# Patient Record
Sex: Female | Born: 1960 | Race: Black or African American | Hispanic: No | Marital: Married | Smoking: Former smoker
Health system: Southern US, Community
[De-identification: ages and names within clinical notes are randomized; demographics above are authoritative.]

## PROBLEM LIST (undated history)

## (undated) DIAGNOSIS — I480 Paroxysmal atrial fibrillation: Secondary | ICD-10-CM

## (undated) DIAGNOSIS — H9319 Tinnitus, unspecified ear: Secondary | ICD-10-CM

## (undated) DIAGNOSIS — E042 Nontoxic multinodular goiter: Secondary | ICD-10-CM

## (undated) DIAGNOSIS — F329 Major depressive disorder, single episode, unspecified: Secondary | ICD-10-CM

## (undated) DIAGNOSIS — Z992 Dependence on renal dialysis: Secondary | ICD-10-CM

## (undated) DIAGNOSIS — F32A Depression, unspecified: Secondary | ICD-10-CM

## (undated) DIAGNOSIS — E785 Hyperlipidemia, unspecified: Secondary | ICD-10-CM

## (undated) DIAGNOSIS — I483 Typical atrial flutter: Secondary | ICD-10-CM

## (undated) DIAGNOSIS — L732 Hidradenitis suppurativa: Secondary | ICD-10-CM

## (undated) DIAGNOSIS — I420 Dilated cardiomyopathy: Secondary | ICD-10-CM

## (undated) DIAGNOSIS — M792 Neuralgia and neuritis, unspecified: Secondary | ICD-10-CM

## (undated) DIAGNOSIS — I4892 Unspecified atrial flutter: Secondary | ICD-10-CM

## (undated) DIAGNOSIS — E039 Hypothyroidism, unspecified: Secondary | ICD-10-CM

## (undated) DIAGNOSIS — D509 Iron deficiency anemia, unspecified: Secondary | ICD-10-CM

## (undated) DIAGNOSIS — Z9289 Personal history of other medical treatment: Secondary | ICD-10-CM

## (undated) DIAGNOSIS — I5032 Chronic diastolic (congestive) heart failure: Secondary | ICD-10-CM

## (undated) DIAGNOSIS — E875 Hyperkalemia: Secondary | ICD-10-CM

## (undated) DIAGNOSIS — N186 End stage renal disease: Secondary | ICD-10-CM

## (undated) DIAGNOSIS — I10 Essential (primary) hypertension: Secondary | ICD-10-CM

## (undated) DIAGNOSIS — N390 Urinary tract infection, site not specified: Secondary | ICD-10-CM

## (undated) DIAGNOSIS — J189 Pneumonia, unspecified organism: Secondary | ICD-10-CM

## (undated) DIAGNOSIS — E119 Type 2 diabetes mellitus without complications: Secondary | ICD-10-CM

## (undated) DIAGNOSIS — G4730 Sleep apnea, unspecified: Secondary | ICD-10-CM

## (undated) DIAGNOSIS — H9190 Unspecified hearing loss, unspecified ear: Secondary | ICD-10-CM

## (undated) DIAGNOSIS — K219 Gastro-esophageal reflux disease without esophagitis: Secondary | ICD-10-CM

## (undated) DIAGNOSIS — I951 Orthostatic hypotension: Secondary | ICD-10-CM

## (undated) DIAGNOSIS — Z8674 Personal history of sudden cardiac arrest: Secondary | ICD-10-CM

## (undated) HISTORY — DX: Neuralgia and neuritis, unspecified: M79.2

## (undated) HISTORY — PX: PORTACATH PLACEMENT: SHX2246

## (undated) HISTORY — DX: Orthostatic hypotension: I95.1

## (undated) HISTORY — DX: End stage renal disease: N18.6

## (undated) HISTORY — DX: Dependence on renal dialysis: Z99.2

## (undated) HISTORY — DX: Unspecified atrial flutter: I48.92

## (undated) HISTORY — DX: Typical atrial flutter: I48.3

## (undated) HISTORY — DX: Chronic diastolic (congestive) heart failure: I50.32

## (undated) HISTORY — PX: PORT-A-CATH REMOVAL: SHX5289

## (undated) HISTORY — DX: Tinnitus, unspecified ear: H93.19

## (undated) HISTORY — DX: Dilated cardiomyopathy: I42.0

## (undated) HISTORY — DX: Nontoxic multinodular goiter: E04.2

## (undated) HISTORY — DX: Paroxysmal atrial fibrillation: I48.0

## (undated) HISTORY — DX: Hyperlipidemia, unspecified: E78.5

---

## 2000-12-17 ENCOUNTER — Other Ambulatory Visit: Admission: RE | Admit: 2000-12-17 | Discharge: 2000-12-17 | Payer: Self-pay | Admitting: Family Medicine

## 2001-04-09 ENCOUNTER — Emergency Department (HOSPITAL_COMMUNITY): Admission: EM | Admit: 2001-04-09 | Discharge: 2001-04-09 | Payer: Self-pay | Admitting: Emergency Medicine

## 2002-02-03 ENCOUNTER — Ambulatory Visit (HOSPITAL_COMMUNITY): Admission: RE | Admit: 2002-02-03 | Discharge: 2002-02-03 | Payer: Self-pay | Admitting: Family Medicine

## 2002-02-03 ENCOUNTER — Encounter: Payer: Self-pay | Admitting: Family Medicine

## 2003-09-11 ENCOUNTER — Other Ambulatory Visit: Admission: RE | Admit: 2003-09-11 | Discharge: 2003-09-11 | Payer: Self-pay | Admitting: Family Medicine

## 2003-12-14 ENCOUNTER — Ambulatory Visit (HOSPITAL_COMMUNITY): Admission: RE | Admit: 2003-12-14 | Discharge: 2003-12-14 | Payer: Self-pay | Admitting: Family Medicine

## 2004-01-07 ENCOUNTER — Encounter (INDEPENDENT_AMBULATORY_CARE_PROVIDER_SITE_OTHER): Payer: Self-pay | Admitting: Specialist

## 2004-01-07 ENCOUNTER — Ambulatory Visit (HOSPITAL_COMMUNITY): Admission: RE | Admit: 2004-01-07 | Discharge: 2004-01-07 | Payer: Self-pay | Admitting: Family Medicine

## 2004-02-03 ENCOUNTER — Ambulatory Visit (HOSPITAL_COMMUNITY): Admission: RE | Admit: 2004-02-03 | Discharge: 2004-02-03 | Payer: Self-pay | Admitting: Family Medicine

## 2004-07-07 ENCOUNTER — Ambulatory Visit (HOSPITAL_COMMUNITY): Admission: RE | Admit: 2004-07-07 | Discharge: 2004-07-07 | Payer: Self-pay | Admitting: Family Medicine

## 2005-06-05 ENCOUNTER — Encounter: Admission: RE | Admit: 2005-06-05 | Discharge: 2005-06-05 | Payer: Self-pay | Admitting: Family Medicine

## 2005-10-10 ENCOUNTER — Other Ambulatory Visit: Admission: RE | Admit: 2005-10-10 | Discharge: 2005-10-10 | Payer: Self-pay | Admitting: Family Medicine

## 2005-12-25 ENCOUNTER — Encounter: Admission: RE | Admit: 2005-12-25 | Discharge: 2005-12-25 | Payer: Self-pay | Admitting: General Surgery

## 2007-07-11 ENCOUNTER — Inpatient Hospital Stay (HOSPITAL_COMMUNITY): Admission: EM | Admit: 2007-07-11 | Discharge: 2007-07-15 | Payer: Self-pay | Admitting: Emergency Medicine

## 2007-09-09 ENCOUNTER — Ambulatory Visit: Payer: Self-pay | Admitting: Hematology & Oncology

## 2008-05-08 ENCOUNTER — Ambulatory Visit: Payer: Self-pay | Admitting: Oncology

## 2008-05-13 ENCOUNTER — Encounter: Admission: RE | Admit: 2008-05-13 | Discharge: 2008-05-13 | Payer: Self-pay | Admitting: Cardiology

## 2008-05-15 ENCOUNTER — Observation Stay (HOSPITAL_COMMUNITY): Admission: EM | Admit: 2008-05-15 | Discharge: 2008-05-16 | Payer: Self-pay | Admitting: Emergency Medicine

## 2008-06-01 LAB — CBC WITH DIFFERENTIAL/PLATELET
Eosinophils Absolute: 0.5 10*3/uL (ref 0.0–0.5)
HCT: 33 % — ABNORMAL LOW (ref 34.8–46.6)
LYMPH%: 6.3 % — ABNORMAL LOW (ref 14.0–48.0)
MONO#: 0.6 10*3/uL (ref 0.1–0.9)
NEUT#: 15.6 10*3/uL — ABNORMAL HIGH (ref 1.5–6.5)
Platelets: 452 10*3/uL — ABNORMAL HIGH (ref 145–400)
RBC: 4.5 10*6/uL (ref 3.70–5.32)
WBC: 17.8 10*3/uL — ABNORMAL HIGH (ref 3.9–10.0)

## 2008-06-03 LAB — COMPREHENSIVE METABOLIC PANEL
Albumin: 3.2 g/dL — ABNORMAL LOW (ref 3.5–5.2)
CO2: 22 mEq/L (ref 19–32)
Glucose, Bld: 231 mg/dL — ABNORMAL HIGH (ref 70–99)
Sodium: 136 mEq/L (ref 135–145)
Total Bilirubin: 0.4 mg/dL (ref 0.3–1.2)
Total Protein: 7.9 g/dL (ref 6.0–8.3)

## 2008-06-03 LAB — SEDIMENTATION RATE: Sed Rate: 104 mm/hr — ABNORMAL HIGH (ref 0–22)

## 2008-06-03 LAB — RETICULOCYTES (CHCC)
ABS Retic: 53.8 10*3/uL (ref 19.0–186.0)
RBC.: 4.48 MIL/uL (ref 3.87–5.11)

## 2008-06-03 LAB — IRON AND TIBC: TIBC: 214 ug/dL — ABNORMAL LOW (ref 250–470)

## 2008-06-03 LAB — LACTATE DEHYDROGENASE: LDH: 122 U/L (ref 94–250)

## 2008-06-03 LAB — FERRITIN: Ferritin: 119 ng/mL (ref 10–291)

## 2009-04-30 ENCOUNTER — Encounter (HOSPITAL_COMMUNITY): Admission: RE | Admit: 2009-04-30 | Discharge: 2009-07-13 | Payer: Self-pay | Admitting: Family Medicine

## 2009-07-27 ENCOUNTER — Encounter: Admission: RE | Admit: 2009-07-27 | Discharge: 2009-07-27 | Payer: Self-pay | Admitting: Gastroenterology

## 2009-08-18 ENCOUNTER — Encounter (HOSPITAL_COMMUNITY): Admission: RE | Admit: 2009-08-18 | Discharge: 2009-11-16 | Payer: Self-pay | Admitting: Family Medicine

## 2009-12-19 ENCOUNTER — Inpatient Hospital Stay (HOSPITAL_COMMUNITY): Admission: EM | Admit: 2009-12-19 | Discharge: 2009-12-25 | Payer: Self-pay | Admitting: Emergency Medicine

## 2009-12-21 ENCOUNTER — Encounter (INDEPENDENT_AMBULATORY_CARE_PROVIDER_SITE_OTHER): Payer: Self-pay | Admitting: Internal Medicine

## 2010-05-18 ENCOUNTER — Encounter (INDEPENDENT_AMBULATORY_CARE_PROVIDER_SITE_OTHER): Payer: Self-pay | Admitting: *Deleted

## 2010-05-18 DIAGNOSIS — K219 Gastro-esophageal reflux disease without esophagitis: Secondary | ICD-10-CM | POA: Insufficient documentation

## 2010-05-18 DIAGNOSIS — E042 Nontoxic multinodular goiter: Secondary | ICD-10-CM

## 2010-05-18 DIAGNOSIS — L732 Hidradenitis suppurativa: Secondary | ICD-10-CM | POA: Insufficient documentation

## 2010-07-27 ENCOUNTER — Encounter: Payer: Self-pay | Admitting: Internal Medicine

## 2010-07-27 ENCOUNTER — Ambulatory Visit: Payer: Self-pay | Admitting: Infectious Disease

## 2010-07-27 DIAGNOSIS — Z872 Personal history of diseases of the skin and subcutaneous tissue: Secondary | ICD-10-CM | POA: Insufficient documentation

## 2010-07-27 DIAGNOSIS — L88 Pyoderma gangrenosum: Secondary | ICD-10-CM

## 2010-07-27 LAB — CONVERTED CEMR LAB
ALT: <8 units/L (ref 0–35)
Alkaline Phosphatase: 93 units/L (ref 39–117)
Basophils Absolute: 0.1 10*3/uL (ref 0.0–0.1)
Basophils Relative: 0 % (ref 0–1)
Creatinine, Ser: 1.25 mg/dL — ABNORMAL HIGH (ref 0.40–1.20)
Eosinophils Absolute: 0.5 10*3/uL (ref 0.0–0.7)
MCHC: 28.8 g/dL — ABNORMAL LOW (ref 30.0–36.0)
MCV: 73.9 fL — ABNORMAL LOW (ref 78.0–100.0)
Neutrophils Relative %: 85 % — ABNORMAL HIGH (ref 43–77)
Platelets: 496 10*3/uL — ABNORMAL HIGH (ref 150–400)
RDW: 19.8 % — ABNORMAL HIGH (ref 11.5–15.5)
Sed Rate: 99 mm/hr — ABNORMAL HIGH (ref 0–22)
Sodium: 138 meq/L (ref 135–145)
Total Bilirubin: 0.7 mg/dL (ref 0.3–1.2)
Total Protein: 7.6 g/dL (ref 6.0–8.3)
WBC: 17.3 10*3/uL — ABNORMAL HIGH (ref 4.0–10.5)

## 2010-08-25 ENCOUNTER — Telehealth: Payer: Self-pay | Admitting: Infectious Disease

## 2010-09-06 ENCOUNTER — Ambulatory Visit: Payer: Self-pay | Admitting: Infectious Disease

## 2010-09-06 DIAGNOSIS — F0630 Mood disorder due to known physiological condition, unspecified: Secondary | ICD-10-CM | POA: Insufficient documentation

## 2010-09-07 ENCOUNTER — Inpatient Hospital Stay (HOSPITAL_COMMUNITY): Admission: AD | Admit: 2010-09-07 | Discharge: 2010-09-19 | Payer: Self-pay | Admitting: Internal Medicine

## 2010-09-08 ENCOUNTER — Ambulatory Visit: Payer: Self-pay | Admitting: Infectious Diseases

## 2010-09-27 ENCOUNTER — Encounter: Payer: Self-pay | Admitting: Infectious Disease

## 2010-10-05 ENCOUNTER — Encounter: Payer: Self-pay | Admitting: Infectious Disease

## 2010-10-13 ENCOUNTER — Ambulatory Visit: Payer: Self-pay | Admitting: Infectious Disease

## 2010-10-13 LAB — CONVERTED CEMR LAB
CO2: 24 meq/L (ref 19–32)
Eosinophils Absolute: 0.4 10*3/uL (ref 0.0–0.7)
Eosinophils Relative: 3 % (ref 0–5)
Glucose, Bld: 75 mg/dL (ref 70–99)
HCT: 35.5 % — ABNORMAL LOW (ref 36.0–46.0)
HIV: NONREACTIVE
Hemoglobin: 10 g/dL — ABNORMAL LOW (ref 12.0–15.0)
Lymphocytes Relative: 5 % — ABNORMAL LOW (ref 12–46)
Lymphs Abs: 0.6 10*3/uL — ABNORMAL LOW (ref 0.7–4.0)
MCV: 83.5 fL (ref 78.0–100.0)
Monocytes Absolute: 0.7 10*3/uL (ref 0.1–1.0)
Platelets: 293 10*3/uL (ref 150–400)
Potassium: 4.2 meq/L (ref 3.5–5.3)
RDW: 22.8 % — ABNORMAL HIGH (ref 11.5–15.5)
Sodium: 140 meq/L (ref 135–145)
WBC: 11.8 10*3/uL — ABNORMAL HIGH (ref 4.0–10.5)

## 2010-10-14 ENCOUNTER — Encounter: Payer: Self-pay | Admitting: Infectious Disease

## 2010-11-10 ENCOUNTER — Ambulatory Visit: Payer: Self-pay | Admitting: Infectious Disease

## 2010-11-10 ENCOUNTER — Encounter: Payer: Self-pay | Admitting: Infectious Disease

## 2010-11-10 ENCOUNTER — Inpatient Hospital Stay (HOSPITAL_COMMUNITY)
Admission: EM | Admit: 2010-11-10 | Discharge: 2010-11-14 | Payer: Self-pay | Source: Home / Self Care | Attending: Internal Medicine | Admitting: Internal Medicine

## 2010-11-10 DIAGNOSIS — B965 Pseudomonas (aeruginosa) (mallei) (pseudomallei) as the cause of diseases classified elsewhere: Secondary | ICD-10-CM

## 2010-11-10 LAB — CONVERTED CEMR LAB
AST: 33 units/L (ref 0–37)
Albumin: 3.3 g/dL — ABNORMAL LOW (ref 3.5–5.2)
Alkaline Phosphatase: 225 units/L — ABNORMAL HIGH (ref 39–117)
BUN: 106 mg/dL — ABNORMAL HIGH (ref 6–23)
Basophils Absolute: 0 10*3/uL (ref 0.0–0.1)
Basophils Relative: 0 % (ref 0–1)
Eosinophils Absolute: 0.3 10*3/uL (ref 0.0–0.7)
Eosinophils Relative: 2 % (ref 0–5)
Hemoglobin: 11.9 g/dL — ABNORMAL LOW (ref 12.0–15.0)
MCHC: 29.9 g/dL — ABNORMAL LOW (ref 30.0–36.0)
MCV: 79.9 fL (ref 78.0–100.0)
Monocytes Absolute: 0.6 10*3/uL (ref 0.1–1.0)
Neutro Abs: 13.7 10*3/uL — ABNORMAL HIGH (ref 1.7–7.7)
Potassium: 7.3 meq/L (ref 3.5–5.3)
RDW: 20 % — ABNORMAL HIGH (ref 11.5–15.5)
Sodium: 131 meq/L — ABNORMAL LOW (ref 135–145)

## 2010-11-11 ENCOUNTER — Telehealth (INDEPENDENT_AMBULATORY_CARE_PROVIDER_SITE_OTHER): Payer: Self-pay | Admitting: *Deleted

## 2010-11-11 ENCOUNTER — Encounter: Payer: Self-pay | Admitting: Infectious Disease

## 2010-11-28 ENCOUNTER — Encounter: Payer: Self-pay | Admitting: Infectious Disease

## 2010-11-30 ENCOUNTER — Encounter: Payer: Self-pay | Admitting: Infectious Disease

## 2010-12-01 ENCOUNTER — Telehealth: Payer: Self-pay | Admitting: Infectious Disease

## 2010-12-05 ENCOUNTER — Telehealth: Payer: Self-pay | Admitting: Infectious Disease

## 2010-12-06 ENCOUNTER — Encounter: Payer: Self-pay | Admitting: Infectious Disease

## 2010-12-07 ENCOUNTER — Encounter: Payer: Self-pay | Admitting: Infectious Disease

## 2010-12-07 ENCOUNTER — Ambulatory Visit
Admission: RE | Admit: 2010-12-07 | Discharge: 2010-12-07 | Payer: Self-pay | Source: Home / Self Care | Attending: Infectious Disease | Admitting: Infectious Disease

## 2010-12-17 ENCOUNTER — Encounter: Payer: Self-pay | Admitting: Family Medicine

## 2010-12-17 ENCOUNTER — Encounter: Payer: Self-pay | Admitting: Cardiology

## 2010-12-18 ENCOUNTER — Encounter: Payer: Self-pay | Admitting: Orthopedic Surgery

## 2010-12-18 ENCOUNTER — Encounter: Payer: Self-pay | Admitting: Cardiology

## 2010-12-18 ENCOUNTER — Encounter: Payer: Self-pay | Admitting: Family Medicine

## 2010-12-20 ENCOUNTER — Encounter: Payer: Self-pay | Admitting: Infectious Disease

## 2010-12-21 ENCOUNTER — Encounter: Payer: Self-pay | Admitting: Infectious Disease

## 2010-12-29 NOTE — Miscellaneous (Signed)
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 Added new problem of GOITER, MULTINODULAR (ICD-241.1) Added new problem of PURE HYPERCHOLESTEROLEMIA (ICD-272.0) Added new problem of CHF (ICD-428.0) Added new problem of GERD (ICD-530.81) Added new problem of HIDRADENITIS SUPPURATIVA (ICD-705.83) Added new problem of ANEMIA, IRON DEFICIENCY (ICD-280.9) Medications: Added new medication of HUMALOG MIX 75/25 75-25 % SUSP (INSULIN LISPRO PROT & LISPRO) Give 35 units subq every AM and 20 units sub q every PM per PCP Added new medication of QUINAPRIL HCL 40 MG TABS (QUINAPRIL HCL) Take 1 tablet by mouth once a day per PCP Allergies: Added new allergy or adverse reaction of AUGMENTIN (AMOXICILLIN-POT CLAVULANATE) Added new allergy or adverse reaction of AVANDIA (ROSIGLITAZONE MALEATE) Observations: Added new observation of NKA: F (05/18/2010 12:25)

## 2010-12-29 NOTE — Progress Notes (Signed)
Summary: pt. in hospital  Phone Note Call from Patient   Caller: Patient Summary of Call: Pt. called from hospital wanting Dr. Tommy Medal to call on her cell [phone].  Did not return call because pt. is inpatient Initial call taken by: Myrtis Hopping CMA Deborra Medina),  November 11, 2010 4:50 PM     Appended Document: pt. in hospital I spoke to pt on Saturday in house

## 2010-12-29 NOTE — Assessment & Plan Note (Signed)
Summary: per [name] f/u [mkj]   Visit Type:  Follow-up Referring Provider:  Harlan Stains Primary Provider:  Flonnie Overman  CC:  follow-up visit, hydrodenitis, 63 lb. weight loss in 1 month, c/o antibiotic not working, and vaginal and buttock pain.  History of Present Illness: 8 you AA lady with Hydradenitis suppurativa, morbid obesity, pyoderma gangrenosum and CHF. I recently admitted her to [facility] for Iv antiibiotics and diuresis. She improved dramatically at [facility] and was ultimately dc on vancomycin and ertapenem (latter chose after she grew E. coli that was resistant to AMPICILLIn,  AMPICILLIN/SULBACTAM:I to  CEFAZOLIN: S to cefepime, ceftazidie, ceftriaxone, R to gent, s to imipenem, R to TMP, S to cefoxitin. She continued to improve 2 weeks after stopping hte IV antibiotics she has worsening swelling in her right inguinal area. that is painful and draining purulent material back side. I swabbed pus from one of these lesions and gave her bactrim and then cipro when I grew pseudomonas. She has faield to improve on this therapy . She has developed new lesions on her buttocsk, her groin bilaterally and her pannus. She has continued to lose weight--now to a total of 60+#. She has increased drainage of blood onto her depends  Pt cell phone [phone]  Preventive Screening-Counseling & Management  Alcohol-Tobacco     Alcohol drinks/day: 0     Smoking Status: never  Caffeine-Diet-Exercise     Caffeine use/day: tea and sodas     Does Patient Exercise: no  Safety-Violence-Falls     Seat Belt Use: yes   Current Allergies (reviewed today): ! AUGMENTIN (AMOXICILLIN-POT CLAVULANATE) ! AVANDIA (ROSIGLITAZONE MALEATE) Past History:  Past Medical History: Last updated: 07/27/2010 Morbid obesity diabetes Multinodular goiter Hyperlipidemia Cardiomyopathy IDA Gout E nodosum ? Pyoderma gangrenosum  Past Surgical History: Last updated: 07/27/2010 resection of cyst from  buttocks  Family History: Last updated: 07/27/2010 Mom with HTN, CAD, CABG   Social History: Last updated: 10/13/2010 former smoker, her husband is HIV positive apparently well controlled per pt. 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 Give 35 Units Subq Every Am and 20 Units Sub Q Every Pm Per Pcp 2)  Quinapril Hcl 40 Mg Tabs (Quinapril Hcl) .... Take 1 Tablet By Mouth Once A Day Per Pcp 3)  Percocet 5-325 Mg Tabs (Oxycodone-Acetaminophen) .... Take 1 Take Tablet By Mouth Every 6 Hours 4)  Synthroid 50 Mcg Tabs (Levothyroxine Sodium) .... Take 1 Tab By Mouth Each Morning On An Empty Stomach 30 Minutes Before Meal 5)  Cymbalta 60 Mg Cpep (Duloxetine Hcl) .... Take 1 Capsule By Mouth  Once A Day 6)  Pravastatin Sodium 40 Mg Tabs (Pravastatin Sodium) .... Take 1 Tablet By Mouth Once A Day 7)  Hydrocodone-Acetaminophen 5-500 Mg Tabs (Hydrocodone-Acetaminophen) .... Take 1-2 Tablets By Mouth Every 6 Hours For Pain 8)  Aspirin 81 Mg Tabs (Aspirin) ....  Take 1 Tablet By Mouth Once A Day 9)  Magnesium Oxide 400 Mg Tabs (Magnesium Oxide) .... Take 1 Tablet By Mouth Three Times A Day 10)  Ferrous Sulfate 325 (65 Fe) Mg Tabs (Ferrous Sulfate) .... Take 1 Tablet By Mouth Twice A Day 11)  Cyclobenzaprine Hcl 10 Mg Tabs (Cyclobenzaprine Hcl) .... Take 1 Tablet By Mouth Three Times A Day As Needed For Spasms 12)  Furosemide 20 Mg Tabs (Furosemide) .... Take 1 Tablet By Mouth Twice A Day 13)  Coreg 3.125 Mg Tabs (Carvedilol) .... Take 1 Tablet By Mouth With Food Twice A Day 14)  Spironolactone 50 Mg Tabs (Spironolactone) .... Take 1 Tablet By Mouth Daily 15)  Klor-Con M20 20 Meq Cr-Tabs (Potassium Chloride Crys Cr) .... Take 1 Tablet By Mouth 30 Meq Daily 16)  Bactrim Ds 800-160 Mg Tabs (Sulfamethoxazole-Trimethoprim) .... Take 1 Tablet By Mouth Two Times A Day 17)  Ciprofloxacin Hcl 500 Mg Tabs (Ciprofloxacin Hcl) .... Take 1 Tablet By Mouth Two Times A Day  Allergies: 1)  ! Augmentin (Amoxicillin-Pot Clavulanate) 2)  ! Avandia (Rosiglitazone Maleate)   Family History: Reviewed history from 07/27/2010 and no changes required. Mom with HTN, CAD, CABG   Social History: Reviewed history from 10/13/2010 and no changes required. former smoker, her husband is HIV positive apparently well controlled per pt. pt has tested negative mx times no sex x 2 yrs  Review of Systems       The patient complains of suspicious skin lesions and abnormal bleeding.  The patient denies anorexia, fever, weight loss, weight gain, vision loss, decreased hearing, hoarseness, chest pain, syncope, dyspnea on exertion, peripheral edema, prolonged cough, headaches, hemoptysis, abdominal pain, melena, hematochezia, severe indigestion/heartburn, hematuria, incontinence, genital sores, muscle weakness, transient blindness, difficulty walking, depression, unusual weight change, and enlarged lymph nodes.    Vital Signs:  Patient profile:   50 year old female Height:      67  inches (170.18 cm) Weight:      268.2 pounds (121.91 kg) BMI:     42.16 Temp:     98.1 degrees F (36.72 degrees C) oral Pulse rate:   80 / minute BP sitting:   121 / 73  (left arm)  Vitals Entered By: Myrtis Hopping CMA Deborra Medina) (November 10, 2010 3:42 PM) CC: follow-up visit, hydrodenitis, 63 lb. weight loss in 1 month, c/o antibiotic not working, vaginal and buttock pain Is Patient Diabetic? Yes Did you bring your meter with you today? No Pain Assessment Patient in pain? yes     Location: buttocks Intensity: 10 Type: burning Onset of pain  Constant Nutritional Status BMI of > 30 = obese Nutritional Status Detail appetite "good"  Have you ever been in a relationship where you felt threatened, hurt or afraid?Unable to ask   Does patient need assistance? Functional Status Self care Ambulation Normal Comments no missed doses of meds per pt.   Physical Exam  General:  alert, well-developed, and overweight-appearing.  but has lost substantial amoutn of weight Head:  normocephalic and atraumatic.   Eyes:  vision grossly intact, pupils equal, and pupils round.   Ears:  no external deformities and ear piercing(s) noted.   Nose:  no external  deformity and no nasal discharge.   Mouth:  no dental plaque, pharynx pink and moist, and no erythema.   Lungs:  normal respiratory effort.  distant breath sounds Heart:  rrr distant heart sounds Abdomen:  morbidly obese, she now has pannus hanging down in more confluent manner Msk:  no joint abnormalities Extremities:  1+ left pedal edema.   Neurologic:  alert & oriented X3, strength normal in all extremities, and gait normal.  dysphoric Skin:  paitent has multiple nodular tender scarring in axilla, and along folds of panni, scarrig of bilateral breasts with some exudate. She has purulent dischage and skin thickening from her groin bilateraly and from butocks and from pannus that hangs more loosely. She has multiple loose folds of sking  due to her profound weight loss Psych:  Oriented X3 in relativly good spirits   Impression & Recommendations:  Problem # 1:  HIDRADENITIS SUPPURATIVA (ICD-705.83) Having infectious flare unresponsive to surgery. WIll put her n vancomycin and meropenem. I took swab from her pannus that was weeping pus. I will giver her at least 3 weeks of IV therapy. She needs to be connected with a Psychologist, sport and exercise  and a Psychiatric nurse. She can contineu her diuresis provided she does not become dehydrated and prerenal Orders: T-Comprehensive Metabolic Panel ([id number]) T-CBC w/Diff [id number]) T-Culture, Wound ([id number]) Surgical Referral (Surgery) Surgical Referral (Surgery) Est. Patient Level IV [id number])  Problem # 2:  MORBID OBESITY (ICD-278.01)  she has lost 60 Plus pounds largely from losing water weight but also some fat possible with diet  Orders: Est. Patient Level IV [id number])  Problem # 3:  CHF (ICD-428.0)  mcuh of her weight turned out to be due to edema. Would benefit from repat TTE if not done recently The following medications were removed from the medication list:    Furosemide 20 Mg Tabs (Furosemide) .Marland Kitchen... Take 1 tablet by mouth twice a day Her updated medication list for this problem includes:    Quinapril Hcl 40 Mg Tabs (Quinapril hcl) .Marland Kitchen... Take 1 tablet by mouth once a day per pcp    Aspirin 81 Mg Tabs (Aspirin) .Marland Kitchen... Take 1 tablet by mouth once a day    Coreg 3.125 Mg Tabs (Carvedilol) .Marland Kitchen... Take 1 tablet by mouth with food twice a day    Spironolactone 50 Mg Tabs (Spironolactone) .Marland Kitchen... Take 1 tablet by mouth daily    Furosemide 40 Mg Tabs (Furosemide) .Marland Kitchen... Take two tablets  twice a day  Orders: Est. Patient Level IV [id number])  Problem # 4:  PSEUDOMONAS INFECTION (ICD-041.7)  see above she grew this from most recent cutlrue  Orders: Est. Patient Level IV [id number])  Medications Added to Medication List This Visit: 1)  Furosemide 40 Mg Tabs (Furosemide) .... Take two  tablets  twice a day  Patient Instructions: 1)  We will culture your abdominal wound 2)  We will check blood work 3)  We will start IV vancomycin and meropenem 4)  I would recommend your having a general surgeon/plastic surgeon to assist in your long term care

## 2010-12-29 NOTE — Assessment & Plan Note (Signed)
Summary: fu/from hosp visit/kam    Current Allergies: ! AUGMENTIN (AMOXICILLIN-POT CLAVULANATE) ! AVANDIA (ROSIGLITAZONE MALEATE)

## 2010-12-29 NOTE — Miscellaneous (Signed)
Summary: [facility]: Orders   [facility]: Orders   Imported By: Bonner Puna 11/15/2010 14:43:52  _____________________________________________________________________  External Attachment:    Type:   Image     Comment:   External Document

## 2010-12-29 NOTE — Assessment & Plan Note (Signed)
Summary: HFU/OK PER DR VAN DAM/VS   Visit Type:  Follow-up Referring Provider:  Harlan Stains Primary Provider:  Flonnie Overman  CC:  hsfu and .  History of Present Illness: 50 you AA lady with Hydradenitis suppurativa, morbid obesity, pyoderma gangrenosum and CHF. I recently admitted her to [facility] for Iv antiibiotics and diuresis. She improved dramatically at [facility] and was ultimately dc on vancomycin and ertapenem (latter chose after she grew E. coli that was resistant to AMPICILLIn,  AMPICILLIN/SULBACTAM:I to  CEFAZOLIN: S to cefepime, ceftazidie, ceftriaxone, R to gent, s to imipenem, R to TMP, S to cefoxitin). She had lost60+ pounds with aggressive diureisis but developed prerenal failure and hyperkalemia. She had developed new lesions on her pannus and buttocks and I cultured pannus and grew E coli AMPICILLIN  , gent, I tobra, R cipro, TMPSMX, S to carbapenems, rocephin, ceftaz, cefepime. WHen she was admitted or her renal faliure, I had picc line placed at dc and she was rx for more than 3 wks with IV vanco and imipenem. She has contijued to improve on IV antibiotics and currently has had near resolution of induration on her breasts and dramatic improvement in lesions on pannus and groin. Her surveillance labs had shown hyperkalemia and I had her stop her ACEI and her aldactone. Today she requests extension of her IV antibiotis.  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 Give 35 Units Subq Every Am and 20 Units Sub Q Every Pm Per Pcp 2)  Percocet 5-325 Mg Tabs (Oxycodone-Acetaminophen) .... Take 1 Take Tablet By Mouth Every 6 Hours 3)  Synthroid 50 Mcg Tabs (Levothyroxine Sodium) .... Take 1 Tab By Mouth Each Morning On An Empty Stomach 30 Minutes Before Meal 4)  Cymbalta 60 Mg Cpep (Duloxetine Hcl) .... Take 1 Capsule By Mouth Once A Day 5)  Pravastatin Sodium 40 Mg Tabs (Pravastatin Sodium) .... Take 1 Tablet By Mouth Once A Day 6)  Hydrocodone-Acetaminophen 5-500 Mg Tabs (Hydrocodone-Acetaminophen) .... Take 1-2 Tablets By Mouth Every 6 Hours For Pain 7)  Aspirin 81 Mg Tabs (Aspirin) .... Take 1 Tablet By Mouth Once A Day 8)  Magnesium Oxide 400 Mg Tabs (Magnesium Oxide) .... Take 1 Tablet By Mouth Three Times A Day 9)  Ferrous Sulfate 325 (65 Fe) Mg Tabs (Ferrous Sulfate) .... Take 1 Tablet By Mouth Twice A Day 10)  Cyclobenzaprine Hcl 10 Mg Tabs (Cyclobenzaprine Hcl) .... Take 1 Tablet By Mouth Three Times A Day As Needed For Spasms 11)  Coreg 3.125 Mg Tabs (Carvedilol) .... Take 1 Tablet By Mouth With Food Twice A Day 12)  Spironolactone 50 Mg Tabs (Spironolactone) .... Take 1 Tablet By Mouth Daily 13)  Furosemide 40 Mg Tabs (Furosemide) .... Take Two Tablets  Twice A Day  Current Medications (verified): 1)  Humalog Mix 75/25 75-25 % Susp (Insulin Lispro Prot & Lispro) .... Give 35 Units Subq Every Am and 20 Units Sub Q Every Pm Per Pcp 2)  Percocet 5-325 Mg Tabs (Oxycodone-Acetaminophen) .... Take 1 Take Tablet By Mouth Every 6 Hours 3)  Synthroid 50 Mcg Tabs (Levothyroxine Sodium) .... Take 1 Tab By Mouth Each Morning On An Empty Stomach 30  Minutes Before Meal 4)  Cymbalta 60 Mg Cpep (Duloxetine Hcl) .... Take 1 Capsule By Mouth Once A Day 5)  Pravastatin Sodium 40 Mg Tabs (Pravastatin Sodium) .... Take 1 Tablet By Mouth Once A Day 6)  Hydrocodone-Acetaminophen 5-500 Mg Tabs (Hydrocodone-Acetaminophen) .... Take 1-2 Tablets By Mouth Every 6 Hours For Pain 7)  Aspirin 81 Mg Tabs (Aspirin) .... Take 1 Tablet By Mouth Once A Day 8)  Magnesium Oxide 400 Mg Tabs (Magnesium Oxide) .... Take 1 Tablet By Mouth Three Times A Day 9)  Ferrous Sulfate 325 (65 Fe) Mg Tabs (Ferrous Sulfate) .... Take 1 Tablet By Mouth Twice A Day 10)  Cyclobenzaprine Hcl 10 Mg Tabs (Cyclobenzaprine Hcl) .... Take 1 Tablet By Mouth Three Times A Day As Needed For Spasms 11)  Coreg 3.125 Mg Tabs (Carvedilol) .... Take 1 Tablet By Mouth With Food Twice A Day 12)  Furosemide 40 Mg Tabs (Furosemide) .... Take Two Tablets  Twice A Day 13)  Cefpodoxime Proxetil 200 Mg Tabs (Cefpodoxime Proxetil) .... Two Tablets Twice Daily For One Month 14)  Doxycycline Hyclate 100 Mg Tabs (Doxycycline Hyclate) .... Take 1 Tablet By Mouth Two Times A Day 15)  Yasmin 28 3-0.03 Mg Tabs (Drospirenone-Ethinyl Estradiol) .... Take 1 Tablet By Mouth Once A Day 16)  Vancomycin Hcl 1000 Mg Solr (Vancomycin Hcl) 17)  Meropenem 1 Gm Solr (Meropenem)  Allergies: 1)  ! Augmentin (Amoxicillin-Pot Clavulanate) 2)  ! Avandia (Rosiglitazone Maleate)    Current Allergies (reviewed today): ! AUGMENTIN (AMOXICILLIN-POT CLAVULANATE) ! AVANDIA (ROSIGLITAZONE MALEATE) Past History:  Past Medical History: Last updated: 07/27/2010 Morbid obesity diabetes Multinodular goiter Hyperlipidemia Cardiomyopathy IDA Gout E nodosum ? Pyoderma gangrenosum  Past Surgical History: Last updated: 07/27/2010 resection of cyst from buttocks  Family History: Last updated: 07/27/2010 Mom with HTN, CAD, CABG   Social History: Last updated: 10/13/2010 former smoker, her husband is HIV positive  apparently well controlled per pt. pt has tested negative mx times no sex x 2 yrs  Risk Factors: Alcohol Use: 0 (11/10/2010) Caffeine Use: tea and sodas (11/10/2010) Exercise: no (11/10/2010)  Risk Factors: Smoking Status: never (11/10/2010)  Review of Systems       The patient complains of suspicious skin lesions.  The patient denies anorexia, fever, weight loss, weight gain, vision loss, decreased hearing, hoarseness, chest pain, syncope, dyspnea on exertion, peripheral edema, prolonged cough, headaches, hemoptysis, abdominal pain, melena, hematochezia, severe indigestion/heartburn, hematuria, incontinence, genital sores, muscle weakness, transient blindness, difficulty walking, depression, unusual weight change, abnormal bleeding, enlarged lymph nodes, and angioedema.    Vital Signs:  Patient profile:   50 year old female Height:      67 inches (170.18 cm) Weight:      284 pounds (129.09 kg) BMI:     44.64 Temp:     98.6 degrees F (37.00 degrees C) oral Pulse rate:   66 / minute BP sitting:   110 / 69  (left arm)  Vitals Entered By: Jarrett Ables CMA (December 07, 2010 10:11 AM) CC: hsfu,  Is Patient Diabetic? Yes Did you bring your meter with you today? No Pain Assessment Patient in pain? no      Nutritional Status BMI of > 30 = obese Nutritional Status Detail nl  Does patient need assistance? Functional Status Self care Ambulation Normal   Physical Exam  General:  alert, well-developed, and overweight-appearing.  but has lost substantial amoutn of weight Head:  normocephalic and atraumatic.   Eyes:  vision grossly intact, pupils equal, and pupils round.   Ears:  no external deformities and ear piercing(s) noted.   Nose:  no external deformity and no nasal discharge.   Mouth:  no dental plaque, pharynx pink and moist, and no erythema.   Lungs:  normal respiratory effort.  distant breath sounds Heart:  rrr distant heart sounds Abdomen:  morbidly obese, she now  has pannus hanging down in more confluent manner Msk:  no joint abnormalities Neurologic:  alert & oriented X3, strength normal in all extremities, and gait normal.  dysphoric Skin:  paitent has multiple nodular tender scarring in axilla, and along she has scarring in groin and buttocks, pannus now has much less expresible exudate.  Psych:  Oriented X3 in relativly good spirits   Impression & Recommendations:  Problem # 1:  HIDRADENITIS SUPPURATIVA (ICD-705.83)  continue the IV antibiotics an additional 2 weeks and then will start oral cephalosporin 3rd gen and doxycyline. I have suggested yasmin, continued zinc and dietary changes.   Orders: Est. Patient Level IV [id number])  Problem # 2:  HYPERKALEMIA (ICD-276.7) hold aldactone and acei adn rechecke k today. May give her few extra doses of lasix to try to bring it down futher. COunseled re lower k diett Orders: T-Potassium  [id number]) Est. Patient Level IV [id number])  Problem # 3:  CHF (ICD-428.0)  has lost tremendous amt of weight with diuresis. I think might benefit from CHF MD  The following medications were removed from the medication list:    Spironolactone 50 Mg Tabs (Spironolactone) .Marland Kitchen... Take 1 tablet by mouth daily Her updated medication list for this problem includes:    Aspirin 81 Mg Tabs (Aspirin) .Marland Kitchen... Take 1 tablet by mouth once a day    Coreg 3.125 Mg Tabs (Carvedilol) .Marland Kitchen... Take 1 tablet by mouth with food twice a day    Furosemide 40 Mg Tabs (Furosemide) .Marland Kitchen... Take two tablets  twice a day  Orders: Est. Patient Level IV [id number])  Medications Added to Medication List This Visit: 1)  Cefpodoxime Proxetil 200 Mg Tabs (Cefpodoxime proxetil) .... Two tablets twice daily for one month 2)  Doxycycline Hyclate 100 Mg Tabs (Doxycycline hyclate) .... Take 1 tablet by mouth two times a day 3)  Yasmin 28 3-0.03 Mg Tabs (Drospirenone-ethinyl estradiol) .... Take 1 tablet by mouth once a day 4)  Vancomycin Hcl 1000 Mg Solr  (Vancomycin hcl) 5)  Meropenem 1 Gm Solr (Meropenem)  Patient Instructions: 1)  we will call [facility] to extend your antibiotics another 2 weeks, then pull picc and change to  2)  oral antibiotics 3)  you can consider a lactos free diet or low carbohydrate diet such as paleolithic diet 4)  [email or web address] 5)  [email or web address] are two websites re this 6)  rtc 5wks to see Dr Tommy Medal Prescriptions: YASMIN 28 3-0.03 MG TABS (DROSPIRENONE-ETHINYL ESTRADIOL) Take 1 tablet by mouth once a day  #30 x 11   Entered by:   Jarrett Ables CMA   Authorized by:   Roderic Scarce  Tommy Medal MD   Signed by:   Jarrett Ables CMA on 12/07/2010   Method used:   Electronically to        [facility] [address] [phone]* (retail)       [address]       [place], [place]  [address]       Ph: [phone] or [phone]       Fax: [phone]   RxID:   [id number] YASMIN 28 3-0.03 MG TABS (DROSPIRENONE-ETHINYL ESTRADIOL) Take 1 tablet by mouth once a day  #30 x 11   Entered and Authorized by:   Alcide Evener MD   Signed by:   Rhina Brackett Dam MD on 12/07/2010   Method used:   Print then Give to Patient   RxID:   [id number] DOXYCYCLINE HYCLATE 100 MG TABS (DOXYCYCLINE HYCLATE) Take 1 tablet by mouth two times a day  #60 x 2   Entered and Authorized by:   Alcide Evener MD   Signed by:   Rhina Brackett Dam MD on 12/07/2010   Method used:   Print then Give to Patient   RxID:   [id number] CEFPODOXIME PROXETIL 200 MG TABS (CEFPODOXIME PROXETIL) two tablets twice daily for one month  #120 x 2   Entered and Authorized by:   Alcide Evener MD   Signed by:   Rhina Brackett Dam MD on 12/07/2010   Method used:   Print then Give to Patient   RxID:   [id number]

## 2010-12-29 NOTE — Medication Information (Signed)
Summary: [facility]:RX  [facility]:RX   Imported By: Bonner Puna 12/20/2010 16:25:27  _____________________________________________________________________  External Attachment:    Type:   Image     Comment:   External Document

## 2010-12-29 NOTE — Progress Notes (Signed)
Summary: Request for wound culture results  Phone Note Call from Patient   Caller: [phone] Summary of Call: wound culture results Orland Mustard RN  August 25, 2010 2:41 PM   Follow-up for Phone Call        We didnt grow any specific organisms, so no guide by culture for therapy. I don't know if we ever got records from her dermatologist at [facility]. When is she scheduled to see me again? Follow-up by: Alcide Evener MD,  August 26, 2010 9:10 AM

## 2010-12-29 NOTE — Assessment & Plan Note (Signed)
Summary: Office Visit (Infectious Disease)    Referring Provider:  Harlan Stains Primary Provider:  Flonnie Overman  CC:  hsfu.  History of Present Illness: 50 you AA lady with Hydradenitis suppurativa, morbid obesity, pyoderma gangrenosum and CHF. I recently admitted her to [facility] for Iv antiibiotics and diuresis. She improved dramatically at [facility] and was ultimately dc on vancomycin and ertapenem (latter chose after she grew E. coli that was resistant to AMPICILLIn,  AMPICILLIN/SULBACTAM:I to  CEFAZOLIN: S to cefepime, ceftazidie, ceftriaxone, R to gent, s to imipenem, R to TMP, S to cefoxitin. She continued to improve. Now 2 weeks after stopping hte IV antibiotics she has worsening swelling in her right inguinal area. that is painful and draining purulent material. Pt cell phone [phone]  Problems Prior to Update: 1)  Problems Related To High-risk Sexual Behavior  (ICD-V69.2) 2)  Mood Disorder in Conditions Classified Elsewhere  (ICD-293.83) 3)  Morbid Obesity  (ICD-278.01) 4)  Erythema Nodosum, Hx of  (ICD-V13.3) 5)  Pyoderma Gangrenosum  (ICD-686.01) 6)  Anemia, Iron Deficiency  (ICD-280.9) 7)  Hidradenitis Suppurativa  (ICD-705.83) 8)  Gerd  (ICD-530.81) 9)  CHF  (ICD-428.0) 10)  Pure Hypercholesterolemia  (ICD-272.0) 11)  Goiter, Multinodular  (ICD-241.1) 12)  Diabetes Mellitus, Type I, With Complications  (Q000111Q) 13)  Hypertension  (ICD-401.9) 14)  Morbid Obesity  (ICD-278.01)  Medications Prior to Update: 1)  Humalog Mix 75/25 75-25 % Susp (Insulin Lispro Prot & Lispro) .... Give 35 Units Subq Every Am and 20 Units Sub Q Every Pm Per Pcp 2)  Quinapril Hcl 40 Mg Tabs (Quinapril Hcl) .... Take 1 Tablet By Mouth Once A Day Per Pcp 3)  Clindamycin Hcl 300 Mg Caps (Clindamycin Hcl) .... Take 1 Capsule By Mouth Two Times A Day For 3 Weeks 4)  Rifampin 300 Mg Caps (Rifampin) .... Take One Tablet Twice Daily 5)  Percocet 5-325 Mg Tabs (Oxycodone-Acetaminophen) .... Take 1  Take Tablet By Mouth Every 6 Hours 6)  Synthroid 50 Mcg Tabs (Levothyroxine Sodium) .... Take 1 Tab By Mouth Each Morning On An Empty Stomach 30 Minutes Before Meal 7)  Cymbalta 60 Mg Cpep (Duloxetine Hcl) .... Take 1 Capsule By Mouth Once A Day 8)  Pravastatin Sodium 40 Mg Tabs (Pravastatin Sodium) .... Take 1 Tablet By Mouth Once A Day 9)  Cefdinir 300 Mg Caps (Cefdinir) .... Take 1 Capsule By Mouth Every 12 Hours 10)  Hydrocodone-Acetaminophen 5-500 Mg Tabs (Hydrocodone-Acetaminophen) .... Take 1-2 Tablets By Mouth Every 6 Hours For Pain 11)  Aspirin 81 Mg Tabs (Aspirin) .... Take 1 Tablet By Mouth Once A Day 12)  Magnesium Oxide 400 Mg Tabs (Magnesium Oxide) .... Take 1 Tablet By Mouth Three Times A Day 13)  Ferrous Sulfate 325 (65 Fe) Mg Tabs (Ferrous Sulfate) .... Take 1 Tablet By Mouth Twice A Day 14)  Cyclobenzaprine Hcl 10 Mg Tabs (Cyclobenzaprine Hcl) .... Take 1 Tablet By Mouth Three Times A Day As Needed For Spasms 15)  Furosemide 20 Mg Tabs (Furosemide) .... Take 1 Tablet By Mouth Twice A Day 16)  Coreg 3.125 Mg Tabs (Carvedilol) .... Take 1 Tablet By Mouth With Food Twice A Day 17)  Spironolactone 50 Mg Tabs (Spironolactone) .... Take 1 Tablet By Mouth Daily 18)  Klor-Con M20 20 Meq Cr-Tabs (Potassium Chloride Crys Cr) .... Take 1 Tablet By Mouth 30 Meq Daily  Current Medications (verified): 1)  Humalog Mix 75/25 75-25 % Susp (Insulin Lispro Prot & Lispro) .... Give 35 Units Subq  Every Am and 20 Units Sub Q Every Pm Per Pcp 2)  Quinapril Hcl 40 Mg Tabs (Quinapril Hcl) .... Take 1 Tablet By Mouth Once A Day Per Pcp 3)  Clindamycin Hcl 300 Mg Caps (Clindamycin Hcl) .... Take 1 Capsule By Mouth Two Times A Day For 3 Weeks 4)  Rifampin 300 Mg Caps (Rifampin) .... Take One Tablet Twice Daily 5)  Percocet 5-325 Mg Tabs (Oxycodone-Acetaminophen) .... Take 1 Take Tablet By Mouth Every 6 Hours 6)  Synthroid 50 Mcg Tabs (Levothyroxine Sodium) .... Take 1 Tab By Mouth Each Morning On An Empty  Stomach 30 Minutes Before Meal 7)  Cymbalta 60 Mg Cpep (Duloxetine Hcl) .... Take 1 Capsule By Mouth Once A Day 8)  Pravastatin Sodium 40 Mg Tabs (Pravastatin Sodium) .... Take 1 Tablet By Mouth Once A Day 9)  Hydrocodone-Acetaminophen 5-500 Mg Tabs (Hydrocodone-Acetaminophen) .... Take 1-2 Tablets By Mouth Every 6 Hours For Pain 10)  Aspirin 81 Mg Tabs (Aspirin) .... Take 1 Tablet By Mouth Once A Day 11)  Magnesium Oxide 400 Mg Tabs (Magnesium Oxide) .... Take 1 Tablet By Mouth Three Times A Day 12)  Ferrous Sulfate 325 (65 Fe) Mg Tabs (Ferrous Sulfate) .... Take 1 Tablet By Mouth Twice A Day 13)  Cyclobenzaprine Hcl 10 Mg Tabs (Cyclobenzaprine Hcl) .... Take 1 Tablet By Mouth Three Times A Day As Needed For Spasms 14)  Furosemide 20 Mg Tabs (Furosemide) .... Take 1 Tablet By Mouth Twice A Day 15)  Coreg 3.125 Mg Tabs (Carvedilol) .... Take 1 Tablet By Mouth With Food Twice A Day 16)  Spironolactone 50 Mg Tabs (Spironolactone) .... Take 1 Tablet By Mouth Daily 17)  Klor-Con M20 20 Meq Cr-Tabs (Potassium Chloride Crys Cr) .... Take 1 Tablet By Mouth 30 Meq Daily 18)  Bactrim Ds 800-160 Mg Tabs (Sulfamethoxazole-Trimethoprim) .... Take 1 Tablet By Mouth Two Times A Day 19)  Cefpodoxime Proxetil 200 Mg Tabs (Cefpodoxime Proxetil) .... Two Tablets Twice Daily  Allergies: 1)  ! Augmentin (Amoxicillin-Pot Clavulanate) 2)  ! Avandia (Rosiglitazone Maleate)    Current Allergies (reviewed today): ! AUGMENTIN (AMOXICILLIN-POT CLAVULANATE) ! AVANDIA (ROSIGLITAZONE MALEATE) Past History:  Past Medical History: Last updated: 07/27/2010 Morbid obesity diabetes Multinodular goiter Hyperlipidemia Cardiomyopathy IDA Gout E nodosum ? Pyoderma gangrenosum  Past Surgical History: Last updated: 07/27/2010 resection of cyst from buttocks  Family History: Last updated: 07/27/2010 Mom with HTN, CAD, CABG   Social History: Last updated: 10/13/2010 former smoker, her husband is HIV positive  apparently well controlled per pt. pt has tested negative mx times no sex x 2 yrs  Risk Factors: Alcohol Use: 0 (09/06/2010) Caffeine Use: tea and sodas (09/06/2010) Exercise: no (09/06/2010)  Risk Factors: Smoking Status: never (09/06/2010)  Family History: Reviewed history from 07/27/2010 and no changes required. Mom with HTN, CAD, CABG   Social History: Reviewed history from 09/06/2010 and no changes required. former smoker, her husband is HIV positive apparently well controlled per pt. pt has tested negative mx times no sex x 2 yrs  Review of Systems       The patient complains of suspicious skin lesions.  The patient denies anorexia, fever, weight loss, weight gain, vision loss, decreased hearing, hoarseness, chest pain, syncope, dyspnea on exertion, peripheral edema, prolonged cough, headaches, hemoptysis, abdominal pain, melena, hematochezia, severe indigestion/heartburn, hematuria, incontinence, genital sores, muscle weakness, transient blindness, difficulty walking, depression, unusual weight change, abnormal bleeding, and enlarged lymph nodes.    Vital Signs:  Patient profile:   49  year old female Height:      67 inches (170.18 cm) Weight:      331 pounds (150.45 kg) BMI:     52.03 Temp:     98.8 degrees F (37.11 degrees C) oral Pulse rate:   96 / minute BP sitting:   149 / 86  (left arm)  Vitals Entered By: Jarrett Ables CMA (October 13, 2010 9:18 AM) CC: hsfu Is Patient Diabetic? Yes Pain Assessment Patient in pain? yes     Location: groin Intensity: 6 Type: aching Nutritional Status BMI of > 30 = obese  Does patient need assistance? Functional Status Self care Ambulation Impaired:Risk for fall   Physical Exam  General:  alert, well-developed, and overweight-appearing.  but has lost substantial amoutn of weight Head:  normocephalic and atraumatic.   Eyes:  vision grossly intact, pupils equal, and pupils round.   Ears:  no external deformities and  ear piercing(s) noted.   Nose:  no external deformity and no nasal discharge.   Mouth:  no dental plaque, pharynx pink and moist, and no erythema.   Lungs:  normal respiratory effort.  distant breath sounds Heart:  rrr distant heart sounds Abdomen:  morbidly obese Msk:  no joint abnormalities Extremities:  1+ left pedal edema.   Neurologic:  alert & oriented X3, strength normal in all extremities, and gait normal.  dysphoric Skin:  paitent has multiple nodular tender scarring in axilla, and along folds of panni, with scarring of soft tissue in perirectal and along labia. bilateral breasts with improved appearance still with some  thickening. She has now purulent discharge with pressure from indurated edmematous area in right groin left groin also erythematous and swollen Psych:  Oriented X3 in relativly good spirits   Impression & Recommendations:  Problem # 1:  HIDRADENITIS SUPPURATIVA (ICD-705.83) Assessment Comment Only cultured area in right groin. Will give her cefpodixime and bactrim rx for month. if lesion worsens may try PICC and IV antibiotics and or referral to [facility]. I think removal of sweat glands oculd be beneficial. Not clear that she truly has Pyoderma which would complicate this Orders: T-Basic Metabolic Panel ([id number]) T-CBC w/Diff [phone]) T-Culture, Wound ([id number]) Est. Patient Level IV [id number])  Problem # 2:  PYODERMA GANGRENOSUM (ICD-686.01)  see above.  Orders: Est. Patient Level IV [id number])  Problem # 3:  PROBLEMS RELATED TO HIGH-RISK SEXUAL BEHAVIOR (ICD-V69.2) high risk equals husband with HIV, will recheck her Orders: T-HIV Antibody  (Reflex) [id number]) Est. Patient Level IV [id number])  Medications Added to Medication List This Visit: 1)  Bactrim Ds 800-160 Mg Tabs (Sulfamethoxazole-trimethoprim) .... Take 1 tablet by mouth two times a day 2)  Cefpodoxime Proxetil 200 Mg Tabs (Cefpodoxime proxetil) .... Two tablets twice daily  Patient  Instructions: 1)  rtc in week before Christmas Prescriptions: CEFPODOXIME PROXETIL 200 MG TABS (CEFPODOXIME PROXETIL) two tablets twice daily  #120 x 2   Entered and Authorized by:   Alcide Evener MD   Signed by:   Rhina Brackett Dam MD on 10/13/2010   Method used:   Electronically to        [facility] [address] [phone]* (retail)       [place] [address]       [place], [place]  [address]       Ph: [phone] or [phone]       Fax: [phone]   RxID:   [id number] BACTRIM DS 800-160 MG TABS (SULFAMETHOXAZOLE-TRIMETHOPRIM) Take  1 tablet by mouth two times a day  #60 x 2   Entered and Authorized by:   Alcide Evener MD   Signed by:   Rhina Brackett Dam MD on 10/13/2010   Method used:   [facility]        [facility] [address] [phone]* (retail)       [address]       [place], [place]  [address]       Ph: [phone] or [phone]       Fax: [phone]   RxID:   [id number]

## 2010-12-29 NOTE — Progress Notes (Signed)
Summary: Care Plan Oversight  Phone Note Outgoing Call   Call placed by: Alcide Evener MD,  December 05, 2010 9:34 AM Details for Reason: Care Plan Oversight Summary of Call: [phone](> 60 mins) I have supervised home care and/or infusion therapy for this pt, including providing orders for care, review of labs and/or home health care plans, communicating with the home health care professionals and/or patient/caregivers to integrate current information into the medical treatment plan and/or adjust the medical therapy. This supervision has been provided for _65__minutes during the calendar month. Dates for this oversight December 19th, 2011 thru January 18th, 2012  Treatment of hidradenitis suppurativa ' Initial call taken by: Alcide Evener MD,  December 05, 2010 9:34 AM

## 2010-12-29 NOTE — Progress Notes (Signed)
Summary: appt. work in  Phone Note Call from Patient   Summary of Call: Patient wanted to be seen earlier, and after speaking with Dr. Tommy Medal he said she could get worked in on his schedule for 12/07/2010 or 12/14/2010. I called and left the patient a message to let us know which appointment she wanted. Initial call taken by: Jarrett Ables CMA,  December 01, 2010 2:52 PM     Appended Document: appt. work in  K was 5.5 .asked her to stop her quinapril for now

## 2010-12-29 NOTE — Assessment & Plan Note (Signed)
Summary: F/U OV/PER DR VAN DAM/VS   Visit Type:  Follow-up Referring Provider:  Harlan Stains Primary Provider:  Flonnie Overman  CC:  follow-up visit.  History of Present Illness: 50 you AA lady with Hydradenitis suppurativa, morbid obesity, pyoderma gangrenosum and CHF. I saw her in ID clinic in August at that time she was suffering from chonic purulent drainage from sinus tracts in her perineal area as well as from her left breast which was engorged, and under her axilla. She had been seen by Dr. Hal Neer at [facility] and had also been seen  by Gen S[facility] at [facility] for consideration of gastric bypass (I learned today--but this was abandoned due to concerns about wound healing and infeciton per the pt. When I saw her in ID clinic I took samples from 5-6 sites where there was copious fould smelling purulent drainage. All of these cultures yielded mixed organism with none growing MRSA, MSSA or GAS. I gave her course of rifampin and clindamycin but she has not at all improved.Since last being seen both breasts have become engorged and are continually draining. THe lesions on her buttocks and perineum are drianing foul smelling mmaterial aswell. Furthermore luher  CHF has worsened and she has increasing retention of fluid and c/o DOE. I spent greater than an hour with this pt including greater than 50% of tme face to face consultaion and coordination of care. I called PA from [facility] and arranged admission to [facility] where I will have my partner TIm Orene Desanctis take a look at this pt.  Pt cell phone [phone]  Preventive Screening-Counseling & Management  Alcohol-Tobacco     Alcohol drinks/day: 0     Smoking Status: never  Caffeine-Diet-Exercise     Caffeine use/day: tea and sodas     Does Patient Exercise: no   Current Allergies (reviewed today): ! AUGMENTIN (AMOXICILLIN-POT CLAVULANATE) ! AVANDIA (ROSIGLITAZONE MALEATE) Past History:  Past Medical History: Last updated: 07/27/2010 Morbid  obesity diabetes Multinodular goiter Hyperlipidemia Cardiomyopathy IDA Gout E nodosum ? Pyoderma gangrenosum  Past Surgical History: Last updated: 07/27/2010 resection of cyst from buttocks  Family History: Last updated: 07/27/2010 Mom with HTN, CAD, CABG   Social History: Last updated: 09/06/2010 former smoker, there is a hand written note from Dr. Megan Salon in packet that mentions her husband being HIV positive.  Risk Factors: Alcohol Use: 0 (09/06/2010) Caffeine Use: tea and sodas (09/06/2010) Exercise: no (09/06/2010)  Risk Factors: Smoking Status: never (09/06/2010)  Problems Prior to Update: 1)  Morbid Obesity  (ICD-278.01) 2)  Erythema Nodosum, Hx of  (ICD-V13.3) 3)  Pyoderma Gangrenosum  (ICD-686.01) 4)  Anemia, Iron Deficiency  (ICD-280.9) 5)  Hidradenitis Suppurativa  (ICD-705.83) 6)  Gerd  (ICD-530.81) 7)  CHF  (ICD-428.0) 8)  Pure Hypercholesterolemia  (ICD-272.0) 9)  Goiter, Multinodular  (ICD-241.1) 10)  Diabetes Mellitus, Type I, With Complications  (Q000111Q) 11)  Hypertension  (ICD-401.9) 12)  Morbid Obesity  (ICD-278.01)  Medications Prior to Update: 1)  Humalog Mix 75/25 75-25 % Susp (Insulin Lispro Prot & Lispro) .... Give 35 Units Subq Every Am and 20 Units Sub Q Every Pm Per Pcp 2)  Quinapril Hcl 40 Mg Tabs (Quinapril Hcl) .... Take 1 Tablet By Mouth Once A Day Per Pcp 3)  Clindamycin Hcl 300 Mg Caps (Clindamycin Hcl) .... Take 1 Capsule By Mouth Two Times A Day For 3 Weeks 4)  Rifampin 300 Mg Caps (Rifampin) .... Take One Tablet Twice Daily 5)  Percocet 5-325 Mg Tabs (Oxycodone-Acetaminophen) .Marland KitchenMarland KitchenMarland Kitchen  Take 1 Take Tablet By Mouth Every 6 Hours 6)  Synthroid 50 Mcg Tabs (Levothyroxine Sodium) .... Take 1 Tab By Mouth Each Morning On An Empty Stomach 30 Minutes Before Meal 7)  Cymbalta 60 Mg Cpep (Duloxetine Hcl) .... Take 1 Capsule By Mouth Once A Day 8)  Pravastatin Sodium 40 Mg Tabs (Pravastatin Sodium) .... Take 1 Tablet By Mouth Once A Day 9)   Cefdinir 300 Mg Caps (Cefdinir) .... Take 1 Capsule By Mouth Every 12 Hours 10)  Hydrocodone-Acetaminophen 5-500 Mg Tabs (Hydrocodone-Acetaminophen) .... Take 1-2 Tablets By Mouth Every 6 Hours For Pain 11)  Aspirin 81 Mg Tabs (Aspirin) .... Take 1 Tablet By Mouth Once A Day 12)  Magnesium Oxide 400 Mg Tabs (Magnesium Oxide) .... Take 1 Tablet By Mouth Three Times A Day 13)  Ferrous Sulfate 325 (65 Fe) Mg Tabs (Ferrous Sulfate) .... Take 1 Tablet By Mouth Twice A Day 14)  Cyclobenzaprine Hcl 10 Mg Tabs (Cyclobenzaprine Hcl) .... Take 1 Tablet By Mouth Three Times A Day As Needed For Spasms 15)  Furosemide 20 Mg Tabs (Furosemide) .... Take 1 Tablet By Mouth Twice A Day 16)  Coreg 3.125 Mg Tabs (Carvedilol) .... Take 1 Tablet By Mouth With Food Twice A Day 17)  Spironolactone 50 Mg Tabs (Spironolactone) .... Take 1 Tablet By Mouth Daily 18)  Klor-Con M20 20 Meq Cr-Tabs (Potassium Chloride Crys Cr) .... Take 1 Tablet By Mouth 30 Meq Daily  Current Medications (verified): 1)  Humalog Mix 75/25 75-25 % Susp (Insulin Lispro Prot & Lispro) .... Give 35 Units Subq Every Am and 20 Units Sub Q Every Pm Per Pcp 2)  Quinapril Hcl 40 Mg Tabs (Quinapril Hcl) .... Take 1 Tablet By Mouth Once A Day Per Pcp 3)  Clindamycin Hcl 300 Mg Caps (Clindamycin Hcl) .... Take 1 Capsule By Mouth Two Times A Day For 3 Weeks 4)  Rifampin 300 Mg Caps (Rifampin) .... Take One Tablet Twice Daily 5)  Percocet 5-325 Mg Tabs (Oxycodone-Acetaminophen) .... Take 1 Take Tablet By Mouth Every 6 Hours 6)  Synthroid 50 Mcg Tabs (Levothyroxine Sodium) .... Take 1 Tab By Mouth Each Morning On An Empty Stomach 30 Minutes Before Meal 7)  Cymbalta 60 Mg Cpep (Duloxetine Hcl) .... Take 1 Capsule By Mouth Once A Day 8)  Pravastatin Sodium 40 Mg Tabs (Pravastatin Sodium) .... Take 1 Tablet By Mouth Once A Day 9)  Cefdinir 300 Mg Caps (Cefdinir) .... Take 1 Capsule By Mouth Every 12 Hours 10)  Hydrocodone-Acetaminophen 5-500 Mg Tabs  (Hydrocodone-Acetaminophen) .... Take 1-2 Tablets By Mouth Every 6 Hours For Pain 11)  Aspirin 81 Mg Tabs (Aspirin) .... Take 1 Tablet By Mouth Once A Day 12)  Magnesium Oxide 400 Mg Tabs (Magnesium Oxide) .... Take 1 Tablet By Mouth Three Times A Day 13)  Ferrous Sulfate 325 (65 Fe) Mg Tabs (Ferrous Sulfate) .... Take 1 Tablet By Mouth Twice A Day 14)  Cyclobenzaprine Hcl 10 Mg Tabs (Cyclobenzaprine Hcl) .... Take 1 Tablet By Mouth Three Times A Day As Needed For Spasms 15)  Furosemide 20 Mg Tabs (Furosemide) .... Take 1 Tablet By Mouth Twice A Day 16)  Coreg 3.125 Mg Tabs (Carvedilol) .... Take 1 Tablet By Mouth With Food Twice A Day 17)  Spironolactone 50 Mg Tabs (Spironolactone) .... Take 1 Tablet By Mouth Daily 18)  Klor-Con M20 20 Meq Cr-Tabs (Potassium Chloride Crys Cr) .... Take 1 Tablet By Mouth 30 Meq Daily  Allergies: 1)  ! Augmentin (  Amoxicillin-Pot Clavulanate) 2)  ! Avandia (Rosiglitazone Maleate)   Family History: Reviewed history from 07/27/2010 and no changes required. Mom with HTN, CAD, CABG   Social History: Reviewed history from 07/27/2010 and no changes required. former smoker, there is a hand written note from Dr. Megan Salon in packet that mentions her husband being HIV positive.  Review of Systems       The patient complains of anorexia, weight gain, dyspnea on exertion, peripheral edema, hemoptysis, muscle weakness, suspicious skin lesions, and abnormal bleeding.         and as in hpi otherwise 12 pt review positive for depression and otherwise negative.  Vital Signs:  Patient profile:   50 year old female Height:      67 inches (170.18 cm) Weight:      0 pounds  Vitals Entered By: [facility]) (September 06, 2010 11:04 AM) CC: follow-up visit Pain Assessment Patient in pain? yes     Location: buttocks Intensity: 8 Type: burning Onset of pain  Constant Nutritional Status Detail appetite is okay per patient  Does patient need  assistance? Functional Status Self care Ambulation Normal   Physical Exam  General:  alert, well-developed, and overweight-appearing.   Head:  normocephalic and atraumatic.   Eyes:  vision grossly intact, pupils equal, and pupils round.   Ears:  no external deformities and ear piercing(s) noted.   Nose:  no external deformity and no nasal discharge.   Mouth:  no dental plaque, pharynx pink and moist, and no erythema.   Lungs:  normal respiratory effort.  distant breath sounds Heart:  rrr distant heart sounds Abdomen:  morbidly obese Msk:  no joint abnormalities Extremities:  2+ left pedal edema and 2+ right pedal edema.   Neurologic:  alert & oriented X3, strength normal in all extremities, and gait normal.  dysphoric Skin:  paitent has multiple nodular tender scarring in axilla, and along folds of panni, with scarring of soft tissue in perirectal and along labia. There is drainage form right axilla, bilateral breasts with thickened peau d'orange skin inferiorly tender  with mikly discharge,  patient also with multiple lesions in from buttocks and from perineial area. Perineal area is foul smelling Psych:  Oriented X3,tearful,   Impression & Recommendations:  Problem # 1:  HIDRADENITIS SUPPURATIVA (ICD-705.83) I am going to admit her to [facility] for diuresis for her CHF and IV antibiotics with vancomycin and unasyn. S Orders: Est. Patient Level V [id number])  Problem # 2:  PROBLEMS RELATED TO HIGH-RISK SEXUAL BEHAVIOR (ICD-V69.2) there is mentione in Dr. Chilton Greathouse note sof husband being HIV positive, Will check HIV ab and RNA wiht admission labs tomorrow   Orders: Est. Patient Level V [id number])  Problem # 3:  CHF (ICD-428.0) needs IV Lasix aggressive diuresis Her updated medication list for this problem includes:    Quinapril Hcl 40 Mg Tabs (Quinapril hcl) .Marland Kitchen... Take 1 tablet by mouth once a day per pcp    Aspirin 81 Mg Tabs (Aspirin) .Marland Kitchen... Take 1 tablet by mouth once a day     Furosemide 20 Mg Tabs (Furosemide) .Marland Kitchen... Take 1 tablet by mouth twice a day    Coreg 3.125 Mg Tabs (Carvedilol) .Marland Kitchen... Take 1 tablet by mouth with food twice a day    Spironolactone 50 Mg Tabs (Spironolactone) .Marland Kitchen... Take 1 tablet by mouth daily  Orders: Est. Patient Level V [id number])  Problem # 4:  PYODERMA GANGRENOSUM (ICD-686.01) hx of this seen on path and aso reaction to  I adn D on buttocks Orders: Est. Patient Level V [id number])  Problem # 5:  MORBID OBESITY (ICD-278.01) he really would benefit from Bariatric surgery if it could be safely performed. Certainly weight loss would help her predicament but a difficult battle for her Orders: Est. Patient Level V [id number])  Problem # 6:  ANEMIA, IRON DEFICIENCY (ICD-280.9) she claims part of this is from bleeding from her HS lesions   Her updated medication list for this problem includes:    Ferrous Sulfate 325 (65 Fe) Mg Tabs (Ferrous sulfate) .Marland Kitchen... Take 1 tablet by mouth twice a day

## 2010-12-29 NOTE — Assessment & Plan Note (Signed)
Summary: new pt hydradenitis/records in clinic   Visit Type:  Consult Referring Provider:  Harlan Stains Primary Provider:  Hardie Pulley  CC:  new patient hydradenitis.  History of Present Illness: 27 you AA lady who carries diagnosis of Hydradenitis suppurativa andwho has been afflcted witht this since her youth. In the past few years she has had worsening symptoms of drainage of purulent material from axillas bilaterally, perineal regions, buttocks and folds of her breasts and panni. She apparently developed severe reaction to I and D of buttocks with pyoderma gangrenosoium like reaction. She has been seen by Dr. Hal Neer with [facility] and by [facility]. She has skin biopsy of her leg showing findings c/w erythema nodosum.  She has received varoius courses of antibitics including doxycyline, rifampin, augmentin, most recently cefdinir, systemic corticosteroids (prednsione for short courses of therapy). She claims little if any of these have made significant impact. She suffers chronic drainage of purluent material and bloody materilal from sinus tracts. She has to wear a diaper daily to contain all of the dripping blood adn pus. She apparently is anemic because of this. She is on aldactone but this has made little difference in her symptoms. She has not to my knowledge had any intralesional injections of steroids or systemic therapy with agents such as infliximab. I spent greater than 45 minutes with this pt including greater than 50% of time with face to face consultation and coordination of care.  Preventive Screening-Counseling & Management  Alcohol-Tobacco     Alcohol drinks/day: 0     Smoking Status: never  Caffeine-Diet-Exercise     Caffeine use/day: tea and sodas     Does Patient Exercise: no  Safety-Violence-Falls     Seat Belt Use: yes   Current Allergies (reviewed today): ! AUGMENTIN (AMOXICILLIN-POT CLAVULANATE) ! AVANDIA (ROSIGLITAZONE MALEATE) Past  History:  Family History: Last updated: 07/27/2010 Mom with HTN, CAD, CABG   Social History: Last updated: 07/27/2010 former smoker, there is a hand written note from Dr. 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 Megan Salon in packet that mentions her husband being HIV positive--I did not notice this until her visit was past  Review of Systems       The patient complains of suspicious skin lesions, depression, and enlarged lymph nodes.  The patient denies anorexia, fever, weight loss, weight gain, vision loss, decreased hearing, hoarseness, chest pain, syncope, dyspnea on exertion, peripheral edema, prolonged cough, headaches, hemoptysis, abdominal pain, melena, hematochezia, severe indigestion/heartburn, hematuria, incontinence, genital sores, muscle weakness, transient blindness, difficulty walking, unusual weight change, and abnormal bleeding.    Vital Signs:  Patient profile:   50 year old female Height:      67 inches (170.18 cm) Weight:      359 pounds (163.18 kg) BMI:     56.43 Temp:     98.4 degrees F (36.89 degrees C) oral Pulse rate:   108 / minute BP sitting:   172 / 121  (left arm)  Vitals Entered By: Nevin Bloodgood  [facility]) (July 27, 2010 3:10 PM) CC: new patient hydradenitis Pain Assessment Patient in pain? yes     Location: body aches Intensity: 10 Type: aching Onset of pain  Constant Nutritional Status BMI of > 30 =  obese Nutritional Status Detail appetite is fine per patient  Does patient need assistance? Functional Status Self care Ambulation Normal   Physical Exam  General:  alert, well-developed, and overweight-appearing.   Head:  normocephalic and atraumatic.   Eyes:  vision grossly intact, pupils equal, and pupils round.   Ears:  no external deformities and ear piercing(s) noted.   Nose:  no external deformity and no nasal discharge.   Mouth:  no dental plaque, pharynx pink and moist, and no erythema.   Lungs:  normal respiratory effort.  distant breath sounds Heart:  rrr distant heart sounds Abdomen:  morbidly obese Rectal:  see skin Genitalia:  se skin Msk:  no joint abnormalities Extremities:  2+ left pedal edema and 2+ right pedal edema.   Neurologic:  alert & oriented X3, strength normal in all extremities, and gait normal.  dysphoric Skin:  paitent has multiple nodular tender scarring in axilla, and along folds of panni, with scarring of soft tissue in perirectal and along labia. There is drainage form right axilla, bilateral breasts with mikly discharge, from buttocks and from perineial area. Perineal area is foul smelling Axillary Nodes:  scarring in axilla bilaterally Psych:  Oriented X3, normally interactive, and dysphoric affect.     Impression & Recommendations:  Problem # 1:  HIDRADENITIS SUPPURATIVA ([id number]) This certanly fits her clinical picture. Her stage is III and severe. I would favor her beingworkup and car (taht could be faciliated by her being  hospitalized for example) at a tertiary care center where she could be seen by a multidisciplinary team of ID, Dermatologists, Plastic Surgeons, Rheumatologist and others who collectively could try to find optimal plan for her. Certainly wone could see if systemic antibiotics, steroids might benefit her in short term. I am reluctant to refer her to General Surgery without understanding all of her pathology and obvously her   hx of apparent PG would complicate ANY surgery. I think her disease is sufficient that treatment with infliximab and other immunomodulatory drugs would be of benefit to her (these are not drugs however that I can prescribe from ID standpoint). I will check a few labs today (should have retested for HIV given what I found in Dr. Chilton Greathouse handwritten note in her packet. I wll get records from [facility] and see if they in cojuntion with [facility] MDs I know or alternatively [facility] MDs from my days as fellow could help me coordinate a multidisciplinary plan for her. I will incrase her aldactone if her K is not terribly high Orders: T-Culture, Wound (87070/87205-70190) T-Culture, Wound (87070/[id number]) T-Culture, Wound (87070/[id number]) T-Culture, Wound (87070/[id number]) T-Culture, Wound (87070/[id number]) T-Culture, Wound (87070/[id number]) T-Comprehensive Metabolic Panel ([id number]) T-CBC w/Diff [id number]) T-Antinuclear Antib (ANA) [id number]) T-Sed Rate (Automated) [id number]) T-Rheumatoid Factor [id number]) Consultation Level V [id number])  Problem # 2:  ERYTHEMA NODOSUM, HX OF (ICD-V13.3)  Being followed by Dermatology at [facility] and her locally. 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 I wonder if her PG could be navigated to pursue bariatric surgery. Again all the more reaon for referral to tertiary care center  Orders: Consultation Level V [phone])  Medications Added to Medication List This Visit: 1)  Clindamycin Hcl 300 Mg Caps (Clindamycin hcl) .... Take 1 capsule by mouth two times a day for 3 weeks 2)  Rifampin  300 Mg Caps (Rifampin) .... Take one tablet twice daily  Patient Instructions: 1)  I thiink we needa multidisciplinary approach and you may benefit from admission to tertiary hospital for visits from dermatology, ID, andplastic surgery 2)  In interim I will give you twice daily clindamycin and twice daily rifampin.  3)  I may increase your spironolactone 4)  Rtc to see Dr. Tommy Medal in October (please block out one hour for pt)( Prescriptions: RIFAMPIN 300 MG CAPS (RIFAMPIN) take one tablet twice daily  #42 x 1   Entered and Authorized by:   Alcide Evener MD   Signed by:   Rhina Brackett Dam MD on 07/27/2010   Method used:   Print then Give to Patient   RxID:   [id number] CLINDAMYCIN HCL 300 MG CAPS (CLINDAMYCIN HCL) Take 1 capsule by mouth two times a day for 3 weeks  #42 x 11   Entered and Authorized by:   Alcide Evener MD   Signed by:   Rhina Brackett Dam MD on 07/27/2010   Method used:   Print then Give to Patient   RxID:   [id number]   Appended Document: new pt hydradenitis/records in clinic    Clinical Lists Changes  Medications: Added new medication of PERCOCET 5-325 MG TABS (OXYCODONE-ACETAMINOPHEN) take 1 take tablet by mouth every 6 hours Added new medication of SYNTHROID 50 MCG TABS (LEVOTHYROXINE SODIUM) take 1 tab by mouth each morning on an empty stomach 30 minutes before meal Added new medication of CYMBALTA 60 MG CPEP (DULOXETINE HCL) take 1 capsule by mouth once a day Added new medication of PRAVASTATIN SODIUM 40 MG TABS (PRAVASTATIN SODIUM) take 1 tablet by mouth once a day Added new medication of CEFDINIR 300 MG CAPS (CEFDINIR) take 1 capsule by mouth every 12 hours Added new medication of HYDROCODONE-ACETAMINOPHEN 5-500 MG TABS (HYDROCODONE-ACETAMINOPHEN) take 1-2 tablets by mouth every 6 hours for pain Added new medication of ASPIRIN 81 MG TABS (ASPIRIN) take 1 tablet by mouth once a day Added new medication of MAGNESIUM OXIDE 400 MG TABS  (MAGNESIUM OXIDE) take 1 tablet by mouth three times a day Added new medication of FERROUS SULFATE 325 (65 FE) MG TABS (FERROUS SULFATE) take 1 tablet by mouth twice a day Added new medication of CYCLOBENZAPRINE HCL 10 MG TABS (CYCLOBENZAPRINE HCL) take 1 tablet by mouth three times a day as needed for spasms Added new medication of FUROSEMIDE 20 MG TABS (FUROSEMIDE) take 1 tablet by mouth twice a day Added new medication of COREG 3.125 MG TABS (CARVEDILOL) take 1 tablet by mouth with food twice a day Added new medication of SPIRONOLACTONE 50 MG TABS (SPIRONOLACTONE) take 1 tablet by mouth daily Added new medication of KLOR-CON M20 20 MEQ CR-TABS (POTASSIUM CHLORIDE CRYS CR) take 1 tablet by mouth 30 meq daily

## 2011-01-04 ENCOUNTER — Encounter: Payer: Self-pay | Admitting: Infectious Disease

## 2011-01-20 ENCOUNTER — Encounter: Payer: Self-pay | Admitting: Infectious Disease

## 2011-01-23 ENCOUNTER — Encounter: Payer: Self-pay | Admitting: Infectious Disease

## 2011-01-23 ENCOUNTER — Encounter: Payer: Self-pay | Admitting: Infectious Diseases

## 2011-01-23 ENCOUNTER — Ambulatory Visit (INDEPENDENT_AMBULATORY_CARE_PROVIDER_SITE_OTHER): Payer: BC Managed Care – PPO | Admitting: Infectious Disease

## 2011-01-23 DIAGNOSIS — F0630 Mood disorder due to known physiological condition, unspecified: Secondary | ICD-10-CM

## 2011-01-23 DIAGNOSIS — L732 Hidradenitis suppurativa: Secondary | ICD-10-CM

## 2011-01-23 DIAGNOSIS — I509 Heart failure, unspecified: Secondary | ICD-10-CM

## 2011-01-23 LAB — CONVERTED CEMR LAB
AST: 16 units/L (ref 0–37)
Alkaline Phosphatase: 176 units/L — ABNORMAL HIGH (ref 39–117)
BUN: 69 mg/dL — ABNORMAL HIGH (ref 6–23)
Basophils Absolute: 0 10*3/uL (ref 0.0–0.1)
Basophils Relative: 0 % (ref 0–1)
Creatinine, Ser: 1.65 mg/dL — ABNORMAL HIGH (ref 0.40–1.20)
Eosinophils Relative: 3 % (ref 0–5)
HCT: 32.1 % — ABNORMAL LOW (ref 36.0–46.0)
Hemoglobin: 9.6 g/dL — ABNORMAL LOW (ref 12.0–15.0)
MCHC: 29.9 g/dL — ABNORMAL LOW (ref 30.0–36.0)
MCV: 82.9 fL (ref 78.0–100.0)
Monocytes Absolute: 0.9 10*3/uL (ref 0.1–1.0)
Monocytes Relative: 6 % (ref 3–12)
Potassium: 4.8 meq/L (ref 3.5–5.3)
RBC: 3.87 M/uL (ref 3.87–5.11)
RDW: 18.3 % — ABNORMAL HIGH (ref 11.5–15.5)
Total Bilirubin: 0.5 mg/dL (ref 0.3–1.2)

## 2011-01-24 NOTE — Miscellaneous (Signed)
Summary: [facility]: Orders  [facility]: Orders   Imported By: Bonner Puna 01/18/2011 15:44:05  _____________________________________________________________________  External Attachment:    Type:   Image     Comment:   External Document

## 2011-01-31 ENCOUNTER — Telehealth: Payer: Self-pay | Admitting: Infectious Diseases

## 2011-02-02 NOTE — Assessment & Plan Note (Signed)
Summary: F/U /OV/OK PER TAMIKA/VS   Vital Signs:  Patient profile:   50 year old female Height:      67 inches (170.18 cm) Weight:      293.0 pounds (133.18 kg) BMI:     46.06 Temp:     98.1 degrees F (36.72 degrees C) oral Pulse rate:   76 / minute BP sitting:   132 / 82  (left arm)  Vitals Entered By: Myrtis Hopping CMA [facility]ra Medina) (January 23, 2011 2:06 PM) CC: pt. c/o flareup of abcesses Is Patient Diabetic? Yes Did you bring your meter with you today? No Pain Assessment Patient in pain? yes     Location: groin and side Intensity: 10 Type: inflammation Onset of pain  Constant Nutritional Status BMI of > 30 = obese Nutritional Status Detail appetite "fine"  Have you ever been in a relationship where you felt threatened, hurt or afraid?Unable to ask   Does patient need assistance? Functional Status Self care Ambulation Normal   Visit Type:  Follow-up Referring Provider:  Harlan Stains Primary Provider:  Flonnie Overman  CC:  pt. c/o flareup of abcesses.  History of Present Illness: 61 you AA lady with Hydradenitis suppurativa, morbid obesity, and CHF. I had admitted her to [facility] for Iv antiibiotics and diuresis a few months ago.. She improved dramatically at [facility] and was ultimately dc on vancomycin and ertapenem (latter chose after she grew E. coli that was resistant to AMPICILLIn,  AMPICILLIN/SULBACTAM:I to  CEFAZOLIN: S to cefepime, ceftazidie, ceftriaxone, R to gent, s to imipenem, R to TMP, S to cefoxitin). She had lost60+ pounds with aggressive diureisis but developed prerenal failure and hyperkalemia. She had developed new lesions on her pannus and buttocks and I cultured pannus and grew E coli AMPICILLIN  , gent, I tobra, R cipro, TMPSMX, S to carbapenems, rocephin, ceftaz, cefepime. WHen she was admitted or her renal faliure, I had picc line placed at dc and she was rx for more than 3 wks with IV vanco and imipenem. I  switched to oral doxycyline and  cefdinir and unfortunately within a week to 2 weeks her  HS was flaring again on her groin, vagina, inside of thighs back of legs. It has continued to progress since then. She again broke down in tears today in clinic. She endorsed depression but denies suicidal ideation or homicidial ideation and contracted for safety. We decided to go with oral bactrim and cipro for now and then if this fails I can admit her again for IV antibiotics--though she knows that this wil not be a permanent solution. We again discussed referral to a plastic surgeon for sweat gland removal.  45 minutes spent with pt including greater than 50% of time in face to face consultation. 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 Give 35 Units Subq Every Am and 20 Units Sub Q Every Pm Per Pcp 2)  Percocet 5-325 Mg Tabs (Oxycodone-Acetaminophen) .... Take  1 Take Tablet By Mouth Every 6 Hours 3)  Synthroid 50 Mcg Tabs (Levothyroxine Sodium) .... Take 1 Tab By Mouth Each Morning On An Empty Stomach 30 Minutes Before Meal 4)  Cymbalta 60 Mg Cpep (Duloxetine Hcl) .... Take 1 Capsule By Mouth Once A Day 5)  Pravastatin Sodium 40 Mg Tabs (Pravastatin Sodium) .... Take 1 Tablet By Mouth Once A Day 6)  Hydrocodone-Acetaminophen 5-500 Mg Tabs (Hydrocodone-Acetaminophen) .... Take 1-2 Tablets By Mouth Every 6 Hours For Pain 7)  Aspirin 81 Mg Tabs (Aspirin) .... Take 1 Tablet By Mouth Once A Day 8)  Magnesium Oxide 400 Mg Tabs (Magnesium Oxide) .... Take 1 Tablet By Mouth Three Times A Day 9)  Ferrous Sulfate 325 (65 Fe) Mg Tabs (Ferrous Sulfate) .... Take 1 Tablet By Mouth Twice A Day 10)  Cyclobenzaprine Hcl 10 Mg Tabs (Cyclobenzaprine Hcl) .... Take 1 Tablet By Mouth Three Times A Day As Needed For Spasms 11)  Coreg 3.125 Mg Tabs (Carvedilol) .... Take 1 Tablet By Mouth With Food Twice A Day 12)  Furosemide 40 Mg Tabs (Furosemide) .... Take Two Tablets  Twice A Day 13)  Cefpodoxime Proxetil 200 Mg Tabs (Cefpodoxime Proxetil) .... Two Tablets Twice Daily For One Month 14)  Doxycycline Hyclate 100 Mg Tabs (Doxycycline Hyclate) .... Take 1 Tablet By Mouth Two Times A Day 15)  Yasmin 28 3-0.03 Mg Tabs (Drospirenone-Ethinyl Estradiol) .... Take 1 Tablet By Mouth Once A Day 16)  Vancomycin Hcl 1000 Mg Solr (Vancomycin Hcl) 17)  Meropenem 1 Gm Solr (Meropenem)  Current Medications (verified): 1)  Humalog Mix 75/25 75-25 % Susp (Insulin Lispro Prot & Lispro) .... Give 35 Units Subq Every Am and 20 Units Sub Q Every Pm Per Pcp 2)  Percocet 5-325 Mg Tabs (Oxycodone-Acetaminophen) .... Take 1 Take Tablet By Mouth Every 6 Hours 3)  Synthroid 50 Mcg Tabs (Levothyroxine Sodium) .... Take 1 Tab By Mouth Each Morning On An Empty Stomach 30 Minutes Before Meal 4)  Cymbalta 60 Mg Cpep (Duloxetine Hcl) .... Take 1 Capsule By Mouth Once A Day 5)  Pravastatin Sodium 40 Mg  Tabs (Pravastatin Sodium) .... Take 1 Tablet By Mouth Once A Day 6)  Hydrocodone-Acetaminophen 5-500 Mg Tabs (Hydrocodone-Acetaminophen) .... Take 1-2 Tablets By Mouth Every 6 Hours For Pain 7)  Aspirin 81 Mg Tabs (Aspirin) .... Take 1 Tablet By Mouth Once A Day 8)  Magnesium Oxide 400 Mg Tabs (Magnesium Oxide) .... Take 1 Tablet By Mouth Three Times A Day 9)  Ferrous Sulfate 325 (65 Fe) Mg Tabs (Ferrous Sulfate) .... Take 1 Tablet By Mouth Twice A Day 10)  Cyclobenzaprine Hcl 10 Mg Tabs (Cyclobenzaprine Hcl) .... Take 1 Tablet By Mouth Three Times A Day As Needed For Spasms 11)  Coreg 3.125 Mg Tabs (Carvedilol) .... Take 1 Tablet By Mouth With Food Twice A Day 12)  Furosemide 40 Mg Tabs (Furosemide) .... Take Two Tablets  Twice A Day 13)  Cefpodoxime Proxetil 200 Mg Tabs (Cefpodoxime Proxetil) .... Two Tablets Twice Daily For One Month 14)  Doxycycline Hyclate 100 Mg Tabs (Doxycycline Hyclate) .... Take 1 Tablet By Mouth Two Times A Day  Allergies: 1)  ! Augmentin (Amoxicillin-Pot Clavulanate) 2)  ! 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 pt has tested negative mx times no sex x 2 yrs  Risk Factors: Alcohol Use: 0 (01/23/2011) Caffeine Use: tea and sodas (01/23/2011) Exercise: no (01/23/2011)  Risk Factors: Smoking Status: never (01/23/2011)  Family History: Reviewed history from 07/27/2010 and no changes required. Mom with HTN, CAD, CABG   Social History: Reviewed history from 10/13/2010 and no changes required. former smoker,  her husband is HIV positive apparently well controlled per pt. pt has tested negative mx times no sex x 2 yrs  Review of Systems       see HPI otherwise 12 point review otherwise negative  Physical Exam  General:  alert, well-developed, and overweight-appearing.  but has lost substantial amoutn of weight Head:  normocephalic and atraumatic.   Eyes:  vision grossly intact, pupils equal, and pupils round.   Ears:  no external deformities and ear piercing(s) noted.   Nose:  no external deformity and no nasal discharge.   Mouth:  no dental plaque, pharynx pink and moist, and no erythema.   Lungs:  normal respiratory effort.  distant breath sounds Heart:  rrr distant heart sounds Abdomen:  morbidly obese, she now has pannus hanging down in more confluent manner Msk:  no joint abnormalities Extremities:  1+ left pedal edema.   Neurologic:  alert & oriented X3, strength normal in all extremities, and gait normal.  dysphoric Skin:  paitent has multiple nodular tender scarring in axilla, and she has purulent material expressed from under her breasts as well under her abdominal pannus and her labia and inguinal area Psych:  Oriented X3 dysphoric affect.  tearful   Impression & Recommendations:  Problem # 1:  HIDRADENITIS SUPPURATIVA (ICD-705.83) I will give her cipro and bactrim for now but we may give her IV antibiotics yet again. We are trying to [facility] other modalities of treatment. I would like her seen by a Plastic Surgeon for sweat gland removal and help from tertiary care center Dermatology group. Orders: T-Comprehensive Metabolic Panel ([id number]) T-CBC w/Diff [id number]) T-Culture, Wound ([id number]) Est. Patient Level IV [id number])  Problem # 2:  CHF (ICD-428.0)  She lost tremendous amout of weight with lasix.Will rehcheck her bmp today. Her updated medication list for this problem includes:    Aspirin 81 Mg Tabs (Aspirin) .Marland Kitchen... Take 1 tablet by mouth once a day     Coreg 3.125 Mg Tabs (Carvedilol) .Marland Kitchen... Take 1 tablet by mouth with food twice a day    Furosemide 40 Mg Tabs (Furosemide) .Marland Kitchen... Take two tablets  twice a day  Orders: Est. 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 She is on cymbalata Maybe wil benefit from Cog behavioral therapy as well  Orders: Est. Patient Level IV [id number])  Problem # 4:  MORBID OBESITY (ICD-278.01)  has lost quite a great deal of weight with diuretics and dieting  Orders: Est. Patient Level IV [id number])  Medications Added to Medication List This Visit: 1)  Ciprofloxacin Hcl 750 Mg Tabs (Ciprofloxacin hcl) .... Take 1 tablet by mouth two times a day for one month 2)  Bactrim Ds 800-160 Mg Tabs (Sulfamethoxazole-trimethoprim) .... One tablet twice daily  Patient Instructions: 1)  we will change to oral cipro and oral bactrim 2)  stop the doxycyline and oral cephalosporin 3)  call us back in a week to see how your are doing Prescriptions: BACTRIM DS 800-160 MG TABS (SULFAMETHOXAZOLE-TRIMETHOPRIM) one tablet twice daily  #60 x 1   Entered and Authorized by:   Alcide Evener MD   Signed by:   Rhina Brackett Dam MD on 01/23/2011   Method used:   Electronically to        [facility] [address] [phone]* (retail)       [address]       [place], [place]  [address]       Ph: [phone] or [phone]       Fax: [phone]   RxID:   [id number] CIPROFLOXACIN HCL 750 MG TABS (CIPROFLOXACIN HCL) Take 1 tablet by mouth two times a day for one month  #60 x 1   Entered and Authorized by:   Alcide Evener MD   Signed by:   Rhina Brackett Dam MD on 01/23/2011   Method used:   [facility] to        [facility] [address] [phone]* (retail)       [address]       [place], [place]  [address]       Ph: [phone] or [phone]       Fax: [phone]   RxID:    [id number]    Orders Added: 1)  T-Comprehensive Metabolic Panel [id number] 2)  T-CBC w/Diff [id number] 3)  T-Culture, Wound [[id number]] 4)  Est. Patient Level IV [id number]

## 2011-02-02 NOTE — Miscellaneous (Signed)
Summary: [facility] Cert[facility] Plan Of Care  [facility]   Imported By: Bonner Puna 01/25/2011 16:05:38  _____________________________________________________________________  External Attachment:    Type:   Image     Comment:   External Document

## 2011-02-02 NOTE — Miscellaneous (Signed)
Summary: [facility]: [place] By: Bonner Puna 01/23/2011 10:43:20  _____________________________________________________________________  External Attachment:    Type:   Image     Comment:   External Document

## 2011-02-03 ENCOUNTER — Encounter: Payer: Self-pay | Admitting: Infectious Disease

## 2011-02-03 ENCOUNTER — Encounter: Payer: Self-pay | Admitting: *Deleted

## 2011-02-06 ENCOUNTER — Encounter: Payer: Self-pay | Admitting: *Deleted

## 2011-02-06 LAB — CBC
HCT: 38.9 % (ref 36.0–46.0)
Hemoglobin: 11.9 g/dL — ABNORMAL LOW (ref 12.0–15.0)
MCH: 24.4 pg — ABNORMAL LOW (ref 26.0–34.0)
MCHC: 30.6 g/dL (ref 30.0–36.0)
MCV: 79.7 fL (ref 78.0–100.0)
Platelets: 326 K/uL (ref 150–400)
RBC: 4.88 MIL/uL (ref 3.87–5.11)
RDW: 19.7 % — ABNORMAL HIGH (ref 11.5–15.5)
WBC: 16.5 10*3/uL — ABNORMAL HIGH (ref 4.0–10.5)

## 2011-02-06 LAB — BASIC METABOLIC PANEL
BUN: 100 mg/dL — ABNORMAL HIGH (ref 6–23)
CO2: 18 mEq/L — ABNORMAL LOW (ref 19–32)
Calcium: 8.5 mg/dL (ref 8.4–10.5)
Calcium: 9.2 mg/dL (ref 8.4–10.5)
Chloride: 112 mEq/L (ref 96–112)
Creatinine, Ser: 3.4 mg/dL — ABNORMAL HIGH (ref 0.4–1.2)
GFR calc Af Amer: 17 mL/min — ABNORMAL LOW (ref >60)
GFR calc Af Amer: 17 mL/min — ABNORMAL LOW (ref >60)
GFR calc non Af Amer: 14 mL/min — ABNORMAL LOW (ref >60)
GFR calc non Af Amer: 15 mL/min — ABNORMAL LOW (ref >60)
Glucose, Bld: 152 mg/dL — ABNORMAL HIGH (ref 70–99)
Glucose, Bld: 89 mg/dL (ref 70–99)
Potassium: 5.7 mEq/L — ABNORMAL HIGH (ref 3.5–5.1)
Potassium: 7.2 mEq/L (ref 3.5–5.1)
Sodium: 133 mEq/L — ABNORMAL LOW (ref 135–145)
Sodium: 133 mEq/L — ABNORMAL LOW (ref 135–145)
Sodium: 136 mEq/L (ref 135–145)
Sodium: 136 mEq/L (ref 135–145)

## 2011-02-06 LAB — RENAL FUNCTION PANEL
BUN: 69 mg/dL — ABNORMAL HIGH (ref 6–23)
Calcium: 8.7 mg/dL (ref 8.4–10.5)
Calcium: 8.7 mg/dL (ref 8.4–10.5)
Creatinine, Ser: 2.12 mg/dL — ABNORMAL HIGH (ref 0.4–1.2)
GFR calc Af Amer: 38 mL/min — ABNORMAL LOW (ref >60)
Glucose, Bld: 111 mg/dL — ABNORMAL HIGH (ref 70–99)
Glucose, Bld: 97 mg/dL (ref 70–99)
Phosphorus: 3.2 mg/dL (ref 2.3–4.6)
Phosphorus: 3.7 mg/dL (ref 2.3–4.6)
Sodium: 134 mEq/L — ABNORMAL LOW (ref 135–145)

## 2011-02-06 LAB — DIFFERENTIAL
Basophils Absolute: 0.1 K/uL (ref 0.0–0.1)
Basophils Relative: 0 % (ref 0–1)
Eosinophils Absolute: 0.4 K/uL (ref 0.0–0.7)
Eosinophils Relative: 2 % (ref 0–5)
Lymphocytes Relative: 8 % — ABNORMAL LOW (ref 12–46)
Lymphs Abs: 1.3 10*3/uL (ref 0.7–4.0)
Monocytes Absolute: 1.2 10*3/uL — ABNORMAL HIGH (ref 0.1–1.0)
Monocytes Relative: 7 % (ref 3–12)
Neutro Abs: 13.5 10*3/uL — ABNORMAL HIGH (ref 1.7–7.7)
Neutrophils Relative %: 82 % — ABNORMAL HIGH (ref 43–77)

## 2011-02-06 LAB — BRAIN NATRIURETIC PEPTIDE: Pro B Natriuretic peptide (BNP): 244 pg/mL — ABNORMAL HIGH (ref 0.0–100.0)

## 2011-02-06 LAB — GLUCOSE, CAPILLARY
Glucose-Capillary: 103 mg/dL — ABNORMAL HIGH (ref 70–99)
Glucose-Capillary: 115 mg/dL — ABNORMAL HIGH (ref 70–99)
Glucose-Capillary: 122 mg/dL — ABNORMAL HIGH (ref 70–99)
Glucose-Capillary: 127 mg/dL — ABNORMAL HIGH (ref 70–99)
Glucose-Capillary: 159 mg/dL — ABNORMAL HIGH (ref 70–99)
Glucose-Capillary: 69 mg/dL — ABNORMAL LOW (ref 70–99)
Glucose-Capillary: 89 mg/dL (ref 70–99)
Glucose-Capillary: 95 mg/dL (ref 70–99)

## 2011-02-06 LAB — VANCOMYCIN, TROUGH: Vancomycin Tr: 14.1 ug/mL (ref 10.0–20.0)

## 2011-02-07 NOTE — Progress Notes (Addendum)
Summary: hidradenitis pain  Phone Note Call from Patient   Caller: Patient Summary of Call: Patient still in pain from hidradenitis, culture came back with possible resistance to cipro which is what the patient is taking. [facility] on [address] Initial call taken by: Jarrett Ables CMA,  January 31, 2011 10:42 AM  Follow-up for Phone Call        [name]'s most recent wound culture has again grown a multidrug resistant strain of E. coli resistant to all oral agents tested.  I will see of my partner, Dr. Johnnye Sima, can see her on a work in basis soon to discuss treatment options. Follow-up by: Michel Bickers MD,  January 31, 2011 12:42 PM  Additional Follow-up for Phone Call Additional follow up Details #1::        i will see her tomorrow a t 9AM thanks     Appended Document: hidradenitis pain Phone call to pt. Message left for pt. to return call.

## 2011-02-08 ENCOUNTER — Encounter: Payer: Self-pay | Admitting: Infectious Disease

## 2011-02-08 ENCOUNTER — Inpatient Hospital Stay (HOSPITAL_COMMUNITY)
Admission: EM | Admit: 2011-02-08 | Discharge: 2011-02-21 | DRG: 564 | Disposition: A | Discharge status: Other Acute Inpatient Hospital | Payer: BC Managed Care – PPO | Attending: Internal Medicine | Admitting: Internal Medicine

## 2011-02-08 ENCOUNTER — Ambulatory Visit (INDEPENDENT_AMBULATORY_CARE_PROVIDER_SITE_OTHER): Payer: Self-pay | Admitting: Infectious Disease

## 2011-02-08 DIAGNOSIS — E119 Type 2 diabetes mellitus without complications: Secondary | ICD-10-CM | POA: Diagnosis present

## 2011-02-08 DIAGNOSIS — T370X5A Adverse effect of sulfonamides, initial encounter: Secondary | ICD-10-CM | POA: Diagnosis present

## 2011-02-08 DIAGNOSIS — R112 Nausea with vomiting, unspecified: Secondary | ICD-10-CM | POA: Diagnosis not present

## 2011-02-08 DIAGNOSIS — E875 Hyperkalemia: Secondary | ICD-10-CM | POA: Diagnosis present

## 2011-02-08 DIAGNOSIS — F0630 Mood disorder due to known physiological condition, unspecified: Secondary | ICD-10-CM

## 2011-02-08 DIAGNOSIS — E039 Hypothyroidism, unspecified: Secondary | ICD-10-CM | POA: Diagnosis present

## 2011-02-08 DIAGNOSIS — L02219 Cutaneous abscess of trunk, unspecified: Secondary | ICD-10-CM | POA: Diagnosis present

## 2011-02-08 DIAGNOSIS — D631 Anemia in chronic kidney disease: Secondary | ICD-10-CM | POA: Diagnosis present

## 2011-02-08 DIAGNOSIS — I509 Heart failure, unspecified: Secondary | ICD-10-CM | POA: Diagnosis present

## 2011-02-08 DIAGNOSIS — A498 Other bacterial infections of unspecified site: Secondary | ICD-10-CM | POA: Diagnosis present

## 2011-02-08 DIAGNOSIS — I5022 Chronic systolic (congestive) heart failure: Secondary | ICD-10-CM | POA: Diagnosis present

## 2011-02-08 DIAGNOSIS — L03319 Cellulitis of trunk, unspecified: Secondary | ICD-10-CM | POA: Diagnosis present

## 2011-02-08 DIAGNOSIS — N183 Chronic kidney disease, stage 3 unspecified: Secondary | ICD-10-CM | POA: Diagnosis present

## 2011-02-08 DIAGNOSIS — E785 Hyperlipidemia, unspecified: Secondary | ICD-10-CM | POA: Diagnosis present

## 2011-02-08 DIAGNOSIS — E860 Dehydration: Secondary | ICD-10-CM | POA: Diagnosis present

## 2011-02-08 DIAGNOSIS — I129 Hypertensive chronic kidney disease with stage 1 through stage 4 chronic kidney disease, or unspecified chronic kidney disease: Secondary | ICD-10-CM | POA: Diagnosis present

## 2011-02-08 DIAGNOSIS — L732 Hidradenitis suppurativa: Principal | ICD-10-CM | POA: Diagnosis present

## 2011-02-08 DIAGNOSIS — D72829 Elevated white blood cell count, unspecified: Secondary | ICD-10-CM | POA: Diagnosis present

## 2011-02-08 DIAGNOSIS — N179 Acute kidney failure, unspecified: Secondary | ICD-10-CM | POA: Diagnosis present

## 2011-02-08 DIAGNOSIS — I2589 Other forms of chronic ischemic heart disease: Secondary | ICD-10-CM | POA: Diagnosis present

## 2011-02-08 LAB — BASIC METABOLIC PANEL
CO2: 17 mEq/L — ABNORMAL LOW (ref 19–32)
Calcium: 8.4 mg/dL (ref 8.4–10.5)
Calcium: 8.8 mg/dL (ref 8.4–10.5)
Creatinine, Ser: 1.6 mg/dL — ABNORMAL HIGH (ref 0.4–1.2)
Creatinine, Ser: 2.67 mg/dL — ABNORMAL HIGH (ref 0.4–1.2)
GFR calc Af Amer: 42 mL/min — ABNORMAL LOW (ref >60)
GFR calc Af Amer: 23 mL/min — ABNORMAL LOW (ref >60)
GFR calc non Af Amer: 34 mL/min — ABNORMAL LOW (ref >60)
Glucose, Bld: 197 mg/dL — ABNORMAL HIGH (ref 70–99)

## 2011-02-08 LAB — GLUCOSE, CAPILLARY
Glucose-Capillary: 101 mg/dL — ABNORMAL HIGH (ref 70–99)
Glucose-Capillary: 103 mg/dL — ABNORMAL HIGH (ref 70–99)
Glucose-Capillary: 105 mg/dL — ABNORMAL HIGH (ref 70–99)
Glucose-Capillary: 111 mg/dL — ABNORMAL HIGH (ref 70–99)
Glucose-Capillary: 113 mg/dL — ABNORMAL HIGH (ref 70–99)
Glucose-Capillary: 128 mg/dL — ABNORMAL HIGH (ref 70–99)
Glucose-Capillary: 129 mg/dL — ABNORMAL HIGH (ref 70–99)
Glucose-Capillary: 131 mg/dL — ABNORMAL HIGH (ref 70–99)
Glucose-Capillary: 133 mg/dL — ABNORMAL HIGH (ref 70–99)
Glucose-Capillary: 138 mg/dL — ABNORMAL HIGH (ref 70–99)
Glucose-Capillary: 146 mg/dL — ABNORMAL HIGH (ref 70–99)
Glucose-Capillary: 149 mg/dL — ABNORMAL HIGH (ref 70–99)
Glucose-Capillary: 154 mg/dL — ABNORMAL HIGH (ref 70–99)
Glucose-Capillary: 169 mg/dL — ABNORMAL HIGH (ref 70–99)
Glucose-Capillary: 88 mg/dL (ref 70–99)
Glucose-Capillary: 92 mg/dL (ref 70–99)
Glucose-Capillary: 95 mg/dL (ref 70–99)

## 2011-02-08 LAB — MAGNESIUM: Magnesium: 2.3 mg/dL (ref 1.5–2.5)

## 2011-02-08 LAB — CBC
HCT: 24.9 % — ABNORMAL LOW (ref 36.0–46.0)
HCT: 24.9 % — ABNORMAL LOW (ref 36.0–46.0)
HCT: 25.1 % — ABNORMAL LOW (ref 36.0–46.0)
Hemoglobin: 7.4 g/dL — ABNORMAL LOW (ref 12.0–15.0)
Hemoglobin: 7.5 g/dL — ABNORMAL LOW (ref 12.0–15.0)
Hemoglobin: 7.5 g/dL — ABNORMAL LOW (ref 12.0–15.0)
Hemoglobin: 7.6 g/dL — ABNORMAL LOW (ref 12.0–15.0)
Hemoglobin: 8.2 g/dL — ABNORMAL LOW (ref 12.0–15.0)
Hemoglobin: 9.4 g/dL — ABNORMAL LOW (ref 12.0–15.0)
MCH: 21.8 pg — ABNORMAL LOW (ref 26.0–34.0)
MCH: 21.8 pg — ABNORMAL LOW (ref 26.0–34.0)
MCH: 22 pg — ABNORMAL LOW (ref 26.0–34.0)
MCH: 22.2 pg — ABNORMAL LOW (ref 26.0–34.0)
MCH: 22.2 pg — ABNORMAL LOW (ref 26.0–34.0)
MCH: 22.3 pg — ABNORMAL LOW (ref 26.0–34.0)
MCH: 22.5 pg — ABNORMAL LOW (ref 26.0–34.0)
MCH: 25.5 pg — ABNORMAL LOW (ref 26.0–34.0)
MCHC: 30.2 g/dL (ref 30.0–36.0)
MCHC: 30.3 g/dL (ref 30.0–36.0)
MCHC: 30.7 g/dL (ref 30.0–36.0)
MCHC: 30.8 g/dL (ref 30.0–36.0)
MCV: 72.2 fL — ABNORMAL LOW (ref 78.0–100.0)
MCV: 72.7 fL — ABNORMAL LOW (ref 78.0–100.0)
MCV: 72.8 fL — ABNORMAL LOW (ref 78.0–100.0)
MCV: 73.2 fL — ABNORMAL LOW (ref 78.0–100.0)
Platelets: 297 10*3/uL (ref 150–400)
Platelets: 333 10*3/uL (ref 150–400)
Platelets: 361 10*3/uL (ref 150–400)
Platelets: 454 10*3/uL — ABNORMAL HIGH (ref 150–400)
RBC: 3.35 MIL/uL — ABNORMAL LOW (ref 3.87–5.11)
RBC: 3.4 MIL/uL — ABNORMAL LOW (ref 3.87–5.11)
RBC: 3.41 MIL/uL — ABNORMAL LOW (ref 3.87–5.11)
RBC: 3.42 MIL/uL — ABNORMAL LOW (ref 3.87–5.11)
RBC: 3.43 MIL/uL — ABNORMAL LOW (ref 3.87–5.11)
RBC: 3.45 MIL/uL — ABNORMAL LOW (ref 3.87–5.11)
RDW: 22.2 % — ABNORMAL HIGH (ref 11.5–15.5)
WBC: 11.1 10*3/uL — ABNORMAL HIGH (ref 4.0–10.5)
WBC: 12.5 10*3/uL — ABNORMAL HIGH (ref 4.0–10.5)
WBC: 12.6 10*3/uL — ABNORMAL HIGH (ref 4.0–10.5)
WBC: 12.7 10*3/uL — ABNORMAL HIGH (ref 4.0–10.5)
WBC: 12.9 10*3/uL — ABNORMAL HIGH (ref 4.0–10.5)
WBC: 13 10*3/uL — ABNORMAL HIGH (ref 4.0–10.5)
WBC: 13.2 10*3/uL — ABNORMAL HIGH (ref 4.0–10.5)

## 2011-02-08 LAB — COMPREHENSIVE METABOLIC PANEL
ALT: 8 U/L (ref 0–35)
ALT: <8 U/L (ref 0–35)
AST: 13 U/L (ref 0–37)
AST: 13 U/L (ref 0–37)
AST: 14 U/L (ref 0–37)
AST: 15 U/L (ref 0–37)
Albumin: 1.9 g/dL — ABNORMAL LOW (ref 3.5–5.2)
Albumin: 2 g/dL — ABNORMAL LOW (ref 3.5–5.2)
Alkaline Phosphatase: 77 U/L (ref 39–117)
Alkaline Phosphatase: 78 U/L (ref 39–117)
Alkaline Phosphatase: 82 U/L (ref 39–117)
Alkaline Phosphatase: 83 U/L (ref 39–117)
BUN: 21 mg/dL (ref 6–23)
BUN: 22 mg/dL (ref 6–23)
BUN: 22 mg/dL (ref 6–23)
BUN: 24 mg/dL — ABNORMAL HIGH (ref 6–23)
CO2: 23 mEq/L (ref 19–32)
CO2: 25 mEq/L (ref 19–32)
CO2: 26 mEq/L (ref 19–32)
CO2: 26 mEq/L (ref 19–32)
Calcium: 8 mg/dL — ABNORMAL LOW (ref 8.4–10.5)
Chloride: 105 mEq/L (ref 96–112)
Chloride: 108 mEq/L (ref 96–112)
Chloride: 109 mEq/L (ref 96–112)
Chloride: 110 mEq/L (ref 96–112)
Chloride: 111 mEq/L (ref 96–112)
Creatinine, Ser: 1.57 mg/dL — ABNORMAL HIGH (ref 0.4–1.2)
Creatinine, Ser: 1.67 mg/dL — ABNORMAL HIGH (ref 0.4–1.2)
Creatinine, Ser: 1.7 mg/dL — ABNORMAL HIGH (ref 0.4–1.2)
Creatinine, Ser: 1.71 mg/dL — ABNORMAL HIGH (ref 0.4–1.2)
Creatinine, Ser: 1.72 mg/dL — ABNORMAL HIGH (ref 0.4–1.2)
GFR calc Af Amer: 37 mL/min — ABNORMAL LOW (ref >60)
GFR calc Af Amer: 38 mL/min — ABNORMAL LOW (ref >60)
GFR calc Af Amer: 43 mL/min — ABNORMAL LOW (ref >60)
GFR calc non Af Amer: 31 mL/min — ABNORMAL LOW (ref >60)
GFR calc non Af Amer: 32 mL/min — ABNORMAL LOW (ref >60)
GFR calc non Af Amer: 32 mL/min — ABNORMAL LOW (ref >60)
GFR calc non Af Amer: 33 mL/min — ABNORMAL LOW (ref >60)
Glucose, Bld: 100 mg/dL — ABNORMAL HIGH (ref 70–99)
Glucose, Bld: 95 mg/dL (ref 70–99)
Potassium: 4.1 mEq/L (ref 3.5–5.1)
Potassium: 4.3 mEq/L (ref 3.5–5.1)
Potassium: 4.3 mEq/L (ref 3.5–5.1)
Potassium: 4.5 mEq/L (ref 3.5–5.1)
Sodium: 138 mEq/L (ref 135–145)
Sodium: 141 mEq/L (ref 135–145)
Total Bilirubin: 0.5 mg/dL (ref 0.3–1.2)
Total Bilirubin: 0.6 mg/dL (ref 0.3–1.2)
Total Bilirubin: 0.6 mg/dL (ref 0.3–1.2)
Total Bilirubin: 0.7 mg/dL (ref 0.3–1.2)
Total Bilirubin: 0.7 mg/dL (ref 0.3–1.2)
Total Protein: 8 g/dL (ref 6.0–8.3)

## 2011-02-08 LAB — VANCOMYCIN, RANDOM: Vancomycin Rm: 18.1 ug/mL

## 2011-02-08 LAB — PREALBUMIN: Prealbumin: 6.5 mg/dL — ABNORMAL LOW (ref 18.0–45.0)

## 2011-02-08 LAB — CULTURE, ROUTINE-ABSCESS

## 2011-02-08 LAB — DIFFERENTIAL
Basophils Relative: 0 % (ref 0–1)
Monocytes Absolute: 1.5 10*3/uL — ABNORMAL HIGH (ref 0.1–1.0)
Monocytes Relative: 7 % (ref 3–12)
Neutro Abs: 19.7 10*3/uL — ABNORMAL HIGH (ref 1.7–7.7)

## 2011-02-08 LAB — VANCOMYCIN, TROUGH: Vancomycin Tr: 58.4 ug/mL (ref 10.0–20.0)

## 2011-02-08 LAB — TYPE AND SCREEN

## 2011-02-08 LAB — RETICULOCYTES: RBC.: 3.56 MIL/uL — ABNORMAL LOW (ref 3.87–5.11)

## 2011-02-09 ENCOUNTER — Telehealth: Payer: Self-pay | Admitting: *Deleted

## 2011-02-09 DIAGNOSIS — L732 Hidradenitis suppurativa: Secondary | ICD-10-CM

## 2011-02-09 LAB — CARDIAC PANEL(CRET KIN+CKTOT+MB+TROPI)
Relative Index: INVALID (ref 0.0–2.5)
Relative Index: INVALID (ref 0.0–2.5)
Total CK: 37 U/L (ref 7–177)
Total CK: 46 U/L (ref 7–177)
Troponin I: 0.06 ng/mL (ref 0.00–0.06)

## 2011-02-09 LAB — CBC
HCT: 29.2 % — ABNORMAL LOW (ref 36.0–46.0)
Hemoglobin: 8.1 g/dL — ABNORMAL LOW (ref 12.0–15.0)
Hemoglobin: 8.3 g/dL — ABNORMAL LOW (ref 12.0–15.0)
MCH: 22.1 pg — ABNORMAL LOW (ref 26.0–34.0)
MCH: 22.1 pg — ABNORMAL LOW (ref 26.0–34.0)
MCHC: 29.8 g/dL — ABNORMAL LOW (ref 30.0–36.0)
MCHC: 30.6 g/dL (ref 30.0–36.0)
MCV: 72.1 fL — ABNORMAL LOW (ref 78.0–100.0)
Platelets: 436 10*3/uL — ABNORMAL HIGH (ref 150–400)
Platelets: 464 10*3/uL — ABNORMAL HIGH (ref 150–400)
RBC: 3.79 MIL/uL — ABNORMAL LOW (ref 3.87–5.11)
RDW: 17.6 % — ABNORMAL HIGH (ref 11.5–15.5)
RDW: 21.7 % — ABNORMAL HIGH (ref 11.5–15.5)
RDW: 22.6 % — ABNORMAL HIGH (ref 11.5–15.5)
WBC: 14.6 10*3/uL — ABNORMAL HIGH (ref 4.0–10.5)

## 2011-02-09 LAB — COMPREHENSIVE METABOLIC PANEL
ALT: <8 U/L (ref 0–35)
AST: 11 U/L (ref 0–37)
Albumin: 1.9 g/dL — ABNORMAL LOW (ref 3.5–5.2)
CO2: 22 mEq/L (ref 19–32)
Calcium: 8 mg/dL — ABNORMAL LOW (ref 8.4–10.5)
Chloride: 111 mEq/L (ref 96–112)
GFR calc Af Amer: 47 mL/min — ABNORMAL LOW (ref >60)
GFR calc non Af Amer: 39 mL/min — ABNORMAL LOW (ref >60)
Sodium: 134 mEq/L — ABNORMAL LOW (ref 135–145)
Total Bilirubin: 0.5 mg/dL (ref 0.3–1.2)

## 2011-02-09 LAB — GLUCOSE, CAPILLARY
Glucose-Capillary: 117 mg/dL — ABNORMAL HIGH (ref 70–99)
Glucose-Capillary: 121 mg/dL — ABNORMAL HIGH (ref 70–99)
Glucose-Capillary: 126 mg/dL — ABNORMAL HIGH (ref 70–99)
Glucose-Capillary: 135 mg/dL — ABNORMAL HIGH (ref 70–99)
Glucose-Capillary: 141 mg/dL — ABNORMAL HIGH (ref 70–99)
Glucose-Capillary: 141 mg/dL — ABNORMAL HIGH (ref 70–99)

## 2011-02-09 LAB — BASIC METABOLIC PANEL
BUN: 16 mg/dL (ref 6–23)
BUN: 19 mg/dL (ref 6–23)
CO2: 21 mEq/L (ref 19–32)
CO2: 22 mEq/L (ref 19–32)
Calcium: 8 mg/dL — ABNORMAL LOW (ref 8.4–10.5)
Calcium: 8.1 mg/dL — ABNORMAL LOW (ref 8.4–10.5)
Calcium: 8.8 mg/dL (ref 8.4–10.5)
Chloride: 112 mEq/L (ref 96–112)
Creatinine, Ser: 1.45 mg/dL — ABNORMAL HIGH (ref 0.4–1.2)
Creatinine, Ser: 1.7 mg/dL — ABNORMAL HIGH (ref 0.4–1.2)
GFR calc non Af Amer: 21 mL/min — ABNORMAL LOW (ref >60)
GFR calc non Af Amer: 39 mL/min — ABNORMAL LOW (ref >60)
Glucose, Bld: 100 mg/dL — ABNORMAL HIGH (ref 70–99)
Glucose, Bld: 138 mg/dL — ABNORMAL HIGH (ref 70–99)
Sodium: 136 mEq/L (ref 135–145)

## 2011-02-09 LAB — DIFFERENTIAL
Basophils Absolute: 0 10*3/uL (ref 0.0–0.1)
Basophils Absolute: 0 10*3/uL (ref 0.0–0.1)
Basophils Relative: 0 % (ref 0–1)
Basophils Relative: 0 % (ref 0–1)
Eosinophils Absolute: 0.6 10*3/uL (ref 0.0–0.7)
Eosinophils Relative: 2 % (ref 0–5)
Eosinophils Relative: 4 % (ref 0–5)
Lymphocytes Relative: 7 % — ABNORMAL LOW (ref 12–46)
Monocytes Absolute: 0.7 10*3/uL (ref 0.1–1.0)
Monocytes Absolute: 0.9 10*3/uL (ref 0.1–1.0)
Monocytes Absolute: 1.1 10*3/uL — ABNORMAL HIGH (ref 0.1–1.0)
Monocytes Relative: 6 % (ref 3–12)
Neutrophils Relative %: 85 % — ABNORMAL HIGH (ref 43–77)
Neutrophils Relative %: 86 % — ABNORMAL HIGH (ref 43–77)

## 2011-02-09 LAB — RETICULOCYTES: Retic Count, Absolute: 96.5 10*3/uL (ref 19.0–186.0)

## 2011-02-09 LAB — URINALYSIS, ROUTINE W REFLEX MICROSCOPIC
Bilirubin Urine: NEGATIVE
Glucose, UA: NEGATIVE mg/dL
Specific Gravity, Urine: 1.02 (ref 1.005–1.030)
pH: 5.5 (ref 5.0–8.0)

## 2011-02-09 LAB — BRAIN NATRIURETIC PEPTIDE: Pro B Natriuretic peptide (BNP): 1010 pg/mL — ABNORMAL HIGH (ref 0.0–100.0)

## 2011-02-09 LAB — URINE MICROSCOPIC-ADD ON

## 2011-02-09 LAB — HEMOGLOBIN A1C
Hgb A1c MFr Bld: 7 % — ABNORMAL HIGH (ref <5.7)
Mean Plasma Glucose: 154 mg/dL — ABNORMAL HIGH (ref <117)

## 2011-02-09 LAB — IRON AND TIBC
Saturation Ratios: 5 % — ABNORMAL LOW (ref 20–55)
UIBC: 201 ug/dL

## 2011-02-09 LAB — SEDIMENTATION RATE: Sed Rate: 77 mm/hr — ABNORMAL HIGH (ref 0–22)

## 2011-02-09 LAB — MRSA PCR SCREENING: MRSA by PCR: INVALID — AB

## 2011-02-09 LAB — TSH
TSH: 3.521 u[IU]/mL (ref 0.350–4.500)
TSH: 6.906 u[IU]/mL — ABNORMAL HIGH (ref 0.350–4.500)

## 2011-02-09 LAB — FOLATE: Folate: 3.3 ng/mL — ABNORMAL LOW

## 2011-02-09 LAB — CORTISOL: Cortisol, Plasma: 8 ug/dL

## 2011-02-09 LAB — HIV ANTIBODY (ROUTINE TESTING W REFLEX): HIV: NONREACTIVE

## 2011-02-10 ENCOUNTER — Inpatient Hospital Stay (HOSPITAL_COMMUNITY): Payer: BC Managed Care – PPO

## 2011-02-10 LAB — CBC
HCT: 28.3 % — ABNORMAL LOW (ref 36.0–46.0)
Hemoglobin: 8.4 g/dL — ABNORMAL LOW (ref 12.0–15.0)
MCH: 24.9 pg — ABNORMAL LOW (ref 26.0–34.0)
MCV: 83.7 fL (ref 78.0–100.0)
RBC: 3.38 MIL/uL — ABNORMAL LOW (ref 3.87–5.11)

## 2011-02-10 LAB — BASIC METABOLIC PANEL
BUN: 50 mg/dL — ABNORMAL HIGH (ref 6–23)
CO2: 21 mEq/L (ref 19–32)
Chloride: 107 mEq/L (ref 96–112)
Creatinine, Ser: 1.99 mg/dL — ABNORMAL HIGH (ref 0.4–1.2)
Potassium: 4.4 mEq/L (ref 3.5–5.1)

## 2011-02-10 LAB — PREALBUMIN: Prealbumin: 11.2 mg/dL — ABNORMAL LOW (ref 17.0–34.0)

## 2011-02-10 LAB — GLUCOSE, CAPILLARY
Glucose-Capillary: 109 mg/dL — ABNORMAL HIGH (ref 70–99)
Glucose-Capillary: 157 mg/dL — ABNORMAL HIGH (ref 70–99)

## 2011-02-10 LAB — TYPE AND SCREEN: ABO/RH(D): B POS

## 2011-02-10 LAB — SURGICAL PCR SCREEN: MRSA, PCR: NEGATIVE

## 2011-02-10 NOTE — Consult Note (Signed)
[name], KWOK NO.:  [id number]  MEDICAL RECORD NO.:  [id number]           PATIENT TYPE:  LOCATION:                                 FACILITY:  PHYSICIAN:  Sharion Grieves Sanger, DO      DATE OF BIRTH:  10-09-1961  DATE OF CONSULTATION:  02/09/2011 DATE OF DISCHARGE:                                CONSULTATION   CHIEF COMPLAINT:  Hidradenitis.  HISTORY OF PRESENT ILLNESS:  [name] is a 50 year old black female who has been admitted for treatment of severe hidradenitis.  The patient states that she has been dealing with this for many years.  She has had a couple of areas on the posterior legs drained but not in the groin area.  She denies having had surgery or skin grafts for this in the past.  She has been on antibiotic treatment under Dr. Lucianne Lei Dam's guidance at home.  Unfortunately, it did not solve the problem this time and she needed to be admitted for IV antibiotics.  She is also being worked up for possible Port-A-Cath for IV antibiotics at home.  She was on Bactrim at one point at home and had renal problems from it.  Her white count was elevated at admission.  She also has several other medical conditions.  She does work and is somewhat active.  She is very concerned about the financial burden as well.  PAST MEDICAL HISTORY:  Diabetes, hypertension, hypothyroidism, hidradenitis, and cardiomyopathy.  REVIEW OF SYSTEMS:  Over the ensuing days of the worsening of this episode, she was noted to be feeling more fatigued and her blood pressures and blood sugars were also harder to manage.  She denied any significant change in her vision or weight or blood in her urine or stool.  Psychological, she is concerned about the finances but otherwise stable.  PHYSICAL EXAMINATION:  GENERAL:  She is alert and oriented, cooperative in no acute distress but uncomfortable with movement.  She is obese and has an extremely large pannus. VITAL SIGNS:  Her temperature  is 98, pulse 78, respirations 20, blood pressure 145/70. HEENT:  Pupils are equal and reactive.  Extraocular muscles are intact. LUNGS:  There is no labored breathing. HEART:  Regular rate and rhythm. ABDOMEN:  Soft and large but not tender, unable to palpate structures due to the size. EXTREMITIES:  Lower and upper extremity pulses are equal bilaterally. No excessive swelling of her distal extremities.  The lower pannus groin and perineal and buttock area have multiple areas of her purulent hidradenitis that are draining yellowish with distinct malodor. They do appear swollen, indurated.  Even with a  light pressure they drained quite a bit.  There is some bloody drainage as well.  LABORATORY DATA:  On admission, her hemoglobin was 9.4.  White count 22.5, neutrophils and monocytes were elevated.  Repeat CBC shows a decrease in her white blood cells 218.9, hemoglobin down to 8.7, neutrophils and monocytes improved but still elevated.  Her hemoglobin A1c 6.6 .  TSH was 3.5.  Cortisol 8.  MRSA screening negative.  BMP showed potassium elevated at 6.4.  Glucose, BUN and creatinine and GFR are all elevated.  MEDICATIONS:  Currently her medications include aspirin, Coreg, Cymbalta, iron, folic acid, Lasix, insulin, Synthroid, magnesium oxide, nystatin, pravastatin, vancomycin, and Tylenol as needed, Zofran as needed, pain medicine as needed.  FOLLOWUP:  As above.  ASSESSMENT:  Chronic hidradenitis diabetes, high blood pressure.  PLAN:  The patient would likely benefit from OR incision and drainage of the areas with Pulsavac.  I also recommended checking a prealbumin level to be sure she is getting the nutritional requirements met to heal this, also recommend adding vitamin and zinc to help with the healing and a multivitamin but based on her renal status that should be evaluated after she is seen by Dr. Brantley Stage would recommend Silvadene to the areas for the interim.  If she is taken  to the OR the area could be packed with the Dakin's solution 0.5% for 24-48 hours and then normal saline packing after that.  The patient will need to make efforts in weight loss as well for her overall health after this as well.  She will likely need long-time long-term antibiotics.  I will be available for consultation if needed.  Thank you very much for allowing me to see this patient.     Theodoro Kos, DO     CS/MEDQ  D:  02/09/2011  T:  02/10/2011  Job:  [id number]  Electronically Signed by Theodoro Kos  on 02/10/2011 01:29:28 PM

## 2011-02-11 LAB — COMPREHENSIVE METABOLIC PANEL
ALT: 20 U/L (ref 0–35)
AST: 24 U/L (ref 0–37)
CO2: 25 mEq/L (ref 19–32)
Calcium: 8.2 mg/dL — ABNORMAL LOW (ref 8.4–10.5)
Chloride: 106 mEq/L (ref 96–112)
GFR calc non Af Amer: 28 mL/min — ABNORMAL LOW (ref >60)
Glucose, Bld: 161 mg/dL — ABNORMAL HIGH (ref 70–99)
Sodium: 135 mEq/L (ref 135–145)
Total Bilirubin: 0.5 mg/dL (ref 0.3–1.2)

## 2011-02-11 LAB — GLUCOSE, CAPILLARY
Glucose-Capillary: 151 mg/dL — ABNORMAL HIGH (ref 70–99)
Glucose-Capillary: 142 mg/dL — ABNORMAL HIGH (ref 70–99)

## 2011-02-11 LAB — CBC
HCT: 28.1 % — ABNORMAL LOW (ref 36.0–46.0)
Hemoglobin: 8.3 g/dL — ABNORMAL LOW (ref 12.0–15.0)
MCH: 25.1 pg — ABNORMAL LOW (ref 26.0–34.0)
MCHC: 29.5 g/dL — ABNORMAL LOW (ref 30.0–36.0)
RBC: 3.31 MIL/uL — ABNORMAL LOW (ref 3.87–5.11)

## 2011-02-12 LAB — URINALYSIS, ROUTINE W REFLEX MICROSCOPIC
Bilirubin Urine: NEGATIVE
Glucose, UA: NEGATIVE mg/dL
Ketones, ur: NEGATIVE mg/dL
Protein, ur: 100 mg/dL — AB
Urobilinogen, UA: 1 mg/dL (ref 0.0–1.0)

## 2011-02-12 LAB — CBC
HCT: 29.4 % — ABNORMAL LOW (ref 36.0–46.0)
HCT: 29.5 % — ABNORMAL LOW (ref 36.0–46.0)
Hemoglobin: 7.6 g/dL — ABNORMAL LOW (ref 12.0–15.0)
Hemoglobin: 9.1 g/dL — ABNORMAL LOW (ref 12.0–15.0)
Hemoglobin: 9.1 g/dL — ABNORMAL LOW (ref 12.0–15.0)
Hemoglobin: 9.1 g/dL — ABNORMAL LOW (ref 12.0–15.0)
Hemoglobin: 9.2 g/dL — ABNORMAL LOW (ref 12.0–15.0)
Hemoglobin: 9.8 g/dL — ABNORMAL LOW (ref 12.0–15.0)
MCH: 25.6 pg — ABNORMAL LOW (ref 26.0–34.0)
MCHC: 30.3 g/dL (ref 30.0–36.0)
MCHC: 30.5 g/dL (ref 30.0–36.0)
MCHC: 30.9 g/dL (ref 30.0–36.0)
MCHC: 31 g/dL (ref 30.0–36.0)
MCV: 75 fL — ABNORMAL LOW (ref 78.0–100.0)
Platelets: 319 10*3/uL (ref 150–400)
Platelets: 425 10*3/uL — ABNORMAL HIGH (ref 150–400)
Platelets: 427 10*3/uL — ABNORMAL HIGH (ref 150–400)
Platelets: 509 10*3/uL — ABNORMAL HIGH (ref 150–400)
RBC: 2.97 MIL/uL — ABNORMAL LOW (ref 3.87–5.11)
RBC: 3.91 MIL/uL (ref 3.87–5.11)
RBC: 3.94 MIL/uL (ref 3.87–5.11)
RBC: 4 MIL/uL (ref 3.87–5.11)
RDW: 21.6 % — ABNORMAL HIGH (ref 11.5–15.5)
RDW: 21.6 % — ABNORMAL HIGH (ref 11.5–15.5)
RDW: 21.7 % — ABNORMAL HIGH (ref 11.5–15.5)
RDW: 22.2 % — ABNORMAL HIGH (ref 11.5–15.5)
RDW: 22.3 % — ABNORMAL HIGH (ref 11.5–15.5)
WBC: 15.6 10*3/uL — ABNORMAL HIGH (ref 4.0–10.5)
WBC: 15.7 10*3/uL — ABNORMAL HIGH (ref 4.0–10.5)
WBC: 15.9 10*3/uL — ABNORMAL HIGH (ref 4.0–10.5)

## 2011-02-12 LAB — GLUCOSE, CAPILLARY
Glucose-Capillary: 113 mg/dL — ABNORMAL HIGH (ref 70–99)
Glucose-Capillary: 144 mg/dL — ABNORMAL HIGH (ref 70–99)
Glucose-Capillary: 103 mg/dL — ABNORMAL HIGH (ref 70–99)
Glucose-Capillary: 106 mg/dL — ABNORMAL HIGH (ref 70–99)
Glucose-Capillary: 113 mg/dL — ABNORMAL HIGH (ref 70–99)
Glucose-Capillary: 118 mg/dL — ABNORMAL HIGH (ref 70–99)
Glucose-Capillary: 118 mg/dL — ABNORMAL HIGH (ref 70–99)
Glucose-Capillary: 119 mg/dL — ABNORMAL HIGH (ref 70–99)
Glucose-Capillary: 136 mg/dL — ABNORMAL HIGH (ref 70–99)
Glucose-Capillary: 186 mg/dL — ABNORMAL HIGH (ref 70–99)
Glucose-Capillary: 60 mg/dL — ABNORMAL LOW (ref 70–99)
Glucose-Capillary: 61 mg/dL — ABNORMAL LOW (ref 70–99)
Glucose-Capillary: 68 mg/dL — ABNORMAL LOW (ref 70–99)
Glucose-Capillary: 71 mg/dL (ref 70–99)
Glucose-Capillary: 75 mg/dL (ref 70–99)
Glucose-Capillary: 81 mg/dL (ref 70–99)
Glucose-Capillary: 85 mg/dL (ref 70–99)
Glucose-Capillary: 87 mg/dL (ref 70–99)
Glucose-Capillary: 95 mg/dL (ref 70–99)
Glucose-Capillary: 96 mg/dL (ref 70–99)
Glucose-Capillary: 98 mg/dL (ref 70–99)
Glucose-Capillary: 99 mg/dL (ref 70–99)
Glucose-Capillary: 137 mg/dL — ABNORMAL HIGH (ref 70–99)

## 2011-02-12 LAB — BASIC METABOLIC PANEL
BUN: 26 mg/dL — ABNORMAL HIGH (ref 6–23)
CO2: 24 mEq/L (ref 19–32)
Calcium: 8.3 mg/dL — ABNORMAL LOW (ref 8.4–10.5)
Calcium: 8.3 mg/dL — ABNORMAL LOW (ref 8.4–10.5)
Calcium: 8.3 mg/dL — ABNORMAL LOW (ref 8.4–10.5)
Calcium: 8.6 mg/dL (ref 8.4–10.5)
Chloride: 108 mEq/L (ref 96–112)
Creatinine, Ser: 1.54 mg/dL — ABNORMAL HIGH (ref 0.4–1.2)
Creatinine, Ser: 1.62 mg/dL — ABNORMAL HIGH (ref 0.4–1.2)
Creatinine, Ser: 1.74 mg/dL — ABNORMAL HIGH (ref 0.4–1.2)
Creatinine, Ser: 1.78 mg/dL — ABNORMAL HIGH (ref 0.4–1.2)
Creatinine, Ser: 1.88 mg/dL — ABNORMAL HIGH (ref 0.4–1.2)
GFR calc Af Amer: 35 mL/min — ABNORMAL LOW (ref >60)
GFR calc Af Amer: 38 mL/min — ABNORMAL LOW (ref >60)
GFR calc Af Amer: 44 mL/min — ABNORMAL LOW (ref >60)
GFR calc non Af Amer: 30 mL/min — ABNORMAL LOW (ref >60)
GFR calc non Af Amer: 31 mL/min — ABNORMAL LOW (ref >60)
GFR calc non Af Amer: 34 mL/min — ABNORMAL LOW (ref >60)
GFR calc non Af Amer: 36 mL/min — ABNORMAL LOW (ref >60)
Glucose, Bld: 176 mg/dL — ABNORMAL HIGH (ref 70–99)
Glucose, Bld: 73 mg/dL (ref 70–99)
Glucose, Bld: 78 mg/dL (ref 70–99)
Glucose, Bld: 83 mg/dL (ref 70–99)
Potassium: 4.1 mEq/L (ref 3.5–5.1)
Sodium: 134 mEq/L — ABNORMAL LOW (ref 135–145)
Sodium: 137 mEq/L (ref 135–145)
Sodium: 138 mEq/L (ref 135–145)

## 2011-02-12 LAB — CULTURE, BLOOD (ROUTINE X 2)

## 2011-02-12 LAB — COMPREHENSIVE METABOLIC PANEL
Albumin: 2.3 g/dL — ABNORMAL LOW (ref 3.5–5.2)
Alkaline Phosphatase: 156 U/L — ABNORMAL HIGH (ref 39–117)
BUN: 44 mg/dL — ABNORMAL HIGH (ref 6–23)
BUN: 25 mg/dL — ABNORMAL HIGH (ref 6–23)
CO2: 26 mEq/L (ref 19–32)
Calcium: 8.3 mg/dL — ABNORMAL LOW (ref 8.4–10.5)
Chloride: 106 mEq/L (ref 96–112)
Creatinine, Ser: 1.61 mg/dL — ABNORMAL HIGH (ref 0.4–1.2)
GFR calc Af Amer: 44 mL/min — ABNORMAL LOW (ref >60)
GFR calc non Af Amer: 36 mL/min — ABNORMAL LOW (ref >60)
Glucose, Bld: 107 mg/dL — ABNORMAL HIGH (ref 70–99)
Sodium: 136 mEq/L (ref 135–145)
Total Protein: 7 g/dL (ref 6.0–8.3)
Total Protein: 7.4 g/dL (ref 6.0–8.3)

## 2011-02-12 LAB — DIFFERENTIAL
Basophils Absolute: 0 10*3/uL (ref 0.0–0.1)
Basophils Absolute: 0 10*3/uL (ref 0.0–0.1)
Basophils Absolute: 0 10*3/uL (ref 0.0–0.1)
Basophils Relative: 0 % (ref 0–1)
Basophils Relative: 0 % (ref 0–1)
Basophils Relative: 0 % (ref 0–1)
Basophils Relative: 0 % (ref 0–1)
Eosinophils Absolute: 0.6 10*3/uL (ref 0.0–0.7)
Eosinophils Absolute: 0.7 10*3/uL (ref 0.0–0.7)
Eosinophils Relative: 5 % (ref 0–5)
Eosinophils Relative: 5 % (ref 0–5)
Lymphocytes Relative: 10 % — ABNORMAL LOW (ref 12–46)
Lymphocytes Relative: 8 % — ABNORMAL LOW (ref 12–46)
Lymphocytes Relative: 9 % — ABNORMAL LOW (ref 12–46)
Lymphs Abs: 1.1 10*3/uL (ref 0.7–4.0)
Lymphs Abs: 1.4 10*3/uL (ref 0.7–4.0)
Monocytes Absolute: 0.7 10*3/uL (ref 0.1–1.0)
Monocytes Absolute: 1 10*3/uL (ref 0.1–1.0)
Monocytes Relative: 8 % (ref 3–12)
Monocytes Relative: 5 % (ref 3–12)
Monocytes Relative: 6 % (ref 3–12)
Monocytes Relative: 6 % (ref 3–12)
Neutro Abs: 12 10*3/uL — ABNORMAL HIGH (ref 1.7–7.7)
Neutro Abs: 11.9 10*3/uL — ABNORMAL HIGH (ref 1.7–7.7)
Neutro Abs: 11.9 10*3/uL — ABNORMAL HIGH (ref 1.7–7.7)
Neutrophils Relative %: 80 % — ABNORMAL HIGH (ref 43–77)
Neutrophils Relative %: 81 % — ABNORMAL HIGH (ref 43–77)
Neutrophils Relative %: 82 % — ABNORMAL HIGH (ref 43–77)
Neutrophils Relative %: 84 % — ABNORMAL HIGH (ref 43–77)

## 2011-02-12 LAB — MRSA CULTURE

## 2011-02-12 LAB — BRAIN NATRIURETIC PEPTIDE
Pro B Natriuretic peptide (BNP): 775 pg/mL — ABNORMAL HIGH (ref 0.0–100.0)
Pro B Natriuretic peptide (BNP): 881 pg/mL — ABNORMAL HIGH (ref 0.0–100.0)

## 2011-02-12 LAB — CK TOTAL AND CKMB (NOT AT ARMC)
Relative Index: 1.9 (ref 0.0–2.5)
Relative Index: INVALID (ref 0.0–2.5)
Total CK: 100 U/L (ref 7–177)
Total CK: 99 U/L (ref 7–177)

## 2011-02-12 LAB — PROTIME-INR: Prothrombin Time: 18.1 seconds — ABNORMAL HIGH (ref 11.6–15.2)

## 2011-02-12 LAB — URINE MICROSCOPIC-ADD ON

## 2011-02-12 LAB — PROTEIN ELECTROPH W RFLX QUANT IMMUNOGLOBULINS
Albumin ELP: 35 % — ABNORMAL LOW (ref 55.8–66.1)
Alpha-1-Globulin: 7.5 % — ABNORMAL HIGH (ref 2.9–4.9)
Beta 2: 6.5 % (ref 3.2–6.5)
Total Protein ELP: 9.8 g/dL — ABNORMAL HIGH (ref 6.0–8.3)

## 2011-02-12 LAB — POCT CARDIAC MARKERS
CKMB, poc: 1.4 ng/mL (ref 1.0–8.0)
Myoglobin, poc: 174 ng/mL (ref 12–200)

## 2011-02-12 LAB — CREATININE, URINE, 24 HOUR
Collection Interval-UCRE24: 24 hours
Creatinine, 24H Ur: 2125 mg/d — ABNORMAL HIGH (ref 700–1800)
Creatinine, Urine: 26.9 mg/dL

## 2011-02-12 LAB — VITAMIN B12: Vitamin B-12: 1090 pg/mL — ABNORMAL HIGH (ref 211–911)

## 2011-02-12 LAB — LIPID PANEL
Cholesterol: 92 mg/dL (ref 0–200)
Triglycerides: 47 mg/dL (ref <150)

## 2011-02-12 LAB — TSH: TSH: 3.4 u[IU]/mL (ref 0.350–4.500)

## 2011-02-12 LAB — PHOSPHORUS
Phosphorus: 3.7 mg/dL (ref 2.3–4.6)
Phosphorus: 4.2 mg/dL (ref 2.3–4.6)
Phosphorus: 4.8 mg/dL — ABNORMAL HIGH (ref 2.3–4.6)

## 2011-02-12 LAB — HEMOGLOBIN A1C: Mean Plasma Glucose: 214 mg/dL

## 2011-02-12 LAB — RETICULOCYTES
RBC.: 4.61 MIL/uL (ref 3.87–5.11)
Retic Ct Pct: 3 % (ref 0.4–3.1)

## 2011-02-12 LAB — TROPONIN I
Troponin I: 0.04 ng/mL (ref 0.00–0.06)
Troponin I: 0.04 ng/mL (ref 0.00–0.06)

## 2011-02-12 LAB — IGG, IGA, IGM: IgM, Serum: 78 mg/dL (ref 60–263)

## 2011-02-12 LAB — MAGNESIUM
Magnesium: 1.4 mg/dL — ABNORMAL LOW (ref 1.5–2.5)
Magnesium: 1.6 mg/dL (ref 1.5–2.5)
Magnesium: 1.6 mg/dL (ref 1.5–2.5)

## 2011-02-12 LAB — IMMUNOFIXATION ADD-ON

## 2011-02-12 LAB — URINE CULTURE: Colony Count: NO GROWTH

## 2011-02-12 LAB — APTT: aPTT: 36 seconds (ref 24–37)

## 2011-02-13 ENCOUNTER — Other Ambulatory Visit: Payer: Self-pay | Admitting: Surgery

## 2011-02-13 LAB — BASIC METABOLIC PANEL WITH GFR
BUN: 37 mg/dL — ABNORMAL HIGH (ref 6–23)
CO2: 26 meq/L (ref 19–32)
Calcium: 8.2 mg/dL — ABNORMAL LOW (ref 8.4–10.5)
Chloride: 105 meq/L (ref 96–112)
Creatinine, Ser: 1.46 mg/dL — ABNORMAL HIGH (ref 0.4–1.2)
GFR calc non Af Amer: 38 mL/min — ABNORMAL LOW
Glucose, Bld: 121 mg/dL — ABNORMAL HIGH (ref 70–99)
Potassium: 4.8 meq/L (ref 3.5–5.1)
Sodium: 135 meq/L (ref 135–145)

## 2011-02-13 LAB — GLUCOSE, CAPILLARY
Glucose-Capillary: 131 mg/dL — ABNORMAL HIGH (ref 70–99)
Glucose-Capillary: 122 mg/dL — ABNORMAL HIGH (ref 70–99)

## 2011-02-13 LAB — CBC
HCT: 25.3 % — ABNORMAL LOW (ref 36.0–46.0)
Hemoglobin: 7.5 g/dL — ABNORMAL LOW (ref 12.0–15.0)
MCH: 24.9 pg — ABNORMAL LOW (ref 26.0–34.0)
MCHC: 29.6 g/dL — ABNORMAL LOW (ref 30.0–36.0)
MCV: 84.1 fL (ref 78.0–100.0)
Platelets: 327 10*3/uL (ref 150–400)
RBC: 3.01 MIL/uL — ABNORMAL LOW (ref 3.87–5.11)
RDW: 16.8 % — ABNORMAL HIGH (ref 11.5–15.5)
WBC: 13 10*3/uL — ABNORMAL HIGH (ref 4.0–10.5)

## 2011-02-13 LAB — CULTURE, ROUTINE-ABSCESS

## 2011-02-13 LAB — VANCOMYCIN, TROUGH: Vancomycin Tr: 24.4 ug/mL — ABNORMAL HIGH (ref 10.0–20.0)

## 2011-02-14 DIAGNOSIS — L732 Hidradenitis suppurativa: Secondary | ICD-10-CM

## 2011-02-14 LAB — CBC
Platelets: 351 10*3/uL (ref 150–400)
RBC: 2.96 MIL/uL — ABNORMAL LOW (ref 3.87–5.11)
RDW: 17 % — ABNORMAL HIGH (ref 11.5–15.5)
WBC: 13.6 10*3/uL — ABNORMAL HIGH (ref 4.0–10.5)

## 2011-02-14 LAB — GLUCOSE, CAPILLARY
Glucose-Capillary: 157 mg/dL — ABNORMAL HIGH (ref 70–99)
Glucose-Capillary: 193 mg/dL — ABNORMAL HIGH (ref 70–99)
Glucose-Capillary: 162 mg/dL — ABNORMAL HIGH (ref 70–99)

## 2011-02-14 NOTE — Progress Notes (Signed)
  Phone Note From Other Clinic   Caller: Nurse Summary of Call: Archie Patten with [facility] left message asking if port a cath has been placed yet.Marland Kitchen another staff member saw this & said she was admitted yesterday. I called [facility] rn back & told her this Initial call taken by: Elige Radon RN,  February 09, 2011 4:54 PM

## 2011-02-14 NOTE — Assessment & Plan Note (Signed)
Summary: f/u [mkj]   Vital Signs:  Patient profile:   50 year old female Height:      67 inches (170.18 cm) Weight:      277.12 pounds (125.96 kg) BMI:     43.56 Temp:     98.3 degrees F (36.83 degrees C) oral Pulse rate:   97 / minute BP sitting:   149 / 84  (right arm)  Vitals Entered By: Myrtis Hopping CMA Deborra Medina) (February 08, 2011 11:01 AM) CC: follow-up visit, discuss PICC or portacath Is Patient Diabetic? Yes Did you bring your meter with you today? No Pain Assessment Patient in pain? yes     Location: groin Intensity: 10 Type: inflammation Onset of pain  Constant Nutritional Status BMI of > 30 = obese Nutritional Status Detail appetite "bad"  Have you ever been in a relationship where you felt threatened, hurt or afraid?Unable to ask   Does patient need assistance? Functional Status Self care Ambulation Normal   Visit Type:  Follow-up Referring Provider:  Harlan Stains Primary Provider:  Flonnie Overman  CC:  follow-up visit and discuss PICC or portacath.  History of Present Illness: 74 you AA lady with Hydradenitis suppurativa, morbid obesity, and CHF. I had admitted her to [facility] for Iv antiibiotics and diuresis a few months ago.. She improved dramatically at [facility] and was ultimately dc on vancomycin and ertapenem (latter chose after she grew E. coli that was resistant to AMPICILLIn,  AMPICILLIN/SULBACTAM:I to  CEFAZOLIN: S to cefepime, ceftazidie, ceftriaxone, R to gent, s to imipenem, R to TMP, S to cefoxitin). She had lost60+ pounds with aggressive diureisis but developed prerenal failure and hyperkalemia. She had developed new lesions on her pannus and buttocks and I cultured pannus and grew E coli AMPICILLIN  , gent, I tobra, R cipro, TMPSMX, S to carbapenems, rocephin, ceftaz, cefepime. WHen she was admitted or her renal faliure, I had picc line placed at dc and she was rx for more than 3 wks with IV vanco and imipenem. I  switched to oral doxycyline  and cefdinir and unfortunately within a week to 2 weeks her  HS was flaring again on her groin, vagina, inside of thighs back of legs. It had continued to progress since then. She again broke down in tears today in clinic. I decided tto go with oral bactrim and cipro but she failed to improve on this regimen and actually grew an E coli R to both of these antibiotics. ALl of her lesions worsened and my partners tried to arragne for followup but the pt did not come to clinic. She presents today tellign me that all of her lesions are worse--but she will not let me examine them. She has had new onset of nausea last 24 hours with nonbloody emesis and feels weak. She refused admission for workup and refused rx for zofran. We had discussed placement of portacath for IV abx infusion and we spent great deal of time arragning this today. We will also try to get her seen by Romana Juniper here in [facility] and if not with him then [facility] plaAll in all I spent over an hour with the patient including greater than 50% of time in face to face consultation and coordination of care. 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 Give 35 Units Subq Every Am and 20 Units Sub Q Every Pm Per Pcp 2)  Percocet 5-325 Mg Tabs (Oxycodone-Acetaminophen) .... Take 1 Take Tablet By Mouth Every 6 Hours 3)  Synthroid 50 Mcg Tabs (Levothyroxine Sodium) .... Take 1 Tab By Mouth Each Morning On An Empty Stomach 30 Minutes Before Meal 4)  Cymbalta 60 Mg Cpep (Duloxetine Hcl) .... Take 1 Capsule By Mouth Once A Day 5)  Pravastatin Sodium 40 Mg Tabs (Pravastatin Sodium) .... Take 1 Tablet By Mouth Once A Day 6)  Hydrocodone-Acetaminophen 5-500 Mg Tabs (Hydrocodone-Acetaminophen) .... Take 1-2 Tablets By Mouth Every 6 Hours For Pain 7)  Aspirin 81 Mg Tabs (Aspirin) .... Take 1 Tablet By Mouth Once A Day 8)  Magnesium Oxide 400 Mg Tabs (Magnesium Oxide) .... Take 1 Tablet By Mouth Three Times A Day 9)  Ferrous Sulfate 325 (65 Fe) Mg Tabs (Ferrous Sulfate) .... Take 1 Tablet By Mouth Twice A Day 10)  Cyclobenzaprine Hcl 10 Mg Tabs (Cyclobenzaprine Hcl) .... Take 1 Tablet By Mouth Three Times A Day As Needed For Spasms 11)  Coreg 3.125 Mg Tabs (Carvedilol) .... Take 1 Tablet By Mouth With Food Twice A Day 12)  Furosemide 40 Mg Tabs (Furosemide) .... Take Two Tablets  Twice A Day 13)  Ciprofloxacin Hcl 750 Mg Tabs (Ciprofloxacin Hcl) .... Take 1 Tablet By Mouth Two Times A Day For One Month 14)  Bactrim Ds 800-160 Mg Tabs (Sulfamethoxazole-Trimethoprim) .... One Tablet Twice Daily  Current Medications (verified): 1)  Humalog Mix 75/25 75-25 % Susp (Insulin Lispro Prot & Lispro) .... Give 35 Units Subq Every Am and 20 Units Sub Q Every Pm Per Pcp 2)  Percocet 5-325 Mg Tabs (Oxycodone-Acetaminophen) .... Take 1 Take Tablet By Mouth Every 6 Hours 3)  Synthroid 50 Mcg  Tabs (Levothyroxine Sodium) .... Take 1 Tab By Mouth Each Morning On An Empty Stomach 30 Minutes Before Meal 4)  Cymbalta 60 Mg Cpep (Duloxetine Hcl) .... Take 1 Capsule By Mouth Once A Day 5)  Pravastatin Sodium 40 Mg Tabs (Pravastatin Sodium) .... Take 1 Tablet By Mouth Once A Day 6)  Hydrocodone-Acetaminophen 5-500 Mg Tabs (Hydrocodone-Acetaminophen) .... Take 1-2 Tablets By Mouth Every 6 Hours For Pain 7)  Aspirin 81 Mg Tabs (Aspirin) .... Take 1 Tablet By Mouth Once A Day 8)  Magnesium Oxide 400 Mg Tabs (Magnesium Oxide) .... Take 1 Tablet By Mouth Three Times A Day 9)  Ferrous Sulfate 325 (65 Fe) Mg Tabs (Ferrous Sulfate) .... Take 1 Tablet By Mouth Twice A Day 10)  Cyclobenzaprine Hcl 10 Mg Tabs (  Cyclobenzaprine Hcl) .... Take 1 Tablet By Mouth Three Times A Day As Needed For Spasms 11)  Coreg 3.125 Mg Tabs (Carvedilol) .... Take 1 Tablet By Mouth With Food Twice A Day 12)  Furosemide 40 Mg Tabs (Furosemide) .... Take Two Tablets  Twice A Day 13)  Ciprofloxacin Hcl 750 Mg Tabs (Ciprofloxacin Hcl) .... Take 1 Tablet By Mouth Two Times A Day For One Month 14)  Bactrim Ds 800-160 Mg Tabs (Sulfamethoxazole-Trimethoprim) .... One Tablet Twice Daily  Allergies: 1)  ! Augmentin (Amoxicillin-Pot Clavulanate) 2)  ! 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 pt has tested negative mx times no sex x 2 yrs  Risk Factors: Alcohol Use: 0 (02/08/2011) Caffeine Use: tea and sodas (02/08/2011) Exercise: no (02/08/2011)  Risk Factors: Smoking Status:  never (02/08/2011)  Family History: Reviewed history from 07/27/2010 and no changes required. Mom with HTN, CAD, CABG   Social History: Reviewed history from 10/13/2010 and no changes required. former smoker, her husband is HIV positive apparently well controlled per pt. pt has tested negative mx times no sex x 2 yrs  Review of Systems       see hpi otherwise negative on 12 pt review  Physical Exam  General:  alert, well-developed, and overweight-appearing.  but has lost substantial amoutn of weight Head:  normocephalic and atraumatic.   Eyes:  vision grossly intact, pupils equal, and pupils round.   Ears:  no external deformities and ear piercing(s) noted.   Nose:  no external deformity and no nasal discharge.   Mouth:  no dental plaque, pharynx pink and moist, and no erythema.   Lungs:  normal respiratory effort.  distant breath sounds Heart:  rrr distant heart sounds Abdomen:  morbidly obese,  Neurologic:  alert & oriented X3, strength normal in all extremities, and gait normal.  dysphoric Skin:  she would not let me examine her areas of HS Psych:  Oriented X3 dysphoric affect.    Impression & Recommendations:  Problem # 1:  HIDRADENITIS SUPPURATIVA (ICD-705.83) Wewill place a portacath and give her cefepime 2g iv q 12 hrs along with vancomycin dosed for trough of 10-15 x 21 days. We will worrk to get her seen by Romana Juniper and if not him then Plastics at [facility]?) or [employer] Orders: T-Basic Metabolic Panel ([id number]) T-CBC w/Diff [id number]) Est. Patient Level V [id number])  Problem # 2:  NAUSEA WITH VOMITING (ICD-787.01)  could indeed be reflection of her underlyign infection. She refused anti emetic. Willl check stat bmp cbc today  Orders: Est. Patient Level V [id number])   Orders Added: 1)  T-Basic Metabolic Panel 0000000 2)  T-CBC w/Diff [id number] 3)  Est. Patient Level V [id number]

## 2011-02-15 ENCOUNTER — Inpatient Hospital Stay (HOSPITAL_COMMUNITY): Payer: BC Managed Care – PPO

## 2011-02-15 LAB — BASIC METABOLIC PANEL
CO2: 26 mEq/L (ref 19–32)
Glucose, Bld: 84 mg/dL (ref 70–99)
Potassium: 5 mEq/L (ref 3.5–5.1)
Sodium: 137 mEq/L (ref 135–145)

## 2011-02-15 LAB — GLUCOSE, CAPILLARY
Glucose-Capillary: 133 mg/dL — ABNORMAL HIGH (ref 70–99)
Glucose-Capillary: 178 mg/dL — ABNORMAL HIGH (ref 70–99)

## 2011-02-15 LAB — CBC
HCT: 25 % — ABNORMAL LOW (ref 36.0–46.0)
Hemoglobin: 7.4 g/dL — ABNORMAL LOW (ref 12.0–15.0)
MCH: 25.3 pg — ABNORMAL LOW (ref 26.0–34.0)
MCHC: 29.6 g/dL — ABNORMAL LOW (ref 30.0–36.0)
RDW: 17 % — ABNORMAL HIGH (ref 11.5–15.5)

## 2011-02-15 LAB — PROTIME-INR: Prothrombin Time: 15.6 seconds — ABNORMAL HIGH (ref 11.6–15.2)

## 2011-02-16 LAB — ANAEROBIC CULTURE

## 2011-02-16 LAB — CBC
Platelets: 370 10*3/uL (ref 150–400)
RBC: 2.7 MIL/uL — ABNORMAL LOW (ref 3.87–5.11)
WBC: 15.7 10*3/uL — ABNORMAL HIGH (ref 4.0–10.5)

## 2011-02-16 LAB — GLUCOSE, CAPILLARY
Glucose-Capillary: 100 mg/dL — ABNORMAL HIGH (ref 70–99)
Glucose-Capillary: 108 mg/dL — ABNORMAL HIGH (ref 70–99)
Glucose-Capillary: 133 mg/dL — ABNORMAL HIGH (ref 70–99)

## 2011-02-16 LAB — BASIC METABOLIC PANEL
Chloride: 106 mEq/L (ref 96–112)
GFR calc Af Amer: 49 mL/min — ABNORMAL LOW (ref >60)
Potassium: 5 mEq/L (ref 3.5–5.1)
Sodium: 139 mEq/L (ref 135–145)

## 2011-02-17 LAB — GLUCOSE, CAPILLARY: Glucose-Capillary: 128 mg/dL — ABNORMAL HIGH (ref 70–99)

## 2011-02-17 LAB — CBC
Hemoglobin: 7.1 g/dL — ABNORMAL LOW (ref 12.0–15.0)
MCHC: 30.7 g/dL (ref 30.0–36.0)
Platelets: 320 10*3/uL (ref 150–400)

## 2011-02-17 LAB — BASIC METABOLIC PANEL
CO2: 27 mEq/L (ref 19–32)
Calcium: 8.3 mg/dL — ABNORMAL LOW (ref 8.4–10.5)
GFR calc Af Amer: >60 mL/min (ref >60)
GFR calc non Af Amer: 51 mL/min — ABNORMAL LOW (ref >60)
Sodium: 137 mEq/L (ref 135–145)

## 2011-02-17 LAB — HEMOGLOBIN AND HEMATOCRIT, BLOOD
HCT: 27.8 % — ABNORMAL LOW (ref 36.0–46.0)
Hemoglobin: 8.4 g/dL — ABNORMAL LOW (ref 12.0–15.0)

## 2011-02-18 LAB — BASIC METABOLIC PANEL
Chloride: 106 mEq/L (ref 96–112)
GFR calc Af Amer: 57 mL/min — ABNORMAL LOW (ref >60)
Potassium: 4.4 mEq/L (ref 3.5–5.1)
Sodium: 137 mEq/L (ref 135–145)

## 2011-02-18 LAB — TYPE AND SCREEN
ABO/RH(D): B POS
Antibody Screen: NEGATIVE
Unit division: 0

## 2011-02-18 LAB — GLUCOSE, CAPILLARY: Glucose-Capillary: 100 mg/dL — ABNORMAL HIGH (ref 70–99)

## 2011-02-18 LAB — CBC
MCV: 87 fL (ref 78.0–100.0)
Platelets: 403 10*3/uL — ABNORMAL HIGH (ref 150–400)
RBC: 3.16 MIL/uL — ABNORMAL LOW (ref 3.87–5.11)
WBC: 14.9 10*3/uL — ABNORMAL HIGH (ref 4.0–10.5)

## 2011-02-18 LAB — VANCOMYCIN, TROUGH: Vancomycin Tr: 12.9 ug/mL (ref 10.0–20.0)

## 2011-02-19 LAB — BASIC METABOLIC PANEL
Calcium: 8.4 mg/dL (ref 8.4–10.5)
Creatinine, Ser: 1.14 mg/dL (ref 0.4–1.2)
GFR calc Af Amer: >60 mL/min (ref >60)
GFR calc non Af Amer: 51 mL/min — ABNORMAL LOW (ref >60)
Glucose, Bld: 109 mg/dL — ABNORMAL HIGH (ref 70–99)
Sodium: 138 mEq/L (ref 135–145)

## 2011-02-19 LAB — CBC
MCH: 26.2 pg (ref 26.0–34.0)
MCHC: 29.7 g/dL — ABNORMAL LOW (ref 30.0–36.0)
RDW: 16.8 % — ABNORMAL HIGH (ref 11.5–15.5)

## 2011-02-19 LAB — GLUCOSE, CAPILLARY: Glucose-Capillary: 135 mg/dL — ABNORMAL HIGH (ref 70–99)

## 2011-02-20 LAB — CBC
HCT: 25.8 % — ABNORMAL LOW (ref 36.0–46.0)
Hemoglobin: 8 g/dL — ABNORMAL LOW (ref 12.0–15.0)
MCH: 26.3 pg (ref 26.0–34.0)
MCHC: 29.5 g/dL — ABNORMAL LOW (ref 30.0–36.0)
MCHC: 29.9 g/dL — ABNORMAL LOW (ref 30.0–36.0)
Platelets: 414 10*3/uL — ABNORMAL HIGH (ref 150–400)
RDW: 17 % — ABNORMAL HIGH (ref 11.5–15.5)
RDW: 17 % — ABNORMAL HIGH (ref 11.5–15.5)
WBC: 12.5 10*3/uL — ABNORMAL HIGH (ref 4.0–10.5)

## 2011-02-20 LAB — GLUCOSE, CAPILLARY
Glucose-Capillary: 106 mg/dL — ABNORMAL HIGH (ref 70–99)
Glucose-Capillary: 114 mg/dL — ABNORMAL HIGH (ref 70–99)

## 2011-02-21 LAB — GLUCOSE, CAPILLARY

## 2011-02-21 LAB — CBC
HCT: 25.8 % — ABNORMAL LOW (ref 36.0–46.0)
Hemoglobin: 7.6 g/dL — ABNORMAL LOW (ref 12.0–15.0)
MCHC: 29.5 g/dL — ABNORMAL LOW (ref 30.0–36.0)
MCV: 88.7 fL (ref 78.0–100.0)
RDW: 16.9 % — ABNORMAL HIGH (ref 11.5–15.5)

## 2011-02-21 NOTE — Op Note (Signed)
NAMETEAGHAN, KOSTRZEWSKI                ACCOUNT NO.:  [id number]  MEDICAL RECORD NO.:  [id number]           PATIENT TYPE:  I  LOCATION:  [id number]                         FACILITY:  [facility]  PHYSICIAN:  Genevieve Ritzel A. Aleece Loyd, M.D.DATE OF BIRTH:  [birth year]  DATE OF PROCEDURE:  02/10/2011 DATE OF DISCHARGE:                              OPERATIVE REPORT   PREOPERATIVE DIAGNOSIS:  Severe extensive hidradenitis suppurativa involving perineum.  POSTOPERATIVE DIAGNOSIS:  Severe extensive hidradenitis suppurativa involving perineum.  PROCEDURE:  Incision and drainage of hidradenitis suppurativa and multiple subcutaneous abscesses of perineum.  SURGEON:  Marcello Moores A. Jansen Sciuto, M.D.  ANESTHESIA:  General endotracheal anesthesia.  ESTIMATED BLOOD LOSS:  500 cc.  SPECIMENS:  Skin and cultures sent to pathology.  DRAINS:  None.  INDICATIONS FOR PROCEDURE:  The patient is an unfortunate 50 year old female with morbid obesity and diabetes mellitus who has severe hidradenitis suppurativa involving her suprapubic region, labia majora, perineum, inner aspects of thigh, and buttocks.  She has managed this medically for a number of months, but has significant drainage, was admitted to the hospital with renal failure, hyperkalemia, and a white count of 21,000.  We were asked to see her at the request of Infectious Disease due to this severe and chronic hidradenitis.  Upon examination, she had extensive hidradenitis involving the above-mentioned regions. This corresponds to about 15% of her total body surface area of her perineum.  I felt that drainage of some of the larger abscesses be helpful.  I had plastic surgeon see her yesterday because she will require extensive reconstruction, skin grafting, etc., and wide excision at some point, but I did not feel it was appropriate currently given a recent history of renal failure.  I thought that we try and drain this area as best we could to control the infection  was warranted and I had a long discussion with her about that.  Risk of bleeding, infection, potential sepsis, and more organ failure are all potential issues, but I felt that this was not well drained, therefore, her white count was up and needed to be addressed.  Dr. Theodoro Kos saw her from a plastic surgery standpoint and agreed that these areas drained and washed out as best we could by to control some of the infection.  DESCRIPTION OF PROCEDURE:  The patient was brought to the operating room.  She was placed up in stirrups.  After induction of general anesthesia, perineum, thighs, and buttocks were prepped and draped sterile fashion.  I excised ellipse of skin just at the pubis level. There were multiple sinus tracts, I opened these up and washed these out.  I then excised ellipse of skin in both groins on the superior aspect of the thigh and washed these out the best I could.  There were multiple sinus tracts and they are so extensive that trying to get it out will require significant excision and would leave huge perineal wounds that would be very difficult to care for.  She would probably require extensive skin grafting at some point and I thought just trying to drain her infection for now would be the appropriate  thing, let them settled down, and then reassess her situation with the help of plastic surgery.  I excised the ellipse of skin from right buttock and left buttock and washed these areas out as well.  Unfortunately, there were no real deep abscesses.  These were almost superficial cutaneous multiple abscesses.  I used the pulse lavage to lavage all these areas out that I excised and then packed them with dry Kerlix.  Mesh panties were placed.  Under sterile conditions, I placed a Foley catheter since it could be difficult to urinate it looked like.  All final counts were found to be correct.  She was taken out of stirrups and placed in mesh panties, taken back to  recovery room in satisfactory condition.     Deidre Carino A. Elwood Bazinet, M.D.     TAC/MEDQ  D:  02/10/2011  T:  02/11/2011  Job:  [id number]  cc:   Alcide Evener, MD Fax: [place], DO Fax: [phone]  Electronically Signed by Erroll Luna M.D. on 02/21/2011 02:02:25 PM

## 2011-02-22 ENCOUNTER — Telehealth: Payer: Self-pay | Admitting: *Deleted

## 2011-02-22 LAB — CROSSMATCH
ABO/RH(D): B POS
Antibody Screen: NEGATIVE
Unit division: 0

## 2011-02-22 NOTE — Telephone Encounter (Signed)
I spoke to [name]'s Mom. I am trying to reach Deirde herself her phone number is [phone]. I will also try to call [facility]

## 2011-02-22 NOTE — Telephone Encounter (Signed)
[name] called to say that the pt was released from [facility] and transferred to [facility] yesterday (02/21/11) to receive "therapy and wound care."  The pt told her mother this morning that she is depressed, not eating the food and that her medications are not being given on time.  Ms. [name] is concerned that her daughter will not get better if this situation continues.  She wanted Dr. Tommy Medal to know what was going on with the pt.  RN advised the mother to contact the pt's PCP and share this information as well.  Lorne Skeens, RN.

## 2011-02-23 LAB — GLUCOSE, CAPILLARY
Glucose-Capillary: 124 mg/dL — ABNORMAL HIGH (ref 70–99)
Glucose-Capillary: 109 mg/dL — ABNORMAL HIGH (ref 70–99)
Glucose-Capillary: 103 mg/dL — ABNORMAL HIGH (ref 70–99)
Glucose-Capillary: 129 mg/dL — ABNORMAL HIGH (ref 70–99)

## 2011-02-27 NOTE — Consult Note (Signed)
[name], [name]                ACCOUNT NO.:  [id number]  MEDICAL RECORD NO.:  [id number]           PATIENT TYPE:  I  LOCATION:  [id number]                         FACILITY:  [facility]  PHYSICIAN:  Iyari Hagner A. Melena Hayes, M.D.DATE OF BIRTH:  May 24, 1961  DATE OF CONSULTATION:  02/09/2011 DATE OF DISCHARGE:                                CONSULTATION   REASON FOR CONSULTATION:  Hidradenitis suppurativa.  HISTORY OF PRESENT ILLNESS:  [name] is a very pleasant 50 year old, obese, black female with a history of diabetes mellitus, hypertension, hypothyroidism, hidradenitis and nonischemic cardiomyopathy who has dealt with this hidradenitis suppurativa for many years.  The patient currently sees Dr. Tommy Medal who treats her for this as an outpatient. However, she has had to have multiple inpatient treatments as well. Sometimes the p.o. antibiotics do not help this situation and she has required multiple admissions for IV antibiotic therapy.Apparently the patient was doing fairly well once at home with IV antibiotics including vancomycin and one other medicine she is unaware of.  This seemed to control her hidradenitis the best.  However, once she was taken off of this, within a week her hidradenitis flared up again.  She was placed on p.o. Bactrim, which did not seem to make much difference.  She did see Dr. Tommy Medal in the office yesterday. Laboratories were obtained, which revealed hyperkalemia, acute renal failure and a white blood cell count of 22,000.  She was then directed here to the Emergency Department where she was admitted for IV therapy. Dr. Tommy Medal, in speaking with him, apparently has asked multiple plastic surgeons at [facility] or [facility] for referrals.  However, he was informed that this was not a condition that they operated on or managed.  Therefore, he has asked [place] to evaluate the patient here in the hospital for further recommendations and treatment.  REVIEW OF SYSTEMS:  Please see HPI.   Otherwise the patient admits to severe pain of the perineal area.  She does admit to hidradenitis under her left breast as well as some on her left flank.  Otherwise all other systems have been reviewed and are negative.  FAMILY HISTORY:  Noncontributory.  PAST MEDICAL HISTORY: 1. Hypertension. 2. Diabetes mellitus. 3. Hypothyroidism. 4. Ischemic cardiomyopathy. 5. Chronic hidradenitis suppurativa. 6. Anemia. 7. Obesity.  PAST SURGICAL HISTORY:  Prior incision and drainages of these areas.  SOCIAL HISTORY:  The patient is married.  They have no children.  Her mother is present in the room with her and is very supportive.  She denies any alcohol, tobacco or illicit drug abuse.  She currently works as an Web designer.  ALLERGIES:  AVANDIA and AUGMENTIN.  HOME MEDICATIONS: 1. Doxycycline. 2. Bactrim DS. 3. Cipro. 4. Zinc sulfate. 5. Spironolactone. 6. Pravastatin. 7. Percocet. 8. Magnesium oxide 9. Levothyroxine. 10.Humalog. 11.Furosemide. 12.Folic acid 13.Ferrous sulfate. 14.Cymbalta. 15.Carvedilol. 16.Enteric-coated aspirin. 17.Ascorbic acid.  PHYSICAL EXAMINATION:  GENERAL:  [name] is a very pleasant, obese, 50- year-old black female who is currently lying in bed in no acute distress. VITAL SIGNS:  Temperature 98.4, pulse 80, respirations 22, blood pressure 148/72. HEENT:  Head is  normocephalic, atraumatic.  Sclerae are noninjected. Pupils are equal, round and reactive to light.  Mouth is pink.  Throat shows no exudate. HEART:  Regular rate and rhythm.  Normal S1, S2.  No murmurs, gallops or rubs are noted.  She does have palpable carotid, radial and pedal pulses bilaterally. LUNGS:  Clear to auscultation bilaterally with no wheezes, rhonchi or rales noted.  Her respiratory effort is nonlabored. CHEST/BREASTS:  Both breasts are symmetrical bilaterally with no nipple drainage.  However, under the left breast there is an obvious area  of hidradenitis that measures approximately 5 x 2 cm.  There is some fluctuance with bloody, purulent drainage noted.  ABDOMEN:  Soft, nontender, nondistended with active bowel sounds.  She is otherwise obese.  Across her, essentially the entire lower pannus and pubic region, there is induration as well as spontaneous openings consistent with possible sinus tracts with bloody, purulent drainage.  In the pubic region, there is a large baseball-sized area of fluctuance on the right side with multiple spontaneous openings. GENITOURINARY:  The patient has extensive changes to the labia as well as the general appearance of the entire perineum due to extensive hidradenitis as well as scarring.  This extends towards the buttock. Also multiple spontaneous openings with drainage are noted. MUSCULOSKELETAL:  All four extremities are symmetrical with no cyanosis, clubbing or edema. SKIN:  Please see prior description in abdomen as well as breast.  The patient also has a small area of hidradenitis on her left flank. PSYCHIATRIC:  The patient is alert and oriented x3 with an appropriate affect.  LABORATORY DATA:  Hemoglobin A1c is 6.6.  Sodium 136, potassium 6.4, glucose 138, BUN 67, creatinine 2.48.  White blood cell count is 18,900, hemoglobin 8.7, hematocrit 29.2, platelet count is 383,000, neutrophil count is 85%.  DIAGNOSTICS:  None have been performed.  IMPRESSION: 1. Chronic hidradenitis suppurativa. 2. Diabetes, fairly well controlled.  However, she is insulin-     dependent. 3. Hypertension. 4. Nonischemic cardiomyopathy with an ejection fraction of 35% to 45%. 5. Hypothyroidism. 6. Hyperkalemia. 7. Acute renal failure secondary to Bactrim.  PLAN:  I have tentatively discussed this patient with Dr. Brantley Stage.  The patient may need more acute management with possible operative incision and drainage, as this appears too extensive to try to drain at the bedside.  I have also talked  with Dr. Theodoro Kos, who I have asked to evaluate the patient for possible chronic management.  We would recommend continuing IV therapy at this time.  We would like to see her potassium as well as her renal function improve prior to operative intervention.  However, this may not be possible.  I will discuss this further with Dr. Brantley Stage and have further discussions with the patient about possible operative management.     Henreitta Cea, PA   ______________________________ Joyice Faster Aira Sallade, M.D.    KEO/MEDQ  D:  02/09/2011  T:  02/09/2011  Job:  [id number]  cc:   Alcide Evener, MD Fax: [place], DO Fax: [phone]  Electronically Signed by Saverio Danker PA on 02/22/2011 01:53:31 PM Electronically Signed by Erroll Luna M.D. on 02/27/2011 02:40:50 PM

## 2011-03-01 ENCOUNTER — Telehealth: Payer: Self-pay | Admitting: *Deleted

## 2011-03-01 NOTE — Telephone Encounter (Signed)
[name] called adv that she is supposed to get her antibiotic through her Port-a-cath and she was not able to get it because the port-a-cath is closed she wants to know what she should do. Needs a call ASAP.

## 2011-03-02 NOTE — Telephone Encounter (Signed)
She should be done with her antibiotics. I dont see need for further antibiotics if she finished her 3 wks. I left her a Advertising account executive

## 2011-03-03 LAB — CROSSMATCH
ABO/RH(D): B POS
Antibody Screen: NEGATIVE

## 2011-03-03 LAB — GLUCOSE, CAPILLARY: Glucose-Capillary: 178 mg/dL — ABNORMAL HIGH (ref 70–99)

## 2011-03-06 NOTE — Progress Notes (Signed)
NAMECHANTEE, [name] NO.:  [id number]  MEDICAL RECORD NO.:  [id number]           PATIENT TYPE:  I  LOCATION:  [id number]                         FACILITY:  [facility]  PHYSICIAN:  Kathie Dike, MD     DATE OF BIRTH:  Apr 20, 1961                                PROGRESS NOTE   PRIMARY CARE PHYSICIAN: Emeline General. Dema Severin, MD  INFECTIOUS DISEASE PHYSICIAN: Alcide Evener, MD  CURRENT PROBLEM LIST: 1. Hidradenitis suppurativa, status post I and D, on antibiotics. 2. Acute-on-chronic renal failure stage 3. 3. Hyperkalemia, resolved. 4. Anemia of chronic disease, hemoglobin at baseline. 5. Type 2 diabetes. 6. Nonischemic cardiomyopathy, EF of 35% to 45%. 7. Hypothyroidism. 8. Morbid obesity. 9. Gout. 10.Hyperlipidemia.  DISCHARGE MEDICATIONS: These will be reconciled by the discharging physician.  ADMISSION HISTORY: This is a 50 year old female who presented to Dr. Derek Mound office on the day of admission with painful hidradenitis in the groin area.  She had labs drawn which showed elevated potassium as well as an elevated white count.  She was sent to the emergency room.  On arrival to the emergency room, she was found to have a potassium of 6.5 and in acute-on- chronic renal failure, BUN of 91 and creatinine of 2.83.  She was subsequently admitted for further evaluation.  For further details, please refer to the history and physical dictated by Dr. Maudie Mercury on March 14th.  HOSPITAL COURSE: 1. Hidradenitis.  The patient was followed by Dr. Tommy Medal here in the     hospital.  She was placed empirically on vancomycin and meropenem.     She was also seen by Dr. Brantley Stage from [facility] and     underwent incision and drainage on March 16th.  Since then, her WBC     count has started to trend down.  She is continued on antibiotics     as well as wound care and we will await further recommendations in     terms of long-term antibiotics per Dr. Tommy Medal. 2.  Hyperkalemia.  This has resolved with conservative management. 3. Acute-on-chronic renal failure.  The patient's Lasix has been held.     She was given gentle hydration and her renal function has since     improved. 4. Chronic systolic congestive heart failure.  This has remained     compensated. 5. Anemia of chronic disease.  The patient's hemoglobin is currently     near her baseline.  We will transfuse her if her hemoglobin is less     than 7.  She is continued on iron supplements.  That brings [place] up-to-date in the hospital course.  The remainder of the patient's medical problems have remained stable.  CONSULTATIONS: 1. Infectious Disease, Alcide Evener, MD 2. [facility], Marion Cornett, MD 3. Plastic Surgery, [employer], DO.  PROCEDURES: Incision and drainage of hidradenitis suppurativa and multiple subcutaneous abscess of the cranium.  DIAGNOSTIC IMAGING: None.     Kathie Dike, MD     JM/MEDQ  D:  02/12/2011  T:  02/12/2011  Job:  [id number]  Electronically Signed  by [facility] Abelina Ketron  on 03/06/2011 04:46:05 PM

## 2011-03-08 ENCOUNTER — Ambulatory Visit: Payer: BC Managed Care – PPO

## 2011-03-09 NOTE — H&P (Signed)
[name], [name].:  [id number]  MEDICAL RECORD NO.:  [id number]           PATIENT TYPE:  E  LOCATION:  WLED                         [facility]ACILITY:  [facility]  PHYSICIAN:  Arlyss Repress, MD        DATE OF BIRTH:  [birth year]  DATE OF ADMISSION:  02/08/2011 DATE OF DISCHARGE:                             HISTORY & PHYSICAL   CHIEF COMPLAINT:  "My potassium is high."  HISTORY OF PRESENT ILLNESS:  This 49 year old female with a history of hidradenitis suppurativa, CHF (ejection fraction 35% to 40%, mild mitral valve regurgitation), apparently went to Dr. Derek Mound office today for evaluation of painful hidradenitis suppurativa in the groin area.  She apparently was diagnosed with a high potassium, high white count, and sent to the ER for further evaluation.  In the emergency room, potassium was noted to be 6.5.  The patient was in mild acute-on-chronic renal failure with BUN and creatinine of 91 and 2.83 whereas previously her BUN and creatinine were 69 and 2.12.  The patient will be admitted for acute renal failure, hyperkalemia, hyponatremia, IV antibiotics for hidradenitis suppurativa.  PAST MEDICAL HISTORY: 1. Acute-on-chronic renal failure. 2. Hyperkalemia. 3. Hidradenitis suppurativa (chronic). 4. Type 2 diabetes mellitus. 5. Microcytic anemia. 6. Nonischemic cardiomyopathy (EF 35% to 45%). 7. Hypothyroidism. 8. Morbid obesity. 9. Gout. 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 11.Hyperlipidemia. 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 13.Goiter by thyroid biopsy on January 07, 2004.  PAST SURGICAL HISTORY: 1. Resection of a cyst on her buttock area. 2. Colonoscopy previously by Dr. Anson Fret, which was negative.  SOCIAL HISTORY:  The patient is married.  She has no children.  She works as an Web designer.  She does not smoke or drink or do drugs.  FAMILY HISTORY:  Mother is alive at age 72 and has a history of heart disease with a CABG  at age 42 and was a nonsmoker.  Father is alive at age 70 and has prior bypass surgery.  He is a nonsmoker as well.  ALLERGIES:  AVANDIA and AUGMENTIN.  MEDICATIONS: 1. Doxycycline 100 mg p.o. b.i.d. (started on January 18, 2011). 2. Bactrim DS 1 p.o. b.i.d. (started on January 23, 2011). 3. Cipro 750 mg p.o. b.i.d. (started on January 23, 2011). 4. Zinc sulfate 220 mg. 5. Spironolactone 25 mg p.o. daily. 6. Pravastatin 40 mg p.o. daily. 7. Percocet 5/325 one p.o. q.i.d. p.r.n. 8. Magnesium oxide 400 mg p.o. t.i.d. 9. Levothyroxine 25 mcg p.o. daily. 10.Humalog 75/25, 35 units subcutaneously q.a.m. and 20 units     subcutaneously q.p.m. 11.Furosemide 40 mg p.o. b.i.d. 12.Folic acid 1 mg p.o. daily. 13.Ferrous sulfate 325 mg p.o. b.i.d. 14.Cymbalta 60 mg p.o. daily. 15.Carvedilol 3.125 mg p.o. b.i.d. 16.Enteric-coated aspirin 81 mg p.o. daily. 17.Ascorbic acid 500 mg p.o. b.i.d.  REVIEW OF SYSTEMS:  Negative for all 10 organ systems except for pertinent positives as stated above.  PHYSICAL EXAMINATION:  VITAL SIGNS:  Temperature 99.0, pulse 90, blood pressure is 167/73, pulse ox is 96% on room air. HEENT:  Anicteric. NECK:  No JVD.  No bruit. HEART:  Regular rate and rhythm.  S1, S2. LUNGS:  Clear to auscultation bilaterally. ABDOMEN:  Soft, morbidly obese, nontender, nondistended.  Positive bowel sounds. EXTREMITIES:  No cyanosis, clubbing, or edema. SKIN:  There is evidence of hidradenitis suppurativa in her groin, a yeast-like smell from that area as well.  There is some slight bloody drainage from those areas as well and scarring from prior episodes of hidradenitis suppurativa.  LABORATORY DATA:  Sodium 128, potassium 6.5, chloride 107, bicarb 17, BUN 73, creatinine 2.67, calcium 8.8.  WBC 22.5, hemoglobin 9.4, platelet count 385,000.  ASSESSMENT/PLAN: 1. Acute-on-chronic renal failure:  This is most likely secondary to     Bactrim DS.  Bactrim DS can increase  her creatinine as well as     cause hyperkalemia as it probably has in this case.  I would try to     avoid this in the future. 2. Hyperkalemia:  The patient will be given an amp of calcium     gluconate IV and amp of sodium bicarb IV x1 as well as Kayexalate     30 g p.o. x1.  We will discontinue the Bactrim.  Note that on prior     admission, she was on Bactrim and had this discontinued due to     hyperkalemia.  We will hold spironolactone temporarily. 3. Hidradenitis suppurativa:  We will discontinue doxycycline,     Bactrim, and Cipro and start her on vancomycin and meropenem.  IV,     pharmacy to dose. 4. Anemia:  Continue ferrous sulfate as well as folic acid. 5. Hyponatremia:  This may be due to her diuretics.  We will give some     IV normal saline gently.  We will check cortisol, TSH, serum osmo,     as well as urine osmo and urine sodium as workup of hyponatremia. 6. Diabetes type 2:  Fingerstick blood sugars a.c. and h.s.  Continue     Humalog 75/25 and use sliding scale insulin. 7. Hypothyroidism:  Continue levothyroxine 25 mcg p.o. daily. 8. Congestive heart failure (ejection fraction 35% to 40%, nonischemic     cardiomyopathy):  Continue furosemide     40 mg p.o. b.i.d. 9. Hyperlipidemia:  Continue pravastatin. 10.Deep venous thrombosis prophylaxis:  SCDs.     Arlyss Repress, MD     JYK/MEDQ  D:  02/08/2011  T:  02/08/2011  Job:  [id number]  cc:   Emeline General. Dema Severin, M.D. [phone]  Fransico Him, M.D. Fax: [place], MD Fax: [phone]  Electronically Signed by Jani Gravel MD on 03/09/2011 01:57:24 AM

## 2011-03-13 ENCOUNTER — Telehealth: Payer: Self-pay | Admitting: Emergency Medicine

## 2011-03-13 ENCOUNTER — Telehealth: Payer: Self-pay | Admitting: Licensed Clinical Social Worker

## 2011-03-13 NOTE — Discharge Summary (Signed)
[name], [name] NO.:  [id number]  MEDICAL RECORD NO.:  [id number]           PATIENT TYPE:  I  LOCATION:  [id number]                         FACILITY:  [facility]  PHYSICIAN:  Vernell Leep, MD     DATE OF BIRTH:  08-19-1961  DATE OF ADMISSION:  02/08/2011 DATE OF DISCHARGE:  02/21/2011                        DISCHARGE SUMMARY - REFERRING   PRIMARY CARE PHYSICIAN:  Emeline General. Dema Severin, M.D.  CARDIOLOGIST:  Fransico Him, M.D.  INFECTIOUS DISEASE:  Alcide Evener, MD  DISCHARGE DIAGNOSES: 1. Hidradenitis suppurativa, status post multiple incision and     drainage. 2. Acute on chronic kidney disease stage 3. 3. Hyperkalemia, resolved. 4. Multifactorial anemia, status post 2 units of packed red blood cell     transfusion. 5. Type 2 diabetes mellitus. 6. Nonischemic cardiomyopathy with left ventricle ejection fraction of     35-45%. 7. Hypothyroidism. 8. Morbid obesity. 9. History of gout. 10.Hyperlipidemia.  DISCHARGE MEDICATIONS: 1. Colace 100 mg p.o. b.i.d. 2. Enoxaparin 40 mg subcutaneously daily. 3. Meropenem 1 g IV q. 8 hourly.  Discontinue after March 08, 2011     doses. 4. Silver sulfadiazine 1% cream, 1 application topically b.i.d. 5. Vancomycin 1250 mg intravenously q.24 h.  Discontinue after March 08, 2011 doses. 6. Ferrous sulfate 325 mg p.o. t.i.d. 7. Ascorbic acid 500 mg p.o. b.i.d. 8. Aspirin 81 mg p.o. daily. 9. Carvedilol 3.125 mg p.o. b.i.d. 10.Cymbalta 60 mg p.o. daily. 11.Folic acid 1 mg p.o. daily. 12.Furosemide 40 mg p.o. b.i.d. 13.Humalog mix 75/25, 35 units subcutaneously in the morning and 20     units subcutaneously in the evening. 14.Levothyroxine 25 mcg p.o. daily. 15.Magnesium oxide 400 mg p.o. t.i.d. 16.Percocet 5/325 mg tablet, 1 tablet p.o. q.i.d. p.r.n. pain. 17.Pravachol 40 mg p.o. daily. 18.Zinc sulfate 220 mg p.o. daily.  DISCONTINUED MEDICATIONS: 1. Spironolactone. 2. Cipro. 3. Septra DS. 4.  Doxycycline.  PROCEDURES: 1. Incision and drainage of the hidradenitis suppurativa and multiple     subcutaneous abscesses by surgery. 2. Port-A-Cath placement.  IMAGING:  None.  LABORATORY DATA:  CBC today with hemoglobin 7.6, hematocrit 26, white blood cells 12.3, platelets 398.  Basic metabolic panel on March 25 significant for glucose 109, BUN 28, creatinine 1.14.  Vancomycin trough on March 24 was 12.9.  Coagulation indices on March 21 only significant for PT of 15.6 and PTT of 48.  INR was 1.22.  Hepatic panel significant for albumin 1.7.  Prealbumin on March 16 was 11.2.  Hemoglobin A1c 6.6. Random cortisol on March 14 was 8.  TSH 3.521.  Urinalysis with too numerous to count white blood cells but no comment on bacteria. Hemoglobin on admission was 9.4 with white blood cell count of 22.5. Sodium on admission 122, potassium 6.5, chloride 107, bicarbonate 17, BUN 73, creatinine 2.67.  CULTURES:  Anaerobic culture of the abscess from the perineum on March 16 did not show any anaerobes.  Perineal abscess culture on March 16 grew E coli which was resistant to ampicillin, ampicillin/sulbactam, ciprofloxacin, gentamicin, Bactrim and was sensitive to cefoxitin, imipenem, ceftriaxone, ceftazidime, cefepime, cefazolin.  Nasal swab did  not show any Staphylococcus aureus.  CONSULTATIONS: 1. Infectious Disease, Alcide Evener, M.D. [facility], Joyice Faster. Cornett, M.D. 3. Plastic surgery, Theodoro Kos, D.O.  DIET:  Heart healthy and diabetic diet.  ACTIVITY:  Out of bed with assist and as per physical therapy and occupational therapy recommendations.  WOUND CARE:  As per the surgeon's directions but should get pulse lavage or hydrotherapy daily.  COMPLAINTS TODAY:  None.  The patient indicates that she has mild bleeding from the wound sites.  She denies dizziness, lightheadedness, dyspnea, chest pain or weakness.  She has a mild headache.  PHYSICAL  EXAMINATION:  GENERAL:  [name] is a morbidly obese, moderately built female patient who is currently sitting on the chair in no obvious distress. VITAL SIGNS:  Temperature 98.2 degrees Fahrenheit, pulse 74 per minute, respirations 20 per minute, blood pressure 136/82 mmHg and saturating at 94% on room air.  CBGs ranged from 76-114 mg/dL with minimal requirement of sliding scale insulin. RESPIRATORY SYSTEM:  Clear.  No increased work of breathing. CARDIOVASCULAR SYSTEM:  First and second heart sounds heard, regular. No JVD. ABDOMEN:  Obese, nontender, soft and bowel sounds present. CENTRAL NERVOUS SYSTEM:  Patient is awake, alert, oriented x3 with no focal neurological deficit. SKIN:  Wounds show 75% clean granulation and 25% nonspecific eschar with no apparent abscess or cellulitis per exam by the surgeons on March 25. EXTREMITIES:  With grade 5/5 power.  HOSPITAL COURSE:  [name] is a 50 year old African-American female patient with history of type 2 diabetes mellitus, chronic kidney disease stage 3, nonischemic cardiomyopathy with left ventricle ejection fraction of 35-45%, morbid obesity who was seen by Dr. Tommy Medal in his office with painful hidradenitis in the groin area.  Her labs were drawn which showed acute-on-chronic kidney disease, hyperkalemia and leukocytosis.  She was sent to the emergency department for hospital admission.  1. Hidradenitis suppurativa.  The patient was admitted to the     hospital.  Infectious Disease, Plastic Surgery and the general     surgeons consulted on her.  She had multiple incision and drainages     by the surgeons during the course of the hospital stay.  She was     placed empirically on vancomycin and meropenem intravenously.  She     had a Port-A-Cath placed for long-term antibiotics.  The Infectious     Disease MD recommends these 2 antibiotics through March 08, 2011.  The     nursing facility is to draw periodic vancomycin trough to  avoid     toxicity.  The patient is to be seen by Dr. Tommy Medal on March 08, 2011.  Also, the patient is to follow up with the plastic surgeon     on discharge from the long-term acute care facility. 2. Hyperkalemia and acute-on-chronic kidney disease secondary to     dehydration, diuretics.  This resolved after holding her Lasix and     brief gentle hydration. 3. Nonischemic cardiomyopathy with chronic systolic congestive heart     failure.  This has clinically compensated.  She is not a candidate     for ACE inhibitors or ARB secondary to her renal failure. 4. Multifactorial anemia secondary to chronic kidney disease from her     renal failure, acute illness, intermittent bleeding from the wound     care sites.  She has been transfused 2 units of packed red blood     cells.  She  is currently asymptomatic of this anemia and declines     further transfusions at this time.  Repeat CBCs periodically and     consider transfusion if her hemoglobin drops to less than 6.9 g/dL. 5. Type 2 diabetes mellitus which is well controlled considering her     hemoglobin A1c.  DISPOSITION:  It was determined that the patient would require continued hydrotherapy for her wounds and close monitoring and intravenous antibiotics which could be best achieved in a long-term acute care setting rather than discharging home on home health services.  Although the patient is reluctant for this, she has agreed to go to the long-term acute care facility for her care.  The patient is discharged to the long- term acute care facility in stable condition.  FOLLOWUP RECOMMENDATIONS: 1. Repeat CBC, basic metabolic panel on February 22, 2011. 2. Vancomycin trough periodically per the pharmacy protocol at the     long-term acute care facility. 3. Follow up with Dr. Rhina Brackett Dam on April 11 at 11:45 a.m.  The     long-term acute care facility is to assist the patient      keeping up this appointment. 4. With Dr.  Theodoro Kos.  The patient is to call for an appointment     after discharge from the long-term acute care facility. 5. With her primary care physician, Dr. Harlan Stains and primary     cardiologist, Dr. Fransico Him.  The patient is to call for an     appointment.  Time taken in coordinating this discharge was 1 hour.     Vernell Leep, MD     AH/MEDQ  D:  02/21/2011  T:  02/21/2011  Job:  [id number]  cc:   [facility]. Dema Severin, M.D. [phone]  Fransico Him, M.D. Fax: [place], MD Fax: [phone]  [place], DO Fax: [phone]  Electronically Signed by Vernell Leep MD on 03/12/2011 11:16:23 PM

## 2011-03-13 NOTE — Telephone Encounter (Signed)
Patient needs a letter stating that she can return to work but with limitations on sitting for long periods of time. She would like to work on half days at her job and the other half she can work from home. She said that her surgeon is expecting her to be totally healed in June. I will fax it to her at [phone] or [phone]

## 2011-03-14 ENCOUNTER — Other Ambulatory Visit: Payer: Self-pay | Admitting: Infectious Disease

## 2011-03-14 ENCOUNTER — Telehealth: Payer: Self-pay | Admitting: *Deleted

## 2011-03-14 DIAGNOSIS — N19 Unspecified kidney failure: Secondary | ICD-10-CM

## 2011-03-14 NOTE — Telephone Encounter (Signed)
Radiology from [facility] called to ask for an order. States the pt c/o port not flushing. They want order to states evaluate & treat as needed. This may include placement of a new one

## 2011-03-17 ENCOUNTER — Ambulatory Visit (HOSPITAL_COMMUNITY)
Admission: RE | Admit: 2011-03-17 | Discharge: 2011-03-17 | Disposition: A | Discharge status: Home / Self Care | Payer: BC Managed Care – PPO | Source: Ambulatory Visit | Attending: Infectious Disease | Admitting: Infectious Disease

## 2011-03-17 ENCOUNTER — Other Ambulatory Visit (HOSPITAL_COMMUNITY): Payer: BC Managed Care – PPO

## 2011-03-17 DIAGNOSIS — N19 Unspecified kidney failure: Secondary | ICD-10-CM

## 2011-03-17 DIAGNOSIS — Y849 Medical procedure, unspecified as the cause of abnormal reaction of the patient, or of later complication, without mention of misadventure at the time of the procedure: Secondary | ICD-10-CM | POA: Insufficient documentation

## 2011-03-17 DIAGNOSIS — T82598A Other mechanical complication of other cardiac and vascular devices and implants, initial encounter: Secondary | ICD-10-CM | POA: Insufficient documentation

## 2011-03-17 DIAGNOSIS — Z452 Encounter for adjustment and management of vascular access device: Secondary | ICD-10-CM | POA: Insufficient documentation

## 2011-03-17 MED ORDER — IOHEXOL 300 MG/ML  SOLN
50.0000 mL | Freq: Once | INTRAMUSCULAR | Status: AC | PRN
  Administered 2011-03-17: 12 mL via INTRAVENOUS

## 2011-03-21 ENCOUNTER — Encounter: Payer: Self-pay | Admitting: Licensed Clinical Social Worker

## 2011-04-04 ENCOUNTER — Other Ambulatory Visit (HOSPITAL_COMMUNITY): Payer: Self-pay | Admitting: Interventional Radiology

## 2011-04-04 ENCOUNTER — Ambulatory Visit (HOSPITAL_COMMUNITY)
Admission: RE | Admit: 2011-04-04 | Discharge: 2011-04-04 | Disposition: A | Discharge status: Home / Self Care | Payer: BC Managed Care – PPO | Source: Ambulatory Visit | Attending: Interventional Radiology | Admitting: Interventional Radiology

## 2011-04-04 DIAGNOSIS — E875 Hyperkalemia: Secondary | ICD-10-CM

## 2011-04-04 DIAGNOSIS — Z452 Encounter for adjustment and management of vascular access device: Secondary | ICD-10-CM | POA: Insufficient documentation

## 2011-04-11 ENCOUNTER — Other Ambulatory Visit (HOSPITAL_COMMUNITY): Payer: Self-pay | Admitting: Interventional Radiology

## 2011-04-11 ENCOUNTER — Ambulatory Visit (HOSPITAL_COMMUNITY)
Admission: RE | Admit: 2011-04-11 | Discharge: 2011-04-11 | Disposition: A | Discharge status: Home / Self Care | Payer: BC Managed Care – PPO | Source: Ambulatory Visit | Attending: Interventional Radiology | Admitting: Interventional Radiology

## 2011-04-11 DIAGNOSIS — D649 Anemia, unspecified: Secondary | ICD-10-CM

## 2011-04-11 NOTE — Discharge Summary (Signed)
[name], CARSEY                ACCOUNT NO.:  [id number]   MEDICAL RECORD NO.:  [id number]          PATIENT TYPE:  INP   LOCATION:  [id number]                         FACILITY:  [facility]   PHYSICIAN:  Sheila Oats, M.D.DATE OF BIRTH:  [birth year]   DATE OF ADMISSION:  07/11/2007  DATE OF DISCHARGE:  07/15/2007                               DISCHARGE SUMMARY   DISCHARGE DIAGNOSES:  1. Malignant/uncontrolled hypertension.  2. Congestive heart failure exacerbation -- last ejection fraction per      2-D echocardiogram done November 2007 40% (per PCP report).      Exacerbation likely precipitated by #1, and also untreated      hypothyroidism secondary to patient's noncompliance.  3. Type 2 diabetes mellitus.  4. History of multinodular goiter.  5. Hypercholesterolemia.  6. Morbid obesity.  7. History of iron deficiency anemia.   PROCEDURES/STUDIES:  1. CT angiogram of the chest -- No definite pulmonary embolus; studies      reveal limited by the patient body habitus.  VQ scan recommended.  2. VQ scan -- Negative for pulmonary embolus.   BRIEF HISTORY:  The patient is a pleasant, morbidly obese black female;  with the above-listed medical problems, who presented with complaints of  worsening shortness of breath.  She reported that the dyspnea was on  exertion, and had been worsening in the 2 weeks prior to her  presentation.  She also reported worsening of her leg edema, along with  a cough productive of clear sputum.  She was initially seen in her  primary care physician's office, and her O2 saturations noted to be 85-  91%.  She was directly admitted for further evaluation and management.  The patient admitted that she had been out of her blood pressure  medications -- Cardia, Accupril and Cardura; as well as her Synthroid.   Please see the full admission history and physical of July 11, 2007,  for full details of the admission physical examination.   The present findings were  her blood pressure on admission 180/101, and  on her lung examination with decreased breath sounds at the bases; no  crackles and no wheezes.  On her abdomen she had lower abdominal wall  pitting edema.  Also on her extremities she had plus 1-2 lower extremity  edema.  No cyanosis.   LABORATORY DATA:  The chest x-ray showed cardiomegaly and pulmonary  vascular congestion, as well as findings compatible with mild CHF.  Her  EKG:  Normal sinus rhythm, with no ischemic changes.  Her white cell  count 14.9, with a hemoglobin of 11.8, hematocrit 34.9, platelet count  319.  She had a D-dimer which was 1.11, and her brain naturetic peptide  was 381.  Her cardiac enzymes were negative x3, and her LFTs  unremarkable .  Sodium 141, potassium 3.5, chloride 106, CO2 29, BUN 10,  creatinine 0.78.   HOSPITAL COURSE:  PROBLEM #1 - CONGESTIVE HEART FAILURE EXACERBATION --  It was noted that the patient's last EF, as above, was 40% on her last  echocardiogram.  She was started on IV Lasix  upon admission.  Had a  brain naturetic peptide done, and the results are as stated above --  381.  The patient's blood pressures were  markedly elevated, as above. The patient admitted due to being  noncompliant with her blood pressure medications, as she had ran out.  She had serial cardiac enzymes done, and these were negative for  myocardial infarction.  She was restarted on her blood pressure  medications.      Sheila Oats, M.D.  Electronically Signed     ACV/MEDQ  D:  07/15/2007  T:  07/15/2007  Job:  [id number]

## 2011-04-11 NOTE — H&P (Signed)
[name], SCHNACKENBERG                ACCOUNT NO.:  [id number]   MEDICAL RECORD NO.:  [id number]          PATIENT TYPE:  OBV   LOCATION:  [place]                         FACILITY:  [facility]   PHYSICIAN:  Corinna L. Conley Canal, MDDATE OF BIRTH:  Jul 27, 1961   DATE OF ADMISSION:  05/15/2008  DATE OF DISCHARGE:                              HISTORY & PHYSICAL   CHIEF COMPLAINT:  Low blood count.   HISTORY OF PRESENT ILLNESS:  [name] is a pleasant 50 year old black  female with multiple medical problems, including congestive heart  failure, who was sent to the emergency room by her primary care  physician with anemia.  Apparently, she was feeling more short of breath  and gaining fluid weight.  Dr. Radford Pax increased her Lasix, then checked  some baseline labs.  Her hemoglobin was noted to be around 8, and she  was subsequently referred to her primary care physician.  Apparently,  her hemoglobin was in the 7 range, although I do not have the details,  so she was sent to the emergency room.  The patient reports that she is  still menstruating and uses just a few pads per day while on her period.  She, however, has severe hidradenitis suppurative and has chronically  draining blood-tinged pus from the groin.  She also notes that sometimes  there are blood clots in the commode.  She assumed this was from her  hidradenitis. has never had a blood transfusion.  She has been on iron,  but has not been anemic to this degree.  She also has had a cough  productive of yellowish sputum.  Her dyspnea on exertion is worse.  She  is morbidly obese and has chronic orthopnea.  I do not have any records  about what her ejection fraction is.  She is not on aspirin or any other  blood thinners.  She reports compliance with her iron, but only takes  about 50 mg a day.  Apparently, she was to be referred to a  gastroenterologist by her [place] dermatologist to evaluate for Crohn's  disease due to the severity of her  hidradenitis (I assume).  She has not  yet need in weight man with gastroenterology.  She has had no chest  pains.  No palpitations.  She reports compliance with her medications.   PAST MEDICAL HISTORY:  1. Congestive heart failure, see above.  2. Hypertension.  3. Diabetes.  4. Hypothyroidism.  5. Morbid obesity.  6. Hidradenitis suppurativa.  7. Apparently a chronically elevated white blood cell count, for which      she has an appointment with the hematologist next month.   MEDICATIONS:  1. She was recently started on doxycycline 100 mg twice a day by her      dermatologist.  2. Her Lasix was recently increased to 60 mg twice a day.  3. Potassium was increased to 40 mEq twice a day.  4. She is on Cartia XT 300 mg a day.  5. Synthroid 50 mcg a day.  6. Glucophage 1000 mg a day.  7. Pravachol 40 mg a day.  8. Insulin 30 units subcutaneously twice a day, she does not know what      type of insulin.  9. Iron, as above.  10.Doxazosin 8 mg a day.  11.Quinapril 40 mg a day.  12.Spironolactone 50 mg a day.   SOCIAL HISTORY:  She works at [employer] as a Research scientist (physical sciences).  She is  married.  She does not have kids.  She does not drink, smoke, or use  drugs.   FAMILY HISTORY:  Negative for cancer.  Her mother had a coronary artery  bypass graft in her 35s.   REVIEW OF SYSTEMS:  As above, otherwise negative.   PHYSICAL EXAMINATION:  VITAL SIGNS:  Her temperature is 98.7, blood  pressure 163/74, pulse 107, respiratory rate 18, oxygen saturation 91%.  GENERAL:  The patient is an obese black female, in no acute distress.  HEENT: Normocephalic, atraumatic.  Pupils equal, round, and reactive to  light.  Sclerae nonicteric.  NECK:  She has a thick neck.  LUNGS:  Clear to auscultation bilaterally, without wheezes, rhonchi, or  rales.  CARDIOVASCULAR:  Regular rate and rhythm, without murmurs, gallops, or  rubs.  ABDOMEN:  Obese.  She refuses at this time to take off her skirt, as she  is  not in a private area.  RECTAL:  She also has refused rectal exam for the same reason.  EXTREMITIES:  She has trace edema.  Pulses are intact.  NEUROLOGIC:  She is alert and oriented.  Cranial nerves and sensorimotor  exam are intact.  PSYCHIATRIC:  Normal affect.  SKIN:  She does have some pustules in both axillae and one of the folds  of her back on the right.  Again, she refuses examination of the groin  or buttocks area at this time but reports that she will consent once she  is up in a private room.   LABORATORIES:  Her white blood cell count is 14.8, hemoglobin 7.6,  hematocrit 24, MCV 73, RDW 19, platelet count 507.  Basic metabolic  panel significant for a glucose of 216, otherwise unremarkable.   ASSESSMENT/PLAN:  1. Microcytic anemia.  I assume this is iron deficiency.  She will      need a rectal exam.  Certainly, this could be from chronic bloody      drainage from her hidradenitis suppurativa.  She will get a      transfusion of 2 units of packed red blood cells.  She will be      placed on observation and may need further workup if her hemoglobin      continues to drop.  However, I suspect she can continue the workup      as an outpatient.  She has worsening of her congestive heart      failure symptoms, and I believe this is the results of the anemia.  2. Congestive heart failure exacerbation secondary to above.  3. Hidradenitis suppurativa.  4. Diabetes.  I will check a hemoglobin A1c.  5. Hypothyroidism.  I will check a TSH.  6. Morbid obesity.  7. Cough.  She has a negative chest x-ray from May 13, 2008, other      than cardiac enlargement and pulmonary venous congestion.  There is      no infiltrate.  This will be monitored for now.      Corinna L. Conley Canal, MD  Electronically Signed     CLS/MEDQ  D:  05/16/2008  T:  05/16/2008  Job:  [id number]  cc:   Emeline General. Dema Severin, M.D.  [phone]   Fransico Him, M.D.  Fax: [phone]   Lear Ng,  MD  Fax: [phone]

## 2011-04-11 NOTE — Discharge Summary (Signed)
[name], [name]                ACCOUNT NO.:  [id number]   MEDICAL RECORD NO.:  [id number]          PATIENT TYPE:  INP   LOCATION:  [id number]                         FACILITY:  [facility]   PHYSICIAN:  Sheila Oats, M.D.DATE OF BIRTH:  [birth year]   DATE OF ADMISSION:  07/11/2007  DATE OF DISCHARGE:  07/15/2007                         DISCHARGE SUMMARY - REFERRING   HOSPITAL COURSE:  1. CONGESTIVE HEART FAILURE EXACERBATION:  The patient was restarted      on her anti-hypertensives and serial cardiac enzymes were done to      rule out  a myocardial infarction and these were negative. A D-      dimer was obtained and this was elevated at 1.1 and so a CT      angiogram was ordered and it was read to be negative but also      radiology indicated that it was an inadequate study and recommended      a VQ scan. The VQ scan was done and this was negative for PE. The      patient's TSH was also done and it was noted to be elevated at      5.38. As noted above, the patient had been on Synthroid for      hypothyroidism but was non-compliant with it. The impression was      that the patient's CHF exacerbation was secondary to the      uncontrolled hypertension as well as under-treated hypothyroidism,      secondary to her non-compliance. The patient was diuresed with IV      Lasix and maintained on her Accupril as well as her other      antihypertensives for better blood pressure control. Her last blood      pressure prior to discharge today is 126/66. Her edema is      significantly improved. Her dyspnea is improved as well as she was      oxygenating well on room air and ambulating. A 2-D echocardiogram      was not done at this time, as she had had one documented as above,      November 2007 and the impression was that this exacerbation was      secondary to non-compliance with her medications as already stated.      The patient is to follow up with her primary care physician, Dr.      Dema Severin  and also her cardiologist, Dr. Radford Pax, for further      management. Her Lasix has been increased to 60 mg in the a.m. and      40 in the p.m. and she is to have a B-met done upon followup with      her primary care physician later this week, to monitor her BUN and      creatinine as well as her potassium.  2. HYPERTENSION:  As above. Her outpatient medications were resumed      and her blood pressures are better controlled. The importance of      taking her medications was reiterated.  3. DIABETES MELLITUS:  Her Accu-checks were  monitored and she was      maintained on her outpatient medication except for the Metformin.      Her CHF is significantly improved at this time and she is to resume      all her outpatient medications upon discharge. Her hemoglobin A1C      in the hospital was 9.1. She is to followup with her primary care      physician as well as Dr. Bubba Camp.  4. HISTORY OF HYPERCHOLESTEROLEMIA:  She was maintained on Pravachol      in the hospital and she is to continue this upon discharge.  5. HYPOTHYROIDISM:  As noted above, her TSH was elevated. Her      Synthroid was resumed and she is to continue this and have her TSH      rechecked per her primary care physician or endocrinologist Dr.      Bubba Camp upon followup. Her Synthroid dose was not increased, as it      was noted that this was secondary to non-compliance.   DISCHARGE MEDICATIONS:  1. Lasix 60 mg p.o. q. a.m. and 40 mg p.o. q. p.m.  2. Quinapril 40 mg daily.  3. KCL 20 meq p.o. b.i.d.  4. The patient to continue Cardia XL, Cardura, Glipizide, Metformin,      Pravastatin, Januvia, and Serax 150 mg as previously.   FOLLOWUP:  1. Dr Harlan Stains in 3 to 4 days. The patient to call for an      appointment and she is to have a B-met checked to manage her BUN      and creatinine as well as potassium.  2. Dr. Radford Pax is 1 to 2 weeks.  3. Dr. Bubba Camp as scheduled.   DIET:  A 2 gram sodium, modified carbohydrates.    CONDITION ON DISCHARGE:  Improved, stable.      Sheila Oats, M.D.  Electronically Signed     ACV/MEDQ  D:  07/15/2007  T:  07/15/2007  Job:  [id number]   cc:   Emeline General. Dema Severin, M.D.  Kathrin Penner, M.D.  Fransico Him, M.D.

## 2011-04-11 NOTE — H&P (Signed)
NAMEGRACLYN, [name]                ACCOUNT NO.:  [id number]   MEDICAL RECORD NO.:  [id number]          PATIENT TYPE:  INP   LOCATION:  4736                         FACILITY:  [facility]   PHYSICIAN:  Sheila Oats, M.D.DATE OF BIRTH:  1961/07/10   DATE OF ADMISSION:  07/11/2007  DATE OF DISCHARGE:                              HISTORY & PHYSICAL   CHIEF COMPLAINT:  Worsening dyspnea.   HISTORY OF PRESENT ILLNESS:  The patient is a morbidly obese 50 year old  black female with other past medical history significant for CHF with  her last echocardiogram in November of 2007, as per her primary care  physician, revealing mild to moderate LV dysfunction with an ejection  fraction of 40%, multinodular goiter, hypothyroidism, iron deficiency  anemia, hypercholesterolemia, who presents with the above complaints.  She states the dyspnea is with exertion and has worsen in the past one  to two weeks.  She also has had leg swelling and noted a lower abdominal  wall swelling as well.  She admits to a cough productive of clear phlegm  times one month.  She also admits to a vague chest pain on the day of  presentation that just lasted a few seconds and she is unable to further  characterize, and orthopnea.  She denies fevers, abdominal pain,  dysuria, diarrhea, melena and no hematochezia.  She was seen at her  primary care physician's office and it was noted that her O2 SATS were  ranging from 85 to 91% and is directly admitted for further evaluation  and management.  The patient admits that she has been non-compliant with  some of her medications because she ran out, but she states that she  has had her Lasix and has been taking it daily.  Per her primary care  physician, the patient also was referred for a sleep study, but she did  not keep that appointment.   PAST MEDICAL HISTORY:  As above.   MEDICATIONS:  1. Glucotrol 10 mg b.i.d.  2. Cardura 8 mg q-h.s.  3. Glucophage 1000 mg.  4. Lasix  20 mg two tablets q-a.m.  5. Pravachol 40 mg daily.  6. Klor-Con 20 mEq daily.  7. Januvia 100 mg daily.  8. Ferrex 150 mg.  9. The patient also indicates that she is suppose on Cartia-XL 300 mg      daily, Accupril 40 mg p.o. daily and Synthroid 50 mg daily, which      she has been taking because she has been out of this.   SOCIAL HISTORY:  She states that she quit tobacco 22 years ago.  She  also states that she has no alcohol in two years.   FAMILY HISTORY:  Her father had coronary artery disease/CABG in his 29s.  Both of her grandmother's had MI and her mother and grandmother had  diabetes and her grandmother and father hypertension.   REVIEW OF SYSTEMS:  As per HPI.  Other review of systems negative.   PHYSICAL EXAMINATION:  GENERAL:  The patient is an obese black female,  pleasant, in no respiratory distress.  VITAL  SIGNS:  Her blood pressure is 180/101, pulse 86, respiratory rate  22, temperature 97.9 and O2 SAT is 90% on room air.  HEENT:  PERRL, EOMI, sclera anicteric, moist mucous membranes, no oral  exudates.  NECK:  Supple, no thyromegaly, no JVD appreciated.  LUNGS:  Distant breath sounds at the bases, no crackles and no wheezes.  CARDIOVASCULAR:  Regular rate and rhythm.  Normal S1, S2, no S3  appreciated.  ABDOMEN:  Soft, obese, bowel sounds present, nontender and nondistended.  No masses palpable.  She has pitting edema in her lower abdominal wall,  nontender, no erythema.  EXTREMITIES:  She has +1 to 2 lower extremity edema, no cyanosis.  NEURO:  She alert and oriented x3, cranial nerves II-XII grossly intact,  nonfocal exam.   LABORATORY DATA:  Labs ordered, pending.   ASSESSMENT/PLAN:  1. Dyspnea, this is likely multifactorial in a patient with history of      congestive heart failure and morbid obesity.  Also noted is that      the patient previously referred for a sleep study which she did not      keep the appointment.  Chest x-ray, brain-natriuretic  peptide, D-      dimer ordered and we will further evaluate with CT angiogram if D-      dimer elevated.  We will obtain serial cardiac enzymes and follow      and consult cardiology pending the above studies.  Per primary care      physician, the patient's last 2-D echocardiogram showed an ejection      fraction of 40%.  We will diurese patient.  2. Uncontrolled/malignant hypertension.  As above, blood pressure      180/101 initially and this is likely secondary to non-compliance as      the patient has not been her Cartia and Accupril.  Resume      antihypertensives and further manage as appropriate.  3. Probable congestive heart failure exacerbation, number two likely      contributing factor, we will obtain serial cardiac enzymes, control      blood pressure.  Obtain records of her last 2-D echocardiogram if      there is one any more recent than the 2005 echocardiogram with her      records from the primary care physician.  We will also resume ACE-      inhibitor and diurese as above.  4. Diabetes mellitus, monitor Accu-Chek on out-patient medication      except for metformin, hold for now, sliding scale coverage.  5. Multinodular goiter/hypothyroidism.  We will recheck a TSH level      and continue Synthroid.  6. History of hypercholesterolemia, continue Pravachol.  7. History of iron deficiency anemia, obtain hemoglobin and hematocrit      and follow.      Sheila Oats, M.D.  Electronically Signed     ACV/MEDQ  D:  07/12/2007  T:  07/12/2007  Job:  [id number]   cc:   [facility]. Dema Severin, M.D.  Fransico Him, M.D.

## 2011-04-12 ENCOUNTER — Other Ambulatory Visit: Payer: Self-pay | Admitting: Infectious Disease

## 2011-04-12 DIAGNOSIS — N19 Unspecified kidney failure: Secondary | ICD-10-CM

## 2011-04-14 ENCOUNTER — Encounter: Payer: Self-pay | Admitting: Infectious Disease

## 2011-04-26 ENCOUNTER — Encounter: Payer: Self-pay | Admitting: Infectious Disease

## 2011-05-15 ENCOUNTER — Telehealth: Payer: Self-pay | Admitting: *Deleted

## 2011-05-15 NOTE — Telephone Encounter (Signed)
Patient called to get an appointment advised she is infected again. She said that the only appointment she was given was July 2 and she does not want to wait that long. Offered he an appointment with Dr Megan Salon for 6-20 at 3:15 and patient said she really wanted to see Dr Tommy Medal and would call us back if she changed her mind.

## 2011-05-17 ENCOUNTER — Telehealth: Payer: Self-pay | Admitting: Licensed Clinical Social Worker

## 2011-05-17 NOTE — Telephone Encounter (Signed)
Patient has new lesions with pain, bleeding and swelling. Wants to see Dr. Tommy Medal but the only available appointment is for 05/29/2011. Can we overbook her or bring her in sooner.

## 2011-05-17 NOTE — Telephone Encounter (Signed)
We can try to see about overbook next week I will not be in [facility] on TH or FR. I dont know if one of the ID "clinic MDs" can see her in the meantime

## 2011-05-22 ENCOUNTER — Encounter: Payer: Self-pay | Admitting: Infectious Disease

## 2011-05-22 ENCOUNTER — Ambulatory Visit (INDEPENDENT_AMBULATORY_CARE_PROVIDER_SITE_OTHER): Payer: BC Managed Care – PPO | Admitting: Infectious Disease

## 2011-05-22 VITALS — BP 136/71 | HR 84 | Temp 99.0°F | Wt 320.2 lb

## 2011-05-22 DIAGNOSIS — R112 Nausea with vomiting, unspecified: Secondary | ICD-10-CM

## 2011-05-22 DIAGNOSIS — I509 Heart failure, unspecified: Secondary | ICD-10-CM

## 2011-05-22 DIAGNOSIS — L732 Hidradenitis suppurativa: Secondary | ICD-10-CM

## 2011-05-22 LAB — COMPREHENSIVE METABOLIC PANEL
AST: 7 U/L (ref 0–37)
Alkaline Phosphatase: 129 U/L — ABNORMAL HIGH (ref 39–117)
BUN: 51 mg/dL — ABNORMAL HIGH (ref 6–23)
Creat: 1.67 mg/dL — ABNORMAL HIGH (ref 0.50–1.10)
Potassium: 5 mEq/L (ref 3.5–5.3)

## 2011-05-22 LAB — CBC WITH DIFFERENTIAL/PLATELET
Basophils Absolute: 0 10*3/uL (ref 0.0–0.1)
Basophils Relative: 0 % (ref 0–1)
Eosinophils Relative: 2 % (ref 0–5)
HCT: 21.9 % — ABNORMAL LOW (ref 36.0–46.0)
MCHC: 28.8 g/dL — ABNORMAL LOW (ref 30.0–36.0)
Monocytes Absolute: 0.9 10*3/uL (ref 0.1–1.0)
Neutro Abs: 16.8 10*3/uL — ABNORMAL HIGH (ref 1.7–7.7)
Platelets: 455 10*3/uL — ABNORMAL HIGH (ref 150–400)
RDW: 17.9 % — ABNORMAL HIGH (ref 11.5–15.5)

## 2011-05-22 MED ORDER — PROMETHAZINE HCL 25 MG PO TABS: 25.0000 mg | ORAL_TABLET | Freq: Four times a day (QID) | ORAL | Status: AC | PRN

## 2011-05-22 MED ORDER — ONDANSETRON HCL 4 MG PO TABS: 4.0000 mg | ORAL_TABLET | Freq: Four times a day (QID) | ORAL | Status: AC | PRN

## 2011-05-22 MED ORDER — PROMETHAZINE HCL 25 MG PO TABS: 25.0000 mg | ORAL_TABLET | Freq: Four times a day (QID) | ORAL | Status: DC | PRN

## 2011-05-22 NOTE — Assessment & Plan Note (Signed)
Component of weight gain could be volume. WIll defer to PCP re management of this.

## 2011-05-22 NOTE — Assessment & Plan Note (Signed)
I took cutlures from axilla, perineum and perirectal area. I will place her on tygacil with excellent MRSA, MSSA, STREP, ANANEROBIC, GNR (except for Pseudomonas) and treat her for 42 days. I wouldlike her to followup with surgery as well

## 2011-05-22 NOTE — Progress Notes (Signed)
  Subjective:    Patient ID: [name], female    DOB: 12-02-1960, 50 y.o.   MRN: [id number]  HPI  50 year old [place] American lady with severe Hidradenitis supurativa, who has undergo I and D's by [facility] during hospitalization this year, in whom I had portacath placed to administer IV antibiotics, has had flare of HS once again one month after stopping IV merrem and vancomycin. She has had increasing drainage from her perineal area and her buttocks in particular as well as under her right axilla. She has also experienced weight gain since last visit. She has been seen by [facility] but this was while she was still on IV antibiotics. She has had low grade temperatures as well as malaise. We spent greater than 50% of time in face to face counselling of the patient and in coordiantion of her care.  Review of Systems    see HPI otherwise negative 12 point review Objective:   Physical Exam  Constitutional: She is oriented to person, place, and time. No distress.  HENT:  Head: Normocephalic and atraumatic.  Mouth/Throat: No oropharyngeal exudate.  Eyes: EOM are normal. Pupils are equal, round, and reactive to light. No scleral icterus.  Neck: No JVD present.  Cardiovascular: Normal rate, regular rhythm and normal heart sounds.   Pulmonary/Chest: Effort normal and breath sounds normal. No respiratory distress.  Abdominal: Soft. Bowel sounds are normal.  Musculoskeletal: Normal range of motion. She exhibits edema and tenderness.  Lymphadenopathy:    She has no cervical adenopathy.  Neurological: She is alert and oriented to person, place, and time.  Skin: She is not diaphoretic.          She has clear dranage from right axilla, no active lesions uder the breasts. Her perineum has bloody and purulent drainage from areas near where she had I and D and she has similar drainage around her rectum. Malodorous throughout  Psychiatric: She has a normal mood and affect. Her behavior is normal.           Assessment & Plan:

## 2011-05-22 NOTE — Assessment & Plan Note (Signed)
tygacil may cause nausea and we will therefore premedicate her doses of tygacil with zofran and write for prn zofran and if needed phenergan

## 2011-05-23 ENCOUNTER — Emergency Department (HOSPITAL_COMMUNITY)
Admission: EM | Admit: 2011-05-23 | Discharge: 2011-05-23 | Disposition: A | Discharge status: Home / Self Care | Payer: BC Managed Care – PPO | Attending: Emergency Medicine | Admitting: Emergency Medicine

## 2011-05-23 ENCOUNTER — Telehealth: Payer: Self-pay | Admitting: Licensed Clinical Social Worker

## 2011-05-23 DIAGNOSIS — E039 Hypothyroidism, unspecified: Secondary | ICD-10-CM | POA: Insufficient documentation

## 2011-05-23 DIAGNOSIS — D649 Anemia, unspecified: Secondary | ICD-10-CM | POA: Insufficient documentation

## 2011-05-23 DIAGNOSIS — I10 Essential (primary) hypertension: Secondary | ICD-10-CM | POA: Insufficient documentation

## 2011-05-23 LAB — BASIC METABOLIC PANEL
CO2: 21 mEq/L (ref 19–32)
Chloride: 105 mEq/L (ref 96–112)
GFR calc non Af Amer: 34 mL/min — ABNORMAL LOW (ref >60)
Glucose, Bld: 111 mg/dL — ABNORMAL HIGH (ref 70–99)
Potassium: 4.6 mEq/L (ref 3.5–5.1)
Sodium: 134 mEq/L — ABNORMAL LOW (ref 135–145)

## 2011-05-23 LAB — DIFFERENTIAL
Lymphocytes Relative: 6 % — ABNORMAL LOW (ref 12–46)
Lymphs Abs: 1.2 10*3/uL (ref 0.7–4.0)
Monocytes Absolute: 1.2 10*3/uL — ABNORMAL HIGH (ref 0.1–1.0)
Monocytes Relative: 6 % (ref 3–12)
Neutro Abs: 16.9 10*3/uL — ABNORMAL HIGH (ref 1.7–7.7)

## 2011-05-23 LAB — CBC
Hemoglobin: 6 g/dL — CL (ref 12.0–15.0)
RBC: 2.69 MIL/uL — ABNORMAL LOW (ref 3.87–5.11)
WBC: 19.8 10*3/uL — ABNORMAL HIGH (ref 4.0–10.5)

## 2011-05-23 NOTE — Telephone Encounter (Signed)
Dr. Orene Desanctis was contacted at 12 midnight by [facility] stating that this patient has a hemoglobin of 6. I contacted the patient to inform her and she stated that she will be going to [place] for a blood transfusion today. Patient also has a porta cath that needs accessed because she will be starting IV antibiotic therapy, I explained to the patient that I was unsure how long she will need to wait after her blood transfusion before she can start IV antibiotics. I told her to ask the physician at the ER when they order her blood transfusion. Patient is aware and going to seek treatment today.

## 2011-05-24 LAB — CROSSMATCH
Antibody Screen: NEGATIVE
Unit division: 0

## 2011-05-26 ENCOUNTER — Other Ambulatory Visit: Payer: Self-pay | Admitting: Infectious Disease

## 2011-05-26 ENCOUNTER — Telehealth: Payer: Self-pay | Admitting: Infectious Disease

## 2011-05-26 LAB — WOUND CULTURE: Gram Stain: NONE SEEN

## 2011-05-26 NOTE — Telephone Encounter (Signed)
Noted culture results with pseudomonas, e coli, staph aureus and enterococcus from culture. If we are going to try to cover ALL of these organisms she would need to be changed from her tygacil to a different regimen to cover the pseudomonas. I would propose meropenem 2g iv q 12 hours. I need to know who is giving her antibiotics. I believe it is advanced home care. I also would like to see how she is doing which could help [place] interpret meaning of these cultures. I have left voice mail on her hom and cell phones

## 2011-05-27 LAB — WOUND CULTURE

## 2011-05-29 ENCOUNTER — Ambulatory Visit: Payer: BC Managed Care – PPO | Admitting: Infectious Disease

## 2011-05-29 ENCOUNTER — Telehealth: Payer: Self-pay | Admitting: Infectious Disease

## 2011-05-29 NOTE — Telephone Encounter (Signed)
She feels better on tygacil. Will touch base again in a week and if not improving sufficientyly change her to merrem vs cefepime and flagyl

## 2011-06-05 ENCOUNTER — Encounter: Payer: Self-pay | Admitting: Infectious Disease

## 2011-06-07 ENCOUNTER — Telehealth: Payer: Self-pay | Admitting: *Deleted

## 2011-06-07 NOTE — Telephone Encounter (Signed)
Brooke, an Therapist, sports from [facility] called to report problems getting to see this pt to get labs drawn. States she has not been home 2 times that she tried to see her. She is working & gets off & is usually home by 2pm. rn has gone at late as 6pm & still not home. Does not respond to calls, except for 1 when she left the rn a voice mail stating she "did not feel that she needed a visit" she is going to hospital to get the port a cath accessed as they have had difficulty getting it in the home.   She is independent in wound care & IV. She has been on Tigacil for "1-2 weeks"  RN wants to know if they should d/c her & she can get her labs done when she is getting her porta cath accessed or here in our lab. To md plz call Brooke with orders [phone].

## 2011-06-07 NOTE — Telephone Encounter (Signed)
RN will try to get blood work tomorrow. They are on the verge of dc her from their service. She will then run out of antibiotics and not be able to obtain antibiotics from [facility]. She may need to then be plugged in with short stay for administration of antibiotics.

## 2011-06-13 ENCOUNTER — Encounter (INDEPENDENT_AMBULATORY_CARE_PROVIDER_SITE_OTHER): Payer: Self-pay | Admitting: Surgery

## 2011-06-19 ENCOUNTER — Encounter: Payer: Self-pay | Admitting: Infectious Disease

## 2011-06-22 NOTE — Telephone Encounter (Signed)
TELEPHONE NOTE

## 2011-07-03 ENCOUNTER — Encounter: Payer: Self-pay | Admitting: Infectious Disease

## 2011-07-07 ENCOUNTER — Telehealth: Payer: Self-pay | Admitting: Licensed Clinical Social Worker

## 2011-07-07 NOTE — Telephone Encounter (Signed)
Patient called stating that this is her last of tygacil iv antibiotics, she called around 4:30 today stating that her infection is really bad and wants Dr. Tommy Medal to order more IV antibiotics. Spoke with Dr. Tommy Medal and he stated she will need to go to the ER to be evaluated. Patient notified.

## 2011-07-26 ENCOUNTER — Ambulatory Visit (INDEPENDENT_AMBULATORY_CARE_PROVIDER_SITE_OTHER): Payer: BC Managed Care – PPO | Admitting: Infectious Disease

## 2011-07-26 ENCOUNTER — Other Ambulatory Visit: Payer: Self-pay | Admitting: Infectious Disease

## 2011-07-26 ENCOUNTER — Encounter: Payer: Self-pay | Admitting: Infectious Disease

## 2011-07-26 VITALS — BP 144/84 | HR 99 | Temp 99.4°F | Wt 340.0 lb

## 2011-07-26 DIAGNOSIS — I509 Heart failure, unspecified: Secondary | ICD-10-CM

## 2011-07-26 DIAGNOSIS — L732 Hidradenitis suppurativa: Secondary | ICD-10-CM

## 2011-07-26 LAB — COMPREHENSIVE METABOLIC PANEL
ALT: <8 U/L (ref 0–35)
AST: 7 U/L (ref 0–37)
CO2: 17 mEq/L — ABNORMAL LOW (ref 19–32)
Calcium: 8.4 mg/dL (ref 8.4–10.5)
Chloride: 111 mEq/L (ref 96–112)
Sodium: 139 mEq/L (ref 135–145)
Total Bilirubin: 0.2 mg/dL — ABNORMAL LOW (ref 0.3–1.2)
Total Protein: 6.9 g/dL (ref 6.0–8.3)

## 2011-07-26 NOTE — Progress Notes (Signed)
Subjective:    Patient ID: [name], female    DOB: 06/01/1961, 50 y.o.   MRN: [id number]  HPI  50 year old [place] American lady with severe Hidradenitis supurativa, morbid obesity, CHF who I have been following for quite some time now. She has had a portacath placed for IV antibiotics which she has received several times over the past half year. 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 She was referred to [place] but apparently was not ready to undergo further more definitive surgeries until her finances were in order. In any case her HS continues to do poorly and she has recurrence of severe disease yet againn shortly after coming off of IV tygacil. She has also gained 70# weight which she believes may be partly due to CHF but also partly due to diet. She has worsening drainage from her buttocks, her perineum, and her left axilla. She has followup appt next Tuesday with [facility].  She was worked into clinic on urgent basis. She does endorse some subjective fevers but not had a measured temperature. She has not had drenching night sweats, nausea or vomiting. I spent greater than 45 minutes counselling Ms Biegler and her mother who accompanied her including greater than 50% of time in face to face counselliing and in coordination of care.  Review of Systems  Constitutional: Positive for activity change and unexpected weight change. Negative for fever, chills, diaphoresis, appetite change and fatigue.  HENT: Negative for congestion, sore throat, rhinorrhea, sneezing, trouble swallowing and sinus pressure.   Eyes: Negative for photophobia and visual disturbance.  Respiratory: Negative for cough, chest tightness, shortness of breath, wheezing and stridor.   Cardiovascular: Negative for chest pain, palpitations and leg swelling.  Gastrointestinal: Positive for abdominal distention. Negative for nausea, vomiting, abdominal pain, diarrhea, constipation, blood in  stool and anal bleeding.  Genitourinary: Positive for flank pain, vaginal bleeding and pelvic pain. Negative for dysuria, hematuria and difficulty urinating.  Musculoskeletal: Positive for back pain. Negative for myalgias, joint swelling, arthralgias and gait problem.  Skin: Positive for color change and wound. Negative for pallor and rash.  Neurological: Negative for dizziness, tremors, weakness and light-headedness.  Hematological: Negative for adenopathy. Does not bruise/bleed easily.  Psychiatric/Behavioral: Negative for behavioral problems, confusion, sleep disturbance, dysphoric mood, decreased concentration and agitation.       Objective:   Physical Exam  Constitutional: She is oriented to person, place, and time. She appears well-developed and well-nourished. No distress.  HENT:  Head: Normocephalic and atraumatic.  Mouth/Throat: Oropharynx is clear and moist. No oropharyngeal exudate.  Eyes: Conjunctivae and EOM are normal. Pupils are equal, round, and reactive to light. No scleral icterus.  Neck: Normal range of motion. Neck supple. No JVD present.  Cardiovascular: Normal rate, regular rhythm and normal heart sounds.  Exam reveals no gallop and no friction rub.   No murmur heard. Pulmonary/Chest: Effort normal and breath sounds normal. No respiratory distress. She has no wheezes. She has no rales.  Abdominal: She exhibits distension. There is tenderness.  Musculoskeletal: She exhibits edema.  Lymphadenopathy:    She has no cervical adenopathy.  Neurological: She is alert and oriented to person, place, and time. She has normal reflexes. She exhibits normal muscle tone. Coordination normal.  Skin: Skin is warm and dry. She is not diaphoretic. There is erythema. No pallor.       She has left flank area of purulence that is new. She  has induration of her skin on her buttocks adn dripping purulent foul smelling material there as well as from her inguinal region bilaterally. ALl of  these areas were swabbed for culture  Psychiatric: She has a normal mood and affect. Her behavior is normal. Judgment and thought content normal.          Assessment & Plan:  HIDRADENITIS SUPPURATIVA I have takend superficial cultures. If she sees [facility] and they perform debridement with deep cultures this could better guide subsquent systemic antibiotics via her portacath. She really needs a lot of help here. She needs to see a Medical sales representative for excision of her sweat glands. A dermatologist from a tertiary care center that specialized in HS might be helpful for various topical strategies adn intralesional steroids in the interim. She needs weight loss and perhaps the bariatric surgery considered at [facility] could be contemplated if not help from a nutrionist and weight loss expert. Her CHF needs to be optomized as well if part of her weight gain is due to that  CHF Will check bmp and cbc today. I do wonder if she has gained weight in part secondary to CHF though she also might have lost excessive weight through overly aggressive diuresis in the past.  MORBID OBESITY Could benefit from appetitie suppressive drugs from someone more familiar than I, a dietician and potentially from bariatric surgery

## 2011-07-26 NOTE — Assessment & Plan Note (Signed)
Could benefit from appetitie suppressive drugs from someone more familiar than I, a dietician and potentially from bariatric surgery

## 2011-07-26 NOTE — Assessment & Plan Note (Signed)
I have takend superficial cultures. If she sees [facility] and they perform debridement with deep cultures this could better guide subsquent systemic antibiotics via her portacath. She really needs a lot of help here. She needs to see a Medical sales representative for excision of her sweat glands. A dermatologist from a tertiary care center that specialized in HS might be helpful for various topical strategies adn intralesional steroids in the interim. She needs weight loss and perhaps the bariatric surgery considered at [facility] could be contemplated if not help from a nutrionist and weight loss expert. Her CHF needs to be optomized as well if part of her weight gain is due to that

## 2011-07-26 NOTE — Assessment & Plan Note (Signed)
Will check bmp and cbc today. I do wonder if she has gained weight in part secondary to CHF though she also might have lost excessive weight through overly aggressive diuresis in the past.

## 2011-07-27 LAB — CBC WITH DIFFERENTIAL/PLATELET
Lymphocytes Relative: 7 % — ABNORMAL LOW (ref 12–46)
Lymphs Abs: 1.3 10*3/uL (ref 0.7–4.0)
Neutrophils Relative %: 86 % — ABNORMAL HIGH (ref 43–77)
Platelets: 562 10*3/uL — ABNORMAL HIGH (ref 150–400)
RBC: 2.89 MIL/uL — ABNORMAL LOW (ref 3.87–5.11)
WBC: 20.1 10*3/uL — ABNORMAL HIGH (ref 4.0–10.5)

## 2011-07-28 ENCOUNTER — Telehealth: Payer: Self-pay | Admitting: Infectious Disease

## 2011-07-28 NOTE — Telephone Encounter (Signed)
Pts hemoglobin is 6.5. i am trying to call her and left voice message on her home and mobile numbers. She needs to come to ED for blood transfusion> Tamika can you call her again when you get back? Thanks

## 2011-07-29 LAB — WOUND CULTURE

## 2011-07-31 LAB — WOUND CULTURE

## 2011-08-03 ENCOUNTER — Ambulatory Visit (INDEPENDENT_AMBULATORY_CARE_PROVIDER_SITE_OTHER): Payer: BC Managed Care – PPO | Admitting: Surgery

## 2011-08-03 ENCOUNTER — Encounter (INDEPENDENT_AMBULATORY_CARE_PROVIDER_SITE_OTHER): Payer: Self-pay | Admitting: Surgery

## 2011-08-03 VITALS — BP 134/80 | HR 72 | Temp 98.6°F | Ht 66.0 in | Wt 340.0 lb

## 2011-08-03 DIAGNOSIS — L732 Hidradenitis suppurativa: Secondary | ICD-10-CM

## 2011-08-03 MED ORDER — SULFAMETHOXAZOLE-TMP DS 800-160 MG PO TABS: 1.0000 | ORAL_TABLET | Freq: Two times a day (BID) | ORAL | Status: AC

## 2011-08-03 NOTE — Progress Notes (Signed)
The patient returns to clinic today. She has history of chronic hidradenitis to her perineum and buttocks. She was last seen her this year after sitting drainage of some of these areas back in March. I recommend referral to a tertiary care center given the extensive nature of her disease and complex wound management issues. This has not happened. She returns today to clinic for a recheck of these wounds. She is currently off antibiotics.  Review of systems: Positive for drainage from chromic wounds and perineum no fever or chills  Physical exam: Skin: Extensive hidradenitis involving the lower pannus perineum and suprapubic area in the medial aspect of both gluteal regions. Multiple draining sinus tracts.  Impression: Chronic hidradenitis of perineum and buttocks.  Plan: This is too extensive to handle. This is a large wound area with multiple medical problems I think needs to be handle by plastic surgeon. This will require significant significant excision and coverage of the large surface area. This is too complex for general surgery. All place her on Bactrim DS one tablet by mouth twice a day for now and [facility].

## 2011-08-03 NOTE — Patient Instructions (Signed)
Refer to [facility] to plastic surgery.  Start Bactrim DS for now for 14 days.

## 2011-08-09 ENCOUNTER — Telehealth: Payer: Self-pay | Admitting: *Deleted

## 2011-08-09 NOTE — Telephone Encounter (Signed)
States md called her & lm that some of her labs were abnormal. She thinks she may need blood. Message to md to clarify what is needed. Pt asked that he call her back

## 2011-08-09 NOTE — Telephone Encounter (Signed)
She definitely needs a blood transfusion and needs to come to ed for this

## 2011-08-10 ENCOUNTER — Telehealth: Payer: Self-pay | Admitting: Licensed Clinical Social Worker

## 2011-08-10 NOTE — Telephone Encounter (Signed)
Patient called stating that she received a message on her voicemail from Dr. Tommy Medal last week stating that her hemoglobin was low and she needed a blood transfusion and she also states that she has 40 to 50 lbs of fluid retention. I will let the patient know to go to the ER per Dr. Derek Mound note.

## 2011-08-15 ENCOUNTER — Telehealth: Payer: Self-pay | Admitting: *Deleted

## 2011-08-15 ENCOUNTER — Encounter (HOSPITAL_COMMUNITY): Payer: BC Managed Care – PPO

## 2011-08-15 NOTE — Telephone Encounter (Signed)
Clarise Cruz, a nurse from [facility] called to report pt will not sit in recliner to get blood. She wants to lay on a bed. Wants to be admitted. A few minutes later Clarise Cruz called back & reported that pt walked out without getting blood. To mc

## 2011-08-16 ENCOUNTER — Inpatient Hospital Stay (HOSPITAL_COMMUNITY)
Admission: EM | Admit: 2011-08-16 | Discharge: 2011-08-23 | DRG: 574 | Disposition: A | Discharge status: Home / Self Care | Payer: BC Managed Care – PPO | Attending: Family Medicine | Admitting: Family Medicine

## 2011-08-16 ENCOUNTER — Emergency Department (HOSPITAL_COMMUNITY): Payer: BC Managed Care – PPO

## 2011-08-16 DIAGNOSIS — E785 Hyperlipidemia, unspecified: Secondary | ICD-10-CM | POA: Diagnosis present

## 2011-08-16 DIAGNOSIS — E039 Hypothyroidism, unspecified: Secondary | ICD-10-CM | POA: Diagnosis present

## 2011-08-16 DIAGNOSIS — N179 Acute kidney failure, unspecified: Secondary | ICD-10-CM | POA: Diagnosis present

## 2011-08-16 DIAGNOSIS — R609 Edema, unspecified: Secondary | ICD-10-CM | POA: Diagnosis present

## 2011-08-16 DIAGNOSIS — E119 Type 2 diabetes mellitus without complications: Secondary | ICD-10-CM | POA: Diagnosis present

## 2011-08-16 DIAGNOSIS — L732 Hidradenitis suppurativa: Secondary | ICD-10-CM | POA: Diagnosis present

## 2011-08-16 DIAGNOSIS — I428 Other cardiomyopathies: Secondary | ICD-10-CM | POA: Diagnosis present

## 2011-08-16 DIAGNOSIS — D500 Iron deficiency anemia secondary to blood loss (chronic): Principal | ICD-10-CM | POA: Diagnosis present

## 2011-08-16 DIAGNOSIS — N183 Chronic kidney disease, stage 3 unspecified: Secondary | ICD-10-CM | POA: Diagnosis present

## 2011-08-16 DIAGNOSIS — Z794 Long term (current) use of insulin: Secondary | ICD-10-CM

## 2011-08-16 DIAGNOSIS — I129 Hypertensive chronic kidney disease with stage 1 through stage 4 chronic kidney disease, or unspecified chronic kidney disease: Secondary | ICD-10-CM | POA: Diagnosis present

## 2011-08-16 LAB — OCCULT BLOOD, POC DEVICE: Fecal Occult Bld: NEGATIVE

## 2011-08-16 LAB — COMPREHENSIVE METABOLIC PANEL
AST: 7 U/L (ref 0–37)
BUN: 19 mg/dL (ref 6–23)
CO2: 21 mEq/L (ref 19–32)
Chloride: 107 mEq/L (ref 96–112)
Creatinine, Ser: 1.49 mg/dL — ABNORMAL HIGH (ref 0.50–1.10)
GFR calc non Af Amer: 37 mL/min — ABNORMAL LOW (ref >60)
Total Bilirubin: 0.1 mg/dL — ABNORMAL LOW (ref 0.3–1.2)

## 2011-08-16 LAB — GLUCOSE, CAPILLARY

## 2011-08-16 LAB — DIFFERENTIAL
Basophils Relative: 0 % (ref 0–1)
Lymphocytes Relative: 7 % — ABNORMAL LOW (ref 12–46)
Monocytes Absolute: 1 10*3/uL (ref 0.1–1.0)
Neutrophils Relative %: 83 % — ABNORMAL HIGH (ref 43–77)

## 2011-08-16 LAB — CBC
HCT: 21.3 % — ABNORMAL LOW (ref 36.0–46.0)
Hemoglobin: 5.9 g/dL — CL (ref 12.0–15.0)
MCH: 21.4 pg — ABNORMAL LOW (ref 26.0–34.0)
MCHC: 27.7 g/dL — ABNORMAL LOW (ref 30.0–36.0)
MCV: 77.2 fL — ABNORMAL LOW (ref 78.0–100.0)

## 2011-08-16 LAB — PREPARE RBC (CROSSMATCH)

## 2011-08-17 LAB — CBC
HCT: 23.7 % — ABNORMAL LOW (ref 36.0–46.0)
Hemoglobin: 6.8 g/dL — CL (ref 12.0–15.0)
MCH: 22.6 pg — ABNORMAL LOW (ref 26.0–34.0)
RBC: 3.01 MIL/uL — ABNORMAL LOW (ref 3.87–5.11)

## 2011-08-17 LAB — GLUCOSE, CAPILLARY
Glucose-Capillary: 147 mg/dL — ABNORMAL HIGH (ref 70–99)
Glucose-Capillary: 156 mg/dL — ABNORMAL HIGH (ref 70–99)

## 2011-08-17 LAB — BASIC METABOLIC PANEL
BUN: 23 mg/dL (ref 6–23)
CO2: 20 mEq/L (ref 19–32)
Calcium: 8.8 mg/dL (ref 8.4–10.5)
Glucose, Bld: 108 mg/dL — ABNORMAL HIGH (ref 70–99)
Sodium: 135 mEq/L (ref 135–145)

## 2011-08-18 LAB — CBC
Hemoglobin: 8.1 g/dL — ABNORMAL LOW (ref 12.0–15.0)
MCHC: 30.2 g/dL (ref 30.0–36.0)
RDW: 18.8 % — ABNORMAL HIGH (ref 11.5–15.5)
WBC: 19 10*3/uL — ABNORMAL HIGH (ref 4.0–10.5)

## 2011-08-18 LAB — BASIC METABOLIC PANEL
GFR calc Af Amer: 42 mL/min — ABNORMAL LOW (ref >60)
GFR calc non Af Amer: 35 mL/min — ABNORMAL LOW (ref >60)
Potassium: 4.4 mEq/L (ref 3.5–5.1)
Sodium: 135 mEq/L (ref 135–145)

## 2011-08-18 LAB — TYPE AND SCREEN
ABO/RH(D): B POS
Antibody Screen: NEGATIVE
Unit division: 0
Unit division: 0

## 2011-08-18 LAB — GLUCOSE, CAPILLARY: Glucose-Capillary: 80 mg/dL (ref 70–99)

## 2011-08-18 NOTE — H&P (Signed)
[name].:  [id number]  MEDICAL RECORD NO.:  [id number]  LOCATION:  [id number]                         FACILITY:  [facility]  PHYSICIAN:  Shelley Hodgkins, MD    DATE OF BIRTH:  1961-07-21  DATE OF ADMISSION:  08/16/2011 DATE OF DISCHARGE:                             HISTORY & PHYSICAL   REFERRING PHYSICIAN:  Dr. Alvino [name] in the Emergency Room.  PRIMARY CARE PHYSICIAN:  Dr. Harlan [name].  CHIEF COMPLAINT:  Shortness of breath.  HISTORY OF PRESENT ILLNESS:  This is a 50 year old woman who presents with increasing dyspnea on exertion.  The patient has a history of chronic microcytic anemia secondary to subacute bleeding from hidradenitis suppurativa of her abdomen.  She has been transfused in the past, last was in March of this year.  She has been followed by Dr. Tommy [name] from an infectious disease standpoint and recently had a CBC done, which revealed white blood cell count of 9, hemoglobin by report is 6.2. She is scheduled for outpatient transfusion; however, this was not able to be arranged by appointment and the patient did not feel that she could wait in the emergency room.  She went to see Dr. Dema [name] today for increasing shortness of breath, which has been ongoing over the last month and increased weight gain in her pannus, which she feels secondary to fluid.  Her hidradenitis is currently under good control she reports. In the emergency room, her hemoglobin was found to be 5.9.  She is admitted for further evaluation and treatment.  REVIEW OF SYSTEMS:  Negative for fever, changes in vision, sore throat, rash, muscle aches, chest pain, nausea, vomiting, abdominal pain, diarrhea, or dysuria.  Negative for hematemesis, or hemoptysis, or rectal bleeding.  Her shortness of breath is only associated with exertion.  PAST MEDICAL HISTORY: 1. Hidradenitis suppurativa. 2. Chronic kidney disease, stage 3. 3. Diabetes mellitus, type 2. 4. Nonischemic  cardiomyopathy with an ejection fraction of 35% to 45%. 5. Hypothyroidism. 6. Hyperlipidemia. 7. Chronic microcytic anemia secondary to hidradenitis. 8. Hypertension.  PAST SURGICAL HISTORY: 1. Port-A-Cath placement for frequent need for antibiotics. 2. I and D of her abdomen.  ALLERGIES: 1. AVANDIA and 2. AUGMENTIN, which cause GI upset only.  FAMILY HISTORY:  Mother and father with heart disease.  SOCIAL HISTORY:  Nonsmoker and nondrinker.  She lives here in [place].  Maintenance medications are obtained from the Dr. Orest [name] list, 1. Ascorbic acid 500 mg p.o. b.i.d. 2. Percocet 5/325 mg 1 tablet every 6 hours as needed. 3. Magnesium oxide 400 mg p.o. t.i.d. 4. Levothyroxine 25 mcg p.o. daily. 5. Spironolactone 25 mg p.o. daily. 6. Cymbalta 60 mg p.o. daily. 7. Folic acid 1 mg p.o. daily. 8. Pravastatin 40 mg p.o. daily. 9. Zinc 220 mg p.o. daily. 10.Vitamin C 500 mg p.o. daily. 11.Coreg 6.25 mg p.o. b.i.d. 12.Furosemide 40 mg p.o. daily. 13.Iron 325 mg p.o. t.i.d. 14.Humalog Mix 75/25, 35 units subcutaneously in the morning and 20     units subcutaneously in the evening. 15.Aspirin 81 mg p.o. daily. 16.Bactrim DS 1 tablet p.o. b.i.d.  PHYSICAL EXAMINATION:  [name]:  A well-developed and well-nourished woman in no acute distress.  VITAL SIGNS:  Temperature 98.2, blood pressure 146/84, saturation 97%, pulse 95, and respirations 22. HEAD:  Appears to be normal. EYES:  Sclerae clear.  Pupils equal, round, and reactive to light. Lids, irides, and conjunctivae appear normal. ENT:  Hearing is grossly normal.  Lips and tongue appear unremarkable. NECK:  Supple.  No lymphadenopathy or masses.  No thyromegaly. CHEST:  Clear to auscultation bilaterally.  No wheezes, rales, or rhonchi.  Normal respiratory effort. ABDOMEN:  Will be examined and documentation to follow. EXTREMITIES:  Examination of lower extremity edema to follow.  IMAGING:  Chest x-ray, vascular congestion  without edema.  LABORATORY STUDIES: 1. Hemoglobin notable for being 5.9, most recently on June 26th was     6.0, white cell count elevated to 17.0, which is chronic in nature. 2. Creatinine is 1.49, which appears to be at baseline.  BNP was     negative.  ASSESSMENT:  This is a 50 year old woman who presents with acute on chronic anemia. 1. Acute on chronic microcytic anemia secondary to hidradenitis.  We     will transfuse and check a CBC in the morning.  She denies any     other blood loss and she obviously claims stable at this point     making as a subacute process. 2. Chronic kidney disease, stage 3 that appears to be stable. 3. Macular edema.  We will increase her dose of Lasix.  She has no     evidence of respiratory compromise. 4. History of nonischemic cardiomyopathy appears to be stable.     Continue Coreg.  Not currently on ACE     inhibitor for unclear reasons, deferred to cardiologist. 5. Hidradenitis suppurativa.  This appears to be stable at this point.     Continue Bactrim. 6. Diabetes mellitus type 2 appears to be stable.  Continue her long-     standing insulin regimen.     Shelley Hodgkins, MD     DG/MEDQ  D:  08/16/2011  T:  08/16/2011  Job:  [id number]  cc:   [name]. [name], M.D. Fax: [phone]  Electronically Signed by [name]  on 08/18/2011 09:02:29 PM

## 2011-08-19 LAB — GLUCOSE, CAPILLARY
Glucose-Capillary: 102 mg/dL — ABNORMAL HIGH (ref 70–99)
Glucose-Capillary: 132 mg/dL — ABNORMAL HIGH (ref 70–99)
Glucose-Capillary: 147 mg/dL — ABNORMAL HIGH (ref 70–99)

## 2011-08-19 LAB — BASIC METABOLIC PANEL
BUN: 27 mg/dL — ABNORMAL HIGH (ref 6–23)
CO2: 22 mEq/L (ref 19–32)
Chloride: 103 mEq/L (ref 96–112)
Creatinine, Ser: 1.79 mg/dL — ABNORMAL HIGH (ref 0.50–1.10)

## 2011-08-19 LAB — CBC
HCT: 29 % — ABNORMAL LOW (ref 36.0–46.0)
Hemoglobin: 8.6 g/dL — ABNORMAL LOW (ref 12.0–15.0)
MCV: 79.7 fL (ref 78.0–100.0)
Platelets: 446 10*3/uL — ABNORMAL HIGH (ref 150–400)
RBC: 3.64 MIL/uL — ABNORMAL LOW (ref 3.87–5.11)
WBC: 19.4 10*3/uL — ABNORMAL HIGH (ref 4.0–10.5)

## 2011-08-20 LAB — BASIC METABOLIC PANEL
BUN: 31 mg/dL — ABNORMAL HIGH (ref 6–23)
GFR calc non Af Amer: 30 mL/min — ABNORMAL LOW (ref >60)
Glucose, Bld: 106 mg/dL — ABNORMAL HIGH (ref 70–99)
Potassium: 4.1 mEq/L (ref 3.5–5.1)

## 2011-08-20 LAB — GLUCOSE, CAPILLARY
Glucose-Capillary: 130 mg/dL — ABNORMAL HIGH (ref 70–99)
Glucose-Capillary: 178 mg/dL — ABNORMAL HIGH (ref 70–99)

## 2011-08-21 LAB — CBC
Hemoglobin: 8.7 g/dL — ABNORMAL LOW (ref 12.0–15.0)
MCHC: 29.6 g/dL — ABNORMAL LOW (ref 30.0–36.0)
RDW: 19.6 % — ABNORMAL HIGH (ref 11.5–15.5)
WBC: 23 10*3/uL — ABNORMAL HIGH (ref 4.0–10.5)

## 2011-08-21 LAB — BASIC METABOLIC PANEL
Chloride: 99 mEq/L (ref 96–112)
GFR calc Af Amer: 32 mL/min — ABNORMAL LOW (ref >60)
GFR calc non Af Amer: 27 mL/min — ABNORMAL LOW (ref >60)
Potassium: 4.3 mEq/L (ref 3.5–5.1)
Sodium: 133 mEq/L — ABNORMAL LOW (ref 135–145)

## 2011-08-21 LAB — GLUCOSE, CAPILLARY
Glucose-Capillary: 155 mg/dL — ABNORMAL HIGH (ref 70–99)
Glucose-Capillary: 164 mg/dL — ABNORMAL HIGH (ref 70–99)

## 2011-08-22 LAB — BASIC METABOLIC PANEL
Calcium: 9 mg/dL (ref 8.4–10.5)
Creatinine, Ser: 1.98 mg/dL — ABNORMAL HIGH (ref 0.50–1.10)
GFR calc non Af Amer: 27 mL/min — ABNORMAL LOW (ref >60)

## 2011-08-22 LAB — GLUCOSE, CAPILLARY
Glucose-Capillary: 97 mg/dL (ref 70–99)
Glucose-Capillary: 149 mg/dL — ABNORMAL HIGH (ref 70–99)
Glucose-Capillary: 135 mg/dL — ABNORMAL HIGH (ref 70–99)

## 2011-08-22 NOTE — Discharge Summary (Signed)
NAMEAMISHI, [name].:  [id number]  MEDICAL RECORD NO.:  [id number]  LOCATION:  [id number]                         FACILITY:  [facility]  PHYSICIAN:  [name], MD    DATE OF BIRTH:  [birth year]  DATE OF ADMISSION:  08/16/2011 DATE OF DISCHARGE:                        DISCHARGE SUMMARY - REFERRING   PRIMARY CARE PHYSICIAN:  Emeline General. White, MD  PRIMARY CARDIOLOGIST:  Fransico Him, MD  PRIMARY INFECTIOUS DISEASE PHYSICIAN:  Alcide Evener, MD  CONDITION ON DISCHARGE:  Improved.  DISPOSITION:  Home.  DISCHARGE DIAGNOSES: 1. Acute-on-chronic microcytic anemia without significant blood loss     during this hospitalization.  Status post 4 units packed red blood     cells. 2. Pannicular edema with volume overload predominately on her pannus     on her legs. 3. Nonischemic cardiomyopathy. 4. Acute renal failure, superimposed on chronic kidney disease stage 2-     3. 5. Diabetes mellitus, stable.  HISTORY OF PRESENT ILLNESS:  This is a 50 year old woman with a history of hidradenitis suppurativa and chronic blood loss from this who presented with shortness of breath, was found to be anemic.  She actually recently been seen by Dr. Tommy Medal and had blood work drawn and noted be anemic at that time.  Although she had been referred for transfusion, she did not end up getting this done.  She then was seen by Dr. Dema Severin and referred for admission.  HOSPITAL COURSE:  [name] was admitted and transfused initially 2 units packed red blood cells.  She had moderate response to this and she transfused an additional 2 units and her hemoglobin has been stable since.  She has had minimal amount of bleeding from her wounds.  She has had no upper or lower GI symptoms to explain bleeding and her bleeding is not abnormal from a gynecologic standpoint.  She does report that with her wound care, she does ooze a significant amount of blood chronically.  This is felt to be the  etiology.  Her hemoglobin has remained stable and from that aspect, she is certainly ready for discharge.  She does have a history of nonischemic cardiomyopathy and her respiratory status has remained well and stable during this hospitalization, but it was noted that she had quite a bit of weight gain compared to when she was last in the hospital in March of this year when she weighed 132 kg.  On admission here, she was 159.6 kg.  She has been diuresed well down to 151 kg and at this point, probably has reached a fairly dry weight for her.  She has had mild renal failure from diuresis and diuretics have been held today.  Basic metabolic panel will be repeated tomorrow and if stable, she can certainly be discharged home and resume her diuretics.  She really needs close followup with her primary care physician, Dr. Dema Severin, and with her cardiologist, Dr. Radford Pax.  Her cardiomyopathy itself seems to appear quite stable.  In regard to hidradenitis suppurativa, she felt that this had been worsening.  I have examined her and she has a minimal amount of exudate. Because of her concern for worsening and the question  whether she might need IV antibiotics, the case was discussed with her primary infectious disease physician, Dr. Tommy Medal, who fell probably IV antibiotics were indicated to suggest an inpatient consult.  I discussed the case with Dr. Megan Salon today who recommends no inpatient antibiotics.  This patient really needs definitive care which is in process at [facility]. She really needs to follow through with setting up an appointment with them for consideration for definitive therapy.  Antibiotics will not cure this condition.  Her condition appears to be stabilizing and plan will be for discharge home, September 26th.  CONSULTATIONS:  Infectious Disease, their recommendations as above.  PROCEDURES:  Transfusion 4 units packed red blood cells.  IMAGING:  Chest x-ray, September 19th:   Vascular congestion without edema.  MICROBIOLOGY:  None.  PERTINENT LABORATORY STUDIES: 1. ProBNP was 17.2. 2. CBC on admission was notable for a hemoglobin of 5.9.  On     discharge, 8.7 and stable.  Note that the patient has a persistent     leukocytosis, probably related to her hidradenitis rather     hidradenitis suppurativa.  Capillary blood sugars well controlled. 3. Basic metabolic panel notable for a creatinine on admission of     1.49, currently 1.98 and stabilizing.  She has been up into 3 range     to 4 when she was in acute renal failure.  It is hard to gauge her     baseline, it appears she has stage 2 to stage 3 chronic kidney     disease.  PHYSICAL EXAMINATION:  From September 26th, GENERAL:  The patient is overall feeling fairly well. VITAL SIGNS:  Her weight is stable at 151.4 kg today, temperature is 98.3, pulse 83, respirations 20, blood pressure 111/60, saturation 97% on room air. CARDIOVASCULAR:  Regular rate and rhythm.  No murmur, rub, or gallop. RESPIRATORY:  Clear to auscultation bilaterally.  No wheezes, rales, or rhonchi.  Normal respiratory effort.  DISCHARGE INSTRUCTIONS:  The patient will be discharged home, September 26th.  Her diet is a heart-healthy, low-salt, low-fat diet, and a diabetic diet.  Activities unrestricted.  Wound care is as previously directed without change.  Follow up with Dr. Harlan Stains in approximately 2 days to follow basic metabolic panel.  Follow up with Dr. Tommy Medal as needed and with Dr. Fransico Him in approximately 1 week.  DISCHARGE MEDICATIONS: 1. Vitamin C 500 mg p.o. daily. 2. Carvedilol 6.25 mg p.o. b.i.d. 3. Cymbalta 60 mg p.o. daily. 4. Iron 325 mg p.o. b.i.d. 5. Folic acid 1 mg p.o. daily. 6. Lasix 40 mg p.o. b.i.d. 7. Insulin 75/25, 20 units subcutaneously with dinner and 35 units     subcutaneously with breakfast. 8. Levothyroxine 25 mcg p.o. daily. 9. Magnesium oxide 400 mg p.o. b.i.d. 10.Percocet  5/325 mg 1-2 tablets by mouth every 4 hours as needed for     pain. 11.Pravachol 40 mg p.o. daily. 12.Spironolactone 25 mg p.o. daily. 13.Zinc 220 mg p.o. daily.  Recommend discontinuing aspirin at this point until followup with her physicians.  Time coordinating discharge is 35 minutes.     [name], MD     DG/MEDQ  D:  08/22/2011  T:  08/22/2011  Job:  [id number]  cc:   Emeline General. Dema Severin, M.D. [phone]  Fransico Him, M.D. Fax: [facility], [place] Fax: [phone]  Electronically Signed by [name]  on 08/22/2011 10:43:13 PM

## 2011-08-23 DIAGNOSIS — L732 Hidradenitis suppurativa: Secondary | ICD-10-CM

## 2011-08-23 LAB — BASIC METABOLIC PANEL
Chloride: 102 mEq/L (ref 96–112)
GFR calc Af Amer: 31 mL/min — ABNORMAL LOW (ref >60)
GFR calc non Af Amer: 26 mL/min — ABNORMAL LOW (ref >60)
Potassium: 4.4 mEq/L (ref 3.5–5.1)

## 2011-08-23 LAB — GLUCOSE, CAPILLARY: Glucose-Capillary: 91 mg/dL (ref 70–99)

## 2011-08-24 LAB — CROSSMATCH
ABO/RH(D): B POS
Antibody Screen: NEGATIVE

## 2011-08-24 LAB — CBC
HCT: 28.3 — ABNORMAL LOW
MCHC: 30.6
MCV: 73.7 — ABNORMAL LOW
Platelets: 449 — ABNORMAL HIGH
Platelets: 507 — ABNORMAL HIGH
RBC: 3.38 — ABNORMAL LOW
RDW: 19.8 — ABNORMAL HIGH
WBC: 14.3 — ABNORMAL HIGH
WBC: 14.8 — ABNORMAL HIGH

## 2011-08-24 LAB — HEMOGLOBIN A1C: Mean Plasma Glucose: 218

## 2011-08-24 LAB — BASIC METABOLIC PANEL
BUN: 12
BUN: 9
Calcium: 8.7
Chloride: 104
Creatinine, Ser: 0.9
Creatinine, Ser: 0.95
GFR calc Af Amer: >60
GFR calc non Af Amer: >60
GFR calc non Af Amer: >60
Potassium: 3.9

## 2011-08-24 LAB — HEPATIC FUNCTION PANEL
ALT: 12
AST: 14
Albumin: 2.3 — ABNORMAL LOW
Bilirubin, Direct: 0.1
Total Protein: 7.5

## 2011-08-24 LAB — B-NATRIURETIC PEPTIDE (CONVERTED LAB): Pro B Natriuretic peptide (BNP): 140 — ABNORMAL HIGH

## 2011-08-24 LAB — IRON AND TIBC: Iron: 17 — ABNORMAL LOW

## 2011-08-24 LAB — PROTIME-INR
INR: 1.2
Prothrombin Time: 15.6 — ABNORMAL HIGH

## 2011-08-24 LAB — TSH: TSH: 3.856

## 2011-08-24 LAB — HEMOGLOBIN AND HEMATOCRIT, BLOOD
HCT: 30 — ABNORMAL LOW
Hemoglobin: 9.3 — ABNORMAL LOW

## 2011-08-24 LAB — ABO/RH: ABO/RH(D): B POS

## 2011-09-08 LAB — CBC
HCT: 35.8 — ABNORMAL LOW
HCT: 36.7
Hemoglobin: 11.3 — ABNORMAL LOW
Hemoglobin: 11.5 — ABNORMAL LOW
MCHC: 31.8
MCV: 78.5
MCV: 79.7
Platelets: 317
Platelets: 327
RBC: 4.46
RDW: 17.3 — ABNORMAL HIGH
RDW: 17.3 — ABNORMAL HIGH

## 2011-09-08 LAB — DIFFERENTIAL
Basophils Absolute: 0
Eosinophils Relative: 3
Lymphocytes Relative: 9 — ABNORMAL LOW
Lymphs Abs: 1.1
Monocytes Absolute: 0.8 — ABNORMAL HIGH
Monocytes Relative: 6

## 2011-09-08 LAB — BASIC METABOLIC PANEL
BUN: 12
BUN: 15
CO2: 30
CO2: 31
CO2: 32
Chloride: 102
Chloride: 104
Chloride: 104
Creatinine, Ser: 0.86
GFR calc Af Amer: >60
Glucose, Bld: 174 — ABNORMAL HIGH
Glucose, Bld: 200 — ABNORMAL HIGH
Potassium: 3.7
Potassium: 4.4
Sodium: 140

## 2011-09-08 LAB — CARDIAC PANEL(CRET KIN+CKTOT+MB+TROPI)
CK, MB: 2.4
Total CK: 216 — ABNORMAL HIGH

## 2011-09-08 LAB — URINALYSIS, DIPSTICK ONLY
Nitrite: NEGATIVE
Specific Gravity, Urine: 1.046 — ABNORMAL HIGH
Urobilinogen, UA: 1

## 2011-09-08 LAB — URINE CULTURE: Special Requests: NEGATIVE

## 2011-09-08 LAB — HEMOGLOBIN A1C: Mean Plasma Glucose: 247

## 2011-09-11 LAB — DIFFERENTIAL
Basophils Relative: 0
Lymphs Abs: 1.2
Monocytes Relative: 4
Neutro Abs: 12.7 — ABNORMAL HIGH
Neutrophils Relative %: 86 — ABNORMAL HIGH

## 2011-09-11 LAB — TROPONIN I: Troponin I: 0.04

## 2011-09-11 LAB — D-DIMER, QUANTITATIVE: D-Dimer, Quant: 1.11 — ABNORMAL HIGH

## 2011-09-11 LAB — COMPREHENSIVE METABOLIC PANEL
Albumin: 3 — ABNORMAL LOW
Alkaline Phosphatase: 75
BUN: 10
Chloride: 106
Creatinine, Ser: 0.78
Glucose, Bld: 172 — ABNORMAL HIGH
Potassium: 3.5
Total Bilirubin: 0.9
Total Protein: 7.7

## 2011-09-11 LAB — B-NATRIURETIC PEPTIDE (CONVERTED LAB): Pro B Natriuretic peptide (BNP): 381 — ABNORMAL HIGH

## 2011-09-11 LAB — CBC
MCHC: 32.7
Platelets: 319
RDW: 17.1 — ABNORMAL HIGH

## 2011-09-11 LAB — CK TOTAL AND CKMB (NOT AT ARMC)
CK, MB: 2.5
Relative Index: 1.4

## 2011-09-11 LAB — CARDIAC PANEL(CRET KIN+CKTOT+MB+TROPI): CK, MB: 2.3

## 2012-01-19 ENCOUNTER — Emergency Department (HOSPITAL_COMMUNITY): Payer: BC Managed Care – PPO

## 2012-01-19 ENCOUNTER — Inpatient Hospital Stay (HOSPITAL_COMMUNITY)
Admission: EM | Admit: 2012-01-19 | Discharge: 2012-01-30 | DRG: 544 | Disposition: A | Discharge status: Home / Self Care | Payer: BC Managed Care – PPO | Source: Ambulatory Visit | Attending: Internal Medicine | Admitting: Internal Medicine

## 2012-01-19 ENCOUNTER — Encounter (HOSPITAL_COMMUNITY): Payer: Self-pay | Admitting: Emergency Medicine

## 2012-01-19 ENCOUNTER — Other Ambulatory Visit: Payer: Self-pay

## 2012-01-19 DIAGNOSIS — E875 Hyperkalemia: Secondary | ICD-10-CM | POA: Diagnosis present

## 2012-01-19 DIAGNOSIS — Z9119 Patient's noncompliance with other medical treatment and regimen: Secondary | ICD-10-CM

## 2012-01-19 DIAGNOSIS — E039 Hypothyroidism, unspecified: Secondary | ICD-10-CM | POA: Diagnosis present

## 2012-01-19 DIAGNOSIS — A498 Other bacterial infections of unspecified site: Secondary | ICD-10-CM | POA: Diagnosis present

## 2012-01-19 DIAGNOSIS — R0602 Shortness of breath: Secondary | ICD-10-CM | POA: Diagnosis present

## 2012-01-19 DIAGNOSIS — I5020 Unspecified systolic (congestive) heart failure: Secondary | ICD-10-CM

## 2012-01-19 DIAGNOSIS — I129 Hypertensive chronic kidney disease with stage 1 through stage 4 chronic kidney disease, or unspecified chronic kidney disease: Secondary | ICD-10-CM | POA: Diagnosis present

## 2012-01-19 DIAGNOSIS — N179 Acute kidney failure, unspecified: Secondary | ICD-10-CM | POA: Diagnosis present

## 2012-01-19 DIAGNOSIS — D72829 Elevated white blood cell count, unspecified: Secondary | ICD-10-CM | POA: Diagnosis present

## 2012-01-19 DIAGNOSIS — F0630 Mood disorder due to known physiological condition, unspecified: Secondary | ICD-10-CM

## 2012-01-19 DIAGNOSIS — D509 Iron deficiency anemia, unspecified: Secondary | ICD-10-CM | POA: Diagnosis present

## 2012-01-19 DIAGNOSIS — N390 Urinary tract infection, site not specified: Secondary | ICD-10-CM | POA: Diagnosis present

## 2012-01-19 DIAGNOSIS — L732 Hidradenitis suppurativa: Secondary | ICD-10-CM | POA: Diagnosis present

## 2012-01-19 DIAGNOSIS — I428 Other cardiomyopathies: Secondary | ICD-10-CM | POA: Diagnosis present

## 2012-01-19 DIAGNOSIS — I10 Essential (primary) hypertension: Secondary | ICD-10-CM | POA: Diagnosis present

## 2012-01-19 DIAGNOSIS — L88 Pyoderma gangrenosum: Secondary | ICD-10-CM

## 2012-01-19 DIAGNOSIS — I5023 Acute on chronic systolic (congestive) heart failure: Principal | ICD-10-CM | POA: Diagnosis present

## 2012-01-19 DIAGNOSIS — Z91199 Patient's noncompliance with other medical treatment and regimen due to unspecified reason: Secondary | ICD-10-CM

## 2012-01-19 DIAGNOSIS — E042 Nontoxic multinodular goiter: Secondary | ICD-10-CM

## 2012-01-19 DIAGNOSIS — N183 Chronic kidney disease, stage 3 unspecified: Secondary | ICD-10-CM | POA: Diagnosis present

## 2012-01-19 DIAGNOSIS — I509 Heart failure, unspecified: Secondary | ICD-10-CM | POA: Diagnosis present

## 2012-01-19 DIAGNOSIS — E119 Type 2 diabetes mellitus without complications: Secondary | ICD-10-CM | POA: Diagnosis present

## 2012-01-19 HISTORY — DX: Morbid (severe) obesity due to excess calories: E66.01

## 2012-01-19 HISTORY — DX: Essential (primary) hypertension: I10

## 2012-01-19 HISTORY — DX: Iron deficiency anemia, unspecified: D50.9

## 2012-01-19 HISTORY — DX: Hypothyroidism, unspecified: E03.9

## 2012-01-19 HISTORY — DX: Type 2 diabetes mellitus without complications: E11.9

## 2012-01-19 HISTORY — DX: Hyperkalemia: E87.5

## 2012-01-19 LAB — DIFFERENTIAL
Basophils Relative: 0 % (ref 0–1)
Eosinophils Absolute: 0.7 10*3/uL (ref 0.0–0.7)
Eosinophils Relative: 4 % (ref 0–5)
Lymphs Abs: 1.4 10*3/uL (ref 0.7–4.0)
Monocytes Absolute: 0.7 10*3/uL (ref 0.1–1.0)
Neutro Abs: 14.9 10*3/uL — ABNORMAL HIGH (ref 1.7–7.7)
Neutrophils Relative %: 84 % — ABNORMAL HIGH (ref 43–77)

## 2012-01-19 LAB — URINE MICROSCOPIC-ADD ON

## 2012-01-19 LAB — URINALYSIS, ROUTINE W REFLEX MICROSCOPIC
Glucose, UA: NEGATIVE mg/dL
Ketones, ur: NEGATIVE mg/dL
Leukocytes, UA: NEGATIVE
Protein, ur: 30 mg/dL — AB
Urobilinogen, UA: 0.2 mg/dL (ref 0.0–1.0)

## 2012-01-19 LAB — COMPREHENSIVE METABOLIC PANEL
AST: 9 U/L (ref 0–37)
BUN: 44 mg/dL — ABNORMAL HIGH (ref 6–23)
CO2: 19 mEq/L (ref 19–32)
Calcium: 8.8 mg/dL (ref 8.4–10.5)
Chloride: 105 mEq/L (ref 96–112)
Creatinine, Ser: 2.36 mg/dL — ABNORMAL HIGH (ref 0.50–1.10)
GFR calc Af Amer: 26 mL/min — ABNORMAL LOW (ref >90)
GFR calc non Af Amer: 23 mL/min — ABNORMAL LOW (ref >90)
Glucose, Bld: 115 mg/dL — ABNORMAL HIGH (ref 70–99)
Total Bilirubin: 0.4 mg/dL (ref 0.3–1.2)

## 2012-01-19 LAB — CBC
Hemoglobin: 8.7 g/dL — ABNORMAL LOW (ref 12.0–15.0)
MCH: 20.2 pg — ABNORMAL LOW (ref 26.0–34.0)
MCHC: 27.6 g/dL — ABNORMAL LOW (ref 30.0–36.0)
MCV: 73.3 fL — ABNORMAL LOW (ref 78.0–100.0)
RBC: 4.3 MIL/uL (ref 3.87–5.11)

## 2012-01-19 LAB — TROPONIN I: Troponin I: <0.3 ng/mL (ref <0.30)

## 2012-01-19 LAB — PRO B NATRIURETIC PEPTIDE: Pro B Natriuretic peptide (BNP): 12043 pg/mL — ABNORMAL HIGH (ref 0–125)

## 2012-01-19 MED ORDER — FUROSEMIDE 10 MG/ML IJ SOLN
80.0000 mg | Freq: Once | INTRAMUSCULAR | Status: AC
  Administered 2012-01-19: 80 mg via INTRAVENOUS
  Filled 2012-01-19 (×2): qty 4

## 2012-01-19 MED ORDER — NITROGLYCERIN 2 % TD OINT
1.0000 [in_us] | TOPICAL_OINTMENT | Freq: Once | TRANSDERMAL | Status: AC
  Administered 2012-01-19: 1 [in_us] via TOPICAL
  Filled 2012-01-19: qty 1

## 2012-01-19 MED ORDER — FOLIC ACID 1 MG PO TABS
1.0000 mg | ORAL_TABLET | Freq: Every day | ORAL | Status: DC
  Administered 2012-01-20 – 2012-01-30 (×11): 1 mg via ORAL
  Filled 2012-01-19 (×11): qty 1

## 2012-01-19 MED ORDER — ZINC SULFATE 220 (50 ZN) MG PO CAPS
220.0000 mg | ORAL_CAPSULE | Freq: Every day | ORAL | Status: DC
  Administered 2012-01-20 – 2012-01-30 (×11): 220 mg via ORAL
  Filled 2012-01-19 (×11): qty 1

## 2012-01-19 MED ORDER — CARVEDILOL 3.125 MG PO TABS
3.1250 mg | ORAL_TABLET | Freq: Two times a day (BID) | ORAL | Status: DC
  Administered 2012-01-20 – 2012-01-30 (×20): 3.125 mg via ORAL
  Filled 2012-01-19 (×24): qty 1

## 2012-01-19 MED ORDER — VITAMIN C 500 MG PO TABS
500.0000 mg | ORAL_TABLET | Freq: Two times a day (BID) | ORAL | Status: DC
  Administered 2012-01-20 – 2012-01-30 (×21): 500 mg via ORAL
  Filled 2012-01-19 (×22): qty 1

## 2012-01-19 MED ORDER — INSULIN ASPART 100 UNIT/ML ~~LOC~~ SOLN
0.0000 [IU] | Freq: Three times a day (TID) | SUBCUTANEOUS | Status: DC
  Administered 2012-01-20 – 2012-01-22 (×3): 1 [IU] via SUBCUTANEOUS
  Administered 2012-01-22: 2 [IU] via SUBCUTANEOUS
  Administered 2012-01-23 (×2): 1 [IU] via SUBCUTANEOUS
  Administered 2012-01-24 – 2012-01-27 (×3): 2 [IU] via SUBCUTANEOUS
  Administered 2012-01-27 – 2012-01-29 (×4): 1 [IU] via SUBCUTANEOUS
  Filled 2012-01-19: qty 3

## 2012-01-19 MED ORDER — INSULIN ASPART PROT & ASPART (70-30 MIX) 100 UNIT/ML ~~LOC~~ SUSP
20.0000 [IU] | Freq: Every day | SUBCUTANEOUS | Status: DC
  Administered 2012-01-20 – 2012-01-24 (×5): 20 [IU] via SUBCUTANEOUS

## 2012-01-19 MED ORDER — INSULIN ASPART 100 UNIT/ML ~~LOC~~ SOLN: 0.0000 [IU] | Freq: Every day | SUBCUTANEOUS | Status: DC

## 2012-01-19 MED ORDER — FERROUS SULFATE 325 (65 FE) MG PO TABS
325.0000 mg | ORAL_TABLET | Freq: Two times a day (BID) | ORAL | Status: DC
  Administered 2012-01-20 – 2012-01-30 (×21): 325 mg via ORAL
  Filled 2012-01-19 (×23): qty 1

## 2012-01-19 MED ORDER — DULOXETINE HCL 60 MG PO CPEP
60.0000 mg | ORAL_CAPSULE | Freq: Every day | ORAL | Status: DC
  Administered 2012-01-20 – 2012-01-30 (×11): 60 mg via ORAL
  Filled 2012-01-19 (×11): qty 1

## 2012-01-19 MED ORDER — LEVOTHYROXINE SODIUM 25 MCG PO TABS
25.0000 ug | ORAL_TABLET | Freq: Every day | ORAL | Status: DC
  Administered 2012-01-20 – 2012-01-30 (×11): 25 ug via ORAL
  Filled 2012-01-19 (×12): qty 1

## 2012-01-19 MED ORDER — LEVOTHYROXINE SODIUM 50 MCG PO TABS: 50.0000 ug | ORAL_TABLET | Freq: Every day | ORAL | Status: DC

## 2012-01-19 MED ORDER — SIMVASTATIN 20 MG PO TABS
20.0000 mg | ORAL_TABLET | Freq: Every day | ORAL | Status: DC
  Administered 2012-01-20 – 2012-01-29 (×10): 20 mg via ORAL
  Filled 2012-01-19 (×11): qty 1

## 2012-01-19 MED ORDER — INSULIN ASPART PROT & ASPART (70-30 MIX) 100 UNIT/ML ~~LOC~~ SUSP
35.0000 [IU] | Freq: Every day | SUBCUTANEOUS | Status: DC
  Administered 2012-01-20 – 2012-01-24 (×4): 35 [IU] via SUBCUTANEOUS
  Filled 2012-01-19: qty 3

## 2012-01-19 NOTE — ED Notes (Signed)
Pt to ED via ambulance S/P fall.    Pt also with c/o increasing SOB over last month.

## 2012-01-19 NOTE — ED Notes (Signed)
IV team coming to assess pt.

## 2012-01-19 NOTE — ED Provider Notes (Cosign Needed)
History     CSN: [id number]  Arrival date & time 01/19/12  61   First MD Initiated Contact with Patient 01/19/12 1639      Chief Complaint  Patient presents with  . Fall  . Shortness of Breath    (Consider location/radiation/quality/duration/timing/severity/associated sxs/prior treatment) HPI  Patient relates today she was trying to get out of her chair at work in the chair slipped and she fell. She states she did not hit her head or have loss of consciousness. She states she was unable to get up after she fell. She denies having any new injury. Patient presents via EMS.  Patient states her main concern is that she is started having swelling over the past month with shortness of breath also over the past month it's getting worse. She has a cough that sometimes has white sputum. She's unsure of fever. She denies any chest pain. She states she's had this before with congestive heart failure. She states she's taking her fluid pills twice a day.  PCP Harlan Stains, patient has an appointment in 3 days Cardiologist DrTracy Turner  History reviewed. No pertinent past medical history. Congestive heart failure Diabetes Hypothyroidism High cholesterol Hypertension  History reviewed. No pertinent past surgical history.  History reviewed. No pertinent family history.  History  Substance Use Topics  . Smoking status: Never Smoker   . Smokeless tobacco: Not on file  . Alcohol Use: No   employed Does not have oxygen at home  OB History    Grav Para Term Preterm Abortions TAB SAB Ect Mult Living                  Review of Systems  All other systems reviewed and are negative.    Allergies  Augmentin; Rosiglitazone maleate; and Amoxicillin  Home Medications   Current Outpatient Rx  Name Route Sig Dispense Refill  . ASCORBIC ACID 500 MG PO TABS Oral Take 500 mg by mouth 2 (two) times daily.      Marland Kitchen CARVEDILOL 3.125 MG PO TABS Oral Take 3.125 mg by mouth 2 (two) times  daily with a meal. Take one tab by mouth with food twice a day     . DULOXETINE HCL 60 MG PO CPEP Oral Take 60 mg by mouth daily.      Marland Kitchen FERROUS SULFATE 325 (65 FE) MG PO TABS Oral Take 325 mg by mouth 2 (two) times daily.      Marland Kitchen FOLIC ACID 1 MG PO TABS Oral Take 1 mg by mouth daily.    . FUROSEMIDE 40 MG PO TABS  40 mg. Take 2 tablets twice a day     . HYDROCODONE-ACETAMINOPHEN 5-500 MG PO TABS  Take 1-2 tablets by mouth every 6 hours for pain     . INSULIN LISPRO PROT & LISPRO (75-25) 100 UNIT/ML [place] SUSP  Give 35 units subq every morning and 20 units sub q every PM     . LEVOTHYROXINE SODIUM 25 MCG PO TABS Oral Take 25 mcg by mouth daily.    Marland Kitchen LEVOTHYROXINE SODIUM 50 MCG PO TABS Oral Take 50 mcg by mouth. Each morning on an empty stomach 30 before meal     . MAGNESIUM OXIDE 400 MG PO TABS Oral Take 400 mg by mouth 2 (two) times daily.     . OXYCODONE-ACETAMINOPHEN 5-325 MG PO TABS Oral Take 1 tablet by mouth every 6 (six) hours as needed. For pain    . PRAVASTATIN SODIUM 40 MG  PO TABS Oral Take 40 mg by mouth daily.      Marland Kitchen SPIRONOLACTONE 25 MG PO TABS Oral Take 25 mg by mouth daily.     Marland Kitchen ZINC SULFATE 220 MG PO CAPS Oral Take 220 mg by mouth daily.      BP 134/75  Pulse 77  Temp(Src) 98.9 F (37.2 C) (Oral)  Resp 22  SpO2 99%  Vital signs normal    Physical Exam  Nursing note and vitals reviewed. Constitutional: She is oriented to person, place, and time. She appears well-developed and well-nourished.  Non-toxic appearance. She does not appear ill. No distress.       Morbidly obese  HENT:  Head: Normocephalic and atraumatic.  Right Ear: External ear normal.  Left Ear: External ear normal.  Nose: Nose normal. No mucosal edema or rhinorrhea.  Mouth/Throat: Oropharynx is clear and moist and mucous membranes are normal. No dental abscesses or uvula swelling.  Eyes: Conjunctivae and EOM are normal. Pupils are equal, round, and reactive to light.  Neck: Normal range of motion and  full passive range of motion without pain. Neck supple.  Cardiovascular: Normal rate, regular rhythm and normal heart sounds.  Exam reveals no gallop and no friction rub.   No murmur heard. Pulmonary/Chest: Effort normal and breath sounds normal. No respiratory distress. She has no wheezes. She has no rhonchi. She has no rales. She exhibits no tenderness and no crepitus.       Patient has very diminished breath sounds mainly because of her chest wall thickness.  Abdominal: Soft. Normal appearance and bowel sounds are normal. She exhibits no distension. There is no tenderness. There is no rebound and no guarding.  Musculoskeletal: Normal range of motion. She exhibits edema. She exhibits no tenderness.       Patient has trace pitting edema of her lower extremities. Patient relates she has been all the way upper abdomen into her breast however I do not find any pitting edema.  Neurological: She is alert and oriented to person, place, and time. She has normal strength. No cranial nerve deficit.  Skin: Skin is warm, dry and intact. No rash noted. No erythema. No pallor.  Psychiatric: She has a normal mood and affect. Her speech is normal and behavior is normal. Her mood appears not anxious.    ED Course  Procedures (including critical care time)   Medications  nitroGLYCERIN (NITROGLYN) 2 % ointment 1 inch (1 inch Topical Given 01/19/12 2106)  furosemide (LASIX) injection 80 mg (80 mg Intravenous Given 01/19/12 2059)    Foley catheter placed.     23:24 Dr Gasper Lloyd, admit to tele, team 7, [place]  Results for orders placed during the hospital encounter of 01/19/12  CBC      Component Value Range   WBC 17.7 (*) 4.0 - 10.5 (K/uL)   RBC 4.30  3.87 - 5.11 (MIL/uL)   Hemoglobin 8.7 (*) 12.0 - 15.0 (g/dL)   HCT 31.5 (*) 36.0 - 46.0 (%)   MCV 73.3 (*) 78.0 - 100.0 (fL)   MCH 20.2 (*) 26.0 - 34.0 (pg)   MCHC 27.6 (*) 30.0 - 36.0 (g/dL)   RDW 20.3 (*) 11.5 - 15.5 (%)   Platelets 423 (*) 150  - 400 (K/uL)  DIFFERENTIAL      Component Value Range   Neutrophils Relative 84 (*) 43 - 77 (%)   Lymphocytes Relative 8 (*) 12 - 46 (%)   Monocytes Relative 4  3 - 12 (%)  Eosinophils Relative 4  0 - 5 (%)   Basophils Relative 0  0 - 1 (%)   Neutro Abs 14.9 (*) 1.7 - 7.7 (K/uL)   Lymphs Abs 1.4  0.7 - 4.0 (K/uL)   Monocytes Absolute 0.7  0.1 - 1.0 (K/uL)   Eosinophils Absolute 0.7  0.0 - 0.7 (K/uL)   Basophils Absolute 0.0  0.0 - 0.1 (K/uL)   RBC Morphology POLYCHROMASIA PRESENT     Smear Review LARGE PLATELETS PRESENT    COMPREHENSIVE METABOLIC PANEL      Component Value Range   Sodium 136  135 - 145 (mEq/L)   Potassium 5.4 (*) 3.5 - 5.1 (mEq/L)   Chloride 105  96 - 112 (mEq/L)   CO2 19  19 - 32 (mEq/L)   Glucose, Bld 115 (*) 70 - 99 (mg/dL)   BUN 44 (*) 6 - 23 (mg/dL)   Creatinine, Ser 2.36 (*) 0.50 - 1.10 (mg/dL)   Calcium 8.8  8.4 - 10.5 (mg/dL)   Total Protein 9.4 (*) 6.0 - 8.3 (g/dL)   Albumin 2.4 (*) 3.5 - 5.2 (g/dL)   AST 9  0 - 37 (U/L)   ALT <5  0 - 35 (U/L)   Alkaline Phosphatase 153 (*) 39 - 117 (U/L)   Total Bilirubin 0.4  0.3 - 1.2 (mg/dL)   GFR calc non Af Amer 23 (*) >90 (mL/min)   GFR calc Af Amer 26 (*) >90 (mL/min)  PRO B NATRIURETIC PEPTIDE      Component Value Range   Pro B Natriuretic peptide (BNP) 12043.0 (*) 0 - 125 (pg/mL)  URINALYSIS, ROUTINE W REFLEX MICROSCOPIC      Component Value Range   Color, Urine YELLOW  YELLOW    APPearance CLEAR  CLEAR    Specific Gravity, Urine 1.010  1.005 - 1.030    pH 5.0  5.0 - 8.0    Glucose, UA NEGATIVE  NEGATIVE (mg/dL)   Hgb urine dipstick MODERATE (*) NEGATIVE    Bilirubin Urine NEGATIVE  NEGATIVE    Ketones, ur NEGATIVE  NEGATIVE (mg/dL)   Protein, ur 30 (*) NEGATIVE (mg/dL)   Urobilinogen, UA 0.2  0.0 - 1.0 (mg/dL)   Nitrite NEGATIVE  NEGATIVE    Leukocytes, UA NEGATIVE  NEGATIVE   URINE MICROSCOPIC-ADD ON      Component Value Range   Squamous Epithelial / LPF RARE  RARE    WBC, UA 0-2  <3  (WBC/hpf)   RBC / HPF 7-10  <3 (RBC/hpf)   Casts HYALINE CASTS (*) NEGATIVE    Urine-Other AMORPHOUS URATES/PHOSPHATES    TROPONIN I      Component Value Range   Troponin I <0.30  <0.30 (ng/mL)   Laboratory interpretation all normal except stable anemia, very elevated BNP compared to prior labs    Dg Chest [facility] 1 View  01/19/2012  *RADIOLOGY REPORT*  Clinical Data: [place] earlier today.  History of hypertension, diabetes, more obesity, and CHF.  PORTABLE CHEST - 1 VIEW 01/19/2012 1740 hours:  Comparison: Two-view chest x-ray 08/16/2011 [facility] in 12/20/2009 [facility].  Findings: Patient rotated to the right.  Underexposed image due to portable technique.  Apparent right jugular central venous catheter which can be followed to the upper SVC, though the tip is not visible.  Cardiac silhouette enlarged but stable.  Pulmonary venous hypertension.  Apparent hazy opacity over the left lung and the right mid lung is felt to be artifactual and related to the rotation.  IMPRESSION: Suboptimal examination due to patient rotation and portable technique.  Stable cardiomegaly.  Pulmonary venous hypertension.  Repeat imaging on the fixed radiographic unit would be suggested when the patient is clinically able.  Original Report Authenticated By: Deniece Portela, M.D.     Date: 01/19/2012  Rate: 79  Rhythm: normal sinus rhythm  QRS Axis: normal  Intervals: normal  ST/T Wave abnormalities: nonspecific T wave changes  Conduction Disutrbances:none  Narrative Interpretation:   Old EKG Reviewed: unchanged from 08/09/2011     1. Congestive heart failure     PLAN ADMISSION  Rolland Porter, MD, [place], MD 01/19/12 [phone]

## 2012-01-19 NOTE — ED Notes (Signed)
Lab to bedside.  Pt states she has port and would like labs drawn from there.  IV team paged, primary RN notified.

## 2012-01-19 NOTE — Progress Notes (Signed)
IV Team- Accessed pt right chest pac. Pac has no blood return, and pt c/o burning with 11ml ns flush. Pt requested to take needle out, and start a piv. PIV started, and bedside RN notified that IV Team has had two different IV nurses to attempt pac access with no success. Pt requests to consult with MD to remove pac. Randol Kern, RN

## 2012-01-19 NOTE — ED Notes (Signed)
Informed by IV team unable to access port.  Per Levada Dy, RN  Two attempts were made.  Levada Dy, RN to notify next shift of the patient's need for access.  Dr Tomi Bamberger made aware of situation regarding delay in administration of medications.

## 2012-01-20 ENCOUNTER — Encounter (HOSPITAL_COMMUNITY): Payer: Self-pay | Admitting: Internal Medicine

## 2012-01-20 DIAGNOSIS — D72829 Elevated white blood cell count, unspecified: Secondary | ICD-10-CM | POA: Diagnosis present

## 2012-01-20 DIAGNOSIS — E875 Hyperkalemia: Secondary | ICD-10-CM | POA: Diagnosis present

## 2012-01-20 LAB — GLUCOSE, CAPILLARY
Glucose-Capillary: 133 mg/dL — ABNORMAL HIGH (ref 70–99)
Glucose-Capillary: 140 mg/dL — ABNORMAL HIGH (ref 70–99)
Glucose-Capillary: 119 mg/dL — ABNORMAL HIGH (ref 70–99)
Glucose-Capillary: 94 mg/dL (ref 70–99)

## 2012-01-20 LAB — CARDIAC PANEL(CRET KIN+CKTOT+MB+TROPI)
CK, MB: 2.9 ng/mL (ref 0.3–4.0)
Relative Index: INVALID (ref 0.0–2.5)
Relative Index: INVALID (ref 0.0–2.5)
Relative Index: INVALID (ref 0.0–2.5)
Total CK: 91 U/L (ref 7–177)
Total CK: 80 U/L (ref 7–177)

## 2012-01-20 LAB — URINALYSIS, ROUTINE W REFLEX MICROSCOPIC
Nitrite: NEGATIVE
Specific Gravity, Urine: 1.012 (ref 1.005–1.030)
Urobilinogen, UA: 0.2 mg/dL (ref 0.0–1.0)

## 2012-01-20 LAB — URINE MICROSCOPIC-ADD ON

## 2012-01-20 LAB — LIPID PANEL
Cholesterol: 80 mg/dL (ref 0–200)
HDL: 28 mg/dL — ABNORMAL LOW (ref >39)
Total CHOL/HDL Ratio: 2.9 RATIO

## 2012-01-20 MED ORDER — ACETAMINOPHEN 325 MG PO TABS
650.0000 mg | ORAL_TABLET | ORAL | Status: DC | PRN
  Administered 2012-01-28 – 2012-01-29 (×3): 650 mg via ORAL
  Filled 2012-01-20 (×3): qty 2

## 2012-01-20 MED ORDER — SODIUM CHLORIDE 0.9 % IV SOLN: 250.0000 mL | INTRAVENOUS | Status: DC | PRN

## 2012-01-20 MED ORDER — ASPIRIN EC 325 MG PO TBEC
325.0000 mg | DELAYED_RELEASE_TABLET | Freq: Every day | ORAL | Status: DC
  Administered 2012-01-20 – 2012-01-25 (×6): 325 mg via ORAL
  Filled 2012-01-20 (×6): qty 1

## 2012-01-20 MED ORDER — SODIUM CHLORIDE 0.9 % IJ SOLN: 3.0000 mL | INTRAMUSCULAR | Status: DC | PRN

## 2012-01-20 MED ORDER — TORSEMIDE 20 MG PO TABS
20.0000 mg | ORAL_TABLET | Freq: Two times a day (BID) | ORAL | Status: DC
  Administered 2012-01-20 – 2012-01-22 (×4): 20 mg via ORAL
  Filled 2012-01-20 (×5): qty 1

## 2012-01-20 MED ORDER — OXYCODONE-ACETAMINOPHEN 5-325 MG PO TABS
1.0000 | ORAL_TABLET | Freq: Four times a day (QID) | ORAL | Status: DC | PRN
  Administered 2012-01-20 – 2012-01-30 (×34): 1 via ORAL
  Filled 2012-01-20 (×34): qty 1

## 2012-01-20 MED ORDER — VANCOMYCIN HCL 1000 MG IV SOLR
2000.0000 mg | INTRAVENOUS | Status: DC
  Administered 2012-01-20 – 2012-01-22 (×3): 2000 mg via INTRAVENOUS
  Filled 2012-01-20 (×4): qty 2000

## 2012-01-20 MED ORDER — ENOXAPARIN SODIUM 40 MG/0.4ML ~~LOC~~ SOLN
40.0000 mg | Freq: Every day | SUBCUTANEOUS | Status: DC
  Administered 2012-01-20 – 2012-01-28 (×9): 40 mg via SUBCUTANEOUS
  Filled 2012-01-20 (×9): qty 0.4

## 2012-01-20 MED ORDER — ONDANSETRON HCL 4 MG/2ML IJ SOLN: 4.0000 mg | Freq: Four times a day (QID) | INTRAMUSCULAR | Status: DC | PRN

## 2012-01-20 MED ORDER — SODIUM CHLORIDE 0.9 % IJ SOLN
3.0000 mL | Freq: Two times a day (BID) | INTRAMUSCULAR | Status: DC
  Administered 2012-01-20 (×2): 3 mL via INTRAVENOUS

## 2012-01-20 MED ORDER — TORSEMIDE 20 MG PO TABS
20.0000 mg | ORAL_TABLET | Freq: Two times a day (BID) | ORAL | Status: DC
  Administered 2012-01-20 (×2): 20 mg via ORAL
  Filled 2012-01-20 (×4): qty 1

## 2012-01-20 MED ORDER — LEVOFLOXACIN IN D5W 500 MG/100ML IV SOLN
500.0000 mg | INTRAVENOUS | Status: DC
  Administered 2012-01-20 – 2012-01-24 (×5): 500 mg via INTRAVENOUS
  Filled 2012-01-20 (×5): qty 100

## 2012-01-20 NOTE — Progress Notes (Signed)
Patient ID: [name], female   DOB: [birth year], 51 y.o.   MRN: [id number]  Subjective: No events overnight. Patient denies chest pain, shortness of breath, abdominal pain.   Objective:  Vital signs in last 24 hours:  Filed Vitals:   01/20/12 0000 01/20/12 0119 01/20/12 0253 01/20/12 0701  BP: 157/77 155/69  142/63  Pulse: 81 85  87  Temp:  98.6 F (37 C)  97.6 F (36.4 C)  TempSrc:  Oral    Resp: 21 22  24   Height:   5\' 7"  (1.702 m)   Weight:  179.9 kg (396 lb 9.7 oz)    SpO2: 99% 99%  96%    Intake/Output from previous day:   Intake/Output Summary (Last 24 hours) at 01/20/12 1612 Last data filed at 01/20/12 0700  Gross per 24 hour  Intake    240 ml  Output   2900 ml  Net  -2660 ml    Physical Exam: General: Alert, awake, oriented x3, in no acute distress. Morbidly obese HEENT: No bruits, no goiter. Moist mucous membranes, no scleral icterus, no conjunctival pallor. Heart: Regular rate and rhythm, S1/S2 +, very distant heart sounds due to morbid obesity, no murmurs, rubs, gallops. Lungs: Clear to auscultation bilaterally with decreased sounds at bases. No wheezing, no rhonchi, no rales.  Abdomen: Soft, nontender, nondistended, positive bowel sounds. Extremities: No clubbing or cyanosis, bilateral pitting edema,  positive pedal pulses. Neuro: Grossly nonfocal.  Lab Results:  Basic Metabolic Panel:    Component Value Date/Time   NA 136 01/19/2012 2008   K 5.4* 01/19/2012 2008   CL 105 01/19/2012 2008   CO2 19 01/19/2012 2008   BUN 44* 01/19/2012 2008   CREATININE 2.36* 01/19/2012 2008   CREATININE 1.58* 07/26/2011 1353   GLUCOSE 115* 01/19/2012 2008   CALCIUM 8.8 01/19/2012 2008   CBC:    Component Value Date/Time   WBC 17.7* 01/19/2012 2008   WBC 17.8* 06/01/2008 1547   HGB 8.7* 01/19/2012 2008   HGB 10.1* 06/01/2008 1547   HCT 31.5* 01/19/2012 2008   HCT 33.0* 06/01/2008 1547   PLT 423* 01/19/2012 2008   PLT 452* 06/01/2008 1547   MCV 73.3* 01/19/2012 2008   MCV  73.2* 06/01/2008 1547   NEUTROABS 14.9* 01/19/2012 2008   NEUTROABS 15.6* 06/01/2008 1547   LYMPHSABS 1.4 01/19/2012 2008   LYMPHSABS 1.1 06/01/2008 1547   MONOABS 0.7 01/19/2012 2008   MONOABS 0.6 06/01/2008 1547   EOSABS 0.7 01/19/2012 2008   EOSABS 0.5 06/01/2008 1547   BASOSABS 0.0 01/19/2012 2008   BASOSABS 0.0 06/01/2008 1547      Lab 01/19/12 2008  WBC 17.7*  HGB 8.7*  HCT 31.5*  PLT 423*  MCV 73.3*  MCH 20.2*  MCHC 27.6*  RDW 20.3*  LYMPHSABS 1.4  MONOABS 0.7  EOSABS 0.7  BASOSABS 0.0  BANDABS --    Lab 01/19/12 2008  NA 136  K 5.4*  CL 105  CO2 19  GLUCOSE 115*  BUN 44*  CREATININE 2.36*  CALCIUM 8.8  MG --   No results found for this basename: INR:5,PROTIME:5 in the last 168 hours Cardiac markers:  Lab 01/20/12 1035 01/20/12 0139 01/19/12 2150  CKMB 2.7 2.9 --  TROPONINI <0.30 <0.30 <0.30  MYOGLOBIN -- -- --   No components found with this basename: POCBNP:3 No results found for this or any previous visit (from the past 240 hour(s)).  Studies/Results: Dg Chest Port 1 View  01/19/2012  *RADIOLOGY REPORT*  Clinical Data: [place] earlier today.  History of hypertension, diabetes, more obesity, and CHF.  PORTABLE CHEST - 1 VIEW 01/19/2012 1740 hours:  Comparison: Two-view chest x-ray 08/16/2011 [facility] in 12/20/2009 [facility].  Findings: Patient rotated to the right.  Underexposed image due to portable technique.  Apparent right jugular central venous catheter which can be followed to the upper SVC, though the tip is not visible.  Cardiac silhouette enlarged but stable.  Pulmonary venous hypertension.  Apparent hazy opacity over the left lung and the right mid lung is felt to be artifactual and related to the rotation.  IMPRESSION: Suboptimal examination due to patient rotation and portable technique.  Stable cardiomegaly.  Pulmonary venous hypertension.  Repeat imaging on the fixed radiographic unit would be suggested when the patient is clinically  able.  Original Report Authenticated By: Deniece Portela, M.D.    Medications: Scheduled Meds:   . aspirin EC  325 mg Oral Daily  . carvedilol  3.125 mg Oral BID WC  . DULoxetine  60 mg Oral Daily  . enoxaparin  40 mg Subcutaneous Daily  . ferrous sulfate  325 mg Oral BID WC  . folic acid  1 mg Oral Daily  . furosemide  80 mg Intravenous Once  . insulin aspart  0-5 Units Subcutaneous QHS  . insulin aspart  0-9 Units Subcutaneous TID WC  . insulin aspart protamine-insulin aspart  20 Units Subcutaneous Q supper  . insulin aspart protamine-insulin aspart  35 Units Subcutaneous Q breakfast  . levofloxacin (LEVAQUIN) IV  500 mg Intravenous Q24H  . levothyroxine  25 mcg Oral Q breakfast  . nitroGLYCERIN  1 inch Topical Once  . simvastatin  20 mg Oral q1800  . sodium chloride  3 mL Intravenous Q12H  . torsemide  20 mg Oral BID  . ascorbic acid  500 mg Oral BID  . zinc sulfate  220 mg Oral Daily  . DISCONTD: levothyroxine  50 mcg Oral QAC breakfast  . DISCONTD: torsemide  20 mg Oral BID   Continuous Infusions:  PRN Meds:.sodium chloride, acetaminophen, ondansetron (ZOFRAN) IV, oxyCODONE-acetaminophen, sodium chloride  Assessment/Plan:  Principal Problem:  *Systolic congestive heart failure - slight clinical improvement - continue Lasix IV - BMP in AM - daily weights and I's and O's  Active Problems:  SOB (shortness of breath) - secondary to decompensated CHF in the setting of morbid obesity, hypoventilation syndrom - now maintaining saturations > 92% - monitor vitals per floor protocol   Leukocytosis - unclear etiology - no fever recorded and pt denies fevers - follow up on UA, urine cultures - check PCT, CBC in AM   HTN (hypertension) - remains stable   DM type 2 (diabetes mellitus, type 2) - check A1C   Iron deficiency anemia - Hg remains stable and at pt's baseline   Hyperkalemia - provide kayexalate - BMP in AM   Chronic kidney disease (CKD), stage III  (moderate) - creatinine trending down - BMP in AM   EDUCATION - test results and diagnostic studies were discussed with patient  - patient verbalized the understanding - questions were answered at the bedside and contact information was provided for additional questions or concerns   LOS: 1 day   MAGICK-Merlin Golden 01/20/2012, 4:12 PM  [facility] Pager: [phone]

## 2012-01-20 NOTE — Progress Notes (Signed)
Admitted pt to rm [id number] from ED via stretcher, alert and oriented, c/o SOB and fall. Pt oriented to room, call bell placed within reach. Admission assessment done, SR on heart monitor. Pt noticed to have drainage from her perineum and buttocks area attempted to assess but pt refused, will not let the RN touch her perineum and stated that " it hurts when it is touch", pt had I&D done last March 2012 to her perineal area and buttocks secondary to severe hidradenitis., pt just want to rest and sleep. Will notify MD.   Danley Danker Vitals:   01/20/12 0119  BP: 155/69  Pulse: 85  Temp: 98.6 F (37 C)  Resp: [address], [place]

## 2012-01-20 NOTE — H&P (Addendum)
DATE OF ADMISSION:  01/20/2012  PCP:   [name], MD, MD  CARDS:  Sadie Haber) Dr. Golden Hurter    Chief Complaint: SOB   HPI: [name] is an 51 y.o. female who presents to the ED with complaints of worsening dyspnea and edema over the past month.  She describes having orthopnea and dsypnea on exertion.  She describes the edema as building up from her feet and progressing upward into her chest.  She denies having any chest pain or pressure.  She does report coughing up thick white sputum.  She denies any fevers or chills.    Past Medical History  Diagnosis Date  . Systolic congestive heart failure   . SOB (shortness of breath)   . HTN (hypertension)   . DM type 2 (diabetes mellitus, type 2)   . Chronic kidney disease (CKD), stage III (moderate)   . Iron deficiency anemia   . Morbid obesity   . Hyperkalemia        Hypothyroid      Hyperlipidemia      Hydradenitis          Past Surgical History  Procedure Date  . Portacath placement     Medications:  HOME MEDS: Prior to Admission medications   Medication Sig Start Date End Date Taking? Authorizing Provider  ascorbic acid (VITAMIN C) 500 MG tablet Take 500 mg by mouth 2 (two) times daily.     Yes Historical Provider, MD  carvedilol (COREG) 3.125 MG tablet Take 3.125 mg by mouth 2 (two) times daily with a meal. Take one tab by mouth with food twice a day    Yes Historical Provider, MD  DULoxetine (CYMBALTA) 60 MG capsule Take 60 mg by mouth daily.     Yes Historical Provider, MD  ferrous sulfate 325 (65 FE) MG tablet Take 325 mg by mouth 2 (two) times daily.     Yes Historical Provider, MD  folic acid (FOLVITE) 1 MG tablet Take 1 mg by mouth daily.   Yes Historical Provider, MD  furosemide (LASIX) 40 MG tablet 40 mg. Take 2 tablets twice a day    Yes Historical Provider, MD  HYDROcodone-acetaminophen (VICODIN) 5-500 MG per tablet Take 1-2 tablets by mouth every 6 hours for pain    Yes Historical Provider, MD  insulin  lispro protamine-insulin lispro (HUMALOG 75/25) (75-25) 100 UNIT/ML SUSP Give 35 units subq every morning and 20 units sub q every PM    Yes Historical Provider, MD  levothyroxine (SYNTHROID, LEVOTHROID) 25 MCG tablet Take 25 mcg by mouth daily.   Yes Historical Provider, MD  levothyroxine (SYNTHROID, LEVOTHROID) 50 MCG tablet Take 50 mcg by mouth. Each morning on an empty stomach 30 before meal    Yes Historical Provider, MD  magnesium oxide (MAG-OX) 400 MG tablet Take 400 mg by mouth 2 (two) times daily.    Yes Historical Provider, MD  oxyCODONE-acetaminophen (PERCOCET) 5-325 MG per tablet Take 1 tablet by mouth every 6 (six) hours as needed. For pain   Yes Historical Provider, MD  pravastatin (PRAVACHOL) 40 MG tablet Take 40 mg by mouth daily.     Yes Historical Provider, MD  spironolactone (ALDACTONE) 25 MG tablet Take 25 mg by mouth daily.    Yes Historical Provider, MD  zinc sulfate 220 MG capsule Take 220 mg by mouth daily.   Yes Historical Provider, MD    Allergies:  Allergies  Allergen Reactions  . Augmentin Diarrhea  . Rosiglitazone Maleate  REACTION: swelling  . Amoxicillin Rash    Social History:  Married  reports that she has never smoked. She does not have any smokeless tobacco history on file. She reports that she does not drink alcohol or use illicit drugs.  Family History: Family History  Problem Relation Age of Onset  . Coronary artery disease Mother   . Coronary artery disease Father   . Hypertension Mother   . Hypertension Father   . Diabetes type II Mother   . Cancer Maternal Grandfather     ? Type    Review of Systems:  The patient denies anorexia, fever, weight loss,, vision loss, decreased hearing, hoarseness, chest pain, syncope, dyspnea on exertion, peripheral edema, balance deficits, hemoptysis, abdominal pain, melena, hematochezia, severe indigestion/heartburn, hematuria, incontinence, genital sores, muscle weakness, suspicious skin lesions, transient  blindness, difficulty walking, depression, unusual weight change, abnormal bleeding, enlarged lymph nodes, angioedema, and breast masses.   Physical Exam:  GEN:  Pleasant 51 year old Morbidly Obese African American Female examined  and in no acute distress; cooperative with exam Filed Vitals:   01/19/12 2200 01/19/12 2255 01/19/12 2300 01/20/12 0000  BP: 168/71 168/71 147/61 157/77  Pulse: 82 82 84 81  Temp:  98.9 F (37.2 C)    TempSrc:  Oral    Resp: 16 19 37 21  SpO2: 97% 98% 94% 99%   Blood pressure 157/77, pulse 81, temperature 98.9 F (37.2 C), temperature source Oral, resp. rate 21, SpO2 99.00%. PSYCH: She is alert and oriented x4; does not appear anxious does not appear depressed; affect is normal HEENT: Normocephalic and Atraumatic, Mucous membranes pink; PERRLA; EOM intact; Fundi:  Benign;  No scleral icterus, Nares: Patent, Oropharynx: Clear, Edentulous or Fair Dentition, Neck:  FROM, no cervical lymphadenopathy nor thyromegaly or carotid bruit; no JVD; Breasts:: Not examined CHEST WALL: No tenderness CHEST: Normal respiration, clear to auscultation bilaterally HEART: Regular rate and rhythm; no murmurs rubs or gallops BACK: No kyphosis or scoliosis; no CVA tenderness ABDOMEN: Positive Bowel Sounds, Scaphoid, Obese, soft non-tender; no masses, no organomegaly,+pannus. Rectal Exam: Not done EXTREMITIES: No cyanosis, clubbing, but 2+ BLE edema; no ulcerations. Genitalia: not examined PULSES: 2+ and symmetric SKIN: Normal hydration, diffuse discrete scarred papilar eruptions on both Upper Exts, and Trunk.   CNS: Cranial nerves 2-12 grossly intact no focal neurologic deficit   Labs & Imaging Results for orders placed during the hospital encounter of 01/19/12 (from the past 48 hour(s))  URINALYSIS, ROUTINE W REFLEX MICROSCOPIC     Status: Abnormal   Collection Time   01/19/12  6:56 PM      Component Value Range Comment   Color, Urine YELLOW  YELLOW     APPearance CLEAR   CLEAR     Specific Gravity, Urine 1.010  1.005 - 1.030     pH 5.0  5.0 - 8.0     Glucose, UA NEGATIVE  NEGATIVE (mg/dL)    Hgb urine dipstick MODERATE (*) NEGATIVE     Bilirubin Urine NEGATIVE  NEGATIVE     Ketones, ur NEGATIVE  NEGATIVE (mg/dL)    Protein, ur 30 (*) NEGATIVE (mg/dL)    Urobilinogen, UA 0.2  0.0 - 1.0 (mg/dL)    Nitrite NEGATIVE  NEGATIVE     Leukocytes, UA NEGATIVE  NEGATIVE    URINE MICROSCOPIC-ADD ON     Status: Abnormal   Collection Time   01/19/12  6:56 PM      Component Value Range Comment   Squamous Epithelial /  LPF RARE  RARE     WBC, UA 0-2  <3 (WBC/hpf)    RBC / HPF 7-10  <3 (RBC/hpf)    Casts HYALINE CASTS (*) NEGATIVE     Urine-Other AMORPHOUS URATES/PHOSPHATES     CBC     Status: Abnormal   Collection Time   01/19/12  8:08 PM      Component Value Range Comment   WBC 17.7 (*) 4.0 - 10.5 (K/uL)    RBC 4.30  3.87 - 5.11 (MIL/uL)    Hemoglobin 8.7 (*) 12.0 - 15.0 (g/dL)    HCT 31.5 (*) 36.0 - 46.0 (%)    MCV 73.3 (*) 78.0 - 100.0 (fL)    MCH 20.2 (*) 26.0 - 34.0 (pg)    MCHC 27.6 (*) 30.0 - 36.0 (g/dL)    RDW 20.3 (*) 11.5 - 15.5 (%)    Platelets 423 (*) 150 - 400 (K/uL)   DIFFERENTIAL     Status: Abnormal   Collection Time   01/19/12  8:08 PM      Component Value Range Comment   Neutrophils Relative 84 (*) 43 - 77 (%)    Lymphocytes Relative 8 (*) 12 - 46 (%)    Monocytes Relative 4  3 - 12 (%)    Eosinophils Relative 4  0 - 5 (%)    Basophils Relative 0  0 - 1 (%)    Neutro Abs 14.9 (*) 1.7 - 7.7 (K/uL)    Lymphs Abs 1.4  0.7 - 4.0 (K/uL)    Monocytes Absolute 0.7  0.1 - 1.0 (K/uL)    Eosinophils Absolute 0.7  0.0 - 0.7 (K/uL)    Basophils Absolute 0.0  0.0 - 0.1 (K/uL)    RBC Morphology POLYCHROMASIA PRESENT      Smear Review LARGE PLATELETS PRESENT     COMPREHENSIVE METABOLIC PANEL     Status: Abnormal   Collection Time   01/19/12  8:08 PM      Component Value Range Comment   Sodium 136  135 - 145 (mEq/L)    Potassium 5.4 (*) 3.5 - 5.1  (mEq/L)    Chloride 105  96 - 112 (mEq/L)    CO2 19  19 - 32 (mEq/L)    Glucose, Bld 115 (*) 70 - 99 (mg/dL)    BUN 44 (*) 6 - 23 (mg/dL)    Creatinine, Ser 2.36 (*) 0.50 - 1.10 (mg/dL)    Calcium 8.8  8.4 - 10.5 (mg/dL)    Total Protein 9.4 (*) 6.0 - 8.3 (g/dL)    Albumin 2.4 (*) 3.5 - 5.2 (g/dL)    AST 9  0 - 37 (U/L)    ALT <5  0 - 35 (U/L)    Alkaline Phosphatase 153 (*) 39 - 117 (U/L)    Total Bilirubin 0.4  0.3 - 1.2 (mg/dL)    GFR calc non Af Amer 23 (*) >90 (mL/min)    GFR calc Af Amer 26 (*) >90 (mL/min)   PRO B NATRIURETIC PEPTIDE     Status: Abnormal   Collection Time   01/19/12  8:09 PM      Component Value Range Comment   Pro B Natriuretic peptide (BNP) 12043.0 (*) 0 - 125 (pg/mL)   TROPONIN I     Status: Normal   Collection Time   01/19/12  9:50 PM      Component Value Range Comment   Troponin I <0.30  <0.30 (ng/mL)    Dg Chest Port 1  View  01/19/2012  *RADIOLOGY REPORT*  Clinical Data: [place] earlier today.  History of hypertension, diabetes, more obesity, and CHF.  PORTABLE CHEST - 1 VIEW 01/19/2012 1740 hours:  Comparison: Two-view chest x-ray 08/16/2011 [facility] in 12/20/2009 [facility].  Findings: Patient rotated to the right.  Underexposed image due to portable technique.  Apparent right jugular central venous catheter which can be followed to the upper SVC, though the tip is not visible.  Cardiac silhouette enlarged but stable.  Pulmonary venous hypertension.  Apparent hazy opacity over the left lung and the right mid lung is felt to be artifactual and related to the rotation.  IMPRESSION: Suboptimal examination due to patient rotation and portable technique.  Stable cardiomegaly.  Pulmonary venous hypertension.  Repeat imaging on the fixed radiographic unit would be suggested when the patient is clinically able.  Original Report Authenticated By: Deniece Portela, M.D.      Assessment: Present on Admission:  .Systolic congestive heart  failure .SOB (shortness of breath) .HTN (hypertension) .DM type 2 (diabetes mellitus, type 2) .Chronic kidney disease (CKD), stage III (moderate) .Iron deficiency anemia .Hyperkalemia .Morbid obesity  Leukocytosis  Plan:     Telemetry Monitoring CHF Protocol ( For Acute on Chronic Systolic CHF) IV Torsemide BID X 4 doses Hold K+, and D/C spironolactone for now.  2D ECHO ordered Cardiac Enzymes Home Medications Reconciled SSI coverage PRN. Monitor Lytes and WBCs DVT prophylaxis Other plans as per orders.    CODE STATUS:      FULL CODE         Terryann Verbeek C 01/20/2012, 12:44 AM

## 2012-01-20 NOTE — Progress Notes (Signed)
ANTIBIOTIC CONSULT NOTE - INITIAL  Pharmacy Consult for vancomycin Indication: skin abscess  Allergies  Allergen Reactions  . Augmentin Diarrhea  . Rosiglitazone Maleate     REACTION: swelling  . Amoxicillin Rash    Patient Measurements: Height: 5\' 7"  (170.2 cm) Weight: 396 lb 9.7 oz (179.9 kg) (b scale) IBW/kg (Calculated) : 61.6   Vital Signs: Temp: 98 F (36.7 C) (02/23 1400) Temp src: Oral (02/23 1400) BP: 138/55 mmHg (02/23 1400) Pulse Rate: 77  (02/23 1400) Intake/Output from previous day: 02/22 0701 - 02/23 0700 In: 240 [P.O.:240] Out: 2900 [Urine:2900]  Labs:  [facility] 01/19/12 2008  WBC 17.7*  HGB 8.7*  PLT 423*  LABCREA --  CREATININE 2.36*   Estimated Creatinine Clearance: 49 ml/min (by C-G formula based on Cr of 2.36). No results found for this basename: VANCOTROUGH:2,VANCOPEAK:2,VANCORANDOM:2,GENTTROUGH:2,GENTPEAK:2,GENTRANDOM:2,TOBRATROUGH:2,TOBRAPEAK:2,TOBRARND:2,AMIKACINPEAK:2,AMIKACINTROU:2,AMIKACIN:2, in the last 72 hours   Microbiology: No results found for this or any previous visit (from the past 720 hour(s)).  Medical History: Past Medical History  Diagnosis Date  . Systolic congestive heart failure   . SOB (shortness of breath)   . HTN (hypertension)   . DM type 2 (diabetes mellitus, type 2)   . Chronic kidney disease (CKD), stage III (moderate)   . Iron deficiency anemia   . Morbid obesity   . Hyperkalemia   . Hypothyroidism   . CHF (congestive heart failure)     Medications:  Prescriptions prior to admission  Medication Sig Dispense Refill  . ascorbic acid (VITAMIN C) 500 MG tablet Take 500 mg by mouth 2 (two) times daily.        . carvedilol (COREG) 3.125 MG tablet Take 3.125 mg by mouth 2 (two) times daily with a meal. Take one tab by mouth with food twice a day       . DULoxetine (CYMBALTA) 60 MG capsule Take 60 mg by mouth daily.        . ferrous sulfate 325 (65 FE) MG tablet Take 325 mg by mouth 2 (two) times daily.          . folic acid (FOLVITE) 1 MG tablet Take 1 mg by mouth daily.      . furosemide (LASIX) 40 MG tablet 40 mg. Take 2 tablets twice a day       . HYDROcodone-acetaminophen (VICODIN) 5-500 MG per tablet Take 1-2 tablets by mouth every 6 hours for pain       . insulin lispro protamine-insulin lispro (HUMALOG 75/25) (75-25) 100 UNIT/ML SUSP Give 35 units subq every morning and 20 units sub q every PM       . levothyroxine (SYNTHROID, LEVOTHROID) 25 MCG tablet Take 25 mcg by mouth daily.      Marland Kitchen levothyroxine (SYNTHROID, LEVOTHROID) 50 MCG tablet Take 50 mcg by mouth. Each morning on an empty stomach 30 before meal       . magnesium oxide (MAG-OX) 400 MG tablet Take 400 mg by mouth 2 (two) times daily.       Marland Kitchen oxyCODONE-acetaminophen (PERCOCET) 5-325 MG per tablet Take 1 tablet by mouth every 6 (six) hours as needed. For pain      . pravastatin (PRAVACHOL) 40 MG tablet Take 40 mg by mouth daily.        Marland Kitchen spironolactone (ALDACTONE) 25 MG tablet Take 25 mg by mouth daily.       Marland Kitchen zinc sulfate 220 MG capsule Take 220 mg by mouth daily.       Assessment: 62 yof admitted  with SOB and s/p fall. Started on empiric levaquin previously and now adding vancomycin. Pt is morbidly obese so achieving therapeutic levels may be challenging. Pt is afebrile and WBC was 17.7 on 2/22.   Goal of Therapy:  Vancomycin trough level 10-15 mcg/ml  Plan:  Measure antibiotic drug levels at steady state Follow up culture results Vancomycin 2gm IV Q24H  Nevah Dalal, Rande Lawman 01/20/2012,8:42 PM

## 2012-01-21 ENCOUNTER — Inpatient Hospital Stay (HOSPITAL_COMMUNITY): Payer: BC Managed Care – PPO

## 2012-01-21 LAB — GLUCOSE, CAPILLARY
Glucose-Capillary: 89 mg/dL (ref 70–99)
Glucose-Capillary: 122 mg/dL — ABNORMAL HIGH (ref 70–99)
Glucose-Capillary: 100 mg/dL — ABNORMAL HIGH (ref 70–99)

## 2012-01-21 LAB — BASIC METABOLIC PANEL
BUN: 45 mg/dL — ABNORMAL HIGH (ref 6–23)
GFR calc non Af Amer: 24 mL/min — ABNORMAL LOW (ref >90)
Glucose, Bld: 78 mg/dL (ref 70–99)
Potassium: 5.2 mEq/L — ABNORMAL HIGH (ref 3.5–5.1)

## 2012-01-21 LAB — URINE CULTURE
Colony Count: NO GROWTH
Culture: NO GROWTH

## 2012-01-21 LAB — CBC
Hemoglobin: 7.8 g/dL — ABNORMAL LOW (ref 12.0–15.0)
MCH: 20.1 pg — ABNORMAL LOW (ref 26.0–34.0)
RBC: 3.88 MIL/uL (ref 3.87–5.11)

## 2012-01-21 LAB — HEMOGLOBIN A1C: Mean Plasma Glucose: 197 mg/dL — ABNORMAL HIGH (ref <117)

## 2012-01-21 MED ORDER — SODIUM POLYSTYRENE SULFONATE 15 GM/60ML PO SUSP
30.0000 g | Freq: Once | ORAL | Status: DC
  Filled 2012-01-21: qty 120

## 2012-01-21 NOTE — Progress Notes (Signed)
PT Cancellation Note  Treatment cancelled today due to patient's refusal to participate. Pt requested waiting until tomorrow because the fluid in her feet are hurting and making it difficult to move. Will evaluation tomorrow pending availability. 01/21/2012 Ambrose Finland DPT PAGER: [phone] OFFICE: [phone]    Ambrose Finland 01/21/2012, 3:22 PM

## 2012-01-21 NOTE — Progress Notes (Signed)
Patient ID: [name], female   DOB: 02/14/1961, 51 y.o.   MRN: [id number]  Subjective: No events overnight. Patient denies chest pain, shortness of breath, abdominal pain.  Objective:  Vital signs in last 24 hours:  Filed Vitals:   01/20/12 1400 01/20/12 2208 01/21/12 0619 01/21/12 1400  BP: 138/55 127/76 122/77 114/54  Pulse: 77 76 74 86  Temp: 98 F (36.7 C) 98.7 F (37.1 C) 98 F (36.7 C) 97.5 F (36.4 C)  TempSrc: Oral Oral Oral Oral  Resp: 18 20 19 20   Height:      Weight:   176.6 kg (389 lb 5.3 oz)   SpO2: 98% 96% 100% 94%    Intake/Output from previous day:   Intake/Output Summary (Last 24 hours) at 01/21/12 2110 Last data filed at 01/21/12 1847  Gross per 24 hour  Intake   1270 ml  Output   3500 ml  Net  -2230 ml    Physical Exam: General: Alert, awake, oriented x3, in no acute distress. Morbidly obese HEENT: No bruits, no goiter. Moist mucous membranes, no scleral icterus, no conjunctival pallor. Heart: Regular rate and rhythm, distant heart sounds, S1/S2 +, no murmurs, rubs, gallops. Lungs: Clear to auscultation bilaterally with poor breath sounds due to body habitus. No wheezing, no rhonchi, no rales.  Abdomen: Soft, nontender, nondistended, positive bowel sounds. Obese abdomen Extremities: No clubbing or cyanosis, bilateral pitting edema,  positive pedal pulses. Neuro: Grossly nonfocal.  Lab Results:  Basic Metabolic Panel:    Component Value Date/Time   NA 141 01/21/2012 0530   K 5.2* 01/21/2012 0530   CL 113* 01/21/2012 0530   CO2 19 01/21/2012 0530   BUN 45* 01/21/2012 0530   CREATININE 2.24* 01/21/2012 0530   CREATININE 1.58* 07/26/2011 1353   GLUCOSE 78 01/21/2012 0530   CALCIUM 8.6 01/21/2012 0530   CBC:    Component Value Date/Time   WBC 16.1* 01/21/2012 0530   WBC 17.8* 06/01/2008 1547   HGB 7.8* 01/21/2012 0530   HGB 10.1* 06/01/2008 1547   HCT 28.2* 01/21/2012 0530   HCT 33.0* 06/01/2008 1547   PLT 379 01/21/2012 0530   PLT 452* 06/01/2008 1547    MCV 72.7* 01/21/2012 0530   MCV 73.2* 06/01/2008 1547   NEUTROABS 14.9* 01/19/2012 2008   NEUTROABS 15.6* 06/01/2008 1547   LYMPHSABS 1.4 01/19/2012 2008   LYMPHSABS 1.1 06/01/2008 1547   MONOABS 0.7 01/19/2012 2008   MONOABS 0.6 06/01/2008 1547   EOSABS 0.7 01/19/2012 2008   EOSABS 0.5 06/01/2008 1547   BASOSABS 0.0 01/19/2012 2008   BASOSABS 0.0 06/01/2008 1547      Lab 01/21/12 0530 01/19/12 2008  WBC 16.1* 17.7*  HGB 7.8* 8.7*  HCT 28.2* 31.5*  PLT 379 423*  MCV 72.7* 73.3*  MCH 20.1* 20.2*  MCHC 27.7* 27.6*  RDW 20.4* 20.3*  LYMPHSABS -- 1.4  MONOABS -- 0.7  EOSABS -- 0.7  BASOSABS -- 0.0  BANDABS -- --    Lab 01/21/12 0530 01/19/12 2008  NA 141 136  K 5.2* 5.4*  CL 113* 105  CO2 19 19  GLUCOSE 78 115*  BUN 45* 44*  CREATININE 2.24* 2.36*  CALCIUM 8.6 8.8  MG -- --   No results found for this basename: INR:5,PROTIME:5 in the last 168 hours Cardiac markers:  Lab 01/20/12 1633 01/20/12 1035 01/20/12 0139  CKMB 2.9 2.7 2.9  TROPONINI <0.30 <0.30 <0.30  MYOGLOBIN -- -- --   No components found with this basename: POCBNP:3  Recent Results (from the past 240 hour(s))  URINE CULTURE     Status: Normal   Collection Time   01/19/12  6:56 PM      Component Value Range Status Comment   Specimen Description URINE, CATHETERIZED   Final    Special Requests NONE   Final    Culture  Setup Time [id number]   Final    Colony Count NO GROWTH   Final    Culture NO GROWTH   Final    Report Status 01/21/2012 FINAL   Final   URINE CULTURE     Status: Normal (Preliminary result)   Collection Time   01/20/12  3:48 PM      Component Value Range Status Comment   Specimen Description URINE, CATHETERIZED   Final    Special Requests NONE   Final    Culture  Setup Time [id number]   Final    Colony Count PENDING   Incomplete    Culture Culture reincubated for better growth   Final    Report Status PENDING   Incomplete     Studies/Results: Dg Chest 1 View  01/21/2012  *RADIOLOGY  REPORT*  Clinical Data: CHF.  CHEST - 1 VIEW  Comparison: 01/19/2012  Findings: Study is severely limited.  Study was done as a cross- table decubitus view due to the patient's condition and size. There appears to be cardiomegaly.  Lung fields are not visualized well enough to comment on.  IMPRESSION: Severely limited study as above.  Original Report Authenticated By: Raelyn Number, M.D.    Medications: Scheduled Meds:   . aspirin EC  325 mg Oral Daily  . carvedilol  3.125 mg Oral BID WC  . DULoxetine  60 mg Oral Daily  . enoxaparin  40 mg Subcutaneous Daily  . ferrous sulfate  325 mg Oral BID WC  . folic acid  1 mg Oral Daily  . insulin aspart  0-5 Units Subcutaneous QHS  . insulin aspart  0-9 Units Subcutaneous TID WC  . insulin aspart protamine-insulin aspart  20 Units Subcutaneous Q supper  . insulin aspart protamine-insulin aspart  35 Units Subcutaneous Q breakfast  . levofloxacin (LEVAQUIN) IV  500 mg Intravenous Q24H  . levothyroxine  25 mcg Oral Q breakfast  . simvastatin  20 mg Oral q1800  . torsemide  20 mg Oral BID  . vancomycin  2,000 mg Intravenous Q24H  . ascorbic acid  500 mg Oral BID  . zinc sulfate  220 mg Oral Daily   Continuous Infusions:  PRN Meds:.acetaminophen, ondansetron (ZOFRAN) IV, oxyCODONE-acetaminophen  Assessment/Plan:  Principal Problem:  *Systolic congestive heart failure  - slight clinical improvement  - continue Lasix IV  - BMP in AM  - daily weights and I's and O's   Active Problems:  SOB (shortness of breath)  - secondary to decompensated CHF in the setting of morbid obesity, hypoventilation syndrom  - now maintaining saturations > 92%  - monitor vitals per floor protocol   Leukocytosis  - unclear etiology, WBC trending down - no fever recorded and pt denies fevers  - follow up on UA, urine cultures  - check PCT, CBC in AM   HTN (hypertension)  - remains stable   DM type 2 (diabetes mellitus, type 2)  - check A1C   Iron  deficiency anemia  - Hg remains stable and at pt's baseline   Hyperkalemia  - provide kayexalate  - BMP in AM   Chronic kidney disease (CKD), stage  III (moderate)  - creatinine trending down  - BMP in AM   EDUCATION  - test results and diagnostic studies were discussed with patient  - patient verbalized the understanding  - questions were answered at the bedside and contact information was provided for additional questions or concerns    LOS: 2 days   MAGICK-Shyvonne Chastang 01/21/2012, 9:10 PM  [facility] Pager: [phone]

## 2012-01-22 LAB — GLUCOSE, CAPILLARY

## 2012-01-22 LAB — CBC
HCT: 28.1 % — ABNORMAL LOW (ref 36.0–46.0)
Hemoglobin: 7.8 g/dL — ABNORMAL LOW (ref 12.0–15.0)
WBC: 14.2 10*3/uL — ABNORMAL HIGH (ref 4.0–10.5)

## 2012-01-22 LAB — BASIC METABOLIC PANEL
BUN: 44 mg/dL — ABNORMAL HIGH (ref 6–23)
Chloride: 110 mEq/L (ref 96–112)
Glucose, Bld: 90 mg/dL (ref 70–99)
Potassium: 5.1 mEq/L (ref 3.5–5.1)

## 2012-01-22 MED ORDER — FUROSEMIDE 40 MG PO TABS
40.0000 mg | ORAL_TABLET | Freq: Two times a day (BID) | ORAL | Status: DC
  Administered 2012-01-23 – 2012-01-24 (×3): 40 mg via ORAL
  Filled 2012-01-22 (×6): qty 1

## 2012-01-22 NOTE — Progress Notes (Signed)
Utilization Review Completed.[name] T2/25/2013   

## 2012-01-22 NOTE — Progress Notes (Signed)
Pt had an episode of 13 beat run of V-tach, no S/S, BP 110/58, HR 81; K:5.2, pt refused her dose of K-exelate earlier although RN explained the importance of taking med she still refused;  MD aware, pt does have Bmet in AM, will continue to monitor.............Marland KitchenThanks, Arvella Nigh RN

## 2012-01-22 NOTE — Consult Note (Signed)
WOC consult Note Reason for Consult: Wound consult requested for perineal lesions.  Discussed condition with patient.  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  Pt has had this intervention before, and has seen physicians at [facility] for this problem, which she has had over 10 years.  Multiple dry scabbed lesions to body and scars from healed lesions.  Perineal area remains moist, macerated and swollen, but pt states there are no painful lesions which need drainage at this time.  Plan: If any areas have a change in status, please consult CCS. Pt tucks dry gauze between perineal folds to absorb drainage.  Will continue with present plan of care.  Will not plan to follow further unless re-consulted.  [address] [place], MSN, [place]

## 2012-01-22 NOTE — Progress Notes (Signed)
PT Cancellation Note  Treatment cancelled today due to patient's refusal to participate.  The patient did not feel like working with PT/OT this afternoon because her left foot is painful and swollen.  She reports she sat up in the chair earlier today and has walked gingerly around the room.  She would like for PT to track down a walker for her to use to take pressure off of her left foot.    Barbarann Ehlers Karol Liendo, PT, DPT [id number] [place] [address], OTR-L 01/22/2012, 2:54 PM

## 2012-01-22 NOTE — Progress Notes (Signed)
Patient ID: [name], female   DOB: 09-Apr-1961, 51 y.o.   MRN: [id number]  Subjective: No events overnight. Patient denies chest pain, shortness of breath, abdominal pain.   Objective:  Vital signs in last 24 hours:  Filed Vitals:   01/21/12 1400 01/21/12 2213 01/22/12 0214 01/22/12 0550  BP: 114/54 106/69 133/77 138/79  Pulse: 86 95 79 81  Temp: 97.5 F (36.4 C) 97.7 F (36.5 C) 98 F (36.7 C) 98 F (36.7 C)  TempSrc: Oral Oral Oral Oral  Resp: 20 20 19 18   Height:      Weight:    179.1 kg (394 lb 13.5 oz)  SpO2: 94% 99% 97% 96%    Intake/Output from previous day:   Intake/Output Summary (Last 24 hours) at 01/22/12 0715 Last data filed at 01/22/12 0504  Gross per 24 hour  Intake   1320 ml  Output   3350 ml  Net  -2030 ml    Physical Exam: General: Alert, awake, oriented x3, in no acute distress. Sitting in chair and eating HEENT: No bruits, no goiter. Moist mucous membranes, no scleral icterus, no conjunctival pallor. Heart: Regular rate and rhythm, S1/S2 +, no murmurs, rubs, gallops. Lungs: Clear to auscultation bilaterally. No wheezing, no rhonchi, no rales.  Abdomen: Soft, nontender, nondistended, positive bowel sounds. Extremities: No clubbing or cyanosis, no pitting edema,  positive pedal pulses. Neuro: Grossly nonfocal.  Lab Results:  Basic Metabolic Panel:    Component Value Date/Time   NA 138 01/22/2012 0540   K 5.1 01/22/2012 0540   CL 110 01/22/2012 0540   CO2 20 01/22/2012 0540   BUN 44* 01/22/2012 0540   CREATININE 2.12* 01/22/2012 0540   CREATININE 1.58* 07/26/2011 1353   GLUCOSE 90 01/22/2012 0540   CALCIUM 8.5 01/22/2012 0540   CBC:    Component Value Date/Time   WBC 14.2* 01/22/2012 0540   WBC 17.8* 06/01/2008 1547   HGB 7.8* 01/22/2012 0540   HGB 10.1* 06/01/2008 1547   HCT 28.1* 01/22/2012 0540   HCT 33.0* 06/01/2008 1547   PLT 387 01/22/2012 0540   PLT 452* 06/01/2008 1547   MCV 72.4* 01/22/2012 0540   MCV 73.2* 06/01/2008 1547   NEUTROABS 14.9*  01/19/2012 2008   NEUTROABS 15.6* 06/01/2008 1547   LYMPHSABS 1.4 01/19/2012 2008   LYMPHSABS 1.1 06/01/2008 1547   MONOABS 0.7 01/19/2012 2008   MONOABS 0.6 06/01/2008 1547   EOSABS 0.7 01/19/2012 2008   EOSABS 0.5 06/01/2008 1547   BASOSABS 0.0 01/19/2012 2008   BASOSABS 0.0 06/01/2008 1547      Lab 01/22/12 0540 01/21/12 0530 01/19/12 2008  WBC 14.2* 16.1* 17.7*  HGB 7.8* 7.8* 8.7*  HCT 28.1* 28.2* 31.5*  PLT 387 379 423*  MCV 72.4* 72.7* 73.3*  MCH 20.1* 20.1* 20.2*  MCHC 27.8* 27.7* 27.6*  RDW 20.6* 20.4* 20.3*  LYMPHSABS -- -- 1.4  MONOABS -- -- 0.7  EOSABS -- -- 0.7  BASOSABS -- -- 0.0  BANDABS -- -- --    Lab 01/22/12 0540 01/21/12 0530 01/19/12 2008  NA 138 141 136  K 5.1 5.2* 5.4*  CL 110 113* 105  CO2 20 19 19   GLUCOSE 90 78 115*  BUN 44* 45* 44*  CREATININE 2.12* 2.24* 2.36*  CALCIUM 8.5 8.6 8.8  MG -- -- --   No results found for this basename: INR:5,PROTIME:5 in the last 168 hours Cardiac markers:  Lab 01/20/12 1633 01/20/12 1035 01/20/12 0139  CKMB 2.9 2.7 2.9  TROPONINI <  0.30 <0.30 <0.30  MYOGLOBIN -- -- --   No components found with this basename: POCBNP:3 Recent Results (from the past 240 hour(s))  URINE CULTURE     Status: Normal   Collection Time   01/19/12  6:56 PM      Component Value Range Status Comment   Specimen Description URINE, CATHETERIZED   Final    Special Requests NONE   Final    Culture  Setup Time [id number]   Final    Colony Count NO GROWTH   Final    Culture NO GROWTH   Final    Report Status 01/21/2012 FINAL   Final   URINE CULTURE     Status: Normal (Preliminary result)   Collection Time   01/20/12  3:48 PM      Component Value Range Status Comment   Specimen Description URINE, CATHETERIZED   Final    Special Requests NONE   Final    Culture  Setup Time [id number]   Final    Colony Count PENDING   Incomplete    Culture Culture reincubated for better growth   Final    Report Status PENDING   Incomplete   WOUND CULTURE      Status: Normal (Preliminary result)   Collection Time   01/21/12  5:32 PM      Component Value Range Status Comment   Specimen Description WOUND GROIN   Final    Special Requests NONE   Final    Gram Stain     Final    Value: NO WBC SEEN     FEW SQUAMOUS EPITHELIAL CELLS PRESENT     ABUNDANT GRAM VARIABLE ROD     MODERATE GRAM POSITIVE COCCI IN PAIRS   Culture PENDING   Incomplete    Report Status PENDING   Incomplete     Studies/Results: Dg Chest 1 View  01/21/2012  *RADIOLOGY REPORT*  Clinical Data: CHF.  CHEST - 1 VIEW  Comparison: 01/19/2012  Findings: Study is severely limited.  Study was done as a cross- table decubitus view due to the patient's condition and size. There appears to be cardiomegaly.  Lung fields are not visualized well enough to comment on.  IMPRESSION: Severely limited study as above.  Original Report Authenticated By: Raelyn Number, M.D.    Medications: Scheduled Meds:   . aspirin EC  325 mg Oral Daily  . carvedilol  3.125 mg Oral BID WC  . DULoxetine  60 mg Oral Daily  . enoxaparin  40 mg Subcutaneous Daily  . ferrous sulfate  325 mg Oral BID WC  . folic acid  1 mg Oral Daily  . insulin aspart  0-5 Units Subcutaneous QHS  . insulin aspart  0-9 Units Subcutaneous TID WC  . insulin aspart protamine-insulin aspart  20 Units Subcutaneous Q supper  . insulin aspart protamine-insulin aspart  35 Units Subcutaneous Q breakfast  . levofloxacin (LEVAQUIN) IV  500 mg Intravenous Q24H  . levothyroxine  25 mcg Oral Q breakfast  . simvastatin  20 mg Oral q1800  . sodium polystyrene  30 g Oral Once  . torsemide  20 mg Oral BID  . vancomycin  2,000 mg Intravenous Q24H  . ascorbic acid  500 mg Oral BID  . zinc sulfate  220 mg Oral Daily   Continuous Infusions:  PRN Meds:.acetaminophen, ondansetron (ZOFRAN) IV, oxyCODONE-acetaminophen  Assessment/Plan:  Principal Problem:  *Systolic congestive heart failure  - slight clinical improvement  - continue home dose  regimen -  BMP in AM  - daily weights and I's and O's   Active Problems:  SOB (shortness of breath)  - secondary to decompensated CHF in the setting of morbid obesity, hypoventilation syndrom  - now maintaining saturations > 92%  - monitor vitals per floor protocol   Leukocytosis  - unclear etiology, WBC trending down  - no fever recorded and pt denies fevers  - follow up on UA, urine cultures  - check PCT, CBC in AM   HTN (hypertension)  - remains stable   DM type 2 (diabetes mellitus, type 2)  - check A1C   Iron deficiency anemia  - Hg remains stable and at pt's baseline   Hyperkalemia  - provide kayexalate  - BMP in AM   Chronic kidney disease (CKD), stage III (moderate)  - creatinine trending down  - BMP in AM   EDUCATION  - test results and diagnostic studies were discussed with patient  - patient verbalized the understanding  - questions were answered at the bedside and contact information was provided for additional questions or concerns    LOS: 3 days   MAGICK-Menucha Dicesare 01/22/2012, 7:15 AM  [facility] Pager: [phone]

## 2012-01-22 NOTE — Progress Notes (Signed)
PT Cancellation Note  Treatment cancelled today due to patient's refusal to participate.  The patient was sleeping soundly.  Family member stated that she will want to get a bath before working with PT/OT.  Family member suggested that PT/OT check back after lunch.  PT/OT will check back as time allows later today or tomorrow.    Barbarann Ehlers Athalene Kolle, PT, DPT #[phone]/Cathy Lenoard, OTR-L 01/22/2012, 11:31 AM

## 2012-01-23 LAB — BASIC METABOLIC PANEL
BUN: 42 mg/dL — ABNORMAL HIGH (ref 6–23)
CO2: 19 mEq/L (ref 19–32)
Chloride: 108 mEq/L (ref 96–112)
Creatinine, Ser: 1.95 mg/dL — ABNORMAL HIGH (ref 0.50–1.10)

## 2012-01-23 LAB — CBC
HCT: 27.5 % — ABNORMAL LOW (ref 36.0–46.0)
Hemoglobin: 7.7 g/dL — ABNORMAL LOW (ref 12.0–15.0)
MCV: 72.4 fL — ABNORMAL LOW (ref 78.0–100.0)
RDW: 20.7 % — ABNORMAL HIGH (ref 11.5–15.5)
WBC: 13.2 10*3/uL — ABNORMAL HIGH (ref 4.0–10.5)

## 2012-01-23 LAB — GLUCOSE, CAPILLARY
Glucose-Capillary: 118 mg/dL — ABNORMAL HIGH (ref 70–99)
Glucose-Capillary: 146 mg/dL — ABNORMAL HIGH (ref 70–99)

## 2012-01-23 LAB — URINE CULTURE

## 2012-01-23 MED ORDER — VANCOMYCIN HCL 1000 MG IV SOLR
1250.0000 mg | INTRAVENOUS | Status: DC
  Administered 2012-01-23 – 2012-01-29 (×6): 1250 mg via INTRAVENOUS
  Filled 2012-01-23 (×7): qty 1250

## 2012-01-23 NOTE — Progress Notes (Signed)
Pt. Refusing 2 D echo.. States unable to lie on her back.

## 2012-01-23 NOTE — Progress Notes (Addendum)
Patient ID: [name], female   DOB: 09-10-1961, 51 y.o.   MRN: [id number]  Subjective: No events overnight. Patient denies chest pain, shortness of breath, abdominal pain. Pain in groin area improved.  Objective:  Vital signs in last 24 hours:  Filed Vitals:   01/23/12 0345 01/23/12 0626 01/23/12 1500 01/23/12 2124  BP: 118/74 137/81 115/69 96/56  Pulse: 80 84 80 79  Temp: 98.4 F (36.9 C) 97.4 F (36.3 C) 97.6 F (36.4 C) 98.6 F (37 C)  TempSrc: Oral Oral Oral Oral  Resp: 20 20 18 20   Height:      Weight: 182.7 kg (402 lb 12.5 oz)     SpO2: 95% 98% 98% 97%    Intake/Output from previous day:   Intake/Output Summary (Last 24 hours) at 01/23/12 2219 Last data filed at 01/23/12 1900  Gross per 24 hour  Intake   2960 ml  Output   2400 ml  Net    560 ml    Physical Exam: General: Alert, awake, oriented x3, in no acute distress. HEENT: No bruits, no goiter. Moist mucous membranes, no scleral icterus, no conjunctival pallor. Heart: Regular rate and rhythm, S1/S2 +, no murmurs, rubs, gallops. Lungs: Difficult to appreciate breath sounds due to the body habitus. No wheezing, no rhonchi, no rales.  Abdomen: Soft, nontender, nondistended, positive bowel sounds. Extremities: No clubbing or cyanosis, no pitting edema,  positive pedal pulses. Neuro: Grossly nonfocal.  Lab Results:  Basic Metabolic Panel:    Component Value Date/Time   NA 137 01/23/2012 0600   K 5.1 01/23/2012 0600   CL 108 01/23/2012 0600   CO2 19 01/23/2012 0600   BUN 42* 01/23/2012 0600   CREATININE 1.95* 01/23/2012 0600   CREATININE 1.58* 07/26/2011 1353   GLUCOSE 77 01/23/2012 0600   CALCIUM 8.4 01/23/2012 0600   CBC:    Component Value Date/Time   WBC 13.2* 01/23/2012 0600   WBC 17.8* 06/01/2008 1547   HGB 7.7* 01/23/2012 0600   HGB 10.1* 06/01/2008 1547   HCT 27.5* 01/23/2012 0600   HCT 33.0* 06/01/2008 1547   PLT 371 01/23/2012 0600   PLT 452* 06/01/2008 1547   MCV 72.4* 01/23/2012 0600   MCV 73.2*  06/01/2008 1547   NEUTROABS 14.9* 01/19/2012 2008   NEUTROABS 15.6* 06/01/2008 1547   LYMPHSABS 1.4 01/19/2012 2008   LYMPHSABS 1.1 06/01/2008 1547   MONOABS 0.7 01/19/2012 2008   MONOABS 0.6 06/01/2008 1547   EOSABS 0.7 01/19/2012 2008   EOSABS 0.5 06/01/2008 1547   BASOSABS 0.0 01/19/2012 2008   BASOSABS 0.0 06/01/2008 1547      Lab 01/23/12 0600 01/22/12 0540 01/21/12 0530 01/19/12 2008  WBC 13.2* 14.2* 16.1* 17.7*  HGB 7.7* 7.8* 7.8* 8.7*  HCT 27.5* 28.1* 28.2* 31.5*  PLT 371 387 379 423*  MCV 72.4* 72.4* 72.7* 73.3*  MCH 20.3* 20.1* 20.1* 20.2*  MCHC 28.0* 27.8* 27.7* 27.6*  RDW 20.7* 20.6* 20.4* 20.3*  LYMPHSABS -- -- -- 1.4  MONOABS -- -- -- 0.7  EOSABS -- -- -- 0.7  BASOSABS -- -- -- 0.0  BANDABS -- -- -- --    Lab 01/23/12 0600 01/22/12 0540 01/21/12 0530 01/19/12 2008  NA 137 138 141 136  K 5.1 5.1 5.2* 5.4*  CL 108 110 113* 105  CO2 19 20 19 19   GLUCOSE 77 90 78 115*  BUN 42* 44* 45* 44*  CREATININE 1.95* 2.12* 2.24* 2.36*  CALCIUM 8.4 8.5 8.6 8.8  MG -- -- -- --  No results found for this basename: INR:5,PROTIME:5 in the last 168 hours Cardiac markers:  Lab 01/20/12 1633 01/20/12 1035 01/20/12 0139  CKMB 2.9 2.7 2.9  TROPONINI <0.30 <0.30 <0.30  MYOGLOBIN -- -- --   No components found with this basename: POCBNP:3 Recent Results (from the past 240 hour(s))  URINE CULTURE     Status: Normal   Collection Time   01/19/12  6:56 PM      Component Value Range Status Comment   Specimen Description URINE, CATHETERIZED   Final    Special Requests NONE   Final    Culture  Setup Time [id number]   Final    Colony Count NO GROWTH   Final    Culture NO GROWTH   Final    Report Status 01/21/2012 FINAL   Final   URINE CULTURE     Status: Normal   Collection Time   01/20/12  3:48 PM      Component Value Range Status Comment   Specimen Description URINE, CATHETERIZED   Final    Special Requests NONE   Final    Culture  Setup Time [id number]   Final    Colony Count  >=100,000 COLONIES/ML   Final    Culture ESCHERICHIA COLI   Final    Report Status 01/23/2012 FINAL   Final    Organism ID, Bacteria ESCHERICHIA COLI   Final   WOUND CULTURE     Status: Normal   Collection Time   01/21/12  5:32 PM      Component Value Range Status Comment   Specimen Description WOUND GROIN   Final    Special Requests NONE   Final    Gram Stain     Final    Value: NO WBC SEEN     FEW SQUAMOUS EPITHELIAL CELLS PRESENT     ABUNDANT GRAM VARIABLE ROD     MODERATE GRAM POSITIVE COCCI IN PAIRS   Culture FEW STREPTOCOCCUS GROUP G   Final    Report Status 01/22/2012 FINAL   Final     Studies/Results: No results found.  Medications: Scheduled Meds:   . aspirin EC  325 mg Oral Daily  . carvedilol  3.125 mg Oral BID WC  . DULoxetine  60 mg Oral Daily  . enoxaparin  40 mg Subcutaneous Daily  . ferrous sulfate  325 mg Oral BID WC  . folic acid  1 mg Oral Daily  . furosemide  40 mg Oral BID  . insulin aspart  0-5 Units Subcutaneous QHS  . insulin aspart  0-9 Units Subcutaneous TID WC  . insulin aspart protamine-insulin aspart  20 Units Subcutaneous Q supper  . insulin aspart protamine-insulin aspart  35 Units Subcutaneous Q breakfast  . levofloxacin (LEVAQUIN) IV  500 mg Intravenous Q24H  . levothyroxine  25 mcg Oral Q breakfast  . simvastatin  20 mg Oral q1800  . sodium polystyrene  30 g Oral Once  . vancomycin  2,000 mg Intravenous Q24H  . ascorbic acid  500 mg Oral BID  . 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 Tommy Medal recommended continuing Vanc and  Levaquin as pt is doing better on it - pt will need to have an appointment follow up with him - he has recommended placing picc line and to continue Vanc for another month  SOB (shortness of breath)  - secondary to decompensated CHF in the setting of morbid obesity, hypoventilation syndrom  - now maintaining saturations > 92%  - monitor vitals per floor protocol   Leukocytosis  - unclear etiology, WBC trending down  - no fever recorded and pt denies fevers  - follow up on UA, urine cultures  - check PCT, CBC in AM   HTN (hypertension)  - remains stable   DM type 2 (diabetes mellitus, type 2)  - check A1C   Iron deficiency anemia  - Hg remains stable and at pt's baseline   Hyperkalemia  - provide kayexalate  - BMP in AM   Chronic kidney disease (CKD), stage III (moderate)  - creatinine trending down  - BMP in AM   EDUCATION  - test results and diagnostic studies were discussed with patient  - patient verbalized the understanding  - questions were answered at the bedside and contact information was provided for additional questions or concerns      LOS: 4 days   [name] 01/23/2012, 10:19 PM  [facility] Pager: [phone]

## 2012-01-23 NOTE — Progress Notes (Signed)
PT Cancellation Note  Treatment cancelled today due to patient's refusal to participate.  Patient is reporting her left foot is too painful and would like to have some pain medicine prior to attempting PT/OT evaluations.  PT/OT to check back after lunch and notified the RN via call bell for pain meds.  The patient feels her pain is related to the swelling in her left foot and is reluctant to participate until the swelling has gone down.  Therapists educating patient/family on the risks of immobility especially in the hospital.  They verbalized understanding and patient reports she will participate after pain meds have taken effect.       Barbarann Ehlers Mahogani Holohan, PT, DPT #[phone]/Cathy Hollice Espy, OTR-L                                                                01/23/2012, 12:16 PM

## 2012-01-23 NOTE — Progress Notes (Signed)
SPOKE WITH THE PT AND HER  MOTHER AND THEY BOTH STATED THAT THE PT HAS NO NEEDS AT HOME.  PT STATED THAT SHE STILL WORKS AND LIVES AT HOME WITH HER HUSBAND.  WILL F/U ON ANY OTHER NEEDS.   Chauncy Lean 01/23/2012 [phone] OR [phone]

## 2012-01-23 NOTE — Progress Notes (Signed)
ANTIBIOTIC CONSULT NOTE - FOLLOW UP  Pharmacy Consult for Vancomycin  Indication: Skin Abscesses  Allergies  Allergen Reactions  . Augmentin Diarrhea  . Rosiglitazone Maleate     REACTION: swelling  . Amoxicillin Rash    Patient Measurements: Height: 5\' 7"  (170.2 cm) Weight: 402 lb 12.5 oz (182.7 kg) (Bed Scale) IBW/kg (Calculated) : 61.6   Vital Signs: Temp: 98.6 F (37 C) (02/26 2124) Temp src: Oral (02/26 2124) BP: 96/56 mmHg (02/26 2124) Pulse Rate: 79  (02/26 2124) Intake/Output from previous day: 02/25 0701 - 02/26 0700 In: 2160 [P.O.:1560; IV Piggyback:600] Out: 2951 [Urine:2950; Stool:1] Intake/Output from this shift:    Labs:  Basename 01/23/12 0600 01/22/12 0540 01/21/12 0530  WBC 13.2* 14.2* 16.1*  HGB 7.7* 7.8* 7.8*  PLT 371 387 379  LABCREA -- -- --  CREATININE 1.95* 2.12* 2.24*   Estimated Creatinine Clearance: 59.9 ml/min (by C-G formula based on Cr of 1.95).  Basename 01/23/12 2058  VANCOTROUGH 23.1*  VANCOPEAK --  Jake Michaelis --  GENTTROUGH --  GENTPEAK --  GENTRANDOM --  TOBRATROUGH --  TOBRAPEAK --  TOBRARND --  AMIKACINPEAK --  AMIKACINTROU --  AMIKACIN --     Microbiology: Recent Results (from the past 720 hour(s))  URINE CULTURE     Status: Normal   Collection Time   01/19/12  6:56 PM      Component Value Range Status Comment   Specimen Description URINE, CATHETERIZED   Final    Special Requests NONE   Final    Culture  Setup Time [id number]   Final    Colony Count NO GROWTH   Final    Culture NO GROWTH   Final    Report Status 01/21/2012 FINAL   Final   URINE CULTURE     Status: Normal   Collection Time   01/20/12  3:48 PM      Component Value Range Status Comment   Specimen Description URINE, CATHETERIZED   Final    Special Requests NONE   Final    Culture  Setup Time [id number]   Final    Colony Count >=100,000 COLONIES/ML   Final    Culture ESCHERICHIA COLI   Final    Report Status 01/23/2012 FINAL   Final    Organism ID, Bacteria ESCHERICHIA COLI   Final   WOUND CULTURE     Status: Normal   Collection Time   01/21/12  5:32 PM      Component Value Range Status Comment   Specimen Description WOUND GROIN   Final    Special Requests NONE   Final    Gram Stain     Final    Value: NO WBC SEEN     FEW SQUAMOUS EPITHELIAL CELLS PRESENT     ABUNDANT GRAM VARIABLE ROD     MODERATE GRAM POSITIVE COCCI IN PAIRS   Culture FEW STREPTOCOCCUS GROUP G   Final    Report Status 01/22/2012 FINAL   Final     Anti-infectives     Start     Dose/Rate Route Frequency Ordered Stop   01/20/12 2200   vancomycin (VANCOCIN) 2,000 mg in sodium chloride 0.9 % 500 mL IVPB        2,000 mg 250 mL/hr over 120 Minutes Intravenous Every 24 hours 01/20/12 2051     01/20/12 1600   levofloxacin (LEVAQUIN) IVPB 500 mg        500 mg 100 mL/hr over 60 Minutes Intravenous Every 24 hours  01/20/12 1518            Assessment: 49 yof continues on Vancomycin/Levaquin day#4 for skin abscesses. Vancomycin trough supratherapeutic (23.1), level was drawn only 30 minutes earlier, all 3 does were charted.  Goal of Therapy:  Vancomycin trough level 10-15 mcg/ml  Plan:  - Decrease Vancomycin to 1250 mg IV Q24 - f/u LOT for vancomycin  Manley Mason 01/23/2012,10:26 PM

## 2012-01-23 NOTE — Progress Notes (Signed)
Need order to continue with catheter.

## 2012-01-23 NOTE — Evaluation (Addendum)
Occupational Therapy Evaluation Patient Details Name: [name] MRN: [id number] DOB: 15-Apr-1961 Today's Date: 01/23/2012  Problem List:  Patient Active Problem List  Diagnoses  . GOITER, MULTINODULAR  . Morbid obesity  . MOOD DISORDER IN CONDITIONS CLASSIFIED ELSEWHERE  . GERD  . Pyoderma gangrenosum  . HIDRADENITIS SUPPURATIVA  . ERYTHEMA NODOSUM, HX OF  . PSEUDOMONAS INFECTION  . Systolic congestive heart failure  . SOB (shortness of breath)  . HTN (hypertension)  . DM type 2 (diabetes mellitus, type 2)  . Chronic kidney disease (CKD), stage III (moderate)  . Iron deficiency anemia  . Hyperkalemia  . Leukocytosis    Past Medical History:  Past Medical History  Diagnosis Date  . Systolic congestive heart failure   . SOB (shortness of breath)   . HTN (hypertension)   . DM type 2 (diabetes mellitus, type 2)   . Chronic kidney disease (CKD), stage III (moderate)   . Iron deficiency anemia   . Morbid obesity   . Hyperkalemia   . Hypothyroidism   . CHF (congestive heart failure)    Past Surgical History:  Past Surgical History  Procedure Date  . Portacath placement     OT Assessment/Plan/Recommendation OT Assessment Clinical Impression Statement: This 51 yo female admitted with CHF presents to acute OT with fluid overload  and porblems  below thus affecting pt's PLOF at I with UB ADLs and total A with LB ADLs. WIll benefit from acute OT to work on increasing mobility and I with LB ADLs with AE. OT Recommendation/Assessment: Patient will need skilled OT in the acute care venue OT Problem List: Decreased strength;Decreased range of motion;Impaired balance (sitting and/or standing);Decreased knowledge of use of DME or AE;Increased edema;Pain Barriers to Discharge: None OT Therapy Diagnosis : Generalized weakness;Acute pain OT Plan OT Frequency: Min 2X/week OT Treatment/Interventions: Self-care/ADL training;DME and/or AE instruction;Balance training;Patient/family  education OT Recommendation Follow Up Recommendations: Home health OT Equipment Recommended: None recommended by OT Individuals Consulted Consulted and Agree with Results and Recommendations: Patient OT Goals Acute Rehab OT Goals OT Goal Formulation: With patient ADL Goals Pt Will Perform Lower Body Bathing: with set-up;with supervision;Unsupported;Sit to stand from chair;Sit to stand from bed ADL Goal: Lower Body Bathing - Progress: Goal set today Pt Will Perform Lower Body Dressing: with set-up;with supervision;Unsupported;Sit to stand from chair;Sit to stand from bed ADL Goal: Lower Body Dressing - Progress: Goal set today Pt Will Transfer to Toilet: with modified independence;Ambulation;with DME;Comfort height toilet ADL Goal: Toilet Transfer - Progress: Goal set today  OT Evaluation Precautions/Restrictions  Precautions Precautions: Fall Required Braces or Orthoses: No Restrictions Weight Bearing Restrictions: No Other Position/Activity Restrictions: Left foot is painful as well as anytime foley catheter is moved Prior Functioning     ADL ADL Eating/Feeding: Simulated;Independent Where Assessed - Eating/Feeding: Chair Grooming: Simulated;Set up Where Assessed - Grooming: Sitting, chair;Supported Upper Body Bathing: Simulated;Moderate assistance (due to increased fluid throughout body) Where Assessed - Upper Body Bathing: Supported;Sitting, chair Lower Body Bathing: Simulated;+1 Total assistance (due to increased fluid throughout body) Where Assessed - Lower Body Bathing: Supported;Sit to stand from chair Upper Body Dressing: Simulated;Maximal assistance (due to increased fluid throughout body) Where Assessed - Upper Body Dressing: Supported;Sitting, chair Lower Body Dressing: Simulated;+1 Total assistance (due to increased fluid throughout body) Where Assessed - Lower Body Dressing: Supported;Sit to stand from chair Toilet Transfer: Simulated;+2 Total assistance  (pt=80%) Toilet Transfer Details (indicate cue type and reason): chair to end of bed Toilet Transfer Method: Ambulating (wiht RW)  Toileting - Clothing Manipulation: Moderate assistance Where Assessed - Toileting Clothing Manipulation: Standing Toileting - Hygiene: +1 Total assistance (due to excess fluid and genital wounds) Where Assessed - Toileting Hygiene: Standing Tub/Shower Transfer: Not assessed Tub/Shower Transfer Method: Not assessed Equipment Used: Rolling walker Ambulation Related to ADLs: +2 for safety, she really needs +2 physical A for sit to stand Vision/Perception    Cognition   Sensation/Coordination   Extremity Assessment RUE Assessment RUE Assessment: Within Functional Limits LUE Assessment LUE Assessment: Within Functional Limits Mobility    Exercises   End of Session OT - End of Session Equipment Utilized During Treatment:  (RW) Activity Tolerance: Patient limited by pain Patient left: in chair;with call bell in reach;with family/visitor present (mother)  Co-eval with PT Wells Guiles [place])   Almon Register [id number] 01/23/2012, 4:39 PM

## 2012-01-23 NOTE — Evaluation (Signed)
Physical Therapy Evaluation (Co-eval with OT) Patient Details Name: [name] MRN: [id number] DOB: [birth year] Today's Date: 01/23/2012  Problem List:  Patient Active Problem List  Diagnoses  . GOITER, MULTINODULAR  . Morbid obesity  . MOOD DISORDER IN CONDITIONS CLASSIFIED ELSEWHERE  . GERD  . Pyoderma gangrenosum  . HIDRADENITIS SUPPURATIVA  . ERYTHEMA NODOSUM, HX OF  . PSEUDOMONAS INFECTION  . Systolic congestive heart failure  . SOB (shortness of breath)  . HTN (hypertension)  . DM type 2 (diabetes mellitus, type 2)  . Chronic kidney disease (CKD), stage III (moderate)  . Iron deficiency anemia  . Hyperkalemia  . Leukocytosis    Past Medical History:  Past Medical History  Diagnosis Date  . Systolic congestive heart failure   . SOB (shortness of breath)   . HTN (hypertension)   . DM type 2 (diabetes mellitus, type 2)   . Chronic kidney disease (CKD), stage III (moderate)   . Iron deficiency anemia   . Morbid obesity   . Hyperkalemia   . Hypothyroidism   . CHF (congestive heart failure)    Past Surgical History:  Past Surgical History  Procedure Date  . Portacath placement     PT Assessment/Plan/Recommendation PT Assessment Clinical Impression Statement: 51 y.o. female admitted to [facility] for SOB and leg edema L>R.  She presents with decreased mobility, decreased leg strength, increased pain in both genetal area and left foot with WB/gait, decreased balance and decreased activitiy tolerance.  She would benefit from skilled acute PT to maximize her independence, functional mobility and safety so that she may be able to return home safely at discharge with her family's assistance.  Patient prefers to be seen after her bath and certainly after being premedicated.   PT Recommendation/Assessment: Patient will need skilled PT in the acute care venue PT Problem List: Decreased strength;Decreased activity tolerance;Decreased balance;Decreased mobility;Decreased knowledge of  use of DME;Pain Barriers to Discharge: Other (comment) (8 steps to enter, full flight to get up to bedroom) PT Therapy Diagnosis : Difficulty walking;Generalized weakness;Abnormality of gait;Acute pain PT Plan PT Frequency: Min 3X/week PT Treatment/Interventions: DME instruction;Gait training;Stair training;Functional mobility training;Therapeutic activities;Therapeutic exercise;Balance training;Neuromuscular re-education;Patient/family education PT Recommendation Follow Up Recommendations: Home health PT;Supervision/Assistance - 24 hour Equipment Recommended: Rolling walker with 5" wheels (wide) PT Goals  Acute Rehab PT Goals PT Goal Formulation: With patient/family Time For Goal Achievement: 2 weeks Pt will go Supine/Side to Sit: with modified independence;with HOB 0 degrees PT Goal: Supine/Side to Sit - Progress: Goal set today Pt will go Sit to Supine/Side: with modified independence;with HOB 0 degrees PT Goal: Sit to Supine/Side - Progress: Goal set today Pt will go Sit to Stand: with supervision PT Goal: Sit to Stand - Progress: Goal set today Pt will go Stand to Sit: with supervision PT Goal: Stand to Sit - Progress: Goal set today Pt will Transfer Bed to Chair/Chair to Bed: with supervision PT Transfer Goal: Bed to Chair/Chair to Bed - Progress: Goal set today Pt will Ambulate: 51 - 150 feet;with supervision;with rolling walker PT Goal: Ambulate - Progress: Goal set today Pt will Go Up / Down Stairs: with min assist;with rail(s);Flight PT Goal: Up/Down Stairs - Progress: Goal set today  PT Evaluation Precautions/Restrictions  Precautions Precautions: Fall Restrictions Other Position/Activity Restrictions: painful right foot, sensative to movement of foley catheter.   Prior Functioning  Home Living Lives With: Spouse Receives Help From: Family (spouse and mother) Type of Home: House Home Layout: Two level;Full bath on main  level (no bedroom on main level- now an  office) Alternate Level Stairs-Rails: Right;Left;Can reach both Alternate Level Stairs-Number of Steps: 12 Home Access: Stairs to enter Entrance Stairs-Rails: Right;Left;Can reach both Entrance Stairs-Number of Steps: 8 Bathroom Shower/Tub: Walk-in shower;Door Bathroom Toilet: Handicapped height (elevated with vanity beside to pull up ) Home Adaptive Equipment: Hand-held shower hose Additional Comments: patient states that mom helps her with her genital wound care Prior Function Level of Independence: Independent with gait;Independent with transfers;Independent with homemaking with ambulation;Needs assistance with ADLs (needs assist for lower body dressing and genital wound care) Able to Take Stairs?: Yes Driving: Yes Vocation: Full time employment 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   Extremity Assessment RLE Strength RLE Overall Strength: Deficits RLE Overall Strength Comments: grossly 3+/5 per functional assessment.   LLE Strength LLE Overall Strength: Deficits LLE Overall Strength Comments: ankle 2/5, knee/hip functionally tested 3+/5 Mobility (including Balance) Transfers Transfers: Yes Sit to Stand: 1: +2 Total assist;From chair/3-in-1;With upper extremity assist;With armrests Sit to Stand Details (indicate cue type and reason): patient 70% using momentum to get onto feet ("one, two, three").  Attempted with only one person assist, but unscuccessful from low seat.   Stand to Sit: 4: Min assist;With upper extremity assist;With armrests;To chair/3-in-1 Stand to Sit Details: min assist to help control descent to sit.   Patient with uncontrolled "plop" descent.  Ambulation/Gait Ambulation/Gait: Yes Ambulation/Gait Assistance: 4: Min assist (two people for safety, chair to follow) Ambulation/Gait Assistance Details (indicate cue type and reason): min assist to steady patient during gait.  Assist to help steer RW and much verbal encouragement  Ambulation Distance (Feet): 10 Feet Assistive device: Rolling walker (wide) Gait Pattern: Step-through pattern;Trunk flexed;Shuffle;Antalgic Gait velocity: <1.8 ft/sec putting her at high risk for recurrent falls.     End of Session PT - End of Session Equipment Utilized During Treatment: Other (comment) (wide RW, chair to follow) Activity Tolerance: Patient limited by pain Patient left: in chair;with call bell in reach;with family/visitor present (mom at end of session, elevated feet in recliner) Nurse Communication: Mobility status for transfers;Mobility status for ambulation;Other (comment) (questions about wound care) General Behavior During Session: [facility] for tasks performed Cognition: [facility] for tasks performed  Flannery Cavallero B. [place], [place], DPT [phone] 01/23/2012, 2:23 PM

## 2012-01-23 NOTE — Progress Notes (Signed)
Contact precautions dced per IP

## 2012-01-23 NOTE — Progress Notes (Signed)
ANTIBIOTIC CONSULT NOTE - FOLLOW UP  Pharmacy Consult for Vancomycin Indication: skin abscesses  Allergies  Allergen Reactions  . Augmentin Diarrhea  . Rosiglitazone Maleate     REACTION: swelling  . Amoxicillin Rash   Vital Signs: Temp: 97.4 F (36.3 C) (02/26 0626) Temp src: Oral (02/26 0626) BP: 137/81 mmHg (02/26 0626) Pulse Rate: 84  (02/26 0626) Intake/Output from previous day: 02/25 0701 - 02/26 0700 In: 2160 [P.O.:1560; IV Piggyback:600] Out: 2951 [Urine:2950; Stool:1] Intake/Output from this shift: Total I/O In: 1080 [P.O.:1080] Out: 350 [Urine:350]  Labs:  [facility] 01/23/12 0600 01/22/12 0540 01/21/12 0530  WBC 13.2* 14.2* 16.1*  HGB 7.7* 7.8* 7.8*  PLT 371 387 379  LABCREA -- -- --  CREATININE 1.95* 2.12* 2.24*   Estimated Creatinine Clearance: 59.9 ml/min (by C-G formula based on Cr of 1.95).  Microbiology: Recent Results (from the past 720 hour(s))  URINE CULTURE     Status: Normal   Collection Time   01/19/12  6:56 PM      Component Value Range Status Comment   Specimen Description URINE, CATHETERIZED   Final    Special Requests NONE   Final    Culture  Setup Time [id number]   Final    Colony Count NO GROWTH   Final    Culture NO GROWTH   Final    Report Status 01/21/2012 FINAL   Final   URINE CULTURE     Status: Normal   Collection Time   01/20/12  3:48 PM      Component Value Range Status Comment   Specimen Description URINE, CATHETERIZED   Final    Special Requests NONE   Final    Culture  Setup Time [id number]   Final    Colony Count >=100,000 COLONIES/ML   Final    Culture ESCHERICHIA COLI   Final    Report Status 01/23/2012 FINAL   Final    Organism ID, Bacteria ESCHERICHIA COLI   Final   WOUND CULTURE     Status: Normal   Collection Time   01/21/12  5:32 PM      Component Value Range Status Comment   Specimen Description WOUND GROIN   Final    Special Requests NONE   Final    Gram Stain     Final    Value: NO WBC SEEN     FEW  SQUAMOUS EPITHELIAL CELLS PRESENT     ABUNDANT GRAM VARIABLE ROD     MODERATE GRAM POSITIVE COCCI IN PAIRS   Culture FEW STREPTOCOCCUS GROUP G   Final    Report Status 01/22/2012 FINAL   Final     Anti-infectives     Start     Dose/Rate Route Frequency Ordered Stop   01/20/12 2200   vancomycin (VANCOCIN) 2,000 mg in sodium chloride 0.9 % 500 mL IVPB        2,000 mg 250 mL/hr over 120 Minutes Intravenous Every 24 hours 01/20/12 2051     01/20/12 1600   levofloxacin (LEVAQUIN) IVPB 500 mg        500 mg 100 mL/hr over 60 Minutes Intravenous Every 24 hours 01/20/12 1518           Assessment: 51yof continues on Vancomycin/Levaquin day#4 for skin abscesses. sCr is trending down.  Vancomycin is now at steady state so will need to get a trough tonight if it is to continue. Of note, patient with Ecoli UTI that is resistant to quinolones.  Also with group  G strep growing in her wound.  Goal of Therapy:  Vancomycin trough level 10-15 mcg/ml  Plan:  1) Vancomycin trough tonight at 2130 if it continues 2) Consider de-escalating antibiotics to better cover UTI and wound infection  Deboraha Sprang 01/23/2012,3:07 PM

## 2012-01-24 LAB — BASIC METABOLIC PANEL
BUN: 41 mg/dL — ABNORMAL HIGH (ref 6–23)
CO2: 21 mEq/L (ref 19–32)
Chloride: 107 mEq/L (ref 96–112)
Creatinine, Ser: 1.95 mg/dL — ABNORMAL HIGH (ref 0.50–1.10)

## 2012-01-24 LAB — CBC
HCT: 29.8 % — ABNORMAL LOW (ref 36.0–46.0)
MCV: 73 fL — ABNORMAL LOW (ref 78.0–100.0)
RBC: 4.08 MIL/uL (ref 3.87–5.11)
WBC: 14.1 10*3/uL — ABNORMAL HIGH (ref 4.0–10.5)

## 2012-01-24 LAB — GLUCOSE, CAPILLARY: Glucose-Capillary: 163 mg/dL — ABNORMAL HIGH (ref 70–99)

## 2012-01-24 MED ORDER — FUROSEMIDE 10 MG/ML IJ SOLN
40.0000 mg | Freq: Two times a day (BID) | INTRAMUSCULAR | Status: AC
  Administered 2012-01-24 – 2012-01-25 (×2): 40 mg via INTRAVENOUS
  Filled 2012-01-24 (×2): qty 4

## 2012-01-24 MED ORDER — DEXTROSE 5 % IV SOLN
1.0000 g | INTRAVENOUS | Status: DC
  Administered 2012-01-24 – 2012-01-28 (×5): 1 g via INTRAVENOUS
  Filled 2012-01-24 (×6): qty 10

## 2012-01-24 NOTE — Plan of Care (Signed)
Problem: Phase I Progression Outcomes Goal: EF % per last Echo/documented,Core Reminder form on chart Outcome: Not Progressing Pt refused echo today.

## 2012-01-24 NOTE — Progress Notes (Signed)
Patient ID: [name]    [id number]    DOB: October 11, 1961    DOA: 01/19/2012  PCP: [name], MD, MD  Subjective: Shortness of breath improving, however significant peripheral edema  Objective: Weight change: -6.7 kg (-14 lb 12.3 oz)  Intake/Output Summary (Last 24 hours) at 01/24/12 1809 Last data filed at 01/24/12 1749  Gross per 24 hour  Intake    570 ml  Output   1650 ml  Net  -1080 ml   Blood pressure 132/80, pulse 85, temperature 97.9 F (36.6 C), temperature source Oral, resp. rate 18, height 5\' 7"  (1.702 m), weight 176 kg (388 lb 0.2 oz), last menstrual period 07/26/2011, SpO2 93.00%.  Physical Exam: General: Alert and awake, oriented x3, not in any acute distress. HEENT: anicteric sclera, pupils reactive to light and accommodation, EOMI CVS: S1-S2 clear, no murmur rubs or gallops Chest: Difficult to auscultate breath sounds due to body habitus, no wheezing no rhonchi or rales, bilaterally Abdomen: Morbidly obese, soft nontender, nondistended, normal bowel sounds, no organomegaly Extremities: 2- 3+ pitting edema bilaterally Neuro: Cranial nerves II-XII intact, no focal neurological deficits  Lab Results: Basic Metabolic Panel:  Lab [id number] 01/23/12 0600  NA 138 137  K 5.2* 5.1  CL 107 108  CO2 21 19  GLUCOSE 82 77  BUN 41* 42*  CREATININE 1.95* 1.95*  CALCIUM 8.8 8.4  MG -- --  PHOS -- --   Liver Function Tests:  Lab 01/19/12 2008  AST 9  ALT <5  ALKPHOS 153*  BILITOT 0.4  PROT 9.4*  ALBUMIN 2.4*   CBC:  Lab 01/24/12 0533 01/23/12 0600 01/19/12 2008  WBC 14.1* 13.2* --  NEUTROABS -- -- 14.9*  HGB 8.1* 7.7* --  HCT 29.8* 27.5* --  MCV 73.0* 72.4* --  PLT 372 371 --   Cardiac Enzymes:  Lab 01/20/12 1633 01/20/12 1035 01/20/12 0139  CKTOTAL 72 80 91  CKMB 2.9 2.7 2.9  CKMBINDEX -- -- --  TROPONINI <0.30 <0.30 <0.30   BNP: No components found with this basename: POCBNP:2 CBG:  Lab 01/24/12 1150 01/23/12 2123 01/23/12 1635  01/23/12 1133 01/23/12 0911  GLUCAP 89 112* 127* 120* 146*     Micro Results: Recent Results (from the past 240 hour(s))  URINE CULTURE     Status: Normal   Collection Time   01/19/12  6:56 PM      Component Value Range Status Comment   Specimen Description URINE, CATHETERIZED   Final    Special Requests NONE   Final    Culture  Setup Time [id number]   Final    Colony Count NO GROWTH   Final    Culture NO GROWTH   Final    Report Status 01/21/2012 FINAL   Final   URINE CULTURE     Status: Normal   Collection Time   01/20/12  3:48 PM      Component Value Range Status Comment   Specimen Description URINE, CATHETERIZED   Final    Special Requests NONE   Final    Culture  Setup Time [id number]   Final    Colony Count >=100,000 COLONIES/ML   Final    Culture ESCHERICHIA COLI   Final    Report Status 01/23/2012 FINAL   Final    Organism ID, Bacteria ESCHERICHIA COLI   Final   WOUND CULTURE     Status: Normal   Collection Time   01/21/12  5:32 PM  Component Value Range Status Comment   Specimen Description WOUND GROIN   Final    Special Requests NONE   Final    Gram Stain     Final    Value: NO WBC SEEN     FEW SQUAMOUS EPITHELIAL CELLS PRESENT     ABUNDANT GRAM VARIABLE ROD     MODERATE GRAM POSITIVE COCCI IN PAIRS   Culture FEW STREPTOCOCCUS GROUP G   Final    Report Status 01/22/2012 FINAL   Final     Studies/Results: Dg Chest 1 View  01/21/2012  *RADIOLOGY REPORT*  Clinical Data: CHF.  CHEST - 1 VIEW  Comparison: 01/19/2012  Findings: Study is severely limited.  Study was done as a cross- table decubitus view due to the patient's condition and size. There appears to be cardiomegaly.  Lung fields are not visualized well enough to comment on.  IMPRESSION: Severely limited study as above.  Original Report Authenticated By: Raelyn Number, M.D.   Dg Chest Port 1 View  01/19/2012  *RADIOLOGY REPORT*  Clinical Data: [place] earlier today.  History of hypertension, diabetes,  more obesity, and CHF.  PORTABLE CHEST - 1 VIEW 01/19/2012 1740 hours:  Comparison: Two-view chest x-ray 08/16/2011 [facility] in [facility]/20/2009 [facility].  Findings: Patient rotated to the right.  Underexposed image due to portable technique.  Apparent right jugular central venous catheter which can be followed to the upper SVC, though the tip is not visible.  Cardiac silhouette enlarged but stable.  Pulmonary venous hypertension.  Apparent hazy opacity over the left lung and the right mid lung is felt to be artifactual and related to the rotation.  IMPRESSION: Suboptimal examination due to patient rotation and portable technique.  Stable cardiomegaly.  Pulmonary venous hypertension.  Repeat imaging on the fixed radiographic unit would be suggested when the patient is clinically able.  Original Report Authenticated By: Deniece Portela, M.D.    Medications: Scheduled Meds:   . aspirin EC  325 mg Oral Daily  . carvedilol  3.125 mg Oral BID WC  . DULoxetine  60 mg Oral Daily  . enoxaparin  40 mg Subcutaneous Daily  . ferrous sulfate  325 mg Oral BID WC  . folic acid  1 mg Oral Daily  . furosemide  40 mg Oral BID  . insulin aspart  0-5 Units Subcutaneous QHS  . insulin aspart  0-9 Units Subcutaneous TID WC  . insulin aspart protamine-insulin aspart  20 Units Subcutaneous Q supper  . insulin aspart protamine-insulin aspart  35 Units Subcutaneous Q breakfast  . levofloxacin (LEVAQUIN) IV  500 mg Intravenous Q24H  . levothyroxine  25 mcg Oral Q breakfast  . simvastatin  20 mg Oral q1800  . sodium polystyrene  30 g Oral Once  . vancomycin  1,250 mg Intravenous Q24H  . ascorbic acid  500 mg Oral BID  . zinc sulfate  220 mg Oral Daily  . DISCONTD: vancomycin  2,000 mg Intravenous Q24H   Continuous Infusions:    Assessment/Plan:  *Acute on chronic Systolic congestive heart failure: slight clinical improvement, but significant peripheral edema  - Changed Lasix to IV for next 2  doses, I offered her cardiology consultation, patient however refused at this time (states that she does not want 'another doctor bill' )  - BMP in AM, last echo in January 2011, EF of 35-40%, I will obtain repeat echocardiogram  - Continue daily weights and I's and O's   Active Problems:  Hydradenitis Suppurative  - currently in active flare, per Dr. Pablo Lawrence discussion with patient's primary ID doctor, Dr. Drucilla Schmidt recommended continuing Vanc and Levaquin   - he has recommended placing picc line and to continue Vanc for another month   Escherichia coli UTI  - Per sensitivities, resistant to Levaquin, will change to IV Rocephin. I will DC Levaquin   HTN (hypertension)  - remains stable   DM type 2 (diabetes mellitus, type 2) uncontrolled, A1c 8.5   Iron deficiency anemia  - Hg remains stable and at pt's baseline   Hyperkalemia:  - BMP in AM   Chronic kidney disease (CKD), stage III (moderate)  - creatinine trending down  - BMP in AM    DVT Prophylaxis: Lovenox  Disposition: Not medically ready   LOS: 5 days   Kathryn Linarez M.D. [facility] 01/24/2012, 6:09 PM Pager: [phone]

## 2012-01-24 NOTE — Progress Notes (Signed)
SPOKE WITH PT SHE STATED THAT SHE HAD [facility] IN THE PAST AND WOULD LIKE TO CONT WITH THEM FOR HH NEEDS.  PT REFUSED PT/OT AT THIS TIME BUT STATED THAT SHE WILL GET A RW.  [facility] NOTIFIED OF NEEDS FOR RW AND WAS NOTIFIED THAT PT STATED THAT SHE MAY NEED IV ABX AT [facility] PER THE DOCTOR.  WHEN ATTENDING NOTIFIES ME OF ABX NEEDS ILL INFORM [facility].  Chauncy Lean [phone] OR [phone] 01/24/2012

## 2012-01-24 NOTE — Progress Notes (Signed)
PT Cancellation Note  Treatment cancelled today due to patient's refusal to participate.  The patient states that she would like to wait until after her next round of pain medicine.  That is due at 1500.  PT to check back at 1530 as time and extra help are available.    Jeshurun [place], Harrel Lemon 01/24/2012, 1:21 PM

## 2012-01-25 DIAGNOSIS — I5023 Acute on chronic systolic (congestive) heart failure: Principal | ICD-10-CM

## 2012-01-25 LAB — GLUCOSE, CAPILLARY
Glucose-Capillary: 66 mg/dL — ABNORMAL LOW (ref 70–99)
Glucose-Capillary: 174 mg/dL — ABNORMAL HIGH (ref 70–99)

## 2012-01-25 MED ORDER — ISOSORBIDE MONONITRATE ER 30 MG PO TB24
30.0000 mg | ORAL_TABLET | Freq: Every day | ORAL | Status: DC
  Administered 2012-01-25 – 2012-01-30 (×6): 30 mg via ORAL
  Filled 2012-01-25 (×6): qty 1

## 2012-01-25 MED ORDER — ASPIRIN EC 81 MG PO TBEC
81.0000 mg | DELAYED_RELEASE_TABLET | Freq: Every day | ORAL | Status: DC
  Administered 2012-01-26 – 2012-01-30 (×5): 81 mg via ORAL
  Filled 2012-01-25 (×5): qty 1

## 2012-01-25 MED ORDER — INSULIN ASPART PROT & ASPART (70-30 MIX) 100 UNIT/ML ~~LOC~~ SUSP
15.0000 [IU] | Freq: Every day | SUBCUTANEOUS | Status: DC
  Administered 2012-01-25 – 2012-01-29 (×5): 15 [IU] via SUBCUTANEOUS
  Filled 2012-01-25: qty 3

## 2012-01-25 MED ORDER — FUROSEMIDE 10 MG/ML IJ SOLN
40.0000 mg | Freq: Three times a day (TID) | INTRAMUSCULAR | Status: DC
  Administered 2012-01-25 – 2012-01-29 (×12): 40 mg via INTRAVENOUS
  Filled 2012-01-25 (×14): qty 4

## 2012-01-25 MED ORDER — HYDRALAZINE HCL 25 MG PO TABS
12.5000 mg | ORAL_TABLET | Freq: Three times a day (TID) | ORAL | Status: DC
  Administered 2012-01-25 – 2012-01-26 (×2): 12.5 mg via ORAL
  Filled 2012-01-25 (×5): qty 0.5

## 2012-01-25 MED ORDER — INSULIN ASPART PROT & ASPART (70-30 MIX) 100 UNIT/ML ~~LOC~~ SUSP
30.0000 [IU] | Freq: Every day | SUBCUTANEOUS | Status: DC
  Administered 2012-01-27 – 2012-01-30 (×3): 30 [IU] via SUBCUTANEOUS

## 2012-01-25 MED ORDER — FUROSEMIDE 10 MG/ML IJ SOLN
40.0000 mg | Freq: Two times a day (BID) | INTRAMUSCULAR | Status: DC
  Administered 2012-01-25: 40 mg via INTRAVENOUS
  Filled 2012-01-25 (×2): qty 4

## 2012-01-25 NOTE — Consult Note (Signed)
Advanced Heart Failure Team Consult Note  Referring Physician: Dr. Tana Coast Primary Physician: Primary Cardiologist: Dr. Radford Pax  Reason for Consultation: massive volume overload due to systolic HF  HPI:    [name] is a 51 y.o. African American female with pertinent history of presumed nonischemic cardiomyopathy with last EF 35-40% in 2011 (no report of cath), chronic renal insufficiency, diabetes mellitus, hypertension, obesity, hypothyroidism and hidradenitis suppurativa.   Echo 11/2009: LVEF 35-40% Akinesis of distal anteroseptal and apical walls. RV normal. Mild MR/TR. pRVSP 46  She was admitted on 2/23 for progressive dyspnea and lower extremity edema over the last month.  ProBNP 12043, troponin negative.  She was admitted and placed on IV lasix  She was transitioned to oral regimen on 2/25 but her weight increased and she is now back to IV lasix.  She is currently 383 pounds which is down from 402.  Renal function stable with Cr 1.95 (down from 2.36).  Urine culture showed E. Coli, on rocephin.  She also currently has an active flare of her hydradenitis suppurative and is on vanc per Dr. Tommy Medal.  He has recommended PICC line for vanc x63month.  She has refused repeat echo twice this admission.   Dr. Tana Coast has asked the heart failure team to evaluate for further recommendations.  On evaluation she endorses eating fast food and missing lasix doses.  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 She says over the last month she has just felt really bad she doesn't get around to cooking.  She has had increased dyspnea over the last month and can barely ambulate room without dyspnea.  She feels better but not back to baseline.  She has a scale at home but does not weigh herself regularly. She states her typical weight is around 300 pounds.  She has never been tested for OSA.      Review of Systems: [y] = yes, [ ]  = no   General: Weight gain  [ y]; Weight loss [ ] ; Anorexia [ ] ; Fatigue [ ] ; Fever [ ] ; Chills [ ] ; Weakness Blue.Reese ]  Cardiac: Chest pain/pressure [ ] ; Resting SOB [ ] ; Exertional SOB [ y]; Orthopnea Blue.Reese ]; Pedal Edema Blue.Reese ]; Palpitations [ ] ; Syncope [ ] ; Presyncope [ ] ; Paroxysmal nocturnal dyspnea[y ]  Pulmonary: Cough [ ] ; Wheezing[ ] ; Hemoptysis[ ] ; Sputum [ ] ; Snoring [ ]   GI: Vomiting[ ] ; Dysphagia[ ] ; Melena[ ] ; Hematochezia [ ] ; Heartburn[ ] ; Abdominal pain [ ] ; Constipation [ ] ; Diarrhea [ ] ; BRBPR [ ]   GU: Hematuria[ ] ; Dysuria [ ] ; Nocturia[ ]   Vascular: Pain in legs with walking [ ] ; Pain in feet with lying flat [ ] ; Non-healing sores [ ] ; Stroke [ ] ; TIA [ ] ; Slurred speech [ ] ;  Neuro: Headaches[ ] ; Vertigo[ ] ; Seizures[ ] ; Paresthesias[ ] ;Blurred vision [ ] ; Diplopia [ ] ; Vision changes [ ]   Ortho/Skin: Arthritis [ ] ; Joint pain [ ] ; Muscle pain [ ] ; Joint swelling [ ] ; Back Pain [ ] ; Rash [ ]   Psych: Depression[ ] ; Anxiety[ ]   Heme: Bleeding problems [ ] ; Clotting disorders [ ] ; Anemia [ ]   Endocrine: Diabetes Blue.Reese ]; Thyroid dysfunction[ ]   Home Medications Prior to Admission medications   Medication Sig Start Date End Date Taking? Authorizing Provider  ascorbic acid (VITAMIN C) 500 MG tablet Take 500 mg by mouth 2 (two) times daily.     Yes Historical Provider,  MD  carvedilol (COREG) 3.125 MG tablet Take 3.125 mg by mouth 2 (two) times daily with a meal. Take one tab by mouth with food twice a day    Yes Historical Provider, MD  DULoxetine (CYMBALTA) 60 MG capsule Take 60 mg by mouth daily.     Yes Historical Provider, MD  ferrous sulfate 325 (65 FE) MG tablet Take 325 mg by mouth 2 (two) times daily.     Yes Historical Provider, MD  folic acid (FOLVITE) 1 MG tablet Take 1 mg by mouth daily.   Yes Historical Provider, MD  furosemide (LASIX) 40 MG tablet 40 mg. Take 2 tablets twice a day    Yes Historical Provider, MD  HYDROcodone-acetaminophen (VICODIN) 5-500 MG per tablet Take 1-2 tablets by mouth every 6  hours for pain    Yes Historical Provider, MD  insulin lispro protamine-insulin lispro (HUMALOG 75/25) (75-25) 100 UNIT/ML SUSP Give 35 units subq every morning and 20 units sub q every PM    Yes Historical Provider, MD  levothyroxine (SYNTHROID, LEVOTHROID) 25 MCG tablet Take 25 mcg by mouth daily.   Yes Historical Provider, MD  levothyroxine (SYNTHROID, LEVOTHROID) 50 MCG tablet Take 50 mcg by mouth. Each morning on an empty stomach 30 before meal    Yes Historical Provider, MD  magnesium oxide (MAG-OX) 400 MG tablet Take 400 mg by mouth 2 (two) times daily.    Yes Historical Provider, MD  oxyCODONE-acetaminophen (PERCOCET) 5-325 MG per tablet Take 1 tablet by mouth every 6 (six) hours as needed. For pain   Yes Historical Provider, MD  pravastatin (PRAVACHOL) 40 MG tablet Take 40 mg by mouth daily.     Yes Historical Provider, MD  spironolactone (ALDACTONE) 25 MG tablet Take 25 mg by mouth daily.    Yes Historical Provider, MD  zinc sulfate 220 MG capsule Take 220 mg by mouth daily.   Yes Historical Provider, MD    Past Medical History: Past Medical History  Diagnosis Date  . Systolic congestive heart failure   . SOB (shortness of breath)   . HTN (hypertension)   . DM type 2 (diabetes mellitus, type 2)   . Chronic kidney disease (CKD), stage III (moderate)   . Iron deficiency anemia   . Morbid obesity   . Hyperkalemia   . Hypothyroidism   . CHF (congestive heart failure)     Past Surgical History: Past Surgical History  Procedure Date  . Portacath placement     Family History: Family History  Problem Relation Age of Onset  . Coronary artery disease Mother   . Hypertension Mother   . Diabetes type II Mother   . Malignant hyperthermia Mother   . Coronary artery disease Father   . Hypertension Father   . Malignant hyperthermia Father   . Cancer Maternal Grandfather     ? Type    Social History: History   Social History  . Marital Status: Married    Spouse Name: N/A      Number of Children: N/A  . Years of Education: N/A   Social History Main Topics  . Smoking status: Never Smoker   . Smokeless tobacco: None  . Alcohol Use: No  . Drug Use: No  . Sexually Active: No   Other Topics Concern  . None   Social History Narrative  . None    Allergies:  Allergies  Allergen Reactions  . Augmentin Diarrhea  . Rosiglitazone Maleate     REACTION: swelling  .  Amoxicillin Rash    Objective:    Vital Signs:   Temp:  [97.6 F (36.4 C)-98.5 F (36.9 C)] 98.5 F (36.9 C) (02/28 1343) Pulse Rate:  [78-83] 82  (02/28 1343) Resp:  [20] 20  (02/28 1343) BP: (125-139)/(51-80) 134/80 mmHg (02/28 1343) SpO2:  [98 %-100 %] 98 % (02/28 1343) Weight:  [174.1 kg (383 lb 13.1 oz)] 174.1 kg (383 lb 13.1 oz) (02/28 0702) Last BM Date: 01/24/12  24-hour weight change: Weight change:   Intake/Output:   Intake/Output Summary (Last 24 hours) at 01/25/12 1607 Last data filed at 01/25/12 1509  Gross per 24 hour  Intake   1364 ml  Output   3700 ml  Net  -2336 ml     Physical Exam: General:  Lying in bed. Massively obese.  No resp difficulty HEENT: normal Neck: supple. Unable to evaluate JVD. Carotids 2+ bilat; . No lymphadenopathy or thryomegaly appreciated. Cor: PMI nonpalpabe. Distant heart sounds/ Regular rate & rhythm. No rubs, gallops or murmurs. Lungs: clear Abdomen: markedly obese. soft, nontender.Good bowel sounds. Extremities: no cyanosis, clubbing, rash. 3+ woody edema Neuro: alert & orientedx3, cranial nerves grossly intact. moves all 4 extremities w/o difficulty. Affect pleasant  Telemetry: Sinus 80s with PVCs  Labs: Basic Metabolic Panel:  Lab [id number] 0533 01/23/12 0600 01/22/12 0540 01/21/12 0530 01/19/12 2008  NA 138 137 138 141 136  K 5.2* 5.1 5.1 5.2* 5.4*  CL 107 108 110 113* 105  CO2 21 19 20 19 19   GLUCOSE 82 77 90 78 115*  BUN 41* 42* 44* 45* 44*  CREATININE 1.95* 1.95* 2.12* 2.24* 2.36*  CALCIUM 8.8 8.4 8.5 -- --  MG --  -- -- -- --  PHOS -- -- -- -- --    Liver Function Tests:  Lab 01/19/12 2008  AST 9  ALT <5  ALKPHOS 153*  BILITOT 0.4  PROT 9.4*  ALBUMIN 2.4*   No results found for this basename: LIPASE:5,AMYLASE:5 in the last 168 hours No results found for this basename: AMMONIA:3 in the last 168 hours  CBC:  Lab 01/24/12 0533 01/23/12 0600 01/22/12 0540 01/21/12 0530 01/19/12 2008  WBC 14.1* 13.2* 14.2* 16.1* 17.7*  NEUTROABS -- -- -- -- 14.9*  HGB 8.1* 7.7* 7.8* 7.8* 8.7*  HCT 29.8* 27.5* 28.1* 28.2* 31.5*  MCV 73.0* 72.4* 72.4* 72.7* 73.3*  PLT 372 371 387 379 423*    Cardiac Enzymes:  Lab 01/20/12 1633 01/20/12 1035 01/20/12 0139 01/19/12 2150  CKTOTAL 72 80 91 --  CKMB 2.9 2.7 2.9 --  CKMBINDEX -- -- -- --  TROPONINI <0.30 <0.30 <0.30 <0.30    BNP: No components found with this basename: POCBNP:5  CBG:  Lab 01/25/12 1100 01/25/12 0839 01/25/12 0659 01/24/12 2212 01/24/12 1833  GLUCAP 100* 79 66* 98 163*    Coagulation Studies: No results found for this basename: LABPROT:5,INR:5 in the last 72 hours  Other results: EKG: Sinus rhythm 75 No ST-T wave abnormalities.    Imaging:  No results found.   Medications:     Current Medications:    . aspirin EC  325 mg Oral Daily  . carvedilol  3.125 mg Oral BID WC  . cefTRIAXone (ROCEPHIN)  IV  1 g Intravenous Q24H  . DULoxetine  60 mg Oral Daily  . enoxaparin  40 mg Subcutaneous Daily  . ferrous sulfate  325 mg Oral BID WC  . folic acid  1 mg Oral Daily  . furosemide  40 mg Intravenous  BID  . furosemide  40 mg Intravenous BID  . insulin aspart  0-5 Units Subcutaneous QHS  . insulin aspart  0-9 Units Subcutaneous TID WC  . insulin aspart protamine-insulin aspart  15 Units Subcutaneous Q supper  . insulin aspart protamine-insulin aspart  30 Units Subcutaneous Q breakfast  . levothyroxine  25 mcg Oral Q breakfast  . simvastatin  20 mg Oral q1800  . sodium polystyrene  30 g Oral Once  . vancomycin  1,250 mg  Intravenous Q24H  . ascorbic acid  500 mg Oral BID  . zinc sulfate  220 mg Oral Daily  . DISCONTD: furosemide  40 mg Oral BID  . DISCONTD: insulin aspart protamine-insulin aspart  20 Units Subcutaneous Q supper  . DISCONTD: insulin aspart protamine-insulin aspart  35 Units Subcutaneous Q breakfast  . DISCONTD: levofloxacin (LEVAQUIN) IV  500 mg Intravenous Q24H     Infusions:      Assessment:   1. Acute on chronic systolic HF 2. NICM, EF 35-40% - refusing repeat echo 3. E. Coli UTI     - on rocephin 4. Acute on chronic renal failure 5. DM2 6. HTN 7. Morbid obesity 8. Medical noncompliance  Plan/Discussion:    Length of Stay: [address] [name], [place]  01/25/2012, 4:07 PM  Patient seen and examined with Shelley Haven PA-C. We discussed all aspects of the encounter. I agree with the assessment and plan as stated above.    Difficult situation. She is quite noncompliant in taking care of herself despite multiple attempts previously. She is also markedly obese which makes adequate evaluation of her volume status difficult. That said, she appears to have marked volume overload despite a good diuresis already. Will increase lasix to 40 tid. Continue carvedilol. Given renal failure not candidate for cath or ACE-I/ARB. Will start hydral/imdur. She doesn not appear overly receptive to working with the HF team or receiving HF education however we will continue follow her and see if we can provide some resources which will help her take better care of herself. Will d/w SW and case management.   Time spent 45 minutes.   Shelley Paolillo,MD 6:10 PM

## 2012-01-25 NOTE — Progress Notes (Signed)
Patient ID: [name]    [id number]    DOB: December 11, 1960    DOA: 01/19/2012  PCP: [name], MD, MD  Subjective: Shortness of breath improving, however significant peripheral edema, also says 'my belly is tight due to fluid everywhere'  Objective: Weight change:   Intake/Output Summary (Last 24 hours) at 01/25/12 1607 Last data filed at 01/25/12 1509  Gross per 24 hour  Intake   1364 ml  Output   3700 ml  Net  -2336 ml   Blood pressure 134/80, pulse 82, temperature 98.5 F (36.9 C), temperature source Oral, resp. rate 20, height 5\' 7"  (1.702 m), weight 174.1 kg (383 lb 13.1 oz), last menstrual period 07/26/2011, SpO2 98.00%.  Physical Exam: General: Alert and awake, oriented x3, not in any acute distress. HEENT: anicteric sclera, pupils reactive to light and accommodation, EOMI CVS: S1-S2 clear, no murmur rubs or gallops Chest: Difficult to auscultate breath sounds due to body habitus, no wheezing no rhonchi or rales, bilaterally Abdomen: Morbidly obese, soft nontender, nondistended, normal bowel sounds, no organomegaly Extremities: 2- 3+ pitting edema bilaterally Neuro: Cranial nerves II-XII intact, no focal neurological deficits  Lab Results: Basic Metabolic Panel:  Lab [id number] 0533 01/23/12 0600  NA 138 137  K 5.2* 5.1  CL 107 108  CO2 21 19  GLUCOSE 82 77  BUN 41* 42*  CREATININE 1.95* 1.95*  CALCIUM 8.8 8.4  MG -- --  PHOS -- --   Liver Function Tests:  Lab 01/19/12 2008  AST 9  ALT <5  ALKPHOS 153*  BILITOT 0.4  PROT 9.4*  ALBUMIN 2.4*   CBC:  Lab 01/24/12 0533 01/23/12 0600 01/19/12 2008  WBC 14.1* 13.2* --  NEUTROABS -- -- 14.9*  HGB 8.1* 7.7* --  HCT 29.8* 27.5* --  MCV 73.0* 72.4* --  PLT 372 371 --   Cardiac Enzymes:  Lab 01/20/12 1633 01/20/12 1035 01/20/12 0139  CKTOTAL 72 80 91  CKMB 2.9 2.7 2.9  CKMBINDEX -- -- --  TROPONINI <0.30 <0.30 <0.30   BNP: No components found with this basename: POCBNP:2 CBG:  Lab 01/25/12  1100 01/25/12 0839 01/25/12 0659 01/24/12 2212 01/24/12 1833  GLUCAP 100* 79 66* 98 163*     Micro Results: Recent Results (from the past 240 hour(s))  URINE CULTURE     Status: Normal   Collection Time   01/19/12  6:56 PM      Component Value Range Status Comment   Specimen Description URINE, CATHETERIZED   Final    Special Requests NONE   Final    Culture  Setup Time [id number]   Final    Colony Count NO GROWTH   Final    Culture NO GROWTH   Final    Report Status 01/21/2012 FINAL   Final   URINE CULTURE     Status: Normal   Collection Time   01/20/12  3:48 PM      Component Value Range Status Comment   Specimen Description URINE, CATHETERIZED   Final    Special Requests NONE   Final    Culture  Setup Time [id number]   Final    Colony Count >=100,000 COLONIES/ML   Final    Culture ESCHERICHIA COLI   Final    Report Status 01/23/2012 FINAL   Final    Organism ID, Bacteria ESCHERICHIA COLI   Final   WOUND CULTURE     Status: Normal   Collection Time   01/21/12  5:32 PM  Component Value Range Status Comment   Specimen Description WOUND GROIN   Final    Special Requests NONE   Final    Gram Stain     Final    Value: NO WBC SEEN     FEW SQUAMOUS EPITHELIAL CELLS PRESENT     ABUNDANT GRAM VARIABLE ROD     MODERATE GRAM POSITIVE COCCI IN PAIRS   Culture FEW STREPTOCOCCUS GROUP G   Final    Report Status 01/22/2012 FINAL   Final     Studies/Results: Dg Chest 1 View  01/21/2012  *RADIOLOGY REPORT*  Clinical Data: CHF.  CHEST - 1 VIEW  Comparison: 01/19/2012  Findings: Study is severely limited.  Study was done as a cross- table decubitus view due to the patient's condition and size. There appears to be cardiomegaly.  Lung fields are not visualized well enough to comment on.  IMPRESSION: Severely limited study as above.  Original Report Authenticated By: Raelyn Number, M.D.   Dg Chest Port 1 View  01/19/2012  *RADIOLOGY REPORT*  Clinical Data: [place] earlier today.   History of hypertension, diabetes, more obesity, and CHF.  PORTABLE CHEST - 1 VIEW 01/19/2012 1740 hours:  Comparison: Two-view chest x-ray 08/16/2011 [facility] in [facility].  Findings: Patient rotated to the right.  Underexposed image due to portable technique.  Apparent right jugular central venous catheter which can be followed to the upper SVC, though the tip is not visible.  Cardiac silhouette enlarged but stable.  Pulmonary venous hypertension.  Apparent hazy opacity over the left lung and the right mid lung is felt to be artifactual and related to the rotation.  IMPRESSION: Suboptimal examination due to patient rotation and portable technique.  Stable cardiomegaly.  Pulmonary venous hypertension.  Repeat imaging on the fixed radiographic unit would be suggested when the patient is clinically able.  Original Report Authenticated By: Deniece Portela, M.D.    Medications: Scheduled Meds:    . aspirin EC  325 mg Oral Daily  . carvedilol  3.125 mg Oral BID WC  . cefTRIAXone (ROCEPHIN)  IV  1 g Intravenous Q24H  . DULoxetine  60 mg Oral Daily  . enoxaparin  40 mg Subcutaneous Daily  . ferrous sulfate  325 mg Oral BID WC  . folic acid  1 mg Oral Daily  . furosemide  40 mg Intravenous BID  . furosemide  40 mg Intravenous BID  . insulin aspart  0-5 Units Subcutaneous QHS  . insulin aspart  0-9 Units Subcutaneous TID WC  . insulin aspart protamine-insulin aspart  15 Units Subcutaneous Q supper  . insulin aspart protamine-insulin aspart  30 Units Subcutaneous Q breakfast  . levothyroxine  25 mcg Oral Q breakfast  . simvastatin  20 mg Oral q1800  . sodium polystyrene  30 g Oral Once  . vancomycin  1,250 mg Intravenous Q24H  . ascorbic acid  500 mg Oral BID  . zinc sulfate  220 mg Oral Daily  . DISCONTD: furosemide  40 mg Oral BID  . DISCONTD: insulin aspart protamine-insulin aspart  20 Units Subcutaneous Q supper  . DISCONTD: insulin aspart protamine-insulin  aspart  35 Units Subcutaneous Q breakfast  . DISCONTD: levofloxacin (LEVAQUIN) IV  500 mg Intravenous Q24H   Continuous Infusions:    Assessment/Plan:  *Acute on chronic Systolic congestive heart failure: slight clinical improvement, but significant per edema  - Cont IV lasix (weight down from 402->383)  - BMP in AM, last echo  in January 2011, EF of 35-40%, patient refuses repeat echocardiogram  - Continue daily weights and I's and O's, I discussed with patient's cardiologist, Dr Radford Pax who also recommended Heart Failure Team to follow for further recommendations. Called HF team for consult.    Active Problems:  Hydradenitis Suppurative  - currently in active flare, per Dr. Pablo Lawrence discussion with patient's primary ID doctor, Dr. Drucilla Schmidt recommended continuing Vanc and Levaquin (changed to rocephin for UTI)    - Dr Tommy Medal has recommended placing picc line and to continue Vanc for another month at discharge   Escherichia coli UTI  - Per sensitivities, resistant to Levaquin, changed to IV Rocephin.  HTN (hypertension)  - remains stable   DM type 2 (diabetes mellitus, type 2) uncontrolled, A1c 8.5  Iron deficiency anemia  - Hg remains stable and at pt's baseline   Hyperkalemia:  - BMP in AM   Chronic kidney disease (CKD), stage III (moderate)  - BMP in AM   DVT Prophylaxis: Lovenox  Disposition: Not medically ready   LOS: 6 days   Damira Kem M.D. [facility] 01/25/2012, 4:07 PM Pager: [phone]

## 2012-01-25 NOTE — Progress Notes (Signed)
CRITICAL VALUE ALERT  Critical value received: CBG 66  Date of notification:  01/25/12  Time of notification:  0820  Critical value read back: yes  Nurse who received alert:  Lonzo Cloud  MD notified (1st page):  R. Rai  Time of first page:  0828  MD notified (2nd page):R. Rai  Time of second [id number]  Responding MD: Tyler Pita    Time MD responded:  [phone]

## 2012-01-25 NOTE — Progress Notes (Signed)
Physical Therapy Treatment Patient Details Name: [name] MRN: [id number] DOB: 28-Mar-1961 Today's Date: 01/25/2012  PT Assessment/Plan  PT - Assessment/Plan Comments on Treatment Session: The patient is improving slowly and deficits in bed mobility may be due to a more compliant and smaller bed.  I still have concerns about her being strong enough to get into her home (8 steps to enter a full flight to get upstairs per husband).  She is getting more deconditioned and needs max encouragement to participate in gait training alone.  Continue to premedicate and try to see in PM per patient's request.   PT Plan: Discharge plan remains appropriate;Frequency remains appropriate PT Frequency: Min 3X/week Follow Up Recommendations: Supervision/Assistance - 24 hour;Home health PT Equipment Recommended: Rolling walker with 5" wheels (wide) PT Goals  Acute Rehab PT Goals PT Goal: Supine/Side to Sit - Progress: Progressing toward goal PT Goal: Sit to Supine/Side - Progress: Progressing toward goal Pt will go Sit to Stand: with modified independence PT Goal: Sit to Stand - Progress: Updated due to goal met Pt will go Stand to Sit: with modified independence PT Goal: Stand to Sit - Progress: Updated due to goals met PT Goal: Ambulate - Progress: Progressing toward goal  PT Treatment Precautions/Restrictions  Precautions Precautions: Fall Required Braces or Orthoses: No Restrictions Weight Bearing Restrictions: No Other Position/Activity Restrictions: Left foot is painful as well as anytime foley catheter is moved Mobility (including Balance) Bed Mobility Bed Mobility: Yes Rolling Right: 6: Modified independent (Device/Increase time);With rail Right Sidelying to Sit: 3: Mod assist Right Sidelying to Sit Details (indicate cue type and reason): mod assist of trunk to get chest up to fully upright sitting.  Patient using bil arms to push up to sit.   Sitting - Scoot to Edge of Bed: 4: Min  assist Sitting - Scoot to [place] of Bed Details (indicate cue type and reason): min hand held assist to help weight shift hips to EOB.   Sit to Supine: 1: +2 Total assist;HOB flat;With rail Sit to Supine - Details (indicate cue type and reason): Patietn 80%.  She needed assist of both legs and trunk to get back into bed.  She report she normally crawls into her king bed at home, but cannot do that here secondary to such a small bed.   Scooting to [facility]: 1: +2 Total assist;With rail (Patient 80% ) Scooting to [facility] Details (indicate cue type and reason): bed in trendelenberg, patient pulling with both arms and 2 people assisting in scooting hips and trunk with draw pad.   Transfers Sit to Stand: 5: Supervision;With upper extremity assist;From bed;From elevated surface Sit to Stand Details (indicate cue type and reason): bed raised, supervision for safety  Stand to Sit: 5: Supervision;To elevated surface;To bed Stand to Sit Details: supervision for safety Ambulation/Gait Ambulation/Gait Assistance: 5: Supervision Ambulation/Gait Assistance Details (indicate cue type and reason): supervision for safety, verbal cues for posture and to stay a litte closer to RW (although body mass prevents her from getting too close to even a wide RW).   Ambulation Distance (Feet): 15 Feet Assistive device: Rolling walker Gait Pattern: Step-through pattern;Trunk flexed Gait velocity: <1.8 ft/sec putting her at high risk for recurrent falls.      End of Session PT - End of Session Equipment Utilized During Treatment:  (wide RW) Activity Tolerance: Patient limited by pain;Patient limited by fatigue Patient left: in bed;with call bell in reach General Behavior During Session: [facility] for tasks performed (not very motivated to  work with PT or push herself ) Cognition: WFL for tasks performed  Wells Guiles B. Donaldson, [place], DPT [phone] 01/25/2012, 9:03 AM

## 2012-01-25 NOTE — Progress Notes (Signed)
   CARE MANAGEMENT NOTE 01/25/2012  Patient:  [facility]   Account Number:  [id number]  Date Initiated:  01/25/2012  Documentation initiated by:  Lars Pinks  Subjective/Objective Assessment:   PT WAS ADMITTED WITH SOB AND OVERLOAD     Action/Plan:   PROGRESSIO OF CARE AND DISCHARGE PLANNING   Anticipated DC Date:  01/29/2012   Anticipated DC Plan:  [place]  CM consult      Choice offered to / List presented to:     DME arranged  Vassie Moselle      DME agency  [place].        Status of service:  In process, will continue to follow Medicare Important Message given?   (If response is "NO", the following Medicare IM given date fields will be blank) Date Medicare IM given:   Date Additional Medicare IM given:    Discharge Disposition:    Per UR Regulation:  Reviewed for med. necessity/level of care/duration of stay  Comments:  UR COMPLETED.  Lars Pinks, RN, BSN 01/25/12 1627 PT WAS ADMITTED WITH SOB AND HF.  PT CONTS TO HAVE FLUID AND IS RECEIVING IV LASIX.  PT REFUSED HH PT/OT AT THIS TIME AS RECOMMENDED.  PT DID AGREE TO A WIDE ROLLING WALKER.  WILL F/U ON DC NEEDS

## 2012-01-25 NOTE — Progress Notes (Signed)
OT Cancellation Note  Treatment cancelled today due to patient's refusal to participate. "Too much pain in abdomen (fluid), feet (fluid), and hydronitis supporia is hurting her as well" . Explained the importance of getting up (pt in recliner) and working with therapy (to keep from PNA and DVTs)--pt verbalizes that she understands, but cannot do it today. Asked her when a good time would be for possible treatment tomorrow and she said 45 minutes after pain meds. Will make every attempt to see tomorrow at time she says will be good.    Almon Register [id number] 01/25/2012, 2:06 PM

## 2012-01-26 LAB — BASIC METABOLIC PANEL
BUN: 40 mg/dL — ABNORMAL HIGH (ref 6–23)
Chloride: 109 mEq/L (ref 96–112)
GFR calc Af Amer: 33 mL/min — ABNORMAL LOW (ref >90)
Glucose, Bld: 93 mg/dL (ref 70–99)
Potassium: 4.9 mEq/L (ref 3.5–5.1)

## 2012-01-26 LAB — GLUCOSE, CAPILLARY: Glucose-Capillary: 89 mg/dL (ref 70–99)

## 2012-01-26 MED ORDER — HYDRALAZINE HCL 25 MG PO TABS
25.0000 mg | ORAL_TABLET | Freq: Three times a day (TID) | ORAL | Status: DC
  Administered 2012-01-26 – 2012-01-29 (×9): 25 mg via ORAL
  Filled 2012-01-26 (×12): qty 1

## 2012-01-26 NOTE — Progress Notes (Signed)
  Echocardiogram 2D Echocardiogram cannot be performed due to patient refusing the exam 4 times since 01/20/12.  Basilia Jumbo [facility] 01/26/2012, 7:34 AM

## 2012-01-26 NOTE — Progress Notes (Signed)
Patient ID: [name]    [id number]    DOB: [birth year]    DOA: 01/19/2012  PCP: [name], MD, MD  Subjective: Shortness of breath improving,  Objective: Weight change:   Intake/Output Summary (Last 24 hours) at 01/26/12 1841 Last data filed at 01/26/12 1609  Gross per 24 hour  Intake    724 ml  Output   5950 ml  Net  -5226 ml   Blood pressure 143/66, pulse 82, temperature 97.5 F (36.4 C), temperature source Oral, resp. rate 20, height 5\' 7"  (1.702 m), weight 170.6 kg (376 lb 1.7 oz), last menstrual period 07/26/2011, SpO2 97.00%.  Physical Exam: General: Alert and awake, oriented x3, not in any acute distress. HEENT: anicteric sclera, pupils reactive to light and accommodation, EOMI CVS: S1-S2 clear, no murmur rubs or gallops Chest: Difficult to auscultate breath sounds due to body habitus, no wheezing no rhonchi or rales, bilaterally Abdomen: Morbidly obese, soft nontender, nondistended, normal bowel sounds, no organomegaly Extremities: 2- 3+ pitting edema bilaterally Neuro: Cranial nerves II-XII intact, no focal neurological deficits  Lab Results: Basic Metabolic Panel:  Lab [id number] 0700 01/24/12 0533  NA 140 138  K 4.9 5.2*  CL 109 107  CO2 22 21  GLUCOSE 93 82  BUN 40* 41*  CREATININE 1.99* 1.95*  CALCIUM 8.7 8.8  MG -- --  PHOS -- --   Liver Function Tests:  Lab 01/19/12 2008  AST 9  ALT <5  ALKPHOS 153*  BILITOT 0.4  PROT 9.4*  ALBUMIN 2.4*   CBC:  Lab 01/24/12 0533 01/23/12 0600 01/19/12 2008  WBC 14.1* 13.2* --  NEUTROABS -- -- 14.9*  HGB 8.1* 7.7* --  HCT 29.8* 27.5* --  MCV 73.0* 72.4* --  PLT 372 371 --   Cardiac Enzymes:  Lab 01/20/12 1633 01/20/12 1035 01/20/12 0139  CKTOTAL 72 80 91  CKMB 2.9 2.7 2.9  CKMBINDEX -- -- --  TROPONINI <0.30 <0.30 <0.30   BNP: No components found with this basename: POCBNP:2 CBG:  Lab 01/26/12 1601 01/26/12 1108 01/26/12 0725 01/25/12 2147 01/25/12 1659  GLUCAP 181* 166* 89 174* 104*       Micro Results: Recent Results (from the past 240 hour(s))  URINE CULTURE     Status: Normal   Collection Time   01/19/12  6:56 PM      Component Value Range Status Comment   Specimen Description URINE, CATHETERIZED   Final    Special Requests NONE   Final    Culture  Setup Time [id number]   Final    Colony Count NO GROWTH   Final    Culture NO GROWTH   Final    Report Status 01/21/2012 FINAL   Final   URINE CULTURE     Status: Normal   Collection Time   01/20/12  3:48 PM      Component Value Range Status Comment   Specimen Description URINE, CATHETERIZED   Final    Special Requests NONE   Final    Culture  Setup Time [id number]   Final    Colony Count >=100,000 COLONIES/ML   Final    Culture ESCHERICHIA COLI   Final    Report Status 01/23/2012 FINAL   Final    Organism ID, Bacteria ESCHERICHIA COLI   Final   WOUND CULTURE     Status: Normal   Collection Time   01/21/12  5:32 PM      Component Value Range Status Comment   Specimen  Description WOUND GROIN   Final    Special Requests NONE   Final    Gram Stain     Final    Value: NO WBC SEEN     FEW SQUAMOUS EPITHELIAL CELLS PRESENT     ABUNDANT GRAM VARIABLE ROD     MODERATE GRAM POSITIVE COCCI IN PAIRS   Culture FEW STREPTOCOCCUS GROUP G   Final    Report Status 01/22/2012 FINAL   Final     Studies/Results: Dg Chest 1 View  01/21/2012  *RADIOLOGY REPORT*  Clinical Data: CHF.  CHEST - 1 VIEW  Comparison: 01/19/2012  Findings: Study is severely limited.  Study was done as a cross- table decubitus view due to the patient's condition and size. There appears to be cardiomegaly.  Lung fields are not visualized well enough to comment on.  IMPRESSION: Severely limited study as above.  Original Report Authenticated By: Raelyn Number, M.D.   Dg Chest Port 1 View  01/19/2012  *RADIOLOGY REPORT*  Clinical Data: [place] earlier today.  History of hypertension, diabetes, more obesity, and CHF.  PORTABLE CHEST - 1 VIEW 01/19/2012  1740 hours:  Comparison: Two-view chest x-ray 08/16/2011 [facility] in 12/20/2009 [facility].  Findings: Patient rotated to the right.  Underexposed image due to portable technique.  Apparent right jugular central venous catheter which can be followed to the upper SVC, though the tip is not visible.  Cardiac silhouette enlarged but stable.  Pulmonary venous hypertension.  Apparent hazy opacity over the left lung and the right mid lung is felt to be artifactual and related to the rotation.  IMPRESSION: Suboptimal examination due to patient rotation and portable technique.  Stable cardiomegaly.  Pulmonary venous hypertension.  Repeat imaging on the fixed radiographic unit would be suggested when the patient is clinically able.  Original Report Authenticated By: Deniece Portela, M.D.    Medications: Scheduled Meds:    . aspirin EC  81 mg Oral Daily  . carvedilol  3.125 mg Oral BID WC  . cefTRIAXone (ROCEPHIN)  IV  1 g Intravenous Q24H  . DULoxetine  60 mg Oral Daily  . enoxaparin  40 mg Subcutaneous Daily  . ferrous sulfate  325 mg Oral BID WC  . folic acid  1 mg Oral Daily  . furosemide  40 mg Intravenous Q8H  . hydrALAZINE  25 mg Oral Q8H  . insulin aspart  0-5 Units Subcutaneous QHS  . insulin aspart  0-9 Units Subcutaneous TID WC  . insulin aspart protamine-insulin aspart  15 Units Subcutaneous Q supper  . insulin aspart protamine-insulin aspart  30 Units Subcutaneous Q breakfast  . isosorbide mononitrate  30 mg Oral Daily  . levothyroxine  25 mcg Oral Q breakfast  . simvastatin  20 mg Oral q1800  . sodium polystyrene  30 g Oral Once  . vancomycin  1,250 mg Intravenous Q24H  . ascorbic acid  500 mg Oral BID  . zinc sulfate  220 mg Oral Daily  . DISCONTD: hydrALAZINE  12.5 mg Oral Q8H   Continuous Infusions:    Assessment/Plan:  *Acute on chronic Systolic congestive heart failure: slight clinical improvement, but significant per edema  - Cont IV lasix (weight  down from 402->376)  - BMP in AM, last echo in January 2011, EF of 35-40%, patient refuses repeat echocardiogram  - Continue daily weights and I's and O's, appreciate heart failure team following and recommendations  Active Problems:  Hydradenitis Suppurative  - currently in  active flare, per Dr. Pablo Lawrence discussion with patient's primary ID doctor, Dr. Drucilla Schmidt recommended continuing Vanc and Levaquin (changed to rocephin for UTI)    - Dr Tommy Medal has recommended placing picc line and to continue Vanc for another month at discharge.    Escherichia coli UTI  - Per sensitivities, resistant to Levaquin, continue IV Rocephin.  HTN (hypertension)  - remains stable   DM type 2 (diabetes mellitus, type 2) uncontrolled, A1c 8.5  Iron deficiency anemia  - Ordered CBC in a.m.    Hyperkalemia:  - BMP in AM   Chronic kidney disease (CKD), stage III (moderate)  - BMP in AM   DVT Prophylaxis: Lovenox  Disposition: Not medically ready   LOS: 7 days   Humbert Morozov M.D. [facility] 01/26/2012, 6:41 PM Pager: [phone]

## 2012-01-26 NOTE — Progress Notes (Signed)
Physical Therapy Treatment Patient Details Name: [name] MRN: [id number] DOB: Mar 31, 1961 Today's Date: 01/26/2012  PT Assessment/Plan  PT - Assessment/Plan Comments on Treatment Session: The patient is progressing well today. Fluid removal seems to help mobility.  Encouraged pt and her husband to do laps in the room with the RW this weekend as PT will not be seeing her this weekend.   PT Plan: Discharge plan remains appropriate;Frequency remains appropriate PT Frequency: Min 3X/week Follow Up Recommendations: Supervision/Assistance - 24 hour;Home health PT Equipment Recommended: Rolling walker with 5" wheels (wide) PT Goals  Acute Rehab PT Goals PT Goal: Sit to Stand - Progress: Progressing toward goal PT Goal: Stand to Sit - Progress: Progressing toward goal PT Goal: Ambulate - Progress: Progressing toward goal  PT Treatment Precautions/Restrictions  Precautions Precautions: Fall Required Braces or Orthoses: No Restrictions Weight Bearing Restrictions: No Other Position/Activity Restrictions: Left foot is painful as well as anytime foley catheter is moved Mobility (including Balance) Transfers Sit to Stand: 1: +2 Total assist;With upper extremity assist;From chair/3-in-1 Sit to Stand Details (indicate cue type and reason): from low recliner chair patient 80%, needs to rock to use momentum, won't use armrests on chairs because she says she can't get enough leverage.   Stand to Sit: 5: Supervision Stand to Sit Details: supervision for safety secondary to patient is fatigued.   Ambulation/Gait Ambulation/Gait Assistance: 5: Supervision Ambulation/Gait Assistance Details (indicate cue type and reason): supervision for safety, but patient is walking more upright today and able to go further.   Ambulation Distance (Feet): 40 Feet Assistive device: Rolling walker Gait Pattern: Step-through pattern Gait velocity: Continues to walk less than 1.8 ft/sec putting her at risk for  recurrent falls.      End of Session PT - End of Session Equipment Utilized During Treatment:  (wide RW) Activity Tolerance: Patient limited by fatigue;Patient limited by pain (feported back pain with gait. ) Patient left: in chair;with call bell in reach;with family/visitor present (husband present for treatment.  ) General Behavior During Session: [facility] for tasks performed Cognition: [facility] for tasks performed  Kerin Cecchi B. Webb, [place], DPT [phone] 01/26/2012, 12:42 PM

## 2012-01-26 NOTE — Progress Notes (Deleted)
Admission nurse stated concerns for patient receiving illegal drugs from an outside source. Dr. Tana Coast has been notified and ordered suggested that patient be moved to a monitored room. Patient has been moved to room 4730.

## 2012-01-26 NOTE — Progress Notes (Signed)
Advanced Heart Failure Rounding Note   Subjective:    [name] is a 51 y.o. African American female with pertinent history of presumed nonischemic cardiomyopathy with last EF 35-40% in 2011 (no report of cath), chronic renal insufficiency, diabetes mellitus, hypertension, obesity, hypothyroidism and hidradenitis suppurativa.   Echo 11/2009: LVEF 35-40% Akinesis of distal anteroseptal and apical walls. RV normal. Mild MR/TR. pRVSP 46   Lasix increased to 40 mg IV TID yesterday evening.  Out 4L.  Weight down 7 more pounds. Breathing improved. Slept well.  No orthopnea/PND.    Hydralazine/imdur added yesterday, no ACE-I 2/2 renal failure.  Cr stable 1.99.  Objective:    Vital Signs:   Temp:  [97.2 F (36.2 C)-98.5 F (36.9 C)] 97.2 F (36.2 C) (03/01 0725) Pulse Rate:  [76-82] 79  (03/01 0725) Resp:  [18-20] 20  (03/01 0725) BP: (129-148)/(69-80) 148/77 mmHg (03/01 0725) SpO2:  [97 %-100 %] 97 % (03/01 0725) Weight:  [170.6 kg (376 lb 1.7 oz)] 170.6 kg (376 lb 1.7 oz) (03/01 0725) Last BM Date: 02/22/12   Intake/Output:   Intake/Output Summary (Last 24 hours) at 01/26/12 1016 Last data filed at 01/26/12 0848  Gross per 24 hour  Intake   1138 ml  Output   6225 ml  Net  -5087 ml    Wt 376 lbs  Physical Exam: General: Lying in bed. Massively obese. No resp difficulty  HEENT: normal  Neck: supple. Unable to evaluate JVD. Carotids 2+ bilat; . No lymphadenopathy or thryomegaly appreciated.  Cor: PMI nonpalpabe. Distant heart sounds/ Regular rate & rhythm. No rubs, gallops or murmurs.  Lungs: clear  Abdomen: markedly obese. soft, nontender.Good bowel sounds.  Extremities: no cyanosis, clubbing, rash. 2-3+ woody edema  Neuro: alert & orientedx3, cranial nerves grossly intact. moves all 4 extremities w/o difficulty. Affect pleasant  Telemetry: SR 70-80s with PVCs  Labs: Basic Metabolic Panel:  Lab [id number] 0700 01/24/12 0533 01/23/12 0600 01/22/12 0540 01/21/12 0530  NA 140  138 137 138 141  K 4.9 5.2* 5.1 5.1 5.2*  CL 109 107 108 110 113*  CO2 22 21 19 20 19   GLUCOSE 93 82 77 90 78  BUN 40* 41* 42* 44* 45*  CREATININE 1.99* 1.95* 1.95* 2.12* 2.24*  CALCIUM 8.7 8.8 8.4 -- --  MG -- -- -- -- --  PHOS -- -- -- -- --    Liver Function Tests:  Lab 01/19/12 2008  AST 9  ALT <5  ALKPHOS 153*  BILITOT 0.4  PROT 9.4*  ALBUMIN 2.4*   No results found for this basename: LIPASE:5,AMYLASE:5 in the last 168 hours No results found for this basename: AMMONIA:3 in the last 168 hours  CBC:  Lab 01/24/12 0533 01/23/12 0600 01/22/12 0540 01/21/12 0530 01/19/12 2008  WBC 14.1* 13.2* 14.2* 16.1* 17.7*  NEUTROABS -- -- -- -- 14.9*  HGB 8.1* 7.7* 7.8* 7.8* 8.7*  HCT 29.8* 27.5* 28.1* 28.2* 31.5*  MCV 73.0* 72.4* 72.4* 72.7* 73.3*  PLT 372 371 387 379 423*    Cardiac Enzymes:  Lab 01/20/12 1633 01/20/12 1035 01/20/12 0139 01/19/12 2150  CKTOTAL 72 80 91 --  CKMB 2.9 2.7 2.9 --  CKMBINDEX -- -- -- --  TROPONINI <0.30 <0.30 <0.30 <0.30    BNP: No components found with this basename: POCBNP:5  CBG:  Lab 01/26/12 0725 01/25/12 2147 01/25/12 1659 01/25/12 1100 01/25/12 0839  GLUCAP 89 174* 104* 100* 79    Coagulation Studies: No results found for this basename:  LABPROT:5,INR:5 in the last 72 hours   Imaging: No results found.   Medications:     Scheduled Medications:    . aspirin EC  81 mg Oral Daily  . carvedilol  3.125 mg Oral BID WC  . cefTRIAXone (ROCEPHIN)  IV  1 g Intravenous Q24H  . DULoxetine  60 mg Oral Daily  . enoxaparin  40 mg Subcutaneous Daily  . ferrous sulfate  325 mg Oral BID WC  . folic acid  1 mg Oral Daily  . furosemide  40 mg Intravenous Q8H  . hydrALAZINE  25 mg Oral Q8H  . insulin aspart  0-5 Units Subcutaneous QHS  . insulin aspart  0-9 Units Subcutaneous TID WC  . insulin aspart protamine-insulin aspart  15 Units Subcutaneous Q supper  . insulin aspart protamine-insulin aspart  30 Units Subcutaneous Q breakfast    . isosorbide mononitrate  30 mg Oral Daily  . levothyroxine  25 mcg Oral Q breakfast  . simvastatin  20 mg Oral q1800  . sodium polystyrene  30 g Oral Once  . vancomycin  1,250 mg Intravenous Q24H  . ascorbic acid  500 mg Oral BID  . zinc sulfate  220 mg Oral Daily  . DISCONTD: aspirin EC  325 mg Oral Daily  . DISCONTD: furosemide  40 mg Intravenous BID  . DISCONTD: hydrALAZINE  12.5 mg Oral Q8H    Infusions:    PRN Medications: acetaminophen, ondansetron (ZOFRAN) IV, oxyCODONE-acetaminophen   Assessment:   1. Acute on chronic systolic HF  2. NICM, EF 35-40% - refusing repeat echo  3. E. Coli UTI  - on rocephin  4. Acute on chronic renal failure  5. DM2  6. HTN  7. Morbid obesity  8. Medical noncompliance  Plan/Discussion:    Remains with marked volume overload but with brisk diuresis on IV lasix.  Will continue current regimen but with increase in hydralazine 25 mg q8.  Cr stable at 1.99.  Would continue to use IV diuresis as long as renal function remains stable.  Would also recommend continued titration of hydralazine and coreg as tolerated.       Will continue HF education and importance of compliance today.     Length of Stay: [address], [place]  01/26/2012, 10:16 AM   Patient seen and examined with Leone Haven PA-C. We discussed all aspects of the encounter. I agree with the assessment and plan as stated above.   Volume status much better on exam. Would continue iv lasix at least 1 more day (if not 2). Agree with increasing HDL/nitrates. Discussed f/u in HF clinic and she is interested in seeing [place] on d/c. Will get her an appt for next week.   Stressed need for medication/dietary compliance and need to ambulate.  Over weekend would:  1) Switch IV lasix to po when dry.  2) Continue to titrate hydral/ntg 3) Possibly home Sunday or Monday  Shelley Quince Desmen Schoffstall,MD 2:16 PM

## 2012-01-26 NOTE — Progress Notes (Signed)
ANTIBIOTIC CONSULT NOTE - FOLLOW UP  Pharmacy Consult for Vancomycin  Indication: Skin Abscesses  Allergies  Allergen Reactions  . Augmentin Diarrhea  . Rosiglitazone Maleate     REACTION: swelling  . Amoxicillin Rash    Patient Measurements: Height: 5\' 7"  (170.2 cm) Weight: 376 lb 1.7 oz (170.6 kg) (bedscale) IBW/kg (Calculated) : 61.6   Vital Signs: Temp: 97.2 F (36.2 C) (03/01 0725) Temp src: Oral (03/01 0725) BP: 122/72 mmHg (03/01 1413) Pulse Rate: 79  (03/01 0725) Intake/Output from previous day: 02/28 0701 - 03/01 0700 In: 1138 [P.O.:1080; IV Piggyback:58] Out: [id number] [Urine:4650] Intake/Output from this shift: Total I/O In: 480 [P.O.:480] Out: 3000 [Urine:3000]  Labs:  Basename 01/26/12 0700 01/24/12 0533  WBC -- 14.1*  HGB -- 8.1*  PLT -- 372  LABCREA -- --  CREATININE 1.99* 1.95*   Estimated Creatinine Clearance: 56.2 ml/min (by C-G formula based on Cr of 1.99).  Basename 01/23/12 2058  VANCOTROUGH 23.1*  VANCOPEAK --  Jake Michaelis --  GENTTROUGH --  GENTPEAK --  GENTRANDOM --  TOBRATROUGH --  TOBRAPEAK --  TOBRARND --  AMIKACINPEAK --  AMIKACINTROU --  AMIKACIN --     Microbiology: Recent Results (from the past 720 hour(s))  URINE CULTURE     Status: Normal   Collection Time   01/19/12  6:56 PM      Component Value Range Status Comment   Specimen Description URINE, CATHETERIZED   Final    Special Requests NONE   Final    Culture  Setup Time [id number]   Final    Colony Count NO GROWTH   Final    Culture NO GROWTH   Final    Report Status 01/21/2012 FINAL   Final   URINE CULTURE     Status: Normal   Collection Time   01/20/12  3:48 PM      Component Value Range Status Comment   Specimen Description URINE, CATHETERIZED   Final    Special Requests NONE   Final    Culture  Setup Time [id number]   Final    Colony Count >=100,000 COLONIES/ML   Final    Culture ESCHERICHIA COLI   Final    Report Status 01/23/2012 FINAL   Final    Organism ID, Bacteria ESCHERICHIA COLI   Final   WOUND CULTURE     Status: Normal   Collection Time   01/21/12  5:32 PM      Component Value Range Status Comment   Specimen Description WOUND GROIN   Final    Special Requests NONE   Final    Gram Stain     Final    Value: NO WBC SEEN     FEW SQUAMOUS EPITHELIAL CELLS PRESENT     ABUNDANT GRAM VARIABLE ROD     MODERATE GRAM POSITIVE COCCI IN PAIRS   Culture FEW STREPTOCOCCUS GROUP G   Final    Report Status 01/22/2012 FINAL   Final     Anti-infectives     Start     Dose/Rate Route Frequency Ordered Stop   01/24/12 1830   cefTRIAXone (ROCEPHIN) 1 g in dextrose 5 % 50 mL IVPB        1 g 100 mL/hr over 30 Minutes Intravenous Every 24 hours 01/24/12 1820     01/23/12 2300   vancomycin (VANCOCIN) 1,250 mg in sodium chloride 0.9 % 250 mL IVPB        1,250 mg 166.7 mL/hr over 90 Minutes Intravenous  Every 24 hours 01/23/12 2232     01/20/12 2200   vancomycin (VANCOCIN) 2,000 mg in sodium chloride 0.9 % 500 mL IVPB  Status:  Discontinued        2,000 mg 250 mL/hr over 120 Minutes Intravenous Every 24 hours 01/20/12 2051 01/23/12 2232   01/20/12 1600   levofloxacin (LEVAQUIN) IVPB 500 mg  Status:  Discontinued        500 mg 100 mL/hr over 60 Minutes Intravenous Every 24 hours 01/20/12 1518 01/24/12 1820          Assessment: 47 yof continues on Vancomycin/Levaquin day#7 for skin abscesses (hidradenitis suppurativa). Vancomycin trough supratherapeutic (23.1) previously and dose was reduced.  Per Dr. Josem Kaufmann note, planning to continue vancomycin for an additional month.  Renal function relatively stable.  Goal of Therapy:  Vancomycin trough level 10-15 mcg/ml  Plan:  - Continue Vancomycin to 1250 mg IV Q24 - Would recheck vancomycin trough Monday or Tuesday.  Zackory Pudlo C 01/26/2012,2:18 PM

## 2012-01-27 DIAGNOSIS — I509 Heart failure, unspecified: Secondary | ICD-10-CM

## 2012-01-27 LAB — CBC
HCT: 28.5 % — ABNORMAL LOW (ref 36.0–46.0)
Hemoglobin: 8 g/dL — ABNORMAL LOW (ref 12.0–15.0)
MCH: 20.6 pg — ABNORMAL LOW (ref 26.0–34.0)
MCHC: 28.1 g/dL — ABNORMAL LOW (ref 30.0–36.0)
RBC: 3.88 MIL/uL (ref 3.87–5.11)

## 2012-01-27 LAB — BASIC METABOLIC PANEL
BUN: 38 mg/dL — ABNORMAL HIGH (ref 6–23)
CO2: 22 mEq/L (ref 19–32)
Chloride: 106 mEq/L (ref 96–112)
GFR calc non Af Amer: 29 mL/min — ABNORMAL LOW (ref >90)
Glucose, Bld: 87 mg/dL (ref 70–99)
Potassium: 4.7 mEq/L (ref 3.5–5.1)
Sodium: 138 mEq/L (ref 135–145)

## 2012-01-27 LAB — GLUCOSE, CAPILLARY
Glucose-Capillary: 162 mg/dL — ABNORMAL HIGH (ref 70–99)
Glucose-Capillary: 118 mg/dL — ABNORMAL HIGH (ref 70–99)

## 2012-01-27 NOTE — Plan of Care (Signed)
Problem: Food- and Nutrition-Related Knowledge Deficit (NB-1.1) Goal: Nutrition education Formal process to instruct or train a patient/client in a skill or to impart knowledge to help patients/clients voluntarily manage or modify food choices and eating behavior to maintain or improve health.  Outcome: Completed/Met Date Met:  01/27/12 Chart reviewed.  Patient is morbidly obese with BMI=63.2.  No other nutrition problems identified on admission.  Received consult for HF diet education.  Patient was sleeping during RD visit.  Requested that RD leave handouts on bedside table for her to review at a later time.  RD left heart failure nutrition therapy handout with RD contact information for any questions.  Will follow-up for further diet education early next week if patient is still here.    [name] [phone]

## 2012-01-27 NOTE — Progress Notes (Signed)
Patient ID: [name]    [id number]    DOB: 10/04/1961    DOA: 01/19/2012  PCP: Shelley Schwalbe, MD, MD  Subjective: Resting comfortably, shortness of breath improving,  Objective: Weight change: -3.5 kg (-7 lb 11.5 oz)  Intake/Output Summary (Last 24 hours) at 01/27/12 1356 Last data filed at 01/27/12 0900  Gross per 24 hour  Intake    360 ml  Output   5600 ml  Net  -5240 ml   Blood pressure 135/65, pulse 83, temperature 98.5 F (36.9 C), temperature source Oral, resp. rate 18, height 5\' 7"  (1.702 m), weight 173.138 kg (381 lb 11.2 oz), last menstrual period 07/26/2011, SpO2 97.00%.  Physical Exam: General: Alert and awake, oriented x3, not in any acute distress. HEENT: anicteric sclera, pupils reactive to light and accommodation, EOMI CVS: S1-S2 clear, no murmur rubs or gallops Chest: Difficult to auscultate breath sounds due to body habitus, no wheezing no rhonchi or rales, bilaterally Abdomen: Morbidly obese, soft nontender, nondistended, normal bowel sounds, no organomegaly Extremities: 2- 3+ pitting edema bilaterally Neuro: Cranial nerves II-XII intact, no focal neurological deficits  Lab Results: Basic Metabolic Panel:  Lab [id number] 0500 01/26/12 0700  NA 138 140  K 4.7 4.9  CL 106 109  CO2 22 22  GLUCOSE 87 93  BUN 38* 40*  CREATININE 1.93* 1.99*  CALCIUM 8.8 8.7  MG -- --  PHOS -- --   CBC:  Lab 01/27/12 0500 01/24/12 0533  WBC 12.7* 14.1*  NEUTROABS -- --  HGB 8.0* 8.1*  HCT 28.5* 29.8*  MCV 73.5* 73.0*  PLT 339 372   Cardiac Enzymes:  Lab 01/20/12 1633  CKTOTAL 72  CKMB 2.9  CKMBINDEX --  TROPONINI <0.30   BNP: No components found with this basename: POCBNP:2 CBG:  Lab 01/27/12 1118 01/27/12 0611 01/26/12 2127 01/26/12 1601 01/26/12 1108  GLUCAP 153* 118* 162* 181* 166*     Micro Results: Recent Results (from the past 240 hour(s))  URINE CULTURE     Status: Normal   Collection Time   01/19/12  6:56 PM      Component Value Range  Status Comment   Specimen Description URINE, CATHETERIZED   Final    Special Requests NONE   Final    Culture  Setup Time [id number]   Final    Colony Count NO GROWTH   Final    Culture NO GROWTH   Final    Report Status 01/21/2012 FINAL   Final   URINE CULTURE     Status: Normal   Collection Time   01/20/12  3:48 PM      Component Value Range Status Comment   Specimen Description URINE, CATHETERIZED   Final    Special Requests NONE   Final    Culture  Setup Time [id number]   Final    Colony Count >=100,000 COLONIES/ML   Final    Culture ESCHERICHIA COLI   Final    Report Status 01/23/2012 FINAL   Final    Organism ID, Bacteria ESCHERICHIA COLI   Final   WOUND CULTURE     Status: Normal   Collection Time   01/21/12  5:32 PM      Component Value Range Status Comment   Specimen Description WOUND GROIN   Final    Special Requests NONE   Final    Gram Stain     Final    Value: NO WBC SEEN     FEW SQUAMOUS EPITHELIAL CELLS PRESENT  ABUNDANT GRAM VARIABLE ROD     MODERATE GRAM POSITIVE COCCI IN PAIRS   Culture FEW STREPTOCOCCUS GROUP G   Final    Report Status 01/22/2012 FINAL   Final     Studies/Results: Dg Chest 1 View  01/21/2012  *RADIOLOGY REPORT*  Clinical Data: CHF.  CHEST - 1 VIEW  Comparison: 01/19/2012  Findings: Study is severely limited.  Study was done as a cross- table decubitus view due to the patient's condition and size. There appears to be cardiomegaly.  Lung fields are not visualized well enough to comment on.  IMPRESSION: Severely limited study as above.  Original Report Authenticated By: [name], M.D.   Dg Chest Port 1 View  01/19/2012    IMPRESSION: Suboptimal examination due to patient rotation and portable technique.  Stable cardiomegaly.  Pulmonary venous hypertension.  Repeat imaging on the fixed radiographic unit would be suggested when the patient is clinically able.  Original Report Authenticated By: [name], M.D.     Medications: Scheduled Meds:    . aspirin EC  81 mg Oral Daily  . carvedilol  3.125 mg Oral BID WC  . cefTRIAXone (ROCEPHIN)  IV  1 g Intravenous Q24H  . DULoxetine  60 mg Oral Daily  . enoxaparin  40 mg Subcutaneous Daily  . ferrous sulfate  325 mg Oral BID WC  . folic acid  1 mg Oral Daily  . furosemide  40 mg Intravenous Q8H  . hydrALAZINE  25 mg Oral Q8H  . insulin aspart  0-5 Units Subcutaneous QHS  . insulin aspart  0-9 Units Subcutaneous TID WC  . insulin aspart protamine-insulin aspart  15 Units Subcutaneous Q supper  . insulin aspart protamine-insulin aspart  30 Units Subcutaneous Q breakfast  . isosorbide mononitrate  30 mg Oral Daily  . levothyroxine  25 mcg Oral Q breakfast  . simvastatin  20 mg Oral q1800  . sodium polystyrene  30 g Oral Once  . vancomycin  1,250 mg Intravenous Q24H  . ascorbic acid  500 mg Oral BID  . zinc sulfate  220 mg Oral Daily   Continuous Infusions:    Assessment/Plan:  *Acute on chronic Systolic congestive heart failure: slight clinical improvement, but significant per edema  - Cont IV lasix ,  Creatinine stable - Daily BMET, last echo in January 2011, EF of 35-40%, patient refuses repeat echocardiogram  - Continue daily weights and I's and O's, appreciate heart failure team following and recommendations  Active Problems:  Hydradenitis Suppurative  - currently in active flare, per Dr. Pablo [name] discussion with patient's primary ID doctor, Dr. Drucilla [name] recommended continuing Vanc and Levaquin (changed to rocephin for UTI)    - Dr [name] has recommended placing picc line and to continue Vanc for another month at discharge.    Escherichia coli UTI  - Per sensitivities, resistant to Levaquin, continue IV Rocephin.  HTN (hypertension)  - remains stable   DM type 2 (diabetes mellitus, type 2) uncontrolled, A1c 8.5  Iron deficiency anemia  - H&H stable for now     Hyperkalemia: Resolved  Chronic kidney disease (CKD), stage  III (moderate)  -Creatinine stable, holding at 1.9  DVT Prophylaxis: Lovenox  Disposition: Not medically ready   LOS: 8 days   [name] M.D. [facility] 01/27/2012, 1:56 PM Pager: [phone]

## 2012-01-27 NOTE — Progress Notes (Signed)
Patient ID: [name], female   DOB: 23-May-1961, 51 y.o.   MRN: [id number] Chart reviewed. Excellent diuresis yesterday. BUN/CR is stable. No change in recs.

## 2012-01-28 LAB — CBC
HCT: 28 % — ABNORMAL LOW (ref 36.0–46.0)
MCHC: 27.9 g/dL — ABNORMAL LOW (ref 30.0–36.0)
MCV: 72.7 fL — ABNORMAL LOW (ref 78.0–100.0)
Platelets: 308 10*3/uL (ref 150–400)
RDW: 22.1 % — ABNORMAL HIGH (ref 11.5–15.5)

## 2012-01-28 LAB — BASIC METABOLIC PANEL
BUN: 36 mg/dL — ABNORMAL HIGH (ref 6–23)
CO2: 25 mEq/L (ref 19–32)
Calcium: 9.1 mg/dL (ref 8.4–10.5)
Chloride: 107 mEq/L (ref 96–112)
Creatinine, Ser: 1.81 mg/dL — ABNORMAL HIGH (ref 0.50–1.10)

## 2012-01-28 LAB — GLUCOSE, CAPILLARY
Glucose-Capillary: 90 mg/dL (ref 70–99)
Glucose-Capillary: 122 mg/dL — ABNORMAL HIGH (ref 70–99)
Glucose-Capillary: 130 mg/dL — ABNORMAL HIGH (ref 70–99)

## 2012-01-28 MED ORDER — LEVOFLOXACIN 500 MG PO TABS
500.0000 mg | ORAL_TABLET | Freq: Every day | ORAL | Status: DC
  Administered 2012-01-28 – 2012-01-29 (×2): 500 mg via ORAL
  Filled 2012-01-28 (×3): qty 1

## 2012-01-28 NOTE — Progress Notes (Signed)
@   Subjective:  Denies CP or dyspnea; edema improving   Objective:  Filed Vitals:   01/27/12 0557 01/27/12 1400 01/27/12 2058 01/28/12 0434  BP: 135/65 120/69 119/57 138/68  Pulse: 83 86 83 86  Temp: 98.5 F (36.9 C) 97.6 F (36.4 C) 98.1 F (36.7 C) 98 F (36.7 C)  TempSrc: Oral Oral Oral Oral  Resp: 18 16 20 22   Height:      Weight: 381 lb 11.2 oz (173.138 kg)   357 lb 3.2 oz (162.025 kg)  SpO2: 97% 98% 94% 96%    Intake/Output from previous day:  Intake/Output Summary (Last 24 hours) at 01/28/12 1146 Last data filed at 01/28/12 0941  Gross per 24 hour  Intake   1028 ml  Output   3975 ml  Net  -2947 ml    Physical Exam: Physical exam: Well-developed morbidly obese in no acute distress.  Skin is warm and dry.  HEENT is normal.  Neck is supple. No thyromegaly.  Chest is clear to auscultation with normal expansion.  Cardiovascular exam is regular rate and rhythm.  Abdominal exam nontender or distended. No masses palpated. Edema in abdominal wall Extremities show trace to 1+ edema. neuro grossly intact    Lab Results: Basic Metabolic Panel:  Basename 01/28/12 0640 01/27/12 0500  NA 141 138  K 4.7 4.7  CL 107 106  CO2 25 22  GLUCOSE 79 87  BUN 36* 38*  CREATININE 1.81* 1.93*  CALCIUM 9.1 8.8  MG -- --  PHOS -- --   CBC:  Basename 01/28/12 0640 01/27/12 0500  WBC 12.2* 12.7*  NEUTROABS -- --  HGB 7.8* 8.0*  HCT 28.0* 28.5*  MCV 72.7* 73.5*  PLT 308 339     Assessment/Plan:  1) Acute on chronic systolic CHF - Still with volume overload. Continue lasix and follow renal function. Continue hydralazine/nitrates, coreg. No ACEI given renal insufficiency. Titrate meds as tolerated by BP. Not clear she will be compliant with medications and recommendations 2) Renal insuff - follow renal function Other issues per primary care. [name] 01/28/2012, 11:46 AM

## 2012-01-28 NOTE — Progress Notes (Signed)
Patient ID: [name]    [id number]    DOB: [birth year]    DOA: 01/19/2012  PCP: [name], MD, MD  Subjective: Dyspnea improving, diuresing well (357 lbs today <- 402lbs),  wants Port-A-Cath removed prior to the discharge  Objective: Weight change: -8.575 kg (-18 lb 14.5 oz)  Intake/Output Summary (Last 24 hours) at 01/28/12 1746 Last data filed at 01/28/12 1445  Gross per 24 hour  Intake   1604 ml  Output   5275 ml  Net  -3671 ml   Blood pressure 137/66, pulse 84, temperature 98.7 F (37.1 C), temperature source Oral, resp. rate 20, height 5\' 7"  (1.702 m), weight 162.025 kg (357 lb 3.2 oz), last menstrual period 07/26/2011, SpO2 96.00%.  Physical Exam: General: Alert and awake, oriented x3, not in any acute distress. HEENT: anicteric sclera, pupils reactive to light and accommodation, EOMI CVS: S1-S2 clear, no murmur rubs or gallops Chest: Difficult to auscultate breath sounds due to body habitus, no wheezing no rhonchi or rales, bilaterally Abdomen: Morbidly obese, soft nontender, nondistended, normal bowel sounds, no organomegaly Extremities: 2 pitting edema bilaterally Neuro: Cranial nerves II-XII intact, no focal neurological deficits  Lab Results: Basic Metabolic Panel:  Lab [id number] 0640 01/27/12 0500  NA 141 138  K 4.7 4.7  CL 107 106  CO2 25 22  GLUCOSE 79 87  BUN 36* 38*  CREATININE 1.81* 1.93*  CALCIUM 9.1 8.8  MG -- --  PHOS -- --   CBC:  Lab 01/28/12 0640 01/27/12 0500  WBC 12.2* 12.7*  NEUTROABS -- --  HGB 7.8* 8.0*  HCT 28.0* 28.5*  MCV 72.7* 73.5*  PLT 308 339   Cardiac Enzymes: No results found for this basename: CKTOTAL:3,CKMB:3,CKMBINDEX:3,TROPONINI:3 in the last 168 hours BNP: No components found with this basename: POCBNP:2 CBG:  Lab 01/28/12 1621 01/28/12 1058 01/28/12 0636 01/27/12 2052 01/27/12 1631  GLUCAP 130* 122* 90 122* 147*     Micro Results: Recent Results (from the past 240 hour(s))  URINE CULTURE     Status:  Normal   Collection Time   01/19/12  6:56 PM      Component Value Range Status Comment   Specimen Description URINE, CATHETERIZED   Final    Special Requests NONE   Final    Culture  Setup Time [id number]   Final    Colony Count NO GROWTH   Final    Culture NO GROWTH   Final    Report Status 01/21/2012 FINAL   Final   URINE CULTURE     Status: Normal   Collection Time   01/20/12  3:48 PM      Component Value Range Status Comment   Specimen Description URINE, CATHETERIZED   Final    Special Requests NONE   Final    Culture  Setup Time [id number]   Final    Colony Count >=100,000 COLONIES/ML   Final    Culture ESCHERICHIA COLI   Final    Report Status 01/23/2012 FINAL   Final    Organism ID, Bacteria ESCHERICHIA COLI   Final   WOUND CULTURE     Status: Normal   Collection Time   01/21/12  5:32 PM      Component Value Range Status Comment   Specimen Description WOUND GROIN   Final    Special Requests NONE   Final    Gram Stain     Final    Value: NO WBC SEEN     FEW SQUAMOUS  EPITHELIAL CELLS PRESENT     ABUNDANT GRAM VARIABLE ROD     MODERATE GRAM POSITIVE COCCI IN PAIRS   Culture FEW STREPTOCOCCUS GROUP G   Final    Report Status 01/22/2012 FINAL   Final     Studies/Results: Dg Chest 1 View  01/21/2012  *RADIOLOGY REPORT*  Clinical Data: CHF.  CHEST - 1 VIEW  Comparison: 01/19/2012  Findings: Study is severely limited.  Study was done as a cross- table decubitus view due to the patient's condition and size. There appears to be cardiomegaly.  Lung fields are not visualized well enough to comment on.  IMPRESSION: Severely limited study as above.  Original Report Authenticated By: Raelyn Number, M.D.   Dg Chest Port 1 View  01/19/2012    IMPRESSION: Suboptimal examination due to patient rotation and portable technique.  Stable cardiomegaly.  Pulmonary venous hypertension.  Repeat imaging on the fixed radiographic unit would be suggested when the patient is clinically able.   Original Report Authenticated By: Deniece Portela, M.D.    Medications: Scheduled Meds:    . aspirin EC  81 mg Oral Daily  . carvedilol  3.125 mg Oral BID WC  . cefTRIAXone (ROCEPHIN)  IV  1 g Intravenous Q24H  . DULoxetine  60 mg Oral Daily  . ferrous sulfate  325 mg Oral BID WC  . folic acid  1 mg Oral Daily  . furosemide  40 mg Intravenous Q8H  . hydrALAZINE  25 mg Oral Q8H  . insulin aspart  0-5 Units Subcutaneous QHS  . insulin aspart  0-9 Units Subcutaneous TID WC  . insulin aspart protamine-insulin aspart  15 Units Subcutaneous Q supper  . insulin aspart protamine-insulin aspart  30 Units Subcutaneous Q breakfast  . isosorbide mononitrate  30 mg Oral Daily  . levothyroxine  25 mcg Oral Q breakfast  . simvastatin  20 mg Oral q1800  . sodium polystyrene  30 g Oral Once  . vancomycin  1,250 mg Intravenous Q24H  . ascorbic acid  500 mg Oral BID  . zinc sulfate  220 mg Oral Daily  . DISCONTD: enoxaparin  40 mg Subcutaneous Daily   Continuous Infusions:    Assessment/Plan:  *Acute on chronic Systolic congestive heart failure: slight clinical improvement, but significant per edema  - Cont IV lasix ,  Creatinine stable, diuresing well, not sure she will be compliant with PO Lasix/meds after discharge - Daily BMET, last echo in January 2011, EF of 35-40%, patient refuses repeat echocardiogram  - Continue daily weights and I's and O's, appreciate heart failure team following and recommendations  Active Problems:  Hydradenitis Suppurative : Cultures suggesting strept G. - currently in active flare, discussed with Dr. Drucilla Schmidt, initially suggested PO antibiotics, however patient is more in favor of IV antibiotics states doxycycline does not work for her - Dr Tommy Medal has recommended placing picc line and to continue Vanc and Levaquin for another month at discharge, will follow in office.    Escherichia coli UTI  - Per sensitivities, completed course of Rocephin. DC'd Rocephin     HTN (hypertension)  - remains stable   DM type 2 (diabetes mellitus, type 2) uncontrolled, A1c 8.5  Iron deficiency anemia  - H&H stable for now     Hyperkalemia: Resolved  Chronic kidney disease (CKD), stage III (moderate)  -Creatinine stable, holding at 1.9  DVT Prophylaxis: Lovenox  Disposition: Wants Port-A-Cath removed, will DC Lovenox, obtained PT/INR in a.m, n.p.o. after midnight will talk  to IR in a.m. for Port-A-Cath removal (consult placed). Then will have to place a PICC for home IV antibiotics.    LOS: 9 days   Yuli Lanigan M.D. [facility] 01/28/2012, 5:46 PM Pager: [phone]

## 2012-01-29 ENCOUNTER — Inpatient Hospital Stay (HOSPITAL_COMMUNITY): Payer: BC Managed Care – PPO

## 2012-01-29 ENCOUNTER — Encounter (HOSPITAL_COMMUNITY): Payer: Self-pay | Admitting: Radiology

## 2012-01-29 LAB — BASIC METABOLIC PANEL
Calcium: 8.9 mg/dL (ref 8.4–10.5)
Creatinine, Ser: 1.74 mg/dL — ABNORMAL HIGH (ref 0.50–1.10)
GFR calc Af Amer: 38 mL/min — ABNORMAL LOW (ref >90)
GFR calc non Af Amer: 33 mL/min — ABNORMAL LOW (ref >90)
Sodium: 139 mEq/L (ref 135–145)

## 2012-01-29 LAB — GLUCOSE, CAPILLARY: Glucose-Capillary: 150 mg/dL — ABNORMAL HIGH (ref 70–99)

## 2012-01-29 LAB — CBC
MCV: 72.9 fL — ABNORMAL LOW (ref 78.0–100.0)
Platelets: 317 10*3/uL (ref 150–400)
RDW: 22.5 % — ABNORMAL HIGH (ref 11.5–15.5)
WBC: 12.5 10*3/uL — ABNORMAL HIGH (ref 4.0–10.5)

## 2012-01-29 LAB — PROTIME-INR
INR: 1.43 (ref 0.00–1.49)
Prothrombin Time: 17.7 seconds — ABNORMAL HIGH (ref 11.6–15.2)

## 2012-01-29 MED ORDER — FUROSEMIDE 40 MG PO TABS: 60.0000 mg | ORAL_TABLET | Freq: Every day | ORAL | Status: DC

## 2012-01-29 MED ORDER — SODIUM CHLORIDE 0.9 % IJ SOLN
10.0000 mL | INTRAMUSCULAR | Status: DC | PRN
  Administered 2012-01-29 (×3): 10 mL
  Administered 2012-01-30: 20 mL

## 2012-01-29 MED ORDER — FUROSEMIDE 40 MG PO TABS: 60.0000 mg | ORAL_TABLET | Freq: Two times a day (BID) | ORAL | Status: DC

## 2012-01-29 MED ORDER — MIDAZOLAM HCL 5 MG/5ML IJ SOLN
INTRAMUSCULAR | Status: AC | PRN
  Administered 2012-01-29: 1 mg via INTRAVENOUS

## 2012-01-29 MED ORDER — VANCOMYCIN HCL 1000 MG IV SOLR
1750.0000 mg | INTRAVENOUS | Status: DC
  Filled 2012-01-29: qty 1750

## 2012-01-29 MED ORDER — SODIUM CHLORIDE 0.9 % IV SOLN
INTRAVENOUS | Status: AC | PRN
  Administered 2012-01-29: 30 mL via INTRAVENOUS

## 2012-01-29 MED ORDER — MIDAZOLAM HCL 2 MG/2ML IJ SOLN
INTRAMUSCULAR | Status: AC
  Filled 2012-01-29: qty 4

## 2012-01-29 MED ORDER — FUROSEMIDE 10 MG/ML IJ SOLN
40.0000 mg | Freq: Three times a day (TID) | INTRAMUSCULAR | Status: DC
  Administered 2012-01-29 – 2012-01-30 (×2): 40 mg via INTRAVENOUS
  Filled 2012-01-29 (×5): qty 4

## 2012-01-29 MED ORDER — FENTANYL CITRATE 0.05 MG/ML IJ SOLN
INTRAMUSCULAR | Status: AC
  Filled 2012-01-29: qty 4

## 2012-01-29 MED ORDER — HYDRALAZINE HCL 25 MG PO TABS
37.5000 mg | ORAL_TABLET | Freq: Three times a day (TID) | ORAL | Status: DC
  Administered 2012-01-29 – 2012-01-30 (×4): 37.5 mg via ORAL
  Filled 2012-01-29 (×6): qty 1.5

## 2012-01-29 MED ORDER — VANCOMYCIN HCL 1000 MG IV SOLR: 1750.0000 mg | INTRAVENOUS | Status: DC

## 2012-01-29 NOTE — ED Notes (Signed)
O2 2L/[place] started. Pt feels sob when lying flat.  Better with O2 per pt.  Sat 98% r/a prior.

## 2012-01-29 NOTE — Progress Notes (Signed)
Physical Therapy Patient Details Name: [name] MRN: [id number] DOB: March 26, 1961 Today's Date: 01/29/2012  Pt OOF in IR this am.  Will try another time as time allows.    Catarina Hartshorn, [place] 01/29/2012, 12:14 PM

## 2012-01-29 NOTE — Progress Notes (Addendum)
ANTIBIOTIC CONSULT NOTE - FOLLOW UP  Pharmacy Consult for Vancomycin Indication: Skin Abscess  Allergies  Allergen Reactions  . Augmentin Diarrhea  . Rosiglitazone Maleate     REACTION: swelling  . Amoxicillin Rash    Patient Measurements: Height: 5\' 7"  (170.2 cm) Weight: 357 lb 5.9 oz (162.1 kg) IBW/kg (Calculated) : 61.6    Vital Signs: Temp: 98.2 F (36.8 C) (03/04 2216) Temp src: Oral (03/04 2216) BP: 127/65 mmHg (03/04 2216) Pulse Rate: 82  (03/04 2216)  Intake/Output from previous day: 03/03 0701 - 03/04 0700 In: 1098 [P.O.:840; IV Piggyback:258] Out: [id number] [Urine:5325]  Intake/Output from this shift:    Labs:  Basename 01/29/12 0533 01/28/12 0640 01/27/12 0500  WBC 12.5* 12.2* 12.7*  HGB 8.0* 7.8* 8.0*  PLT 317 308 339  LABCREA -- -- --  CREATININE 1.74* 1.81* 1.93*   Estimated Creatinine Clearance: 62.2 ml/min (by C-G formula based on Cr of 1.74).   Assessment: 51 yo female on vancomycin for hydradenitis suppurative and current trough is 26.4  Goal of Therapy:  Vancomycin trough level 10 - 15 mcg/ml  Plan:  -Will change Vancomycin to 1750mg  IV q48hr -Will start next dose in 24 hours at 11:00pm  Dareen Piano, Pharm.D. 01/29/2012 11:22 PM

## 2012-01-29 NOTE — Progress Notes (Signed)
ANTIBIOTIC CONSULT NOTE - FOLLOW UP  Pharmacy Consult for Vancomycin Indication: Skin Abscess  Allergies  Allergen Reactions  . Augmentin Diarrhea  . Rosiglitazone Maleate     REACTION: swelling  . Amoxicillin Rash    Patient Measurements: Height: 5\' 7"  (170.2 cm) Weight: 357 lb 5.9 oz (162.1 kg) IBW/kg (Calculated) : 61.6    Vital Signs: Temp: 98.6 F (37 C) (03/04 1250) Temp src: Oral (03/04 0508) BP: 126/84 mmHg (03/04 1250) Pulse Rate: 86  (03/04 1250)  Intake/Output from previous day: 03/03 0701 - 03/04 0700 In: 1098 [P.O.:840; IV Piggyback:258] Out: [id number] [Urine:5325]  Intake/Output from this shift: Total I/O In: 60 [P.O.:60] Out: 2300 [Urine:2300]  Labs:  Basename 01/29/12 0533 01/28/12 0640 01/27/12 0500  WBC 12.5* 12.2* 12.7*  HGB 8.0* 7.8* 8.0*  PLT 317 308 339  LABCREA -- -- --  CREATININE 1.74* 1.81* 1.93*   Estimated Creatinine Clearance: 62.2 ml/min (by C-G formula based on Cr of 1.74).  Microbiology: Recent Results (from the past 720 hour(s))  URINE CULTURE     Status: Normal   Collection Time   01/19/12  6:56 PM      Component Value Range Status Comment   Specimen Description URINE, CATHETERIZED   Final    Special Requests NONE   Final    Culture  Setup Time [id number]   Final    Colony Count NO GROWTH   Final    Culture NO GROWTH   Final    Report Status 01/21/2012 FINAL   Final   URINE CULTURE     Status: Normal   Collection Time   01/20/12  3:48 PM      Component Value Range Status Comment   Specimen Description URINE, CATHETERIZED   Final    Special Requests NONE   Final    Culture  Setup Time [id number]   Final    Colony Count >=100,000 COLONIES/ML   Final    Culture ESCHERICHIA COLI   Final    Report Status 01/23/2012 FINAL   Final    Organism ID, Bacteria ESCHERICHIA COLI   Final   WOUND CULTURE     Status: Normal   Collection Time   01/21/12  5:32 PM      Component Value Range Status Comment   Specimen Description WOUND  GROIN   Final    Special Requests NONE   Final    Gram Stain     Final    Value: NO WBC SEEN     FEW SQUAMOUS EPITHELIAL CELLS PRESENT     ABUNDANT GRAM VARIABLE ROD     MODERATE GRAM POSITIVE COCCI IN PAIRS   Culture FEW STREPTOCOCCUS GROUP G   Final    Report Status 01/22/2012 FINAL   Final     Anti-infectives     Start     Dose/Rate Route Frequency Ordered Stop   01/28/12 1800   levofloxacin (LEVAQUIN) tablet 500 mg        500 mg Oral Daily 01/28/12 1750     01/24/12 1830   cefTRIAXone (ROCEPHIN) 1 g in dextrose 5 % 50 mL IVPB  Status:  Discontinued        1 g 100 mL/hr over 30 Minutes Intravenous Every 24 hours 01/24/12 1820 01/28/12 1750   01/23/12 2300   vancomycin (VANCOCIN) 1,250 mg in sodium chloride 0.9 % 250 mL IVPB        1,250 mg 166.7 mL/hr over 90 Minutes Intravenous Every 24 hours 01/23/12  2232     01/20/12 2200   vancomycin (VANCOCIN) 2,000 mg in sodium chloride 0.9 % 500 mL IVPB  Status:  Discontinued        2,000 mg 250 mL/hr over 120 Minutes Intravenous Every 24 hours 01/20/12 2051 01/23/12 2232   01/20/12 1600   levofloxacin (LEVAQUIN) IVPB 500 mg  Status:  Discontinued        500 mg 100 mL/hr over 60 Minutes Intravenous Every 24 hours 01/20/12 1518 01/24/12 1820          Assessment:  Patient is a 51 y/o female currently receiving antibiotics, Vancomycin and Levaquin, for Hydradenitis Suppurative and E.Coli UTI.  S/P PICC placement for IV Vancomycin x 1 month upon discharge.  Goal of Therapy:   Vancomycin trough level 10 - 15 mcg/ml  Plan:   Vancomycin trough level prior to tonight's dose.  Will adjust dose if required.   Zienna Ahlin, Craig Guess, Pharm.D. 01/29/2012 2:15 PM

## 2012-01-29 NOTE — Progress Notes (Signed)
Patient ID: [name]    [id number]    DOB: [birth year]    DOA: 01/19/2012  PCP: [name], MD, MD  Subjective: Dyspnea improving, diuresing well (357 lbs today <- 402lbs),  wants Port-A-Cath removed prior to the discharge  Objective: Weight change: 0.075 kg (2.7 oz)  Intake/Output Summary (Last 24 hours) at 01/29/12 1921 Last data filed at 01/29/12 1800  Gross per 24 hour  Intake   1042 ml  Output   5975 ml  Net  -4933 ml   Blood pressure 126/73, pulse 94, temperature 98.6 F (37 C), temperature source Oral, resp. rate 18, height 5\' 7"  (1.702 m), weight 162.1 kg (357 lb 5.9 oz), last menstrual period 12/01/2011, SpO2 97.00%.  Physical Exam: General: Alert and awake, oriented x3, not in any acute distress. HEENT: anicteric sclera, pupils reactive to light and accommodation, EOMI CVS: S1-S2 clear, no murmur rubs or gallops Chest: Difficult to auscultate breath sounds due to body habitus, no wheezing no rhonchi or rales, bilaterally Abdomen: Morbidly obese, soft nontender, nondistended, normal bowel sounds, no organomegaly Extremities: 2 pitting edema bilaterally Neuro: Cranial nerves II-XII intact, no focal neurological deficits  Lab Results: Basic Metabolic Panel:  Lab 0000000 0533 01/28/12 0640  NA 139 141  K 4.2 4.7  CL 105 107  CO2 26 25  GLUCOSE 66* 79  BUN 34* 36*  CREATININE 1.74* 1.81*  CALCIUM 8.9 9.1  MG -- --  PHOS -- --   CBC:  Lab 01/29/12 0533 01/28/12 0640  WBC 12.5* 12.2*  NEUTROABS -- --  HGB 8.0* 7.8*  HCT 28.5* 28.0*  MCV 72.9* 72.7*  PLT 317 308   Cardiac Enzymes: No results found for this basename: CKTOTAL:3,CKMB:3,CKMBINDEX:3,TROPONINI:3 in the last 168 hours BNP: No components found with this basename: POCBNP:2 CBG:  Lab 01/29/12 1645 01/29/12 1242 01/29/12 0614 01/28/12 2303 01/28/12 1621  GLUCAP 150* 70 74 113* 130*     Micro Results: Recent Results (from the past 240 hour(s))  URINE CULTURE     Status: Normal   Collection Time   01/20/12  3:48 PM      Component Value Range Status Comment   Specimen Description URINE, CATHETERIZED   Final    Special Requests NONE   Final    Culture  Setup Time [id number]   Final    Colony Count >=100,000 COLONIES/ML   Final    Culture ESCHERICHIA COLI   Final    Report Status 01/23/2012 FINAL   Final    Organism ID, Bacteria ESCHERICHIA COLI   Final   WOUND CULTURE     Status: Normal   Collection Time   01/21/12  5:32 PM      Component Value Range Status Comment   Specimen Description WOUND GROIN   Final    Special Requests NONE   Final    Gram Stain     Final    Value: NO WBC SEEN     FEW SQUAMOUS EPITHELIAL CELLS PRESENT     ABUNDANT GRAM VARIABLE ROD     MODERATE GRAM POSITIVE COCCI IN PAIRS   Culture FEW STREPTOCOCCUS GROUP G   Final    Report Status 01/22/2012 FINAL   Final     Studies/Results: Dg Chest 1 View  01/21/2012  *RADIOLOGY REPORT*  Clinical Data: CHF.  CHEST - 1 VIEW  Comparison: 01/19/2012  Findings: Study is severely limited.  Study was done as a cross- table decubitus view due to the patient's condition and size. There  appears to be cardiomegaly.  Lung fields are not visualized well enough to comment on.  IMPRESSION: Severely limited study as above.  Original Report Authenticated By: Raelyn Number, M.D.   Dg Chest Port 1 View  01/19/2012    IMPRESSION: Suboptimal examination due to patient rotation and portable technique.  Stable cardiomegaly.  Pulmonary venous hypertension.  Repeat imaging on the fixed radiographic unit would be suggested when the patient is clinically able.  Original Report Authenticated By: Deniece Portela, M.D.    Medications: Scheduled Meds:    . aspirin EC  81 mg Oral Daily  . carvedilol  3.125 mg Oral BID WC  . DULoxetine  60 mg Oral Daily  . ferrous sulfate  325 mg Oral BID WC  . folic acid  1 mg Oral Daily  . furosemide  60 mg Oral BID  . hydrALAZINE  37.5 mg Oral Q8H  . insulin aspart  0-5 Units  Subcutaneous QHS  . insulin aspart  0-9 Units Subcutaneous TID WC  . insulin aspart protamine-insulin aspart  15 Units Subcutaneous Q supper  . insulin aspart protamine-insulin aspart  30 Units Subcutaneous Q breakfast  . isosorbide mononitrate  30 mg Oral Daily  . levofloxacin  500 mg Oral Daily  . levothyroxine  25 mcg Oral Q breakfast  . simvastatin  20 mg Oral q1800  . sodium polystyrene  30 g Oral Once  . vancomycin  1,250 mg Intravenous Q24H  . ascorbic acid  500 mg Oral BID  . zinc sulfate  220 mg Oral Daily  . DISCONTD: furosemide  40 mg Intravenous Q8H  . DISCONTD: furosemide  60 mg Oral Daily  . DISCONTD: hydrALAZINE  25 mg Oral Q8H   Continuous Infusions:    Assessment/Plan:  *Acute on chronic Systolic congestive heart failure: slight clinical improvement, but significant per edema  - Cont IV lasix , Creatinine stable, diuresing well, not sure she will be compliant with PO Lasix/meds after discharge - Daily BMET, last echo in January 2011, EF of 35-40%, patient refuses repeat echocardiogram  - Continue daily weights and I's and O's, appreciate heart failure team following and recommended to continue IV Lasix today, I will discuss with Dr. Haroldine Laws in a.m. if Lasix can be transitioned to PO as patient desires to be discharged tomorrow   Active Problems:  Hydradenitis Suppurative : Cultures suggesting strept G. - currently in active flare, discussed with Dr. Drucilla Schmidt, initially suggested PO antibiotics, however patient is more in favor of IV antibiotics states doxycycline does not work for her - Dr Tommy Medal has recommended placing picc line and to continue Vanc and Levaquin for another month at discharge, will follow in office.   - Port-A-Cath removed today and PICC line placed by IR for IV vancomycin.  Escherichia coli UTI  - Per sensitivities, completed course of Rocephin. DC'd Rocephin   HTN (hypertension)  - remains stable   DM type 2 (diabetes mellitus, type 2)  uncontrolled, A1c 8.5  Iron deficiency anemia  - H&H stable for now     Hyperkalemia: Resolved  Chronic kidney disease (CKD), stage III (moderate)  -Creatinine stable, holding at 1.9  DVT Prophylaxis: Lovenox  Disposition:  Port-A-Cath removed today, PICC line placed, will arrange vancomycin via case management, hopefully transition to by mouth Lasix tomorrow if okay by cardiology (as patient desires to be discharged tomorrow).    LOS: 10 days   Kameron Glazebrook M.D. [facility] 01/29/2012, 7:21 PM Pager: [phone]

## 2012-01-29 NOTE — H&P (Signed)
[name] is an 51 y.o. female.   Chief Complaint: Hidradenitis suppurativa; UTI Port a cath placed 3/12; continued problem with accessing port; port has not been used x over 6 months now. Has existing IV and tentatively scheduled for PICC before dc to home soon. HPI: Pt wants PAC removed because always difficult to access. MD has ordered removal before dc.  Past Medical History  Diagnosis Date  . Systolic congestive heart failure   . SOB (shortness of breath)   . HTN (hypertension)   . DM type 2 (diabetes mellitus, type 2)   . Chronic kidney disease (CKD), stage III (moderate)   . Iron deficiency anemia   . Morbid obesity   . Hyperkalemia   . Hypothyroidism   . CHF (congestive heart failure)     Past Surgical History  Procedure Date  . Portacath placement     Family History  Problem Relation Age of Onset  . Coronary artery disease Mother   . Hypertension Mother   . Diabetes type II Mother   . Malignant hyperthermia Mother   . Coronary artery disease Father   . Hypertension Father   . Malignant hyperthermia Father   . Cancer Maternal Grandfather     ? Type   Social History:  reports that she has never smoked. She does not have any smokeless tobacco history on file. She reports that she does not drink alcohol or use illicit drugs.  Allergies:  Allergies  Allergen Reactions  . Augmentin Diarrhea  . Rosiglitazone Maleate     REACTION: swelling  . Amoxicillin Rash    Medications Prior to Admission  Medication Dose Route Frequency Provider Last Rate Last Dose  . acetaminophen (TYLENOL) tablet 650 mg  650 mg Oral Q4H PRN Theressa Millard, MD   650 mg at 01/28/12 2023  . aspirin EC tablet 81 mg  81 mg Oral Daily Wynetta Emery, PA   81 mg at 01/28/12 0946  . carvedilol (COREG) tablet 3.125 mg  3.125 mg Oral BID WC Theressa Millard, MD   3.125 mg at 01/29/12 0757  . DULoxetine (CYMBALTA) DR capsule 60 mg  60 mg Oral Daily Theressa Millard, MD   60 mg at 01/28/12  0945  . ferrous sulfate tablet 325 mg  325 mg Oral BID WC Theressa Millard, MD   325 mg at 01/29/12 0752  . folic acid (FOLVITE) tablet 1 mg  1 mg Oral Daily Theressa Millard, MD   1 mg at 01/28/12 0945  . furosemide (LASIX) injection 40 mg  40 mg Intravenous BID Ripudeep K Rai, MD   40 mg at 01/25/12 0930  . furosemide (LASIX) injection 40 mg  40 mg Intravenous Q8H Wynetta Emery, PA   40 mg at 01/29/12 0630  . furosemide (LASIX) injection 80 mg  80 mg Intravenous Once Janice Norrie, MD   80 mg at 01/19/12 2059  . hydrALAZINE (APRESOLINE) tablet 37.5 mg  37.5 mg Oral Q8H Daniel Karlyn Agee, MD      . insulin aspart (novoLOG) injection 0-5 Units  0-5 Units Subcutaneous QHS Harvette C Jenkins, MD      . insulin aspart (novoLOG) injection 0-9 Units  0-9 Units Subcutaneous TID WC Theressa Millard, MD   1 Units at 01/28/12 1730  . insulin aspart protamine-insulin aspart (NOVOLOG 70/30) injection 15 Units  15 Units Subcutaneous Q supper Ripudeep Krystal Eaton, MD   15 Units at 01/28/12 1729  . insulin aspart  protamine-insulin aspart (NOVOLOG 70/30) injection 30 Units  30 Units Subcutaneous Q breakfast Ripudeep Krystal Eaton, MD   30 Units at 01/28/12 0847  . isosorbide mononitrate (IMDUR) 24 hr tablet 30 mg  30 mg Oral Daily Wynetta Emery, PA   30 mg at 01/28/12 0946  . levofloxacin (LEVAQUIN) tablet 500 mg  500 mg Oral Daily Ripudeep Krystal Eaton, MD   500 mg at 01/28/12 1859  . levothyroxine (SYNTHROID, LEVOTHROID) tablet 25 mcg  25 mcg Oral Q breakfast Theressa Millard, MD   25 mcg at 01/29/12 0752  . nitroGLYCERIN (NITROGLYN) 2 % ointment 1 inch  1 inch Topical Once Janice Norrie, MD   1 inch at 01/19/12 2106  . ondansetron (ZOFRAN) injection 4 mg  4 mg Intravenous Q6H PRN Theressa Millard, MD      . oxyCODONE-acetaminophen (PERCOCET) 5-325 MG per tablet 1 tablet  1 tablet Oral Q6H PRN Mary A. Donnal Debar, NP   1 tablet at 01/29/12 0756  . simvastatin (ZOCOR) tablet 20 mg  20 mg Oral q1800 Theressa Millard, MD   20  mg at 01/28/12 1728  . sodium polystyrene (KAYEXALATE) 15 GM/60ML suspension 30 g  30 g Oral Once Iskra Magick-Myers, MD      . vancomycin (VANCOCIN) 1,250 mg in sodium chloride 0.9 % 250 mL IVPB  1,250 mg Intravenous Q24H Iskra Magick-Myers, MD   1,250 mg at 01/29/12 0115  . vitamin C (ASCORBIC ACID) tablet 500 mg  500 mg Oral BID Theressa Millard, MD   500 mg at 01/28/12 2321  . zinc sulfate capsule 220 mg  220 mg Oral Daily Theressa Millard, MD   220 mg at 01/28/12 0946  . DISCONTD: 0.9 %  sodium chloride infusion  250 mL Intravenous PRN Theressa Millard, MD      . DISCONTD: aspirin EC tablet 325 mg  325 mg Oral Daily Theressa Millard, MD   325 mg at 01/25/12 1059  . DISCONTD: cefTRIAXone (ROCEPHIN) 1 g in dextrose 5 % 50 mL IVPB  1 g Intravenous Q24H Ripudeep K Rai, MD   1 g at 01/28/12 1736  . DISCONTD: enoxaparin (LOVENOX) injection 40 mg  40 mg Subcutaneous Daily Theressa Millard, MD   40 mg at 01/28/12 0944  . DISCONTD: furosemide (LASIX) injection 40 mg  40 mg Intravenous BID Ripudeep Krystal Eaton, MD   40 mg at 01/25/12 1736  . DISCONTD: furosemide (LASIX) tablet 40 mg  40 mg Oral BID Faye Ramsay, MD   40 mg at 01/24/12 [id number]  . DISCONTD: hydrALAZINE (APRESOLINE) tablet 12.5 mg  12.5 mg Oral Q8H Wynetta Emery, PA   12.5 mg at 01/26/12 0649  . DISCONTD: hydrALAZINE (APRESOLINE) tablet 25 mg  25 mg Oral Q8H Wynetta Emery, PA   25 mg at 01/29/12 0752  . DISCONTD: insulin aspart protamine-insulin aspart (NOVOLOG 70/30) injection 20 Units  20 Units Subcutaneous Q supper Theressa Millard, MD   20 Units at 01/24/12 1831  . DISCONTD: insulin aspart protamine-insulin aspart (NOVOLOG 70/30) injection 35 Units  35 Units Subcutaneous Q breakfast Theressa Millard, MD   35 Units at 01/24/12 0830  . DISCONTD: levofloxacin (LEVAQUIN) IVPB 500 mg  500 mg Intravenous Q24H Iskra Magick-Myers, MD   500 mg at 01/24/12 1550  . DISCONTD: levothyroxine (SYNTHROID, LEVOTHROID) tablet 50 mcg  50  mcg Oral QAC breakfast Theressa Millard, MD      . DISCONTD: sodium chloride 0.9 %  injection 3 mL  3 mL Intravenous Q12H Theressa Millard, MD   3 mL at 01/20/12 1119  . DISCONTD: sodium chloride 0.9 % injection 3 mL  3 mL Intravenous PRN Theressa Millard, MD      . DISCONTD: torsemide (DEMADEX) tablet 20 mg  20 mg Oral BID Theressa Millard, MD   20 mg at 01/20/12 0912  . DISCONTD: torsemide (DEMADEX) tablet 20 mg  20 mg Oral BID Faye Ramsay, MD   20 mg at 01/22/12 1137  . DISCONTD: vancomycin (VANCOCIN) 2,000 mg in sodium chloride 0.9 % 500 mL IVPB  2,000 mg Intravenous Q24H Rande Lawman Rumbarger, PHARMD   2,000 mg at 01/22/12 2331   Medications Prior to Admission  Medication Sig Dispense Refill  . ascorbic acid (VITAMIN C) 500 MG tablet Take 500 mg by mouth 2 (two) times daily.        . carvedilol (COREG) 3.125 MG tablet Take 3.125 mg by mouth 2 (two) times daily with a meal. Take one tab by mouth with food twice a day       . DULoxetine (CYMBALTA) 60 MG capsule Take 60 mg by mouth daily.        . ferrous sulfate 325 (65 FE) MG tablet Take 325 mg by mouth 2 (two) times daily.        . furosemide (LASIX) 40 MG tablet 40 mg. Take 2 tablets twice a day      . HYDROcodone-acetaminophen (VICODIN) 5-500 MG per tablet Take 1-2 tablets by mouth every 6 hours for pain       . insulin lispro protamine-insulin lispro (HUMALOG 75/25) (75-25) 100 UNIT/ML SUSP Give 35 units subq every morning and 20 units sub q every PM       . levothyroxine (SYNTHROID, LEVOTHROID) 50 MCG tablet Take 50 mcg by mouth. Each morning on an empty stomach 30 before meal       . magnesium oxide (MAG-OX) 400 MG tablet Take 400 mg by mouth 2 (two) times daily.       Marland Kitchen oxyCODONE-acetaminophen (PERCOCET) 5-325 MG per tablet Take 1 tablet by mouth every 6 (six) hours as needed. For pain      . pravastatin (PRAVACHOL) 40 MG tablet Take 40 mg by mouth daily.        Marland Kitchen spironolactone (ALDACTONE) 25 MG tablet Take 25 mg by  mouth daily.         Results for orders placed during the hospital encounter of 01/19/12 (from the past 48 hour(s))  GLUCOSE, CAPILLARY     Status: Abnormal   Collection Time   01/27/12 11:18 AM      Component Value Range Comment   Glucose-Capillary 153 (*) 70 - 99 (mg/dL)    Comment 1 Documented in Chart      Comment 2 Notify RN     GLUCOSE, CAPILLARY     Status: Abnormal   Collection Time   01/27/12  4:31 PM      Component Value Range Comment   Glucose-Capillary 147 (*) 70 - 99 (mg/dL)    Comment 1 Documented in Chart      Comment 2 Notify RN     GLUCOSE, CAPILLARY     Status: Abnormal   Collection Time   01/27/12  8:52 PM      Component Value Range Comment   Glucose-Capillary 122 (*) 70 - 99 (mg/dL)    Comment 1 Notify RN     GLUCOSE, CAPILLARY  Status: Normal   Collection Time   01/28/12  6:36 AM      Component Value Range Comment   Glucose-Capillary 90  70 - 99 (mg/dL)   BASIC METABOLIC PANEL     Status: Abnormal   Collection Time   01/28/12  6:40 AM      Component Value Range Comment   Sodium 141  135 - 145 (mEq/L)    Potassium 4.7  3.5 - 5.1 (mEq/L)    Chloride 107  96 - 112 (mEq/L)    CO2 25  19 - 32 (mEq/L)    Glucose, Bld 79  70 - 99 (mg/dL)    BUN 36 (*) 6 - 23 (mg/dL)    Creatinine, Ser 1.81 (*) 0.50 - 1.10 (mg/dL)    Calcium 9.1  8.4 - 10.5 (mg/dL)    GFR calc non Af Amer 32 (*) >90 (mL/min)    GFR calc Af Amer 37 (*) >90 (mL/min)   CBC     Status: Abnormal   Collection Time   01/28/12  6:40 AM      Component Value Range Comment   WBC 12.2 (*) 4.0 - 10.5 (K/uL)    RBC 3.85 (*) 3.87 - 5.11 (MIL/uL)    Hemoglobin 7.8 (*) 12.0 - 15.0 (g/dL)    HCT 28.0 (*) 36.0 - 46.0 (%)    MCV 72.7 (*) 78.0 - 100.0 (fL)    MCH 20.3 (*) 26.0 - 34.0 (pg)    MCHC 27.9 (*) 30.0 - 36.0 (g/dL)    RDW 22.1 (*) 11.5 - 15.5 (%)    Platelets 308  150 - 400 (K/uL)   GLUCOSE, CAPILLARY     Status: Abnormal   Collection Time   01/28/12 10:58 AM      Component Value Range Comment    Glucose-Capillary 122 (*) 70 - 99 (mg/dL)    Comment 1 Notify RN     GLUCOSE, CAPILLARY     Status: Abnormal   Collection Time   01/28/12  4:21 PM      Component Value Range Comment   Glucose-Capillary 130 (*) 70 - 99 (mg/dL)    Comment 1 Notify RN     GLUCOSE, CAPILLARY     Status: Abnormal   Collection Time   01/28/12 11:03 PM      Component Value Range Comment   Glucose-Capillary 113 (*) 70 - 99 (mg/dL)    Comment 1 Notify RN      Comment 2 Documented in Chart     BASIC METABOLIC PANEL     Status: Abnormal   Collection Time   01/29/12  5:33 AM      Component Value Range Comment   Sodium 139  135 - 145 (mEq/L)    Potassium 4.2  3.5 - 5.1 (mEq/L)    Chloride 105  96 - 112 (mEq/L)    CO2 26  19 - 32 (mEq/L)    Glucose, Bld 66 (*) 70 - 99 (mg/dL)    BUN 34 (*) 6 - 23 (mg/dL)    Creatinine, Ser 1.74 (*) 0.50 - 1.10 (mg/dL)    Calcium 8.9  8.4 - 10.5 (mg/dL)    GFR calc non Af Amer 33 (*) >90 (mL/min)    GFR calc Af Amer 38 (*) >90 (mL/min)   CBC     Status: Abnormal   Collection Time   01/29/12  5:33 AM      Component Value Range Comment   WBC 12.5 (*) 4.0 -  10.5 (K/uL)    RBC 3.91  3.87 - 5.11 (MIL/uL)    Hemoglobin 8.0 (*) 12.0 - 15.0 (g/dL)    HCT 28.5 (*) 36.0 - 46.0 (%)    MCV 72.9 (*) 78.0 - 100.0 (fL)    MCH 20.5 (*) 26.0 - 34.0 (pg)    MCHC 28.1 (*) 30.0 - 36.0 (g/dL)    RDW 22.5 (*) 11.5 - 15.5 (%)    Platelets 317  150 - 400 (K/uL)   PROTIME-INR     Status: Abnormal   Collection Time   01/29/12  5:33 AM      Component Value Range Comment   Prothrombin Time 17.7 (*) 11.6 - 15.2 (seconds)    INR 1.43  0.00 - 1.49    GLUCOSE, CAPILLARY     Status: Normal   Collection Time   01/29/12  6:14 AM      Component Value Range Comment   Glucose-Capillary 74  70 - 99 (mg/dL)    No results found.  Review of Systems  Constitutional: Negative for fever.  Cardiovascular: Negative for chest pain.  Gastrointestinal: Negative for nausea and vomiting.  Neurological: Negative for  headaches.    Blood pressure 133/73, pulse 79, temperature 98.6 F (37 C), temperature source Oral, resp. rate 18, height 5\' 7"  (1.702 m), weight 357 lb 5.9 oz (162.1 kg), last menstrual period 07/26/2011, SpO2 98.00%. Physical Exam  Constitutional: She is oriented to person, place, and time. She appears well-developed and well-nourished.  Cardiovascular: Normal rate, regular rhythm and normal heart sounds.   No murmur heard. Respiratory: Effort normal and breath sounds normal.  GI: Soft.       obese  Musculoskeletal: Normal range of motion.  Neurological: She is alert and oriented to person, place, and time.     Assessment/Plan PAC placed [id number] in IR w/o complication.  Has had continued problem accessing port. Has not been used since 8/12.... Pt would like removal. MD has ordered removal and pt is tentatively scheduled for PICC for home use for antibx treatment for hidradenitis and UTI. Home soon. Pt aware of procedure of PAC removal; benefits and risks and agreeable to proceed. Consent signed and in chart.   Lusero Nordlund A 01/29/2012, 8:48 AM

## 2012-01-29 NOTE — ED Notes (Signed)
PAC removal finished.  Redraping for PICC line.  Pt tolerated well.

## 2012-01-29 NOTE — Progress Notes (Signed)
   CARE MANAGEMENT NOTE 01/29/2012  Patient:  [facility]   Account Number:  [id number]  Date Initiated:  01/25/2012  Documentation initiated by:  Lars Pinks  Subjective/Objective Assessment:   PT WAS ADMITTED WITH SOB AND OVERLOAD     Action/Plan:   PROGRESSIO OF CARE AND DISCHARGE PLANNING   Anticipated DC Date:  01/29/2012   Anticipated DC Plan:  [place]  CM consult      Choice offered to / List presented to:     DME arranged  Vassie Moselle      DME agency  [place] arranged  HH-1 RN      [place].   Status of service:  In process, will continue to follow Medicare Important Message given?   (If response is "NO", the following Medicare IM given date fields will be blank) Date Medicare IM given:   Date Additional Medicare IM given:    Discharge Disposition:    Per UR Regulation:  Reviewed for med. necessity/level of care/duration of stay  Comments:  01/29/12 Lars Pinks, RN, BSN 1604 PT WILL GO HOME WITH [facility] WITH IV VANC FOR 1 MO.  PT CONTS TO RECEIVE IV LASIX.  WILL F/U.  UR COMPLETED.  Lars Pinks, RN, BSN 01/25/12 1627 PT WAS ADMITTED WITH SOB AND HF.  PT CONTS TO HAVE FLUID AND IS RECEIVING IV LASIX.  PT REFUSED HH PT/OT AT THIS TIME AS RECOMMENDED.  PT DID AGREE TO A WIDE ROLLING WALKER.  WILL F/U ON DC NEEDS

## 2012-01-29 NOTE — Progress Notes (Signed)
Nutrition Brief Note:   Consult received this weekend for edu, weekend RD unable to complete as patient requested to be seen at a later date. RD received another consult for edu. Patient again requested RD come back at a later date. RD will continue to try and provide CHF education as able.   Orson Slick [place]

## 2012-01-29 NOTE — Procedures (Signed)
Removal of right chest portacath without complication.   Placement of right arm PICC (dual lumen PowerPICC), tip in SVC.  Length = 40 cm.

## 2012-01-29 NOTE — ED Notes (Signed)
PICC insertion complete

## 2012-01-29 NOTE — Progress Notes (Signed)
Advanced Heart Failure Rounding Note   Subjective:    [name] is a 51 y.o. African American female with pertinent history of presumed nonischemic cardiomyopathy with last EF 35-40% in 2011 (no report of cath), chronic renal insufficiency, diabetes mellitus, hypertension, obesity, hypothyroidism and hidradenitis suppurativa.   Echo 11/2009: LVEF 35-40% Akinesis of distal anteroseptal and apical walls. RV normal. Mild MR/TR. pRVSP 46   Remains on IV lasix.  Weight down about 40 pounds this admission.  Breathing improved. Slept well.  No orthopnea/PND.  Continues to diurese well.  Tolerating Hydralazine/imdur.  No ACE-I 2/2 renal failure.  Cr stable  Objective:    Vital Signs:   Temp:  [98.6 F (37 C)-98.9 F (37.2 C)] 98.6 F (37 C) (03/04 0508) Pulse Rate:  [79-84] 79  (03/04 0508) Resp:  [18-20] 18  (03/04 0508) BP: (123-137)/(66-73) 133/73 mmHg (03/04 0508) SpO2:  [96 %-98 %] 98 % (03/04 0508) Weight:  [162.1 kg (357 lb 5.9 oz)] 162.1 kg (357 lb 5.9 oz) (03/04 0508) Last BM Date: 01/26/12   Intake/Output:   Intake/Output Summary (Last 24 hours) at 01/29/12 0812 Last data filed at 01/29/12 0715  Gross per 24 hour  Intake   1098 ml  Output   5625 ml  Net  -4527 ml    Wt 376 lbs  Physical Exam: General: Lying in bed. Massively obese. No resp difficulty  HEENT: normal  Neck: supple. Unable to evaluate JVD. Carotids 2+ bilat; . No lymphadenopathy or thryomegaly appreciated.  Cor: PMI nonpalpabe. Distant heart sounds/ Regular rate & rhythm. No rubs, gallops or murmurs.  Lungs: clear  Abdomen: markedly obese. soft, nontender.Good bowel sounds.  Extremities: no cyanosis, clubbing, rash. 2-3+ woody edema  Neuro: alert & orientedx3, cranial nerves grossly intact. moves all 4 extremities w/o difficulty. Affect pleasant  Telemetry: SR 70-80s with PVCs  Labs: Basic Metabolic Panel:  Lab [id number] 0533 01/28/12 0640 01/27/12 0500 01/26/12 0700 01/24/12 0533  NA 139 141 138  140 138  K 4.2 4.7 4.7 4.9 5.2*  CL 105 107 106 109 107  CO2 26 25 22 22 21   GLUCOSE 66* 79 87 93 82  BUN 34* 36* 38* 40* 41*  CREATININE 1.74* 1.81* 1.93* 1.99* 1.95*  CALCIUM 8.9 9.1 8.8 -- --  MG -- -- -- -- --  PHOS -- -- -- -- --    Liver Function Tests: No results found for this basename: AST:5,ALT:5,ALKPHOS:5,BILITOT:5,PROT:5,ALBUMIN:5 in the last 168 hours No results found for this basename: LIPASE:5,AMYLASE:5 in the last 168 hours No results found for this basename: AMMONIA:3 in the last 168 hours  CBC:  Lab 01/29/12 0533 01/28/12 0640 01/27/12 0500 01/24/12 0533 01/23/12 0600  WBC 12.5* 12.2* 12.7* 14.1* 13.2*  NEUTROABS -- -- -- -- --  HGB 8.0* 7.8* 8.0* 8.1* 7.7*  HCT 28.5* 28.0* 28.5* 29.8* 27.5*  MCV 72.9* 72.7* 73.5* 73.0* 72.4*  PLT 317 308 339 372 371    Cardiac Enzymes: No results found for this basename: CKTOTAL:5,CKMB:5,CKMBINDEX:5,TROPONINI:5 in the last 168 hours  BNP: No components found with this basename: POCBNP:5  CBG:  Lab 01/29/12 0614 01/28/12 2303 01/28/12 1621 01/28/12 1058 01/28/12 0636  GLUCAP 74 113* 130* 122* 90    Coagulation Studies:  Basename 01/29/12 0533  LABPROT 17.7*  INR 1.43     Imaging: No results found.   Medications:     Scheduled Medications:    . aspirin EC  81 mg Oral Daily  . carvedilol  3.125 mg Oral  BID WC  . DULoxetine  60 mg Oral Daily  . ferrous sulfate  325 mg Oral BID WC  . folic acid  1 mg Oral Daily  . furosemide  40 mg Intravenous Q8H  . hydrALAZINE  25 mg Oral Q8H  . insulin aspart  0-5 Units Subcutaneous QHS  . insulin aspart  0-9 Units Subcutaneous TID WC  . insulin aspart protamine-insulin aspart  15 Units Subcutaneous Q supper  . insulin aspart protamine-insulin aspart  30 Units Subcutaneous Q breakfast  . isosorbide mononitrate  30 mg Oral Daily  . levofloxacin  500 mg Oral Daily  . levothyroxine  25 mcg Oral Q breakfast  . simvastatin  20 mg Oral q1800  . sodium polystyrene  30  g Oral Once  . vancomycin  1,250 mg Intravenous Q24H  . ascorbic acid  500 mg Oral BID  . zinc sulfate  220 mg Oral Daily  . DISCONTD: cefTRIAXone (ROCEPHIN)  IV  1 g Intravenous Q24H  . DISCONTD: enoxaparin  40 mg Subcutaneous Daily    Infusions:    PRN Medications: acetaminophen, ondansetron (ZOFRAN) IV, oxyCODONE-acetaminophen   Assessment:   1. Acute on chronic systolic HF  2. NICM, EF 35-40% - refusing repeat echo  3. E. Coli UTI  - on rocephin  4. Acute on chronic renal failure  5. DM2  6. HTN  7. Morbid obesity  8. Medical noncompliance  Plan/Discussion:    Length of Stay: [address] Shelley Haven, PA-C  01/29/2012, 8:12 AM  Patient seen and examined with Shelley Haven PA-C. We discussed all aspects of the encounter. I agree with the assessment and plan as stated above.   Diuresing very well. Renal function stable. Still with volume on board. Continue IV lasix.   Benay Spice 8:33 AM

## 2012-01-30 LAB — BASIC METABOLIC PANEL
Chloride: 103 mEq/L (ref 96–112)
GFR calc Af Amer: 37 mL/min — ABNORMAL LOW (ref >90)
Potassium: 4.2 mEq/L (ref 3.5–5.1)
Sodium: 140 mEq/L (ref 135–145)

## 2012-01-30 MED ORDER — SODIUM CHLORIDE 0.9 % IJ SOLN: 10.0000 mL | INTRAMUSCULAR | Status: DC | PRN

## 2012-01-30 MED ORDER — VANCOMYCIN HCL 1000 MG IV SOLR: 1750.0000 mg | INTRAVENOUS | Status: DC

## 2012-01-30 MED ORDER — HYDRALAZINE HCL 25 MG PO TABS: 37.5000 mg | ORAL_TABLET | Freq: Three times a day (TID) | ORAL | Status: DC

## 2012-01-30 MED ORDER — LEVOFLOXACIN 500 MG PO TABS: 500.0000 mg | ORAL_TABLET | Freq: Every day | ORAL | Status: AC

## 2012-01-30 MED ORDER — ASPIRIN 81 MG PO TBEC: 81.0000 mg | DELAYED_RELEASE_TABLET | Freq: Every day | ORAL | Status: AC

## 2012-01-30 MED ORDER — FUROSEMIDE 20 MG PO TABS: 60.0000 mg | ORAL_TABLET | Freq: Two times a day (BID) | ORAL | Status: DC

## 2012-01-30 MED ORDER — FUROSEMIDE 40 MG PO TABS
60.0000 mg | ORAL_TABLET | Freq: Two times a day (BID) | ORAL | Status: DC
  Filled 2012-01-30 (×2): qty 1

## 2012-01-30 MED ORDER — ISOSORBIDE MONONITRATE ER 30 MG PO TB24: 30.0000 mg | ORAL_TABLET | Freq: Every day | ORAL | Status: DC

## 2012-01-30 NOTE — Discharge Summary (Signed)
Physician Discharge Summary  Patient ID: [name] MRN: [id number] DOB/AGE: 50-Mar-1962 50 y.o.  Admit date: 01/19/2012 Discharge date: 01/30/2012  Primary Care Physician:  [name], MD, MD  Discharge Diagnoses:    . acute on chronic Systolic congestive heart failure .SOB (shortness of breath) secondary to CHF/obesity hypoventilation/nonischemic cardiomyopathy  .HTN (hypertension) .DM type 2 (diabetes mellitus, type 2) .Chronic kidney disease (CKD), stage III (moderate) .Iron deficiency anemia .Hyperkalemia .hidradenitis suppurativa .Leukocytosis Hypothyroidism Morbid obesity  Consults:  1. Cardiology/ heart failure team: Dr. Haroldine Laws  Discharge Medications: Medication List  As of 01/30/2012  5:41 PM   STOP taking these medications         levothyroxine 50 MCG tablet      spironolactone 25 MG tablet         TAKE these medications         ascorbic acid 500 MG tablet   Commonly known as: VITAMIN C   Take 500 mg by mouth 2 (two) times daily.      aspirin 81 MG EC tablet   Take 1 tablet (81 mg total) by mouth daily.      carvedilol 3.125 MG tablet   Commonly known as: COREG   Take 3.125 mg by mouth 2 (two) times daily with a meal. Take one tab by mouth with food twice a day      CYMBALTA 60 MG capsule   Generic drug: DULoxetine   Take 60 mg by mouth daily.      ferrous sulfate 325 (65 FE) MG tablet   Take 325 mg by mouth 2 (two) times daily.      folic acid 1 MG tablet   Commonly known as: FOLVITE   Take 1 mg by mouth daily.      furosemide 20 MG tablet   Commonly known as: LASIX   Take 3 tablets (60 mg total) by mouth 2 (two) times daily.      hydrALAZINE 25 MG tablet   Commonly known as: APRESOLINE   Take 1.5 tablets (37.5 mg total) by mouth 3 (three) times daily.      HYDROcodone-acetaminophen 5-500 MG per tablet   Commonly known as: VICODIN   Take 1-2 tablets by mouth every 6 hours for pain      insulin lispro protamine-insulin lispro  (75-25) 100 UNIT/ML Susp   Commonly known as: HUMALOG 75/25   Give 35 units subq every morning and 20 units sub q every PM      isosorbide mononitrate 30 MG 24 hr tablet   Commonly known as: IMDUR   Take 1 tablet (30 mg total) by mouth daily.      levofloxacin 500 MG tablet   Commonly known as: LEVAQUIN   Take 1 tablet (500 mg total) by mouth daily.      levothyroxine 25 MCG tablet   Commonly known as: SYNTHROID, LEVOTHROID   Take 25 mcg by mouth daily.      magnesium oxide 400 MG tablet   Commonly known as: MAG-OX   Take 400 mg by mouth 2 (two) times daily.      oxyCODONE-acetaminophen 5-325 MG per tablet   Commonly known as: PERCOCET   Take 1 tablet by mouth every 6 (six) hours as needed. For pain      pravastatin 40 MG tablet   Commonly known as: PRAVACHOL   Take 40 mg by mouth daily.      sodium chloride 0.9 % injection   10-40 mLs by Intracatheter route as  needed (flush, please dispense for 1 month).      sodium chloride 0.9 % SOLN 500 mL with vancomycin 1000 MG SOLR 1,750 mg   Inject 1,750 mg into the vein every other day.      zinc sulfate 220 MG capsule   Take 220 mg by mouth daily.             Brief H and P: For complete details please refer to admission H and P, but in brief, patient is a 51 year old female who presented to ED with complaints of worsening dyspnea and edema over the past month prior to admission. She described having orthopnea and dyspnea on exertion, edema bleeding or from her feet and progressing upwards to her chest. She was admitted for acute on chronic CHF.  Hospital Course:  Acute on chronic Systolic congestive heart failure: Patient was placed on aggressive IV diuresis with Lasix, cardiology was consulted and patient was closely followed by heart failure team, Dr. Haroldine Laws. Patient appears to have history of medical and especially dietary noncompliance. She also refused a repeat echocardiogram during this admission, she had significant  improvement in the peripheral edema with IV Lasix diuresis. Lasix was transitioned to by mouth 60 mg twice a day at the time of discharge. Patient was encouraged to lose weight and exercise,  Fluid restriction and home health RN was arranged for IV antibiotics and heart failure. She will followup with heart failure clinic per the appointment   Hydradenitis Suppurative :  appears to have recurrent flares, cultures suggesting strept G. I discussed with Dr. Drucilla Schmidt,  patient was initially placed on IV vancomycin and Levaquin however due to Escherichia coli UTI, Levaquin was discontinued and patient was placed on IV Rocephin. He shouldn't has completed a course of IV Rocephin. She was initially initially suggested PO antibiotics for discharge , however patient is more in favor of IV antibiotics stating doxycycline does not work for her. Port-A-Cath was removed per her request and PICC line was placed. She was discharged on IV vancomycin and by mouth Levaquin for a month to advanced home care. Patient will be followed by Dr. Drucilla Schmidt in the clinic .   Escherichia coli UTI: Per sensitivities,  patient has completed course of Rocephin.   HTN (hypertension): remained stable   Chronic kidney disease (CKD), stage III (moderate):Creatinine stable, holding at 1.7  Day of Discharge BP 136/66  Pulse 86  Temp(Src) 98.4 F (36.9 C) (Oral)  Resp 18  Ht 5\' 7"  (1.702 m)  Wt 162.3 kg (357 lb 12.9 oz)  BMI 56.04 kg/m2  SpO2 98%  LMP 12/01/2011  Physical Exam: General: Alert and awake oriented x3 not in any acute distress. HEENT: anicteric sclera, pupils reactive to light and accommodation CVS: S1-S2 clear no murmur rubs or gallops Chest:  difficult to auscultate breath sounds due to body habitus however no wheezing or rhonchi bilaterally  Abdomen:  morbid obesity soft nontender, nondistended, normal bowel sounds, no organomegaly Extremities: no cyanosis, clubbing or edema noted bilaterally Neuro: Cranial nerves  II-XII intact, no focal neurological deficits   The results of significant diagnostics from this hospitalization (including imaging, microbiology, ancillary and laboratory) are listed below for reference.    LAB RESULTS: Basic Metabolic Panel:  Lab [id number] 01/29/12 0533  NA 140 139  K 4.2 4.2  CL 103 105  CO2 29 26  GLUCOSE 97 66*  BUN 34* 34*  CREATININE 1.78* 1.74*  CALCIUM 8.7 8.9  MG -- --  PHOS -- --  Liver Function Tests: No results found for this basename: AST:2,ALT:2,ALKPHOS:2,BILITOT:2,PROT:2,ALBUMIN:2 in the last 168 hours No results found for this basename: LIPASE:2,AMYLASE:2 in the last 168 hours No results found for this basename: AMMONIA:2 in the last 168 hours CBC:  Lab 01/29/12 0533 01/28/12 0640  WBC 12.5* 12.2*  NEUTROABS -- --  HGB 8.0* 7.8*  HCT 28.5* 28.0*  MCV 72.9* --  PLT 317 308   CBG:  Lab 01/30/12 1133 01/30/12 0637  GLUCAP 109* 105*    Significant Diagnostic Studies:  Dg Chest Port 1 View  01/19/2012  *RADIOLOGY REPORT*  Clinical Data: [place] earlier today.  History of hypertension, diabetes, more obesity, and CHF.  PORTABLE CHEST - 1 VIEW 01/19/2012 1740 hours:  Comparison: Two-view chest x-ray 08/16/2011 [facility] in 12/20/2009 [facility].  Findings: Patient rotated to the right.  Underexposed image due to portable technique.  Apparent right jugular central venous catheter which can be followed to the upper SVC, though the tip is not visible.  Cardiac silhouette enlarged but stable.  Pulmonary venous hypertension.  Apparent hazy opacity over the left lung and the right mid lung is felt to be artifactual and related to the rotation.  IMPRESSION: Suboptimal examination due to patient rotation and portable technique.  Stable cardiomegaly.  Pulmonary venous hypertension.  Repeat imaging on the fixed radiographic unit would be suggested when the patient is clinically able.  Original Report Authenticated By: Deniece Portela, M.D.     Disposition and Follow-up: Discharge Orders    Future Appointments: Provider: Department: Dept Phone: Center:   02/08/2012 12:45 PM [facility] [phone] None     Future Orders Please Complete By Expires   Diet Carb Modified      Increase activity slowly      (HEART FAILURE PATIENTS) Call MD:  Anytime you have any of the following symptoms: 1) 3 pound weight gain in 24 hours or 5 pounds in 1 week 2) shortness of breath, with or without a dry hacking cough 3) swelling in the hands, feet or stomach 4) if you have to sleep on extra pillows at night in order to breathe.      Discharge instructions      Comments:   Low salt diet       DISPOSITION: Home  DIET:  low-salt diet   ACTIVITY:  as tolerated, encouraged to start exercising   DISCHARGE FOLLOW-UP Follow-up Information    Follow up with Glori Bickers, MD on 02/08/2012. (at 12:45p   [employer] Code 6000))    Contact information:   [facility] [phone]       Follow up with [name], MD. Schedule an appointment as soon as possible for a visit in 10 days. (for hospital follow up)    Contact information:   [place] [phone]       Follow up with Alcide Evener, MD. Schedule an appointment as soon as possible for a visit in 4 weeks.   Contact information:   [address] [place] [phone]          Time spent on Discharge: 45 minutes   Signed:  Kailen Hinkle M.D. [facility] 01/30/2012, 5:41 PM

## 2012-01-30 NOTE — Progress Notes (Signed)
PT Cancellation Note  Treatment cancelled today due to patient's refusal to participate.  Pt getting ready to d/c home.  Sena Hitch 01/30/2012, 2:06 PM [phone]

## 2012-01-30 NOTE — Progress Notes (Signed)
Advanced Heart Failure Rounding Note   Subjective:    [name] is a 51 y.o. African American female with pertinent history of presumed nonischemic cardiomyopathy with last EF 35-40% in 2011 (no report of cath), chronic renal insufficiency, diabetes mellitus, hypertension, obesity, hypothyroidism and hidradenitis suppurativa.   Echo 11/2009: LVEF 35-40% Akinesis of distal anteroseptal and apical walls. RV normal. Mild MR/TR. pRVSP 46   Remains on IV lasix.  Weight down about 40 pounds this admission.   No orthopnea/PND.  Continues to diurese well.  Tolerating Hydralazine/imdur.  No ACE-I 2/2 renal failure.  Cr stable  Objective:    Vital Signs:   Temp:  [97.8 F (36.6 C)-98.6 F (37 C)] 97.8 F (36.6 C) (03/05 0512) Pulse Rate:  [81-94] 87  (03/05 0512) Resp:  [16-20] 20  (03/05 0512) BP: (126-170)/(65-94) 140/78 mmHg (03/05 0503) SpO2:  [96 %-99 %] 99 % (03/05 0512) Weight:  [162.3 kg (357 lb 12.9 oz)] 162.3 kg (357 lb 12.9 oz) (03/05 0503) Last BM Date: 01/27/12   Intake/Output:   Intake/Output Summary (Last 24 hours) at 01/30/12 0818 Last data filed at 01/30/12 0721  Gross per 24 hour  Intake   1006 ml  Output   6425 ml  Net  -5419 ml    Wt 376 lbs  Physical Exam: General: Lying in bed. Massively obese. No resp difficulty  HEENT: normal  Neck: supple. Unable to evaluate JVD. Carotids 2+ bilat; . No lymphadenopathy or thryomegaly appreciated.  Cor: PMI nonpalpabe. Distant heart sounds/ Regular rate & rhythm. No rubs, gallops or murmurs.  Lungs: clear  Abdomen: markedly obese. soft, nontender.Good bowel sounds.  Extremities: no cyanosis, clubbing, rash. trace edema  Neuro: alert & orientedx3, cranial nerves grossly intact. moves all 4 extremities w/o difficulty. Affect pleasant  Telemetry: SR 70-80s with PVCs  Labs: Basic Metabolic Panel:  Lab [id number] 01/29/12 0533 01/28/12 0640 01/27/12 0500 01/26/12 0700  NA 140 139 141 138 140  K 4.2 4.2 4.7 4.7 4.9    CL 103 105 107 106 109  CO2 29 26 25 22 22   GLUCOSE 97 66* 79 87 93  BUN 34* 34* 36* 38* 40*  CREATININE 1.78* 1.74* 1.81* 1.93* 1.99*  CALCIUM 8.7 8.9 9.1 -- --  MG -- -- -- -- --  PHOS -- -- -- -- --    Liver Function Tests: No results found for this basename: AST:5,ALT:5,ALKPHOS:5,BILITOT:5,PROT:5,ALBUMIN:5 in the last 168 hours No results found for this basename: LIPASE:5,AMYLASE:5 in the last 168 hours No results found for this basename: AMMONIA:3 in the last 168 hours  CBC:  Lab 01/29/12 0533 01/28/12 0640 01/27/12 0500 01/24/12 0533  WBC 12.5* 12.2* 12.7* 14.1*  NEUTROABS -- -- -- --  HGB 8.0* 7.8* 8.0* 8.1*  HCT 28.5* 28.0* 28.5* 29.8*  MCV 72.9* 72.7* 73.5* 73.0*  PLT 317 308 339 372    Cardiac Enzymes: No results found for this basename: CKTOTAL:5,CKMB:5,CKMBINDEX:5,TROPONINI:5 in the last 168 hours  BNP: No components found with this basename: POCBNP:5  CBG:  Lab 01/30/12 0637 01/29/12 2220 01/29/12 1645 01/29/12 1242 01/29/12 0614  GLUCAP 105* 118* 150* 70 74    Coagulation Studies:  Basename 01/29/12 0533  LABPROT 17.7*  INR 1.43     Imaging: Ir Removal Tun Access W/ Port W/o Fl Mod Sed  01/29/2012  *RADIOLOGY REPORT*  Clinical history:51 year old with history of hydradenitis suppurative and active flare up. The patient has a right chest Port- A-Cath which is difficult to access.  The  patient would like to have the port removed and a PICC line placed for IV antibiotics.  PROCEDURE(S): REMOVAL OF CHEST PORT-A-CATH; PLACEMENT OF PERIPHERALLY INSERTED CENTRAL VENOUS CATHETER (PICC) WITH ULTRASOUND AND FLUOROSCOPIC GUIDANCE  Physician: Stephan Minister. Henn, MD  Medications:Versed 1 mg IV  Fluoroscopy time: 0.4 minutes  Procedure:The procedure was explained to the patient.  The risks and benefits of the procedure were discussed and the patient's questions were addressed.  Informed consent was obtained from the patient.  The right upper chest was prepped and draped in  a sterile fashion.  Maximal barrier sterile technique was utilized including caps, mask, sterile gowns, sterile gloves, sterile drape, hand hygiene and skin antiseptic.  The skin was anesthetized with 1% lidocaine.  An incision was made over the Port-A-Cath.  The port and catheter were exposed using blunt dissection.  The retention sutures removed.  The entire port was removed without complication. The pocket was irrigated with normal saline.  The port pocket was closed using two layers of absorbable suture and Dermabond.  A sterile dressing was placed.  Attention was directed to the right arm for PICC line placement.  A patent brachial vein was identified in the right arm with ultrasound.  The right upper arm was prepped and draped in a sterile fashion.  Maximal barrier sterile technique was utilized including caps, mask, sterile gowns, sterile gloves, sterile drape, hand hygiene and skin antiseptic.  The skin was anesthetized with lidocaine.  Using ultrasound guidance, 21 gauge needle was directed into the brachial vein.  Wire was advanced centrally.  Peel-away sheath was placed.   A dual lumen Power PICC line was cut to 40 cm. Catheter tip was placed in the superior vena cava.  Catheter sutured to the skin.  Catheter was flushed with heparinized saline.Fluoroscopic and ultrasound images were taken and saved for documentation.  Findings:PICC line tip in the superior vena cava.  Complications: None  Impression:Successful removal of the right chest Port-A-Cath.  Successful placement of a right arm PICC line using fluoroscopic and ultrasound guidance.  Original Report Authenticated By: Markus Daft, M.D.   Ir US Guide Vasc Access Right  01/29/2012  *RADIOLOGY REPORT*  Clinical history:51 year old with history of hydradenitis suppurative and active flare up. The patient has a right chest Port- A-Cath which is difficult to access.  The patient would like to have the port removed and a PICC line placed for IV  antibiotics.  PROCEDURE(S): REMOVAL OF CHEST PORT-A-CATH; PLACEMENT OF PERIPHERALLY INSERTED CENTRAL VENOUS CATHETER (PICC) WITH ULTRASOUND AND FLUOROSCOPIC GUIDANCE  Physician: Stephan Minister. Henn, MD  Medications:Versed 1 mg IV  Fluoroscopy time: 0.4 minutes  Procedure:The procedure was explained to the patient.  The risks and benefits of the procedure were discussed and the patient's questions were addressed.  Informed consent was obtained from the patient.  The right upper chest was prepped and draped in a sterile fashion.  Maximal barrier sterile technique was utilized including caps, mask, sterile gowns, sterile gloves, sterile drape, hand hygiene and skin antiseptic.  The skin was anesthetized with 1% lidocaine.  An incision was made over the Port-A-Cath.  The port and catheter were exposed using blunt dissection.  The retention sutures removed.  The entire port was removed without complication. The pocket was irrigated with normal saline.  The port pocket was closed using two layers of absorbable suture and Dermabond.  A sterile dressing was placed.  Attention was directed to the right arm for PICC line placement.  A patent brachial vein  was identified in the right arm with ultrasound.  The right upper arm was prepped and draped in a sterile fashion.  Maximal barrier sterile technique was utilized including caps, mask, sterile gowns, sterile gloves, sterile drape, hand hygiene and skin antiseptic.  The skin was anesthetized with lidocaine.  Using ultrasound guidance, 21 gauge needle was directed into the brachial vein.  Wire was advanced centrally.  Peel-away sheath was placed.   A dual lumen Power PICC line was cut to 40 cm. Catheter tip was placed in the superior vena cava.  Catheter sutured to the skin.  Catheter was flushed with heparinized saline.Fluoroscopic and ultrasound images were taken and saved for documentation.  Findings:PICC line tip in the superior vena cava.  Complications: None   Impression:Successful removal of the right chest Port-A-Cath.  Successful placement of a right arm PICC line using fluoroscopic and ultrasound guidance.  Original Report Authenticated By: Markus Daft, M.D.   Ir Central Line Right  01/29/2012  *RADIOLOGY REPORT*  Clinical history:51 year old with history of hydradenitis suppurative and active flare up. The patient has a right chest Port- A-Cath which is difficult to access.  The patient would like to have the port removed and a PICC line placed for IV antibiotics.  PROCEDURE(S): REMOVAL OF CHEST PORT-A-CATH; PLACEMENT OF PERIPHERALLY INSERTED CENTRAL VENOUS CATHETER (PICC) WITH ULTRASOUND AND FLUOROSCOPIC GUIDANCE  Physician: Stephan Minister. Henn, MD  Medications:Versed 1 mg IV  Fluoroscopy time: 0.4 minutes  Procedure:The procedure was explained to the patient.  The risks and benefits of the procedure were discussed and the patient's questions were addressed.  Informed consent was obtained from the patient.  The right upper chest was prepped and draped in a sterile fashion.  Maximal barrier sterile technique was utilized including caps, mask, sterile gowns, sterile gloves, sterile drape, hand hygiene and skin antiseptic.  The skin was anesthetized with 1% lidocaine.  An incision was made over the Port-A-Cath.  The port and catheter were exposed using blunt dissection.  The retention sutures removed.  The entire port was removed without complication. The pocket was irrigated with normal saline.  The port pocket was closed using two layers of absorbable suture and Dermabond.  A sterile dressing was placed.  Attention was directed to the right arm for PICC line placement.  A patent brachial vein was identified in the right arm with ultrasound.  The right upper arm was prepped and draped in a sterile fashion.  Maximal barrier sterile technique was utilized including caps, mask, sterile gowns, sterile gloves, sterile drape, hand hygiene and skin antiseptic.  The skin was  anesthetized with lidocaine.  Using ultrasound guidance, 21 gauge needle was directed into the brachial vein.  Wire was advanced centrally.  Peel-away sheath was placed.   A dual lumen Power PICC line was cut to 40 cm. Catheter tip was placed in the superior vena cava.  Catheter sutured to the skin.  Catheter was flushed with heparinized saline.Fluoroscopic and ultrasound images were taken and saved for documentation.  Findings:PICC line tip in the superior vena cava.  Complications: None  Impression:Successful removal of the right chest Port-A-Cath.  Successful placement of a right arm PICC line using fluoroscopic and ultrasound guidance.  Original Report Authenticated By: Markus Daft, M.D.     Medications:     Scheduled Medications:    . aspirin EC  81 mg Oral Daily  . carvedilol  3.125 mg Oral BID WC  . DULoxetine  60 mg Oral Daily  . ferrous sulfate  325 mg Oral BID WC  . folic acid  1 mg Oral Daily  . furosemide  40 mg Intravenous Q8H  . hydrALAZINE  37.5 mg Oral Q8H  . insulin aspart  0-5 Units Subcutaneous QHS  . insulin aspart  0-9 Units Subcutaneous TID WC  . insulin aspart protamine-insulin aspart  15 Units Subcutaneous Q supper  . insulin aspart protamine-insulin aspart  30 Units Subcutaneous Q breakfast  . isosorbide mononitrate  30 mg Oral Daily  . levofloxacin  500 mg Oral Daily  . levothyroxine  25 mcg Oral Q breakfast  . simvastatin  20 mg Oral q1800  . sodium polystyrene  30 g Oral Once  . vancomycin  1,750 mg Intravenous Q48H  . ascorbic acid  500 mg Oral BID  . zinc sulfate  220 mg Oral Daily  . DISCONTD: furosemide  40 mg Intravenous Q8H  . DISCONTD: furosemide  60 mg Oral Daily  . DISCONTD: furosemide  60 mg Oral BID  . DISCONTD: hydrALAZINE  25 mg Oral Q8H  . DISCONTD: vancomycin  1,250 mg Intravenous Q24H  . DISCONTD: vancomycin  1,750 mg Intravenous Q48H    Infusions:    PRN Medications: sodium chloride, acetaminophen, midazolam, ondansetron (ZOFRAN) IV,  oxyCODONE-acetaminophen, sodium chloride   Assessment:   1. Acute on chronic systolic HF  2. NICM, EF 35-40% - refusing repeat echo  3. E. Coli UTI  - on rocephin  4. Acute on chronic renal failure  5. DM2  6. HTN  7. Morbid obesity  8. Medical noncompliance  Plan/Discussion:    Patient's volume status is improving and renal function remains stable.  She is extremely anxious to go today.  She was taking lasix 40 mg BID at home but says she often only took 40 daily. Thus agree with 60 bid per Dr. Josem Kaufmann note. Continue hydralazine 37.5 mg q8 hours, Imdur 30 mg daily, Coreg 3.125 mg BID.  She is to follow up in the heart failure clinic.  Discussed the importance of daily weights and low sodium diet.    Length of Stay: [address] [place]  01/30/2012, 8:18 AM  Patient seen and examined with Leone Haven PA-C. We discussed all aspects of the encounter. I agree with the assessment and plan as stated above.   Volume status much improved on exam. Now down 40+ pounds. She wants to go home and I think that is ok.  Agree with lasix 60 bid and hydralazine nitrates as above. No ACE-I due to renal failure. Will see her back in HF clinic next week with BMET. Discussed need for better compliance.   Shelley Janes,MD 9:22 AM

## 2012-01-30 NOTE — Plan of Care (Signed)
Problem: Phase I Progression Outcomes Goal: EF % per last Echo/documented,Core Reminder form on chart Outcome: Adequate for Discharge EF 35-40% per ECHO performed in January 2011. Pt refused ECHO this admission.

## 2012-01-30 NOTE — Progress Notes (Signed)
Nutrition Education Note:  RD received another consult for CHF education today. RD spoke with patient and family about diet at home. Patient expressed an interest in making changes to home diet at this time. Patient had already had education with the Heart Failure Booklet, RD provided low sodium nutrition therapy handout from the [employer]. RD went through the information with the patient and answered questions. Patient appears to have a good support system of family members. No additional nutrition interventions at this time.  BMI = 56.2 kg/(m^2)  Morbid obesity  Orson Slick MARIE [id number]

## 2012-02-01 ENCOUNTER — Encounter (HOSPITAL_COMMUNITY): Payer: BC Managed Care – PPO

## 2012-02-01 NOTE — Progress Notes (Signed)
Patient ID: [name], female   DOB: May 13, 1961, 51 y.o.   MRN: [id number] Dr. Tommy Medal agreed to see patient on 4/17 @ 9am.  I called and left patient a voicemail on 3/6 stating that it was okay to wait to see him on 4/17.  Patient called and left me a message wanting to know if she should continue antibiotic or stop and wait to see Dr. Tommy Medal. Per Dr. Tommy Medal, she can stop the antibiotic and wait to see him on 4/17 @ 9am. I called the patient and left this information on her voicemail/JLH

## 2012-02-07 ENCOUNTER — Telehealth: Payer: Self-pay | Admitting: Infectious Disease

## 2012-02-07 NOTE — Telephone Encounter (Signed)
Mrs Starke's serum creatinine is now 2.67 up from 1.7 as an inpatient. I would like her to stop her lasix and to have a repeat draw on her serum creatinine later this week either via [facility] or our clinic. I left message with pt about this and asked her to call clinic back

## 2012-02-08 ENCOUNTER — Telehealth: Payer: Self-pay | Admitting: Infectious Disease

## 2012-02-08 ENCOUNTER — Encounter (HOSPITAL_COMMUNITY): Payer: BC Managed Care – PPO

## 2012-02-08 NOTE — Telephone Encounter (Signed)
I left another message for [name]. I would like her to hold her lasix and have her creatinine checked again. Tamika can you see if you can get ahold of her. I am not getting her on her cell.

## 2012-02-09 ENCOUNTER — Telehealth: Payer: Self-pay | Admitting: Licensed Clinical Social Worker

## 2012-02-09 NOTE — Telephone Encounter (Signed)
The patient does not want to stop taking her lasix, I spoke to Dr. Tommy Medal about it and he said she should at least cut the dosage down to every other day. I left a message on the patients voicemail with this information.

## 2012-02-15 ENCOUNTER — Telehealth: Payer: Self-pay

## 2012-02-15 NOTE — Telephone Encounter (Signed)
On 02/08/12 [name] arrived to our clinic for an appointment.  Unfortunately, her appointment had been rescheduled and she had not received the updated information.  She was given her new appointment information, however, she was understandably upset over the mix up.  Our clinic scheduler brought this to my attention after the patient had left and I have since made multiple attempts to reach [name] to perform service recovery and to confirm her new appointment.  I've left voice mail messages on her home, cell and work numbers and have received no call backs.

## 2012-02-19 ENCOUNTER — Encounter (HOSPITAL_COMMUNITY): Payer: Self-pay | Admitting: *Deleted

## 2012-02-19 ENCOUNTER — Ambulatory Visit (HOSPITAL_COMMUNITY): Payer: BC Managed Care – PPO

## 2012-02-20 ENCOUNTER — Telehealth: Payer: Self-pay | Admitting: *Deleted

## 2012-02-20 NOTE — Telephone Encounter (Signed)
[facility] RN reports that the pt is receiving Vancomycin every other day.  [place] office report having received this on 02/01/12.  RN spoke with Dr. Tommy Medal.  Dr. Tommy Medal is concerned about the pt's creatinine levels.  Order received to d/c the vancomycin today, give the pt enough PICC line supplies to get her to her new follow-up appt scheduled w/ Dr. Michel Bickers for 02/29/12 @ 3:45 PM.  Call to [facility] to provide orders.  New Vancomycin orders given to [facility] RN along with information about "new" [facility] appt with Dr. Megan Salon.  Verbalized back these orders.  Message left for pt.  Dr. Tommy Medal has stopped the vancomycin but the pt needs to continue flushing PICC until she comes to [facility] on 02/29/12 @ 3:45 PM to see Dr. Megan Salon.  [facility] telephone # left for pt to call if she has questions or needs to reschedule the [facility] appt.

## 2012-02-28 ENCOUNTER — Ambulatory Visit (HOSPITAL_COMMUNITY): Payer: BC Managed Care – PPO | Attending: Internal Medicine

## 2012-02-29 ENCOUNTER — Telehealth: Payer: Self-pay | Admitting: *Deleted

## 2012-02-29 ENCOUNTER — Inpatient Hospital Stay: Payer: BC Managed Care – PPO | Admitting: Internal Medicine

## 2012-02-29 NOTE — Telephone Encounter (Signed)
States she has been finished with the meds "for 1 week or two" wants the PICC out. Since she now has to wait for another appt more than a week from now, wants to know if a doctor here will give orders for them to remove the PICC. Forwarded to clinic md. Her number is [phone]

## 2012-03-01 NOTE — Telephone Encounter (Signed)
Ok to pull picc then, though it would have been better to see her.  She come back to see Dr. Tommy Medal when appt is available.

## 2012-03-01 NOTE — Telephone Encounter (Signed)
Called pt. She has an appt on the 11th with Dr. Megan Salon. States this is when she can come.  Called [facility] & spoke with the infusion nurse. gave the order to pull it

## 2012-03-07 ENCOUNTER — Ambulatory Visit (INDEPENDENT_AMBULATORY_CARE_PROVIDER_SITE_OTHER): Payer: BC Managed Care – PPO | Admitting: Internal Medicine

## 2012-03-07 ENCOUNTER — Encounter: Payer: Self-pay | Admitting: Internal Medicine

## 2012-03-07 VITALS — BP 158/82 | HR 75 | Temp 98.8°F | Ht 66.5 in | Wt 338.0 lb

## 2012-03-07 LAB — BASIC METABOLIC PANEL
BUN: 37 mg/dL — ABNORMAL HIGH (ref 6–23)
CO2: 18 mEq/L — ABNORMAL LOW (ref 19–32)
Glucose, Bld: 97 mg/dL (ref 70–99)
Potassium: 5.4 mEq/L — ABNORMAL HIGH (ref 3.5–5.3)
Sodium: 136 mEq/L (ref 135–145)

## 2012-03-07 NOTE — Progress Notes (Signed)
Patient ID: [name], female   DOB: 1961-05-15, 51 y.o.   MRN: [id number]  INFECTIOUS DISEASE PROGRESS NOTE    Subjective: [name] is seen on a work in basis. She suffers with chronic hidradenitis and was rehospitalized in February. She was started back on IV vancomycin and oral levofloxacin. Her chronic draining groin lesions did improve. She was on antibiotic therapy from February 22 until Dr. Tommy Medal stopped her IV vancomycin on March 26 when her creatinine had risen from 1.7 to 2.67. She has continued to take levofloxacin but does not feel that it is helping. Since stopping vancomycin she has noted increased drainage. She states she is very frustrated.  She saw a Psychologist, sport and exercise at [facility] earlier this year. She knows that surgery is the only intervention that is likely to lead to significant benefit but she has not wanted to pursue that because of the lengthy recovery and likelihood that she would not be able to work and pay her medical bills. However, she is now thinking that that will be the best option for her if she can get disability.  Objective: Temp: 98.8 F (37.1 C) (04/11 1555) Temp src: Oral (04/11 1555) BP: 158/82 mmHg (04/11 1555) Pulse Rate: 75  (04/11 1555)  General: She is despondent sitting in a wheelchair Skin: Her former PICC site appears normal She prefers not to be examined today while she is in a wheelchair   Assessment: She has acute on chronic renal insufficiency probably induced by the vancomycin. I will repeat her lab work today. I do not think continuing the levofloxacin will offer much more benefit so I will stop it and observe off of antibiotics. I've encouraged her to recontact her surgeon to have another conversation about surgical options for hidradenitis.   Plan: 1.  discontinue levofloxacin and observe off of antibiotics 2. check basic metabolic panel   Michel Bickers, MD [facility] for [place] [phone] pager   [phone] cell 03/07/2012, 5:20 PM

## 2012-03-11 ENCOUNTER — Encounter: Payer: Self-pay | Admitting: Infectious Disease

## 2012-03-13 ENCOUNTER — Inpatient Hospital Stay: Payer: BC Managed Care – PPO | Admitting: Infectious Disease

## 2012-04-04 ENCOUNTER — Encounter (HOSPITAL_COMMUNITY): Payer: Self-pay | Admitting: *Deleted

## 2012-04-04 ENCOUNTER — Inpatient Hospital Stay (HOSPITAL_COMMUNITY)
Admission: EM | Admit: 2012-04-04 | Discharge: 2012-04-07 | DRG: 544 | Disposition: A | Discharge status: Home / Self Care | Payer: BC Managed Care – PPO | Attending: Internal Medicine | Admitting: Internal Medicine

## 2012-04-04 ENCOUNTER — Emergency Department (HOSPITAL_COMMUNITY): Payer: BC Managed Care – PPO

## 2012-04-04 DIAGNOSIS — L88 Pyoderma gangrenosum: Secondary | ICD-10-CM

## 2012-04-04 DIAGNOSIS — Z794 Long term (current) use of insulin: Secondary | ICD-10-CM

## 2012-04-04 DIAGNOSIS — K219 Gastro-esophageal reflux disease without esophagitis: Secondary | ICD-10-CM

## 2012-04-04 DIAGNOSIS — N183 Chronic kidney disease, stage 3 unspecified: Secondary | ICD-10-CM | POA: Diagnosis present

## 2012-04-04 DIAGNOSIS — Z872 Personal history of diseases of the skin and subcutaneous tissue: Secondary | ICD-10-CM

## 2012-04-04 DIAGNOSIS — E042 Nontoxic multinodular goiter: Secondary | ICD-10-CM

## 2012-04-04 DIAGNOSIS — E039 Hypothyroidism, unspecified: Secondary | ICD-10-CM | POA: Diagnosis present

## 2012-04-04 DIAGNOSIS — Z6841 Body Mass Index (BMI) 40.0 and over, adult: Secondary | ICD-10-CM

## 2012-04-04 DIAGNOSIS — I2789 Other specified pulmonary heart diseases: Secondary | ICD-10-CM | POA: Diagnosis present

## 2012-04-04 DIAGNOSIS — E875 Hyperkalemia: Secondary | ICD-10-CM | POA: Diagnosis present

## 2012-04-04 DIAGNOSIS — B965 Pseudomonas (aeruginosa) (mallei) (pseudomallei) as the cause of diseases classified elsewhere: Secondary | ICD-10-CM

## 2012-04-04 DIAGNOSIS — I509 Heart failure, unspecified: Secondary | ICD-10-CM | POA: Diagnosis present

## 2012-04-04 DIAGNOSIS — I5021 Acute systolic (congestive) heart failure: Secondary | ICD-10-CM

## 2012-04-04 DIAGNOSIS — I129 Hypertensive chronic kidney disease with stage 1 through stage 4 chronic kidney disease, or unspecified chronic kidney disease: Secondary | ICD-10-CM | POA: Diagnosis present

## 2012-04-04 DIAGNOSIS — D649 Anemia, unspecified: Secondary | ICD-10-CM

## 2012-04-04 DIAGNOSIS — F0630 Mood disorder due to known physiological condition, unspecified: Secondary | ICD-10-CM

## 2012-04-04 DIAGNOSIS — D638 Anemia in other chronic diseases classified elsewhere: Secondary | ICD-10-CM | POA: Diagnosis present

## 2012-04-04 DIAGNOSIS — D509 Iron deficiency anemia, unspecified: Secondary | ICD-10-CM | POA: Diagnosis present

## 2012-04-04 DIAGNOSIS — N179 Acute kidney failure, unspecified: Secondary | ICD-10-CM | POA: Diagnosis present

## 2012-04-04 DIAGNOSIS — R0602 Shortness of breath: Secondary | ICD-10-CM

## 2012-04-04 DIAGNOSIS — L732 Hidradenitis suppurativa: Secondary | ICD-10-CM

## 2012-04-04 DIAGNOSIS — E119 Type 2 diabetes mellitus without complications: Secondary | ICD-10-CM | POA: Diagnosis present

## 2012-04-04 DIAGNOSIS — I5023 Acute on chronic systolic (congestive) heart failure: Principal | ICD-10-CM | POA: Diagnosis present

## 2012-04-04 DIAGNOSIS — N289 Disorder of kidney and ureter, unspecified: Secondary | ICD-10-CM

## 2012-04-04 DIAGNOSIS — I5020 Unspecified systolic (congestive) heart failure: Secondary | ICD-10-CM

## 2012-04-04 DIAGNOSIS — I10 Essential (primary) hypertension: Secondary | ICD-10-CM

## 2012-04-04 LAB — CBC
HCT: 23.6 % — ABNORMAL LOW (ref 36.0–46.0)
HCT: 25.1 % — ABNORMAL LOW (ref 36.0–46.0)
Hemoglobin: 7.3 g/dL — ABNORMAL LOW (ref 12.0–15.0)
MCHC: 28.8 g/dL — ABNORMAL LOW (ref 30.0–36.0)
MCV: 75.9 fL — ABNORMAL LOW (ref 78.0–100.0)
Platelets: 405 10*3/uL — ABNORMAL HIGH (ref 150–400)
RDW: 21.8 % — ABNORMAL HIGH (ref 11.5–15.5)
WBC: 16.3 10*3/uL — ABNORMAL HIGH (ref 4.0–10.5)
WBC: 15.7 10*3/uL — ABNORMAL HIGH (ref 4.0–10.5)

## 2012-04-04 LAB — CARDIAC PANEL(CRET KIN+CKTOT+MB+TROPI)
Relative Index: INVALID (ref 0.0–2.5)
Total CK: 56 U/L (ref 7–177)
Troponin I: <0.3 ng/mL (ref <0.30)

## 2012-04-04 LAB — RETICULOCYTES
RBC.: 3.26 MIL/uL — ABNORMAL LOW (ref 3.87–5.11)
Retic Count, Absolute: 101.1 10*3/uL (ref 19.0–186.0)

## 2012-04-04 LAB — COMPREHENSIVE METABOLIC PANEL
ALT: <5 U/L (ref 0–35)
Alkaline Phosphatase: 140 U/L — ABNORMAL HIGH (ref 39–117)
CO2: 17 mEq/L — ABNORMAL LOW (ref 19–32)
Calcium: 8.6 mg/dL (ref 8.4–10.5)
GFR calc Af Amer: 26 mL/min — ABNORMAL LOW (ref >90)
GFR calc non Af Amer: 23 mL/min — ABNORMAL LOW (ref >90)
Glucose, Bld: 116 mg/dL — ABNORMAL HIGH (ref 70–99)
Potassium: 5.6 mEq/L — ABNORMAL HIGH (ref 3.5–5.1)
Sodium: 136 mEq/L (ref 135–145)

## 2012-04-04 LAB — DIFFERENTIAL
Basophils Absolute: 0 10*3/uL (ref 0.0–0.1)
Basophils Relative: 0 % (ref 0–1)
Eosinophils Absolute: 0.8 10*3/uL — ABNORMAL HIGH (ref 0.0–0.7)
Eosinophils Relative: 5 % (ref 0–5)
Lymphocytes Relative: 8 % — ABNORMAL LOW (ref 12–46)
Monocytes Absolute: 0.8 10*3/uL (ref 0.1–1.0)

## 2012-04-04 LAB — URINALYSIS, ROUTINE W REFLEX MICROSCOPIC
Bilirubin Urine: NEGATIVE
Ketones, ur: NEGATIVE mg/dL
Nitrite: NEGATIVE
pH: 5.5 (ref 5.0–8.0)

## 2012-04-04 LAB — URINE MICROSCOPIC-ADD ON

## 2012-04-04 LAB — POCT I-STAT TROPONIN I: Troponin i, poc: 0 ng/mL (ref 0.00–0.08)

## 2012-04-04 LAB — PREPARE RBC (CROSSMATCH)

## 2012-04-04 LAB — GLUCOSE, CAPILLARY: Glucose-Capillary: 145 mg/dL — ABNORMAL HIGH (ref 70–99)

## 2012-04-04 MED ORDER — ONDANSETRON HCL 4 MG PO TABS: 4.0000 mg | ORAL_TABLET | Freq: Four times a day (QID) | ORAL | Status: DC | PRN

## 2012-04-04 MED ORDER — ASPIRIN EC 81 MG PO TBEC
81.0000 mg | DELAYED_RELEASE_TABLET | Freq: Every day | ORAL | Status: DC
  Administered 2012-04-05 – 2012-04-07 (×3): 81 mg via ORAL
  Filled 2012-04-04 (×4): qty 1

## 2012-04-04 MED ORDER — FUROSEMIDE 10 MG/ML IJ SOLN
60.0000 mg | Freq: Once | INTRAMUSCULAR | Status: AC
  Administered 2012-04-04: 60 mg via INTRAVENOUS
  Filled 2012-04-04: qty 6

## 2012-04-04 MED ORDER — DULOXETINE HCL 60 MG PO CPEP
60.0000 mg | ORAL_CAPSULE | Freq: Every day | ORAL | Status: DC
  Administered 2012-04-05 – 2012-04-07 (×3): 60 mg via ORAL
  Filled 2012-04-04 (×4): qty 1

## 2012-04-04 MED ORDER — DEXTROSE 50 % IV SOLN
1.0000 | Freq: Once | INTRAVENOUS | Status: AC
  Administered 2012-04-04: 50 mL via INTRAVENOUS
  Filled 2012-04-04: qty 50

## 2012-04-04 MED ORDER — ALBUTEROL SULFATE (5 MG/ML) 0.5% IN NEBU: 2.5000 mg | INHALATION_SOLUTION | RESPIRATORY_TRACT | Status: DC | PRN

## 2012-04-04 MED ORDER — IPRATROPIUM BROMIDE 0.02 % IN SOLN
0.5000 mg | Freq: Four times a day (QID) | RESPIRATORY_TRACT | Status: DC
  Administered 2012-04-04 – 2012-04-05 (×2): 0.5 mg via RESPIRATORY_TRACT
  Filled 2012-04-04 (×2): qty 2.5

## 2012-04-04 MED ORDER — LEVOTHYROXINE SODIUM 25 MCG PO TABS
25.0000 ug | ORAL_TABLET | Freq: Every day | ORAL | Status: DC
  Administered 2012-04-05 – 2012-04-07 (×3): 25 ug via ORAL
  Filled 2012-04-04 (×4): qty 1

## 2012-04-04 MED ORDER — FERROUS SULFATE 325 (65 FE) MG PO TABS
325.0000 mg | ORAL_TABLET | Freq: Two times a day (BID) | ORAL | Status: DC
  Administered 2012-04-05 – 2012-04-07 (×4): 325 mg via ORAL
  Filled 2012-04-04 (×8): qty 1

## 2012-04-04 MED ORDER — DOCUSATE SODIUM 100 MG PO CAPS
100.0000 mg | ORAL_CAPSULE | Freq: Two times a day (BID) | ORAL | Status: DC
  Administered 2012-04-06: 100 mg via ORAL
  Filled 2012-04-04 (×7): qty 1

## 2012-04-04 MED ORDER — SODIUM CHLORIDE 0.9 % IJ SOLN
3.0000 mL | Freq: Two times a day (BID) | INTRAMUSCULAR | Status: DC
  Administered 2012-04-06 (×2): 3 mL via INTRAVENOUS

## 2012-04-04 MED ORDER — SIMVASTATIN 5 MG PO TABS
5.0000 mg | ORAL_TABLET | Freq: Every day | ORAL | Status: DC
  Administered 2012-04-05 – 2012-04-06 (×2): 5 mg via ORAL
  Filled 2012-04-04 (×3): qty 1

## 2012-04-04 MED ORDER — SODIUM CHLORIDE 0.9 % IV SOLN
1.0000 g | Freq: Once | INTRAVENOUS | Status: AC
  Administered 2012-04-04: 1 g via INTRAVENOUS
  Filled 2012-04-04: qty 10

## 2012-04-04 MED ORDER — ACETAMINOPHEN 650 MG RE SUPP: 650.0000 mg | Freq: Four times a day (QID) | RECTAL | Status: DC | PRN

## 2012-04-04 MED ORDER — SODIUM CHLORIDE 0.9 % IJ SOLN: 3.0000 mL | INTRAMUSCULAR | Status: DC | PRN

## 2012-04-04 MED ORDER — SENNA 8.6 MG PO TABS
1.0000 | ORAL_TABLET | Freq: Two times a day (BID) | ORAL | Status: DC
  Administered 2012-04-06: 8.6 mg via ORAL
  Filled 2012-04-04: qty 1

## 2012-04-04 MED ORDER — ENOXAPARIN SODIUM 30 MG/0.3ML ~~LOC~~ SOLN: 30.0000 mg | SUBCUTANEOUS | Status: DC

## 2012-04-04 MED ORDER — OXYCODONE HCL 5 MG PO TABS
5.0000 mg | ORAL_TABLET | ORAL | Status: DC | PRN
  Administered 2012-04-04 – 2012-04-07 (×8): 5 mg via ORAL
  Filled 2012-04-04 (×8): qty 1

## 2012-04-04 MED ORDER — INSULIN REGULAR HUMAN 100 UNIT/ML IJ SOLN: 5.0000 [IU] | Freq: Once | INTRAMUSCULAR | Status: DC

## 2012-04-04 MED ORDER — ONDANSETRON HCL 4 MG/2ML IJ SOLN: 4.0000 mg | Freq: Three times a day (TID) | INTRAMUSCULAR | Status: DC | PRN

## 2012-04-04 MED ORDER — ACETAMINOPHEN 325 MG PO TABS: 650.0000 mg | ORAL_TABLET | Freq: Four times a day (QID) | ORAL | Status: DC | PRN

## 2012-04-04 MED ORDER — ENOXAPARIN SODIUM 40 MG/0.4ML ~~LOC~~ SOLN
40.0000 mg | SUBCUTANEOUS | Status: DC
  Administered 2012-04-04 – 2012-04-05 (×2): 40 mg via SUBCUTANEOUS
  Filled 2012-04-04 (×3): qty 0.4

## 2012-04-04 MED ORDER — CARVEDILOL 6.25 MG PO TABS
6.2500 mg | ORAL_TABLET | Freq: Two times a day (BID) | ORAL | Status: DC
  Administered 2012-04-04 – 2012-04-07 (×6): 6.25 mg via ORAL
  Filled 2012-04-04 (×8): qty 1

## 2012-04-04 MED ORDER — SODIUM CHLORIDE 0.9 % IJ SOLN
3.0000 mL | Freq: Two times a day (BID) | INTRAMUSCULAR | Status: DC
  Administered 2012-04-04 – 2012-04-07 (×3): 3 mL via INTRAVENOUS

## 2012-04-04 MED ORDER — INSULIN ASPART 100 UNIT/ML IV SOLN
5.0000 [IU] | Freq: Once | INTRAVENOUS | Status: DC
  Administered 2012-04-04: 5 [IU] via INTRAVENOUS
  Filled 2012-04-04: qty 0.05

## 2012-04-04 MED ORDER — SODIUM POLYSTYRENE SULFONATE 15 GM/60ML PO SUSP
30.0000 g | Freq: Once | ORAL | Status: AC
  Administered 2012-04-04: 30 g via ORAL
  Filled 2012-04-04 (×2): qty 60

## 2012-04-04 MED ORDER — SODIUM CHLORIDE 0.9 % IV SOLN: 250.0000 mL | INTRAVENOUS | Status: DC | PRN

## 2012-04-04 MED ORDER — ONDANSETRON HCL 4 MG/2ML IJ SOLN: 4.0000 mg | Freq: Four times a day (QID) | INTRAMUSCULAR | Status: DC | PRN

## 2012-04-04 NOTE — ED Provider Notes (Addendum)
History     CSN: [id number]  Arrival date & time 04/04/12  1154   First MD Initiated Contact with Patient 04/04/12 1215      Chief Complaint  Patient presents with  . Anemia  . Pain    (Consider location/radiation/quality/duration/timing/severity/associated sxs/prior treatment) Patient is a 51 y.o. female presenting with anemia. The history is provided by the patient.  Anemia This is a recurrent problem. Associated symptoms include numbness and weakness. Pertinent negatives include no chills, coughing, fever or neck pain.  Pt states she had blood work done on Tuesday, and was called yesterday and was told her Hgb was 6.7 and was told to come to ER for blood transfusion. Pt has hx of the same, with blood transfusion in the past, was thought due to her renal insuficiency. Pt does admit to feeling weak and dizzy.  Pt also states she has been feeling short of breath, unable to walk few steps without feeling SOB, and has had increased swelling in legs abdomen, arms, face. Pt in on home lasix, states she is taking it as prescribed. Pt denies fever, chills, abdominal pain, urinary symptoms. Pt states she has been having chest pain with her shortness of breath   Past Medical History  Diagnosis Date  . Systolic congestive heart failure   . SOB (shortness of breath)   . HTN (hypertension)   . DM type 2 (diabetes mellitus, type 2)   . Chronic kidney disease (CKD), stage III (moderate)   . Iron deficiency anemia   . Morbid obesity   . Hyperkalemia   . Hypothyroidism   . CHF (congestive heart failure)     Past Surgical History  Procedure Date  . Portacath placement     Family History  Problem Relation Age of Onset  . Coronary artery disease Mother   . Hypertension Mother   . Diabetes type II Mother   . Malignant hyperthermia Mother   . Coronary artery disease Father   . Hypertension Father   . Malignant hyperthermia Father   . Cancer Maternal Grandfather     ? Type    History    Substance Use Topics  . Smoking status: Never Smoker   . Smokeless tobacco: Never Used  . Alcohol Use: No    OB History    Grav Para Term Preterm Abortions TAB SAB Ect Mult Living                  Review of Systems  Constitutional: Negative for fever and chills.  HENT: Negative for neck pain and neck stiffness.   Respiratory: Positive for shortness of breath. Negative for cough and chest tightness.   Cardiovascular: Negative.   Gastrointestinal: Negative.   Genitourinary: Negative for dysuria and flank pain.  Musculoskeletal: Negative.   Skin: Negative.   Neurological: Positive for weakness and numbness.  Hematological: Negative.     Allergies  Amoxicillin-pot clavulanate; Rosiglitazone maleate; and Amoxicillin  Home Medications   Current Outpatient Rx  Name Route Sig Dispense Refill  . ASCORBIC ACID 500 MG PO TABS Oral Take 500 mg by mouth 2 (two) times daily.      . ASPIRIN 81 MG PO TBEC Oral Take 1 tablet (81 mg total) by mouth daily. 30 tablet 3  . CARVEDILOL 3.125 MG PO TABS Oral Take 6.25 mg by mouth 2 (two) times daily with a meal. Take one tab by mouth with food twice a day    . DULOXETINE HCL 60 MG PO CPEP Oral  Take 60 mg by mouth daily.      Marland Kitchen FERROUS SULFATE 325 (65 FE) MG PO TABS Oral Take 325 mg by mouth 2 (two) times daily.      Marland Kitchen FOLIC ACID 1 MG PO TABS Oral Take 1 mg by mouth daily.    . FUROSEMIDE 20 MG PO TABS Oral Take 60 mg by mouth 2 (two) times daily.     . INSULIN LISPRO PROT & LISPRO (75-25) 100 UNIT/ML [place] SUSP  Give 35 units subq every morning and 20 units sub q every PM     . LEVOTHYROXINE SODIUM 25 MCG PO TABS Oral Take 25 mcg by mouth daily.    Marland Kitchen MAGNESIUM OXIDE 400 MG PO TABS Oral Take 400 mg by mouth 2 (two) times daily.     . OXYCODONE-ACETAMINOPHEN 5-325 MG PO TABS Oral Take 1 tablet by mouth every 6 (six) hours as needed. For pain    . PRAVASTATIN SODIUM 40 MG PO TABS Oral Take 40 mg by mouth daily.      Marland Kitchen ZINC SULFATE 220 MG PO CAPS Oral  Take 220 mg by mouth daily.      BP 139/70  Pulse 78  Temp(Src) 98.3 F (36.8 C) (Oral)  Resp 24  Ht 5\' 6"  (1.676 m)  Wt 380 lb (172.367 kg)  BMI 61.33 kg/m2  SpO2 96%  Physical Exam  Nursing note and vitals reviewed. Constitutional: She is oriented to person, place, and time. She appears well-developed and well-nourished. No distress.  HENT:  Head: Normocephalic and atraumatic.  Eyes: Conjunctivae normal are normal. Pupils are equal, round, and reactive to light.  Neck: Neck supple.  Cardiovascular: Normal rate, regular rhythm and normal heart sounds.   Pulmonary/Chest: Effort normal and breath sounds normal. No respiratory distress. She has no wheezes. She has no rales.  Abdominal: Soft. Bowel sounds are normal. She exhibits no distension. There is no tenderness. There is no rebound.  Musculoskeletal: She exhibits edema.       Pitting LE edema  Neurological: She is alert and oriented to person, place, and time.  Skin: Skin is warm and dry.  Psychiatric: She has a normal mood and affect.    ED Course  Procedures (including critical care time)  Pt seen, labs ordered.   Results for orders placed during the hospital encounter of 04/04/12  CBC      Component Value Range   WBC 16.3 (*) 4.0 - 10.5 (K/uL)   RBC 3.11 (*) 3.87 - 5.11 (MIL/uL)   Hemoglobin 6.8 (*) 12.0 - 15.0 (g/dL)   HCT 23.6 (*) 36.0 - 46.0 (%)   MCV 75.9 (*) 78.0 - 100.0 (fL)   MCH 21.9 (*) 26.0 - 34.0 (pg)   MCHC 28.8 (*) 30.0 - 36.0 (g/dL)   RDW 21.8 (*) 11.5 - 15.5 (%)   Platelets 405 (*) 150 - 400 (K/uL)  DIFFERENTIAL      Component Value Range   Neutrophils Relative 83 (*) 43 - 77 (%)   Neutro Abs 13.5 (*) 1.7 - 7.7 (K/uL)   Lymphocytes Relative 8 (*) 12 - 46 (%)   Lymphs Abs 1.2  0.7 - 4.0 (K/uL)   Monocytes Relative 5  3 - 12 (%)   Monocytes Absolute 0.8  0.1 - 1.0 (K/uL)   Eosinophils Relative 5  0 - 5 (%)   Eosinophils Absolute 0.8 (*) 0.0 - 0.7 (K/uL)   Basophils Relative 0  0 - 1 (%)    Basophils Absolute  0.0  0.0 - 0.1 (K/uL)   RBC Morphology POLYCHROMASIA PRESENT     Smear Review LARGE PLATELETS PRESENT    COMPREHENSIVE METABOLIC PANEL      Component Value Range   Sodium 136  135 - 145 (mEq/L)   Potassium 5.6 (*) 3.5 - 5.1 (mEq/L)   Chloride 110  96 - 112 (mEq/L)   CO2 17 (*) 19 - 32 (mEq/L)   Glucose, Bld 116 (*) 70 - 99 (mg/dL)   BUN 38 (*) 6 - 23 (mg/dL)   Creatinine, Ser 2.36 (*) 0.50 - 1.10 (mg/dL)   Calcium 8.6  8.4 - 10.5 (mg/dL)   Total Protein 8.4 (*) 6.0 - 8.3 (g/dL)   Albumin 2.1 (*) 3.5 - 5.2 (g/dL)   AST 8  0 - 37 (U/L)   ALT <5  0 - 35 (U/L)   Alkaline Phosphatase 140 (*) 39 - 117 (U/L)   Total Bilirubin 0.2 (*) 0.3 - 1.2 (mg/dL)   GFR calc non Af Amer 23 (*) >90 (mL/min)   GFR calc Af Amer 26 (*) >90 (mL/min)  PRO B NATRIURETIC PEPTIDE      Component Value Range   Pro B Natriuretic peptide (BNP) 9068.0 (*) 0 - 125 (pg/mL)  GLUCOSE, CAPILLARY      Component Value Range   Glucose-Capillary 145 (*) 70 - 99 (mg/dL)   Comment 1 Notify RN    TYPE AND SCREEN      Component Value Range   ABO/RH(D) B POS     Antibody Screen NEG     Sample Expiration 04/07/2012     Unit Number [id number]     Blood Component Type RED CELLS,LR     Unit division 00     Status of Unit ISSUED     Transfusion Status OK TO TRANSFUSE     Crossmatch Result Compatible    POCT I-STAT TROPONIN I      Component Value Range   Troponin i, poc 0.00  0.00 - 0.08 (ng/mL)   Comment 3           PREPARE RBC (CROSSMATCH)      Component Value Range   Order Confirmation ORDER PROCESSED BY BLOOD BANK     Dg Chest 2 View  04/04/2012  *RADIOLOGY REPORT*  Clinical Data: Shortness of breath, hypertension, CHF, diabetes  CHEST - 2 VIEW  Comparison: 01/19/2012  Findings: Enlargement of cardiac silhouette with pulmonary vascular congestion. No definite infiltrate or pleural effusion. No pneumothorax. Osseous structures unremarkable.  IMPRESSION: Enlargement of cardiac silhouette with pulmonary  vascular congestion. No acute abnormalities.  Original Report Authenticated By: Burnetta Sabin, M.D.    Pt with symptomatic anemia. Per chart review, renal insufficiency from prolonged antibiotic use (vancomycin and levaquin)for hydradenitis. Blood transfusion ordered. Also SOB, vascular congestion on CXR. BNP elevated. Suspect CHF exacerbation. Echo from [id number] showed systolic failure EF of [id number]. Lasix ordered. Will admit for further evaluation, treatment of pt's anemia, renal insufficiency.   3:23 PM Spoke with [facility]ad will admit   1. Acute on chronic systolic heart failure   2. Renal insufficiency   3. Anemia   4. Hyperkalemia   5. Shortness of breath       MDM          Renold Genta, PA 04/04/12 [place], PA 08/18/12 1524

## 2012-04-04 NOTE — H&P (Signed)
[name] MRN: [id number] DOB/AGE: September 07, 1961 51 y.o. Primary Care Physician:WHITE,CYNTHIA S, MD, MD Admit date: 04/04/2012 Chief Complaint: Shortness of breath HPI: *51 year old female, with morbid obesity, history of systolic heart failure , CK D. stage III presents with chief complaint of shortness of breath. She was seen at [facility] yesterday and was found to have a hemoglobin of 6.7. She was instructed to come to the ER for a transfusion. She has received previous transfusions because of her anemia of chronic disease. She states that she was as blood around her perirectal area because of her hidradenitis. She has not noticed any frank melena or hematochezia. She denies any hematemesis. She has not had an EGD or a colonoscopy in the past. She denies any chest pain but does complain of dyspnea on exertion, orthopnea, dependent edema, more so in the last couple of weeks . Her creatinine has been increasing over the course of the last several months. She was followed by Dr. Drucilla Schmidt at the infectious disease clinic up until March of this year, when the patient was receiving IV vancomycin for her hidradenitis. She also received levofloxacin following discontinuation of her vancomycin. Because of her increasing creatinine Levaquin was also discontinued. She has been off all antibiotics for the last 2 months.  She continues to have a foul-smelling mucopurulent discharge from her groin area .  Past Medical History  Diagnosis Date  . Systolic congestive heart failure   . SOB (shortness of breath)   . HTN (hypertension)   . DM type 2 (diabetes mellitus, type 2)   . Chronic kidney disease (CKD), stage III (moderate)   . Iron deficiency anemia   . Morbid obesity   . Hyperkalemia   . Hypothyroidism   . CHF (congestive heart failure)     Past Surgical History  Procedure Date  . Portacath placement     Prior to Admission medications   Medication Sig Start Date End Date Taking?  Authorizing Provider  ascorbic acid (VITAMIN C) 500 MG tablet Take 500 mg by mouth 2 (two) times daily.     Yes Historical Provider, MD  aspirin EC 81 MG EC tablet Take 1 tablet (81 mg total) by mouth daily. 01/30/12 01/29/13 Yes Ripudeep Krystal Eaton, MD  carvedilol (COREG) 3.125 MG tablet Take 6.25 mg by mouth 2 (two) times daily with a meal. Take one tab by mouth with food twice a day   Yes Historical Provider, MD  DULoxetine (CYMBALTA) 60 MG capsule Take 60 mg by mouth daily.     Yes Historical Provider, MD  ferrous sulfate 325 (65 FE) MG tablet Take 325 mg by mouth 2 (two) times daily.     Yes Historical Provider, MD  folic acid (FOLVITE) 1 MG tablet Take 1 mg by mouth daily.   Yes Historical Provider, MD  furosemide (LASIX) 20 MG tablet Take 60 mg by mouth 2 (two) times daily.  01/30/12 01/29/13 Yes Ripudeep Krystal Eaton, MD  insulin lispro protamine-insulin lispro (HUMALOG 75/25) (75-25) 100 UNIT/ML SUSP Give 35 units subq every morning and 20 units sub q every PM    Yes Historical Provider, MD  levothyroxine (SYNTHROID, LEVOTHROID) 25 MCG tablet Take 25 mcg by mouth daily.   Yes Historical Provider, MD  magnesium oxide (MAG-OX) 400 MG tablet Take 400 mg by mouth 2 (two) times daily.    Yes Historical Provider, MD  oxyCODONE-acetaminophen (PERCOCET) 5-325 MG per tablet Take 1 tablet by mouth every 6 (six) hours as needed. For pain  Yes Historical Provider, MD  pravastatin (PRAVACHOL) 40 MG tablet Take 40 mg by mouth daily.     Yes Historical Provider, MD  zinc sulfate 220 MG capsule Take 220 mg by mouth daily.   Yes Historical Provider, MD    Allergies:  Allergies  Allergen Reactions  . Amoxicillin-Pot Clavulanate Diarrhea  . Rosiglitazone Maleate     REACTION: swelling  . Amoxicillin Rash    Family History  Problem Relation Age of Onset  . Coronary artery disease Mother   . Hypertension Mother   . Diabetes type II Mother   . Malignant hyperthermia Mother   . Coronary artery disease Father   .  Hypertension Father   . Malignant hyperthermia Father   . Cancer Maternal Grandfather     ? Type    Social History:  reports that she has never smoked. She has never used smokeless tobacco. She reports that she does not drink alcohol or use illicit drugs.         ROS: A complete 14 point review of systems was done and pertinent positives listed in history of present illness  PHYSICAL EXAM: Blood pressure 136/65, pulse 67, temperature 98.1 F (36.7 C), temperature source Oral, resp. rate 16, height 5\' 6"  (1.676 m), weight 172.367 kg (380 lb), SpO2 96.00%. General: Alert and awake oriented x3 not in any acute distress.  HEENT: anicteric sclera, pupils reactive to light and accommodation  CVS: S1-S2 clear no murmur rubs or gallops  Chest: difficult to auscultate breath sounds due to body habitus however no wheezing or rhonchi bilaterally  Abdomen: morbid obesity soft nontender, nondistended, normal bowel sounds, no organomegaly , foul-smelling orders discharge in the groin area Extremities: no cyanosis, clubbing or edema noted bilaterally  Neuro: Cranial nerves II-XII intact, no focal neurological deficits   No results found for this or any previous visit (from the past 240 hour(s)).   Results for orders placed during the hospital encounter of 04/04/12 (from the past 48 hour(s))  GLUCOSE, CAPILLARY     Status: Abnormal   Collection Time   04/04/12 12:47 PM      Component Value Range Comment   Glucose-Capillary 145 (*) 70 - 99 (mg/dL)    Comment 1 Notify RN     CBC     Status: Abnormal   Collection Time   04/04/12 12:55 PM      Component Value Range Comment   WBC 16.3 (*) 4.0 - 10.5 (K/uL)    RBC 3.11 (*) 3.87 - 5.11 (MIL/uL)    Hemoglobin 6.8 (*) 12.0 - 15.0 (g/dL)    HCT 23.6 (*) 36.0 - 46.0 (%)    MCV 75.9 (*) 78.0 - 100.0 (fL)    MCH 21.9 (*) 26.0 - 34.0 (pg)    MCHC 28.8 (*) 30.0 - 36.0 (g/dL)    RDW 21.8 (*) 11.5 - 15.5 (%)    Platelets 405 (*) 150 - 400 (K/uL)     DIFFERENTIAL     Status: Abnormal   Collection Time   04/04/12 12:55 PM      Component Value Range Comment   Neutrophils Relative 83 (*) 43 - 77 (%)    Neutro Abs 13.5 (*) 1.7 - 7.7 (K/uL)    Lymphocytes Relative 8 (*) 12 - 46 (%)    Lymphs Abs 1.2  0.7 - 4.0 (K/uL)    Monocytes Relative 5  3 - 12 (%)    Monocytes Absolute 0.8  0.1 - 1.0 (K/uL)  Eosinophils Relative 5  0 - 5 (%)    Eosinophils Absolute 0.8 (*) 0.0 - 0.7 (K/uL)    Basophils Relative 0  0 - 1 (%)    Basophils Absolute 0.0  0.0 - 0.1 (K/uL)    RBC Morphology POLYCHROMASIA PRESENT      Smear Review LARGE PLATELETS PRESENT     COMPREHENSIVE METABOLIC PANEL     Status: Abnormal   Collection Time   04/04/12 12:55 PM      Component Value Range Comment   Sodium 136  135 - 145 (mEq/L)    Potassium 5.6 (*) 3.5 - 5.1 (mEq/L)    Chloride 110  96 - 112 (mEq/L)    CO2 17 (*) 19 - 32 (mEq/L)    Glucose, Bld 116 (*) 70 - 99 (mg/dL)    BUN 38 (*) 6 - 23 (mg/dL)    Creatinine, Ser 2.36 (*) 0.50 - 1.10 (mg/dL)    Calcium 8.6  8.4 - 10.5 (mg/dL)    Total Protein 8.4 (*) 6.0 - 8.3 (g/dL)    Albumin 2.1 (*) 3.5 - 5.2 (g/dL)    AST 8  0 - 37 (U/L)    ALT <5  0 - 35 (U/L) REPEATED TO VERIFY   Alkaline Phosphatase 140 (*) 39 - 117 (U/L)    Total Bilirubin 0.2 (*) 0.3 - 1.2 (mg/dL)    GFR calc non Af Amer 23 (*) >90 (mL/min)    GFR calc Af Amer 26 (*) >90 (mL/min)   PRO B NATRIURETIC PEPTIDE     Status: Abnormal   Collection Time   04/04/12 12:55 PM      Component Value Range Comment   Pro B Natriuretic peptide (BNP) 9068.0 (*) 0 - 125 (pg/mL)   POCT I-STAT TROPONIN I     Status: Normal   Collection Time   04/04/12  1:13 PM      Component Value Range Comment   Troponin i, poc 0.00  0.00 - 0.08 (ng/mL)    Comment 3            TYPE AND SCREEN     Status: Normal (Preliminary result)   Collection Time   04/04/12  1:18 PM      Component Value Range Comment   ABO/RH(D) B POS      Antibody Screen NEG      Sample Expiration 04/07/2012       Unit Number [id number]      Blood Component Type RED CELLS,LR      Unit division 00      Status of Unit ISSUED      Transfusion Status OK TO TRANSFUSE      Crossmatch Result Compatible     PREPARE RBC (CROSSMATCH)     Status: Normal   Collection Time   04/04/12  2:30 PM      Component Value Range Comment   Order Confirmation ORDER PROCESSED BY BLOOD BANK     URINALYSIS, ROUTINE W REFLEX MICROSCOPIC     Status: Abnormal   Collection Time   04/04/12  2:50 PM      Component Value Range Comment   Color, Urine YELLOW  YELLOW     APPearance CLOUDY (*) CLEAR     Specific Gravity, Urine 1.016  1.005 - 1.030     pH 5.5  5.0 - 8.0     Glucose, UA NEGATIVE  NEGATIVE (mg/dL)    Hgb urine dipstick SMALL (*) NEGATIVE     Bilirubin  Urine NEGATIVE  NEGATIVE     Ketones, ur NEGATIVE  NEGATIVE (mg/dL)    Protein, ur NEGATIVE  NEGATIVE (mg/dL)    Urobilinogen, UA 0.2  0.0 - 1.0 (mg/dL)    Nitrite NEGATIVE  NEGATIVE     Leukocytes, UA MODERATE (*) NEGATIVE    URINE MICROSCOPIC-ADD ON     Status: Normal   Collection Time   04/04/12  2:50 PM      Component Value Range Comment   Squamous Epithelial / LPF RARE  RARE     WBC, UA 11-20  <3 (WBC/hpf) WITH CLUMPS   RBC / HPF 3-6  <3 (RBC/hpf)    Bacteria, UA RARE  RARE      Dg Chest 2 View  04/04/2012  *RADIOLOGY REPORT*  Clinical Data: Shortness of breath, hypertension, CHF, diabetes  CHEST - 2 VIEW  Comparison: 01/19/2012  Findings: Enlargement of cardiac silhouette with pulmonary vascular congestion. No definite infiltrate or pleural effusion. No pneumothorax. Osseous structures unremarkable.  IMPRESSION: Enlargement of cardiac silhouette with pulmonary vascular congestion. No acute abnormalities.  Original Report Authenticated By: Burnetta Sabin, M.D.    Impression:  Active Problems:  Morbid obesity  HIDRADENITIS SUPPURATIVA  Systolic congestive heart failure  SOB (shortness of breath)  DM type 2 (diabetes mellitus, type 2)  Iron deficiency  anemia  Hyperkalemia  Acute on chronic systolic heart failure     Plan: #1 anemia no obvious blood loss, no source of bleeding, likely anemia of chronic disease. We'll check an anemia panel, PT/INR, transfuse for hemoglobin greater than 8 which is her baseline. She will need an outpatient workup including a screening colonoscopy at some point. #2 CK D. stage III seen by nephrology yesterday, baseline creatinine is around 2.5 . Patient will be treated with IV Lasix for her CHF exacerbation, we will have to monitor her kidney function closely, hold off on an ACE inhibitor for the same reason #3 acute on chronic systolic heart failure last 2-D echo was obtained in January 2011 that showed an EF of 35-40% of Continue IV Lasix #4 hyperkalemia treatment with Kayexalate Full code   [facility] 04/04/2012, 5:51 PM

## 2012-04-04 NOTE — ED Notes (Signed)
PA at bedside.

## 2012-04-04 NOTE — ED Notes (Signed)
Patient unwilling to have second IV started.  First unit of blood being hung by previous shift nurse upon taking assignment.  Lasix given previous to first unit of blood being hung because pt. Refusing second IV.  Calcium gluconate held until first unit of blood complete.  Also glucose held until after first unit infused.

## 2012-04-04 NOTE — ED Notes (Signed)
Pt states "went to the doctor Tuesday, Hgb is 6.7, I usually get a blood transfusion, have CHF, haven't been taking the lasix like I'm supposed to"

## 2012-04-04 NOTE — Progress Notes (Signed)
Pt meets requirements for bariatric bed, pt refuses bariatric bed and prefers standard hospital bed.  [name]. Brigitte Pulse, RN

## 2012-04-05 DIAGNOSIS — D649 Anemia, unspecified: Secondary | ICD-10-CM

## 2012-04-05 DIAGNOSIS — I5021 Acute systolic (congestive) heart failure: Secondary | ICD-10-CM

## 2012-04-05 DIAGNOSIS — L732 Hidradenitis suppurativa: Secondary | ICD-10-CM

## 2012-04-05 LAB — COMPREHENSIVE METABOLIC PANEL
AST: 7 U/L (ref 0–37)
Alkaline Phosphatase: 138 U/L — ABNORMAL HIGH (ref 39–117)
CO2: 18 mEq/L — ABNORMAL LOW (ref 19–32)
Chloride: 109 mEq/L (ref 96–112)
Creatinine, Ser: 2.39 mg/dL — ABNORMAL HIGH (ref 0.50–1.10)
GFR calc non Af Amer: 23 mL/min — ABNORMAL LOW (ref >90)
Potassium: 5.5 mEq/L — ABNORMAL HIGH (ref 3.5–5.1)
Total Bilirubin: 0.3 mg/dL (ref 0.3–1.2)

## 2012-04-05 LAB — FERRITIN: Ferritin: 48 ng/mL (ref 10–291)

## 2012-04-05 LAB — CBC
MCH: 21.7 pg — ABNORMAL LOW (ref 26.0–34.0)
MCHC: 28 g/dL — ABNORMAL LOW (ref 30.0–36.0)
Platelets: 369 10*3/uL (ref 150–400)
RBC: 3.22 MIL/uL — ABNORMAL LOW (ref 3.87–5.11)
RDW: 21.8 % — ABNORMAL HIGH (ref 11.5–15.5)

## 2012-04-05 LAB — IRON AND TIBC: UIBC: 214 ug/dL (ref 125–400)

## 2012-04-05 LAB — PREPARE RBC (CROSSMATCH)

## 2012-04-05 LAB — TSH: TSH: 4.041 u[IU]/mL (ref 0.350–4.500)

## 2012-04-05 LAB — PROTIME-INR: Prothrombin Time: 17.4 seconds — ABNORMAL HIGH (ref 11.6–15.2)

## 2012-04-05 LAB — CARDIAC PANEL(CRET KIN+CKTOT+MB+TROPI): Relative Index: INVALID (ref 0.0–2.5)

## 2012-04-05 MED ORDER — FLUCONAZOLE 100MG IVPB
100.0000 mg | INTRAVENOUS | Status: DC
  Administered 2012-04-06: 100 mg via INTRAVENOUS
  Filled 2012-04-05 (×5): qty 50

## 2012-04-05 MED ORDER — SODIUM POLYSTYRENE SULFONATE 15 GM/60ML PO SUSP
30.0000 g | Freq: Once | ORAL | Status: DC
  Filled 2012-04-05: qty 120

## 2012-04-05 MED ORDER — FERROUS SULFATE 325 (65 FE) MG PO TABS: 325.0000 mg | ORAL_TABLET | Freq: Every day | ORAL | Status: DC

## 2012-04-05 MED ORDER — IPRATROPIUM BROMIDE 0.02 % IN SOLN
0.5000 mg | Freq: Three times a day (TID) | RESPIRATORY_TRACT | Status: DC
  Administered 2012-04-05: 0.5 mg via RESPIRATORY_TRACT
  Filled 2012-04-05: qty 2.5

## 2012-04-05 MED ORDER — ALBUTEROL SULFATE (5 MG/ML) 0.5% IN NEBU: 2.5000 mg | INHALATION_SOLUTION | RESPIRATORY_TRACT | Status: DC | PRN

## 2012-04-05 MED ORDER — PRO-STAT SUGAR FREE PO LIQD
30.0000 mL | Freq: Two times a day (BID) | ORAL | Status: DC
  Administered 2012-04-05: 30 mL via ORAL
  Filled 2012-04-05 (×6): qty 30

## 2012-04-05 MED ORDER — FUROSEMIDE 10 MG/ML IJ SOLN
60.0000 mg | Freq: Two times a day (BID) | INTRAMUSCULAR | Status: DC
  Administered 2012-04-05 – 2012-04-07 (×5): 60 mg via INTRAVENOUS
  Filled 2012-04-05 (×7): qty 6

## 2012-04-05 NOTE — Consult Note (Signed)
.  WOC consult Note Reason for Consult: questionable hydradenitis vs. Candida.  Discussed with pts bedside nurse. Pt has erythema under pannus and some skin sloughing/irriation.  Does not present with any distinguishable lesions with drainage like hydradenitis.   Wound type: intertriginous dermatitis under pannus  drainage (amount, consistency, odor) minimal with odor per bedside nursing staff dressing procedure/placement/frequency: will order InterDry Ag+ wicking material for moisture management under pannus and with antimicrobial properties to tx possible candida.  Gave bedside nurse lawson # to order product and specific instructions on application and usage, as well orders placed for use in pts record.   Re consult if needed, will not follow at this time. Thanks  Shalina Norfolk [employer], [place] [phone])

## 2012-04-05 NOTE — Progress Notes (Signed)
Patient was admitted for shortness of breath and morbid obesity. She is on 2L nasal cannula and room air alternating. She has no home respiratory medications and has no pulmonary history. She is a nonsmoker. RT will change Atrovent to TID and keep her Albuterol Q2 PRN.

## 2012-04-05 NOTE — Progress Notes (Signed)
INITIAL ADULT NUTRITION ASSESSMENT Date: July 21, 202013   Time: 11:25 AM Reason for Assessment: Low braden  ASSESSMENT: Female 51 y.o.  Dx: SOB  Hx:  Past Medical History  Diagnosis Date  . Systolic congestive heart failure   . SOB (shortness of breath)   . HTN (hypertension)   . DM type 2 (diabetes mellitus, type 2)   . Chronic kidney disease (CKD), stage III (moderate)   . Iron deficiency anemia   . Morbid obesity   . Hyperkalemia   . Hypothyroidism   . CHF (congestive heart failure)     Related Meds:  Scheduled Meds:   . aspirin EC  81 mg Oral Daily  . calcium gluconate  1 g Intravenous Once  . carvedilol  6.25 mg Oral BID WC  . dextrose  1 ampule Intravenous Once  . docusate sodium  100 mg Oral BID  . DULoxetine  60 mg Oral Daily  . enoxaparin (LOVENOX) injection  40 mg Subcutaneous Q24H  . ferrous sulfate  325 mg Oral BID  . furosemide  60 mg Intravenous Once  . furosemide  60 mg Intravenous Once  . furosemide  60 mg Intravenous BID  . ipratropium  0.5 mg Nebulization TID  . levothyroxine  25 mcg Oral Daily  . senna  1 tablet Oral BID  . simvastatin  5 mg Oral q1800  . sodium chloride  3 mL Intravenous Q12H  . sodium chloride  3 mL Intravenous Q12H  . sodium polystyrene  30 g Oral Once  . DISCONTD: enoxaparin  30 mg Subcutaneous Q24H  . DISCONTD: insulin aspart  5 Units Intravenous Once  . DISCONTD: insulin regular  5 Units Intravenous Once  . DISCONTD: ipratropium  0.5 mg Nebulization Q6H   Continuous Infusions:  PRN Meds:.sodium chloride, acetaminophen, acetaminophen, albuterol, ondansetron (ZOFRAN) IV, ondansetron, oxyCODONE, sodium chloride, DISCONTD: ondansetron (ZOFRAN) IV   Ht: 5\' 6"  (167.6 cm)  Wt: 380 lb (172.367 kg)  Ideal Wt: 59 kg % Ideal Wt: 292.3% Wt Readings from Last 10 Encounters:  04/04/12 380 lb (172.367 kg)  03/07/12 338 lb (153.316 kg)  01/30/12 357 lb 12.9 oz (162.3 kg)  08/03/11 340 lb (154.223 kg)  07/26/11 340 lb (154.223 kg)   05/22/11 320 lb 4 oz (145.264 kg)  02/08/11 277 lb 1.9 oz (125.7 kg)  01/23/11 293 lb (132.904 kg)  12/07/10 284 lb (128.822 kg)  11/10/10 268 lb 3.2 oz (121.655 kg)    Body mass index is 61.33 kg/(m^2). (Extreme Obesity class III)  Food/Nutrition Related Hx: Patient reported good appetite and intake. She reported to eat 100% of meals most of the time. Noted patient with abdomen wound.   Labs:  CMP     Component Value Date/Time   NA 136 July 21, 202013 0138   K 5.5* July 21, 202013 0138   CL 109 July 21, 202013 0138   CO2 18* July 21, 202013 0138   GLUCOSE 93 July 21, 202013 0138   BUN 38* July 21, 202013 0138   CREATININE 2.39* July 21, 202013 0138   CREATININE 1.90* 03/07/2012 1652   CALCIUM 8.4 July 21, 202013 0138   PROT 8.2 July 21, 202013 0138   ALBUMIN 2.0* July 21, 202013 0138   AST 7 July 21, 202013 0138   ALT <5 July 21, 202013 0138   ALKPHOS 138* July 21, 202013 0138   BILITOT 0.3 July 21, 202013 0138   GFRNONAA 23* July 21, 202013 0138   GFRAA 26* July 21, 202013 0138    Intake/Output Summary (Last 24 hours) at 04/05/12 1130 Last data filed at 04/05/12 1030  Gross per 24 hour  Intake  972.5 ml  Output  4400 ml  Net -3427.5 ml     Diet Order: Carb Control  Supplements/Tube Feeding: none at this time  IVF:    Estimated Nutritional Needs:   Kcal: 1800-2100 Protein: 84-105 grams  Fluid: 1 ml per kcal intake  NUTRITION DIAGNOSIS: -Increased nutrient needs (NI-5.1).  Status: Ongoing  RELATED TO: wound healing  AS EVIDENCE BY: patient with abdomen wound with drainage.   MONITORING/EVALUATION(Goals): PO intake, skin, labs, weight trends 1. PO intake > 75% at meals.  EDUCATION NEEDS: -No education needs identified at this time  INTERVENTION: 1. Will order patient prostat to provide an additional 200 kcal and 30 grams of protein daily. 2. RD to follow for nutrition plan of care.    Dietitian [phone]  [place] Per approved criteria  -Morbid Obesity    Loyce Dys [facility] 06-14-202013, 11:25 AM

## 2012-04-05 NOTE — Progress Notes (Signed)
   CARE MANAGEMENT NOTE 2020-04-1912  Patient:  [facility]   Account Number:  [id number]  Date Initiated:  02020-04-1912  Documentation initiated by:  Olga Coaster  Subjective/Objective Assessment:   ADMITTED WITH SOB, HGB 6.2     Action/Plan:   PCP IS DR WHITE; LIVES AT [place]   Anticipated DC Date:  04/12/2012   Anticipated DC Plan:  HOME/SELF CARE          Status of service:  In process, will continue to follow Medicare Important Message given?  NA - LOS <3 / Initial given by admissions (If response is "NO", the following Medicare IM given date fields will be blank)  Discharge Disposition: HOME   Per UR Regulation:  Reviewed for med. necessity/level of care/duration of stay  If discussed at [place] of Stay Meetings, dates discussed:    Comments:  2020-04-1912- B Ezekiel Menzer RN, BSN, MHA

## 2012-04-05 NOTE — Progress Notes (Signed)
Subjective: Shortness of breath has improved since yesterday Urine output has been good  Objective: Vital signs in last 24 hours: Filed Vitals:   04/05/12 0926 04/05/12 1000 04/05/12 1030 04/05/12 1125  BP:  136/82 125/73 121/73  Pulse:  80 78 76  Temp:  98.4 F (36.9 C) 98.9 F (37.2 C) 98.7 F (37.1 C)  TempSrc:  Oral Oral Oral  Resp:  20 20 20   Height:      Weight:      SpO2: 99%       Intake/Output Summary (Last 24 hours) at 04/05/12 1135 Last data filed at 04/05/12 1030  Gross per 24 hour  Intake  972.5 ml  Output   4400 ml  Net -3427.5 ml    Weight change:   General: Alert and awake oriented x3 not in any acute distress.  HEENT: anicteric sclera, pupils reactive to light and accommodation  CVS: S1-S2 clear no murmur rubs or gallops  Chest: difficult to auscultate breath sounds due to body habitus however no wheezing or rhonchi bilaterally  Abdomen: morbid obesity soft nontender, nondistended, normal bowel sounds, no organomegaly , foul-smelling orders discharge in the groin area  Extremities: no cyanosis, clubbing or edema noted bilaterally  Neuro: Cranial nerves II-XII intact, no focal neurological deficits   Lab Results: Results for orders placed during the hospital encounter of 04/04/12 (from the past 24 hour(s))  GLUCOSE, CAPILLARY     Status: Abnormal   Collection Time   04/04/12 12:47 PM      Component Value Range   Glucose-Capillary 145 (*) 70 - 99 (mg/dL)   Comment 1 Notify RN    CBC     Status: Abnormal   Collection Time   04/04/12 12:55 PM      Component Value Range   WBC 16.3 (*) 4.0 - 10.5 (K/uL)   RBC 3.11 (*) 3.87 - 5.11 (MIL/uL)   Hemoglobin 6.8 (*) 12.0 - 15.0 (g/dL)   HCT 23.6 (*) 36.0 - 46.0 (%)   MCV 75.9 (*) 78.0 - 100.0 (fL)   MCH 21.9 (*) 26.0 - 34.0 (pg)   MCHC 28.8 (*) 30.0 - 36.0 (g/dL)   RDW 21.8 (*) 11.5 - 15.5 (%)   Platelets 405 (*) 150 - 400 (K/uL)  DIFFERENTIAL     Status: Abnormal   Collection Time   04/04/12 12:55 PM    Component Value Range   Neutrophils Relative 83 (*) 43 - 77 (%)   Neutro Abs 13.5 (*) 1.7 - 7.7 (K/uL)   Lymphocytes Relative 8 (*) 12 - 46 (%)   Lymphs Abs 1.2  0.7 - 4.0 (K/uL)   Monocytes Relative 5  3 - 12 (%)   Monocytes Absolute 0.8  0.1 - 1.0 (K/uL)   Eosinophils Relative 5  0 - 5 (%)   Eosinophils Absolute 0.8 (*) 0.0 - 0.7 (K/uL)   Basophils Relative 0  0 - 1 (%)   Basophils Absolute 0.0  0.0 - 0.1 (K/uL)   RBC Morphology POLYCHROMASIA PRESENT     Smear Review LARGE PLATELETS PRESENT    COMPREHENSIVE METABOLIC PANEL     Status: Abnormal   Collection Time   04/04/12 12:55 PM      Component Value Range   Sodium 136  135 - 145 (mEq/L)   Potassium 5.6 (*) 3.5 - 5.1 (mEq/L)   Chloride 110  96 - 112 (mEq/L)   CO2 17 (*) 19 - 32 (mEq/L)   Glucose, Bld 116 (*) 70 - 99 (  mg/dL)   BUN 38 (*) 6 - 23 (mg/dL)   Creatinine, Ser 2.36 (*) 0.50 - 1.10 (mg/dL)   Calcium 8.6  8.4 - 10.5 (mg/dL)   Total Protein 8.4 (*) 6.0 - 8.3 (g/dL)   Albumin 2.1 (*) 3.5 - 5.2 (g/dL)   AST 8  0 - 37 (U/L)   ALT <5  0 - 35 (U/L)   Alkaline Phosphatase 140 (*) 39 - 117 (U/L)   Total Bilirubin 0.2 (*) 0.3 - 1.2 (mg/dL)   GFR calc non Af Amer 23 (*) >90 (mL/min)   GFR calc Af Amer 26 (*) >90 (mL/min)  PRO B NATRIURETIC PEPTIDE     Status: Abnormal   Collection Time   04/04/12 12:55 PM      Component Value Range   Pro B Natriuretic peptide (BNP) 9068.0 (*) 0 - 125 (pg/mL)  POCT I-STAT TROPONIN I     Status: Normal   Collection Time   04/04/12  1:13 PM      Component Value Range   Troponin i, poc 0.00  0.00 - 0.08 (ng/mL)   Comment 3           TYPE AND SCREEN     Status: Normal (Preliminary result)   Collection Time   04/04/12  1:18 PM      Component Value Range   ABO/RH(D) B POS     Antibody Screen NEG     Sample Expiration 04/07/2012     Unit Number [id number]     Blood Component Type RED CELLS,LR     Unit division 00     Status of Unit ISSUED,FINAL     Transfusion Status OK TO TRANSFUSE      Crossmatch Result Compatible     Unit Number [id number]     Blood Component Type RED CELLS,LR     Unit division 00     Status of Unit ISSUED     Transfusion Status OK TO TRANSFUSE     Crossmatch Result Compatible     Unit Number [id number]     Blood Component Type RED CELLS,LR     Unit division 00     Status of Unit ISSUED     Transfusion Status OK TO TRANSFUSE     Crossmatch Result Compatible    PREPARE RBC (CROSSMATCH)     Status: Normal   Collection Time   04/04/12  2:30 PM      Component Value Range   Order Confirmation ORDER PROCESSED BY BLOOD BANK    URINALYSIS, ROUTINE W REFLEX MICROSCOPIC     Status: Abnormal   Collection Time   04/04/12  2:50 PM      Component Value Range   Color, Urine YELLOW  YELLOW    APPearance CLOUDY (*) CLEAR    Specific Gravity, Urine 1.016  1.005 - 1.030    pH 5.5  5.0 - 8.0    Glucose, UA NEGATIVE  NEGATIVE (mg/dL)   Hgb urine dipstick SMALL (*) NEGATIVE    Bilirubin Urine NEGATIVE  NEGATIVE    Ketones, ur NEGATIVE  NEGATIVE (mg/dL)   Protein, ur NEGATIVE  NEGATIVE (mg/dL)   Urobilinogen, UA 0.2  0.0 - 1.0 (mg/dL)   Nitrite NEGATIVE  NEGATIVE    Leukocytes, UA MODERATE (*) NEGATIVE   URINE MICROSCOPIC-ADD ON     Status: Normal   Collection Time   04/04/12  2:50 PM      Component Value Range   Squamous Epithelial / LPF RARE  RARE    WBC, UA 11-20  <3 (WBC/hpf)   RBC / HPF 3-6  <3 (RBC/hpf)   Bacteria, UA RARE  RARE   VITAMIN B12     Status: Abnormal   Collection Time   04/04/12  7:00 PM      Component Value Range   Vitamin B-12 1055 (*) 211 - 911 (pg/mL)  FOLATE     Status: Normal   Collection Time   04/04/12  7:00 PM      Component Value Range   Folate >20.0    IRON AND TIBC     Status: Abnormal   Collection Time   04/04/12  7:00 PM      Component Value Range   Iron 16 (*) 42 - 135 (ug/dL)   TIBC 230 (*) 250 - 470 (ug/dL)   Saturation Ratios 7 (*) 20 - 55 (%)   UIBC 214  125 - 400 (ug/dL)  FERRITIN     Status: Normal   Collection Time     04/04/12  7:00 PM      Component Value Range   Ferritin 48  10 - 291 (ng/mL)  RETICULOCYTES     Status: Abnormal   Collection Time   04/04/12  7:00 PM      Component Value Range   Retic Ct Pct 3.1  0.4 - 3.1 (%)   RBC. 3.26 (*) 3.87 - 5.11 (MIL/uL)   Retic Count, Manual 101.1  19.0 - 186.0 (K/uL)  CBC     Status: Abnormal   Collection Time   04/04/12  7:00 PM      Component Value Range   WBC 15.7 (*) 4.0 - 10.5 (K/uL)   RBC 3.26 (*) 3.87 - 5.11 (MIL/uL)   Hemoglobin 7.3 (*) 12.0 - 15.0 (g/dL)   HCT 25.1 (*) 36.0 - 46.0 (%)   MCV 77.0 (*) 78.0 - 100.0 (fL)   MCH 22.4 (*) 26.0 - 34.0 (pg)   MCHC 29.1 (*) 30.0 - 36.0 (g/dL)   RDW 21.6 (*) 11.5 - 15.5 (%)   Platelets 389  150 - 400 (K/uL)  MAGNESIUM     Status: Normal   Collection Time   04/04/12  7:00 PM      Component Value Range   Magnesium 1.9  1.5 - 2.5 (mg/dL)  TSH     Status: Normal   Collection Time   04/04/12  7:00 PM      Component Value Range   TSH 4.041  0.350 - 4.500 (uIU/mL)  CARDIAC PANEL(CRET KIN+CKTOT+MB+TROPI)     Status: Normal   Collection Time   04/04/12  7:00 PM      Component Value Range   Total CK 56  7 - 177 (U/L)   CK, MB 2.7  0.3 - 4.0 (ng/mL)   Troponin I <0.30  <0.30 (ng/mL)   Relative Index RELATIVE INDEX IS INVALID  0.0 - 2.5   CARDIAC PANEL(CRET KIN+CKTOT+MB+TROPI)     Status: Normal   Collection Time   04/05/12  1:38 AM      Component Value Range   Total CK 51  7 - 177 (U/L)   CK, MB 2.5  0.3 - 4.0 (ng/mL)   Troponin I <0.30  <0.30 (ng/mL)   Relative Index RELATIVE INDEX IS INVALID  0.0 - 2.5   COMPREHENSIVE METABOLIC PANEL     Status: Abnormal   Collection Time   04/05/12  1:38 AM      Component Value Range  Sodium 136  135 - 145 (mEq/L)   Potassium 5.5 (*) 3.5 - 5.1 (mEq/L)   Chloride 109  96 - 112 (mEq/L)   CO2 18 (*) 19 - 32 (mEq/L)   Glucose, Bld 93  70 - 99 (mg/dL)   BUN 38 (*) 6 - 23 (mg/dL)   Creatinine, Ser 2.39 (*) 0.50 - 1.10 (mg/dL)   Calcium 8.4  8.4 - 10.5 (mg/dL)   Total  Protein 8.2  6.0 - 8.3 (g/dL)   Albumin 2.0 (*) 3.5 - 5.2 (g/dL)   AST 7  0 - 37 (U/L)   ALT <5  0 - 35 (U/L)   Alkaline Phosphatase 138 (*) 39 - 117 (U/L)   Total Bilirubin 0.3  0.3 - 1.2 (mg/dL)   GFR calc non Af Amer 23 (*) >90 (mL/min)   GFR calc Af Amer 26 (*) >90 (mL/min)  CBC     Status: Abnormal   Collection Time   04/05/12  1:38 AM      Component Value Range   WBC 17.0 (*) 4.0 - 10.5 (K/uL)   RBC 3.22 (*) 3.87 - 5.11 (MIL/uL)   Hemoglobin 7.0 (*) 12.0 - 15.0 (g/dL)   HCT 25.0 (*) 36.0 - 46.0 (%)   MCV 77.6 (*) 78.0 - 100.0 (fL)   MCH 21.7 (*) 26.0 - 34.0 (pg)   MCHC 28.0 (*) 30.0 - 36.0 (g/dL)   RDW 21.8 (*) 11.5 - 15.5 (%)   Platelets 369  150 - 400 (K/uL)  PROTIME-INR     Status: Abnormal   Collection Time   04/05/12  1:38 AM      Component Value Range   Prothrombin Time 17.4 (*) 11.6 - 15.2 (seconds)   INR 1.40  0.00 - 1.49   PREPARE RBC (CROSSMATCH)     Status: Normal   Collection Time   04/05/12  5:00 AM      Component Value Range   Order Confirmation ORDER PROCESSED BY BLOOD BANK    PREPARE RBC (CROSSMATCH)     Status: Normal   Collection Time   04/05/12  9:30 AM      Component Value Range   Order Confirmation ORDER PROCESSED BY BLOOD BANK       Micro: No results found for this or any previous visit (from the past 240 hour(s)).  Studies/Results: Dg Chest 2 View  04/04/2012  *RADIOLOGY REPORT*  Clinical Data: Shortness of breath, hypertension, CHF, diabetes  CHEST - 2 VIEW  Comparison: 01/19/2012  Findings: Enlargement of cardiac silhouette with pulmonary vascular congestion. No definite infiltrate or pleural effusion. No pneumothorax. Osseous structures unremarkable.  IMPRESSION: Enlargement of cardiac silhouette with pulmonary vascular congestion. No acute abnormalities.  Original Report Authenticated By: Burnetta Sabin, M.D.    Medications:  Scheduled Meds:   . aspirin EC  81 mg Oral Daily  . calcium gluconate  1 g Intravenous Once  . carvedilol  6.25  mg Oral BID WC  . dextrose  1 ampule Intravenous Once  . docusate sodium  100 mg Oral BID  . DULoxetine  60 mg Oral Daily  . enoxaparin (LOVENOX) injection  40 mg Subcutaneous Q24H  . feeding supplement  30 mL Oral BID AC  . ferrous sulfate  325 mg Oral BID  . furosemide  60 mg Intravenous Once  . furosemide  60 mg Intravenous Once  . furosemide  60 mg Intravenous BID  . ipratropium  0.5 mg Nebulization TID  . levothyroxine  25 mcg Oral  Daily  . senna  1 tablet Oral BID  . simvastatin  5 mg Oral q1800  . sodium chloride  3 mL Intravenous Q12H  . sodium chloride  3 mL Intravenous Q12H  . sodium polystyrene  30 g Oral Once  . sodium polystyrene  30 g Oral Once  . DISCONTD: enoxaparin  30 mg Subcutaneous Q24H  . DISCONTD: insulin aspart  5 Units Intravenous Once  . DISCONTD: insulin regular  5 Units Intravenous Once  . DISCONTD: ipratropium  0.5 mg Nebulization Q6H   Continuous Infusions:  PRN Meds:.sodium chloride, acetaminophen, acetaminophen, albuterol, ondansetron (ZOFRAN) IV, ondansetron, oxyCODONE, sodium chloride, DISCONTD: ondansetron (ZOFRAN) IV   Assessment: Active Problems:  Morbid obesity  HIDRADENITIS SUPPURATIVA  Systolic congestive heart failure  SOB (shortness of breath)  DM type 2 (diabetes mellitus, type 2)  Iron deficiency anemia  Hyperkalemia  Acute on chronic systolic heart failure   Plan: #1 anemia no obvious blood loss, no source of bleeding, likely anemia of chronic disease. We'll check an anemia panel, PT/INR, transfuse another 2 units.  For hemoglobin greater than 8 which is her baseline. She will need an outpatient workup including a screening colonoscopy at some point.   #2 CK D. stage III seen by nephrology yesterday, baseline creatinine is around 2.5 .We will continue with IV Lasix for her CHF exacerbation, we will have to monitor her kidney function closely, hold off on an ACE inhibitor for the same reason   #3 acute on chronic systolic heart  failure last 2-D echo was obtained in January 2011 that showed an EF of 35-40% of  Continue IV Lasix  #4 hyperkalemia treatment with Kayexalate    LOS: 1 day   [facility] 2020/10/1712, 11:35 AM

## 2012-04-05 NOTE — Plan of Care (Signed)
Problem: Phase I Progression Outcomes Goal: EF % per last Echo/documented,Core Reminder form on chart 35-40% per echo in jan 2011

## 2012-04-06 DIAGNOSIS — I5021 Acute systolic (congestive) heart failure: Secondary | ICD-10-CM

## 2012-04-06 DIAGNOSIS — D649 Anemia, unspecified: Secondary | ICD-10-CM

## 2012-04-06 DIAGNOSIS — L732 Hidradenitis suppurativa: Secondary | ICD-10-CM

## 2012-04-06 LAB — URINE CULTURE: Culture  Setup Time: 201305100216

## 2012-04-06 LAB — CBC
HCT: 33.4 % — ABNORMAL LOW (ref 36.0–46.0)
HCT: 32.6 % — ABNORMAL LOW (ref 36.0–46.0)
Hemoglobin: 9.9 g/dL — ABNORMAL LOW (ref 12.0–15.0)
Hemoglobin: 9.8 g/dL — ABNORMAL LOW (ref 12.0–15.0)
MCH: 23.8 pg — ABNORMAL LOW (ref 26.0–34.0)
MCHC: 29.6 g/dL — ABNORMAL LOW (ref 30.0–36.0)
MCHC: 30.1 g/dL (ref 30.0–36.0)
MCV: 79.3 fL (ref 78.0–100.0)
RDW: 21.3 % — ABNORMAL HIGH (ref 11.5–15.5)
WBC: 17.7 10*3/uL — ABNORMAL HIGH (ref 4.0–10.5)

## 2012-04-06 LAB — TYPE AND SCREEN: Unit division: 0

## 2012-04-06 LAB — BASIC METABOLIC PANEL
BUN: 39 mg/dL — ABNORMAL HIGH (ref 6–23)
Calcium: 8.3 mg/dL — ABNORMAL LOW (ref 8.4–10.5)
Creatinine, Ser: 2.4 mg/dL — ABNORMAL HIGH (ref 0.50–1.10)
GFR calc Af Amer: 25 mL/min — ABNORMAL LOW (ref >90)
GFR calc Af Amer: 26 mL/min — ABNORMAL LOW (ref >90)
GFR calc non Af Amer: 22 mL/min — ABNORMAL LOW (ref >90)
GFR calc non Af Amer: 22 mL/min — ABNORMAL LOW (ref >90)
Glucose, Bld: 117 mg/dL — ABNORMAL HIGH (ref 70–99)
Potassium: 4.8 mEq/L (ref 3.5–5.1)
Sodium: 136 mEq/L (ref 135–145)

## 2012-04-06 LAB — CARDIAC PANEL(CRET KIN+CKTOT+MB+TROPI)
CK, MB: 2.1 ng/mL (ref 0.3–4.0)
Relative Index: INVALID (ref 0.0–2.5)
Troponin I: <0.3 ng/mL (ref <0.30)

## 2012-04-06 LAB — PRO B NATRIURETIC PEPTIDE: Pro B Natriuretic peptide (BNP): 12074 pg/mL — ABNORMAL HIGH (ref 0–125)

## 2012-04-06 MED ORDER — OXYCODONE HCL 5 MG PO TABS: 5.0000 mg | ORAL_TABLET | ORAL | Status: AC | PRN

## 2012-04-06 MED ORDER — ENOXAPARIN SODIUM 60 MG/0.6ML ~~LOC~~ SOLN
60.0000 mg | SUBCUTANEOUS | Status: DC
  Filled 2012-04-06: qty 0.6

## 2012-04-06 MED ORDER — ENOXAPARIN SODIUM 40 MG/0.4ML ~~LOC~~ SOLN
40.0000 mg | SUBCUTANEOUS | Status: DC
  Administered 2012-04-06: 40 mg via SUBCUTANEOUS
  Filled 2012-04-06 (×2): qty 0.4

## 2012-04-06 MED ORDER — FLUCONAZOLE 100 MG PO TABS: 100.0000 mg | ORAL_TABLET | Freq: Every day | ORAL | Status: AC

## 2012-04-06 MED ORDER — SODIUM POLYSTYRENE SULFONATE 15 GM/60ML PO SUSP: ORAL | Status: DC

## 2012-04-06 MED ORDER — PANTOPRAZOLE SODIUM 40 MG PO TBEC: 40.0000 mg | DELAYED_RELEASE_TABLET | Freq: Every day | ORAL | Status: DC

## 2012-04-06 NOTE — Progress Notes (Signed)
Subjective: Feels well With chest pain or shortness of breath  Objective: Vital signs in last 24 hours: Filed Vitals:   04/05/12 1720 04/05/12 1820 04/05/12 1934 04/06/12 0706  BP: 145/86 142/82 150/79 158/86  Pulse: 74 79 78 76  Temp: 98.6 F (37 C) 99.6 F (37.6 C) 100 F (37.8 C) 98 F (36.7 C)  TempSrc: Oral Oral Oral Oral  Resp: 18 18 20 16   Height:      Weight:      SpO2:   94% 96%    Intake/Output Summary (Last 24 hours) at 04/06/12 1138 Last data filed at 04/06/12 0706  Gross per 24 hour  Intake    385 ml  Output   5800 ml  Net  -5415 ml    Weight change:   General: Alert and awake oriented x3 not in any acute distress.  HEENT: anicteric sclera, pupils reactive to light and accommodation  CVS: S1-S2 clear no murmur rubs or gallops  Chest: difficult to auscultate breath sounds due to body habitus however no wheezing or rhonchi bilaterally  Abdomen: morbid obesity soft nontender, nondistended, normal bowel sounds, no organomegaly , foul-smelling orders discharge in the groin area  Extremities: no cyanosis, clubbing or edema noted bilaterally  Neuro: Cranial nerves II-XII intact, no focal neurological deficits      Lab Results: Results for orders placed during the hospital encounter of 04/04/12 (from the past 24 hour(s))  PRO B NATRIURETIC PEPTIDE     Status: Abnormal   Collection Time   04/06/12 12:35 AM      Component Value Range   Pro B Natriuretic peptide (BNP) 12074.0 (*) 0 - 125 (pg/mL)  CBC     Status: Abnormal   Collection Time   04/06/12 12:35 AM      Component Value Range   WBC 17.7 (*) 4.0 - 10.5 (K/uL)   RBC 4.25  3.87 - 5.11 (MIL/uL)   Hemoglobin 9.9 (*) 12.0 - 15.0 (g/dL)   HCT 33.4 (*) 36.0 - 46.0 (%)   MCV 78.6  78.0 - 100.0 (fL)   MCH 23.3 (*) 26.0 - 34.0 (pg)   MCHC 29.6 (*) 30.0 - 36.0 (g/dL)   RDW 21.3 (*) 11.5 - 15.5 (%)   Platelets 409 (*) 150 - 400 (K/uL)  CARDIAC PANEL(CRET KIN+CKTOT+MB+TROPI)     Status: Normal   Collection  Time   04/06/12 12:35 AM      Component Value Range   Total CK 45  7 - 177 (U/L)   CK, MB 2.1  0.3 - 4.0 (ng/mL)   Troponin I <0.30  <0.30 (ng/mL)   Relative Index RELATIVE INDEX IS INVALID  0.0 - 2.5   BASIC METABOLIC PANEL     Status: Abnormal   Collection Time   04/06/12 12:35 AM      Component Value Range   Sodium 136  135 - 145 (mEq/L)   Potassium 4.8  3.5 - 5.1 (mEq/L)   Chloride 105  96 - 112 (mEq/L)   CO2 19  19 - 32 (mEq/L)   Glucose, Bld 126 (*) 70 - 99 (mg/dL)   BUN 40 (*) 6 - 23 (mg/dL)   Creatinine, Ser 2.47 (*) 0.50 - 1.10 (mg/dL)   Calcium 8.5  8.4 - 10.5 (mg/dL)   GFR calc non Af Amer 22 (*) >90 (mL/min)   GFR calc Af Amer 25 (*) >90 (mL/min)  CBC     Status: Abnormal   Collection Time   04/06/12  5:00  AM      Component Value Range   WBC 17.9 (*) 4.0 - 10.5 (K/uL)   RBC 4.11  3.87 - 5.11 (MIL/uL)   Hemoglobin 9.8 (*) 12.0 - 15.0 (g/dL)   HCT 32.6 (*) 36.0 - 46.0 (%)   MCV 79.3  78.0 - 100.0 (fL)   MCH 23.8 (*) 26.0 - 34.0 (pg)   MCHC 30.1  30.0 - 36.0 (g/dL)   RDW 21.3 (*) 11.5 - 15.5 (%)   Platelets 380  150 - 400 (K/uL)  BASIC METABOLIC PANEL     Status: Abnormal   Collection Time   04/06/12  5:00 AM      Component Value Range   Sodium 136  135 - 145 (mEq/L)   Potassium 4.7  3.5 - 5.1 (mEq/L)   Chloride 106  96 - 112 (mEq/L)   CO2 18 (*) 19 - 32 (mEq/L)   Glucose, Bld 117 (*) 70 - 99 (mg/dL)   BUN 39 (*) 6 - 23 (mg/dL)   Creatinine, Ser 2.40 (*) 0.50 - 1.10 (mg/dL)   Calcium 8.3 (*) 8.4 - 10.5 (mg/dL)   GFR calc non Af Amer 22 (*) >90 (mL/min)   GFR calc Af Amer 26 (*) >90 (mL/min)     Micro: Recent Results (from the past 240 hour(s))  URINE CULTURE     Status: Normal   Collection Time   04/04/12  2:50 PM      Component Value Range Status Comment   Specimen Description URINE, CATHETERIZED   Final    Special Requests NONE   Final    Culture  Setup Time [id number]   Final    Colony Count NO GROWTH   Final    Culture NO GROWTH   Final    Report  Status 04/06/2012 FINAL   Final     Studies/Results: Dg Chest 2 View  04/04/2012  *RADIOLOGY REPORT*  Clinical Data: Shortness of breath, hypertension, CHF, diabetes  CHEST - 2 VIEW  Comparison: 01/19/2012  Findings: Enlargement of cardiac silhouette with pulmonary vascular congestion. No definite infiltrate or pleural effusion. No pneumothorax. Osseous structures unremarkable.  IMPRESSION: Enlargement of cardiac silhouette with pulmonary vascular congestion. No acute abnormalities.  Original Report Authenticated By: Burnetta Sabin, M.D.    Medications:  Scheduled Meds:   . aspirin EC  81 mg Oral Daily  . carvedilol  6.25 mg Oral BID WC  . docusate sodium  100 mg Oral BID  . DULoxetine  60 mg Oral Daily  . enoxaparin (LOVENOX) injection  40 mg Subcutaneous Q24H  . feeding supplement  30 mL Oral BID AC  . ferrous sulfate  325 mg Oral BID  . fluconazole (DIFLUCAN) IV  100 mg Intravenous Q24H  . furosemide  60 mg Intravenous BID  . levothyroxine  25 mcg Oral Daily  . senna  1 tablet Oral BID  . simvastatin  5 mg Oral q1800  . sodium chloride  3 mL Intravenous Q12H  . sodium chloride  3 mL Intravenous Q12H  . sodium polystyrene  30 g Oral Once  . DISCONTD: ferrous sulfate  325 mg Oral Q breakfast  . DISCONTD: ipratropium  0.5 mg Nebulization TID   Continuous Infusions:  PRN Meds:.sodium chloride, acetaminophen, acetaminophen, albuterol, ondansetron (ZOFRAN) IV, ondansetron, oxyCODONE, sodium chloride, DISCONTD: albuterol   Assessment: Active Problems:  Morbid obesity  HIDRADENITIS SUPPURATIVA  Systolic congestive heart failure  SOB (shortness of breath)  DM type 2 (diabetes mellitus, type 2)  Iron deficiency  anemia  Hyperkalemia  Acute on chronic systolic heart failure   Plan:  #1 anemia no obvious blood loss, no source of bleeding, likely anemia of chronic disease. Anemia panel consistent with anemia of chronic disease, PT/INR, transfuse to a total of 3 units of packed red  blood cells. For hemoglobin 9.8 which is her baseline. She will need an outpatient workup including a screening colonoscopy at some point.   #2 CK D. stage III seen by nephrology yesterday, baseline creatinine is around 2.5 .We will continue with IV Lasix for her CHF exacerbation, we will have to monitor her kidney function closely, hold off on an ACE inhibitor for the same reason   #3 acute on chronic systolic heart failure last 2-D echo was obtained in January 2011 that showed an EF of 35-40% of  Continue IV Lasix ,  #4 hyperkalemia should, she is prone to hyperkalemia and will need periodic treatment with oral Kayexalate and serial BMP at least once a month     LOS: 2 days   [facility] 04/06/2012, 11:38 AM

## 2012-04-06 NOTE — Progress Notes (Signed)
*  PRELIMINARY RESULTS* Echocardiogram 2D Echocardiogram has been performed.  [name] 04/06/2012, 10:04 AM

## 2012-04-07 DIAGNOSIS — D649 Anemia, unspecified: Secondary | ICD-10-CM

## 2012-04-07 DIAGNOSIS — I5021 Acute systolic (congestive) heart failure: Secondary | ICD-10-CM

## 2012-04-07 DIAGNOSIS — L732 Hidradenitis suppurativa: Secondary | ICD-10-CM

## 2012-04-07 LAB — GLUCOSE, CAPILLARY: Glucose-Capillary: 188 mg/dL — ABNORMAL HIGH (ref 70–99)

## 2012-04-07 LAB — BASIC METABOLIC PANEL
Chloride: 104 mEq/L (ref 96–112)
GFR calc Af Amer: 26 mL/min — ABNORMAL LOW (ref >90)
Potassium: 4.4 mEq/L (ref 3.5–5.1)
Sodium: 136 mEq/L (ref 135–145)

## 2012-04-07 NOTE — Progress Notes (Signed)
Heart Failure Core Measures Documentation  ECHO with EF of 35% - 40% NO ACE-I or ARB on board due to renal insufficiency  Vanessa , PharmD.   Pager:  [phone] 1:01 PM

## 2012-04-07 NOTE — Discharge Summary (Signed)
Physician Discharge Summary  Kateleen Wilds MRN: [id number] DOB/AGE: 08/03/1961 51 y.o.  PCP: [name], MD, MD   Admit date: 04/04/2012 Discharge date: 04/07/2012  Discharge Diagnoses:     Morbid obesity  HIDRADENITIS SUPPURATIVA  Systolic congestive heart failure EF of 35-40%  SOB (shortness of breath)  DM type 2 (diabetes mellitus, type 2)  Iron deficiency anemia  Hyperkalemia  Acute on chronic systolic heart failure   Medication List  As of 04/07/2012  9:09 AM   TAKE these medications         ascorbic acid 500 MG tablet   Commonly known as: VITAMIN C   Take 500 mg by mouth 2 (two) times daily.      aspirin 81 MG EC tablet   Take 1 tablet (81 mg total) by mouth daily.      carvedilol 3.125 MG tablet   Commonly known as: COREG   Take 6.25 mg by mouth 2 (two) times daily with a meal. Take one tab by mouth with food twice a day      CYMBALTA 60 MG capsule   Generic drug: DULoxetine   Take 60 mg by mouth daily.      ferrous sulfate 325 (65 FE) MG tablet   Take 325 mg by mouth 2 (two) times daily.      fluconazole 100 MG tablet   Commonly known as: DIFLUCAN   Take 1 tablet (100 mg total) by mouth daily.      folic acid 1 MG tablet   Commonly known as: FOLVITE   Take 1 mg by mouth daily.      furosemide 20 MG tablet   Commonly known as: LASIX   Take 60 mg by mouth 2 (two) times daily.      insulin lispro protamine-insulin lispro (75-25) 100 UNIT/ML Susp   Commonly known as: HUMALOG 75/25   Give 35 units subq every morning and 20 units sub q every PM      levothyroxine 25 MCG tablet   Commonly known as: SYNTHROID, LEVOTHROID   Take 25 mcg by mouth daily.      magnesium oxide 400 MG tablet   Commonly known as: MAG-OX   Take 400 mg by mouth 2 (two) times daily.      oxyCODONE 5 MG immediate release tablet   Commonly known as: Oxy IR/ROXICODONE   Take 1 tablet (5 mg total) by mouth every 4 (four) hours as needed.      oxyCODONE-acetaminophen  5-325 MG per tablet   Commonly known as: PERCOCET   Take 1 tablet by mouth every 6 (six) hours as needed. For pain      pantoprazole 40 MG tablet   Commonly known as: PROTONIX   Take 1 tablet (40 mg total) by mouth daily.      pravastatin 40 MG tablet   Commonly known as: PRAVACHOL   Take 40 mg by mouth daily.      sodium polystyrene 15 GM/60ML suspension   Commonly known as: KAYEXALATE   15 gm PO every other day      zinc sulfate 220 MG capsule   Take 220 mg by mouth daily.            Discharge Condition: Stable  Disposition: 01-Home or Self Care   Consults: None  Significant Diagnostic Studies: Dg Chest 2 View  04/04/2012  *RADIOLOGY REPORT*  Clinical Data: Shortness of breath, hypertension, CHF, diabetes  CHEST - 2 VIEW  Comparison: 01/19/2012  Findings: Enlargement of cardiac silhouette with pulmonary vascular congestion. No definite infiltrate or pleural effusion. No pneumothorax. Osseous structures unremarkable.  IMPRESSION: Enlargement of cardiac silhouette with pulmonary vascular congestion. No acute abnormalities.  Original Report Authenticated By: Burnetta Sabin, M.D.   2-D echo Left ventricle: The images of the LV are very difficult and contrast was not used. The EF appears to be moderately depressed but the endocardium was not seen well. Suggest repeat echo with contrast or other imaging modality. Wall thickness was increased in a pattern of mild LVH. Systolic function was moderately reduced. The estimated ejection fraction was in the range of 35% to 40%.     Microbiology: Recent Results (from the past 240 hour(s))  URINE CULTURE     Status: Normal   Collection Time   04/04/12  2:50 PM      Component Value Range Status Comment   Specimen Description URINE, CATHETERIZED   Final    Special Requests NONE   Final    Culture  Setup Time [id number]   Final    Colony Count NO GROWTH   Final    Culture NO GROWTH   Final    Report Status 04/06/2012 FINAL    Final      Labs: Results for orders placed during the hospital encounter of 04/04/12 (from the past 48 hour(s))  PREPARE RBC (CROSSMATCH)     Status: Normal   Collection Time   04/05/12  9:30 AM      Component Value Range Comment   Order Confirmation ORDER PROCESSED BY BLOOD BANK     PRO B NATRIURETIC PEPTIDE     Status: Abnormal   Collection Time   04/06/12 12:35 AM      Component Value Range Comment   Pro B Natriuretic peptide (BNP) 12074.0 (*) 0 - 125 (pg/mL)   CBC     Status: Abnormal   Collection Time   04/06/12 12:35 AM      Component Value Range Comment   WBC 17.7 (*) 4.0 - 10.5 (K/uL)    RBC 4.25  3.87 - 5.11 (MIL/uL)    Hemoglobin 9.9 (*) 12.0 - 15.0 (g/dL)    HCT 33.4 (*) 36.0 - 46.0 (%)    MCV 78.6  78.0 - 100.0 (fL)    MCH 23.3 (*) 26.0 - 34.0 (pg)    MCHC 29.6 (*) 30.0 - 36.0 (g/dL)    RDW 21.3 (*) 11.5 - 15.5 (%)    Platelets 409 (*) 150 - 400 (K/uL)   CARDIAC PANEL(CRET KIN+CKTOT+MB+TROPI)     Status: Normal   Collection Time   04/06/12 12:35 AM      Component Value Range Comment   Total CK 45  7 - 177 (U/L)    CK, MB 2.1  0.3 - 4.0 (ng/mL)    Troponin I <0.30  <0.30 (ng/mL)    Relative Index RELATIVE INDEX IS INVALID  0.0 - 2.5    BASIC METABOLIC PANEL     Status: Abnormal   Collection Time   04/06/12 12:35 AM      Component Value Range Comment   Sodium 136  135 - 145 (mEq/L)    Potassium 4.8  3.5 - 5.1 (mEq/L)    Chloride 105  96 - 112 (mEq/L)    CO2 19  19 - 32 (mEq/L)    Glucose, Bld 126 (*) 70 - 99 (mg/dL)    BUN 40 (*) 6 - 23 (mg/dL)    Creatinine, Ser 2.47 (*)  0.50 - 1.10 (mg/dL)    Calcium 8.5  8.4 - 10.5 (mg/dL)    GFR calc non Af Amer 22 (*) >90 (mL/min)    GFR calc Af Amer 25 (*) >90 (mL/min)   CBC     Status: Abnormal   Collection Time   04/06/12  5:00 AM      Component Value Range Comment   WBC 17.9 (*) 4.0 - 10.5 (K/uL)    RBC 4.11  3.87 - 5.11 (MIL/uL)    Hemoglobin 9.8 (*) 12.0 - 15.0 (g/dL)    HCT 32.6 (*) 36.0 - 46.0 (%)    MCV 79.3   78.0 - 100.0 (fL)    MCH 23.8 (*) 26.0 - 34.0 (pg)    MCHC 30.1  30.0 - 36.0 (g/dL)    RDW 21.3 (*) 11.5 - 15.5 (%)    Platelets 380  150 - 400 (K/uL)   BASIC METABOLIC PANEL     Status: Abnormal   Collection Time   04/06/12  5:00 AM      Component Value Range Comment   Sodium 136  135 - 145 (mEq/L)    Potassium 4.7  3.5 - 5.1 (mEq/L)    Chloride 106  96 - 112 (mEq/L)    CO2 18 (*) 19 - 32 (mEq/L)    Glucose, Bld 117 (*) 70 - 99 (mg/dL)    BUN 39 (*) 6 - 23 (mg/dL)    Creatinine, Ser 2.40 (*) 0.50 - 1.10 (mg/dL)    Calcium 8.3 (*) 8.4 - 10.5 (mg/dL)    GFR calc non Af Amer 22 (*) >90 (mL/min)    GFR calc Af Amer 26 (*) >90 (mL/min)   BASIC METABOLIC PANEL     Status: Abnormal   Collection Time   04/07/12  4:45 AM      Component Value Range Comment   Sodium 136  135 - 145 (mEq/L)    Potassium 4.4  3.5 - 5.1 (mEq/L)    Chloride 104  96 - 112 (mEq/L)    CO2 20  19 - 32 (mEq/L)    Glucose, Bld 110 (*) 70 - 99 (mg/dL)    BUN 40 (*) 6 - 23 (mg/dL)    Creatinine, Ser 2.43 (*) 0.50 - 1.10 (mg/dL)    Calcium 8.3 (*) 8.4 - 10.5 (mg/dL)    GFR calc non Af Amer 22 (*) >90 (mL/min)    GFR calc Af Amer 26 (*) >90 (mL/min)      HPI 51 year old female, with morbid obesity, history of systolic heart failure , CK D. stage III presents with chief complaint of shortness of breath. She was seen at [facility] and was found to have a hemoglobin of 6.7. She was instructed to come to the ER for a transfusion. She has received previous transfusions because of her anemia of chronic disease. She states that she was as blood around her perirectal area because of her hidradenitis. She has not noticed any frank melena or hematochezia. She denied any hematemesis. She has not had an EGD or a colonoscopy in the past. She denied any chest pain but did  complain of dyspnea on exertion, orthopnea, dependent edema, more so in the last couple of weeks .  Her creatinine has been increasing over the  course of the last several months. She was followed by Dr. Drucilla Schmidt at the infectious disease clinic up until March of this year, when the patient was receiving IV vancomycin for her hidradenitis. She also  received levofloxacin following discontinuation of her vancomycin. Because of her increasing creatinine Levaquin was also discontinued. She has been off all antibiotics for the last 2 months  HOSPITAL COURSE:  #1 shortness of breath thought to be secondary to acute systolic heart failure the setting of anemia, as well as severe pulmonary hypertension with a PA pressure of 63. Chest x-ray showed pulmonary vascular congestion. Patient was diuresed with IV Lasix with good urine output and good response without increase in her creatinine. She has had symptomatic improvement. No previous history of sleep apnea however I feel that she should have an outpatient sleep study given her morbid obesity.  #2 acute kidney injury in the setting of stage III CK D. creatinine remained stable at around 2.4 despite diuresis. Holding ACE inhibitor at this point given acute on chronic renal insufficiency. However she would need to be on long-term ACE inhibitor because of the fact that she is a diabetic.  #3 type 2 diabetes , last hemoglobin A1c was 8.5. On Humulin 70/30 at home  #4 hidradenitis  care consultation was done, this time the patient has chronic leukocytosis without any obvious signs of infection she does have an order is foul-smelling discharge. She has completed vancomycin and Levaquin in March of this year. Antibiotics were discontinued because of increasing creatinine. She has been referred to [facility] for [facility]. Because of her for foul-smelling discharge and questionable yeast infection the patient will continue with consult for another 7 days  Discharge Exam:  Blood pressure 144/81, pulse 74, temperature 98.1 F (36.7 C), temperature source Oral, resp. rate 20, height 5\' 6"  (1.676 m), weight 172.3 kg (379  lb 13.6 oz), SpO2 97.00%.  General: Alert and awake oriented x3 not in any acute distress.  HEENT: anicteric sclera, pupils reactive to light and accommodation  CVS: S1-S2 clear no murmur rubs or gallops  Chest: difficult to auscultate breath sounds due to body habitus however no wheezing or rhonchi bilaterally  Abdomen: morbid obesity soft nontender, nondistended, normal bowel sounds, no organomegaly , foul-smelling orders discharge in the groin area  Extremities: no cyanosis, clubbing or edema noted bilaterally  Neuro: Cranial nerves II-XII intact, no focal neurological deficits       Discharge Orders    Future Orders Please Complete By Expires   Diet - low sodium heart healthy      Increase activity slowly      Call MD for:  temperature >100.4      Call MD for:  persistant nausea and vomiting      Call MD for:  difficulty breathing, headache or visual disturbances         Follow-up Information    Follow up with [name], MD. (BMP in one week, CBC in one week)    Contact information:   [place] [phone]          Signed: Reyne Dumas 04/07/2012, 9:09 AM

## 2012-04-07 NOTE — Discharge Instructions (Signed)
Sleep study monitor cbg tid

## 2012-04-08 NOTE — ED Provider Notes (Signed)
Medical screening examination/treatment/procedure(s) were performed by non-physician practitioner and as supervising physician I was immediately available for consultation/collaboration.  Virgel Manifold, MD 04/08/12 [phone]

## 2012-05-21 ENCOUNTER — Emergency Department (HOSPITAL_COMMUNITY): Payer: BC Managed Care – PPO

## 2012-05-21 ENCOUNTER — Inpatient Hospital Stay (HOSPITAL_COMMUNITY)
Admission: EM | Admit: 2012-05-21 | Discharge: 2012-06-04 | DRG: 568 | Disposition: A | Discharge status: Home / Self Care | Payer: BC Managed Care – PPO | Attending: Internal Medicine | Admitting: Internal Medicine

## 2012-05-21 ENCOUNTER — Inpatient Hospital Stay (HOSPITAL_COMMUNITY): Payer: BC Managed Care – PPO

## 2012-05-21 ENCOUNTER — Encounter (HOSPITAL_COMMUNITY): Payer: Self-pay | Admitting: *Deleted

## 2012-05-21 DIAGNOSIS — E872 Acidosis, unspecified: Secondary | ICD-10-CM | POA: Diagnosis present

## 2012-05-21 DIAGNOSIS — L88 Pyoderma gangrenosum: Secondary | ICD-10-CM

## 2012-05-21 DIAGNOSIS — N201 Calculus of ureter: Secondary | ICD-10-CM | POA: Diagnosis present

## 2012-05-21 DIAGNOSIS — I5023 Acute on chronic systolic (congestive) heart failure: Secondary | ICD-10-CM

## 2012-05-21 DIAGNOSIS — N179 Acute kidney failure, unspecified: Principal | ICD-10-CM | POA: Diagnosis present

## 2012-05-21 DIAGNOSIS — K219 Gastro-esophageal reflux disease without esophagitis: Secondary | ICD-10-CM

## 2012-05-21 DIAGNOSIS — Z7982 Long term (current) use of aspirin: Secondary | ICD-10-CM

## 2012-05-21 DIAGNOSIS — I428 Other cardiomyopathies: Secondary | ICD-10-CM | POA: Diagnosis present

## 2012-05-21 DIAGNOSIS — E1169 Type 2 diabetes mellitus with other specified complication: Secondary | ICD-10-CM

## 2012-05-21 DIAGNOSIS — I5020 Unspecified systolic (congestive) heart failure: Secondary | ICD-10-CM

## 2012-05-21 DIAGNOSIS — K5900 Constipation, unspecified: Secondary | ICD-10-CM | POA: Diagnosis present

## 2012-05-21 DIAGNOSIS — B965 Pseudomonas (aeruginosa) (mallei) (pseudomallei) as the cause of diseases classified elsewhere: Secondary | ICD-10-CM

## 2012-05-21 DIAGNOSIS — N184 Chronic kidney disease, stage 4 (severe): Secondary | ICD-10-CM | POA: Diagnosis present

## 2012-05-21 DIAGNOSIS — Z872 Personal history of diseases of the skin and subcutaneous tissue: Secondary | ICD-10-CM

## 2012-05-21 DIAGNOSIS — D638 Anemia in other chronic diseases classified elsewhere: Secondary | ICD-10-CM | POA: Diagnosis present

## 2012-05-21 DIAGNOSIS — E669 Obesity, unspecified: Secondary | ICD-10-CM | POA: Diagnosis present

## 2012-05-21 DIAGNOSIS — Z9119 Patient's noncompliance with other medical treatment and regimen: Secondary | ICD-10-CM

## 2012-05-21 DIAGNOSIS — I129 Hypertensive chronic kidney disease with stage 1 through stage 4 chronic kidney disease, or unspecified chronic kidney disease: Secondary | ICD-10-CM | POA: Diagnosis present

## 2012-05-21 DIAGNOSIS — Z91199 Patient's noncompliance with other medical treatment and regimen due to unspecified reason: Secondary | ICD-10-CM

## 2012-05-21 DIAGNOSIS — N200 Calculus of kidney: Secondary | ICD-10-CM

## 2012-05-21 DIAGNOSIS — Z79899 Other long term (current) drug therapy: Secondary | ICD-10-CM

## 2012-05-21 DIAGNOSIS — L732 Hidradenitis suppurativa: Secondary | ICD-10-CM

## 2012-05-21 DIAGNOSIS — E875 Hyperkalemia: Secondary | ICD-10-CM | POA: Diagnosis present

## 2012-05-21 DIAGNOSIS — Z6841 Body Mass Index (BMI) 40.0 and over, adult: Secondary | ICD-10-CM

## 2012-05-21 DIAGNOSIS — M109 Gout, unspecified: Secondary | ICD-10-CM | POA: Diagnosis present

## 2012-05-21 DIAGNOSIS — E8809 Other disorders of plasma-protein metabolism, not elsewhere classified: Secondary | ICD-10-CM | POA: Diagnosis not present

## 2012-05-21 DIAGNOSIS — D649 Anemia, unspecified: Secondary | ICD-10-CM | POA: Diagnosis present

## 2012-05-21 DIAGNOSIS — F0630 Mood disorder due to known physiological condition, unspecified: Secondary | ICD-10-CM

## 2012-05-21 DIAGNOSIS — I509 Heart failure, unspecified: Secondary | ICD-10-CM | POA: Diagnosis present

## 2012-05-21 DIAGNOSIS — N1330 Unspecified hydronephrosis: Secondary | ICD-10-CM

## 2012-05-21 DIAGNOSIS — E039 Hypothyroidism, unspecified: Secondary | ICD-10-CM | POA: Diagnosis present

## 2012-05-21 DIAGNOSIS — R112 Nausea with vomiting, unspecified: Secondary | ICD-10-CM

## 2012-05-21 DIAGNOSIS — E119 Type 2 diabetes mellitus without complications: Secondary | ICD-10-CM | POA: Diagnosis present

## 2012-05-21 DIAGNOSIS — N289 Disorder of kidney and ureter, unspecified: Secondary | ICD-10-CM

## 2012-05-21 DIAGNOSIS — Z794 Long term (current) use of insulin: Secondary | ICD-10-CM

## 2012-05-21 DIAGNOSIS — D509 Iron deficiency anemia, unspecified: Secondary | ICD-10-CM

## 2012-05-21 DIAGNOSIS — E042 Nontoxic multinodular goiter: Secondary | ICD-10-CM

## 2012-05-21 LAB — URINALYSIS, ROUTINE W REFLEX MICROSCOPIC
Glucose, UA: NEGATIVE mg/dL
Ketones, ur: NEGATIVE mg/dL
Nitrite: NEGATIVE
Protein, ur: 30 mg/dL — AB
pH: 5.5 (ref 5.0–8.0)

## 2012-05-21 LAB — CBC
MCH: 24.1 pg — ABNORMAL LOW (ref 26.0–34.0)
MCHC: 29.8 g/dL — ABNORMAL LOW (ref 30.0–36.0)
MCV: 81 fL (ref 78.0–100.0)
Platelets: 299 10*3/uL (ref 150–400)
RBC: 2.94 MIL/uL — ABNORMAL LOW (ref 3.87–5.11)
RDW: 21.3 % — ABNORMAL HIGH (ref 11.5–15.5)

## 2012-05-21 LAB — DIFFERENTIAL
Basophils Absolute: 0 10*3/uL (ref 0.0–0.1)
Basophils Relative: 0 % (ref 0–1)
Eosinophils Absolute: 0.8 10*3/uL — ABNORMAL HIGH (ref 0.0–0.7)
Eosinophils Relative: 4 % (ref 0–5)
Neutrophils Relative %: 87 % — ABNORMAL HIGH (ref 43–77)

## 2012-05-21 LAB — URINE MICROSCOPIC-ADD ON

## 2012-05-21 LAB — PREGNANCY, URINE: Preg Test, Ur: NEGATIVE

## 2012-05-21 LAB — COMPREHENSIVE METABOLIC PANEL
AST: 5 U/L (ref 0–37)
Albumin: 1.9 g/dL — ABNORMAL LOW (ref 3.5–5.2)
BUN: 66 mg/dL — ABNORMAL HIGH (ref 6–23)
Calcium: 8.3 mg/dL — ABNORMAL LOW (ref 8.4–10.5)
Creatinine, Ser: 4.06 mg/dL — ABNORMAL HIGH (ref 0.50–1.10)
Total Bilirubin: 0.2 mg/dL — ABNORMAL LOW (ref 0.3–1.2)

## 2012-05-21 LAB — GLUCOSE, CAPILLARY: Glucose-Capillary: 132 mg/dL — ABNORMAL HIGH (ref 70–99)

## 2012-05-21 LAB — PREPARE RBC (CROSSMATCH)

## 2012-05-21 MED ORDER — INSULIN GLARGINE 100 UNIT/ML ~~LOC~~ SOLN
35.0000 [IU] | Freq: Every day | SUBCUTANEOUS | Status: DC
  Administered 2012-05-21 – 2012-05-27 (×7): 35 [IU] via SUBCUTANEOUS

## 2012-05-21 MED ORDER — ISOSORBIDE MONONITRATE ER 30 MG PO TB24
30.0000 mg | ORAL_TABLET | Freq: Every day | ORAL | Status: DC
  Administered 2012-05-22 – 2012-06-04 (×14): 30 mg via ORAL
  Filled 2012-05-21 (×15): qty 1

## 2012-05-21 MED ORDER — CARVEDILOL 6.25 MG PO TABS
6.2500 mg | ORAL_TABLET | Freq: Two times a day (BID) | ORAL | Status: DC
  Administered 2012-05-22 – 2012-06-04 (×27): 6.25 mg via ORAL
  Filled 2012-05-21 (×34): qty 1

## 2012-05-21 MED ORDER — ONDANSETRON HCL 4 MG/2ML IJ SOLN
4.0000 mg | Freq: Once | INTRAMUSCULAR | Status: AC
  Administered 2012-05-21: 4 mg via INTRAVENOUS
  Filled 2012-05-21: qty 2

## 2012-05-21 MED ORDER — LEVOFLOXACIN IN D5W 750 MG/150ML IV SOLN: 750.0000 mg | INTRAVENOUS | Status: DC

## 2012-05-21 MED ORDER — ENOXAPARIN SODIUM 40 MG/0.4ML ~~LOC~~ SOLN
40.0000 mg | SUBCUTANEOUS | Status: DC
  Administered 2012-05-21: 40 mg via SUBCUTANEOUS
  Filled 2012-05-21 (×2): qty 0.4

## 2012-05-21 MED ORDER — ENOXAPARIN SODIUM 30 MG/0.3ML ~~LOC~~ SOLN: 30.0000 mg | SUBCUTANEOUS | Status: DC

## 2012-05-21 MED ORDER — SIMVASTATIN 20 MG PO TABS
20.0000 mg | ORAL_TABLET | Freq: Every day | ORAL | Status: DC
  Administered 2012-05-22 – 2012-06-03 (×13): 20 mg via ORAL
  Filled 2012-05-21 (×16): qty 1

## 2012-05-21 MED ORDER — FUROSEMIDE 10 MG/ML IJ SOLN
20.0000 mg | Freq: Once | INTRAMUSCULAR | Status: AC
  Administered 2012-05-21: 20 mg via INTRAVENOUS
  Filled 2012-05-21: qty 4

## 2012-05-21 MED ORDER — OXYCODONE-ACETAMINOPHEN 5-325 MG PO TABS
1.0000 | ORAL_TABLET | Freq: Four times a day (QID) | ORAL | Status: DC | PRN
  Administered 2012-05-21 – 2012-06-04 (×22): 1 via ORAL
  Filled 2012-05-21 (×22): qty 1

## 2012-05-21 MED ORDER — VANCOMYCIN HCL IN DEXTROSE 1-5 GM/200ML-% IV SOLN
1000.0000 mg | INTRAVENOUS | Status: DC
  Administered 2012-05-21: 1000 mg via INTRAVENOUS
  Filled 2012-05-21: qty 200

## 2012-05-21 MED ORDER — CLINDAMYCIN PHOSPHATE 600 MG/50ML IV SOLN
600.0000 mg | Freq: Four times a day (QID) | INTRAVENOUS | Status: DC
  Administered 2012-05-22 (×2): 600 mg via INTRAVENOUS
  Filled 2012-05-21 (×5): qty 50

## 2012-05-21 MED ORDER — HYDROMORPHONE HCL PF 1 MG/ML IJ SOLN
1.0000 mg | Freq: Once | INTRAMUSCULAR | Status: AC
  Administered 2012-05-21: 1 mg via INTRAVENOUS
  Filled 2012-05-21: qty 1

## 2012-05-21 MED ORDER — DULOXETINE HCL 60 MG PO CPEP
60.0000 mg | ORAL_CAPSULE | Freq: Every day | ORAL | Status: DC
  Administered 2012-05-23: 60 mg via ORAL
  Filled 2012-05-21 (×2): qty 1

## 2012-05-21 MED ORDER — SODIUM CHLORIDE 0.9 % IV SOLN: 2000.0000 mg | INTRAVENOUS | Status: DC

## 2012-05-21 MED ORDER — ZINC SULFATE 220 (50 ZN) MG PO CAPS
220.0000 mg | ORAL_CAPSULE | Freq: Every day | ORAL | Status: DC
  Administered 2012-05-22 – 2012-06-04 (×14): 220 mg via ORAL
  Filled 2012-05-21 (×14): qty 1

## 2012-05-21 MED ORDER — LEVOFLOXACIN IN D5W 750 MG/150ML IV SOLN
750.0000 mg | Freq: Once | INTRAVENOUS | Status: AC
  Administered 2012-05-22: 750 mg via INTRAVENOUS
  Filled 2012-05-21: qty 150

## 2012-05-21 MED ORDER — MAGNESIUM OXIDE 400 MG PO TABS
400.0000 mg | ORAL_TABLET | Freq: Two times a day (BID) | ORAL | Status: DC
  Administered 2012-05-21 – 2012-05-24 (×6): 400 mg via ORAL
  Filled 2012-05-21 (×7): qty 1

## 2012-05-21 MED ORDER — FUROSEMIDE 10 MG/ML IJ SOLN
20.0000 mg | Freq: Once | INTRAMUSCULAR | Status: AC
  Administered 2012-05-22: 20 mg via INTRAVENOUS
  Filled 2012-05-21: qty 2
  Filled 2012-05-21: qty 4

## 2012-05-21 MED ORDER — INSULIN ASPART 100 UNIT/ML ~~LOC~~ SOLN: 0.0000 [IU] | Freq: Every day | SUBCUTANEOUS | Status: DC

## 2012-05-21 MED ORDER — SODIUM CHLORIDE 0.9 % IV SOLN
INTRAVENOUS | Status: DC
  Administered 2012-05-21: 16:00:00 via INTRAVENOUS

## 2012-05-21 MED ORDER — FUROSEMIDE 10 MG/ML IJ SOLN
60.0000 mg | Freq: Two times a day (BID) | INTRAMUSCULAR | Status: DC
  Administered 2012-05-22: 60 mg via INTRAVENOUS
  Filled 2012-05-21 (×4): qty 6

## 2012-05-21 MED ORDER — LEVOTHYROXINE SODIUM 25 MCG PO TABS
25.0000 ug | ORAL_TABLET | Freq: Every day | ORAL | Status: DC
  Administered 2012-05-22 – 2012-06-04 (×14): 25 ug via ORAL
  Filled 2012-05-21 (×18): qty 1

## 2012-05-21 MED ORDER — VITAMIN C 500 MG PO TABS
500.0000 mg | ORAL_TABLET | Freq: Every day | ORAL | Status: DC
  Administered 2012-05-22 – 2012-05-30 (×9): 500 mg via ORAL
  Filled 2012-05-21 (×11): qty 1

## 2012-05-21 MED ORDER — INSULIN ASPART 100 UNIT/ML ~~LOC~~ SOLN
0.0000 [IU] | Freq: Three times a day (TID) | SUBCUTANEOUS | Status: DC
  Administered 2012-05-22 – 2012-06-04 (×11): 1 [IU] via SUBCUTANEOUS

## 2012-05-21 MED ORDER — VITAMINS A & D EX OINT
TOPICAL_OINTMENT | CUTANEOUS | Status: AC
  Administered 2012-05-21: 5
  Filled 2012-05-21: qty 5

## 2012-05-21 MED ORDER — SENNA 8.6 MG PO TABS
1.0000 | ORAL_TABLET | Freq: Two times a day (BID) | ORAL | Status: DC
  Administered 2012-05-21 – 2012-06-04 (×24): 8.6 mg via ORAL
  Filled 2012-05-21 (×29): qty 1

## 2012-05-21 MED ORDER — FERROUS SULFATE 325 (65 FE) MG PO TABS
325.0000 mg | ORAL_TABLET | Freq: Two times a day (BID) | ORAL | Status: DC
  Administered 2012-05-21 – 2012-06-04 (×28): 325 mg via ORAL
  Filled 2012-05-21 (×33): qty 1

## 2012-05-21 MED ORDER — MORPHINE SULFATE 2 MG/ML IJ SOLN
2.0000 mg | INTRAMUSCULAR | Status: DC | PRN
  Administered 2012-05-21 – 2012-06-01 (×7): 2 mg via INTRAVENOUS
  Filled 2012-05-21 (×9): qty 1

## 2012-05-21 MED ORDER — SODIUM CHLORIDE 0.9 % IJ SOLN
3.0000 mL | Freq: Two times a day (BID) | INTRAMUSCULAR | Status: DC
  Administered 2012-05-23 – 2012-06-04 (×20): 3 mL via INTRAVENOUS

## 2012-05-21 MED ORDER — ASPIRIN EC 81 MG PO TBEC
81.0000 mg | DELAYED_RELEASE_TABLET | Freq: Every day | ORAL | Status: DC
  Administered 2012-05-22 – 2012-06-04 (×14): 81 mg via ORAL
  Filled 2012-05-21 (×16): qty 1

## 2012-05-21 MED ORDER — CYCLOBENZAPRINE HCL 10 MG PO TABS
10.0000 mg | ORAL_TABLET | Freq: Three times a day (TID) | ORAL | Status: DC | PRN
Start: 2012-05-21 — End: 2012-06-04
  Administered 2012-05-21 – 2012-06-04 (×6): 10 mg via ORAL
  Filled 2012-05-21 (×7): qty 1

## 2012-05-21 MED ORDER — ACETAMINOPHEN 325 MG PO TABS
650.0000 mg | ORAL_TABLET | Freq: Four times a day (QID) | ORAL | Status: DC | PRN
  Administered 2012-05-24: 650 mg via ORAL
  Filled 2012-05-21: qty 2

## 2012-05-21 MED ORDER — ONDANSETRON HCL 4 MG/2ML IJ SOLN
4.0000 mg | Freq: Four times a day (QID) | INTRAMUSCULAR | Status: DC | PRN
  Filled 2012-05-21: qty 2

## 2012-05-21 MED ORDER — DEXTROSE 5 % IV SOLN
1.0000 g | INTRAVENOUS | Status: AC
  Administered 2012-05-21: 1 g via INTRAVENOUS
  Filled 2012-05-21 (×2): qty 1

## 2012-05-21 MED ORDER — HYDRALAZINE HCL 25 MG PO TABS
37.5000 mg | ORAL_TABLET | Freq: Three times a day (TID) | ORAL | Status: DC
  Administered 2012-05-21 – 2012-06-04 (×35): 37.5 mg via ORAL
  Filled 2012-05-21 (×46): qty 1.5

## 2012-05-21 MED ORDER — VANCOMYCIN HCL 1000 MG IV SOLR
2500.0000 mg | Freq: Once | INTRAVENOUS | Status: AC
  Administered 2012-05-22: 2500 mg via INTRAVENOUS
  Filled 2012-05-21: qty 2500

## 2012-05-21 MED ORDER — FOLIC ACID 1 MG PO TABS
1.0000 mg | ORAL_TABLET | Freq: Every day | ORAL | Status: DC
  Administered 2012-05-22 – 2012-06-04 (×14): 1 mg via ORAL
  Filled 2012-05-21 (×15): qty 1

## 2012-05-21 MED ORDER — ACETAMINOPHEN 650 MG RE SUPP: 650.0000 mg | Freq: Four times a day (QID) | RECTAL | Status: DC | PRN

## 2012-05-21 MED ORDER — ONDANSETRON HCL 4 MG PO TABS: 4.0000 mg | ORAL_TABLET | Freq: Four times a day (QID) | ORAL | Status: DC | PRN

## 2012-05-21 NOTE — ED Notes (Signed)
Pt c/o pain and requested to be cleaned in her perineal area. Pt states that she usually wears a diaper, which was removed earlier in the day. Pt states that she had requested to be cleaned up hours earlier and was assured by the MD and RN that it would be completed but had ever been done. Nurse Tech and I cleaned the pt and applied gauze to larger sores.

## 2012-05-21 NOTE — ED Notes (Signed)
Pt reports seeing her PMD yesterday for SOB, distended abdomen and feeling tired.  Pt reports Hgb is 7, received a phone call last night.  Pt has hx of low Hgb in the past and has received 4 units of PRBCs.  Pt also reports SOB and swelling in her abdomen.  Pt also reports back pain which is chronic.

## 2012-05-21 NOTE — ED Provider Notes (Signed)
History     CSN: [id number]  Arrival date & time 05/21/12  1217   First MD Initiated Contact with Patient 05/21/12 1308     Chief Complaint:  Anemia, weak, sob.     (Consider location/radiation/quality/duration/timing/severity/associated sxs/prior treatment) The history is provided by the patient.  pt with hx hidradenitis, chf, presents w gradually progressive sob for past couple weeks.  Also notes generalized weakness, states had labs yesterday and was told hemoglobin 7. States large areas of hidradenitis in perirectal area, bil groin/perineum, axilla constantly drainage bloody, purulent fluid.  Pt/fam state unsure when last period as constantly has draining from these areas. No recent heavy vaginal bleeding. Denies melena or gross rectal bleeding. w chf, states sob, long standing orthopnea, no pnd. No increased leg edema. Denies chest pain. States compliant w meds, no recent change in meds. No fever or chills. No cough or uri c/o.     Past Medical History  Diagnosis Date  . Systolic congestive heart failure   . SOB (shortness of breath)   . HTN (hypertension)   . DM type 2 (diabetes mellitus, type 2)   . Chronic kidney disease (CKD), stage III (moderate)   . Iron deficiency anemia   . Morbid obesity   . Hyperkalemia   . Hypothyroidism   . CHF (congestive heart failure)     Past Surgical History  Procedure Date  . Portacath placement     Family History  Problem Relation Age of Onset  . Coronary artery disease Mother   . Hypertension Mother   . Diabetes type II Mother   . Malignant hyperthermia Mother   . Coronary artery disease Father   . Hypertension Father   . Malignant hyperthermia Father   . Cancer Maternal Grandfather     ? Type    History  Substance Use Topics  . Smoking status: Never Smoker   . Smokeless tobacco: Never Used  . Alcohol Use: No    OB History    Grav Para Term Preterm Abortions TAB SAB Ect Mult Living                  Review of  Systems  Constitutional: Negative for fever and chills.  HENT: Negative for sore throat, neck pain and neck stiffness.   Eyes: Negative for pain and redness.  Respiratory: Positive for shortness of breath.   Cardiovascular: Negative for chest pain and leg swelling.  Gastrointestinal: Negative for vomiting, abdominal pain and diarrhea.  Genitourinary: Negative for dysuria and flank pain.  Musculoskeletal: Negative for back pain.  Skin: Negative for rash.  Neurological: Negative for headaches.  Hematological: Does not bruise/bleed easily.  Psychiatric/Behavioral: Negative for confusion.    Allergies  Amoxicillin-pot clavulanate; Rosiglitazone maleate; and Amoxicillin  Home Medications   Current Outpatient Rx  Name Route Sig Dispense Refill  . ASCORBIC ACID 500 MG PO TABS Oral Take 500 mg by mouth daily.     . ASPIRIN 81 MG PO TBEC Oral Take 1 tablet (81 mg total) by mouth daily. 30 tablet 3  . CARVEDILOL 6.25 MG PO TABS Oral Take 6.25 mg by mouth 2 (two) times daily with a meal.    . CYCLOBENZAPRINE HCL 10 MG PO TABS Oral Take 10 mg by mouth 3 (three) times daily as needed. Spasms    . DULOXETINE HCL 60 MG PO CPEP Oral Take 60 mg by mouth daily.      Marland Kitchen FERROUS SULFATE 325 (65 FE) MG PO TABS Oral Take 325  mg by mouth 2 (two) times daily.      Marland Kitchen FOLIC ACID 1 MG PO TABS Oral Take 1 mg by mouth daily.    . FUROSEMIDE 20 MG PO TABS Oral Take 60 mg by mouth 2 (two) times daily.     Marland Kitchen HYDRALAZINE HCL 25 MG PO TABS Oral Take 37.5 mg by mouth 3 (three) times daily.    . INSULIN LISPRO PROT & LISPRO (75-25) 100 UNIT/ML [place] SUSP  Give 35 units subq every morning and 20 units sub q every PM     . ISOSORBIDE MONONITRATE ER 30 MG PO TB24 Oral Take 30 mg by mouth daily.    Marland Kitchen LEVOTHYROXINE SODIUM 25 MCG PO TABS Oral Take 25 mcg by mouth daily.    Marland Kitchen MAGNESIUM OXIDE 400 MG PO TABS Oral Take 400 mg by mouth 2 (two) times daily.     . OXYCODONE-ACETAMINOPHEN 5-325 MG PO TABS Oral Take 1 tablet by mouth  every 6 (six) hours as needed. For pain    . POTASSIUM CHLORIDE CRYS ER 20 MEQ PO TBCR Oral Take 20 mEq by mouth 2 (two) times daily.    Marland Kitchen PRAVASTATIN SODIUM 40 MG PO TABS Oral Take 40 mg by mouth daily.      Marland Kitchen ZINC SULFATE 220 MG PO CAPS Oral Take 220 mg by mouth daily.      BP 132/57  Pulse 90  Temp 97.8 F (36.6 C) (Oral)  Resp 18  SpO2 94%  Physical Exam  Nursing note and vitals reviewed. Constitutional: She is oriented to person, place, and time. She appears well-developed and well-nourished. No distress.  HENT:  Nose: Nose normal.  Mouth/Throat: Oropharynx is clear and moist.  Eyes: Conjunctivae are normal. No scleral icterus.  Neck: Normal range of motion. Neck supple. No JVD present. No tracheal deviation present.  Cardiovascular: Normal rate, regular rhythm, normal heart sounds and intact distal pulses.  Exam reveals no gallop and no friction rub.   No murmur heard. Pulmonary/Chest: Effort normal and breath sounds normal. No respiratory distress.       bs difficult to hear well due to body habitus.   Abdominal: Soft. Normal appearance and bowel sounds are normal. She exhibits no distension.       Morbidly obese  Musculoskeletal: She exhibits no edema and no tenderness.  Neurological: She is alert and oriented to person, place, and time.  Skin: Skin is warm and dry.       Hidradenitis perineum, groin, perirectal area, axilla draining purulent serosanguinous fluid.   Psychiatric: She has a normal mood and affect.    ED Course  Procedures (including critical care time)   Labs Reviewed  CBC  DIFFERENTIAL  COMPREHENSIVE METABOLIC PANEL  PREGNANCY, URINE  URINALYSIS, ROUTINE W REFLEX MICROSCOPIC  TYPE AND SCREEN  PRO B NATRIURETIC PEPTIDE  TROPONIN I   Results for orders placed during the hospital encounter of 05/21/12  CBC      Component Value Range   WBC 20.7 (*) 4.0 - 10.5 K/uL   RBC 2.94 (*) 3.87 - 5.11 MIL/uL   Hemoglobin 7.1 (*) 12.0 - 15.0 g/dL   HCT  23.8 (*) 36.0 - 46.0 %   MCV 81.0  78.0 - 100.0 fL   MCH 24.1 (*) 26.0 - 34.0 pg   MCHC 29.8 (*) 30.0 - 36.0 g/dL   RDW 21.3 (*) 11.5 - 15.5 %   Platelets 299  150 - 400 K/uL  DIFFERENTIAL  Component Value Range   Neutrophils Relative 87 (*) 43 - 77 %   Neutro Abs 17.8 (*) 1.7 - 7.7 K/uL   Lymphocytes Relative 5 (*) 12 - 46 %   Lymphs Abs 1.0  0.7 - 4.0 K/uL   Monocytes Relative 5  3 - 12 %   Monocytes Absolute 1.0  0.1 - 1.0 K/uL   Eosinophils Relative 4  0 - 5 %   Eosinophils Absolute 0.8 (*) 0.0 - 0.7 K/uL   Basophils Relative 0  0 - 1 %   Basophils Absolute 0.0  0.0 - 0.1 K/uL  COMPREHENSIVE METABOLIC PANEL      Component Value Range   Sodium 133 (*) 135 - 145 mEq/L   Potassium 5.2 (*) 3.5 - 5.1 mEq/L   Chloride 107  96 - 112 mEq/L   CO2 12 (*) 19 - 32 mEq/L   Glucose, Bld 157 (*) 70 - 99 mg/dL   BUN 66 (*) 6 - 23 mg/dL   Creatinine, Ser 4.06 (*) 0.50 - 1.10 mg/dL   Calcium 8.3 (*) 8.4 - 10.5 mg/dL   Total Protein 7.9  6.0 - 8.3 g/dL   Albumin 1.9 (*) 3.5 - 5.2 g/dL   AST 5  0 - 37 U/L   ALT <5  0 - 35 U/L   Alkaline Phosphatase 144 (*) 39 - 117 U/L   Total Bilirubin 0.2 (*) 0.3 - 1.2 mg/dL   GFR calc non Af Amer 12 (*) >90 mL/min   GFR calc Af Amer 14 (*) >90 mL/min  TYPE AND SCREEN      Component Value Range   ABO/RH(D) B POS     Antibody Screen NEG     Sample Expiration 05/24/2012    PRO B NATRIURETIC PEPTIDE      Component Value Range   Pro B Natriuretic peptide (BNP) 15828.0 (*) 0 - 125 pg/mL  TROPONIN I      Component Value Range   Troponin I <0.30  <0.30 ng/mL       MDM  Iv ns. Labs.   Reviewed nursing notes and prior charts for additional history.    Date: 05/21/2012  Rate: 86  Rhythm: normal sinus rhythm  QRS Axis: normal  Intervals: normal  ST/T Wave abnormalities: normal  Conduction Disutrbances:none  Narrative Interpretation:   Old EKG Reviewed: unchanged   bnp elev. cxr pending - pt initially refusing to have cxr done.  hgb  low, ?chf exacerb relating to signif anemia. Will transfuse. Given lasix 20 iv, and 20 after transfusion. Wbc elev, pt with multiple areas purulent drainage/infection - will rx ceph + vanc.  (note cxr w asymmetric edema/chf, pts abxs in ed for multiple infected skin lesions/hidrad w infxn, pt w no cough, no uri c/o, and feel clinically does not have acute pna).   Med service called for admission. [facility] will admit to tele, team 3.   Recheck pt, agreeable w plan for tx and admit. No change in exam, no resp distress.       Mirna Mires, MD 05/21/12 [phone]

## 2012-05-21 NOTE — H&P (Signed)
PCP:   [name], MD   Chief Complaint:  Shortness of breath for last 2 weeks. HPI: [name] presents with recurrent shortness of breath worse with exertion. She had similar complaints in May when she was hospitalized. She states that she has not been taking her meds routinely since discharge as she was preoccupied with a project she has been working on in the last 2 weeks. She denies cough/chest pain or fever. In Ed she was found to have acute on chronic anemia with hemoglobin of 7.1g/dl from a baseline of 9.8 a month ago. Her BNP was  15000, from 12000 in May. She also had a bump in creatinine from a baseline of 2.4 to 4.06. She has been started on iv abx, lasix, and prbc transfusion and will be admitted for further management. She also states that she does not need surgical assessment for the hydradenitis suprativa of the groin, as she has been referred to plastic surgery at [facility], and no surgery is contemplated.  Review of Systems:  Unremarkable except as highlighted in the hpi.  Past Medical History: Past Medical History  Diagnosis Date  . Systolic congestive heart failure   . SOB (shortness of breath)   . HTN (hypertension)   . DM type 2 (diabetes mellitus, type 2)   . Chronic kidney disease (CKD), stage III (moderate)   . Iron deficiency anemia   . Morbid obesity   . Hyperkalemia   . Hypothyroidism   . CHF (congestive heart failure)    Past Surgical History  Procedure Date  . Portacath placement     Medications: Prior to Admission medications   Medication Sig Start Date End Date Taking? Authorizing Provider  ascorbic acid (VITAMIN C) 500 MG tablet Take 500 mg by mouth daily.    Yes Historical Provider, MD  aspirin EC 81 MG EC tablet Take 1 tablet (81 mg total) by mouth daily. 01/30/12 01/29/13 Yes Ripudeep Krystal Eaton, MD  carvedilol (COREG) 6.25 MG tablet Take 6.25 mg by mouth 2 (two) times daily with a meal.   Yes Historical Provider, MD  cyclobenzaprine (FLEXERIL) 10 MG  tablet Take 10 mg by mouth 3 (three) times daily as needed. Spasms   Yes Historical Provider, MD  DULoxetine (CYMBALTA) 60 MG capsule Take 60 mg by mouth daily.     Yes Historical Provider, MD  ferrous sulfate 325 (65 FE) MG tablet Take 325 mg by mouth 2 (two) times daily.     Yes Historical Provider, MD  folic acid (FOLVITE) 1 MG tablet Take 1 mg by mouth daily.   Yes Historical Provider, MD  furosemide (LASIX) 20 MG tablet Take 60 mg by mouth 2 (two) times daily.  01/30/12 01/29/13 Yes Ripudeep Krystal Eaton, MD  hydrALAZINE (APRESOLINE) 25 MG tablet Take 37.5 mg by mouth 3 (three) times daily.   Yes Historical Provider, MD  insulin lispro protamine-insulin lispro (HUMALOG 75/25) (75-25) 100 UNIT/ML SUSP Give 35 units subq every morning and 20 units sub q every PM    Yes Historical Provider, MD  isosorbide mononitrate (IMDUR) 30 MG 24 hr tablet Take 30 mg by mouth daily.   Yes Historical Provider, MD  levothyroxine (SYNTHROID, LEVOTHROID) 25 MCG tablet Take 25 mcg by mouth daily.   Yes Historical Provider, MD  magnesium oxide (MAG-OX) 400 MG tablet Take 400 mg by mouth 2 (two) times daily.    Yes Historical Provider, MD  oxyCODONE-acetaminophen (PERCOCET) 5-325 MG per tablet Take 1 tablet by mouth every 6 (  six) hours as needed. For pain   Yes Historical Provider, MD  potassium chloride SA (K-DUR,KLOR-CON) 20 MEQ tablet Take 20 mEq by mouth 2 (two) times daily.   Yes Historical Provider, MD  pravastatin (PRAVACHOL) 40 MG tablet Take 40 mg by mouth daily.     Yes Historical Provider, MD  zinc sulfate 220 MG capsule Take 220 mg by mouth daily.   Yes Historical Provider, MD    Allergies:   Allergies  Allergen Reactions  . Amoxicillin-Pot Clavulanate Diarrhea  . Rosiglitazone Maleate     REACTION: swelling  . Amoxicillin Rash    Social History:  reports that she has never smoked. She has never used smokeless tobacco. She reports that she does not drink alcohol or use illicit drugs. Lives with her  husband.  Family History: Family History  Problem Relation Age of Onset  . Coronary artery disease Mother   . Hypertension Mother   . Diabetes type II Mother   . Malignant hyperthermia Mother   . Coronary artery disease Father   . Hypertension Father   . Malignant hyperthermia Father   . Cancer Maternal Grandfather     ? Type    Physical Exam: Filed Vitals:   05/21/12 1250 05/21/12 1644  BP: 132/57 95/47  Pulse: 90 82  Temp: 97.8 F (36.6 C) 98.1 F (36.7 C)  TempSrc: Oral Oral  Resp: 18 18  SpO2: 94% 100%   Lying in bed, morbidly obese. Mildly tachypneic. Pale. Bilateral air entry. No rhonchi or rales. S1S2. RRR. No murmurs. Obese abdomen, soft, non tender. +BS. hydradenitis suprativa in groin area with some oozing areas. CNS- grossly Intact. Extremities- no pedal edema.  Labs on Admission:   [facility] 05/21/12 1351  NA 133*  K 5.2*  CL 107  CO2 12*  GLUCOSE 157*  BUN 66*  CREATININE 4.06*  CALCIUM 8.3*  MG --  PHOS --    Basename 05/21/12 1351  AST 5  ALT <5  ALKPHOS 144*  BILITOT 0.2*  PROT 7.9  ALBUMIN 1.9*   No results found for this basename: LIPASE:2,AMYLASE:2 in the last 72 hours  Basename 05/21/12 1351  WBC 20.7*  NEUTROABS 17.8*  HGB 7.1*  HCT 23.8*  MCV 81.0  PLT 299    Basename 05/21/12 1351  CKTOTAL --  CKMB --  CKMBINDEX --  TROPONINI <0.30   No results found for this basename: TSH,T4TOTAL,FREET3,T3FREE,THYROIDAB in the last 72 hours No results found for this basename: VITAMINB12:2,FOLATE:2,FERRITIN:2,TIBC:2,IRON:2,RETICCTPCT:2 in the last 72 hours  Radiological Exams on Admission: Dg Chest Port 1 View  05/21/2012  *RADIOLOGY REPORT*  Clinical Data: Rule out pneumonia, cough, congestion, shortness of breath  PORTABLE CHEST - 1 VIEW  Comparison: 04/04/2012  Findings: Study is limited by patient's large body habitus. Cardiomegaly again noted.  There is asymmetric hazy airspace disease left lung and right base.  The findings may be  due to asymmetric edema or pneumonia.  Clinical correlation is necessary. Follow-up two-views chest is recommended.  IMPRESSION: .  Cardiomegaly again noted.  There is asymmetric hazy airspace disease left lung and right base.  The findings may be due to asymmetric edema or pneumonia.  Clinical correlation is necessary. Follow-up two-views chest is recommended.  Original Report Authenticated By: Lahoma Crocker, M.D.    Assessment  51 year old female with multiple comobids, here with acute on chronic systolic chf decompensation, ef 40%, apparently not taking her home meds in the last 2 weeks, and has acute on chronic anemia  likely exacerbating her chf, and has acute on chronic renal failure, ?cause, probably superinfection of the hydradenitis suprativa areas of the groin.  Plan   .CHF exacerbation- admit telemetry. Cardiac enzymes, lasix, beta blocker/statin, no acei due to acute renal failure. Daily weights. .Hidradenitis suppurativa- does not want surgical eval at this point. Wound care. Wound culture. Tripple abx. .Anemia- acute on chronic anemiaof chronic disease versus IDA. Transfuse 2 unit prbc. .Obesity (BMI 35.0-39.9 without comorbidity) .CKD (chronic kidney disease), stage III- acute on chronic, ? Infection/dehydration/meds. Renal ultrasound, adjust meds for crcl. Judicious fluids. .Diabetes mellitus type 2 in obese- switch to lantus/ssi in house. Dvt/gi prophylaxis. Condition very closely guarded.    [name] [id number] 05/21/2012, 6:57 PM

## 2012-05-21 NOTE — Progress Notes (Signed)
ANTIBIOTIC CONSULT NOTE - INITIAL  Pharmacy Consult for Vancomycin/Levaquin Indication: Perineal area abscesses  Allergies  Allergen Reactions  . Amoxicillin-Pot Clavulanate Diarrhea  . Rosiglitazone Maleate     REACTION: swelling  . Amoxicillin Rash    Patient Measurements: Height: 5\' 6"  (167.6 cm) Weight: 361 lb 8.9 oz (164 kg) IBW/kg (Calculated) : 59.3    Vital Signs: Temp: 99.5 F (37.5 C) (06/25 2154) Temp src: Oral (06/25 2154) BP: 126/82 mmHg (06/25 2154) Pulse Rate: 86  (06/25 2154) Intake/Output from previous day:   Intake/Output from this shift: Total I/O In: 300 [I.V.:300] Out: -   Labs:  Basename 05/21/12 1351  WBC 20.7*  HGB 7.1*  PLT 299  LABCREA --  CREATININE 4.06*   Estimated Creatinine Clearance: 26.5 ml/min (by C-G formula based on Cr of 4.06). No results found for this basename: VANCOTROUGH:2,VANCOPEAK:2,VANCORANDOM:2,GENTTROUGH:2,GENTPEAK:2,GENTRANDOM:2,TOBRATROUGH:2,TOBRAPEAK:2,TOBRARND:2,AMIKACINPEAK:2,AMIKACINTROU:2,AMIKACIN:2, in the last 72 hours   Microbiology: No results found for this or any previous visit (from the past 720 hour(s)).  Medical History: Past Medical History  Diagnosis Date  . Systolic congestive heart failure   . SOB (shortness of breath)   . HTN (hypertension)   . DM type 2 (diabetes mellitus, type 2)   . Chronic kidney disease (CKD), stage III (moderate)   . Iron deficiency anemia   . Morbid obesity   . Hyperkalemia   . Hypothyroidism   . CHF (congestive heart failure)     Medications:  Scheduled:    . aspirin EC  81 mg Oral Daily  . carvedilol  6.25 mg Oral BID WC  . cefOXitin  1 g Intravenous To ER  . clindamycin (CLEOCIN) IV  600 mg Intravenous Q6H  . DULoxetine  60 mg Oral Daily  . enoxaparin  30 mg Subcutaneous Q24H  . ferrous sulfate  325 mg Oral BID  . folic acid  1 mg Oral Daily  . furosemide  20 mg Intravenous Once  . furosemide  20 mg Intravenous Once  . furosemide  60 mg Intravenous  BID  . hydrALAZINE  37.5 mg Oral TID  . HYDROmorphone  1 mg Intravenous Once  . insulin aspart  0-5 Units Subcutaneous QHS  . insulin aspart  0-9 Units Subcutaneous TID WC  . insulin glargine  35 Units Subcutaneous QHS  . isosorbide mononitrate  30 mg Oral Daily  . levofloxacin (LEVAQUIN) IV  750 mg Intravenous Once  . levothyroxine  25 mcg Oral Daily  . magnesium oxide  400 mg Oral BID  . ondansetron (ZOFRAN) IV  4 mg Intravenous Once  . senna  1 tablet Oral BID  . simvastatin  20 mg Oral q1800  . sodium chloride  3 mL Intravenous Q12H  . vancomycin  2,500 mg Intravenous Once  . vitamin A & D      . ascorbic acid  500 mg Oral Daily  . zinc sulfate  220 mg Oral Daily  . DISCONTD: vancomycin  1,000 mg Intravenous To ER   Infusions:    . DISCONTD: sodium chloride 20 mL/hr at 05/21/12 1557   Assessment: 51 yo morbidly obese patient with perineal abscesses.  MD ordering Vancomycin and Levaquin for empiric therapy. Pt with elevated SCr, CrCl~19 ml/min (N).  Goal of Therapy:  Vancomycin trough level 15-20 mcg/ml  Plan:   Vancomycin 2500mg  iv load x1.   Levaquin 750mg  IV x1.  Vancomycin 2Gm IV q48h, f/u Scr and increase dose as renal function improves.  Levaquin 750mg  IV q48h.  F/u Vancomycin level @  C[facility].  Dorrene German 05/21/2012,10:24 PM

## 2012-05-21 NOTE — ED Notes (Addendum)
Received report from previous RN. Pt A&Ox4 but appeared labored in her breathing, so placed pt on monitor. Sats were 84%.  Placed pt on continuous monitoring and applied 2L O2 via [place].  Pt refused repositioning including raising the HOB to 30 degrees or greater.

## 2012-05-22 ENCOUNTER — Inpatient Hospital Stay (HOSPITAL_COMMUNITY): Payer: BC Managed Care – PPO

## 2012-05-22 DIAGNOSIS — I5023 Acute on chronic systolic (congestive) heart failure: Secondary | ICD-10-CM

## 2012-05-22 DIAGNOSIS — L732 Hidradenitis suppurativa: Secondary | ICD-10-CM

## 2012-05-22 DIAGNOSIS — N179 Acute kidney failure, unspecified: Secondary | ICD-10-CM

## 2012-05-22 DIAGNOSIS — R112 Nausea with vomiting, unspecified: Secondary | ICD-10-CM

## 2012-05-22 LAB — CARDIAC PANEL(CRET KIN+CKTOT+MB+TROPI)
CK, MB: 2.7 ng/mL (ref 0.3–4.0)
CK, MB: 3.5 ng/mL (ref 0.3–4.0)
Total CK: 47 U/L (ref 7–177)
Troponin I: <0.3 ng/mL (ref <0.30)

## 2012-05-22 LAB — PROTIME-INR
INR: 1.3 (ref 0.00–1.49)
Prothrombin Time: 16.4 seconds — ABNORMAL HIGH (ref 11.6–15.2)

## 2012-05-22 LAB — CBC
Hemoglobin: 9 g/dL — ABNORMAL LOW (ref 12.0–15.0)
MCH: 25.2 pg — ABNORMAL LOW (ref 26.0–34.0)
Platelets: 386 10*3/uL (ref 150–400)
RBC: 3.57 MIL/uL — ABNORMAL LOW (ref 3.87–5.11)

## 2012-05-22 LAB — TYPE AND SCREEN
ABO/RH(D): B POS
Unit division: 0

## 2012-05-22 LAB — URINALYSIS, ROUTINE W REFLEX MICROSCOPIC
Bilirubin Urine: NEGATIVE
Glucose, UA: NEGATIVE mg/dL
Nitrite: NEGATIVE
Specific Gravity, Urine: 1.026 (ref 1.005–1.030)
pH: 5 (ref 5.0–8.0)

## 2012-05-22 LAB — COMPREHENSIVE METABOLIC PANEL
BUN: 66 mg/dL — ABNORMAL HIGH (ref 6–23)
CO2: 14 mEq/L — ABNORMAL LOW (ref 19–32)
Calcium: 8.6 mg/dL (ref 8.4–10.5)
Chloride: 107 mEq/L (ref 96–112)
Creatinine, Ser: 4.22 mg/dL — ABNORMAL HIGH (ref 0.50–1.10)
GFR calc Af Amer: 13 mL/min — ABNORMAL LOW (ref >90)
GFR calc non Af Amer: 11 mL/min — ABNORMAL LOW (ref >90)
Glucose, Bld: 147 mg/dL — ABNORMAL HIGH (ref 70–99)
Total Bilirubin: 0.3 mg/dL (ref 0.3–1.2)

## 2012-05-22 LAB — GLUCOSE, CAPILLARY
Glucose-Capillary: 136 mg/dL — ABNORMAL HIGH (ref 70–99)
Glucose-Capillary: 137 mg/dL — ABNORMAL HIGH (ref 70–99)

## 2012-05-22 LAB — BASIC METABOLIC PANEL
CO2: 13 mEq/L — ABNORMAL LOW (ref 19–32)
Calcium: 8.5 mg/dL (ref 8.4–10.5)
GFR calc non Af Amer: 11 mL/min — ABNORMAL LOW (ref >90)
Glucose, Bld: 138 mg/dL — ABNORMAL HIGH (ref 70–99)
Potassium: 6.1 mEq/L — ABNORMAL HIGH (ref 3.5–5.1)
Sodium: 131 mEq/L — ABNORMAL LOW (ref 135–145)

## 2012-05-22 LAB — PRO B NATRIURETIC PEPTIDE: Pro B Natriuretic peptide (BNP): 15652 pg/mL — ABNORMAL HIGH (ref 0–125)

## 2012-05-22 LAB — URINE MICROSCOPIC-ADD ON

## 2012-05-22 LAB — PROTEIN / CREATININE RATIO, URINE
Creatinine, Urine: 172.35 mg/dL
Protein Creatinine Ratio: 1.01 — ABNORMAL HIGH (ref 0.00–0.15)
Total Protein, Urine: 174.4 mg/dL

## 2012-05-22 MED ORDER — ENOXAPARIN SODIUM 30 MG/0.3ML ~~LOC~~ SOLN
30.0000 mg | SUBCUTANEOUS | Status: DC
  Administered 2012-05-22 – 2012-06-03 (×12): 30 mg via SUBCUTANEOUS
  Filled 2012-05-22 (×15): qty 0.3

## 2012-05-22 MED ORDER — FUROSEMIDE 10 MG/ML IJ SOLN
40.0000 mg | Freq: Two times a day (BID) | INTRAMUSCULAR | Status: DC
  Administered 2012-05-22 – 2012-05-23 (×2): 40 mg via INTRAVENOUS
  Filled 2012-05-22 (×4): qty 4

## 2012-05-22 MED ORDER — SODIUM BICARBONATE 650 MG PO TABS
1300.0000 mg | ORAL_TABLET | Freq: Two times a day (BID) | ORAL | Status: DC
  Administered 2012-05-22 – 2012-06-04 (×27): 1300 mg via ORAL
  Filled 2012-05-22 (×30): qty 2

## 2012-05-22 MED ORDER — LORAZEPAM 2 MG/ML IJ SOLN: 0.5000 mg | Freq: Once | INTRAMUSCULAR | Status: DC

## 2012-05-22 MED ORDER — SODIUM POLYSTYRENE SULFONATE 15 GM/60ML PO SUSP
15.0000 g | Freq: Two times a day (BID) | ORAL | Status: DC
  Administered 2012-05-22: 15 g via ORAL
  Filled 2012-05-22 (×3): qty 60

## 2012-05-22 NOTE — Consult Note (Signed)
Urology Consult   Physician requesting consult: Samtani  Reason for consult: Hydronephrosis  History of Present Illness: [name] is a 51 y.o. female with PMH significant for morbid obesity, HTN, CHF, DM, CKD, and hidradenitis suprativa of the groin who was admitted 05/21/12 for increasing SOB and fatigue.  She is medically non compliant.  She was found to be anemic with hyperkalemia and metabolic acidosis as well as decompensated CHF.  As part of her eval, a renal U/S was performed which revealed new left sided hydronephrosis.  Urology consult was obtained regarding hydro.  Pt has had foley since admission.    She denies a hx of hydro and kidney stones.  She states that she has noted left sided back pain for approx 1 week, however, she states this felt like muscle spasms and only hurt when she ambulated.  Resting made the pain better.  She c/o frequency, feelings of incomplete empyting, and UUI all of which have been present for several years.  She wears a depends and states she is incontinent at least 3x/day.  She also notes RUQ discomfort with ambulation and coughing.  She is unable to determine if she has hematuria as she bleeds from her perineal wounds constantly.  She denies SUI, frequent UTIs, and N/V.  She does have constipation as she takes pain medications on a daily basis for her hidradenitis.  She currently denies CP and SOB.   Pt has been evaled by plastic surgery at [facility] for her groin wounds/hidradenitis.  She states no surgery was being planned.  Her wounds have been present for several years.  ID has been consulted this admission regarding these wounds.     Past Medical History  Diagnosis Date  . Systolic congestive heart failure   . SOB (shortness of breath)   . HTN (hypertension)   . DM type 2 (diabetes mellitus, type 2)   . Chronic kidney disease (CKD), stage III (moderate)   . Iron deficiency anemia   . Morbid obesity   . Hyperkalemia   . Hypothyroidism   . CHF  (congestive heart failure)   Hidradenitis suprativa with chronic groin wounds Left sided hydronephrosis GERD Pyoderma gangrenosum Pseudomonas infection  Past Surgical History  Procedure Date  . Portacath placement     Home meds:   Prior to Admission medications   Medication Sig Start Date End Date Taking? Authorizing Provider  ascorbic acid (VITAMIN C) 500 MG tablet Take 500 mg by mouth daily.    Yes Historical Provider, MD  aspirin EC 81 MG EC tablet Take 1 tablet (81 mg total) by mouth daily. 01/30/12 01/29/13 Yes Ripudeep Krystal Eaton, MD  carvedilol (COREG) 6.25 MG tablet Take 6.25 mg by mouth 2 (two) times daily with a meal.   Yes Historical Provider, MD  cyclobenzaprine (FLEXERIL) 10 MG tablet Take 10 mg by mouth 3 (three) times daily as needed. Spasms   Yes Historical Provider, MD  DULoxetine (CYMBALTA) 60 MG capsule Take 60 mg by mouth daily.     Yes Historical Provider, MD  ferrous sulfate 325 (65 FE) MG tablet Take 325 mg by mouth 2 (two) times daily.     Yes Historical Provider, MD  folic acid (FOLVITE) 1 MG tablet Take 1 mg by mouth daily.   Yes Historical Provider, MD  furosemide (LASIX) 20 MG tablet Take 60 mg by mouth 2 (two) times daily.  01/30/12 01/29/13 Yes Ripudeep Krystal Eaton, MD  hydrALAZINE (APRESOLINE) 25 MG tablet Take 37.5 mg by mouth  3 (three) times daily.   Yes Historical Provider, MD  insulin lispro protamine-insulin lispro (HUMALOG 75/25) (75-25) 100 UNIT/ML SUSP Give 35 units subq every morning and 20 units sub q every PM    Yes Historical Provider, MD  isosorbide mononitrate (IMDUR) 30 MG 24 hr tablet Take 30 mg by mouth daily.   Yes Historical Provider, MD  levothyroxine (SYNTHROID, LEVOTHROID) 25 MCG tablet Take 25 mcg by mouth daily.   Yes Historical Provider, MD  magnesium oxide (MAG-OX) 400 MG tablet Take 400 mg by mouth 2 (two) times daily.    Yes Historical Provider, MD  oxyCODONE-acetaminophen (PERCOCET) 5-325 MG per tablet Take 1 tablet by mouth every 6 (six) hours as  needed. For pain   Yes Historical Provider, MD  potassium chloride SA (K-DUR,KLOR-CON) 20 MEQ tablet Take 20 mEq by mouth 2 (two) times daily.   Yes Historical Provider, MD  pravastatin (PRAVACHOL) 40 MG tablet Take 40 mg by mouth daily.     Yes Historical Provider, MD  zinc sulfate 220 MG capsule Take 220 mg by mouth daily.   Yes Historical Provider, MD   Current Hospital Medications:  Scheduled Meds:   . aspirin EC  81 mg Oral Daily  . carvedilol  6.25 mg Oral BID WC  . cefOXitin  1 g Intravenous To ER  . clindamycin (CLEOCIN) IV  600 mg Intravenous Q6H  . DULoxetine  60 mg Oral Daily  . enoxaparin (LOVENOX) injection  30 mg Subcutaneous Q24H  . ferrous sulfate  325 mg Oral BID  . folic acid  1 mg Oral Daily  . furosemide  20 mg Intravenous Once  . furosemide  20 mg Intravenous Once  . furosemide  40 mg Intravenous BID  . hydrALAZINE  37.5 mg Oral TID  . HYDROmorphone  1 mg Intravenous Once  . insulin aspart  0-5 Units Subcutaneous QHS  . insulin aspart  0-9 Units Subcutaneous TID WC  . insulin glargine  35 Units Subcutaneous QHS  . isosorbide mononitrate  30 mg Oral Daily  . levofloxacin (LEVAQUIN) IV  750 mg Intravenous Once  . levofloxacin (LEVAQUIN) IV  750 mg Intravenous Q48H  . levothyroxine  25 mcg Oral QAC breakfast  . magnesium oxide  400 mg Oral BID  . ondansetron (ZOFRAN) IV  4 mg Intravenous Once  . senna  1 tablet Oral BID  . simvastatin  20 mg Oral q1800  . sodium bicarbonate  1,300 mg Oral BID  . sodium chloride  3 mL Intravenous Q12H  . vancomycin  2,500 mg Intravenous Once  . vitamin A & D      . ascorbic acid  500 mg Oral Daily  . zinc sulfate  220 mg Oral Daily  . DISCONTD: enoxaparin  30 mg Subcutaneous Q24H  . DISCONTD: enoxaparin (LOVENOX) injection  40 mg Subcutaneous Q24H  . DISCONTD: furosemide  60 mg Intravenous BID  . DISCONTD: vancomycin  2,000 mg Intravenous Q48H  . DISCONTD: vancomycin  1,000 mg Intravenous To ER   Continuous Infusions:    . DISCONTD: sodium chloride 20 mL/hr at 05/21/12 1557   PRN Meds:.acetaminophen, acetaminophen, cyclobenzaprine, morphine injection, ondansetron (ZOFRAN) IV, ondansetron, oxyCODONE-acetaminophen  Allergies:  Allergies  Allergen Reactions  . Amoxicillin-Pot Clavulanate Diarrhea  . Rosiglitazone Maleate     REACTION: swelling  . Amoxicillin Rash    Family History  Problem Relation Age of Onset  . Coronary artery disease Mother   . Hypertension Mother   . Diabetes type II  Mother   . Malignant hyperthermia Mother   . Coronary artery disease Father   . Hypertension Father   . Malignant hyperthermia Father   . Cancer Maternal Grandfather     ? Type    Social History:  reports that she has never smoked. She has never used smokeless tobacco. She reports that she does not drink alcohol or use illicit drugs.  ROS: A complete review of systems was performed.  All systems are negative except for pertinent findings as noted.  Physical Exam:  Vital signs in last 24 hours: Temp:  [98.1 F (36.7 C)-99.5 F (37.5 C)] 98.1 F (36.7 C) (06/26 0510) Pulse Rate:  [82-94] 84  (06/26 0900) Resp:  [18-30] 22  (06/26 0510) BP: (95-144)/(47-84) 126/82 mmHg (06/26 0900) SpO2:  [92 %-100 %] 95 % (06/26 0510) Weight:  [164 kg (361 lb 8.9 oz)-165.1 kg (363 lb 15.7 oz)] 165.1 kg (363 lb 15.7 oz) (06/26 0510) General:  Alert and oriented, No acute distress; morbidly obese HEENT: Normocephalic, atraumatic Neck: No lymphadenopathy Cardiovascular: Regular rate and rhythm Lungs: Clear bilaterally Abdomen: Soft, mildy tender LUQ; nondistended, no abdominal masses palpated; +BS Back: No CVA tenderness Extremities: No edema Neurologic: Grossly intact Urine: yellow Vaginal exam deferred  Laboratory Data:   Basename 05/22/12 0615 05/21/12 1351  WBC 21.5* 20.7*  HGB 9.0* 7.1*  HCT 29.9* 23.8*  PLT 386 299     Basename 05/22/12 1125 05/22/12 0615 05/21/12 1351  NA 131* 132* 133*  K 6.1*  6.0* 5.2*  CL 107 107 107  GLUCOSE 138* 147* 157*  BUN 66* 66* 66*  CALCIUM 8.5 8.6 8.3*  CREATININE 4.36* 4.22* 4.06*     Results for orders placed during the hospital encounter of 05/21/12 (from the past 24 hour(s))  TYPE AND SCREEN     Status: Normal   Collection Time   05/21/12  1:35 PM      Component Value Range   ABO/RH(D) B POS     Antibody Screen NEG     Sample Expiration 05/24/2012     Unit Number [id number]     Blood Component Type RED CELLS,LR     Unit division 00     Status of Unit ISSUED,FINAL     Transfusion Status OK TO TRANSFUSE     Crossmatch Result Compatible     Unit Number [id number]     Blood Component Type RED CELLS,LR     Unit division 00     Status of Unit ISSUED,FINAL     Transfusion Status OK TO TRANSFUSE     Crossmatch Result Compatible    CBC     Status: Abnormal   Collection Time   05/21/12  1:51 PM      Component Value Range   WBC 20.7 (*) 4.0 - 10.5 K/uL   RBC 2.94 (*) 3.87 - 5.11 MIL/uL   Hemoglobin 7.1 (*) 12.0 - 15.0 g/dL   HCT 23.8 (*) 36.0 - 46.0 %   MCV 81.0  78.0 - 100.0 fL   MCH 24.1 (*) 26.0 - 34.0 pg   MCHC 29.8 (*) 30.0 - 36.0 g/dL   RDW 21.3 (*) 11.5 - 15.5 %   Platelets 299  150 - 400 K/uL  DIFFERENTIAL     Status: Abnormal   Collection Time   05/21/12  1:51 PM      Component Value Range   Neutrophils Relative 87 (*) 43 - 77 %   Neutro Abs 17.8 (*) 1.7 - 7.7  K/uL   Lymphocytes Relative 5 (*) 12 - 46 %   Lymphs Abs 1.0  0.7 - 4.0 K/uL   Monocytes Relative 5  3 - 12 %   Monocytes Absolute 1.0  0.1 - 1.0 K/uL   Eosinophils Relative 4  0 - 5 %   Eosinophils Absolute 0.8 (*) 0.0 - 0.7 K/uL   Basophils Relative 0  0 - 1 %   Basophils Absolute 0.0  0.0 - 0.1 K/uL   RBC Morphology POLYCHROMASIA PRESENT     Smear Review LARGE PLATELETS PRESENT    COMPREHENSIVE METABOLIC PANEL     Status: Abnormal   Collection Time   05/21/12  1:51 PM      Component Value Range   Sodium 133 (*) 135 - 145 mEq/L   Potassium 5.2 (*) 3.5 - 5.1  mEq/L   Chloride 107  96 - 112 mEq/L   CO2 12 (*) 19 - 32 mEq/L   Glucose, Bld 157 (*) 70 - 99 mg/dL   BUN 66 (*) 6 - 23 mg/dL   Creatinine, Ser 4.06 (*) 0.50 - 1.10 mg/dL   Calcium 8.3 (*) 8.4 - 10.5 mg/dL   Total Protein 7.9  6.0 - 8.3 g/dL   Albumin 1.9 (*) 3.5 - 5.2 g/dL   AST 5  0 - 37 U/L   ALT <5  0 - 35 U/L   Alkaline Phosphatase 144 (*) 39 - 117 U/L   Total Bilirubin 0.2 (*) 0.3 - 1.2 mg/dL   GFR calc non Af Amer 12 (*) >90 mL/min   GFR calc Af Amer 14 (*) >90 mL/min  PRO B NATRIURETIC PEPTIDE     Status: Abnormal   Collection Time   05/21/12  1:51 PM      Component Value Range   Pro B Natriuretic peptide (BNP) 15828.0 (*) 0 - 125 pg/mL  TROPONIN I     Status: Normal   Collection Time   05/21/12  1:51 PM      Component Value Range   Troponin I <0.30  <0.30 ng/mL  PREPARE RBC (CROSSMATCH)     Status: Normal   Collection Time   05/21/12  3:30 PM      Component Value Range   Order Confirmation ORDER PROCESSED BY BLOOD BANK    PREGNANCY, URINE     Status: Normal   Collection Time   05/21/12  4:17 PM      Component Value Range   Preg Test, Ur NEGATIVE  NEGATIVE  URINALYSIS, ROUTINE W REFLEX MICROSCOPIC     Status: Abnormal   Collection Time   05/21/12  4:17 PM      Component Value Range   Color, Urine YELLOW  YELLOW   APPearance TURBID (*) CLEAR   Specific Gravity, Urine 1.021  1.005 - 1.030   pH 5.5  5.0 - 8.0   Glucose, UA NEGATIVE  NEGATIVE mg/dL   Hgb urine dipstick SMALL (*) NEGATIVE   Bilirubin Urine NEGATIVE  NEGATIVE   Ketones, ur NEGATIVE  NEGATIVE mg/dL   Protein, ur 30 (*) NEGATIVE mg/dL   Urobilinogen, UA 0.2  0.0 - 1.0 mg/dL   Nitrite NEGATIVE  NEGATIVE   Leukocytes, UA LARGE (*) NEGATIVE  URINE MICROSCOPIC-ADD ON     Status: Abnormal   Collection Time   05/21/12  4:17 PM      Component Value Range   Squamous Epithelial / LPF FEW (*) RARE   WBC, UA TOO NUMEROUS TO COUNT  <3  WBC/hpf   RBC / HPF 11-20  <3 RBC/hpf   Bacteria, UA MANY (*) RARE  WOUND  CULTURE     Status: Normal (Preliminary result)   Collection Time   05/21/12  9:13 PM      Component Value Range   Specimen Description OTHER     Special Requests NONE     Gram Stain       Value: NO WBC SEEN     FEW SQUAMOUS EPITHELIAL CELLS PRESENT     FEW GRAM POSITIVE COCCI IN PAIRS     FEW GRAM NEGATIVE COCCOBACILLI   Culture PENDING     Report Status PENDING    GLUCOSE, CAPILLARY     Status: Abnormal   Collection Time   05/21/12 10:05 PM      Component Value Range   Glucose-Capillary 132 (*) 70 - 99 mg/dL   Comment 1 Documented in Chart     Comment 2 Notify RN    CARDIAC PANEL(CRET KIN+CKTOT+MB+TROPI)     Status: Normal   Collection Time   05/22/12  6:15 AM      Component Value Range   Total CK 47  7 - 177 U/L   CK, MB 2.7  0.3 - 4.0 ng/mL   Troponin I <0.30  <0.30 ng/mL   Relative Index RELATIVE INDEX IS INVALID  0.0 - 2.5  PRO B NATRIURETIC PEPTIDE     Status: Abnormal   Collection Time   05/22/12  6:15 AM      Component Value Range   Pro B Natriuretic peptide (BNP) 15652.0 (*) 0 - 125 pg/mL  COMPREHENSIVE METABOLIC PANEL     Status: Abnormal   Collection Time   05/22/12  6:15 AM      Component Value Range   Sodium 132 (*) 135 - 145 mEq/L   Potassium 6.0 (*) 3.5 - 5.1 mEq/L   Chloride 107  96 - 112 mEq/L   CO2 14 (*) 19 - 32 mEq/L   Glucose, Bld 147 (*) 70 - 99 mg/dL   BUN 66 (*) 6 - 23 mg/dL   Creatinine, Ser 4.22 (*) 0.50 - 1.10 mg/dL   Calcium 8.6  8.4 - 10.5 mg/dL   Total Protein 8.6 (*) 6.0 - 8.3 g/dL   Albumin 2.0 (*) 3.5 - 5.2 g/dL   AST 6  0 - 37 U/L   ALT <5  0 - 35 U/L   Alkaline Phosphatase 148 (*) 39 - 117 U/L   Total Bilirubin 0.3  0.3 - 1.2 mg/dL   GFR calc non Af Amer 11 (*) >90 mL/min   GFR calc Af Amer 13 (*) >90 mL/min  CBC     Status: Abnormal   Collection Time   05/22/12  6:15 AM      Component Value Range   WBC 21.5 (*) 4.0 - 10.5 K/uL   RBC 3.57 (*) 3.87 - 5.11 MIL/uL   Hemoglobin 9.0 (*) 12.0 - 15.0 g/dL   HCT 29.9 (*) 36.0 - 46.0 %    MCV 83.8  78.0 - 100.0 fL   MCH 25.2 (*) 26.0 - 34.0 pg   MCHC 30.1  30.0 - 36.0 g/dL   RDW 20.3 (*) 11.5 - 15.5 %   Platelets 386  150 - 400 K/uL  PROTIME-INR     Status: Abnormal   Collection Time   05/22/12  6:15 AM      Component Value Range   Prothrombin Time 16.4 (*) 11.6 - 15.2  seconds   INR 1.30  0.00 - 1.49  APTT     Status: Abnormal   Collection Time   05/22/12  6:15 AM      Component Value Range   aPTT 42 (*) 24 - 37 seconds  GLUCOSE, CAPILLARY     Status: Abnormal   Collection Time   05/22/12  7:24 AM      Component Value Range   Glucose-Capillary 136 (*) 70 - 99 mg/dL  BASIC METABOLIC PANEL     Status: Abnormal   Collection Time   05/22/12 11:25 AM      Component Value Range   Sodium 131 (*) 135 - 145 mEq/L   Potassium 6.1 (*) 3.5 - 5.1 mEq/L   Chloride 107  96 - 112 mEq/L   CO2 13 (*) 19 - 32 mEq/L   Glucose, Bld 138 (*) 70 - 99 mg/dL   BUN 66 (*) 6 - 23 mg/dL   Creatinine, Ser 4.36 (*) 0.50 - 1.10 mg/dL   Calcium 8.5  8.4 - 10.5 mg/dL   GFR calc non Af Amer 11 (*) >90 mL/min   GFR calc Af Amer 13 (*) >90 mL/min  GLUCOSE, CAPILLARY     Status: Abnormal   Collection Time   05/22/12 11:53 AM      Component Value Range   Glucose-Capillary 137 (*) 70 - 99 mg/dL   Recent Results (from the past 240 hour(s))  WOUND CULTURE     Status: Normal (Preliminary result)   Collection Time   05/21/12  9:13 PM      Component Value Range Status Comment   Specimen Description OTHER   Final    Special Requests NONE   Final    Gram Stain     Final    Value: NO WBC SEEN     FEW SQUAMOUS EPITHELIAL CELLS PRESENT     FEW GRAM POSITIVE COCCI IN PAIRS     FEW GRAM NEGATIVE COCCOBACILLI   Culture PENDING   Incomplete    Report Status PENDING   Incomplete     Renal Function:  Basename 05/22/12 1125 05/22/12 0615 05/21/12 1351  CREATININE 4.36* 4.22* 4.06*   Estimated Creatinine Clearance: 24.8 ml/min (by C-G formula based on Cr of 4.36).  Radiologic Imaging: US  Renal  05/21/2012  *RADIOLOGY REPORT*  Clinical Data: Chronic renal disease with acute renal failure.  RENAL/URINARY TRACT ULTRASOUND COMPLETE  Comparison:  11/11/2010  Findings:  Right Kidney:  Visualization of the right kidney is somewhat limited.  Right renal length is measured at 13 cm.  There appears to be thinning of the parenchymal cortex with increased echogenic appearance of the parenchyma consistent with chronic medical renal disease.  There is a vague hyperechoic focus in the upper pole measuring 2.2 cm but is not well visualized.  A stone is not excluded.  No hydronephrosis.  Left Kidney:  The left kidney measures 15.4 cm length.  There is diffuse thinning and increased echotexture throughout the parenchyma consistent with chronic medical renal disease.  There is moderate pyelocaliectasis.  Urinary obstruction is suggested.  This is new since the previous study.  Bladder:  The bladder is decompressed with Foley catheter and is not visualized.  Incidental note of free fluid in the right upper quadrant and right lower quadrant consistent with ascites.  IMPRESSION: Diffuse parenchymal thinning and increased parenchymal echotexture consistent with medical renal disease bilaterally.  Poor visualization of the right kidney.  Upper pole stone is not excluded.  New hydronephrosis of the left kidney.  Right abdominal ascites.  Original Report Authenticated By: Neale Burly, M.D.   Dg Chest Port 1 View  05/21/2012  *RADIOLOGY REPORT*  Clinical Data: Rule out pneumonia, cough, congestion, shortness of breath  PORTABLE CHEST - 1 VIEW  Comparison: 04/04/2012  Findings: Study is limited by patient's large body habitus. Cardiomegaly again noted.  There is asymmetric hazy airspace disease left lung and right base.  The findings may be due to asymmetric edema or pneumonia.  Clinical correlation is necessary. Follow-up two-views chest is recommended.  IMPRESSION: .  Cardiomegaly again noted.  There is asymmetric  hazy airspace disease left lung and right base.  The findings may be due to asymmetric edema or pneumonia.  Clinical correlation is necessary. Follow-up two-views chest is recommended.  Original Report Authenticated By: Lahoma Crocker, M.D.    I independently reviewed the renal U/S with Dr. Matilde Sprang.  Impression/Assessment:  Medically complicated patient with multiple co-morbidities and non-compliance   1. UTI--? Contaminated specimen.  No culture sent  2. Left sided hydronephrosis with unknown etiology  3. ? hematuria--significant RBCs on UA, however, do not know if urine specimen was clean catch or          from cath.  If not cath specimen is likely contaminated from perineal wounds.    Plan:  1. Obtain cath urine specimen and send for culture.  Likely low yield at this point since she is receiving     several IV Abx  2. CT A/P wo contrast to eval kidneys and ureters.  Maintain foley cath  3. F/u cath urine specimen  YARBROUGH,AMANDA G. 05/22/2012, 1:15 PM

## 2012-05-22 NOTE — Progress Notes (Signed)
PT Cancellation Note  Evaluation cancelled today due to patient's refusal to participate.  Pt reports attempting to stand today with spouse and nurse and almost falling so pt did not wish to attempt mobilization again today.  Will check back tomorrow.  Kaylei Frink,KATHrine E 05/22/2012, 1:56 PM Pager: [phone]

## 2012-05-22 NOTE — Progress Notes (Signed)
PROGRESS NOTE  [name] [id number] DOB: Sep 17, 1961 DOA: 05/21/2012 PCP: [name], MD  Brief narrative: 51 yr old female admitted with shortness of breath Hidradenitis, AKI-states noncompliance with medications  Past medical history: Chronic hidradenitis + leukocytosis-seen at [facility], seen by infectious disease 01/19/12 admission admitted 04/04/12 for symptomatic anemia hemoglobin 6.7 Presumed nonischemic cardiomyopathy last EF 35%-40% 04/06/12 Chronic kidney disease stage III, followed by nephrology baseline creatinine 2.4 Type 2 diabetes mellitus, last A1c 8.5 Acute on chronic systolic congestive heart failure-admitted 01/19/12, Lasix 60 twice a day given. Admit 08/16/11-status post 4 units packed red blood cells History of hypothyroidism, ? MNG Morbid obesity Hyperlipidemia Gout Evaluated 02/08/11 by plastic surgery at [facility]  Consultants:  none  Procedures:  Renal ultrasound 6/25 = diffuse parenchymal thinning and increased parenchyma echotexture consistent medical renal disease bilaterally, poor visualization right kidney, upper pole stone not exclude, new hydronephrosis left kidney, right abdominal ascites  CXR 6/25=Cardiomegally noted again?Assymetric PNa/edema  Antibiotics:  Cefoxitin 6/25  Clindamycin 6/25  Levaquin 6/25  Vancomycin 6/25   Subjective  Patient states doing fair. States that she has left-sided flank pain. Patient states she has no shortness of breath or dysuria No nausea no vomiting. She denies any dysuria. Patient has no specific chest pain at present time. Patient is to call for    Objective    Interim History: Chart completely reviewed Wound nurse consult 6/26  Subjective:   Objective: Filed Vitals:   05/22/12 0155 05/22/12 0235 05/22/12 0510 05/22/12 0900  BP: 137/78 144/82 128/84 126/82  Pulse: 90 93 88 84  Temp: 98.3 F (36.8 C) 98.3 F (36.8 C) 98.1 F (36.7 C)   TempSrc: Oral Oral Oral   Resp: 24 30  22    Height:      Weight:   165.1 kg (363 lb 15.7 oz)   SpO2:   95%     Intake/Output Summary (Last 24 hours) at 05/22/12 1104 Last data filed at 05/22/12 0235  Gross per 24 hour  Intake 981.25 ml  Output      0 ml  Net 981.25 ml    Exam:  General: Obese pleasant African American female Cardiovascular: S1-S2 no murmur rub or gallop Respiratory: Clinically clear Abdomen: Soft nontender, planus noted. There is hidradenitis under abdomen, no overt bleeding but moist and slightly malodorous. Skin is hidradenitis under pannus stomach in intertriginous areas and in intertriginous areas of the folds of the back Neuro is intact  Data Reviewed: Basic Metabolic Panel:  Lab [id number] 0615 05/21/12 1351  NA 132* 133*  K 6.0* 5.2*  CL 107 107  CO2 14* 12*  GLUCOSE 147* 157*  BUN 66* 66*  CREATININE 4.22* 4.06*  CALCIUM 8.6 8.3*  MG -- --  PHOS -- --   Liver Function Tests:  Lab 05/22/12 0615 05/21/12 1351  AST 6 5  ALT <5 <5  ALKPHOS 148* 144*  BILITOT 0.3 0.2*  PROT 8.6* 7.9  ALBUMIN 2.0* 1.9*   No results found for this basename: LIPASE:5,AMYLASE:5 in the last 168 hours No results found for this basename: AMMONIA:5 in the last 168 hours CBC:  Lab 05/22/12 0615 05/21/12 1351  WBC 21.5* 20.7*  NEUTROABS -- 17.8*  HGB 9.0* 7.1*  HCT 29.9* 23.8*  MCV 83.8 81.0  PLT 386 299   Cardiac Enzymes:  Lab 05/22/12 0615 05/21/12 1351  CKTOTAL 47 --  CKMB 2.7 --  CKMBINDEX -- --  TROPONINI <0.30 <0.30   BNP: No components found with this  basename: POCBNP:5 CBG:  Lab 05/22/12 0724 05/21/12 2205  GLUCAP 136* 132*    Recent Results (from the past 240 hour(s))  WOUND CULTURE     Status: Normal (Preliminary result)   Collection Time   05/21/12  9:13 PM      Component Value Range Status Comment   Specimen Description OTHER   Final    Special Requests NONE   Final    Gram Stain     Final    Value: NO WBC SEEN     FEW SQUAMOUS EPITHELIAL CELLS PRESENT     FEW GRAM  POSITIVE COCCI IN PAIRS     FEW GRAM NEGATIVE COCCOBACILLI   Culture PENDING   Incomplete    Report Status PENDING   Incomplete      Studies:              All Imaging reviewed and is as per above notation   Scheduled Meds:   . aspirin EC  81 mg Oral Daily  . carvedilol  6.25 mg Oral BID WC  . cefOXitin  1 g Intravenous To ER  . clindamycin (CLEOCIN) IV  600 mg Intravenous Q6H  . DULoxetine  60 mg Oral Daily  . enoxaparin (LOVENOX) injection  30 mg Subcutaneous Q24H  . ferrous sulfate  325 mg Oral BID  . folic acid  1 mg Oral Daily  . furosemide  20 mg Intravenous Once  . furosemide  20 mg Intravenous Once  . hydrALAZINE  37.5 mg Oral TID  . HYDROmorphone  1 mg Intravenous Once  . insulin aspart  0-5 Units Subcutaneous QHS  . insulin aspart  0-9 Units Subcutaneous TID WC  . insulin glargine  35 Units Subcutaneous QHS  . isosorbide mononitrate  30 mg Oral Daily  . levofloxacin (LEVAQUIN) IV  750 mg Intravenous Once  . levofloxacin (LEVAQUIN) IV  750 mg Intravenous Q48H  . levothyroxine  25 mcg Oral QAC breakfast  . magnesium oxide  400 mg Oral BID  . ondansetron (ZOFRAN) IV  4 mg Intravenous Once  . senna  1 tablet Oral BID  . simvastatin  20 mg Oral q1800  . sodium chloride  3 mL Intravenous Q12H  . vancomycin  2,000 mg Intravenous Q48H  . vancomycin  2,500 mg Intravenous Once  . vitamin A & D      . ascorbic acid  500 mg Oral Daily  . zinc sulfate  220 mg Oral Daily  . DISCONTD: enoxaparin  30 mg Subcutaneous Q24H  . DISCONTD: enoxaparin (LOVENOX) injection  40 mg Subcutaneous Q24H  . DISCONTD: furosemide  60 mg Intravenous BID  . DISCONTD: vancomycin  1,000 mg Intravenous To ER   Continuous Infusions:   . DISCONTD: sodium chloride 20 mL/hr at 05/21/12 1557     Assessment/Plan: 1. Acute kidney injury-multifactorial, likely secondary to possible obstructive etiology (pain in left flank), anemia blood loss, toxicity of Lasix and vancomycin less likely etiologies.   Will order a Fena which would help to determine etiology. She is relatively oliguric despite being given 1 L of fluids inclusive of blood infusion and will consult nephrology-her bicarbonate is low and this could be secondary to a metabolic acidosis from kidney insufficiency-of note she also has a new hydronephrosis which could be secondary to an acute obstruction I will consult urology to get their opinion. It is unlikely she would be able to get a CT scan with contrast given her poor renal function--I have repeated her bmet  stat and if potassium is above 6.5 patient would benefit from Kayexalate, calcium gluconate etc. 2. Symptomatic anemia-likely anemia of chronic disease, status post 2 units packed red blood cells 6/25. We will repeat hemoglobin in the morning and reassess 3. Long-standing Hidradenitis suppuritva-patient has leukocytosis which is chronic and likely from this. She has a low-grade temp of 99.5.  She's been told in the past by her primary infectious disease Dr. Tommy Medal that the antibiotics are stopped working. I'm hesitant to keep her on vancomycin and other medications which could potentially worsen her kidney function. I will consult infectious disease for further recommendations regarding her care as she is complicated. She's been offered surgery in the past at [facility] and care also and ultimately may benefit from plastic surgeon consult.  4. Nonischemic cardiomyopathy last EF 04/06/2012 35-40%-patient has chronic systolic dysfunction, we will continue Lasix for now. We will involve cardiology her care if needed, would repeat chest x-ray in the morning. 5. Type 2 diabetes mellitus, last A1c 8.5-Blood sugars between [id number].  Continue management c SSI 6. Hypothyroid state history of possible multinodular goiter-continue Synthroid  Code Status: Full code Family Communication: Fully discussed with husband and patient at bedside. Disposition Plan: I have consulted nephrology,  infectious disease and will consult urology for multiple issues on this patient.   Verneita Griffes, MD  [facility] Pager [phone] 05/22/2012, 11:04 AM    LOS: 1 day

## 2012-05-22 NOTE — Consult Note (Signed)
WOC consult Note Reason for Consult: Wound consult requested for Hidradenitis Suprativa.  This is a systemic problem that is beyond [facility] scope of practice.  Topical wound care is not effective with this  diagnosis. According to the progress notes the pt has declined a surgical consult, which is the team that usually recommends plan of care for this diagnosis.  She is followed by plastics team at [facility] continuing whatever recommendations they have given her to use when not at the hospital for comfort measures. Will not plan to follow further unless re-consulted.  [address], [place], MSN, [place]

## 2012-05-22 NOTE — Consult Note (Signed)
Reason for Consult:AKI/CKD, hyperkalemia, metabolic acidosis Referring Physician: Verlon Au, MD  [name] is an 51 y.o. female.  HPI: Pt is a 51 yo AAF with PMH sig for morbid obesity, DM, HTN, CKD stage 3, CHF, medical noncompliance, and hidradenitis suppurative who was admitted 05/21/12 with a 1 week h/o increasing SOB and fatigue.  Also noted to have had a drop in Hgb and admits to nonadherence with her medications.  We were asked to see the patient due to AKI/CKD associated with hyperkalemia and metabolic acidosis.  Trend in creatinine is below.  Pt denies any NSAID's/COX II-I/ACE/ARB/IV contrast.  Trend in Creatinine: Creatinine, Ser  Date/Time Value Range Status  05/22/2012  6:15 AM 4.22* 0.50 - 1.10 mg/dL Final  05/21/2012  1:51 PM 4.06* 0.50 - 1.10 mg/dL Final  04/07/2012  4:45 AM 2.43* 0.50 - 1.10 mg/dL Final  04/06/2012  5:00 AM 2.40* 0.50 - 1.10 mg/dL Final  04/06/2012 12:35 AM 2.47* 0.50 - 1.10 mg/dL Final  Jan 29, 202013  1:38 AM 2.39* 0.50 - 1.10 mg/dL Final  04/04/2012 12:55 PM 2.36* 0.50 - 1.10 mg/dL Final  03/07/2012  4:52PM 1.90*    01/30/2012  5:50 AM 1.78* 0.50 - 1.10 mg/dL Final  01/29/2012  5:33 AM 1.74* 0.50 - 1.10 mg/dL Final  01/28/2012  6:40 AM 1.81* 0.50 - 1.10 mg/dL Final  01/27/2012  5:00 AM 1.93* 0.50 - 1.10 mg/dL Final  01/26/2012  7:00 AM 1.99* 0.50 - 1.10 mg/dL Final  01/24/2012  5:33 AM 1.95* 0.50 - 1.10 mg/dL Final  01/23/2012  6:00 AM 1.95* 0.50 - 1.10 mg/dL Final  01/22/2012  5:40 AM 2.12* 0.50 - 1.10 mg/dL Final  01/21/2012  5:30 AM 2.24* 0.50 - 1.10 mg/dL Final  01/19/2012  8:08 PM 2.36* 0.50 - 1.10 mg/dL Final  08/23/2011  5:35 AM 2.03* 0.50 - 1.10 mg/dL Final  08/22/2011 11:10 AM 1.98* 0.50 - 1.10 mg/dL Final  08/21/2011  4:00 AM 1.99* 0.50 - 1.10 mg/dL Final  08/20/2011  5:15 AM 1.78* 0.50 - 1.10 mg/dL Final  08/19/2011  5:05 AM 1.79* 0.50 - 1.10 mg/dL Final  08/18/2011  4:50 AM 1.59* 0.50 - 1.10 mg/dL Final  08/17/2011  4:00 AM 1.56* 0.50 - 1.10 mg/dL Final  08/16/2011  1:50  PM 1.49* 0.50 - 1.10 mg/dL Final  05/23/2011  1:15 PM 1.62* 0.50 - 1.10 mg/dL Final  02/19/2011  3:00 AM 1.14  0.4 - 1.2 mg/dL Final  02/18/2011  4:30 AM 1.22* 0.4 - 1.2 mg/dL Final  02/17/2011  3:25 AM 1.13  0.4 - 1.2 mg/dL Final  02/16/2011  3:40 AM 1.38* 0.4 - 1.2 mg/dL Final  02/15/2011  6:00 AM 1.22* 0.4 - 1.2 mg/dL Final  02/13/2011  4:35 AM 1.46* 0.4 - 1.2 mg/dL Final  02/12/2011  5:10 AM 1.53* 0.4 - 1.2 mg/dL Final  02/11/2011  5:20 AM 1.89* 0.4 - 1.2 mg/dL Final  02/10/2011  5:04 AM 1.99* 0.4 - 1.2 mg/dL Final  02/09/2011  5:07 AM 2.48* 0.4 - 1.2 mg/dL Final  02/08/2011  5:46 PM 2.67* 0.4 - 1.2 mg/dL Final  01/23/2011  8:51 PM 1.65* 0.40-1.20 mg/dL Final  11/14/2010  4:15 AM 1.73* 0.4 - 1.2 mg/dL Final  11/13/2010  4:10 AM 2.12* 0.4 - 1.2 mg/dL Final  11/12/2010  4:00 AM 2.83* 0.4 - 1.2 mg/dL Final  11/11/2010  2:42 PM 3.32* 0.4 - 1.2 mg/dL Final  11/11/2010  8:46 AM 3.40* 0.4 - 1.2 mg/dL Final  11/11/2010 12:20 AM 3.41* 0.4 - 1.2  mg/dL Final  11/10/2010  8:31 PM 3.45* 0.40-1.20 mg/dL Final  10/13/2010  8:11 PM 1.55* 0.40-1.20 mg/dL Final  09/19/2010  4:17 AM 1.60* 0.4 - 1.2 mg/dL Final  09/16/2010  5:40 AM 1.57* 0.4 - 1.2 mg/dL Final  09/15/2010  5:00 AM 1.70* 0.4 - 1.2 mg/dL Final  09/14/2010  4:50 AM 1.67* 0.4 - 1.2 mg/dL Final  09/13/2010  4:35 AM 1.77* 0.4 - 1.2 mg/dL Final  09/12/2010  3:45 AM 1.72* 0.4 - 1.2 mg/dL Final    PMH:   Past Medical History  Diagnosis Date  . Systolic congestive heart failure   . SOB (shortness of breath)   . HTN (hypertension)   . DM type 2 (diabetes mellitus, type 2)   . Chronic kidney disease (CKD), stage III (moderate)   . Iron deficiency anemia   . Morbid obesity   . Hyperkalemia   . Hypothyroidism   . CHF (congestive heart failure)     PSH:   Past Surgical History  Procedure Date  . Portacath placement     Allergies:  Allergies  Allergen Reactions  . Amoxicillin-Pot Clavulanate Diarrhea  . Rosiglitazone Maleate     REACTION:  swelling  . Amoxicillin Rash    Medications:   Prior to Admission medications   Medication Sig Start Date End Date Taking? Authorizing Provider  ascorbic acid (VITAMIN C) 500 MG tablet Take 500 mg by mouth daily.    Yes Historical Provider, MD  aspirin EC 81 MG EC tablet Take 1 tablet (81 mg total) by mouth daily. 01/30/12 01/29/13 Yes Ripudeep Krystal Eaton, MD  carvedilol (COREG) 6.25 MG tablet Take 6.25 mg by mouth 2 (two) times daily with a meal.   Yes Historical Provider, MD  cyclobenzaprine (FLEXERIL) 10 MG tablet Take 10 mg by mouth 3 (three) times daily as needed. Spasms   Yes Historical Provider, MD  DULoxetine (CYMBALTA) 60 MG capsule Take 60 mg by mouth daily.     Yes Historical Provider, MD  ferrous sulfate 325 (65 FE) MG tablet Take 325 mg by mouth 2 (two) times daily.     Yes Historical Provider, MD  folic acid (FOLVITE) 1 MG tablet Take 1 mg by mouth daily.   Yes Historical Provider, MD  furosemide (LASIX) 20 MG tablet Take 60 mg by mouth 2 (two) times daily.  01/30/12 01/29/13 Yes Ripudeep Krystal Eaton, MD  hydrALAZINE (APRESOLINE) 25 MG tablet Take 37.5 mg by mouth 3 (three) times daily.   Yes Historical Provider, MD  insulin lispro protamine-insulin lispro (HUMALOG 75/25) (75-25) 100 UNIT/ML SUSP Give 35 units subq every morning and 20 units sub q every PM    Yes Historical Provider, MD  isosorbide mononitrate (IMDUR) 30 MG 24 hr tablet Take 30 mg by mouth daily.   Yes Historical Provider, MD  levothyroxine (SYNTHROID, LEVOTHROID) 25 MCG tablet Take 25 mcg by mouth daily.   Yes Historical Provider, MD  magnesium oxide (MAG-OX) 400 MG tablet Take 400 mg by mouth 2 (two) times daily.    Yes Historical Provider, MD  oxyCODONE-acetaminophen (PERCOCET) 5-325 MG per tablet Take 1 tablet by mouth every 6 (six) hours as needed. For pain   Yes Historical Provider, MD  potassium chloride SA (K-DUR,KLOR-CON) 20 MEQ tablet Take 20 mEq by mouth 2 (two) times daily.   Yes Historical Provider, MD  pravastatin  (PRAVACHOL) 40 MG tablet Take 40 mg by mouth daily.     Yes Historical Provider, MD  zinc sulfate 220 MG capsule  Take 220 mg by mouth daily.   Yes Historical Provider, MD    Discontinued Meds:   Medications Discontinued During This Encounter  Medication Reason  . pantoprazole (PROTONIX) 40 MG tablet Patient has not taken in last 30 days  . carvedilol (COREG) 3.125 MG tablet   . sodium polystyrene (KAYEXALATE) 15 GM/60ML suspension Patient has not taken in last 30 days  . 0.9 %  sodium chloride infusion   . vancomycin (VANCOCIN) IVPB 1000 mg/200 mL premix   . enoxaparin (LOVENOX) injection 30 mg Dose change  . enoxaparin (LOVENOX) injection 40 mg   . furosemide (LASIX) injection 60 mg     Social History:  reports that she has never smoked. She has never used smokeless tobacco. She reports that she does not drink alcohol or use illicit drugs.  Family History:   Family History  Problem Relation Age of Onset  . Coronary artery disease Mother   . Hypertension Mother   . Diabetes type II Mother   . Malignant hyperthermia Mother   . Coronary artery disease Father   . Hypertension Father   . Malignant hyperthermia Father   . Cancer Maternal Grandfather     ? Type    A comprehensive review of systems was negative except for: Respiratory: positive for dyspnea on exertion Cardiovascular: positive for lower extremity edema and orthopnea Musculoskeletal: positive for back pain Painful hydradenitis with purulent bloody drainage on a daily basis Weight change:   Intake/Output Summary (Last 24 hours) at 05/22/12 1150 Last data filed at 05/22/12 0235  Gross per 24 hour  Intake 981.25 ml  Output      0 ml  Net 981.25 ml    General appearance: alert, no distress and morbidly obese Head: Normocephalic, without obvious abnormality, atraumatic Resp: diminished breath sounds bibasilar Cardio: regular rate and rhythm, S1, S2 normal, no murmur, click, rub or gallop GI: abnormal findings:   obese and multiple indurated and tender abscecess in perianal region Extremities: edema 1+ bilateral lower ext and multiple abscesses in peroneal area, tender with purulent drainage  Labs: Basic Metabolic Panel:  Lab [id number] 0615 05/21/12 1351  NA 132* 133*  K 6.0* 5.2*  CL 107 107  CO2 14* 12*  GLUCOSE 147* 157*  BUN 66* 66*  CREATININE 4.22* 4.06*  ALBUMIN 2.0* 1.9*  CALCIUM 8.6 8.3*  PHOS -- --   Liver Function Tests:  Lab 05/22/12 0615 05/21/12 1351  AST 6 5  ALT <5 <5  ALKPHOS 148* 144*  BILITOT 0.3 0.2*  PROT 8.6* 7.9  ALBUMIN 2.0* 1.9*   No results found for this basename: LIPASE:3,AMYLASE:3 in the last 168 hours No results found for this basename: AMMONIA:3 in the last 168 hours CBC:  Lab 05/22/12 0615 05/21/12 1351  WBC 21.5* 20.7*  NEUTROABS -- 17.8*  HGB 9.0* 7.1*  HCT 29.9* 23.8*  MCV 83.8 81.0  PLT 386 299   PT/INR: @labrcntip (inr:5) Cardiac Enzymes:  Lab 05/22/12 0615 05/21/12 1351  CKTOTAL 47 --  CKMB 2.7 --  CKMBINDEX -- --  TROPONINI <0.30 <0.30   CBG:  Lab 05/22/12 0724 05/21/12 2205  GLUCAP 136* 132*    Iron Studies: No results found for this basename: IRON:30,TIBC:30,TRANSFERRIN:30,FERRITIN:30 in the last 168 hours  Xrays/Other Studies: US Renal  05/21/2012  *RADIOLOGY REPORT*  Clinical Data: Chronic renal disease with acute renal failure.  RENAL/URINARY TRACT ULTRASOUND COMPLETE  Comparison:  11/11/2010  Findings:  Right Kidney:  Visualization of the right kidney is somewhat limited.  Right renal length is measured at 13 cm.  There appears to be thinning of the parenchymal cortex with increased echogenic appearance of the parenchyma consistent with chronic medical renal disease.  There is a vague hyperechoic focus in the upper pole measuring 2.2 cm but is not well visualized.  A stone is not excluded.  No hydronephrosis.  Left Kidney:  The left kidney measures 15.4 cm length.  There is diffuse thinning and increased echotexture  throughout the parenchyma consistent with chronic medical renal disease.  There is moderate pyelocaliectasis.  Urinary obstruction is suggested.  This is new since the previous study.  Bladder:  The bladder is decompressed with Foley catheter and is not visualized.  Incidental note of free fluid in the right upper quadrant and right lower quadrant consistent with ascites.  IMPRESSION: Diffuse parenchymal thinning and increased parenchymal echotexture consistent with medical renal disease bilaterally.  Poor visualization of the right kidney.  Upper pole stone is not excluded.  New hydronephrosis of the left kidney.  Right abdominal ascites.  Original Report Authenticated By: Neale Burly, M.D.   Dg Chest Port 1 View  05/21/2012  *RADIOLOGY REPORT*  Clinical Data: Rule out pneumonia, cough, congestion, shortness of breath  PORTABLE CHEST - 1 VIEW  Comparison: 04/04/2012  Findings: Study is limited by patient's large body habitus. Cardiomegaly again noted.  There is asymmetric hazy airspace disease left lung and right base.  The findings may be due to asymmetric edema or pneumonia.  Clinical correlation is necessary. Follow-up two-views chest is recommended.  IMPRESSION: .  Cardiomegaly again noted.  There is asymmetric hazy airspace disease left lung and right base.  The findings may be due to asymmetric edema or pneumonia.  Clinical correlation is necessary. Follow-up two-views chest is recommended.  Original Report Authenticated By: Lahoma Crocker, M.D.     Assessment/Plan: 1.  AKI/CKD- multifactorial.  Pt seen in consultation at our office on 04/02/12 and was felt to have progressive CKD related to poorly controlled chronic CHF.  Found to be profoundly anemic and sent to ED and was admitted for blood transfusion.  ANA, complements were negative at that time.  Pt now with hydro on Left side and poorly visualized right kidney.  Exam difficult due to morbid obesity.  Will consult urology to eval hydro as well  as possible fournier's gangrene/necrotizing fasciitis. 2. Hyperkalemia- add lasix and bicarb 3. Metabolic acidosis- start bicarb 4. CHF- start IV lasix 5. ABLA vs other process- w/u profound and recurrent anemia 6. Left sided hydro- consult urology as above 7. Obesity 8. Hydradenitis suppurative- agree with ID eval and hold off vanco dose due to AKI 9. DM- per primary svc 10. Hypoalbuminemia- will collect 24hr Uprot, spep/upep   Matsuko Kretz A 05/22/2012, 11:50 AM

## 2012-05-22 NOTE — Progress Notes (Signed)
   CARE MANAGEMENT NOTE 05/22/2012  Patient:  [facility]   Account Number:  [id number]  Date Initiated:  05/22/2012  Documentation initiated by:  Olga Coaster  Subjective/Objective Assessment:   ADMITTED WITH ANEMIA, CHF, SOB     Action/Plan:   PCP: [name], MD; LIVES AT HOME WITH SPOUSE   Anticipated DC Date:  05/29/2012   Anticipated DC Plan:  [place]  CM consult              Status of service:  In process, will continue to follow Medicare Important Message given?  NA - LOS <3 / Initial given by admissions (If response is "NO", the following Medicare IM given date fields will be blank)  Per UR Regulation:  Reviewed for med. necessity/level of care/duration of stay  Comments:  05/22/2012- B Chasitie Passey RN, BSN, MHA

## 2012-05-22 NOTE — Consult Note (Addendum)
Infectious Diseases Initial Consultation  Reason for Consultation:  Antibiotic management   HPI: [name] is a 51 y.o. female with a long history of hydradinitis suppurativa, morbid obesity, CHF and medical non-compliance who presented with SOB.  She stopped taking her lasix due to work stress.  Also noted to have low hgb on admission and ARF.  In regards to her HS, she has been followed by my partner, Dr. Tommy Medal and had been on antibiotics in the hopes of some relief of her symptoms.  She also was referred to plastic surgery at [facility] and was told that definitive therapy can be accomplished but will require about 1 year and she did not feel she was able to do that.  No recent fever, no chills.  Lesions are in the usual places.  No dysuria, no pyuria.  No fever.   Past Medical History  Diagnosis Date  . Systolic congestive heart failure   . SOB (shortness of breath)   . HTN (hypertension)   . DM type 2 (diabetes mellitus, type 2)   . Chronic kidney disease (CKD), stage III (moderate)   . Iron deficiency anemia   . Morbid obesity   . Hyperkalemia   . Hypothyroidism   . CHF (congestive heart failure)     Allergies:  Allergies  Allergen Reactions  . Amoxicillin-Pot Clavulanate Diarrhea  . Rosiglitazone Maleate     REACTION: swelling  . Amoxicillin Rash    Current antibiotics:   MEDICATIONS:    . aspirin EC  81 mg Oral Daily  . carvedilol  6.25 mg Oral BID WC  . cefOXitin  1 g Intravenous To ER  . DULoxetine  60 mg Oral Daily  . enoxaparin (LOVENOX) injection  30 mg Subcutaneous Q24H  . ferrous sulfate  325 mg Oral BID  . folic acid  1 mg Oral Daily  . furosemide  20 mg Intravenous Once  . furosemide  20 mg Intravenous Once  . furosemide  40 mg Intravenous BID  . hydrALAZINE  37.5 mg Oral TID  . insulin aspart  0-5 Units Subcutaneous QHS  . insulin aspart  0-9 Units Subcutaneous TID WC  . insulin glargine  35 Units Subcutaneous QHS  . isosorbide mononitrate  30 mg  Oral Daily  . levofloxacin (LEVAQUIN) IV  750 mg Intravenous Once  . levothyroxine  25 mcg Oral QAC breakfast  . LORazepam  0.5-1 mg Intravenous Once  . magnesium oxide  400 mg Oral BID  . senna  1 tablet Oral BID  . simvastatin  20 mg Oral q1800  . sodium bicarbonate  1,300 mg Oral BID  . sodium chloride  3 mL Intravenous Q12H  . vancomycin  2,500 mg Intravenous Once  . vitamin A & D      . ascorbic acid  500 mg Oral Daily  . zinc sulfate  220 mg Oral Daily  . DISCONTD: clindamycin (CLEOCIN) IV  600 mg Intravenous Q6H  . DISCONTD: enoxaparin  30 mg Subcutaneous Q24H  . DISCONTD: enoxaparin (LOVENOX) injection  40 mg Subcutaneous Q24H  . DISCONTD: furosemide  60 mg Intravenous BID  . DISCONTD: levofloxacin (LEVAQUIN) IV  750 mg Intravenous Q48H  . DISCONTD: vancomycin  2,000 mg Intravenous Q48H  . DISCONTD: vancomycin  1,000 mg Intravenous To ER    History  Substance Use Topics  . Smoking status: Never Smoker   . Smokeless tobacco: Never Used  . Alcohol Use: No    Family History  Problem Relation  Age of Onset  . Coronary artery disease Mother   . Hypertension Mother   . Diabetes type II Mother   . Malignant hyperthermia Mother   . Coronary artery disease Father   . Hypertension Father   . Malignant hyperthermia Father   . Cancer Maternal Grandfather     ? Type    Review of Systems - Negative except as per the HPI  OBJECTIVE: Temp:  [97.6 F (36.4 C)-99.5 F (37.5 C)] 97.6 F (36.4 C) (06/26 1412) Pulse Rate:  [70-94] 70  (06/26 1412) Resp:  [18-30] 22  (06/26 1412) BP: (95-144)/(47-84) 100/57 mmHg (06/26 1412) SpO2:  [92 %-100 %] 95 % (06/26 1412) Weight:  [361 lb 8.9 oz (164 kg)-363 lb 15.7 oz (165.1 kg)] 363 lb 15.7 oz (165.1 kg) (06/26 0510) General appearance: alert and morbidly obese Resp: clear to auscultation bilaterally and dfifficult to hear due to body habitus Cardio: regular rate and rhythm, S1, S2 normal, no murmur, click, rub or gallop GI: soft,  non-tender; bowel sounds normal; no masses,  no organomegaly and difficult exam due to body habitus Skin: multiple areas of skin lesions in gland areas and one in left flank  LABS: Results for orders placed during the hospital encounter of 05/21/12 (from the past 48 hour(s))  TYPE AND SCREEN     Status: Normal   Collection Time   05/21/12  1:35 PM      Component Value Range Comment   ABO/RH(D) B POS      Antibody Screen NEG      Sample Expiration 05/24/2012      Unit Number [id number]      Blood Component Type RED CELLS,LR      Unit division 00      Status of Unit ISSUED,FINAL      Transfusion Status OK TO TRANSFUSE      Crossmatch Result Compatible      Unit Number [id number]      Blood Component Type RED CELLS,LR      Unit division 00      Status of Unit ISSUED,FINAL      Transfusion Status OK TO TRANSFUSE      Crossmatch Result Compatible     CBC     Status: Abnormal   Collection Time   05/21/12  1:51 PM      Component Value Range Comment   WBC 20.7 (*) 4.0 - 10.5 K/uL    RBC 2.94 (*) 3.87 - 5.11 MIL/uL    Hemoglobin 7.1 (*) 12.0 - 15.0 g/dL    HCT 23.8 (*) 36.0 - 46.0 %    MCV 81.0  78.0 - 100.0 fL    MCH 24.1 (*) 26.0 - 34.0 pg    MCHC 29.8 (*) 30.0 - 36.0 g/dL    RDW 21.3 (*) 11.5 - 15.5 %    Platelets 299  150 - 400 K/uL   DIFFERENTIAL     Status: Abnormal   Collection Time   05/21/12  1:51 PM      Component Value Range Comment   Neutrophils Relative 87 (*) 43 - 77 %    Neutro Abs 17.8 (*) 1.7 - 7.7 K/uL    Lymphocytes Relative 5 (*) 12 - 46 %    Lymphs Abs 1.0  0.7 - 4.0 K/uL    Monocytes Relative 5  3 - 12 %    Monocytes Absolute 1.0  0.1 - 1.0 K/uL    Eosinophils Relative 4  0 - 5 %  Eosinophils Absolute 0.8 (*) 0.0 - 0.7 K/uL    Basophils Relative 0  0 - 1 %    Basophils Absolute 0.0  0.0 - 0.1 K/uL    RBC Morphology POLYCHROMASIA PRESENT      Smear Review LARGE PLATELETS PRESENT     COMPREHENSIVE METABOLIC PANEL     Status: Abnormal   Collection Time    05/21/12  1:51 PM      Component Value Range Comment   Sodium 133 (*) 135 - 145 mEq/L    Potassium 5.2 (*) 3.5 - 5.1 mEq/L    Chloride 107  96 - 112 mEq/L    CO2 12 (*) 19 - 32 mEq/L    Glucose, Bld 157 (*) 70 - 99 mg/dL    BUN 66 (*) 6 - 23 mg/dL    Creatinine, Ser 4.06 (*) 0.50 - 1.10 mg/dL    Calcium 8.3 (*) 8.4 - 10.5 mg/dL    Total Protein 7.9  6.0 - 8.3 g/dL    Albumin 1.9 (*) 3.5 - 5.2 g/dL    AST 5  0 - 37 U/L    ALT <5  0 - 35 U/L    Alkaline Phosphatase 144 (*) 39 - 117 U/L    Total Bilirubin 0.2 (*) 0.3 - 1.2 mg/dL    GFR calc non Af Amer 12 (*) >90 mL/min    GFR calc Af Amer 14 (*) >90 mL/min   PRO B NATRIURETIC PEPTIDE     Status: Abnormal   Collection Time   05/21/12  1:51 PM      Component Value Range Comment   Pro B Natriuretic peptide (BNP) 15828.0 (*) 0 - 125 pg/mL   TROPONIN I     Status: Normal   Collection Time   05/21/12  1:51 PM      Component Value Range Comment   Troponin I <0.30  <0.30 ng/mL   PREPARE RBC (CROSSMATCH)     Status: Normal   Collection Time   05/21/12  3:30 PM      Component Value Range Comment   Order Confirmation ORDER PROCESSED BY BLOOD BANK     PREGNANCY, URINE     Status: Normal   Collection Time   05/21/12  4:17 PM      Component Value Range Comment   Preg Test, Ur NEGATIVE  NEGATIVE   URINALYSIS, ROUTINE W REFLEX MICROSCOPIC     Status: Abnormal   Collection Time   05/21/12  4:17 PM      Component Value Range Comment   Color, Urine YELLOW  YELLOW    APPearance TURBID (*) CLEAR    Specific Gravity, Urine 1.021  1.005 - 1.030    pH 5.5  5.0 - 8.0    Glucose, UA NEGATIVE  NEGATIVE mg/dL    Hgb urine dipstick SMALL (*) NEGATIVE    Bilirubin Urine NEGATIVE  NEGATIVE    Ketones, ur NEGATIVE  NEGATIVE mg/dL    Protein, ur 30 (*) NEGATIVE mg/dL    Urobilinogen, UA 0.2  0.0 - 1.0 mg/dL    Nitrite NEGATIVE  NEGATIVE    Leukocytes, UA LARGE (*) NEGATIVE   URINE MICROSCOPIC-ADD ON     Status: Abnormal   Collection Time   05/21/12   4:17 PM      Component Value Range Comment   Squamous Epithelial / LPF FEW (*) RARE    WBC, UA TOO NUMEROUS TO COUNT  <3 WBC/hpf    RBC / HPF  11-20  <3 RBC/hpf    Bacteria, UA MANY (*) RARE   WOUND CULTURE     Status: Normal (Preliminary result)   Collection Time   05/21/12  9:13 PM      Component Value Range Comment   Specimen Description OTHER      Special Requests NONE      Gram Stain        Value: NO WBC SEEN     FEW SQUAMOUS EPITHELIAL CELLS PRESENT     FEW GRAM POSITIVE COCCI IN PAIRS     FEW GRAM NEGATIVE COCCOBACILLI   Culture PENDING      Report Status PENDING     GLUCOSE, CAPILLARY     Status: Abnormal   Collection Time   05/21/12 10:05 PM      Component Value Range Comment   Glucose-Capillary 132 (*) 70 - 99 mg/dL    Comment 1 Documented in Chart      Comment 2 Notify RN     CARDIAC PANEL(CRET KIN+CKTOT+MB+TROPI)     Status: Normal   Collection Time   05/22/12  6:15 AM      Component Value Range Comment   Total CK 47  7 - 177 U/L    CK, MB 2.7  0.3 - 4.0 ng/mL    Troponin I <0.30  <0.30 ng/mL    Relative Index RELATIVE INDEX IS INVALID  0.0 - 2.5   PRO B NATRIURETIC PEPTIDE     Status: Abnormal   Collection Time   05/22/12  6:15 AM      Component Value Range Comment   Pro B Natriuretic peptide (BNP) 15652.0 (*) 0 - 125 pg/mL   COMPREHENSIVE METABOLIC PANEL     Status: Abnormal   Collection Time   05/22/12  6:15 AM      Component Value Range Comment   Sodium 132 (*) 135 - 145 mEq/L    Potassium 6.0 (*) 3.5 - 5.1 mEq/L    Chloride 107  96 - 112 mEq/L    CO2 14 (*) 19 - 32 mEq/L    Glucose, Bld 147 (*) 70 - 99 mg/dL    BUN 66 (*) 6 - 23 mg/dL    Creatinine, Ser 4.22 (*) 0.50 - 1.10 mg/dL    Calcium 8.6  8.4 - 10.5 mg/dL    Total Protein 8.6 (*) 6.0 - 8.3 g/dL    Albumin 2.0 (*) 3.5 - 5.2 g/dL    AST 6  0 - 37 U/L    ALT <5  0 - 35 U/L REPEATED TO VERIFY   Alkaline Phosphatase 148 (*) 39 - 117 U/L    Total Bilirubin 0.3  0.3 - 1.2 mg/dL    GFR calc non Af  Amer 11 (*) >90 mL/min    GFR calc Af Amer 13 (*) >90 mL/min   CBC     Status: Abnormal   Collection Time   05/22/12  6:15 AM      Component Value Range Comment   WBC 21.5 (*) 4.0 - 10.5 K/uL    RBC 3.57 (*) 3.87 - 5.11 MIL/uL    Hemoglobin 9.0 (*) 12.0 - 15.0 g/dL    HCT 29.9 (*) 36.0 - 46.0 %    MCV 83.8  78.0 - 100.0 fL    MCH 25.2 (*) 26.0 - 34.0 pg    MCHC 30.1  30.0 - 36.0 g/dL    RDW 20.3 (*) 11.5 - 15.5 %    Platelets 386  150 - 400 K/uL   PROTIME-INR     Status: Abnormal   Collection Time   05/22/12  6:15 AM      Component Value Range Comment   Prothrombin Time 16.4 (*) 11.6 - 15.2 seconds    INR 1.30  0.00 - 1.49   APTT     Status: Abnormal   Collection Time   05/22/12  6:15 AM      Component Value Range Comment   aPTT 42 (*) 24 - 37 seconds   GLUCOSE, CAPILLARY     Status: Abnormal   Collection Time   05/22/12  7:24 AM      Component Value Range Comment   Glucose-Capillary 136 (*) 70 - 99 mg/dL   BASIC METABOLIC PANEL     Status: Abnormal   Collection Time   05/22/12 11:25 AM      Component Value Range Comment   Sodium 131 (*) 135 - 145 mEq/L    Potassium 6.1 (*) 3.5 - 5.1 mEq/L    Chloride 107  96 - 112 mEq/L    CO2 13 (*) 19 - 32 mEq/L    Glucose, Bld 138 (*) 70 - 99 mg/dL    BUN 66 (*) 6 - 23 mg/dL    Creatinine, Ser 4.36 (*) 0.50 - 1.10 mg/dL    Calcium 8.5  8.4 - 10.5 mg/dL    GFR calc non Af Amer 11 (*) >90 mL/min    GFR calc Af Amer 13 (*) >90 mL/min   GLUCOSE, CAPILLARY     Status: Abnormal   Collection Time   05/22/12 11:53 AM      Component Value Range Comment   Glucose-Capillary 137 (*) 70 - 99 mg/dL   PROTEIN / CREATININE RATIO, URINE     Status: Abnormal   Collection Time   05/22/12  1:04 PM      Component Value Range Comment   Creatinine, Urine 172.35      Total Protein, Urine 174.4   NO NORMAL RANGE ESTABLISHED FOR THIS TEST   PROTEIN CREATININE RATIO 1.01 (*) 0.00 - 0.15   CARDIAC PANEL(CRET KIN+CKTOT+MB+TROPI)     Status: Abnormal    Collection Time   05/22/12  2:20 PM      Component Value Range Comment   Total CK 101  7 - 177 U/L    CK, MB 3.5  0.3 - 4.0 ng/mL    Troponin I <0.30  <0.30 ng/mL    Relative Index 3.5 (*) 0.0 - 2.5     MICRO:  IMAGING: US Renal  05/21/2012  *RADIOLOGY REPORT*  Clinical Data: Chronic renal disease with acute renal failure.  RENAL/URINARY TRACT ULTRASOUND COMPLETE  Comparison:  11/11/2010  Findings:  Right Kidney:  Visualization of the right kidney is somewhat limited.  Right renal length is measured at 13 cm.  There appears to be thinning of the parenchymal cortex with increased echogenic appearance of the parenchyma consistent with chronic medical renal disease.  There is a vague hyperechoic focus in the upper pole measuring 2.2 cm but is not well visualized.  A stone is not excluded.  No hydronephrosis.  Left Kidney:  The left kidney measures 15.4 cm length.  There is diffuse thinning and increased echotexture throughout the parenchyma consistent with chronic medical renal disease.  There is moderate pyelocaliectasis.  Urinary obstruction is suggested.  This is new since the previous study.  Bladder:  The bladder is decompressed with Foley catheter and is not visualized.  Incidental note of free fluid in the right upper quadrant and right lower quadrant consistent with ascites.  IMPRESSION: Diffuse parenchymal thinning and increased parenchymal echotexture consistent with medical renal disease bilaterally.  Poor visualization of the right kidney.  Upper pole stone is not excluded.  New hydronephrosis of the left kidney.  Right abdominal ascites.  Original Report Authenticated By: Neale Burly, M.D.   Dg Chest Port 1 View  05/21/2012  *RADIOLOGY REPORT*  Clinical Data: Rule out pneumonia, cough, congestion, shortness of breath  PORTABLE CHEST - 1 VIEW  Comparison: 04/04/2012  Findings: Study is limited by patient's large body habitus. Cardiomegaly again noted.  There is asymmetric hazy airspace  disease left lung and right base.  The findings may be due to asymmetric edema or pneumonia.  Clinical correlation is necessary. Follow-up two-views chest is recommended.  IMPRESSION: .  Cardiomegaly again noted.  There is asymmetric hazy airspace disease left lung and right base.  The findings may be due to asymmetric edema or pneumonia.  Clinical correlation is necessary. Follow-up two-views chest is recommended.  Original Report Authenticated By: Lahoma Crocker, M.D.    HISTORICAL MICRO/IMAGING  Assessment/Plan:    1) Hydradinitis suppurativa - no role for antibiotics since this is not an infection and so have stopped the antibiotics.  No other signs of active infection.  She may need surgical drainage.  She was offered treatment with plastic surgery and I reaffirmed that she really needs to get this done at [facility].  I also restated that she would benefit from weight loss and the patient voiced her understanding.    2) UA - hard to determine if c/w infection in this patient.  No indication for treatment unless symptomatic.  Patient has had no symptoms per my history.    Thanks for the consult, please call if I can be of further assistance.

## 2012-05-22 NOTE — Progress Notes (Signed)
I have reviewed Ms. [name] notes and have discussed case. I have examined patient and i am not convinced she is having colic from obstruction.  She had no CVA tenderness today I agree with all recommendations including the urine c/s.  I will f/up after CT scan

## 2012-05-22 NOTE — Progress Notes (Signed)
Pt. Went down for ct scan earlier and states she felt like she could not breathe because they placed straps on her chest and abdomen. Notified Dr. Verlon Au for med to help her relax so she could tolerate exam. Explained all this to pt. And she still refuses.

## 2012-05-23 ENCOUNTER — Inpatient Hospital Stay (HOSPITAL_COMMUNITY): Payer: BC Managed Care – PPO

## 2012-05-23 ENCOUNTER — Ambulatory Visit (HOSPITAL_COMMUNITY)
Admit: 2012-05-23 | Discharge: 2012-05-23 | Disposition: A | Discharge status: Home / Self Care | Payer: BC Managed Care – PPO | Source: Ambulatory Visit | Attending: Family Medicine | Admitting: Family Medicine

## 2012-05-23 DIAGNOSIS — Z4901 Encounter for fitting and adjustment of extracorporeal dialysis catheter: Secondary | ICD-10-CM | POA: Insufficient documentation

## 2012-05-23 DIAGNOSIS — L732 Hidradenitis suppurativa: Secondary | ICD-10-CM

## 2012-05-23 DIAGNOSIS — I5023 Acute on chronic systolic (congestive) heart failure: Secondary | ICD-10-CM

## 2012-05-23 DIAGNOSIS — N179 Acute kidney failure, unspecified: Secondary | ICD-10-CM

## 2012-05-23 DIAGNOSIS — R112 Nausea with vomiting, unspecified: Secondary | ICD-10-CM

## 2012-05-23 LAB — GLUCOSE, CAPILLARY
Glucose-Capillary: 104 mg/dL — ABNORMAL HIGH (ref 70–99)
Glucose-Capillary: 133 mg/dL — ABNORMAL HIGH (ref 70–99)
Glucose-Capillary: 120 mg/dL — ABNORMAL HIGH (ref 70–99)
Glucose-Capillary: 117 mg/dL — ABNORMAL HIGH (ref 70–99)
Glucose-Capillary: 125 mg/dL — ABNORMAL HIGH (ref 70–99)

## 2012-05-23 LAB — MRSA PCR SCREENING: MRSA by PCR: NEGATIVE

## 2012-05-23 LAB — COMPREHENSIVE METABOLIC PANEL
ALT: <5 U/L (ref 0–35)
AST: 6 U/L (ref 0–37)
Albumin: 2 g/dL — ABNORMAL LOW (ref 3.5–5.2)
Alkaline Phosphatase: 146 U/L — ABNORMAL HIGH (ref 39–117)
BUN: 73 mg/dL — ABNORMAL HIGH (ref 6–23)
CO2: 13 mEq/L — ABNORMAL LOW (ref 19–32)
Calcium: 8.4 mg/dL (ref 8.4–10.5)
Chloride: 106 mEq/L (ref 96–112)
Creatinine, Ser: 4.99 mg/dL — ABNORMAL HIGH (ref 0.50–1.10)
GFR calc Af Amer: 11 mL/min — ABNORMAL LOW (ref >90)
GFR calc non Af Amer: 9 mL/min — ABNORMAL LOW (ref >90)
Glucose, Bld: 120 mg/dL — ABNORMAL HIGH (ref 70–99)
Potassium: 6.1 mEq/L — ABNORMAL HIGH (ref 3.5–5.1)
Sodium: 131 mEq/L — ABNORMAL LOW (ref 135–145)
Total Bilirubin: 0.3 mg/dL (ref 0.3–1.2)
Total Protein: 8.3 g/dL (ref 6.0–8.3)

## 2012-05-23 LAB — CBC
HCT: 27.7 % — ABNORMAL LOW (ref 36.0–46.0)
Hemoglobin: 8.2 g/dL — ABNORMAL LOW (ref 12.0–15.0)
MCH: 24.7 pg — ABNORMAL LOW (ref 26.0–34.0)
MCHC: 29.6 g/dL — ABNORMAL LOW (ref 30.0–36.0)
MCV: 83.4 fL (ref 78.0–100.0)
Platelets: 371 10*3/uL (ref 150–400)
RBC: 3.32 MIL/uL — ABNORMAL LOW (ref 3.87–5.11)
RDW: 20.6 % — ABNORMAL HIGH (ref 11.5–15.5)
WBC: 16.7 10*3/uL — ABNORMAL HIGH (ref 4.0–10.5)

## 2012-05-23 LAB — GLOMERULAR BASEMENT MEMBRANE ANTIBODIES: GBM Ab: 2 AU/mL (ref <20)

## 2012-05-23 LAB — ANTISTREPTOLYSIN O TITER: ASO: 681 IU/mL — ABNORMAL HIGH (ref 0–408)

## 2012-05-23 LAB — HEPATITIS PANEL, ACUTE
HCV Ab: REACTIVE — AB
Hep A IgM: NEGATIVE
Hep B C IgM: NEGATIVE
Hepatitis B Surface Ag: NEGATIVE

## 2012-05-23 LAB — URINE CULTURE
Colony Count: NO GROWTH
Culture  Setup Time: 201306262223

## 2012-05-23 LAB — ANTI-DNA ANTIBODY, DOUBLE-STRANDED: ds DNA Ab: 4 IU/mL (ref <30)

## 2012-05-23 LAB — PROTEIN, URINE, 24 HOUR
Collection Interval-UPROT: 24 hours
Protein, Urine: 93 mg/dL
Urine Total Volume-UPROT: 70 mL

## 2012-05-23 LAB — KAPPA/LAMBDA LIGHT CHAINS
Kappa free light chain: 50.4 mg/dL — ABNORMAL HIGH (ref 0.33–1.94)
Kappa, lambda light chain ratio: 2.43 — ABNORMAL HIGH (ref 0.26–1.65)
Lambda free light chains: 20.7 mg/dL — ABNORMAL HIGH (ref 0.57–2.63)

## 2012-05-23 LAB — C4 COMPLEMENT: Complement C4, Body Fluid: 14 mg/dL — ABNORMAL LOW (ref 10–40)

## 2012-05-23 LAB — ANA: Anti Nuclear Antibody(ANA): NEGATIVE

## 2012-05-23 LAB — C3 COMPLEMENT: C3 Complement: 144 mg/dL (ref 90–180)

## 2012-05-23 MED ORDER — SODIUM POLYSTYRENE SULFONATE 15 GM/60ML PO SUSP
30.0000 g | Freq: Two times a day (BID) | ORAL | Status: DC
  Administered 2012-05-23 (×2): 30 g via ORAL
  Filled 2012-05-23 (×5): qty 120

## 2012-05-23 MED ORDER — HEPARIN SODIUM (PORCINE) 1000 UNIT/ML IJ SOLN
INTRAMUSCULAR | Status: AC
  Filled 2012-05-23: qty 1

## 2012-05-23 MED ORDER — FUROSEMIDE 10 MG/ML IJ SOLN
120.0000 mg | Freq: Three times a day (TID) | INTRAVENOUS | Status: DC
  Administered 2012-05-23 – 2012-05-29 (×16): 120 mg via INTRAVENOUS
  Filled 2012-05-23 (×24): qty 12

## 2012-05-23 MED ORDER — MIDAZOLAM HCL 2 MG/2ML IJ SOLN
INTRAMUSCULAR | Status: AC
  Filled 2012-05-23: qty 4

## 2012-05-23 MED ORDER — SODIUM BICARBONATE 8.4 % IV SOLN
50.0000 meq | Freq: Once | INTRAVENOUS | Status: AC
  Administered 2012-05-23: 50 meq via INTRAVENOUS
  Filled 2012-05-23: qty 50

## 2012-05-23 MED ORDER — SODIUM POLYSTYRENE SULFONATE 15 GM/60ML PO SUSP
60.0000 g | Freq: Once | ORAL | Status: AC
  Administered 2012-05-23: 60 g via ORAL
  Filled 2012-05-23: qty 240

## 2012-05-23 MED ORDER — FENTANYL CITRATE 0.05 MG/ML IJ SOLN
INTRAMUSCULAR | Status: AC
  Filled 2012-05-23: qty 4

## 2012-05-23 MED ORDER — CIPROFLOXACIN IN D5W 400 MG/200ML IV SOLN: 400.0000 mg | INTRAVENOUS | Status: DC

## 2012-05-23 NOTE — Procedures (Signed)
L IJ 24cm Trialysis placed with pt in stretcher. Wire would not pass easily beyond R IJ vein. Pt could not tolerate horizontal positioning for perc nephrostomy or tunneled HD catheter. We can re-try once volume overload is corrected. No complication No blood loss. See complete dictation in [facility].

## 2012-05-23 NOTE — Progress Notes (Signed)
Pt admitted from [facility] via IR.  Pt has new temporary HD cath in R IJ.  VSS, pt oriented to unit, call bell at pt's side.  Family at bedside

## 2012-05-23 NOTE — Progress Notes (Signed)
PT/OT/ST Cancellation Note  ___Treatment cancelled today due to medical issues with patient which prohibited therapy  ___ Treatment cancelled today due to patient receiving procedure or test   _x__ Treatment cancelled this a.m due to patient's refusal to participate. Pt did not want to participate before having breakfast, husband arriving, having bath. Will check back later as schedule permits. Thanks.    ___ Treatment cancelled today due to   Signature: Weston Anna, PT [phone]

## 2012-05-23 NOTE — Interval H&P Note (Cosign Needed)
History and Physical Interval Note:  05/23/2012 1:23 PM  [name] is tent scheduled for left percutaneous nephrostomy tube placement and hemodialysis catheter placement today for renal failure/left hydronephrosis.The various methods of treatment have been discussed with the patient and family. After consideration of risks, benefits and other options for treatment, the patient has consented to  the above procedure .  The patient's history has been reviewed, patient examined, no change in status, stable for  the above procedure. Chest- CTA bilat, heart -RRR, abd obese, few BS, NT.  I have reviewed the patients' chart and labs.  Questions were answered to the patient's satisfaction.    Past Medical History  Diagnosis Date  . Systolic congestive heart failure   . SOB (shortness of breath)   . HTN (hypertension)   . DM type 2 (diabetes mellitus, type 2)   . Chronic kidney disease (CKD), stage III (moderate)   . Iron deficiency anemia   . Morbid obesity   . Hyperkalemia   . Hypothyroidism   . CHF (congestive heart failure)    Past Surgical History  Procedure Date  . Portacath placement    US Renal  05/21/2012  *RADIOLOGY REPORT*  Clinical Data: Chronic renal disease with acute renal failure.  RENAL/URINARY TRACT ULTRASOUND COMPLETE  Comparison:  11/11/2010  Findings:  Right Kidney:  Visualization of the right kidney is somewhat limited.  Right renal length is measured at 13 cm.  There appears to be thinning of the parenchymal cortex with increased echogenic appearance of the parenchyma consistent with chronic medical renal disease.  There is a vague hyperechoic focus in the upper pole measuring 2.2 cm but is not well visualized.  A stone is not excluded.  No hydronephrosis.  Left Kidney:  The left kidney measures 15.4 cm length.  There is diffuse thinning and increased echotexture throughout the parenchyma consistent with chronic medical renal disease.  There is moderate pyelocaliectasis.   Urinary obstruction is suggested.  This is new since the previous study.  Bladder:  The bladder is decompressed with Foley catheter and is not visualized.  Incidental note of free fluid in the right upper quadrant and right lower quadrant consistent with ascites.  IMPRESSION: Diffuse parenchymal thinning and increased parenchymal echotexture consistent with medical renal disease bilaterally.  Poor visualization of the right kidney.  Upper pole stone is not excluded.  New hydronephrosis of the left kidney.  Right abdominal ascites.  Original Report Authenticated By: Neale Burly, M.D.   Dg Chest Port 1 View  05/21/2012  *RADIOLOGY REPORT*  Clinical Data: Rule out pneumonia, cough, congestion, shortness of breath  PORTABLE CHEST - 1 VIEW  Comparison: 04/04/2012  Findings: Study is limited by patient's large body habitus. Cardiomegaly again noted.  There is asymmetric hazy airspace disease left lung and right base.  The findings may be due to asymmetric edema or pneumonia.  Clinical correlation is necessary. Follow-up two-views chest is recommended.  IMPRESSION: .  Cardiomegaly again noted.  There is asymmetric hazy airspace disease left lung and right base.  The findings may be due to asymmetric edema or pneumonia.  Clinical correlation is necessary. Follow-up two-views chest is recommended.  Original Report Authenticated By: Lahoma Crocker, M.D.  Results for orders placed during the hospital encounter of 05/21/12  CBC      Component Value Range   WBC 20.7 (*) 4.0 - 10.5 K/uL   RBC 2.94 (*) 3.87 - 5.11 MIL/uL   Hemoglobin 7.1 (*) 12.0 - 15.0 g/dL  HCT 23.8 (*) 36.0 - 46.0 %   MCV 81.0  78.0 - 100.0 fL   MCH 24.1 (*) 26.0 - 34.0 pg   MCHC 29.8 (*) 30.0 - 36.0 g/dL   RDW 21.3 (*) 11.5 - 15.5 %   Platelets 299  150 - 400 K/uL  DIFFERENTIAL      Component Value Range   Neutrophils Relative 87 (*) 43 - 77 %   Neutro Abs 17.8 (*) 1.7 - 7.7 K/uL   Lymphocytes Relative 5 (*) 12 - 46 %   Lymphs Abs 1.0   0.7 - 4.0 K/uL   Monocytes Relative 5  3 - 12 %   Monocytes Absolute 1.0  0.1 - 1.0 K/uL   Eosinophils Relative 4  0 - 5 %   Eosinophils Absolute 0.8 (*) 0.0 - 0.7 K/uL   Basophils Relative 0  0 - 1 %   Basophils Absolute 0.0  0.0 - 0.1 K/uL   RBC Morphology POLYCHROMASIA PRESENT     Smear Review LARGE PLATELETS PRESENT    COMPREHENSIVE METABOLIC PANEL      Component Value Range   Sodium 133 (*) 135 - 145 mEq/L   Potassium 5.2 (*) 3.5 - 5.1 mEq/L   Chloride 107  96 - 112 mEq/L   CO2 12 (*) 19 - 32 mEq/L   Glucose, Bld 157 (*) 70 - 99 mg/dL   BUN 66 (*) 6 - 23 mg/dL   Creatinine, Ser 4.06 (*) 0.50 - 1.10 mg/dL   Calcium 8.3 (*) 8.4 - 10.5 mg/dL   Total Protein 7.9  6.0 - 8.3 g/dL   Albumin 1.9 (*) 3.5 - 5.2 g/dL   AST 5  0 - 37 U/L   ALT <5  0 - 35 U/L   Alkaline Phosphatase 144 (*) 39 - 117 U/L   Total Bilirubin 0.2 (*) 0.3 - 1.2 mg/dL   GFR calc non Af Amer 12 (*) >90 mL/min   GFR calc Af Amer 14 (*) >90 mL/min  PREGNANCY, URINE      Component Value Range   Preg Test, Ur NEGATIVE  NEGATIVE  URINALYSIS, ROUTINE W REFLEX MICROSCOPIC      Component Value Range   Color, Urine YELLOW  YELLOW   APPearance TURBID (*) CLEAR   Specific Gravity, Urine 1.021  1.005 - 1.030   pH 5.5  5.0 - 8.0   Glucose, UA NEGATIVE  NEGATIVE mg/dL   Hgb urine dipstick SMALL (*) NEGATIVE   Bilirubin Urine NEGATIVE  NEGATIVE   Ketones, ur NEGATIVE  NEGATIVE mg/dL   Protein, ur 30 (*) NEGATIVE mg/dL   Urobilinogen, UA 0.2  0.0 - 1.0 mg/dL   Nitrite NEGATIVE  NEGATIVE   Leukocytes, UA LARGE (*) NEGATIVE  TYPE AND SCREEN      Component Value Range   ABO/RH(D) B POS     Antibody Screen NEG     Sample Expiration 05/24/2012     Unit Number [id number]     Blood Component Type RED CELLS,LR     Unit division 00     Status of Unit ISSUED,FINAL     Transfusion Status OK TO TRANSFUSE     Crossmatch Result Compatible     Unit Number [id number]     Blood Component Type RED CELLS,LR     Unit division 00      Status of Unit ISSUED,FINAL     Transfusion Status OK TO TRANSFUSE     Crossmatch Result Compatible    PRO  B NATRIURETIC PEPTIDE      Component Value Range   Pro B Natriuretic peptide (BNP) 15828.0 (*) 0 - 125 pg/mL  TROPONIN I      Component Value Range   Troponin I <0.30  <0.30 ng/mL  PREPARE RBC (CROSSMATCH)      Component Value Range   Order Confirmation ORDER PROCESSED BY BLOOD BANK    URINE MICROSCOPIC-ADD ON      Component Value Range   Squamous Epithelial / LPF FEW (*) RARE   WBC, UA TOO NUMEROUS TO COUNT  <3 WBC/hpf   RBC / HPF 11-20  <3 RBC/hpf   Bacteria, UA MANY (*) RARE  WOUND CULTURE      Component Value Range   Specimen Description OTHER     Special Requests NONE     Gram Stain       Value: NO WBC SEEN     FEW SQUAMOUS EPITHELIAL CELLS PRESENT     FEW GRAM POSITIVE COCCI IN PAIRS     FEW GRAM NEGATIVE COCCOBACILLI   Culture Culture reincubated for better growth     Report Status PENDING    CARDIAC PANEL(CRET KIN+CKTOT+MB+TROPI)      Component Value Range   Total CK 47  7 - 177 U/L   CK, MB 2.7  0.3 - 4.0 ng/mL   Troponin I <0.30  <0.30 ng/mL   Relative Index RELATIVE INDEX IS INVALID  0.0 - 2.5  PRO B NATRIURETIC PEPTIDE      Component Value Range   Pro B Natriuretic peptide (BNP) 15652.0 (*) 0 - 125 pg/mL  COMPREHENSIVE METABOLIC PANEL      Component Value Range   Sodium 132 (*) 135 - 145 mEq/L   Potassium 6.0 (*) 3.5 - 5.1 mEq/L   Chloride 107  96 - 112 mEq/L   CO2 14 (*) 19 - 32 mEq/L   Glucose, Bld 147 (*) 70 - 99 mg/dL   BUN 66 (*) 6 - 23 mg/dL   Creatinine, Ser 4.22 (*) 0.50 - 1.10 mg/dL   Calcium 8.6  8.4 - 10.5 mg/dL   Total Protein 8.6 (*) 6.0 - 8.3 g/dL   Albumin 2.0 (*) 3.5 - 5.2 g/dL   AST 6  0 - 37 U/L   ALT <5  0 - 35 U/L   Alkaline Phosphatase 148 (*) 39 - 117 U/L   Total Bilirubin 0.3  0.3 - 1.2 mg/dL   GFR calc non Af Amer 11 (*) >90 mL/min   GFR calc Af Amer 13 (*) >90 mL/min  CBC      Component Value Range   WBC 21.5 (*)  4.0 - 10.5 K/uL   RBC 3.57 (*) 3.87 - 5.11 MIL/uL   Hemoglobin 9.0 (*) 12.0 - 15.0 g/dL   HCT 29.9 (*) 36.0 - 46.0 %   MCV 83.8  78.0 - 100.0 fL   MCH 25.2 (*) 26.0 - 34.0 pg   MCHC 30.1  30.0 - 36.0 g/dL   RDW 20.3 (*) 11.5 - 15.5 %   Platelets 386  150 - 400 K/uL  PROTIME-INR      Component Value Range   Prothrombin Time 16.4 (*) 11.6 - 15.2 seconds   INR 1.30  0.00 - 1.49  APTT      Component Value Range   aPTT 42 (*) 24 - 37 seconds  GLUCOSE, CAPILLARY      Component Value Range   Glucose-Capillary 132 (*) 70 - 99 mg/dL  Comment 1 Documented in Chart     Comment 2 Notify RN    CARDIAC PANEL(CRET KIN+CKTOT+MB+TROPI)      Component Value Range   Total CK 101  7 - 177 U/L   CK, MB 3.5  0.3 - 4.0 ng/mL   Troponin I <0.30  <0.30 ng/mL   Relative Index 3.5 (*) 0.0 - 2.5  GLUCOSE, CAPILLARY      Component Value Range   Glucose-Capillary 136 (*) 70 - 99 mg/dL  BASIC METABOLIC PANEL      Component Value Range   Sodium 131 (*) 135 - 145 mEq/L   Potassium 6.1 (*) 3.5 - 5.1 mEq/L   Chloride 107  96 - 112 mEq/L   CO2 13 (*) 19 - 32 mEq/L   Glucose, Bld 138 (*) 70 - 99 mg/dL   BUN 66 (*) 6 - 23 mg/dL   Creatinine, Ser 4.36 (*) 0.50 - 1.10 mg/dL   Calcium 8.5  8.4 - 10.5 mg/dL   GFR calc non Af Amer 11 (*) >90 mL/min   GFR calc Af Amer 13 (*) >90 mL/min  GLUCOSE, CAPILLARY      Component Value Range   Glucose-Capillary 137 (*) 70 - 99 mg/dL  PROTEIN / CREATININE RATIO, URINE      Component Value Range   Creatinine, Urine 172.35     Total Protein, Urine 174.4     PROTEIN CREATININE RATIO 1.01 (*) 0.00 - 0.15  ANA      Component Value Range   ANA NEGATIVE  NEGATIVE  C3 COMPLEMENT      Component Value Range   C3 Complement 144  90 - 180 mg/dL  C4 COMPLEMENT      Component Value Range   Complement C4, Body Fluid 14 (*) 10 - 40 mg/dL  ANTISTREPTOLYSIN O TITER      Component Value Range   ASO 681 (*) 0 - 408 IU/mL  URINALYSIS, ROUTINE W REFLEX MICROSCOPIC      Component  Value Range   Color, Urine YELLOW  YELLOW   APPearance TURBID (*) CLEAR   Specific Gravity, Urine 1.026  1.005 - 1.030   pH 5.0  5.0 - 8.0   Glucose, UA NEGATIVE  NEGATIVE mg/dL   Hgb urine dipstick LARGE (*) NEGATIVE   Bilirubin Urine NEGATIVE  NEGATIVE   Ketones, ur TRACE (*) NEGATIVE mg/dL   Protein, ur 100 (*) NEGATIVE mg/dL   Urobilinogen, UA 0.2  0.0 - 1.0 mg/dL   Nitrite NEGATIVE  NEGATIVE   Leukocytes, UA LARGE (*) NEGATIVE  GLUCOSE, CAPILLARY      Component Value Range   Glucose-Capillary 130 (*) 70 - 99 mg/dL  COMPREHENSIVE METABOLIC PANEL      Component Value Range   Sodium 131 (*) 135 - 145 mEq/L   Potassium 6.1 (*) 3.5 - 5.1 mEq/L   Chloride 106  96 - 112 mEq/L   CO2 13 (*) 19 - 32 mEq/L   Glucose, Bld 120 (*) 70 - 99 mg/dL   BUN 73 (*) 6 - 23 mg/dL   Creatinine, Ser 4.99 (*) 0.50 - 1.10 mg/dL   Calcium 8.4  8.4 - 10.5 mg/dL   Total Protein 8.3  6.0 - 8.3 g/dL   Albumin 2.0 (*) 3.5 - 5.2 g/dL   AST 6  0 - 37 U/L   ALT <5  0 - 35 U/L   Alkaline Phosphatase 146 (*) 39 - 117 U/L   Total Bilirubin 0.3  0.3 -  1.2 mg/dL   GFR calc non Af Amer 9 (*) >90 mL/min   GFR calc Af Amer 11 (*) >90 mL/min  CBC      Component Value Range   WBC 16.7 (*) 4.0 - 10.5 K/uL   RBC 3.32 (*) 3.87 - 5.11 MIL/uL   Hemoglobin 8.2 (*) 12.0 - 15.0 g/dL   HCT 27.7 (*) 36.0 - 46.0 %   MCV 83.4  78.0 - 100.0 fL   MCH 24.7 (*) 26.0 - 34.0 pg   MCHC 29.6 (*) 30.0 - 36.0 g/dL   RDW 20.6 (*) 11.5 - 15.5 %   Platelets 371  150 - 400 K/uL  URINE MICROSCOPIC-ADD ON      Component Value Range   Squamous Epithelial / LPF RARE  RARE   WBC, UA TOO NUMEROUS TO COUNT  <3 WBC/hpf   RBC / HPF 7-10  <3 RBC/hpf   Bacteria, UA RARE  RARE   Casts HYALINE CASTS (*) NEGATIVE  GLUCOSE, CAPILLARY      Component Value Range   Glucose-Capillary 127 (*) 70 - 99 mg/dL   Comment 1 Documented in Chart     Comment 2 Notify RN    GLUCOSE, CAPILLARY      Component Value Range   Glucose-Capillary 104 (*) 70 -  99 mg/dL  GLUCOSE, CAPILLARY      Component Value Range   Glucose-Capillary 133 (*) 70 - 99 mg/dL    Christyan Reger,D [facility]

## 2012-05-23 NOTE — Progress Notes (Signed)
PT/OT/ST Cancellation Note  _x__Treatment cancelled today due to medical issues with patient which prohibited therapy-hold PT per RN. Pt transferring to [facility] today for procedure.   ___ Treatment cancelled today due to patient receiving procedure or test   ___ Treatment cancelled today due to patient's refusal to participate   ___ Treatment cancelled today due to   Signature:  Weston Anna, PT [phone]

## 2012-05-23 NOTE — ED Notes (Signed)
Pt unable to tolerate position for nephrostomy tube or perm. Cath. Procedure cancelled for now.

## 2012-05-23 NOTE — Progress Notes (Signed)
Report given to care link to be transferred to [facility] for procedues and then admitted there.

## 2012-05-23 NOTE — Progress Notes (Signed)
PROGRESS NOTE  [name] [id number] DOB: Dec 31, 1960 DOA: 05/21/2012 PCP: [name], MD  Brief narrative: 51 yr old female admitted with shortness of breath Hidradenitis, AKI-states noncompliance with medications  Past medical history: Chronic hidradenitis + leukocytosis-seen at [facility], seen by infectious disease 01/19/12 admission admitted 04/04/12 for symptomatic anemia hemoglobin 6.7 Presumed nonischemic cardiomyopathy last EF 35%-40% 04/06/12 Chronic kidney disease stage III, followed by nephrology baseline creatinine 2.4 Type 2 diabetes mellitus, last A1c 8.5 Acute on chronic systolic congestive heart failure-admitted 01/19/12, Lasix 60 twice a day given. Admit 08/16/11-status post 4 units packed red blood cells History of hypothyroidism, ? MNG Morbid obesity Hyperlipidemia Gout Evaluated 02/08/11 by plastic surgery at [facility]  Consultants:  Urology-Macdiarmid  Renal-Colodanata  ID-Comer  Cardiology-smith  Procedures:  Renal ultrasound 6/25 = diffuse parenchymal thinning and increased parenchyma echotexture consistent medical renal disease bilaterally, poor visualization right kidney, upper pole stone not exclude, new hydronephrosis left kidney, right abdominal ascites  CXR 6/25=Cardiomegally noted again?Asymetric PNa/edema  Antibiotics:  Cefoxitin 6/25  Clindamycin 6/25  Levaquin 6/25  Vancomycin 6/25   Subjective  Patient states she has increasing shortness of breath.  States that she has had difficulty  Breathing while on the table yesterday for CT urogram NO SOB/Cp/Blurred or double vision. Tearful about condition   Objective    Interim History: Chart completely reviewed Wound nurse consult 6/26 Note ID/Renal/Urology input  Subjective:   Objective: Filed Vitals:   05/22/12 0900 05/22/12 1412 05/22/12 2149 05/23/12 0500  BP: 126/82 100/57 102/63 108/64  Pulse: 84 70 70 70  Temp:  97.6 F (36.4 C) 98 F (36.7 C) 98.1 F  (36.7 C)  TempSrc:  Oral Oral Oral  Resp:  22 22 22   Height:      Weight:    167.9 kg (370 lb 2.4 oz)  SpO2:  95% 100% 96%    Intake/Output Summary (Last 24 hours) at 05/23/12 1150 Last data filed at 05/23/12 0600  Gross per 24 hour  Intake    320 ml  Output    105 ml  Net    215 ml    Exam:  General: Obese pleasant African American female Cardiovascular: S1-S2 no murmur rub or gallop Respiratory: Clinically clear Abdomen: Soft nontender, planus noted. There is hidradenitis under abdomen, no overt bleeding but moist and slightly malodorous. Skin is hidradenitis under pannus stomach in intertriginous areas and in intertriginous areas of the folds of the back Neuro is intact  Data Reviewed: Basic Metabolic Panel:  Lab [id number] 0415 05/22/12 1125 05/22/12 0615 05/21/12 1351  NA 131* 131* 132* 133*  K 6.1* 6.1* -- --  CL 106 107 107 107  CO2 13* 13* 14* 12*  GLUCOSE 120* 138* 147* 157*  BUN 73* 66* 66* 66*  CREATININE 4.99* 4.36* 4.22* 4.06*  CALCIUM 8.4 8.5 8.6 8.3*  MG -- -- -- --  PHOS -- -- -- --   Liver Function Tests:  Lab 05/23/12 0415 05/22/12 0615 05/21/12 1351  AST 6 6 5   ALT <5 <5 <5  ALKPHOS 146* 148* 144*  BILITOT 0.3 0.3 0.2*  PROT 8.3 8.6* 7.9  ALBUMIN 2.0* 2.0* 1.9*   No results found for this basename: LIPASE:5,AMYLASE:5 in the last 168 hours No results found for this basename: AMMONIA:5 in the last 168 hours CBC:  Lab 05/23/12 0415 05/22/12 0615 05/21/12 1351  WBC 16.7* 21.5* 20.7*  NEUTROABS -- -- 17.8*  HGB 8.2* 9.0* 7.1*  HCT 27.7* 29.9* 23.8*  MCV  83.4 83.8 81.0  PLT 371 386 299   Cardiac Enzymes:  Lab 05/22/12 1420 05/22/12 0615 05/21/12 1351  CKTOTAL 101 47 --  CKMB 3.5 2.7 --  CKMBINDEX -- -- --  TROPONINI <0.30 <0.30 <0.30   BNP: No components found with this basename: POCBNP:5 CBG:  Lab 05/23/12 0815 05/22/12 2146 05/22/12 1656 05/22/12 1153 05/22/12 0724  GLUCAP 104* 127* 130* 137* 136*    Recent Results (from the  past 240 hour(s))  WOUND CULTURE     Status: Normal (Preliminary result)   Collection Time   05/21/12  9:13 PM      Component Value Range Status Comment   Specimen Description OTHER   Final    Special Requests NONE   Final    Gram Stain     Final    Value: NO WBC SEEN     FEW SQUAMOUS EPITHELIAL CELLS PRESENT     FEW GRAM POSITIVE COCCI IN PAIRS     FEW GRAM NEGATIVE COCCOBACILLI   Culture Culture reincubated for better growth   Final    Report Status PENDING   Incomplete      Studies:              All Imaging reviewed and is as per above notation   Scheduled Meds:    . aspirin EC  81 mg Oral Daily  . carvedilol  6.25 mg Oral BID WC  . DULoxetine  60 mg Oral Daily  . enoxaparin (LOVENOX) injection  30 mg Subcutaneous Q24H  . ferrous sulfate  325 mg Oral BID  . folic acid  1 mg Oral Daily  . furosemide  40 mg Intravenous BID  . hydrALAZINE  37.5 mg Oral TID  . insulin aspart  0-5 Units Subcutaneous QHS  . insulin aspart  0-9 Units Subcutaneous TID WC  . insulin glargine  35 Units Subcutaneous QHS  . isosorbide mononitrate  30 mg Oral Daily  . levothyroxine  25 mcg Oral QAC breakfast  . LORazepam  0.5-1 mg Intravenous Once  . magnesium oxide  400 mg Oral BID  . senna  1 tablet Oral BID  . simvastatin  20 mg Oral q1800  . sodium bicarbonate  1,300 mg Oral BID  . sodium chloride  3 mL Intravenous Q12H  . sodium polystyrene  30 g Oral BID  . ascorbic acid  500 mg Oral Daily  . zinc sulfate  220 mg Oral Daily  . DISCONTD: clindamycin (CLEOCIN) IV  600 mg Intravenous Q6H  . DISCONTD: levofloxacin (LEVAQUIN) IV  750 mg Intravenous Q48H  . DISCONTD: sodium polystyrene  15 g Oral BID  . DISCONTD: vancomycin  2,000 mg Intravenous Q48H   Continuous Infusions:    Assessment/Plan: 1. Acute kidney injury-multifactorial, likely secondary to possible obstructive etiology (pain in left flank), anemia blood loss- toxicity of Lasix and vancomycin less likely etiologies.  Will order a  Fena which would help to determine etiology--Nephrology has seen patient and has ordered further work-up including hepatitis titers, SPEP, ANA, complement levels, antistreptolysin levels, anti-DNA antibodies-her antistreptolysin is elevated to 681, her C4 complement is borderline low.  It was thought that her kidney insufficiency could also issue to a subacute blockage of the left kidney. She has had nephrolithiasis back in 2011. Urology consult for patient and recommended that she undergo percutaneous drain of the left kidney which has been ordered stat. I've also ordered for death it is tender interventional radiology as well today.  She is relatively  oliguric despite being given 1 L of fluids inclusive of blood infusion and will consult nephrology-Lasix was increased 6/27 to 120 mg 3 times a day by nephrology, and she was placed on Kayexalate 15 mg yesterday which is increased to 60 mg once today with followup dose of 30 mg twice a day as she has not passed stool yet-she continues on Bicarbonate infusion for CKD and nephrology has spoken to patient about possibility of the need for Dialysis-I have discussed case with critical care Dr. Nelda Marseille who is aware of the patient should patient decompensate, patient potentially can be transferred to [facility] for further needs inclusive of dialysis onto the step down floor once vascular access for dialysis has been placed as well as perc drain has been placed 2. Symptomatic anemia-likely anemia of chronic disease, status post 2 units packed red blood cells 6/25.  Hemoglobin has been relatively stable.  If her hemoglobin drops to about 7 would recommend transfusion once. 3. 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 She has a low-grade temp of 99.5.  She's been told in the past by her primary infectious disease Dr. Tommy Medal that the antibiotics are stopped working. Appreciate ID input and patient has been  off antibiotics since 6/26. She's been offered surgery in the past at [facility] and care also and ultimately may benefit from plastic surgeon consult.  4. Nonischemic cardiomyopathy last EF 04/06/2012 35-40%-patient has chronic systolic dysfunction, we will continue Lasix for now. We will consult cardiology given she came in with likely florid congestive heart failure which seems to have worsened given her relatively oliguric state. 5. Type 2 diabetes mellitus, last A1c 8.5-Blood sugars between 120-137.  Continue management c SSI 6. Hypothyroid state history of possible multinodular goiter-continue Synthroid  Code Status: Full code Family Communication: Fully discussed with husband and patient at bedside. Disposition Plan: I have consulted nephrology, infectious disease and will consult urology for multiple issues on this patient.   Verneita Griffes, MD  [facility] Pager [phone] 05/23/2012, 11:50 AM    LOS: 2 days

## 2012-05-23 NOTE — Consult Note (Signed)
Admit date: 05/21/2012 Referring Physician  J. Samtani,MD Primary Physician  C. Dema Severin, MD Primary Cardiologist  Fransico Him, MD Reason for Consultation  CHF  ASSESSMENT: 1. Systolic heart failure is stable but tenuous due to volume overload related to kidney and urologic dysfunction. 2. Morbid obesity 3. CKD,stage 4 4. Chronic hidradenitis 5. Diabets mellitus  PLAN:  1. May need dialysis to control volume if renal function continues to decline. 2. Consider more aggressive diuretic dose, but willdefer to Nephrology 3. No specific cardiac w/u at this time. 4. Will inform Dr. Radford Pax of her presence   HPI: No specific cardiac complaints, but has mild shortness of breath with activity.No chest pain.   PMH:   Past Medical History  Diagnosis Date  . Systolic congestive heart failure   . SOB (shortness of breath)   . HTN (hypertension)   . DM type 2 (diabetes mellitus, type 2)   . Chronic kidney disease (CKD), stage III (moderate)   . Iron deficiency anemia   . Morbid obesity   . Hyperkalemia   . Hypothyroidism   . CHF (congestive heart failure)      PSH:   Past Surgical History  Procedure Date  . Portacath placement     Allergies:  Amoxicillin-pot clavulanate; Rosiglitazone maleate; and Amoxicillin Prior to Admit Meds:   Prescriptions prior to admission  Medication Sig Dispense Refill  . ascorbic acid (VITAMIN C) 500 MG tablet Take 500 mg by mouth daily.       Marland Kitchen aspirin EC 81 MG EC tablet Take 1 tablet (81 mg total) by mouth daily.  30 tablet  3  . carvedilol (COREG) 6.25 MG tablet Take 6.25 mg by mouth 2 (two) times daily with a meal.      . cyclobenzaprine (FLEXERIL) 10 MG tablet Take 10 mg by mouth 3 (three) times daily as needed. Spasms      . DULoxetine (CYMBALTA) 60 MG capsule Take 60 mg by mouth daily.        . ferrous sulfate 325 (65 FE) MG tablet Take 325 mg by mouth 2 (two) times daily.        . folic acid (FOLVITE) 1 MG tablet Take 1 mg by mouth daily.        . furosemide (LASIX) 20 MG tablet Take 60 mg by mouth 2 (two) times daily.       . hydrALAZINE (APRESOLINE) 25 MG tablet Take 37.5 mg by mouth 3 (three) times daily.      . insulin lispro protamine-insulin lispro (HUMALOG 75/25) (75-25) 100 UNIT/ML SUSP Give 35 units subq every morning and 20 units sub q every PM       . isosorbide mononitrate (IMDUR) 30 MG 24 hr tablet Take 30 mg by mouth daily.      Marland Kitchen levothyroxine (SYNTHROID, LEVOTHROID) 25 MCG tablet Take 25 mcg by mouth daily.      . magnesium oxide (MAG-OX) 400 MG tablet Take 400 mg by mouth 2 (two) times daily.       Marland Kitchen oxyCODONE-acetaminophen (PERCOCET) 5-325 MG per tablet Take 1 tablet by mouth every 6 (six) hours as needed. For pain      . potassium chloride SA (K-DUR,KLOR-CON) 20 MEQ tablet Take 20 mEq by mouth 2 (two) times daily.      . pravastatin (PRAVACHOL) 40 MG tablet Take 40 mg by mouth daily.        Marland Kitchen zinc sulfate 220 MG capsule Take 220 mg by mouth daily.  Fam HX:    Family History  Problem Relation Age of Onset  . Coronary artery disease Mother   . Hypertension Mother   . Diabetes type II Mother   . Malignant hyperthermia Mother   . Coronary artery disease Father   . Hypertension Father   . Malignant hyperthermia Father   . Cancer Maternal Grandfather     ? Type   Social HX:    History   Social History  . Marital Status: Married    Spouse Name: N/A    Number of Children: N/A  . Years of Education: N/A   Occupational History  . Not on file.   Social History Main Topics  . Smoking status: Never Smoker   . Smokeless tobacco: Never Used  . Alcohol Use: No  . Drug Use: No  . Sexually Active: No   Other Topics Concern  . Not on file   Social History Narrative  . No narrative on file     Review of Systems: No complaints other than pain from hidradenitis  Physical Exam: Blood pressure 115/76, pulse 81, temperature 98 F (36.7 C), temperature source Oral, resp. rate 19, height 5\' 6"  (1.676 m),  weight 167.9 kg (370 lb 2.4 oz), SpO2 95.00%. Weight change: 3.9 kg (8 lb 9.6 oz)  Distant heart sounds. Clear lungs anteriorly. Labs:   Lab Results  Component Value Date   WBC 16.7* 05/23/2012   HGB 8.2* 05/23/2012   HCT 27.7* 05/23/2012   MCV 83.4 05/23/2012   PLT 371 05/23/2012    Lab 05/23/12 0415  NA 131*  K 6.1*  CL 106  CO2 13*  BUN 73*  CREATININE 4.99*  CALCIUM 8.4  PROT 8.3  BILITOT 0.3  ALKPHOS 146*  ALT <5  AST 6  GLUCOSE 120*   No results found for this basename: PTT   Lab Results  Component Value Date   INR 1.30 05/22/2012   INR 1.40 10/30/202013   INR 1.43 01/29/2012   Lab Results  Component Value Date   CKTOTAL 101 05/22/2012   CKMB 3.5 05/22/2012   TROPONINI <0.30 05/22/2012     Lab Results  Component Value Date   CHOL 80 01/20/2012   CHOL  Value: 92        ATP III CLASSIFICATION:  <200     mg/dL   Desirable  200-239  mg/dL   Borderline High  >=240    mg/dL   High        12/20/2009   Lab Results  Component Value Date   HDL 28* 01/20/2012   HDL 31* 12/20/2009   Lab Results  Component Value Date   LDLCALC 41 01/20/2012   LDLCALC  Value: 52        Total Cholesterol/HDL:CHD Risk Coronary Heart Disease Risk Table                     Men   Women  1/2 Average Risk   3.4   3.3  Average Risk       5.0   4.4  2 X Average Risk   9.6   7.1  3 X Average Risk  23.4   11.0        Use the calculated Patient Ratio above and the CHD Risk Table to determine the patient's CHD Risk.        ATP III CLASSIFICATION (LDL):  <100     mg/dL   Optimal  100-129  mg/dL  Near or Above                    Optimal  130-159  mg/dL   Borderline  160-189  mg/dL   High  >190     mg/dL   Very High 12/20/2009   Lab Results  Component Value Date   TRIG 55 01/20/2012   TRIG 47 12/20/2009   Lab Results  Component Value Date   CHOLHDL 2.9 01/20/2012   CHOLHDL 3.0 12/20/2009   No results found for this basename: LDLDIRECT      Radiology:  Dg Chest 1 View  05/23/2012  *RADIOLOGY REPORT*  Clinical  Data: Diatek catheter placement  CHEST - 1 VIEW  Comparison: Portable exam 1727 hours compared to 05/21/2012  Findings: New left jugular large bore dual lumen central venous catheter. Tip is not well visualized due to body habitus, question projecting over the right atrium. No pneumothorax. Significant enlargement of cardiac silhouette with pulmonary vascular congestion. Low lung volumes. No gross infiltrate or effusion. Osseous mineralization normal.  IMPRESSION: No pneumothorax following Diatek catheter insertion. The tip of the catheter is suboptimally visualized due to body habitus and underpenetration, question projecting over the right atrium; if confirmation of the position of the catheter is required, recommend repeat imaging.  Original Report Authenticated By: Burnetta Sabin, M.D.   EKG:  NSR with vertical axis.  ECHO: 03/2012 ------------------------------------------------------------ Study Conclusions  - Left ventricle: Wall thickness was increased in a pattern of mild LVH. Systolic function was moderately reduced. The estimated ejection fraction was in the range of 35% to 40%. - Left atrium: The atrium was mildly dilated. - Right atrium: The atrium was moderately dilated. - Pulmonary arteries: Systolic pressure was moderately to severely increased. PA peak pressure: 97mm Hg (S).    Sinclair Grooms 05/23/2012 9:58 PM

## 2012-05-23 NOTE — Progress Notes (Signed)
Patient ID: [name], female   DOB: 07-14-1961, 51 y.o.   MRN: [id number] S:feels bad, had nausea this am O:BP 108/64  Pulse 70  Temp 98.1 F (36.7 C) (Oral)  Resp 22  Ht 5\' 6"  (1.676 m)  Wt 167.9 kg (370 lb 2.4 oz)  BMI 59.74 kg/m2  SpO2 96%  Intake/Output Summary (Last 24 hours) at 05/23/12 1146 Last data filed at 05/23/12 0600  Gross per 24 hour  Intake    320 ml  Output    105 ml  Net    215 ml   Weight change: 3.9 kg (8 lb 9.6 oz) Gen: morbidly obese AAF in mod distress and labored breathing while lying supine [id number] HS, no rub Resp:decreased BS at bases [id number], tense, mild tenderness, no guarding or rebound Ext:+edema/anasarca   Lab 05/23/12 0415 05/22/12 1125 05/22/12 0615 05/21/12 1351  NA 131* 131* 132* 133*  K 6.1* 6.1* 6.0* 5.2*  CL 106 107 107 107  CO2 13* 13* 14* 12*  GLUCOSE 120* 138* 147* 157*  BUN 73* 66* 66* 66*  CREATININE 4.99* 4.36* 4.22* 4.06*  ALBUMIN 2.0* -- 2.0* 1.9*  CALCIUM 8.4 8.5 8.6 8.3*  PHOS -- -- -- --  AST 6 -- 6 5  ALT <5 -- <5 <5   Liver Function Tests:  Lab 05/23/12 0415 05/22/12 0615 05/21/12 1351  AST 6 6 5   ALT <5 <5 <5  ALKPHOS 146* 148* 144*  BILITOT 0.3 0.3 0.2*  PROT 8.3 8.6* 7.9  ALBUMIN 2.0* 2.0* 1.9*   No results found for this basename: LIPASE:3,AMYLASE:3 in the last 168 hours No results found for this basename: AMMONIA:3 in the last 168 hours CBC:  Lab 05/23/12 0415 05/22/12 0615 05/21/12 1351  WBC 16.7* 21.5* 20.7*  NEUTROABS -- -- 17.8*  HGB 8.2* 9.0* 7.1*  HCT 27.7* 29.9* 23.8*  MCV 83.4 83.8 81.0  PLT 371 386 299   Cardiac Enzymes:  Lab 05/22/12 1420 05/22/12 0615 05/21/12 1351  CKTOTAL 101 47 --  CKMB 3.5 2.7 --  CKMBINDEX -- -- --  TROPONINI <0.30 <0.30 <0.30   CBG:  Lab 05/23/12 0815 05/22/12 2146 05/22/12 1656 05/22/12 1153 05/22/12 0724  GLUCAP 104* 127* 130* 137* 136*    Iron Studies: No results found for this basename: IRON,TIBC,TRANSFERRIN,FERRITIN in the last 72  hours Studies/Results: US Renal  05/21/2012  *RADIOLOGY REPORT*  Clinical Data: Chronic renal disease with acute renal failure.  RENAL/URINARY TRACT ULTRASOUND COMPLETE  Comparison:  11/11/2010  Findings:  Right Kidney:  Visualization of the right kidney is somewhat limited.  Right renal length is measured at 13 cm.  There appears to be thinning of the parenchymal cortex with increased echogenic appearance of the parenchyma consistent with chronic medical renal disease.  There is a vague hyperechoic focus in the upper pole measuring 2.2 cm but is not well visualized.  A stone is not excluded.  No hydronephrosis.  Left Kidney:  The left kidney measures 15.4 cm length.  There is diffuse thinning and increased echotexture throughout the parenchyma consistent with chronic medical renal disease.  There is moderate pyelocaliectasis.  Urinary obstruction is suggested.  This is new since the previous study.  Bladder:  The bladder is decompressed with Foley catheter and is not visualized.  Incidental note of free fluid in the right upper quadrant and right lower quadrant consistent with ascites.  IMPRESSION: Diffuse parenchymal thinning and increased parenchymal echotexture consistent with medical renal disease bilaterally.  Poor visualization of the  right kidney.  Upper pole stone is not excluded.  New hydronephrosis of the left kidney.  Right abdominal ascites.  Original Report Authenticated By: Neale Burly, M.D.   Dg Chest Port 1 View  05/21/2012  *RADIOLOGY REPORT*  Clinical Data: Rule out pneumonia, cough, congestion, shortness of breath  PORTABLE CHEST - 1 VIEW  Comparison: 04/04/2012  Findings: Study is limited by patient's large body habitus. Cardiomegaly again noted.  There is asymmetric hazy airspace disease left lung and right base.  The findings may be due to asymmetric edema or pneumonia.  Clinical correlation is necessary. Follow-up two-views chest is recommended.  IMPRESSION: .  Cardiomegaly again  noted.  There is asymmetric hazy airspace disease left lung and right base.  The findings may be due to asymmetric edema or pneumonia.  Clinical correlation is necessary. Follow-up two-views chest is recommended.  Original Report Authenticated By: Lahoma Crocker, M.D.      . aspirin EC  81 mg Oral Daily  . carvedilol  6.25 mg Oral BID WC  . DULoxetine  60 mg Oral Daily  . enoxaparin (LOVENOX) injection  30 mg Subcutaneous Q24H  . ferrous sulfate  325 mg Oral BID  . folic acid  1 mg Oral Daily  . furosemide  40 mg Intravenous BID  . hydrALAZINE  37.5 mg Oral TID  . insulin aspart  0-5 Units Subcutaneous QHS  . insulin aspart  0-9 Units Subcutaneous TID WC  . insulin glargine  35 Units Subcutaneous QHS  . isosorbide mononitrate  30 mg Oral Daily  . levothyroxine  25 mcg Oral QAC breakfast  . LORazepam  0.5-1 mg Intravenous Once  . magnesium oxide  400 mg Oral BID  . senna  1 tablet Oral BID  . simvastatin  20 mg Oral q1800  . sodium bicarbonate  1,300 mg Oral BID  . sodium chloride  3 mL Intravenous Q12H  . sodium polystyrene  30 g Oral BID  . ascorbic acid  500 mg Oral Daily  . zinc sulfate  220 mg Oral Daily  . DISCONTD: clindamycin (CLEOCIN) IV  600 mg Intravenous Q6H  . DISCONTD: levofloxacin (LEVAQUIN) IV  750 mg Intravenous Q48H  . DISCONTD: sodium polystyrene  15 g Oral BID  . DISCONTD: vancomycin  2,000 mg Intravenous Q48H    BMET    Component Value Date/Time   NA 131* 05/23/2012 0415   K 6.1* 05/23/2012 0415   CL 106 05/23/2012 0415   CO2 13* 05/23/2012 0415   GLUCOSE 120* 05/23/2012 0415   BUN 73* 05/23/2012 0415   CREATININE 4.99* 05/23/2012 0415   CREATININE 1.90* 03/07/2012 1652   CALCIUM 8.4 05/23/2012 0415   GFRNONAA 9* 05/23/2012 0415   GFRAA 11* 05/23/2012 0415   CBC    Component Value Date/Time   WBC 16.7* 05/23/2012 0415   WBC 17.8* 06/01/2008 1547   RBC 3.32* 05/23/2012 0415   RBC 4.50 06/01/2008 1547   HGB 8.2* 05/23/2012 0415   HGB 10.1* 06/01/2008 1547   HCT 27.7*  05/23/2012 0415   HCT 33.0* 06/01/2008 1547   PLT 371 05/23/2012 0415   PLT 452* 06/01/2008 1547   MCV 83.4 05/23/2012 0415   MCV 73.2* 06/01/2008 1547   MCH 24.7* 05/23/2012 0415   MCH 22.5* 06/01/2008 1547   MCHC 29.6* 05/23/2012 0415   MCHC 30.7* 06/01/2008 1547   RDW 20.6* 05/23/2012 0415   RDW 18.4* 06/01/2008 1547   LYMPHSABS 1.0 05/21/2012 1351   LYMPHSABS 1.1 06/01/2008  1547   MONOABS 1.0 05/21/2012 1351   MONOABS 0.6 06/01/2008 1547   EOSABS 0.8* 05/21/2012 1351   EOSABS 0.5 06/01/2008 1547   BASOSABS 0.0 05/21/2012 1351   BASOSABS 0.0 06/01/2008 1547   Assessment/Plan:  1. AKI/CKD- multifactorial. Pt seen in consultation at our office on 04/02/12 and was felt to have progressive CKD related to poorly controlled chronic CHF. Found to be profoundly anemic and sent to ED and was admitted for blood transfusion. ANA, complements were negative at that time. Pt now with hydro on Left side and poorly visualized right kidney. Exam difficult due to morbid obesity. ID and urology involved and ordered CT to r/o stone however pt was having SOB/orthopnea and refused CT.  Await further w/u per Urology, however pt is now oliguric with persistent hyperkalemia and worsening met acidosis with nausea this am.  Recommend transfer to [facility] for possible HD vs percutaneous nephrostomy tube or both if needed. 2. Hyperkalemia- not responding to kayexalate/lasix/bicarb.  Will increase dose of lasix and kayexalate.  Give 1 amp of bicarb IV 3. Metabolic acidosis- started po bicarb due to CHF, see above for IV bolus 4. CHF- increase IV lasix and transfer to [facility] for possible HD 5. ABLA vs other process- w/u profound and recurrent anemia 6. Left sided hydro- consult urology as above 7. Obesity 8. Hydradenitis suppurative- agree with ID eval and hold off vanco dose due to AKI 9. DM- per primary svc 10. Hypoalbuminemia- will collect 24hr Uprot, spep/upep 11.   [place] A

## 2012-05-23 NOTE — H&P (View-Only) (Signed)
PROGRESS NOTE  [name] [id number] DOB: June 18, 1961 DOA: 05/21/2012 PCP: [name], MD  Brief narrative: 51 yr old female admitted with shortness of breath Hidradenitis, AKI-states noncompliance with medications  Past medical history: Chronic hidradenitis + leukocytosis-seen at [facility], seen by infectious disease 01/19/12 admission admitted 04/04/12 for symptomatic anemia hemoglobin 6.7 Presumed nonischemic cardiomyopathy last EF 35%-40% 04/06/12 Chronic kidney disease stage III, followed by nephrology baseline creatinine 2.4 Type 2 diabetes mellitus, last A1c 8.5 Acute on chronic systolic congestive heart failure-admitted 01/19/12, Lasix 60 twice a day given. Admit 08/16/11-status post 4 units packed red blood cells History of hypothyroidism, ? MNG Morbid obesity Hyperlipidemia Gout Evaluated 02/08/11 by plastic surgery at [facility]  Consultants:  Urology-Macdiarmid  Renal-Colodanata  ID-Comer  Cardiology-smith  Procedures:  Renal ultrasound 6/25 = diffuse parenchymal thinning and increased parenchyma echotexture consistent medical renal disease bilaterally, poor visualization right kidney, upper pole stone not exclude, new hydronephrosis left kidney, right abdominal ascites  CXR 6/25=Cardiomegally noted again?Asymetric PNa/edema  Antibiotics:  Cefoxitin 6/25  Clindamycin 6/25  Levaquin 6/25  Vancomycin 6/25   Subjective  Patient states she has increasing shortness of breath.  States that she has had difficulty  Breathing while on the table yesterday for CT urogram NO SOB/Cp/Blurred or double vision. Tearful about condition   Objective    Interim History: Chart completely reviewed Wound nurse consult 6/26 Note ID/Renal/Urology input  Subjective:   Objective: Filed Vitals:   05/22/12 0900 05/22/12 1412 05/22/12 2149 05/23/12 0500  BP: 126/82 100/57 102/63 108/64  Pulse: 84 70 70 70  Temp:  97.6 F (36.4 C) 98 F (36.7 C) 98.1 F  (36.7 C)  TempSrc:  Oral Oral Oral  Resp:  22 22 22   Height:      Weight:    167.9 kg (370 lb 2.4 oz)  SpO2:  95% 100% 96%    Intake/Output Summary (Last 24 hours) at 05/23/12 1150 Last data filed at 05/23/12 0600  Gross per 24 hour  Intake    320 ml  Output    105 ml  Net    215 ml    Exam:  General: Obese pleasant African American female Cardiovascular: S1-S2 no murmur rub or gallop Respiratory: Clinically clear Abdomen: Soft nontender, planus noted. There is hidradenitis under abdomen, no overt bleeding but moist and slightly malodorous. Skin is hidradenitis under pannus stomach in intertriginous areas and in intertriginous areas of the folds of the back Neuro is intact  Data Reviewed: Basic Metabolic Panel:  Lab [id number] 0415 05/22/12 1125 05/22/12 0615 05/21/12 1351  NA 131* 131* 132* 133*  K 6.1* 6.1* -- --  CL 106 107 107 107  CO2 13* 13* 14* 12*  GLUCOSE 120* 138* 147* 157*  BUN 73* 66* 66* 66*  CREATININE 4.99* 4.36* 4.22* 4.06*  CALCIUM 8.4 8.5 8.6 8.3*  MG -- -- -- --  PHOS -- -- -- --   Liver Function Tests:  Lab 05/23/12 0415 05/22/12 0615 05/21/12 1351  AST 6 6 5   ALT <5 <5 <5  ALKPHOS 146* 148* 144*  BILITOT 0.3 0.3 0.2*  PROT 8.3 8.6* 7.9  ALBUMIN 2.0* 2.0* 1.9*   No results found for this basename: LIPASE:5,AMYLASE:5 in the last 168 hours No results found for this basename: AMMONIA:5 in the last 168 hours CBC:  Lab 05/23/12 0415 05/22/12 0615 05/21/12 1351  WBC 16.7* 21.5* 20.7*  NEUTROABS -- -- 17.8*  HGB 8.2* 9.0* 7.1*  HCT 27.7* 29.9* 23.8*  MCV  83.4 83.8 81.0  PLT 371 386 299   Cardiac Enzymes:  Lab 05/22/12 1420 05/22/12 0615 05/21/12 1351  CKTOTAL 101 47 --  CKMB 3.5 2.7 --  CKMBINDEX -- -- --  TROPONINI <0.30 <0.30 <0.30   BNP: No components found with this basename: POCBNP:5 CBG:  Lab 05/23/12 0815 05/22/12 2146 05/22/12 1656 05/22/12 1153 05/22/12 0724  GLUCAP 104* 127* 130* 137* 136*    Recent Results (from the  past 240 hour(s))  WOUND CULTURE     Status: Normal (Preliminary result)   Collection Time   05/21/12  9:13 PM      Component Value Range Status Comment   Specimen Description OTHER   Final    Special Requests NONE   Final    Gram Stain     Final    Value: NO WBC SEEN     FEW SQUAMOUS EPITHELIAL CELLS PRESENT     FEW GRAM POSITIVE COCCI IN PAIRS     FEW GRAM NEGATIVE COCCOBACILLI   Culture Culture reincubated for better growth   Final    Report Status PENDING   Incomplete      Studies:              All Imaging reviewed and is as per above notation   Scheduled Meds:    . aspirin EC  81 mg Oral Daily  . carvedilol  6.25 mg Oral BID WC  . DULoxetine  60 mg Oral Daily  . enoxaparin (LOVENOX) injection  30 mg Subcutaneous Q24H  . ferrous sulfate  325 mg Oral BID  . folic acid  1 mg Oral Daily  . furosemide  40 mg Intravenous BID  . hydrALAZINE  37.5 mg Oral TID  . insulin aspart  0-5 Units Subcutaneous QHS  . insulin aspart  0-9 Units Subcutaneous TID WC  . insulin glargine  35 Units Subcutaneous QHS  . isosorbide mononitrate  30 mg Oral Daily  . levothyroxine  25 mcg Oral QAC breakfast  . LORazepam  0.5-1 mg Intravenous Once  . magnesium oxide  400 mg Oral BID  . senna  1 tablet Oral BID  . simvastatin  20 mg Oral q1800  . sodium bicarbonate  1,300 mg Oral BID  . sodium chloride  3 mL Intravenous Q12H  . sodium polystyrene  30 g Oral BID  . ascorbic acid  500 mg Oral Daily  . zinc sulfate  220 mg Oral Daily  . DISCONTD: clindamycin (CLEOCIN) IV  600 mg Intravenous Q6H  . DISCONTD: levofloxacin (LEVAQUIN) IV  750 mg Intravenous Q48H  . DISCONTD: sodium polystyrene  15 g Oral BID  . DISCONTD: vancomycin  2,000 mg Intravenous Q48H   Continuous Infusions:    Assessment/Plan: 1. Acute kidney injury-multifactorial, likely secondary to possible obstructive etiology (pain in left flank), anemia blood loss- toxicity of Lasix and vancomycin less likely etiologies.  Will order a  Fena which would help to determine etiology--Nephrology has seen patient and has ordered further work-up including hepatitis titers, SPEP, ANA, complement levels, antistreptolysin levels, anti-DNA antibodies-her antistreptolysin is elevated to 681, her C4 complement is borderline low.  It was thought that her kidney insufficiency could also issue to a subacute blockage of the left kidney. She has had nephrolithiasis back in 2011. Urology consult for patient and recommended that she undergo percutaneous drain of the left kidney which has been ordered stat. I've also ordered for death it is tender interventional radiology as well today.  She is relatively  oliguric despite being given 1 L of fluids inclusive of blood infusion and will consult nephrology-Lasix was increased 6/27 to 120 mg 3 times a day by nephrology, and she was placed on Kayexalate 15 mg yesterday which is increased to 60 mg once today with followup dose of 30 mg twice a day as she has not passed stool yet-she continues on Bicarbonate infusion for CKD and nephrology has spoken to patient about possibility of the need for Dialysis-I have discussed case with critical care Dr. Nelda Marseille who is aware of the patient should patient decompensate, patient potentially can be transferred to [facility] for further needs inclusive of dialysis onto the step down floor once vascular access for dialysis has been placed as well as perc drain has been placed 2. Symptomatic anemia-likely anemia of chronic disease, status post 2 units packed red blood cells 6/25.  Hemoglobin has been relatively stable.  If her hemoglobin drops to about 7 would recommend transfusion once. 3. 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 She has a low-grade temp of 99.5.  She's been told in the past by her primary infectious disease Dr. Tommy Medal that the antibiotics are stopped working. Appreciate ID input and patient has been  off antibiotics since 6/26. She's been offered surgery in the past at [facility] and care also and ultimately may benefit from plastic surgeon consult.  4. Nonischemic cardiomyopathy last EF 04/06/2012 35-40%-patient has chronic systolic dysfunction, we will continue Lasix for now. We will consult cardiology given she came in with likely florid congestive heart failure which seems to have worsened given her relatively oliguric state. 5. Type 2 diabetes mellitus, last A1c 8.5-Blood sugars between 120-137.  Continue management c SSI 6. Hypothyroid state history of possible multinodular goiter-continue Synthroid  Code Status: Full code Family Communication: Fully discussed with husband and patient at bedside. Disposition Plan: I have consulted nephrology, infectious disease and will consult urology for multiple issues on this patient.   Verneita Griffes, MD  [facility] Pager [phone] 05/23/2012, 11:50 AM    LOS: 2 days

## 2012-05-24 ENCOUNTER — Inpatient Hospital Stay (HOSPITAL_COMMUNITY): Payer: BC Managed Care – PPO

## 2012-05-24 DIAGNOSIS — E8779 Other fluid overload: Secondary | ICD-10-CM

## 2012-05-24 DIAGNOSIS — N179 Acute kidney failure, unspecified: Secondary | ICD-10-CM

## 2012-05-24 DIAGNOSIS — R112 Nausea with vomiting, unspecified: Secondary | ICD-10-CM

## 2012-05-24 DIAGNOSIS — L732 Hidradenitis suppurativa: Secondary | ICD-10-CM

## 2012-05-24 LAB — RENAL FUNCTION PANEL
BUN: 78 mg/dL — ABNORMAL HIGH (ref 6–23)
CO2: 15 mEq/L — ABNORMAL LOW (ref 19–32)
Calcium: 8.1 mg/dL — ABNORMAL LOW (ref 8.4–10.5)
Creatinine, Ser: 5.57 mg/dL — ABNORMAL HIGH (ref 0.50–1.10)
GFR calc Af Amer: 9 mL/min — ABNORMAL LOW (ref >90)
Glucose, Bld: 77 mg/dL (ref 70–99)
Phosphorus: 7.5 mg/dL — ABNORMAL HIGH (ref 2.3–4.6)
Sodium: 137 mEq/L (ref 135–145)

## 2012-05-24 LAB — CBC
HCT: 28.9 % — ABNORMAL LOW (ref 36.0–46.0)
Hemoglobin: 8.7 g/dL — ABNORMAL LOW (ref 12.0–15.0)
MCH: 24.7 pg — ABNORMAL LOW (ref 26.0–34.0)
MCHC: 30.1 g/dL (ref 30.0–36.0)
MCV: 82.1 fL (ref 78.0–100.0)
RDW: 21 % — ABNORMAL HIGH (ref 11.5–15.5)

## 2012-05-24 LAB — UIFE/LIGHT CHAINS/TP QN, 24-HR UR
Albumin, U: DETECTED
Alpha 1, Urine: DETECTED — AB
Alpha 2, Urine: DETECTED — AB
Free Kappa Lt Chains,Ur: 85.9 mg/dL — ABNORMAL HIGH (ref 0.14–2.42)
Free Kappa/Lambda Ratio: 5.17 ratio (ref 2.04–10.37)
Total Protein, Urine: 139.3 mg/dL

## 2012-05-24 LAB — WOUND CULTURE

## 2012-05-24 LAB — PROTEIN ELECTROPHORESIS, SERUM
Alpha-2-Globulin: 14 % — ABNORMAL HIGH (ref 7.1–11.8)
Beta 2: 7.5 % — ABNORMAL HIGH (ref 3.2–6.5)
Beta Globulin: 6 % (ref 4.7–7.2)
M-Spike, %: NOT DETECTED g/dL
Total Protein ELP: 7.8 g/dL (ref 6.0–8.3)

## 2012-05-24 LAB — COMPLEMENT, TOTAL: Compl, Total (CH50): >60 U/mL — ABNORMAL HIGH (ref 31–60)

## 2012-05-24 LAB — GLUCOSE, CAPILLARY: Glucose-Capillary: 100 mg/dL — ABNORMAL HIGH (ref 70–99)

## 2012-05-24 MED ORDER — PHENOL 1.4 % MT LIQD: 1.0000 | OROMUCOSAL | Status: DC | PRN

## 2012-05-24 MED ORDER — HEPARIN SODIUM (PORCINE) 1000 UNIT/ML DIALYSIS
20.0000 [IU]/kg | INTRAMUSCULAR | Status: DC | PRN
  Filled 2012-05-24: qty 4

## 2012-05-24 NOTE — Progress Notes (Signed)
Pt back from dialysis. Wt 164.1kg. Has c/o headache, will med.

## 2012-05-24 NOTE — Progress Notes (Addendum)
Patient ID: [name], female   DOB: 07-Nov-1961, 51 y.o.   MRN: [id number]   Pt was scheduled for left PCN and tunnelled HD cath 6/27  Dr Vernard Gambles was able to place a temporary Trialysis catheter 6/27 pm (see note)  Pt unable to tolerate L PCN placement Unable to lay down without tremendous SOB He was not able to perform procedure  We will continue to check on pt I saw pt today; still very SOB and unable to lay flat  If MD will please let us know when you feel like we can safely move forward with L PCN Thank you

## 2012-05-24 NOTE — Progress Notes (Signed)
PT Cancellation Note  Treatment cancelled today due to patient receiving procedure or test. Pt currently in dialysis. Will attempt evaluation tomorrow pending medical stability and availability.  Thanks, 05/24/2012 Ambrose Finland DPT PAGER: [phone] OFFICE: [phone]    Ambrose Finland 05/24/2012, 3:25 PM

## 2012-05-24 NOTE — Procedures (Signed)
Patient was seen on dialysis and the procedure was supervised. BFR 200 Via [facility] non-tunneled catheter BP is 115/73.  Patient appears to be tolerating treatment well

## 2012-05-24 NOTE — Progress Notes (Signed)
[facility] [place] TEAM 1 - Stepdown/ICU TEAM  PCP:  [name], MD  Subjective: 51yo w/ a complex past medical history who presented with recurrent shortness of breath worse with exertion. She had similar complaints in May when she was hospitalized. She stated that she had not been taking her meds routinely since discharge as she was preoccupied with a project she had been working on.  In Ed she was found to have acute on chronic anemia with hemoglobin of 7.1g/dl from a baseline of 9.8 a month ago.  She also had a bump in creatinine from a baseline of 2.4 to 4.06.  Pt was transferred to [facility] on 6/27 to undergo perc nephrostomy as well as placement of a tunneled HD cath.  She was unable to tolerate either procedure due to SOB w/ horizontal positioning, therefore a L IJ HD cath was placed.    She is seen in her room post HD today.  She reports an improvement in her SOB, but states that she still does not feel that she can lay flat.  She c/o some soreness of the throat.  She denies f/c, n/v, or abdom pain at this time.    Objective:  Intake/Output Summary (Last 24 hours) at 05/24/12 1700 Last data filed at 05/24/12 1635  Gross per 24 hour  Intake      3 ml  Output   2816 ml  Net  -2813 ml   Blood pressure 116/56, pulse 85, temperature 97.3 F (36.3 C), temperature source Oral, resp. rate 18, height 5\' 6"  (1.676 m), weight 167.9 kg (370 lb 2.4 oz), SpO2 95.00%.  CBG (last 3)   Basename 05/24/12 1307 05/24/12 0812 05/23/12 2153  GLUCAP 100* 84 125*   Physical Exam: General: No acute respiratory distress at rest while at 30degress Lungs: distant BS th/o - bibasilar crackles - no wheeze  Cardiovascular:  distant HS - regular rate and rhythm without murmur gallop or rub Abdomen: obese, nontender, nondistended, soft, bowel sounds positive, no rebound, no ascites, no appreciable mass Extremities: 1+B LE edema w/o cyanosis or clubbing   Lab Results:  Basename 05/24/12 0855  05/23/12 0415 05/22/12 1125  NA 137 131* 131*  K 4.8 6.1* 6.1*  CL 106 106 107  CO2 15* 13* 13*  GLUCOSE 77 120* 138*  BUN 78* 73* 66*  CREATININE 5.57* 4.99* 4.36*  CALCIUM 8.1* 8.4 8.5  MG -- -- --  PHOS 7.5* -- --    Basename 05/24/12 0855 05/23/12 0415 05/22/12 0615  AST -- 6 6  ALT -- <5 <5  ALKPHOS -- 146* 148*  BILITOT -- 0.3 0.3  PROT -- 8.3 8.6*  ALBUMIN 1.9* 2.0* --    Basename 05/24/12 0855 05/23/12 0415 05/22/12 0615  WBC 15.9* 16.7* 21.5*  NEUTROABS -- -- --  HGB 8.7* 8.2* 9.0*  HCT 28.9* 27.7* 29.9*  MCV 82.1 83.4 83.8  PLT 381 371 386    Basename 05/22/12 1420 05/22/12 0615  CKTOTAL 101 47  CKMB 3.5 2.7  CKMBINDEX -- --  TROPONINI <0.30 <0.30   Micro Results: Recent Results (from the past 240 hour(s))  WOUND CULTURE     Status: Normal   Collection Time   05/21/12  9:13 PM      Component Value Range Status Comment   Specimen Description OTHER   Final    Special Requests NONE   Final    Gram Stain     Final    Value: NO WBC SEEN  FEW SQUAMOUS EPITHELIAL CELLS PRESENT     FEW GRAM POSITIVE COCCI IN PAIRS     FEW GRAM NEGATIVE COCCOBACILLI   Culture     Final    Value: MODERATE GROUP B STREP(S.AGALACTIAE)ISOLATED     Note: TESTING AGAINST S. AGALACTIAE NOT ROUTINELY PERFORMED DUE TO PREDICTABILITY OF AMP/PEN/VAN SUSCEPTIBILITY.   Report Status 05/24/2012 FINAL   Final   URINE CULTURE     Status: Normal   Collection Time   05/22/12  4:52 PM      Component Value Range Status Comment   Specimen Description URINE, CATHETERIZED   Final    Special Requests NONE   Final    Culture  Setup Time [id number]   Final    Colony Count NO GROWTH   Final    Culture NO GROWTH   Final    Report Status 05/23/2012 FINAL   Final   MRSA PCR SCREENING     Status: Normal   Collection Time   05/23/12  6:07 PM      Component Value Range Status Comment   MRSA by PCR NEGATIVE  NEGATIVE Final     Studies/Results: All recent x-ray/radiology reports have been  reviewed in detail.   Medications: I have reviewed the patient's complete medication list.  Assessment/Plan:  Severe anemia in setting of chronic anemia S/p 2U PRBC on 6/25 - Hgb "improving" with volume removal - no evidence of acute blood loss - follow  hydronephrosis Has been evaluated by Urology - plan is to undergo CT guided per neph once pt able to tolerate laying flat  Acute on Chronic kidney disease stage III, followed by nephrology baseline creatinine 2.4 HD has now been initiated - tolerating well thus far   Hyperkalemia Due to above - being managed w/ HD  Metabolic acidosis Due to above - being managed w/ HD  Volume overload Due to above - being managed w/ HD  nonischemic cardiomyopathy last EF 35%-40% 04/06/12 Appears to be well compensated at this time - volume overload felt to be renal in origin and not a reflection of decompensated CHF  hydradenitis suprativa of the groin Followed at [facility] and in [facility] - seen by ID this admit who state their is no role for abx tx  DM2 Reasonably controlled at present - follow trend  Hypothyroidism  Morbid obesity  Gout  medical non-compliance   Cherene Altes, MD [facility] Office  [phone] Pager [phone]  On-Call/Text Page:      [email or web address]      password [facility]

## 2012-05-24 NOTE — Consult Note (Signed)
WOC consult Note Bedside nurse Bryson Dames assessed patient and noted moisture acquired skin damage to pannus skin folds.  Order placed for Interdry to provide antimicrobial benefits and absorb moisture.  This will be discontinued after 5 days. Will not plan to follow further unless re-consulted.  [address], [place], MSN, [place]

## 2012-05-24 NOTE — Progress Notes (Signed)
Patient ID: [name], female   DOB: [birth year], 51 y.o.   MRN: [id number] S:feels a little better today after BM O:BP 104/63  Pulse 85  Temp 97.6 F (36.4 C) (Oral)  Resp 36  Ht 5\' 6"  (1.676 m)  Wt 167.9 kg (370 lb 2.4 oz)  BMI 59.74 kg/m2  SpO2 93%  Intake/Output Summary (Last 24 hours) at 05/24/12 1050 Last data filed at 05/24/12 1048  Gross per 24 hour  Intake      3 ml  Output    700 ml  Net   -697 ml   Weight change: 0 kg (0 lb) Gen:WD obese AAF in NAD CVS:RRR no rub Resp:decreased BS at bases [id number] obese/distended, mildly tender Ext:1+edema   Lab 05/24/12 0855 05/23/12 0415 05/22/12 1125 05/22/12 0615 05/21/12 1351  NA 137 131* 131* 132* 133*  K 4.8 6.1* 6.1* 6.0* 5.2*  CL 106 106 107 107 107  CO2 15* 13* 13* 14* 12*  GLUCOSE 77 120* 138* 147* 157*  BUN 78* 73* 66* 66* 66*  CREATININE 5.57* 4.99* 4.36* 4.22* 4.06*  ALBUMIN 1.9* 2.0* -- 2.0* 1.9*  CALCIUM 8.1* 8.4 8.5 8.6 8.3*  PHOS 7.5* -- -- -- --  AST -- 6 -- 6 5  ALT -- <5 -- <5 <5   Liver Function Tests:  Lab 05/24/12 0855 05/23/12 0415 05/22/12 0615 05/21/12 1351  AST -- 6 6 5   ALT -- <5 <5 <5  ALKPHOS -- 146* 148* 144*  BILITOT -- 0.3 0.3 0.2*  PROT -- 8.3 8.6* 7.9  ALBUMIN 1.9* 2.0* 2.0* --   No results found for this basename: LIPASE:3,AMYLASE:3 in the last 168 hours No results found for this basename: AMMONIA:3 in the last 168 hours CBC:  Lab 05/24/12 0855 05/23/12 0415 05/22/12 0615 05/21/12 1351  WBC 15.9* 16.7* 21.5* --  NEUTROABS -- -- -- 17.8*  HGB 8.7* 8.2* 9.0* --  HCT 28.9* 27.7* 29.9* --  MCV 82.1 83.4 83.8 81.0  PLT 381 371 386 --   Cardiac Enzymes:  Lab 05/22/12 1420 05/22/12 0615 05/21/12 1351  CKTOTAL 101 47 --  CKMB 3.5 2.7 --  CKMBINDEX -- -- --  TROPONINI <0.30 <0.30 <0.30   CBG:  Lab 05/24/12 0812 05/23/12 2153 05/23/12 1802 05/23/12 1602 05/23/12 1135  GLUCAP 84 125* 117* 120* 133*    Iron Studies: No results found for this basename:  IRON,TIBC,TRANSFERRIN,FERRITIN in the last 72 hours Studies/Results: Dg Chest 1 View  05/23/2012  *RADIOLOGY REPORT*  Clinical Data: Diatek catheter placement  CHEST - 1 VIEW  Comparison: Portable exam 1727 hours compared to 05/21/2012  Findings: New left jugular large bore dual lumen central venous catheter. Tip is not well visualized due to body habitus, question projecting over the right atrium. No pneumothorax. Significant enlargement of cardiac silhouette with pulmonary vascular congestion. Low lung volumes. No gross infiltrate or effusion. Osseous mineralization normal.  IMPRESSION: No pneumothorax following Diatek catheter insertion. The tip of the catheter is suboptimally visualized due to body habitus and underpenetration, question projecting over the right atrium; if confirmation of the position of the catheter is required, recommend repeat imaging.  Original Report Authenticated By: Burnetta Sabin, M.D.      . aspirin EC  81 mg Oral Daily  . carvedilol  6.25 mg Oral BID WC  . enoxaparin (LOVENOX) injection  30 mg Subcutaneous Q24H  . ferrous sulfate  325 mg Oral BID  . folic acid  1 mg Oral Daily  . furosemide  120 mg Intravenous TID  . hydrALAZINE  37.5 mg Oral TID  . insulin aspart  0-5 Units Subcutaneous QHS  . insulin aspart  0-9 Units Subcutaneous TID WC  . insulin glargine  35 Units Subcutaneous QHS  . isosorbide mononitrate  30 mg Oral Daily  . levothyroxine  25 mcg Oral QAC breakfast  . LORazepam  0.5-1 mg Intravenous Once  . magnesium oxide  400 mg Oral BID  . senna  1 tablet Oral BID  . simvastatin  20 mg Oral q1800  . sodium bicarbonate  50 mEq Intravenous Once  . sodium bicarbonate  1,300 mg Oral BID  . sodium chloride  3 mL Intravenous Q12H  . sodium polystyrene  30 g Oral BID  . sodium polystyrene  60 g Oral Once  . ascorbic acid  500 mg Oral Daily  . zinc sulfate  220 mg Oral Daily  . DISCONTD: ciprofloxacin  400 mg Intravenous 30 min Pre-Op  . DISCONTD:  DULoxetine  60 mg Oral Daily  . DISCONTD: furosemide  40 mg Intravenous BID    BMET    Component Value Date/Time   NA 137 05/24/2012 0855   K 4.8 05/24/2012 0855   CL 106 05/24/2012 0855   CO2 15* 05/24/2012 0855   GLUCOSE 77 05/24/2012 0855   BUN 78* 05/24/2012 0855   CREATININE 5.57* 05/24/2012 0855   CREATININE 1.90* 03/07/2012 1652   CALCIUM 8.1* 05/24/2012 0855   GFRNONAA 8* 05/24/2012 0855   GFRAA 9* 05/24/2012 0855   CBC    Component Value Date/Time   WBC 15.9* 05/24/2012 0855   WBC 17.8* 06/01/2008 1547   RBC 3.52* 05/24/2012 0855   RBC 4.50 06/01/2008 1547   HGB 8.7* 05/24/2012 0855   HGB 10.1* 06/01/2008 1547   HCT 28.9* 05/24/2012 0855   HCT 33.0* 06/01/2008 1547   PLT 381 05/24/2012 0855   PLT 452* 06/01/2008 1547   MCV 82.1 05/24/2012 0855   MCV 73.2* 06/01/2008 1547   MCH 24.7* 05/24/2012 0855   MCH 22.5* 06/01/2008 1547   MCHC 30.1 05/24/2012 0855   MCHC 30.7* 06/01/2008 1547   RDW 21.0* 05/24/2012 0855   RDW 18.4* 06/01/2008 1547   LYMPHSABS 1.0 05/21/2012 1351   LYMPHSABS 1.1 06/01/2008 1547   MONOABS 1.0 05/21/2012 1351   MONOABS 0.6 06/01/2008 1547   EOSABS 0.8* 05/21/2012 1351   EOSABS 0.5 06/01/2008 1547   BASOSABS 0.0 05/21/2012 1351   BASOSABS 0.0 06/01/2008 1547   Assessment/Plan:  1. AKI/CKD- multifactorial. Pt seen in consultation at our office on 04/02/12 and was felt to have progressive CKD related to poorly controlled chronic CHF. Found to be profoundly anemic and sent to ED and was admitted for blood transfusion. ANA, complements were negative at that time. Pt now with hydro on Left side and poorly visualized right kidney. Exam difficult due to morbid obesity. ID and urology involved and ordered CT to r/o stone however pt was having SOB/orthopnea and refused CT. Await further w/u per Urology, however pt is now oliguric with persistent hyperkalemia and worsening met acidosis with nausea this am.  Worsening renal function with azotemia and nausea.  Plan for HD today for UF as pt remains  oliguric and volume overloaded.  Hopefully her cardiac function will improve with better volume management (better half of Starling curve)  2. Hyperkalemia- improved with kayexalate but plan on HD. Will d/c standing dose 3. Metabolic acidosis- started po bicarb due to CHF, plan for HD 4. CHF-  no response to increase IV lasix dose.  Plan for UF with HD today. 5. ABLA vs other process- w/u profound and recurrent anemia 6. Left sided hydro- couldn't tolerate positioning for PNT.  Plan for HD with UF today and hopefully she can then lie down for CT to r/o stone +/- nephrostomy tube 7. Obesity 8. Hydradenitis suppurative- agree with ID eval and hold off vanco dose due to AKI 9. DM- per primary svc 10. Hypoalbuminemia- will collect 24hr Uprot, spep/upep 11. F/E/N- resume diabetic diet Everton Bertha A

## 2012-05-24 NOTE — Progress Notes (Signed)
Reviewed chart Poor surgical candidate Spoke with Dr Arty Baumgartner Agree with plan to dialyse hoping CT can be done Will f/up over weekend- ? Role of stent vs. Perc

## 2012-05-24 NOTE — Progress Notes (Signed)
Pt in route to Dialysis, no complaints

## 2012-05-25 ENCOUNTER — Inpatient Hospital Stay (HOSPITAL_COMMUNITY): Payer: BC Managed Care – PPO

## 2012-05-25 DIAGNOSIS — N179 Acute kidney failure, unspecified: Secondary | ICD-10-CM

## 2012-05-25 DIAGNOSIS — E8779 Other fluid overload: Secondary | ICD-10-CM

## 2012-05-25 DIAGNOSIS — L732 Hidradenitis suppurativa: Secondary | ICD-10-CM

## 2012-05-25 DIAGNOSIS — R112 Nausea with vomiting, unspecified: Secondary | ICD-10-CM

## 2012-05-25 LAB — GLUCOSE, CAPILLARY
Glucose-Capillary: 86 mg/dL (ref 70–99)
Glucose-Capillary: 100 mg/dL — ABNORMAL HIGH (ref 70–99)

## 2012-05-25 LAB — CBC
HCT: 26.4 % — ABNORMAL LOW (ref 36.0–46.0)
MCH: 24.5 pg — ABNORMAL LOW (ref 26.0–34.0)
MCHC: 30.3 g/dL (ref 30.0–36.0)
MCV: 80.7 fL (ref 78.0–100.0)
Platelets: 332 10*3/uL (ref 150–400)
RDW: 20.7 % — ABNORMAL HIGH (ref 11.5–15.5)
WBC: 17.9 10*3/uL — ABNORMAL HIGH (ref 4.0–10.5)

## 2012-05-25 LAB — RENAL FUNCTION PANEL
Albumin: 1.7 g/dL — ABNORMAL LOW (ref 3.5–5.2)
BUN: 69 mg/dL — ABNORMAL HIGH (ref 6–23)
Calcium: 7.5 mg/dL — ABNORMAL LOW (ref 8.4–10.5)
Glucose, Bld: 111 mg/dL — ABNORMAL HIGH (ref 70–99)
Phosphorus: 6.6 mg/dL — ABNORMAL HIGH (ref 2.3–4.6)
Potassium: 4.1 mEq/L (ref 3.5–5.1)

## 2012-05-25 MED ORDER — DULOXETINE HCL 60 MG PO CPEP
60.0000 mg | ORAL_CAPSULE | Freq: Every day | ORAL | Status: DC
  Administered 2012-05-25 – 2012-06-04 (×11): 60 mg via ORAL
  Filled 2012-05-25 (×12): qty 1

## 2012-05-25 NOTE — Progress Notes (Signed)
Patient ID: [name], female   DOB: 03/10/1961, 51 y.o.   MRN: [id number] S:breathing is better O:BP 134/57  Pulse 75  Temp 98 F (36.7 C) (Oral)  Resp 18  Ht 5\' 6"  (1.676 m)  Wt 164.1 kg (361 lb 12.4 oz)  BMI 58.39 kg/m2  SpO2 96%  Intake/Output Summary (Last 24 hours) at 05/25/12 1132 Last data filed at 05/25/12 0735  Gross per 24 hour  Intake    302 ml  Output   3091 ml  Net  -2789 ml   Weight change: 4.9 kg (10 lb 12.8 oz) [id number] AAF in NAD CVS:rrr Resp:CTA with decreased BS at bases [id number], NBS, soft [id number] edema   Lab 05/25/12 0500 05/24/12 0855 05/23/12 0415 05/22/12 1125 05/22/12 0615 05/21/12 1351  NA 138 137 131* 131* 132* 133*  K 4.1 4.8 6.1* 6.1* 6.0* 5.2*  CL 104 106 106 107 107 107  CO2 18* 15* 13* 13* 14* 12*  GLUCOSE 111* 77 120* 138* 147* 157*  BUN 69* 78* 73* 66* 66* 66*  CREATININE 5.14* 5.57* 4.99* 4.36* 4.22* 4.06*  ALBUMIN 1.7* 1.9* 2.0* -- 2.0* 1.9*  CALCIUM 7.5* 8.1* 8.4 8.5 8.6 8.3*  PHOS 6.6* 7.5* -- -- -- --  AST -- -- 6 -- 6 5  ALT -- -- <5 -- <5 <5   Liver Function Tests:  Lab 05/25/12 0500 05/24/12 0855 05/23/12 0415 05/22/12 0615 05/21/12 1351  AST -- -- 6 6 5   ALT -- -- <5 <5 <5  ALKPHOS -- -- 146* 148* 144*  BILITOT -- -- 0.3 0.3 0.2*  PROT -- -- 8.3 8.6* 7.9  ALBUMIN 1.7* 1.9* 2.0* -- --   No results found for this basename: LIPASE:3,AMYLASE:3 in the last 168 hours No results found for this basename: AMMONIA:3 in the last 168 hours CBC:  Lab 05/25/12 0500 05/24/12 0855 05/23/12 0415 05/22/12 0615 05/21/12 1351  WBC 17.9* 15.9* 16.7* -- --  NEUTROABS -- -- -- -- 17.8*  HGB 8.0* 8.7* 8.2* -- --  HCT 26.4* 28.9* 27.7* -- --  MCV 80.7 82.1 83.4 83.8 81.0  PLT 332 381 371 -- --   Cardiac Enzymes:  Lab 05/22/12 1420 05/22/12 0615 05/21/12 1351  CKTOTAL 101 47 --  CKMB 3.5 2.7 --  CKMBINDEX -- -- --  TROPONINI <0.30 <0.30 <0.30   CBG:  Lab 05/25/12 0748 05/24/12 2142 05/24/12 1742 05/24/12 1307 05/24/12  0812  GLUCAP 86 158* 124* 100* 84    Iron Studies: No results found for this basename: IRON,TIBC,TRANSFERRIN,FERRITIN in the last 72 hours Studies/Results: Dg Chest 1 View  05/23/2012  *RADIOLOGY REPORT*  Clinical Data: Diatek catheter placement  CHEST - 1 VIEW  Comparison: Portable exam 1727 hours compared to 05/21/2012  Findings: New left jugular large bore dual lumen central venous catheter. Tip is not well visualized due to body habitus, question projecting over the right atrium. No pneumothorax. Significant enlargement of cardiac silhouette with pulmonary vascular congestion. Low lung volumes. No gross infiltrate or effusion. Osseous mineralization normal.  IMPRESSION: No pneumothorax following Diatek catheter insertion. The tip of the catheter is suboptimally visualized due to body habitus and underpenetration, question projecting over the right atrium; if confirmation of the position of the catheter is required, recommend repeat imaging.  Original Report Authenticated By: Burnetta Sabin, M.D.      . aspirin EC  81 mg Oral Daily  . carvedilol  6.25 mg Oral BID WC  . enoxaparin (LOVENOX) injection  30 mg  Subcutaneous Q24H  . ferrous sulfate  325 mg Oral BID  . folic acid  1 mg Oral Daily  . furosemide  120 mg Intravenous TID  . hydrALAZINE  37.5 mg Oral TID  . insulin aspart  0-5 Units Subcutaneous QHS  . insulin aspart  0-9 Units Subcutaneous TID WC  . insulin glargine  35 Units Subcutaneous QHS  . isosorbide mononitrate  30 mg Oral Daily  . levothyroxine  25 mcg Oral QAC breakfast  . LORazepam  0.5-1 mg Intravenous Once  . senna  1 tablet Oral BID  . simvastatin  20 mg Oral q1800  . sodium bicarbonate  1,300 mg Oral BID  . sodium chloride  3 mL Intravenous Q12H  . ascorbic acid  500 mg Oral Daily  . zinc sulfate  220 mg Oral Daily    BMET    Component Value Date/Time   NA 138 05/25/2012 0500   K 4.1 05/25/2012 0500   CL 104 05/25/2012 0500   CO2 18* 05/25/2012 0500   GLUCOSE  111* 05/25/2012 0500   BUN 69* 05/25/2012 0500   CREATININE 5.14* 05/25/2012 0500   CREATININE 1.90* 03/07/2012 1652   CALCIUM 7.5* 05/25/2012 0500   GFRNONAA 9* 05/25/2012 0500   GFRAA 10* 05/25/2012 0500   CBC    Component Value Date/Time   WBC 17.9* 05/25/2012 0500   WBC 17.8* 06/01/2008 1547   RBC 3.27* 05/25/2012 0500   RBC 4.50 06/01/2008 1547   HGB 8.0* 05/25/2012 0500   HGB 10.1* 06/01/2008 1547   HCT 26.4* 05/25/2012 0500   HCT 33.0* 06/01/2008 1547   PLT 332 05/25/2012 0500   PLT 452* 06/01/2008 1547   MCV 80.7 05/25/2012 0500   MCV 73.2* 06/01/2008 1547   MCH 24.5* 05/25/2012 0500   MCH 22.5* 06/01/2008 1547   MCHC 30.3 05/25/2012 0500   MCHC 30.7* 06/01/2008 1547   RDW 20.7* 05/25/2012 0500   RDW 18.4* 06/01/2008 1547   LYMPHSABS 1.0 05/21/2012 1351   LYMPHSABS 1.1 06/01/2008 1547   MONOABS 1.0 05/21/2012 1351   MONOABS 0.6 06/01/2008 1547   EOSABS 0.8* 05/21/2012 1351   EOSABS 0.5 06/01/2008 1547   BASOSABS 0.0 05/21/2012 1351   BASOSABS 0.0 06/01/2008 1547   Assessment/Plan:  1. AKI/CKD- multifactorial.  progressive CKD related to poorly controlled chronic CHF. Found to be profoundly anemic. Pt now with hydro on Left sided and poorly visualized right kidney. Exam difficult due to morbid obesity. ID and urology involved and ordered CT to r/o stone however pt was having SOB/orthopnea and refused CT.Now that she is s/p HD will try again today. Hold off on HD today and follow UOP and daily Scr. Hopefully her cardiac function will improve with better volume management (better half of Starling curve)  2. Hyperkalemia- improved 3. Metabolic acidosis- started po bicarb due to CHF, improved with HD 4. CHF- slight response to increase IV lasix dose. Follow off HD  5. ABLA vs other process- w/u profound and recurrent anemia 6. Left sided hydro- couldn't tolerate positioning for PNT. Plan for CT scan today and hopefully she can then lie down for CT to r/o stone +/- nephrostomy tube 7. Obesity 8. Hydradenitis  suppurative- agree with ID eval and hold off vanco dose due to AKI and will likely need surgical drainage. 9. DM- per primary svc 10. Hypoalbuminemia- will collect 24hr Uprot, spep/upep 11. F/E/N- resume diabetic diet 12. Dispo- may need CIR Elisabeth Strom A

## 2012-05-25 NOTE — Progress Notes (Signed)
InterDry dressings applied under bilateral breast and bilateral groin areas, under right arm pit and small place in left back skin fold.  Patient tolerated well, will order more dressing supplies and continue to monitor.

## 2012-05-25 NOTE — Progress Notes (Signed)
SUBJECTIVE:  States SOB has improved  OBJECTIVE:   Vitals:   Filed Vitals:   05/25/12 0000 05/25/12 0015 05/25/12 0400 05/25/12 0735  BP: 97/38 110/49 134/57   Pulse: 80  75   Temp: 98.7 F (37.1 C)  98 F (36.7 C) 98 F (36.7 C)  TempSrc: Oral  Oral Oral  Resp: 28 35 18   Height:      Weight:   164.1 kg (361 lb 12.4 oz)   SpO2: 95%  96%    I&O's:   Intake/Output Summary (Last 24 hours) at 05/25/12 0951 Last data filed at 05/25/12 0735  Gross per 24 hour  Intake    305 ml  Output   3091 ml  Net  -2786 ml   TELEMETRY: Reviewed telemetry pt in NSR     PHYSICAL EXAM General: Well developed, well nourished, in no acute distress Lungs:   Clear bilaterally to auscultation and percussion. Heart:   HRRR S1 S2 Pulses are 2+ & equal. Abdomen: Bowel sounds are positive, abdomen soft and non-tender without masses  Extremities:   No clubbing, cyanosis or edema.  DP +1 Neuro: Alert and oriented X 3. Psych:  Good affect, responds appropriately   LABS: Basic Metabolic Panel:  Basename 05/25/12 0500 05/24/12 0855  NA 138 137  K 4.1 4.8  CL 104 106  CO2 18* 15*  GLUCOSE 111* 77  BUN 69* 78*  CREATININE 5.14* 5.57*  CALCIUM 7.5* 8.1*  MG -- --  PHOS 6.6* 7.5*   Liver Function Tests:  Basename 05/25/12 0500 05/24/12 0855 05/23/12 0415  AST -- -- 6  ALT -- -- <5  ALKPHOS -- -- 146*  BILITOT -- -- 0.3  PROT -- -- 8.3  ALBUMIN 1.7* 1.9* --   No results found for this basename: LIPASE:2,AMYLASE:2 in the last 72 hours CBC:  Basename 05/25/12 0500 05/24/12 0855  WBC 17.9* 15.9*  NEUTROABS -- --  HGB 8.0* 8.7*  HCT 26.4* 28.9*  MCV 80.7 82.1  PLT 332 381   Cardiac Enzymes:  Basename 05/22/12 1420  CKTOTAL 101  CKMB 3.5  CKMBINDEX --  TROPONINI <0.30    Lab Results  Component Value Date   INR 1.30 05/22/2012   INR 1.40 10/08/202013   INR 1.43 01/29/2012    RADIOLOGY: Dg Chest 1 View  05/23/2012  *RADIOLOGY REPORT*  Clinical Data: Diatek catheter placement   CHEST - 1 VIEW  Comparison: Portable exam 1727 hours compared to 05/21/2012  Findings: New left jugular large bore dual lumen central venous catheter. Tip is not well visualized due to body habitus, question projecting over the right atrium. No pneumothorax. Significant enlargement of cardiac silhouette with pulmonary vascular congestion. Low lung volumes. No gross infiltrate or effusion. Osseous mineralization normal.  IMPRESSION: No pneumothorax following Diatek catheter insertion. The tip of the catheter is suboptimally visualized due to body habitus and underpenetration, question projecting over the right atrium; if confirmation of the position of the catheter is required, recommend repeat imaging.  Original Report Authenticated By: Burnetta Sabin, M.D.   US Renal  05/21/2012  *RADIOLOGY REPORT*  Clinical Data: Chronic renal disease with acute renal failure.  RENAL/URINARY TRACT ULTRASOUND COMPLETE  Comparison:  11/11/2010  Findings:  Right Kidney:  Visualization of the right kidney is somewhat limited.  Right renal length is measured at 13 cm.  There appears to be thinning of the parenchymal cortex with increased echogenic appearance of the parenchyma consistent with chronic medical renal disease.  There  is a vague hyperechoic focus in the upper pole measuring 2.2 cm but is not well visualized.  A stone is not excluded.  No hydronephrosis.  Left Kidney:  The left kidney measures 15.4 cm length.  There is diffuse thinning and increased echotexture throughout the parenchyma consistent with chronic medical renal disease.  There is moderate pyelocaliectasis.  Urinary obstruction is suggested.  This is new since the previous study.  Bladder:  The bladder is decompressed with Foley catheter and is not visualized.  Incidental note of free fluid in the right upper quadrant and right lower quadrant consistent with ascites.  IMPRESSION: Diffuse parenchymal thinning and increased parenchymal echotexture consistent with  medical renal disease bilaterally.  Poor visualization of the right kidney.  Upper pole stone is not excluded.  New hydronephrosis of the left kidney.  Right abdominal ascites.  Original Report Authenticated By: Neale Burly, M.D.   Dg Chest Port 1 View  05/21/2012  *RADIOLOGY REPORT*  Clinical Data: Rule out pneumonia, cough, congestion, shortness of breath  PORTABLE CHEST - 1 VIEW  Comparison: 04/04/2012  Findings: Study is limited by patient's large body habitus. Cardiomegaly again noted.  There is asymmetric hazy airspace disease left lung and right base.  The findings may be due to asymmetric edema or pneumonia.  Clinical correlation is necessary. Follow-up two-views chest is recommended.  IMPRESSION: .  Cardiomegaly again noted.  There is asymmetric hazy airspace disease left lung and right base.  The findings may be due to asymmetric edema or pneumonia.  Clinical correlation is necessary. Follow-up two-views chest is recommended.  Original Report Authenticated By: Lahoma Crocker, M.D.      ASSESSMENT:  1.  Acute on chronic systolic CHF secondary to volume overload from kidney and urologic dysfunction and medical noncompliance with meds 2.  Moderate LV dysfunction EF 35-40% 3.  Morbid obesity 4.  DM 5.  Chronic hidradenitis 6.  CKD stage 4 - now on HD 7.  Severe anemia in setting of chronic anemia s/p PRBC transfusion 8.  Hydronephrosis   PLAN:   1.  Continue beta blocker/hydralazine/nitrates for treatment of cardiomyopathy  Sueanne Margarita, MD  05/25/2012  9:51 AM

## 2012-05-25 NOTE — Progress Notes (Signed)
PT Cancellation Note  Treatment cancelled today due to patient's refusal to participate. Will attempt evaluation tomorrow pending availability.  Thanks, 05/25/2012 Ambrose Finland DPT PAGER: [phone] OFFICE: [phone]    Ambrose Finland 05/25/2012, 2:32 PM

## 2012-05-25 NOTE — Progress Notes (Signed)
[facility] [place] TEAM 1 - Stepdown/ICU TEAM  PCP:  [name], MD  Subjective: 51yo w/ a complex past medical history who presented with recurrent shortness of breath worse with exertion. She had similar complaints in May when she was hospitalized. She stated that she had not been taking her meds routinely since discharge as she was preoccupied with a project she had been working on.  In Ed she was found to have acute on chronic anemia with hemoglobin of 7.1g/dl from a baseline of 9.8 a month ago.  She also had a bump in creatinine from a baseline of 2.4 to 4.06.  Pt was transferred to [facility] on 6/27 to undergo perc nephrostomy as well as placement of a tunneled HD cath.  She was unable to tolerate either procedure due to SOB w/ horizontal positioning, therefore a L IJ HD cath was placed.    The pt reports that she feels less sob today, and is now able to lay flat.  She denies n/v, abdom pain, cp, or ha.    Objective:  Intake/Output Summary (Last 24 hours) at 05/25/12 1806 Last data filed at 05/25/12 1700  Gross per 24 hour  Intake    976 ml  Output   1350 ml  Net   -374 ml   Blood pressure 140/72, pulse 78, temperature 97.5 F (36.4 C), temperature source Oral, resp. rate 24, height 5\' 6"  (1.676 m), weight 164.1 kg (361 lb 12.4 oz), SpO2 98.00%.  CBG (last 3)   Basename 05/25/12 1742 05/25/12 1221 05/25/12 0748  GLUCAP 114* 100* 86   Physical Exam: General: No acute respiratory distress at rest while at 30degress Lungs: distant BS th/o - bibasilar crackles - no wheeze  Cardiovascular:  distant HS - regular rate and rhythm without murmur gallop or rub Abdomen: obese, nontender, nondistended, soft, bowel sounds positive, no rebound, no ascites, no appreciable mass Extremities: trace B LE edema w/o cyanosis or clubbing   Lab Results:  Basename 05/25/12 0500 05/24/12 0855 05/23/12 0415  NA 138 137 131*  K 4.1 4.8 6.1*  CL 104 106 106  CO2 18* 15* 13*  GLUCOSE  111* 77 120*  BUN 69* 78* 73*  CREATININE 5.14* 5.57* 4.99*  CALCIUM 7.5* 8.1* 8.4  MG -- -- --  PHOS 6.6* 7.5* --    Basename 05/25/12 0500 05/24/12 0855 05/23/12 0415  AST -- -- 6  ALT -- -- <5  ALKPHOS -- -- 146*  BILITOT -- -- 0.3  PROT -- -- 8.3  ALBUMIN 1.7* 1.9* --    Basename 05/25/12 0500 05/24/12 0855 05/23/12 0415  WBC 17.9* 15.9* 16.7*  NEUTROABS -- -- --  HGB 8.0* 8.7* 8.2*  HCT 26.4* 28.9* 27.7*  MCV 80.7 82.1 83.4  PLT 332 381 371   Micro Results: Recent Results (from the past 240 hour(s))  WOUND CULTURE     Status: Normal   Collection Time   05/21/12  9:13 PM      Component Value Range Status Comment   Specimen Description OTHER   Final    Special Requests NONE   Final    Gram Stain     Final    Value: NO WBC SEEN     FEW SQUAMOUS EPITHELIAL CELLS PRESENT     FEW GRAM POSITIVE COCCI IN PAIRS     FEW GRAM NEGATIVE COCCOBACILLI   Culture     Final    Value: MODERATE GROUP B STREP(S.AGALACTIAE)ISOLATED     Note:  TESTING AGAINST S. AGALACTIAE NOT ROUTINELY PERFORMED DUE TO PREDICTABILITY OF AMP/PEN/VAN SUSCEPTIBILITY.   Report Status 05/24/2012 FINAL   Final   URINE CULTURE     Status: Normal   Collection Time   05/22/12  4:52 PM      Component Value Range Status Comment   Specimen Description URINE, CATHETERIZED   Final    Special Requests NONE   Final    Culture  Setup Time [id number]   Final    Colony Count NO GROWTH   Final    Culture NO GROWTH   Final    Report Status 05/23/2012 FINAL   Final   MRSA PCR SCREENING     Status: Normal   Collection Time   05/23/12  6:07 PM      Component Value Range Status Comment   MRSA by PCR NEGATIVE  NEGATIVE Final     Studies/Results: All recent x-ray/radiology reports have been reviewed in detail.   Medications: I have reviewed the patient's complete medication list.  Assessment/Plan:  Severe anemia in setting of chronic anemia S/p 2U PRBC on 6/25 - Hgb fluctuating, but currently no indication for  further transfusion - no evidence of acute blood loss - follow  hydronephrosis Has been evaluated by Urology - initially plan was to undergo CT guided perc neph but this had to be postponed as pt was not able to tolerate laying flat - she tells me that she feels she can now lay flat - I spoke with Dr. Janice Norrie, who suggests that we reassess her with a CT scan prior to scheduling a perc nephrostomy tube   Acute on Chronic kidney disease stage III, followed by nephrology baseline creatinine 2.4 HD has now been initiated - tolerating well thus far - Nephrology following  Hyperkalemia Due to above - being managed w/ HD - improved  Metabolic acidosis Due to above - being managed w/ HD - improved  Volume overload Due to above - being managed w/ HD - improving  nonischemic cardiomyopathy last EF 35%-40% 04/06/12 Appears to be well compensated at this time - volume overload felt to be renal in origin and not a reflection of decompensated CHF  hydradenitis suprativa of the groin Followed at [facility] and in [facility] - seen by ID this admit who state their is no role for abx tx  DM2 Reasonably controlled at present - follow trend  Hypothyroidism  Morbid obesity  Gout  medical non-compliance   Cherene Altes, MD [facility] Office  [phone] Pager [phone]  On-Call/Text Page:      [email or web address]      password [facility]

## 2012-05-25 NOTE — Progress Notes (Signed)
T: 97.8   Max 101   V/S stable No flank pain. Creat is 5.14, was 5.5 yesterday. For CT scan today.

## 2012-05-26 DIAGNOSIS — N179 Acute kidney failure, unspecified: Secondary | ICD-10-CM

## 2012-05-26 DIAGNOSIS — L732 Hidradenitis suppurativa: Secondary | ICD-10-CM

## 2012-05-26 DIAGNOSIS — E8779 Other fluid overload: Secondary | ICD-10-CM

## 2012-05-26 DIAGNOSIS — R112 Nausea with vomiting, unspecified: Secondary | ICD-10-CM

## 2012-05-26 LAB — CBC
HCT: 26.6 % — ABNORMAL LOW (ref 36.0–46.0)
Hemoglobin: 8.3 g/dL — ABNORMAL LOW (ref 12.0–15.0)
RBC: 3.26 MIL/uL — ABNORMAL LOW (ref 3.87–5.11)
WBC: 14.5 10*3/uL — ABNORMAL HIGH (ref 4.0–10.5)

## 2012-05-26 LAB — BASIC METABOLIC PANEL
BUN: 71 mg/dL — ABNORMAL HIGH (ref 6–23)
Chloride: 104 mEq/L (ref 96–112)
GFR calc Af Amer: 9 mL/min — ABNORMAL LOW (ref >90)
Glucose, Bld: 108 mg/dL — ABNORMAL HIGH (ref 70–99)
Potassium: 4 mEq/L (ref 3.5–5.1)

## 2012-05-26 LAB — GLUCOSE, CAPILLARY
Glucose-Capillary: 90 mg/dL (ref 70–99)
Glucose-Capillary: 112 mg/dL — ABNORMAL HIGH (ref 70–99)

## 2012-05-26 NOTE — Progress Notes (Signed)
[facility] [place] TEAM 1 - Stepdown/ICU TEAM  PCP:  [name], MD  Subjective: 51yo w/ a complex past medical history who presented with recurrent shortness of breath worse with exertion. She had similar complaints in May when she was hospitalized. She stated that she had not been taking her meds routinely since discharge as she was preoccupied with a project she had been working on.  In the ED she was found to have acute on chronic anemia with hemoglobin of 7.1g/dl from a baseline of 9.8 a month ago.  She also had a bump in creatinine from a baseline of 2.4 to 4.06.  Pt was transferred to [facility] on 6/27 to undergo perc nephrostomy as well as placement of a tunneled HD cath.  She was unable to tolerate either procedure due to SOB w/ horizontal positioning, therefore a L IJ HD cath was placed.    The pt states that she feels much better today. She no longer c/o sob.  She denies cp, f/c, n/v, or abdom pain.    Objective:  Intake/Output Summary (Last 24 hours) at 05/26/12 1611 Last data filed at 05/26/12 1505  Gross per 24 hour  Intake   1094 ml  Output   2025 ml  Net   -931 ml   Blood pressure 111/49, pulse 72, temperature 97.9 F (36.6 C), temperature source Oral, resp. rate 25, height 5\' 6"  (1.676 m), weight 164.4 kg (362 lb 7 oz), SpO2 99.00%.  CBG (last 3)   Basename 05/26/12 1155 05/26/12 0752 05/25/12 2150  GLUCAP 107* 90 143*   Physical Exam: General: No acute respiratory distress at rest  Lungs: distant BS th/o - bibasilar crackles - no wheeze  Cardiovascular:  distant HS - regular rate and rhythm without murmur gallop or rub Abdomen: obese, nontender, nondistended, soft, bowel sounds positive, no rebound, no ascites, no appreciable mass Extremities: trace B LE edema w/o cyanosis or clubbing   Lab Results:  Basename 05/26/12 0415 05/25/12 0500 05/24/12 0855  NA 138 138 137  K 4.0 4.1 4.8  CL 104 104 106  CO2 18* 18* 15*  GLUCOSE 108* 111* 77  BUN 71*  69* 78*  CREATININE 5.58* 5.14* 5.57*  CALCIUM 7.4* 7.5* 8.1*  MG -- -- --  PHOS -- 6.6* 7.5*    Basename 05/25/12 0500 05/24/12 0855  AST -- --  ALT -- --  ALKPHOS -- --  BILITOT -- --  PROT -- --  ALBUMIN 1.7* 1.9*    Basename 05/26/12 0415 05/25/12 0500 05/24/12 0855  WBC 14.5* 17.9* 15.9*  NEUTROABS -- -- --  HGB 8.3* 8.0* 8.7*  HCT 26.6* 26.4* 28.9*  MCV 81.6 80.7 82.1  PLT 355 332 381   Micro Results: Recent Results (from the past 240 hour(s))  WOUND CULTURE     Status: Normal   Collection Time   05/21/12  9:13 PM      Component Value Range Status Comment   Specimen Description OTHER   Final    Special Requests NONE   Final    Gram Stain     Final    Value: NO WBC SEEN     FEW SQUAMOUS EPITHELIAL CELLS PRESENT     FEW GRAM POSITIVE COCCI IN PAIRS     FEW GRAM NEGATIVE COCCOBACILLI   Culture     Final    Value: MODERATE GROUP B STREP(S.AGALACTIAE)ISOLATED     Note: TESTING AGAINST S. AGALACTIAE NOT ROUTINELY PERFORMED DUE TO PREDICTABILITY OF AMP/PEN/VAN  SUSCEPTIBILITY.   Report Status 05/24/2012 FINAL   Final   URINE CULTURE     Status: Normal   Collection Time   05/22/12  4:52 PM      Component Value Range Status Comment   Specimen Description URINE, CATHETERIZED   Final    Special Requests NONE   Final    Culture  Setup Time [id number]   Final    Colony Count NO GROWTH   Final    Culture NO GROWTH   Final    Report Status 05/23/2012 FINAL   Final   MRSA PCR SCREENING     Status: Normal   Collection Time   05/23/12  6:07 PM      Component Value Range Status Comment   MRSA by PCR NEGATIVE  NEGATIVE Final     Studies/Results: All recent x-ray/radiology reports have been reviewed in detail.   Medications: I have reviewed the patient's complete medication list.  Assessment/Plan:  L hydronephrosis Has been evaluated by Urology - initially plan was to undergo CT guided perc neph but this had to be postponed as pt was not able to tolerate laying flat  - her f/u CT scan suggests ureteral stones - I discussed the CT with IR this morning, and they felt that the location of the stones made JJ stent placement the most appropriate intervention - Dr. Janice Norrie has seen her today and it appears that he is in agreement with JJ stent, assuming the pt is felt to be medically stable for general anesthesia - I have discussed this with the patient, who states that she is willing to undergo the procedure - I will defer to Urology as to the timing of the procedure  Medical clearance Pt's major risk factor for anesthesia is her CHF - this is currently well compensated - she denies cp or anginal type symptoms - her EKG is w/o acute findings - though her poor general health also negatively affects her fitness for surgery, I do not feel that any further evaluation or intervention could meaningfully further reduce her perioperative risk   Severe anemia in setting of chronic anemia S/p 2U PRBC on 6/25 - Hgb fluctuating, but currently no indication for further transfusion - no evidence of acute blood loss - follow  Acute on Chronic kidney disease stage III, followed by nephrology baseline creatinine 2.4 Nephrology following - at present HD is on hold - crt is slowly climbing   Hyperkalemia Controlled at present   Metabolic acidosis Slowly improving  Volume overload Due to above - is much improved clinically, though I/O balance has not been as negative over last 24hrs as I would like - Neph following to determine when/if HD to be resumed  nonischemic cardiomyopathy last EF 35%-40% 04/06/12 Appears to be well compensated at this time - volume overload felt to be renal in origin and not a reflection of decompensated CHF  hydradenitis suprativa of the groin Followed at [facility] and in [facility] - seen by ID this admit who state their is no role for abx tx  DM2 Reasonably controlled at present - follow trend  Hypothyroidism TSH in May was 4.0 - suspect  pt only intermittently compliant w/ replacement - rechecking TSH at this time would not prove helpful  Morbid obesity  Gout  medical non-compliance   Dispo Medically stable for 6700 - awaiting Urology decision on JJ stent timing   Cherene Altes, MD [facility] Office  [phone] Pager [phone]  On-Call/Text  Page:      [email or web address]      password [facility]

## 2012-05-26 NOTE — Progress Notes (Signed)
Subjective: Patient reports no pain  Objective: Vital signs in last 24 hours: Temp:  [97.5 F (36.4 C)-98.2 F (36.8 C)] 97.9 F (36.6 C) (06/30 1505) Pulse Rate:  [69-79] 72  (06/30 1200) Resp:  [14-26] 25  (06/30 1200) BP: (111-140)/(47-72) 111/49 mmHg (06/30 1200) SpO2:  [98 %-100 %] 99 % (06/30 1200) Weight:  [362 lb 7 oz (164.4 kg)] 362 lb 7 oz (164.4 kg) (06/30 0618)  Intake/Output from previous day: 06/29 0701 - 06/30 0700 In: 1006 [P.O.:820; IV Piggyback:186] Out: 1875 [Urine:1875] Intake/Output this shift: Total I/O In: 562 [P.O.:500; IV Piggyback:62] Out: 525 [Urine:525]  Physical Exam:   Abdomen - Soft, non-tender & non-distended  Lab Results:  Basename 05/26/12 0415 05/25/12 0500 05/24/12 0855  HGB 8.3* 8.0* 8.7*  HCT 26.6* 26.4* 28.9*   BMET  Basename 05/26/12 0415 05/25/12 0500  NA 138 138  K 4.0 4.1  CL 104 104  CO2 18* 18*  GLUCOSE 108* 111*  BUN 71* 69*  CREATININE 5.58* 5.14*  CALCIUM 7.4* 7.5*   No results found for this basename: LABPT:3,INR:3 in the last 72 hours No results found for this basename: LABURIN:1 in the last 72 hours Results for orders placed during the hospital encounter of 05/21/12  WOUND CULTURE     Status: Normal   Collection Time   05/21/12  9:13 PM      Component Value Range Status Comment   Specimen Description OTHER   Final    Special Requests NONE   Final    Gram Stain     Final    Value: NO WBC SEEN     FEW SQUAMOUS EPITHELIAL CELLS PRESENT     FEW GRAM POSITIVE COCCI IN PAIRS     FEW GRAM NEGATIVE COCCOBACILLI   Culture     Final    Value: MODERATE GROUP B STREP(S.AGALACTIAE)ISOLATED     Note: TESTING AGAINST S. AGALACTIAE NOT ROUTINELY PERFORMED DUE TO PREDICTABILITY OF AMP/PEN/VAN SUSCEPTIBILITY.   Report Status 05/24/2012 FINAL   Final   URINE CULTURE     Status: Normal   Collection Time   05/22/12  4:52 PM      Component Value Range Status Comment   Specimen Description URINE, CATHETERIZED   Final    Special Requests NONE   Final    Culture  Setup Time [id number]   Final    Colony Count NO GROWTH   Final    Culture NO GROWTH   Final    Report Status 05/23/2012 FINAL   Final   MRSA PCR SCREENING     Status: Normal   Collection Time   05/23/12  6:07 PM      Component Value Range Status Comment   MRSA by PCR NEGATIVE  NEGATIVE Final     Studies/Results: Ct Abdomen Pelvis Wo Contrast  05/25/2012  *RADIOLOGY REPORT*  Clinical Data: Left hydronephrosis question nephrolithiasis, history hypertension, diabetes, stage III chronic kidney disease, obesity  CT ABDOMEN AND PELVIS WITHOUT CONTRAST  Technique:  Multidetector CT imaging of the abdomen and pelvis was performed following the standard protocol without intravenous contrast. Sagittal and coronal MPR images reconstructed from axial data set.  Comparison: 07/27/2009  Findings: Patient scanned in right lateral decubitus position. Scattered beam hardening artifacts secondary to body habitus. Lung bases clear. Scattered ascites. Within above limitations, no definite focal abnormalities of the liver, spleen, pancreas, or adrenal glands.  Bilateral nonobstructing renal calculi, largest mid right kidney 1.8 x 1.3 cm image 48. Left hydronephrosis  and hydroureter secondary multiple mid left ureteral calculi, largest 6 mm diameter. Bladder decompressed by Foley catheter. No additional ureteral calcification seen. Unremarkable uterus and adnexae. Appendix not definitely localized.  Stomach and bowel loops grossly unremarkable without definite dilatation or wall thickening. No free intraperitoneal air, mass or adenopathy. Few minimally prominent periaortic lymph nodes extending to bifurcation. Significant degenerative disc disease changes L5-S1.  IMPRESSION: Left hydronephrosis and hydroureter secondary to multiple mid left ureteral calculi largest 6 mm diameter. Ascites.  Original Report Authenticated By: Burnetta Sabin, M.D.   I have reviewed the CT scan.  There  are non obstructing right renal calculi, moderate left hydronephrosis secondary to at least 2 mid ureteral calculi measuring 6 mm in diameter.  Assessment/Plan:  Left hydronephrosis.  Left mid ureteral calculi.  Right renal calculi  Renal insufficiency  Morbid obesity.  Patient needs left percutaneous nephrostomy to decompress the left kidney.  The other option is JJ stent placement if she is medically stable for general anesthesia.   LOS: 5 days   [name] 05/26/2012, 3:13 PM

## 2012-05-26 NOTE — Progress Notes (Signed)
Patient transferred to 6737. Report called to RN on 6700 and all questions answered. Patient's VVS. Patient transferred via bed with NT and family notified of new room assignment.

## 2012-05-26 NOTE — Progress Notes (Signed)
PT Cancellation Note  Treatment cancelled today due to patient's refusal to participate. Pt refused therapy three times today. Spoke with pt regarding need for physical therapy and she stated that she does not feel as though she needs therapy. Signing off. Please reorder if necessary.  Thanks, 05/26/2012 Ambrose Finland DPT PAGER: [phone] OFFICE: [phone]    Ambrose Finland 05/26/2012, 3:59 PM

## 2012-05-26 NOTE — Progress Notes (Signed)
SUBJECTIVE:  Feeling better  OBJECTIVE:   Vitals:   Filed Vitals:   05/25/12 2300 05/26/12 0300 05/26/12 0618 05/26/12 0755  BP: 113/47 119/54    Pulse: 69 74    Temp: 97.5 F (36.4 C) 98.2 F (36.8 C)  97.7 F (36.5 C)  TempSrc: Oral Oral  Oral  Resp: 23 25    Height:      Weight:   164.4 kg (362 lb 7 oz)   SpO2: 100% 100%     I&O's:   Intake/Output Summary (Last 24 hours) at 05/26/12 0858 Last data filed at 05/26/12 0754  Gross per 24 hour  Intake    806 ml  Output   1800 ml  Net   -994 ml   TELEMETRY: Reviewed telemetry pt in NSR     PHYSICAL EXAM General: Well developed, well nourished, in no acute distress Head: Eyes PERRLA, No xanthomas.   Normal cephalic and atramatic  Lungs:   Clear bilaterally to auscultation and percussion. Heart:   HRRR S1 S2 Pulses are 2+ & equal. Abdomen: Bowel sounds are positive, abdomen soft and non-tender without masses Extremities:   No clubbing, cyanosis or edema.  DP +1 Neuro: Alert and oriented X 3. Psych:  Good affect, responds appropriately   LABS: Basic Metabolic Panel:  Basename 05/26/12 0415 05/25/12 0500 05/24/12 0855  NA 138 138 --  K 4.0 4.1 --  CL 104 104 --  CO2 18* 18* --  GLUCOSE 108* 111* --  BUN 71* 69* --  CREATININE 5.58* 5.14* --  CALCIUM 7.4* 7.5* --  MG -- -- --  PHOS -- 6.6* 7.5*   Liver Function Tests:  Basename 05/25/12 0500 05/24/12 0855  AST -- --  ALT -- --  ALKPHOS -- --  BILITOT -- --  PROT -- --  ALBUMIN 1.7* 1.9*   No results found for this basename: LIPASE:2,AMYLASE:2 in the last 72 hours CBC:  Basename 05/26/12 0415 05/25/12 0500  WBC 14.5* 17.9*  NEUTROABS -- --  HGB 8.3* 8.0*  HCT 26.6* 26.4*  MCV 81.6 80.7  PLT 355 332    Lab Results  Component Value Date   INR 1.30 05/22/2012   INR 1.40 November 29, 202013   INR 1.43 01/29/2012    RADIOLOGY: Ct Abdomen Pelvis Wo Contrast  05/25/2012  *RADIOLOGY REPORT*  Clinical Data: Left hydronephrosis question nephrolithiasis, history  hypertension, diabetes, stage III chronic kidney disease, obesity  CT ABDOMEN AND PELVIS WITHOUT CONTRAST  Technique:  Multidetector CT imaging of the abdomen and pelvis was performed following the standard protocol without intravenous contrast. Sagittal and coronal MPR images reconstructed from axial data set.  Comparison: 07/27/2009  Findings: Patient scanned in right lateral decubitus position. Scattered beam hardening artifacts secondary to body habitus. Lung bases clear. Scattered ascites. Within above limitations, no definite focal abnormalities of the liver, spleen, pancreas, or adrenal glands.  Bilateral nonobstructing renal calculi, largest mid right kidney 1.8 x 1.3 cm image 48. Left hydronephrosis and hydroureter secondary multiple mid left ureteral calculi, largest 6 mm diameter. Bladder decompressed by Foley catheter. No additional ureteral calcification seen. Unremarkable uterus and adnexae. Appendix not definitely localized.  Stomach and bowel loops grossly unremarkable without definite dilatation or wall thickening. No free intraperitoneal air, mass or adenopathy. Few minimally prominent periaortic lymph nodes extending to bifurcation. Significant degenerative disc disease changes L5-S1.  IMPRESSION: Left hydronephrosis and hydroureter secondary to multiple mid left ureteral calculi largest 6 mm diameter. Ascites.  Original Report Authenticated By: Jeannette How.  Thornton Papas, M.D.   Dg Chest 1 View  05/23/2012  *RADIOLOGY REPORT*  Clinical Data: Diatek catheter placement  CHEST - 1 VIEW  Comparison: Portable exam 1727 hours compared to 05/21/2012  Findings: New left jugular large bore dual lumen central venous catheter. Tip is not well visualized due to body habitus, question projecting over the right atrium. No pneumothorax. Significant enlargement of cardiac silhouette with pulmonary vascular congestion. Low lung volumes. No gross infiltrate or effusion. Osseous mineralization normal.  IMPRESSION: No  pneumothorax following Diatek catheter insertion. The tip of the catheter is suboptimally visualized due to body habitus and underpenetration, question projecting over the right atrium; if confirmation of the position of the catheter is required, recommend repeat imaging.  Original Report Authenticated By: Burnetta Sabin, M.D.   US Renal  05/21/2012  *RADIOLOGY REPORT*  Clinical Data: Chronic renal disease with acute renal failure.  RENAL/URINARY TRACT ULTRASOUND COMPLETE  Comparison:  11/11/2010  Findings:  Right Kidney:  Visualization of the right kidney is somewhat limited.  Right renal length is measured at 13 cm.  There appears to be thinning of the parenchymal cortex with increased echogenic appearance of the parenchyma consistent with chronic medical renal disease.  There is a vague hyperechoic focus in the upper pole measuring 2.2 cm but is not well visualized.  A stone is not excluded.  No hydronephrosis.  Left Kidney:  The left kidney measures 15.4 cm length.  There is diffuse thinning and increased echotexture throughout the parenchyma consistent with chronic medical renal disease.  There is moderate pyelocaliectasis.  Urinary obstruction is suggested.  This is new since the previous study.  Bladder:  The bladder is decompressed with Foley catheter and is not visualized.  Incidental note of free fluid in the right upper quadrant and right lower quadrant consistent with ascites.  IMPRESSION: Diffuse parenchymal thinning and increased parenchymal echotexture consistent with medical renal disease bilaterally.  Poor visualization of the right kidney.  Upper pole stone is not excluded.  New hydronephrosis of the left kidney.  Right abdominal ascites.  Original Report Authenticated By: Neale Burly, M.D.   Dg Chest Port 1 View  05/21/2012  *RADIOLOGY REPORT*  Clinical Data: Rule out pneumonia, cough, congestion, shortness of breath  PORTABLE CHEST - 1 VIEW  Comparison: 04/04/2012  Findings: Study is  limited by patient's large body habitus. Cardiomegaly again noted.  There is asymmetric hazy airspace disease left lung and right base.  The findings may be due to asymmetric edema or pneumonia.  Clinical correlation is necessary. Follow-up two-views chest is recommended.  IMPRESSION: .  Cardiomegaly again noted.  There is asymmetric hazy airspace disease left lung and right base.  The findings may be due to asymmetric edema or pneumonia.  Clinical correlation is necessary. Follow-up two-views chest is recommended.  Original Report Authenticated By: Lahoma Crocker, M.D.      ASSESSMENT:  1. Acute on chronic systolic CHF secondary to volume overload from kidney and urologic dysfunction and medical noncompliance with meds  2. Moderate LV dysfunction EF 35-40%  3. Morbid obesity  4. DM  5. Chronic hidradenitis  6. CKD stage 4 - now on HD  7. Severe anemia in setting of chronic anemia s/p PRBC transfusion  8. Hydronephrosis   PLAN:   Continue current medical therapy for cardiomyopathy.  No new recs.  Will sign off.  Call with any questions.  Sueanne Margarita, MD  05/26/2012  8:58 AM

## 2012-05-26 NOTE — Progress Notes (Signed)
Patient ID: Ellean Bando, female   DOB: 1961/10/10, 51 y.o.   MRN: [id number] S:feels better O:BP 119/54  Pulse 74  Temp 97.7 F (36.5 C) (Oral)  Resp 25  Ht 5\' 6"  (1.676 m)  Wt 164.4 kg (362 lb 7 oz)  BMI 58.50 kg/m2  SpO2 100%  Intake/Output Summary (Last 24 hours) at 05/26/12 1054 Last data filed at 05/26/12 0754  Gross per 24 hour  Intake    806 ml  Output   1800 ml  Net   -994 ml   Weight change: -8.4 kg (-18 lb 8.3 oz) Gen:WD WN obese AAF in NAD CVS:RRR Resp:CTA [id number] Ext:no edema   Lab 05/26/12 0415 05/25/12 0500 05/24/12 0855 05/23/12 0415 05/22/12 1125 05/22/12 0615 05/21/12 1351  NA 138 138 137 131* 131* 132* 133*  K 4.0 4.1 4.8 6.1* 6.1* 6.0* 5.2*  CL 104 104 106 106 107 107 107  CO2 18* 18* 15* 13* 13* 14* 12*  GLUCOSE 108* 111* 77 120* 138* 147* 157*  BUN 71* 69* 78* 73* 66* 66* 66*  CREATININE 5.58* 5.14* 5.57* 4.99* 4.36* 4.22* 4.06*  ALBUMIN -- 1.7* 1.9* 2.0* -- 2.0* 1.9*  CALCIUM 7.4* 7.5* 8.1* 8.4 8.5 8.6 8.3*  PHOS -- 6.6* 7.5* -- -- -- --  AST -- -- -- 6 -- 6 5  ALT -- -- -- <5 -- <5 <5   Liver Function Tests:  Lab 05/25/12 0500 05/24/12 0855 05/23/12 0415 05/22/12 0615 05/21/12 1351  AST -- -- 6 6 5   ALT -- -- <5 <5 <5  ALKPHOS -- -- 146* 148* 144*  BILITOT -- -- 0.3 0.3 0.2*  PROT -- -- 8.3 8.6* 7.9  ALBUMIN 1.7* 1.9* 2.0* -- --   No results found for this basename: LIPASE:3,AMYLASE:3 in the last 168 hours No results found for this basename: AMMONIA:3 in the last 168 hours CBC:  Lab 05/26/12 0415 05/25/12 0500 05/24/12 0855 05/23/12 0415 05/22/12 0615 05/21/12 1351  WBC 14.5* 17.9* 15.9* -- -- --  NEUTROABS -- -- -- -- -- 17.8*  HGB 8.3* 8.0* 8.7* -- -- --  HCT 26.6* 26.4* 28.9* -- -- --  MCV 81.6 80.7 82.1 83.4 83.8 --  PLT 355 332 381 -- -- --   Cardiac Enzymes:  Lab 05/22/12 1420 05/22/12 0615 05/21/12 1351  CKTOTAL 101 47 --  CKMB 3.5 2.7 --  CKMBINDEX -- -- --  TROPONINI <0.30 <0.30 <0.30   CBG:  Lab 05/26/12 0752  05/25/12 2150 05/25/12 1742 05/25/12 1221 05/25/12 0748  GLUCAP 90 143* 114* 100* 86    Iron Studies: No results found for this basename: IRON,TIBC,TRANSFERRIN,FERRITIN in the last 72 hours Studies/Results: Ct Abdomen Pelvis Wo Contrast  05/25/2012  *RADIOLOGY REPORT*  Clinical Data: Left hydronephrosis question nephrolithiasis, history hypertension, diabetes, stage III chronic kidney disease, obesity  CT ABDOMEN AND PELVIS WITHOUT CONTRAST  Technique:  Multidetector CT imaging of the abdomen and pelvis was performed following the standard protocol without intravenous contrast. Sagittal and coronal MPR images reconstructed from axial data set.  Comparison: 07/27/2009  Findings: Patient scanned in right lateral decubitus position. Scattered beam hardening artifacts secondary to body habitus. Lung bases clear. Scattered ascites. Within above limitations, no definite focal abnormalities of the liver, spleen, pancreas, or adrenal glands.  Bilateral nonobstructing renal calculi, largest mid right kidney 1.8 x 1.3 cm image 48. Left hydronephrosis and hydroureter secondary multiple mid left ureteral calculi, largest 6 mm diameter. Bladder decompressed by Foley catheter. No additional ureteral calcification  seen. Unremarkable uterus and adnexae. Appendix not definitely localized.  Stomach and bowel loops grossly unremarkable without definite dilatation or wall thickening. No free intraperitoneal air, mass or adenopathy. Few minimally prominent periaortic lymph nodes extending to bifurcation. Significant degenerative disc disease changes L5-S1.  IMPRESSION: Left hydronephrosis and hydroureter secondary to multiple mid left ureteral calculi largest 6 mm diameter. Ascites.  Original Report Authenticated By: Burnetta Sabin, M.D.      . aspirin EC  81 mg Oral Daily  . carvedilol  6.25 mg Oral BID WC  . DULoxetine  60 mg Oral Daily  . enoxaparin (LOVENOX) injection  30 mg Subcutaneous Q24H  . ferrous sulfate  325 mg  Oral BID  . folic acid  1 mg Oral Daily  . furosemide  120 mg Intravenous TID  . hydrALAZINE  37.5 mg Oral TID  . insulin aspart  0-5 Units Subcutaneous QHS  . insulin aspart  0-9 Units Subcutaneous TID WC  . insulin glargine  35 Units Subcutaneous QHS  . isosorbide mononitrate  30 mg Oral Daily  . levothyroxine  25 mcg Oral QAC breakfast  . LORazepam  0.5-1 mg Intravenous Once  . senna  1 tablet Oral BID  . simvastatin  20 mg Oral q1800  . sodium bicarbonate  1,300 mg Oral BID  . sodium chloride  3 mL Intravenous Q12H  . ascorbic acid  500 mg Oral Daily  . zinc sulfate  220 mg Oral Daily    BMET    Component Value Date/Time   NA 138 05/26/2012 0415   K 4.0 05/26/2012 0415   CL 104 05/26/2012 0415   CO2 18* 05/26/2012 0415   GLUCOSE 108* 05/26/2012 0415   BUN 71* 05/26/2012 0415   CREATININE 5.58* 05/26/2012 0415   CREATININE 1.90* 03/07/2012 1652   CALCIUM 7.4* 05/26/2012 0415   GFRNONAA 8* 05/26/2012 0415   GFRAA 9* 05/26/2012 0415   CBC    Component Value Date/Time   WBC 14.5* 05/26/2012 0415   WBC 17.8* 06/01/2008 1547   RBC 3.26* 05/26/2012 0415   RBC 4.50 06/01/2008 1547   HGB 8.3* 05/26/2012 0415   HGB 10.1* 06/01/2008 1547   HCT 26.6* 05/26/2012 0415   HCT 33.0* 06/01/2008 1547   PLT 355 05/26/2012 0415   PLT 452* 06/01/2008 1547   MCV 81.6 05/26/2012 0415   MCV 73.2* 06/01/2008 1547   MCH 25.5* 05/26/2012 0415   MCH 22.5* 06/01/2008 1547   MCHC 31.2 05/26/2012 0415   MCHC 30.7* 06/01/2008 1547   RDW 21.0* 05/26/2012 0415   RDW 18.4* 06/01/2008 1547   LYMPHSABS 1.0 05/21/2012 1351   LYMPHSABS 1.1 06/01/2008 1547   MONOABS 1.0 05/21/2012 1351   MONOABS 0.6 06/01/2008 1547   EOSABS 0.8* 05/21/2012 1351   EOSABS 0.5 06/01/2008 1547   BASOSABS 0.0 05/21/2012 1351   BASOSABS 0.0 06/01/2008 1547   Assessment/Plan:  1. AKI/CKD- multifactorial. progressive CKD related to poorly controlled chronic CHF. Found to be profoundly anemic. Pt now with hydronephrosis and hydroureter with CT evidence of multiple  renal calculi bilaterally.  Increased UOP after HD with UF (likely improved cardiac function after fluid removal) 1. Hold off on HD today and follow UOP and daily Scr.  2. Possible cysto with stone removal per Urology vs. PNT by IR  2. Hyperkalemia- improved 3. Metabolic acidosis- started po bicarb due to CHF, improved with HD 4. CHF- slight response to increase IV lasix dose. Follow off HD  5. ABLA vs other  process- w/u profound and recurrent anemia 6. Left sided hydro- with obstruction stones.  Plan per urology. 7. Obesity 8. Hydradenitis suppurative- agree with ID eval and hold off vanco dose due to AKI and will likely need surgical drainage. 9. DM- per primary svc 10. Hypoalbuminemia- will collect 24hr Uprot, spep/upep 11. F/E/N- resume diabetic diet 12. Dispo- may need CIR 13.   [place] A

## 2012-05-27 ENCOUNTER — Encounter (HOSPITAL_COMMUNITY): Payer: Self-pay | Admitting: Pharmacy Technician

## 2012-05-27 ENCOUNTER — Other Ambulatory Visit: Payer: Self-pay | Admitting: Urology

## 2012-05-27 DIAGNOSIS — N183 Chronic kidney disease, stage 3 unspecified: Secondary | ICD-10-CM

## 2012-05-27 DIAGNOSIS — L732 Hidradenitis suppurativa: Secondary | ICD-10-CM

## 2012-05-27 LAB — RENAL FUNCTION PANEL
CO2: 21 mEq/L (ref 19–32)
Calcium: 8 mg/dL — ABNORMAL LOW (ref 8.4–10.5)
Creatinine, Ser: 6.22 mg/dL — ABNORMAL HIGH (ref 0.50–1.10)
GFR calc non Af Amer: 7 mL/min — ABNORMAL LOW (ref >90)

## 2012-05-27 LAB — GLUCOSE, CAPILLARY
Glucose-Capillary: 78 mg/dL (ref 70–99)
Glucose-Capillary: 92 mg/dL (ref 70–99)

## 2012-05-27 LAB — IMMUNOFIXATION ELECTROPHORESIS: IgA: 503 mg/dL — ABNORMAL HIGH (ref 69–380)

## 2012-05-27 LAB — CBC
HCT: 28.2 % — ABNORMAL LOW (ref 36.0–46.0)
Hemoglobin: 8.5 g/dL — ABNORMAL LOW (ref 12.0–15.0)
MCHC: 30.1 g/dL (ref 30.0–36.0)
RBC: 3.43 MIL/uL — ABNORMAL LOW (ref 3.87–5.11)
WBC: 16.1 10*3/uL — ABNORMAL HIGH (ref 4.0–10.5)

## 2012-05-27 NOTE — Progress Notes (Signed)
[facility] [place] TEAM 1 - Stepdown/ICU TEAM  PCP:  [name], MD  Subjective: 51yo w/ a complex past medical history who presented with recurrent shortness of breath worse with exertion. She had similar complaints in May when she was hospitalized. She stated that she had not been taking her meds routinely since discharge as she was preoccupied with a project she had been working on.  In the ED she was found to have acute on chronic anemia with hemoglobin of 7.1g/dl from a baseline of 9.8 a month ago.  She also had a bump in creatinine from a baseline of 2.4 to 4.06.  Pt was transferred to [facility] on 6/27 to undergo perc nephrostomy as well as placement of a tunneled HD cath.  She was unable to tolerate either procedure due to SOB w/ horizontal positioning, therefore a L IJ HD cath was placed.    The pt continues to feel good today. She no longer c/o sob.  She denies cp, f/c, n/v, or abdom pain.    Objective:  Intake/Output Summary (Last 24 hours) at 05/27/12 1146 Last data filed at 05/27/12 0900  Gross per 24 hour  Intake    754 ml  Output   1900 ml  Net  -1146 ml   Blood pressure 117/48, pulse 78, temperature 98 F (36.7 C), temperature source Oral, resp. rate 20, height 5\' 6"  (1.676 m), weight 164.7 kg (363 lb 1.6 oz), SpO2 92.00%.  CBG (last 3)   Basename 05/27/12 0737 05/26/12 2120 05/26/12 1716  GLUCAP 78 112* 88   Physical Exam: General: No acute respiratory distress at rest  Lungs: distant BS th/o - bibasilar crackles - no wheeze  Cardiovascular:  distant HS - RRR without murmur gallop or rub  Abdomen: obese, nontender, nondistended, soft, bowel sounds positive, no rebound, no ascites, no appreciable mass Extremities: trace B LE edema w/o cyanosis or clubbing   Lab Results:  Basename 05/27/12 0707 05/26/12 0415 05/25/12 0500  NA 140 138 138  K 4.3 4.0 4.1  CL 105 104 104  CO2 21 18* 18*  GLUCOSE 67* 108* 111*  BUN 77* 71* 69*  CREATININE 6.22* 5.58*  5.14*  CALCIUM 8.0* 7.4* 7.5*  MG -- -- --  PHOS 8.1* -- 6.6*    Basename 05/27/12 0707 05/25/12 0500  AST -- --  ALT -- --  ALKPHOS -- --  BILITOT -- --  PROT -- --  ALBUMIN 2.0* 1.7*    Basename 05/27/12 0707 05/26/12 0415 05/25/12 0500  WBC 16.1* 14.5* 17.9*  NEUTROABS -- -- --  HGB 8.5* 8.3* 8.0*  HCT 28.2* 26.6* 26.4*  MCV 82.2 81.6 80.7  PLT 384 355 332   Micro Results: Recent Results (from the past 240 hour(s))  WOUND CULTURE     Status: Normal   Collection Time   05/21/12  9:13 PM      Component Value Range Status Comment   Specimen Description OTHER   Final    Special Requests NONE   Final    Gram Stain     Final    Value: NO WBC SEEN     FEW SQUAMOUS EPITHELIAL CELLS PRESENT     FEW GRAM POSITIVE COCCI IN PAIRS     FEW GRAM NEGATIVE COCCOBACILLI   Culture     Final    Value: MODERATE GROUP B STREP(S.AGALACTIAE)ISOLATED     Note: TESTING AGAINST S. AGALACTIAE NOT ROUTINELY PERFORMED DUE TO PREDICTABILITY OF AMP/PEN/VAN SUSCEPTIBILITY.   Report  Status 05/24/2012 FINAL   Final   URINE CULTURE     Status: Normal   Collection Time   05/22/12  4:52 PM      Component Value Range Status Comment   Specimen Description URINE, CATHETERIZED   Final    Special Requests NONE   Final    Culture  Setup Time [id number]   Final    Colony Count NO GROWTH   Final    Culture NO GROWTH   Final    Report Status 05/23/2012 FINAL   Final   MRSA PCR SCREENING     Status: Normal   Collection Time   05/23/12  6:07 PM      Component Value Range Status Comment   MRSA by PCR NEGATIVE  NEGATIVE Final     Studies/Results: All recent x-ray/radiology reports have been reviewed in detail.   Medications: I have reviewed the patient's complete medication list.  Assessment/Plan:  L hydronephrosis Has been evaluated by Urology - initially plan was to undergo CT guided perc neph but this had to be postponed as pt was not able to tolerate laying flat - her f/u CT scan suggests  ureteral stones - I discussed the CT with IR, and they felt that the location of the stones made JJ stent placement the most appropriate intervention - Urology has now made plans to place a JJ stent on Tuesday - I have discussed this with the patient, who states that she is willing to undergo the procedure - I will defer to Urology as to the timing of the procedure - the pt would also like to discuss the procedure with the Urologist as well  Medical clearance Pt's major risk factor for anesthesia is her CHF - this is currently well compensated - she denies cp or anginal type symptoms - her EKG is w/o acute findings - though her poor general health also negatively affects her fitness for surgery, I do not feel that any further evaluation or intervention could meaningfully further reduce her perioperative risk   Severe anemia in setting of chronic anemia S/p 2U PRBC on 6/25 - Hgb fluctuating, but currently no indication for further transfusion - no evidence of acute blood loss - follow  Acute on Chronic kidney disease stage III, followed by nephrology baseline creatinine 2.4 Nephrology following - at present HD is on hold - crt is slowly climbing   Hyperkalemia Controlled at present   Metabolic acidosis Slowly improving  Volume overload Due to above - remains in negative balance - Neph following to determine when/if HD to be resumed  nonischemic cardiomyopathy last EF 35%-40% 04/06/12 Appears to be well compensated at this time - volume overload felt to be renal in origin and not a reflection of decompensated CHF - Cards was following but has signed off  hydradenitis suprativa of the groin Followed at [facility] and in [facility] - seen by ID this admit who state their is no role for abx tx  DM2 Reasonably controlled at present - follow trend  Hypothyroidism TSH in May was 4.0 - suspect pt only intermittently compliant w/ replacement - rechecking TSH at this time would not prove  helpful due to noncompliance  Morbid obesity  Gout Well compensated at this time  medical non-compliance   Dispo Remain on 6700 - for JJ stent per Urology in AM  Cherene Altes, MD [facility] Office  [phone] Pager [phone]  On-Call/Text Page:      [email or web address]  password [employer]

## 2012-05-27 NOTE — Progress Notes (Signed)
Patient's BP 103/64 HR 70. Patient asymptomatic,pt has scheduled hydralazine 37.5 mg p.o. MD on call notified,order received not give tonight's hydralazine dose.Will continue to monitor. Antha Niday [employer]

## 2012-05-27 NOTE — H&P (Signed)
Urology Consult  Referring physician: Medicine Reason for referral: Left ureteral stones  Chief Complaint: hydro with stones  History of Present Illness: reviewed all notes over the weekend and CT scan; patient having no pain but renal failure Modifying factors: There are no other modifying factors  Associated signs and symptoms: There are no other associated signs and symptoms Aggravating and relieving factors: There are no other aggravating or relieving factors Severity: Moderate Duration: Persistent   Past Medical History  Diagnosis Date  . Systolic congestive heart failure   . SOB (shortness of breath)   . HTN (hypertension)   . DM type 2 (diabetes mellitus, type 2)   . Chronic kidney disease (CKD), stage III (moderate)   . Iron deficiency anemia   . Morbid obesity   . Hyperkalemia   . Hypothyroidism   . CHF (congestive heart failure)    Past Surgical History  Procedure Date  . Portacath placement     Medications: I have reviewed the patient's current medications. Allergies:  Allergies  Allergen Reactions  . Amoxicillin-Pot Clavulanate Diarrhea  . Rosiglitazone Maleate     REACTION: swelling  . Amoxicillin Rash    Family History  Problem Relation Age of Onset  . Coronary artery disease Mother   . Hypertension Mother   . Diabetes type II Mother   . Malignant hyperthermia Mother   . Coronary artery disease Father   . Hypertension Father   . Malignant hyperthermia Father   . Cancer Maternal Grandfather     ? Type   Social History:  reports that she has never smoked. She has never used smokeless tobacco. She reports that she does not drink alcohol or use illicit drugs.  ROS: All systems are reviewed and negative except as noted.   Physical Exam:  Vital signs in last 24 hours: Temp:  [97.5 F (36.4 C)-98 F (36.7 C)] 98 F (36.7 C) (07/01 0952) Pulse Rate:  [70-78] 78  (07/01 0952) Resp:  [20-26] 20  (07/01 0952) BP: (97-134)/(48-64) 117/48 mmHg (07/01  0952) SpO2:  [92 %-100 %] 92 % (07/01 0952) Weight:  [164.7 kg (363 lb 1.6 oz)] 164.7 kg (363 lb 1.6 oz) (06/30 2116)  Cardiovascular: Skin warm; not flushed Respiratory: Breaths quiet; no shortness of breath Abdomen: No masses Neurological: Normal sensation to touch Musculoskeletal: Normal motor function arms and legs Lymphatics: No inguinal adenopathy Skin: No rashes Genitourinary:no flank tenderness  Laboratory Data:  Results for orders placed during the hospital encounter of 05/21/12 (from the past 72 hour(s))  GLUCOSE, CAPILLARY     Status: Abnormal   Collection Time   05/24/12  1:07 PM      Component Value Range Comment   Glucose-Capillary 100 (*) 70 - 99 mg/dL    Comment 1 Documented in Chart      Comment 2 Notify RN     GLUCOSE, CAPILLARY     Status: Abnormal   Collection Time   05/24/12  5:42 PM      Component Value Range Comment   Glucose-Capillary 124 (*) 70 - 99 mg/dL    Comment 1 Documented in Chart      Comment 2 Notify RN     GLUCOSE, CAPILLARY     Status: Abnormal   Collection Time   05/24/12  9:42 PM      Component Value Range Comment   Glucose-Capillary 158 (*) 70 - 99 mg/dL    Comment 1 Notify RN      Comment 2 Documented in  Chart     RENAL FUNCTION PANEL     Status: Abnormal   Collection Time   05/25/12  5:00 AM      Component Value Range Comment   Sodium 138  135 - 145 mEq/L    Potassium 4.1  3.5 - 5.1 mEq/L    Chloride 104  96 - 112 mEq/L    CO2 18 (*) 19 - 32 mEq/L    Glucose, Bld 111 (*) 70 - 99 mg/dL    BUN 69 (*) 6 - 23 mg/dL    Creatinine, Ser 5.14 (*) 0.50 - 1.10 mg/dL    Calcium 7.5 (*) 8.4 - 10.5 mg/dL    Phosphorus 6.6 (*) 2.3 - 4.6 mg/dL    Albumin 1.7 (*) 3.5 - 5.2 g/dL    GFR calc non Af Amer 9 (*) >90 mL/min    GFR calc Af Amer 10 (*) >90 mL/min   CBC     Status: Abnormal   Collection Time   05/25/12  5:00 AM      Component Value Range Comment   WBC 17.9 (*) 4.0 - 10.5 K/uL    RBC 3.27 (*) 3.87 - 5.11 MIL/uL    Hemoglobin 8.0 (*)  12.0 - 15.0 g/dL    HCT 26.4 (*) 36.0 - 46.0 %    MCV 80.7  78.0 - 100.0 fL    MCH 24.5 (*) 26.0 - 34.0 pg    MCHC 30.3  30.0 - 36.0 g/dL    RDW 20.7 (*) 11.5 - 15.5 %    Platelets 332  150 - 400 K/uL   GLUCOSE, CAPILLARY     Status: Normal   Collection Time   05/25/12  7:48 AM      Component Value Range Comment   Glucose-Capillary 86  70 - 99 mg/dL    Comment 1 Notify RN     GLUCOSE, CAPILLARY     Status: Abnormal   Collection Time   05/25/12 12:21 PM      Component Value Range Comment   Glucose-Capillary 100 (*) 70 - 99 mg/dL    Comment 1 Notify RN     GLUCOSE, CAPILLARY     Status: Abnormal   Collection Time   05/25/12  5:42 PM      Component Value Range Comment   Glucose-Capillary 114 (*) 70 - 99 mg/dL    Comment 1 Notify RN     GLUCOSE, CAPILLARY     Status: Abnormal   Collection Time   05/25/12  9:50 PM      Component Value Range Comment   Glucose-Capillary 143 (*) 70 - 99 mg/dL    Comment 1 Documented in Chart      Comment 2 Notify RN     CBC     Status: Abnormal   Collection Time   05/26/12  4:15 AM      Component Value Range Comment   WBC 14.5 (*) 4.0 - 10.5 K/uL    RBC 3.26 (*) 3.87 - 5.11 MIL/uL    Hemoglobin 8.3 (*) 12.0 - 15.0 g/dL    HCT 26.6 (*) 36.0 - 46.0 %    MCV 81.6  78.0 - 100.0 fL    MCH 25.5 (*) 26.0 - 34.0 pg    MCHC 31.2  30.0 - 36.0 g/dL    RDW 21.0 (*) 11.5 - 15.5 %    Platelets 355  150 - 400 K/uL   BASIC METABOLIC PANEL     Status: Abnormal  Collection Time   05/26/12  4:15 AM      Component Value Range Comment   Sodium 138  135 - 145 mEq/L    Potassium 4.0  3.5 - 5.1 mEq/L    Chloride 104  96 - 112 mEq/L    CO2 18 (*) 19 - 32 mEq/L    Glucose, Bld 108 (*) 70 - 99 mg/dL    BUN 71 (*) 6 - 23 mg/dL    Creatinine, Ser 5.58 (*) 0.50 - 1.10 mg/dL    Calcium 7.4 (*) 8.4 - 10.5 mg/dL    GFR calc non Af Amer 8 (*) >90 mL/min    GFR calc Af Amer 9 (*) >90 mL/min   GLUCOSE, CAPILLARY     Status: Normal   Collection Time   05/26/12  7:52 AM       Component Value Range Comment   Glucose-Capillary 90  70 - 99 mg/dL    Comment 1 Notify RN     GLUCOSE, CAPILLARY     Status: Abnormal   Collection Time   05/26/12 11:55 AM      Component Value Range Comment   Glucose-Capillary 107 (*) 70 - 99 mg/dL    Comment 1 Notify RN     GLUCOSE, CAPILLARY     Status: Normal   Collection Time   05/26/12  5:16 PM      Component Value Range Comment   Glucose-Capillary 88  70 - 99 mg/dL    Comment 1 Notify RN     GLUCOSE, CAPILLARY     Status: Abnormal   Collection Time   05/26/12  9:20 PM      Component Value Range Comment   Glucose-Capillary 112 (*) 70 - 99 mg/dL   RENAL FUNCTION PANEL     Status: Abnormal   Collection Time   05/27/12  7:07 AM      Component Value Range Comment   Sodium 140  135 - 145 mEq/L    Potassium 4.3  3.5 - 5.1 mEq/L    Chloride 105  96 - 112 mEq/L    CO2 21  19 - 32 mEq/L    Glucose, Bld 67 (*) 70 - 99 mg/dL    BUN 77 (*) 6 - 23 mg/dL    Creatinine, Ser 6.22 (*) 0.50 - 1.10 mg/dL    Calcium 8.0 (*) 8.4 - 10.5 mg/dL    Phosphorus 8.1 (*) 2.3 - 4.6 mg/dL    Albumin 2.0 (*) 3.5 - 5.2 g/dL    GFR calc non Af Amer 7 (*) >90 mL/min    GFR calc Af Amer 8 (*) >90 mL/min   CBC     Status: Abnormal   Collection Time   05/27/12  7:07 AM      Component Value Range Comment   WBC 16.1 (*) 4.0 - 10.5 K/uL    RBC 3.43 (*) 3.87 - 5.11 MIL/uL    Hemoglobin 8.5 (*) 12.0 - 15.0 g/dL    HCT 28.2 (*) 36.0 - 46.0 %    MCV 82.2  78.0 - 100.0 fL    MCH 24.8 (*) 26.0 - 34.0 pg    MCHC 30.1  30.0 - 36.0 g/dL    RDW 21.1 (*) 11.5 - 15.5 %    Platelets 384  150 - 400 K/uL   GLUCOSE, CAPILLARY     Status: Normal   Collection Time   05/27/12  7:37 AM      Component Value Range Comment  Glucose-Capillary 78  70 - 99 mg/dL    Recent Results (from the past 240 hour(s))  WOUND CULTURE     Status: Normal   Collection Time   05/21/12  9:13 PM      Component Value Range Status Comment   Specimen Description OTHER   Final    Special  Requests NONE   Final    Gram Stain     Final    Value: NO WBC SEEN     FEW SQUAMOUS EPITHELIAL CELLS PRESENT     FEW GRAM POSITIVE COCCI IN PAIRS     FEW GRAM NEGATIVE COCCOBACILLI   Culture     Final    Value: MODERATE GROUP B STREP(S.AGALACTIAE)ISOLATED     Note: TESTING AGAINST S. AGALACTIAE NOT ROUTINELY PERFORMED DUE TO PREDICTABILITY OF AMP/PEN/VAN SUSCEPTIBILITY.   Report Status 05/24/2012 FINAL   Final   URINE CULTURE     Status: Normal   Collection Time   05/22/12  4:52 PM      Component Value Range Status Comment   Specimen Description URINE, CATHETERIZED   Final    Special Requests NONE   Final    Culture  Setup Time [id number]   Final    Colony Count NO GROWTH   Final    Culture NO GROWTH   Final    Report Status 05/23/2012 FINAL   Final   MRSA PCR SCREENING     Status: Normal   Collection Time   05/23/12  6:07 PM      Component Value Range Status Comment   MRSA by PCR NEGATIVE  NEGATIVE Final    Creatinine:  Basename 05/27/12 0707 05/26/12 0415 05/25/12 0500 05/24/12 0855 05/23/12 0415 05/22/12 1125 05/22/12 0615  CREATININE 6.22* 5.58* 5.14* 5.57* 4.99* 4.36* 4.22*    Xrays: See report/chart Reviewed CT scan  Impression/Assessment:  Hydro with stones  Plan:  Stent tomorrow  [name] A 05/27/2012, 10:42 AM

## 2012-05-27 NOTE — Progress Notes (Signed)
Assessment/Plan:  1. AKI/CKD- multifactorial. progressive CKD related to poorly controlled chronic CHF. Found to be profoundly anemic. Pt now with hydronephrosis and hydroureter with CT evidence of multiple renal calculi bilaterally. Increased UOP after HD with UF (likely improved cardiac function after fluid removal)  1. Hold off on HD today and follow UOP and daily Scr.  2. Cysto with stone removal per Urology for Tuesday  2. Hyperkalemia- improved 3. Metabolic acidosis- started po bicarb due to CHF, improved with HD 4. CHF- slight response to increase IV lasix dose. Follow off HD  5. ABLA vs other process- w/u profound and recurrent anemia 6. Left sided hydro- with obstruction stones. Plan per urology above 7. Obesity 8. Hydradenitis suppurative- agree with ID eval and hold off vanco dose due to AKI and will likely need surgical drainage. 9. DM- per primary svc 10. Hypoalbuminemia- will collect 24hr Uprot, spep/upep   Subjective: Interval History: none.  Objective: Vital signs in last 24 hours: Temp:  [97.5 F (36.4 C)-98.2 F (36.8 C)] 98.2 F (36.8 C) (07/01 1400) Pulse Rate:  [70-78] 70  (07/01 1400) Resp:  [20-26] 22  (07/01 1400) BP: (97-160)/(48-74) 160/74 mmHg (07/01 1400) SpO2:  [92 %-100 %] 99 % (07/01 1400) Weight:  [164.7 kg (363 lb 1.6 oz)] 164.7 kg (363 lb 1.6 oz) (06/30 2116) Weight change: 0.3 kg (10.6 oz)  Intake/Output from previous day: 06/30 0701 - 07/01 0700 In: 806 [P.O.:620; IV Piggyback:186] Out: 2000 [Urine:2000] Intake/Output this shift: Total I/O In: 360 [P.O.:360] Out: 225 [Urine:225]  General appearance: alert, cooperative and appears stated age Obese female Lungs clear Cor RRR Abd Obese Extem tr edema Neuro NF  Lab Results:  [facility] 05/27/12 0707 05/26/12 0415  WBC 16.1* 14.5*  HGB 8.5* 8.3*  HCT 28.2* 26.6*  PLT 384 355   BMET:  Basename 05/27/12 0707 05/26/12 0415  NA 140 138  K 4.3 4.0  CL 105 104  CO2 21 18*  GLUCOSE  67* 108*  BUN 77* 71*  CREATININE 6.22* 5.58*  CALCIUM 8.0* 7.4*   No results found for this basename: PTH:2 in the last 72 hours Iron Studies: No results found for this basename: IRON,TIBC,TRANSFERRIN,FERRITIN in the last 72 hours Studies/Results: Ct Abdomen Pelvis Wo Contrast  05/25/2012  *RADIOLOGY REPORT*  Clinical Data: Left hydronephrosis question nephrolithiasis, history hypertension, diabetes, stage III chronic kidney disease, obesity  CT ABDOMEN AND PELVIS WITHOUT CONTRAST  Technique:  Multidetector CT imaging of the abdomen and pelvis was performed following the standard protocol without intravenous contrast. Sagittal and coronal MPR images reconstructed from axial data set.  Comparison: 07/27/2009  Findings: Patient scanned in right lateral decubitus position. Scattered beam hardening artifacts secondary to body habitus. Lung bases clear. Scattered ascites. Within above limitations, no definite focal abnormalities of the liver, spleen, pancreas, or adrenal glands.  Bilateral nonobstructing renal calculi, largest mid right kidney 1.8 x 1.3 cm image 48. Left hydronephrosis and hydroureter secondary multiple mid left ureteral calculi, largest 6 mm diameter. Bladder decompressed by Foley catheter. No additional ureteral calcification seen. Unremarkable uterus and adnexae. Appendix not definitely localized.  Stomach and bowel loops grossly unremarkable without definite dilatation or wall thickening. No free intraperitoneal air, mass or adenopathy. Few minimally prominent periaortic lymph nodes extending to bifurcation. Significant degenerative disc disease changes L5-S1.  IMPRESSION: Left hydronephrosis and hydroureter secondary to multiple mid left ureteral calculi largest 6 mm diameter. Ascites.  Original Report Authenticated By: Burnetta Sabin, M.D.    I have reviewed the  patient's current medications.    LOS: 6 days   Desani Sprung C 05/27/2012,2:39 PM

## 2012-05-28 ENCOUNTER — Inpatient Hospital Stay (HOSPITAL_COMMUNITY): Payer: BC Managed Care – PPO | Admitting: Anesthesiology

## 2012-05-28 ENCOUNTER — Encounter (HOSPITAL_COMMUNITY)
Admission: EM | Disposition: A | Discharge status: Home / Self Care | Payer: Self-pay | Source: Home / Self Care | Attending: Internal Medicine

## 2012-05-28 ENCOUNTER — Encounter (HOSPITAL_COMMUNITY): Payer: Self-pay | Admitting: Anesthesiology

## 2012-05-28 ENCOUNTER — Ambulatory Visit (HOSPITAL_COMMUNITY): Admission: RE | Admit: 2012-05-28 | Payer: BC Managed Care – PPO | Source: Ambulatory Visit | Admitting: Urology

## 2012-05-28 DIAGNOSIS — N179 Acute kidney failure, unspecified: Secondary | ICD-10-CM | POA: Diagnosis present

## 2012-05-28 DIAGNOSIS — N200 Calculus of kidney: Secondary | ICD-10-CM | POA: Diagnosis present

## 2012-05-28 DIAGNOSIS — E669 Obesity, unspecified: Secondary | ICD-10-CM

## 2012-05-28 DIAGNOSIS — E119 Type 2 diabetes mellitus without complications: Secondary | ICD-10-CM

## 2012-05-28 DIAGNOSIS — N1330 Unspecified hydronephrosis: Secondary | ICD-10-CM | POA: Diagnosis present

## 2012-05-28 DIAGNOSIS — D649 Anemia, unspecified: Secondary | ICD-10-CM

## 2012-05-28 HISTORY — PX: CYSTOSCOPY W/ URETERAL STENT PLACEMENT: SHX1429

## 2012-05-28 LAB — GLUCOSE, CAPILLARY
Glucose-Capillary: 59 mg/dL — ABNORMAL LOW (ref 70–99)
Glucose-Capillary: 63 mg/dL — ABNORMAL LOW (ref 70–99)
Glucose-Capillary: 75 mg/dL (ref 70–99)
Glucose-Capillary: 81 mg/dL (ref 70–99)
Glucose-Capillary: 68 mg/dL — ABNORMAL LOW (ref 70–99)
Glucose-Capillary: 78 mg/dL (ref 70–99)

## 2012-05-28 LAB — RENAL FUNCTION PANEL
Albumin: 2 g/dL — ABNORMAL LOW (ref 3.5–5.2)
BUN: 80 mg/dL — ABNORMAL HIGH (ref 6–23)
Calcium: 7.7 mg/dL — ABNORMAL LOW (ref 8.4–10.5)
Creatinine, Ser: 6.04 mg/dL — ABNORMAL HIGH (ref 0.50–1.10)
Phosphorus: 8.3 mg/dL — ABNORMAL HIGH (ref 2.3–4.6)

## 2012-05-28 SURGERY — CYSTOSCOPY, WITH RETROGRADE PYELOGRAM AND URETERAL STENT INSERTION
Anesthesia: General | Laterality: Left | Wound class: Clean Contaminated

## 2012-05-28 MED ORDER — IOHEXOL 300 MG/ML  SOLN
INTRAMUSCULAR | Status: DC | PRN
  Administered 2012-05-28: 50 mL

## 2012-05-28 MED ORDER — DEXTROSE 50 % IV SOLN: 25.0000 mL | Freq: Once | INTRAVENOUS | Status: AC | PRN

## 2012-05-28 MED ORDER — DEXTROSE 50 % IV SOLN
INTRAVENOUS | Status: AC
  Administered 2012-05-28: 12:00:00
  Administered 2012-05-28: 25 mL via INTRAVENOUS
  Filled 2012-05-28: qty 50

## 2012-05-28 MED ORDER — CIPROFLOXACIN IN D5W 200 MG/100ML IV SOLN
200.0000 mg | INTRAVENOUS | Status: DC
  Filled 2012-05-28: qty 100

## 2012-05-28 MED ORDER — PHENYLEPHRINE HCL 10 MG/ML IJ SOLN
INTRAMUSCULAR | Status: DC | PRN
  Administered 2012-05-28 (×2): 40 ug via INTRAVENOUS

## 2012-05-28 MED ORDER — SODIUM CHLORIDE 0.9 % IV SOLN
INTRAVENOUS | Status: DC
  Administered 2012-05-28: 500 mL via INTRAVENOUS

## 2012-05-28 MED ORDER — SODIUM CHLORIDE 0.9 % IR SOLN
Status: DC | PRN
  Administered 2012-05-28: 3000 mL

## 2012-05-28 MED ORDER — DEXTROSE 50 % IV SOLN
12.5000 g | Freq: Once | INTRAVENOUS | Status: AC
  Administered 2012-05-28: 12.5 g via INTRAVENOUS
  Filled 2012-05-28: qty 50

## 2012-05-28 MED ORDER — CIPROFLOXACIN IN D5W 400 MG/200ML IV SOLN
INTRAVENOUS | Status: DC | PRN
  Administered 2012-05-28: 200 mg via INTRAVENOUS

## 2012-05-28 MED ORDER — FENTANYL CITRATE 0.05 MG/ML IJ SOLN
INTRAMUSCULAR | Status: DC | PRN
  Administered 2012-05-28 (×2): 25 ug via INTRAVENOUS

## 2012-05-28 MED ORDER — FENTANYL CITRATE 0.05 MG/ML IJ SOLN: 25.0000 ug | INTRAMUSCULAR | Status: DC | PRN

## 2012-05-28 MED ORDER — PROMETHAZINE HCL 25 MG/ML IJ SOLN: 6.2500 mg | INTRAMUSCULAR | Status: DC | PRN

## 2012-05-28 MED ORDER — PROPOFOL 10 MG/ML IV BOLUS
INTRAVENOUS | Status: DC | PRN
  Administered 2012-05-28: 200 mg via INTRAVENOUS

## 2012-05-28 SURGICAL SUPPLY — 20 items
ADAPTER CATH URET PLST 4-6FR (CATHETERS) ×2 IMPLANT
ADPR CATH URET STRL DISP 4-6FR (CATHETERS) ×1
BAG URINE DRAINAGE (UROLOGICAL SUPPLIES) ×1 IMPLANT
BAG URO CATCHER STRL LF (DRAPE) ×2 IMPLANT
CATH FOLEY 2WAY SLVR  5CC 16FR (CATHETERS) ×1
CATH FOLEY 2WAY SLVR 5CC 16FR (CATHETERS) IMPLANT
CATH URET 5FR 28IN OPEN ENDED (CATHETERS) ×1 IMPLANT
CLOTH BEACON ORANGE TIMEOUT ST (SAFETY) ×2 IMPLANT
DRAPE CAMERA CLOSED 9X96 (DRAPES) ×2 IMPLANT
GLOVE BIOGEL M STRL SZ7.5 (GLOVE) ×2 IMPLANT
GOWN PREVENTION PLUS XLARGE (GOWN DISPOSABLE) ×2 IMPLANT
GOWN STRL REIN XL XLG (GOWN DISPOSABLE) ×2 IMPLANT
GUIDEWIRE STR DUAL SENSOR (WIRE) ×2 IMPLANT
HOVERMATT HALF SINGLE USE (PATIENT TRANSFER) ×1 IMPLANT
MANIFOLD NEPTUNE II (INSTRUMENTS) ×2 IMPLANT
MARKER SKIN DUAL TIP RULER LAB (MISCELLANEOUS) ×2 IMPLANT
PACK CYSTO (CUSTOM PROCEDURE TRAY) ×2 IMPLANT
STENT CONTOUR 6FRX26X.038 (STENTS) ×1 IMPLANT
SYRINGE 10CC LL (SYRINGE) ×1 IMPLANT
TUBING CONNECTING 10 (TUBING) IMPLANT

## 2012-05-28 NOTE — Anesthesia Postprocedure Evaluation (Signed)
  Anesthesia Post-op Note  Patient: [name]  Procedure(s) Performed: Procedure(s) (LRB): CYSTOSCOPY WITH RETROGRADE PYELOGRAM/URETERAL STENT PLACEMENT (Left)  Patient Location: PACU  Anesthesia Type: General  Level of Consciousness: awake and alert   Airway and Oxygen Therapy: Patient Spontanous Breathing  Post-op Pain: mild  Post-op Assessment: Post-op Vital signs reviewed, Patient's Cardiovascular Status Stable, Respiratory Function Stable, Patent Airway and No signs of Nausea or vomiting  Post-op Vital Signs: stable  Complications: No apparent anesthesia complications. Glucose measurements remain normal range. She has been given 3 doses of IV dextrose. Will continue to monitor for hypoglycemia. Carelink back to [facility] hospital.

## 2012-05-28 NOTE — Op Note (Signed)
Preop diagnosis: Left ureteral calculi Postoperative diagnosis: Left ureteral calculi Surgery: Cystoscopy left retrograde ureterogram and insertion of left ureteral stent Surgeon: Dr. Nicki Reaper Chrisha Vogel  The patient has the above diagnoses with renal failure. She consented to the above procedure. 100 mg IV ciprofloxacin was given. She is very obese making fluoroscopy more difficult  Her Foley catheter was removed.  21 [place] scope was utilized. Bladder mucosa and trigone were normal. The left ureteral orifice was easy to identify. Under fluoroscopic guidance I easily passed an open-ended ureteral catheter 6 French in size to the lower mid ureter. I did a gentle retrograde ureterogram. It was a little bit difficult for the dye to get above the proximal third of the ureter.  At this point I easily passed a sensor wire curling in the lower pole calyx. Visibility was limited due to her obesity. I passed an open-ended ureteral catheter easily to this level removing the wire. I did a low pressure and low-volume retrograde outlining her renal pelvis to look to be dilated  I replaced the sensor wire lower pole calyx and remove the open-end ureteral catheter. Under fluoroscopic and cystoscopic guidance I passed a 26 cm x 6 French double-J stent curling in the lower pole calyx and curling in the bladder. She's 56 in height and I really felt was in good position. Again the x-rays were limited because of her obesity  She had extensive volcano affect with his stent in place. There was a lot of stone sand and the base of the bladder upon insertion of the wire and stent and hopefully at least one of the stones has been significant a fragment. It also likely predicted a residual stone could be quite soft.  In my opinion the patient to increase for lithotripsy and certainly she has 2 stones require ureteroscopy.

## 2012-05-28 NOTE — Transfer of Care (Signed)
Immediate Anesthesia Transfer of Care Note  Patient: [name]  Procedure(s) Performed: Procedure(s) (LRB): CYSTOSCOPY WITH RETROGRADE PYELOGRAM/URETERAL STENT PLACEMENT (Left)  Patient Location: PACU  Anesthesia Type: General  Level of Consciousness: sedated, patient cooperative and responds to stimulaton  Airway & Oxygen Therapy: Patient Spontanous Breathing and Patient connected to face mask oxgen  Post-op Assessment: Report given to PACU RN and Post -op Vital signs reviewed and stable  Post vital signs: Reviewed and stable  Complications: No apparent anesthesia complications

## 2012-05-28 NOTE — Progress Notes (Signed)
CBG:68   Treatment: 15 GM carbohydrate snack  Symptoms: None  Follow-up CBG: Time:1810 CBG Result: 78  Possible Reasons for Event: Inadequate meal intake  Comments/MD notified: yes    Alfredia Client

## 2012-05-28 NOTE — Anesthesia Preprocedure Evaluation (Addendum)
Anesthesia Evaluation  Patient identified by MRN, date of birth, ID band Patient awake    Reviewed: Allergy & Precautions, H&P , NPO status , Patient's Chart, lab work & pertinent test results  Airway Mallampati: III TM Distance: >3 FB Neck ROM: Full    Dental  (+) Poor Dentition and Loose Loose left upper central and lateral incisors.:   Pulmonary shortness of breath,  breath sounds clear to auscultation  Pulmonary exam normal       Cardiovascular hypertension, Pt. on medications and Pt. on home beta blockers +CHF Rhythm:Regular Rate:Normal     Neuro/Psych PSYCHIATRIC DISORDERS negative neurological ROS     GI/Hepatic Neg liver ROS, GERD-  ,Past Medical History   Diagnosis  Date   .  Systolic congestive heart failure     .  SOB (shortness of breath)     .  HTN (hypertension)     .  DM type 2 (diabetes mellitus, type 2)     .  Chronic kidney disease (CKD), stage III (moderate)     .  Iron deficiency anemia     .  Morbid obesity     .  Hyperkalemia     .  Hypothyroidism     .  CHF (congestive heart failure)            Endo/Other  Diabetes mellitus-, Type 2, Insulin DependentHypothyroidism Morbid obesity  Renal/GU Renal InsufficiencyRenal diseaseDialyzed for fluid overload 05/24/12 per patient. Cr 6.04. K 4.2  negative genitourinary   Musculoskeletal negative musculoskeletal ROS (+)   Abdominal (+) + obese,   Peds negative pediatric ROS (+)  Hematology negative hematology ROS (+)   Anesthesia Other Findings   Reproductive/Obstetrics negative OB ROS                      Anesthesia Physical Anesthesia Plan  ASA: III  Anesthesia Plan: General   Post-op Pain Management:    Induction: Intravenous  Airway Management Planned: LMA  Additional Equipment:   Intra-op Plan:   Post-operative Plan: Extubation in OR  Informed Consent: I have reviewed the patients History and Physical,  chart, labs and discussed the procedure including the risks, benefits and alternatives for the proposed anesthesia with the patient or authorized representative who has indicated his/her understanding and acceptance.   Dental advisory given  Plan Discussed with: CRNA  Anesthesia Plan Comments: (Plan LMA versus OETT. )       Anesthesia Quick Evaluation

## 2012-05-28 NOTE — OR Nursing (Addendum)
Hypo CBG: 67      Treatment: D50 IV 25 mL  Symptoms: None  Follow-up CBG: Time:1600 CBG Result:81  Possible Reasons for Event: Other: Surgery  Comments/MD notified:Denneny    Azzie Roup, Ninfa Meeker

## 2012-05-28 NOTE — Progress Notes (Signed)
Pt CBG 63 12.5 mg Dextrose given IV. Care link here to take pt to [facility] for surgery. Report given. Tele. Monitor off. Husband at bedside.

## 2012-05-28 NOTE — Progress Notes (Signed)
CBG: 59  Treatment: D50 IV 25 mL  Symptoms: None  Follow-up CBG: [id number] CBG Result:90  Possible Reasons for Event: Other: NPO  Comments/MD notified:yes    Kember Boch S

## 2012-05-28 NOTE — Progress Notes (Signed)
[facility] PROGRESS NOTE  [name] [id number] DOB: 04/18/1961 DOA: 05/21/2012 PCP: [name], MD  Assessment/Plan: 1. Left hydronephrosis: Management per urology. Now status post left ureteral stent placement. 2. Acute on chronic kidney disease stage III: Management per nephrology. 3. Acute on chronic anemia: Stable. Status post 2 units packed red blood cells 6/25. No acute blood loss. 4. Nonischemic cardiomyopathy: Last ejection fraction 35-40% May 2013. Compensated at this time. 5. History of hidradenitis suppurativa: Stable. No role for MI X. Currently. 6. Diabetes mellitus type 2: Hypoglycemia this morning and early afternoon while n.p.o. Lantus currently held. May need to restart as diet resumed. Sliding-scale insulin. 7. Morbid obesity: Stable.  Code Status: Full code Family Communication: Discussed with husband at bedside Disposition Plan: Pending further evaluation and treatment.  [name], MD  [facility] Pager [phone]. If 8PM-8AM, please contact night-coverage at [email or web address], password [facility] 05/28/2012, 5:32 PM  LOS: 7 days   Brief narrative: 51yo w/ a complex past medical history who presented with recurrent shortness of breath worse with exertion. She had similar complaints in May when she was hospitalized. She stated that she had not been taking her meds routinely since discharge as she was preoccupied with a project she had been working on. In the ED she was found to have acute on chronic anemia with hemoglobin of 7.1g/dl from a baseline of 9.8 a month ago. She also had a bump in creatinine from a baseline of 2.4 to 4.06.   Pt was transferred to [facility] on 6/27 to undergo perc nephrostomy (for hydronephrosis with stones) as well as placement of a tunneled HD cath. She was unable to tolerate either procedure due to SOB w/ horizontal positioning, therefore a L IJ HD cath was placed.   HPI/Subjective: No complaints at this point.  Afebrile, vital signs stable.  Objective: Filed Vitals:   05/28/12 1600 05/28/12 1615 05/28/12 1620 05/28/12 1704  BP: 125/55 125/61  144/63  Pulse: 70 67 67 69  Temp:  97.6 F (36.4 C)  97.5 F (36.4 C)  TempSrc:    Oral  Resp: 13 12 12 19   Height:      Weight:      SpO2: 100% 100% 100% 100%    Intake/Output Summary (Last 24 hours) at 05/28/12 1732 Last data filed at 05/28/12 1625  Gross per 24 hour  Intake    440 ml  Output   2700 ml  Net  -2260 ml    Exam:   General:  Appears calm and comfortable. Nontoxic.  Cardiovascular: Regular rate and rhythm. No murmur, rub, gallop.  Respiratory: Clear to auscultation bilaterally. No wheezes, rales, rhonchi. Normal respiratory effort.  Data Reviewed: Basic Metabolic Panel:  Lab [id number] 05/27/12 0707 05/26/12 0415 05/25/12 0500 05/24/12 0855  NA 140 140 138 138 137  K 4.2 4.3 4.0 4.1 4.8  CL 103 105 104 104 106  CO2 20 21 18* 18* 15*  GLUCOSE 65* 67* 108* 111* 77  BUN 80* 77* 71* 69* 78*  CREATININE 6.04* 6.22* 5.58* 5.14* 5.57*  CALCIUM 7.7* 8.0* 7.4* 7.5* 8.1*  MG -- -- -- -- --  PHOS 8.3* 8.1* -- 6.6* 7.5*   Liver Function Tests:  Lab 05/28/12 0615 05/27/12 0707 05/25/12 0500 05/24/12 0855 05/23/12 0415 05/22/12 0615  AST -- -- -- -- 6 6  ALT -- -- -- -- <5 <5  ALKPHOS -- -- -- -- 146* 148*  BILITOT -- -- -- -- 0.3 0.3  PROT -- -- -- --  8.3 8.6*  ALBUMIN 2.0* 2.0* 1.7* 1.9* 2.0* --   CBC:  Lab 05/27/12 0707 05/26/12 0415 05/25/12 0500 05/24/12 0855 05/23/12 0415  WBC 16.1* 14.5* 17.9* 15.9* 16.7*  NEUTROABS -- -- -- -- --  HGB 8.5* 8.3* 8.0* 8.7* 8.2*  HCT 28.2* 26.6* 26.4* 28.9* 27.7*  MCV 82.2 81.6 80.7 82.1 83.4  PLT 384 355 332 381 371   CBG:  Lab 05/28/12 1659 05/28/12 1601 05/28/12 1525 05/28/12 1501 05/28/12 1342  GLUCAP 68* 81 67* 75 73   Recent Results (from the past 240 hour(s))  WOUND CULTURE     Status: Normal   Collection Time   05/21/12  9:13 PM      Component Value Range  Status Comment   Specimen Description OTHER   Final    Special Requests NONE   Final    Gram Stain     Final    Value: NO WBC SEEN     FEW SQUAMOUS EPITHELIAL CELLS PRESENT     FEW GRAM POSITIVE COCCI IN PAIRS     FEW GRAM NEGATIVE COCCOBACILLI   Culture     Final    Value: MODERATE GROUP B STREP(S.AGALACTIAE)ISOLATED     Note: TESTING AGAINST S. AGALACTIAE NOT ROUTINELY PERFORMED DUE TO PREDICTABILITY OF AMP/PEN/VAN SUSCEPTIBILITY.   Report Status 05/24/2012 FINAL   Final   URINE CULTURE     Status: Normal   Collection Time   05/22/12  4:52 PM      Component Value Range Status Comment   Specimen Description URINE, CATHETERIZED   Final    Special Requests NONE   Final    Culture  Setup Time [id number]   Final    Colony Count NO GROWTH   Final    Culture NO GROWTH   Final    Report Status 05/23/2012 FINAL   Final   MRSA PCR SCREENING     Status: Normal   Collection Time   05/23/12  6:07 PM      Component Value Range Status Comment   MRSA by PCR NEGATIVE  NEGATIVE Final      Studies: Imaging since admission reviewed.  Scheduled Meds:   . aspirin EC  81 mg Oral Daily  . carvedilol  6.25 mg Oral BID WC  . dextrose  12.5 g Intravenous Once  . dextrose  12.5 g Intravenous Once  . dextrose      . DULoxetine  60 mg Oral Daily  . enoxaparin (LOVENOX) injection  30 mg Subcutaneous Q24H  . ferrous sulfate  325 mg Oral BID  . folic acid  1 mg Oral Daily  . furosemide  120 mg Intravenous TID  . hydrALAZINE  37.5 mg Oral TID  . insulin aspart  0-5 Units Subcutaneous QHS  . insulin aspart  0-9 Units Subcutaneous TID WC  . isosorbide mononitrate  30 mg Oral Daily  . levothyroxine  25 mcg Oral QAC breakfast  . LORazepam  0.5-1 mg Intravenous Once  . senna  1 tablet Oral BID  . simvastatin  20 mg Oral q1800  . sodium bicarbonate  1,300 mg Oral BID  . sodium chloride  3 mL Intravenous Q12H  . ascorbic acid  500 mg Oral Daily  . zinc sulfate  220 mg Oral Daily  . DISCONTD:  ciprofloxacin  200 mg Intravenous 60 min Pre-Op  . DISCONTD: insulin glargine  35 Units Subcutaneous QHS   Continuous Infusions:   . sodium chloride 500 mL (05/28/12 1314)  Principal Problem:  *Acute renal failure Active Problems:  Hydronephrosis of left kidney  Nephrolithiasis  Anemia  CKD (chronic kidney disease), stage III  Hidradenitis suppurativa  Obesity (BMI 35.0-39.9 without comorbidity)  Diabetes mellitus type 2 in obese

## 2012-05-28 NOTE — Progress Notes (Signed)
Pt cbg 78 after eating dinner and a snack,. Npo all day. Juice given to pt.

## 2012-05-29 ENCOUNTER — Encounter (HOSPITAL_COMMUNITY): Payer: Self-pay | Admitting: Urology

## 2012-05-29 DIAGNOSIS — I5023 Acute on chronic systolic (congestive) heart failure: Secondary | ICD-10-CM

## 2012-05-29 LAB — GLUCOSE, CAPILLARY
Glucose-Capillary: 69 mg/dL — ABNORMAL LOW (ref 70–99)
Glucose-Capillary: 121 mg/dL — ABNORMAL HIGH (ref 70–99)

## 2012-05-29 LAB — RENAL FUNCTION PANEL
Albumin: 2 g/dL — ABNORMAL LOW (ref 3.5–5.2)
BUN: 81 mg/dL — ABNORMAL HIGH (ref 6–23)
Chloride: 102 mEq/L (ref 96–112)
Creatinine, Ser: 6.19 mg/dL — ABNORMAL HIGH (ref 0.50–1.10)
Glucose, Bld: 79 mg/dL (ref 70–99)
Phosphorus: 8.5 mg/dL — ABNORMAL HIGH (ref 2.3–4.6)

## 2012-05-29 MED ORDER — BOOST / RESOURCE BREEZE PO LIQD: 1.0000 | Freq: Every day | ORAL | Status: DC | PRN

## 2012-05-29 MED ORDER — SODIUM CHLORIDE 0.45 % IV SOLN
INTRAVENOUS | Status: DC
  Administered 2012-05-29 – 2012-06-03 (×8): via INTRAVENOUS

## 2012-05-29 NOTE — Progress Notes (Signed)
NO flank pain Mild frequency Afebrile Cr still elevated today I would like a KUB tomorrow to assess residual stones/stent

## 2012-05-29 NOTE — Progress Notes (Signed)
Pt had a 5 beat run of V-tach non sustained.  Checked on patient and she was asleep and had not complaints.  Will continue to monitor.  MD notified.  M. Dineen Kid, RN

## 2012-05-29 NOTE — Progress Notes (Signed)
Subjective: No new issues.   HPI:  51 year old with history of systolic CHF, HTN, DM 2, Morbid obesity, CKD 3, chronic iron deficiency anemia, hypothyroidism, admitted to [facility] 05/21/12, for exertional dyspnea attributable to acute CHF decompensation. In the ED she was found to have acute on chronic anemia with hemoglobin of 7.1 g/dl, from a baseline of 9.8 a month ago. She also had a bump in creatinine from a baseline of 2.4 to 4.06. She was eventually found to have a left hydronephrosis and in the face of deteriorating renal function, was subsequently transferred to [facility] on 04/28/12 to undergo percutaneous nephrostomy as well as placement of a tunneled HD cath. She was unable to tolerate either procedure due to SOB on horizontal positioning, therefore a L IJ HD cath was placed.    Objective: Vital signs in last 24 hours: Temp:  [97 F (36.1 C)-98 F (36.7 C)] 98 F (36.7 C) (07/03 0941) Pulse Rate:  [67-78] 78  (07/03 0941) Resp:  [12-19] 18  (07/03 0941) BP: (96-144)/(49-63) 132/55 mmHg (07/03 0941) SpO2:  [93 %-100 %] 93 % (07/03 0941) Weight:  [163.295 kg (360 lb)] 163.295 kg (360 lb) (07/02 2140) Weight change: -1.405 kg (-3 lb 1.6 oz) Last BM Date: 06/23/12  Intake/Output from previous day: 07/02 0701 - 07/03 0700 In: 630 [P.O.:120; I.V.:200; IV Piggyback:310] Out: 700 [Urine:700] Total I/O In: 240 [P.O.:240] Out: 2150 [Urine:2150]   Physical Exam: General: Comfortable, in chair, alert, communicative, fully oriented, not short of breath at rest.  HEENT: Moderate clinical pallor, no jaundice, no conjunctival injection or discharge. Hydration status is fair.  NECK: Supple, JVP not seen, no carotid bruits, no palpable lymphadenopathy, no palpable goiter.  CHEST: Clinically clear to auscultation, no wheezes, no crackles.  HEART: Sounds 1 and 2 heard, normal, regular, no murmurs.  ABDOMEN: Morbidly obese, soft, non-tender. Unable to examine  properly/fully, as patient is sitting in chair.  GENITALIA: Not examined.  LOWER EXTREMITIES: No pitting edema, palpable peripheral pulses.  MUSCULOSKELETAL SYSTEM: Unremarkable.  CENTRAL NERVOUS SYSTEM: No focal neurologic deficit on gross examination.  Lab Results:  [facility] 05/27/12 0707  WBC 16.1*  HGB 8.5*  HCT 28.2*  PLT 384    Basename 05/29/12 0600 05/28/12 0615  NA 139 140  K 4.3 4.2  CL 102 103  CO2 21 20  GLUCOSE 79 65*  BUN 81* 80*  CREATININE 6.19* 6.04*  CALCIUM 7.7* 7.7*   Recent Results (from the past 240 hour(s))  WOUND CULTURE     Status: Normal   Collection Time   05/21/12  9:13 PM      Component Value Range Status Comment   Specimen Description OTHER   Final    Special Requests NONE   Final    Gram Stain     Final    Value: NO WBC SEEN     FEW SQUAMOUS EPITHELIAL CELLS PRESENT     FEW GRAM POSITIVE COCCI IN PAIRS     FEW GRAM NEGATIVE COCCOBACILLI   Culture     Final    Value: MODERATE GROUP B STREP(S.AGALACTIAE)ISOLATED     Note: TESTING AGAINST S. AGALACTIAE NOT ROUTINELY PERFORMED DUE TO PREDICTABILITY OF AMP/PEN/VAN SUSCEPTIBILITY.   Report Status 05/24/2012 FINAL   Final   URINE CULTURE     Status: Normal   Collection Time   05/22/12  4:52 PM      Component Value Range Status Comment   Specimen Description URINE, CATHETERIZED  Final    Special Requests NONE   Final    Culture  Setup Time [id number]   Final    Colony Count NO GROWTH   Final    Culture NO GROWTH   Final    Report Status 05/23/2012 FINAL   Final   MRSA PCR SCREENING     Status: Normal   Collection Time   05/23/12  6:07 PM      Component Value Range Status Comment   MRSA by PCR NEGATIVE  NEGATIVE Final      Studies/Results: No results found.  Medications: Scheduled Meds:   . aspirin EC  81 mg Oral Daily  . carvedilol  6.25 mg Oral BID WC  . DULoxetine  60 mg Oral Daily  . enoxaparin (LOVENOX) injection  30 mg Subcutaneous Q24H  . ferrous sulfate  325 mg Oral BID   . folic acid  1 mg Oral Daily  . hydrALAZINE  37.5 mg Oral TID  . insulin aspart  0-5 Units Subcutaneous QHS  . insulin aspart  0-9 Units Subcutaneous TID WC  . isosorbide mononitrate  30 mg Oral Daily  . levothyroxine  25 mcg Oral QAC breakfast  . LORazepam  0.5-1 mg Intravenous Once  . senna  1 tablet Oral BID  . simvastatin  20 mg Oral q1800  . sodium bicarbonate  1,300 mg Oral BID  . sodium chloride  3 mL Intravenous Q12H  . ascorbic acid  500 mg Oral Daily  . zinc sulfate  220 mg Oral Daily  . DISCONTD: ciprofloxacin  200 mg Intravenous 60 min Pre-Op  . DISCONTD: furosemide  120 mg Intravenous TID   Continuous Infusions:   . sodium chloride 75 mL/hr at 05/29/12 1327  . DISCONTD: sodium chloride 500 mL (05/28/12 1314)   PRN Meds:.acetaminophen, acetaminophen, cyclobenzaprine, dextrose, feeding supplement, heparin, morphine injection, ondansetron (ZOFRAN) IV, ondansetron, oxyCODONE-acetaminophen, phenol, DISCONTD: fentaNYL, DISCONTD: iohexol, DISCONTD: promethazine, DISCONTD: sodium chloride irrigation  Assessment/Plan:  Active Problems:  1. Nonischemic cardiomyopathy:  Patient presented with acute on chronic systolic CHF secondary to volume overload from kidney and urologic dysfunction and noncompliance with meds Last ejection fraction 35-40% May 2013. Cardiology consultation was provided by Dr Daneen Schick, and patient was subsequently seen by Dr Fransico Him. Managed with diuretics, but eventually required HD to control volume status. Today, patient appears clinically compensated.   2. Acute on chronic kidney disease stage III:  This is multifactorial, progressive CKD related to poorly controlled chronic CHF.  Patient now with hydronephrosis and hydroureter with CT evidence of multiple renal calculi bilaterally.  Management per nephrology. Dr Marval Regal provided nephrology consultation.  3. Left hydronephrosis:  As described above, imaging studies demonstrated obstructive  uropathy/left hydronephrosis, secondary to calculi. Management per urology. S/P cystoscopic retrograde ureterogram and stent on 05/28/12 with good urine output.  Consultation was provided by Dr Nicki Reaper McDarmid. Managing per Urology.  4. Acute on chronic anemia:  Patient had a profound anemia on presentation, with HB 7.1 (Baseline 9.8 in May 2013). She is status post transfusion of 2 units packed red blood cells 05/21/12, and hemoglobin is now stable/reasonable at 8.5 as of 05/27/12.  5. History of hidradenitis suppurativa:  Patient has a long history of hydradinitis suppurativa,  Against a background of morbid obesity and followed by Dr. Tommy Medal on antibiotics. She had been referred to plastic surgery at [facility] and was told that definitive therapy can be accomplished but will require about 1 year or so. Dr Scharlene Gloss has  seen patient on this occasion, and recommended discontinuation of antibiotics and possible surgical drainage.  6. Diabetes mellitus type 2:  This is insulin-requiring. Patient has been managed with diet, SSI and Lantus during this hospitalization, with adequate control, but Lantus was discontinued on 05/28/12, due to Hypoglycemia, while n.p.o. Lantus currently held. CBGs are reasonable, today.  7. Morbid obesity: Stable.   LOS: 8 days   [name],CHRISTOPHER 05/29/2012, 2:02 PM

## 2012-05-29 NOTE — Progress Notes (Signed)
Assessment/Plan:  1. AKI/CKD- multifactorial. progressive CKD related to poorly controlled chronic CHF. Found to be profoundly anemic. Pt now with hydronephrosis and hydroureter with CT evidence of multiple renal calculi bilaterally.S/P cysto RGP and stent with good uop 2. Hydradenitis suppurative- agree with ID eval and hold off vanco dose due to AKI and will likely need surgical drainage. 3. DM- per primary svc  Plan- Hold Furosemide, 1 liter ivf.   Subjective: Interval History: Back pain resolved  Objective: Vital signs in last 24 hours: Temp:  [97 F (36.1 C)-98 F (36.7 C)] 98 F (36.7 C) (07/03 0941) Pulse Rate:  [67-78] 78  (07/03 0941) Resp:  [12-19] 18  (07/03 0941) BP: (96-144)/(49-63) 132/55 mmHg (07/03 0941) SpO2:  [93 %-100 %] 93 % (07/03 0941) Weight:  [163.295 kg (360 lb)] 163.295 kg (360 lb) (07/02 2140) Weight change: -1.405 kg (-3 lb 1.6 oz)  Intake/Output from previous day: 07/02 0701 - 07/03 0700 In: 630 [P.O.:120; I.V.:200; IV Piggyback:310] Out: 700 [Urine:700] Intake/Output this shift: Total I/O In: 240 [P.O.:240] Out: 1500 [Urine:1500]  General appearance: alert, cooperative and appears stated age Lung clear Obese Corrr Abd soft Ext 1+  Lab Results:  Basename 05/27/12 0707  WBC 16.1*  HGB 8.5*  HCT 28.2*  PLT 384   BMET:  Basename 05/29/12 0600 05/28/12 0615  NA 139 140  K 4.3 4.2  CL 102 103  CO2 21 20  GLUCOSE 79 65*  BUN 81* 80*  CREATININE 6.19* 6.04*  CALCIUM 7.7* 7.7*   No results found for this basename: PTH:2 in the last 72 hours Iron Studies: No results found for this basename: IRON,TIBC,TRANSFERRIN,FERRITIN in the last 72 hours Studies/Results: No results found.  Scheduled:   . aspirin EC  81 mg Oral Daily  . carvedilol  6.25 mg Oral BID WC  . dextrose  12.5 g Intravenous Once  . dextrose  12.5 g Intravenous Once  . DULoxetine  60 mg Oral Daily  . enoxaparin (LOVENOX) injection  30 mg Subcutaneous Q24H  . ferrous  sulfate  325 mg Oral BID  . folic acid  1 mg Oral Daily  . furosemide  120 mg Intravenous TID  . hydrALAZINE  37.5 mg Oral TID  . insulin aspart  0-5 Units Subcutaneous QHS  . insulin aspart  0-9 Units Subcutaneous TID WC  . isosorbide mononitrate  30 mg Oral Daily  . levothyroxine  25 mcg Oral QAC breakfast  . LORazepam  0.5-1 mg Intravenous Once  . senna  1 tablet Oral BID  . simvastatin  20 mg Oral q1800  . sodium bicarbonate  1,300 mg Oral BID  . sodium chloride  3 mL Intravenous Q12H  . ascorbic acid  500 mg Oral Daily  . zinc sulfate  220 mg Oral Daily  . DISCONTD: ciprofloxacin  200 mg Intravenous 60 min Pre-Op       LOS: 8 days   Acey Woodfield C 05/29/2012,12:51 PM

## 2012-05-29 NOTE — Progress Notes (Signed)
INITIAL ADULT NUTRITION ASSESSMENT Date: 05/29/2012   Time: 10:14 AM  Reason for Assessment: Health History  ASSESSMENT: Female 51 y.o.  Dx: Acute renal failure  Hx:  Past Medical History  Diagnosis Date  . Systolic congestive heart failure   . SOB (shortness of breath)   . HTN (hypertension)   . DM type 2 (diabetes mellitus, type 2)   . Chronic kidney disease (CKD), stage III (moderate)   . Iron deficiency anemia   . Morbid obesity   . Hyperkalemia   . Hypothyroidism   . CHF (congestive heart failure)    Past Surgical History  Procedure Date  . Portacath placement    Related Meds:     . aspirin EC  81 mg Oral Daily  . carvedilol  6.25 mg Oral BID WC  . dextrose  12.5 g Intravenous Once  . dextrose  12.5 g Intravenous Once  . dextrose      . DULoxetine  60 mg Oral Daily  . enoxaparin (LOVENOX) injection  30 mg Subcutaneous Q24H  . ferrous sulfate  325 mg Oral BID  . folic acid  1 mg Oral Daily  . furosemide  120 mg Intravenous TID  . hydrALAZINE  37.5 mg Oral TID  . insulin aspart  0-5 Units Subcutaneous QHS  . insulin aspart  0-9 Units Subcutaneous TID WC  . isosorbide mononitrate  30 mg Oral Daily  . levothyroxine  25 mcg Oral QAC breakfast  . LORazepam  0.5-1 mg Intravenous Once  . senna  1 tablet Oral BID  . simvastatin  20 mg Oral q1800  . sodium bicarbonate  1,300 mg Oral BID  . sodium chloride  3 mL Intravenous Q12H  . ascorbic acid  500 mg Oral Daily  . zinc sulfate  220 mg Oral Daily  . DISCONTD: ciprofloxacin  200 mg Intravenous 60 min Pre-Op   Ht: 5\' 6"  (167.6 cm)  Wt: 360 lb (163.295 kg) 161.4 kg s/p HD on 6/28  Ideal Wt: 59.1 kg % Ideal Wt: 276%  Wt Readings from Last 15 Encounters:  05/28/12 360 lb (163.295 kg)  05/28/12 360 lb (163.295 kg)  05/23/12 350 lb (158.759 kg)  04/07/12 379 lb 13.6 oz (172.3 kg)  03/07/12 338 lb (153.316 kg)  01/30/12 357 lb 12.9 oz (162.3 kg)  08/03/11 340 lb (154.223 kg)  07/26/11 340 lb (154.223 kg)    05/22/11 320 lb 4 oz (145.264 kg)  02/08/11 277 lb 1.9 oz (125.7 kg)  01/23/11 293 lb (132.904 kg)  12/07/10 284 lb (128.822 kg)  11/10/10 268 lb 3.2 oz (121.655 kg)  10/13/10 331 lb (150.141 kg)  07/27/10 359 lb (162.841 kg)  Usual Wt: 350 lb (per pt) % Usual Wt: 103%  Body mass index is 58.11 kg/(m^2). Pt is obese, class II (Extreme Obesity)  Food/Nutrition Related Hx: Low Sodium diet PTA  Labs:  CMP     Component Value Date/Time   NA 139 05/29/2012 0600   K 4.3 05/29/2012 0600   CL 102 05/29/2012 0600   CO2 21 05/29/2012 0600   GLUCOSE 79 05/29/2012 0600   BUN 81* 05/29/2012 0600   CREATININE 6.19* 05/29/2012 0600   CREATININE 1.90* 03/07/2012 1652   CALCIUM 7.7* 05/29/2012 0600   PROT 8.3 05/23/2012 0415   ALBUMIN 2.0* 05/29/2012 0600   AST 6 05/23/2012 0415   ALT <5 05/23/2012 0415   ALKPHOS 146* 05/23/2012 0415   BILITOT 0.3 05/23/2012 0415   GFRNONAA 7* 05/29/2012 0600  GFRAA 8* 05/29/2012 0600    Phosphorus  Date/Time Value Range Status  05/29/2012  6:00 AM 8.5* 2.3 - 4.6 mg/dL Final  05/28/2012  6:15 AM 8.3* 2.3 - 4.6 mg/dL Final  05/27/2012  7:07 AM 8.1* 2.3 - 4.6 mg/dL Final   Potassium  Date/Time Value Range Status  05/29/2012  6:00 AM 4.3  3.5 - 5.1 mEq/L Final  05/28/2012  6:15 AM 4.2  3.5 - 5.1 mEq/L Final  05/27/2012  7:07 AM 4.3  3.5 - 5.1 mEq/L Final   CBG (last 3)   Basename 05/29/12 0812 05/29/12 0731 05/28/12 2136  GLUCAP 106* 69* 122*    Lab Results  Component Value Date   HGBA1C 8.5* 01/20/2012    Intake/Output Summary (Last 24 hours) at 05/29/12 1017 Last data filed at 05/29/12 0900  Gross per 24 hour  Intake    870 ml  Output   1500 ml  Net   -630 ml   Diet Order: Renal 60 - 70  Supplements/Tube Feeding: none  IVF:    DISCONTD: sodium chloride Last Rate: 500 mL (05/28/12 1314)   Estimated Nutritional Needs:   Kcal: 2000 - 2200 kcal Protein: 105 - 120 grams protein Fluid:  1.5 - 1.8 liter  Pt admitted to [facility] with SOB x 2 weeks. Work-up revealed CHF  exacerbation. Pt with hidradenitis suppurativa, DM2, and CKD III at baseline. Noted [facility] consulted for hidradenitis suppurativa, however wound RN reports that this is beyond [facility] scope of practice for treatment and topical wound care is not effective. Pt has declined a surgical consult for this issue.  Renal work-up reveals pt with progressive CKD related to poorly controlled chronic CHF. Transferred to [facility] on 6/27 for oliguria, persistent hyperkalemia (despite meds), worsening acidosis; renal recommended possible HD vs percutaneous nephrostomy tube.  Radiology placed temporary Trialysis catheter on 6/27, pt could not tolerate horizontal positioning for perc nephrostomy or tunneled HD catheter at that time.  HD on 6/28. CT on 6/29 revealed multiple renal calculi bilaterally. Per urology, pt needs left percutaneous nephrostomy to decompress the left kidney vs JJ stent placement.  Underwent cytoscopy with retrograde pyelogram/ureteral stent placement on 7/2.   Pt with medical noncompliance. Pt states that she follows a low sodium diet at home. Pt complaining about diet order and stating that she has very limited options, however per document flowsheets she is eating 75 - 100% of meals. Pt states that appetite PTA was WNL. Discussed option of adding scheduled oral nutritional supplement (such as Nepro) however pt declined.  NUTRITION DIAGNOSIS: Inadequate protein energy intake  RELATED TO: diet prescription and decline of scheduled supplements  AS EVIDENCED BY: estimated needs and diet protein provision  MONITORING/EVALUATION(Goals): Goal: Pt to meet >/= 90% of their estimated nutrition needs; not met Monitor: weights, labs, PO intake, renal function  EDUCATION NEEDS: -Education not appropriate at this time  INTERVENTION: 1. Recommend Renal 80 - 90 diet to help better meet protein needs 2. RD to add Resource Breeze PO PRN to help meet protein needs 3. RD to continue to follow and assess need  for further interventions  DOCUMENTATION CODES Per approved criteria  -Morbid Obesity   Inda Coke MS, RD, LDN Pager: [phone] After-hours pager: [phone]

## 2012-05-30 ENCOUNTER — Inpatient Hospital Stay (HOSPITAL_COMMUNITY): Payer: BC Managed Care – PPO

## 2012-05-30 LAB — GLUCOSE, CAPILLARY
Glucose-Capillary: 82 mg/dL (ref 70–99)
Glucose-Capillary: 112 mg/dL — ABNORMAL HIGH (ref 70–99)
Glucose-Capillary: 120 mg/dL — ABNORMAL HIGH (ref 70–99)

## 2012-05-30 LAB — COMPREHENSIVE METABOLIC PANEL
ALT: 5 U/L (ref 0–35)
AST: 11 U/L (ref 0–37)
Albumin: 2 g/dL — ABNORMAL LOW (ref 3.5–5.2)
Chloride: 102 mEq/L (ref 96–112)
Creatinine, Ser: 5.84 mg/dL — ABNORMAL HIGH (ref 0.50–1.10)
Potassium: 4.3 mEq/L (ref 3.5–5.1)
Sodium: 141 mEq/L (ref 135–145)
Total Bilirubin: 0.3 mg/dL (ref 0.3–1.2)

## 2012-05-30 LAB — CBC
MCV: 82.2 fL (ref 78.0–100.0)
Platelets: 336 10*3/uL (ref 150–400)
RBC: 3.37 MIL/uL — ABNORMAL LOW (ref 3.87–5.11)
RDW: 20.9 % — ABNORMAL HIGH (ref 11.5–15.5)
WBC: 14 10*3/uL — ABNORMAL HIGH (ref 4.0–10.5)

## 2012-05-30 LAB — PHOSPHORUS: Phosphorus: 8.1 mg/dL — ABNORMAL HIGH (ref 2.3–4.6)

## 2012-05-30 MED ORDER — RACEPINEPHRINE HCL 2.25 % IN NEBU
0.5000 mL | INHALATION_SOLUTION | Freq: Once | RESPIRATORY_TRACT | Status: DC
  Filled 2012-05-30: qty 0.5

## 2012-05-30 NOTE — Progress Notes (Signed)
Patient ID: [name], female   DOB: [birth year], 51 y.o.   MRN: [id number] 2 Days Post-Op  Subjective: [name] is doing well with the stent and denies pain.   Her UOP is excellent and her Cr is falling.  She is for KUB today but that has not been done. ROS: Negative except as above  Objective: Vital signs in last 24 hours: Temp:  [97.8 F (36.6 C)-98.5 F (36.9 C)] 98.2 F (36.8 C) (07/04 0539) Pulse Rate:  [60-78] 70  (07/04 0539) Resp:  [18-20] 20  (07/04 0539) BP: (118-155)/(55-91) 155/91 mmHg (07/04 0539) SpO2:  [93 %-99 %] 98 % (07/04 0539) Weight:  [163.2 kg (359 lb 12.7 oz)] 163.2 kg (359 lb 12.7 oz) (07/03 2026)  Intake/Output from previous day: 07/03 0701 - 07/04 0700 In: 2085 [P.O.:960; I.V.:1125] Out: 3825 [Urine:3825] Intake/Output this shift:    General appearance: alert and no distress urine in bag is light pink.  Lab Results:   Basename 05/30/12 0600  WBC 14.0*  HGB 8.4*  HCT 27.7*  PLT 336   BMET  Basename 05/30/12 0600 05/29/12 0600  NA 141 139  K 4.3 4.3  CL 102 102  CO2 21 21  GLUCOSE 87 79  BUN 81* 81*  CREATININE 5.84* 6.19*  CALCIUM 7.3* 7.7*   PT/INR No results found for this basename: LABPROT:2,INR:2 in the last 72 hours ABG No results found for this basename: PHART:2,PCO2:2,PO2:2,HCO3:2 in the last 72 hours  Studies/Results: No results found.  Anti-infectives: Anti-infectives     Start     Dose/Rate Route Frequency Ordered Stop   05/28/12 1417   ciprofloxacin (CIPRO) IVPB 200 mg  Status:  Discontinued        200 mg 100 mL/hr over 60 Minutes Intravenous 60 min pre-op 05/28/12 1418 05/28/12 1649   05/23/12 2200   levofloxacin (LEVAQUIN) IVPB 750 mg  Status:  Discontinued        750 mg 100 mL/hr over 90 Minutes Intravenous Every 48 hours 05/21/12 2231 05/22/12 1559   05/23/12 2000   vancomycin (VANCOCIN) 2,000 mg in sodium chloride 0.9 % 500 mL IVPB  Status:  Discontinued        2,000 mg 250 mL/hr over 120 Minutes  Intravenous Every 48 hours 05/21/12 2231 05/22/12 1206   05/23/12 1342   ciprofloxacin (CIPRO) IVPB 400 mg  Status:  Discontinued     Comments: GIVE ON CALL TO IR 6/27 FOR HD CATH/NEPHROSTOMY TUBE PLACEMENT      400 mg 200 mL/hr over 60 Minutes Intravenous 30 min pre-op 05/23/12 1333 05/23/12 1731   05/22/12 0000   clindamycin (CLEOCIN) IVPB 600 mg  Status:  Discontinued        600 mg 100 mL/hr over 30 Minutes Intravenous 4 times per day 05/21/12 2205 05/22/12 1559   05/21/12 2315   vancomycin (VANCOCIN) 2,500 mg in sodium chloride 0.9 % 500 mL IVPB        2,500 mg 250 mL/hr over 120 Minutes Intravenous  Once 05/21/12 2224 05/22/12 0806   05/21/12 2245   levofloxacin (LEVAQUIN) IVPB 750 mg        750 mg 100 mL/hr over 90 Minutes Intravenous  Once 05/21/12 2219 05/22/12 0505   05/21/12 1600   cefOXitin (MEFOXIN) 1 g in dextrose 5 % 50 mL IVPB        1 g 100 mL/hr over 30 Minutes Intravenous To Emergency Dept 05/21/12 1517 05/21/12 2152   05/21/12 1600   vancomycin (VANCOCIN)  IVPB 1000 mg/200 mL premix  Status:  Discontinued        1,000 mg 200 mL/hr over 60 Minutes Intravenous To Emergency Dept 05/21/12 1517 05/21/12 2224          Current Facility-Administered Medications  Medication Dose Route Frequency Provider Last Rate Last Dose  . 0.45 % sodium chloride infusion   Intravenous Continuous Estanislado Emms, MD 75 mL/hr at 05/30/12 0138    . acetaminophen (TYLENOL) tablet 650 mg  650 mg Oral Q6H PRN Simbiso Ranga, MD   650 mg at 05/24/12 2026   Or  . acetaminophen (TYLENOL) suppository 650 mg  650 mg Rectal Q6H PRN Simbiso Ranga, MD      . aspirin EC tablet 81 mg  81 mg Oral Daily Simbiso Ranga, MD   81 mg at 05/29/12 1325  . carvedilol (COREG) tablet 6.25 mg  6.25 mg Oral BID WC Simbiso Ranga, MD   6.25 mg at 05/29/12 1854  . cyclobenzaprine (FLEXERIL) tablet 10 mg  10 mg Oral TID PRN Nat Math, MD   10 mg at 05/21/12 2035  . DULoxetine (CYMBALTA) DR capsule 60 mg  60 mg  Oral Daily Cherene Altes, MD   60 mg at 05/29/12 1326  . enoxaparin (LOVENOX) injection 30 mg  30 mg Subcutaneous Q24H Donald Prose Runyon, PHARMD   30 mg at 05/29/12 2213  . feeding supplement (RESOURCE BREEZE) liquid 1 Container  1 Container Oral Daily PRN Erlene Quan, RD      . ferrous sulfate tablet 325 mg  325 mg Oral BID Simbiso Ranga, MD   325 mg at 05/29/12 2235  . folic acid (FOLVITE) tablet 1 mg  1 mg Oral Daily Simbiso Ranga, MD   1 mg at 05/29/12 1215  . heparin injection 3,400 Units  20 Units/kg Dialysis PRN Donetta Potts, MD      . hydrALAZINE (APRESOLINE) tablet 37.5 mg  37.5 mg Oral TID Nat Math, MD   37.5 mg at 05/29/12 2211  . insulin aspart (novoLOG) injection 0-5 Units  0-5 Units Subcutaneous QHS Simbiso Ranga, MD      . insulin aspart (novoLOG) injection 0-9 Units  0-9 Units Subcutaneous TID WC Simbiso Ranga, MD   1 Units at 05/29/12 1745  . isosorbide mononitrate (IMDUR) 24 hr tablet 30 mg  30 mg Oral Daily Simbiso Ranga, MD   30 mg at 05/29/12 1326  . levothyroxine (SYNTHROID, LEVOTHROID) tablet 25 mcg  25 mcg Oral QAC breakfast Simbiso Ranga, MD   25 mcg at 05/29/12 0844  . LORazepam (ATIVAN) injection 0.5-1 mg  0.5-1 mg Intravenous Once Nita Sells, MD      . morphine 2 MG/ML injection 2 mg  2 mg Intravenous Q4H PRN Simbiso Ranga, MD   2 mg at 05/25/12 1533  . ondansetron (ZOFRAN) tablet 4 mg  4 mg Oral Q6H PRN Simbiso Ranga, MD       Or  . ondansetron (ZOFRAN) injection 4 mg  4 mg Intravenous Q6H PRN Simbiso Ranga, MD      . oxyCODONE-acetaminophen (PERCOCET) 5-325 MG per tablet 1 tablet  1 tablet Oral Q6H PRN Simbiso Ranga, MD   1 tablet at 05/29/12 1100  . phenol (CHLORASEPTIC) mouth spray 1 spray  1 spray Mouth/Throat PRN Cherene Altes, MD      . senna [facility]) tablet 8.6 mg  1 tablet Oral BID Simbiso Ranga, MD   8.6 mg at 05/29/12 2211  . simvastatin (ZOCOR) tablet 20  mg  20 mg Oral q1800 Simbiso Ranga, MD   20 mg at 05/29/12 1854  .  sodium bicarbonate tablet 1,300 mg  1,300 mg Oral BID Donetta Potts, MD   1,300 mg at 05/29/12 2211  . sodium chloride 0.9 % injection 3 mL  3 mL Intravenous Q12H Simbiso Ranga, MD   3 mL at 05/29/12 2211  . vitamin C (ASCORBIC ACID) tablet 500 mg  500 mg Oral Daily Simbiso Ranga, MD   500 mg at 05/29/12 1325  . zinc sulfate capsule 220 mg  220 mg Oral Daily Simbiso Ranga, MD   220 mg at 05/29/12 1059  . DISCONTD: furosemide (LASIX) 120 mg in dextrose 5 % 50 mL IVPB  120 mg Intravenous TID Donetta Potts, MD   120 mg at 05/29/12 1100    Assessment: s/p Procedure(s): CYSTOSCOPY WITH RETROGRADE PYELOGRAM/URETERAL STENT PLACEMENT  Her Cr is falling and her UOP is excellent.  Plan: KUB today.   LOS: 9 days    Ryver Poblete J 05/30/2012

## 2012-05-30 NOTE — Progress Notes (Signed)
Subjective: No new issues.   HPI:  51 year old with history of systolic CHF, HTN, DM 2, Morbid obesity, CKD 3, chronic iron deficiency anemia, hypothyroidism, admitted to [facility] 05/21/12, for exertional dyspnea attributable to acute CHF decompensation. In the ED she was found to have acute on chronic anemia with hemoglobin of 7.1 g/dl, from a baseline of 9.8 a month ago. She also had a bump in creatinine from a baseline of 2.4 to 4.06. She was eventually found to have a left hydronephrosis and in the face of deteriorating renal function, was subsequently transferred to [facility] on 04/28/12 to undergo percutaneous nephrostomy as well as placement of a tunneled HD cath. She was unable to tolerate either procedure due to SOB on horizontal positioning, therefore a L IJ HD cath was placed.    Objective: Vital signs in last 24 hours: Temp:  [97.8 F (36.6 C)-98.5 F (36.9 C)] 98.2 F (36.8 C) (07/04 0539) Pulse Rate:  [60-73] 70  (07/04 0539) Resp:  [20] 20  (07/04 0539) BP: (118-155)/(56-91) 155/91 mmHg (07/04 0539) SpO2:  [98 %-99 %] 98 % (07/04 0539) Weight:  [163.2 kg (359 lb 12.7 oz)] 163.2 kg (359 lb 12.7 oz) (07/03 2026) Weight change: -0.095 kg (-3.4 oz) Last BM Date: 06/23/12  Intake/Output from previous day: 07/03 0701 - 07/04 0700 In: 2085 [P.O.:960; I.V.:1125] Out: 3825 [Urine:3825]     Physical Exam: General: Pain-free. Sleeping, but easily rousable, alert, communicative, fully oriented, not short of breath at rest.  HEENT: Moderate clinical pallor, no jaundice, no conjunctival injection or discharge. Hydration status is fair.  NECK: Supple, JVP not seen, no carotid bruits, no palpable lymphadenopathy, no palpable goiter.  CHEST: Clinically clear to auscultation, no wheezes, no crackles.  HEART: Sounds 1 and 2 heard, normal, regular, no murmurs.  ABDOMEN: Morbidly obese, soft, non-tender. Skin lesions of Hydradenitis affecting lower abdominal wall/pannus  as well as groin and perineum, are dry, with only minimal exudate.Marland Kitchen  GENITALIA: Not examined.  LOWER EXTREMITIES: No pitting edema, palpable peripheral pulses.  MUSCULOSKELETAL SYSTEM: Unremarkable.  CENTRAL NERVOUS SYSTEM: No focal neurologic deficit on gross examination.  Lab Results:  Basename 05/30/12 0600  WBC 14.0*  HGB 8.4*  HCT 27.7*  PLT 336    Basename 05/30/12 0600 05/29/12 0600  NA 141 139  K 4.3 4.3  CL 102 102  CO2 21 21  GLUCOSE 87 79  BUN 81* 81*  CREATININE 5.84* 6.19*  CALCIUM 7.3* 7.7*   Recent Results (from the past 240 hour(s))  WOUND CULTURE     Status: Normal   Collection Time   05/21/12  9:13 PM      Component Value Range Status Comment   Specimen Description OTHER   Final    Special Requests NONE   Final    Gram Stain     Final    Value: NO WBC SEEN     FEW SQUAMOUS EPITHELIAL CELLS PRESENT     FEW GRAM POSITIVE COCCI IN PAIRS     FEW GRAM NEGATIVE COCCOBACILLI   Culture     Final    Value: MODERATE GROUP B STREP(S.AGALACTIAE)ISOLATED     Note: TESTING AGAINST S. AGALACTIAE NOT ROUTINELY PERFORMED DUE TO PREDICTABILITY OF AMP/PEN/VAN SUSCEPTIBILITY.   Report Status 05/24/2012 FINAL   Final   URINE CULTURE     Status: Normal   Collection Time   05/22/12  4:52 PM      Component Value Range Status Comment  Specimen Description URINE, CATHETERIZED   Final    Special Requests NONE   Final    Culture  Setup Time [id number]   Final    Colony Count NO GROWTH   Final    Culture NO GROWTH   Final    Report Status 05/23/2012 FINAL   Final   MRSA PCR SCREENING     Status: Normal   Collection Time   05/23/12  6:07 PM      Component Value Range Status Comment   MRSA by PCR NEGATIVE  NEGATIVE Final      Studies/Results: No results found.  Medications: Scheduled Meds:    . aspirin EC  81 mg Oral Daily  . carvedilol  6.25 mg Oral BID WC  . DULoxetine  60 mg Oral Daily  . enoxaparin (LOVENOX) injection  30 mg Subcutaneous Q24H  . ferrous  sulfate  325 mg Oral BID  . folic acid  1 mg Oral Daily  . hydrALAZINE  37.5 mg Oral TID  . insulin aspart  0-5 Units Subcutaneous QHS  . insulin aspart  0-9 Units Subcutaneous TID WC  . isosorbide mononitrate  30 mg Oral Daily  . levothyroxine  25 mcg Oral QAC breakfast  . LORazepam  0.5-1 mg Intravenous Once  . senna  1 tablet Oral BID  . simvastatin  20 mg Oral q1800  . sodium bicarbonate  1,300 mg Oral BID  . sodium chloride  3 mL Intravenous Q12H  . ascorbic acid  500 mg Oral Daily  . zinc sulfate  220 mg Oral Daily  . DISCONTD: furosemide  120 mg Intravenous TID   Continuous Infusions:    . sodium chloride 75 mL/hr at 05/30/12 0138   PRN Meds:.acetaminophen, acetaminophen, cyclobenzaprine, feeding supplement, heparin, morphine injection, ondansetron (ZOFRAN) IV, ondansetron, oxyCODONE-acetaminophen, phenol  Assessment/Plan:  Active Problems:  1. Nonischemic cardiomyopathy:  Patient presented with acute on chronic systolic CHF secondary to volume overload from kidney and urologic dysfunction and noncompliance with meds Last ejection fraction 35-40% May 2013. Cardiology consultation was provided by Dr Daneen Schick, and patient was subsequently seen by Dr Fransico Him. Managed with diuretics, but eventually required HD to control volume status. Patient is now clinically compensated.   2. Acute on chronic kidney disease stage III:  This is multifactorial, progressive CKD related to poorly controlled chronic CHF.  Patient now with hydronephrosis and hydroureter with CT evidence of multiple renal calculi bilaterally. Following urinary stenting, creatinine has plateaued, and has finally begun trending down from a high of 6.22 on 05/27/12, to 5.84 today. Management per nephrology. Dr Marval Regal provided nephrology consultation.  3. Left hydronephrosis:  As described above, imaging studies demonstrated obstructive uropathy/left hydronephrosis, secondary to calculi. Management per urology. S/P  cystoscopic retrograde ureterogram and stent on 05/28/12 with good urine output.  Consultation was provided by Dr Nicki Reaper McDarmid. Managing per Urology.  4. Acute on chronic anemia:  Patient had a profound anemia on presentation, with HB 7.1 (Baseline 9.8 in May 2013). She is status post transfusion of 2 units packed red blood cells 05/21/12, and hemoglobin is now stable/reasonable at 8.5 as of 05/27/12.  5. History of hidradenitis suppurativa:  Patient has a long history of hydradinitis suppurativa,  Against a background of morbid obesity and followed by Dr. Tommy Medal on antibiotics. She had been referred to plastic surgery at [facility] and was told that definitive therapy can be accomplished but will require about 1 year or so. Dr Scharlene Gloss has seen patient  on this occasion, and recommended discontinuation of antibiotics and possible surgical drainage in due course.  6. Diabetes mellitus type 2:  This is insulin-requiring. Patient has been managed with diet, SSI and Lantus during this hospitalization, with adequate control, but Lantus was discontinued on 05/28/12, due to Hypoglycemia, while n.p.o. Lantus currently held. CBGs are now reasonable.    LOS: 9 days   Bettyanne Dittman,CHRISTOPHER 05/30/2012, 9:52 AM

## 2012-05-30 NOTE — Progress Notes (Signed)
Assessment/Plan:        1 AKI/CKD- multifactorial. progressive CKD related to poorly controlled hypertension & chronic CHF. Pt now with hydronephrosis and hydroureter with multiple renal calculi bilaterally S/P cysto RGP and L stent with good uop .  Plan- Stop Ascorbic acid as it can cause calcium oxalate stones         - Continue 1/2 NS given degree of urine out put Subjective: Interval History: none.  Objective: Vital signs in last 24 hours: Temp:  [97.8 F (36.6 C)-98.5 F (36.9 C)] 98.2 F (36.8 C) (07/04 0539) Pulse Rate:  [60-73] 70  (07/04 0539) Resp:  [20] 20  (07/04 0539) BP: (118-155)/(56-91) 155/91 mmHg (07/04 0539) SpO2:  [98 %-99 %] 98 % (07/04 0539) Weight:  [163.2 kg (359 lb 12.7 oz)] 163.2 kg (359 lb 12.7 oz) (07/03 2026) Weight change: -0.095 kg (-3.4 oz)  Intake/Output from previous day: 07/03 0701 - 07/04 0700 In: 2085 [P.O.:960; I.V.:1125] Out: 3825 [Urine:3825] Intake/Output this shift:   Alert and cooperative Lungs clear Cor RRR Abd obese Ext tr edema  Lab Results:  Basename 05/30/12 0600  WBC 14.0*  HGB 8.4*  HCT 27.7*  PLT 336   BMET:  Basename 05/30/12 0600 05/29/12 0600  NA 141 139  K 4.3 4.3  CL 102 102  CO2 21 21  GLUCOSE 87 79  BUN 81* 81*  CREATININE 5.84* 6.19*  CALCIUM 7.3* 7.7*   No results found for this basename: PTH:2 in the last 72 hours Iron Studies: No results found for this basename: IRON,TIBC,TRANSFERRIN,FERRITIN in the last 72 hours Studies/Results: Dg Abd 1 View  05/30/2012  *RADIOLOGY REPORT*  Clinical Data: Bleeding from the ureteral stent - - - the patient was performed with graded, and the image is of poor resolution in this large patient  ABDOMEN - 1 VIEW  Comparison: CT abdomen of 05/25/2012  Findings: A left ureteral stent is faintly visualized.  The resolution is very poor in this large patient done portably.  The bowel gas pattern is nonspecific.  IMPRESSION:  1.  Very poor resolution in this large patient done  portably.  No bowel obstruction. 2.  Left ureteral stent is faintly visualized proximally.  Original Report Authenticated By: Joretta Bachelor, M.D.    Scheduled:   . aspirin EC  81 mg Oral Daily  . carvedilol  6.25 mg Oral BID WC  . DULoxetine  60 mg Oral Daily  . enoxaparin (LOVENOX) injection  30 mg Subcutaneous Q24H  . ferrous sulfate  325 mg Oral BID  . folic acid  1 mg Oral Daily  . hydrALAZINE  37.5 mg Oral TID  . insulin aspart  0-5 Units Subcutaneous QHS  . insulin aspart  0-9 Units Subcutaneous TID WC  . isosorbide mononitrate  30 mg Oral Daily  . levothyroxine  25 mcg Oral QAC breakfast  . LORazepam  0.5-1 mg Intravenous Once  . senna  1 tablet Oral BID  . simvastatin  20 mg Oral q1800  . sodium bicarbonate  1,300 mg Oral BID  . sodium chloride  3 mL Intravenous Q12H  . ascorbic acid  500 mg Oral Daily  . zinc sulfate  220 mg Oral Daily  . DISCONTD: furosemide  120 mg Intravenous TID     LOS: 9 days   Alyscia Carmon C 05/30/2012,11:46 AM

## 2012-05-31 DIAGNOSIS — N19 Unspecified kidney failure: Secondary | ICD-10-CM

## 2012-05-31 DIAGNOSIS — I509 Heart failure, unspecified: Secondary | ICD-10-CM

## 2012-05-31 LAB — CBC
HCT: 27 % — ABNORMAL LOW (ref 36.0–46.0)
Hemoglobin: 8.2 g/dL — ABNORMAL LOW (ref 12.0–15.0)
MCH: 25.2 pg — ABNORMAL LOW (ref 26.0–34.0)
MCHC: 30.4 g/dL (ref 30.0–36.0)
MCV: 83.1 fL (ref 78.0–100.0)
Platelets: 331 K/uL (ref 150–400)
RBC: 3.25 MIL/uL — ABNORMAL LOW (ref 3.87–5.11)
RDW: 20.6 % — ABNORMAL HIGH (ref 11.5–15.5)
WBC: 13.9 K/uL — ABNORMAL HIGH (ref 4.0–10.5)

## 2012-05-31 LAB — RENAL FUNCTION PANEL
Albumin: 2.1 g/dL — ABNORMAL LOW (ref 3.5–5.2)
BUN: 82 mg/dL — ABNORMAL HIGH (ref 6–23)
CO2: 20 meq/L (ref 19–32)
Calcium: 7.4 mg/dL — ABNORMAL LOW (ref 8.4–10.5)
Chloride: 101 meq/L (ref 96–112)
Creatinine, Ser: 5.8 mg/dL — ABNORMAL HIGH (ref 0.50–1.10)
GFR calc Af Amer: 9 mL/min — ABNORMAL LOW
GFR calc non Af Amer: 8 mL/min — ABNORMAL LOW
Glucose, Bld: 91 mg/dL (ref 70–99)
Phosphorus: 7.8 mg/dL — ABNORMAL HIGH (ref 2.3–4.6)
Potassium: 4.3 meq/L (ref 3.5–5.1)
Sodium: 138 meq/L (ref 135–145)

## 2012-05-31 LAB — GLUCOSE, CAPILLARY
Glucose-Capillary: 84 mg/dL (ref 70–99)
Glucose-Capillary: 102 mg/dL — ABNORMAL HIGH (ref 70–99)
Glucose-Capillary: 114 mg/dL — ABNORMAL HIGH (ref 70–99)

## 2012-05-31 MED ORDER — SEVELAMER CARBONATE 800 MG PO TABS
800.0000 mg | ORAL_TABLET | Freq: Three times a day (TID) | ORAL | Status: DC
  Administered 2012-05-31 – 2012-06-04 (×12): 800 mg via ORAL
  Filled 2012-05-31 (×15): qty 1

## 2012-05-31 NOTE — Progress Notes (Signed)
Assessment/Plan:  1 AKI/CKD- multifactorial. progressive CKD related to poorly controlled hypertension & chronic CHF. Pt now with hydronephrosis and hydroureter with multiple renal calculi bilaterally S/P cysto RGP and L stent with good uop . Plan Remove HD catheter          Vein Mapping for possible AVF          Cont IVF, decrease rate  Subjective: Interval History: Wants HD cath out  Objective: Vital signs in last 24 hours: Temp:  [98.2 F (36.8 C)-98.5 F (36.9 C)] 98.2 F (36.8 C) (07/05 0844) Pulse Rate:  [73-77] 76  (07/05 0844) Resp:  [17-20] 20  (07/05 0844) BP: (111-133)/(41-63) 111/41 mmHg (07/05 0844) SpO2:  [93 %-100 %] 93 % (07/05 0844) Weight:  [164.157 kg (361 lb 14.4 oz)] 164.157 kg (361 lb 14.4 oz) (07/04 2141) Weight change: 0.957 kg (2 lb 1.8 oz)  Intake/Output from previous day: 07/04 0701 - 07/05 0700 In: 2978.8 [P.O.:840; I.V.:2138.8] Out: 2200 [Urine:2200] Intake/Output this shift: Total I/O In: 120 [P.O.:120] Out: 400 [Urine:400]  Obese female Lungs clear Cor RR Abd obese Ext tr to 1+  Lab Results:  Basename 05/31/12 0525 05/30/12 0600  WBC 13.9* 14.0*  HGB 8.2* 8.4*  HCT 27.0* 27.7*  PLT 331 336   BMET:  Basename 05/31/12 0525 05/30/12 0600  NA 138 141  K 4.3 4.3  CL 101 102  CO2 20 21  GLUCOSE 91 87  BUN 82* 81*  CREATININE 5.80* 5.84*  CALCIUM 7.4* 7.3*   No results found for this basename: PTH:2 in the last 72 hours Iron Studies: No results found for this basename: IRON,TIBC,TRANSFERRIN,FERRITIN in the last 72 hours Studies/Results: Dg Abd 1 View  05/30/2012  *RADIOLOGY REPORT*  Clinical Data: Bleeding from the ureteral stent - - - the patient was performed with graded, and the image is of poor resolution in this large patient  ABDOMEN - 1 VIEW  Comparison: CT abdomen of 05/25/2012  Findings: A left ureteral stent is faintly visualized.  The resolution is very poor in this large patient done portably.  The bowel gas pattern is  nonspecific.  IMPRESSION:  1.  Very poor resolution in this large patient done portably.  No bowel obstruction. 2.  Left ureteral stent is faintly visualized proximally.  Original Report Authenticated By: Joretta Bachelor, M.D.    Scheduled:   . aspirin EC  81 mg Oral Daily  . carvedilol  6.25 mg Oral BID WC  . DULoxetine  60 mg Oral Daily  . enoxaparin (LOVENOX) injection  30 mg Subcutaneous Q24H  . ferrous sulfate  325 mg Oral BID  . folic acid  1 mg Oral Daily  . hydrALAZINE  37.5 mg Oral TID  . insulin aspart  0-5 Units Subcutaneous QHS  . insulin aspart  0-9 Units Subcutaneous TID WC  . isosorbide mononitrate  30 mg Oral Daily  . levothyroxine  25 mcg Oral QAC breakfast  . LORazepam  0.5-1 mg Intravenous Once  . senna  1 tablet Oral BID  . simvastatin  20 mg Oral q1800  . sodium bicarbonate  1,300 mg Oral BID  . sodium chloride  3 mL Intravenous Q12H  . zinc sulfate  220 mg Oral Daily  . DISCONTD: Racepinephrine HCl  0.5 mL Nebulization Once  . DISCONTD: ascorbic acid  500 mg Oral Daily      LOS: 10 days   Jakaden Ouzts C 05/31/2012,9:23 AM

## 2012-05-31 NOTE — Progress Notes (Signed)
Call placed to Dr. Blenda Nicely, informed of patient 5-beat run of v-tach, no c/o sob or chest discomfort. Bp 118/47, pulse 72.  No new orders.

## 2012-05-31 NOTE — Progress Notes (Signed)
VASCULAR LAB PRELIMINARY  PRELIMINARY  PRELIMINARY  PRELIMINARY  Bilateral upper extremity vein mapping completed.      Franke Menter,  RVT 05/31/2012, 12:17 PM

## 2012-05-31 NOTE — Progress Notes (Addendum)
Telemetry monitor continued passed 48 hours due to patient occasional VT.

## 2012-05-31 NOTE — Progress Notes (Signed)
Subjective: Continues to improve.  HPI:  51 year old with history of systolic CHF, HTN, DM 2, Morbid obesity, CKD 3, chronic iron deficiency anemia, hypothyroidism, admitted to [facility] 05/21/12, for exertional dyspnea attributable to acute CHF decompensation. In the ED she was found to have acute on chronic anemia with hemoglobin of 7.1 g/dl, from a baseline of 9.8 a month ago. She also had a bump in creatinine from a baseline of 2.4 to 4.06. She was eventually found to have a left hydronephrosis and in the face of deteriorating renal function, was subsequently transferred to [facility] on 04/28/12 to undergo percutaneous nephrostomy as well as placement of a tunneled HD cath. She was unable to tolerate either procedure due to SOB on horizontal positioning, therefore a L IJ HD cath was placed.    Objective: Vital signs in last 24 hours: Temp:  [98.2 F (36.8 C)-98.5 F (36.9 C)] 98.4 F (36.9 C) (07/05 0539) Pulse Rate:  [73-77] 73  (07/05 0539) Resp:  [17-20] 17  (07/05 0539) BP: (120-133)/(49-63) 133/60 mmHg (07/05 0539) SpO2:  [95 %-100 %] 95 % (07/05 0539) Weight:  [164.157 kg (361 lb 14.4 oz)] 164.157 kg (361 lb 14.4 oz) (07/04 2141) Weight change: 0.957 kg (2 lb 1.8 oz) Last BM Date: 05/29/12  Intake/Output from previous day: 07/04 0701 - 07/05 0700 In: 2978.8 [P.O.:840; I.V.:2138.8] Out: 2200 [Urine:2200]     Physical Exam: General: Alert, communicative, fully oriented, not short of breath at rest, comfortable.  HEENT: Moderate clinical pallor, no jaundice, no conjunctival injection or discharge. Hydration status is fair.  NECK: Supple, JVP not seen, no carotid bruits, no palpable lymphadenopathy, no palpable goiter.  CHEST: Clinically clear to auscultation, no wheezes, no crackles.  HEART: Sounds 1 and 2 heard, normal, regular, no murmurs.  ABDOMEN: Morbidly obese, soft, non-tender. Skin lesions of Hydradenitis affecting lower abdominal wall/pannus as well  as groin and perineum, are dry, with only minimal exudate.Marland Kitchen  GENITALIA: Not examined.  LOWER EXTREMITIES: No pitting edema, palpable peripheral pulses.  MUSCULOSKELETAL SYSTEM: Unremarkable.  CENTRAL NERVOUS SYSTEM: No focal neurologic deficit on gross examination.  Lab Results:  Basename 05/31/12 0525 05/30/12 0600  WBC 13.9* 14.0*  HGB 8.2* 8.4*  HCT 27.0* 27.7*  PLT 331 336    Basename 05/31/12 0525 05/30/12 0600  NA 138 141  K 4.3 4.3  CL 101 102  CO2 20 21  GLUCOSE 91 87  BUN 82* 81*  CREATININE 5.80* 5.84*  CALCIUM 7.4* 7.3*   Recent Results (from the past 240 hour(s))  WOUND CULTURE     Status: Normal   Collection Time   05/21/12  9:13 PM      Component Value Range Status Comment   Specimen Description OTHER   Final    Special Requests NONE   Final    Gram Stain     Final    Value: NO WBC SEEN     FEW SQUAMOUS EPITHELIAL CELLS PRESENT     FEW GRAM POSITIVE COCCI IN PAIRS     FEW GRAM NEGATIVE COCCOBACILLI   Culture     Final    Value: MODERATE GROUP B STREP(S.AGALACTIAE)ISOLATED     Note: TESTING AGAINST S. AGALACTIAE NOT ROUTINELY PERFORMED DUE TO PREDICTABILITY OF AMP/PEN/VAN SUSCEPTIBILITY.   Report Status 05/24/2012 FINAL   Final   URINE CULTURE     Status: Normal   Collection Time   05/22/12  4:52 PM      Component Value Range  Status Comment   Specimen Description URINE, CATHETERIZED   Final    Special Requests NONE   Final    Culture  Setup Time [id number]   Final    Colony Count NO GROWTH   Final    Culture NO GROWTH   Final    Report Status 05/23/2012 FINAL   Final   MRSA PCR SCREENING     Status: Normal   Collection Time   05/23/12  6:07 PM      Component Value Range Status Comment   MRSA by PCR NEGATIVE  NEGATIVE Final      Studies/Results: Dg Abd 1 View  05/30/2012  *RADIOLOGY REPORT*  Clinical Data: Bleeding from the ureteral stent - - - the patient was performed with graded, and the image is of poor resolution in this large patient   ABDOMEN - 1 VIEW  Comparison: CT abdomen of 05/25/2012  Findings: A left ureteral stent is faintly visualized.  The resolution is very poor in this large patient done portably.  The bowel gas pattern is nonspecific.  IMPRESSION:  1.  Very poor resolution in this large patient done portably.  No bowel obstruction. 2.  Left ureteral stent is faintly visualized proximally.  Original Report Authenticated By: Joretta Bachelor, M.D.    Medications: Scheduled Meds:    . aspirin EC  81 mg Oral Daily  . carvedilol  6.25 mg Oral BID WC  . DULoxetine  60 mg Oral Daily  . enoxaparin (LOVENOX) injection  30 mg Subcutaneous Q24H  . ferrous sulfate  325 mg Oral BID  . folic acid  1 mg Oral Daily  . hydrALAZINE  37.5 mg Oral TID  . insulin aspart  0-5 Units Subcutaneous QHS  . insulin aspart  0-9 Units Subcutaneous TID WC  . isosorbide mononitrate  30 mg Oral Daily  . levothyroxine  25 mcg Oral QAC breakfast  . LORazepam  0.5-1 mg Intravenous Once  . senna  1 tablet Oral BID  . simvastatin  20 mg Oral q1800  . sodium bicarbonate  1,300 mg Oral BID  . sodium chloride  3 mL Intravenous Q12H  . zinc sulfate  220 mg Oral Daily  . DISCONTD: Racepinephrine HCl  0.5 mL Nebulization Once  . DISCONTD: ascorbic acid  500 mg Oral Daily   Continuous Infusions:    . sodium chloride 100 mL/hr at 05/30/12 2300   PRN Meds:.acetaminophen, acetaminophen, cyclobenzaprine, feeding supplement, heparin, morphine injection, ondansetron (ZOFRAN) IV, ondansetron, oxyCODONE-acetaminophen, phenol  Assessment/Plan:  Active Problems:  1. Nonischemic cardiomyopathy:  Patient presented with acute on chronic systolic CHF secondary to volume overload from kidney and urologic dysfunction and noncompliance with meds Last ejection fraction 35-40% May 2013. Cardiology consultation was provided by Dr Daneen Schick, and patient was subsequently seen by Dr Fransico Him. Managed with diuretics, but eventually required HD to control volume  status. Patient is now clinically compensated. It appears that no further HD is anticipated.  2. Acute on chronic kidney disease stage III:  This is multifactorial, progressive CKD related to poorly controlled chronic CHF.  Patient now with hydronephrosis and hydroureter with CT evidence of multiple renal calculi bilaterally. Following urinary stenting, creatinine has plateaued, and has finally begun trending down from a high of 6.22 on 05/27/12, to 5.84 on 05/30/12. Labs are pending today. Urine output is good. Management per nephrology. Dr Marval Regal provided nephrology consultation.  3. Left hydronephrosis:  As described above, imaging studies demonstrated obstructive uropathy/left hydronephrosis, secondary to calculi.  Management per urology. S/P cystoscopic retrograde ureterogram and left ureteral stent on 05/28/12 with good urine output.  Consultation was provided by Dr Nicki Reaper McDarmid. Managing per Urology.  4. Acute on chronic anemia:  Patient had a profound anemia on presentation, with HB 7.1 (Baseline 9.8 in May 2013). She is status post transfusion of 2 units packed red blood cells 05/21/12, and hemoglobin is now stable/reasonable at 8.5-8.2.  5. History of hidradenitis suppurativa:  Patient has a long history of hydradinitis suppurativa,  Against a background of morbid obesity and followed by Dr. Tommy Medal on antibiotics. She had been referred to plastic surgery at [facility] and was told that definitive therapy can be accomplished in due course,  but will require improved overall clinical status. Dr Scharlene Gloss has seen patient on this occasion, and recommended discontinuation of antibiotics. Surgical drainage will be pursed at [facility], on discharge.  6. Diabetes mellitus type 2:  This is insulin-requiring. Patient has been managed with diet, SSI and Lantus during this hospitalization, with adequate control, but Lantus was discontinued on 05/28/12, due to Hypoglycemia, while n.p.o. Lantus currently held. CBGs have  remained controlled, since.    LOS: 10 days   Trooper Olander,CHRISTOPHER 05/31/2012, 7:46 AM

## 2012-06-01 LAB — GLUCOSE, CAPILLARY
Glucose-Capillary: 95 mg/dL (ref 70–99)
Glucose-Capillary: 139 mg/dL — ABNORMAL HIGH (ref 70–99)

## 2012-06-01 LAB — CBC
HCT: 26.4 % — ABNORMAL LOW (ref 36.0–46.0)
Hemoglobin: 8 g/dL — ABNORMAL LOW (ref 12.0–15.0)
MCH: 25 pg — ABNORMAL LOW (ref 26.0–34.0)
MCHC: 30.3 g/dL (ref 30.0–36.0)
RDW: 20.8 % — ABNORMAL HIGH (ref 11.5–15.5)

## 2012-06-01 LAB — RENAL FUNCTION PANEL
Calcium: 7.5 mg/dL — ABNORMAL LOW (ref 8.4–10.5)
Creatinine, Ser: 5.29 mg/dL — ABNORMAL HIGH (ref 0.50–1.10)
GFR calc Af Amer: 10 mL/min — ABNORMAL LOW (ref >90)
GFR calc non Af Amer: 9 mL/min — ABNORMAL LOW (ref >90)
Phosphorus: 7.7 mg/dL — ABNORMAL HIGH (ref 2.3–4.6)
Sodium: 138 mEq/L (ref 135–145)

## 2012-06-01 LAB — MAGNESIUM: Magnesium: 1.7 mg/dL (ref 1.5–2.5)

## 2012-06-01 NOTE — Progress Notes (Signed)
Subjective: No complaints, up in chair, eating well, no n/v/d.   Objective Vital signs in last 24 hours: Filed Vitals:   05/31/12 2110 06/01/12 0539 06/01/12 0813 06/01/12 1214  BP: 122/68 128/71 127/68 117/60  Pulse: 74 74 74 69  Temp: 98.6 F (37 C) 97.9 F (36.6 C) 98.1 F (36.7 C) 97.6 F (36.4 C)  TempSrc: Oral Oral Oral Oral  Resp: 19 18 21 18   Height:      Weight: 161.481 kg (356 lb)     SpO2: 97% 92% 93% 94%   Weight change: -2.676 kg (-5 lb 14.4 oz)  Intake/Output Summary (Last 24 hours) at 06/01/12 1433 Last data filed at 06/01/12 0701  Gross per 24 hour  Intake 2199.58 ml  Output   1700 ml  Net 499.58 ml   Labs: Basic Metabolic Panel:  Lab [id number] 05/31/12 0525 05/30/12 0600 05/29/12 0600 05/28/12 0615 05/27/12 0707 05/26/12 0415  NA 138 138 141 139 140 140 138  K 4.2 4.3 4.3 4.3 4.2 4.3 4.0  CL 102 101 102 102 103 105 104  CO2 20 20 21 21 20 21  18*  GLUCOSE 96 91 87 79 65* 67* 108*  BUN 80* 82* 81* 81* 80* 77* 71*  CREATININE 5.29* 5.80* 5.84* 6.19* 6.04* 6.22* 5.58*  ALB -- -- -- -- -- -- --  CALCIUM 7.5* 7.4* 7.3* 7.7* 7.7* 8.0* 7.4*  PHOS 7.7* 7.8* 8.1* 8.5* 8.3* 8.1* --   Liver Function Tests:  Lab 06/01/12 0635 05/31/12 0525 05/30/12 0600  AST -- -- 11  ALT -- -- 5  ALKPHOS -- -- 148*  BILITOT -- -- 0.3  PROT -- -- 7.8  ALBUMIN 2.0* 2.1* 2.0*   No results found for this basename: LIPASE:3,AMYLASE:3 in the last 168 hours No results found for this basename: AMMONIA:3 in the last 168 hours CBC:  Lab 06/01/12 0635 05/31/12 0525 05/30/12 0600 05/27/12 0707  WBC 16.8* 13.9* 14.0* 16.1*  NEUTROABS -- -- -- --  HGB 8.0* 8.2* 8.4* 8.5*  HCT 26.4* 27.0* 27.7* 28.2*  MCV 82.5 83.1 82.2 82.2  PLT 322 331 336 384   PT/INR: @labrcntip (inr:5) Cardiac Enzymes: No results found for this basename: CKTOTAL:5,CKMB:5,CKMBINDEX:5,TROPONINI:5 in the last 168 hours CBG:  Lab 06/01/12 1138 06/01/12 0726 05/31/12 2054 05/31/12 1705 05/31/12 1329    GLUCAP 139* 95 139* 114* 102*    Iron Studies: No results found for this basename: IRON:30,TIBC:30,TRANSFERRIN:30,FERRITIN:30 in the last 168 hours  Physical Exam:  Blood pressure 117/60, pulse 69, temperature 97.6 F (36.4 C), temperature source Oral, resp. rate 18, height 5\' 6"  (1.676 m), weight 161.481 kg (356 lb), SpO2 94.00%.  Alert and cooperative  Lungs clear  Cor RRR  Abd obese  Ext tr edema  Impression 1. AKI due to obstruction- s/p stent L kidney on 7/2. Creat peaked at 6.2, down to 5.2 today.  2. CKD IV- baseline Cr 2.4 in May 2013, eGFR 25 ml/min 3. NICM- EF 35-45%. No signs of vol overload. Weight down 3 kg since admit on 6/25. 4. HTN- on hydralazine 37.5tid andcoreg 6.25bid, no diuretics. 5. 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 Anemia of CKD- Hb 8's on po iron  Rec- check creat am. Cont IVF at 75/hr.  May be OK for discharge soon if creat continues to improve.  She sees Dr. Erling Cruz in the office.     Shelley Splinter  MD [facility] [phone] pgr    [phone] cell 06/01/2012, 2:33 PM

## 2012-06-01 NOTE — Progress Notes (Signed)
Subjective: No new issues.  HPI:  51 year old with history of systolic CHF, HTN, DM 2, Morbid obesity, CKD 3, chronic iron deficiency anemia, hypothyroidism, admitted to [facility] 05/21/12, for exertional dyspnea attributable to acute CHF decompensation. In the ED she was found to have acute on chronic anemia with hemoglobin of 7.1 g/dl, from a baseline of 9.8 a month ago. She also had a bump in creatinine from a baseline of 2.4 to 4.06. She was eventually found to have a left hydronephrosis and in the face of deteriorating renal function, was subsequently transferred to [facility] on 04/28/12 to undergo percutaneous nephrostomy as well as placement of a tunneled HD cath. She was unable to tolerate either procedure due to SOB on horizontal positioning, therefore a L IJ HD cath was placed.    Objective: Vital signs in last 24 hours: Temp:  [97.9 F (36.6 C)-98.6 F (37 C)] 98.1 F (36.7 C) (07/06 0813) Pulse Rate:  [72-101] 74  (07/06 0813) Resp:  [18-22] 21  (07/06 0813) BP: (118-130)/(47-72) 127/68 mmHg (07/06 0813) SpO2:  [92 %-98 %] 93 % (07/06 0813) Weight:  [161.481 kg (356 lb)] 161.481 kg (356 lb) (07/05 2110) Weight change: -2.676 kg (-5 lb 14.4 oz) Last BM Date: 05/29/12  Intake/Output from previous day: 07/05 0701 - 07/06 0700 In: 2439.6 [P.O.:480; I.V.:1959.6] Out: 600 [Urine:600] Total I/O In: -  Out: 1500 [Urine:1500]   Physical Exam: General: Alert, communicative, fully oriented, not short of breath at rest, comfortable.  HEENT: Moderate clinical pallor, no jaundice, no conjunctival injection or discharge. Hydration status is fair.  NECK: Supple, JVP not seen, no carotid bruits, no palpable lymphadenopathy, no palpable goiter.  CHEST: Clinically clear to auscultation, no wheezes, no crackles.  HEART: Sounds 1 and 2 heard, normal, regular, no murmurs.  ABDOMEN: Morbidly obese, soft, non-tender. Skin lesions of Hydradenitis affecting lower abdominal  wall/pannus as well as groin and perineum, are dry, with only minimal exudate.Marland Kitchen  GENITALIA: Not examined.  LOWER EXTREMITIES: No pitting edema, palpable peripheral pulses.  MUSCULOSKELETAL SYSTEM: Unremarkable.  CENTRAL NERVOUS SYSTEM: No focal neurologic deficit on gross examination.  Lab Results:  [facility] 06/01/12 0635 05/31/12 0525  WBC 16.8* 13.9*  HGB 8.0* 8.2*  HCT 26.4* 27.0*  PLT 322 331    Basename 06/01/12 0635 05/31/12 0525  NA 138 138  K 4.2 4.3  CL 102 101  CO2 20 20  GLUCOSE 96 91  BUN 80* 82*  CREATININE 5.29* 5.80*  CALCIUM 7.5* 7.4*   Recent Results (from the past 240 hour(s))  URINE CULTURE     Status: Normal   Collection Time   05/22/12  4:52 PM      Component Value Range Status Comment   Specimen Description URINE, CATHETERIZED   Final    Special Requests NONE   Final    Culture  Setup Time [id number]   Final    Colony Count NO GROWTH   Final    Culture NO GROWTH   Final    Report Status 05/23/2012 FINAL   Final   MRSA PCR SCREENING     Status: Normal   Collection Time   05/23/12  6:07 PM      Component Value Range Status Comment   MRSA by PCR NEGATIVE  NEGATIVE Final      Studies/Results: No results found.  Medications: Scheduled Meds:    . aspirin EC  81 mg Oral Daily  . carvedilol  6.25 mg Oral BID  WC  . DULoxetine  60 mg Oral Daily  . enoxaparin (LOVENOX) injection  30 mg Subcutaneous Q24H  . ferrous sulfate  325 mg Oral BID  . folic acid  1 mg Oral Daily  . hydrALAZINE  37.5 mg Oral TID  . insulin aspart  0-5 Units Subcutaneous QHS  . insulin aspart  0-9 Units Subcutaneous TID WC  . isosorbide mononitrate  30 mg Oral Daily  . levothyroxine  25 mcg Oral QAC breakfast  . LORazepam  0.5-1 mg Intravenous Once  . senna  1 tablet Oral BID  . sevelamer  800 mg Oral TID WC  . simvastatin  20 mg Oral q1800  . sodium bicarbonate  1,300 mg Oral BID  . sodium chloride  3 mL Intravenous Q12H  . zinc sulfate  220 mg Oral Daily    Continuous Infusions:    . sodium chloride 75 mL/hr at 06/01/12 0644   PRN Meds:.acetaminophen, acetaminophen, cyclobenzaprine, feeding supplement, heparin, morphine injection, ondansetron (ZOFRAN) IV, ondansetron, oxyCODONE-acetaminophen, phenol  Assessment/Plan:  Active Problems:  1. Nonischemic cardiomyopathy:  Patient presented with acute on chronic systolic CHF secondary to volume overload from kidney and urologic dysfunction and noncompliance with meds Last ejection fraction 35-40% May 2013. Cardiology consultation was provided by Dr Daneen Schick, and patient was subsequently seen by Dr Fransico Him. Managed with diuretics, but eventually required HD to control volume status. Patient is now clinically compensated. It appears that no further HD is anticipated. HD catheter discontinued on 05/31/12. Vein mapping planned.   2. Acute on chronic kidney disease stage III:  This is multifactorial, progressive CKD related to poorly controlled chronic CHF.  Patient now with hydronephrosis and hydroureter with CT evidence of multiple renal calculi bilaterally. Following urinary stenting, creatinine has plateaued, and has finally begun trending down from a high of 6.22 on 05/27/12, to 5.29 on 06/01/12. Urine output is good. Management per nephrology. Dr Marval Regal provided nephrology consultation.  3. Left hydronephrosis:  As described above, imaging studies demonstrated obstructive uropathy/left hydronephrosis, secondary to calculi. Management per urology. S/P cystoscopic retrograde ureterogram and left ureteral stent on 05/28/12 with good urine output.  Consultation was provided by Dr Nicki Reaper McDarmid. Managing per Urology.  4. Acute on chronic anemia:  Patient had a profound anemia on presentation, with HB 7.1 (Baseline 9.8 in May 2013). She is status post transfusion of 2 units packed red blood cells 05/21/12, and hemoglobin is now stable/reasonable at 8.5-8.2.  5. History of hidradenitis suppurativa:  Patient  has a long history of hydradinitis suppurativa,  Against a background of morbid obesity and followed by Dr. Tommy Medal on antibiotics. She had been referred to plastic surgery at [facility] and was told that definitive therapy can be accomplished in due course,  but will require improved overall clinical status. Dr Scharlene Gloss has seen patient on this occasion, and recommended discontinuation of antibiotics. Surgical drainage will be pursed at [facility], on discharge.  6. Diabetes mellitus type 2:  This is insulin-requiring. Patient has been managed with diet, SSI and Lantus during this hospitalization, with adequate control, but Lantus was discontinued on 05/28/12, due to Hypoglycemia, while n.p.o. Lantus currently held. CBGs have remained controlled, since.    LOS: 11 days   Kinston Magnan,CHRISTOPHER 06/01/2012, 11:54 AM

## 2012-06-02 LAB — RENAL FUNCTION PANEL
Albumin: 2 g/dL — ABNORMAL LOW (ref 3.5–5.2)
BUN: 81 mg/dL — ABNORMAL HIGH (ref 6–23)
Chloride: 102 mEq/L (ref 96–112)
Creatinine, Ser: 5.26 mg/dL — ABNORMAL HIGH (ref 0.50–1.10)
GFR calc non Af Amer: 9 mL/min — ABNORMAL LOW (ref >90)
Phosphorus: 7.2 mg/dL — ABNORMAL HIGH (ref 2.3–4.6)

## 2012-06-02 LAB — GLUCOSE, CAPILLARY
Glucose-Capillary: 86 mg/dL (ref 70–99)
Glucose-Capillary: 111 mg/dL — ABNORMAL HIGH (ref 70–99)
Glucose-Capillary: 128 mg/dL — ABNORMAL HIGH (ref 70–99)
Glucose-Capillary: 157 mg/dL — ABNORMAL HIGH (ref 70–99)

## 2012-06-02 NOTE — Progress Notes (Signed)
Subjective: No complaints, had 3300cc UOP yesterday, not on diuretics. Creat 5.2 (no change from yest).   Objective Vital signs in last 24 hours: Filed Vitals:   06/01/12 1833 06/01/12 2107 06/02/12 0600 06/02/12 0856  BP: 122/68 110/65 129/67 143/75  Pulse: 78 77 75 76  Temp: 97.7 F (36.5 C) 97.6 F (36.4 C) 97.9 F (36.6 C) 98 F (36.7 C)  TempSrc: Oral Oral Oral Oral  Resp: 20 20 18 18   Height:      Weight:  166.289 kg (366 lb 9.6 oz)    SpO2: 91% 93% 96% 94%   Weight change: 4.808 kg (10 lb 9.6 oz)  Intake/Output Summary (Last 24 hours) at 06/02/12 1157 Last data filed at 06/02/12 0857  Gross per 24 hour  Intake   1260 ml  Output   1800 ml  Net   -540 ml   Labs: Basic Metabolic Panel:  Lab [id number] 06/01/12 0635 05/31/12 0525 05/30/12 0600 05/29/12 0600 05/28/12 0615 05/27/12 0707  NA 137 138 138 141 139 140 140  K 4.1 4.2 4.3 4.3 4.3 4.2 4.3  CL 102 102 101 102 102 103 105  CO2 21 20 20 21 21 20 21   GLUCOSE 96 96 91 87 79 65* 67*  BUN 81* 80* 82* 81* 81* 80* 77*  CREATININE 5.26* 5.29* 5.80* 5.84* 6.19* 6.04* 6.22*  ALB -- -- -- -- -- -- --  CALCIUM 7.7* 7.5* 7.4* 7.3* 7.7* 7.7* 8.0*  PHOS 7.2* 7.7* 7.8* 8.1* 8.5* 8.3* 8.1*   Liver Function Tests:  Lab 06/02/12 [id number] 06/01/12 0635 05/31/12 0525 05/30/12 0600  AST -- -- -- 11  ALT -- -- -- 5  ALKPHOS -- -- -- 148*  BILITOT -- -- -- 0.3  PROT -- -- -- 7.8  ALBUMIN 2.0* 2.0* 2.1* --   No results found for this basename: LIPASE:3,AMYLASE:3 in the last 168 hours No results found for this basename: AMMONIA:3 in the last 168 hours CBC:  Lab 06/01/12 0635 05/31/12 0525 05/30/12 0600 05/27/12 0707  WBC 16.8* 13.9* 14.0* 16.1*  NEUTROABS -- -- -- --  HGB 8.0* 8.2* 8.4* 8.5*  HCT 26.4* 27.0* 27.7* 28.2*  MCV 82.5 83.1 82.2 82.2  PLT 322 331 336 384   PT/INR: @labrcntip (inr:5) Cardiac Enzymes: No results found for this basename: CKTOTAL:5,CKMB:5,CKMBINDEX:5,TROPONINI:5 in the last 168  hours CBG:  Lab 06/02/12 1116 06/02/12 0726 06/01/12 2106 06/01/12 1709 06/01/12 1138  GLUCAP 111* 86 141* 127* 139*    Iron Studies: No results found for this basename: IRON:30,TIBC:30,TRANSFERRIN:30,FERRITIN:30 in the last 168 hours  Physical Exam:  Blood pressure 143/75, pulse 76, temperature 98 F (36.7 C), temperature source Oral, resp. rate 18, height 5\' 6"  (1.676 m), weight 166.289 kg (366 lb 9.6 oz), SpO2 94.00%.  Alert and cooperative  Lungs clear  Cor RRR  Abd obese  Ext tr edema  Impression/Rec 1. AKI due to obstruction- s/p stent L kidney on 7/2. Creat peaked at 6.2, down to 5.2 today yesterday and 5.2 again today. Good UOP, weight stable, no gross vol excess, getting IVF's which I will decrease to 50cc/hr. Check creat in am.  2. CKD IV- baseline Cr 2.4 in May 2013 3. NICM- EF 35-45%. No signs of vol overload. Weight down 3 kg since admit on 6/25. 4. HTN- on hydralazine 37.5tid andcoreg 6.25bid, no diuretics. 5. 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 Anemia of CKD- Hb 8's on po iron   Kelly Splinter  MD [employer]  [phone] pgr    [phone] cell 06/02/2012, 11:57 AM

## 2012-06-02 NOTE — Progress Notes (Signed)
Afebrile.  V/S stable No pain Urinary output: 3300 ml yesterday, 750 ml so far today. Creatinine: 5.26  BUN: 81. Will be followed by Dr Matilde Sprang as outpatient after discharge.

## 2012-06-02 NOTE — Progress Notes (Signed)
Subjective: No new issues.  HPI:  51 year old with history of systolic CHF, HTN, DM 2, Morbid obesity, CKD 3, chronic iron deficiency anemia, hypothyroidism, admitted to [facility] 05/21/12, for exertional dyspnea attributable to acute CHF decompensation. In the ED she was found to have acute on chronic anemia with hemoglobin of 7.1 g/dl, from a baseline of 9.8 a month ago. She also had a bump in creatinine from a baseline of 2.4 to 4.06. She was eventually found to have a left hydronephrosis and in the face of deteriorating renal function, was subsequently transferred to [facility] on 04/28/12 to undergo percutaneous nephrostomy as well as placement of a tunneled HD cath. She was unable to tolerate either procedure due to SOB on horizontal positioning, therefore a L IJ HD cath was placed.    Objective: Vital signs in last 24 hours: Temp:  [97.6 F (36.4 C)-98 F (36.7 C)] 98 F (36.7 C) (07/07 0856) Pulse Rate:  [75-78] 76  (07/07 0856) Resp:  [18-20] 18  (07/07 0856) BP: (110-143)/(65-75) 143/75 mmHg (07/07 0856) SpO2:  [91 %-96 %] 94 % (07/07 0856) Weight:  [166.289 kg (366 lb 9.6 oz)] 166.289 kg (366 lb 9.6 oz) (07/06 2107) Weight change: 4.808 kg (10 lb 9.6 oz) Last BM Date: 05/29/12  Intake/Output from previous day: 07/06 0701 - 07/07 0700 In: 1140 [P.O.:240; I.V.:900] Out: 3300 [Urine:3300] Total I/O In: 120 [P.O.:120] Out: -    Physical Exam: General: Alert, communicative, fully oriented, not short of breath at rest, comfortable.  HEENT: Moderate clinical pallor, no jaundice, no conjunctival injection or discharge. Hydration status is fair.  NECK: Supple, JVP not seen, no carotid bruits, no palpable lymphadenopathy, no palpable goiter.  CHEST: Clinically clear to auscultation, no wheezes, no crackles.  HEART: Sounds 1 and 2 heard, normal, regular, no murmurs.  ABDOMEN: Morbidly obese, soft, non-tender. Skin lesions of Hydradenitis affecting lower abdominal  wall/pannus as well as groin and perineum, are dry, with only minimal exudate.Marland Kitchen  GENITALIA: Not examined.  LOWER EXTREMITIES: No pitting edema, palpable peripheral pulses.  MUSCULOSKELETAL SYSTEM: Unremarkable.  CENTRAL NERVOUS SYSTEM: No focal neurologic deficit on gross examination.  Lab Results:  [facility] 06/01/12 0635 05/31/12 0525  WBC 16.8* 13.9*  HGB 8.0* 8.2*  HCT 26.4* 27.0*  PLT 322 331    Basename 06/02/12 0635 06/01/12 0635  NA 137 138  K 4.1 4.2  CL 102 102  CO2 21 20  GLUCOSE 96 96  BUN 81* 80*  CREATININE 5.26* 5.29*  CALCIUM 7.7* 7.5*   Recent Results (from the past 240 hour(s))  MRSA PCR SCREENING     Status: Normal   Collection Time   05/23/12  6:07 PM      Component Value Range Status Comment   MRSA by PCR NEGATIVE  NEGATIVE Final      Studies/Results: No results found.  Medications: Scheduled Meds:    . aspirin EC  81 mg Oral Daily  . carvedilol  6.25 mg Oral BID WC  . DULoxetine  60 mg Oral Daily  . enoxaparin (LOVENOX) injection  30 mg Subcutaneous Q24H  . ferrous sulfate  325 mg Oral BID  . folic acid  1 mg Oral Daily  . hydrALAZINE  37.5 mg Oral TID  . insulin aspart  0-5 Units Subcutaneous QHS  . insulin aspart  0-9 Units Subcutaneous TID WC  . isosorbide mononitrate  30 mg Oral Daily  . levothyroxine  25 mcg Oral QAC breakfast  .  LORazepam  0.5-1 mg Intravenous Once  . senna  1 tablet Oral BID  . sevelamer  800 mg Oral TID WC  . simvastatin  20 mg Oral q1800  . sodium bicarbonate  1,300 mg Oral BID  . sodium chloride  3 mL Intravenous Q12H  . zinc sulfate  220 mg Oral Daily   Continuous Infusions:    . sodium chloride 75 mL/hr at 06/01/12 2028   PRN Meds:.acetaminophen, acetaminophen, cyclobenzaprine, feeding supplement, heparin, morphine injection, ondansetron (ZOFRAN) IV, ondansetron, oxyCODONE-acetaminophen, phenol  Assessment/Plan:  Active Problems:  1. Nonischemic cardiomyopathy:  Patient presented with acute on  chronic systolic CHF secondary to volume overload from kidney and urologic dysfunction and noncompliance with meds Last ejection fraction 35-40% May 2013. Cardiology consultation was provided by Dr Daneen Schick, and patient was subsequently seen by Dr Fransico Him. Managed with diuretics, but eventually required HD to control volume status. Patient is now clinically compensated. It appears that no further HD is anticipated. HD catheter discontinued on 05/31/12. Vein mapping planned.   2. Acute on chronic kidney disease stage III:  This is multifactorial, progressive CKD, related to poorly controlled chronic CHF.  Patient now with left hydronephrosis and hydroureter with CT evidence of multiple renal calculi bilaterally. Following urinary stenting, creatinine has plateaued, and has finally begun trending down from a high of 6.22 on 05/27/12, to 5.26 on 06/02/12. Urine output is good. Management per nephrology. Dr Marval Regal provided nephrology consultation.  3. Left hydronephrosis:  As described above, imaging studies demonstrated obstructive uropathy/left hydronephrosis, secondary to calculi. Management per urology. S/P cystoscopic retrograde ureterogram and left ureteral stent on 05/28/12 with good urine output.  Consultation was provided by Dr Nicki Reaper McDarmid. Managing per Urology.  4. Acute on chronic anemia:  Patient had a profound anemia on presentation, with HB 7.1 (Baseline 9.8 in May 2013). She is status post transfusion of 2 units packed red blood cells 05/21/12, and hemoglobin is now stable/reasonable at 8.5-8.2.  5. History of hidradenitis suppurativa:  Patient has a long history of hydradinitis suppurativa,  Against a background of morbid obesity and followed by Dr. Tommy Medal on antibiotics. She had been referred to plastic surgery at [facility] and was told that definitive therapy can be accomplished in due course,  but will require improved overall clinical status. Dr Scharlene Gloss has seen patient on this occasion,  and recommended discontinuation of antibiotics. Surgical drainage will be pursed at [facility], on discharge.  6. Diabetes mellitus type 2:  This is insulin-requiring. Patient has been managed with diet, SSI and Lantus during this hospitalization, with adequate control, but Lantus was discontinued on 05/28/12, due to Hypoglycemia, while n.p.o. Lantus currently held. CBGs have remained controlled, since.   Comment: Disposition, per Renal.   LOS: 12 days   Caryl Manas,CHRISTOPHER 06/02/2012, 12:35 PM

## 2012-06-03 LAB — GLUCOSE, CAPILLARY: Glucose-Capillary: 151 mg/dL — ABNORMAL HIGH (ref 70–99)

## 2012-06-03 LAB — CBC
MCH: 24.7 pg — ABNORMAL LOW (ref 26.0–34.0)
MCHC: 29.6 g/dL — ABNORMAL LOW (ref 30.0–36.0)
MCV: 83.3 fL (ref 78.0–100.0)
Platelets: 324 10*3/uL (ref 150–400)
RBC: 3.24 MIL/uL — ABNORMAL LOW (ref 3.87–5.11)

## 2012-06-03 LAB — RENAL FUNCTION PANEL
Albumin: 1.9 g/dL — ABNORMAL LOW (ref 3.5–5.2)
BUN: 81 mg/dL — ABNORMAL HIGH (ref 6–23)
Calcium: 7.7 mg/dL — ABNORMAL LOW (ref 8.4–10.5)
Creatinine, Ser: 4.98 mg/dL — ABNORMAL HIGH (ref 0.50–1.10)
GFR calc non Af Amer: 9 mL/min — ABNORMAL LOW (ref >90)
Phosphorus: 6.7 mg/dL — ABNORMAL HIGH (ref 2.3–4.6)

## 2012-06-03 MED ORDER — SODIUM CHLORIDE 0.9 % IV SOLN
1020.0000 mg | Freq: Once | INTRAVENOUS | Status: AC
  Administered 2012-06-03: 1020 mg via INTRAVENOUS
  Filled 2012-06-03: qty 34

## 2012-06-03 NOTE — Progress Notes (Signed)
Subjective: No new issues.  HPI:  51 year old with history of systolic CHF, HTN, DM 2, Morbid obesity, CKD 3, chronic iron deficiency anemia, hypothyroidism, admitted to [facility] 05/21/12, for exertional dyspnea attributable to acute CHF decompensation. In the ED she was found to have acute on chronic anemia with hemoglobin of 7.1 g/dl, from a baseline of 9.8 a month ago. She also had a bump in creatinine from a baseline of 2.4 to 4.06. She was eventually found to have a left hydronephrosis and in the face of deteriorating renal function, was subsequently transferred to [facility] on 04/28/12 to undergo percutaneous nephrostomy as well as placement of a tunneled HD cath. She was unable to tolerate either procedure due to SOB on horizontal positioning, therefore a L IJ HD cath was placed.    Objective: Vital signs in last 24 hours: Temp:  [97.8 F (36.6 C)-98.3 F (36.8 C)] 98 F (36.7 C) (07/08 0900) Pulse Rate:  [73-79] 76  (07/08 0900) Resp:  [18-20] 19  (07/08 0900) BP: (114-142)/(51-77) 142/77 mmHg (07/08 0900) SpO2:  [95 %-98 %] 95 % (07/08 0900) Weight:  [165.9 kg (365 lb 11.9 oz)] 165.9 kg (365 lb 11.9 oz) (07/07 2134) Weight change: -0.389 kg (-13.7 oz) Last BM Date: 06/02/12  Intake/Output from previous day: 07/07 0701 - 07/08 0700 In: 1666 [P.O.:600; I.V.:1066] Out: 1300 [Urine:1300] Total I/O In: 767.5 [P.O.:120; I.V.:647.5] Out: 2300 [Urine:2300]   Physical Exam: General: Alert, communicative, fully oriented, not short of breath at rest, comfortable.  HEENT: Moderate clinical pallor, no jaundice, no conjunctival injection or discharge. Hydration status is fair.  NECK: Supple, JVP not seen, no carotid bruits, no palpable lymphadenopathy, no palpable goiter.  CHEST: Clinically clear to auscultation, no wheezes, no crackles.  HEART: Sounds 1 and 2 heard, normal, regular, no murmurs.  ABDOMEN: Morbidly obese, soft, non-tender. Skin lesions of Hydradenitis  affecting lower abdominal wall/pannus as well as groin and perineum, are dry, with only minimal exudate.Marland Kitchen  GENITALIA: Not examined.  LOWER EXTREMITIES: No pitting edema, palpable peripheral pulses.  MUSCULOSKELETAL SYSTEM: Unremarkable.  CENTRAL NERVOUS SYSTEM: No focal neurologic deficit on gross examination.  Lab Results:  Basename 06/03/12 0618 06/01/12 0635  WBC 17.2* 16.8*  HGB 8.0* 8.0*  HCT 27.0* 26.4*  PLT 324 322    Basename 06/03/12 0618 06/02/12 0635  NA 137 137  K 4.0 4.1  CL 102 102  CO2 20 21  GLUCOSE 113* 96  BUN 81* 81*  CREATININE 4.98* 5.26*  CALCIUM 7.7* 7.7*   No results found for this or any previous visit (from the past 240 hour(s)).   Studies/Results: No results found.  Medications: Scheduled Meds:    . aspirin EC  81 mg Oral Daily  . carvedilol  6.25 mg Oral BID WC  . DULoxetine  60 mg Oral Daily  . enoxaparin (LOVENOX) injection  30 mg Subcutaneous Q24H  . ferrous sulfate  325 mg Oral BID  . ferumoxytol  1,020 mg Intravenous Once  . folic acid  1 mg Oral Daily  . hydrALAZINE  37.5 mg Oral TID  . insulin aspart  0-5 Units Subcutaneous QHS  . insulin aspart  0-9 Units Subcutaneous TID WC  . isosorbide mononitrate  30 mg Oral Daily  . levothyroxine  25 mcg Oral QAC breakfast  . LORazepam  0.5-1 mg Intravenous Once  . senna  1 tablet Oral BID  . sevelamer  800 mg Oral TID WC  . simvastatin  20  mg Oral q1800  . sodium bicarbonate  1,300 mg Oral BID  . sodium chloride  3 mL Intravenous Q12H  . zinc sulfate  220 mg Oral Daily   Continuous Infusions:    . DISCONTD: sodium chloride 75 mL/hr at 06/03/12 0033   PRN Meds:.acetaminophen, acetaminophen, cyclobenzaprine, feeding supplement, heparin, morphine injection, ondansetron (ZOFRAN) IV, ondansetron, oxyCODONE-acetaminophen, phenol  Assessment/Plan:  Active Problems:  1. Nonischemic cardiomyopathy:  Patient presented with acute on chronic systolic CHF secondary to volume overload from  kidney and urologic dysfunction and noncompliance with meds Last ejection fraction 35-40% May 2013. Cardiology consultation was provided by Dr Daneen Schick, and patient was subsequently seen by Dr Fransico Him. Managed with diuretics, but eventually required HD to control volume status. Patient is now clinically compensated. It appears that no further HD is anticipated. HD catheter discontinued on 05/31/12. Vein mapping planned.   2. Acute on chronic kidney disease stage III:  This is multifactorial, progressive CKD, related to poorly controlled chronic CHF.  Patient now with left hydronephrosis and hydroureter with CT evidence of multiple renal calculi bilaterally. Following urinary stenting, creatinine has plateaued, and has finally begun trending down from a high of 6.22 on 05/27/12, to 4.98 on 06/03/12. Urine output is good. Management per nephrology. Dr Marval Regal provided nephrology consultation.  3. Left hydronephrosis:  As described above, imaging studies demonstrated obstructive uropathy/left hydronephrosis, secondary to calculi. Management per urology. S/P cystoscopic retrograde ureterogram and left ureteral stent on 05/28/12 with good urine output.  Consultation was provided by Dr Nicki Reaper McDarmid. Managing per Urology. Patient will follow up with him, on discharge. 4. Acute on chronic anemia:  Patient had a profound anemia on presentation, with HB 7.1 (Baseline 9.8 in May 2013). She is status post transfusion of 2 units packed red blood cells 05/21/12, and hemoglobin is now stable/reasonable at 8.5-8.2. Patient was given parenteral iron transfusion on 06/03/12.  5. History of hidradenitis suppurativa:  Patient has a long history of hydradinitis suppurativa,  Against a background of morbid obesity and followed by Dr. Tommy Medal on antibiotics. She had been referred to plastic surgery at [facility] and was told that definitive therapy can be accomplished in due course,  but will require improved overall clinical status. Dr  Scharlene Gloss has seen patient on this occasion, and recommended discontinuation of antibiotics. Surgical drainage will be pursued at [facility], on discharge.  6. Diabetes mellitus type 2:  This is insulin-requiring. Patient has been managed with diet, SSI and Lantus during this hospitalization, with adequate control, but Lantus was discontinued on 05/28/12, due to Hypoglycemia, while n.p.o. Lantus currently held. CBGs have remained controlled, since.   Comment: Aim discharge on 06/04/12.   LOS: 13 days   Jo Cerone,CHRISTOPHER 06/03/2012, 1:10 PM

## 2012-06-03 NOTE — Progress Notes (Signed)
Subjective: Interval History: none.  Objective: Vital signs in last 24 hours: Temp:  [97.8 F (36.6 C)-98.3 F (36.8 C)] 98 F (36.7 C) (07/08 0900) Pulse Rate:  [72-79] 76  (07/08 0900) Resp:  [18-20] 19  (07/08 0900) BP: (114-142)/(51-77) 142/77 mmHg (07/08 0900) SpO2:  [94 %-98 %] 95 % (07/08 0900) Weight:  [165.9 kg (365 lb 11.9 oz)] 165.9 kg (365 lb 11.9 oz) (07/07 2134) Weight change: -0.389 kg (-13.7 oz)  Intake/Output from previous day: 07/07 0701 - 07/08 0700 In: 1666 [P.O.:600; I.V.:1066] Out: 1300 [Urine:1300] Intake/Output this shift: Total I/O In: 767.5 [P.O.:120; I.V.:647.5] Out: 1700 [Urine:1700]  General appearance: alert, cooperative and morbidly obese Resp: diminished breath sounds bilaterally Cardio: S1, S2 normal GI: obese pos bs, liver down 5 cm  Lab Results:  Basename 06/03/12 0618 06/01/12 0635  WBC 17.2* 16.8*  HGB 8.0* 8.0*  HCT 27.0* 26.4*  PLT 324 322   BMET:  Basename 06/03/12 0618 06/02/12 0635  NA 137 137  K 4.0 4.1  CL 102 102  CO2 20 21  GLUCOSE 113* 96  BUN 81* 81*  CREATININE 4.98* 5.26*  CALCIUM 7.7* 7.7*   No results found for this basename: PTH:2 in the last 72 hours Iron Studies: No results found for this basename: IRON,TIBC,TRANSFERRIN,FERRITIN in the last 72 hours  Studies/Results: No results found.  I have reviewed the patient's current medications.  Assessment/Plan: 1 ARF slowly better.  Can D/C if better in am. Vol and acid/base ok 2 CKD 3-4.  Slows recovery 3 Anemia will give iv Fe 4 morbid obesity 5 HPTH check 6 DM 7 Hydrad follow  P IV Fe, d/c ivf, follow Cr    LOS: 13 days   Kashia Brossard L 06/03/2012,11:53 AM

## 2012-06-03 NOTE — Discharge Summary (Signed)
Physician Discharge Summary  Patient ID: [name] MRN: [id number] DOB/AGE: 1961/01/29 51 y.o.  Admit date: 05/21/2012 Discharge date: 06/04/2012  Primary Care Physician:  [name], MD Primary Nephrologist: Dr Erling Cruz.  Discharge Diagnoses:    Patient Active Problem List  Diagnosis  . GOITER, MULTINODULAR  . Morbid obesity  . MOOD DISORDER IN CONDITIONS CLASSIFIED ELSEWHERE  . GERD  . Pyoderma gangrenosum  . ERYTHEMA NODOSUM, HX OF  . PSEUDOMONAS INFECTION  . Systolic congestive heart failure  . DM type 2 (diabetes mellitus, type 2)  . Chronic kidney disease (CKD), stage III (moderate)  . Iron deficiency anemia  . Acute on chronic systolic heart failure  . CHF exacerbation  . Hidradenitis suppurativa  . Anemia  . Obesity (BMI 35.0-39.9 without comorbidity)  . Diabetes mellitus type 2 in obese  . Hydronephrosis of left kidney  . Acute renal failure  . Nephrolithiasis  . ARF (acute renal failure)    Medication List  As of 06/04/2012 12:58 PM   STOP taking these medications         ascorbic acid 500 MG tablet      carvedilol 3.125 MG tablet      insulin lispro protamine-insulin lispro (75-25) 100 UNIT/ML Susp         TAKE these medications         aspirin 81 MG EC tablet   Take 1 tablet (81 mg total) by mouth daily.      carvedilol 6.25 MG tablet   Commonly known as: COREG   Take 6.25 mg by mouth 2 (two) times daily with a meal.      cyclobenzaprine 10 MG tablet   Commonly known as: FLEXERIL   Take 10 mg by mouth 3 (three) times daily as needed. Spasms      CYMBALTA 60 MG capsule   Generic drug: DULoxetine   Take 60 mg by mouth daily.      feeding supplement Liqd   Take 1 Container by mouth daily as needed (if pt request or poor PO intake).      ferrous sulfate 325 (65 FE) MG tablet   Take 325 mg by mouth 2 (two) times daily.      folic acid 1 MG tablet   Commonly known as: FOLVITE   Take 1 mg by mouth daily.      furosemide 80 MG  tablet   Commonly known as: LASIX   Take 1 tablet (80 mg total) by mouth 2 (two) times daily.      hydrALAZINE 25 MG tablet   Commonly known as: APRESOLINE   Take 37.5 mg by mouth 3 (three) times daily.      insulin aspart 100 UNIT/ML injection   Commonly known as: novoLOG   For CBG 70-120, no Insulin; CBG 121-150, 1 unit; CBG 151-200, 2 Units; CBG 201-250, 3 Units; CBG 351-300, 5 Units; CBG 301-350, 7 Units, CBG 351-400, 9 Units.      isosorbide mononitrate 30 MG 24 hr tablet   Commonly known as: IMDUR   Take 30 mg by mouth daily.      levothyroxine 25 MCG tablet   Commonly known as: SYNTHROID, LEVOTHROID   Take 25 mcg by mouth daily.      magnesium oxide 400 MG tablet   Commonly known as: MAG-OX   Take 400 mg by mouth 2 (two) times daily.      oxyCODONE-acetaminophen 5-325 MG per tablet   Commonly known as: PERCOCET   Take  1 tablet by mouth every 6 (six) hours as needed. For pain      potassium chloride SA 20 MEQ tablet   Commonly known as: K-DUR,KLOR-CON   Take 20 mEq by mouth 2 (two) times daily.      pravastatin 40 MG tablet   Commonly known as: PRAVACHOL   Take 40 mg by mouth daily.      sevelamer 800 MG tablet   Commonly known as: RENVELA   Take 3 tablets (2,400 mg total) by mouth 3 (three) times daily with meals.      sodium bicarbonate 650 MG tablet   Take 2 tablets (1,300 mg total) by mouth 2 (two) times daily.      zinc sulfate 220 MG capsule   Take 220 mg by mouth daily.             Disposition and Follow-up:  Follow up with primary MD, primary nephrologist and Urologist.   Consults:  ID, Renal, Urology, Cardiology. Dr Scharlene Gloss, ISD. Dr Marval Regal, Renal. Dr Nicki Reaper McDarmid, Urology. Dr. Daneen Schick, Cardiology.  Significant Diagnostic Studies:  Dg Chest 1 View  05/23/2012  *RADIOLOGY REPORT*  Clinical Data: Diatek catheter placement  CHEST - 1 VIEW  Comparison: Portable exam 1727 hours compared to 05/21/2012  Findings: New left jugular  large bore dual lumen central venous catheter. Tip is not well visualized due to body habitus, question projecting over the right atrium. No pneumothorax. Significant enlargement of cardiac silhouette with pulmonary vascular congestion. Low lung volumes. No gross infiltrate or effusion. Osseous mineralization normal.  IMPRESSION: No pneumothorax following Diatek catheter insertion. The tip of the catheter is suboptimally visualized due to body habitus and underpenetration, question projecting over the right atrium; if confirmation of the position of the catheter is required, recommend repeat imaging.  Original Report Authenticated By: Burnetta Sabin, M.D.    Brief H and P: For complete details, refer to admission H and P. However, in brief, this is a 51 year old with history of systolic CHF, HTN, DM 2, Morbid obesity, CKD 3, chronic iron deficiency anemia, hypothyroidism, admitted to [facility] 05/21/12, for exertional dyspnea attributable to acute CHF decompensation. In the ED she was found to have acute on chronic anemia with hemoglobin of 7.1 g/dl, from a baseline of 9.8 a month ago. She also had a bump in creatinine from a baseline of 2.4 to 4.06. She was eventually found to have a left hydronephrosis and in the face of deteriorating renal function, was subsequently transferred to [facility] on 04/28/12 to undergo percutaneous nephrostomy as well as placement of a tunneled HD cath. She was unable to tolerate either procedure due to SOB on horizontal positioning, therefore a L IJ HD cath was placed.   Physical Exam: On 06/04/12.  General: Alert, communicative, fully oriented, not short of breath at rest, comfortable.  HEENT: Moderate clinical pallor, no jaundice, no conjunctival injection or discharge. Hydration status is fair.  NECK: Supple, JVP not seen, no carotid bruits, no palpable lymphadenopathy, no palpable goiter.  CHEST: Clinically clear to auscultation, no wheezes, no crackles.    HEART: Sounds 1 and 2 heard, normal, regular, no murmurs.  ABDOMEN: Morbidly obese, soft, non-tender. Skin lesions of Hydradenitis affecting lower abdominal wall/pannus as well as groin and perineum, are dry, with only minimal exudate.Marland Kitchen  GENITALIA: Not examined.  LOWER EXTREMITIES: No pitting edema, palpable peripheral pulses.  MUSCULOSKELETAL SYSTEM: Unremarkable.  CENTRAL NERVOUS SYSTEM: No focal neurologic deficit on gross examination.  Hospital Course:  Active Problems:  1. Nonischemic cardiomyopathy: Patient presented with acute on chronic systolic CHF secondary to volume overload from kidney and urologic dysfunction and noncompliance with meds Last ejection fraction 35-40% May 2013. Cardiology consultation was provided by Dr Daneen Schick, and patient was subsequently seen by Dr Fransico Him. Managed with diuretics, but eventually required HD to control volume status. Patient is now clinically compensated. HD catheter discontinued on 05/31/12.  2. Acute on chronic kidney disease stage III: This is multifactorial, progressive CKD, related to poorly controlled chronic CHF. Patient was found to have eft hydronephrosis and hydroureter, with CT evidence of multiple renal calculi bilaterally. Following urinary stenting, creatinine has plateaued, and has finally begun trending down from a high of 6.22 on 05/27/12, to 4.98 on 06/03/12. Urine output remained good. Dr Marval Regal provided nephrology consultation.  3. Left hydronephrosis: As described above, imaging studies demonstrated obstructive uropathy/left hydronephrosis, secondary to calculi. Patient underwent cystoscopic retrograde ureterogram and left ureteral stent on 05/28/12 with good urine output. Consultation was provided by Dr Nicki Reaper McDarmid. Patient will follow up with him, on discharge.  4. Acute on chronic anemia: Patient had a profound anemia on presentation, with HB 7.1 (Baseline 9.8 in May 2013). She is status post transfusion of 2 units packed  red blood cells 05/21/12, and hemoglobin is now stable/reasonable at 8.5-8.2. Patient was given parenteral iron transfusion on 06/03/12.  5. History of hidradenitis suppurativa: Patient has a long history of hydradinitis suppurativa, Against a background of morbid obesity and followed by Dr. Tommy Medal on antibiotics. She had been referred to plastic surgery at [facility] and was told that definitive therapy can be accomplished in due course, but will require improved overall clinical status. Dr Scharlene Gloss has seen patient on this occasion, and recommended discontinuation of antibiotics. Surgical drainage will be pursued at [facility], on discharge.  6. Diabetes mellitus type 2: This is insulin-requiring. Patient has been managed with diet, SSI and Lantus during this hospitalization, with adequate control, but Lantus was discontinued on 05/28/12, due to Hypoglycemia, while n.p.o. CBGs have remained controlled, since.   Comment: Stable for discharge on 06/04/12.  Time spent on Discharge: 40 mins.  Signed: Kimeka Badour,CHRISTOPHER 06/04/2012, 12:58 PM

## 2012-06-04 LAB — CBC
HCT: 25.8 % — ABNORMAL LOW (ref 36.0–46.0)
Hemoglobin: 8 g/dL — ABNORMAL LOW (ref 12.0–15.0)
MCV: 84.6 fL (ref 78.0–100.0)
RBC: 3.05 MIL/uL — ABNORMAL LOW (ref 3.87–5.11)
WBC: 14.9 10*3/uL — ABNORMAL HIGH (ref 4.0–10.5)

## 2012-06-04 LAB — COMPREHENSIVE METABOLIC PANEL
Alkaline Phosphatase: 132 U/L — ABNORMAL HIGH (ref 39–117)
BUN: 77 mg/dL — ABNORMAL HIGH (ref 6–23)
Chloride: 104 mEq/L (ref 96–112)
GFR calc Af Amer: 11 mL/min — ABNORMAL LOW (ref >90)
Glucose, Bld: 100 mg/dL — ABNORMAL HIGH (ref 70–99)
Potassium: 4 mEq/L (ref 3.5–5.1)
Total Bilirubin: 0.2 mg/dL — ABNORMAL LOW (ref 0.3–1.2)

## 2012-06-04 LAB — GLUCOSE, CAPILLARY: Glucose-Capillary: 91 mg/dL (ref 70–99)

## 2012-06-04 MED ORDER — BOOST / RESOURCE BREEZE PO LIQD: 1.0000 | Freq: Every day | ORAL | Status: DC | PRN

## 2012-06-04 MED ORDER — SEVELAMER CARBONATE 800 MG PO TABS
2400.0000 mg | ORAL_TABLET | Freq: Three times a day (TID) | ORAL | Status: DC
  Administered 2012-06-04: 2400 mg via ORAL
  Filled 2012-06-04 (×3): qty 3

## 2012-06-04 MED ORDER — SEVELAMER CARBONATE 800 MG PO TABS: 2400.0000 mg | ORAL_TABLET | Freq: Three times a day (TID) | ORAL | Status: DC

## 2012-06-04 MED ORDER — FUROSEMIDE 80 MG PO TABS: 80.0000 mg | ORAL_TABLET | Freq: Two times a day (BID) | ORAL | Status: DC

## 2012-06-04 MED ORDER — SODIUM BICARBONATE 650 MG PO TABS: 1300.0000 mg | ORAL_TABLET | Freq: Two times a day (BID) | ORAL | Status: DC

## 2012-06-04 MED ORDER — FUROSEMIDE 80 MG PO TABS
80.0000 mg | ORAL_TABLET | Freq: Two times a day (BID) | ORAL | Status: DC
  Filled 2012-06-04 (×2): qty 1

## 2012-06-04 MED ORDER — DARBEPOETIN ALFA-POLYSORBATE 200 MCG/0.4ML IJ SOLN
200.0000 ug | Freq: Once | INTRAMUSCULAR | Status: AC
  Administered 2012-06-04: 200 ug via SUBCUTANEOUS
  Filled 2012-06-04: qty 0.4

## 2012-06-04 MED ORDER — INSULIN ASPART 100 UNIT/ML ~~LOC~~ SOLN: SUBCUTANEOUS | Status: DC

## 2012-06-04 NOTE — Progress Notes (Signed)
Subjective: Interval History: none.  Objective: Vital signs in last 24 hours: Temp:  [98.2 F (36.8 C)-98.7 F (37.1 C)] 98.4 F (36.9 C) (07/09 0943) Pulse Rate:  [69-76] 76  (07/09 0943) Resp:  [17-20] 17  (07/09 0943) BP: (125-149)/(45-77) 133/54 mmHg (07/09 0943) SpO2:  [95 %-100 %] 95 % (07/09 0943) Weight:  [162.1 kg (357 lb 5.9 oz)] 162.1 kg (357 lb 5.9 oz) (07/08 2051) Weight change: -3.8 kg (-8 lb 6 oz)  Intake/Output from previous day: 07/08 0701 - 07/09 0700 In: 770.5 [P.O.:120; I.V.:650.5] Out: 3900 [Urine:3900] Intake/Output this shift:    General appearance: alert, cooperative and morbidly obese Resp: diminished breath sounds bilaterally Cardio: S1, S2 normal and systolic murmur: holosystolic 2/6, blowing at apex GI: obese pos bs Extremities: edema 2+  Lab Results:  Basename 06/04/12 0550 06/03/12 0618  WBC 14.9* 17.2*  HGB 8.0* 8.0*  HCT 25.8* 27.0*  PLT 310 324   BMET:  Basename 06/04/12 0550 06/03/12 0618  NA 139 137  K 4.0 4.0  CL 104 102  CO2 21 20  GLUCOSE 100* 113*  BUN 77* 81*  CREATININE 4.83* 4.98*  CALCIUM 7.9* 7.7*   No results found for this basename: PTH:2 in the last 72 hours Iron Studies: No results found for this basename: IRON,TIBC,TRANSFERRIN,FERRITIN in the last 72 hours  Studies/Results: No results found.  I have reviewed the patient's current medications.  Assessment/Plan: 1 CKD/AKI Ok to D/C to outpatient F/u.  Vol ^ mod, ^ Phos will address with meds.  Will need F/U chem on Fri and next week and F/u with Dr. Bjorn Pippin 2 Anemia got Fe, give one dose epo 3 HPTH 4 Morbid obesity 5 Dm fair control  P as above, D/C epo, Lasix    LOS: 14 days   Doyne Micke L 06/04/2012,11:38 AM

## 2012-06-04 NOTE — Discharge Planning (Signed)
Pt d/c to home with family member. Tele box off, iv d/c'd, belongings with pt d/c summary completed, pt education done. vitla signs stable.

## 2012-08-04 ENCOUNTER — Other Ambulatory Visit: Payer: Self-pay | Admitting: Internal Medicine

## 2012-08-16 ENCOUNTER — Emergency Department (HOSPITAL_COMMUNITY): Payer: BC Managed Care – PPO

## 2012-08-16 ENCOUNTER — Encounter (HOSPITAL_COMMUNITY): Payer: Self-pay | Admitting: Emergency Medicine

## 2012-08-16 ENCOUNTER — Inpatient Hospital Stay (HOSPITAL_COMMUNITY)
Admission: EM | Admit: 2012-08-16 | Discharge: 2012-08-23 | DRG: 584 | Disposition: A | Discharge status: Home / Self Care | Payer: BC Managed Care – PPO | Attending: Internal Medicine | Admitting: Internal Medicine

## 2012-08-16 DIAGNOSIS — I509 Heart failure, unspecified: Secondary | ICD-10-CM | POA: Diagnosis present

## 2012-08-16 DIAGNOSIS — E039 Hypothyroidism, unspecified: Secondary | ICD-10-CM | POA: Diagnosis present

## 2012-08-16 DIAGNOSIS — L732 Hidradenitis suppurativa: Secondary | ICD-10-CM | POA: Diagnosis present

## 2012-08-16 DIAGNOSIS — Z794 Long term (current) use of insulin: Secondary | ICD-10-CM

## 2012-08-16 DIAGNOSIS — E119 Type 2 diabetes mellitus without complications: Secondary | ICD-10-CM | POA: Diagnosis present

## 2012-08-16 DIAGNOSIS — D72829 Elevated white blood cell count, unspecified: Secondary | ICD-10-CM | POA: Diagnosis present

## 2012-08-16 DIAGNOSIS — Z79899 Other long term (current) drug therapy: Secondary | ICD-10-CM

## 2012-08-16 DIAGNOSIS — N200 Calculus of kidney: Secondary | ICD-10-CM | POA: Diagnosis present

## 2012-08-16 DIAGNOSIS — A419 Sepsis, unspecified organism: Principal | ICD-10-CM | POA: Diagnosis present

## 2012-08-16 DIAGNOSIS — E875 Hyperkalemia: Secondary | ICD-10-CM | POA: Diagnosis present

## 2012-08-16 DIAGNOSIS — Z7982 Long term (current) use of aspirin: Secondary | ICD-10-CM

## 2012-08-16 DIAGNOSIS — E1169 Type 2 diabetes mellitus with other specified complication: Secondary | ICD-10-CM

## 2012-08-16 DIAGNOSIS — I120 Hypertensive chronic kidney disease with stage 5 chronic kidney disease or end stage renal disease: Secondary | ICD-10-CM | POA: Diagnosis present

## 2012-08-16 DIAGNOSIS — E669 Obesity, unspecified: Secondary | ICD-10-CM

## 2012-08-16 DIAGNOSIS — Z6841 Body Mass Index (BMI) 40.0 and over, adult: Secondary | ICD-10-CM

## 2012-08-16 DIAGNOSIS — K219 Gastro-esophageal reflux disease without esophagitis: Secondary | ICD-10-CM | POA: Diagnosis present

## 2012-08-16 DIAGNOSIS — I5023 Acute on chronic systolic (congestive) heart failure: Secondary | ICD-10-CM | POA: Diagnosis present

## 2012-08-16 DIAGNOSIS — N179 Acute kidney failure, unspecified: Secondary | ICD-10-CM | POA: Diagnosis present

## 2012-08-16 DIAGNOSIS — N186 End stage renal disease: Secondary | ICD-10-CM | POA: Diagnosis present

## 2012-08-16 DIAGNOSIS — D649 Anemia, unspecified: Secondary | ICD-10-CM | POA: Diagnosis present

## 2012-08-16 DIAGNOSIS — I959 Hypotension, unspecified: Secondary | ICD-10-CM

## 2012-08-16 DIAGNOSIS — N19 Unspecified kidney failure: Secondary | ICD-10-CM

## 2012-08-16 LAB — URINALYSIS, ROUTINE W REFLEX MICROSCOPIC
Glucose, UA: NEGATIVE mg/dL
Specific Gravity, Urine: 1.019 (ref 1.005–1.030)
pH: 5.5 (ref 5.0–8.0)

## 2012-08-16 LAB — URINE MICROSCOPIC-ADD ON

## 2012-08-16 LAB — BASIC METABOLIC PANEL
BUN: 75 mg/dL — ABNORMAL HIGH (ref 6–23)
CO2: 20 mEq/L (ref 19–32)
Chloride: 98 mEq/L (ref 96–112)
GFR calc non Af Amer: 7 mL/min — ABNORMAL LOW (ref >90)
Glucose, Bld: 165 mg/dL — ABNORMAL HIGH (ref 70–99)
Potassium: 5.1 mEq/L (ref 3.5–5.1)

## 2012-08-16 LAB — PROTIME-INR
INR: 1.4 (ref 0.00–1.49)
Prothrombin Time: 16.8 seconds — ABNORMAL HIGH (ref 11.6–15.2)

## 2012-08-16 LAB — CBC WITH DIFFERENTIAL/PLATELET
Eosinophils Absolute: 0.7 10*3/uL (ref 0.0–0.7)
Hemoglobin: 6.8 g/dL — CL (ref 12.0–15.0)
Lymphocytes Relative: 7 % — ABNORMAL LOW (ref 12–46)
Lymphs Abs: 1 10*3/uL (ref 0.7–4.0)
MCH: 26.6 pg (ref 26.0–34.0)
Monocytes Relative: 6 % (ref 3–12)
Neutrophils Relative %: 83 % — ABNORMAL HIGH (ref 43–77)
RBC: 2.56 MIL/uL — ABNORMAL LOW (ref 3.87–5.11)
WBC: 15.2 10*3/uL — ABNORMAL HIGH (ref 4.0–10.5)

## 2012-08-16 LAB — PRO B NATRIURETIC PEPTIDE: Pro B Natriuretic peptide (BNP): 26806 pg/mL — ABNORMAL HIGH (ref 0–125)

## 2012-08-16 MED ORDER — HYDROMORPHONE HCL PF 1 MG/ML IJ SOLN: 1.0000 mg | Freq: Once | INTRAMUSCULAR | Status: DC

## 2012-08-16 MED ORDER — CLINDAMYCIN PHOSPHATE 900 MG/50ML IV SOLN
900.0000 mg | Freq: Once | INTRAVENOUS | Status: AC
  Administered 2012-08-16: 900 mg via INTRAVENOUS
  Filled 2012-08-16: qty 50

## 2012-08-16 MED ORDER — VANCOMYCIN HCL IN DEXTROSE 1-5 GM/200ML-% IV SOLN: 1000.0000 mg | Freq: Once | INTRAVENOUS | Status: DC

## 2012-08-16 MED ORDER — MORPHINE SULFATE 4 MG/ML IJ SOLN
8.0000 mg | Freq: Once | INTRAMUSCULAR | Status: AC
  Administered 2012-08-16: 8 mg via INTRAVENOUS
  Filled 2012-08-16: qty 2

## 2012-08-16 MED ORDER — HYDROMORPHONE HCL PF 1 MG/ML IJ SOLN
INTRAMUSCULAR | Status: AC
  Filled 2012-08-16: qty 1

## 2012-08-16 NOTE — ED Notes (Signed)
PRBC initiated at this time, pt in no distress, IV infusing without difficulty.

## 2012-08-16 NOTE — ED Notes (Signed)
Pt sent here by PCP for low hgb and increased kidney leverls; pt c/o fatigue; pt with hx of same

## 2012-08-16 NOTE — ED Notes (Signed)
PRBC infusion rate increased. No s/s of transfusion reaction, pt VS stable. Admitting MD at bedside.

## 2012-08-16 NOTE — ED Provider Notes (Signed)
History     CSN: [id number]  Arrival date & time 08/16/12  1819   First MD Initiated Contact with Patient 08/16/12 2028      Chief Complaint  Patient presents with  . Abnormal Lab  . Weakness    (Consider location/radiation/quality/duration/timing/severity/associated sxs/prior treatment) The history is provided by the patient.  Lateefah Napoletano is a 51 y.o. female hx of HTN, DM, obesity, infected sweat glands here with anemia, renal failure. She notes that her infected groin sweat glands has been bleeding daily for the last two months. Recently, she felt more sleepy but denies chest pain or SOB. No melena and had nl colonoscopy recently. No hx of GI bleed. Denies fevers or SOB.    Past Medical History  Diagnosis Date  . Systolic congestive heart failure   . SOB (shortness of breath)   . HTN (hypertension)   . DM type 2 (diabetes mellitus, type 2)   . Chronic kidney disease (CKD), stage III (moderate)   . Iron deficiency anemia   . Morbid obesity   . Hyperkalemia   . Hypothyroidism   . CHF (congestive heart failure)     Past Surgical History  Procedure Date  . Portacath placement   . Cystoscopy w/ ureteral stent placement 05/28/2012    Procedure: CYSTOSCOPY WITH RETROGRADE PYELOGRAM/URETERAL STENT PLACEMENT;  Surgeon: Reece Packer, MD;  Location: [facility];  Service: Urology;  Laterality: Left;    Family History  Problem Relation Age of Onset  . Coronary artery disease Mother   . Hypertension Mother   . Diabetes type II Mother   . Malignant hyperthermia Mother   . Coronary artery disease Father   . Hypertension Father   . Malignant hyperthermia Father   . Cancer Maternal Grandfather     ? Type    History  Substance Use Topics  . Smoking status: Never Smoker   . Smokeless tobacco: Never Used  . Alcohol Use: No    OB History    Grav Para Term Preterm Abortions TAB SAB Ect Mult Living                  Review of Systems  Gastrointestinal: Positive for  abdominal distention.  Skin:       Bleeding from skin  All other systems reviewed and are negative.    Allergies  Amoxicillin-pot clavulanate; Rosiglitazone maleate; and Amoxicillin  Home Medications   Current Outpatient Rx  Name Route Sig Dispense Refill  . ASPIRIN 81 MG PO TBEC Oral Take 1 tablet (81 mg total) by mouth daily. 30 tablet 3  . CARVEDILOL 6.25 MG PO TABS Oral Take 6.25 mg by mouth 2 (two) times daily with a meal.    . CYCLOBENZAPRINE HCL 10 MG PO TABS Oral Take 10 mg by mouth 3 (three) times daily as needed. For Spasms    . DULOXETINE HCL 60 MG PO CPEP Oral Take 60 mg by mouth daily.      Marland Kitchen FERROUS SULFATE 325 (65 FE) MG PO TABS Oral Take 325 mg by mouth 2 (two) times daily.      Marland Kitchen FOLIC ACID 1 MG PO TABS Oral Take 1 mg by mouth daily.    . FUROSEMIDE 80 MG PO TABS Oral Take 1 tablet (80 mg total) by mouth 2 (two) times daily. 60 tablet 0  . HYDRALAZINE HCL 25 MG PO TABS Oral Take 37.5 mg by mouth 3 (three) times daily.    . INSULIN ASPART 100 UNIT/ML [place]  SOLN  For CBG 70-120, no Insulin; CBG 121-150, 1 unit; CBG 151-200, 2 Units; CBG 201-250, 3 Units; CBG 351-300, 5 Units; CBG 301-350, 7 Units, CBG 351-400, 9 Units. 1 vial 0  . ISOSORBIDE MONONITRATE ER 30 MG PO TB24 Oral Take 30 mg by mouth daily.    Marland Kitchen LEVOTHYROXINE SODIUM 25 MCG PO TABS Oral Take 25 mcg by mouth daily.    Marland Kitchen MAGNESIUM OXIDE 400 MG PO TABS Oral Take 400 mg by mouth 2 (two) times daily.     . OXYCODONE-ACETAMINOPHEN 5-325 MG PO TABS Oral Take 1 tablet by mouth every 6 (six) hours as needed. For pain    . POTASSIUM CHLORIDE CRYS ER 20 MEQ PO TBCR Oral Take 20 mEq by mouth 2 (two) times daily.    Marland Kitchen PRAVASTATIN SODIUM 40 MG PO TABS Oral Take 40 mg by mouth daily.      Marland Kitchen SEVELAMER CARBONATE 800 MG PO TABS Oral Take 3 tablets (2,400 mg total) by mouth 3 (three) times daily with meals. 180 tablet 0  . SODIUM BICARBONATE 650 MG PO TABS Oral Take 2 tablets (1,300 mg total) by mouth 2 (two) times daily. 120 tablet  0  . ZINC SULFATE 220 MG PO CAPS Oral Take 220 mg by mouth daily.      BP 106/56  Pulse 71  Temp 98.1 F (36.7 C) (Oral)  Resp 22  SpO2 98%  Physical Exam  Nursing note and vitals reviewed. Constitutional: She is oriented to person, place, and time.       Uncomfortable, chronically ill, morbidly obese  HENT:  Head: Normocephalic.  Mouth/Throat: Oropharynx is clear and moist.  Eyes: Conjunctivae normal are normal. Pupils are equal, round, and reactive to light.  Neck: Normal range of motion. Neck supple.  Cardiovascular: Normal rate, regular rhythm and normal heart sounds.   Pulmonary/Chest: Effort normal and breath sounds normal.  Abdominal:       Obese, firm, no tenderness. Under her abdominal panus, there is an area of erythema with bloody drainage. She also has an area of erythema around the buttock area with minimal fluctuance. Unable to do rectal exam due to body habitus.   Musculoskeletal: Normal range of motion.  Neurological: She is alert and oriented to person, place, and time.  Skin: Skin is warm and dry.  Psychiatric: She has a normal mood and affect. Her behavior is normal. Judgment and thought content normal.    ED Course  Procedures (including critical care time)  Labs Reviewed  CBC WITH DIFFERENTIAL - Abnormal; Notable for the following:    WBC 15.2 (*)     RBC 2.56 (*)     Hemoglobin 6.8 (*)     HCT 22.8 (*)     MCHC 29.8 (*)     RDW 16.8 (*)     Neutrophils Relative 83 (*)     Neutro Abs 12.6 (*)     Lymphocytes Relative 7 (*)     All other components within normal limits  BASIC METABOLIC PANEL - Abnormal; Notable for the following:    Sodium 132 (*)     Glucose, Bld 165 (*)     BUN 75 (*)     Creatinine, Ser 6.01 (*)     Calcium 8.2 (*)     GFR calc non Af Amer 7 (*)     GFR calc Af Amer 9 (*)     All other components within normal limits  URINALYSIS, ROUTINE W REFLEX MICROSCOPIC -  Abnormal; Notable for the following:    APPearance TURBID (*)      Hgb urine dipstick MODERATE (*)     Bilirubin Urine SMALL (*)     Protein, ur >300 (*)     Leukocytes, UA LARGE (*)     All other components within normal limits  PROTIME-INR - Abnormal; Notable for the following:    Prothrombin Time 16.8 (*)     All other components within normal limits  PRO B NATRIURETIC PEPTIDE - Abnormal; Notable for the following:    Pro B Natriuretic peptide (BNP) 26806.0 (*)     All other components within normal limits  URINE MICROSCOPIC-ADD ON - Abnormal; Notable for the following:    Bacteria, UA MANY (*)     All other components within normal limits  TYPE AND SCREEN  PREPARE RBC (CROSSMATCH)   Dg Chest Portable 1 View  08/16/2012  *RADIOLOGY REPORT*  Clinical Data: abnormal labs.  Weakness.  PORTABLE CHEST - 1 VIEW  Comparison: 05/23/2012  Findings: There is moderate cardiac enlargement.  There is no pleural effusion or edema identified.  No airspace consolidation identified.  IMPRESSION:  1.  Cardiac enlargement. 2.  No heart failure.   Original Report Authenticated By: Angelita Ingles, M.D.      No diagnosis found.    MDM  Vandana Snelling is a 50 y.o. female hx of HTN, DM, obesity here with infected groin lymph nodes, here with anemia, elevated Cr. Anemia is likely to be from the infected groin lymph nodes and will transfuse and give IV clinda. Unclear of the etiology of her elevated Cr. Will admit to inpatient for further workup.   10:51 PM Patient's labs showed WBC 15, Cr. 6.01, + UTI, BNP [id number]. CXR showed no failure. I gave her clinda and will add Vanc IV. She is transfused with 2U PRBC. Will hold off of diuresis in the setting of an infection.         Wandra Arthurs, MD 08/16/12 2256

## 2012-08-17 ENCOUNTER — Encounter (HOSPITAL_COMMUNITY): Payer: Self-pay

## 2012-08-17 DIAGNOSIS — A419 Sepsis, unspecified organism: Secondary | ICD-10-CM

## 2012-08-17 DIAGNOSIS — L732 Hidradenitis suppurativa: Secondary | ICD-10-CM

## 2012-08-17 LAB — BASIC METABOLIC PANEL
BUN: 79 mg/dL — ABNORMAL HIGH (ref 6–23)
CO2: 17 mEq/L — ABNORMAL LOW (ref 19–32)
Chloride: 98 mEq/L (ref 96–112)
Creatinine, Ser: 6.2 mg/dL — ABNORMAL HIGH (ref 0.50–1.10)
GFR calc Af Amer: 8 mL/min — ABNORMAL LOW (ref >90)
Potassium: 5.3 mEq/L — ABNORMAL HIGH (ref 3.5–5.1)

## 2012-08-17 LAB — GLUCOSE, CAPILLARY
Glucose-Capillary: 133 mg/dL — ABNORMAL HIGH (ref 70–99)
Glucose-Capillary: 128 mg/dL — ABNORMAL HIGH (ref 70–99)

## 2012-08-17 LAB — CBC
HCT: 24.8 % — ABNORMAL LOW (ref 36.0–46.0)
Hemoglobin: 7.7 g/dL — ABNORMAL LOW (ref 12.0–15.0)
MCV: 88.3 fL (ref 78.0–100.0)
RDW: 16.8 % — ABNORMAL HIGH (ref 11.5–15.5)
WBC: 15.9 10*3/uL — ABNORMAL HIGH (ref 4.0–10.5)

## 2012-08-17 MED ORDER — MAGNESIUM OXIDE 400 MG PO TABS: 400.0000 mg | ORAL_TABLET | Freq: Two times a day (BID) | ORAL | Status: DC

## 2012-08-17 MED ORDER — SODIUM CHLORIDE 0.9 % IV SOLN
2500.0000 mg | Freq: Once | INTRAVENOUS | Status: AC
  Administered 2012-08-17: 2500 mg via INTRAVENOUS
  Filled 2012-08-17: qty 2500

## 2012-08-17 MED ORDER — DEXTROSE 5 % IV SOLN
1.0000 g | INTRAVENOUS | Status: DC
  Administered 2012-08-17 – 2012-08-18 (×2): 1 g via INTRAVENOUS
  Filled 2012-08-17 (×3): qty 1

## 2012-08-17 MED ORDER — OXYCODONE-ACETAMINOPHEN 5-325 MG PO TABS
1.0000 | ORAL_TABLET | Freq: Four times a day (QID) | ORAL | Status: DC | PRN
  Administered 2012-08-17 – 2012-08-20 (×8): 1 via ORAL
  Filled 2012-08-17 (×8): qty 1

## 2012-08-17 MED ORDER — ASPIRIN EC 81 MG PO TBEC
81.0000 mg | DELAYED_RELEASE_TABLET | Freq: Every day | ORAL | Status: DC
  Administered 2012-08-17 – 2012-08-23 (×7): 81 mg via ORAL
  Filled 2012-08-17 (×7): qty 1

## 2012-08-17 MED ORDER — INSULIN ASPART 100 UNIT/ML ~~LOC~~ SOLN
6.0000 [IU] | Freq: Three times a day (TID) | SUBCUTANEOUS | Status: DC
  Administered 2012-08-17 – 2012-08-22 (×12): 6 [IU] via SUBCUTANEOUS

## 2012-08-17 MED ORDER — SODIUM BICARBONATE 650 MG PO TABS
1300.0000 mg | ORAL_TABLET | Freq: Two times a day (BID) | ORAL | Status: DC
  Administered 2012-08-17 – 2012-08-21 (×9): 1300 mg via ORAL
  Filled 2012-08-17 (×12): qty 2

## 2012-08-17 MED ORDER — CARVEDILOL 6.25 MG PO TABS
6.2500 mg | ORAL_TABLET | Freq: Two times a day (BID) | ORAL | Status: DC
Start: 2012-08-17 — End: 2012-08-17
  Filled 2012-08-17 (×3): qty 1

## 2012-08-17 MED ORDER — SEVELAMER CARBONATE 800 MG PO TABS
2400.0000 mg | ORAL_TABLET | Freq: Three times a day (TID) | ORAL | Status: DC
  Administered 2012-08-17 – 2012-08-23 (×18): 2400 mg via ORAL
  Filled 2012-08-17 (×23): qty 3

## 2012-08-17 MED ORDER — ISOSORBIDE MONONITRATE ER 30 MG PO TB24
30.0000 mg | ORAL_TABLET | Freq: Every day | ORAL | Status: DC
  Filled 2012-08-17: qty 1

## 2012-08-17 MED ORDER — INSULIN ASPART 100 UNIT/ML ~~LOC~~ SOLN
0.0000 [IU] | Freq: Three times a day (TID) | SUBCUTANEOUS | Status: DC
  Administered 2012-08-17 – 2012-08-22 (×5): 3 [IU] via SUBCUTANEOUS

## 2012-08-17 MED ORDER — DEXTROSE 5 % IV SOLN: 1.0000 g | Freq: Two times a day (BID) | INTRAVENOUS | Status: DC

## 2012-08-17 MED ORDER — SODIUM CHLORIDE 0.9 % IV SOLN: 250.0000 mL | INTRAVENOUS | Status: DC | PRN

## 2012-08-17 MED ORDER — MAGNESIUM OXIDE 400 (241.3 MG) MG PO TABS
400.0000 mg | ORAL_TABLET | Freq: Two times a day (BID) | ORAL | Status: DC
  Administered 2012-08-17 – 2012-08-21 (×10): 400 mg via ORAL
  Filled 2012-08-17 (×12): qty 1

## 2012-08-17 MED ORDER — FUROSEMIDE 80 MG PO TABS
80.0000 mg | ORAL_TABLET | Freq: Two times a day (BID) | ORAL | Status: DC
  Filled 2012-08-17 (×3): qty 1

## 2012-08-17 MED ORDER — DULOXETINE HCL 60 MG PO CPEP
60.0000 mg | ORAL_CAPSULE | Freq: Every day | ORAL | Status: DC
Start: 2012-08-17 — End: 2012-08-23
  Administered 2012-08-17 – 2012-08-23 (×7): 60 mg via ORAL
  Filled 2012-08-17 (×7): qty 1

## 2012-08-17 MED ORDER — CYCLOBENZAPRINE HCL 10 MG PO TABS: 10.0000 mg | ORAL_TABLET | Freq: Three times a day (TID) | ORAL | Status: DC | PRN

## 2012-08-17 MED ORDER — SODIUM CHLORIDE 0.9 % IJ SOLN: 3.0000 mL | INTRAMUSCULAR | Status: DC | PRN

## 2012-08-17 MED ORDER — FOLIC ACID 1 MG PO TABS
1.0000 mg | ORAL_TABLET | Freq: Every day | ORAL | Status: DC
  Administered 2012-08-17 – 2012-08-23 (×7): 1 mg via ORAL
  Filled 2012-08-17 (×7): qty 1

## 2012-08-17 MED ORDER — HYDROMORPHONE HCL PF 1 MG/ML IJ SOLN
1.0000 mg | INTRAMUSCULAR | Status: DC | PRN
  Administered 2012-08-21 – 2012-08-22 (×2): 2 mg via INTRAVENOUS
  Filled 2012-08-17: qty 2
  Filled 2012-08-17: qty 1
  Filled 2012-08-17: qty 2

## 2012-08-17 MED ORDER — SODIUM CHLORIDE 0.9 % IJ SOLN
3.0000 mL | Freq: Two times a day (BID) | INTRAMUSCULAR | Status: DC
  Administered 2012-08-17 – 2012-08-22 (×7): 3 mL via INTRAVENOUS

## 2012-08-17 MED ORDER — INSULIN ASPART 100 UNIT/ML ~~LOC~~ SOLN: 0.0000 [IU] | Freq: Every day | SUBCUTANEOUS | Status: DC

## 2012-08-17 MED ORDER — HYDRALAZINE HCL 25 MG PO TABS
37.5000 mg | ORAL_TABLET | Freq: Three times a day (TID) | ORAL | Status: DC
  Filled 2012-08-17 (×3): qty 1.5

## 2012-08-17 MED ORDER — ZINC SULFATE 220 (50 ZN) MG PO CAPS
220.0000 mg | ORAL_CAPSULE | Freq: Every day | ORAL | Status: DC
  Administered 2012-08-17 – 2012-08-23 (×7): 220 mg via ORAL
  Filled 2012-08-17 (×7): qty 1

## 2012-08-17 MED ORDER — HEPARIN SODIUM (PORCINE) 5000 UNIT/ML IJ SOLN
5000.0000 [IU] | Freq: Three times a day (TID) | INTRAMUSCULAR | Status: DC
  Administered 2012-08-17 – 2012-08-23 (×19): 5000 [IU] via SUBCUTANEOUS
  Filled 2012-08-17 (×22): qty 1

## 2012-08-17 MED ORDER — FERROUS SULFATE 325 (65 FE) MG PO TABS
325.0000 mg | ORAL_TABLET | Freq: Two times a day (BID) | ORAL | Status: DC
  Administered 2012-08-17 – 2012-08-21 (×9): 325 mg via ORAL
  Filled 2012-08-17 (×14): qty 1

## 2012-08-17 MED ORDER — SIMVASTATIN 20 MG PO TABS
20.0000 mg | ORAL_TABLET | Freq: Every day | ORAL | Status: DC
  Administered 2012-08-17 – 2012-08-22 (×6): 20 mg via ORAL
  Filled 2012-08-17 (×7): qty 1

## 2012-08-17 MED ORDER — SODIUM CHLORIDE 0.9 % IV BOLUS (SEPSIS)
500.0000 mL | Freq: Once | INTRAVENOUS | Status: AC
  Administered 2012-08-17: 500 mL via INTRAVENOUS

## 2012-08-17 MED ORDER — LEVOTHYROXINE SODIUM 25 MCG PO TABS
25.0000 ug | ORAL_TABLET | Freq: Every day | ORAL | Status: DC
  Administered 2012-08-17 – 2012-08-18 (×2): 25 ug via ORAL
  Filled 2012-08-17 (×3): qty 1

## 2012-08-17 NOTE — Progress Notes (Signed)
[facility]             Progress Note   Subjective: Lying in bed, obese, has no significant complaints. Was called by RN, because her BP has been dropping steadily and is now in the 80s/50s.  Objective: Vital signs in last 24 hours: Temp:  [97.2 F (36.2 C)-98.5 F (36.9 C)] 97.2 F (36.2 C) (09/21 0858) Pulse Rate:  [68-143] 73  (09/21 0858) Resp:  [19-24] 20  (09/21 0858) BP: (81-117)/(42-72) 84/52 mmHg (09/21 1020) SpO2:  [90 %-98 %] 90 % (09/21 0435) Weight:  [162 kg (357 lb 2.3 oz)] 162 kg (357 lb 2.3 oz) (09/21 0020) Weight change:  Last BM Date: 08/16/12  Intake/Output from previous day: 09/20 0701 - 09/21 0700 In: 818.3 [P.O.:120; I.V.:50; Blood:648.3] Out: 25 [Urine:25]     Physical Exam: General: Alert, awake, oriented x3, morbidly obese. HEENT: No bruits, no goiter. Heart: Regular rate and rhythm, without murmurs, rubs, gallops. Lungs: Clear to auscultation bilaterally. Abdomen: Obese,Soft, nontender, nondistended, positive bowel sounds. Extremities: No clubbing cyanosis or edema with positive pedal pulses. Neuro: Grossly intact, nonfocal. She has not been ambulated. Skin: Has multiple areas of active hydradenitis under her breasts, pannus and worse in her groin area. They are all purulent and foul-smelling.     Lab Results: Basic Metabolic Panel:  Basename 08/17/12 0815 08/16/12 1830  NA 129* 132*  K 5.3* 5.1  CL 98 98  CO2 17* 20  GLUCOSE 126* 165*  BUN 79* 75*  CREATININE 6.20* 6.01*  CALCIUM 7.7* 8.2*  MG -- --  PHOS -- --   CBC:  Basename 08/17/12 0815 08/16/12 1830  WBC 15.9* 15.2*  NEUTROABS -- 12.6*  HGB 7.7* 6.8*  HCT 24.8* 22.8*  MCV 88.3 89.1  PLT 288 315   BNP:  Basename 08/16/12 2049  PROBNP 26806.0*   D-Dimer: No results found for this basename: DDIMER:2 in the last 72 hours CBG:  Basename 08/17/12 0740 08/17/12 0033  GLUCAP 133* 152*   Coagulation:  Basename 08/16/12 2047  LABPROT 16.8*  INR 1.40    Urinalysis:  Basename 08/16/12 2127  COLORURINE YELLOW  LABSPEC 1.019  PHURINE 5.5  GLUCOSEU NEGATIVE  HGBUR MODERATE*  BILIRUBINUR SMALL*  KETONESUR NEGATIVE  PROTEINUR >300*  UROBILINOGEN 0.2  NITRITE NEGATIVE  LEUKOCYTESUR LARGE*    Studies/Results: Dg Chest Portable 1 View  08/16/2012  *RADIOLOGY REPORT*  Clinical Data: abnormal labs.  Weakness.  PORTABLE CHEST - 1 VIEW  Comparison: 05/23/2012  Findings: There is moderate cardiac enlargement.  There is no pleural effusion or edema identified.  No airspace consolidation identified.  IMPRESSION:  1.  Cardiac enlargement. 2.  No heart failure.   Original Report Authenticated By: Angelita Ingles, M.D.     Medications: Scheduled Meds:    . aspirin EC  81 mg Oral Daily  . carvedilol  6.25 mg Oral BID WC  . ceFEPime (MAXIPIME) IV  1 g Intravenous Q24H  . clindamycin (CLEOCIN) IV  900 mg Intravenous Once  . DULoxetine  60 mg Oral Daily  . ferrous sulfate  325 mg Oral BID AC  . folic acid  1 mg Oral Daily  . furosemide  80 mg Oral BID  . heparin  5,000 Units Subcutaneous Q8H  . hydrALAZINE  37.5 mg Oral TID  .  HYDROmorphone (DILAUDID) injection  1 mg Intravenous Once  . insulin aspart  0-20 Units Subcutaneous TID WC  . insulin aspart  0-5 Units Subcutaneous QHS  .  insulin aspart  6 Units Subcutaneous TID WC  . isosorbide mononitrate  30 mg Oral Daily  . levothyroxine  25 mcg Oral QAC breakfast  . magnesium oxide  400 mg Oral BID  .  morphine injection  8 mg Intravenous Once  . sevelamer  2,400 mg Oral TID WC  . simvastatin  20 mg Oral q1800  . sodium bicarbonate  1,300 mg Oral BID  . sodium chloride  3 mL Intravenous Q12H  . vancomycin  2,500 mg Intravenous Once  . zinc sulfate  220 mg Oral Daily  . DISCONTD: ceFEPime (MAXIPIME) IV  1 g Intravenous Q12H  . DISCONTD: magnesium oxide  400 mg Oral BID  . DISCONTD: magnesium oxide  400 mg Oral BID  . DISCONTD: vancomycin  1,000 mg Intravenous Once   Continuous  Infusions:  PRN Meds:.sodium chloride, cyclobenzaprine, HYDROmorphone (DILAUDID) injection, oxyCODONE-acetaminophen, sodium chloride  Assessment/Plan:  Principal Problem:  *Hidradenitis suppurativa Active Problems:  Morbid obesity  Chronic kidney disease (CKD), stage III (moderate)  Anemia  ARF (acute renal failure)  Sepsis associated hypotension  Sepsis with hypotension -Will hold all BP meds including: coreg, lasix, imdur, hydralazine. -Judicious morphine use. -Give a 500 cc bolus of saline. -I believe 2/2 severe hydradenitis suppurativa.   Hydradenitis Suppurative -Per patient has been seen at [facility] and was recommended to have some sort of surgical procedure that she can not afford. -Given what seems like early sepsis, will ask general surgery to evaluate. Dr. Barry Dienes has been contacted and has graciously agreed to see this patient in consultation. -Continue broad spectrum antibiotics with vanc and cefepime for now.  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 -Follows with Dr. Florene Glen outpatient. -Will give a 500cc bolus as she appears quite dry clinically and hypotensive, -Follow renal function.  Hypothyroidism -Continue home synthroid dose. -Check TSH.  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 -Has received 2 u PRBCs. -Recheck Hb in am to see if further transfusion required, Hb only increased to 7.7 from 6.8 after 2 units. -No signs of active bleeding.  Time spent coordinating care: 35 minutes.   LOS: 1 day   [facility] Pager: [phone] 08/17/2012, 10:39 AM

## 2012-08-17 NOTE — Consult Note (Signed)
Reason for Consult: Multiple areas of suppurative hyadenitis Referring Physician: Donika Lipsett is an 51 y.o. female.  HPI: [name] is an 51 y.o. female Morbidly obese, with chronic panus and hx of supurative hyadenitis, s/p I and D, and prior antibiotics, seen in surgical consultation at [facility], but has not been able to get surgery done because of cost, hx of CKD and resulting anemia, steadily having decreasing Hb, presents to the ER at the recommendation of her PCP for Hb of 6 g/DL. She did have an increase in the drainage of her chronic infection with increase foul smelling oder. She did not have fever, chills, chest pain or shortness of breath. We are asked to see the patient on consult for treatment recommendations. It was explained in detail to the patient by Dr. Barry Dienes that these areas of infection would benefit from  I&D and that there was a chance that they would reoccur. It was also explained to the patient that our collective concern was that these abscess were causing her to have low blood pressure issues and elevated white cell counts  and that she could potentially develop a life threating infection if these abscess were not treated agressively with antibiotics and I&D.  Patient acknowledged her understanding of this explanation, and stated that she has had these same issues in the past over the years, has had them drained and excised before this time. Presently she is refusing any further surgery. She states that they seem to get worse each time she has this procedure.  She denies being on any recent antibiotic therapy recently. Patient was told that we would follow up with her tomorrow to discuss this issue again with her and to allow her time to think over her decision further.   Past Medical History  Diagnosis Date  . Systolic congestive heart failure   . SOB (shortness of breath)   . HTN (hypertension)   . DM type 2 (diabetes mellitus, type 2)   . Chronic kidney  disease (CKD), stage III (moderate)   . Iron deficiency anemia   . Morbid obesity   . Hyperkalemia   . Hypothyroidism   . CHF (congestive heart failure)     Past Surgical History  Procedure Date  . Portacath placement   . Cystoscopy w/ ureteral stent placement 05/28/2012    Procedure: CYSTOSCOPY WITH RETROGRADE PYELOGRAM/URETERAL STENT PLACEMENT;  Surgeon: Reece Packer, MD;  Location: [facility];  Service: Urology;  Laterality: Left;    Family History  Problem Relation Age of Onset  . Coronary artery disease Mother   . Hypertension Mother   . Diabetes type II Mother   . Malignant hyperthermia Mother   . Coronary artery disease Father   . Hypertension Father   . Malignant hyperthermia Father   . Cancer Maternal Grandfather     ? Type    Social History:  reports that she has never smoked. She has never used smokeless tobacco. She reports that she does not drink alcohol or use illicit drugs.  Allergies:  Allergies  Allergen Reactions  . Amoxicillin-Pot Clavulanate Diarrhea  . Rosiglitazone Maleate     REACTION: swelling  . Amoxicillin Rash    Medications: I have reviewed the patient's current medications.  Results for orders placed during the hospital encounter of 08/16/12 (from the past 48 hour(s))  CBC WITH DIFFERENTIAL     Status: Abnormal   Collection Time   08/16/12  6:30 PM      Component  Value Range Comment   WBC 15.2 (*) 4.0 - 10.5 K/uL    RBC 2.56 (*) 3.87 - 5.11 MIL/uL    Hemoglobin 6.8 (*) 12.0 - 15.0 g/dL    HCT 22.8 (*) 36.0 - 46.0 %    MCV 89.1  78.0 - 100.0 fL    MCH 26.6  26.0 - 34.0 pg    MCHC 29.8 (*) 30.0 - 36.0 g/dL    RDW 16.8 (*) 11.5 - 15.5 %    Platelets 315  150 - 400 K/uL    Neutrophils Relative 83 (*) 43 - 77 %    Neutro Abs 12.6 (*) 1.7 - 7.7 K/uL    Lymphocytes Relative 7 (*) 12 - 46 %    Lymphs Abs 1.0  0.7 - 4.0 K/uL    Monocytes Relative 6  3 - 12 %    Monocytes Absolute 0.9  0.1 - 1.0 K/uL    Eosinophils Relative 5  0 - 5 %     Eosinophils Absolute 0.7  0.0 - 0.7 K/uL    Basophils Relative 0  0 - 1 %    Basophils Absolute 0.0  0.0 - 0.1 K/uL   BASIC METABOLIC PANEL     Status: Abnormal   Collection Time   08/16/12  6:30 PM      Component Value Range Comment   Sodium 132 (*) 135 - 145 mEq/L    Potassium 5.1  3.5 - 5.1 mEq/L    Chloride 98  96 - 112 mEq/L    CO2 20  19 - 32 mEq/L    Glucose, Bld 165 (*) 70 - 99 mg/dL    BUN 75 (*) 6 - 23 mg/dL    Creatinine, Ser 6.01 (*) 0.50 - 1.10 mg/dL    Calcium 8.2 (*) 8.4 - 10.5 mg/dL    GFR calc non Af Amer 7 (*) >90 mL/min    GFR calc Af Amer 9 (*) >90 mL/min   TYPE AND SCREEN     Status: Normal (Preliminary result)   Collection Time   08/16/12  8:44 PM      Component Value Range Comment   ABO/RH(D) B POS      Antibody Screen NEG      Sample Expiration 08/19/2012      Unit Number [id number]      Blood Component Type RED CELLS,LR      Unit division 00      Status of Unit ISSUED      Transfusion Status OK TO TRANSFUSE      Crossmatch Result Compatible      Unit Number [id number]      Blood Component Type RED CELLS,LR      Unit division 00      Status of Unit ISSUED      Transfusion Status OK TO TRANSFUSE      Crossmatch Result Compatible     PREPARE RBC (CROSSMATCH)     Status: Normal   Collection Time   08/16/12  8:44 PM      Component Value Range Comment   Order Confirmation ORDER PROCESSED BY BLOOD BANK     PROTIME-INR     Status: Abnormal   Collection Time   08/16/12  8:47 PM      Component Value Range Comment   Prothrombin Time 16.8 (*) 11.6 - 15.2 seconds    INR 1.40  0.00 - 1.49   PRO B NATRIURETIC PEPTIDE     Status: Abnormal  Collection Time   08/16/12  8:49 PM      Component Value Range Comment   Pro B Natriuretic peptide (BNP) 26806.0 (*) 0 - 125 pg/mL   URINALYSIS, ROUTINE W REFLEX MICROSCOPIC     Status: Abnormal   Collection Time   08/16/12  9:27 PM      Component Value Range Comment   Color, Urine YELLOW  YELLOW    APPearance  TURBID (*) CLEAR    Specific Gravity, Urine 1.019  1.005 - 1.030    pH 5.5  5.0 - 8.0    Glucose, UA NEGATIVE  NEGATIVE mg/dL    Hgb urine dipstick MODERATE (*) NEGATIVE    Bilirubin Urine SMALL (*) NEGATIVE    Ketones, ur NEGATIVE  NEGATIVE mg/dL    Protein, ur >300 (*) NEGATIVE mg/dL    Urobilinogen, UA 0.2  0.0 - 1.0 mg/dL    Nitrite NEGATIVE  NEGATIVE    Leukocytes, UA LARGE (*) NEGATIVE   URINE MICROSCOPIC-ADD ON     Status: Abnormal   Collection Time   08/16/12  9:27 PM      Component Value Range Comment   Squamous Epithelial / LPF RARE  RARE    WBC, UA TOO NUMEROUS TO COUNT  <3 WBC/hpf    RBC / HPF 7-10  <3 RBC/hpf    Bacteria, UA MANY (*) RARE   GLUCOSE, CAPILLARY     Status: Abnormal   Collection Time   08/17/12 12:33 AM      Component Value Range Comment   Glucose-Capillary 152 (*) 70 - 99 mg/dL    Comment 1 Documented in Chart      Comment 2 Notify RN     GLUCOSE, CAPILLARY     Status: Abnormal   Collection Time   08/17/12  7:40 AM      Component Value Range Comment   Glucose-Capillary 133 (*) 70 - 99 mg/dL   BASIC METABOLIC PANEL     Status: Abnormal   Collection Time   08/17/12  8:15 AM      Component Value Range Comment   Sodium 129 (*) 135 - 145 mEq/L    Potassium 5.3 (*) 3.5 - 5.1 mEq/L    Chloride 98  96 - 112 mEq/L    CO2 17 (*) 19 - 32 mEq/L    Glucose, Bld 126 (*) 70 - 99 mg/dL    BUN 79 (*) 6 - 23 mg/dL    Creatinine, Ser 6.20 (*) 0.50 - 1.10 mg/dL    Calcium 7.7 (*) 8.4 - 10.5 mg/dL    GFR calc non Af Amer 7 (*) >90 mL/min    GFR calc Af Amer 8 (*) >90 mL/min   CBC     Status: Abnormal   Collection Time   08/17/12  8:15 AM      Component Value Range Comment   WBC 15.9 (*) 4.0 - 10.5 K/uL    RBC 2.81 (*) 3.87 - 5.11 MIL/uL    Hemoglobin 7.7 (*) 12.0 - 15.0 g/dL    HCT 24.8 (*) 36.0 - 46.0 %    MCV 88.3  78.0 - 100.0 fL    MCH 27.4  26.0 - 34.0 pg    MCHC 31.0  30.0 - 36.0 g/dL    RDW 16.8 (*) 11.5 - 15.5 %    Platelets 288  150 - 400 K/uL     GLUCOSE, CAPILLARY     Status: Abnormal   Collection Time   08/17/12  10:07 AM      Component Value Range Comment   Glucose-Capillary 114 (*) 70 - 99 mg/dL   GLUCOSE, CAPILLARY     Status: Abnormal   Collection Time   08/17/12 11:29 AM      Component Value Range Comment   Glucose-Capillary 128 (*) 70 - 99 mg/dL     Dg Chest Portable 1 View  08/16/2012  *RADIOLOGY REPORT*  Clinical Data: abnormal labs.  Weakness.  PORTABLE CHEST - 1 VIEW  Comparison: 05/23/2012  Findings: There is moderate cardiac enlargement.  There is no pleural effusion or edema identified.  No airspace consolidation identified.  IMPRESSION:  1.  Cardiac enlargement. 2.  No heart failure.   Original Report Authenticated By: Angelita Ingles, M.D.     Review of Systems  Constitutional: Negative.   HENT: Negative.   Eyes: Negative.   Respiratory: Negative.   Cardiovascular: Negative.   Gastrointestinal: Negative.   Genitourinary: Negative.   Musculoskeletal: Negative.   Skin: Negative.   Neurological: Negative.   Endo/Heme/Allergies: Negative.   Psychiatric/Behavioral: Negative.    Blood pressure 100/51, pulse 73, temperature 98.3 F (36.8 C), temperature source Oral, resp. rate 22, height 5\' 6"  (1.676 m), weight 357 lb 2.3 oz (162 kg), SpO2 90.00%. Physical Exam  Constitutional: She is oriented to person, place, and time. She appears well-developed and well-nourished. No distress.       Morbidly obese  HENT:  Head: Normocephalic and atraumatic.  Mouth/Throat: No oropharyngeal exudate.  Eyes: Pupils are equal, round, and reactive to light. Right eye exhibits no discharge. Left eye exhibits no discharge. No scleral icterus.  Neck: Normal range of motion. Neck supple. No JVD present. No tracheal deviation present. No thyromegaly present.  Cardiovascular: Normal rate, regular rhythm, normal heart sounds and intact distal pulses.  Exam reveals no gallop and no friction rub.   No murmur heard. Respiratory: Effort  normal and breath sounds normal. No stridor. No respiratory distress. She has no wheezes. She has no rales. She exhibits no tenderness.  GI: Soft. Bowel sounds are normal. She exhibits no distension and no mass. There is no tenderness. There is no rebound and no guarding.  Musculoskeletal: She exhibits no edema and no tenderness.       Unable to fully assess patient refused to roll for exam and other movements were painfull secondary to abscesses.  Lymphadenopathy:    She has no cervical adenopathy.  Neurological: She is alert and oriented to person, place, and time.  Skin: Skin is warm and dry. No rash noted. She is not diaphoretic. There is erythema. No pallor.       Multiple abscess: Left flank Left Buttock Left perineum Left groin Right groin Left breast Left leg and probably other areas that we were unable to assess because patient will not move for [place].   Psychiatric: She has a normal mood and affect.    Assessment/Plan:  Patient Active Problem List  Diagnosis  . GOITER, MULTINODULAR  . Morbid obesity  . MOOD DISORDER IN CONDITIONS CLASSIFIED ELSEWHERE  . GERD  . Pyoderma gangrenosum  . ERYTHEMA NODOSUM, HX OF  . PSEUDOMONAS INFECTION  . Systolic congestive heart failure  . DM type 2 (diabetes mellitus, type 2)  . Chronic kidney disease (CKD), stage III (moderate)  . Iron deficiency anemia  . Acute on chronic systolic heart failure  . CHF exacerbation  . Hidradenitis suppurativa  . Anemia  . Obesity (BMI 35.0-39.9 without comorbidity)  . Diabetes mellitus  type 2 in obese  . Hydronephrosis of left kidney  . Acute renal failure  . Nephrolithiasis  . ARF (acute renal failure)  . Sepsis associated hypotension   Multiple areas of hyadrinitis with fluctuance and suppurative drainage. Hypotention  Leukocytosis Recommend continuing IVF, ABX Patient needs Extensive I&D's (presently refusing tx) Management of all her other medical issues per medicine team.   Thank  you for this consult, we will check in on her tomorrow to see if she is more amenable to having these I&D's done.  If she does we would most likely schedule her for early next week.  Robinette Haines 08/17/2012, 3:27 PM

## 2012-08-17 NOTE — H&P (Signed)
[facility] History and Physical  [name] DOB: [birth year]    PCP:   [name], MD   Chief Complaint: anemia.  HPI: [name] is an 51 y.o. female  Morbidly obese, with chronic panus and hx of supurative hyadenitis, s/p I and D, and prior antibiotics, seen in surgical consultation at [facility], but has not been able to get surgery done because of cost, hx of CKD and resulting anemia, steadily having decreasing Hb, presents to the ER at the recommendation of her PCP for Hb of 6 g/DL.  She did have an increase in the drainage of her chronic infection with increase foul smelling oder.  She did not have fever, chills, chest pain or shortness of breath.  Rewiew of Systems:  Constitutional: Negative for malaise, fever and chills. No significant weight loss or weight gain Eyes: Negative for eye pain, redness and discharge, diplopia, visual changes, or flashes of light. ENMT: Negative for ear pain, hoarseness, nasal congestion, sinus pressure and sore throat. No headaches; tinnitus, drooling, or problem swallowing. Cardiovascular: Negative for chest pain, palpitations, diaphoresis, dyspnea and peripheral edema. ; No orthopnea, PND Respiratory: Negative for cough, hemoptysis, wheezing and stridor. No pleuritic chestpain. Gastrointestinal: Negative for nausea, vomiting, diarrhea, constipation, abdominal pain, melena, blood in stool, hematemesis, jaundice and rectal bleeding.    Genitourinary: Negative for frequency, dysuria, incontinence,flank pain and hematuria; Musculoskeletal: Negative for back pain and neck pain. Negative for swelling and trauma.;  Skin: . Negative for pruritus, rash, abrasions, bruising and skin lesion.; ulcerations Neuro: Negative for headache, lightheadedness and neck stiffness. Negative for weakness, altered level of consciousness , altered mental status, extremity weakness, burning feet, involuntary movement, seizure and syncope.  Psych: negative  for anxiety, depression, insomnia, tearfulness, panic attacks, hallucinations, paranoia, suicidal or homicidal ideation    Past Medical History  Diagnosis Date  . Systolic congestive heart failure   . SOB (shortness of breath)   . HTN (hypertension)   . DM type 2 (diabetes mellitus, type 2)   . Chronic kidney disease (CKD), stage III (moderate)   . Iron deficiency anemia   . Morbid obesity   . Hyperkalemia   . Hypothyroidism   . CHF (congestive heart failure)     Past Surgical History  Procedure Date  . Portacath placement   . Cystoscopy w/ ureteral stent placement 05/28/2012    Procedure: CYSTOSCOPY WITH RETROGRADE PYELOGRAM/URETERAL STENT PLACEMENT;  Surgeon: Reece Packer, MD;  Location: [facility];  Service: Urology;  Laterality: Left;    Medications:  HOME MEDS: Prior to Admission medications   Medication Sig Start Date End Date Taking? Authorizing Provider  aspirin EC 81 MG EC tablet Take 1 tablet (81 mg total) by mouth daily. 01/30/12 01/29/13 Yes Ripudeep Krystal Eaton, MD  carvedilol (COREG) 6.25 MG tablet Take 6.25 mg by mouth 2 (two) times daily with a meal.   Yes Historical Provider, MD  cyclobenzaprine (FLEXERIL) 10 MG tablet Take 10 mg by mouth 3 (three) times daily as needed. For Spasms   Yes Historical Provider, MD  DULoxetine (CYMBALTA) 60 MG capsule Take 60 mg by mouth daily.     Yes Historical Provider, MD  ferrous sulfate 325 (65 FE) MG tablet Take 325 mg by mouth 2 (two) times daily.     Yes Historical Provider, MD  folic acid (FOLVITE) 1 MG tablet Take 1 mg by mouth daily.   Yes Historical Provider, MD  furosemide (LASIX) 80 MG tablet Take 1 tablet (80 mg total) by  mouth 2 (two) times daily. 06/04/12 06/04/13 Yes Monika Salk, MD  hydrALAZINE (APRESOLINE) 25 MG tablet Take 37.5 mg by mouth 3 (three) times daily.   Yes Historical Provider, MD  insulin aspart (NOVOLOG) 100 UNIT/ML injection For CBG 70-120, no Insulin; CBG 121-150, 1 unit; CBG 151-200, 2 Units; CBG 201-250, 3  Units; CBG 351-300, 5 Units; CBG 301-350, 7 Units, CBG 351-400, 9 Units. 06/04/12  Yes Monika Salk, MD  isosorbide mononitrate (IMDUR) 30 MG 24 hr tablet Take 30 mg by mouth daily.   Yes Historical Provider, MD  levothyroxine (SYNTHROID, LEVOTHROID) 25 MCG tablet Take 25 mcg by mouth daily.   Yes Historical Provider, MD  magnesium oxide (MAG-OX) 400 MG tablet Take 400 mg by mouth 2 (two) times daily.    Yes Historical Provider, MD  oxyCODONE-acetaminophen (PERCOCET) 5-325 MG per tablet Take 1 tablet by mouth every 6 (six) hours as needed. For pain   Yes Historical Provider, MD  potassium chloride SA (K-DUR,KLOR-CON) 20 MEQ tablet Take 20 mEq by mouth 2 (two) times daily.   Yes Historical Provider, MD  pravastatin (PRAVACHOL) 40 MG tablet Take 40 mg by mouth daily.     Yes Historical Provider, MD  sevelamer (RENVELA) 800 MG tablet Take 3 tablets (2,400 mg total) by mouth 3 (three) times daily with meals. 06/04/12 06/04/13 Yes Monika Salk, MD  sodium bicarbonate 650 MG tablet Take 2 tablets (1,300 mg total) by mouth 2 (two) times daily. 06/04/12 06/04/13 Yes Monika Salk, MD  zinc sulfate 220 MG capsule Take 220 mg by mouth daily.   Yes Historical Provider, MD     Allergies:  Allergies  Allergen Reactions  . Amoxicillin-Pot Clavulanate Diarrhea  . Rosiglitazone Maleate     REACTION: swelling  . Amoxicillin Rash    Social History:   reports that she has never smoked. She has never used smokeless tobacco. She reports that she does not drink alcohol or use illicit drugs.  Family History: Family History  Problem Relation Age of Onset  . Coronary artery disease Mother   . Hypertension Mother   . Diabetes type II Mother   . Malignant hyperthermia Mother   . Coronary artery disease Father   . Hypertension Father   . Malignant hyperthermia Father   . Cancer Maternal Grandfather     ? Type     Physical Exam: Filed Vitals:   08/16/12 2230 08/16/12 2258 08/16/12 2339 08/17/12 0020  BP: 106/56  108/58 97/56 112/52  Pulse:   69 143  Temp: 98.1 F (36.7 C) 98.4 F (36.9 C)  97.6 F (36.4 C)  TempSrc:    Oral  Resp:   19 20  Height:    5\' 6"  (1.676 m)  Weight:    162 kg (357 lb 2.3 oz)  SpO2:   96% 98%   Blood pressure 112/52, pulse 143, temperature 97.6 F (36.4 C), temperature source Oral, resp. rate 20, height 5\' 6"  (1.676 m), weight 162 kg (357 lb 2.3 oz), SpO2 98.00%.  GEN:  Pleasant  patient lying in the stretcher in no acute distress; cooperative with exam. She is morbidly obese. PSYCH:  alert and oriented x4; does not appear anxious or depressed; affect is appropriate. HEENT: Mucous membranes pink and anicteric; PERRLA; EOM intact; no cervical lymphadenopathy nor thyromegaly or carotid bruit; no JVD; There were no stridor. Neck is very supple. Breasts:: Not examined CHEST WALL: No tenderness CHEST: Normal respiration, clear to auscultation bilaterally.  HEART: Regular  rate and rhythm.  There are no murmur, rub, or gallops.   BACK: No kyphosis or scoliosis; no CVA tenderness ABDOMEN: soft and tender anteriorly, no organomegaly, normal abdominal bowel sounds;There is no rebound and no distention. She has ulcer in the panus area.  She also has tenderness over the buttock area as well. Rectal Exam: Not done EXTREMITIES: No bone or joint deformity; age-appropriate arthropathy of the hands and knees; no edema; no ulcerations.  There is no calf tenderness. Genitalia: not examined PULSES: 2+ and symmetric SKIN: Normal hydration no rash or ulceration CNS: Cranial nerves 2-12 grossly intact no focal lateralizing neurologic deficit.  Speech is fluent; uvula elevated with phonation, facial symmetry and tongue midline. DTR are normal bilaterally, cerebella exam is intact, barbinski is negative and strengths are equaled bilaterally.  No sensory loss.   Labs on Admission:  Basic Metabolic Panel:  Lab [id number] 1830  NA 132*  K 5.1  CL 98  CO2 20  GLUCOSE 165*  BUN 75*    CREATININE 6.01*  CALCIUM 8.2*  MG --  PHOS --   Liver Function Tests: No results found for this basename: AST:5,ALT:5,ALKPHOS:5,BILITOT:5,PROT:5,ALBUMIN:5 in the last 168 hours No results found for this basename: LIPASE:5,AMYLASE:5 in the last 168 hours No results found for this basename: AMMONIA:5 in the last 168 hours CBC:  Lab 08/16/12 1830  WBC 15.2*  NEUTROABS 12.6*  HGB 6.8*  HCT 22.8*  MCV 89.1  PLT 315   Cardiac Enzymes: No results found for this basename: CKTOTAL:5,CKMB:5,CKMBINDEX:5,TROPONINI:5 in the last 168 hours  CBG:  Lab 08/17/12 0033  GLUCAP 152*     Radiological Exams on Admission: Dg Chest Portable 1 View  08/16/2012  *RADIOLOGY REPORT*  Clinical Data: abnormal labs.  Weakness.  PORTABLE CHEST - 1 VIEW  Comparison: 05/23/2012  Findings: There is moderate cardiac enlargement.  There is no pleural effusion or edema identified.  No airspace consolidation identified.  IMPRESSION:  1.  Cardiac enlargement. 2.  No heart failure.   Original Report Authenticated By: Angelita Ingles, M.D.        Assessment/Plan Present on Admission:  .Morbid obesity .Chronic kidney disease (CKD), stage III (moderate) .Hidradenitis suppurativa .Anemia .ARF (acute renal failure)   PLAN:  She needs definitive surgery and has seen surgeon at [facility].  Unfortunately, she had she cannot proceed with surgery because she is not able to afford her copay.  I told her she will need surgery regardless.  For now, she will be started on Van and Cefepime.  She did have pseudomonas infection before, and is allergic to PCN.  She will be given 2 units of PRBCs and hopefully with intravascular volume expansion, her Cr will improve.  She is stable, full code, and will be admitted to [facility] service.   Other plans as per orders.  Code Status: FULL Haskel Khan, MD. [facility] Pager [phone] 7pm to 7am.  08/17/2012, 1:27 AM

## 2012-08-17 NOTE — Progress Notes (Signed)
ANTIBIOTIC CONSULT NOTE - INITIAL  Pharmacy Consult for Vancomycin Indication: Hidradenitis Suppurativa   Allergies  Allergen Reactions  . Amoxicillin-Pot Clavulanate Diarrhea  . Rosiglitazone Maleate     REACTION: swelling  . Amoxicillin Rash    Patient Measurements: Height: 5\' 6"  (167.6 cm) Weight: 357 lb 2.3 oz (162 kg) IBW/kg (Calculated) : 59.3  Adjusted Body Weight: 100 kg  Vital Signs: Temp: 97.6 F (36.4 C) (09/21 0020) Temp src: Oral (09/21 0020) BP: 112/52 mmHg (09/21 0020) Pulse Rate: 143  (09/21 0020) Intake/Output from previous day: 09/20 0701 - 09/21 0700 In: 12.5 [Blood:12.5] Out: -  Intake/Output from this shift: Total I/O In: 12.5 [Blood:12.5] Out: -   Labs:  Basename 08/16/12 1830  WBC 15.2*  HGB 6.8*  PLT 315  LABCREA --  CREATININE 6.01*   Estimated Creatinine Clearance: 17.7 ml/min (by C-G formula based on Cr of 6.01). No results found for this basename: VANCOTROUGH:2,VANCOPEAK:2,VANCORANDOM:2,GENTTROUGH:2,GENTPEAK:2,GENTRANDOM:2,TOBRATROUGH:2,TOBRAPEAK:2,TOBRARND:2,AMIKACINPEAK:2,AMIKACINTROU:2,AMIKACIN:2, in the last 72 hours   Microbiology: No results found for this or any previous visit (from the past 720 hour(s)).  Medical History: Past Medical History  Diagnosis Date  . Systolic congestive heart failure   . SOB (shortness of breath)   . HTN (hypertension)   . DM type 2 (diabetes mellitus, type 2)   . Chronic kidney disease (CKD), stage III (moderate)   . Iron deficiency anemia   . Morbid obesity   . Hyperkalemia   . Hypothyroidism   . CHF (congestive heart failure)     Medications:  Prescriptions prior to admission  Medication Sig Dispense Refill  . aspirin EC 81 MG EC tablet Take 1 tablet (81 mg total) by mouth daily.  30 tablet  3  . carvedilol (COREG) 6.25 MG tablet Take 6.25 mg by mouth 2 (two) times daily with a meal.      . cyclobenzaprine (FLEXERIL) 10 MG tablet Take 10 mg by mouth 3 (three) times daily as needed.  For Spasms      . DULoxetine (CYMBALTA) 60 MG capsule Take 60 mg by mouth daily.        . ferrous sulfate 325 (65 FE) MG tablet Take 325 mg by mouth 2 (two) times daily.        . folic acid (FOLVITE) 1 MG tablet Take 1 mg by mouth daily.      . furosemide (LASIX) 80 MG tablet Take 1 tablet (80 mg total) by mouth 2 (two) times daily.  60 tablet  0  . hydrALAZINE (APRESOLINE) 25 MG tablet Take 37.5 mg by mouth 3 (three) times daily.      . insulin aspart (NOVOLOG) 100 UNIT/ML injection For CBG 70-120, no Insulin; CBG 121-150, 1 unit; CBG 151-200, 2 Units; CBG 201-250, 3 Units; CBG 351-300, 5 Units; CBG 301-350, 7 Units, CBG 351-400, 9 Units.  1 vial  0  . isosorbide mononitrate (IMDUR) 30 MG 24 hr tablet Take 30 mg by mouth daily.      Marland Kitchen levothyroxine (SYNTHROID, LEVOTHROID) 25 MCG tablet Take 25 mcg by mouth daily.      . magnesium oxide (MAG-OX) 400 MG tablet Take 400 mg by mouth 2 (two) times daily.       Marland Kitchen oxyCODONE-acetaminophen (PERCOCET) 5-325 MG per tablet Take 1 tablet by mouth every 6 (six) hours as needed. For pain      . potassium chloride SA (K-DUR,KLOR-CON) 20 MEQ tablet Take 20 mEq by mouth 2 (two) times daily.      . pravastatin (PRAVACHOL) 40  MG tablet Take 40 mg by mouth daily.        . sevelamer (RENVELA) 800 MG tablet Take 3 tablets (2,400 mg total) by mouth 3 (three) times daily with meals.  180 tablet  0  . sodium bicarbonate 650 MG tablet Take 2 tablets (1,300 mg total) by mouth 2 (two) times daily.  120 tablet  0  . zinc sulfate 220 MG capsule Take 220 mg by mouth daily.       Assessment: 51 yo female with infected groin lymph nodes for empiric antibiotics.  Reviewed vancomycin dosing from prior admissions in February/March and June/July.  Renal insufficiency has steadily progressed (SCr 1.7 3/13, 4.0 7/13, now 6.0)   Goal of Therapy:  Vancomycin trough 10-15  Plan:  Vancomycin 2.5 g IV now.   F/U random vancomycin level in 48hrs to guide therapy.  [name] 08/17/2012,12:33 AM

## 2012-08-18 DIAGNOSIS — L732 Hidradenitis suppurativa: Secondary | ICD-10-CM

## 2012-08-18 DIAGNOSIS — K219 Gastro-esophageal reflux disease without esophagitis: Secondary | ICD-10-CM

## 2012-08-18 LAB — CBC
HCT: 26.8 % — ABNORMAL LOW (ref 36.0–46.0)
Hemoglobin: 8.2 g/dL — ABNORMAL LOW (ref 12.0–15.0)
MCH: 27.2 pg (ref 26.0–34.0)
MCHC: 30.6 g/dL (ref 30.0–36.0)
RDW: 17 % — ABNORMAL HIGH (ref 11.5–15.5)

## 2012-08-18 LAB — TYPE AND SCREEN
ABO/RH(D): B POS
Antibody Screen: NEGATIVE
Unit division: 0
Unit division: 0

## 2012-08-18 LAB — GLUCOSE, CAPILLARY
Glucose-Capillary: 114 mg/dL — ABNORMAL HIGH (ref 70–99)
Glucose-Capillary: 110 mg/dL — ABNORMAL HIGH (ref 70–99)
Glucose-Capillary: 112 mg/dL — ABNORMAL HIGH (ref 70–99)

## 2012-08-18 LAB — BASIC METABOLIC PANEL
BUN: 81 mg/dL — ABNORMAL HIGH (ref 6–23)
Calcium: 8 mg/dL — ABNORMAL LOW (ref 8.4–10.5)
Creatinine, Ser: 6.2 mg/dL — ABNORMAL HIGH (ref 0.50–1.10)
GFR calc non Af Amer: 7 mL/min — ABNORMAL LOW (ref >90)
Glucose, Bld: 93 mg/dL (ref 70–99)

## 2012-08-18 MED ORDER — LEVOTHYROXINE SODIUM 50 MCG PO TABS
50.0000 ug | ORAL_TABLET | Freq: Every day | ORAL | Status: DC
  Administered 2012-08-19 – 2012-08-23 (×5): 50 ug via ORAL
  Filled 2012-08-18 (×7): qty 1

## 2012-08-18 MED ORDER — CLINDAMYCIN PHOSPHATE 300 MG/50ML IV SOLN
300.0000 mg | Freq: Three times a day (TID) | INTRAVENOUS | Status: DC
  Filled 2012-08-18 (×3): qty 50

## 2012-08-18 MED ORDER — CLINDAMYCIN PHOSPHATE 900 MG/50ML IV SOLN
900.0000 mg | Freq: Three times a day (TID) | INTRAVENOUS | Status: DC
  Administered 2012-08-18 – 2012-08-22 (×12): 900 mg via INTRAVENOUS
  Filled 2012-08-18 (×14): qty 50

## 2012-08-18 MED ORDER — DEXTROSE 5 % IV SOLN
2.0000 g | INTRAVENOUS | Status: DC
  Administered 2012-08-18 – 2012-08-19 (×2): 2 g via INTRAVENOUS
  Filled 2012-08-18 (×3): qty 2

## 2012-08-18 NOTE — Progress Notes (Signed)
ANTIBIOTIC CONSULT NOTE - FOLLOW UP  Pharmacy Consult for van/cefepime, new clinda added today Indication: hidradenitis, abscess  Allergies  Allergen Reactions  . Amoxicillin-Pot Clavulanate Diarrhea  . Rosiglitazone Maleate     REACTION: swelling  . Amoxicillin Rash    Patient Measurements: Height: 5\' 6"  (167.6 cm) Weight: 358 lb 3.2 oz (162.478 kg) IBW/kg (Calculated) : 59.3    Vital Signs: Temp: 97.8 F (36.6 C) (09/22 1340) Temp src: Oral (09/22 1340) BP: 102/58 mmHg (09/22 1340) Pulse Rate: 84  (09/22 0835) Intake/Output from previous day: 09/21 0701 - 09/22 0700 In: 100 [IV Piggyback:100] Out: 350 [Urine:350] Intake/Output from this shift:    Labs:  Basename 08/18/12 0522 08/17/12 0815 08/16/12 1830  WBC 15.9* 15.9* 15.2*  HGB 8.2* 7.7* 6.8*  PLT 339 288 315  LABCREA -- -- --  CREATININE 6.20* 6.20* 6.01*   Estimated Creatinine Clearance: 17.2 ml/min (by C-G formula based on Cr of 6.2). No results found for this basename: VANCOTROUGH:2,VANCOPEAK:2,VANCORANDOM:2,GENTTROUGH:2,GENTPEAK:2,GENTRANDOM:2,TOBRATROUGH:2,TOBRAPEAK:2,TOBRARND:2,AMIKACINPEAK:2,AMIKACINTROU:2,AMIKACIN:2, in the last 72 hours     Assessment: [name] is a 51 yo lady with CKD with hidradenitis and abscess who has refused I&D.  She was given vancomycin 2500 mg IV loading dose on 9/21 and a 48 hr random level will be checked 9/23 am.  She was give clinda 900 mg IV x1 on 9/20.  She was started on cefepime 1 gm q24 on 9/21.  Plan:  1. Increase clinda from 300 mg IV q8h to 900 mg IV q8h 2nd pt wt of 162 kg and no dosage adjustment needed for renal dysfunction 2. Check vanc random in am and redose as needed. 3. Increase maxipime to 2 gm IV q24 2nd to pt wt [name], Pharm.D. QP:[id number] 08/18/2012 4:08 PM

## 2012-08-18 NOTE — Progress Notes (Addendum)
Subjective: Alert. No distress. Mental status normal. No fever or chills.  I had a lengthy discussion with the patient. She continues to decline any surgery since she is stable and says she cannot pay for this. There has been no increase in pain. WBC 15,900. Remains on antibiotics.  She states that there is no need for her to be followed by surgery since she is not going to allow any surgery. I told her that we will sign off her case at this point in time. I told her we would be happy to return to see her if she changed her mind about surgical intervention for her soft tissue infection.  Objective: Vital signs in last 24 hours: Temp:  [97.2 F (36.2 C)-98.3 F (36.8 C)] 97.3 F (36.3 C) (09/22 0500) Pulse Rate:  [67-73] 71  (09/22 0500) Resp:  [20-24] 20  (09/22 0500) BP: (81-113)/(49-67) 109/50 mmHg (09/22 0500) SpO2:  [94 %-95 %] 94 % (09/22 0500) Weight:  [358 lb 3.2 oz (162.478 kg)] 358 lb 3.2 oz (162.478 kg) (09/21 2107) Last BM Date: 08/16/12  Intake/Output from previous day: 09/21 0701 - 09/22 0700 In: 100 [IV Piggyback:100] Out: 350 [Urine:350] Intake/Output this shift:     Exam: the patient declined any physical exam other than  conversation. She was alert, no distress, mental status normal, depressed affect. Morbidly obese.  Lab Results:   Basename 08/18/12 0522 08/17/12 0815  WBC 15.9* 15.9*  HGB 8.2* 7.7*  HCT 26.8* 24.8*  PLT 339 288   BMET  Basename 08/18/12 0522 08/17/12 0815  NA 132* 129*  K 5.3* 5.3*  CL 99 98  CO2 18* 17*  GLUCOSE 93 126*  BUN 81* 79*  CREATININE 6.20* 6.20*  CALCIUM 8.0* 7.7*   PT/INR  Basename 08/16/12 2047  LABPROT 16.8*  INR 1.40   ABG No results found for this basename: PHART:2,PCO2:2,PO2:2,HCO3:2 in the last 72 hours  Studies/Results: Dg Chest Portable 1 View  08/16/2012  *RADIOLOGY REPORT*  Clinical Data: abnormal labs.  Weakness.  PORTABLE CHEST - 1 VIEW  Comparison: 05/23/2012  Findings: There is moderate  cardiac enlargement.  There is no pleural effusion or edema identified.  No airspace consolidation identified.  IMPRESSION:  1.  Cardiac enlargement. 2.  No heart failure.   Original Report Authenticated By: Angelita Ingles, M.D.     Anti-infectives: Anti-infectives     Start     Dose/Rate Route Frequency Ordered Stop   08/17/12 0100   ceFEPIme (MAXIPIME) 1 g in dextrose 5 % 50 mL IVPB        1 g 100 mL/hr over 30 Minutes Intravenous Every 24 hours 08/17/12 0030     08/17/12 0100   vancomycin (VANCOCIN) 2,500 mg in sodium chloride 0.9 % 500 mL IVPB        2,500 mg 250 mL/hr over 120 Minutes Intravenous  Once 08/17/12 0059 08/17/12 0841   08/17/12 0020   ceFEPIme (MAXIPIME) 1 g in dextrose 5 % 50 mL IVPB  Status:  Discontinued        1 g 100 mL/hr over 30 Minutes Intravenous Every 12 hours 08/17/12 0020 08/17/12 0029   08/16/12 2300   vancomycin (VANCOCIN) IVPB 1000 mg/200 mL premix  Status:  Discontinued        1,000 mg 200 mL/hr over 60 Minutes Intravenous  Once 08/16/12 2250 08/17/12 0032   08/16/12 2100   clindamycin (CLEOCIN) IVPB 900 mg        900 mg 100  mL/hr over 30 Minutes Intravenous  Once 08/16/12 2048 08/16/12 2219          Assessment/Plan:  Chronic, 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 She would benefit from surgical debridement, but declines this.  Therefore, at this point in time we will sign off. Please reconsult surgery if the patient agrees to surgical intervention.The only other option at this point is suppressive antibiotics.  Morbid obesity Systolic congestive heart failure Diabetes mellitus type 2 Chronic kidney disease, stage III Pregnancy anemia      LOS: 2 days    Bernice Mullin M. Dalbert Batman, M.D., [facility] General and Minimally invasive Surgery Breast and Colorectal Surgery Office:   [phone] Pager:   [phone]  08/18/2012

## 2012-08-18 NOTE — Consult Note (Signed)
INFECTIOUS DISEASE CONSULT NOTE  Date of Admission:  08/16/2012  Date of Consult:  08/18/2012  Reason for Consult:  Hidradenitis. Abscess Referring Physician: Dr Jerilee Hoh  Impression/Recommendation Abscess Hidradenitis Suppurativa CKD (stage III) Pyuria  Would- Continue anbx Add clinda Topical clinda when over acute episode Try to have her meet with CM, hospital financial advisor about cost of having surgery done.  Check BCx, UCx  Comment- I had length discussion with pt that was somewhat circular involving her BP-wounds-lasix. I am not able to convince her to have surgery for her wounds because I do not know what it will cost her to have surgery. It would be helpful to give her anaerobic coverage, adding clinda will do this.   Thank you so much for this interesting consult,   Bobby Rumpf [id number]  [name] is an 51 y.o. female.  HPI: 51 yo F with morbid obesity, DM2 (x 44yrs), and previous eval at  [facility] for Hidradenitis, comes to hospital 9-21 with Hgb of 6. She was asx from this. She was also noted to have increased foul smelling d/c from her wounds.  No f/c. She was started on vanco/cefepime. In hospital she has had hypotension and sepsis presumed from her wounds. She was eval by surgery and she refused surgery. She currently complains of SOB, dyspnea and LE edema. She is concerned about being fluid overloaded.   Past Medical History  Diagnosis Date  . Systolic congestive heart failure   . SOB (shortness of breath)   . HTN (hypertension)   . DM type 2 (diabetes mellitus, type 2)   . Chronic kidney disease (CKD), stage III (moderate)   . Iron deficiency anemia   . Morbid obesity   . Hyperkalemia   . Hypothyroidism   . CHF (congestive heart failure)     Past Surgical History  Procedure Date  . Portacath placement   . Cystoscopy w/ ureteral stent placement 05/28/2012    Procedure: CYSTOSCOPY WITH RETROGRADE PYELOGRAM/URETERAL STENT PLACEMENT;  Surgeon: Reece Packer, MD;  Location: [facility];  Service: Urology;  Laterality: Left;     Allergies  Allergen Reactions  . Amoxicillin-Pot Clavulanate Diarrhea  . Rosiglitazone Maleate     REACTION: swelling  . Amoxicillin Rash    Medications:  Scheduled:   . aspirin EC  81 mg Oral Daily  . ceFEPime (MAXIPIME) IV  1 g Intravenous Q24H  . DULoxetine  60 mg Oral Daily  . ferrous sulfate  325 mg Oral BID AC  . folic acid  1 mg Oral Daily  . heparin  5,000 Units Subcutaneous Q8H  .  HYDROmorphone (DILAUDID) injection  1 mg Intravenous Once  . insulin aspart  0-20 Units Subcutaneous TID WC  . insulin aspart  0-5 Units Subcutaneous QHS  . insulin aspart  6 Units Subcutaneous TID WC  . levothyroxine  50 mcg Oral QAC breakfast  . magnesium oxide  400 mg Oral BID  . sevelamer  2,400 mg Oral TID WC  . simvastatin  20 mg Oral q1800  . sodium bicarbonate  1,300 mg Oral BID  . sodium chloride  3 mL Intravenous Q12H  . zinc sulfate  220 mg Oral Daily  . DISCONTD: levothyroxine  25 mcg Oral QAC breakfast    Total days of antibiotics 2 (vanco/cefepime)   Social History:  reports that she has never smoked. She has never used smokeless tobacco. She reports that she does not drink alcohol or use illicit drugs.  Family History  Problem  Relation Age of Onset  . Coronary artery disease Mother   . Hypertension Mother   . Diabetes type II Mother   . Malignant hyperthermia Mother   . Coronary artery disease Father   . Hypertension Father   . Malignant hyperthermia Father   . Cancer Maternal Grandfather     ? Type    General ROS: normal BM previous, normal urination previously ( no blood or d/c, cloudy since adm). Felt cold at home but no fevers. Denies numbness or paresthesias. See HPI  Blood pressure 102/58, pulse 84, temperature 97.8 F (36.6 C), temperature source Oral, resp. rate 20, height 5\' 6"  (1.676 m), weight 162.478 kg (358 lb 3.2 oz), SpO2 96.00%. General appearance: alert, cooperative  and no distress Eyes: negative findings: conjunctivae and sclerae normal and pupils equal, round, reactive to light and accomodation Throat: normal findings: oropharynx pink & moist without lesions or evidence of thrush Neck: no adenopathy Lungs: clear to auscultation bilaterally Heart: regular rate and rhythm Abdomen: normal findings: bowel sounds normal and soft, non-tender Extremities: edema significant Skin: multiple skin wounds- gluteal area, groin, intertriginous areas, L breast,    Results for orders placed during the hospital encounter of 08/16/12 (from the past 48 hour(s))  CBC WITH DIFFERENTIAL     Status: Abnormal   Collection Time   08/16/12  6:30 PM      Component Value Range Comment   WBC 15.2 (*) 4.0 - 10.5 K/uL    RBC 2.56 (*) 3.87 - 5.11 MIL/uL    Hemoglobin 6.8 (*) 12.0 - 15.0 g/dL    HCT 22.8 (*) 36.0 - 46.0 %    MCV 89.1  78.0 - 100.0 fL    MCH 26.6  26.0 - 34.0 pg    MCHC 29.8 (*) 30.0 - 36.0 g/dL    RDW 16.8 (*) 11.5 - 15.5 %    Platelets 315  150 - 400 K/uL    Neutrophils Relative 83 (*) 43 - 77 %    Neutro Abs 12.6 (*) 1.7 - 7.7 K/uL    Lymphocytes Relative 7 (*) 12 - 46 %    Lymphs Abs 1.0  0.7 - 4.0 K/uL    Monocytes Relative 6  3 - 12 %    Monocytes Absolute 0.9  0.1 - 1.0 K/uL    Eosinophils Relative 5  0 - 5 %    Eosinophils Absolute 0.7  0.0 - 0.7 K/uL    Basophils Relative 0  0 - 1 %    Basophils Absolute 0.0  0.0 - 0.1 K/uL   BASIC METABOLIC PANEL     Status: Abnormal   Collection Time   08/16/12  6:30 PM      Component Value Range Comment   Sodium 132 (*) 135 - 145 mEq/L    Potassium 5.1  3.5 - 5.1 mEq/L    Chloride 98  96 - 112 mEq/L    CO2 20  19 - 32 mEq/L    Glucose, Bld 165 (*) 70 - 99 mg/dL    BUN 75 (*) 6 - 23 mg/dL    Creatinine, Ser 6.01 (*) 0.50 - 1.10 mg/dL    Calcium 8.2 (*) 8.4 - 10.5 mg/dL    GFR calc non Af Amer 7 (*) >90 mL/min    GFR calc Af Amer 9 (*) >90 mL/min   TYPE AND SCREEN     Status: Normal (Preliminary result)     Collection Time   08/16/12  8:44 PM  Component Value Range Comment   ABO/RH(D) B POS      Antibody Screen NEG      Sample Expiration 08/19/2012      Unit Number [id number]      Blood Component Type RED CELLS,LR      Unit division 00      Status of Unit ISSUED,FINAL      Transfusion Status OK TO TRANSFUSE      Crossmatch Result Compatible      Unit Number [id number]      Blood Component Type RED CELLS,LR      Unit division 00      Status of Unit ISSUED      Transfusion Status OK TO TRANSFUSE      Crossmatch Result Compatible     PREPARE RBC (CROSSMATCH)     Status: Normal   Collection Time   08/16/12  8:44 PM      Component Value Range Comment   Order Confirmation ORDER PROCESSED BY BLOOD BANK     PROTIME-INR     Status: Abnormal   Collection Time   08/16/12  8:47 PM      Component Value Range Comment   Prothrombin Time 16.8 (*) 11.6 - 15.2 seconds    INR 1.40  0.00 - 1.49   PRO B NATRIURETIC PEPTIDE     Status: Abnormal   Collection Time   08/16/12  8:49 PM      Component Value Range Comment   Pro B Natriuretic peptide (BNP) 26806.0 (*) 0 - 125 pg/mL   URINALYSIS, ROUTINE W REFLEX MICROSCOPIC     Status: Abnormal   Collection Time   08/16/12  9:27 PM      Component Value Range Comment   Color, Urine YELLOW  YELLOW    APPearance TURBID (*) CLEAR    Specific Gravity, Urine 1.019  1.005 - 1.030    pH 5.5  5.0 - 8.0    Glucose, UA NEGATIVE  NEGATIVE mg/dL    Hgb urine dipstick MODERATE (*) NEGATIVE    Bilirubin Urine SMALL (*) NEGATIVE    Ketones, ur NEGATIVE  NEGATIVE mg/dL    Protein, ur >300 (*) NEGATIVE mg/dL    Urobilinogen, UA 0.2  0.0 - 1.0 mg/dL    Nitrite NEGATIVE  NEGATIVE    Leukocytes, UA LARGE (*) NEGATIVE   URINE MICROSCOPIC-ADD ON     Status: Abnormal   Collection Time   08/16/12  9:27 PM      Component Value Range Comment   Squamous Epithelial / LPF RARE  RARE    WBC, UA TOO NUMEROUS TO COUNT  <3 WBC/hpf    RBC / HPF 7-10  <3 RBC/hpf     Bacteria, UA MANY (*) RARE   GLUCOSE, CAPILLARY     Status: Abnormal   Collection Time   08/17/12 12:33 AM      Component Value Range Comment   Glucose-Capillary 152 (*) 70 - 99 mg/dL    Comment 1 Documented in Chart      Comment 2 Notify RN     GLUCOSE, CAPILLARY     Status: Abnormal   Collection Time   08/17/12  7:40 AM      Component Value Range Comment   Glucose-Capillary 133 (*) 70 - 99 mg/dL   BASIC METABOLIC PANEL     Status: Abnormal   Collection Time   08/17/12  8:15 AM      Component Value Range Comment   Sodium 129 (*)  135 - 145 mEq/L    Potassium 5.3 (*) 3.5 - 5.1 mEq/L    Chloride 98  96 - 112 mEq/L    CO2 17 (*) 19 - 32 mEq/L    Glucose, Bld 126 (*) 70 - 99 mg/dL    BUN 79 (*) 6 - 23 mg/dL    Creatinine, Ser 6.20 (*) 0.50 - 1.10 mg/dL    Calcium 7.7 (*) 8.4 - 10.5 mg/dL    GFR calc non Af Amer 7 (*) >90 mL/min    GFR calc Af Amer 8 (*) >90 mL/min   CBC     Status: Abnormal   Collection Time   08/17/12  8:15 AM      Component Value Range Comment   WBC 15.9 (*) 4.0 - 10.5 K/uL    RBC 2.81 (*) 3.87 - 5.11 MIL/uL    Hemoglobin 7.7 (*) 12.0 - 15.0 g/dL    HCT 24.8 (*) 36.0 - 46.0 %    MCV 88.3  78.0 - 100.0 fL    MCH 27.4  26.0 - 34.0 pg    MCHC 31.0  30.0 - 36.0 g/dL    RDW 16.8 (*) 11.5 - 15.5 %    Platelets 288  150 - 400 K/uL   TSH     Status: Abnormal   Collection Time   08/17/12  8:15 AM      Component Value Range Comment   TSH 5.883 (*) 0.350 - 4.500 uIU/mL   GLUCOSE, CAPILLARY     Status: Abnormal   Collection Time   08/17/12 10:07 AM      Component Value Range Comment   Glucose-Capillary 114 (*) 70 - 99 mg/dL   GLUCOSE, CAPILLARY     Status: Abnormal   Collection Time   08/17/12 11:29 AM      Component Value Range Comment   Glucose-Capillary 128 (*) 70 - 99 mg/dL   GLUCOSE, CAPILLARY     Status: Abnormal   Collection Time   08/17/12  4:17 PM      Component Value Range Comment   Glucose-Capillary 134 (*) 70 - 99 mg/dL   GLUCOSE, CAPILLARY      Status: Abnormal   Collection Time   08/17/12  8:58 PM      Component Value Range Comment   Glucose-Capillary 136 (*) 70 - 99 mg/dL    Comment 1 Documented in Chart      Comment 2 Notify RN     CBC     Status: Abnormal   Collection Time   08/18/12  5:22 AM      Component Value Range Comment   WBC 15.9 (*) 4.0 - 10.5 K/uL    RBC 3.01 (*) 3.87 - 5.11 MIL/uL    Hemoglobin 8.2 (*) 12.0 - 15.0 g/dL    HCT 26.8 (*) 36.0 - 46.0 %    MCV 89.0  78.0 - 100.0 fL    MCH 27.2  26.0 - 34.0 pg    MCHC 30.6  30.0 - 36.0 g/dL    RDW 17.0 (*) 11.5 - 15.5 %    Platelets 339  150 - 400 K/uL   BASIC METABOLIC PANEL     Status: Abnormal   Collection Time   08/18/12  5:22 AM      Component Value Range Comment   Sodium 132 (*) 135 - 145 mEq/L    Potassium 5.3 (*) 3.5 - 5.1 mEq/L    Chloride 99  96 - 112 mEq/L  CO2 18 (*) 19 - 32 mEq/L    Glucose, Bld 93  70 - 99 mg/dL    BUN 81 (*) 6 - 23 mg/dL    Creatinine, Ser 6.20 (*) 0.50 - 1.10 mg/dL    Calcium 8.0 (*) 8.4 - 10.5 mg/dL    GFR calc non Af Amer 7 (*) >90 mL/min    GFR calc Af Amer 8 (*) >90 mL/min   GLUCOSE, CAPILLARY     Status: Abnormal   Collection Time   08/18/12  7:50 AM      Component Value Range Comment   Glucose-Capillary 102 (*) 70 - 99 mg/dL    Comment 1 Notify RN      Comment 2 Documented in Chart     GLUCOSE, CAPILLARY     Status: Abnormal   Collection Time   08/18/12  2:06 PM      Component Value Range Comment   Glucose-Capillary 114 (*) 70 - 99 mg/dL    Comment 1 Notify RN      Comment 2 Documented in Chart         Component Value Date/Time   SDES URINE, CATHETERIZED 05/22/2012 1652   SPECREQUEST NONE 05/22/2012 1652   CULT NO GROWTH 05/22/2012 1652   REPTSTATUS 05/23/2012 FINAL 05/22/2012 1652   Dg Chest Portable 1 View  08/16/2012  *RADIOLOGY REPORT*  Clinical Data: abnormal labs.  Weakness.  PORTABLE CHEST - 1 VIEW  Comparison: 05/23/2012  Findings: There is moderate cardiac enlargement.  There is no pleural effusion or  edema identified.  No airspace consolidation identified.  IMPRESSION:  1.  Cardiac enlargement. 2.  No heart failure.   Original Report Authenticated By: Angelita Ingles, M.D.    No results found for this or any previous visit (from the past 240 hour(s)).    08/18/2012, 2:57 PM     LOS: 2 days

## 2012-08-18 NOTE — Progress Notes (Signed)
[facility]             Progress Note   Subjective: Lying in bed, obese, has no significant complaints. Refused I and D that was offered by surgery.  Objective: Vital signs in last 24 hours: Temp:  [97.3 F (36.3 C)-98.3 F (36.8 C)] 97.5 F (36.4 C) (09/22 0835) Pulse Rate:  [67-84] 84  (09/22 0835) Resp:  [20-24] 20  (09/22 0835) BP: (100-113)/(50-67) 112/55 mmHg (09/22 0835) SpO2:  [94 %-95 %] 95 % (09/22 0835) Weight:  [162.478 kg (358 lb 3.2 oz)] 162.478 kg (358 lb 3.2 oz) (09/21 2107) Weight change: 0.479 kg (1 lb 0.9 oz) Last BM Date: 08/16/12  Intake/Output from previous day: 09/21 0701 - 09/22 0700 In: 100 [IV Piggyback:100] Out: 350 [Urine:350]     Physical Exam: General: Alert, awake, oriented x3, morbidly obese. HEENT: No bruits, no goiter. Heart: Regular rate and rhythm, without murmurs, rubs, gallops. Lungs: Clear to auscultation bilaterally. Abdomen: Obese,Soft, nontender, nondistended, positive bowel sounds. Extremities: No clubbing cyanosis or edema with positive pedal pulses. Neuro: Grossly intact, nonfocal. She has not been ambulated. Skin: Has multiple areas of active hydradenitis under her breasts, pannus and worse in her groin area. They are all purulent and foul-smelling.     Lab Results: Basic Metabolic Panel:  Basename 08/18/12 0522 08/17/12 0815  NA 132* 129*  K 5.3* 5.3*  CL 99 98  CO2 18* 17*  GLUCOSE 93 126*  BUN 81* 79*  CREATININE 6.20* 6.20*  CALCIUM 8.0* 7.7*  MG -- --  PHOS -- --   CBC:  Basename 08/18/12 0522 08/17/12 0815 08/16/12 1830  WBC 15.9* 15.9* --  NEUTROABS -- -- 12.6*  HGB 8.2* 7.7* --  HCT 26.8* 24.8* --  MCV 89.0 88.3 --  PLT 339 288 --   BNP:  Basename 08/16/12 2049  PROBNP 26806.0*   CBG:  Basename 08/18/12 0750 08/17/12 2058 08/17/12 1617 08/17/12 1129 08/17/12 1007 08/17/12 0740  GLUCAP 102* 136* 134* 128* 114* 133*   Coagulation:  Basename 08/16/12 2047  LABPROT 16.8*    INR 1.40   Urinalysis:  Basename 08/16/12 2127  COLORURINE YELLOW  LABSPEC 1.019  PHURINE 5.5  GLUCOSEU NEGATIVE  HGBUR MODERATE*  BILIRUBINUR SMALL*  KETONESUR NEGATIVE  PROTEINUR >300*  UROBILINOGEN 0.2  NITRITE NEGATIVE  LEUKOCYTESUR LARGE*    Studies/Results: Dg Chest Portable 1 View  08/16/2012  *RADIOLOGY REPORT*  Clinical Data: abnormal labs.  Weakness.  PORTABLE CHEST - 1 VIEW  Comparison: 05/23/2012  Findings: There is moderate cardiac enlargement.  There is no pleural effusion or edema identified.  No airspace consolidation identified.  IMPRESSION:  1.  Cardiac enlargement. 2.  No heart failure.   Original Report Authenticated By: Angelita Ingles, M.D.     Medications: Scheduled Meds:    . aspirin EC  81 mg Oral Daily  . ceFEPime (MAXIPIME) IV  1 g Intravenous Q24H  . DULoxetine  60 mg Oral Daily  . ferrous sulfate  325 mg Oral BID AC  . folic acid  1 mg Oral Daily  . heparin  5,000 Units Subcutaneous Q8H  .  HYDROmorphone (DILAUDID) injection  1 mg Intravenous Once  . insulin aspart  0-20 Units Subcutaneous TID WC  . insulin aspart  0-5 Units Subcutaneous QHS  . insulin aspart  6 Units Subcutaneous TID WC  . levothyroxine  25 mcg Oral QAC breakfast  . magnesium oxide  400 mg Oral BID  . sevelamer  2,400  mg Oral TID WC  . simvastatin  20 mg Oral q1800  . sodium bicarbonate  1,300 mg Oral BID  . sodium chloride  500 mL Intravenous Once  . sodium chloride  3 mL Intravenous Q12H  . zinc sulfate  220 mg Oral Daily  . DISCONTD: carvedilol  6.25 mg Oral BID WC  . DISCONTD: furosemide  80 mg Oral BID  . DISCONTD: hydrALAZINE  37.5 mg Oral TID  . DISCONTD: isosorbide mononitrate  30 mg Oral Daily   Continuous Infusions:  PRN Meds:.sodium chloride, cyclobenzaprine, HYDROmorphone (DILAUDID) injection, oxyCODONE-acetaminophen, sodium chloride  Assessment/Plan:  Principal Problem:  *Hidradenitis suppurativa Active Problems:  Morbid obesity  Chronic kidney  disease (CKD), stage III (moderate)  Anemia  ARF (acute renal failure)  Sepsis associated hypotension  Sepsis with hypotension -BP improved today with fluid resuscitation and cessation of BP meds. -Will continue to hold all BP meds including: coreg, lasix, imdur, hydralazine. -Judicious morphine use. -I believe 2/2 severe hydradenitis suppurativa.   Hydradenitis Suppurative -Has been seen this admission by 2 surgeons (Drs. Azzie Almas). She has refused I and D twice. -Have asked ID to see for recommendation on chronic suppressive therapy. -For now keep on vanc/cefepime pending ID recs.  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 -Follows with Dr. Florene Glen outpatient. -Renal function has been stable at around 6..? New baseline. -Needs to follow up with Dr. Florene Glen upon DC. -No acute indications for dialysis.  Hypothyroidism -TSH high. -Increase synthroid dose from 25 to 50 mcg.  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 -Has received 2 u PRBCs. -Recheck Hb in am to see if further transfusion required, Hb only increased to 7.7 from 6.8 after 2 units. -No signs of active bleeding. -Hb 8.0 today.  Disposition -Pending ID recommendations.  Time spent coordinating care: 35 minutes.   LOS: 2 days   [facility] Pager: [phone] 08/18/2012, 10:43 AM

## 2012-08-19 DIAGNOSIS — A419 Sepsis, unspecified organism: Secondary | ICD-10-CM

## 2012-08-19 DIAGNOSIS — E669 Obesity, unspecified: Secondary | ICD-10-CM

## 2012-08-19 DIAGNOSIS — R6521 Severe sepsis with septic shock: Secondary | ICD-10-CM

## 2012-08-19 LAB — GLUCOSE, CAPILLARY
Glucose-Capillary: 145 mg/dL — ABNORMAL HIGH (ref 70–99)
Glucose-Capillary: 147 mg/dL — ABNORMAL HIGH (ref 70–99)

## 2012-08-19 LAB — CBC
MCV: 88.4 fL (ref 78.0–100.0)
Platelets: 332 10*3/uL (ref 150–400)
RBC: 3.1 MIL/uL — ABNORMAL LOW (ref 3.87–5.11)
WBC: 15.6 10*3/uL — ABNORMAL HIGH (ref 4.0–10.5)

## 2012-08-19 LAB — BASIC METABOLIC PANEL
CO2: 21 mEq/L (ref 19–32)
Chloride: 100 mEq/L (ref 96–112)
Creatinine, Ser: 5.79 mg/dL — ABNORMAL HIGH (ref 0.50–1.10)

## 2012-08-19 LAB — HIV ANTIBODY (ROUTINE TESTING W REFLEX): HIV: NONREACTIVE

## 2012-08-19 MED ORDER — VANCOMYCIN HCL IN DEXTROSE 1-5 GM/200ML-% IV SOLN
1000.0000 mg | Freq: Once | INTRAVENOUS | Status: AC
  Administered 2012-08-19: 1000 mg via INTRAVENOUS
  Filled 2012-08-19: qty 200

## 2012-08-19 MED ORDER — FUROSEMIDE 40 MG PO TABS
60.0000 mg | ORAL_TABLET | Freq: Every day | ORAL | Status: DC
  Administered 2012-08-19 – 2012-08-20 (×2): 60 mg via ORAL
  Filled 2012-08-19 (×3): qty 1

## 2012-08-19 NOTE — Progress Notes (Signed)
[facility]             Progress Note   Subjective: No chest pain, afebrile, complaining of mild shortness of breath. After discussing with the patient pros and cons of no having surgery to perform debridement of her current wounds and the fact of her ongoing infection despite current antibiotic regimen and suppressive antibiotics regimen in the past, patient is willing to talk with the surgeons for the third time for possible I&D.  subjective: Vital signs in last 24 hours: Temp:  [97.4 F (36.3 C)-98.4 F (36.9 C)] 98.4 F (36.9 C) (09/23 0930) Pulse Rate:  [70-84] 70  (09/23 0930) Resp:  [20-21] 21  (09/23 0930) BP: (102-120)/(52-61) 118/54 mmHg (09/23 0930) SpO2:  [95 %-98 %] 98 % (09/23 0930) Weight:  [163.7 kg (360 lb 14.3 oz)] 163.7 kg (360 lb 14.3 oz) (09/22 2126) Weight change: 1.222 kg (2 lb 11.1 oz) Last BM Date: 08/16/12  Intake/Output from previous day: 09/22 0701 - 09/23 0700 In: 340 [I.V.:240; IV Piggyback:100] Out: 1450 [Urine:1450] Total I/O In: 120 [P.O.:120] Out: 250 [Urine:250]   Physical Exam: General: Alert, awake, oriented x3, morbidly obese. Able to speak in full sentences. HEENT: No bruits, no goiter. Heart: Regular rate and rhythm, without murmurs, rubs, gallops. Lungs: Mild basilar rhonchi/crackles; otherwise clear to auscultation bilaterally. Abdomen: Obese, Soft, nontender, nondistended, positive bowel sounds. Extremities: No clubbing, cyanosis or edema with positive pedal pulses. Neuro: Grossly intact, nonfocal. She has not been ambulated. Skin: Has multiple areas of active hydradenitis under her breasts, pannus and worse in her groin/bottom area. They are all purulent and foul-smelling.   Lab Results: Basic Metabolic Panel:  Basename 08/19/12 0624 08/18/12 0522  NA 134* 132*  K 5.4* 5.3*  CL 100 99  CO2 21 18*  GLUCOSE 107* 93  BUN 83* 81*  CREATININE 5.79* 6.20*  CALCIUM 8.2* 8.0*  MG -- --  PHOS -- --    CBC:  Basename 08/19/12 0624 08/18/12 0522 08/16/12 1830  WBC 15.6* 15.9* --  NEUTROABS -- -- 12.6*  HGB 8.3* 8.2* --  HCT 27.4* 26.8* --  MCV 88.4 89.0 --  PLT 332 339 --   BNP:  Basename 08/16/12 2049  PROBNP 26806.0*   CBG:  Basename 08/19/12 1204 08/19/12 0732 08/18/12 2116 08/18/12 1627 08/18/12 1406 08/18/12 0750  GLUCAP 145* 98 112* 110* 114* 102*   Coagulation:  Basename 08/16/12 2047  LABPROT 16.8*  INR 1.40   Urinalysis:  Basename 08/16/12 2127  COLORURINE YELLOW  LABSPEC 1.019  PHURINE 5.5  GLUCOSEU NEGATIVE  HGBUR MODERATE*  BILIRUBINUR SMALL*  KETONESUR NEGATIVE  PROTEINUR >300*  UROBILINOGEN 0.2  NITRITE NEGATIVE  LEUKOCYTESUR LARGE*    Studies/Results: No results found.  Medications: Scheduled Meds:    . aspirin EC  81 mg Oral Daily  . ceFEPime (MAXIPIME) IV  2 g Intravenous Q24H  . clindamycin (CLEOCIN) IV  900 mg Intravenous Q8H  . DULoxetine  60 mg Oral Daily  . ferrous sulfate  325 mg Oral BID AC  . folic acid  1 mg Oral Daily  . furosemide  60 mg Oral Daily  . heparin  5,000 Units Subcutaneous Q8H  .  HYDROmorphone (DILAUDID) injection  1 mg Intravenous Once  . insulin aspart  0-20 Units Subcutaneous TID WC  . insulin aspart  0-5 Units Subcutaneous QHS  . insulin aspart  6 Units Subcutaneous TID WC  . levothyroxine  50 mcg Oral QAC breakfast  . magnesium oxide  400 mg Oral BID  . sevelamer  2,400 mg Oral TID WC  . simvastatin  20 mg Oral q1800  . sodium bicarbonate  1,300 mg Oral BID  . sodium chloride  3 mL Intravenous Q12H  . vancomycin  1,000 mg Intravenous Once  . zinc sulfate  220 mg Oral Daily  . DISCONTD: ceFEPime (MAXIPIME) IV  1 g Intravenous Q24H  . DISCONTD: clindamycin (CLEOCIN) IV  300 mg Intravenous Q8H   Continuous Infusions:  PRN Meds:.sodium chloride, cyclobenzaprine, HYDROmorphone (DILAUDID) injection, oxyCODONE-acetaminophen, sodium chloride  Assessment/Plan:  Principal Problem:  *Hidradenitis  suppurativa Active Problems:  Morbid obesity  Chronic kidney disease (CKD), stage III (moderate)  Anemia  ARF (acute renal failure)  Sepsis associated hypotension  Sepsis with hypotension -BP improved and at its best today after fluid resuscitation and cessation of BP meds. -Will continue to hold all BP meds: coreg, imdur, hydralazine. -Judicious morphine use. -I believe 2/2 severe hydradenitis suppurativa and antihypertensive drugs. -Patient with mild bibasilar crackles; will resume PO lasix, but just at 60mg  daily.  Hydradenitis Suppurative -after discussing pros and cons one more time of ongoing infection and needs for surgery; she agreed to be seen again by surgical service and go for I and D; will re-consult surgery and will follow rec's. -Have asked ID to see for recommendation on chronic suppressive therapy. -For now keep on vanc/cefepime and cleocin as per Id rec's.  Acute on CKD Stage IV-V -Baseline Cr around 5. -Follows with Dr. Florene Glen as an outpatient. -Renal function now returning back to baseline; 5.7 today -Needs to follow up with Dr. Florene Glen upon DC. -No acute indications for dialysis at this moment; will monitor  Hypothyroidism -TSH high. -Continue synthroid at 50 mcg; will need TSH and free T 4 recheck in 4-6 weeks.  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 -Has received 2 u PRBCs. -No signs of active bleeding. -Hb 8.3 today. -will monitor  Disposition -Pending surgery recommendations and extension of her wounds after I & D; might require SNF for wound care.  Time spent coordinating care: 35 minutes.   LOS: 3 days   Kareen Hitsman [facility] Pager: [phone] 08/19/2012, 1:01 PM

## 2012-08-19 NOTE — Progress Notes (Signed)
Patient ID: [name], female   DOB: 01/08/1961, 51 y.o.   MRN: [id number]  Patient states that she does not want to make any decision today concerning additional treatment until she has had a chance to speak with both her Husband and mother.  Patient has been informed that we will speak again with her and her family tomorrow, once they have had a chance to talk and come to a decision. I also informed her that we would initiate a social work consult given her concerns about the cost of care.  Labs: WBC remains elevated at 15.6 On Vanc, Clindamycin and Cefepime as per I.D. Currently HD stable and afebrile.    [facility] Pager # [phone]

## 2012-08-19 NOTE — Progress Notes (Signed)
ANTIBIOTIC CONSULT NOTE - FOLLOW UP  Pharmacy Consult for Vancomycin Indication: Suppurative Hidradenitis  Allergies  Allergen Reactions  . Amoxicillin-Pot Clavulanate Diarrhea  . Rosiglitazone Maleate     REACTION: swelling  . Amoxicillin Rash    Patient Measurements: Height: 5\' 6"  (167.6 cm) Weight: 360 lb 14.3 oz (163.7 kg) IBW/kg (Calculated) : 59.3  Adjusted Body Weight: 100  Vital Signs: Temp: 97.8 F (36.6 C) (09/23 0605) Temp src: Oral (09/23 0605) BP: 104/61 mmHg (09/23 0605) Pulse Rate: 70  (09/23 0605) Intake/Output from previous day: 09/22 0701 - 09/23 0700 In: 340 [I.V.:240; IV Piggyback:100] Out: 1450 [Urine:1450] Intake/Output from this shift: Total I/O In: -  Out: 75 [Urine:75]  Labs:  [facility] 08/19/12 0624 08/18/12 0522 08/17/12 0815  WBC 15.6* 15.9* 15.9*  HGB 8.3* 8.2* 7.7*  PLT 332 339 288  LABCREA -- -- --  CREATININE 5.79* 6.20* 6.20*   Estimated Creatinine Clearance: 18.6 ml/min (by C-G formula based on Cr of 5.79).  Basename 08/19/12 0624  VANCOTROUGH --  Corlis Leak --  VANCORANDOM 17.2  GENTTROUGH --  GENTPEAK --  GENTRANDOM --  TOBRATROUGH --  Shelah Lewandowsky --  TOBRARND --  AMIKACINPEAK --  AMIKACINTROU --  AMIKACIN --     Microbiology: Recent Results (from the past 720 hour(s))  CULTURE, BLOOD (ROUTINE X 2)     Status: Normal (Preliminary result)   Collection Time   08/18/12  4:05 PM      Component Value Range Status Comment   Specimen Description BLOOD HAND LEFT   Final    Special Requests BOTTLES DRAWN AEROBIC AND ANAEROBIC 10CC   Final    Culture  Setup Time 08/18/2012 20:13   Final    Culture     Final    Value:        BLOOD CULTURE RECEIVED NO GROWTH TO DATE CULTURE WILL BE HELD FOR 5 DAYS BEFORE ISSUING A FINAL NEGATIVE REPORT   Report Status PENDING   Incomplete   CULTURE, BLOOD (ROUTINE X 2)     Status: Normal (Preliminary result)   Collection Time   08/18/12  4:10 PM      Component Value Range Status Comment   Specimen Description BLOOD HAND RIGHT   Final    Special Requests BOTTLES DRAWN AEROBIC AND ANAEROBIC 10CC   Final    Culture  Setup Time 08/18/2012 20:13   Final    Culture     Final    Value:        BLOOD CULTURE RECEIVED NO GROWTH TO DATE CULTURE WILL BE HELD FOR 5 DAYS BEFORE ISSUING A FINAL NEGATIVE REPORT   Report Status PENDING   Incomplete     Anti-infectives     Start     Dose/Rate Route Frequency Ordered Stop   08/18/12 2200   ceFEPIme (MAXIPIME) 2 g in dextrose 5 % 50 mL IVPB        2 g 100 mL/hr over 30 Minutes Intravenous Every 24 hours 08/18/12 1609     08/18/12 1630   clindamycin (CLEOCIN) IVPB 900 mg        900 mg 100 mL/hr over 30 Minutes Intravenous 3 times per day 08/18/12 1559     08/18/12 1600   clindamycin (CLEOCIN) IVPB 300 mg  Status:  Discontinued        300 mg 100 mL/hr over 30 Minutes Intravenous 3 times per day 08/18/12 1525 08/18/12 1559   08/17/12 0100   ceFEPIme (MAXIPIME) 1 g in dextrose  5 % 50 mL IVPB  Status:  Discontinued        1 g 100 mL/hr over 30 Minutes Intravenous Every 24 hours 08/17/12 0030 08/18/12 1609   08/17/12 0100   vancomycin (VANCOCIN) 2,500 mg in sodium chloride 0.9 % 500 mL IVPB        2,500 mg 250 mL/hr over 120 Minutes Intravenous  Once 08/17/12 0059 08/17/12 0841   08/17/12 0020   ceFEPIme (MAXIPIME) 1 g in dextrose 5 % 50 mL IVPB  Status:  Discontinued        1 g 100 mL/hr over 30 Minutes Intravenous Every 12 hours 08/17/12 0020 08/17/12 0029   08/16/12 2300   vancomycin (VANCOCIN) IVPB 1000 mg/200 mL premix  Status:  Discontinued        1,000 mg 200 mL/hr over 60 Minutes Intravenous  Once 08/16/12 2250 08/17/12 0032   08/16/12 2100   clindamycin (CLEOCIN) IVPB 900 mg        900 mg 100 mL/hr over 30 Minutes Intravenous  Once 08/16/12 2048 08/16/12 2219          Assessment: 51yo female with CKD-3,  with infected sweat glands & lymph nodes, malodorous discharge, refusing surgery.  Current Cr is 5.79, mildly  improved but up from Cr 4 in July.  Random Vancomycin level is 17.2 this AM, with estimated half-life ~60 hours.  Blood cultures are pending.  Goal of Therapy:  Vancomycin trough level 10-15 mcg/ml  Plan:  1.  Vancomycin 1000mg  IV x 1 at 9PM tonight 2.  Random level on 9/25 3.  Watch for further changes in renal function 4.  F/U culture results  Gracy Bruins, PharmD Clinical Pharmacist [facility]

## 2012-08-19 NOTE — Progress Notes (Signed)
INFECTIOUS DISEASE PROGRESS NOTE  ID: [name] is a 51 y.o. female with   Principal Problem:  *Hidradenitis suppurativa Active Problems:  Morbid obesity  Chronic kidney disease (CKD), stage III (moderate)  Anemia  ARF (acute renal failure)  Sepsis associated hypotension  Subjective: Without complaints. No loose BM  Abtx:  Anti-infectives     Start     Dose/Rate Route Frequency Ordered Stop   08/19/12 2100   vancomycin (VANCOCIN) IVPB 1000 mg/200 mL premix        1,000 mg 200 mL/hr over 60 Minutes Intravenous  Once 08/19/12 0909     08/18/12 2200   ceFEPIme (MAXIPIME) 2 g in dextrose 5 % 50 mL IVPB        2 g 100 mL/hr over 30 Minutes Intravenous Every 24 hours 08/18/12 1609     08/18/12 1630   clindamycin (CLEOCIN) IVPB 900 mg        900 mg 100 mL/hr over 30 Minutes Intravenous 3 times per day 08/18/12 1559     08/18/12 1600   clindamycin (CLEOCIN) IVPB 300 mg  Status:  Discontinued        300 mg 100 mL/hr over 30 Minutes Intravenous 3 times per day 08/18/12 1525 08/18/12 1559   08/17/12 0100   ceFEPIme (MAXIPIME) 1 g in dextrose 5 % 50 mL IVPB  Status:  Discontinued        1 g 100 mL/hr over 30 Minutes Intravenous Every 24 hours 08/17/12 0030 08/18/12 1609   08/17/12 0100   vancomycin (VANCOCIN) 2,500 mg in sodium chloride 0.9 % 500 mL IVPB        2,500 mg 250 mL/hr over 120 Minutes Intravenous  Once 08/17/12 0059 08/17/12 0841   08/17/12 0020   ceFEPIme (MAXIPIME) 1 g in dextrose 5 % 50 mL IVPB  Status:  Discontinued        1 g 100 mL/hr over 30 Minutes Intravenous Every 12 hours 08/17/12 0020 08/17/12 0029   08/16/12 2300   vancomycin (VANCOCIN) IVPB 1000 mg/200 mL premix  Status:  Discontinued        1,000 mg 200 mL/hr over 60 Minutes Intravenous  Once 08/16/12 2250 08/17/12 0032   08/16/12 2100   clindamycin (CLEOCIN) IVPB 900 mg        900 mg 100 mL/hr over 30 Minutes Intravenous  Once 08/16/12 2048 08/16/12 2219          Medications:    Scheduled:   . aspirin EC  81 mg Oral Daily  . ceFEPime (MAXIPIME) IV  2 g Intravenous Q24H  . clindamycin (CLEOCIN) IV  900 mg Intravenous Q8H  . DULoxetine  60 mg Oral Daily  . ferrous sulfate  325 mg Oral BID AC  . folic acid  1 mg Oral Daily  . furosemide  60 mg Oral Daily  . heparin  5,000 Units Subcutaneous Q8H  .  HYDROmorphone (DILAUDID) injection  1 mg Intravenous Once  . insulin aspart  0-20 Units Subcutaneous TID WC  . insulin aspart  0-5 Units Subcutaneous QHS  . insulin aspart  6 Units Subcutaneous TID WC  . levothyroxine  50 mcg Oral QAC breakfast  . magnesium oxide  400 mg Oral BID  . sevelamer  2,400 mg Oral TID WC  . simvastatin  20 mg Oral q1800  . sodium bicarbonate  1,300 mg Oral BID  . sodium chloride  3 mL Intravenous Q12H  . vancomycin  1,000 mg Intravenous Once  .  zinc sulfate  220 mg Oral Daily    Objective: Vital signs in last 24 hours: Temp:  [97.4 F (36.3 C)-98.4 F (36.9 C)] 97.8 F (36.6 C) (09/23 1348) Pulse Rate:  [70-84] 71  (09/23 1348) Resp:  [18-21] 18  (09/23 1348) BP: (104-120)/(52-61) 111/53 mmHg (09/23 1348) SpO2:  [95 %-98 %] 98 % (09/23 1348) Weight:  [163.7 kg (360 lb 14.3 oz)] 163.7 kg (360 lb 14.3 oz) (09/22 2126)   General appearance: alert, cooperative and no distress Chest wall: no tenderness, os from wound on R posterior chest, no d/c.  odor present  Lab Results  [facility] 08/19/12 0624 08/18/12 0522  WBC 15.6* 15.9*  HGB 8.3* 8.2*  HCT 27.4* 26.8*  NA 134* 132*  K 5.4* 5.3*  CL 100 99  CO2 21 18*  BUN 83* 81*  CREATININE 5.79* 6.20*  GLU -- --   Liver Panel No results found for this basename: PROT:2,ALBUMIN:2,AST:2,ALT:2,ALKPHOS:2,BILITOT:2,BILIDIR:2,IBILI:2 in the last 72 hours Sedimentation Rate No results found for this basename: ESRSEDRATE in the last 72 hours C-Reactive Protein No results found for this basename: CRP:2 in the last 72 hours  Microbiology: Recent Results (from the past 240 hour(s))   CULTURE, BLOOD (ROUTINE X 2)     Status: Normal (Preliminary result)   Collection Time   08/18/12  4:05 PM      Component Value Range Status Comment   Specimen Description BLOOD HAND LEFT   Final    Special Requests BOTTLES DRAWN AEROBIC AND ANAEROBIC 10CC   Final    Culture  Setup Time 08/18/2012 20:13   Final    Culture     Final    Value:        BLOOD CULTURE RECEIVED NO GROWTH TO DATE CULTURE WILL BE HELD FOR 5 DAYS BEFORE ISSUING A FINAL NEGATIVE REPORT   Report Status PENDING   Incomplete   CULTURE, BLOOD (ROUTINE X 2)     Status: Normal (Preliminary result)   Collection Time   08/18/12  4:10 PM      Component Value Range Status Comment   Specimen Description BLOOD HAND RIGHT   Final    Special Requests BOTTLES DRAWN AEROBIC AND ANAEROBIC 10CC   Final    Culture  Setup Time 08/18/2012 20:13   Final    Culture     Final    Value:        BLOOD CULTURE RECEIVED NO GROWTH TO DATE CULTURE WILL BE HELD FOR 5 DAYS BEFORE ISSUING A FINAL NEGATIVE REPORT   Report Status PENDING   Incomplete     Studies/Results: No results found.   Assessment/Plan: Hidradenitis suppurativa Total days of antibiotics 3 (vanco/cefepime/clinda)   No change in anbx for now To have discussion with CM, surgery, family re: surgery. She believes a simple I & D will be done and she has had this before and states it did not work. Watch for s/s C diff on clinda         Bobby Rumpf Infectious Diseases [id number] 08/19/2012, 4:59 PM   LOS: 3 days

## 2012-08-19 NOTE — Progress Notes (Signed)
I spoke with the patient and they were thinking that surgery here at [facility] would involve skin grafting, etc.  That was what she was expecting after going to [facility].  I told her that we would need to refer her to [facility] to get that done as she is not interested in having I & D.  Accutane might be a good alternative for her.

## 2012-08-20 DIAGNOSIS — N200 Calculus of kidney: Secondary | ICD-10-CM

## 2012-08-20 LAB — GLUCOSE, CAPILLARY
Glucose-Capillary: 86 mg/dL (ref 70–99)
Glucose-Capillary: 85 mg/dL (ref 70–99)
Glucose-Capillary: 101 mg/dL — ABNORMAL HIGH (ref 70–99)
Glucose-Capillary: 132 mg/dL — ABNORMAL HIGH (ref 70–99)

## 2012-08-20 MED ORDER — OXYCODONE-ACETAMINOPHEN 5-325 MG PO TABS
1.0000 | ORAL_TABLET | ORAL | Status: DC | PRN
  Administered 2012-08-20 – 2012-08-23 (×7): 1 via ORAL
  Filled 2012-08-20 (×7): qty 1

## 2012-08-20 MED ORDER — INSULIN ASPART 100 UNIT/ML ~~LOC~~ SOLN
6.0000 [IU] | Freq: Once | SUBCUTANEOUS | Status: AC
  Administered 2012-08-20: 13:00:00 via SUBCUTANEOUS

## 2012-08-20 NOTE — Progress Notes (Signed)
Patients has stent and probable residual right ureteral stone I recommend CT urogram before patient goes home to document stone and future management Will speak to primary team

## 2012-08-20 NOTE — Progress Notes (Signed)
   CARE MANAGEMENT NOTE 08/20/2012  Patient:  [facility]   Account Number:  [id number]  Date Initiated:  08/20/2012  Documentation initiated by:  Lizabeth Leyden  Subjective/Objective Assessment:   Patient admitted with anemia and supurative hyadenitist     Action/Plan:   Progression of care and discharge planning   Anticipated DC Date:     Anticipated DC Plan:  [place]  CM consult      Choice offered to / List presented to:             Status of service:  In process, will continue to follow Medicare Important Message given?   (If response is "NO", the following Medicare IM given date fields will be blank) Date Medicare IM given:   Date Additional Medicare IM given:    Discharge Disposition:    Per UR Regulation:  Reviewed for med. necessity/level of care/duration of stay  If discussed at [place] of Stay Meetings, dates discussed:    Comments:  08/20/2012  [place], [place] [phone] Late entry for 08/19/2012. Met with patient and sister to discuss discharge planning, and answer questions related to insurance co pays. Advised her financial counselor contacted for insurance information. Suggested she contact customer service phone number on back of the insurance card.  Spoke with SW regarding questions about applying for disability. The patient talked about her treatment plan at [facility], to care for her wounds and have plastic surgery the process would take about a year. She was told by MD at [facility] the treatment plan would be to I&D the area and pack the wound. She had that done previously and then went to [facility] and it did not work. She may go back to [place] to start the process. CM to continue to follow for discharge needs.  Financial Counselor: [name] left voice mail message requesting follow up with patient regarding insurance coverage.

## 2012-08-20 NOTE — Progress Notes (Signed)
Patient refused CT scan secondary to doesn't feel like she can lie flat even for 5 minutes to obtain CT due to states SOB when she lies flat, Shelley Nordmann Rn

## 2012-08-20 NOTE — Progress Notes (Signed)
[facility]             Progress Note   Subjective: No chest pain, afebrile, reports no further SOB. After discussing with the patient pros and cons of no having surgery to perform debridement of her current wounds and the fact of her ongoing infection despite current antibiotic regimen and suppressive antibiotics regimen in the past, patient has now decided to go to [facility] after discharge for further evaluation and treatment.  subjective: Vital signs in last 24 hours: Temp:  [97.2 F (36.2 C)-98.3 F (36.8 C)] 97.9 F (36.6 C) (09/24 1411) Pulse Rate:  [66-73] 73  (09/24 1411) Resp:  [14-21] 21  (09/24 1411) BP: (94-122)/(53-61) 114/57 mmHg (09/24 1411) SpO2:  [94 %-97 %] 96 % (09/24 0800) Weight:  [165.7 kg (365 lb 4.8 oz)] 165.7 kg (365 lb 4.8 oz) (09/23 2205) Weight change: 2 kg (4 lb 6.6 oz) Last BM Date: 08/16/12  Intake/Output from previous day: 09/23 0701 - 09/24 0700 In: 360 [P.O.:360] Out: 1300 [Urine:1300] Total I/O In: 290 [P.O.:290] Out: 600 [Urine:600]   Physical Exam: General: Alert, awake, oriented x3, morbidly obese. Able to speak in full sentences. HEENT: No bruits, no goiter. Heart: Regular rate and rhythm, without murmurs, rubs, gallops. Lungs: Mild basilar rhonchi/crackles; otherwise clear to auscultation bilaterally. Abdomen: Obese, Soft, nontender, nondistended, positive bowel sounds. Extremities: No clubbing, cyanosis or edema with positive pedal pulses. Neuro: Grossly intact, nonfocal. She has not been ambulated. Skin: Has multiple areas of active hydradenitis under her breasts, pannus and worse in her groin/bottom area. They are all purulent and foul-smelling.   Lab Results: Basic Metabolic Panel:  Basename 08/19/12 0624 08/18/12 0522  NA 134* 132*  K 5.4* 5.3*  CL 100 99  CO2 21 18*  GLUCOSE 107* 93  BUN 83* 81*  CREATININE 5.79* 6.20*  CALCIUM 8.2* 8.0*  MG -- --  PHOS -- --   CBC:  Basename 08/19/12 0624 08/18/12  0522  WBC 15.6* 15.9*  NEUTROABS -- --  HGB 8.3* 8.2*  HCT 27.4* 26.8*  MCV 88.4 89.0  PLT 332 339   CBG:  Basename 08/20/12 1153 08/20/12 0720 08/19/12 2209 08/19/12 1704 08/19/12 1204 08/19/12 0732  GLUCAP 85 86 147* 93 145* 98   Studies/Results: No results found.  Medications: Scheduled Meds:    . aspirin EC  81 mg Oral Daily  . clindamycin (CLEOCIN) IV  900 mg Intravenous Q8H  . DULoxetine  60 mg Oral Daily  . ferrous sulfate  325 mg Oral BID AC  . folic acid  1 mg Oral Daily  . furosemide  60 mg Oral Daily  . heparin  5,000 Units Subcutaneous Q8H  .  HYDROmorphone (DILAUDID) injection  1 mg Intravenous Once  . insulin aspart  0-20 Units Subcutaneous TID WC  . insulin aspart  0-5 Units Subcutaneous QHS  . insulin aspart  6 Units Subcutaneous TID WC  . insulin aspart  6 Units Subcutaneous Once  . levothyroxine  50 mcg Oral QAC breakfast  . magnesium oxide  400 mg Oral BID  . sevelamer  2,400 mg Oral TID WC  . simvastatin  20 mg Oral q1800  . sodium bicarbonate  1,300 mg Oral BID  . sodium chloride  3 mL Intravenous Q12H  . vancomycin  1,000 mg Intravenous Once  . zinc sulfate  220 mg Oral Daily  . DISCONTD: ceFEPime (MAXIPIME) IV  2 g Intravenous Q24H   Continuous Infusions:  PRN Meds:.sodium chloride, cyclobenzaprine, HYDROmorphone (  DILAUDID) injection, oxyCODONE-acetaminophen, sodium chloride  Assessment/Plan:  Principal Problem:  *Hidradenitis suppurativa Active Problems:  Morbid obesity  Chronic kidney disease (CKD), stage IV-V  Anemia  ARF (acute renal failure)  Sepsis associated hypotension Nephrolithiasis  Early Sepsis with hypotension -BP continue to be stable and patient afebrile now. -Will continue to hold all BP meds: coreg, imdur, hydralazine. -Judicious morphine use. -I believe 2/2 severe hydradenitis suppurativa and antihypertensive drugs. -Patient restarted on PO lasix 60mg  daily; due to SOB and mild crackles after IV  resuscitation  Hydradenitis Suppurative -after discussing pros and cons one more time of ongoing infection and needs for surgery; she discussed with in-house surgery service and final decision is to go to [facility] for further evaluation and treatment (patient will require extensive debridement and even skin grafts; not offered by surgeon here). -Will continue clindamycin by mouth and also topically (last one to be apply as an outpatient 4-6 times a day after good skin hygiene of her open wounds).; suppressive therapy recommended by ID.-Have asked ID to see for recommendation on chronic suppressive therapy.  Acute on CKD Stage IV-V -Baseline Cr around 5. -Follows with Dr. Florene Glen as an outpatient. -Renal function now returning back to baseline; 5.7 today -Needs to follow up with Dr. Florene Glen upon DC. -No acute indications for dialysis at this moment; will monitor  Hypothyroidism -TSH high. -Continue synthroid at 50 mcg; will need TSH and free T 4 recheck in 4-6 weeks.  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 -Has received 2 u PRBCs. -No signs of active bleeding. -Last Hb 8.3 -will monitor  Nephrolithiasis -as per urology rec's will check CT urogram -urology will follow results and follow as an outpatient    Time spent coordinating care: 35 minutes.   LOS: 4 days   [name] [facility] Pager: [phone] 08/20/2012, 3:35 PM

## 2012-08-20 NOTE — Progress Notes (Signed)
INFECTIOUS DISEASE PROGRESS NOTE  ID: [name] is a 51 y.o. female with   Principal Problem:  *Hidradenitis suppurativa Active Problems:  Morbid obesity  Chronic kidney disease (CKD), stage III (moderate)  Anemia  ARF (acute renal failure)  Sepsis associated hypotension  Subjective: Without complaints. Denies change in wounds, d/c.   Abtx:  Anti-infectives     Start     Dose/Rate Route Frequency Ordered Stop   08/19/12 2100   vancomycin (VANCOCIN) IVPB 1000 mg/200 mL premix        1,000 mg 200 mL/hr over 60 Minutes Intravenous  Once 08/19/12 0909 08/19/12 2153   08/18/12 2200   ceFEPIme (MAXIPIME) 2 g in dextrose 5 % 50 mL IVPB  Status:  Discontinued        2 g 100 mL/hr over 30 Minutes Intravenous Every 24 hours 08/18/12 1609 08/20/12 1534   08/18/12 1630   clindamycin (CLEOCIN) IVPB 900 mg        900 mg 100 mL/hr over 30 Minutes Intravenous 3 times per day 08/18/12 1559     08/18/12 1600   clindamycin (CLEOCIN) IVPB 300 mg  Status:  Discontinued        300 mg 100 mL/hr over 30 Minutes Intravenous 3 times per day 08/18/12 1525 08/18/12 1559   08/17/12 0100   ceFEPIme (MAXIPIME) 1 g in dextrose 5 % 50 mL IVPB  Status:  Discontinued        1 g 100 mL/hr over 30 Minutes Intravenous Every 24 hours 08/17/12 0030 08/18/12 1609   08/17/12 0100   vancomycin (VANCOCIN) 2,500 mg in sodium chloride 0.9 % 500 mL IVPB        2,500 mg 250 mL/hr over 120 Minutes Intravenous  Once 08/17/12 0059 08/17/12 0841   08/17/12 0020   ceFEPIme (MAXIPIME) 1 g in dextrose 5 % 50 mL IVPB  Status:  Discontinued        1 g 100 mL/hr over 30 Minutes Intravenous Every 12 hours 08/17/12 0020 08/17/12 0029   08/16/12 2300   vancomycin (VANCOCIN) IVPB 1000 mg/200 mL premix  Status:  Discontinued        1,000 mg 200 mL/hr over 60 Minutes Intravenous  Once 08/16/12 2250 08/17/12 0032   08/16/12 2100   clindamycin (CLEOCIN) IVPB 900 mg        900 mg 100 mL/hr over 30 Minutes Intravenous  Once  08/16/12 2048 08/16/12 2219          Medications:  Scheduled:   . aspirin EC  81 mg Oral Daily  . clindamycin (CLEOCIN) IV  900 mg Intravenous Q8H  . DULoxetine  60 mg Oral Daily  . ferrous sulfate  325 mg Oral BID AC  . folic acid  1 mg Oral Daily  . furosemide  60 mg Oral Daily  . heparin  5,000 Units Subcutaneous Q8H  .  HYDROmorphone (DILAUDID) injection  1 mg Intravenous Once  . insulin aspart  0-20 Units Subcutaneous TID WC  . insulin aspart  0-5 Units Subcutaneous QHS  . insulin aspart  6 Units Subcutaneous TID WC  . insulin aspart  6 Units Subcutaneous Once  . levothyroxine  50 mcg Oral QAC breakfast  . magnesium oxide  400 mg Oral BID  . sevelamer  2,400 mg Oral TID WC  . simvastatin  20 mg Oral q1800  . sodium bicarbonate  1,300 mg Oral BID  . sodium chloride  3 mL Intravenous Q12H  . vancomycin  1,000 mg Intravenous Once  . zinc sulfate  220 mg Oral Daily  . DISCONTD: ceFEPime (MAXIPIME) IV  2 g Intravenous Q24H    Objective: Vital signs in last 24 hours: Temp:  [97.2 F (36.2 C)-98.3 F (36.8 C)] 97.9 F (36.6 C) (09/24 1411) Pulse Rate:  [66-73] 73  (09/24 1411) Resp:  [14-21] 21  (09/24 1411) BP: (94-122)/(53-61) 114/57 mmHg (09/24 1411) SpO2:  [94 %-97 %] 96 % (09/24 0800) Weight:  [165.7 kg (365 lb 4.8 oz)] 165.7 kg (365 lb 4.8 oz) (09/23 2205)   General appearance: alert, cooperative and morbidly obese Incision/Wound: gluteal wound unchanged. Mild decrease in odor.   Lab Results  [facility] 08/19/12 0624 08/18/12 0522  WBC 15.6* 15.9*  HGB 8.3* 8.2*  HCT 27.4* 26.8*  NA 134* 132*  K 5.4* 5.3*  CL 100 99  CO2 21 18*  BUN 83* 81*  CREATININE 5.79* 6.20*  GLU -- --   Liver Panel No results found for this basename: PROT:2,ALBUMIN:2,AST:2,ALT:2,ALKPHOS:2,BILITOT:2,BILIDIR:2,IBILI:2 in the last 72 hours Sedimentation Rate No results found for this basename: ESRSEDRATE in the last 72 hours C-Reactive Protein No results found for this  basename: CRP:2 in the last 72 hours  Microbiology: Recent Results (from the past 240 hour(s))  CULTURE, BLOOD (ROUTINE X 2)     Status: Normal (Preliminary result)   Collection Time   08/18/12  4:05 PM      Component Value Range Status Comment   Specimen Description BLOOD HAND LEFT   Final    Special Requests BOTTLES DRAWN AEROBIC AND ANAEROBIC 10CC   Final    Culture  Setup Time 08/18/2012 20:13   Final    Culture     Final    Value:        BLOOD CULTURE RECEIVED NO GROWTH TO DATE CULTURE WILL BE HELD FOR 5 DAYS BEFORE ISSUING A FINAL NEGATIVE REPORT   Report Status PENDING   Incomplete   CULTURE, BLOOD (ROUTINE X 2)     Status: Normal (Preliminary result)   Collection Time   08/18/12  4:10 PM      Component Value Range Status Comment   Specimen Description BLOOD HAND RIGHT   Final    Special Requests BOTTLES DRAWN AEROBIC AND ANAEROBIC 10CC   Final    Culture  Setup Time 08/18/2012 20:13   Final    Culture     Final    Value:        BLOOD CULTURE RECEIVED NO GROWTH TO DATE CULTURE WILL BE HELD FOR 5 DAYS BEFORE ISSUING A FINAL NEGATIVE REPORT   Report Status PENDING   Incomplete     Studies/Results: No results found.   Assessment/Plan: Hidradenitis suppurativa  Total days of antibiotics 4 (vanco/cefepime/clinda)  No change in anbx for now  Watch for s/s C diff on clinda Wants to have her surgery done at [facility].  Would continue her current anbx til she is d/c, then transition to clinda 300mg  tid. Continue on this until she has f/u at [facility].  Could also add topical clinda at d/c, 4x/day  Available if questions, thanks  Bobby Rumpf Infectious Diseases [id number] 08/20/2012, 3:36 PM   LOS: 4 days

## 2012-08-20 NOTE — Progress Notes (Signed)
Patient ID: [name], female   DOB: 1961/06/30, 51 y.o.   MRN: [id number]  I again spoke at length with the patient concerning her treatment options which included undergoing an I&D procedure here at [facility] as well as beginning her on Accutane in an effort to dry up some of these abscess. Patient stated that the Accutane option was discussed with her at [facility] and that she did not wish to pursue that treatment option because of the " side effects". She again stated that she did not want I&D. She was informed that our recommendation would be for her to return to [facility] for treatment. She verbally acknowledged this and I will also discuss with Dr. Dyann Kief our recommendations.  At this time we will sign off.

## 2012-08-21 ENCOUNTER — Inpatient Hospital Stay (HOSPITAL_COMMUNITY): Payer: BC Managed Care – PPO

## 2012-08-21 ENCOUNTER — Encounter (HOSPITAL_COMMUNITY): Payer: Self-pay | Admitting: Family Medicine

## 2012-08-21 LAB — BASIC METABOLIC PANEL
Calcium: 8.1 mg/dL — ABNORMAL LOW (ref 8.4–10.5)
Creatinine, Ser: 4.75 mg/dL — ABNORMAL HIGH (ref 0.50–1.10)
GFR calc Af Amer: 11 mL/min — ABNORMAL LOW (ref >90)
GFR calc non Af Amer: 10 mL/min — ABNORMAL LOW (ref >90)

## 2012-08-21 LAB — RETICULOCYTES
Retic Count, Absolute: 120.2 10*3/uL (ref 19.0–186.0)
Retic Ct Pct: 3.6 % — ABNORMAL HIGH (ref 0.4–3.1)

## 2012-08-21 LAB — CBC
MCH: 27 pg (ref 26.0–34.0)
MCV: 89.8 fL (ref 78.0–100.0)
Platelets: 363 10*3/uL (ref 150–400)
RDW: 17.3 % — ABNORMAL HIGH (ref 11.5–15.5)

## 2012-08-21 LAB — IRON AND TIBC
Iron: 28 ug/dL — ABNORMAL LOW (ref 42–135)
Saturation Ratios: 14 % — ABNORMAL LOW (ref 20–55)
TIBC: 200 ug/dL — ABNORMAL LOW (ref 250–470)
UIBC: 172 ug/dL (ref 125–400)

## 2012-08-21 LAB — URINE CULTURE

## 2012-08-21 MED ORDER — CARVEDILOL 3.125 MG PO TABS
3.1250 mg | ORAL_TABLET | Freq: Two times a day (BID) | ORAL | Status: DC
  Administered 2012-08-21 – 2012-08-23 (×4): 3.125 mg via ORAL
  Filled 2012-08-21 (×6): qty 1

## 2012-08-21 MED ORDER — FUROSEMIDE 80 MG PO TABS
160.0000 mg | ORAL_TABLET | Freq: Two times a day (BID) | ORAL | Status: DC
  Administered 2012-08-21 – 2012-08-23 (×4): 160 mg via ORAL
  Filled 2012-08-21 (×6): qty 2

## 2012-08-21 MED ORDER — CARVEDILOL 6.25 MG PO TABS
6.2500 mg | ORAL_TABLET | Freq: Two times a day (BID) | ORAL | Status: DC
  Administered 2012-08-21: 6.25 mg via ORAL
  Filled 2012-08-21 (×3): qty 1

## 2012-08-21 MED ORDER — DARBEPOETIN ALFA-POLYSORBATE 200 MCG/0.4ML IJ SOLN
200.0000 ug | INTRAMUSCULAR | Status: DC
  Administered 2012-08-21: 200 ug via SUBCUTANEOUS
  Filled 2012-08-21: qty 0.4

## 2012-08-21 MED ORDER — SODIUM BICARBONATE 650 MG PO TABS
1300.0000 mg | ORAL_TABLET | Freq: Three times a day (TID) | ORAL | Status: DC
  Administered 2012-08-21 – 2012-08-23 (×6): 1300 mg via ORAL
  Filled 2012-08-21 (×8): qty 2

## 2012-08-21 MED ORDER — FUROSEMIDE 40 MG PO TABS
60.0000 mg | ORAL_TABLET | Freq: Two times a day (BID) | ORAL | Status: DC
  Administered 2012-08-21: 60 mg via ORAL
  Filled 2012-08-21 (×3): qty 1

## 2012-08-21 MED ORDER — SODIUM POLYSTYRENE SULFONATE 15 GM/60ML PO SUSP
15.0000 g | Freq: Once | ORAL | Status: AC
  Administered 2012-08-21: 15 g via ORAL
  Filled 2012-08-21: qty 60

## 2012-08-21 NOTE — Progress Notes (Signed)
Pt. Refused CT scan,MD. Aware,keep monitoring pt. Closely and assessing her needs

## 2012-08-21 NOTE — ED Provider Notes (Signed)
Medical screening examination/treatment/procedure(s) were performed by non-physician practitioner and as supervising physician I was immediately available for consultation/collaboration.  Virgel Manifold, MD 08/21/12 [phone]

## 2012-08-21 NOTE — Progress Notes (Signed)
Will continue to follow patient Please recommend CT scan at [facility] and for her to call my office when she get home Perhaps urology would see her at [facility]

## 2012-08-21 NOTE — Progress Notes (Addendum)
[facility]             Progress Note   Subjective: Sob with exertion/activity  subjective: Vital signs in last 24 hours: Temp:  [97.4 F (36.3 C)-98.4 F (36.9 C)] 98.4 F (36.9 C) (09/25 [id number]) Pulse Rate:  [68-75] 75  (09/25 0608) Resp:  [20-21] 20  (09/25 0608) BP: (114-126)/(46-57) 126/46 mmHg (09/25 0608) SpO2:  [94 %-96 %] 94 % (09/25 0608) Weight:  [167.377 kg (369 lb)] 167.377 kg (369 lb) (09/24 2113) Weight change: 1.677 kg (3 lb 11.2 oz) Last BM Date: 08/20/12  Intake/Output from previous day: 09/24 0701 - 09/25 0700 In: 1290 [P.O.:770; I.V.:220; IV Piggyback:300] Out: 1800 [Urine:1800]     Physical Exam: General: Alert, awake, oriented x3, morbidly obese. Able to speak in full sentences. HEENT: No bruits, no goiter. Heart: Regular rate and rhythm, without murmurs, rubs, gallops. Lungs: Mild basilar rhonchi/crackles; otherwise clear to auscultation bilaterally. Abdomen: Obese, Soft, nontender, nondistended, positive bowel sounds. Extremities: No clubbing, cyanosis, trace edema with positive pedal pulses. Neuro: Grossly intact, nonfocal. She has not been ambulated. Skin: Has multiple areas of active hydradenitis under her breasts, pannus and worse in her groin/bottom area. They are all purulent and foul-smelling.   Lab Results: Basic Metabolic Panel:  Basename 08/21/12 0715 08/19/12 0624  NA 137 134*  K 5.5* 5.4*  CL 103 100  CO2 21 21  GLUCOSE 97 107*  BUN 79* 83*  CREATININE 4.75* 5.79*  CALCIUM 8.1* 8.2*  MG -- --  PHOS -- --   CBC:  Basename 08/21/12 0715 08/19/12 0624  WBC 15.2* 15.6*  NEUTROABS -- --  HGB 7.9* 8.3*  HCT 26.3* 27.4*  MCV 89.8 88.4  PLT 363 332   CBG:  Basename 08/21/12 0745 08/20/12 2117 08/20/12 1649 08/20/12 1153 08/20/12 0720 08/19/12 2209  GLUCAP 94 132* 101* 85 86 147*   Studies/Results: No results found.  Medications: Scheduled Meds:    . aspirin EC  81 mg Oral Daily  . clindamycin  (CLEOCIN) IV  900 mg Intravenous Q8H  . DULoxetine  60 mg Oral Daily  . ferrous sulfate  325 mg Oral BID AC  . folic acid  1 mg Oral Daily  . furosemide  60 mg Oral Daily  . heparin  5,000 Units Subcutaneous Q8H  .  HYDROmorphone (DILAUDID) injection  1 mg Intravenous Once  . insulin aspart  0-20 Units Subcutaneous TID WC  . insulin aspart  0-5 Units Subcutaneous QHS  . insulin aspart  6 Units Subcutaneous TID WC  . insulin aspart  6 Units Subcutaneous Once  . levothyroxine  50 mcg Oral QAC breakfast  . magnesium oxide  400 mg Oral BID  . sevelamer  2,400 mg Oral TID WC  . simvastatin  20 mg Oral q1800  . sodium bicarbonate  1,300 mg Oral BID  . sodium chloride  3 mL Intravenous Q12H  . zinc sulfate  220 mg Oral Daily  . DISCONTD: ceFEPime (MAXIPIME) IV  2 g Intravenous Q24H   Continuous Infusions:  PRN Meds:.sodium chloride, cyclobenzaprine, HYDROmorphone (DILAUDID) injection, oxyCODONE-acetaminophen, sodium chloride, DISCONTD: oxyCODONE-acetaminophen  Assessment/Plan:  Principal Problem:  *Hidradenitis suppurativa Active Problems:  Morbid obesity  Chronic kidney disease (CKD), stage IV-V  Anemia  ARF (acute renal failure)  Sepsis associated hypotension Nephrolithiasis  Early Sepsis with hypotension -BP continue to be stable and patient afebrile now. -resume coreg, hold imdur, hydralazine. -I believe 2/2 severe hydradenitis suppurativa and antihypertensive drugs.   Hydradenitis Suppurative -after  discussing pros and cons one more time of ongoing infection and needs for surgery; she discussed with in-house surgery service and final decision is to go to [facility] for further evaluation and treatment (patient will require extensive debridement and even skin grafts; not offered by surgeon here). -Will continue clindamycin by mouth and also topically (last one to be apply as an outpatient 4-6 times a day after good skin hygiene of her open wounds).; suppressive therapy  recommended by ID  Acute on CKD Stage IV-V -Baseline Cr around 5. -Follows with Dr. Florene Glen as an outpatient. -Renal function now returning back to baseline -K has been in 5.4 to 5.5 range, give kayexalate x1 again, Renal diet - will ask for Renal input Will increase diuretic dose, ? 5kg weight gained but I/O negative  Hypothyroidism -TSH high. -Continue synthroid at 50 mcg; will need TSH and free T 4 recheck in 4-6 weeks.  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 -Has received 2 u PRBCs. -No signs of active bleeding. - Hb today 7.9 -will monitor  Nephrolithiasis -was unable to have  CT urogram due to dyspnea, declined CT on 2 occasions -Urology follwoing       LOS: 5 days   [facility] Pager: [phone] 08/21/2012, 8:35 AM

## 2012-08-21 NOTE — Progress Notes (Signed)
Pt had a blood pressure of 114/38. MD made aware and stated it was ok to give the coreg

## 2012-08-21 NOTE — Clinical Social Work Psychosocial (Signed)
     Clinical Social Work Department BRIEF PSYCHOSOCIAL ASSESSMENT 08/21/2012  Patient:  [facility]     Account Number:  [id number]     Admit date:  08/16/2012  Clinical Social Worker:  Frederico Hamman  Date/Time:  08/21/2012 12:26 PM  Referred by:  RN  Date Referred:  08/19/2012 Referred for  Other - See comment   Other Referral:   Patient requested information regarding applying for disability   Interview type:  Patient Other interview type:   CSW also talked with patient's sister who was at the bedside.    PSYCHOSOCIAL DATA Living Status:  HUSBAND Admitted from facility:   Level of care:   Primary support name:  Helga Alamilla Primary support relationship to patient:  SPOUSE Degree of support available:   Patient also has a sister that was at the bedside on 08/20/12    CURRENT CONCERNS Current Concerns  Financial Resources   Other Concerns:   Questions about disability    SOCIAL WORK ASSESSMENT / PLAN On 08/20/12 CSW talked with patient regarding applying for SSA benefits. Discussed general information regarding SSA disability, answered questions, and encouraged patient to go online (patient has tablet) to get more information and advised patient that she can also apply online.   Assessment/plan status:  Information/Referral to [employer] Other assessment/ plan:   Information/referral to community resources:   Patient given website address    PATIENTS/FAMILYS RESPONSE TO PLAN OF CARE: Patient and sister appeared appreciative of CSW's information.

## 2012-08-21 NOTE — Consult Note (Signed)
[facility] CONSULT NOTE    Date: 08/21/2012                  Patient Name:  [name]  MRN: [id number]  DOB: February 02, 1961  Age / Sex: 51 y.o., female         PCP: [name], MD                 Service Requesting Consult: Internal Medicine - [facility]                 Reason for Consult: Acute on Chronic Kidney Injury            History of Present Illness: Patient is a 51 y.o. 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 Her hemoglobin was 6.8 upon arrival, which the patient and her PCP believed was secondary to chronic bleeding from hydradenitis suppurativa of the inguinal area. This is being treated by the infectious disease team. The patient was given 2 units of PRBC's and her Hgb improved to 7.7. The patient notes that she takes PO iron tablets at home daily to treat the anemia, but she has chronic problems with it. Additionally, the patient was found to be hypotensive to 80's/40's on the morning of 9/21. She was given fluids and all hypertension medication were held as well. A sepsis work up was started but, the patient was determined not to be septic. She did however develop fluid overload and shortness of breath and was given her home dose of furosemide 60 mg daily. It was noted on admission that the patient's creatinine was 6, up from her baseline of 4.3. It has trended down since that time, but her primary doctor was concerned about fluid overload and consulted the renal team. Currently, the patient notes that she has shortness of breath with any movement, including minimal walking. She also feels like she is holding additional fluid on her abdomen and that he dry weight is 350 lbs. This started approximately one week prior to hospitalization and has not resolved. She denies missing any doses of medication prior to  hospitalization and does not take NSAIDS. She reports having hemodialysis once while inpatient, but does not have access for dialysis and has not planned to have access placed.    Medications: Outpatient medications: Prescriptions prior to admission  Medication Sig Dispense Refill  . aspirin EC 81 MG EC tablet Take 1 tablet (81 mg total) by mouth daily.  30 tablet  3  . carvedilol (COREG) 6.25 MG tablet Take 6.25 mg by mouth 2 (two) times daily with a meal.      . cyclobenzaprine (FLEXERIL) 10 MG tablet Take 10 mg by mouth 3 (three) times daily as needed. For Spasms      . DULoxetine (CYMBALTA) 60 MG capsule Take 60 mg by mouth daily.        . ferrous sulfate 325 (65 FE) MG tablet Take 325 mg by mouth 2 (two) times daily.        . folic acid (FOLVITE) 1 MG tablet Take 1 mg by mouth daily.      . furosemide (LASIX) 80 MG tablet Take 1 tablet (80 mg total) by mouth 2 (two) times daily.  60 tablet  0  . hydrALAZINE (APRESOLINE) 25 MG tablet Take 37.5 mg by mouth 3 (three) times daily.      Marland Kitchen  isosorbide mononitrate (IMDUR) 30 MG 24 hr tablet Take 30 mg by mouth daily.      Marland Kitchen levothyroxine (SYNTHROID, LEVOTHROID) 25 MCG tablet Take 25 mcg by mouth daily.      . magnesium oxide (MAG-OX) 400 MG tablet Take 400 mg by mouth 2 (two) times daily.       Marland Kitchen oxyCODONE-acetaminophen (PERCOCET) 5-325 MG per tablet Take 1 tablet by mouth every 6 (six) hours as needed. For pain      . potassium chloride SA (K-DUR,KLOR-CON) 20 MEQ tablet Take 20 mEq by mouth 2 (two) times daily.      . pravastatin (PRAVACHOL) 40 MG tablet Take 40 mg by mouth daily.        . sevelamer (RENVELA) 800 MG tablet Take 3 tablets (2,400 mg total) by mouth 3 (three) times daily with meals.  180 tablet  0  . sodium bicarbonate 650 MG tablet Take 2 tablets (1,300 mg total) by mouth 2 (two) times daily.  120 tablet  0  . zinc sulfate 220 MG capsule Take 220 mg by mouth daily.        Current medications: Current Facility-Administered  Medications  Medication Dose Route Frequency Provider Last Rate Last Dose  . 0.9 %  sodium chloride infusion  250 mL Intravenous PRN Orvan Falconer, MD      . aspirin EC tablet 81 mg  81 mg Oral Daily Orvan Falconer, MD   81 mg at 08/21/12 1014  . carvedilol (COREG) tablet 6.25 mg  6.25 mg Oral BID WC Domenic Polite, MD   6.25 mg at 08/21/12 1014  . clindamycin (CLEOCIN) IVPB 900 mg  900 mg Intravenous Q8H Eudelia Bunch, PHARMD   900 mg at 08/21/12 0519  . cyclobenzaprine (FLEXERIL) tablet 10 mg  10 mg Oral TID PRN Orvan Falconer, MD      . DULoxetine (CYMBALTA) DR capsule 60 mg  60 mg Oral Daily Orvan Falconer, MD   60 mg at 08/21/12 1014  . ferrous sulfate tablet 325 mg  325 mg Oral BID AC Orvan Falconer, MD   325 mg at 08/21/12 0800  . folic acid (FOLVITE) tablet 1 mg  1 mg Oral Daily Orvan Falconer, MD   1 mg at 08/21/12 1014  . furosemide (LASIX) tablet 60 mg  60 mg Oral BID Domenic Polite, MD   60 mg at 08/21/12 1014  . heparin injection 5,000 Units  5,000 Units Subcutaneous Q8H Orvan Falconer, MD   5,000 Units at 08/21/12 0526  . HYDROmorphone (DILAUDID) injection 1 mg  1 mg Intravenous Once Wandra Arthurs, MD      . HYDROmorphone (DILAUDID) injection 1-2 mg  1-2 mg Intravenous Q3H PRN Orvan Falconer, MD      . insulin aspart (novoLOG) injection 0-20 Units  0-20 Units Subcutaneous TID WC Orvan Falconer, MD   3 Units at 08/19/12 1242  . insulin aspart (novoLOG) injection 0-5 Units  0-5 Units Subcutaneous QHS Orvan Falconer, MD      . insulin aspart (novoLOG) injection 6 Units  6 Units Subcutaneous TID WC Orvan Falconer, MD   6 Units at 08/21/12 [phone]  . insulin aspart (novoLOG) injection 6 Units  6 Units Subcutaneous Once [employer], PHARMD      . levothyroxine (SYNTHROID, LEVOTHROID) tablet 50 mcg  50 mcg Oral QAC breakfast Erline Hau, MD   50 mcg at 08/21/12 0800  . magnesium oxide (MAG-OX) tablet 400 mg  400 mg Oral BID Orvan Falconer,  MD   400 mg at 08/21/12 1014  . oxyCODONE-acetaminophen (PERCOCET/ROXICET) 5-325 MG per tablet 1 tablet  1  tablet Oral Q4H PRN Barton Dubois, MD   1 tablet at 08/21/12 0526  . sevelamer (RENVELA) tablet 2,400 mg  2,400 mg Oral TID WC Orvan Falconer, MD   2,400 mg at 08/21/12 [id number]  . simvastatin (ZOCOR) tablet 20 mg  20 mg Oral q1800 Orvan Falconer, MD   20 mg at 08/20/12 1746  . sodium bicarbonate tablet 1,300 mg  1,300 mg Oral BID Orvan Falconer, MD   1,300 mg at 08/21/12 1014  . sodium chloride 0.9 % injection 3 mL  3 mL Intravenous Q12H Orvan Falconer, MD   3 mL at 08/21/12 1014  . sodium chloride 0.9 % injection 3 mL  3 mL Intravenous PRN Orvan Falconer, MD      . sodium polystyrene (KAYEXALATE) 15 GM/60ML suspension 15 g  15 g Oral Once Domenic Polite, MD   15 g at 08/21/12 1015  . zinc sulfate capsule 220 mg  220 mg Oral Daily Orvan Falconer, MD   220 mg at 08/21/12 1014  . DISCONTD: ceFEPIme (MAXIPIME) 2 g in dextrose 5 % 50 mL IVPB  2 g Intravenous Q24H Eudelia Bunch, PHARMD   2 g at 08/19/12 2244  . DISCONTD: furosemide (LASIX) tablet 60 mg  60 mg Oral Daily Barton Dubois, MD   60 mg at 08/20/12 1001  . DISCONTD: oxyCODONE-acetaminophen (PERCOCET/ROXICET) 5-325 MG per tablet 1 tablet  1 tablet Oral Q6H PRN Orvan Falconer, MD   1 tablet at 08/20/12 1223      Allergies: Allergies  Allergen Reactions  . Amoxicillin-Pot Clavulanate Diarrhea  . Rosiglitazone Maleate     REACTION: swelling  . Amoxicillin Rash      Past Medical History: Past Medical History  Diagnosis Date  . Systolic congestive heart failure   . SOB (shortness of breath)   . HTN (hypertension)   . DM type 2 (diabetes mellitus, type 2)   . Chronic kidney disease (CKD), stage III (moderate)   . Iron deficiency anemia   . Morbid obesity   . Hyperkalemia   . Hypothyroidism   . CHF (congestive heart failure)      Past Surgical History: Past Surgical History  Procedure Date  . Portacath placement   . Cystoscopy w/ ureteral stent placement 05/28/2012    Procedure: CYSTOSCOPY WITH RETROGRADE PYELOGRAM/URETERAL STENT PLACEMENT;  Surgeon: Reece Packer,  MD;  Location: [facility];  Service: Urology;  Laterality: Left;     Family History: Family History  Problem Relation Age of Onset  . Coronary artery disease Mother   . Hypertension Mother   . Diabetes type II Mother   . Malignant hyperthermia Mother   . Coronary artery disease Father   . Hypertension Father   . Malignant hyperthermia Father   . Cancer Maternal Grandfather     ? Type     Social History: History   Social History  . Marital Status: Married    Spouse Name: N/A    Number of Children: N/A  . Years of Education: N/A   Occupational History  . Not on file.   Social History Main Topics  . Smoking status: Never Smoker   . Smokeless tobacco: Never Used  . Alcohol Use: No  . Drug Use: No  . Sexually Active: No   Other Topics Concern  . Not on file   Social History Narrative  . No narrative  on file     Review of Systems: As per HPI  Vital Signs: Blood pressure 113/55, pulse 73, temperature 98.1 F (36.7 C), temperature source Oral, resp. rate 18, height 5\' 6"  (1.676 m), weight 369 lb (167.377 kg), SpO2 98.00%.  Weight trends: Filed Weights   08/18/12 2126 08/19/12 2205 08/20/12 2113  Weight: 360 lb 14.3 oz (163.7 kg) 365 lb 4.8 oz (165.7 kg) 369 lb (167.377 kg)    Physical Exam: General: Vital signs reviewed and noted. Well-developed, well-nourished, in no acute distress; alert, appropriate and cooperative throughout examination.  Head: Normocephalic, atraumatic.  Eyes: PERRL, EOMI, No signs of anemia or jaundince.  Nose: Mucous membranes moist, not inflammed, nonerythematous.  Throat: Oropharynx nonerythematous, no exudate appreciated.   Neck: No deformities, masses, or tenderness noted.Supple, No carotid Bruits, no JVD.  Lungs:  Normal respiratory effort. Clear to auscultation BL without crackles or wheezes.  Heart: RRR. S1 and S2 normal without gallop, murmur, or rubs.  Abdomen:  BS normoactive. Soft, Nondistended, non-tender.  No masses or  organomegaly.  Extremities: No pretibial edema.  Neurologic: A&O X3, CN II - XII are grossly intact. Motor strength is 5/5 in the all 4 extremities, Sensations intact to light touch, Cerebellar signs negative.  Skin: No visible rashes, scars.    Lab results: Basic Metabolic Panel:  Lab [id number] 08/19/12 0624 08/18/12 0522  NA 137 134* 132*  K 5.5* 5.4* 5.3*  CL 103 100 99  CO2 21 21 18*  GLUCOSE 97 107* 93  BUN 79* 83* 81*  CREATININE 4.75* 5.79* 6.20*  CALCIUM 8.1* 8.2* 8.0*  MG -- -- --  PHOS -- -- --    Liver Function Tests: No results found for this basename: AST:3,ALT:3,ALKPHOS:3,BILITOT:3,PROT:3,ALBUMIN:3 in the last 168 hours No results found for this basename: LIPASE:3,AMYLASE:3 in the last 168 hours No results found for this basename: AMMONIA:3 in the last 168 hours  CBC:  Lab 08/21/12 0715 08/19/12 0624 08/18/12 0522 08/17/12 0815 08/16/12 1830  WBC 15.2* 15.6* 15.9* -- --  NEUTROABS -- -- -- -- 12.6*  HGB 7.9* 8.3* 8.2* -- --  HCT 26.3* 27.4* 26.8* -- --  MCV 89.8 88.4 89.0 88.3 89.1  PLT 363 332 339 -- --    Cardiac Enzymes: No results found for this basename: CKTOTAL:5,CKMB:5,CKMBINDEX:5,TROPONINI:5 in the last 168 hours  BNP: No components found with this basename: POCBNP:3  CBG:  Lab 08/21/12 0745 08/20/12 2117 08/20/12 1649 08/20/12 1153 08/20/12 0720  GLUCAP 94 132* 101* 85 86    Microbiology: Results for orders placed during the hospital encounter of 08/16/12  CULTURE, BLOOD (ROUTINE X 2)     Status: Normal (Preliminary result)   Collection Time   08/18/12  4:05 PM      Component Value Range Status Comment   Specimen Description BLOOD HAND LEFT   Final    Special Requests BOTTLES DRAWN AEROBIC AND ANAEROBIC 10CC   Final    Culture  Setup Time 08/18/2012 20:13   Final    Culture     Final    Value:        BLOOD CULTURE RECEIVED NO GROWTH TO DATE CULTURE WILL BE HELD FOR 5 DAYS BEFORE ISSUING A FINAL NEGATIVE REPORT   Report Status  PENDING   Incomplete   CULTURE, BLOOD (ROUTINE X 2)     Status: Normal (Preliminary result)   Collection Time   08/18/12  4:10 PM      Component Value Range Status Comment  Specimen Description BLOOD HAND RIGHT   Final    Special Requests BOTTLES DRAWN AEROBIC AND ANAEROBIC 10CC   Final    Culture  Setup Time 08/18/2012 20:13   Final    Culture     Final    Value:        BLOOD CULTURE RECEIVED NO GROWTH TO DATE CULTURE WILL BE HELD FOR 5 DAYS BEFORE ISSUING A FINAL NEGATIVE REPORT   Report Status PENDING   Incomplete   URINE CULTURE     Status: Normal   Collection Time   08/19/12  1:01 PM      Component Value Range Status Comment   Specimen Description URINE, CATHETERIZED   Final    Special Requests NONE   Final    Culture  Setup Time 08/19/2012 13:47   Final    Colony Count 4,000 COLONIES/ML   Final    Culture INSIGNIFICANT GROWTH   Final    Report Status 08/21/2012 FINAL   Final     Coagulation Studies: No results found for this basename: LABPROT:3,INR:3 in the last 72 hours  Urinalysis: No results found for this basename: COLORURINE:2,APPERANCEUR:2,LABSPEC:2,PHURINE:2,GLUCOSEU:2,HGBUR:2,BILIRUBINUR:2,KETONESUR:2,PROTEINUR:2,UROBILINOGEN:2,NITRITE:2,LEUKOCYTESUR:2 in the last 72 hours    Imaging:  No results found.   Assessment & Plan: 51 year old F with CKD stage V, CHF (EF 35-40%), morbid obesity, and hydradenitis suppurativa who presented with anemia and worsening renal function.   ESRD: The patient see Dr. Florene Glen at [facility]. She stage a GFR < 15 at baseline. Previous work up showed normal C3, C4, and ANA. She does not have dialysis access. Mechanism of injury likely combination of pre-renal and intrinsic renal. Good UOP so no obstructive pattern.  - Increase Lasix to 160 mg PO BID to decreased fluid as patient up approx 5 kg since admission - Con Renvela for hyperphosphatemia   - Check PTH - Patient will likely benefit from VVS consultation as she is likely to need  hemodialysis in the near future  Anemia, Normocytic: Likely multifactorial from blood loss and Fe deficiency. Fe 15 and saturation < 7 in May with active bleeding for months.  - Check Fe panel  - likely to benefit from starting aranesp  Dyspnea: This is likley multifactorial including vol overload from a combination of CHF and ESRD as well as obesity.  - Follow response to furosemide  Hydradenitis: management per ID with need for extensive surgery at [facility]  Thank you for this interesting consultation.   Chyrl Civatte, MD I have seen and examined this patient and agree with the plan of care seen and eval.  Will address vol with higher doses Furosemide.  Also add Na bicarb for RTA.   Will check Fe stores, and add Aranesp.  Need to check PTH also.  Bigger issue is total denial of prob.  Not a good candidate for ESRD mgmt.  Will cont to address status and educate.  Needs perm access. .  Cardell Rachel L 08/21/2012, 2:49 PM

## 2012-08-22 LAB — RENAL FUNCTION PANEL
Albumin: 2 g/dL — ABNORMAL LOW (ref 3.5–5.2)
Calcium: 8.5 mg/dL (ref 8.4–10.5)
Creatinine, Ser: 4.4 mg/dL — ABNORMAL HIGH (ref 0.50–1.10)
GFR calc Af Amer: 12 mL/min — ABNORMAL LOW (ref >90)
GFR calc non Af Amer: 11 mL/min — ABNORMAL LOW (ref >90)
Sodium: 140 mEq/L (ref 135–145)

## 2012-08-22 LAB — FERRITIN: Ferritin: 321 ng/mL — ABNORMAL HIGH (ref 10–291)

## 2012-08-22 LAB — GLUCOSE, CAPILLARY: Glucose-Capillary: 133 mg/dL — ABNORMAL HIGH (ref 70–99)

## 2012-08-22 LAB — FOLATE: Folate: 10.2 ng/mL

## 2012-08-22 LAB — PARATHYROID HORMONE, INTACT (NO CA): PTH: 424.3 pg/mL — ABNORMAL HIGH (ref 14.0–72.0)

## 2012-08-22 MED ORDER — CLINDAMYCIN HCL 300 MG PO CAPS
300.0000 mg | ORAL_CAPSULE | Freq: Three times a day (TID) | ORAL | Status: DC
  Administered 2012-08-22 – 2012-08-23 (×3): 300 mg via ORAL
  Filled 2012-08-22 (×6): qty 1

## 2012-08-22 MED ORDER — CLINDAMYCIN HCL 300 MG PO CAPS: 900.0000 mg | ORAL_CAPSULE | Freq: Three times a day (TID) | ORAL | Status: DC

## 2012-08-22 MED ORDER — SACCHAROMYCES BOULARDII 250 MG PO CAPS
250.0000 mg | ORAL_CAPSULE | Freq: Two times a day (BID) | ORAL | Status: DC
  Administered 2012-08-22 – 2012-08-23 (×2): 250 mg via ORAL
  Filled 2012-08-22 (×5): qty 1

## 2012-08-22 MED ORDER — SODIUM CHLORIDE 0.9 % IV SOLN
1020.0000 mg | Freq: Once | INTRAVENOUS | Status: AC
  Administered 2012-08-22: 1020 mg via INTRAVENOUS
  Filled 2012-08-22 (×2): qty 34

## 2012-08-22 NOTE — Progress Notes (Signed)
[facility] Progress Note    Subjective:   No complaints this AM   Objective:   BP 128/46  Pulse 78  Temp 98 F (36.7 C) (Oral)  Resp 18  Ht 5\' 6"  (1.676 m)  Wt 368 lb 9.6 oz (167.196 kg)  BMI 59.49 kg/m2  SpO2 98%  Intake/Output Summary (Last 24 hours) at 08/22/12 1149 Last data filed at 08/22/12 1115  Gross per 24 hour  Intake    410 ml  Output   4001 ml  Net  -3591 ml   Weight change: -6.4 oz (-0.181 kg)  Physical Exam:  General:  Vital signs reviewed and noted. Well-developed, well-nourished, in no acute distress; alert, appropriate and cooperative throughout examination.   Head:  Normocephalic, atraumatic.   Eyes:  PERRL, EOMI, No signs of anemia or jaundince.   Nose:  Mucous membranes moist, not inflammed, nonerythematous.   Throat:  Oropharynx nonerythematous, no exudate appreciated.   Neck:  No deformities, masses, or tenderness noted.Supple, No carotid Bruits, no JVD.   Lungs:  Normal respiratory effort. Clear to auscultation BL without crackles or wheezes.   Heart:  RRR. S1 and S2 normal without gallop, murmur, or rubs.   Abdomen:  BS normoactive. Soft, Nondistended, non-tender. No masses or organomegaly.   Extremities:  No pretibial edema.   Neurologic:  A&O X3, CN II - XII are grossly intact. Motor strength is 5/5 in the all 4 extremities, Sensations intact to light touch, Cerebellar signs negative.   Skin:  No visible rashes, scars.      Imaging: US Renal  08/22/2012  *RADIOLOGY REPORT*  Clinical Data: Acute renal failure superimposed on chronic kidney disease.  Diabetes and hypertension. Urolithiasis and recent stent placement for left ureteral calculus.  RENAL/URINARY TRACT ULTRASOUND COMPLETE  Comparison:  05/21/2012  Findings:  Right Kidney:  Measures 12.4 cm length.  Increased parenchymal echogenicity again seen, consistent with medical renal disease.  No evidence of renal mass or hydronephrosis.  Left Kidney:  Measures 11.3 cm length.   Increased renal parenchymal echogenicity again seen, consistent with medical renal disease.  No evidence of renal mass.  No evidence of hydronephrosis.  The previously seen left-sided hydronephrosis have resolved.  The  Bladder:  Unremarkable appearance for degree of bladder filling.  Other:  Moderate ascites seen within the abdomen and pelvis.  IMPRESSION:  1.  No evidence of hydronephrosis.  Interval resolution of left hydronephrosis since prior study. 2.  Chronic medical renal disease. 3.  Moderate ascites.   Original Report Authenticated By: Marlaine Hind, M.D.     Labs: BMET  Lab 08/22/12 0630 08/21/12 0715 08/19/12 [id number] 08/18/12 0522 08/17/12 0815 08/16/12 1830  NA 140 137 134* 132* 129* 132*  K 5.3* 5.5* 5.4* 5.3* 5.3* 5.1  CL 105 103 100 99 98 98  CO2 22 21 21  18* 17* 20  GLUCOSE 95 97 107* 93 126* 165*  BUN 76* 79* 83* 81* 79* 75*  CREATININE 4.40* 4.75* 5.79* 6.20* 6.20* 6.01*  ALB -- -- -- -- -- --  CALCIUM 8.5 8.1* 8.2* 8.0* 7.7* 8.2*  PHOS 6.3* -- -- -- -- --   CBC  Lab 08/21/12 0715 08/19/12 0624 08/18/12 0522 08/17/12 0815 08/16/12 1830  WBC 15.2* 15.6* 15.9* 15.9* --  NEUTROABS -- -- -- -- 12.6*  HGB 7.9* 8.3* 8.2* 7.7* --  HCT 26.3* 27.4* 26.8* 24.8* --  MCV 89.8 88.4 89.0 88.3 --  PLT 363 332 339 288 --  Medications:      . aspirin EC  81 mg Oral Daily  . carvedilol  3.125 mg Oral BID WC  . clindamycin  300 mg Oral Q8H  . darbepoetin (ARANESP) injection - NON-DIALYSIS  200 mcg Subcutaneous Q Wed-1800  . DULoxetine  60 mg Oral Daily  . folic acid  1 mg Oral Daily  . furosemide  160 mg Oral BID  . heparin  5,000 Units Subcutaneous Q8H  .  HYDROmorphone (DILAUDID) injection  1 mg Intravenous Once  . insulin aspart  0-20 Units Subcutaneous TID WC  . insulin aspart  0-5 Units Subcutaneous QHS  . insulin aspart  6 Units Subcutaneous TID WC  . levothyroxine  50 mcg Oral QAC breakfast  . saccharomyces boulardii  250 mg Oral BID  . sevelamer  2,400 mg Oral TID  WC  . simvastatin  20 mg Oral q1800  . sodium bicarbonate  1,300 mg Oral BID  . sodium bicarbonate  1,300 mg Oral TID  . sodium chloride  3 mL Intravenous Q12H  . zinc sulfate  220 mg Oral Daily  . DISCONTD: carvedilol  6.25 mg Oral BID WC  . DISCONTD: clindamycin  900 mg Oral Q8H  . DISCONTD: clindamycin (CLEOCIN) IV  900 mg Intravenous Q8H  . DISCONTD: ferrous sulfate  325 mg Oral BID AC  . DISCONTD: furosemide  60 mg Oral BID  . DISCONTD: magnesium oxide  400 mg Oral BID     Assessment/ Plan:   51 year old F with CKD stage V, CHF (EF 35-40%), morbid obesity, and hydradenitis suppurativa who presented with anemia and worsening renal function.   Stage V CKD: The patient see Dr. Florene Glen at [facility] She stage a GFR < 15 at baseline. Previous work up showed normal C3, C4, and ANA. She does not have dialysis access. Had obstructive uropathy in June and renal function has not recovered.  - Patient with good diuresis and no acute problems, so this can be managed as an outpatient.  - At d/c please continue Lasix 160 mg PO BID; conitnue Renvela for hyperphosphatemia; continue NaHCO3; Hold mag ox at d/c - Patient will need to see Dr. Florene Glen within 2 weeks for follow-up - Inpatient renal team will sign off, please contact us if you have any questions.  Anemia, Normocytic: Likely multifactorial from blood loss and Fe deficiency and CKD. Fe 15 and saturation < 7 in May with active bleeding for months. Fe 28 with 10% sat.  - on Aranesp - I will order Feraheme today  Dyspnea: This is likley multifactorial including vol. overload from a combination of CHF and ESRD as well as obesity hypoventilations syndrome.  - Still dyspneic while sitting, but most likely related to obesity    Hydradenitis: management per ID with need surgery at [facility].    Thank you for this interesting consultation.    Chyrl Civatte, MD    08/22/2012, 11:49 AM   I have seen and examined this patient and agree with the plan  of care seen and eval.  Will cont diuretics. Anemia addressed . Renal function stable . No evidence obstruction.  PTH Pending .  Ermie Glendenning L 08/22/2012, 12:44 PM

## 2012-08-22 NOTE — Progress Notes (Addendum)
[facility]             Progress Note   Subjective: Doing ok, breathing starting to get a little better  subjective: Vital signs in last 24 hours: Temp:  [97.7 F (36.5 C)-98.5 F (36.9 C)] 98 F (36.7 C) (09/26 0956) Pulse Rate:  [69-78] 78  (09/26 0956) Resp:  [18] 18  (09/26 0956) BP: (111-136)/(38-61) 128/46 mmHg (09/26 0956) SpO2:  [93 %-98 %] 98 % (09/26 0956) Weight:  [167.196 kg (368 lb 9.6 oz)] 167.196 kg (368 lb 9.6 oz) (09/25 2133) Weight change: -0.181 kg (-6.4 oz) Last BM Date: 08/20/12  Intake/Output from previous day: 09/25 0701 - 09/26 0700 In: 410 [P.O.:360; IV Piggyback:50] Out: 3250 [Urine:3250] Total I/O In: 120 [P.O.:120] Out: 750 [Urine:750]   Physical Exam: General: Alert, awake, oriented x3, morbidly obese., no distress HEENT: No bruits, no goiter. Heart: Regular rate and rhythm, without murmurs, rubs, gallops. Lungs: Mild basilar rhonchi/crackles; otherwise clear to auscultation bilaterally. Abdomen: Obese, Soft, nontender, nondistended, positive bowel sounds. Extremities: No clubbing, cyanosis, trace edema with positive pedal pulses. Neuro: Grossly intact, nonfocal. She has not been ambulated. Skin: Has multiple areas of active hydradenitis under her breasts, pannus and worse in her groin/bottom area. They are all purulent and foul-smelling.   Lab Results: Basic Metabolic Panel:  Basename 08/22/12 0630 08/21/12 0715  NA 140 137  K 5.3* 5.5*  CL 105 103  CO2 22 21  GLUCOSE 95 97  BUN 76* 79*  CREATININE 4.40* 4.75*  CALCIUM 8.5 8.1*  MG -- --  PHOS 6.3* --   CBC:  Basename 08/21/12 0715  WBC 15.2*  NEUTROABS --  HGB 7.9*  HCT 26.3*  MCV 89.8  PLT 363   CBG:  Basename 08/22/12 0747 08/21/12 2137 08/21/12 1737 08/21/12 1258 08/21/12 0745 08/20/12 2117  GLUCAP 86 116* 96 83 94 132*   Studies/Results: US Renal  08/22/2012  *RADIOLOGY REPORT*  Clinical Data: Acute renal failure superimposed on chronic kidney  disease.  Diabetes and hypertension. Urolithiasis and recent stent placement for left ureteral calculus.  RENAL/URINARY TRACT ULTRASOUND COMPLETE  Comparison:  05/21/2012  Findings:  Right Kidney:  Measures 12.4 cm length.  Increased parenchymal echogenicity again seen, consistent with medical renal disease.  No evidence of renal mass or hydronephrosis.  Left Kidney:  Measures 11.3 cm length.  Increased renal parenchymal echogenicity again seen, consistent with medical renal disease.  No evidence of renal mass.  No evidence of hydronephrosis.  The previously seen left-sided hydronephrosis have resolved.  The  Bladder:  Unremarkable appearance for degree of bladder filling.  Other:  Moderate ascites seen within the abdomen and pelvis.  IMPRESSION:  1.  No evidence of hydronephrosis.  Interval resolution of left hydronephrosis since prior study. 2.  Chronic medical renal disease. 3.  Moderate ascites.   Original Report Authenticated By: Marlaine Hind, M.D.     Medications: Scheduled Meds:    . aspirin EC  81 mg Oral Daily  . carvedilol  3.125 mg Oral BID WC  . clindamycin (CLEOCIN) IV  900 mg Intravenous Q8H  . darbepoetin (ARANESP) injection - NON-DIALYSIS  200 mcg Subcutaneous Q Wed-1800  . DULoxetine  60 mg Oral Daily  . folic acid  1 mg Oral Daily  . furosemide  160 mg Oral BID  . heparin  5,000 Units Subcutaneous Q8H  .  HYDROmorphone (DILAUDID) injection  1 mg Intravenous Once  . insulin aspart  0-20 Units Subcutaneous TID WC  .  insulin aspart  0-5 Units Subcutaneous QHS  . insulin aspart  6 Units Subcutaneous TID WC  . levothyroxine  50 mcg Oral QAC breakfast  . magnesium oxide  400 mg Oral BID  . sevelamer  2,400 mg Oral TID WC  . simvastatin  20 mg Oral q1800  . sodium bicarbonate  1,300 mg Oral BID  . sodium bicarbonate  1,300 mg Oral TID  . sodium chloride  3 mL Intravenous Q12H  . zinc sulfate  220 mg Oral Daily  . DISCONTD: carvedilol  6.25 mg Oral BID WC  . DISCONTD: ferrous  sulfate  325 mg Oral BID AC  . DISCONTD: furosemide  60 mg Oral BID   Continuous Infusions:  PRN Meds:.sodium chloride, cyclobenzaprine, HYDROmorphone (DILAUDID) injection, oxyCODONE-acetaminophen, sodium chloride  Assessment/Plan:  Principal Problem:  *Hidradenitis suppurativa Active Problems:  Morbid obesity  Chronic kidney disease (CKD), stage IV-V  Anemia  ARF (acute renal failure)  Sepsis associated hypotension Nephrolithiasis  Early Sepsis with hypotension -BP continue to be stable and patient afebrile now. -resume coreg, hold imdur, hydralazine. -2/2 severe hydradenitis suppurativa and antihypertensive drugs.   Hydradenitis Suppurative -after discussing pros and cons one more time of ongoing infection and needs for surgery; she discussed with in-house surgery service and final decision is to go to [facility] for further evaluation and treatment (patient will require extensive debridement and even skin grafts; not offered by surgeon here). -Will continue clindamycin by mouth and also topically (last one to be apply as an outpatient 4-6 times a day after good skin hygiene of her open wounds).; suppressive therapy recommended by ID -Add floraster  Acute on CKD Stage IV-V with volume overload/Hyperkalemia -Baseline Cr around 5. -Follows with Dr. Florene Glen as an outpatient. -Renal function now returning back to baseline of 5 -K has been in 5.4 to 5.5 range, recived kayexalate x1 9/25, Renal diet - Appreciate Dr.Deterding's input Diuresing well on current dose of lasix -Renal USG without hydronephrosis   Hypothyroidism -TSH high. -Continue synthroid at 50 mcg; will need TSH and free T 4 recheck in 4-6 weeks.  Anemia -Likely AOCD 2/2 CKD and Fe defi -Has received 2 u PRBCs. -No signs of active bleeding. - Hb today 7.9 -will monitor  Nephrolithiasis and R ureteral stone with stent -was unable to have  CT urogram due to dyspnea, declined CT on 2 occasions -USG yesterday  without residual hydronephrosis       LOS: 6 days   [facility] Pager: [phone] 08/22/2012, 10:18 AM

## 2012-08-23 LAB — BASIC METABOLIC PANEL
Chloride: 104 mEq/L (ref 96–112)
GFR calc Af Amer: 15 mL/min — ABNORMAL LOW (ref >90)
Potassium: 5.2 mEq/L — ABNORMAL HIGH (ref 3.5–5.1)
Sodium: 139 mEq/L (ref 135–145)

## 2012-08-23 LAB — GLUCOSE, CAPILLARY
Glucose-Capillary: 90 mg/dL (ref 70–99)
Glucose-Capillary: 168 mg/dL — ABNORMAL HIGH (ref 70–99)

## 2012-08-23 MED ORDER — SODIUM BICARBONATE 650 MG PO TABS: 1300.0000 mg | ORAL_TABLET | Freq: Three times a day (TID) | ORAL | Status: DC

## 2012-08-23 MED ORDER — FUROSEMIDE 80 MG PO TABS: 160.0000 mg | ORAL_TABLET | Freq: Two times a day (BID) | ORAL | Status: DC

## 2012-08-23 MED ORDER — SACCHAROMYCES BOULARDII 250 MG PO CAPS: 250.0000 mg | ORAL_CAPSULE | Freq: Two times a day (BID) | ORAL | Status: DC

## 2012-08-23 MED ORDER — CLINDAMYCIN HCL 300 MG PO CAPS: 300.0000 mg | ORAL_CAPSULE | Freq: Three times a day (TID) | ORAL | Status: DC

## 2012-08-23 MED ORDER — SEVELAMER CARBONATE 800 MG PO TABS: 2400.0000 mg | ORAL_TABLET | Freq: Three times a day (TID) | ORAL | Status: DC

## 2012-08-23 NOTE — Plan of Care (Signed)
Problem: Food- and Nutrition-Related Knowledge Deficit (NB-1.1) Goal: Nutrition education Formal process to instruct or train a patient/client in a skill or to impart knowledge to help patients/clients voluntarily manage or modify food choices and eating behavior to maintain or improve health.  Outcome: Completed/Met Date Met:  08/23/12  Nutrition Education Note  RD consulted for Renal Education. Provided Choose-A-Meal Booklet to patient. Provided written recommended serving sizes specifically determined for patient's current nutritional status. Pt reports that she does not understand renal diet restrictions but refuses education at this time. This RD offered to discuss why diet restrictions are needed and what the restrictions encompass however pt adamantly refused education.   Pt reports that all she received for breakfast this morning was toast. Discussed other options that are available to her (boiled eggs, grits, fruit, etc.) however pt remains unsatisfied. Discussed possible diet liberalization with Dr. Broadus John and explained pt's current issues with charge RN.  Body mass index is 59.17 kg/(m^2). Pt meets criteria for Morbid Obesity Class III based on current BMI.  Current diet order is Renal, patient is consuming approximately 75% of meals at this time. Labs and medications reviewed. No further nutrition interventions warranted at this time. RD contact information provided. If additional nutrition issues arise, please re-consult RD.  Inda Coke MS, RD, LDN Pager: [phone] After-hours pager: [phone]

## 2012-08-23 NOTE — Progress Notes (Signed)
Pt discharged to home after visit summary reviewed and pt capable of re verbalizing medications and follow up appointments. Pt remains stable. No signs and symptoms of distress. Educated to return to ER in the event of SOB, dizziness, chest pain, or fainting. Fain Francis, RN   

## 2012-08-24 LAB — CULTURE, BLOOD (ROUTINE X 2): Culture: NO GROWTH

## 2012-09-05 NOTE — Discharge Summary (Addendum)
Physician Discharge Summary  Patient ID: [name] MRN: [id number] DOB/AGE: 02/07/1961 51 y.o.  Admit date: 08/16/2012 Discharge date: 08/23/2012  Primary Care Physician:  [name], MD  Nephrologist: Dr.Powell  Discharge Diagnoses:    Principal Problem:  *Hidradenitis suppurativa  Anemia of chronic disease  Progressive CKD 4-5 Volume overload Hyperkalemia Denial about medical illness Non compliance  Morbid obesity  Anemia  ARF (acute renal failure)  Sepsis associated hypotension  Nephrolithiasis  H/o R ureteral stent     Medication List     As of 09/05/2012  2:34 PM    STOP taking these medications         hydrALAZINE 25 MG tablet   Commonly known as: APRESOLINE      potassium chloride SA 20 MEQ tablet   Commonly known as: K-DUR,KLOR-CON      TAKE these medications         aspirin 81 MG EC tablet   Take 1 tablet (81 mg total) by mouth daily.      carvedilol 6.25 MG tablet   Commonly known as: COREG   Take 6.25 mg by mouth 2 (two) times daily with a meal.      clindamycin 300 MG capsule   Commonly known as: CLEOCIN   Take 1 capsule (300 mg total) by mouth every 8 (eight) hours. [facility]      cyclobenzaprine 10 MG tablet   Commonly known as: FLEXERIL   Take 10 mg by mouth 3 (three) times daily as needed. For Spasms      CYMBALTA 60 MG capsule   Generic drug: DULoxetine   Take 60 mg by mouth daily.      ferrous sulfate 325 (65 FE) MG tablet   Take 325 mg by mouth 2 (two) times daily.      folic acid 1 MG tablet   Commonly known as: FOLVITE   Take 1 mg by mouth daily.      furosemide 80 MG tablet   Commonly known as: LASIX   Take 2 tablets (160 mg total) by mouth 2 (two) times daily.      isosorbide mononitrate 30 MG 24 hr tablet   Commonly known as: IMDUR   Take 30 mg by mouth daily.      levothyroxine 25 MCG tablet   Commonly known as: SYNTHROID, LEVOTHROID   Take 25 mcg by mouth daily.      magnesium oxide 400  MG tablet   Commonly known as: MAG-OX   Take 400 mg by mouth 2 (two) times daily.      oxyCODONE-acetaminophen 5-325 MG per tablet   Commonly known as: PERCOCET/ROXICET   Take 1 tablet by mouth every 6 (six) hours as needed. For pain      pravastatin 40 MG tablet   Commonly known as: PRAVACHOL   Take 40 mg by mouth daily.      saccharomyces boulardii 250 MG capsule   Commonly known as: FLORASTOR   Take 1 capsule (250 mg total) by mouth 2 (two) times daily.      sevelamer 800 MG tablet   Commonly known as: RENVELA   Take 3 tablets (2,400 mg total) by mouth 3 (three) times daily with meals.      sodium bicarbonate 650 MG tablet   Take 2 tablets (1,300 mg total) by mouth 3 (three) times daily.      zinc sulfate 220 MG capsule   Take 220 mg by mouth daily.  Disposition and Follow-up:  Dr.Powell in 2 weeks  Consults: Urology Dr.Macdiarmid Renal Dr.Deterding Infectious disease  Significant Diagnostic Studies:  No results found.  Brief H and P: [name] is an 51 y.o. female Morbidly obese, with chronic panus and hx of supurative hyadenitis, s/p I and D, and prior antibiotics, seen in surgical consultation at [facility], but has not been able to get surgery done because of cost, hx of CKD and resulting anemia, steadily having decreasing Hb, presents to the ER at the recommendation of her PCP for Hb of 6 g/DL. She did have an increase in the drainage of her chronic infection with increase foul smelling oder. She did not have fever, chills, chest pain or shortness of breath.    Hospital Course:   Early Sepsis with hypotension  -BP continue to be stable and patient afebrile now.  -resume coreg, hold imdur, hydralazine.  -2/2 severe hydradenitis suppurativa and antihypertensive drugs.   Hydradenitis Suppurative  -after discussing pros and cons one more time of ongoing infection and needs for surgery; she discussed with in-house surgery service and final decision is to  go to [facility] for further evaluation and treatment (patient will require extensive debridement and even skin grafts; not offered by surgeon here).  -Will continue clindamycin by mouth and also topically (last one to be apply as an outpatient 4-6 times a day after good skin hygiene of her open wounds).; suppressive therapy recommended by ID  -Add floraster   Acute on CKD Stage IV-V with volume overload/Hyperkalemia  -Baseline Cr around 5.  -Follows with Dr. Florene Glen as an outpatient.  -Renal function now returning back to baseline of 5  -K has been in 5.4 to 5.5 range, recived kayexalate x1 9/25, Renal diet  - Appreciate Dr.Deterding's input  Diuresing well on current dose of lasix  -Renal USG without hydronephrosis  Unfortunately patient is in complete denial about the severity of her CKD and the importance of being on a low potassium diet despite multiple discussions   Hypothyroidism  -TSH high.  -Continue synthroid at 50 mcg; will need TSH and free T 4 recheck in 4-6 weeks.   Anemia  -Likely AOCD 2/2 CKD and Fe defi  -Has received 2 u PRBCs.  -No signs of active bleeding.  - Hb today 7.9  -- also received Iv Iron while  in house  Nephrolithiasis and R ureteral stone with stent  -was unable to have CT urogram due to dyspnea, declined CT on 2 occasions  -USG yesterday without residual hydronephrosis      Time spent on Discharge: 46min  Signed: Audi Wettstein [facility] Pager: [phone] 09/05/2012, 2:34 PM

## 2012-09-06 ENCOUNTER — Other Ambulatory Visit (HOSPITAL_COMMUNITY): Payer: Self-pay | Admitting: Internal Medicine

## 2012-10-27 DIAGNOSIS — Z8674 Personal history of sudden cardiac arrest: Secondary | ICD-10-CM

## 2012-10-27 DIAGNOSIS — H9190 Unspecified hearing loss, unspecified ear: Secondary | ICD-10-CM

## 2012-10-27 DIAGNOSIS — Z9289 Personal history of other medical treatment: Secondary | ICD-10-CM

## 2012-10-27 HISTORY — DX: Personal history of sudden cardiac arrest: Z86.74

## 2012-10-27 HISTORY — DX: Personal history of other medical treatment: Z92.89

## 2012-10-27 HISTORY — DX: Unspecified hearing loss, unspecified ear: H91.90

## 2012-11-10 ENCOUNTER — Emergency Department (HOSPITAL_COMMUNITY): Payer: BC Managed Care – PPO

## 2012-11-10 ENCOUNTER — Inpatient Hospital Stay (HOSPITAL_COMMUNITY)
Admission: EM | Admit: 2012-11-10 | Discharge: 2012-12-03 | DRG: 584 | Disposition: A | Discharge status: Home / Self Care | Payer: BC Managed Care – PPO | Attending: Internal Medicine | Admitting: Internal Medicine

## 2012-11-10 ENCOUNTER — Encounter (HOSPITAL_COMMUNITY): Payer: Self-pay

## 2012-11-10 DIAGNOSIS — N1330 Unspecified hydronephrosis: Secondary | ICD-10-CM

## 2012-11-10 DIAGNOSIS — Z515 Encounter for palliative care: Secondary | ICD-10-CM

## 2012-11-10 DIAGNOSIS — R6520 Severe sepsis without septic shock: Secondary | ICD-10-CM | POA: Diagnosis present

## 2012-11-10 DIAGNOSIS — E042 Nontoxic multinodular goiter: Secondary | ICD-10-CM

## 2012-11-10 DIAGNOSIS — A419 Sepsis, unspecified organism: Secondary | ICD-10-CM | POA: Diagnosis present

## 2012-11-10 DIAGNOSIS — K219 Gastro-esophageal reflux disease without esophagitis: Secondary | ICD-10-CM | POA: Diagnosis present

## 2012-11-10 DIAGNOSIS — M109 Gout, unspecified: Secondary | ICD-10-CM | POA: Diagnosis present

## 2012-11-10 DIAGNOSIS — A4151 Sepsis due to Escherichia coli [E. coli]: Principal | ICD-10-CM | POA: Diagnosis present

## 2012-11-10 DIAGNOSIS — J9819 Other pulmonary collapse: Secondary | ICD-10-CM | POA: Diagnosis not present

## 2012-11-10 DIAGNOSIS — E871 Hypo-osmolality and hyponatremia: Secondary | ICD-10-CM | POA: Diagnosis present

## 2012-11-10 DIAGNOSIS — R1310 Dysphagia, unspecified: Secondary | ICD-10-CM | POA: Diagnosis not present

## 2012-11-10 DIAGNOSIS — N183 Chronic kidney disease, stage 3 unspecified: Secondary | ICD-10-CM | POA: Diagnosis present

## 2012-11-10 DIAGNOSIS — Z7982 Long term (current) use of aspirin: Secondary | ICD-10-CM

## 2012-11-10 DIAGNOSIS — F39 Unspecified mood [affective] disorder: Secondary | ICD-10-CM | POA: Diagnosis not present

## 2012-11-10 DIAGNOSIS — I509 Heart failure, unspecified: Secondary | ICD-10-CM | POA: Diagnosis present

## 2012-11-10 DIAGNOSIS — R5381 Other malaise: Secondary | ICD-10-CM | POA: Diagnosis not present

## 2012-11-10 DIAGNOSIS — N179 Acute kidney failure, unspecified: Secondary | ICD-10-CM | POA: Diagnosis present

## 2012-11-10 DIAGNOSIS — E039 Hypothyroidism, unspecified: Secondary | ICD-10-CM | POA: Diagnosis present

## 2012-11-10 DIAGNOSIS — G8929 Other chronic pain: Secondary | ICD-10-CM | POA: Diagnosis present

## 2012-11-10 DIAGNOSIS — D649 Anemia, unspecified: Secondary | ICD-10-CM | POA: Diagnosis present

## 2012-11-10 DIAGNOSIS — D509 Iron deficiency anemia, unspecified: Secondary | ICD-10-CM | POA: Diagnosis present

## 2012-11-10 DIAGNOSIS — J9811 Atelectasis: Secondary | ICD-10-CM | POA: Diagnosis present

## 2012-11-10 DIAGNOSIS — E875 Hyperkalemia: Secondary | ICD-10-CM | POA: Diagnosis present

## 2012-11-10 DIAGNOSIS — J189 Pneumonia, unspecified organism: Secondary | ICD-10-CM

## 2012-11-10 DIAGNOSIS — D638 Anemia in other chronic diseases classified elsewhere: Secondary | ICD-10-CM | POA: Diagnosis present

## 2012-11-10 DIAGNOSIS — E872 Acidosis, unspecified: Secondary | ICD-10-CM | POA: Diagnosis present

## 2012-11-10 DIAGNOSIS — Z79899 Other long term (current) drug therapy: Secondary | ICD-10-CM

## 2012-11-10 DIAGNOSIS — F0630 Mood disorder due to known physiological condition, unspecified: Secondary | ICD-10-CM | POA: Diagnosis present

## 2012-11-10 DIAGNOSIS — N2581 Secondary hyperparathyroidism of renal origin: Secondary | ICD-10-CM | POA: Diagnosis present

## 2012-11-10 DIAGNOSIS — N200 Calculus of kidney: Secondary | ICD-10-CM

## 2012-11-10 DIAGNOSIS — R112 Nausea with vomiting, unspecified: Secondary | ICD-10-CM | POA: Diagnosis not present

## 2012-11-10 DIAGNOSIS — J9600 Acute respiratory failure, unspecified whether with hypoxia or hypercapnia: Secondary | ICD-10-CM

## 2012-11-10 DIAGNOSIS — I129 Hypertensive chronic kidney disease with stage 1 through stage 4 chronic kidney disease, or unspecified chronic kidney disease: Secondary | ICD-10-CM | POA: Diagnosis present

## 2012-11-10 DIAGNOSIS — L88 Pyoderma gangrenosum: Secondary | ICD-10-CM

## 2012-11-10 DIAGNOSIS — G931 Anoxic brain damage, not elsewhere classified: Secondary | ICD-10-CM | POA: Diagnosis not present

## 2012-11-10 DIAGNOSIS — Z6841 Body Mass Index (BMI) 40.0 and over, adult: Secondary | ICD-10-CM

## 2012-11-10 DIAGNOSIS — E119 Type 2 diabetes mellitus without complications: Secondary | ICD-10-CM | POA: Diagnosis present

## 2012-11-10 DIAGNOSIS — I5020 Unspecified systolic (congestive) heart failure: Secondary | ICD-10-CM

## 2012-11-10 DIAGNOSIS — Z872 Personal history of diseases of the skin and subcutaneous tissue: Secondary | ICD-10-CM

## 2012-11-10 DIAGNOSIS — I469 Cardiac arrest, cause unspecified: Secondary | ICD-10-CM | POA: Diagnosis not present

## 2012-11-10 DIAGNOSIS — N390 Urinary tract infection, site not specified: Secondary | ICD-10-CM | POA: Diagnosis present

## 2012-11-10 DIAGNOSIS — L732 Hidradenitis suppurativa: Secondary | ICD-10-CM | POA: Diagnosis present

## 2012-11-10 DIAGNOSIS — B965 Pseudomonas (aeruginosa) (mallei) (pseudomallei) as the cause of diseases classified elsewhere: Secondary | ICD-10-CM

## 2012-11-10 DIAGNOSIS — H903 Sensorineural hearing loss, bilateral: Secondary | ICD-10-CM | POA: Diagnosis not present

## 2012-11-10 DIAGNOSIS — I10 Essential (primary) hypertension: Secondary | ICD-10-CM

## 2012-11-10 DIAGNOSIS — M79609 Pain in unspecified limb: Secondary | ICD-10-CM | POA: Diagnosis not present

## 2012-11-10 DIAGNOSIS — R209 Unspecified disturbances of skin sensation: Secondary | ICD-10-CM | POA: Diagnosis not present

## 2012-11-10 DIAGNOSIS — I5023 Acute on chronic systolic (congestive) heart failure: Secondary | ICD-10-CM | POA: Diagnosis present

## 2012-11-10 DIAGNOSIS — J9601 Acute respiratory failure with hypoxia: Secondary | ICD-10-CM | POA: Diagnosis present

## 2012-11-10 HISTORY — DX: Pneumonia, unspecified organism: J18.9

## 2012-11-10 LAB — CBC WITH DIFFERENTIAL/PLATELET
Basophils Absolute: 0 10*3/uL (ref 0.0–0.1)
Eosinophils Absolute: 0 10*3/uL (ref 0.0–0.7)
Lymphocytes Relative: 1 % — ABNORMAL LOW (ref 12–46)
MCH: 27.2 pg (ref 26.0–34.0)
MCHC: 29.5 g/dL — ABNORMAL LOW (ref 30.0–36.0)
Monocytes Absolute: 1 10*3/uL (ref 0.1–1.0)
Neutrophils Relative %: 96 % — ABNORMAL HIGH (ref 43–77)
Platelets: 311 10*3/uL (ref 150–400)
RDW: 17 % — ABNORMAL HIGH (ref 11.5–15.5)

## 2012-11-10 LAB — BASIC METABOLIC PANEL
Calcium: 7.6 mg/dL — ABNORMAL LOW (ref 8.4–10.5)
GFR calc Af Amer: 5 mL/min — ABNORMAL LOW (ref >90)
GFR calc non Af Amer: 5 mL/min — ABNORMAL LOW (ref >90)
Glucose, Bld: 137 mg/dL — ABNORMAL HIGH (ref 70–99)
Sodium: 129 mEq/L — ABNORMAL LOW (ref 135–145)

## 2012-11-10 LAB — TROPONIN I: Troponin I: <0.3 ng/mL (ref <0.30)

## 2012-11-10 LAB — URINALYSIS, ROUTINE W REFLEX MICROSCOPIC
Nitrite: NEGATIVE
Protein, ur: >300 mg/dL — AB
Urobilinogen, UA: 0.2 mg/dL (ref 0.0–1.0)

## 2012-11-10 LAB — URINE MICROSCOPIC-ADD ON

## 2012-11-10 LAB — POCT I-STAT 3, ART BLOOD GAS (G3+)
Acid-base deficit: 9 mmol/L — ABNORMAL HIGH (ref 0.0–2.0)
Acid-base deficit: 9 mmol/L — ABNORMAL HIGH (ref 0.0–2.0)
Bicarbonate: 18.6 mEq/L — ABNORMAL LOW (ref 20.0–24.0)
Patient temperature: 98.6
Patient temperature: 98.6
TCO2: 20 mmol/L (ref 0–100)

## 2012-11-10 LAB — PREPARE RBC (CROSSMATCH)

## 2012-11-10 LAB — CG4 I-STAT (LACTIC ACID): Lactic Acid, Venous: 2.53 mmol/L — ABNORMAL HIGH (ref 0.5–2.2)

## 2012-11-10 MED ORDER — PANTOPRAZOLE SODIUM 40 MG IV SOLR
40.0000 mg | Freq: Every day | INTRAVENOUS | Status: DC
  Administered 2012-11-11 – 2012-11-14 (×5): 40 mg via INTRAVENOUS
  Filled 2012-11-10 (×7): qty 40

## 2012-11-10 MED ORDER — VASOPRESSIN 20 UNIT/ML IJ SOLN
0.0300 [IU]/min | INTRAVENOUS | Status: DC
  Administered 2012-11-11: 0.03 [IU]/min via INTRAVENOUS
  Filled 2012-11-10: qty 2.5

## 2012-11-10 MED ORDER — NOREPINEPHRINE BITARTRATE 1 MG/ML IJ SOLN
2.0000 ug/min | INTRAVENOUS | Status: DC
  Administered 2012-11-11: 30 ug/min via INTRAVENOUS
  Administered 2012-11-11: 50 ug/min via INTRAVENOUS
  Filled 2012-11-10 (×3): qty 16

## 2012-11-10 MED ORDER — PIPERACILLIN-TAZOBACTAM 3.375 G IVPB
3.3750 g | Freq: Once | INTRAVENOUS | Status: AC
  Administered 2012-11-10: 3.375 g via INTRAVENOUS
  Filled 2012-11-10: qty 50

## 2012-11-10 MED ORDER — ROCURONIUM BROMIDE 50 MG/5ML IV SOLN
INTRAVENOUS | Status: AC | PRN
  Administered 2012-11-10: 100 mg via INTRAVENOUS

## 2012-11-10 MED ORDER — INSULIN ASPART 100 UNIT/ML ~~LOC~~ SOLN
2.0000 [IU] | SUBCUTANEOUS | Status: DC
  Administered 2012-11-11: 2 [IU] via SUBCUTANEOUS
  Administered 2012-11-11: 4 [IU] via SUBCUTANEOUS
  Administered 2012-11-11 – 2012-11-13 (×9): 2 [IU] via SUBCUTANEOUS
  Administered 2012-11-13: 4 [IU] via SUBCUTANEOUS
  Administered 2012-11-13 – 2012-11-20 (×16): 2 [IU] via SUBCUTANEOUS

## 2012-11-10 MED ORDER — MIDAZOLAM HCL 2 MG/2ML IJ SOLN
INTRAMUSCULAR | Status: AC
  Filled 2012-11-10: qty 4

## 2012-11-10 MED ORDER — SODIUM CHLORIDE 0.9 % IV SOLN
250.0000 mL | INTRAVENOUS | Status: DC | PRN
  Administered 2012-11-11 – 2012-11-21 (×4): 250 mL via INTRAVENOUS

## 2012-11-10 MED ORDER — HYDROCORTISONE SOD SUCCINATE 100 MG IJ SOLR
50.0000 mg | Freq: Four times a day (QID) | INTRAMUSCULAR | Status: DC
Start: 2012-11-11 — End: 2012-11-12
  Administered 2012-11-11 – 2012-11-12 (×5): 50 mg via INTRAVENOUS
  Filled 2012-11-10 (×10): qty 1

## 2012-11-10 MED ORDER — DEXTROSE 50 % IV SOLN: 1.0000 | Freq: Once | INTRAVENOUS | Status: DC

## 2012-11-10 MED ORDER — FUROSEMIDE 10 MG/ML IJ SOLN
200.0000 mg | Freq: Four times a day (QID) | INTRAVENOUS | Status: AC
  Administered 2012-11-10 – 2012-11-11 (×2): 200 mg via INTRAVENOUS
  Filled 2012-11-10 (×2): qty 20

## 2012-11-10 MED ORDER — MIDAZOLAM BOLUS VIA INFUSION
1.0000 mg | INTRAVENOUS | Status: DC | PRN
  Filled 2012-11-10: qty 2

## 2012-11-10 MED ORDER — LIDOCAINE HCL (CARDIAC) 20 MG/ML IV SOLN
INTRAVENOUS | Status: AC
  Filled 2012-11-10: qty 5

## 2012-11-10 MED ORDER — VANCOMYCIN HCL 10 G IV SOLR
1500.0000 mg | Freq: Once | INTRAVENOUS | Status: AC
  Administered 2012-11-10: 1500 mg via INTRAVENOUS
  Filled 2012-11-10: qty 1500

## 2012-11-10 MED ORDER — KETAMINE HCL 10 MG/ML IJ SOLN
200.0000 mg | Freq: Once | INTRAMUSCULAR | Status: AC
  Administered 2012-11-10: 150 mg via INTRAVENOUS
  Filled 2012-11-10: qty 20

## 2012-11-10 MED ORDER — INSULIN ASPART 100 UNIT/ML ~~LOC~~ SOLN: 10.0000 [IU] | Freq: Once | SUBCUTANEOUS | Status: DC

## 2012-11-10 MED ORDER — SODIUM CHLORIDE 0.9 % IV SOLN
1.0000 mg/h | INTRAVENOUS | Status: DC
  Administered 2012-11-11: 1 mg/h via INTRAVENOUS
  Filled 2012-11-10: qty 10

## 2012-11-10 MED ORDER — SODIUM CHLORIDE 0.9 % IV BOLUS (SEPSIS)
25.0000 mL/kg | Freq: Once | INTRAVENOUS | Status: DC
  Filled 2012-11-10: qty 4200

## 2012-11-10 MED ORDER — STERILE WATER FOR INJECTION IV SOLN
INTRAVENOUS | Status: DC
  Filled 2012-11-10 (×3): qty 850

## 2012-11-10 MED ORDER — SODIUM POLYSTYRENE SULFONATE 15 GM/60ML PO SUSP
30.0000 g | Freq: Once | ORAL | Status: AC
  Administered 2012-11-11: 30 g
  Filled 2012-11-10: qty 120

## 2012-11-10 MED ORDER — ETOMIDATE 2 MG/ML IV SOLN
INTRAVENOUS | Status: AC
  Filled 2012-11-10: qty 20

## 2012-11-10 MED ORDER — DEXTROSE 5 % IV SOLN
INTRAVENOUS | Status: AC
  Filled 2012-11-10: qty 250

## 2012-11-10 MED ORDER — SODIUM CHLORIDE 0.9 % IV BOLUS (SEPSIS): 500.0000 mL | Freq: Once | INTRAVENOUS | Status: DC

## 2012-11-10 MED ORDER — ROCURONIUM BROMIDE 50 MG/5ML IV SOLN
INTRAVENOUS | Status: AC
  Filled 2012-11-10: qty 2

## 2012-11-10 MED ORDER — NOREPINEPHRINE BITARTRATE 1 MG/ML IJ SOLN
INTRAMUSCULAR | Status: AC
  Filled 2012-11-10: qty 4

## 2012-11-10 MED ORDER — MIDAZOLAM HCL 2 MG/2ML IJ SOLN
4.0000 mg | Freq: Once | INTRAMUSCULAR | Status: DC
  Filled 2012-11-10: qty 4
  Filled 2012-11-10: qty 2

## 2012-11-10 MED ORDER — HEPARIN SODIUM (PORCINE) 5000 UNIT/ML IJ SOLN
5000.0000 [IU] | Freq: Three times a day (TID) | INTRAMUSCULAR | Status: DC
  Administered 2012-11-11 – 2012-11-15 (×13): 5000 [IU] via SUBCUTANEOUS
  Filled 2012-11-10 (×16): qty 1

## 2012-11-10 MED ORDER — SODIUM CHLORIDE 0.9 % IV BOLUS (SEPSIS): 500.0000 mL | INTRAVENOUS | Status: DC | PRN

## 2012-11-10 MED ORDER — SUCCINYLCHOLINE CHLORIDE 20 MG/ML IJ SOLN
INTRAMUSCULAR | Status: AC
  Filled 2012-11-10: qty 1

## 2012-11-10 MED ORDER — NOREPINEPHRINE BITARTRATE 1 MG/ML IJ SOLN
2.0000 ug/min | INTRAVENOUS | Status: DC | PRN
  Filled 2012-11-10: qty 4

## 2012-11-10 MED ORDER — VASOPRESSIN 20 UNIT/ML IJ SOLN: 0.0300 [IU]/min | INTRAVENOUS | Status: DC

## 2012-11-10 MED ORDER — DOBUTAMINE IN D5W 4-5 MG/ML-% IV SOLN
2.5000 ug/kg/min | INTRAVENOUS | Status: DC | PRN
  Administered 2012-11-11: 2.5 ug/kg/min via INTRAVENOUS
  Filled 2012-11-10: qty 250

## 2012-11-10 MED ORDER — SODIUM CHLORIDE 0.9 % IV SOLN: INTRAVENOUS | Status: DC

## 2012-11-10 MED ORDER — KETAMINE HCL 10 MG/ML IJ SOLN
INTRAMUSCULAR | Status: AC | PRN
  Administered 2012-11-10: 50 mg via INTRAVENOUS

## 2012-11-10 MED ORDER — FENTANYL CITRATE 0.05 MG/ML IJ SOLN
50.0000 ug | INTRAMUSCULAR | Status: DC | PRN
  Administered 2012-11-11 (×4): 100 ug via INTRAVENOUS
  Filled 2012-11-10 (×4): qty 2

## 2012-11-10 MED ORDER — ALBUTEROL SULFATE HFA 108 (90 BASE) MCG/ACT IN AERS
4.0000 | INHALATION_SPRAY | RESPIRATORY_TRACT | Status: DC | PRN
  Filled 2012-11-10: qty 6.7

## 2012-11-10 MED ORDER — VANCOMYCIN HCL IN DEXTROSE 1-5 GM/200ML-% IV SOLN
1000.0000 mg | Freq: Once | INTRAVENOUS | Status: AC
  Administered 2012-11-10: 1000 mg via INTRAVENOUS
  Filled 2012-11-10: qty 200

## 2012-11-10 MED ORDER — VANCOMYCIN HCL IN DEXTROSE 1-5 GM/200ML-% IV SOLN: 1000.0000 mg | Freq: Once | INTRAVENOUS | Status: DC

## 2012-11-10 NOTE — ED Notes (Signed)
Home [phone] Alvan Dame (Husband)

## 2012-11-10 NOTE — ED Notes (Signed)
Pt. C/o SOB, pain with breathing, alert and oriented x4. Pt O2 mid 80s on [place], 100% on NRB. Hx CHF, HTN

## 2012-11-10 NOTE — H&P (Addendum)
Name: Maleiya Mcclenton MRN: [id number] DOB: 06-Mar-1961    LOS: 0  PULMONARY / CRITICAL CARE MEDICINE  HPI:   51 years old morbid obese female with complex PMH relevant for systolic CHF with LVEF of 35 to 40%, HTN, DM2, hypothyroidism, chronic pain on narcotics and CKD being evaluated by nephrology for dialysis. She also has a complex history of suppurative hidradenitis and is on chronic antibiotic therapy. Presents with one week history of increased fatigue and somnolence. Over the last 3 days she experienced progressive SOB. Her oral intake also decreased because of altered mental status and was not taking her regular medications. Today she was very lethargic and her husband decided to call EMS. At arrival to the ED on respiratory distress and hypoxic. She required intubation and mechanical ventilation. Chest X ray showed a LLL pneumonia. At the time of my exam the patient is intubated, sedated with BP borderline low.   Past Medical History  Diagnosis Date  . Systolic congestive heart failure   . SOB (shortness of breath)   . HTN (hypertension)   . DM type 2 (diabetes mellitus, type 2)   . Chronic kidney disease (CKD), stage III (moderate)   . Iron deficiency anemia   . Morbid obesity   . Hyperkalemia   . Hypothyroidism   . CHF (congestive heart failure)    Past Surgical History  Procedure Date  . Portacath placement   . Cystoscopy w/ ureteral stent placement 05/28/2012    Procedure: CYSTOSCOPY WITH RETROGRADE PYELOGRAM/URETERAL STENT PLACEMENT;  Surgeon: Reece Packer, MD;  Location: [facility]S;  Service: Urology;  Laterality: Left;   Prior to Admission medications   Medication Sig Start Date End Date Taking? Authorizing Provider  aspirin EC 81 MG EC tablet Take 1 tablet (81 mg total) by mouth daily. 01/30/12 01/29/13 Yes Ripudeep Krystal Eaton, MD  carvedilol (COREG) 6.25 MG tablet Take 6.25 mg by mouth 2 (two) times daily with a meal.   Yes Historical Provider, MD  cyclobenzaprine (FLEXERIL) 10 MG  tablet Take 10 mg by mouth 3 (three) times daily as needed. For Spasms   Yes Historical Provider, MD  DULoxetine (CYMBALTA) 60 MG capsule Take 60 mg by mouth daily.     Yes Historical Provider, MD  ferrous sulfate 325 (65 FE) MG tablet Take 325 mg by mouth 2 (two) times daily.     Yes Historical Provider, MD  folic acid (FOLVITE) 1 MG tablet Take 1 mg by mouth daily.   Yes Historical Provider, MD  furosemide (LASIX) 80 MG tablet Take 240 mg by mouth 2 (two) times daily.   Yes Historical Provider, MD  isosorbide mononitrate (IMDUR) 30 MG 24 hr tablet Take 30 mg by mouth daily.   Yes Historical Provider, MD  levothyroxine (SYNTHROID, LEVOTHROID) 25 MCG tablet Take 25 mcg by mouth every morning. Take on an empty stomach   Yes Historical Provider, MD  magnesium oxide (MAG-OX) 400 MG tablet Take 400 mg by mouth 2 (two) times daily.    Yes Historical Provider, MD  oxyCODONE-acetaminophen (PERCOCET) 5-325 MG per tablet Take 1 tablet by mouth every 6 (six) hours as needed. For pain   Yes Historical Provider, MD  pravastatin (PRAVACHOL) 40 MG tablet Take 40 mg by mouth daily.     Yes Historical Provider, MD  saccharomyces boulardii (FLORASTOR) 250 MG capsule Take 1 capsule (250 mg total) by mouth 2 (two) times daily. 08/23/12  Yes Domenic Polite, MD  sevelamer (RENVELA) 800 MG tablet Take 3  tablets (2,400 mg total) by mouth 3 (three) times daily with meals. 08/23/12 08/23/13 Yes Domenic Polite, MD  sodium bicarbonate 650 MG tablet Take 2 tablets (1,300 mg total) by mouth 3 (three) times daily. 08/23/12 08/23/13 Yes Domenic Polite, MD  sodium hypochlorite (DAKIN'S 1/4 STRENGTH) 0.125 % SOLN Irrigate with 1 application as directed daily as needed. Apply to wound with dressing changes   Yes Historical Provider, MD  vitamin C (ASCORBIC ACID) 500 MG tablet Take 500 mg by mouth daily.   Yes Historical Provider, MD  zinc sulfate 220 MG capsule Take 220 mg by mouth daily.   Yes Historical Provider, MD    Allergies Allergies  Allergen Reactions  . Amoxicillin-Pot Clavulanate Diarrhea  . Rosiglitazone Maleate     REACTION: swelling  . Amoxicillin Rash    Family History Family History  Problem Relation Age of Onset  . Coronary artery disease Mother   . Hypertension Mother   . Diabetes type II Mother   . Malignant hyperthermia Mother   . Coronary artery disease Father   . Hypertension Father   . Malignant hyperthermia Father   . Cancer Maternal Grandfather     ? Type  . Kidney failure Maternal Grandmother    Social History  reports that she has never smoked. She has never used smokeless tobacco. She reports that she does not drink alcohol or use illicit drugs.  Review Of Systems:  Unable to provide.   Vital Signs: Temp:  [98.8 F (37.1 C)] 98.8 F (37.1 C) (12/15 1801) Pulse Rate:  [63-78] 66  (12/15 2140) Resp:  [6-35] 31  (12/15 2140) BP: (71-115)/(10-91) 74/46 mmHg (12/15 2140) SpO2:  [95 %-100 %] 100 % (12/15 2140) FiO2 (%):  [15 %-100 %] 40 % (12/15 2108) Weight:  [366 lb 10 oz (166.3 kg)] 366 lb 10 oz (166.3 kg) (12/15 2100)  Physical Examination: General:  Morbid obese, Intubated, mechanically ventilated, no acute distress Neuro:  Sedated, synchronous, nonfocal HEENT:  PERRL, pink conjunctivae, dry mucous membranes Neck:  Supple, no JVD   Cardiovascular:  RRR, no M/R/G Lungs:  Bilateral diminished air entry, no W/R/R Abdomen:  Soft, nontender, nondistended, bowel sounds present Musculoskeletal:  Moves all extremities, no pedal edema Skin:  Severe actively suppurating hidradenitis to the perineal and groin area. Also present in the breast folds but less severe.  Active Problems:  * No active hospital problems. *    ASSESSMENT AND PLAN  PULMONARY  Lab 11/10/12 2105 11/10/12 1758  PHART 7.254* 7.177*  PCO2ART 40.2 50.1*  PO2ART 339.0* 75.0*  HCO3 17.8* 18.6*  O2SAT 100.0 91.0   Ventilator Settings: Vent Mode:  [-] PRVC FiO2 (%):  [15 %-100 %] 40  % Set Rate:  [18 bmp] 18 bmp Vt Set:  [500 mL] 500 mL PEEP:  [5 cmH20] 5 cmH20 Plateau Pressure:  [32 cmH20] 32 cmH20 CXR: Bilateral infiltrates with a more dense consolidation in the LLL. ETT:  Adequate position  A:   1) Acute hypoxemic and hypercarbic respiratory failure 2) Bilateral pneumonia in the setting of multiple and frequent antibiotic exposure. P:   - Will admit to ICU - Mechanical ventilation   - PRVC, Vt: 8cc/kg of ideal body weight, PEEP: 5, RR: 18, FiO2 100% and adjust to keep O2 sat >94% - VAP prevention order set - Albuterol PRN - Zosyn and vancomycin  CARDIOVASCULAR  Lab 11/10/12 1927 11/10/12 1724  TROPONINI -- <0.30  LATICACIDVEN 2.53* --  PROBNP -- 33908.0*   ECG:  NSR Lines: Left IJ in adequate position. 11/10/12  A:  1) Sepsis secondary to bilateral pneumonia 2) Systolic CHF 3) Metabolic acidosis (lactic) secondary to sepsis  P:  - Sepsis protocol in place - IVF's per sepsis protocol - Insert arterial line - Pressors per sepsis protocol if needed. - Echocardiogram in am  RENAL  Lab 11/10/12 1724  NA 129*  K 5.7*  CL 98  CO2 16*  BUN 86*  CREATININE 8.98*  CALCIUM 7.6*  MG --  PHOS --   Intake/Output      12/15 0701 - 12/16 0700   I.V. (mL/kg) 700 (4.2)   Total Intake(mL/kg) 700 (4.2)   Net +700        Foley:  11/10/12  A:   1) Acute on chronic renal failure 2) Hyperkalemia 3) Hyponatremia 4) Hypocalcemia likely not real. When corrected with last albumin of 2.0 on 08/25/12 is 9.2 P:   - IVF resuscitation per sepsis protocol - Kayexalate 30 g via NGT - Dextrose 50%, 1 amp with 10 units of novolog IV once - Will order ionized calcium and replenish if necessary. - Will repeat BMP at 2:00am - May need dialysis - Nephrology consult  GASTROINTESTINAL No results found for this basename: AST:5,ALT:5,ALKPHOS:5,BILITOT:5,PROT:5,ALBUMIN:5 in the last 168 hours  A:   1) No issues P:   - GI prophylaxis with  protonix  HEMATOLOGIC  Lab 11/10/12 1724  HGB 6.6*  HCT 22.4*  PLT 311  INR --  APTT --   A:   1) Anemia likely due to chronic disease and sepsis P:  - Will transfuse 1 unit of PRBC - Will follow CBC in am - Will check for occult blood in stools  INFECTIOUS  Lab 11/10/12 1724  WBC 32.0*  PROCALCITON --   Cultures: - Blood, urine and sputum cultures ordered. Antibiotics: Zosyn, vancomycin  A:   1) Sepsis - Bilateral pneumonia - Suppurative hidradenitis - UTI P:   - Sepsis protocol in place - Zosyn and vancomycin - We will consider ID consult in am.  ENDOCRINE No results found for this basename: GLUCAP:5 in the last 168 hours A:   1) DM 2  P:   - ICU hyperglycemia protocol with SQ Novolog   NEUROLOGIC  A:   1) Altered mental status likely due to sepsis and hypercarbia 2) Intubated, sedated P:   - Continuous sedation with Versed - Pain management with PRN Fentanyl - Daily awakening  BEST PRACTICE / DISPOSITION - Level of Care:  ICU - Primary Service:  PCCM - Consultants:  Nephrology - Code Status:  Full code - Diet:  NPO - DVT Px:  Heparin sq - GI Px:  Protonix IV - Skin Integrity:  Severe suppurative hidradenitis  - Social / Family:  Family updated ad bedside  The patient is critically ill with multiple organ systems failure and requires high complexity decision making for assessment and support, frequent evaluation and titration of therapies, application of advanced monitoring technologies and extensive interpretation of multiple databases.   Critical Care Time devoted to patient care services described in this note is: 1 Hour 30 Minutes  [name], M.D. Pulmonary and [place] Pager: [phone]  11/10/2012, 9:53 PM

## 2012-11-10 NOTE — ED Notes (Signed)
X-ray notified for portable chest to confirm ETT placement.

## 2012-11-10 NOTE — ED Notes (Signed)
Dr Pickering at bedside 

## 2012-11-10 NOTE — ED Notes (Signed)
Pt. States "I can't breathe". Pt. Placed on NRB but pt will not keep mask on, states "it's not helping me". Lung sounds clear.

## 2012-11-10 NOTE — ED Provider Notes (Signed)
History     CSN: [id number]  Arrival date & time 11/10/12  1634   First MD Initiated Contact with Patient 11/10/12 1638      Chief Complaint  Patient presents with  . Shortness of Breath   Level 5 caveat due to altered mental status.  (Consider location/radiation/quality/duration/timing/severity/associated sxs/prior treatment) Patient is a 51 y.o. female presenting with shortness of breath. The history is provided by the patient.  Shortness of Breath  Associated symptoms include shortness of breath.   patient is reportedly been doing worse for the last week. Increasing shortness of breath. Increasing drainage out of her hidradenitis. She's had decreased oral intake. She's had a decreased mental status today. She will will answer questions, but I cannot understand the answers. Her family member also cannot understand her.  Past Medical History  Diagnosis Date  . Systolic congestive heart failure   . SOB (shortness of breath)   . HTN (hypertension)   . DM type 2 (diabetes mellitus, type 2)   . Chronic kidney disease (CKD), stage III (moderate)   . Iron deficiency anemia   . Morbid obesity   . Hyperkalemia   . Hypothyroidism   . CHF (congestive heart failure)     Past Surgical History  Procedure Date  . Portacath placement   . Cystoscopy w/ ureteral stent placement 05/28/2012    Procedure: CYSTOSCOPY WITH RETROGRADE PYELOGRAM/URETERAL STENT PLACEMENT;  Surgeon: Reece Packer, MD;  Location: [facility];  Service: Urology;  Laterality: Left;    Family History  Problem Relation Age of Onset  . Coronary artery disease Mother   . Hypertension Mother   . Diabetes type II Mother   . Malignant hyperthermia Mother   . Coronary artery disease Father   . Hypertension Father   . Malignant hyperthermia Father   . Cancer Maternal Grandfather     ? Type  . Kidney failure Maternal Grandmother     History  Substance Use Topics  . Smoking status: Never Smoker   . Smokeless  tobacco: Never Used  . Alcohol Use: No    OB History    Grav Para Term Preterm Abortions TAB SAB Ect Mult Living                  Review of Systems  Unable to perform ROS: Mental status change  Respiratory: Positive for shortness of breath.     Allergies  Amoxicillin-pot clavulanate; Rosiglitazone maleate; and Amoxicillin  Home Medications   No current outpatient prescriptions on file.  BP 73/41  Pulse 56  Temp 98.6 F (37 C) (Oral)  Resp 18  Ht 5' 6.14" (1.68 m)  Wt 378 lb 12 oz (171.8 kg)  BMI 60.87 kg/m2  SpO2 98%  Physical Exam  Constitutional: She appears well-developed.       Patient is morbidly obese  HENT:  Head: Normocephalic.  Eyes: Pupils are equal, round, and reactive to light.  Cardiovascular: Normal rate.   Pulmonary/Chest:       Diffuse rails. Prolonged expirations. Hypoxic on room air.  Abdominal: There is tenderness.  Musculoskeletal: She exhibits edema.  Neurological:       Decreased mental status. Patient will answer questions but they answers cannot be understood.  Skin: Skin is warm.       Draining hidradenitis with copious purulence to posterior thighs near perineal Area. Also to chest wall.    ED Course  Procedures (including critical care time)  Labs Reviewed  CBC WITH DIFFERENTIAL - Abnormal;  Notable for the following:    WBC 32.0 (*)     RBC 2.43 (*)     Hemoglobin 6.6 (*)     HCT 22.4 (*)     MCHC 29.5 (*)     RDW 17.0 (*)     Neutrophils Relative 96 (*)     Lymphocytes Relative 1 (*)     Neutro Abs 30.7 (*)     Lymphs Abs 0.3 (*)     All other components within normal limits  URINALYSIS, ROUTINE W REFLEX MICROSCOPIC - Abnormal; Notable for the following:    Color, Urine AMBER (*)  BIOCHEMICALS MAY BE AFFECTED BY COLOR   APPearance TURBID (*)     Hgb urine dipstick LARGE (*)     Ketones, ur 15 (*)     Protein, ur >300 (*)     Leukocytes, UA LARGE (*)     All other components within normal limits  BASIC METABOLIC PANEL  - Abnormal; Notable for the following:    Sodium 129 (*)     Potassium 5.7 (*)     CO2 16 (*)     Glucose, Bld 137 (*)     BUN 86 (*)     Creatinine, Ser 8.98 (*)     Calcium 7.6 (*)     GFR calc non Af Amer 5 (*)     GFR calc Af Amer 5 (*)     All other components within normal limits  PRO B NATRIURETIC PEPTIDE - Abnormal; Notable for the following:    Pro B Natriuretic peptide (BNP) 33908.0 (*)     All other components within normal limits  POCT I-STAT 3, BLOOD GAS (G3+) - Abnormal; Notable for the following:    pH, Arterial 7.177 (*)     pCO2 arterial 50.1 (*)     pO2, Arterial 75.0 (*)     Bicarbonate 18.6 (*)     Acid-base deficit 9.0 (*)     All other components within normal limits  URINE MICROSCOPIC-ADD ON - Abnormal; Notable for the following:    Bacteria, UA MANY (*)     All other components within normal limits  CG4 I-STAT (LACTIC ACID) - Abnormal; Notable for the following:    Lactic Acid, Venous 2.53 (*)     All other components within normal limits  LACTIC ACID, PLASMA - Abnormal; Notable for the following:    Lactic Acid, Venous 2.4 (*)     All other components within normal limits  POCT I-STAT 3, BLOOD GAS (G3+) - Abnormal; Notable for the following:    pH, Arterial 7.254 (*)     pO2, Arterial 339.0 (*)     Bicarbonate 17.8 (*)     Acid-base deficit 9.0 (*)     All other components within normal limits  CARBOXYHEMOGLOBIN - Abnormal; Notable for the following:    Total hemoglobin 6.2 (*)     All other components within normal limits  GLUCOSE, CAPILLARY - Abnormal; Notable for the following:    Glucose-Capillary 108 (*)     All other components within normal limits  POCT I-STAT 3, BLOOD GAS (G3+) - Abnormal; Notable for the following:    pH, Arterial 6.890 (*)     pCO2 arterial 52.3 (*)     Bicarbonate 9.9 (*)     Acid-base deficit 22.0 (*)     All other components within normal limits  POCT I-STAT 7, (LYTES, BLD GAS, ICA,H+H) - Abnormal; Notable for the  following:  pH, Arterial 6.889 (*)     pCO2 arterial 53.1 (*)     Bicarbonate 10.1 (*)     Acid-base deficit 22.0 (*)     Sodium 133 (*)     Potassium 7.4 (*)     Calcium, Ion 1.06 (*)     HCT 28.0 (*)     Hemoglobin 9.5 (*)     All other components within normal limits  PROTIME-INR - Abnormal; Notable for the following:    Prothrombin Time 20.1 (*)     INR 1.78 (*)     All other components within normal limits  CBC WITH DIFFERENTIAL - Abnormal; Notable for the following:    WBC 38.2 (*)     RBC 2.61 (*)     Hemoglobin 7.1 (*)     HCT 24.2 (*)     MCHC 29.3 (*)     RDW 16.9 (*)     All other components within normal limits  LACTIC ACID, PLASMA - Abnormal; Notable for the following:    Lactic Acid, Venous 11.3 (*)     All other components within normal limits  TROPONIN I  TYPE AND SCREEN  PROCALCITONIN  PREPARE RBC (CROSSMATCH)  MRSA PCR SCREENING  URINE CULTURE  CULTURE, BLOOD (ROUTINE X 2)  CULTURE, BLOOD (ROUTINE X 2)  BLOOD GAS, ARTERIAL  CBC  BLOOD GAS, ARTERIAL  MAGNESIUM  PHOSPHORUS  CORTISOL  CBC  PROTIME-INR  APTT  FIBRINOGEN  CULTURE, EXPECTORATED SPUTUM-ASSESSMENT  COMPREHENSIVE METABOLIC PANEL  IRON AND TIBC  SODIUM, URINE, RANDOM  CREATININE, URINE, RANDOM  ANTISTREPTOLYSIN O TITER  PARATHYROID HORMONE, INTACT (NO CA)  CALCIUM, IONIZED  BASIC METABOLIC PANEL  COMPREHENSIVE METABOLIC PANEL  RENAL FUNCTION PANEL  RENAL FUNCTION PANEL  APTT   Dg Chest Portable 1 View  11/10/2012  *RADIOLOGY REPORT*  Clinical Data: Left central line placement.  Altered mental status. Cough and chest congestion.  PORTABLE CHEST - 1 VIEW 8:26 p.m.  Comparison: Chest x-ray dated 11/10/2012 at 7:52 p.m.  Findings: Endotracheal tube in good position.  Central venous catheter tip appears to be in the superior vena cava at the level of the azygos vein.  Increasing pulmonary vascular congestion and pulmonary edema or pneumonia with consolidation in the left perihilar  region.  IMPRESSION: Central line appears in good position.  No pneumothorax. Progressive bilateral infiltrates.   Original Report Authenticated By: Lorriane Shire, M.D.    Dg Chest Port 1 View  11/10/2012  *RADIOLOGY REPORT*  Clinical Data: Post intubation  PORTABLE CHEST - 1 VIEW  Comparison: 11/10/2012 at 1822 hours  Findings: Endotracheal tube terminates 3 cm above the carina.  Cardiomegaly with suspected mild interstitial edema.  Mild patchy retrocardiac/left lower lobe opacity, atelectasis versus pneumonia.  No pleural effusion or pneumothorax.  Enteric tube courses below the diaphragm.  IMPRESSION: Endotracheal tube terminates 3 cm above the carina.  Cardiomegaly with suspected mild interstitial edema.  Mild left lower lobe opacity, atelectasis versus pneumonia.   Original Report Authenticated By: Julian Hy, M.D.    Dg Chest Port 1 View  11/10/2012  *RADIOLOGY REPORT*  Clinical Data: Altered mental status, cough, congestion  PORTABLE CHEST - 1 VIEW  Comparison: 08/16/2012  Findings: Cardiomegaly with pulmonary vascular congestion.  No frank interstitial edema.  Patchy left lower lobe opacity, atelectasis versus pneumonia.  No pleural effusion or pneumothorax.  IMPRESSION: Patchy left lower lobe opacity, atelectasis versus pneumonia.  Cardiomegaly with pulmonary vascular congestion.  No frank interstitial edema.   Original Report  Authenticated By: Julian Hy, M.D.      1. Sepsis   2. ARF (acute renal failure)   3. Hidradenitis suppurativa   4. Anemia   5. Acute on chronic systolic heart failure   6. CAP (community acquired pneumonia)   51. UTI (urinary tract infection)      Date: 11/10/2012  Rate: 71  Rhythm: normal sinus rhythm  QRS Axis: right  Intervals: normal  ST/T Wave abnormalities: normal  Conduction Disutrbances:low QRS voltages.   Narrative Interpretation:   Old EKG Reviewed: unchanged  CRITICAL CARE Performed by: Mackie Pai   Total critical care  time: 30  Critical care time was exclusive of separately billable procedures and treating other patients.  Critical care was necessary to treat or prevent imminent or life-threatening deterioration.  Critical care was time spent personally by me on the following activities: development of treatment plan with patient and/or surrogate as well as nursing, discussions with consultants, evaluation of patient's response to treatment, examination of patient, obtaining history from patient or surrogate, ordering and performing treatments and interventions, ordering and review of laboratory studies, ordering and review of radiographic studies, pulse oximetry and re-evaluation of patient's condition.  MDM  Patient presents with shortness of breath and altered mental status. Hypoxia. Pneumonia on x-ray along with possible CHF. Patient has a urinary tract infection also. She also has severe hidradenitis. She has worsening renal failure with mildly elevated potassium. Her mental status continued to be depressed. BiPAP did not improve this. Patient required intubation. She was intubated with ketamine since the suspicion was high for a difficult intubation. She was admitted to the ICU        Jasper Riling. Alvino Chapel, MD 11/11/12 [phone]

## 2012-11-10 NOTE — Procedures (Signed)
Central Venous Catheter Insertion Procedure Note [name] [id number] December 13, 1960  Procedure: Insertion of Central Venous Catheter Indications: Assessment of intravascular volume, Drug and/or fluid administration and Frequent blood sampling  Procedure Details Consent: Unable to obtain consent because of emergent medical necessity. Time Out: Verified patient identification, verified procedure, site/side was marked, verified correct patient position, special equipment/implants available, medications/allergies/relevent history reviewed, required imaging and test results available.  Performed  Maximum sterile technique was used including antiseptics, cap, gloves, gown, hand hygiene, mask and sheet. Skin prep: Chlorhexidine; local anesthetic administered A antimicrobial bonded/coated triple lumen catheter was placed in the left internal jugular vein under direct ultrasound visualization using the Seldinger technique.  Evaluation Blood flow good Complications: No apparent complications Patient did tolerate procedure well. Chest X-ray ordered to verify placement.  CXR: normal.  [name], M.D. Pulmonary and Critical Care Medicine Call [place] with questions [phone] 11/10/2012, 10:26 PM

## 2012-11-10 NOTE — ED Provider Notes (Signed)
History     CSN: [id number]  Arrival date & time 11/10/12  1634   First MD Initiated Contact with Patient 11/10/12 1638      Chief Complaint  Patient presents with  . Shortness of Breath    (Consider location/radiation/quality/duration/timing/severity/associated sxs/prior treatment) HPI  Past Medical History  Diagnosis Date  . Systolic congestive heart failure   . SOB (shortness of breath)   . HTN (hypertension)   . DM type 2 (diabetes mellitus, type 2)   . Chronic kidney disease (CKD), stage III (moderate)   . Iron deficiency anemia   . Morbid obesity   . Hyperkalemia   . Hypothyroidism   . CHF (congestive heart failure)     Past Surgical History  Procedure Date  . Portacath placement   . Cystoscopy w/ ureteral stent placement 05/28/2012    Procedure: CYSTOSCOPY WITH RETROGRADE PYELOGRAM/URETERAL STENT PLACEMENT;  Surgeon: Reece Packer, MD;  Location: [facility];  Service: Urology;  Laterality: Left;    Family History  Problem Relation Age of Onset  . Coronary artery disease Mother   . Hypertension Mother   . Diabetes type II Mother   . Malignant hyperthermia Mother   . Coronary artery disease Father   . Hypertension Father   . Malignant hyperthermia Father   . Cancer Maternal Grandfather     ? Type  . Kidney failure Maternal Grandmother     History  Substance Use Topics  . Smoking status: Never Smoker   . Smokeless tobacco: Never Used  . Alcohol Use: No    OB History    Grav Para Term Preterm Abortions TAB SAB Ect Mult Living                  Review of Systems  Allergies  Amoxicillin-pot clavulanate; Rosiglitazone maleate; and Amoxicillin  Home Medications   Current Outpatient Rx  Name  Route  Sig  Dispense  Refill  . ASPIRIN 81 MG PO TBEC   Oral   Take 1 tablet (81 mg total) by mouth daily.   30 tablet   3   . CARVEDILOL 6.25 MG PO TABS   Oral   Take 6.25 mg by mouth 2 (two) times daily with a meal.         . CYCLOBENZAPRINE  HCL 10 MG PO TABS   Oral   Take 10 mg by mouth 3 (three) times daily as needed. For Spasms         . DULOXETINE HCL 60 MG PO CPEP   Oral   Take 60 mg by mouth daily.           Marland Kitchen FERROUS SULFATE 325 (65 FE) MG PO TABS   Oral   Take 325 mg by mouth 2 (two) times daily.           Marland Kitchen FOLIC ACID 1 MG PO TABS   Oral   Take 1 mg by mouth daily.         . FUROSEMIDE 80 MG PO TABS   Oral   Take 240 mg by mouth 2 (two) times daily.         . ISOSORBIDE MONONITRATE ER 30 MG PO TB24   Oral   Take 30 mg by mouth daily.         Marland Kitchen LEVOTHYROXINE SODIUM 25 MCG PO TABS   Oral   Take 25 mcg by mouth every morning. Take on an empty stomach         .  MAGNESIUM OXIDE 400 MG PO TABS   Oral   Take 400 mg by mouth 2 (two) times daily.          . OXYCODONE-ACETAMINOPHEN 5-325 MG PO TABS   Oral   Take 1 tablet by mouth every 6 (six) hours as needed. For pain         . PRAVASTATIN SODIUM 40 MG PO TABS   Oral   Take 40 mg by mouth daily.           Marland Kitchen SACCHAROMYCES BOULARDII 250 MG PO CAPS   Oral   Take 1 capsule (250 mg total) by mouth 2 (two) times daily.   30 capsule   0   . SEVELAMER CARBONATE 800 MG PO TABS   Oral   Take 3 tablets (2,400 mg total) by mouth 3 (three) times daily with meals.   180 tablet   0   . SODIUM BICARBONATE 650 MG PO TABS   Oral   Take 2 tablets (1,300 mg total) by mouth 3 (three) times daily.   120 tablet   0   . DAKINS (1/4 STRENGTH) 0.125 % EX SOLN   Irrigation   Irrigate with 1 application as directed daily as needed. Apply to wound with dressing changes         . VITAMIN C 500 MG PO TABS   Oral   Take 500 mg by mouth daily.         Marland Kitchen ZINC SULFATE 220 MG PO CAPS   Oral   Take 220 mg by mouth daily.           BP 108/83  Pulse 63  Temp 98.8 F (37.1 C) (Oral)  Resp 13  SpO2 98%  Physical Exam  ED Course  INTUBATION Date/Time: 11/10/2012 7:30 PM Performed by: Evelina Bucy Authorized by: Mackie Pai. Consent: The procedure was performed in an emergent situation. Time out: Immediately prior to procedure a "time out" was called to verify the correct patient, procedure, equipment, support staff and site/side marked as required. Indications: respiratory distress and respiratory failure Intubation method: video-assisted Patient status: sedated Preoxygenation: BVM (BiPap) Sedatives: ketmine Laryngoscope size: glidescope 4. Tube size: 7.5 mm Tube type: cuffed Number of attempts: 2 Ventilation between attempts: BVM Cricoid pressure: no Cords visualized: yes Post-procedure assessment: ETCO2 monitor Breath sounds: equal Cuff inflated: yes ETT to lip: 23 cm Tube secured with: ETT holder Chest x-ray interpreted by radiologist. Chest x-ray findings: endotracheal tube in appropriate position Patient tolerance: Patient tolerated the procedure well with no immediate complications.   (including critical care time)  Labs Reviewed  CBC WITH DIFFERENTIAL - Abnormal; Notable for the following:    WBC 32.0 (*)     RBC 2.43 (*)     Hemoglobin 6.6 (*)     HCT 22.4 (*)     MCHC 29.5 (*)     RDW 17.0 (*)     Neutrophils Relative 96 (*)     Lymphocytes Relative 1 (*)     Neutro Abs 30.7 (*)     Lymphs Abs 0.3 (*)     All other components within normal limits  URINALYSIS, ROUTINE W REFLEX MICROSCOPIC - Abnormal; Notable for the following:    Color, Urine AMBER (*)  BIOCHEMICALS MAY BE AFFECTED BY COLOR   APPearance TURBID (*)     Hgb urine dipstick LARGE (*)     Ketones, ur 15 (*)     Protein, ur >300 (*)  Leukocytes, UA LARGE (*)     All other components within normal limits  BASIC METABOLIC PANEL - Abnormal; Notable for the following:    Sodium 129 (*)     Potassium 5.7 (*)     CO2 16 (*)     Glucose, Bld 137 (*)     BUN 86 (*)     Creatinine, Ser 8.98 (*)     Calcium 7.6 (*)     GFR calc non Af Amer 5 (*)     GFR calc Af Amer 5 (*)     All other components within normal  limits  PRO B NATRIURETIC PEPTIDE - Abnormal; Notable for the following:    Pro B Natriuretic peptide (BNP) 33908.0 (*)     All other components within normal limits  POCT I-STAT 3, BLOOD GAS (G3+) - Abnormal; Notable for the following:    pH, Arterial 7.177 (*)     pCO2 arterial 50.1 (*)     pO2, Arterial 75.0 (*)     Bicarbonate 18.6 (*)     Acid-base deficit 9.0 (*)     All other components within normal limits  URINE MICROSCOPIC-ADD ON - Abnormal; Notable for the following:    Bacteria, UA MANY (*)     All other components within normal limits  TROPONIN I  URINE CULTURE  CULTURE, BLOOD (ROUTINE X 2)  CULTURE, BLOOD (ROUTINE X 2)  TYPE AND SCREEN   Dg Chest Port 1 View  11/10/2012  *RADIOLOGY REPORT*  Clinical Data: Altered mental status, cough, congestion  PORTABLE CHEST - 1 VIEW  Comparison: 08/16/2012  Findings: Cardiomegaly with pulmonary vascular congestion.  No frank interstitial edema.  Patchy left lower lobe opacity, atelectasis versus pneumonia.  No pleural effusion or pneumothorax.  IMPRESSION: Patchy left lower lobe opacity, atelectasis versus pneumonia.  Cardiomegaly with pulmonary vascular congestion.  No frank interstitial edema.   Original Report Authenticated By: Julian Hy, M.D.      No diagnosis found.    MDM  Note for intubation - for full details of patient's stay, please see Dr. Neomia Dear note.       Evelina Bucy, MD 11/10/12 [phone]

## 2012-11-10 NOTE — ED Notes (Signed)
Verbal order for 500 ml bolus of NS for hypotension.

## 2012-11-10 NOTE — Consult Note (Signed)
Reason for Consult:AKI/CKD Referring Physician: Dr. Chestine Spore [name] is an 51 y.o. female.  HPI: 51 yr female with hx morbid obesity, severe hydradenitis, DM Type 2, HTN, anemia of CKD and Fe defic, Hypothyroid, renal stones with obstruction, CKD4 with hx several bouts of AKI with Cr as high as high 6s.  Now presented with SOB and found to be hypotensive, with probable sepsis. Entubated due to dyspnea.  Also Cr 8.98 and bicarb of 16.  Able to be oxygenated but K of 5.7.  Also anemic with hb 6.6.  CXR with bilat infilt vs CHF.        Sees Dr. Bjorn Pippin at our office.  Has severe secondary HPTH also. Pertinent items are noted in HPI. ROS not obtainable at this time   . Primary Nephrologist Dr Bjorn Pippin. .  Past Medical History  Diagnosis Date  . Systolic congestive heart failure   . SOB (shortness of breath)   . HTN (hypertension)   . DM type 2 (diabetes mellitus, type 2)   . Chronic kidney disease (CKD), stage III (moderate)   . Iron deficiency anemia   . Morbid obesity   . Hyperkalemia   . Hypothyroidism   . CHF (congestive heart failure)     Past Surgical History  Procedure Date  . Portacath placement   . Cystoscopy w/ ureteral stent placement 05/28/2012    Procedure: CYSTOSCOPY WITH RETROGRADE PYELOGRAM/URETERAL STENT PLACEMENT;  Surgeon: Reece Packer, MD;  Location: [facility];  Service: Urology;  Laterality: Left;    Family History  Problem Relation Age of Onset  . Coronary artery disease Mother   . Hypertension Mother   . Diabetes type II Mother   . Malignant hyperthermia Mother   . Coronary artery disease Father   . Hypertension Father   . Malignant hyperthermia Father   . Cancer Maternal Grandfather     ? Type  . Kidney failure Maternal Grandmother     Social History:  reports that she has never smoked. She has never used smokeless tobacco. She reports that she does not drink alcohol or use illicit drugs.  Allergies:  Allergies  Allergen Reactions  .  Amoxicillin-Pot Clavulanate Diarrhea  . Rosiglitazone Maleate     REACTION: swelling  . Amoxicillin Rash    Medications: I have reviewed the patient's current medications. Prior to Admission:  (Not in a hospital admission)  Results for orders placed during the hospital encounter of 11/10/12 (from the past 48 hour(s))  CBC WITH DIFFERENTIAL     Status: Abnormal   Collection Time   11/10/12  5:24 PM      Component Value Range Comment   WBC 32.0 (*) 4.0 - 10.5 K/uL    RBC 2.43 (*) 3.87 - 5.11 MIL/uL    Hemoglobin 6.6 (*) 12.0 - 15.0 g/dL    HCT 22.4 (*) 36.0 - 46.0 %    MCV 92.2  78.0 - 100.0 fL    MCH 27.2  26.0 - 34.0 pg    MCHC 29.5 (*) 30.0 - 36.0 g/dL    RDW 17.0 (*) 11.5 - 15.5 %    Platelets 311  150 - 400 K/uL    Neutrophils Relative 96 (*) 43 - 77 %    Lymphocytes Relative 1 (*) 12 - 46 %    Monocytes Relative 3  3 - 12 %    Eosinophils Relative 0  0 - 5 %    Basophils Relative 0  0 - 1 %  Neutro Abs 30.7 (*) 1.7 - 7.7 K/uL    Lymphs Abs 0.3 (*) 0.7 - 4.0 K/uL    Monocytes Absolute 1.0  0.1 - 1.0 K/uL    Eosinophils Absolute 0.0  0.0 - 0.7 K/uL    Basophils Absolute 0.0  0.0 - 0.1 K/uL   BASIC METABOLIC PANEL     Status: Abnormal   Collection Time   11/10/12  5:24 PM      Component Value Range Comment   Sodium 129 (*) 135 - 145 mEq/L    Potassium 5.7 (*) 3.5 - 5.1 mEq/L    Chloride 98  96 - 112 mEq/L    CO2 16 (*) 19 - 32 mEq/L    Glucose, Bld 137 (*) 70 - 99 mg/dL    BUN 86 (*) 6 - 23 mg/dL    Creatinine, Ser 8.98 (*) 0.50 - 1.10 mg/dL    Calcium 7.6 (*) 8.4 - 10.5 mg/dL    GFR calc non Af Amer 5 (*) >90 mL/min    GFR calc Af Amer 5 (*) >90 mL/min   PRO B NATRIURETIC PEPTIDE     Status: Abnormal   Collection Time   11/10/12  5:24 PM      Component Value Range Comment   Pro B Natriuretic peptide (BNP) 33908.0 (*) 0 - 125 pg/mL   TROPONIN I     Status: Normal   Collection Time   11/10/12  5:24 PM      Component Value Range Comment   Troponin I <0.30   <0.30 ng/mL   URINALYSIS, ROUTINE W REFLEX MICROSCOPIC     Status: Abnormal   Collection Time   11/10/12  5:51 PM      Component Value Range Comment   Color, Urine AMBER (*) YELLOW BIOCHEMICALS MAY BE AFFECTED BY COLOR   APPearance TURBID (*) CLEAR    Specific Gravity, Urine 1.019  1.005 - 1.030    pH 6.0  5.0 - 8.0    Glucose, UA NEGATIVE  NEGATIVE mg/dL    Hgb urine dipstick LARGE (*) NEGATIVE    Bilirubin Urine NEGATIVE  NEGATIVE    Ketones, ur 15 (*) NEGATIVE mg/dL    Protein, ur >300 (*) NEGATIVE mg/dL    Urobilinogen, UA 0.2  0.0 - 1.0 mg/dL    Nitrite NEGATIVE  NEGATIVE    Leukocytes, UA LARGE (*) NEGATIVE   URINE MICROSCOPIC-ADD ON     Status: Abnormal   Collection Time   11/10/12  5:51 PM      Component Value Range Comment   WBC, UA TOO NUMEROUS TO COUNT  <3 WBC/hpf    RBC / HPF 7-10  <3 RBC/hpf    Bacteria, UA MANY (*) RARE   POCT I-STAT 3, BLOOD GAS (G3+)     Status: Abnormal   Collection Time   11/10/12  5:58 PM      Component Value Range Comment   pH, Arterial 7.177 (*) 7.350 - 7.450    pCO2 arterial 50.1 (*) 35.0 - 45.0 mmHg    pO2, Arterial 75.0 (*) 80.0 - 100.0 mmHg    Bicarbonate 18.6 (*) 20.0 - 24.0 mEq/L    TCO2 20  0 - 100 mmol/L    O2 Saturation 91.0      Acid-base deficit 9.0 (*) 0.0 - 2.0 mmol/L    Patient temperature 98.6 F      Collection site RADIAL, ALLEN'S TEST ACCEPTABLE      Drawn by RT  Sample type ARTERIAL      Comment NOTIFIED PHYSICIAN     TYPE AND SCREEN     Status: Normal (Preliminary result)   Collection Time   11/10/12  7:00 PM      Component Value Range Comment   ABO/RH(D) B POS      Antibody Screen NEG      Sample Expiration 11/13/2012      Unit Number [id number]      Blood Component Type RED CELLS,LR      Unit division 00      Status of Unit ISSUED      Transfusion Status OK TO TRANSFUSE      Crossmatch Result Compatible     CG4 I-STAT (LACTIC ACID)     Status: Abnormal   Collection Time   11/10/12  7:27 PM       Component Value Range Comment   Lactic Acid, Venous 2.53 (*) 0.5 - 2.2 mmol/L   LACTIC ACID, PLASMA     Status: Abnormal   Collection Time   11/10/12  8:55 PM      Component Value Range Comment   Lactic Acid, Venous 2.4 (*) 0.5 - 2.2 mmol/L   POCT I-STAT 3, BLOOD GAS (G3+)     Status: Abnormal   Collection Time   11/10/12  9:05 PM      Component Value Range Comment   pH, Arterial 7.254 (*) 7.350 - 7.450    pCO2 arterial 40.2  35.0 - 45.0 mmHg    pO2, Arterial 339.0 (*) 80.0 - 100.0 mmHg    Bicarbonate 17.8 (*) 20.0 - 24.0 mEq/L    TCO2 19  0 - 100 mmol/L    O2 Saturation 100.0      Acid-base deficit 9.0 (*) 0.0 - 2.0 mmol/L    Patient temperature 98.6 F      Collection site RADIAL, ALLEN'S TEST ACCEPTABLE      Drawn by Operator      Sample type ARTERIAL     PREPARE RBC (CROSSMATCH)     Status: Normal   Collection Time   11/10/12  9:10 PM      Component Value Range Comment   Order Confirmation ORDER PROCESSED BY BLOOD BANK       Dg Chest Portable 1 View  11/10/2012  *RADIOLOGY REPORT*  Clinical Data: Left central line placement.  Altered mental status. Cough and chest congestion.  PORTABLE CHEST - 1 VIEW 8:26 p.m.  Comparison: Chest x-ray dated 11/10/2012 at 7:52 p.m.  Findings: Endotracheal tube in good position.  Central venous catheter tip appears to be in the superior vena cava at the level of the azygos vein.  Increasing pulmonary vascular congestion and pulmonary edema or pneumonia with consolidation in the left perihilar region.  IMPRESSION: Central line appears in good position.  No pneumothorax. Progressive bilateral infiltrates.   Original Report Authenticated By: Lorriane Shire, M.D.    Dg Chest Port 1 View  11/10/2012  *RADIOLOGY REPORT*  Clinical Data: Post intubation  PORTABLE CHEST - 1 VIEW  Comparison: 11/10/2012 at 1822 hours  Findings: Endotracheal tube terminates 3 cm above the carina.  Cardiomegaly with suspected mild interstitial edema.  Mild patchy  retrocardiac/left lower lobe opacity, atelectasis versus pneumonia.  No pleural effusion or pneumothorax.  Enteric tube courses below the diaphragm.  IMPRESSION: Endotracheal tube terminates 3 cm above the carina.  Cardiomegaly with suspected mild interstitial edema.  Mild left lower lobe opacity, atelectasis versus pneumonia.   Original Report Authenticated  By: Julian Hy, M.D.    Dg Chest Port 1 View  11/10/2012  *RADIOLOGY REPORT*  Clinical Data: Altered mental status, cough, congestion  PORTABLE CHEST - 1 VIEW  Comparison: 08/16/2012  Findings: Cardiomegaly with pulmonary vascular congestion.  No frank interstitial edema.  Patchy left lower lobe opacity, atelectasis versus pneumonia.  No pleural effusion or pneumothorax.  IMPRESSION: Patchy left lower lobe opacity, atelectasis versus pneumonia.  Cardiomegaly with pulmonary vascular congestion.  No frank interstitial edema.   Original Report Authenticated By: Julian Hy, M.D.     @ROS @ Blood pressure 71/43, pulse 63, temperature 98.6 F (37 C), temperature source Oral, resp. rate 22, height 5' 6.14" (1.68 m), weight 166.3 kg (366 lb 10 oz), SpO2 100.00%. @PHYSEXAMBYAGE2 @ Physical Examination: General appearance - entub, nonresponsive, massive obesity Mental status - as above Eyes - pinpoint pupils, cannot see fundi Mouth - mucous membranes moist, pharynx normal without lesions and thick whitish secretions Neck - adenopathy noted PCL, short , obese Lymphatics - PCL Chest - decreased BS, rhonchi 7 rales Heart - S1 and S2 normal, diminished HS, PMI 12 cm lat to MSL Abdomen - no bs, obese, liver down 6 cm Neurological - non responsive, no reflexes Extremities - pedal edema 2 +, peripheral pulses abnormal absent DP Skin - dry, scars under both arms and groins.  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  Assessment/Plan: 1 AKI  secondary to sepsis syndrome and hypoperfusion, cannot r/o obstruction esp with pyuria. K ^, tx with Kayexalate. Give HCO3 ,vol, and Lasix trial to see if will make urine, but doubt.  If cont, will need CVVHD in am. 2 ^ K 3 Acidemia 4 Massive obesity 5 Sepsis Urine vs Hydradenitis on AB 6 DM will monitor 7 Anemia will check Fe , give 1 Unit PRBC 8 HPTH check level and get records from office. 9 Hydradenitis 10 Hypothyroid replace IV 11 Renal Stones need to make sure not recurrent  P IVF with bicarb, Kayex, Lasix, U/S, follow K, O2, vent , AB, DM control, AB  [name] L 11/10/2012, 10:11 PM

## 2012-11-10 NOTE — Progress Notes (Signed)
ANTIBIOTIC CONSULT NOTE - INITIAL  Pharmacy Consult for Vancomycin Indication: rule out pneumonia and hidradenitis  Allergies  Allergen Reactions  . Amoxicillin-Pot Clavulanate Diarrhea  . Rosiglitazone Maleate     REACTION: swelling  . Amoxicillin Rash    Patient Measurements: Height: 5' 6.14" (168 cm) Weight: 366 lb 10 oz (166.3 kg) IBW/kg (Calculated) : 59.63   Vital Signs: Temp: 98.8 F (37.1 C) (12/15 1801) Temp src: Oral (12/15 1801) BP: 92/66 mmHg (12/15 2055) Pulse Rate: 70  (12/15 2055) Intake/Output from previous day:   Intake/Output from this shift:    Labs:  [facility] 11/10/12 1724  WBC 32.0*  HGB 6.6*  PLT 311  LABCREA --  CREATININE 8.98*   The CrCl is unknown because both a height and weight (above a minimum accepted value) are required for this calculation. No results found for this basename: VANCOTROUGH:2,VANCOPEAK:2,VANCORANDOM:2,GENTTROUGH:2,GENTPEAK:2,GENTRANDOM:2,TOBRATROUGH:2,TOBRAPEAK:2,TOBRARND:2,AMIKACINPEAK:2,AMIKACINTROU:2,AMIKACIN:2, in the last 72 hours   Microbiology: No results found for this or any previous visit (from the past 720 hour(s)).  Medical History: Past Medical History  Diagnosis Date  . Systolic congestive heart failure   . SOB (shortness of breath)   . HTN (hypertension)   . DM type 2 (diabetes mellitus, type 2)   . Chronic kidney disease (CKD), stage III (moderate)   . Iron deficiency anemia   . Morbid obesity   . Hyperkalemia   . Hypothyroidism   . CHF (congestive heart failure)     Medications:  Scheduled:    . dextrose  1 ampule Intravenous Once  . etomidate      . heparin  5,000 Units Subcutaneous Q8H  . insulin aspart  10 Units Intravenous Once  . insulin aspart  2-6 Units Subcutaneous Q4H  . [COMPLETED] ketamine  200 mg Intravenous Once  . lidocaine (cardiac) 100 mg/55ml      . midazolam  4 mg Intravenous Once  . norepinephrine      . pantoprazole (PROTONIX) IV  40 mg Intravenous QHS  . sodium  polystyrene  30 g Per Tube Once  . succinylcholine      . [COMPLETED] vancomycin  1,000 mg Intravenous Once  . vancomycin  1,000 mg Intravenous Once   Assessment: [name] is admitted with suspected PNA/hydradenitis. Noted hx of CKD, Scr currently 8.98, WBC elevated at 32. Noted patient received Vanc 1gm IV at 8pm today and Zosyn at 9pm.  Goal of Therapy:  Vancomycin trough level 15-20 mcg/ml  Plan:  - Will give Vancomycin 1500mg  x 1 to complete 2.5gm load - Will f/up SCr in AM and adjust subsequent Vanc dosing accordingly. - Will monitor cx/spec/sens, renal fn and clinical status daily.  Thanks, Vint Pola K. Posey Pronto, PharmD, BCPS.  Clinical Pharmacist Pager [phone]. 11/10/2012 9:23 PM

## 2012-11-11 ENCOUNTER — Inpatient Hospital Stay (HOSPITAL_COMMUNITY): Payer: BC Managed Care – PPO

## 2012-11-11 DIAGNOSIS — D649 Anemia, unspecified: Secondary | ICD-10-CM

## 2012-11-11 DIAGNOSIS — I5023 Acute on chronic systolic (congestive) heart failure: Secondary | ICD-10-CM

## 2012-11-11 DIAGNOSIS — J189 Pneumonia, unspecified organism: Secondary | ICD-10-CM

## 2012-11-11 DIAGNOSIS — N179 Acute kidney failure, unspecified: Secondary | ICD-10-CM

## 2012-11-11 DIAGNOSIS — A419 Sepsis, unspecified organism: Secondary | ICD-10-CM

## 2012-11-11 LAB — POCT I-STAT 7, (LYTES, BLD GAS, ICA,H+H)
Bicarbonate: 10.1 mEq/L — ABNORMAL LOW (ref 20.0–24.0)
Hemoglobin: 9.5 g/dL — ABNORMAL LOW (ref 12.0–15.0)
O2 Saturation: 87 %
Patient temperature: 99.2
TCO2: 12 mmol/L (ref 0–100)
pCO2 arterial: 53.1 mmHg — ABNORMAL HIGH (ref 35.0–45.0)
pH, Arterial: 6.889 — CL (ref 7.350–7.450)

## 2012-11-11 LAB — PHOSPHORUS: Phosphorus: 6.1 mg/dL — ABNORMAL HIGH (ref 2.3–4.6)

## 2012-11-11 LAB — COMPREHENSIVE METABOLIC PANEL
AST: 14 U/L (ref 0–37)
BUN: 84 mg/dL — ABNORMAL HIGH (ref 6–23)
BUN: 85 mg/dL — ABNORMAL HIGH (ref 6–23)
CO2: 23 mEq/L (ref 19–32)
CO2: 14 mEq/L — ABNORMAL LOW (ref 19–32)
Calcium: 7.6 mg/dL — ABNORMAL LOW (ref 8.4–10.5)
Chloride: 96 mEq/L (ref 96–112)
Creatinine, Ser: 8.43 mg/dL — ABNORMAL HIGH (ref 0.50–1.10)
Creatinine, Ser: 8.89 mg/dL — ABNORMAL HIGH (ref 0.50–1.10)
GFR calc Af Amer: 6 mL/min — ABNORMAL LOW (ref >90)
GFR calc non Af Amer: 5 mL/min — ABNORMAL LOW (ref >90)
GFR calc non Af Amer: 5 mL/min — ABNORMAL LOW (ref >90)
Glucose, Bld: 84 mg/dL (ref 70–99)
Total Bilirubin: 1.2 mg/dL (ref 0.3–1.2)

## 2012-11-11 LAB — RENAL FUNCTION PANEL
CO2: 17 mEq/L — ABNORMAL LOW (ref 19–32)
Calcium: 7.3 mg/dL — ABNORMAL LOW (ref 8.4–10.5)
Chloride: 96 mEq/L (ref 96–112)
GFR calc Af Amer: 7 mL/min — ABNORMAL LOW (ref >90)
Glucose, Bld: 164 mg/dL — ABNORMAL HIGH (ref 70–99)
Sodium: 132 mEq/L — ABNORMAL LOW (ref 135–145)

## 2012-11-11 LAB — CBC WITH DIFFERENTIAL/PLATELET
Basophils Relative: 0 % (ref 0–1)
Basophils Relative: 0 % (ref 0–1)
Eosinophils Absolute: 0 10*3/uL (ref 0.0–0.7)
Eosinophils Absolute: 0 10*3/uL (ref 0.0–0.7)
HCT: 23.9 % — ABNORMAL LOW (ref 36.0–46.0)
Hemoglobin: 7.1 g/dL — ABNORMAL LOW (ref 12.0–15.0)
Hemoglobin: 7.5 g/dL — ABNORMAL LOW (ref 12.0–15.0)
Lymphocytes Relative: 2 % — ABNORMAL LOW (ref 12–46)
MCH: 27.3 pg (ref 26.0–34.0)
MCHC: 29.3 g/dL — ABNORMAL LOW (ref 30.0–36.0)
MCHC: 31.4 g/dL (ref 30.0–36.0)
Monocytes Absolute: 1.8 10*3/uL — ABNORMAL HIGH (ref 0.1–1.0)
Neutro Abs: 34.4 10*3/uL — ABNORMAL HIGH (ref 1.7–7.7)
Neutrophils Relative %: 93 % — ABNORMAL HIGH (ref 43–77)
RBC: 2.61 MIL/uL — ABNORMAL LOW (ref 3.87–5.11)
WBC Morphology: INCREASED
WBC: 38.2 10*3/uL — ABNORMAL HIGH (ref 4.0–10.5)

## 2012-11-11 LAB — GLUCOSE, CAPILLARY
Glucose-Capillary: 94 mg/dL (ref 70–99)
Glucose-Capillary: 94 mg/dL (ref 70–99)

## 2012-11-11 LAB — IRON AND TIBC
Iron: 22 ug/dL — ABNORMAL LOW (ref 42–135)
Saturation Ratios: 11 % — ABNORMAL LOW (ref 20–55)

## 2012-11-11 LAB — POCT I-STAT 3, ART BLOOD GAS (G3+)
Acid-base deficit: 13 mmol/L — ABNORMAL HIGH (ref 0.0–2.0)
Acid-base deficit: 22 mmol/L — ABNORMAL HIGH (ref 0.0–2.0)
Bicarbonate: 13.7 mEq/L — ABNORMAL LOW (ref 20.0–24.0)
O2 Saturation: 88 %
O2 Saturation: 99 %
TCO2: 11 mmol/L (ref 0–100)
TCO2: 18 mmol/L (ref 0–100)
pCO2 arterial: 34.8 mmHg — ABNORMAL LOW (ref 35.0–45.0)
pCO2 arterial: 35.8 mmHg (ref 35.0–45.0)
pCO2 arterial: 52.3 mmHg — ABNORMAL HIGH (ref 35.0–45.0)
pH, Arterial: 7.191 — CL (ref 7.350–7.450)
pO2, Arterial: 115 mmHg — ABNORMAL HIGH (ref 80.0–100.0)
pO2, Arterial: 173 mmHg — ABNORMAL HIGH (ref 80.0–100.0)
pO2, Arterial: 93 mmHg (ref 80.0–100.0)

## 2012-11-11 LAB — CBC
HCT: 25.6 % — ABNORMAL LOW (ref 36.0–46.0)
Hemoglobin: 7.1 g/dL — ABNORMAL LOW (ref 12.0–15.0)
MCH: 27.7 pg (ref 26.0–34.0)
MCH: 27.8 pg (ref 26.0–34.0)
MCHC: 30.5 g/dL (ref 30.0–36.0)
MCV: 91.1 fL (ref 78.0–100.0)
Platelets: 303 10*3/uL (ref 150–400)
RBC: 2.56 MIL/uL — ABNORMAL LOW (ref 3.87–5.11)
RDW: 16.9 % — ABNORMAL HIGH (ref 11.5–15.5)
WBC: 37.1 10*3/uL — ABNORMAL HIGH (ref 4.0–10.5)

## 2012-11-11 LAB — LACTIC ACID, PLASMA: Lactic Acid, Venous: 11.3 mmol/L — ABNORMAL HIGH (ref 0.5–2.2)

## 2012-11-11 LAB — PREPARE RBC (CROSSMATCH)

## 2012-11-11 LAB — CREATININE, URINE, RANDOM: Creatinine, Urine: 41.73 mg/dL

## 2012-11-11 LAB — SODIUM, URINE, RANDOM: Sodium, Ur: 129 mEq/L

## 2012-11-11 MED ORDER — FENTANYL BOLUS VIA INFUSION
25.0000 ug | Freq: Four times a day (QID) | INTRAVENOUS | Status: DC | PRN
  Administered 2012-11-13 (×3): 100 ug via INTRAVENOUS
  Filled 2012-11-11 (×2): qty 100

## 2012-11-11 MED ORDER — PRISMASOL BGK 0/2.5 32-2.5 MEQ/L IV SOLN
INTRAVENOUS | Status: DC
  Filled 2012-11-11 (×6): qty 5000

## 2012-11-11 MED ORDER — SODIUM CHLORIDE 0.9 % IV SOLN
250.0000 mg | Freq: Four times a day (QID) | INTRAVENOUS | Status: DC
  Administered 2012-11-11 – 2012-11-12 (×4): 250 mg via INTRAVENOUS
  Filled 2012-11-11 (×8): qty 250

## 2012-11-11 MED ORDER — SODIUM CHLORIDE 0.9 % IJ SOLN
250.0000 [IU]/h | INTRAMUSCULAR | Status: DC
  Filled 2012-11-11: qty 2

## 2012-11-11 MED ORDER — BIOTENE DRY MOUTH MT LIQD
15.0000 mL | Freq: Four times a day (QID) | OROMUCOSAL | Status: DC
  Administered 2012-11-11 – 2012-11-20 (×36): 15 mL via OROMUCOSAL

## 2012-11-11 MED ORDER — STERILE WATER FOR INJECTION IV SOLN
INTRAVENOUS | Status: DC
  Administered 2012-11-11 (×2): via INTRAVENOUS
  Filled 2012-11-11 (×8): qty 850

## 2012-11-11 MED ORDER — SODIUM CHLORIDE 0.9 % FOR CRRT: INTRAVENOUS_CENTRAL | Status: DC | PRN

## 2012-11-11 MED ORDER — HEPARIN SODIUM (PORCINE) 1000 UNIT/ML DIALYSIS
1000.0000 [IU] | INTRAMUSCULAR | Status: DC | PRN
  Administered 2012-11-12: 3000 [IU] via INTRAVENOUS_CENTRAL
  Filled 2012-11-11: qty 3
  Filled 2012-11-11: qty 6

## 2012-11-11 MED ORDER — FENTANYL CITRATE 0.05 MG/ML IJ SOLN
25.0000 ug/h | INTRAMUSCULAR | Status: DC
  Administered 2012-11-12: 100 ug/h via INTRAVENOUS
  Administered 2012-11-13: 300 ug/h via INTRAVENOUS
  Administered 2012-11-14 – 2012-11-18 (×5): 150 ug/h via INTRAVENOUS
  Filled 2012-11-11 (×12): qty 50

## 2012-11-11 MED ORDER — CHLORHEXIDINE GLUCONATE 0.12 % MT SOLN
15.0000 mL | Freq: Two times a day (BID) | OROMUCOSAL | Status: DC
  Administered 2012-11-11 – 2012-11-19 (×17): 15 mL via OROMUCOSAL
  Filled 2012-11-11 (×14): qty 15

## 2012-11-11 MED ORDER — HEPARIN SODIUM (PORCINE) 1000 UNIT/ML IJ SOLN
INTRAMUSCULAR | Status: AC
  Filled 2012-11-11: qty 3

## 2012-11-11 MED ORDER — PRISMASOL BGK 0/2.5 32-2.5 MEQ/L IV SOLN
INTRAVENOUS | Status: DC
  Filled 2012-11-11 (×2): qty 5000

## 2012-11-11 MED ORDER — PRISMASOL BGK 0/2.5 32-2.5 MEQ/L IV SOLN
INTRAVENOUS | Status: DC
  Administered 2012-11-11: 11:00:00 via INTRAVENOUS_CENTRAL
  Filled 2012-11-11 (×2): qty 5000

## 2012-11-11 MED ORDER — SODIUM POLYSTYRENE SULFONATE 15 GM/60ML PO SUSP
90.0000 g | Freq: Once | ORAL | Status: AC
  Administered 2012-11-11: 90 g
  Filled 2012-11-11: qty 360

## 2012-11-11 MED ORDER — HEPARIN (PORCINE) 2000 UNITS/L FOR CRRT
INTRAVENOUS_CENTRAL | Status: DC | PRN
  Administered 2012-11-16: 1000 mL via INTRAVENOUS_CENTRAL
  Filled 2012-11-11: qty 1000

## 2012-11-11 MED ORDER — HEPARIN BOLUS VIA INFUSION (CRRT)
1000.0000 [IU] | INTRAVENOUS | Status: DC | PRN
  Filled 2012-11-11: qty 1000

## 2012-11-11 MED ORDER — SODIUM CHLORIDE 0.9 % IV SOLN
250.0000 mg | Freq: Two times a day (BID) | INTRAVENOUS | Status: DC
  Filled 2012-11-11 (×2): qty 250

## 2012-11-11 MED ORDER — PRISMASOL BGK 4/2.5 32-4-2.5 MEQ/L IV SOLN
INTRAVENOUS | Status: DC
  Administered 2012-11-11 – 2012-11-17 (×13): via INTRAVENOUS_CENTRAL
  Filled 2012-11-11 (×14): qty 5000

## 2012-11-11 MED ORDER — LEVOFLOXACIN IN D5W 250 MG/50ML IV SOLN
250.0000 mg | INTRAVENOUS | Status: DC
  Filled 2012-11-11: qty 50

## 2012-11-11 MED ORDER — PRISMASOL BGK 0/2.5 32-2.5 MEQ/L IV SOLN
INTRAVENOUS | Status: DC
  Administered 2012-11-11 (×3): via INTRAVENOUS_CENTRAL
  Filled 2012-11-11 (×9): qty 5000

## 2012-11-11 MED ORDER — PIPERACILLIN-TAZOBACTAM IN DEX 2-0.25 GM/50ML IV SOLN
2.2500 g | Freq: Three times a day (TID) | INTRAVENOUS | Status: DC
  Filled 2012-11-11 (×3): qty 50

## 2012-11-11 MED ORDER — PRISMASOL BGK 0/2.5 32-2.5 MEQ/L IV SOLN
INTRAVENOUS | Status: DC
  Filled 2012-11-11 (×3): qty 5000

## 2012-11-11 MED ORDER — PRISMASOL BGK 4/2.5 32-4-2.5 MEQ/L IV SOLN
INTRAVENOUS | Status: DC
  Administered 2012-11-11 – 2012-11-17 (×6): via INTRAVENOUS_CENTRAL
  Filled 2012-11-11 (×10): qty 5000

## 2012-11-11 MED ORDER — HEPARIN SODIUM (PORCINE) 1000 UNIT/ML DIALYSIS
1000.0000 [IU] | INTRAMUSCULAR | Status: DC | PRN
  Filled 2012-11-11: qty 6

## 2012-11-11 MED ORDER — CHLORHEXIDINE GLUCONATE 0.12 % MT SOLN
OROMUCOSAL | Status: AC
  Administered 2012-11-11: 15 mL via OROMUCOSAL
  Filled 2012-11-11: qty 15

## 2012-11-11 MED ORDER — LEVOFLOXACIN IN D5W 750 MG/150ML IV SOLN
750.0000 mg | INTRAVENOUS | Status: DC
  Administered 2012-11-11: 750 mg via INTRAVENOUS
  Filled 2012-11-11: qty 150

## 2012-11-11 MED ORDER — PRISMASOL BGK 4/2.5 32-4-2.5 MEQ/L IV SOLN
INTRAVENOUS | Status: DC
  Administered 2012-11-11 – 2012-11-17 (×38): via INTRAVENOUS_CENTRAL
  Filled 2012-11-11 (×64): qty 5000

## 2012-11-11 NOTE — Procedures (Signed)
Central Venous Catheter Insertion Procedure Note [name] [id number] [birth year]  Procedure: Insertion of Central Venous Catheter Indications: Dialysis for severe hyperkalemia.  Procedure Details Consent: Unable to obtain consent because of emergent medical necessity. Time Out: Verified patient identification, verified procedure, site/side was marked, verified correct patient position, special equipment/implants available, medications/allergies/relevent history reviewed, required imaging and test results available.  Performed  Maximum sterile technique was used including antiseptics, cap, gloves, gown, hand hygiene, mask and sheet. Skin prep: Chlorhexidine; local anesthetic administered Using the Seldinger technique and under direct visualization with the ultrasound, the right IJ vein was accessed with the finder needle. The guide wire was advanced only 13 cm and couldn't be advanced further. Several attempt were done accessing the right IJ at different sites on the neck but we were not successfull advancing the guide wire more than 13 cm. The procedure was aborted. We decided not to attempt a subclavian approach due to large amounts of adipose tissue and poor landmarks. The patient has a left IJ CVC with 3 pressors going. Replacing the right IJ triple lumen catheter for a  dialysis catheter would be to risky given that the patient is on high doses of vasopressors. A femoral approach is also contraindicated given the presence of active suppurative hidradenitis in the groin and perineum. We will continue to manage her hyperkalemia with calcium chloride, bicarbonate and kayexalate.  The family was updated.  [name], M.D. Pulmonary and Critical Care Medicine Call [place] with questions [phone] 11/11/2012, 1:29 AM

## 2012-11-11 NOTE — Progress Notes (Signed)
ANTIBIOTIC CONSULT NOTE - FOLLOW UP  Pharmacy Consult for Vancomycin and Zosyn Indication: pneumonia/ hidradenitis  Allergies  Allergen Reactions  . Amoxicillin-Pot Clavulanate Diarrhea  . Rosiglitazone Maleate     REACTION: swelling  . Amoxicillin Rash    Patient Measurements: Height: 5' 6.14" (168 cm) Weight: 378 lb 12 oz (171.8 kg) IBW/kg (Calculated) : 59.63  Adjusted Body Weight: 104.3kg  Vital Signs: Temp: 98.1 F (36.7 C) (12/16 0751) Temp src: Oral (12/16 0751) BP: 95/18 mmHg (12/16 0500) Pulse Rate: 73  (12/16 0600)   Labs:  Basename 11/11/12 0500 11/11/12 0120 11/11/12 0011 11/11/12 0010 11/10/12 1724  WBC 50.5* 37.1* -- 38.2* --  HGB 7.8* 7.1* 9.5* -- --  PLT 290 303 -- 294 --  LABCREA -- -- -- -- --  CREATININE 8.89* -- -- 8.43* 8.98*   Estimated Creatinine Clearance: 12.5 ml/min (by C-G formula based on Cr of 8.89).  Microbiology: Recent Results (from the past 720 hour(s))  CULTURE, BLOOD (ROUTINE X 2)     Status: Normal (Preliminary result)   Collection Time   11/10/12  6:45 PM      Component Value Range Status Comment   Specimen Description BLOOD RIGHT HAND   Final    Special Requests BOTTLES DRAWN AEROBIC AND ANAEROBIC 10CC EA   Final    Culture  Setup Time 11/10/2012 22:45   Final    Culture     Final    Value:        BLOOD CULTURE RECEIVED NO GROWTH TO DATE CULTURE WILL BE HELD FOR 5 DAYS BEFORE ISSUING A FINAL NEGATIVE REPORT   Report Status PENDING   Incomplete   CULTURE, BLOOD (ROUTINE X 2)     Status: Normal (Preliminary result)   Collection Time   11/10/12  7:00 PM      Component Value Range Status Comment   Specimen Description BLOOD RIGHT HAND   Final    Special Requests BOTTLES DRAWN AEROBIC AND ANAEROBIC 10CC EA   Final    Culture  Setup Time 11/10/2012 22:45   Final    Culture     Final    Value:        BLOOD CULTURE RECEIVED NO GROWTH TO DATE CULTURE WILL BE HELD FOR 5 DAYS BEFORE ISSUING A FINAL NEGATIVE REPORT   Report Status  PENDING   Incomplete   MRSA PCR SCREENING     Status: Normal   Collection Time   11/10/12 10:45 PM      Component Value Range Status Comment   MRSA by PCR NEGATIVE  NEGATIVE Final     Anti-infectives     Start     Dose/Rate Route Frequency Ordered Stop   11/10/12 2230   vancomycin (VANCOCIN) 1,500 mg in sodium chloride 0.9 % 500 mL IVPB        1,500 mg 250 mL/hr over 120 Minutes Intravenous  Once 11/10/12 2227 11/11/12 0053   11/10/12 2115   vancomycin (VANCOCIN) IVPB 1000 mg/200 mL premix  Status:  Discontinued        1,000 mg 200 mL/hr over 60 Minutes Intravenous  Once 11/10/12 2107 11/10/12 2227   11/10/12 1830  piperacillin-tazobactam (ZOSYN) IVPB 3.375 g       3.375 g 12.5 mL/hr over 240 Minutes Intravenous  Once 11/10/12 1825 11/11/12 0118   11/10/12 1830   vancomycin (VANCOCIN) IVPB 1000 mg/200 mL premix        1,000 mg 200 mL/hr over 60 Minutes Intravenous  Once 11/10/12 1825 11/10/12 2104          Assessment: 36 YOF with history of CKD and 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 Presented to ED on 12/15 with SOB and decreasing mental status. CXR showed LLL pneumonia Was given vancomycin 1.5gm IV in the ED and then the vancomycin load was completed with an additional 1gm IV. She also has been given 1 dose of Zosyn 3.375gm IV. She is intubated and is hyperkalemic. Unable to place catheter for dialysis. K is currently 6.8, SCr is 8.89 and CrCl is ~72mL/min. WBC 50.5 and she is afebrile. No growth to date on any cultures.  Goal of Therapy:  Vancomycin trough level 15-20 mcg/ml Eradication of infection Proper antibiotic dosing for renal function  Plan: 1. Zosyn 2.25gm IV q8hr 2. Based on renal function and obesity nomogram, would need Q48 dosing of vancomycin. Therefore, no need for a dose today. 3. Follow up renal function and dialysis plans and adjust dosing as needed 4. Follow up cultures and  sensitivities as well as clinical progression  Jishnu Jenniges D. Ellianne Gowen, PharmD Clinical Pharmacist Pager: [phone] Phone: [phone] 11/11/2012 8:49 AM

## 2012-11-11 NOTE — Progress Notes (Signed)
CRITICAL VALUE ALERT  Critical value received:  Blood cultures from 11/10/12 Gram negative rods in both sets  Date of notification:  11/11/12  Time of notification:  10:02 AM  Critical value read back:yes  Nurse who received alert:  Vicente Masson RN  MD notified (1st page):  Titus Mould  Time of first page:  10:05 AM  MD notified (2nd page):  Time of second page:  Responding MD:  Titus Mould   Time MD responded:  10:05 AM

## 2012-11-11 NOTE — Progress Notes (Signed)
Patient ID: [name], female   DOB: Apr 18, 1961, 51 y.o.   MRN: [id number] Worsening clinical status. [name] Kitchen despite current therapy.  CCM attempting dialysis cath insertion.  Will start CVVHD, keep even, with goals of acid, K, and solute clearance.

## 2012-11-11 NOTE — Progress Notes (Signed)
  Echocardiogram 2D Echocardiogram has been performed.  [name] 11/11/2012, 11:12 AM

## 2012-11-11 NOTE — Progress Notes (Signed)
Subjective:  Patient intubated On 2 pressors plus dobutamine Making no urine CCM could not get dialysis catheter in last night Getting bicarb drip Received 1 unit of blood Lost "some blood" with attempt at line placement  Objective Vital signs in last 24 hours: Filed Vitals:   11/11/12 0600 11/11/12 0700 11/11/12 0751 11/11/12 0800  BP:      Pulse: 73     Temp:   98.1 F (36.7 C)   TempSrc:   Oral   Resp: 29 32  33  Height:      Weight:      SpO2: 96%      Weight change:   Intake/Output Summary (Last 24 hours) at 11/11/12 0917 Last data filed at 11/11/12 0800  Gross per 24 hour  Intake 3440.07 ml  Output    720 ml  Net 2720.07 ml   Physical Exam:  Blood pressure 95/18, pulse 73, temperature 98.1 F (36.7 C), temperature source Oral, resp. rate 33, height 5' 6.14" (1.68 m), weight 171.8 kg (378 lb 12 oz), SpO2 96.00%. CVP 29 Morbidly obese female Intubated Old port scar right chest Anterior lungs clear Heart sounds distant Massive pannus with induration, peau d'orange type look; cannot hear definite BS Not responsive to command 1-2+ edema  Labs: Basic Metabolic Panel:  Lab [id number] 11/11/12 0011 11/11/12 0010 11/10/12 1724  NA 129* 133* 144 129*  K 6.8* 7.4* 6.5* 5.7*  CL 96 -- 99 98  CO2 14* -- 23 16*  GLUCOSE 153* -- 84 137*  BUN 85* -- 84* 86*  CREATININE 8.89* -- 8.43* 8.98*  ALB -- -- -- --  CALCIUM 7.7* -- 7.6* 7.6*  PHOS 6.1* -- -- --   Liver Function Tests:  Lab 11/11/12 0500 11/11/12 0010  AST 14 8  ALT <5 <5  ALKPHOS 164* 174*  BILITOT 1.2 0.5  PROT 8.3 7.7  ALBUMIN 2.1* 1.9*  CBC:  Lab 11/11/12 0500 11/11/12 0120 11/11/12 0011 11/11/12 0010 11/10/12 1724  WBC 50.5* 37.1* -- 38.2* 32.0*  NEUTROABS -- -- -- 35.5* 30.7*  HGB 7.8* 7.1* 9.5* 7.1* --  HCT 25.6* 23.5* 28.0* 24.2* --  MCV 91.1 91.8 -- 92.7 92.2  PLT 290 303 -- 294 311   Lab 11/10/12 1724  CKTOTAL --  CKMB --  CKMBINDEX --  TROPONINI <0.30   CBG:  Lab  11/11/12 0750 11/11/12 0356 11/10/12 2243  GLUCAP 143* 9494 108*   Studies/Results: US Renal  11/11/2012  *RADIOLOGY REPORT*  Clinical Data: CKD, ARF.  RENAL/URINARY TRACT ULTRASOUND COMPLETE  Comparison:  08/21/2012  Findings: Technically challenging examination due to patient body habitus, ascites, and limited positioning.  Right Kidney:  Poorly visualized.  There may be mild pelvicaliectasis.  Left Kidney:  Not visualized  Bladder:  Not visualized  Moderate amount of ascites.  IMPRESSION: Suboptimal examination. There may be mild pelvicaliectasis on the right.  Left kidney not visualized.   Original Report Authenticated By: Carlos Levering, M.D.    Dg Chest Portable 1 View  11/10/2012  *RADIOLOGY REPORT*  Clinical Data: Left central line placement.  Altered mental status. Cough and chest congestion.  PORTABLE CHEST - 1 VIEW 8:26 p.m.  Comparison: Chest x-ray dated 11/10/2012 at 7:52 p.m.  Findings: Endotracheal tube in good position.  Central venous catheter tip appears to be in the superior vena cava at the level of the azygos vein.  Increasing pulmonary vascular congestion and pulmonary edema or pneumonia with consolidation in the left perihilar region.  IMPRESSION: Central line appears in good position.  No pneumothorax. Progressive bilateral infiltrates.   Original Report Authenticated By: Lorriane Shire, M.D.    Dg Chest Port 1 View  11/10/2012  *RADIOLOGY REPORT*  Clinical Data: Post intubation  PORTABLE CHEST - 1 VIEW  Comparison: 11/10/2012 at 1822 hours  Findings: Endotracheal tube terminates 3 cm above the carina.  Cardiomegaly with suspected mild interstitial edema.  Mild patchy retrocardiac/left lower lobe opacity, atelectasis versus pneumonia.  No pleural effusion or pneumothorax.  Enteric tube courses below the diaphragm.  IMPRESSION: Endotracheal tube terminates 3 cm above the carina.  Cardiomegaly with suspected mild interstitial edema.  Mild left lower lobe opacity, atelectasis  versus pneumonia.   Original Report Authenticated By: Julian Hy, M.D.    Dg Chest Port 1 View  11/10/2012  *RADIOLOGY REPORT*  Clinical Data: Altered mental status, cough, congestion  PORTABLE CHEST - 1 VIEW  Comparison: 08/16/2012  Findings: Cardiomegaly with pulmonary vascular congestion.  No frank interstitial edema.  Patchy left lower lobe opacity, atelectasis versus pneumonia.  No pleural effusion or pneumothorax.  IMPRESSION: Patchy left lower lobe opacity, atelectasis versus pneumonia.  Cardiomegaly with pulmonary vascular congestion.  No frank interstitial edema.   Original Report Authenticated By: Julian Hy, M.D.    Medications:    . norepinephrine (LEVOPHED) Adult infusion 30 mcg/min (11/11/12 0800)  .  sodium bicarbonate infusion sterile water 1000 mL 150 mL/hr at 11/11/12 0300  . vasopressin (PITRESSIN) infusion - *FOR SHOCK* 0.03 Units/min (11/11/12 0000)      . antiseptic oral rinse  15 mL Mouth Rinse QID  . chlorhexidine  15 mL Mouth Rinse BID  . dextrose  1 ampule Intravenous Once  . heparin      . heparin  5,000 Units Subcutaneous Q8H  . hydrocortisone sod succinate (SOLU-CORTEF) injection  50 mg Intravenous Q6H  . insulin aspart  10 Units Intravenous Once  . insulin aspart  2-6 Units Subcutaneous Q4H  . levofloxacin (LEVAQUIN) IV  750 mg Intravenous Q48H  . midazolam  4 mg Intravenous Once  . pantoprazole (PROTONIX) IV  40 mg Intravenous QHS  . piperacillin-tazobactam (ZOSYN)  IV  2.25 g Intravenous Q8H  . sodium chloride  25 mL/kg Intravenous Once    I  have reviewed scheduled and prn medications. Assessment/Plan:    1. AKI secondary to sepsis syndrome and hypoperfusion (on 2 pressors plus dobutamine)     Cannot r/o obstruction esp with pyuria.     (has had left hydro from a stone in the past; will need CT to evaluate as [place] was not helpful)     No UOP with lasix     Still acidotic and severely hyperkalemic     CCM to try to get catheter in for  CRRT - if we can't accomplish this she will not survive     CRT all 0 potassium prismasate (dialysisate, pre and post infusion fluids; continue peripheral bicarb drip (will make use of bicarbonate easier to adjust)    2  K as above    3 Acidemia on bicarb drip right now; hopefully will correct with addition of  CRRT   4 Massive obesity    5 Sepsis Urine vs Hydradenitis on empiric antibiotics   6 DM per primary    7 Anemia     Iron pending    Got a unit of PRBC's last night   8 HPTH check level (pending)     Will get records from office.  9 Hydradenitis  10 Hypothyroid replace IV  11 Renal Stones need to make sure not recurrent and will need CT to do  Jamal Maes, MD [facility] [phone] pager 11/11/2012, 9:17 AM

## 2012-11-11 NOTE — Code Documentation (Signed)
Called to the bedside for persistent hypotension and bradycardia despite max doses of norepinephrine. Patient on Dobutamine and about to start vasopressin. The patient went into asystole and CPR was started. One dose of epinephrine was given with return to spontaneous circulation and sinus rhythm. One amp of sodium bicarbonate and 1 gr of calcium chloride were given. Repeat potassium is 7.4. Dialysis catheter placement was attempted on the right IJ but I was unable to tread the guidewire. (please see procedure note). The patient has a TLC on the left IJ with pressors going. The subclavian approach is not a feasible option given poor landmarks and large amounts of adipose tissue in the clavicular area. The groin is also not an option due to active suppurative hidradenitis on the groin and perineum area.  Family updated. They are aware that [name] is in very critical condition.  [name], M.D.  Pulmonary and [place]  Pager: [phone]

## 2012-11-11 NOTE — Progress Notes (Signed)
The patient is awake and uncomfortable while on mechanical ventilation; not  At desired RAS with intermittent sedation.   Initiate Fentanyl drip; consider adding continuous versed for the desired RAS

## 2012-11-11 NOTE — Progress Notes (Signed)
11/11/12 0030  Pt arrived to 2115 at 2230. Pt is hypotensive 67/15. Pt is receiving 1 unit of PRBCs and receiving a 1L bolus of LR, but Levophed has not been started per the sepsis shock protocol. Dobutamine is ordered at a set rate of 2.6mcg/kg/min per Elsworth Soho MD for pt coax of 48.8. Pt never received initial sepsis shock fluid bolus. Initial  CVP was 32, fluid boluses and sepsis bolus are d/cd per [place].   At 2350 pt started having ekg changes and pt on 79mcg of Levophed, 2.1mcg/kg/min of Dobutamine and Vasopressin was ordered per [place]. CCM Fellow Siqueiros MD at bedside. Peripheral pulses are absent, but femoral pulses are dopplerable. Upon assessment of pt at 0002 pt went asystole on monitor and no pulse was found. CPR was started, 1 dose of epinephrine was given at [place] . Pt pulse returned at [place] and pt was in 3rd degree AV block. Pt was given 1 amp of BiCarb and 1g of Calcium Chloride.   Post code event pt has orders written for CRRT and HD catheter placement. Unfortunately MD was unable to place HD cath (see MD note). eLink MD stated that CRRT/HD catheter need will be reassessed on 01/13/12 AM shift. Post attempts of HD cath placement, 1 more unit of PRBCs was ordered for a pt Hgb of 7.1.   Family at bedside and aware of pt critical condition. Family declined chaplain at this time.   Will continue to monitor.  Wyn Quaker RN

## 2012-11-11 NOTE — ED Provider Notes (Signed)
Medical screening examination/treatment/procedure(s) were performed by non-physician practitioner and as supervising physician I was immediately available for consultation/collaboration.  Jasper Riling. Alvino Chapel, MD 11/11/12 [phone]

## 2012-11-11 NOTE — Progress Notes (Signed)
CRITICAL VALUE ALERT  Critical value received:  K 6.5  Date of notification:  11/11/12  Time of notification:  0059  Critical value read back:yes  Nurse who received alert:  Leonie Green, RN  MD notified (1st page):  Dr. Edmund Hilda (spoke with at the bedside)  Time of first page:  0107

## 2012-11-11 NOTE — Procedures (Signed)
Central Venous Catheter Insertion Procedure Note Ivona Cooksey [id number] September 22, 1961  Procedure: Insertion of Central Venous Catheter Indications: cvvhd  Procedure Details Consent: Risks of procedure as well as the alternatives and risks of each were explained to the (patient/caregiver).  Consent for procedure obtained. Time Out: Verified patient identification, verified procedure, site/side was marked, verified correct patient position, special equipment/implants available, medications/allergies/relevent history reviewed, required imaging and test results available.  Performed  Maximum sterile technique was used including antiseptics, cap, gloves, gown, hand hygiene, mask and sheet. Skin prep: Chlorhexidine; local anesthetic administered A antimicrobial bonded/coated triple lumen catheter was placed in the right femoral vein due to multiple attempts, no other available access and patient being a dialysis patient using the Seldinger technique.  Evaluation Blood flow good Complications: No apparent complications Patient did tolerate procedure well. Chest X-ray ordered to verify placement.  CXR: not needed.  Raylene Miyamoto 11/11/2012, 11:52 AM  Site not ideal no other opitions, K almost 7 , need emergent HD  Lavon Paganini. Titus Mould, MD, [place] Pgr: [place] [facility]

## 2012-11-11 NOTE — Procedures (Signed)
Central Venous Catheter Insertion Procedure Note [name] [id number] 04-03-1961  Procedure: Insertion of Central Venous Catheter Indications: hd  Procedure Details Consent: Risks of procedure as well as the alternatives and risks of each were explained to the (patient/caregiver).  Consent for procedure obtained. Time Out: Verified patient identification, verified procedure, site/side was marked, verified correct patient position, special equipment/implants available, medications/allergies/relevent history reviewed, required imaging and test results available.  Performed  Maximum sterile technique was used including antiseptics, cap, gloves, gown, hand hygiene, mask and sheet. Skin prep: Chlorhexidine; local anesthetic administered A antimicrobial bonded/coated single lumen catheter was placed in the right internal jugular vein using the Seldinger technique.  Evaluation Blood flow good Complications: No apparent complications Patient did tolerate procedure well. Chest X-ray ordered to verify placement.  CXR: pending.  [name] 11/11/2012, 11:51 AM  Attempted HD cath, not comfortable as wire to 14-16 cm only, placed single port for more acces, not knowing needs  And such limited access  [place]  Lavon Paganini. Titus Mould, MD, [place] Pgr: [place] [facility]

## 2012-11-11 NOTE — H&P (Signed)
Name: [name] MRN: [id number] DOB: 22-Feb-1961    LOS: 1  PULMONARY / CRITICAL CARE MEDICINE  HPI:   51 years old morbid obese female with complex PMH relevant for systolic CHF with LVEF of 35 to 40%, HTN, DM2, hypothyroidism, chronic pain on narcotics and CKD being evaluated by nephrology for dialysis. She also has a complex history of suppurative hidradenitis and is on chronic antibiotic therapy. In icu for PNA, septic shock, ARF.   Abx: 12/15 vanc>>> 12/15 zosyn>>> 12/16 levofloxacin>>>  Cultures: 12/15 BC x 2>>> 12/15 urine>>> 12/15 tracheal >>>  Lines: 12/15 left IJ >>> 12/15  Rt rad>>> 12/15 ETT>>>  Events/ Studeis: 12/15 >>> Arrest 12/15 renal US>>Suboptimal examination. There may be mild pelvicaliectasis on the<BR>right.<BR> <BR>Left kidney not visualized 12/16>>>ARF, shock, needing cvvhd  Vital Signs: Temp:  [98.1 F (36.7 C)-99.3 F (37.4 C)] 98.1 F (36.7 C) (12/16 0751) Pulse Rate:  [49-78] 73  (12/16 0600) Resp:  [0-35] 33  (12/16 0800) BP: (50-115)/(10-91) 95/18 mmHg (12/16 0500) SpO2:  [95 %-100 %] 96 % (12/16 0600) Arterial Line BP: (76-125)/(46-55) 112/55 mmHg (12/16 0600) FiO2 (%):  [15 %-100 %] 50 % (12/16 0800) Weight:  [166.3 kg (366 lb 10 oz)-171.8 kg (378 lb 12 oz)] 171.8 kg (378 lb 12 oz) (12/16 0500)  Physical Examination: General:  Morbid obese, Intubated, mechanically ventilated Neuro:  Sedated, synchronous, rass -4 HEENT:  PERRL 2 mm Neck:  Supple, no JVD   Cardiovascular:  RRR, no M/R/G Lungs:  Bilateral diminished, reduced Abdomen:  Soft, nontender, nondistended, bowel sounds present, obese Musculoskeletal:  Moves all extremities, no pedal edema Skin:  Severe actively suppurating hidradenitis to the perineal and groin area. Also present in the breast folds  Principal Problem:  *Sepsis   ASSESSMENT AND PLAN  PULMONARY  Lab 11/11/12 0351 11/11/12 0011 11/11/12 0010 11/10/12 2207 11/10/12 2105 11/10/12 1758  PHART 7.191*  6.889* 6.890* -- 7.254* 7.177*  PCO2ART 35.8 53.1* 52.3* -- 40.2 50.1*  PO2ART 115.0* 90.0 93.0 -- 339.0* 75.0*  HCO3 13.7* 10.1* 9.9* -- 17.8* 18.6*  O2SAT 97.0 87.0 88.0 48.3 100.0 --   Ventilator Settings: Vent Mode:  [-] PRVC FiO2 (%):  [15 %-100 %] 50 % Set Rate:  [18 bmp] 18 bmp Vt Set:  [500 mL] 500 mL PEEP:  [4.8 cmH20-5 cmH20] 4.8 cmH20 Plateau Pressure:  [31 cmH20-32 cmH20] 32 cmH20 CXR: Bilateral infiltrates with a more dense consolidation in the LLL. ETT:  Adequate position  A:   1) Acute hypoxemic and hypercarbic respiratory failure 2) Bilateral pneumonia in the setting of multiple and frequent antibiotic exposure. 3) acidosis with associated hyeprkalemia P:   -ABg reviewed, in setting of K 6.8, increase rate 30, abg to follow -on min O 2 suport -pcxr reviewed, repeat in am  - Albuterol PRN -MV too high for sbt -abg in am   CARDIOVASCULAR  Lab 11/11/12 0010 11/10/12 2055 11/10/12 1927 11/10/12 1724  TROPONINI -- -- -- <0.30  LATICACIDVEN 11.3* 2.4* 2.53* --  PROBNP -- -- -- 33908.0*   ECG:  NSR Lines: Left IJ in adequate position. 11/10/12  A:  1) Sepsis secondary to bilateral pneumonia 2) Systolic CHF 3) Metabolic acidosis (lactic) secondary to sepsis 4) r/o rel ai, gad a cortisol 8 in march 12  P:  - Sepsis protocol in place -concern is pulm htn known, and elevated cvp, may bolus - levophed to map goals, may need vaso - Echocardiogram pending -trop neg -stress roids mainatin  RENAL  Lab  11/11/12 0500 11/11/12 0011 11/11/12 0010 11/10/12 1724  NA 129* 133* 144 129*  K 6.8* 7.4* -- --  CL 96 -- 99 98  CO2 14* -- 23 16*  BUN 85* -- 84* 86*  CREATININE 8.89* -- 8.43* 8.98*  CALCIUM 7.7* -- 7.6* 7.6*  MG 2.4 -- -- --  PHOS 6.1* -- -- --   Intake/Output      12/15 0701 - 12/16 0700 12/16 0701 - 12/17 0700   I.V. (mL/kg) 2431.8 (14.2) 213.3 (1.2)   Blood 625    NG/GT  30   IV Piggyback 140    Total Intake(mL/kg) 3196.8 (18.6) 243.3 (1.4)    Emesis/NG output 420 300   Total Output 420 300   Net +2776.8 -56.7         Foley:  11/10/12  A:   1) Acute on chronic renal failure 2) Hyperkalemia 3) Hyponatremia 4) Hypocalcemia likely not real. When corrected with last albumin of 2.0 on 08/25/12 is 9.2 P:   - k treated -bicarb drip for K -frequent bmwet -will place HD cath start cvvhd ASAP  GASTROINTESTINAL  Lab 11/11/12 0500 11/11/12 0010  AST 14 8  ALT <5 <5  ALKPHOS 164* 174*  BILITOT 1.2 0.5  PROT 8.3 7.7  ALBUMIN 2.1* 1.9*    A:   1)  obese P:   - GI prophylaxis with protonix -start TF soon, follow output from OGT first  HEMATOLOGIC  Lab 11/11/12 0500 11/11/12 0120 11/11/12 0011 11/11/12 0010 11/10/12 1724  HGB 7.8* 7.1* 9.5* 7.1* 6.6*  HCT 25.6* 23.5* 28.0* 24.2* 22.4*  PLT 290 303 -- 294 311  INR -- -- -- 1.78* --  APTT 39* -- -- -- --   A:   1) Anemia likely due to chronic disease and sepsis P:  - s/p one unit prbc -Cbc to follow and in am  -sub q heparin  INFECTIOUS  Lab 11/11/12 0500 11/11/12 0120 11/11/12 0010 11/10/12 2055 11/10/12 1724  WBC 50.5* 37.1* 38.2* -- 32.0*  PROCALCITON -- -- -- 81.37 --    A:   1) Septic shock - Bilateral pneumonia - Suppurative hidradenitis - UTI P:   - continue vanc At risk re s org as on abx daily, dc zosyn, ad imi -add stat levo for atypical If able consider early bronch bal  ENDOCRINE  Lab 11/11/12 0750 11/11/12 0356 11/10/12 2243  GLUCAP 143* 9494 108*   A:   1) DM 2  2 rel AI r/o P:   - ICU hyperglycemia protocol with SQ Novolog   NEUROLOGIC  A:   1) Altered mental status likely due to sepsis and hypercarbia 2) Intubated, sedated P:   - Continuous held, allow rass to rise - Pain management with PRN Fentanyl - Daily awakening -dc versed off mar -may need ct head  BEST PRACTICE / DISPOSITION - Level of Care:  ICU - Primary Service:  PCCM - Consultants:  Nephrology - Code Status:  Full code - Diet:  NPO - DVT Px:   Heparin sq - GI Px:  Protonix IV - Skin Integrity:  Severe suppurative hidradenitis  - Social / Family:  Family updated ad bedside  The patient is critically ill with multiple organ systems failure and requires high complexity decision making for assessment and support, frequent evaluation and titration of therapies, application of advanced monitoring technologies and extensive interpretation of multiple databases.   Critical Care Time devoted to patient care services described in this note is:  45 min  Lavon Paganini. Titus Mould, MD, [place] Pgr: [place] [facility]

## 2012-11-12 ENCOUNTER — Inpatient Hospital Stay (HOSPITAL_COMMUNITY): Payer: BC Managed Care – PPO

## 2012-11-12 LAB — CBC WITH DIFFERENTIAL/PLATELET
Basophils Relative: 0 % (ref 0–1)
Hemoglobin: 7.3 g/dL — ABNORMAL LOW (ref 12.0–15.0)
Lymphs Abs: 0.4 10*3/uL — ABNORMAL LOW (ref 0.7–4.0)
MCHC: 32.4 g/dL (ref 30.0–36.0)
Monocytes Relative: 4 % (ref 3–12)
Neutro Abs: 33.5 10*3/uL — ABNORMAL HIGH (ref 1.7–7.7)
Neutrophils Relative %: 95 % — ABNORMAL HIGH (ref 43–77)
RBC: 2.65 MIL/uL — ABNORMAL LOW (ref 3.87–5.11)

## 2012-11-12 LAB — ANTISTREPTOLYSIN O TITER: ASO: 519 IU/mL — ABNORMAL HIGH (ref <409)

## 2012-11-12 LAB — RENAL FUNCTION PANEL
Albumin: 1.9 g/dL — ABNORMAL LOW (ref 3.5–5.2)
Chloride: 98 mEq/L (ref 96–112)
GFR calc Af Amer: 10 mL/min — ABNORMAL LOW (ref >90)
GFR calc non Af Amer: 9 mL/min — ABNORMAL LOW (ref >90)
Phosphorus: 3.5 mg/dL (ref 2.3–4.6)
Potassium: 4.5 mEq/L (ref 3.5–5.1)
Sodium: 135 mEq/L (ref 135–145)

## 2012-11-12 LAB — GLUCOSE, CAPILLARY
Glucose-Capillary: 138 mg/dL — ABNORMAL HIGH (ref 70–99)
Glucose-Capillary: 133 mg/dL — ABNORMAL HIGH (ref 70–99)
Glucose-Capillary: 125 mg/dL — ABNORMAL HIGH (ref 70–99)
Glucose-Capillary: 133 mg/dL — ABNORMAL HIGH (ref 70–99)

## 2012-11-12 LAB — BLOOD GAS, ARTERIAL
Acid-base deficit: 1.3 mmol/L (ref 0.0–2.0)
Bicarbonate: 22.8 mEq/L (ref 20.0–24.0)
FIO2: 0.4 %
O2 Saturation: 99.1 %
PEEP: 5 cmH2O
Patient temperature: 98.6
TCO2: 23.9 mmol/L (ref 0–100)

## 2012-11-12 LAB — CBC
Platelets: 174 10*3/uL (ref 150–400)
RBC: 2.42 MIL/uL — ABNORMAL LOW (ref 3.87–5.11)
RDW: 16.8 % — ABNORMAL HIGH (ref 11.5–15.5)
WBC: 28.4 10*3/uL — ABNORMAL HIGH (ref 4.0–10.5)

## 2012-11-12 LAB — COMPREHENSIVE METABOLIC PANEL
CO2: 22 mEq/L (ref 19–32)
Calcium: 7.4 mg/dL — ABNORMAL LOW (ref 8.4–10.5)
Creatinine, Ser: 6.2 mg/dL — ABNORMAL HIGH (ref 0.50–1.10)
GFR calc Af Amer: 8 mL/min — ABNORMAL LOW (ref >90)
GFR calc non Af Amer: 7 mL/min — ABNORMAL LOW (ref >90)
Glucose, Bld: 152 mg/dL — ABNORMAL HIGH (ref 70–99)

## 2012-11-12 LAB — URINE CULTURE: Colony Count: >=100000

## 2012-11-12 LAB — PREPARE RBC (CROSSMATCH)

## 2012-11-12 LAB — PHOSPHORUS: Phosphorus: 4 mg/dL (ref 2.3–4.6)

## 2012-11-12 LAB — POCT I-STAT 3, ART BLOOD GAS (G3+)
Bicarbonate: 24.8 mEq/L — ABNORMAL HIGH (ref 20.0–24.0)
TCO2: 26 mmol/L (ref 0–100)
pCO2 arterial: 34.9 mmHg — ABNORMAL LOW (ref 35.0–45.0)
pH, Arterial: 7.456 — ABNORMAL HIGH (ref 7.350–7.450)

## 2012-11-12 LAB — PARATHYROID HORMONE, INTACT (NO CA): PTH: 662.4 pg/mL — ABNORMAL HIGH (ref 14.0–72.0)

## 2012-11-12 MED ORDER — OXEPA PO LIQD
1000.0000 mL | ORAL | Status: DC
  Administered 2012-11-12 – 2012-11-15 (×3): 1000 mL
  Filled 2012-11-12 (×3): qty 1000

## 2012-11-12 MED ORDER — PRO-STAT SUGAR FREE PO LIQD
60.0000 mL | Freq: Four times a day (QID) | ORAL | Status: DC
  Administered 2012-11-12 – 2012-11-18 (×18): 60 mL
  Filled 2012-11-12 (×27): qty 60

## 2012-11-12 MED ORDER — OXEPA PO LIQD
1000.0000 mL | ORAL | Status: DC
  Filled 2012-11-12 (×2): qty 1000

## 2012-11-12 MED ORDER — DEXTROSE 5 % IV SOLN
1.0000 g | INTRAVENOUS | Status: DC
  Administered 2012-11-12 – 2012-11-13 (×2): 1 g via INTRAVENOUS
  Filled 2012-11-12 (×2): qty 10

## 2012-11-12 MED FILL — Medication: Qty: 1 | Status: AC

## 2012-11-12 NOTE — Progress Notes (Addendum)
Subjective: remains intubated CRRT started yesterday after femoral line placed Pressors weaning  Objective Vital signs in last 24 hours: Filed Vitals:   11/12/12 0645 11/12/12 0700 11/12/12 0715 11/12/12 0800  BP:  107/59  106/54  Pulse: 65 64 64 68  Temp:      TempSrc:      Resp: 21 18 11 30   Height:      Weight:      SpO2: 100% 100% 100% 100%   Weight change: 5.3 kg (11 lb 11 oz)  Intake/Output Summary (Last 24 hours) at 11/12/12 0842 Last data filed at 11/12/12 0800  Gross per 24 hour  Intake   5251 ml  Output   5338 ml  Net    -87 ml   Physical Exam:  Blood pressure 106/54, pulse 68, temperature 94.3 F (34.6 C), temperature source Oral, resp. rate 30, height 5' 6.14" (1.68 m), weight 171.6 kg (378 lb 5 oz), SpO2 100.00%. Opens eyes and grimaces with pain when examining legs and abdomen Lungs anteriorly grossly clear 2+ edema LE's Right femoral HD catheter CVP 18  Labs: Basic Metabolic Panel:  Lab [id number] 0330 11/11/12 1544 11/11/12 0500 11/11/12 0011 11/11/12 0010 11/10/12 1724  NA 131* 132* 129* 133* 144 129*  K 4.5 5.0 6.8* 7.4* 6.5* 5.7*  CL 94* 96 96 -- 99 98  CO2 22 17* 14* -- 23 16*  GLUCOSE 152* 164* 153* -- 84 137*  BUN 64* 76* 85* -- 84* 86*  CREATININE 6.20* 7.54* 8.89* -- 8.43* 8.98*  ALB -- -- -- -- -- --  CALCIUM 7.4* 7.3* 7.7* -- 7.6* 7.6*  PHOS 4.0 5.0* 6.1* -- -- --   Liver Function Tests:  Lab 11/12/12 0330 11/11/12 1544 11/11/12 0500 11/11/12 0010  AST 40* -- 14 8  ALT 7 -- <5 <5  ALKPHOS 141* -- 164* 174*  BILITOT 0.6 -- 1.2 0.5  PROT 8.2 -- 8.3 7.7  ALBUMIN 2.1* 2.1* 2.1* --   No results found for this basename: LIPASE:3,AMYLASE:3 in the last 168 hours No results found for this basename: AMMONIA:3 in the last 168 hours CBC:  Lab 11/12/12 0330 11/11/12 1355 11/11/12 0500 11/11/12 0120 11/11/12 0010 11/10/12 1724  WBC 35.3* 36.6* 50.5* 37.1* -- --  NEUTROABS 33.5* 34.4* -- -- 35.5* 30.7*  HGB 7.3* 7.5* 7.8* 7.1* -- --  HCT  22.5* 23.9* 25.6* 23.5* -- --  MCV 84.9 86.9 91.1 91.8 -- --  PLT 204 276 290 303 -- --   Lab 11/10/12 1724  CKTOTAL --  CKMB --  CKMBINDEX --  TROPONINI <0.30   CBG:  Lab 11/12/12 0740 11/12/12 0331 11/12/12 0043 11/11/12 2011 11/11/12 1548  GLUCAP 139* 133* 138* 124* 147*    Iron Studies:  Lab 11/11/12 1310  IRON 22*  TIBC 201*  TRANSFERRIN --  FERRITIN --   BLOOD RIGHT HAND Special Requests: BOTTLES DRAWN AEROBIC AND ANAEROBIC 10CC EA Culture Setup Time: 11/10/2012 22:45 Culture: GRAM NEGATIVE RODS Note: Gram Stain Report Called to,Read Back By and Verified With: CHRIS MCKEOWN @1009  11/11/12 BY KRAWS Report Status: PENDING  URINE, CATHETERIZED   Special Requests:    NONE   Culture Setup Time:    11/10/2012 23:29   Colony Count:    >=100,000 COLONIES/ML   Culture:    ESCHERICHIA COLI   Report Status:    11/12/2012 FINAL   Organism ID, Bacteria:    ESCHERICHIA COLI      Culture & Susceptibility  ESCHERICHIA COLI        Antibiotic  Sensitivity  Microscan  Status     AMPICILLIN  Resistant  >=32  Final     Method:  MIC     CEFAZOLIN  Sensitive  8  Final     Method:  MIC     CEFTRIAXONE  Sensitive  <=1  Final     Method:  MIC     CIPROFLOXACIN  Resistant  >=4  Final     Method:  MIC     GENTAMICIN  Resistant  >=16  Final     Method:  MIC     LEVOFLOXACIN  Resistant  >=8  Final     Method:  MIC     NITROFURANTOIN  Sensitive  32  Final     Method:  MIC     PIP/TAZO  Sensitive  <=4  Final     Method:  MIC     TOBRAMYCIN  Intermediate  8  Final     Method:  MIC     TRIMETH/SULFA  Resistant  >=320  Final     Method:  MIC      Comments  ESCHERICHIA COLI (MIC)       ESCHERICHIA COLI     Studies/Results: US Renal  11/11/2012  *RADIOLOGY REPORT*  Clinical Data: CKD, ARF.  RENAL/URINARY TRACT ULTRASOUND COMPLETE  Comparison:  08/21/2012  Findings: Technically challenging examination due to patient body habitus, ascites, and limited positioning.  Right  Kidney:  Poorly visualized.  There may be mild pelvicaliectasis.  Left Kidney:  Not visualized  Bladder:  Not visualized  Moderate amount of ascites.  IMPRESSION: Suboptimal examination. There may be mild pelvicaliectasis on the right.  Left kidney not visualized.   Original Report Authenticated By: Carlos Levering, M.D.    Dg Chest Port 1 View  11/12/2012  *RADIOLOGY REPORT*  Clinical Data: Evaluate endotracheal tube position  PORTABLE CHEST - 1 VIEW  Comparison: Portable chest x-ray of 11/11/2012  Findings: The tip of the endotracheal tube is difficult to visualize, approximately 2.2 cm above the carina.  The lungs are not well aerated and cardiomegaly and mild basilar atelectasis remain.  IMPRESSION: Tip of endotracheal tube 2.2 cm above carina.  No change in poor aeration and basilar atelectasis.   Original Report Authenticated By: Ivar Drape, M.D.    Dg Chest Port 1 View  11/11/2012  *RADIOLOGY REPORT*  Clinical Data: Attempted right internal jugular line placement. Question pneumothorax.  PORTABLE CHEST - 1 VIEW  Comparison: One-view chest 11/10/2012.  Findings: The endotracheal tube terminates 4.5 cm above the carina, in satisfactory position.  The left IJ line is stable.  The NG tube courses off the inferior border of the film. The heart remains enlarged.  Edematous changes in the lungs are slightly improved. Lung volumes are slightly improved as well.  There is no pneumothorax.  IMPRESSION:  1.  Slight improved lung volumes and aeration with persistent cardiomegaly and interstitial edema suggesting some degree of congestive heart failure. 2.  Satisfactory positioning of support apparatus as above.   Original Report Authenticated By: San Morelle, M.D.    Dg Chest Portable 1 View  11/10/2012  *RADIOLOGY REPORT*  Clinical Data: Left central line placement.  Altered mental status. Cough and chest congestion.  PORTABLE CHEST - 1 VIEW 8:26 p.m.  Comparison: Chest x-ray dated 11/10/2012 at  7:52 p.m.  Findings: Endotracheal tube in good position.  Central venous catheter tip appears to be in  the superior vena cava at the level of the azygos vein.  Increasing pulmonary vascular congestion and pulmonary edema or pneumonia with consolidation in the left perihilar region.  IMPRESSION: Central line appears in good position.  No pneumothorax. Progressive bilateral infiltrates.   Original Report Authenticated By: Lorriane Shire, M.D.    Dg Chest Port 1 View  11/10/2012  *RADIOLOGY REPORT*  Clinical Data: Post intubation  PORTABLE CHEST - 1 VIEW  Comparison: 11/10/2012 at 1822 hours  Findings: Endotracheal tube terminates 3 cm above the carina.  Cardiomegaly with suspected mild interstitial edema.  Mild patchy retrocardiac/left lower lobe opacity, atelectasis versus pneumonia.  No pleural effusion or pneumothorax.  Enteric tube courses below the diaphragm.  IMPRESSION: Endotracheal tube terminates 3 cm above the carina.  Cardiomegaly with suspected mild interstitial edema.  Mild left lower lobe opacity, atelectasis versus pneumonia.   Original Report Authenticated By: Julian Hy, M.D.    Dg Chest Port 1 View  11/10/2012  *RADIOLOGY REPORT*  Clinical Data: Altered mental status, cough, congestion  PORTABLE CHEST - 1 VIEW  Comparison: 08/16/2012  Findings: Cardiomegaly with pulmonary vascular congestion.  No frank interstitial edema.  Patchy left lower lobe opacity, atelectasis versus pneumonia.  No pleural effusion or pneumothorax.  IMPRESSION: Patchy left lower lobe opacity, atelectasis versus pneumonia.  Cardiomegaly with pulmonary vascular congestion.  No frank interstitial edema.   Original Report Authenticated By: Julian Hy, M.D.    Medications:    . fentaNYL infusion INTRAVENOUS 200 mcg/hr (11/12/12 0800)  . norepinephrine (LEVOPHED) Adult infusion 4 mcg/min (11/12/12 0800)  . dialysis replacement fluid (prismasate) 300 mL/hr at 11/11/12 1830  . dialysis replacement fluid  (prismasate) 300 mL/hr at 11/11/12 1830  . dialysate (PRISMASATE) 2,000 mL/hr at 11/12/12 [id number]  . vasopressin (PITRESSIN) infusion - *FOR SHOCK* Stopped (11/11/12 1600)      . antiseptic oral rinse  15 mL Mouth Rinse QID  . chlorhexidine  15 mL Mouth Rinse BID  . dextrose  1 ampule Intravenous Once  . heparin  5,000 Units Subcutaneous Q8H  . hydrocortisone sod succinate (SOLU-CORTEF) injection  50 mg Intravenous Q6H  . imipenem-cilastatin  250 mg Intravenous Q6H  . insulin aspart  2-6 Units Subcutaneous Q4H  . levofloxacin (LEVAQUIN) IV  250 mg Intravenous Q24H  . midazolam  4 mg Intravenous Once  . pantoprazole (PROTONIX) IV  40 mg Intravenous QHS  . sodium chloride  25 mL/kg Intravenous Once    I  have reviewed scheduled and prn medications.  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  She   presented 11/10/12 with hypercarbic respiratory failure, sepsis syndrome, LLL PNA, GNR bacteremia and EColi in the urine, acute renal failure, severe hyperkalemia and severe metabolic acidosis.   1. AKI on CKD (creatinine 2.47 08/2012) Secondary to sepsis syndrome and hypoperfusion (gram negative bacteremia/shock)) Pressors at lower doses plus dobutamine  Cannot r/o obstruction esp with anuria and prior (04/3012) history of left obstructing stone requiring JJ stent  GNR in blood EColi in urine CRRT started 11/11/12 via right fem HD cath (12/16) Acidosis and hyperkalemia corrected.  Continue CRRT  Have changed to all 4K fluids and bicarb drip discontinued/no anticoagulation Start pulling fluid 50-75 cc/hour K normalzed Acidemia corrected  Bicarb drip d/c'd  2  Massive obesity   3  Sepsis Urine vs Hydradenitis on empiric antibiotics Needs imaging (CT) of abdomen to make sure no obstructing stones   4 DM per primary   5 Anemia  Iron low but no IV iron while septic Transfused yesterday   6 Sec HPTH  Level (pending)  PTH 200 (09/05/12)   7 Hydradenitis suppurativa   8 Hypothyroid replace IV   9 Bilateral renal stones need to make sure not recurrent and will need CT to do to r/o hydro  10 GNR sepsis EColi in blood Per CCM adj antibiotics  Jamal Maes, MD [facility] [phone] pager 11/12/2012, 8:42 AM

## 2012-11-12 NOTE — Progress Notes (Signed)
Name: [name] MRN: [id number] DOB: 21-Dec-1960    LOS: 2  PULMONARY / CRITICAL CARE MEDICINE  HPI:   51 years old morbid obese female with complex PMH relevant for systolic CHF with LVEF of 35 to 40%, HTN, DM2, hypothyroidism, chronic pain on narcotics and CKD being evaluated by nephrology for dialysis. She also has a complex history of suppurative hidradenitis and is on chronic antibiotic therapy. In icu for PNA, septic shock, ARF.   Abx: 12/15 vanc>>>12/15 12/15 zosyn>>>12/17 12/16 levofloxacin>>>12/17  Cultures: 12/15 BC x 2>>> 12/15 urine>>>E coli (variable res) 12/15 tracheal >>>  Lines: 12/15 left IJ >>> 12/15  Rt rad>>> 12/15 ETT>>> 12/16 rt fem HD >>>  Events/ Studeis: 12/15 >>> Arrest 12/15 renal US>>Suboptimal examination. There may be mild pelvicaliectasis on the<BR>right.<BR> <BR>Left kidney not visualized 12/16>>>ARF, shock, started on cvvhd 12/16 echo - 55% EF, mod reduced rv fxn, PA 46 12/17- pressors better, K resolved   Vital Signs: Temp:  [94.3 F (34.6 C)-98.5 F (36.9 C)] 94.3 F (34.6 C) (12/17 0334) Pulse Rate:  [63-80] 68  (12/17 0800) Resp:  [11-35] 30  (12/17 0800) BP: (79-131)/(20-91) 106/54 mmHg (12/17 0800) SpO2:  [97 %-100 %] 100 % (12/17 0800) Arterial Line BP: (88-141)/(46-77) 117/49 mmHg (12/17 0800) FiO2 (%):  [39.1 %-81.4 %] 40 % (12/17 0800) Weight:  [171.6 kg (378 lb 5 oz)] 171.6 kg (378 lb 5 oz) (12/17 0400)  Physical Examination: General:  Morbid obese, Intubated, mechanically ventilated Neuro:  Sedated, rass -1, does not follow commands, localzies HEENT:  PERRL 4 mm Neck:  Supple, no JVD   Cardiovascular:  RRR, no M/R/G Lungs:  Reduced bases Abdomen:  Soft, nontender, nondistended, bowel sounds present, obese Musculoskeletal:  Moves all extremities, no pedal edema Skin:  Severe actively suppurating hidradenitis to the perineal, majority chronic lichenified wounds  Principal Problem:  *Sepsis   ASSESSMENT AND  PLAN  PULMONARY  Lab 11/12/12 0505 11/11/12 1241 11/11/12 0351 11/11/12 0011 11/11/12 0010  PHART 7.405 7.287* 7.191* 6.889* 6.890*  PCO2ART 37.1 34.8* 35.8 53.1* 52.3*  PO2ART 115.0* 173.0* 115.0* 90.0 93.0  HCO3 22.8 16.6* 13.7* 10.1* 9.9*  O2SAT 99.1 99.0 97.0 87.0 88.0   Ventilator Settings: Vent Mode:  [-] PRVC FiO2 (%):  [39.1 %-81.4 %] 40 % Set Rate:  [30 bmp] 30 bmp Vt Set:  [500 mL-540 mL] 540 mL PEEP:  [5 cmH20-5.1 cmH20] 5.1 cmH20 Plateau Pressure:  [30 [id number] cmH20] 30 cmH20 CXR: Bilateral infiltrates interstial in nature, ett wnl, line wnl  A:   1) Acute hypoxemic and hypercarbic respiratory failure 2) Bilateral pneumonia, LLL 3) acidosis with associated hyeprkalemia P:   -ABg reviewed, PH corrected, bicarb to dc -rate to 24, volume to remain same, abg to follow -pcxr to evaluate in am, LLL -NO SBT today, elevated MV  CARDIOVASCULAR  Lab 11/11/12 0010 11/10/12 2055 11/10/12 1927 11/10/12 1724  TROPONINI -- -- -- <0.30  LATICACIDVEN 11.3* 2.4* 2.53* --  PROBNP -- -- -- [id number]    A:  1) Sepsis secondary to bilateral pneumonia / urosepsis 2) Systolic CHF 3) Metabolic acidosis (lactic) secondary to sepsis  P:  - much improved pressor needs -dc vaso -cortisol 42, no rel ai, dc roids -agree can consider removal volume, cvp up, trend better than absoluet value -echo reviewed  RENAL  Lab 11/12/12 0330 11/11/12 1544 11/11/12 0500 11/11/12 0011 11/11/12 0010 11/10/12 1724  NA 131* 132* 129* 133* 144 --  K 4.5 5.0 -- -- -- --  CL  94* 96 96 -- 99 98  CO2 22 17* 14* -- 23 16*  BUN 64* 76* 85* -- 84* 86*  CREATININE 6.20* 7.54* 8.89* -- 8.43* 8.98*  CALCIUM 7.4* 7.3* 7.7* -- 7.6* 7.6*  MG 2.3 -- 2.4 -- -- --  PHOS 4.0 5.0* 6.1* -- -- --   Intake/Output      12/16 0701 - 12/17 0700 12/17 0701 - 12/18 0700   I.V. (mL/kg) 4624.3 (26.9) 200 (1.2)   Blood     NG/GT 90 30   IV Piggyback 550    Total Intake(mL/kg) 5264.3 (30.7) 230 (1.3)   Urine  (mL/kg/hr) 10 (0) 5   Emesis/NG output 600 100   Other 4713 210   Total Output 5323 315   Net -58.7 -85        Stool Occurrence 2 x     Foley:  11/10/12  A:   1) Acute on chronic renal failure 2) Hyperkalemia resolved P:   -agree dc bicarb -chem in am  Fluids per renal Agree can consider even to slight neg balance  GASTROINTESTINAL  Lab 11/12/12 0330 11/11/12 1544 11/11/12 0500 11/11/12 0010  AST 40* -- 14 8  ALT 7 -- <5 <5  ALKPHOS 141* -- 164* 174*  BILITOT 0.6 -- 1.2 0.5  PROT 8.2 -- 8.3 7.7  ALBUMIN 2.1* 2.1* 2.1* 1.9*    A:   1)  obese P:   - GI prophylaxis with protonix -start Tf after CT -consider Oxepa, wit infiltrates  HEMATOLOGIC  Lab 11/12/12 0330 11/11/12 1355 11/11/12 0500 11/11/12 0120 11/11/12 0011 11/11/12 0010  HGB 7.3* 7.5* 7.8* 7.1* 9.5* --  HCT 22.5* 23.9* 25.6* 23.5* 28.0* --  PLT 204 276 290 303 -- 294  INR -- -- -- -- -- 1.78*  APTT 36 -- 39* -- -- --   A:   1) Anemia likely due to chronic disease and sepsis P:  -Cbc am -sub q heparin  INFECTIOUS  Lab 11/12/12 0330 11/11/12 1355 11/11/12 0500 11/11/12 0120 11/11/12 0010 11/10/12 2055  WBC 35.3* 36.6* 50.5* 37.1* 38.2* --  PROCALCITON -- -- -- -- -- 81.37    A:   1) Septic shock-gram neg - Bilateral pneumonia - Suppurative hidradenitis - UTI likely source gram neg P:   -E coli noted, narrow to ceftriaxone -renal US concerning stone history, poor visualization -for CT abdo/pelvis, chest, ensure no hydro  ENDOCRINE  Lab 11/12/12 0740 11/12/12 0331 11/12/12 0043 11/11/12 2011 11/11/12 1548  GLUCAP 139* 133* 138* 124* 147*   A:   1) DM 2  2 rel AI r/o P:   - ICU hyperglycemia protocol with SQ Novolog   NEUROLOGIC  A:   1) Altered mental status likely due to sepsis and hypercarbia 2) Intubated, sedated P:   - WUA today ok  -fent at [facility] / DISPOSITION - Level of Care:  ICU - Primary Service:  PCCM - Consultants:  Nephrology - Code Status:  Full  code - Diet:  Tf 12/17 - DVT Px:  Heparin sq - GI Px:  Protonix IV - Skin Integrity:  Severe suppurative hidradenitis  - Social / Family:  Family updated ad bedside  The patient is critically ill with multiple organ systems failure and requires high complexity decision making for assessment and support, frequent evaluation and titration of therapies, application of advanced monitoring technologies and extensive interpretation of multiple databases.   Critical Care Time devoted to patient care services described in this  note is: 40 min  Lavon Paganini. Titus Mould, MD, [place] Pgr: [facility]

## 2012-11-12 NOTE — Progress Notes (Signed)
Microbiology reviewed   E Coli UTI; sens to Rocephin.

## 2012-11-12 NOTE — Plan of Care (Signed)
Problem: Phase I Progression Outcomes Goal: Hemodynamically stable Outcome: Not Met (add Reason) On pressors Goal: Voiding-avoid urinary catheter unless indicated Outcome: Not Applicable Date Met:  [id number] Has foley

## 2012-11-12 NOTE — Progress Notes (Signed)
INITIAL NUTRITION ASSESSMENT  DOCUMENTATION CODES Per approved criteria  -Morbid Obesity   INTERVENTION:  Initiate TF via OG tube with Oxepa at 15 ml/h, increase by 10 ml every 4 hours to goal rate of 25 ml/h with Prostat 60 ml QID to provide 1700 kcals, 158 gm protein, 471 ml free water daily.  NUTRITION DIAGNOSIS: Inadequate oral intake related to inability to eat as evidenced by NPO status.   Goal: Enteral nutrition to provide 60-70% of estimated calorie needs (22-25 kcals/kg ideal body weight) and >/= 90% of estimated protein needs, based on ASPEN guidelines for permissive underfeeding in critically ill obese individuals.  Monitor:  TF tolerance/adequacy, weight trend, labs, I/O  Reason for Assessment: MD Consult for TF initiation and management.  51 y.o. female  Admitting Dx: Sepsis  ASSESSMENT: Patient was admitted with PNA, septic shock, and ARF.  Patient with AKI secondary to sepsis syndrome and hypoperfusion -- is receiving CRRT via femoral line.  OG tube in place.  Patient is currently intubated on ventilator support.  MV: 12.5 Temp:Temp (24hrs), Avg:96.1 F (35.6 C), Min:94.3 F (34.6 C), Max:97.7 F (36.5 C)   Height: Ht Readings from Last 1 Encounters:  11/10/12 5' 6.14" (1.68 m)    Weight: Wt Readings from Last 1 Encounters:  11/12/12 378 lb 5 oz (171.6 kg)    Ideal Body Weight: 59.1 kg  % Ideal Body Weight: 290%  Wt Readings from Last 10 Encounters:  11/12/12 378 lb 5 oz (171.6 kg)  08/22/12 366 lb 10 oz (166.3 kg)  06/03/12 357 lb 5.9 oz (162.1 kg)  06/03/12 357 lb 5.9 oz (162.1 kg)  05/23/12 350 lb (158.759 kg)  04/07/12 379 lb 13.6 oz (172.3 kg)  03/07/12 338 lb (153.316 kg)  01/30/12 357 lb 12.9 oz (162.3 kg)  08/03/11 340 lb (154.223 kg)  07/26/11 340 lb (154.223 kg)    Usual Body Weight: 350 lb  % Usual Body Weight: 108%  Weight up with fluid overload and AKI.  BMI:  Body mass index is 60.80 kg/(m^2).  Class 3, extreme  obesity  Estimated Nutritional Needs: Kcal: 2415 Protein: > 140 gm Fluid: 2.4-2.6 L  Skin: no problems noted  Diet Order: NPO  EDUCATION NEEDS: -Education not appropriate at this time   Intake/Output Summary (Last 24 hours) at 11/12/12 1312 Last data filed at 11/12/12 1300  Gross per 24 hour  Intake 4189.9 ml  Output   5615 ml  Net -1425.1 ml    Last BM: 12/17   Labs:   Lab 11/12/12 0330 11/11/12 1544 11/11/12 0500  NA 131* 132* 129*  K 4.5 5.0 6.8*  CL 94* 96 96  CO2 22 17* 14*  BUN 64* 76* 85*  CREATININE 6.20* 7.54* 8.89*  CALCIUM 7.4* 7.3* 7.7*  MG 2.3 -- 2.4  PHOS 4.0 5.0* 6.1*  GLUCOSE 152* 164* 153*    CBG (last 3)   Basename 11/12/12 1207 11/12/12 0740 11/12/12 0331  GLUCAP 125* 139* 133*    Scheduled Meds:   . antiseptic oral rinse  15 mL Mouth Rinse QID  . cefTRIAXone (ROCEPHIN)  IV  1 g Intravenous Q24H  . chlorhexidine  15 mL Mouth Rinse BID  . dextrose  1 ampule Intravenous Once  . heparin  5,000 Units Subcutaneous Q8H  . insulin aspart  2-6 Units Subcutaneous Q4H  . midazolam  4 mg Intravenous Once  . pantoprazole (PROTONIX) IV  40 mg Intravenous QHS  . sodium chloride  25 mL/kg Intravenous Once  Continuous Infusions:   . feeding supplement (OXEPA)    . fentaNYL infusion INTRAVENOUS 200 mcg/hr (11/12/12 0800)  . norepinephrine (LEVOPHED) Adult infusion 6 mcg/min (11/12/12 1000)  . dialysis replacement fluid (prismasate) 300 mL/hr at 11/11/12 1830  . dialysis replacement fluid (prismasate) 300 mL/hr at 11/11/12 1830  . dialysate (PRISMASATE) 2,000 mL/hr at 11/12/12 1251    Past Medical History  Diagnosis Date  . Systolic congestive heart failure   . SOB (shortness of breath)   . HTN (hypertension)   . DM type 2 (diabetes mellitus, type 2)   . Chronic kidney disease (CKD), stage III (moderate)   . Iron deficiency anemia   . Morbid obesity   . Hyperkalemia   . Hypothyroidism   . CHF (congestive heart failure)     Past  Surgical History  Procedure Date  . Portacath placement   . Cystoscopy w/ ureteral stent placement 05/28/2012    Procedure: CYSTOSCOPY WITH RETROGRADE PYELOGRAM/URETERAL STENT PLACEMENT;  Surgeon: Reece Packer, MD;  Location: [facility];  Service: Urology;  Laterality: Left;    Molli Barrows, RD, LDN, CNSC Pager# [phone] After Hours Pager# [phone]

## 2012-11-12 NOTE — Progress Notes (Signed)
Blood loss during dialysis; Hg 6.7   Transfuse 1 unit of PRBC. Repeat cbc post transfusion

## 2012-11-12 NOTE — Progress Notes (Signed)
Rate decreased to 24 per Dr. Titus Mould.

## 2012-11-13 ENCOUNTER — Inpatient Hospital Stay (HOSPITAL_COMMUNITY): Payer: BC Managed Care – PPO

## 2012-11-13 LAB — CULTURE, RESPIRATORY W GRAM STAIN

## 2012-11-13 LAB — CULTURE, BLOOD (ROUTINE X 2)

## 2012-11-13 LAB — BLOOD GAS, ARTERIAL
Acid-Base Excess: 1.2 mmol/L (ref 0.0–2.0)
Drawn by: 10006
FIO2: 0.4 %
MECHVT: 540 mL
RATE: 16 resp/min
pCO2 arterial: 41.6 mmHg (ref 35.0–45.0)
pH, Arterial: 7.404 (ref 7.350–7.450)
pO2, Arterial: 152 mmHg — ABNORMAL HIGH (ref 80.0–100.0)

## 2012-11-13 LAB — CBC WITH DIFFERENTIAL/PLATELET
Basophils Relative: 0 % (ref 0–1)
Eosinophils Absolute: 0.1 10*3/uL (ref 0.0–0.7)
Eosinophils Relative: 0 % (ref 0–5)
Lymphs Abs: 0.8 10*3/uL (ref 0.7–4.0)
MCH: 27.9 pg (ref 26.0–34.0)
MCHC: 33.2 g/dL (ref 30.0–36.0)
MCV: 84.1 fL (ref 78.0–100.0)
Monocytes Relative: 3 % (ref 3–12)
Neutrophils Relative %: 93 % — ABNORMAL HIGH (ref 43–77)
Platelets: 154 10*3/uL (ref 150–400)

## 2012-11-13 LAB — GLUCOSE, CAPILLARY
Glucose-Capillary: 133 mg/dL — ABNORMAL HIGH (ref 70–99)
Glucose-Capillary: 129 mg/dL — ABNORMAL HIGH (ref 70–99)
Glucose-Capillary: 137 mg/dL — ABNORMAL HIGH (ref 70–99)

## 2012-11-13 LAB — COMPREHENSIVE METABOLIC PANEL
ALT: 8 U/L (ref 0–35)
AST: 40 U/L — ABNORMAL HIGH (ref 0–37)
Albumin: 2 g/dL — ABNORMAL LOW (ref 3.5–5.2)
Alkaline Phosphatase: 160 U/L — ABNORMAL HIGH (ref 39–117)
CO2: 25 mEq/L (ref 19–32)
Chloride: 99 mEq/L (ref 96–112)
GFR calc non Af Amer: 11 mL/min — ABNORMAL LOW (ref >90)
Potassium: 4.3 mEq/L (ref 3.5–5.1)
Sodium: 136 mEq/L (ref 135–145)
Total Bilirubin: 0.6 mg/dL (ref 0.3–1.2)

## 2012-11-13 LAB — APTT: aPTT: 35 seconds (ref 24–37)

## 2012-11-13 LAB — MAGNESIUM: Magnesium: 2.3 mg/dL (ref 1.5–2.5)

## 2012-11-13 LAB — PHOSPHORUS: Phosphorus: 3.3 mg/dL (ref 2.3–4.6)

## 2012-11-13 MED ORDER — PARICALCITOL 5 MCG/ML IV SOLN
2.0000 ug | INTRAVENOUS | Status: DC
  Administered 2012-11-13 – 2012-11-18 (×3): 2 ug via INTRAVENOUS
  Filled 2012-11-13 (×8): qty 0.4

## 2012-11-13 MED ORDER — DEXTROSE 5 % IV SOLN
1.0000 g | Freq: Once | INTRAVENOUS | Status: AC
  Administered 2012-11-13: 1 g via INTRAVENOUS
  Filled 2012-11-13: qty 10

## 2012-11-13 MED ORDER — DEXTROSE 5 % IV SOLN
2.0000 g | INTRAVENOUS | Status: DC
  Administered 2012-11-14 – 2012-11-24 (×11): 2 g via INTRAVENOUS
  Filled 2012-11-13 (×12): qty 2

## 2012-11-13 NOTE — Progress Notes (Signed)
Name: [name] MRN: [id number] DOB: May 22, 1961    LOS: 3  PULMONARY / CRITICAL CARE MEDICINE  HPI:   51 years old morbid obese female with complex PMH relevant for systolic CHF with LVEF of 35 to 40%, HTN, DM2, hypothyroidism, chronic pain on narcotics and CKD being evaluated by nephrology for dialysis. She also has a complex history of suppurative hidradenitis and is on chronic antibiotic therapy. In icu for PNA, septic shock, ARF.   Abx: 12/15 vanc>>>12/15 12/15 zosyn>>>12/17 12/16 levofloxacin>>>12/17 12/16 ceftriaxone>>>  Cultures: 12/15 BC x 2>>>E coli sens ceftr 12/15 urine>>>E coli (variable res) 12/15 tracheal >>>  Lines: 12/15 left IJ >>> 12/15  Rt rad>>> 12/15 ETT>>> 12/16 rt fem HD >>>  Events/ Studeis: 12/15 >>> Arrest 12/15 renal US>>Suboptimal examination. There may be mild pelvicaliectasis on the<BR>right.<BR> <BR>Left kidney not visualized 12/16>>>ARF, shock, started on cvvhd 12/16 echo - 55% EF, mod reduced rv fxn, PA 46 12/17- pressors better, K resolved  12/17 CT chest- bibasilr small infiltrates / atx 12/17 CT abdo/pelvis>>>Left ureteral stent placement following removal of left  ureteral calculi. No residual left-sided hydronephrosis or ureteral stone fragments. Apparent lithotripsy of the right renal calculi with obstructing 10 mm stone fragment at the right ureteral pelvic junction. There are small renal calyceal stone fragments bilaterally. Anasarca 12/18- off pressors, weaning, awake  Vital Signs: Temp:  [97.6 F (36.4 C)-98.7 F (37.1 C)] 98.1 F (36.7 C) (12/18 0857) Pulse Rate:  [61-76] 73  (12/18 0900) Resp:  [0-25] 18  (12/18 0800) BP: (80-116)/(39-67) 86/50 mmHg (12/18 0900) SpO2:  [100 %] 100 % (12/18 0900) Arterial Line BP: (88-134)/(43-69) 94/52 mmHg (12/18 0900) FiO2 (%):  [39.5 %-40.6 %] 40 % (12/18 0900) Weight:  [167.9 kg (370 lb 2.4 oz)] 167.9 kg (370 lb 2.4 oz) (12/18 0500)  Physical Examination: General:  Morbid obese,  Intubated, mechanically ventilated Neuro:  rass 0, not cooperative HEENT:  PERRL 3 mm Neck:  Supple, no JVD   Cardiovascular:  RRR, no M/R/G Lungs:  diminshed Abdomen:  Soft, nontender, nondistended, bowel sounds present, obese massive Musculoskeletal:  Moves all extremities, no pedal edema Skin:  Severe actively suppurating hidradenitis / majority chronic lichenified wounds  Principal Problem:  *Sepsis   ASSESSMENT AND PLAN  PULMONARY  Lab 11/13/12 0443 11/12/12 1037 11/12/12 0505 11/11/12 1241 11/11/12 0351  PHART 7.404 7.456* 7.405 7.287* 7.191*  PCO2ART 41.6 34.9* 37.1 34.8* 35.8  PO2ART 152.0* 130.0* 115.0* 173.0* 115.0*  HCO3 25.4* 24.8* 22.8 16.6* 13.7*  O2SAT 99.6 99.0 99.1 99.0 97.0   Ventilator Settings: Vent Mode:  [-] CPAP;PSV FiO2 (%):  [39.5 %-40.6 %] 40 % Set Rate:  [16 bmp] 16 bmp Vt Set:  [540 mL] 540 mL PEEP:  [4.9 cmH20-5 cmH20] 5 cmH20 Plateau Pressure:  [0 [id number] cmH20] 26 cmH20 CXR: Bilateral infiltrates interstial unchanged, big heart  A:   1) Acute hypoxemic and hypercarbic respiratory failure 2) Bilateral pneumonia basilar vs atx P:   -lower vent needs -wean sbt ps escalation needed, cpap 5 -upright when able -abdominal wall will increase falsely PLat etc -CT chest reviewed, unimpressive  CARDIOVASCULAR  Lab 11/11/12 0010 11/10/12 2055 11/10/12 1927 11/10/12 1724  TROPONINI -- -- -- <0.30  LATICACIDVEN 11.3* 2.4* 2.53* --  PROBNP -- -- -- [id number]   A:  1) Sepsis secondary to bilateral pneumonia / urosepsis 2) Systolic CHF 3) Metabolic acidosis (lactic) secondary to sepsis  P:  -no rel AI -off pressors -MAP goals met -continue neg balance  RENAL  Lab 11/13/12 0500 11/12/12 1810 11/12/12 0330 11/11/12 1544 11/11/12 0500  NA 136 135 131* 132* 129*  K 4.3 4.5 -- -- --  CL 99 98 94* 96 96  CO2 25 24 22  17* 14*  BUN 47* 54* 64* 76* 85*  CREATININE 4.37* 5.17* 6.20* 7.54* 8.89*  CALCIUM 7.4* 7.0* 7.4* 7.3* 7.7*  MG 2.3 --  2.3 -- 2.4  PHOS 3.3 3.5 4.0 5.0* 6.1*   Intake/Output      12/17 0701 - 12/18 0700 12/18 0701 - 12/19 0700   I.V. (mL/kg) 1254.9 (7.5) 40 (0.2)   Blood 300    NG/GT 505 50   IV Piggyback 50    Total Intake(mL/kg) 2109.9 (12.6) 90 (0.5)   Urine (mL/kg/hr) 5 (0)    Emesis/NG output 100    Other 3135 344   Total Output 3240 344   Net -1130.1 -254         Foley:  11/10/12  A:   1) Acute on chronic renal failure 2) Hyperkalemia resolved P:   -foley needed -no urine cvvhd Goal neg further  GASTROINTESTINAL  Lab 11/13/12 0500 11/12/12 1810 11/12/12 0330 11/11/12 1544 11/11/12 0500 11/11/12 0010  AST 40* -- 40* -- 14 8  ALT 8 -- 7 -- <5 <5  ALKPHOS 160* -- 141* -- 164* 174*  BILITOT 0.6 -- 0.6 -- 1.2 0.5  PROT 7.5 -- 8.2 -- 8.3 7.7  ALBUMIN 2.0* 1.9* 2.1* 2.1* 2.1* --    A:   1)  obese P:   - GI prophylaxis with protonix -CT reviewed, see ID Oxepa, to goal Monitor for further BM  HEMATOLOGIC  Lab 11/13/12 0500 11/12/12 1714 11/12/12 0930 11/12/12 0330 11/11/12 1355 11/11/12 0500 11/11/12 0010  HGB 7.0* 6.7* -- 7.3* 7.5* 7.8* --  HCT 21.1* 20.8* -- 22.5* 23.9* 25.6* --  PLT 154 174 -- 204 276 290 --  INR -- -- 1.69* -- -- -- 1.78*  APTT 35 -- 36 36 -- 39* --   A:   1) Anemia likely due to chronic disease and sepsis P:  -Cbc am -sub q heparin -no bleeding now Did receive 1 unit Avoid further if able  INFECTIOUS  Lab 11/13/12 0500 11/12/12 1714 11/12/12 0330 11/11/12 1355 11/11/12 0500 11/10/12 2055  WBC 27.3* 28.4* 35.3* 36.6* 50.5* --  PROCALCITON -- -- -- -- -- 81.37    A:   1) Septic shock-E coli, urine source in setting of stent - Bilateral pneumonia bases mild - Suppurative hidradenitis P:   -continue ceftriaxone, goal 14 days treatment -CT noted, will call urology to follow, in case stent needs change if clinically declines  ENDOCRINE  Lab 11/13/12 0827 11/13/12 0411 11/13/12 0011 11/12/12 1953 11/12/12 1620  GLUCAP 126* 133* 142* 133*  123*   A:   1) DM 2 controlled 2 rel AI r/o P:   - ICU hyperglycemia protocol with SQ Novolog   NEUROLOGIC  A:   1) Altered mental status likely due to sepsis and hypercarbia 2) Intubated, sedated P:   - WUA daily -fent at 200, reduced -uprigth as able  BEST PRACTICE / DISPOSITION - Level of Care:  ICU - Primary Service:  PCCM - Consultants:  Nephrology - Code Status:  Full code - Diet:  Tf 12/17>>> - DVT Px:  Heparin sq - GI Px:  Protonix IV - Skin Integrity:  Severe suppurative hidradenitis  - Social / Family: none at bedside  The patient is critically ill with multiple  organ systems failure and requires high complexity decision making for assessment and support, frequent evaluation and titration of therapies, application of advanced monitoring technologies and extensive interpretation of multiple databases.   Critical Care Time devoted to patient care services described in this note is: 30 min  Lavon Paganini. Titus Mould, MD, [place] Pgr: [facility]

## 2012-11-13 NOTE — Consult Note (Signed)
Reason for Consult: Urosepsis, Nephrolithiasis Referring Physician: [name] MD  [name] is an 51 y.o. female.  HPI:   1 - Urosepsis + Nephrolithiasis - Pt with long h/o stones. She had episisode of stones 05/2012 + renal failure and CHF treated with left ureteral stent with plan for therapy once medically stable. Unfortunately she never achieved this goal and now represents with urosepsis with e. Coli bacteremia + UTI and multi-organ failure.  Pr presently intubated, on ABX + dialysis, she is weaning pressors and making progress per critical care team.  New CT this admission demonstrates left stent in good position / no hydro, but also Rt sided UPJ stone with some hydro. Dr. Matilde Sprang is her primary urologist.   Past Medical History  Diagnosis Date  . Systolic congestive heart failure   . SOB (shortness of breath)   . HTN (hypertension)   . DM type 2 (diabetes mellitus, type 2)   . Chronic kidney disease (CKD), stage III (moderate)   . Iron deficiency anemia   . Morbid obesity   . Hyperkalemia   . Hypothyroidism   . CHF (congestive heart failure)     Past Surgical History  Procedure Date  . Portacath placement   . Cystoscopy w/ ureteral stent placement 05/28/2012    Procedure: CYSTOSCOPY WITH RETROGRADE PYELOGRAM/URETERAL STENT PLACEMENT;  Surgeon: Reece Packer, MD;  Location: [facility];  Service: Urology;  Laterality: Left;    Family History  Problem Relation Age of Onset  . Coronary artery disease Mother   . Hypertension Mother   . Diabetes type II Mother   . Malignant hyperthermia Mother   . Coronary artery disease Father   . Hypertension Father   . Malignant hyperthermia Father   . Cancer Maternal Grandfather     ? Type  . Kidney failure Maternal Grandmother     Social History:  reports that she has never smoked. She has never used smokeless tobacco. She reports that she does not drink alcohol or use illicit drugs.  Allergies:  Allergies  Allergen  Reactions  . Amoxicillin-Pot Clavulanate Diarrhea  . Rosiglitazone Maleate     REACTION: swelling  . Amoxicillin Rash    Medications: I have reviewed the patient's current medications.  Results for orders placed during the hospital encounter of 11/10/12 (from the past 48 hour(s))  GLUCOSE, CAPILLARY     Status: Abnormal   Collection Time   11/11/12  8:11 PM      Component Value Range Comment   Glucose-Capillary 124 (*) 70 - 99 mg/dL   GLUCOSE, CAPILLARY     Status: Abnormal   Collection Time   11/12/12 12:43 AM      Component Value Range Comment   Glucose-Capillary 138 (*) 70 - 99 mg/dL    Comment 1 Documented in Chart      Comment 2 Notify RN     APTT     Status: Normal   Collection Time   11/12/12  3:30 AM      Component Value Range Comment   aPTT 36  24 - 37 seconds   COMPREHENSIVE METABOLIC PANEL     Status: Abnormal   Collection Time   11/12/12  3:30 AM      Component Value Range Comment   Sodium 131 (*) 135 - 145 mEq/L    Potassium 4.5  3.5 - 5.1 mEq/L    Chloride 94 (*) 96 - 112 mEq/L    CO2 22  19 - 32 mEq/L  Glucose, Bld 152 (*) 70 - 99 mg/dL    BUN 64 (*) 6 - 23 mg/dL    Creatinine, Ser 6.20 (*) 0.50 - 1.10 mg/dL    Calcium 7.4 (*) 8.4 - 10.5 mg/dL    Total Protein 8.2  6.0 - 8.3 g/dL    Albumin 2.1 (*) 3.5 - 5.2 g/dL    AST 40 (*) 0 - 37 U/L    ALT 7  0 - 35 U/L    Alkaline Phosphatase 141 (*) 39 - 117 U/L    Total Bilirubin 0.6  0.3 - 1.2 mg/dL    GFR calc non Af Amer 7 (*) >90 mL/min    GFR calc Af Amer 8 (*) >90 mL/min   CBC WITH DIFFERENTIAL     Status: Abnormal   Collection Time   11/12/12  3:30 AM      Component Value Range Comment   WBC 35.3 (*) 4.0 - 10.5 K/uL    RBC 2.65 (*) 3.87 - 5.11 MIL/uL    Hemoglobin 7.3 (*) 12.0 - 15.0 g/dL    HCT 22.5 (*) 36.0 - 46.0 %    MCV 84.9  78.0 - 100.0 fL    MCH 27.5  26.0 - 34.0 pg    MCHC 32.4  30.0 - 36.0 g/dL    RDW 16.7 (*) 11.5 - 15.5 %    Platelets 204  150 - 400 K/uL DELTA CHECK NOTED    Neutrophils Relative 95 (*) 43 - 77 %    Neutro Abs 33.5 (*) 1.7 - 7.7 K/uL    Lymphocytes Relative 1 (*) 12 - 46 %    Lymphs Abs 0.4 (*) 0.7 - 4.0 K/uL    Monocytes Relative 4  3 - 12 %    Monocytes Absolute 1.4 (*) 0.1 - 1.0 K/uL    Eosinophils Relative 0  0 - 5 %    Eosinophils Absolute 0.0  0.0 - 0.7 K/uL    Basophils Relative 0  0 - 1 %    Basophils Absolute 0.0  0.0 - 0.1 K/uL   MAGNESIUM     Status: Normal   Collection Time   11/12/12  3:30 AM      Component Value Range Comment   Magnesium 2.3  1.5 - 2.5 mg/dL   PHOSPHORUS     Status: Normal   Collection Time   11/12/12  3:30 AM      Component Value Range Comment   Phosphorus 4.0  2.3 - 4.6 mg/dL   GLUCOSE, CAPILLARY     Status: Abnormal   Collection Time   11/12/12  3:31 AM      Component Value Range Comment   Glucose-Capillary 133 (*) 70 - 99 mg/dL    Comment 1 Documented in Chart      Comment 2 Notify RN     BLOOD GAS, ARTERIAL     Status: Abnormal   Collection Time   11/12/12  5:05 AM      Component Value Range Comment   FIO2 0.40      Delivery systems VENTILATOR      Mode PRESSURE REGULATED VOLUME CONTROL      VT 540      Rate 30      Peep/cpap 5.0      pH, Arterial 7.405  7.350 - 7.450    pCO2 arterial 37.1  35.0 - 45.0 mmHg    pO2, Arterial 115.0 (*) 80.0 - 100.0 mmHg    Bicarbonate 22.8  20.0 - 24.0 mEq/L    TCO2 23.9  0 - 100 mmol/L    Acid-base deficit 1.3  0.0 - 2.0 mmol/L    O2 Saturation 99.1      Patient temperature 98.6      Collection site A-LINE      Drawn by [id number]      Sample type ARTERIAL      Allens test (pass/fail) NOT INDICATED (*) PASS   GLUCOSE, CAPILLARY     Status: Abnormal   Collection Time   11/12/12  7:40 AM      Component Value Range Comment   Glucose-Capillary 139 (*) 70 - 99 mg/dL   APTT     Status: Normal   Collection Time   11/12/12  9:30 AM      Component Value Range Comment   aPTT 36  24 - 37 seconds   PROTIME-INR     Status: Abnormal   Collection Time   11/12/12   9:30 AM      Component Value Range Comment   Prothrombin Time 19.3 (*) 11.6 - 15.2 seconds    INR 1.69 (*) 0.00 - 1.49   POCT I-STAT 3, BLOOD GAS (G3+)     Status: Abnormal   Collection Time   11/12/12 10:37 AM      Component Value Range Comment   pH, Arterial 7.456 (*) 7.350 - 7.450    pCO2 arterial 34.9 (*) 35.0 - 45.0 mmHg    pO2, Arterial 130.0 (*) 80.0 - 100.0 mmHg    Bicarbonate 24.8 (*) 20.0 - 24.0 mEq/L    TCO2 26  0 - 100 mmol/L    O2 Saturation 99.0      Acid-Base Excess 1.0  0.0 - 2.0 mmol/L    Patient temperature 97.2 F      Collection site ARTERIAL LINE      Drawn by Operator      Sample type ARTERIAL     GLUCOSE, CAPILLARY     Status: Abnormal   Collection Time   11/12/12 12:07 PM      Component Value Range Comment   Glucose-Capillary 125 (*) 70 - 99 mg/dL   GLUCOSE, CAPILLARY     Status: Abnormal   Collection Time   11/12/12  4:20 PM      Component Value Range Comment   Glucose-Capillary 123 (*) 70 - 99 mg/dL   CBC     Status: Abnormal   Collection Time   11/12/12  5:14 PM      Component Value Range Comment   WBC 28.4 (*) 4.0 - 10.5 K/uL    RBC 2.42 (*) 3.87 - 5.11 MIL/uL    Hemoglobin 6.7 (*) 12.0 - 15.0 g/dL    HCT 20.8 (*) 36.0 - 46.0 %    MCV 86.0  78.0 - 100.0 fL    MCH 27.7  26.0 - 34.0 pg    MCHC 32.2  30.0 - 36.0 g/dL    RDW 16.8 (*) 11.5 - 15.5 %    Platelets 174  150 - 400 K/uL   PREPARE RBC (CROSSMATCH)     Status: Normal   Collection Time   11/12/12  6:05 PM      Component Value Range Comment   Order Confirmation ORDER PROCESSED BY BLOOD BANK     RENAL FUNCTION PANEL     Status: Abnormal   Collection Time   11/12/12  6:10 PM      Component Value Range Comment   Sodium  135  135 - 145 mEq/L    Potassium 4.5  3.5 - 5.1 mEq/L    Chloride 98  96 - 112 mEq/L    CO2 24  19 - 32 mEq/L    Glucose, Bld 133 (*) 70 - 99 mg/dL    BUN 54 (*) 6 - 23 mg/dL    Creatinine, Ser 5.17 (*) 0.50 - 1.10 mg/dL    Calcium 7.0 (*) 8.4 - 10.5 mg/dL     Phosphorus 3.5  2.3 - 4.6 mg/dL    Albumin 1.9 (*) 3.5 - 5.2 g/dL    GFR calc non Af Amer 9 (*) >90 mL/min    GFR calc Af Amer 10 (*) >90 mL/min   GLUCOSE, CAPILLARY     Status: Abnormal   Collection Time   11/12/12  7:53 PM      Component Value Range Comment   Glucose-Capillary 133 (*) 70 - 99 mg/dL   GLUCOSE, CAPILLARY     Status: Abnormal   Collection Time   11/13/12 12:11 AM      Component Value Range Comment   Glucose-Capillary 142 (*) 70 - 99 mg/dL    Comment 1 Notify RN     GLUCOSE, CAPILLARY     Status: Abnormal   Collection Time   11/13/12  4:11 AM      Component Value Range Comment   Glucose-Capillary 133 (*) 70 - 99 mg/dL   BLOOD GAS, ARTERIAL     Status: Abnormal   Collection Time   11/13/12  4:43 AM      Component Value Range Comment   FIO2 0.40      Delivery systems VENTILATOR      Mode PRESSURE REGULATED VOLUME CONTROL      VT 540      Rate 16      Peep/cpap 5.0      pH, Arterial 7.404  7.350 - 7.450    pCO2 arterial 41.6  35.0 - 45.0 mmHg    pO2, Arterial 152.0 (*) 80.0 - 100.0 mmHg    Bicarbonate 25.4 (*) 20.0 - 24.0 mEq/L    TCO2 26.7  0 - 100 mmol/L    Acid-Base Excess 1.2  0.0 - 2.0 mmol/L    O2 Saturation 99.6      Patient temperature 98.6      Collection site A-LINE      Drawn by [id number]      Sample type ARTERIAL      Allens test (pass/fail) NOT INDICATED (*) PASS   APTT     Status: Normal   Collection Time   11/13/12  5:00 AM      Component Value Range Comment   aPTT 35  24 - 37 seconds   MAGNESIUM     Status: Normal   Collection Time   11/13/12  5:00 AM      Component Value Range Comment   Magnesium 2.3  1.5 - 2.5 mg/dL   COMPREHENSIVE METABOLIC PANEL     Status: Abnormal   Collection Time   11/13/12  5:00 AM      Component Value Range Comment   Sodium 136  135 - 145 mEq/L    Potassium 4.3  3.5 - 5.1 mEq/L    Chloride 99  96 - 112 mEq/L    CO2 25  19 - 32 mEq/L    Glucose, Bld 153 (*) 70 - 99 mg/dL    BUN 47 (*) 6 - 23 mg/dL  Creatinine, Ser 4.37 (*) 0.50 - 1.10 mg/dL    Calcium 7.4 (*) 8.4 - 10.5 mg/dL    Total Protein 7.5  6.0 - 8.3 g/dL    Albumin 2.0 (*) 3.5 - 5.2 g/dL    AST 40 (*) 0 - 37 U/L    ALT 8  0 - 35 U/L    Alkaline Phosphatase 160 (*) 39 - 117 U/L    Total Bilirubin 0.6  0.3 - 1.2 mg/dL    GFR calc non Af Amer 11 (*) >90 mL/min    GFR calc Af Amer 13 (*) >90 mL/min   CBC WITH DIFFERENTIAL     Status: Abnormal   Collection Time   11/13/12  5:00 AM      Component Value Range Comment   WBC 27.3 (*) 4.0 - 10.5 K/uL    RBC 2.51 (*) 3.87 - 5.11 MIL/uL    Hemoglobin 7.0 (*) 12.0 - 15.0 g/dL    HCT 21.1 (*) 36.0 - 46.0 %    MCV 84.1  78.0 - 100.0 fL    MCH 27.9  26.0 - 34.0 pg    MCHC 33.2  30.0 - 36.0 g/dL    RDW 17.6 (*) 11.5 - 15.5 %    Platelets 154  150 - 400 K/uL    Neutrophils Relative 93 (*) 43 - 77 %    Neutro Abs 25.5 (*) 1.7 - 7.7 K/uL    Lymphocytes Relative 3 (*) 12 - 46 %    Lymphs Abs 0.8  0.7 - 4.0 K/uL    Monocytes Relative 3  3 - 12 %    Monocytes Absolute 0.9  0.1 - 1.0 K/uL    Eosinophils Relative 0  0 - 5 %    Eosinophils Absolute 0.1  0.0 - 0.7 K/uL    Basophils Relative 0  0 - 1 %    Basophils Absolute 0.0  0.0 - 0.1 K/uL    WBC Morphology MILD LEFT SHIFT (1-5% METAS, OCC MYELO, OCC BANDS)     PHOSPHORUS     Status: Normal   Collection Time   11/13/12  5:00 AM      Component Value Range Comment   Phosphorus 3.3  2.3 - 4.6 mg/dL   GLUCOSE, CAPILLARY     Status: Abnormal   Collection Time   11/13/12  8:27 AM      Component Value Range Comment   Glucose-Capillary 126 (*) 70 - 99 mg/dL   GLUCOSE, CAPILLARY     Status: Abnormal   Collection Time   11/13/12 11:56 AM      Component Value Range Comment   Glucose-Capillary 153 (*) 70 - 99 mg/dL   GLUCOSE, CAPILLARY     Status: Abnormal   Collection Time   11/13/12  4:28 PM      Component Value Range Comment   Glucose-Capillary 129 (*) 70 - 99 mg/dL     Ct Abdomen Pelvis Wo Contrast  11/12/2012  *RADIOLOGY  REPORT*  Clinical Data:  Respiratory distress with lethargy and renal insufficiency.  Evaluate for hydronephrosis, calculi and infiltrates.  CT CHEST, ABDOMEN AND PELVIS WITHOUT CONTRAST  Technique:  Multidetector CT imaging of the chest, abdomen and pelvis was performed following the standard protocol without IV contrast.  Comparison:  Portable chest 11/12/2012, renal ultrasound 11/11/2012 and abdominal pelvic CT 05/25/2012.  CT CHEST  Findings:  Study remains limited by body habitus.  Endotracheal tube tip is in the mid trachea.  A nasogastric tube  is in place. Left-sided central line extends into the upper SVC.  Within the limitations of noncontrast technique, no significantly enlarged mediastinal or hilar lymph nodes are demonstrated. There are mildly prominent axillary lymph nodes, measuring up to 1.6 cm short axis on the left.  The heart is mildly enlarged.  There are trace bilateral pleural effusions.  There is diffuse soft tissue edema throughout the chest wall.  There are streaky air space opacities dependently in both lower lobes.  There is also linear opacity in the left upper lobe. No air bronchograms are identified and there is no endobronchial lesion.  IMPRESSION:  1.  Streaky bibasilar and left upper lobe pulmonary opacities may reflect atelectasis or pneumonia. 2.  Small bilateral pleural effusions. 3.  Anasarca with diffuse soft tissue edema. 4.  Mildly prominent axillary lymph nodes bilaterally.  CT ABDOMEN AND PELVIS  Findings:  A double-J left ureteral stent has been placed in the interval.  There are no definite stone fragments along the stent within the left ureter.  There is no residual left-sided hydronephrosis.  A tiny calyceal calculus is noted in the lower pole of the left kidney on image 84.  The right upper pole renal calyceal calculi have been fragmented in the interval.  There is a 10 mm calculus fragment obstructing the right ureteral pelvic junction on image 80.  There is a 6 mm  stone fragment in the lower pole calyces on image 84.  No definite right- sided ureteral calculi are seen. The right renal pelvis and calices are moderately dilated.  The liver, spleen, gallbladder, pancreas and adrenal glands appear unchanged without definite abnormalities.  No bowel lesions are identified.  Ascites and diffuse soft tissue edema have mildly worsened.  The bladder is decompressed by a Foley catheter.  There is a right femoral line extending into the pelvis.  There are several mildly enlarged retroperitoneal lymph nodes, measuring 11 mm short axis in the left periaortic region (image 90), 12 mm adjacent to the right common iliac vein on image 98, and 13 mm along the left pelvic sidewall (image 118).  These have not substantially changed.  IMPRESSION:  1.  Left ureteral stent placement following removal of left ureteral calculi.  No residual left-sided hydronephrosis or ureteral stone fragments. 2.  Apparent lithotripsy of the right renal calculi with obstructing 10 mm stone fragment at the right ureteral pelvic junction. There are small renal calyceal stone fragments bilaterally. 3.  Increased anasarca with ascites and diffuse soft tissue edema. 4.  Stable nonspecific retroperitoneal and pelvic lymphadenopathy.   Original Report Authenticated By: Richardean Sale, M.D.    Ct Chest Wo Contrast  11/12/2012  *RADIOLOGY REPORT*  Clinical Data:  Respiratory distress with lethargy and renal insufficiency.  Evaluate for hydronephrosis, calculi and infiltrates.  CT CHEST, ABDOMEN AND PELVIS WITHOUT CONTRAST  Technique:  Multidetector CT imaging of the chest, abdomen and pelvis was performed following the standard protocol without IV contrast.  Comparison:  Portable chest 11/12/2012, renal ultrasound 11/11/2012 and abdominal pelvic CT 05/25/2012.  CT CHEST  Findings:  Study remains limited by body habitus.  Endotracheal tube tip is in the mid trachea.  A nasogastric tube is in place. Left-sided central line  extends into the upper SVC.  Within the limitations of noncontrast technique, no significantly enlarged mediastinal or hilar lymph nodes are demonstrated. There are mildly prominent axillary lymph nodes, measuring up to 1.6 cm short axis on the left.  The heart is mildly enlarged.  There are  trace bilateral pleural effusions.  There is diffuse soft tissue edema throughout the chest wall.  There are streaky air space opacities dependently in both lower lobes.  There is also linear opacity in the left upper lobe. No air bronchograms are identified and there is no endobronchial lesion.  IMPRESSION:  1.  Streaky bibasilar and left upper lobe pulmonary opacities may reflect atelectasis or pneumonia. 2.  Small bilateral pleural effusions. 3.  Anasarca with diffuse soft tissue edema. 4.  Mildly prominent axillary lymph nodes bilaterally.  CT ABDOMEN AND PELVIS  Findings:  A double-J left ureteral stent has been placed in the interval.  There are no definite stone fragments along the stent within the left ureter.  There is no residual left-sided hydronephrosis.  A tiny calyceal calculus is noted in the lower pole of the left kidney on image 84.  The right upper pole renal calyceal calculi have been fragmented in the interval.  There is a 10 mm calculus fragment obstructing the right ureteral pelvic junction on image 80.  There is a 6 mm stone fragment in the lower pole calyces on image 84.  No definite right- sided ureteral calculi are seen. The right renal pelvis and calices are moderately dilated.  The liver, spleen, gallbladder, pancreas and adrenal glands appear unchanged without definite abnormalities.  No bowel lesions are identified.  Ascites and diffuse soft tissue edema have mildly worsened.  The bladder is decompressed by a Foley catheter.  There is a right femoral line extending into the pelvis.  There are several mildly enlarged retroperitoneal lymph nodes, measuring 11 mm short axis in the left periaortic  region (image 90), 12 mm adjacent to the right common iliac vein on image 98, and 13 mm along the left pelvic sidewall (image 118).  These have not substantially changed.  IMPRESSION:  1.  Left ureteral stent placement following removal of left ureteral calculi.  No residual left-sided hydronephrosis or ureteral stone fragments. 2.  Apparent lithotripsy of the right renal calculi with obstructing 10 mm stone fragment at the right ureteral pelvic junction. There are small renal calyceal stone fragments bilaterally. 3.  Increased anasarca with ascites and diffuse soft tissue edema. 4.  Stable nonspecific retroperitoneal and pelvic lymphadenopathy.   Original Report Authenticated By: Richardean Sale, M.D.    Dg Chest Port 1 View  11/13/2012  *RADIOLOGY REPORT*  Clinical Data: Check ET tube position.  PORTABLE CHEST - 1 VIEW  Comparison: 11/12/2012  Findings: Low lung volumes.  Support devices remain in place, unchanged.  Cardiomegaly with vascular congestion and bibasilar atelectasis.  No significant change.  IMPRESSION: No interval change.   Original Report Authenticated By: Rolm Baptise, M.D.    Dg Chest Port 1 View  11/12/2012  *RADIOLOGY REPORT*  Clinical Data: Evaluate endotracheal tube position  PORTABLE CHEST - 1 VIEW  Comparison: Portable chest x-ray of 11/11/2012  Findings: The tip of the endotracheal tube is difficult to visualize, approximately 2.2 cm above the carina.  The lungs are not well aerated and cardiomegaly and mild basilar atelectasis remain.  IMPRESSION: Tip of endotracheal tube 2.2 cm above carina.  No change in poor aeration and basilar atelectasis.   Original Report Authenticated By: Ivar Drape, M.D.     Review of Systems  Unable to perform ROS: intubated   Blood pressure 84/42, pulse 80, temperature 97.1 F (36.2 C), temperature source Oral, resp. rate 20, height 5' 6.14" (1.68 m), weight 167.9 kg (370 lb 2.4 oz), SpO2 100.00%. Physical Exam  Constitutional: She appears  well-developed and well-nourished.       Morbidly obese, critically ill on hemodialysis in ICU, intubated  HENT:  Head: Normocephalic and atraumatic.       intubated  Eyes: EOM are normal. Pupils are equal, round, and reactive to light.  Neck: Normal range of motion.  Cardiovascular: Normal rate and regular rhythm.   Respiratory: Effort normal.  GI: Soft.       Morbid obesity makes exam non-sensitive.  Genitourinary:       Foley in place with scant urine  Musculoskeletal: Normal range of motion.  Neurological: She is alert.  Skin: Skin is warm and dry.  Psychiatric: She has a normal mood and affect. Her behavior is normal. Judgment and thought content normal.    Assessment/Plan: 1 - Urosepsis + Nephrolithiasis - Guarded prognosis. Pt critically ill. Recommend Rt sided nephrostomy at first sign at non-clinical improvement. She will ultimately require stent / tube changes v. Ureteroscopy, possibly at tertiary center if she recovers enough to tolerate elective surgery.     Ayad Nieman 11/13/2012, 6:44 PM

## 2012-11-13 NOTE — Progress Notes (Signed)
Subjective:  CRRT Neg 50-75/hour All 4K fluids Still intubated Opens eyes - shook head "no" when I told her of the dialysis  Objective Vital signs in last 24 hours: Filed Vitals:   11/13/12 0630 11/13/12 0645 11/13/12 0700 11/13/12 0800  BP:   90/47 80/39  Pulse: 67 65 61 65  Temp:      TempSrc:      Resp: 23 20 13 18   Height:      Weight:      SpO2: 100% 100% 100% 100%   Weight change: -3.7 kg (-8 lb 2.5 oz)  Intake/Output Summary (Last 24 hours) at 11/13/12 0845 Last data filed at 11/13/12 0800  Gross per 24 hour  Intake 1969.88 ml  Output   3108 ml   Net -1138.12 ml  Urine output zero  Physical Exam:  Blood pressure 80/39, pulse 65, temperature 98.5 F (36.9 C), temperature source Oral, resp. rate 18, height 5' 6.14" (1.68 m), weight 167.9 kg (370 lb 2.4 oz), SpO2 100.00%. Generalized anasarca  CVP18 Lungs ant clear Massive pannus Abd wall edema 1+ LE edema Right femoral HD cath  Labs: Basic Metabolic Panel:  Lab [id number] 11/12/12 1810 11/12/12 0330 11/11/12 1544 11/11/12 0500 11/11/12 0011 11/11/12 0010 11/10/12 1724  NA 136 135 131* 132* 129* 133* 144 --  K 4.3 4.5 4.5 5.0 6.8* 7.4* 6.5* --  CL 99 98 94* 96 96 -- 99 98  CO2 25 24 22  17* 14* -- 23 16*  GLUCOSE 153* 133* 152* 164* 153* -- 84 137*  BUN 47* 54* 64* 76* 85* -- 84* 86*  CREATININE 4.37* 5.17* 6.20* 7.54* 8.89* -- 8.43* 8.98*  ALB -- -- -- -- -- -- -- --  CALCIUM 7.4* 7.0* 7.4* 7.3* 7.7* -- 7.6* 7.6*  PHOS 3.3 3.5 4.0 5.0* 6.1* -- -- --   Liver Function Tests:  Lab 11/13/12 0500 11/12/12 1810 11/12/12 0330 11/11/12 0500  AST 40* -- 40* 14  ALT 8 -- 7 <5  ALKPHOS 160* -- 141* 164*  BILITOT 0.6 -- 0.6 1.2  PROT 7.5 -- 8.2 8.3  ALBUMIN 2.0* 1.9* 2.1* --   CBC:  Lab 11/13/12 0500 11/12/12 1714 11/12/12 0330 11/11/12 1355 11/11/12 0010  WBC 27.3* 28.4* 35.3* 36.6* --  NEUTROABS 25.5* -- 33.5* 34.4* 35.5*  HGB 7.0* 6.7* 7.3* 7.5* --  HCT 21.1* 20.8* 22.5* 23.9* --  MCV 84.1 86.0  84.9 86.9 --  PLT 154 174 204 276 --   CBG:  Lab 11/13/12 0827 11/13/12 0411 11/13/12 0011 11/12/12 1953 11/12/12 1620  GLUCAP 126* 133* 142* 133* 123*    Iron Studies:  Lab 11/11/12 1310  IRON 22*  TIBC 201*  TRANSFERRIN --  FERRITIN --   Studies/Results: Ct Abdomen Pelvis Wo Contrast  11/12/2012  *RADIOLOGY REPORT*  Clinical Data:  Respiratory distress with lethargy and renal insufficiency.  Evaluate for hydronephrosis, calculi and infiltrates.  CT CHEST, ABDOMEN AND PELVIS WITHOUT CONTRAST  Technique:  Multidetector CT imaging of the chest, abdomen and pelvis was performed following the standard protocol without IV contrast.  Comparison:  Portable chest 11/12/2012, renal ultrasound 11/11/2012 and abdominal pelvic CT 05/25/2012.  CT CHEST  Findings:  Study remains limited by body habitus.  Endotracheal tube tip is in the mid trachea.  A nasogastric tube is in place. Left-sided central line extends into the upper SVC.  Within the limitations of noncontrast technique, no significantly enlarged mediastinal or hilar lymph nodes are demonstrated. There are mildly prominent axillary  lymph nodes, measuring up to 1.6 cm short axis on the left.  The heart is mildly enlarged.  There are trace bilateral pleural effusions.  There is diffuse soft tissue edema throughout the chest wall.  There are streaky air space opacities dependently in both lower lobes.  There is also linear opacity in the left upper lobe. No air bronchograms are identified and there is no endobronchial lesion.  IMPRESSION:  1.  Streaky bibasilar and left upper lobe pulmonary opacities may reflect atelectasis or pneumonia. 2.  Small bilateral pleural effusions. 3.  Anasarca with diffuse soft tissue edema. 4.  Mildly prominent axillary lymph nodes bilaterally.  CT ABDOMEN AND PELVIS  Findings:  A double-J left ureteral stent has been placed in the interval.  There are no definite stone fragments along the stent within the left ureter.   There is no residual left-sided hydronephrosis.  A tiny calyceal calculus is noted in the lower pole of the left kidney on image 84.  The right upper pole renal calyceal calculi have been fragmented in the interval.  There is a 10 mm calculus fragment obstructing the right ureteral pelvic junction on image 80.  There is a 6 mm stone fragment in the lower pole calyces on image 84.  No definite right- sided ureteral calculi are seen. The right renal pelvis and calices are moderately dilated.  The liver, spleen, gallbladder, pancreas and adrenal glands appear unchanged without definite abnormalities.  No bowel lesions are identified.  Ascites and diffuse soft tissue edema have mildly worsened.  The bladder is decompressed by a Foley catheter.  There is a right femoral line extending into the pelvis.  There are several mildly enlarged retroperitoneal lymph nodes, measuring 11 mm short axis in the left periaortic region (image 90), 12 mm adjacent to the right common iliac vein on image 98, and 13 mm along the left pelvic sidewall (image 118).  These have not substantially changed.  IMPRESSION:  1.  Left ureteral stent placement following removal of left ureteral calculi.  No residual left-sided hydronephrosis or ureteral stone fragments. 2.  Apparent lithotripsy of the right renal calculi with obstructing 10 mm stone fragment at the right ureteral pelvic junction. There are small renal calyceal stone fragments bilaterally. 3.  Increased anasarca with ascites and diffuse soft tissue edema. 4.  Stable nonspecific retroperitoneal and pelvic lymphadenopathy.   Original Report Authenticated By: Richardean Sale, M.D.    Medications:    . feeding supplement (OXEPA) 1,000 mL (11/12/12 1813)  . fentaNYL infusion INTRAVENOUS 200 mcg/hr (11/13/12 0300)  . norepinephrine (LEVOPHED) Adult infusion Stopped (11/13/12 0700)  . dialysis replacement fluid (prismasate) 300 mL/hr at 11/11/12 1830  . dialysis replacement fluid  (prismasate) 300 mL/hr at 11/12/12 1902  . dialysate (PRISMASATE) 2,000 mL/hr at 11/13/12 0535      . antiseptic oral rinse  15 mL Mouth Rinse QID  . cefTRIAXone (ROCEPHIN)  IV  1 g Intravenous Q24H  . chlorhexidine  15 mL Mouth Rinse BID  . dextrose  1 ampule Intravenous Once  . feeding supplement  60 mL Per Tube QID  . heparin  5,000 Units Subcutaneous Q8H  . insulin aspart  2-6 Units Subcutaneous Q4H  . midazolam  4 mg Intravenous Once  . pantoprazole (PROTONIX) IV  40 mg Intravenous QHS  . sodium chloride  25 mL/kg Intravenous Once     I  have reviewed scheduled and prn medications.  ASSESMENT/RECOMMENDATIONS   51 yo BF with MO, history of hidradenitis  suppurative, DMII, hypothroidism, h/o left ureteral obstruction with stones (required jj stent 05/2012), anemia, recurrent iron deficiency, systolic CHF with LVEF 123456, chronic antibiotics for skin issues, and several recurrent episodes of AKI (baseline creatinine 2.47 in Oct 2013). She presented 11/10/12 with hypercarbic respiratory failure, sepsis syndrome, LLL PNA, E.Coli bacteremia and EColi in the urine, acute renal failure, severe hyperkalemia and severe metabolic acidosis.   1. AKI on CKD (creatinine 2.47 08/2012)  Secondary to sepsis syndrome and hypoperfusion (EColi bacteremia/shock)  Pressors and dobutamine off EColi in blood and urine No obstruction by CT (h/o stones; has JJ stent) No urine output yet CRRT since 11/11/12 via right fem HD cath (12/16)  Acidosis and hyperkalemia corrected.  Continue CRRT all 4K fluids/no anticoagulation/ pulling fluid 50-75 cc/hour   2 Massive obesity   3 Sepsis (Urine)  ATB's  4 DM per primary   5 Anemia  Iron low but no IV iron while septic  Transfused  6 Sec HPTH  Level (pending)  PTH 200 (09/05/12) 662 now Start zemplar TIW (12/18)  7 Hydradenitis suppurativa   8 Hypothyroid replace IV    Jamal Maes, MD [facility] [phone] pager 11/13/2012,  8:45 AM

## 2012-11-13 NOTE — Progress Notes (Signed)
ABX dose adjustment:  Pt is morbidly obese and with bacteremia. Currently on rocephin 1g qday. Will change to 2g since she has a large volume of distribution.

## 2012-11-14 ENCOUNTER — Inpatient Hospital Stay (HOSPITAL_COMMUNITY): Payer: BC Managed Care – PPO

## 2012-11-14 LAB — RENAL FUNCTION PANEL
Albumin: 2.2 g/dL — ABNORMAL LOW (ref 3.5–5.2)
BUN: 35 mg/dL — ABNORMAL HIGH (ref 6–23)
Calcium: 7.7 mg/dL — ABNORMAL LOW (ref 8.4–10.5)
Creatinine, Ser: 2.87 mg/dL — ABNORMAL HIGH (ref 0.50–1.10)
GFR calc Af Amer: 18 mL/min — ABNORMAL LOW (ref >90)
GFR calc non Af Amer: 16 mL/min — ABNORMAL LOW (ref >90)
Glucose, Bld: 137 mg/dL — ABNORMAL HIGH (ref 70–99)
Phosphorus: 2.5 mg/dL (ref 2.3–4.6)
Phosphorus: 2.5 mg/dL (ref 2.3–4.6)
Sodium: 136 mEq/L (ref 135–145)

## 2012-11-14 LAB — TYPE AND SCREEN
ABO/RH(D): B POS
Antibody Screen: NEGATIVE
Unit division: 0
Unit division: 0
Unit division: 0

## 2012-11-14 LAB — POCT I-STAT 3, ART BLOOD GAS (G3+)
Acid-Base Excess: 2 mmol/L (ref 0.0–2.0)
O2 Saturation: 100 %
TCO2: 28 mmol/L (ref 0–100)
pO2, Arterial: 174 mmHg — ABNORMAL HIGH (ref 80.0–100.0)

## 2012-11-14 LAB — CBC
HCT: 21.9 % — ABNORMAL LOW (ref 36.0–46.0)
HCT: 24.1 % — ABNORMAL LOW (ref 36.0–46.0)
Hemoglobin: 6.7 g/dL — CL (ref 12.0–15.0)
Hemoglobin: 7.4 g/dL — ABNORMAL LOW (ref 12.0–15.0)
MCV: 89 fL (ref 78.0–100.0)
MCV: 89.3 fL (ref 78.0–100.0)
RBC: 2.7 MIL/uL — ABNORMAL LOW (ref 3.87–5.11)
RDW: 18.2 % — ABNORMAL HIGH (ref 11.5–15.5)
WBC: 20.3 10*3/uL — ABNORMAL HIGH (ref 4.0–10.5)
WBC: 19.7 10*3/uL — ABNORMAL HIGH (ref 4.0–10.5)

## 2012-11-14 LAB — CBC WITH DIFFERENTIAL/PLATELET
Eosinophils Absolute: 0.5 10*3/uL (ref 0.0–0.7)
Eosinophils Relative: 2 % (ref 0–5)
Lymphs Abs: 0.6 10*3/uL — ABNORMAL LOW (ref 0.7–4.0)
MCH: 28.3 pg (ref 26.0–34.0)
MCV: 88.4 fL (ref 78.0–100.0)
Platelets: 105 10*3/uL — ABNORMAL LOW (ref 150–400)
RBC: 2.58 MIL/uL — ABNORMAL LOW (ref 3.87–5.11)
RDW: 18 % — ABNORMAL HIGH (ref 11.5–15.5)

## 2012-11-14 LAB — APTT: aPTT: 32 seconds (ref 24–37)

## 2012-11-14 LAB — GLUCOSE, CAPILLARY

## 2012-11-14 LAB — PREPARE RBC (CROSSMATCH)

## 2012-11-14 MED ORDER — MIDAZOLAM HCL 2 MG/2ML IJ SOLN
2.0000 mg | INTRAMUSCULAR | Status: DC | PRN
  Administered 2012-11-14 – 2012-11-16 (×7): 2 mg via INTRAVENOUS
  Administered 2012-11-16: 4 mg via INTRAVENOUS
  Administered 2012-11-16 (×2): 2 mg via INTRAVENOUS
  Administered 2012-11-16: 4 mg via INTRAVENOUS
  Administered 2012-11-16 – 2012-11-17 (×2): 2 mg via INTRAVENOUS
  Administered 2012-11-17: 4 mg via INTRAVENOUS
  Administered 2012-11-17 – 2012-11-18 (×3): 2 mg via INTRAVENOUS
  Administered 2012-11-18 (×3): 4 mg via INTRAVENOUS
  Administered 2012-11-18: 2 mg via INTRAVENOUS
  Administered 2012-11-19: 4 mg via INTRAVENOUS
  Filled 2012-11-14: qty 2
  Filled 2012-11-14 (×2): qty 4
  Filled 2012-11-14 (×2): qty 2
  Filled 2012-11-14 (×2): qty 4
  Filled 2012-11-14: qty 2
  Filled 2012-11-14: qty 6
  Filled 2012-11-14: qty 4
  Filled 2012-11-14 (×2): qty 2
  Filled 2012-11-14: qty 4
  Filled 2012-11-14 (×6): qty 2
  Filled 2012-11-14 (×2): qty 4
  Filled 2012-11-14: qty 2

## 2012-11-14 MED ORDER — MIDAZOLAM HCL 2 MG/2ML IJ SOLN
2.0000 mg | Freq: Once | INTRAMUSCULAR | Status: AC
  Administered 2012-11-14: 2 mg via INTRAVENOUS

## 2012-11-14 NOTE — Progress Notes (Signed)
Subjective: remains intubated Tolerating CRRT at 50-75/hour Remains off pressors Getting blood Objective Vital signs in last 24 hours: Filed Vitals:   11/14/12 0545 11/14/12 0600 11/14/12 0615 11/14/12 0630  BP:  85/23    Pulse: 71 71 72   Temp: 98.4 F (36.9 C)   98.5 F (36.9 C)  TempSrc: Oral   Oral  Resp: 23 25 21    Height:      Weight:      SpO2: 100% 100% 100%    Weight change: -1.5 kg (-3 lb 4.9 oz)  Intake/Output Summary (Last 24 hours) at 11/14/12 [id number] Last data filed at 11/14/12 0600  Gross per 24 hour  Intake 1676.9 ml  Output   3315 ml  Net -1638.1 ml   Physical Exam:  Blood pressure 85/23, pulse 72, temperature 98.5 F (36.9 C), temperature source Oral, resp. rate 21, height 5' 6.14" (1.68 m), weight 169.8 kg (374 lb 5.5 oz), SpO2 100.00%. Generalized anasarca  CVP23  Lungs ant clear  Massive pannus  Abd wall edema  1+ LE edema  Right femoral HD cath working well  Labs: Basic Metabolic Panel:  Lab [id number] 0424 11/13/12 0500 11/12/12 1810 11/12/12 0330 11/11/12 1544 11/11/12 0500 11/11/12 0011 11/11/12 0010  NA 136 136 135 131* 132* 129* 133* --  K 4.1 4.3 4.5 4.5 5.0 6.8* 7.4* --  CL 102 99 98 94* 96 96 -- 99  CO2 26 25 24 22  17* 14* -- 23  GLUCOSE 137* 153* 133* 152* 164* 153* -- 84  BUN 38* 47* 54* 64* 76* 85* -- 84*  CREATININE 3.21* 4.37* 5.17* 6.20* 7.54* 8.89* -- 8.43*  ALB -- -- -- -- -- -- -- --  CALCIUM 7.7* 7.4* 7.0* 7.4* 7.3* 7.7* -- 7.6*  PHOS 2.5 3.3 3.5 4.0 5.0* 6.1* -- --   Liver Function Tests:  Lab 11/14/12 0424 11/13/12 0500 11/12/12 1810 11/12/12 0330 11/11/12 0500  AST -- 40* -- 40* 14  ALT -- 8 -- 7 <5  ALKPHOS -- 160* -- 141* 164*  BILITOT -- 0.6 -- 0.6 1.2  PROT -- 7.5 -- 8.2 8.3  ALBUMIN 2.1* 2.0* 1.9* -- --   No results found for this basename: LIPASE:3,AMYLASE:3 in the last 168 hours No results found for this basename: AMMONIA:3 in the last 168 hours CBC:  Lab 11/14/12 0425 11/13/12 0500 11/12/12 1714 11/12/12  0330 11/11/12 1355 11/11/12 0010  WBC 20.3* 27.3* 28.4* 35.3* -- --  NEUTROABS -- 25.5* -- 33.5* 34.4* 35.5*  HGB 6.7* 7.0* 6.7* 7.3* -- --  HCT 21.9* 21.1* 20.8* 22.5* -- --  MCV 89.0 84.1 86.0 84.9 -- --  PLT 121* 154 174 204 -- --   PT/INR: @labrcntip (inr:5) Cardiac Enzymes:  Lab 11/10/12 1724  CKTOTAL --  CKMB --  CKMBINDEX --  TROPONINI <0.30   CBG:  Lab 11/14/12 0430 11/14/12 0051 11/13/12 1948 11/13/12 1628 11/13/12 1156  GLUCAP 115* 134* 137* 129* 153*    Iron Studies:  Lab 11/11/12 1310  IRON 22*  TIBC 201*  TRANSFERRIN --  FERRITIN --   Medications:    . feeding supplement (OXEPA) 1,000 mL (11/13/12 2200)  . fentaNYL infusion INTRAVENOUS 150 mcg/hr (11/14/12 0300)  . norepinephrine (LEVOPHED) Adult infusion Stopped (11/13/12 0700)  . dialysis replacement fluid (prismasate) 300 mL/hr at 11/11/12 1830  . dialysis replacement fluid (prismasate) 300 mL/hr at 11/14/12 0504  . dialysate (PRISMASATE) 2,000 mL/hr at 11/14/12 0442      . antiseptic oral rinse  15 mL Mouth Rinse QID  . cefTRIAXone (ROCEPHIN)  IV  2 g Intravenous Q24H  . chlorhexidine  15 mL Mouth Rinse BID  . dextrose  1 ampule Intravenous Once  . feeding supplement  60 mL Per Tube QID  . heparin  5,000 Units Subcutaneous Q8H  . insulin aspart  2-6 Units Subcutaneous Q4H  . midazolam  4 mg Intravenous Once  . pantoprazole (PROTONIX) IV  40 mg Intravenous QHS  . paricalcitol  2 mcg Intravenous Q M,W,F  . sodium chloride  25 mL/kg Intravenous Once    I  have reviewed scheduled and prn medications.  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 She presented 11/10/12 with hypercarbic  respiratory failure, sepsis syndrome, LLL PNA, E.Coli bacteremia and EColi in the urine, acute renal failure, severe hyperkalemia and severe metabolic acidosis.   1. AKI on CKD (creatinine 2.47 08/2012)  Secondary to sepsis syndrome and hypoperfusion (EColi bacteremia/shock)  Pressors and dobutamine remain off  EColi in blood and urine  No obstruction by CT (h/o stones; has JJ stent)  No urine output yet  CRRT since 11/11/12 via right fem HD cath (12/16)  Acidosis and hyperkalemia corrected.  Continue CRRT all 4K fluids/no anticoagulation/ pulling fluid 50-75 cc/hour - increase to 75-125/hour   2 Massive obesity  3 Sepsis (Urine) ATB's  4 DM per primary  5 Anemia  Iron low but no IV iron while septic  Transfuse [prn  6 Sec HPTH  Level (pending)  PTH 200 (09/05/12) 662 now  Start zemplar TIW (12/18)  7 Hydradenitis suppurativa  8. Stones/JJ stent - per urology - "episode of stones 05/2012 + renal failure and CHF treated with left ureteral stent with plan for therapy once medically stable. Unfortunately she never achieved this goal Recommend Rt sided nephrostomy at first sign at non-clinical improvement. She will ultimately require stent / tube changes v. Ureteroscopy, possibly at tertiary center if she recovers enough to tolerate elective surgery"  per Dr. Tresa Moore)   Jamal Maes, MD [place] [phone] pager 11/14/2012, 6:52 AM

## 2012-11-14 NOTE — Consult Note (Addendum)
Wound care consulted for suppurative hydradenitis, bedside nurse reports multiple lesions draining pus. This  complex medical condition is beyond [facility] scope of practice.  Please consult [facility] for further plan of care. Will not plan to follow further unless re-consulted.  [address] [place], MSN, [place]

## 2012-11-14 NOTE — Progress Notes (Signed)
Name: [name] MRN: [id number] DOB: 03/22/1961    LOS: 4  PULMONARY / CRITICAL CARE MEDICINE  HPI:   51 years old morbid obese female with complex PMH relevant for systolic CHF with LVEF of 35 to 40%, HTN, DM2, hypothyroidism, chronic pain on narcotics and CKD being evaluated by nephrology for dialysis. She also has a complex history of suppurative hidradenitis and is on chronic antibiotic therapy. In icu for PNA, septic shock, ARF.   Abx: 12/15 vanc>>>12/15 12/15 zosyn>>>12/17 12/16 levofloxacin>>>12/17 12/16 ceftriaxone>>>  Cultures: 12/15 BC x 2>>>E coli sens ceftr 12/15 urine>>>E coli (variable res) 12/15 tracheal >>>  Lines: 12/15 left IJ >>> 12/15  Rt rad>>> 12/15 ETT>>> 12/16 rt fem HD >>>  Events/ Studeis: 12/15 >>> Arrest 12/15 renal US>>Suboptimal examination. There may be mild pelvicaliectasis on the<BR>right.<BR> <BR>Left kidney not visualized 12/16>>>ARF, shock, started on cvvhd 12/16 echo - 55% EF, mod reduced rv fxn, PA 46 12/17- pressors better, K resolved  12/17 CT chest- bibasilr small infiltrates / atx 12/17 CT abdo/pelvis>>>Left ureteral stent placement following removal of left  ureteral calculi. No residual left-sided hydronephrosis or ureteral stone fragments. Apparent lithotripsy of the right renal calculi with obstructing 10 mm stone fragment at the right ureteral pelvic junction. There are small renal calyceal stone fragments bilaterally. Anasarca 12/18- off pressors, weaning, awake 12/18- urology called, remains off pressors, failed weaning  Vital Signs: Temp:  [96.6 F (35.9 C)-98.8 F (37.1 C)] 96.6 F (35.9 C) (12/19 0800) Pulse Rate:  [64-93] 68  (12/19 1200) Resp:  [0-26] 23  (12/19 1200) BP: (69-110)/(23-59) 69/33 mmHg (12/19 1200) SpO2:  [98 %-100 %] 100 % (12/19 1200) Arterial Line BP: (76-147)/(41-71) 90/50 mmHg (12/19 1200) FiO2 (%):  [39.4 %-100 %] 39.9 % (12/19 1200) Weight:  [166.4 kg (366 lb 13.5 oz)-169.8 kg (374 lb 5.5  oz)] 169.8 kg (374 lb 5.5 oz) (12/19 0130)  Physical Examination: General:  Morbid obese, Intubated, mechanically ventilated Neuro:  rass 0, follows command sint HEENT:  PERRL 3 mm Neck:  Supple, no JVD   Cardiovascular:  RRR, no M/R/G Lungs:  diminshed throughout Abdomen:  Soft, nontender, nondistended, bowel sounds present, obese massive Musculoskeletal:  Moves all extremities, no pedal edema Skin: hidradenitis / majority chronic lichenified wounds  Principal Problem:  *Sepsis   ASSESSMENT AND PLAN  PULMONARY  Lab 11/14/12 0429 11/13/12 0443 11/12/12 1037 11/12/12 0505 11/11/12 1241  PHART 7.412 7.404 7.456* 7.405 7.287*  PCO2ART 42.4 41.6 34.9* 37.1 34.8*  PO2ART 174.0* 152.0* 130.0* 115.0* 173.0*  HCO3 27.0* 25.4* 24.8* 22.8 16.6*  O2SAT 100.0 99.6 99.0 99.1 99.0   Ventilator Settings: Vent Mode:  [-] PRVC FiO2 (%):  [39.4 %-100 %] 39.9 % Set Rate:  [16 bmp] 16 bmp Vt Set:  [540 mL] 540 mL PEEP:  [5 cmH20] 5 cmH20 Pressure Support:  [20 cmH20] 20 cmH20 Plateau Pressure:  [20 [id number] cmH20] 25 cmH20 CXR: Bilateral infiltrates interstial unchanged, big heart  A:   1) Acute hypoxemic and hypercarbic respiratory failure 2) Bilateral pneumonia basilar vs atx 3) r/o component ARDS P:   -lower vent needs -sbt planned, ps needed to escalate then failed again -continue cvvhd , re eval residual infiltrates -upright as able -pcxr in am   CARDIOVASCULAR  Lab 11/11/12 0010 11/10/12 2055 11/10/12 1927 11/10/12 1724  TROPONINI -- -- -- <0.30  LATICACIDVEN 11.3* 2.4* 2.53* --  PROBNP -- -- -- [id number]*   A:  1) Sepsis secondary to bilateral pneumonia / urosepsis 2) Systolic CHF  P:  -MAP goals met -continue neg balance  RENAL  Lab 11/14/12 0424 11/13/12 0500 11/12/12 1810 11/12/12 0330 11/11/12 1544 11/11/12 0500  NA 136 136 135 131* 132* --  K 4.1 4.3 -- -- -- --  CL 102 99 98 94* 96 --  CO2 26 25 24 22  17* --  BUN 38* 47* 54* 64* 76* --  CREATININE 3.21*  4.37* 5.17* 6.20* 7.54* --  CALCIUM 7.7* 7.4* 7.0* 7.4* 7.3* --  MG 2.2 2.3 -- 2.3 -- 2.4  PHOS 2.5 3.3 3.5 4.0 5.0* --   Intake/Output      12/18 0701 - 12/19 0700 12/19 0701 - 12/20 0700   I.V. (mL/kg) 795 (4.7) 157.5 (0.9)   Blood 115 235   NG/GT 775 335   IV Piggyback 100 50   Total Intake(mL/kg) 1785 (10.5) 777.5 (4.6)   Urine (mL/kg/hr)     Emesis/NG output     Other 3403 1103   Total Output 3403 1103   Net -1618 -325.5         Foley:  11/10/12  A:   1) Acute on chronic renal failure 2) Hyperkalemia resolved P:   -foley remains needed -neg balance as goal to continue -will need heparin for filter  GASTROINTESTINAL  Lab 11/14/12 0424 11/13/12 0500 11/12/12 1810 11/12/12 0330 11/11/12 1544 11/11/12 0500 11/11/12 0010  AST -- 40* -- 40* -- 14 8  ALT -- 8 -- 7 -- <5 <5  ALKPHOS -- 160* -- 141* -- 164* 174*  BILITOT -- 0.6 -- 0.6 -- 1.2 0.5  PROT -- 7.5 -- 8.2 -- 8.3 7.7  ALBUMIN 2.1* 2.0* 1.9* 2.1* 2.1* -- --    A:   1)  obese P:   -protonix Oxepa, to goal  HEMATOLOGIC  Lab 11/14/12 1021 11/14/12 0425 11/14/12 0424 11/13/12 0500 11/12/12 1714 11/12/12 0930 11/12/12 0330 11/11/12 0500 11/11/12 0010  HGB 7.4* 6.7* -- 7.0* 6.7* -- 7.3* -- --  HCT 24.1* 21.9* -- 21.1* 20.8* -- 22.5* -- --  PLT 118* 121* -- 154 174 -- 204 -- --  INR -- -- -- -- -- 1.69* -- -- 1.78*  APTT -- -- 32 35 -- 36 36 39* --   A:   1) Anemia likely due to chronic disease and sepsis / dilution P:  -s/p 1 unit prbc, cbc reviewed post Tx -cbc in am  -neg balance goals -CT no bleeding, no bleeding clinically, OK to use heparin for filter, will assess cbc in pm as well  INFECTIOUS  Lab 11/14/12 1021 11/14/12 0425 11/13/12 0500 11/12/12 1714 11/12/12 0330 11/10/12 2055  WBC 19.7* 20.3* 27.3* 28.4* 35.3* --  PROCALCITON -- -- -- -- -- 81.37    A:   1) Septic shock-E coli, urine source in setting of stent - Bilateral pneumonia bases mild - Suppurative hidradenitis P:   -continue  ceftriaxone, goal 14 days treatment, may need extended -urology consult - first sign at non-clinical improvement. She will ultimately require stent / tube changes v. Ureteroscopy, possibly at tertiary center if she recovers enough to tolerate elective surgery.  -she has continued to progress  ENDOCRINE  Lab 11/14/12 0430 11/14/12 0051 11/13/12 1948 11/13/12 1628 11/13/12 1156  GLUCAP 115* 134* 137* 129* 153*   A:   1) DM 2 controlled 2 rel AI r/o P:   - ICU hyperglycemia protocol with SQ Novolog  controlled  NEUROLOGIC  A:   1) Altered mental status likely due to sepsis and hypercarbia  2) Intubated, sedated P:   - WUA daily -fent at 200, reduce daily as able -uprigth as able  BEST PRACTICE / DISPOSITION - Level of Care:  ICU - Primary Service:  PCCM - Consultants:  Nephrology, urology - Code Status:  Full code - Diet:  Tf 12/17>>> - DVT Px:  Heparin sq, may need IV - GI Px:  Protonix IV - Skin Integrity:  Severe suppurative hidradenitis  - Social / Family: none at bedside  The patient is critically ill with multiple organ systems failure and requires high complexity decision making for assessment and support, frequent evaluation and titration of therapies, application of advanced monitoring technologies and extensive interpretation of multiple databases.   Critical Care Time devoted to patient care services described in this note is: 30 min  Lavon Paganini. Titus Mould, MD, [place] Pgr: [facility]

## 2012-11-14 NOTE — Progress Notes (Signed)
eLink Physician-Brief Progress Note Patient Name: Dalarie Longtin DOB: 24-Nov-1961 MRN: [id number]  Date of Service  11/14/2012   HPI/Events of Note  Hgb of 6.7 in patient on CRRT   eICU Interventions  Plan: Transfuse 1 unit of pRBCs Post-transfusion CBC   Intervention Category Intermediate Interventions: Bleeding - evaluation and treatment with blood products  Joscelyn Hardrick 11/14/2012, 4:52 AM

## 2012-11-14 NOTE — Progress Notes (Signed)
[facility] Progress Note Patient Name: [name] DOB: [birth year] MRN: [id number]  Date of Service  11/14/2012   HPI/Events of Note  Call from nurse reporting that patient is now waking up - agitated, attempting to pull at lines, etc.  Camera evaluation confirms this report.  Currently maxed out on fentanyl at 300 mcg with prns not holding her.  No other sedation ordered.  eICU Interventions  Plan: Versed bolus with prn order.  If this does hold patient will consider cont versed gtt for additional sedation needs.   Intervention Category Minor Interventions: Agitation / anxiety - evaluation and management;Communication with other healthcare providers and/or family  Saiquan Hands 11/14/2012, 2:54 AM

## 2012-11-14 NOTE — Progress Notes (Signed)
CRITICAL VALUE ALERT  Critical value received:  Hgb 6.7  Date of notification:  11-14-12  Time of notification: 0450  Critical value read back:yes  Nurse who received alert:  York Pellant RN  MD notified (1st page): E Deterding  Time of first page:  [phone]  Responding MD:  E Deterding  Time MD responded:  [phone]

## 2012-11-15 ENCOUNTER — Other Ambulatory Visit: Payer: Self-pay | Admitting: Urology

## 2012-11-15 ENCOUNTER — Inpatient Hospital Stay (HOSPITAL_COMMUNITY): Payer: BC Managed Care – PPO

## 2012-11-15 LAB — GLUCOSE, CAPILLARY
Glucose-Capillary: 104 mg/dL — ABNORMAL HIGH (ref 70–99)
Glucose-Capillary: 101 mg/dL — ABNORMAL HIGH (ref 70–99)

## 2012-11-15 LAB — RENAL FUNCTION PANEL
BUN: 33 mg/dL — ABNORMAL HIGH (ref 6–23)
CO2: 28 mEq/L (ref 19–32)
CO2: 27 mEq/L (ref 19–32)
Calcium: 7.9 mg/dL — ABNORMAL LOW (ref 8.4–10.5)
Chloride: 101 mEq/L (ref 96–112)
Creatinine, Ser: 2.59 mg/dL — ABNORMAL HIGH (ref 0.50–1.10)
Creatinine, Ser: 2.25 mg/dL — ABNORMAL HIGH (ref 0.50–1.10)
GFR calc Af Amer: 24 mL/min — ABNORMAL LOW (ref >90)
Glucose, Bld: 118 mg/dL — ABNORMAL HIGH (ref 70–99)

## 2012-11-15 LAB — POCT ACTIVATED CLOTTING TIME
Activated Clotting Time: 165 seconds
Activated Clotting Time: 165 seconds
Activated Clotting Time: 170 seconds
Activated Clotting Time: 176 seconds
Activated Clotting Time: 181 seconds

## 2012-11-15 LAB — CBC
MCV: 90 fL (ref 78.0–100.0)
Platelets: 110 10*3/uL — ABNORMAL LOW (ref 150–400)
RDW: 18.3 % — ABNORMAL HIGH (ref 11.5–15.5)
WBC: 21.2 10*3/uL — ABNORMAL HIGH (ref 4.0–10.5)

## 2012-11-15 LAB — CBC WITH DIFFERENTIAL/PLATELET
Basophils Absolute: 0 10*3/uL (ref 0.0–0.1)
Basophils Relative: 0 % (ref 0–1)
Eosinophils Absolute: 0.5 10*3/uL (ref 0.0–0.7)
Eosinophils Relative: 2 % (ref 0–5)
HCT: 24 % — ABNORMAL LOW (ref 36.0–46.0)
MCH: 27.9 pg (ref 26.0–34.0)
MCHC: 30.8 g/dL (ref 30.0–36.0)
MCV: 90.6 fL (ref 78.0–100.0)
Monocytes Absolute: 0.8 10*3/uL (ref 0.1–1.0)
Neutro Abs: 18.9 10*3/uL — ABNORMAL HIGH (ref 1.7–7.7)
RDW: 18.1 % — ABNORMAL HIGH (ref 11.5–15.5)

## 2012-11-15 LAB — TYPE AND SCREEN
ABO/RH(D): B POS
Antibody Screen: NEGATIVE
Unit division: 0

## 2012-11-15 LAB — TSH: TSH: 6.152 u[IU]/mL — ABNORMAL HIGH (ref 0.350–4.500)

## 2012-11-15 LAB — MAGNESIUM: Magnesium: 2.3 mg/dL (ref 1.5–2.5)

## 2012-11-15 MED ORDER — OXEPA PO LIQD
1000.0000 mL | ORAL | Status: DC
  Administered 2012-11-16: 1000 mL
  Filled 2012-11-15 (×4): qty 1000

## 2012-11-15 MED ORDER — HEPARIN BOLUS VIA INFUSION (CRRT)
1000.0000 [IU] | INTRAVENOUS | Status: DC | PRN
  Filled 2012-11-15 (×2): qty 1000

## 2012-11-15 MED ORDER — INFLUENZA VIRUS VACC SPLIT PF IM SUSP
0.5000 mL | INTRAMUSCULAR | Status: DC
  Filled 2012-11-15: qty 0.5

## 2012-11-15 MED ORDER — FERUMOXYTOL INJECTION 510 MG/17 ML
510.0000 mg | INTRAVENOUS | Status: AC
  Administered 2012-11-15 – 2012-11-18 (×2): 510 mg via INTRAVENOUS
  Filled 2012-11-15 (×3): qty 17

## 2012-11-15 MED ORDER — LEVOTHYROXINE SODIUM 25 MCG PO TABS
25.0000 ug | ORAL_TABLET | Freq: Every morning | ORAL | Status: DC
Start: 2012-11-15 — End: 2012-11-15
  Filled 2012-11-15: qty 1

## 2012-11-15 MED ORDER — PNEUMOCOCCAL VAC POLYVALENT 25 MCG/0.5ML IJ INJ
0.5000 mL | INJECTION | INTRAMUSCULAR | Status: DC
  Filled 2012-11-15: qty 0.5

## 2012-11-15 MED ORDER — HEPARIN SODIUM (PORCINE) 1000 UNIT/ML DIALYSIS
1000.0000 [IU] | INTRAMUSCULAR | Status: DC | PRN
  Administered 2012-11-16: 3200 [IU] via INTRAVENOUS_CENTRAL
  Administered 2012-11-17: 4000 [IU] via INTRAVENOUS_CENTRAL
  Filled 2012-11-15 (×2): qty 4
  Filled 2012-11-15 (×2): qty 6

## 2012-11-15 MED ORDER — SODIUM CHLORIDE 0.9 % IJ SOLN
250.0000 [IU]/h | INTRAMUSCULAR | Status: DC
  Administered 2012-11-15: 250 [IU]/h via INTRAVENOUS_CENTRAL
  Administered 2012-11-16: 1300 [IU]/h via INTRAVENOUS_CENTRAL
  Administered 2012-11-17 (×2): 1850 [IU]/h via INTRAVENOUS_CENTRAL
  Filled 2012-11-15 (×8): qty 2

## 2012-11-15 MED ORDER — LEVOTHYROXINE SODIUM 25 MCG PO TABS
25.0000 ug | ORAL_TABLET | Freq: Every day | ORAL | Status: DC
  Administered 2012-11-15: 25 ug via ORAL
  Filled 2012-11-15 (×3): qty 1

## 2012-11-15 MED ORDER — PANTOPRAZOLE SODIUM 40 MG PO PACK
40.0000 mg | PACK | Freq: Every day | ORAL | Status: DC
  Administered 2012-11-15 – 2012-11-20 (×5): 40 mg
  Filled 2012-11-15 (×8): qty 20

## 2012-11-15 NOTE — Progress Notes (Signed)
I spoke with ICU team and will see patient tonight to get consent i reviewed films i recommend removal of left ureteral stent and placement of rt ureteral stent for rt stone  She would need ureteroscopy in >4 weeks after UTI settles Will arrange carelink tomorrow for case

## 2012-11-15 NOTE — Progress Notes (Signed)
Name: [name] MRN: [id number] DOB: 11-Jul-1961    LOS: 5  PULMONARY / CRITICAL CARE MEDICINE  HPI:   51 years old morbid obese female with complex PMH relevant for systolic CHF with LVEF of 35 to 40%, HTN, DM2, hypothyroidism, chronic pain on narcotics and CKD being evaluated by nephrology for dialysis. She also has a complex history of suppurative hidradenitis and is on chronic antibiotic therapy. In icu for PNA, septic shock, ARF.   Abx: 12/15 vanc>>>12/15 12/15 zosyn>>>12/17 12/16 levofloxacin>>>12/17 12/16 ceftriaxone>>>  Cultures: 12/15 BC x 2>>>E coli sens ceftr 12/15 urine>>>E coli (variable res) 12/15 tracheal >>>NF  Lines: 12/15 left IJ >>> 12/15  Rt rad>>>12/19 12/15 ETT>>> 12/16 rt fem HD >>>  Events/ Studeis: 12/15 >>> Arrest 12/15 renal US>>Suboptimal examination. There may be mild pelvicaliectasis on the<BR>right.<BR> <BR>Left kidney not visualized 12/16>>>ARF, shock, started on cvvhd 12/16 echo - 55% EF, mod reduced rv fxn, PA 46 12/17- pressors better, K resolved  12/17 CT chest- bibasilr small infiltrates / atx 12/17 CT abdo/pelvis>>>Left ureteral stent placement following removal of left  ureteral calculi. No residual left-sided hydronephrosis or ureteral stone fragments. Apparent lithotripsy of the right renal calculi with obstructing 10 mm stone fragment at the right ureteral pelvic junction. There are small renal calyceal stone fragments bilaterally. Anasarca 12/18- off pressors, weaning, awake 12/18- urology called, remains off pressors, failed weaning 12/20- neg balance  Vital Signs: Temp:  [97.7 F (36.5 C)-98.2 F (36.8 C)] 98.1 F (36.7 C) (12/20 0428) Pulse Rate:  [64-88] 77  (12/20 0700) Resp:  [12-28] 21  (12/20 0700) BP: (69-132)/(27-77) 111/60 mmHg (12/20 0700) SpO2:  [99 %-100 %] 100 % (12/20 0700) Arterial Line BP: (76-125)/(44-67) 111/63 mmHg (12/19 1530) FiO2 (%):  [39.7 %-40.3 %] 39.9 % (12/20 0700) Weight:  [165.1 kg (363 lb  15.7 oz)] 165.1 kg (363 lb 15.7 oz) (12/20 0200)  Physical Examination: General:  Morbid obese, Intubated, mechanically ventilated Neuro:  rass 0, follows commands HEENT:  PERRL 3 mm Neck:  Supple, no JVD   Cardiovascular:  RRR, no M/R/G Lungs:  diminshed throughout Abdomen:  Soft, nontender, nondistended, bowel sounds present improved, obese massive Musculoskeletal:  Moves all extremities Skin: hidradenitis / majority chronic lichenified wounds  Principal Problem:  *Sepsis   ASSESSMENT AND PLAN  PULMONARY  Lab 11/14/12 0429 11/13/12 0443 11/12/12 1037 11/12/12 0505 11/11/12 1241  PHART 7.412 7.404 7.456* 7.405 7.287*  PCO2ART 42.4 41.6 34.9* 37.1 34.8*  PO2ART 174.0* 152.0* 130.0* 115.0* 173.0*  HCO3 27.0* 25.4* 24.8* 22.8 16.6*  O2SAT 100.0 99.6 99.0 99.1 99.0   Ventilator Settings: Vent Mode:  [-] PRVC FiO2 (%):  [39.7 %-40.3 %] 39.9 % Set Rate:  [16 bmp] 16 bmp Vt Set:  [540 mL] 540 mL PEEP:  [5 cmH20] 5 cmH20 Plateau Pressure:  [21 cmH20-26 cmH20] 24 cmH20 CXR: Bilateral infiltrates interstial unchanged, big heart  A:   1) Acute hypoxemic and hypercarbic respiratory failure 2) Bilateral pneumonia basilar vs atx 3) r/o component ARDS P:   -had negative balance successful -remains on min support on vent -wean cpap5 ps 5, goal 30 min, assess rsbi -may have component ards, refelcted on pcxr -continue neg balance  CARDIOVASCULAR  Lab 11/11/12 0010 11/10/12 2055 11/10/12 1927 11/10/12 1724  TROPONINI -- -- -- <0.30  LATICACIDVEN 11.3* 2.4* 2.53* --  PROBNP -- -- -- [id number]   A:  1) Sepsis secondary to bilateral pneumonia / urosepsis 2) Systolic CHF  P:  -MAP goals met -continue neg balance  as BP has tolerated  RENAL  Lab 11/15/12 0445 11/14/12 1515 11/14/12 0424 11/13/12 0500 11/12/12 1810 11/12/12 0330 11/11/12 0500  NA 137 134* 136 136 135 -- --  K 4.1 4.0 -- -- -- -- --  CL 102 100 102 99 98 -- --  CO2 28 26 26 25 24  -- --  BUN 35* 35* 38* 47*  54* -- --  CREATININE 2.59* 2.87* 3.21* 4.37* 5.17* -- --  CALCIUM 7.9* 7.9* 7.7* 7.4* 7.0* -- --  MG 2.3 -- 2.2 2.3 -- 2.3 2.4  PHOS 2.7 2.5 2.5 3.3 3.5 -- --   Intake/Output      12/19 0701 - 12/20 0700 12/20 0701 - 12/21 0700   I.V. (mL/kg) 822.5 (5) 35 (0.2)   Blood 235    NG/GT 1220 55   IV Piggyback 60    Total Intake(mL/kg) 2337.5 (14.2) 90 (0.5)   Urine (mL/kg/hr) 8 (0) 10   Other 4885 149   Total Output 4893 159   Net -2555.5 -69         Foley:  11/10/12  A:   1) Acute on chronic renal failure 2) Hyperkalemia resolved P:   -foley remains needed -neg balance as goal to continue on cvvhd -will need heparin for filter, OK with that -see ID, urology plan -to int hd?  GASTROINTESTINAL  Lab 11/15/12 0445 11/14/12 1515 11/14/12 0424 11/13/12 0500 11/12/12 1810 11/12/12 0330 11/11/12 0500 11/11/12 0010  AST -- -- -- 40* -- 40* 14 8  ALT -- -- -- 8 -- 7 <5 <5  ALKPHOS -- -- -- 160* -- 141* 164* 174*  BILITOT -- -- -- 0.6 -- 0.6 1.2 0.5  PROT -- -- -- 7.5 -- 8.2 8.3 7.7  ALBUMIN 2.1* 2.2* 2.1* 2.0* 1.9* -- -- --    A:   1)  obese P:   -protonix Oxepa, to goal BM  Smears noted May need colace  HEMATOLOGIC  Lab 11/15/12 0445 11/14/12 2359 11/14/12 1515 11/14/12 1021 11/14/12 0425 11/14/12 0424 11/13/12 0500 11/12/12 0930 11/12/12 0330 11/11/12 0500 11/11/12 0010  HGB 7.4* 7.5* 7.3* 7.4* 6.7* -- -- -- -- -- --  HCT 24.0* 24.4* 22.8* 24.1* 21.9* -- -- -- -- -- --  PLT 107* 110* 105* 118* 121* -- -- -- -- -- --  INR -- -- -- -- -- -- -- 1.69* -- -- 1.78*  APTT -- -- -- -- -- 32 35 36 36 39* --   A:   1) Anemia likely due to chronic disease and sepsis / dilution P:   -neg balance goals, hope will concetrate -CT no bleeding, no bleeding clinically, OK to use heparin for filter, will assess cbc in pm as well  INFECTIOUS  Lab 11/15/12 0445 11/14/12 2359 11/14/12 1515 11/14/12 1021 11/14/12 0425 11/10/12 2055  WBC 21.2* 21.2* 21.5* 19.7* 20.3* --   PROCALCITON -- -- -- -- -- 81.37    A:   1) Septic shock-E coli, urine source in setting of stent - Bilateral pneumonia bases mild - Suppurative hidradenitis P:   -continue ceftriaxone, goal 14 days treatment, may need extended, pending urology findings -urology consult - first sign at non-clinical improvement. She will ultimately require stent / tube changes v. Ureteroscopy, possibly at tertiary center if she recovers enough to tolerate elective surgery.  -d/w urology this am , will transfer to [facility] once on schedule for operation than back to [facility] when safe, as will ned HD further  ENDOCRINE  Lab  11/15/12 0423 11/15/12 0013 11/14/12 2019 11/14/12 1622 11/14/12 1243  GLUCAP 104* 127* 122* 133* 135*   A:   1) DM 2 controlled 2 rel AI r/o 3hypothyroidism known P:   - ICU hyperglycemia protocol with SQ Novolog  Controlled Tsh, start home synthroid  NEUROLOGIC  A:   1) Altered mental status likely due to sepsis and hypercarbia 2) Intubated, sedated P:   - WUA daily -fent at 200, may need a patch -uprigth as able  BEST PRACTICE / DISPOSITION - Level of Care:  ICU - Primary Service:  PCCM - Consultants:  Nephrology, urology ([facility]) - Code Status:  Full code - Diet:  Tf 12/17>>> - DVT Px:  Heparin sq, may need IV for filter issues - GI Px:  Protonix IV - Skin Integrity:  Severe suppurative hidradenitis  - Social / Family: updated 12/19  The patient is critically ill with multiple organ systems failure and requires high complexity decision making for assessment and support, frequent evaluation and titration of therapies, application of advanced monitoring technologies and extensive interpretation of multiple databases.   Critical Care Time devoted to patient care services described in this note is: 30 min  Lavon Paganini. Titus Mould, MD, [place] Pgr: [facility]

## 2012-11-15 NOTE — Progress Notes (Addendum)
Subjective:  Wide awake In mittens Follows commands Still no urine output Remains off pressors Probs with filter clotting  Objective Vital signs in last 24 hours: Filed Vitals:   11/15/12 0828 11/15/12 0830 11/15/12 0832 11/15/12 0900  BP:  118/67  134/76  Pulse: 78 76  89  Temp:   97.5 F (36.4 C)   TempSrc:   Oral   Resp: 17 13  15   Height:      Weight:      SpO2: 100% 100%  100%  Weights: 11/15/12 0200 ! 165.1 kg (363 lb 15.7 oz) -- -- [facility]  11/14/12 0130 ! 169.8 kg (374 lb 5.5 oz) -- -- [facility]  11/14/12 0100 ! 166.4 kg (366 lb 13.5 oz) -- -- CP  11/13/12 0500 ! 167.9 kg (370 lb 2.4 oz) -- -- CP  11/12/12 0400 ! 171.6 kg (378 lb 5 oz) -- -- [facility]  11/11/12 0500 ! 171.8 kg (378 lb 12 oz) -- -- BM  11/10/12 2215 ! 171.8 kg (378   Weight change: -1.3 kg (-2 lb 13.9 oz)  Intake/Output Summary (Last 24 hours) at 11/15/12 0905 Last data filed at 11/15/12 0900  Gross per 24 hour  Intake 2102.5 ml  Output   4250 ml  Net -2147.5 ml   Physical Exam:  Blood pressure 134/76, pulse 89, temperature 97.5 F (36.4 C), temperature source Oral, resp. rate 15, height 5' 6.14" (1.68 m), weight 165.1 kg (363 lb 15.7 oz), SpO2 100.00%. Morbidly obese BF Awake and alert In mittens Lungs grossly clear anteriorly Heart sounds distant Massive pannus No focal tenderness Abd wall edema Right fem HD cath 2+ LE edema  Labs: Basic Metabolic Panel:  Lab [id number] 0445 11/14/12 1515 11/14/12 0424 11/13/12 0500 11/12/12 1810 11/12/12 0330 11/11/12 1544  NA 137 134* 136 136 135 131* 132*  K 4.1 4.0 4.1 4.3 4.5 4.5 5.0  CL 102 100 102 99 98 94* 96  CO2 28 26 26 25 24 22  17*  GLUCOSE 118* 165* 137* 153* 133* 152* 164*  BUN 35* 35* 38* 47* 54* 64* 76*  CREATININE 2.59* 2.87* 3.21* 4.37* 5.17* 6.20* 7.54*  ALB -- -- -- -- -- -- --  CALCIUM 7.9* 7.9* 7.7* 7.4* 7.0* 7.4* 7.3*  PHOS 2.7 2.5 2.5 3.3 3.5 4.0 5.0*   Liver Function Tests:  Lab 11/15/12 0445 11/14/12 1515 11/14/12 0424 11/13/12 0500  11/12/12 0330 11/11/12 0500  AST -- -- -- 40* 40* 14  ALT -- -- -- 8 7 <5  ALKPHOS -- -- -- 160* 141* 164*  BILITOT -- -- -- 0.6 0.6 1.2  PROT -- -- -- 7.5 8.2 8.3  ALBUMIN 2.1* 2.2* 2.1* -- -- --  CBC:  Lab 11/15/12 0445 11/14/12 2359 11/14/12 1515 11/14/12 1021 11/13/12 0500 11/12/12 0330  WBC 21.2* 21.2* 21.5* 19.7* -- --  NEUTROABS 18.9* -- 19.5* -- 25.5* 33.5*  HGB 7.4* 7.5* 7.3* 7.4* -- --  HCT 24.0* 24.4* 22.8* 24.1* -- --  MCV 90.6 90.0 88.4 89.3 -- --  PLT 107* 110* 105* 118* -- --   CBG:  Lab 11/15/12 0825 11/15/12 0423 11/15/12 0013 11/14/12 2019 11/14/12 1622  GLUCAP 101* 104* 127* 122* 133*    Iron Studies:  Lab 11/11/12 1310  IRON 22*  TIBC 201*  TRANSFERRIN --  FERRITIN --   Studies/Results: Dg Chest Port 1 View  11/15/2012  *RADIOLOGY REPORT*  Clinical Data: Assess endotracheal tube position.  PORTABLE CHEST - 1 VIEW  Comparison: Chest  x-ray 11/14/2012.  Findings: An endotracheal tube is in place with tip 2.8 cm above the carina. A nasogastric tube is seen extending into the stomach, however, the tip of the nasogastric tube extends below the lower margin of the image.  Worsening opacities in the left lung, particularly in the left upper lobe, concerning for developing multilobar pneumonia.  No definite pleural effusions.  Crowding of the pulmonary vasculature, accentuated by low lung volumes, without frank pulmonary edema.  Mild cardiomegaly is unchanged. The patient is rotated to the right on today's exam, resulting in distortion of the mediastinal contours and reduced diagnostic sensitivity and specificity for mediastinal pathology.  IMPRESSION: 1.  Support apparatus, as above. 2.  Findings concerning for worsening multilobar pneumonia in the left lung, particularly in the left upper lobe.   Original Report Authenticated By: Vinnie Langton, M.D.    Dg Chest Port 1 View  11/14/2012  *RADIOLOGY REPORT*  Clinical Data: Evaluate endotracheal tube and lungs   PORTABLE CHEST - 1 VIEW  Comparison: 11/13/2012  Findings: Endotracheal tube tip projects 2.5 cm proximal to the carina.  Left IJ catheter tip projects over the left brachiocephalic vein/SVC confluence.  Cardiac enlargement.  Central vascular congestion.  Interstitial prominence.  Elevated right hemidiaphragm.  NG tube descends below the level of the image.  No acute osseous finding.  IMPRESSION: Endotracheal tube tip 2.5 cm above the carina.  Hypoaeration with interstitial and vascular crowding.  Cardiomegaly with mild edema.   Original Report Authenticated By: Carlos Levering, M.D.    Medications:    . feeding supplement (OXEPA) 1,000 mL (11/15/12 0842)  . fentaNYL infusion INTRAVENOUS 150 mcg/hr (11/15/12 0425)  . norepinephrine (LEVOPHED) Adult infusion Stopped (11/13/12 0700)  . dialysis replacement fluid (prismasate) 300 mL/hr at 11/14/12 1459  . dialysis replacement fluid (prismasate) 500 mL/hr at 11/15/12 [id number]  . dialysate (PRISMASATE) 2,000 mL/hr at 11/15/12 0730      . antiseptic oral rinse  15 mL Mouth Rinse QID  . cefTRIAXone (ROCEPHIN)  IV  2 g Intravenous Q24H  . chlorhexidine  15 mL Mouth Rinse BID  . dextrose  1 ampule Intravenous Once  . feeding supplement  60 mL Per Tube QID  . heparin  5,000 Units Subcutaneous Q8H  . insulin aspart  2-6 Units Subcutaneous Q4H  . levothyroxine  25 mcg Oral QAC breakfast  . midazolam  4 mg Intravenous Once  . pantoprazole (PROTONIX) IV  40 mg Intravenous QHS  . paricalcitol  2 mcg Intravenous Q M,W,F  . sodium chloride  25 mL/kg Intravenous Once    I  have reviewed scheduled and prn medications.  ASSESMENT/RECOMMENDATIONS   51 yo BF with MO, history of hidradenitis suppurative, DMII, hypothroidism, h/o left ureteral obstruction with stones (required jj stent 05/2012), anemia, recurrent iron deficiency, systolic CHF with LVEF [id number], chronic antibiotics for skin issues, and several recurrent episodes of AKI (baseline creatinine 2.47 in  Oct 2013). She presented 11/10/12 with hypercarbic respiratory failure, sepsis syndrome, LLL PNA, E.Coli bacteremia and EColi in the urine, acute renal failure, severe hyperkalemia and severe metabolic acidosis.   1. AKI on CKD (creatinine 2.47 08/2012) CRRT since 11/11/12 via right fem HD cath (12/16)   Secondary to sepsis syndrome and hypoperfusion (EColi bacteremia/shock)   Pressors and dobutamine remain off   EColi in blood and urine   No obstruction by CT (h/o stones; has JJ stent) but urology wants to change stent and stent other side - planned for tomorrow at  [facility]  No urine output yet   Weight coming down  Acidosis and hyperkalemia corrected.   Continue CRRT all 4K fluids/no anticoagulation/ pulling fluid 75-125/hour cc/hour  Add heparin for filter clotting (OK with CCM)   Unclear if this will be acute or chronic  If no evidence recovery  through the weekend will need permcath placed on left side 2 Massive obesity  3 Sepsis (Urine) ATB's  4 DM per primary  5 Anemia   Sepsis controlled  Give IV iron  Transfuse prn  6 Sec HPTH   PTH 200 (09/05/12) 662 now   Started zemplar TIW (12/18)  7 Hydradenitis suppurativa  8. Stones/JJ stent - per urology - "episode of stones 05/2012 + renal failure  treated with left ureteral stent with plan for therapy once medically stable. Unfortunately she never achieved this goal   Urology plans care link transport to [facility] tomorrow for change of JJ stent and placement of stent on opposite side   Can turn CVVH off and cap catheter for her trip to [place], [place] [phone] pager 11/15/2012, 9:05 AM

## 2012-11-16 ENCOUNTER — Encounter (HOSPITAL_COMMUNITY): Payer: Self-pay | Admitting: Anesthesiology

## 2012-11-16 ENCOUNTER — Inpatient Hospital Stay (HOSPITAL_COMMUNITY): Payer: BC Managed Care – PPO | Admitting: Anesthesiology

## 2012-11-16 ENCOUNTER — Inpatient Hospital Stay (HOSPITAL_COMMUNITY): Payer: BC Managed Care – PPO

## 2012-11-16 ENCOUNTER — Encounter (HOSPITAL_COMMUNITY)
Admission: EM | Disposition: A | Discharge status: Home / Self Care | Payer: Self-pay | Source: Home / Self Care | Attending: Internal Medicine

## 2012-11-16 HISTORY — PX: CYSTOSCOPY W/ RETROGRADES: SHX1426

## 2012-11-16 LAB — CBC WITH DIFFERENTIAL/PLATELET
Basophils Relative: 0 % (ref 0–1)
Hemoglobin: 7 g/dL — ABNORMAL LOW (ref 12.0–15.0)
Lymphocytes Relative: 6 % — ABNORMAL LOW (ref 12–46)
Lymphs Abs: 1.1 10*3/uL (ref 0.7–4.0)
Monocytes Relative: 3 % (ref 3–12)
Neutro Abs: 16.3 10*3/uL — ABNORMAL HIGH (ref 1.7–7.7)
Neutrophils Relative %: 88 % — ABNORMAL HIGH (ref 43–77)
RBC: 2.55 MIL/uL — ABNORMAL LOW (ref 3.87–5.11)

## 2012-11-16 LAB — APTT: aPTT: 41 seconds — ABNORMAL HIGH (ref 24–37)

## 2012-11-16 LAB — RENAL FUNCTION PANEL
Albumin: 2.1 g/dL — ABNORMAL LOW (ref 3.5–5.2)
Albumin: 2.1 g/dL — ABNORMAL LOW (ref 3.5–5.2)
BUN: 34 mg/dL — ABNORMAL HIGH (ref 6–23)
Calcium: 8.1 mg/dL — ABNORMAL LOW (ref 8.4–10.5)
GFR calc Af Amer: 31 mL/min — ABNORMAL LOW (ref >90)
GFR calc Af Amer: 27 mL/min — ABNORMAL LOW (ref >90)
Phosphorus: 2.5 mg/dL (ref 2.3–4.6)
Phosphorus: 2.7 mg/dL (ref 2.3–4.6)
Potassium: 4 mEq/L (ref 3.5–5.1)
Potassium: 3.9 mEq/L (ref 3.5–5.1)
Sodium: 135 mEq/L (ref 135–145)
Sodium: 136 mEq/L (ref 135–145)

## 2012-11-16 LAB — POCT ACTIVATED CLOTTING TIME
Activated Clotting Time: 181 seconds
Activated Clotting Time: 181 seconds
Activated Clotting Time: 182 seconds

## 2012-11-16 LAB — PROTIME-INR: INR: 1.23 (ref 0.00–1.49)

## 2012-11-16 LAB — GLUCOSE, CAPILLARY
Glucose-Capillary: 102 mg/dL — ABNORMAL HIGH (ref 70–99)
Glucose-Capillary: 110 mg/dL — ABNORMAL HIGH (ref 70–99)
Glucose-Capillary: 112 mg/dL — ABNORMAL HIGH (ref 70–99)
Glucose-Capillary: 132 mg/dL — ABNORMAL HIGH (ref 70–99)
Glucose-Capillary: 97 mg/dL (ref 70–99)

## 2012-11-16 LAB — MAGNESIUM: Magnesium: 2.3 mg/dL (ref 1.5–2.5)

## 2012-11-16 SURGERY — CYSTOSCOPY, WITH RETROGRADE PYELOGRAM
Anesthesia: General | Site: Ureter | Laterality: Bilateral | Wound class: Contaminated

## 2012-11-16 MED ORDER — FENTANYL CITRATE 0.05 MG/ML IJ SOLN
25.0000 ug | INTRAMUSCULAR | Status: DC | PRN
  Administered 2012-11-16 (×2): 50 ug via INTRAVENOUS

## 2012-11-16 MED ORDER — SACCHAROMYCES BOULARDII 250 MG PO CAPS
250.0000 mg | ORAL_CAPSULE | Freq: Two times a day (BID) | ORAL | Status: DC
  Filled 2012-11-16 (×2): qty 1

## 2012-11-16 MED ORDER — SODIUM CHLORIDE 0.9 % IV SOLN
INTRAVENOUS | Status: DC | PRN
  Administered 2012-11-16: 07:00:00 via INTRAVENOUS

## 2012-11-16 MED ORDER — ALBUTEROL SULFATE HFA 108 (90 BASE) MCG/ACT IN AERS
8.0000 | INHALATION_SPRAY | RESPIRATORY_TRACT | Status: DC | PRN
  Filled 2012-11-16: qty 6.7

## 2012-11-16 MED ORDER — 0.9 % SODIUM CHLORIDE (POUR BTL) OPTIME
TOPICAL | Status: DC | PRN
  Administered 2012-11-16: 1000 mL

## 2012-11-16 MED ORDER — FUROSEMIDE 80 MG PO TABS
240.0000 mg | ORAL_TABLET | Freq: Two times a day (BID) | ORAL | Status: DC
  Filled 2012-11-16 (×2): qty 3

## 2012-11-16 MED ORDER — VITAMIN C 500 MG PO TABS
500.0000 mg | ORAL_TABLET | Freq: Every day | ORAL | Status: DC
  Filled 2012-11-16: qty 1

## 2012-11-16 MED ORDER — CYCLOBENZAPRINE HCL 10 MG PO TABS: 10.0000 mg | ORAL_TABLET | Freq: Three times a day (TID) | ORAL | Status: DC | PRN

## 2012-11-16 MED ORDER — ZINC SULFATE 220 (50 ZN) MG PO CAPS
220.0000 mg | ORAL_CAPSULE | Freq: Every day | ORAL | Status: DC
  Filled 2012-11-16: qty 1

## 2012-11-16 MED ORDER — IOHEXOL 300 MG/ML  SOLN
INTRAMUSCULAR | Status: DC | PRN
  Administered 2012-11-16: 40 mL

## 2012-11-16 MED ORDER — DULOXETINE HCL 60 MG PO CPEP
60.0000 mg | ORAL_CAPSULE | Freq: Every day | ORAL | Status: DC
  Filled 2012-11-16: qty 1

## 2012-11-16 MED ORDER — ASPIRIN EC 81 MG PO TBEC
81.0000 mg | DELAYED_RELEASE_TABLET | Freq: Every day | ORAL | Status: DC
  Filled 2012-11-16: qty 1

## 2012-11-16 MED ORDER — OXYCODONE-ACETAMINOPHEN 5-325 MG PO TABS: 1.0000 | ORAL_TABLET | Freq: Four times a day (QID) | ORAL | Status: DC | PRN

## 2012-11-16 MED ORDER — SODIUM CHLORIDE 0.9 % IR SOLN: 1000.0000 mL | Freq: Three times a day (TID) | Status: DC

## 2012-11-16 MED ORDER — CISATRACURIUM BESYLATE (PF) 10 MG/5ML IV SOLN
INTRAVENOUS | Status: DC | PRN
  Administered 2012-11-16: 10 mg via INTRAVENOUS

## 2012-11-16 MED ORDER — FENTANYL CITRATE 0.05 MG/ML IJ SOLN
INTRAMUSCULAR | Status: DC | PRN
  Administered 2012-11-16 (×2): 25 ug via INTRAVENOUS
  Administered 2012-11-16: 50 ug via INTRAVENOUS

## 2012-11-16 MED ORDER — SEVELAMER CARBONATE 800 MG PO TABS
2400.0000 mg | ORAL_TABLET | Freq: Three times a day (TID) | ORAL | Status: DC
  Filled 2012-11-16 (×3): qty 3

## 2012-11-16 MED ORDER — CARVEDILOL 6.25 MG PO TABS
6.2500 mg | ORAL_TABLET | Freq: Two times a day (BID) | ORAL | Status: DC
  Filled 2012-11-16 (×2): qty 1

## 2012-11-16 MED ORDER — ISOSORBIDE MONONITRATE ER 30 MG PO TB24
30.0000 mg | ORAL_TABLET | Freq: Every day | ORAL | Status: DC
  Filled 2012-11-16: qty 1

## 2012-11-16 MED ORDER — PROMETHAZINE HCL 25 MG/ML IJ SOLN: 6.2500 mg | INTRAMUSCULAR | Status: DC | PRN

## 2012-11-16 MED ORDER — LEVOTHYROXINE SODIUM 25 MCG PO TABS
25.0000 ug | ORAL_TABLET | Freq: Every day | ORAL | Status: DC
  Administered 2012-11-17 – 2012-11-19 (×3): 25 ug
  Filled 2012-11-16 (×5): qty 1

## 2012-11-16 MED ORDER — STERILE WATER FOR IRRIGATION IR SOLN
Status: DC | PRN
  Administered 2012-11-16: 3000 mL

## 2012-11-16 MED ORDER — FERROUS SULFATE 325 (65 FE) MG PO TABS
325.0000 mg | ORAL_TABLET | Freq: Two times a day (BID) | ORAL | Status: DC
  Filled 2012-11-16 (×2): qty 1

## 2012-11-16 MED ORDER — SODIUM BICARBONATE 650 MG PO TABS
1300.0000 mg | ORAL_TABLET | Freq: Three times a day (TID) | ORAL | Status: DC
Start: 2012-11-16 — End: 2012-11-16
  Filled 2012-11-16 (×3): qty 2

## 2012-11-16 MED ORDER — FOLIC ACID 1 MG PO TABS
1.0000 mg | ORAL_TABLET | Freq: Every day | ORAL | Status: DC
  Filled 2012-11-16: qty 1

## 2012-11-16 MED ORDER — SIMVASTATIN 20 MG PO TABS
20.0000 mg | ORAL_TABLET | Freq: Every day | ORAL | Status: DC
  Filled 2012-11-16: qty 1

## 2012-11-16 MED ORDER — MAGNESIUM OXIDE 400 MG PO TABS
400.0000 mg | ORAL_TABLET | Freq: Two times a day (BID) | ORAL | Status: DC
  Filled 2012-11-16 (×3): qty 1

## 2012-11-16 MED ORDER — MIDAZOLAM HCL 5 MG/5ML IJ SOLN
INTRAMUSCULAR | Status: DC | PRN
  Administered 2012-11-16 (×2): 1 mg via INTRAVENOUS

## 2012-11-16 MED ORDER — IPRATROPIUM BROMIDE HFA 17 MCG/ACT IN AERS
8.0000 | INHALATION_SPRAY | Freq: Four times a day (QID) | RESPIRATORY_TRACT | Status: DC
  Administered 2012-11-16 – 2012-11-20 (×15): 8 via RESPIRATORY_TRACT
  Filled 2012-11-16: qty 12.9

## 2012-11-16 MED ORDER — ALBUTEROL SULFATE HFA 108 (90 BASE) MCG/ACT IN AERS
8.0000 | INHALATION_SPRAY | Freq: Four times a day (QID) | RESPIRATORY_TRACT | Status: DC
Start: 2012-11-16 — End: 2012-11-20
  Administered 2012-11-16 – 2012-11-20 (×15): 8 via RESPIRATORY_TRACT
  Filled 2012-11-16 (×2): qty 6.7

## 2012-11-16 SURGICAL SUPPLY — 23 items
ADAPTER CATH URET PLST 4-6FR (CATHETERS) ×1 IMPLANT
ADPR CATH URET STRL DISP 4-6FR (CATHETERS)
BAG URINE DRAINAGE (UROLOGICAL SUPPLIES) ×1 IMPLANT
BAG URO CATCHER STRL LF (DRAPE) ×3 IMPLANT
CATH FOLEY 2WAY SLVR  5CC 18FR (CATHETERS) ×1
CATH FOLEY 2WAY SLVR 5CC 18FR (CATHETERS) IMPLANT
CATH URET 5FR 28IN OPEN ENDED (CATHETERS) ×2 IMPLANT
CLOTH BEACON ORANGE TIMEOUT ST (SAFETY) ×2 IMPLANT
DRAPE CAMERA CLOSED 9X96 (DRAPES) ×1 IMPLANT
ELECT REM PT RETURN 9FT ADLT (ELECTROSURGICAL) ×2
ELECTRODE REM PT RTRN 9FT ADLT (ELECTROSURGICAL) IMPLANT
GLOVE BIOGEL M STRL SZ7.5 (GLOVE) ×1 IMPLANT
GOWN PREVENTION PLUS XLARGE (GOWN DISPOSABLE) ×2 IMPLANT
GOWN STRL REIN XL XLG (GOWN DISPOSABLE) ×2 IMPLANT
GUIDEWIRE STR DUAL SENSOR (WIRE) ×2 IMPLANT
LUBRICANT JELLY K Y 4OZ (MISCELLANEOUS) ×1 IMPLANT
MANIFOLD NEPTUNE II (INSTRUMENTS) ×1 IMPLANT
NS IRRIG 1000ML POUR BTL (IV SOLUTION) ×1 IMPLANT
PACK CYSTO (CUSTOM PROCEDURE TRAY) ×2 IMPLANT
STENT CONTOUR 6FRX24X.038 (STENTS) ×1 IMPLANT
SYR 20CC LL (SYRINGE) ×2 IMPLANT
TUBING CONNECTING 10 (TUBING) ×1 IMPLANT
WATER STERILE IRR 1500ML POUR (IV SOLUTION) ×1 IMPLANT

## 2012-11-16 NOTE — Preoperative (Signed)
Beta Blockers   Reason not to administer Beta Blockers:Patient history of septic shock and levophed drip with this admission.

## 2012-11-16 NOTE — Progress Notes (Addendum)
Subjective:  Just returned from [facility] Cystoscopy, left ureter stent removal, bilateral retrograde pyelogram, right ureter stent placement, fulguration bladder mucosa Thick white sediment from foley CRRT was held for procedure  Objective Vital signs in last 24 hours: Filed Vitals:   11/16/12 1013 11/16/12 1014 11/16/12 1015 11/16/12 1016  BP:      Pulse: 101 100 99 98  Temp:      TempSrc:      Resp: 22 20 13 7   Height:      Weight:      SpO2: 100% 100% 100% 100%   Weight change: -4.6 kg (-10 lb 2.3 oz)  Intake/Output Summary (Last 24 hours) at 11/16/12 1102 Last data filed at 11/16/12 0930  Gross per 24 hour  Intake 1980.67 ml  Output   3570 ml  Net -1589.33 ml   WEIGHTS: 11/16/12             160.5 kg (353 lb)  11/15/12 0200 ! 165.1 kg (363 lb)  11/14/12 0130 ! 169.8 kg (374 lb) 11/14/12 0100 ! 166.4 kg (366 lb)  11/13/12 0500 ! 167.9 kg (370 lb)   11/12/12 0400 ! 171.6 kg (378 lb) 11/11/12 0500 ! 171.8 kg (378 lb) 11/10/12 2215 ! 171.8 kg (378)   Physical Exam:  Blood pressure 148/86, pulse 98, temperature 98.5 F (36.9 C), temperature source Oral, resp. rate 7, height 5' 6.14" (1.68 m), weight 160.5 kg (353 lb 13.4 oz), SpO2 100.00%. Hard of hearing (? How long? Intubated Coarse rhoncous bs anteriorly Regular S1S2 No S3 Still with abdominal wall edema Much improved edema Maceration groins Right fem cath in place Small amount urine in foley looks like milk/pus  Labs: Basic Metabolic Panel:  Lab [id number] 0242 11/15/12 1543 11/15/12 0445 11/14/12 1515 11/14/12 0424 11/13/12 0500 11/12/12 1810  NA 135 137 137 134* 136 136 135  K 4.0 4.1 4.1 4.0 4.1 4.3 4.5  CL 101 101 102 100 102 99 98  CO2 27 27 28 26 26 25 24   GLUCOSE 116* 140* 118* 165* 137* 153* 133*  BUN 34* 33* 35* 35* 38* 47* 54*  CREATININE 2.07* 2.25* 2.59* 2.87* 3.21* 4.37* 5.17*  ALB -- -- -- -- -- -- --  CALCIUM 8.0* 7.9* 7.9* 7.9* 7.7* 7.4* 7.0*  PHOS 2.5 2.3 2.7 2.5 2.5 3.3 3.5   Liver  Function Tests:  Lab 11/16/12 0242 11/15/12 1543 11/15/12 0445 11/13/12 0500 11/12/12 0330 11/11/12 0500  AST -- -- -- 40* 40* 14  ALT -- -- -- 8 7 <5  ALKPHOS -- -- -- 160* 141* 164*  BILITOT -- -- -- 0.6 0.6 1.2  PROT -- -- -- 7.5 8.2 8.3  ALBUMIN 2.1* 2.1* 2.1* -- -- --   CBC:  Lab 11/16/12 0242 11/15/12 0445 11/14/12 2359 11/14/12 1515 11/13/12 0500  WBC 18.5* 21.2* 21.2* 21.5* --  NEUTROABS 16.3* 18.9* -- 19.5* 25.5*  HGB 7.0* 7.4* 7.5* 7.3* --  HCT 23.5* 24.0* 24.4* 22.8* --  MCV 92.2 90.6 90.0 88.4 --  PLT 112* 107* 110* 105* --   CBG:  Lab 11/16/12 0828 11/16/12 0329 11/16/12 0034 11/15/12 2008 11/15/12 1538  GLUCAP 102* 109* 110* 104* 129*   Studies/Results: Dg Chest Port 1 View  11/16/2012  *RADIOLOGY REPORT*  Clinical Data: Evaluate endotracheal tube position  PORTABLE CHEST - 1 VIEW  Comparison: Portable chest x-ray of 11/15/2012  Findings: The tip of the endotracheal is approximately 3.9 cm above the carina.  There is little change in  diffuse airspace disease although aeration may have improved slightly.  Cardiomegaly is stable.  Left central venous line tip overlies the mid SVC.  IMPRESSION:  1.  Tip of endotracheal tube 3.9 cm above the carina. 2.  Minimally improved aeration.  Persistent cardiomegaly with little change in diffuse airspace disease.   Original Report Authenticated By: Ivar Drape, M.D.    Dg Chest Port 1 View  11/15/2012  *RADIOLOGY REPORT*  Clinical Data: Assess endotracheal tube position.  PORTABLE CHEST - 1 VIEW  Comparison: Chest x-ray 11/14/2012.  Findings: An endotracheal tube is in place with tip 2.8 cm above the carina. A nasogastric tube is seen extending into the stomach, however, the tip of the nasogastric tube extends below the lower margin of the image.  Worsening opacities in the left lung, particularly in the left upper lobe, concerning for developing multilobar pneumonia.  No definite pleural effusions.  Crowding of the pulmonary  vasculature, accentuated by low lung volumes, without frank pulmonary edema.  Mild cardiomegaly is unchanged. The patient is rotated to the right on today's exam, resulting in distortion of the mediastinal contours and reduced diagnostic sensitivity and specificity for mediastinal pathology.  IMPRESSION: 1.  Support apparatus, as above. 2.  Findings concerning for worsening multilobar pneumonia in the left lung, particularly in the left upper lobe.   Original Report Authenticated By: Vinnie Langton, M.D.    Medications:    . fentaNYL infusion INTRAVENOUS 150 mcg/hr (11/16/12 0718)  . heparin 10,000 units/ 20 mL infusion syringe Stopped (11/16/12 0400)  . norepinephrine (LEVOPHED) Adult infusion Stopped (11/13/12 0700)  . dialysis replacement fluid (prismasate) 300 mL/hr at 11/15/12 2249  . dialysis replacement fluid (prismasate) 500 mL/hr at 11/15/12 2249  . dialysate (PRISMASATE) 2,000 mL/hr at 11/16/12 0127      . antiseptic oral rinse  15 mL Mouth Rinse QID  . cefTRIAXone (ROCEPHIN)  IV  2 g Intravenous Q24H  . chlorhexidine  15 mL Mouth Rinse BID  . dextrose  1 ampule Intravenous Once  . feeding supplement (OXEPA)  1,000 mL Per Tube Q24H  . feeding supplement  60 mL Per Tube QID  . ferumoxytol  510 mg Intravenous Q3 days  . influenza  inactive virus vaccine  0.5 mL Intramuscular Tomorrow-1000  . insulin aspart  2-6 Units Subcutaneous Q4H  . levothyroxine  25 mcg Oral QAC breakfast  . midazolam  4 mg Intravenous Once  . pantoprazole sodium  40 mg Per Tube QHS  . paricalcitol  2 mcg Intravenous Q M,W,F  . pneumococcal 23 valent vaccine  0.5 mL Intramuscular Tomorrow-1000  . sodium chloride  25 mL/kg Intravenous Once    I  have reviewed scheduled and prn medications.  ASSESMENT/RECOMMENDATIONS   51 yo BF with MO, history of hidradenitis suppurative, DMII, hypothroidism, h/o left ureteral obstruction with stones (required jj stent 05/2012), anemia, recurrent iron deficiency,  systolic CHF with LVEF 123456, chronic antibiotics for skin issues, and several recurrent episodes of AKI (baseline creatinine 2.47 in Oct 2013). She presented 11/10/12 with hypercarbic respiratory failure, sepsis syndrome, LLL PNA, E.Coli bacteremia and EColi in the urine, acute renal failure, severe hyperkalemia and severe metabolic acidosis.   1. AKI on CKD (creatinine 2.47 08/2012)   CRRT since 11/11/12 via right fem HD cath (12/16)   Weight down 25 lbs with CRRT still with some edema  Secondary to sepsis syndrome and hypoperfusion (EColi bacteremia/shock)   Pressors and dobutamine remain off   EColi in blood and urine  S/p  Cystoscopy, left ureter stent removal, bilateral retrograde pyelogram, right ureter stent placement, fulguration bladder mucosa  Acidosis and hyperkalemia corrected.   Continue CRRT all 4K fluids/no anticoagulation/ pulling fluid 75-125/hour cc/hour with heparin for another 24 hours at least (still diffuse ASDz on CXR plus )  Re-eval for cont'd CRRT tomorrow - if no evidence recovery will need PCath on left side on Monday   Unclear how much renal recovery she will get at this time  2 Massive obesity  3 Sepsis (Urine) ATB's  4 DM per primary  5 Anemia   IV iron (ordered for 2 doses)  Transfuse prn  6 Sec HPTH   PTH 200 (09/05/12) 662 now   Started zemplar TIW (12/18)  7 Hydradenitis suppurativa   8. Stones/JJ stent - per urology - "episode of stones 05/2012 + renal failure treated with left ureteral stent with plan for therapy once medically stable. Unfortunately she never achieved this goal S/p  Cystoscopy, left ureter stent removal, bilateral retrograde pyelogram, right ureter stent placement, fulguration bladder mucosa today for obstructive process right side       Jamal Maes, MD [place] pager 11/16/2012, 11:02 AM

## 2012-11-16 NOTE — Transfer of Care (Signed)
Immediate Anesthesia Transfer of Care Note  Patient: [name]  Procedure(s) Performed: Procedure(s) (LRB) with comments: CYSTOSCOPY WITH RETROGRADE PYELOGRAM (Bilateral) - CYSTOSCOPY,BILATERAL RETROGRADE PYELOGRAM/REMOVAL LEFT URETERAL STENT/FULGERATION BLADDER MUCOSA/INSERTION RIGHT URETERAL STENT  Patient Location: PACU  Anesthesia Type:General  Level of Consciousness: Patient remains intubated per anesthesia plan  Airway & Oxygen Therapy: Patient remains intubated per anesthesia plan and Patient placed on Ventilator (see vital sign flow sheet for setting)  Post-op Assessment: Report given to PACU RN and Post -op Vital signs reviewed and stable  Post vital signs: Reviewed and stable  Complications: No apparent anesthesia complications

## 2012-11-16 NOTE — Progress Notes (Signed)
Pt is intubated and indicated via hand  signs and writing that she is unable to hear either speach or the TV. Pt does not have a Hx of hearing loss either in chart or per the patient.

## 2012-11-16 NOTE — Progress Notes (Signed)
Name: [name] MRN: [id number] DOB: Mar 16, 1961    LOS: 6  PULMONARY / CRITICAL CARE MEDICINE  HPI:   51 years old morbid obese female with complex PMH relevant for systolic CHF with LVEF of 35 to 40%, HTN, DM2, hypothyroidism, chronic pain on narcotics and CKD being evaluated by nephrology for dialysis. She also has a complex history of suppurative hidradenitis and is on chronic antibiotic therapy. In icu for PNA, septic shock, ARF.   Abx: 12/15 vanc>>>12/15 12/15 zosyn>>>12/17 12/16 levofloxacin>>>12/17 12/16 ceftriaxone>>>  Cultures: 12/15 BC x 2>>>E coli sens ceftr 12/15 urine>>>E coli (variable res) 12/15 tracheal >>>NF  Lines: 12/15 left IJ >>> 12/15  Rt rad>>>12/19 12/15 ETT>>> 12/16 rt fem HD >>>  Events/ Studeis: 12/15 >>> Arrest 12/15 renal US>>Suboptimal examination. There may be mild pelvicaliectasis on the<BR>right.<BR> <BR>Left kidney not visualized 12/16>>>ARF, shock, started on cvvhd 12/16 echo - 55% EF, mod reduced rv fxn, PA 46 12/17- pressors better, K resolved  12/17 CT chest- bibasilr small infiltrates / atx 12/17 CT abdo/pelvis>>>Left ureteral stent placement following removal of left  ureteral calculi. No residual left-sided hydronephrosis or ureteral stone fragments. Apparent lithotripsy of the right renal calculi with obstructing 10 mm stone fragment at the right ureteral pelvic junction. There are small renal calyceal stone fragments bilaterally. Anasarca 12/18- off pressors, weaning, awake 12/18- urology called, remains off pressors, failed weaning 12/20- neg balance 12/21 >tranx [facility] for uro proced>Cystoscopy, left ureter stent removal, bilateral retrograde pyelogram, right ureter stent placement   Vital Signs: Temp:  [96.8 F (36 C)-98.5 F (36.9 C)] 98.5 F (36.9 C) (12/21 0930) Pulse Rate:  [67-134] 100  (12/21 1100) Resp:  [0-27] 7  (12/21 1016) BP: (93-161)/(48-94) 123/58 mmHg (12/21 1100) SpO2:  [96 %-100 %] 100 % (12/21 1100) FiO2  (%):  [39.7 %-100 %] 40 % (12/21 1144) Weight:  [160.5 kg (353 lb 13.4 oz)] 160.5 kg (353 lb 13.4 oz) (12/21 0500)  Physical Examination: General:  Morbid obese, Intubated, mechanically ventilated Neuro:  rass 0, follows commands HEENT:  PERRL 3 mm Neck:  Supple, no JVD   Cardiovascular:  RRR, no M/R/G Lungs:  diminshed throughout Abdomen:  Soft, nontender, nondistended, bowel sounds present improved, obese massive Musculoskeletal:  Moves all extremities Skin: hidradenitis / majority chronic lichenified wounds  Principal Problem:  *Sepsis   ASSESSMENT AND PLAN  PULMONARY  Lab 11/14/12 0429 11/13/12 0443 11/12/12 1037 11/12/12 0505 11/11/12 1241  PHART 7.412 7.404 7.456* 7.405 7.287*  PCO2ART 42.4 41.6 34.9* 37.1 34.8*  PO2ART 174.0* 152.0* 130.0* 115.0* 173.0*  HCO3 27.0* 25.4* 24.8* 22.8 16.6*  O2SAT 100.0 99.6 99.0 99.1 99.0   Ventilator Settings: Vent Mode:  [-] PRVC FiO2 (%):  [39.7 %-100 %] 40 % Set Rate:  [16 bmp] 16 bmp Vt Set:  [540 mL] 540 mL PEEP:  [4.7 cmH20-5 cmH20] 5 cmH20 Plateau Pressure:  [23 cmH20-35 cmH20] 23 cmH20 CXR: Bilateral infiltrates interstial unchanged, big heart  A:   1) Acute hypoxemic and hypercarbic respiratory failure 2) Bilateral pneumonia basilar vs atx 3) r/o component ARDS P:     -remains on min support on vent -wean cpap5 ps 5, goal 30 min, assess rsbi -may have component ards, refelcted on pcxr -continue neg balance  CARDIOVASCULAR  Lab 11/11/12 0010 11/10/12 2055 11/10/12 1927 11/10/12 1724  TROPONINI -- -- -- <0.30  LATICACIDVEN 11.3* 2.4* 2.53* --  PROBNP -- -- -- [id number]   A:  1) Sepsis secondary to bilateral pneumonia / urosepsis 2) Systolic CHF  P:  -MAP goals met -continue neg balance as BP has tolerated  RENAL  Lab 11/16/12 0242 11/15/12 1543 11/15/12 0445 11/14/12 1515 11/14/12 0424 11/13/12 0500 11/12/12 0330  NA 135 137 137 134* 136 -- --  K 4.0 4.1 -- -- -- -- --  CL 101 101 102 100 102 -- --   CO2 27 27 28 26 26  -- --  BUN 34* 33* 35* 35* 38* -- --  CREATININE 2.07* 2.25* 2.59* 2.87* 3.21* -- --  CALCIUM 8.0* 7.9* 7.9* 7.9* 7.7* -- --  MG 2.3 -- 2.3 -- 2.2 2.3 2.3  PHOS 2.5 2.3 2.7 2.5 2.5 -- --   Intake/Output      12/20 0701 - 12/21 0700 12/21 0701 - 12/22 0700   I.V. (mL/kg) 765 (4.8) 568.7 (3.5)   Blood     NG/GT 1092    IV Piggyback 50    Total Intake(mL/kg) 1907 (11.9) 568.7 (3.5)   Urine (mL/kg/hr) 45 (0)    Other 4199    Total Output 4244    Net -2337 +568.7         Foley:  11/10/12  A:   1) Acute on chronic renal failure 2) Hyperkalemia resolved >s/p right ureter stent 12/21 P:   -foley remains needed -neg balance as goal to continue on cvvhd    GASTROINTESTINAL  Lab 11/16/12 0242 11/15/12 1543 11/15/12 0445 11/14/12 1515 11/14/12 0424 11/13/12 0500 11/12/12 0330 11/11/12 0500 11/11/12 0010  AST -- -- -- -- -- 40* 40* 14 8  ALT -- -- -- -- -- 8 7 <5 <5  ALKPHOS -- -- -- -- -- 160* 141* 164* 174*  BILITOT -- -- -- -- -- 0.6 0.6 1.2 0.5  PROT -- -- -- -- -- 7.5 8.2 8.3 7.7  ALBUMIN 2.1* 2.1* 2.1* 2.2* 2.1* -- -- -- --    A:   1)  obese P:   -protonix Oxepa, to goal     HEMATOLOGIC  Lab 11/16/12 0242 11/15/12 0825 11/15/12 0445 11/14/12 2359 11/14/12 1515 11/14/12 1021 11/14/12 0424 11/13/12 0500 11/12/12 0930 11/11/12 0010  HGB 7.0* -- 7.4* 7.5* 7.3* 7.4* -- -- -- --  HCT 23.5* -- 24.0* 24.4* 22.8* 24.1* -- -- -- --  PLT 112* -- 107* 110* 105* 118* -- -- -- --  INR 1.23 1.32 -- -- -- -- -- -- 1.69* 1.78*  APTT 41* 38* -- -- -- -- 32 35 36 --   A:   1) Anemia likely due to chronic disease and sepsis / dilution P:   -neg balance goals, hope will concetrate -CT no bleeding, no bleeding clinically,   -off hep >SCD    INFECTIOUS  Lab 11/16/12 0242 11/15/12 0445 11/14/12 2359 11/14/12 1515 11/14/12 1021 11/10/12 2055  WBC 18.5* 21.2* 21.2* 21.5* 19.7* --  PROCALCITON -- -- -- -- -- 81.37    A:   1) Septic shock-E coli, urine  source in setting of stent - Bilateral pneumonia bases mild - Suppurative hidradenitis P:   -continue ceftriaxone, goal 14 days treatment, may need extended, pending urology findings  ENDOCRINE  Lab 11/16/12 0828 11/16/12 0329 11/16/12 0034 11/15/12 2008 11/15/12 1538  GLUCAP 102* 109* 110* 104* 129*   A:   1) DM 2 controlled 2 rel AI r/o 3hypothyroidism known P:   - ICU hyperglycemia protocol with SQ Novolog  Controlled Restart synthroid per tube   NEUROLOGIC  A:   1) Altered mental status likely due to sepsis and hypercarbia  2) Intubated, sedated P:   Cont to monitor   BEST PRACTICE / DISPOSITION - Level of Care:  ICU - Primary Service:  PCCM - Consultants:  Nephrology, urology (scott [facility]dermit) - Code Status:  Full code - Diet:  Tf 12/17>>> - DVT Px:  SCD  - GI Px:  Protonix IV - Skin Integrity:  Severe suppurative hidradenitis  - Social / Family: updated 12/19   35 mins CCM time Family updated @ bedside  Merton Border, MD ; [facility] [phone].  After 5:30 PM or weekends, call [phone]

## 2012-11-16 NOTE — Progress Notes (Signed)
Telephonic and bedside report given to Oswaldo Done and [place] EMT-P from [place]. Care assumed by them  for transport to [facility].

## 2012-11-16 NOTE — Anesthesia Postprocedure Evaluation (Signed)
  Anesthesia Post-op Note  Patient: [name]  Procedure(s) Performed: Procedure(s) (LRB): CYSTOSCOPY WITH RETROGRADE PYELOGRAM (Bilateral)  Patient Location: PACU  Anesthesia Type: General  Level of Consciousness: awake and alert   Airway and Oxygen Therapy: Patient Spontanous Breathing  Post-op Pain: mild  Post-op Assessment: Post-op Vital signs reviewed, Patient's Cardiovascular Status Stable, Respiratory Function Stable, Patent Airway and No signs of Nausea or vomiting  Last Vitals:  Filed Vitals:   11/16/12 0903  BP:   Pulse: 100  Temp:   Resp: 16    Post-op Vital Signs: stable   Complications: No apparent anesthesia complications. Back to [facility] hospital via [place] as per plan.

## 2012-11-16 NOTE — Anesthesia Preprocedure Evaluation (Addendum)
Anesthesia Evaluation  Patient identified by MRN, date of birth, ID band Patient awake    Reviewed: Allergy & Precautions, H&P , NPO status , Patient's Chart, lab work & pertinent test results, reviewed documented beta blocker date and time   Airway Mallampati: II TM Distance: >3 FB Neck ROM: full    Dental No notable dental hx.    Pulmonary neg pulmonary ROS, shortness of breath,  Intubated on vent PRVC RR 25 Minute volume about 13, 100% fiO2, PEEP 5 Acute hypoxemic and hypercarbic respiratory failure, bilateral pneumonia, acidosis breath sounds clear to auscultation  Pulmonary exam normal       Cardiovascular Exercise Tolerance: Good hypertension, Pt. on medications +CHF negative cardio ROS  Rhythm:regular Rate:Normal  LVEF 35-40%, HTN   Neuro/Psych PSYCHIATRIC DISORDERS negative neurological ROS  negative psych ROS   GI/Hepatic negative GI ROS, Neg liver ROS, GERD-  ,  Endo/Other  negative endocrine ROSdiabetes, Type 2Hypothyroidism   Renal/GU Renal diseaseRecent septic shock, ARF.  negative genitourinary   Musculoskeletal   Abdominal   Peds  Hematology negative hematology ROS (+) anemia , Hgb 7.0   Anesthesia Other Findings Patient transferred from [facility] - presently intubated and on fentanyl drip.  Reproductive/Obstetrics negative OB ROS                           Anesthesia Physical Anesthesia Plan  ASA: IV and emergent  Anesthesia Plan: General   Post-op Pain Management:    Induction:   Airway Management Planned: Oral ETT  Additional Equipment:   Intra-op Plan:   Post-operative Plan: Post-operative intubation/ventilation  Informed Consent: I have reviewed the patients History and Physical, chart, labs and discussed the procedure including the risks, benefits and alternatives for the proposed anesthesia with the patient or authorized representative who has  indicated his/her understanding and acceptance.   Dental Advisory Given  Plan Discussed with: CRNA  Anesthesia Plan Comments: (Discussed with patient who is able to write questions on paper.)       Anesthesia Quick Evaluation

## 2012-11-16 NOTE — Op Note (Signed)
Urology Operative Report  Date of Procedure: 10/2112  Surgeon: Rolan Bucco, MD Assistant: None  Preoperative Diagnosis: Sepsis. Right obstructing ureter stone. Postoperative Diagnosis:  Same  Procedure(s): Cystoscopy Left ureter stent removal Bilateral retrograde pyelogram Right ureter stent placement Fulguration bladder mucosa  Estimated blood loss: None  Specimen: None  Drains: Foley catheter 18 French  Complications: None  Findings: Return of heavy urinary sediment with placement of right ureter stent. No filling defect on left retrograde pyelogram.  History of present illness: 51 year old female with urosepsis. This is being caused by a right ureter obstruction with a right renal stone. The patient remains intubated and in critical condition. She was able to improve her condition to be weaned off of intravenous pressure support.  It was felt that this was a window of opportunity to take her to the operating room to relieve the blockage by the stone of the right kidney. I discussed this with the patient through writing notes. Dr. Matilde Sprang also discussed this with her family.   Procedure in detail: After informed consent was obtained, the patient was taken to the operating room. They were placed in the supine position. SCDs were turned on and in place. IV antibiotics were infused, and general anesthesia was induced. A timeout was performed in which the correct patient, surgical site, and procedure were identified and agreed upon by the team.  The patient was placed in a dorsolithotomy position, making sure to pad all pertinent neurovascular pressure points. The genitals were prepped and draped in the usual sterile fashion.   I advanced a rigid cystoscope through the urethra and into the bladder. The bladder was fully distended and evaluated in a systematic fashion. There were no lesions noted. Attention was turned to the left ureter stent. This was grasped with a stent  grasper and removed in its entirety. Next I attempted to shoot a retrograde pyelogram, but there was problems with the radiology equipment. This delayed [place] for 30 minutes. The equipment was able to be exchanged and we were able to achieve a left retrograde Polygram one I inserted a 5 [place] ureter catheter into the left ureter orifice and injected contrast. There was no filling defect indicating no residual stone. There was noted to be an abrasion close to her left ureter orifice which had some bleeding. I was able to fulgurate this with the [place] electrode without any damage to the ureter orifice. There was good hemostasis.  Attention was turned to the right ureter orifice. It was cannulated with a 5 [place] ureter catheter and I injected contrast to obtain a retrograde Polygram on the right. Contrast could not move beyond the area that appeared to be her right ureteropelvic junction. I then placed a sensor tip wire up the right ureter and this was able to go up into the right renal pelvis. There was return of urinary sediment. I then placed a 6 x 24 double-J ureter stent without the strings in place with ease. There was return of more heavy urinary sediment.  The cystoscope was removed and an 69 French Foley catheter was placed into her bladder with 10 cc of sterile water injected into the balloon. This completed the procedure. She remained intubated and taken to the PACU. She'll be transferred back to [facility] hospital via [place].  I contacted the patient's husband regarding the surgical findings and the course of the surgery. I also attempted to communicate these to the patient.

## 2012-11-16 NOTE — Progress Notes (Signed)
Urology Progress Note  Subjective:     No acute urologic events overnight. Patient is intubated and sedated. She is septic. I have tried to communicate with her, but she appears sedated and cannot interact.   I have discussed her case with Dr. Matilde [name] and reviewed the films myself. He was able to obtain informed consent from her family last night. She has a right 1 cm UPJ stone and urosepsis. Left ueter stone appears to have passed.   Patient is critically ill, and there is risk transporting her. I do not know what we will encounter regarding equipment needs and I agree with Dr. Matilde [name] that surgery at [facility]. This appears to the the best window to attempt drainage of the kidney from her health point of view as this appears to be the most stable she has been from what I can tell, since admitted.   ROS: No able to obtain due to patient condition.  Objective:  Patient Vitals for the past 24 hrs:  BP Temp Temp src Pulse Resp SpO2  11/16/12 0600 135/64 mmHg - - 82  25  100 %  11/16/12 0500 127/68 mmHg - - 81  22  100 %  11/16/12 0400 144/73 mmHg - - 81  25  100 %  11/16/12 0338 134/70 mmHg - - 75  27  100 %  11/16/12 0330 - 97.7 F (36.5 C) Oral - - -  11/16/12 0300 116/69 mmHg - - 70  21  100 %  11/16/12 0200 103/65 mmHg - - 74  24  100 %  11/16/12 0100 121/66 mmHg - - 77  25  100 %  11/16/12 0037 - 97.6 F (36.4 C) Oral - - -  11/16/12 0000 129/94 mmHg - - 82  23  100 %  11/15/12 2320 131/71 mmHg - - 84  26  100 %  11/15/12 2300 131/71 mmHg - - 82  0  100 %  11/15/12 2200 97/57 mmHg - - 72  22  100 %  11/15/12 2100 102/61 mmHg - - 79  23  100 %  11/15/12 2000 120/64 mmHg 96.8 F (36 C) Oral 77  23  100 %  11/15/12 1954 119/64 mmHg - - 77  23  100 %  11/15/12 1900 115/68 mmHg - - 76  6  100 %  11/15/12 1830 103/55 mmHg - - 71  7  100 %  11/15/12 1800 113/64 mmHg - - 80  23  100 %  11/15/12 1730 103/56 mmHg - - 72  24  100 %  11/15/12 1700 93/51 mmHg - - 84  21  100 %  11/15/12  1630 124/72 mmHg - - 84  18  100 %  11/15/12 1619 113/63 mmHg - - 81  23  100 %  11/15/12 1600 113/63 mmHg 97.2 F (36.2 C) Oral 73  25  100 %  11/15/12 1530 99/54 mmHg - - 74  24  100 %  11/15/12 1500 96/48 mmHg - - 70  21  100 %  11/15/12 1430 102/59 mmHg - - 74  23  100 %  11/15/12 1400 123/66 mmHg - - 84  21  100 %  11/15/12 1330 126/67 mmHg - - 85  18  100 %  11/15/12 1300 110/62 mmHg - - 73  27  100 %  11/15/12 1230 102/59 mmHg - - 67  23  100 %  11/15/12 1201 98/55 mmHg - - 69  24  100 %  11/15/12 1200 98/55 mmHg - - 67  23  100 %  11/15/12 1155 - 97.4 F (36.3 C) Oral - - -  11/15/12 1130 94/49 mmHg - - 65  14  100 %  11/15/12 1100 107/57 mmHg - - 70  24  100 %  11/15/12 1030 113/76 mmHg - - 82  17  100 %  11/15/12 1000 88/53 mmHg - - 65  21  100 %  11/15/12 0930 110/57 mmHg - - 71  8  100 %  11/15/12 0910 134/76 mmHg - - 83  28  100 %  11/15/12 0900 134/76 mmHg - - 89  15  100 %  11/15/12 0832 - 97.5 F (36.4 C) Oral - - -  11/15/12 0830 118/67 mmHg - - 76  13  100 %  11/15/12 0828 - - - 78  17  100 %  11/15/12 0819 103/61 mmHg - - 74  22  100 %  11/15/12 0800 103/61 mmHg 97.5 F (36.4 C) Oral 69  22  100 %  11/15/12 0700 111/60 mmHg - - 77  21  100 %    Physical Exam: General:  No acute distress, awake Cardiovascular:    [x]   S1/S2 present, RRR, distant sounds  []   Irregularly irregular Chest:  Intubated, vent sounds Abdomen:               []  Soft, appropriately TTP  [x]  Soft, NTTP  []  Soft, appropriately TTP, incision(s) clean/dry/intact  Genitourinary: Foley in place, sediment in urine.     I/O last 3 completed shifts: In: 3649.5 [I.V.:1237.5; Blood:235; NG/GT:2067; IV Piggyback:110] Out: [id number] [Urine:43; Other:7537]  Recent Labs  [facility] 11/16/12 0242 11/15/12 0445   HGB 7.0* 7.4*   WBC 18.5* 21.2*   PLT 112* 107*    Recent Labs  [facility] 11/16/12 0242 11/15/12 1543   NA 135 137   K 4.0 4.1   CL 101 101   CO2 27 27   BUN 34* 33*    CREATININE 2.07* 2.25*   CALCIUM 8.0* 7.9*   GFRNONAA 27* 24*   GFRAA 31* 28*     Recent Labs  Basename 11/16/12 0242 11/15/12 0825   INR 1.23 1.32   APTT 41* 38*     No components found with this basename: ABG:2    Length of stay: 6 days.  Assessment: Urosepsis. Right UPJ stone.    Plan: To OR for cystoscopy, left ureter stent removal, possible bilateral retrograde pyelogram, right ureter stent placement.   Rolan Bucco, MD [phone]

## 2012-11-16 NOTE — Progress Notes (Signed)
Attempted to call report to [place] staff to give report. Was told that the nurse anesthesiologist taking the patient was not present yet and would not be available until after 0600.

## 2012-11-16 NOTE — Progress Notes (Signed)
pacu nursing:  [facility] here to pick up patient.  Dr. Jasmine December at bedside speaking with patient

## 2012-11-17 ENCOUNTER — Inpatient Hospital Stay (HOSPITAL_COMMUNITY): Payer: BC Managed Care – PPO

## 2012-11-17 DIAGNOSIS — N200 Calculus of kidney: Secondary | ICD-10-CM

## 2012-11-17 DIAGNOSIS — L732 Hidradenitis suppurativa: Secondary | ICD-10-CM

## 2012-11-17 LAB — RENAL FUNCTION PANEL
Calcium: 8.2 mg/dL — ABNORMAL LOW (ref 8.4–10.5)
Creatinine, Ser: 2.05 mg/dL — ABNORMAL HIGH (ref 0.50–1.10)
GFR calc Af Amer: 31 mL/min — ABNORMAL LOW (ref >90)
GFR calc non Af Amer: 27 mL/min — ABNORMAL LOW (ref >90)
Glucose, Bld: 116 mg/dL — ABNORMAL HIGH (ref 70–99)
Phosphorus: 2.8 mg/dL (ref 2.3–4.6)
Sodium: 138 mEq/L (ref 135–145)

## 2012-11-17 LAB — BLOOD GAS, ARTERIAL
MECHVT: 540 mL
PEEP: 5 cmH2O
Patient temperature: 99.1
TCO2: 28.4 mmol/L (ref 0–100)
pCO2 arterial: 51.3 mmHg — ABNORMAL HIGH (ref 35.0–45.0)
pH, Arterial: 7.34 — ABNORMAL LOW (ref 7.350–7.450)

## 2012-11-17 LAB — CBC
HCT: 26 % — ABNORMAL LOW (ref 36.0–46.0)
Hemoglobin: 7.7 g/dL — ABNORMAL LOW (ref 12.0–15.0)
MCH: 27.5 pg (ref 26.0–34.0)
MCV: 92.9 fL (ref 78.0–100.0)
RBC: 2.8 MIL/uL — ABNORMAL LOW (ref 3.87–5.11)
WBC: 21.8 10*3/uL — ABNORMAL HIGH (ref 4.0–10.5)

## 2012-11-17 LAB — MAGNESIUM: Magnesium: 2.3 mg/dL (ref 1.5–2.5)

## 2012-11-17 LAB — POCT ACTIVATED CLOTTING TIME
Activated Clotting Time: 192 seconds
Activated Clotting Time: 192 seconds

## 2012-11-17 LAB — APTT: aPTT: 43 seconds — ABNORMAL HIGH (ref 24–37)

## 2012-11-17 LAB — GLUCOSE, CAPILLARY
Glucose-Capillary: 96 mg/dL (ref 70–99)
Glucose-Capillary: 98 mg/dL (ref 70–99)

## 2012-11-17 MED ORDER — METOCLOPRAMIDE HCL 5 MG/ML IJ SOLN
10.0000 mg | Freq: Four times a day (QID) | INTRAMUSCULAR | Status: DC | PRN
  Administered 2012-11-17 (×2): 10 mg via INTRAVENOUS
  Filled 2012-11-17 (×2): qty 2

## 2012-11-17 MED ORDER — FUROSEMIDE 10 MG/ML IJ SOLN
160.0000 mg | Freq: Four times a day (QID) | INTRAVENOUS | Status: AC
  Administered 2012-11-18: 160 mg via INTRAVENOUS
  Filled 2012-11-17 (×2): qty 16

## 2012-11-17 NOTE — Progress Notes (Signed)
Urology Progress Note  Subjective:     No acute urologic events overnight. Patient is intubated. She is very awake and can communicate via reading/writing. No questions about surgery today; her main concern is the ET tube.  Her urine output has picked up well and it has cleared of the sediment nicely.   ROS: Positive: nausea  Objective:  Patient Vitals for the past 24 hrs:  BP Temp Temp src Pulse Resp SpO2 Weight  11/17/12 0700 108/52 mmHg - - 71  29  100 % -  11/17/12 0600 122/72 mmHg - - 81  23  100 % -  11/17/12 0500 120/62 mmHg - - 88  22  98 % -  11/17/12 0459 - - - - - - 162.7 kg (358 lb 11 oz)  11/17/12 0418 157/93 mmHg - - 80  18  - -  11/17/12 0400 172/89 mmHg 99.1 F (37.3 C) Oral 98  22  100 % -  11/17/12 0300 157/93 mmHg - - 99  20  100 % -  11/17/12 0200 130/64 mmHg - - 79  25  100 % -  11/17/12 0149 - - - - - 100 % -  11/17/12 0100 124/70 mmHg - - 80  22  100 % -  11/17/12 0032 - 98.7 F (37.1 C) Oral - - - -  11/17/12 0000 128/63 mmHg - - 80  26  100 % -  11/16/12 2300 121/68 mmHg - - 79  16  100 % -  11/16/12 2200 120/67 mmHg - - 92  15  100 % -  11/16/12 2100 120/59 mmHg - - 75  14  100 % -  11/16/12 2000 133/90 mmHg 99.3 F (37.4 C) Oral 82  16  100 % -  11/16/12 1945 - - - - - 100 % -  11/16/12 1900 123/62 mmHg - - 72  22  100 % -  11/16/12 1800 119/74 mmHg - - 72  18  100 % -  11/16/12 1700 106/50 mmHg - - 75  15  100 % -  11/16/12 1600 118/63 mmHg - - 76  15  100 % -  11/16/12 1526 118/20 mmHg - - 73  13  100 % -  11/16/12 1500 118/20 mmHg - - 75  12  100 % -  11/16/12 1400 98/57 mmHg - - 82  17  100 % -  11/16/12 1300 133/48 mmHg - - 82  27  100 % -  11/16/12 1240 - 99.3 F (37.4 C) Oral - - - -  11/16/12 1200 158/51 mmHg - - 90  0  100 % -  11/16/12 1100 123/58 mmHg - - 100  - 100 % -  11/16/12 1016 - - - 98  7  100 % -  11/16/12 1015 - - - 99  13  100 % -  11/16/12 1014 - - - 100  20  100 % -  11/16/12 1013 - - - 101  22  100 % -  11/16/12  1012 - - - - 19  - -  11/16/12 1010 - - - - 20  - -  11/16/12 1009 - - - - 22  - -  11/16/12 1008 - - - 99  20  100 % -  11/16/12 1007 - - - 100  22  100 % -  11/16/12 1006 - - - 100  19  100 % -  11/16/12 1005 - - - 100  20  100 % -  11/16/12 1004 - - - 102  20  100 % -  11/16/12 1003 - - - 100  19  100 % -  11/16/12 1001 - - - 100  22  100 % -  11/16/12 1000 148/86 mmHg - - 100  19  100 % -  11/16/12 0959 - - - 100  19  100 % -  11/16/12 0958 - - - 100  18  100 % -  11/16/12 0957 - - - 101  18  100 % -  11/16/12 0956 - - - 100  19  100 % -  11/16/12 0955 - - - 100  19  100 % -  11/16/12 0954 - - - 100  18  100 % -  11/16/12 0953 - - - 101  19  100 % -  11/16/12 0952 - - - 101  17  100 % -  11/16/12 0951 - - - 100  17  100 % -  11/16/12 0950 - - - 101  17  100 % -  11/16/12 0949 - - - 101  24  100 % -  11/16/12 0948 - - - 100  26  100 % -  11/16/12 0947 - - - 101  19  100 % -  11/16/12 0946 - - - 100  20  100 % -  11/16/12 0945 131/77 mmHg - - 99  19  100 % -  11/16/12 0944 - - - 99  20  100 % -  11/16/12 0943 - - - 99  18  100 % -  11/16/12 0942 - - - 99  20  100 % -  11/16/12 0941 - - - 98  17  100 % -  11/16/12 0940 - - - 99  21  100 % -  11/16/12 0939 - - - 99  16  100 % -  11/16/12 0938 - - - 99  19  100 % -  11/16/12 0937 - - - 97  18  100 % -  11/16/12 0936 - - - 97  18  100 % -  11/16/12 0935 - - - 98  17  100 % -  11/16/12 0934 - - - 98  17  100 % -  11/16/12 0933 - - - 98  19  100 % -  11/16/12 0932 - - - 98  17  100 % -  11/16/12 0931 - - - 97  20  100 % -  11/16/12 0930 149/86 mmHg 98.5 F (36.9 C) - 97  18  100 % -  11/16/12 0929 - - - 97  18  100 % -  11/16/12 0928 - - - 97  18  100 % -  11/16/12 0927 - - - 97  18  100 % -  11/16/12 0926 - - - 98  18  98 % -  11/16/12 0925 - - - 98  19  96 % -  11/16/12 0924 - - - 99  19  98 % -  11/16/12 0923 - - - 100  19  100 % -  11/16/12 0922 - - - 101  19  100 % -  11/16/12 0921 - - - 99  21  100 % -  11/16/12  0920 - - - 98  17  100 % -  11/16/12 0919 - - - 98  16  100 % -  11/16/12 0918 - - -  99  19  100 % -  11/16/12 0917 - - - 99  17  100 % -  11/16/12 0916 - - - 99  12  100 % -  11/16/12 0915 157/86 mmHg - - 100  17  100 % -  11/16/12 0914 - - - 99  18  100 % -  11/16/12 0913 - - - 101  19  100 % -  11/16/12 0912 - - - 97  18  100 % -  11/16/12 0911 - - - 95  19  100 % -  11/16/12 0910 161/86 mmHg - - 98  20  100 % -  11/16/12 0909 - - - 106  22  100 % -  11/16/12 0908 - - - 96  20  100 % -  11/16/12 0907 - - - 99  21  100 % -  11/16/12 0906 - - - 102  - 97 % -  11/16/12 0904 157/88 mmHg - - - - - -  11/16/12 0903 - - - 100  16  100 % -    Physical Exam: General:  No acute distress, awake Cardiovascular:    [x]   S1/S2 present, RRR, distant sounds  []   Irregularly irregular Chest:  Intubated, vent sounds Abdomen:               []  Soft, appropriately TTP  [x]  Soft, NTTP  []  Soft, appropriately TTP, incision(s) clean/dry/intact  Genitourinary: Foley in place, sediment in urine.     I/O last 3 completed shifts: In: 2398.7 [I.V.:1653.7; NG/GT:745] Out: [id number] [Urine:777; Emesis/NG output:150; Other:4290]  Recent Labs  [facility] 11/17/12 0455 11/16/12 0242   HGB 7.7* 7.0*   WBC 21.8* 18.5*   PLT 156 112*    Recent Labs  Basename 11/17/12 0455 11/16/12 1600   NA 138 136   K 4.1 3.9   CL 102 101   CO2 27 27   BUN 32* 35*   CREATININE 2.05* 2.31*   CALCIUM 8.2* 8.1*   GFRNONAA 27* 23*   GFRAA 31* 27*     Recent Labs  [facility] 11/17/12 0455 11/16/12 0242 11/15/12 0825   INR -- 1.23 1.32   APTT 43* 41* --     No components found with this basename: ABG:2    Length of stay: 7 days.  Assessment: Urosepsis. Right UPJ stone.    Plan: Right ureter stent placement, left ureter stent removal, bilateral retrograde pyelograms 11/16/12. No further urologic intervention at this time. Dr. Matilde Sprang will resume urologic care tomorrow.   Rolan Bucco, MD [phone]

## 2012-11-17 NOTE — Progress Notes (Signed)
Subjective: remains intubated  Objective Vital signs in last 24 hours: Filed Vitals:   11/17/12 0815 11/17/12 0900 11/17/12 0939 11/17/12 1000  BP: 103/50 115/59  129/64  Pulse: 77 84  76  Temp:      TempSrc:      Resp: 21 22  23   Height:      Weight:      SpO2: 100% 100% 100% 100%   Weight change: 2.2 kg (4 lb 13.6 oz)  Intake/Output Summary (Last 24 hours) at 11/17/12 1118 Last data filed at 11/17/12 1100  Gross per 24 hour  Intake   1320 ml  Output   3988 ml  Net  -2668 ml   UOP 30-40/hour  Physical Exam:  Blood pressure 129/64, pulse 76, temperature 98.9 F (37.2 C), temperature source Oral, resp. rate 23, height 5' 6.14" (1.68 m), weight 162.7 kg (358 lb 11 oz), SpO2 100.00%. WEIGHTS:  11/17/12 0459 ! 162.7 kg (358) 11/16/12 160.5 kg (353 lb)  11/15/12 0200 ! 165.1 kg (363 lb)  11/14/12 0130 ! 169.8 kg (374 lb)  11/14/12 0100 ! 166.4 kg (366 lb)  11/13/12 0500 ! 167.9 kg (370 lb)  11/12/12 0400 ! 171.6 kg (378 lb)  11/11/12 0500 ! 171.8 kg (378 lb)  11/10/12 2215 ! 171.8 kg (378) Intubated  Coarse rhoncous bs anteriorly  Regular S1S2 No S3  Still with abdominal wall edema  Hug pannus Much improved edema still 1+ le's, thighs Maceration groins  Right fem cath in place  Urine in foley much clearer about 100 cc in bag  Labs: Basic Metabolic Panel:  Lab [id number] 0455 11/16/12 1600 11/16/12 0242 11/15/12 1543 11/15/12 0445 11/14/12 1515 11/14/12 0424  NA 138 136 135 137 137 134* 136  K 4.1 3.9 4.0 4.1 4.1 4.0 4.1  CL 102 101 101 101 102 100 102  CO2 27 27 27 27 28 26 26   GLUCOSE 116* 107* 116* 140* 118* 165* 137*  BUN 32* 35* 34* 33* 35* 35* 38*  CREATININE 2.05* 2.31* 2.07* 2.25* 2.59* 2.87* 3.21*  ALB -- -- -- -- -- -- --  CALCIUM 8.2* 8.1* 8.0* 7.9* 7.9* 7.9* 7.7*  PHOS 2.8 2.7 2.5 2.3 2.7 2.5 2.5   Liver Function Tests:  Lab 11/17/12 0455 11/16/12 1600 11/16/12 0242 11/13/12 0500 11/12/12 0330 11/11/12 0500  AST -- -- -- 40* 40* 14  ALT -- -- --  8 7 <5  ALKPHOS -- -- -- 160* 141* 164*  BILITOT -- -- -- 0.6 0.6 1.2  PROT -- -- -- 7.5 8.2 8.3  ALBUMIN 2.1* 2.1* 2.1* -- -- --  CBC:  Lab 11/17/12 0455 11/16/12 0242 11/15/12 0445 11/14/12 2359 11/14/12 1515 11/13/12 0500  WBC 21.8* 18.5* 21.2* 21.2* -- --  NEUTROABS -- 16.3* 18.9* -- 19.5* 25.5*  HGB 7.7* 7.0* 7.4* 7.5* -- --  HCT 26.0* 23.5* 24.0* 24.4* -- --  MCV 92.9 92.2 90.6 90.0 -- --  PLT 156 112* 107* 110* -- --    CBG:  Lab 11/17/12 0741 11/17/12 0406 11/17/12 0016 11/16/12 2005 11/16/12 1536  GLUCAP 98 96 117* 132* 97    Iron Studies:  Lab 11/11/12 1310  IRON 22*  TIBC 201*  TRANSFERRIN --  FERRITIN --   Studies/Results: Dg Chest Port 1 View  11/17/2012  *RADIOLOGY REPORT*  Clinical Data: Intubated  PORTABLE CHEST - 1 VIEW  Comparison: 11/16/2012  Findings: The study is limited by patient rotation.  Cardiomegaly again noted.  Stable endotracheal tube  position.  Stable left IJ central line position. There is asymmetric haziness in the left lung.  Although this may be  positional asymmetric edema or pneumonia cannot be excluded. Follow-up examination is recommended.  Stable NG tube position.  IMPRESSION: Cardiomegaly again noted.  Stable endotracheal tube position. Stable left IJ central line position. There is asymmetric haziness in the left lung.  Although this may be  positional asymmetric edema or pneumonia cannot be excluded. Follow-up examination is recommended.  Stable NG tube position.   Original Report Authenticated By: Lahoma Crocker, M.D.    Dg Chest Port 1 View  11/16/2012  *RADIOLOGY REPORT*  Clinical Data: Evaluate endotracheal tube position  PORTABLE CHEST - 1 VIEW  Comparison: Portable chest x-ray of 11/15/2012  Findings: The tip of the endotracheal is approximately 3.9 cm above the carina.  There is little change in diffuse airspace disease although aeration may have improved slightly.  Cardiomegaly is stable.  Left central venous line tip overlies the mid  SVC.  IMPRESSION:  1.  Tip of endotracheal tube 3.9 cm above the carina. 2.  Minimally improved aeration.  Persistent cardiomegaly with little change in diffuse airspace disease.   Original Report Authenticated By: Ivar Drape, M.D.    Dg C-arm 1-60 Min-no Report  11/16/2012  CLINICAL DATA: bilateral retrograde pyelogram   C-ARM 1-60 MINUTES  Fluoroscopy was utilized by the requesting physician.  No radiographic  interpretation.     Medications:    . fentaNYL infusion INTRAVENOUS 150 mcg/hr (11/17/12 0334)  . heparin 10,000 units/ 20 mL infusion syringe 1,850 Units/hr (11/17/12 1000)  . dialysis replacement fluid (prismasate) 300 mL/hr at 11/17/12 0734  . dialysis replacement fluid (prismasate) 500 mL/hr at 11/16/12 2346  . dialysate (PRISMASATE) 2,000 mL/hr at 11/17/12 0903      . albuterol  8 puff Inhalation Q6H  . antiseptic oral rinse  15 mL Mouth Rinse QID  . cefTRIAXone (ROCEPHIN)  IV  2 g Intravenous Q24H  . chlorhexidine  15 mL Mouth Rinse BID  . dextrose  1 ampule Intravenous Once  . feeding supplement (OXEPA)  1,000 mL Per Tube Q24H  . feeding supplement  60 mL Per Tube QID  . ferumoxytol  510 mg Intravenous Q3 days  . insulin aspart  2-6 Units Subcutaneous Q4H  . ipratropium  8 puff Inhalation Q6H  . levothyroxine  25 mcg Per Tube QAC breakfast  . pantoprazole sodium  40 mg Per Tube QHS  . paricalcitol  2 mcg Intravenous Q M,W,F  . sodium chloride  25 mL/kg Intravenous Once    I  have reviewed scheduled and prn medications.  ASSESSMENT/RECOMMENDATIONS   51 yo BF with MO, history of hidradenitis suppurativa, DMII, hypothroidism, prior h/o left ureteral obstruction with stones (required jj stent 05/2012), anemia, recurrent iron deficiency, systolic CHF with LVEF [id number], chronic antibiotics for skin issues, and several recurrent episodes of AKI (baseline creatinine 2.47 in Oct 2013). She presented 11/10/12 with hypercarbic respiratory failure, sepsis syndrome, LLL PNA,  E.Coli bacteremia and EColi in the urine, acute renal failure, severe hyperkalemia and severe metabolic acidosis requiring initiation of CRRT  1. AKI on CKD (creatinine 2.47 08/2012)   CRRT since 11/11/12 via right fem HD cath (12/16)   Secondary to sepsis syndrome and hypoperfusion (EColi bacteremia/shock) plus obstruction right side  Pressors and dobutamine remain off   EColi in blood and urine   Has been on all 4K fluids pulling 75-125/hour  Weight down 20-25 lbs with CRRT still with  some edema but volume status much better (CXR wtill diffuse ASDz)  S/p Cystoscopy, left ureter stent removal, bilateral retrograde pyelogram, right ureter stent placement, fulguration bladder mucosa   Obstruction no longer an issue   Making 30 cc/hour urine after was previously anuric     Would favor holding CRRT to see if urine output will pick up and/or respond to lasix (will give 2 doses of 160)  Spoke with Dr. Alva Garnet who does not feel that holding for 24 hours would impede extubation since not close to that yet    2 Massive obesity  3 Sepsis (Urine) ATB's  4 DM per primary  5 Anemia   IV iron (ordered for 2 doses)   Transfuse prn  6 Sec HPTH   PTH 200 (09/05/12) 662 now   Started zemplar TIW (12/18)  7 Hydradenitis suppurativa  8. Stones/obstructin  JJ stent  S/p Cystoscopy, left ureter stent removal, bilateral retrograde pyelogram, right ureter stent placement, fulguration bladder mucosa for obstructive process right side (12/21)    Jamal Maes, MD [facility] [phone] pager 11/17/2012, 11:18 AM

## 2012-11-17 NOTE — CV Procedure (Addendum)
Dr Kieth Brightly notified of multiple vomiting episodes. Orders received. Will continue to assess.  Also discussed with Dr. Earnest Conroy the patient 7 beat run of VT at 0213am. Pt asymptomatic. Strip printed and placed in chart.

## 2012-11-17 NOTE — Progress Notes (Signed)
Name: [name] MRN: [id number] DOB: 23-Apr-1961    LOS: 7  PULMONARY / CRITICAL CARE MEDICINE  HPI:   51 years old morbid obese female with complex PMH relevant for systolic CHF with LVEF of 35 to 40%, HTN, DM2, hypothyroidism, chronic pain on narcotics and CKD being evaluated by nephrology for dialysis. She also has a complex history of suppurative hidradenitis and is on chronic antibiotic therapy. In icu for PNA, septic shock, ARF.   Abx: 12/15 vanc>>>12/15 12/15 zosyn>>>12/17 12/16 levofloxacin>>>12/17 12/16 ceftriaxone>>>  Cultures: 12/15 BC x 2>>>E coli sens ceftr 12/15 urine>>>E coli (variable res) 12/15 tracheal >>>NF  Lines: 12/15 left IJ >>> 12/15  Rt rad>>>12/19 12/15 ETT>>> 12/16 rt fem HD >>>  Events/ Studeis: 12/15 >>> Arrest 12/15 renal US>>Suboptimal examination. There may be mild pelvicaliectasis on the<BR>right.<BR> <BR>Left kidney not visualized 12/16>>>ARF, shock, started on cvvhd 12/16 echo - 55% EF, mod reduced rv fxn, PA 46 12/17- pressors better, K resolved  12/17 CT chest- bibasilr small infiltrates / atx 12/17 CT abdo/pelvis>>>Left ureteral stent placement following removal of left  ureteral calculi. No residual left-sided hydronephrosis or ureteral stone fragments. Apparent lithotripsy of the right renal calculi with obstructing 10 mm stone fragment at the right ureteral pelvic junction. There are small renal calyceal stone fragments bilaterally. Anasarca 12/18- off pressors, weaning, awake 12/18- urology called, remains off pressors, failed weaning 12/20- neg balance 12/21 >tranx WL for uro proced>Cystoscopy, left ureter stent removal, bilateral retrograde pyelogram, right ureter stent placement  Interval [name] :  Alert, responsive  Improved UOP  vomitted overnight, rx reglan  Weaning on PS this am   Vital Signs: Temp:  [98.5 F (36.9 C)-99.3 F (37.4 C)] 98.9 F (37.2 C) (12/22 0756) Pulse Rate:  [71-102] 77  (12/22 0815) Resp:   [0-29] 21  (12/22 0815) BP: (98-172)/(20-93) 103/50 mmHg (12/22 0815) SpO2:  [96 %-100 %] 100 % (12/22 0815) FiO2 (%):  [0.4 %-40.5 %] 40 % (12/22 0815) Weight:  [162.7 kg (358 lb 11 oz)] 162.7 kg (358 lb 11 oz) (12/22 0459)  Physical Examination: General:  Morbid obese, Intubated, mechanically ventilated Neuro:   follows commands HEENT: ETT  Neck:  Supple, no JVD   Cardiovascular:  RRR, no M/R/G, 1+edema  Lungs:  diminshed throughout Abdomen:  Soft, nontender, nondistended, bowel sounds present improved, obese massive Musculoskeletal:  Moves all extremities Skin: hidradenitis / chronic lichenified wounds  Principal Problem:  *Sepsis   ASSESSMENT AND PLAN  PULMONARY  Lab 11/17/12 0445 11/14/12 0429 11/13/12 0443 11/12/12 1037 11/12/12 0505  PHART 7.340* 7.412 7.404 7.456* 7.405  PCO2ART 51.3* 42.4 41.6 34.9* 37.1  PO2ART 129.0* 174.0* 152.0* 130.0* 115.0*  HCO3 26.8* 27.0* 25.4* 24.8* 22.8  O2SAT 99.1 100.0 99.6 99.0 99.1   Ventilator Settings: Vent Mode:  [-] PSV;CPAP FiO2 (%):  [0.4 %-40.5 %] 40 % Set Rate:  [16 bmp] 16 bmp Vt Set:  [540 mL] 540 mL PEEP:  [4.9 cmH20-5 cmH20] 5 cmH20 Pressure Support:  [16 cmH20] 16 cmH20 Plateau Pressure:  [18 cmH20-26 cmH20] 24 cmH20 CXR: Bilateral infiltrates interstial unchanged, big heart-no sign change   A:   1) Acute hypoxemic and hypercarbic respiratory failure 2) Bilateral pneumonia basilar vs atx 3) r/o component ARDS  P:   --may have component ards, refelcted on pcxr -continue neg balance -wean as tolerated   CARDIOVASCULAR  Lab 11/11/12 0010 11/10/12 2055 11/10/12 1927 11/10/12 1724  TROPONINI -- -- -- <0.30  LATICACIDVEN 11.3* 2.4* 2.53* --  PROBNP -- -- --  [id number]*   A:  1) Sepsis secondary to bilateral pneumonia / urosepsis 2) Systolic CHF  P:  -MAP goals met -continue neg balance as BP has tolerated  RENAL  Lab 11/17/12 0455 11/16/12 1600 11/16/12 0242 11/15/12 1543 11/15/12 0445 11/14/12 0424  11/13/12 0500  NA 138 136 135 137 137 -- --  K 4.1 3.9 -- -- -- -- --  CL 102 101 101 101 102 -- --  CO2 27 27 27 27 28  -- --  BUN 32* 35* 34* 33* 35* -- --  CREATININE 2.05* 2.31* 2.07* 2.25* 2.59* -- --  CALCIUM 8.2* 8.1* 8.0* 7.9* 7.9* -- --  MG 2.3 -- 2.3 -- 2.3 2.2 2.3  PHOS 2.8 2.7 2.5 2.3 2.7 -- --   Intake/Output      12/21 0701 - 12/22 0700 12/22 0701 - 12/23 0700   I.V. (mL/kg) 1303.7 (8) 35 (0.2)   NG/GT 500 0   IV Piggyback     Total Intake(mL/kg) 1803.7 (11.1) 35 (0.2)   Urine (mL/kg/hr) 767 (0.2)    Emesis/NG output 150    Other 2743 157   Total Output 3660 157   Net -1856.3 -122        Stool Occurrence 1 x    Emesis Occurrence 3 x     Foley:  11/10/12  A:   1) Acute on chronic renal failure 2) Hyperkalemia resolved >s/p right ureter stent 12/21 P:   -foley remains needed -improved UOP    GASTROINTESTINAL  Lab 11/17/12 0455 11/16/12 1600 11/16/12 0242 11/15/12 1543 11/15/12 0445 11/13/12 0500 11/12/12 0330 11/11/12 0500 11/11/12 0010  AST -- -- -- -- -- 40* 40* 14 8  ALT -- -- -- -- -- 8 7 <5 <5  ALKPHOS -- -- -- -- -- 160* 141* 164* 174*  BILITOT -- -- -- -- -- 0.6 0.6 1.2 0.5  PROT -- -- -- -- -- 7.5 8.2 8.3 7.7  ALBUMIN 2.1* 2.1* 2.1* 2.1* 2.1* -- -- -- --   11/17/2012 vomitted overnight, improved w/ TF on hold and reglan rx  A:   1)  obese P:   -protonix Oxepa, to goal>on hold  -  Cont reglan short term   HEMATOLOGIC  Lab 11/17/12 0455 11/16/12 0242 11/15/12 0825 11/15/12 0445 11/14/12 2359 11/14/12 1515 11/14/12 0424 11/13/12 0500 11/12/12 0930 11/11/12 0010  HGB 7.7* 7.0* -- 7.4* 7.5* 7.3* -- -- -- --  HCT 26.0* 23.5* -- 24.0* 24.4* 22.8* -- -- -- --  PLT 156 112* -- 107* 110* 105* -- -- -- --  INR -- 1.23 1.32 -- -- -- -- -- 1.69* 1.78*  APTT 43* 41* 38* -- -- -- 32 35 -- --   A:   1) Anemia likely due to chronic disease and sepsis / dilution P:   -CT no bleeding, no bleeding clinically,   -off hep >SCD    INFECTIOUS  Lab  11/17/12 0455 11/16/12 0242 11/15/12 0445 11/14/12 2359 11/14/12 1515 11/10/12 2055  WBC 21.8* 18.5* 21.2* 21.2* 21.5* --  PROCALCITON -- -- -- -- -- 81.37    A:   1) Septic shock-E coli, urine source in setting of stent - Bilateral pneumonia bases mild - Suppurative hidradenitis P:   -continue ceftriaxone, goal 14 days treatment, may need extended, pending urology findings  ENDOCRINE  Lab 11/17/12 0741 11/17/12 0406 11/17/12 0016 11/16/12 2005 11/16/12 1536  GLUCAP 98 96 117* 132* 97   A:   1) DM 2 controlled 2 rel  AI r/o 3hypothyroidism known P:   - ICU hyperglycemia protocol with SQ Novolog  Controlled - synthroid per tube   NEUROLOGIC  A:   1) Altered mental status likely due to sepsis and hypercarbia 2) Intubated, sedated P:   Cont to monitor   BEST PRACTICE / DISPOSITION - Level of Care:  ICU - Primary Service:  PCCM - Consultants:  Nephrology, urology (scott [facility]) - Code Status:  Full code - Diet:  Tf 12/17>>> - DVT Px:  SCD  - GI Px:  Protonix IV - Skin Integrity:  Severe suppurative hidradenitis  - Social / Family: updated     Discussed with Dr Lorrene Reid 35 mins CCM time  Merton Border, MD ; [facility] service Mobile [phone].  After 5:30 PM or weekends, call [phone]

## 2012-11-18 ENCOUNTER — Inpatient Hospital Stay (HOSPITAL_COMMUNITY): Payer: BC Managed Care – PPO

## 2012-11-18 ENCOUNTER — Encounter (HOSPITAL_COMMUNITY): Payer: Self-pay | Admitting: Urology

## 2012-11-18 DIAGNOSIS — J9601 Acute respiratory failure with hypoxia: Secondary | ICD-10-CM | POA: Diagnosis present

## 2012-11-18 LAB — BLOOD GAS, ARTERIAL
Acid-Base Excess: 2.6 mmol/L — ABNORMAL HIGH (ref 0.0–2.0)
Drawn by: 34779
FIO2: 0.4 %
MECHVT: 540 mL
O2 Saturation: 99.6 %
PEEP: 5 cmH2O
Patient temperature: 100.7
RATE: 16 resp/min

## 2012-11-18 LAB — RENAL FUNCTION PANEL
BUN: 36 mg/dL — ABNORMAL HIGH (ref 6–23)
Chloride: 102 mEq/L (ref 96–112)
Creatinine, Ser: 2.65 mg/dL — ABNORMAL HIGH (ref 0.50–1.10)
Glucose, Bld: 93 mg/dL (ref 70–99)
Potassium: 3.9 mEq/L (ref 3.5–5.1)

## 2012-11-18 LAB — GLUCOSE, CAPILLARY
Glucose-Capillary: 88 mg/dL (ref 70–99)
Glucose-Capillary: 119 mg/dL — ABNORMAL HIGH (ref 70–99)

## 2012-11-18 LAB — CBC
HCT: 23.7 % — ABNORMAL LOW (ref 36.0–46.0)
MCH: 27.8 pg (ref 26.0–34.0)
MCHC: 30 g/dL (ref 30.0–36.0)
MCV: 92.9 fL (ref 78.0–100.0)
RDW: 17.9 % — ABNORMAL HIGH (ref 11.5–15.5)
WBC: 12.9 10*3/uL — ABNORMAL HIGH (ref 4.0–10.5)

## 2012-11-18 MED ORDER — DARBEPOETIN ALFA-POLYSORBATE 200 MCG/0.4ML IJ SOLN
200.0000 ug | INTRAMUSCULAR | Status: DC
  Filled 2012-11-18: qty 0.4

## 2012-11-18 MED ORDER — CYCLOBENZAPRINE HCL 10 MG PO TABS
10.0000 mg | ORAL_TABLET | Freq: Three times a day (TID) | ORAL | Status: DC | PRN
  Administered 2012-11-18 – 2012-11-26 (×3): 10 mg via ORAL
  Filled 2012-11-18 (×3): qty 1

## 2012-11-18 MED ORDER — OXEPA PO LIQD
1000.0000 mL | ORAL | Status: DC
  Administered 2012-11-18 – 2012-11-19 (×2): 1000 mL
  Filled 2012-11-18 (×5): qty 1000

## 2012-11-18 MED ORDER — PRO-STAT SUGAR FREE PO LIQD
60.0000 mL | Freq: Four times a day (QID) | ORAL | Status: DC
  Administered 2012-11-18 – 2012-11-20 (×4): 60 mL
  Filled 2012-11-18 (×15): qty 60

## 2012-11-18 MED ORDER — DULOXETINE HCL 60 MG PO CPEP
60.0000 mg | ORAL_CAPSULE | Freq: Every day | ORAL | Status: DC
  Administered 2012-11-18 – 2012-12-03 (×13): 60 mg via ORAL
  Filled 2012-11-18 (×16): qty 1

## 2012-11-18 NOTE — Progress Notes (Signed)
Subjective: Interval History: has complaints wants tube out..  Objective: Vital signs in last 24 hours: Temp:  [98.9 F (37.2 C)-100.7 F (38.2 C)] 98.9 F (37.2 C) (12/23 0429) Pulse Rate:  [76-94] 85  (12/23 0600) Resp:  [0-31] 27  (12/23 0400) BP: (101-136)/(43-80) 122/51 mmHg (12/23 0600) SpO2:  [100 %] 100 % (12/23 0600) FiO2 (%):  [39.7 %-40.5 %] 39.8 % (12/23 0600) Weight:  [162.7 kg (358 lb 11 oz)] 162.7 kg (358 lb 11 oz) (12/23 0446) Weight change: 0 kg (0 lb)  Intake/Output from previous day: 12/22 0701 - 12/23 0700 In: 638.5 [I.V.:512.5; NG/GT:60; IV Piggyback:66] Out: [id number] [Urine:1000; Emesis/NG output:250] Intake/Output this shift:    General appearance: cooperative, delirious and morbidly obese Resp: diminished breath sounds bilaterally and rhonchi bilaterally Cardio: S1, S2 normal, systolic murmur: holosystolic 2/6, blowing at apex and diminished hs GI: obese, pos bs, liver down 7 cm Pelvic: not done Skin: Skin color, texture, turgor normal. No rashes or lesions or erosions under breasts , on abdm, abscesses on groin  Lab Results:  [facility] 11/18/12 0458 11/17/12 0455  WBC 12.9* 21.8*  HGB 7.1* 7.7*  HCT 23.7* 26.0*  PLT 194 156   BMET:  Basename 11/18/12 0458 11/17/12 0455  NA 138 138  K 3.9 4.1  CL 102 102  CO2 27 27  GLUCOSE 93 116*  BUN 36* 32*  CREATININE 2.65* 2.05*  CALCIUM 8.5 8.2*   No results found for this basename: PTH:2 in the last 72 hours Iron Studies: No results found for this basename: IRON,TIBC,TRANSFERRIN,FERRITIN in the last 72 hours  Studies/Results: Dg Chest Port 1 View  11/17/2012  *RADIOLOGY REPORT*  Clinical Data: Intubated  PORTABLE CHEST - 1 VIEW  Comparison: 11/16/2012  Findings: The study is limited by patient rotation.  Cardiomegaly again noted.  Stable endotracheal tube position.  Stable left IJ central line position. There is asymmetric haziness in the left lung.  Although this may be  positional asymmetric edema  or pneumonia cannot be excluded. Follow-up examination is recommended.  Stable NG tube position.  IMPRESSION: Cardiomegaly again noted.  Stable endotracheal tube position. Stable left IJ central line position. There is asymmetric haziness in the left lung.  Although this may be  positional asymmetric edema or pneumonia cannot be excluded. Follow-up examination is recommended.  Stable NG tube position.   Original Report Authenticated By: Lahoma Crocker, M.D.    Dg C-arm 1-60 Min-no Report  11/16/2012  CLINICAL DATA: bilateral retrograde pyelogram   C-ARM 1-60 MINUTES  Fluoroscopy was utilized by the requesting physician.  No radiographic  interpretation.      I have reviewed the patient's current medications. Prior to Admission:  Prescriptions prior to admission  Medication Sig Dispense Refill  . aspirin EC 81 MG EC tablet Take 1 tablet (81 mg total) by mouth daily.  30 tablet  3  . carvedilol (COREG) 6.25 MG tablet Take 6.25 mg by mouth 2 (two) times daily with a meal.      . cyclobenzaprine (FLEXERIL) 10 MG tablet Take 10 mg by mouth 3 (three) times daily as needed. For Spasms      . DULoxetine (CYMBALTA) 60 MG capsule Take 60 mg by mouth daily.        . ferrous sulfate 325 (65 FE) MG tablet Take 325 mg by mouth 2 (two) times daily.        . folic acid (FOLVITE) 1 MG tablet Take 1 mg by mouth daily.      Marland Kitchen  furosemide (LASIX) 80 MG tablet Take 240 mg by mouth 2 (two) times daily.      . isosorbide mononitrate (IMDUR) 30 MG 24 hr tablet Take 30 mg by mouth daily.      Marland Kitchen levothyroxine (SYNTHROID, LEVOTHROID) 25 MCG tablet Take 25 mcg by mouth every morning. Take on an empty stomach      . magnesium oxide (MAG-OX) 400 MG tablet Take 400 mg by mouth 2 (two) times daily.       Marland Kitchen oxyCODONE-acetaminophen (PERCOCET) 5-325 MG per tablet Take 1 tablet by mouth every 6 (six) hours as needed. For pain      . pravastatin (PRAVACHOL) 40 MG tablet Take 40 mg by mouth daily.        Marland Kitchen saccharomyces boulardii  (FLORASTOR) 250 MG capsule Take 1 capsule (250 mg total) by mouth 2 (two) times daily.  30 capsule  0  . sevelamer (RENVELA) 800 MG tablet Take 3 tablets (2,400 mg total) by mouth 3 (three) times daily with meals.  180 tablet  0  . sodium bicarbonate 650 MG tablet Take 2 tablets (1,300 mg total) by mouth 3 (three) times daily.  120 tablet  0  . sodium hypochlorite (DAKIN'S 1/4 STRENGTH) 0.125 % SOLN Irrigate with 1 application as directed daily as needed. Apply to wound with dressing changes      . vitamin C (ASCORBIC ACID) 500 MG tablet Take 500 mg by mouth daily.      Marland Kitchen zinc sulfate 220 MG capsule Take 220 mg by mouth daily.        Assessment/Plan: 1AKI now making urine,off CRRT. Vol xs and needs neg balance. Acid base/K ok. Mild Resp acidosis.  Will follow chem and urine 2 Resp failure ? 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 Extub   LOS: 8 days   [name] 11/18/2012,7:06 AM

## 2012-11-18 NOTE — Progress Notes (Signed)
NUTRITION FOLLOW UP  Intervention:    Restart Oxepa at 15 ml/h, increase by 10 ml every 4 hours to goal rate of 25 ml/h with Prostat 60 ml QID to provide 1700 kcals (65% estimated needs), 158 gm protein (100% estimated needs), 471 ml free water daily  Nutrition Dx:   Inadequate oral intake related to inability to eat as evidenced by NPO status, ongoing.  Goal:   Enteral nutrition to provide 60-70% of estimated calorie needs (22-25 kcals/kg ideal body weight) and >/= 90% of estimated protein needs, based on ASPEN guidelines for permissive underfeeding in critically ill obese individuals, unmet with TF on hold.  Monitor:   TF tolerance/adequacy, weight trend, labs, I/O, vent status  Assessment:   Patient is currently intubated on ventilator support.  MV: 10.8 Temp:Temp (24hrs), Avg:100 F (37.8 C), Min:98.9 F (37.2 C), Max:101 F (38.3 C)  Discussed patient in rounds today.  TF has been on hold since 3 AM on 12/22 due to nausea.  RD to resume TF today.  CRRT is off.  Hopeful to not need anymore HD.  Height: Ht Readings from Last 1 Encounters:  11/10/12 5' 6.14" (1.68 m)    Weight Status:   Wt Readings from Last 1 Encounters:  11/18/12 358 lb 11 oz (162.7 kg)    Re-estimated needs:  Kcal: 2600 Protein: > 140 gm Fluid: 2.4-2.6 L  Skin: incision on perineum  Diet Order: NPO   Intake/Output Summary (Last 24 hours) at 11/18/12 1216 Last data filed at 11/18/12 1000  Gross per 24 hour  Intake  578.5 ml  Output   1575 ml  Net -996.5 ml    Last BM: 12/22   Labs:   Lab 11/18/12 0458 11/17/12 0455 11/16/12 1600 11/16/12 0242 11/15/12 0445  NA 138 138 136 -- --  K 3.9 4.1 3.9 -- --  CL 102 102 101 -- --  CO2 27 27 27  -- --  BUN 36* 32* 35* -- --  CREATININE 2.65* 2.05* 2.31* -- --  CALCIUM 8.5 8.2* 8.1* -- --  MG -- 2.3 -- 2.3 2.3  PHOS 3.3 2.8 2.7 -- --  GLUCOSE 93 116* 107* -- --    CBG (last 3)   Basename 11/18/12 0813 11/18/12 0412 11/18/12 0014   GLUCAP 88 86 98    Scheduled Meds:   . albuterol  8 puff Inhalation Q6H  . antiseptic oral rinse  15 mL Mouth Rinse QID  . cefTRIAXone (ROCEPHIN)  IV  2 g Intravenous Q24H  . chlorhexidine  15 mL Mouth Rinse BID  . darbepoetin (ARANESP) injection - NON-DIALYSIS  200 mcg Subcutaneous Q Mon-1800  . dextrose  1 ampule Intravenous Once  . feeding supplement (OXEPA)  1,000 mL Per Tube Q24H  . feeding supplement  60 mL Per Tube QID  . insulin aspart  2-6 Units Subcutaneous Q4H  . ipratropium  8 puff Inhalation Q6H  . levothyroxine  25 mcg Per Tube QAC breakfast  . pantoprazole sodium  40 mg Per Tube QHS  . paricalcitol  2 mcg Intravenous Q M,W,F  . sodium chloride  25 mL/kg Intravenous Once    Continuous Infusions:   . fentaNYL infusion INTRAVENOUS 150 mcg/hr (11/18/12 0025)    Molli Barrows, RD, LDN, CNSC Pager# [phone] After Hours Pager# [phone]

## 2012-11-18 NOTE — Progress Notes (Signed)
Patient ID: [name], female   DOB: Jun 11, 1961, 51 y.o.   MRN: [id number] Urine vol cont at over 50 cc/h.   HOH suspect Vanco vs hypotension since has not received high doses of Furosemide that are associated with hearing/vestib dysfunction.

## 2012-11-18 NOTE — Progress Notes (Signed)
Name: [name] MRN: [id number] DOB: [birth year]    LOS: 44  PULMONARY / CRITICAL CARE MEDICINE  HPI:   51 years old morbid obese female with complex PMH relevant for systolic CHF with LVEF of 35 to 40%, HTN, DM2, hypothyroidism, chronic pain on narcotics and CKD being evaluated by nephrology for dialysis. She also has a complex history of suppurative hidradenitis and is on chronic antibiotic therapy. In icu for PNA, septic shock, ARF.   Abx: 12/15 vanc>>>12/15 12/15 zosyn>>>12/17 12/16 levofloxacin>>>12/17 12/16 ceftriaxone>>>  Cultures: 12/15 BC x 2>>>E coli sens ceftr 12/15 urine>>>E coli (variable res) 12/15 tracheal >>>NF  Lines: 12/15 left IJ >>> 12/15  Rt rad>>>12/19 12/15 ETT>>> 12/16 rt fem HD >>>  Events/ Studeis: 12/15 >>> Arrest 12/15 renal US>>Suboptimal examination. There may be mild pelvicaliectasis on the<BR>right.<BR> <BR>Left kidney not visualized 12/16>>>ARF, shock, started on cvvhd 12/16 echo - 55% EF, mod reduced rv fxn, PA 46 12/17- pressors better, K resolved  12/17 CT chest- bibasilr small infiltrates / atx 12/17 CT abdo/pelvis>>>Left ureteral stent placement following removal of left  ureteral calculi. No residual left-sided hydronephrosis or ureteral stone fragments. Apparent lithotripsy of the right renal calculi with obstructing 10 mm stone fragment at the right ureteral pelvic junction. There are small renal calyceal stone fragments bilaterally. Anasarca 12/18- off pressors, weaning, awake 12/18- urology called, remains off pressors, failed weaning 12/20- neg balance 12/21 >tranx WL for uro proced>Cystoscopy, left ureter stent removal, bilateral retrograde pyelogram, right ureter stent placement  Interval [name] :  Alert, responsive  Improved UOP  vomitted overnight, rx reglan  Weaning on PS this am   Vital Signs: Temp:  [98.9 F (37.2 C)-101 F (38.3 C)] 101 F (38.3 C) (12/23 0814) Pulse Rate:  [76-94] 93  (12/23 0815) Resp:   [0-34] 34  (12/23 0815) BP: (101-136)/(43-91) 120/91 mmHg (12/23 0815) SpO2:  [100 %] 100 % (12/23 0815) FiO2 (%):  [39.7 %-40.3 %] 40 % (12/23 0815) Weight:  [162.7 kg (358 lb 11 oz)] 162.7 kg (358 lb 11 oz) (12/23 0446)  Physical Examination: General:  Morbid obese, Intubated, mechanically ventilated Neuro:   follows commands, agitated on WUA, hard of hearing ? New finding HEENT: ETT  Neck:  Supple, no JVD   Cardiovascular:  RRR, no M/R/G, 1+edema  Lungs:  diminshed throughout, tachypneic Abdomen:  Soft, nontender, nondistended, bowel sounds present improved, obese massive Musculoskeletal:  Moves all extremities Skin: hidradenitis / chronic lichenified wounds  Principal Problem:  *Sepsis   ASSESSMENT AND PLAN  PULMONARY  Lab 11/18/12 0415 11/17/12 0445 11/14/12 0429 11/13/12 0443 11/12/12 1037  PHART 7.393 7.340* 7.412 7.404 7.456*  PCO2ART 45.6* 51.3* 42.4 41.6 34.9*  PO2ART 150.0* 129.0* 174.0* 152.0* 130.0*  HCO3 26.8* 26.8* 27.0* 25.4* 24.8*  O2SAT 99.6 99.1 100.0 99.6 99.0   Ventilator Settings: Vent Mode:  [-] CPAP;PSV FiO2 (%):  [39.7 %-40.3 %] 40 % Set Rate:  [16 bmp] 16 bmp Vt Set:  [540 mL] 540 mL PEEP:  [5 cmH20] 5 cmH20 Pressure Support:  [8 cmH20-10 cmH20] 8 cmH20 Plateau Pressure:  [20 cmH20-21 cmH20] 20 cmH20 CXR:lt hazy infiltrates ? Edema vs effusion, big heart-no sign change   A:   1) Acute hypoxemic and hypercarbic respiratory failure 2) Bilateral pneumonia basilar vs atx 3) r/o component ARDS  P:   -continue neg balance -wean as tolerated - close to extubation but remains tachypenic - unclear if this is anxiety driven vs real WOB  CARDIOVASCULAR No results found for this basename:  TROPONINI:5,LATICACIDVEN:5, O2SATVEN:5,PROBNP:5 in the last 168 hours A:  1) Sepsis secondary to bilateral pneumonia / urosepsis 2) Systolic CHF  P:  -MAP goals met -continue neg balance as BP has tolerated  RENAL  Lab 11/18/12 0458 11/17/12 0455 11/16/12  1600 11/16/12 0242 11/15/12 1543 11/15/12 0445 11/14/12 0424 11/13/12 0500  NA 138 138 136 135 137 -- -- --  K 3.9 4.1 -- -- -- -- -- --  CL 102 102 101 101 101 -- -- --  CO2 27 27 27 27 27  -- -- --  BUN 36* 32* 35* 34* 33* -- -- --  CREATININE 2.65* 2.05* 2.31* 2.07* 2.25* -- -- --  CALCIUM 8.5 8.2* 8.1* 8.0* 7.9* -- -- --  MG -- 2.3 -- 2.3 -- 2.3 2.2 2.3  PHOS 3.3 2.8 2.7 2.5 2.3 -- -- --   Intake/Output      12/22 0701 - 12/23 0700 12/23 0701 - 12/24 0700   I.V. (mL/kg) 512.5 (3.2)    NG/GT 60    IV Piggyback 66    Total Intake(mL/kg) 638.5 (3.9)    Urine (mL/kg/hr) 1000 (0.3)    Emesis/NG output 250    Other 521    Total Output 1771    Net -1132.5          Foley:  11/10/12  A:   1) Acute on chronic renal failure 2) Hyperkalemia resolved >s/p right ureter stent 12/21 P:   -foley remains needed -improved UOP- 1L last 24h     GASTROINTESTINAL  Lab 11/18/12 0458 11/17/12 0455 11/16/12 1600 11/16/12 0242 11/15/12 1543 11/13/12 0500 11/12/12 0330  AST -- -- -- -- -- 40* 40*  ALT -- -- -- -- -- 8 7  ALKPHOS -- -- -- -- -- 160* 141*  BILITOT -- -- -- -- -- 0.6 0.6  PROT -- -- -- -- -- 7.5 8.2  ALBUMIN 2.0* 2.1* 2.1* 2.1* 2.1* -- --   11/18/2012 vomitted overnight, improved w/ TF on hold and reglan rx  A:   1)  obese P:   -protonix Oxepa, to goal>on hold , restart if not extubated -  Cont reglan short term   HEMATOLOGIC  Lab 11/18/12 0458 11/17/12 0455 11/16/12 0242 11/15/12 0825 11/15/12 0445 11/14/12 2359 11/14/12 0424 11/13/12 0500 11/12/12 0930  HGB 7.1* 7.7* 7.0* -- 7.4* 7.5* -- -- --  HCT 23.7* 26.0* 23.5* -- 24.0* 24.4* -- -- --  PLT 194 156 112* -- 107* 110* -- -- --  INR -- -- 1.23 1.32 -- -- -- -- 1.69*  APTT -- 43* 41* 38* -- -- 32 35 --   A:   1) Anemia likely due to chronic disease and sepsis / dilution P:   -CT no bleeding, no bleeding clinically,   -off hep >SCD  -transfuse for <7  INFECTIOUS  Lab 11/18/12 0458 11/17/12 0455 11/16/12  0242 11/15/12 0445 11/14/12 2359  WBC 12.9* 21.8* 18.5* 21.2* 21.2*  PROCALCITON -- -- -- -- --    A:   1) Septic shock-E coli, urosepsis s/p Left ureter stent removal ,Right ureter stent placement on 12/21 - Bilateral pneumonia bases mild - Suppurative hidradenitis -fever 12/23 with decrease in WC P:   -continue ceftriaxone, goal 14 days treatment - dc femoral HD cath soon if fever persists  ENDOCRINE  Lab 11/18/12 0813 11/18/12 0412 11/18/12 0014 11/17/12 2000 11/17/12 1542  GLUCAP 88 86 98 95 97   A:   1) DM 2 controlled 2 rel AI r/o 3hypothyroidism  known P:   - ICU hyperglycemia protocol with SQ Novolog  Controlled - synthroid per tube   NEUROLOGIC  A:   1) Altered mental status likely due to sepsis and hypercarbia 2) deafness -? New ? Due to high dose lasix P:   Cont to monitor  Avoid high dose lasix here  BEST PRACTICE / DISPOSITION - Level of Care:  ICU - Primary Service:  PCCM - Consultants:  Nephrology, urology (scott mcdermit) - Code Status:  Full code - Diet:  Tf 12/17>>> - DVT Px:  SCD  - GI Px:  Protonix IV - Skin Integrity:  Severe suppurative hidradenitis  - Social / Family: updated     Care during the described time interval was provided by me and/or other providers on the critical care team.  I have reviewed this patient's available data, including medical history, events of note, physical examination and test results as part of my evaluation  CC time x  40 m  Kara Mead MD. Shade Flood. [facility] Pager [phone] If no response call [phone]

## 2012-11-19 ENCOUNTER — Encounter (HOSPITAL_COMMUNITY): Payer: Self-pay

## 2012-11-19 ENCOUNTER — Inpatient Hospital Stay (HOSPITAL_COMMUNITY): Payer: BC Managed Care – PPO

## 2012-11-19 DIAGNOSIS — J9600 Acute respiratory failure, unspecified whether with hypoxia or hypercapnia: Secondary | ICD-10-CM

## 2012-11-19 DIAGNOSIS — J9819 Other pulmonary collapse: Secondary | ICD-10-CM

## 2012-11-19 DIAGNOSIS — J9811 Atelectasis: Secondary | ICD-10-CM | POA: Diagnosis present

## 2012-11-19 LAB — CBC
HCT: 24.2 % — ABNORMAL LOW (ref 36.0–46.0)
Hemoglobin: 7.3 g/dL — ABNORMAL LOW (ref 12.0–15.0)
RBC: 2.58 MIL/uL — ABNORMAL LOW (ref 3.87–5.11)
WBC: 11.9 10*3/uL — ABNORMAL HIGH (ref 4.0–10.5)

## 2012-11-19 LAB — COMPREHENSIVE METABOLIC PANEL
ALT: <5 U/L (ref 0–35)
Alkaline Phosphatase: 113 U/L (ref 39–117)
BUN: 46 mg/dL — ABNORMAL HIGH (ref 6–23)
CO2: 28 mEq/L (ref 19–32)
Chloride: 104 mEq/L (ref 96–112)
GFR calc Af Amer: 19 mL/min — ABNORMAL LOW (ref >90)
GFR calc non Af Amer: 16 mL/min — ABNORMAL LOW (ref >90)
Glucose, Bld: 129 mg/dL — ABNORMAL HIGH (ref 70–99)
Potassium: 3.6 mEq/L (ref 3.5–5.1)
Sodium: 139 mEq/L (ref 135–145)
Total Bilirubin: 0.3 mg/dL (ref 0.3–1.2)
Total Protein: 7.2 g/dL (ref 6.0–8.3)

## 2012-11-19 LAB — RENAL FUNCTION PANEL
CO2: 29 mEq/L (ref 19–32)
Calcium: 8.5 mg/dL (ref 8.4–10.5)
Chloride: 105 mEq/L (ref 96–112)
GFR calc Af Amer: 19 mL/min — ABNORMAL LOW (ref >90)
GFR calc non Af Amer: 16 mL/min — ABNORMAL LOW (ref >90)
Glucose, Bld: 131 mg/dL — ABNORMAL HIGH (ref 70–99)
Sodium: 141 mEq/L (ref 135–145)

## 2012-11-19 LAB — PHOSPHORUS: Phosphorus: 4.3 mg/dL (ref 2.3–4.6)

## 2012-11-19 LAB — GLUCOSE, CAPILLARY: Glucose-Capillary: 99 mg/dL (ref 70–99)

## 2012-11-19 NOTE — Progress Notes (Signed)
11/19/12 1100  Clinical Encounter Type  Visited With Patient and family together  Visit Type Initial;Spiritual support     11/19/12 1100  Clinical Encounter Type  Visited With Patient and family together  Visit Type Initial;Spiritual support   Provided emotional and spiritual support for patient and spouse. Prayed with the family. Joesph Fillers

## 2012-11-19 NOTE — Progress Notes (Signed)
Name: Siarra Ellinger MRN: [id number] DOB: [birth year]    LOS: 28  PULMONARY / CRITICAL CARE MEDICINE  HPI:   51 years old morbid obese female with complex PMH relevant for systolic CHF with LVEF of 35 to 40%, HTN, DM2, hypothyroidism, chronic pain on narcotics and CKD being evaluated by nephrology for dialysis. She also has a complex history of suppurative hidradenitis and is on chronic antibiotic therapy. In icu for PNA, septic shock, ARF.   Abx: 12/15 vanc>>>12/15 12/15 zosyn>>>12/17 12/16 levofloxacin>>>12/17 12/16 ceftriaxone>>>12/26 (planned)  Cultures: 12/15 BC x 2>>>E coli sens ceftr 12/15 urine>>>E coli (variable res) 12/15 tracheal >>>NF  Lines: 12/15 left IJ >>> 12/15  Rt rad>>>12/19 12/15 ETT>>> 12/16 rt fem HD >>>  Events/ Studeis: 12/15 >>> Arrest 12/15 renal US>>Suboptimal examination. There may be mild pelvicaliectasis on the<BR>right.<BR> <BR>Left kidney not visualized 12/16>>>ARF, shock, started on cvvhd 12/16 echo - 55% EF, mod reduced rv fxn, PA 46 12/17- pressors better, K resolved  12/17 CT chest- bibasilr small infiltrates / atx 12/17 CT abdo/pelvis>>>Left ureteral stent placement following removal of left  ureteral calculi. No residual left-sided hydronephrosis or ureteral stone fragments. Apparent lithotripsy of the right renal calculi with obstructing 10 mm stone fragment at the right ureteral pelvic junction. There are small renal calyceal stone fragments bilaterally. Anasarca 12/18- off pressors, weaning, awake 12/18- urology called, remains off pressors, failed weaning 12/20- neg balance 12/21 >tranx WL for uro proced>Cystoscopy, left ureter stent removal, bilateral retrograde pyelogram, right ureter stent placement  Interval /Overnight :  afebrile Sleepy this am, responsive , hard of hearing Improved UOP  Weaning on PS this am   Vital Signs: Temp:  [98.6 F (37 C)-100.2 F (37.9 C)] 98.6 F (37 C) (12/24 0330) Pulse Rate:  [79-116] 87   (12/24 0740) Resp:  [0-33] 25  (12/24 0740) BP: (94-163)/(51-96) 94/51 mmHg (12/24 0740) SpO2:  [98 %-100 %] 100 % (12/24 0740) FiO2 (%):  [39.6 %-40.2 %] 39.9 % (12/24 0740) Weight:  [150.6 kg (332 lb 0.2 oz)] 150.6 kg (332 lb 0.2 oz) (12/24 0700)  Physical Examination: General:  Morbid obese, Intubated, mechanically ventilated Neuro:   follows commands, agitated on WUA, hard of hearing  HEENT: ETT  Neck:  Supple, no JVD   Cardiovascular:  RRR, no M/R/G, 1+edema  Lungs:  diminshed throughout, tachypneic Abdomen:  Soft, nontender, nondistended, bowel sounds present improved, obese massive Musculoskeletal:  Moves all extremities Skin: hidradenitis / chronic lichenified wounds  Principal Problem:  *Sepsis Active Problems:  Acute respiratory failure with hypoxia   ASSESSMENT AND PLAN  PULMONARY  Lab 11/18/12 0415 11/17/12 0445 11/14/12 0429 11/13/12 0443 11/12/12 1037  PHART 7.393 7.340* 7.412 7.404 7.456*  PCO2ART 45.6* 51.3* 42.4 41.6 34.9*  PO2ART 150.0* 129.0* 174.0* 152.0* 130.0*  HCO3 26.8* 26.8* 27.0* 25.4* 24.8*  O2SAT 99.6 99.1 100.0 99.6 99.0   Ventilator Settings: Vent Mode:  [-] PSV FiO2 (%):  [39.6 %-40.2 %] 39.9 % Set Rate:  [16 bmp] 16 bmp Vt Set:  [540 mL] 540 mL PEEP:  [5 cmH20] 5 cmH20 Pressure Support:  [10 cmH20] 10 cmH20 Plateau Pressure:  [21 cmH20-24 cmH20] 23 cmH20 CXR:lt hazy infiltrates ? Edema vs effusion, big heart-no sign change   A:   1) Acute hypoxemic and hypercarbic respiratory failure 2) RUL pneumonia vs atx 3) r/o component ARDS  P:   -continue neg balance -wean as tolerated - close to extubation but remains tachypenic - Appears to be anxiety driven vs real WOB _bronchoscopy today  CARDIOVASCULAR No results found for this basename: TROPONINI:5,LATICACIDVEN:5, O2SATVEN:5,PROBNP:5 in the last 168 hours A:  1) Sepsis secondary to bilateral pneumonia / urosepsis 2) Systolic CHF  P:  -continue neg balance as BP has  tolerated  RENAL  Lab 11/19/12 0435 11/18/12 0458 11/17/12 0455 11/16/12 1600 11/16/12 0242 11/15/12 0445 11/14/12 0424 11/13/12 0500  NA 141139 138 138 136 135 -- -- --  K 3.63.6 3.9 -- -- -- -- -- --  CL 105104 102 102 101 101 -- -- --  CO2 2928 27 27 27 27  -- -- --  BUN 46*46* 36* 32* 35* 34* -- -- --  CREATININE 3.17*3.11* 2.65* 2.05* 2.31* 2.07* -- -- --  CALCIUM 8.58.5 8.5 8.2* 8.1* 8.0* -- -- --  MG -- -- 2.3 -- 2.3 2.3 2.2 2.3  PHOS 4.34.3 3.3 2.8 2.7 2.5 -- -- --   Intake/Output      12/23 0701 - 12/24 0700 12/24 0701 - 12/25 0700   I.V. (mL/kg) 651.8 (4.3)    NG/GT 715    IV Piggyback     Total Intake(mL/kg) 1366.8 (9.1)    Urine (mL/kg/hr) 1680 (0.5)    Emesis/NG output     Other     Stool 3    Total Output 1683    Net -316.2          Foley:  11/10/12  A:   1) Acute on chronic renal failure 2) Hyperkalemia resolved >s/p right ureter stent 12/21 P:   -foley remains needed -improved UOP- 1.7 L last 24h     GASTROINTESTINAL  Lab 11/19/12 0435 11/18/12 0458 11/17/12 0455 11/16/12 1600 11/16/12 0242 11/13/12 0500  AST 13 -- -- -- -- 40*  ALT <5 -- -- -- -- 8  ALKPHOS 113 -- -- -- -- 160*  BILITOT 0.3 -- -- -- -- 0.6  PROT 7.2 -- -- -- -- 7.5  ALBUMIN 2.1*2.1* 2.0* 2.1* 2.1* 2.1* --   11/19/2012 vomitted overnight, improved w/ TF on hold and reglan rx  A:   1)  obese P:   -protonix Oxepa, to goal> hold  For procedure then restart -  Cont reglan short term   HEMATOLOGIC  Lab 11/19/12 0435 11/18/12 0458 11/17/12 0455 11/16/12 0242 11/15/12 0825 11/15/12 0445 11/14/12 0424 11/13/12 0500 11/12/12 0930  HGB 7.3* 7.1* 7.7* 7.0* -- 7.4* -- -- --  HCT 24.2* 23.7* 26.0* 23.5* -- 24.0* -- -- --  PLT 227 194 156 112* -- 107* -- -- --  INR -- -- -- 1.23 1.32 -- -- -- 1.69*  APTT -- -- 43* 41* 38* -- 32 35 --   A:   1) Anemia likely due to chronic disease and sepsis / dilution P:   -CT no bleeding, no bleeding clinically,   -off hep >SCD   -transfuse for <7  INFECTIOUS  Lab 11/19/12 0435 11/18/12 0458 11/17/12 0455 11/16/12 0242 11/15/12 0445  WBC 11.9* 12.9* 21.8* 18.5* 21.2*  PROCALCITON -- -- -- -- --    A:   1) Septic shock-E coli, urosepsis s/p Left ureter stent removal ,Right ureter stent placement on 12/21 - Bilateral pneumonia bases mild - Suppurative hidradenitis -fever 12/23 with decrease in WC P:   -continue ceftriaxone, goal 14 days treatment - dc femoral HD cath soon if not required/ rpt fever  ENDOCRINE  Lab 11/19/12 0329 11/19/12 0022 11/18/12 2006 11/18/12 1652 11/18/12 1147  GLUCAP 123* 123* 122* 119* 97   A:   1) DM 2 controlled 2  rel AI r/o 3hypothyroidism known P:   - ICU hyperglycemia protocol with SQ Novolog  Controlled - synthroid per tube   NEUROLOGIC  A:   1) Altered mental status likely due to sepsis and hypercarbia 2) deafness -? New - Lasix not a cause per renal , only received 1-2 doses of vanc P:   Cont to monitor    BEST PRACTICE / DISPOSITION - Level of Care:  ICU - Primary Service:  PCCM - Consultants:  Nephrology, urology (scott mcdermit) - Code Status:  Full code - Diet:  Tf 12/17>>> - DVT Px:  SCD  - GI Px:  Protonix IV - Skin Integrity:  Severe suppurative hidradenitis  - Social / Family: updated     Care during the described time interval was provided by me and/or other providers on the critical care team.  I have reviewed this patient's available data, including medical history, events of note, physical examination and test results as part of my evaluation  CC time x  40 m  Kara Mead MD. Shade Flood. [facility] Pager [phone] If no response call [phone]

## 2012-11-19 NOTE — Progress Notes (Signed)
Subjective: Interval History: has complaints HOH.  Objective: Vital signs in last 24 hours: Temp:  [98.6 F (37 C)-100.2 F (37.9 C)] 98.6 F (37 C) (12/24 0330) Pulse Rate:  [79-116] 87  (12/24 0740) Resp:  [0-33] 25  (12/24 0740) BP: (94-163)/(51-96) 94/51 mmHg (12/24 0740) SpO2:  [98 %-100 %] 100 % (12/24 0740) FiO2 (%):  [39.6 %-40.2 %] 39.9 % (12/24 0740) Weight:  [150.6 kg (332 lb 0.2 oz)] 150.6 kg (332 lb 0.2 oz) (12/24 0700) Weight change: -12.1 kg (-26 lb 10.8 oz)  Intake/Output from previous day: 12/23 0701 - 12/24 0700 In: 1366.8 [I.V.:651.8; NG/GT:715] Out: 1683 [Urine:1680; Stool:3] Intake/Output this shift:    General appearance: cooperative and morbidly obese Resp: diminished breath sounds bilaterally, rales bilaterally and rhonchi bilaterally Cardio: S1, S2 normal GI: pos bs, massive obese, liver down 5 cm Extremities: edema 1+, R fem cath and  Skin: ulcers under breast, on abdm and groin. peau d orange skin abdm  Lab Results:  Basename 11/19/12 0435 11/18/12 0458  WBC 11.9* 12.9*  HGB 7.3* 7.1*  HCT 24.2* 23.7*  PLT 227 194   BMET:  Basename 11/19/12 0435 11/18/12 0458  NA 141139 138  K 3.63.6 3.9  CL 105104 102  CO2 2928 27  GLUCOSE 131*129* 93  BUN 46*46* 36*  CREATININE 3.17*3.11* 2.65*  CALCIUM 8.58.5 8.5   No results found for this basename: PTH:2 in the last 72 hours Iron Studies: No results found for this basename: IRON,TIBC,TRANSFERRIN,FERRITIN in the last 72 hours  Studies/Results: Dg Chest Port 1 View  11/19/2012  *RADIOLOGY REPORT*  Clinical Data: Evaluate endotracheal tube position.  PORTABLE CHEST - 1 VIEW  Comparison: Chest x-ray 11/18/2012.  Findings: An endotracheal tube is in place with tip 7.8 cm above the carina. There is a left-sided internal jugular central venous catheter with tip terminating in the distal superior vena cava. A nasogastric tube is seen extending into the stomach, however, the tip of the nasogastric  tube extends below the lower margin of the image.  No opacity in the upper right hemithorax with elevation of the minor fissure, compatible with a combination of atelectasis and consolidation in the right upper lobe.  Cephalization of the pulmonary vasculature with indistinctness of interstitial markings, suggesting a background of mild interstitial pulmonary edema.  No definite pleural effusions.  Moderate cardiomegaly is unchanged. The patient is rotated to the left on today's exam, resulting in distortion of the mediastinal contours and reduced diagnostic sensitivity and specificity for mediastinal pathology.  IMPRESSION: 1.  Support apparatus, as above. 2.  Interval development of a combination of atelectasis and consolidation in the right upper lobe. 3.  Moderate cardiomegaly with a background of mild interstitial pulmonary edema, suggesting mild congestive heart failure.   Original Report Authenticated By: Vinnie Langton, M.D.    Dg Chest Port 1 View  11/18/2012  *RADIOLOGY REPORT*  Clinical Data: Left effusion versus pneumonia  PORTABLE CHEST - 1 VIEW  Comparison: 11/17/2012  Findings: Endotracheal tube at the T1-2 level.  NG tube has been pulled back and the tip is now just below the carina in the mid esophagus.  The left jugular central venous catheter tip in the SVC is unchanged.  Improvement in vascular congestion.  There remains bibasilar atelectasis or infiltrate.  No significant effusion.  IMPRESSION: NG has been pulled back with the tip in the mid esophagus.  Improvement in vascular congestion.  Bibasilar atelectasis is unchanged.   Original Report Authenticated By: Carl Best, M.D.  I have reviewed the patient's current medications.  Assessment/Plan: 1 AKI/CKD slow ^ Cr, vol stable, some xs.  Acid base/k ok. 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   LOS: 9 days   Islay Polanco L 11/19/2012,8:31 AM

## 2012-11-19 NOTE — Procedures (Signed)
Bronchoscopy Procedure Note [name] [id number] February 16, 1961  Procedure: Bronchoscopy Indications: Diagnostic evaluation of the airways, Obtain specimens for culture and/or other diagnostic studies and Remove secretions RUL atelectasis  Procedure Details Consent: Risks of procedure as well as the alternatives and risks of each were explained to the (patient/caregiver).  Consent for procedure obtained. Time Out: Verified patient identification, verified procedure, site/side was marked, verified correct patient position, special equipment/implants available, medications/allergies/relevent history reviewed, required imaging and test results available.  Performed  In preparation for procedure, patient was given 100% FiO2 and bronchoscope lubricated. Sedation: Benzodiazepines  Airway entered and the following bronchi were examined: Bronchi.   Procedures performed: Brushings performed Bronchoscope removed.  , Patient placed back on 100% FiO2 at conclusion of procedure.    Evaluation Hemodynamic Status: BP stable throughout; O2 sats: stable throughout Patient's Current Condition: stable Specimens:  Sent purulent fluid Complications: No apparent complications Patient did tolerate procedure well.   [name] V. 11/19/2012

## 2012-11-20 LAB — RENAL FUNCTION PANEL
CO2: 29 mEq/L (ref 19–32)
Chloride: 106 mEq/L (ref 96–112)
Creatinine, Ser: 3.49 mg/dL — ABNORMAL HIGH (ref 0.50–1.10)
GFR calc Af Amer: 17 mL/min — ABNORMAL LOW (ref >90)
GFR calc non Af Amer: 14 mL/min — ABNORMAL LOW (ref >90)
Glucose, Bld: 127 mg/dL — ABNORMAL HIGH (ref 70–99)
Sodium: 143 mEq/L (ref 135–145)

## 2012-11-20 LAB — GLUCOSE, CAPILLARY
Glucose-Capillary: 137 mg/dL — ABNORMAL HIGH (ref 70–99)
Glucose-Capillary: 84 mg/dL (ref 70–99)
Glucose-Capillary: 85 mg/dL (ref 70–99)

## 2012-11-20 LAB — CBC
Hemoglobin: 7.4 g/dL — ABNORMAL LOW (ref 12.0–15.0)
MCV: 94.3 fL (ref 78.0–100.0)
Platelets: 248 10*3/uL (ref 150–400)
RBC: 2.64 MIL/uL — ABNORMAL LOW (ref 3.87–5.11)
WBC: 13.4 10*3/uL — ABNORMAL HIGH (ref 4.0–10.5)

## 2012-11-20 MED ORDER — ALBUTEROL SULFATE (5 MG/ML) 0.5% IN NEBU
2.5000 mg | INHALATION_SOLUTION | Freq: Four times a day (QID) | RESPIRATORY_TRACT | Status: DC
  Administered 2012-11-20 – 2012-11-23 (×14): 2.5 mg via RESPIRATORY_TRACT
  Filled 2012-11-20 (×14): qty 0.5

## 2012-11-20 MED ORDER — CINACALCET HCL 30 MG PO TABS
90.0000 mg | ORAL_TABLET | Freq: Every day | ORAL | Status: DC
  Administered 2012-11-22 – 2012-11-26 (×4): 90 mg via ORAL
  Filled 2012-11-20 (×7): qty 3

## 2012-11-20 MED ORDER — LEVOTHYROXINE SODIUM 100 MCG IV SOLR
12.5000 ug | Freq: Every day | INTRAVENOUS | Status: DC
  Administered 2012-11-20 – 2012-11-23 (×4): 12.5 ug via INTRAVENOUS
  Filled 2012-11-20 (×4): qty 0.63

## 2012-11-20 MED ORDER — IPRATROPIUM BROMIDE 0.02 % IN SOLN
0.5000 mg | Freq: Four times a day (QID) | RESPIRATORY_TRACT | Status: DC
  Administered 2012-11-20 – 2012-11-24 (×19): 0.5 mg via RESPIRATORY_TRACT
  Filled 2012-11-20 (×19): qty 2.5

## 2012-11-20 MED ORDER — ALBUTEROL SULFATE HFA 108 (90 BASE) MCG/ACT IN AERS
2.0000 | INHALATION_SPRAY | RESPIRATORY_TRACT | Status: DC | PRN
  Filled 2012-11-20: qty 6.7

## 2012-11-20 NOTE — Progress Notes (Signed)
Name: [name] MRN: [id number] DOB: May 31, 1961    LOS: 51  PULMONARY / CRITICAL CARE MEDICINE  HPI:   51 years old morbid obese female with complex PMH relevant for systolic CHF with LVEF of 35 to 40%, HTN, DM2, hypothyroidism, chronic pain on narcotics and CKD being evaluated by nephrology for dialysis. She also has a complex history of suppurative hidradenitis and is on chronic antibiotic therapy. In icu for PNA, septic shock, ARF.   Abx: 12/15 vanc>>>12/15 12/15 zosyn>>>12/17 12/16 levofloxacin>>>12/17 12/16 ceftriaxone>>>12/26 (planned)  Cultures: 12/15 BC x 2>>>E coli sens ceftr 12/15 urine>>>E coli (variable res) 12/15 tracheal >>>NF  Lines: 12/15 left IJ >>> 12/15  Rt rad>>>12/19 12/15 ETT>>>12/25  12/16 rt fem HD >>>  Events/ Studeis: 12/15 >>> Arrest 12/15 renal US>>Suboptimal examination. There may be mild pelvicaliectasis on the<BR>right.<BR> <BR>Left kidney not visualized 12/16>>>ARF, shock, started on cvvhd 12/16 echo - 55% EF, mod reduced rv fxn, PA 46 12/17- pressors better, K resolved  12/17 CT chest- bibasilr small infiltrates / atx 12/17 CT abdo/pelvis>>>Left ureteral stent placement following removal of left  ureteral calculi. No residual left-sided hydronephrosis or ureteral stone fragments. Apparent lithotripsy of the right renal calculi with obstructing 10 mm stone fragment at the right ureteral pelvic junction. There are small renal calyceal stone fragments bilaterally. Anasarca 12/18- off pressors, weaning, awake 12/18- urology called, remains off pressors, failed weaning 12/20- neg balance 12/21 >tranx WL for uro proced>Cystoscopy, left ureter stent removal, bilateral retrograde pyelogram, right ureter stent placement 12/24 bscopy for RUL atx  Interval [name] :  Self extubated early am Low gr temp Alert this am, responsive , hard of hearing Improved UOP    Vital Signs: Temp:  [98.9 F (37.2 C)-100 F (37.8 C)] 99.5 F (37.5 C)  (12/25 0800) Pulse Rate:  [74-96] 89  (12/25 0800) Resp:  [0-37] 26  (12/25 0800) BP: (95-144)/(18-81) 105/48 mmHg (12/25 0800) SpO2:  [97 %-100 %] 100 % (12/25 0839) FiO2 (%):  [39.8 %-40.6 %] 40 % (12/25 0300)  Physical Examination: General:  Morbid obese, Intubated, mechanically ventilated Neuro:   follows commands, agitated on WUA, hard of hearing  HEENT: ETT  Neck:  Supple, no JVD   Cardiovascular:  RRR, no M/R/G, 1+edema  Lungs:  diminshed throughout, tachypneic Abdomen:  Soft, nontender, nondistended, bowel sounds present improved, obese massive Musculoskeletal:  Moves all extremities Skin: hidradenitis / chronic lichenified wounds  Principal Problem:  *Sepsis Active Problems:  Acute respiratory failure with hypoxia  Atelectasis   ASSESSMENT AND PLAN  PULMONARY  Lab 11/18/12 0415 11/17/12 0445 11/14/12 0429  PHART 7.393 7.340* 7.412  PCO2ART 45.6* 51.3* 42.4  PO2ART 150.0* 129.0* 174.0*  HCO3 26.8* 26.8* 27.0*  O2SAT 99.6 99.1 100.0   Ventilator Settings: Vent Mode:  [-] PRVC FiO2 (%):  [39.8 %-40.6 %] 40 % Set Rate:  [16 bmp] 16 bmp Vt Set:  [540 mL] 540 mL PEEP:  [5 cmH20] 5 cmH20 Plateau Pressure:  [22 cmH20-25 cmH20] 22 cmH20 CXR:lt hazy infiltrates ? Edema vs effusion, big heart-no sign change   A:   1) Acute hypoxemic and hypercarbic respiratory failure 2) RUL pneumonia vs atx - resolved post bscopy 3) r/o component ARDS  P:   -BiPAP qhs & during sleep -chest PT, mobilise   CARDIOVASCULAR No results found for this basename: TROPONINI:5,LATICACIDVEN:5, O2SATVEN:5,PROBNP:5 in the last 168 hours A:  1) Sepsis secondary to bilateral pneumonia / urosepsis 2) diastolic CHF  P:  -continue neg balance   RENAL  Lab  11/20/12 0420 11/19/12 0435 11/18/12 0458 11/17/12 0455 11/16/12 1600 11/16/12 0242 11/15/12 0445 11/14/12 0424  NA 143 141139 138 138 136 -- -- --  K 3.7 3.63.6 -- -- -- -- -- --  CL 106 105104 102 102 101 -- -- --  CO2 29 2928  27 27 27  -- -- --  BUN 56* 46*46* 36* 32* 35* -- -- --  CREATININE 3.49* 3.17*3.11* 2.65* 2.05* 2.31* -- -- --  CALCIUM 8.3* 8.58.5 8.5 8.2* 8.1* -- -- --  MG -- -- -- 2.3 -- 2.3 2.3 2.2  PHOS 4.9* 4.34.3 3.3 2.8 2.7 -- -- --   Intake/Output      12/24 0701 - 12/25 0700 12/25 0701 - 12/26 0700   I.V. (mL/kg) 450 (3)    NG/GT 385    IV Piggyback 50    Total Intake(mL/kg) 885 (5.9)    Urine (mL/kg/hr) 1185 (0.3)    Stool 1    Total Output 1186    Net -301         Stool Occurrence 1 x     Foley:  11/10/12  A:   1) Acute on chronic renal failure 2) Hyperkalemia resolved >s/p right ureter stent 12/21 4) calciphylaxis P:   -foley remains needed -improved UOP ? Need for HD cath , keep since cr rising    GASTROINTESTINAL  Lab 11/20/12 0420 11/19/12 0435 11/18/12 0458 11/17/12 0455 11/16/12 1600  AST -- 13 -- -- --  ALT -- <5 -- -- --  ALKPHOS -- 113 -- -- --  BILITOT -- 0.3 -- -- --  PROT -- 7.2 -- -- --  ALBUMIN 2.1* 2.1*2.1* 2.0* 2.1* 2.1*    A:   1)  obese P:   -protonix -  dc reglan  HEMATOLOGIC  Lab 11/20/12 0420 11/19/12 0435 11/18/12 0458 11/17/12 0455 11/16/12 0242 11/15/12 0825 11/14/12 0424  HGB 7.4* 7.3* 7.1* 7.7* 7.0* -- --  HCT 24.9* 24.2* 23.7* 26.0* 23.5* -- --  PLT 248 227 194 156 112* -- --  INR -- -- -- -- 1.23 1.32 --  APTT -- -- -- 43* 41* 38* 32   A:   1) Anemia likely due to chronic disease and sepsis / dilution P:   -CT no bleeding, no bleeding clinically,   -off hep >SCD  -transfuse for <7  INFECTIOUS  Lab 11/20/12 0420 11/19/12 0435 11/18/12 0458 11/17/12 0455 11/16/12 0242  WBC 13.4* 11.9* 12.9* 21.8* 18.5*  PROCALCITON -- -- -- -- --    A:   1) Septic shock-E coli, urosepsis s/p Left ureter stent removal ,Right ureter stent placement on 12/21 - Bilateral pneumonia bases mild - Suppurative hidradenitis -fever 12/23 with decrease in [place] initially but creeping back up P:   -continue ceftriaxone, goal 14 days  treatment - dc femoral HD cath soon if not required/ rpt fever  ENDOCRINE  Lab 11/20/12 0355 11/20/12 0042 11/19/12 2025 11/19/12 1630 11/19/12 1155  GLUCAP 116* 137* 96 88 98   A:   1) DM 2 controlled 2 rel AI r/o 3hypothyroidism known P:   - ICU hyperglycemia protocol with SQ Novolog  Controlled - synthroid per tube   NEUROLOGIC  A:   1) Altered mental status likely due to sepsis and hypercarbia - resolved 2) deafness -? New - Lasix not a cause per renal , only received 1-2 doses of vanc P:   Cont to monitor    BEST PRACTICE / DISPOSITION - Level of Care:  ICU -  Primary Service:  PCCM - Consultants:  Nephrology, urology (scott mcdermit) - Code Status:  Full code - Diet:  Tf 12/17>>> - DVT Px:  SCD  - GI Px:  Protonix IV - Skin Integrity:  Severe suppurative hidradenitis  - Social / Family: updated     Care during the described time interval was provided by me and/or other providers on the critical care team.  I have reviewed this patient's available data, including medical history, events of note, physical examination and test results as part of my evaluation  CC time x  35 m  Kara Mead MD. Shade Flood. [facility] Pager [phone] If no response call [phone]

## 2012-11-20 NOTE — Progress Notes (Signed)
Subjective: Interval History: has complaints HOH but can hear a little.  Objective: Vital signs in last 24 hours: Temp:  [98.9 F (37.2 C)-100 F (37.8 C)] 98.9 F (37.2 C) (12/25 0356) Pulse Rate:  [74-96] 89  (12/25 0800) Resp:  [0-37] 26  (12/25 0800) BP: (95-144)/(18-81) 105/48 mmHg (12/25 0800) SpO2:  [97 %-100 %] 100 % (12/25 0800) FiO2 (%):  [39.8 %-40.6 %] 40 % (12/25 0300) Weight change:   Intake/Output from previous day: 12/24 0701 - 12/25 0700 In: 885 [I.V.:450; NG/GT:385; IV Piggyback:50] Out: [id number] [Urine:1185; Stool:1] Intake/Output this shift:    General appearance: cooperative, morbidly obese and slowed mentation Back: rhonchi, scattered rales Cardio: systolic murmur: holosystolic 2/6, blowing at apex GI: obese, pos bs, liver down 8 cm Extremities: edema 2+ Skin: draining ulcers groins, superficial ulcers abdm and breasts, peau d orange appearance to abdm  Lab Results:  Basename 11/20/12 0420 11/19/12 0435  WBC 13.4* 11.9*  HGB 7.4* 7.3*  HCT 24.9* 24.2*  PLT 248 227   BMET:  Basename 11/20/12 0420 11/19/12 0435  NA 143 141139  K 3.7 3.63.6  CL 106 105104  CO2 29 2928  GLUCOSE 127* 131*129*  BUN 56* 46*46*  CREATININE 3.49* 3.17*3.11*  CALCIUM 8.3* 8.58.5   No results found for this basename: PTH:2 in the last 72 hours Iron Studies: No results found for this basename: IRON,TIBC,TRANSFERRIN,FERRITIN in the last 72 hours  Studies/Results: Dg Chest Port 1 View  11/19/2012  *RADIOLOGY REPORT*  Clinical Data: Post bronch, intubated  PORTABLE CHEST - 1 VIEW  Comparison: 11/19/2012  Findings: Endotracheal tube 3.1 cm above the carina.  Improvement in the right upper lobe collapse.  Diffuse airspace process persist.  Heart remains enlarged.  Low lung volumes persist.  No enlarging effusion or pneumothorax.  IMPRESSION: Resolving right upper lobe collapse following bronchoscopy. Otherwise stable exam and support apparatus.   Original Report  Authenticated By: Jerilynn Mages. Annamaria Boots, M.D.    Dg Chest Port 1 View  11/19/2012  *RADIOLOGY REPORT*  Clinical Data: Evaluate endotracheal tube position.  PORTABLE CHEST - 1 VIEW  Comparison: Chest x-ray 11/18/2012.  Findings: An endotracheal tube is in place with tip 7.8 cm above the carina. There is a left-sided internal jugular central venous catheter with tip terminating in the distal superior vena cava. A nasogastric tube is seen extending into the stomach, however, the tip of the nasogastric tube extends below the lower margin of the image.  No opacity in the upper right hemithorax with elevation of the minor fissure, compatible with a combination of atelectasis and consolidation in the right upper lobe.  Cephalization of the pulmonary vasculature with indistinctness of interstitial markings, suggesting a background of mild interstitial pulmonary edema.  No definite pleural effusions.  Moderate cardiomegaly is unchanged. The patient is rotated to the left on today's exam, resulting in distortion of the mediastinal contours and reduced diagnostic sensitivity and specificity for mediastinal pathology.  IMPRESSION: 1.  Support apparatus, as above. 2.  Interval development of a combination of atelectasis and consolidation in the right upper lobe. 3.  Moderate cardiomegaly with a background of mild interstitial pulmonary edema, suggesting mild congestive heart failure.   Original Report Authenticated By: Vinnie Langton, M.D.    Dg Chest Port 1 View  11/18/2012  *RADIOLOGY REPORT*  Clinical Data: Left effusion versus pneumonia  PORTABLE CHEST - 1 VIEW  Comparison: 11/17/2012  Findings: Endotracheal tube at the T1-2 level.  NG tube has been pulled back and the tip is  now just below the carina in the mid esophagus.  The left jugular central venous catheter tip in the SVC is unchanged.  Improvement in vascular congestion.  There remains bibasilar atelectasis or infiltrate.  No significant effusion.  IMPRESSION: NG has  been pulled back with the tip in the mid esophagus.  Improvement in vascular congestion.  Bibasilar atelectasis is unchanged.   Original Report Authenticated By: Carl Best, M.D.     I have reviewed the patient's current medications.  Assessment/Plan: 1 AKI/CKD4 a little GFR but minimal, getting by . Mod vol xs, acid/base ok, K ok.  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     LOS: 10 days   [name] 11/20/2012,8:29 AM

## 2012-11-20 NOTE — Progress Notes (Signed)
Subjective:  Consult follow-up:          Reason for Consult: Urosepsis, Nephrolithiasis  Referring Physician: [name] MD  [name] is an 51 y.o. female.  HPI:  1 - Urosepsis + Nephrolithiasis - Pt with long h/o stones. She had episisode of stones 05/2012 + renal failure and CHF treated with left ureteral stent with plan for therapy once medically stable. Unfortunately she never achieved this goal and now represents with urosepsis with e. Coli bacteremia + UTI and multi-organ failure.  Pr presently intubated, on ABX + dialysis, she is weaning pressors and making progress per critical care team.  New CT this admission demonstrates left stent in good position / no hydro, but also Rt sided UPJ stone with some hydro. Dr. Matilde [name] is her primary urologist.    Past Medical History    Diagnosis  Date    .  Systolic congestive heart failure     .  SOB (shortness of breath)     .  HTN (hypertension)     .  DM type 2 (diabetes mellitus, type 2)     .  Chronic kidney disease (CKD), stage III (moderate)     .  Iron deficiency anemia     .  Morbid obesity     .  Hyperkalemia     .  Hypothyroidism     .  CHF (congestive heart failure)          2.  Urology Operative Report   Date of Procedure: 10/2112  Surgeon: Shelley Bucco, MD  Assistant: None  Preoperative Diagnosis: Sepsis. Right obstructing ureter stone.  Postoperative Diagnosis: Same  Procedure(s):  Cystoscopy  Left ureter stent removal  Bilateral retrograde pyelogram  Right ureter stent placement  Fulguration bladder mucosa  Objective: Vital signs in last 24 hours: Temp:  [98.9 F (37.2 C)-100 F (37.8 C)] 98.9 F (37.2 C) (12/25 0356) Pulse Rate:  [74-96] 85  (12/25 0700) Resp:  [0-37] 16  (12/25 0700) BP: (95-144)/(18-81) 96/81 mmHg (12/25 0700) SpO2:  [97 %-100 %] 100 % (12/25 0700) FiO2 (%):  [39.8 %-40.6 %] 40 % (12/25 0300)A  Intake/Output from previous day: 12/24 0701 - 12/25 0700 In: 885 [I.V.:450;  NG/GT:385; IV Piggyback:50] Out: 975 [Urine:975] Intake/Output this shift:    Past Medical History  Diagnosis Date  . Systolic congestive heart failure   . SOB (shortness of breath)   . HTN (hypertension)   . DM type 2 (diabetes mellitus, type 2)   . Chronic kidney disease (CKD), stage III (moderate)   . Iron deficiency anemia   . Morbid obesity   . Hyperkalemia   . Hypothyroidism   . CHF (congestive heart failure)       Physical Exam:  General:  Pt extubated herself last night-under current evaluation for re-intubation.   Lungs - Normal respiratory effort, chest expands symmetrically.  Abdomen - Soft, non-tender & non-distended.  Lab Results:  Basename 11/20/12 0420 11/19/12 0435 11/18/12 0458  WBC 13.4* 11.9* 12.9*  HGB 7.4* 7.3* 7.1*  HCT 24.9* 24.2* 23.7*   BMET  Basename 11/20/12 0420 11/19/12 0435  NA 143 141139  K 3.7 3.63.6  CL 106 105104  CO2 29 2928  GLUCOSE 127* 131*129*  BUN 56* 46*46*  CREATININE 3.49* 3.17*3.11*  CALCIUM 8.3* 8.58.5   No results found for this basename: LABURIN:1 in the last 72 hours Results for orders placed during the hospital encounter of 11/10/12  URINE CULTURE     Status:  Normal   Collection Time   11/10/12  5:51 PM      Component Value Range Status Comment   Specimen Description URINE, CATHETERIZED   Final    Special Requests NONE   Final    Culture  Setup Time 11/10/2012 23:29   Final    Colony Count >=100,000 COLONIES/ML   Final    Culture ESCHERICHIA COLI   Final    Report Status 11/12/2012 FINAL   Final    Organism ID, Bacteria ESCHERICHIA COLI   Final   CULTURE, BLOOD (ROUTINE X 2)     Status: Normal   Collection Time   11/10/12  6:45 PM      Component Value Range Status Comment   Specimen Description BLOOD RIGHT HAND   Final    Special Requests BOTTLES DRAWN AEROBIC AND ANAEROBIC 10CC EA   Final    Culture  Setup Time 11/10/2012 22:45   Final    Culture     Final    Value: ESCHERICHIA COLI     Note:  Two isolates with different morphologies were identified as the same organism.The most resistant organism was reported.     Note: Gram Stain Report Called to,Read Back By and Verified With: [name] @1009  11/11/12 BY KRAWS   Report Status 11/13/2012 FINAL   Final    Organism ID, Bacteria ESCHERICHIA COLI   Final   CULTURE, BLOOD (ROUTINE X 2)     Status: Normal   Collection Time   11/10/12  7:00 PM      Component Value Range Status Comment   Specimen Description BLOOD RIGHT HAND   Final    Special Requests BOTTLES DRAWN AEROBIC AND ANAEROBIC 10CC EA   Final    Culture  Setup Time 11/10/2012 22:45   Final    Culture     Final    Value: ESCHERICHIA COLI     Note: SUSCEPTIBILITIES PERFORMED ON PREVIOUS CULTURE WITHIN THE LAST 5 DAYS.     Note: Gram Stain Report Called to,Read Back By and Verified With: [name] @1009  11/11/12 BY KRAWS   Report Status 11/13/2012 FINAL   Final   MRSA PCR SCREENING     Status: Normal   Collection Time   11/10/12 10:45 PM      Component Value Range Status Comment   MRSA by PCR NEGATIVE  NEGATIVE Final   CULTURE, RESPIRATORY     Status: Normal   Collection Time   11/11/12  4:00 AM      Component Value Range Status Comment   Specimen Description TRACHEAL ASPIRATE   Final    Special Requests NONE   Final    Gram Stain     Final    Value: RARE WBC PRESENT, PREDOMINANTLY PMN     NO SQUAMOUS EPITHELIAL CELLS SEEN     NO ORGANISMS SEEN   Culture Non-Pathogenic Oropharyngeal-type Flora Isolated.   Final    Report Status 11/13/2012 FINAL   Final     Studies/Results:   Assessment/Plan:  E. Coli urosepsis, stone, JJ stent. Renal failure. Self-extubated last night.  Dr. Hilaria [name] to follow.  [name] I 11/20/2012, 7:55 AM

## 2012-11-20 NOTE — Progress Notes (Signed)
While bathing pt. Pt rolled over onto left side and HD cath was laying in the bed. Pt bleeding profusely. Immediate pressure and gauze held onto right groin. Groin stablized and frog leg dressing applied.

## 2012-11-20 NOTE — Progress Notes (Signed)
Patient self-extubated.  Patient placed to a sitting position in bed and given 4L humidified oxygen via [place].  Patient has strong productive cough and maintain oxygen saturations of 95-98%.  RT will monitor.

## 2012-11-20 NOTE — Progress Notes (Signed)
This RN called to patients room due to patient self extubating. RT placed patient on 4l [place]. [facility]ent notified. Patient much more pleasant. No distress. Experiencing strong productive cough. Patient also noted to have removed central line dressing as it was found across the room. Site cleaned and new dressing replaced. Patient with no needs at this time. Will continue to monitor.   Wayland Denis RN

## 2012-11-21 DIAGNOSIS — L732 Hidradenitis suppurativa: Secondary | ICD-10-CM

## 2012-11-21 LAB — COMPREHENSIVE METABOLIC PANEL
ALT: <5 U/L (ref 0–35)
Albumin: 2 g/dL — ABNORMAL LOW (ref 3.5–5.2)
Alkaline Phosphatase: 90 U/L (ref 39–117)
BUN: 58 mg/dL — ABNORMAL HIGH (ref 6–23)
Calcium: 8.2 mg/dL — ABNORMAL LOW (ref 8.4–10.5)
Potassium: 3.8 mEq/L (ref 3.5–5.1)
Sodium: 142 mEq/L (ref 135–145)
Total Protein: 7 g/dL (ref 6.0–8.3)

## 2012-11-21 LAB — CBC
MCHC: 29.3 g/dL — ABNORMAL LOW (ref 30.0–36.0)
Platelets: 263 10*3/uL (ref 150–400)
RDW: 17.7 % — ABNORMAL HIGH (ref 11.5–15.5)
WBC: 10.8 10*3/uL — ABNORMAL HIGH (ref 4.0–10.5)

## 2012-11-21 LAB — GLUCOSE, CAPILLARY
Glucose-Capillary: 83 mg/dL (ref 70–99)
Glucose-Capillary: 75 mg/dL (ref 70–99)
Glucose-Capillary: 82 mg/dL (ref 70–99)

## 2012-11-21 LAB — PREPARE RBC (CROSSMATCH)

## 2012-11-21 MED ORDER — DARBEPOETIN ALFA-POLYSORBATE 200 MCG/0.4ML IJ SOLN
200.0000 ug | INTRAMUSCULAR | Status: DC
  Administered 2012-11-21 – 2012-11-28 (×2): 200 ug via SUBCUTANEOUS
  Filled 2012-11-21 (×2): qty 0.4

## 2012-11-21 MED ORDER — INSULIN ASPART 100 UNIT/ML ~~LOC~~ SOLN
0.0000 [IU] | Freq: Three times a day (TID) | SUBCUTANEOUS | Status: DC
  Administered 2012-11-22 – 2012-12-03 (×12): 3 [IU] via SUBCUTANEOUS

## 2012-11-21 MED ORDER — OXYCODONE-ACETAMINOPHEN 5-325 MG PO TABS
1.0000 | ORAL_TABLET | ORAL | Status: DC | PRN
  Administered 2012-11-21 – 2012-12-03 (×21): 1 via ORAL
  Filled 2012-11-21 (×21): qty 1

## 2012-11-21 MED ORDER — OXYCODONE-ACETAMINOPHEN 5-325 MG/5ML PO SOLN: 5.0000 mL | ORAL | Status: DC | PRN

## 2012-11-21 NOTE — Progress Notes (Signed)
Subjective: Interval History: has complaints fem cath came out yest and had some bleeding. No c/o from patient.  Objective: Vital signs in last 24 hours: Temp:  [97.7 F (36.5 C)-99.5 F (37.5 C)] 98 F (36.7 C) (12/26 0338) Pulse Rate:  [61-89] 75  (12/26 0600) Resp:  [13-35] 24  (12/26 0600) BP: (80-137)/(33-63) 137/61 mmHg (12/26 0600) SpO2:  [97 %-100 %] 100 % (12/26 0600) FiO2 (%):  [39.8 %-40.9 %] 39.8 % (12/26 0000) Weight:  [155.4 kg (342 lb 9.5 oz)] 155.4 kg (342 lb 9.5 oz) (12/26 0400) Weight change:   Intake/Output from previous day: 12/25 0701 - 12/26 0700 In: 370 [P.O.:90; I.V.:230; IV Piggyback:50] Out: 725 [Urine:725] Intake/Output this shift:    General appearance: cooperative, morbidly obese and slowed mentation Resp: diminished breath sounds bilaterally and rhonchi bilaterally Cardio: S1, S2 normal and systolic murmur: holosystolic 2/6, blowing at apex GI: obese, liver down 6 cm, pos bs Skin: draining lesions in groin, ulcers on abdm and under breasts  Lab Results:  [facility] 11/21/12 0445 11/20/12 0420  WBC 10.8* 13.4*  HGB 6.7* 7.4*  HCT 22.9* 24.9*  PLT 263 248   BMET:  Basename 11/21/12 0445 11/20/12 0420  NA 142 143  K 3.8 3.7  CL 105 106  CO2 28 29  GLUCOSE 88 127*  BUN 58* 56*  CREATININE 3.85* 3.49*  CALCIUM 8.2* 8.3*   No results found for this basename: PTH:2 in the last 72 hours Iron Studies: No results found for this basename: IRON,TIBC,TRANSFERRIN,FERRITIN in the last 72 hours  Studies/Results: Dg Chest Port 1 View  11/19/2012  *RADIOLOGY REPORT*  Clinical Data: Post bronch, intubated  PORTABLE CHEST - 1 VIEW  Comparison: 11/19/2012  Findings: Endotracheal tube 3.1 cm above the carina.  Improvement in the right upper lobe collapse.  Diffuse airspace process persist.  Heart remains enlarged.  Low lung volumes persist.  No enlarging effusion or pneumothorax.  IMPRESSION: Resolving right upper lobe collapse following bronchoscopy.  Otherwise stable exam and support apparatus.   Original Report Authenticated By: Jerilynn Mages. Annamaria Boots, M.D.     I have reviewed the patient's current medications.  Assessment/Plan: 1 CKD slowly ^ Cr . Vol xs but stable.  May need support soon 2 Anemia worsened, suspect with bleeding.  Will transfuse 1 unit 3 Pneu on AB seems better 4 E coli sepsis on Rocephin 5 Obstructive Uropathy stent now on R 6 DM stabel 7 Massive obesity. 8 Hydrad supputativa 9 ? Calciphylaxis  P Transfuse, epo, fe, AB     LOS: 11 days   Harman Ferrin L 11/21/2012,7:59 AM

## 2012-11-21 NOTE — Progress Notes (Signed)
Name: [name] MRN: [id number] DOB: 1961-10-15    LOS: 76  PULMONARY / CRITICAL CARE MEDICINE  HPI:   51 years old morbid obese female with complex PMH relevant for systolic CHF with LVEF of 35 to 40%, HTN, DM2, hypothyroidism, chronic pain on narcotics and CKD being evaluated by nephrology for dialysis. She also has a complex history of suppurative hidradenitis and is on chronic antibiotic therapy. In icu for PNA,e coli  septic shock, ARF requiring dialysis  Abx: 12/15 vanc>>>12/15 12/15 zosyn>>>12/17 12/16 levofloxacin>>>12/17 12/16 ceftriaxone>>>12/29 (planned)  Cultures: 12/15 BC x 2>>>E coli sens ceftr 12/15 urine>>>E coli (variable res) 12/15 tracheal >>>NF  Lines: 12/15 left IJ >>> 12/15  Rt rad>>>12/19 12/15 ETT>>>12/25  12/16 rt fem HD >>>12/25  Events/ Studeis: 12/15 >>> Arrest 12/15 renal US>>Suboptimal examination. There may be mild pelvicaliectasis on the<BR>right.<BR> <BR>Left kidney not visualized 12/16>>>ARF, shock, started on cvvhd 12/16 echo - 55% EF, mod reduced rv fxn, PA 46 12/17- pressors better, K resolved  12/17 CT chest- bibasilr small infiltrates / atx 12/17 CT abdo/pelvis>>>Left ureteral stent placement following removal of left  ureteral calculi. No residual left-sided hydronephrosis or ureteral stone fragments. Apparent lithotripsy of the right renal calculi with obstructing 10 mm stone fragment at the right ureteral pelvic junction. There are small renal calyceal stone fragments bilaterally. Anasarca 12/18- off pressors, weaning, awake 12/18- urology called, remains off pressors, failed weaning 12/20- neg balance 12/21 >tranx [facility] for uro proced>Cystoscopy, left ureter stent removal, bilateral retrograde pyelogram, right ureter stent placement 12/24 bscopy for RUL atx  Interval /Overnight :  afebrile Alert this am, hard of hearing  UOP low   Vital Signs: Temp:  [97.7 F (36.5 C)-99.3 F (37.4 C)] 98 F (36.7 C) (12/26 0833) Pulse  Rate:  [61-86] 65  (12/26 0900) Resp:  [13-35] 17  (12/26 0900) BP: (80-137)/(33-63) 104/42 mmHg (12/26 0900) SpO2:  [97 %-100 %] 100 % (12/26 0900) FiO2 (%):  [39.8 %-40.9 %] 39.8 % (12/26 0000) Weight:  [155.4 kg (342 lb 9.5 oz)] 155.4 kg (342 lb 9.5 oz) (12/26 0400)  Physical Examination: General:  Morbid obese Neuro:   follows commands, conversant, hoarse voice-improving, hard of hearing  HEENT: no jvd Neck:  Supple, no JVD   Cardiovascular:  RRR, no M/R/G, 1+edema  Lungs:  diminshed throughout, tachypneic Abdomen:  Soft, nontender, nondistended, bowel sounds present improved, obese massive Musculoskeletal:  Moves all extremities Skin: hidradenitis / chronic lichenified wounds below breast  Principal Problem:  *Sepsis Active Problems:  Acute respiratory failure with hypoxia  Atelectasis   ASSESSMENT AND PLAN  PULMONARY  Lab 11/18/12 0415 11/17/12 0445  PHART 7.393 7.340*  PCO2ART 45.6* 51.3*  PO2ART 150.0* 129.0*  HCO3 26.8* 26.8*  O2SAT 99.6 99.1   Ventilator Settings: Vent Mode:  [-] BIPAP FiO2 (%):  [39.8 %-40.9 %] 39.8 % PEEP:  [5 cmH20] 5 cmH20 Pressure Support:  [10 cmH20] 10 cmH20 CXR:lt hazy infiltrates ? Edema vs effusion, big heart-no sign change   A:   1) Acute hypoxemic and hypercarbic respiratory failure - resolved 2) RUL pneumonia vs atx - resolved post bscopy  P:   -BiPAP qhs & during sleep -chest PT, mobilise   CARDIOVASCULAR No results found for this basename: TROPONINI:5,LATICACIDVEN:5, O2SATVEN:5,PROBNP:5 in the last 168 hours A:  1) Sepsis secondary to bilateral pneumonia / urosepsis 2) diastolic CHF  P:  - neg balance if able  RENAL  Lab 11/21/12 0445 11/20/12 0420 11/19/12 0435 11/18/12 0458 11/17/12 0455 11/16/12 0242 11/15/12  0445  NA 142 143 [id number] 138 138 -- --  K 3.8 3.7 -- -- -- -- --  CL 105 106 105104 102 102 -- --  CO2 28 29 2928 27 27 -- --  BUN 58* 56* 46*46* 36* 32* -- --  CREATININE 3.85* 3.49* 3.17*3.11*  2.65* 2.05* -- --  CALCIUM 8.2* 8.3* 8.58.5 8.5 8.2* -- --  MG -- -- -- -- 2.3 2.3 2.3  PHOS 5.2* 4.9* 4.34.3 3.3 2.8 -- --   Intake/Output      12/25 0701 - 12/26 0700 12/26 0701 - 12/27 0700   P.O. 90    I.V. (mL/kg) 230 (1.5) 20 (0.1)   NG/GT     IV Piggyback 50    Total Intake(mL/kg) 370 (2.4) 20 (0.1)   Urine (mL/kg/hr) 725 (0.2) 99   Stool 0    Total Output 725 99   Net -355 -79        Stool Occurrence 1 x     Foley:  11/10/12  A:   1) Acute on chronic renal failure 2) Hyperkalemia resolved >s/p right ureter stent 12/21 4) calciphylaxis P:   -cr rising, may need iHD again    GASTROINTESTINAL  Lab 11/21/12 0445 11/20/12 0420 11/19/12 0435 11/18/12 0458 11/17/12 0455  AST 10 -- 13 -- --  ALT <5 -- <5 -- --  ALKPHOS 90 -- 113 -- --  BILITOT 0.3 -- 0.3 -- --  PROT 7.0 -- 7.2 -- --  ALBUMIN 2.0* 2.1* 2.1*2.1* 2.0* 2.1*    A:   1)  obese P:   -dc protonix -  dc reglan  HEMATOLOGIC  Lab 11/21/12 0445 11/20/12 0420 11/19/12 0435 11/18/12 0458 11/17/12 0455 11/16/12 0242 11/15/12 0825  HGB 6.7* 7.4* 7.3* 7.1* 7.7* -- --  HCT 22.9* 24.9* 24.2* 23.7* 26.0* -- --  PLT 263 248 227 194 156 -- --  INR -- -- -- -- -- 1.23 1.32  APTT -- -- -- -- 43* 41* 38*   A:   1) Anemia likely due to chronic disease and sepsis / dilution P:   -CT no bleeding, no bleeding clinically,   -off hep >SCD  -transfuse for <7 - 1 unit 12/26  INFECTIOUS  Lab 11/21/12 0445 11/20/12 0420 11/19/12 0435 11/18/12 0458 11/17/12 0455  WBC 10.8* 13.4* 11.9* 12.9* 21.8*  PROCALCITON -- -- -- -- --    A:   1) Septic shock-E coli, urosepsis s/p Left ureter stent removal ,Right ureter stent placement on 12/21 - Bilateral pneumonia bases mild - Suppurative hidradenitis -fever 12/23 with decrease in [place] P:   -continue ceftriaxone, goal 14 days treatment -surgical input for wounds  ENDOCRINE  Lab 11/21/12 0742 11/21/12 0337 11/21/12 0036 11/20/12 2035 11/20/12 1614  GLUCAP 75 83 76  84 85   A:   DM 2 controlled hypothyroidism known P:   -SSI Controlled - synthroid   NEUROLOGIC  A:   1) Altered mental status likely due to sepsis and hypercarbia - resolved 2) 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  I have reviewed this patient's available data, including medical history, events of note, physical examination and test results as part of my evaluation  CC time x  31 m  Kara Mead MD. Shade Flood. [facility] Pager [phone] If no response call [phone]

## 2012-11-21 NOTE — Plan of Care (Signed)
Problem: Phase II Progression Outcomes Goal: Date pt extubated/weaned off vent Outcome: Completed/Met Date Met:  11/21/12 12/25

## 2012-11-21 NOTE — Evaluation (Signed)
Clinical/Bedside Swallow Evaluation Patient Details  Name: [name] MRN: [id number] Date of Birth: Mar 31, 1961  Today's Date: 11/21/2012 Time: 1140-1220 SLP Time Calculation (min): 40 min  Past Medical History:  Past Medical History  Diagnosis Date  . Systolic congestive heart failure   . SOB (shortness of breath)   . HTN (hypertension)   . DM type 2 (diabetes mellitus, type 2)   . Chronic kidney disease (CKD), stage III (moderate)   . Iron deficiency anemia   . Morbid obesity   . Hyperkalemia   . Hypothyroidism   . CHF (congestive heart failure)    Past Surgical History:  Past Surgical History  Procedure Date  . Portacath placement   . Cystoscopy w/ ureteral stent placement 05/28/2012    Procedure: CYSTOSCOPY WITH RETROGRADE PYELOGRAM/URETERAL STENT PLACEMENT;  Surgeon: Reece Packer, MD;  Location: [facility];  Service: Urology;  Laterality: Left;  . Cystoscopy w/ retrogrades 11/16/2012    Procedure: CYSTOSCOPY WITH RETROGRADE PYELOGRAM;  Surgeon: Reece Packer, MD;  Location: [facility];  Service: Urology;  Laterality: Bilateral;  CYSTOSCOPY,BILATERAL RETROGRADE PYELOGRAM/ REMOVAL LEFT URETERAL STENT/ FULGERATION BLADDER MUCOSA/ INSERTION RIGHT URETERAL STENT   HPI:      Assessment / Plan / Recommendation Clinical Impression  Patient presents with an acute reversible dysphagia with primarly origin likely prolonged intubation. Patient with intermittent immediate coughing with thin liquids due to suspected delayed swallow initiation. Cued patient for smaller sips in hopes of preventing without success. No changes in vocal quality, throat clearing, or coughing noted with trials of solids or nectar thick liquids indicative of better airway protection. Additionally, SLP assisted in repositioning patient in bed as patient obese and poor positioning likely to play a large role in increasing aspiration risk at this time. Recommend initiation of dysphagia 2 diet with nectar thick  liquids, no straws, with fully upright positioning. Prognosis for advancement good with time off vent. SLP will f/u at bedside.     Aspiration Risk  Moderate    Diet Recommendation Dysphagia 3 (Mechanical Soft);Nectar-thick liquid   Liquid Administration via: Cup;No straw Medication Administration: Crushed with puree Supervision: Patient able to self feed;Full supervision/cueing for compensatory strategies Compensations: Slow rate;Small sips/bites Postural Changes and/or Swallow Maneuvers: Seated upright 90 degrees;Upright 30-60 min after meal (position fully upright in bed or in chair for pos)    Other  Recommendations Oral Care Recommendations: Oral care BID Other Recommendations: Order thickener from pharmacy;Prohibited food (jello, ice cream, thin soups);Remove water pitcher   Follow Up Recommendations  Other (comment) (TBD pending improved function)    Frequency and Duration min 3x week  2 weeks   Pertinent Vitals/Pain n/a    SLP Swallow Goals Patient will utilize recommended strategies during swallow to increase swallowing safety with: Supervision/safety Swallow Study Goal #2 - Progress: Not met Goal #3: Patient will consume trials of thin liquids at bedside without overt s/s of aspiration to determine potential for diet upgrade.  Swallow Study Goal #3 - Progress: Not met   Swallow Study    General Type of Study: Bedside swallow evaluation Diet Prior to this Study: NPO (except ice chips) Temperature Spikes Noted: No Respiratory Status: Supplemental O2 delivered via (comment) (4 L nasal cannula) History of Recent Intubation: Yes Length of Intubations (days): 10 days Date extubated: 11/20/12 (self extubated) Behavior/Cognition: Alert;Cooperative;Pleasant mood;Hard of hearing (HOH is acute per patient and spouse) Oral Cavity - Dentition: Adequate natural dentition Self-Feeding Abilities: Able to feed self Patient Positioning: Upright in bed Baseline Vocal Quality:  Hoarse;Low vocal intensity Volitional Cough: Strong Volitional Swallow: Able to elicit    Oral/Motor/Sensory Function Overall Oral Motor/Sensory Function: Appears within functional limits for tasks assessed   Ice Chips Ice chips: Impaired Presentation: Spoon Pharyngeal Phase Impairments: Suspected delayed Swallow;Cough - Immediate   Thin Liquid Thin Liquid: Impaired Presentation: Cup;Straw;Self Fed Pharyngeal  Phase Impairments: Suspected delayed Swallow;Cough - Immediate    Nectar Thick Nectar Thick Liquid: Within functional limits Presentation: Cup;Self Fed   Honey Thick Honey Thick Liquid: Not tested   Puree Puree: Within functional limits Presentation: Spoon   Solid   GO   Shelley [place] MA, CCC-SLP [phone]  Solid: Within functional limits Presentation: [place] 11/21/2012,12:42 PM

## 2012-11-21 NOTE — Progress Notes (Signed)
Patient ID: [name], female   DOB: [birth year], 51 y.o.   MRN: [id number]  This is a patient that is familiar to our service from a previous admission in September of this year. (see consult and progress notes from 08/16/12 through 08/20/12) We were  asked to see the patient on consult for her extensive hydradenitis; and put forth our recommendations several times to the patient. The outcome of that encounter was that the patient decided to not have anything done and we signed off.  Dr. Elsworth Soho asked again to see this patient for the same complaint today; as I informed her I once again went over our recommendations with her and her family and again she refuses to have anything done at this time.  I have informed Dr. Elsworth Soho of this and he is aware that we will again sign off at this time.   Robinette Haines ACNP [facility] Surgery Pager # [phone]  11/21/12 1:28pm

## 2012-11-21 NOTE — Evaluation (Signed)
Physical Therapy Evaluation Patient Details Name: [name] MRN: [id number] DOB: January 31, 1961 Today's Date: 11/21/2012 Time: [id number] PT Time Calculation (min): 40 min  PT Assessment / Plan / Recommendation Clinical Impression  admitted with sepsis and respiratory distress.  Pt. intubated 12/15-12/25.  She demonstrates Lt. LE weakness and general deconditioning as well as new onset hearing loss possibly due to meds. Utilized word on computer for communication with patient. Pt will benefit from skilled PT in the acute care setting in order to maximize functional mobility and strength. Pt will make an excellent rehab candidate as she is motivated for quick improvements.     PT Assessment  Patient needs continued PT services    Follow Up Recommendations  CIR;Supervision/Assistance - 24 hour    Does the patient have the potential to tolerate intense rehabilitation      Barriers to Discharge        Equipment Recommendations       Recommendations for Other Services Rehab consult   Frequency Min 3X/week    Precautions / Restrictions Precautions Precautions: Fall Restrictions Weight Bearing Restrictions: No   Pertinent Vitals/Pain Pain 4/10 in left leg. RN aware.       Mobility  Bed Mobility Bed Mobility: Supine to Sit;Sitting - Scoot to [employer] of Bed;Sit to Supine Supine to Sit: 1: +2 Total assist;With rails Supine to Sit: Patient Percentage: 20% Sitting - Scoot to Edge of Bed: 1: +1 Total assist Sit to Supine: 1: +2 Total assist Sit to Supine: Patient Percentage: 30% Details for Bed Mobility Assistance: Pt. requires assist for all aspects due to body habitus  Transfers Transfers: Sit to Stand;Stand to Sit Sit to Stand: 1: +2 Total assist;From bed;From elevated surface;With upper extremity assist Sit to Stand: Patient Percentage: 20% Stand to Sit: 1: +2 Total assist;With upper extremity assist;To bed;To elevated surface Stand to Sit: Patient Percentage: 20% Details for  Transfer Assistance: Pt. only able to clear buttocks from bed ~2".  unable to achieve standing position.  Utilizes momentum in attempts to assist with standing Ambulation/Gait Ambulation/Gait Assistance: Not tested (comment)    Shoulder Instructions     Exercises     PT Diagnosis: Difficulty walking;Generalized weakness  PT Problem List: Decreased strength;Decreased activity tolerance;Decreased balance;Decreased mobility;Decreased knowledge of use of DME;Decreased safety awareness;Decreased knowledge of precautions;Obesity PT Treatment Interventions: DME instruction;Functional mobility training;Therapeutic activities;Therapeutic exercise;Balance training;Patient/family education   PT Goals Acute Rehab PT Goals PT Goal Formulation: With patient Time For Goal Achievement: 12/05/12 Potential to Achieve Goals: Fair Pt will go Supine/Side to Sit: with supervision PT Goal: Supine/Side to Sit - Progress: Goal set today Pt will go Sit to Supine/Side: with supervision PT Goal: Sit to Supine/Side - Progress: Goal set today Pt will go Sit to Stand: with min assist PT Goal: Sit to Stand - Progress: Goal set today Pt will go Stand to Sit: with min assist PT Goal: Stand to Sit - Progress: Goal set today Pt will Transfer Bed to Chair/Chair to Bed: with mod assist PT Transfer Goal: Bed to Chair/Chair to Bed - Progress: Goal set today  Visit Information  Last PT Received On: 11/21/12 Assistance Needed: +3 or more (unable to stand with +2 assist)    Subjective Data  Patient Stated Goal: to be able to walk again   Prior Functioning  Home Living Lives With: Spouse Available Help at Discharge: Family;Available PRN/intermittently (husband works during the day) Type of Home: House Home Access: Stairs to enter [employer] of Steps: 2 Entrance Stairs-Rails: Left  Home Layout: One level (Pt. 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) Dominant Hand: Right    Cognition  Overall Cognitive Status: Impaired Area of Impairment: Memory Arousal/Alertness: Awake/alert Orientation Level: Disoriented to;Situation Behavior During Session: WFL for tasks performed Memory Deficits: Pt asking the same questions multiple times    Extremity/Trunk Assessment Right Upper Extremity Assessment RUE ROM/Strength/Tone: Within functional levels RUE Coordination: WFL - gross/fine motor Left Upper Extremity Assessment LUE ROM/Strength/Tone: Within functional levels LUE Coordination: WFL - gross/fine motor Right Lower Extremity Assessment RLE ROM/Strength/Tone: Deficits RLE ROM/Strength/Tone Deficits: grossly 3/5 RLE Sensation: Deficits RLE Sensation Deficits: decreased sensation below ankle Left Lower Extremity Assessment LLE ROM/Strength/Tone: Deficits LLE ROM/Strength/Tone Deficits: grossly 4/5 LLE Sensation: WFL - Light Touch Trunk Assessment Trunk Assessment: Normal   Balance Balance Balance Assessed: Yes Static Sitting Balance Static Sitting - Balance Support: No upper extremity supported Static Sitting - Level of Assistance: 5: Stand by assistance;3: Mod assist Static Sitting - Comment/# of Minutes: Pt initially required mod A, but progressed to supervision sitting EOB  End of Session PT - End of Session Activity Tolerance: Patient limited by fatigue Patient left: in bed;with call bell/phone within reach Nurse Communication: Mobility status  GP     Ambrose [place] 11/21/2012, 5:32 PM

## 2012-11-21 NOTE — Evaluation (Signed)
Occupational Therapy Evaluation Patient Details Name: [name] MRN: [id number] DOB: 19-Jun-1961 Today's Date: 11/21/2012 Time: [id number] OT Time Calculation (min): 40 min  OT Assessment / Plan / Recommendation Clinical Impression  This 51 y.o. female admitted with sepsis and respiratory distress.  Pt. intubated 12/15-12/25.  She demonstrates Lt. LE weakness and general deconditioning as well as new onset hearing loss possibly due to meds.  Pt. will benefit from OT for the below listed deficits to allow her to return to mod A level with post acute rehab    OT Assessment  Patient needs continued OT Services    Follow Up Recommendations  CIR;Supervision/Assistance - 24 hour    Barriers to Discharge Decreased caregiver support Husband works during the day  Equipment Recommendations  None recommended by OT    Recommendations for Other Services Rehab consult  Frequency  Min 2X/week    Precautions / Restrictions Precautions Precautions: Fall Restrictions Weight Bearing Restrictions: No       ADL  Eating/Feeding: Supervision/safety Where Assessed - Eating/Feeding: Bed level Grooming: Wash/dry hands;Wash/dry face;Teeth care;Supervision/safety Where Assessed - Grooming: Supported sitting Upper Body Bathing: Moderate assistance Where Assessed - Upper Body Bathing: Unsupported sitting Lower Body Bathing: +2 Total assistance Lower Body Bathing: Patient Percentage: 10% Where Assessed - Lower Body Bathing: Rolling right and/or left;Supine, head of bed up Upper Body Dressing: Maximal assistance Where Assessed - Upper Body Dressing: Unsupported sitting Lower Body Dressing: +1 Total assistance Where Assessed - Lower Body Dressing: Supine, head of bed up;Rolling right and/or left Toilet Transfer: +1 Total assistance (pt unable) Toileting - Clothing Manipulation and Hygiene: +1 Total assistance Where Assessed - Toileting Clothing Manipulation and Hygiene: Rolling right and/or  left Tub/Shower Transfer: +1 Total assistance (pt unable) Transfers/Ambulation Related to ADLs: Attempted sit to stand, only cleared buttocks ~2" from bed.  Unable to achieve standing  ADL Comments: Pt. able to assist with UB ADLs.  Pt. unable to achieve standing for LB ADLs    OT Diagnosis: Generalized weakness;Acute pain;Cognitive deficits  OT Problem List: Decreased strength;Decreased activity tolerance;Impaired balance (sitting and/or standing);Decreased cognition;Decreased knowledge of use of DME or AE;Obesity;Pain OT Treatment Interventions: Self-care/ADL training;Therapeutic exercise;DME and/or AE instruction;Therapeutic activities;Patient/family education;Balance training;Cognitive remediation/compensation   OT Goals Acute Rehab OT Goals OT Goal Formulation: With patient Time For Goal Achievement: 12/05/12 Potential to Achieve Goals: Good ADL Goals Pt Will Perform Grooming: with mod assist;Standing at sink;Supported ADL Goal: Grooming - Progress: Goal set today Pt Will Perform Upper Body Bathing: with supervision;Sitting, chair ADL Goal: Upper Body Bathing - Progress: Goal set today Pt Will Perform Lower Body Bathing: with mod assist;Sit to stand from chair;Sit to stand from bed ADL Goal: Lower Body Bathing - Progress: Goal set today Pt Will Perform Upper Body Dressing: with supervision;Sitting, bed;Sitting, chair ADL Goal: Upper Body Dressing - Progress: Goal set today Pt Will Perform Lower Body Dressing: with mod assist;Sit to stand from chair;Sit to stand from bed ADL Goal: Lower Body Dressing - Progress: Goal set today Pt Will Transfer to Toilet: with mod assist;Stand pivot transfer;Extra wide 3-in-1 ADL Goal: Toilet Transfer - Progress: Goal set today Pt Will Perform Toileting - Clothing Manipulation: with mod assist;Standing ADL Goal: Toileting - Clothing Manipulation - Progress: Goal set today Additional ADL Goal #1: Pt will recall precautions with min cues ADL Goal:  Additional Goal #1 - Progress: Goal set today Additional ADL Goal #2: Pt will be independent with thera band HEP for general strengthening/conditioning ADL Goal: Additional Goal #2 - Progress: Goal  set today  Visit Information  Last OT Received On: 11/21/12 Assistance Needed: +3 or more (unable to stand with +2 assist) PT/OT Co-Evaluation/Treatment: Yes    Subjective Data  Subjective: "Why do I have to have thick water?" Patient Stated Goal: To to into the bathroom    Prior [place] Lives With: Spouse Available Help at Discharge: Family;Available PRN/intermittently (husband works during the day) Type of Home: House Home Access: Stairs to enter Technical brewer of Steps: 2 Entrance Stairs-Rails: Left Home Layout: One level (Pt. 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 due to meds) Dominant Hand: Right         Vision/Perception Vision - Assessment Additional Comments: Pt. able to read computer screen as a means of communication   Cognition  Overall Cognitive Status: Impaired Area of Impairment: Memory Arousal/Alertness: Awake/alert Orientation Level: Disoriented to;Situation Behavior During Session: WFL for tasks performed Memory Deficits: Pt asking the same questions multiple times    Extremity/Trunk Assessment Right Upper Extremity Assessment RUE ROM/Strength/Tone: Within functional levels RUE Coordination: WFL - gross/fine motor Left Upper Extremity Assessment LUE ROM/Strength/Tone: Within functional levels LUE Coordination: WFL - gross/fine motor Trunk Assessment Trunk Assessment: Normal     Mobility Bed Mobility Bed Mobility: Supine to Sit;Sitting -  Scoot to [employer] of Bed;Sit to Supine Supine to Sit: 1: +2 Total assist;With rails Supine to Sit: Patient Percentage: 20% Sitting - Scoot to Edge of Bed: 1: +1 Total assist Sit to Supine: 1: +2 Total assist Sit to Supine: Patient Percentage: 30% Details for Bed Mobility Assistance: Pt. requires assist for all aspects due to body habitus  Transfers Transfers: Sit to Stand;Stand to Sit Sit to Stand: 1: +2 Total assist;From bed;From elevated surface;With upper extremity assist Sit to Stand: Patient Percentage: 20% Stand to Sit: 1: +2 Total assist;With upper extremity assist;To bed;To elevated surface Stand to Sit: Patient Percentage: 20% Details for Transfer Assistance: Pt. only able to clear buttocks from bed ~2".  unable to achieve standing position.  Utilizes momentum in attempts to assist with standing     Shoulder Instructions     Exercise     Balance Balance Balance Assessed: Yes Static Sitting Balance Static Sitting - Balance Support: No upper extremity supported Static Sitting - Level of Assistance: 5: Stand by assistance;3: Mod assist Static Sitting - Comment/# of Minutes: Pt initially required mod A, but progressed to supervision sitting EOB   End of Session OT - End of Session Activity Tolerance: Patient tolerated treatment well Patient left: in bed;with call bell/phone within reach Nurse Communication: Mobility status;Need for lift equipment  Binger, Ellard Artis M 11/21/2012, 5:15 PM

## 2012-11-21 NOTE — Progress Notes (Signed)
Pt bipap was placed on standby earlier for non tolerance and anxiety. Pt is now on a 4lpm Progress with saturations of 100% and rr between 21-30bpm

## 2012-11-22 LAB — GLUCOSE, CAPILLARY
Glucose-Capillary: 111 mg/dL — ABNORMAL HIGH (ref 70–99)
Glucose-Capillary: 149 mg/dL — ABNORMAL HIGH (ref 70–99)
Glucose-Capillary: 121 mg/dL — ABNORMAL HIGH (ref 70–99)

## 2012-11-22 LAB — CBC
MCH: 28.3 pg (ref 26.0–34.0)
MCHC: 30.3 g/dL (ref 30.0–36.0)
MCV: 93.4 fL (ref 78.0–100.0)
Platelets: 260 10*3/uL (ref 150–400)
RBC: 2.58 MIL/uL — ABNORMAL LOW (ref 3.87–5.11)

## 2012-11-22 LAB — TYPE AND SCREEN

## 2012-11-22 LAB — RENAL FUNCTION PANEL
CO2: 28 mEq/L (ref 19–32)
Calcium: 8 mg/dL — ABNORMAL LOW (ref 8.4–10.5)
Creatinine, Ser: 3.97 mg/dL — ABNORMAL HIGH (ref 0.50–1.10)
GFR calc non Af Amer: 12 mL/min — ABNORMAL LOW (ref >90)
Phosphorus: 4.9 mg/dL — ABNORMAL HIGH (ref 2.3–4.6)

## 2012-11-22 LAB — CULTURE, BAL-QUANTITATIVE W GRAM STAIN: Special Requests: NORMAL

## 2012-11-22 MED ORDER — RESOURCE THICKENUP CLEAR PO POWD
ORAL | Status: DC | PRN
  Filled 2012-11-22: qty 125

## 2012-11-22 NOTE — Progress Notes (Signed)
Follow-up: Second attempt to contact and spoke with Mr Manzanares (c: [phone]) PMT meeting time confirmed for tomorrow, Saturday 12/28 @ 1:30 pm  Danton Sewer, RN 11/22/2012, 4:40 PM Palliative Medicine Team RN Liaison [phone]

## 2012-11-22 NOTE — Progress Notes (Signed)
Patient seen and examined on 12/26 with JD, NP The hidradenitis is really quiescent and no surgical indication at this time

## 2012-11-22 NOTE — Progress Notes (Signed)
Name: [name] MRN: [id number] DOB: 03/15/1961    LOS: 56  PULMONARY / CRITICAL CARE MEDICINE  HPI:   50 years old morbid obese female with complex PMH relevant for systolic CHF with LVEF of 35 to 40%, HTN, DM2, hypothyroidism, chronic pain on narcotics and CKD being evaluated by nephrology for dialysis. She also has a complex history of suppurative hidradenitis and is on chronic antibiotic therapy. In icu for PNA,e coli  septic shock, ARF requiring dialysis  Abx: 12/15 vanc>>>12/15 12/15 zosyn>>>12/17 12/16 levofloxacin>>>12/17 12/16 ceftriaxone>>>12/29 (planned)  Cultures: 12/15 BC x 2>>>E coli sens ceftr 12/15 urine>>>E coli (variable res) 12/15 tracheal >>>NF  Lines: 12/15 left IJ >>> 12/15  Rt rad>>>12/19 12/15 ETT>>>12/25  12/16 rt fem HD >>>12/25  Events/ Studeis: 12/15 >>> Arrest 12/15 renal US>>Suboptimal examination. There may be mild pelvicaliectasis on the<BR>right.<BR> <BR>Left kidney not visualized 12/16>>>ARF, shock, started on cvvhd 12/16 echo - 55% EF, mod reduced rv fxn, PA 46 12/17- pressors better, K resolved  12/17 CT chest- bibasilr small infiltrates / atx 12/17 CT abdo/pelvis>>>Left ureteral stent placement following removal of left  ureteral calculi. No residual left-sided hydronephrosis or ureteral stone fragments. Apparent lithotripsy of the right renal calculi with obstructing 10 mm stone fragment at the right ureteral pelvic junction. There are small renal calyceal stone fragments bilaterally. Anasarca 12/18- off pressors, weaning, awake 12/18- urology called, remains off pressors, failed weaning 12/20- neg balance 12/21 >tranx WL for uro proced>Cystoscopy, left ureter stent removal, bilateral retrograde pyelogram, right ureter stent placement 12/24 bscopy for RUL atx  Interval [name] :  Low grade temp Alert this am, hard of hearing, frustrated - wants to go home  UOP low Did not participate with PT today, sat up on 12/26   Vital  Signs: Temp:  [98 F (36.7 C)-100.4 F (38 C)] 98.8 F (37.1 C) (12/27 0820) Pulse Rate:  [70-83] 79  (12/27 0900) Resp:  [0-29] 20  (12/27 0900) BP: (96-139)/(37-95) 126/52 mmHg (12/27 0900) SpO2:  [93 %-100 %] 99 % (12/27 0900) FiO2 (%):  [39.5 %-40.5 %] 40 % (12/27 0000) Weight:  [148 kg (326 lb 4.5 oz)] 148 kg (326 lb 4.5 oz) (12/27 0200)  Physical Examination: General:  Morbid obese Neuro:   follows commands, conversant, hoarse voice-improving, hard of hearing  HEENT: no jvd Neck:  Supple, no JVD   Cardiovascular:  RRR, no M/R/G, 1+edema  Lungs:  diminshed throughout, tachypneic Abdomen:  Soft, nontender, nondistended, bowel sounds present improved, obese massive Musculoskeletal:  Moves all extremities Skin: hidradenitis / chronic lichenified wounds below breast  Principal Problem:  *Sepsis Active Problems:  Acute respiratory failure with hypoxia  Atelectasis   ASSESSMENT AND PLAN  PULMONARY  Lab 11/18/12 0415 11/17/12 0445  PHART 7.393 7.340*  PCO2ART 45.6* 51.3*  PO2ART 150.0* 129.0*  HCO3 26.8* 26.8*  O2SAT 99.6 99.1   Ventilator Settings: Vent Mode:  [-]  FiO2 (%):  [39.5 %-40.5 %] 40 % CXR:lt hazy infiltrates ? Edema vs effusion, big heart-no sign change   A:   1) Acute hypoxemic and hypercarbic respiratory failure - resolved 2) RUL pneumonia vs atx - resolved post bscopy  P:   -BiPAP qhs & during sleep -chest PT, mobilise with PT   CARDIOVASCULAR No results found for this basename: TROPONINI:5,LATICACIDVEN:5, O2SATVEN:5,PROBNP:5 in the last 168 hours A:  1) Sepsis secondary to bilateral pneumonia / urosepsis 2) diastolic CHF  P:  - neg balance if able  RENAL  Lab 11/22/12 0447 11/21/12 0445 11/20/12 0420  11/19/12 0435 11/18/12 0458 11/17/12 0455 11/16/12 0242  NA 143 142 143 141139 138 -- --  K 3.7 3.8 -- -- -- -- --  CL 106 105 106 105104 102 -- --  CO2 28 28 29  2928 27 -- --  BUN 63* 58* 56* 46*46* 36* -- --  CREATININE 3.97*  3.85* 3.49* 3.17*3.11* 2.65* -- --  CALCIUM 8.0* 8.2* 8.3* 8.58.5 8.5 -- --  MG -- -- -- -- -- 2.3 2.3  PHOS 4.9* 5.2* 4.9* 4.34.3 3.3 -- --   Intake/Output      12/26 0701 - 12/27 0700 12/27 0701 - 12/28 0700   P.O. 590    I.V. (mL/kg) 175.3 (1.2)    Blood 350    IV Piggyback     Total Intake(mL/kg) 1115.3 (7.5)    Urine (mL/kg/hr) 525 (0.1) 127 (0.2)   Total Output 525 127   Net +590.3 -127        Stool Occurrence 1 x     Foley:  11/10/12  A:   1) Acute on chronic renal failure 2) Hyperkalemia resolved >s/p right ureter stent 12/21 4) calciphylaxis P:   -cr plateau-ing, may need iHD again - If so will need permacath placed    GASTROINTESTINAL  Lab 11/22/12 0447 11/21/12 0445 11/20/12 0420 11/19/12 0435 11/18/12 0458  AST -- 10 -- 13 --  ALT -- <5 -- <5 --  ALKPHOS -- 90 -- 113 --  BILITOT -- 0.3 -- 0.3 --  PROT -- 7.0 -- 7.2 --  ALBUMIN 2.1* 2.0* 2.1* 2.1*2.1* 2.0*    A:   1)  obese P:   -dc protonix -  dc reglan  HEMATOLOGIC  Lab 11/22/12 0448 11/21/12 0445 11/20/12 0420 11/19/12 0435 11/18/12 0458 11/17/12 0455 11/16/12 0242  HGB 7.3* 6.7* 7.4* 7.3* 7.1* -- --  HCT 24.1* 22.9* 24.9* 24.2* 23.7* -- --  PLT 260 263 248 227 194 -- --  INR -- -- -- -- -- -- 1.23  APTT -- -- -- -- -- 43* 41*   A:   1) Anemia likely due to chronic disease and sepsis / dilution P:   -CT no bleeding, no bleeding clinically,   -off hep >SCD  -transfuse for <7 - 1 unit 12/26  INFECTIOUS  Lab 11/22/12 0448 11/21/12 0445 11/20/12 0420 11/19/12 0435 11/18/12 0458  WBC 13.1* 10.8* 13.4* 11.9* 12.9*  PROCALCITON -- -- -- -- --    A:   1) Septic shock-E coli, urosepsis s/p Left ureter stent removal ,Right ureter stent placement on 12/21 - Bilateral pneumonia bases mild - Suppurative hidradenitis -fever 12/23 with decrease in WC P:   -continue ceftriaxone, goal 14 days treatment -Took surgical input for wounds - she does not want invasive procedures or does not want  to go to [facility] for reconstruction  ENDOCRINE  Lab 11/22/12 0837 11/22/12 0043 11/21/12 1952 11/21/12 1617 11/21/12 1245  GLUCAP 106* 135* 147* 116* 82   A:   DM 2 controlled hypothyroidism known P:   -SSI Controlled - synthroid   NEUROLOGIC  A:   1) Altered mental status likely due to sepsis and hypercarbia - resolved 2) deafness -? New - Lasix not a cause per renal , only received 1-2 doses of vanc P:   Mobilise Monitor hearing - will need evaln for sensorineural cause incl audiogram likely as outpt Advance PO per [place] eval  GLOBAL - She has multiple ongoing issues - possible HD, wounds & severe deconditioning with poor general  medical condition. She refuses  Therapy intermittently & would like to go home. Will involve palliative care here to re-establish goals of care & take husband's input  BEST PRACTICE / DISPOSITION - Level of Care:  ICU - OK to transfer to tele 6700 - Primary Service:  PCCM - Consultants:  Nephrology, urology ([facility]) - Code Status:  Full code - Diet:  Tf 12/17>>> - DVT Px:  SCD  - GI Px:  Protonix IV - Skin Integrity:  Severe suppurative hidradenitis  - Social / Family: updated     Care during the described time interval was provided by me and/or other providers on the critical care team.  I have reviewed this patient's available data, including medical history, events of note, physical examination and test results as part of my evaluation  CC time x  31 m  Kara Mead MD. Shade Flood. [facility] Pager [phone] If no response call [phone]

## 2012-11-22 NOTE — Progress Notes (Signed)
Clinical Social Work Department CLINICAL SOCIAL WORK PLACEMENT NOTE 11/22/2012  Patient:  [facility]  Account Number:  [id number] Admit date:  11/10/2012  Clinical Social Worker:  Sindy Messing, LCSW  Date/time:  11/22/2012 03:00 PM  Clinical Social Work is seeking post-discharge placement for this patient at the following level of care:   [place]   (*CSW will update this form in Epic as items are completed)   11/22/2012  Patient/family provided with [place] Department of Clinical Social Work's list of facilities offering this level of care within the geographic area requested by the patient (or if unable, by the patient's family).  11/22/2012  Patient/family informed of their freedom to choose among providers that offer the needed level of care, that participate in Medicare, Medicaid or managed care program needed by the patient, have an available bed and are willing to accept the patient.  11/22/2012  Patient/family informed of [facility]' ownership interest in [facility], as well as of the fact that they are under no obligation to receive care at this facility.  PASARR submitted to [employer] on  PASARR number received from [facility] on   FL2 transmitted to all facilities in geographic area requested by pt/family on   FL2 transmitted to all facilities within larger geographic area on   Patient informed that his/her managed care company has contracts with or will negotiate with  certain facilities, including the following:     Patient/family informed of bed offers received:   Patient chooses bed at  Physician recommends and patient chooses bed at    Patient to be transferred to  on   Patient to be transferred to facility by   The following physician request were entered in Epic:   Additional Comments:

## 2012-11-22 NOTE — Progress Notes (Signed)
Patient ID: Kasarah Moreno, female   DOB: September 21, 1961, 51 y.o.   MRN: [id number] S:No new complaints O:BP 139/61  Pulse 78  Temp 98.2 F (36.8 C) (Oral)  Resp 16  Ht 5' 6.14" (1.68 m)  Wt 148 kg (326 lb 4.5 oz)  BMI 52.44 kg/m2  SpO2 97%  Intake/Output Summary (Last 24 hours) at 11/22/12 0846 Last data filed at 11/22/12 0600  Gross per 24 hour  Intake 1105.33 ml  Output    470 ml  Net 635.33 ml   Intake/Output: I/O last 3 completed shifts: In: 1315.3 [P.O.:680; I.V.:285.3; Blood:350] Out: [id number] [Urine:875]  Intake/Output this shift:    Weight change: -7.4 kg (-16 lb 5 oz) Gen:WD WN obese AAF in NAD, HOH CVS:no rub Resp:CTA with decreased BS at bases [id number] Ext:no edema   Lab 11/22/12 0447 11/21/12 0445 11/20/12 0420 11/19/12 0435 11/18/12 0458 11/17/12 0455 11/16/12 1600  NA 143 142 143 141139 138 138 136  K 3.7 3.8 3.7 3.63.6 3.9 4.1 3.9  CL 106 105 106 105104 102 102 101  CO2 28 28 29  2928 27 27 27   GLUCOSE 120* 88 127* 131*129* 93 116* 107*  BUN 63* 58* 56* 46*46* 36* 32* 35*  CREATININE 3.97* 3.85* 3.49* 3.17*3.11* 2.65* 2.05* 2.31*  ALBUMIN 2.1* 2.0* 2.1* 2.1*2.1* 2.0* 2.1* 2.1*  CALCIUM 8.0* 8.2* 8.3* 8.58.5 8.5 8.2* 8.1*  PHOS 4.9* 5.2* 4.9* 4.34.3 3.3 2.8 2.7  AST -- 10 -- 13 -- -- --  ALT -- <5 -- <5 -- -- --   Liver Function Tests:  Lab 11/22/12 0447 11/21/12 0445 11/20/12 0420 11/19/12 0435  AST -- 10 -- 13  ALT -- <5 -- <5  ALKPHOS -- 90 -- 113  BILITOT -- 0.3 -- 0.3  PROT -- 7.0 -- 7.2  ALBUMIN 2.1* 2.0* 2.1* --   No results found for this basename: LIPASE:3,AMYLASE:3 in the last 168 hours No results found for this basename: AMMONIA:3 in the last 168 hours CBC:  Lab 11/22/12 0448 11/21/12 0445 11/20/12 0420 11/19/12 0435 11/18/12 0458 11/16/12 0242  WBC 13.1* 10.8* 13.4* -- -- --  NEUTROABS -- -- -- -- -- 16.3*  HGB 7.3* 6.7* 7.4* -- -- --  HCT 24.1* 22.9* 24.9* -- -- --  MCV 93.4 95.0 94.3 93.8 92.9 --  PLT 260 263 248 -- -- --    Cardiac Enzymes: No results found for this basename: CKTOTAL:5,CKMB:5,CKMBINDEX:5,TROPONINI:5 in the last 168 hours CBG:  Lab 11/22/12 0043 11/21/12 1952 11/21/12 1617 11/21/12 1245 11/21/12 0742  GLUCAP 135* 147* 116* 82 75    Iron Studies: No results found for this basename: IRON,TIBC,TRANSFERRIN,FERRITIN in the last 72 hours Studies/Results: No results found.    Marland Kitchen albuterol  2.5 mg Nebulization Q6H  . cefTRIAXone (ROCEPHIN)  IV  2 g Intravenous Q24H  . cinacalcet  90 mg Oral Q breakfast  . darbepoetin (ARANESP) injection - NON-DIALYSIS  200 mcg Subcutaneous Q Thu-1800  . dextrose  1 ampule Intravenous Once  . DULoxetine  60 mg Oral Daily  . insulin aspart  0-20 Units Subcutaneous TID WC  . ipratropium  0.5 mg Nebulization Q6H  . levothyroxine  12.5 mcg Intravenous Daily  . paricalcitol  2 mcg Intravenous Q M,W,F    BMET    Component Value Date/Time   NA 143 11/22/2012 0447   K 3.7 11/22/2012 0447   CL 106 11/22/2012 0447   CO2 28 11/22/2012 0447   GLUCOSE 120* 11/22/2012 0447   BUN 63* 11/22/2012  0447   CREATININE 3.97* 11/22/2012 0447   CREATININE 1.90* 03/07/2012 1652   CALCIUM 8.0* 11/22/2012 0447   GFRNONAA 12* 11/22/2012 0447   GFRAA 14* 11/22/2012 0447   CBC    Component Value Date/Time   WBC 13.1* 11/22/2012 0448   WBC 17.8* 06/01/2008 1547   RBC 2.58* 11/22/2012 0448   RBC 4.50 06/01/2008 1547   HGB 7.3* 11/22/2012 0448   HGB 10.1* 06/01/2008 1547   HCT 24.1* 11/22/2012 0448   HCT 33.0* 06/01/2008 1547   PLT 260 11/22/2012 0448   PLT 452* 06/01/2008 1547   MCV 93.4 11/22/2012 0448   MCV 73.2* 06/01/2008 1547   MCH 28.3 11/22/2012 0448   MCH 22.5* 06/01/2008 1547   MCHC 30.3 11/22/2012 0448   MCHC 30.7* 06/01/2008 1547   RDW 18.2* 11/22/2012 0448   RDW 18.4* 06/01/2008 1547   LYMPHSABS 1.1 11/16/2012 0242   LYMPHSABS 1.1 06/01/2008 1547   MONOABS 0.6 11/16/2012 0242   MONOABS 0.6 06/01/2008 1547   EOSABS 0.5 11/16/2012 0242   EOSABS 0.5 06/01/2008 1547    BASOSABS 0.0 11/16/2012 0242   BASOSABS 0.0 06/01/2008 1547     Assessment/Plan:  Assessment/Plan:  1. AKI/CKD slowly ^ Cr . Vol xs but stable. Cont to hold off on more HD.  Cont to follow UOP and daily Scr  2. Anemia worsened, suspect with bleeding. S/p transfusion with 1 unit PRBC, slight increase in Hgb.  3. Pneu on AB seems better  4. E coli sepsis on Rocephin  5. Obstructive Uropathy stent now on R will cont to follow UOP and daily Scr  6. DM stabel  7. Morbid obesity.  8. Hydrad suppurativa- has declined surgical intervention here and will likely require extensive reconstruction at [facility]  9. ? 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  Will follow Ca levels. 10. Protein malnutrition- cont with prot supple 11. Loss of hearing- ?drug related.  Consider ENT eval   Wilmarie Sparlin A

## 2012-11-22 NOTE — Progress Notes (Signed)
Rehab Admissions Coordinator Note:  Patient was screened by Retta Diones for appropriateness for an Inpatient Acute Rehab Consult.  At this time, we are recommending Inpatient Rehab consult.  Noted PT and OT recommending inpatient rehab.  Retta Diones 11/22/2012, 8:30 AM  I can be reached at [phone].

## 2012-11-22 NOTE — Progress Notes (Signed)
PT Cancellation Note  Patient Details Name: [name] MRN: [id number] DOB: [birth year]   Cancelled Treatment:    Reason Eval/Treat Not Completed: Other (comment) (pt tearful and stating she just can't do it today.  )Will f/u another time.     Catarina Hartshorn, [place] 11/22/2012, 11:06 AM

## 2012-11-22 NOTE — Progress Notes (Signed)
Speech Language Pathology Dysphagia Treatment Patient Details Name: [name] MRN: [id number] DOB: [birth year] Today's Date: 11/22/2012 Time: [id number] SLP Time Calculation (min): 32 min  Assessment / Plan / Recommendation Clinical Impression  SLP provided diagnostic po trials at bedside to determine potential to advance today. Patient continues to present with hoarse, raspy vocal quality however able to consume clinician provided thin liquid trials with minimal overt indication of aspiration characterized by strong cough post swallow with multiple large sips. With cueing for small single sips, patient with improved s/s of airway protection without changes in vocal quality or overt coughing/throat clearing. Recommend advancing to thin liquids with use of strict aspiration precautions which were reviewed with patient via written information as patient remains significantly hard of hearing. SLP will continue to f/u at bedside.     Diet Recommendation  Initiate / Change Diet: Dysphagia 3 (mechanical soft);Thin liquid    SLP Plan Continue with current plan of care   Pertinent Vitals/Pain n/a   Swallowing Goals  SLP Swallowing Goals Patient will utilize recommended strategies during swallow to increase swallowing safety with: Supervision/safety Swallow Study Goal #2 - Progress: Progressing toward goal Goal #3: Patient will consume trials of thin liquids at bedside without overt s/s of aspiration to determine potential for diet upgrade.  Swallow Study Goal #3 - Progress: Progressing toward goal  General Temperature Spikes Noted: No Respiratory Status: Supplemental O2 delivered via (comment) (2 L nasal cannula) Behavior/Cognition: Alert;Cooperative;Pleasant mood;Hard of hearing Oral Cavity - Dentition: Adequate natural dentition Patient Positioning: Postural control interferes with function (slightly reclined due to obesity and poor positioning)  Oral Cavity - Oral Hygiene Patient is HIGH  RISK - Oral Care Protocol followed (see row info): Yes   Dysphagia Treatment Treatment focused on: Skilled observation of diet tolerance;Patient/family/caregiver education;Utilization of compensatory strategies Treatment Methods/Modalities: Skilled observation;Differential diagnosis Patient observed directly with PO's: Yes Type of PO's observed: Dysphagia 3 (soft);Dysphagia 1 (puree);Thin liquids;Nectar-thick liquids Feeding: Able to feed self Liquids provided via: Cup Pharyngeal Phase Signs & Symptoms: Suspected delayed swallow initiation;Immediate cough Type of cueing: Verbal;Tactile;Visual Amount of cueing: Moderate   GO   Shelley Rainwater MA, CCC-SLP [phone]   [name] [name] 11/22/2012, 10:19 AM

## 2012-11-22 NOTE — Progress Notes (Signed)
Clinical Social Work Department BRIEF PSYCHOSOCIAL ASSESSMENT 11/22/2012  Patient:  Shelley Ca [facility]     Account Number:  [id number]     Admit date:  11/10/2012  Clinical Social Worker:  Earlie Server  Date/Time:  11/22/2012 01:30 PM  Referred by:  Physician  Date Referred:  11/22/2012 Referred for  SNF Placement   Other Referral:   Interview type:  Patient Other interview type:    PSYCHOSOCIAL DATA Living Status:  FAMILY Admitted from facility:   Level of care:   Primary support name:  [name] Primary support relationship to patient:  SPOUSE Degree of support available:   Strong    CURRENT CONCERNS Current Concerns  Post-Acute Placement   Other Concerns:    SOCIAL WORK ASSESSMENT / PLAN CSW received referral to assist with dc planning. CSW reviewed chart and met with patient at bedside. No visitors present.    CSW introduced myself and explained role. Patient reports trouble hearing so CSW wrote notes and patient would respond. Patient lived with husband PTA. Patient reports husband works but her mom could assist her. CSW and patient discussed patient's ability to ambulate at home. Patient reports she has stairs at home and unsure how she would ambulate. CSW and patient discussed CIR and SNF options. Patient is hesitant about staying anywhere other home.    CSW and patient discussed her ability to be safe at home and ambulate. Patient refusing SNF and reports she wants to go home. CSW encouraged patient to discuss plans with husband and CSW will follow up. Per chart review, PMT to schedule [place] meeting. CSW will continue to follow.   Assessment/plan status:  Psychosocial Support/Ongoing Assessment of Needs Other assessment/ plan:   Information/referral to community resources:   SNF list    PATIENT'S/FAMILY'S RESPONSE TO PLAN OF CARE: Patient alert and oriented but struggling with hearing. Patient agreeable to CSW assessment. Patient guarded and unsure of plan at this  time.

## 2012-11-22 NOTE — Progress Notes (Signed)
Palliative Medicine Team consult for goals of care requested by Dr Elsworth Soho; spoke with patient at bedside who stated she has difficulty hearing what is being said, hears better with louder voice - is agreeable to this writer contacting her husband to arrange PMT meeting to discuss goals of acre- attempted to contact patient's husband Legrand Como  h:[phone] and c: [phone] -voice message left and await c/b to schedule meeting time  Danton Sewer, RN 11/22/2012, 12:31 PM Palliative Medicine Team RN Liaison [phone]

## 2012-11-23 DIAGNOSIS — Z515 Encounter for palliative care: Secondary | ICD-10-CM

## 2012-11-23 LAB — GLUCOSE, CAPILLARY
Glucose-Capillary: 122 mg/dL — ABNORMAL HIGH (ref 70–99)
Glucose-Capillary: 113 mg/dL — ABNORMAL HIGH (ref 70–99)

## 2012-11-23 LAB — BASIC METABOLIC PANEL
BUN: 62 mg/dL — ABNORMAL HIGH (ref 6–23)
Creatinine, Ser: 3.92 mg/dL — ABNORMAL HIGH (ref 0.50–1.10)
GFR calc Af Amer: 14 mL/min — ABNORMAL LOW (ref >90)
GFR calc non Af Amer: 12 mL/min — ABNORMAL LOW (ref >90)
Potassium: 3.8 mEq/L (ref 3.5–5.1)

## 2012-11-23 LAB — CBC
MCHC: 30.3 g/dL (ref 30.0–36.0)
RDW: 17.9 % — ABNORMAL HIGH (ref 11.5–15.5)

## 2012-11-23 MED ORDER — CARVEDILOL 6.25 MG PO TABS
6.2500 mg | ORAL_TABLET | Freq: Two times a day (BID) | ORAL | Status: DC
  Administered 2012-11-23 – 2012-12-01 (×16): 6.25 mg via ORAL
  Filled 2012-11-23 (×22): qty 1

## 2012-11-23 MED ORDER — NEPRO/CARBSTEADY PO LIQD
237.0000 mL | Freq: Three times a day (TID) | ORAL | Status: DC
  Administered 2012-11-23 – 2012-11-25 (×2): 237 mL via ORAL

## 2012-11-23 MED ORDER — SACCHAROMYCES BOULARDII 250 MG PO CAPS
250.0000 mg | ORAL_CAPSULE | Freq: Two times a day (BID) | ORAL | Status: DC
  Administered 2012-11-23 – 2012-12-03 (×20): 250 mg via ORAL
  Filled 2012-11-23 (×23): qty 1

## 2012-11-23 MED ORDER — LEVOTHYROXINE SODIUM 25 MCG PO TABS
25.0000 ug | ORAL_TABLET | Freq: Every day | ORAL | Status: DC
  Administered 2012-11-24 – 2012-11-27 (×4): 25 ug via ORAL
  Filled 2012-11-23 (×5): qty 1

## 2012-11-23 NOTE — Progress Notes (Signed)
Patient ID: Cyera Garde, female   DOB: 15-Jul-1961, 51 y.o.   MRN: [id number] S:no new complaints O:BP 158/68  Pulse 72  Temp 99 F (37.2 C) (Oral)  Resp 20  Ht 5' 6.14" (1.68 m)  Wt 162.6 kg (358 lb 7.5 oz)  BMI 57.61 kg/m2  SpO2 99%  Intake/Output Summary (Last 24 hours) at 11/23/12 1118 Last data filed at 11/23/12 0400  Gross per 24 hour  Intake  971.5 ml  Output    220 ml  Net  751.5 ml   Intake/Output: I/O last 3 completed shifts: In: 1526.8 [P.O.:1400; I.V.:72.8; IV Piggyback:54] Out: 625 [Urine:625]  Intake/Output this shift:    Weight change: 14.6 kg (32 lb 3 oz) Gen:WD obese AAF in NAD CVS:no rub Resp:CTA [id number] Ext:tr edema   Lab 11/23/12 0924 11/22/12 0447 11/21/12 0445 11/20/12 0420 11/19/12 0435 11/18/12 0458 11/17/12 0455 11/16/12 1600  NA 141 143 142 143 141139 138 138 --  K 3.8 3.7 3.8 3.7 3.63.6 3.9 4.1 --  CL 104 106 105 106 105104 102 102 --  CO2 26 28 28 29  2928 27 27 --  GLUCOSE 104* 120* 88 127* 131*129* 93 116* --  BUN 62* 63* 58* 56* 46*46* 36* 32* --  CREATININE 3.92* 3.97* 3.85* 3.49* 3.17*3.11* 2.65* 2.05* --  ALBUMIN -- 2.1* 2.0* 2.1* 2.1*2.1* 2.0* 2.1* 2.1*  CALCIUM 7.6* 8.0* 8.2* 8.3* 8.58.5 8.5 8.2* --  PHOS -- 4.9* 5.2* 4.9* 4.34.3 3.3 2.8 2.7  AST -- -- 10 -- 13 -- -- --  ALT -- -- <5 -- <5 -- -- --   Liver Function Tests:  Lab 11/22/12 0447 11/21/12 0445 11/20/12 0420 11/19/12 0435  AST -- 10 -- 13  ALT -- <5 -- <5  ALKPHOS -- 90 -- 113  BILITOT -- 0.3 -- 0.3  PROT -- 7.0 -- 7.2  ALBUMIN 2.1* 2.0* 2.1* --   No results found for this basename: LIPASE:3,AMYLASE:3 in the last 168 hours No results found for this basename: AMMONIA:3 in the last 168 hours CBC:  Lab 11/23/12 0924 11/22/12 0448 11/21/12 0445 11/20/12 0420 11/19/12 0435  WBC 10.7* 13.1* 10.8* -- --  NEUTROABS -- -- -- -- --  HGB 7.4* 7.3* 6.7* -- --  HCT 24.4* 24.1* 22.9* -- --  MCV 93.5 93.4 95.0 94.3 93.8  PLT 264 260 263 -- --   Cardiac  Enzymes: No results found for this basename: CKTOTAL:5,CKMB:5,CKMBINDEX:5,TROPONINI:5 in the last 168 hours CBG:  Lab 11/23/12 0817 11/22/12 2118 11/22/12 1635 11/22/12 1152 11/22/12 0837  GLUCAP 107* 121* 149* 111* 106*    Iron Studies: No results found for this basename: IRON,TIBC,TRANSFERRIN,FERRITIN in the last 72 hours Studies/Results: No results found.    Marland Kitchen albuterol  2.5 mg Nebulization Q6H  . cefTRIAXone (ROCEPHIN)  IV  2 g Intravenous Q24H  . cinacalcet  90 mg Oral Q breakfast  . darbepoetin (ARANESP) injection - NON-DIALYSIS  200 mcg Subcutaneous Q Thu-1800  . dextrose  1 ampule Intravenous Once  . DULoxetine  60 mg Oral Daily  . insulin aspart  0-20 Units Subcutaneous TID WC  . ipratropium  0.5 mg Nebulization Q6H  . levothyroxine  12.5 mcg Intravenous Daily  . paricalcitol  2 mcg Intravenous Q M,W,F  . saccharomyces boulardii  250 mg Oral BID    BMET    Component Value Date/Time   NA 141 11/23/2012 0924   K 3.8 11/23/2012 0924   CL 104 11/23/2012 0924   CO2 26 11/23/2012 0924  GLUCOSE 104* 11/23/2012 0924   BUN 62* 11/23/2012 0924   CREATININE 3.92* 11/23/2012 0924   CREATININE 1.90* 03/07/2012 1652   CALCIUM 7.6* 11/23/2012 0924   GFRNONAA 12* 11/23/2012 0924   GFRAA 14* 11/23/2012 0924   CBC    Component Value Date/Time   WBC 10.7* 11/23/2012 0924   WBC 17.8* 06/01/2008 1547   RBC 2.61* 11/23/2012 0924   RBC 4.50 06/01/2008 1547   HGB 7.4* 11/23/2012 0924   HGB 10.1* 06/01/2008 1547   HCT 24.4* 11/23/2012 0924   HCT 33.0* 06/01/2008 1547   PLT 264 11/23/2012 0924   PLT 452* 06/01/2008 1547   MCV 93.5 11/23/2012 0924   MCV 73.2* 06/01/2008 1547   MCH 28.4 11/23/2012 0924   MCH 22.5* 06/01/2008 1547   MCHC 30.3 11/23/2012 0924   MCHC 30.7* 06/01/2008 1547   RDW 17.9* 11/23/2012 0924   RDW 18.4* 06/01/2008 1547   LYMPHSABS 1.1 11/16/2012 0242   LYMPHSABS 1.1 06/01/2008 1547   MONOABS 0.6 11/16/2012 0242   MONOABS 0.6 06/01/2008 1547   EOSABS 0.5 11/16/2012 0242     EOSABS 0.5 06/01/2008 1547   BASOSABS 0.0 11/16/2012 0242   BASOSABS 0.0 06/01/2008 1547    Assessment/Plan:  1. AKI/CKD multifactorial with ischemic ATN and obstructive uropathy.   UOP is cont to improve and Scr is starting to improve.  Cont to hold off on more HD. Cont to follow UOP and daily Scr 2. Right hydro s/p ureteral stent.  Cont to follow.  (now with bilateral stents) 3. Anemia worsened, suspect with bleeding. S/p transfusion with 1 unit PRBC, slight increase in Hgb.  4. Pneu on AB seems better  5. E coli sepsis on Rocephin  6. Obstructive Uropathy stent now on R will cont to follow UOP and daily Scr  7. DM stabel  8. Morbid obesity.  9. Hydrad suppurativa- has declined surgical intervention here and will likely require extensive reconstruction at [facility]  10. ? 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 Will follow Ca levels.  Starting to decrease, will add CaCO3 qhs. 11. Protein malnutrition- cont with prot supple 12. Loss of hearing- ?drug related. Consider ENT eval 13. Deconditioning- will need PT/OT and not cooperating with PT.  [place] A

## 2012-11-23 NOTE — Progress Notes (Signed)
PROGRESS NOTE  Ryonna Borck [id number] DOB: September 14, 1961 DOA: 11/10/2012 PCP: [name], MD  Brief narrative: 51 years old morbid obese female with complex PMH relevant for systolic CHF with LVEF of 35 to 40%, HTN, DM2, hypothyroidism, chronic pain on narcotics and CKD being evaluated by nephrology for dialysis. She also has a complex history of suppurative hidradenitis and is on chronic antibiotic therapy per ID. She was thought to [facility] 12/15 because she was hypoxic in respiratory distress and had altered mental status and was lethargic. She required intubation mechanical ventilation.  Past medical history-As per Problem list Chart reviewed as below-  Chronic hidradenitis + leukocytosis-seen at [facility], seen by infectious disease 01/19/12 admission   admitted 04/04/12 for symptomatic anemia hemoglobin 6.7   Presumed nonischemic cardiomyopathy last EF 35%-40% 04/06/12   Chronic kidney disease stage III, followed by nephrology baseline creatinine 2.4   Type 2 diabetes mellitus, last A1c 8.5   Acute on chronic systolic congestive heart failure-admitted 01/19/12, Lasix 60 twice a day given.   Admit 08/16/11-status post 4 units packed red blood cells   History of hypothyroidism, ? MNG   Morbid obesity   Hyperlipidemia   Gout   Evaluated 02/08/11 by plastic surgery at [facility]:  12/15 vanc>>>12/15  12/15 zosyn>>>12/17  12/16 levofloxacin>>>12/17  12/16 ceftriaxone>>>12/29 (planned)   Cultures:  12/15 BC x 2>>>E coli sens ceftr  12/15 urine>>>E coli (variable res)  12/15 tracheal >>>NF   Lines:  12/15 left IJ >>>  12/15 Rt rad>>>12/19  12/15 ETT>>>12/25  12/16 rt fem HD >>>12/25   Events/ Studeis:  12/15 >>> Arrest  12/15 renal US>>Suboptimal examination. There may be mild pelvicaliectasis on the<BR>right.<BR> <BR>Left kidney not visualized  12/16>>>ARF, shock, started on cvvhd  12/16 echo - 55% EF, mod reduced rv fxn, PA 46  12/17-  pressors better, K resolved  12/17 CT chest- bibasilr small infiltrates / atx  12/17 CT abdo/pelvis>>>Left ureteral stent placement following removal of left  ureteral calculi. No residual left-sided hydronephrosis or ureteral stone fragments. Apparent lithotripsy of the right renal calculi with obstructing 10 mm stone fragment at the right ureteral pelvic junction. There are small renal calyceal stone fragments bilaterally. Anasarca  12/18- off pressors, weaning, awake  12/18- urology called, remains off pressors, failed weaning  12/20- neg balance  12/21 >tranx [facility] for uro proced>Cystoscopy, left ureter stent removal, bilateral retrograde pyelogram, right ureter stent placement  12/24 bscopy for RUL atx     Subjective  Alert oriented hard of hearing. Can mouth read somewhat.  Nursing reports no issues currently Seems to be in good spirits. Family at bedside. No nausea no vomiting no chest pain no blurred vision no double vision Palliative medicine provider in room   Objective    Interim History: None  Telemetry: Normal sinus rhythm  Objective: Filed Vitals:   11/22/12 2124 11/23/12 0036 11/23/12 0519 11/23/12 0925  BP: 130/60  153/56 158/68  Pulse: 87 82 72 72  Temp: 98 F (36.7 C)  98.1 F (36.7 C) 99 F (37.2 C)  TempSrc: Axillary  Oral Oral  Resp: 22 20 20 20   Height:      Weight: 162.6 kg (358 lb 7.5 oz)     SpO2: 100% 100% 100% 99%    Intake/Output Summary (Last 24 hours) at 11/23/12 1501 Last data filed at 11/23/12 0400  Gross per 24 hour  Intake  631.5 ml  Output    150 ml  Net  481.5  ml    Exam:  General: Obese pleasant African American female in no apparent distress Cardiovascular: S1-S2 no murmur rub or gallop Respiratory: Clinically clear Abdomen: Soft nontender nondistended Skin hidradenitis scars over abdomen under breasts Neuro hearing is significantly impaired  Data Reviewed: Basic Metabolic Panel:  Lab [id number] 0924 11/22/12 0447  11/21/12 0445 11/20/12 0420 11/19/12 0435 11/18/12 0458 11/17/12 0455  NA 141 143 142 143 141139 -- --  K 3.8 3.7 -- -- -- -- --  CL 104 106 105 106 105104 -- --  CO2 26 28 28 29  2928 -- --  GLUCOSE 104* 120* 88 127* 131*129* -- --  BUN 62* 63* 58* 56* 46*46* -- --  CREATININE 3.92* 3.97* 3.85* 3.49* 3.17*3.11* -- --  CALCIUM 7.6* 8.0* 8.2* 8.3* 8.58.5 -- --  MG -- -- -- -- -- -- 2.3  PHOS -- 4.9* 5.2* 4.9* 4.34.3 3.3 --   Liver Function Tests:  Lab 11/22/12 0447 11/21/12 0445 11/20/12 0420 11/19/12 0435 11/18/12 0458  AST -- 10 -- 13 --  ALT -- <5 -- <5 --  ALKPHOS -- 90 -- 113 --  BILITOT -- 0.3 -- 0.3 --  PROT -- 7.0 -- 7.2 --  ALBUMIN 2.1* 2.0* 2.1* 2.1*2.1* 2.0*   No results found for this basename: LIPASE:5,AMYLASE:5 in the last 168 hours No results found for this basename: AMMONIA:5 in the last 168 hours CBC:  Lab 11/23/12 0924 11/22/12 0448 11/21/12 0445 11/20/12 0420 11/19/12 0435  WBC 10.7* 13.1* 10.8* 13.4* 11.9*  NEUTROABS -- -- -- -- --  HGB 7.4* 7.3* 6.7* 7.4* 7.3*  HCT 24.4* 24.1* 22.9* 24.9* 24.2*  MCV 93.5 93.4 95.0 94.3 93.8  PLT 264 260 263 248 227   Cardiac Enzymes: No results found for this basename: CKTOTAL:5,CKMB:5,CKMBINDEX:5,TROPONINI:5 in the last 168 hours BNP: No components found with this basename: POCBNP:5 CBG:  Lab 11/23/12 1410 11/23/12 1221 11/23/12 0817 11/22/12 2118 11/22/12 1635  GLUCAP 120* 93 107* 121* 149*    Recent Results (from the past 240 hour(s))  CULTURE, BAL-QUANTITATIVE     Status: Normal   Collection Time   11/19/12 11:40 AM      Component Value Range Status Comment   Specimen Description BRONCHIAL ALVEOLAR LAVAGE   Final    Special Requests Normal   Final    Gram Stain     Final    Value: FEW WBC PRESENT,BOTH PMN AND MONONUCLEAR     FEW SQUAMOUS EPITHELIAL CELLS PRESENT     NO ORGANISMS SEEN   Colony Count 80,000 COLONIES/ML   Final    Culture Non-Pathogenic Oropharyngeal-type Flora Isolated.   Final     Report Status 11/22/2012 FINAL   Final      Studies:              All Imaging reviewed and is as per above notation   Scheduled Meds:   . albuterol  2.5 mg Nebulization Q6H  . cefTRIAXone (ROCEPHIN)  IV  2 g Intravenous Q24H  . cinacalcet  90 mg Oral Q breakfast  . darbepoetin (ARANESP) injection - NON-DIALYSIS  200 mcg Subcutaneous Q Thu-1800  . dextrose  1 ampule Intravenous Once  . DULoxetine  60 mg Oral Daily  . insulin aspart  0-20 Units Subcutaneous TID WC  . ipratropium  0.5 mg Nebulization Q6H  . levothyroxine  12.5 mcg Intravenous Daily  . paricalcitol  2 mcg Intravenous Q M,W,F  . saccharomyces boulardii  250 mg Oral BID   Continuous  Infusions:    Assessment/Plan: 1. Acute respiratory failure with hypoxia-currently resolved. Continue BiPAP at night. She had a bronchoscopy and secretions of right upper lobe atelectasis were removed at that time on the 24th 2. Severe septic shock 2/2 Ecoli status post left ureteric stent removal, right ureter stent placement 12/21+ bilateral pneumonia bases-antibiotics were managed and weaned per CCM. She currently is getting ceftriaxone to complete 14 days therapy on 12/29 3. Acute renal failure-secondary to shock. Nephrology managing. Creatinine continues to climb. She potentially will need long-term dialysis-this discussion has been initiated but patient does vacillate. 4. Anemia-hemoglobin appears to be at baseline between 7 and 9. She was transfused once on the 19th and again on the 26th. Follow crit-she is on currentlyDarbepoetin 230mcg q week 5. Diastolic HF, EF 123456, PA Peak 45%-hold diuretics at present. Patient appears compensated right now-re-ordered Coreg 6.25 bid.  Hold home medications of Imdur 30 mg every 24 hourly ,Lasix 240 bid  6. DM ty 2-culture is acceptable 12/19/1991 range. Continue dysphagia 3 diet, Nepro Carb steady state 7. Dysphagia-continue to thicken liquids 8. Depression-continue Cymbalta 60 mg daily 9. Chronic  pain continue Percocet 5/225 every 4 when necessary, Flexeril 10 mg 3 times a day when necessary 10. Metabolic bone disease-continue pack calcitriol 2 mcg Monday Wednesday Friday, Sensipar 90 mg q. Breakfast 11. 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 May need outpatient ENT eval. Will contact on Monday for nonemergent consult. 12. Hypothyroidism-continue levothyroxine 50 mcg every morning  Code Status: Full Family Communication: Ongoing discussions occurring between is palliative care and family. Family's understanding as patient is sick but is improving. If the trend continues patient maybe a candidate for CIR. Disposition Plan: ?   Verneita Griffes, MD  [facility] Pager [phone] 11/23/2012, 3:01 PM    LOS: 13 days

## 2012-11-23 NOTE — Progress Notes (Signed)
Patient [id number] Ammann      DOB: Jul 09, 1961      [id number]   Summary of Goals of Care, full consult to follow:  Please note,  The patient has lost her hearing during this hospital stay and so all communication was written with verbal responses.  Her mother and spouse met with me .  They informed me that they have not told her everything that has happened to her yet especially about her heart and having to be resuscitated.  We talked about how important it was going to be to update her when the time is right, so that she can help make decisions for herself.  We all recognized that this is going to be a slow and difficult process.  For now the goals stand as follows:   1.  Full Code  2.  Continue aggressive treatment of infection and and renal failure.  3.  Renal failure:  The patient was considering dialysis prior to this admission.  Nephro is following.   4.  Agree with trying to get an ENT eval or opinion on return of hearing.  5.  Disposition:  Patient spent some time at [facility] in the past and told her mom she would never go their again.  She was walking and going to work prior to admission.  Will need time and encouragement to participate in rehab.  Shared this with her family and attempted to align their thoughts with realistic goals.     Total time : 135 pm- 315 pm  Marvalene Barrett L. Lovena Le, MD MBA The Palliative Medicine Team at [facility] Phone: [phone] Pager: [phone]'

## 2012-11-23 NOTE — Consult Note (Signed)
Patient [id number] [name]      DOB: Mar 07, 1961      [id number]     Consult Note from the Palliative Medicine Team at [facility] Requested by: Dr.  Elsworth [name]     PCP: Shelley Schwalbe, MD Reason for Consultation: [place]    Phone [phone] Related symptom rec Assessment of patients Current state: Patient is a 51 year old african [place] female with a known past medical history for severe hidradenitis suppurativa, involving between her breast, her buttocks and perineal area and under her arm nonischemic cardiomyopathy, and stage IV CKD pursing possible outpatient dialysis,.  She has significant morbid obesity which compounds this diagnosis.  She had been getting out to work at [employer] office despite all of the above.  She presented this admission with dyspnea and altered mental status.  She was found to  Be be septic with PNA and Ecoli sepsis, along with worsening renal failure and with nephrolithiasis.  She was intubated  And subsequent suffered a cardiac arrest.  She has been off pressors, extubated 12/25.  She has had multiple complications including the need for CVVHD, dislodgement of dialysis catheter with bleeding, acute deafness, and ureteral stent placements.  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  Her husband and mother are her primary caregivers.  They have not revealed all the events of this stay to [facility], in particular the part about her cardiac arrest as they feel that she can not handle it.  It has been particularly hard to communicate with [facility] because of the onset of acute deafness.  Her spouse states at this time that she is to remain a full code, full aggressive care status.  He is open to thinking about letting [name] help [place] make future decisions.  Goals will continue to be established as this family works thorough all these issuses.   Goals of Care: 1.  Code  Status: Full Code   2. Scope of Treatment: Continue with all curative treatments and aggressive interventions including CPR, dialysis, Vent support, antibiotics.  Family open to [facility] /rehab 4. Disposition: to be determined.  Likely will need rehab/LTACH   3. Symptom Management:   1. Anxiety/Agitation: Not an issue patient clear and able to contribute , only limitation is hearing 2. Pain: agree with flexeril , and percocet.   3. Skin Wounds: patient has declined surgical intervention. Will attempt to visualize during dressing changes.  4. Bowel Regimen: 12/26 continue to monitor, add colace if no movement soon 5. Short term memory deficits:  Continue to monitor and work with family  65.   4. Psychosocial: Patient is married without children . [name] is supportive.  Mother is also care giver and is supportive.  She works as a Solicitor  5. Spiritual:  Christian faith        Patient Documents Completed or Given: Document Given Completed  Advanced Directives Pkt    MOST    DNR    Gone from My Sight    Hard Choices      Brief HPI: 51 yr old african [place] female admitted with sepsis from [facility] source unrine with ureteral stents, pneumonia, and cardiogenic shock post arrest.  Patient is not fully apprised of her recent medical events particularly as they relate to her heart.  I have been asked to help her family and  the patient work through goals of care.   ROS:  Fatigue,  Hungry,   Deaf,  No nausea or vomiting,  Morbidly obese    PMH:  Past Medical History  Diagnosis Date  . Systolic congestive heart failure   . SOB (shortness of breath)   . HTN (hypertension)   . DM type 2 (diabetes mellitus, type 2)   . Chronic kidney disease (CKD), stage III (moderate)   . Iron deficiency anemia   . Morbid obesity   . Hyperkalemia   . Hypothyroidism   . CHF (congestive heart failure)      PSH: Past Surgical History  Procedure Date  . Portacath placement    . Cystoscopy w/ ureteral stent placement 05/28/2012    Procedure: CYSTOSCOPY WITH RETROGRADE PYELOGRAM/URETERAL STENT PLACEMENT;  Surgeon: Reece Packer, MD;  Location: [facility] ORS;  Service: Urology;  Laterality: Left;  . Cystoscopy w/ retrogrades 11/16/2012    Procedure: CYSTOSCOPY WITH RETROGRADE PYELOGRAM;  Surgeon: Reece Packer, MD;  Location: [facility]S;  Service: Urology;  Laterality: Bilateral;  CYSTOSCOPY,BILATERAL RETROGRADE PYELOGRAM/ REMOVAL LEFT URETERAL STENT/ FULGERATION BLADDER MUCOSA/ INSERTION RIGHT URETERAL STENT   I have reviewed the [place] and SH and  If appropriate update it with new information. Allergies  Allergen Reactions  . Amoxicillin-Pot Clavulanate Diarrhea  . Rosiglitazone Maleate     REACTION: swelling  . Amoxicillin Rash   Scheduled Meds:   . albuterol  2.5 mg Nebulization Q6H  . carvedilol  6.25 mg Oral BID WC  . cefTRIAXone (ROCEPHIN)  IV  2 g Intravenous Q24H  . cinacalcet  90 mg Oral Q breakfast  . darbepoetin (ARANESP) injection - NON-DIALYSIS  200 mcg Subcutaneous Q Thu-1800  . dextrose  1 ampule Intravenous Once  . DULoxetine  60 mg Oral Daily  . feeding supplement (NEPRO CARB STEADY)  237 mL Oral TID BM  . insulin aspart  0-20 Units Subcutaneous TID WC  . ipratropium  0.5 mg Nebulization Q6H  . levothyroxine  12.5 mcg Intravenous Daily  . paricalcitol  2 mcg Intravenous Q M,W,F  . saccharomyces boulardii  250 mg Oral BID   Continuous Infusions:  PRN Meds:.sodium chloride, albuterol, cyclobenzaprine, metoCLOPramide (REGLAN) injection, oxyCODONE-acetaminophen, RESOURCE THICKENUP CLEAR    BP 158/68  Pulse 72  Temp 99 F (37.2 C) (Oral)  Resp 20  Ht 5' 6.14" (1.68 m)  Wt 162.6 kg (358 lb 7.5 oz)  BMI 57.61 kg/m2  SpO2 99%   PPS: 40%   Intake/Output Summary (Last 24 hours) at 11/23/12 1523 Last data filed at 11/23/12 0400  Gross per 24 hour  Intake  631.5 ml  Output    150 ml  Net  481.5 ml   LBM: 12/26                Stool  Softner: none at present  Physical Exam:  General: Significant hearing loss, no acute distress HEENT: PERRL, EOMI , anicteric mm dry Chest:   Distant , decreased can not hear rhonchi, or rales CVS:  Distant limited by body habitus Abdomen :  Morbidly obese,  Ext: morbidly obese Neuro: hearing loss, short term memory loss,    Labs: CBC    Component Value Date/Time   WBC 10.7* 11/23/2012 0924   WBC 17.8* 06/01/2008 1547   RBC 2.61* 11/23/2012 0924   RBC 4.50 06/01/2008 1547   HGB 7.4* 11/23/2012 0924   HGB 10.1* 06/01/2008 1547   HCT 24.4* 11/23/2012 0924   HCT 33.0* 06/01/2008  1547   PLT 264 11/23/2012 0924   PLT 452* 06/01/2008 1547   MCV 93.5 11/23/2012 0924   MCV 73.2* 06/01/2008 1547   MCH 28.4 11/23/2012 0924   MCH 22.5* 06/01/2008 1547   MCHC 30.3 11/23/2012 0924   MCHC 30.7* 06/01/2008 1547   RDW 17.9* 11/23/2012 0924   RDW 18.4* 06/01/2008 1547   LYMPHSABS 1.1 11/16/2012 0242   LYMPHSABS 1.1 06/01/2008 1547   MONOABS 0.6 11/16/2012 0242   MONOABS 0.6 06/01/2008 1547   EOSABS 0.5 11/16/2012 0242   EOSABS 0.5 06/01/2008 1547   BASOSABS 0.0 11/16/2012 0242   BASOSABS 0.0 06/01/2008 1547      CMP     Component Value Date/Time   NA 141 11/23/2012 0924   K 3.8 11/23/2012 0924   CL 104 11/23/2012 0924   CO2 26 11/23/2012 0924   GLUCOSE 104* 11/23/2012 0924   BUN 62* 11/23/2012 0924   CREATININE 3.92* 11/23/2012 0924   CREATININE 1.90* 03/07/2012 1652   CALCIUM 7.6* 11/23/2012 0924   PROT 7.0 11/21/2012 0445   ALBUMIN 2.1* 11/22/2012 0447   AST 10 11/21/2012 0445   ALT <5 11/21/2012 0445   ALKPHOS 90 11/21/2012 0445   BILITOT 0.3 11/21/2012 0445   GFRNONAA 12* 11/23/2012 0924   GFRAA 14* 11/23/2012 0924    Chest Xray Reviewed/Impressions: resolving upper lobe atelectasis s/p bronch   Time In Time Out Total Time Spent with Patient Total Overall Time  145 pm  300 pm  60 min 75 min    Greater than 50%  of this time was spent counseling and coordinating care related to the  above assessment and plan.    Legacie Dillingham L. Lovena Le, MD MBA The Palliative Medicine Team at [facility] Phone: [phone] Pager: [phone]

## 2012-11-24 LAB — CBC WITH DIFFERENTIAL/PLATELET
Basophils Absolute: 0 10*3/uL (ref 0.0–0.1)
Basophils Relative: 0 % (ref 0–1)
Eosinophils Relative: 4 % (ref 0–5)
HCT: 25.3 % — ABNORMAL LOW (ref 36.0–46.0)
Lymphocytes Relative: 8 % — ABNORMAL LOW (ref 12–46)
MCHC: 30.4 g/dL (ref 30.0–36.0)
MCV: 92.3 fL (ref 78.0–100.0)
Monocytes Absolute: 0.5 10*3/uL (ref 0.1–1.0)
Neutro Abs: 10 10*3/uL — ABNORMAL HIGH (ref 1.7–7.7)
Platelets: 252 10*3/uL (ref 150–400)
RDW: 17.9 % — ABNORMAL HIGH (ref 11.5–15.5)
WBC: 11.8 10*3/uL — ABNORMAL HIGH (ref 4.0–10.5)

## 2012-11-24 LAB — RENAL FUNCTION PANEL
Albumin: 2.1 g/dL — ABNORMAL LOW (ref 3.5–5.2)
CO2: 25 mEq/L (ref 19–32)
Calcium: 7.9 mg/dL — ABNORMAL LOW (ref 8.4–10.5)
Calcium: 7.6 mg/dL — ABNORMAL LOW (ref 8.4–10.5)
Creatinine, Ser: 3.85 mg/dL — ABNORMAL HIGH (ref 0.50–1.10)
Creatinine, Ser: 3.81 mg/dL — ABNORMAL HIGH (ref 0.50–1.10)
GFR calc Af Amer: 15 mL/min — ABNORMAL LOW (ref >90)
GFR calc Af Amer: 15 mL/min — ABNORMAL LOW (ref >90)
GFR calc non Af Amer: 13 mL/min — ABNORMAL LOW (ref >90)
GFR calc non Af Amer: 13 mL/min — ABNORMAL LOW (ref >90)
Phosphorus: 4.2 mg/dL (ref 2.3–4.6)
Sodium: 139 mEq/L (ref 135–145)
Sodium: 138 mEq/L (ref 135–145)

## 2012-11-24 LAB — GLUCOSE, CAPILLARY
Glucose-Capillary: 120 mg/dL — ABNORMAL HIGH (ref 70–99)
Glucose-Capillary: 97 mg/dL (ref 70–99)
Glucose-Capillary: 108 mg/dL — ABNORMAL HIGH (ref 70–99)

## 2012-11-24 MED ORDER — CALCIUM CARBONATE 1250 (500 CA) MG PO TABS
1000.0000 mg | ORAL_TABLET | Freq: Every day | ORAL | Status: DC
  Administered 2012-11-24 – 2012-12-02 (×9): 1000 mg via ORAL
  Filled 2012-11-24 (×11): qty 2

## 2012-11-24 NOTE — Progress Notes (Signed)
Patient ID: Leandrea Scagnelli, female   DOB: 29-Jan-1961, 51 y.o.   MRN: [id number] S:no new complaints, more oriented today and realizes that she doesn't remember being in the ICU and that she missed Christmas. O:BP 132/58  Pulse 76  Temp 98 F (36.7 C) (Oral)  Resp 20  Ht 5' 6.14" (1.68 m)  Wt 162.6 kg (358 lb 7.5 oz)  BMI 57.61 kg/m2  SpO2 91%  Intake/Output Summary (Last 24 hours) at 11/24/12 1026 Last data filed at 11/24/12 0300  Gross per 24 hour  Intake     70 ml  Output   1000 ml  Net   -930 ml   Intake/Output: I/O last 3 completed shifts: In: 581.5 [P.O.:500; I.V.:27.5; IV Piggyback:54] Out: 1150 [Urine:1150]  Intake/Output this shift:    Weight change:  [id number] AAF in NAD CVS:no rub Resp:CTA [id number] edema   Lab 11/23/12 0924 11/22/12 0447 11/21/12 0445 11/20/12 0420 11/19/12 0435 11/18/12 0458  NA 141 143 142 143 141139 138  K 3.8 3.7 3.8 3.7 3.63.6 3.9  CL 104 106 105 106 105104 102  CO2 26 28 28 29  2928 27  GLUCOSE 104* 120* 88 127* 131*129* 93  BUN 62* 63* 58* 56* 46*46* 36*  CREATININE 3.92* 3.97* 3.85* 3.49* 3.17*3.11* 2.65*  ALBUMIN -- 2.1* 2.0* 2.1* 2.1*2.1* 2.0*  CALCIUM 7.6* 8.0* 8.2* 8.3* 8.58.5 8.5  PHOS -- 4.9* 5.2* 4.9* 4.34.3 3.3  AST -- -- 10 -- 13 --  ALT -- -- <5 -- <5 --   Liver Function Tests:  Lab 11/22/12 0447 11/21/12 0445 11/20/12 0420 11/19/12 0435  AST -- 10 -- 13  ALT -- <5 -- <5  ALKPHOS -- 90 -- 113  BILITOT -- 0.3 -- 0.3  PROT -- 7.0 -- 7.2  ALBUMIN 2.1* 2.0* 2.1* --   No results found for this basename: LIPASE:3,AMYLASE:3 in the last 168 hours No results found for this basename: AMMONIA:3 in the last 168 hours CBC:  Lab 11/23/12 0924 11/22/12 0448 11/21/12 0445 11/20/12 0420 11/19/12 0435  WBC 10.7* 13.1* 10.8* -- --  NEUTROABS -- -- -- -- --  HGB 7.4* 7.3* 6.7* -- --  HCT 24.4* 24.1* 22.9* -- --  MCV 93.5 93.4 95.0 94.3 93.8  PLT 264 260 263 -- --   Cardiac Enzymes: No results found for  this basename: CKTOTAL:5,CKMB:5,CKMBINDEX:5,TROPONINI:5 in the last 168 hours CBG:  Lab 11/24/12 0912 11/23/12 2130 11/23/12 1633 11/23/12 1410 11/23/12 1221  GLUCAP 120* 113* 122* 120* 93    Iron Studies: No results found for this basename: IRON,TIBC,TRANSFERRIN,FERRITIN in the last 72 hours Studies/Results: No results found.    . carvedilol  6.25 mg Oral BID WC  . cefTRIAXone (ROCEPHIN)  IV  2 g Intravenous Q24H  . cinacalcet  90 mg Oral Q breakfast  . darbepoetin (ARANESP) injection - NON-DIALYSIS  200 mcg Subcutaneous Q Thu-1800  . dextrose  1 ampule Intravenous Once  . DULoxetine  60 mg Oral Daily  . feeding supplement (NEPRO CARB STEADY)  237 mL Oral TID BM  . insulin aspart  0-20 Units Subcutaneous TID WC  . ipratropium  0.5 mg Nebulization Q6H  . levothyroxine  25 mcg Oral QAC breakfast  . paricalcitol  2 mcg Intravenous Q M,W,F  . saccharomyces boulardii  250 mg Oral BID    BMET    Component Value Date/Time   NA 141 11/23/2012 0924   K 3.8 11/23/2012 0924   CL 104 11/23/2012 0924   CO2 26  11/23/2012 0924   GLUCOSE 104* 11/23/2012 0924   BUN 62* 11/23/2012 0924   CREATININE 3.92* 11/23/2012 0924   CREATININE 1.90* 03/07/2012 1652   CALCIUM 7.6* 11/23/2012 0924   GFRNONAA 12* 11/23/2012 0924   GFRAA 14* 11/23/2012 0924   CBC    Component Value Date/Time   WBC 10.7* 11/23/2012 0924   WBC 17.8* 06/01/2008 1547   RBC 2.61* 11/23/2012 0924   RBC 4.50 06/01/2008 1547   HGB 7.4* 11/23/2012 0924   HGB 10.1* 06/01/2008 1547   HCT 24.4* 11/23/2012 0924   HCT 33.0* 06/01/2008 1547   PLT 264 11/23/2012 0924   PLT 452* 06/01/2008 1547   MCV 93.5 11/23/2012 0924   MCV 73.2* 06/01/2008 1547   MCH 28.4 11/23/2012 0924   MCH 22.5* 06/01/2008 1547   MCHC 30.3 11/23/2012 0924   MCHC 30.7* 06/01/2008 1547   RDW 17.9* 11/23/2012 0924   RDW 18.4* 06/01/2008 1547   LYMPHSABS 1.1 11/16/2012 0242   LYMPHSABS 1.1 06/01/2008 1547   MONOABS 0.6 11/16/2012 0242   MONOABS 0.6 06/01/2008 1547    EOSABS 0.5 11/16/2012 0242   EOSABS 0.5 06/01/2008 1547   BASOSABS 0.0 11/16/2012 0242   BASOSABS 0.0 06/01/2008 1547     Assessment/Plan:  1. AKI/CKD multifactorial with ischemic ATN and obstructive uropathy. UOP is cont to improve and Scr has stalled in its decline. Cont to hold off on more HD. Cont to follow UOP and daily Scr 2. Right hydro s/p ureteral stent. Cont to follow. (now with bilateral stents) 3. Anemia worsened, suspect with bleeding. S/p transfusion with 1 unit PRBC, slight increase in Hgb.  4. Pneu on AB seems better  5. E coli sepsis on Rocephin  6. Obstructive Uropathy stent now on R will cont to follow UOP and daily Scr  7. DM stabel  8. Morbid obesity.  9. Hydrad suppurativa- has declined surgical intervention here and will likely require extensive reconstruction at [facility]  10. ? 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 Will follow Ca levels. Starting to decrease, will add CaCO3 qhs. 11. Protein malnutrition- cont with prot supple 12. Loss of hearing- ?drug related. Consider ENT eval 13. Deconditioning- will need PT/OT and not cooperating with PT. 14.   Jaylun Fleener A

## 2012-11-24 NOTE — Progress Notes (Signed)
Speech Language Pathology Dysphagia Treatment Patient Details Name: [name] MRN: [id number] DOB: Jul 30, 1961 Today's Date: 11/24/2012 Time: 1345-1400 SLP Time Calculation (min): 15 min  Assessment / Plan / Recommendation Clinical Impression  Focus of diagnostic treatment to provide skilled education to patient and caregivers on recommended aspiration precautions and strategies to improve safety of the swallow.  Patient requried written cues to demonstrate understanding of recommendations due to continued hearing deficit.  ST to follow in acute care setting for diet tolerance and  possible advancement.     Diet Recommendation  Continue with Current Diet: Dysphagia 3 (mechanical soft);Thin liquid    SLP Plan Continue with current plan of care      Swallowing Goals  SLP Swallowing Goals Swallow Study Goal #2 - Progress: Progressing toward goal  General Temperature Spikes Noted: No Respiratory Status: Supplemental O2 delivered via (comment) Behavior/Cognition: Alert;Cooperative;Pleasant mood;Hard of hearing Oral Cavity - Dentition: Adequate natural dentition Patient Positioning: Upright in bed  Oral Cavity - Oral Hygiene Patient is HIGH RISK - Oral Care Protocol followed (see row info): Yes Patient is AT RISK - Oral Care Protocol followed (see row info): Yes   Dysphagia Treatment Treatment focused on: Patient/family/caregiver education Family/Caregiver Educated: spouse, father and mother Patient observed directly with PO's: No Type of cueing: Verbal;Visual Amount of cueing: Moderate   GO    Sharman Crate [place], CCC-SLP [id number] [facility] 11/24/2012, 6:41 PM

## 2012-11-24 NOTE — Progress Notes (Signed)
PROGRESS NOTE  [name] [id number] DOB: 1961/09/04 DOA: 11/10/2012 PCP: [name], MD  Brief narrative: 51 years old morbid obese female with complex PMH relevant for systolic CHF with LVEF of 35 to 40%, HTN, DM2, hypothyroidism, chronic pain on narcotics and CKD being evaluated by nephrology for dialysis. She also has a complex history of suppurative hidradenitis and is on chronic antibiotic therapy per ID. She was thought to [facility] 12/15 because she was hypoxic in respiratory distress and had altered mental status and was lethargic. She required intubation mechanical ventilation due to bradycardic arrest-please see events below  Past medical history-As per Problem list Chart reviewed as below-  Chronic hidradenitis + leukocytosis-seen at [facility], seen by infectious disease 01/19/12 admission   admitted 04/04/12 for symptomatic anemia hemoglobin 6.7   Presumed nonischemic cardiomyopathy last EF 35%-40% 04/06/12   Chronic kidney disease stage III, followed by nephrology baseline creatinine 2.4   Type 2 diabetes mellitus, last A1c 8.5   Acute on chronic systolic congestive heart failure-admitted 01/19/12, Lasix 60 twice a day given.   Admit 08/16/11-status post 4 units packed red blood cells   History of hypothyroidism, ? MNG   Morbid obesity   Hyperlipidemia   Gout   Evaluated 02/08/11 by plastic surgery at [place]:  12/15 vanc>>>12/15  12/15 zosyn>>>12/17  12/16 levofloxacin>>>12/17  12/16 ceftriaxone>>>12/29 (planned)   Cultures:  12/15 BC x 2>>>E coli sens ceftr  12/15 urine>>>E coli (variable res)  12/15 tracheal >>>NF   Lines:  12/15 left IJ >>>  12/15 Rt rad>>>12/19  12/15 ETT>>>12/25  12/16 rt fem HD >>>12/25   Events/ [place]:  12/15 >>> Arrest  12/15 renal US>>Suboptimal examination. There may be mild pelvicaliectasis on the<BR>right.<BR> <BR>Left kidney not visualized  12/16>>>ARF, shock, started on cvvhd  12/16 echo -  55% EF, mod reduced rv fxn, PA 46  12/17- pressors better, K resolved  12/17 CT chest- bibasilr small infiltrates / atx  12/17 CT abdo/pelvis>>>Left ureteral stent placement following removal of left  ureteral calculi. No residual left-sided hydronephrosis or ureteral stone fragments. Apparent lithotripsy of the right renal calculi with obstructing 10 mm stone fragment at the right ureteral pelvic junction. There are small renal calyceal stone fragments bilaterally. Anasarca  12/18- off pressors, weaning, awake  12/18- urology called, remains off pressors, failed weaning  12/20- neg balance  12/21 >tranx [facility] for uro proced>Cystoscopy, left ureter stent removal, bilateral retrograde pyelogram, right ureter stent placement  12/24 bscopy for RUL atx     Subjective  Alert-no new specific issues.  Wishes to go home.  Starting to recall some things.  Doesn't recall she needed dialysis or that her kidney were failing at one point. No other c/o-discussion carried out with patient by pen and paper   Objective    Interim History: None  Telemetry: Normal sinus rhythm  Objective: Filed Vitals:   11/23/12 2200 11/24/12 0547 11/24/12 0924 11/24/12 1304  BP:  132/58  115/71  Pulse: 82 76  75  Temp:  98 F (36.7 C)  98.7 F (37.1 C)  TempSrc:  Oral    Resp: 20 20  18   Height:      Weight:      SpO2: 95% 98% 91% 91%    Intake/Output Summary (Last 24 hours) at 11/24/12 1716 Last data filed at 11/24/12 0300  Gross per 24 hour  Intake     70 ml  Output    350 ml  Net   -  280 ml    Exam:  General: Obese pleasant African American female in no apparent distress Cardiovascular: S1-S2 no murmur rub or gallop Respiratory: Clinically clear Abdomen: Soft nontender nondistended Skin hidradenitis scars over abdomen under breasts-rest of skin not examined Neuro hearing is significantly impaired  Data Reviewed: Basic Metabolic Panel:  Lab 0000000 1234 11/23/12 0924 11/22/12 0447 11/21/12  0445 11/20/12 0420 11/19/12 0435  NA 139 141 143 142 143 --  K 3.5 3.8 -- -- -- --  CL 102 104 106 105 106 --  CO2 25 26 28 28 29  --  GLUCOSE 114* 104* 120* 88 127* --  BUN 59* 62* 63* 58* 56* --  CREATININE 3.85* 3.92* 3.97* 3.85* 3.49* --  CALCIUM 7.9* 7.6* 8.0* 8.2* 8.3* --  MG -- -- -- -- -- --  PHOS 4.2 -- 4.9* 5.2* 4.9* 4.34.3   Liver Function Tests:  Lab 11/24/12 1234 11/22/12 0447 11/21/12 0445 11/20/12 0420 11/19/12 0435  AST -- -- 10 -- 13  ALT -- -- <5 -- <5  ALKPHOS -- -- 90 -- 113  BILITOT -- -- 0.3 -- 0.3  PROT -- -- 7.0 -- 7.2  ALBUMIN 2.1* 2.1* 2.0* 2.1* 2.1*2.1*   No results found for this basename: LIPASE:5,AMYLASE:5 in the last 168 hours No results found for this basename: AMMONIA:5 in the last 168 hours CBC:  Lab 11/24/12 1234 11/23/12 0924 11/22/12 0448 11/21/12 0445 11/20/12 0420  WBC 11.8* 10.7* 13.1* 10.8* 13.4*  NEUTROABS 10.0* -- -- -- --  HGB 7.7* 7.4* 7.3* 6.7* 7.4*  HCT 25.3* 24.4* 24.1* 22.9* 24.9*  MCV 92.3 93.5 93.4 95.0 94.3  PLT 252 264 260 263 248   Cardiac Enzymes: No results found for this basename: CKTOTAL:5,CKMB:5,CKMBINDEX:5,TROPONINI:5 in the last 168 hours BNP: No components found with this basename: POCBNP:5 CBG:  Lab 11/24/12 1300 11/24/12 0912 11/23/12 2130 11/23/12 1633 11/23/12 1410  GLUCAP 120* 120* 113* 122* 120*    Recent Results (from the past 240 hour(s))  CULTURE, BAL-QUANTITATIVE     Status: Normal   Collection Time   11/19/12 11:40 AM      Component Value Range Status Comment   Specimen Description BRONCHIAL ALVEOLAR LAVAGE   Final    Special Requests Normal   Final    Gram Stain     Final    Value: FEW WBC PRESENT,BOTH PMN AND MONONUCLEAR     FEW SQUAMOUS EPITHELIAL CELLS PRESENT     NO ORGANISMS SEEN   Colony Count 80,000 COLONIES/ML   Final    Culture Non-Pathogenic Oropharyngeal-type Flora Isolated.   Final    Report Status 11/22/2012 FINAL   Final      Studies:              All Imaging reviewed  and is as per above notation   Scheduled Meds:    . calcium carbonate  1,000 mg of elemental calcium Oral QHS  . carvedilol  6.25 mg Oral BID WC  . cefTRIAXone (ROCEPHIN)  IV  2 g Intravenous Q24H  . cinacalcet  90 mg Oral Q breakfast  . darbepoetin (ARANESP) injection - NON-DIALYSIS  200 mcg Subcutaneous Q Thu-1800  . dextrose  1 ampule Intravenous Once  . DULoxetine  60 mg Oral Daily  . feeding supplement (NEPRO CARB STEADY)  237 mL Oral TID BM  . insulin aspart  0-20 Units Subcutaneous TID WC  . ipratropium  0.5 mg Nebulization Q6H  . levothyroxine  25 mcg Oral QAC breakfast  .  paricalcitol  2 mcg Intravenous Q M,W,F  . saccharomyces boulardii  250 mg Oral BID   Continuous Infusions:    Assessment/Plan: 1. Acute respiratory failure with hypoxia-currently resolved. Continue BiPAP at night. She had a bronchoscopy and secretions of right upper lobe atelectasis were removed at that time on the 24th-see below 2. Severe septic shock 2/2 Ecoli status post left ureteric stent removal, right ureter stent placement 12/21+ bilateral pneumonia bases-antibiotics were managed and weaned per CCM. She currently is getting ceftriaxone to complete 14 days therapy on 12/29 3. Acute renal failure-secondary to shock. Nephrology managing. Creatinine currently stable at around 3.0-net negative -1000 cc urine  Await resolution of Kidney function 4. Anemia-hemoglobin appears to be at baseline between 7 and 9. She was transfused once on the 19th and again on the 26th. Follow crit-she is on currently Darbepoetin 229mcg q week 5. Diastolic HF, EF 123456, PA Peak 45%-hold diuretics at present. Patient appears compensated right now-re-ordered Coreg 6.25 bid.  Hold home medications of Imdur 30 mg every 24 hourly ,Lasix 240 bid  6. DM ty 2-sugar is acceptable 120-97 range. Continue dysphagia 3 diet, Nepro Carb steady state 7. Dysphagia-continue to thicken liquids 8. Chronic hidradenitis-Might need suppressive  therapy-review wounds and skin in am 9. Depression-continue Cymbalta 60 mg daily 10. Chronic pain continue Percocet 5/225 every 4 when necessary, Flexeril 10 mg 3 times a day when necessary 11. Metabolic bone disease-continue pack calcitriol 2 mcg Monday Wednesday Friday, Sensipar 90 mg q. Breakfast 12. 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 May need outpatient ENT eval. Will contact on Monday for nonemergent consult. 13. Hypothyroidism-continue levothyroxine 50 mcg every morning  Code Status: Full Family Communication: Ongoing discussions occurring between is palliative care and family. NO family in room today Disposition Plan: ?   Verneita Griffes, MD  [facility] Pager [phone] 11/24/2012, 5:16 PM    LOS: 14 days

## 2012-11-24 NOTE — Progress Notes (Signed)
Patient [id number] Nahar      DOB: November 20, 1961      [id number]  Patient surrounded by church family will need to return at a later date.  Will communicate with spouse tomorrow to see how we can help in follow up to initial goals of care.  Girtha Kilgore L. Lovena Le, MD MBA The Palliative Medicine Team at [facility] Phone: [phone] Pager: [phone]

## 2012-11-24 NOTE — Progress Notes (Signed)
[id number] PT called RN she said she came 12/26 to see pt. To do physical therapy but patient refused to do anything.

## 2012-11-25 DIAGNOSIS — I509 Heart failure, unspecified: Secondary | ICD-10-CM

## 2012-11-25 DIAGNOSIS — R5381 Other malaise: Secondary | ICD-10-CM

## 2012-11-25 LAB — RENAL FUNCTION PANEL
Albumin: 2.2 g/dL — ABNORMAL LOW (ref 3.5–5.2)
Albumin: 2.1 g/dL — ABNORMAL LOW (ref 3.5–5.2)
BUN: 60 mg/dL — ABNORMAL HIGH (ref 6–23)
BUN: 61 mg/dL — ABNORMAL HIGH (ref 6–23)
CO2: 26 mEq/L (ref 19–32)
Calcium: 7.6 mg/dL — ABNORMAL LOW (ref 8.4–10.5)
Chloride: 103 mEq/L (ref 96–112)
Chloride: 104 mEq/L (ref 96–112)
Creatinine, Ser: 3.63 mg/dL — ABNORMAL HIGH (ref 0.50–1.10)
Creatinine, Ser: 3.79 mg/dL — ABNORMAL HIGH (ref 0.50–1.10)
GFR calc Af Amer: 15 mL/min — ABNORMAL LOW (ref >90)
GFR calc Af Amer: 16 mL/min — ABNORMAL LOW (ref >90)
GFR calc non Af Amer: 13 mL/min — ABNORMAL LOW (ref >90)
GFR calc non Af Amer: 14 mL/min — ABNORMAL LOW (ref >90)
GFR calc non Af Amer: 13 mL/min — ABNORMAL LOW (ref >90)
Phosphorus: 4.5 mg/dL (ref 2.3–4.6)
Potassium: 3.8 mEq/L (ref 3.5–5.1)
Potassium: 3.8 mEq/L (ref 3.5–5.1)
Sodium: 141 mEq/L (ref 135–145)
Sodium: 141 mEq/L (ref 135–145)

## 2012-11-25 LAB — CBC WITH DIFFERENTIAL/PLATELET
Eosinophils Relative: 4 % (ref 0–5)
Hemoglobin: 8 g/dL — ABNORMAL LOW (ref 12.0–15.0)
Hemoglobin: 7.9 g/dL — ABNORMAL LOW (ref 12.0–15.0)
Lymphocytes Relative: 10 % — ABNORMAL LOW (ref 12–46)
Lymphocytes Relative: 9 % — ABNORMAL LOW (ref 12–46)
Lymphs Abs: 1 10*3/uL (ref 0.7–4.0)
Lymphs Abs: 1 10*3/uL (ref 0.7–4.0)
MCH: 28.6 pg (ref 26.0–34.0)
MCHC: 30.4 g/dL (ref 30.0–36.0)
Monocytes Relative: 4 % (ref 3–12)
Neutro Abs: 8 10*3/uL — ABNORMAL HIGH (ref 1.7–7.7)
Neutro Abs: 9.1 10*3/uL — ABNORMAL HIGH (ref 1.7–7.7)
Neutrophils Relative %: 82 % — ABNORMAL HIGH (ref 43–77)
Platelets: 277 10*3/uL (ref 150–400)
RBC: 2.77 MIL/uL — ABNORMAL LOW (ref 3.87–5.11)
WBC: 11.1 10*3/uL — ABNORMAL HIGH (ref 4.0–10.5)

## 2012-11-25 LAB — GLUCOSE, CAPILLARY: Glucose-Capillary: 108 mg/dL — ABNORMAL HIGH (ref 70–99)

## 2012-11-25 MED ORDER — ACETAMINOPHEN 500 MG PO TABS
500.0000 mg | ORAL_TABLET | Freq: Three times a day (TID) | ORAL | Status: DC | PRN
  Filled 2012-11-25: qty 1

## 2012-11-25 MED ORDER — ONDANSETRON HCL 4 MG/2ML IJ SOLN
4.0000 mg | Freq: Four times a day (QID) | INTRAMUSCULAR | Status: DC | PRN
  Administered 2012-11-25: 4 mg via INTRAVENOUS
  Filled 2012-11-25: qty 2

## 2012-11-25 MED ORDER — BOOST / RESOURCE BREEZE PO LIQD
1.0000 | Freq: Two times a day (BID) | ORAL | Status: DC
  Administered 2012-11-25 – 2012-12-02 (×11): 1 via ORAL

## 2012-11-25 MED ORDER — PARICALCITOL 1 MCG PO CAPS
2.0000 ug | ORAL_CAPSULE | Freq: Every day | ORAL | Status: DC
  Administered 2012-11-25 – 2012-11-26 (×2): 2 ug via ORAL
  Filled 2012-11-25 (×2): qty 2

## 2012-11-25 NOTE — Progress Notes (Signed)
PROGRESS NOTE  [name] [id number] DOB: May 24, 1961 DOA: 11/10/2012 PCP: [name], MD  Brief narrative: 51 years old morbid obese female with complex PMH relevant for systolic CHF with LVEF of 35 to 40%, HTN, DM2, hypothyroidism, chronic pain on narcotics and CKD being evaluated by nephrology for dialysis. She also has a complex history of suppurative hidradenitis and is on chronic antibiotic therapy per ID. She was thought to [facility] 12/15 because she was hypoxic in respiratory distress and had altered mental status and was lethargic. She required intubation mechanical ventilation due to bradycardic arrest-please see events below  Past medical history-As per Problem list Chart reviewed as below-  Chronic hidradenitis + leukocytosis-seen at [facility], seen by infectious disease 01/19/12 admission   admitted 04/04/12 for symptomatic anemia hemoglobin 6.7   Presumed nonischemic cardiomyopathy last EF 35%-40% 04/06/12   Chronic kidney disease stage III, followed by nephrology baseline creatinine 2.4   Type 2 diabetes mellitus, last A1c 8.5   Acute on chronic systolic congestive heart failure-admitted 01/19/12, Lasix 60 twice a day given.   Admit 08/16/11-status post 4 units packed red blood cells   History of hypothyroidism, ? MNG   Morbid obesity   Hyperlipidemia   Gout   Evaluated 02/08/11 by plastic surgery at [place]:  12/15 vanc>>>12/15  12/15 zosyn>>>12/17  12/16 levofloxacin>>>12/17  12/16 ceftriaxone>>>12/29 (planned)   Cultures:  12/15 BC x 2>>>E coli sens ceftr  12/15 urine>>>E coli (variable res)  12/15 tracheal >>>NF   Lines:  12/15 left IJ >>>  12/15 Rt rad>>>12/19  12/15 ETT>>>12/25  12/16 rt fem HD >>>12/25   Events/ [place]udeis:  12/15 >>> Arrest  12/15 renal US>>Suboptimal examination. There may be mild pelvicaliectasis on the<BR>right.<BR> <BR>Left kidney not visualized  12/16>>>ARF, shock, started on cvvhd  12/16 echo -  55% EF, mod reduced rv fxn, PA 46  12/17- pressors better, K resolved  12/17 CT chest- bibasilr small infiltrates / atx  12/17 CT abdo/pelvis>>>Left ureteral stent placement following removal of left  ureteral calculi. No residual left-sided hydronephrosis or ureteral stone fragments. Apparent lithotripsy of the right renal calculi with obstructing 10 mm stone fragment at the right ureteral pelvic junction. There are small renal calyceal stone fragments bilaterally. Anasarca  12/18- off pressors, weaning, awake  12/18- urology called, remains off pressors, failed weaning  12/20- neg balance  12/21 >tranx [facility] for uro proced>Cystoscopy, left ureter stent removal, bilateral retrograde pyelogram, right ureter stent placement  12/24 bscopy for RUL atx     Subjective  Alert-no new specific issues.  Wishes to go home.  Still has some difficulty remembering things that have occurred She states she has little bit more cough today than previously. She asked for antinausea medications yesterday. She and I unable to speak by me writing letters and reviewed review of systems which reveals no chest pain no nausea no vomiting no shortness of breath no diarrhea She is not making much effort at getting up or moving and doesn't wish SNF    Objective    Interim History: None  Telemetry: Normal sinus rhythm  Objective: Filed Vitals:   11/24/12 1943 11/24/12 2110 11/25/12 0034 11/25/12 0502  BP:  121/59  142/63  Pulse:  65  71  Temp:  98.2 F (36.8 C)  98.3 F (36.8 C)  TempSrc:  Oral  Oral  Resp:  20  19  Height:      Weight:      SpO2: 95% 92% 95%  97%    Intake/Output Summary (Last 24 hours) at 11/25/12 1232 Last data filed at 11/25/12 0300  Gross per 24 hour  Intake    310 ml  Output   1200 ml  Net   -890 ml    Exam:  General: Obese pleasant African American female in no apparent distress Cardiovascular: S1-S2 no murmur rub or gallop Respiratory: Clinically clear Abdomen: Soft  nontender nondistended Skin hidradenitis scars over abdomen under breasts-she has areas in the mons pubis and bilateral intertriginous area is that have all scars of her tendinitis. On her bottom and around her anus she has areas of swelling and all scars which bleed when touched. None of these areas appear grossly infected or purulent Neuro hearing is significantly impaired  Data Reviewed: Basic Metabolic Panel:  Lab [id number] 11/25/12 0635 11/24/12 1904 11/24/12 1234 11/23/12 0924 11/22/12 0447  NA 141 141 138 139 141 --  K 3.8 3.8 -- -- -- --  CL 104 103 102 102 104 --  CO2 26 23 25 25 26  --  GLUCOSE 122* 89 113* 114* 104* --  BUN 61* 60* 60* 59* 62* --  CREATININE 3.63* 3.80* 3.81* 3.85* 3.92* --  CALCIUM 7.8* 7.9* 7.6* 7.9* 7.6* --  MG -- -- -- -- -- --  PHOS 4.5 4.5 4.2 4.2 -- 4.9*   Liver Function Tests:  Lab 11/25/12 0922 11/25/12 0635 11/24/12 1904 11/24/12 1234 11/22/12 0447 11/21/12 0445 11/19/12 0435  AST -- -- -- -- -- 10 13  ALT -- -- -- -- -- <5 <5  ALKPHOS -- -- -- -- -- 90 113  BILITOT -- -- -- -- -- 0.3 0.3  PROT -- -- -- -- -- 7.0 7.2  ALBUMIN 2.2* 2.2* 2.1* 2.1* 2.1* -- --   No results found for this basename: LIPASE:5,AMYLASE:5 in the last 168 hours No results found for this basename: AMMONIA:5 in the last 168 hours CBC:  Lab 11/25/12 0922 11/25/12 0635 11/24/12 1234 11/23/12 0924 11/22/12 0448  WBC 11.1* 9.9 11.8* 10.7* 13.1*  NEUTROABS 9.1* 8.0* 10.0* -- --  HGB 7.9* 8.0* 7.7* 7.4* 7.3*  HCT 25.8* 26.3* 25.3* 24.4* 24.1*  MCV 93.1 93.9 92.3 93.5 93.4  PLT 261 277 252 264 260   Cardiac Enzymes: No results found for this basename: CKTOTAL:5,CKMB:5,CKMBINDEX:5,TROPONINI:5 in the last 168 hours BNP: No components found with this basename: POCBNP:5 CBG:  Lab 11/25/12 1148 11/25/12 0835 11/24/12 2103 11/24/12 1724 11/24/12 1300  GLUCAP 108* 108* 108* 97 120*    Recent Results (from the past 240 hour(s))  CULTURE, BAL-QUANTITATIVE     Status:  Normal   Collection Time   11/19/12 11:40 AM      Component Value Range Status Comment   Specimen Description BRONCHIAL ALVEOLAR LAVAGE   Final    Special Requests Normal   Final    Gram Stain     Final    Value: FEW WBC PRESENT,BOTH PMN AND MONONUCLEAR     FEW SQUAMOUS EPITHELIAL CELLS PRESENT     NO ORGANISMS SEEN   Colony Count 80,000 COLONIES/ML   Final    Culture Non-Pathogenic Oropharyngeal-type Flora Isolated.   Final    Report Status 11/22/2012 FINAL   Final      Studies:              All Imaging reviewed and is as per above notation   Scheduled Meds:    . calcium carbonate  1,000 mg of elemental calcium Oral QHS  .  carvedilol  6.25 mg Oral BID WC  . cinacalcet  90 mg Oral Q breakfast  . darbepoetin (ARANESP) injection - NON-DIALYSIS  200 mcg Subcutaneous Q Thu-1800  . dextrose  1 ampule Intravenous Once  . DULoxetine  60 mg Oral Daily  . feeding supplement (NEPRO CARB STEADY)  237 mL Oral TID BM  . insulin aspart  0-20 Units Subcutaneous TID WC  . levothyroxine  25 mcg Oral QAC breakfast  . paricalcitol  2 mcg Intravenous Q M,W,F  . saccharomyces boulardii  250 mg Oral BID   Continuous Infusions:    Assessment/Plan: 1. Acute respiratory failure with hypoxia-currently resolved. Continue BiPAP at night. She had a bronchoscopy and secretions of right upper lobe atelectasis were removed at that time on the 24th-see below 2. Severe septic shock 2/2 Ecoli status post left ureteric stent removal, right ureter stent placement 12/21+ bilateral pneumonia bases-antibiotics were managed and weaned per CCM. She currently is getting ceftriaxone to complete 14 days therapy on 12/29 3. Leukocytosis-chronic and thought probably to be secondary to her hidradenitis.  Will follow and review carefully. I would get a chest x-ray more morning to make sure nothing else is developed 4. Acute renal failure-secondary to shock. Nephrology managing. Creatinine currently stable at around 3.0 and  this has plateaud-net negative -0.89 l urine  5. Anemia-multifactorial-could be 2/2 to periods vs chronic losses with Hidradenitis.  hemoglobin appears to be at baseline between 7 and 9. She was transfused once on the 19th and again on the 26th. Follow crit-she is on currently Darbepoetin 200 mcg q week 6. Diastolic HF, EF 123456, PA Peak 45%-hold diuretics at present. Patient appears compensated right now-re-ordered Coreg 6.25 bid.  Hold home medications of Imdur 30 mg every 24 hourly ,Lasix 240 bid  7. DM ty 2-sugar is acceptable 108-122  range. Continue dysphagia 3 diet, Nepro Carb steady state 8. Dysphagia-continue to thicken liquids 9. Chronic hidradenitis-Might need suppressive therapy-wounds appear non infected at present 10. Depression-continue Cymbalta 60 mg daily 11. Chronic pain continue Percocet 5/225 every 4 when necessary, Flexeril 10 mg 3 times a day when necessary 12. Metabolic bone disease-continue pack calcitriol 2 mcg Monday Wednesday Friday, Sensipar 90 mg q. Breakfast 13. 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 May need outpatient ENT eval. Have called ENT for recommendations 14. Hypothyroidism-continue levothyroxine 50 mcg every morning  Code Status: Full Family Communication: Ongoing discussions occurring between is palliative care and family. D/w mother in room.  She potentially will need SNF vs LTAC.  Palliative discussions on-going.  Review in am with labs Disposition Plan: ?   Verneita Griffes, MD  [facility] Pager [phone] 11/25/2012, 12:32 PM    LOS: 15 days

## 2012-11-25 NOTE — Progress Notes (Signed)
Utilization review completed.  

## 2012-11-25 NOTE — Progress Notes (Signed)
[facility]  Subjective:  Awake, was taking nap, mother at bedside   Objective: Vital signs in last 24 hours: Blood pressure 150/75, pulse 72, temperature 98.6 F (37 C), temperature source Oral, resp. rate 20, height 5' 6.14" (1.68 m), weight 162.6 kg (358 lb 7.5 oz), SpO2 98.00%.    PHYSICAL EXAM General--as above Chest--cflear Heart--no rub Abd--corpulent, nontender Extr--trace edema  I/O yesterday--310/1200  Lab Results:   Lab 11/25/12 0922 11/25/12 0635 11/24/12 1904  NA 141 141 138  K 3.8 3.8 3.4*  CL 104 103 102  CO2 26 23 25   BUN 61* 60* 60*  CREATININE 3.63* 3.80* 3.81*  ALB -- -- --  GLUCOSE 122* -- --  CALCIUM 7.8* 7.9* 7.6*  PHOS 4.5 4.5 4.2     Basename 11/25/12 0922 11/25/12 0635  WBC 11.1* 9.9  HGB 7.9* 8.0*  HCT 25.8* 26.3*  PLT 261 277   I have reviewed the patient's current medications.  Assessment/Plan: 1.  AKI supperimposed on CKD (baseline Cr 2.04 on 05 Sep 2012).  Cr was 8.98 on admission.  Cr starting to fall now 2.  Obstructed R ureter--s/p removal of L ureteral stent, removal of R Renal stone, and placement of R ureteral stent by Dr. Jasmine December on 16 Nov 2012. 3.   Anemia--received feraheme 510 x 2 last week.  On aranesp 200/wk 4.  Pneumonia--better on  Ceftriaxone 5.  E coli bacteremia- "   "   " 6.  DM-2--per primary service 7.   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  On sensipar, po CaCO3, and IV paracalcitol.  Will change to po paracalcitol 8.  Hearing loss--I'd suggest ENT to see   LOS: 15 days   Cardale Dorer F 11/25/2012,1:58 PM   .labalb

## 2012-11-25 NOTE — Consult Note (Signed)
Physical Medicine and Rehabilitation Consult Reason for Consult: Deconditioning Referring Physician:  Dr. Verlon Au   HPI: [name] is a 51 y.o. female known past medical history of morbid obesity, severe hidradenitis suppurativa-on chronic antibiotics, nonichemic cardiomyopathy, and stage IV CKD. She had been getting out to work at [employer] office despite all of the above. She was admitted on 11/10/12 with dyspnea and altered mental status. She was found to be be uroseptic with PNA  with worsening renal failure and with nephrolithiasis. She was intubated And subsequent suffered a cardiac arrest. Left ureter stent removed and right ureter stent placed by Dr. Jasmine December on 12/21.  She has had multiple complications including the need for CVVHD, dislodgement of dialysis catheter with bleeding as well as acute deafness. Patient self extubated on 12/25 and tolerating BIPAP at nights.  Patient has lesions draining pus and has refused treatment in the past. [facility] consulted and patient/family declined to have anything done therefore they have signed off.  Acute renal failure improving and HD on hold. PT/OT evaluations done and CIR recommended.     Past Medical History  Diagnosis Date  . Systolic congestive heart failure   . SOB (shortness of breath)   . HTN (hypertension)   . DM type 2 (diabetes mellitus, type 2)   . Chronic kidney disease (CKD), stage III (moderate)   . Iron deficiency anemia   . Morbid obesity   . Hyperkalemia   . Hypothyroidism   . CHF (congestive heart failure)    Past Surgical History  Procedure Date  . Portacath placement   . Cystoscopy w/ ureteral stent placement 05/28/2012    Procedure: CYSTOSCOPY WITH RETROGRADE PYELOGRAM/URETERAL STENT PLACEMENT;  Surgeon: Reece Packer, MD;  Location: [facility];  Service: Urology;  Laterality: Left;  . Cystoscopy w/ retrogrades 11/16/2012    Procedure: CYSTOSCOPY WITH RETROGRADE PYELOGRAM;  Surgeon: Reece Packer, MD;  Location:  [facility];  Service: Urology;  Laterality: Bilateral;  CYSTOSCOPY,BILATERAL RETROGRADE PYELOGRAM/ REMOVAL LEFT URETERAL STENT/ FULGERATION BLADDER MUCOSA/ INSERTION RIGHT URETERAL STENT   Family History  Problem Relation Age of Onset  . Coronary artery disease Mother   . Hypertension Mother   . Diabetes type II Mother   . Malignant hyperthermia Mother   . Coronary artery disease Father   . Hypertension Father   . Malignant hyperthermia Father   . Cancer Maternal Grandfather     ? Type  . Kidney failure Maternal Grandmother    Social History:  Married. reports that she has never smoked. She has never used smokeless tobacco. She reports that she does not drink alcohol or use illicit drugs.  Allergies  Allergen Reactions  . Amoxicillin-Pot Clavulanate Diarrhea  . Rosiglitazone Maleate     REACTION: swelling  . Amoxicillin Rash   Medications Prior to Admission  Medication Sig Dispense Refill  . aspirin EC 81 MG EC tablet Take 1 tablet (81 mg total) by mouth daily.  30 tablet  3  . carvedilol (COREG) 6.25 MG tablet Take 6.25 mg by mouth 2 (two) times daily with a meal.      . cyclobenzaprine (FLEXERIL) 10 MG tablet Take 10 mg by mouth 3 (three) times daily as needed. For Spasms      . DULoxetine (CYMBALTA) 60 MG capsule Take 60 mg by mouth daily.        . ferrous sulfate 325 (65 FE) MG tablet Take 325 mg by mouth 2 (two) times daily.        Marland Kitchen  folic acid (FOLVITE) 1 MG tablet Take 1 mg by mouth daily.      . furosemide (LASIX) 80 MG tablet Take 240 mg by mouth 2 (two) times daily.      . isosorbide mononitrate (IMDUR) 30 MG 24 hr tablet Take 30 mg by mouth daily.      Marland Kitchen levothyroxine (SYNTHROID, LEVOTHROID) 25 MCG tablet Take 25 mcg by mouth every morning. Take on an empty stomach      . magnesium oxide (MAG-OX) 400 MG tablet Take 400 mg by mouth 2 (two) times daily.       Marland Kitchen oxyCODONE-acetaminophen (PERCOCET) 5-325 MG per tablet Take 1 tablet by mouth every 6 (six) hours as needed. For  pain      . pravastatin (PRAVACHOL) 40 MG tablet Take 40 mg by mouth daily.        Marland Kitchen saccharomyces boulardii (FLORASTOR) 250 MG capsule Take 1 capsule (250 mg total) by mouth 2 (two) times daily.  30 capsule  0  . sevelamer (RENVELA) 800 MG tablet Take 3 tablets (2,400 mg total) by mouth 3 (three) times daily with meals.  180 tablet  0  . sodium bicarbonate 650 MG tablet Take 2 tablets (1,300 mg total) by mouth 3 (three) times daily.  120 tablet  0  . sodium hypochlorite (DAKIN'S 1/4 STRENGTH) 0.125 % SOLN Irrigate with 1 application as directed daily as needed. Apply to wound with dressing changes      . vitamin C (ASCORBIC ACID) 500 MG tablet Take 500 mg by mouth daily.      Marland Kitchen zinc sulfate 220 MG capsule Take 220 mg by mouth daily.        Home: Home Living Lives With: Spouse Available Help at Discharge: Family;Available PRN/intermittently (husband works during the day) Type of Home: House Home Access: Stairs to enter Technical brewer of Steps: 2 Entrance Stairs-Rails: Left Home Layout: One level (Pt. 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 Status:  Mobility: Bed Mobility Bed Mobility: Supine to Sit;Sitting - Scoot to Edge of Bed;Sit to Supine Supine to Sit: 1: +2 Total assist;With rails Supine to Sit: Patient Percentage: 20% Sitting - Scoot to Edge of Bed: 1: +1 Total assist Sit to Supine: 1: +2 Total assist Sit to Supine: Patient Percentage: 30% Transfers Transfers: Sit to Stand;Stand to Sit Sit to Stand: 1: +2 Total assist;From bed;From elevated surface;With upper extremity assist Sit to Stand: Patient Percentage: 20% Stand to Sit: 1: +2 Total assist;With upper extremity assist;To bed;To elevated surface Stand to Sit: Patient Percentage:  20% Ambulation/Gait Ambulation/Gait Assistance: Not tested (comment)    ADL: ADL Eating/Feeding: Supervision/safety Where Assessed - Eating/Feeding: Bed level Grooming: Wash/dry hands;Wash/dry face;Teeth care;Supervision/safety Where Assessed - Grooming: Supported sitting Upper Body Bathing: Moderate assistance Where Assessed - Upper Body Bathing: Unsupported sitting Lower Body Bathing: +2 Total assistance Where Assessed - Lower Body Bathing: Rolling right and/or left;Supine, head of bed up Upper Body Dressing: Maximal assistance Where Assessed - Upper Body Dressing: Unsupported sitting Lower Body Dressing: +1 Total assistance Where Assessed - Lower Body Dressing: Supine, head of bed up;Rolling right and/or left Toilet Transfer: +1 Total assistance (pt unable) Tub/Shower Transfer: +1 Total assistance (pt unable) Transfers/Ambulation Related to ADLs: Attempted sit to stand, only cleared buttocks ~2" from bed.  Unable to achieve standing  ADL Comments: Pt. able  to assist with UB ADLs.  Pt. unable to achieve standing for LB ADLs  Cognition: Cognition Arousal/Alertness: Awake/alert Orientation Level: Oriented X4 Cognition Overall Cognitive Status: Impaired Area of Impairment: Memory Arousal/Alertness: Awake/alert Orientation Level: Disoriented to;Situation Behavior During Session: WFL for tasks performed Memory Deficits: Pt asking the same questions multiple times  Blood pressure 142/63, pulse 71, temperature 98.3 F (36.8 C), temperature source Oral, resp. rate 19, height 5' 6.14" (1.68 m), weight 162.6 kg (358 lb 7.5 oz), SpO2 97.00%. @PHYSEXAMBYAGE2 @  Results for orders placed during the hospital encounter of 11/10/12 (from the past 24 hour(s))  CBC WITH DIFFERENTIAL     Status: Abnormal   Collection Time   11/24/12 12:34 PM      Component Value Range   WBC 11.8 (*) 4.0 - 10.5 K/uL   RBC 2.74 (*) 3.87 - 5.11 MIL/uL   Hemoglobin 7.7 (*) 12.0 - 15.0 g/dL   HCT 25.3 (*)  36.0 - 46.0 %   MCV 92.3  78.0 - 100.0 fL   MCH 28.1  26.0 - 34.0 pg   MCHC 30.4  30.0 - 36.0 g/dL   RDW 17.9 (*) 11.5 - 15.5 %   Platelets 252  150 - 400 K/uL   Neutrophils Relative 84 (*) 43 - 77 %   Neutro Abs 10.0 (*) 1.7 - 7.7 K/uL   Lymphocytes Relative 8 (*) 12 - 46 %   Lymphs Abs 1.0  0.7 - 4.0 K/uL   Monocytes Relative 4  3 - 12 %   Monocytes Absolute 0.5  0.1 - 1.0 K/uL   Eosinophils Relative 4  0 - 5 %   Eosinophils Absolute 0.4  0.0 - 0.7 K/uL   Basophils Relative 0  0 - 1 %   Basophils Absolute 0.0  0.0 - 0.1 K/uL  RENAL FUNCTION PANEL     Status: Abnormal   Collection Time   11/24/12 12:34 PM      Component Value Range   Sodium 139  135 - 145 mEq/L   Potassium 3.5  3.5 - 5.1 mEq/L   Chloride 102  96 - 112 mEq/L   CO2 25  19 - 32 mEq/L   Glucose, Bld 114 (*) 70 - 99 mg/dL   BUN 59 (*) 6 - 23 mg/dL   Creatinine, Ser 3.85 (*) 0.50 - 1.10 mg/dL   Calcium 7.9 (*) 8.4 - 10.5 mg/dL   Phosphorus 4.2  2.3 - 4.6 mg/dL   Albumin 2.1 (*) 3.5 - 5.2 g/dL   GFR calc non Af Amer 13 (*) >90 mL/min   GFR calc Af Amer 15 (*) >90 mL/min  GLUCOSE, CAPILLARY     Status: Abnormal   Collection Time   11/24/12  1:00 PM      Component Value Range   Glucose-Capillary 120 (*) 70 - 99 mg/dL  GLUCOSE, CAPILLARY     Status: Normal   Collection Time   11/24/12  5:24 PM      Component Value Range   Glucose-Capillary 97  70 - 99 mg/dL  RENAL FUNCTION PANEL     Status: Abnormal   Collection Time   11/24/12  7:04 PM      Component Value Range   Sodium 138  135 - 145 mEq/L   Potassium 3.4 (*) 3.5 - 5.1 mEq/L   Chloride 102  96 - 112 mEq/L   CO2 25  19 - 32 mEq/L   Glucose, Bld 113 (*) 70 - 99 mg/dL   BUN  60 (*) 6 - 23 mg/dL   Creatinine, Ser 3.81 (*) 0.50 - 1.10 mg/dL   Calcium 7.6 (*) 8.4 - 10.5 mg/dL   Phosphorus 4.2  2.3 - 4.6 mg/dL   Albumin 2.1 (*) 3.5 - 5.2 g/dL   GFR calc non Af Amer 13 (*) >90 mL/min   GFR calc Af Amer 15 (*) >90 mL/min  GLUCOSE, CAPILLARY     Status:  Abnormal   Collection Time   11/24/12  9:03 PM      Component Value Range   Glucose-Capillary 108 (*) 70 - 99 mg/dL  CBC WITH DIFFERENTIAL     Status: Abnormal   Collection Time   11/25/12  6:35 AM      Component Value Range   WBC 9.9  4.0 - 10.5 K/uL   RBC 2.80 (*) 3.87 - 5.11 MIL/uL   Hemoglobin 8.0 (*) 12.0 - 15.0 g/dL   HCT 26.3 (*) 36.0 - 46.0 %   MCV 93.9  78.0 - 100.0 fL   MCH 28.6  26.0 - 34.0 pg   MCHC 30.4  30.0 - 36.0 g/dL   RDW 17.8 (*) 11.5 - 15.5 %   Platelets 277  150 - 400 K/uL   Neutrophils Relative 81 (*) 43 - 77 %   Neutro Abs 8.0 (*) 1.7 - 7.7 K/uL   Lymphocytes Relative 10 (*) 12 - 46 %   Lymphs Abs 1.0  0.7 - 4.0 K/uL   Monocytes Relative 5  3 - 12 %   Monocytes Absolute 0.5  0.1 - 1.0 K/uL   Eosinophils Relative 4  0 - 5 %   Eosinophils Absolute 0.4  0.0 - 0.7 K/uL   Basophils Relative 0  0 - 1 %   Basophils Absolute 0.0  0.0 - 0.1 K/uL  GLUCOSE, CAPILLARY     Status: Abnormal   Collection Time   11/25/12  8:35 AM      Component Value Range   Glucose-Capillary 108 (*) 70 - 99 mg/dL   Comment 1 Notify RN     Comment 2 Documented in Chart     No results found.  Assessment/Plan: Diagnosis: deconditioning after multiple medical, morbid obesity 1. Does the need for close, 24 hr/day medical supervision in concert with the patient's rehab needs make it unreasonable for this patient to be served in a less intensive setting? No and Potentially 2. Co-Morbidities requiring supervision/potential complications: chfn htn dm, ckd 3. Due to bladder management, bowel management, safety, skin/wound care, disease management, medication administration, pain management and patient education, does the patient require 24 hr/day rehab nursing? Potentially 4. Does the patient require coordinated care of a physician, rehab nurse, PT (1-2 hrs/day, 5 days/week) and OT (1-2\ hrs/day, 5 days/week) to address physical and functional deficits in the context of the above medical  diagnosis(es)? Potentially Addressing deficits in the following areas: balance, endurance, locomotion, strength, transferring, bowel/bladder control, bathing, dressing, feeding, grooming, toileting and psychosocial support 5. Can the patient actively participate in an intensive therapy program of at least 3 hrs of therapy per day at least 5 days per week? Potentially 6. The potential for patient to make measurable gains while on inpatient rehab is fair 7. Anticipated functional outcomes upon discharge from inpatient rehab are supervision to minimal assist with PT, minimal to moderate assist with OT, n/a with SLP. 8. Estimated rehab length of stay to reach the above functional goals is: ?TBD 9. Does the patient have adequate social supports to accommodate these  discharge functional goals? No and Potentially 10. Anticipated D/C setting: Home 11. Anticipated post D/C treatments: [place] therapy 12. Overall Rehab/Functional Prognosis: fair  RECOMMENDATIONS: This patient's condition is appropriate for continued rehabilitative care in the following setting: SNF Patient has agreed to participate in recommended program. Potentially Note that insurance prior authorization may be required for reimbursement for recommended care.  Comment: Pt has got to demonstrate that she is willing to participate in therapy. I am not sure that she would tolerate CIR nor do I think that her family would be able to manage her even after a CIR admit given that her mother apparently would be the one who would provide assistance. Rehab RN to follow up.   Oval Linsey, MD     11/25/2012

## 2012-11-25 NOTE — Progress Notes (Signed)
Met with patient and her mother at bedside to evaluate for CIR. Pt is unable to hear, but was able to lip read and speak for herself. Noted that patient has not been able to fully participate in therapies, has not been able to bear weight since admission due to extreme weakness/deconditioning. At this point, pt would not be able to tolerate the intensity of inpatient rehab. Pt will need a longer term and gradually increasing course of rehabilitation. I am in agreement with Dr Naaman Plummer in recommending SNF prior to going home. Pt states she does not want to go to SNF and wants to go home. Pt's bedroom and bath are upstairs. [name] states that she would not be able to provide care for pt until she is ambulatory. Talked with pt's CSW re: SNF placement. Also noted that Palliative care is working with pt and family on goals of care and psychosocial support.  For questions, please call [phone].

## 2012-11-25 NOTE — Progress Notes (Signed)
Placed patient on BIPAP 12/6 with oxygen set at 2lpm

## 2012-11-25 NOTE — Progress Notes (Signed)
Occupational Therapy Treatment Patient Details Name: [name] MRN: [id number] DOB: [birth year] Today's Date: 11/25/2012 Time: [id number] OT Time Calculation (min): 35 min  OT Assessment / Plan / Recommendation Comments on Treatment Session Pt with limited participation in therapy today.  Once in sitting position only able to tolerate for approximately 5 mins before requesting to lay down, secondary to increased right hip pain.   Attempted to motivate pt to try as hard as she could but as session went on she became more upset.  Currently feel pt would not be able to tolerate 3 hours of comprehensive therapy at this time.  Unable to attempt any standing this session.     Follow Up Recommendations  SNF       Equipment Recommendations  None recommended by OT       Frequency Min 2X/week   Plan Discharge plan needs to be updated    Precautions / Restrictions Precautions Precautions: Fall Restrictions Weight Bearing Restrictions: No   Pertinent Vitals/Pain Pain in right buttocks, more severe in sitting to an intolerable level, pt repositioned in bed.    ADL  Transfers/Ambulation Related to ADLs: Did not attempt transfer today. ADL Comments: Pt required total assist +2 (pt 20%) for transfer from supine to sit EOB.  Needed max assist to maintain static sitting balance with bilateral UE support.  Pt was only able to maintain sitting EOB for approximately 5 mins before returning to supine.  Attempted to encourage pt to tolerate sitting for a longer period however she became more and more upset at the pain in her right hip.  Attempted to reposition hips to help relieve pain but did not help.  Pt became more upset and tearful at therapist trying to keep her up in sitting.  Her mother was also present and able to provide encouragement as much as possible.  Needed total assist to return back to supine and reposition in bed.      OT Goals ADL Goals ADL Goal: Lower Body Bathing - Progress: Not  progressing ADL Goal: Upper Body Dressing - Progress: Not progressing ADL Goal: Lower Body Dressing - Progress: Not progressing ADL Goal: Toilet Transfer - Progress: Not progressing ADL Goal: Toileting - Clothing Manipulation - Progress: Not progressing  Visit Information  Last OT Received On: 11/25/12 Assistance Needed: +2    Subjective Data  Subjective: I wish I would have know you were coming. Patient Stated Goal: Pt did not state      Cognition  Overall Cognitive Status: Impaired Area of Impairment: Memory Arousal/Alertness: Other (comment) (Pt asleep initially.) Behavior During Session: Other (comment) (after sitting EOB)    Mobility  Shoulder Instructions Bed Mobility Bed Mobility: Supine to Sit Supine to Sit: 1: +2 Total assist Supine to Sit: Patient Percentage: 20% Sitting - Scoot to Edge of Bed: 1: +1 Total assist Sit to Supine: 1: +2 Total assist Sit to Supine: Patient Percentage: 20% Details for Bed Mobility Assistance: Pt able to move LEs for bed mobility with mod assist but needed total assist +2 (pt20%) for transitioning her UB into sitting.          Balance Balance Balance Assessed: Yes Static Sitting Balance Static Sitting - Level of Assistance: 2: Max assist Static Sitting - Comment/# of Minutes: Five minutes in sitting, pt unable to let go with UEs secondary to pain.   End of Session OT - End of Session Activity Tolerance: Patient limited by pain Patient left: in bed;with call bell/phone within reach;with family/visitor present  Nurse Communication: Mobility status    Ailanie Ruttan OTR/L Pager number [phone] 11/25/2012, 2:58 PM

## 2012-11-25 NOTE — Progress Notes (Signed)
Patient c/o [name] notified.Order received and medicine given per order.Will continue to monitor. Raelynn Corron Joselita,RN

## 2012-11-25 NOTE — Progress Notes (Signed)
PT Cancellation Note  Patient Details Name: [name] MRN: [id number] DOB: May 08, 1961   Cancelled Treatment:    Reason Eval/Treat Not Completed: Fatigue/lethargy limiting ability to participate. Patient reports OT just worked with her. Patient politely refused PT today.  Will return tomorrow.   Despina Pole 11/25/2012, 3:19 PM [phone]

## 2012-11-25 NOTE — Progress Notes (Signed)
Palliative Medicine Team SW Pt discussed in PMT Rounds, referred for psychosocial support. Pt occupied on two attempts to see, will follow up.   Jim Like, [place] Pager [phone] PMT phone [phone]

## 2012-11-25 NOTE — Consult Note (Signed)
[name], [name] 51 y.o., female [name]     Chief Complaint: hearing difficulty  HPI: 51 yo bf hospitalized with complex medical situations.  Underwent cardiac arrest.  Somewhere during hospitalization noted to be very hard of hearing.  Reportedly on chronic antibiotic therapy for hidradenitis.  Has CRF and required hemodialysis.  Received Lasix, dose unknown.  No aminoglycosides noted on chart.  She claims she is lip reading, but cannot tell me how or when she learned this skill.  She claims her ears are stopped up "like in an airplane".  She has had a similar occurrence in the past, but does not recall specifically when.  She claims it resolved without therapy.  She recalls having hearing testing in the distant past.  No ear pain or drainage.  No history of wax accumulations.  PMH: Past Medical History  Diagnosis Date  . Systolic congestive heart failure   . SOB (shortness of breath)   . HTN (hypertension)   . DM type 2 (diabetes mellitus, type 2)   . Chronic kidney disease (CKD), stage III (moderate)   . Iron deficiency anemia   . Morbid obesity   . Hyperkalemia   . Hypothyroidism   . CHF (congestive heart failure)     Surg Hx: Past Surgical History  Procedure Date  . Portacath placement   . Cystoscopy w/ ureteral stent placement 05/28/2012    Procedure: CYSTOSCOPY WITH RETROGRADE PYELOGRAM/URETERAL STENT PLACEMENT;  Surgeon: Reece Packer, MD;  Location: [facility];  Service: Urology;  Laterality: Left;  . Cystoscopy w/ retrogrades 11/16/2012    Procedure: CYSTOSCOPY WITH RETROGRADE PYELOGRAM;  Surgeon: Reece Packer, MD;  Location: [facility];  Service: Urology;  Laterality: Bilateral;  CYSTOSCOPY,BILATERAL RETROGRADE PYELOGRAM/ REMOVAL LEFT URETERAL STENT/ FULGERATION BLADDER MUCOSA/ INSERTION RIGHT URETERAL STENT    FHx:   Family History  Problem Relation Age of Onset  . Coronary artery disease Mother   . Hypertension Mother   . Diabetes type II Mother   . Malignant  hyperthermia Mother   . Coronary artery disease Father   . Hypertension Father   . Malignant hyperthermia Father   . Cancer Maternal Grandfather     ? Type  . Kidney failure Maternal Grandmother    SocHx:  reports that she has never smoked. She has never used smokeless tobacco. She reports that she does not drink alcohol or use illicit drugs.  ALLERGIES:  Allergies  Allergen Reactions  . Amoxicillin-Pot Clavulanate Diarrhea  . Rosiglitazone Maleate     REACTION: swelling  . Amoxicillin Rash    Medications Prior to Admission  Medication Sig Dispense Refill  . aspirin EC 81 MG EC tablet Take 1 tablet (81 mg total) by mouth daily.  30 tablet  3  . carvedilol (COREG) 6.25 MG tablet Take 6.25 mg by mouth 2 (two) times daily with a meal.      . cyclobenzaprine (FLEXERIL) 10 MG tablet Take 10 mg by mouth 3 (three) times daily as needed. For Spasms      . DULoxetine (CYMBALTA) 60 MG capsule Take 60 mg by mouth daily.        . ferrous sulfate 325 (65 FE) MG tablet Take 325 mg by mouth 2 (two) times daily.        . folic acid (FOLVITE) 1 MG tablet Take 1 mg by mouth daily.      . furosemide (LASIX) 80 MG tablet Take 240 mg by mouth 2 (two) times daily.      Marland Kitchen  isosorbide mononitrate (IMDUR) 30 MG 24 hr tablet Take 30 mg by mouth daily.      Marland Kitchen levothyroxine (SYNTHROID, LEVOTHROID) 25 MCG tablet Take 25 mcg by mouth every morning. Take on an empty stomach      . magnesium oxide (MAG-OX) 400 MG tablet Take 400 mg by mouth 2 (two) times daily.       Marland Kitchen oxyCODONE-acetaminophen (PERCOCET) 5-325 MG per tablet Take 1 tablet by mouth every 6 (six) hours as needed. For pain      . pravastatin (PRAVACHOL) 40 MG tablet Take 40 mg by mouth daily.        Marland Kitchen saccharomyces boulardii (FLORASTOR) 250 MG capsule Take 1 capsule (250 mg total) by mouth 2 (two) times daily.  30 capsule  0  . sevelamer (RENVELA) 800 MG tablet Take 3 tablets (2,400 mg total) by mouth 3 (three) times daily with meals.  180 tablet  0  .  sodium bicarbonate 650 MG tablet Take 2 tablets (1,300 mg total) by mouth 3 (three) times daily.  120 tablet  0  . sodium hypochlorite (DAKIN'S 1/4 STRENGTH) 0.125 % SOLN Irrigate with 1 application as directed daily as needed. Apply to wound with dressing changes      . vitamin C (ASCORBIC ACID) 500 MG tablet Take 500 mg by mouth daily.      Marland Kitchen zinc sulfate 220 MG capsule Take 220 mg by mouth daily.        Results for orders placed during the hospital encounter of 11/10/12 (from the past 48 hour(s))  GLUCOSE, CAPILLARY     Status: Abnormal   Collection Time   11/23/12  9:30 PM      Component Value Range Comment   Glucose-Capillary 113 (*) 70 - 99 mg/dL   GLUCOSE, CAPILLARY     Status: Abnormal   Collection Time   11/24/12  9:12 AM      Component Value Range Comment   Glucose-Capillary 120 (*) 70 - 99 mg/dL   CBC WITH DIFFERENTIAL     Status: Abnormal   Collection Time   11/24/12 12:34 PM      Component Value Range Comment   WBC 11.8 (*) 4.0 - 10.5 K/uL    RBC 2.74 (*) 3.87 - 5.11 MIL/uL    Hemoglobin 7.7 (*) 12.0 - 15.0 g/dL    HCT 25.3 (*) 36.0 - 46.0 %    MCV 92.3  78.0 - 100.0 fL    MCH 28.1  26.0 - 34.0 pg    MCHC 30.4  30.0 - 36.0 g/dL    RDW 17.9 (*) 11.5 - 15.5 %    Platelets 252  150 - 400 K/uL    Neutrophils Relative 84 (*) 43 - 77 %    Neutro Abs 10.0 (*) 1.7 - 7.7 K/uL    Lymphocytes Relative 8 (*) 12 - 46 %    Lymphs Abs 1.0  0.7 - 4.0 K/uL    Monocytes Relative 4  3 - 12 %    Monocytes Absolute 0.5  0.1 - 1.0 K/uL    Eosinophils Relative 4  0 - 5 %    Eosinophils Absolute 0.4  0.0 - 0.7 K/uL    Basophils Relative 0  0 - 1 %    Basophils Absolute 0.0  0.0 - 0.1 K/uL   RENAL FUNCTION PANEL     Status: Abnormal   Collection Time   11/24/12 12:34 PM      Component Value Range Comment  Sodium 139  135 - 145 mEq/L    Potassium 3.5  3.5 - 5.1 mEq/L    Chloride 102  96 - 112 mEq/L    CO2 25  19 - 32 mEq/L    Glucose, Bld 114 (*) 70 - 99 mg/dL    BUN 59 (*) 6 -  23 mg/dL    Creatinine, Ser 3.85 (*) 0.50 - 1.10 mg/dL    Calcium 7.9 (*) 8.4 - 10.5 mg/dL    Phosphorus 4.2  2.3 - 4.6 mg/dL    Albumin 2.1 (*) 3.5 - 5.2 g/dL    GFR calc non Af Amer 13 (*) >90 mL/min    GFR calc Af Amer 15 (*) >90 mL/min   GLUCOSE, CAPILLARY     Status: Abnormal   Collection Time   11/24/12  1:00 PM      Component Value Range Comment   Glucose-Capillary 120 (*) 70 - 99 mg/dL   GLUCOSE, CAPILLARY     Status: Normal   Collection Time   11/24/12  5:24 PM      Component Value Range Comment   Glucose-Capillary 97  70 - 99 mg/dL   RENAL FUNCTION PANEL     Status: Abnormal   Collection Time   11/24/12  7:04 PM      Component Value Range Comment   Sodium 138  135 - 145 mEq/L    Potassium 3.4 (*) 3.5 - 5.1 mEq/L    Chloride 102  96 - 112 mEq/L    CO2 25  19 - 32 mEq/L    Glucose, Bld 113 (*) 70 - 99 mg/dL    BUN 60 (*) 6 - 23 mg/dL    Creatinine, Ser 3.81 (*) 0.50 - 1.10 mg/dL    Calcium 7.6 (*) 8.4 - 10.5 mg/dL    Phosphorus 4.2  2.3 - 4.6 mg/dL    Albumin 2.1 (*) 3.5 - 5.2 g/dL    GFR calc non Af Amer 13 (*) >90 mL/min    GFR calc Af Amer 15 (*) >90 mL/min   GLUCOSE, CAPILLARY     Status: Abnormal   Collection Time   11/24/12  9:03 PM      Component Value Range Comment   Glucose-Capillary 108 (*) 70 - 99 mg/dL   RENAL FUNCTION PANEL     Status: Abnormal   Collection Time   11/25/12  6:35 AM      Component Value Range Comment   Sodium 141  135 - 145 mEq/L    Potassium 3.8  3.5 - 5.1 mEq/L    Chloride 103  96 - 112 mEq/L    CO2 23  19 - 32 mEq/L    Glucose, Bld 89  70 - 99 mg/dL    BUN 60 (*) 6 - 23 mg/dL    Creatinine, Ser 3.80 (*) 0.50 - 1.10 mg/dL    Calcium 7.9 (*) 8.4 - 10.5 mg/dL    Phosphorus 4.5  2.3 - 4.6 mg/dL    Albumin 2.2 (*) 3.5 - 5.2 g/dL    GFR calc non Af Amer 13 (*) >90 mL/min    GFR calc Af Amer 15 (*) >90 mL/min   CBC WITH DIFFERENTIAL     Status: Abnormal   Collection Time   11/25/12  6:35 AM      Component Value Range Comment    WBC 9.9  4.0 - 10.5 K/uL    RBC 2.80 (*) 3.87 - 5.11 MIL/uL    Hemoglobin 8.0 (*)  12.0 - 15.0 g/dL    HCT 26.3 (*) 36.0 - 46.0 %    MCV 93.9  78.0 - 100.0 fL    MCH 28.6  26.0 - 34.0 pg    MCHC 30.4  30.0 - 36.0 g/dL    RDW 17.8 (*) 11.5 - 15.5 %    Platelets 277  150 - 400 K/uL    Neutrophils Relative 81 (*) 43 - 77 %    Neutro Abs 8.0 (*) 1.7 - 7.7 K/uL    Lymphocytes Relative 10 (*) 12 - 46 %    Lymphs Abs 1.0  0.7 - 4.0 K/uL    Monocytes Relative 5  3 - 12 %    Monocytes Absolute 0.5  0.1 - 1.0 K/uL    Eosinophils Relative 4  0 - 5 %    Eosinophils Absolute 0.4  0.0 - 0.7 K/uL    Basophils Relative 0  0 - 1 %    Basophils Absolute 0.0  0.0 - 0.1 K/uL   GLUCOSE, CAPILLARY     Status: Abnormal   Collection Time   11/25/12  8:35 AM      Component Value Range Comment   Glucose-Capillary 108 (*) 70 - 99 mg/dL    Comment 1 Notify RN      Comment 2 Documented in Chart     RENAL FUNCTION PANEL     Status: Abnormal   Collection Time   11/25/12  9:22 AM      Component Value Range Comment   Sodium 141  135 - 145 mEq/L    Potassium 3.8  3.5 - 5.1 mEq/L    Chloride 104  96 - 112 mEq/L    CO2 26  19 - 32 mEq/L    Glucose, Bld 122 (*) 70 - 99 mg/dL    BUN 61 (*) 6 - 23 mg/dL    Creatinine, Ser 3.63 (*) 0.50 - 1.10 mg/dL    Calcium 7.8 (*) 8.4 - 10.5 mg/dL    Phosphorus 4.5  2.3 - 4.6 mg/dL    Albumin 2.2 (*) 3.5 - 5.2 g/dL    GFR calc non Af Amer 14 (*) >90 mL/min    GFR calc Af Amer 16 (*) >90 mL/min   CBC WITH DIFFERENTIAL     Status: Abnormal   Collection Time   11/25/12  9:22 AM      Component Value Range Comment   WBC 11.1 (*) 4.0 - 10.5 K/uL    RBC 2.77 (*) 3.87 - 5.11 MIL/uL    Hemoglobin 7.9 (*) 12.0 - 15.0 g/dL    HCT 25.8 (*) 36.0 - 46.0 %    MCV 93.1  78.0 - 100.0 fL    MCH 28.5  26.0 - 34.0 pg    MCHC 30.6  30.0 - 36.0 g/dL    RDW 17.7 (*) 11.5 - 15.5 %    Platelets 261  150 - 400 K/uL    Neutrophils Relative 82 (*) 43 - 77 %    Neutro Abs 9.1 (*) 1.7 - 7.7  K/uL    Lymphocytes Relative 9 (*) 12 - 46 %    Lymphs Abs 1.0  0.7 - 4.0 K/uL    Monocytes Relative 4  3 - 12 %    Monocytes Absolute 0.5  0.1 - 1.0 K/uL    Eosinophils Relative 5  0 - 5 %    Eosinophils Absolute 0.5  0.0 - 0.7 K/uL    Basophils Relative 0  0 - 1 %    Basophils Absolute 0.0  0.0 - 0.1 K/uL   GLUCOSE, CAPILLARY     Status: Abnormal   Collection Time   11/25/12 11:48 AM      Component Value Range Comment   Glucose-Capillary 108 (*) 70 - 99 mg/dL    Comment 1 Notify RN      Comment 2 Documented in Chart     RENAL FUNCTION PANEL     Status: Abnormal   Collection Time   11/25/12  4:21 PM      Component Value Range Comment   Sodium 141  135 - 145 mEq/L    Potassium 4.1  3.5 - 5.1 mEq/L    Chloride 104  96 - 112 mEq/L    CO2 25  19 - 32 mEq/L    Glucose, Bld 116 (*) 70 - 99 mg/dL    BUN 61 (*) 6 - 23 mg/dL    Creatinine, Ser 3.79 (*) 0.50 - 1.10 mg/dL    Calcium 7.6 (*) 8.4 - 10.5 mg/dL    Phosphorus 4.4  2.3 - 4.6 mg/dL    Albumin 2.1 (*) 3.5 - 5.2 g/dL    GFR calc non Af Amer 13 (*) >90 mL/min    GFR calc Af Amer 15 (*) >90 mL/min   GLUCOSE, CAPILLARY     Status: Abnormal   Collection Time   11/25/12  5:11 PM      Component Value Range Comment   Glucose-Capillary 139 (*) 70 - 99 mg/dL    No results found.   Blood pressure 96/43, pulse 78, temperature 99.6 F (37.6 C), temperature source Oral, resp. rate 19, height 5' 6.14" (1.68 m), weight 162.6 kg (358 lb 7.5 oz), SpO2 91.00%.  PHYSICAL EXAM: Obese.  Very hard of hearing, but able to communicate with me with raised voice.  Her vocal patterns are well modulated.  Cranial nerves intact. Ears:  Canals clear with normal appearing TM's Nose:  Mildly dry and excoriated c/w dry air, possibly nasal cannula O2 or CPAP. OC: moist OP:  Clear Neck: without adenopathy    Assessment/Plan Hard of hearing. Etiology uncertain. No clear ototoxic history.  Her history of prior similar events which resolved  spontaneously suggests a eustachian tube problem.  Her ability to lip read suggests a possible pre-morbid hearing difficulty.  Plan:  Will order an audiogram and tympanogram.  If she is leaving the hospital soon, we could do this in our office.  Speech/Language Pathology may have a loaner hearing aid to use while she is hospitalized.    Thanks,  Jodi Marble [id number], 5:26 PM

## 2012-11-25 NOTE — Progress Notes (Signed)
NUTRITION FOLLOW UP  Intervention:    Discontinue Nepro Shakes Resource Breeze po BID, each supplement provides 250 kcal and 9 grams of protein. RD to continue to follow nutrition care plan  Nutrition Dx:   Inadequate oral intake related to acute illness AEB variable PO intake. Improved.  New Goal: Pt to meet >/= 90% of their estimated nutrition needs.  Monitor:   PO intake, weight trend, labs, I/O  Assessment:   Self-extubated 12/25. BSE on 12/26 recommending Dysphagia 3 diet with nectar-thickened liquids. Advanced to thin liquids on 12/28. PO intake of meals approximately 75%. Mom at bedside confirms that her intake has improved. Does not like Nepro Shakes.  Palliative care met with pt on 12/28 to establish [place]. Pt to continue aggressive treatment of infections and renal failure.  Height: Ht Readings from Last 1 Encounters:  11/10/12 5' 6.14" (1.68 m)    Weight Status:   Wt Readings from Last 1 Encounters:  11/22/12 358 lb 7.5 oz (162.6 kg)    Re-estimated needs:  Kcal: 2200 - 2400 kcal Protein: 110 - 120 gm Fluid: 2.2-2.4 L  Skin: incision on perineum  Diet Order: Dysphagia 3 with thins    Intake/Output Summary (Last 24 hours) at 11/25/12 1417 Last data filed at 11/25/12 1407  Gross per 24 hour  Intake    550 ml  Output   1700 ml  Net  -1150 ml    Last BM: 12/22   Labs:   Lab 11/25/12 0922 11/25/12 0635 11/24/12 1904  NA 141 141 138  K 3.8 3.8 3.4*  CL 104 103 102  CO2 26 23 25   BUN 61* 60* 60*  CREATININE 3.63* 3.80* 3.81*  CALCIUM 7.8* 7.9* 7.6*  MG -- -- --  PHOS 4.5 4.5 4.2  GLUCOSE 122* 89 113*    CBG (last 3)   Basename 11/25/12 1148 11/25/12 0835 11/24/12 2103  GLUCAP 108* 108* 108*    Scheduled Meds:    . calcium carbonate  1,000 mg of elemental calcium Oral QHS  . carvedilol  6.25 mg Oral BID WC  . cinacalcet  90 mg Oral Q breakfast  . darbepoetin (ARANESP) injection - NON-DIALYSIS  200 mcg Subcutaneous Q Thu-1800  .  dextrose  1 ampule Intravenous Once  . DULoxetine  60 mg Oral Daily  . feeding supplement (NEPRO CARB STEADY)  237 mL Oral TID BM  . insulin aspart  0-20 Units Subcutaneous TID WC  . levothyroxine  25 mcg Oral QAC breakfast  . paricalcitol  2 mcg Oral Daily  . saccharomyces boulardii  250 mg Oral BID    Continuous Infusions:   Inda Coke MS, RD, LDN Pager: [phone] After-hours pager: [phone]

## 2012-11-26 ENCOUNTER — Inpatient Hospital Stay (HOSPITAL_COMMUNITY): Payer: BC Managed Care – PPO

## 2012-11-26 LAB — CBC
Hemoglobin: 7.9 g/dL — ABNORMAL LOW (ref 12.0–15.0)
MCH: 29 pg (ref 26.0–34.0)
MCHC: 30.9 g/dL (ref 30.0–36.0)
MCV: 94.1 fL (ref 78.0–100.0)
Platelets: 260 10*3/uL (ref 150–400)

## 2012-11-26 LAB — RENAL FUNCTION PANEL
CO2: 25 mEq/L (ref 19–32)
Calcium: 7.8 mg/dL — ABNORMAL LOW (ref 8.4–10.5)
Creatinine, Ser: 3.74 mg/dL — ABNORMAL HIGH (ref 0.50–1.10)
GFR calc Af Amer: 15 mL/min — ABNORMAL LOW (ref >90)
Glucose, Bld: 100 mg/dL — ABNORMAL HIGH (ref 70–99)
Sodium: 139 mEq/L (ref 135–145)

## 2012-11-26 LAB — GLUCOSE, CAPILLARY
Glucose-Capillary: 107 mg/dL — ABNORMAL HIGH (ref 70–99)
Glucose-Capillary: 123 mg/dL — ABNORMAL HIGH (ref 70–99)
Glucose-Capillary: 118 mg/dL — ABNORMAL HIGH (ref 70–99)

## 2012-11-26 LAB — FERRITIN: Ferritin: 588 ng/mL — ABNORMAL HIGH (ref 10–291)

## 2012-11-26 LAB — IRON AND TIBC
Iron: 29 ug/dL — ABNORMAL LOW (ref 42–135)
UIBC: 132 ug/dL (ref 125–400)

## 2012-11-26 MED ORDER — WHITE PETROLATUM GEL
Status: AC
  Administered 2012-11-26: 21:00:00
  Filled 2012-11-26: qty 5

## 2012-11-26 MED ORDER — TERBINAFINE HCL 1 % EX CREA
TOPICAL_CREAM | Freq: Every day | CUTANEOUS | Status: DC
  Administered 2012-11-26 – 2012-11-29 (×4): via TOPICAL
  Administered 2012-11-30: 1 via TOPICAL
  Administered 2012-11-30 – 2012-12-03 (×3): via TOPICAL
  Filled 2012-11-26 (×2): qty 12

## 2012-11-26 NOTE — Progress Notes (Addendum)
Palliative Medicine Team SW Psychosocial follow up. Pt alert and in good spirits as she reports this is the first day that she has been "all the way with it". Pt reports difficulty hearing, but able to converse through lip reading and written notes. Pt states that she sounds like 'minnie mouse' to herself and that I sound "normal, but far away". Introduced myself and role for emotional support. Pt reports distress related to having had some trouble with her memory and reports surprise that today is NYE. Pt expressed frustration regarding her perception that no one has told her when or why she came into the hospital. Pt reports desire to know what is going on with her medically. Explained that I am not a medical provider, but did tell pt her admission date and reason per chart. Explained the purpose of PMT to help pts and families understand current condition and help chart a plan moving forward. Offered to have PMT provider come speak with her, which she is agreeable to. Pt reports "I can tell it's serious" and states desire to have her husband and mother present, who she reports as her primary support. Pt lamented being in the hospital over the holiday and expressed eagerness to know when she can d/c so that she can attend church from which she draws strength. Will continue to follow for emotional support.   11:00 AM Discussed pt during PMT rounds. Received report that pt has been offered information about her admission/medical status in past GOC mtg. PMT to attempt to regoal with pt when family available. Discussed concerns about pt's cognitive/memory impairment that may impede her ability to retain info given and her family's exhibited pattern of protectiveness toward sharing troubling info.   2:38 PM Return visit, pt in with several friends from church, no family present. Unable to assess pt's recollection of my visit from earlier. Will follow up Thursday as needed.  Jim Like, LCSWA PMT Phone  [phone]

## 2012-11-26 NOTE — Progress Notes (Signed)
Physical Therapy Treatment Patient Details Name: Shalecia Hoop MRN: [id number] DOB: 01/15/1961 Today's Date: 11/26/2012 Time: [id number] PT Time Calculation (min): 41 min  PT Assessment / Plan / Recommendation Comments on Treatment Session  51 years old morbid obese female with complex PMH relevant for systolic CHF with LVEF of 35 to 40%, HTN, DM2, hypothyroidism, chronic pain on narcotics and CKD being evaluated by nephrology for dialysis. She also has a complex history of suppurative hidradenitis and is on chronic antibiotic therapy. In icu for PNA, septic shock, ARF. Pt is progressing better with mobility today.  Participating with PT/OT co-session.  We came by ~1 hour prior to therapy to premedicate and make sure she knew we were coming to see her.  She still needed max encouragement to participate as she wanted to deferr due to pain and then becuase she had not had a bath yet.  Mom helped encourage her and she did finially agree to participate.  She was able to take step to the recliner chair with the RW today.  I continue to think that she is an excellent inpatient rehab candidate.      Follow Up Recommendations  CIR     Does the patient have the potential to tolerate intense rehabilitation   yes  Barriers to Discharge  none      Equipment Recommendations  Rolling walker with 5" wheels;Wheelchair (measurements PT);Wheelchair cushion (measurements PT) (bari RW)    Recommendations for Other Services Rehab consult  Frequency Min 3X/week   Plan Discharge plan remains appropriate;Frequency remains appropriate    Precautions / Restrictions Precautions Precautions: Fall Precaution Comments: morbid obesity   Pertinent Vitals/Pain 4/10 faces pain level, pt premedicated prior to PT/OT session reports pain on posterior left thigh    Mobility  Bed Mobility Bed Mobility: Supine to Sit Supine to Sit: 1: +2 Total assist Supine to Sit: Patient Percentage:  (25 %) Sitting - Scoot to Edge of  Bed: 2: Max assist Details for Bed Mobility Assistance: two person assist to help progress legs, hips and trunk to EOB.  Two people to support trunk to get to supine, pt initating movement of legs, hips and trunk by pulling on bedrail.   Transfers Transfers: Sit to Stand;Stand to Sit;Stand Pivot Transfers Sit to Stand: 1: +2 Total assist;With upper extremity assist;From bed Sit to Stand: Patient Percentage: 50% Stand to Sit: 1: +2 Total assist;Without upper extremity assist Stand to Sit: Patient Percentage: 50% Stand Pivot Transfers: 1: +2 Total assist;From elevated surface Stand Pivot Transfers: Patient Percentage: 50% Details for Transfer Assistance: Pt used UEs to pull up on the walker on 2 standing attempts.      PT Goals Acute Rehab PT Goals PT Goal: Supine/Side to Sit - Progress: Progressing toward goal PT Goal: Sit to Stand - Progress: Progressing toward goal PT Goal: Stand to Sit - Progress: Progressing toward goal PT Transfer Goal: Bed to Chair/Chair to Bed - Progress: Progressing toward goal Pt will Ambulate: 1 - 15 feet;with rolling walker;with mod assist PT Goal: Ambulate - Progress: Goal set today  Visit Information  Last PT Received On: 11/26/12 Assistance Needed: +2 PT/OT Co-Evaluation/Treatment: Yes    Subjective Data  Subjective: Pt reports flare-up of hydronephrosis (painful bumps on posterior aspect of legs)   Cognition  Overall Cognitive Status: Impaired Area of Impairment: Problem solving Arousal/Alertness: Awake/alert Orientation Level: Appears intact for tasks assessed Behavior During Session: Other (comment) (Pt initially resistant to participate but eventually did.) Cognition - Other Comments: Cannot hear-being  followed by ENT and ST    Balance  Balance Balance Assessed: Yes Static Sitting Balance Static Sitting - Balance Support: Bilateral upper extremity supported;Feet supported Static Sitting - Level of Assistance: 5: Stand by assistance Static  Sitting - Comment/# of Minutes: pt seated EOB >10 mins with right lateral lean.  Pt able to self correct posture.  "I don't know why I am leaning" Static Standing Balance Static Standing - Balance Support: Right upper extremity supported;Left upper extremity supported Static Standing - Level of Assistance: 1: +2 Total assist;Patient percentage (comment);Other (comment) (50%)  End of Session PT - End of Session Activity Tolerance: Patient limited by pain;Patient limited by fatigue Patient left: in bed;with call bell/phone within reach;with family/visitor present Nurse Communication: Mobility status (spoke with RN and RN tech re: getting her back to bed)       Wells Guiles B. [place], [place], DPT [phone]  11/26/2012, 4:03 PM

## 2012-11-26 NOTE — Progress Notes (Signed)
RT Note: Placed pt on BIPAP 12/6 with full face mask. Pt tolerating well.RT and RN will continue to monitor.

## 2012-11-26 NOTE — Progress Notes (Signed)
PT refused her bipap, said she likes wearing it in the morning and doesn't need it now

## 2012-11-26 NOTE — Progress Notes (Signed)
PROGRESS NOTE  [name] [id number] DOB: 03-31-1961 DOA: 11/10/2012 PCP: [name], MD  Brief narrative: 51 years old morbid obese female with complex PMH relevant for systolic CHF with LVEF of 35 to 40%, HTN, DM2, hypothyroidism, chronic pain on narcotics and CKD being evaluated by nephrology for dialysis. She also has a complex history of suppurative hidradenitis and has been on chronic antibiotic therapy per ID. She was thought to [facility] 12/15 because she was hypoxic in respiratory distress and had altered mental status and was lethargic. She required intubation mechanical ventilation due to bradycardic arrest-please see events below  Past medical history-As per Problem list Chart reviewed as below-  Chronic hidradenitis + leukocytosis-seen at [facility], seen by infectious disease 01/19/12 admission   admitted 04/04/12 for symptomatic anemia hemoglobin 6.7   Presumed nonischemic cardiomyopathy last EF 35%-40% 04/06/12   Chronic kidney disease stage III, followed by nephrology baseline creatinine 2.4   Type 2 diabetes mellitus, last A1c 8.5   Acute on chronic systolic congestive heart failure-admitted 01/19/12, Lasix 60 twice a day given.   Admit 08/16/11-status post 4 units packed red blood cells   History of hypothyroidism, ? MNG   Morbid obesity   Hyperlipidemia   Gout   Evaluated 02/08/11 by plastic surgery at [place]:  12/15 vanc>>>12/15  12/15 zosyn>>>12/17  12/16 levofloxacin>>>12/17  12/16 ceftriaxone>>>12/29 (planned)   Cultures:  12/15 BC x 2>>>E coli sens ceftr  12/15 urine>>>E coli (variable res)  12/15 tracheal >>>NF   Lines:  12/15 left IJ >>>  12/15 Rt rad>>>12/19  12/15 ETT>>>12/25  12/16 rt fem HD >>>12/25   Events/ [place]udeis:  12/15 >>> Arrest  12/15 renal US>>Suboptimal examination. There may be mild pelvicaliectasis on the<BR>right.<BR> <BR>Left kidney not visualized  12/16>>>ARF, shock, started on cvvhd  12/16  echo - 55% EF, mod reduced rv fxn, PA 46  12/17- pressors better, K resolved  12/17 CT chest- bibasilr small infiltrates / atx  12/17 CT abdo/pelvis>>>Left ureteral stent placement following removal of left  ureteral calculi. No residual left-sided hydronephrosis or ureteral stone fragments. Apparent lithotripsy of the right renal calculi with obstructing 10 mm stone fragment at the right ureteral pelvic junction. There are small renal calyceal stone fragments bilaterally. Anasarca  12/18- off pressors, weaning, awake  12/18- urology called, remains off pressors, failed weaning  12/20- neg balance  12/21 >tranx WL for uro proced>Cystoscopy, left ureter stent removal, bilateral retrograde pyelogram, right ureter stent placement  12/24 bscopy for RUL atx     Subjective  Alert-no new specific issues.  Wishes to go home.  Memory seems little better. Was up out of bed today but was very weak-still made a couple of steps with therapy. Seems happy but still hearing is suboptimal. No fever no chills no chest pain no diarrhea no nausea    Objective    Interim History: ENT review patient and recommended undergarment  And recommended speech which pathology for her to use a hearing aid  Telemetry: Normal sinus rhythm  Objective: Filed Vitals:   11/25/12 2026 11/26/12 0627 11/26/12 0922 11/26/12 1415  BP: 112/58 119/70 113/42 104/63  Pulse: 63 72 68 72  Temp: 98.2 F (36.8 C) 99.1 F (37.3 C) 98.6 F (37 C) 99 F (37.2 C)  TempSrc: Oral Oral Oral Oral  Resp: 18 18 18 18   Height:      Weight:      SpO2: 94% 95% 97% 97%    Intake/Output Summary (Last  24 hours) at 11/26/12 1710 Last data filed at 11/26/12 1417  Gross per 24 hour  Intake    600 ml  Output   1150 ml  Net   -550 ml    Exam:  General: Obese pleasant African American female in no apparent distress Cardiovascular: S1-S2 no murmur rub or gallop Respiratory: Clinically clear Abdomen: Soft nontender  nondistended Skin hidradenitis scars over abdomen under breasts-she has areas in the mons pubis and bilateral intertriginous area is that have all scars of her tendinitis. On her bottom and around her anus she has areas of swelling and all scars which bleed when touched. None of these areas appear grossly infected or purulent Neuro hearing is significantly impaired  Data Reviewed: Basic Metabolic Panel:  Lab [id number] 0645 11/25/12 1621 11/25/12 0922 11/25/12 0635 11/24/12 1904  NA 139 141 141 141 138  K 4.0 4.1 -- -- --  CL 103 104 104 103 102  CO2 25 25 26 23 25   GLUCOSE 100* 116* 122* 89 113*  BUN 59* 61* 61* 60* 60*  CREATININE 3.74* 3.79* 3.63* 3.80* 3.81*  CALCIUM 7.8* 7.6* 7.8* 7.9* 7.6*  MG -- -- -- -- --  PHOS 4.9* 4.4 4.5 4.5 4.2   Liver Function Tests:  Lab 11/26/12 0645 11/25/12 1621 11/25/12 0922 11/25/12 0635 11/24/12 1904 11/21/12 0445  AST -- -- -- -- -- 10  ALT -- -- -- -- -- <5  ALKPHOS -- -- -- -- -- 90  BILITOT -- -- -- -- -- 0.3  PROT -- -- -- -- -- 7.0  ALBUMIN 2.1* 2.1* 2.2* 2.2* 2.1* --   No results found for this basename: LIPASE:5,AMYLASE:5 in the last 168 hours No results found for this basename: AMMONIA:5 in the last 168 hours CBC:  Lab 11/26/12 0645 11/25/12 0922 11/25/12 0635 11/24/12 1234 11/23/12 0924  WBC 10.5 11.1* 9.9 11.8* 10.7*  NEUTROABS -- 9.1* 8.0* 10.0* --  HGB 7.9* 7.9* 8.0* 7.7* 7.4*  HCT 25.6* 25.8* 26.3* 25.3* 24.4*  MCV 94.1 93.1 93.9 92.3 93.5  PLT 260 261 277 252 264   Cardiac Enzymes: No results found for this basename: CKTOTAL:5,CKMB:5,CKMBINDEX:5,TROPONINI:5 in the last 168 hours BNP: No components found with this basename: POCBNP:5 CBG:  Lab 11/26/12 1142 11/26/12 0921 11/26/12 0705 11/25/12 2045 11/25/12 1711  GLUCAP 123* 107* 91 90 139*    Recent Results (from the past 240 hour(s))  CULTURE, BAL-QUANTITATIVE     Status: Normal   Collection Time   11/19/12 11:40 AM      Component Value Range Status Comment    Specimen Description BRONCHIAL ALVEOLAR LAVAGE   Final    Special Requests Normal   Final    Gram Stain     Final    Value: FEW WBC PRESENT,BOTH PMN AND MONONUCLEAR     FEW SQUAMOUS EPITHELIAL CELLS PRESENT     NO ORGANISMS SEEN   Colony Count 80,000 COLONIES/ML   Final    Culture Non-Pathogenic Oropharyngeal-type Flora Isolated.   Final    Report Status 11/22/2012 FINAL   Final      Studies:              All Imaging reviewed and is as per above notation   Scheduled Meds:    . calcium carbonate  1,000 mg of elemental calcium Oral QHS  . carvedilol  6.25 mg Oral BID WC  . cinacalcet  90 mg Oral Q breakfast  . darbepoetin (ARANESP) injection - NON-DIALYSIS  200  mcg Subcutaneous Q Thu-1800  . dextrose  1 ampule Intravenous Once  . DULoxetine  60 mg Oral Daily  . feeding supplement  1 Container Oral BID BM  . insulin aspart  0-20 Units Subcutaneous TID WC  . levothyroxine  25 mcg Oral QAC breakfast  . paricalcitol  2 mcg Oral Daily  . saccharomyces boulardii  250 mg Oral BID  . terbinafine   Topical Daily   Continuous Infusions:    Assessment/Plan: 1. Acute respiratory failure with hypoxia-currently resolved. Continue BiPAP at night. She had a bronchoscopy and secretions of right upper lobe atelectasis were removed at that time on the 24th-see below 2. Severe septic shock 2/2 Ecoli status post left ureteric stent removal, right ureter stent placement 12/21+ bilateral pneumonia bases-antibiotics were managed and weaned per CCM. complete 14 days therapy on 12/29 3. Leukocytosis-chronic and thought probably to be secondary to her hidradenitis.  Will follow and review carefully. Chest x-ray 12/30 once showed just cardiomegaly and vascular congestion which is improved 4. Acute renal failure-secondary to shock. Nephrology managing. Creatinine currently stable at around 3.0 and this has plateaud-net negative -0.82 l urine-unclear if patient needs Zemplar, Sensipar at present-I. have  discontinued these 5. Anemia-multifactorial-AoCD + 2/2 to periods vs chronic losses with Hidradenitis.  hemoglobin appears to be at baseline between 7 and 9. She was transfused once on the 19th and again on the 26th. Follow crit-she is on currently Darbepoetin 200 mcg q week-she may need transfusion dependent and hemoglobin 11/27/2012-someone ordered anemia profile which shows anemia of chronic disease 6. Hypothyroid-TU levothyroxine 25 mcg daily 7. Diastolic HF, EF 123456, PA Peak 45%-hold diuretics at present. Patient appears compensated right now-re-ordered Coreg 6.25 bid.  Hold home medications of Imdur 30 mg every 24 hourly, Lasix 240 bid  8. DM ty 2-sugar is acceptable 108-122  range. Continue dysphagia 3 diet, Nepro Carb steady state 9. Dysphagia-continue to thicken liquids 10. Chronic hidradenitis-Might need suppressive therapy-wounds appear non infected at present 11. Depression-continue Cymbalta 60 mg daily 12. Chronic pain continue Percocet 5/225 every 4 when necessary, Flexeril 10 mg 3 times a day when necessary 13. Metabolic bone disease-see above 14. 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 Tympanometry/ Audiological evaluation ordered. Speech-language pathologist has been recommended to give patient a hearing aid to see how she does. 15. Hypothyroidism-continue levothyroxine 50 mcg every morning  Code Status: Full Family Communication: > 15 minute discussion with mother and patient. Explained course of events until today. Explained patient had cardiac arrest, she had surgery, she was intubated,-this was done with written instructions. She does admit to be looking better but her husband and I spoke today and patient is contemplating full CODE STATUS at present time. I explained to her patient's mother I just want to sensitize her and family to the seriousness of her condition at the time that she came in in possibility that this could recur. They understood. Disposition Plan: ? Skilled  nursing facility in 2-3 days once creatinine plateaus and declines to close to normal levels   Verneita Griffes, MD  [facility] Pager [phone] 11/26/2012, 5:10 PM    LOS: 16 days

## 2012-11-26 NOTE — Progress Notes (Signed)
Speech Language Pathology Dysphagia Treatment Patient Details Name: [name] MRN: [id number] DOB: 12/03/1960 Today's Date: 11/26/2012 Time: 1040-1050 SLP Time Calculation (min): 10 min  Assessment / Plan / Recommendation Clinical Impression  Pt. complained of leg pain and politely declined repositioning prior to po consumption, therefore sips water consumed with slightly decreased trunk position (she reports "this is how I normally am.").  No s/s aspirtion were observed during single and larger cup sips water as well as straw sips.  Oral prep, mastication and transit of graham cracker was WFL's.  Recommend continue thin liquids with straws if pt. desires.  Recommend continuing Dys 3 diet due to possible positioning challenges due to pt.'s weight.  Will see once more for dysphagia to further determine safety and efficiency.        Diet Recommendation  Continue with Current Diet: Dysphagia 3 (mechanical soft);Thin liquid    SLP Plan Continue with current plan of care       Swallowing Goals  SLP Swallowing Goals Patient will utilize recommended strategies during swallow to increase swallowing safety with: Supervision/safety Swallow Study Goal #2 - Progress: Progressing toward goal Goal #3: Patient will consume trials of thin liquids at bedside without overt s/s of aspiration to determine potential for diet upgrade.  Swallow Study Goal #3 - Progress: Met  General Temperature Spikes Noted: No Respiratory Status: Room air Behavior/Cognition: Alert;Cooperative;Pleasant mood;Hard of hearing Oral Cavity - Dentition: Adequate natural dentition Patient Positioning:  (in bed, trunk positioned lower in bed, pt. c/o leg pain)  Oral Cavity - Oral Hygiene Does patient have any of the following "at risk" factors?: Oxygen therapy - cannula, mask, simple oxygen devices Brush patient's teeth BID with toothbrush (using toothpaste with fluoride): Yes Patient is AT RISK - Oral Care Protocol followed  (see row info): Yes   Dysphagia Treatment Treatment focused on: Skilled observation of diet tolerance;Patient/family/caregiver education Treatment Methods/Modalities: Skilled observation Patient observed directly with PO's: Yes Type of PO's observed: Thin liquids Feeding: Able to feed self Liquids provided via: Cup;Straw Type of cueing: Verbal Amount of cueing: Minimal        Houston Siren M.Ed [employer] [phone]  11/26/2012

## 2012-11-26 NOTE — Progress Notes (Signed)
Occupational Therapy Treatment Patient Details Name: [name] MRN: [id number] DOB: 1961-06-03 Today's Date: 11/26/2012 Time: [id number] OT Time Calculation (min): 42 min  OT Assessment / Plan / Recommendation Comments on Treatment Session Pt with improved activity participation and pain tolerance this session.  Able to perform 3 sit to stand transitions for up to 1 1/2 mins using the RW as well with total assist +2.  Also able to perform stand pivot transfer to bedside chair.  Encouraged her to get up everyday with nursing to help increase strength and activity tolerance.    Follow Up Recommendations  SNF       Equipment Recommendations  None recommended by OT       Frequency Min 2X/week   Plan Discharge plan remains appropriate    Precautions / Restrictions Precautions Precautions: Fall Precaution Comments: morbid obesity   Pertinent Vitals/Pain Pt with pain in her bottom    ADL  Toilet Transfer: Simulated;+2 Total assistance Toilet Transfer: Patient Percentage: 50% Toilet Transfer Method: Stand pivot;Other (comment) (with use of RW) Science writer: Other (comment) (pt transferred to bedside chair) [place] and Hygiene: Performed;+2 Total assistance Toileting - Clothing Manipulation and Hygiene: Patient Percentage: 50% Where Assessed - Toileting Clothing Manipulation and Hygiene: Sit to stand from 3-in-1 or toilet Equipment Used: Rolling walker Transfers/Ambulation Related to ADLs: Pt able to take 2-3 small steps to the bedside chair using the RW for support and total assist +2 (pt 50%) ADL Comments: Pt still needed increased incouragement to participate but finally agreed.  Pt transitioned to sitting with total assist +2 (pt 25%).  She was able to sit EOB after repositioning with min guard assist.  Needed max assist for reciprical scooting to EOB.  Performed sit to stand X 3 during session.  Able to maintain standing for approximately 1  1/2 mins on 2 attempts.  Utilized RW for support with standing on final 2 attempts with good result.  Therapist assisted with cleaning perianal hygiene while pt stood as well.  On third attempt pt performed stand pivot transfer with the RW to the bedside chair.     OT Goals Acute Rehab OT Goals OT Goal Formulation: With patient ADL Goals ADL Goal: Toilet Transfer - Progress: Progressing toward goals ADL Goal: Toileting - Clothing Manipulation - Progress: Progressing toward goals  Visit Information  Last OT Received On: 11/26/12 Assistance Needed: +2 PT/OT Co-Evaluation/Treatment: Yes    Subjective Data  Subjective: I was asleep and my leg is really hurting. Patient Stated Goal: Pt did not state during this session.      Cognition  Overall Cognitive Status: Impaired Area of Impairment: Problem solving Arousal/Alertness: Awake/alert Orientation Level: Appears intact for tasks assessed Behavior During Session: Other (comment) (Pt initially resistant to participate but eventually did.) Cognition - Other Comments: Cannot hear-being followed by ENT and ST    Mobility  Shoulder Instructions Bed Mobility Bed Mobility: Supine to Sit Supine to Sit: 1: +2 Total assist Supine to Sit: Patient Percentage:  (25 %) Sitting - Scoot to Edge of Bed: 2: Max assist Details for Bed Mobility Assistance: two person assist to help progress legs, hips and trunk to EOB.  Two people to support trunk to get to supine, pt initating movement of legs, hips and trunk by pulling on bedrail.   Transfers Transfers: Sit to Stand Sit to Stand: 1: +2 Total assist;With upper extremity assist;From bed Sit to Stand: Patient Percentage: 50% Stand to Sit: 1: +2 Total assist;Without upper  extremity assist Stand to Sit: Patient Percentage: 50% Details for Transfer Assistance: Pt used UEs to pull up on the walker on 2 standing attempts.          Balance Balance Balance Assessed: Yes Static Sitting Balance Static  Sitting - Balance Support: Bilateral upper extremity supported;Feet supported Static Sitting - Level of Assistance: 5: Stand by assistance Static Sitting - Comment/# of Minutes: pt seated EOB >10 mins with right lateral lean.  Pt able to self correct posture.  "I don't know why I am leaning" Static Standing Balance Static Standing - Balance Support: Right upper extremity supported;Left upper extremity supported Static Standing - Level of Assistance: 1: +2 Total assist;Patient percentage (comment);Other (comment) (50%)   End of Session OT - End of Session Activity Tolerance: Patient limited by fatigue Patient left: in chair;with call bell/phone within reach Nurse Communication: Mobility status     [place] OTR/L Pager number [phone] 11/26/2012, 4:05 PM

## 2012-11-26 NOTE — Progress Notes (Signed)
[facility]  Subjective:  Awake, still can't hear. Seen by Dr. Erik Obey (ENT) yesterday Mother and husband at bedside   Objective: Vital signs in last 24 hours: Blood pressure 113/42, pulse 68, temperature 98.6 F (37 C), temperature source Oral, resp. rate 18, height 5' 6.14" (1.68 m), weight 162.6 kg (358 lb 7.5 oz), SpO2 97.00%.    PHYSICAL EXAM General--as above  Chest--clear  Heart--no rub  Abd--corpulent, nontender.  Some indurated areas on ant abd wall--these look better acc. to mother and husband  Extr--trace edema    Lab Results:   Lab 11/26/12 0645 11/25/12 1621 11/25/12 0922  NA 139 141 141  K 4.0 4.1 3.8  CL 103 104 104  CO2 25 25 26   BUN 59* 61* 61*  CREATININE 3.74* 3.79* 3.63*  ALB -- -- --  GLUCOSE 100* -- --  CALCIUM 7.8* 7.6* 7.8*  PHOS 4.9* 4.4 4.5     Basename 11/26/12 0645 11/25/12 0922  WBC 10.5 11.1*  HGB 7.9* 7.9*  HCT 25.6* 25.8*  PLT 260 261     I have reviewed the patient's current medications.  Assessment/Plan: 1. AKI supperimposed on CKD (baseline Cr 2.04 on 05 Sep 2012). Cr was 8.98 on admission. Cr starting to fall now  2. Obstructed R ureter--s/p removal of L ureteral stent, removal of R Renal stone, and placement of R ureteral stent by Dr. Jasmine December on 16 Nov 2012.  3. Anemia--received feraheme 510 x 2 last week. On aranesp 200/wk  4. Pneumonia--better on Ceftriaxone  5. E coli bacteremia- " " "  6. DM-2--per primary service  7. 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 On sensipar, po CaCO3, and IV paracalcitol. Will change to po paracalcitol  8. Hearing loss-- ENT saw and made recommendations   Have asked staff to wash ant abd wall each day then apply small amount lamisil cream     LOS: 16 days   Payton Prinsen F 11/26/2012,11:57 AM   .[email or web address]

## 2012-11-26 NOTE — Evaluation (Addendum)
Speech Language Pathology Evaluation Patient Details Name: [name] MRN: [id number] DOB: 15-Apr-1961 Today's Date: 11/26/2012 Time: 1015-1040 SLP Time Calculation (min): 25 min  Problem List:  Patient Active Problem List  Diagnosis  . GOITER, MULTINODULAR  . Morbid obesity  . MOOD DISORDER IN CONDITIONS CLASSIFIED ELSEWHERE  . GERD  . Pyoderma gangrenosum  . ERYTHEMA NODOSUM, HX OF  . PSEUDOMONAS INFECTION  . Systolic congestive heart failure  . DM type 2 (diabetes mellitus, type 2)  . Chronic kidney disease (CKD), stage III (moderate)  . Acute on chronic systolic heart failure  . Hidradenitis suppurativa  . Anemia  . Hydronephrosis of left kidney  . Nephrolithiasis  . ARF (acute renal failure)  . Sepsis  . Acute respiratory failure with hypoxia  . Atelectasis   Past Medical History:  Past Medical History  Diagnosis Date  . Systolic congestive heart failure   . SOB (shortness of breath)   . HTN (hypertension)   . DM type 2 (diabetes mellitus, type 2)   . Chronic kidney disease (CKD), stage III (moderate)   . Iron deficiency anemia   . Morbid obesity   . Hyperkalemia   . Hypothyroidism   . CHF (congestive heart failure)    Past Surgical History:  Past Surgical History  Procedure Date  . Portacath placement   . Cystoscopy w/ ureteral stent placement 05/28/2012    Procedure: CYSTOSCOPY WITH RETROGRADE PYELOGRAM/URETERAL STENT PLACEMENT;  Surgeon: Reece Packer, MD;  Location: [facility];  Service: Urology;  Laterality: Left;  . Cystoscopy w/ retrogrades 11/16/2012    Procedure: CYSTOSCOPY WITH RETROGRADE PYELOGRAM;  Surgeon: Reece Packer, MD;  Location: [facility];  Service: Urology;  Laterality: Bilateral;  CYSTOSCOPY,BILATERAL RETROGRADE PYELOGRAM/ REMOVAL LEFT URETERAL STENT/ FULGERATION BLADDER MUCOSA/ INSERTION RIGHT URETERAL STENT   HPI:  This 51 y.o. female admitted with sepsis and respiratory distress.  Pt. intubated 12/15-12/25.  PMH: SOB, HTN, DM 2,  morbid obesity, hypothyroidism, CHF.  Pt. with acute hearing loss since hospital admit that appearently has occurred prior to this admission and spontaneously resolved.  Orders received for speech-lang-cognitive assessment as well as for loaner hearing amplifier.   Assessment / Plan / Recommendation Clinical Impression  Pt. seen for speech-language-cognitive evaluation.  Pt. able to hear verbal message via lip reading and SLP increasing vocal intensity and over articulating during speech.  MD has ordered an audiology consult and this SLP is attempting to locate [facility] trainer here at hospital for pt. to utilize while here (although she could hear 100% when SLP utilized speech strategies).  Pt.'s speech intellibiility and language were WFL's.  She exhibited moderate-severe cognitive deficits in the area of working and prospective memory and was unable to recall information she told this SLP 5 minutes earlier during several instances.  Pt.'s attention, problem solving orientation and reasoning were WFL's.  Pt. stated she had no memory difficulty prior to this admission.  Uncertain of etiology of current memory impairments (attention, stressors with current medical status ?).  She became labile several times when unable to recall information important to her.  SLP will follow while in acute care and may recommend ST post acute care.        SLP Assessment  Patient needs continued Speech Lanaguage Pathology Services    Follow Up Recommendations  Outpatient SLP    Frequency and Duration min 2x/week  1 week       SLP Goals  SLP Goals Potential to Achieve Goals: Good Potential Considerations: Previous level  of function SLP Goal #1: Pt. will recall memory techniques to facilitate working and prospective memory with modified independence. SLP Goal #2: Pt. will implement memory strategies to recall newly learned information with min verbal/visual assist.  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)   Cognition  Overall Cognitive Status: Impaired Arousal/Alertness: Awake/alert Orientation Level: Oriented X4 Attention: Sustained Sustained Attention: Appears intact Memory: Impaired Memory Impairment: Storage deficit;Decreased recall of new information;Decreased short term memory;Prospective memory;Retrieval deficit Awareness: Appears intact Problem Solving: Appears intact Behaviors: Lability Safety/Judgment: Appears intact    Comprehension  Auditory Comprehension Overall Auditory Comprehension: Appears within functional limits for tasks assessed Yes/No Questions: Within Functional Limits Commands: Within Functional Limits Conversation: Complex Interfering Components: Hearing;Working memory EffectiveTechniques: Increased volume;Stressing words;Slowed speech Visual Recognition/Discrimination Discrimination: Not tested Reading Comprehension Reading Status: Within funtional limits    Expression Expression Primary Mode of Expression: Verbal Verbal Expression Overall Verbal Expression: Appears within functional limits for tasks assessed Initiation: No impairment Level of Generative/Spontaneous Verbalization: Conversation Repetition: No impairment Naming: No impairment Pragmatics: No impairment Written Expression Dominant Hand: Right Written Expression: Not tested   Oral / Motor Oral Motor/Sensory Function Overall Oral Motor/Sensory Function: Appears within functional limits for tasks assessed Motor Speech Overall Motor Speech: Appears within functional limits for tasks assessed Respiration: Within functional limits Phonation: Normal Resonance: Within functional limits Articulation: Within functional limitis Intelligibility: Intelligible Motor Planning: Witnin functional limits Interfering Components:  Hearing loss        Houston Siren M.Ed [employer] [phone]  11/26/2012

## 2012-11-27 DIAGNOSIS — E119 Type 2 diabetes mellitus without complications: Secondary | ICD-10-CM

## 2012-11-27 DIAGNOSIS — M79609 Pain in unspecified limb: Secondary | ICD-10-CM

## 2012-11-27 LAB — CBC
MCH: 28.4 pg (ref 26.0–34.0)
MCHC: 30.5 g/dL (ref 30.0–36.0)
Platelets: 271 10*3/uL (ref 150–400)
RBC: 2.75 MIL/uL — ABNORMAL LOW (ref 3.87–5.11)
RDW: 17.9 % — ABNORMAL HIGH (ref 11.5–15.5)

## 2012-11-27 LAB — RENAL FUNCTION PANEL
Albumin: 1.9 g/dL — ABNORMAL LOW (ref 3.5–5.2)
Chloride: 105 mEq/L (ref 96–112)
Creatinine, Ser: 3.59 mg/dL — ABNORMAL HIGH (ref 0.50–1.10)
GFR calc non Af Amer: 14 mL/min — ABNORMAL LOW (ref >90)

## 2012-11-27 LAB — GLUCOSE, CAPILLARY: Glucose-Capillary: 105 mg/dL — ABNORMAL HIGH (ref 70–99)

## 2012-11-27 MED ORDER — LEVOTHYROXINE SODIUM 25 MCG PO TABS
25.0000 ug | ORAL_TABLET | Freq: Every day | ORAL | Status: DC
  Administered 2012-11-28 – 2012-12-03 (×6): 25 ug via ORAL
  Filled 2012-11-27 (×7): qty 1

## 2012-11-27 MED ORDER — LEVOTHYROXINE SODIUM 50 MCG PO TABS
50.0000 ug | ORAL_TABLET | Freq: Every day | ORAL | Status: DC
  Filled 2012-11-27: qty 1

## 2012-11-27 MED ORDER — FUROSEMIDE 40 MG PO TABS
40.0000 mg | ORAL_TABLET | Freq: Two times a day (BID) | ORAL | Status: DC
  Administered 2012-11-27 – 2012-11-29 (×5): 40 mg via ORAL
  Filled 2012-11-27 (×9): qty 1

## 2012-11-27 NOTE — Progress Notes (Signed)
[facility]  Subjective:  Awake, alert, husband and parents at bedside.  Hearing a LITTLE BETTER today  Notes from office indicate Cr 2.76 and PTH 200 in Oct 2013   Objective: Vital signs in last 24 hours: Blood pressure 129/65, pulse 75, temperature 98 F (36.7 C), temperature source Oral, resp. rate 18, height 5\' 4"  (1.626 m), weight 162.6 kg (358 lb 7.5 oz), SpO2 96.00%.    PHYSICAL EXAM General--as above  Chest--clear  Heart--no rub  Abd--corpulent, nontender. Some indurated areas on ant abd wall--these look better acc. to mother and husband  Extr--trace edema  I/O yesterday 1080/1150 Weights are unreliable.    Lab Results:   Lab 11/27/12 0500 11/26/12 0645 11/25/12 1621  NA 141 139 141  K 4.0 4.0 4.1  CL 105 103 104  CO2 25 25 25   BUN 59* 59* 61*  CREATININE 3.59* 3.74* 3.79*  ALB -- -- --  GLUCOSE 91 -- --  CALCIUM 7.6* 7.8* 7.6*  PHOS 4.2 4.9* 4.4     Basename 11/27/12 0500 11/26/12 0645  WBC 8.8 10.5  HGB 7.8* 7.9*  HCT 25.6* 25.6*  PLT 271 260     I have reviewed the patient's current medications. Scheduled:   . calcium carbonate  1,000 mg of elemental calcium Oral QHS  . carvedilol  6.25 mg Oral BID WC  . darbepoetin (ARANESP) injection - NON-DIALYSIS  200 mcg Subcutaneous Q Thu-1800  . dextrose  1 ampule Intravenous Once  . DULoxetine  60 mg Oral Daily  . feeding supplement  1 Container Oral BID BM  . furosemide  40 mg Oral BID  . insulin aspart  0-20 Units Subcutaneous TID WC  . levothyroxine  25 mcg Oral QAC breakfast  . saccharomyces boulardii  250 mg Oral BID  . terbinafine   Topical Daily    Assessment/Plan: 1. AKI supperimposed on CKD (baseline Cr 2.76  Oct 2013). Cr was 8.98 on admission. Cr starting to fall now  2. Obstructed R ureter--s/p removal of L ureteral stent, removal of R Renal stone, and placement of R ureteral stent by Dr. Jasmine December on 16 Nov 2012.  3. Anemia--received feraheme 510 x 2 last week. On  aranesp 200/wk--hgb 7.8 today 4. Pneumonia--better on Ceftriaxone  5. E coli bacteremia- " " "  6. DM-2--per primary service  7. 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 On sensipar, po CaCO3, and IV paracalcitol. Will change to po paracalcito.  Ca  7.6 today with alb 1.9 (corrected Ca 9.2) 8. Hearing loss-- ENT saw and made recommendations--better today  9.  Immobility--need PT to mobilize    LOS: 17 days   Phenix Vandermeulen F 11/27/2012,1:39 PM   .labalb

## 2012-11-27 NOTE — Progress Notes (Signed)
PROGRESS NOTE  [name] [id number] DOB: 1961/04/26 DOA: 11/10/2012 PCP: [name], MD  Brief narrative: 52 years old morbid obese female with complex PMH relevant for systolic CHF with LVEF of 35 to 40%, HTN, DM2, hypothyroidism, chronic pain on narcotics and CKD being evaluated by nephrology for dialysis. She also has a complex history of suppurative hidradenitis and has been on chronic antibiotic therapy per ID. She was thought to [facility] [place] 12/15 because she was hypoxic in respiratory distress and had altered mental status and was lethargic. She required intubation mechanical ventilation due to bradycardic arrest-please see events below  Past medical history-As per Problem list Chart reviewed as below-  Chronic hidradenitis + leukocytosis-seen at [facility], seen by infectious disease 01/19/12 admission   admitted 04/04/12 for symptomatic anemia hemoglobin 6.7   Presumed nonischemic cardiomyopathy last EF 35%-40% 04/06/12   Chronic kidney disease stage III, followed by nephrology baseline creatinine 2.4   Type 2 diabetes mellitus, last A1c 8.5   Acute on chronic systolic congestive heart failure-admitted 01/19/12, Lasix 60 twice a day given.   Admit 08/16/11-status post 4 units packed red blood cells   History of hypothyroidism, ? MNG   Morbid obesity   Hyperlipidemia   Gout   Evaluated 02/08/11 by plastic surgery at [place]:  12/15 vanc>>>12/15  12/15 zosyn>>>12/17  12/16 levofloxacin>>>12/17  12/16 ceftriaxone>>>12/29 (planned)   Cultures:  12/15 BC x 2>>>E coli sens ceftr  12/15 urine>>>E coli (variable res)  12/15 tracheal >>>NF   Lines:  12/15 left IJ >>>  12/15 Rt rad>>>12/19  12/15 ETT>>>12/25  12/16 rt fem HD >>>12/25   Events/ Studeis:  12/15 >>> Arrest  12/15 renal US>>Suboptimal examination. There may be mild pelvicaliectasis on the<BR>right.<BR> <BR>Left kidney not visualized  12/16>>>ARF, shock, started on cvvhd  12/16  echo - 55% EF, mod reduced rv fxn, PA 46  12/17- pressors better, K resolved  12/17 CT chest- bibasilr small infiltrates / atx  12/17 CT abdo/pelvis>>>Left ureteral stent placement following removal of left  ureteral calculi. No residual left-sided hydronephrosis or ureteral stone fragments. Apparent lithotripsy of the right renal calculi with obstructing 10 mm stone fragment at the right ureteral pelvic junction. There are small renal calyceal stone fragments bilaterally. Anasarca  12/18- off pressors, weaning, awake  12/18- urology called, remains off pressors, failed weaning  12/20- neg balance  12/21 >tranx [facility] for uro proced>Cystoscopy, left ureter stent removal, bilateral retrograde pyelogram, right ureter stent placement  12/24 bscopy for RUL atx     Subjective  Patient wishes to go home, however, she does not have 24 hour supervision and is very weak and deconditioned.  Was only able to walk a few steps with physical therapy and needed encouragement from her family and from PT to even start to participate.  Denies fever, chills, chest pain, diarrhea, nausea.  Endorses left lower extremity pain in the quadricept and calf.      Objective    Objective: Filed Vitals:   11/26/12 1415 11/26/12 1717 11/26/12 2116 11/27/12 0541  BP: 104/63 116/69 125/62 146/73  Pulse: 72 76 68 75  Temp: 99 F (37.2 C) 98.8 F (37.1 C) 97.6 F (36.4 C) 97.3 F (36.3 C)  TempSrc: Oral Oral Oral Oral  Resp: 18 18 18 18   Height:   5\' 4"  (1.626 m)   Weight:   162.6 kg (358 lb 7.5 oz)   SpO2: 97% 96% 98% 100%    Intake/Output Summary (Last 24 hours)  at 11/27/12 1108 Last data filed at 11/27/12 0700  Gross per 24 hour  Intake    960 ml  Output   1150 ml  Net   -190 ml    Exam:  General: Obese pleasant African American female in no apparent distress, lying in bed.  Communicated via writing information on paper.   Cardiovascular: S1-S2 no murmur rub or gallop Respiratory:  CTAB Abdomen: Soft  nontender nondistended Skin hidradenitis scars over abdomen under breasts-she has areas in the mons pubis and bilateral intertriginous area is that have all scars of her tendinitis.  Some induration but no significant erythema or pustular areas.   Neuro:  hearing is significantly impaired  Data Reviewed: Basic Metabolic Panel:  Lab [id number] 0500 11/26/12 0645 11/25/12 1621 11/25/12 0922 11/25/12 0635  NA 141 139 141 141 141  K 4.0 4.0 -- -- --  CL 105 103 104 104 103  CO2 25 25 25 26 23   GLUCOSE 91 100* 116* 122* 89  BUN 59* 59* 61* 61* 60*  CREATININE 3.59* 3.74* 3.79* 3.63* 3.80*  CALCIUM 7.6* 7.8* 7.6* 7.8* 7.9*  MG -- -- -- -- --  PHOS 4.2 4.9* 4.4 4.5 4.5   Liver Function Tests:  Lab 11/27/12 0500 11/26/12 0645 11/25/12 1621 11/25/12 0922 11/25/12 0635 11/21/12 0445  AST -- -- -- -- -- 10  ALT -- -- -- -- -- <5  ALKPHOS -- -- -- -- -- 90  BILITOT -- -- -- -- -- 0.3  PROT -- -- -- -- -- 7.0  ALBUMIN 1.9* 2.1* 2.1* 2.2* 2.2* --   No results found for this basename: LIPASE:5,AMYLASE:5 in the last 168 hours No results found for this basename: AMMONIA:5 in the last 168 hours CBC:  Lab 11/27/12 0500 11/26/12 0645 11/25/12 0922 11/25/12 0635 11/24/12 1234  WBC 8.8 10.5 11.1* 9.9 11.8*  NEUTROABS -- -- 9.1* 8.0* 10.0*  HGB 7.8* 7.9* 7.9* 8.0* 7.7*  HCT 25.6* 25.6* 25.8* 26.3* 25.3*  MCV 93.1 94.1 93.1 93.9 92.3  PLT 271 260 261 277 252   Cardiac Enzymes: No results found for this basename: CKTOTAL:5,CKMB:5,CKMBINDEX:5,TROPONINI:5 in the last 168 hours BNP: No components found with this basename: POCBNP:5 CBG:  Lab 11/27/12 0731 11/26/12 2120 11/26/12 1646 11/26/12 1142 11/26/12 0921  GLUCAP 78 112* 118* 123* 107*    Recent Results (from the past 240 hour(s))  CULTURE, BAL-QUANTITATIVE     Status: Normal   Collection Time   11/19/12 11:40 AM      Component Value Range Status Comment   Specimen Description BRONCHIAL ALVEOLAR LAVAGE   Final    Special Requests  Normal   Final    Gram Stain     Final    Value: FEW WBC PRESENT,BOTH PMN AND MONONUCLEAR     FEW SQUAMOUS EPITHELIAL CELLS PRESENT     NO ORGANISMS SEEN   Colony Count 80,000 COLONIES/ML   Final    Culture Non-Pathogenic Oropharyngeal-type Flora Isolated.   Final    Report Status 11/22/2012 FINAL   Final      Studies:              All Imaging reviewed and is as per above notation   Scheduled Meds:    . calcium carbonate  1,000 mg of elemental calcium Oral QHS  . carvedilol  6.25 mg Oral BID WC  . darbepoetin (ARANESP) injection - NON-DIALYSIS  200 mcg Subcutaneous Q Thu-1800  . dextrose  1 ampule Intravenous Once  .  DULoxetine  60 mg Oral Daily  . feeding supplement  1 Container Oral BID BM  . insulin aspart  0-20 Units Subcutaneous TID WC  . levothyroxine  25 mcg Oral QAC breakfast  . saccharomyces boulardii  250 mg Oral BID  . terbinafine   Topical Daily   Continuous Infusions:    Assessment/Plan: 1. Acute respiratory failure with hypoxia-currently resolved. Continue BiPAP at night. She had a bronchoscopy and secretions of right upper lobe atelectasis were removed at that time on the 24th-see below 2. Severe septic shock 2/2 Ecoli status post left ureteric stent removal, right ureter stent placement 12/21+ bilateral pneumonia bases-antibiotics were managed and weaned per CCM. complete 14 days therapy on 12/29 3. Leukocytosis-chronic and thought probably to be secondary to her hidradenitis.  Resolved.  Chest x-ray 12/30 showed just cardiomegaly and vascular congestion which is improved 4. Acute renal failure-secondary to shock. Nephrology managing. Creatinine currently stable at around 3.0.  Made 1150 uop yesterday.   -  D/c foley when able to urinate in bedside commode while still closely trending uop 5. Anemia-multifactorial-AoCD/renal parenchymal disease. Hgb at baseline between 7 and 9. Tx on the 19th and again on the 26th.  Darbepoetin 200 mcg q week-she may need  transfusion dependent and hemoglobin -  Tx for hgb < 7 6. Diastolic HF, EF 123456, PA Peak 45%-hold diuretics at present. Has some left lower extremity edema.   -  Continue Coreg 6.25 bid.   -  Restart low dose lasix 40mg  BID (patient normally on 240mg  BID >> may be contributing to hearing loss) 7. DM ty 2- FS 78 - 123.  Continue dysphagia 3 diet, Nepro Carb steady state 8. Dysphagia-continue to thicken liquids 9. Chronic hidradenitis-Might need suppressive therapy-wounds appear non infected at present 10. Depression-continue Cymbalta 60 mg daily 11. Chronic pain continue Percocet 5/225 every 4 when necessary, Flexeril 10 mg 3 times a day when necessary 12. Metabolic bone disease-see above 13. 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 Tympanometry/ Audiological evaluation ordered. Speech-language pathologist has been recommended to give patient a hearing aid to see how she does. 14. Hypothyroidism-continue levothyroxine 50 mcg every morning 15. LLE pain and swelling:  Duplex LLE  DIET:  Dysphagia 3 with thin IVF:  Off PROPH:  SCDs   Code Status: Full Family Communication: Spoke only with patient.  Encouraged her to work with physical therapy so that we may safely be able to remove foley and document urine output.   Disposition Plan: Unclear baseline of kidney function and trending down with good uop.  Placed consult for [place], MD  [facility] Pager [phone] 11/27/2012, 11:08 AM    LOS: 17 days

## 2012-11-27 NOTE — Progress Notes (Signed)
*  Preliminary Results* Left lower extremity venous duplex completed. Study was technically limited due to patient body habitus and depth of vessels. Unable to visualize most of the left lower extremity veins due to technical limitations, therefore unable to exclude deep vein thrombosis.  Due to limitations, study was inconclusive.  11/27/2012 4:58 PM Maudry Mayhew, RDMS, RDCS

## 2012-11-28 ENCOUNTER — Inpatient Hospital Stay (HOSPITAL_COMMUNITY): Payer: BC Managed Care – PPO

## 2012-11-28 LAB — CBC
HCT: 28.8 % — ABNORMAL LOW (ref 36.0–46.0)
Hemoglobin: 8.6 g/dL — ABNORMAL LOW (ref 12.0–15.0)
MCHC: 29.9 g/dL — ABNORMAL LOW (ref 30.0–36.0)
RDW: 18.2 % — ABNORMAL HIGH (ref 11.5–15.5)
WBC: 9.4 10*3/uL (ref 4.0–10.5)

## 2012-11-28 LAB — RENAL FUNCTION PANEL
CO2: 26 mEq/L (ref 19–32)
Calcium: 8.1 mg/dL — ABNORMAL LOW (ref 8.4–10.5)
GFR calc Af Amer: 17 mL/min — ABNORMAL LOW (ref >90)
GFR calc non Af Amer: 14 mL/min — ABNORMAL LOW (ref >90)
Glucose, Bld: 86 mg/dL (ref 70–99)
Phosphorus: 4.4 mg/dL (ref 2.3–4.6)
Potassium: 3.8 mEq/L (ref 3.5–5.1)
Sodium: 141 mEq/L (ref 135–145)

## 2012-11-28 LAB — GLUCOSE, CAPILLARY: Glucose-Capillary: 114 mg/dL — ABNORMAL HIGH (ref 70–99)

## 2012-11-28 NOTE — Progress Notes (Signed)
Palliative Medicine Team Progress Note   S: Patient "better". Still having pain from skin lesions. Immobility and generalized weakness persist. She was able to get OOB today-but had to go back to bed due to pain in her buttock and groin from HAS. Struggling with her discharge plan.  Filed Vitals:   11/28/12 1346  BP: 124/72  Pulse: 85  Temp: 98.4 F (36.9 C)  Resp: 18    Obese, very HOH, Alert, Oriented to person and place and time- some mild confusion about her illness, poor insight and judgement into her condition. Lungs clear. RRR, no mrg. Soft Abdomen, lesions red , various stages of healing and scarring  Assessment: 52 yo with multiple chronic medical problems and morbid obesity s/p respiratory failure and bradycardic arrest with mild anoxic brain injury. Very poor functional status. Dischareg is challenging, she is refusing rehab adamantly.    Plan: 1. Long acting low level pain control-caution with OHS and Respiratory issues was on opiates prior to admission. 2. Full code 3. Refuses rehab, will only consider going home, family making arrangements for caregivers, she is multi-person assist and severe limited in mobility- difficult situation.  There are no additional PMT needs, will sign off, call with questions or additional needs.   Time: 35 minutes   Lane Hacker, DO Palliative Medicine

## 2012-11-28 NOTE — Progress Notes (Signed)
PROGRESS NOTE  [name] [id number] DOB: 1961/06/13 DOA: 11/10/2012 PCP: [name], MD  Brief narrative: 52 years old morbid obese female with complex PMH relevant for systolic CHF with LVEF of 35 to 40%, HTN, DM2, hypothyroidism, chronic pain on narcotics and CKD being evaluated by nephrology for dialysis. She also has a complex history of suppurative hidradenitis and has been on chronic antibiotic therapy per ID. She was thought to [facility] 12/15 because she was hypoxic in respiratory distress and had altered mental status and was lethargic. She required intubation mechanical ventilation due to bradycardic arrest-please see events below  Past medical history-As per Problem list Chart reviewed as below-  Chronic hidradenitis + leukocytosis-seen at [facility], seen by infectious disease 01/19/12 admission   admitted 04/04/12 for symptomatic anemia hemoglobin 6.7   Presumed nonischemic cardiomyopathy last EF 35%-40% 04/06/12   Chronic kidney disease stage III, followed by nephrology baseline creatinine 2.4   Type 2 diabetes mellitus, last A1c 8.5   Acute on chronic systolic congestive heart failure-admitted 01/19/12, Lasix 60 twice a day given.   Admit 08/16/11-status post 4 units packed red blood cells   History of hypothyroidism, ? MNG   Morbid obesity   Hyperlipidemia   Gout   Evaluated 02/08/11 by plastic surgery at [place]:  12/15 vanc>>>12/15  12/15 zosyn>>>12/17  12/16 levofloxacin>>>12/17  12/16 ceftriaxone>>>12/29 (planned)   Cultures:  12/15 BC x 2>>>E coli sens ceftr  12/15 urine>>>E coli (variable res)  12/15 tracheal >>>NF   Lines:  12/15 left IJ >>>  12/15 Rt rad>>>12/19  12/15 ETT>>>12/25  12/16 rt fem HD >>>12/25   Events/ Studeis:  12/15 >>> Arrest  12/15 renal US>>Suboptimal examination. There may be mild pelvicaliectasis on the<BR>right.<BR> <BR>Left kidney not visualized  12/16>>>ARF, shock, started on cvvhd  12/16  echo - 55% EF, mod reduced rv fxn, PA 46  12/17- pressors better, K resolved  12/17 CT chest- bibasilr small infiltrates / atx  12/17 CT abdo/pelvis>>>Left ureteral stent placement following removal of left  ureteral calculi. No residual left-sided hydronephrosis or ureteral stone fragments. Apparent lithotripsy of the right renal calculi with obstructing 10 mm stone fragment at the right ureteral pelvic junction. There are small renal calyceal stone fragments bilaterally. Anasarca  12/18- off pressors, weaning, awake  12/18- urology called, remains off pressors, failed weaning  12/20- neg balance  12/21 >tranx WL for uro proced>Cystoscopy, left ureter stent removal, bilateral retrograde pyelogram, right ureter stent placement  12/24 bscopy for RUL atx     Subjective  Patient needs multiple person assist to get to the bedside commode and continues to have foley catheter.  Denies fever, chills, chest pain, diarrhea, nausea.  Pain in left lower extremity pain in the quadricept and calf now resolved.  Has pain in her left wrist.      Objective    Objective: Filed Vitals:   11/27/12 2051 11/27/12 2140 11/28/12 0455 11/28/12 1008  BP: 131/70  158/81 125/73  Pulse: 75 75 80 76  Temp: 98.5 F (36.9 C)  98.6 F (37 C) 98.1 F (36.7 C)  TempSrc: Axillary  Axillary Oral  Resp: 18 20 20 18   Height:      Weight: 163 kg (359 lb 5.6 oz)     SpO2: 95% 95% 96% 95%    Intake/Output Summary (Last 24 hours) at 11/28/12 1203 Last data filed at 11/28/12 0900  Gross per 24 hour  Intake    480 ml  Output  500 ml  Net    -20 ml    Exam:  General: Obese pleasant African American female in no apparent distress, lying in bed.  Communicated via writing information on paper.   Cardiovascular: S1-S2 no murmur rub or gallop Respiratory:  CTAB Abdomen: Soft nontender nondistended Skin hidradenitis scars over abdomen under breasts-she has areas in the mons pubis and bilateral intertriginous area is  that have all scars of her tendinitis.  Some induration but no significant erythema or pustular areas.   Neuro:  hearing is significantly impaired MSK:  No pain with palpation of the left quadricepts or knee or calf today.    Data Reviewed: Basic Metabolic Panel:  Lab [id number] 11/27/12 0500 11/26/12 0645 11/25/12 1621 11/25/12 0922  NA 141 141 139 141 141  K 3.8 4.0 -- -- --  CL 104 105 103 104 104  CO2 26 25 25 25 26   GLUCOSE 86 91 100* 116* 122*  BUN 55* 59* 59* 61* 61*  CREATININE 3.49* 3.59* 3.74* 3.79* 3.63*  CALCIUM 8.1* 7.6* 7.8* 7.6* 7.8*  MG -- -- -- -- --  PHOS 4.4 4.2 4.9* 4.4 4.5   Liver Function Tests:  Lab 11/28/12 0502 11/27/12 0500 11/26/12 0645 11/25/12 1621 11/25/12 0922  AST -- -- -- -- --  ALT -- -- -- -- --  ALKPHOS -- -- -- -- --  BILITOT -- -- -- -- --  PROT -- -- -- -- --  ALBUMIN 2.1* 1.9* 2.1* 2.1* 2.2*   No results found for this basename: LIPASE:5,AMYLASE:5 in the last 168 hours No results found for this basename: AMMONIA:5 in the last 168 hours CBC:  Lab 11/28/12 0502 11/27/12 0500 11/26/12 0645 11/25/12 0922 11/25/12 0635 11/24/12 1234  WBC 9.4 8.8 10.5 11.1* 9.9 --  NEUTROABS -- -- -- 9.1* 8.0* 10.0*  HGB 8.6* 7.8* 7.9* 7.9* 8.0* --  HCT 28.8* 25.6* 25.6* 25.8* 26.3* --  MCV 95.0 93.1 94.1 93.1 93.9 --  PLT 311 271 260 261 277 --   Cardiac Enzymes: No results found for this basename: CKTOTAL:5,CKMB:5,CKMBINDEX:5,TROPONINI:5 in the last 168 hours BNP: No components found with this basename: POCBNP:5 CBG:  Lab 11/28/12 1142 11/28/12 0731 11/27/12 1731 11/27/12 1126 11/27/12 0731  GLUCAP 136* 89 105* 90 78    Recent Results (from the past 240 hour(s))  CULTURE, BAL-QUANTITATIVE     Status: Normal   Collection Time   11/19/12 11:40 AM      Component Value Range Status Comment   Specimen Description BRONCHIAL ALVEOLAR LAVAGE   Final    Special Requests Normal   Final    Gram Stain     Final    Value: FEW WBC PRESENT,BOTH PMN  AND MONONUCLEAR     FEW SQUAMOUS EPITHELIAL CELLS PRESENT     NO ORGANISMS SEEN   Colony Count 80,000 COLONIES/ML   Final    Culture Non-Pathogenic Oropharyngeal-type Flora Isolated.   Final    Report Status 11/22/2012 FINAL   Final      Studies:              All Imaging reviewed and is as per above notation   Scheduled Meds:    . calcium carbonate  1,000 mg of elemental calcium Oral QHS  . carvedilol  6.25 mg Oral BID WC  . darbepoetin (ARANESP) injection - NON-DIALYSIS  200 mcg Subcutaneous Q Thu-1800  . dextrose  1 ampule Intravenous Once  . DULoxetine  60 mg Oral Daily  .  feeding supplement  1 Container Oral BID BM  . furosemide  40 mg Oral BID  . insulin aspart  0-20 Units Subcutaneous TID WC  . levothyroxine  25 mcg Oral QAC breakfast  . saccharomyces boulardii  250 mg Oral BID  . terbinafine   Topical Daily   Continuous Infusions:    Assessment/Plan: 1. Acute respiratory failure with hypoxia 2/2 pneumonia, resolved. Patient on room air -  Continue BiPAP at night.  2. Severe septic shock 2/2 Ecoli status post left ureteric stent removal, right ureter stent placement 12/21+ bilateral pneumonia bases, s/p 14 days of antibiotics on 12/29 3. Leukocytosis-chronic and thought probably to be secondary to her hidradenitis.  Resolved.  Chest x-ray 12/30 showed just cardiomegaly and vascular congestion which is improved 4. Acute renal failure-secondary to shock, creatinine continues to trend down.  Made 522mL urine yesterday -  Appreciate Nephrology assistance -  D/c foley when able to urinate in bedside commode while still closely trending uop 5. Anemia-multifactorial-AoCD/renal parenchymal disease. Hgb baseline 7-9mg /dl. Transfused on the 19th and again on the 26th.   -  Darbepoetin 200 mcg q week-she may need transfusion dependent and hemoglobin -  Tx for hgb < 7 6. Diastolic HF, EF [id number], PA Peak 45%-hold diuretics at present. Has some left lower extremity edema.   -   Continue Coreg 6.25 bid.   -  Increase lasix to 120 mg BID (patient normally on 240mg  BID >> may be contributing to hearing loss) 7. DM ty 2- FS 78 - 123.  Continue dysphagia 3 diet, Nepro Carb steady state 8. Dysphagia, resolved - now on thin liquids 9. Chronic hidradenitis-Might need suppressive therapy-wounds appear non infected at present and WBC down 10. Depression-continue Cymbalta 60 mg daily 11. Chronic pain continue Percocet 5/325 every 4 when necessary, Flexeril 10 mg 3 times a day when necessary 12. Metabolic bone disease-see above 13. 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 Tympanometry/ Audiological evaluation ordered. Speech-language pathologist has been recommended to give patient a hearing aid to see how she does. 14. Hypothyroidism-continue levothyroxine 50 mcg every morning 15. LLE pain, resolved:  Duplex LLE unable to visualize all veins, but veins that were visualized were compressible and pain resolved today.  If worsens, consider repeating duplex.    DIET:  Dysphagia 3 with thin IVF:  Off PROPH:  SCDs   Code Status: Full Family Communication: Spoke only with patient. Disposition Plan:  SNF placement discussed with patient and social work.  Patient has previously not liked the option of SNF, but is unsafe to be in a place that does not have 24 hour supervision with adequate level of assistance to help with transfers.  Her husband is not at home all the time and one person is likely unable to assist her with basic needs.  Not a candidate for CIR because she is unable to participate in PT for up to 3 hours per day.     Janece Canterbury, MD  [facility] Pager [phone] 11/28/2012, 12:03 PM    LOS: 18 days

## 2012-11-28 NOTE — Care Management (Signed)
Pt refuses cpap. Will monotor

## 2012-11-28 NOTE — Progress Notes (Signed)
Physical Therapy Treatment Patient Details Name: [name] MRN: [id number] DOB: 12/16/1960 Today's Date: 11/28/2012 Time: [id number] PT Time Calculation (min): 23 min  PT Assessment / Plan / Recommendation Comments on Treatment Session  Pt. progressing her mobility today and was able to walk 6 feet.  Pt and family educated/encouraged that pt. will benefit from being up for all meals and to request assist from nursing staff with this.    Follow Up Recommendations  CIR     Does the patient have the potential to tolerate intense rehabilitation     Barriers to Discharge        Equipment Recommendations  Rolling walker with 5" wheels;Wheelchair (measurements PT);Wheelchair cushion (measurements PT)    Recommendations for Other Services Rehab consult  Frequency Min 3X/week   Plan Discharge plan remains appropriate;Frequency remains appropriate    Precautions / Restrictions Precautions Precautions: Fall Precaution Comments: morbid obesity Restrictions Weight Bearing Restrictions: No   Pertinent Vitals/Pain Pain in left wrist and buttocks, not rated.  Pt. Positioned from comfort at end of session    Mobility  Bed Mobility Bed Mobility: Sit to Supine Sit to Supine: 1: +2 Total assist Sit to Supine: Patient Percentage: 30% Details for Bed Mobility Assistance: 2 assist to manage LEs anfd trunk for sit to supine Transfers Transfers: Sit to Stand;Stand to Sit Sit to Stand: 1: +2 Total assist;With upper extremity assist;From chair/3-in-1 Sit to Stand: Patient Percentage: 50% Stand to Sit: 1: +2 Total assist;To bed;With upper extremity assist Stand to Sit: Patient Percentage: 50% Details for Transfer Assistance: Pt. with difficulty pushing with left arm due to wrist pain, cues for technique and safety Ambulation/Gait Ambulation/Gait Assistance: 1: +2 Total assist Ambulation/Gait: Patient Percentage: 70% Ambulation Distance (Feet): 6 Feet Assistive device: Rolling  walker Ambulation/Gait Assistance Details: Needed cueing for safe technique and help to advance Rw due to left wrist soreness Gait Pattern: Step-through pattern;Trunk flexed Gait velocity: slowed    Exercises     PT Diagnosis:    PT Problem List:   PT Treatment Interventions:     PT Goals Acute Rehab PT Goals PT Goal: Sit to Supine/Side - Progress: Progressing toward goal PT Goal: Sit to Stand - Progress: Progressing toward goal PT Goal: Stand to Sit - Progress: Progressing toward goal PT Goal: Ambulate - Progress: Progressing toward goal  Visit Information  Last PT Received On: 11/28/12 Assistance Needed: +2    Subjective Data  Subjective: Pt. reports she is sore from sitting up for an hour in recliner   Cognition  Overall Cognitive Status: Appears within functional limits for tasks assessed/performed Arousal/Alertness: Awake/alert Orientation Level: Appears intact for tasks assessed Behavior During Session: [facility] for tasks performed    Balance     End of Session PT - End of Session Equipment Utilized During Treatment: Gait belt Activity Tolerance: Patient limited by pain;Patient limited by fatigue Patient left: in bed;with call bell/phone within reach;with family/visitor present Nurse Communication: Mobility status   GP     Ladona Ridgel 11/28/2012, 3:01 PM Gerlean Ren PT Acute Rehab Services [place] [phone]

## 2012-11-28 NOTE — Progress Notes (Signed)
[facility]  Subjective:  Awake, alert, still hard of hearing   Objective: Vital signs in last 24 hours: Blood pressure 125/73, pulse 76, temperature 98.1 F (36.7 C), temperature source Oral, resp. rate 18, height 5\' 4"  (1.626 m), weight 163 kg (359 lb 5.6 oz), SpO2 95.00%.    PHYSICAL EXAM General--as above  Chest--clear  Heart--no rub  Abd--corpulent, nontender. Some indurated areas on ant abd wal Extr--trace edema    Lab Results:   Lab 11/28/12 0502 11/27/12 0500 11/26/12 0645  NA 141 141 139  K 3.8 4.0 4.0  CL 104 105 103  CO2 26 25 25   BUN 55* 59* 59*  CREATININE 3.49* 3.59* 3.74*  ALB -- -- --  GLUCOSE 86 -- --  CALCIUM 8.1* 7.6* 7.8*  PHOS 4.4 4.2 4.9*     Basename 11/28/12 0502 11/27/12 0500  WBC 9.4 8.8  HGB 8.6* 7.8*  HCT 28.8* 25.6*  PLT 311 271     I have reviewed the patient's current medications. Scheduled:   . calcium carbonate  1,000 mg of elemental calcium Oral QHS  . carvedilol  6.25 mg Oral BID WC  . darbepoetin (ARANESP) injection - NON-DIALYSIS  200 mcg Subcutaneous Q Thu-1800  . dextrose  1 ampule Intravenous Once  . DULoxetine  60 mg Oral Daily  . feeding supplement  1 Container Oral BID BM  . furosemide  40 mg Oral BID  . insulin aspart  0-20 Units Subcutaneous TID WC  . levothyroxine  25 mcg Oral QAC breakfast  . saccharomyces boulardii  250 mg Oral BID  . terbinafine   Topical Daily   Assessment/Plan: 1. AKI supperimposed on CKD (baseline Cr 2.76 Oct 2013). Cr was 8.98 on admission. Cr starting to fall now a little each day.  Not sure if she'll return to baseline of ~ 2 2. Obstructed R ureter--s/p removal of L ureteral stent, removal of R Renal stone, and placement of R ureteral stent by Dr. Jasmine December on 16 Nov 2012.  3. Anemia--received feraheme 510 x 2 last week. On aranesp 200/wk--hgb 8.6 today  4. Pneumonia--better on Ceftriaxone  5. E coli bacteremia- " " "  6. DM-2--per primary service  7. Secondary  PTH--PTH 662. On sensipar, po CaCO3, and IV paracalcitol. Will change to po paracalcitol. Ca 8.1 today with alb 1.9 (corrected Ca 9.7)  8. Hearing loss-- ENT saw and made recommendations--not much better today  9. Immobility--need PT to mobilize     LOS: 18 days   Malaak Stach F 11/28/2012,11:14 AM   .labalb

## 2012-11-28 NOTE — Discharge Summary (Signed)
Physician Discharge Summary  [name] [id number] DOB: 13-Jun-1961 DOA: 11/10/2012  PCP: Shelley Schwalbe, MD  Admit date: 11/10/2012 Discharge date: 12/03/2012  Recommendations for Outpatient Follow-up:  1. To home with family assistance 2. BMP with calcium, magnesium, and phosphorus, and CBC in 1 week, results to be sent to [facility] for follow up of creatinine and anemia 3. F/u Dr. Jasmine [name] in 2 weeks regarding right ureter stent 4. Ongoing physical, occupational, and speech therapy 5. F/u with primary care doctor within on week for follow up hidradenitis and check fingerstick.    Discharge Diagnoses:  Principal Problem:  *Sepsis Active Problems:  Morbid obesity  Mood disorder in conditions classified elsewhere  GERD  DM type 2 (diabetes mellitus, type 2)  Hidradenitis suppurativa  Anemia  ARF (acute renal failure)  Atelectasis  Hypertension   Discharge Condition: stable, improving  Diet recommendation: Dysphagia 3 with thin liquids, diabetic diet  Wt Readings from Last 3 Encounters:  12/02/12 156.2 kg (344 lb 5.7 oz)  12/02/12 156.2 kg (344 lb 5.7 oz)  08/22/12 166.3 kg (366 lb 10 oz)    History of present illness:   52 years old morbid obese female with complex PMH relevant for systolic CHF with LVEF of 35 to 40%, HTN, DM2, hypothyroidism, chronic pain on narcotics and CKD being evaluated by nephrology for dialysis. She also has a complex history of suppurative hidradenitis and is on chronic antibiotic therapy. Presents with one week history of increased fatigue and somnolence. Over the last 3 days she experienced progressive SOB. Her oral intake also decreased because of altered mental status and was not taking her regular medications. Today she was very lethargic and her husband decided to call EMS. At arrival to the ED on respiratory distress and hypoxic. She required intubation and mechanical ventilation. Chest X ray showed a LLL pneumonia. At the  time of my exam the patient is intubated, sedated with BP borderline low  Hospital Course:   [name] presented with refractory septic shock and respiratory failure due to LLL pneumonia and E. Coli bacteremia from urinary tract infection.  She was admitted to the MICU and started on vancomycin and zosyn. Late in the evening on 12/15, she became hypotensive and was started on vasopressors and IVF.  Right after MN, she became asystolic and pulseless.  Chest compressions were started and she was given epinephrine, calcium, bicarb.  She had ROSC after 3 minutes of resuscitation.  Post-arrest, her course was complicated by electrolyte derangement with hyperkalemia and anemia requiring blood transfusion.  After several attempts, an HD catheter was placed on 12/16 and she was started on CVVHD due to acute renal failure. CT of the chest/abd/and pelvis on 12/17 demonstrated bibasilar infiltrates, left ureteral stent, and a 24mm right renal calculi obstructing the right ureteral junction.  Urology was consulted.  Her vasopressors were weaned on 12/18.  She was transitioned to ceftriaxone which continued until until 12/29 based on culture sensitivities.   One 12/21, she underwant cystoscopy with left ureter stent removal, bilateral retrograde pyelogram, and right ureteral stent placement.  Due to inability to wean the ventilator, she underwent bronchoscopy on 12/24.  She had secretions which were suctioned which enabled her to be extubated on 12/25.  She continued bipap at night.  She gradually regained kidney function and did not require intermittent hemodialysis.    Acute kidney injury and renal failure:  She regained renal function and did not end up requiring permanent HD.   Anemia-multifactorial-AoCD/renal parenchymal  disease.  Her baseline hemoglobin is between 7 & 9mg /dl.  She was given multiple PRBC transfusion and was started on intermittent epo, which may be addressed as an outpatient.  Her hemoglobin  rose  Hx of systolic CHF, however recent ECHO with EF 60-65%, PA Peak 45%.  Her diuretics were initially held for acute illness, but restarted as she regained kidney function and regained some lower extremity edema suggesting mild hypervolemia.  She was restarted on beta blocker, however, no ACEI/ARB due to recent history of AKI.    DM ty 2- FS well controlled on 5-6 units of insulin daily. Continue dysphagia 3 diet, Nepro Carb steady state.  Will discontinue insulin at discharge and have patient follow up with her primary care doctor later this week for a fingerstick check.   Dysphagia- continue to thicken liquids Chronic hidradenitis- Stable. Depression-continue Cymbalta 60 mg daily Chronic pain:  Restarted Percocet 5/325 q4h prn, Flexeril 10 mg TID prn, and added tramadol scheduled for basal pain control. 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 Tympanometry/ Audiological evaluation was ordered. Speech-language pathologist has been recommended to give patient a hearing aid to see how she does. Hypothyroidism-continued levothyroxine daily  Discharge planning:  Patient was evaluated by PT and OT who recommended skilled nursing facility or inpatient rehab.  The patient was assessed by CIR who refused admission due to poor compliance with getting out of the bed or working with therapy.  I personally spoke with the patient about nursing homes as did PT and SW, however, the patient repeatedly and adamantly refused skilled nursing facility even for rehab.  Arranged for maximum support at home, including PT/OT/SLP, aid, RN, bed, wheelchair, walker, 3-in-1.  Mother is going to stay with her.  She has no steps to navigate.  Husband will also be around when not at work and there are other family members who will be able to assist also.    Abx:  12/15 vanc>>>12/15  12/15 zosyn>>>12/17  12/16 levofloxacin>>>12/17  12/16 ceftriaxone>>>12/29  Cultures:  12/15 BC x 2>>>E coli  sens ceftr  12/15 urine>>>E coli (variable res)  12/15 tracheal >>>NF   Lines:  12/15 left IJ 12/15 Rt rad>>>12/19  12/15 ETT>>>12/25  12/16 rt fem HD >>>12/25   Events/ Studies:  12/15 >>> Arrest  12/15 renal US>>Suboptimal examination. There may be mild pelvicaliectasis on the<BR>right.<BR> <BR>Left kidney not visualized  12/16>>>ARF, shock, started on cvvhd  12/16 echo - 55% EF, mod reduced rv fxn, PA 46  12/17- pressors better, K resolved  12/17 CT chest- bibasilr small infiltrates / atx  12/17 CT abdo/pelvis>>>Left ureteral stent placement following removal of left  ureteral calculi. No residual left-sided hydronephrosis or ureteral stone fragments. Apparent lithotripsy of the right renal calculi with obstructing 10 mm stone fragment at the right ureteral pelvic junction. There are small renal calyceal stone fragments bilaterally. Anasarca  12/18- off pressors, weaning, awake  12/18- urology called, remains off pressors, failed weaning  12/20- neg balance  12/21 >tranx WL for uro proced>Cystoscopy, left ureter stent removal, bilateral retrograde pyelogram, right ureter stent placement  12/24 bscopy for RUL atx   Discharge Exam: Filed Vitals:   12/03/12 0922  BP: 162/81  Pulse: 81  Temp: 98 F (36.7 C)  Resp: 18   Filed Vitals:   12/02/12 1741 12/02/12 2057 12/03/12 0533 12/03/12 0922  BP: 130/89 135/71 146/83 162/81  Pulse: 102 80 83 81  Temp: 98.6 F (37 C) 98.3 F (36.8 C) 98.3 F (36.8  C) 98 F (36.7 C)  TempSrc: Oral Oral Oral Oral  Resp: 18 18 18 18   Height:      Weight:  156.2 kg (344 lb 5.7 oz)    SpO2: 98% 94% 92% 92%    General: Obese African American female in no apparent distress, hearing impaired.  Cardiovascular: S1-S2 no murmur rub or gallop  Respiratory: CTAB  Abdomen: Soft nontender nondistended  Skin:  Induration and skin thickening and scarring of the inguinal area, particularly on the left with some mild tenderness to palpation of the  area.  No erythema, warmth, or purulence noted MSK: 1 + bilateral pitting edema of lower extremities   Discharge Instructions      Discharge Orders    Future Orders Please Complete By Expires   Diet - low sodium heart healthy      Diet Carb Modified      Increase activity slowly      Discharge instructions      Comments:   You were hospitalized with septic shock secondary to urinary tract infection and kidney stone.  You had cardiac arrest and breathing failure.  You improved after urology was able to remove the stone and stent open your ureter and with a prolonged course of antibiotics.  Please take your medications as prescribed and read over the information provided carefully.  Please follow up with your primary care doctor later this week for a reevaluation and to answer any additional questions you may have about your hospitalization and ongoing medical problems.  You will need to follow up with urology within 2 weeks.  Please call to schedule an appointment.   Call MD for:  temperature >100.4      Call MD for:  persistant nausea and vomiting      Call MD for:  severe uncontrolled pain      Call MD for:  difficulty breathing, headache or visual disturbances      Call MD for:  hives      Call MD for:  persistant dizziness or light-headedness      Call MD for:  extreme fatigue      (HEART FAILURE PATIENTS) Call MD:  Anytime you have any of the following symptoms: 1) 3 pound weight gain in 24 hours or 5 pounds in 1 week 2) shortness of breath, with or without a dry hacking cough 3) swelling in the hands, feet or stomach 4) if you have to sleep on extra pillows at night in order to breathe.          Medication List     As of 12/03/2012 12:52 PM    STOP taking these medications         sevelamer 800 MG tablet   Commonly known as: RENVELA      sodium bicarbonate 650 MG tablet      TAKE these medications         aspirin 81 MG EC tablet   Take 1 tablet (81 mg total) by mouth  daily.      calcitRIOL 0.25 MCG capsule   Commonly known as: ROCALTROL   Take 1 capsule (0.25 mcg total) by mouth daily.      calcium carbonate 1250 MG tablet   Commonly known as: OS-CAL - dosed in mg of elemental calcium   Take 2 tablets (1,000 mg of elemental calcium total) by mouth daily.      carvedilol 25 MG tablet   Commonly known as: COREG   Take 1  tablet (25 mg total) by mouth 2 (two) times daily with a meal.      cyclobenzaprine 10 MG tablet   Commonly known as: FLEXERIL   Take 1 tablet (10 mg total) by mouth 3 (three) times daily as needed for muscle spasms. For Spasms      DULoxetine 60 MG capsule   Commonly known as: CYMBALTA   Take 1 capsule (60 mg total) by mouth daily.      feeding supplement Liqd   Take 1 Container by mouth 2 (two) times daily between meals.      ferrous sulfate 325 (65 FE) MG tablet   Take 1 tablet (325 mg total) by mouth 2 (two) times daily.      folic acid 1 MG tablet   Commonly known as: FOLVITE   Take 1 tablet (1 mg total) by mouth daily.      furosemide 80 MG tablet   Commonly known as: LASIX   Take 3 tablets (240 mg total) by mouth 2 (two) times daily.      isosorbide mononitrate 30 MG 24 hr tablet   Commonly known as: IMDUR   Take 1 tablet (30 mg total) by mouth daily.      levothyroxine 25 MCG tablet   Commonly known as: SYNTHROID, LEVOTHROID   Take 1 tablet (25 mcg total) by mouth every morning. Take on an empty stomach      magnesium oxide 400 MG tablet   Commonly known as: MAG-OX   Take 400 mg by mouth 2 (two) times daily.      oxyCODONE-acetaminophen 5-325 MG per tablet   Commonly known as: PERCOCET/ROXICET   Take 1 tablet by mouth every 6 (six) hours as needed. For pain      pravastatin 40 MG tablet   Commonly known as: PRAVACHOL   Take 1 tablet (40 mg total) by mouth daily.      saccharomyces boulardii 250 MG capsule   Commonly known as: FLORASTOR   Take 1 capsule (250 mg total) by mouth 2 (two) times daily.       sodium hypochlorite 0.125 % Soln   Commonly known as: DAKIN'S 1/4 STRENGTH   Irrigate with 1 application as directed daily as needed. Apply to wound with dressing changes      terbinafine 1 % cream   Commonly known as: LAMISIL   Apply topically daily.      traMADol 50 MG tablet   Commonly known as: ULTRAM   Take 1 tablet (50 mg total) by mouth every 12 (twelve) hours.      vitamin C 500 MG tablet   Commonly known as: ASCORBIC ACID   Take 500 mg by mouth daily.      zinc sulfate 220 MG capsule   Take 220 mg by mouth daily.         Follow-up Information    Follow up with Shelley Schwalbe, MD. In 4 days.   Contact information:   [place] [address] [phone]           The results of significant diagnostics from this hospitalization (including imaging, microbiology, ancillary and laboratory) are listed below for reference.    Significant Diagnostic Studies: Ct Abdomen Pelvis Wo Contrast  11/12/2012  *RADIOLOGY REPORT*  Clinical Data:  Respiratory distress with lethargy and renal insufficiency.  Evaluate for hydronephrosis, calculi and infiltrates.  CT CHEST, ABDOMEN AND PELVIS WITHOUT CONTRAST  Technique:  Multidetector CT imaging of the chest, abdomen and  pelvis was performed following the standard protocol without IV contrast.  Comparison:  Portable chest 11/12/2012, renal ultrasound 11/11/2012 and abdominal pelvic CT 05/25/2012.  CT CHEST  Findings:  Study remains limited by body habitus.  Endotracheal tube tip is in the mid trachea.  A nasogastric tube is in place. Left-sided central line extends into the upper SVC.  Within the limitations of noncontrast technique, no significantly enlarged mediastinal or hilar lymph nodes are demonstrated. There are mildly prominent axillary lymph nodes, measuring up to 1.6 cm Alesana Magistro axis on the left.  The heart is mildly enlarged.  There are trace bilateral pleural effusions.  There is diffuse soft tissue edema  throughout the chest wall.  There are streaky air space opacities dependently in both lower lobes.  There is also linear opacity in the left upper lobe. No air bronchograms are identified and there is no endobronchial lesion.  IMPRESSION:  1.  Streaky bibasilar and left upper lobe pulmonary opacities may reflect atelectasis or pneumonia. 2.  Small bilateral pleural effusions. 3.  Anasarca with diffuse soft tissue edema. 4.  Mildly prominent axillary lymph nodes bilaterally.  CT ABDOMEN AND PELVIS  Findings:  A double-J left ureteral stent has been placed in the interval.  There are no definite stone fragments along the stent within the left ureter.  There is no residual left-sided hydronephrosis.  A tiny calyceal calculus is noted in the lower pole of the left kidney on image 84.  The right upper pole renal calyceal calculi have been fragmented in the interval.  There is a 10 mm calculus fragment obstructing the right ureteral pelvic junction on image 80.  There is a 6 mm stone fragment in the lower pole calyces on image 84.  No definite right- sided ureteral calculi are seen. The right renal pelvis and calices are moderately dilated.  The liver, spleen, gallbladder, pancreas and adrenal glands appear unchanged without definite abnormalities.  No bowel lesions are identified.  Ascites and diffuse soft tissue edema have mildly worsened.  The bladder is decompressed by a Foley catheter.  There is a right femoral line extending into the pelvis.  There are several mildly enlarged retroperitoneal lymph nodes, measuring 11 mm Kiahna Banghart axis in the left periaortic region (image 90), 12 mm adjacent to the right common iliac vein on image 98, and 13 mm along the left pelvic sidewall (image 118).  These have not substantially changed.  IMPRESSION:  1.  Left ureteral stent placement following removal of left ureteral calculi.  No residual left-sided hydronephrosis or ureteral stone fragments. 2.  Apparent lithotripsy of the right  renal calculi with obstructing 10 mm stone fragment at the right ureteral pelvic junction. There are small renal calyceal stone fragments bilaterally. 3.  Increased anasarca with ascites and diffuse soft tissue edema. 4.  Stable nonspecific retroperitoneal and pelvic lymphadenopathy.   Original Report Authenticated By: Richardean Sale, M.D.    Ct Chest Wo Contrast  11/12/2012  *RADIOLOGY REPORT*  Clinical Data:  Respiratory distress with lethargy and renal insufficiency.  Evaluate for hydronephrosis, calculi and infiltrates.  CT CHEST, ABDOMEN AND PELVIS WITHOUT CONTRAST  Technique:  Multidetector CT imaging of the chest, abdomen and pelvis was performed following the standard protocol without IV contrast.  Comparison:  Portable chest 11/12/2012, renal ultrasound 11/11/2012 and abdominal pelvic CT 05/25/2012.  CT CHEST  Findings:  Study remains limited by body habitus.  Endotracheal tube tip is in the mid trachea.  A nasogastric tube is in place. Left-sided central  line extends into the upper SVC.  Within the limitations of noncontrast technique, no significantly enlarged mediastinal or hilar lymph nodes are demonstrated. There are mildly prominent axillary lymph nodes, measuring up to 1.6 cm Angelicia Lessner axis on the left.  The heart is mildly enlarged.  There are trace bilateral pleural effusions.  There is diffuse soft tissue edema throughout the chest wall.  There are streaky air space opacities dependently in both lower lobes.  There is also linear opacity in the left upper lobe. No air bronchograms are identified and there is no endobronchial lesion.  IMPRESSION:  1.  Streaky bibasilar and left upper lobe pulmonary opacities may reflect atelectasis or pneumonia. 2.  Small bilateral pleural effusions. 3.  Anasarca with diffuse soft tissue edema. 4.  Mildly prominent axillary lymph nodes bilaterally.  CT ABDOMEN AND PELVIS  Findings:  A double-J left ureteral stent has been placed in the interval.  There are no  definite stone fragments along the stent within the left ureter.  There is no residual left-sided hydronephrosis.  A tiny calyceal calculus is noted in the lower pole of the left kidney on image 84.  The right upper pole renal calyceal calculi have been fragmented in the interval.  There is a 10 mm calculus fragment obstructing the right ureteral pelvic junction on image 80.  There is a 6 mm stone fragment in the lower pole calyces on image 84.  No definite right- sided ureteral calculi are seen. The right renal pelvis and calices are moderately dilated.  The liver, spleen, gallbladder, pancreas and adrenal glands appear unchanged without definite abnormalities.  No bowel lesions are identified.  Ascites and diffuse soft tissue edema have mildly worsened.  The bladder is decompressed by a Foley catheter.  There is a right femoral line extending into the pelvis.  There are several mildly enlarged retroperitoneal lymph nodes, measuring 11 mm Fatim Vanderschaaf axis in the left periaortic region (image 90), 12 mm adjacent to the right common iliac vein on image 98, and 13 mm along the left pelvic sidewall (image 118).  These have not substantially changed.  IMPRESSION:  1.  Left ureteral stent placement following removal of left ureteral calculi.  No residual left-sided hydronephrosis or ureteral stone fragments. 2.  Apparent lithotripsy of the right renal calculi with obstructing 10 mm stone fragment at the right ureteral pelvic junction. There are small renal calyceal stone fragments bilaterally. 3.  Increased anasarca with ascites and diffuse soft tissue edema. 4.  Stable nonspecific retroperitoneal and pelvic lymphadenopathy.   Original Report Authenticated By: Richardean Sale, M.D.    US Renal  11/11/2012  *RADIOLOGY REPORT*  Clinical Data: CKD, ARF.  RENAL/URINARY TRACT ULTRASOUND COMPLETE  Comparison:  08/21/2012  Findings: Technically challenging examination due to patient body habitus, ascites, and limited positioning.   Right Kidney:  Poorly visualized.  There may be mild pelvicaliectasis.  Left Kidney:  Not visualized  Bladder:  Not visualized  Moderate amount of ascites.  IMPRESSION: Suboptimal examination. There may be mild pelvicaliectasis on the right.  Left kidney not visualized.   Original Report Authenticated By: Carlos Levering, M.D.    Dg Chest Port 1 View  11/26/2012  *RADIOLOGY REPORT*  Clinical Data: Cough  PORTABLE CHEST - 1 VIEW  Comparison: 11/19/2012  Findings: Cardiomegaly with pulmonary vascular congestion. Mild interstitial edema is possible, although improved.  No pneumothorax.  Increased opacity of the left hemithorax relative to the right is favored to be secondary to overlying soft tissues.  If this is a true finding, a layering pleural effusion is possible.  IMPRESSION: Cardiomegaly with pulmonary vascular congestion.  Possible mild interstitial edema, improved.   Original Report Authenticated By: Julian Hy, M.D.    Dg Chest Port 1 View  11/19/2012  *RADIOLOGY REPORT*  Clinical Data: Post bronch, intubated  PORTABLE CHEST - 1 VIEW  Comparison: 11/19/2012  Findings: Endotracheal tube 3.1 cm above the carina.  Improvement in the right upper lobe collapse.  Diffuse airspace process persist.  Heart remains enlarged.  Low lung volumes persist.  No enlarging effusion or pneumothorax.  IMPRESSION: Resolving right upper lobe collapse following bronchoscopy. Otherwise stable exam and support apparatus.   Original Report Authenticated By: Jerilynn Mages. Annamaria Boots, M.D.    Dg Chest Port 1 View  11/19/2012  *RADIOLOGY REPORT*  Clinical Data: Evaluate endotracheal tube position.  PORTABLE CHEST - 1 VIEW  Comparison: Chest x-ray 11/18/2012.  Findings: An endotracheal tube is in place with tip 7.8 cm above the carina. There is a left-sided internal jugular central venous catheter with tip terminating in the distal superior vena cava. A nasogastric tube is seen extending into the stomach, however, the tip of the  nasogastric tube extends below the lower margin of the image.  No opacity in the upper right hemithorax with elevation of the minor fissure, compatible with a combination of atelectasis and consolidation in the right upper lobe.  Cephalization of the pulmonary vasculature with indistinctness of interstitial markings, suggesting a background of mild interstitial pulmonary edema.  No definite pleural effusions.  Moderate cardiomegaly is unchanged. The patient is rotated to the left on today's exam, resulting in distortion of the mediastinal contours and reduced diagnostic sensitivity and specificity for mediastinal pathology.  IMPRESSION: 1.  Support apparatus, as above. 2.  Interval development of a combination of atelectasis and consolidation in the right upper lobe. 3.  Moderate cardiomegaly with a background of mild interstitial pulmonary edema, suggesting mild congestive heart failure.   Original Report Authenticated By: Vinnie Langton, M.D.    Dg Chest Port 1 View  11/18/2012  *RADIOLOGY REPORT*  Clinical Data: Left effusion versus pneumonia  PORTABLE CHEST - 1 VIEW  Comparison: 11/17/2012  Findings: Endotracheal tube at the T1-2 level.  NG tube has been pulled back and the tip is now just below the carina in the mid esophagus.  The left jugular central venous catheter tip in the SVC is unchanged.  Improvement in vascular congestion.  There remains bibasilar atelectasis or infiltrate.  No significant effusion.  IMPRESSION: NG has been pulled back with the tip in the mid esophagus.  Improvement in vascular congestion.  Bibasilar atelectasis is unchanged.   Original Report Authenticated By: Carl Best, M.D.    Dg Chest Port 1 View  11/17/2012  *RADIOLOGY REPORT*  Clinical Data: Intubated  PORTABLE CHEST - 1 VIEW  Comparison: 11/16/2012  Findings: The study is limited by patient rotation.  Cardiomegaly again noted.  Stable endotracheal tube position.  Stable left IJ central line position. There is  asymmetric haziness in the left lung.  Although this may be  positional asymmetric edema or pneumonia cannot be excluded. Follow-up examination is recommended.  Stable NG tube position.  IMPRESSION: Cardiomegaly again noted.  Stable endotracheal tube position. Stable left IJ central line position. There is asymmetric haziness in the left lung.  Although this may be  positional asymmetric edema or pneumonia cannot be excluded. Follow-up examination is recommended.  Stable NG tube position.   Original Report Authenticated By: Julien Girt  Pop, M.D.    Dg Chest Port 1 View  11/16/2012  *RADIOLOGY REPORT*  Clinical Data: Evaluate endotracheal tube position  PORTABLE CHEST - 1 VIEW  Comparison: Portable chest x-ray of 11/15/2012  Findings: The tip of the endotracheal is approximately 3.9 cm above the carina.  There is little change in diffuse airspace disease although aeration may have improved slightly.  Cardiomegaly is stable.  Left central venous line tip overlies the mid SVC.  IMPRESSION:  1.  Tip of endotracheal tube 3.9 cm above the carina. 2.  Minimally improved aeration.  Persistent cardiomegaly with little change in diffuse airspace disease.   Original Report Authenticated By: Ivar Drape, M.D.    Dg Chest Port 1 View  11/15/2012  *RADIOLOGY REPORT*  Clinical Data: Assess endotracheal tube position.  PORTABLE CHEST - 1 VIEW  Comparison: Chest x-ray 11/14/2012.  Findings: An endotracheal tube is in place with tip 2.8 cm above the carina. A nasogastric tube is seen extending into the stomach, however, the tip of the nasogastric tube extends below the lower margin of the image.  Worsening opacities in the left lung, particularly in the left upper lobe, concerning for developing multilobar pneumonia.  No definite pleural effusions.  Crowding of the pulmonary vasculature, accentuated by low lung volumes, without frank pulmonary edema.  Mild cardiomegaly is unchanged. The patient is rotated to the right on today's  exam, resulting in distortion of the mediastinal contours and reduced diagnostic sensitivity and specificity for mediastinal pathology.  IMPRESSION: 1.  Support apparatus, as above. 2.  Findings concerning for worsening multilobar pneumonia in the left lung, particularly in the left upper lobe.   Original Report Authenticated By: Vinnie Langton, M.D.    Dg Chest Port 1 View  11/14/2012  *RADIOLOGY REPORT*  Clinical Data: Evaluate endotracheal tube and lungs  PORTABLE CHEST - 1 VIEW  Comparison: 11/13/2012  Findings: Endotracheal tube tip projects 2.5 cm proximal to the carina.  Left IJ catheter tip projects over the left brachiocephalic vein/SVC confluence.  Cardiac enlargement.  Central vascular congestion.  Interstitial prominence.  Elevated right hemidiaphragm.  NG tube descends below the level of the image.  No acute osseous finding.  IMPRESSION: Endotracheal tube tip 2.5 cm above the carina.  Hypoaeration with interstitial and vascular crowding.  Cardiomegaly with mild edema.   Original Report Authenticated By: Carlos Levering, M.D.    Dg Chest Port 1 View  11/13/2012  *RADIOLOGY REPORT*  Clinical Data: Check ET tube position.  PORTABLE CHEST - 1 VIEW  Comparison: 11/12/2012  Findings: Low lung volumes.  Support devices remain in place, unchanged.  Cardiomegaly with vascular congestion and bibasilar atelectasis.  No significant change.  IMPRESSION: No interval change.   Original Report Authenticated By: Rolm Baptise, M.D.    Dg Chest Port 1 View  11/12/2012  *RADIOLOGY REPORT*  Clinical Data: Evaluate endotracheal tube position  PORTABLE CHEST - 1 VIEW  Comparison: Portable chest x-ray of 11/11/2012  Findings: The tip of the endotracheal tube is difficult to visualize, approximately 2.2 cm above the carina.  The lungs are not well aerated and cardiomegaly and mild basilar atelectasis remain.  IMPRESSION: Tip of endotracheal tube 2.2 cm above carina.  No change in poor aeration and basilar  atelectasis.   Original Report Authenticated By: Ivar Drape, M.D.    Dg Chest Port 1 View  11/11/2012  *RADIOLOGY REPORT*  Clinical Data: Attempted right internal jugular line placement. Question pneumothorax.  PORTABLE CHEST - 1 VIEW  Comparison: One-view  chest 11/10/2012.  Findings: The endotracheal tube terminates 4.5 cm above the carina, in satisfactory position.  The left IJ line is stable.  The NG tube courses off the inferior border of the film. The heart remains enlarged.  Edematous changes in the lungs are slightly improved. Lung volumes are slightly improved as well.  There is no pneumothorax.  IMPRESSION:  1.  Slight improved lung volumes and aeration with persistent cardiomegaly and interstitial edema suggesting some degree of congestive heart failure. 2.  Satisfactory positioning of support apparatus as above.   Original Report Authenticated By: San Morelle, M.D.    Dg Chest Portable 1 View  11/10/2012  *RADIOLOGY REPORT*  Clinical Data: Left central line placement.  Altered mental status. Cough and chest congestion.  PORTABLE CHEST - 1 VIEW 8:26 p.m.  Comparison: Chest x-ray dated 11/10/2012 at 7:52 p.m.  Findings: Endotracheal tube in good position.  Central venous catheter tip appears to be in the superior vena cava at the level of the azygos vein.  Increasing pulmonary vascular congestion and pulmonary edema or pneumonia with consolidation in the left perihilar region.  IMPRESSION: Central line appears in good position.  No pneumothorax. Progressive bilateral infiltrates.   Original Report Authenticated By: Lorriane Shire, M.D.    Dg Chest Port 1 View  11/10/2012  *RADIOLOGY REPORT*  Clinical Data: Post intubation  PORTABLE CHEST - 1 VIEW  Comparison: 11/10/2012 at 1822 hours  Findings: Endotracheal tube terminates 3 cm above the carina.  Cardiomegaly with suspected mild interstitial edema.  Mild patchy retrocardiac/left lower lobe opacity, atelectasis versus pneumonia.  No  pleural effusion or pneumothorax.  Enteric tube courses below the diaphragm.  IMPRESSION: Endotracheal tube terminates 3 cm above the carina.  Cardiomegaly with suspected mild interstitial edema.  Mild left lower lobe opacity, atelectasis versus pneumonia.   Original Report Authenticated By: Julian Hy, M.D.    Dg Chest Port 1 View  11/10/2012  *RADIOLOGY REPORT*  Clinical Data: Altered mental status, cough, congestion  PORTABLE CHEST - 1 VIEW  Comparison: 08/16/2012  Findings: Cardiomegaly with pulmonary vascular congestion.  No frank interstitial edema.  Patchy left lower lobe opacity, atelectasis versus pneumonia.  No pleural effusion or pneumothorax.  IMPRESSION: Patchy left lower lobe opacity, atelectasis versus pneumonia.  Cardiomegaly with pulmonary vascular congestion.  No frank interstitial edema.   Original Report Authenticated By: Julian Hy, M.D.    Dg C-arm 1-60 Min-no Report  11/16/2012  CLINICAL DATA: bilateral retrograde pyelogram   C-ARM 1-60 MINUTES  Fluoroscopy was utilized by the requesting physician.  No radiographic  interpretation.      Microbiology: No results found for this or any previous visit (from the past 240 hour(s)).   Labs: Basic Metabolic Panel:  Lab [id number] 11/29/12 0655 11/28/12 0502 11/27/12 0500  NA 143 144 141 141  K 3.7 3.8 3.8 4.0  CL 106 107 104 105  CO2 27 25 26 25   GLUCOSE 89 91 86 91  BUN 42* 53* 55* 59*  CREATININE 2.81* 3.25* 3.49* 3.59*  CALCIUM 8.3* 7.9* 8.1* 7.6*  MG -- -- -- --  PHOS 4.5 4.3 4.4 4.2   Liver Function Tests:  Lab 12/02/12 0713 11/29/12 0655 11/28/12 0502 11/27/12 0500  AST -- -- -- --  ALT -- -- -- --  ALKPHOS -- -- -- --  BILITOT -- -- -- --  PROT -- -- -- --  ALBUMIN 2.3* 2.0* 2.1* 1.9*   No results found for this basename: LIPASE:5,AMYLASE:5 in the last  168 hours No results found for this basename: AMMONIA:5 in the last 168 hours CBC:  Lab 12/02/12 0713 11/29/12 0655 11/28/12 0502  11/27/12 0500  WBC 9.4 9.7 9.4 8.8  NEUTROABS -- -- -- --  HGB 8.4* 8.0* 8.6* 7.8*  HCT 28.5* 26.8* 28.8* 25.6*  MCV 94.7 94.0 95.0 93.1  PLT 292 290 311 271   Cardiac Enzymes: No results found for this basename: CKTOTAL:5,CKMB:5,CKMBINDEX:5,TROPONINI:5 in the last 168 hours BNP: BNP (last 3 results)  Basename 11/10/12 1724 08/16/12 2049 05/22/12 0615  PROBNP 33908.0* 26806.0* 15652.0*   CBG:  Lab 12/03/12 0758 12/02/12 1838 12/02/12 1118 12/02/12 0804 12/01/12 1652  GLUCAP 94 135* 95 81 124*    Time coordinating discharge: 45 minutes  Signed:  Janece Canterbury  [facility] 12/03/2012, 12:52 PM

## 2012-11-29 LAB — CBC
HCT: 26.8 % — ABNORMAL LOW (ref 36.0–46.0)
MCHC: 29.9 g/dL — ABNORMAL LOW (ref 30.0–36.0)
Platelets: 290 10*3/uL (ref 150–400)
RDW: 18 % — ABNORMAL HIGH (ref 11.5–15.5)
WBC: 9.7 10*3/uL (ref 4.0–10.5)

## 2012-11-29 LAB — RENAL FUNCTION PANEL
Albumin: 2 g/dL — ABNORMAL LOW (ref 3.5–5.2)
Calcium: 7.9 mg/dL — ABNORMAL LOW (ref 8.4–10.5)
GFR calc Af Amer: 18 mL/min — ABNORMAL LOW (ref >90)
GFR calc non Af Amer: 16 mL/min — ABNORMAL LOW (ref >90)
Phosphorus: 4.3 mg/dL (ref 2.3–4.6)
Potassium: 3.8 mEq/L (ref 3.5–5.1)
Sodium: 144 mEq/L (ref 135–145)

## 2012-11-29 LAB — GLUCOSE, CAPILLARY
Glucose-Capillary: 116 mg/dL — ABNORMAL HIGH (ref 70–99)
Glucose-Capillary: 136 mg/dL — ABNORMAL HIGH (ref 70–99)
Glucose-Capillary: 143 mg/dL — ABNORMAL HIGH (ref 70–99)

## 2012-11-29 MED ORDER — CALCITRIOL 0.25 MCG PO CAPS
0.2500 ug | ORAL_CAPSULE | Freq: Every day | ORAL | Status: DC
  Administered 2012-11-29 – 2012-12-03 (×5): 0.25 ug via ORAL
  Filled 2012-11-29 (×5): qty 1

## 2012-11-29 MED ORDER — TRAMADOL HCL 50 MG PO TABS
50.0000 mg | ORAL_TABLET | Freq: Two times a day (BID) | ORAL | Status: DC
  Administered 2012-11-30 – 2012-12-03 (×7): 50 mg via ORAL
  Filled 2012-11-29 (×9): qty 1

## 2012-11-29 MED ORDER — TRAMADOL HCL 50 MG PO TABS
50.0000 mg | ORAL_TABLET | Freq: Three times a day (TID) | ORAL | Status: DC
  Administered 2012-11-29 (×2): 50 mg via ORAL
  Filled 2012-11-29 (×3): qty 1

## 2012-11-29 NOTE — Progress Notes (Signed)
   CARE MANAGEMENT NOTE 11/29/2012  Patient:  [facility]   Account Number:  [id number]  Date Initiated:  11/11/2012  Documentation initiated by:  [facility]  Subjective/Objective Assessment:   AMS - LLL pneumonia - sepsis - renal failure, now vent, pressors and CVVH.     Action/Plan:   Anticipated DC Date:  11/18/2012   Anticipated DC Plan:  [place]  CM consult      Choice offered to / List presented to:             Status of service:  In process, will continue to follow Medicare Important Message given?   (If response is "NO", the following Medicare IM given date fields will be blank) Date Medicare IM given:   Date Additional Medicare IM given:    Discharge Disposition:    Per UR Regulation:  Reviewed for med. necessity/level of care/duration of stay  If discussed at [place] of Stay Meetings, dates discussed:   11/19/2012    Comments:  ContactTereva, [place] Spouse [phone]                 Bryant,Marjorie Mother [phone]   11/29/2012  [place], [place]  [phone] CIR: spoke with Leda Gauze regarding status of referral. The patient is refusing to participate in therapies and cannot consider for CIR if she is not participating in therapies.   11-19-12 2:20pm Luz Lex, rNBSN [phone] Continues on vent - not needing dialysis at this time. Agitated.  Bronch today. Will need PT/OT when able.  ?? [facility] or CIR.

## 2012-11-29 NOTE — Progress Notes (Signed)
Received call from Dr Sheran Fava requesting a re-consult on pt re: potential CIR admission. Noted that pt has shown progress with therapy and increased motivation. She ambulated 42' with 2+ assist today. Met with pt's mother today. Pt has continued to refuse SNF and family was planning to take her home and provide care as best they could. At this time pt would require 2+ assist for mobility and ADLs and her family would not be able to provide care safely for her. Pt would benefit from CIR to decrease burden of care on caregivers. In light of pt's increased motivation in therapies, I am beginning the process to request CIR from pt's insurance co. Pt and family are aware that CIR admission is pending BCBS approval. Am faxing cllinicals to [place] today and will follow-up Monday. For questions:  [phone].

## 2012-11-29 NOTE — Progress Notes (Signed)
NUTRITION FOLLOW UP  Intervention:   Continue current regimen RD to continue to follow nutrition care plan  Nutrition Dx:   Inadequate oral intake related to acute illness AEB variable PO intake. Improved.  New Goal: Pt to meet >/= 90% of their estimated nutrition needs.  Monitor:   PO intake, weight trend, labs, I/O  Assessment:   Overall meal intake has improved on average. Palliative care have signed off. Continues with order for [place] po TID.  Height: Ht Readings from Last 1 Encounters:  11/26/12 5\' 4"  (1.626 m)    Weight Status:  Wt fluctuates greatly with fluid status; admit wt of 366 lb Wt Readings from Last 1 Encounters:  11/27/12 359 lb 5.6 oz (163 kg)    Re-estimated needs:  Kcal: 2200 - 2400 kcal Protein: 110 - 120 gm Fluid: 2.2-2.4 L  Skin: incision on perineum  Diet Order: Dysphagia 3 with thins    Intake/Output Summary (Last 24 hours) at 11/29/12 1336 Last data filed at 11/29/12 1300  Gross per 24 hour  Intake    600 ml  Output      0 ml  Net    600 ml    Last BM: 1/2   Labs:   Lab 11/29/12 0655 11/28/12 0502 11/27/12 0500  NA 144 141 141  K 3.8 3.8 4.0  CL 107 104 105  CO2 25 26 25   BUN 53* 55* 59*  CREATININE 3.25* 3.49* 3.59*  CALCIUM 7.9* 8.1* 7.6*  MG -- -- --  PHOS 4.3 4.4 4.2  GLUCOSE 91 86 91    CBG (last 3)   Basename 11/29/12 1214 11/29/12 0843 11/28/12 1628  GLUCAP 136* 116* 114*    Scheduled Meds:    . calcitRIOL  0.25 mcg Oral Daily  . calcium carbonate  1,000 mg of elemental calcium Oral QHS  . carvedilol  6.25 mg Oral BID WC  . darbepoetin (ARANESP) injection - NON-DIALYSIS  200 mcg Subcutaneous Q Thu-1800  . dextrose  1 ampule Intravenous Once  . DULoxetine  60 mg Oral Daily  . feeding supplement  1 Container Oral BID BM  . furosemide  40 mg Oral BID  . insulin aspart  0-20 Units Subcutaneous TID WC  . levothyroxine  25 mcg Oral QAC breakfast  . saccharomyces boulardii  250 mg Oral BID  .  terbinafine   Topical Daily  . traMADol  50 mg Oral TID    Continuous Infusions:  none  Inda Coke MS, RD, LDN Pager: [phone] After-hours pager: [phone]

## 2012-11-29 NOTE — Progress Notes (Signed)
Shelley NOTE  [name] [id number] DOB: 25-Jan-1961 DOA: 11/10/2012 PCP: [name], [name]  Brief narrative: 52 years old morbid obese female with complex PMH relevant for systolic CHF with LVEF of 35 to 40%, HTN, DM2, hypothyroidism, chronic pain on narcotics and CKD being evaluated by nephrology for dialysis. She also has a complex history of suppurative hidradenitis and has been on chronic antibiotic therapy per ID. She was thought to [facility] 12/15 because she was hypoxic in respiratory distress and had altered mental status and was lethargic. She required intubation mechanical ventilation due to bradycardic arrest-please see events below  Past medical history-As per Problem list Chart reviewed as below-  Chronic hidradenitis + leukocytosis-seen at [facility], seen by infectious disease 01/19/12 admission   admitted 04/04/12 for symptomatic anemia hemoglobin 6.7   Presumed nonischemic cardiomyopathy last EF 35%-40% 04/06/12   Chronic kidney disease stage III, followed by nephrology baseline creatinine 2.4   Type 2 diabetes mellitus, last A1c 8.5   Acute on chronic systolic congestive heart failure-admitted 01/19/12, Lasix 60 twice a day given.   Admit 08/16/11-status post 4 units packed red blood cells   History of hypothyroidism, ? MNG   Morbid obesity   Hyperlipidemia   Gout   Evaluated 02/08/11 by plastic surgery at [facility]:  12/15 vanc>>>12/15  12/15 zosyn>>>12/17  12/16 levofloxacin>>>12/17  12/16 ceftriaxone>>>12/29 (planned)   Cultures:  12/15 BC x 2>>>E coli sens ceftr  12/15 urine>>>E coli (variable res)  12/15 tracheal >>>NF   Lines:  12/15 left IJ >>>  12/15 Rt rad>>>12/19  12/15 ETT>>>12/25  12/16 rt fem HD >>>12/25   Events/ [place]:  12/15 >>> Arrest  12/15 renal US>>Suboptimal examination. There may be mild pelvicaliectasis on the<BR>right.<BR> <BR>Left kidney not visualized  12/16>>>ARF, shock, started on cvvhd  12/16  echo - 55% EF, mod reduced rv fxn, PA 46  12/17- pressors better, K resolved  12/17 CT chest- bibasilr small infiltrates / atx  12/17 CT abdo/pelvis>>>Left ureteral stent placement following removal of left  ureteral calculi. No residual left-sided hydronephrosis or ureteral stone fragments. Apparent lithotripsy of the right renal calculi with obstructing 10 mm stone fragment at the right ureteral pelvic junction. There are small renal calyceal stone fragments bilaterally. Anasarca  12/18- off pressors, weaning, awake  12/18- urology called, remains off pressors, failed weaning  12/20- neg balance  12/21 >tranx WL for uro proced>Cystoscopy, left ureter stent removal, bilateral retrograde pyelogram, right ureter stent placement  12/24 bscopy for RUL atx     Subjective   Denies fever, chills, chest pain, diarrhea, nausea, constipation.  Has been using the bed pan.  Worked well with PT.    Objective    Objective: Filed Vitals:   11/29/12 0533 11/29/12 0910 11/29/12 1328 11/29/12 1729  BP: 142/82 143/79 148/72 136/77  Pulse: 82 84 78 76  Temp: 98.2 F (36.8 C) 97.6 F (36.4 C) 98.4 F (36.9 C) 98.5 F (36.9 C)  TempSrc: Oral Oral Oral Oral  Resp: 18 19 19 18   Height:      Weight:      SpO2: 97% 96% 95% 96%    Intake/Output Summary (Last 24 hours) at 11/29/12 2006 Last data filed at 11/29/12 1300  Gross per 24 hour  Intake    600 ml  Output      0 ml  Net    600 ml    Exam:  General: Obese African American female in no apparent distress, hearing impaired.  Cardiovascular: S1-S2 no murmur rub or gallop Respiratory:  CTAB Abdomen: Soft nontender nondistended MSK:  No pain with palpation of the left quadricepts or knee or calf today.    Data Reviewed: Basic Metabolic Panel:  Lab [id number] 0655 11/28/12 0502 11/27/12 0500 11/26/12 0645 11/25/12 1621  NA 144 141 141 139 141  K 3.8 3.8 -- -- --  CL 107 104 105 103 104  CO2 25 26 25 25 25   GLUCOSE 91 86 91 100* 116*    BUN 53* 55* 59* 59* 61*  CREATININE 3.25* 3.49* 3.59* 3.74* 3.79*  CALCIUM 7.9* 8.1* 7.6* 7.8* 7.6*  MG -- -- -- -- --  PHOS 4.3 4.4 4.2 4.9* 4.4   Liver Function Tests:  Lab 11/29/12 0655 11/28/12 0502 11/27/12 0500 11/26/12 0645 11/25/12 1621  AST -- -- -- -- --  ALT -- -- -- -- --  ALKPHOS -- -- -- -- --  BILITOT -- -- -- -- --  PROT -- -- -- -- --  ALBUMIN 2.0* 2.1* 1.9* 2.1* 2.1*   No results found for this basename: LIPASE:5,AMYLASE:5 in the last 168 hours No results found for this basename: AMMONIA:5 in the last 168 hours CBC:  Lab 11/29/12 0655 11/28/12 0502 11/27/12 0500 11/26/12 0645 11/25/12 0922 11/25/12 0635 11/24/12 1234  WBC 9.7 9.4 8.8 10.5 11.1* -- --  NEUTROABS -- -- -- -- 9.1* 8.0* 10.0*  HGB 8.0* 8.6* 7.8* 7.9* 7.9* -- --  HCT 26.8* 28.8* 25.6* 25.6* 25.8* -- --  MCV 94.0 95.0 93.1 94.1 93.1 -- --  PLT 290 311 271 260 261 -- --   Cardiac Enzymes: No results found for this basename: CKTOTAL:5,CKMB:5,CKMBINDEX:5,TROPONINI:5 in the last 168 hours BNP: No components found with this basename: POCBNP:5 CBG:  Lab 11/29/12 1702 11/29/12 1214 11/29/12 0843 11/28/12 1628 11/28/12 1142  GLUCAP 143* 136* 116* 114* 136*    No results found for this or any previous visit (from the past 240 hour(s)).   Studies:              All Imaging reviewed and is as per above notation   Scheduled Meds:    . calcitRIOL  0.25 mcg Oral Daily  . calcium carbonate  1,000 mg of elemental calcium Oral QHS  . carvedilol  6.25 mg Oral BID WC  . darbepoetin (ARANESP) injection - NON-DIALYSIS  200 mcg Subcutaneous Q Thu-1800  . dextrose  1 ampule Intravenous Once  . DULoxetine  60 mg Oral Daily  . feeding supplement  1 Container Oral BID BM  . furosemide  40 mg Oral BID  . insulin aspart  0-20 Units Subcutaneous TID WC  . levothyroxine  25 mcg Oral QAC breakfast  . saccharomyces boulardii  250 mg Oral BID  . terbinafine   Topical Daily  . traMADol  50 mg Oral TID    Continuous Infusions:    Assessment/Plan: 1. Acute renal failure-secondary to shock, creatinine continues to trend down.  Made 877mL urine yesterday -  Appreciate Nephrology assistance 2. Anemia-multifactorial-AoCD/renal parenchymal disease. Hgb baseline 7-9mg /dl. Transfused on the 19th and again on the 26th.   -  Darbepoetin 200 mcg q week-she may need transfusion dependent and hemoglobin -  Tx for hgb < 7 3. Diastolic HF, EF 123456, PA Peak 45%-hold diuretics at present. Has some left lower extremity edema.   -  Continue Coreg 6.25 bid.   -  Continue lasix to 120 mg BID (patient normally on 240mg  BID >> may be contributing  to hearing loss) 4. DM ty 2- FS 89-143.  Continue dysphagia 3 diet, Nepro Carb 5. Depression-continue Cymbalta 60 mg daily 6. Chronic pain continue Percocet 5/325 every 4 when necessary, Flexeril 10 mg 3 times a day when necessary.  Added scheduled tramadol BID. 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 8. Hypothyroidism, stable-continue levothyroxine 9. LLE pain, resolved:  Duplex LLE unable to visualize all veins, but veins that were visualized were compressible and pain resolved today.  If worsens, consider repeating duplex.    DIET:  Dysphagia 3 with thin IVF:  Off PROPH:  SCDs   Code Status: Full Family Communication: Spoke only with patient. Disposition Plan: to CIR on Monday, if medically stable.     Janece Canterbury, [name]  [facility] Pager [phone] 11/29/2012, 8:06 PM    LOS: 19 days

## 2012-11-29 NOTE — Progress Notes (Signed)
Right ureteral stent for rt stone Ok with stent Will continue to follow as in and out-patient

## 2012-11-29 NOTE — Progress Notes (Addendum)
[facility]  Subjective:  Alert and awake but still very hard hearing. Yesterday was able to sit in the chair for 1h. Can ambulate to chair about 6 feet. Not cooperating much with PT.  Objective: Vital signs in last 24 hours: Blood pressure 143/79, pulse 84, temperature 97.6 F (36.4 C), temperature source Oral, resp. rate 19, height 5\' 4"  (1.626 m), weight 359 lb 5.6 oz (163 kg), SpO2 96.00%.   PHYSICAL EXAM  General--as above  Chest--clear  Heart--no rub  Abd--corpulent, nontender. Some indurated areas on ant abd wal  Extr--trace edema   Lab Results:   Lab 11/29/12 0655 11/28/12 0502 11/27/12 0500  NA 144 141 141  K 3.8 3.8 4.0  CL 107 104 105  CO2 25 26 25   BUN 53* 55* 59*  CREATININE 3.25* 3.49* 3.59*  ALB -- -- --  GLUCOSE 91 -- --  CALCIUM 7.9* 8.1* 7.6*  PHOS 4.3 4.4 4.2     Basename 11/29/12 0655 11/28/12 0502  WBC 9.7 9.4  HGB 8.0* 8.6*  HCT 26.8* 28.8*  PLT 290 311   Scheduled Meds:   . calcium carbonate  1,000 mg of elemental calcium Oral QHS  . carvedilol  6.25 mg Oral BID WC  . darbepoetin (ARANESP) injection - NON-DIALYSIS  200 mcg Subcutaneous Q Thu-1800  . dextrose  1 ampule Intravenous Once  . DULoxetine  60 mg Oral Daily  . feeding supplement  1 Container Oral BID BM  . furosemide  40 mg Oral BID  . insulin aspart  0-20 Units Subcutaneous TID WC  . levothyroxine  25 mcg Oral QAC breakfast  . saccharomyces boulardii  250 mg Oral BID  . terbinafine   Topical Daily  . traMADol  50 mg Oral TID   Continuous Infusions:  PRN Meds:.sodium chloride, acetaminophen, albuterol, cyclobenzaprine, ondansetron, oxyCODONE-acetaminophen, RESOURCE THICKENUP CLEAR   Assessment/Plan: 1. AKI supperimposed on CKD (baseline Cr 2.76 Oct 2013). Cr was 8.98 on admission. Cr starting to fall now a little each day. Today 2. Obstructed R ureter--s/p removal of L ureteral stent, removal of R Renal stone, and placement of R ureteral stent by Dr.  Jasmine December on 16 Nov 2012. They will f/u as outpatient. 3. Anemia--received feraheme 510 x 2 last week. On aranesp 200/wk--hgb 8.0  today  4. Pneumonia--better on Ceftriaxone  5. E coli bacteremia-  6. DM-2--per primary service  7. 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 On sensipar, po CaCO3.  On po calcitriol. Ca 7.9 today with alb 2.0 (corrected Ca 9.5)  8. Hearing loss-- ENT saw and made recommendations--not much better today. 9. Immobility--need PT to continue mobilization, pt refuses rehab,         LOS: 19 days   PILOTO, DAYARMYS 11/29/2012,10:06 AM I have seen and examined this patient and agree with plan.  Still very immobile.  Foley cath was d/c'd yesterday.  Will check urine pro/Cr ratio in AM.  Cr decreasing slowly.  Will see again on Monday.  She sees Dr. Florene Glen in our office. Will begin calcitriol for elevated PTH. Kabeer Hoagland F,MD 11/29/2012 11:32 AM

## 2012-11-29 NOTE — Progress Notes (Signed)
OT Cancellation Note  Patient Details Name: [name] MRN: [id number] DOB: 03-19-1961   Cancelled Treatment:    Reason Eval/Treat Not Completed: Pain limiting ability to participate;Fatigue/lethargy limiting ability to participate;Other (comment) (pt refusal). Educated pt on importance of performing OOB mobility and increasing activity tolerance, but pt refusing.    11/29/2012 Darrol Jump OTR/L Pager [phone] Office [phone]

## 2012-11-29 NOTE — Progress Notes (Signed)
Physical Therapy Treatment Patient Details Name: [name] MRN: [id number] DOB: 1961/03/10 Today's Date: 11/29/2012 Time: [id number] PT Time Calculation (min): 23 min  PT Assessment / Plan / Recommendation Comments on Treatment Session  Pt. very pleased with her progress today and family was present and cheering pt. on.  Believe she has potential to make further mobility improvements with ongoing PT.  Will continue efforts.  Have requested that nursing staff assure that pt. is OOB several times per day for short periods of time, especially over the weekend.     Follow Up Recommendations  CIR     Does the patient have the potential to tolerate intense rehabilitation     Barriers to Discharge        Equipment Recommendations  Rolling walker with 5" wheels;Wheelchair (measurements PT);Wheelchair cushion (measurements PT)    Recommendations for Other Services Rehab consult  Frequency Min 3X/week   Plan Discharge plan remains appropriate;Frequency remains appropriate    Precautions / Restrictions Precautions Precautions: Fall Precaution Comments: morbid obesity Restrictions Weight Bearing Restrictions: No   Pertinent Vitals/Pain No pain in left LE but pt. Does report a feeling of numbness.  She is able to detect light touch but has a feeling of "pins and needles" in the leg.    Mobility  Bed Mobility Bed Mobility: Sit to Supine Sit to Supine: 1: +2 Total assist Sit to Supine: Patient Percentage: 20% Details for Bed Mobility Assistance: Needed 2 assist to control sit>supine (trunk and LEs) and for aligning self in bed Transfers Transfers: Sit to Stand;Stand to Sit Sit to Stand: 1: +2 Total assist;With upper extremity assist;From chair/3-in-1 Sit to Stand: Patient Percentage: 60% Stand to Sit: 1: +2 Total assist;With upper extremity assist;To bed Stand to Sit: Patient Percentage: 50% Details for Transfer Assistance: No pain in left wrist today so able to use both UEs for  pushing.  Needs to rock forward to build momentum,to stand, requiring physical assist of 2 people. Ambulation/Gait Ambulation/Gait Assistance: 1: +2 Total assist Ambulation/Gait: Patient Percentage: 70% Ambulation Distance (Feet): 18 Feet Assistive device: Rolling walker Ambulation/Gait Assistance Details: Pt. able to maneuver RW today, needed 2 brief standing rest breaks as she walked.  Physical assist needed from one person for stability and safety, second person for safety primarily. Gait Pattern: Step-through pattern;Trunk flexed Gait velocity: slowed    Exercises     PT Diagnosis:    PT Problem List:   PT Treatment Interventions:     PT Goals Acute Rehab PT Goals PT Goal: Sit to Supine/Side - Progress: Progressing toward goal PT Goal: Sit to Stand - Progress: Progressing toward goal PT Goal: Stand to Sit - Progress: Progressing toward goal PT Goal: Ambulate - Progress: Progressing toward goal  Visit Information  Last PT Received On: 11/29/12 Assistance Needed: +2    Subjective Data  Subjective: Pt. reports her left leg feels numb and was hurting earlier.  Says she has had pain medicine and the pain is much better.   Cognition  Overall Cognitive Status: Appears within functional limits for tasks assessed/performed Area of Impairment: Problem solving Arousal/Alertness: Awake/alert Orientation Level: Appears intact for tasks assessed Behavior During Session: [facility] for tasks performed Problem Solving: needs frequent cues for next step in sequence of tasks Cognition - Other Comments: Cannot hear-being followed by ENT and ST    Balance  Balance Balance Assessed: Yes Static Standing Balance Static Standing - Balance Support: Right upper extremity supported;Left upper extremity supported Static Standing - Level of Assistance: 1: +  2 Total assist;Patient percentage (comment);Other (comment) (pt. 70%)  End of Session PT - End of Session Equipment Utilized During Treatment: Gait  belt Activity Tolerance: Patient tolerated treatment well;Patient limited by fatigue Patient left: in bed;with call bell/phone within reach;with family/visitor present Nurse Communication: Mobility status   GP     Ladona Ridgel 11/29/2012, 2:44 PM [place] [phone]

## 2012-11-30 DIAGNOSIS — I10 Essential (primary) hypertension: Secondary | ICD-10-CM

## 2012-11-30 LAB — GLUCOSE, CAPILLARY: Glucose-Capillary: 83 mg/dL (ref 70–99)

## 2012-11-30 MED ORDER — FUROSEMIDE 80 MG PO TABS
120.0000 mg | ORAL_TABLET | Freq: Two times a day (BID) | ORAL | Status: DC
  Administered 2012-11-30 – 2012-12-02 (×5): 120 mg via ORAL
  Filled 2012-11-30 (×8): qty 1

## 2012-11-30 NOTE — Progress Notes (Addendum)
PROGRESS NOTE  [name] [id number] DOB: Feb 02, 1961 DOA: 11/10/2012 PCP: [name], MD  Brief narrative: 52 years old morbid obese female with complex PMH relevant for systolic CHF with LVEF of 35 to 40%, HTN, DM2, hypothyroidism, chronic pain on narcotics and CKD being evaluated by nephrology for dialysis. She also has a complex history of suppurative hidradenitis and has been on chronic antibiotic therapy per ID. She was thought to [facility] 12/15 because she was hypoxic in respiratory distress and had altered mental status and was lethargic. She required intubation mechanical ventilation due to bradycardic arrest-please see events below  Past medical history-As per Problem list Chart reviewed as below-  Chronic hidradenitis + leukocytosis-seen at [facility], seen by infectious disease 01/19/12 admission   admitted 04/04/12 for symptomatic anemia hemoglobin 6.7   Presumed nonischemic cardiomyopathy last EF 35%-40% 04/06/12   Chronic kidney disease stage III, followed by nephrology baseline creatinine 2.4   Type 2 diabetes mellitus, last A1c 8.5   Acute on chronic systolic congestive heart failure-admitted 01/19/12, Lasix 60 twice a day given.   Admit 08/16/11-status post 4 units packed red blood cells   History of hypothyroidism, ? MNG   Morbid obesity   Hyperlipidemia   Gout   Evaluated 02/08/11 by plastic surgery at [facility]:  12/15 vanc>>>12/15  12/15 zosyn>>>12/17  12/16 levofloxacin>>>12/17  12/16 ceftriaxone>>>12/29 (planned)   Cultures:  12/15 BC x 2>>>E coli sens ceftr  12/15 urine>>>E coli (variable res)  12/15 tracheal >>>NF   Lines:  12/15 left IJ >>>  12/15 Rt rad>>>12/19  12/15 ETT>>>12/25  12/16 rt fem HD >>>12/25   Events/ Studeis:  12/15 >>> Arrest  12/15 renal US>>Suboptimal examination. There may be mild pelvicaliectasis on the<BR>right.<BR> <BR>Left kidney not visualized  12/16>>>ARF, shock, started on cvvhd  12/16  echo - 55% EF, mod reduced rv fxn, PA 46  12/17- pressors better, K resolved  12/17 CT chest- bibasilr small infiltrates / atx  12/17 CT abdo/pelvis>>>Left ureteral stent placement following removal of left  ureteral calculi. No residual left-sided hydronephrosis or ureteral stone fragments. Apparent lithotripsy of the right renal calculi with obstructing 10 mm stone fragment at the right ureteral pelvic junction. There are small renal calyceal stone fragments bilaterally. Anasarca  12/18- off pressors, weaning, awake  12/18- urology called, remains off pressors, failed weaning  12/20- neg balance  12/21 >tranx WL for uro proced>Cystoscopy, left ureter stent removal, bilateral retrograde pyelogram, right ureter stent placement  12/24 bscopy for RUL atx     Subjective   Denies fever, chills, chest pain, diarrhea, nausea, constipation.  Has been using the bed pan.  States the numbness has returned in her left lower extremity.      Objective    Objective: Filed Vitals:   11/29/12 1729 11/29/12 2122 11/30/12 0706 11/30/12 0826  BP: 136/77 144/79 152/75 150/81  Pulse: 76 76 68 76  Temp: 98.5 F (36.9 C) 98.4 F (36.9 C) 97.6 F (36.4 C) 98.3 F (36.8 C)  TempSrc: Oral     Resp: 18 18 18 20   Height:      Weight:      SpO2: 96% 99% 95% 91%    Intake/Output Summary (Last 24 hours) at 11/30/12 1041 Last data filed at 11/30/12 0826  Gross per 24 hour  Intake    480 ml  Output    600 ml  Net   -120 ml    Exam:  General: Obese African American female in  no apparent distress, hearing impaired.   Cardiovascular: S1-S2 no murmur rub or gallop Respiratory:  CTAB Abdomen: Soft nontender nondistended MSK:  No pain with palpation of the left quadricepts, but has numbness of the leg below the knee.  Nontender    Data Reviewed: Basic Metabolic Panel:  Lab [id number] 0655 11/28/12 0502 11/27/12 0500 11/26/12 0645 11/25/12 1621  NA 144 141 141 139 141  K 3.8 3.8 -- -- --  CL 107 104  105 103 104  CO2 25 26 25 25 25   GLUCOSE 91 86 91 100* 116*  BUN 53* 55* 59* 59* 61*  CREATININE 3.25* 3.49* 3.59* 3.74* 3.79*  CALCIUM 7.9* 8.1* 7.6* 7.8* 7.6*  MG -- -- -- -- --  PHOS 4.3 4.4 4.2 4.9* 4.4   Liver Function Tests:  Lab 11/29/12 0655 11/28/12 0502 11/27/12 0500 11/26/12 0645 11/25/12 1621  AST -- -- -- -- --  ALT -- -- -- -- --  ALKPHOS -- -- -- -- --  BILITOT -- -- -- -- --  PROT -- -- -- -- --  ALBUMIN 2.0* 2.1* 1.9* 2.1* 2.1*   No results found for this basename: LIPASE:5,AMYLASE:5 in the last 168 hours No results found for this basename: AMMONIA:5 in the last 168 hours CBC:  Lab 11/29/12 0655 11/28/12 0502 11/27/12 0500 11/26/12 0645 11/25/12 0922 11/25/12 0635 11/24/12 1234  WBC 9.7 9.4 8.8 10.5 11.1* -- --  NEUTROABS -- -- -- -- 9.1* 8.0* 10.0*  HGB 8.0* 8.6* 7.8* 7.9* 7.9* -- --  HCT 26.8* 28.8* 25.6* 25.6* 25.8* -- --  MCV 94.0 95.0 93.1 94.1 93.1 -- --  PLT 290 311 271 260 261 -- --   Cardiac Enzymes: No results found for this basename: CKTOTAL:5,CKMB:5,CKMBINDEX:5,TROPONINI:5 in the last 168 hours BNP: No components found with this basename: POCBNP:5 CBG:  Lab 11/30/12 0736 11/30/12 0102 11/29/12 1702 11/29/12 1214 11/29/12 0843  GLUCAP 83 87 143* 136* 116*    No results found for this or any previous visit (from the past 240 hour(s)).   Studies:              All Imaging reviewed and is as per above notation   Scheduled Meds:    . calcitRIOL  0.25 mcg Oral Daily  . calcium carbonate  1,000 mg of elemental calcium Oral QHS  . carvedilol  6.25 mg Oral BID WC  . darbepoetin (ARANESP) injection - NON-DIALYSIS  200 mcg Subcutaneous Q Thu-1800  . dextrose  1 ampule Intravenous Once  . DULoxetine  60 mg Oral Daily  . feeding supplement  1 Container Oral BID BM  . furosemide  120 mg Oral BID  . insulin aspart  0-20 Units Subcutaneous TID WC  . levothyroxine  25 mcg Oral QAC breakfast  . saccharomyces boulardii  250 mg Oral BID  .  terbinafine   Topical Daily  . traMADol  50 mg Oral Q12H   Continuous Infusions:    Assessment/Plan: 1. Acute renal failure-secondary to shock, creatinine trending down.  340mL urine yesterday, however, likely inaccurate due to bedpan. -  Awaiting morning labs -  Urine protein:creatinine per nephrology today -  Appreciate Nephrology assistance 2. Anemia-multifactorial-AoCD/renal parenchymal disease. Hgb baseline 7-9mg /dl. Transfused on the 19th and again on the 26th.   -  Darbepoetin 200 mcg q week-she may need transfusion dependent and hemoglobin -  Tx for hgb < 7 3. Hx of Diastolic HF:  EF 123456 and no diastolic dysfunction on recent ECHO, PA Peak  45%. Persistent lower extremity edema.   -  Continue Coreg 6.25 bid.   -  Increase lasix to 120 mg BID (patient normally on 240mg  BID >> may be contributing to hearing loss) 4. HTN:  Uncontrolled over last day -  Continue coreg -  Increase lasix -  If no improvement with increased lasix, then increase coreg 5. DM ty 2- FS 83-143.  Continue dysphagia 3 diet, Nepro Carb 6. Depression-continue Cymbalta 60 mg daily 7. Chronic pain continue Percocet 5/325 every 4 when necessary, Flexeril 10 mg 3 times a day when necessary.  - Added scheduled tramadol BID. 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 9. Hypothyroidism, stable-continue levothyroxine 10. LLE pain, resolved, now with numbness.  May be related to diabetic neuropathy:  Duplex LLE unable to visualize all veins, but veins that were visualized were compressible.  If pain recurs, consider repeating duplex.    DIET:  Dysphagia 3 with thin IVF:  Off PROPH:  SCDs   Code Status: Full Family Communication: Spoke only with patient. Disposition Plan: to CIR on Monday, if medically stable.     Janece Canterbury, MD  [facility] Pager [phone] 11/30/2012, 10:41 AM    LOS: 20 days

## 2012-11-30 NOTE — Progress Notes (Signed)
Pt has been incontinent of urine today, therefore we have been unable to collect urine sample that was ordered for 0500 today. Will continue to try and collect urine via bedpan with prompted voids.

## 2012-11-30 NOTE — Progress Notes (Signed)
Pt encouraged numerous times to get out of bed, to the chair, but pt refuses. Pt asked to be pre-medicated with pain medicine, but when the pain medicine started to kick in, pt said she did not want to get up to the chair. Pt fell asleep shortly after. Will continue to encourage pt to get out of bed, but pt lacks motivation.

## 2012-12-01 LAB — GLUCOSE, CAPILLARY
Glucose-Capillary: 82 mg/dL (ref 70–99)
Glucose-Capillary: 128 mg/dL — ABNORMAL HIGH (ref 70–99)
Glucose-Capillary: 124 mg/dL — ABNORMAL HIGH (ref 70–99)

## 2012-12-01 MED ORDER — CARVEDILOL 12.5 MG PO TABS
12.5000 mg | ORAL_TABLET | Freq: Two times a day (BID) | ORAL | Status: DC
  Administered 2012-12-01 – 2012-12-02 (×2): 12.5 mg via ORAL
  Filled 2012-12-01 (×4): qty 1

## 2012-12-01 NOTE — Progress Notes (Signed)
Placed patient on Bipap 12/6 wit oxygen set at 3lpm.

## 2012-12-01 NOTE — Progress Notes (Signed)
PROGRESS NOTE  [name] [id number] DOB: 1961-02-02 DOA: 11/10/2012 PCP: [name], MD  Brief narrative: 52 years old morbid obese female with complex PMH relevant for systolic CHF with LVEF of 35 to 40%, HTN, DM2, hypothyroidism, chronic pain on narcotics and CKD being evaluated by nephrology for dialysis. She also has a complex history of suppurative hidradenitis and has been on chronic antibiotic therapy per ID. She was thought to [facility] 12/15 because she was hypoxic in respiratory distress and had altered mental status and was lethargic. She required intubation mechanical ventilation due to bradycardic arrest-please see events below  Past medical history-As per Problem list Chart reviewed as below-  Chronic hidradenitis + leukocytosis-seen at [facility], seen by infectious disease 01/19/12 admission   admitted 04/04/12 for symptomatic anemia hemoglobin 6.7   Presumed nonischemic cardiomyopathy last EF 35%-40% 04/06/12   Chronic kidney disease stage III, followed by nephrology baseline creatinine 2.4   Type 2 diabetes mellitus, last A1c 8.5   Acute on chronic systolic congestive heart failure-admitted 01/19/12, Lasix 60 twice a day given.   Admit 08/16/11-status post 4 units packed red blood cells   History of hypothyroidism, ? MNG   Morbid obesity   Hyperlipidemia   Gout   Evaluated 02/08/11 by plastic surgery at [name]:  12/15 vanc>>>12/15  12/15 zosyn>>>12/17  12/16 levofloxacin>>>12/17  12/16 ceftriaxone>>>12/29 (planned)   Cultures:  12/15 BC x 2>>>E coli sens ceftr  12/15 urine>>>E coli (variable res)  12/15 tracheal >>>NF   Lines:  12/15 left IJ >>>  12/15 Rt rad>>>12/19  12/15 ETT>>>12/25  12/16 rt fem HD >>>12/25   Events/ Studeis:  12/15 >>> Arrest  12/15 renal US>>Suboptimal examination. There may be mild pelvicaliectasis on the<BR>right.<BR> <BR>Left kidney not visualized  12/16>>>ARF, shock, started on cvvhd  12/16  echo - 55% EF, mod reduced rv fxn, PA 46  12/17- pressors better, K resolved  12/17 CT chest- bibasilr small infiltrates / atx  12/17 CT abdo/pelvis>>>Left ureteral stent placement following removal of left  ureteral calculi. No residual left-sided hydronephrosis or ureteral stone fragments. Apparent lithotripsy of the right renal calculi with obstructing 10 mm stone fragment at the right ureteral pelvic junction. There are small renal calyceal stone fragments bilaterally. Anasarca  12/18- off pressors, weaning, awake  12/18- urology called, remains off pressors, failed weaning  12/20- neg balance  12/21 >tranx WL for uro proced>Cystoscopy, left ureter stent removal, bilateral retrograde pyelogram, right ureter stent placement  12/24 bscopy for RUL atx     Subjective   Denies fever, chills, chest pain, diarrhea, nausea, constipation.  Has stopped using the bedpan and has been incontinent of urine.   LLL improved again today.      Objective    Objective: Filed Vitals:   11/30/12 1649 11/30/12 2100 12/01/12 0436 12/01/12 0919  BP: 150/75 159/73 152/73 138/79  Pulse: 76 75 75 84  Temp: 99.1 F (37.3 C) 97.3 F (36.3 C) 97.2 F (36.2 C) 98.2 F (36.8 C)  TempSrc:  Axillary Oral   Resp: 20 22 20 20   Height:      Weight:      SpO2: 94% 94% 91% 93%    Intake/Output Summary (Last 24 hours) at 12/01/12 1054 Last data filed at 12/01/12 0919  Gross per 24 hour  Intake   1080 ml  Output     50 ml  Net   1030 ml    Exam:  General: Obese African American female in  no apparent distress, hearing impaired.   Cardiovascular: S1-S2 no murmur rub or gallop Respiratory:  CTAB Abdomen: Soft nontender nondistended MSK:  1 + bilateral pitting edema of lower extremities  Data Reviewed: Basic Metabolic Panel:  Lab [id number] 0655 11/28/12 0502 11/27/12 0500 11/26/12 0645 11/25/12 1621  NA 144 141 141 139 141  K 3.8 3.8 -- -- --  CL 107 104 105 103 104  CO2 25 26 25 25 25   GLUCOSE 91  86 91 100* 116*  BUN 53* 55* 59* 59* 61*  CREATININE 3.25* 3.49* 3.59* 3.74* 3.79*  CALCIUM 7.9* 8.1* 7.6* 7.8* 7.6*  MG -- -- -- -- --  PHOS 4.3 4.4 4.2 4.9* 4.4   Liver Function Tests:  Lab 11/29/12 0655 11/28/12 0502 11/27/12 0500 11/26/12 0645 11/25/12 1621  AST -- -- -- -- --  ALT -- -- -- -- --  ALKPHOS -- -- -- -- --  BILITOT -- -- -- -- --  PROT -- -- -- -- --  ALBUMIN 2.0* 2.1* 1.9* 2.1* 2.1*   No results found for this basename: LIPASE:5,AMYLASE:5 in the last 168 hours No results found for this basename: AMMONIA:5 in the last 168 hours CBC:  Lab 11/29/12 0655 11/28/12 0502 11/27/12 0500 11/26/12 0645 11/25/12 0922 11/25/12 0635 11/24/12 1234  WBC 9.7 9.4 8.8 10.5 11.1* -- --  NEUTROABS -- -- -- -- 9.1* 8.0* 10.0*  HGB 8.0* 8.6* 7.8* 7.9* 7.9* -- --  HCT 26.8* 28.8* 25.6* 25.6* 25.8* -- --  MCV 94.0 95.0 93.1 94.1 93.1 -- --  PLT 290 311 271 260 261 -- --   Cardiac Enzymes: No results found for this basename: CKTOTAL:5,CKMB:5,CKMBINDEX:5,TROPONINI:5 in the last 168 hours BNP: No components found with this basename: POCBNP:5 CBG:  Lab 12/01/12 0737 11/30/12 1650 11/30/12 1128 11/30/12 0736 11/30/12 0102  GLUCAP 82 122* 119* 83 87    No results found for this or any previous visit (from the past 240 hour(s)).   Studies:              All Imaging reviewed and is as per above notation   Scheduled Meds:    . calcitRIOL  0.25 mcg Oral Daily  . calcium carbonate  1,000 mg of elemental calcium Oral QHS  . carvedilol  6.25 mg Oral BID WC  . darbepoetin (ARANESP) injection - NON-DIALYSIS  200 mcg Subcutaneous Q Thu-1800  . dextrose  1 ampule Intravenous Once  . DULoxetine  60 mg Oral Daily  . feeding supplement  1 Container Oral BID BM  . furosemide  120 mg Oral BID  . insulin aspart  0-20 Units Subcutaneous TID WC  . levothyroxine  25 mcg Oral QAC breakfast  . saccharomyces boulardii  250 mg Oral BID  . terbinafine   Topical Daily  . traMADol  50 mg Oral Q12H    Continuous Infusions:    Assessment/Plan: 1. Acute renal failure-secondary to shock, creatinine trending down.  315mL urine yesterday, however, patient incontinent of urine and likely not accurate as voided 6X yesterday -  Urine protein:creatinine per nephrology pending -  Appreciate Nephrology assistance -  Renal panel in AM 2. Anemia-multifactorial-AoCD/renal parenchymal disease. Hgb baseline 7-9mg /dl. Transfused on the 19th and again on the 26th.   -  Darbepoetin 200 mcg q week-she may need transfusion dependent and hemoglobin -  Tx for hgb < 7 -  Recheck hemoglobin in AM 3. Hx of Diastolic HF:  EF 123456 and no diastolic dysfunction on recent ECHO, PA  Peak 45%. Persistent lower extremity edema.   -  Continue Coreg 6.25 bid.   -  Continue lasix to 120 mg BID (may be contributing to hearing loss) 4. HTN:  Uncontrolled over last day -  Increase coreg -  Increase lasix -  If no improvement with increased lasix, then increase coreg 5. DM ty 2- FS 82-128.  Continue dysphagia 3 diet, Nepro Carb 6. Depression-continue Cymbalta 60 mg daily 7. Chronic pain continue Percocet 5/325 every 4 when necessary, Flexeril 10 mg TID prn and scheduled tramadol.  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 9. Hypothyroidism, stable-continue levothyroxine 10. LLE pain, resolved, now with numbness.  May be related to diabetic neuropathy:  Duplex LLE unable to visualize all veins, but veins that were visualized were compressible.  If pain recurs, consider repeating duplex.   11. Deconditioning.  Encouraged patient to ask for bedpan and work with physical therapy.    DIET:  Dysphagia 3 with thin IVF:  Off PROPH:  SCDs   Code Status: Full Family Communication: Spoke only with patient. Disposition Plan: to CIR on Monday, if medically stable.     Janece Canterbury, MD  [facility] Pager [phone] 12/01/2012, 10:54 AM    LOS: 21 days

## 2012-12-02 LAB — CBC
MCH: 27.9 pg (ref 26.0–34.0)
MCHC: 29.5 g/dL — ABNORMAL LOW (ref 30.0–36.0)
Platelets: 292 10*3/uL (ref 150–400)
RBC: 3.01 MIL/uL — ABNORMAL LOW (ref 3.87–5.11)
RDW: 17.4 % — ABNORMAL HIGH (ref 11.5–15.5)

## 2012-12-02 LAB — RENAL FUNCTION PANEL
Albumin: 2.3 g/dL — ABNORMAL LOW (ref 3.5–5.2)
Chloride: 106 mEq/L (ref 96–112)
Creatinine, Ser: 2.81 mg/dL — ABNORMAL HIGH (ref 0.50–1.10)
GFR calc non Af Amer: 19 mL/min — ABNORMAL LOW (ref >90)

## 2012-12-02 LAB — GLUCOSE, CAPILLARY: Glucose-Capillary: 135 mg/dL — ABNORMAL HIGH (ref 70–99)

## 2012-12-02 MED ORDER — FUROSEMIDE 80 MG PO TABS
240.0000 mg | ORAL_TABLET | Freq: Two times a day (BID) | ORAL | Status: DC
  Administered 2012-12-02 – 2012-12-03 (×2): 240 mg via ORAL
  Filled 2012-12-02 (×4): qty 3

## 2012-12-02 MED ORDER — CARVEDILOL 25 MG PO TABS
25.0000 mg | ORAL_TABLET | Freq: Two times a day (BID) | ORAL | Status: DC
  Administered 2012-12-02 – 2012-12-03 (×2): 25 mg via ORAL
  Filled 2012-12-02 (×4): qty 1

## 2012-12-02 NOTE — Progress Notes (Addendum)
PROGRESS NOTE  [name] [id number] DOB: 1961-06-11 DOA: 11/10/2012 PCP: [name], MD  Brief narrative: 52 years old morbid obese female with complex PMH relevant for systolic CHF with LVEF of 35 to 40%, HTN, DM2, hypothyroidism, chronic pain on narcotics and CKD being evaluated by nephrology for dialysis. She also has a complex history of suppurative hidradenitis and has been on chronic antibiotic therapy per ID. She was thought to [facility] 12/15 because she was hypoxic in respiratory distress and had altered mental status and was lethargic. She required intubation mechanical ventilation due to bradycardic arrest-please see events below  Past medical history-As per Problem list Chart reviewed as below-  Chronic hidradenitis + leukocytosis-seen at [facility], seen by infectious disease 01/19/12 admission   admitted 04/04/12 for symptomatic anemia hemoglobin 6.7   Presumed nonischemic cardiomyopathy last EF 35%-40% 04/06/12   Chronic kidney disease stage III, followed by nephrology baseline creatinine 2.4   Type 2 diabetes mellitus, last A1c 8.5   Acute on chronic systolic congestive heart failure-admitted 01/19/12, Lasix 60 twice a day given.   Admit 08/16/11-status post 4 units packed red blood cells   History of hypothyroidism, ? MNG   Morbid obesity   Hyperlipidemia   Gout   Evaluated 02/08/11 by plastic surgery at [facility]:  12/15 vanc>>>12/15  12/15 zosyn>>>12/17  12/16 levofloxacin>>>12/17  12/16 ceftriaxone>>>12/29 (planned)   Cultures:  12/15 BC x 2>>>E coli sens ceftr  12/15 urine>>>E coli (variable res)  12/15 tracheal >>>NF   Lines:  12/15 left IJ >>>  12/15 Rt rad>>>12/19  12/15 ETT>>>12/25  12/16 rt fem HD >>>12/25   Events/ Studeis:  12/15 >>> Arrest  12/15 renal US>>Suboptimal examination. There may be mild pelvicaliectasis on the<BR>right.<BR> <BR>Left kidney not visualized  12/16>>>ARF, shock, started on cvvhd  12/16  echo - 55% EF, mod reduced rv fxn, PA 46  12/17- pressors better, K resolved  12/17 CT chest- bibasilr small infiltrates / atx  12/17 CT abdo/pelvis>>>Left ureteral stent placement following removal of left  ureteral calculi. No residual left-sided hydronephrosis or ureteral stone fragments. Apparent lithotripsy of the right renal calculi with obstructing 10 mm stone fragment at the right ureteral pelvic junction. There are small renal calyceal stone fragments bilaterally. Anasarca  12/18- off pressors, weaning, awake  12/18- urology called, remains off pressors, failed weaning  12/20- neg balance  12/21 >tranx WL for uro proced>Cystoscopy, left ureter stent removal, bilateral retrograde pyelogram, right ureter stent placement  12/24 bscopy for RUL atx     Subjective   Denies fever, chills, chest pain, diarrhea, nausea, constipation.  Continues to have some numbness in the left lower extremity.  States she is voiding a lot with the increased lasix.      Objective    Objective: Filed Vitals:   12/01/12 1900 12/01/12 2222 12/02/12 0417 12/02/12 0941  BP: 146/77  160/79 145/99  Pulse: 76 84 76 79  Temp: 98.4 F (36.9 C)  98.5 F (36.9 C) 98 F (36.7 C)  TempSrc: Oral  Oral Oral  Resp: 20 14 16 18   Height:      Weight: 157.8 kg (347 lb 14.2 oz)     SpO2: 94%  98% 98%    Intake/Output Summary (Last 24 hours) at 12/02/12 1216 Last data filed at 12/01/12 1845  Gross per 24 hour  Intake    480 ml  Output      0 ml  Net    480 ml  Exam:  General: Obese African American female in no apparent distress, hearing impaired.   Cardiovascular: S1-S2 no murmur rub or gallop Respiratory:  CTAB Abdomen: Soft nontender nondistended MSK:  1 + bilateral pitting edema of lower extremities, stable from prior  Data Reviewed: Basic Metabolic Panel:  Lab [id number] 0713 11/29/12 0655 11/28/12 0502 11/27/12 0500 11/26/12 0645  NA 143 144 141 141 139  K 3.7 3.8 -- -- --  CL 106 107 104 105  103  CO2 27 25 26 25 25   GLUCOSE 89 91 86 91 100*  BUN 42* 53* 55* 59* 59*  CREATININE 2.81* 3.25* 3.49* 3.59* 3.74*  CALCIUM 8.3* 7.9* 8.1* 7.6* 7.8*  MG -- -- -- -- --  PHOS 4.5 4.3 4.4 4.2 4.9*   Liver Function Tests:  Lab 12/02/12 0713 11/29/12 0655 11/28/12 0502 11/27/12 0500 11/26/12 0645  AST -- -- -- -- --  ALT -- -- -- -- --  ALKPHOS -- -- -- -- --  BILITOT -- -- -- -- --  PROT -- -- -- -- --  ALBUMIN 2.3* 2.0* 2.1* 1.9* 2.1*   No results found for this basename: LIPASE:5,AMYLASE:5 in the last 168 hours No results found for this basename: AMMONIA:5 in the last 168 hours CBC:  Lab 12/02/12 0713 11/29/12 0655 11/28/12 0502 11/27/12 0500 11/26/12 0645  WBC 9.4 9.7 9.4 8.8 10.5  NEUTROABS -- -- -- -- --  HGB 8.4* 8.0* 8.6* 7.8* 7.9*  HCT 28.5* 26.8* 28.8* 25.6* 25.6*  MCV 94.7 94.0 95.0 93.1 94.1  PLT 292 290 311 271 260   Cardiac Enzymes: No results found for this basename: CKTOTAL:5,CKMB:5,CKMBINDEX:5,TROPONINI:5 in the last 168 hours BNP: No components found with this basename: POCBNP:5 CBG:  Lab 12/02/12 1118 12/02/12 0804 12/01/12 1652 12/01/12 1124 12/01/12 0737  GLUCAP 95 81 124* 128* 82    No results found for this or any previous visit (from the past 240 hour(s)).   Studies:              All Imaging reviewed and is as per above notation   Scheduled Meds:    . calcitRIOL  0.25 mcg Oral Daily  . calcium carbonate  1,000 mg of elemental calcium Oral QHS  . carvedilol  12.5 mg Oral BID WC  . darbepoetin (ARANESP) injection - NON-DIALYSIS  200 mcg Subcutaneous Q Thu-1800  . dextrose  1 ampule Intravenous Once  . DULoxetine  60 mg Oral Daily  . feeding supplement  1 Container Oral BID BM  . furosemide  120 mg Oral BID  . insulin aspart  0-20 Units Subcutaneous TID WC  . levothyroxine  25 mcg Oral QAC breakfast  . saccharomyces boulardii  250 mg Oral BID  . terbinafine   Topical Daily  . traMADol  50 mg Oral Q12H   Continuous Infusions:     Assessment/Plan: 1. Acute renal failure-secondary to shock, creatinine trending down.  I/O not recorded due to incontinence.   patient voided 10X yesterday -  Urine protein:creatinine 2.4 -  Appreciate Nephrology assistance  2. Anemia-multifactorial-AoCD/renal parenchymal disease. Hgb baseline 7-9mg /dl. Transfused on the 19th and again on the 26th.  Hgb increased today -  Darbepoetin 200 mcg q week-she may need transfusion dependent and hemoglobin -  Tx for hgb < 7  3. Hx of Diastolic HF:  EF [id number] and no diastolic dysfunction on recent ECHO, PA Peak 45%. Persistent lower extremity edema.   -  Continue Coreg 6.25 bid.   -  Increase  lasix to 240 mg BID (home dose)   4. HTN:  Uncontrolled over last day -  Increase coreg  -  Increase lasix -  Consider adding norvasc  5. DM ty 2- FS 81-124.  Continue dysphagia 3 diet, Nepro Carb 6. Depression-continue Cymbalta 60 mg daily 7. Chronic pain continue Percocet 5/325 every 4 when necessary, Flexeril 10 mg TID prn and scheduled tramadol.  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 9. Hypothyroidism, stable-continue levothyroxine 10. LLE pain, resolved, now with numbness.  May be related to diabetic neuropathy:  Duplex LLE unable to visualize all veins, but veins that were visualized were compressible.  If pain recurs, consider repeating duplex.   11. Deconditioning.  Encouraged patient to ask for bedpan and work with physical therapy.    DIET:  Dysphagia 3 with thin IVF:  Off PROPH:  SCDs   Code Status: Full Family Communication: Spoke only with patient. Disposition Plan: awaiting insurance approval for CIR     Janece Canterbury, MD  [facility] Pager [phone] 12/02/2012, 12:16 PM    LOS: 22 days

## 2012-12-02 NOTE — Procedures (Signed)
Bedside Audiometric Evaluation  Name:  [name] DOB:   [birth year] MRN:    [id number]  Reason for Referral: Hearing loss.  Patient Reported: [name] reported that her hearing loss began within the past 3 weeks.  [name] says she feels like her hearing is slightly better today, but stated "my ears feel like they will explode when I swallow".  [name]'s mother was present for testing and reported that [name] could hear on the telephone prior to 3 weeks ago, but cannot use the phone now.  Pain:  None  Audiological Assessment:  Audiometric Results:              Test reliability good  250 Hz 500 Hz 1000 Hz  2000   Hz 3000 Hz 4000 Hz 6000 Hz 8000 Hz  Right ear Air conduction 55dB 65dB 70dB 65dB DNT 75dB 80dB 85dB  Right ear Bone conduction NR at 40dB 65dB 65dB 65dB DNT 70dB DNT ---  Left ear Air conduction 50dB 65dB 65dB 70dB DNT 65dB 75dB 70dB   No masking used for bone conduction testing   Tympanometry: Left ear:   Eardrum mobility within normal limits (slight negative pressure -105 daPa) Right ear: Eardrum mobility within normal limits (slight negative pressure -145 daPa)  Acoustic Reflex Testing Left ear: Acoustic reflex was absent at 95dB using broadband noise. Right ear: Could not test due to an equipment issue; however there was no response on the screening acoustic reflex (1000Hz  at 90dB).  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   Tympanometry shows some slight retraction but would not cause a sensorineural hearing loss.  Patient/Family Education:  The patient agreed to testing per MD order.  The test results and recommendations were explained to the patient and her mother.  Recommendations:  1.  An ENT evaluation as soon as possible. 2.  A complete diagnostic audiology evaluation is also recommended. This will need to be performed as an outpatient  since we do not have this equipment in house.   If this degree of hearing loss continues then a hearing aid evaluation will be needed.  If you have any questions, please call [phone].  Garet Hooton 12/02/2012 3:30 PM

## 2012-12-02 NOTE — Progress Notes (Signed)
Physical Therapy Treatment Patient Details Name: [name] MRN: [id number] DOB: Feb 23, 1961 Today's Date: 12/02/2012 Time: [id number] PT Time Calculation (min): 27 min  PT Assessment / Plan / Recommendation Comments on Treatment Session  Continuing progress with mobility, with significant improvements in amb distance, transfers, and overall activity tolerance; considering progress over past few sessions, pt is a solid Rehab candidate    Follow Up Recommendations  CIR     Does the patient have the potential to tolerate intense rehabilitation     Barriers to Discharge        Equipment Recommendations  Rolling walker with 5" wheels;Wheelchair (measurements PT);Wheelchair cushion (measurements PT)    Recommendations for Other Services Rehab consult  Frequency Min 3X/week   Plan Discharge plan remains appropriate;Frequency remains appropriate    Precautions / Restrictions Precautions Precautions: Fall Precaution Comments: morbid obesity Restrictions Weight Bearing Restrictions: No   Pertinent Vitals/Pain Pt with pain with mobility (no severity given); Was premedicated for pain prior to session O2 sats remained greater than or equal to 90% throughout session conducted on room air    Mobility  Bed Mobility Bed Mobility: Supine to Sit;Sitting - Scoot to Edge of Bed Supine to Sit: 1: +2 Total assist Supine to Sit: Patient Percentage: 50% Sitting - Scoot to Edge of Bed: 3: Mod assist Details for Bed Mobility Assistance: Cues for technique; Requiring physical assist to move LLE towards EOB, and physical assist using bed pad to work to aligning hips at EOB; Noted better use of UEs to push upper body from bed with sidelying to sit; Verbal and tactile cueing to lateral weight shift to unweigh hips R and L (though especially L hip) for reciprocal scooting Transfers Transfers: Sit to Stand;Stand to Sit Sit to Stand: 4: Min assist;From bed;From chair/3-in-1;With upper extremity  assist Stand to Sit: 4: Min assist;With armrests;To chair/3-in-1 Details for Transfer Assistance: Much improving sit to/from stand; Requiring cues for safety and hand position, and to control descent with stand to sit Ambulation/Gait Ambulation/Gait Assistance: 1: +2 Total assist Ambulation/Gait: Patient Percentage: 80% Ambulation Distance (Feet): 30 Feet (bed to [facility] to sink, then into hallway) Assistive device: Rolling walker Ambulation/Gait Assistance Details: Continued verbal and tactile cueing for posture, RW proximity, safety; Pt with quite slow gait, and heavy dependence of UEs on RW for steadiness; Tending to step with bil foot flat; Noted some incr DOE with amb to about 2/4; O2 sats remained 90% or greater Gait Pattern: Step-through pattern;Trunk flexed Gait velocity: slowed    Exercises     PT Diagnosis:    PT Problem List:   PT Treatment Interventions:     PT Goals Acute Rehab PT Goals Time For Goal Achievement: 12/05/12 Potential to Achieve Goals: Fair Pt will go Supine/Side to Sit: with supervision PT Goal: Supine/Side to Sit - Progress: Progressing toward goal Pt will go Sit to Stand: with min assist PT Goal: Sit to Stand - Progress: Partly met Pt will go Stand to Sit: with min assist PT Goal: Stand to Sit - Progress: Partly met Pt will Ambulate: 1 - 15 feet;with rolling walker;with mod assist PT Goal: Ambulate - Progress: Partly met Additional Goals   Will likely progress mobility goals next session   Visit Information  Last PT Received On: 12/02/12 Assistance Needed: +1 (+2 helpful to push chair)    Subjective Data  Subjective: Seems encouraged by recent progress with mobility Patient Stated Goal: to be able to walk again   Cognition  Overall Cognitive Status: Appears  within functional limits for tasks assessed/performed Arousal/Alertness: Awake/alert Orientation Level: Appears intact for tasks assessed Behavior During Session: [facility] for tasks  performed Cognition - Other Comments: Cannot hear-being followed by ENT and ST    Balance     End of Session PT - End of Session Equipment Utilized During Treatment: Gait belt Activity Tolerance: Patient tolerated treatment well Patient left: in chair;with call bell/phone within reach;with family/visitor present;Other (comment) (Audiology about to work with pt) Nurse Communication: Mobility status   GP     Quin Hoop [place], [place]  12/02/2012, 2:54 PM

## 2012-12-02 NOTE — Progress Notes (Signed)
[facility]  Subjective:  Mostly sleeping but arousable. Still with difficulty hearing. Reports has walked with walker yesterday and sat on chair.  Objective: Vital signs in last 24 hours: Blood pressure 145/99, pulse 79, temperature 98 F (36.7 C), temperature source Oral, resp. rate 18, height 5\' 4"  (1.626 m), weight 347 lb 14.2 oz (157.8 kg), SpO2 98.00%.  PHYSICAL EXAM  Gen: Morbidly obese. NAD Neck: no JVD  Chest: Clear breath sounds. No rales Cardio: regular rate and rhythm, S1, S2 normal, no murmur, click, rub or gallop  GI: soft, non-tender; bowel sounds normal Extremities: extremities normal, atraumatic, no cyanosis, mild trace edema  Pulses: 2+ and symmetric  Neurologic: No focal deficit  Lab Results:   Lab 12/02/12 0713 11/29/12 0655 11/28/12 0502  NA 143 144 141  K 3.7 3.8 3.8  CL 106 107 104  CO2 27 25 26   BUN 42* 53* 55*  CREATININE 2.81* 3.25* 3.49*  ALB -- -- --  GLUCOSE 89 -- --  CALCIUM 8.3* 7.9* 8.1*  PHOS 4.5 4.3 4.4     Basename 12/02/12 0713  WBC 9.4  HGB 8.4*  HCT 28.5*  PLT 292   Scheduled Meds:   . calcitRIOL  0.25 mcg Oral Daily  . calcium carbonate  1,000 mg of elemental calcium Oral QHS  . carvedilol  25 mg Oral BID WC  . darbepoetin (ARANESP) injection - NON-DIALYSIS  200 mcg Subcutaneous Q Thu-1800  . dextrose  1 ampule Intravenous Once  . DULoxetine  60 mg Oral Daily  . feeding supplement  1 Container Oral BID BM  . furosemide  120 mg Oral BID  . insulin aspart  0-20 Units Subcutaneous TID WC  . levothyroxine  25 mcg Oral QAC breakfast  . saccharomyces boulardii  250 mg Oral BID  . terbinafine   Topical Daily  . traMADol  50 mg Oral Q12H   Continuous Infusions:  PRN Meds:.sodium chloride, acetaminophen, albuterol, cyclobenzaprine, ondansetron, oxyCODONE-acetaminophen, RESOURCE THICKENUP CLEAR  Assessment/Plan:  1. AKI supperimposed on CKD (baseline Cr 2.76 Oct 2013). Cr was 8.98 on admission. Cr 2.81, close  to pt baseline. Normal K. Will sign off.    2. Obstructed R ureter--s/p removal of L ureteral stent, removal of R Renal stone, and placement of R ureteral stent by Dr. Jasmine December on 16 Nov 2012.  3. Anemia--received feraheme 510 x 2 last week. On aranesp 200/wk--hgb 8.4 today stable. 4. Pneumonia--better on Ceftriaxone  5. DM-2--per primary service  6. 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 On sensipar, po CaCO3, and IV paracalcitol. Will change to po paracalcitol. Ca 8.3 (corrected Ca 9.7)  7. Hearing loss-- ENT saw and made recommendations--not much better today  8. Immobility--very little progress achieving better mobility.   LOS: 22 days   PILOTO, DAYARMYS 12/02/2012,12:20 PM I have seen and examined this patient and agree with the plan of care .  Bacilio Abascal W 12/02/2012, 9:49 PM

## 2012-12-02 NOTE — Progress Notes (Signed)
Chart reviewed in depth. Until today, this patient has displayed a consistent pattern of refusals and limited participation in therapies. I continue believe that SNF is the best environment for her given exercise tolerance issues and dispo. At an SNF she will be able to paricipate at her own pace, and family will have a longer period of time to make long term dispo arrangements.    Oval Linsey, MD, Mellody Drown

## 2012-12-02 NOTE — Progress Notes (Signed)
Occupational Therapy Treatment Patient Details Name: [name] MRN: [id number] DOB: 10-20-1961 Today's Date: 12/02/2012 Time: [id number] OT Time Calculation (min): 27 min  OT Assessment / Plan / Recommendation Comments on Treatment Session Pt continuing to make great progress since previous session.  Motivated to participate in PT/OT treatment.  Able to tolerate standing activity incorporated into transfers and selfcare tasks for over 8 mins before needing rest breaks.  Still needs at least min assist for all transfers and max to total assist for some aspects of toileting an LB selfcare.  Feel she would make excellent progress in CIR setting.    Follow Up Recommendations  CIR       Equipment Recommendations  3 in 1 bedside comode    Recommendations for Other Services Rehab consult  Frequency Min 2X/week   Plan Discharge plan needs to be updated    Precautions / Restrictions Precautions Precautions: Fall Precaution Comments: morbid obesity Restrictions Weight Bearing Restrictions: No   Pertinent Vitals/Pain HR 100-107 during session, O2 sats 91-92% on room air    ADL  Grooming: Performed;Minimal assistance;Wash/dry face;Wash/dry hands;Brushing hair;Other (comment) (standing at the sink with the RW.) Where Assessed - Grooming: Supported standing Lower Body Bathing: Performed;Maximal assistance Where Assessed - Lower Body Bathing: Supported sit to stand Lower Body Dressing: Performed;+1 Total assistance;Other (comment) (For donning gripper socks) Where Assessed - Lower Body Dressing: Unsupported sitting Toilet Transfer: Performed;Minimal assistance Toilet Transfer Method: Other (comment) (ambulating 5-6 feet with the RW.) Toilet Transfer Equipment: Extra wide bedside commode Toileting - Clothing Manipulation and Hygiene: Performed;+1 Total assistance Where Assessed - Toileting Clothing Manipulation and Hygiene: Standing Transfers/Ambulation Related to ADLs: Pt able to ambulate  to the bedside toilet, sink, and just outside of the room with the RW and min assist for balance. ADL Comments: Pt eager to participate in session.  Needed overall mod assist for transition from supine to sit EOB.  Once sitting still demonstrates increased lean to the right side secondary to left buttocks pain.  Mod instructional cueing for hand placement with sit to stand from the bed.  Pt unable to reach her peri area for hygien after toileting.  May benefit from AE to assist with this.  Able to maintain standing and mobility for 7-8 mins to perform grooming tasks and transfers.  In standing she needs mod instructional cueing to stand up straight.  At times secondary to back pain she stands with lumbar hyper-extension..  O2 sats 91-92% during the entire session with HR 100-107 with activity.  Pt needing cueing to take slower deeper breaths during mobility tasks.     OT Goals Acute Rehab OT Goals Time For Goal Achievement: 12/16/12 ADL Goals Pt Will Perform Grooming: with supervision;Supported;Standing at sink;Other (comment) (3 tasks) ADL Goal: Grooming - Progress: Updated due to goal met ADL Goal: Lower Body Bathing - Progress: Progressing toward goals ADL Goal: Upper Body Dressing - Progress: Progressing toward goals ADL Goal: Lower Body Dressing - Progress: Progressing toward goals Pt Will Transfer to Toilet: with supervision;with DME;3-in-1 ADL Goal: Toilet Transfer - Progress: Updated due to goal met ADL Goal: Toileting - Clothing Manipulation - Progress: Progressing toward goals  Visit Information  Last OT Received On: 12/02/12 Assistance Needed: +1    Subjective Data  Subjective: I just started hurting in my leg. Patient Stated Goal: Pt did not state but agreeable to participate in therapy.      Cognition  Overall Cognitive Status: Appears within functional limits for tasks assessed/performed Area of Impairment: Safety/judgement  Arousal/Alertness: Awake/alert Orientation Level:  Appears intact for tasks assessed Behavior During Session: [facility] for tasks performed Current Attention Level: Sustained Memory Deficits: Needs mod instructional cueing for hand placement with sit to stand transitions. Cognition - Other Comments: Cannot hear-being followed by ENT and ST    Mobility  Shoulder Instructions Bed Mobility Bed Mobility: Supine to Sit;Sitting - Scoot to Edge of Bed Supine to Sit: 1: +2 Total assist Supine to Sit: Patient Percentage: 50% Sitting - Scoot to Edge of Bed: 3: Mod assist Details for Bed Mobility Assistance: Cues for technique; Requiring physical assist to move LLE towards EOB, and physical assist using bed pad to work to aligning hips at EOB; Noted better use of UEs to push upper body from bed with sidelying to sit; Verbal and tactile cueing to lateral weight shift to unweigh hips R and L (though especially L hip) for reciprocal scooting Transfers Transfers: Sit to Stand;Stand to Sit Sit to Stand: 4: Min assist;From bed;From chair/3-in-1;With upper extremity assist Stand to Sit: 4: Min assist;With armrests;To chair/3-in-1 Details for Transfer Assistance: Much improving sit to/from stand; Requiring cues for safety and hand position, and to control descent with stand to sit          Balance Static Sitting Balance Static Sitting - Balance Support: Right upper extremity supported;Left upper extremity supported Static Sitting - Level of Assistance: 5: Stand by assistance Static Standing Balance Static Standing - Balance Support: No upper extremity supported Static Standing - Level of Assistance: 4: Min assist;Other (comment)   End of Session OT - End of Session Equipment Utilized During Treatment: Gait belt Activity Tolerance: Patient limited by fatigue Patient left: in chair;with call bell/phone within reach;with family/visitor present Nurse Communication: Mobility status      [place] OTR/L Pager number [phone] 12/02/2012, 3:13 PM

## 2012-12-02 NOTE — Progress Notes (Signed)
CIR Admissions: Await notification from [employer] re: authorization for CIR admission. However, noted that pt refused to get out of bed over the weekend. As long as pt is refusing to get OOB or participate in all therapies she will not considered to be able to participate in the intensity of CIR. Will continue to follow. For questions: [phone]

## 2012-12-02 NOTE — Progress Notes (Signed)
CIR Admissions: [employer] requesting updated therapy notes. Have informed PT & OT that we await progress notes.  For questions: [phone]

## 2012-12-02 NOTE — Progress Notes (Signed)
Informed Dr Naaman Plummer of request to reevaluate pt for CIR. Dr Naaman Plummer reviewed chart and reports that pt is not a candidate for CIR due to inconsistent participation in therapies. Called Dr Sheran Fava to inform her of decision.  For questions call [phone]

## 2012-12-03 MED ORDER — PRAVASTATIN SODIUM 40 MG PO TABS: 40.0000 mg | ORAL_TABLET | Freq: Every day | ORAL | Status: DC

## 2012-12-03 MED ORDER — TERBINAFINE HCL 1 % EX CREA: TOPICAL_CREAM | Freq: Every day | CUTANEOUS | Status: DC

## 2012-12-03 MED ORDER — BOOST / RESOURCE BREEZE PO LIQD: 1.0000 | Freq: Two times a day (BID) | ORAL | Status: DC

## 2012-12-03 MED ORDER — OXYCODONE-ACETAMINOPHEN 5-325 MG PO TABS: 1.0000 | ORAL_TABLET | Freq: Four times a day (QID) | ORAL | Status: DC | PRN

## 2012-12-03 MED ORDER — CARVEDILOL 25 MG PO TABS: 25.0000 mg | ORAL_TABLET | Freq: Two times a day (BID) | ORAL | Status: DC

## 2012-12-03 MED ORDER — CALCIUM CARBONATE 1250 (500 CA) MG PO TABS: 2.0000 | ORAL_TABLET | Freq: Every day | ORAL | Status: DC

## 2012-12-03 MED ORDER — CYCLOBENZAPRINE HCL 10 MG PO TABS: 10.0000 mg | ORAL_TABLET | Freq: Three times a day (TID) | ORAL | Status: DC | PRN

## 2012-12-03 MED ORDER — TRAMADOL HCL 50 MG PO TABS: 50.0000 mg | ORAL_TABLET | Freq: Two times a day (BID) | ORAL | Status: DC

## 2012-12-03 MED ORDER — ISOSORBIDE MONONITRATE ER 30 MG PO TB24: 30.0000 mg | ORAL_TABLET | Freq: Every day | ORAL | Status: DC

## 2012-12-03 MED ORDER — FERROUS SULFATE 325 (65 FE) MG PO TABS: 325.0000 mg | ORAL_TABLET | Freq: Two times a day (BID) | ORAL | Status: DC

## 2012-12-03 MED ORDER — CALCITRIOL 0.25 MCG PO CAPS: 0.2500 ug | ORAL_CAPSULE | Freq: Every day | ORAL | Status: DC

## 2012-12-03 MED ORDER — LEVOTHYROXINE SODIUM 25 MCG PO TABS: 25.0000 ug | ORAL_TABLET | Freq: Every morning | ORAL | Status: DC

## 2012-12-03 MED ORDER — DULOXETINE HCL 60 MG PO CPEP: 60.0000 mg | ORAL_CAPSULE | Freq: Every day | ORAL | Status: AC

## 2012-12-03 MED ORDER — FUROSEMIDE 80 MG PO TABS: 240.0000 mg | ORAL_TABLET | Freq: Two times a day (BID) | ORAL | Status: DC

## 2012-12-03 MED ORDER — FOLIC ACID 1 MG PO TABS: 1.0000 mg | ORAL_TABLET | Freq: Every day | ORAL | Status: DC

## 2012-12-03 NOTE — Progress Notes (Signed)
[facility] aware of discharge for today, [place] PT, OT, aide  [facility] / [facility] plan delivery of equipment today,  [name] [phone] to be called with delivery time.  NCM called [name] to advise of referrals to [facility] for [facility] and DME. She will be called with a delivery time.

## 2012-12-03 NOTE — Care Management Note (Signed)
Message left for [facility] for HHRN, [place], [place] and [place] aide. Spoke with [facility] rep, [facility] re DME needs and contact for delivery. Plan for d/c today as soon as DME is delivered.  Will continue to follow. Jasmine Pang RN MPH Case Manager [phone]

## 2012-12-04 LAB — GLUCOSE, CAPILLARY: Glucose-Capillary: 143 mg/dL — ABNORMAL HIGH (ref 70–99)

## 2012-12-25 ENCOUNTER — Other Ambulatory Visit: Payer: Self-pay | Admitting: Nephrology

## 2012-12-25 DIAGNOSIS — N184 Chronic kidney disease, stage 4 (severe): Secondary | ICD-10-CM

## 2012-12-31 ENCOUNTER — Ambulatory Visit
Admission: RE | Admit: 2012-12-31 | Discharge: 2012-12-31 | Disposition: A | Discharge status: Home / Self Care | Payer: BC Managed Care – PPO | Source: Ambulatory Visit | Attending: Nephrology | Admitting: Nephrology

## 2012-12-31 DIAGNOSIS — N184 Chronic kidney disease, stage 4 (severe): Secondary | ICD-10-CM

## 2013-01-11 ENCOUNTER — Other Ambulatory Visit: Payer: Self-pay

## 2013-01-24 ENCOUNTER — Inpatient Hospital Stay (HOSPITAL_COMMUNITY)
Admission: EM | Admit: 2013-01-24 | Discharge: 2013-01-29 | DRG: 568 | Disposition: A | Discharge status: Home / Self Care | Payer: BC Managed Care – PPO | Attending: Internal Medicine | Admitting: Internal Medicine

## 2013-01-24 ENCOUNTER — Inpatient Hospital Stay (HOSPITAL_COMMUNITY): Payer: BC Managed Care – PPO

## 2013-01-24 ENCOUNTER — Encounter (HOSPITAL_COMMUNITY): Payer: Self-pay | Admitting: Emergency Medicine

## 2013-01-24 DIAGNOSIS — D62 Acute posthemorrhagic anemia: Secondary | ICD-10-CM | POA: Diagnosis not present

## 2013-01-24 DIAGNOSIS — N189 Chronic kidney disease, unspecified: Secondary | ICD-10-CM

## 2013-01-24 DIAGNOSIS — I5020 Unspecified systolic (congestive) heart failure: Secondary | ICD-10-CM

## 2013-01-24 DIAGNOSIS — J9811 Atelectasis: Secondary | ICD-10-CM

## 2013-01-24 DIAGNOSIS — N200 Calculus of kidney: Secondary | ICD-10-CM

## 2013-01-24 DIAGNOSIS — N183 Chronic kidney disease, stage 3 unspecified: Secondary | ICD-10-CM

## 2013-01-24 DIAGNOSIS — Z888 Allergy status to other drugs, medicaments and biological substances status: Secondary | ICD-10-CM

## 2013-01-24 DIAGNOSIS — N1330 Unspecified hydronephrosis: Secondary | ICD-10-CM

## 2013-01-24 DIAGNOSIS — E872 Acidosis, unspecified: Secondary | ICD-10-CM

## 2013-01-24 DIAGNOSIS — A419 Sepsis, unspecified organism: Secondary | ICD-10-CM

## 2013-01-24 DIAGNOSIS — D500 Iron deficiency anemia secondary to blood loss (chronic): Secondary | ICD-10-CM | POA: Diagnosis present

## 2013-01-24 DIAGNOSIS — B965 Pseudomonas (aeruginosa) (mallei) (pseudomallei) as the cause of diseases classified elsewhere: Secondary | ICD-10-CM

## 2013-01-24 DIAGNOSIS — Y92009 Unspecified place in unspecified non-institutional (private) residence as the place of occurrence of the external cause: Secondary | ICD-10-CM

## 2013-01-24 DIAGNOSIS — N179 Acute kidney failure, unspecified: Principal | ICD-10-CM

## 2013-01-24 DIAGNOSIS — F3289 Other specified depressive episodes: Secondary | ICD-10-CM | POA: Diagnosis present

## 2013-01-24 DIAGNOSIS — D72829 Elevated white blood cell count, unspecified: Secondary | ICD-10-CM

## 2013-01-24 DIAGNOSIS — Z8701 Personal history of pneumonia (recurrent): Secondary | ICD-10-CM

## 2013-01-24 DIAGNOSIS — Z87442 Personal history of urinary calculi: Secondary | ICD-10-CM

## 2013-01-24 DIAGNOSIS — Z7982 Long term (current) use of aspirin: Secondary | ICD-10-CM

## 2013-01-24 DIAGNOSIS — A0472 Enterocolitis due to Clostridium difficile, not specified as recurrent: Secondary | ICD-10-CM | POA: Diagnosis not present

## 2013-01-24 DIAGNOSIS — E860 Dehydration: Secondary | ICD-10-CM | POA: Diagnosis present

## 2013-01-24 DIAGNOSIS — Z8249 Family history of ischemic heart disease and other diseases of the circulatory system: Secondary | ICD-10-CM

## 2013-01-24 DIAGNOSIS — L732 Hidradenitis suppurativa: Secondary | ICD-10-CM

## 2013-01-24 DIAGNOSIS — K219 Gastro-esophageal reflux disease without esophagitis: Secondary | ICD-10-CM

## 2013-01-24 DIAGNOSIS — Z6841 Body Mass Index (BMI) 40.0 and over, adult: Secondary | ICD-10-CM

## 2013-01-24 DIAGNOSIS — I5023 Acute on chronic systolic (congestive) heart failure: Secondary | ICD-10-CM

## 2013-01-24 DIAGNOSIS — Z79899 Other long term (current) drug therapy: Secondary | ICD-10-CM

## 2013-01-24 DIAGNOSIS — I509 Heart failure, unspecified: Secondary | ICD-10-CM | POA: Diagnosis present

## 2013-01-24 DIAGNOSIS — E119 Type 2 diabetes mellitus without complications: Secondary | ICD-10-CM

## 2013-01-24 DIAGNOSIS — D649 Anemia, unspecified: Secondary | ICD-10-CM

## 2013-01-24 DIAGNOSIS — N390 Urinary tract infection, site not specified: Secondary | ICD-10-CM

## 2013-01-24 DIAGNOSIS — D509 Iron deficiency anemia, unspecified: Secondary | ICD-10-CM | POA: Diagnosis present

## 2013-01-24 DIAGNOSIS — I129 Hypertensive chronic kidney disease with stage 1 through stage 4 chronic kidney disease, or unspecified chronic kidney disease: Secondary | ICD-10-CM | POA: Diagnosis present

## 2013-01-24 DIAGNOSIS — I10 Essential (primary) hypertension: Secondary | ICD-10-CM

## 2013-01-24 DIAGNOSIS — F0630 Mood disorder due to known physiological condition, unspecified: Secondary | ICD-10-CM

## 2013-01-24 DIAGNOSIS — E875 Hyperkalemia: Secondary | ICD-10-CM

## 2013-01-24 DIAGNOSIS — E042 Nontoxic multinodular goiter: Secondary | ICD-10-CM

## 2013-01-24 DIAGNOSIS — T370X5A Adverse effect of sulfonamides, initial encounter: Secondary | ICD-10-CM | POA: Diagnosis present

## 2013-01-24 DIAGNOSIS — Z87440 Personal history of urinary (tract) infections: Secondary | ICD-10-CM

## 2013-01-24 DIAGNOSIS — E039 Hypothyroidism, unspecified: Secondary | ICD-10-CM | POA: Diagnosis present

## 2013-01-24 DIAGNOSIS — N184 Chronic kidney disease, stage 4 (severe): Secondary | ICD-10-CM | POA: Diagnosis present

## 2013-01-24 DIAGNOSIS — L88 Pyoderma gangrenosum: Secondary | ICD-10-CM

## 2013-01-24 DIAGNOSIS — Z881 Allergy status to other antibiotic agents status: Secondary | ICD-10-CM

## 2013-01-24 DIAGNOSIS — R6510 Systemic inflammatory response syndrome (SIRS) of non-infectious origin without acute organ dysfunction: Secondary | ICD-10-CM

## 2013-01-24 DIAGNOSIS — Z872 Personal history of diseases of the skin and subcutaneous tissue: Secondary | ICD-10-CM

## 2013-01-24 LAB — CBC WITH DIFFERENTIAL/PLATELET
Basophils Absolute: 0 10*3/uL (ref 0.0–0.1)
Eosinophils Relative: 4 % (ref 0–5)
HCT: 27.3 % — ABNORMAL LOW (ref 36.0–46.0)
Hemoglobin: 8.4 g/dL — ABNORMAL LOW (ref 12.0–15.0)
Lymphocytes Relative: 6 % — ABNORMAL LOW (ref 12–46)
Lymphs Abs: 1.1 10*3/uL (ref 0.7–4.0)
MCV: 94.1 fL (ref 78.0–100.0)
Monocytes Absolute: 0.7 10*3/uL (ref 0.1–1.0)
Monocytes Relative: 4 % (ref 3–12)
Neutro Abs: 14.9 10*3/uL — ABNORMAL HIGH (ref 1.7–7.7)
RBC: 2.9 MIL/uL — ABNORMAL LOW (ref 3.87–5.11)
WBC: 17.5 10*3/uL — ABNORMAL HIGH (ref 4.0–10.5)

## 2013-01-24 LAB — BASIC METABOLIC PANEL
BUN: 86 mg/dL — ABNORMAL HIGH (ref 6–23)
CO2: 12 mEq/L — ABNORMAL LOW (ref 19–32)
Chloride: 112 mEq/L (ref 96–112)
Glucose, Bld: 150 mg/dL — ABNORMAL HIGH (ref 70–99)
Potassium: 6.8 mEq/L (ref 3.5–5.1)

## 2013-01-24 LAB — POCT I-STAT, CHEM 8
BUN: 91 mg/dL — ABNORMAL HIGH (ref 6–23)
Calcium, Ion: 1.27 mmol/L — ABNORMAL HIGH (ref 1.12–1.23)
Chloride: 121 mEq/L — ABNORMAL HIGH (ref 96–112)
Creatinine, Ser: 4.8 mg/dL — ABNORMAL HIGH (ref 0.50–1.10)
TCO2: 14 mmol/L (ref 0–100)

## 2013-01-24 MED ORDER — ONDANSETRON HCL 4 MG/2ML IJ SOLN
4.0000 mg | Freq: Four times a day (QID) | INTRAMUSCULAR | Status: DC | PRN
  Administered 2013-01-25: 4 mg via INTRAVENOUS
  Filled 2013-01-24: qty 2

## 2013-01-24 MED ORDER — INSULIN ASPART 100 UNIT/ML ~~LOC~~ SOLN
5.0000 [IU] | Freq: Once | SUBCUTANEOUS | Status: AC
  Administered 2013-01-24: 5 [IU] via SUBCUTANEOUS
  Filled 2013-01-24 (×2): qty 1

## 2013-01-24 MED ORDER — SACCHAROMYCES BOULARDII 250 MG PO CAPS
250.0000 mg | ORAL_CAPSULE | Freq: Two times a day (BID) | ORAL | Status: DC
  Administered 2013-01-25 – 2013-01-29 (×10): 250 mg via ORAL
  Filled 2013-01-24 (×11): qty 1

## 2013-01-24 MED ORDER — SODIUM CHLORIDE 0.9 % IV SOLN: INTRAVENOUS | Status: DC

## 2013-01-24 MED ORDER — ACETAMINOPHEN 325 MG PO TABS
650.0000 mg | ORAL_TABLET | Freq: Four times a day (QID) | ORAL | Status: DC | PRN
  Administered 2013-01-26: 650 mg via ORAL
  Filled 2013-01-24: qty 2

## 2013-01-24 MED ORDER — ONDANSETRON HCL 4 MG/2ML IJ SOLN: 4.0000 mg | Freq: Three times a day (TID) | INTRAMUSCULAR | Status: DC | PRN

## 2013-01-24 MED ORDER — ASPIRIN EC 81 MG PO TBEC
81.0000 mg | DELAYED_RELEASE_TABLET | Freq: Every day | ORAL | Status: DC
  Administered 2013-01-25 – 2013-01-29 (×5): 81 mg via ORAL
  Filled 2013-01-24 (×5): qty 1

## 2013-01-24 MED ORDER — OXYCODONE-ACETAMINOPHEN 5-325 MG PO TABS
1.0000 | ORAL_TABLET | Freq: Four times a day (QID) | ORAL | Status: DC | PRN
  Administered 2013-01-24 – 2013-01-29 (×4): 1 via ORAL
  Filled 2013-01-24 (×5): qty 1

## 2013-01-24 MED ORDER — SODIUM POLYSTYRENE SULFONATE 15 GM/60ML PO SUSP
30.0000 g | Freq: Once | ORAL | Status: AC
  Administered 2013-01-24: 30 g via ORAL
  Filled 2013-01-24 (×2): qty 60

## 2013-01-24 MED ORDER — SIMVASTATIN 20 MG PO TABS
20.0000 mg | ORAL_TABLET | Freq: Every day | ORAL | Status: DC
  Administered 2013-01-25 – 2013-01-28 (×4): 20 mg via ORAL
  Filled 2013-01-24 (×5): qty 1

## 2013-01-24 MED ORDER — ZINC SULFATE 220 (50 ZN) MG PO CAPS
220.0000 mg | ORAL_CAPSULE | Freq: Every day | ORAL | Status: DC
  Administered 2013-01-25 – 2013-01-29 (×5): 220 mg via ORAL
  Filled 2013-01-24 (×5): qty 1

## 2013-01-24 MED ORDER — CALCIUM CARBONATE 1250 (500 CA) MG PO TABS
2.0000 | ORAL_TABLET | Freq: Every day | ORAL | Status: DC
  Administered 2013-01-25 – 2013-01-29 (×5): 1000 mg via ORAL
  Filled 2013-01-24 (×5): qty 2

## 2013-01-24 MED ORDER — HEPARIN SODIUM (PORCINE) 5000 UNIT/ML IJ SOLN
5000.0000 [IU] | Freq: Three times a day (TID) | INTRAMUSCULAR | Status: DC
  Administered 2013-01-25 – 2013-01-29 (×14): 5000 [IU] via SUBCUTANEOUS
  Filled 2013-01-24 (×19): qty 1

## 2013-01-24 MED ORDER — ISOSORBIDE MONONITRATE ER 30 MG PO TB24
30.0000 mg | ORAL_TABLET | Freq: Every day | ORAL | Status: DC
  Administered 2013-01-25 – 2013-01-29 (×5): 30 mg via ORAL
  Filled 2013-01-24 (×5): qty 1

## 2013-01-24 MED ORDER — SODIUM CHLORIDE 0.9 % IJ SOLN
3.0000 mL | Freq: Two times a day (BID) | INTRAMUSCULAR | Status: DC
  Administered 2013-01-25 – 2013-01-29 (×5): 3 mL via INTRAVENOUS

## 2013-01-24 MED ORDER — CALCITRIOL 0.25 MCG PO CAPS
0.2500 ug | ORAL_CAPSULE | Freq: Every day | ORAL | Status: DC
  Administered 2013-01-25 – 2013-01-29 (×5): 0.25 ug via ORAL
  Filled 2013-01-24 (×5): qty 1

## 2013-01-24 MED ORDER — INSULIN ASPART 100 UNIT/ML ~~LOC~~ SOLN
0.0000 [IU] | Freq: Three times a day (TID) | SUBCUTANEOUS | Status: DC
  Administered 2013-01-26: 1 [IU] via SUBCUTANEOUS
  Administered 2013-01-26 (×2): 2 [IU] via SUBCUTANEOUS
  Administered 2013-01-27 – 2013-01-28 (×4): 1 [IU] via SUBCUTANEOUS
  Administered 2013-01-28: 2 [IU] via SUBCUTANEOUS
  Administered 2013-01-29: 1 [IU] via SUBCUTANEOUS

## 2013-01-24 MED ORDER — TERBINAFINE HCL 1 % EX CREA
1.0000 "application " | TOPICAL_CREAM | Freq: Every day | CUTANEOUS | Status: DC
  Administered 2013-01-25 – 2013-01-28 (×3): 1 via TOPICAL
  Filled 2013-01-24: qty 12

## 2013-01-24 MED ORDER — ALBUTEROL SULFATE (5 MG/ML) 0.5% IN NEBU
5.0000 mg | INHALATION_SOLUTION | Freq: Once | RESPIRATORY_TRACT | Status: AC
  Administered 2013-01-24: 5 mg via RESPIRATORY_TRACT
  Filled 2013-01-24: qty 1

## 2013-01-24 MED ORDER — FUROSEMIDE 80 MG PO TABS: 240.0000 mg | ORAL_TABLET | Freq: Two times a day (BID) | ORAL | Status: DC

## 2013-01-24 MED ORDER — CYCLOBENZAPRINE HCL 10 MG PO TABS: 10.0000 mg | ORAL_TABLET | Freq: Three times a day (TID) | ORAL | Status: DC | PRN

## 2013-01-24 MED ORDER — CARVEDILOL 25 MG PO TABS
25.0000 mg | ORAL_TABLET | Freq: Two times a day (BID) | ORAL | Status: DC
  Administered 2013-01-25 – 2013-01-29 (×9): 25 mg via ORAL
  Filled 2013-01-24 (×11): qty 1

## 2013-01-24 MED ORDER — ONDANSETRON HCL 4 MG PO TABS: 4.0000 mg | ORAL_TABLET | Freq: Four times a day (QID) | ORAL | Status: DC | PRN

## 2013-01-24 MED ORDER — FERROUS SULFATE 325 (65 FE) MG PO TABS
325.0000 mg | ORAL_TABLET | Freq: Two times a day (BID) | ORAL | Status: DC
  Administered 2013-01-25 – 2013-01-29 (×10): 325 mg via ORAL
  Filled 2013-01-24 (×11): qty 1

## 2013-01-24 MED ORDER — FOLIC ACID 1 MG PO TABS
1.0000 mg | ORAL_TABLET | Freq: Every day | ORAL | Status: DC
  Administered 2013-01-25 – 2013-01-29 (×5): 1 mg via ORAL
  Filled 2013-01-24 (×5): qty 1

## 2013-01-24 MED ORDER — FUROSEMIDE 80 MG PO TABS
80.0000 mg | ORAL_TABLET | Freq: Every day | ORAL | Status: DC
  Filled 2013-01-24: qty 1

## 2013-01-24 MED ORDER — SODIUM CHLORIDE 0.9 % IJ SOLN
3.0000 mL | Freq: Two times a day (BID) | INTRAMUSCULAR | Status: DC
  Administered 2013-01-25 – 2013-01-29 (×6): 3 mL via INTRAVENOUS

## 2013-01-24 MED ORDER — DULOXETINE HCL 60 MG PO CPEP
60.0000 mg | ORAL_CAPSULE | Freq: Every day | ORAL | Status: DC
  Administered 2013-01-25 – 2013-01-29 (×5): 60 mg via ORAL
  Filled 2013-01-24 (×5): qty 1

## 2013-01-24 MED ORDER — ACETAMINOPHEN 650 MG RE SUPP: 650.0000 mg | Freq: Four times a day (QID) | RECTAL | Status: DC | PRN

## 2013-01-24 MED ORDER — ALBUTEROL SULFATE (5 MG/ML) 0.5% IN NEBU
2.5000 mg | INHALATION_SOLUTION | RESPIRATORY_TRACT | Status: DC
  Filled 2013-01-24: qty 0.5

## 2013-01-24 MED ORDER — SODIUM BICARBONATE 4 % IV SOLN
5.0000 mL | Freq: Once | INTRAVENOUS | Status: AC
  Administered 2013-01-24: 5 mL via INTRAVENOUS
  Filled 2013-01-24: qty 5

## 2013-01-24 MED ORDER — DEXTROSE 50 % IV SOLN
1.0000 | Freq: Once | INTRAVENOUS | Status: AC
  Administered 2013-01-24: 50 mL via INTRAVENOUS
  Filled 2013-01-24: qty 50

## 2013-01-24 MED ORDER — DOXYCYCLINE HYCLATE 100 MG IV SOLR
100.0000 mg | Freq: Two times a day (BID) | INTRAVENOUS | Status: DC
  Administered 2013-01-25 (×2): 100 mg via INTRAVENOUS
  Filled 2013-01-24 (×3): qty 100

## 2013-01-24 MED ORDER — TRAMADOL HCL 50 MG PO TABS
50.0000 mg | ORAL_TABLET | Freq: Two times a day (BID) | ORAL | Status: DC
  Administered 2013-01-25 – 2013-01-29 (×10): 50 mg via ORAL
  Filled 2013-01-24 (×13): qty 1

## 2013-01-24 MED ORDER — LEVOTHYROXINE SODIUM 25 MCG PO TABS
25.0000 ug | ORAL_TABLET | Freq: Every day | ORAL | Status: DC
  Administered 2013-01-25 – 2013-01-29 (×5): 25 ug via ORAL
  Filled 2013-01-24 (×6): qty 1

## 2013-01-24 NOTE — ED Notes (Signed)
Pt sent here for eval for hyperkalemia from PCP; pt with hx of renal issues and was at regular check up

## 2013-01-24 NOTE — H&P (Signed)
[facility] History and Physical  [name] [id number] DOB: May 10, 1961 DOA: 01/24/2013  Referring physician: Dr. Audie Pinto. PCP: [name], MD  Specialists: Dr. Florene Glen of nephrology.  Chief Complaint: Referred from the patient's PCP due to hyperkalemia.  HPI: [name] is a 52 y.o. female who had complicated course last December 2 months ago after patient had septic shock from pneumonia and UTI and had cardiac arrest and was on ventilator during that stay patient also briefly had dialysis presents today to the ER as patient was referred by patient's PCP once patient's blood work showed hyperkalemia. Patient otherwise states that she has no specific complaints. Denies any chest pain shortness of breath nausea vomiting abdominal pain fever chills dizziness loss of consciousness. Patient's labs were repeated the ER shows hyperkalemia with metabolic acidosis and no EKG changes. Patient also has leukocytosis but afebrile. 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 He does have drainage from the site but patient and husband states the drainage has not been increased.  ER physician has discussed with the nephrologist and at this time they have advised to give Kayexalate 30 g sodium bicarbonate IV D50 and insulin.  Review of Systems: As presented in the history of presenting illness nothing else significant.  Past Medical History  Diagnosis Date  . Systolic congestive heart failure   . SOB (shortness of breath)   . HTN (hypertension)   . DM type 2 (diabetes mellitus, type 2)   . Chronic kidney disease (CKD), stage III (moderate)   . Iron deficiency anemia   . Morbid obesity   . Hyperkalemia   . Hypothyroidism   . CHF (congestive heart failure)    Past Surgical History  Procedure Laterality Date  . Portacath placement    . Cystoscopy w/ ureteral stent placement  05/28/2012    Procedure: CYSTOSCOPY WITH RETROGRADE  PYELOGRAM/URETERAL STENT PLACEMENT;  Surgeon: Reece Packer, MD;  Location: [facility];  Service: Urology;  Laterality: Left;  . Cystoscopy w/ retrogrades  11/16/2012    Procedure: CYSTOSCOPY WITH RETROGRADE PYELOGRAM;  Surgeon: Reece Packer, MD;  Location: [facility];  Service: Urology;  Laterality: Bilateral;  CYSTOSCOPY,BILATERAL RETROGRADE PYELOGRAM/ REMOVAL LEFT URETERAL STENT/ FULGERATION BLADDER MUCOSA/ INSERTION RIGHT URETERAL STENT   Social History:  reports that she has never smoked. She has never used smokeless tobacco. She reports that she does not drink alcohol or use illicit drugs. Lives at home with her husband. where does patient live--home, ALF, SNF? and with whom if at home? Not sure. Can patient participate in ADLs?  Allergies  Allergen Reactions  . Amoxicillin-Pot Clavulanate Diarrhea  . Rosiglitazone Maleate     REACTION: swelling  . Amoxicillin Rash    Family History  Problem Relation Age of Onset  . Coronary artery disease Mother   . Hypertension Mother   . Diabetes type II Mother   . Malignant hyperthermia Mother   . Coronary artery disease Father   . Hypertension Father   . Malignant hyperthermia Father   . Cancer Maternal Grandfather     ? Type  . Kidney failure Maternal Grandmother      Prior to Admission medications   Medication Sig Start Date End Date Taking? Authorizing Provider  aspirin EC 81 MG EC tablet Take 1 tablet (81 mg total) by mouth daily. 01/30/12 01/29/13 Yes Ripudeep Krystal Eaton, MD  calcitRIOL (ROCALTROL) 0.25 MCG capsule Take 1 capsule (0.25 mcg total) by mouth daily. 12/03/12  Yes Janece Canterbury, MD  calcium carbonate (OS-CAL - DOSED IN MG OF ELEMENTAL CALCIUM) 1250 MG tablet Take 2 tablets (1,000 mg of elemental calcium total) by mouth daily. 12/03/12  Yes Janece Canterbury, MD  carvedilol (COREG) 25 MG tablet Take 1 tablet (25 mg total) by mouth 2 (two) times daily with a meal. 12/03/12  Yes Janece Canterbury, MD  cyclobenzaprine (FLEXERIL) 10 MG  tablet Take 1 tablet (10 mg total) by mouth 3 (three) times daily as needed for muscle spasms. For Spasms 12/03/12  Yes Janece Canterbury, MD  DULoxetine (CYMBALTA) 60 MG capsule Take 1 capsule (60 mg total) by mouth daily. 12/03/12  Yes Janece Canterbury, MD  feeding supplement (RESOURCE BREEZE) LIQD Take 1 Container by mouth 2 (two) times daily between meals. 12/03/12  Yes Janece Canterbury, MD  ferrous sulfate 325 (65 FE) MG tablet Take 1 tablet (325 mg total) by mouth 2 (two) times daily. 12/03/12  Yes Janece Canterbury, MD  folic acid (FOLVITE) 1 MG tablet Take 1 tablet (1 mg total) by mouth daily. 12/03/12  Yes Janece Canterbury, MD  furosemide (LASIX) 80 MG tablet Take 80 mg by mouth daily. 12/03/12  Yes Janece Canterbury, MD  isosorbide mononitrate (IMDUR) 30 MG 24 hr tablet Take 1 tablet (30 mg total) by mouth daily. 12/03/12  Yes Janece Canterbury, MD  levothyroxine (SYNTHROID, LEVOTHROID) 25 MCG tablet Take 1 tablet (25 mcg total) by mouth every morning. Take on an empty stomach 12/03/12  Yes Janece Canterbury, MD  magnesium oxide (MAG-OX) 400 MG tablet Take 400 mg by mouth 2 (two) times daily.    Yes Historical Provider, MD  oxyCODONE-acetaminophen (PERCOCET/ROXICET) 5-325 MG per tablet Take 1 tablet by mouth every 6 (six) hours as needed for pain. 12/03/12  Yes Janece Canterbury, MD  pravastatin (PRAVACHOL) 40 MG tablet Take 1 tablet (40 mg total) by mouth daily. 12/03/12  Yes Janece Canterbury, MD  saccharomyces boulardii (FLORASTOR) 250 MG capsule Take 1 capsule (250 mg total) by mouth 2 (two) times daily. 08/23/12  Yes Domenic Polite, MD  sodium hypochlorite (DAKIN'S 1/4 STRENGTH) 0.125 % SOLN Irrigate with 1 application as directed daily as needed. Apply to wound with dressing changes   Yes Historical Provider, MD  sulfamethoxazole-trimethoprim (BACTRIM,SEPTRA) 400-80 MG per tablet Take 1 tablet by mouth every 12 (twelve) hours. Start date 12/24/12; Duration unknown   Yes Historical Provider, MD  terbinafine (LAMISIL) 1 %  cream Apply 1 application topically daily. Applies under stomach 12/03/12  Yes Janece Canterbury, MD  traMADol (ULTRAM) 50 MG tablet Take 1 tablet (50 mg total) by mouth every 12 (twelve) hours. 12/03/12  Yes Janece Canterbury, MD  vitamin C (ASCORBIC ACID) 500 MG tablet Take 500 mg by mouth daily.   Yes Historical Provider, MD  zinc sulfate 220 MG capsule Take 220 mg by mouth daily.   Yes Historical Provider, MD   Physical Exam: Filed Vitals:   01/24/13 1806 01/24/13 2015 01/24/13 2045  BP: 136/54 109/49 122/60  Pulse: 70 66 65  Temp: 98.1 F (36.7 C)    TempSrc: Oral    Resp: 18 18 20   SpO2: 97% 100% 100%     General:  Well-developed well-nourished.  Eyes: Anicteric no pallor.  ENT: No discharge from ears eyes nose and mouth.  Neck: No mass felt.  Cardiovascular: S1-S2 heard.  Respiratory: No rhonchi no crepitations.  Abdomen: Soft nontender. In the groin area chronic skin changes are seen. I don't see any active discharge at this time but  there is a dressing with mild soiling.  Skin: See abdominal findings.  Musculoskeletal: Nontender.  Psychiatric: Appears normal.  Neurologic: Moves all extremities.  Labs on Admission:  Basic Metabolic Panel:  Recent Labs Lab 01/24/13 1920 01/24/13 2000  NA 141 137  K 6.8* 6.8*  CL 121* 112  CO2  --  12*  GLUCOSE 155* 150*  BUN 91* 86*  CREATININE 4.80* 4.32*  CALCIUM  --  9.0   Liver Function Tests: No results found for this basename: AST, ALT, ALKPHOS, BILITOT, PROT, ALBUMIN,  in the last 168 hours No results found for this basename: LIPASE, AMYLASE,  in the last 168 hours No results found for this basename: AMMONIA,  in the last 168 hours CBC:  Recent Labs Lab 01/24/13 1903 01/24/13 1920  WBC 17.5*  --   NEUTROABS 14.9*  --   HGB 8.4* 9.9*  HCT 27.3* 29.0*  MCV 94.1  --   PLT 279  --    Cardiac Enzymes: No results found for this basename: CKTOTAL, CKMB, CKMBINDEX, TROPONINI,  in the last 168 hours  BNP (last  3 results)  Recent Labs  05/22/12 0615 08/16/12 2049 11/10/12 1724  PROBNP 15652.0* 26806.0* 33908.0*   CBG: No results found for this basename: GLUCAP,  in the last 168 hours  Radiological Exams on Admission: No results found.  EKG: Independently reviewed. Normal sinus rhythm.  Assessment/Plan Principal Problem:   Renal failure (ARF), acute on chronic Active Problems:   Hidradenitis suppurativa   Metabolic acidosis   Leucocytosis   1. Acute on chronic renal failure with hyperkalemia - patient's creatinine at discharge on January was around 2 and now it is around 4. Patient is on Bactrim for Hiradenitis which could have worsened her creatinine and so at this time we will hold off. At this time we will continue with patient's present medications including Lasix. Pending renal sonogram. Closely follow intake output and metabolic panel. I have ordered repeat metabolic panel at 2 AM to check for potassium trends along with patient's bicarbonate. Nephrology to follow. Patient is to be on renal diet and dietitian consulted. Check urine sodium and creatinine and eosinophils. 2. Metabolic acidosis from renal failure - closely follow metabolic panel. 3. Leukocytosis - patient at this time does not look septic. Patient does have Hiradenitis and patient is oriented Bactrim which I have discontinued due to renal failure. I have placed patient on doxycycline IV for now. Closely follow. Patient chest x-ray is unremarkable and UA does not show any features of UTI. 4. Recent admission for septic shock from UTI and pneumonia. 5. Diabetes mellitus type 2 - patient is placed on sliding-scale coverage. 6. Hypothyroidism - continue Synthroid. Check TSH. 7. History of systolic CHF - last EF measured in December last was showing EF of 60-65%. Continue present medications.   Code Status: Full code. Family Communication: Discussed with patient's husband. Disposition Plan: Admit to inpatient.    [name] N. [facility] Pager [phone].  If 7PM-7AM, please contact night-coverage [email or web address] Password [facility] 01/24/2013, 10:44 PM

## 2013-01-24 NOTE — ED Provider Notes (Signed)
History     CSN: [id number]  Arrival date & time 01/24/13  1757   First MD Initiated Contact with Patient 01/24/13 2000      Chief Complaint  Patient presents with  . Abnormal Lab     HPI Pt sent here for eval for hyperkalemia from PCP; pt with hx of renal issues and was at regular check up  Past Medical History  Diagnosis Date  . Systolic congestive heart failure   . SOB (shortness of breath)   . HTN (hypertension)   . DM type 2 (diabetes mellitus, type 2)   . Chronic kidney disease (CKD), stage III (moderate)   . Iron deficiency anemia   . Morbid obesity   . Hyperkalemia   . Hypothyroidism   . CHF (congestive heart failure)     Past Surgical History  Procedure Laterality Date  . Portacath placement    . Cystoscopy w/ ureteral stent placement  05/28/2012    Procedure: CYSTOSCOPY WITH RETROGRADE PYELOGRAM/URETERAL STENT PLACEMENT;  Surgeon: Reece Packer, MD;  Location: [facility];  Service: Urology;  Laterality: Left;  . Cystoscopy w/ retrogrades  11/16/2012    Procedure: CYSTOSCOPY WITH RETROGRADE PYELOGRAM;  Surgeon: Reece Packer, MD;  Location: [facility];  Service: Urology;  Laterality: Bilateral;  CYSTOSCOPY,BILATERAL RETROGRADE PYELOGRAM/ REMOVAL LEFT URETERAL STENT/ FULGERATION BLADDER MUCOSA/ INSERTION RIGHT URETERAL STENT    Family History  Problem Relation Age of Onset  . Coronary artery disease Mother   . Hypertension Mother   . Diabetes type II Mother   . Malignant hyperthermia Mother   . Coronary artery disease Father   . Hypertension Father   . Malignant hyperthermia Father   . Cancer Maternal Grandfather     ? Type  . Kidney failure Maternal Grandmother     History  Substance Use Topics  . Smoking status: Never Smoker   . Smokeless tobacco: Never Used  . Alcohol Use: No    OB History   Grav Para Term Preterm Abortions TAB SAB Ect Mult Living                  Review of Systems All other systems reviewed and are negative Allergies   Amoxicillin-pot clavulanate; Rosiglitazone maleate; and Amoxicillin  Home Medications   Current Outpatient Rx  Name  Route  Sig  Dispense  Refill  . aspirin EC 81 MG EC tablet   Oral   Take 1 tablet (81 mg total) by mouth daily.   30 tablet   3   . calcitRIOL (ROCALTROL) 0.25 MCG capsule   Oral   Take 1 capsule (0.25 mcg total) by mouth daily.   30 capsule   0   . calcium carbonate (OS-CAL - DOSED IN MG OF ELEMENTAL CALCIUM) 1250 MG tablet   Oral   Take 2 tablets (1,000 mg of elemental calcium total) by mouth daily.   60 tablet   0   . carvedilol (COREG) 25 MG tablet   Oral   Take 1 tablet (25 mg total) by mouth 2 (two) times daily with a meal.   60 tablet   0   . cyclobenzaprine (FLEXERIL) 10 MG tablet   Oral   Take 1 tablet (10 mg total) by mouth 3 (three) times daily as needed for muscle spasms. For Spasms   30 tablet   0   . DULoxetine (CYMBALTA) 60 MG capsule   Oral   Take 1 capsule (60 mg total) by mouth daily.  30 capsule   0   . feeding supplement (RESOURCE BREEZE) LIQD   Oral   Take 1 Container by mouth 2 (two) times daily between meals.   30 Container   1   . ferrous sulfate 325 (65 FE) MG tablet   Oral   Take 1 tablet (325 mg total) by mouth 2 (two) times daily.   60 tablet   0   . folic acid (FOLVITE) 1 MG tablet   Oral   Take 1 tablet (1 mg total) by mouth daily.   30 tablet   0   . furosemide (LASIX) 80 MG tablet   Oral   Take 80 mg by mouth daily.         . isosorbide mononitrate (IMDUR) 30 MG 24 hr tablet   Oral   Take 1 tablet (30 mg total) by mouth daily.   30 tablet   0   . levothyroxine (SYNTHROID, LEVOTHROID) 25 MCG tablet   Oral   Take 1 tablet (25 mcg total) by mouth every morning. Take on an empty stomach   30 tablet   0   . magnesium oxide (MAG-OX) 400 MG tablet   Oral   Take 400 mg by mouth 2 (two) times daily.          Marland Kitchen oxyCODONE-acetaminophen (PERCOCET/ROXICET) 5-325 MG per tablet   Oral   Take 1  tablet by mouth every 6 (six) hours as needed for pain.         . pravastatin (PRAVACHOL) 40 MG tablet   Oral   Take 1 tablet (40 mg total) by mouth daily.   30 tablet   0   . saccharomyces boulardii (FLORASTOR) 250 MG capsule   Oral   Take 1 capsule (250 mg total) by mouth 2 (two) times daily.   30 capsule   0   . sodium hypochlorite (DAKIN'S 1/4 STRENGTH) 0.125 % SOLN   Irrigation   Irrigate with 1 application as directed daily as needed. Apply to wound with dressing changes         . sulfamethoxazole-trimethoprim (BACTRIM,SEPTRA) 400-80 MG per tablet   Oral   Take 1 tablet by mouth every 12 (twelve) hours. Start date 12/24/12; Duration unknown         . terbinafine (LAMISIL) 1 % cream   Topical   Apply 1 application topically daily. Applies under stomach         . traMADol (ULTRAM) 50 MG tablet   Oral   Take 1 tablet (50 mg total) by mouth every 12 (twelve) hours.   60 tablet   0   . vitamin C (ASCORBIC ACID) 500 MG tablet   Oral   Take 500 mg by mouth daily.         Marland Kitchen zinc sulfate 220 MG capsule   Oral   Take 220 mg by mouth daily.           BP 109/49  Pulse 66  Temp(Src) 98.1 F (36.7 C) (Oral)  Resp 18  SpO2 100%  Physical Exam  Nursing note and vitals reviewed. Constitutional: She is oriented to person, place, and time. She appears well-developed and well-nourished. No distress.  HENT:  Head: Normocephalic and atraumatic.  Eyes: Pupils are equal, round, and reactive to light.  Neck: Normal range of motion.  Cardiovascular: Normal rate and intact distal pulses.   Pulmonary/Chest: No respiratory distress.  Abdominal: Normal appearance. She exhibits no distension.  Musculoskeletal: Normal range of motion.  Neurological: She is alert and oriented to person, place, and time. No cranial nerve deficit.  Skin: Skin is warm and dry. No rash noted.  Psychiatric: She has a normal mood and affect. Her behavior is normal.    ED Course  Procedures  (including critical care time)  Labs Reviewed  CBC WITH DIFFERENTIAL - Abnormal; Notable for the following:    WBC 17.5 (*)    RBC 2.90 (*)    Hemoglobin 8.4 (*)    HCT 27.3 (*)    RDW 17.0 (*)    Neutrophils Relative 85 (*)    Neutro Abs 14.9 (*)    Lymphocytes Relative 6 (*)    Eosinophils Absolute 0.8 (*)    All other components within normal limits  BASIC METABOLIC PANEL - Abnormal; Notable for the following:    Potassium 6.8 (*)    CO2 12 (*)    Glucose, Bld 150 (*)    BUN 86 (*)    Creatinine, Ser 4.32 (*)    GFR calc non Af Amer 11 (*)    GFR calc Af Amer 13 (*)    All other components within normal limits  POCT I-STAT, CHEM 8 - Abnormal; Notable for the following:    Potassium 6.8 (*)    Chloride 121 (*)    BUN 91 (*)    Creatinine, Ser 4.80 (*)    Glucose, Bld 155 (*)    Calcium, Ion 1.27 (*)    Hemoglobin 9.9 (*)    HCT 29.0 (*)    All other components within normal limits  BLOOD GAS, ARTERIAL  URINALYSIS, ROUTINE W REFLEX MICROSCOPIC   Meds ordered this encounter  Medications  . sulfamethoxazole-trimethoprim (BACTRIM,SEPTRA) 400-80 MG per tablet    Sig: Take 1 tablet by mouth every 12 (twelve) hours. Start date 12/24/12; Duration unknown  . furosemide (LASIX) 80 MG tablet    Sig: Take 80 mg by mouth daily.  Marland Kitchen oxyCODONE-acetaminophen (PERCOCET/ROXICET) 5-325 MG per tablet    Sig: Take 1 tablet by mouth every 6 (six) hours as needed for pain.  Marland Kitchen terbinafine (LAMISIL) 1 % cream    Sig: Apply 1 application topically daily. Applies under stomach  . albuterol (PROVENTIL) (5 MG/ML) 0.5% nebulizer solution 5 mg    Sig:   . sodium polystyrene (KAYEXALATE) 15 GM/60ML suspension 30 g    Sig:   . dextrose 50 % solution 50 mL    Sig:   . insulin aspart (novoLOG) injection 5 Units    Sig:   . sodium bicarbonate (NEUT) 4 % injection 5 mL    Sig:     No results found.  Date: 01/24/2013  Rate: 69  Rhythm: normal sinus rhythm  QRS Axis: normal  Intervals:  normal  ST/T Wave abnormalities: normal  Conduction Disutrbances: none  Narrative Interpretation: unremarkable  CRITICAL CARE Performed by: Leonard Schwartz L   Total critical care time: 45 min  Critical care time was exclusive of separately billable procedures and treating other patients.  Critical care was necessary to treat or prevent imminent or life-threatening deterioration.  Critical care was time spent personally by me on the following activities: development of treatment plan with patient and/or surrogate as well as nursing, discussions with consultants, evaluation of patient's response to treatment, examination of patient, obtaining history from patient or surrogate, ordering and performing treatments and interventions, ordering and review of laboratory studies, ordering and review of radiographic studies, pulse oximetry and re-evaluation of patient's condition.  1. Hyperkalemia   2. Acute renal injury       MDM  Case discussed with nephrology and admitted to hospitalist       Dot Lanes, MD 01/24/13 2123

## 2013-01-25 ENCOUNTER — Inpatient Hospital Stay (HOSPITAL_COMMUNITY): Payer: BC Managed Care – PPO

## 2013-01-25 DIAGNOSIS — I10 Essential (primary) hypertension: Secondary | ICD-10-CM

## 2013-01-25 DIAGNOSIS — E119 Type 2 diabetes mellitus without complications: Secondary | ICD-10-CM

## 2013-01-25 LAB — BASIC METABOLIC PANEL
CO2: 13 mEq/L — ABNORMAL LOW (ref 19–32)
Calcium: 8.7 mg/dL (ref 8.4–10.5)
Calcium: 8.6 mg/dL (ref 8.4–10.5)
Calcium: 8.7 mg/dL (ref 8.4–10.5)
Creatinine, Ser: 4.11 mg/dL — ABNORMAL HIGH (ref 0.50–1.10)
Creatinine, Ser: 3.95 mg/dL — ABNORMAL HIGH (ref 0.50–1.10)
GFR calc Af Amer: 13 mL/min — ABNORMAL LOW (ref >90)
GFR calc non Af Amer: 11 mL/min — ABNORMAL LOW (ref >90)
GFR calc non Af Amer: 12 mL/min — ABNORMAL LOW (ref >90)
Glucose, Bld: 135 mg/dL — ABNORMAL HIGH (ref 70–99)
Sodium: 136 mEq/L (ref 135–145)
Sodium: 135 mEq/L (ref 135–145)

## 2013-01-25 LAB — URINALYSIS, ROUTINE W REFLEX MICROSCOPIC
Bilirubin Urine: NEGATIVE
Specific Gravity, Urine: 1.015 (ref 1.005–1.030)
pH: 5 (ref 5.0–8.0)

## 2013-01-25 LAB — CREATININE, URINE, RANDOM: Creatinine, Urine: 50.89 mg/dL

## 2013-01-25 LAB — BLOOD GAS, ARTERIAL
Bicarbonate: 12.1 mEq/L — ABNORMAL LOW (ref 20.0–24.0)
FIO2: 0.21 %
Patient temperature: 98.6
pH, Arterial: 7.152 — CL (ref 7.350–7.450)

## 2013-01-25 LAB — GLUCOSE, CAPILLARY
Glucose-Capillary: 128 mg/dL — ABNORMAL HIGH (ref 70–99)
Glucose-Capillary: 98 mg/dL (ref 70–99)
Glucose-Capillary: 96 mg/dL (ref 70–99)
Glucose-Capillary: 142 mg/dL — ABNORMAL HIGH (ref 70–99)

## 2013-01-25 LAB — MAGNESIUM: Magnesium: 2.3 mg/dL (ref 1.5–2.5)

## 2013-01-25 LAB — CBC
Platelets: 257 10*3/uL (ref 150–400)
RBC: 2.67 MIL/uL — ABNORMAL LOW (ref 3.87–5.11)
WBC: 17 10*3/uL — ABNORMAL HIGH (ref 4.0–10.5)

## 2013-01-25 MED ORDER — BOOST / RESOURCE BREEZE PO LIQD
1.0000 | Freq: Three times a day (TID) | ORAL | Status: DC
  Administered 2013-01-25 – 2013-01-28 (×8): 1 via ORAL

## 2013-01-25 MED ORDER — DEXTROSE 5 % IV SOLN
2.0000 g | INTRAVENOUS | Status: DC
  Administered 2013-01-25 – 2013-01-26 (×2): 2 g via INTRAVENOUS
  Filled 2013-01-25 (×2): qty 2

## 2013-01-25 MED ORDER — SODIUM POLYSTYRENE SULFONATE 15 GM/60ML PO SUSP
30.0000 g | Freq: Once | ORAL | Status: AC
  Administered 2013-01-25: 30 g via ORAL
  Filled 2013-01-25 (×3): qty 120

## 2013-01-25 MED ORDER — SODIUM POLYSTYRENE SULFONATE 15 GM/60ML PO SUSP
30.0000 g | Freq: Once | ORAL | Status: AC
  Administered 2013-01-25: 30 g via ORAL
  Filled 2013-01-25: qty 120

## 2013-01-25 MED ORDER — SODIUM BICARBONATE 8.4 % IV SOLN
50.0000 meq | Freq: Once | INTRAVENOUS | Status: AC
  Administered 2013-01-25: 50 meq via INTRAVENOUS
  Filled 2013-01-25 (×2): qty 50

## 2013-01-25 MED ORDER — SODIUM BICARBONATE 650 MG PO TABS
650.0000 mg | ORAL_TABLET | Freq: Two times a day (BID) | ORAL | Status: DC
  Administered 2013-01-25 – 2013-01-29 (×9): 650 mg via ORAL
  Filled 2013-01-25 (×10): qty 1

## 2013-01-25 MED ORDER — SODIUM BICARBONATE 8.4 % IV SOLN
INTRAVENOUS | Status: DC
  Administered 2013-01-25 – 2013-01-28 (×6): via INTRAVENOUS
  Filled 2013-01-25 (×10): qty 150

## 2013-01-25 MED ORDER — DEXTROSE 5 % IV SOLN
1.0000 g | INTRAVENOUS | Status: DC
  Filled 2013-01-25: qty 10

## 2013-01-25 MED ORDER — VANCOMYCIN HCL 10 G IV SOLR
2500.0000 mg | Freq: Once | INTRAVENOUS | Status: AC
  Administered 2013-01-25: 2500 mg via INTRAVENOUS
  Filled 2013-01-25: qty 2500

## 2013-01-25 MED ORDER — ALBUTEROL SULFATE (5 MG/ML) 0.5% IN NEBU: 2.5000 mg | INHALATION_SOLUTION | RESPIRATORY_TRACT | Status: DC | PRN

## 2013-01-25 NOTE — Progress Notes (Signed)
ANTIBIOTIC CONSULT NOTE - INITIAL  Pharmacy Consult for Ceftriaxone  Indication: UTI, possible PNA   Allergies  Allergen Reactions  . Amoxicillin-Pot Clavulanate Diarrhea  . Rosiglitazone Maleate     REACTION: swelling  . Amoxicillin Rash    Patient Measurements: Height: 5\' 7"  (170.2 cm) Weight: 270 lb 3.2 oz (122.562 kg) (scale c) IBW/kg (Calculated) : 61.6   Vital Signs: Temp: 99 F (37.2 C) (03/01 0431) Temp src: Oral (03/01 0431) BP: 120/51 mmHg (03/01 0431) Pulse Rate: 88 (03/01 0431) Intake/Output from previous day: 02/28 0701 - 03/01 0700 In: 493 [P.O.:240; I.V.:3; IV Piggyback:250] Out: -  Intake/Output from this shift:    Labs:  Recent Labs  01/24/13 1903 01/24/13 1920 01/24/13 2000 01/25/13 0156  WBC 17.5*  --   --  17.0*  HGB 8.4* 9.9*  --  7.8*  PLT 279  --   --  257  CREATININE  --  4.80* 4.32* 4.37*   Estimated Creatinine Clearance: 20.7 ml/min (by C-G formula based on Cr of 4.37). No results found for this basename: VANCOTROUGH, VANCOPEAK, VANCORANDOM, GENTTROUGH, GENTPEAK, GENTRANDOM, TOBRATROUGH, TOBRAPEAK, TOBRARND, AMIKACINPEAK, AMIKACINTROU, AMIKACIN,  in the last 72 hours   Microbiology: No results found for this or any previous visit (from the past 720 hour(s)).  Medical History: Past Medical History  Diagnosis Date  . Systolic congestive heart failure   . SOB (shortness of breath)   . HTN (hypertension)   . DM type 2 (diabetes mellitus, type 2)   . Chronic kidney disease (CKD), stage III (moderate)   . Iron deficiency anemia   . Morbid obesity   . Hyperkalemia   . Hypothyroidism   . CHF (congestive heart failure)     Assessment: 67 yof admitted 2/28 for hyperkalemia and AKI. Pharmacy consulted to start ceftriaxone for possible PNA/UTI with cultures pending. UA significant for many bacteria, large leukocytes, numerous WBC. WBC elevated at 17. Patient is obese, will give higher dose of ceftriaxone.   Goal of Therapy:   Eradication of infection   Plan:  1) Start ceftriaxone 2g IV q24h 2) Pharmacy will sign off since ceftriaxone does not need to be renally adjusted. Please contact pharmacy if further needed.  Thank you, Vertis Kelch, Pharm.D. Clinical Pharmacist   Pager: [phone] 01/25/2013 8:08 AM

## 2013-01-25 NOTE — Progress Notes (Signed)
ANTIBIOTIC CONSULT NOTE - INITIAL  Pharmacy Consult for Vancomycin  Indication: hidradenitis suppurativa  Allergies  Allergen Reactions  . Amoxicillin-Pot Clavulanate Diarrhea  . Rosiglitazone Maleate     REACTION: swelling  . Amoxicillin Rash    Patient Measurements: Height: 5\' 7"  (170.2 cm) Weight: 270 lb 3.2 oz (122.562 kg) (scale c) IBW/kg (Calculated) : 61.6  Vital Signs: Temp: 98.1 F (36.7 C) (03/01 1443) Temp src: Oral (03/01 1443) BP: 150/55 mmHg (03/01 1443) Pulse Rate: 92 (03/01 1443) Intake/Output from previous day: 02/28 0701 - 03/01 0700 In: 793 [P.O.:540; I.V.:3; IV Piggyback:250] Out: -  Intake/Output from this shift: Total I/O In: 420 [P.O.:420] Out: 200 [Urine:200]  Labs:  Recent Labs  01/24/13 1903  01/24/13 1920 01/24/13 2000 01/25/13 0156 01/25/13 1052 01/25/13 1334  WBC 17.5*  --   --   --  17.0*  --   --   HGB 8.4*  --  9.9*  --  7.8*  --   --   PLT 279  --   --   --  257  --   --   LABCREA  --   --   --   --   --   --  50.89  CREATININE  --   < > 4.80* 4.32* 4.37* 4.11*  --   < > = values in this interval not displayed. Estimated Creatinine Clearance: 22 ml/min (by C-G formula based on Cr of 4.11). No results found for this basename: VANCOTROUGH, VANCOPEAK, VANCORANDOM, GENTTROUGH, GENTPEAK, GENTRANDOM, TOBRATROUGH, TOBRAPEAK, TOBRARND, AMIKACINPEAK, AMIKACINTROU, AMIKACIN,  in the last 72 hours   Microbiology: No results found for this or any previous visit (from the past 720 hour(s)).  Medical History: Past Medical History  Diagnosis Date  . Systolic congestive heart failure   . SOB (shortness of breath)   . HTN (hypertension)   . DM type 2 (diabetes mellitus, type 2)   . Chronic kidney disease (CKD), stage III (moderate)   . Iron deficiency anemia   . Morbid obesity   . Hyperkalemia   . Hypothyroidism   . CHF (congestive heart failure)    Assessment: Miss Gorka is a 4 YOF with a stage 4 ckd and a history of hidradenitis  suppurativa. She also has history of septic shock and UTI during a recent admission 2 months ago. She presents on this hospital admission with elevated potassium and leukocytosis and pharmacy was consulted to dose antibiotics. She was on bactrim PTA.   Noted that first line treatment for hidradenitis suppurativa is a tetracycline while the 2nd line is clindamycin. Patient was put on doxycycline from 2/28 to 3/1 and now to change to vancomycin because of multiple cutaneous locations. Patient has been on vancomycin in the past (07/2012) and pharmacy was dosing based on random levels. Will likely have to do such on this admit as well. She is also on ceftriaxone to cover for UTI (hx of Ecoli).   Patient is obese (122kg) and Scr is 4.11 (baseline 2.8). Nephrology has been consulted but no current plans for RRT.   Goal of Therapy:  Vancomycin trough level 10-15 mcg/ml  Plan:  -Begin vancomycin with a 2500mg  loading dose -F/U with a random vancomycin level in 48 hours  -F/U renal function and RRT requirements  Thank you,  Francesca Jewett, PharmD, BCPS 01/25/2013 4:15 PM

## 2013-01-25 NOTE — Progress Notes (Signed)
CRITICAL VALUE ALERT  Critical value received:  ABG RESULTS: pH 7.152 CO2 36 PO2 81.8 BICARB 12  Date of notification:  01/25/13  Time of notification:  0100  Critical value read back:yes  Nurse who received alert:  Mallie Mussel  MD notified (1st page):  Walden Field, PA  Time of first page:  0105  MD notified (2nd page):  Time of second page:  Responding MD:  Walden Field, PA  Time MD responded:  [phone]

## 2013-01-25 NOTE — Progress Notes (Signed)
1200 refused foley cath insertion. I and O srtrictly monitored . Pt compliant

## 2013-01-25 NOTE — Consult Note (Signed)
WOC consult Note Reason for Consult:hiradenitis with drainage.  Pt and her mother report extensive hx and treatments for this problem. Including surgical consultation.  Hiradenitis management is very difficult and in my experience surgery is the only real option and this can still result in recurrence, which is why this pt is choosing at this time to not consider surgery.  Initially we discussed silvadene or use of silver dressings however the pt reports these have been tried in the past and were not successful.  Pt was evaluated at [facility] for full workup for this diagnosis as well.   Wound type: open lesions from hiradenitis  Under her pannus several lesions, under gluteal skin folds, under left breast, several in the groin, in skin fold of the left posterior chest Drainage (amount, consistency, odor) yellow, sanguinous, some purulents  Periwound: intact Dressing procedure/placement/frequency: ABD pads to control drainage, no other topical care at this time as pt reports they have not been successful. Pt does not wish for surgical consultation at this time.    Re consult if needed, will not follow at this time. Thanks  Vaudie Engebretsen [place], [place] [phone])

## 2013-01-25 NOTE — Consult Note (Signed)
Modell Eisaman 01/25/2013 [place] Ch[place] D Requesting Physician:  Dr Ardeth Perfect  Reason for Consult:  Acute on CRF HPI: The patient is a 52 y.o. year-old with hx of CKD IV usual creatinine around 2.5-3.5, admitted from PCP's office for high K+ and abnormal labs. She also has a long hx of hidadenitis suppurative involving the perineum, labia, inner thighs and buttocks.   Patient was here in Dec '13 with PNA, septic shock, UTI and cardiac arrest. She was on the vent and required temporary dialysis. Creatinine improved down to 2.8 at discharge.  She has a history of kindey stones and had a L ureteral stent place in July 2013.  In December 2013 she had cystoscopy with removal of L ureteral stent and placement of R ureteral stent for ?obstructionn.  Patient denies lethargy, excessiveness tiredness. Nocturia 3 times/night. No dysuria , no odor to urine, no SP pain. Has had recent UTI's. No cp, SOB or orthopnea. No cough, no ankle swelling. No OTC meds, no NSAID's.  No nausea or vomiting except when sinuses drain.  Appetite fine. Not doing as much around the house as usual , but husband says it is about average.   Sees Dr. Florene Glen for ESRD for about 3 years, about every 6 months. Does not have long term access  Mar 2013 >> acute/chronic CHF, CKD III, hidradenitis suppurativa  May 2013 >> SOB due to acute syst HF, + severe pulm [facility]  July 2013 >>  Acute on chronic CHF exacerbation, placement of L ureteral stent for ureteral calculi/left hydronephrosis, Dr Matilde Sprang, multiple bilat renal calculi noted  Sept 2013 >>  Anemia, Hb 6, severe HS, referred to [facility] for [facility]  11/10/12-12/03/12 >>  EColi urosepsis and LLL PNA, R hydro with R ureteral stent placed and old L ureteral stent removed. Was in ICU on vent and in septic shock. Required mult transfusions for anemia. Had 3-min cardiac arrest (?asystole) then resuscitated.  ROS  no skin rash  no joint pain   no focal weakness  no leg edema   Past Medical  History:  Past Medical History  Diagnosis Date  . Systolic congestive heart failure   . SOB (shortness of breath)   . HTN (hypertension)   . DM type 2 (diabetes mellitus, type 2)   . Chronic kidney disease (CKD), stage III (moderate)   . Iron deficiency anemia   . Morbid obesity   . Hyperkalemia   . Hypothyroidism   . CHF (congestive heart failure)     Past Surgical History:  Past Surgical History  Procedure Laterality Date  . Portacath placement    . Cystoscopy w/ ureteral stent placement  05/28/2012    Procedure: CYSTOSCOPY WITH RETROGRADE PYELOGRAM/URETERAL STENT PLACEMENT;  Surgeon: Reece Packer, MD;  Location: [facility];  Service: Urology;  Laterality: Left;  . Cystoscopy w/ retrogrades  11/16/2012    Procedure: CYSTOSCOPY WITH RETROGRADE PYELOGRAM;  Surgeon: Reece Packer, MD;  Location: [facility];  Service: Urology;  Laterality: Bilateral;  CYSTOSCOPY,BILATERAL RETROGRADE PYELOGRAM/ REMOVAL LEFT URETERAL STENT/ FULGERATION BLADDER MUCOSA/ INSERTION RIGHT URETERAL STENT  . Port-a-cath removal      Family History:  Family History  Problem Relation Age of Onset  . Coronary artery disease Mother   . Hypertension Mother   . Diabetes type II Mother   . Malignant hyperthermia Mother   . Coronary artery disease Father   . Hypertension Father   . Malignant hyperthermia Father   . Cancer Maternal Grandfather     ?  Type  . Kidney failure Maternal Grandmother    Social History:  reports that she has never smoked. She has never used smokeless tobacco. She reports that she does not drink alcohol or use illicit drugs.  Allergies:  Allergies  Allergen Reactions  . Amoxicillin-Pot Clavulanate Diarrhea  . Rosiglitazone Maleate     REACTION: swelling  . Amoxicillin Rash    Home medications: Prior to Admission medications   Medication Sig Start Date End Date Taking? Authorizing Provider  aspirin EC 81 MG EC tablet Take 1 tablet (81 mg total) by mouth daily. 01/30/12 01/29/13  Yes Ripudeep Krystal Eaton, MD  calcitRIOL (ROCALTROL) 0.25 MCG capsule Take 1 capsule (0.25 mcg total) by mouth daily. 12/03/12  Yes Janece Canterbury, MD  calcium carbonate (OS-CAL - DOSED IN MG OF ELEMENTAL CALCIUM) 1250 MG tablet Take 2 tablets (1,000 mg of elemental calcium total) by mouth daily. 12/03/12  Yes Janece Canterbury, MD  carvedilol (COREG) 25 MG tablet Take 1 tablet (25 mg total) by mouth 2 (two) times daily with a meal. 12/03/12  Yes Janece Canterbury, MD  cyclobenzaprine (FLEXERIL) 10 MG tablet Take 1 tablet (10 mg total) by mouth 3 (three) times daily as needed for muscle spasms. For Spasms 12/03/12  Yes Janece Canterbury, MD  DULoxetine (CYMBALTA) 60 MG capsule Take 1 capsule (60 mg total) by mouth daily. 12/03/12  Yes Janece Canterbury, MD  feeding supplement (RESOURCE BREEZE) LIQD Take 1 Container by mouth 2 (two) times daily between meals. 12/03/12  Yes Janece Canterbury, MD  ferrous sulfate 325 (65 FE) MG tablet Take 1 tablet (325 mg total) by mouth 2 (two) times daily. 12/03/12  Yes Janece Canterbury, MD  folic acid (FOLVITE) 1 MG tablet Take 1 tablet (1 mg total) by mouth daily. 12/03/12  Yes Janece Canterbury, MD  furosemide (LASIX) 80 MG tablet Take 80 mg by mouth daily. 12/03/12  Yes Janece Canterbury, MD  isosorbide mononitrate (IMDUR) 30 MG 24 hr tablet Take 1 tablet (30 mg total) by mouth daily. 12/03/12  Yes Janece Canterbury, MD  levothyroxine (SYNTHROID, LEVOTHROID) 25 MCG tablet Take 1 tablet (25 mcg total) by mouth every morning. Take on an empty stomach 12/03/12  Yes Janece Canterbury, MD  magnesium oxide (MAG-OX) 400 MG tablet Take 400 mg by mouth 2 (two) times daily.    Yes Historical Provider, MD  oxyCODONE-acetaminophen (PERCOCET/ROXICET) 5-325 MG per tablet Take 1 tablet by mouth every 6 (six) hours as needed for pain. 12/03/12  Yes Janece Canterbury, MD  pravastatin (PRAVACHOL) 40 MG tablet Take 1 tablet (40 mg total) by mouth daily. 12/03/12  Yes Janece Canterbury, MD  saccharomyces boulardii (FLORASTOR) 250 MG  capsule Take 1 capsule (250 mg total) by mouth 2 (two) times daily. 08/23/12  Yes Domenic Polite, MD  sodium hypochlorite (DAKIN'S 1/4 STRENGTH) 0.125 % SOLN Irrigate with 1 application as directed daily as needed. Apply to wound with dressing changes   Yes Historical Provider, MD  sulfamethoxazole-trimethoprim (BACTRIM,SEPTRA) 400-80 MG per tablet Take 1 tablet by mouth every 12 (twelve) hours. Start date 12/24/12; Duration unknown   Yes Historical Provider, MD  terbinafine (LAMISIL) 1 % cream Apply 1 application topically daily. Applies under stomach 12/03/12  Yes Janece Canterbury, MD  traMADol (ULTRAM) 50 MG tablet Take 1 tablet (50 mg total) by mouth every 12 (twelve) hours. 12/03/12  Yes Janece Canterbury, MD  vitamin C (ASCORBIC ACID) 500 MG tablet Take 500 mg by mouth daily.   Yes Historical Provider, MD  zinc sulfate  220 MG capsule Take 220 mg by mouth daily.   Yes Historical Provider, MD    Labs: Basic Metabolic Panel:  Recent Labs Lab 01/24/13 1920 01/24/13 2000 01/25/13 0156  NA 141 137 136  K 6.8* 6.8* 6.4*  CL 121* 112 111  CO2  --  12* 13*  GLUCOSE 155* 150* 139*  BUN 91* 86* 87*  CREATININE 4.80* 4.32* 4.37*  CALCIUM  --  9.0 8.7   Liver Function Tests: No results found for this basename: AST, ALT, ALKPHOS, BILITOT, PROT, ALBUMIN,  in the last 168 hours No results found for this basename: LIPASE, AMYLASE,  in the last 168 hours No results found for this basename: AMMONIA,  in the last 168 hours CBC:  Recent Labs Lab 01/24/13 1903 01/24/13 1920 01/25/13 0156  WBC 17.5*  --  17.0*  NEUTROABS 14.9*  --   --   HGB 8.4* 9.9* 7.8*  HCT 27.3* 29.0* 25.1*  MCV 94.1  --  94.0  PLT 279  --  257   PT/INR: @LABRCNTIP (inr:5) Cardiac Enzymes: )No results found for this basename: CKTOTAL, CKMB, CKMBINDEX, TROPONINI,  in the last 168 hours CBG:  Recent Labs Lab 01/25/13 0130 01/25/13 0650  GLUCAP 128* 98     Physical Exam:  Blood pressure 123/73, pulse 80, temperature  98.1 F (36.7 C), temperature source Oral, resp. rate 18, height 5\' 7"  (1.702 m), weight 122.562 kg (270 lb 3.2 oz), last menstrual period 12/24/2012, SpO2 100.00%.  Gen: alert, in no distress Skin: no rash, cyanosis HEENT:  EOMI, sclera anicteric, throat clear and moist Neck: flat neck veins, no JVD, no LAN Chest: clear bilat, no rales or rhonchi CV: regular, no rub or gallop, no carotid bruits, pedal pulses intact Abdomen: soft, marked obesity, no ascites, liver down 4cm, +BS Ext: no LE edema, no joint effusion or deformity, no gangrene or ulceration Neuro: alert, Ox3, no focal deficit, no asterixis  Renal US- stent on R side noted, not hydro either side, ^'d echogenicity due to medical renal disease UA- 100 prot, TNTC wbc and 21-50 rbc, many bact  Impression/Plan 1. Acute on CKD IV- may be due to vol depletion. No evidence hydro on [place] from last night. Looks on dry side, agree with bicarb gtt at 75 cc/hr. Started po NaHCO3 also. Not uremic and does not require RRT now.   Will follow.  2. CKD IV 3. Hx hidradenitis suppuritiva 4. Hx syst CHF 5. Hx bilat renal stones- R stent in place, hx L stent last year also 6. Obesity 7. DM 2 8. HTN  Kelly Splinter  MD [facility] [phone] pgr    [phone] cell 01/25/2013, 11:33 AM

## 2013-01-25 NOTE — Progress Notes (Signed)
INITIAL NUTRITION ASSESSMENT  DOCUMENTATION CODES Per approved criteria  -Morbid Obesity   INTERVENTION:  Resource Breeze po TID, each supplement provides 250 kcal and 9 grams of protein.  NUTRITION DIAGNOSIS: Inadequate oral intake related to poor appetite as evidenced by poor intake of meals since admission and 22% weight loss over the past 2 months.   Goal: Intake to meet >90% of estimated nutrition needs.  Monitor:  PO intake, labs, weight trend.  Reason for Assessment: MD Consult for diet education; MST=4  52 y.o. female  Admitting Dx: Renal failure (ARF), acute on chronic  ASSESSMENT: Patient has been eating well at home per discussion with mother and husband.  Mother and husband are the ones who prepare food for patient.  For the past 2 weeks, patient has had nausea and vomiting multiple times throughout each day.  Suspect some of weight loss is related to fluids, however, patient has had severe weight loss of 22% of usual weight in the past 2 months.  Patient is at nutrition risk, given weight loss and perineal skin breakdown.  Height: Ht Readings from Last 1 Encounters:  01/24/13 5\' 7"  (1.702 m)    Weight: Wt Readings from Last 1 Encounters:  01/24/13 270 lb 3.2 oz (122.562 kg)    Ideal Body Weight: 61.4 kg  % Ideal Body Weight: 200%  Wt Readings from Last 10 Encounters:  01/24/13 270 lb 3.2 oz (122.562 kg)  12/02/12 344 lb 5.7 oz (156.2 kg)  12/02/12 344 lb 5.7 oz (156.2 kg)  08/22/12 366 lb 10 oz (166.3 kg)  06/03/12 357 lb 5.9 oz (162.1 kg)  06/03/12 357 lb 5.9 oz (162.1 kg)  05/23/12 350 lb (158.759 kg)  04/07/12 379 lb 13.6 oz (172.3 kg)  03/07/12 338 lb (153.316 kg)  01/30/12 357 lb 12.9 oz (162.3 kg)    Usual Body Weight: 344 lb  % Usual Body Weight: 78%  BMI:  Body mass index is 42.31 kg/(m^2). class 3, extreme obesity  Estimated Nutritional Needs: Kcal: 1900-2100 Protein: 60-80 gm (with CKD) Fluid: 2 L  Skin: wound on  perineum  Diet Order: Renal 60/70-2-2  EDUCATION NEEDS: -Education needs addressed-discussed foods that are high in potassium to limit/avoid.  Renal diet Handout provided.  Teach back method used.   Intake/Output Summary (Last 24 hours) at 01/25/13 1216 Last data filed at 01/25/13 0645  Gross per 24 hour  Intake    793 ml  Output      0 ml  Net    793 ml    Last BM: 3/1   Labs:   Recent Labs Lab 01/24/13 1920 01/24/13 2000 01/25/13 0156  NA 141 137 136  K 6.8* 6.8* 6.4*  CL 121* 112 111  CO2  --  12* 13*  BUN 91* 86* 87*  CREATININE 4.80* 4.32* 4.37*  CALCIUM  --  9.0 8.7  MG  --   --  2.3  GLUCOSE 155* 150* 139*    CBG (last 3)   Recent Labs  01/25/13 0130 01/25/13 0650 01/25/13 1127  GLUCAP 128* 98 96    Scheduled Meds: . aspirin EC  81 mg Oral Daily  . calcitRIOL  0.25 mcg Oral Daily  . calcium carbonate  2 tablet Oral Daily  . carvedilol  25 mg Oral BID WC  . cefTRIAXone (ROCEPHIN)  IV  2 g Intravenous Q24H  . doxycycline (VIBRAMYCIN) IV  100 mg Intravenous Q12H  . DULoxetine  60 mg Oral Daily  . ferrous sulfate  325 mg Oral BID  . folic acid  1 mg Oral Daily  . heparin  5,000 Units Subcutaneous Q8H  . insulin aspart  0-9 Units Subcutaneous TID WC  . isosorbide mononitrate  30 mg Oral Daily  . levothyroxine  25 mcg Oral QAC breakfast  . saccharomyces boulardii  250 mg Oral BID  . simvastatin  20 mg Oral q1800  . sodium bicarbonate  650 mg Oral BID  . sodium chloride  3 mL Intravenous Q12H  . sodium chloride  3 mL Intravenous Q12H  . sodium polystyrene  30 g Oral Once  . terbinafine  1 application Topical Daily  . traMADol  50 mg Oral Q12H  . zinc sulfate  220 mg Oral Daily    Continuous Infusions: .  sodium bicarbonate  infusion 1000 mL 75 mL/hr at 01/25/13 1000    Past Medical History  Diagnosis Date  . Systolic congestive heart failure   . SOB (shortness of breath)   . HTN (hypertension)   . DM type 2 (diabetes mellitus, type 2)    . Chronic kidney disease (CKD), stage III (moderate)   . Iron deficiency anemia   . Morbid obesity   . Hyperkalemia   . Hypothyroidism   . CHF (congestive heart failure)     Past Surgical History  Procedure Laterality Date  . Portacath placement    . Cystoscopy w/ ureteral stent placement  05/28/2012    Procedure: CYSTOSCOPY WITH RETROGRADE PYELOGRAM/URETERAL STENT PLACEMENT;  Surgeon: Reece Packer, MD;  Location: [facility];  Service: Urology;  Laterality: Left;  . Cystoscopy w/ retrogrades  11/16/2012    Procedure: CYSTOSCOPY WITH RETROGRADE PYELOGRAM;  Surgeon: Reece Packer, MD;  Location: [facility];  Service: Urology;  Laterality: Bilateral;  CYSTOSCOPY,BILATERAL RETROGRADE PYELOGRAM/ REMOVAL LEFT URETERAL STENT/ FULGERATION BLADDER MUCOSA/ INSERTION RIGHT URETERAL STENT  . Port-a-cath removal      Molli Barrows, RD, LDN, CNSC Pager# [phone] After Hours Pager# [phone]

## 2013-01-25 NOTE — Progress Notes (Signed)
[facility] PROGRESS NOTE Alvin Diffee L. Ardeth Perfect, MD               Pager [phone] (if 7P to 7A, page night hospitalist on [email or web address] Garnet EX:[id number] DOB: Feb 01, 1961 DOA: 01/24/2013 PCP: [name], MD  Assessment/Plan: 1. Acute on chronic renal failure with hyperkalemia - - patient's creatinine has increased acutely from 2.8 to 4.4 in the past 2 months.  Changed the patients bactrim to doxy for treatment of Hiradenitis, to avoid the potential nephrotoxicity of the bactrim. Holding Lasix 80mg  po daily, can given prn basis.Renal U/S r/o'd obstruction. BUN/Cr ratio is elevated indicating likely dehydration, being hydrated at 75cc/hr w/ bicarb ggt in D50.  Place foley, strict I/Os and daily weights.BMP q8hrs to trend bicarb, K, etc. Will cont kayexylate prn hyperkalemia. Check FeUrea.  Nephrology to follow, appreciate recs. Patient is to be on renal diet and dietitian consulted. Check urine sodium and creatinine and eosinophils. 2. Metabolic acidosis from renal failure - closely follow metabolic panel.  ABG showed pH of 7.152 w/ HCO3 of 12 overnight. Start on bicarb drip at 75 cc/hr. nephro consulted. BMP q8 hrs. 3. Leukocytosis - Note had recent admission for septic shock from UTI and pneumonia. Possible sources are urine (u/a showing mod bacteria), skin w/ hx of Hiradenitis, and PNA. Bactrim changed to doxy for MRSA coverage. Started on Rocephin to cover urine. Will follow CXR results. Will hold BP meds if becomes hypotensive 4. Diabetes mellitus type 2 - patient is placed on sliding-scale coverage.Adequate control overnight.  5. Hypothyroidism - continue Synthroid. Check TSH. 6. History of systolic CHF - last EF measured in December last was showing EF of 60-65%. Holding lasix 80mg  daily during AKI.    Principal Problem:   Renal failure (ARF), acute on chronic Active Problems:   Hidradenitis suppurativa   Metabolic acidosis   Leucocytosis   Code Status: Full Code  Disposition  Plan: anticipate 5-7 days   Velna Hatchet, MD  Internal Medicine Pager [phone].  If 7PM-7AM, please contact night-coverage at [email or web address], password [facility] 01/25/2013, 7:18 AM   LOS: 1 day   Brief narrative: Pt admitted for hyperkalemia and acute on chronic renal failure. Also noted to have leukocytosis. Of note, had recent admit for sepsis and AKI 2/2 PNA and UTI.   Consultants:  Nephrology 01/25/13  Procedures:  None   Antibiotics:  Doxycycline 2/28 >>  Ceftriaxone 3/1 >> ?  HPI/Subjective: No complaints this AM. Laying flat w/o dyspnea.   Objective: Filed Vitals:   01/24/13 2015 01/24/13 2045 01/24/13 2300 01/25/13 0431  BP: 109/49 122/60 101/54 120/51  Pulse: 66 65 83 88  Temp:   98 F (36.7 C) 99 F (37.2 C)  TempSrc:   Oral Oral  Resp: 18 20 18 21   Height:   5\' 7"  (1.702 m)   Weight:   122.562 kg (270 lb 3.2 oz)   SpO2: 100% 100% 100% 100%    Intake/Output Summary (Last 24 hours) at 01/25/13 0718 Last data filed at 01/25/13 0032  Gross per 24 hour  Intake    493 ml  Output      0 ml  Net    493 ml    Exam:  General: Well-developed well-nourished.  Eyes: Anicteric no pallor.  ENT: No discharge from ears eyes nose and mouth.  Neck: No mass felt.  Cardiovascular: S1-S2 heard.  Respiratory: No rhonchi no crepitations.  Abdomen: Soft nontender. In the groin area chronic skin changes are seen. I don't  see any active discharge at this time but there is a dressing with mild soiling.  Skin: See abdominal findings.  Musculoskeletal: Nontender.  Psychiatric: Appears normal.  Neurologic: Moves all extremities.  Data Reviewed: Basic Metabolic Panel:  Recent Labs Lab 01/24/13 1920 01/24/13 2000 01/25/13 0156  NA 141 137 136  K 6.8* 6.8* 6.4*  CL 121* 112 111  CO2  --  12* 13*  GLUCOSE 155* 150* 139*  BUN 91* 86* 87*  CREATININE 4.80* 4.32* 4.37*  CALCIUM  --  9.0 8.7  MG  --   --  2.3   Liver Function Tests: No results found for this  basename: AST, ALT, ALKPHOS, BILITOT, PROT, ALBUMIN,  in the last 168 hours No results found for this basename: LIPASE, AMYLASE,  in the last 168 hours No results found for this basename: AMMONIA,  in the last 168 hours CBC:  Recent Labs Lab 01/24/13 1903 01/24/13 1920 01/25/13 0156  WBC 17.5*  --  17.0*  NEUTROABS 14.9*  --   --   HGB 8.4* 9.9* 7.8*  HCT 27.3* 29.0* 25.1*  MCV 94.1  --  94.0  PLT 279  --  257   Cardiac Enzymes: No results found for this basename: CKTOTAL, CKMB, CKMBINDEX, TROPONINI,  in the last 168 hours BNP: No components found with this basename: POCBNP,  CBG:  Recent Labs Lab 01/25/13 0130 01/25/13 0650  GLUCAP 128* 98    No results found for this or any previous visit (from the past 240 hour(s)).   Studies: US Renal  01/24/2013  *RADIOLOGY REPORT*  Clinical Data: Abnormal labs  RENAL/URINARY TRACT ULTRASOUND COMPLETE  Comparison:  12/31/2012  Findings:  Right Kidney:  Increased echogenicity. Portions of the lower pole are obscured by bowel gas artifact.  Allowing for this, no focal abnormality.  No hydronephrosis.  Measures 11.7 cm.  Left Kidney:  Increased echogenicity.  No focal abnormality.  No hydronephrosis.  Measures 10.1 cm. Nephro ureteral stent noted.  Bladder:  Within normal limits. The ureteral stent extends into the bladder.  IMPRESSION: Echogenic kidneys suggesting underlying medical renal disease.  No hydronephrosis.  Left Nephro ureteral stent.   Original Report Authenticated By: Carlos Levering, M.D.    US Renal  12/31/2012  *RADIOLOGY REPORT*  Clinical Data: Chronic kidney disease.  Rule out hydronephrosis.  RENAL/URINARY TRACT ULTRASOUND COMPLETE  Comparison:  CT of 11/12/2012.  Renal ultrasound 11/11/2012.  Findings:  Right Kidney:  11.8 cm. No hydronephrosis.  Increased echogenicity.  Left Kidney:  12.3 cm. No hydronephrosis.  Increased echogenicity.  Bladder:  Within normal limits.  Incidental note is made of small volume perihepatic  ascites.  IMPRESSION:  1. No hydronephrosis. 2.  Increased echogenicity of the kidneys, suggesting medical renal disease. 3.  Small volume ascites.   Original Report Authenticated By: Abigail Miyamoto, M.D.     Scheduled Meds: . aspirin EC  81 mg Oral Daily  . calcitRIOL  0.25 mcg Oral Daily  . calcium carbonate  2 tablet Oral Daily  . carvedilol  25 mg Oral BID WC  . doxycycline (VIBRAMYCIN) IV  100 mg Intravenous Q12H  . DULoxetine  60 mg Oral Daily  . ferrous sulfate  325 mg Oral BID  . folic acid  1 mg Oral Daily  . furosemide  80 mg Oral Daily  . heparin  5,000 Units Subcutaneous Q8H  . insulin aspart  0-9 Units Subcutaneous TID WC  . isosorbide mononitrate  30 mg Oral Daily  .  levothyroxine  25 mcg Oral QAC breakfast  . saccharomyces boulardii  250 mg Oral BID  . simvastatin  20 mg Oral q1800  . sodium chloride  3 mL Intravenous Q12H  . sodium chloride  3 mL Intravenous Q12H  . terbinafine  1 application Topical Daily  . traMADol  50 mg Oral Q12H  . zinc sulfate  220 mg Oral Daily   Continuous Infusions:  Bicarb ggt at 75cc/hr    Time Spent: 45 minutes

## 2013-01-26 LAB — BASIC METABOLIC PANEL
BUN: 77 mg/dL — ABNORMAL HIGH (ref 6–23)
BUN: 78 mg/dL — ABNORMAL HIGH (ref 6–23)
Calcium: 7.9 mg/dL — ABNORMAL LOW (ref 8.4–10.5)
Calcium: 8.1 mg/dL — ABNORMAL LOW (ref 8.4–10.5)
Calcium: 7.9 mg/dL — ABNORMAL LOW (ref 8.4–10.5)
GFR calc Af Amer: 14 mL/min — ABNORMAL LOW (ref >90)
GFR calc non Af Amer: 12 mL/min — ABNORMAL LOW (ref >90)
GFR calc non Af Amer: 12 mL/min — ABNORMAL LOW (ref >90)
GFR calc non Af Amer: 12 mL/min — ABNORMAL LOW (ref >90)
Glucose, Bld: 150 mg/dL — ABNORMAL HIGH (ref 70–99)
Potassium: 4.1 mEq/L (ref 3.5–5.1)
Sodium: 138 mEq/L (ref 135–145)
Sodium: 138 mEq/L (ref 135–145)

## 2013-01-26 LAB — GLUCOSE, CAPILLARY
Glucose-Capillary: 175 mg/dL — ABNORMAL HIGH (ref 70–99)
Glucose-Capillary: 143 mg/dL — ABNORMAL HIGH (ref 70–99)

## 2013-01-26 LAB — CBC
MCH: 28.5 pg (ref 26.0–34.0)
MCHC: 30.8 g/dL (ref 30.0–36.0)
Platelets: 240 10*3/uL (ref 150–400)
RBC: 2.7 MIL/uL — ABNORMAL LOW (ref 3.87–5.11)

## 2013-01-26 LAB — URINE CULTURE: Colony Count: >=100000

## 2013-01-26 LAB — CLOSTRIDIUM DIFFICILE BY PCR: Toxigenic C. Difficile by PCR: POSITIVE — AB

## 2013-01-26 MED ORDER — METRONIDAZOLE IN NACL 5-0.79 MG/ML-% IV SOLN
500.0000 mg | Freq: Three times a day (TID) | INTRAVENOUS | Status: DC
  Administered 2013-01-26 – 2013-01-27 (×2): 500 mg via INTRAVENOUS
  Filled 2013-01-26 (×4): qty 100

## 2013-01-26 MED ORDER — PIPERACILLIN-TAZOBACTAM 3.375 G IVPB
3.3750 g | Freq: Three times a day (TID) | INTRAVENOUS | Status: DC
  Administered 2013-01-26 – 2013-01-28 (×6): 3.375 g via INTRAVENOUS
  Filled 2013-01-26 (×9): qty 50

## 2013-01-26 NOTE — Progress Notes (Signed)
Subjective: No complaints, no sob or n/v  Objective Vital signs in last 24 hours: Filed Vitals:   01/25/13 1443 01/25/13 2012 01/25/13 2236 01/26/13 0500  BP: 150/55 134/50  100/51  Pulse: 92 101  91  Temp: 98.1 F (36.7 C) 100 F (37.8 C) 102.7 F (39.3 C) 98.2 F (36.8 C)  TempSrc: Oral Oral  Oral  Resp: 24 18  18   Height:      Weight:    123.6 kg (272 lb 7.8 oz)  SpO2: 95% 99%  96%   Weight change: 1.038 kg (2 lb 4.6 oz)  Intake/Output Summary (Last 24 hours) at 01/26/13 1209 Last data filed at 01/26/13 0900  Gross per 24 hour  Intake   2877 ml  Output      0 ml  Net   2877 ml   Labs: Basic Metabolic Panel:  Recent Labs Lab 01/25/13 1052 01/25/13 1544 01/25/13 2311 01/26/13 0859  NA 137 135 135 138  K 5.4* 4.9 4.1 3.9  CL 112 108 107 107  CO2 13* 15* 15* 18*  GLUCOSE 104* 135* 214* 150*  BUN 82* 77* 77* 78*  CREATININE 4.11* 3.95* 3.94* 4.02*  CALCIUM 8.6 8.7 7.9* 8.1*   Liver Function Tests: No results found for this basename: AST, ALT, ALKPHOS, BILITOT, PROT, ALBUMIN,  in the last 168 hours No results found for this basename: LIPASE, AMYLASE,  in the last 168 hours No results found for this basename: AMMONIA,  in the last 168 hours CBC:  Recent Labs Lab 01/24/13 1903 01/24/13 1920 01/25/13 0156 01/26/13 0859  WBC 17.5*  --  17.0* 25.9*  NEUTROABS 14.9*  --   --   --   HGB 8.4* 9.9* 7.8* 7.7*  HCT 27.3* 29.0* 25.1* 25.0*  MCV 94.1  --  94.0 92.6  PLT 279  --  257 240   PT/INR: @LABRCNTIP (inr:5)   Scheduled Meds ) . aspirin EC  81 mg Oral Daily  . calcitRIOL  0.25 mcg Oral Daily  . calcium carbonate  2 tablet Oral Daily  . carvedilol  25 mg Oral BID WC  . cefTRIAXone (ROCEPHIN)  IV  2 g Intravenous Q24H  . DULoxetine  60 mg Oral Daily  . feeding supplement  1 Container Oral TID BM  . ferrous sulfate  325 mg Oral BID  . folic acid  1 mg Oral Daily  . heparin  5,000 Units Subcutaneous Q8H  . insulin aspart  0-9 Units Subcutaneous TID  WC  . isosorbide mononitrate  30 mg Oral Daily  . levothyroxine  25 mcg Oral QAC breakfast  . saccharomyces boulardii  250 mg Oral BID  . simvastatin  20 mg Oral q1800  . sodium bicarbonate  650 mg Oral BID  . sodium chloride  3 mL Intravenous Q12H  . sodium chloride  3 mL Intravenous Q12H  . terbinafine  1 application Topical Daily  . traMADol  50 mg Oral Q12H  . zinc sulfate  220 mg Oral Daily    Physical Exam:  Blood pressure 100/51, pulse 91, temperature 98.2 F (36.8 C), temperature source Oral, resp. rate 18, height 5\' 7"  (1.702 m), weight 123.6 kg (272 lb 7.8 oz), last menstrual period 12/24/2012, SpO2 96.00%.  Gen: alert, in no distress  Skin: no rash, cyanosis  HEENT: EOMI, sclera anicteric, throat clear and moist  Neck: flat neck veins, no JVD, no LAN  Chest: clear bilat, no rales or rhonchi  CV: regular, no rub or gallop,  no carotid bruits, pedal pulses intact  Abdomen: soft, marked obesity, no ascites, liver down 4cm, +BS  Ext: no LE edema, no joint effusion or deformity, no gangrene or ulceration  Neuro: alert, Ox3, no focal deficit, no asterixis   Renal US- stent on R side noted, no hydro either side, ^'d echogenicity due to medical renal disease  UA- 100 prot, TNTC wbc and 21-50 rbc, many bact   Impression/Plan  1. Acute on CKD IV- no hydro, vol depletion vs progression of underlying disease. Cr down but not much. Cont IVF"s another 24hrs. No indication for HD.   2. CKD IV baseline creat around 3 3. UTI- rocephin 4. Hidradenitis suppuritiva- on IV vanc currently, per primary 5. Hx syst CHF 6. Hx bilat renal stones- R stent in place, hx L stent last year also. No hydro this time 7. Obesity 8. DM 2 9. HTN   Kelly Splinter  MD [phone] pgr    [phone] cell 01/26/2013, 12:09 PM

## 2013-01-26 NOTE — Plan of Care (Signed)
Note that WBC count is still escalating and had fever overnight. I suspect that the source is her hydradinitis, but on speaking w/ the husband, she does not currently wish to have surgery as possible source control for this infection.   I will escalate the gram negative coverage to zosyn, and clearly told the husband that if WBC continues to increase, this infection is likely localized and IV abx are not going to be sufficient and surgery is the ultimate treatment. May need to readdress this is the coming day.    Velna Hatchet, MD  [facility]

## 2013-01-26 NOTE — Progress Notes (Signed)
1907 placed a  email message to Dr. Chyrl Civatte Re; c diff positive Pt been on isolation today

## 2013-01-26 NOTE — Progress Notes (Addendum)
[facility] PROGRESS NOTE Scott L. Ardeth Perfect, MD               Pager [phone] (if 7P to 7A, page night hospitalist on [email or web address] Garnet IU:[id number] DOB: 06-10-1961 DOA: 01/24/2013 PCP: [name], MD  Assessment/Plan: 1. Acute on chronic renal failure with hyperkalemia -   - Cr holding stable overnight.   - hyperkalemia has resolved at this time. q8hr BMPs  - nephro is following patient. Appreciate their recs   - Acidosis is stable, monitoring w/ q8hr BMP. On bicarb ggt at 75 cc/hr  - Holding Lasix 80mg  po daily, can given prn basis.  - Renal U/S r/o'd obstruction.   - BUN/Cr ratio is elevated indicating likely dehydration, being hydrated at 75cc/hr w/ bicarb ggt in D50.    - strict I/Os and daily weights.  - BMP q8hrs to trend bicarb, K, etc.   - Patient is to be on renal diet and dietitian consulted.   - UTI is being treated appropriately w/ rocephin IV  2. Metabolic acidosis from renal failure - closely follow metabolic panel.  ABG showed pH of 7.152 w/ HCO3 of 12 overnight. Cont on bicarb drip at 75 cc/hr. nephro following . BMP q8 hrs. 3. SIRS w/ skin and urine source -   - UTI is being treated w/ rocephin   - skin infection being treated w/ vanc per pharmacy protocol   - hydrating w/ HCO3 ggt   - blood cultures NGTD . Wound culture pending. Urine culture pending.   Diabetes mellitus type 2 - patient is placed on sliding-scale coverage.Adequate control overnight.   Hypothyroidism - continue Synthroid. Check TSH.  History of systolic CHF - last EF measured in December last was showing EF of 60-65%. Holding lasix 80mg  daily during AKI.    Principal Problem:   Renal failure (ARF), acute on chronic Active Problems:   Hidradenitis suppurativa   Metabolic acidosis   Leucocytosis   Code Status: Full Code  Disposition Plan: anticipate 5-7 days   Velna Hatchet, MD  Internal Medicine Pager [phone].  If 7PM-7AM, please contact night-coverage at  [email or web address], password [facility] 01/26/2013, 10:04 AM   LOS: 2 days   Brief narrative: Pt admitted for hyperkalemia and acute on chronic renal failure. Also noted to have leukocytosis. Of note, had recent admit for sepsis and AKI 2/2 PNA and UTI.   Consultants:  Nephrology 01/25/13  Procedures:  None   Antibiotics:  Doxycycline 2/28 >> 01/26/12  Ceftriaxone 3/1 >>  Vanc IV 3/1 >>  ?  HPI/Subjective: No complaints this AM. Laying flat w/o dyspnea.   Objective: Filed Vitals:   01/25/13 1443 01/25/13 2012 01/25/13 2236 01/26/13 0500  BP: 150/55 134/50  100/51  Pulse: 92 101  91  Temp: 98.1 F (36.7 C) 100 F (37.8 C) 102.7 F (39.3 C) 98.2 F (36.8 C)  TempSrc: Oral Oral  Oral  Resp: 24 18  18   Height:      Weight:    123.6 kg (272 lb 7.8 oz)  SpO2: 95% 99%  96%    Intake/Output Summary (Last 24 hours) at 01/26/13 1004 Last data filed at 01/26/13 0900  Gross per 24 hour  Intake   3177 ml  Output    200 ml  Net   2977 ml    Exam:  General: Well-developed well-nourished.  Eyes: Anicteric no pallor.  ENT: No discharge from ears eyes nose and mouth.  Neck: No mass felt.  Cardiovascular: S1-S2 heard.  Respiratory: No rhonchi no crepitations.  Abdomen: Soft nontender. In the groin area chronic skin changes are seen. I don't see any active discharge at this time but there is a dressing with mild soiling.  Skin: See abdominal findings.  Musculoskeletal: Nontender.  Psychiatric: Appears normal.  Neurologic: Moves all extremities.  Data Reviewed: Basic Metabolic Panel:  Recent Labs Lab 01/24/13 2000 01/25/13 0156 01/25/13 1052 01/25/13 1544 01/25/13 2311  NA 137 136 137 135 135  K 6.8* 6.4* 5.4* 4.9 4.1  CL 112 111 112 108 107  CO2 12* 13* 13* 15* 15*  GLUCOSE 150* 139* 104* 135* 214*  BUN 86* 87* 82* 77* 77*  CREATININE 4.32* 4.37* 4.11* 3.95* 3.94*  CALCIUM 9.0 8.7 8.6 8.7 7.9*  MG  --  2.3  --   --   --    Liver Function Tests: No results found for  this basename: AST, ALT, ALKPHOS, BILITOT, PROT, ALBUMIN,  in the last 168 hours No results found for this basename: LIPASE, AMYLASE,  in the last 168 hours No results found for this basename: AMMONIA,  in the last 168 hours CBC:  Recent Labs Lab 01/24/13 1903 01/24/13 1920 01/25/13 0156 01/26/13 0859  WBC 17.5*  --  17.0* 25.9*  NEUTROABS 14.9*  --   --   --   HGB 8.4* 9.9* 7.8* 7.7*  HCT 27.3* 29.0* 25.1* 25.0*  MCV 94.1  --  94.0 92.6  PLT 279  --  257 240   Cardiac Enzymes: No results found for this basename: CKTOTAL, CKMB, CKMBINDEX, TROPONINI,  in the last 168 hours BNP: No components found with this basename: POCBNP,  CBG:  Recent Labs Lab 01/25/13 0650 01/25/13 1127 01/25/13 1719 01/25/13 2129 01/26/13 0636  GLUCAP 98 96 111* 142* 148*    Recent Results (from the past 240 hour(s))  WOUND CULTURE     Status: None   Collection Time    01/25/13  7:39 AM      Result Value Range Status   Specimen Description WOUND BUTTOCKS   Final   Special Requests NONE   Final   Gram Stain PENDING   Incomplete   Culture Culture reincubated for better growth   Final   Report Status PENDING   Incomplete  WOUND CULTURE     Status: None   Collection Time    01/25/13  8:52 AM      Result Value Range Status   Specimen Description WOUND LEFT GROIN   Final   Special Requests NONE   Final   Gram Stain PENDING   Incomplete   Culture Culture reincubated for better growth   Final   Report Status PENDING   Incomplete     Studies: US Renal  01/24/2013  *RADIOLOGY REPORT*  Clinical Data: Abnormal labs  RENAL/URINARY TRACT ULTRASOUND COMPLETE  Comparison:  12/31/2012  Findings:  Right Kidney:  Increased echogenicity. Portions of the lower pole are obscured by bowel gas artifact.  Allowing for this, no focal abnormality.  No hydronephrosis.  Measures 11.7 cm.  Left Kidney:  Increased echogenicity.  No focal abnormality.  No hydronephrosis.  Measures 10.1 cm. Nephro ureteral stent noted.   Bladder:  Within normal limits. The ureteral stent extends into the bladder.  IMPRESSION: Echogenic kidneys suggesting underlying medical renal disease.  No hydronephrosis.  Left Nephro ureteral stent.   Original Report Authenticated By: Carlos Levering, M.D.    US Renal  12/31/2012  *RADIOLOGY REPORT*  Clinical Data: Chronic kidney disease.  Rule out hydronephrosis.  RENAL/URINARY TRACT ULTRASOUND COMPLETE  Comparison:  CT of 11/12/2012.  Renal ultrasound 11/11/2012.  Findings:  Right Kidney:  11.8 cm. No hydronephrosis.  Increased echogenicity.  Left Kidney:  12.3 cm. No hydronephrosis.  Increased echogenicity.  Bladder:  Within normal limits.  Incidental note is made of small volume perihepatic ascites.  IMPRESSION:  1. No hydronephrosis. 2.  Increased echogenicity of the kidneys, suggesting medical renal disease. 3.  Small volume ascites.   Original Report Authenticated By: Abigail Miyamoto, M.D.     Scheduled Meds: . aspirin EC  81 mg Oral Daily  . calcitRIOL  0.25 mcg Oral Daily  . calcium carbonate  2 tablet Oral Daily  . carvedilol  25 mg Oral BID WC  . cefTRIAXone (ROCEPHIN)  IV  2 g Intravenous Q24H  . DULoxetine  60 mg Oral Daily  . feeding supplement  1 Container Oral TID BM  . ferrous sulfate  325 mg Oral BID  . folic acid  1 mg Oral Daily  . heparin  5,000 Units Subcutaneous Q8H  . insulin aspart  0-9 Units Subcutaneous TID WC  . isosorbide mononitrate  30 mg Oral Daily  . levothyroxine  25 mcg Oral QAC breakfast  . saccharomyces boulardii  250 mg Oral BID  . simvastatin  20 mg Oral q1800  . sodium bicarbonate  650 mg Oral BID  . sodium chloride  3 mL Intravenous Q12H  . sodium chloride  3 mL Intravenous Q12H  . terbinafine  1 application Topical Daily  . traMADol  50 mg Oral Q12H  . zinc sulfate  220 mg Oral Daily   Continuous Infusions: .  sodium bicarbonate  infusion 1000 mL 75 mL/hr at 01/26/13 0005   Bicarb ggt at 75cc/hr    Time Spent: 45 minutes

## 2013-01-26 NOTE — Progress Notes (Signed)
ANTIBIOTIC CONSULT NOTE - F/U  Pharmacy Consult for Vancomycin + Zosyn Indication: hidradenitis suppurativa  Allergies  Allergen Reactions  . Amoxicillin-Pot Clavulanate Diarrhea  . Rosiglitazone Maleate     REACTION: swelling  . Amoxicillin Rash    Patient Measurements: Height: 5\' 7"  (170.2 cm) Weight: 272 lb 7.8 oz (123.6 kg) (pt did not want to stand) IBW/kg (Calculated) : 61.6  Vital Signs: Temp: 98.2 F (36.8 C) (03/02 0500) Temp src: Oral (03/02 0500) BP: 100/51 mmHg (03/02 0500) Pulse Rate: 91 (03/02 0500) Intake/Output from previous day: 03/01 0701 - 03/02 0700 In: [place] [P.O.:1557; I.V.:1500] Out: 200 [Urine:200] Intake/Output from this shift: Total I/O In: 120 [P.O.:120] Out: -   Labs:  Recent Labs  01/24/13 1903 01/24/13 1920  01/25/13 0156  01/25/13 1334 01/25/13 1544 01/25/13 2311 01/26/13 0859  WBC 17.5*  --   --  17.0*  --   --   --   --  25.9*  HGB 8.4* 9.9*  --  7.8*  --   --   --   --  7.7*  PLT 279  --   --  257  --   --   --   --  240  LABCREA  --   --   --   --   --  50.89  --   --   --   CREATININE  --  4.80*  < > 4.37*  < >  --  3.95* 3.94* 4.02*  < > = values in this interval not displayed. Estimated Creatinine Clearance: 22.6 ml/min (by C-G formula based on Cr of 4.02). No results found for this basename: VANCOTROUGH, Corlis Leak, VANCORANDOM, [place], GENTPEAK, [place], [place]iefer, TOBRAPEAK, TOBRARND, AMIKACINPEAK, AMIKACINTROU, AMIKACIN,  in the last 72 hours   Microbiology: Recent Results (from the past 720 hour(s))  URINE CULTURE     Status: None   Collection Time    01/25/13 12:10 AM      Result Value Range Status   Specimen Description URINE, RANDOM   Final   Special Requests CX ADDED AT 0034 ON [id number]   Final   Culture  Setup Time 01/25/2013 05:23   Final   Colony Count >=100,000 COLONIES/ML   Final   Culture     Final   Value: Multiple bacterial morphotypes present, none predominant. Suggest appropriate recollection  if clinically indicated.   Report Status 01/26/2013 FINAL   Final  WOUND CULTURE     Status: None   Collection Time    01/25/13  7:39 AM      Result Value Range Status   Specimen Description WOUND BUTTOCKS   Final   Special Requests NONE   Final   Gram Stain PENDING   Incomplete   Culture Culture reincubated for better growth   Final   Report Status PENDING   Incomplete  WOUND CULTURE     Status: None   Collection Time    01/25/13  8:52 AM      Result Value Range Status   Specimen Description WOUND LEFT GROIN   Final   Special Requests NONE   Final   Gram Stain PENDING   Incomplete   Culture Culture reincubated for better growth   Final   Report Status PENDING   Incomplete    Medical History: Past Medical History  Diagnosis Date  . Systolic congestive heart failure   . SOB (shortness of breath)   . HTN (hypertension)   . DM type 2 (diabetes mellitus, type 2)   .  Chronic kidney disease (CKD), stage III (moderate)   . Iron deficiency anemia   . Morbid obesity   . Hyperkalemia   . Hypothyroidism   . CHF (congestive heart failure)    Assessment: Miss Baillargeon is a 22 YOF with a stage 4 ckd and a history of hidradenitis suppurativa. She also has history of septic shock and UTI during a recent admission 2 months ago. She presents on this hospital admission with elevated potassium and leukocytosis and pharmacy was consulted to dose antibiotics. She was on bactrim PTA.   Noted that first line treatment for hidradenitis suppurativa is a tetracycline while the 2nd line is clindamycin. Patient was put on doxycycline from 2/28 to 3/1 and now to change to vancomycin because of multiple cutaneous locations. Patient has been on vancomycin in the past (07/2012) and pharmacy was dosing based on random levels. Will likely have to do such on this admit as well.   Patient is obese (122kg) and Scr is 4.02 (baseline 2.8). Nephrology has been consulted but no current plans for RRT. WBC are rising and  fever overnight. She has been switched from ceftriaxone to zosyn for more broad gram negative coverage  Goal of Therapy:  Vancomycin trough level 10-15 mcg/ml  Plan:  - Obtain random Vanc level 48 hours post last dose (level due 3/3 @ 1800--not ordered yet in case therapy changes) - Adjust dose of vanc based on level - Start Zosyn 3.375g IV q8h  - F/u renal function, HD requirements   Thank you, Vertis Kelch, Pharm.D. Clinical Pharmacist   Pager: [phone] 01/26/2013 1:26 PM

## 2013-01-27 DIAGNOSIS — D649 Anemia, unspecified: Secondary | ICD-10-CM

## 2013-01-27 LAB — WOUND CULTURE

## 2013-01-27 LAB — BASIC METABOLIC PANEL
Calcium: 7.6 mg/dL — ABNORMAL LOW (ref 8.4–10.5)
GFR calc Af Amer: 14 mL/min — ABNORMAL LOW (ref >90)
GFR calc Af Amer: 14 mL/min — ABNORMAL LOW (ref >90)
GFR calc non Af Amer: 12 mL/min — ABNORMAL LOW (ref >90)
GFR calc non Af Amer: 12 mL/min — ABNORMAL LOW (ref >90)
Potassium: 3.7 mEq/L (ref 3.5–5.1)
Potassium: 3.3 mEq/L — ABNORMAL LOW (ref 3.5–5.1)
Sodium: 135 mEq/L (ref 135–145)
Sodium: 136 mEq/L (ref 135–145)

## 2013-01-27 LAB — CBC
MCV: 91.4 fL (ref 78.0–100.0)
Platelets: 214 10*3/uL (ref 150–400)
RBC: 2.33 MIL/uL — ABNORMAL LOW (ref 3.87–5.11)
RDW: 17.2 % — ABNORMAL HIGH (ref 11.5–15.5)
WBC: 21.1 10*3/uL — ABNORMAL HIGH (ref 4.0–10.5)

## 2013-01-27 LAB — PREPARE RBC (CROSSMATCH)

## 2013-01-27 LAB — HEMOGLOBIN AND HEMATOCRIT, BLOOD: HCT: 23.1 % — ABNORMAL LOW (ref 36.0–46.0)

## 2013-01-27 LAB — GLUCOSE, CAPILLARY
Glucose-Capillary: 151 mg/dL — ABNORMAL HIGH (ref 70–99)
Glucose-Capillary: 145 mg/dL — ABNORMAL HIGH (ref 70–99)

## 2013-01-27 MED ORDER — VANCOMYCIN HCL 10 G IV SOLR
1750.0000 mg | INTRAVENOUS | Status: DC
  Administered 2013-01-27: 1750 mg via INTRAVENOUS
  Filled 2013-01-27: qty 1750

## 2013-01-27 MED ORDER — VANCOMYCIN 50 MG/ML ORAL SOLUTION
125.0000 mg | Freq: Four times a day (QID) | ORAL | Status: DC
  Administered 2013-01-27 – 2013-01-29 (×9): 125 mg via ORAL
  Filled 2013-01-27 (×12): qty 2.5

## 2013-01-27 NOTE — Progress Notes (Signed)
ANTIBIOTIC CONSULT NOTE - FOLLOW UP  Pharmacy Consult:  Vancomycin / Zosyn Indication:  Hydradenitis suppurativa   Allergies  Allergen Reactions  . Amoxicillin-Pot Clavulanate Diarrhea  . Rosiglitazone Maleate     REACTION: swelling  . Amoxicillin Rash    Patient Measurements: Height: 5\' 7"  (170.2 cm) Weight: 278 lb 1.6 oz (126.145 kg) ( c scale/ reported to RN) IBW/kg (Calculated) : 61.6  Vital Signs: Temp: 97.9 F (36.6 C) (03/03 1316) Temp src: Oral (03/03 1316) BP: 107/65 mmHg (03/03 1316) Pulse Rate: 75 (03/03 1316) Intake/Output from previous day: 03/02 0701 - 03/03 0700 In: 597 [P.O.:597] Out: -  Intake/Output from this shift: Total I/O In: 822.5 [P.O.:480; Blood:342.5] Out: -   Labs:  Recent Labs  01/25/13 0156  01/25/13 1334  01/26/13 0859 01/26/13 1511 01/27/13 0103 01/27/13 0752  WBC 17.0*  --   --   --  25.9*  --   --  21.1*  HGB 7.8*  --   --   --  7.7*  --   --  6.7*  PLT 257  --   --   --  240  --   --  214  LABCREA  --   --  50.89  --   --   --   --   --   CREATININE 4.37*  < >  --   < > 4.02* 4.06* 4.09* 4.02*  < > = values in this interval not displayed. Estimated Creatinine Clearance: 22.8 ml/min (by C-G formula based on Cr of 4.02). No results found for this basename: VANCOTROUGH, Corlis Leak, VANCORANDOM, [place], GENTPEAK, [place], [place], TOBRAPEAK, TOBRARND, AMIKACINPEAK, AMIKACINTROU, AMIKACIN,  in the last 72 hours   Microbiology: Recent Results (from the past 720 hour(s))  URINE CULTURE     Status: None   Collection Time    01/25/13 12:10 AM      Result Value Range Status   Specimen Description URINE, RANDOM   Final   Special Requests CX ADDED AT 0034 ON [id number]   Final   Culture  Setup Time 01/25/2013 05:23   Final   Colony Count >=100,000 COLONIES/ML   Final   Culture     Final   Value: Multiple bacterial morphotypes present, none predominant. Suggest appropriate recollection if clinically indicated.   Report Status  01/26/2013 FINAL   Final  WOUND CULTURE     Status: None   Collection Time    01/25/13  7:39 AM      Result Value Range Status   Specimen Description WOUND BUTTOCKS   Final   Special Requests NONE   Final   Gram Stain     Final   Value: NO WBC SEEN     RARE SQUAMOUS EPITHELIAL CELLS PRESENT     RARE GRAM POSITIVE COCCI IN PAIRS   Culture     Final   Value: MULTIPLE ORGANISMS PRESENT, NONE PREDOMINANT     Note: NO STAPHYLOCOCCUS AUREUS ISOLATED NO GROUP A STREP (S.PYOGENES) ISOLATED   Report Status 01/27/2013 FINAL   Final  WOUND CULTURE     Status: None   Collection Time    01/25/13  8:52 AM      Result Value Range Status   Specimen Description WOUND LEFT GROIN   Final   Special Requests NONE   Final   Gram Stain PENDING   Incomplete   Culture     Final   Value: MULTIPLE ORGANISMS PRESENT, NONE PREDOMINANT     Note: NO STAPHYLOCOCCUS  AUREUS ISOLATED NO GROUP A STREP (S.PYOGENES) ISOLATED   Report Status PENDING   Incomplete  CULTURE, BLOOD (ROUTINE X 2)     Status: None   Collection Time    01/25/13 11:36 PM      Result Value Range Status   Specimen Description BLOOD LEFT HAND   Final   Special Requests BOTTLES DRAWN AEROBIC ONLY 10CC   Final   Culture  Setup Time 01/26/2013 06:24   Final   Culture     Final   Value:        BLOOD CULTURE RECEIVED NO GROWTH TO DATE CULTURE WILL BE HELD FOR 5 DAYS BEFORE ISSUING A FINAL NEGATIVE REPORT   Report Status PENDING   Incomplete  CULTURE, BLOOD (ROUTINE X 2)     Status: None   Collection Time    01/25/13 11:42 PM      Result Value Range Status   Specimen Description BLOOD LEFT HAND   Final   Special Requests BOTTLES DRAWN AEROBIC ONLY 10CC   Final   Culture  Setup Time 01/26/2013 06:24   Final   Culture     Final   Value:        BLOOD CULTURE RECEIVED NO GROWTH TO DATE CULTURE WILL BE HELD FOR 5 DAYS BEFORE ISSUING A FINAL NEGATIVE REPORT   Report Status PENDING   Incomplete  CLOSTRIDIUM DIFFICILE BY PCR     Status: Abnormal    Collection Time    01/26/13  3:01 PM      Result Value Range Status   C difficile by pcr POSITIVE (*) NEGATIVE Final   Comment: CRITICAL RESULT CALLED TO, READ BACK BY AND VERIFIED WITH:     CIJANIT T,RN 1830 01/26/13 SCALES H        Assessment: 73 YOF to continue on vancomycin and Zosyn for history of hidradenitis suppurativa.  Patient with renal dysfunction but dialysis is not indicated at this time per Renal.  Her SCr has been stable since admission.  Currently on PO vancomycin for C.diff.  Zosyn 3/2 >>   Vanc 3/1 >> Ceftriaxone 3/1 >> 3/2 Flagyl 3/2 >> 3/3 PO Vanc 3/3 >>  3/1 wound buttocks - multiple organism (final) 3/1 left groin wound - multiple organism (preliminary) 3/1 UCx - multiple bacteria (final) 3/1 BCx - NGTD 3/2 C.diff PCR - positive   Goal of Therapy:  Vanc trough ~15 mcg/mL   Plan:  - Schedule vanc 1750mg  IV Q48H - Continue Zosyn 3.375gm IV Q8H, 4 hr infusion - Continue PO vanc for C.diff as ordered - Monitor renal function, clinical course, vanc trough prior to 4th dose, hemoglobin    [name] D. Mina Marble, PharmD, BCPS Pager:  [phone] 01/27/2013, 2:19 PM

## 2013-01-27 NOTE — Progress Notes (Signed)
Utilization Review Completed.   Kimberly Tucker, RN, BSN Nurse Case Manager  [phone]  

## 2013-01-27 NOTE — Progress Notes (Signed)
S:Able to keep fluids down but still vomiting at times.  Denies diarrhea O:BP 102/62  Pulse 67  Temp(Src) 98.2 F (36.8 C) (Oral)  Resp 19  Ht 5\' 7"  (1.702 m)  Wt 126.145 kg (278 lb 1.6 oz)  BMI 43.55 kg/m2  SpO2 20%  LMP 12/24/2012  Intake/Output Summary (Last 24 hours) at 01/27/13 1119 Last data filed at 01/27/13 [id number]  Gross per 24 hour  Intake    717 ml  Output      0 ml  Net    717 ml   Weight change: 2.545 kg (5 lb 9.8 oz) [id number] and alert [facility]:RRR Resp:clear Abd:+ BS NTNT Ext:no eema NEURO:CNI Ox3 no asterixis   . aspirin EC  81 mg Oral Daily  . calcitRIOL  0.25 mcg Oral Daily  . calcium carbonate  2 tablet Oral Daily  . carvedilol  25 mg Oral BID WC  . DULoxetine  60 mg Oral Daily  . feeding supplement  1 Container Oral TID BM  . ferrous sulfate  325 mg Oral BID  . folic acid  1 mg Oral Daily  . heparin  5,000 Units Subcutaneous Q8H  . insulin aspart  0-9 Units Subcutaneous TID WC  . isosorbide mononitrate  30 mg Oral Daily  . levothyroxine  25 mcg Oral QAC breakfast  . metronidazole  500 mg Intravenous Q8H  . piperacillin-tazobactam (ZOSYN)  IV  3.375 g Intravenous Q8H  . saccharomyces boulardii  250 mg Oral BID  . simvastatin  20 mg Oral q1800  . sodium bicarbonate  650 mg Oral BID  . sodium chloride  3 mL Intravenous Q12H  . sodium chloride  3 mL Intravenous Q12H  . terbinafine  1 application Topical Daily  . traMADol  50 mg Oral Q12H  . zinc sulfate  220 mg Oral Daily   No results found. BMET    Component Value Date/Time   NA 136 01/27/2013 0752   K 3.3* 01/27/2013 0752   CL 103 01/27/2013 0752   CO2 20 01/27/2013 0752   GLUCOSE 141* 01/27/2013 0752   BUN 80* 01/27/2013 0752   CREATININE 4.02* 01/27/2013 0752   CREATININE 1.90* 03/07/2012 1652   CALCIUM 7.6* 01/27/2013 0752   GFRNONAA 12* 01/27/2013 0752   GFRAA 14* 01/27/2013 0752   CBC    Component Value Date/Time   WBC 21.1* 01/27/2013 0752   WBC 17.8* 06/01/2008 1547   RBC 2.33* 01/27/2013 0752   RBC  4.50 06/01/2008 1547   HGB 6.7* 01/27/2013 0752   HGB 10.1* 06/01/2008 1547   HCT 21.3* 01/27/2013 0752   HCT 33.0* 06/01/2008 1547   PLT 214 01/27/2013 0752   PLT 452* 06/01/2008 1547   MCV 91.4 01/27/2013 0752   MCV 73.2* 06/01/2008 1547   MCH 28.8 01/27/2013 0752   MCH 22.5* 06/01/2008 1547   MCHC 31.5 01/27/2013 0752   MCHC 30.7* 06/01/2008 1547   RDW 17.2* 01/27/2013 0752   RDW 18.4* 06/01/2008 1547   LYMPHSABS 1.1 01/24/2013 1903   LYMPHSABS 1.1 06/01/2008 1547   MONOABS 0.7 01/24/2013 1903   MONOABS 0.6 06/01/2008 1547   EOSABS 0.8* 01/24/2013 1903   EOSABS 0.5 06/01/2008 1547   BASOSABS 0.0 01/24/2013 1903   BASOSABS 0.0 06/01/2008 1547     Assessment: 1. Acute on CKD 4  2. C Diff colitis 3. Suppuritive Hidradenitis 4. HTN 5. DM 6. Renal stones, Hx Rt ureteral stent.  No hydro on [place] 7. Metabolic Acidosis, improving  Plan: 1.  Daily Scr 2. Decrease IV rate to 50cc/hr 3.    MATTINGLY,MICHAEL T

## 2013-01-27 NOTE — Progress Notes (Signed)
CRITICAL VALUE ALERT  Critical value received:  Hg 6.7  Date of notification: 01-28-12  Time of notification: 0834  Critical value read back:yes  Nurse who received alert:  Nanetta Batty  MD notified (1st page):  [phone] [facility]  Time of first page:  [phone]  MD notified (2nd page):  Time of second page:   Time MD responded:  [phone]

## 2013-01-27 NOTE — Progress Notes (Signed)
Patient ID: [name], female   DOB: 1961-05-01, 52 y.o.   MRN: [id number] [facility] PROGRESS NOTE  Eden Saravia [id number] DOB: October 24, 1961 DOA: 01/24/2013 PCP: [name], MD  Brief narrative: Pt admitted for hyperkalemia and acute on chronic renal failure. Also noted to have leukocytosis. Of note, had recent admit for sepsis and AKI 2/2 PNA and UTI. During this hospitalization found to have C. Diff + by PCR.   Assessment/Plan:  Acute on chronic renal failure with hyperkalemia  - Cr holding stable overnight.  - hyperkalemia has resolved at this time and K is slightly on lower side this AM - nephro is following patient. Appreciate their recs  - Acidosis is stable, monitoring with daily BMP - strict I/Os and daily weights.  - Patient is to be on renal diet and dietitian consulted.  - UTI is being treated with Zosyn day #2 Metabolic acidosis from renal failure  - now resolved - will continue bicarb drip at 50 cc per hour - BMP in AM SIRS w/ skin and urine source - UTI is being treated as noted above - skin infection being treated w/ vanc per pharmacy protocol  - hydrating w/ HCO3 ggt  - blood cultures NGTD . Wound culture pending. Urine culture sensitivity pending  Diabetes mellitus type 2  - patient is placed on sliding-scale coverage. - Adequate control overnight.  Hypothyroidism  - continue Synthroid. History of systolic CHF  - last EF measured in December last was showing EF of 60-65%.  - Holding lasix 80mg  daily during AKI.  - clinically compensated at this time - monitor daily weights  C. Diff colitis - discontinue Flagyl and change to Vanc PO - supplement electrolytes as indicated  Acute on chronic blood loss anemia - will transfuse 1 unit of PRBC today - CBC in AM  Hidradenitis suppurative  - currently on Vanc IV - I suspect leukocytosis mostly from C. Diff rather than hidradenitis - will ask ID for recommendations Morbid obesity, BMI > 40 -  nutrition consultation   Consultants:  Nephrology 01/25/13 Procedures:  None  Antibiotics:  Doxycycline 2/28 >> 01/26/12  Ceftriaxone 3/1 >> 3/1 Vanc IV 3/1 >>  Zosyn 3/1 >> Vanc PO 3/2 >>  HPI/Subjective: No events overnight.   Objective: Filed Vitals:   01/27/13 1015 01/27/13 1257 01/27/13 1316 01/27/13 1625  BP: 102/62 112/65 107/65 128/72  Pulse: 67 75 75 76  Temp:  97.9 F (36.6 C) 97.9 F (36.6 C) 98.1 F (36.7 C)  TempSrc:  Oral Oral Oral  Resp: 19 19 19 18   Height:      Weight:      SpO2: 20%  18%     Intake/Output Summary (Last 24 hours) at 01/27/13 1927 Last data filed at 01/27/13 1855  Gross per 24 hour  Intake 4306.58 ml  Output    200 ml  Net 4106.58 ml    Exam:   General:  Pt is alert, follows commands appropriately, not in acute distress, morbid obesity   Cardiovascular: Regular rate and rhythm, S1/S2, no murmurs, no rubs, no gallops  Respiratory: Clear to auscultation bilaterally, no wheezing  Abdomen: Soft, non tender, non distended, bowel sounds present, no guarding  Extremities: No edema, pulses DP and PT palpable bilaterally  Neuro: Grossly nonfocal  Data Reviewed: Basic Metabolic Panel:  Recent Labs Lab 01/25/13 0156  01/25/13 2311 01/26/13 0859 01/26/13 1511 01/27/13 0103 01/27/13 0752  NA 136  < > 135 138 138 135 136  K 6.4*  < >  4.1 3.9 3.8 3.7 3.3*  CL 111  < > 107 107 106 104 103  CO2 13*  < > 15* 18* 19 18* 20  GLUCOSE 139*  < > 214* 150* 119* 224* 141*  BUN 87*  < > 77* 78* 79* 81* 80*  CREATININE 4.37*  < > 3.94* 4.02* 4.06* 4.09* 4.02*  CALCIUM 8.7  < > 7.9* 8.1* 7.9* 7.2* 7.6*  MG 2.3  --   --   --   --   --   --   < > = values in this interval not displayed.  CBC:  Recent Labs Lab 01/24/13 1903 01/24/13 1920 01/25/13 0156 01/26/13 0859 01/27/13 0752 01/27/13 1900  WBC 17.5*  --  17.0* 25.9* 21.1*  --   NEUTROABS 14.9*  --   --   --   --   --   HGB 8.4* 9.9* 7.8* 7.7* 6.7* 7.3*  HCT 27.3* 29.0* 25.1*  25.0* 21.3* 23.1*  MCV 94.1  --  94.0 92.6 91.4  --   PLT 279  --  257 240 214  --    CBG:  Recent Labs Lab 01/26/13 1645 01/26/13 2108 01/27/13 0625 01/27/13 1122 01/27/13 1636  GLUCAP 168* 143* 151* 145* 147*    Recent Results (from the past 240 hour(s))  URINE CULTURE     Status: None   Collection Time    01/25/13 12:10 AM      Result Value Range Status   Specimen Description URINE, RANDOM   Final   Special Requests CX ADDED AT 0034 ON [id number]   Final   Culture  Setup Time 01/25/2013 05:23   Final   Colony Count >=100,000 COLONIES/ML   Final   Culture     Final   Value: Multiple bacterial morphotypes present, none predominant. Suggest appropriate recollection if clinically indicated.   Report Status 01/26/2013 FINAL   Final  WOUND CULTURE     Status: None   Collection Time    01/25/13  7:39 AM      Result Value Range Status   Specimen Description WOUND BUTTOCKS   Final   Special Requests NONE   Final   Gram Stain     Final   Value: NO WBC SEEN     RARE SQUAMOUS EPITHELIAL CELLS PRESENT     RARE GRAM POSITIVE COCCI IN PAIRS   Culture     Final   Value: MULTIPLE ORGANISMS PRESENT, NONE PREDOMINANT     Note: NO STAPHYLOCOCCUS AUREUS ISOLATED NO GROUP A STREP (S.PYOGENES) ISOLATED   Report Status 01/27/2013 FINAL   Final  WOUND CULTURE     Status: None   Collection Time    01/25/13  8:52 AM      Result Value Range Status   Specimen Description WOUND LEFT GROIN   Final   Special Requests NONE   Final   Gram Stain     Final   Value: NO WBC SEEN     NO SQUAMOUS EPITHELIAL CELLS SEEN     RARE GRAM NEGATIVE RODS   Culture     Final   Value: MULTIPLE ORGANISMS PRESENT, NONE PREDOMINANT     Note: NO STAPHYLOCOCCUS AUREUS ISOLATED NO GROUP A STREP (S.PYOGENES) ISOLATED   Report Status 01/27/2013 FINAL   Final  CULTURE, BLOOD (ROUTINE X 2)     Status: None   Collection Time    01/25/13 11:36 PM      Result Value Range Status   Specimen  Description BLOOD LEFT HAND    Final   Special Requests BOTTLES DRAWN AEROBIC ONLY 10CC   Final   Culture  Setup Time 01/26/2013 06:24   Final   Culture     Final   Value:        BLOOD CULTURE RECEIVED NO GROWTH TO DATE CULTURE WILL BE HELD FOR 5 DAYS BEFORE ISSUING A FINAL NEGATIVE REPORT   Report Status PENDING   Incomplete  CULTURE, BLOOD (ROUTINE X 2)     Status: None   Collection Time    01/25/13 11:42 PM      Result Value Range Status   Specimen Description BLOOD LEFT HAND   Final   Special Requests BOTTLES DRAWN AEROBIC ONLY 10CC   Final   Culture  Setup Time 01/26/2013 06:24   Final   Culture     Final   Value:        BLOOD CULTURE RECEIVED NO GROWTH TO DATE CULTURE WILL BE HELD FOR 5 DAYS BEFORE ISSUING A FINAL NEGATIVE REPORT   Report Status PENDING   Incomplete  CLOSTRIDIUM DIFFICILE BY PCR     Status: Abnormal   Collection Time    01/26/13  3:01 PM      Result Value Range Status   C difficile by pcr POSITIVE (*) NEGATIVE Final   Comment: CRITICAL RESULT CALLED TO, READ BACK BY AND VERIFIED WITH:     CIJANIT T,RN 1830 01/26/13 SCALES H     Scheduled Meds: . aspirin EC  81 mg Oral Daily  . calcitRIOL  0.25 mcg Oral Daily  . calcium carbonate  2 tablet Oral Daily  . carvedilol  25 mg Oral BID WC  . DULoxetine  60 mg Oral Daily  . ferrous sulfate  325 mg Oral BID  . folic acid  1 mg Oral Daily  . heparin  5,000 Units Subcutaneous Q8H  . insulin aspart  0-9 Units Subcutaneous TID WC  . isosorbide mononitrate  30 mg Oral Daily  . levothyroxine  25 mcg Oral QAC breakfast  . ZOSYN  IV  3.375 g Intravenous Q8H  . saccharomyces boulardii  250 mg Oral BID  . simvastatin  20 mg Oral q1800  . sodium bicarbonate  650 mg Oral BID  . terbinafine  1 application Topical Daily  . traMADol  50 mg Oral Q12H  . vancomycin  1,750 mg Intravenous Q48H  . vancomycin  125 mg Oral QID  . zinc sulfate  220 mg Oral Daily   Continuous Infusions: .  sodium bicarbonate  infusion 1000 mL 50 mL/hr at 01/27/13 1633      Faye Ramsay, MD  [place] Pager [phone]  If 7PM-7AM, please contact night-coverage [email or web address] Password [email or web address] 01/27/2013, 7:27 PM   LOS: 3 days

## 2013-01-28 DIAGNOSIS — L732 Hidradenitis suppurativa: Secondary | ICD-10-CM

## 2013-01-28 LAB — CBC
HCT: 23.2 % — ABNORMAL LOW (ref 36.0–46.0)
Hemoglobin: 7.5 g/dL — ABNORMAL LOW (ref 12.0–15.0)
MCH: 28.7 pg (ref 26.0–34.0)
MCHC: 32.3 g/dL (ref 30.0–36.0)
MCV: 88.9 fL (ref 78.0–100.0)
Platelets: 222 10*3/uL (ref 150–400)
RBC: 2.61 MIL/uL — ABNORMAL LOW (ref 3.87–5.11)
RDW: 17.6 % — ABNORMAL HIGH (ref 11.5–15.5)
WBC: 19.2 10*3/uL — ABNORMAL HIGH (ref 4.0–10.5)

## 2013-01-28 LAB — RENAL FUNCTION PANEL
Albumin: 2 g/dL — ABNORMAL LOW (ref 3.5–5.2)
BUN: 72 mg/dL — ABNORMAL HIGH (ref 6–23)
CO2: 23 mEq/L (ref 19–32)
Calcium: 7.5 mg/dL — ABNORMAL LOW (ref 8.4–10.5)
Chloride: 103 mEq/L (ref 96–112)
Creatinine, Ser: 3.58 mg/dL — ABNORMAL HIGH (ref 0.50–1.10)
GFR calc Af Amer: 16 mL/min — ABNORMAL LOW (ref >90)
GFR calc non Af Amer: 14 mL/min — ABNORMAL LOW (ref >90)
Glucose, Bld: 104 mg/dL — ABNORMAL HIGH (ref 70–99)
Phosphorus: 3.8 mg/dL (ref 2.3–4.6)
Potassium: 3.3 mEq/L — ABNORMAL LOW (ref 3.5–5.1)
Sodium: 140 mEq/L (ref 135–145)

## 2013-01-28 LAB — TYPE AND SCREEN
ABO/RH(D): B POS
Antibody Screen: NEGATIVE
Unit division: 0

## 2013-01-28 LAB — GLUCOSE, CAPILLARY
Glucose-Capillary: 104 mg/dL — ABNORMAL HIGH (ref 70–99)
Glucose-Capillary: 129 mg/dL — ABNORMAL HIGH (ref 70–99)
Glucose-Capillary: 112 mg/dL — ABNORMAL HIGH (ref 70–99)

## 2013-01-28 MED ORDER — POTASSIUM CHLORIDE CRYS ER 20 MEQ PO TBCR
40.0000 meq | EXTENDED_RELEASE_TABLET | Freq: Once | ORAL | Status: AC
  Administered 2013-01-28: 40 meq via ORAL
  Filled 2013-01-28: qty 2

## 2013-01-28 NOTE — Consult Note (Signed)
Patient interviewed and examined, agree with PA note above. Carefully examined all the areas of chronic hidradenitis around her pannus and groin and thighs and buttocks. There may be a couple of small areas on the right side of her pannus and on the medial left thigh that would benefit from local I&D at the bedside but these are really quite minimal. For the most part wound care and antibiotics as outlined above would be most effective. For now the patient has declined I&D. We will follow.  Edward Jolly MD, FACS  01/28/2013 6:47 PM

## 2013-01-28 NOTE — Progress Notes (Signed)
S:Feeling better   Denies diarrhea O:BP 113/68  Pulse 69  Temp(Src) 98 F (36.7 C) (Oral)  Resp 18  Ht 5\' 7"  (1.702 m)  Wt 127.053 kg (280 lb 1.6 oz)  BMI 43.86 kg/m2  SpO2 96%  LMP 12/24/2012  Intake/Output Summary (Last 24 hours) at 01/28/13 1239 Last data filed at 01/28/13 1143  Gross per 24 hour  Intake 4019.58 ml  Output    450 ml  Net 3569.58 ml   Weight change: 0.907 kg (2 lb) [id number] and alert [facility]:RRR Resp:clear Abd:+ BS NTNT Ext:no edema NEURO:CNI Ox3 no asterixis   . aspirin EC  81 mg Oral Daily  . calcitRIOL  0.25 mcg Oral Daily  . calcium carbonate  2 tablet Oral Daily  . carvedilol  25 mg Oral BID WC  . DULoxetine  60 mg Oral Daily  . feeding supplement  1 Container Oral TID BM  . ferrous sulfate  325 mg Oral BID  . folic acid  1 mg Oral Daily  . heparin  5,000 Units Subcutaneous Q8H  . insulin aspart  0-9 Units Subcutaneous TID WC  . isosorbide mononitrate  30 mg Oral Daily  . levothyroxine  25 mcg Oral QAC breakfast  . potassium chloride  40 mEq Oral Once  . saccharomyces boulardii  250 mg Oral BID  . simvastatin  20 mg Oral q1800  . sodium bicarbonate  650 mg Oral BID  . sodium chloride  3 mL Intravenous Q12H  . sodium chloride  3 mL Intravenous Q12H  . terbinafine  1 application Topical Daily  . traMADol  50 mg Oral Q12H  . vancomycin  125 mg Oral QID  . zinc sulfate  220 mg Oral Daily   No results found. BMET    Component Value Date/Time   NA 140 01/28/2013 0500   K 3.3* 01/28/2013 0500   CL 103 01/28/2013 0500   CO2 23 01/28/2013 0500   GLUCOSE 104* 01/28/2013 0500   BUN 72* 01/28/2013 0500   CREATININE 3.58* 01/28/2013 0500   CREATININE 1.90* 03/07/2012 1652   CALCIUM 7.5* 01/28/2013 0500   GFRNONAA 14* 01/28/2013 0500   GFRAA 16* 01/28/2013 0500   CBC    Component Value Date/Time   WBC 19.2* 01/28/2013 0705   WBC 17.8* 06/01/2008 1547   RBC 2.61* 01/28/2013 0705   RBC 4.50 06/01/2008 1547   HGB 7.5* 01/28/2013 0705   HGB 10.1* 06/01/2008 1547   HCT  23.2* 01/28/2013 0705   HCT 33.0* 06/01/2008 1547   PLT 222 01/28/2013 0705   PLT 452* 06/01/2008 1547   MCV 88.9 01/28/2013 0705   MCV 73.2* 06/01/2008 1547   MCH 28.7 01/28/2013 0705   MCH 22.5* 06/01/2008 1547   MCHC 32.3 01/28/2013 0705   MCHC 30.7* 06/01/2008 1547   RDW 17.6* 01/28/2013 0705   RDW 18.4* 06/01/2008 1547   LYMPHSABS 1.1 01/24/2013 1903   LYMPHSABS 1.1 06/01/2008 1547   MONOABS 0.7 01/24/2013 1903   MONOABS 0.6 06/01/2008 1547   EOSABS 0.8* 01/24/2013 1903   EOSABS 0.5 06/01/2008 1547   BASOSABS 0.0 01/24/2013 1903   BASOSABS 0.0 06/01/2008 1547     Assessment: 1. Acute on CKD 4, renal fx improving 2. C Diff colitis 3. Suppuritive Hidradenitis 4. HTN 5. DM 6. Renal stones, Hx Rt ureteral stent.  No hydro on [place] 7. Metabolic Acidosis, improving 8. anemia  Plan: 1. DC IV fluids 2. Check iron studies 3. Re check Scr in AM  [name] T

## 2013-01-28 NOTE — Progress Notes (Signed)
Patient ID: [name], female   DOB: Apr 08, 1961, 52 y.o.   MRN: [id number]  [facility] PROGRESS NOTE  [name] [id number] DOB: [birth year] DOA: 01/24/2013 PCP: [name], MD  Brief narrative:  Pt admitted for hyperkalemia and acute on chronic renal failure. Also noted to have leukocytosis. Of note, had recent admit for sepsis and AKI 2/2 PNA and UTI. During this hospitalization found to have C. Diff + by PCR.   Assessment/Plan:  Acute on chronic renal failure with hyperkalemia  - Cr trending down slightly - hyperkalemia has resolved at this time and K is slightly on lower side this AM  - nephro is following patient. Appreciate their recs  - Acidosis is stable, monitoring with daily BMP  - strict I/Os and daily weights.  - Patient is to be on renal diet and dietitian consulted.  - UTI is being treated with Zosyn day #3, discussed with ID, unremarkable UA, will plan on discontinuing Zosyn today Metabolic acidosis from renal failure  - now resolved  - bicarb drip at 50 cc per hour  - BMP in AM  SIRS w/ skin and urine source  - UTI treated as noted above, will discontinue Zosyn today - skin infection being treated w/ vancomycin  - hydrating w/ HCO3 ggt  - blood cultures NGTD . Wound culture with multiple organisms present, discussed with ID - recommendation was to discontinue Vancomycin as well Diabetes mellitus type 2  - patient is placed on sliding-scale coverage.  - Adequate control overnight.  Hypothyroidism  - continue Synthroid.  History of systolic CHF  - last EF measured in December last was showing EF of 60-65%.  - Holding lasix 80mg  daily during AKI.  - clinically compensated at this time but 2 lbs weight gain noted - monitor daily weights  C. Diff colitis  - continue Vanc PO  - supplement electrolytes as indicated  Acute on chronic blood loss anemia  - 1 unit of PRBC transfused 01/27/2013 - CBC in AM  Hidradenitis suppurative  - currently on Vanc  IV and as noted above I have discussed with ID and recommendation was to discontinue Vanc - I suspect leukocytosis mostly from C. Diff rather than hidradenitis  - surgery consultation obtained  Morbid obesity, BMI > 40  - nutrition consultation   Consultants:  Nephrology 01/25/13 Surgery ID - discussed with Dr. Linus Salmons over the phone Procedures:  None  Antibiotics:  Doxycycline 2/28 >> 01/26/12  Ceftriaxone 3/1 >> 3/1  Vanc IV 3/1 >> 3/4 Zosyn 3/1 >> 3/4 Vanc PO 3/2 >>   HPI/Subjective: No events overnight.   Objective: Filed Vitals:   01/27/13 1625 01/27/13 2118 01/28/13 0636 01/28/13 1007  BP: 128/72 124/64 124/73 113/68  Pulse: 76 76 72 69  Temp: 98.1 F (36.7 C) 98.6 F (37 C) 98 F (36.7 C)   TempSrc: Oral Oral Oral   Resp: 18 18 18 18   Height:      Weight:   280 lb 1.6 oz (127.053 kg)   SpO2:  94% 98% 96%    Intake/Output Summary (Last 24 hours) at 01/28/13 1131 Last data filed at 01/28/13 1013  Gross per 24 hour  Intake 4019.58 ml  Output    200 ml  Net 3819.58 ml    Exam:   General:  Pt is alert, follows commands appropriately, not in acute distress  Cardiovascular: Regular rate and rhythm, S1/S2, no murmurs, no rubs, no gallops  Respiratory: Clear to auscultation bilaterally, decreased breath sounds at  bases  Abdomen: Soft, non tender, non distended, bowel sounds present, no guarding  Extremities: No edema, pulses DP and PT palpable bilaterally  Skin: Under her pannus several lesions, under gluteal skin folds, under left breast, several in the groin, in skin fold of the left posterior chest, drainage yellow, sanguinous, purulent  Data Reviewed: Basic Metabolic Panel:  Recent Labs Lab 01/25/13 0156  01/26/13 0859 01/26/13 1511 01/27/13 0103 01/27/13 0752 01/28/13 0500  NA 136  < > 138 138 135 136 140  K 6.4*  < > 3.9 3.8 3.7 3.3* 3.3*  CL 111  < > 107 106 104 103 103  CO2 13*  < > 18* 19 18* 20 23  GLUCOSE 139*  < > 150* 119* 224* 141*  104*  BUN 87*  < > 78* 79* 81* 80* 72*  CREATININE 4.37*  < > 4.02* 4.06* 4.09* 4.02* 3.58*  CALCIUM 8.7  < > 8.1* 7.9* 7.2* 7.6* 7.5*  MG 2.3  --   --   --   --   --   --   PHOS  --   --   --   --   --   --  3.8  < > = values in this interval not displayed. Liver Function Tests:  Recent Labs Lab 01/28/13 0500  ALBUMIN 2.0*   CBC:  Recent Labs Lab 01/24/13 1903  01/25/13 0156 01/26/13 0859 01/27/13 0752 01/27/13 1900 01/28/13 0705  WBC 17.5*  --  17.0* 25.9* 21.1*  --  19.2*  NEUTROABS 14.9*  --   --   --   --   --   --   HGB 8.4*  < > 7.8* 7.7* 6.7* 7.3* 7.5*  HCT 27.3*  < > 25.1* 25.0* 21.3* 23.1* 23.2*  MCV 94.1  --  94.0 92.6 91.4  --  88.9  PLT 279  --  257 240 214  --  222  < > = values in this interval not displayed.  CBG:  Recent Labs Lab 01/27/13 0625 01/27/13 1122 01/27/13 1636 01/27/13 2116 01/28/13 0627  GLUCAP 151* 145* 147* 166* 104*    Recent Results (from the past 240 hour(s))  URINE CULTURE     Status: None   Collection Time    01/25/13 12:10 AM      Result Value Range Status   Specimen Description URINE, RANDOM   Final   Special Requests CX ADDED AT 0034 ON [id number]   Final   Culture  Setup Time 01/25/2013 05:23   Final   Colony Count >=100,000 COLONIES/ML   Final   Culture     Final   Value: Multiple bacterial morphotypes present, none predominant. Suggest appropriate recollection if clinically indicated.   Report Status 01/26/2013 FINAL   Final  WOUND CULTURE     Status: None   Collection Time    01/25/13  7:39 AM      Result Value Range Status   Specimen Description WOUND BUTTOCKS   Final   Special Requests NONE   Final   Gram Stain     Final   Value: NO WBC SEEN     RARE SQUAMOUS EPITHELIAL CELLS PRESENT     RARE GRAM POSITIVE COCCI IN PAIRS   Culture     Final   Value: MULTIPLE ORGANISMS PRESENT, NONE PREDOMINANT     Note: NO STAPHYLOCOCCUS AUREUS ISOLATED NO GROUP A STREP (S.PYOGENES) ISOLATED   Report Status 01/27/2013 FINAL    Final  WOUND CULTURE  Status: None   Collection Time    01/25/13  8:52 AM      Result Value Range Status   Specimen Description WOUND LEFT GROIN   Final   Special Requests NONE   Final   Gram Stain     Final   Value: NO WBC SEEN     NO SQUAMOUS EPITHELIAL CELLS SEEN     RARE GRAM NEGATIVE RODS   Culture     Final   Value: MULTIPLE ORGANISMS PRESENT, NONE PREDOMINANT     Note: NO STAPHYLOCOCCUS AUREUS ISOLATED NO GROUP A STREP (S.PYOGENES) ISOLATED   Report Status 01/27/2013 FINAL   Final  CULTURE, BLOOD (ROUTINE X 2)     Status: None   Collection Time    01/25/13 11:36 PM      Result Value Range Status   Specimen Description BLOOD LEFT HAND   Final   Special Requests BOTTLES DRAWN AEROBIC ONLY 10CC   Final   Culture  Setup Time 01/26/2013 06:24   Final   Culture     Final   Value:        BLOOD CULTURE RECEIVED NO GROWTH TO DATE CULTURE WILL BE HELD FOR 5 DAYS BEFORE ISSUING A FINAL NEGATIVE REPORT   Report Status PENDING   Incomplete  CULTURE, BLOOD (ROUTINE X 2)     Status: None   Collection Time    01/25/13 11:42 PM      Result Value Range Status   Specimen Description BLOOD LEFT HAND   Final   Special Requests BOTTLES DRAWN AEROBIC ONLY 10CC   Final   Culture  Setup Time 01/26/2013 06:24   Final   Culture     Final   Value:        BLOOD CULTURE RECEIVED NO GROWTH TO DATE CULTURE WILL BE HELD FOR 5 DAYS BEFORE ISSUING A FINAL NEGATIVE REPORT   Report Status PENDING   Incomplete  CLOSTRIDIUM DIFFICILE BY PCR     Status: Abnormal   Collection Time    01/26/13  3:01 PM      Result Value Range Status   C difficile by pcr POSITIVE (*) NEGATIVE Final   Comment: CRITICAL RESULT CALLED TO, READ BACK BY AND VERIFIED WITH:     CIJANIT T,RN 1830 01/26/13 SCALES H     Scheduled Meds: . aspirin EC  81 mg Oral Daily  . calcitRIOL  0.25 mcg Oral Daily  . calcium carbonate  2 tablet Oral Daily  . carvedilol  25 mg Oral BID WC  . DULoxetine  60 mg Oral Daily  . feeding supplement   1 Container Oral TID BM  . ferrous sulfate  325 mg Oral BID  . folic acid  1 mg Oral Daily  . heparin  5,000 Units Subcutaneous Q8H  . insulin aspart  0-9 Units Subcutaneous TID WC  . isosorbide mononitrate  30 mg Oral Daily  . levothyroxine  25 mcg Oral QAC breakfast  . piperacillin-tazobactam (ZOSYN)  IV  3.375 g Intravenous Q8H  . saccharomyces boulardii  250 mg Oral BID  . simvastatin  20 mg Oral q1800  . sodium bicarbonate  650 mg Oral BID  . sodium chloride  3 mL Intravenous Q12H  . sodium chloride  3 mL Intravenous Q12H  . terbinafine  1 application Topical Daily  . traMADol  50 mg Oral Q12H  . vancomycin  1,750 mg Intravenous Q48H  . vancomycin  125 mg Oral QID  . zinc sulfate  220 mg Oral Daily   Continuous Infusions: .  sodium bicarbonate  infusion 1000 mL 50 mL/hr at 01/28/13 1015     Faye Ramsay, MD  [facility] Pager [phone]  If 7PM-7AM, please contact night-coverage [email or web address] Password [email or web address] 01/28/2013, 11:31 AM   LOS: 4 days

## 2013-01-28 NOTE — Consult Note (Signed)
[place] consulted on 01/25/13, see my full note [place] [id number]

## 2013-01-28 NOTE — Consult Note (Signed)
Reason for Consult: Hidrandenitis Referring Physician: Dr. Mart Piggs  [name] is an 52 y.o. female.  HPI: This is a complicated woman with a history of progressive renal insufficiency was referred to the hospital with hyperkalemia and progressive renal insufficiency. 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  We saw her back in September and December of 2013 with the same problem. She was actually admitted for sepsis in September, but did not want surgery at that time. She has had multiple treatments for hydradenitis, she has been seen at [facility] where they recommended a major excision of her areas of hydradenitis with skin grafting. This has also been declined to this point, because no guarantee could be made in that this would not reoccur even with this extensive surgery. Currently she is has some areas under her pannus, both thighs, across the peritoneum, and the gluteal folds of rectum with hydradenitis and scarring. Areas under the pannus, and some areas in the groin are draining currently. The areas on her buttocks, near the rectum are scarred and hard, but I do not see any areas right now that need drainage.  We were asked to see in consultation, to assist with wound care, patient has not wanted any further surgery as noted above. She was admitted with a potassium of 6.4 and a white count of 17,000, hemoglobin of 7.8, creatinine 4.37.  Past Medical History  Diagnosis Date  . Systolic congestive heart failure   . SOB (shortness of breath)   . HTN (hypertension)   . DM type 2 (diabetes mellitus, type 2)   . Chronic kidney disease (CKD), stage III (moderate) Ureteral stents, bilateral last year  . Iron deficiency anemia   . Morbid obesity/Body mass index is 43.86 kg/(m^2).    Marland Kitchen Hyperkalemia   . Hypothyroidism Chronic pain on narcotics     Past Surgical History  Procedure Laterality Date  . Portacath  placement    . Cystoscopy w/ ureteral stent placement  05/28/2012    Procedure: CYSTOSCOPY WITH RETROGRADE PYELOGRAM/URETERAL STENT PLACEMENT;  Surgeon: Reece Packer, MD;  Location: [facility];  Service: Urology;  Laterality: Left;  . Cystoscopy w/ retrogrades  11/16/2012    Procedure: CYSTOSCOPY WITH RETROGRADE PYELOGRAM;  Surgeon: Reece Packer, MD;  Location: [facility];  Service: Urology;  Laterality: Bilateral;  CYSTOSCOPY,BILATERAL RETROGRADE PYELOGRAM/ REMOVAL LEFT URETERAL STENT/ FULGERATION BLADDER MUCOSA/ INSERTION RIGHT URETERAL STENT  . Port-a-cath removal      Family History  Problem Relation Age of Onset  . Coronary artery disease Mother   . Hypertension Mother   . Diabetes type II Mother   . Malignant hyperthermia Mother   . Coronary artery disease Father   . Hypertension Father   . Malignant hyperthermia Father   . Cancer Maternal Grandfather     ? Type  . Kidney failure Maternal Grandmother     Social History:  reports that she has never smoked. She has never used smokeless tobacco. She reports that she does not drink alcohol or use illicit drugs.  Allergies:  Allergies  Allergen Reactions  . Amoxicillin-Pot Clavulanate Diarrhea  . Rosiglitazone Maleate     REACTION: swelling  . Amoxicillin Rash    Tolerated Zosyn 01/2013.  [name]    Medications:  Prior to Admission:  Prescriptions prior to admission  Medication Sig Dispense Refill  . aspirin EC 81 MG EC tablet Take 1 tablet (81 mg total) by  mouth daily.  30 tablet  3  . calcitRIOL (ROCALTROL) 0.25 MCG capsule Take 1 capsule (0.25 mcg total) by mouth daily.  30 capsule  0  . calcium carbonate (OS-CAL - DOSED IN MG OF ELEMENTAL CALCIUM) 1250 MG tablet Take 2 tablets (1,000 mg of elemental calcium total) by mouth daily.  60 tablet  0  . carvedilol (COREG) 25 MG tablet Take 1 tablet (25 mg total) by mouth 2 (two) times daily with a meal.  60 tablet  0  . cyclobenzaprine (FLEXERIL) 10 MG tablet Take 1 tablet (10  mg total) by mouth 3 (three) times daily as needed for muscle spasms. For Spasms  30 tablet  0  . DULoxetine (CYMBALTA) 60 MG capsule Take 1 capsule (60 mg total) by mouth daily.  30 capsule  0  . feeding supplement (RESOURCE BREEZE) LIQD Take 1 Container by mouth 2 (two) times daily between meals.  30 Container  1  . ferrous sulfate 325 (65 FE) MG tablet Take 1 tablet (325 mg total) by mouth 2 (two) times daily.  60 tablet  0  . folic acid (FOLVITE) 1 MG tablet Take 1 tablet (1 mg total) by mouth daily.  30 tablet  0  . furosemide (LASIX) 80 MG tablet Take 80 mg by mouth daily.      . isosorbide mononitrate (IMDUR) 30 MG 24 hr tablet Take 1 tablet (30 mg total) by mouth daily.  30 tablet  0  . levothyroxine (SYNTHROID, LEVOTHROID) 25 MCG tablet Take 1 tablet (25 mcg total) by mouth every morning. Take on an empty stomach  30 tablet  0  . magnesium oxide (MAG-OX) 400 MG tablet Take 400 mg by mouth 2 (two) times daily.       Marland Kitchen oxyCODONE-acetaminophen (PERCOCET/ROXICET) 5-325 MG per tablet Take 1 tablet by mouth every 6 (six) hours as needed for pain.      . pravastatin (PRAVACHOL) 40 MG tablet Take 1 tablet (40 mg total) by mouth daily.  30 tablet  0  . saccharomyces boulardii (FLORASTOR) 250 MG capsule Take 1 capsule (250 mg total) by mouth 2 (two) times daily.  30 capsule  0  . sodium hypochlorite (DAKIN'S 1/4 STRENGTH) 0.125 % SOLN Irrigate with 1 application as directed daily as needed. Apply to wound with dressing changes      . sulfamethoxazole-trimethoprim (BACTRIM,SEPTRA) 400-80 MG per tablet Take 1 tablet by mouth every 12 (twelve) hours. Start date 12/24/12; Duration unknown      . terbinafine (LAMISIL) 1 % cream Apply 1 application topically daily. Applies under stomach      . traMADol (ULTRAM) 50 MG tablet Take 1 tablet (50 mg total) by mouth every 12 (twelve) hours.  60 tablet  0  . vitamin C (ASCORBIC ACID) 500 MG tablet Take 500 mg by mouth daily.      Marland Kitchen zinc sulfate 220 MG capsule Take  220 mg by mouth daily.       Scheduled: . aspirin EC  81 mg Oral Daily  . calcitRIOL  0.25 mcg Oral Daily  . calcium carbonate  2 tablet Oral Daily  . carvedilol  25 mg Oral BID WC  . DULoxetine  60 mg Oral Daily  . feeding supplement  1 Container Oral TID BM  . ferrous sulfate  325 mg Oral BID  . folic acid  1 mg Oral Daily  . heparin  5,000 Units Subcutaneous Q8H  . insulin aspart  0-9 Units Subcutaneous TID WC  .  isosorbide mononitrate  30 mg Oral Daily  . levothyroxine  25 mcg Oral QAC breakfast  . saccharomyces boulardii  250 mg Oral BID  . simvastatin  20 mg Oral q1800  . sodium bicarbonate  650 mg Oral BID  . sodium chloride  3 mL Intravenous Q12H  . sodium chloride  3 mL Intravenous Q12H  . terbinafine  1 application Topical Daily  . traMADol  50 mg Oral Q12H  . vancomycin  125 mg Oral QID  . zinc sulfate  220 mg Oral Daily   Continuous:  [id number], acetaminophen, albuterol, cyclobenzaprine, ondansetron (ZOFRAN) IV, ondansetron, oxyCODONE-acetaminophen Anti-infectives   Start     Dose/Rate Route Frequency Ordered Stop   01/27/13 1900  vancomycin (VANCOCIN) 1,750 mg in sodium chloride 0.9 % 500 mL IVPB  Status:  Discontinued     1,750 mg 250 mL/hr over 120 Minutes Intravenous Every 48 hours 01/27/13 1420 01/28/13 1225   01/27/13 1130  vancomycin (VANCOCIN) 50 mg/mL oral solution 125 mg     125 mg Oral 4 times daily 01/27/13 1124 02/10/13 0959   01/26/13 2100  metroNIDAZOLE (FLAGYL) IVPB 500 mg  Status:  Discontinued     500 mg 100 mL/hr over 60 Minutes Intravenous Every 8 hours 01/26/13 1953 01/27/13 1123   01/26/13 1400  piperacillin-tazobactam (ZOSYN) IVPB 3.375 g  Status:  Discontinued     3.375 g 12.5 mL/hr over 240 Minutes Intravenous 3 times per day 01/26/13 1328 01/28/13 1225   01/25/13 1730  vancomycin (VANCOCIN) 2,500 mg in sodium chloride 0.9 % 500 mL IVPB     2,500 mg 250 mL/hr over 120 Minutes Intravenous  Once 01/25/13 1621 01/25/13 2041    01/25/13 0900  cefTRIAXone (ROCEPHIN) 2 g in dextrose 5 % 50 mL IVPB  Status:  Discontinued     2 g 100 mL/hr over 30 Minutes Intravenous Every 24 hours 01/25/13 0757 01/26/13 1242   01/25/13 0830  cefTRIAXone (ROCEPHIN) 1 g in dextrose 5 % 50 mL IVPB  Status:  Discontinued     1 g 100 mL/hr over 30 Minutes Intravenous Every 24 hours 01/25/13 0802 01/25/13 0810   01/24/13 2300  doxycycline (VIBRAMYCIN) 100 mg in dextrose 5 % 250 mL IVPB  Status:  Discontinued     100 mg 125 mL/hr over 120 Minutes Intravenous Every 12 hours 01/24/13 2247 01/25/13 1518      Results for orders placed during the hospital encounter of 01/24/13 (from the past 48 hour(s))  CLOSTRIDIUM DIFFICILE BY PCR     Status: Abnormal   Collection Time    01/26/13  3:01 PM      Result Value Range   C difficile by pcr POSITIVE (*) NEGATIVE   Comment: CRITICAL RESULT CALLED TO, READ BACK BY AND VERIFIED WITH:     CIJANIT T,RN 1830 01/26/13 SCALES H  BASIC METABOLIC PANEL     Status: Abnormal   Collection Time    01/26/13  3:11 PM      Result Value Range   Sodium 138  135 - 145 mEq/L   Potassium 3.8  3.5 - 5.1 mEq/L   Chloride 106  96 - 112 mEq/L   CO2 19  19 - 32 mEq/L   Glucose, Bld 119 (*) 70 - 99 mg/dL   BUN 79 (*) 6 - 23 mg/dL   Creatinine, Ser 4.06 (*) 0.50 - 1.10 mg/dL   Calcium 7.9 (*) 8.4 - 10.5 mg/dL   GFR calc non Af Amer 12 (*) >  90 mL/min   GFR calc Af Amer 14 (*) >90 mL/min   Comment:            The eGFR has been calculated     using the CKD EPI equation.     This calculation has not been     validated in all clinical     situations.     eGFR's persistently     <90 mL/min signify     possible Chronic Kidney Disease.  GLUCOSE, CAPILLARY     Status: Abnormal   Collection Time    01/26/13  4:45 PM      Result Value Range   Glucose-Capillary 168 (*) 70 - 99 mg/dL  GLUCOSE, CAPILLARY     Status: Abnormal   Collection Time    01/26/13  9:08 PM      Result Value Range   Glucose-Capillary 143 (*) 70 -  99 mg/dL  BASIC METABOLIC PANEL     Status: Abnormal   Collection Time    01/27/13  1:03 AM      Result Value Range   Sodium 135  135 - 145 mEq/L   Potassium 3.7  3.5 - 5.1 mEq/L   Chloride 104  96 - 112 mEq/L   CO2 18 (*) 19 - 32 mEq/L   Glucose, Bld 224 (*) 70 - 99 mg/dL   BUN 81 (*) 6 - 23 mg/dL   Creatinine, Ser 4.09 (*) 0.50 - 1.10 mg/dL   Calcium 7.2 (*) 8.4 - 10.5 mg/dL   GFR calc non Af Amer 12 (*) >90 mL/min   GFR calc Af Amer 14 (*) >90 mL/min   Comment:            The eGFR has been calculated     using the CKD EPI equation.     This calculation has not been     validated in all clinical     situations.     eGFR's persistently     <90 mL/min signify     possible Chronic Kidney Disease.  GLUCOSE, CAPILLARY     Status: Abnormal   Collection Time    01/27/13  6:25 AM      Result Value Range   Glucose-Capillary 151 (*) 70 - 99 mg/dL  BASIC METABOLIC PANEL     Status: Abnormal   Collection Time    01/27/13  7:52 AM      Result Value Range   Sodium 136  135 - 145 mEq/L   Potassium 3.3 (*) 3.5 - 5.1 mEq/L   Chloride 103  96 - 112 mEq/L   CO2 20  19 - 32 mEq/L   Glucose, Bld 141 (*) 70 - 99 mg/dL   BUN 80 (*) 6 - 23 mg/dL   Creatinine, Ser 4.02 (*) 0.50 - 1.10 mg/dL   Calcium 7.6 (*) 8.4 - 10.5 mg/dL   GFR calc non Af Amer 12 (*) >90 mL/min   GFR calc Af Amer 14 (*) >90 mL/min   Comment:            The eGFR has been calculated     using the CKD EPI equation.     This calculation has not been     validated in all clinical     situations.     eGFR's persistently     <90 mL/min signify     possible Chronic Kidney Disease.  CBC     Status: Abnormal   Collection Time  01/27/13  7:52 AM      Result Value Range   WBC 21.1 (*) 4.0 - 10.5 K/uL   RBC 2.33 (*) 3.87 - 5.11 MIL/uL   Hemoglobin 6.7 (*) 12.0 - 15.0 g/dL   Comment: CRITICAL RESULT CALLED TO, READ BACK BY AND VERIFIED WITH:     TRIPP A RN 01/27/13 0832 COSTELLO B     REPEATED TO VERIFY   HCT 21.3 (*)  36.0 - 46.0 %   MCV 91.4  78.0 - 100.0 fL   MCH 28.8  26.0 - 34.0 pg   MCHC 31.5  30.0 - 36.0 g/dL   RDW 17.2 (*) 11.5 - 15.5 %   Platelets 214  150 - 400 K/uL  TYPE AND SCREEN     Status: None   Collection Time    01/27/13 11:15 AM      Result Value Range   ABO/RH(D) B POS     Antibody Screen NEG     Sample Expiration 01/30/2013     Unit Number [id number]     Blood Component Type RED CELLS,LR     Unit division 00     Status of Unit ISSUED,FINAL     Transfusion Status OK TO TRANSFUSE     Crossmatch Result Compatible    PREPARE RBC (CROSSMATCH)     Status: None   Collection Time    01/27/13 11:15 AM      Result Value Range   Order Confirmation ORDER PROCESSED BY BLOOD BANK    GLUCOSE, CAPILLARY     Status: Abnormal   Collection Time    01/27/13 11:22 AM      Result Value Range   Glucose-Capillary 145 (*) 70 - 99 mg/dL  GLUCOSE, CAPILLARY     Status: Abnormal   Collection Time    01/27/13  4:36 PM      Result Value Range   Glucose-Capillary 147 (*) 70 - 99 mg/dL  HEMOGLOBIN AND HEMATOCRIT, BLOOD     Status: Abnormal   Collection Time    01/27/13  7:00 PM      Result Value Range   Hemoglobin 7.3 (*) 12.0 - 15.0 g/dL   HCT 23.1 (*) 36.0 - 46.0 %  GLUCOSE, CAPILLARY     Status: Abnormal   Collection Time    01/27/13  9:16 PM      Result Value Range   Glucose-Capillary 166 (*) 70 - 99 mg/dL   Comment 1 Notify RN     Comment 2 Documented in Chart    RENAL FUNCTION PANEL     Status: Abnormal   Collection Time    01/28/13  5:00 AM      Result Value Range   Sodium 140  135 - 145 mEq/L   Potassium 3.3 (*) 3.5 - 5.1 mEq/L   Chloride 103  96 - 112 mEq/L   CO2 23  19 - 32 mEq/L   Glucose, Bld 104 (*) 70 - 99 mg/dL   BUN 72 (*) 6 - 23 mg/dL   Creatinine, Ser 3.58 (*) 0.50 - 1.10 mg/dL   Calcium 7.5 (*) 8.4 - 10.5 mg/dL   Phosphorus 3.8  2.3 - 4.6 mg/dL   Albumin 2.0 (*) 3.5 - 5.2 g/dL   GFR calc non Af Amer 14 (*) >90 mL/min   GFR calc Af Amer 16 (*) >90 mL/min    Comment:            The eGFR has been calculated  using the CKD EPI equation.     This calculation has not been     validated in all clinical     situations.     eGFR's persistently     <90 mL/min signify     possible Chronic Kidney Disease.  GLUCOSE, CAPILLARY     Status: Abnormal   Collection Time    01/28/13  6:27 AM      Result Value Range   Glucose-Capillary 104 (*) 70 - 99 mg/dL  CBC     Status: Abnormal   Collection Time    01/28/13  7:05 AM      Result Value Range   WBC 19.2 (*) 4.0 - 10.5 K/uL   RBC 2.61 (*) 3.87 - 5.11 MIL/uL   Hemoglobin 7.5 (*) 12.0 - 15.0 g/dL   HCT 23.2 (*) 36.0 - 46.0 %   MCV 88.9  78.0 - 100.0 fL   MCH 28.7  26.0 - 34.0 pg   MCHC 32.3  30.0 - 36.0 g/dL   RDW 17.6 (*) 11.5 - 15.5 %   Platelets 222  150 - 400 K/uL  GLUCOSE, CAPILLARY     Status: Abnormal   Collection Time    01/28/13 11:44 AM      Result Value Range   Glucose-Capillary 129 (*) 70 - 99 mg/dL   Comment 1 Notify RN      No results found.  Review of Systems  Respiratory: Positive for shortness of breath.   Skin:       She has had issues with hidradenitis for 5-6 years, multiple I&D procedures and went to [facility] after one according to her mother.  Pt. Does not remember.  All other systems reviewed and are negative.   Blood pressure 113/68, pulse 69, temperature 98 F (36.7 C), temperature source Oral, resp. rate 18, height 5\' 7"  (1.702 m), weight 280 lb 1.6 oz (127.053 kg), last menstrual period 12/24/2012, SpO2 96.00%. Physical Exam  Constitutional: She is oriented to person, place, and time. No distress.  Chronically ill obese AAF in no acute distress.  HENT:  Head: Normocephalic and atraumatic.  Nose: Nose normal.  Eyes: Conjunctivae and EOM are normal. Pupils are equal, round, and reactive to light. Right eye exhibits no discharge. Left eye exhibits no discharge. No scleral icterus.  Neck: Normal range of motion. Neck supple. No JVD present. No tracheal deviation  present. No thyromegaly present.  Cardiovascular: Normal rate, regular rhythm, normal heart sounds and intact distal pulses.   Respiratory: Effort normal and breath sounds normal. No stridor. No respiratory distress. She has no wheezes. She has no rales. She exhibits no tenderness.  GI: Soft. Bowel sounds are normal. She exhibits no distension and no mass. There is no tenderness. There is no rebound and no guarding.    She has a large panus with 2 areas under the panus one about 2 cm in diameteron the right, that isn't currently draining.  There is a second area on the left that is about 7 x 2 cm that is flat and draining some.  Genitourinary:     Musculoskeletal: She exhibits edema (trace).  Lymphadenopathy:    She has no cervical adenopathy.  Neurological: She is alert and oriented to person, place, and time. No cranial nerve deficit.  Able to walk to bed with effort.  Skin: She is not diaphoretic.  Areas of hirandenitis   Psychiatric: She has a normal mood and affect. Her behavior is normal. Judgment and thought content  normal.    Assessment/Plan: 1. Supprative hydradenitis 2. Stage III renal disease with history of bilateral ureteral stent placements. 3.AODM type II 4.CHF 5.Hypertension 6.Hypothyroid 7.Hyperkalemia 8.Anemia 9. Body mass index is 43.8 10. Hx of depression 11. C diff colitis  Plan:  I would continue local wound care, will use a pillow case between panus and abdominal wall, to help with absorption, along with washing these areas 2-3 times per day., some heat packs to area beneath the panus.  Sitz baths to rectal areas. Dr. Excell Seltzer will see her also, I don't see anything currently that needs draining in the OR. Will Creig Hines PA  for Dr. Excell Seltzer.  JENNINGS,WILLARD 01/28/2013, 1:57 PM

## 2013-01-29 LAB — CBC
MCH: 28.7 pg (ref 26.0–34.0)
MCHC: 32.5 g/dL (ref 30.0–36.0)
MCV: 88.4 fL (ref 78.0–100.0)
Platelets: 212 10*3/uL (ref 150–400)
RDW: 17.5 % — ABNORMAL HIGH (ref 11.5–15.5)

## 2013-01-29 LAB — RENAL FUNCTION PANEL
Albumin: 2.1 g/dL — ABNORMAL LOW (ref 3.5–5.2)
Calcium: 7.7 mg/dL — ABNORMAL LOW (ref 8.4–10.5)
Creatinine, Ser: 3.25 mg/dL — ABNORMAL HIGH (ref 0.50–1.10)
GFR calc non Af Amer: 15 mL/min — ABNORMAL LOW (ref >90)
Phosphorus: 4 mg/dL (ref 2.3–4.6)
Sodium: 140 mEq/L (ref 135–145)

## 2013-01-29 LAB — GLUCOSE, CAPILLARY: Glucose-Capillary: 123 mg/dL — ABNORMAL HIGH (ref 70–99)

## 2013-01-29 MED ORDER — SODIUM BICARBONATE 650 MG PO TABS: 650.0000 mg | ORAL_TABLET | Freq: Two times a day (BID) | ORAL | Status: DC

## 2013-01-29 MED ORDER — DARBEPOETIN ALFA-POLYSORBATE 60 MCG/0.3ML IJ SOLN
60.0000 ug | INTRAMUSCULAR | Status: DC
  Filled 2013-01-29: qty 0.3

## 2013-01-29 NOTE — Progress Notes (Signed)
S:No new CO  Wants to go home O:BP 133/69  Pulse 74  Temp(Src) 98 F (36.7 C) (Oral)  Resp 20  Ht 5\' 7"  (1.702 m)  Wt 126.4 kg (278 lb 10.6 oz)  BMI 43.63 kg/m2  SpO2 97%  LMP 12/24/2012  Intake/Output Summary (Last 24 hours) at 01/29/13 0831 Last data filed at 01/29/13 0216  Gross per 24 hour  Intake    922 ml  Output    601 ml  Net    321 ml   Weight change: -0.653 kg (-1 lb 7 oz) [id number] and alert CVS:RRR Resp:clear Abd:+ BS NTNT Ext:no edema NEURO:CNI Ox3 no asterixis   . aspirin EC  81 mg Oral Daily  . calcitRIOL  0.25 mcg Oral Daily  . calcium carbonate  2 tablet Oral Daily  . carvedilol  25 mg Oral BID WC  . DULoxetine  60 mg Oral Daily  . feeding supplement  1 Container Oral TID BM  . ferrous sulfate  325 mg Oral BID  . folic acid  1 mg Oral Daily  . heparin  5,000 Units Subcutaneous Q8H  . insulin aspart  0-9 Units Subcutaneous TID WC  . isosorbide mononitrate  30 mg Oral Daily  . levothyroxine  25 mcg Oral QAC breakfast  . saccharomyces boulardii  250 mg Oral BID  . simvastatin  20 mg Oral q1800  . sodium bicarbonate  650 mg Oral BID  . sodium chloride  3 mL Intravenous Q12H  . sodium chloride  3 mL Intravenous Q12H  . terbinafine  1 application Topical Daily  . traMADol  50 mg Oral Q12H  . vancomycin  125 mg Oral QID  . zinc sulfate  220 mg Oral Daily   No results found. BMET    Component Value Date/Time   NA 140 01/29/2013 0628   K 3.7 01/29/2013 0628   CL 106 01/29/2013 0628   CO2 23 01/29/2013 0628   GLUCOSE 104* 01/29/2013 0628   BUN 67* 01/29/2013 0628   CREATININE 3.25* 01/29/2013 0628   CREATININE 1.90* 03/07/2012 1652   CALCIUM 7.7* 01/29/2013 0628   GFRNONAA 15* 01/29/2013 0628   GFRAA 18* 01/29/2013 0628   CBC    Component Value Date/Time   WBC 15.4* 01/29/2013 0628   WBC 17.8* 06/01/2008 1547   RBC 2.58* 01/29/2013 0628   RBC 4.50 06/01/2008 1547   HGB 7.4* 01/29/2013 0628   HGB 10.1* 06/01/2008 1547   HCT 22.8* 01/29/2013 0628   HCT 33.0* 06/01/2008  1547   PLT 212 01/29/2013 0628   PLT 452* 06/01/2008 1547   MCV 88.4 01/29/2013 0628   MCV 73.2* 06/01/2008 1547   MCH 28.7 01/29/2013 0628   MCH 22.5* 06/01/2008 1547   MCHC 32.5 01/29/2013 0628   MCHC 30.7* 06/01/2008 1547   RDW 17.5* 01/29/2013 0628   RDW 18.4* 06/01/2008 1547   LYMPHSABS 1.1 01/24/2013 1903   LYMPHSABS 1.1 06/01/2008 1547   MONOABS 0.7 01/24/2013 1903   MONOABS 0.6 06/01/2008 1547   EOSABS 0.8* 01/24/2013 1903   EOSABS 0.5 06/01/2008 1547   BASOSABS 0.0 01/24/2013 1903   BASOSABS 0.0 06/01/2008 1547     Assessment: 1. Acute on CKD 4, renal fx improving and at baseline 2. C Diff colitis 3. Suppuritive Hidradenitis 4. HTN 5. DM 6. Renal stones, Hx Rt ureteral stent.  No hydro on [place] 7. Metabolic Acidosis, improving 8. anemia  Plan: 1. Await iron studies 2.  Start aranesp   MATTINGLY,MICHAEL T

## 2013-01-29 NOTE — Progress Notes (Signed)
Discharge instructions were reviewed with patient including meds, wound care and follow up appointment. Emelyn Roen, Joselyn Glassman, RN

## 2013-01-29 NOTE — Progress Notes (Signed)
Subjective: Reports abdominal/pelvic region is less painful.  No new concerns  Objective: Vital signs in last 24 hours: Temp:  [98 F (36.7 C)-98.1 F (36.7 C)] 98 F (36.7 C) (03/05 0515) Pulse Rate:  [69-74] 74 (03/05 0515) Resp:  [18-20] 20 (03/05 0515) BP: (113-143)/(68-76) 133/69 mmHg (03/05 0515) SpO2:  [96 %-98 %] 97 % (03/05 0515) Weight:  [278 lb 10.6 oz (126.4 kg)] 278 lb 10.6 oz (126.4 kg) (03/05 0500) Last BM Date: 01/28/13  Intake/Output from previous day: 03/04 0701 - 03/05 0700 In: 35 [P.O.:922] Out: 601 [Urine:600; Stool:1] Intake/Output this shift: Total I/O In: 120 [P.O.:120] Out: -   PE: Pelvic:  Exam limited by body habitus however no large areas of fluctuance.  Large pannus and intragluteal folds moist with multiple areas of scarring, no active drainage noted today, multiple prior I&D sights with dense scarring  Lab Results:   Recent Labs  01/28/13 0705 01/29/13 0628  WBC 19.2* 15.4*  HGB 7.5* 7.4*  HCT 23.2* 22.8*  PLT 222 212   BMET  Recent Labs  01/28/13 0500 01/29/13 0628  NA 140 140  K 3.3* 3.7  CL 103 106  CO2 23 23  GLUCOSE 104* 104*  BUN 72* 67*  CREATININE 3.58* 3.25*  CALCIUM 7.5* 7.7*   PT/INR No results found for this basename: LABPROT, INR,  in the last 72 hours CMP     Component Value Date/Time   NA 140 01/29/2013 0628   K 3.7 01/29/2013 0628   CL 106 01/29/2013 0628   CO2 23 01/29/2013 0628   GLUCOSE 104* 01/29/2013 0628   BUN 67* 01/29/2013 0628   CREATININE 3.25* 01/29/2013 0628   CREATININE 1.90* 03/07/2012 1652   CALCIUM 7.7* 01/29/2013 0628   PROT 7.0 11/21/2012 0445   ALBUMIN 2.1* 01/29/2013 0628   AST 10 11/21/2012 0445   ALT <5 11/21/2012 0445   ALKPHOS 90 11/21/2012 0445   BILITOT 0.3 11/21/2012 0445   GFRNONAA 15* 01/29/2013 0628   GFRAA 18* 01/29/2013 0628   Lipase  No results found for this basename: lipase       Studies/Results: No results found.  Anti-infectives: Anti-infectives   Start      Dose/Rate Route Frequency Ordered Stop   01/27/13 1900  vancomycin (VANCOCIN) 1,750 mg in sodium chloride 0.9 % 500 mL IVPB  Status:  Discontinued     1,750 mg 250 mL/hr over 120 Minutes Intravenous Every 48 hours 01/27/13 1420 01/28/13 1225   01/27/13 1130  vancomycin (VANCOCIN) 50 mg/mL oral solution 125 mg     125 mg Oral 4 times daily 01/27/13 1124 02/10/13 0959   01/26/13 2100  metroNIDAZOLE (FLAGYL) IVPB 500 mg  Status:  Discontinued     500 mg 100 mL/hr over 60 Minutes Intravenous Every 8 hours 01/26/13 1953 01/27/13 1123   01/26/13 1400  piperacillin-tazobactam (ZOSYN) IVPB 3.375 g  Status:  Discontinued     3.375 g 12.5 mL/hr over 240 Minutes Intravenous 3 times per day 01/26/13 1328 01/28/13 1225   01/25/13 1730  vancomycin (VANCOCIN) 2,500 mg in sodium chloride 0.9 % 500 mL IVPB     2,500 mg 250 mL/hr over 120 Minutes Intravenous  Once 01/25/13 1621 01/25/13 2041   01/25/13 0900  cefTRIAXone (ROCEPHIN) 2 g in dextrose 5 % 50 mL IVPB  Status:  Discontinued     2 g 100 mL/hr over 30 Minutes Intravenous Every 24 hours 01/25/13 0757 01/26/13 1242   01/25/13 0830  cefTRIAXone (ROCEPHIN) 1  g in dextrose 5 % 50 mL IVPB  Status:  Discontinued     1 g 100 mL/hr over 30 Minutes Intravenous Every 24 hours 01/25/13 0802 01/25/13 0810   01/24/13 2300  doxycycline (VIBRAMYCIN) 100 mg in dextrose 5 % 250 mL IVPB  Status:  Discontinued     100 mg 125 mL/hr over 120 Minutes Intravenous Every 12 hours 01/24/13 2247 01/25/13 1518       Assessment/Plan 1. Supprative hydradenitis with leukocytosis - declines further surgical treatment at this time 2. Stage III renal disease with history of bilateral ureteral stent placements.  3.AODM type II  4.CHF  5.Hypertension  6.Hypothyroid  7.Hyperkalemia  8.Anemia  9. Body mass index is 43.8  10. Hx of depression  11. C diff colitis  Plan: I would continue local wound care, Discussed again with nursing to use a pillow case between panus and  abdominal wall as well as inguinal/gluteal folds to help with absorption, along with washing these areas 2-3 times per day. Heat packs to area beneath the panus for sx treatment.  Sitz baths to rectal areas.   Pt declines further surgical intervention and is improving symptomatically.  Remains afebrile with improving leukocytosis.  Pt will be seen again by Dr. Excell Seltzer but plan for surgery team to sign off today.  Please call if any further surgical concerns arise.       LOS: 5 days   Gerda Diss, [facility] Resident - PGY-2 01/29/2013 9:28 AM Pager: [phone]

## 2013-01-29 NOTE — Discharge Summary (Signed)
Physician Discharge Summary  [name] [id number] DOB: 12/15/1960 DOA: 01/24/2013  PCP: [name], MD  Admit date: 01/24/2013 Discharge date: 01/29/2013  Time spent: Greater than 30 minutes  Recommendations for Outpatient Follow-up:  1. With Dr. Harlan Stains, PCP in 1 week with repeat labs (CBC, BMP & Mg) 2. With Dr. Erling Cruz, Nephrology. 3. Magnesium Oxide has been held: decide OP when and if to restart. 4. Lasix held secondary to recent Acute Renal Failure: decide on OP follow up when to resume.   Discharge Diagnoses:  Principal Problem:   Renal failure (ARF), acute on chronic Active Problems:   Hidradenitis suppurativa   Metabolic acidosis   Leucocytosis   Discharge Condition: Improved & Stable  Diet recommendation: Renal and Diabetic diet.  Filed Weights   01/27/13 0627 01/28/13 0636 01/29/13 0500  Weight: 126.145 kg (278 lb 1.6 oz) 127.053 kg (280 lb 1.6 oz) 126.4 kg (278 lb 10.6 oz)    History of present illness:  52 y.o. female who had complicated course last December 2 months ago after patient had septic shock from pneumonia, UTI, had cardiac arrest and was on ventilator during that stay and patient also briefly had dialysis now presented on 2/28 to the ER after being referred by patient's PCP once patient's blood work showed hyperkalemia. Patient otherwise states that she has no specific complaints. Denies any chest pain shortness of breath nausea vomiting abdominal pain fever chills dizziness loss of consciousness. She specifically denies diarrhea prior to admission and actually stated that she was constipated. Patient's labs were repeated the ER shows hyperkalemia with metabolic acidosis and no EKG changes. Patient also has leukocytosis but afebrile. 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 He does have drainage from the site but patient and husband states the drainage has not been  increased.  Hospital Course:  1. Acute on stage IV chronic renal failure/hyperkalemia/metabolic acidosis: nephrology was consulted. She was treated with Kayexalate. Hyperkalemia resolved. Patient's creatinine is at baseline. Nephrology recommends discharging home on sodium bicarbonate, hold magnesium and outpatient followup with Dr. Florene Glen. 2. SIRS: Initially skin and urine were thought to be the source of infection. Patient was empirically started on IV Zosyn and vancomycin. Blood cultures are negative to date. Wound cultures grew multiple organisms. Skin of her lower abdomen does not look overly infected. Dr. Doyle Askew discussed her case with ID M.D. on call who recommended discontinuing IV antibiotics for this indication. 3. Positive C. difficile PCR: Patient came in with leukocytosis which increased in hospital to 25,000. She specifically indicated that she was constipated. She was empirically started on oral vancomycin. However, today discussed her care with ID M.D. on call who recommended that in the absence of diarrhea, it would be reasonable to discontinue vancomycin. The positive result may be secondary to colonization. Patient and spouse have been advised that if she were to start having diarrhea, to followup with PCP at which time treatment for C. difficile may be considered. 4. Type 2 diabetes mellitus: Reasonable inpatient control. Was on diet control alone, prior to admission. 5. Hypothyroidism: Continue Synthroid. 6. History of chronic systolic CHF: LVEF [id number] by echo last December. Lasix currently held secondary to acute renal failure. Clinically seems compensated. Outpatient followup with PCP in a week at which time decision to resume Lasix can be made. 7. Acute on chronic anemia: Status post 1 unit PRBC transfusion on 01/27/2013. Stable. Follow up CBC in a week's time. Iron studies  have been requested and will be followed by nephrology as outpatient 8. Hidradenitis suppurativa: She was  initially empirically started on IV vancomycin. Her care was discussed with ID and vancomycin was discontinued. Surgery was consulted and recommended local wound care. Patient also declined surgical intervention. Improving. 9. Morbid obesity, BMI >40 10. Hypertension: Controlled  Procedures:  None  Consultations:  Nephrology   Surgery  Infectious disease (non- formal)   Discharge Exam:  Complaints: Patient was constipated until this morning. A soft BM this morning. Denies abdominal pain or diarrhea. Denies any other complaints.  Filed Vitals:   01/28/13 2112 01/29/13 0500 01/29/13 0515 01/29/13 0942  BP: 139/72  133/69 126/77  Pulse: 72  74 68  Temp: 98.1 F (36.7 C)  98 F (36.7 C)   TempSrc: Oral  Oral   Resp: 20  20   Height:      Weight:  126.4 kg (278 lb 10.6 oz)    SpO2: 98%  97%     Patient examined with female nurse chaperone in the room  General exam: Comfortable. Morbidly obese. Sitting up in chair.   Respiratory system: Clear. No increased work of breathing.  Cardiovascular system: S1 and S2 heard, RRR. No JVD, murmurs or pedal edema.  Gastrointestinal system: Abdomen is nondistended, soft and nontender. Normal bowel sounds heard. Under her abdominal pannus, especially on the left side-several lesions, with pads slightly wet but no overt signs of infection   Central nervous system: Alert and oriented. No focal neurological deficits.  Extremities: Symmetric 5 x 5 power.  Discharge Instructions      Discharge Orders   Future Orders Complete By Expires     ([place]) Call MD:  Anytime you have any of the following symptoms: 1) 3 pound weight gain in 24 hours or 5 pounds in 1 week 2) shortness of breath, with or without a dry hacking cough 3) swelling in the hands, feet or stomach 4) if you have to sleep on extra pillows at night in order to breathe.  As directed     Call MD for:  extreme fatigue  As directed     Call MD for:  persistant  dizziness or light-headedness  As directed     Call MD for:  persistant nausea and vomiting  As directed     Call MD for:  redness, tenderness, or signs of infection (pain, swelling, redness, odor or green/yellow discharge around incision site)  As directed     Call MD for:  severe uncontrolled pain  As directed     Call MD for:  temperature >100.4  As directed     Call MD for:  As directed     Comments:      Diarrhea.    Discharge instructions  As directed     Comments:      DIET: Renal diet.    Discharge wound care:  As directed     Comments:      use a pillow case between panus and abdominal wall as well as inguinal/gluteal folds (as per advise) to help with absorption, along with washing these areas 2-3 times per day.  Heat packs to area beneath the panus for symptom treatment.  Sitz baths to rectal areas.    Increase activity slowly  As directed         Medication List    STOP taking these medications       furosemide 80 MG tablet  Commonly known as:  LASIX     magnesium oxide 400 MG tablet  Commonly known as:  MAG-OX     sulfamethoxazole-trimethoprim 400-80 MG per tablet  Commonly known as:  BACTRIM,SEPTRA      TAKE these medications       aspirin 81 MG EC tablet  Take 1 tablet (81 mg total) by mouth daily.     calcitRIOL 0.25 MCG capsule  Commonly known as:  ROCALTROL  Take 1 capsule (0.25 mcg total) by mouth daily.     calcium carbonate 1250 MG tablet  Commonly known as:  OS-CAL - dosed in mg of elemental calcium  Take 2 tablets (1,000 mg of elemental calcium total) by mouth daily.     carvedilol 25 MG tablet  Commonly known as:  COREG  Take 1 tablet (25 mg total) by mouth 2 (two) times daily with a meal.     cyclobenzaprine 10 MG tablet  Commonly known as:  FLEXERIL  Take 1 tablet (10 mg total) by mouth 3 (three) times daily as needed for muscle spasms. For Spasms     DULoxetine 60 MG capsule  Commonly known as:  CYMBALTA  Take 1 capsule (60 mg total)  by mouth daily.     feeding supplement Liqd  Take 1 Container by mouth 2 (two) times daily between meals.     ferrous sulfate 325 (65 FE) MG tablet  Take 1 tablet (325 mg total) by mouth 2 (two) times daily.     folic acid 1 MG tablet  Commonly known as:  FOLVITE  Take 1 tablet (1 mg total) by mouth daily.     isosorbide mononitrate 30 MG 24 hr tablet  Commonly known as:  IMDUR  Take 1 tablet (30 mg total) by mouth daily.     levothyroxine 25 MCG tablet  Commonly known as:  SYNTHROID, LEVOTHROID  Take 1 tablet (25 mcg total) by mouth every morning. Take on an empty stomach     oxyCODONE-acetaminophen 5-325 MG per tablet  Commonly known as:  PERCOCET/ROXICET  Take 1 tablet by mouth every 6 (six) hours as needed for pain.     pravastatin 40 MG tablet  Commonly known as:  PRAVACHOL  Take 1 tablet (40 mg total) by mouth daily.     saccharomyces boulardii 250 MG capsule  Commonly known as:  FLORASTOR  Take 1 capsule (250 mg total) by mouth 2 (two) times daily.     sodium bicarbonate 650 MG tablet  Take 1 tablet (650 mg total) by mouth 2 (two) times daily.     sodium hypochlorite 0.125 % Soln  Commonly known as:  DAKIN'S 1/4 STRENGTH  Irrigate with 1 application as directed daily as needed. Apply to wound with dressing changes     terbinafine 1 % cream  Commonly known as:  LAMISIL  Apply 1 application topically daily. Applies under stomach     traMADol 50 MG tablet  Commonly known as:  ULTRAM  Take 1 tablet (50 mg total) by mouth every 12 (twelve) hours.     vitamin C 500 MG tablet  Commonly known as:  ASCORBIC ACID  Take 500 mg by mouth daily.     zinc sulfate 220 MG capsule  Take 220 mg by mouth daily.       Follow-up Information   Follow up with [name], MD. Schedule an appointment as soon as possible for a visit in 1 week. (To be seen with repeat labs (CBC, BMP & Mg))  Contact information:   [place] [address] [phone]        Schedule an appointment as soon as possible for a visit with Estanislado Emms, MD.   Contact information:   [place]  [phone]        The results of significant diagnostics from this hospitalization (including imaging, microbiology, ancillary and laboratory) are listed below for reference.    Significant Diagnostic Studies: US Renal  01/24/2013  *RADIOLOGY REPORT*  Clinical Data: Abnormal labs  RENAL/URINARY TRACT ULTRASOUND COMPLETE  Comparison:  12/31/2012  Findings:  Right Kidney:  Increased echogenicity. Portions of the lower pole are obscured by bowel gas artifact.  Allowing for this, no focal abnormality.  No hydronephrosis.  Measures 11.7 cm.  Left Kidney:  Increased echogenicity.  No focal abnormality.  No hydronephrosis.  Measures 10.1 cm. Nephro ureteral stent noted.  Bladder:  Within normal limits. The ureteral stent extends into the bladder.  IMPRESSION: Echogenic kidneys suggesting underlying medical renal disease.  No hydronephrosis.  Left Nephro ureteral stent.   Original Report Authenticated By: Carlos Levering, M.D.    US Renal  12/31/2012  *RADIOLOGY REPORT*  Clinical Data: Chronic kidney disease.  Rule out hydronephrosis.  RENAL/URINARY TRACT ULTRASOUND COMPLETE  Comparison:  CT of 11/12/2012.  Renal ultrasound 11/11/2012.  Findings:  Right Kidney:  11.8 cm. No hydronephrosis.  Increased echogenicity.  Left Kidney:  12.3 cm. No hydronephrosis.  Increased echogenicity.  Bladder:  Within normal limits.  Incidental note is made of small volume perihepatic ascites.  IMPRESSION:  1. No hydronephrosis. 2.  Increased echogenicity of the kidneys, suggesting medical renal disease. 3.  Small volume ascites.   Original Report Authenticated By: Abigail Miyamoto, M.D.    Dg Chest Port 1 View  01/25/2013  *RADIOLOGY REPORT*  Clinical Data: Leukocytosis, history of pneumonia  PORTABLE CHEST - 1 VIEW  Comparison: 11/26/2012; 11/19/2012   Findings: Examination is degraded secondary to the decreased lung volumes, patient body habitus and portable technique.  Grossly unchanged enlarged cardiac silhouette and mediastinal contours.  Overall improved aeration of the lungs with persistent perihilar and medial basilar heterogeneous opacities.  Mild cephalization of flow without frank evidence of edema.  No definite pleural effusion or pneumothorax, though the medial aspect the bilateral lung apices excluded secondary to overlying chin. Grossly unchanged bones.  IMPRESSION: 1.  Degraded examination without definite acute cardiopulmonary disease. Further evaluation with a PA and lateral chest radiograph may be obtained as clinically indicated. 2.  Improved aeration of the lungs with persistent perihilar and bibasilar opacities favored to represent atelectasis. 3.  Pulmonary venous congestion without frank evidence of edema.   Original Report Authenticated By: Jake Seats, MD     Microbiology: Recent Results (from the past 240 hour(s))  URINE CULTURE     Status: None   Collection Time    01/25/13 12:10 AM      Result Value Range Status   Specimen Description URINE, RANDOM   Final   Special Requests CX ADDED AT 0034 ON [id number]   Final   Culture  Setup Time 01/25/2013 05:23   Final   Colony Count >=100,000 COLONIES/ML   Final   Culture     Final   Value: Multiple bacterial morphotypes present, none predominant. Suggest appropriate recollection if clinically indicated.   Report Status 01/26/2013 FINAL   Final  WOUND CULTURE     Status: None   Collection Time  01/25/13  7:39 AM      Result Value Range Status   Specimen Description WOUND BUTTOCKS   Final   Special Requests NONE   Final   Gram Stain     Final   Value: NO WBC SEEN     RARE SQUAMOUS EPITHELIAL CELLS PRESENT     RARE GRAM POSITIVE COCCI IN PAIRS   Culture     Final   Value: MULTIPLE ORGANISMS PRESENT, NONE PREDOMINANT     Note: NO STAPHYLOCOCCUS AUREUS ISOLATED NO GROUP A  STREP (S.PYOGENES) ISOLATED   Report Status 01/27/2013 FINAL   Final  WOUND CULTURE     Status: None   Collection Time    01/25/13  8:52 AM      Result Value Range Status   Specimen Description WOUND LEFT GROIN   Final   Special Requests NONE   Final   Gram Stain     Final   Value: NO WBC SEEN     NO SQUAMOUS EPITHELIAL CELLS SEEN     RARE GRAM NEGATIVE RODS   Culture     Final   Value: MULTIPLE ORGANISMS PRESENT, NONE PREDOMINANT     Note: NO STAPHYLOCOCCUS AUREUS ISOLATED NO GROUP A STREP (S.PYOGENES) ISOLATED   Report Status 01/27/2013 FINAL   Final  CULTURE, BLOOD (ROUTINE X 2)     Status: None   Collection Time    01/25/13 11:36 PM      Result Value Range Status   Specimen Description BLOOD LEFT HAND   Final   Special Requests BOTTLES DRAWN AEROBIC ONLY 10CC   Final   Culture  Setup Time 01/26/2013 06:24   Final   Culture     Final   Value:        BLOOD CULTURE RECEIVED NO GROWTH TO DATE CULTURE WILL BE HELD FOR 5 DAYS BEFORE ISSUING A FINAL NEGATIVE REPORT   Report Status PENDING   Incomplete  CULTURE, BLOOD (ROUTINE X 2)     Status: None   Collection Time    01/25/13 11:42 PM      Result Value Range Status   Specimen Description BLOOD LEFT HAND   Final   Special Requests BOTTLES DRAWN AEROBIC ONLY 10CC   Final   Culture  Setup Time 01/26/2013 06:24   Final   Culture     Final   Value:        BLOOD CULTURE RECEIVED NO GROWTH TO DATE CULTURE WILL BE HELD FOR 5 DAYS BEFORE ISSUING A FINAL NEGATIVE REPORT   Report Status PENDING   Incomplete  CLOSTRIDIUM DIFFICILE BY PCR     Status: Abnormal   Collection Time    01/26/13  3:01 PM      Result Value Range Status   C difficile by pcr POSITIVE (*) NEGATIVE Final   Comment: CRITICAL RESULT CALLED TO, READ BACK BY AND VERIFIED WITH:     CIJANIT T,RN 1830 01/26/13 SCALES H     Labs: Basic Metabolic Panel:  Recent Labs Lab 01/25/13 0156  01/26/13 1511 01/27/13 0103 01/27/13 0752 01/28/13 0500 01/29/13 0628  NA 136  <  > 138 135 136 140 140  K 6.4*  < > 3.8 3.7 3.3* 3.3* 3.7  CL 111  < > 106 104 103 103 106  CO2 13*  < > 19 18* 20 23 23   GLUCOSE 139*  < > 119* 224* 141* 104* 104*  BUN 87*  < > 79* 81* 80* 72* 67*  CREATININE 4.37*  < > 4.06* 4.09* 4.02* 3.58* 3.25*  CALCIUM 8.7  < > 7.9* 7.2* 7.6* 7.5* 7.7*  MG 2.3  --   --   --   --   --   --   PHOS  --   --   --   --   --  3.8 4.0  < > = values in this interval not displayed. Liver Function Tests:  Recent Labs Lab 01/28/13 0500 01/29/13 0628  ALBUMIN 2.0* 2.1*   No results found for this basename: LIPASE, AMYLASE,  in the last 168 hours No results found for this basename: AMMONIA,  in the last 168 hours CBC:  Recent Labs Lab 01/24/13 1903  01/25/13 0156 01/26/13 0859 01/27/13 0752 01/27/13 1900 01/28/13 0705 01/29/13 0628  WBC 17.5*  --  17.0* 25.9* 21.1*  --  19.2* 15.4*  NEUTROABS 14.9*  --   --   --   --   --   --   --   HGB 8.4*  < > 7.8* 7.7* 6.7* 7.3* 7.5* 7.4*  HCT 27.3*  < > 25.1* 25.0* 21.3* 23.1* 23.2* 22.8*  MCV 94.1  --  94.0 92.6 91.4  --  88.9 88.4  PLT 279  --  257 240 214  --  222 212  < > = values in this interval not displayed. Cardiac Enzymes: No results found for this basename: CKTOTAL, CKMB, CKMBINDEX, TROPONINI,  in the last 168 hours BNP: BNP (last 3 results)  Recent Labs  05/22/12 0615 08/16/12 2049 11/10/12 1724  PROBNP 15652.0* 26806.0* 33908.0*   CBG:  Recent Labs Lab 01/28/13 0627 01/28/13 1144 01/28/13 1655 01/28/13 2132 01/29/13 0616  GLUCAP 104* 129* 197* 112* 98    Additional labs:  ABG: 01/25/13: PH 7.152, PCO2 36, PO2 82, bicarbonate 12 and oxygen saturation 94%.  BMP on 01/24/13: Sodium 137, potassium 6.8, chloride 112, bicarbonate 12, BUN 86, creatinine 4.32, calcium 9 and glucose 150.  Magnesium on 3/1:2.3  TSH: 6.126.   SignedVernell Leep  [facility] 01/29/2013, 12:18 PM

## 2013-02-03 LAB — CULTURE, BLOOD (ROUTINE X 2): Culture: NO GROWTH

## 2013-02-13 ENCOUNTER — Other Ambulatory Visit (HOSPITAL_COMMUNITY): Payer: Self-pay | Admitting: *Deleted

## 2013-02-14 ENCOUNTER — Encounter (HOSPITAL_COMMUNITY)
Admission: RE | Admit: 2013-02-14 | Discharge: 2013-02-14 | Disposition: A | Discharge status: Home / Self Care | Payer: BC Managed Care – PPO | Source: Ambulatory Visit | Attending: Nephrology | Admitting: Nephrology

## 2013-02-14 ENCOUNTER — Encounter (HOSPITAL_COMMUNITY): Payer: Self-pay

## 2013-02-14 ENCOUNTER — Emergency Department (HOSPITAL_COMMUNITY)
Admission: EM | Admit: 2013-02-14 | Discharge: 2013-02-14 | Disposition: A | Discharge status: Home / Self Care | Payer: BC Managed Care – PPO | Attending: Emergency Medicine | Admitting: Emergency Medicine

## 2013-02-14 VITALS — BP 151/58 | HR 71 | Temp 98.7°F | Resp 18 | Ht 66.5 in | Wt 288.0 lb

## 2013-02-14 DIAGNOSIS — Z79899 Other long term (current) drug therapy: Secondary | ICD-10-CM | POA: Insufficient documentation

## 2013-02-14 DIAGNOSIS — E119 Type 2 diabetes mellitus without complications: Secondary | ICD-10-CM | POA: Insufficient documentation

## 2013-02-14 DIAGNOSIS — N183 Chronic kidney disease, stage 3 unspecified: Secondary | ICD-10-CM | POA: Insufficient documentation

## 2013-02-14 DIAGNOSIS — E039 Hypothyroidism, unspecified: Secondary | ICD-10-CM | POA: Insufficient documentation

## 2013-02-14 DIAGNOSIS — I5020 Unspecified systolic (congestive) heart failure: Secondary | ICD-10-CM | POA: Insufficient documentation

## 2013-02-14 DIAGNOSIS — D649 Anemia, unspecified: Secondary | ICD-10-CM

## 2013-02-14 DIAGNOSIS — N189 Chronic kidney disease, unspecified: Secondary | ICD-10-CM

## 2013-02-14 DIAGNOSIS — L02419 Cutaneous abscess of limb, unspecified: Secondary | ICD-10-CM | POA: Insufficient documentation

## 2013-02-14 DIAGNOSIS — K612 Anorectal abscess: Secondary | ICD-10-CM | POA: Insufficient documentation

## 2013-02-14 DIAGNOSIS — Z862 Personal history of diseases of the blood and blood-forming organs and certain disorders involving the immune mechanism: Secondary | ICD-10-CM | POA: Insufficient documentation

## 2013-02-14 DIAGNOSIS — R5381 Other malaise: Secondary | ICD-10-CM | POA: Insufficient documentation

## 2013-02-14 DIAGNOSIS — I129 Hypertensive chronic kidney disease with stage 1 through stage 4 chronic kidney disease, or unspecified chronic kidney disease: Secondary | ICD-10-CM | POA: Insufficient documentation

## 2013-02-14 DIAGNOSIS — R0989 Other specified symptoms and signs involving the circulatory and respiratory systems: Secondary | ICD-10-CM | POA: Insufficient documentation

## 2013-02-14 DIAGNOSIS — N179 Acute kidney failure, unspecified: Secondary | ICD-10-CM

## 2013-02-14 DIAGNOSIS — R0609 Other forms of dyspnea: Secondary | ICD-10-CM | POA: Insufficient documentation

## 2013-02-14 DIAGNOSIS — N739 Female pelvic inflammatory disease, unspecified: Secondary | ICD-10-CM | POA: Insufficient documentation

## 2013-02-14 DIAGNOSIS — Z8639 Personal history of other endocrine, nutritional and metabolic disease: Secondary | ICD-10-CM | POA: Insufficient documentation

## 2013-02-14 LAB — COMPREHENSIVE METABOLIC PANEL
BUN: 55 mg/dL — ABNORMAL HIGH (ref 6–23)
Calcium: 8.9 mg/dL (ref 8.4–10.5)
GFR calc Af Amer: 19 mL/min — ABNORMAL LOW (ref >90)
Glucose, Bld: 116 mg/dL — ABNORMAL HIGH (ref 70–99)
Sodium: 137 mEq/L (ref 135–145)
Total Protein: 8 g/dL (ref 6.0–8.3)

## 2013-02-14 LAB — CBC WITH DIFFERENTIAL/PLATELET
Eosinophils Absolute: 0.8 10*3/uL — ABNORMAL HIGH (ref 0.0–0.7)
Eosinophils Relative: 4 % (ref 0–5)
Lymphs Abs: 1.4 10*3/uL (ref 0.7–4.0)
MCH: 28.6 pg (ref 26.0–34.0)
MCV: 93.7 fL (ref 78.0–100.0)
Monocytes Relative: 4 % (ref 3–12)
Platelets: 269 10*3/uL (ref 150–400)
RBC: 2.55 MIL/uL — ABNORMAL LOW (ref 3.87–5.11)

## 2013-02-14 LAB — TYPE AND SCREEN
ABO/RH(D): B POS
Antibody Screen: NEGATIVE

## 2013-02-14 LAB — PROTIME-INR
INR: 1.37 (ref 0.00–1.49)
Prothrombin Time: 16.5 seconds — ABNORMAL HIGH (ref 11.6–15.2)

## 2013-02-14 MED ORDER — DARBEPOETIN ALFA-POLYSORBATE 200 MCG/0.4ML IJ SOLN: 200.0000 ug | INTRAMUSCULAR | Status: DC

## 2013-02-14 MED ORDER — FERUMOXYTOL INJECTION 510 MG/17 ML: 510.0000 mg | INTRAVENOUS | Status: DC

## 2013-02-14 MED ORDER — FERUMOXYTOL INJECTION 510 MG/17 ML
INTRAVENOUS | Status: AC
  Administered 2013-02-14: 510 mg via INTRAVENOUS
  Filled 2013-02-14: qty 17

## 2013-02-14 MED ORDER — SODIUM CHLORIDE 0.9 % IV SOLN
INTRAVENOUS | Status: DC
  Administered 2013-02-14: 250 mL via INTRAVENOUS

## 2013-02-14 MED ORDER — DARBEPOETIN ALFA-POLYSORBATE 200 MCG/0.4ML IJ SOLN
INTRAMUSCULAR | Status: AC
  Filled 2013-02-14: qty 0.4

## 2013-02-14 NOTE — ED Notes (Signed)
Pt unhooked from monitor and getting dressed for discharge

## 2013-02-14 NOTE — Progress Notes (Addendum)
Hemoglobin 6.5 phoned to [place], [place] at [facility].  She will advise [name] and phone back with further instructions Crystal phoned back with instructions from [name] to take patient to the ED.  Stated to hold the aranesp injection. Patient and her mother taken to triage in the ED via wheel chair and report given to [facility], [place] who was with patient

## 2013-02-14 NOTE — ED Notes (Signed)
Pt refused second piv-stated will wait until blood ordered before accepting iv.

## 2013-02-14 NOTE — ED Provider Notes (Signed)
History     CSN: [id number]  Arrival date & time 02/14/13  1217   First MD Initiated Contact with Patient 02/14/13 1321      Chief Complaint  Patient presents with  . Abnormal Lab    (Consider location/radiation/quality/duration/timing/severity/associated sxs/prior treatment) HPI Comments: Patient is a 52 year old female with a past medical history significant for congestive heart failure, chronic kidney disease stage III, and suppurative hidradenitis who presents from upstairs with a hemoglobin level of 6. Patient arrives at [facility] to have hemoglobin shot and had blood drawn to obtain hemoglobin levels; Dr. Florene Glen was consulted who advised that the patient be brought to the emergency department for further evaluation. Patient believes anemia is due to her hidradenitis as she has multiple boils and abscesses in her genital, perianal, and inner thigh region that drain copious amounts of blood and pus like drainage. Patient changes sanitary pads, which line all sides of her underwear, multiple times per day. She also needs to be cleaned by her mother very frequently to keep the area clean and dry. Patient admits to recent symptoms of dyspnea on exertion. She denies fevers, vision changes, chest pain, shortness of breath while at rest, nausea, vomiting, diarrhea, hemoptysis, melena, hematochezia, urinary symptoms, or numbness or tingling in her extremities.  The patient has history of 2 recent hospitalizations - 1) sepsis followed by cardiac arrest in December 2013 and 2) acute on chronic renal failure due to chronic antibiotic use in February 2014; both admissions secondary to the patient's suppurative hidradenitis. Patient is emotional and tearful stating "every time I come to the hospital they admit me and I don't want to be admitted".   Past Medical History  Diagnosis Date  . Systolic congestive heart failure   . SOB (shortness of breath)   . HTN (hypertension)   . DM type 2 (diabetes  mellitus, type 2)   . Chronic kidney disease (CKD), stage III (moderate)   . Iron deficiency anemia   . Morbid obesity   . Hyperkalemia   . Hypothyroidism   . CHF (congestive heart failure)     Past Surgical History  Procedure Laterality Date  . Portacath placement    . Cystoscopy w/ ureteral stent placement  05/28/2012    Procedure: CYSTOSCOPY WITH RETROGRADE PYELOGRAM/URETERAL STENT PLACEMENT;  Surgeon: Reece Packer, MD;  Location: [facility];  Service: Urology;  Laterality: Left;  . Cystoscopy w/ retrogrades  11/16/2012    Procedure: CYSTOSCOPY WITH RETROGRADE PYELOGRAM;  Surgeon: Reece Packer, MD;  Location: [facility];  Service: Urology;  Laterality: Bilateral;  CYSTOSCOPY,BILATERAL RETROGRADE PYELOGRAM/ REMOVAL LEFT URETERAL STENT/ FULGERATION BLADDER MUCOSA/ INSERTION RIGHT URETERAL STENT  . Port-a-cath removal      Family History  Problem Relation Age of Onset  . Coronary artery disease Mother   . Hypertension Mother   . Diabetes type II Mother   . Malignant hyperthermia Mother   . Coronary artery disease Father   . Hypertension Father   . Malignant hyperthermia Father   . Cancer Maternal Grandfather     ? Type  . Kidney failure Maternal Grandmother     History  Substance Use Topics  . Smoking status: Never Smoker   . Smokeless tobacco: Never Used  . Alcohol Use: No    OB History   Grav Para Term Preterm Abortions TAB SAB Ect Mult Living                  Review of Systems  Constitutional: Positive for  fatigue. Negative for fever and chills.  HENT: Negative for neck pain and neck stiffness.   Eyes: Negative for visual disturbance.  Respiratory: Negative for cough, chest tightness, shortness of breath and wheezing.        + Dyspnea on exertion  Cardiovascular: Negative for chest pain.  Gastrointestinal: Negative for vomiting, abdominal pain, diarrhea and blood in stool.  Genitourinary: Negative for dysuria and hematuria.  Skin: Negative for color change.        + multiple abscesses and boils  Neurological: Negative for syncope, weakness, light-headedness and numbness.  All other systems reviewed and are negative.    Allergies  Amoxicillin-pot clavulanate; Rosiglitazone maleate; and Amoxicillin  Home Medications   Current Outpatient Rx  Name  Route  Sig  Dispense  Refill  . calcitRIOL (ROCALTROL) 0.25 MCG capsule   Oral   Take 1 capsule (0.25 mcg total) by mouth daily.   30 capsule   0   . calcium carbonate (OS-CAL - DOSED IN MG OF ELEMENTAL CALCIUM) 1250 MG tablet   Oral   Take 2 tablets (1,000 mg of elemental calcium total) by mouth daily.   60 tablet   0   . carvedilol (COREG) 25 MG tablet   Oral   Take 1 tablet (25 mg total) by mouth 2 (two) times daily with a meal.   60 tablet   0   . DULoxetine (CYMBALTA) 60 MG capsule   Oral   Take 1 capsule (60 mg total) by mouth daily.   30 capsule   0   . feeding supplement (RESOURCE BREEZE) LIQD   Oral   Take 1 Container by mouth 2 (two) times daily between meals.   30 Container   1   . ferrous sulfate 325 (65 FE) MG tablet   Oral   Take 1 tablet (325 mg total) by mouth 2 (two) times daily.   60 tablet   0   . folic acid (FOLVITE) 1 MG tablet   Oral   Take 1 tablet (1 mg total) by mouth daily.   30 tablet   0   . furosemide (LASIX) 80 MG tablet   Oral   Take 80 mg by mouth every other day.         . isosorbide mononitrate (IMDUR) 30 MG 24 hr tablet   Oral   Take 1 tablet (30 mg total) by mouth daily.   30 tablet   0   . levothyroxine (SYNTHROID, LEVOTHROID) 25 MCG tablet   Oral   Take 1 tablet (25 mcg total) by mouth every morning. Take on an empty stomach   30 tablet   0   . magnesium oxide (MAG-OX) 400 MG tablet   Oral   Take 400 mg by mouth 2 (two) times daily.         Marland Kitchen oxyCODONE-acetaminophen (PERCOCET/ROXICET) 5-325 MG per tablet   Oral   Take 1 tablet by mouth every 6 (six) hours as needed for pain.         . pravastatin (PRAVACHOL)  40 MG tablet   Oral   Take 1 tablet (40 mg total) by mouth daily.   30 tablet   0   . saccharomyces boulardii (FLORASTOR) 250 MG capsule   Oral   Take 1 capsule (250 mg total) by mouth 2 (two) times daily.   30 capsule   0   . sodium bicarbonate 650 MG tablet   Oral   Take 1 tablet (650 mg total)  by mouth 2 (two) times daily.   60 tablet   0   . sodium hypochlorite (DAKIN'S 1/4 STRENGTH) 0.125 % SOLN   Irrigation   Irrigate with 1 application as directed daily as needed. Apply to wound with dressing changes         . terbinafine (LAMISIL) 1 % cream   Topical   Apply 1 application topically daily. Applies under stomach         . traMADol (ULTRAM) 50 MG tablet   Oral   Take 1 tablet (50 mg total) by mouth every 12 (twelve) hours.   60 tablet   0   . vitamin C (ASCORBIC ACID) 500 MG tablet   Oral   Take 500 mg by mouth daily.         Marland Kitchen zinc sulfate 220 MG capsule   Oral   Take 220 mg by mouth daily.         . cyclobenzaprine (FLEXERIL) 10 MG tablet   Oral   Take 1 tablet (10 mg total) by mouth 3 (three) times daily as needed for muscle spasms. For Spasms   30 tablet   0     BP 127/61  Pulse 71  Temp(Src) 98.2 F (36.8 C) (Oral)  Resp 17  SpO2 100%  LMP 12/24/2012  Physical Exam  Nursing note and vitals reviewed. Constitutional: She is oriented to person, place, and time.  Morbidly obese. Nontoxic appearing and in no acute distress.  HENT:  Head: Normocephalic and atraumatic.  Right Ear: External ear normal.  Left Ear: External ear normal.  Mouth/Throat: Oropharynx is clear and moist. No oropharyngeal exudate.  Eyes: Conjunctivae are normal. Pupils are equal, round, and reactive to light. No scleral icterus.  Neck: Normal range of motion. Neck supple.  Cardiovascular: Normal rate, regular rhythm and normal heart sounds.   Pulmonary/Chest: Effort normal and breath sounds normal. No respiratory distress. She has no wheezes. She has no rales.   Abdominal: Soft. Bowel sounds are normal. She exhibits no distension. There is no tenderness. There is no rebound and no guarding.  Genitourinary: Rectal exam shows no external hemorrhoid, no internal hemorrhoid, no fissure and anal tone normal.  Multiple abscesses in the perianal area draining puslike discharge mixed with blood. Stool appreciated on physical exam is brown in color. If hemoccult positive, this is most likely due to her abscess drainage in this area; low suspicion for gastrointestinal bleeding  Musculoskeletal: Normal range of motion. She exhibits no edema.  Lymphadenopathy:    She has no cervical adenopathy.  Neurological: She is alert and oriented to person, place, and time.  Skin:   Patient with multiple draining abscesses in her perianal, inner thigh, and genital regions; copious amounts of blood drainage  Psychiatric: She has a normal mood and affect. Her behavior is normal.    ED Course  Procedures (including critical care time)  Labs Reviewed  CBC WITH DIFFERENTIAL - Abnormal; Notable for the following:    WBC 17.5 (*)    RBC 2.55 (*)    Hemoglobin 7.3 (*)    HCT 23.9 (*)    RDW 16.4 (*)    Neutrophils Relative 83 (*)    Neutro Abs 14.5 (*)    Lymphocytes Relative 8 (*)    Eosinophils Absolute 0.8 (*)    All other components within normal limits  COMPREHENSIVE METABOLIC PANEL - Abnormal; Notable for the following:    CO2 17 (*)    Glucose, Bld 116 (*)  BUN 55 (*)    Creatinine, Ser 3.03 (*)    Albumin 2.1 (*)    Alkaline Phosphatase 152 (*)    Total Bilirubin 0.2 (*)    GFR calc non Af Amer 17 (*)    GFR calc Af Amer 19 (*)    All other components within normal limits  PROTIME-INR - Abnormal; Notable for the following:    Prothrombin Time 16.5 (*)    All other components within normal limits  OCCULT BLOOD, POC DEVICE - Abnormal; Notable for the following:    Fecal Occult Bld POSITIVE (*)    All other components within normal limits  OCCULT BLOOD  X 1 CARD TO LAB, STOOL  TYPE AND SCREEN   No results found.   1. Anemia   2. Chronic kidney disease      MDM  Patient sent to ED after her hemoglobin level was evaluated and found to be 6.5. Patient's labs rechecked in the emergency department today where her hemoglobin was found to be stable at 7.3. Patient's other labs consistent with prior workups and kidney function is slightly improved from prior on 01/29/2013. Patient states she is, overall, asymptomatic with no medical complaints and wants to go home. She has remained well and non-toxic appearing with stable VS. Patient in NAD with stable O2 sats on RA. Patient will be d/c with PCP follow up and follow up with Dr. Florene Glen who was consulted prior to patient arriving in the ED. Patient states comfort and understanding with this d/c plan with no unaddressed concerns. Patient work up and ED management discussed with Dr. Maryan Rued who is in agreement.  Filed Vitals:   02/14/13 1241 02/14/13 1333 02/14/13 1600  BP: 121/52 130/60 127/61  Pulse: 62 64 71  Temp: 98.7 F (37.1 C) 98.2 F (36.8 C)   TempSrc: Oral Oral   Resp: 24  17  SpO2: 94% 98% 100%           Antonietta Breach, PA-C 02/15/13 1051

## 2013-02-14 NOTE — ED Notes (Signed)
Pt presents from PCP office with report of hemoglobin of 6.  Pt reports fatigue x 1 week.

## 2013-02-14 NOTE — ED Notes (Signed)
o2 sats at 88-placed on 4l [place].

## 2013-02-14 NOTE — ED Notes (Signed)
AIDET performed. 

## 2013-02-15 NOTE — ED Provider Notes (Signed)
Medical screening examination/treatment/procedure(s) were performed by non-physician practitioner and as supervising physician I was immediately available for consultation/collaboration.   Blanchie Dessert, MD 02/15/13 1215

## 2013-03-03 ENCOUNTER — Encounter (HOSPITAL_COMMUNITY)
Admission: RE | Admit: 2013-03-03 | Discharge: 2013-03-03 | Disposition: A | Discharge status: Home / Self Care | Payer: BC Managed Care – PPO | Source: Ambulatory Visit | Attending: Nephrology | Admitting: Nephrology

## 2013-03-03 VITALS — BP 112/54 | HR 66 | Temp 98.2°F | Resp 18 | Ht 66.5 in | Wt 295.0 lb

## 2013-03-03 DIAGNOSIS — D649 Anemia, unspecified: Secondary | ICD-10-CM

## 2013-03-03 DIAGNOSIS — N179 Acute kidney failure, unspecified: Secondary | ICD-10-CM | POA: Insufficient documentation

## 2013-03-03 DIAGNOSIS — N189 Chronic kidney disease, unspecified: Secondary | ICD-10-CM | POA: Insufficient documentation

## 2013-03-03 LAB — POCT HEMOGLOBIN-HEMACUE: Hemoglobin: 6.9 g/dL — CL (ref 12.0–15.0)

## 2013-03-03 LAB — RENAL FUNCTION PANEL
BUN: 48 mg/dL — ABNORMAL HIGH (ref 6–23)
Calcium: 8.7 mg/dL (ref 8.4–10.5)
Glucose, Bld: 152 mg/dL — ABNORMAL HIGH (ref 70–99)
Phosphorus: 3.7 mg/dL (ref 2.3–4.6)

## 2013-03-03 MED ORDER — DARBEPOETIN ALFA-POLYSORBATE 200 MCG/0.4ML IJ SOLN
INTRAMUSCULAR | Status: AC
  Filled 2013-03-03: qty 0.4

## 2013-03-03 MED ORDER — FERUMOXYTOL INJECTION 510 MG/17 ML: 510.0000 mg | INTRAVENOUS | Status: DC

## 2013-03-03 MED ORDER — DARBEPOETIN ALFA-POLYSORBATE 200 MCG/0.4ML IJ SOLN
200.0000 ug | INTRAMUSCULAR | Status: DC
  Administered 2013-03-03: 200 ug via SUBCUTANEOUS

## 2013-03-03 MED ORDER — FERUMOXYTOL INJECTION 510 MG/17 ML
INTRAVENOUS | Status: AC
  Administered 2013-03-03: 510 mg via INTRAVENOUS
  Filled 2013-03-03: qty 17

## 2013-03-03 MED ORDER — SODIUM CHLORIDE 0.9 % IV SOLN
INTRAVENOUS | Status: DC
  Administered 2013-03-03: 13:00:00 via INTRAVENOUS

## 2013-03-04 LAB — FERRITIN: Ferritin: 837 ng/mL — ABNORMAL HIGH (ref 10–291)

## 2013-03-04 LAB — IRON AND TIBC: UIBC: 119 ug/dL — ABNORMAL LOW (ref 125–400)

## 2013-03-07 ENCOUNTER — Encounter (HOSPITAL_COMMUNITY): Payer: Self-pay

## 2013-03-07 ENCOUNTER — Inpatient Hospital Stay (HOSPITAL_COMMUNITY)
Admission: EM | Admit: 2013-03-07 | Discharge: 2013-03-08 | DRG: 574 | Disposition: A | Discharge status: Home / Self Care | Payer: BC Managed Care – PPO | Attending: Internal Medicine | Admitting: Internal Medicine

## 2013-03-07 DIAGNOSIS — N200 Calculus of kidney: Secondary | ICD-10-CM

## 2013-03-07 DIAGNOSIS — Z79899 Other long term (current) drug therapy: Secondary | ICD-10-CM

## 2013-03-07 DIAGNOSIS — D500 Iron deficiency anemia secondary to blood loss (chronic): Principal | ICD-10-CM | POA: Diagnosis present

## 2013-03-07 DIAGNOSIS — D72829 Elevated white blood cell count, unspecified: Secondary | ICD-10-CM

## 2013-03-07 DIAGNOSIS — A419 Sepsis, unspecified organism: Secondary | ICD-10-CM

## 2013-03-07 DIAGNOSIS — I129 Hypertensive chronic kidney disease with stage 1 through stage 4 chronic kidney disease, or unspecified chronic kidney disease: Secondary | ICD-10-CM | POA: Diagnosis present

## 2013-03-07 DIAGNOSIS — N1330 Unspecified hydronephrosis: Secondary | ICD-10-CM

## 2013-03-07 DIAGNOSIS — Z8249 Family history of ischemic heart disease and other diseases of the circulatory system: Secondary | ICD-10-CM

## 2013-03-07 DIAGNOSIS — E872 Acidosis, unspecified: Secondary | ICD-10-CM

## 2013-03-07 DIAGNOSIS — B965 Pseudomonas (aeruginosa) (mallei) (pseudomallei) as the cause of diseases classified elsewhere: Secondary | ICD-10-CM

## 2013-03-07 DIAGNOSIS — L88 Pyoderma gangrenosum: Secondary | ICD-10-CM

## 2013-03-07 DIAGNOSIS — Z888 Allergy status to other drugs, medicaments and biological substances status: Secondary | ICD-10-CM

## 2013-03-07 DIAGNOSIS — N179 Acute kidney failure, unspecified: Secondary | ICD-10-CM

## 2013-03-07 DIAGNOSIS — N189 Chronic kidney disease, unspecified: Secondary | ICD-10-CM

## 2013-03-07 DIAGNOSIS — Z872 Personal history of diseases of the skin and subcutaneous tissue: Secondary | ICD-10-CM

## 2013-03-07 DIAGNOSIS — I5020 Unspecified systolic (congestive) heart failure: Secondary | ICD-10-CM

## 2013-03-07 DIAGNOSIS — R739 Hyperglycemia, unspecified: Secondary | ICD-10-CM

## 2013-03-07 DIAGNOSIS — Z6841 Body Mass Index (BMI) 40.0 and over, adult: Secondary | ICD-10-CM

## 2013-03-07 DIAGNOSIS — K219 Gastro-esophageal reflux disease without esophagitis: Secondary | ICD-10-CM

## 2013-03-07 DIAGNOSIS — I10 Essential (primary) hypertension: Secondary | ICD-10-CM | POA: Diagnosis present

## 2013-03-07 DIAGNOSIS — D649 Anemia, unspecified: Secondary | ICD-10-CM | POA: Diagnosis present

## 2013-03-07 DIAGNOSIS — I5040 Unspecified combined systolic (congestive) and diastolic (congestive) heart failure: Secondary | ICD-10-CM | POA: Diagnosis present

## 2013-03-07 DIAGNOSIS — Z881 Allergy status to other antibiotic agents status: Secondary | ICD-10-CM

## 2013-03-07 DIAGNOSIS — E119 Type 2 diabetes mellitus without complications: Secondary | ICD-10-CM

## 2013-03-07 DIAGNOSIS — I509 Heart failure, unspecified: Secondary | ICD-10-CM | POA: Diagnosis present

## 2013-03-07 DIAGNOSIS — J9811 Atelectasis: Secondary | ICD-10-CM

## 2013-03-07 DIAGNOSIS — N039 Chronic nephritic syndrome with unspecified morphologic changes: Secondary | ICD-10-CM | POA: Diagnosis present

## 2013-03-07 DIAGNOSIS — E039 Hypothyroidism, unspecified: Secondary | ICD-10-CM | POA: Diagnosis present

## 2013-03-07 DIAGNOSIS — I5023 Acute on chronic systolic (congestive) heart failure: Secondary | ICD-10-CM

## 2013-03-07 DIAGNOSIS — E042 Nontoxic multinodular goiter: Secondary | ICD-10-CM

## 2013-03-07 DIAGNOSIS — N183 Chronic kidney disease, stage 3 unspecified: Secondary | ICD-10-CM

## 2013-03-07 DIAGNOSIS — L732 Hidradenitis suppurativa: Secondary | ICD-10-CM | POA: Diagnosis present

## 2013-03-07 DIAGNOSIS — E785 Hyperlipidemia, unspecified: Secondary | ICD-10-CM | POA: Diagnosis present

## 2013-03-07 DIAGNOSIS — Z7982 Long term (current) use of aspirin: Secondary | ICD-10-CM

## 2013-03-07 DIAGNOSIS — D631 Anemia in chronic kidney disease: Secondary | ICD-10-CM | POA: Diagnosis present

## 2013-03-07 DIAGNOSIS — F0630 Mood disorder due to known physiological condition, unspecified: Secondary | ICD-10-CM

## 2013-03-07 HISTORY — DX: Hidradenitis suppurativa: L73.2

## 2013-03-07 LAB — CBC WITH DIFFERENTIAL/PLATELET
Basophils Absolute: 0 10*3/uL (ref 0.0–0.1)
Basophils Relative: 0 % (ref 0–1)
Eosinophils Absolute: 0.9 10*3/uL — ABNORMAL HIGH (ref 0.0–0.7)
HCT: 20.5 % — ABNORMAL LOW (ref 36.0–46.0)
MCH: 28.4 pg (ref 26.0–34.0)
MCHC: 30.2 g/dL (ref 30.0–36.0)
Monocytes Absolute: 1 10*3/uL (ref 0.1–1.0)
Neutro Abs: 16 10*3/uL — ABNORMAL HIGH (ref 1.7–7.7)
Neutrophils Relative %: 84 % — ABNORMAL HIGH (ref 43–77)
RDW: 17.2 % — ABNORMAL HIGH (ref 11.5–15.5)

## 2013-03-07 LAB — SAMPLE TO BLOOD BANK

## 2013-03-07 LAB — BASIC METABOLIC PANEL
Chloride: 108 mEq/L (ref 96–112)
Creatinine, Ser: 2.87 mg/dL — ABNORMAL HIGH (ref 0.50–1.10)
GFR calc Af Amer: 21 mL/min — ABNORMAL LOW (ref >90)
GFR calc non Af Amer: 18 mL/min — ABNORMAL LOW (ref >90)

## 2013-03-07 MED ORDER — SACCHAROMYCES BOULARDII 250 MG PO CAPS
250.0000 mg | ORAL_CAPSULE | Freq: Two times a day (BID) | ORAL | Status: DC
  Administered 2013-03-08 (×2): 250 mg via ORAL
  Filled 2013-03-07 (×4): qty 1

## 2013-03-07 MED ORDER — DIPHENHYDRAMINE HCL 50 MG/ML IJ SOLN
25.0000 mg | Freq: Once | INTRAMUSCULAR | Status: AC
  Administered 2013-03-07: 25 mg via INTRAVENOUS
  Filled 2013-03-07 (×3): qty 1

## 2013-03-07 MED ORDER — CARVEDILOL 25 MG PO TABS
25.0000 mg | ORAL_TABLET | Freq: Two times a day (BID) | ORAL | Status: DC
  Administered 2013-03-08: 25 mg via ORAL
  Filled 2013-03-07 (×3): qty 1

## 2013-03-07 MED ORDER — ZINC SULFATE 220 (50 ZN) MG PO CAPS
220.0000 mg | ORAL_CAPSULE | Freq: Every day | ORAL | Status: DC
  Administered 2013-03-08: 220 mg via ORAL
  Filled 2013-03-07: qty 1

## 2013-03-07 MED ORDER — DULOXETINE HCL 60 MG PO CPEP
60.0000 mg | ORAL_CAPSULE | Freq: Every day | ORAL | Status: DC
  Administered 2013-03-08: 60 mg via ORAL
  Filled 2013-03-07: qty 1

## 2013-03-07 MED ORDER — VITAMIN C 500 MG PO TABS
500.0000 mg | ORAL_TABLET | Freq: Every day | ORAL | Status: DC
  Administered 2013-03-08: 500 mg via ORAL
  Filled 2013-03-07: qty 1

## 2013-03-07 MED ORDER — TRAMADOL HCL 50 MG PO TABS
50.0000 mg | ORAL_TABLET | Freq: Two times a day (BID) | ORAL | Status: DC
  Administered 2013-03-07 – 2013-03-08 (×2): 50 mg via ORAL
  Filled 2013-03-07 (×3): qty 1

## 2013-03-07 MED ORDER — OXYCODONE-ACETAMINOPHEN 5-325 MG PO TABS
1.0000 | ORAL_TABLET | Freq: Four times a day (QID) | ORAL | Status: DC | PRN
  Administered 2013-03-08: 1 via ORAL
  Filled 2013-03-07: qty 1

## 2013-03-07 MED ORDER — FUROSEMIDE 10 MG/ML IJ SOLN
40.0000 mg | Freq: Once | INTRAMUSCULAR | Status: AC
  Administered 2013-03-08: 40 mg via INTRAVENOUS
  Filled 2013-03-07: qty 4

## 2013-03-07 MED ORDER — SODIUM CHLORIDE 0.9 % IJ SOLN: 3.0000 mL | INTRAMUSCULAR | Status: DC | PRN

## 2013-03-07 MED ORDER — LEVOTHYROXINE SODIUM 25 MCG PO TABS
25.0000 ug | ORAL_TABLET | Freq: Every day | ORAL | Status: DC
  Administered 2013-03-08: 25 ug via ORAL
  Filled 2013-03-07 (×2): qty 1

## 2013-03-07 MED ORDER — SODIUM CHLORIDE 0.9 % IV SOLN: 250.0000 mL | INTRAVENOUS | Status: DC | PRN

## 2013-03-07 MED ORDER — SIMVASTATIN 20 MG PO TABS
20.0000 mg | ORAL_TABLET | Freq: Every day | ORAL | Status: DC
  Filled 2013-03-07: qty 1

## 2013-03-07 MED ORDER — CALCIUM CARBONATE 1250 (500 CA) MG PO TABS
2.0000 | ORAL_TABLET | Freq: Every day | ORAL | Status: DC
Start: 2013-03-08 — End: 2013-03-08
  Administered 2013-03-08: 1000 mg via ORAL
  Filled 2013-03-07: qty 2

## 2013-03-07 MED ORDER — CYCLOBENZAPRINE HCL 10 MG PO TABS: 10.0000 mg | ORAL_TABLET | Freq: Three times a day (TID) | ORAL | Status: DC | PRN

## 2013-03-07 MED ORDER — MAGNESIUM OXIDE 400 (241.3 MG) MG PO TABS
400.0000 mg | ORAL_TABLET | Freq: Two times a day (BID) | ORAL | Status: DC
  Administered 2013-03-07 – 2013-03-08 (×2): 400 mg via ORAL
  Filled 2013-03-07 (×3): qty 1

## 2013-03-07 MED ORDER — SODIUM CHLORIDE 0.9 % IJ SOLN: 3.0000 mL | Freq: Two times a day (BID) | INTRAMUSCULAR | Status: DC

## 2013-03-07 MED ORDER — TERBINAFINE HCL 1 % EX CREA
1.0000 "application " | TOPICAL_CREAM | Freq: Every day | CUTANEOUS | Status: DC
  Administered 2013-03-08: 1 via TOPICAL
  Filled 2013-03-07: qty 12

## 2013-03-07 MED ORDER — ACETAMINOPHEN 325 MG PO TABS
650.0000 mg | ORAL_TABLET | Freq: Once | ORAL | Status: AC
  Administered 2013-03-07: 650 mg via ORAL
  Filled 2013-03-07: qty 2

## 2013-03-07 MED ORDER — SODIUM BICARBONATE 650 MG PO TABS
650.0000 mg | ORAL_TABLET | Freq: Two times a day (BID) | ORAL | Status: DC
  Administered 2013-03-07 – 2013-03-08 (×2): 650 mg via ORAL
  Filled 2013-03-07 (×3): qty 1

## 2013-03-07 MED ORDER — FERROUS SULFATE 325 (65 FE) MG PO TABS
325.0000 mg | ORAL_TABLET | Freq: Two times a day (BID) | ORAL | Status: DC
  Administered 2013-03-07 – 2013-03-08 (×2): 325 mg via ORAL
  Filled 2013-03-07 (×3): qty 1

## 2013-03-07 MED ORDER — ALBUTEROL SULFATE (5 MG/ML) 0.5% IN NEBU: 2.5000 mg | INHALATION_SOLUTION | RESPIRATORY_TRACT | Status: DC | PRN

## 2013-03-07 MED ORDER — FUROSEMIDE 80 MG PO TABS: 80.0000 mg | ORAL_TABLET | ORAL | Status: DC

## 2013-03-07 MED ORDER — CALCITRIOL 0.25 MCG PO CAPS
0.2500 ug | ORAL_CAPSULE | Freq: Every day | ORAL | Status: DC
  Administered 2013-03-08: 0.25 ug via ORAL
  Filled 2013-03-07: qty 1

## 2013-03-07 MED ORDER — ISOSORBIDE MONONITRATE ER 30 MG PO TB24
30.0000 mg | ORAL_TABLET | Freq: Every day | ORAL | Status: DC
  Administered 2013-03-08: 30 mg via ORAL
  Filled 2013-03-07: qty 1

## 2013-03-07 MED ORDER — FOLIC ACID 1 MG PO TABS
1.0000 mg | ORAL_TABLET | Freq: Every day | ORAL | Status: DC
  Administered 2013-03-08: 1 mg via ORAL
  Filled 2013-03-07: qty 1

## 2013-03-07 MED ORDER — ASPIRIN EC 81 MG PO TBEC
81.0000 mg | DELAYED_RELEASE_TABLET | Freq: Every day | ORAL | Status: DC
  Administered 2013-03-08: 81 mg via ORAL
  Filled 2013-03-07: qty 1

## 2013-03-07 NOTE — H&P (Signed)
PATIENT DETAILS Name: [name] Age: 52 y.o. Sex: female Date of Birth: [birth year] Admit Date: 03/07/2013 [id number] S, MD   CHIEF COMPLAINT:  Sent by her nephrologist-for severe anemia  HPI: Patient is a 52 year old African American female with a past medical history of chronic hydradenitis off her pelvic/perineal area, stage IV chronic kidney disease, chronic anemia who recently started on Aranesp and IV iron injections approximately 2 weeks ago, dyslipidemia, diastolic heart failure who was sent from her primary nephrologist's office after routine blood work showed a hemoglobin of 6.4. She during my evaluation, denied any, fatigability or tiredness. She claims that her stools are brown in color, she has  chronic oozing of blood from her pelvic/perineal area from Chronic hydradenitis, since it always bleeds, she is not sure whether she still has her menstrual periods. She otherwise denies any fever or chest pain. She has a nausea vomiting and diarrhea. I was subsequently asked to admit this patient as observation for blood transfusion.   ALLERGIES:   Allergies  Allergen Reactions  . Amoxicillin-Pot Clavulanate Diarrhea  . Rosiglitazone Maleate     REACTION: swelling  . Amoxicillin Rash    Tolerated Zosyn 01/2013.  [name]    PAST MEDICAL HISTORY: Past Medical History  Diagnosis Date  . Systolic congestive heart failure   . SOB (shortness of breath)   . HTN (hypertension)   . DM type 2 (diabetes mellitus, type 2)   . Chronic kidney disease (CKD), stage III (moderate)   . Iron deficiency anemia   . Morbid obesity   . Hyperkalemia   . Hypothyroidism   . CHF (congestive heart failure)   . Hydradenitis     PAST SURGICAL HISTORY: Past Surgical History  Procedure Laterality Date  . Portacath placement    . Cystoscopy w/ ureteral stent placement  05/28/2012    Procedure: CYSTOSCOPY WITH RETROGRADE PYELOGRAM/URETERAL STENT PLACEMENT;  Surgeon: Reece Packer, MD;   Location: [facility];  Service: Urology;  Laterality: Left;  . Cystoscopy w/ retrogrades  11/16/2012    Procedure: CYSTOSCOPY WITH RETROGRADE PYELOGRAM;  Surgeon: Reece Packer, MD;  Location: [facility];  Service: Urology;  Laterality: Bilateral;  CYSTOSCOPY,BILATERAL RETROGRADE PYELOGRAM/ REMOVAL LEFT URETERAL STENT/ FULGERATION BLADDER MUCOSA/ INSERTION RIGHT URETERAL STENT  . Port-a-cath removal      MEDICATIONS AT HOME: Prior to Admission medications   Medication Sig Start Date End Date Taking? Authorizing Provider  aspirin EC 81 MG tablet Take 81 mg by mouth daily.   Yes Historical Provider, MD  calcitRIOL (ROCALTROL) 0.25 MCG capsule Take 1 capsule (0.25 mcg total) by mouth daily. 12/03/12  Yes Janece Canterbury, MD  calcium carbonate (OS-CAL - DOSED IN MG OF ELEMENTAL CALCIUM) 1250 MG tablet Take 2 tablets (1,000 mg of elemental calcium total) by mouth daily. 12/03/12  Yes Janece Canterbury, MD  carvedilol (COREG) 25 MG tablet Take 1 tablet (25 mg total) by mouth 2 (two) times daily with a meal. 12/03/12  Yes Janece Canterbury, MD  DULoxetine (CYMBALTA) 60 MG capsule Take 1 capsule (60 mg total) by mouth daily. 12/03/12  Yes Janece Canterbury, MD  ferrous sulfate 325 (65 FE) MG tablet Take 1 tablet (325 mg total) by mouth 2 (two) times daily. 12/03/12  Yes Janece Canterbury, MD  folic acid (FOLVITE) 1 MG tablet Take 1 tablet (1 mg total) by mouth daily. 12/03/12  Yes Janece Canterbury, MD  furosemide (LASIX) 80 MG tablet Take 80 mg by mouth every other day.   Yes Historical Provider, MD  isosorbide mononitrate (IMDUR) 30 MG 24 hr tablet Take 1 tablet (30 mg total) by mouth daily. 12/03/12  Yes Janece Canterbury, MD  levothyroxine (SYNTHROID, LEVOTHROID) 25 MCG tablet Take 1 tablet (25 mcg total) by mouth every morning. Take on an empty stomach 12/03/12  Yes Janece Canterbury, MD  magnesium oxide (MAG-OX) 400 MG tablet Take 400 mg by mouth 2 (two) times daily.   Yes Historical Provider, MD  oxyCODONE-acetaminophen  (PERCOCET/ROXICET) 5-325 MG per tablet Take 1 tablet by mouth every 6 (six) hours as needed for pain. 12/03/12  Yes Janece Canterbury, MD  pravastatin (PRAVACHOL) 40 MG tablet Take 1 tablet (40 mg total) by mouth daily. 12/03/12  Yes Janece Canterbury, MD  saccharomyces boulardii (FLORASTOR) 250 MG capsule Take 1 capsule (250 mg total) by mouth 2 (two) times daily. 08/23/12  Yes Domenic Polite, MD  sodium bicarbonate 650 MG tablet Take 1 tablet (650 mg total) by mouth 2 (two) times daily. 01/29/13  Yes Modena Jansky, MD  sodium hypochlorite (DAKIN'S 1/4 STRENGTH) 0.125 % SOLN Irrigate with 1 application as directed daily as needed. Apply to wound with dressing changes   Yes Historical Provider, MD  terbinafine (LAMISIL) 1 % cream Apply 1 application topically daily. Applies under stomach 12/03/12  Yes Janece Canterbury, MD  traMADol (ULTRAM) 50 MG tablet Take 1 tablet (50 mg total) by mouth every 12 (twelve) hours. 12/03/12  Yes Janece Canterbury, MD  vitamin C (ASCORBIC ACID) 500 MG tablet Take 500 mg by mouth daily.   Yes Historical Provider, MD  zinc sulfate 220 MG capsule Take 220 mg by mouth daily.   Yes Historical Provider, MD  cyclobenzaprine (FLEXERIL) 10 MG tablet Take 1 tablet (10 mg total) by mouth 3 (three) times daily as needed for muscle spasms. For Spasms 12/03/12   Janece Canterbury, MD    FAMILY HISTORY: Family History  Problem Relation Age of Onset  . Coronary artery disease Mother   . Hypertension Mother   . Diabetes type II Mother   . Malignant hyperthermia Mother   . Coronary artery disease Father   . Hypertension Father   . Malignant hyperthermia Father   . Cancer Maternal Grandfather     ? Type  . Kidney failure Maternal Grandmother     SOCIAL HISTORY:  reports that she has never smoked. She has never used smokeless tobacco. She reports that she does not drink alcohol or use illicit drugs.  REVIEW OF SYSTEMS:  Constitutional:   No  weight loss, night sweats,  Fevers, chills,  fatigue.  HEENT:    No headaches, Difficulty swallowing,Tooth/dental problems,Sore throat,  No sneezing, itching, ear ache, nasal congestion, post nasal drip,   Cardio-vascular: No chest pain,  Orthopnea, PND, swelling in lower extremities, anasarca,  dizziness, palpitations  GI:  No heartburn, indigestion, abdominal pain, nausea, vomiting, diarrhea, change in       bowel habits, loss of appetite  Resp: No shortness of breath with exertion or at rest.  No excess mucus, no productive cough, No non-productive cough,  No coughing up of blood.No change in color of mucus.No wheezing.No chest wall deformity  Skin:  no rash or lesions.  GU:  no dysuria, change in color of urine, no urgency or frequency.  No flank pain.  Musculoskeletal: No joint pain or swelling.  No decreased range of motion.  No back pain.  Psych: No change in mood or affect. No depression or anxiety.  No memory loss.   PHYSICAL EXAM: Blood pressure  126/53, pulse 66, temperature 98.3 F (36.8 C), temperature source Oral, resp. rate 14, SpO2 99.00%.  General appearance :Awake, alert, not in any distress. Speech Clear. Not toxic Looking HEENT: Atraumatic and Normocephalic, pupils equally reactive to light and accomodation Neck: supple, no JVD. No cervical lymphadenopathy.  Chest:Good air entry bilaterally, no added sounds  CVS: S1 S2 regular, no murmurs.  Abdomen: Bowel sounds present, Non tender and not distended with no gaurding, rigidity or rebound. Pelvic/perineal area shows chronic oozing wounds from chronic hidradenitis Extremities: B/L Lower Ext shows no edema, both legs are warm to touch Neurology: Awake alert, and oriented X 3, CN II-XII intact, Non focal Skin:No Rash Wounds:N/A  LABS ON ADMISSION:   Recent Labs  03/07/13 1555  NA 138  K 3.9  CL 108  CO2 21  GLUCOSE 156*  BUN 43*  CREATININE 2.87*  CALCIUM 8.5   No results found for this basename: AST, ALT, ALKPHOS, BILITOT, PROT, ALBUMIN,   in the last 72 hours No results found for this basename: LIPASE, AMYLASE,  in the last 72 hours  Recent Labs  03/07/13 1555  WBC 19.1*  NEUTROABS 16.0*  HGB 6.2*  HCT 20.5*  MCV 94.0  PLT 374   No results found for this basename: CKTOTAL, CKMB, CKMBINDEX, TROPONINI,  in the last 72 hours No results found for this basename: DDIMER,  in the last 72 hours No components found with this basename: POCBNP,    RADIOLOGIC STUDIES ON ADMISSION: No results found.  ASSESSMENT AND PLAN: Present on Admission:  . Anemia - Suspect this is secondary to chronic kidney disease and chronic blood loss from her wounds from chronic hidradenitis - Will transfuse 2 units, check post transfusion CBC.  - Will need to touch base with her primary nephrologist group to see if she is due for IV iron or Aranesp shots tomorrow morning.  - I doubt that this is not coming from GI loss  - Patient can be potentially discharged posttransfusion tomorrow morning with close followup with her primary nephrologist.  . Chronic kidney disease (CKD), stage III (moderate) - Close to baseline  - Monitor electrolytes periodically  . Hypertension - Currently stable, continue with preadmission medications   . Hidradenitis suppurativa - Continue with wound care - getting wound care consult  - Apparently has been offered invasive surgery at [facility] the patient refused as her chances of complete cure were unlikely.   . Morbid obesity - Encouraged and counseled regarding the importance of weight loss  Further plan will depend as patient's clinical course evolves and further radiologic and laboratory data become available. Patient will be monitored closely.   DVT Prophylaxis: - SCDs given anemia  Code Status: - Full code  Total time spent for admission equals 45 minutes.  [facility] Pager [phone]  If 7PM-7AM, please contact night-coverage [email or web address] Password  [facility] 03/07/2013, 6:32 PM

## 2013-03-07 NOTE — ED Provider Notes (Signed)
History     CSN: [id number]  Arrival date & time 03/07/13  1443   First MD Initiated Contact with Patient 03/07/13 1521      Chief Complaint  Patient presents with  . Abnormal Lab    (Consider location/radiation/quality/duration/timing/severity/associated sxs/prior treatment) HPI [name] is a 52 y.o. female who presents to ED with complaint of low hemaglobin checked by [facility] this morning. Pt with long standing anemia thought to be due to persistent bleeding from hydradenitis supporativa. Pt is currently receiving iron injections, last injection 4 days ago. Pt denies any increased weakness, dizziness. States bleeding same amount as usual. Followed by Dr. Florene Glen.  Past Medical History  Diagnosis Date  . Systolic congestive heart failure   . SOB (shortness of breath)   . HTN (hypertension)   . DM type 2 (diabetes mellitus, type 2)   . Chronic kidney disease (CKD), stage III (moderate)   . Iron deficiency anemia   . Morbid obesity   . Hyperkalemia   . Hypothyroidism   . CHF (congestive heart failure)   . Hydradenitis     Past Surgical History  Procedure Laterality Date  . Portacath placement    . Cystoscopy w/ ureteral stent placement  05/28/2012    Procedure: CYSTOSCOPY WITH RETROGRADE PYELOGRAM/URETERAL STENT PLACEMENT;  Surgeon: Reece Packer, MD;  Location: [facility];  Service: Urology;  Laterality: Left;  . Cystoscopy w/ retrogrades  11/16/2012    Procedure: CYSTOSCOPY WITH RETROGRADE PYELOGRAM;  Surgeon: Reece Packer, MD;  Location: [facility];  Service: Urology;  Laterality: Bilateral;  CYSTOSCOPY,BILATERAL RETROGRADE PYELOGRAM/ REMOVAL LEFT URETERAL STENT/ FULGERATION BLADDER MUCOSA/ INSERTION RIGHT URETERAL STENT  . Port-a-cath removal      Family History  Problem Relation Age of Onset  . Coronary artery disease Mother   . Hypertension Mother   . Diabetes type II Mother   . Malignant hyperthermia Mother   . Coronary artery disease Father   .  Hypertension Father   . Malignant hyperthermia Father   . Cancer Maternal Grandfather     ? Type  . Kidney failure Maternal Grandmother     History  Substance Use Topics  . Smoking status: Never Smoker   . Smokeless tobacco: Never Used  . Alcohol Use: No    OB History   Grav Para Term Preterm Abortions TAB SAB Ect Mult Living                  Review of Systems  Constitutional: Negative for fever and chills.  HENT: Negative for neck pain and neck stiffness.   Respiratory: Negative.   Cardiovascular: Negative.   Gastrointestinal: Negative.  Negative for blood in stool.  Skin: Positive for wound.  Neurological: Negative for dizziness, weakness, numbness and headaches.  All other systems reviewed and are negative.    Allergies  Amoxicillin-pot clavulanate; Rosiglitazone maleate; and Amoxicillin  Home Medications   Current Outpatient Rx  Name  Route  Sig  Dispense  Refill  . aspirin EC 81 MG tablet   Oral   Take 81 mg by mouth daily.         . calcitRIOL (ROCALTROL) 0.25 MCG capsule   Oral   Take 1 capsule (0.25 mcg total) by mouth daily.   30 capsule   0   . calcium carbonate (OS-CAL - DOSED IN MG OF ELEMENTAL CALCIUM) 1250 MG tablet   Oral   Take 2 tablets (1,000 mg of elemental calcium total) by mouth daily.  60 tablet   0   . carvedilol (COREG) 25 MG tablet   Oral   Take 1 tablet (25 mg total) by mouth 2 (two) times daily with a meal.   60 tablet   0   . DULoxetine (CYMBALTA) 60 MG capsule   Oral   Take 1 capsule (60 mg total) by mouth daily.   30 capsule   0   . ferrous sulfate 325 (65 FE) MG tablet   Oral   Take 1 tablet (325 mg total) by mouth 2 (two) times daily.   60 tablet   0   . folic acid (FOLVITE) 1 MG tablet   Oral   Take 1 tablet (1 mg total) by mouth daily.   30 tablet   0   . furosemide (LASIX) 80 MG tablet   Oral   Take 80 mg by mouth every other day.         . isosorbide mononitrate (IMDUR) 30 MG 24 hr tablet    Oral   Take 1 tablet (30 mg total) by mouth daily.   30 tablet   0   . levothyroxine (SYNTHROID, LEVOTHROID) 25 MCG tablet   Oral   Take 1 tablet (25 mcg total) by mouth every morning. Take on an empty stomach   30 tablet   0   . magnesium oxide (MAG-OX) 400 MG tablet   Oral   Take 400 mg by mouth 2 (two) times daily.         Marland Kitchen oxyCODONE-acetaminophen (PERCOCET/ROXICET) 5-325 MG per tablet   Oral   Take 1 tablet by mouth every 6 (six) hours as needed for pain.         . pravastatin (PRAVACHOL) 40 MG tablet   Oral   Take 1 tablet (40 mg total) by mouth daily.   30 tablet   0   . saccharomyces boulardii (FLORASTOR) 250 MG capsule   Oral   Take 1 capsule (250 mg total) by mouth 2 (two) times daily.   30 capsule   0   . sodium bicarbonate 650 MG tablet   Oral   Take 1 tablet (650 mg total) by mouth 2 (two) times daily.   60 tablet   0   . sodium hypochlorite (DAKIN'S 1/4 STRENGTH) 0.125 % SOLN   Irrigation   Irrigate with 1 application as directed daily as needed. Apply to wound with dressing changes         . terbinafine (LAMISIL) 1 % cream   Topical   Apply 1 application topically daily. Applies under stomach         . traMADol (ULTRAM) 50 MG tablet   Oral   Take 1 tablet (50 mg total) by mouth every 12 (twelve) hours.   60 tablet   0   . vitamin C (ASCORBIC ACID) 500 MG tablet   Oral   Take 500 mg by mouth daily.         Marland Kitchen zinc sulfate 220 MG capsule   Oral   Take 220 mg by mouth daily.         . cyclobenzaprine (FLEXERIL) 10 MG tablet   Oral   Take 1 tablet (10 mg total) by mouth 3 (three) times daily as needed for muscle spasms. For Spasms   30 tablet   0     BP 128/46  Pulse 70  Temp(Src) 98.2 F (36.8 C) (Oral)  Resp 18  SpO2 97%  Physical Exam  Nursing note  and vitals reviewed. Constitutional: She is oriented to person, place, and time. She appears well-developed and well-nourished. No distress.  HENT:  Head: Normocephalic  and atraumatic.  Right Ear: External ear normal.  Eyes: Conjunctivae are normal.  Neck: Neck supple.  Cardiovascular: Normal rate, regular rhythm and normal heart sounds.   Pulmonary/Chest: Effort normal and breath sounds normal. No respiratory distress. She has no wheezes. She has no rales.  Abdominal: Soft. Bowel sounds are normal. She exhibits no distension. There is no tenderness. There is no rebound.  Neurological: She is alert and oriented to person, place, and time.    ED Course  Procedures (including critical care time)  Results for orders placed during the hospital encounter of 03/07/13  CBC WITH DIFFERENTIAL      Result Value Range   WBC 19.1 (*) 4.0 - 10.5 K/uL   RBC 2.18 (*) 3.87 - 5.11 MIL/uL   Hemoglobin 6.2 (*) 12.0 - 15.0 g/dL   HCT 20.5 (*) 36.0 - 46.0 %   MCV 94.0  78.0 - 100.0 fL   MCH 28.4  26.0 - 34.0 pg   MCHC 30.2  30.0 - 36.0 g/dL   RDW 17.2 (*) 11.5 - 15.5 %   Platelets 374  150 - 400 K/uL   Neutrophils Relative 84 (*) 43 - 77 %   Neutro Abs 16.0 (*) 1.7 - 7.7 K/uL   Lymphocytes Relative 6 (*) 12 - 46 %   Lymphs Abs 1.2  0.7 - 4.0 K/uL   Monocytes Relative 5  3 - 12 %   Monocytes Absolute 1.0  0.1 - 1.0 K/uL   Eosinophils Relative 5  0 - 5 %   Eosinophils Absolute 0.9 (*) 0.0 - 0.7 K/uL   Basophils Relative 0  0 - 1 %   Basophils Absolute 0.0  0.0 - 0.1 K/uL  BASIC METABOLIC PANEL      Result Value Range   Sodium 138  135 - 145 mEq/L   Potassium 3.9  3.5 - 5.1 mEq/L   Chloride 108  96 - 112 mEq/L   CO2 21  19 - 32 mEq/L   Glucose, Bld 156 (*) 70 - 99 mg/dL   BUN 43 (*) 6 - 23 mg/dL   Creatinine, Ser 2.87 (*) 0.50 - 1.10 mg/dL   Calcium 8.5  8.4 - 10.5 mg/dL   GFR calc non Af Amer 18 (*) >90 mL/min   GFR calc Af Amer 21 (*) >90 mL/min  SAMPLE TO BLOOD BANK      Result Value Range   Blood Bank Specimen SAMPLE AVAILABLE FOR TESTING     Sample Expiration 03/08/2013    PREPARE RBC (CROSSMATCH)      Result Value Range   Order Confirmation ORDER  PROCESSED BY BLOOD BANK    TYPE AND SCREEN      Result Value Range   ABO/RH(D) B POS     Antibody Screen NEG     Sample Expiration 03/10/2013     Unit Number [id number]     Blood Component Type RED CELLS,LR     Unit division 00     Status of Unit ISSUED     Transfusion Status OK TO TRANSFUSE     Crossmatch Result Compatible     Unit Number [id number]     Blood Component Type RED CELLS,LR     Unit division 00     Status of Unit ALLOCATED     Transfusion Status OK TO TRANSFUSE  Crossmatch Result Compatible     No results found.  5:38 PM Pt's hgb 6.2, discussed with Dr. Lorrene Reid, nephrophlogy, who recommended admission, transfusion. Spoke with [facility], will admit. 2units of PRBCs ordered.     1. Anemia   2. Hyperglycemia   3. Chronic kidney disease (CKD), stage III (moderate)   4. Hidradenitis suppurativa   5. Hypertension       MDM   Pt with chronic anemia, unsure of the cause, possible kidney disease vs her "bleeding adenitis." Pt's hgb lower today than her baseline. Will admit for transfusion.   Spoke with [facility] will admit.   Pt neurovascularly stable.       Renold Genta, PA-C 03/08/13 [phone]

## 2013-03-07 NOTE — ED Notes (Signed)
Pt reports she was sent here by her urologist d/t low Hgb of 6.4. Pt's had her blood drawn today at [facility], pt received a Hgb and Iron shot Monday

## 2013-03-08 DIAGNOSIS — D649 Anemia, unspecified: Secondary | ICD-10-CM

## 2013-03-08 LAB — BASIC METABOLIC PANEL
BUN: 46 mg/dL — ABNORMAL HIGH (ref 6–23)
CO2: 22 mEq/L (ref 19–32)
Calcium: 8.7 mg/dL (ref 8.4–10.5)
Chloride: 108 mEq/L (ref 96–112)
Creatinine, Ser: 2.84 mg/dL — ABNORMAL HIGH (ref 0.50–1.10)

## 2013-03-08 LAB — CBC
HCT: 24 % — ABNORMAL LOW (ref 36.0–46.0)
MCH: 28.4 pg (ref 26.0–34.0)
MCHC: 31.3 g/dL (ref 30.0–36.0)
MCV: 90.9 fL (ref 78.0–100.0)
MCV: 90 fL (ref 78.0–100.0)
Platelets: 371 10*3/uL (ref 150–400)
Platelets: 355 10*3/uL (ref 150–400)
RDW: 19.4 % — ABNORMAL HIGH (ref 11.5–15.5)
RDW: 18.5 % — ABNORMAL HIGH (ref 11.5–15.5)
WBC: 19.4 10*3/uL — ABNORMAL HIGH (ref 4.0–10.5)
WBC: 20.2 10*3/uL — ABNORMAL HIGH (ref 4.0–10.5)

## 2013-03-08 NOTE — ED Provider Notes (Signed)
Medical screening examination/treatment/procedure(s) were performed by non-physician practitioner and as supervising physician I was immediately available for consultation/collaboration.  Threasa Beards, MD 03/08/13 [phone]

## 2013-03-08 NOTE — Discharge Summary (Signed)
Physician Discharge Summary  Patient ID: [name] MRN: [id number] DOB/AGE: [birth year] 52 y.o.  Admit date: 03/07/2013 Discharge date: 03/08/2013  Primary Care Physician:  [name], MD   Discharge Diagnoses:    Active Problems:   Morbid obesity   Chronic kidney disease (CKD), stage III (moderate)   Hidradenitis suppurativa   Anemia   Hypertension      Medication List    TAKE these medications       aspirin EC 81 MG tablet  Take 81 mg by mouth daily.     calcitRIOL 0.25 MCG capsule  Commonly known as:  ROCALTROL  Take 1 capsule (0.25 mcg total) by mouth daily.     calcium carbonate 1250 MG tablet  Commonly known as:  OS-CAL - dosed in mg of elemental calcium  Take 2 tablets (1,000 mg of elemental calcium total) by mouth daily.     carvedilol 25 MG tablet  Commonly known as:  COREG  Take 1 tablet (25 mg total) by mouth 2 (two) times daily with a meal.     cyclobenzaprine 10 MG tablet  Commonly known as:  FLEXERIL  Take 1 tablet (10 mg total) by mouth 3 (three) times daily as needed for muscle spasms. For Spasms     DULoxetine 60 MG capsule  Commonly known as:  CYMBALTA  Take 1 capsule (60 mg total) by mouth daily.     ferrous sulfate 325 (65 FE) MG tablet  Take 1 tablet (325 mg total) by mouth 2 (two) times daily.     folic acid 1 MG tablet  Commonly known as:  FOLVITE  Take 1 tablet (1 mg total) by mouth daily.     furosemide 80 MG tablet  Commonly known as:  LASIX  Take 80 mg by mouth every other day.     isosorbide mononitrate 30 MG 24 hr tablet  Commonly known as:  IMDUR  Take 1 tablet (30 mg total) by mouth daily.     levothyroxine 25 MCG tablet  Commonly known as:  SYNTHROID, LEVOTHROID  Take 1 tablet (25 mcg total) by mouth every morning. Take on an empty stomach     magnesium oxide 400 MG tablet  Commonly known as:  MAG-OX  Take 400 mg by mouth 2 (two) times daily.     oxyCODONE-acetaminophen 5-325 MG per tablet  Commonly known  as:  PERCOCET/ROXICET  Take 1 tablet by mouth every 6 (six) hours as needed for pain.     pravastatin 40 MG tablet  Commonly known as:  PRAVACHOL  Take 1 tablet (40 mg total) by mouth daily.     saccharomyces boulardii 250 MG capsule  Commonly known as:  FLORASTOR  Take 1 capsule (250 mg total) by mouth 2 (two) times daily.     sodium bicarbonate 650 MG tablet  Take 1 tablet (650 mg total) by mouth 2 (two) times daily.     sodium hypochlorite 0.125 % Soln  Commonly known as:  DAKIN'S 1/4 STRENGTH  Irrigate with 1 application as directed daily as needed. Apply to wound with dressing changes     terbinafine 1 % cream  Commonly known as:  LAMISIL  Apply 1 application topically daily. Applies under stomach     traMADol 50 MG tablet  Commonly known as:  ULTRAM  Take 1 tablet (50 mg total) by mouth every 12 (twelve) hours.     vitamin C 500 MG tablet  Commonly known as:  ASCORBIC ACID  Take 500  mg by mouth daily.     zinc sulfate 220 MG capsule  Take 220 mg by mouth daily.         Disposition and Follow-up:  Will be discharged home today in stable and improved condition. Has been advised to followup with her primary care provider in about one week to followup on her hemoglobin.  Consults:  None   Significant Diagnostic Studies:  No results found.  Brief H and P: For complete details please refer to admission H and P, but in brief patient is a 52 year old [place] American female with a past medical history of chronic hydradenitis off her pelvic/perineal area, stage IV chronic kidney disease, chronic anemia who recently started on Aranesp and IV iron injections approximately 2 weeks ago, dyslipidemia, diastolic heart failure who was sent from her primary nephrologist's office after routine blood work showed a hemoglobin of 6.4. She during my evaluation, denied any, fatigability or tiredness. She claims that her stools are brown in color, she has chronic oozing of blood from her  pelvic/perineal area from Chronic hydradenitis, since it always bleeds, she is not sure whether she still has her menstrual periods. She otherwise denies any fever or chest pain. She has a nausea vomiting and diarrhea. I was subsequently asked to admit this patient as observation for blood transfusion.     Hospital Course:  Active Problems:   Morbid obesity   Chronic kidney disease (CKD), stage III (moderate)   Hidradenitis suppurativa   Anemia   Hypertension    Anemia -This is anemia of chronic disease given her chronic kidney disorder on top of chronic blood loss anemia from her hydradenitis suppurativa. -She has been transfused 3 units of PRBCs with a hemoglobin that is increased from 6.4 on admission to 8.4 on discharge. -Has been advised to followup with her primary care physician next week for repeat  hemoglobin.  Rest of chronic medical conditions have been stable this hospitalization.  Time spent on Discharge: Greater than 30 minutes  Signed: Lelon Frohlich [facility] Pager: [phone] 03/08/2013, 12:59 PM

## 2013-03-08 NOTE — Progress Notes (Signed)
Verbalized understanding of discharge instructions.  IV site discontinued.  H/H 8.3/25.1 past 1 unit of blood this morning.  MD aware prior to release.  Spouse provided transportation.

## 2013-03-10 LAB — TYPE AND SCREEN
ABO/RH(D): B POS
Unit division: 0
Unit division: 0
Unit division: 0

## 2013-03-17 ENCOUNTER — Encounter (HOSPITAL_COMMUNITY)
Admission: RE | Admit: 2013-03-17 | Discharge: 2013-03-17 | Disposition: A | Discharge status: Home / Self Care | Payer: BC Managed Care – PPO | Source: Ambulatory Visit | Attending: Nephrology | Admitting: Nephrology

## 2013-03-17 VITALS — BP 121/61 | HR 68 | Temp 98.4°F | Resp 20

## 2013-03-17 DIAGNOSIS — D649 Anemia, unspecified: Secondary | ICD-10-CM

## 2013-03-17 DIAGNOSIS — N189 Chronic kidney disease, unspecified: Secondary | ICD-10-CM

## 2013-03-17 DIAGNOSIS — N179 Acute kidney failure, unspecified: Secondary | ICD-10-CM

## 2013-03-17 MED ORDER — DARBEPOETIN ALFA-POLYSORBATE 200 MCG/0.4ML IJ SOLN
INTRAMUSCULAR | Status: AC
  Administered 2013-03-17: 200 ug via SUBCUTANEOUS
  Filled 2013-03-17: qty 0.4

## 2013-03-17 MED ORDER — DARBEPOETIN ALFA-POLYSORBATE 200 MCG/0.4ML IJ SOLN: 200.0000 ug | INTRAMUSCULAR | Status: DC

## 2013-03-31 ENCOUNTER — Encounter (HOSPITAL_COMMUNITY)
Admission: RE | Admit: 2013-03-31 | Discharge: 2013-03-31 | Disposition: A | Discharge status: Home / Self Care | Payer: BC Managed Care – PPO | Source: Ambulatory Visit | Attending: Nephrology | Admitting: Nephrology

## 2013-03-31 VITALS — BP 134/83 | HR 71 | Temp 98.2°F | Resp 20

## 2013-03-31 DIAGNOSIS — N189 Chronic kidney disease, unspecified: Secondary | ICD-10-CM | POA: Insufficient documentation

## 2013-03-31 DIAGNOSIS — D649 Anemia, unspecified: Secondary | ICD-10-CM | POA: Insufficient documentation

## 2013-03-31 DIAGNOSIS — N179 Acute kidney failure, unspecified: Secondary | ICD-10-CM | POA: Insufficient documentation

## 2013-03-31 LAB — IRON AND TIBC: Iron: 20 ug/dL — ABNORMAL LOW (ref 42–135)

## 2013-03-31 LAB — RENAL FUNCTION PANEL
Albumin: 1.9 g/dL — ABNORMAL LOW (ref 3.5–5.2)
BUN: 42 mg/dL — ABNORMAL HIGH (ref 6–23)
Phosphorus: 3.5 mg/dL (ref 2.3–4.6)
Potassium: 4 mEq/L (ref 3.5–5.1)

## 2013-03-31 LAB — FERRITIN: Ferritin: 649 ng/mL — ABNORMAL HIGH (ref 10–291)

## 2013-03-31 MED ORDER — DARBEPOETIN ALFA-POLYSORBATE 200 MCG/0.4ML IJ SOLN
200.0000 ug | INTRAMUSCULAR | Status: DC
  Administered 2013-03-31: 200 ug via SUBCUTANEOUS

## 2013-03-31 MED ORDER — DARBEPOETIN ALFA-POLYSORBATE 200 MCG/0.4ML IJ SOLN
INTRAMUSCULAR | Status: AC
  Filled 2013-03-31: qty 0.4

## 2013-04-14 ENCOUNTER — Encounter (HOSPITAL_COMMUNITY)
Admission: RE | Admit: 2013-04-14 | Discharge: 2013-04-14 | Disposition: A | Discharge status: Home / Self Care | Payer: BC Managed Care – PPO | Source: Ambulatory Visit | Attending: Nephrology | Admitting: Nephrology

## 2013-04-14 VITALS — BP 168/79 | HR 71 | Temp 98.7°F | Resp 18

## 2013-04-14 DIAGNOSIS — D649 Anemia, unspecified: Secondary | ICD-10-CM

## 2013-04-14 DIAGNOSIS — N189 Chronic kidney disease, unspecified: Secondary | ICD-10-CM

## 2013-04-14 MED ORDER — DARBEPOETIN ALFA-POLYSORBATE 200 MCG/0.4ML IJ SOLN
200.0000 ug | INTRAMUSCULAR | Status: DC
  Administered 2013-04-14: 200 ug via SUBCUTANEOUS

## 2013-04-14 MED ORDER — DARBEPOETIN ALFA-POLYSORBATE 200 MCG/0.4ML IJ SOLN
INTRAMUSCULAR | Status: AC
  Filled 2013-04-14: qty 0.4

## 2013-04-18 ENCOUNTER — Emergency Department (HOSPITAL_COMMUNITY)
Admission: EM | Admit: 2013-04-18 | Discharge: 2013-04-18 | Disposition: A | Discharge status: Home / Self Care | Payer: BC Managed Care – PPO | Attending: Emergency Medicine | Admitting: Emergency Medicine

## 2013-04-18 ENCOUNTER — Encounter (HOSPITAL_COMMUNITY): Payer: Self-pay | Admitting: *Deleted

## 2013-04-18 DIAGNOSIS — Z7982 Long term (current) use of aspirin: Secondary | ICD-10-CM | POA: Insufficient documentation

## 2013-04-18 DIAGNOSIS — Z888 Allergy status to other drugs, medicaments and biological substances status: Secondary | ICD-10-CM | POA: Insufficient documentation

## 2013-04-18 DIAGNOSIS — I129 Hypertensive chronic kidney disease with stage 1 through stage 4 chronic kidney disease, or unspecified chronic kidney disease: Secondary | ICD-10-CM | POA: Insufficient documentation

## 2013-04-18 DIAGNOSIS — L988 Other specified disorders of the skin and subcutaneous tissue: Secondary | ICD-10-CM | POA: Insufficient documentation

## 2013-04-18 DIAGNOSIS — D72829 Elevated white blood cell count, unspecified: Secondary | ICD-10-CM | POA: Insufficient documentation

## 2013-04-18 DIAGNOSIS — Z881 Allergy status to other antibiotic agents status: Secondary | ICD-10-CM | POA: Insufficient documentation

## 2013-04-18 DIAGNOSIS — Z79899 Other long term (current) drug therapy: Secondary | ICD-10-CM | POA: Insufficient documentation

## 2013-04-18 DIAGNOSIS — L732 Hidradenitis suppurativa: Secondary | ICD-10-CM | POA: Insufficient documentation

## 2013-04-18 DIAGNOSIS — N183 Chronic kidney disease, stage 3 unspecified: Secondary | ICD-10-CM | POA: Insufficient documentation

## 2013-04-18 DIAGNOSIS — D649 Anemia, unspecified: Secondary | ICD-10-CM

## 2013-04-18 DIAGNOSIS — I5020 Unspecified systolic (congestive) heart failure: Secondary | ICD-10-CM | POA: Insufficient documentation

## 2013-04-18 DIAGNOSIS — E039 Hypothyroidism, unspecified: Secondary | ICD-10-CM | POA: Insufficient documentation

## 2013-04-18 DIAGNOSIS — E119 Type 2 diabetes mellitus without complications: Secondary | ICD-10-CM | POA: Insufficient documentation

## 2013-04-18 LAB — TYPE AND SCREEN
ABO/RH(D): B POS
Antibody Screen: NEGATIVE

## 2013-04-18 LAB — COMPREHENSIVE METABOLIC PANEL
Albumin: 2 g/dL — ABNORMAL LOW (ref 3.5–5.2)
Alkaline Phosphatase: 108 U/L (ref 39–117)
BUN: 39 mg/dL — ABNORMAL HIGH (ref 6–23)
Creatinine, Ser: 2.79 mg/dL — ABNORMAL HIGH (ref 0.50–1.10)
GFR calc Af Amer: 21 mL/min — ABNORMAL LOW (ref >90)
Glucose, Bld: 154 mg/dL — ABNORMAL HIGH (ref 70–99)
Total Bilirubin: 0.2 mg/dL — ABNORMAL LOW (ref 0.3–1.2)
Total Protein: 8.5 g/dL — ABNORMAL HIGH (ref 6.0–8.3)

## 2013-04-18 LAB — CBC
HCT: 27.6 % — ABNORMAL LOW (ref 36.0–46.0)
Hemoglobin: 8.1 g/dL — ABNORMAL LOW (ref 12.0–15.0)
MCHC: 29.3 g/dL — ABNORMAL LOW (ref 30.0–36.0)
MCV: 92.6 fL (ref 78.0–100.0)
RDW: 17.1 % — ABNORMAL HIGH (ref 11.5–15.5)

## 2013-04-18 MED ORDER — FENTANYL CITRATE 0.05 MG/ML IJ SOLN
100.0000 ug | Freq: Once | INTRAMUSCULAR | Status: AC
  Administered 2013-04-18: 100 ug via INTRAVENOUS
  Filled 2013-04-18: qty 2

## 2013-04-18 NOTE — ED Notes (Signed)
This RN witnessed Dr. Stevie Kern performing rectal exam. EDP unable to obtain accurate sample. Pt tolerated well, c/o pain in buttock and lower back. multiple wounds noted to area.

## 2013-04-18 NOTE — ED Notes (Signed)
This RN unable to obtain lab draws. Phlebotomy notified.

## 2013-04-18 NOTE — ED Notes (Addendum)
Pt reports going to heart dr today for check up and sent here due to hgb of 6.0. Hx of hydradentitis.

## 2013-04-18 NOTE — ED Notes (Signed)
Pt comfortable with d/c and f/u instructions. No prescriptions given.

## 2013-04-18 NOTE — ED Provider Notes (Signed)
History     CSN: [id number]  Arrival date & time 04/18/13  [id number]   First MD Initiated Contact with Patient 04/18/13 2041      Chief Complaint  Patient presents with  . Anemia    (Consider location/radiation/quality/duration/timing/severity/associated sxs/prior treatment) HPI This 52 year old female has chronic anemia with recurrent relapses requiring multiple blood transfusions, she has chronic bloody drainage in her groin and anal region from hidradenitis suppurativa, she is sent to the emergency room for blood transfusion by her cardiologist who obtained a routine hemoglobin today that was 6, patient is no fever no chest pain no shortness breath no abdominal pain no bloody stools, or stools are brown, she is no change in her chronic bloody drainage in her groin and anal region, she is no lightheadedness or syncope, there is no treatment prior to arrival, she feels her baseline generalized weakness and fatigue. Past Medical History  Diagnosis Date  . Systolic congestive heart failure   . SOB (shortness of breath)   . HTN (hypertension)   . DM type 2 (diabetes mellitus, type 2)   . Chronic kidney disease (CKD), stage III (moderate)   . Iron deficiency anemia   . Morbid obesity   . Hyperkalemia   . Hypothyroidism   . CHF (congestive heart failure)   . Hydradenitis     Past Surgical History  Procedure Laterality Date  . Portacath placement    . Cystoscopy w/ ureteral stent placement  05/28/2012    Procedure: CYSTOSCOPY WITH RETROGRADE PYELOGRAM/URETERAL STENT PLACEMENT;  Surgeon: Reece Packer, MD;  Location: [facility];  Service: Urology;  Laterality: Left;  . Cystoscopy w/ retrogrades  11/16/2012    Procedure: CYSTOSCOPY WITH RETROGRADE PYELOGRAM;  Surgeon: Reece Packer, MD;  Location: [facility];  Service: Urology;  Laterality: Bilateral;  CYSTOSCOPY,BILATERAL RETROGRADE PYELOGRAM/ REMOVAL LEFT URETERAL STENT/ FULGERATION BLADDER MUCOSA/ INSERTION RIGHT URETERAL STENT  .  Port-a-cath removal      Family History  Problem Relation Age of Onset  . Coronary artery disease Mother   . Hypertension Mother   . Diabetes type II Mother   . Malignant hyperthermia Mother   . Coronary artery disease Father   . Hypertension Father   . Malignant hyperthermia Father   . Cancer Maternal Grandfather     ? Type  . Kidney failure Maternal Grandmother     History  Substance Use Topics  . Smoking status: Never Smoker   . Smokeless tobacco: Never Used  . Alcohol Use: No    OB History   Grav Para Term Preterm Abortions TAB SAB Ect Mult Living                  Review of Systems 10 Systems reviewed and are negative for acute change except as noted in the HPI. Allergies  Amoxicillin-pot clavulanate; Rosiglitazone maleate; and Amoxicillin  Home Medications   Current Outpatient Rx  Name  Route  Sig  Dispense  Refill  . aspirin EC 81 MG tablet   Oral   Take 81 mg by mouth daily.         . calcitRIOL (ROCALTROL) 0.25 MCG capsule   Oral   Take 1 capsule (0.25 mcg total) by mouth daily.   30 capsule   0   . calcium carbonate (OS-CAL - DOSED IN MG OF ELEMENTAL CALCIUM) 1250 MG tablet   Oral   Take 2 tablets (1,000 mg of elemental calcium total) by mouth daily.   60 tablet   0   .  carvedilol (COREG) 25 MG tablet   Oral   Take 1 tablet (25 mg total) by mouth 2 (two) times daily with a meal.   60 tablet   0   . cyclobenzaprine (FLEXERIL) 10 MG tablet   Oral   Take 1 tablet (10 mg total) by mouth 3 (three) times daily as needed for muscle spasms. For Spasms   30 tablet   0   . DULoxetine (CYMBALTA) 60 MG capsule   Oral   Take 1 capsule (60 mg total) by mouth daily.   30 capsule   0   . ferrous sulfate 325 (65 FE) MG tablet   Oral   Take 1 tablet (325 mg total) by mouth 2 (two) times daily.   60 tablet   0   . folic acid (FOLVITE) 1 MG tablet   Oral   Take 1 tablet (1 mg total) by mouth daily.   30 tablet   0   . furosemide (LASIX) 80  MG tablet   Oral   Take 80 mg by mouth every other day.         . isosorbide mononitrate (IMDUR) 30 MG 24 hr tablet   Oral   Take 1 tablet (30 mg total) by mouth daily.   30 tablet   0   . levothyroxine (SYNTHROID, LEVOTHROID) 25 MCG tablet   Oral   Take 1 tablet (25 mcg total) by mouth every morning. Take on an empty stomach   30 tablet   0   . magnesium oxide (MAG-OX) 400 MG tablet   Oral   Take 400 mg by mouth 2 (two) times daily.         Marland Kitchen oxyCODONE-acetaminophen (PERCOCET/ROXICET) 5-325 MG per tablet   Oral   Take 1 tablet by mouth every 6 (six) hours as needed for pain.         . pravastatin (PRAVACHOL) 40 MG tablet   Oral   Take 1 tablet (40 mg total) by mouth daily.   30 tablet   0   . saccharomyces boulardii (FLORASTOR) 250 MG capsule   Oral   Take 1 capsule (250 mg total) by mouth 2 (two) times daily.   30 capsule   0   . sodium bicarbonate 650 MG tablet   Oral   Take 1 tablet (650 mg total) by mouth 2 (two) times daily.   60 tablet   0   . sodium hypochlorite (DAKIN'S 1/4 STRENGTH) 0.125 % SOLN   Irrigation   Irrigate with 1 application as directed daily as needed. Apply to wound with dressing changes         . terbinafine (LAMISIL) 1 % cream   Topical   Apply 1 application topically daily. Applies under stomach         . traMADol (ULTRAM) 50 MG tablet   Oral   Take 1 tablet (50 mg total) by mouth every 12 (twelve) hours.   60 tablet   0   . vitamin C (ASCORBIC ACID) 500 MG tablet   Oral   Take 500 mg by mouth daily.         Marland Kitchen zinc sulfate 220 MG capsule   Oral   Take 220 mg by mouth daily.           BP 137/54  Pulse 80  Temp(Src) 98.7 F (37.1 C) (Oral)  Resp 28  SpO2 95%  Physical Exam  Nursing note and vitals reviewed. Constitutional:  Awake, alert, nontoxic  appearance. Morbidly obese.  HENT:  Head: Atraumatic.  Eyes: Right eye exhibits no discharge. Left eye exhibits no discharge.  Neck: Neck supple.   Cardiovascular: Normal rate and regular rhythm.   No murmur heard. Pulmonary/Chest: Effort normal and breath sounds normal. No respiratory distress. She has no wheezes. She has no rales. She exhibits no tenderness.  Abdominal: Soft. Bowel sounds are normal. She exhibits no distension. There is no tenderness. There is no rebound and no guarding.  Genitourinary:  Chaperone present, the patient has multiple areas of scant bleeding in her groin and perianal region, no obvious cellulitis noted, rectal examination reveals light brown stool however there is blood in the examination glove from the surrounding draining chronic skin wounds so Hemoccult testing cannot be performed  Musculoskeletal: She exhibits no tenderness.  Baseline ROM, no obvious new focal weakness.  Neurological:  Mental status and motor strength appears baseline for patient and situation.  Skin: No rash noted.  Psychiatric: She has a normal mood and affect.    ED Course  Procedures (including critical care time) The patient's hemoglobin is over 8 it does not appear emergency transfusion is indicated, the patient states she tends to run an elevated white blood cell count due to her chronic hidradenitis and has not had fever confusion new rash or change in her a slight drainage in her groin and rectal area is no vomiting no diarrhea no cough no shortness of breath however since she has the elevated white count lactate will be ordered for screening. 2225  Clinically doubt sepsis lactate unremarkable patient appears stable for discharge.  Patient / Family / Caregiver informed of clinical course, understand medical decision-making process, and agree with plan. Labs Reviewed  CBC - Abnormal; Notable for the following:    WBC 19.1 (*)    RBC 2.98 (*)    Hemoglobin 8.1 (*)    HCT 27.6 (*)    MCHC 29.3 (*)    RDW 17.1 (*)    All other components within normal limits  COMPREHENSIVE METABOLIC PANEL - Abnormal; Notable for the  following:    Glucose, Bld 154 (*)    BUN 39 (*)    Creatinine, Ser 2.79 (*)    Total Protein 8.5 (*)    Albumin 2.0 (*)    Total Bilirubin 0.2 (*)    GFR calc non Af Amer 19 (*)    GFR calc Af Amer 21 (*)    All other components within normal limits  CG4 I-STAT (LACTIC ACID)  TYPE AND SCREEN   No results found.   1. Anemia   2. Leucocytosis       MDM  I doubt any other EMC precluding discharge at this time including, but not necessarily limited to the following:sepsis, need for emergent transfusion.        Babette Relic, MD 04/21/13 [phone]

## 2013-04-18 NOTE — ED Notes (Signed)
Dr. Bednar at bedside. 

## 2013-04-28 ENCOUNTER — Encounter (HOSPITAL_COMMUNITY)
Admission: RE | Admit: 2013-04-28 | Discharge: 2013-04-28 | Disposition: A | Discharge status: Home / Self Care | Payer: BC Managed Care – PPO | Source: Ambulatory Visit | Attending: Nephrology | Admitting: Nephrology

## 2013-04-28 VITALS — BP 148/78 | HR 68 | Resp 22

## 2013-04-28 DIAGNOSIS — D649 Anemia, unspecified: Secondary | ICD-10-CM | POA: Insufficient documentation

## 2013-04-28 DIAGNOSIS — N179 Acute kidney failure, unspecified: Secondary | ICD-10-CM | POA: Insufficient documentation

## 2013-04-28 DIAGNOSIS — N189 Chronic kidney disease, unspecified: Secondary | ICD-10-CM | POA: Insufficient documentation

## 2013-04-28 LAB — RENAL FUNCTION PANEL
CO2: 24 mEq/L (ref 19–32)
Calcium: 8.8 mg/dL (ref 8.4–10.5)
Creatinine, Ser: 2.39 mg/dL — ABNORMAL HIGH (ref 0.50–1.10)
GFR calc Af Amer: 26 mL/min — ABNORMAL LOW (ref >90)
Glucose, Bld: 136 mg/dL — ABNORMAL HIGH (ref 70–99)
Phosphorus: 3.4 mg/dL (ref 2.3–4.6)
Sodium: 140 mEq/L (ref 135–145)

## 2013-04-28 LAB — POCT HEMOGLOBIN-HEMACUE: Hemoglobin: 7.9 g/dL — ABNORMAL LOW (ref 12.0–15.0)

## 2013-04-28 LAB — IRON AND TIBC
Saturation Ratios: 13 % — ABNORMAL LOW (ref 20–55)
UIBC: 133 ug/dL (ref 125–400)

## 2013-04-28 MED ORDER — DARBEPOETIN ALFA-POLYSORBATE 200 MCG/0.4ML IJ SOLN
INTRAMUSCULAR | Status: AC
  Administered 2013-04-28: 200 ug via SUBCUTANEOUS
  Filled 2013-04-28: qty 0.4

## 2013-04-28 MED ORDER — DARBEPOETIN ALFA-POLYSORBATE 200 MCG/0.4ML IJ SOLN: 200.0000 ug | INTRAMUSCULAR | Status: DC

## 2013-04-28 NOTE — Progress Notes (Signed)
hemocue today 7.9.  I called [facility] and reported result to crystal.  Pt denies any SOB and states she doesn't feel bad.  Orders received to increase the frequency of pt's visit to weekly and they will send her stool cards in the mail.  I made the pt and family aware of the above and they verbalized understanding.

## 2013-04-29 LAB — FERRITIN: Ferritin: 616 ng/mL — ABNORMAL HIGH (ref 10–291)

## 2013-05-02 ENCOUNTER — Other Ambulatory Visit (HOSPITAL_COMMUNITY): Payer: Self-pay | Admitting: *Deleted

## 2013-05-05 ENCOUNTER — Encounter (HOSPITAL_COMMUNITY)
Admission: RE | Admit: 2013-05-05 | Discharge: 2013-05-05 | Disposition: A | Discharge status: Home / Self Care | Payer: BC Managed Care – PPO | Source: Ambulatory Visit | Attending: Nephrology | Admitting: Nephrology

## 2013-05-05 VITALS — BP 126/82 | HR 79 | Temp 98.0°F

## 2013-05-05 DIAGNOSIS — D649 Anemia, unspecified: Secondary | ICD-10-CM

## 2013-05-05 DIAGNOSIS — N179 Acute kidney failure, unspecified: Secondary | ICD-10-CM

## 2013-05-05 MED ORDER — DARBEPOETIN ALFA-POLYSORBATE 200 MCG/0.4ML IJ SOLN
INTRAMUSCULAR | Status: AC
  Filled 2013-05-05: qty 0.4

## 2013-05-05 MED ORDER — DARBEPOETIN ALFA-POLYSORBATE 200 MCG/0.4ML IJ SOLN
200.0000 ug | INTRAMUSCULAR | Status: DC
  Administered 2013-05-05: 200 ug via SUBCUTANEOUS

## 2013-05-12 ENCOUNTER — Encounter (HOSPITAL_COMMUNITY)
Admission: RE | Admit: 2013-05-12 | Discharge: 2013-05-12 | Disposition: A | Discharge status: Home / Self Care | Payer: BC Managed Care – PPO | Source: Ambulatory Visit | Attending: Nephrology | Admitting: Nephrology

## 2013-05-12 VITALS — BP 159/77 | HR 77 | Resp 20

## 2013-05-12 DIAGNOSIS — N179 Acute kidney failure, unspecified: Secondary | ICD-10-CM

## 2013-05-12 DIAGNOSIS — D649 Anemia, unspecified: Secondary | ICD-10-CM

## 2013-05-12 MED ORDER — DARBEPOETIN ALFA-POLYSORBATE 200 MCG/0.4ML IJ SOLN
INTRAMUSCULAR | Status: AC
  Administered 2013-05-12: 200 ug via SUBCUTANEOUS
  Filled 2013-05-12: qty 0.4

## 2013-05-12 MED ORDER — DARBEPOETIN ALFA-POLYSORBATE 200 MCG/0.4ML IJ SOLN: 200.0000 ug | INTRAMUSCULAR | Status: DC

## 2013-05-14 ENCOUNTER — Other Ambulatory Visit: Payer: Self-pay | Admitting: Urology

## 2013-05-16 ENCOUNTER — Other Ambulatory Visit (HOSPITAL_COMMUNITY): Payer: Self-pay | Admitting: *Deleted

## 2013-05-19 ENCOUNTER — Encounter (HOSPITAL_COMMUNITY)
Admission: RE | Admit: 2013-05-19 | Discharge: 2013-05-19 | Disposition: A | Discharge status: Home / Self Care | Payer: BC Managed Care – PPO | Source: Ambulatory Visit | Attending: Nephrology | Admitting: Nephrology

## 2013-05-19 VITALS — BP 148/83 | HR 70 | Temp 98.7°F | Resp 20 | Ht 66.5 in | Wt 313.0 lb

## 2013-05-19 DIAGNOSIS — D649 Anemia, unspecified: Secondary | ICD-10-CM

## 2013-05-19 DIAGNOSIS — N189 Chronic kidney disease, unspecified: Secondary | ICD-10-CM

## 2013-05-19 MED ORDER — FERUMOXYTOL INJECTION 510 MG/17 ML
INTRAVENOUS | Status: AC
  Filled 2013-05-19: qty 17

## 2013-05-19 MED ORDER — DARBEPOETIN ALFA-POLYSORBATE 200 MCG/0.4ML IJ SOLN: 200.0000 ug | INTRAMUSCULAR | Status: DC

## 2013-05-19 MED ORDER — FERUMOXYTOL INJECTION 510 MG/17 ML
510.0000 mg | INTRAVENOUS | Status: DC
  Administered 2013-05-19: 510 mg via INTRAVENOUS

## 2013-05-19 MED ORDER — SODIUM CHLORIDE 0.9 % IV SOLN
INTRAVENOUS | Status: DC
  Administered 2013-05-19: 13:00:00 via INTRAVENOUS

## 2013-05-19 MED ORDER — DARBEPOETIN ALFA-POLYSORBATE 200 MCG/0.4ML IJ SOLN
INTRAMUSCULAR | Status: AC
  Administered 2013-05-19: 200 ug via SUBCUTANEOUS
  Filled 2013-05-19: qty 0.4

## 2013-05-26 ENCOUNTER — Encounter (HOSPITAL_COMMUNITY)
Admission: RE | Admit: 2013-05-26 | Discharge: 2013-05-26 | Disposition: A | Discharge status: Home / Self Care | Payer: BC Managed Care – PPO | Source: Ambulatory Visit | Attending: Nephrology | Admitting: Nephrology

## 2013-05-26 VITALS — BP 157/68 | HR 72 | Temp 98.0°F | Resp 18 | Ht 66.5 in | Wt 339.0 lb

## 2013-05-26 DIAGNOSIS — N179 Acute kidney failure, unspecified: Secondary | ICD-10-CM

## 2013-05-26 DIAGNOSIS — D649 Anemia, unspecified: Secondary | ICD-10-CM

## 2013-05-26 LAB — IRON AND TIBC
Iron: 25 ug/dL — ABNORMAL LOW (ref 42–135)
Saturation Ratios: 15 % — ABNORMAL LOW (ref 20–55)
TIBC: 164 ug/dL — ABNORMAL LOW (ref 250–470)

## 2013-05-26 LAB — POCT HEMOGLOBIN-HEMACUE: Hemoglobin: 8.8 g/dL — ABNORMAL LOW (ref 12.0–15.0)

## 2013-05-26 LAB — FERRITIN: Ferritin: 402 ng/mL — ABNORMAL HIGH (ref 10–291)

## 2013-05-26 MED ORDER — FERUMOXYTOL INJECTION 510 MG/17 ML
INTRAVENOUS | Status: AC
  Administered 2013-05-26: 510 mg via INTRAVENOUS
  Filled 2013-05-26: qty 17

## 2013-05-26 MED ORDER — FERUMOXYTOL INJECTION 510 MG/17 ML: 510.0000 mg | INTRAVENOUS | Status: AC

## 2013-05-26 MED ORDER — SODIUM CHLORIDE 0.9 % IV SOLN
INTRAVENOUS | Status: AC
  Administered 2013-05-26: 13:00:00 via INTRAVENOUS

## 2013-05-26 MED ORDER — DARBEPOETIN ALFA-POLYSORBATE 200 MCG/0.4ML IJ SOLN: 200.0000 ug | INTRAMUSCULAR | Status: DC

## 2013-05-26 MED ORDER — DARBEPOETIN ALFA-POLYSORBATE 200 MCG/0.4ML IJ SOLN
INTRAMUSCULAR | Status: AC
  Administered 2013-05-26: 200 ug via SUBCUTANEOUS
  Filled 2013-05-26: qty 0.4

## 2013-06-02 ENCOUNTER — Encounter (HOSPITAL_COMMUNITY)
Admission: RE | Admit: 2013-06-02 | Discharge: 2013-06-02 | Disposition: A | Discharge status: Home / Self Care | Payer: BC Managed Care – PPO | Source: Ambulatory Visit | Attending: Nephrology | Admitting: Nephrology

## 2013-06-02 VITALS — BP 168/100 | HR 71 | Temp 98.2°F | Resp 18

## 2013-06-02 DIAGNOSIS — N179 Acute kidney failure, unspecified: Secondary | ICD-10-CM | POA: Insufficient documentation

## 2013-06-02 DIAGNOSIS — D649 Anemia, unspecified: Secondary | ICD-10-CM | POA: Insufficient documentation

## 2013-06-02 DIAGNOSIS — N189 Chronic kidney disease, unspecified: Secondary | ICD-10-CM | POA: Insufficient documentation

## 2013-06-02 MED ORDER — DARBEPOETIN ALFA-POLYSORBATE 200 MCG/0.4ML IJ SOLN
INTRAMUSCULAR | Status: AC
  Administered 2013-06-02: 200 ug via SUBCUTANEOUS
  Filled 2013-06-02: qty 0.4

## 2013-06-02 MED ORDER — DARBEPOETIN ALFA-POLYSORBATE 200 MCG/0.4ML IJ SOLN: 200.0000 ug | INTRAMUSCULAR | Status: DC

## 2013-06-09 ENCOUNTER — Encounter (HOSPITAL_COMMUNITY)
Admission: RE | Admit: 2013-06-09 | Discharge: 2013-06-09 | Disposition: A | Discharge status: Home / Self Care | Payer: BC Managed Care – PPO | Source: Ambulatory Visit | Attending: Nephrology | Admitting: Nephrology

## 2013-06-09 DIAGNOSIS — D649 Anemia, unspecified: Secondary | ICD-10-CM

## 2013-06-09 DIAGNOSIS — N179 Acute kidney failure, unspecified: Secondary | ICD-10-CM

## 2013-06-09 MED ORDER — DARBEPOETIN ALFA-POLYSORBATE 200 MCG/0.4ML IJ SOLN
200.0000 ug | INTRAMUSCULAR | Status: DC
  Administered 2013-06-09: 200 ug via SUBCUTANEOUS

## 2013-06-09 MED ORDER — DARBEPOETIN ALFA-POLYSORBATE 200 MCG/0.4ML IJ SOLN
INTRAMUSCULAR | Status: AC
  Filled 2013-06-09: qty 0.4

## 2013-06-11 ENCOUNTER — Encounter (HOSPITAL_COMMUNITY): Payer: Self-pay | Admitting: Pharmacy Technician

## 2013-06-12 ENCOUNTER — Other Ambulatory Visit (HOSPITAL_COMMUNITY): Payer: Self-pay | Admitting: Urology

## 2013-06-12 ENCOUNTER — Other Ambulatory Visit: Payer: Self-pay | Admitting: Urology

## 2013-06-12 NOTE — Progress Notes (Signed)
Cardiac clearance note 05-02-2013 dr Radford Pax on chart 05-09-2013 stress test dr Radford Pax on chart 10-13-2010 echo dr turner on chart

## 2013-06-12 NOTE — Patient Instructions (Addendum)
[place]  06/12/2013   Your procedure is scheduled on: Monday 06-23-2013  Report to [place] at 1000 AM.  Call this number if you have problems the morning of surgery [phone]   Remember:   Do not eat food or drink liquids :After Midnight.     Take these medicines the morning of surgery with A SIP OF WATER: cymbalta, cardvedilol, isosorbide mononitrate, levothryoxine, oxycodone if needed                                [place]   Do not wear jewelry, make-up or nail polish.  Do not wear lotions, powders, or perfumes. You may wear deodorant.   Men may shave face and neck.  Do not bring valuables to the hospital. [place].  Contacts, dentures or bridgework may not be worn into surgery.  Leave suitcase in the car. After surgery it may be brought to your room.  For patients admitted to the hospital, checkout time is 11:00 AM the day of discharge.   Patients discharged the day of surgery will not be allowed to drive home.  Name and phone number of your driver:marjorie bryant mother cell [phone]  Special Instructions: N/A  Please read over the following fact sheets that you were given: MRSA Information.  Call Zelphia Cairo RN pre op nurse if needed [phone]    FAILURE TO FOLLOW THESE INSTRUCTIONS MAY RESULT IN THE CANCELLATION OF YOUR SURGERY.  PATIENT SIGNATURE___________________________________________  NURSE SIGNATURE_____________________________________________

## 2013-06-13 ENCOUNTER — Ambulatory Visit (HOSPITAL_COMMUNITY)
Admission: RE | Admit: 2013-06-13 | Discharge: 2013-06-13 | Disposition: A | Discharge status: Home / Self Care | Payer: BC Managed Care – PPO | Source: Ambulatory Visit | Attending: Urology | Admitting: Urology

## 2013-06-13 ENCOUNTER — Other Ambulatory Visit: Payer: Self-pay | Admitting: Urology

## 2013-06-13 ENCOUNTER — Encounter (HOSPITAL_COMMUNITY): Payer: Self-pay

## 2013-06-13 ENCOUNTER — Encounter (HOSPITAL_COMMUNITY)
Admission: RE | Admit: 2013-06-13 | Discharge: 2013-06-13 | Disposition: A | Discharge status: Home / Self Care | Payer: BC Managed Care – PPO | Source: Ambulatory Visit | Attending: Urology | Admitting: Urology

## 2013-06-13 DIAGNOSIS — R0989 Other specified symptoms and signs involving the circulatory and respiratory systems: Secondary | ICD-10-CM | POA: Insufficient documentation

## 2013-06-13 DIAGNOSIS — I517 Cardiomegaly: Secondary | ICD-10-CM | POA: Insufficient documentation

## 2013-06-13 HISTORY — DX: Pneumonia, unspecified organism: J18.9

## 2013-06-13 HISTORY — DX: Urinary tract infection, site not specified: N39.0

## 2013-06-13 HISTORY — DX: Personal history of other medical treatment: Z92.89

## 2013-06-13 LAB — BASIC METABOLIC PANEL
Chloride: 107 mEq/L (ref 96–112)
GFR calc Af Amer: 21 mL/min — ABNORMAL LOW (ref >90)
GFR calc non Af Amer: 18 mL/min — ABNORMAL LOW (ref >90)
Potassium: 3.9 mEq/L (ref 3.5–5.1)
Sodium: 143 mEq/L (ref 135–145)

## 2013-06-13 LAB — HCG, SERUM, QUALITATIVE: Preg, Serum: NEGATIVE

## 2013-06-13 LAB — CBC
HCT: 33.2 % — ABNORMAL LOW (ref 36.0–46.0)
Hemoglobin: 9.4 g/dL — ABNORMAL LOW (ref 12.0–15.0)
MCHC: 28.3 g/dL — ABNORMAL LOW (ref 30.0–36.0)
WBC: 14.4 10*3/uL — ABNORMAL HIGH (ref 4.0–10.5)

## 2013-06-13 NOTE — Progress Notes (Signed)
Cbc, bmet results faxed by epic to dr Jasmine December

## 2013-06-13 NOTE — Progress Notes (Signed)
06/13/13 1402  OBSTRUCTIVE SLEEP APNEA  Have you ever been diagnosed with sleep apnea through a sleep study? No  Do you snore loudly (loud enough to be heard through closed doors)?  1  Do you often feel tired, fatigued, or sleepy during the daytime? 0  Has anyone observed you stop breathing during your sleep? 0  Do you have, or are you being treated for high blood pressure? 1  BMI more than 35 kg/m2? 1  Age over 51 years old? 1  Neck circumference greater than 40 cm/18 inches? 0  Gender: 0  Obstructive Sleep Apnea Score 4  Score 4 or greater  Results sent to PCP

## 2013-06-16 ENCOUNTER — Encounter (HOSPITAL_COMMUNITY)
Admission: RE | Admit: 2013-06-16 | Discharge: 2013-06-16 | Disposition: A | Discharge status: Home / Self Care | Payer: BC Managed Care – PPO | Source: Ambulatory Visit | Attending: Nephrology | Admitting: Nephrology

## 2013-06-16 VITALS — BP 168/77 | HR 76 | Temp 98.3°F | Resp 20

## 2013-06-16 DIAGNOSIS — N179 Acute kidney failure, unspecified: Secondary | ICD-10-CM

## 2013-06-16 DIAGNOSIS — D649 Anemia, unspecified: Secondary | ICD-10-CM

## 2013-06-16 LAB — POCT HEMOGLOBIN-HEMACUE: Hemoglobin: 10.2 g/dL — ABNORMAL LOW (ref 12.0–15.0)

## 2013-06-16 MED ORDER — DARBEPOETIN ALFA-POLYSORBATE 200 MCG/0.4ML IJ SOLN
INTRAMUSCULAR | Status: AC
  Administered 2013-06-16: 200 ug via SUBCUTANEOUS
  Filled 2013-06-16: qty 0.4

## 2013-06-16 MED ORDER — DARBEPOETIN ALFA-POLYSORBATE 200 MCG/0.4ML IJ SOLN: 200.0000 ug | INTRAMUSCULAR | Status: DC

## 2013-06-22 MED ORDER — DEXTROSE 5 % IV SOLN
2.0000 g | Freq: Once | INTRAVENOUS | Status: AC
  Administered 2013-06-23: 2 g via INTRAVENOUS
  Filled 2013-06-22: qty 2

## 2013-06-22 NOTE — H&P (Signed)
Urology History and Physical Exam  CC: Right nephrolithiasis  HPI:  52 year old female presents for right nephrolithiasis. She has a significant history of nephrolithiasis which resulted in obstruction of her urinary system. She developed urosepsis and required stent placement and exchange. This all occurred in December 2013. The patient has not been consistent with followup and was referred to me for treatment of her stones. CT scan December 2013 revealed a right ureteropelvic junction stone which is 1 cm in size. There is also a right lower pole stone which is 0.6 cm in size. This has been associated with recurrent infection. She presented to my clinic for further evaluation and discussed management options. She has stent discomfort. It is not associated gross hematuria. Nothing seems to make the stone better or worse. We discussed management options along with risks, benefits, alternatives, and likelihood of achieving goals. She decided to proceed with cystoscopy, right ureteroscopy, laser lithotripsy, right ureter stent exchange, and right retrograde pyelogram.   Urine culture 06/09/13 revealed Escherichia coli which was sensitive to tetracycline; she was started on oral doxycycline prior to surgery. She will also receive Rocephin today prior surgery for which the Escherichia coli sensitive.   She received cardiac clearance on 05/13/13 by Dr. Dorothy Spark Cardiology: Stress test showed adequate blood flow and low normal LVF. Low risk for cardiac complications during surgery. Ok to stop aspirin & blood thinners 5 days prior to surgery.    PMH: Past Medical History  Diagnosis Date  . Systolic congestive heart failure   . SOB (shortness of breath)   . HTN (hypertension)   . Iron deficiency anemia   . Morbid obesity   . Hyperkalemia   . Hypothyroidism   . CHF (congestive heart failure)   . Hydradenitis   . Pneumonia Nov 10, 2012  . DM type 2 (diabetes mellitus, type 2)     diet  controlled  . History of hemodialysis dec 2013    none currently  . Urinary tract infection     taking antibiotics for 3 days prior to surgery  . Chronic kidney disease (CKD), stage III (moderate)     [name] nephrology lov note 05-12-2013 on pt chart    PSH: Past Surgical History  Procedure Laterality Date  . Portacath placement    . Cystoscopy w/ ureteral stent placement  05/28/2012    Procedure: CYSTOSCOPY WITH RETROGRADE PYELOGRAM/URETERAL STENT PLACEMENT;  Surgeon: Reece Packer, MD;  Location: [facility];  Service: Urology;  Laterality: Left;  . Cystoscopy w/ retrogrades  11/16/2012    Procedure: CYSTOSCOPY WITH RETROGRADE PYELOGRAM;  Surgeon: Reece Packer, MD;  Location: [facility];  Service: Urology;  Laterality: Bilateral;  CYSTOSCOPY,BILATERAL RETROGRADE PYELOGRAM/ REMOVAL LEFT URETERAL STENT/ FULGERATION BLADDER MUCOSA/ INSERTION RIGHT URETERAL STENT  . Port-a-cath removal      Allergies: Allergies  Allergen Reactions  . Amoxicillin-Pot Clavulanate Diarrhea  . Rosiglitazone Maleate     REACTION: swelling  . Amoxicillin Rash    Tolerated Zosyn 01/2013.  [name]    Medications: No prescriptions prior to admission     Social History: History   Social History  . Marital Status: Married    Spouse Name: N/A    Number of Children: N/A  . Years of Education: N/A   Occupational History  . Not on file.   Social History Main Topics  . Smoking status: Never Smoker   . Smokeless tobacco: Never Used  . Alcohol Use: No  . Drug Use: No  .  Sexually Active: No   Other Topics Concern  . Not on file   Social History Narrative  . No narrative on file    Family History: Family History  Problem Relation Age of Onset  . Coronary artery disease Mother   . Hypertension Mother   . Diabetes type II Mother   . Malignant hyperthermia Mother   . Coronary artery disease Father   . Hypertension Father   . Malignant hyperthermia Father   . Cancer Maternal Grandfather      ? Type  . Kidney failure Maternal Grandmother     Review of Systems: Positive: None. Negative: Fever, SOB, or chest pain.  A further 10 point review of systems was negative except what is listed in the HPI.  Physical Exam: Filed Vitals:   06/23/13 1023  BP: 161/82  Pulse: 85  Temp: 98.6 F (37 C)  Resp: 22    General: No acute distress.  Awake. Head:  Normocephalic.  Atraumatic. ENT:  EOMI.  Mucous membranes moist Neck:  Supple.  No lymphadenopathy. CV:  S1 present. S2 present. Regular rate. Pulmonary: Equal effort bilaterally.  Clear to auscultation bilaterally. Abdomen: Soft.  Non- tender to palpation. Skin:  Normal turgor.  No visible rash. Extremity: No gross deformity of bilateral upper extremities.  No gross deformity of    bilateral lower extremities. Neurologic: Alert. Appropriate mood.    Studies:  No results found for this basename: HGB, WBC, PLT,  in the last 72 hours  No results found for this basename: NA, K, CL, CO2, BUN, CREATININE, CALCIUM, MAGNESIUM, GFRNONAA, GFRAA,  in the last 72 hours   No results found for this basename: PT, INR, APTT,  in the last 72 hours   No components found with this basename: ABG,     Assessment:  Right nephrolithiasis  Plan: To the OR for cystoscopy, right ureteroscopy, laser lithotripsy, right ureter stent exchange, and right retrograde pyelogram.

## 2013-06-23 ENCOUNTER — Encounter (HOSPITAL_COMMUNITY): Payer: Self-pay | Admitting: Anesthesiology

## 2013-06-23 ENCOUNTER — Encounter (HOSPITAL_COMMUNITY): Payer: Self-pay

## 2013-06-23 ENCOUNTER — Ambulatory Visit (HOSPITAL_COMMUNITY): Payer: BC Managed Care – PPO | Admitting: Anesthesiology

## 2013-06-23 ENCOUNTER — Ambulatory Visit (HOSPITAL_COMMUNITY): Payer: BC Managed Care – PPO

## 2013-06-23 ENCOUNTER — Encounter (HOSPITAL_COMMUNITY)
Admission: RE | Disposition: A | Discharge status: Home / Self Care | Payer: Self-pay | Source: Ambulatory Visit | Attending: Urology

## 2013-06-23 ENCOUNTER — Ambulatory Visit (HOSPITAL_COMMUNITY)
Admission: RE | Admit: 2013-06-23 | Discharge: 2013-06-23 | Disposition: A | Discharge status: Home / Self Care | Payer: BC Managed Care – PPO | Source: Ambulatory Visit | Attending: Urology | Admitting: Urology

## 2013-06-23 DIAGNOSIS — I509 Heart failure, unspecified: Secondary | ICD-10-CM | POA: Insufficient documentation

## 2013-06-23 DIAGNOSIS — N200 Calculus of kidney: Secondary | ICD-10-CM | POA: Insufficient documentation

## 2013-06-23 DIAGNOSIS — E119 Type 2 diabetes mellitus without complications: Secondary | ICD-10-CM | POA: Insufficient documentation

## 2013-06-23 DIAGNOSIS — R0602 Shortness of breath: Secondary | ICD-10-CM | POA: Insufficient documentation

## 2013-06-23 DIAGNOSIS — E039 Hypothyroidism, unspecified: Secondary | ICD-10-CM | POA: Insufficient documentation

## 2013-06-23 DIAGNOSIS — I129 Hypertensive chronic kidney disease with stage 1 through stage 4 chronic kidney disease, or unspecified chronic kidney disease: Secondary | ICD-10-CM | POA: Insufficient documentation

## 2013-06-23 DIAGNOSIS — I5020 Unspecified systolic (congestive) heart failure: Secondary | ICD-10-CM | POA: Insufficient documentation

## 2013-06-23 DIAGNOSIS — N183 Chronic kidney disease, stage 3 unspecified: Secondary | ICD-10-CM | POA: Insufficient documentation

## 2013-06-23 HISTORY — PX: HOLMIUM LASER APPLICATION: SHX5852

## 2013-06-23 HISTORY — PX: CYSTOSCOPY WITH RETROGRADE PYELOGRAM, URETEROSCOPY AND STENT PLACEMENT: SHX5789

## 2013-06-23 LAB — GLUCOSE, CAPILLARY
Glucose-Capillary: 103 mg/dL — ABNORMAL HIGH (ref 70–99)
Glucose-Capillary: 125 mg/dL — ABNORMAL HIGH (ref 70–99)

## 2013-06-23 SURGERY — CYSTOURETEROSCOPY, WITH RETROGRADE PYELOGRAM AND STENT INSERTION
Anesthesia: General | Site: Ureter | Laterality: Right | Wound class: Clean Contaminated

## 2013-06-23 MED ORDER — FENTANYL CITRATE 0.05 MG/ML IJ SOLN
INTRAMUSCULAR | Status: DC | PRN
  Administered 2013-06-23 (×2): 25 ug via INTRAVENOUS
  Administered 2013-06-23: 50 ug via INTRAVENOUS

## 2013-06-23 MED ORDER — NEOSTIGMINE METHYLSULFATE 1 MG/ML IJ SOLN
INTRAMUSCULAR | Status: DC | PRN
  Administered 2013-06-23: 4 mg via INTRAVENOUS

## 2013-06-23 MED ORDER — OXYCODONE-ACETAMINOPHEN 5-325 MG PO TABS
1.0000 | ORAL_TABLET | ORAL | Status: DC | PRN
  Administered 2013-06-23: 1 via ORAL
  Filled 2013-06-23: qty 1

## 2013-06-23 MED ORDER — IOHEXOL 300 MG/ML  SOLN
INTRAMUSCULAR | Status: AC
  Filled 2013-06-23: qty 1

## 2013-06-23 MED ORDER — LACTATED RINGERS IV SOLN
INTRAVENOUS | Status: DC
  Administered 2013-06-23: 1 via INTRAVENOUS

## 2013-06-23 MED ORDER — CISATRACURIUM BESYLATE (PF) 10 MG/5ML IV SOLN
INTRAVENOUS | Status: DC | PRN
  Administered 2013-06-23: 4 mg via INTRAVENOUS
  Administered 2013-06-23: 2 mg via INTRAVENOUS

## 2013-06-23 MED ORDER — MEPERIDINE HCL 50 MG/ML IJ SOLN: 6.2500 mg | INTRAMUSCULAR | Status: DC | PRN

## 2013-06-23 MED ORDER — FENTANYL CITRATE 0.05 MG/ML IJ SOLN
INTRAMUSCULAR | Status: AC
  Filled 2013-06-23: qty 2

## 2013-06-23 MED ORDER — FENTANYL CITRATE 0.05 MG/ML IJ SOLN
25.0000 ug | INTRAMUSCULAR | Status: DC | PRN
  Administered 2013-06-23 (×2): 25 ug via INTRAVENOUS

## 2013-06-23 MED ORDER — GLYCOPYRROLATE 0.2 MG/ML IJ SOLN
INTRAMUSCULAR | Status: DC | PRN
  Administered 2013-06-23: 0.6 mg via INTRAVENOUS

## 2013-06-23 MED ORDER — ONDANSETRON HCL 4 MG/2ML IJ SOLN
INTRAMUSCULAR | Status: DC | PRN
  Administered 2013-06-23: 4 mg via INTRAVENOUS

## 2013-06-23 MED ORDER — OXYCODONE-ACETAMINOPHEN 5-325 MG PO TABS: 1.0000 | ORAL_TABLET | ORAL | Status: DC | PRN

## 2013-06-23 MED ORDER — LACTATED RINGERS IV SOLN
INTRAVENOUS | Status: DC | PRN
  Administered 2013-06-23: 12:00:00 via INTRAVENOUS

## 2013-06-23 MED ORDER — PROMETHAZINE HCL 25 MG/ML IJ SOLN: 6.2500 mg | INTRAMUSCULAR | Status: DC | PRN

## 2013-06-23 MED ORDER — SENNOSIDES-DOCUSATE SODIUM 8.6-50 MG PO TABS: 1.0000 | ORAL_TABLET | Freq: Two times a day (BID) | ORAL | Status: DC

## 2013-06-23 MED ORDER — SUCCINYLCHOLINE CHLORIDE 20 MG/ML IJ SOLN
INTRAMUSCULAR | Status: DC | PRN
  Administered 2013-06-23: 140 mg via INTRAVENOUS

## 2013-06-23 MED ORDER — ACETAMINOPHEN 10 MG/ML IV SOLN
1000.0000 mg | Freq: Once | INTRAVENOUS | Status: DC | PRN
  Filled 2013-06-23: qty 100

## 2013-06-23 MED ORDER — LIDOCAINE HCL 2 % EX GEL
CUTANEOUS | Status: DC | PRN
  Administered 2013-06-23: 1 via URETHRAL

## 2013-06-23 MED ORDER — PROPOFOL 10 MG/ML IV BOLUS
INTRAVENOUS | Status: DC | PRN
  Administered 2013-06-23: 200 mg via INTRAVENOUS

## 2013-06-23 MED ORDER — CIPROFLOXACIN IN D5W 400 MG/200ML IV SOLN
INTRAVENOUS | Status: AC
  Filled 2013-06-23: qty 200

## 2013-06-23 MED ORDER — LIDOCAINE HCL 2 % EX GEL
CUTANEOUS | Status: AC
  Filled 2013-06-23: qty 10

## 2013-06-23 MED ORDER — SODIUM CHLORIDE 0.9 % IR SOLN
Status: DC | PRN
  Administered 2013-06-23: 6000 mL

## 2013-06-23 MED ORDER — HYOSCYAMINE SULFATE 0.125 MG PO TABS: 0.1250 mg | ORAL_TABLET | ORAL | Status: DC | PRN

## 2013-06-23 SURGICAL SUPPLY — 41 items
BAG URO CATCHER STRL LF (DRAPE) ×3 IMPLANT
BASKET LASER NITINOL 1.9FR (BASKET) IMPLANT
BASKET STNLS GEMINI 4WIRE 3FR (BASKET) IMPLANT
BASKET ZERO TIP NITINOL 2.4FR (BASKET) ×1 IMPLANT
BRUSH URET BIOPSY 3F (UROLOGICAL SUPPLIES) IMPLANT
BSKT STON RTRVL 120 1.9FR (BASKET)
BSKT STON RTRVL GEM 120X11 3FR (BASKET)
BSKT STON RTRVL ZERO TP 2.4FR (BASKET) ×2
CATH CLEAR GEL 3F BACKSTOP (CATHETERS) IMPLANT
CATH URET 5FR 28IN CONE TIP (BALLOONS)
CATH URET 5FR 28IN OPEN ENDED (CATHETERS) ×2 IMPLANT
CATH URET 5FR 70CM CONE TIP (BALLOONS) IMPLANT
CATH URET DUAL LUMEN 6-10FR 50 (CATHETERS) IMPLANT
CLOTH BEACON ORANGE TIMEOUT ST (SAFETY) ×3 IMPLANT
DRAPE CAMERA CLOSED 9X96 (DRAPES) ×3 IMPLANT
FIBER LASER TRAC TIP (UROLOGICAL SUPPLIES) ×1 IMPLANT
GLOVE BIOGEL M 7.0 STRL (GLOVE) IMPLANT
GLOVE ECLIPSE 7.0 STRL STRAW (GLOVE) ×5 IMPLANT
GLOVE SURG SS PI 8.0 STRL IVOR (GLOVE) ×2 IMPLANT
GOWN PREVENTION PLUS LG XLONG (DISPOSABLE) ×3 IMPLANT
GOWN PREVENTION PLUS XLARGE (GOWN DISPOSABLE) ×2 IMPLANT
GOWN STRL REIN XL XLG (GOWN DISPOSABLE) ×2 IMPLANT
GUIDEWIRE ANG ZIPWIRE 038X150 (WIRE) ×1 IMPLANT
GUIDEWIRE STR DUAL SENSOR (WIRE) ×4 IMPLANT
HOVERMATT SINGLE USE (MISCELLANEOUS) ×1 IMPLANT
IV NS IRRIG 3000ML ARTHROMATIC (IV SOLUTION) ×2 IMPLANT
KIT BALLIN UROMAX 15FX10 (LABEL) IMPLANT
KIT BALLN UROMAX 15FX4 (MISCELLANEOUS) IMPLANT
KIT BALLN UROMAX 26 75X4 (MISCELLANEOUS)
LASER FIBER DISP (UROLOGICAL SUPPLIES) IMPLANT
MANIFOLD NEPTUNE II (INSTRUMENTS) ×3 IMPLANT
MARKER SKIN DUAL TIP RULER LAB (MISCELLANEOUS) ×2 IMPLANT
PACK CYSTO (CUSTOM PROCEDURE TRAY) ×3 IMPLANT
SCRUB PCMX 4 OZ (MISCELLANEOUS) ×1 IMPLANT
SET HIGH PRES BAL DIL (LABEL)
SHEATH ACCESS URETERAL 38CM (SHEATH) ×1 IMPLANT
SHEATH URET ACCESS 12FR/35CM (UROLOGICAL SUPPLIES) IMPLANT
SHEATH URET ACCESS 12FR/55CM (UROLOGICAL SUPPLIES) IMPLANT
STENT CONTOUR 6FRX26X.038 (STENTS) ×1 IMPLANT
SYRINGE IRR TOOMEY STRL 70CC (SYRINGE) ×1 IMPLANT
TUBING CONNECTING 10 (TUBING) ×3 IMPLANT

## 2013-06-23 NOTE — Anesthesia Postprocedure Evaluation (Signed)
Anesthesia Post Note  Patient: [name]  Procedure(s) Performed: Procedure(s) (LRB): CYSTOSCOPY WITH RIGHT URETEROSCOPY, RIGHT  RETROGRADE PYELOGRAM, WITH LASER LIPOTRIPSY AND RIGHT URETERAL STENT EXCHANGE  (Right) HOLMIUM LASER APPLICATION (N/A)  Anesthesia type: General  Patient location: PACU  Post pain: Pain level controlled  Post assessment: Post-op Vital signs reviewed  Last Vitals: BP 115/64  Pulse 73  Temp(Src) 37.1 C (Oral)  Resp 21  SpO2 94%  Post vital signs: Reviewed  Level of consciousness: sedated  Complications: No apparent anesthesia complications

## 2013-06-23 NOTE — Preoperative (Signed)
Beta Blockers   Reason not to administer Beta Blockers:Not Applicable 

## 2013-06-23 NOTE — Op Note (Signed)
Urology Operative Report  Date of Procedure: 06/23/13  Surgeon: Rolan Bucco, MD Assistant:  None  Preoperative Diagnosis: Right nephrolithiasis. Postoperative Diagnosis:  Same  Procedure(s): Cystoscopy Right ureteroscopy Laser lithotripsy Right ureter stent removal Right ureter stent placement (6 x 26) Fluoroscopy With interpretation  Estimated blood loss: None  Specimen: Stones sent for chemical analysis to [facility] lab.  Drains: None  Complications: None  Findings: Encrusted right ureter stent. 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 Large amount of urinary sediment and debris in the right kidney. Right nephrolithiasis. Edema at the right ureteropelvic junction.  History of present illness: 52 year old female who has been poorly compliant with followup and history of urosepsis due to obstructing stones.  She presents today for ureteroscopy for right nephrolithiasis.   Procedure in detail: After informed consent was obtained, the patient was taken to the operating room. They were placed in the supine position. SCDs were turned on and in place. IV antibiotics were infused, and general anesthesia was induced. A timeout was performed in which the correct patient, surgical site, and procedure were identified and agreed upon by the team.  The patient was placed in a dorsolithotomy position, making sure to pad all pertinent neurovascular pressure points. The genitals were prepped and draped in the usual sterile fashion.  A rigid cystoscope was advanced through the urethra and into the bladder.  There was noted to be urinary sediment throughout the bladder.  Attention was turned the right ureteral orifice where the right uterus that was noted the encrusted with a tissue-like substance and stone.  A sensor tip wire was placed alongside this under fluoroscopy and into the right renal pelvis.  A grasper was then used to grasp the tip of the stent and bring it to the urethral meatus where a second  sensor wire was placed through the stent and the stent was able to be removed with ease.  A 12-14 ureter access sheath was then placed over the working wire to the proximal ureter under fluoroscopy with ease and the obturator and working wire were removed.  I was able to place a flexible ureter scope into the kidney, but due to the large amount of sediment I could not visualize internal kidney well and switched to a flexible digital ureter scope.  This improved visualization.  I was able to irrigate some of the kidney of the sediment.  I found a large obstructing kidneys down and perform lithotripsy with a 200  holmium laser fiber at a setting of 0.5 J and 20 Hz.  The pieces were broken up into smaller pieces but could not be grasped and the larger pieces were grasped and removed with a zero tip Nitinol basket.  I removed the ureter access sheath to visualize the entire surface of the ureter and this is found to be open without any injury to the ureter mucosa.  The ureter was wide and able to be navigated easily.  I removed further stone fragments.  It was noted that she had a large amount of edema at the ureteropelvic junction which could have been from the stent sitting here.  Due to the large amount of sediment in her urine I was concerned that her urine may not drain well from the kidney likely due to be encrusted stent she had in place.  I therefore elected to replace a stent to maximize drainage.  A 6 x 26 double-J uterus was placed over the wire through the cystoscope and the plate into the right renal pelvis under fluoroscopy.  There is a good curl noted in the bladder.  The bladder was drained and this completed the procedure.  Is placed back in supine position, anesthesia was reversed, and she was taken to the PACU in stable condition.   All counts were correct at the end of the case.  She will continue on doxycycline and likely need a repeat ureteroscopic fissure that she is cleared out the  sediment vs. removing the stent in clinic.

## 2013-06-23 NOTE — Progress Notes (Signed)
Patient returns from PACU on stretcher. OOB to recliner chair. Perineum is excoriated and skin is weepy. Sheets underneath patient are saturated with urine and from perineum. Incentive spirometer times 8 up to 750.

## 2013-06-23 NOTE — Transfer of Care (Signed)
Immediate Anesthesia Transfer of Care Note  Patient: [name]  Procedure(s) Performed: Procedure(s) (LRB): CYSTOSCOPY WITH RIGHT URETEROSCOPY, RIGHT  RETROGRADE PYELOGRAM, WITH LASER LIPOTRIPSY AND RIGHT URETERAL STENT EXCHANGE  (Right) HOLMIUM LASER APPLICATION (N/A)  Patient Location: PACU  Anesthesia Type: General  Level of Consciousness: sedated, patient cooperative and responds to stimulaton  Airway & Oxygen Therapy: Patient Spontanous Breathing and Patient connected to face mask oxgen  Post-op Assessment: Report given to PACU RN and Post -op Vital signs reviewed and stable  Post vital signs: Reviewed and stable  Complications: No apparent anesthesia complications

## 2013-06-23 NOTE — Anesthesia Preprocedure Evaluation (Addendum)
Anesthesia Evaluation  Patient identified by MRN, date of birth, ID band Patient awake    Reviewed: Allergy & Precautions, H&P , NPO status , Patient's Chart, lab work & pertinent test results, reviewed documented beta blocker date and time   Airway Mallampati: II TM Distance: >3 FB Neck ROM: full    Dental no notable dental hx. (+) Dental Advisory Given, Loose, Poor Dentition, Partial Lower and Partial Upper   Pulmonary neg pulmonary ROS, shortness of breath,  Intubated on vent PRVC RR 25 Minute volume about 13, 100% fiO2, PEEP 5 Acute hypoxemic and hypercarbic respiratory failure, bilateral pneumonia, acidosis breath sounds clear to auscultation  Pulmonary exam normal       Cardiovascular Exercise Tolerance: Good hypertension, Pt. on medications +CHF negative cardio ROS  Rhythm:regular Rate:Normal  LVEF 35-40%, HTN   Neuro/Psych PSYCHIATRIC DISORDERS negative neurological ROS  negative psych ROS   GI/Hepatic negative GI ROS, Neg liver ROS, GERD-  ,  Endo/Other  negative endocrine ROSdiabetes, Type 2Hypothyroidism   Renal/GU Renal diseaseRecent septic shock, ARF.  negative genitourinary   Musculoskeletal   Abdominal (+) + obese,   Peds  Hematology negative hematology ROS (+) Hgb 7.0   Anesthesia Other Findings Patient transferred from [facility] - presently intubated and on fentanyl drip.  Reproductive/Obstetrics negative OB ROS                          Anesthesia Physical  Anesthesia Plan  ASA: IV and emergent  Anesthesia Plan: General   Post-op Pain Management:    Induction:   Airway Management Planned: Oral ETT  Additional Equipment:   Intra-op Plan:   Post-operative Plan: Post-operative intubation/ventilation  Informed Consent: I have reviewed the patients History and Physical, chart, labs and discussed the procedure including the risks, benefits and alternatives for  the proposed anesthesia with the patient or authorized representative who has indicated his/her understanding and acceptance.   Dental Advisory Given  Plan Discussed with: CRNA  Anesthesia Plan Comments: (Discussed with patient who is able to write questions on paper.)        Anesthesia Quick Evaluation

## 2013-06-24 ENCOUNTER — Encounter (HOSPITAL_COMMUNITY): Payer: Self-pay | Admitting: Urology

## 2013-06-27 ENCOUNTER — Other Ambulatory Visit (HOSPITAL_COMMUNITY): Payer: Self-pay | Admitting: *Deleted

## 2013-06-27 ENCOUNTER — Encounter (HOSPITAL_COMMUNITY): Payer: BC Managed Care – PPO

## 2013-06-30 ENCOUNTER — Encounter (HOSPITAL_COMMUNITY): Payer: BC Managed Care – PPO

## 2013-06-30 ENCOUNTER — Encounter (HOSPITAL_COMMUNITY)
Admission: RE | Admit: 2013-06-30 | Discharge: 2013-06-30 | Disposition: A | Discharge status: Home / Self Care | Payer: BC Managed Care – PPO | Source: Ambulatory Visit | Attending: Nephrology | Admitting: Nephrology

## 2013-06-30 VITALS — BP 159/78 | HR 64 | Temp 97.8°F | Resp 20

## 2013-06-30 DIAGNOSIS — D649 Anemia, unspecified: Secondary | ICD-10-CM | POA: Insufficient documentation

## 2013-06-30 DIAGNOSIS — N189 Chronic kidney disease, unspecified: Secondary | ICD-10-CM

## 2013-06-30 DIAGNOSIS — N179 Acute kidney failure, unspecified: Secondary | ICD-10-CM | POA: Insufficient documentation

## 2013-06-30 LAB — POCT HEMOGLOBIN-HEMACUE: Hemoglobin: 10 g/dL — ABNORMAL LOW (ref 12.0–15.0)

## 2013-06-30 LAB — IRON AND TIBC
Iron: 26 ug/dL — ABNORMAL LOW (ref 42–135)
UIBC: 145 ug/dL (ref 125–400)

## 2013-06-30 LAB — RENAL FUNCTION PANEL
GFR calc Af Amer: 11 mL/min — ABNORMAL LOW (ref >90)
GFR calc non Af Amer: 9 mL/min — ABNORMAL LOW (ref >90)
Glucose, Bld: 125 mg/dL — ABNORMAL HIGH (ref 70–99)
Phosphorus: 4.9 mg/dL — ABNORMAL HIGH (ref 2.3–4.6)
Potassium: 4.2 mEq/L (ref 3.5–5.1)
Sodium: 141 mEq/L (ref 135–145)

## 2013-06-30 MED ORDER — DARBEPOETIN ALFA-POLYSORBATE 200 MCG/0.4ML IJ SOLN
INTRAMUSCULAR | Status: AC
  Administered 2013-06-30: 200 ug via SUBCUTANEOUS
  Filled 2013-06-30: qty 0.4

## 2013-06-30 MED ORDER — DARBEPOETIN ALFA-POLYSORBATE 200 MCG/0.4ML IJ SOLN: 200.0000 ug | INTRAMUSCULAR | Status: DC

## 2013-07-01 ENCOUNTER — Other Ambulatory Visit: Payer: Self-pay | Admitting: Nephrology

## 2013-07-02 ENCOUNTER — Ambulatory Visit
Admission: RE | Admit: 2013-07-02 | Discharge: 2013-07-02 | Disposition: A | Discharge status: Home / Self Care | Payer: BC Managed Care – PPO | Source: Ambulatory Visit | Attending: Nephrology | Admitting: Nephrology

## 2013-07-04 ENCOUNTER — Other Ambulatory Visit (HOSPITAL_COMMUNITY): Payer: Self-pay

## 2013-07-04 ENCOUNTER — Ambulatory Visit (HOSPITAL_COMMUNITY)
Admission: RE | Admit: 2013-07-04 | Discharge: 2013-07-04 | Disposition: A | Discharge status: Home / Self Care | Payer: BC Managed Care – PPO | Source: Ambulatory Visit | Attending: Urology | Admitting: Urology

## 2013-07-04 ENCOUNTER — Other Ambulatory Visit (HOSPITAL_COMMUNITY): Payer: Self-pay | Admitting: Urology

## 2013-07-04 DIAGNOSIS — R0602 Shortness of breath: Secondary | ICD-10-CM

## 2013-07-04 DIAGNOSIS — M7989 Other specified soft tissue disorders: Secondary | ICD-10-CM

## 2013-07-04 NOTE — Progress Notes (Signed)
*  Preliminary Results* Bilateral lower extremity venous duplex completed. Study was technically limited due to patient body habitus and shortness of breath. Visualized veins of bilateral lower extremities are negative for deep vein thrombosis. Unable to visualize bilateral saphenofemoral junctions, common femoral, and proximal femoral veins. There is no evidence of Baker's cyst bilaterally.  07/04/2013  Maudry Mayhew, RVT, RDCS, RDMS

## 2013-07-07 ENCOUNTER — Encounter (HOSPITAL_COMMUNITY)
Admission: RE | Admit: 2013-07-07 | Discharge: 2013-07-07 | Disposition: A | Discharge status: Home / Self Care | Payer: BC Managed Care – PPO | Source: Ambulatory Visit | Attending: Nephrology | Admitting: Nephrology

## 2013-07-07 VITALS — BP 149/69 | HR 69 | Temp 98.2°F | Resp 20

## 2013-07-07 DIAGNOSIS — D649 Anemia, unspecified: Secondary | ICD-10-CM

## 2013-07-07 DIAGNOSIS — N179 Acute kidney failure, unspecified: Secondary | ICD-10-CM

## 2013-07-07 LAB — RENAL FUNCTION PANEL
CO2: 21 mEq/L (ref 19–32)
Calcium: 8.9 mg/dL (ref 8.4–10.5)
Chloride: 108 mEq/L (ref 96–112)
GFR calc Af Amer: 12 mL/min — ABNORMAL LOW (ref >90)
GFR calc non Af Amer: 11 mL/min — ABNORMAL LOW (ref >90)
Glucose, Bld: 118 mg/dL — ABNORMAL HIGH (ref 70–99)
Potassium: 4.2 mEq/L (ref 3.5–5.1)
Sodium: 141 mEq/L (ref 135–145)

## 2013-07-07 LAB — POCT HEMOGLOBIN-HEMACUE: Hemoglobin: 10.6 g/dL — ABNORMAL LOW (ref 12.0–15.0)

## 2013-07-07 MED ORDER — DARBEPOETIN ALFA-POLYSORBATE 200 MCG/0.4ML IJ SOLN: 200.0000 ug | INTRAMUSCULAR | Status: DC

## 2013-07-07 MED ORDER — DARBEPOETIN ALFA-POLYSORBATE 200 MCG/0.4ML IJ SOLN
INTRAMUSCULAR | Status: AC
  Administered 2013-07-07: 200 ug via SUBCUTANEOUS
  Filled 2013-07-07: qty 0.4

## 2013-07-09 ENCOUNTER — Ambulatory Visit (INDEPENDENT_AMBULATORY_CARE_PROVIDER_SITE_OTHER): Payer: BC Managed Care – PPO | Admitting: Internal Medicine

## 2013-07-09 ENCOUNTER — Encounter: Payer: Self-pay | Admitting: Internal Medicine

## 2013-07-09 VITALS — BP 120/68 | HR 70 | Temp 98.3°F

## 2013-07-09 DIAGNOSIS — I10 Essential (primary) hypertension: Secondary | ICD-10-CM

## 2013-07-09 DIAGNOSIS — R0602 Shortness of breath: Secondary | ICD-10-CM

## 2013-07-09 MED ORDER — NEBIVOLOL HCL 10 MG PO TABS: 10.0000 mg | ORAL_TABLET | Freq: Every day | ORAL | Status: DC

## 2013-07-09 MED ORDER — FAMOTIDINE 20 MG PO TABS: ORAL_TABLET | ORAL | Status: DC

## 2013-07-09 MED ORDER — PANTOPRAZOLE SODIUM 40 MG PO TBEC: 40.0000 mg | DELAYED_RELEASE_TABLET | Freq: Every day | ORAL | Status: DC

## 2013-07-09 NOTE — Progress Notes (Signed)
  Subjective:    Patient ID: [name], female    DOB: Mar 30, 1961  MRN: [id number]  HPI  49 yobf perfectly health as child minimal smoking in the 1980's with morbid obesity referred by Dr Jasmine December for preop pulmonary clearance for kidney stone removal     07/09/2013 1st pulmonary eval cc doe with exertion but could go up steps p d/c fromr cone 2014 but then much more  Sob with desats after surgery 7/28  With General anesthesia for cysto and stent placement  assoc with min cough still can do steps but have to stop half way up one flight, worse when talk assoc with hoarseness and sometimes a wheezing sound. Tried "some inhalers" no better  No obvious daytime variabilty or assoc chronic cough or cp or chest tightness, subjective wheeze overt sinus or hb symptoms. No unusual exp hx or h/o childhood pna/ asthma or knowledge of premature birth.    Sleeping ok at 30 degrees without nocturnal  or early am exacerbation  of respiratory  c/o's or need for noct saba. Also denies any obvious fluctuation of symptoms with weather or environmental changes or other aggravating or alleviating factors except as outlined above   Review of Systems  Constitutional: Negative for fever, chills and unexpected weight change.  HENT: Positive for congestion. Negative for ear pain, nosebleeds, sore throat, rhinorrhea, sneezing, trouble swallowing, dental problem, voice change, postnasal drip and sinus pressure.   Eyes: Negative for visual disturbance.  Respiratory: Positive for cough and shortness of breath. Negative for choking.   Cardiovascular: Negative for chest pain and leg swelling.  Gastrointestinal: Negative for vomiting, abdominal pain and diarrhea.  Genitourinary: Negative for difficulty urinating.  Musculoskeletal: Negative for arthralgias.  Skin: Negative for rash.  Neurological: Negative for tremors, syncope and headaches.  Hematological: Does not bruise/bleed easily.       Objective:   Physical  Exam   Very hoarse w/c bound bf with classic pseuddowheeze Wt Readings from Last 3 Encounters:  06/13/13 345 lb 12.8 oz (156.854 kg)  05/26/13 339 lb (153.769 kg)  05/19/13 313 lb (141.976 kg)    HEENT: nl dentition, turbinates, and orophanx. Nl external ear canals without cough reflex   NECK :  without JVD/Nodes/TM/ nl carotid upstrokes bilaterally   LUNGS: no acc muscle use, clear to A and P bilaterally without cough on insp or exp maneuvers   CV:  RRR  no s3 or murmur or increase in P2, no edema   ABD:  Massively obese, soft and nontender with nl excursion in the supine position. No bruits or organomegaly, bowel sounds nl  MS:  warm without deformities, calf tenderness, cyanosis or clubbing  SKIN: warm and dry without lesions    NEURO:  alert, approp, no deficits    cxr 06/13/13 Stable mild cardiomegaly and central pulmonary vascular congestion  Venous dopplers 07/04/13 - Study was technically difficult due to patient body habitus. - No evidence of deep vein thrombosis involving the visualized veins of the right lower extremity and left lower extremity.     Assessment & Plan:

## 2013-07-09 NOTE — Patient Instructions (Addendum)
bystolic 10 mg twice daily in place of coreg  Pantoprazole (protonix) 40 mg  Take 30-60 min before first meal of the day and Pepcid 20 mg one bedtime until return to office - this is the best way to tell whether stomach acid is contributing to your problem.    GERD (REFLUX)  is an extremely common cause of respiratory symptoms, many times with no significant heartburn at all.    It can be treated with medication, but also with lifestyle changes including avoidance of late meals, excessive alcohol, smoking cessation, and avoid fatty foods, chocolate, peppermint, colas, red wine, and acidic juices such as orange juice.  NO MINT OR MENTHOL PRODUCTS SO NO COUGH DROPS  USE SUGARLESS CANDY INSTEAD (jolley ranchers or Stover's)  NO OIL BASED VITAMINS - use powdered substitutes.    Please schedule a follow up office visit in 2 weeks, sooner if needed

## 2013-07-10 ENCOUNTER — Telehealth: Payer: Self-pay | Admitting: Internal Medicine

## 2013-07-10 NOTE — Telephone Encounter (Signed)
Called, spoke with pt's mother - Pt was seen by MW yesterday.  In the pt instructions, pt was advised to take bystolic 10 mg twice daily in place of coreg.  However, on the med list, bystolic10 mg is listed to take once daily.  Dr. Melvyn Novas, will you pls clarify how often pt is to be taking the bystolic 10 mg?  Thank you.

## 2013-07-10 NOTE — Telephone Encounter (Signed)
Sorry for the confusion, my error. It's supposed to be twice daily, so change it on the med list too

## 2013-07-10 NOTE — Addendum Note (Signed)
Addended by: Virl Cagey on: 07/10/2013 02:34 PM   Modules accepted: Orders

## 2013-07-10 NOTE — Telephone Encounter (Signed)
Spoke with mother-- Aware of changes of Bystolic instructions d/t error. Changes made in EPIC med list.

## 2013-07-11 ENCOUNTER — Other Ambulatory Visit (HOSPITAL_COMMUNITY): Payer: Self-pay | Admitting: *Deleted

## 2013-07-11 DIAGNOSIS — R0600 Dyspnea, unspecified: Secondary | ICD-10-CM | POA: Insufficient documentation

## 2013-07-11 NOTE — Assessment & Plan Note (Signed)
Strongly prefer in this setting: Bystolic, the most beta -1  selective Beta blocker available in sample form, with bisoprolol the most selective generic choice  on the market.   For now try bystolic 10 mg bid

## 2013-07-11 NOTE — Assessment & Plan Note (Addendum)
Venous dopplers 07/04/13 - No evidence of deep vein thrombosis involving the visualized veins of the right lower extremity and left lower extremity.   Symptoms are markedly disproportionate to objective findings and not clear this is a lung problem but pt does appear to have difficult airway management issues.  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 Beta blocker effect > try change coreg to bystolic (discussed under hbp)  ? CHF / vol overload due to CRI a major concern here also   Extended discussion re dx of PE and problem with CRI / false pos/neg v/q in that even if we rule it out today (which I don't believe is necessary based on hx) , it's still a risk tomorrow and every day after due to obesity and immobility > this places her also at risk of gerd (which may be why she developed the "wheezing " post op and places her also at risk of post op atx  For now therefore would postpone any elective surgery and see if we can get her back to baseline level of doe

## 2013-07-14 ENCOUNTER — Encounter (HOSPITAL_COMMUNITY)
Admission: RE | Admit: 2013-07-14 | Discharge: 2013-07-14 | Disposition: A | Discharge status: Home / Self Care | Payer: BC Managed Care – PPO | Source: Ambulatory Visit | Attending: Nephrology | Admitting: Nephrology

## 2013-07-14 VITALS — BP 157/90 | Temp 98.1°F | Resp 24

## 2013-07-14 DIAGNOSIS — N179 Acute kidney failure, unspecified: Secondary | ICD-10-CM

## 2013-07-14 DIAGNOSIS — D649 Anemia, unspecified: Secondary | ICD-10-CM

## 2013-07-14 LAB — BASIC METABOLIC PANEL
BUN: 61 mg/dL — ABNORMAL HIGH (ref 6–23)
GFR calc Af Amer: 12 mL/min — ABNORMAL LOW (ref >90)
GFR calc non Af Amer: 10 mL/min — ABNORMAL LOW (ref >90)
Potassium: 4 mEq/L (ref 3.5–5.1)

## 2013-07-14 LAB — POCT HEMOGLOBIN-HEMACUE: Hemoglobin: 10.8 g/dL — ABNORMAL LOW (ref 12.0–15.0)

## 2013-07-14 MED ORDER — DARBEPOETIN ALFA-POLYSORBATE 200 MCG/0.4ML IJ SOLN
INTRAMUSCULAR | Status: AC
  Administered 2013-07-14: 200 ug via SUBCUTANEOUS
  Filled 2013-07-14: qty 0.4

## 2013-07-14 MED ORDER — DARBEPOETIN ALFA-POLYSORBATE 200 MCG/0.4ML IJ SOLN: 200.0000 ug | INTRAMUSCULAR | Status: DC

## 2013-07-16 ENCOUNTER — Encounter (HOSPITAL_COMMUNITY): Payer: Self-pay | Admitting: Emergency Medicine

## 2013-07-16 ENCOUNTER — Inpatient Hospital Stay (HOSPITAL_COMMUNITY)
Admission: EM | Admit: 2013-07-16 | Discharge: 2013-07-30 | DRG: 550 | Disposition: A | Discharge status: Home Health | Payer: BC Managed Care – PPO | Attending: Internal Medicine | Admitting: Internal Medicine

## 2013-07-16 ENCOUNTER — Emergency Department (HOSPITAL_COMMUNITY): Payer: BC Managed Care – PPO

## 2013-07-16 ENCOUNTER — Telehealth: Payer: Self-pay | Admitting: Internal Medicine

## 2013-07-16 DIAGNOSIS — E039 Hypothyroidism, unspecified: Secondary | ICD-10-CM | POA: Diagnosis present

## 2013-07-16 DIAGNOSIS — G4733 Obstructive sleep apnea (adult) (pediatric): Secondary | ICD-10-CM

## 2013-07-16 DIAGNOSIS — E872 Acidosis, unspecified: Secondary | ICD-10-CM

## 2013-07-16 DIAGNOSIS — N1330 Unspecified hydronephrosis: Secondary | ICD-10-CM

## 2013-07-16 DIAGNOSIS — Z872 Personal history of diseases of the skin and subcutaneous tissue: Secondary | ICD-10-CM

## 2013-07-16 DIAGNOSIS — N183 Chronic kidney disease, stage 3 unspecified: Secondary | ICD-10-CM

## 2013-07-16 DIAGNOSIS — Z79899 Other long term (current) drug therapy: Secondary | ICD-10-CM

## 2013-07-16 DIAGNOSIS — Z9119 Patient's noncompliance with other medical treatment and regimen: Secondary | ICD-10-CM

## 2013-07-16 DIAGNOSIS — Z833 Family history of diabetes mellitus: Secondary | ICD-10-CM

## 2013-07-16 DIAGNOSIS — D649 Anemia, unspecified: Secondary | ICD-10-CM

## 2013-07-16 DIAGNOSIS — I2789 Other specified pulmonary heart diseases: Secondary | ICD-10-CM | POA: Diagnosis present

## 2013-07-16 DIAGNOSIS — I5020 Unspecified systolic (congestive) heart failure: Secondary | ICD-10-CM

## 2013-07-16 DIAGNOSIS — Z8249 Family history of ischemic heart disease and other diseases of the circulatory system: Secondary | ICD-10-CM

## 2013-07-16 DIAGNOSIS — N179 Acute kidney failure, unspecified: Secondary | ICD-10-CM

## 2013-07-16 DIAGNOSIS — Z7982 Long term (current) use of aspirin: Secondary | ICD-10-CM

## 2013-07-16 DIAGNOSIS — F329 Major depressive disorder, single episode, unspecified: Secondary | ICD-10-CM | POA: Diagnosis present

## 2013-07-16 DIAGNOSIS — Z881 Allergy status to other antibiotic agents status: Secondary | ICD-10-CM

## 2013-07-16 DIAGNOSIS — K5900 Constipation, unspecified: Secondary | ICD-10-CM | POA: Diagnosis not present

## 2013-07-16 DIAGNOSIS — K219 Gastro-esophageal reflux disease without esophagitis: Secondary | ICD-10-CM

## 2013-07-16 DIAGNOSIS — L8990 Pressure ulcer of unspecified site, unspecified stage: Secondary | ICD-10-CM | POA: Diagnosis present

## 2013-07-16 DIAGNOSIS — L732 Hidradenitis suppurativa: Secondary | ICD-10-CM

## 2013-07-16 DIAGNOSIS — Z91199 Patient's noncompliance with other medical treatment and regimen due to unspecified reason: Secondary | ICD-10-CM

## 2013-07-16 DIAGNOSIS — I5032 Chronic diastolic (congestive) heart failure: Secondary | ICD-10-CM | POA: Diagnosis present

## 2013-07-16 DIAGNOSIS — N390 Urinary tract infection, site not specified: Secondary | ICD-10-CM

## 2013-07-16 DIAGNOSIS — H9190 Unspecified hearing loss, unspecified ear: Secondary | ICD-10-CM | POA: Diagnosis present

## 2013-07-16 DIAGNOSIS — I5043 Acute on chronic combined systolic (congestive) and diastolic (congestive) heart failure: Secondary | ICD-10-CM | POA: Diagnosis present

## 2013-07-16 DIAGNOSIS — Z87891 Personal history of nicotine dependence: Secondary | ICD-10-CM

## 2013-07-16 DIAGNOSIS — N824 Other female intestinal-genital tract fistulae: Secondary | ICD-10-CM | POA: Diagnosis present

## 2013-07-16 DIAGNOSIS — Z6841 Body Mass Index (BMI) 40.0 and over, adult: Secondary | ICD-10-CM

## 2013-07-16 DIAGNOSIS — N200 Calculus of kidney: Secondary | ICD-10-CM

## 2013-07-16 DIAGNOSIS — R0600 Dyspnea, unspecified: Secondary | ICD-10-CM | POA: Diagnosis present

## 2013-07-16 DIAGNOSIS — F0630 Mood disorder due to known physiological condition, unspecified: Secondary | ICD-10-CM

## 2013-07-16 DIAGNOSIS — I10 Essential (primary) hypertension: Secondary | ICD-10-CM | POA: Diagnosis present

## 2013-07-16 DIAGNOSIS — L89109 Pressure ulcer of unspecified part of back, unspecified stage: Secondary | ICD-10-CM | POA: Diagnosis present

## 2013-07-16 DIAGNOSIS — Z1639 Resistance to other specified antimicrobial drug: Secondary | ICD-10-CM | POA: Diagnosis present

## 2013-07-16 DIAGNOSIS — E662 Morbid (severe) obesity with alveolar hypoventilation: Secondary | ICD-10-CM | POA: Diagnosis present

## 2013-07-16 DIAGNOSIS — E042 Nontoxic multinodular goiter: Secondary | ICD-10-CM

## 2013-07-16 DIAGNOSIS — IMO0001 Reserved for inherently not codable concepts without codable children: Principal | ICD-10-CM | POA: Diagnosis present

## 2013-07-16 DIAGNOSIS — B952 Enterococcus as the cause of diseases classified elsewhere: Secondary | ICD-10-CM | POA: Diagnosis present

## 2013-07-16 DIAGNOSIS — E7800 Pure hypercholesterolemia, unspecified: Secondary | ICD-10-CM | POA: Diagnosis present

## 2013-07-16 DIAGNOSIS — D72829 Elevated white blood cell count, unspecified: Secondary | ICD-10-CM

## 2013-07-16 DIAGNOSIS — J9811 Atelectasis: Secondary | ICD-10-CM

## 2013-07-16 DIAGNOSIS — I5030 Unspecified diastolic (congestive) heart failure: Secondary | ICD-10-CM

## 2013-07-16 DIAGNOSIS — R0602 Shortness of breath: Secondary | ICD-10-CM

## 2013-07-16 DIAGNOSIS — E119 Type 2 diabetes mellitus without complications: Secondary | ICD-10-CM

## 2013-07-16 DIAGNOSIS — A419 Sepsis, unspecified organism: Secondary | ICD-10-CM

## 2013-07-16 DIAGNOSIS — I509 Heart failure, unspecified: Secondary | ICD-10-CM | POA: Diagnosis present

## 2013-07-16 DIAGNOSIS — N185 Chronic kidney disease, stage 5: Secondary | ICD-10-CM

## 2013-07-16 DIAGNOSIS — I5023 Acute on chronic systolic (congestive) heart failure: Secondary | ICD-10-CM

## 2013-07-16 DIAGNOSIS — D631 Anemia in chronic kidney disease: Secondary | ICD-10-CM | POA: Diagnosis present

## 2013-07-16 DIAGNOSIS — J45909 Unspecified asthma, uncomplicated: Secondary | ICD-10-CM | POA: Diagnosis present

## 2013-07-16 HISTORY — DX: Gastro-esophageal reflux disease without esophagitis: K21.9

## 2013-07-16 LAB — BASIC METABOLIC PANEL
BUN: 61 mg/dL — ABNORMAL HIGH (ref 6–23)
Creatinine, Ser: 4.74 mg/dL — ABNORMAL HIGH (ref 0.50–1.10)
GFR calc Af Amer: 11 mL/min — ABNORMAL LOW (ref >90)
GFR calc non Af Amer: 10 mL/min — ABNORMAL LOW (ref >90)
Potassium: 5.3 mEq/L — ABNORMAL HIGH (ref 3.5–5.1)

## 2013-07-16 LAB — CBC
HCT: 37.8 % (ref 36.0–46.0)
Hemoglobin: 11.2 g/dL — ABNORMAL LOW (ref 12.0–15.0)
MCV: 91.5 fL (ref 78.0–100.0)
Platelets: 225 10*3/uL (ref 150–400)
RBC: 4.13 MIL/uL (ref 3.87–5.11)
WBC: 14.5 10*3/uL — ABNORMAL HIGH (ref 4.0–10.5)

## 2013-07-16 MED ORDER — MAGNESIUM OXIDE 400 MG PO TABS: 400.0000 mg | ORAL_TABLET | Freq: Two times a day (BID) | ORAL | Status: DC

## 2013-07-16 MED ORDER — SODIUM BICARBONATE 650 MG PO TABS
650.0000 mg | ORAL_TABLET | Freq: Two times a day (BID) | ORAL | Status: DC
  Administered 2013-07-17 – 2013-07-25 (×18): 650 mg via ORAL
  Filled 2013-07-16 (×23): qty 1

## 2013-07-16 MED ORDER — SIMVASTATIN 20 MG PO TABS
20.0000 mg | ORAL_TABLET | Freq: Every day | ORAL | Status: DC
  Administered 2013-07-17 – 2013-07-29 (×12): 20 mg via ORAL
  Filled 2013-07-16 (×15): qty 1

## 2013-07-16 MED ORDER — MORPHINE SULFATE 2 MG/ML IJ SOLN
2.0000 mg | INTRAMUSCULAR | Status: DC | PRN
  Administered 2013-07-18 – 2013-07-28 (×2): 2 mg via INTRAVENOUS
  Filled 2013-07-16 (×2): qty 1

## 2013-07-16 MED ORDER — FERROUS SULFATE 325 (65 FE) MG PO TABS
325.0000 mg | ORAL_TABLET | Freq: Two times a day (BID) | ORAL | Status: DC
  Administered 2013-07-17 – 2013-07-30 (×27): 325 mg via ORAL
  Filled 2013-07-16 (×31): qty 1

## 2013-07-16 MED ORDER — DULOXETINE HCL 60 MG PO CPEP
60.0000 mg | ORAL_CAPSULE | Freq: Every day | ORAL | Status: DC
  Administered 2013-07-17 – 2013-07-30 (×13): 60 mg via ORAL
  Filled 2013-07-16 (×15): qty 1

## 2013-07-16 MED ORDER — ASPIRIN EC 81 MG PO TBEC
81.0000 mg | DELAYED_RELEASE_TABLET | Freq: Every morning | ORAL | Status: DC
  Administered 2013-07-17 – 2013-07-30 (×13): 81 mg via ORAL
  Filled 2013-07-16 (×15): qty 1

## 2013-07-16 MED ORDER — CALCIUM CARBONATE 600 MG PO TABS
600.0000 mg | ORAL_TABLET | Freq: Two times a day (BID) | ORAL | Status: DC
  Filled 2013-07-16: qty 1

## 2013-07-16 MED ORDER — HEPARIN SODIUM (PORCINE) 5000 UNIT/ML IJ SOLN
5000.0000 [IU] | Freq: Three times a day (TID) | INTRAMUSCULAR | Status: DC
  Administered 2013-07-17 – 2013-07-30 (×40): 5000 [IU] via SUBCUTANEOUS
  Filled 2013-07-16 (×44): qty 1

## 2013-07-16 MED ORDER — ZINC SULFATE 220 (50 ZN) MG PO CAPS
220.0000 mg | ORAL_CAPSULE | Freq: Every day | ORAL | Status: DC
  Administered 2013-07-17 – 2013-07-27 (×10): 220 mg via ORAL
  Filled 2013-07-16 (×12): qty 1

## 2013-07-16 MED ORDER — SODIUM CHLORIDE 0.9 % IJ SOLN
3.0000 mL | Freq: Two times a day (BID) | INTRAMUSCULAR | Status: DC
Start: 2013-07-16 — End: 2013-07-30
  Administered 2013-07-17 – 2013-07-30 (×28): 3 mL via INTRAVENOUS

## 2013-07-16 MED ORDER — FOLIC ACID 1 MG PO TABS
1.0000 mg | ORAL_TABLET | Freq: Every day | ORAL | Status: DC
  Administered 2013-07-17 – 2013-07-27 (×10): 1 mg via ORAL
  Filled 2013-07-16 (×13): qty 1

## 2013-07-16 MED ORDER — SACCHAROMYCES BOULARDII 250 MG PO CAPS
250.0000 mg | ORAL_CAPSULE | Freq: Two times a day (BID) | ORAL | Status: DC
  Administered 2013-07-17 – 2013-07-30 (×27): 250 mg via ORAL
  Filled 2013-07-16 (×31): qty 1

## 2013-07-16 MED ORDER — HYDROCODONE-ACETAMINOPHEN 5-325 MG PO TABS
1.0000 | ORAL_TABLET | Freq: Once | ORAL | Status: AC
  Administered 2013-07-16: 1 via ORAL
  Filled 2013-07-16: qty 1

## 2013-07-16 MED ORDER — INSULIN ASPART 100 UNIT/ML ~~LOC~~ SOLN: 0.0000 [IU] | Freq: Every day | SUBCUTANEOUS | Status: DC

## 2013-07-16 MED ORDER — FAMOTIDINE 20 MG PO TABS
20.0000 mg | ORAL_TABLET | Freq: Every day | ORAL | Status: DC
  Administered 2013-07-17 – 2013-07-29 (×14): 20 mg via ORAL
  Filled 2013-07-16 (×17): qty 1

## 2013-07-16 MED ORDER — CALCITRIOL 0.25 MCG PO CAPS
0.2500 ug | ORAL_CAPSULE | Freq: Every day | ORAL | Status: DC
  Administered 2013-07-17 – 2013-07-30 (×13): 0.25 ug via ORAL
  Filled 2013-07-16 (×15): qty 1

## 2013-07-16 MED ORDER — SENNOSIDES-DOCUSATE SODIUM 8.6-50 MG PO TABS
1.0000 | ORAL_TABLET | Freq: Two times a day (BID) | ORAL | Status: DC
  Administered 2013-07-17 – 2013-07-30 (×26): 1 via ORAL
  Filled 2013-07-16 (×30): qty 1

## 2013-07-16 MED ORDER — FUROSEMIDE 10 MG/ML IJ SOLN
60.0000 mg | Freq: Once | INTRAMUSCULAR | Status: AC
  Administered 2013-07-16: 60 mg via INTRAVENOUS
  Filled 2013-07-16: qty 6

## 2013-07-16 MED ORDER — SODIUM CHLORIDE 0.9 % IR SOLN: 1000.0000 mL | Freq: Three times a day (TID) | Status: DC

## 2013-07-16 MED ORDER — NEBIVOLOL HCL 10 MG PO TABS
10.0000 mg | ORAL_TABLET | Freq: Two times a day (BID) | ORAL | Status: DC
  Administered 2013-07-17 – 2013-07-30 (×26): 10 mg via ORAL
  Filled 2013-07-16 (×32): qty 1

## 2013-07-16 MED ORDER — MAGNESIUM OXIDE 400 (241.3 MG) MG PO TABS
400.0000 mg | ORAL_TABLET | Freq: Two times a day (BID) | ORAL | Status: DC
  Administered 2013-07-17 – 2013-07-25 (×18): 400 mg via ORAL
  Filled 2013-07-16 (×21): qty 1

## 2013-07-16 MED ORDER — ISOSORBIDE MONONITRATE ER 30 MG PO TB24
30.0000 mg | ORAL_TABLET | Freq: Every morning | ORAL | Status: DC
  Administered 2013-07-17 – 2013-07-30 (×13): 30 mg via ORAL
  Filled 2013-07-16 (×15): qty 1

## 2013-07-16 MED ORDER — LEVOTHYROXINE SODIUM 25 MCG PO TABS
25.0000 ug | ORAL_TABLET | Freq: Every day | ORAL | Status: DC
  Administered 2013-07-17 – 2013-07-30 (×14): 25 ug via ORAL
  Filled 2013-07-16 (×17): qty 1

## 2013-07-16 MED ORDER — INSULIN ASPART 100 UNIT/ML ~~LOC~~ SOLN
0.0000 [IU] | Freq: Three times a day (TID) | SUBCUTANEOUS | Status: DC
  Administered 2013-07-18 – 2013-07-24 (×7): 2 [IU] via SUBCUTANEOUS
  Administered 2013-07-25: 3 [IU] via SUBCUTANEOUS
  Administered 2013-07-26 (×2): 2 [IU] via SUBCUTANEOUS
  Administered 2013-07-27: 3 [IU] via SUBCUTANEOUS
  Administered 2013-07-28 – 2013-07-29 (×2): 2 [IU] via SUBCUTANEOUS

## 2013-07-16 MED ORDER — HYDRALAZINE HCL 25 MG PO TABS
25.0000 mg | ORAL_TABLET | Freq: Three times a day (TID) | ORAL | Status: DC
  Administered 2013-07-17 – 2013-07-28 (×35): 25 mg via ORAL
  Filled 2013-07-16 (×40): qty 1

## 2013-07-16 MED ORDER — FUROSEMIDE 10 MG/ML IJ SOLN
60.0000 mg | Freq: Two times a day (BID) | INTRAMUSCULAR | Status: DC
  Administered 2013-07-17: 60 mg via INTRAVENOUS
  Filled 2013-07-16 (×3): qty 6

## 2013-07-16 MED ORDER — CYCLOBENZAPRINE HCL 10 MG PO TABS
10.0000 mg | ORAL_TABLET | Freq: Three times a day (TID) | ORAL | Status: DC | PRN
  Administered 2013-07-19: 10 mg via ORAL
  Filled 2013-07-16: qty 1

## 2013-07-16 NOTE — Telephone Encounter (Signed)
I spoke with pt mother. She stated they just pulled up into [facility] ED. Her breathing is was very bad and could not walk d.t being SOB. I advised will forward to MW as an fyi.

## 2013-07-16 NOTE — H&P (Signed)
[facility] History and Physical  [name] [id number] DOB: 05/22/1961    PCP:   [name], MD   Chief Complaint:  Increase shortness of breath for the past four days, with significant bilateral pedal edema.  HPI: [name] is an 52 y.o. female with multiple medical problems including DM, morbid obesity, asthma, CHF (Epic said systolic, but EF 60 percent, so likely diastolic), CKD 3, Hypothyroidism presents to the ER With 3 days hx of increase shortness of breath, chest tightness, and some orthopnea.  She was having some wheezing, and had seen her pulmonologist, who tx her for possible GERD.  She denied fever, chills, or coughs.  But  Admitted to have some orthopnea.  Her legs have been swelling more, but she has no calf tenderness.  Work up in the ER included a CXR which showed cardiomegaly and borderline CHF.  Her WBC was 14K, and her Cr is 4.4. Hospitalist was asked to admit her for increased shortness of breath.  Rewiew of Systems:  Constitutional: Negative for malaise, fever and chills. No significant weight loss or weight gain Eyes: Negative for eye pain, redness and discharge, diplopia, visual changes, or flashes of light. ENMT: Negative for ear pain, hoarseness, nasal congestion, sinus pressure and sore throat. No headaches; tinnitus, drooling, or problem swallowing. Cardiovascular: Negative for chest pain, palpitations, diaphoresis, ; No PND Respiratory: Negative for cough, hemoptysis, wheezing and stridor. No pleuritic chestpain. Gastrointestinal: Negative for nausea, vomiting, diarrhea, constipation, abdominal pain, melena, blood in stool, hematemesis, jaundice and rectal bleeding.    Genitourinary: Negative for frequency, dysuria, incontinence,flank pain and hematuria; Musculoskeletal: Negative for back pain and neck pain. Negative for trauma.;  Skin: . Negative for pruritus, rash, abrasions, bruising and skin lesion.; ulcerations Neuro: Negative for  headache, lightheadedness and neck stiffness. Negative for weakness, altered level of consciousness , altered mental status, extremity weakness, burning feet, involuntary movement, seizure and syncope.  Psych: negative for anxiety, depression, insomnia, tearfulness, panic attacks, hallucinations, paranoia, suicidal or homicidal ideation    Past Medical History  Diagnosis Date  . Systolic congestive heart failure   . SOB (shortness of breath)   . HTN (hypertension)   . Iron deficiency anemia   . Morbid obesity   . Hyperkalemia   . Hypothyroidism   . CHF (congestive heart failure)   . Hydradenitis   . Pneumonia Nov 10, 2012  . DM type 2 (diabetes mellitus, type 2)     diet controlled  . History of hemodialysis dec 2013    none currently  . Urinary tract infection     taking antibiotics for 3 days prior to surgery  . Chronic kidney disease (CKD), stage III (moderate)     [name] nephrology lov note 05-12-2013 on pt chart    Past Surgical History  Procedure Laterality Date  . Portacath placement    . Cystoscopy w/ ureteral stent placement  05/28/2012    Procedure: CYSTOSCOPY WITH RETROGRADE PYELOGRAM/URETERAL STENT PLACEMENT;  Surgeon: Reece Packer, MD;  Location: [facility];  Service: Urology;  Laterality: Left;  . Cystoscopy w/ retrogrades  11/16/2012    Procedure: CYSTOSCOPY WITH RETROGRADE PYELOGRAM;  Surgeon: Reece Packer, MD;  Location: [facility];  Service: Urology;  Laterality: Bilateral;  CYSTOSCOPY,BILATERAL RETROGRADE PYELOGRAM/ REMOVAL LEFT URETERAL STENT/ FULGERATION BLADDER MUCOSA/ INSERTION RIGHT URETERAL STENT  . Port-a-cath removal    . Cystoscopy with retrograde pyelogram, ureteroscopy and stent placement Right 06/23/2013    Procedure: CYSTOSCOPY WITH RIGHT URETEROSCOPY, RIGHT  RETROGRADE  PYELOGRAM, WITH LASER LIPOTRIPSY AND RIGHT URETERAL STENT EXCHANGE ;  Surgeon: Molli Hazard, MD;  Location: [facility];  Service: Urology;  Laterality: Right;  STENT  EXCHANGE    . Holmium laser application N/A [id number]    Procedure: HOLMIUM LASER APPLICATION;  Surgeon: Molli Hazard, MD;  Location: [facility];  Service: Urology;  Laterality: N/A;    Medications:  HOME MEDS: Prior to Admission medications   Medication Sig Start Date End Date Taking? Authorizing Provider  aspirin EC 81 MG tablet Take 81 mg by mouth every morning.    Yes Historical Provider, MD  calcitRIOL (ROCALTROL) 0.25 MCG capsule Take 1 capsule (0.25 mcg total) by mouth daily. 12/03/12  Yes Janece Canterbury, MD  calcium carbonate (OS-CAL) 600 MG TABS Take 600 mg by mouth 2 (two) times daily with a meal.   Yes Historical Provider, MD  cyclobenzaprine (FLEXERIL) 10 MG tablet Take 1 tablet (10 mg total) by mouth 3 (three) times daily as needed for muscle spasms. For Spasms 12/03/12  Yes Janece Canterbury, MD  DULoxetine (CYMBALTA) 60 MG capsule Take 1 capsule (60 mg total) by mouth daily. 12/03/12  Yes Janece Canterbury, MD  famotidine (PEPCID) 20 MG tablet One at bedtime 07/09/13  Yes Tanda Rockers, MD  feeding supplement (RESOURCE BREEZE) LIQD Take 1 Container by mouth 2 (two) times daily between meals.   Yes Historical Provider, MD  ferrous sulfate 325 (65 FE) MG tablet Take 1 tablet (325 mg total) by mouth 2 (two) times daily. 12/03/12  Yes Janece Canterbury, MD  folic acid (FOLVITE) 1 MG tablet Take 1 tablet (1 mg total) by mouth daily. 12/03/12  Yes Janece Canterbury, MD  furosemide (LASIX) 80 MG tablet Take 80 mg by mouth every other day.   Yes Historical Provider, MD  hyoscyamine (LEVSIN, ANASPAZ) 0.125 MG tablet Take 1 tablet (0.125 mg total) by mouth every 4 (four) hours as needed for cramping (bladder spasms). 06/23/13  Yes Molli Hazard, MD  isosorbide mononitrate (IMDUR) 30 MG 24 hr tablet Take 30 mg by mouth every morning.   Yes Historical Provider, MD  levothyroxine (SYNTHROID, LEVOTHROID) 25 MCG tablet Take 1 tablet (25 mcg total) by mouth every morning. Take on an empty stomach  12/03/12  Yes Janece Canterbury, MD  magnesium oxide (MAG-OX) 400 MG tablet Take 400 mg by mouth 2 (two) times daily.   Yes Historical Provider, MD  nebivolol (BYSTOLIC) 10 MG tablet Take 10 mg by mouth 2 (two) times daily. 07/09/13  Yes Tanda Rockers, MD  oxyCODONE-acetaminophen (PERCOCET/ROXICET) 5-325 MG per tablet Take 1-2 tablets by mouth every 4 (four) hours as needed for pain. 06/23/13  Yes Molli Hazard, MD  pantoprazole (PROTONIX) 40 MG tablet Take 1 tablet (40 mg total) by mouth daily. Take 30-60 min before first meal of the day 07/09/13  Yes Tanda Rockers, MD  pravastatin (PRAVACHOL) 40 MG tablet Take 1 tablet (40 mg total) by mouth daily. 12/03/12  Yes Janece Canterbury, MD  saccharomyces boulardii (FLORASTOR) 250 MG capsule Take 1 capsule (250 mg total) by mouth 2 (two) times daily. 08/23/12  Yes Domenic Polite, MD  senna-docusate (SENOKOT S) 8.6-50 MG per tablet Take 1 tablet by mouth 2 (two) times daily. 06/23/13  Yes Molli Hazard, MD  sodium bicarbonate 650 MG tablet Take 1 tablet (650 mg total) by mouth 2 (two) times daily. 01/29/13  Yes Modena Jansky, MD  sodium hypochlorite (DAKIN'S 1/4 STRENGTH) 0.125 % SOLN Irrigate with  1 application as directed daily as needed. Apply to wound with dressing changes   Yes Historical Provider, MD  terbinafine (LAMISIL) 1 % cream Apply 1 application topically daily. Applies under stomach 12/03/12  Yes Janece Canterbury, MD  vitamin C (ASCORBIC ACID) 500 MG tablet Take 500 mg by mouth daily.   Yes Historical Provider, MD  zinc sulfate 220 MG capsule Take 220 mg by mouth daily.   Yes Historical Provider, MD     Allergies:  Allergies  Allergen Reactions  . Amoxicillin-Pot Clavulanate Diarrhea  . Rosiglitazone Maleate     REACTION: swelling  . Amoxicillin Rash    Tolerated Zosyn 01/2013.  [place]    Social History:   reports that she quit smoking about 33 years ago. Her smoking use included Cigarettes. She has a .25 pack-year smoking history.  She has never used smokeless tobacco. She reports that she does not drink alcohol or use illicit drugs.  Family History: Family History  Problem Relation Age of Onset  . Coronary artery disease Mother   . Hypertension Mother   . Diabetes type II Mother   . Malignant hyperthermia Mother   . Coronary artery disease Father   . Hypertension Father   . Malignant hyperthermia Father   . Cancer Maternal Grandfather     ? Type  . Kidney failure Maternal Grandmother      Physical Exam: Filed Vitals:   07/16/13 2000 07/16/13 2015 07/16/13 2030 07/16/13 2214  BP: 135/60 121/43 118/43 121/69  Pulse: 57 57 58 54  Temp:    97.6 F (36.4 C)  TempSrc:    Oral  Resp:    22  Weight:    168.5 kg (371 lb 7.6 oz)  SpO2: 100% 98% 98% 100%   Blood pressure 121/69, pulse 54, temperature 97.6 F (36.4 C), temperature source Oral, resp. rate 22, weight 168.5 kg (371 lb 7.6 oz), SpO2 100.00%.  GEN:  Pleasant  patient lying in the stretcher in no acute distress; cooperative with exam. PSYCH:  alert and oriented x4; does not appear anxious or depressed; affect is appropriate. HEENT: Mucous membranes pink and anicteric; PERRLA; EOM intact; no cervical lymphadenopathy nor thyromegaly or carotid bruit; no JVD; There were no stridor. Neck is very supple. Breasts:: Not examined CHEST WALL: No tenderness CHEST: Normal respiration, clear to auscultation bilaterally.  HEART: Regular rate and rhythm.  There are no murmur, rub, or gallops.   BACK: No kyphosis or scoliosis; no CVA tenderness ABDOMEN: soft and non-tender; no masses, no organomegaly, normal abdominal bowel sounds; no pannus; no intertriginous candida. There is no rebound and no distention. Rectal Exam: Not done EXTREMITIES: No bone or joint deformity; age-appropriate arthropathy of the hands and knees; 2-3+ edema.  no ulcerations.  There is no calf tenderness. Genitalia: not examined PULSES: 2+ and symmetric SKIN: Normal hydration no rash or  ulceration CNS: Cranial nerves 2-12 grossly intact no focal lateralizing neurologic deficit.  Speech is fluent; uvula elevated with phonation, facial symmetry and tongue midline. DTR are normal bilaterally, cerebella exam is intact, barbinski is negative and strengths are equaled bilaterally.  No sensory loss.   Labs on Admission:  Basic Metabolic Panel:  Recent Labs Lab 07/14/13 1415 07/16/13 1522 07/17/13 0020  NA 139 141  --   K 4.0 5.3*  --   CL 107 109  --   CO2 20 19  --   GLUCOSE 108* 127*  --   BUN 61* 61*  --   CREATININE 4.65*  4.74* 4.83*  CALCIUM 8.4 8.4  --    Liver Function Tests: No results found for this basename: AST, ALT, ALKPHOS, BILITOT, PROT, ALBUMIN,  in the last 168 hours No results found for this basename: LIPASE, AMYLASE,  in the last 168 hours No results found for this basename: AMMONIA,  in the last 168 hours CBC:  Recent Labs Lab 07/14/13 1409 07/16/13 1740 07/17/13 0020  WBC  --  14.5* 12.1*  HGB 10.8* 11.2* 10.4*  HCT  --  37.8 37.0  MCV  --  91.5 93.0  PLT  --  225 217   Cardiac Enzymes:  Recent Labs Lab 07/16/13 1522 07/17/13 0020  TROPONINI <0.30 <0.30    CBG:  Recent Labs Lab 07/16/13 2243  GLUCAP 122*     Radiological Exams on Admission: Dg Chest 2 View  07/16/2013   *RADIOLOGY REPORT*  Clinical Data: Cough with shortness of breath and congestion for 2 weeks.  CHEST - 2 VIEW  Comparison: 06/13/2013.  Findings: Cardiomegaly.  Borderline congestive heart failure/pulmonary vascular congestion.  No effusion or pneumothorax.  No focal infiltrates.  Mild subsegmental atelectasis.  Similar appearance to priors.  IMPRESSION: Cardiomegaly with borderline congestive heart failure.   Original Report Authenticated By: Rolla Flatten, M.D.   Assessment/Plan Present on Admission:  . Diastolic CHF . DM type 2 (diabetes mellitus, type 2) . GERD . Morbid obesity . Renal failure (ARF), acute on chronic . SOB (shortness of breath) .  Hypertension  PLAN:  I suspect that she has acute on chronic diastolic CHF.  She did have recent ECHO with EF of 60%.  She has already been on nitrate, lasix, and beta-blocker.  I will add hydralazine to her regimen.  Will need to diurese her more despite elevated Cr with CKD. Her shortness of breath is clearly multifactorial including morbid obesity, CHF, asthma, decondition, and possibly having pulmonary HTN as well.  I wonder if she also has sleep apnea.  For her Dm, will continue with carb modified diet and SSI.  She is stable, but with major serious medical problems.  I will admit her to telemetry under [facility] service.  Thank you for allowing me to participate in her care.   Other plans as per orders.  Code Status:FULL CODE.   Orvan Falconer, MD. [facility] Pager [phone] 7pm to 7am.  07/17/2013, 1:28 AM

## 2013-07-16 NOTE — ED Notes (Signed)
Pt SOB.  Cannot speak in full sentences. Audible expiratory wheeze.

## 2013-07-16 NOTE — ED Provider Notes (Signed)
CSN: [id number]     Arrival date & time 07/16/13  1453 History     First MD Initiated Contact with Patient 07/16/13 1507     Chief Complaint  Patient presents with  . Shortness of Breath   (Consider location/radiation/quality/duration/timing/severity/associated sxs/prior Treatment) HPI 52 year old female with CKD, HTN, and Systolic CHF (last Echo Dec. 2013 revealed normal EF of 60-65%, dilated RV with decreased systolic function) presents with SOB.  She has been experiencing increasing shortness of breath over the past 3 weeks.  No inciting factors of the patient knows of.  She endorses compliance with her medications.  She denies any associated fevers, chills, nausea, vomiting, chest pain, lower extremity edema.  She states that SOB has increased gradually over the past two weeks and is worse with exertion. She is followed by cardiology Dr. Radford Pax as well as [facility].    Past Medical History  Diagnosis Date  . Systolic congestive heart failure   . SOB (shortness of breath)   . HTN (hypertension)   . Iron deficiency anemia   . Morbid obesity   . Hyperkalemia   . Hypothyroidism   . CHF (congestive heart failure)   . Hydradenitis   . Pneumonia Nov 10, 2012  . DM type 2 (diabetes mellitus, type 2)     diet controlled  . History of hemodialysis dec 2013    none currently  . Urinary tract infection     taking antibiotics for 3 days prior to surgery  . Chronic kidney disease (CKD), stage III (moderate)     [name] nephrology lov note 05-12-2013 on pt chart   Past Surgical History  Procedure Laterality Date  . Portacath placement    . Cystoscopy w/ ureteral stent placement  05/28/2012    Procedure: CYSTOSCOPY WITH RETROGRADE PYELOGRAM/URETERAL STENT PLACEMENT;  Surgeon: Reece Packer, MD;  Location: [facility];  Service: Urology;  Laterality: Left;  . Cystoscopy w/ retrogrades  11/16/2012    Procedure: CYSTOSCOPY WITH RETROGRADE PYELOGRAM;  Surgeon: Reece Packer,  MD;  Location: [facility];  Service: Urology;  Laterality: Bilateral;  CYSTOSCOPY,BILATERAL RETROGRADE PYELOGRAM/ REMOVAL LEFT URETERAL STENT/ FULGERATION BLADDER MUCOSA/ INSERTION RIGHT URETERAL STENT  . Port-a-cath removal    . Cystoscopy with retrograde pyelogram, ureteroscopy and stent placement Right 06/23/2013    Procedure: CYSTOSCOPY WITH RIGHT URETEROSCOPY, RIGHT  RETROGRADE PYELOGRAM, WITH LASER LIPOTRIPSY AND RIGHT URETERAL STENT EXCHANGE ;  Surgeon: Molli Hazard, MD;  Location: [facility];  Service: Urology;  Laterality: Right;  STENT EXCHANGE    . Holmium laser application N/A [id number]    Procedure: HOLMIUM LASER APPLICATION;  Surgeon: Molli Hazard, MD;  Location: [facility];  Service: Urology;  Laterality: N/A;   Family History  Problem Relation Age of Onset  . Coronary artery disease Mother   . Hypertension Mother   . Diabetes type II Mother   . Malignant hyperthermia Mother   . Coronary artery disease Father   . Hypertension Father   . Malignant hyperthermia Father   . Cancer Maternal Grandfather     ? Type  . Kidney failure Maternal Grandmother    History  Substance Use Topics  . Smoking status: Former Smoker -- 0.25 packs/day for 1 years    Types: Cigarettes    Quit date: 11/28/1979  . Smokeless tobacco: Never Used  . Alcohol Use: No   OB History   Grav Para Term Preterm Abortions TAB SAB Ect Mult Living  Review of Systems  Constitutional: Negative for fever and chills.  Respiratory: Positive for shortness of breath. Negative for chest tightness.   Cardiovascular: Negative for chest pain and leg swelling.  Gastrointestinal: Negative for nausea, vomiting and abdominal pain.  Genitourinary: Negative.   Musculoskeletal: Negative.   Skin: Negative for rash.  Neurological: Negative.    Allergies  Amoxicillin-pot clavulanate; Rosiglitazone maleate; and Amoxicillin  Home Medications   Current Outpatient Rx  Name  Route  Sig  Dispense   Refill  . aspirin EC 81 MG tablet   Oral   Take 81 mg by mouth every morning.          . calcitRIOL (ROCALTROL) 0.25 MCG capsule   Oral   Take 1 capsule (0.25 mcg total) by mouth daily.   30 capsule   0   . calcium carbonate (OS-CAL) 600 MG TABS   Oral   Take 600 mg by mouth 2 (two) times daily with a meal.         . cyclobenzaprine (FLEXERIL) 10 MG tablet   Oral   Take 1 tablet (10 mg total) by mouth 3 (three) times daily as needed for muscle spasms. For Spasms   30 tablet   0   . DULoxetine (CYMBALTA) 60 MG capsule   Oral   Take 1 capsule (60 mg total) by mouth daily.   30 capsule   0   . famotidine (PEPCID) 20 MG tablet      One at bedtime   30 tablet   2   . feeding supplement (RESOURCE BREEZE) LIQD   Oral   Take 1 Container by mouth 2 (two) times daily between meals.         . ferrous sulfate 325 (65 FE) MG tablet   Oral   Take 1 tablet (325 mg total) by mouth 2 (two) times daily.   60 tablet   0   . folic acid (FOLVITE) 1 MG tablet   Oral   Take 1 tablet (1 mg total) by mouth daily.   30 tablet   0   . furosemide (LASIX) 80 MG tablet   Oral   Take 80 mg by mouth every other day.         . hyoscyamine (LEVSIN, ANASPAZ) 0.125 MG tablet   Oral   Take 1 tablet (0.125 mg total) by mouth every 4 (four) hours as needed for cramping (bladder spasms).   40 tablet   4   . isosorbide mononitrate (IMDUR) 30 MG 24 hr tablet   Oral   Take 30 mg by mouth every morning.         Marland Kitchen levothyroxine (SYNTHROID, LEVOTHROID) 25 MCG tablet   Oral   Take 1 tablet (25 mcg total) by mouth every morning. Take on an empty stomach   30 tablet   0   . magnesium oxide (MAG-OX) 400 MG tablet   Oral   Take 400 mg by mouth 2 (two) times daily.         . nebivolol (BYSTOLIC) 10 MG tablet   Oral   Take 10 mg by mouth 2 (two) times daily.         Marland Kitchen oxyCODONE-acetaminophen (PERCOCET/ROXICET) 5-325 MG per tablet   Oral   Take 1-2 tablets by mouth every 4  (four) hours as needed for pain.   50 tablet   0   . pantoprazole (PROTONIX) 40 MG tablet   Oral   Take 1 tablet (40 mg total)  by mouth daily. Take 30-60 min before first meal of the day   30 tablet   2   . pravastatin (PRAVACHOL) 40 MG tablet   Oral   Take 1 tablet (40 mg total) by mouth daily.   30 tablet   0   . saccharomyces boulardii (FLORASTOR) 250 MG capsule   Oral   Take 1 capsule (250 mg total) by mouth 2 (two) times daily.   30 capsule   0   . senna-docusate (SENOKOT S) 8.6-50 MG per tablet   Oral   Take 1 tablet by mouth 2 (two) times daily.   60 tablet   0   . sodium bicarbonate 650 MG tablet   Oral   Take 1 tablet (650 mg total) by mouth 2 (two) times daily.   60 tablet   0   . sodium hypochlorite (DAKIN'S 1/4 STRENGTH) 0.125 % SOLN   Irrigation   Irrigate with 1 application as directed daily as needed. Apply to wound with dressing changes         . terbinafine (LAMISIL) 1 % cream   Topical   Apply 1 application topically daily. Applies under stomach         . vitamin C (ASCORBIC ACID) 500 MG tablet   Oral   Take 500 mg by mouth daily.         Marland Kitchen zinc sulfate 220 MG capsule   Oral   Take 220 mg by mouth daily.          BP 138/65  Pulse 67  Resp 22  SpO2 88% Physical Exam  Constitutional: She is oriented to person, place, and time.  Morbidly obese African American female, lying in bed, appears in mild respiratory distress.  HENT:  Head: Normocephalic and atraumatic.  Cardiovascular: Normal rate and regular rhythm.   Heart sounds distant due to body habitus.  Pulmonary/Chest:  Lung exam difficult secondary to body habitus. Diminished breath sounds throughout.  No adventitious breath sounds appreciated.   Abdominal:  Morbidly obese. Soft nontender, nondistended.  No appreciable organomegaly  Musculoskeletal:  1+ pitting lower extremity edema  Neurological: She is alert and oriented to person, place, and time.  Skin: Skin is warm  and dry.   ED Course   Procedures (including critical care time)  EKG:  Date: 07/16/2013  Rate: 63  Rhythm: normal sinus rhythm  QRS Axis: right  Intervals: QT prolonged  ST/T Wave abnormalities: Flattened T waves throughout  Conduction Disutrbances:none  Old EKG Reviewed: T wave changes noted  Labs Reviewed  BASIC METABOLIC PANEL - Abnormal; Notable for the following:    Potassium 5.3 (*)    Glucose, Bld 127 (*)    BUN 61 (*)    Creatinine, Ser 4.74 (*)    GFR calc non Af Amer 10 (*)    GFR calc Af Amer 11 (*)    All other components within normal limits  PRO B NATRIURETIC PEPTIDE - Abnormal; Notable for the following:    Pro B Natriuretic peptide (BNP) [id number] (*)    All other components within normal limits  TROPONIN I  CBC   Dg Chest 2 View  07/16/2013   *RADIOLOGY REPORT*  Clinical Data: Cough with shortness of breath and congestion for 2 weeks.  CHEST - 2 VIEW  Comparison: 06/13/2013.  Findings: Cardiomegaly.  Borderline congestive heart failure/pulmonary vascular congestion.  No effusion or pneumothorax.  No focal infiltrates.  Mild subsegmental atelectasis.  Similar appearance to priors.  IMPRESSION:  Cardiomegaly with borderline congestive heart failure.   Original Report Authenticated By: Rolla Flatten, M.D.   1. SOB (shortness of breath)     MDM  52 year old African American female with history of systolic CHF, CKD, and hypertension who presents with worsening shortness of breath over the past 3 weeks.  - Shortness of breath likely multifactorial in nature given history of CKD, CHF, morbid obesity. - Will obtain basic labs, chest x-ray, troponin, BNP and EKG.   1700 - Chest xray revealed cardiomegaly and likely CHF.  Will given IV Lasix 60 x 1.  1740 - BMP revealed creatinine of 4.74 (2.85 on 7/18).  Patient still having SOB.   1940 - Hospitalist to admit for Acute SOB (likely CHF exacerbation) and worsening renal function.  Coral Spikes, DO 07/16/13 1940

## 2013-07-16 NOTE — ED Notes (Signed)
Has been sob x 3 weeks has seen the dr Velora Heckler pul and they changed some of her meds around but has been getting worse  Last time she saw dr was was last week

## 2013-07-16 NOTE — ED Notes (Signed)
To contact the family member ---Odis Luster on  [phone].

## 2013-07-17 ENCOUNTER — Encounter (HOSPITAL_COMMUNITY): Payer: Self-pay | Admitting: General Practice

## 2013-07-17 DIAGNOSIS — N179 Acute kidney failure, unspecified: Secondary | ICD-10-CM

## 2013-07-17 DIAGNOSIS — I10 Essential (primary) hypertension: Secondary | ICD-10-CM

## 2013-07-17 DIAGNOSIS — G4733 Obstructive sleep apnea (adult) (pediatric): Secondary | ICD-10-CM | POA: Diagnosis present

## 2013-07-17 DIAGNOSIS — R0602 Shortness of breath: Secondary | ICD-10-CM

## 2013-07-17 DIAGNOSIS — D72829 Elevated white blood cell count, unspecified: Secondary | ICD-10-CM

## 2013-07-17 DIAGNOSIS — E119 Type 2 diabetes mellitus without complications: Secondary | ICD-10-CM

## 2013-07-17 DIAGNOSIS — I5023 Acute on chronic systolic (congestive) heart failure: Secondary | ICD-10-CM

## 2013-07-17 LAB — CBC WITH DIFFERENTIAL/PLATELET
Eosinophils Absolute: 1 10*3/uL — ABNORMAL HIGH (ref 0.0–0.7)
Eosinophils Relative: 8 % — ABNORMAL HIGH (ref 0–5)
HCT: 37.1 % (ref 36.0–46.0)
Hemoglobin: 11 g/dL — ABNORMAL LOW (ref 12.0–15.0)
Lymphocytes Relative: 9 % — ABNORMAL LOW (ref 12–46)
Lymphs Abs: 1.2 10*3/uL (ref 0.7–4.0)
MCH: 27 pg (ref 26.0–34.0)
MCV: 91.2 fL (ref 78.0–100.0)
Monocytes Relative: 5 % (ref 3–12)
Platelets: 233 10*3/uL (ref 150–400)
RBC: 4.07 MIL/uL (ref 3.87–5.11)
WBC: 12.6 10*3/uL — ABNORMAL HIGH (ref 4.0–10.5)

## 2013-07-17 LAB — COMPREHENSIVE METABOLIC PANEL WITH GFR
ALT: <5 U/L (ref 0–35)
AST: 8 U/L (ref 0–37)
Albumin: 2.5 g/dL — ABNORMAL LOW (ref 3.5–5.2)
Alkaline Phosphatase: 70 U/L (ref 39–117)
BUN: 62 mg/dL — ABNORMAL HIGH (ref 6–23)
CO2: 20 meq/L (ref 19–32)
Calcium: 8.4 mg/dL (ref 8.4–10.5)
Chloride: 109 meq/L (ref 96–112)
Creatinine, Ser: 4.95 mg/dL — ABNORMAL HIGH (ref 0.50–1.10)
GFR calc Af Amer: 11 mL/min — ABNORMAL LOW
GFR calc non Af Amer: 9 mL/min — ABNORMAL LOW
Glucose, Bld: 138 mg/dL — ABNORMAL HIGH (ref 70–99)
Potassium: 4.2 meq/L (ref 3.5–5.1)
Sodium: 141 meq/L (ref 135–145)
Total Bilirubin: 0.3 mg/dL (ref 0.3–1.2)
Total Protein: 8.3 g/dL (ref 6.0–8.3)

## 2013-07-17 LAB — COMPREHENSIVE METABOLIC PANEL
ALT: <5 U/L (ref 0–35)
AST: 8 U/L (ref 0–37)
Albumin: 2.5 g/dL — ABNORMAL LOW (ref 3.5–5.2)
CO2: 20 mEq/L (ref 19–32)
Calcium: 8.4 mg/dL (ref 8.4–10.5)
Creatinine, Ser: 4.91 mg/dL — ABNORMAL HIGH (ref 0.50–1.10)
GFR calc non Af Amer: 9 mL/min — ABNORMAL LOW (ref >90)
Sodium: 142 mEq/L (ref 135–145)
Total Protein: 8.5 g/dL — ABNORMAL HIGH (ref 6.0–8.3)

## 2013-07-17 LAB — HEMOGLOBIN A1C: Mean Plasma Glucose: 117 mg/dL — ABNORMAL HIGH (ref <117)

## 2013-07-17 LAB — GLUCOSE, CAPILLARY
Glucose-Capillary: 122 mg/dL — ABNORMAL HIGH (ref 70–99)
Glucose-Capillary: 102 mg/dL — ABNORMAL HIGH (ref 70–99)
Glucose-Capillary: 115 mg/dL — ABNORMAL HIGH (ref 70–99)
Glucose-Capillary: 119 mg/dL — ABNORMAL HIGH (ref 70–99)
Glucose-Capillary: 123 mg/dL — ABNORMAL HIGH (ref 70–99)

## 2013-07-17 LAB — CREATININE, SERUM
Creatinine, Ser: 4.83 mg/dL — ABNORMAL HIGH (ref 0.50–1.10)
GFR calc non Af Amer: 10 mL/min — ABNORMAL LOW (ref >90)

## 2013-07-17 LAB — CBC
Hemoglobin: 10.4 g/dL — ABNORMAL LOW (ref 12.0–15.0)
MCHC: 28.1 g/dL — ABNORMAL LOW (ref 30.0–36.0)
Platelets: 217 10*3/uL (ref 150–400)
RBC: 3.98 MIL/uL (ref 3.87–5.11)

## 2013-07-17 LAB — TROPONIN I: Troponin I: <0.3 ng/mL (ref <0.30)

## 2013-07-17 LAB — TSH: TSH: 4.966 u[IU]/mL — ABNORMAL HIGH (ref 0.350–4.500)

## 2013-07-17 MED ORDER — LIDOCAINE HCL 2 % EX GEL
Freq: Once | CUTANEOUS | Status: DC
  Administered 2013-07-17: 11:00:00 via URETHRAL
  Filled 2013-07-17: qty 5

## 2013-07-17 MED ORDER — BOOST / RESOURCE BREEZE PO LIQD
1.0000 | ORAL | Status: DC
  Administered 2013-07-17 – 2013-07-30 (×11): 1 via ORAL

## 2013-07-17 MED ORDER — FUROSEMIDE 10 MG/ML IJ SOLN
60.0000 mg | Freq: Three times a day (TID) | INTRAMUSCULAR | Status: DC
  Administered 2013-07-17 (×3): 60 mg via INTRAVENOUS
  Filled 2013-07-17 (×4): qty 6

## 2013-07-17 MED ORDER — LIDOCAINE HCL 2 % EX GEL
Freq: Once | CUTANEOUS | Status: AC
  Administered 2013-07-17: 12:00:00 via URETHRAL
  Filled 2013-07-17: qty 5

## 2013-07-17 MED ORDER — CALCIUM CARBONATE 1250 (500 CA) MG PO TABS
1.0000 | ORAL_TABLET | Freq: Two times a day (BID) | ORAL | Status: DC
  Administered 2013-07-17 – 2013-07-30 (×27): 500 mg via ORAL
  Filled 2013-07-17 (×32): qty 1

## 2013-07-17 MED ORDER — METOLAZONE 5 MG PO TABS
5.0000 mg | ORAL_TABLET | Freq: Every day | ORAL | Status: DC
  Administered 2013-07-17: 5 mg via ORAL
  Filled 2013-07-17 (×3): qty 1

## 2013-07-17 NOTE — Progress Notes (Signed)
INITIAL NUTRITION ASSESSMENT  DOCUMENTATION CODES Per approved criteria  -Morbid Obesity   INTERVENTION: Add Resource Breeze po daily, each supplement provides 250 kcal and 9 grams of protein. RD to continue to follow nutrition care plan.  NUTRITION DIAGNOSIS: Increased nutrient needs related to wound healing as evidenced by estimated needs.   Goal: Intake to meet >90% of estimated nutrition needs.  Monitor:  weight trends, lab trends, I/O's, PO intake, supplement tolerance  Reason for Assessment: Malnutrition Screening Tool + Low Braden Score  52 y.o. female  Admitting Dx: Diastolic CHF  ASSESSMENT: PMHx significant for DM, obesity, asthma, CHF, CKD III. Admitted with SOB and leg swelling x 3 days. Work-up reveals acute on chronic diastolic CHF.  Pt with multiple open stage II wounds. Discussed nutrition intake extensively with mom at bedside. She reports that she is the most strict out of everyone in the family with the patient's food preparation. Pt has been told to follow a Low Sodium and Low Potassium diet. She is often noncompliant. Used to weigh herself daily but she doesn't anymore. Drinks 1-2 Resource Breeze supplements at home. Mom reports that she is frustrated with patient's noncompliance - RD provided emotional support and encouraged her to continue to strive for diet compliance with patient as able.  Height: Ht Readings from Last 1 Encounters:  06/13/13 5' 6.5" (1.689 m)    Weight: Wt Readings from Last 1 Encounters:  07/17/13 370 lb 2.4 oz (167.9 kg)    Ideal Body Weight: 132.5 lb  % Ideal Body Weight: 279%  Wt Readings from Last 10 Encounters:  07/17/13 370 lb 2.4 oz (167.9 kg)  06/13/13 345 lb 12.8 oz (156.854 kg)  05/26/13 339 lb (153.769 kg)  05/19/13 313 lb (141.976 kg)  03/08/13 304 lb 14.3 oz (138.3 kg)  03/03/13 295 lb (133.811 kg)  02/14/13 288 lb (130.636 kg)  01/29/13 278 lb 10.6 oz (126.4 kg)  12/02/12 344 lb 5.7 oz (156.2 kg)  12/02/12  344 lb 5.7 oz (156.2 kg)    Usual Body Weight: variable --> 288 - 370 lb within 5 months  % Usual Body Weight: 100%  BMI:  Body mass index is 58.86 kg/(m^2). Obese Class III  Estimated Nutritional Needs: Kcal: 2000 - 2200 Protein: 80 - 95 g Fluid: 1.5 - 1.8 liters daily  Skin:  Multiple small open stage II wounds  Diet Order: Renal 60-70; 1200 ml fluid restriction  EDUCATION NEEDS: -No education needs identified at this time   Intake/Output Summary (Last 24 hours) at 07/17/13 1219 Last data filed at 07/17/13 0912  Gross per 24 hour  Intake    120 ml  Output      0 ml  Net    120 ml    Last BM: 8/19  Labs:   Recent Labs Lab 07/14/13 1415 07/16/13 1522 07/17/13 0020 07/17/13 0750  NA 139 141  --  142  K 4.0 5.3*  --  4.1  CL 107 109  --  110  CO2 20 19  --  20  BUN 61* 61*  --  61*  CREATININE 4.65* 4.74* 4.83* 4.91*  CALCIUM 8.4 8.4  --  8.4  MG  --   --   --  2.2  GLUCOSE 108* 127*  --  105*    CBG (last 3)   Recent Labs  07/16/13 2243 07/17/13 0600 07/17/13 1039  GLUCAP 122* 102* 115*    Scheduled Meds: . aspirin EC  81 mg Oral q morning -  10a  . calcitRIOL  0.25 mcg Oral Daily  . calcium carbonate  1 tablet Oral BID WC  . DULoxetine  60 mg Oral Daily  . famotidine  20 mg Oral QHS  . ferrous sulfate  325 mg Oral BID  . folic acid  1 mg Oral Daily  . furosemide  60 mg Intravenous TID  . heparin  5,000 Units Subcutaneous Q8H  . hydrALAZINE  25 mg Oral Q8H  . insulin aspart  0-15 Units Subcutaneous TID WC  . insulin aspart  0-5 Units Subcutaneous QHS  . isosorbide mononitrate  30 mg Oral q morning - 10a  . levothyroxine  25 mcg Oral QAC breakfast  . lidocaine   Urethral Once  . magnesium oxide  400 mg Oral BID  . metolazone  5 mg Oral Daily  . nebivolol  10 mg Oral BID  . saccharomyces boulardii  250 mg Oral BID  . senna-docusate  1 tablet Oral BID  . simvastatin  20 mg Oral q1800  . sodium bicarbonate  650 mg Oral BID  . sodium  chloride  3 mL Intravenous Q12H  . sodium chloride irrigation  1,000 mL Irrigation Q8H  . zinc sulfate  220 mg Oral Daily    Continuous Infusions:   Past Medical History  Diagnosis Date  . Systolic congestive heart failure   . SOB (shortness of breath)   . HTN (hypertension)   . Iron deficiency anemia   . Morbid obesity   . Hyperkalemia   . Hypothyroidism   . CHF (congestive heart failure)   . Hydradenitis   . Pneumonia Nov 10, 2012  . DM type 2 (diabetes mellitus, type 2)     diet controlled  . History of hemodialysis dec 2013    none currently  . Urinary tract infection     taking antibiotics for 3 days prior to surgery  . Chronic kidney disease (CKD), stage III (moderate)     [name] nephrology lov note 05-12-2013 on pt chart    Past Surgical History  Procedure Laterality Date  . Portacath placement    . Cystoscopy w/ ureteral stent placement  05/28/2012    Procedure: CYSTOSCOPY WITH RETROGRADE PYELOGRAM/URETERAL STENT PLACEMENT;  Surgeon: Reece Packer, MD;  Location: [facility];  Service: Urology;  Laterality: Left;  . Cystoscopy w/ retrogrades  11/16/2012    Procedure: CYSTOSCOPY WITH RETROGRADE PYELOGRAM;  Surgeon: Reece Packer, MD;  Location: [facility];  Service: Urology;  Laterality: Bilateral;  CYSTOSCOPY,BILATERAL RETROGRADE PYELOGRAM/ REMOVAL LEFT URETERAL STENT/ FULGERATION BLADDER MUCOSA/ INSERTION RIGHT URETERAL STENT  . Port-a-cath removal    . Cystoscopy with retrograde pyelogram, ureteroscopy and stent placement Right 06/23/2013    Procedure: CYSTOSCOPY WITH RIGHT URETEROSCOPY, RIGHT  RETROGRADE PYELOGRAM, WITH LASER LIPOTRIPSY AND RIGHT URETERAL STENT EXCHANGE ;  Surgeon: Molli Hazard, MD;  Location: [facility];  Service: Urology;  Laterality: Right;  STENT EXCHANGE    . Holmium laser application N/A [id number]    Procedure: HOLMIUM LASER APPLICATION;  Surgeon: Molli Hazard, MD;  Location: [facility];  Service: Urology;  Laterality: N/A;     Inda Coke MS, RD, LDN Pager: [phone] After-hours pager: [phone]

## 2013-07-17 NOTE — Progress Notes (Signed)
  Echocardiogram 2D Echocardiogram has been performed.  Success, [facility] 07/17/2013, 11:28 AM

## 2013-07-17 NOTE — ED Provider Notes (Signed)
I saw and evaluated the patient, reviewed the resident's note and I agree with the findings and plan. Pt with several weeks of progressive SOB with exertion. CXR with cardiomegaly and borderline CHF. Elevated Cr. Will admit to hospitalist.   Julianne Rice, MD 07/17/13 1704

## 2013-07-17 NOTE — Progress Notes (Signed)
Utilization review completed. Katriana Dortch, RN, BSN. 

## 2013-07-17 NOTE — Progress Notes (Signed)
[facility] History and Physical  [name] [id number] DOB: [birth year]    PCP:   [name], MD   Chief Complaint:  Increase shortness of breath for the past four days, with significant bilateral pedal edema.  HPI: [name] is an 52 y.o. BF PMHx HTN, OSA, DM, morbid obesity, asthma, CHF (Epic said systolic, but EF 60 percent, so likely diastolic), CKD 3, Hypothyroidism presented to the ER With 3 days hx of increase shortness of breath, chest tightness, and some orthopnea.  She was having some wheezing, and had seen her pulmonologist, who tx her for possible GERD.  She denied fever, chills, or coughs. But Admitted to have some orthopnea.  Her legs have been swelling more, but she has no calf tenderness.  Work up in the ER included a CXR which showed cardiomegaly and borderline CHF.  Her WBC was 14K, and her Cr is 4.4., ProBNP on admission= 29,655.    Past Medical History  Diagnosis Date  . Systolic congestive heart failure   . SOB (shortness of breath)   . HTN (hypertension)   . Iron deficiency anemia   . Morbid obesity   . Hyperkalemia   . Hypothyroidism   . CHF (congestive heart failure)   . Hydradenitis   . Pneumonia Nov 10, 2012  . DM type 2 (diabetes mellitus, type 2)     diet controlled  . History of hemodialysis dec 2013    none currently  . Urinary tract infection     taking antibiotics for 3 days prior to surgery  . Chronic kidney disease (CKD), stage III (moderate)     [name] nephrology lov note 05-12-2013 on pt chart    Past Surgical History  Procedure Laterality Date  . Portacath placement    . Cystoscopy w/ ureteral stent placement  05/28/2012    Procedure: CYSTOSCOPY WITH RETROGRADE PYELOGRAM/URETERAL STENT PLACEMENT;  Surgeon: Reece Packer, MD;  Location: [facility];  Service: Urology;  Laterality: Left;  . Cystoscopy w/ retrogrades  11/16/2012    Procedure: CYSTOSCOPY WITH RETROGRADE PYELOGRAM;  Surgeon: Reece Packer, MD;   Location: [facility];  Service: Urology;  Laterality: Bilateral;  CYSTOSCOPY,BILATERAL RETROGRADE PYELOGRAM/ REMOVAL LEFT URETERAL STENT/ FULGERATION BLADDER MUCOSA/ INSERTION RIGHT URETERAL STENT  . Port-a-cath removal    . Cystoscopy with retrograde pyelogram, ureteroscopy and stent placement Right 06/23/2013    Procedure: CYSTOSCOPY WITH RIGHT URETEROSCOPY, RIGHT  RETROGRADE PYELOGRAM, WITH LASER LIPOTRIPSY AND RIGHT URETERAL STENT EXCHANGE ;  Surgeon: Molli Hazard, MD;  Location: [facility];  Service: Urology;  Laterality: Right;  STENT EXCHANGE    . Holmium laser application N/A [id number]    Procedure: HOLMIUM LASER APPLICATION;  Surgeon: Molli Hazard, MD;  Location: [facility];  Service: Urology;  Laterality: N/A;    Medications:  HOME MEDS: Prior to Admission medications   Medication Sig Start Date End Date Taking? Authorizing Provider  aspirin EC 81 MG tablet Take 81 mg by mouth every morning.    Yes Historical Provider, MD  calcitRIOL (ROCALTROL) 0.25 MCG capsule Take 1 capsule (0.25 mcg total) by mouth daily. 12/03/12  Yes Janece Canterbury, MD  calcium carbonate (OS-CAL) 600 MG TABS Take 600 mg by mouth 2 (two) times daily with a meal.   Yes Historical Provider, MD  cyclobenzaprine (FLEXERIL) 10 MG tablet Take 1 tablet (10 mg total) by mouth 3 (three) times daily as needed for muscle spasms. For Spasms 12/03/12  Yes Janece Canterbury, MD  DULoxetine (CYMBALTA) 60  MG capsule Take 1 capsule (60 mg total) by mouth daily. 12/03/12  Yes Janece Canterbury, MD  famotidine (PEPCID) 20 MG tablet One at bedtime 07/09/13  Yes Tanda Rockers, MD  feeding supplement (RESOURCE BREEZE) LIQD Take 1 Container by mouth 2 (two) times daily between meals.   Yes Historical Provider, MD  ferrous sulfate 325 (65 FE) MG tablet Take 1 tablet (325 mg total) by mouth 2 (two) times daily. 12/03/12  Yes Janece Canterbury, MD  folic acid (FOLVITE) 1 MG tablet Take 1 tablet (1 mg total) by mouth daily. 12/03/12  Yes Janece Canterbury, MD  furosemide (LASIX) 80 MG tablet Take 80 mg by mouth every other day.   Yes Historical Provider, MD  hyoscyamine (LEVSIN, ANASPAZ) 0.125 MG tablet Take 1 tablet (0.125 mg total) by mouth every 4 (four) hours as needed for cramping (bladder spasms). 06/23/13  Yes Molli Hazard, MD  isosorbide mononitrate (IMDUR) 30 MG 24 hr tablet Take 30 mg by mouth every morning.   Yes Historical Provider, MD  levothyroxine (SYNTHROID, LEVOTHROID) 25 MCG tablet Take 1 tablet (25 mcg total) by mouth every morning. Take on an empty stomach 12/03/12  Yes Janece Canterbury, MD  magnesium oxide (MAG-OX) 400 MG tablet Take 400 mg by mouth 2 (two) times daily.   Yes Historical Provider, MD  nebivolol (BYSTOLIC) 10 MG tablet Take 10 mg by mouth 2 (two) times daily. 07/09/13  Yes Tanda Rockers, MD  oxyCODONE-acetaminophen (PERCOCET/ROXICET) 5-325 MG per tablet Take 1-2 tablets by mouth every 4 (four) hours as needed for pain. 06/23/13  Yes Molli Hazard, MD  pantoprazole (PROTONIX) 40 MG tablet Take 1 tablet (40 mg total) by mouth daily. Take 30-60 min before first meal of the day 07/09/13  Yes Tanda Rockers, MD  pravastatin (PRAVACHOL) 40 MG tablet Take 1 tablet (40 mg total) by mouth daily. 12/03/12  Yes Janece Canterbury, MD  saccharomyces boulardii (FLORASTOR) 250 MG capsule Take 1 capsule (250 mg total) by mouth 2 (two) times daily. 08/23/12  Yes Domenic Polite, MD  senna-docusate (SENOKOT S) 8.6-50 MG per tablet Take 1 tablet by mouth 2 (two) times daily. 06/23/13  Yes Molli Hazard, MD  sodium bicarbonate 650 MG tablet Take 1 tablet (650 mg total) by mouth 2 (two) times daily. 01/29/13  Yes Modena Jansky, MD  sodium hypochlorite (DAKIN'S 1/4 STRENGTH) 0.125 % SOLN Irrigate with 1 application as directed daily as needed. Apply to wound with dressing changes   Yes Historical Provider, MD  terbinafine (LAMISIL) 1 % cream Apply 1 application topically daily. Applies under stomach 12/03/12  Yes Janece Canterbury, MD  vitamin C (ASCORBIC ACID) 500 MG tablet Take 500 mg by mouth daily.   Yes Historical Provider, MD  zinc sulfate 220 MG capsule Take 220 mg by mouth daily.   Yes Historical Provider, MD     Allergies:  Allergies  Allergen Reactions  . Amoxicillin-Pot Clavulanate Diarrhea  . Rosiglitazone Maleate     REACTION: swelling  . Amoxicillin Rash    Tolerated Zosyn 01/2013.  [place]    Social History:   reports that she quit smoking about 33 years ago. Her smoking use included Cigarettes. She has a .25 pack-year smoking history. She has never used smokeless tobacco. She reports that she does not drink alcohol or use illicit drugs.  Family History: Family History  Problem Relation Age of Onset  . Coronary artery disease Mother   . Hypertension Mother   .  Diabetes type II Mother   . Malignant hyperthermia Mother   . Coronary artery disease Father   . Hypertension Father   . Malignant hyperthermia Father   . Cancer Maternal Grandfather     ? Type  . Kidney failure Maternal Grandmother      Physical Exam: Filed Vitals:   07/16/13 2015 07/16/13 2030 07/16/13 2214 07/17/13 0500  BP: 121/43 118/43 121/69 128/74  Pulse: 57 58 54 56  Temp:   97.6 F (36.4 C) 97.9 F (36.6 C)  TempSrc:   Oral Oral  Resp:   22 22  Weight:   168.5 kg (371 lb 7.6 oz) 168.7 kg (371 lb 14.7 oz)  SpO2: 98% 98% 100% 97%   Blood pressure 128/74, pulse 56, temperature 97.9 F (36.6 C), temperature source Oral, resp. rate 22, weight 168.7 kg (371 lb 14.7 oz), SpO2 97.00%.  GEN:  Pleasant, asleep but arousable PSYCH:  alert and oriented x4;  Breasts:: Not examined HEART: Regular rate and rhythm.  There are no murmur, rub, or gallops. DP/PT + 1 bilateral ABDOMEN: soft and non-tender; no masses, no organomegaly, normal abdominal bowel sounds;  CNS: Cranial nerves 2-12 grossly intact     Labs on Admission:  Basic Metabolic Panel:  Recent Labs Lab 07/14/13 1415 07/16/13 1522 07/17/13 0020  NA  139 141  --   K 4.0 5.3*  --   CL 107 109  --   CO2 20 19  --   GLUCOSE 108* 127*  --   BUN 61* 61*  --   CREATININE 4.65* 4.74* 4.83*  CALCIUM 8.4 8.4  --    Liver Function Tests: No results found for this basename: AST, ALT, ALKPHOS, BILITOT, PROT, ALBUMIN,  in the last 168 hours No results found for this basename: LIPASE, AMYLASE,  in the last 168 hours No results found for this basename: AMMONIA,  in the last 168 hours CBC:  Recent Labs Lab 07/14/13 1409 07/16/13 1740 07/17/13 0020  WBC  --  14.5* 12.1*  HGB 10.8* 11.2* 10.4*  HCT  --  37.8 37.0  MCV  --  91.5 93.0  PLT  --  225 217   Cardiac Enzymes:  Recent Labs Lab 07/16/13 1522 07/17/13 0020  TROPONINI <0.30 <0.30    CBG:  Recent Labs Lab 07/16/13 2243 07/17/13 0600  GLUCAP 122* 102*     Radiological Exams on Admission: Dg Chest 2 View  07/16/2013   *RADIOLOGY REPORT*  Clinical Data: Cough with shortness of breath and congestion for 2 weeks.  CHEST - 2 VIEW  Comparison: 06/13/2013.  Findings: Cardiomegaly.  Borderline congestive heart failure/pulmonary vascular congestion.  No effusion or pneumothorax.  No focal infiltrates.  Mild subsegmental atelectasis.  Similar appearance to priors.  IMPRESSION: Cardiomegaly with borderline congestive heart failure.   Original Report Authenticated By: Rolla Flatten, M.D.   Assessment/Plan Present on Admission:  . Diastolic CHF . DM type 2 (diabetes mellitus, type 2) . GERD . Morbid obesity . Renal failure (ARF), acute on chronic . SOB (shortness of breath) . Hypertension  PLAN: 1. CHF; Inc Lasix to 60 mg 3 times a day+ Zaroxolyn daily 30 minutes prior to Lasix dose  2. DM2; obtain hemoglobin A1c --Continue SSI at moderate  3. HTN; within ADA guidelines no change in medication  4. Acute renal failure; will continue to monitor closely secondary to her CHF and requirement to diuresis patient; recheck CMP at 1600 today  5. SOB; most likely related to #1 and  #  4  6. Leukocytosis; obtain blood cultures, urine cultures  7. OSA; patient unsure why she's not on CPAP at home Will have respiratory place patient on CPAP while in the hospital        Code Status:FULL CODE.   Allie Bossier, MD. [facility] Pager [phone] 7pm to 7am.  07/17/2013, 7:10 AM

## 2013-07-18 DIAGNOSIS — N390 Urinary tract infection, site not specified: Secondary | ICD-10-CM | POA: Diagnosis present

## 2013-07-18 DIAGNOSIS — I509 Heart failure, unspecified: Secondary | ICD-10-CM

## 2013-07-18 DIAGNOSIS — L732 Hidradenitis suppurativa: Secondary | ICD-10-CM

## 2013-07-18 DIAGNOSIS — N189 Chronic kidney disease, unspecified: Secondary | ICD-10-CM

## 2013-07-18 DIAGNOSIS — I5030 Unspecified diastolic (congestive) heart failure: Secondary | ICD-10-CM

## 2013-07-18 LAB — URINALYSIS, ROUTINE W REFLEX MICROSCOPIC
Glucose, UA: NEGATIVE mg/dL
Specific Gravity, Urine: 1.012 (ref 1.005–1.030)
Urobilinogen, UA: 0.2 mg/dL (ref 0.0–1.0)
pH: 5 (ref 5.0–8.0)

## 2013-07-18 LAB — BASIC METABOLIC PANEL
BUN: 62 mg/dL — ABNORMAL HIGH (ref 6–23)
Chloride: 107 mEq/L (ref 96–112)
GFR calc non Af Amer: 9 mL/min — ABNORMAL LOW (ref >90)
Glucose, Bld: 116 mg/dL — ABNORMAL HIGH (ref 70–99)
Potassium: 4.3 mEq/L (ref 3.5–5.1)

## 2013-07-18 LAB — CBC WITH DIFFERENTIAL/PLATELET
Basophils Relative: 0 % (ref 0–1)
Eosinophils Absolute: 0.7 10*3/uL (ref 0.0–0.7)
Eosinophils Relative: 6 % — ABNORMAL HIGH (ref 0–5)
Lymphs Abs: 0.9 10*3/uL (ref 0.7–4.0)
MCH: 26.8 pg (ref 26.0–34.0)
MCHC: 28.8 g/dL — ABNORMAL LOW (ref 30.0–36.0)
MCV: 93.1 fL (ref 78.0–100.0)
Platelets: 207 10*3/uL (ref 150–400)
RBC: 4.03 MIL/uL (ref 3.87–5.11)
RDW: 18.5 % — ABNORMAL HIGH (ref 11.5–15.5)

## 2013-07-18 LAB — GLUCOSE, CAPILLARY
Glucose-Capillary: 112 mg/dL — ABNORMAL HIGH (ref 70–99)
Glucose-Capillary: 143 mg/dL — ABNORMAL HIGH (ref 70–99)

## 2013-07-18 LAB — COMPREHENSIVE METABOLIC PANEL
ALT: <5 U/L (ref 0–35)
AST: 9 U/L (ref 0–37)
Alkaline Phosphatase: 71 U/L (ref 39–117)
CO2: 21 mEq/L (ref 19–32)
Chloride: 109 mEq/L (ref 96–112)
GFR calc non Af Amer: 9 mL/min — ABNORMAL LOW (ref >90)
Potassium: 4.2 mEq/L (ref 3.5–5.1)
Sodium: 142 mEq/L (ref 135–145)
Total Bilirubin: 0.3 mg/dL (ref 0.3–1.2)

## 2013-07-18 LAB — URINE MICROSCOPIC-ADD ON

## 2013-07-18 MED ORDER — CIPROFLOXACIN IN D5W 400 MG/200ML IV SOLN
400.0000 mg | INTRAVENOUS | Status: DC
  Administered 2013-07-18: 400 mg via INTRAVENOUS
  Filled 2013-07-18 (×2): qty 200

## 2013-07-18 MED ORDER — FUROSEMIDE 10 MG/ML IJ SOLN
80.0000 mg | Freq: Two times a day (BID) | INTRAMUSCULAR | Status: DC
  Administered 2013-07-18 – 2013-07-19 (×2): 80 mg via INTRAVENOUS
  Filled 2013-07-18 (×2): qty 8

## 2013-07-18 NOTE — Consult Note (Signed)
I have seen and examined this patient and agree with plan as outlined by Dr. Eula Fried.  Pt is well known to me from previous hospitalizations.  She has a 3 week h/o increasing lower ext edema, noncompliance with sodium and fluid restriction, and has not been weighing herself on a regular basis.  Her clinical presentation is c/w cardiorenal syndrome and has h/o right-sided CHF with similar presentations.  Will cont with IV diuresis and follow UOP and daily Scr.  Her creatinine will likely worsen before we start to see an improvement.  Cont to follow.  No indication for HD at this time. [name] A,MD 07/18/2013 5:36 PM

## 2013-07-18 NOTE — Progress Notes (Signed)
PT Cancellation Note  Patient Details Name: [name] MRN: [id number] DOB: 1961/07/29   Cancelled Treatment:    Reason Eval/Treat Not Completed: Pain limiting ability to participate;Patient declined, no reason specified.  Will return tomorrow for PT evaluation   Despina Pole 07/18/2013, 5:43 PM Carita Pian. Sanjuana Kava, [place] Pager [phone]

## 2013-07-18 NOTE — Progress Notes (Signed)
Pt is alert and oriented x4. Pt is bilaterally HOH. Pt seen by wound care RN this morning. Pt is starting to complain of pain in her bilateral legs. Pt given morphine 2 mg PRN. Pt is now sleeping in bed. Pt is to work with PT and OT today.  Pt is on 4L [place].

## 2013-07-18 NOTE — Consult Note (Addendum)
WOC consult Note Reason for Consult: Consult requested for multiple wounds to buttocks.  Pt has hydradenitis, which is a complex skin condition sometimes requiring surgical intervention.  Pt states this is a chronic condition and she uses pads to absorb drainage.  Perirectal area with multiple full thickness draining lesions and hard indurated skin changes with large amt thick tan drainage and strong foul odor. It is difficult to communicate with her at this time since she states her hearing aid is not working and she cannot hear the questions being asked.  She is familiar to [facility] team and I believe she has been followed in the past at [facility] for this complex medical condition. This is beyond [facility] scope of practice.  Please refer to [facility] team if aggressive plan of care is desired. Abd fold red and macerated with partial thickness wounds; appearance consistent with moisture associated skin damage.  This has been appropriately managed with barrier cream by the bedside nurses.  Please re-consult if further assistance is needed.  Thank-you,  Julien Girt MSN, [place], [place], [place], [place]

## 2013-07-18 NOTE — Progress Notes (Signed)
[facility] Progress note  [name] [id number] DOB: 10-14-1961    PCP:   [name], MD   Chief Complaint:  Increase shortness of breath for the past four days, with significant bilateral pedal edema.  HPI: [name] is an 52 y.o. BF PMHx HTN, OSA, DM, morbid obesity, asthma, CHF (Epic said systolic, but EF 60 percent, so likely diastolic), CKD 3, Hypothyroidism presented to the ER With 3 days hx of increase shortness of breath, chest tightness, and some orthopnea.  She was having some wheezing, and had seen her pulmonologist, who tx her for possible GERD.  She denied fever, chills, or coughs. But Admitted to have some orthopnea.  Her legs have been swelling more, but she has no calf tenderness.  Work up in the ER included a CXR which showed cardiomegaly and borderline CHF.  Her WBC was 14K, and her Cr is 4.4., Current creatinine= 5.09,  ProBNP on admission= 29,655.   Procedure Urine culture; pending UA; c/w UTI Blood culture x2; in NTD  Echocardiogram 07/17/2013 Left ventricle: The cavity size was moderately dilated. Wall thickness was increased in a pattern of mild LVH. Systolic function was normal. LVEF= 50% to 55%.  Features are consistent with a pseudonormal left ventricular filling pattern, with concomitant abnormal relaxation and increased filling pressure (grade 2 diastolic dysfunction). - Mitral valve: Mild regurgitation. - Left atrium: The atrium was mildly dilated. - Right ventricle: The cavity size was mildly dilated. Wall thickness was normal. - Pulmonary arteries: Systolic pressure was mildly increased. PA peak pressure: 70mm Hg (S).    Antibiotic Ciprofloxacin 8/22 day 1     Past Medical History  Diagnosis Date  . Systolic congestive heart failure   . SOB (shortness of breath)   . HTN (hypertension)   . Iron deficiency anemia   . Morbid obesity   . Hyperkalemia   . Hypothyroidism   . CHF (congestive heart failure)   . Hydradenitis    . Pneumonia Nov 10, 2012  . DM type 2 (diabetes mellitus, type 2)     diet controlled  . History of hemodialysis dec 2013    none currently  . Urinary tract infection     taking antibiotics for 3 days prior to surgery  . Chronic kidney disease (CKD), stage III (moderate)     [name] nephrology lov note 05-12-2013 on pt chart  . GERD (gastroesophageal reflux disease)     Past Surgical History  Procedure Laterality Date  . Portacath placement    . Cystoscopy w/ ureteral stent placement  05/28/2012    Procedure: CYSTOSCOPY WITH RETROGRADE PYELOGRAM/URETERAL STENT PLACEMENT;  Surgeon: Reece Packer, MD;  Location: [facility];  Service: Urology;  Laterality: Left;  . Cystoscopy w/ retrogrades  11/16/2012    Procedure: CYSTOSCOPY WITH RETROGRADE PYELOGRAM;  Surgeon: Reece Packer, MD;  Location: [facility];  Service: Urology;  Laterality: Bilateral;  CYSTOSCOPY,BILATERAL RETROGRADE PYELOGRAM/ REMOVAL LEFT URETERAL STENT/ FULGERATION BLADDER MUCOSA/ INSERTION RIGHT URETERAL STENT  . Port-a-cath removal    . Cystoscopy with retrograde pyelogram, ureteroscopy and stent placement Right 06/23/2013    Procedure: CYSTOSCOPY WITH RIGHT URETEROSCOPY, RIGHT  RETROGRADE PYELOGRAM, WITH LASER LIPOTRIPSY AND RIGHT URETERAL STENT EXCHANGE ;  Surgeon: Molli Hazard, MD;  Location: [facility];  Service: Urology;  Laterality: Right;  STENT EXCHANGE    . Holmium laser application N/A [id number]    Procedure: HOLMIUM LASER APPLICATION;  Surgeon: Molli Hazard, MD;  Location: [facility];  Service: Urology;  Laterality: N/A;    Medications:  HOME MEDS: Prior to Admission medications   Medication Sig Start Date End Date Taking? Authorizing Provider  aspirin EC 81 MG tablet Take 81 mg by mouth every morning.    Yes Historical Provider, MD  calcitRIOL (ROCALTROL) 0.25 MCG capsule Take 1 capsule (0.25 mcg total) by mouth daily. 12/03/12  Yes Janece Canterbury, MD  calcium carbonate (OS-CAL) 600 MG TABS Take  600 mg by mouth 2 (two) times daily with a meal.   Yes Historical Provider, MD  cyclobenzaprine (FLEXERIL) 10 MG tablet Take 1 tablet (10 mg total) by mouth 3 (three) times daily as needed for muscle spasms. For Spasms 12/03/12  Yes Janece Canterbury, MD  DULoxetine (CYMBALTA) 60 MG capsule Take 1 capsule (60 mg total) by mouth daily. 12/03/12  Yes Janece Canterbury, MD  famotidine (PEPCID) 20 MG tablet One at bedtime 07/09/13  Yes Tanda Rockers, MD  feeding supplement (RESOURCE BREEZE) LIQD Take 1 Container by mouth 2 (two) times daily between meals.   Yes Historical Provider, MD  ferrous sulfate 325 (65 FE) MG tablet Take 1 tablet (325 mg total) by mouth 2 (two) times daily. 12/03/12  Yes Janece Canterbury, MD  folic acid (FOLVITE) 1 MG tablet Take 1 tablet (1 mg total) by mouth daily. 12/03/12  Yes Janece Canterbury, MD  furosemide (LASIX) 80 MG tablet Take 80 mg by mouth every other day.   Yes Historical Provider, MD  hyoscyamine (LEVSIN, ANASPAZ) 0.125 MG tablet Take 1 tablet (0.125 mg total) by mouth every 4 (four) hours as needed for cramping (bladder spasms). 06/23/13  Yes Molli Hazard, MD  isosorbide mononitrate (IMDUR) 30 MG 24 hr tablet Take 30 mg by mouth every morning.   Yes Historical Provider, MD  levothyroxine (SYNTHROID, LEVOTHROID) 25 MCG tablet Take 1 tablet (25 mcg total) by mouth every morning. Take on an empty stomach 12/03/12  Yes Janece Canterbury, MD  magnesium oxide (MAG-OX) 400 MG tablet Take 400 mg by mouth 2 (two) times daily.   Yes Historical Provider, MD  nebivolol (BYSTOLIC) 10 MG tablet Take 10 mg by mouth 2 (two) times daily. 07/09/13  Yes Tanda Rockers, MD  oxyCODONE-acetaminophen (PERCOCET/ROXICET) 5-325 MG per tablet Take 1-2 tablets by mouth every 4 (four) hours as needed for pain. 06/23/13  Yes Molli Hazard, MD  pantoprazole (PROTONIX) 40 MG tablet Take 1 tablet (40 mg total) by mouth daily. Take 30-60 min before first meal of the day 07/09/13  Yes Tanda Rockers, MD   pravastatin (PRAVACHOL) 40 MG tablet Take 1 tablet (40 mg total) by mouth daily. 12/03/12  Yes Janece Canterbury, MD  saccharomyces boulardii (FLORASTOR) 250 MG capsule Take 1 capsule (250 mg total) by mouth 2 (two) times daily. 08/23/12  Yes Domenic Polite, MD  senna-docusate (SENOKOT S) 8.6-50 MG per tablet Take 1 tablet by mouth 2 (two) times daily. 06/23/13  Yes Molli Hazard, MD  sodium bicarbonate 650 MG tablet Take 1 tablet (650 mg total) by mouth 2 (two) times daily. 01/29/13  Yes Modena Jansky, MD  sodium hypochlorite (DAKIN'S 1/4 STRENGTH) 0.125 % SOLN Irrigate with 1 application as directed daily as needed. Apply to wound with dressing changes   Yes Historical Provider, MD  terbinafine (LAMISIL) 1 % cream Apply 1 application topically daily. Applies under stomach 12/03/12  Yes Janece Canterbury, MD  vitamin C (ASCORBIC ACID) 500 MG tablet Take 500 mg by mouth daily.   Yes Historical Provider, MD  zinc sulfate 220 MG capsule Take 220 mg by mouth daily.   Yes Historical Provider, MD     Allergies:  Allergies  Allergen Reactions  . Amoxicillin-Pot Clavulanate Diarrhea  . Rosiglitazone Maleate     REACTION: swelling  . Amoxicillin Rash    Tolerated Zosyn 01/2013.  [place]    Social History:   reports that she quit smoking about 33 years ago. Her smoking use included Cigarettes. She has a .25 pack-year smoking history. She has never used smokeless tobacco. She reports that she does not drink alcohol or use illicit drugs.  Family History: Family History  Problem Relation Age of Onset  . Coronary artery disease Mother   . Hypertension Mother   . Diabetes type II Mother   . Malignant hyperthermia Mother   . Coronary artery disease Father   . Hypertension Father   . Malignant hyperthermia Father   . Cancer Maternal Grandfather     ? Type  . Kidney failure Maternal Grandmother      Physical Exam: Filed Vitals:   07/17/13 1300 07/17/13 2101 07/18/13 0637 07/18/13 0733  BP:  141/58 144/55 119/66   Pulse: 57 55 60   Temp: 97.7 F (36.5 C) 97.8 F (36.6 C) 98 F (36.7 C)   TempSrc: Oral Oral Oral   Resp: 20 20 22    Weight:    167.3 kg (368 lb 13.3 oz)  SpO2:  98% 93%    Blood pressure 119/66, pulse 60, temperature 98 F (36.7 C), temperature source Oral, resp. rate 22, weight 167.3 kg (368 lb 13.3 oz), SpO2 93.00%.  GEN:  Pleasant, asleep but arousable PSYCH:  alert and oriented x4;  Breasts:: Not examined HEART: Regular rate and rhythm.  There are no murmur, rub, or gallops. DP/PT + 1 bilateral Respiratory; Course Breath sounds diffusely ABDOMEN: soft and non-tender; no masses, no organomegaly, normal abdominal bowel sounds;  CNS: Cranial nerves 2-12 grossly intact  Musculoskeletal; bilateral pedal edema +3-4 to mid thigh    Labs on Admission:  Basic Metabolic Panel:  Recent Labs Lab 07/14/13 1415 07/16/13 1522 07/17/13 0020 07/17/13 0750 07/17/13 1534 07/18/13 0500  NA 139 141  --  142 141 142  K 4.0 5.3*  --  4.1 4.2 4.2  CL 107 109  --  110 109 109  CO2 20 19  --  20 20 21   GLUCOSE 108* 127*  --  105* 138* 121*  BUN 61* 61*  --  61* 62* 63*  CREATININE 4.65* 4.74* 4.83* 4.91* 4.95* 5.09*  CALCIUM 8.4 8.4  --  8.4 8.4 8.4  MG  --   --   --  2.2  --  2.2   Liver Function Tests:  Recent Labs Lab 07/17/13 0750 07/17/13 1534 07/18/13 0500  AST 8 8 9   ALT <5 <5 <5  ALKPHOS 70 70 71  BILITOT 0.3 0.3 0.3  PROT 8.5* 8.3 8.5*  ALBUMIN 2.5* 2.5* 2.6*   No results found for this basename: LIPASE, AMYLASE,  in the last 168 hours No results found for this basename: AMMONIA,  in the last 168 hours CBC:  Recent Labs Lab 07/14/13 1409 07/16/13 1740 07/17/13 0020 07/17/13 0750 07/18/13 0500  WBC  --  14.5* 12.1* 12.6* 12.4*  NEUTROABS  --   --   --  9.8* 10.0*  HGB 10.8* 11.2* 10.4* 11.0* 10.8*  HCT  --  37.8 37.0 37.1 37.5  MCV  --  91.5 93.0 91.2 93.1  PLT  --  225 217 233 207   Cardiac Enzymes:  Recent Labs Lab  07/16/13 1522 07/17/13 0020 07/17/13 0645 07/17/13 1527  TROPONINI <0.30 <0.30 <0.30 <0.30    CBG:  Recent Labs Lab 07/16/13 2243 07/17/13 0600 07/17/13 1039 07/17/13 1555 07/17/13 2121  GLUCAP 122* 102* 115* 119* 123*     Radiological Exams on Admission: Dg Chest 2 View  07/16/2013   *RADIOLOGY REPORT*  Clinical Data: Cough with shortness of breath and congestion for 2 weeks.  CHEST - 2 VIEW  Comparison: 06/13/2013.  Findings: Cardiomegaly.  Borderline congestive heart failure/pulmonary vascular congestion.  No effusion or pneumothorax.  No focal infiltrates.  Mild subsegmental atelectasis.  Similar appearance to priors.  IMPRESSION: Cardiomegaly with borderline congestive heart failure.   Original Report Authenticated By: Rolla Flatten, M.D.   Assessment/Plan Present on Admission:  . Diastolic CHF . DM type 2 (diabetes mellitus, type 2) . GERD . Morbid obesity . Renal failure (ARF), acute on chronic . SOB (shortness of breath) . Hypertension . OSA (obstructive sleep apnea)  PLAN: 1. CHF; secondary to increasing renal failure DC'd Lasix and Zaroxolyn   2. DM2; obtain hemoglobin A1c --Continue SSI at moderate  3. HTN; within ADA guidelines no change in medication  4. Acute renal failure; will continue to monitor closely secondary to her CHF and requirement to diuresis patient; patient's renal failure worsening with diabetics will DC Lasix and Zaroxolyn. Consult nephrology (spoke with Dr. Colbert Coyer [facility]). Restart diuretics vs CVVD vs HD?    5. SOB; most likely related to #1 and #4  6. Leukocytosis; blood cultures, urine cultures collected, after consulting with pharmacy will start patient on Cipro 400 mg IV daily  7. OSA; patient unsure why she's not on CPAP at home Will have respiratory place patient on CPAP while in the hospital  8. hidradenitis suppurativa; patient will multiple areas of abscess on Mons pubis, bilateral buttocks cheeks, and inner  thighs where frank pus can be expressed. --obtained material for culture ---Pt on Cipro dy1 ---Discussed w/ Pt and Family that i/o to deal w/ underlying source further surgery would be required. STATED THEY WOULD TALK IT OVER   9. Vaginal/rectal fistula; telephone consult with Dr. Harolyn Rutherford (OB/GYN), who is willing to see patient as an outpatient. Would like [place] to stabilize patient and have her followup and her clinic where they have the proper equipment to evaluate        Code Status:FULL CODE.   Allie Bossier, MD. [facility] Pager [phone] 7pm to 7am.  07/18/2013, 7:52 AM

## 2013-07-18 NOTE — Consult Note (Signed)
[facility] CONSULT NOTE    Date: 07/18/2013                  Patient Name:  [name]  MRN: [id number]  DOB: November 06, 1961  Age / Sex: 52 y.o., female         PCP: [name], MD                 Service Requesting Consult: [facility]                 Reason for Consult: Acute on chronic renal failure            History of Present Illness: [name] is a morbidly obese African American  52 y.o. female with a PMHx of CKD stage 4 (follows with Dr. Florene Glen), CHF EF 99991111 grade 2 diastolic dysfunction, Cdiff positive (01/2013), HTN, DM, asthma, nephrolithiasis s/p b/l stents and recent R ureteral stent placement 06/23/13, iron deficiency anemia, and hidradenitis suppurativa who was admitted to [facility] on 07/16/2013 for evaluation of worsening DOE and orthopnea.  Cr on admission was 4.74 with unclear baseline ?~3. And increased to 5.09 today.  She is also noted to have leukocytosis with likely UTI and ?wound infection now on ciprofloxacin and acute on chronic heart failure with volume overload (proBNP [phone]).  She was given Lasix and metolazone upon admission that was stopped last night with worsening renal function.   Renal ultrasound on 8/6: increase echogenicity of b/l kidneys, R 12.1cm and L 11.2cm with no hydronephrosis or renal calculi.    Medications: Outpatient medications: Prescriptions prior to admission  Medication Sig Dispense Refill  . aspirin EC 81 MG tablet Take 81 mg by mouth every morning.       . calcitRIOL (ROCALTROL) 0.25 MCG capsule Take 1 capsule (0.25 mcg total) by mouth daily.  30 capsule  0  . calcium carbonate (OS-CAL) 600 MG TABS Take 600 mg by mouth 2 (two) times daily with a meal.      . cyclobenzaprine (FLEXERIL) 10 MG tablet Take 1 tablet (10 mg total) by mouth 3 (three) times daily as needed for muscle spasms. For Spasms  30 tablet  0  . DULoxetine (CYMBALTA) 60 MG capsule Take 1 capsule (60 mg total) by mouth daily.  30 capsule  0  . famotidine  (PEPCID) 20 MG tablet One at bedtime  30 tablet  2  . feeding supplement (RESOURCE BREEZE) LIQD Take 1 Container by mouth 2 (two) times daily between meals.      . ferrous sulfate 325 (65 FE) MG tablet Take 1 tablet (325 mg total) by mouth 2 (two) times daily.  60 tablet  0  . folic acid (FOLVITE) 1 MG tablet Take 1 tablet (1 mg total) by mouth daily.  30 tablet  0  . furosemide (LASIX) 80 MG tablet Take 80 mg by mouth every other day.      . hyoscyamine (LEVSIN, ANASPAZ) 0.125 MG tablet Take 1 tablet (0.125 mg total) by mouth every 4 (four) hours as needed for cramping (bladder spasms).  40 tablet  4  . isosorbide mononitrate (IMDUR) 30 MG 24 hr tablet Take 30 mg by mouth every morning.      Marland Kitchen levothyroxine (SYNTHROID, LEVOTHROID) 25 MCG tablet Take 1 tablet (25 mcg total) by mouth every morning. Take on an empty stomach  30 tablet  0  . magnesium oxide (MAG-OX) 400 MG tablet Take 400 mg by mouth 2 (two) times daily.      Marland Kitchen  nebivolol (BYSTOLIC) 10 MG tablet Take 10 mg by mouth 2 (two) times daily.      Marland Kitchen oxyCODONE-acetaminophen (PERCOCET/ROXICET) 5-325 MG per tablet Take 1-2 tablets by mouth every 4 (four) hours as needed for pain.  50 tablet  0  . pantoprazole (PROTONIX) 40 MG tablet Take 1 tablet (40 mg total) by mouth daily. Take 30-60 min before first meal of the day  30 tablet  2  . pravastatin (PRAVACHOL) 40 MG tablet Take 1 tablet (40 mg total) by mouth daily.  30 tablet  0  . saccharomyces boulardii (FLORASTOR) 250 MG capsule Take 1 capsule (250 mg total) by mouth 2 (two) times daily.  30 capsule  0  . senna-docusate (SENOKOT S) 8.6-50 MG per tablet Take 1 tablet by mouth 2 (two) times daily.  60 tablet  0  . sodium bicarbonate 650 MG tablet Take 1 tablet (650 mg total) by mouth 2 (two) times daily.  60 tablet  0  . sodium hypochlorite (DAKIN'S 1/4 STRENGTH) 0.125 % SOLN Irrigate with 1 application as directed daily as needed. Apply to wound with dressing changes      . terbinafine  (LAMISIL) 1 % cream Apply 1 application topically daily. Applies under stomach      . vitamin C (ASCORBIC ACID) 500 MG tablet Take 500 mg by mouth daily.      Marland Kitchen zinc sulfate 220 MG capsule Take 220 mg by mouth daily.       Current medications: Current Facility-Administered Medications  Medication Dose Route Frequency Provider Last Rate Last Dose  . aspirin EC tablet 81 mg  81 mg Oral q morning - 10a Orvan Falconer, MD   81 mg at 07/18/13 1045  . calcitRIOL (ROCALTROL) capsule 0.25 mcg  0.25 mcg Oral Daily Orvan Falconer, MD   0.25 mcg at 07/18/13 1045  . calcium carbonate (OS-CAL - dosed in mg of elemental calcium) tablet 500 mg of elemental calcium  1 tablet Oral BID WC Thurnell Lose, MD   500 mg of elemental calcium at 07/18/13 0824  . ciprofloxacin (CIPRO) IVPB 400 mg  400 mg Intravenous Q24H Allie Bossier, MD   400 mg at 07/18/13 1045  . cyclobenzaprine (FLEXERIL) tablet 10 mg  10 mg Oral TID PRN Orvan Falconer, MD      . DULoxetine (CYMBALTA) DR capsule 60 mg  60 mg Oral Daily Orvan Falconer, MD   60 mg at 07/18/13 1045  . famotidine (PEPCID) tablet 20 mg  20 mg Oral QHS Orvan Falconer, MD   20 mg at 07/17/13 2333  . feeding supplement (RESOURCE BREEZE) liquid 1 Container  1 Container Oral Q24H Erlene Quan, RD   1 Container at 07/17/13 1700  . ferrous sulfate tablet 325 mg  325 mg Oral BID Orvan Falconer, MD   325 mg at 07/18/13 1045  . folic acid (FOLVITE) tablet 1 mg  1 mg Oral Daily Orvan Falconer, MD   1 mg at 07/18/13 1045  . heparin injection 5,000 Units  5,000 Units Subcutaneous Q8H Orvan Falconer, MD   5,000 Units at 07/18/13 [phone]  . hydrALAZINE (APRESOLINE) tablet 25 mg  25 mg Oral Q8H Orvan Falconer, MD   25 mg at 07/18/13 0824  . insulin aspart (novoLOG) injection 0-15 Units  0-15 Units Subcutaneous TID WC Orvan Falconer, MD      . insulin aspart (novoLOG) injection 0-5 Units  0-5 Units Subcutaneous QHS Orvan Falconer, MD      . isosorbide mononitrate (  IMDUR) 24 hr tablet 30 mg  30 mg Oral q morning - 10a Orvan Falconer, MD   30 mg at  07/18/13 1045  . levothyroxine (SYNTHROID, LEVOTHROID) tablet 25 mcg  25 mcg Oral QAC breakfast Orvan Falconer, MD   25 mcg at 07/18/13 1045  . magnesium oxide (MAG-OX) tablet 400 mg  400 mg Oral BID Orvan Falconer, MD   400 mg at 07/18/13 1045  . morphine 2 MG/ML injection 2 mg  2 mg Intravenous Q4H PRN Orvan Falconer, MD   2 mg at 07/18/13 1103  . nebivolol (BYSTOLIC) tablet 10 mg  10 mg Oral BID Orvan Falconer, MD   10 mg at 07/18/13 1045  . saccharomyces boulardii (FLORASTOR) capsule 250 mg  250 mg Oral BID Orvan Falconer, MD   250 mg at 07/18/13 1045  . senna-docusate (Senokot-S) tablet 1 tablet  1 tablet Oral BID Orvan Falconer, MD   1 tablet at 07/18/13 1045  . simvastatin (ZOCOR) tablet 20 mg  20 mg Oral q1800 Orvan Falconer, MD   20 mg at 07/17/13 1700  . sodium bicarbonate tablet 650 mg  650 mg Oral BID Orvan Falconer, MD   650 mg at 07/18/13 1045  . sodium chloride 0.9 % injection 3 mL  3 mL Intravenous Q12H Orvan Falconer, MD   3 mL at 07/18/13 1046  . zinc sulfate capsule 220 mg  220 mg Oral Daily Orvan Falconer, MD   220 mg at 07/18/13 1045    Allergies: Allergies  Allergen Reactions  . Amoxicillin-Pot Clavulanate Diarrhea  . Rosiglitazone Maleate     REACTION: swelling  . Amoxicillin Rash    Tolerated Zosyn 01/2013.  Stefanie.Gip    Past Medical History: Past Medical History  Diagnosis Date  . Systolic congestive heart failure   . SOB (shortness of breath)   . HTN (hypertension)   . Iron deficiency anemia   . Morbid obesity   . Hyperkalemia   . Hypothyroidism   . CHF (congestive heart failure)   . Hydradenitis   . Pneumonia Nov 10, 2012  . DM type 2 (diabetes mellitus, type 2)     diet controlled  . History of hemodialysis dec 2013    none currently  . Urinary tract infection     taking antibiotics for 3 days prior to surgery  . Chronic kidney disease (CKD), stage III (moderate)     [name] nephrology lov note 05-12-2013 on pt chart  . GERD (gastroesophageal reflux disease)    Past Surgical History: Past Surgical History   Procedure Laterality Date  . Portacath placement    . Cystoscopy w/ ureteral stent placement  05/28/2012    Procedure: CYSTOSCOPY WITH RETROGRADE PYELOGRAM/URETERAL STENT PLACEMENT;  Surgeon: Reece Packer, MD;  Location: [facility];  Service: Urology;  Laterality: Left;  . Cystoscopy w/ retrogrades  11/16/2012    Procedure: CYSTOSCOPY WITH RETROGRADE PYELOGRAM;  Surgeon: Reece Packer, MD;  Location: [facility];  Service: Urology;  Laterality: Bilateral;  CYSTOSCOPY,BILATERAL RETROGRADE PYELOGRAM/ REMOVAL LEFT URETERAL STENT/ FULGERATION BLADDER MUCOSA/ INSERTION RIGHT URETERAL STENT  . Port-a-cath removal    . Cystoscopy with retrograde pyelogram, ureteroscopy and stent placement Right 06/23/2013    Procedure: CYSTOSCOPY WITH RIGHT URETEROSCOPY, RIGHT  RETROGRADE PYELOGRAM, WITH LASER LIPOTRIPSY AND RIGHT URETERAL STENT EXCHANGE ;  Surgeon: Molli Hazard, MD;  Location: [facility];  Service: Urology;  Laterality: Right;  STENT EXCHANGE    . Holmium laser application N/A [id number]    Procedure: HOLMIUM  LASER APPLICATION;  Surgeon: Molli Hazard, MD;  Location: [facility];  Service: Urology;  Laterality: N/A;   Family History: Family History  Problem Relation Age of Onset  . Coronary artery disease Mother   . Hypertension Mother   . Diabetes type II Mother   . Malignant hyperthermia Mother   . Coronary artery disease Father   . Hypertension Father   . Malignant hyperthermia Father   . Cancer Maternal Grandfather     ? Type  . Kidney failure Maternal Grandmother    Social History: History   Social History  . Marital Status: Married    Spouse Name: N/A    Number of Children: N/A  . Years of Education: N/A   Occupational History  . Not on file.   Social History Main Topics  . Smoking status: Former Smoker -- 0.25 packs/day for 1 years    Types: Cigarettes    Quit date: 11/28/1979  . Smokeless tobacco: Never Used  . Alcohol Use: No  . Drug Use: No  . Sexual  Activity: No   Other Topics Concern  . Not on file   Social History Narrative  . No narrative on file   Review of Systems:  Constitutional:  Denies fever, chills, diaphoresis, appetite change and fatigue.   HEENT:  Denies congestion, sore throat  Respiratory:  SOB, DOE, and wheezing.   Cardiovascular:  Leg swelling.   Gastrointestinal:  Obese. Denies nausea, vomiting, abdominal pain, diarrhea, constipation, blood in stool  Genitourinary:  Denies dysuria, hematuria, flank pain  Musculoskeletal:  Denies myalgias, back pain, joint swelling, arthralgias  Skin:  Edematous,  hidradenitis suppurativa   Neurological:  Weakness.     Vital Signs: Blood pressure 119/66, pulse 60, temperature 98 F (36.7 C), temperature source Oral, resp. rate 22, height 5' 6.54" (1.69 m), weight 368 lb 13.3 oz (167.3 kg), SpO2 93.00%.  Weight trends: Filed Weights   07/17/13 0500 07/17/13 0826 07/18/13 0733  Weight: 371 lb 14.7 oz (168.7 kg) 370 lb 2.4 oz (167.9 kg) 368 lb 13.3 oz (167.3 kg)   Physical Exam: Vitals reviewed. General: resting in bed, morbidly obese, pursed lip breathing, on [place] 4L O2, hard on hearing HEENT: PERRL, EOMI Cardiac: distant heart sounds, RRR Pulm: b/l wheezing and diffuse crackles Abd: soft, distended, obese, BS present Skin: hidradenitis suppurativa Ext: warm and well perfused, b/l +4 pitting edema lower extremities, tenderness to palpation b/l legs, +2dp b/l Neuro: alert and oriented X3, cranial nerves II-XII grossly intact, strength and sensation to light touch equal in bilateral upper and lower extremities  Lab results: Basic Metabolic Panel:  Recent Labs Lab 07/17/13 0750 07/17/13 1534 07/18/13 0500 07/18/13 0851  NA 142 141 142 141  K 4.1 4.2 4.2 4.3  CL 110 109 109 107  CO2 20 20 21 21   GLUCOSE 105* 138* 121* 116*  BUN 61* 62* 63* 62*  CREATININE 4.91* 4.95* 5.09* 5.09*  CALCIUM 8.4 8.4 8.4 8.5  MG 2.2  --  2.2  --    Liver Function Tests:  Recent  Labs Lab 07/17/13 0750 07/17/13 1534 07/18/13 0500  AST 8 8 9   ALT <5 <5 <5  ALKPHOS 70 70 71  BILITOT 0.3 0.3 0.3  PROT 8.5* 8.3 8.5*  ALBUMIN 2.5* 2.5* 2.6*   CBC:  Recent Labs Lab 07/16/13 1740 07/17/13 0020 07/17/13 0750 07/18/13 0500  WBC 14.5* 12.1* 12.6* 12.4*  NEUTROABS  --   --  9.8* 10.0*  HGB 11.2* 10.4* 11.0*  10.8*  HCT 37.8 37.0 37.1 37.5  MCV 91.5 93.0 91.2 93.1  PLT 225 217 233 207   Cardiac Enzymes:  Recent Labs Lab 07/16/13 1522 07/17/13 0020 07/17/13 0645 07/17/13 1527  TROPONINI <0.30 <0.30 <0.30 <0.30   CBG:  Recent Labs Lab 07/17/13 1039 07/17/13 1555 07/17/13 2121 07/18/13 0652 07/18/13 1111  GLUCAP 115* 119* 123* 112* 116*   Microbiology: Results for orders placed during the hospital encounter of 07/16/13  CULTURE, BLOOD (ROUTINE X 2)     Status: None   Collection Time    07/17/13  3:27 PM      Result Value Range Status   Specimen Description BLOOD LEFT HAND   Final   Special Requests BOTTLES DRAWN AEROBIC ONLY 10CC   Final   Culture  Setup Time     Final   Value: 07/17/2013 22:26     Performed at [facility]   Culture     Final   Value:        BLOOD CULTURE RECEIVED NO GROWTH TO DATE CULTURE WILL BE HELD FOR 5 DAYS BEFORE ISSUING A FINAL NEGATIVE REPORT     Performed at [facility]   Report Status PENDING   Incomplete  CULTURE, BLOOD (ROUTINE X 2)     Status: None   Collection Time    07/17/13  3:33 PM      Result Value Range Status   Specimen Description BLOOD RIGHT HAND   Final   Special Requests BOTTLES DRAWN AEROBIC ONLY 5CC   Final   Culture  Setup Time     Final   Value: 07/17/2013 22:26     Performed at [facility]   Culture     Final   Value:        BLOOD CULTURE RECEIVED NO GROWTH TO DATE CULTURE WILL BE HELD FOR 5 DAYS BEFORE ISSUING A FINAL NEGATIVE REPORT     Performed at [facility]   Report Status PENDING   Incomplete   Urinalysis:  Recent Labs  07/18/13 0831   COLORURINE YELLOW  LABSPEC 1.012  PHURINE 5.0  GLUCOSEU NEGATIVE  HGBUR SMALL*  BILIRUBINUR NEGATIVE  KETONESUR NEGATIVE  PROTEINUR 30*  UROBILINOGEN 0.2  NITRITE NEGATIVE  LEUKOCYTESUR MODERATE*    Imaging: Dg Chest 2 View  07/16/2013   *RADIOLOGY REPORT*  Clinical Data: Cough with shortness of breath and congestion for 2 weeks.  CHEST - 2 VIEW  Comparison: 06/13/2013.  Findings: Cardiomegaly.  Borderline congestive heart failure/pulmonary vascular congestion.  No effusion or pneumothorax.  No focal infiltrates.  Mild subsegmental atelectasis.  Similar appearance to priors.  IMPRESSION: Cardiomegaly with borderline congestive heart failure.   Original Report Authenticated By: Rolla Flatten, M.D.     Assessment & Plan: [name] is a morbidly obese African American  52 y.o. female with a PMHx of CKD stage 4 (follows with Dr. Florene Glen), CHF EF 99991111 grade 2 diastolic dysfunction, Cdiff positive (01/2013), HTN, DM, asthma, nephrolithiasis s/p b/l stents and recent R ureteral stent placement 06/23/13, iron deficiency anemia, and hidradenitis suppurativa who was admitted to [facility] on 07/16/2013 for evaluation of worsening DOE and orthopnea and found to have acute on chronic renal failure.   Acute on chronic renal failure--CKD stage 4, follows with Dr. Florene Glen.  Likely multifactorial given significant volume overload and hx of obstruction.  Cr 3-4 baseline.  4.74 on admission and up to 5.09 today. Complicated hx with prior HD during December 2013 hospital course and  recent R ureteral stent removal and placement 06/23/13.  Volume overloaded on physical exam, she is not sure of her dry weight.  Worsening DOE and orthopnea.  Cardiorenal syndrome, but making good urine.  Will need to continue to diurese and may need HD if no improvement. Renal ultrasound on 8/6: increase echogenicity of b/l kidneys, R 12.1cm and L 11.2cm with no hydronephrosis or renal calculi.    Recent Labs Lab 07/17/13 0020 07/17/13 0750  07/17/13 1534 07/18/13 0500 07/18/13 0851  CREATININE 4.83* 4.91* 4.95* 5.09* 5.09*   -Lasix 80mg  IV BID -trend renal function and replace electrolytes as needed -strict I/O -daily weights -f/u urology  Acute on chronic CHF-- Echo with grade 2 DD, EF 50-55% and PA peak pressure 87mmHg. Agree with need for diuresis. Home medications include: ASA, lasix 80mg  every other day, IMDUR 30mg , and Bystolic.  -continue lasix -management per primary team  Anemia of chronic disease in setting of CKD stage 4.  Mother claims she gets weekly aranesp injections in the hospital and occasionally IV iron as well.  Hb 11.2 on admission with last Iron studies 06/30/13: Iron 26, Ferritin 356, TIBC 171, 15% sat ratio. -trend Hb  Leukocytosis--likely multifactorial with numerous bacteria on U/A and multiple wound ulcers with pus drainage.  Now on Ciprofloxacin.  Wbc trending down.  Afebrile.  -management per primary team  Case discussed and patient seen with Dr. Marval Regal  Signed: Jerene Pitch, MD PGY-2, Internal Medicine Resident Pager: [phone]  07/18/2013,1:46 PM

## 2013-07-18 NOTE — Plan of Care (Signed)
Problem: Phase I Progression Outcomes Goal: EF % per last Echo/documented,Core Reminder form on chart Outcome: Completed/Met Date Met:  07/18/13 EF 50-55% per ECHO performed on 07/17/13.

## 2013-07-19 ENCOUNTER — Inpatient Hospital Stay (HOSPITAL_COMMUNITY): Payer: BC Managed Care – PPO

## 2013-07-19 LAB — CBC WITH DIFFERENTIAL/PLATELET
Basophils Absolute: 0 10*3/uL (ref 0.0–0.1)
Eosinophils Absolute: 0.8 10*3/uL — ABNORMAL HIGH (ref 0.0–0.7)
Eosinophils Relative: 7 % — ABNORMAL HIGH (ref 0–5)
Lymphocytes Relative: 9 % — ABNORMAL LOW (ref 12–46)
MCH: 27.8 pg (ref 26.0–34.0)
MCV: 94 fL (ref 78.0–100.0)
Platelets: 194 10*3/uL (ref 150–400)
RDW: 18.8 % — ABNORMAL HIGH (ref 11.5–15.5)
WBC: 11.7 10*3/uL — ABNORMAL HIGH (ref 4.0–10.5)

## 2013-07-19 LAB — COMPREHENSIVE METABOLIC PANEL
AST: 7 U/L (ref 0–37)
CO2: 21 mEq/L (ref 19–32)
Chloride: 108 mEq/L (ref 96–112)
Creatinine, Ser: 5.52 mg/dL — ABNORMAL HIGH (ref 0.50–1.10)
GFR calc non Af Amer: 8 mL/min — ABNORMAL LOW (ref >90)
Glucose, Bld: 120 mg/dL — ABNORMAL HIGH (ref 70–99)
Total Bilirubin: 0.3 mg/dL (ref 0.3–1.2)

## 2013-07-19 LAB — PHOSPHORUS: Phosphorus: 4.8 mg/dL — ABNORMAL HIGH (ref 2.3–4.6)

## 2013-07-19 LAB — GLUCOSE, CAPILLARY: Glucose-Capillary: 135 mg/dL — ABNORMAL HIGH (ref 70–99)

## 2013-07-19 LAB — ZINC: Zinc: 66 ug/dL (ref 60–130)

## 2013-07-19 MED ORDER — VANCOMYCIN HCL 10 G IV SOLR
1500.0000 mg | INTRAVENOUS | Status: DC
  Administered 2013-07-19 – 2013-07-21 (×2): 1500 mg via INTRAVENOUS
  Filled 2013-07-19 (×2): qty 1500

## 2013-07-19 MED ORDER — FUROSEMIDE 10 MG/ML IJ SOLN
80.0000 mg | Freq: Three times a day (TID) | INTRAMUSCULAR | Status: DC
  Administered 2013-07-19 – 2013-07-21 (×6): 80 mg via INTRAVENOUS
  Filled 2013-07-19 (×6): qty 8

## 2013-07-19 NOTE — Progress Notes (Signed)
ANTIBIOTIC CONSULT NOTE - INITIAL  Pharmacy Consult for vancomycin Indication: enterococcus in urine + wounds  Allergies  Allergen Reactions  . Amoxicillin-Pot Clavulanate Diarrhea  . Rosiglitazone Maleate     REACTION: swelling  . Amoxicillin Rash    Tolerated Zosyn 01/2013.  [name]    Patient Measurements: Height: 5' 6.53" (169 cm) Weight: 393 lb 15.4 oz (178.7 kg) (bedscale/pt on new bed) IBW/kg (Calculated) : 60.53   Vital Signs: Temp: 97.8 F (36.6 C) (08/23 0705) Temp src: Oral (08/23 0705) BP: 126/65 mmHg (08/23 0705) Pulse Rate: 55 (08/23 0705) Intake/Output from previous day: 08/22 0701 - 08/23 0700 In: 1180 [P.O.:1180] Out: 875 [Urine:875] Intake/Output from this shift: Total I/O In: 280 [P.O.:280] Out: 550 [Urine:550]  Labs:  Recent Labs  07/17/13 0750  07/18/13 0500 07/18/13 0851 07/19/13 0500  WBC 12.6*  --  12.4*  --  11.7*  HGB 11.0*  --  10.8*  --  11.1*  PLT 233  --  207  --  194  CREATININE 4.91*  < > 5.09* 5.09* 5.52*  < > = values in this interval not displayed. Estimated Creatinine Clearance: 20.5 ml/min (by C-G formula based on Cr of 5.52). No results found for this basename: VANCOTROUGH, Corlis Leak, VANCORANDOM, [place], [place], [place], [place], [place], TOBRARND, AMIKACINPEAK, AMIKACINTROU, AMIKACIN,  in the last 72 hours   Microbiology: Recent Results (from the past 720 hour(s))  URINE CULTURE     Status: None   Collection Time    07/17/13 12:29 PM      Result Value Range Status   Specimen Description URINE, CATHETERIZED   Final   Special Requests NONE   Final   Culture  Setup Time     Final   Value: 07/17/2013 16:43     Performed at [place]     Final   Value: >=100,000 COLONIES/ML     Performed at [facility]   Culture     Final   Value: ENTEROCOCCUS SPECIES     Performed at [facility]   Report Status PENDING   Incomplete  CULTURE, BLOOD (ROUTINE X 2)     Status:  None   Collection Time    07/17/13  3:27 PM      Result Value Range Status   Specimen Description BLOOD LEFT HAND   Final   Special Requests BOTTLES DRAWN AEROBIC ONLY 10CC   Final   Culture  Setup Time     Final   Value: 07/17/2013 22:26     Performed at [facility]   Culture     Final   Value:        BLOOD CULTURE RECEIVED NO GROWTH TO DATE CULTURE WILL BE HELD FOR 5 DAYS BEFORE ISSUING A FINAL NEGATIVE REPORT     Performed at [facility]   Report Status PENDING   Incomplete  CULTURE, BLOOD (ROUTINE X 2)     Status: None   Collection Time    07/17/13  3:33 PM      Result Value Range Status   Specimen Description BLOOD RIGHT HAND   Final   Special Requests BOTTLES DRAWN AEROBIC ONLY 5CC   Final   Culture  Setup Time     Final   Value: 07/17/2013 22:26     Performed at [facility]   Culture     Final   Value:        BLOOD CULTURE RECEIVED NO GROWTH  TO DATE CULTURE WILL BE HELD FOR 5 DAYS BEFORE ISSUING A FINAL NEGATIVE REPORT     Performed at [facility]   Report Status PENDING   Incomplete    Assessment: 54 YOF who was on ciprofloxacin for a UTI which is now growing >100K enterococcus. Patient also has multiple skin abscesses and MD is switching antibiotics to vancomycin to be dosed per pharmacy. Patient is in acute renal failure with SCr 5.52 and normalized CrCl (weight not taken into account) ~54mL/min.   Goal of Therapy:  Vancomycin trough level 15-20 mcg/ml  Plan:  1. Start vancomycin 1500mg  Q48hr 2. Follow renal function, c/s, LOT, opportunity to de-escalate 3. Trough at [place]. Malya Cirillo, PharmD Clinical Pharmacist Pager: [phone] 07/19/2013 11:22 AM

## 2013-07-19 NOTE — Progress Notes (Signed)
To ultrasound for repeat renal as ordered.  Vomited while on right side.

## 2013-07-19 NOTE — Progress Notes (Signed)
Reviewed outpt notes for CKD (Stage 3/4 prior to admission)   Pt had ureteral stone with right ureteral stent in 05/2013.  Cr. 5.01 with BUN 69 on 06/30/13. These #'s previously have been 2.85 and 40.  Creatinine from April to June 2014 ranged from 2.39-2.87. Creatinine on 07/03/13 was 4.81 with GFR 11.      06/23/13 she had a cystoscospy, right ureteroscopy, laser lithotripsy, right ureteral stent removal and replacement of right ureter stent.  According to dictation from Dr. Jasmine December her right ureteral stent was encrusted.  No stricture but large amt of urinary sediment and debris in the right kidney along with nephrolithiasis.  She has a lithotripsy w/o sediment of stone being able to be obtained though the ureter was wide and able to be navigated easily.  There was concern for the urine not being able to drain well from the kidney due to the encrusted stent that was in place and the stent was replaced.  At that time they had a question if her stent was obstructed following lithotripsy.  She had a renal US on 8/6 w/o hydronephrosis. She had a left ureteral stent previously removed (Dr. Nicki Reaper MacDirmid).   Per Dr. Ulice Bold notes she does not have good veins and with h/o hidradnitis suppurativa both may be problematic as far as access in the future   Meds as of 05/12/2013: Feraheme 510 mg iv (given in the past), h/o Aranesp 200 mg every week use, Calcitriol 0.25 mg qd, Calcium Cabonate 1250 mg bid, Lasix 80 mg every other day, Mag ox 400 mg bid, Na bicarbonate 650 mg bid  Baseline Hbg 8-9.   Aundra Dubin MD

## 2013-07-19 NOTE — Progress Notes (Signed)
[facility] PROGRESS NOTE  [name] [id number] DOB: 11-01-1961 DOA: 07/16/2013 PCP: [name], MD  HPI/Subjective: Feels much better than yesterday, still have some shortness of breath  Assessment/Plan:  CHF, acute on chronic combined -Increased fluid overload, nephrology consulted on managing diuresis. -Continue Imdur, Bystolic and hydralazine. -LVEF is 5055%, improved from 35% in 2011, has grade 2 diastolic dysfunction.  Acute on chronic renal failure  -With CHF, consistent with cardiorenal syndrome. -Nephrology is managing, continue diuresis because of fluid overload. -Urine output is decreasing, yesterday was 875 mL/24 hours. -Creatinine was 2.8 and 06/13/2013. -Presented with creatinine of 4.6 this time, creatinine increased to 5.5 today. -On Lasix 80 mg twice a day.  UTI -Urine culture is growing enterococcus. Patient apparently on ciprofloxacin. -Blood culture is sterile to date. -Change to vancomycin now and adjust antibiotics according to the sensitivity results. -Changed to vancomycin, especially that patient has multiple skin abscesses, pharmacy to dose  Diabetes mellitus type 2 -Controlled diabetes mellitus type 2 with hemoglobin A1c of 5.7, this indicates mean plasma glucose of 117. -Continue insulin sliding scale.  Hidradenitis suppurativa -Multiple areas of abscesses, with purulent discharge. -Patient is on ciprofloxacin, Change to IV vancomycin.  Rectovaginal fistula -Referred to Dr. Harolyn Rutherford [facility] as outpatient.  OSA -Placed on CPAP the hospital through nasal mask, tolerated that very well. -Reported that she is not on CPAP at home.  Code Status: Full code Family Communication: Plan discussed with patient Disposition Plan: Remains the patient.   Consultants:  [facility]  Procedures:  None  Antibiotics:  None   Objective: Filed Vitals:   07/19/13 0705  BP: 126/65  Pulse: 55  Temp: 97.8 F (36.6 C)   Resp: 20    Intake/Output Summary (Last 24 hours) at 07/19/13 1038 Last data filed at 07/19/13 1001  Gross per 24 hour  Intake   1100 ml  Output   1425 ml  Net   -325 ml   Filed Weights   07/17/13 0826 07/18/13 0733 07/19/13 0705  Weight: 167.9 kg (370 lb 2.4 oz) 167.3 kg (368 lb 13.3 oz) 178.7 kg (393 lb 15.4 oz)    Exam: General: Alert and awake, oriented x3, not in any acute distress. Morbid obesity HEENT: anicteric sclera, pupils reactive to light and accommodation, EOMI CVS: S1-S2 clear, no murmur rubs or gallops Chest: clear to auscultation bilaterally, no wheezing, rales or rhonchi Abdomen: soft nontender, nondistended, normal bowel sounds, no organomegaly Extremities: no cyanosis, clubbing or edema noted bilaterally Neuro: Cranial nerves II-XII intact, no focal neurological deficits  Data Reviewed: Basic Metabolic Panel:  Recent Labs Lab 07/17/13 0750 07/17/13 1534 07/18/13 0500 07/18/13 0851 07/19/13 0500  NA 142 141 142 141 141  K 4.1 4.2 4.2 4.3 4.1  CL 110 109 109 107 108  CO2 20 20 21 21 21   GLUCOSE 105* 138* 121* 116* 120*  BUN 61* 62* 63* 62* 65*  CREATININE 4.91* 4.95* 5.09* 5.09* 5.52*  CALCIUM 8.4 8.4 8.4 8.5 8.5  MG 2.2  --  2.2  --  2.2  PHOS  --   --   --   --  4.8*   Liver Function Tests:  Recent Labs Lab 07/17/13 0750 07/17/13 1534 07/18/13 0500 07/19/13 0500  AST 8 8 9 7   ALT <5 <5 <5 <5  ALKPHOS 70 70 71 76  BILITOT 0.3 0.3 0.3 0.3  PROT 8.5* 8.3 8.5* 8.3  ALBUMIN 2.5* 2.5* 2.6* 2.3*   No results found for this basename: LIPASE,  AMYLASE,  in the last 168 hours No results found for this basename: AMMONIA,  in the last 168 hours CBC:  Recent Labs Lab 07/16/13 1740 07/17/13 0020 07/17/13 0750 07/18/13 0500 07/19/13 0500  WBC 14.5* 12.1* 12.6* 12.4* 11.7*  NEUTROABS  --   --  9.8* 10.0* 9.3*  HGB 11.2* 10.4* 11.0* 10.8* 11.1*  HCT 37.8 37.0 37.1 37.5 37.5  MCV 91.5 93.0 91.2 93.1 94.0  PLT 225 217 233 207 194    Cardiac Enzymes:  Recent Labs Lab 07/16/13 1522 07/17/13 0020 07/17/13 0645 07/17/13 1527  TROPONINI <0.30 <0.30 <0.30 <0.30   BNP (last 3 results)  Recent Labs  08/16/12 2049 11/10/12 1724 07/16/13 1522  PROBNP 26806.0* 33908.0* 29655.0*   CBG:  Recent Labs Lab 07/18/13 0652 07/18/13 1111 07/18/13 1604 07/18/13 2155 07/19/13 0717  GLUCAP 112* 116* 143* 119* 97    Recent Results (from the past 240 hour(s))  URINE CULTURE     Status: None   Collection Time    07/17/13 12:29 PM      Result Value Range Status   Specimen Description URINE, CATHETERIZED   Final   Special Requests NONE   Final   Culture  Setup Time     Final   Value: 07/17/2013 16:43     Performed at [place]     Final   Value: >=100,000 COLONIES/ML     Performed at [facility]   Culture     Final   Value: ENTEROCOCCUS SPECIES     Performed at [facility]   Report Status PENDING   Incomplete  CULTURE, BLOOD (ROUTINE X 2)     Status: None   Collection Time    07/17/13  3:27 PM      Result Value Range Status   Specimen Description BLOOD LEFT HAND   Final   Special Requests BOTTLES DRAWN AEROBIC ONLY 10CC   Final   Culture  Setup Time     Final   Value: 07/17/2013 22:26     Performed at [facility]   Culture     Final   Value:        BLOOD CULTURE RECEIVED NO GROWTH TO DATE CULTURE WILL BE HELD FOR 5 DAYS BEFORE ISSUING A FINAL NEGATIVE REPORT     Performed at [facility]   Report Status PENDING   Incomplete  CULTURE, BLOOD (ROUTINE X 2)     Status: None   Collection Time    07/17/13  3:33 PM      Result Value Range Status   Specimen Description BLOOD RIGHT HAND   Final   Special Requests BOTTLES DRAWN AEROBIC ONLY 5CC   Final   Culture  Setup Time     Final   Value: 07/17/2013 22:26     Performed at [facility]   Culture     Final   Value:        BLOOD CULTURE RECEIVED NO GROWTH TO DATE CULTURE WILL BE HELD FOR 5  DAYS BEFORE ISSUING A FINAL NEGATIVE REPORT     Performed at [facility]   Report Status PENDING   Incomplete     Studies: No results found.  Scheduled Meds: . aspirin EC  81 mg Oral q morning - 10a  . calcitRIOL  0.25 mcg Oral Daily  . calcium carbonate  1 tablet Oral BID WC  . ciprofloxacin  400 mg Intravenous Q24H  .  DULoxetine  60 mg Oral Daily  . famotidine  20 mg Oral QHS  . feeding supplement  1 Container Oral Q24H  . ferrous sulfate  325 mg Oral BID  . folic acid  1 mg Oral Daily  . furosemide  80 mg Intravenous BID  . heparin  5,000 Units Subcutaneous Q8H  . hydrALAZINE  25 mg Oral Q8H  . insulin aspart  0-15 Units Subcutaneous TID WC  . insulin aspart  0-5 Units Subcutaneous QHS  . isosorbide mononitrate  30 mg Oral q morning - 10a  . levothyroxine  25 mcg Oral QAC breakfast  . magnesium oxide  400 mg Oral BID  . nebivolol  10 mg Oral BID  . saccharomyces boulardii  250 mg Oral BID  . senna-docusate  1 tablet Oral BID  . simvastatin  20 mg Oral q1800  . sodium bicarbonate  650 mg Oral BID  . sodium chloride  3 mL Intravenous Q12H  . zinc sulfate  220 mg Oral Daily   Continuous Infusions:   Principal Problem:   Diastolic CHF Active Problems:   Morbid obesity   GERD   DM type 2 (diabetes mellitus, type 2)   Hypertension   Renal failure (ARF), acute on chronic   SOB (shortness of breath)   OSA (obstructive sleep apnea)   UTI (urinary tract infection)    Time spent: 35 minutes    [facility] Pager [phone]. If 7PM-7AM, please contact night-coverage at [email or web address], password [facility] 07/19/2013, 10:38 AM  LOS: 3 days

## 2013-07-19 NOTE — Progress Notes (Signed)
I have seen and examined this patient and agree with plan as outlined by Dr. Aundra Dubin.  Pt with nonadherence to sodium and fluid restriction as well as monitoring her weights daily and has had a decompensation over the last 3 weeks.  Now with marked volume overload.  Only received 2 doses of the higher lasix dose.  Will increase to q8h as she still has volume excess on exam.  We expect her Scr to worsen before it starts to improve.  Hold off on metolazone for now. Asli Tokarski A,MD 07/19/2013 10:58 AM

## 2013-07-19 NOTE — Progress Notes (Signed)
Subjective: Asleep though arousable.  Breathing is better this am.  Denies chest pain, abdominal pain.   Objective: Vital signs in last 24 hours: Filed Vitals:   07/18/13 1337 07/18/13 2158 07/18/13 2315 07/19/13 0705  BP: 127/47 116/58  126/65  Pulse: 52 58 64 55  Temp: 98 F (36.7 C) 97.6 F (36.4 C)  97.8 F (36.6 C)  TempSrc: Oral Oral  Oral  Resp: 21 22  20   Height:      Weight:    393 lb 15.4 oz (178.7 kg)  SpO2: 100% 90% 99% 94%   Weight change: -1 lb 5.2 oz (-0.6 kg)  Intake/Output Summary (Last 24 hours) at 07/19/13 0751 Last data filed at 07/19/13 0709  Gross per 24 hour  Intake    940 ml  Output   1425 ml  Net   -485 ml   Vitals reviewed. General: resting in bed, NAD, alseep but arousable, morbidly obese HEENT: [place]/at, cpap intact, no scleral icterus Cardiac: slightly bradycardic, no rubs, murmurs or gallops Pulm: clear to auscultation bilaterally, no wheezes, rales, or rhonchi Abd: firm, nontender, distended, BS present Ext: warm and well perfused, 1+ edema b/l Neuro: sleep but arousable answers questions appropriately, grossly neurologically intact.  Sacral: multiple sacral wounds with yellow drainage and odor.   Lab Results: Basic Metabolic Panel:  Recent Labs Lab 07/18/13 0500 07/18/13 0851 07/19/13 0500  NA 142 141 141  K 4.2 4.3 4.1  CL 109 107 108  CO2 21 21 21   GLUCOSE 121* 116* 120*  BUN 63* 62* 65*  CREATININE 5.09* 5.09* 5.52*  CALCIUM 8.4 8.5 8.5  MG 2.2  --  2.2  PHOS  --   --  4.8*   Liver Function Tests:  Recent Labs Lab 07/18/13 0500 07/19/13 0500  AST 9 7  ALT <5 <5  ALKPHOS 71 76  BILITOT 0.3 0.3  PROT 8.5* 8.3  ALBUMIN 2.6* 2.3*   CBC:  Recent Labs Lab 07/18/13 0500 07/19/13 0500  WBC 12.4* 11.7*  NEUTROABS 10.0* 9.3*  HGB 10.8* 11.1*  HCT 37.5 37.5  MCV 93.1 94.0  PLT 207 194   Cardiac Enzymes:  Recent Labs Lab 07/17/13 0020 07/17/13 0645 07/17/13 1527  TROPONINI <0.30 <0.30 <0.30    BNP:  Recent Labs Lab 07/16/13 1522  PROBNP 29655.0*   CBG:  Recent Labs Lab 07/17/13 2121 07/18/13 0652 07/18/13 1111 07/18/13 1604 07/18/13 2155 07/19/13 0717  GLUCAP 123* 112* 116* 143* 119* 97   Hemoglobin A1C:  Recent Labs Lab 07/17/13 0750  HGBA1C 5.7*   Thyroid Function Tests:  Recent Labs Lab 07/17/13 0750  TSH 4.966*  FREET4 1.07   Urinalysis:  Recent Labs Lab 07/18/13 0831  COLORURINE YELLOW  LABSPEC 1.012  PHURINE 5.0  GLUCOSEU NEGATIVE  HGBUR SMALL*  BILIRUBINUR NEGATIVE  KETONESUR NEGATIVE  PROTEINUR 30*  UROBILINOGEN 0.2  NITRITE NEGATIVE  LEUKOCYTESUR MODERATE*  Results for [name], [name] (MRN [id number]) as of 07/19/2013 07:50  Ref. Range 07/18/2013 08:31  Hgb urine dipstick Latest Range: NEGATIVE  SMALL (A)  Urine-Other No range found MUCOUS PRESENT  WBC, UA Latest Range: <3 WBC/hpf TOO NUMEROUS TO COUNT  RBC / HPF Latest Range: <3 RBC/hpf 3-6  Bacteria, UA Latest Range: RARE  FEW (A)   Misc. Labs: Zinc, Selenium, urine culture   Micro Results: Recent Results (from the past 240 hour(s))  URINE CULTURE     Status: None   Collection Time    07/17/13 12:29 PM  Result Value Range Status   Specimen Description URINE, CATHETERIZED   Final   Special Requests NONE   Final   Culture  Setup Time     Final   Value: 07/17/2013 16:43     Performed at [place] PENDING   Incomplete   Culture     Final   Value: Culture reincubated for better growth     Performed at [facility]   Report Status PENDING   Incomplete  CULTURE, BLOOD (ROUTINE X 2)     Status: None   Collection Time    07/17/13  3:27 PM      Result Value Range Status   Specimen Description BLOOD LEFT HAND   Final   Special Requests BOTTLES DRAWN AEROBIC ONLY 10CC   Final   Culture  Setup Time     Final   Value: 07/17/2013 22:26     Performed at [facility]   Culture     Final   Value:        BLOOD CULTURE RECEIVED NO  GROWTH TO DATE CULTURE WILL BE HELD FOR 5 DAYS BEFORE ISSUING A FINAL NEGATIVE REPORT     Performed at [facility]   Report Status PENDING   Incomplete  CULTURE, BLOOD (ROUTINE X 2)     Status: None   Collection Time    07/17/13  3:33 PM      Result Value Range Status   Specimen Description BLOOD RIGHT HAND   Final   Special Requests BOTTLES DRAWN AEROBIC ONLY 5CC   Final   Culture  Setup Time     Final   Value: 07/17/2013 22:26     Performed at [facility]   Culture     Final   Value:        BLOOD CULTURE RECEIVED NO GROWTH TO DATE CULTURE WILL BE HELD FOR 5 DAYS BEFORE ISSUING A FINAL NEGATIVE REPORT     Performed at [facility]   Report Status PENDING   Incomplete   Studies/Results: No results found. Medications:  Scheduled Meds: . aspirin EC  81 mg Oral q morning - 10a  . calcitRIOL  0.25 mcg Oral Daily  . calcium carbonate  1 tablet Oral BID WC  . ciprofloxacin  400 mg Intravenous Q24H  . DULoxetine  60 mg Oral Daily  . famotidine  20 mg Oral QHS  . feeding supplement  1 Container Oral Q24H  . ferrous sulfate  325 mg Oral BID  . folic acid  1 mg Oral Daily  . furosemide  80 mg Intravenous BID  . heparin  5,000 Units Subcutaneous Q8H  . hydrALAZINE  25 mg Oral Q8H  . insulin aspart  0-15 Units Subcutaneous TID WC  . insulin aspart  0-5 Units Subcutaneous QHS  . isosorbide mononitrate  30 mg Oral q morning - 10a  . levothyroxine  25 mcg Oral QAC breakfast  . magnesium oxide  400 mg Oral BID  . nebivolol  10 mg Oral BID  . saccharomyces boulardii  250 mg Oral BID  . senna-docusate  1 tablet Oral BID  . simvastatin  20 mg Oral q1800  . sodium bicarbonate  650 mg Oral BID  . sodium chloride  3 mL Intravenous Q12H  . zinc sulfate  220 mg Oral Daily   Continuous Infusions:  PRN Meds:.cyclobenzaprine, morphine injection Assessment/Plan: [name] is a morbidly obese African American 52 y.o. female  with a PMHx of CKD stage 4 (follows with Dr.  Florene Glen), CHF EF 99991111 grade 2 diastolic dysfunction, Cdiff positive (01/2013), HTN, DM, asthma, nephrolithiasis s/p b/l stents and recent R ureteral stent removal and placement 06/23/13, iron deficiency anemia, and hidradenitis suppurativa who was admitted to [facility] on 07/16/2013 for evaluation of worsening DOE and orthopnea and found to have acute on chronic renal failure.   Acute on chronic renal failure -CKD stage 4, follows with Dr. Florene Glen.  -Likely multifactorial given significant volume overload and hx of GU obstruction. Cr 3-4 baseline. 4.74 on admission and up to 5.52 today. Concern for cardiorenal syndrome.  -Renal ultrasound on 8/6: increase echogenicity of b/l kidneys, R 12.1cm and L 11.2cm with no hydronephrosis or renal calculi.  -Continue to diurese with Lasix -trend renal function and replace electrolytes as needed  -strict I/O  -daily weights (asked to reweight pt today wts dont seem accurate) -rec. 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  -Lasix 80 mg bid i.v  -Cont. ASA, IMDUR 30mg , statin, hydralazine, Bystolic.  -continue lasix, strict i/o; daily weights  -management per primary team   Anemia of chronic disease  -trend Hb currently stable  -On Fe tid   Leukocytosis -likely multifactorial ?UTI (urine culture still pending), multiple wound ulcers with pus drainage -On Ciprofloxacin. Wbc trending down. Afebrile.  -management per primary team  Thyroid disease  -Cont synthyroid   H/o DM  -SSI  Sacral decubitus wounds -wound RN consulted  -primary team addressing  F/E/N -Trend renal function panel  -Renal diet   DVT  -heparin sq    LOS: 3 days   Cresenciano Genre [id number] 07/19/2013, 7:51 AM Discussed with attending

## 2013-07-19 NOTE — Evaluation (Signed)
Physical Therapy Evaluation Patient Details Name: [name] MRN: [id number] DOB: May 13, 1961 Today's Date: 07/19/2013 Time: [id number] PT Time Calculation (min): 36 min  PT Assessment / Plan / Recommendation History of Present Illness  [name] is an 52 y.o. female with multiple medical problems including DM, morbid obesity, asthma, CHF (Epic said systolic, but EF 60 percent, so likely diastolic), CKD 3, Hypothyroidism presents to the ERWith 3 days hx of increase shortness of breath, chest tightness, and some orthopnea.  She was having some wheezing, and had seen her pulmonologist, who tx her for possible GERD.  She denied fever, chills, or coughs.  But  Admitted to have some orthopnea.  Her legs have been swelling more, but she has no calf tenderness.  Work up in the ER included a CXR which showed cardiomegaly and borderline CHF.  Her WBC was 14K, and her Cr is 4.4.  Clinical Impression  Pt admitted with SOB. Pt currently with functional limitations due to the deficits listed below (see PT Problem List). Pt currently very limited and unable to get OOB with therapist assist safely.  Recommend lift at this time.  Prior to admission pt was independent.  Pt currently needs +2-3 assist to sit EOB and roll.  Therefore pt will need further therapy prior to d/c home.  Pt needs max motivation to work with therapy.  Pt will benefit from skilled PT to increase their independence and safety with mobility to allow discharge to the venue listed below.      PT Assessment  Patient needs continued PT services    Follow Up Recommendations  SNF    Barriers to Discharge Decreased caregiver support      Equipment Recommendations  Other (comment) (continue to assess)    Frequency Min 3X/week    Precautions / Restrictions Precautions Precautions: Fall Restrictions Weight Bearing Restrictions: No   Pertinent Vitals/Pain No c/o pain      Mobility  Bed Mobility Bed Mobility: Rolling Right;Rolling  Left;Right Sidelying to Sit Rolling Right: 1: +2 Total assist;With rail Rolling Right: Patient Percentage: 40% Rolling Left: 1: +2 Total assist;With rail Rolling Left: Patient Percentage: 40% Right Sidelying to Sit: 1: +2 Total assist;With rails;HOB elevated Right Sidelying to Sit: Patient Percentage: 40% Details for Bed Mobility Assistance: +2 (A) with all mobility and needed assist to roll and assisting panus to complete roll.  Multiple rolls to place lift pad under pt to assist RN staff in weighing pt. Unable to sit completely upright on EOB due to pt unable to assist due to right shoulder pain.  Transfers Transfers: Not assessed Ambulation/Gait Ambulation/Gait Assistance: Not tested (comment)    Exercises     PT Diagnosis: Difficulty walking;Generalized weakness;Acute pain  PT Problem List: Decreased strength;Decreased activity tolerance;Decreased balance;Decreased mobility;Decreased knowledge of use of DME;Cardiopulmonary status limiting activity;Obesity;Decreased skin integrity PT Treatment Interventions: DME instruction;Functional mobility training;Therapeutic activities;Balance training;Gait training;Therapeutic exercise;Patient/family education     PT Goals(Current goals can be found in the care plan section) Acute Rehab PT Goals Patient Stated Goal: Did not set PT Goal Formulation: With patient Time For Goal Achievement: 08/02/13 Potential to Achieve Goals: Fair  Visit Information  Last PT Received On: 07/19/13 Assistance Needed: +3 or more History of Present Illness: [name] is an 52 y.o. female with multiple medical problems including DM, morbid obesity, asthma, CHF (Epic said systolic, but EF 60 percent, so likely diastolic), CKD 3, Hypothyroidism presents to the ERWith 3 days hx of increase shortness of breath, chest tightness, and some  orthopnea.  She was having some wheezing, and had seen her pulmonologist, who tx her for possible GERD.  She denied fever, chills,  or coughs.  But  Admitted to have some orthopnea.  Her legs have been swelling more, but she has no calf tenderness.  Work up in the ER included a CXR which showed cardiomegaly and borderline CHF.  Her WBC was 14K, and her Cr is 4.4.       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 Dominant Hand: Right    Cognition  Cognition Arousal/Alertness: Awake/alert Behavior During Therapy: Agitated (Pt became agitated with PT and RN tech due to rolling) Overall Cognitive Status: Within Functional Limits for tasks assessed    Extremity/Trunk Assessment Lower Extremity Assessment Lower Extremity Assessment: Generalized weakness   Balance    End of Session PT - End of Session Activity Tolerance: Patient limited by fatigue;Patient limited by pain Patient left: in bed;with call bell/phone within reach;with nursing/sitter in room Nurse Communication: Mobility status;Need for lift equipment  GP     Christopherjame Carnell 07/19/2013, 1:13 PM   [place], [place] DPT [phone]

## 2013-07-20 LAB — COMPREHENSIVE METABOLIC PANEL
BUN: 68 mg/dL — ABNORMAL HIGH (ref 6–23)
CO2: 22 mEq/L (ref 19–32)
Chloride: 106 mEq/L (ref 96–112)
Creatinine, Ser: 5.6 mg/dL — ABNORMAL HIGH (ref 0.50–1.10)
GFR calc non Af Amer: 8 mL/min — ABNORMAL LOW (ref >90)
Total Bilirubin: 0.3 mg/dL (ref 0.3–1.2)

## 2013-07-20 LAB — GLUCOSE, CAPILLARY
Glucose-Capillary: 107 mg/dL — ABNORMAL HIGH (ref 70–99)
Glucose-Capillary: 126 mg/dL — ABNORMAL HIGH (ref 70–99)

## 2013-07-20 LAB — CBC WITH DIFFERENTIAL/PLATELET
Eosinophils Absolute: 0.8 10*3/uL — ABNORMAL HIGH (ref 0.0–0.7)
Lymphocytes Relative: 5 % — ABNORMAL LOW (ref 12–46)
Lymphs Abs: 0.7 10*3/uL (ref 0.7–4.0)
Neutrophils Relative %: 84 % — ABNORMAL HIGH (ref 43–77)
Platelets: 218 10*3/uL (ref 150–400)
RBC: 4.38 MIL/uL (ref 3.87–5.11)
WBC: 13.7 10*3/uL — ABNORMAL HIGH (ref 4.0–10.5)

## 2013-07-20 MED ORDER — OXYCODONE-ACETAMINOPHEN 5-325 MG PO TABS
1.0000 | ORAL_TABLET | Freq: Four times a day (QID) | ORAL | Status: DC | PRN
  Administered 2013-07-20 – 2013-07-30 (×12): 2 via ORAL
  Filled 2013-07-20: qty 2
  Filled 2013-07-20: qty 1
  Filled 2013-07-20 (×5): qty 2
  Filled 2013-07-20: qty 1
  Filled 2013-07-20 (×2): qty 2
  Filled 2013-07-20 (×2): qty 1
  Filled 2013-07-20 (×5): qty 2

## 2013-07-20 MED ORDER — CHLORHEXIDINE GLUCONATE 4 % EX LIQD
Freq: Every day | CUTANEOUS | Status: DC | PRN
  Administered 2013-07-21 – 2013-07-29 (×3): via TOPICAL
  Filled 2013-07-20 (×5): qty 15

## 2013-07-20 NOTE — Progress Notes (Addendum)
Weight change given to nephrology with brief report.  Client spoke with physician.  1408:  Wounds cleaned with Hibiclens, large amount of purulent brownish drainage noted.  Skin is hard to touch in vaginal area with large open draining lesions , foul odor.  Area under abdomen although hard is wet and draining, but, less open areas.  Open areas are in perineal region.  Using catheter tip syringe to clean with cleanser because of obesity, making it hard to clean areas.

## 2013-07-20 NOTE — Progress Notes (Signed)
I have seen and examined this patient and agree with plan as outlined by Dr. Aundra Dubin.  Good UOP with increased Lasix dose to 80 IV q8, however she was over her fluid restriction on intake.  Cont with current regimen and follow I's/O's.  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  Will order that today.  Cont with current regimen. Alieu Finnigan A,MD 07/20/2013 11:14 AM

## 2013-07-20 NOTE — Progress Notes (Signed)
[facility] PROGRESS NOTE  [name] [id number] DOB: [birth year] DOA: 07/16/2013 PCP: [name], MD  HPI/Subjective: Feels breathing is easier than yesterday, denies any chest pain or shortness of breath. We will try to ambulate her today.  Assessment/Plan:  CHF, acute on chronic combined -Increased fluid overload, nephrology consulted on managing diuresis. -Continue Imdur, Bystolic and hydralazine. -LVEF is 50-55%, improved from 35% in 2011, has grade 2 diastolic dysfunction.  Acute on chronic renal failure  -With CHF, consistent with cardiorenal syndrome. -Nephrology is managing, continue diuresis because of fluid overload. -Urine output is decreasing, yesterday was 875 mL/24 hours. -Creatinine was 2.8 and 06/13/2013. -Presented with creatinine of 4.6 this time, creatinine increased to 5.6 today. -On Lasix 80 mg twice a day.  UTI -Urine culture is growing enterococcus. Patient apparently on ciprofloxacin. -Blood culture is sterile to date. -Change to vancomycin now and adjust antibiotics according to the sensitivity results. -Changed to vancomycin, especially that patient has multiple skin abscesses, pharmacy to dose  Diabetes mellitus type 2 -Controlled diabetes mellitus type 2 with hemoglobin A1c of 5.7, this indicates mean plasma glucose of 117. -Continue insulin sliding scale.  Hidradenitis suppurativa -Multiple areas of abscesses, with purulent discharge. -Patient is on ciprofloxacin, Change to IV vancomycin. -I will add Hibiclens for wound cleansing.  Rectovaginal fistula -Referred to Dr. Harolyn [facility] as outpatient.  OSA -Placed on CPAP the hospital through nasal mask, tolerated that very well. -Reported that she is not on CPAP at home.  Code Status: Full code Family Communication: Plan discussed with patient Disposition Plan: Remains the patient.   Consultants:  [facility]  Procedures:  None  Antibiotics:  None   Objective: Filed Vitals:   07/20/13 0547  BP: 128/69  Pulse: 62  Temp: 98.2 F (36.8 C)  Resp: 20    Intake/Output Summary (Last 24 hours) at 07/20/13 1034 Last data filed at 07/20/13 0857  Gross per 24 hour  Intake   2280 ml  Output   1350 ml  Net    930 ml   Filed Weights   07/19/13 0705 07/19/13 1149 07/20/13 0547  Weight: 178.7 kg (393 lb 15.4 oz) 175.088 kg (386 lb) 165.79 kg (365 lb 8 oz)    Exam: General: Alert and awake, oriented x3, not in any acute distress. Morbid obesity HEENT: anicteric sclera, pupils reactive to light and accommodation, EOMI CVS: S1-S2 clear, no murmur rubs or gallops Chest: clear to auscultation bilaterally, no wheezing, rales or rhonchi Abdomen: soft nontender, nondistended, normal bowel sounds, no organomegaly Extremities: +1 to +2 pedal edema bilaterally. Neuro: Cranial nerves II-XII intact, no focal neurological deficits  Data Reviewed: Basic Metabolic Panel:  Recent Labs Lab 07/17/13 0750 07/17/13 1534 07/18/13 0500 07/18/13 0851 07/19/13 0500 07/20/13 0715  NA 142 141 142 141 141 140  K 4.1 4.2 4.2 4.3 4.1 4.0  CL 110 109 109 107 108 106  CO2 20 20 21 21 21 22   GLUCOSE 105* 138* 121* 116* 120* 122*  BUN 61* 62* 63* 62* 65* 68*  CREATININE 4.91* 4.95* 5.09* 5.09* 5.52* 5.60*  CALCIUM 8.4 8.4 8.4 8.5 8.5 9.0  MG 2.2  --  2.2  --  2.2 2.2  PHOS  --   --   --   --  4.8*  --    Liver Function Tests:  Recent Labs Lab 07/17/13 0750 07/17/13 1534 07/18/13 0500 07/19/13 0500 07/20/13 0715  AST 8 8 9 7 7   ALT <5 <5 <5 <5 <5  ALKPHOS 70 70 71 76 74  BILITOT 0.3 0.3 0.3 0.3 0.3  PROT 8.5* 8.3 8.5* 8.3 9.1*  ALBUMIN 2.5* 2.5* 2.6* 2.3* 2.6*   No results found for this basename: LIPASE, AMYLASE,  in the last 168 hours No results found for this basename: AMMONIA,  in the last 168 hours CBC:  Recent Labs Lab 07/17/13 0020 07/17/13 0750 07/18/13 0500  07/19/13 0500 07/20/13 0715  WBC 12.1* 12.6* 12.4* 11.7* 13.7*  NEUTROABS  --  9.8* 10.0* 9.3* 11.5*  HGB 10.4* 11.0* 10.8* 11.1* 12.0  HCT 37.0 37.1 37.5 37.5 40.6  MCV 93.0 91.2 93.1 94.0 92.7  PLT 217 233 207 194 218   Cardiac Enzymes:  Recent Labs Lab 07/16/13 1522 07/17/13 0020 07/17/13 0645 07/17/13 1527  TROPONINI <0.30 <0.30 <0.30 <0.30   BNP (last 3 results)  Recent Labs  08/16/12 2049 11/10/12 1724 07/16/13 1522  PROBNP 26806.0* 33908.0* 29655.0*   CBG:  Recent Labs Lab 07/19/13 0717 07/19/13 1112 07/19/13 1639 07/19/13 2133 07/20/13 0559  GLUCAP 97 110* 109* 135* 128*    Recent Results (from the past 240 hour(s))  URINE CULTURE     Status: None   Collection Time    07/17/13 12:29 PM      Result Value Range Status   Specimen Description URINE, CATHETERIZED   Final   Special Requests NONE   Final   Culture  Setup Time     Final   Value: 07/17/2013 16:43     Performed at [place]     Final   Value: >=100,000 COLONIES/ML     Performed at [facility]   Culture     Final   Value: ENTEROCOCCUS SPECIES     Performed at [facility]   Report Status PENDING   Incomplete  CULTURE, BLOOD (ROUTINE X 2)     Status: None   Collection Time    07/17/13  3:27 PM      Result Value Range Status   Specimen Description BLOOD LEFT HAND   Final   Special Requests BOTTLES DRAWN AEROBIC ONLY 10CC   Final   Culture  Setup Time     Final   Value: 07/17/2013 22:26     Performed at [facility]   Culture     Final   Value:        BLOOD CULTURE RECEIVED NO GROWTH TO DATE CULTURE WILL BE HELD FOR 5 DAYS BEFORE ISSUING A FINAL NEGATIVE REPORT     Performed at [facility]   Report Status PENDING   Incomplete  CULTURE, BLOOD (ROUTINE X 2)     Status: None   Collection Time    07/17/13  3:33 PM      Result Value Range Status   Specimen Description BLOOD RIGHT HAND   Final   Special Requests BOTTLES DRAWN  AEROBIC ONLY 5CC   Final   Culture  Setup Time     Final   Value: 07/17/2013 22:26     Performed at [facility]   Culture     Final   Value:        BLOOD CULTURE RECEIVED NO GROWTH TO DATE CULTURE WILL BE HELD FOR 5 DAYS BEFORE ISSUING A FINAL NEGATIVE REPORT     Performed at [facility]   Report Status PENDING   Incomplete  URINE CULTURE     Status: None   Collection Time  07/18/13  8:31 AM      Result Value Range Status   Specimen Description URINE, CATHETERIZED   Final   Special Requests NONE   Final   Culture  Setup Time     Final   Value: 07/18/2013 13:49     Performed at [place] PENDING   Incomplete   Culture     Final   Value: Culture reincubated for better growth     Performed at [facility]   Report Status PENDING   Incomplete     Studies: US Renal  07/19/2013   *RADIOLOGY REPORT*  Clinical Data: Recent lithotripsy.  Rising creatinine.  Concern for obstruction.  RENAL/URINARY TRACT ULTRASOUND COMPLETE  Comparison:  07/02/2013  Findings:  Right Kidney:  Mild cortical thinning and increased renal parenchymal echogenicity.  No renal mass.  No hydronephrosis. Right kidney measures 9.9 cm in length.  Left Kidney:  Mild cortical thinning and increased renal parenchymal echogenicity.  No renal mass.  No hydronephrosis. Left kidney measures 10 cm in length  Bladder:  Bladder not distended and therefore not well evaluated.  Additional findings:  Ascites similar to the prior exam.  IMPRESSION: Findings consistent with medical renal disease with increased renal parenchymal echogenicity.  No hydronephrosis.   Original Report Authenticated By: Lajean Manes, M.D.    Scheduled Meds: . aspirin EC  81 mg Oral q morning - 10a  . calcitRIOL  0.25 mcg Oral Daily  . calcium carbonate  1 tablet Oral BID WC  . DULoxetine  60 mg Oral Daily  . famotidine  20 mg Oral QHS  . feeding supplement  1 Container Oral Q24H  . ferrous sulfate  325 mg  Oral BID  . folic acid  1 mg Oral Daily  . furosemide  80 mg Intravenous Q8H  . heparin  5,000 Units Subcutaneous Q8H  . hydrALAZINE  25 mg Oral Q8H  . insulin aspart  0-15 Units Subcutaneous TID WC  . insulin aspart  0-5 Units Subcutaneous QHS  . isosorbide mononitrate  30 mg Oral q morning - 10a  . levothyroxine  25 mcg Oral QAC breakfast  . magnesium oxide  400 mg Oral BID  . nebivolol  10 mg Oral BID  . saccharomyces boulardii  250 mg Oral BID  . senna-docusate  1 tablet Oral BID  . simvastatin  20 mg Oral q1800  . sodium bicarbonate  650 mg Oral BID  . sodium chloride  3 mL Intravenous Q12H  . vancomycin  1,500 mg Intravenous Q48H  . zinc sulfate  220 mg Oral Daily   Continuous Infusions:   Principal Problem:   Diastolic CHF Active Problems:   Morbid obesity   GERD   DM type 2 (diabetes mellitus, type 2)   Hypertension   Renal failure (ARF), acute on chronic   SOB (shortness of breath)   OSA (obstructive sleep apnea)   UTI (urinary tract infection)    Time spent: 35 minutes    [facility] Pager [phone]. If 7PM-7AM, please contact night-coverage at [email or web address], password [facility] 07/20/2013, 10:34 AM  LOS: 4 days
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
## 2013-07-21 ENCOUNTER — Encounter (HOSPITAL_COMMUNITY): Payer: BC Managed Care – PPO

## 2013-07-21 LAB — CBC WITH DIFFERENTIAL/PLATELET
Basophils Relative: 0 % (ref 0–1)
Eosinophils Absolute: 0.7 10*3/uL (ref 0.0–0.7)
Eosinophils Relative: 6 % — ABNORMAL HIGH (ref 0–5)
Hemoglobin: 11.6 g/dL — ABNORMAL LOW (ref 12.0–15.0)
MCH: 26.9 pg (ref 26.0–34.0)
MCHC: 28.6 g/dL — ABNORMAL LOW (ref 30.0–36.0)
Monocytes Relative: 5 % (ref 3–12)
Neutrophils Relative %: 82 % — ABNORMAL HIGH (ref 43–77)

## 2013-07-21 LAB — GLUCOSE, CAPILLARY
Glucose-Capillary: 104 mg/dL — ABNORMAL HIGH (ref 70–99)
Glucose-Capillary: 106 mg/dL — ABNORMAL HIGH (ref 70–99)
Glucose-Capillary: 124 mg/dL — ABNORMAL HIGH (ref 70–99)

## 2013-07-21 LAB — URINE CULTURE
Colony Count: >=100000
Colony Count: 60000

## 2013-07-21 LAB — RENAL FUNCTION PANEL
BUN: 69 mg/dL — ABNORMAL HIGH (ref 6–23)
CO2: 23 mEq/L (ref 19–32)
Chloride: 108 mEq/L (ref 96–112)
Glucose, Bld: 105 mg/dL — ABNORMAL HIGH (ref 70–99)
Potassium: 4.2 mEq/L (ref 3.5–5.1)

## 2013-07-21 LAB — SELENIUM SERUM: Selenium: 88 mcg/L (ref 63–160)

## 2013-07-21 MED ORDER — LEVOFLOXACIN 750 MG PO TABS
750.0000 mg | ORAL_TABLET | ORAL | Status: DC
  Administered 2013-07-21: 750 mg via ORAL
  Filled 2013-07-21 (×2): qty 1

## 2013-07-21 NOTE — Progress Notes (Signed)
1. Acute on chronic renal failure   -Likely multifactorial given significant volume overload (i.e acute on chronic combined CHF) and hx of GU obstruction due to stones with h/o GU stents, volume depletion.    Stop Furosemide for now  Subjective: Interval History: nothing new hearing aid battery dead.  Objective: Vital signs in last 24 hours: Temp:  [98.1 F (36.7 C)] 98.1 F (36.7 C) (08/24 2032) Pulse Rate:  [58-92] 58 (08/25 0929) Resp:  [18-21] 18 (08/25 0929) BP: (108-140)/(36-76) 124/52 mmHg (08/25 0929) SpO2:  [98 %-100 %] 98 % (08/25 0929) Weight change:   Intake/Output from previous day: 08/24 0701 - 08/25 0700 In: 960 [P.O.:960] Out: 1750 [Urine:1750] Intake/Output this shift: Total I/O In: 120 [P.O.:120] Out: 950 [Urine:950]  General appearance: alert and cooperative Resp: clear to auscultation bilaterally Chest wall: no tenderness Cardio: regular rate and rhythm, S1, S2 normal, no murmur, click, rub or gallop Extremities: edema 2+ HOH  Lab Results:  Recent Labs  07/20/13 0715 07/21/13 0650  WBC 13.7* 12.0*  HGB 12.0 11.6*  HCT 40.6 40.5  PLT 218 202   BMET:  Recent Labs  07/20/13 0715 07/21/13 0650  NA 140 142  K 4.0 4.2  CL 106 108  CO2 22 23  GLUCOSE 122* 105*  BUN 68* 69*  CREATININE 5.60* 5.66*  CALCIUM 9.0 9.2   No results found for this basename: PTH,  in the last 72 hours Iron Studies: No results found for this basename: IRON, TIBC, TRANSFERRIN, FERRITIN,  in the last 72 hours Studies/Results: US Renal  07/19/2013   *RADIOLOGY REPORT*  Clinical Data: Recent lithotripsy.  Rising creatinine.  Concern for obstruction.  RENAL/URINARY TRACT ULTRASOUND COMPLETE  Comparison:  07/02/2013  Findings:  Right Kidney:  Mild cortical thinning and increased renal parenchymal echogenicity.  No renal mass.  No hydronephrosis. Right kidney measures 9.9 cm in length.  Left Kidney:  Mild cortical thinning and increased renal parenchymal echogenicity.  No  renal mass.  No hydronephrosis. Left kidney measures 10 cm in length  Bladder:  Bladder not distended and therefore not well evaluated.  Additional findings:  Ascites similar to the prior exam.  IMPRESSION: Findings consistent with medical renal disease with increased renal parenchymal echogenicity.  No hydronephrosis.   Original Report Authenticated By: Lajean Manes, M.D.   Scheduled: . aspirin EC  81 mg Oral q morning - 10a  . calcitRIOL  0.25 mcg Oral Daily  . calcium carbonate  1 tablet Oral BID WC  . DULoxetine  60 mg Oral Daily  . famotidine  20 mg Oral QHS  . feeding supplement  1 Container Oral Q24H  . ferrous sulfate  325 mg Oral BID  . folic acid  1 mg Oral Daily  . heparin  5,000 Units Subcutaneous Q8H  . hydrALAZINE  25 mg Oral Q8H  . insulin aspart  0-15 Units Subcutaneous TID WC  . insulin aspart  0-5 Units Subcutaneous QHS  . isosorbide mononitrate  30 mg Oral q morning - 10a  . levothyroxine  25 mcg Oral QAC breakfast  . magnesium oxide  400 mg Oral BID  . nebivolol  10 mg Oral BID  . saccharomyces boulardii  250 mg Oral BID  . senna-docusate  1 tablet Oral BID  . simvastatin  20 mg Oral q1800  . sodium bicarbonate  650 mg Oral BID  . sodium chloride  3 mL Intravenous Q12H  . vancomycin  1,500 mg Intravenous Q48H  . zinc sulfate  220 mg Oral Daily     LOS: 5 days   Ayushi Pla C 07/21/2013,12:57 PM

## 2013-07-21 NOTE — Progress Notes (Signed)
Placed pt. On cpap. Pt. Is tolerating well at this time.  

## 2013-07-21 NOTE — Evaluation (Signed)
Occupational Therapy Evaluation Patient Details Name: [name] MRN: [id number] DOB: Apr 26, 1961 Today's Date: 07/21/2013 Time: [id number] OT Time Calculation (min): 20 min  OT Assessment / Plan / Recommendation History of present illness [name] is an 52 y.o. female with multiple medical problems including DM, morbid obesity, asthma, CHF (Epic said systolic, but EF 60 percent, so likely diastolic), CKD 3, Hypothyroidism presents to the ERWith 3 days hx of increase shortness of breath, chest tightness, and some orthopnea.  She was having some wheezing, and had seen her pulmonologist, who tx her for possible GERD.  She denied fever, chills, or coughs.  But  Admitted to have some orthopnea.  Her legs have been swelling more, but she has no calf tenderness.  Work up in the ER included a CXR which showed cardiomegaly and borderline CHF.  Her WBC was 14K, and her Cr is 4.4.She has multiple peri area wounds that are draining.   Clinical Impression   Pt admitted with above. Pt currently with functional limitations due to the deficits listed below (see OT Problem List).  Pt will benefit from skilled OT to increase their safety and independence with ADL and functional mobility for ADL to facilitate discharge to venue listed below.       OT Assessment  Patient needs continued OT Services    Follow Up Recommendations  SNF    Barriers to Discharge Decreased caregiver support          Frequency  Min 2X/week    Precautions / Restrictions Precautions Precautions: Fall Restrictions Weight Bearing Restrictions: No       ADL  Eating/Feeding: Independent Where Assessed - Eating/Feeding: Bed level Grooming: Set up;Supervision/safety Where Assessed - Grooming: Supine, head of bed up Upper Body Bathing: Maximal assistance (due to obsesity and positioning) Where Assessed - Upper Body Bathing: Supine, head of bed up Lower Body Bathing: +1 Total assistance Where Assessed - Lower Body Bathing:  Supine, head of bed up;Supine, head of bed flat;Rolling right and/or left Upper Body Dressing: Maximal assistance Where Assessed - Upper Body Dressing: Supine, head of bed up;Supine, head of bed flat;Rolling right and/or left Lower Body Dressing: +1 Total assistance Where Assessed - Lower Body Dressing: Supine, head of bed up;Supine, head of bed flat;Rolling right and/or left Toilet Transfer: +2 Total assistance Toilet Transfer: Patient Percentage: 50% Toilet Transfer Method: Sit to stand Toilet Transfer Equipment:  (Bed to recliner going to pt's left) Toileting - Clothing Manipulation and Hygiene: +1 Total assistance Where Assessed - Camera operator Manipulation and Hygiene: Standing Equipment Used: Rolling walker Transfers/Ambulation Related to ADLs: Total A +2 (sit to stand and stand to sit)    OT Diagnosis: Generalized weakness;Acute pain  OT Problem List: Decreased strength;Decreased activity tolerance;Impaired balance (sitting and/or standing);Pain;Decreased knowledge of use of DME or AE OT Treatment Interventions: Self-care/ADL training;Balance training;Therapeutic activities;DME and/or AE instruction;Patient/family education   OT Goals(Current goals can be found in the care plan section) Acute Rehab OT Goals OT Goal Formulation: With patient Potential to Achieve Goals: Good  Visit Information  Last OT Received On: 07/21/13 Assistance Needed: +2 PT/OT Co-Evaluation/Treatment: Yes History of Present Illness: [name] is an 52 y.o. female with multiple medical problems including DM, morbid obesity, asthma, CHF (Epic said systolic, but EF 60 percent, so likely diastolic), CKD 3, Hypothyroidism presents to the ERWith 3 days hx of increase shortness of breath, chest tightness, and some orthopnea.  She was having some wheezing, and had seen her pulmonologist, who tx her for possible  GERD.  She denied fever, chills, or coughs.  But  Admitted to have some orthopnea.  Her legs have  been swelling more, but she has no calf tenderness.  Work up in the ER included a CXR which showed cardiomegaly and borderline CHF.  Her WBC was 14K, and her Cr is 4.4.She has multiple peri area wounds that are draining.       Prior [place] expects to be discharged to:: Private residence Living Arrangements: Spouse/significant other Available Help at Discharge: Family;Available PRN/intermittently Type of Home: House Home Access: Stairs to enter Entrance Stairs-Rails: Left Home Layout: One level Home Equipment: None Additional Comments: patient states that mom helps her with her genital wound care Prior Function Level of Independence: Independent Comments: works as an Web designer at [employer]: HOH (severe) Dominant Hand: Right         Vision/Perception Vision - History Patient Visual Report: No change from baseline   Cognition  Cognition Arousal/Alertness: Awake/alert Behavior During Therapy: WFL for tasks assessed/performed Overall Cognitive Status: Within Functional Limits for tasks assessed    Extremity/Trunk Assessment Upper Extremity Assessment Upper Extremity Assessment: Overall WFL for tasks assessed     Mobility Bed Mobility Bed Mobility: Supine to Sit;Sitting - Scoot to Edge of Bed Supine to Sit: 1: +2 Total assist Supine to Sit: Patient Percentage: 40% Sitting - Scoot to Edge of Bed: 1: +2 Total assist Sitting - Scoot to Edge of Bed: Patient Percentage: 40% Transfers Transfers: Sit to Stand;Stand to Sit Sit to Stand: 1: +2 Total assist;With upper extremity assist;Without upper extremity assist;From bed Sit to Stand: Patient Percentage: 50% Stand to Sit: 1: +2 Total assist;Without upper extremity assist;To chair/3-in-1 Stand to Sit: Patient Percentage: 50%           End of Session OT - End of Session Equipment Utilized During Treatment: Rolling walker Patient left: in  chair;with call bell/phone within reach Nurse Communication:  (NT in room to A [place] with getting her up to recliner)       Almon Register [id number] 07/21/2013, 12:54 PM

## 2013-07-21 NOTE — Progress Notes (Signed)
PT complain of sacral pain PRN given, move Pt from chair to bed, dressing change and wounds clean with Hibiclens.  Will continue to monitor

## 2013-07-21 NOTE — Progress Notes (Signed)
[facility] PROGRESS NOTE  [name] [id number] DOB: 1961-05-29 DOA: 07/16/2013 PCP: [name], MD  HPI/Subjective: Continues to have better breathing, has decent urine output yesterday. I offered this surgical consultation for her cellulitis/hydradenitis suppurative, patient declined.  Assessment/Plan:  CHF, acute on chronic combined -Increased fluid overload, nephrology consulted on managing diuresis. -Continue Imdur, Bystolic and hydralazine. -LVEF is 50-55%, improved from 35% in 2011, has grade 2 diastolic dysfunction.  Acute on chronic renal failure  -With CHF, consistent with cardiorenal syndrome. -Nephrology is managing, continue diuresis because of fluid overload. -Urine output is decreasing, yesterday was 875 mL/24 hours. -Creatinine was 2.8 and 06/13/2013. -Presented with creatinine of 4.6 this time, creatinine increased to 5.6 today. -On Lasix 80 mg twice a day.  UTI -Urine culture grew VRE, patient is on vancomycin will switch to levofloxacin. -Blood culture is sterile to date. -I will asked the pharmacy to dose vancomycin as she does have renal failure.  Diabetes mellitus type 2 -Controlled diabetes mellitus type 2 with hemoglobin A1c of 5.7, this indicates mean plasma glucose of 117. -Continue insulin sliding scale.  Hidradenitis suppurativa -Multiple areas of abscesses, with purulent discharge. -Patient is on ciprofloxacin, Change to IV vancomycin. -I will add Hibiclens for wound cleansing. Surgical consultation.  Rectovaginal fistula -Referred to Dr. Harolyn Rutherford [facility] as outpatient.  OSA -Placed on CPAP the hospital through nasal mask, tolerated that very well. -Reported that she is not on CPAP at home.  Code Status: Full code Family Communication: Plan discussed with patient Disposition Plan: Remains the patient.   Consultants:  [facility]  Procedures:  None  Antibiotics:  None   Objective: Filed Vitals:    07/21/13 0929  BP: 124/52  Pulse: 58  Temp:   Resp: 18    Intake/Output Summary (Last 24 hours) at 07/21/13 1220 Last data filed at 07/21/13 1200  Gross per 24 hour  Intake    720 ml  Output   2200 ml  Net  -1480 ml   Filed Weights   07/19/13 0705 07/19/13 1149 07/20/13 0547  Weight: 178.7 kg (393 lb 15.4 oz) 175.088 kg (386 lb) 165.79 kg (365 lb 8 oz)    Exam: General: Alert and awake, oriented x3, not in any acute distress. Morbid obesity HEENT: anicteric sclera, pupils reactive to light and accommodation, EOMI CVS: S1-S2 clear, no murmur rubs or gallops Chest: clear to auscultation bilaterally, no wheezing, rales or rhonchi Abdomen: soft nontender, nondistended, normal bowel sounds, no organomegaly Extremities: +1 to +2 pedal edema bilaterally. Neuro: Cranial nerves II-XII intact, no focal neurological deficits  Data Reviewed: Basic Metabolic Panel:  Recent Labs Lab 07/17/13 0750  07/18/13 0500 07/18/13 0851 07/19/13 0500 07/20/13 0715 07/21/13 0650  NA 142  < > 142 141 141 140 142  K 4.1  < > 4.2 4.3 4.1 4.0 4.2  CL 110  < > 109 107 108 106 108  CO2 20  < > 21 21 21 22 23   GLUCOSE 105*  < > 121* 116* 120* 122* 105*  BUN 61*  < > 63* 62* 65* 68* 69*  CREATININE 4.91*  < > 5.09* 5.09* 5.52* 5.60* 5.66*  CALCIUM 8.4  < > 8.4 8.5 8.5 9.0 9.2  MG 2.2  --  2.2  --  2.2 2.2 2.3  PHOS  --   --   --   --  4.8*  --  5.1*  < > = values in this interval not displayed. Liver Function Tests:  Recent Labs  Lab 07/17/13 0750 07/17/13 1534 07/18/13 0500 07/19/13 0500 07/20/13 0715 07/21/13 0650  AST 8 8 9 7 7   --   ALT <5 <5 <5 <5 <5  --   ALKPHOS 70 70 71 76 74  --   BILITOT 0.3 0.3 0.3 0.3 0.3  --   PROT 8.5* 8.3 8.5* 8.3 9.1*  --   ALBUMIN 2.5* 2.5* 2.6* 2.3* 2.6* 2.4*   No results found for this basename: LIPASE, AMYLASE,  in the last 168 hours No results found for this basename: AMMONIA,  in the last 168 hours CBC:  Recent Labs Lab 07/17/13 0750  07/18/13 0500 07/19/13 0500 07/20/13 0715 07/21/13 0650  WBC 12.6* 12.4* 11.7* 13.7* 12.0*  NEUTROABS 9.8* 10.0* 9.3* 11.5* 9.8*  HGB 11.0* 10.8* 11.1* 12.0 11.6*  HCT 37.1 37.5 37.5 40.6 40.5  MCV 91.2 93.1 94.0 92.7 93.8  PLT 233 207 194 218 202   Cardiac Enzymes:  Recent Labs Lab 07/16/13 1522 07/17/13 0020 07/17/13 0645 07/17/13 1527  TROPONINI <0.30 <0.30 <0.30 <0.30   BNP (last 3 results)  Recent Labs  08/16/12 2049 11/10/12 1724 07/16/13 1522  PROBNP 26806.0* 33908.0* 29655.0*   CBG:  Recent Labs Lab 07/20/13 0559 07/20/13 1106 07/20/13 1554 07/20/13 2103 07/21/13 1121  GLUCAP 128* 107* 126* 123* 106*    Recent Results (from the past 240 hour(s))  URINE CULTURE     Status: None   Collection Time    07/17/13 12:29 PM      Result Value Range Status   Specimen Description URINE, CATHETERIZED   Final   Special Requests NONE   Final   Culture  Setup Time     Final   Value: 07/17/2013 16:43     Performed at [facility]     Final   Value: >=100,000 COLONIES/ML     Performed at [facility]   Culture     Final   Value: VANCOMYCIN RESISTANT ENTEROCOCCUS ISOLATED     Note: CRITICAL RESULT CALLED TO, READ BACK BY AND VERIFIED WITH: ALLEN T. @ 11:00 63M 8.25.14 BY DESIS     Performed at [facility]   Report Status 07/21/2013 FINAL   Final   Organism ID, Bacteria VANCOMYCIN RESISTANT ENTEROCOCCUS ISOLATED   Final  CULTURE, BLOOD (ROUTINE X 2)     Status: None   Collection Time    07/17/13  3:27 PM      Result Value Range Status   Specimen Description BLOOD LEFT HAND   Final   Special Requests BOTTLES DRAWN AEROBIC ONLY 10CC   Final   Culture  Setup Time     Final   Value: 07/17/2013 22:26     Performed at [facility]   Culture     Final   Value:        BLOOD CULTURE RECEIVED NO GROWTH TO DATE CULTURE WILL BE HELD FOR 5 DAYS BEFORE ISSUING A FINAL NEGATIVE REPORT     Performed at [facility]    Report Status PENDING   Incomplete  CULTURE, BLOOD (ROUTINE X 2)     Status: None   Collection Time    07/17/13  3:33 PM      Result Value Range Status   Specimen Description BLOOD RIGHT HAND   Final   Special Requests BOTTLES DRAWN AEROBIC ONLY 5CC   Final   Culture  Setup Time     Final   Value: 07/17/2013  22:26     Performed at [facility]     Final   Value:        BLOOD CULTURE RECEIVED NO GROWTH TO DATE CULTURE WILL BE HELD FOR 5 DAYS BEFORE ISSUING A FINAL NEGATIVE REPORT     Performed at [facility]   Report Status PENDING   Incomplete  URINE CULTURE     Status: None   Collection Time    07/18/13  8:31 AM      Result Value Range Status   Specimen Description URINE, CATHETERIZED   Final   Special Requests NONE   Final   Culture  Setup Time     Final   Value: 07/18/2013 13:49     Performed at [facility]     Final   Value: 60,000 COLONIES/ML     Performed at [facility]   Culture     Final   Value: ENTEROCOCCUS SPECIES     Note: SUSCEPTIBILITIES PERFORMED ON PREVIOUS CULTURE WITHIN THE LAST 5 DAYS.     Performed at [facility]   Report Status 07/21/2013 FINAL   Final     Studies: US Renal  07/19/2013   *RADIOLOGY REPORT*  Clinical Data: Recent lithotripsy.  Rising creatinine.  Concern for obstruction.  RENAL/URINARY TRACT ULTRASOUND COMPLETE  Comparison:  07/02/2013  Findings:  Right Kidney:  Mild cortical thinning and increased renal parenchymal echogenicity.  No renal mass.  No hydronephrosis. Right kidney measures 9.9 cm in length.  Left Kidney:  Mild cortical thinning and increased renal parenchymal echogenicity.  No renal mass.  No hydronephrosis. Left kidney measures 10 cm in length  Bladder:  Bladder not distended and therefore not well evaluated.  Additional findings:  Ascites similar to the prior exam.  IMPRESSION: Findings consistent with medical renal disease with increased renal parenchymal  echogenicity.  No hydronephrosis.   Original Report Authenticated By: Lajean Manes, M.D.    Scheduled Meds: . aspirin EC  81 mg Oral q morning - 10a  . calcitRIOL  0.25 mcg Oral Daily  . calcium carbonate  1 tablet Oral BID WC  . DULoxetine  60 mg Oral Daily  . famotidine  20 mg Oral QHS  . feeding supplement  1 Container Oral Q24H  . ferrous sulfate  325 mg Oral BID  . folic acid  1 mg Oral Daily  . heparin  5,000 Units Subcutaneous Q8H  . hydrALAZINE  25 mg Oral Q8H  . insulin aspart  0-15 Units Subcutaneous TID WC  . insulin aspart  0-5 Units Subcutaneous QHS  . isosorbide mononitrate  30 mg Oral q morning - 10a  . levothyroxine  25 mcg Oral QAC breakfast  . magnesium oxide  400 mg Oral BID  . nebivolol  10 mg Oral BID  . saccharomyces boulardii  250 mg Oral BID  . senna-docusate  1 tablet Oral BID  . simvastatin  20 mg Oral q1800  . sodium bicarbonate  650 mg Oral BID  . sodium chloride  3 mL Intravenous Q12H  . vancomycin  1,500 mg Intravenous Q48H  . zinc sulfate  220 mg Oral Daily   Continuous Infusions:   Principal Problem:   Diastolic CHF Active Problems:   Morbid obesity   GERD   DM type 2 (diabetes mellitus, type 2)   Hypertension   Renal failure (ARF), acute on chronic   SOB (shortness of breath)   OSA (  obstructive sleep apnea)   UTI (urinary tract infection)    Time spent: 35 minutes    [facility] Pager [phone]. If 7PM-7AM, please contact night-coverage at [email or web address], password [facility] 07/21/2013, 12:20 PM  LOS: 5 days

## 2013-07-21 NOTE — Progress Notes (Signed)
Lab call + VRE urine culture. Pt place in contacted by RN

## 2013-07-21 NOTE — Progress Notes (Signed)
ANTIBIOTIC CONSULT NOTE - FOLLOW UP  Pharmacy Consult for Levaquin Indication: VRE in Urine, Hidradenitis suppurativa   Allergies  Allergen Reactions  . Amoxicillin-Pot Clavulanate Diarrhea  . Rosiglitazone Maleate     REACTION: swelling  . Amoxicillin Rash    Tolerated Zosyn 01/2013.  [name]    Patient Measurements: Height: 5' 6.53" (169 cm) Weight: 365 lb 8 oz (165.79 kg) IBW/kg (Calculated) : 60.53  Vital Signs: BP: 124/52 mmHg (08/25 0929) Pulse Rate: 58 (08/25 0929) Intake/Output from previous day: 08/24 0701 - 08/25 0700 In: 960 [P.O.:960] Out: 1750 [Urine:1750] Intake/Output from this shift: Total I/O In: 860 [P.O.:360; IV Piggyback:500] Out: 1070 [Urine:1070]  Labs:  Recent Labs  07/19/13 0500 07/20/13 0715 07/21/13 0650  WBC 11.7* 13.7* 12.0*  HGB 11.1* 12.0 11.6*  PLT 194 218 202  CREATININE 5.52* 5.60* 5.66*   Estimated Creatinine Clearance: 19 ml/min (by C-G formula based on Cr of 5.66). No results found for this basename: VANCOTROUGH, Corlis Leak, VANCORANDOM, [place], [place], [place], [place], [place], [place], AMIKACINPEAK, AMIKACINTROU, AMIKACIN,  in the last 72 hours   Microbiology: Recent Results (from the past 720 hour(s))  URINE CULTURE     Status: None   Collection Time    07/17/13 12:29 PM      Result Value Range Status   Specimen Description URINE, CATHETERIZED   Final   Special Requests NONE   Final   Culture  Setup Time     Final   Value: 07/17/2013 16:43     Performed at [facility]     Final   Value: >=100,000 COLONIES/ML     Performed at [facility]   Culture     Final   Value: VANCOMYCIN RESISTANT ENTEROCOCCUS ISOLATED     Note: CRITICAL RESULT CALLED TO, READ BACK BY AND VERIFIED WITH: ALLEN T. @ 11:00 52M 8.25.14 BY DESIS     Performed at [facility]   Report Status 07/21/2013 FINAL   Final   Organism ID, Bacteria VANCOMYCIN RESISTANT ENTEROCOCCUS ISOLATED   Final   CULTURE, BLOOD (ROUTINE X 2)     Status: None   Collection Time    07/17/13  3:27 PM      Result Value Range Status   Specimen Description BLOOD LEFT HAND   Final   Special Requests BOTTLES DRAWN AEROBIC ONLY 10CC   Final   Culture  Setup Time     Final   Value: 07/17/2013 22:26     Performed at [facility]   Culture     Final   Value:        BLOOD CULTURE RECEIVED NO GROWTH TO DATE CULTURE WILL BE HELD FOR 5 DAYS BEFORE ISSUING A FINAL NEGATIVE REPORT     Performed at [facility]   Report Status PENDING   Incomplete  CULTURE, BLOOD (ROUTINE X 2)     Status: None   Collection Time    07/17/13  3:33 PM      Result Value Range Status   Specimen Description BLOOD RIGHT HAND   Final   Special Requests BOTTLES DRAWN AEROBIC ONLY 5CC   Final   Culture  Setup Time     Final   Value: 07/17/2013 22:26     Performed at [facility]   Culture     Final   Value:        BLOOD CULTURE RECEIVED NO GROWTH TO DATE CULTURE WILL BE HELD FOR 5  DAYS BEFORE ISSUING A FINAL NEGATIVE REPORT     Performed at [facility]   Report Status PENDING   Incomplete  URINE CULTURE     Status: None   Collection Time    07/18/13  8:31 AM      Result Value Range Status   Specimen Description URINE, CATHETERIZED   Final   Special Requests NONE   Final   Culture  Setup Time     Final   Value: 07/18/2013 13:49     Performed at [place]     Final   Value: 60,000 COLONIES/ML     Performed at [facility]   Culture     Final   Value: ENTEROCOCCUS SPECIES     Note: SUSCEPTIBILITIES PERFORMED ON PREVIOUS CULTURE WITHIN THE LAST 5 DAYS.     Performed at [facility]   Report Status 07/21/2013 FINAL   Final    Anti-infectives   Start     Dose/Rate Route Frequency Ordered Stop   07/19/13 1200  vancomycin (VANCOCIN) 1,500 mg in sodium chloride 0.9 % 500 mL IVPB  Status:  Discontinued     1,500 mg 250 mL/hr over 120 Minutes Intravenous  Every 48 hours 07/19/13 1122 07/21/13 1410   07/18/13 1000  ciprofloxacin (CIPRO) IVPB 400 mg  Status:  Discontinued     400 mg 200 mL/hr over 60 Minutes Intravenous Every 24 hours 07/18/13 0837 07/19/13 1055      Assessment: 52 year old female on empiric therapy with Vancomycin for UTI and hidradenitis suppurativa.  The VRE in her urine is Vancomycin-resistant but susceptible to Levaquin.  She also has acute on chronic renal insufficiency.  Plan:  Spoke with Dr. Hartford Poli - will change Vancomycin to Levaquin. Levaquin 750mg  PO q48h is appropriate for indications and her current renal function Will monitor renal function and adjust as needed.  Legrand Como, Pharm.D., BCPS, [place] Clinical Pharmacist Phone: [phone] or [phone] Pager: [phone] 07/21/2013, 2:15 PM

## 2013-07-21 NOTE — Progress Notes (Signed)
Pt's wounds cleaned with hibiclens, large purulent brownish discharges foul smelling,noted specially at the perineal area.

## 2013-07-21 NOTE — Progress Notes (Signed)
IV teaM page, unable to find vein for IV

## 2013-07-21 NOTE — Progress Notes (Signed)
Physical Therapy Treatment Patient Details Name: [name] MRN: [id number] DOB: 06-17-1961 Today's Date: 07/21/2013 Time: [id number] PT Time Calculation (min): 24 min  PT Assessment / Plan / Recommendation  History of Present Illness [name] is an 52 y.o. female with multiple medical problems including DM, morbid obesity, asthma, CHF (Epic said systolic, but EF 60 percent, so likely diastolic), CKD 3, Hypothyroidism presents to the ERWith 3 days hx of increase shortness of breath, chest tightness, and some orthopnea.  She was having some wheezing, and had seen her pulmonologist, who tx her for possible GERD.  She denied fever, chills, or coughs.  But  Admitted to have some orthopnea.  Her legs have been swelling more, but she has no calf tenderness.  Work up in the ER included a CXR which showed cardiomegaly and borderline CHF.  Her WBC was 14K, and her Cr is 4.4.She has multiple peri area wounds that are draining.   PT Comments   Pt slowly making progress with mobility but she did tolerate OOB>recliner today.  Pt limited by pain, decreased activity tolerance, & generalized weakness.  Cont with current POC & d/c recommendation   Follow Up Recommendations  SNF     Does the patient have the potential to tolerate intense rehabilitation     Barriers to Discharge        Equipment Recommendations  Other (comment)    Recommendations for Other Services    Frequency Min 3X/week   Progress towards PT Goals Progress towards PT goals: Progressing toward goals  Plan Current plan remains appropriate    Precautions / Restrictions Precautions Precautions: Fall Restrictions Weight Bearing Restrictions: No   Pertinent Vitals/Pain C/o pain Lt side where wound is located    Mobility  Bed Mobility Bed Mobility: Supine to Sit;Sitting - Scoot to Edge of Bed Supine to Sit: 1: +2 Total assist;With rails Supine to Sit: Patient Percentage: 40% Sitting - Scoot to Edge of Bed: 1: +2 Total  assist Sitting - Scoot to Edge of Bed: Patient Percentage: 40% Details for Bed Mobility Assistance: cues for technique.  Pt able to initiate movement with LE's to EOB & begin pushing shoulders/trunk to sitting upright but require total (A) to complete transitional movements.  Pt requires total (A) to maintain balance while sitting EOB due to leaning heavily to Rt side-- unable to evenly distribute weight through hips due to pain on Lt side Transfers Transfers: Sit to Stand;Stand to Sit;Stand Pivot Transfers Sit to Stand: 1: +2 Total assist;From bed Sit to Stand: Patient Percentage: 50% Stand to Sit: 1: +2 Total assist;With armrests;To chair/3-in-1;To bed Stand to Sit: Patient Percentage: 50% Stand Pivot Transfers: 1: +2 Total assist Stand Pivot Transfers: Patient Percentage: 60% Details for Transfer Assistance: Pt pulls herself to standing with hands starting on RW.  (A) to lift hips up off bed, balance, & control descent to recliner.  Pt able to take pivotal steps from bed>recliner.   Ambulation/Gait Ambulation/Gait Assistance: Not tested (comment)      PT Goals (current goals can now be found in the care plan section) Acute Rehab PT Goals PT Goal Formulation: With patient Time For Goal Achievement: 08/02/13 Potential to Achieve Goals: Fair  Visit Information  Last PT Received On: 07/21/13 Assistance Needed: +2 History of Present Illness: [name] is an 52 y.o. female with multiple medical problems including DM, morbid obesity, asthma, CHF (Epic said systolic, but EF 60 percent, so likely diastolic), CKD 3, Hypothyroidism presents to the ERWith 3 days hx  of increase shortness of breath, chest tightness, and some orthopnea.  She was having some wheezing, and had seen her pulmonologist, who tx her for possible GERD.  She denied fever, chills, or coughs.  But  Admitted to have some orthopnea.  Her legs have been swelling more, but she has no calf tenderness.  Work up in the ER included a  CXR which showed cardiomegaly and borderline CHF.  Her WBC was 14K, and her Cr is 4.4.She has multiple peri area wounds that are draining.    Subjective Data      Cognition  Cognition Arousal/Alertness: Awake/alert Behavior During Therapy: WFL for tasks assessed/performed Overall Cognitive Status: Within Functional Limits for tasks assessed    Balance     End of Session PT - End of Session Equipment Utilized During Treatment: Gait belt Activity Tolerance: Patient limited by fatigue;Patient limited by pain Patient left: in chair;with call bell/phone within reach Nurse Communication: Mobility status   GP     [name] 07/21/2013, 1:51 PM  [name], [name] 07/21/2013

## 2013-07-22 DIAGNOSIS — N186 End stage renal disease: Secondary | ICD-10-CM

## 2013-07-22 LAB — RENAL FUNCTION PANEL
Chloride: 109 mEq/L (ref 96–112)
GFR calc Af Amer: 9 mL/min — ABNORMAL LOW (ref >90)
Glucose, Bld: 105 mg/dL — ABNORMAL HIGH (ref 70–99)
Phosphorus: 4.8 mg/dL — ABNORMAL HIGH (ref 2.3–4.6)
Potassium: 4.3 mEq/L (ref 3.5–5.1)
Sodium: 144 mEq/L (ref 135–145)

## 2013-07-22 LAB — CBC WITH DIFFERENTIAL/PLATELET
Basophils Absolute: 0 10*3/uL (ref 0.0–0.1)
Eosinophils Absolute: 0.8 10*3/uL — ABNORMAL HIGH (ref 0.0–0.7)
Lymphocytes Relative: 6 % — ABNORMAL LOW (ref 12–46)
MCHC: 29.7 g/dL — ABNORMAL LOW (ref 30.0–36.0)
Monocytes Relative: 6 % (ref 3–12)
Platelets: 213 10*3/uL (ref 150–400)
RDW: 18.7 % — ABNORMAL HIGH (ref 11.5–15.5)
WBC: 13.6 10*3/uL — ABNORMAL HIGH (ref 4.0–10.5)

## 2013-07-22 LAB — MAGNESIUM: Magnesium: 2.3 mg/dL (ref 1.5–2.5)

## 2013-07-22 LAB — GLUCOSE, CAPILLARY
Glucose-Capillary: 136 mg/dL — ABNORMAL HIGH (ref 70–99)
Glucose-Capillary: 92 mg/dL (ref 70–99)
Glucose-Capillary: 112 mg/dL — ABNORMAL HIGH (ref 70–99)

## 2013-07-22 LAB — HCV RNA QUANT

## 2013-07-22 NOTE — Progress Notes (Signed)
Utilization Review Completed.   Shahad Mazurek, RN, BSN Nurse Case Manager  [phone]  

## 2013-07-22 NOTE — Progress Notes (Signed)
Patient refused cpap tonight. RT will continue to monitor. 

## 2013-07-22 NOTE — Progress Notes (Signed)
Physical Therapy Treatment Patient Details Name: Brigette Burrous MRN: [id number] DOB: [birth year] Today's Date: 07/22/2013 Time: [id number] PT Time Calculation (min): 23 min  PT Assessment / Plan / Recommendation  History of Present Illness Anieyah Arizpe is an 52 y.o. female with multiple medical problems including DM, morbid obesity, asthma, CHF (Epic said systolic, but EF 60 percent, so likely diastolic), CKD 3, Hypothyroidism presents to the ERWith 3 days hx of increase shortness of breath, chest tightness, and some orthopnea.  She was having some wheezing, and had seen her pulmonologist, who tx her for possible GERD.  She denied fever, chills, or coughs.  But  Admitted to have some orthopnea.  Her legs have been swelling more, but she has no calf tenderness.  Work up in the ER included a CXR which showed cardiomegaly and borderline CHF.  Her WBC was 14K, and her Cr is 4.4.She has multiple peri area wounds that are draining.   PT Comments   Pt required increased assistance for bed mobility today.  She became tearful due to painful wounds.  RN made aware & administered pain medication.  Cont to strongly recommend SNF at d/c to maximize functional recovery.    Follow Up Recommendations  SNF     Does the patient have the potential to tolerate intense rehabilitation     Barriers to Discharge        Equipment Recommendations       Recommendations for Other Services    Frequency Min 3X/week   Progress towards PT Goals Progress towards PT goals: Not progressing toward goals - comment (required increased assistance for supine>sit today)  Plan Current plan remains appropriate    Precautions / Restrictions Precautions Precautions: Fall Restrictions Weight Bearing Restrictions: No   Pertinent Vitals/Pain Painful wound areas.  RN notified for pain medication & administered oral pain medication at end of session.      Mobility  Bed Mobility Bed Mobility: Supine to Sit;Sitting - Scoot to Edge of  Bed Supine to Sit: 1: +2 Total assist;With rails;HOB elevated Supine to Sit: Patient Percentage: 20% Sitting - Scoot to Edge of Bed: 1: +2 Total assist Sitting - Scoot to Edge of Bed: Patient Percentage: 20% Details for Bed Mobility Assistance: Pt required significant (A) to lift shoulders/trunk to sitting upright, bring hips around to EOB/scoot to EOB, & to shift weight to Lt side to distribute weight through bil hips.  Pt cont's to lean heavily to Lt side & has difficulty shifting weight to achieve sitting midline.   Transfers Transfers: Sit to Stand;Stand to Sit;Stand Pivot Transfers Sit to Stand: 1: +2 Total assist;With upper extremity assist;From bed Sit to Stand: Patient Percentage: 50% Stand to Sit: 1: +2 Total assist;With upper extremity assist;With armrests;To chair/3-in-1 Stand to Sit: Patient Percentage: 50% Stand Pivot Transfers: 1: +2 Total assist Stand Pivot Transfers: Patient Percentage: 60% Details for Transfer Assistance: due to body habitus pt unable to push to standing with hands starting on bed.  Pulls herself to standing with hands on RW.   Ambulation/Gait Ambulation/Gait Assistance: Not tested (comment)      PT Goals (current goals can now be found in the care plan section) Acute Rehab PT Goals PT Goal Formulation: With patient Time For Goal Achievement: 08/02/13 Potential to Achieve Goals: Fair  Visit Information  Last PT Received On: 07/22/13 Assistance Needed: +2 History of Present Illness: Jeziah Blackbear is an 52 y.o. female with multiple medical problems including DM, morbid obesity, asthma, CHF (Epic said systolic, but EF  60 percent, so likely diastolic), CKD 3, Hypothyroidism presents to the ERWith 3 days hx of increase shortness of breath, chest tightness, and some orthopnea.  She was having some wheezing, and had seen her pulmonologist, who tx her for possible GERD.  She denied fever, chills, or coughs.  But  Admitted to have some orthopnea.  Her legs have  been swelling more, but she has no calf tenderness.  Work up in the ER included a CXR which showed cardiomegaly and borderline CHF.  Her WBC was 14K, and her Cr is 4.4.She has multiple peri area wounds that are draining.    Subjective Data      Cognition  Cognition Arousal/Alertness: Awake/alert Behavior During Therapy: WFL for tasks assessed/performed Overall Cognitive Status: Within Functional Limits for tasks assessed    Balance     End of Session PT - End of Session Activity Tolerance: Patient limited by pain;Patient limited by fatigue Patient left: in chair;with call bell/phone within reach Nurse Communication: Mobility status   GP     Sena Hitch 07/22/2013, 2:15 PM  Sarajane Marek, PTA [phone] 07/22/2013

## 2013-07-22 NOTE — Progress Notes (Signed)
[facility] PROGRESS NOTE  [name] [id number] DOB: 10/05/1961 DOA: 07/16/2013 PCP: [name], MD  HPI/Subjective: Continues to have good breathing. Continues to have good urine output but overall net I/O not very impressive. Wt decreased only 3 pounds since admission Nephrology please advise.   Assessment/Plan:  CHF, acute on chronic combined -Increased fluid overload, nephrology consulted on managing diuresis. -Continue Imdur, Bystolic and hydralazine. -LVEF is 50-55%, improved from 35% in 2011, has grade 2 diastolic dysfunction.  Acute on chronic renal failure  -With CHF, consistent with cardiorenal syndrome. -Nephrology is managing, continue diuresis because of fluid overload. -Urine output is decreasing, yesterday was 875 mL/24 hours. -Creatinine was 2.8 and 06/13/2013. -Presented with creatinine of 4.6 this time, creatinine increased to 5.6 today. -On Lasix 80 mg twice a day. Lasix held by nephrology. -Very difficult to assess fluid status, overall patient breathing is better, repeat chest x-ray.  VRE UTI -Urine culture grew VRE, patient was on vancomycin and switched to levofloxacin. -Blood culture is sterile to date. -I will asked the pharmacy to dose vancomycin as she does have renal failure.  Diabetes mellitus type 2 -Controlled diabetes mellitus type 2 with hemoglobin A1c of 5.7, this indicates mean plasma glucose of 117. -Continue insulin sliding scale.  Hidradenitis suppurativa -Multiple areas of abscesses, with purulent discharge. -Patient is on ciprofloxacin, Change to IV vancomycin, now on Levaquin -I added Hibiclens for wound cleansing. -I offered this surgical consultation for her cellulitis/hydradenitis suppurativa, patient declined.  Rectovaginal fistula -Referred to Dr. Harolyn Rutherford [facility] as outpatient.  OSA -Placed on CPAP the hospital through nasal mask, tolerated that very well. -Reported that she is not on CPAP at home.  Code  Status: Full code Family Communication: Plan discussed with patient Disposition Plan: Remains the patient.   Consultants:  [employer].  Procedures:  None  Antibiotics:  None   Objective: Filed Vitals:   07/22/13 0650  BP: 108/69  Pulse: 57  Temp: 97.4 F (36.3 C)  Resp: 23    Intake/Output Summary (Last 24 hours) at 07/22/13 0940 Last data filed at 07/22/13 0659  Gross per 24 hour  Intake   1460 ml  Output   1145 ml  Net    315 ml   Filed Weights   07/20/13 0547 07/21/13 0500 07/22/13 0650  Weight: 165.79 kg (365 lb 8 oz) 165.79 kg (365 lb 8 oz) 166.924 kg (368 lb)    Exam: General: Alert and awake, oriented x3, not in any acute distress. Morbid obesity HEENT: anicteric sclera, pupils reactive to light and accommodation, EOMI CVS: S1-S2 clear, no murmur rubs or gallops Chest: clear to auscultation bilaterally, no wheezing, rales or rhonchi Abdomen: soft nontender, nondistended, normal bowel sounds, no organomegaly Extremities: +1 to +2 pedal edema bilaterally. Neuro: Cranial nerves II-XII intact, no focal neurological deficits  Data Reviewed: Basic Metabolic Panel:  Recent Labs Lab 07/18/13 0500 07/18/13 0851 07/19/13 0500 07/20/13 0715 07/21/13 0650 07/22/13 0500  NA 142 141 141 140 142 144  K 4.2 4.3 4.1 4.0 4.2 4.3  CL 109 107 108 106 108 109  CO2 21 21 21 22 23 23   GLUCOSE 121* 116* 120* 122* 105* 105*  BUN 63* 62* 65* 68* 69* 74*  CREATININE 5.09* 5.09* 5.52* 5.60* 5.66* 5.85*  CALCIUM 8.4 8.5 8.5 9.0 9.2 8.9  MG 2.2  --  2.2 2.2 2.3 2.3  PHOS  --   --  4.8*  --  5.1* 4.8*   Liver Function Tests:  Recent  Labs Lab 07/17/13 0750 07/17/13 1534 07/18/13 0500 07/19/13 0500 07/20/13 0715 07/21/13 0650 07/22/13 0500  AST 8 8 9 7 7   --   --   ALT <5 <5 <5 <5 <5  --   --   ALKPHOS 70 70 71 76 74  --   --   BILITOT 0.3 0.3 0.3 0.3 0.3  --   --   PROT 8.5* 8.3 8.5* 8.3 9.1*  --   --   ALBUMIN 2.5* 2.5* 2.6* 2.3* 2.6*  2.4* 2.2*   No results found for this basename: LIPASE, AMYLASE,  in the last 168 hours No results found for this basename: AMMONIA,  in the last 168 hours CBC:  Recent Labs Lab 07/18/13 0500 07/19/13 0500 07/20/13 0715 07/21/13 0650 07/22/13 0500  WBC 12.4* 11.7* 13.7* 12.0* 13.6*  NEUTROABS 10.0* 9.3* 11.5* 9.8* 11.2*  HGB 10.8* 11.1* 12.0 11.6* 11.4*  HCT 37.5 37.5 40.6 40.5 38.4  MCV 93.1 94.0 92.7 93.8 92.1  PLT 207 194 218 202 213   Cardiac Enzymes:  Recent Labs Lab 07/16/13 1522 07/17/13 0020 07/17/13 0645 07/17/13 1527  TROPONINI <0.30 <0.30 <0.30 <0.30   BNP (last 3 results)  Recent Labs  08/16/12 2049 11/10/12 1724 07/16/13 1522  PROBNP 26806.0* 33908.0* 29655.0*   CBG:  Recent Labs Lab 07/21/13 0559 07/21/13 1121 07/21/13 1634 07/21/13 2208 07/22/13 0631  GLUCAP 104* 106* 136* 124* 92    Recent Results (from the past 240 hour(s))  URINE CULTURE     Status: None   Collection Time    07/17/13 12:29 PM      Result Value Range Status   Specimen Description URINE, CATHETERIZED   Final   Special Requests NONE   Final   Culture  Setup Time     Final   Value: 07/17/2013 16:43     Performed at [facility]     Final   Value: >=100,000 COLONIES/ML     Performed at [facility]   Culture     Final   Value: VANCOMYCIN RESISTANT ENTEROCOCCUS ISOLATED     Note: CRITICAL RESULT CALLED TO, READ BACK BY AND VERIFIED WITH: ALLEN T. @ 11:00 36M 8.25.14 BY DESIS     Performed at [facility]   Report Status 07/21/2013 FINAL   Final   Organism ID, Bacteria VANCOMYCIN RESISTANT ENTEROCOCCUS ISOLATED   Final  CULTURE, BLOOD (ROUTINE X 2)     Status: None   Collection Time    07/17/13  3:27 PM      Result Value Range Status   Specimen Description BLOOD LEFT HAND   Final   Special Requests BOTTLES DRAWN AEROBIC ONLY 10CC   Final   Culture  Setup Time     Final   Value: 07/17/2013 22:26     Performed at [facility]   Culture     Final   Value:        BLOOD CULTURE RECEIVED NO GROWTH TO DATE CULTURE WILL BE HELD FOR 5 DAYS BEFORE ISSUING A FINAL NEGATIVE REPORT     Performed at [facility]   Report Status PENDING   Incomplete  CULTURE, BLOOD (ROUTINE X 2)     Status: None   Collection Time    07/17/13  3:33 PM      Result Value Range Status   Specimen Description BLOOD RIGHT HAND   Final   Special Requests BOTTLES DRAWN AEROBIC ONLY  5CC   Final   Culture  Setup Time     Final   Value: 07/17/2013 22:26     Performed at [facility]   Culture     Final   Value:        BLOOD CULTURE RECEIVED NO GROWTH TO DATE CULTURE WILL BE HELD FOR 5 DAYS BEFORE ISSUING A FINAL NEGATIVE REPORT     Performed at [facility]   Report Status PENDING   Incomplete  URINE CULTURE     Status: None   Collection Time    07/18/13  8:31 AM      Result Value Range Status   Specimen Description URINE, CATHETERIZED   Final   Special Requests NONE   Final   Culture  Setup Time     Final   Value: 07/18/2013 13:49     Performed at [facility]     Final   Value: 60,000 COLONIES/ML     Performed at [facility]   Culture     Final   Value: ENTEROCOCCUS SPECIES     Note: SUSCEPTIBILITIES PERFORMED ON PREVIOUS CULTURE WITHIN THE LAST 5 DAYS.     Performed at [facility]   Report Status 07/21/2013 FINAL   Final     Studies: No results found.  Scheduled Meds: . aspirin EC  81 mg Oral q morning - 10a  . calcitRIOL  0.25 mcg Oral Daily  . calcium carbonate  1 tablet Oral BID WC  . DULoxetine  60 mg Oral Daily  . famotidine  20 mg Oral QHS  . feeding supplement  1 Container Oral Q24H  . ferrous sulfate  325 mg Oral BID  . folic acid  1 mg Oral Daily  . heparin  5,000 Units Subcutaneous Q8H  . hydrALAZINE  25 mg Oral Q8H  . insulin aspart  0-15 Units Subcutaneous TID WC  . insulin aspart  0-5 Units Subcutaneous QHS  . isosorbide mononitrate  30  mg Oral q morning - 10a  . levofloxacin  750 mg Oral Q48H  . levothyroxine  25 mcg Oral QAC breakfast  . magnesium oxide  400 mg Oral BID  . nebivolol  10 mg Oral BID  . saccharomyces boulardii  250 mg Oral BID  . senna-docusate  1 tablet Oral BID  . simvastatin  20 mg Oral q1800  . sodium bicarbonate  650 mg Oral BID  . sodium chloride  3 mL Intravenous Q12H  . zinc sulfate  220 mg Oral Daily   Continuous Infusions:   Principal Problem:   Diastolic CHF Active Problems:   Morbid obesity   GERD   DM type 2 (diabetes mellitus, type 2)   Hypertension   Renal failure (ARF), acute on chronic   SOB (shortness of breath)   OSA (obstructive sleep apnea)   UTI (urinary tract infection)    Time spent: 35 minutes    [facility] Pager [phone]. If 7PM-7AM, please contact night-coverage at [email or web address], password [facility] 07/22/2013, 9:40 AM  LOS: 6 days

## 2013-07-22 NOTE — Progress Notes (Signed)
Pt o4x, stated pain sacral area in AM when seating in Chair. Pt husband have PT bath. Pt 90% on RA, 98% on 3L. Moved Pt to 2L, will continue to monitor

## 2013-07-22 NOTE — Progress Notes (Signed)
NUTRITION FOLLOW-UP  DOCUMENTATION CODES Per approved criteria  -Morbid Obesity   INTERVENTION: Continue Resource Breeze po daily, each supplement provides 250 kcal and 9 grams of protein. Recommend more aggressive bowel regimen, pt has not had a BM x 1 week. RD to continue to follow nutrition care plan.  NUTRITION DIAGNOSIS: Increased nutrient needs related to wound healing as evidenced by estimated needs.   Goal: Intake to meet >90% of estimated nutrition needs.  Monitor:  weight trends, lab trends, I/O's, PO intake, supplement tolerance  ASSESSMENT: PMHx significant for DM, obesity, asthma, CHF, CKD III. Admitted with SOB and leg swelling x 3 days. Work-up reveals acute on chronic diastolic CHF.  WOC RN saw pt on 8/22 - work-up reveals hydradenitis - a complex skin condition that sometimes requires surgical intervention. Pt declined surgical consult.  Renal consulted - with recommendations for continued diuresis and possible HD if no improvement. Pt with cardiorenal syndrome.  Continues on Renal 60-70 diet and is eating 100% of meals. Also continues with Resource Breeze oral nutrition supplement ordered daily. Potassium WNL and phosphorus elevated, but trending down.  Height: Ht Readings from Last 1 Encounters:  07/18/13 5' 6.53" (1.69 m)    Weight: Wt Readings from Last 1 Encounters:  07/22/13 368 lb (166.924 kg)  Admit wt 371 lb - pt does not know her dry weight.  BMI:  Body mass index is 58.44 kg/(m^2). Obese Class III  Estimated Nutritional Needs: Kcal: 2000 - 2200 Protein: 80 - 95 g Fluid: 1.5 - 1.8 liters daily  Skin:  Multiple small open stage II wounds  Diet Order: Renal 60-70; 1200 ml fluid restriction  EDUCATION NEEDS: -No education needs identified at this time   Intake/Output Summary (Last 24 hours) at 07/22/13 1114 Last data filed at 07/22/13 0900  Gross per 24 hour  Intake   1700 ml  Output    795 ml  Net    905 ml    Last BM:  8/19  Labs:   Recent Labs Lab 07/19/13 0500 07/20/13 0715 07/21/13 0650 07/22/13 0500  NA 141 140 142 144  K 4.1 4.0 4.2 4.3  CL 108 106 108 109  CO2 21 22 23 23   BUN 65* 68* 69* 74*  CREATININE 5.52* 5.60* 5.66* 5.85*  CALCIUM 8.5 9.0 9.2 8.9  MG 2.2 2.2 2.3 2.3  PHOS 4.8*  --  5.1* 4.8*  GLUCOSE 120* 122* 105* 105*    CBG (last 3)   Recent Labs  07/21/13 1634 07/21/13 2208 07/22/13 0631  GLUCAP 136* 124* 92    Scheduled Meds: . aspirin EC  81 mg Oral q morning - 10a  . calcitRIOL  0.25 mcg Oral Daily  . calcium carbonate  1 tablet Oral BID WC  . DULoxetine  60 mg Oral Daily  . famotidine  20 mg Oral QHS  . feeding supplement  1 Container Oral Q24H  . ferrous sulfate  325 mg Oral BID  . folic acid  1 mg Oral Daily  . heparin  5,000 Units Subcutaneous Q8H  . hydrALAZINE  25 mg Oral Q8H  . insulin aspart  0-15 Units Subcutaneous TID WC  . insulin aspart  0-5 Units Subcutaneous QHS  . isosorbide mononitrate  30 mg Oral q morning - 10a  . levofloxacin  750 mg Oral Q48H  . levothyroxine  25 mcg Oral QAC breakfast  . magnesium oxide  400 mg Oral BID  . nebivolol  10 mg Oral BID  . saccharomyces  boulardii  250 mg Oral BID  . senna-docusate  1 tablet Oral BID  . simvastatin  20 mg Oral q1800  . sodium bicarbonate  650 mg Oral BID  . sodium chloride  3 mL Intravenous Q12H  . zinc sulfate  220 mg Oral Daily    Continuous Infusions:  none  Inda Coke MS, RD, LDN Pager: [phone] After-hours pager: [phone]

## 2013-07-22 NOTE — Progress Notes (Signed)
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  This is a difficult problem with this patient on the fence with recurrent AKI on CKD.  I think we should see if [facility] is willing to place an access; or is an AV access prohibited due to Hidradenitis .  She had vein mapping in 7/13. (prob needs another one) Will hold diuretics another day.  Subjective: Interval History: no significant change, remains nonoliguric despite holding diuretics  Objective: Vital signs in last 24 hours: Temp:  [97.4 F (36.3 C)-97.8 F (36.6 C)] 97.4 F (36.3 C) (08/26 0650) Pulse Rate:  [57-61] 60 (08/26 0958) Resp:  [18-30] 23 (08/26 0650) BP: (95-137)/(31-82) 137/82 mmHg (08/26 0958) SpO2:  [94 %-98 %] 98 % (08/26 0958) Weight:  [166.924 kg (368 lb)] 166.924 kg (368 lb) (08/26 0650) Weight change: 1.134 kg (2 lb 8 oz)  Intake/Output from previous day: 08/25 0701 - 08/26 0700 In: 1460 [P.O.:960; IV Piggyback:500] Out: 1745 [Urine:1745] Intake/Output this shift: Total I/O In: 240 [P.O.:240] Out: -   General appearance: alert, cooperative and hard of hearing Extremities: edema 2+  Lab Results:  Recent Labs  07/21/13 0650 07/22/13 0500  WBC 12.0* 13.6*  HGB 11.6* 11.4*  HCT 40.5 38.4  PLT 202 213   BMET:  Recent Labs  07/21/13 0650 07/22/13 0500  NA 142 144  K 4.2 4.3  CL 108 109  CO2 23 23  GLUCOSE 105* 105*  BUN 69* 74*  CREATININE 5.66* 5.85*  CALCIUM 9.2 8.9   No results found for this basename: PTH,  in the last 72 hours Iron Studies: No results found for this basename: IRON, TIBC, TRANSFERRIN, FERRITIN,  in the last 72 hours Studies/Results: No results found.  Scheduled: . aspirin EC  81 mg Oral q morning - 10a  . calcitRIOL  0.25 mcg Oral Daily  . calcium carbonate  1 tablet Oral BID WC  . DULoxetine  60 mg Oral Daily  .  famotidine  20 mg Oral QHS  . feeding supplement  1 Container Oral Q24H  . ferrous sulfate  325 mg Oral BID  . folic acid  1 mg Oral Daily  . heparin  5,000 Units Subcutaneous Q8H  . hydrALAZINE  25 mg Oral Q8H  . insulin aspart  0-15 Units Subcutaneous TID WC  . insulin aspart  0-5 Units Subcutaneous QHS  . isosorbide mononitrate  30 mg Oral q morning - 10a  . levofloxacin  750 mg Oral Q48H  . levothyroxine  25 mcg Oral QAC breakfast  . magnesium oxide  400 mg Oral BID  . nebivolol  10 mg Oral BID  . saccharomyces boulardii  250 mg Oral BID  . senna-docusate  1 tablet Oral BID  . simvastatin  20 mg Oral q1800  . sodium bicarbonate  650 mg Oral BID  . sodium chloride  3 mL Intravenous Q12H  . zinc sulfate  220 mg Oral Daily     LOS: 6 days   [name] C 07/22/2013,12:05 PM

## 2013-07-22 NOTE — Care Management Note (Signed)
    Page 1 of 1   07/22/2013     12:42:28 PM   CARE MANAGEMENT NOTE 07/22/2013  Patient:  [facility]   Account Number:  [id number]  Date Initiated:  07/22/2013  Documentation initiated by:  Llana Aliment  Subjective/Objective Assessment:   52yo female admitted with CHF.  Pt. lives at home with spouse, and is bedridden.     Action/Plan:   discharge planning   Anticipated DC Date:  07/24/2013   Anticipated DC Plan:  [place] referral  Clinical Social Worker      DC Planning Services  CM consult      Choice offered to / List presented to:             Status of service:  In process, will continue to follow Medicare Important Message given?   (If response is "NO", the following Medicare IM given date fields will be blank) Date Medicare IM given:   Date Additional Medicare IM given:    Discharge Disposition:    Per UR Regulation:  Reviewed for med. necessity/level of care/duration of stay  If discussed at [place] of Stay Meetings, dates discussed:   07/22/2013    Comments:  07/22/13 1240 PT rec [facility].  CSW consulted for placement. Pt. lives at home with spouse.  Will await pt. decision for disposition. Llana Aliment, RN, BSN NCM [phone]

## 2013-07-22 NOTE — Consult Note (Addendum)
[facility] Consultation  MRN : [id number] Requesting: Shelley Cruz, MD Reason: hemodialysis access [id number] access AKD on CKD  History of Present Illness: 52 y.o. Female admitted secondary to SOB, chest tightness, and orthopnea.  Extreme difficulty with ambulation. She has had several admissions for SOB/DOE and has had acute on chronic RF requiring intermittent HD. We were asked to evaluate for possible permanent access.  The pt  Is HOH and her mother was present for this evaluation.  Pt denies any paresthesias in her upper extremities.  She is right handed.  She has had no prior access procedures other than catheters.  She currently has not yet decided whether or not she wants chronic hemodialysis.  She currently is not requiring hemodialysis.  Had vein mapping 7/13.  She has had hydradenitis in the axilla before but not currently active.  Prior episodes also in groin and intertriginous area of breast.   Medical problems include DM, hypercholesterolemia,obesity, asthma, and CHF.        Current Facility-Administered Medications  Medication Dose Route Frequency Provider Last Rate Last Dose  . aspirin EC tablet 81 mg  81 mg Oral q morning - 10a Orvan Falconer, MD   81 mg at 07/22/13 1006  . calcitRIOL (ROCALTROL) capsule 0.25 mcg  0.25 mcg Oral Daily Orvan Falconer, MD   0.25 mcg at 07/22/13 1010  . calcium carbonate (OS-CAL - dosed in mg of elemental calcium) tablet 500 mg of elemental calcium  1 tablet Oral BID WC Thurnell Lose, MD   500 mg of elemental calcium at 07/22/13 0637  . chlorhexidine (HIBICLENS) 4 % liquid   Topical Daily PRN Verlee Monte, MD      . cyclobenzaprine (FLEXERIL) tablet 10 mg  10 mg Oral TID PRN Orvan Falconer, MD   10 mg at 07/19/13 0256  . DULoxetine (CYMBALTA) DR capsule 60 mg  60 mg Oral Daily Orvan Falconer, MD   60 mg at 07/22/13 1006  . famotidine (PEPCID) tablet 20 mg  20 mg Oral QHS Orvan Falconer, MD   20 mg at 07/21/13 2235  . feeding supplement  (RESOURCE BREEZE) liquid 1 Container  1 Container Oral Q24H Erlene Quan, RD   1 Container at 07/20/13 1500  . ferrous sulfate tablet 325 mg  325 mg Oral BID Orvan Falconer, MD   325 mg at 07/22/13 1006  . folic acid (FOLVITE) tablet 1 mg  1 mg Oral Daily Orvan Falconer, MD   1 mg at 07/22/13 1006  . heparin injection 5,000 Units  5,000 Units Subcutaneous Q8H Orvan Falconer, MD   5,000 Units at 07/22/13 [phone]  . hydrALAZINE (APRESOLINE) tablet 25 mg  25 mg Oral Q8H Orvan Falconer, MD   25 mg at 07/22/13 0641  . insulin aspart (novoLOG) injection 0-15 Units  0-15 Units Subcutaneous TID WC Orvan Falconer, MD   2 Units at 07/21/13 1650  . insulin aspart (novoLOG) injection 0-5 Units  0-5 Units Subcutaneous QHS Orvan Falconer, MD      . isosorbide mononitrate (IMDUR) 24 hr tablet 30 mg  30 mg Oral q morning - 10a Orvan Falconer, MD   30 mg at 07/22/13 1006  . levofloxacin (LEVAQUIN) tablet 750 mg  750 mg Oral Q48H Norva Riffle, RPH   750 mg at 07/21/13 1649  . levothyroxine (SYNTHROID, LEVOTHROID) tablet 25 mcg  25 mcg Oral QAC breakfast Orvan Falconer, MD   25 mcg at 07/22/13 [phone]  . magnesium oxide (MAG-OX) tablet  400 mg  400 mg Oral BID Orvan Falconer, MD   400 mg at 07/22/13 1006  . morphine 2 MG/ML injection 2 mg  2 mg Intravenous Q4H PRN Orvan Falconer, MD   2 mg at 07/18/13 1103  . nebivolol (BYSTOLIC) tablet 10 mg  10 mg Oral BID Orvan Falconer, MD   10 mg at 07/22/13 1006  . oxyCODONE-acetaminophen (PERCOCET/ROXICET) 5-325 MG per tablet 1-2 tablet  1-2 tablet Oral Q6H PRN Verlee Monte, MD   2 tablet at 07/22/13 0955  . saccharomyces boulardii (FLORASTOR) capsule 250 mg  250 mg Oral BID Orvan Falconer, MD   250 mg at 07/22/13 1006  . senna-docusate (Senokot-S) tablet 1 tablet  1 tablet Oral BID Orvan Falconer, MD   1 tablet at 07/22/13 1006  . simvastatin (ZOCOR) tablet 20 mg  20 mg Oral q1800 Orvan Falconer, MD   20 mg at 07/21/13 1806  . sodium bicarbonate tablet 650 mg  650 mg Oral BID Orvan Falconer, MD   650 mg at 07/22/13 1006  . sodium chloride 0.9 % injection 3 mL   3 mL Intravenous Q12H Orvan Falconer, MD   3 mL at 07/22/13 1008  . zinc sulfate capsule 220 mg  220 mg Oral Daily Orvan Falconer, MD   220 mg at 07/22/13 1006    Pt meds include: Statin :Yes Betablocker: Yes ASA: Yes Other anticoagulants/antiplatelets: none  Past Medical History  Diagnosis Date  . Systolic congestive heart failure   . SOB (shortness of breath)   . HTN (hypertension)   . Iron deficiency anemia   . Morbid obesity   . Hyperkalemia   . Hypothyroidism   . CHF (congestive heart failure)   . Hydradenitis   . Pneumonia Nov 10, 2012  . DM type 2 (diabetes mellitus, type 2)     diet controlled  . History of hemodialysis dec 2013    none currently  . Urinary tract infection     taking antibiotics for 3 days prior to surgery  . Chronic kidney disease (CKD), stage III (moderate)     [name] nephrology lov note 05-12-2013 on pt chart  . GERD (gastroesophageal reflux disease)     Past Surgical History  Procedure Laterality Date  . Portacath placement    . Cystoscopy w/ ureteral stent placement  05/28/2012    Procedure: CYSTOSCOPY WITH RETROGRADE PYELOGRAM/URETERAL STENT PLACEMENT;  Surgeon: Reece Packer, MD;  Location: [facility];  Service: Urology;  Laterality: Left;  . Cystoscopy w/ retrogrades  11/16/2012    Procedure: CYSTOSCOPY WITH RETROGRADE PYELOGRAM;  Surgeon: Reece Packer, MD;  Location: [facility];  Service: Urology;  Laterality: Bilateral;  CYSTOSCOPY,BILATERAL RETROGRADE PYELOGRAM/ REMOVAL LEFT URETERAL STENT/ FULGERATION BLADDER MUCOSA/ INSERTION RIGHT URETERAL STENT  . Port-a-cath removal    . Cystoscopy with retrograde pyelogram, ureteroscopy and stent placement Right 06/23/2013    Procedure: CYSTOSCOPY WITH RIGHT URETEROSCOPY, RIGHT  RETROGRADE PYELOGRAM, WITH LASER LIPOTRIPSY AND RIGHT URETERAL STENT EXCHANGE ;  Surgeon: Molli Hazard, MD;  Location: [facility];  Service: Urology;  Laterality: Right;  STENT EXCHANGE    . Holmium laser application N/A  [id number]    Procedure: HOLMIUM LASER APPLICATION;  Surgeon: Molli Hazard, MD;  Location: [facility];  Service: Urology;  Laterality: N/A;    Social History History  Substance Use Topics  . Smoking status: Former Smoker -- 0.25 packs/day for 1 years    Types: Cigarettes    Quit date: 11/28/1979  . Smokeless tobacco:  Never Used  . Alcohol Use: No    Family History Family History  Problem Relation Age of Onset  . Coronary artery disease Mother   . Hypertension Mother   . Diabetes type II Mother   . Malignant hyperthermia Mother   . Coronary artery disease Father   . Hypertension Father   . Malignant hyperthermia Father   . Cancer Maternal Grandfather     ? Type  . Kidney failure Maternal Grandmother     Allergies  Allergen Reactions  . Amoxicillin-Pot Clavulanate Diarrhea  . Rosiglitazone Maleate     REACTION: swelling  . Amoxicillin Rash    Tolerated Zosyn 01/2013.  [name]     REVIEW OF SYSTEMS  General: [ ]  Weight loss, [ ]  Fever, [ ]  chills Neurologic: [ ]  Dizziness, [ ]  Blackouts, [ ]  Seizure [ ]  Stroke, [ ]  "Mini stroke", [ ]  Slurred speech, [ ]  Temporary blindness; [ ]  weakness in arms or legs, [ ]  Hoarseness [ ]  Dysphagia Cardiac: [ ]  Chest pain/pressure, [x ] Shortness of breath at rest [ x] Shortness of breath with exertion, [ ]  Atrial fibrillation or irregular heartbeat  Vascular: [ ]  Pain in legs with walking, [ ]  Pain in legs at rest, [ ]  Pain in legs at night,  [ ]  Non-healing ulcer, [ ]  Blood clot in vein/DVT,   Pulmonary: [ ]  Home oxygen, [ ]  Productive cough, [ ]  Coughing up blood, [ ]  Asthma, [x]  oxygen in the hospital  [ ]  Wheezing [x ] COPD Musculoskeletal:  [ ]  Arthritis, [ ]  Low back pain, [ ]  Joint pain Hematologic: [ ]  Easy Bruising, [ ]  Anemia; [ ]  Hepatitis Gastrointestinal: [ ]  Blood in stool, [ ]  Gastroesophageal Reflux/heartburn, Urinary: [x]  chronic Kidney disease, [ ]  on HD - [ ]  MWF or [ ]  TTHS, [ ]  Burning with urination, [ ]   Difficulty urinating Skin: [ ]  Rashes, [ ]  Wounds Psychological: [ ]  Anxiety, [ ]  Depression  Physical Examination Filed Vitals:   07/21/13 2214 07/22/13 0641 07/22/13 0650 07/22/13 0958  BP: 116/74 112/68 108/69 137/82  Pulse: 58  57 60  Temp: 97.5 F (36.4 C)  97.4 F (36.3 C)   TempSrc: Oral  Oral   Resp: 22  23   Height:      Weight:   368 lb (166.924 kg)   SpO2: 94%  98% 98%   Body mass index is 58.44 kg/(m^2).  General:  WDWN in NAD HENT: WNL Eyes: Pupils equal Pulmonary: normal non-labored breathing  Cardiac: RRR,   Abdomen: soft,obese, NT,  Skin: no rashes, ulcers noted;  no Gangrene , no cellulitis; no open wounds;   Vascular Exam/Pulses: 3+ radial/ulnar/DP bilaterally Musculoskeletal: no muscle wasting or atrophy; no edema  Neurologic: A&O X 3; Appropriate Affect ;  SENSATION: normal;unable to hear MOTOR FUNCTION: 5/5 Symmetric Speech is fluent/normal   Significant Diagnostic Studies: CBC Lab Results  Component Value Date   WBC 13.6* 07/22/2013   HGB 11.4* 07/22/2013   HCT 38.4 07/22/2013   MCV 92.1 07/22/2013   PLT 213 07/22/2013    BMET    Component Value Date/Time   NA 144 07/22/2013 0500   K 4.3 07/22/2013 0500   CL 109 07/22/2013 0500   CO2 23 07/22/2013 0500   GLUCOSE 105* 07/22/2013 0500   BUN 74* 07/22/2013 0500   CREATININE 5.85* 07/22/2013 0500   CREATININE 1.90* 03/07/2012 1652   CALCIUM 8.9 07/22/2013 0500   GFRNONAA 8* 07/22/2013 0500  GFRAA 9* 07/22/2013 0500   Estimated Creatinine Clearance: 18.5 ml/min (by C-G formula based on Cr of 5.85).  COAG Lab Results  Component Value Date   INR 1.37 02/14/2013   INR 1.23 11/16/2012   INR 1.32 11/15/2012     Non-Invasive Vascular Imaging: 05/2012 Vein mapping shows adequate Cephalic vein bilat.  ASSESSMENT/PLAN: Dayanira Brengle is a 52 y.o. female with acute on chronic renal failure requiring intermittent hemodialysis.  Pt may need more permanent access. May need to repeat vein  mapping   Laurence Slate [facility] 07/22/2013 1:13 PM  History and exam details as above.  I spoke with the patient and her mother.  Currently patient has not decided if she wants to proceed with dialysis.  Fistula would be best option in light of pts prior skin infections.  Although she is obese the upper extremities are fairly thin.  Vein map from 14 mo ago suggest upper arm cephalic vein would be usable but has had multiple hospital admissions and venipuncture since then so needs repeat mapping.  Will order today so that we have plan in place if pt wishes to proceed.  Please call us if she wishes to proceed during this hospital admission.  Ruta Hinds, MD [facility] [place] Office: [phone] Pager: [phone]   Addendum: Vein map reviewed 1.8 mm cephalic vein on left basilic small as well.  Will most likely need AV graft.   Ruta Hinds, MD [facility] [place] Office: [phone] Pager: [phone]

## 2013-07-23 DIAGNOSIS — N19 Unspecified kidney failure: Secondary | ICD-10-CM

## 2013-07-23 LAB — CULTURE, BLOOD (ROUTINE X 2): Culture: NO GROWTH

## 2013-07-23 LAB — CBC WITH DIFFERENTIAL/PLATELET
HCT: 39.1 % (ref 36.0–46.0)
Hemoglobin: 11.3 g/dL — ABNORMAL LOW (ref 12.0–15.0)
Lymphocytes Relative: 7 % — ABNORMAL LOW (ref 12–46)
Monocytes Absolute: 0.9 10*3/uL (ref 0.1–1.0)
Monocytes Relative: 7 % (ref 3–12)
Neutro Abs: 10.1 10*3/uL — ABNORMAL HIGH (ref 1.7–7.7)
WBC: 12.7 10*3/uL — ABNORMAL HIGH (ref 4.0–10.5)

## 2013-07-23 LAB — RENAL FUNCTION PANEL
CO2: 23 mEq/L (ref 19–32)
Chloride: 106 mEq/L (ref 96–112)
GFR calc Af Amer: 9 mL/min — ABNORMAL LOW (ref >90)
Glucose, Bld: 95 mg/dL (ref 70–99)
Potassium: 4.2 mEq/L (ref 3.5–5.1)
Sodium: 141 mEq/L (ref 135–145)

## 2013-07-23 LAB — GLUCOSE, CAPILLARY
Glucose-Capillary: 95 mg/dL (ref 70–99)
Glucose-Capillary: 136 mg/dL — ABNORMAL HIGH (ref 70–99)

## 2013-07-23 LAB — MAGNESIUM: Magnesium: 2.4 mg/dL (ref 1.5–2.5)

## 2013-07-23 MED ORDER — LEVOFLOXACIN 500 MG PO TABS
500.0000 mg | ORAL_TABLET | ORAL | Status: DC
  Administered 2013-07-23 – 2013-07-29 (×4): 500 mg via ORAL
  Filled 2013-07-23 (×5): qty 1

## 2013-07-23 MED ORDER — FUROSEMIDE 80 MG PO TABS
80.0000 mg | ORAL_TABLET | Freq: Two times a day (BID) | ORAL | Status: DC
  Administered 2013-07-23 – 2013-07-26 (×6): 80 mg via ORAL
  Filled 2013-07-23 (×8): qty 1

## 2013-07-23 NOTE — Consult Note (Signed)
[name] to see pt today to see if she wishes to proceed with access.  Tentatively would be Friday.  Vein is not really suitable per vein map yesterday.  Cephalic 1.8 mm.  Will await word from [name] on whether to proceed.  Ruta Hinds, MD [facility] [place] Office: [phone] Pager: [phone]

## 2013-07-23 NOTE — Progress Notes (Signed)
Patient refused to wear cpap tonight.

## 2013-07-23 NOTE — Progress Notes (Signed)
Pt 04x no complaints of CP or sob. CHarge RN n NT gave Pt bath. Charge Rn remove foam dressing to drainage, did not replace at this time.

## 2013-07-23 NOTE — Progress Notes (Addendum)
VASCULAR LAB PRELIMINARY  PRELIMINARY  PRELIMINARY  PRELIMINARY  Right  Upper Extremity Vein Map    Cephalic  Segment Diameter Depth Comment  1. Axilla 2.5 mm mm   2. Mid upper arm 2.1 mm mm   3. Above AC 1.6 mm mm   4. In [facility] 3 mm mm branch  5. Below AC 1.5 mm mm   6. Mid forearm 1 mm mm   7. Above Wrist 1.3 mm mm    mm mm    mm mm    mm mm    Basilic  Segment Diameter Depth Comment  1. Above AC 2.4 mm 1.9 mm   2. In AC 1.7 mm 1.5 mm    mm mm    mm mm    mm mm    mm mm    mm mm    mm mm    mm mm    mm mm    VASCULAR LAB PRELIMINARY  PRELIMINARY  PRELIMINARY  PRELIMINARY  Left Upper Extremity Vein Map    Cephalic  Segment Diameter Depth Comment  1. Axilla mm mm Not visualized  2. Mid upper arm mm mm Not visualized  3. Above AC 1.8 mm mm   4. In AC 1.5 mm mm branch  5. Below AC 1.7 mm mm   6. Mid forearm 1.3 mm mm   7. Wrist 1.8 mm mm    mm mm    mm mm    mm mm    Basilic  Segment Diameter Depth Comment  1. Above AC mm mm Not visualized  2. In AC mm mm Not visualized   mm mm    mm mm    mm mm    mm mm    mm mm    mm mm    mm mm    mm mm      Breely Panik, RVT 07/23/2013, 1:36 PM

## 2013-07-23 NOTE — Progress Notes (Signed)
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  Restart Furosemide for now PO; [facility] to schedule AV access placement   Subjective: Interval History: Agreeable to AV access placement  Objective: Vital signs in last 24 hours: Temp:  [97.1 F (36.2 C)-97.8 F (36.6 C)] 97.8 F (36.6 C) (08/27 0641) Pulse Rate:  [57-62] 57 (08/27 1100) Resp:  [20-23] 20 (08/27 1100) BP: (122-132)/(75-87) 122/87 mmHg (08/27 1100) SpO2:  [90 %-98 %] 95 % (08/27 1100) Weight change:   Intake/Output from previous day: 08/26 0701 - 08/27 0700 In: 1080 [P.O.:1080] Out: 1100 [Urine:1100] Intake/Output this shift: Total I/O In: 240 [P.O.:240] Out: -   General appearance: alert, cooperative and HOH GI: soft, non-tender; bowel sounds normal; no masses,  no organomegaly and protuberant Extremities: edema 2+ edema  Lab Results:  Recent Labs  07/22/13 0500 07/23/13 0708  WBC 13.6* 12.7*  HGB 11.4* 11.3*  HCT 38.4 39.1  PLT 213 193   BMET:  Recent Labs  07/22/13 0500 07/23/13 0708  NA 144 141  K 4.3 4.2  CL 109 106  CO2 23 23  GLUCOSE 105* 95  BUN 74* 75*  CREATININE 5.85* 5.82*  CALCIUM 8.9 8.9   No results found for this basename: PTH,  in the last 72 hours Iron Studies: No results found for this basename: IRON, TIBC, TRANSFERRIN, FERRITIN,  in the last 72 hours Studies/Results: No results found.  Scheduled: . aspirin EC  81 mg Oral q morning - 10a  . calcitRIOL  0.25 mcg Oral Daily  . calcium carbonate  1 tablet Oral BID WC  . DULoxetine  60 mg Oral Daily  . famotidine  20 mg Oral QHS  . feeding supplement  1 Container Oral Q24H  . ferrous sulfate  325 mg Oral BID  . folic acid  1 mg Oral Daily  . heparin  5,000 Units Subcutaneous Q8H  . hydrALAZINE  25 mg Oral Q8H  . insulin aspart  0-15 Units Subcutaneous TID WC  .  insulin aspart  0-5 Units Subcutaneous QHS  . isosorbide mononitrate  30 mg Oral q morning - 10a  . levofloxacin  500 mg Oral Q48H  . levothyroxine  25 mcg Oral QAC breakfast  . magnesium oxide  400 mg Oral BID  . nebivolol  10 mg Oral BID  . saccharomyces boulardii  250 mg Oral BID  . senna-docusate  1 tablet Oral BID  . simvastatin  20 mg Oral q1800  . sodium bicarbonate  650 mg Oral BID  . sodium chloride  3 mL Intravenous Q12H  . zinc sulfate  220 mg Oral Daily     LOS: 7 days   [name] C 07/23/2013,1:29 PM

## 2013-07-23 NOTE — Progress Notes (Signed)
[facility] PROGRESS NOTE  [name] [id number] DOB: 08/25/1961 DOA: 07/16/2013 PCP: [name], MD  HPI/Subjective:  Patient reporting significant improvement through her respirations since admission. She is tolerating by mouth intake, remains hemodynamically stable. She is asking when she can go home. She has been out of bed to chair, is working with physical therapy. She has had a deterioration of her kidney function since July, with a creatinine of 5.6 range now. Nephrology is following we discussed about the possibility of hemodialysis as a possible future intervention. She states wishing to speak to her family members regarding this decision.   Assessment/Plan:  CHF, acute on chronic combined -Increased fluid overload, nephrology consulted on managing diuresis. Presently he appears compensated. -Continue Imdur, Bystolic and hydralazine. -Status post transthoracic echocardiogram performed on 07/17/2013 with LVEF of 50-55%, improved from 35% in 2011, has grade 2 diastolic dysfunction.  Acute on chronic renal failure  -Patient with a history of chronic kidney disease having a creatinine of 2.85, BUN of 40 on the seventh 18 2014. On not 06/30/2013 her creatinine increased to 5.01. During this hospitalization her creatinine has been gradually trending up to 5.85 and BUN is 74 on 07/22/2013. Nephrology has been following. Lasix, previously 80 mg twice daily had old due to worsening kidney function. Dialysis as a likely future intervention. Patient having vein mapping in July of 2013. Vascular surgery was consulted fistula would be best option given history. She is still undecided whether to proceed, she will like to speak to family members regarding hemodialysis.   VRE UTI -Urine culture grew VRE, patient was on vancomycin and switched to levofloxacin. -Blood culture is sterile to date, patient remained afebrile, hemodynamically stable. -I will asked the pharmacy to dose  vancomycin as she does have renal failure.  Diabetes mellitus type 2 -Controlled diabetes mellitus type 2 with hemoglobin A1c of 5.7, this indicates mean plasma glucose of 117. -Continue insulin sliding scale.  Hidradenitis suppurativa -Multiple areas of abscesses, with purulent discharge. -Patient is on ciprofloxacin, Change to IV vancomycin. Continue Levaquin. -I added Hibiclens for wound cleansing. -Patient was offered surgical consultation for her cellulitis/hydradenitis suppurativa, patient declined.  Rectovaginal fistula -Referred to Dr. Harolyn Rutherford [facility] as outpatient.  OSA -Placed on CPAP the hospital through nasal mask, tolerated that very well. -Reported that she is not on CPAP at home.  Code Status: Full code Family Communication: Plan discussed with patient Disposition Plan: Remains the patient.   Consultants:  [employer].  Procedures:  None  Antibiotics:  None   Objective: Filed Vitals:   07/23/13 0641  BP: 132/82  Pulse: 58  Temp: 97.8 F (36.6 C)  Resp: 23    Intake/Output Summary (Last 24 hours) at 07/23/13 0821 Last data filed at 07/23/13 0646  Gross per 24 hour  Intake   1080 ml  Output   1100 ml  Net    -20 ml   Filed Weights   07/20/13 0547 07/21/13 0500 07/22/13 0650  Weight: 165.79 kg (365 lb 8 oz) 165.79 kg (365 lb 8 oz) 166.924 kg (368 lb)    Exam: General: Alert and awake, oriented x3, not in any acute distress. Morbid obesity HEENT: anicteric sclera, pupils reactive to light and accommodation, EOMI CVS: S1-S2 clear, no murmur rubs or gallops Chest: clear to auscultation bilaterally, no wheezing, rales or rhonchi Abdomen: soft nontender, nondistended, normal bowel sounds, no organomegaly Extremities: +1 to +2 pedal edema bilaterally. Neuro: Cranial nerves II-XII intact, no focal neurological deficits Skin: Right axillary  swelling noted no purulent discharge. There is right inguinal swelling erythema pain to  palpation.  Data Reviewed: Basic Metabolic Panel:  Recent Labs Lab 07/18/13 0500 07/18/13 0851 07/19/13 0500 07/20/13 0715 07/21/13 0650 07/22/13 0500  NA 142 141 141 140 142 144  K 4.2 4.3 4.1 4.0 4.2 4.3  CL 109 107 108 106 108 109  CO2 21 21 21 22 23 23   GLUCOSE 121* 116* 120* 122* 105* 105*  BUN 63* 62* 65* 68* 69* 74*  CREATININE 5.09* 5.09* 5.52* 5.60* 5.66* 5.85*  CALCIUM 8.4 8.5 8.5 9.0 9.2 8.9  MG 2.2  --  2.2 2.2 2.3 2.3  PHOS  --   --  4.8*  --  5.1* 4.8*   Liver Function Tests:  Recent Labs Lab 07/17/13 0750 07/17/13 1534 07/18/13 0500 07/19/13 0500 07/20/13 0715 07/21/13 0650 07/22/13 0500  AST 8 8 9 7 7   --   --   ALT <5 <5 <5 <5 <5  --   --   ALKPHOS 70 70 71 76 74  --   --   BILITOT 0.3 0.3 0.3 0.3 0.3  --   --   PROT 8.5* 8.3 8.5* 8.3 9.1*  --   --   ALBUMIN 2.5* 2.5* 2.6* 2.3* 2.6* 2.4* 2.2*   No results found for this basename: LIPASE, AMYLASE,  in the last 168 hours No results found for this basename: AMMONIA,  in the last 168 hours CBC:  Recent Labs Lab 07/18/13 0500 07/19/13 0500 07/20/13 0715 07/21/13 0650 07/22/13 0500  WBC 12.4* 11.7* 13.7* 12.0* 13.6*  NEUTROABS 10.0* 9.3* 11.5* 9.8* 11.2*  HGB 10.8* 11.1* 12.0 11.6* 11.4*  HCT 37.5 37.5 40.6 40.5 38.4  MCV 93.1 94.0 92.7 93.8 92.1  PLT 207 194 218 202 213   Cardiac Enzymes:  Recent Labs Lab 07/16/13 1522 07/17/13 0020 07/17/13 0645 07/17/13 1527  TROPONINI <0.30 <0.30 <0.30 <0.30   BNP (last 3 results)  Recent Labs  08/16/12 2049 11/10/12 1724 07/16/13 1522  PROBNP 26806.0* 33908.0* 29655.0*   CBG:  Recent Labs Lab 07/22/13 0631 07/22/13 1112 07/22/13 1544 07/22/13 2145 07/23/13 0642  GLUCAP 92 112* 119* 122* 95    Recent Results (from the past 240 hour(s))  URINE CULTURE     Status: None   Collection Time    07/17/13 12:29 PM      Result Value Range Status   Specimen Description URINE, CATHETERIZED   Final   Special Requests NONE   Final    Culture  Setup Time     Final   Value: 07/17/2013 16:43     Performed at [place]     Final   Value: >=100,000 COLONIES/ML     Performed at [facility]   Culture     Final   Value: VANCOMYCIN RESISTANT ENTEROCOCCUS ISOLATED     Note: CRITICAL RESULT CALLED TO, READ BACK BY AND VERIFIED WITH: ALLEN T. @ 11:00 29M 8.25.14 BY DESIS     Performed at [facility]   Report Status 07/21/2013 FINAL   Final   Organism ID, Bacteria VANCOMYCIN RESISTANT ENTEROCOCCUS ISOLATED   Final  CULTURE, BLOOD (ROUTINE X 2)     Status: None   Collection Time    07/17/13  3:27 PM      Result Value Range Status   Specimen Description BLOOD LEFT HAND   Final   Special Requests BOTTLES DRAWN AEROBIC ONLY 10CC  Final   Culture  Setup Time     Final   Value: 07/17/2013 22:26     Performed at [facility]   Culture     Final   Value:        BLOOD CULTURE RECEIVED NO GROWTH TO DATE CULTURE WILL BE HELD FOR 5 DAYS BEFORE ISSUING A FINAL NEGATIVE REPORT     Performed at [facility]   Report Status PENDING   Incomplete  CULTURE, BLOOD (ROUTINE X 2)     Status: None   Collection Time    07/17/13  3:33 PM      Result Value Range Status   Specimen Description BLOOD RIGHT HAND   Final   Special Requests BOTTLES DRAWN AEROBIC ONLY 5CC   Final   Culture  Setup Time     Final   Value: 07/17/2013 22:26     Performed at [facility]   Culture     Final   Value:        BLOOD CULTURE RECEIVED NO GROWTH TO DATE CULTURE WILL BE HELD FOR 5 DAYS BEFORE ISSUING A FINAL NEGATIVE REPORT     Performed at [facility]   Report Status PENDING   Incomplete  URINE CULTURE     Status: None   Collection Time    07/18/13  8:31 AM      Result Value Range Status   Specimen Description URINE, CATHETERIZED   Final   Special Requests NONE   Final   Culture  Setup Time     Final   Value: 07/18/2013 13:49     Performed at [facility]      Final   Value: 60,000 COLONIES/ML     Performed at [facility]   Culture     Final   Value: ENTEROCOCCUS SPECIES     Note: SUSCEPTIBILITIES PERFORMED ON PREVIOUS CULTURE WITHIN THE LAST 5 DAYS.     Performed at [facility]   Report Status 07/21/2013 FINAL   Final     Studies: No results found.  Scheduled Meds: . aspirin EC  81 mg Oral q morning - 10a  . calcitRIOL  0.25 mcg Oral Daily  . calcium carbonate  1 tablet Oral BID WC  . DULoxetine  60 mg Oral Daily  . famotidine  20 mg Oral QHS  . feeding supplement  1 Container Oral Q24H  . ferrous sulfate  325 mg Oral BID  . folic acid  1 mg Oral Daily  . heparin  5,000 Units Subcutaneous Q8H  . hydrALAZINE  25 mg Oral Q8H  . insulin aspart  0-15 Units Subcutaneous TID WC  . insulin aspart  0-5 Units Subcutaneous QHS  . isosorbide mononitrate  30 mg Oral q morning - 10a  . levofloxacin  500 mg Oral Q48H  . levothyroxine  25 mcg Oral QAC breakfast  . magnesium oxide  400 mg Oral BID  . nebivolol  10 mg Oral BID  . saccharomyces boulardii  250 mg Oral BID  . senna-docusate  1 tablet Oral BID  . simvastatin  20 mg Oral q1800  . sodium bicarbonate  650 mg Oral BID  . sodium chloride  3 mL Intravenous Q12H  . zinc sulfate  220 mg Oral Daily   Continuous Infusions:   Principal Problem:   Diastolic CHF Active Problems:   Morbid obesity   GERD   DM type 2 (diabetes mellitus,  type 2)   Hypertension   Renal failure (ARF), acute on chronic   SOB (shortness of breath)   OSA (obstructive sleep apnea)   UTI (urinary tract infection)    Time spent: 35 minutes    Kelvin Cellar  [facility] Pager [phone]. If 7PM-7AM, please contact night-coverage at [email or web address], password [facility] 07/23/2013, 8:21 AM  LOS: 7 days

## 2013-07-23 NOTE — Progress Notes (Signed)
ANTIBIOTIC CONSULT NOTE - FOLLOW UP  Pharmacy Consult for Levaquin Indication: VRE in Urine, Hidradenitis suppurativa   Allergies  Allergen Reactions  . Amoxicillin-Pot Clavulanate Diarrhea  . Rosiglitazone Maleate     REACTION: swelling  . Amoxicillin Rash    Tolerated Zosyn 01/2013.  [name]    Patient Measurements: Height: 5' 6.53" (169 cm) Weight:  (Pt refused to be weighted in the lift; RN Aware) IBW/kg (Calculated) : 60.53  Vital Signs: Temp: 97.8 F (36.6 C) (08/27 0641) Temp src: Oral (08/27 0641) BP: 132/82 mmHg (08/27 0641) Pulse Rate: 58 (08/27 0641) Intake/Output from previous day: 08/26 0701 - 08/27 0700 In: 1080 [P.O.:1080] Out: 1100 [Urine:1100] Intake/Output from this shift: Total I/O In: 240 [P.O.:240] Out: -   Labs:  Recent Labs  07/21/13 0650 07/22/13 0500 07/23/13 0708  WBC 12.0* 13.6* 12.7*  HGB 11.6* 11.4* 11.3*  PLT 202 213 193  CREATININE 5.66* 5.85* 5.82*   Estimated Creatinine Clearance: 18.6 ml/min (by C-G formula based on Cr of 5.82). No results found for this basename: VANCOTROUGH, Corlis Leak, VANCORANDOM, [place], [place], [place], [place], [place], TOBRARND, AMIKACINPEAK, AMIKACINTROU, AMIKACIN,  in the last 72 hours   Microbiology: Recent Results (from the past 720 hour(s))  URINE CULTURE     Status: None   Collection Time    07/17/13 12:29 PM      Result Value Range Status   Specimen Description URINE, CATHETERIZED   Final   Special Requests NONE   Final   Culture  Setup Time     Final   Value: 07/17/2013 16:43     Performed at [place]     Final   Value: >=100,000 COLONIES/ML     Performed at [facility]   Culture     Final   Value: VANCOMYCIN RESISTANT ENTEROCOCCUS ISOLATED     Note: CRITICAL RESULT CALLED TO, READ BACK BY AND VERIFIED WITH: ALLEN T. @ 11:00 56M 8.25.14 BY DESIS     Performed at [facility]   Report Status 07/21/2013 FINAL   Final   Organism ID,  Bacteria VANCOMYCIN RESISTANT ENTEROCOCCUS ISOLATED   Final  CULTURE, BLOOD (ROUTINE X 2)     Status: None   Collection Time    07/17/13  3:27 PM      Result Value Range Status   Specimen Description BLOOD LEFT HAND   Final   Special Requests BOTTLES DRAWN AEROBIC ONLY 10CC   Final   Culture  Setup Time     Final   Value: 07/17/2013 22:26     Performed at [facility]   Culture     Final   Value: NO GROWTH 5 DAYS     Performed at [facility]   Report Status 07/23/2013 FINAL   Final  CULTURE, BLOOD (ROUTINE X 2)     Status: None   Collection Time    07/17/13  3:33 PM      Result Value Range Status   Specimen Description BLOOD RIGHT HAND   Final   Special Requests BOTTLES DRAWN AEROBIC ONLY 5CC   Final   Culture  Setup Time     Final   Value: 07/17/2013 22:26     Performed at [facility]   Culture     Final   Value: NO GROWTH 5 DAYS     Performed at [facility]   Report Status 07/23/2013 FINAL   Final  URINE CULTURE  Status: None   Collection Time    07/18/13  8:31 AM      Result Value Range Status   Specimen Description URINE, CATHETERIZED   Final   Special Requests NONE   Final   Culture  Setup Time     Final   Value: 07/18/2013 13:49     Performed at [place]     Final   Value: 60,000 COLONIES/ML     Performed at [facility]   Culture     Final   Value: ENTEROCOCCUS SPECIES     Note: SUSCEPTIBILITIES PERFORMED ON PREVIOUS CULTURE WITHIN THE LAST 5 DAYS.     Performed at [facility]   Report Status 07/21/2013 FINAL   Final    Anti-infectives   Start     Dose/Rate Route Frequency Ordered Stop   07/23/13 1600  levofloxacin (LEVAQUIN) tablet 500 mg     500 mg Oral Every 48 hours 07/23/13 0820     07/21/13 1600  levofloxacin (LEVAQUIN) tablet 750 mg  Status:  Discontinued     750 mg Oral Every 48 hours 07/21/13 1416 07/23/13 0820   07/19/13 1200  vancomycin (VANCOCIN) 1,500 mg in sodium  chloride 0.9 % 500 mL IVPB  Status:  Discontinued     1,500 mg 250 mL/hr over 120 Minutes Intravenous Every 48 hours 07/19/13 1122 07/21/13 1410   07/18/13 1000  ciprofloxacin (CIPRO) IVPB 400 mg  Status:  Discontinued     400 mg 200 mL/hr over 60 Minutes Intravenous Every 24 hours 07/18/13 0837 07/19/13 1055      Assessment: 52 year old female on day 3 Levaquin for a VRE UTI and hidradenitis suppurativa. She has acute on chronic renal failure with a SCr 5.82. HD is being considered as a potential option but decision has not been made yet.  Cipro 8/22>> 8/23 Vanc 8/23>>8/25 Levaquin 8/25>>  8/22 UCx - reinc 8/21 BCx2 - NGTD 8/21 UCx - >100K VRE (S-Levaquin)  Plan:  Change Levaquin to 500 mg PO q48h Will monitor renal function and adjust as needed  [facility], Pharm.D., BCPS Clinical Pharmacist Pager: [phone] 07/23/2013 10:15 AM

## 2013-07-23 NOTE — Clinical Social Work Psychosocial (Signed)
     Clinical Social Work Department BRIEF PSYCHOSOCIAL ASSESSMENT 07/23/2013  Patient:  [name], [name]     Account Number:  [id number]     Admit date:  07/16/2013  Clinical Social Worker:  Iona Coach  Date/Time:  07/23/2013 02:45 PM  Referred by:  Physician  Date Referred:  07/22/2013 Referred for  SNF Placement   Other Referral:   Interview type:  Other - See comment Other interview type:   Patient and husband Legrand Como    PSYCHOSOCIAL DATA Living Status:  HUSBAND Admitted from facility:   Level of care:   Primary support name:  Marisol Banken  [id number] Primary support relationship to patient:  SPOUSE Degree of support available:   Strong support    *mother is also supportive -- Georgina Peer    CURRENT CONCERNS Current Concerns  Other - See comment   Other Concerns:   Probable new dialysis patient    SOCIAL WORK ASSESSMENT / PLAN CSW was referred to talk to this patient aout shrot term SNF as recommended by Physical Therapy. CSW had attempted to speak with patient yesterday but she stated that she could not hear me aas her hearing aide batteries were dead and her husband had to bring more to the hospital. CSW met today with patient and her husband Legrand Como.  Discussed PT's recommendation for short term SNF- patient and husband adamantly refuse this option.  Husband states the feels he can manage her care at home with the help of her mother. Patient is very possibly going to need dialysis and is currently undergoing vein mapping. Patient and husband state understanding of dialysis process as other family members have required dialysis. Husband states he woudl transport her via car. Patient is bariatric and has current skin breakdown issues.  She states they will accept home health and DME. Notified RNCM of above..   Assessment/plan status:  No Further Intervention Required Other assessment/ plan:   Patient still does not having working hearing aides but was able to  communicate today when CSW spoke loudly in her ear. Patient and husband had insurance questions as she is currently covered by his [employer].  Discussed Medicare and Medicaid; husband states that she started receiving her first disability check this month. Anticipate need for Medicaid application should she have to go on diaysis. Discussed with patient and husband to contact both the [place] to initate a [employer] appliation.   Information/referral to community resources:   Medicare and medicaid information relayed to patient and her husband.    PATIENTS/FAMILYS RESPONSE TO PLAN OF CARE: Patient is alert and oriented; very pleasant lady who is extremly hard of hearing and currently without her hearing aides. Her husband stated that he has changed her batteries but they are still not working; he plans to take them in to be checked and repaired with the dealer.  Patient and husband refused SNF care but agree to any home care services that can be offered. CSW discussed wtih RNCM and will sign off; will be available in future as needed. Lorie Phenix. [place], [place]

## 2013-07-24 LAB — GLUCOSE, CAPILLARY
Glucose-Capillary: 104 mg/dL — ABNORMAL HIGH (ref 70–99)
Glucose-Capillary: 122 mg/dL — ABNORMAL HIGH (ref 70–99)
Glucose-Capillary: 109 mg/dL — ABNORMAL HIGH (ref 70–99)
Glucose-Capillary: 128 mg/dL — ABNORMAL HIGH (ref 70–99)

## 2013-07-24 LAB — CBC WITH DIFFERENTIAL/PLATELET
Basophils Relative: 0 % (ref 0–1)
Eosinophils Absolute: 0.9 10*3/uL — ABNORMAL HIGH (ref 0.0–0.7)
Hemoglobin: 10.6 g/dL — ABNORMAL LOW (ref 12.0–15.0)
MCH: 27 pg (ref 26.0–34.0)
MCHC: 29 g/dL — ABNORMAL LOW (ref 30.0–36.0)
Monocytes Relative: 5 % (ref 3–12)
Neutrophils Relative %: 81 % — ABNORMAL HIGH (ref 43–77)
Platelets: 217 10*3/uL (ref 150–400)

## 2013-07-24 LAB — RENAL FUNCTION PANEL
Calcium: 9 mg/dL (ref 8.4–10.5)
Creatinine, Ser: 6.01 mg/dL — ABNORMAL HIGH (ref 0.50–1.10)
GFR calc Af Amer: 8 mL/min — ABNORMAL LOW (ref >90)
Glucose, Bld: 108 mg/dL — ABNORMAL HIGH (ref 70–99)
Phosphorus: 4.9 mg/dL — ABNORMAL HIGH (ref 2.3–4.6)
Sodium: 142 mEq/L (ref 135–145)

## 2013-07-24 MED ORDER — VANCOMYCIN HCL IN DEXTROSE 1-5 GM/200ML-% IV SOLN
1000.0000 mg | INTRAVENOUS | Status: AC
  Administered 2013-07-25: 1000 mg via INTRAVENOUS
  Filled 2013-07-24: qty 200

## 2013-07-24 NOTE — Anesthesia Preprocedure Evaluation (Addendum)
Anesthesia Evaluation  Patient identified by MRN, date of birth, ID band Patient awake    Reviewed: Allergy & Precautions, H&P , NPO status , Patient's Chart, lab work & pertinent test results, reviewed documented beta blocker date and time   Airway Mallampati: III TM Distance: >3 FB Neck ROM: Full    Dental  (+) Teeth Intact and Dental Advisory Given   Pulmonary shortness of breath and with exertion, sleep apnea ,    Pulmonary exam normal       Cardiovascular hypertension, Pt. on medications and Pt. on home beta blockers +CHF     Neuro/Psych PSYCHIATRIC DISORDERS    GI/Hepatic Neg liver ROS, GERD-  ,  Endo/Other  diabetes, Type 2, Insulin DependentHypothyroidism Morbid obesity  Renal/GU ESRF and DialysisRenal disease     Musculoskeletal   Abdominal   Peds  Hematology   Anesthesia Other Findings   Reproductive/Obstetrics                        Anesthesia Physical Anesthesia Plan  ASA: III  Anesthesia Plan: General   Post-op Pain Management:    Induction: Intravenous  Airway Management Planned: Oral ETT  Additional Equipment:   Intra-op Plan:   Post-operative Plan: Extubation in OR  Informed Consent: I have reviewed the patients History and Physical, chart, labs and discussed the procedure including the risks, benefits and alternatives for the proposed anesthesia with the patient or authorized representative who has indicated his/her understanding and acceptance.   Dental advisory given  Plan Discussed with: CRNA, Anesthesiologist and Surgeon  Anesthesia Plan Comments:        Anesthesia Quick Evaluation

## 2013-07-24 NOTE — Progress Notes (Signed)
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          VVS to schedule AV access placement on Friday  Subjective: Interval History: Agreeable to AV surgery   Objective: Vital signs in last 24 hours: Temp:  [97.2 F (36.2 C)-97.7 F (36.5 C)] 97.7 F (36.5 C) (08/28 0703) Pulse Rate:  [55-59] 55 (08/28 0703) Resp:  [18-22] 22 (08/28 0703) BP: (119-142)/(71-79) 127/79 mmHg (08/28 0703) SpO2:  [100 %] 100 % (08/28 0703) Weight:  [180.1 kg (397 lb 0.8 oz)] 180.1 kg (397 lb 0.8 oz) (08/28 0703) Weight change:   Intake/Output from previous day: 08/27 0701 - 08/28 0700 In: 1203 [P.O.:1200; I.V.:3] Out: 601 [Urine:600; Stool:1] Intake/Output this shift: Total I/O In: 120 [P.O.:120] Out: 700 [Urine:700]  General appearance: alert, cooperative and HOH Head: Normocephalic, without obvious abnormality, atraumatic GI: soft, non-tender; bowel sounds normal; no masses,  no organomegaly and protuberant Extremities: edema 2+  Lab Results:  Recent Labs  07/23/13 0708 07/24/13 0421  WBC 12.7* 14.6*  HGB 11.3* 10.6*  HCT 39.1 36.5  PLT 193 217   BMET:  Recent Labs  07/23/13 0708 07/24/13 0421  NA 141 142  K 4.2 4.4  CL 106 108  CO2 23 23  GLUCOSE 95 108*  BUN 75* 76*  CREATININE 5.82* 6.01*  CALCIUM 8.9 9.0   No results found for this basename: PTH,  in the last 72 hours Iron Studies: No results found for this basename: IRON, TIBC, TRANSFERRIN, FERRITIN,  in the last 72 hours Studies/Results: No results found.  Scheduled: . aspirin EC  81 mg Oral q morning - 10a  . calcitRIOL  0.25 mcg Oral Daily  . calcium carbonate  1 tablet Oral BID WC  . DULoxetine  60 mg Oral Daily  . famotidine  20 mg Oral QHS  . feeding supplement  1 Container Oral Q24H  . ferrous sulfate  325 mg Oral BID  . folic acid   1 mg Oral Daily  . furosemide  80 mg Oral BID  . heparin  5,000 Units Subcutaneous Q8H  . hydrALAZINE  25 mg Oral Q8H  . insulin aspart  0-15 Units Subcutaneous TID WC  . insulin aspart  0-5 Units Subcutaneous QHS  . isosorbide mononitrate  30 mg Oral q morning - 10a  . levofloxacin  500 mg Oral Q48H  . levothyroxine  25 mcg Oral QAC breakfast  . magnesium oxide  400 mg Oral BID  . nebivolol  10 mg Oral BID  . saccharomyces boulardii  250 mg Oral BID  . senna-docusate  1 tablet Oral BID  . simvastatin  20 mg Oral q1800  . sodium bicarbonate  650 mg Oral BID  . sodium chloride  3 mL Intravenous Q12H  . zinc sulfate  220 mg Oral Daily     LOS: 8 days   [name] C 07/24/2013,1:06 PM

## 2013-07-24 NOTE — Progress Notes (Signed)
Pt.A/Ox4 and is two person assist. She had no c/o pain and no signs of distress.

## 2013-07-24 NOTE — Progress Notes (Signed)
Occupational Therapy Treatment Patient Details Name: [name] MRN: [id number] DOB: 21-Nov-1961 Today's Date: 07/24/2013 Time: [id number] OT Time Calculation (min): 26 min  OT Assessment / Plan / Recommendation  History of present illness [name] is an 52 y.o. female with multiple medical problems including DM, morbid obesity, asthma, CHF (Epic said systolic, but EF 60 percent, so likely diastolic), CKD 3, Hypothyroidism presents to the ERWith 3 days hx of increase shortness of breath, chest tightness, and some orthopnea.  She was having some wheezing, and had seen her pulmonologist, who tx her for possible GERD.  She denied fever, chills, or coughs.  But  Admitted to have some orthopnea.  Her legs have been swelling more, but she has no calf tenderness.  Work up in the ER included a CXR which showed cardiomegaly and borderline CHF.  Her WBC was 14K, and her Cr is 4.4.She has multiple peri area wounds that are draining.   OT comments  Pt making progress.  Follow Up Recommendations  SNF       Equipment Recommendations  None recommended by OT       Frequency Min 2X/week   Progress towards OT Goals Progress towards OT goals: Progressing toward goals  Plan Discharge plan remains appropriate    Precautions / Restrictions Precautions Precautions: Fall Precaution Comments: mobidly obese Restrictions Weight Bearing Restrictions: No       ADL  Grooming: Wash/dry face;Teeth care;Min guard Where Assessed - Grooming: Unsupported standing (at sink) Toilet Transfer: +2 Total assistance (for sit<>stand) Toilet Transfer: Patient Percentage: 70% Toilet Transfer Method: Sit to Loss adjuster, chartered:  (Bed>to door>sit in Psychologist, occupational) Equipment Used: Gait belt;Rolling walker Transfers/Ambulation Related to ADLs: Total A +2 (pt=70%) sit<>stand      OT Goals(current goals can now be found in the care plan section)    Visit Information  Last OT Received On: 07/24/13 Assistance  Needed: +2 History of Present Illness: [name] is an 52 y.o. female with multiple medical problems including DM, morbid obesity, asthma, CHF (Epic said systolic, but EF 60 percent, so likely diastolic), CKD 3, Hypothyroidism presents to the ERWith 3 days hx of increase shortness of breath, chest tightness, and some orthopnea.  She was having some wheezing, and had seen her pulmonologist, who tx her for possible GERD.  She denied fever, chills, or coughs.  But  Admitted to have some orthopnea.  Her legs have been swelling more, but she has no calf tenderness.  Work up in the ER included a CXR which showed cardiomegaly and borderline CHF.  Her WBC was 14K, and her Cr is 4.4.She has multiple peri area wounds that are draining.          Cognition  Cognition Arousal/Alertness: Awake/alert Behavior During Therapy: WFL for tasks assessed/performed Overall Cognitive Status: Within Functional Limits for tasks assessed    Mobility  Bed Mobility Bed Mobility: Supine to Sit;Sitting - Scoot to Edge of Bed Supine to Sit: 1: +2 Total assist Supine to Sit: Patient Percentage: 20% Sitting - Scoot to Edge of Bed: 1: +2 Total assist Sitting - Scoot to Edge of Bed: Patient Percentage: 20% Details for Bed Mobility Assistance: Pt cannot roll at this time due to her abdominal girth that is very solid and tight. Transfers Transfers: Sit to Stand;Stand to Sit Sit to Stand: 1: +2 Total assist;With upper extremity assist;From bed Sit to Stand: Patient Percentage: 70% Stand to Sit: 1: +2 Total assist;With upper extremity assist;With armrests;To chair/3-in-1 Stand to Sit: Patient Percentage:  70%          End of Session OT - End of Session Equipment Utilized During Treatment: Rolling walker;Gait belt Activity Tolerance: Patient tolerated treatment well Patient left: in chair;with call bell/phone within reach;with family/visitor present Nurse Communication:  (+2 back to bed with RW in room)       Almon Register [id number] 07/24/2013, 3:36 PM

## 2013-07-24 NOTE — Progress Notes (Signed)
[facility] PROGRESS NOTE  [name] [id number] DOB: [birth year] DOA: 07/16/2013 PCP: [name], MD  HPI/Subjective:  No significant overnight events. Patient reported some improvement in her shortness of breath today. She has agreed to placement of AV graft and the initiation of hemodialysis. Procedure scheduled for tomorrow. Otherwise she seems to be stable, ambulated with physical therapy to doorway and back, [place] for nearly an hour. Tolerating by mouth intake, presently afebrile.   Assessment/Plan:  CHF, acute on chronic combined -Increased fluid overload, will likely be started on hemodialysis. -Continue Imdur, Bystolic and hydralazine. -Status post transthoracic echocardiogram performed on 07/17/2013 with LVEF of 50-55%, improved from 35% in 2011, has grade 2 diastolic dysfunction.  Acute on chronic renal failure  -Patient with a history of chronic kidney disease having a creatinine of 2.85, BUN of 40 on the seventh 18 2014. On not 06/30/2013 her creatinine increased to 5.01. Her creatinine continues to trend upwards with a.m. lab work now shown a creatinine of 6.01. This is in spite of holding diuretics. Patient will undergo AV fistula placement tomorrow for the initiation of hemodialysis.  VRE UTI -Urine culture grew VRE, patient was on vancomycin and switched to levofloxacin. Pharmacy consulted for Levaquin dosing. -Blood culture is sterile to date, patient remained afebrile, hemodynamically stable.   Diabetes mellitus type 2 -Controlled diabetes mellitus type 2 with hemoglobin A1c of 5.7, this indicates mean plasma glucose of 117. -Continue insulin sliding scale.  Hidradenitis suppurativa -Multiple areas of abscesses, with purulent discharge. -Continue Levaquin therapy.  -Added Hibiclens for wound cleansing. -Patient was offered surgical consultation for her cellulitis/hydradenitis suppurativa, patient declined.  Rectovaginal fistula -Referred to Dr.  Harolyn Rutherford [facility] as outpatient.  OSA -Placed on CPAP the hospital through nasal mask, tolerated that very well. -Reported that she is not on CPAP at home.  Code Status: Full code Family Communication: Plan discussed with patient Disposition Plan: Remains the patient.   Consultants:  [employer].  Procedures:  None  Antibiotics:  None   Objective: Filed Vitals:   07/24/13 1413  BP: 116/65  Pulse: 56  Temp: 97 F (36.1 C)  Resp: 22    Intake/Output Summary (Last 24 hours) at 07/24/13 1644 Last data filed at 07/24/13 1600  Gross per 24 hour  Intake   1083 ml  Output    951 ml  Net    132 ml   Filed Weights   07/21/13 0500 07/22/13 0650 07/24/13 0703  Weight: 165.79 kg (365 lb 8 oz) 166.924 kg (368 lb) 180.1 kg (397 lb 0.8 oz)    Exam: General: Alert and awake, oriented x3, not in any acute distress. Morbid obesity HEENT: anicteric sclera, pupils reactive to light and accommodation, EOMI CVS: S1-S2 clear, no murmur rubs or gallops Chest: clear to auscultation bilaterally, no wheezing, rales or rhonchi Abdomen: soft nontender, nondistended, normal bowel sounds, no organomegaly Extremities: +1 to +2 pedal edema bilaterally. Neuro: Cranial nerves II-XII intact, no focal neurological deficits Skin: Right axillary swelling noted no purulent discharge. There is right inguinal swelling erythema pain to palpation.  Data Reviewed: Basic Metabolic Panel:  Recent Labs Lab 07/19/13 0500 07/20/13 0715 07/21/13 0650 07/22/13 0500 07/23/13 0708 07/24/13 0421  NA 141 140 142 144 141 142  K 4.1 4.0 4.2 4.3 4.2 4.4  CL 108 106 108 109 106 108  CO2 21 22 23 23 23 23   GLUCOSE 120* 122* 105* 105* 95 108*  BUN 65* 68* 69* 74* 75* 76*  CREATININE 5.52* 5.60*  5.66* 5.85* 5.82* 6.01*  CALCIUM 8.5 9.0 9.2 8.9 8.9 9.0  MG 2.2 2.2 2.3 2.3 2.4 2.5  PHOS 4.8*  --  5.1* 4.8* 4.5 4.9*   Liver Function Tests:  Recent Labs Lab 07/18/13 0500 07/19/13 0500  07/20/13 0715 07/21/13 0650 07/22/13 0500 07/23/13 0708 07/24/13 0421  AST 9 7 7   --   --   --   --   ALT <5 <5 <5  --   --   --   --   ALKPHOS 71 76 74  --   --   --   --   BILITOT 0.3 0.3 0.3  --   --   --   --   PROT 8.5* 8.3 9.1*  --   --   --   --   ALBUMIN 2.6* 2.3* 2.6* 2.4* 2.2* 2.2* 2.3*   No results found for this basename: LIPASE, AMYLASE,  in the last 168 hours No results found for this basename: AMMONIA,  in the last 168 hours CBC:  Recent Labs Lab 07/20/13 0715 07/21/13 0650 07/22/13 0500 07/23/13 0708 07/24/13 0421  WBC 13.7* 12.0* 13.6* 12.7* 14.6*  NEUTROABS 11.5* 9.8* 11.2* 10.1* 11.8*  HGB 12.0 11.6* 11.4* 11.3* 10.6*  HCT 40.6 40.5 38.4 39.1 36.5  MCV 92.7 93.8 92.1 92.2 92.9  PLT 218 202 213 193 217   Cardiac Enzymes: No results found for this basename: CKTOTAL, CKMB, CKMBINDEX, TROPONINI,  in the last 168 hours BNP (last 3 results)  Recent Labs  08/16/12 2049 11/10/12 1724 07/16/13 1522  PROBNP 26806.0* 33908.0* 29655.0*   CBG:  Recent Labs Lab 07/23/13 1105 07/23/13 1622 07/23/13 2058 07/24/13 0602 07/24/13 1145  GLUCAP 140* 136* 148* 104* 122*    Recent Results (from the past 240 hour(s))  URINE CULTURE     Status: None   Collection Time    07/17/13 12:29 PM      Result Value Range Status   Specimen Description URINE, CATHETERIZED   Final   Special Requests NONE   Final   Culture  Setup Time     Final   Value: 07/17/2013 16:43     Performed at [facility]     Final   Value: >=100,000 COLONIES/ML     Performed at [facility]   Culture     Final   Value: VANCOMYCIN RESISTANT ENTEROCOCCUS ISOLATED     Note: CRITICAL RESULT CALLED TO, READ BACK BY AND VERIFIED WITH: ALLEN T. @ 11:00 6M 8.25.14 BY DESIS     Performed at [facility]   Report Status 07/21/2013 FINAL   Final   Organism ID, Bacteria VANCOMYCIN RESISTANT ENTEROCOCCUS ISOLATED   Final  CULTURE, BLOOD (ROUTINE X 2)      Status: None   Collection Time    07/17/13  3:27 PM      Result Value Range Status   Specimen Description BLOOD LEFT HAND   Final   Special Requests BOTTLES DRAWN AEROBIC ONLY 10CC   Final   Culture  Setup Time     Final   Value: 07/17/2013 22:26     Performed at [facility]   Culture     Final   Value: NO GROWTH 5 DAYS     Performed at [facility]   Report Status 07/23/2013 FINAL   Final  CULTURE, BLOOD (ROUTINE X 2)     Status: None   Collection Time  07/17/13  3:33 PM      Result Value Range Status   Specimen Description BLOOD RIGHT HAND   Final   Special Requests BOTTLES DRAWN AEROBIC ONLY 5CC   Final   Culture  Setup Time     Final   Value: 07/17/2013 22:26     Performed at [facility]   Culture     Final   Value: NO GROWTH 5 DAYS     Performed at [facility]   Report Status 07/23/2013 FINAL   Final  URINE CULTURE     Status: None   Collection Time    07/18/13  8:31 AM      Result Value Range Status   Specimen Description URINE, CATHETERIZED   Final   Special Requests NONE   Final   Culture  Setup Time     Final   Value: 07/18/2013 13:49     Performed at [facility]     Final   Value: 60,000 COLONIES/ML     Performed at [facility]   Culture     Final   Value: ENTEROCOCCUS SPECIES     Note: SUSCEPTIBILITIES PERFORMED ON PREVIOUS CULTURE WITHIN THE LAST 5 DAYS.     Performed at [facility]   Report Status 07/21/2013 FINAL   Final     Studies: No results found.  Scheduled Meds: . aspirin EC  81 mg Oral q morning - 10a  . calcitRIOL  0.25 mcg Oral Daily  . calcium carbonate  1 tablet Oral BID WC  . DULoxetine  60 mg Oral Daily  . famotidine  20 mg Oral QHS  . feeding supplement  1 Container Oral Q24H  . ferrous sulfate  325 mg Oral BID  . folic acid  1 mg Oral Daily  . furosemide  80 mg Oral BID  . heparin  5,000 Units Subcutaneous Q8H  . hydrALAZINE  25 mg Oral Q8H  . insulin  aspart  0-15 Units Subcutaneous TID WC  . insulin aspart  0-5 Units Subcutaneous QHS  . isosorbide mononitrate  30 mg Oral q morning - 10a  . levofloxacin  500 mg Oral Q48H  . levothyroxine  25 mcg Oral QAC breakfast  . magnesium oxide  400 mg Oral BID  . nebivolol  10 mg Oral BID  . saccharomyces boulardii  250 mg Oral BID  . senna-docusate  1 tablet Oral BID  . simvastatin  20 mg Oral q1800  . sodium bicarbonate  650 mg Oral BID  . sodium chloride  3 mL Intravenous Q12H  . [START ON 07/25/2013] vancomycin  1,000 mg Intravenous To OR  . zinc sulfate  220 mg Oral Daily   Continuous Infusions:   Principal Problem:   Diastolic CHF Active Problems:   Morbid obesity   GERD   DM type 2 (diabetes mellitus, type 2)   Hypertension   Renal failure (ARF), acute on chronic   SOB (shortness of breath)   OSA (obstructive sleep apnea)   UTI (urinary tract infection)    Time spent: 35 minutes    Kelvin Cellar  [facility] Pager [phone]. If 7PM-7AM, please contact night-coverage at [email or web address], password [facility] 07/24/2013, 4:44 PM  LOS: 8 days

## 2013-07-24 NOTE — Progress Notes (Signed)
Pt has refused CPAP machine for tonight, says she is OK on [place], in no distress, resting well. RT will continue to assist as needed.

## 2013-07-24 NOTE — Progress Notes (Signed)
07/24/13 1105 Noted CSW notes of pt. refusing SNF, and wants to go home with home health.  Pt. stated she has used [facility] in past and was satisfied with their services.  Pt. to have fistula placed on Friday for HD. Will obtain orders for [facility] RN/PT/OT/aide.  NCM will continue to follow for dc needs. Llana Aliment, RN, BSN NCM [phone]

## 2013-07-25 ENCOUNTER — Other Ambulatory Visit: Payer: Self-pay | Admitting: *Deleted

## 2013-07-25 ENCOUNTER — Ambulatory Visit: Payer: BC Managed Care – PPO | Admitting: Internal Medicine

## 2013-07-25 ENCOUNTER — Telehealth: Payer: Self-pay | Admitting: Vascular Surgery

## 2013-07-25 ENCOUNTER — Encounter (HOSPITAL_COMMUNITY): Payer: Self-pay | Admitting: Anesthesiology

## 2013-07-25 ENCOUNTER — Inpatient Hospital Stay (HOSPITAL_COMMUNITY): Payer: BC Managed Care – PPO | Admitting: Anesthesiology

## 2013-07-25 ENCOUNTER — Encounter (HOSPITAL_COMMUNITY)
Admission: EM | Disposition: A | Discharge status: Home Health | Payer: Self-pay | Source: Home / Self Care | Attending: Internal Medicine

## 2013-07-25 DIAGNOSIS — Z4931 Encounter for adequacy testing for hemodialysis: Secondary | ICD-10-CM

## 2013-07-25 DIAGNOSIS — N186 End stage renal disease: Secondary | ICD-10-CM

## 2013-07-25 HISTORY — PX: AV FISTULA PLACEMENT: SHX1204

## 2013-07-25 LAB — RENAL FUNCTION PANEL
CO2: 20 mEq/L (ref 19–32)
Calcium: 9.1 mg/dL (ref 8.4–10.5)
Creatinine, Ser: 6 mg/dL — ABNORMAL HIGH (ref 0.50–1.10)
Glucose, Bld: 99 mg/dL (ref 70–99)

## 2013-07-25 LAB — GLUCOSE, CAPILLARY
Glucose-Capillary: 97 mg/dL (ref 70–99)
Glucose-Capillary: 108 mg/dL — ABNORMAL HIGH (ref 70–99)

## 2013-07-25 LAB — CBC WITH DIFFERENTIAL/PLATELET
Basophils Absolute: 0 10*3/uL (ref 0.0–0.1)
Basophils Relative: 0 % (ref 0–1)
Eosinophils Relative: 6 % — ABNORMAL HIGH (ref 0–5)
HCT: 37.5 % (ref 36.0–46.0)
MCHC: 29.6 g/dL — ABNORMAL LOW (ref 30.0–36.0)
MCV: 92.8 fL (ref 78.0–100.0)
Monocytes Absolute: 0.7 10*3/uL (ref 0.1–1.0)
Neutro Abs: 11.9 10*3/uL — ABNORMAL HIGH (ref 1.7–7.7)
Platelets: 206 10*3/uL (ref 150–400)
RDW: 18.4 % — ABNORMAL HIGH (ref 11.5–15.5)

## 2013-07-25 LAB — MAGNESIUM: Magnesium: 2.6 mg/dL — ABNORMAL HIGH (ref 1.5–2.5)

## 2013-07-25 SURGERY — ARTERIOVENOUS (AV) FISTULA CREATION
Anesthesia: General | Site: Arm Upper | Laterality: Left | Wound class: Clean

## 2013-07-25 MED ORDER — PROMETHAZINE HCL 25 MG/ML IJ SOLN: 6.2500 mg | INTRAMUSCULAR | Status: DC | PRN

## 2013-07-25 MED ORDER — HYDROMORPHONE HCL PF 1 MG/ML IJ SOLN: 0.2500 mg | INTRAMUSCULAR | Status: DC | PRN

## 2013-07-25 MED ORDER — SODIUM CHLORIDE 0.9 % IR SOLN
Status: DC | PRN
  Administered 2013-07-25: 11:00:00

## 2013-07-25 MED ORDER — PROPOFOL 10 MG/ML IV BOLUS
INTRAVENOUS | Status: DC | PRN
  Administered 2013-07-25: 150 mg via INTRAVENOUS

## 2013-07-25 MED ORDER — SODIUM CHLORIDE 0.9 % IV SOLN
INTRAVENOUS | Status: DC | PRN
  Administered 2013-07-25 (×2): via INTRAVENOUS

## 2013-07-25 MED ORDER — PHENYLEPHRINE HCL 10 MG/ML IJ SOLN
INTRAMUSCULAR | Status: DC | PRN
  Administered 2013-07-25: 120 ug via INTRAVENOUS

## 2013-07-25 MED ORDER — OXYCODONE HCL 5 MG/5ML PO SOLN: 5.0000 mg | Freq: Once | ORAL | Status: DC | PRN

## 2013-07-25 MED ORDER — SUCCINYLCHOLINE CHLORIDE 20 MG/ML IJ SOLN
INTRAMUSCULAR | Status: DC | PRN
  Administered 2013-07-25: 120 mg via INTRAVENOUS

## 2013-07-25 MED ORDER — EPHEDRINE SULFATE 50 MG/ML IJ SOLN
INTRAMUSCULAR | Status: DC | PRN
  Administered 2013-07-25: 25 mg via INTRAVENOUS
  Administered 2013-07-25: 10 mg via INTRAVENOUS
  Administered 2013-07-25: 15 mg via INTRAVENOUS

## 2013-07-25 MED ORDER — FENTANYL CITRATE 0.05 MG/ML IJ SOLN
INTRAMUSCULAR | Status: DC | PRN
  Administered 2013-07-25: 100 ug via INTRAVENOUS

## 2013-07-25 MED ORDER — 0.9 % SODIUM CHLORIDE (POUR BTL) OPTIME
TOPICAL | Status: DC | PRN
  Administered 2013-07-25: 1000 mL

## 2013-07-25 MED ORDER — BUPIVACAINE-EPINEPHRINE (PF) 0.5% -1:200000 IJ SOLN
INTRAMUSCULAR | Status: AC
  Filled 2013-07-25: qty 10

## 2013-07-25 MED ORDER — OXYCODONE HCL 5 MG PO TABS: 5.0000 mg | ORAL_TABLET | Freq: Once | ORAL | Status: DC | PRN

## 2013-07-25 MED ORDER — LIDOCAINE HCL (CARDIAC) 20 MG/ML IV SOLN
INTRAVENOUS | Status: DC | PRN
  Administered 2013-07-25: 100 mg via INTRAVENOUS

## 2013-07-25 SURGICAL SUPPLY — 43 items
APL SKNCLS STERI-STRIP NONHPOA (GAUZE/BANDAGES/DRESSINGS) ×1
ARMBAND PINK RESTRICT EXTREMIT (MISCELLANEOUS) ×2 IMPLANT
BENZOIN TINCTURE PRP APPL 2/3 (GAUZE/BANDAGES/DRESSINGS) ×2 IMPLANT
CANISTER SUCTION 2500CC (MISCELLANEOUS) ×2 IMPLANT
CLIP LIGATING EXTRA MED SLVR (CLIP) ×2 IMPLANT
CLIP LIGATING EXTRA SM BLUE (MISCELLANEOUS) ×2 IMPLANT
CLOTH BEACON ORANGE TIMEOUT ST (SAFETY) ×2 IMPLANT
COVER PROBE W GEL 5X96 (DRAPES) ×2 IMPLANT
COVER SURGICAL LIGHT HANDLE (MISCELLANEOUS) ×2 IMPLANT
DECANTER SPIKE VIAL GLASS SM (MISCELLANEOUS) ×2 IMPLANT
ELECT REM PT RETURN 9FT ADLT (ELECTROSURGICAL) ×2
ELECTRODE REM PT RTRN 9FT ADLT (ELECTROSURGICAL) ×1 IMPLANT
GAUZE SPONGE 2X2 8PLY STRL LF (GAUZE/BANDAGES/DRESSINGS) IMPLANT
GEL ULTRASOUND 20GR AQUASONIC (MISCELLANEOUS) IMPLANT
GLOVE BIO SURGEON STRL SZ 6.5 (GLOVE) ×2 IMPLANT
GLOVE BIOGEL PI IND STRL 6.5 (GLOVE) IMPLANT
GLOVE BIOGEL PI IND STRL 7.0 (GLOVE) IMPLANT
GLOVE BIOGEL PI INDICATOR 6.5 (GLOVE) ×2
GLOVE BIOGEL PI INDICATOR 7.0 (GLOVE) ×1
GLOVE ECLIPSE 6.0 STRL STRAW (GLOVE) ×1 IMPLANT
GLOVE SS BIOGEL STRL SZ 7.5 (GLOVE) ×1 IMPLANT
GLOVE SUPERSENSE BIOGEL SZ 7.5 (GLOVE) ×1
GLOVE SURG SS PI 6.0 STRL IVOR (GLOVE) ×1 IMPLANT
GOWN STRL NON-REIN LRG LVL3 (GOWN DISPOSABLE) ×7 IMPLANT
HOVERMATT SINGLE USE (MISCELLANEOUS) ×1 IMPLANT
KIT BASIN OR (CUSTOM PROCEDURE TRAY) ×2 IMPLANT
KIT ROOM TURNOVER OR (KITS) ×2 IMPLANT
NDL HYPO 25GX1X1/2 BEV (NEEDLE) ×1 IMPLANT
NEEDLE HYPO 25GX1X1/2 BEV (NEEDLE) ×2 IMPLANT
NS IRRIG 1000ML POUR BTL (IV SOLUTION) ×2 IMPLANT
PACK CV ACCESS (CUSTOM PROCEDURE TRAY) ×2 IMPLANT
PAD ARMBOARD 7.5X6 YLW CONV (MISCELLANEOUS) ×4 IMPLANT
SPONGE GAUZE 2X2 STER 10/PKG (GAUZE/BANDAGES/DRESSINGS) ×1
SPONGE GAUZE 4X4 12PLY (GAUZE/BANDAGES/DRESSINGS) ×2 IMPLANT
STRIP CLOSURE SKIN 1/2X4 (GAUZE/BANDAGES/DRESSINGS) ×2 IMPLANT
SUT PROLENE 6 0 CC (SUTURE) ×2 IMPLANT
SUT VIC AB 3-0 SH 27 (SUTURE) ×2
SUT VIC AB 3-0 SH 27X BRD (SUTURE) ×1 IMPLANT
TAPE CLOTH SURG 4X10 WHT LF (GAUZE/BANDAGES/DRESSINGS) ×1 IMPLANT
TOWEL OR 17X24 6PK STRL BLUE (TOWEL DISPOSABLE) ×2 IMPLANT
TOWEL OR 17X26 10 PK STRL BLUE (TOWEL DISPOSABLE) ×2 IMPLANT
UNDERPAD 30X30 INCONTINENT (UNDERPADS AND DIAPERS) ×2 IMPLANT
WATER STERILE IRR 1000ML POUR (IV SOLUTION) ×2 IMPLANT

## 2013-07-25 NOTE — Anesthesia Postprocedure Evaluation (Signed)
Anesthesia Post Note  Patient: [name]  Procedure(s) Performed: Procedure(s) (LRB): ARTERIOVENOUS (AV) FISTULA CREATION  (Left)  Anesthesia type: general  Patient location: PACU  Post pain: Pain level controlled  Post assessment: Patient's Cardiovascular Status Stable   Post vital signs: Reviewed and stable  Level of consciousness: sedated  Complications: No apparent anesthesia complications

## 2013-07-25 NOTE — Progress Notes (Signed)
Acute on chronic kidney disease S/P LUE AVF today, Dr. 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 Probably ok to be discharge when other medical issued stabilized   Subjective: Interval History: OR today for AV access. She reports body swelling much improved.  Objective: Vital signs in last 24 hours: Temp:  [97 F (36.1 C)-97.6 F (36.4 C)] 97 F (36.1 C) (08/29 0954) Pulse Rate:  [56-64] 63 (08/29 1245) Resp:  [20-32] 31 (08/29 1245) BP: (115-145)/(50-80) 127/50 mmHg (08/29 1237) SpO2:  [91 %-99 %] 94 % (08/29 1245) Weight:  [179.987 kg (396 lb 12.8 oz)] 179.987 kg (396 lb 12.8 oz) (08/29 0755) Weight change:   Intake/Output from previous day: 08/28 0701 - 08/29 0700 In: 1046 [P.O.:840; I.V.:6; IV Piggyback:200] Out: 1150 [Urine:1150] Intake/Output this shift: Total I/O In: 500 [I.V.:500] Out: 300 [Urine:300]  General appearance: alert and cooperative GI: soft, non-tender; bowel sounds normal; no masses,  no organomegaly Extremities: edema 1-2+  Lab Results:  Recent Labs  07/24/13 0421 07/25/13 0600  WBC 14.6* 14.6*  HGB 10.6* 11.1*  HCT 36.5 37.5  PLT 217 206   BMET:  Recent Labs  07/24/13 0421 07/25/13 0600  NA 142 141  K 4.4 4.7  CL 108 106  CO2 23 20  GLUCOSE 108* 99  BUN 76* 79*  CREATININE 6.01* 6.00*  CALCIUM 9.0 9.1   No results found for this basename: PTH,  in the last 72 hours Iron Studies: No results found for this basename: IRON, TIBC, TRANSFERRIN, FERRITIN,  in the last 72 hours Studies/Results: No results found.  Scheduled: . [facility] HOLD] aspirin EC  81 mg Oral q morning - 10a  . [facility] HOLD] calcitRIOL  0.25 mcg Oral Daily  . [facility] HOLD] calcium carbonate  1 tablet Oral BID WC  . [facility] HOLD] DULoxetine  60 mg Oral Daily  . [facility] HOLD] famotidine  20 mg Oral QHS  . [[facility] HOLD] feeding  supplement  1 Container Oral Q24H  . [facility] HOLD] ferrous sulfate  325 mg Oral BID  . [facility] HOLD] folic acid  1 mg Oral Daily  . [facility] HOLD] furosemide  80 mg Oral BID  . [MAR HOLD] heparin  5,000 Units Subcutaneous Q8H  . [[facility] HOLD] hydrALAZINE  25 mg Oral Q8H  . [MAR HOLD] insulin aspart  0-15 Units Subcutaneous TID WC  . [MAR HOLD] insulin aspart  0-5 Units Subcutaneous QHS  . [facility] HOLD] isosorbide mononitrate  30 mg Oral q morning - 10a  . [facility] HOLD] levofloxacin  500 mg Oral Q48H  . [facility] HOLD] levothyroxine  25 mcg Oral QAC breakfast  . [[facility] HOLD] magnesium oxide  400 mg Oral BID  . [[facility] HOLD] nebivolol  10 mg Oral BID  . [MAR HOLD] saccharomyces boulardii  250 mg Oral BID  . [MAR HOLD] senna-docusate  1 tablet Oral BID  . [facility] HOLD] simvastatin  20 mg Oral q1800  . [facility] HOLD] sodium bicarbonate  650 mg Oral BID  . [MAR HOLD] sodium chloride  3 mL Intravenous Q12H  . [facility] HOLD] zinc sulfate  220 mg Oral Daily     LOS: 9 days   Jp Eastham C 07/25/2013,12:51 PM

## 2013-07-25 NOTE — H&P (View-Only) (Signed)
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 Female admitted secondary to SOB, chest tightness, and orthopnea.  Extreme difficulty with ambulation. She has had several admissions for SOB/DOE and has had acute on chronic RF requiring intermittent HD. We were asked to evaluate for possible permanent access.  The pt  Is HOH and her mother was present for this evaluation.  Pt denies any paresthesias in her upper extremities.  She is right handed.  She has had no prior access procedures other than catheters.  She currently has not yet decided whether or not she wants chronic hemodialysis.  She currently is not requiring hemodialysis.  Had vein mapping 7/13.  She has had hydradenitis in the axilla before but not currently active.  Prior episodes also in groin and intertriginous area of breast.   Medical problems include DM, hypercholesterolemia,obesity, asthma, and CHF.        Current Facility-Administered Medications  Medication Dose Route Frequency Provider Last Rate Last Dose  . aspirin EC tablet 81 mg  81 mg Oral q morning - 10a Orvan Falconer, MD   81 mg at 07/22/13 1006  . calcitRIOL (ROCALTROL) capsule 0.25 mcg  0.25 mcg Oral Daily Orvan Falconer, MD   0.25 mcg at 07/22/13 1010  . calcium carbonate (OS-CAL - dosed in mg of elemental calcium) tablet 500 mg of elemental calcium  1 tablet Oral BID WC Thurnell Lose, MD   500 mg of elemental calcium at 07/22/13 0637  . chlorhexidine (HIBICLENS) 4 % liquid   Topical Daily PRN Verlee Monte, MD      . cyclobenzaprine (FLEXERIL) tablet 10 mg  10 mg Oral TID PRN Orvan Falconer, MD   10 mg at 07/19/13 0256  . DULoxetine (CYMBALTA) DR capsule 60 mg  60 mg Oral Daily Orvan Falconer, MD   60 mg at 07/22/13 1006  . famotidine (PEPCID) tablet 20 mg  20 mg Oral QHS Orvan Falconer, MD   20 mg at 07/21/13 2235  . feeding supplement  (RESOURCE BREEZE) liquid 1 Container  1 Container Oral Q24H Erlene Quan, RD   1 Container at 07/20/13 1500  . ferrous sulfate tablet 325 mg  325 mg Oral BID Orvan Falconer, MD   325 mg at 07/22/13 1006  . folic acid (FOLVITE) tablet 1 mg  1 mg Oral Daily Orvan Falconer, MD   1 mg at 07/22/13 1006  . heparin injection 5,000 Units  5,000 Units Subcutaneous Q8H Orvan Falconer, MD   5,000 Units at 07/22/13 [phone]  . hydrALAZINE (APRESOLINE) tablet 25 mg  25 mg Oral Q8H Orvan Falconer, MD   25 mg at 07/22/13 0641  . insulin aspart (novoLOG) injection 0-15 Units  0-15 Units Subcutaneous TID WC Orvan Falconer, MD   2 Units at 07/21/13 1650  . insulin aspart (novoLOG) injection 0-5 Units  0-5 Units Subcutaneous QHS Orvan Falconer, MD      . isosorbide mononitrate (IMDUR) 24 hr tablet 30 mg  30 mg Oral q morning - 10a Orvan Falconer, MD   30 mg at 07/22/13 1006  . levofloxacin (LEVAQUIN) tablet 750 mg  750 mg Oral Q48H Norva Riffle, RPH   750 mg at 07/21/13 1649  . levothyroxine (SYNTHROID, LEVOTHROID) tablet 25 mcg  25 mcg Oral QAC breakfast Orvan Falconer, MD   25 mcg at 07/22/13 [phone]  . magnesium oxide (MAG-OX) tablet  400 mg  400 mg Oral BID Orvan Falconer, MD   400 mg at 07/22/13 1006  . morphine 2 MG/ML injection 2 mg  2 mg Intravenous Q4H PRN Orvan Falconer, MD   2 mg at 07/18/13 1103  . nebivolol (BYSTOLIC) tablet 10 mg  10 mg Oral BID Orvan Falconer, MD   10 mg at 07/22/13 1006  . oxyCODONE-acetaminophen (PERCOCET/ROXICET) 5-325 MG per tablet 1-2 tablet  1-2 tablet Oral Q6H PRN Verlee Monte, MD   2 tablet at 07/22/13 0955  . saccharomyces boulardii (FLORASTOR) capsule 250 mg  250 mg Oral BID Orvan Falconer, MD   250 mg at 07/22/13 1006  . senna-docusate (Senokot-S) tablet 1 tablet  1 tablet Oral BID Orvan Falconer, MD   1 tablet at 07/22/13 1006  . simvastatin (ZOCOR) tablet 20 mg  20 mg Oral q1800 Orvan Falconer, MD   20 mg at 07/21/13 1806  . sodium bicarbonate tablet 650 mg  650 mg Oral BID Orvan Falconer, MD   650 mg at 07/22/13 1006  . sodium chloride 0.9 % injection 3 mL   3 mL Intravenous Q12H Orvan Falconer, MD   3 mL at 07/22/13 1008  . zinc sulfate capsule 220 mg  220 mg Oral Daily Orvan Falconer, MD   220 mg at 07/22/13 1006    Pt meds include: Statin :Yes Betablocker: Yes ASA: Yes Other anticoagulants/antiplatelets: none  Past Medical History  Diagnosis Date  . Systolic congestive heart failure   . SOB (shortness of breath)   . HTN (hypertension)   . Iron deficiency anemia   . Morbid obesity   . Hyperkalemia   . Hypothyroidism   . CHF (congestive heart failure)   . Hydradenitis   . Pneumonia Nov 10, 2012  . DM type 2 (diabetes mellitus, type 2)     diet controlled  . History of hemodialysis dec 2013    none currently  . Urinary tract infection     taking antibiotics for 3 days prior to surgery  . Chronic kidney disease (CKD), stage III (moderate)     [name] nephrology lov note 05-12-2013 on pt chart  . GERD (gastroesophageal reflux disease)     Past Surgical History  Procedure Laterality Date  . Portacath placement    . Cystoscopy w/ ureteral stent placement  05/28/2012    Procedure: CYSTOSCOPY WITH RETROGRADE PYELOGRAM/URETERAL STENT PLACEMENT;  Surgeon: Reece Packer, MD;  Location: [facility];  Service: Urology;  Laterality: Left;  . Cystoscopy w/ retrogrades  11/16/2012    Procedure: CYSTOSCOPY WITH RETROGRADE PYELOGRAM;  Surgeon: Reece Packer, MD;  Location: [facility];  Service: Urology;  Laterality: Bilateral;  CYSTOSCOPY,BILATERAL RETROGRADE PYELOGRAM/ REMOVAL LEFT URETERAL STENT/ FULGERATION BLADDER MUCOSA/ INSERTION RIGHT URETERAL STENT  . Port-a-cath removal    . Cystoscopy with retrograde pyelogram, ureteroscopy and stent placement Right 06/23/2013    Procedure: CYSTOSCOPY WITH RIGHT URETEROSCOPY, RIGHT  RETROGRADE PYELOGRAM, WITH LASER LIPOTRIPSY AND RIGHT URETERAL STENT EXCHANGE ;  Surgeon: Molli Hazard, MD;  Location: [facility];  Service: Urology;  Laterality: Right;  STENT EXCHANGE    . Holmium laser application N/A  [id number]    Procedure: HOLMIUM LASER APPLICATION;  Surgeon: Molli Hazard, MD;  Location: [facility];  Service: Urology;  Laterality: N/A;    Social History History  Substance Use Topics  . Smoking status: Former Smoker -- 0.25 packs/day for 1 years    Types: Cigarettes    Quit date: 11/28/1979  . Smokeless tobacco:  Never Used  . Alcohol Use: No    Family History Family History  Problem Relation Age of Onset  . Coronary artery disease Mother   . Hypertension Mother   . Diabetes type II Mother   . Malignant hyperthermia Mother   . Coronary artery disease Father   . Hypertension Father   . Malignant hyperthermia Father   . Cancer Maternal Grandfather     ? Type  . Kidney failure Maternal Grandmother     Allergies  Allergen Reactions  . Amoxicillin-Pot Clavulanate Diarrhea  . Rosiglitazone Maleate     REACTION: swelling  . Amoxicillin Rash    Tolerated Zosyn 01/2013.  [name]     REVIEW OF SYSTEMS  General: [ ]  Weight loss, [ ]  Fever, [ ]  chills Neurologic: [ ]  Dizziness, [ ]  Blackouts, [ ]  Seizure [ ]  Stroke, [ ]  "Mini stroke", [ ]  Slurred speech, [ ]  Temporary blindness; [ ]  weakness in arms or legs, [ ]  Hoarseness [ ]  Dysphagia Cardiac: [ ]  Chest pain/pressure, [x ] Shortness of breath at rest [ x] Shortness of breath with exertion, [ ]  Atrial fibrillation or irregular heartbeat  Vascular: [ ]  Pain in legs with walking, [ ]  Pain in legs at rest, [ ]  Pain in legs at night,  [ ]  Non-healing ulcer, [ ]  Blood clot in vein/DVT,   Pulmonary: [ ]  Home oxygen, [ ]  Productive cough, [ ]  Coughing up blood, [ ]  Asthma, [x]  oxygen in the hospital  [ ]  Wheezing [x ] COPD Musculoskeletal:  [ ]  Arthritis, [ ]  Low back pain, [ ]  Joint pain Hematologic: [ ]  Easy Bruising, [ ]  Anemia; [ ]  Hepatitis Gastrointestinal: [ ]  Blood in stool, [ ]  Gastroesophageal Reflux/heartburn, Urinary: [x]  chronic Kidney disease, [ ]  on HD - [ ]  MWF or [ ]  TTHS, [ ]  Burning with urination, [ ]   Difficulty urinating Skin: [ ]  Rashes, [ ]  Wounds Psychological: [ ]  Anxiety, [ ]  Depression  Physical Examination Filed Vitals:   07/21/13 2214 07/22/13 0641 07/22/13 0650 07/22/13 0958  BP: 116/74 112/68 108/69 137/82  Pulse: 58  57 60  Temp: 97.5 F (36.4 C)  97.4 F (36.3 C)   TempSrc: Oral  Oral   Resp: 22  23   Height:      Weight:   368 lb (166.924 kg)   SpO2: 94%  98% 98%   Body mass index is 58.44 kg/(m^2).  General:  WDWN in NAD HENT: WNL Eyes: Pupils equal Pulmonary: normal non-labored breathing  Cardiac: RRR,   Abdomen: soft,obese, NT,  Skin: no rashes, ulcers noted;  no Gangrene , no cellulitis; no open wounds;   Vascular Exam/Pulses: 3+ radial/ulnar/DP bilaterally Musculoskeletal: no muscle wasting or atrophy; no edema  Neurologic: A&O X 3; Appropriate Affect ;  SENSATION: normal;unable to hear MOTOR FUNCTION: 5/5 Symmetric Speech is fluent/normal   Significant Diagnostic Studies: CBC Lab Results  Component Value Date   WBC 13.6* 07/22/2013   HGB 11.4* 07/22/2013   HCT 38.4 07/22/2013   MCV 92.1 07/22/2013   PLT 213 07/22/2013    BMET    Component Value Date/Time   NA 144 07/22/2013 0500   K 4.3 07/22/2013 0500   CL 109 07/22/2013 0500   CO2 23 07/22/2013 0500   GLUCOSE 105* 07/22/2013 0500   BUN 74* 07/22/2013 0500   CREATININE 5.85* 07/22/2013 0500   CREATININE 1.90* 03/07/2012 1652   CALCIUM 8.9 07/22/2013 0500   GFRNONAA 8* 07/22/2013 0500  GFRAA 9* 07/22/2013 0500   Estimated Creatinine Clearance: 18.5 ml/min (by C-G formula based on Cr of 5.85).  COAG Lab Results  Component Value Date   INR 1.37 02/14/2013   INR 1.23 11/16/2012   INR 1.32 11/15/2012     Non-Invasive Vascular Imaging: 05/2012 Vein mapping shows adequate Cephalic vein bilat.  ASSESSMENT/PLAN: Sheridan Chukwu is a 52 y.o. female with acute on chronic renal failure requiring intermittent hemodialysis.  Pt may need more permanent access. May need to repeat vein  mapping   Laurence Slate [facility] 07/22/2013 1:13 PM  History and exam details as above.  I spoke with the patient and her mother.  Currently patient has not decided if she wants to proceed with dialysis.  Fistula would be best option in light of pts prior skin infections.  Although she is obese the upper extremities are fairly thin.  Vein map from 14 mo ago suggest upper arm cephalic vein would be usable but has had multiple hospital admissions and venipuncture since then so needs repeat mapping.  Will order today so that we have plan in place if pt wishes to proceed.  Please call us if she wishes to proceed during this hospital admission.  Ruta Hinds, MD [facility] [place] Office: [phone] Pager: [phone]   Addendum: Vein map reviewed 1.8 mm cephalic vein on left basilic small as well.  Will most likely need AV graft.   Ruta Hinds, MD [facility] [place] Office: [phone] Pager: [phone]

## 2013-07-25 NOTE — Progress Notes (Signed)
PT cancel note:    Pt off floor for procedure.  Will attempt back later today if time allows.    Sarajane Marek, [place] [phone] 07/25/2013

## 2013-07-25 NOTE — Progress Notes (Signed)
[facility] PROGRESS NOTE  [name] [id number] DOB: 1961/07/29 DOA: 07/16/2013 PCP: [name], MD  HPI/Subjective: There were no acute events overnight, patient refusing to sign consent for AV fistula placement today expressing wishes to speak with family members. I attempted to call of family however was unable to reach them this morning. Will try again in a while. Meanwhile patient has no complaints, remains hemodynamically stable. .  Assessment/Plan:  CHF, acute on chronic combined -Increased fluid overload, will likely be started on hemodialysis. -Continue Imdur, Bystolic and hydralazine. -Status post transthoracic echocardiogram performed on 07/17/2013 with LVEF of 50-55%, improved from 35% in 2011, has grade 2 diastolic dysfunction.  Acute on chronic renal failure  -Patient with a history of chronic kidney disease having a creatinine of 2.85, BUN of 40 on the seventh 18 2014. On not 06/30/2013 her creatinine increased to 5.01. She is on schedule for AV fistula placement today however refusing to sign consent this morning until speaking with family members. I will try to get ahold of them later on today.  VRE UTI -Urine culture grew VRE, patient was on vancomycin and switched to levofloxacin. Pharmacy consulted for Levaquin dosing. -Blood culture is sterile to date, patient remained afebrile, hemodynamically stable.   Diabetes mellitus type 2 -Controlled diabetes mellitus type 2 with hemoglobin A1c of 5.7, this indicates mean plasma glucose of 117. -Continue insulin sliding scale, blood sugars remain well controlled with chem 7 showing glucose in the low 100s consistently.  Hidradenitis suppurativa -Multiple areas of abscesses, with purulent discharge. There has been no change during this hospitalization. -Continue Levaquin therapy.  -Added Hibiclens for wound cleansing. -Patient was offered surgical consultation for her cellulitis/hydradenitis suppurativa,  patient declined.  Rectovaginal fistula -Referred to Dr. Harolyn Rutherford [facility] as outpatient.  OSA -Reported that she is not on CPAP at home, which will be ordered on discharge. Meanwhile continue CPAP at nighttime.  Code Status: Full code Family Communication: Plan discussed with patient Disposition Plan: Remains the patient.   Consultants:  [employer].  Procedures:  None  Antibiotics:  None   Objective: Filed Vitals:   07/25/13 0604  BP: 137/80  Pulse:   Temp:   Resp:     Intake/Output Summary (Last 24 hours) at 07/25/13 0727 Last data filed at 07/25/13 0411  Gross per 24 hour  Intake    846 ml  Output    650 ml  Net    196 ml   Filed Weights   07/21/13 0500 07/22/13 0650 07/24/13 0703  Weight: 165.79 kg (365 lb 8 oz) 166.924 kg (368 lb) 180.1 kg (397 lb 0.8 oz)    Exam: General: Alert and awake, oriented x3, not in any acute distress. Morbid obesity HEENT: anicteric sclera, pupils reactive to light and accommodation, EOMI CVS: S1-S2 clear, no murmur rubs or gallops Chest: clear to auscultation bilaterally, no wheezing, rales or rhonchi Abdomen: soft nontender, nondistended, normal bowel sounds, no organomegaly Extremities: +1 to +2 pedal edema bilaterally. Neuro: Cranial nerves II-XII intact, no focal neurological deficits Skin: Right axillary swelling noted no purulent discharge. There is right inguinal swelling erythema pain to palpation.  Data Reviewed: Basic Metabolic Panel:  Recent Labs Lab 07/19/13 0500 07/20/13 0715 07/21/13 0650 07/22/13 0500 07/23/13 0708 07/24/13 0421  NA 141 140 142 144 141 142  K 4.1 4.0 4.2 4.3 4.2 4.4  CL 108 106 108 109 106 108  CO2 21 22 23 23 23 23   GLUCOSE 120* 122* 105* 105* 95 108*  BUN 65* 68* 69* 74* 75* 76*  CREATININE 5.52* 5.60* 5.66* 5.85* 5.82* 6.01*  CALCIUM 8.5 9.0 9.2 8.9 8.9 9.0  MG 2.2 2.2 2.3 2.3 2.4 2.5  PHOS 4.8*  --  5.1* 4.8* 4.5 4.9*   Liver Function Tests:  Recent  Labs Lab 07/19/13 0500 07/20/13 0715 07/21/13 0650 07/22/13 0500 07/23/13 0708 07/24/13 0421  AST 7 7  --   --   --   --   ALT <5 <5  --   --   --   --   ALKPHOS 76 74  --   --   --   --   BILITOT 0.3 0.3  --   --   --   --   PROT 8.3 9.1*  --   --   --   --   ALBUMIN 2.3* 2.6* 2.4* 2.2* 2.2* 2.3*   No results found for this basename: LIPASE, AMYLASE,  in the last 168 hours No results found for this basename: AMMONIA,  in the last 168 hours CBC:  Recent Labs Lab 07/21/13 0650 07/22/13 0500 07/23/13 0708 07/24/13 0421 07/25/13 0600  WBC 12.0* 13.6* 12.7* 14.6* 14.6*  NEUTROABS 9.8* 11.2* 10.1* 11.8* 11.9*  HGB 11.6* 11.4* 11.3* 10.6* 11.1*  HCT 40.5 38.4 39.1 36.5 37.5  MCV 93.8 92.1 92.2 92.9 92.8  PLT 202 213 193 217 206   Cardiac Enzymes: No results found for this basename: CKTOTAL, CKMB, CKMBINDEX, TROPONINI,  in the last 168 hours BNP (last 3 results)  Recent Labs  08/16/12 2049 11/10/12 1724 07/16/13 1522  PROBNP 26806.0* 33908.0* 29655.0*   CBG:  Recent Labs Lab 07/23/13 2058 07/24/13 0602 07/24/13 1145 07/24/13 1637 07/24/13 2126  GLUCAP 148* 104* 122* 109* 128*    Recent Results (from the past 240 hour(s))  URINE CULTURE     Status: None   Collection Time    07/17/13 12:29 PM      Result Value Range Status   Specimen Description URINE, CATHETERIZED   Final   Special Requests NONE   Final   Culture  Setup Time     Final   Value: 07/17/2013 16:43     Performed at [place]     Final   Value: >=100,000 COLONIES/ML     Performed at [facility]   Culture     Final   Value: VANCOMYCIN RESISTANT ENTEROCOCCUS ISOLATED     Note: CRITICAL RESULT CALLED TO, READ BACK BY AND VERIFIED WITH: ALLEN T. @ 11:00 70M 8.25.14 BY DESIS     Performed at [facility]   Report Status 07/21/2013 FINAL   Final   Organism ID, Bacteria VANCOMYCIN RESISTANT ENTEROCOCCUS ISOLATED   Final  CULTURE, BLOOD (ROUTINE X 2)      Status: None   Collection Time    07/17/13  3:27 PM      Result Value Range Status   Specimen Description BLOOD LEFT HAND   Final   Special Requests BOTTLES DRAWN AEROBIC ONLY 10CC   Final   Culture  Setup Time     Final   Value: 07/17/2013 22:26     Performed at [facility]   Culture     Final   Value: NO GROWTH 5 DAYS     Performed at [facility]   Report Status 07/23/2013 FINAL   Final  CULTURE, BLOOD (ROUTINE X 2)     Status: None   Collection Time  07/17/13  3:33 PM      Result Value Range Status   Specimen Description BLOOD RIGHT HAND   Final   Special Requests BOTTLES DRAWN AEROBIC ONLY 5CC   Final   Culture  Setup Time     Final   Value: 07/17/2013 22:26     Performed at [facility]   Culture     Final   Value: NO GROWTH 5 DAYS     Performed at [facility]   Report Status 07/23/2013 FINAL   Final  URINE CULTURE     Status: None   Collection Time    07/18/13  8:31 AM      Result Value Range Status   Specimen Description URINE, CATHETERIZED   Final   Special Requests NONE   Final   Culture  Setup Time     Final   Value: 07/18/2013 13:49     Performed at [place]     Final   Value: 60,000 COLONIES/ML     Performed at [facility]   Culture     Final   Value: ENTEROCOCCUS SPECIES     Note: SUSCEPTIBILITIES PERFORMED ON PREVIOUS CULTURE WITHIN THE LAST 5 DAYS.     Performed at [facility]   Report Status 07/21/2013 FINAL   Final     Studies: No results found.  Scheduled Meds: . aspirin EC  81 mg Oral q morning - 10a  . calcitRIOL  0.25 mcg Oral Daily  . calcium carbonate  1 tablet Oral BID WC  . DULoxetine  60 mg Oral Daily  . famotidine  20 mg Oral QHS  . feeding supplement  1 Container Oral Q24H  . ferrous sulfate  325 mg Oral BID  . folic acid  1 mg Oral Daily  . furosemide  80 mg Oral BID  . heparin  5,000 Units Subcutaneous Q8H  . hydrALAZINE  25 mg Oral Q8H  . insulin  aspart  0-15 Units Subcutaneous TID WC  . insulin aspart  0-5 Units Subcutaneous QHS  . isosorbide mononitrate  30 mg Oral q morning - 10a  . levofloxacin  500 mg Oral Q48H  . levothyroxine  25 mcg Oral QAC breakfast  . magnesium oxide  400 mg Oral BID  . nebivolol  10 mg Oral BID  . saccharomyces boulardii  250 mg Oral BID  . senna-docusate  1 tablet Oral BID  . simvastatin  20 mg Oral q1800  . sodium bicarbonate  650 mg Oral BID  . sodium chloride  3 mL Intravenous Q12H  . zinc sulfate  220 mg Oral Daily   Continuous Infusions:   Principal Problem:   Diastolic CHF Active Problems:   Morbid obesity   GERD   DM type 2 (diabetes mellitus, type 2)   Hypertension   Renal failure (ARF), acute on chronic   SOB (shortness of breath)   OSA (obstructive sleep apnea)   UTI (urinary tract infection)    Time spent: 35 minutes    Kelvin Cellar  [facility] Pager [phone]. If 7PM-7AM, please contact night-coverage at [email or web address], password [facility] 07/25/2013, 7:27 AM  LOS: 9 days

## 2013-07-25 NOTE — Preoperative (Addendum)
Beta Blockers   Reason not to administer Beta Blockers: Not Applicable, Pt took Bystolic 123XX123 @ 123XX123, Will get this am dose post-op.

## 2013-07-25 NOTE — Progress Notes (Signed)
Pt.A/Ox4 and is maximum 2 to 3 person assist. She had no c/o pain during the shift and no signs of distress. Pt.had remained NPO since midnight except for sips of water with scheduled medications. Pt.had orders to obtain consent for procedure this am however when patient seen consent form and learned that Dr.Hardy would be doing the procedure she was concerned because she did know Dr.Hardy and that Dr.Powell was her primary doctor. Explained to her that Dr.Hardy would be the surgeon that would be doing her procedure but patient expressed that she was still not comfortable signing the consent. Oncoming nurse notified of patient refusing to sign consent. Also around 0400 this am pt.was asked by NT and RN if she would like a bath. Patient refused even though she was asked multiple times and was told that she had a procedure scheduled but she said "no". Later patient asked at  6:30am if she could have a bath but it was explained to her that it would be reported to day shift if there was no time to do it however night shift NT and day shift NT were able to give patient a bath this am.

## 2013-07-25 NOTE — Anesthesia Procedure Notes (Addendum)
Procedure Name: Intubation Date/Time: 07/25/2013 11:09 AM Performed by: Carney Living Pre-anesthesia Checklist: Patient identified, Emergency Drugs available, Suction available, Patient being monitored and Timeout performed Patient Re-evaluated:Patient Re-evaluated prior to inductionOxygen Delivery Method: Circle system utilized Preoxygenation: Pre-oxygenation with 100% oxygen Intubation Type: IV induction Laryngoscope Size: Mac and 4 Grade View: Grade I Tube type: Oral Tube size: 7.5 mm Number of attempts: 1 Airway Equipment and Method: Stylet Placement Confirmation: ETT inserted through vocal cords under direct vision,  positive ETCO2 and breath sounds checked- equal and bilateral Secured at: 22 cm Tube secured with: Tape Dental Injury: Teeth and Oropharynx as per pre-operative assessment     The patient was identified and consent obtained.  TO was performed, and full barrier precautions were used. The patient was in trendeleburge position and the Right IJ was attempted.  The skin was anesthetized with lidocaine.  Once the vein was located with the 22 ga., the wire was inserted into the vein. I was unable to pass the wire into the SVC far enough to allow CVL insertion.  I tried to pass the wire pass the obstruction several times, but was unsuccessful.  At this point, the procedure was aborted, and the drapes were removed.   Start: 1015 End: 1030 J. Tedra Senegal, MD

## 2013-07-25 NOTE — Transfer of Care (Signed)
Immediate Anesthesia Transfer of Care Note  Patient: [name]  Procedure(s) Performed: Procedure(s): ARTERIOVENOUS (AV) FISTULA CREATION  (Left)  Patient Location: PACU  Anesthesia Type:General  Level of Consciousness: awake, alert , oriented and patient cooperative  Airway & Oxygen Therapy: Patient Spontanous Breathing and Patient connected to face mask oxygen  Post-op Assessment: Report given to PACU RN, Post -op Vital signs reviewed and stable and Patient moving all extremities X 4  Post vital signs: Reviewed and stable  Complications: No apparent anesthesia complications

## 2013-07-25 NOTE — Progress Notes (Signed)
Pt on kin air bed

## 2013-07-25 NOTE — Progress Notes (Signed)
Patient refusing CPAP at this time. RT will continue to monitor.  

## 2013-07-25 NOTE — Progress Notes (Signed)
The patient is complaining of 8/10 pain on her buttocks and the site was bleeding some.  She states that she is able to sleep with pain medication.  The patient's dressing on her buttocks was changed and she was bathed.  Barrier cream was applied to her abdominal folds and under her breasts.

## 2013-07-25 NOTE — Telephone Encounter (Addendum)
Message copied by Gena Fray on Fri Jul 25, 2013  1:56 PM ------      Message from: Mena Goes      Created: Fri Jul 25, 2013 12:19 PM      Regarding: schedule                   ----- Message -----         From: Gabriel Earing, PA-C         Sent: 07/25/2013  12:07 PM           To: Mena Goes, CMA            S/p left brachiocephalic AVF [id number].  F/u with Dr. Donnetta Hutching in 4 weeks.            Thanks,      Samantha ------  07/25/13: lm for pt, dpm

## 2013-07-25 NOTE — Op Note (Signed)
OPERATIVE REPORT  DATE OF SURGERY: 07/25/2013  PATIENT: [name], 52 y.o. female MRN: [id number]  DOB: [birth year]  PRE-OPERATIVE DIAGNOSIS: CRI  POST-OPERATIVE DIAGNOSIS:  Same  PROCEDURE: Left upper arm av fistula  SURGEON:  Curt Jews, M.D.  PHYSICIAN ASSISTANT: Rhyne  ANESTHESIA:  GET  EBL: minimal ml  Total I/O In: 0  Out: 300 [Urine:300]  BLOOD ADMINISTERED: none  DRAINS: none  SPECIMEN: none  COUNTS CORRECT:  YES  PLAN OF CARE: PACU   PATIENT DISPOSITION:  PACU - hemodynamically stable  PROCEDURE DETAILS: The patient was taken to the operating room placed supine position where the area of the left arm was prepped and draped in usual sterile fashion SonoSite ultrasound was used to identify the cephalic vein. This was a moderate size throughout the forearm. A very small. At the antecubital space and upper arm it was patent throughout its course with moderate size. An incision was made at the antecubital space and carried down to isolate the brachial artery and the cephalic vein. The vein was mobilized and tributary branches were ligated and divided. The vein was ligated distally and divided. It was mobilized to the area of the radial artery. The brachial artery was occluded proximal and distally was opened with an 11 blade sludge in Potts scissors. The vein was sewn into side to the artery with a running 6-0 Prolene suture. Clamps removed and good thrill was noted. Wound irrigated with saline. Hemostasis tablet cautery. Wounds were closed with 3-0 Vicryl in the subcutaneous and subcuticular tissue. Benzoin stricture for applied   Curt Jews, M.D. 07/25/2013 12:27 PM

## 2013-07-25 NOTE — Interval H&P Note (Signed)
History and Physical Interval Note:  07/25/2013 10:10 AM  [name]  has presented today for surgery, with the diagnosis of ESRD  The various methods of treatment have been discussed with the patient and family. After consideration of risks, benefits and other options for treatment, the patient has consented to  Procedure(s): ARTERIOVENOUS (AV) FISTULA CREATION VS INSERTION GRAFT (Left) as a surgical intervention .  The patient's history has been reviewed, patient examined, no change in status, stable for surgery.  I have reviewed the patient's chart and labs.  Questions were answered to the patient's satisfaction.     Esteven Overfelt

## 2013-07-26 LAB — CBC WITH DIFFERENTIAL/PLATELET
Basophils Absolute: 0 10*3/uL (ref 0.0–0.1)
Eosinophils Absolute: 0.6 10*3/uL (ref 0.0–0.7)
Eosinophils Relative: 4 % (ref 0–5)
Hemoglobin: 11 g/dL — ABNORMAL LOW (ref 12.0–15.0)
Lymphocytes Relative: 7 % — ABNORMAL LOW (ref 12–46)
Lymphs Abs: 1 10*3/uL (ref 0.7–4.0)
MCH: 27.1 pg (ref 26.0–34.0)
MCV: 91.9 fL (ref 78.0–100.0)
Monocytes Relative: 7 % (ref 3–12)
Neutrophils Relative %: 82 % — ABNORMAL HIGH (ref 43–77)
RBC: 4.06 MIL/uL (ref 3.87–5.11)

## 2013-07-26 LAB — GLUCOSE, CAPILLARY
Glucose-Capillary: 118 mg/dL — ABNORMAL HIGH (ref 70–99)
Glucose-Capillary: 132 mg/dL — ABNORMAL HIGH (ref 70–99)
Glucose-Capillary: 140 mg/dL — ABNORMAL HIGH (ref 70–99)

## 2013-07-26 LAB — RENAL FUNCTION PANEL
BUN: 82 mg/dL — ABNORMAL HIGH (ref 6–23)
CO2: 20 mEq/L (ref 19–32)
GFR calc Af Amer: 8 mL/min — ABNORMAL LOW (ref >90)
Glucose, Bld: 125 mg/dL — ABNORMAL HIGH (ref 70–99)
Phosphorus: 5.5 mg/dL — ABNORMAL HIGH (ref 2.3–4.6)
Potassium: 4.7 mEq/L (ref 3.5–5.1)
Sodium: 138 mEq/L (ref 135–145)

## 2013-07-26 NOTE — Progress Notes (Signed)
[facility] PROGRESS NOTE  [name] [id number] DOB: May 15, 1961 DOA: 07/16/2013 PCP: [name], MD  HPI/Subjective: Mrs. Vice undergoing left upper extremity AV fistula placement yesterday, procedure performed by Dr. Donnetta Hutching of vascular surgery. Creatinine continues to trend up to 6.32 this morning, nephrology following, Lasix held for this morning. She has no new complaints today, tolerating by mouth intake.   Assessment/Plan:  CHF, acute on chronic combined -Increased fluid overload, will likely be started on hemodialysis. -Continue Imdur, Bystolic and hydralazine. -Status post transthoracic echocardiogram performed on 07/17/2013 with LVEF of 50-55%, improved from 35% in 2011, has grade 2 diastolic dysfunction.  Acute on chronic renal failure  -Creatinine continues to trend up to 6.3 on this mornings lab work. Her potassium and bicarbonate are within normal limits.  Nephrology following, recommending holding Lasix this morning. We'll follow.  VRE UTI -Urine culture grew VRE, patient was on vancomycin and switched to levofloxacin. Pharmacy consulted for Levaquin dosing. -Blood culture is sterile to date, patient remained afebrile, hemodynamically stable.   Diabetes mellitus type 2 -Controlled diabetes mellitus type 2 with hemoglobin A1c of 5.7, this indicates mean plasma glucose of 117. -Continue insulin sliding scale, blood sugars remain well controlled with chem 7 showing glucose in the low 100s consistently.  Hidradenitis suppurativa -Multiple areas of abscesses, with purulent discharge. There has been no change during this hospitalization. -Continue Levaquin therapy.  -Added Hibiclens for wound cleansing. -Patient was offered surgical consultation for her cellulitis/hydradenitis suppurativa, patient declined.  Rectovaginal fistula -Referred to Dr. Harolyn Rutherford [facility] as outpatient.  OSA -Reported that she is not on CPAP at home, which will be ordered on discharge.  Meanwhile continue CPAP at nighttime.  Code Status: Full code Family Communication: Plan discussed with patient Disposition Plan: Remains the patient.   Consultants:  [employer].  Procedures:  None  Antibiotics:  None   Objective: Filed Vitals:   07/26/13 0542  BP: 111/72  Pulse: 63  Temp: 97.1 F (36.2 C)  Resp: 19    Intake/Output Summary (Last 24 hours) at 07/26/13 1117 Last data filed at 07/26/13 0900  Gross per 24 hour  Intake    683 ml  Output    450 ml  Net    233 ml   Filed Weights   07/24/13 0703 07/25/13 0755 07/26/13 0542  Weight: 180.1 kg (397 lb 0.8 oz) 179.987 kg (396 lb 12.8 oz) 179.398 kg (395 lb 8 oz)    Exam: General: Alert and awake, oriented x3, not in any acute distress. Morbid obesity HEENT: anicteric sclera, pupils reactive to light and accommodation, EOMI CVS: S1-S2 clear, no murmur rubs or gallops Chest: clear to auscultation bilaterally, no wheezing, rales or rhonchi Abdomen: soft nontender, nondistended, normal bowel sounds, no organomegaly Extremities: +1 to +2 pedal edema bilaterally. Neuro: Cranial nerves II-XII intact, no focal neurological deficits Skin: Right axillary swelling noted no purulent discharge. There is right inguinal swelling erythema pain to palpation.  Data Reviewed: Basic Metabolic Panel:  Recent Labs Lab 07/22/13 0500 07/23/13 0708 07/24/13 0421 07/25/13 0600 07/26/13 0805  NA 144 141 142 141 138  K 4.3 4.2 4.4 4.7 4.7  CL 109 106 108 106 105  CO2 23 23 23 20 20   GLUCOSE 105* 95 108* 99 125*  BUN 74* 75* 76* 79* 82*  CREATININE 5.85* 5.82* 6.01* 6.00* 6.32*  CALCIUM 8.9 8.9 9.0 9.1 8.8  MG 2.3 2.4 2.5 2.6* 2.6*  PHOS 4.8* 4.5 4.9* 4.8* 5.5*   Liver Function Tests:  Recent Labs Lab 07/20/13 0715  07/22/13 0500 07/23/13 0708 07/24/13 0421 07/25/13 0600 07/26/13 0805  AST 7  --   --   --   --   --   --   ALT <5  --   --   --   --   --   --   ALKPHOS 74  --   --   --   --    --   --   BILITOT 0.3  --   --   --   --   --   --   PROT 9.1*  --   --   --   --   --   --   ALBUMIN 2.6*  < > 2.2* 2.2* 2.3* 2.4* 2.3*  < > = values in this interval not displayed. No results found for this basename: LIPASE, AMYLASE,  in the last 168 hours No results found for this basename: AMMONIA,  in the last 168 hours CBC:  Recent Labs Lab 07/22/13 0500 07/23/13 0708 07/24/13 0421 07/25/13 0600 07/26/13 0805  WBC 13.6* 12.7* 14.6* 14.6* 15.3*  NEUTROABS 11.2* 10.1* 11.8* 11.9* 12.6*  HGB 11.4* 11.3* 10.6* 11.1* 11.0*  HCT 38.4 39.1 36.5 37.5 37.3  MCV 92.1 92.2 92.9 92.8 91.9  PLT 213 193 217 206 247   Cardiac Enzymes: No results found for this basename: CKTOTAL, CKMB, CKMBINDEX, TROPONINI,  in the last 168 hours BNP (last 3 results)  Recent Labs  08/16/12 2049 11/10/12 1724 07/16/13 1522  PROBNP 26806.0* 33908.0* 29655.0*   CBG:  Recent Labs Lab 07/25/13 1242 07/25/13 1458 07/25/13 1617 07/25/13 2057 07/26/13 0631  GLUCAP 108* 117* 181* 189* 118*    Recent Results (from the past 240 hour(s))  URINE CULTURE     Status: None   Collection Time    07/17/13 12:29 PM      Result Value Range Status   Specimen Description URINE, CATHETERIZED   Final   Special Requests NONE   Final   Culture  Setup Time     Final   Value: 07/17/2013 16:43     Performed at [place]     Final   Value: >=100,000 COLONIES/ML     Performed at [facility]   Culture     Final   Value: VANCOMYCIN RESISTANT ENTEROCOCCUS ISOLATED     Note: CRITICAL RESULT CALLED TO, READ BACK BY AND VERIFIED WITH: ALLEN T. @ 11:00 6M 8.25.14 BY DESIS     Performed at [facility]   Report Status 07/21/2013 FINAL   Final   Organism ID, Bacteria VANCOMYCIN RESISTANT ENTEROCOCCUS ISOLATED   Final  CULTURE, BLOOD (ROUTINE X 2)     Status: None   Collection Time    07/17/13  3:27 PM      Result Value Range Status   Specimen Description BLOOD LEFT HAND    Final   Special Requests BOTTLES DRAWN AEROBIC ONLY 10CC   Final   Culture  Setup Time     Final   Value: 07/17/2013 22:26     Performed at [place]     Final   Value: NO GROWTH 5 DAYS     Performed at [facility]   Report Status 07/23/2013 FINAL   Final  CULTURE, BLOOD (ROUTINE X 2)     Status: None   Collection Time    07/17/13  3:33 PM  Result Value Range Status   Specimen Description BLOOD RIGHT HAND   Final   Special Requests BOTTLES DRAWN AEROBIC ONLY 5CC   Final   Culture  Setup Time     Final   Value: 07/17/2013 22:26     Performed at [facility]   Culture     Final   Value: NO GROWTH 5 DAYS     Performed at [facility]   Report Status 07/23/2013 FINAL   Final  URINE CULTURE     Status: None   Collection Time    07/18/13  8:31 AM      Result Value Range Status   Specimen Description URINE, CATHETERIZED   Final   Special Requests NONE   Final   Culture  Setup Time     Final   Value: 07/18/2013 13:49     Performed at [place]     Final   Value: 60,000 COLONIES/ML     Performed at [facility]   Culture     Final   Value: ENTEROCOCCUS SPECIES     Note: SUSCEPTIBILITIES PERFORMED ON PREVIOUS CULTURE WITHIN THE LAST 5 DAYS.     Performed at [facility]   Report Status 07/21/2013 FINAL   Final     Studies: No results found.  Scheduled Meds: . aspirin EC  81 mg Oral q morning - 10a  . calcitRIOL  0.25 mcg Oral Daily  . calcium carbonate  1 tablet Oral BID WC  . DULoxetine  60 mg Oral Daily  . famotidine  20 mg Oral QHS  . feeding supplement  1 Container Oral Q24H  . ferrous sulfate  325 mg Oral BID  . folic acid  1 mg Oral Daily  . heparin  5,000 Units Subcutaneous Q8H  . hydrALAZINE  25 mg Oral Q8H  . insulin aspart  0-15 Units Subcutaneous TID WC  . insulin aspart  0-5 Units Subcutaneous QHS  . isosorbide mononitrate  30 mg Oral q morning - 10a  . levofloxacin   500 mg Oral Q48H  . levothyroxine  25 mcg Oral QAC breakfast  . nebivolol  10 mg Oral BID  . saccharomyces boulardii  250 mg Oral BID  . senna-docusate  1 tablet Oral BID  . simvastatin  20 mg Oral q1800  . sodium chloride  3 mL Intravenous Q12H  . zinc sulfate  220 mg Oral Daily   Continuous Infusions:   Principal Problem:   Diastolic CHF Active Problems:   Morbid obesity   GERD   DM type 2 (diabetes mellitus, type 2)   Hypertension   Renal failure (ARF), acute on chronic   SOB (shortness of breath)   OSA (obstructive sleep apnea)   UTI (urinary tract infection)    Time spent: 35 minutes    Kelvin Cellar  [facility] Pager [phone]. If 7PM-7AM, please contact night-coverage at [email or web address], password [facility] 07/26/2013, 11:17 AM  LOS: 10 days

## 2013-07-26 NOTE — Progress Notes (Addendum)
  VASCULAR AND VEIN SURGERY PROGRESS NOTE  POST-OP HEMODIALYSIS ACCESS  Date of Surgery: 07/16/2013 - 07/25/2013 Surgeon: Juliann Mule): Shelley Posner, MD 1 Day Post-Op Left Procedure(s): ARTERIOVENOUS (AV) FISTULA CREATION    HPI: [name] is a 52 y.o. female who is 1 Day Post-Op creation/revision of left upper extremity Hemodialysis access. The patient denies symptoms of numbness, tingling, weakness; denies pain in the operative limb. C/O mild pain in the incision only  Significant Diagnostic Studies: CBC Lab Results  Component Value Date   WBC 15.3* 07/26/2013   HGB 11.0* 07/26/2013   HCT 37.3 07/26/2013   MCV 91.9 07/26/2013   PLT 247 07/26/2013   Vital Signs  BP Readings from Last 3 Encounters:  07/26/13 111/72  07/26/13 111/72  07/14/13 157/90   Temp Readings from Last 3 Encounters:  07/26/13 97.1 F (36.2 C) Oral  07/26/13 97.1 F (36.2 C) Oral  07/14/13 98.1 F (36.7 C) Oral   SpO2 Readings from Last 3 Encounters:  07/26/13 94%  07/26/13 94%  07/09/13 89%   Pulse Readings from Last 3 Encounters:  07/26/13 63  07/26/13 63  07/09/13 70     Physical Examination  left upper Incision is healing well, skin color is normal , hand grip is 5/5, sensation in digits is intact;  There is no thrill but good bruit in the left brachiocephalic AVF.  Assessment/Plan [name] is a 52 y.o. year old female who presents s/p creation/revision of left upper extremity Hemodialysis access. Cephalic vein is small. Fistula has a bruit but no thrill - will need to see if this matures No signs of steal Follow-up in 4 weeks  The patient's access will be ready for use in 12 weeks.  Forest J 07/26/2013 9:11 AM  Agree with above. Patient examined. Vascular will be available as needed.   Deitra Mayo, MD, [place] [phone] 07/26/2013

## 2013-07-26 NOTE — Progress Notes (Signed)
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 S/P LUE AVF 8/29, Dr. Donnetta Hutching  Volume overload  Plan: Stop magnesium, stop sodium bicarb to see if this helps volume, hold furosemide(given this am)     Subjective: Interval History: OK  Objective: Vital signs in last 24 hours: Temp:  [96.8 F (36 C)-98.9 F (37.2 C)] 97.1 F (36.2 C) (08/30 0542) Pulse Rate:  [61-82] 63 (08/30 0542) Resp:  [12-32] 19 (08/30 0542) BP: (111-145)/(49-76) 111/72 mmHg (08/30 0542) SpO2:  [91 %-99 %] 94 % (08/30 0542) Weight:  [179.398 kg (395 lb 8 oz)] 179.398 kg (395 lb 8 oz) (08/30 0542) Weight change: -0.113 kg (-4 oz)  Intake/Output from previous day: 08/29 0701 - 08/30 0700 In: 563 [I.V.:563] Out: 750 [Urine:750] Intake/Output this shift: Total I/O In: 120 [P.O.:120] Out: -   General appearance: alert and cooperative GI: soft, non-tender; bowel sounds normal; no masses,  no organomegaly and  Very obese Extremities: edema 2-3+ tight  Lab Results:  Recent Labs  07/25/13 0600 07/26/13 0805  WBC 14.6* 15.3*  HGB 11.1* 11.0*  HCT 37.5 37.3  PLT 206 247   BMET:  Recent Labs  07/25/13 0600 07/26/13 0805  NA 141 138  K 4.7 4.7  CL 106 105  CO2 20 20  GLUCOSE 99 125*  BUN 79* 82*  CREATININE 6.00* 6.32*  CALCIUM 9.1 8.8   No results found for this basename: PTH,  in the last 72 hours Iron Studies: No results found for this basename: IRON, TIBC, TRANSFERRIN, FERRITIN,  in the last 72 hours Studies/Results: No results found.  Scheduled: . aspirin EC  81 mg Oral q morning - 10a  . calcitRIOL  0.25 mcg Oral Daily  . calcium carbonate  1 tablet Oral BID WC  . DULoxetine  60 mg Oral Daily  . famotidine  20 mg Oral QHS  . feeding supplement  1 Container Oral Q24H  . ferrous sulfate  325 mg Oral BID   . folic acid  1 mg Oral Daily  . furosemide  80 mg Oral BID  . heparin  5,000 Units Subcutaneous Q8H  . hydrALAZINE  25 mg Oral Q8H  . insulin aspart  0-15 Units Subcutaneous TID WC  . insulin aspart  0-5 Units Subcutaneous QHS  . isosorbide mononitrate  30 mg Oral q morning - 10a  . levofloxacin  500 mg Oral Q48H  . levothyroxine  25 mcg Oral QAC breakfast  . magnesium oxide  400 mg Oral BID  . nebivolol  10 mg Oral BID  . saccharomyces boulardii  250 mg Oral BID  . senna-docusate  1 tablet Oral BID  . simvastatin  20 mg Oral q1800  . sodium bicarbonate  650 mg Oral BID  . sodium chloride  3 mL Intravenous Q12H  . zinc sulfate  220 mg Oral Daily    LOS: 10 days   Art Levan C 07/26/2013,10:42 AM

## 2013-07-26 NOTE — Progress Notes (Signed)
Tolerating being up in chair.  Site of fistula in left upper arm clean and dry.  Foley patent.  Wound care completed by husband.  Minimal bleeding noted, continues to have purulent drainage, although not as heavy.

## 2013-07-26 NOTE — Progress Notes (Signed)
ANTIBIOTIC CONSULT NOTE - FOLLOW UP  Pharmacy Consult for Levaquin Indication: VRE in Urine, Hidradenitis suppurativa   Allergies  Allergen Reactions  . Amoxicillin-Pot Clavulanate Diarrhea  . Rosiglitazone Maleate     REACTION: swelling  . Amoxicillin Rash    Tolerated Zosyn 01/2013.  [name]    Patient Measurements: Height: 5' 6.53" (169 cm) Weight: 395 lb 8 oz (179.398 kg) IBW/kg (Calculated) : 60.53  Vital Signs: Temp: 97.1 F (36.2 C) (08/30 0542) Temp src: Oral (08/30 0542) BP: 111/72 mmHg (08/30 0542) Pulse Rate: 63 (08/30 0542) Intake/Output from previous day: 08/29 0701 - 08/30 0700 In: 563 [I.V.:563] Out: 750 [Urine:750] Intake/Output from this shift: Total I/O In: 120 [P.O.:120] Out: -   Labs:  Recent Labs  07/24/13 0421 07/25/13 0600 07/26/13 0805  WBC 14.6* 14.6* 15.3*  HGB 10.6* 11.1* 11.0*  PLT 217 206 247  CREATININE 6.01* 6.00* 6.32*   Estimated Creatinine Clearance: 18 ml/min (by C-G formula based on Cr of 6.32). No results found for this basename: VANCOTROUGH, Corlis Leak, VANCORANDOM, [place], [place], [place], [place], [place], TOBRARND, AMIKACINPEAK, AMIKACINTROU, AMIKACIN,  in the last 72 hours   Microbiology: Recent Results (from the past 720 hour(s))  URINE CULTURE     Status: None   Collection Time    07/17/13 12:29 PM      Result Value Range Status   Specimen Description URINE, CATHETERIZED   Final   Special Requests NONE   Final   Culture  Setup Time     Final   Value: 07/17/2013 16:43     Performed at [place]     Final   Value: >=100,000 COLONIES/ML     Performed at [facility]   Culture     Final   Value: VANCOMYCIN RESISTANT ENTEROCOCCUS ISOLATED     Note: CRITICAL RESULT CALLED TO, READ BACK BY AND VERIFIED WITH: ALLEN T. @ 11:00 27M 8.25.14 BY DESIS     Performed at [facility]   Report Status 07/21/2013 FINAL   Final   Organism ID, Bacteria VANCOMYCIN RESISTANT  ENTEROCOCCUS ISOLATED   Final  CULTURE, BLOOD (ROUTINE X 2)     Status: None   Collection Time    07/17/13  3:27 PM      Result Value Range Status   Specimen Description BLOOD LEFT HAND   Final   Special Requests BOTTLES DRAWN AEROBIC ONLY 10CC   Final   Culture  Setup Time     Final   Value: 07/17/2013 22:26     Performed at [facility]   Culture     Final   Value: NO GROWTH 5 DAYS     Performed at [facility]   Report Status 07/23/2013 FINAL   Final  CULTURE, BLOOD (ROUTINE X 2)     Status: None   Collection Time    07/17/13  3:33 PM      Result Value Range Status   Specimen Description BLOOD RIGHT HAND   Final   Special Requests BOTTLES DRAWN AEROBIC ONLY 5CC   Final   Culture  Setup Time     Final   Value: 07/17/2013 22:26     Performed at [facility]   Culture     Final   Value: NO GROWTH 5 DAYS     Performed at [facility]   Report Status 07/23/2013 FINAL   Final  URINE CULTURE     Status: None  Collection Time    07/18/13  8:31 AM      Result Value Range Status   Specimen Description URINE, CATHETERIZED   Final   Special Requests NONE   Final   Culture  Setup Time     Final   Value: 07/18/2013 13:49     Performed at [facility]     Final   Value: 60,000 COLONIES/ML     Performed at [facility]   Culture     Final   Value: ENTEROCOCCUS SPECIES     Note: SUSCEPTIBILITIES PERFORMED ON PREVIOUS CULTURE WITHIN THE LAST 5 DAYS.     Performed at [facility]   Report Status 07/21/2013 FINAL   Final    Anti-infectives   Start     Dose/Rate Route Frequency Ordered Stop   07/25/13 0500  vancomycin (VANCOCIN) IVPB 1000 mg/200 mL premix     1,000 mg 200 mL/hr over 60 Minutes Intravenous To Surgery 07/24/13 1345 07/25/13 0657   07/23/13 1600  levofloxacin (LEVAQUIN) tablet 500 mg     500 mg Oral Every 48 hours 07/23/13 0820     07/21/13 1600  levofloxacin (LEVAQUIN) tablet 750 mg  Status:   Discontinued     750 mg Oral Every 48 hours 07/21/13 1416 07/23/13 0820   07/19/13 1200  vancomycin (VANCOCIN) 1,500 mg in sodium chloride 0.9 % 500 mL IVPB  Status:  Discontinued     1,500 mg 250 mL/hr over 120 Minutes Intravenous Every 48 hours 07/19/13 1122 07/21/13 1410   07/18/13 1000  ciprofloxacin (CIPRO) IVPB 400 mg  Status:  Discontinued     400 mg 200 mL/hr over 60 Minutes Intravenous Every 24 hours 07/18/13 0837 07/19/13 1055      Assessment: 52 year old female on day 6 Levaquin for a VRE UTI and hidradenitis suppurativa. She has acute on chronic renal failure and LUE AVF was placed for future plans of HD.   Cipro 8/22>> 8/23 Vanc 8/23>>8/25 Levaquin 8/25>>  8/21 BCx2 - NGTD 8/21 UCx - >100K VRE (S-Levaquin)  Plan:  Continue Levaquin 500 mg PO q48h Recommend length of therapy 7 to 14 days to minimize risk of C diff with fluoroquinolone use Will monitor renal function and adjust as needed  [facility], Pharm.D., BCPS Clinical Pharmacist Pager: [phone] 07/26/2013 11:34 AM

## 2013-07-26 NOTE — Progress Notes (Signed)
The patient was cleaned again this morning and Foley care was performed.  A moderate amount of blood was found on the bed pads.

## 2013-07-26 NOTE — Progress Notes (Signed)
Physical Therapy Treatment Patient Details Name: [name] MRN: [id number] DOB: [birth year] Today's Date: 07/26/2013 Time: [id number] PT Time Calculation (min): 43 min  PT Assessment / Plan / Recommendation  History of Present Illness [name] is an 52 y.o. female with multiple medical problems including DM, morbid obesity, asthma, CHF (Epic said systolic, but EF 60 percent, so likely diastolic), CKD 3, Hypothyroidism presents to the ERWith 3 days hx of increase shortness of breath, chest tightness, and some orthopnea.  She was having some wheezing, and had seen her pulmonologist, who tx her for possible GERD.  She denied fever, chills, or coughs.  But  Admitted to have some orthopnea.  Her legs have been swelling more, but she has no calf tenderness.  Work up in the ER included a CXR which showed cardiomegaly and borderline CHF.  Her WBC was 14K, and her Cr is 4.4.She has multiple peri area wounds that are draining.   PT Comments   Pt admitted with multiple medical problems. Pt currently with functional limitations due to prolonged bedrest and weakness as well as pt's girth limiting her ability to be mobile.  Pt's husband and mother present and encouraging pt to work with PT so that she does not have to do therapy at [facility].  Pt tried but continues to be limited by weakness.  Husband present and states that as long as we can get a hospital bed and HHPT that he and her mother can take care of pt at home.  They have all other necessary equipment.    Pt will benefit from skilled PT to increase their independence and safety with mobility to allow discharge to the venue listed below.   Follow Up Recommendations  Home health PT;Supervision/Assistance - 24 hour                 Equipment Recommendations  Other (comment) (hospital bed)        Frequency Min 3X/week   Progress towards PT Goals Progress towards PT goals: Progressing toward goals  Plan Discharge plan needs to be updated     Precautions / Restrictions Precautions Precautions: Fall Precaution Comments: morbidly obese Restrictions Weight Bearing Restrictions: No   Pertinent Vitals/Pain VSS, Pain all over per pt.    Mobility  Bed Mobility Bed Mobility: Supine to Sit;Sitting - Scoot to [employer] of Bed Rolling Right: 1: +2 Total assist;With rail Rolling Right: Patient Percentage: 30% Rolling Left: Not tested (comment) Right Sidelying to Sit: 1: +2 Total assist;With rails;HOB elevated Right Sidelying to Sit: Patient Percentage: 30% Supine to Sit: 1: +2 Total assist Supine to Sit: Patient Percentage: 30% Sitting - Scoot to Edge of Bed: 1: +2 Total assist Sitting - Scoot to Edge of Bed: Patient Percentage: 40% Details for Bed Mobility Assistance: +2 assist with all bed mobility due to panus.  Pt took incr time to come to EOB even with +2 total assist.   Transfers Transfers: Sit to Stand;Stand to Sit;Stand Pivot Transfers Sit to Stand: 1: +2 Total assist;With upper extremity assist;From bed Sit to Stand: Patient Percentage: 70% Stand to Sit: 1: +2 Total assist;With upper extremity assist;With armrests;To chair/3-in-1 Stand to Sit: Patient Percentage: 70% Details for Transfer Assistance: due to body habitus pt unable to push to standing with hands starting on bed.  Pulls herself to standing with hands on RW.   Ambulation/Gait Ambulation/Gait Assistance: 4: Min assist Ambulation Distance (Feet): 6 Feet Assistive device: Rolling walker Ambulation/Gait Assistance Details: Pt ambulated with RW with ability to steer  RW and sequence steps.  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 Pt stated she was tired after 6 feet and tech brought chair to pt.  Pt rested in chair and talked with pt and husband about home versus NH for therapy.  Pt and husband feel that husband and mother can care for pt at home if they get a  hospital bed.  Husband and PT encouraged pt to continue to make every attempt to get up daily when asked by PT and nursing.  Spoke with nursing who will help pt ambulate and get back to bed in a few hours.   Gait Pattern: Decreased stride length;Step-to pattern;Decreased step length - right;Decreased step length - left;Right flexed knee in stance;Left flexed knee in stance;Shuffle;Trunk flexed;Wide base of support Gait velocity: decreased Stairs: No Wheelchair Mobility Wheelchair Mobility: No    PT Goals (current goals can now be found in the care plan section)    Visit Information  Last PT Received On: 07/26/13 Assistance Needed: +2 History of Present Illness: [name] is an 52 y.o. female with multiple medical problems including DM, morbid obesity, asthma, CHF (Epic said systolic, but EF 60 percent, so likely diastolic), CKD 3, Hypothyroidism presents to the ERWith 3 days hx of increase shortness of breath, chest tightness, and some orthopnea.  She was having some wheezing, and had seen her pulmonologist, who tx her for possible GERD.  She denied fever, chills, or coughs.  But  Admitted to have some orthopnea.  Her legs have been swelling more, but she has no calf tenderness.  Work up in the ER included a CXR which showed cardiomegaly and borderline CHF.  Her WBC was 14K, and her Cr is 4.4.She has multiple peri area wounds that are draining.    Subjective Data  Subjective: "I just don't want to go to a rest home."   Cognition  Cognition Arousal/Alertness: Awake/alert Behavior During Therapy: WFL for tasks assessed/performed Overall Cognitive Status: Within Functional Limits for tasks assessed    Balance  Balance Balance Assessed: Yes Static Standing Balance Static Standing - Balance Support: Bilateral upper extremity supported;During functional activity Static Standing - Level of Assistance: 5: Stand by assistance Static Standing - Comment/# of Minutes: 2 min with RW  End of  Session PT - End of Session Equipment Utilized During Treatment: Gait belt Activity Tolerance: Patient limited by pain;Patient limited by fatigue Patient left: in chair;with call bell/phone within reach;with family/visitor present Nurse Communication: Mobility status;Need for lift equipment        INGOLD,Jovanie Verge 07/26/2013, 3:39 PM [facility] [phone] (pager)

## 2013-07-27 LAB — GLUCOSE, CAPILLARY
Glucose-Capillary: 109 mg/dL — ABNORMAL HIGH (ref 70–99)
Glucose-Capillary: 158 mg/dL — ABNORMAL HIGH (ref 70–99)

## 2013-07-27 LAB — MAGNESIUM: Magnesium: 2.6 mg/dL — ABNORMAL HIGH (ref 1.5–2.5)

## 2013-07-27 LAB — RENAL FUNCTION PANEL
BUN: 86 mg/dL — ABNORMAL HIGH (ref 6–23)
CO2: 22 mEq/L (ref 19–32)
Calcium: 8.8 mg/dL (ref 8.4–10.5)
GFR calc Af Amer: 8 mL/min — ABNORMAL LOW (ref >90)
Glucose, Bld: 117 mg/dL — ABNORMAL HIGH (ref 70–99)
Phosphorus: 5.4 mg/dL — ABNORMAL HIGH (ref 2.3–4.6)
Sodium: 139 mEq/L (ref 135–145)

## 2013-07-27 LAB — CBC WITH DIFFERENTIAL/PLATELET
Basophils Absolute: 0 10*3/uL (ref 0.0–0.1)
Basophils Relative: 0 % (ref 0–1)
Eosinophils Absolute: 0.8 10*3/uL — ABNORMAL HIGH (ref 0.0–0.7)
Eosinophils Relative: 5 % (ref 0–5)
HCT: 34.9 % — ABNORMAL LOW (ref 36.0–46.0)
Hemoglobin: 10.1 g/dL — ABNORMAL LOW (ref 12.0–15.0)
MCH: 26.6 pg (ref 26.0–34.0)
MCHC: 28.9 g/dL — ABNORMAL LOW (ref 30.0–36.0)
MCV: 92.1 fL (ref 78.0–100.0)
Monocytes Absolute: 0.7 10*3/uL (ref 0.1–1.0)
Monocytes Relative: 5 % (ref 3–12)
Neutro Abs: 11.5 10*3/uL — ABNORMAL HIGH (ref 1.7–7.7)
RDW: 18.5 % — ABNORMAL HIGH (ref 11.5–15.5)

## 2013-07-27 MED ORDER — FUROSEMIDE 10 MG/ML IJ SOLN
160.0000 mg | Freq: Three times a day (TID) | INTRAVENOUS | Status: DC
  Administered 2013-07-27 – 2013-07-29 (×6): 160 mg via INTRAVENOUS
  Filled 2013-07-27 (×8): qty 16

## 2013-07-27 NOTE — Progress Notes (Signed)
[facility] PROGRESS NOTE  [name] [id number] DOB: November 03, 1961 DOA: 07/16/2013 PCP: [name], MD  HPI/Subjective: [name] undergoing left upper extremity AV fistula placement, procedure performed by Dr. Donnetta Hutching of vascular surgery. Creatinine continues to trend up to 6.62 this morning, nephrology following. She has no new complaints today, tolerating by mouth intake.   Assessment/Plan:  CHF, acute on chronic combined -Increased fluid overload, will likely be started on hemodialysis. -Continue Imdur, Bystolic and hydralazine. -Status post transthoracic echocardiogram performed on 07/17/2013 with LVEF of 50-55%, improved from 35% in 2011, has grade 2 diastolic dysfunction.  Acute on chronic renal failure  -Creatinine continues to trend up to 6.6 on this mornings lab work. Her potassium and bicarbonate are within normal limits.  Nephrology following, recommending holding Lasix this morning. We'll follow.  VRE UTI -Urine culture grew VRE, patient was on vancomycin and switched to levofloxacin. Pharmacy consulted for Levaquin dosing. -Blood culture is sterile to date, patient remained afebrile, hemodynamically stable.   Diabetes mellitus type 2 -Controlled diabetes mellitus type 2 with hemoglobin A1c of 5.7, this indicates mean plasma glucose of 117. -Continue insulin sliding scale, blood sugars remain well controlled with chem 7 showing glucose in the low 100s consistently.  Hidradenitis suppurativa -Multiple areas of abscesses, with purulent discharge. There has been no change during this hospitalization. -Continue Levaquin therapy.  -Added Hibiclens for wound cleansing. -Patient was offered surgical consultation for her cellulitis/hydradenitis suppurativa, patient declined.  Rectovaginal fistula -Referred to Dr. Harolyn Rutherford OB/GY[facility] as outpatient.  OSA -Reported that she is not on CPAP at home, which will be ordered on discharge. Meanwhile continue CPAP at  nighttime.  Code Status: Full code Family Communication: Plan discussed with patient Disposition Plan: Remains the patient.   Consultants:  [employer].  Procedures:  None  Antibiotics:  None   Objective: Filed Vitals:   07/27/13 1544  BP: 119/69  Pulse: 60  Temp: 97 F (36.1 C)  Resp: 21    Intake/Output Summary (Last 24 hours) at 07/27/13 1819 Last data filed at 07/27/13 1548  Gross per 24 hour  Intake    560 ml  Output    326 ml  Net    234 ml   Filed Weights   07/25/13 0755 07/26/13 0542 07/27/13 0513  Weight: 179.987 kg (396 lb 12.8 oz) 179.398 kg (395 lb 8 oz) 182.618 kg (402 lb 9.6 oz)    Exam: General: Alert and awake, oriented x3, not in any acute distress. Morbid obesity HEENT: anicteric sclera, pupils reactive to light and accommodation, EOMI CVS: S1-S2 clear, no murmur rubs or gallops Chest: clear to auscultation bilaterally, no wheezing, rales or rhonchi Abdomen: soft nontender, nondistended, normal bowel sounds, no organomegaly Extremities: +1 to +2 pedal edema bilaterally. Neuro: Cranial nerves II-XII intact, no focal neurological deficits Skin: Right axillary swelling noted no purulent discharge. There is right inguinal swelling erythema pain to palpation.  Data Reviewed: Basic Metabolic Panel:  Recent Labs Lab 07/23/13 0708 07/24/13 0421 07/25/13 0600 07/26/13 0805 07/27/13 0535  NA 141 142 141 138 139  K 4.2 4.4 4.7 4.7 4.6  CL 106 108 106 105 104  CO2 23 23 20 20 22   GLUCOSE 95 108* 99 125* 117*  BUN 75* 76* 79* 82* 86*  CREATININE 5.82* 6.01* 6.00* 6.32* 6.62*  CALCIUM 8.9 9.0 9.1 8.8 8.8  MG 2.4 2.5 2.6* 2.6* 2.6*  PHOS 4.5 4.9* 4.8* 5.5* 5.4*   Liver Function Tests:  Recent Labs Lab 07/23/13 0708 07/24/13  0421 07/25/13 0600 07/26/13 0805 07/27/13 0535  ALBUMIN 2.2* 2.3* 2.4* 2.3* 2.2*   No results found for this basename: LIPASE, AMYLASE,  in the last 168 hours No results found for this basename:  AMMONIA,  in the last 168 hours CBC:  Recent Labs Lab 07/23/13 0708 07/24/13 0421 07/25/13 0600 07/26/13 0805 07/27/13 0535  WBC 12.7* 14.6* 14.6* 15.3* 14.1*  NEUTROABS 10.1* 11.8* 11.9* 12.6* 11.5*  HGB 11.3* 10.6* 11.1* 11.0* 10.1*  HCT 39.1 36.5 37.5 37.3 34.9*  MCV 92.2 92.9 92.8 91.9 92.1  PLT 193 217 206 247 197   Cardiac Enzymes: No results found for this basename: CKTOTAL, CKMB, CKMBINDEX, TROPONINI,  in the last 168 hours BNP (last 3 results)  Recent Labs  08/16/12 2049 11/10/12 1724 07/16/13 1522  PROBNP 26806.0* 33908.0* 29655.0*   CBG:  Recent Labs Lab 07/26/13 1634 07/26/13 2140 07/27/13 0603 07/27/13 1116 07/27/13 1544  GLUCAP 140* 137* 114* 109* 158*    Recent Results (from the past 240 hour(s))  URINE CULTURE     Status: None   Collection Time    07/18/13  8:31 AM      Result Value Range Status   Specimen Description URINE, CATHETERIZED   Final   Special Requests NONE   Final   Culture  Setup Time     Final   Value: 07/18/2013 13:49     Performed at [place]     Final   Value: 60,000 COLONIES/ML     Performed at [facility]   Culture     Final   Value: ENTEROCOCCUS SPECIES     Note: SUSCEPTIBILITIES PERFORMED ON PREVIOUS CULTURE WITHIN THE LAST 5 DAYS.     Performed at [facility]   Report Status 07/21/2013 FINAL   Final     Studies: No results found.  Scheduled Meds: . aspirin EC  81 mg Oral q morning - 10a  . calcitRIOL  0.25 mcg Oral Daily  . calcium carbonate  1 tablet Oral BID WC  . DULoxetine  60 mg Oral Daily  . famotidine  20 mg Oral QHS  . feeding supplement  1 Container Oral Q24H  . ferrous sulfate  325 mg Oral BID  . folic acid  1 mg Oral Daily  . furosemide  160 mg Intravenous Q8H  . heparin  5,000 Units Subcutaneous Q8H  . hydrALAZINE  25 mg Oral Q8H  . insulin aspart  0-15 Units Subcutaneous TID WC  . insulin aspart  0-5 Units Subcutaneous QHS  . isosorbide  mononitrate  30 mg Oral q morning - 10a  . levofloxacin  500 mg Oral Q48H  . levothyroxine  25 mcg Oral QAC breakfast  . nebivolol  10 mg Oral BID  . saccharomyces boulardii  250 mg Oral BID  . senna-docusate  1 tablet Oral BID  . simvastatin  20 mg Oral q1800  . sodium chloride  3 mL Intravenous Q12H  . zinc sulfate  220 mg Oral Daily   Continuous Infusions:   Principal Problem:   Diastolic CHF Active Problems:   Morbid obesity   GERD   DM type 2 (diabetes mellitus, type 2)   Hypertension   Renal failure (ARF), acute on chronic   SOB (shortness of breath)   OSA (obstructive sleep apnea)   UTI (urinary tract infection)    Time spent: 35 minutes    Kelvin Cellar  [facility] Pager [phone]. If 7PM-7AM, please  contact night-coverage at [email or web address], password [email or web address] 07/27/2013, 6:19 PM  LOS: 11 days

## 2013-07-27 NOTE — Progress Notes (Signed)
Renal Service Daily Progress Note [facility]  Subjective: No new complaints, creat up 6.6 today  Physical Exam:  Blood pressure 111/69, pulse 76, temperature 97.7 F (36.5 C), temperature source Oral, resp. rate 22, height 5' 6.54" (1.69 m), weight 182.618 kg (402 lb 9.6 oz), SpO2 96.00%. Gen: alert and cooperative Neck: no JVD noted Chest: clear bilat  Ext: edema 2-3+ LE's bilat  Assessment: 1 Acute worsening on chronic kidney disease, likely multifactorial cardiorenal, hx of GU obstruction due to stones with h/o GU stents; creatinine is rising, UOP dropped off, she is not grossly uremic but will be soon if no improvement. Will try high dose iv lasix 24-48hrs, and if this doesn't work will likely need tunneled cath and initiation of HD.  Explained to pt who is upset about possibility of having to have another procedure done 2 S/P LUE AVF 8/29, Dr. Donnetta Hutching  3 Volume overload, worse  Plan- resume lasix, IV high dose  Kelly Splinter  MD Pager [phone]    Cell  [phone] 07/27/2013, 11:37 AM    Recent Labs Lab 07/25/13 0600 07/26/13 0805 07/27/13 0535  NA 141 138 139  K 4.7 4.7 4.6  CL 106 105 104  CO2 20 20 22   GLUCOSE 99 125* 117*  BUN 79* 82* 86*  CREATININE 6.00* 6.32* 6.62*  CALCIUM 9.1 8.8 8.8  PHOS 4.8* 5.5* 5.4*    Recent Labs Lab 07/25/13 0600 07/26/13 0805 07/27/13 0535  ALBUMIN 2.4* 2.3* 2.2*    Recent Labs Lab 07/25/13 0600 07/26/13 0805 07/27/13 0535  WBC 14.6* 15.3* 14.1*  NEUTROABS 11.9* 12.6* 11.5*  HGB 11.1* 11.0* 10.1*  HCT 37.5 37.3 34.9*  MCV 92.8 91.9 92.1  PLT 206 247 197   . aspirin EC  81 mg Oral q morning - 10a  . calcitRIOL  0.25 mcg Oral Daily  . calcium carbonate  1 tablet Oral BID WC  . DULoxetine  60 mg Oral Daily  . famotidine  20 mg Oral QHS  . feeding supplement  1 Container Oral Q24H  . ferrous sulfate  325 mg Oral BID  . folic acid  1 mg Oral Daily  . heparin  5,000 Units Subcutaneous Q8H  .  hydrALAZINE  25 mg Oral Q8H  . insulin aspart  0-15 Units Subcutaneous TID WC  . insulin aspart  0-5 Units Subcutaneous QHS  . isosorbide mononitrate  30 mg Oral q morning - 10a  . levofloxacin  500 mg Oral Q48H  . levothyroxine  25 mcg Oral QAC breakfast  . nebivolol  10 mg Oral BID  . saccharomyces boulardii  250 mg Oral BID  . senna-docusate  1 tablet Oral BID  . simvastatin  20 mg Oral q1800  . sodium chloride  3 mL Intravenous Q12H  . zinc sulfate  220 mg Oral Daily     chlorhexidine, cyclobenzaprine, morphine injection, oxyCODONE-acetaminophen

## 2013-07-27 NOTE — Progress Notes (Signed)
Wound care. Pt washed with hibiclens and rinsed well from peri area to thighs and buttocks. Skin folds on ABD also. Placed Allevyn foam pads to areas that are open or oozing x 3.

## 2013-07-28 LAB — RENAL FUNCTION PANEL
Albumin: 2.1 g/dL — ABNORMAL LOW (ref 3.5–5.2)
CO2: 22 mEq/L (ref 19–32)
Chloride: 105 mEq/L (ref 96–112)
GFR calc Af Amer: 7 mL/min — ABNORMAL LOW (ref >90)
GFR calc non Af Amer: 6 mL/min — ABNORMAL LOW (ref >90)
Potassium: 4.6 mEq/L (ref 3.5–5.1)

## 2013-07-28 LAB — GLUCOSE, CAPILLARY
Glucose-Capillary: 114 mg/dL — ABNORMAL HIGH (ref 70–99)
Glucose-Capillary: 130 mg/dL — ABNORMAL HIGH (ref 70–99)

## 2013-07-28 LAB — CBC WITH DIFFERENTIAL/PLATELET
Basophils Absolute: 0 10*3/uL (ref 0.0–0.1)
Basophils Relative: 0 % (ref 0–1)
Hemoglobin: 10.2 g/dL — ABNORMAL LOW (ref 12.0–15.0)
Lymphocytes Relative: 8 % — ABNORMAL LOW (ref 12–46)
MCHC: 29.3 g/dL — ABNORMAL LOW (ref 30.0–36.0)
Monocytes Relative: 5 % (ref 3–12)
Neutro Abs: 12 10*3/uL — ABNORMAL HIGH (ref 1.7–7.7)
Neutrophils Relative %: 82 % — ABNORMAL HIGH (ref 43–77)
RDW: 18.4 % — ABNORMAL HIGH (ref 11.5–15.5)
WBC: 14.7 10*3/uL — ABNORMAL HIGH (ref 4.0–10.5)

## 2013-07-28 LAB — IRON AND TIBC: Iron: 23 ug/dL — ABNORMAL LOW (ref 42–135)

## 2013-07-28 NOTE — Progress Notes (Signed)
Subjective: Interval History: has complaints , appetite ok,no N, V, or SOB.  Objective: Vital signs in last 24 hours: Temp:  [97 F (36.1 C)-97.8 F (36.6 C)] 97.5 F (36.4 C) (09/01 0444) Pulse Rate:  [58-64] 58 (09/01 0444) Resp:  [20-22] 22 (09/01 0444) BP: (119-142)/(69-82) 142/82 mmHg (09/01 0444) SpO2:  [93 %-98 %] 93 % (09/01 0444) Weight:  [183.798 kg (405 lb 3.2 oz)] 183.798 kg (405 lb 3.2 oz) (09/01 0536) Weight change: 1.179 kg (2 lb 9.6 oz)  Intake/Output from previous day: 08/31 0701 - 09/01 0700 In: 1026 [P.O.:860; IV Piggyback:166] Out: 1325 [Urine:1325] Intake/Output this shift:    General appearance: alert, cooperative and morbidly obese Resp: diminished breath sounds bilaterally Cardio: S1, S2 normal and systolic murmur: holosystolic 2/6, blowing at apex GI: massive, pos bs, liver down 6 cm Extremities: edema 3+, and AVF B&T LUA  Lab Results:  Recent Labs  07/27/13 0535 07/28/13 0450  WBC 14.1* 14.7*  HGB 10.1* 10.2*  HCT 34.9* 34.8*  PLT 197 190   BMET:  Recent Labs  07/27/13 0535 07/28/13 0450  NA 139 139  K 4.6 4.6  CL 104 105  CO2 22 22  GLUCOSE 117* 113*  BUN 86* 92*  CREATININE 6.62* 6.77*  CALCIUM 8.8 8.8   No results found for this basename: PTH,  in the last 72 hours Iron Studies: No results found for this basename: IRON, TIBC, TRANSFERRIN, FERRITIN,  in the last 72 hours  Studies/Results: No results found.  I have reviewed the patient's current medications.  Assessment/Plan: 1 CKD minimal change Cr, nonoliguric and neg balance. Cont current tx 2 Anemia check Fe 3 HPTH f/u 4 Morbid obesity 5 Hydradentitis 6 DM P Lasix, Check Fe, follow Cr.    LOS: 12 days   Liboria Putnam L 07/28/2013,8:34 AM

## 2013-07-28 NOTE — Progress Notes (Signed)
Physical Therapy Treatment Patient Details Name: [name] MRN: [id number] DOB: 12-20-1960 Today's Date: 07/28/2013 Time: [id number] PT Time Calculation (min): 28 min  PT Assessment / Plan / Recommendation  History of Present Illness [name] is an 52 y.o. female with multiple medical problems including DM, morbid obesity, asthma, CHF (Epic said systolic, but EF 60 percent, so likely diastolic), CKD 3, Hypothyroidism presents to the ERWith 3 days hx of increase shortness of breath, chest tightness, and some orthopnea.  She was having some wheezing, and had seen her pulmonologist, who tx her for possible GERD.  She denied fever, chills, or coughs.  But  Admitted to have some orthopnea.  Her legs have been swelling more, but she has no calf tenderness.  Work up in the ER included a CXR which showed cardiomegaly and borderline CHF.  Her WBC was 14K, and her Cr is 4.4.She has multiple peri area wounds that are draining.   PT Comments   Pt able to ambulate ~10' x 2 today.  Cont's to be limited by pain, generalized weakness, decreased activity tolerance.  Pt has significant difficulty with be mobility at this date.  Cont to feel pt would best benefit from SNF at d/c but pt adamant about returning home with husbands assistance.  Will cont with current POC to ensure safe d/c home.    **Pt left in recliner at end of session.  Nsing made this therapist aware that pt was requesting to return back to bed after only 10 mins of sitting up.  Discussed with pt importance of being OOB & encouraged pt to sit in chair at least an hr**   Follow Up Recommendations  Home health PT;Supervision/Assistance - 24 hour     Does the patient have the potential to tolerate intense rehabilitation     Barriers to Discharge        Equipment Recommendations  Other (comment) (hospital bed)    Recommendations for Other Services    Frequency Min 3X/week   Progress towards PT Goals Progress towards PT goals: Progressing  toward goals (slowly)  Plan      Precautions / Restrictions Precautions Precautions: Fall Precaution Comments: morbidly obese Restrictions Weight Bearing Restrictions: No   Pertinent Vitals/Pain Wound pain.     Mobility  Bed Mobility Bed Mobility: Supine to Sit;Sitting - Scoot to Edge of Bed Supine to Sit: 1: +2 Total assist;HOB elevated Supine to Sit: Patient Percentage: 20% Sitting - Scoot to Edge of Bed: 1: +2 Total assist Sitting - Scoot to Edge of Bed: Patient Percentage: 30% Details for Bed Mobility Assistance: Attempted to have pt roll to Rt side but pt unable to reach LLE across body & rotate trunk/hips into sidelying position.  Pt requires signficant (A) to lift shoulders/trunk to sitting upright, & use of draw pad to pivot/scoot hips to EOB.   Transfers Transfers: Sit to Stand;Stand to Sit Sit to Stand: 1: +2 Total assist;With upper extremity assist;From bed;With armrests;From chair/3-in-1 Sit to Stand: Patient Percentage: 70% Stand to Sit: 1: +2 Total assist;With upper extremity assist;With armrests;To chair/3-in-1 Stand to Sit: Patient Percentage: 70% Details for Transfer Assistance: Pt cont's to pull self to standing with hands on RW due to body habitus.   Ambulation/Gait Ambulation/Gait Assistance: 4: Min assist Ambulation Distance (Feet): 20 Feet (10' x 2) Assistive device: Rolling walker Ambulation/Gait Assistance Details: Pt ambulated bed<>door with lengthy seated rest break between.  Relies heavily on bil UE's on RW for support.  Pt with short shuffed steps.  Fatigues quickly & c/o LE's feels heavy & weak.   Gait Pattern: Decreased step length - right;Decreased step length - left;Decreased hip/knee flexion - right;Decreased hip/knee flexion - left Gait velocity: decreased Stairs: No Wheelchair Mobility Wheelchair Mobility: No      PT Goals (current goals can now be found in the care plan section) Acute Rehab PT Goals PT Goal Formulation: With patient Time  For Goal Achievement: 08/02/13 Potential to Achieve Goals: Fair  Visit Information  Last PT Received On: 07/28/13 Assistance Needed: +2 History of Present Illness: [name] is an 52 y.o. female with multiple medical problems including DM, morbid obesity, asthma, CHF (Epic said systolic, but EF 60 percent, so likely diastolic), CKD 3, Hypothyroidism presents to the ERWith 3 days hx of increase shortness of breath, chest tightness, and some orthopnea.  She was having some wheezing, and had seen her pulmonologist, who tx her for possible GERD.  She denied fever, chills, or coughs.  But  Admitted to have some orthopnea.  Her legs have been swelling more, but she has no calf tenderness.  Work up in the ER included a CXR which showed cardiomegaly and borderline CHF.  Her WBC was 14K, and her Cr is 4.4.She has multiple peri area wounds that are draining.    Subjective Data      Cognition  Cognition Arousal/Alertness: Awake/alert Behavior During Therapy: WFL for tasks assessed/performed Overall Cognitive Status: Within Functional Limits for tasks assessed    Balance     End of Session PT - End of Session Activity Tolerance: Patient limited by fatigue;Patient limited by pain Patient left: in chair;with call bell/phone within reach Nurse Communication: Mobility status   GP     Sena Hitch 07/28/2013, 1:44 PM   Sarajane Marek, [place] 07/28/2013

## 2013-07-28 NOTE — Progress Notes (Signed)
[facility] PROGRESS NOTE  [name] [id number] DOB: Jun 18, 1961 DOA: 07/16/2013 PCP: [name], MD  HPI/Subjective: Patient remained stable, has no new complaints. Nutrition is in her room provided education on renal diet. Husband and mother are present. No acute events overnight.  Assessment/Plan:  CHF, acute on chronic combined -Increased fluid overload, will likely be started on hemodialysis. -Continue Imdur, Bystolic and hydralazine. -Status post transthoracic echocardiogram performed on 07/17/2013 with LVEF of 50-55%, improved from 35% in 2011, has grade 2 diastolic dysfunction.  Acute on chronic renal failure  -Nephrology following. Patient with history of cardiorenal syndrome. Creatinine has continued to rise. She has been receiving high-dose Lasix 160 mg IV every 8 hours.  VRE UTI -Urine culture grew VRE, patient was on vancomycin and switched to levofloxacin. Plan to treat for a total of 12 days of antibiotic therapy. Pharmacy is dosing Levaquin.   Diabetes mellitus type 2 -Controlled diabetes mellitus type 2 with hemoglobin A1c of 5.7, this indicates mean plasma glucose of 117. -Continue insulin sliding scale, blood sugars remain well controlled with chem 7 showing glucose in the low 100s consistently.  Hidradenitis suppurativa -Multiple areas of abscesses, with purulent discharge. There has been no change during this hospitalization. -Continue Levaquin therapy.  -Added Hibiclens for wound cleansing. -Patient was offered surgical consultation for her cellulitis/hydradenitis suppurativa, patient declined.  Rectovaginal fistula -Referred to Dr. Harolyn Rutherford [facility] as outpatient.  OSA -Reported that she is not on CPAP at home, which will be ordered on discharge. Meanwhile continue CPAP at nighttime.  Code Status: Full code Family Communication: Plan discussed with patient Disposition Plan: Remains the patient.   Consultants:  [employer].  Procedures:  None  Antibiotics:  None   Objective: Filed Vitals:   07/28/13 0444  BP: 142/82  Pulse: 58  Temp: 97.5 F (36.4 C)  Resp: 22    Intake/Output Summary (Last 24 hours) at 07/28/13 1404 Last data filed at 07/28/13 [id number]  Gross per 24 hour  Intake    806 ml  Output   1150 ml  Net   -344 ml   Filed Weights   07/26/13 0542 07/27/13 0513 07/28/13 0536  Weight: 179.398 kg (395 lb 8 oz) 182.618 kg (402 lb 9.6 oz) 183.798 kg (405 lb 3.2 oz)    Exam: General: Alert and awake, oriented x3, not in any acute distress. Morbid obesity HEENT: anicteric sclera, pupils reactive to light and accommodation, EOMI CVS: S1-S2 clear, no murmur rubs or gallops Chest: clear to auscultation bilaterally, no wheezing, rales or rhonchi Abdomen: soft nontender, nondistended, normal bowel sounds, no organomegaly Extremities: +1 to +2 pedal edema bilaterally. Neuro: Cranial nerves II-XII intact, no focal neurological deficits Skin: Right axillary swelling noted no purulent discharge. There is right inguinal swelling erythema pain to palpation.  Data Reviewed: Basic Metabolic Panel:  Recent Labs Lab 07/23/13 0708 07/24/13 0421 07/25/13 0600 07/26/13 0805 07/27/13 0535 07/28/13 0450  NA 141 142 141 138 139 139  K 4.2 4.4 4.7 4.7 4.6 4.6  CL 106 108 106 105 104 105  CO2 23 23 20 20 22 22   GLUCOSE 95 108* 99 125* 117* 113*  BUN 75* 76* 79* 82* 86* 92*  CREATININE 5.82* 6.01* 6.00* 6.32* 6.62* 6.77*  CALCIUM 8.9 9.0 9.1 8.8 8.8 8.8  MG 2.4 2.5 2.6* 2.6* 2.6*  --   PHOS 4.5 4.9* 4.8* 5.5* 5.4* 5.3*   Liver Function Tests:  Recent Labs Lab 07/24/13 0421 07/25/13 0600 07/26/13 0805 07/27/13 0535 07/28/13  0450  ALBUMIN 2.3* 2.4* 2.3* 2.2* 2.1*   No results found for this basename: LIPASE, AMYLASE,  in the last 168 hours No results found for this basename: AMMONIA,  in the last 168 hours CBC:  Recent Labs Lab 07/24/13 0421 07/25/13 0600 07/26/13 0805  07/27/13 0535 07/28/13 0450  WBC 14.6* 14.6* 15.3* 14.1* 14.7*  NEUTROABS 11.8* 11.9* 12.6* 11.5* 12.0*  HGB 10.6* 11.1* 11.0* 10.1* 10.2*  HCT 36.5 37.5 37.3 34.9* 34.8*  MCV 92.9 92.8 91.9 92.1 91.8  PLT 217 206 247 197 190   Cardiac Enzymes: No results found for this basename: CKTOTAL, CKMB, CKMBINDEX, TROPONINI,  in the last 168 hours BNP (last 3 results)  Recent Labs  08/16/12 2049 11/10/12 1724 07/16/13 1522  PROBNP 26806.0* 33908.0* 29655.0*   CBG:  Recent Labs Lab 07/27/13 1116 07/27/13 1544 07/27/13 2208 07/28/13 0721 07/28/13 1120  GLUCAP 109* 158* 114* 106* 130*    No results found for this or any previous visit (from the past 240 hour(s)).   Studies: No results found.  Scheduled Meds: . aspirin EC  81 mg Oral q morning - 10a  . calcitRIOL  0.25 mcg Oral Daily  . calcium carbonate  1 tablet Oral BID WC  . DULoxetine  60 mg Oral Daily  . famotidine  20 mg Oral QHS  . feeding supplement  1 Container Oral Q24H  . ferrous sulfate  325 mg Oral BID  . furosemide  160 mg Intravenous Q8H  . heparin  5,000 Units Subcutaneous Q8H  . insulin aspart  0-15 Units Subcutaneous TID WC  . insulin aspart  0-5 Units Subcutaneous QHS  . isosorbide mononitrate  30 mg Oral q morning - 10a  . levofloxacin  500 mg Oral Q48H  . levothyroxine  25 mcg Oral QAC breakfast  . nebivolol  10 mg Oral BID  . saccharomyces boulardii  250 mg Oral BID  . senna-docusate  1 tablet Oral BID  . simvastatin  20 mg Oral q1800  . sodium chloride  3 mL Intravenous Q12H   Continuous Infusions:   Principal Problem:   Diastolic CHF Active Problems:   Morbid obesity   GERD   DM type 2 (diabetes mellitus, type 2)   Hypertension   Renal failure (ARF), acute on chronic   SOB (shortness of breath)   OSA (obstructive sleep apnea)   UTI (urinary tract infection)    Time spent: 35 minutes    Kelvin Cellar  [facility] Pager [phone]. If 7PM-7AM, please contact  night-coverage at [email or web address], password [facility] 07/28/2013, 2:04 PM  LOS: 12 days

## 2013-07-28 NOTE — Plan of Care (Signed)
Problem: Phase II Progression Outcomes Goal: Walk in hall or up in chair TID Outcome: Progressing Up in chair with PT today and also assist to [facility] with 3 assistance.

## 2013-07-28 NOTE — Progress Notes (Signed)
NUTRITION FOLLOW-UP  DOCUMENTATION CODES Per approved criteria  -Morbid Obesity   INTERVENTION: Continue Resource Breeze po daily, each supplement provides 250 kcal and 9 grams of protein. RD to continue to follow nutrition care plan. Renal diet education provided.   NUTRITION DIAGNOSIS: Increased nutrient needs related to wound healing as evidenced by estimated needs. Ongoing.  Goal: Intake to meet >90% of estimated nutrition needs. Met.  Monitor:  weight trends, lab trends, I/O's, PO intake, supplement tolerance  ASSESSMENT: PMHx significant for DM, obesity, asthma, CHF, CKD III. Admitted with SOB and leg swelling x 3 days. Work-up reveals acute on chronic diastolic CHF.  Pt with cardiorenal syndrome. Continues on Renal 60-70 diet and is eating 100% of meals. Also continues with Resource Breeze oral nutrition supplement ordered daily. Received consult for renal diet education. Patient's mother and husband present for education; both very interested in renal diet education for patient.   Provided Choose-A-Meal Booklet to patient/family. Reviewed food groups and provided written recommended serving sizes specifically determined for patient's current nutritional status.  Explained why diet restrictions are needed and provided lists of foods to limit/avoid that are high potassium, sodium, and phosphorus. Provided specific recommendations on safer alternatives of these foods. Strongly encouraged compliance of this diet.  Discussed importance of protein intake at each meal and snack. Provided examples of how to maximize protein intake throughout the day. Discussed need for fluid restriction with dialysis, importance of minimizing weight gain between HD treatments, and renal-friendly beverage options. Teach back method used. Expect good compliance.  Height: Ht Readings from Last 1 Encounters:  07/18/13 5' 6.53" (1.69 m)    Weight: Wt Readings from Last 1 Encounters:  07/28/13 405 lb  3.2 oz (183.798 kg)  Admit wt 371 lb - pt does not know her dry weight.  BMI:  Body mass index is 64.35 kg/(m^2). Obese Class III  Estimated Nutritional Needs: Kcal: 2000 - 2200 Protein: 80 - 95 g Fluid: 1.5 - 1.8 liters daily  Skin:  Multiple small open stage II wounds  Diet Order: Renal 80/90-2-2; 1200 ml fluid restriction; Resource Breeze PO once daily.  EDUCATION NEEDS: -Education needs addressed.      Intake/Output Summary (Last 24 hours) at 07/28/13 1415 Last data filed at 07/28/13 [id number]  Gross per 24 hour  Intake    806 ml  Output   1150 ml  Net   -344 ml    Last BM: 8/30  Labs:   Recent Labs Lab 07/25/13 0600 07/26/13 0805 07/27/13 0535 07/28/13 0450  NA 141 138 139 139  K 4.7 4.7 4.6 4.6  CL 106 105 104 105  CO2 20 20 22 22   BUN 79* 82* 86* 92*  CREATININE 6.00* 6.32* 6.62* 6.77*  CALCIUM 9.1 8.8 8.8 8.8  MG 2.6* 2.6* 2.6*  --   PHOS 4.8* 5.5* 5.4* 5.3*  GLUCOSE 99 125* 117* 113*    CBG (last 3)   Recent Labs  07/27/13 2208 07/28/13 0721 07/28/13 1120  GLUCAP 114* 106* 130*    Scheduled Meds: . aspirin EC  81 mg Oral q morning - 10a  . calcitRIOL  0.25 mcg Oral Daily  . calcium carbonate  1 tablet Oral BID WC  . DULoxetine  60 mg Oral Daily  . famotidine  20 mg Oral QHS  . feeding supplement  1 Container Oral Q24H  . ferrous sulfate  325 mg Oral BID  . furosemide  160 mg Intravenous Q8H  . heparin  5,000 Units Subcutaneous  Q8H  . insulin aspart  0-15 Units Subcutaneous TID WC  . insulin aspart  0-5 Units Subcutaneous QHS  . isosorbide mononitrate  30 mg Oral q morning - 10a  . levofloxacin  500 mg Oral Q48H  . levothyroxine  25 mcg Oral QAC breakfast  . nebivolol  10 mg Oral BID  . saccharomyces boulardii  250 mg Oral BID  . senna-docusate  1 tablet Oral BID  . simvastatin  20 mg Oral q1800  . sodium chloride  3 mL Intravenous Q12H    Continuous Infusions:  none   Molli Barrows, RD, LDN, [place] Pager [phone] After Hours  Pager [phone]

## 2013-07-29 ENCOUNTER — Encounter (HOSPITAL_COMMUNITY): Payer: Self-pay | Admitting: Vascular Surgery

## 2013-07-29 LAB — CBC WITH DIFFERENTIAL/PLATELET
Eosinophils Relative: 4 % (ref 0–5)
Lymphocytes Relative: 6 % — ABNORMAL LOW (ref 12–46)
Lymphs Abs: 1 10*3/uL (ref 0.7–4.0)
MCV: 89 fL (ref 78.0–100.0)
Neutro Abs: 14.4 10*3/uL — ABNORMAL HIGH (ref 1.7–7.7)
Neutrophils Relative %: 85 % — ABNORMAL HIGH (ref 43–77)
Platelets: 213 10*3/uL (ref 150–400)
RBC: 3.99 MIL/uL (ref 3.87–5.11)
WBC: 16.8 10*3/uL — ABNORMAL HIGH (ref 4.0–10.5)

## 2013-07-29 LAB — COMPREHENSIVE METABOLIC PANEL
ALT: <5 U/L (ref 0–35)
AST: 8 U/L (ref 0–37)
Albumin: 2.2 g/dL — ABNORMAL LOW (ref 3.5–5.2)
Alkaline Phosphatase: 87 U/L (ref 39–117)
CO2: 20 mEq/L (ref 19–32)
Chloride: 103 mEq/L (ref 96–112)
GFR calc non Af Amer: 6 mL/min — ABNORMAL LOW (ref >90)
Potassium: 4.5 mEq/L (ref 3.5–5.1)
Sodium: 136 mEq/L (ref 135–145)
Total Bilirubin: 0.4 mg/dL (ref 0.3–1.2)

## 2013-07-29 LAB — GLUCOSE, CAPILLARY
Glucose-Capillary: 104 mg/dL — ABNORMAL HIGH (ref 70–99)
Glucose-Capillary: 122 mg/dL — ABNORMAL HIGH (ref 70–99)
Glucose-Capillary: 126 mg/dL — ABNORMAL HIGH (ref 70–99)

## 2013-07-29 LAB — PHOSPHORUS: Phosphorus: 5.2 mg/dL — ABNORMAL HIGH (ref 2.3–4.6)

## 2013-07-29 MED ORDER — SODIUM CHLORIDE 0.9 % IV SOLN
1020.0000 mg | Freq: Once | INTRAVENOUS | Status: AC
  Administered 2013-07-29: 13:00:00 1020 mg via INTRAVENOUS
  Filled 2013-07-29: qty 34

## 2013-07-29 MED ORDER — LACTULOSE 10 GM/15ML PO SOLN
10.0000 g | Freq: Two times a day (BID) | ORAL | Status: DC | PRN
  Filled 2013-07-29: qty 15

## 2013-07-29 MED ORDER — DOCUSATE SODIUM 100 MG PO CAPS
100.0000 mg | ORAL_CAPSULE | Freq: Two times a day (BID) | ORAL | Status: DC
  Administered 2013-07-29 – 2013-07-30 (×2): 100 mg via ORAL
  Filled 2013-07-29 (×3): qty 1

## 2013-07-29 NOTE — Progress Notes (Signed)
Cosigned for [employer], I/O, med administration, care plan/education. Ruben Im RN

## 2013-07-29 NOTE — Progress Notes (Signed)
Subjective: Interval History: has no complaint of N, V, and has good appetite. Breathing ok..  Objective: Vital signs in last 24 hours: Temp:  [97 F (36.1 C)-97.5 F (36.4 C)] 97 F (36.1 C) (09/02 0534) Pulse Rate:  [56-60] 56 (09/02 0534) Resp:  [20-21] 20 (09/02 0534) BP: (122-159)/(73-85) 159/85 mmHg (09/02 0534) SpO2:  [94 %-100 %] 100 % (09/02 0534) Weight:  [181.439 kg (400 lb)] 181.439 kg (400 lb) (09/02 0534) Weight change: -2.359 kg (-5 lb 3.2 oz)  Intake/Output from previous day: 09/01 0701 - 09/02 0700 In: 577 [P.O.:577] Out: 1350 [Urine:1350] Intake/Output this shift: Total I/O In: 120 [P.O.:120] Out: 450 [Urine:450]  General appearance: alert, cooperative and morbidly obese Resp: diminished breath sounds bilaterally Cardio: S1, S2 normal and systolic murmur: holosystolic 2/6, blowing at apex GI: massive, pannus indurated and tender, pos bs,Liver down 6 cm Extremities: edema 2+  Lab Results:  Recent Labs  07/28/13 0450 07/29/13 0851  WBC 14.7* 16.8*  HGB 10.2* 11.0*  HCT 34.8* 35.5*  PLT 190 213   BMET:  Recent Labs  07/28/13 0450 07/29/13 0851  NA 139 136  K 4.6 4.5  CL 105 103  CO2 22 20  GLUCOSE 113* 99  BUN 92* 94*  CREATININE 6.77* 6.98*  CALCIUM 8.8 8.9   No results found for this basename: PTH,  in the last 72 hours Iron Studies:  Recent Labs  07/28/13 0930  IRON 23*  TIBC 149*    Studies/Results: No results found.  I have reviewed the patient's current medications.  Assessment/Plan: 1 CKD 5 vol better, will hold Lasix.  Cr rising but tol well. No indic for intervention 2 Anemia will give Fe 3 DM controlled.   4 HPTH f/u 5 Massive obesity 6 Hydradenitis 7 OSA P Hold lasix, epo, Fe, glu control    LOS: 13 days   Marva Hendryx L 07/29/2013,10:16 AM

## 2013-07-29 NOTE — Progress Notes (Signed)
Pt. Refuses CPAP at this time. Pt. Was made aware to let RT know anytime during the night if she changed her mind & decided to wear CPAP.

## 2013-07-29 NOTE — Progress Notes (Signed)
Interim Discharge Summary    Discharge Diagnosis 1) Acute on chronic renal failure, BUN and creatinine now at 94 and is 6.98 respectively 2) Acute decompensated congestive heart failure 3) Cardiorenal Syndrome 4) VRE urinary tract infection 5) Hidradenitis suppurativa 6) Morbid obesity 7) Type 2 diabetes mellitus 8) Obesity hypoventilation syndrome 9) OSA 10) Anemia of chronic disease 11) Major Depression 12) H/O Hydronephrosis of left kidney  Consultants Nephrology: Dr Broadus John Coladonato/ Dr Erling Cruz Vascular surgery: Dr Sherren Mocha Early   Procedures  1) Left upper arm AV fistula placement, procedure performed by Dr. Sherren Mocha early, date of service 07/25/2013 2) transthoracic echocardiogram performed on 07/17/2013 impression: Estimated ejection fraction 50-55%, no diagnostic regional wall motion abnormality was identified. Grade 2 diastolic dysfunction   Cultures  1) urine culture drawn on 07/17/2013 growing vancomycin-resistant enterococcus, organism sensitive to ampicillin Levaquin nitrofurantoin, resistant to tetracycline and vancomycin 2) blood cultures drawn on 07/17/2013 showing no growth  Brief history  Patient is a 52 year old female with multiple comorbidities including congestive heart failure, stage  Stage IV 25 chronic kidney disease, morbid obesity, with history of multiple hospitalizations who presented to the emergency department on 07/16/2013 with complaints of increasing shortness of breath and worsening bilateral extremity pitting edema.  She has had multiple previous hospitalizations for croup fluid overload and had required intermittent HD. She was started on Lasix IV. Initial lab work showed a creatinine of 4.65 BUN of 61. Throughout her hospitalization her BUN and creatinine continued to rise. Her Lasix was held by nephrology for several days however despite this kidney function did not improve. Given lack of significant improvement with ongoing issues with volume  overload as well as multiple comorbidities, nephrology consulted vascular surgery for placement of AV access. She had vein mapping done in July of 2013 this was repeated. Initially there was concerns about placement of AV fistula with history of hidradenitis suppurativa in the axilla. Vascular surgery review vein map done on this admission. Dr. early of vascular surgery performed placement of left upper arm AV fistula, on 07/25/2013. Patient tolerated procedure well. Meanwhile her creatinine has continued to rise, with BUN/creatinine of 94 and 6.98 on a.m. lab work of 07/29/2013. Potassium has remained within normal limits at 4.5, having bicarbonate of 20. Patient over the last several days had reported breathing easier. Nephrology on 07/29/2013 recommending holding Lasix after receiving high dose of IV Lasix. At this point in time there is no indication for hemodialysis.  Other issues during this hospitalization, patient having urine cultures drawn he 21 2014 with screw vancomycin resistant enterococcus. She was started on Levaquin or planned duration of 12 days. This Thursday should be her last dose of Levaquin. She has remained afebrile and nontoxic. Patient with history of hydradenitis suppurativa, had been offered a surgical consultation during this hospitalization which she refused. This has remained relatively stable during this hospitalization. Patient having a prolonged hospitalization, is quite deconditioned. I had a discussion with her today regarding the possibility of transferring to skilled nursing facility from this hospitalization for rehabilitation. I shared with her my concerns for family members at home being able to care for her needs. She verbalized understanding however expresses her desire to go home from this hospitalization. She is interested in resuming home health services with home PT. Overall it seems patient has tolerated elevated BUN and creatinine. Will need to touch base with  nephrology regarding timing of discharge.

## 2013-07-29 NOTE — Progress Notes (Signed)
Occupational Therapy Treatment Patient Details Name: [name] MRN: [id number] DOB: 1961/02/23 Today's Date: 07/29/2013 Time: [id number] OT Time Calculation (min): 39 min  OT Assessment / Plan / Recommendation  History of present illness [name] is an 52 y.o. female with multiple medical problems including DM, morbid obesity, asthma, CHF (Epic said systolic, but EF 60 percent, so likely diastolic), CKD 3, Hypothyroidism presents to the ERWith 3 days hx of increase shortness of breath, chest tightness, and some orthopnea.  She was having some wheezing, and had seen her pulmonologist, who tx her for possible GERD.  She denied fever, chills, or coughs.  But  Admitted to have some orthopnea.  Her legs have been swelling more, but she has no calf tenderness.  Work up in the ER included a CXR which showed cardiomegaly and borderline CHF.  Her WBC was 14K, and her Cr is 4.4.She has multiple peri area wounds that are draining.   OT comments  Pt limited by pain, weakness, and body habitus.  [name] participated in session and stated she was comfortable assisting pt at home as she has been in this state of dependence in the past.    Follow Up Recommendations  Home health OT;Supervision/Assistance - 24 hour    Barriers to Discharge       Equipment Recommendations  None recommended by OT    Recommendations for Other Services    Frequency Min 2X/week   Progress towards OT Goals Progress towards OT goals: Progressing toward goals  Plan Discharge plan needs to be updated    Precautions / Restrictions Precautions Precautions: Fall Precaution Comments: morbidly obese Restrictions Weight Bearing Restrictions: No   Pertinent Vitals/Pain VSS, pain in buttocks wound    ADL  Toilet Transfer: +2 Total assistance Toilet Transfer: Patient Percentage: 60% Toilet Transfer Method: Sit to Loss adjuster, chartered: Extra wide bedside commode Toileting - Clothing Manipulation and Hygiene: +1  Total assistance Where Assessed - Camera operator Manipulation and Hygiene: Standing Equipment Used: Rolling walker Transfers/Ambulation Related to ADLs: +2 total assist pt 60% ADL Comments: Pt with dependence in bathing, dressing, toileting,  Mom observed entire session, feels confident they can manage pt at home.    OT Diagnosis:    OT Problem List:   OT Treatment Interventions:     OT Goals(current goals can now be found in the care plan section) Acute Rehab OT Goals Patient Stated Goal: return home  Visit Information  Last OT Received On: 07/29/13 Assistance Needed: +2 PT/OT Co-Evaluation/Treatment: Yes History of Present Illness: [name] is an 52 y.o. female with multiple medical problems including DM, morbid obesity, asthma, CHF (Epic said systolic, but EF 60 percent, so likely diastolic), CKD 3, Hypothyroidism presents to the ERWith 3 days hx of increase shortness of breath, chest tightness, and some orthopnea.  She was having some wheezing, and had seen her pulmonologist, who tx her for possible GERD.  She denied fever, chills, or coughs.  But  Admitted to have some orthopnea.  Her legs have been swelling more, but she has no calf tenderness.  Work up in the ER included a CXR which showed cardiomegaly and borderline CHF.  Her WBC was 14K, and her Cr is 4.4.She has multiple peri area wounds that are draining.    Subjective Data      Prior Functioning       Cognition  Cognition Arousal/Alertness: Awake/alert Behavior During Therapy: WFL for tasks assessed/performed Overall Cognitive Status: Within Functional Limits for tasks assessed  Mobility  Bed Mobility Bed Mobility: Supine to Sit;Sitting - Scoot to Edge of Bed Supine to Sit: 1: +2 Total assist;HOB elevated Supine to Sit: Patient Percentage: 20% Sitting - Scoot to Edge of Bed: 1: +2 Total assist Sitting - Scoot to Edge of Bed: Patient Percentage: 30% Details for Bed Mobility Assistance: Pt still unable to  roll to side & reach across body with LUE for Rt rail.  HOB elevated 75 degrees.  Pt cont's to require significant assist to bring shoulders/trunk forwards & use of draw pad to bring hips around to EOB.  Body habitus & pain limit mobility.   Transfers Sit to Stand: 4: Min guard;With upper extremity assist;From bed;1: +2 Total assist;With armrests;From chair/3-in-1 Sit to Stand: Patient Percentage: 50% Stand to Sit: 1: +2 Total assist;With upper extremity assist;With armrests;To chair/3-in-1 Stand to Sit: Patient Percentage: 70% Details for Transfer Assistance: Pt required increased assist to achieve standing from recliner.      Exercises      Balance     End of Session OT - End of Session Equipment Utilized During Treatment: Rolling walker Activity Tolerance: Patient limited by pain;Patient limited by fatigue Patient left: in chair;with call bell/phone within reach;with family/visitor present Nurse Communication: Patient requests pain meds;Mobility status  GO     Malka So 07/29/2013, 2:40 PM [phone]

## 2013-07-29 NOTE — Progress Notes (Signed)
Pt received full bathed this AM. Pink foam to sacral wounds with minimal drainage. Dressing to be changed once a day if soiled, dressings left intact. Pt provided with peri-care/foley care. Shelby Mattocks, RN

## 2013-07-29 NOTE — Progress Notes (Signed)
[facility] PROGRESS NOTE  [name] [id number] DOB: 07-20-1961 DOA: 07/16/2013 PCP: [name], MD  HPI/Subjective: Patient has no complaints for me, she is maintaining well, alert and oriented x3. She reported nursing staff having constipation earlier. Otherwise she is tolerating oral intake, afebrile.  Assessment/Plan:  CHF, acute on chronic combined -Increased fluid overload, will likely be started on hemodialysis. -Continue Imdur, Bystolic and hydralazine. -Status post transthoracic echocardiogram performed on 07/17/2013 with LVEF of 50-55%, improved from 35% in 2011, has grade 2 diastolic dysfunction.  Acute on chronic renal failure  -Nephrology following. Patient with history of cardiorenal syndrome. Creatinine has continued to rise. She had been receiving high-dose Lasix 160 mg IV every 8 hours, which nephrology has held today. Has better urinary output today, with negative fluid balance of 1,640.   VRE UTI -Urine culture grew VRE, patient was on vancomycin and switched to levofloxacin. Plan to treat for a total of 12 days of antibiotic therapy. Pharmacy is dosing Levaquin. Last dose to be given on Thursday, 07/31/2013   Diabetes mellitus type 2 -Controlled diabetes mellitus type 2 with hemoglobin A1c of 5.7, this indicates mean plasma glucose of 117. -Continue insulin sliding scale, blood sugars remain well controlled with chem 7 showing glucose in the low 100s consistently.  Hidradenitis suppurativa -Multiple areas of abscesses, with purulent discharge. There has been no change during this hospitalization. -Continue Levaquin therapy.  -Added Hibiclens for wound cleansing. -Patient was offered surgical consultation for her cellulitis/hydradenitis suppurativa, patient declined.  Rectovaginal fistula -Referred to Dr. Harolyn Rutherford [facility] as outpatient.  OSA -Reported that she is not on CPAP at home, which will be ordered on discharge. Meanwhile continue CPAP at  nighttime.  Code Status: Full code Family Communication: Plan discussed with patient Disposition Plan: Remains the patient.   Consultants:  [employer].  Procedures:  None  Antibiotics:  None   Objective: Filed Vitals:   07/29/13 1347  BP: 147/81  Pulse: 60  Temp: 97.1 F (36.2 C)  Resp: 19    Intake/Output Summary (Last 24 hours) at 07/29/13 1940 Last data filed at 07/29/13 1843  Gross per 24 hour  Intake    560 ml  Output   2200 ml  Net  -1640 ml   Filed Weights   07/27/13 0513 07/28/13 0536 07/29/13 0534  Weight: 182.618 kg (402 lb 9.6 oz) 183.798 kg (405 lb 3.2 oz) 181.439 kg (400 lb)    Exam: General: Alert and awake, oriented x3, not in any acute distress. Morbid obesity HEENT: anicteric sclera, pupils reactive to light and accommodation, EOMI CVS: S1-S2 clear, no murmur rubs or gallops Chest: clear to auscultation bilaterally, no wheezing, rales or rhonchi Abdomen: soft nontender, nondistended, normal bowel sounds, no organomegaly Extremities: +1 to +2 pedal edema bilaterally. Neuro: Cranial nerves II-XII intact, no focal neurological deficits Skin: Right axillary swelling noted no purulent discharge. There is right inguinal swelling erythema pain to palpation.  Data Reviewed: Basic Metabolic Panel:  Recent Labs Lab 07/23/13 0708 07/24/13 0421 07/25/13 0600 07/26/13 0805 07/27/13 0535 07/28/13 0450 07/29/13 0851  NA 141 142 141 138 139 139 136  K 4.2 4.4 4.7 4.7 4.6 4.6 4.5  CL 106 108 106 105 104 105 103  CO2 23 23 20 20 22 22 20   GLUCOSE 95 108* 99 125* 117* 113* 99  BUN 75* 76* 79* 82* 86* 92* 94*  CREATININE 5.82* 6.01* 6.00* 6.32* 6.62* 6.77* 6.98*  CALCIUM 8.9 9.0 9.1 8.8 8.8 8.8 8.9  MG  2.4 2.5 2.6* 2.6* 2.6*  --   --   PHOS 4.5 4.9* 4.8* 5.5* 5.4* 5.3* 5.2*   Liver Function Tests:  Recent Labs Lab 07/25/13 0600 07/26/13 0805 07/27/13 0535 07/28/13 0450 07/29/13 0851  AST  --   --   --   --  8  ALT  --    --   --   --  <5  ALKPHOS  --   --   --   --  87  BILITOT  --   --   --   --  0.4  PROT  --   --   --   --  8.6*  ALBUMIN 2.4* 2.3* 2.2* 2.1* 2.2*   No results found for this basename: LIPASE, AMYLASE,  in the last 168 hours No results found for this basename: AMMONIA,  in the last 168 hours CBC:  Recent Labs Lab 07/25/13 0600 07/26/13 0805 07/27/13 0535 07/28/13 0450 07/29/13 0851  WBC 14.6* 15.3* 14.1* 14.7* 16.8*  NEUTROABS 11.9* 12.6* 11.5* 12.0* 14.4*  HGB 11.1* 11.0* 10.1* 10.2* 11.0*  HCT 37.5 37.3 34.9* 34.8* 35.5*  MCV 92.8 91.9 92.1 91.8 89.0  PLT 206 247 197 190 213   Cardiac Enzymes: No results found for this basename: CKTOTAL, CKMB, CKMBINDEX, TROPONINI,  in the last 168 hours BNP (last 3 results)  Recent Labs  08/16/12 2049 11/10/12 1724 07/16/13 1522  PROBNP 26806.0* 33908.0* 29655.0*   CBG:  Recent Labs Lab 07/28/13 1636 07/28/13 2038 07/29/13 0602 07/29/13 1229 07/29/13 1624  GLUCAP 119* 164* 99 104* 122*    No results found for this or any previous visit (from the past 240 hour(s)).   Studies: No results found.  Scheduled Meds: . aspirin EC  81 mg Oral q morning - 10a  . calcitRIOL  0.25 mcg Oral Daily  . calcium carbonate  1 tablet Oral BID WC  . docusate sodium  100 mg Oral BID  . DULoxetine  60 mg Oral Daily  . famotidine  20 mg Oral QHS  . feeding supplement  1 Container Oral Q24H  . ferrous sulfate  325 mg Oral BID  . heparin  5,000 Units Subcutaneous Q8H  . insulin aspart  0-15 Units Subcutaneous TID WC  . insulin aspart  0-5 Units Subcutaneous QHS  . isosorbide mononitrate  30 mg Oral q morning - 10a  . levofloxacin  500 mg Oral Q48H  . levothyroxine  25 mcg Oral QAC breakfast  . nebivolol  10 mg Oral BID  . saccharomyces boulardii  250 mg Oral BID  . senna-docusate  1 tablet Oral BID  . simvastatin  20 mg Oral q1800  . sodium chloride  3 mL Intravenous Q12H   Continuous Infusions:   Principal Problem:   Diastolic  CHF Active Problems:   Morbid obesity   GERD   DM type 2 (diabetes mellitus, type 2)   Hypertension   SOB (shortness of breath)   OSA (obstructive sleep apnea)   UTI (urinary tract infection)    Time spent: 35 minutes    Kelvin Cellar  [facility] Pager [phone]. If 7PM-7AM, please contact night-coverage at [email or web address], password [facility] 07/29/2013, 7:40 PM  LOS: 13 days

## 2013-07-29 NOTE — Progress Notes (Addendum)
ANTIBIOTIC CONSULT NOTE - FOLLOW UP  Pharmacy Consult for Levaquin Indication: VRE in Urine, Hidradenitis suppurativa   Allergies  Allergen Reactions  . Amoxicillin-Pot Clavulanate Diarrhea  . Rosiglitazone Maleate     REACTION: swelling  . Amoxicillin Rash    Tolerated Zosyn 01/2013.  [name]    Patient Measurements: Height: 5' 6.53" (169 cm) Weight: 400 lb (181.439 kg) (Bed Scale) IBW/kg (Calculated) : 60.53  Vital Signs: Temp: 97 F (36.1 C) (09/02 0534) Temp src: Oral (09/02 0534) BP: 159/85 mmHg (09/02 0534) Pulse Rate: 56 (09/02 0534) Intake/Output from previous day: 09/01 0701 - 09/02 0700 In: 577 [P.O.:577] Out: 1350 [Urine:1350] Intake/Output from this shift: Total I/O In: -  Out: 450 [Urine:450]  Labs:  Recent Labs  07/27/13 0535 07/28/13 0450  WBC 14.1* 14.7*  HGB 10.1* 10.2*  PLT 197 190  CREATININE 6.62* 6.77*   Estimated Creatinine Clearance: 16.9 ml/min (by C-G formula based on Cr of 6.77). No results found for this basename: VANCOTROUGH, Corlis Leak, VANCORANDOM, [place], [place], [place], [place], [place], TOBRARND, AMIKACINPEAK, AMIKACINTROU, AMIKACIN,  in the last 72 hours   Microbiology: Recent Results (from the past 720 hour(s))  URINE CULTURE     Status: None   Collection Time    07/17/13 12:29 PM      Result Value Range Status   Specimen Description URINE, CATHETERIZED   Final   Special Requests NONE   Final   Culture  Setup Time     Final   Value: 07/17/2013 16:43     Performed at [place]     Final   Value: >=100,000 COLONIES/ML     Performed at [facility]   Culture     Final   Value: VANCOMYCIN RESISTANT ENTEROCOCCUS ISOLATED     Note: CRITICAL RESULT CALLED TO, READ BACK BY AND VERIFIED WITH: ALLEN T. @ 11:00 48M 8.25.14 BY DESIS     Performed at [facility]   Report Status 07/21/2013 FINAL   Final   Organism ID, Bacteria VANCOMYCIN RESISTANT ENTEROCOCCUS ISOLATED   Final   CULTURE, BLOOD (ROUTINE X 2)     Status: None   Collection Time    07/17/13  3:27 PM      Result Value Range Status   Specimen Description BLOOD LEFT HAND   Final   Special Requests BOTTLES DRAWN AEROBIC ONLY 10CC   Final   Culture  Setup Time     Final   Value: 07/17/2013 22:26     Performed at [facility]   Culture     Final   Value: NO GROWTH 5 DAYS     Performed at [facility]   Report Status 07/23/2013 FINAL   Final  CULTURE, BLOOD (ROUTINE X 2)     Status: None   Collection Time    07/17/13  3:33 PM      Result Value Range Status   Specimen Description BLOOD RIGHT HAND   Final   Special Requests BOTTLES DRAWN AEROBIC ONLY 5CC   Final   Culture  Setup Time     Final   Value: 07/17/2013 22:26     Performed at [facility]   Culture     Final   Value: NO GROWTH 5 DAYS     Performed at [facility]   Report Status 07/23/2013 FINAL   Final  URINE CULTURE     Status: None   Collection Time  07/18/13  8:31 AM      Result Value Range Status   Specimen Description URINE, CATHETERIZED   Final   Special Requests NONE   Final   Culture  Setup Time     Final   Value: 07/18/2013 13:49     Performed at [place]     Final   Value: 60,000 COLONIES/ML     Performed at [facility]   Culture     Final   Value: ENTEROCOCCUS SPECIES     Note: SUSCEPTIBILITIES PERFORMED ON PREVIOUS CULTURE WITHIN THE LAST 5 DAYS.     Performed at [facility]   Report Status 07/21/2013 FINAL   Final    Anti-infectives   Start     Dose/Rate Route Frequency Ordered Stop   07/25/13 0500  vancomycin (VANCOCIN) IVPB 1000 mg/200 mL premix     1,000 mg 200 mL/hr over 60 Minutes Intravenous To Surgery 07/24/13 1345 07/25/13 0657   07/23/13 1600  levofloxacin (LEVAQUIN) tablet 500 mg     500 mg Oral Every 48 hours 07/23/13 0820     07/21/13 1600  levofloxacin (LEVAQUIN) tablet 750 mg  Status:  Discontinued     750 mg Oral  Every 48 hours 07/21/13 1416 07/23/13 0820   07/19/13 1200  vancomycin (VANCOCIN) 1,500 mg in sodium chloride 0.9 % 500 mL IVPB  Status:  Discontinued     1,500 mg 250 mL/hr over 120 Minutes Intravenous Every 48 hours 07/19/13 1122 07/21/13 1410   07/18/13 1000  ciprofloxacin (CIPRO) IVPB 400 mg  Status:  Discontinued     400 mg 200 mL/hr over 60 Minutes Intravenous Every 24 hours 07/18/13 0837 07/19/13 1055      Assessment: 52 year old female on day 9 Levaquin for a VRE UTI and hidradenitis suppurativa. She has acute on chronic renal failure and LUE AVF was placed for future plans of HD. Renal function has not improved therefore dosing of Levaquin will not change for remainder of treatment.  Cipro 8/22>> 8/23 Vanc 8/23>>8/25 Levaquin 8/25>>  8/21 BCx2 - NGTD 8/21 UCx - >100K VRE (S-Levaquin)  Plan:  Continue Levaquin 500 mg PO q48h Recommend length of therapy 7 to 14 days to minimize risk of C diff with fluoroquinolone use Pharmacy signing off, please re-consult if needed  [facility], [place].D., BCPS Clinical Pharmacist Pager: [phone] 07/29/2013 8:30 AM

## 2013-07-29 NOTE — Progress Notes (Signed)
Patient refused to wear CPAP tonight.  Was told if she changed her mind she could call RT.  RT will continue to monitor.

## 2013-07-29 NOTE — Progress Notes (Signed)
Physical Therapy Treatment Patient Details Name: [name] MRN: [id number] DOB: 1960-12-25 Today's Date: 07/29/2013 Time: [id number] PT Time Calculation (min): 39 min  PT Assessment / Plan / Recommendation  History of Present Illness [name] is an 52 y.o. female with multiple medical problems including DM, morbid obesity, asthma, CHF (Epic said systolic, but EF 60 percent, so likely diastolic), CKD 3, Hypothyroidism presents to the ERWith 3 days hx of increase shortness of breath, chest tightness, and some orthopnea.  She was having some wheezing, and had seen her pulmonologist, who tx her for possible GERD.  She denied fever, chills, or coughs.  But  Admitted to have some orthopnea.  Her legs have been swelling more, but she has no calf tenderness.  Work up in the ER included a CXR which showed cardiomegaly and borderline CHF.  Her WBC was 14K, and her Cr is 4.4.She has multiple peri area wounds that are draining.   PT Comments   [name] present entire session.  Pt mobility significantly limited by body habitus & pain.  Pt requires +2 assist for bed mobility.    [name] states she feels comfortable with assisting pt as needed at home.     Follow Up Recommendations  Home health PT;Supervision/Assistance - 24 hour     Does the patient have the potential to tolerate intense rehabilitation     Barriers to Discharge        Equipment Recommendations  Other (comment) (bariatric hospital bed)    Recommendations for Other Services    Frequency Min 3X/week   Progress towards PT Goals Progress towards PT goals: Progressing toward goals (slowly)  Plan      Precautions / Restrictions Precautions Precautions: Fall Precaution Comments: morbidly obese Restrictions Weight Bearing Restrictions: No   Pertinent Vitals/Pain C/o pain at wound sites on buttocks.  RN notified for pain medication.      Mobility  Bed Mobility Bed Mobility: Supine to Sit;Sitting - Scoot to Edge of  Bed Supine to Sit: 1: +2 Total assist;HOB elevated Supine to Sit: Patient Percentage: 20% Sitting - Scoot to Edge of Bed: 1: +2 Total assist Sitting - Scoot to Edge of Bed: Patient Percentage: 30% Details for Bed Mobility Assistance: Pt still unable to roll to side & reach across body with LUE for Rt rail.  HOB elevated 75 degrees.  Pt cont's to require significant assist to bring shoulders/trunk forwards & use of draw pad to bring hips around to EOB.  Body habitus & pain limit mobility.   Transfers Transfers: Sit to Stand;Stand to Sit Sit to Stand: 4: Min guard;With upper extremity assist;From bed;1: +2 Total assist;With armrests;From chair/3-in-1 Sit to Stand: Patient Percentage: 50% Stand to Sit: 1: +2 Total assist;With upper extremity assist;With armrests;To chair/3-in-1 Stand to Sit: Patient Percentage: 70% Details for Transfer Assistance: Pt required increased assist to achieve standing from recliner.   Ambulation/Gait Ambulation/Gait Assistance: 4: Min guard Ambulation Distance (Feet): 20 Feet (seated rest break at 10') Assistive device: Rolling walker Ambulation/Gait Assistance Details: pt with shuffling steps.  fatigues quickly.  Required lengthy seated rest break after 10'.   Gait Pattern: Decreased stride length;Trunk flexed;Shuffle Gait velocity: decreased Stairs: No Wheelchair Mobility Wheelchair Mobility: No     PT Goals (current goals can now be found in the care plan section) Acute Rehab PT Goals PT Goal Formulation: With patient Time For Goal Achievement: 08/02/13 Potential to Achieve Goals: Fair  Visit Information  Last PT Received On: 07/29/13 Assistance Needed: +2 History  of Present Illness: [name] is an 52 y.o. female with multiple medical problems including DM, morbid obesity, asthma, CHF (Epic said systolic, but EF 60 percent, so likely diastolic), CKD 3, Hypothyroidism presents to the ERWith 3 days hx of increase shortness of breath, chest tightness,  and some orthopnea.  She was having some wheezing, and had seen her pulmonologist, who tx her for possible GERD.  She denied fever, chills, or coughs.  But  Admitted to have some orthopnea.  Her legs have been swelling more, but she has no calf tenderness.  Work up in the ER included a CXR which showed cardiomegaly and borderline CHF.  Her WBC was 14K, and her Cr is 4.4.She has multiple peri area wounds that are draining.    Subjective Data      Cognition  Cognition Arousal/Alertness: Awake/alert Behavior During Therapy: WFL for tasks assessed/performed Overall Cognitive Status: Within Functional Limits for tasks assessed    Balance     End of Session PT - End of Session Activity Tolerance: Patient limited by fatigue;Patient limited by pain Patient left: in chair;with call bell/phone within reach;with family/visitor present Nurse Communication: Mobility status;Patient requests pain meds   GP     Sena Hitch 07/29/2013, 1:42 PM   Sarajane Marek, [place] [phone] 07/29/2013

## 2013-07-30 ENCOUNTER — Inpatient Hospital Stay (HOSPITAL_COMMUNITY): Payer: BC Managed Care – PPO

## 2013-07-30 LAB — CBC WITH DIFFERENTIAL/PLATELET
Basophils Relative: 0 % (ref 0–1)
Eosinophils Absolute: 0.8 10*3/uL — ABNORMAL HIGH (ref 0.0–0.7)
Hemoglobin: 10.8 g/dL — ABNORMAL LOW (ref 12.0–15.0)
Lymphs Abs: 1.2 10*3/uL (ref 0.7–4.0)
MCHC: 29.6 g/dL — ABNORMAL LOW (ref 30.0–36.0)
Monocytes Relative: 4 % (ref 3–12)
Neutro Abs: 13.5 10*3/uL — ABNORMAL HIGH (ref 1.7–7.7)
Neutrophils Relative %: 83 % — ABNORMAL HIGH (ref 43–77)
Platelets: 218 10*3/uL (ref 150–400)
RBC: 4.05 MIL/uL (ref 3.87–5.11)

## 2013-07-30 LAB — RENAL FUNCTION PANEL
Albumin: 2.2 g/dL — ABNORMAL LOW (ref 3.5–5.2)
BUN: 97 mg/dL — ABNORMAL HIGH (ref 6–23)
Chloride: 105 mEq/L (ref 96–112)
GFR calc Af Amer: 7 mL/min — ABNORMAL LOW (ref >90)
Phosphorus: 5.4 mg/dL — ABNORMAL HIGH (ref 2.3–4.6)
Potassium: 4.6 mEq/L (ref 3.5–5.1)
Sodium: 140 mEq/L (ref 135–145)

## 2013-07-30 LAB — GLUCOSE, CAPILLARY

## 2013-07-30 MED ORDER — LEVOFLOXACIN 500 MG PO TABS: 500.0000 mg | ORAL_TABLET | ORAL | Status: DC

## 2013-07-30 MED ORDER — FUROSEMIDE 80 MG PO TABS: 160.0000 mg | ORAL_TABLET | Freq: Two times a day (BID) | ORAL | Status: DC

## 2013-07-30 NOTE — Progress Notes (Signed)
Subjective: Interval History: has complaints wants to go home.  Objective: Vital signs in last 24 hours: Temp:  [97 F (36.1 C)-98 F (36.7 C)] 97 F (36.1 C) (09/03 0500) Pulse Rate:  [55-66] 55 (09/03 0500) Resp:  [18-19] 18 (09/03 0500) BP: (121-152)/(53-97) 133/53 mmHg (09/03 0500) SpO2:  [94 %-95 %] 95 % (09/03 0500) Weight:  [188.1 kg (414 lb 11 oz)] 188.1 kg (414 lb 11 oz) (09/03 0500) Weight change: 6.661 kg (14 lb 11 oz)  Intake/Output from previous day: 09/02 0701 - 09/03 0700 In: 560 [P.O.:560] Out: 1700 [Urine:1700] Intake/Output this shift: Total I/O In: 120 [P.O.:120] Out: 200 [Urine:200]  General appearance: alert, cooperative and morbidly obese Resp: diminished breath sounds bilaterally Cardio: S1, S2 normal GI: massive obesity, indurated pannus Extremities: 3+ edema, avf LUA B&T  Lab Results:  Recent Labs  07/29/13 0851 07/30/13 0520  WBC 16.8* 16.2*  HGB 11.0* 10.8*  HCT 35.5* 36.5  PLT 213 218   BMET:  Recent Labs  07/29/13 0851 07/30/13 0520  NA 136 140  K 4.5 4.6  CL 103 105  CO2 20 19  GLUCOSE 99 101*  BUN 94* 97*  CREATININE 6.98* 6.86*  CALCIUM 8.9 8.9   No results found for this basename: PTH,  in the last 72 hours [place] Studies:  Recent Labs  07/28/13 0930  IRON 23*  TIBC 149*    Studies/Results: No results found.  I have reviewed the patient's current medications.  Assessment/Plan: 1 CKD5 stable Cr, vol improving,  May D/C home and check Chem in 5 d and F/U with Dr.Powell in 2 wk 2 DM controlled 3 Obstruct check U/s prior to d/c 4 Obesity 5 Anemia P D/C ,put on Lasix 160mg  bid, chem in 5 d.    LOS: 14 days   Shaela Boer L 07/30/2013,11:30 AM

## 2013-07-30 NOTE — Progress Notes (Signed)
Called and spoke with [name], pt's mother.  [name] will be available to care for pt upon discharge today.  Coolidge Breeze, RN 07/30/2013

## 2013-07-30 NOTE — Discharge Summary (Addendum)
Physician Discharge Summary  [name] [id number] DOB: 09-19-1961 DOA: 07/16/2013  PCP: [name], MD  Admit date: 07/16/2013 Discharge date: 07/30/2013  Time spent: 35 minutes  Recommendations for Outpatient Follow-up:  1. Follow up with renal in 1 week with a b-met. 2. Dr. Florene Glen in 2 weeks. 3. Dr. Donnetta Hutching in 2-4 week. 4. Home with PT.   Discharge Diagnoses:  Principal Problem:   Diastolic CHF Active Problems:   Morbid obesity   GERD   DM type 2 (diabetes mellitus, type 2)   Hypertension   SOB (shortness of breath)   OSA (obstructive sleep apnea)   UTI (urinary tract infection)   Discharge Condition: stable  Diet recommendation: renal diet  Filed Weights   07/28/13 0536 07/29/13 0534 07/30/13 0500  Weight: 183.798 kg (405 lb 3.2 oz) 181.439 kg (400 lb) 188.1 kg (414 lb 11 oz)    History of present illness:  51 y.o. female with multiple medical problems including DM, morbid obesity, asthma, CHF (Epic said systolic, but EF 60 percent, so likely diastolic), CKD 3, Hypothyroidism presents to the ER  With 3 days hx of increase shortness of breath, chest tightness, and some orthopnea. She was having some wheezing, and had seen her pulmonologist, who tx her for possible GERD. She denied fever, chills, or coughs. But  Admitted to have some orthopnea. Her legs have been swelling more, but she has no calf tenderness. Work up in the ER included a CXR which showed cardiomegaly and borderline CHF. Her WBC was 14K, and her Cr is 4.4.  Hospitalist was asked to admit her for increased shortness of breath.   Hospital Course:  CHF, acute on chronic combined: - Increased fluid overload on admision, started on high dose lasix on admission. - Consulted renal which recommend left upper arm fistula. -Continue Imdur, Bystolic and hydralazine.  -Status post transthoracic echocardiogram performed on 07/17/2013 with LVEF of 50-55%, improved from 35% in 2011, has grade 2 diastolic  dysfunction.  -  Filed Weights   07/28/13 0536 07/29/13 0534 07/30/13 0500  Weight: 183.798 kg (405 lb 3.2 oz) 181.439 kg (400 lb) 188.1 kg (414 lb 11 oz)    Acute on chronic renal failure  -  Creatinine has continued to rise with diuresis - lasix was held for 24hrs and her Cr. Plateau. - she cont to diurese with out lasix. - renal US showed no hydronephrosis.  VRE UTI  -Urine culture grew VRE, patient was on vancomycin and switched to levofloxacin. Plan to treat for a total of 12 days of antibiotic therapy.   Diabetes mellitus type 2  -Controlled diabetes mellitus type 2 with hemoglobin A1c of 5.7, this indicates mean plasma glucose of 117.  - cont current therapy.  Hidradenitis suppurativa  -Multiple areas of abscesses, with purulent discharge. There has been no change during this hospitalization.  -Continue Levaquin therapy.  -Added Hibiclens for wound cleansing.  -Patient was offered surgical consultation for her cellulitis/hydradenitis suppurativa, patient declined.   Rectovaginal fistula  -Referred to Dr. Harolyn Rutherford [facility] as outpatient.   OSA  -Reported that she is not on CPAP at home, which will be ordered on discharge. Meanwhile continue CPAP at nighttime.      Procedures:  Abdominal US  Left upper extremity fistula    Consultations:  VAscular surgery  Renal  Discharge Exam: Filed Vitals:   07/30/13 0500  BP: 133/53  Pulse: 55  Temp: 97 F (36.1 C)  Resp: 18    General: A&O x3 Cardiovascular:  RRR Respiratory: good air movement CTA B/L  Discharge Instructions  Discharge Orders   Future Appointments Provider Department Dept Phone   08/26/2013 11:00 AM [facility] [phone]   08/26/2013 12:15 PM Rosetta Posner, MD [facility] [phone]   Future Orders Complete By Expires   Diet - low sodium heart healthy  As directed    Increase activity slowly  As directed         Medication List         aspirin EC 81 MG tablet  Take 81 mg by mouth every morning.     calcitRIOL 0.25 MCG capsule  Commonly known as:  ROCALTROL  Take 1 capsule (0.25 mcg total) by mouth daily.     calcium carbonate 600 MG Tabs tablet  Commonly known as:  OS-CAL  Take 600 mg by mouth 2 (two) times daily with a meal.     cyclobenzaprine 10 MG tablet  Commonly known as:  FLEXERIL  Take 1 tablet (10 mg total) by mouth 3 (three) times daily as needed for muscle spasms. For Spasms     DULoxetine 60 MG capsule  Commonly known as:  CYMBALTA  Take 1 capsule (60 mg total) by mouth daily.     famotidine 20 MG tablet  Commonly known as:  PEPCID  One at bedtime     feeding supplement Liqd  Take 1 Container by mouth 2 (two) times daily between meals.     ferrous sulfate 325 (65 FE) MG tablet  Take 1 tablet (325 mg total) by mouth 2 (two) times daily.     folic acid 1 MG tablet  Commonly known as:  FOLVITE  Take 1 tablet (1 mg total) by mouth daily.     furosemide 80 MG tablet  Commonly known as:  LASIX  Take 2 tablets (160 mg total) by mouth 2 (two) times daily.  Start taking on:  08/01/2013     hyoscyamine 0.125 MG tablet  Commonly known as:  LEVSIN, ANASPAZ  Take 1 tablet (0.125 mg total) by mouth every 4 (four) hours as needed for cramping (bladder spasms).     isosorbide mononitrate 30 MG 24 hr tablet  Commonly known as:  IMDUR  Take 30 mg by mouth every morning.     levofloxacin 500 MG tablet  Commonly known as:  LEVAQUIN  Take 1 tablet (500 mg total) by mouth every other day.     levothyroxine 25 MCG tablet  Commonly known as:  SYNTHROID, LEVOTHROID  Take 1 tablet (25 mcg total) by mouth every morning. Take on an empty stomach     magnesium oxide 400 MG tablet  Commonly known as:  MAG-OX  Take 400 mg by mouth 2 (two) times daily.     nebivolol 10 MG tablet  Commonly known as:  BYSTOLIC  Take 10 mg by mouth 2 (two) times daily.     oxyCODONE-acetaminophen  5-325 MG per tablet  Commonly known as:  PERCOCET/ROXICET  Take 1-2 tablets by mouth every 4 (four) hours as needed for pain.     pantoprazole 40 MG tablet  Commonly known as:  PROTONIX  Take 1 tablet (40 mg total) by mouth daily. Take 30-60 min before first meal of the day     pravastatin 40 MG tablet  Commonly known as:  PRAVACHOL  Take 1 tablet (40 mg total) by mouth daily.     saccharomyces boulardii 250 MG capsule  Commonly known as:  FLORASTOR  Take 1 capsule (250 mg total) by mouth 2 (two) times daily.     senna-docusate 8.6-50 MG per tablet  Commonly known as:  SENOKOT S  Take 1 tablet by mouth 2 (two) times daily.     sodium bicarbonate 650 MG tablet  Take 1 tablet (650 mg total) by mouth 2 (two) times daily.     sodium hypochlorite 0.125 % Soln  Commonly known as:  DAKIN'S 1/4 STRENGTH  Irrigate with 1 application as directed daily as needed. Apply to wound with dressing changes     terbinafine 1 % cream  Commonly known as:  LAMISIL  Apply 1 application topically daily. Applies under stomach     vitamin C 500 MG tablet  Commonly known as:  ASCORBIC ACID  Take 500 mg by mouth daily.     zinc sulfate 220 MG capsule  Take 220 mg by mouth daily.       Allergies  Allergen Reactions  . Amoxicillin-Pot Clavulanate Diarrhea  . Rosiglitazone Maleate     REACTION: swelling  . Amoxicillin Rash    Tolerated Zosyn 01/2013.  [name]       Follow-up Information   Follow up with [facility]. (home health nurse, physical therapy, occupational therapy, and aide)    Contact information:   [phone]      Follow up with EARLY, [name], MD In 4 weeks. (Office will call you to arrange your appt (sent))    Specialty:  Vascular Surgery   Contact information:   [address] [place] [address] [phone]       Follow up with Estanislado Emms, MD. (basic metabolic panel in 1 week.)    Specialty:  Nephrology   Contact information:   [place] [address] [phone]        The results of significant diagnostics from this hospitalization (including imaging, microbiology, ancillary and laboratory) are listed below for reference.    Significant Diagnostic Studies: Dg Chest 2 View  07/16/2013   *RADIOLOGY REPORT*  Clinical Data: Cough with shortness of breath and congestion for 2 weeks.  CHEST - 2 VIEW  Comparison: 06/13/2013.  Findings: Cardiomegaly.  Borderline congestive heart failure/pulmonary vascular congestion.  No effusion or pneumothorax.  No focal infiltrates.  Mild subsegmental atelectasis.  Similar appearance to priors.  IMPRESSION: Cardiomegaly with borderline congestive heart failure.   Original Report Authenticated By: Rolla Flatten, M.D.   US Renal  07/19/2013   *RADIOLOGY REPORT*  Clinical Data: Recent lithotripsy.  Rising creatinine.  Concern for obstruction.  RENAL/URINARY TRACT ULTRASOUND COMPLETE  Comparison:  07/02/2013  Findings:  Right Kidney:  Mild cortical thinning and increased renal parenchymal echogenicity.  No renal mass.  No hydronephrosis. Right kidney measures 9.9 cm in length.  Left Kidney:  Mild cortical thinning and increased renal parenchymal echogenicity.  No renal mass.  No hydronephrosis. Left kidney measures 10 cm in length  Bladder:  Bladder not distended and therefore not well evaluated.  Additional findings:  Ascites similar to the prior exam.  IMPRESSION: Findings consistent with medical renal disease with increased renal parenchymal echogenicity.  No hydronephrosis.   Original Report Authenticated By: Lajean Manes, M.D.   US Renal  07/02/2013   *RADIOLOGY REPORT*  Clinical Data: Stage III kidney disease.  RENAL/URINARY TRACT ULTRASOUND COMPLETE  Comparison:  01/24/2013.  Findings:  Right Kidney:  12.1 cm in length.  Normal renal cortical thickness with mild diffuse increased echogenicity  suggesting medical renal disease.  No hydronephrosis, renal mass or renal calculi.  Left  Kidney:  11.2 cm in length.  Normal renal cortical thickness with slight increased echogenicity suggesting medical renal disease.  No hydronephrosis, renal mass or renal calculi.  Bladder:  Normal  Additional findings:  Abdominal ascites.  IMPRESSION:  1.  Increased echogenicity of both kidneys suggesting medical renal disease. 2.  No hydronephrosis. 3.  Normal bladder. 4.  Abdominal ascites.   Original Report Authenticated By: Marijo Sanes, M.D.    Microbiology: No results found for this or any previous visit (from the past 240 hour(s)).   Labs: Basic Metabolic Panel:  Recent Labs Lab 07/24/13 0421 07/25/13 0600 07/26/13 0805 07/27/13 0535 07/28/13 0450 07/29/13 0851 07/30/13 0520  NA 142 141 138 139 139 136 140  K 4.4 4.7 4.7 4.6 4.6 4.5 4.6  CL 108 106 105 104 105 103 105  CO2 23 20 20 22 22 20 19   GLUCOSE 108* 99 125* 117* 113* 99 101*  BUN 76* 79* 82* 86* 92* 94* 97*  CREATININE 6.01* 6.00* 6.32* 6.62* 6.77* 6.98* 6.86*  CALCIUM 9.0 9.1 8.8 8.8 8.8 8.9 8.9  MG 2.5 2.6* 2.6* 2.6*  --   --   --   PHOS 4.9* 4.8* 5.5* 5.4* 5.3* 5.2* 5.4*   Liver Function Tests:  Recent Labs Lab 07/26/13 0805 07/27/13 0535 07/28/13 0450 07/29/13 0851 07/30/13 0520  AST  --   --   --  8  --   ALT  --   --   --  <5  --   ALKPHOS  --   --   --  87  --   BILITOT  --   --   --  0.4  --   PROT  --   --   --  8.6*  --   ALBUMIN 2.3* 2.2* 2.1* 2.2* 2.2*   No results found for this basename: LIPASE, AMYLASE,  in the last 168 hours No results found for this basename: AMMONIA,  in the last 168 hours CBC:  Recent Labs Lab 07/26/13 0805 07/27/13 0535 07/28/13 0450 07/29/13 0851 07/30/13 0520  WBC 15.3* 14.1* 14.7* 16.8* 16.2*  NEUTROABS 12.6* 11.5* 12.0* 14.4* 13.5*  HGB 11.0* 10.1* 10.2* 11.0* 10.8*  HCT 37.3 34.9* 34.8* 35.5* 36.5  MCV 91.9 92.1 91.8 89.0 90.1  PLT 247 197 190 213 218   Cardiac Enzymes: No results found for this basename: CKTOTAL, CKMB, CKMBINDEX, TROPONINI,  in  the last 168 hours BNP: BNP (last 3 results)  Recent Labs  08/16/12 2049 11/10/12 1724 07/16/13 1522  PROBNP 26806.0* 33908.0* 29655.0*   CBG:  Recent Labs Lab 07/29/13 0602 07/29/13 1229 07/29/13 1624 07/29/13 2143 07/30/13 0554  GLUCAP 99 104* 122* 126* 90       Signed:  Charlynne Cousins  [facility] 07/30/2013, 8:18 AM

## 2013-07-30 NOTE — Progress Notes (Signed)
Pt and mother informed me at this time that she would need a bariatric hospital bed delievered to her home before she could be discharged.  I was present at bedside when the care manager spoke with her this morning to ascertain her equipment needs and a bed was not mentioned at that time.  Called case manager and left a voicemail requesting her to check with family.  Awaiting renal ultrasound results prior to discharge.  Coolidge Breeze, RN 07/30/2013

## 2013-07-30 NOTE — Progress Notes (Signed)
Transporters at bedside to take pt to ultrasound.  Pt on a bariatric air mattress which would need to be unplugged for transport to radiology.  Pt unable to tolerate mattress without air flow.  Ultrasound notified, will need to do renal ultrasound at bedside.  Pt made aware of change, agrees to wait for [place] tech to become available for bedside testing.  Dr. Olevia Bowens notified in person.  Coolidge Breeze, RN 07/30/2013

## 2013-07-31 NOTE — Progress Notes (Signed)
Utilization Review Completed.   Mylinda Brook, RN, BSN Nurse Case Manager  [phone]  

## 2013-08-05 ENCOUNTER — Telehealth: Payer: Self-pay | Admitting: Internal Medicine

## 2013-08-05 MED ORDER — NEBIVOLOL HCL 10 MG PO TABS: 10.0000 mg | ORAL_TABLET | Freq: Two times a day (BID) | ORAL | Status: DC

## 2013-08-05 NOTE — Telephone Encounter (Signed)
Spoke with the pt's daughter She states that the pt needs refill on bystolic  I advised that we can refill this, but she needs appt  Ov with MW for 08/14/13 and 1 mo supply med sent to pharm  Nothing further needed per daughter

## 2013-08-14 ENCOUNTER — Ambulatory Visit (INDEPENDENT_AMBULATORY_CARE_PROVIDER_SITE_OTHER): Payer: BC Managed Care – PPO | Admitting: Internal Medicine

## 2013-08-14 ENCOUNTER — Encounter: Payer: Self-pay | Admitting: Internal Medicine

## 2013-08-14 VITALS — BP 102/60 | HR 55 | Temp 98.7°F | Ht 66.5 in | Wt 354.0 lb

## 2013-08-14 DIAGNOSIS — Z23 Encounter for immunization: Secondary | ICD-10-CM

## 2013-08-14 DIAGNOSIS — R0602 Shortness of breath: Secondary | ICD-10-CM

## 2013-08-14 NOTE — Patient Instructions (Addendum)
Unless your symptoms change you are cleared for kidney surgery if needed  The pepcid at bedtime is to help with night time acid that may be making you cough but can be taken as needed if night time cough recurs  Pulmonary follow up is as needed

## 2013-08-14 NOTE — Progress Notes (Signed)
Subjective:    Patient ID: [name], female    DOB: 07/04/1961  MRN: [id number]    Brief patient profile:  23 yobf perfectly healthy as child minimal smoking in the 1980's with morbid obesity referred by Dr Jasmine December for preop pulmonary clearance for kidney stone removal.   History of Present Illness  07/09/2013 1st pulmonary eval cc doe with exertion but could go up steps p d/c fromr cone 2014 but then much more  Sob with desats after surgery 7/28  With General anesthesia for cysto and stent placement  assoc with min cough still can do steps but have to stop half way up one flight, worse when talk assoc with hoarseness and sometimes a wheezing sound. Tried "some inhalers" no better rec bystolic 10 mg twice daily in place of coreg Pantoprazole (protonix) 40 mg  Take 30-60 min before first meal of the day and Pepcid 20 mg one bedtime until return to office   GERD  Diet .   08/14/2013 f/u ov/Wert re: sob and cough / using IS/ no need for kidney surgery at present Chief Complaint  Patient presents with  . Follow-up    Breathing is doing much better and her cough has resolved. No new co's today.   up 2 flights of steps had to stop, room to room sleeps flat  "wheezing sound" has resolved.  No obvious daytime variabilty or assoc   cough or cp or chest tightness, subjective wheeze overt sinus or hb symptoms. No unusual exp hx or h/o childhood pna/ asthma or knowledge of premature birth.    Sleeping ok at 30 degrees without nocturnal  or early am exacerbation  of respiratory  c/o's or need for noct saba. Also denies any obvious fluctuation of symptoms with weather or environmental changes or other aggravating or alleviating factors except as outlined above   Current Medications, Allergies, Complete Past Medical History, Past Surgical History, Family History, and Social History were reviewed in [employer] record.  ROS  The following are not active complaints unless  bolded sore throat, dysphagia, dental problems, itching, sneezing,  nasal congestion or excess/ purulent secretions, ear ache,   fever, chills, sweats, unintended wt loss, pleuritic or exertional cp, hemoptysis,  orthopnea pnd or leg swelling, presyncope, palpitations, heartburn, abdominal pain, anorexia, nausea, vomiting, diarrhea  or change in bowel or urinary habits, change in stools or urine, dysuria,hematuria,  rash, arthralgias, visual complaints, headache, numbness weakness or ataxia or problems with walking or coordination,  change in mood/affect or memory.            Objective:   Physical Exam   Pleasant w/c bound bf no longer with any hoarseness or pseudowheeze  Wt Readings from Last 3 Encounters:  08/14/13 354 lb (160.573 kg)  07/30/13 414 lb 11 oz (188.1 kg)  07/30/13 414 lb 11 oz (188.1 kg)    Wt Readings from Last 3 Encounters:  06/13/13 345 lb 12.8 oz (156.854 kg)  05/26/13 339 lb (153.769 kg)  05/19/13 313 lb (141.976 kg)    HEENT: nl dentition, turbinates, and orophanx. Nl external ear canals without cough reflex   NECK :  without JVD/Nodes/TM/ nl carotid upstrokes bilaterally   LUNGS: no acc muscle use, clear to A and P bilaterally without cough on insp or exp maneuvers   CV:  RRR  no s3 or murmur or increase in P2, no edema   ABD:  Massively obese, soft and nontender with nl excursion in the supine position.  No bruits or organomegaly, bowel sounds nl  MS:  warm without deformities, calf tenderness, cyanosis or clubbing  SKIN: warm and dry without lesions    NEURO:  alert, approp, no deficits    cxr 06/13/13 Stable mild cardiomegaly and central pulmonary vascular congestion  Venous dopplers 07/04/13 - Study was technically difficult due to patient body habitus. - No evidence of deep vein thrombosis involving the visualized veins of the right lower extremity and left lower extremity.     Assessment & Plan:

## 2013-08-14 NOTE — Assessment & Plan Note (Signed)
-   venous dopplers neg 07/04/13   Multifactorial but improved with rx for acid reflux and vol overload > no need for pulmonary f/u at this point  [place] to taper off gerd rx if no overt HB and no flare of cough or sob, hoarseness or pseudowheeze, all of which have resolved    Each maintenance medication was reviewed in detail including most importantly the difference between maintenance and as needed and under what circumstances the prns are to be used.  Please see instructions for details which were reviewed in writing and the patient given a copy.

## 2013-08-25 ENCOUNTER — Encounter: Payer: Self-pay | Admitting: Vascular Surgery

## 2013-08-26 ENCOUNTER — Other Ambulatory Visit: Payer: Self-pay

## 2013-08-26 ENCOUNTER — Ambulatory Visit (HOSPITAL_COMMUNITY)
Admission: RE | Admit: 2013-08-26 | Discharge: 2013-08-26 | Disposition: A | Discharge status: Home / Self Care | Payer: BC Managed Care – PPO | Source: Ambulatory Visit | Attending: Vascular Surgery | Admitting: Vascular Surgery

## 2013-08-26 ENCOUNTER — Ambulatory Visit (INDEPENDENT_AMBULATORY_CARE_PROVIDER_SITE_OTHER): Payer: BC Managed Care – PPO | Admitting: Vascular Surgery

## 2013-08-26 ENCOUNTER — Other Ambulatory Visit: Payer: Self-pay | Admitting: Vascular Surgery

## 2013-08-26 ENCOUNTER — Other Ambulatory Visit: Payer: Self-pay | Admitting: Radiology

## 2013-08-26 ENCOUNTER — Encounter: Payer: Self-pay | Admitting: Vascular Surgery

## 2013-08-26 VITALS — BP 153/70 | HR 90 | Resp 18 | Ht 66.5 in | Wt 336.0 lb

## 2013-08-26 DIAGNOSIS — Z4931 Encounter for adequacy testing for hemodialysis: Secondary | ICD-10-CM

## 2013-08-26 DIAGNOSIS — T82598A Other mechanical complication of other cardiac and vascular devices and implants, initial encounter: Secondary | ICD-10-CM

## 2013-08-26 DIAGNOSIS — N186 End stage renal disease: Secondary | ICD-10-CM
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
## 2013-09-22 ENCOUNTER — Other Ambulatory Visit: Payer: Self-pay | Admitting: *Deleted

## 2013-09-23 ENCOUNTER — Encounter (HOSPITAL_COMMUNITY): Payer: Self-pay | Admitting: Pharmacy Technician

## 2013-09-23 ENCOUNTER — Encounter (HOSPITAL_COMMUNITY): Payer: Self-pay | Admitting: *Deleted

## 2013-09-23 MED ORDER — VANCOMYCIN HCL 10 G IV SOLR
1500.0000 mg | INTRAVENOUS | Status: AC
  Administered 2013-09-24: 1500 mg via INTRAVENOUS
  Filled 2013-09-23: qty 1500

## 2013-09-23 NOTE — Progress Notes (Signed)
Mother, Georgina Peer verified health hx, allergies, meds for pre-op call because Pt is very hard of hearing and has difficulty hearing over the phone. Mrs. Marshell Levan was given time of arrival, meds for pt to take in am and other pre-op instructions.

## 2013-09-24 ENCOUNTER — Encounter (HOSPITAL_COMMUNITY): Payer: BC Managed Care – PPO | Admitting: Anesthesiology

## 2013-09-24 ENCOUNTER — Encounter (HOSPITAL_COMMUNITY)
Admission: RE | Disposition: A | Discharge status: Home / Self Care | Payer: Self-pay | Source: Ambulatory Visit | Attending: Vascular Surgery

## 2013-09-24 ENCOUNTER — Ambulatory Visit (HOSPITAL_COMMUNITY): Payer: BC Managed Care – PPO | Admitting: Anesthesiology

## 2013-09-24 ENCOUNTER — Ambulatory Visit (HOSPITAL_COMMUNITY): Payer: BC Managed Care – PPO

## 2013-09-24 ENCOUNTER — Ambulatory Visit (HOSPITAL_COMMUNITY)
Admission: RE | Admit: 2013-09-24 | Discharge: 2013-09-24 | Disposition: A | Discharge status: Home / Self Care | Payer: BC Managed Care – PPO | Source: Ambulatory Visit | Attending: Vascular Surgery | Admitting: Vascular Surgery

## 2013-09-24 ENCOUNTER — Encounter (HOSPITAL_COMMUNITY): Payer: Self-pay | Admitting: *Deleted

## 2013-09-24 DIAGNOSIS — N179 Acute kidney failure, unspecified: Secondary | ICD-10-CM

## 2013-09-24 DIAGNOSIS — I82C19 Acute embolism and thrombosis of unspecified internal jugular vein: Secondary | ICD-10-CM | POA: Insufficient documentation

## 2013-09-24 DIAGNOSIS — N186 End stage renal disease: Secondary | ICD-10-CM | POA: Insufficient documentation

## 2013-09-24 DIAGNOSIS — H9190 Unspecified hearing loss, unspecified ear: Secondary | ICD-10-CM | POA: Insufficient documentation

## 2013-09-24 DIAGNOSIS — I509 Heart failure, unspecified: Secondary | ICD-10-CM | POA: Insufficient documentation

## 2013-09-24 DIAGNOSIS — K219 Gastro-esophageal reflux disease without esophagitis: Secondary | ICD-10-CM | POA: Insufficient documentation

## 2013-09-24 DIAGNOSIS — E669 Obesity, unspecified: Secondary | ICD-10-CM | POA: Insufficient documentation

## 2013-09-24 DIAGNOSIS — Z794 Long term (current) use of insulin: Secondary | ICD-10-CM | POA: Insufficient documentation

## 2013-09-24 DIAGNOSIS — E119 Type 2 diabetes mellitus without complications: Secondary | ICD-10-CM | POA: Insufficient documentation

## 2013-09-24 DIAGNOSIS — G4730 Sleep apnea, unspecified: Secondary | ICD-10-CM | POA: Insufficient documentation

## 2013-09-24 DIAGNOSIS — Z87891 Personal history of nicotine dependence: Secondary | ICD-10-CM | POA: Insufficient documentation

## 2013-09-24 DIAGNOSIS — I120 Hypertensive chronic kidney disease with stage 5 chronic kidney disease or end stage renal disease: Secondary | ICD-10-CM | POA: Insufficient documentation

## 2013-09-24 HISTORY — DX: Sleep apnea, unspecified: G47.30

## 2013-09-24 HISTORY — PX: INSERTION OF DIALYSIS CATHETER: SHX1324

## 2013-09-24 HISTORY — DX: Personal history of sudden cardiac arrest: Z86.74

## 2013-09-24 HISTORY — DX: Unspecified hearing loss, unspecified ear: H91.90

## 2013-09-24 LAB — POCT I-STAT 4, (NA,K, GLUC, HGB,HCT)
Glucose, Bld: 100 mg/dL — ABNORMAL HIGH (ref 70–99)
HCT: 31 % — ABNORMAL LOW (ref 36.0–46.0)
Sodium: 142 mEq/L (ref 135–145)

## 2013-09-24 LAB — GLUCOSE, CAPILLARY
Glucose-Capillary: 103 mg/dL — ABNORMAL HIGH (ref 70–99)
Glucose-Capillary: 98 mg/dL (ref 70–99)

## 2013-09-24 SURGERY — INSERTION OF DIALYSIS CATHETER
Anesthesia: Monitor Anesthesia Care | Site: Neck | Wound class: Clean

## 2013-09-24 MED ORDER — FENTANYL CITRATE 0.05 MG/ML IJ SOLN
INTRAMUSCULAR | Status: DC | PRN
  Administered 2013-09-24: 50 ug via INTRAVENOUS

## 2013-09-24 MED ORDER — FENTANYL CITRATE 0.05 MG/ML IJ SOLN: 25.0000 ug | INTRAMUSCULAR | Status: DC | PRN

## 2013-09-24 MED ORDER — SODIUM CHLORIDE 0.9 % IR SOLN
Status: DC | PRN
  Administered 2013-09-24: 1000 mL

## 2013-09-24 MED ORDER — FENTANYL CITRATE 0.05 MG/ML IJ SOLN: 50.0000 ug | Freq: Once | INTRAMUSCULAR | Status: DC

## 2013-09-24 MED ORDER — LIDOCAINE-EPINEPHRINE 0.5 %-1:200000 IJ SOLN
INTRAMUSCULAR | Status: AC
  Filled 2013-09-24: qty 1

## 2013-09-24 MED ORDER — HEPARIN SODIUM (PORCINE) 1000 UNIT/ML IJ SOLN
INTRAMUSCULAR | Status: AC
  Filled 2013-09-24: qty 1

## 2013-09-24 MED ORDER — HEPARIN SODIUM (PORCINE) 1000 UNIT/ML IJ SOLN
INTRAMUSCULAR | Status: DC | PRN
  Administered 2013-09-24: 5 mL via INTRAVENOUS

## 2013-09-24 MED ORDER — MIDAZOLAM HCL 2 MG/2ML IJ SOLN: 1.0000 mg | INTRAMUSCULAR | Status: DC | PRN

## 2013-09-24 MED ORDER — LIDOCAINE-EPINEPHRINE 0.5 %-1:200000 IJ SOLN
INTRAMUSCULAR | Status: DC | PRN
  Administered 2013-09-24: 50 mL

## 2013-09-24 MED ORDER — SODIUM CHLORIDE 0.9 % IV SOLN: INTRAVENOUS | Status: DC

## 2013-09-24 MED ORDER — SODIUM CHLORIDE 0.9 % IV SOLN
INTRAVENOUS | Status: DC | PRN
  Administered 2013-09-24: 09:00:00 via INTRAVENOUS

## 2013-09-24 MED ORDER — SODIUM CHLORIDE 0.9 % IR SOLN
Status: DC | PRN
  Administered 2013-09-24: 09:00:00

## 2013-09-24 MED ORDER — MIDAZOLAM HCL 5 MG/5ML IJ SOLN
INTRAMUSCULAR | Status: DC | PRN
  Administered 2013-09-24: 1 mg via INTRAVENOUS

## 2013-09-24 MED ORDER — PROPOFOL 10 MG/ML IV BOLUS
INTRAVENOUS | Status: DC | PRN
  Administered 2013-09-24: 140 mg via INTRAVENOUS

## 2013-09-24 MED ORDER — OXYCODONE HCL 5 MG PO TABS: 5.0000 mg | ORAL_TABLET | Freq: Once | ORAL | Status: DC | PRN

## 2013-09-24 MED ORDER — LIDOCAINE HCL (CARDIAC) 20 MG/ML IV SOLN
INTRAVENOUS | Status: DC | PRN
  Administered 2013-09-24: 100 mg via INTRAVENOUS

## 2013-09-24 MED ORDER — OXYCODONE HCL 5 MG/5ML PO SOLN: 5.0000 mg | Freq: Once | ORAL | Status: DC | PRN

## 2013-09-24 SURGICAL SUPPLY — 45 items
BAG DECANTER FOR FLEXI CONT (MISCELLANEOUS) ×2 IMPLANT
CATH CANNON HEMO 15F 50CM (CATHETERS) IMPLANT
CATH CANNON HEMO 15FR 19 (HEMODIALYSIS SUPPLIES) IMPLANT
CATH CANNON HEMO 15FR 23CM (HEMODIALYSIS SUPPLIES) IMPLANT
CATH CANNON HEMO 15FR 31CM (HEMODIALYSIS SUPPLIES) IMPLANT
CATH CANNON HEMO 15FR 32 (HEMODIALYSIS SUPPLIES) IMPLANT
CATH CANNON HEMO 15FR 32CM (HEMODIALYSIS SUPPLIES) ×2 IMPLANT
CLIP LIGATING EXTRA MED SLVR (CLIP) ×2 IMPLANT
CLIP LIGATING EXTRA SM BLUE (MISCELLANEOUS) ×2 IMPLANT
COVER PROBE W GEL 5X96 (DRAPES) IMPLANT
COVER SURGICAL LIGHT HANDLE (MISCELLANEOUS) ×2 IMPLANT
DECANTER SPIKE VIAL GLASS SM (MISCELLANEOUS) ×2 IMPLANT
DRAPE C-ARM 42X72 X-RAY (DRAPES) ×2 IMPLANT
DRAPE CHEST BREAST 15X10 FENES (DRAPES) ×2 IMPLANT
DRSG TEGADERM 4X4.75 (GAUZE/BANDAGES/DRESSINGS) ×1 IMPLANT
GAUZE SPONGE 2X2 8PLY STRL LF (GAUZE/BANDAGES/DRESSINGS) ×1 IMPLANT
GAUZE SPONGE 4X4 16PLY XRAY LF (GAUZE/BANDAGES/DRESSINGS) ×2 IMPLANT
GLOVE BIOGEL PI IND STRL 7.0 (GLOVE) IMPLANT
GLOVE BIOGEL PI INDICATOR 7.0 (GLOVE) ×2
GLOVE SS BIOGEL STRL SZ 7.5 (GLOVE) ×1 IMPLANT
GLOVE SUPERSENSE BIOGEL SZ 7.5 (GLOVE) ×2
GOWN STRL NON-REIN LRG LVL3 (GOWN DISPOSABLE) ×6 IMPLANT
KIT BASIN OR (CUSTOM PROCEDURE TRAY) ×2 IMPLANT
KIT ROOM TURNOVER OR (KITS) ×2 IMPLANT
NDL 18GX1X1/2 (RX/OR ONLY) (NEEDLE) ×1 IMPLANT
NDL HYPO 25GX1X1/2 BEV (NEEDLE) ×1 IMPLANT
NEEDLE 18GX1X1/2 (RX/OR ONLY) (NEEDLE) ×4 IMPLANT
NEEDLE 22X1 1/2 (OR ONLY) (NEEDLE) ×2 IMPLANT
NEEDLE HYPO 25GX1X1/2 BEV (NEEDLE) ×2 IMPLANT
NS IRRIG 1000ML POUR BTL (IV SOLUTION) ×2 IMPLANT
PACK SURGICAL SETUP 50X90 (CUSTOM PROCEDURE TRAY) ×2 IMPLANT
PAD ARMBOARD 7.5X6 YLW CONV (MISCELLANEOUS) ×4 IMPLANT
SOAP 2 % CHG 4 OZ (WOUND CARE) ×2 IMPLANT
SPONGE GAUZE 2X2 STER 10/PKG (GAUZE/BANDAGES/DRESSINGS) ×1
SUT ETHILON 3 0 PS 1 (SUTURE) ×2 IMPLANT
SUT VICRYL 4-0 PS2 18IN ABS (SUTURE) ×2 IMPLANT
SYR 20CC LL (SYRINGE) ×2 IMPLANT
SYR 30ML LL (SYRINGE) IMPLANT
SYR 5ML LL (SYRINGE) ×4 IMPLANT
SYR CONTROL 10ML LL (SYRINGE) ×3 IMPLANT
SYRINGE 10CC LL (SYRINGE) ×2 IMPLANT
TAPE CLOTH SURG 4X10 WHT LF (GAUZE/BANDAGES/DRESSINGS) ×1 IMPLANT
TOWEL OR 17X24 6PK STRL BLUE (TOWEL DISPOSABLE) ×2 IMPLANT
TOWEL OR 17X26 10 PK STRL BLUE (TOWEL DISPOSABLE) ×2 IMPLANT
WATER STERILE IRR 1000ML POUR (IV SOLUTION) ×2 IMPLANT

## 2013-09-24 NOTE — Anesthesia Postprocedure Evaluation (Signed)
  Anesthesia Post-op Note  Patient: [name]  Procedure(s) Performed: Procedure(s): INSERTION OF DIALYSIS CATHETER (N/A)  Patient Location: PACU  Anesthesia Type:General  Level of Consciousness: awake  Airway and Oxygen Therapy: Patient Spontanous Breathing  Post-op Pain: mild  Post-op Assessment: Post-op Vital signs reviewed, Patient's Cardiovascular Status Stable, Respiratory Function Stable, Patent Airway, No signs of Nausea or vomiting and Pain level controlled  Post-op Vital Signs: Reviewed and stable  Complications: No apparent anesthesia complications

## 2013-09-24 NOTE — Progress Notes (Signed)
09/24/13 0639  OBSTRUCTIVE SLEEP APNEA  Score 4 or greater  Results sent to PCP

## 2013-09-24 NOTE — Op Note (Signed)
OPERATIVE REPORT  DATE OF SURGERY: 09/24/2013  PATIENT: [name], 52 y.o. female MRN: [id number]  DOB: [birth year]  PRE-OPERATIVE DIAGNOSIS: End-stage renal disease  POST-OPERATIVE DIAGNOSIS:  Same  PROCEDURE: Left IJ hemodialysis catheter with ultrasound visualization  SURGEON:  Curt Jews, M.D.  PHYSICIAN ASSISTANT: Nurse  ANESTHESIA: LMA  EBL: Minimal ml  Total I/O In: 50 [I.V.:50] Out: -   BLOOD ADMINISTERED: None  DRAINS: None  SPECIMEN: None  COUNTS CORRECT:  YES  PLAN OF CARE: PACU with chest x-ray pending   PATIENT DISPOSITION:  PACU - hemodynamically stable  PROCEDURE DETAILS: The patient was taken to the operating room placed supine position where the area of the right neck was imaged with the SonoSite ultrasound. This revealed a patent internal jugular vein. The patient is morbidly obese with sleep apnea and LMA anesthesia was used. The right neck and chest are prepped in the sterile fashion. The right internal jugular vein was accessed with 18-gauge needle using SonoSite ultrasound. No wire would not pass centrally. On further SonoSite imaging was apparent that the right internal jugular vein was occluded at the base of the neck. For this reason the agent was reprepped and draped the left neck. The patient had a widely patent jugular vein on the left. Using SonoSite ultrasound the left internal jugular vein was accessed the guidewire passed easily to the right atrium. This was confirmed with fluoroscopy. A dilator and peel-away sheath were passed over the guidewire and the dilator and guidewire removed. A 27 cm hemodialysis catheter was passed through the peel-away sheath to the level of the distal right atrium. The catheter was brought through a separate stab incision through a separate subcutaneous tunnel. A 2 lm ports were attached in both lumens flushed and aspirated easily and were locked with 1000 unit per cc heparin. The catheter was secured to the skin with  a 3-0 nylon stitch and the entry site was closed with a 4-0 subcuticular Vicryl stitch. Sterile dressing was applied and the patient was taken to the recovery room in stable condition   Curt Jews, M.D. 09/24/2013 10:39 AM

## 2013-09-24 NOTE — Anesthesia Preprocedure Evaluation (Addendum)
Anesthesia Evaluation  Patient identified by MRN, date of birth, ID band Patient awake    Reviewed: Allergy & Precautions, H&P , NPO status , Patient's Chart, lab work & pertinent test results, reviewed documented beta blocker date and time   Airway Mallampati: III TM Distance: >3 FB Neck ROM: Full    Dental  (+) Teeth Intact and Dental Advisory Given   Pulmonary shortness of breath and with exertion, sleep apnea , former smoker,    Pulmonary exam normal + decreased breath sounds      Cardiovascular hypertension, Pt. on medications and Pt. on home beta blockers +CHF Rhythm:Regular Rate:Normal     Neuro/Psych PSYCHIATRIC DISORDERS    GI/Hepatic Neg liver ROS, GERD-  ,  Endo/Other  diabetes, Type 2, Insulin DependentHypothyroidism Morbid obesity  Renal/GU ESRF and DialysisRenal disease     Musculoskeletal   Abdominal   Peds  Hematology   Anesthesia Other Findings   Reproductive/Obstetrics                           Anesthesia Physical Anesthesia Plan  ASA: III  Anesthesia Plan: MAC   Post-op Pain Management:    Induction: Intravenous  Airway Management Planned: LMA  Additional Equipment:   Intra-op Plan:   Post-operative Plan: Extubation in OR  Informed Consent: I have reviewed the patients History and Physical, chart, labs and discussed the procedure including the risks, benefits and alternatives for the proposed anesthesia with the patient or authorized representative who has indicated his/her understanding and acceptance.     Plan Discussed with: CRNA and Surgeon  Anesthesia Plan Comments:        Anesthesia Quick Evaluation

## 2013-09-24 NOTE — Preoperative (Signed)
Beta Blockers   Reason not to administer Beta Blockers:Not Applicable 

## 2013-09-24 NOTE — Transfer of Care (Signed)
Immediate Anesthesia Transfer of Care Note  Patient: [name]  Procedure(s) Performed: Procedure(s): INSERTION OF DIALYSIS CATHETER (N/A)  Patient Location: PACU  Anesthesia Type:MAC and General  Level of Consciousness: sedated, patient cooperative and responds to stimulation  Airway & Oxygen Therapy: Patient Spontanous Breathing and Patient connected to face mask oxygen  Post-op Assessment: Report given to PACU RN, Post -op Vital signs reviewed and stable and Patient moving all extremities  Post vital signs: Reviewed and stable  Complications: No apparent anesthesia complications
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
## 2013-09-24 NOTE — Interval H&P Note (Signed)
History and Physical Interval Note:  09/24/2013 8:21 AM  [name]  has presented today for surgery, with the diagnosis of ESRD  The various methods of treatment have been discussed with the patient and family. After consideration of risks, benefits and other options for treatment, the patient has consented to  Procedure(s): INSERTION OF DIALYSIS CATHETER (N/A) as a surgical intervention .  The patient's history has been reviewed, patient examined, no change in status, stable for surgery.  I have reviewed the patient's chart and labs.  Questions were answered to the patient's satisfaction.     EARLY, [name]

## 2013-09-25 ENCOUNTER — Encounter (HOSPITAL_COMMUNITY): Payer: Self-pay | Admitting: Vascular Surgery

## 2013-10-02 ENCOUNTER — Other Ambulatory Visit: Payer: Self-pay | Admitting: Urology

## 2013-10-17 ENCOUNTER — Ambulatory Visit: Payer: BC Managed Care – PPO | Admitting: Cardiology

## 2013-10-21 ENCOUNTER — Other Ambulatory Visit: Payer: Self-pay | Admitting: Internal Medicine

## 2013-10-22 ENCOUNTER — Encounter (HOSPITAL_COMMUNITY): Payer: Self-pay | Admitting: *Deleted

## 2013-10-22 NOTE — Progress Notes (Signed)
Patient started dialysis on approximately 09/24/13.  Patient has fistula in left arm( not healed yet per mother).  Patient has dialysis catheter in left neck per mother.  Patient has 2 hearing aids.   Patient has not been officialy diagnosed with sleep apnea per mother but did use CPAP machine while in hospital with hospitalization of 08/2013.  Patient does not walk long distances , uses wheelchair.   Obtained medical history information and medical information for surgery 11/06/13 from mother- Georgina Peer.  Instructed mother we would call back and speak with her after 10/30/2013 cardiology appointment with instructions.

## 2013-10-30 ENCOUNTER — Ambulatory Visit (INDEPENDENT_AMBULATORY_CARE_PROVIDER_SITE_OTHER): Payer: BC Managed Care – PPO | Admitting: Cardiology

## 2013-10-30 ENCOUNTER — Encounter: Payer: Self-pay | Admitting: Cardiology

## 2013-10-30 VITALS — BP 160/83 | HR 58 | Ht 66.0 in | Wt 328.0 lb

## 2013-10-30 DIAGNOSIS — I5032 Chronic diastolic (congestive) heart failure: Secondary | ICD-10-CM

## 2013-10-30 DIAGNOSIS — I428 Other cardiomyopathies: Secondary | ICD-10-CM

## 2013-10-30 DIAGNOSIS — I420 Dilated cardiomyopathy: Secondary | ICD-10-CM | POA: Insufficient documentation

## 2013-10-30 DIAGNOSIS — I10 Essential (primary) hypertension: Secondary | ICD-10-CM

## 2013-10-30 DIAGNOSIS — I509 Heart failure, unspecified: Secondary | ICD-10-CM

## 2013-10-30 NOTE — Progress Notes (Signed)
[facility], [address] [place], [place]  [address] Phone: [phone] Fax:  [phone]  Date:  10/30/2013   ID:  [name], DOB 1961/09/22, MRN [id number]  PCP:  [name], MD  Cardiologist:  Fransico Him, MD     History of Present Illness: [name] is a 52 y.o. female with a history of DCM now with normalized EF on echo 12/13, obesity, chronic diastolic CHF, HTN who presents today for followup.  She is doing well from a cardiac standpoint and denies any chest pain, SOB, DOE, dizziness, palpitations or syncope. She has chronic LE edema which is intermittent and usually comes on if sitting for prolonged periods of time.     Wt Readings from Last 3 Encounters:  10/30/13 328 lb (148.78 kg)  08/26/13 336 lb (152.409 kg)  08/14/13 354 lb (160.573 kg)     Past Medical History  Diagnosis Date  . HTN (hypertension)   . Iron deficiency anemia   . Morbid obesity   . Hyperkalemia   . Hypothyroidism   . Hydradenitis   . Pneumonia Nov 10, 2012  . History of hemodialysis dec 2013    none currently  . Urinary tract infection     taking antibiotics for 3 days prior to surgery  . Chronic kidney disease (CKD), stage III (moderate)     [name] nephrology lov note 05-12-2013 on pt chart  . GERD (gastroesophageal reflux disease)   . H/O cardiac arrest 10/2012  . Hard of hearing 10/2012    now wears 2 hearing aids  . Depression   . SOB (shortness of breath)     hx of   . Sleep apnea     no test yet per mother 10/22/13   . DM type 2 (diabetes mellitus, type 2)     diet controlledwith retinopathy and nephropathy  . Multinodular goiter   . Hyperlipidemia   . DCM (dilated cardiomyopathy)     nonischemic    Current Outpatient Prescriptions  Medication Sig Dispense Refill  . aspirin EC 81 MG tablet Take 81 mg by mouth every morning.       . calcitRIOL (ROCALTROL) 0.25 MCG capsule Take 1 capsule (0.25 mcg total) by mouth daily.  30 capsule  0  . calcium carbonate  (OS-CAL - DOSED IN MG OF ELEMENTAL CALCIUM) 1250 MG tablet Take 1 tablet by mouth 2 (two) times daily.      . cyclobenzaprine (FLEXERIL) 10 MG tablet Take 1 tablet (10 mg total) by mouth 3 (three) times daily as needed for muscle spasms. For Spasms  30 tablet  0  . DULoxetine (CYMBALTA) 60 MG capsule Take 1 capsule (60 mg total) by mouth daily.  30 capsule  0  . famotidine (PEPCID) 20 MG tablet Take 20 mg by mouth at bedtime.      . famotidine (PEPCID) 20 MG tablet TAKE 1 TABLET BY MOUTH BEDTIME  30 tablet  0  . feeding supplement (RESOURCE BREEZE) LIQD Take 1 Container by mouth 2 (two) times daily between meals.      . ferrous sulfate 325 (65 FE) MG tablet Take 1 tablet (325 mg total) by mouth 2 (two) times daily.  60 tablet  0  . folic acid (FOLVITE) 1 MG tablet Take 1 tablet (1 mg total) by mouth daily.  30 tablet  0  . furosemide (LASIX) 80 MG tablet Take 2 tablets (160 mg total) by mouth 2 (two) times daily.  30 tablet  0  .  hyoscyamine (LEVSIN, ANASPAZ) 0.125 MG tablet Take 1 tablet (0.125 mg total) by mouth every 4 (four) hours as needed for cramping (bladder spasms).  40 tablet  4  . isosorbide mononitrate (IMDUR) 30 MG 24 hr tablet Take 30 mg by mouth every morning.      Marland Kitchen levothyroxine (SYNTHROID, LEVOTHROID) 25 MCG tablet Take 50 mcg by mouth every morning. Take on an empty stomach      . magnesium oxide (MAG-OX) 400 MG tablet Take 400 mg by mouth 2 (two) times daily.      . nebivolol (BYSTOLIC) 10 MG tablet Take 1 tablet (10 mg total) by mouth 2 (two) times daily.  60 tablet  0  . oxyCODONE-acetaminophen (PERCOCET/ROXICET) 5-325 MG per tablet Take 1-2 tablets by mouth every 4 (four) hours as needed for pain. Patient has not taken since started dialysis 08/2013 due to causes hypotension      . pantoprazole (PROTONIX) 40 MG tablet Take 1 tablet (40 mg total) by mouth daily. Take 30-60 min before first meal of the day  30 tablet  2  . pravastatin (PRAVACHOL) 40 MG tablet Take 40 mg by mouth  daily. Patient takes in am      . saccharomyces boulardii (FLORASTOR) 250 MG capsule Take 1 capsule (250 mg total) by mouth 2 (two) times daily.  30 capsule  0  . sodium bicarbonate 650 MG tablet Take 1 tablet (650 mg total) by mouth 2 (two) times daily.  60 tablet  0  . sodium hypochlorite (DAKIN'S 1/4 STRENGTH) 0.125 % SOLN Irrigate with 1 application as directed 2 (two) times daily. Apply to wound with dressing changes      . terbinafine (LAMISIL) 1 % cream Apply 1 application topically daily. Applies under stomach      . vitamin C (ASCORBIC ACID) 500 MG tablet Take 500 mg by mouth daily.      Marland Kitchen zinc sulfate 220 MG capsule Take 220 mg by mouth daily.       No current facility-administered medications for this visit.    Allergies:    Allergies  Allergen Reactions  . Amoxicillin-Pot Clavulanate Diarrhea  . Rosiglitazone Maleate Swelling    AVANDIA  . Amoxicillin Rash    Tolerated Zosyn 01/2013.  [place]    Social History:  The patient  reports that she quit smoking about 33 years ago. Her smoking use included Cigarettes. She has a .25 pack-year smoking history. She has never used smokeless tobacco. She reports that she does not drink alcohol or use illicit drugs.   Family History:  The patient's family history includes Cancer in her maternal grandfather; Coronary artery disease in her father and mother; Diabetes type II in her mother; Hypertension in her father and mother; Kidney failure in her maternal grandmother; Malignant hyperthermia in her father and mother.   ROS:  Please see the history of present illness.      All other systems reviewed and negative.   PHYSICAL EXAM: VS:  BP 160/83  Pulse 58  Ht 5\' 6"  (1.676 m)  Wt 328 lb (148.78 kg)  BMI 52.97 kg/m2 Well nourished, well developed, in no acute distress HEENT: normal Neck: no JVD Cardiac:  normal S1, S2; RRR; no murmur Lungs:  clear to auscultation bilaterally, no wheezing, rhonchi or rales Abd: soft, nontender, no  hepatomegaly Ext: no edema Skin: warm and dry Neuro:  CNs 2-12 intact, no focal abnormalities noted       ASSESSMENT AND PLAN:  1. Chronic diastolic  CHF - her SOB has improved after starting HD and appears compensated on exam  - continue Bystolic 2. HTN - mildly elevated on exam today  - continue Bystolic 3. DCM - EF low normal at 48% by stress myoview 04/2013  - continue Imdur  - Hydralazine stopped in HD - will leave decision of restarting hydralazine to Dr. Florene Glen  Followup with me in 1 year  Signed, Fransico Him, MD 10/30/2013 1:33 PM

## 2013-10-30 NOTE — Patient Instructions (Signed)
Your physician recommends that you continue on your current medications as directed. Please refer to the Current Medication list given to you today.  Your physician wants you to follow-up in: 6 Months with Dr Turner You will receive a reminder letter in the mail two months in advance. If you don't receive a letter, please call our office to schedule the follow-up appointment.  

## 2013-11-04 ENCOUNTER — Ambulatory Visit: Payer: BC Managed Care – PPO | Admitting: Vascular Surgery

## 2013-11-04 NOTE — Progress Notes (Signed)
Completed preop instructions with mother- Georgina Peer.  Mother reported patient has had cough since 11/03/2013.  Patient has been using Tussin cough syrup for diabetics along with Ricola cough drops.  Instructed mother to inform Dr Jasmine December.  She stated she would call them today.  Mother also reported patient still using temporary dialysis catheter in neck for dialysis.  Attempt was made to use fistula in left arm but unsuccessful per mother.  Patient goes to [facility] dialysis center on [address]

## 2013-11-04 NOTE — Progress Notes (Signed)
Stress Test 05/16/13 in [facility].  EF 48%.  Dr Melvyn Novas last office visit 08/14/13 [facility] ECHO 07/17/13- EF 50-55%.   LOV with Dr Fransico Him- 10/30/13 in [facility]

## 2013-11-06 ENCOUNTER — Ambulatory Visit (HOSPITAL_COMMUNITY): Payer: BC Managed Care – PPO | Admitting: Anesthesiology

## 2013-11-06 ENCOUNTER — Encounter (HOSPITAL_COMMUNITY): Payer: BC Managed Care – PPO | Admitting: Anesthesiology

## 2013-11-06 ENCOUNTER — Observation Stay (HOSPITAL_COMMUNITY)
Admission: RE | Admit: 2013-11-06 | Discharge: 2013-11-07 | Disposition: A | Discharge status: Home / Self Care | Payer: BC Managed Care – PPO | Source: Ambulatory Visit | Attending: Urology | Admitting: Urology

## 2013-11-06 ENCOUNTER — Ambulatory Visit (HOSPITAL_COMMUNITY): Payer: BC Managed Care – PPO

## 2013-11-06 ENCOUNTER — Encounter (HOSPITAL_COMMUNITY): Payer: Self-pay | Admitting: *Deleted

## 2013-11-06 ENCOUNTER — Encounter (HOSPITAL_COMMUNITY)
Admission: RE | Disposition: A | Discharge status: Home / Self Care | Payer: Self-pay | Source: Ambulatory Visit | Attending: Urology

## 2013-11-06 DIAGNOSIS — N200 Calculus of kidney: Principal | ICD-10-CM | POA: Insufficient documentation

## 2013-11-06 DIAGNOSIS — E1139 Type 2 diabetes mellitus with other diabetic ophthalmic complication: Secondary | ICD-10-CM | POA: Insufficient documentation

## 2013-11-06 DIAGNOSIS — J4489 Other specified chronic obstructive pulmonary disease: Secondary | ICD-10-CM | POA: Insufficient documentation

## 2013-11-06 DIAGNOSIS — E11319 Type 2 diabetes mellitus with unspecified diabetic retinopathy without macular edema: Secondary | ICD-10-CM | POA: Insufficient documentation

## 2013-11-06 DIAGNOSIS — G4730 Sleep apnea, unspecified: Secondary | ICD-10-CM | POA: Insufficient documentation

## 2013-11-06 DIAGNOSIS — K219 Gastro-esophageal reflux disease without esophagitis: Secondary | ICD-10-CM | POA: Insufficient documentation

## 2013-11-06 DIAGNOSIS — I129 Hypertensive chronic kidney disease with stage 1 through stage 4 chronic kidney disease, or unspecified chronic kidney disease: Secondary | ICD-10-CM | POA: Insufficient documentation

## 2013-11-06 DIAGNOSIS — Z992 Dependence on renal dialysis: Secondary | ICD-10-CM | POA: Insufficient documentation

## 2013-11-06 DIAGNOSIS — E039 Hypothyroidism, unspecified: Secondary | ICD-10-CM | POA: Insufficient documentation

## 2013-11-06 DIAGNOSIS — E1129 Type 2 diabetes mellitus with other diabetic kidney complication: Secondary | ICD-10-CM | POA: Insufficient documentation

## 2013-11-06 DIAGNOSIS — Z87891 Personal history of nicotine dependence: Secondary | ICD-10-CM | POA: Insufficient documentation

## 2013-11-06 DIAGNOSIS — J449 Chronic obstructive pulmonary disease, unspecified: Secondary | ICD-10-CM

## 2013-11-06 DIAGNOSIS — N183 Chronic kidney disease, stage 3 unspecified: Secondary | ICD-10-CM | POA: Insufficient documentation

## 2013-11-06 DIAGNOSIS — E785 Hyperlipidemia, unspecified: Secondary | ICD-10-CM | POA: Insufficient documentation

## 2013-11-06 DIAGNOSIS — I428 Other cardiomyopathies: Secondary | ICD-10-CM | POA: Insufficient documentation

## 2013-11-06 DIAGNOSIS — Z79899 Other long term (current) drug therapy: Secondary | ICD-10-CM | POA: Insufficient documentation

## 2013-11-06 HISTORY — DX: Depression, unspecified: F32.A

## 2013-11-06 HISTORY — PX: CYSTOSCOPY WITH RETROGRADE PYELOGRAM, URETEROSCOPY AND STENT PLACEMENT: SHX5789

## 2013-11-06 HISTORY — DX: Major depressive disorder, single episode, unspecified: F32.9

## 2013-11-06 LAB — CBC
HCT: 29.6 % — ABNORMAL LOW (ref 36.0–46.0)
MCHC: 29.7 g/dL — ABNORMAL LOW (ref 30.0–36.0)
MCV: 100.7 fL — ABNORMAL HIGH (ref 78.0–100.0)
RDW: 15.1 % (ref 11.5–15.5)

## 2013-11-06 LAB — BASIC METABOLIC PANEL
BUN: 31 mg/dL — ABNORMAL HIGH (ref 6–23)
Creatinine, Ser: 3.89 mg/dL — ABNORMAL HIGH (ref 0.50–1.10)
GFR calc Af Amer: 14 mL/min — ABNORMAL LOW (ref >90)
GFR calc non Af Amer: 12 mL/min — ABNORMAL LOW (ref >90)
Potassium: 3 mEq/L — ABNORMAL LOW (ref 3.5–5.1)
Sodium: 140 mEq/L (ref 135–145)

## 2013-11-06 LAB — GLUCOSE, CAPILLARY

## 2013-11-06 SURGERY — CYSTOURETEROSCOPY, WITH RETROGRADE PYELOGRAM AND STENT INSERTION
Anesthesia: General | Laterality: Right

## 2013-11-06 MED ORDER — SIMVASTATIN 20 MG PO TABS
20.0000 mg | ORAL_TABLET | Freq: Every day | ORAL | Status: DC
  Filled 2013-11-06: qty 1

## 2013-11-06 MED ORDER — FENTANYL CITRATE 0.05 MG/ML IJ SOLN: 25.0000 ug | INTRAMUSCULAR | Status: DC | PRN

## 2013-11-06 MED ORDER — LIDOCAINE HCL 2 % EX GEL
CUTANEOUS | Status: AC
  Filled 2013-11-06: qty 10

## 2013-11-06 MED ORDER — OXYCODONE-ACETAMINOPHEN 5-325 MG PO TABS
1.0000 | ORAL_TABLET | ORAL | Status: DC | PRN
  Administered 2013-11-06: 2 via ORAL
  Filled 2013-11-06: qty 2

## 2013-11-06 MED ORDER — CISATRACURIUM BESYLATE 20 MG/10ML IV SOLN
INTRAVENOUS | Status: AC
  Filled 2013-11-06: qty 10

## 2013-11-06 MED ORDER — PROPOFOL 10 MG/ML IV BOLUS
INTRAVENOUS | Status: AC
  Filled 2013-11-06: qty 20

## 2013-11-06 MED ORDER — DULOXETINE HCL 60 MG PO CPEP
60.0000 mg | ORAL_CAPSULE | Freq: Every day | ORAL | Status: DC
  Administered 2013-11-06: 60 mg via ORAL
  Filled 2013-11-06 (×2): qty 1

## 2013-11-06 MED ORDER — CISATRACURIUM BESYLATE (PF) 10 MG/5ML IV SOLN
INTRAVENOUS | Status: DC | PRN
  Administered 2013-11-06: 4 ug via INTRAVENOUS

## 2013-11-06 MED ORDER — PANTOPRAZOLE SODIUM 40 MG PO TBEC
40.0000 mg | DELAYED_RELEASE_TABLET | Freq: Every day | ORAL | Status: DC
  Filled 2013-11-06: qty 1

## 2013-11-06 MED ORDER — SACCHAROMYCES BOULARDII 250 MG PO CAPS
250.0000 mg | ORAL_CAPSULE | Freq: Two times a day (BID) | ORAL | Status: DC
  Administered 2013-11-06: 22:00:00 250 mg via ORAL
  Filled 2013-11-06 (×3): qty 1

## 2013-11-06 MED ORDER — HYOSCYAMINE SULFATE 0.125 MG PO TABS
0.1250 mg | ORAL_TABLET | ORAL | Status: DC | PRN
  Filled 2013-11-06: qty 1

## 2013-11-06 MED ORDER — HEPARIN SODIUM (PORCINE) 5000 UNIT/ML IJ SOLN
5000.0000 [IU] | Freq: Three times a day (TID) | INTRAMUSCULAR | Status: DC
  Administered 2013-11-06 – 2013-11-07 (×2): 5000 [IU] via SUBCUTANEOUS
  Filled 2013-11-06 (×5): qty 1

## 2013-11-06 MED ORDER — ONDANSETRON HCL 4 MG/2ML IJ SOLN
INTRAMUSCULAR | Status: DC | PRN
  Administered 2013-11-06: 4 mg via INTRAVENOUS

## 2013-11-06 MED ORDER — NEOSTIGMINE METHYLSULFATE 1 MG/ML IJ SOLN
INTRAMUSCULAR | Status: AC
  Filled 2013-11-06: qty 10

## 2013-11-06 MED ORDER — NEBIVOLOL HCL 10 MG PO TABS
10.0000 mg | ORAL_TABLET | Freq: Two times a day (BID) | ORAL | Status: DC
  Administered 2013-11-06: 22:00:00 10 mg via ORAL
  Filled 2013-11-06 (×3): qty 1

## 2013-11-06 MED ORDER — CYCLOBENZAPRINE HCL 10 MG PO TABS
10.0000 mg | ORAL_TABLET | Freq: Three times a day (TID) | ORAL | Status: DC | PRN
  Filled 2013-11-06: qty 1

## 2013-11-06 MED ORDER — BELLADONNA ALKALOIDS-OPIUM 16.2-60 MG RE SUPP
RECTAL | Status: AC
  Filled 2013-11-06: qty 1

## 2013-11-06 MED ORDER — DOXYCYCLINE HYCLATE 100 MG PO CAPS: 100.0000 mg | ORAL_CAPSULE | Freq: Two times a day (BID) | ORAL | Status: DC

## 2013-11-06 MED ORDER — ASPIRIN EC 81 MG PO TBEC
81.0000 mg | DELAYED_RELEASE_TABLET | Freq: Every morning | ORAL | Status: DC
  Filled 2013-11-06: qty 1

## 2013-11-06 MED ORDER — DOXYCYCLINE HYCLATE 100 MG PO TABS
100.0000 mg | ORAL_TABLET | Freq: Two times a day (BID) | ORAL | Status: DC
  Administered 2013-11-06: 100 mg via ORAL
  Filled 2013-11-06 (×3): qty 1

## 2013-11-06 MED ORDER — DEXTROSE 5 % IV SOLN
1.0000 g | Freq: Once | INTRAVENOUS | Status: AC
  Administered 2013-11-06: 1 g via INTRAVENOUS
  Filled 2013-11-06: qty 10

## 2013-11-06 MED ORDER — ISOSORBIDE MONONITRATE ER 30 MG PO TB24
30.0000 mg | ORAL_TABLET | Freq: Every morning | ORAL | Status: DC
  Filled 2013-11-06: qty 1

## 2013-11-06 MED ORDER — NEOSTIGMINE METHYLSULFATE 1 MG/ML IJ SOLN
INTRAMUSCULAR | Status: DC | PRN
  Administered 2013-11-06: 5 mg via INTRAVENOUS

## 2013-11-06 MED ORDER — SODIUM CHLORIDE 0.9 % IV SOLN
INTRAVENOUS | Status: DC
  Administered 2013-11-06: 1000 mL via INTRAVENOUS

## 2013-11-06 MED ORDER — SODIUM CHLORIDE 0.9 % IN NEBU
INHALATION_SOLUTION | RESPIRATORY_TRACT | Status: AC
  Filled 2013-11-06: qty 3

## 2013-11-06 MED ORDER — TRAMADOL HCL 50 MG PO TABS: 50.0000 mg | ORAL_TABLET | ORAL | Status: DC

## 2013-11-06 MED ORDER — PHENYLEPHRINE HCL 10 MG/ML IJ SOLN
10.0000 mg | INTRAVENOUS | Status: DC | PRN
  Administered 2013-11-06: 10 ug/min via INTRAVENOUS

## 2013-11-06 MED ORDER — GLYCOPYRROLATE 0.2 MG/ML IJ SOLN
INTRAMUSCULAR | Status: DC | PRN
  Administered 2013-11-06: 1 mg via INTRAVENOUS

## 2013-11-06 MED ORDER — LACTATED RINGERS IV SOLN: INTRAVENOUS | Status: DC

## 2013-11-06 MED ORDER — FENTANYL CITRATE 0.05 MG/ML IJ SOLN
INTRAMUSCULAR | Status: AC
  Filled 2013-11-06: qty 2

## 2013-11-06 MED ORDER — CLONAZEPAM 0.5 MG PO TABS
0.5000 mg | ORAL_TABLET | Freq: Every day | ORAL | Status: DC
  Administered 2013-11-06: 22:00:00 0.5 mg via ORAL
  Filled 2013-11-06: qty 1

## 2013-11-06 MED ORDER — TRAMADOL HCL 50 MG PO TABS
50.0000 mg | ORAL_TABLET | ORAL | Status: DC | PRN
  Administered 2013-11-06: 50 mg via ORAL
  Filled 2013-11-06 (×2): qty 1

## 2013-11-06 MED ORDER — ALBUTEROL SULFATE (5 MG/ML) 0.5% IN NEBU
2.5000 mg | INHALATION_SOLUTION | Freq: Once | RESPIRATORY_TRACT | Status: AC
  Administered 2013-11-06: 2.5 mg via RESPIRATORY_TRACT

## 2013-11-06 MED ORDER — FENTANYL CITRATE 0.05 MG/ML IJ SOLN
INTRAMUSCULAR | Status: DC | PRN
  Administered 2013-11-06: 50 ug via INTRAVENOUS

## 2013-11-06 MED ORDER — IOHEXOL 300 MG/ML  SOLN
INTRAMUSCULAR | Status: DC | PRN
  Administered 2013-11-06: 10 mL

## 2013-11-06 MED ORDER — ONDANSETRON HCL 4 MG/2ML IJ SOLN
INTRAMUSCULAR | Status: AC
  Filled 2013-11-06: qty 2

## 2013-11-06 MED ORDER — SUCCINYLCHOLINE CHLORIDE 20 MG/ML IJ SOLN
INTRAMUSCULAR | Status: AC
  Filled 2013-11-06: qty 1

## 2013-11-06 MED ORDER — PROPOFOL 10 MG/ML IV BOLUS
INTRAVENOUS | Status: DC | PRN
  Administered 2013-11-06: 200 mg via INTRAVENOUS

## 2013-11-06 MED ORDER — LEVOTHYROXINE SODIUM 50 MCG PO TABS
50.0000 ug | ORAL_TABLET | Freq: Every day | ORAL | Status: DC
  Administered 2013-11-07: 08:00:00 50 ug via ORAL
  Filled 2013-11-06 (×2): qty 1

## 2013-11-06 MED ORDER — SODIUM CHLORIDE 0.9 % IR SOLN
Status: DC | PRN
  Administered 2013-11-06: 4000 mL via INTRAVESICAL

## 2013-11-06 MED ORDER — GLYCOPYRROLATE 0.2 MG/ML IJ SOLN
INTRAMUSCULAR | Status: AC
  Filled 2013-11-06: qty 5

## 2013-11-06 MED ORDER — FAMOTIDINE 20 MG PO TABS
20.0000 mg | ORAL_TABLET | Freq: Every day | ORAL | Status: DC
  Administered 2013-11-06: 22:00:00 20 mg via ORAL
  Filled 2013-11-06 (×2): qty 1

## 2013-11-06 MED ORDER — TERBINAFINE HCL 1 % EX CREA
1.0000 "application " | TOPICAL_CREAM | Freq: Every day | CUTANEOUS | Status: DC
  Administered 2013-11-06: 1 via TOPICAL
  Filled 2013-11-06 (×2): qty 12

## 2013-11-06 MED ORDER — LIDOCAINE HCL (CARDIAC) 20 MG/ML IV SOLN
INTRAVENOUS | Status: AC
  Filled 2013-11-06: qty 5

## 2013-11-06 MED ORDER — PHENYLEPHRINE HCL 10 MG/ML IJ SOLN
INTRAMUSCULAR | Status: DC | PRN
  Administered 2013-11-06 (×2): 120 ug via INTRAVENOUS

## 2013-11-06 MED ORDER — SUCCINYLCHOLINE CHLORIDE 20 MG/ML IJ SOLN
INTRAMUSCULAR | Status: DC | PRN
  Administered 2013-11-06: 100 mg via INTRAVENOUS

## 2013-11-06 MED ORDER — PROMETHAZINE HCL 25 MG/ML IJ SOLN: 6.2500 mg | INTRAMUSCULAR | Status: DC | PRN

## 2013-11-06 MED ORDER — MIDAZOLAM HCL 2 MG/2ML IJ SOLN
INTRAMUSCULAR | Status: AC
  Filled 2013-11-06: qty 2

## 2013-11-06 MED ORDER — ALBUTEROL SULFATE (5 MG/ML) 0.5% IN NEBU
INHALATION_SOLUTION | RESPIRATORY_TRACT | Status: AC
  Filled 2013-11-06: qty 0.5

## 2013-11-06 SURGICAL SUPPLY — 30 items
BAG URO CATCHER STRL LF (DRAPE) ×2 IMPLANT
BASKET LASER NITINOL 1.9FR (BASKET) IMPLANT
BASKET STNLS GEMINI 4WIRE 3FR (BASKET) IMPLANT
BASKET ZERO TIP NITINOL 2.4FR (BASKET) ×1 IMPLANT
BSKT STON RTRVL 120 1.9FR (BASKET)
BSKT STON RTRVL GEM 120X11 3FR (BASKET)
BSKT STON RTRVL ZERO TP 2.4FR (BASKET) ×1
CATH CLEAR GEL 3F BACKSTOP (CATHETERS) IMPLANT
CATH ROBINSON RED A/P 16FR (CATHETERS) ×1 IMPLANT
CATH URET 5FR 28IN CONE TIP (BALLOONS)
CATH URET 5FR 28IN OPEN ENDED (CATHETERS) ×1 IMPLANT
CATH URET 5FR 70CM CONE TIP (BALLOONS) IMPLANT
CATH URET DUAL LUMEN 6-10FR 50 (CATHETERS) IMPLANT
DRAPE CAMERA CLOSED 9X96 (DRAPES) ×2 IMPLANT
FIBER LASER FLEXIVA 200 (UROLOGICAL SUPPLIES) IMPLANT
FIBER LASER FLEXIVA 365 (UROLOGICAL SUPPLIES) IMPLANT
GLOVE BIOGEL M 7.0 STRL (GLOVE) ×4 IMPLANT
GOWN PREVENTION PLUS LG XLONG (DISPOSABLE) ×1 IMPLANT
GOWN STRL REIN XL XLG (GOWN DISPOSABLE) ×4 IMPLANT
GUIDEWIRE ANG ZIPWIRE 038X150 (WIRE) IMPLANT
GUIDEWIRE STR DUAL SENSOR (WIRE) ×2 IMPLANT
IV NS IRRIG 3000ML ARTHROMATIC (IV SOLUTION) ×2 IMPLANT
MANIFOLD NEPTUNE II (INSTRUMENTS) ×2 IMPLANT
PACK CYSTO (CUSTOM PROCEDURE TRAY) ×2 IMPLANT
SCRUB PCMX 4 OZ (MISCELLANEOUS) ×2 IMPLANT
SHEATH ACCESS URETERAL 38CM (SHEATH) IMPLANT
SHEATH URET ACCESS 12FR/35CM (UROLOGICAL SUPPLIES) IMPLANT
SHEATH URET ACCESS 12FR/55CM (UROLOGICAL SUPPLIES) IMPLANT
SYRINGE IRR TOOMEY STRL 70CC (SYRINGE) ×1 IMPLANT
TUBING CONNECTING 10 (TUBING) ×2 IMPLANT

## 2013-11-06 NOTE — Transfer of Care (Signed)
Immediate Anesthesia Transfer of Care Note  Patient: [name]  Procedure(s) Performed: Procedure(s) (LRB): CYSTOSCOPY WITH RETROGRADE PYELOGRAM, URETEROSCOPY STONE REMOVAL AND STENT REMOVAL (Right)  Patient Location: PACU  Anesthesia Type: General  Level of Consciousness: sedated, patient cooperative and responds to stimulation  Airway & Oxygen Therapy: Patient Spontanous Breathing and Patient connected to face mask oxgen  Post-op Assessment: Report given to PACU RN and Post -op Vital signs reviewed and stable  Post vital signs: Reviewed and stable  Complications: No apparent anesthesia complications

## 2013-11-06 NOTE — Progress Notes (Signed)
Sats on pulsox 70% when sleeping.  Sats 100 on 2L at rest.  Unable to ambulate due to post op.

## 2013-11-06 NOTE — Progress Notes (Signed)
Dr Marcell Barlow notified: pt pre op sats 92 on room air.  Post-op received 1 albuterol treatment.  Pt awake and alert at 1415, expressed strong desire to go home.   Sats on room air on 70-80 when not stimulated.  Increase to lo 90's with talking and coughing.  Denies SOB, reports status as pre op breathing.  Denies home oxygen.  Placed in upright wheelchair to facilitate breathing.  Dr Marcell Barlow authorized transfer to short stay.

## 2013-11-06 NOTE — Progress Notes (Signed)
Patient has a productive cough. She is coughing up clear mucus. States she did call DR. Woodruff's office to let him know she has a cough. She does want to have her surgery today

## 2013-11-06 NOTE — Progress Notes (Signed)
Dr ROse notified pt on canula for 1 hour.  Started at 4L and weaned to 1 with sats 80-100(ear probe) .  Spouse reports pt at baseline respiratorty status and color(pale Hands).  Dr Kalman Shan authorized pt able to go home.  SAts now 90 on room air.  Pt compliant with coughing and use of Incentive Spirometer, spouse encouraging.

## 2013-11-06 NOTE — Progress Notes (Signed)
Dr Jasmine December notified of room air sats 70% at rest.  Sats 100% on 2L-unable to wean.  Orders received.

## 2013-11-06 NOTE — H&P (Signed)
Urology History and Physical Exam  CC: Right nephrolithiasis.  HPI:  52 year old female presents today for right nephrolithiasis.  This is a chronic problem.  She had previous ureteroscopy in July 2014.  Stones are made of uric acid and calcium oxalate.  Due to the long duration of her indwelling stent prior to this there was a large amount of sediment making visualization poor.  Because of this I elected to place a stent and return in an interval fashion, but her pulmonary function worsened and she was not cleared for surgery.  She returns today for repeat ureteroscopy.  I explained to her my concerns that again it has been a long interval that her stent and left indwelling.  Since her last ureteroscopy she has been placed on dialysis due to chronic renal failure.  She dialyzes Monday-Wednesday-Friday.  Her stone burden on CT scan in December 2013 was a 1 cm stone at the right UPJ and a 6 mm stone within the kidney itself.  Urine culture 09/26/13 revealed Escherichia coli sensitive to doxycycline and Rocephin.  She was started on doxycycline 11/01/13 and will receive Rocephin IV for the procedure.  She has been cleared by her pulmonologist, Dr. Melvyn Novas, for surgery. We have discussed management options and she presents today for cystoscopy, right ureteroscopy, laser lithotripsy, right retrograde program, possible right ureter stent placement.   PMH: Past Medical History  Diagnosis Date  . HTN (hypertension)   . Iron deficiency anemia   . Morbid obesity   . Hyperkalemia   . Hypothyroidism   . Hydradenitis   . Pneumonia Nov 10, 2012  . History of hemodialysis dec 2013    none currently  . Urinary tract infection     taking antibiotics for 3 days prior to surgery  . GERD (gastroesophageal reflux disease)   . H/O cardiac arrest 10/2012  . Hard of hearing 10/2012    now wears 2 hearing aids  . Depression   . SOB (shortness of breath)     hx of   . Sleep apnea     no test yet per mother  10/22/13   . DM type 2 (diabetes mellitus, type 2)     diet controlledwith retinopathy and nephropathy  . Multinodular goiter   . Hyperlipidemia   . DCM (dilated cardiomyopathy)     nonischemic  . Chronic diastolic CHF (congestive heart failure)   . Chronic kidney disease (CKD), stage III (moderate)     [name] nephrology lov note 05-12-2013 on pt chart now on HD    PSH: Past Surgical History  Procedure Laterality Date  . Portacath placement    . Cystoscopy w/ ureteral stent placement  05/28/2012    Procedure: CYSTOSCOPY WITH RETROGRADE PYELOGRAM/URETERAL STENT PLACEMENT;  Surgeon: Reece Packer, MD;  Location: [facility];  Service: Urology;  Laterality: Left;  . Cystoscopy w/ retrogrades  11/16/2012    Procedure: CYSTOSCOPY WITH RETROGRADE PYELOGRAM;  Surgeon: Reece Packer, MD;  Location: [facility];  Service: Urology;  Laterality: Bilateral;  CYSTOSCOPY,BILATERAL RETROGRADE PYELOGRAM/ REMOVAL LEFT URETERAL STENT/ FULGERATION BLADDER MUCOSA/ INSERTION RIGHT URETERAL STENT  . Port-a-cath removal    . Cystoscopy with retrograde pyelogram, ureteroscopy and stent placement Right 06/23/2013    Procedure: CYSTOSCOPY WITH RIGHT URETEROSCOPY, RIGHT  RETROGRADE PYELOGRAM, WITH LASER LIPOTRIPSY AND RIGHT URETERAL STENT EXCHANGE ;  Surgeon: Molli Hazard, MD;  Location: [facility];  Service: Urology;  Laterality: Right;  STENT EXCHANGE    . Holmium laser application N/A [id number]  Procedure: HOLMIUM LASER APPLICATION;  Surgeon: Molli Hazard, MD;  Location: [facility];  Service: Urology;  Laterality: N/A;  . Av fistula placement Left 07/25/2013    Procedure: ARTERIOVENOUS (AV) FISTULA CREATION ;  Surgeon: Rosetta Posner, MD;  Location: [facility];  Service: Vascular;  Laterality: Left;  . Insertion of dialysis catheter N/A 09/24/2013    Procedure: INSERTION OF DIALYSIS CATHETER;  Surgeon: Rosetta Posner, MD;  Location: [facility];  Service: Vascular;  Laterality: N/A;    Allergies: Allergies   Allergen Reactions  . Amoxicillin-Pot Clavulanate Diarrhea  . Rosiglitazone Maleate Swelling    AVANDIA  . Amoxicillin Rash    Tolerated Zosyn 01/2013.  [name]    Medications: Prescriptions prior to admission  Medication Sig Dispense Refill  . doxycycline (VIBRAMYCIN) 100 MG capsule Take 100 mg by mouth 2 (two) times daily. Patient started on 10/31/13 for 10 days      . OVER THE COUNTER MEDICATION ricola cough drops as needed      . PRESCRIPTION MEDICATION Tussin cough syrup for diabetics - Patient takes 2 teaspoons every 6 hours.      Marland Kitchen aspirin EC 81 MG tablet Take 81 mg by mouth every morning.       . calcium carbonate (OS-CAL - DOSED IN MG OF ELEMENTAL CALCIUM) 1250 MG tablet Take 1 tablet by mouth 2 (two) times daily.      . clonazePAM (KLONOPIN) 0.5 MG tablet Take 0.5 mg by mouth at bedtime.       . cyclobenzaprine (FLEXERIL) 10 MG tablet Take 1 tablet (10 mg total) by mouth 3 (three) times daily as needed for muscle spasms. For Spasms  30 tablet  0  . DULoxetine (CYMBALTA) 60 MG capsule Take 1 capsule (60 mg total) by mouth daily.  30 capsule  0  . famotidine (PEPCID) 20 MG tablet TAKE 1 TABLET BY MOUTH BEDTIME  30 tablet  0  . feeding supplement (RESOURCE BREEZE) LIQD Take 1 Container by mouth 2 (two) times daily between meals.      . folic acid (FOLVITE) 1 MG tablet Take 1 tablet (1 mg total) by mouth daily.  30 tablet  0  . hyoscyamine (LEVSIN, ANASPAZ) 0.125 MG tablet Take 1 tablet (0.125 mg total) by mouth every 4 (four) hours as needed for cramping (bladder spasms).  40 tablet  4  . isosorbide mononitrate (IMDUR) 30 MG 24 hr tablet Take 30 mg by mouth every morning.      Marland Kitchen levothyroxine (SYNTHROID, LEVOTHROID) 25 MCG tablet Take 50 mcg by mouth every morning. Take on an empty stomach      . nebivolol (BYSTOLIC) 10 MG tablet Take 1 tablet (10 mg total) by mouth 2 (two) times daily.  60 tablet  0  . oxyCODONE-acetaminophen (PERCOCET/ROXICET) 5-325 MG per tablet Take 1-2 tablets by  mouth every 4 (four) hours as needed for pain. Patient has not taken since started dialysis 08/2013 due to causes hypotension      . pantoprazole (PROTONIX) 40 MG tablet Take 40 mg by mouth daily. Take 30-60 min before first meal of the day  Last dose was on 10/21/2013      . pravastatin (PRAVACHOL) 40 MG tablet Take 40 mg by mouth daily. Patient takes in am      . saccharomyces boulardii (FLORASTOR) 250 MG capsule Take 1 capsule (250 mg total) by mouth 2 (two) times daily.  30 capsule  0  . sodium bicarbonate 650 MG tablet Take 1  tablet (650 mg total) by mouth 2 (two) times daily.  60 tablet  0  . sodium hypochlorite (DAKIN'S 1/4 STRENGTH) 0.125 % SOLN Irrigate with 1 application as directed 2 (two) times daily. Apply to wound with dressing changes      . terbinafine (LAMISIL) 1 % cream Apply 1 application topically daily. Applies under stomach      . vitamin C (ASCORBIC ACID) 500 MG tablet Take 500 mg by mouth daily.      Marland Kitchen zinc sulfate 220 MG capsule Take 220 mg by mouth daily.         Social History: History   Social History  . Marital Status: Married    Spouse Name: N/A    Number of Children: N/A  . Years of Education: N/A   Occupational History  . Not on file.   Social History Main Topics  . Smoking status: Former Smoker -- 0.25 packs/day for 1 years    Types: Cigarettes    Quit date: 11/28/1979  . Smokeless tobacco: Never Used  . Alcohol Use: No  . Drug Use: No  . Sexual Activity: No   Other Topics Concern  . Not on file   Social History Narrative  . No narrative on file    Family History: Family History  Problem Relation Age of Onset  . Coronary artery disease Mother   . Hypertension Mother   . Diabetes type II Mother   . Malignant hyperthermia Mother   . Coronary artery disease Father   . Hypertension Father   . Malignant hyperthermia Father   . Cancer Maternal Grandfather     ? Type  . Kidney failure Maternal Grandmother     Review of  Systems: Positive: Cough. SOB at baseline. Negative: Fever, chills, or chest pain.  A further 10 point review of systems was negative except what is listed in the HPI.  Physical Exam: Filed Vitals:   11/06/13 0844  BP: 141/58  Pulse: 59  Temp: 98.7 F (37.1 C)  Resp: 16    General: No acute distress.  Awake. Head:  Normocephalic.  Atraumatic. ENT:  EOMI.  Mucous membranes moist Neck:  Supple.  No lymphadenopathy. CV:  S1 present. S2 present. Regular rate. Pulmonary: Equal effort bilaterally.   Wheezes bilaterally. Abdomen: Soft.  Non- tender to palpation. Skin:  Normal turgor.  No visible rash. Extremity: No gross deformity of bilateral upper extremities.  No gross deformity of    bilateral lower extremities. Neurologic: Alert. Appropriate mood.    Studies:  No results found for this basename: HGB, WBC, PLT,  in the last 72 hours  No results found for this basename: NA, K, CL, CO2, BUN, CREATININE, CALCIUM, MAGNESIUM, GFRNONAA, GFRAA,  in the last 72 hours   No results found for this basename: PT, INR, APTT,  in the last 72 hours   No components found with this basename: ABG,     Assessment:  Right nephrolithiasis.  Plan: Proceed to the operating room for cystoscopy, right ureteroscopy, laser lithotripsy, right retrograde program, possible right ureter stent placement.

## 2013-11-06 NOTE — Progress Notes (Signed)
Dr Kalman Shan notified pt transferrred to Short Stay and sats 65-70 when sitting undisturbed.  Sats 91 when talking.  Ordered to monitor pt for additional 15 minutes and call MD with update.  PACU  RN remaining at bedside and caring for pt.  Family at bedside and reports pt as per baseline

## 2013-11-06 NOTE — Progress Notes (Signed)
REASSESSED pt while resting.  SATS 70% on room air.  Improved to 100 % on 2L nasal canula.  Dr Kalman Shan notified.  Ordered to contact Dr Jasmine December for admission.

## 2013-11-06 NOTE — Progress Notes (Signed)
Restarted on 4 L canula.

## 2013-11-06 NOTE — Progress Notes (Signed)
Notified by recovery room nurse that patient's oxygen saturation is 73% on room air. She has a known history of COPD. Unable to get home oxygen tonight. Will admit to observation and try to get home health oxygen in the morning. She has outpatient dialysis at 11:00 am, so we'll try to get her set up before then.

## 2013-11-06 NOTE — Progress Notes (Signed)
Case Manager in ED reports unablee to obtain home oxygen with ambulatory surgery.  Dr Jasmine December notified.

## 2013-11-06 NOTE — Progress Notes (Signed)
paientt last had dialysis 11/05/13. She has a hickman cath in her Lt chest. She has a fistula on her Lt arm. She has a bruit in her fistula

## 2013-11-06 NOTE — Op Note (Signed)
Urology Operative Report  Date of Procedure: 11/06/13  Surgeon: Rolan Bucco, MD Assistant:  None  Preoperative Diagnosis: Right nephrolithiasis. Postoperative Diagnosis:  Same  Procedure(s): Cystoscopy Right retrograde pyelogram with interpretation. Right ureteroscopy with basket stone removal. Right ureter stent removal.  Estimated blood loss: None  Specimen: Amorphous stone removed and disposed.  Drains: None  Complications: None  Findings: Sediment in the right kidney. Small stones which were amorphous. Most of which disintegrated upon grasping. Other stones were too small to be removed with a 0 tip basket. No evidence of obstruction on retrograde pyelogram or ureteroscopy.  History of present illness: 52 year old female presents today for right nephrolithiasis. This he calls obstruction and pyelonephritis the past. She presents today for further treatment of her right renal stones.   Procedure in detail: After informed consent was obtained, the patient was taken to the operating room. They were placed in the supine position. SCDs were turned on and in place. IV antibiotics were infused, and general anesthesia was induced. A timeout was performed in which the correct patient, surgical site, and procedure were identified and agreed upon by the team.  The patient was placed in a dorsolithotomy position, making sure to pad all pertinent neurovascular pressure points. The genitals were prepped and draped in the usual sterile fashion.  A rigid cystoscope was advanced through the urethra and into the bladder. The bladder was drained and then fully distended and evaluated in a systematic fashion. This was negative for bladder tumors. Attention was turned to the right ureter orifice. A sensor wire was placed up alongside this indwelling right ureter stent. This was placed into the right renal pelvis and it was secured to the drape as a safety wire. A stent grasper was then used to  remove the right ureter stent.  I obtained a right retrograde pyelogram by inserting a 5 [place] ureter catheter into the right ureter orifice. I injected 10 cc of Omnipaque in order to obtain retrograde pyelogram. There was no hydronephrosis and no filling defects noted. I then advanced this up into the right renal pelvis and injected another 5 cc of Omnipaque. This again revealed no filling defects. There was no evidence of ureteropelvic junction obstruction. I then removed the ureter catheter. There was good drainage of the system.  I then performed ureteroscopy by painting flexible digital ureter scope with ease up the right ureter. This was widely patent without any obstruction. I was able to navigate into all of the renal calyces and most of them contained a large amount of white sediment and debris. Some of them contained a small amount of amorphous stone. I was able to grasp some of the stone and remove it from the kidney and out of the bladder. Most of the stone disintegrated when grasped with a 0 tip Nitinol basket. Other fragments were too small to grasp. All of the calyxes were evaluated and there was no large stone noted other than a large conglomerates of amorphous material which easily broke up and was flushed down the ureter.  I then withdrew the ureter scope and visualized the entire length of the ureter. This was negative for any injury to the ureter mucosa. I elected not to leave a stent as it appeared that her system emptied well. The safety wire was removed. I then used a red Robinson catheter to flush out the amorphous stone from her bladder and the catheter was removed after her bladder was completely drained.  She's placed back in a supine  position, anesthesia was reversed, and she was taken to the [facility] in stable condition.  She will be given a further course of doxycycline to cover for this instrumentation.  All counts were correct at the end of the case.

## 2013-11-06 NOTE — Anesthesia Preprocedure Evaluation (Addendum)
Anesthesia Evaluation  Patient identified by MRN, date of birth, ID band Patient awake    Reviewed: Allergy & Precautions, H&P , NPO status , Patient's Chart, lab work & pertinent test results, reviewed documented beta blocker date and time   Airway Mallampati: II TM Distance: >3 FB Neck ROM: full    Dental no notable dental hx. (+) Dental Advisory Given, Loose, Poor Dentition, Partial Lower and Partial Upper,    Pulmonary neg pulmonary ROS, shortness of breath, former smoker,  Intubated on vent PRVC RR 25 Minute volume about 13, 100% fiO2, PEEP 5 Acute hypoxemic and hypercarbic respiratory failure, bilateral pneumonia, acidosis breath sounds clear to auscultation  Pulmonary exam normal       Cardiovascular Exercise Tolerance: Good hypertension, Pt. on medications +CHF negative cardio ROS  Rhythm:regular Rate:Normal  LVEF 35-40%, HTN   Neuro/Psych PSYCHIATRIC DISORDERS negative neurological ROS  negative psych ROS   GI/Hepatic negative GI ROS, Neg liver ROS, GERD-  ,  Endo/Other  diabetes, Type 2Hypothyroidism Morbid obesity  Renal/GU Renal diseaseRecent septic shock, ARF.  negative genitourinary   Musculoskeletal   Abdominal (+) + obese,   Peds  Hematology negative hematology ROS (+) anemia , Hgb 7.0   Anesthesia Other Findings Risk of dental injury explained to patient and all questions answered.  Reproductive/Obstetrics negative OB ROS                       Anesthesia Physical Anesthesia Plan  ASA: III  Anesthesia Plan: General   Post-op Pain Management:    Induction: Intravenous  Airway Management Planned: Oral ETT  Additional Equipment:   Intra-op Plan:   Post-operative Plan: Extubation in OR  Informed Consent: I have reviewed the patients History and Physical, chart, labs and discussed the procedure including the risks, benefits and alternatives for the proposed anesthesia with  the patient or authorized representative who has indicated his/her understanding and acceptance.   Dental advisory given  Plan Discussed with: CRNA  Anesthesia Plan Comments: (Loose upper incisors noted on physical exam.)     Anesthesia Quick Evaluation

## 2013-11-07 ENCOUNTER — Encounter (HOSPITAL_COMMUNITY): Payer: Self-pay | Admitting: Urology

## 2013-11-07 NOTE — Progress Notes (Signed)
Call from MD this morning for Home O2,  pt's  Sats at room air 73%, with O2 at 2L Sats 100%.  [facility] called for Home O2 to be delivered to pt's room.

## 2013-11-07 NOTE — Progress Notes (Signed)
Discharge instructions explained to mother since patient is very hard of hearing. Mother verbalizes understanding, prescriptions given. [facility] here with O2 tank and explaining usage to mother. Stable for discharge.

## 2013-11-07 NOTE — Discharge Summary (Signed)
Physician Discharge Summary  Patient ID: [name] MRN: [id number] DOB/AGE: [birth year] 52 y.o.  Admit date: 11/06/2013 Discharge date: 11/07/2013  Admission Diagnoses:  Discharge Diagnoses:  Active Problems:   COPD (chronic obstructive pulmonary disease)   Discharged Condition: fair  Hospital Course:  Patient was admitted after surgery as her oxygen saturation was below 90%. She was feeling at baseline, but it was too late to be able to set up home oxygen so she had to be admitted for observation. Case manager was consulted the following morning for home oxygen. She had an uneventful night. She was discharged home on doxycycline.  Consults: Case manager- home oxygen.  Significant Diagnostic Studies: None.  Treatments: surgery: right ureteroscopy and home oxygen.  Discharge Exam: Blood pressure 121/68, pulse 71, temperature 98.9 F (37.2 C), temperature source Oral, resp. rate 22, height 5\' 6"  (1.676 m), weight 149.007 kg (328 lb 8 oz), SpO2 97.00%. Refer to progress note on date of discharge.  Disposition: 01-Home or Self Care  Discharge Orders   Future Orders Complete By Expires   Discharge patient  As directed    Discharge patient  As directed        Medication List    STOP taking these medications       vitamin C 500 MG tablet  Commonly known as:  ASCORBIC ACID      TAKE these medications       aspirin EC 81 MG tablet  Take 81 mg by mouth every morning.     calcium carbonate 1250 MG tablet  Commonly known as:  OS-CAL - dosed in mg of elemental calcium  Take 1 tablet by mouth 2 (two) times daily.     clonazePAM 0.5 MG tablet  Commonly known as:  KLONOPIN  Take 0.5 mg by mouth at bedtime.     cyclobenzaprine 10 MG tablet  Commonly known as:  FLEXERIL  Take 1 tablet (10 mg total) by mouth 3 (three) times daily as needed for muscle spasms. For Spasms     doxycycline 100 MG capsule  Commonly known as:  VIBRAMYCIN  Take 1 capsule (100 mg total) by  mouth 2 (two) times daily. Patient started on 10/31/13 for 10 days     DULoxetine 60 MG capsule  Commonly known as:  CYMBALTA  Take 1 capsule (60 mg total) by mouth daily.     famotidine 20 MG tablet  Commonly known as:  PEPCID  TAKE 1 TABLET BY MOUTH BEDTIME     feeding supplement (RESOURCE BREEZE) Liqd  Take 1 Container by mouth 2 (two) times daily between meals.     folic acid 1 MG tablet  Commonly known as:  FOLVITE  Take 1 tablet (1 mg total) by mouth daily.     hyoscyamine 0.125 MG tablet  Commonly known as:  LEVSIN, ANASPAZ  Take 1 tablet (0.125 mg total) by mouth every 4 (four) hours as needed for cramping (bladder spasms).     isosorbide mononitrate 30 MG 24 hr tablet  Commonly known as:  IMDUR  Take 30 mg by mouth every morning.     levothyroxine 25 MCG tablet  Commonly known as:  SYNTHROID, LEVOTHROID  Take 50 mcg by mouth every morning. Take on an empty stomach     nebivolol 10 MG tablet  Commonly known as:  BYSTOLIC  Take 1 tablet (10 mg total) by mouth 2 (two) times daily.     OVER THE COUNTER MEDICATION  ricola cough drops as needed  oxyCODONE-acetaminophen 5-325 MG per tablet  Commonly known as:  PERCOCET/ROXICET  Take 1-2 tablets by mouth every 4 (four) hours as needed for pain. Patient has not taken since started dialysis 08/2013 due to causes hypotension     pantoprazole 40 MG tablet  Commonly known as:  PROTONIX  Take 40 mg by mouth daily. Take 30-60 min before first meal of the day  Last dose was on 10/21/2013     pravastatin 40 MG tablet  Commonly known as:  PRAVACHOL  Take 40 mg by mouth daily. Patient takes in am     PRESCRIPTION MEDICATION  Tussin cough syrup for diabetics - Patient takes 2 teaspoons every 6 hours.     saccharomyces boulardii 250 MG capsule  Commonly known as:  FLORASTOR  Take 1 capsule (250 mg total) by mouth 2 (two) times daily.     sodium hypochlorite 0.125 % Soln  Commonly known as:  DAKIN'S 1/4 STRENGTH   Irrigate with 1 application as directed 2 (two) times daily. Apply to wound with dressing changes     terbinafine 1 % cream  Commonly known as:  LAMISIL  Apply 1 application topically daily. Applies under stomach     traMADol 50 MG tablet  Commonly known as:  ULTRAM  Take 1 tablet (50 mg total) by mouth every 4 (four) hours.     zinc sulfate 220 MG capsule  Take 220 mg by mouth daily.           Follow-up Information   Follow up with Molli Hazard, MD On 11/13/2013. (11:15 am)    Specialty:  Urology   Contact information:   [place] [address] [phone]       Signed: Molli Hazard 11/07/2013, 6:59 AM

## 2013-11-07 NOTE — Progress Notes (Signed)
Pt assisted up to [facility] to void. Pt denies the urge to void. Pt voided 75ccs of bloody urine. Pt's bladder was scanned which showed greater than 998cc's. Pt refused to be in and out cathed. Explained to patient that it was just to get the urine out of her bladder. She stated that we couldn't do it because it would hurt. NP paged.

## 2013-11-07 NOTE — Progress Notes (Signed)
Urology Progress Note  Subjective:     No acute urologic events overnight. No abdominal pain. Denies increased SOB; states she is at baseline.  ROS: Negative: nausea, emesis.  Objective:  Patient Vitals for the past 24 hrs:  BP Temp Temp src Pulse Resp SpO2 Height Weight  11/07/13 0635 121/68 mmHg 98.9 F (37.2 C) Oral 71 22 97 % - -  11/06/13 2111 123/55 mmHg 99.2 F (37.3 C) Oral 70 18 96 % - -  11/06/13 1815 150/72 mmHg 99.5 F (37.5 C) - 65 - 99 % - -  11/06/13 1730 148/79 mmHg 99.4 F (37.4 C) - 59 16 97 % - -  11/06/13 1629 132/69 mmHg - - - 12 95 % - -  11/06/13 1615 - 98.3 F (36.8 C) - - - 98 % - -  11/06/13 1545 115/62 mmHg - - 56 20 65 % - -  11/06/13 1530 115/59 mmHg 98.7 F (37.1 C) - 57 16 89 % - -  11/06/13 1515 - - - 58 20 87 % - -  11/06/13 1500 95/40 mmHg - - 57 18 87 % - -  11/06/13 1445 124/68 mmHg 98.1 F (36.7 C) - 57 20 97 % - -  11/06/13 1430 118/60 mmHg - - 62 18 94 % - -  11/06/13 1415 122/68 mmHg 98.8 F (37.1 C) - 59 20 100 % - -  11/06/13 1400 123/72 mmHg - - 59 15 100 % - -  11/06/13 1345 134/77 mmHg 98.2 F (36.8 C) - 64 28 100 % - -  11/06/13 0844 141/58 mmHg 98.7 F (37.1 C) Oral 59 16 92 % 5\' 6"  (1.676 m) 149.007 kg (328 lb 8 oz)    Physical Exam: General:  No acute distress, awake Cardiovascular:    [x]   S1/S2 present, RRR  []   Irregularly irregular Chest:  CTA-B without wheezes. Abdomen:               []  Soft, appropriately TTP  [x]  Soft, NTTP  []  Soft, appropriately TTP, incision(s) clean/dry/intact  Genitourinary: No catheter     I/O last 3 completed shifts: In: 900 [I.V.:900] Out: -   Recent Labs     11/06/13  1000  HGB  8.8*  WBC  9.9  PLT  196    Recent Labs     11/06/13  1000  NA  140  K  3.0*  CL  99  CO2  29  BUN  31*  CREATININE  3.89*  CALCIUM  8.8  GFRNONAA  12*  GFRAA  14*     No results found for this basename: PT, INR, APTT,  in the last 72 hours   No components found with this  basename: ABG,     Length of stay: 1 days.  Assessment: Right nephrolithiasis. COPD. POD#1 Right ureteroscopy.   Plan: -Plan to set up home oxygen ASAP. -Has hemodialysis at 11:00 this morning as an outpatient.   Rolan Bucco, MD [phone]

## 2013-11-07 NOTE — Progress Notes (Signed)
NP Darrelyn Hillock, returned call update given on pt's refusal to be cathed. Will cont to monitor.

## 2013-11-07 NOTE — Anesthesia Postprocedure Evaluation (Signed)
Anesthesia Post Note  Patient: [name]  Procedure(s) Performed: Procedure(s) (LRB): CYSTOSCOPY WITH RETROGRADE PYELOGRAM, URETEROSCOPY STONE REMOVAL AND STENT REMOVAL (Right)  Anesthesia type: General  Patient location: PACU  Post pain: Pain level controlled  Post assessment: Post-op Vital signs reviewed  Last Vitals:  Filed Vitals:   11/07/13 0635  BP: 121/68  Pulse: 71  Temp: 37.2 C  Resp: 22    Post vital signs: Reviewed  Level of consciousness: sedated  Complications: No apparent anesthesia complications

## 2013-11-07 NOTE — Progress Notes (Signed)
Requested to sit up in the chair for an hour, refuses to try to urinate. States she does not feel like it. Will cont to monitor patient.

## 2013-11-07 NOTE — Plan of Care (Signed)
Problem: Phase I Progression Outcomes Goal: Voiding-avoid urinary catheter unless indicated Outcome: Not Progressing Teaching patient on when she may need to be in and out cathed.

## 2013-11-10 ENCOUNTER — Telehealth: Payer: Self-pay | Admitting: Internal Medicine

## 2013-11-10 DIAGNOSIS — J9610 Chronic respiratory failure, unspecified whether with hypoxia or hypercapnia: Secondary | ICD-10-CM | POA: Insufficient documentation

## 2013-11-10 NOTE — Telephone Encounter (Signed)
I called and made [name] aware. Will send order to [facility]. Nothing further needed

## 2013-11-10 NOTE — Telephone Encounter (Signed)
[name] returned triage's call.  [name]

## 2013-11-10 NOTE — Telephone Encounter (Signed)
lmomtcb x1 

## 2013-11-10 NOTE — Telephone Encounter (Signed)
I called and spoke with mother. She reports she was placed on O2 Friday from recent hospitilization. She reports pt has been having extremely dry nose. She does not think pt has humidifier bottle for O2. Pt mother wants to know recs for dry nose. She is going to call back and schedule an appt for pt to come in and see [place] about the O2 when she knows pt schedule. Please advise MW as we are not the one's who placed her on O2. thanks

## 2013-11-10 NOTE — Telephone Encounter (Signed)
Ok to add on humidity to her present 32

## 2013-11-13 ENCOUNTER — Ambulatory Visit (INDEPENDENT_AMBULATORY_CARE_PROVIDER_SITE_OTHER): Payer: BC Managed Care – PPO | Admitting: Internal Medicine

## 2013-11-13 ENCOUNTER — Encounter: Payer: Self-pay | Admitting: Internal Medicine

## 2013-11-13 VITALS — BP 108/60 | HR 60 | Temp 98.2°F | Ht 66.5 in | Wt 239.6 lb

## 2013-11-13 DIAGNOSIS — J9610 Chronic respiratory failure, unspecified whether with hypoxia or hypercapnia: Secondary | ICD-10-CM

## 2013-11-13 DIAGNOSIS — R0602 Shortness of breath: Secondary | ICD-10-CM

## 2013-11-13 NOTE — Progress Notes (Signed)
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   History of Present Illness  07/09/2013 1st pulmonary eval cc doe with exertion but could go up steps p d/c fromr cone 2014 but then much more  Sob with desats after surgery 7/28  With General anesthesia for cysto and stent placement  assoc with min cough still can do steps but have to stop half way up one flight, worse when talk assoc with hoarseness and sometimes a wheezing sound. Tried "some inhalers" no better rec bystolic 10 mg twice daily in place of coreg Pantoprazole (protonix) 40 mg  Take 30-60 min before first meal of the day and Pepcid 20 mg one bedtime until return to office   GERD  Diet .   08/14/2013 f/u ov/Americo Vallery re: sob and cough / using IS/ no need for kidney surgery at present Chief Complaint  Patient presents with  . Follow-up    Breathing is doing much better and her cough has resolved. No new co's today.   up 2 flights of steps had to stop, room to room sleeps flat  "wheezing sound" has resolved. rec Unless your symptoms change you are cleared for kidney surgery if needed The pepcid at bedtime is to help with night time acid that may be making you cough but can be taken as needed if night time cough recurs    11/13/2013 f/u ov/Miria Cappelli re: 02 dep resp failure since surgery  Chief Complaint  Patient presents with  . Follow-up    Pt states that during recent hospital admission she was sent home on o2.  She states that her breathing is doing well and denies any co's today.   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   No obvious daytime  variabilty or assoc cough or cp or chest tightness, subjective wheeze overt sinus or hb symptoms. No unusual exp hx or h/o childhood pna/ asthma or knowledge of premature birth.    Sleeping ok at 30 degrees without nocturnal  or early am exacerbation  of respiratory  c/o's or need for noct saba. Also denies any obvious fluctuation of symptoms with weather or environmental changes or other aggravating or alleviating factors except as outlined above   Current Medications, Allergies, Complete Past Medical History, Past Surgical History, Family History, and Social History were reviewed in [employer] record.  ROS  The following are not active complaints unless bolded sore throat, dysphagia, dental problems, itching, sneezing,  nasal congestion or excess/ purulent secretions, ear ache,   fever, chills, sweats, unintended wt loss, pleuritic or exertional cp, hemoptysis,  orthopnea pnd or leg swelling, presyncope, palpitations, heartburn, abdominal pain, anorexia, nausea, vomiting, diarrhea  or change in bowel or urinary habits, change in stools or urine, dysuria,hematuria,  rash, arthralgias, visual complaints, headache, numbness weakness or ataxia or problems with walking or coordination,  change in mood/affect or memory.            Objective:   Physical Exam   Pleasant w/c bound bf no longer with any hoarseness or pseudowheeze  11/13/2013  3330 Wt Readings from Last 3 Encounters:  08/14/13 354 lb (160.573 kg)  07/30/13 414 lb 11 oz (188.1 kg)  07/30/13 414 lb 11 oz (188.1 kg)      06/13/13 345 lb 12.8 oz (156.854 kg)  05/26/13 339 lb (153.769 kg)  05/19/13 313 lb (141.976 kg)    HEENT: nl dentition, turbinates, and orophanx. Nl external ear canals without cough reflex   NECK :  without JVD/Nodes/TM/ nl carotid upstrokes bilaterally   LUNGS: no acc muscle use, clear to A and P bilaterally without cough on insp or exp maneuvers   CV:  RRR  no s3 or murmur  or increase in P2, no edema   ABD:  Massively obese, soft and nontender with nl excursion in the supine position. No bruits or organomegaly, bowel sounds nl  MS:  warm without deformities, calf tenderness, cyanosis or clubbing  SKIN: warm and dry without lesions    NEURO:  alert, approp, no deficits       Venous dopplers 07/04/13 - Study was technically difficult due to patient body habitus. - No evidence of deep vein thrombosis involving the visualized veins of the right lower extremity and left lower extremity.  12/11/4 cxr The findings are consistent with mild congestive heart failure with  pulmonary interstitial edema. There is no pleural effusion.      Assessment & Plan:

## 2013-11-13 NOTE — Patient Instructions (Addendum)
Your weight is affecting your breathing and you will need to get in and stay in a negative calorie balance longterm to solve this problem - please discuss options for therapy with Dr Dema Severin including issue of nutrition referral is she agrees  81 recs for now While sleeping 2lpm  While sitting no 02 While walking 2lpm   Please schedule a follow up visit in 3 months but call sooner if needed

## 2013-11-14 NOTE — Assessment & Plan Note (Addendum)
She is at risk for osa/ohs but I don't see def signs of either at present.  Neg calorie balance issue reviewed > defer referral to nutrition for outpt f/u to Dr Dema Severin  Pulmonary f/u is q 3 m - consider referral to Dr Elsworth Soho (who saw in [place]) if sleep issues become a problem.

## 2013-11-14 NOTE — Assessment & Plan Note (Addendum)
-   venous dopplers neg 07/04/13   At risk for DVT/ PE but prev symptoms Improved p rx for acid reflux and explained at this point by morbid obesity (discussed separately)

## 2013-11-14 NOTE — Assessment & Plan Note (Signed)
-   placed on 02 at d/c from urologic procedure 11/07/13 and humidity added to circuit 11/10/2013  - 11/13/13 Resting RA 92% but dropped to 85 p 90 ft.  rec as of 11/13/13  Rest no 02 Room to Room no 02 More than room to room = 2lpm Sleeping = 2lpm

## 2013-11-15 ENCOUNTER — Other Ambulatory Visit: Payer: Self-pay | Admitting: Internal Medicine

## 2013-11-17 IMAGING — CR DG CHEST 1V PORT
1 series · 1 of 1 positions shown · non-contrast
Comparison: Chest x-ray 11/14/2012.

CLINICAL DATA: Assess endotracheal tube position.

PORTABLE CHEST - 1 VIEW

[AP]
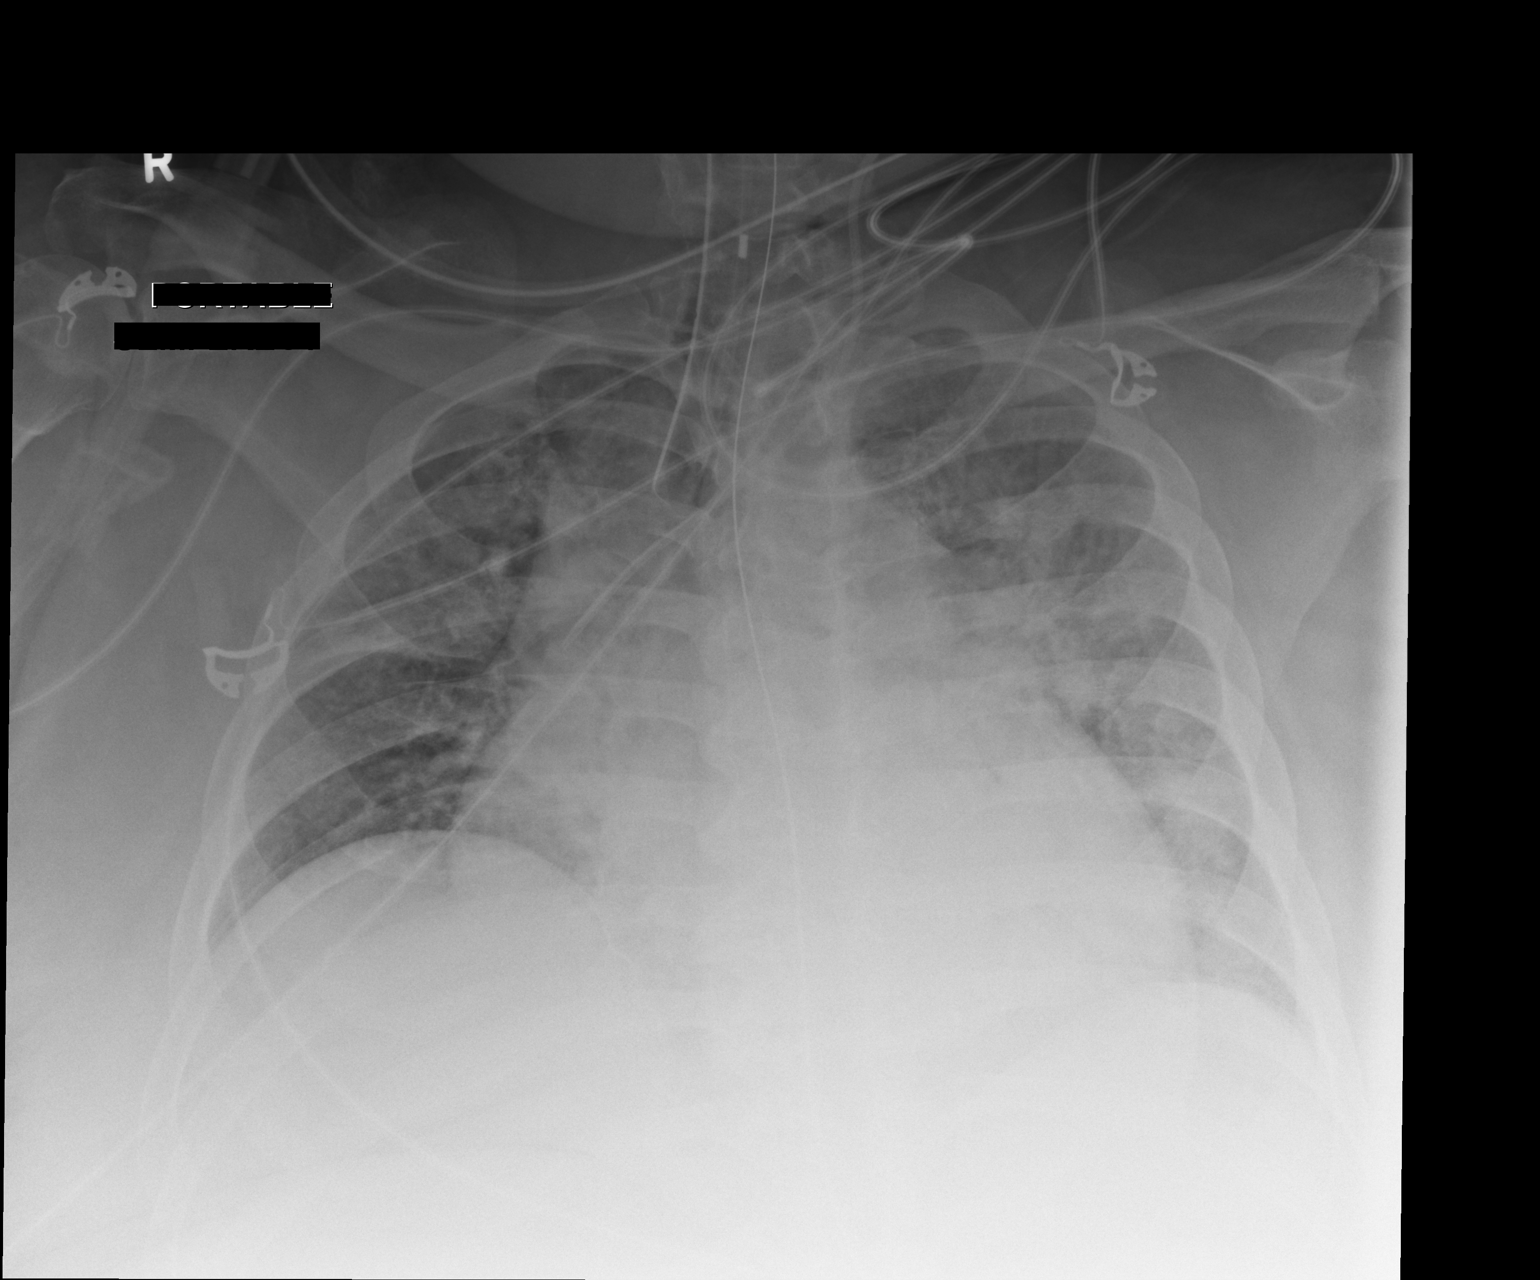

[1 of 1 positions shown; findings below may reference images not displayed]

FINDINGS: An endotracheal tube is in place with tip 2.8 cm above
the carina. A nasogastric tube is seen extending into the stomach,
however, the tip of the nasogastric tube extends below the lower
margin of the image.  Worsening opacities in the left lung,
particularly in the left upper lobe, concerning for developing
multilobar pneumonia.  No definite pleural effusions.  Crowding of
the pulmonary vasculature, accentuated by low lung volumes, without
frank pulmonary edema.  Mild cardiomegaly is unchanged. The patient
is rotated to the right on today's exam, resulting in distortion of
the mediastinal contours and reduced diagnostic sensitivity and
specificity for mediastinal pathology.
IMPRESSION: 1.  Support apparatus, as above.
2.  Findings concerning for worsening multilobar pneumonia in the
left lung, particularly in the left upper lobe.

## 2013-11-18 ENCOUNTER — Other Ambulatory Visit: Payer: Self-pay | Admitting: Internal Medicine

## 2013-11-18 IMAGING — CR DG CHEST 1V PORT
1 series · 1 of 1 positions shown · non-contrast
Comparison: Portable chest x-ray of 11/15/2012

CLINICAL DATA: Evaluate endotracheal tube position

PORTABLE CHEST - 1 VIEW

[AP]
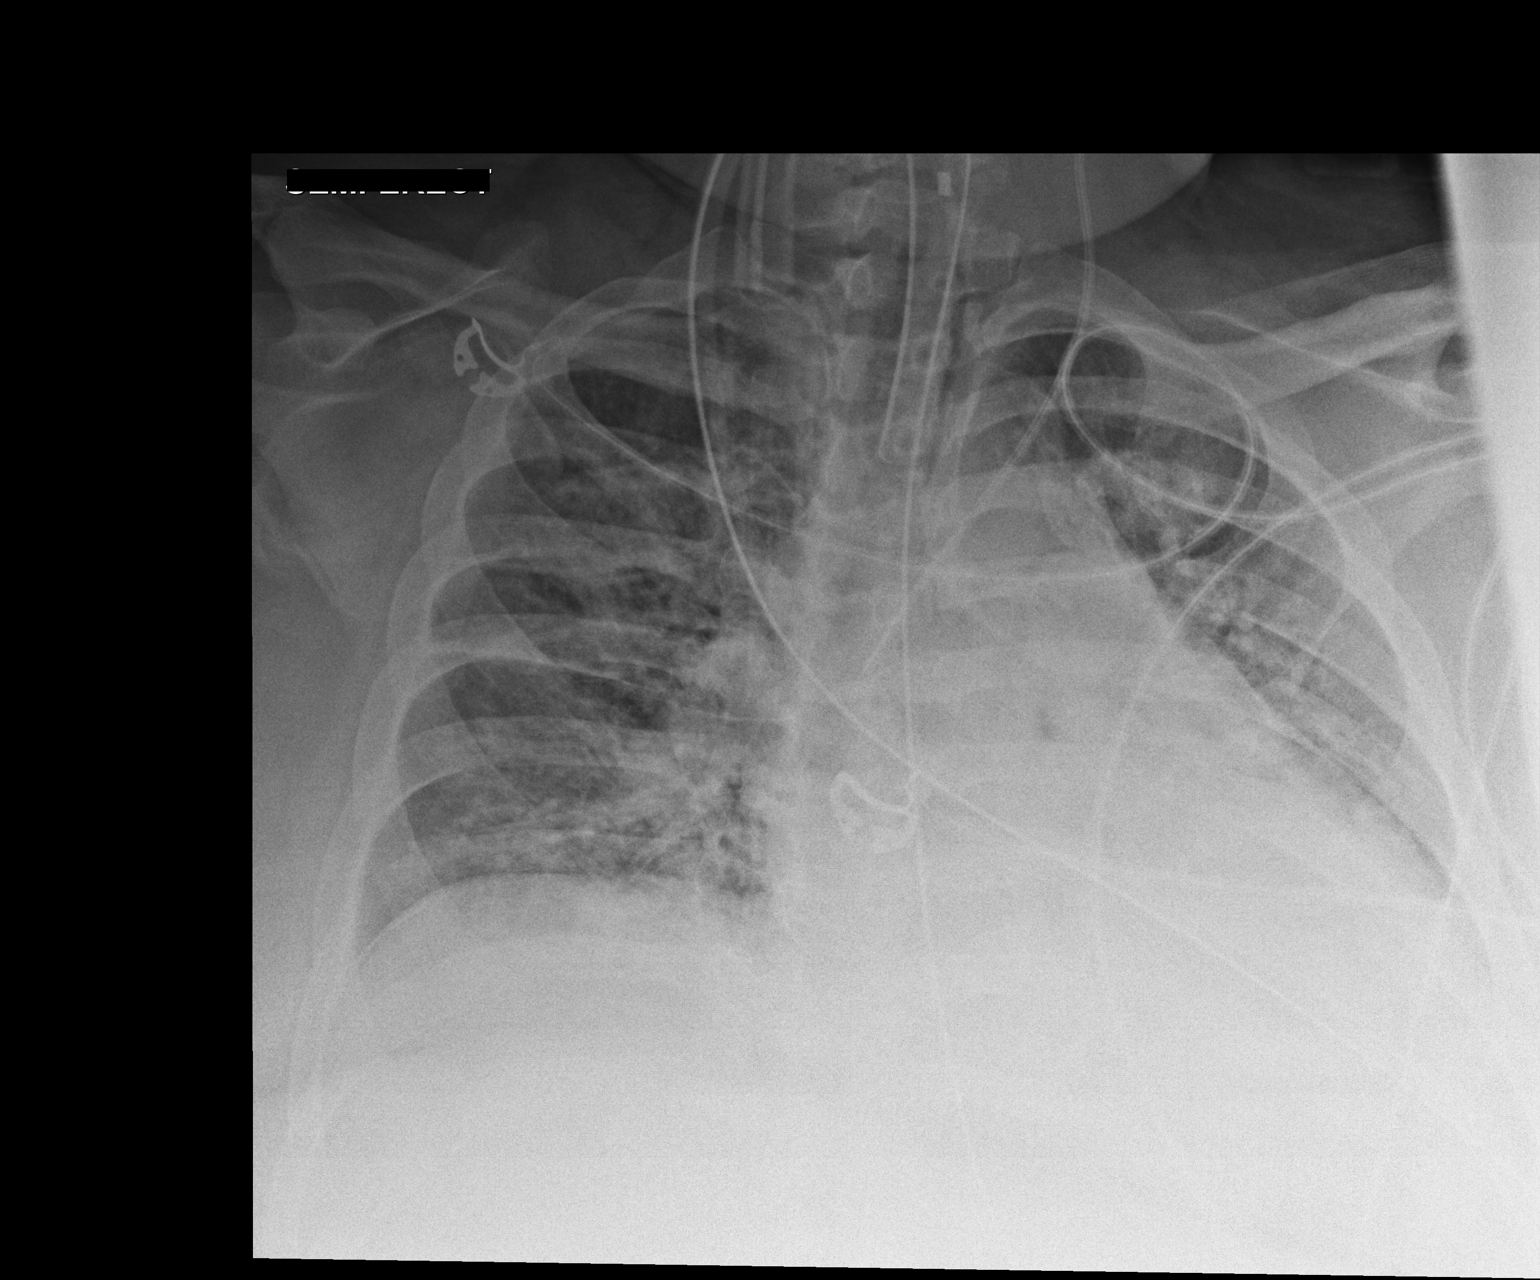

[1 of 1 positions shown; findings below may reference images not displayed]

FINDINGS: The tip of the endotracheal is approximately 3.9 cm above
the carina.  There is little change in diffuse airspace disease
although aeration may have improved slightly.  Cardiomegaly is
stable.  Left central venous line tip overlies the mid SVC.
IMPRESSION: 1.  Tip of endotracheal tube 3.9 cm above the carina.
2.  Minimally improved aeration.  Persistent cardiomegaly with
little change in diffuse airspace disease.

## 2013-11-20 IMAGING — CR DG CHEST 1V PORT
1 series · 1 of 1 positions shown · non-contrast
Comparison: 11/17/2012

CLINICAL DATA: Left effusion versus pneumonia

PORTABLE CHEST - 1 VIEW

[AP]
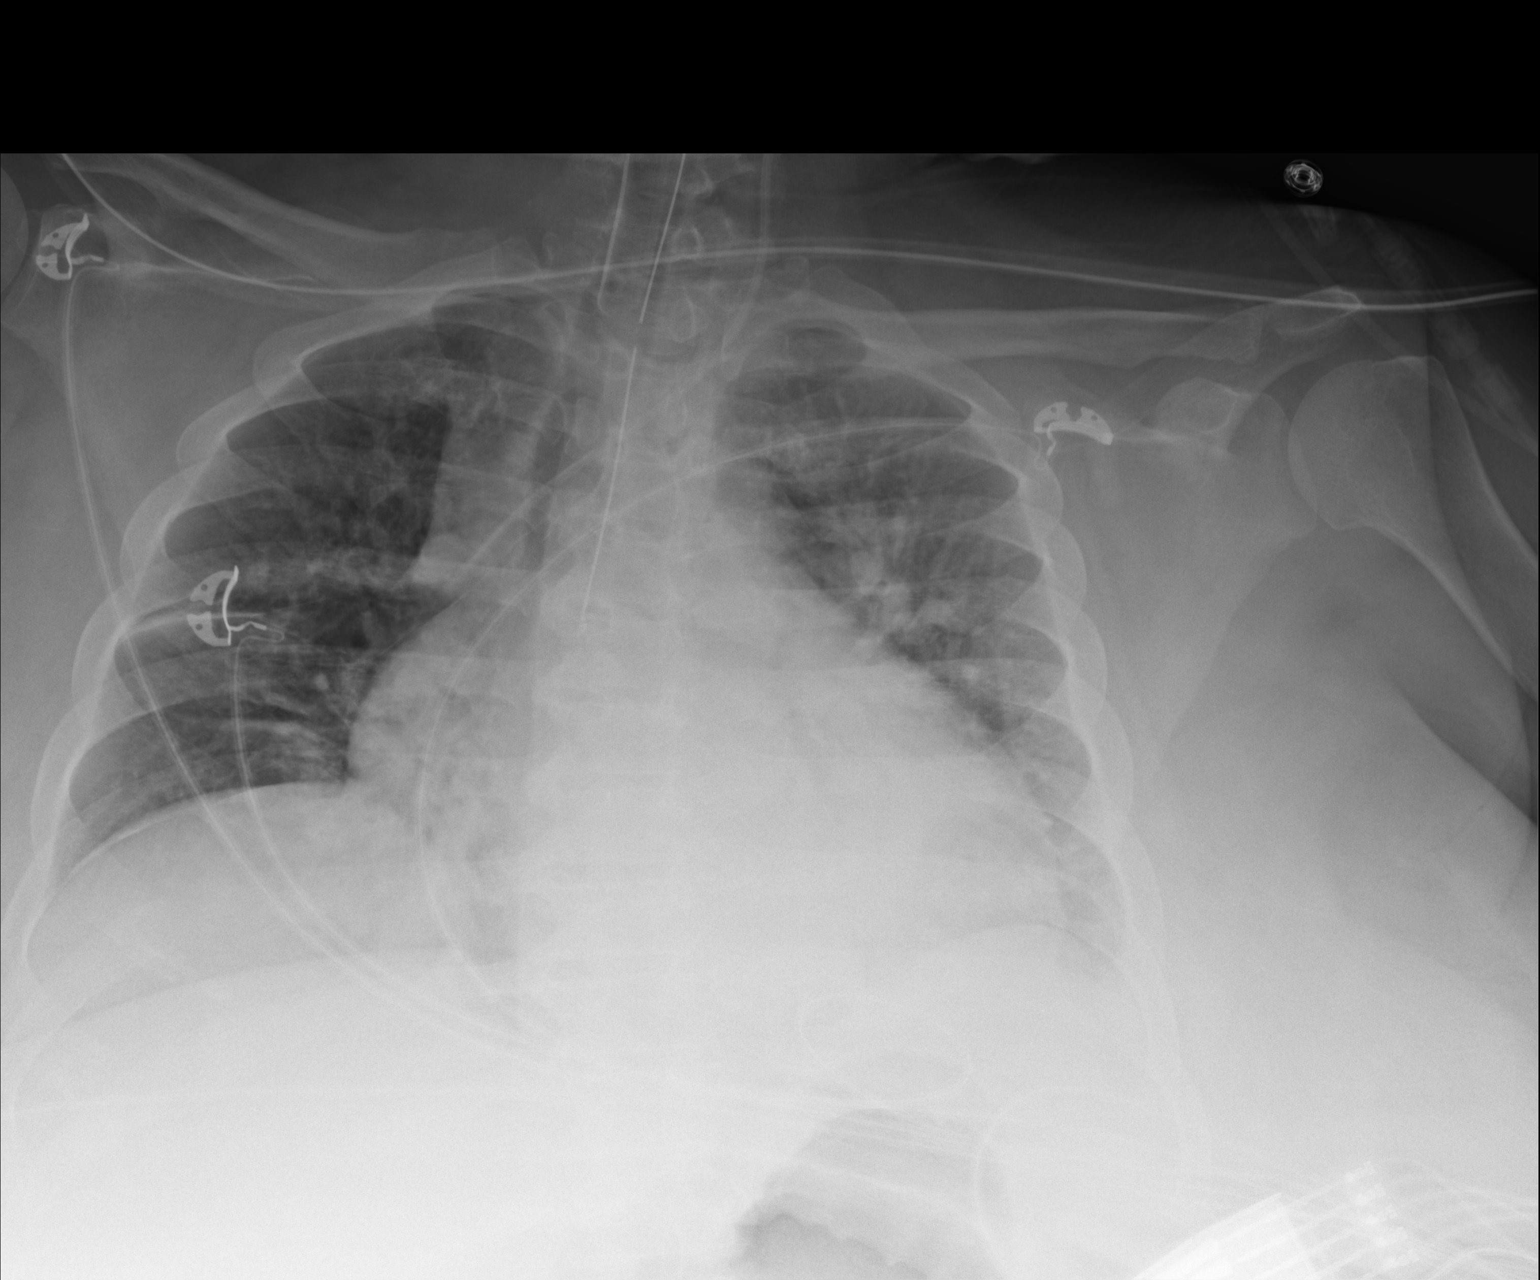

[1 of 1 positions shown; findings below may reference images not displayed]

FINDINGS: Endotracheal tube at the T1-2 level.

NG tube has been pulled back and the tip is now just below the
carina in the mid esophagus.  The left jugular central venous
catheter tip in the SVC is unchanged.

Improvement in vascular congestion.  There remains bibasilar
atelectasis or infiltrate.  No significant effusion.
IMPRESSION: NG has been pulled back with the tip in the mid esophagus.

Improvement in vascular congestion.

Bibasilar atelectasis is unchanged.

## 2013-12-22 ENCOUNTER — Other Ambulatory Visit: Payer: Self-pay | Admitting: Internal Medicine

## 2013-12-22 ENCOUNTER — Encounter: Payer: Self-pay | Admitting: Vascular Surgery

## 2013-12-23 ENCOUNTER — Ambulatory Visit: Payer: BC Managed Care – PPO | Admitting: Vascular Surgery

## 2014-01-13 ENCOUNTER — Ambulatory Visit: Payer: BC Managed Care – PPO | Admitting: Vascular Surgery

## 2014-01-21 ENCOUNTER — Other Ambulatory Visit: Payer: Self-pay | Admitting: Internal Medicine

## 2014-02-02 ENCOUNTER — Encounter: Payer: Self-pay | Admitting: Vascular Surgery

## 2014-02-03 ENCOUNTER — Ambulatory Visit (INDEPENDENT_AMBULATORY_CARE_PROVIDER_SITE_OTHER): Payer: BC Managed Care – PPO | Admitting: Vascular Surgery

## 2014-02-03 ENCOUNTER — Encounter: Payer: Self-pay | Admitting: Vascular Surgery

## 2014-02-03 VITALS — BP 134/56 | HR 62 | Ht 66.5 in | Wt 318.0 lb

## 2014-02-03 DIAGNOSIS — N186 End stage renal disease: Secondary | ICD-10-CM

## 2014-02-03 NOTE — Progress Notes (Signed)
Here today for followup of left upper arm AV fistula creation. He was seen by myself back in September and I see that 2 month followup scheduled. She's had multiple medical problems since then and this is just a recent healing of that visit that was supposed to be in November. He has subsequently on going on to end-stage renal disease and currently is being dialyzed via a left IJ catheter. She reports no difficulty with this. She has had several interventions in her fistula at the outpatient access center. I do not have the evaluation of this. She reports that she had what sounds like balloon angioplasty.  On physical exam she does have a good thrill in the fistula. She is morbidly obese and the fistula does run rather deep. The dialysis centers having success at accessing 1 needle near the antecubital space in her using the catheter for the other port. She had a recent reinnervation at the outpatient center  The plan is for a use of the saphenous recent intervention. I am concerned that her fistula may run into excessively deep for long-term success. I did explain that is appropriate to attempt user fistula but if she continues to have problems I would consider outpatient surgery for superficial mobilization of her left upper arm AV fistula. We've asked nephrologist to notify us should this be required

## 2014-02-12 ENCOUNTER — Ambulatory Visit (INDEPENDENT_AMBULATORY_CARE_PROVIDER_SITE_OTHER): Payer: BC Managed Care – PPO | Admitting: Internal Medicine

## 2014-02-12 ENCOUNTER — Encounter: Payer: Self-pay | Admitting: Internal Medicine

## 2014-02-12 VITALS — BP 112/60 | HR 62 | Temp 97.7°F | Ht 66.5 in | Wt 317.0 lb

## 2014-02-12 DIAGNOSIS — J9610 Chronic respiratory failure, unspecified whether with hypoxia or hypercapnia: Secondary | ICD-10-CM

## 2014-02-12 NOTE — Patient Instructions (Addendum)
Goal 30 min of sustained  exercise per day on 02 to help you burn the fat   02 recs for now While sleeping 2lpm  While sitting no 02 While walking 2lpm   Pulmonary follow up is as needed

## 2014-02-12 NOTE — Progress Notes (Signed)
Subjective:    Patient ID: [name], female    DOB: [birth year]  MRN: [id number]    Brief patient profile:  14 yobf perfectly healthy as child minimal smoking in the 1980's with morbid obesity/ CRI on HD since 08/2013  referred by Dr Jasmine December for preop pulmonary clearance for kidney stone removal.   History of Present Illness  07/09/2013 1st pulmonary eval cc doe with exertion but could go up steps p d/c fromr cone 2014 but then much more  Sob with desats after surgery 7/28  With General anesthesia for cysto and stent placement  assoc with min cough still can do steps but have to stop half way up one flight, worse when talk assoc with hoarseness and sometimes a wheezing sound. Tried "some inhalers" no better rec bystolic 10 mg twice daily in place of coreg Pantoprazole (protonix) 40 mg  Take 30-60 min before first meal of the day and Pepcid 20 mg one bedtime until return to office   GERD  Diet .   08/14/2013 f/u ov/Zoltan Genest re: sob and cough / using IS/ no need for kidney surgery at present Chief Complaint  Patient presents with  . Follow-up    Breathing is doing much better and her cough has resolved. No new co's today.   up 2 flights of steps had to stop, room to room sleeps flat  "wheezing sound" has resolved. rec Unless your symptoms change you are cleared for kidney surgery if needed The pepcid at bedtime is to help with night time acid that may be making you cough but can be taken as needed if night time cough recurs    11/13/2013 f/u ov/Medha Pippen re: 02 dep resp failure since surgery  Chief Complaint  Patient presents with  . Follow-up    Pt states that during recent hospital admission she was sent home on o2.  She states that her breathing is doing well and denies any co's today.   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 rec Your weight is affecting  your breathing and you will need to get in and stay in a negative calorie balance longterm to solve this problem - please discuss options for therapy with Dr Dema Severin including issue of nutrition referral is she agrees 44 recs for now While sleeping 2lpm  While sitting no 02 While walking 2lpm    02/12/2014 f/u ov/Dreshawn Hendershott re: resp failure related to morbid obsity Chief Complaint  Patient presents with  . Follow-up    Pt reports her breathing is some better since last appt. She denies any new co's today.   Not exercising on 02 as rec, mostly just using at hs   No obvious daytime variabilty or assoc cough or cp or chest tightness, subjective wheeze overt sinus or hb symptoms. No unusual exp hx or h/o childhood pna/ asthma or knowledge of premature birth.    Sleeping ok at 30 degrees without nocturnal  or early am exacerbation  of respiratory  c/o's or need for noct saba. Also denies any obvious fluctuation of symptoms with weather or environmental changes or other aggravating or alleviating factors except as outlined above   Current Medications, Allergies, Complete Past Medical History, Past Surgical History, Family History, and Social History were reviewed in [employer] record.  ROS  The following are not active complaints unless  bolded sore throat, dysphagia, dental problems, itching, sneezing,  nasal congestion or excess/ purulent secretions, ear ache,   fever, chills, sweats, unintended wt loss, pleuritic or exertional cp, hemoptysis,  orthopnea pnd or leg swelling, presyncope, palpitations, heartburn, abdominal pain, anorexia, nausea, vomiting, diarrhea  or change in bowel or urinary habits, change in stools or urine, dysuria,hematuria,  rash, arthralgias, visual complaints, headache, numbness weakness or ataxia or problems with walking or coordination,  change in mood/affect or memory.            Objective:   Physical Exam    Pleasant w/c bound bf no longer with  any hoarseness or pseudowheeze  11/13/2013     333 > 02/12/2014    317  Wt Readings from Last 3 Encounters:  08/14/13 354 lb (160.573 kg)  07/30/13 414 lb 11 oz (188.1 kg)  07/30/13 414 lb 11 oz (188.1 kg)      06/13/13 345 lb 12.8 oz (156.854 kg)  05/26/13 339 lb (153.769 kg)  05/19/13 313 lb (141.976 kg)    HEENT: nl dentition, turbinates, and orophanx. Nl external ear canals without cough reflex   NECK :  without JVD/Nodes/TM/ nl carotid upstrokes bilaterally   LUNGS: no acc muscle use, clear to A and P bilaterally without cough on insp or exp maneuvers   CV:  RRR  no s3 or murmur or increase in P2, no edema   ABD:  Massively obese, soft and nontender with nl excursion in the supine position. No bruits or organomegaly, bowel sounds nl  MS:  warm without deformities, calf tenderness, cyanosis or clubbing  SKIN: warm and dry without lesions    NEURO:  alert, approp, no deficits       Venous dopplers 07/04/13 - Study was technically difficult due to patient body habitus. - No evidence of deep vein thrombosis involving the visualized veins of the right lower extremity and left lower extremity.  12/11/4 cxr The findings are consistent with mild congestive heart failure with  pulmonary interstitial edema. There is no pleural effusion.      Assessment & Plan:

## 2014-02-13 ENCOUNTER — Encounter: Payer: Self-pay | Admitting: Internal Medicine

## 2014-02-13 NOTE — Assessment & Plan Note (Signed)
-   placed on 02 at d/c from urologic procedure 11/07/13 and humidity added to circuit 11/10/2013  - 11/13/13 Resting RA 92% but dropped to 85 p 90 ft.  rec as of 11/13/13  Rest no 02 Room to Room no 02 More than room to room = 2lpm Sleeping = 2lpm  I had an extended summary  discussion with the patient today lasting 15 to 20 minutes of a 25 minute visit on the following issues:   She does not appear to have a lung problem nor a respiratory issue that could not be solved by wt loss.  She has shown insight or interest in rehab to this point but will consider purchasing a treadmill for home use and I emphasized when she exercises it should always be while on 02 to help burn fat  Pulmonary f/u is prn

## 2014-04-02 ENCOUNTER — Encounter: Payer: BC Managed Care – PPO | Admitting: Cardiology

## 2014-04-02 NOTE — Progress Notes (Signed)
This encounter was created in error - please disregard.  This encounter was created in error - please disregard.

## 2014-07-23 ENCOUNTER — Other Ambulatory Visit: Payer: Self-pay | Admitting: Internal Medicine

## 2014-08-10 ENCOUNTER — Other Ambulatory Visit: Payer: Self-pay

## 2014-08-10 DIAGNOSIS — Z1231 Encounter for screening mammogram for malignant neoplasm of breast: Secondary | ICD-10-CM

## 2014-08-20 ENCOUNTER — Ambulatory Visit
Admission: RE | Admit: 2014-08-20 | Discharge: 2014-08-20 | Disposition: A | Discharge status: Home / Self Care | Payer: BC Managed Care – PPO | Source: Ambulatory Visit

## 2014-08-20 ENCOUNTER — Other Ambulatory Visit: Payer: Self-pay | Admitting: Internal Medicine

## 2014-08-20 DIAGNOSIS — Z1231 Encounter for screening mammogram for malignant neoplasm of breast: Secondary | ICD-10-CM

## 2014-08-26 ENCOUNTER — Other Ambulatory Visit: Payer: Self-pay | Admitting: Family Medicine

## 2014-08-26 DIAGNOSIS — R928 Other abnormal and inconclusive findings on diagnostic imaging of breast: Secondary | ICD-10-CM

## 2014-09-03 ENCOUNTER — Ambulatory Visit
Admission: RE | Admit: 2014-09-03 | Discharge: 2014-09-03 | Disposition: A | Discharge status: Home / Self Care | Payer: BC Managed Care – PPO | Source: Ambulatory Visit | Attending: Family Medicine | Admitting: Family Medicine

## 2014-09-03 DIAGNOSIS — R928 Other abnormal and inconclusive findings on diagnostic imaging of breast: Secondary | ICD-10-CM

## 2014-09-15 ENCOUNTER — Other Ambulatory Visit: Payer: Self-pay | Admitting: Internal Medicine

## 2014-10-08 ENCOUNTER — Ambulatory Visit: Payer: BC Managed Care – PPO | Admitting: Cardiology

## 2014-10-15 ENCOUNTER — Ambulatory Visit (INDEPENDENT_AMBULATORY_CARE_PROVIDER_SITE_OTHER): Payer: BC Managed Care – PPO | Admitting: Cardiology

## 2014-10-15 ENCOUNTER — Other Ambulatory Visit: Payer: Self-pay | Admitting: Internal Medicine

## 2014-10-15 VITALS — BP 110/60 | HR 69 | Ht 66.0 in | Wt 313.0 lb

## 2014-10-15 DIAGNOSIS — R0602 Shortness of breath: Secondary | ICD-10-CM

## 2014-10-15 DIAGNOSIS — J42 Unspecified chronic bronchitis: Secondary | ICD-10-CM

## 2014-10-15 DIAGNOSIS — I5032 Chronic diastolic (congestive) heart failure: Secondary | ICD-10-CM

## 2014-10-15 DIAGNOSIS — I420 Dilated cardiomyopathy: Secondary | ICD-10-CM

## 2014-10-15 DIAGNOSIS — I10 Essential (primary) hypertension: Secondary | ICD-10-CM

## 2014-10-15 NOTE — Patient Instructions (Signed)
Your physician recommends that you continue on your current medications as directed. Please refer to the Current Medication list given to you today.  Your physician wants you to follow-up in: 6 months with Dr Turner You will receive a reminder letter in the mail two months in advance. If you don't receive a letter, please call our office to schedule the follow-up appointment.  

## 2014-10-15 NOTE — Progress Notes (Signed)
[facility], [address], [place]  [address] Phone: [phone] Fax:  [phone]  Date:  10/15/2014   ID:  [name], DOB 14-Nov-1961, MRN [id number]  PCP:  [name], MD  Cardiologist:  Fransico Him, MD    History of Present Illness: [name] is a 53 y.o. female with a history of DCM now with normalized EF 50-55% by echo 8/14, obesity, chronic diastolic CHF, HTN who presents today for followup. She is doing well from a cardiac standpoint and denies any chest pain, dizziness, palpitations or syncope. She has had chronic LE edema in the past but that has resolved. She has chronic SOB and has COPD and is on O2 at night.      Wt Readings from Last 3 Encounters:  10/15/14 313 lb (141.976 kg)  02/12/14 317 lb (143.79 kg)  02/03/14 318 lb (144.244 kg)     Past Medical History  Diagnosis Date  . HTN (hypertension)   . Iron deficiency anemia   . Morbid obesity   . Hyperkalemia   . Hypothyroidism   . Hydradenitis   . Pneumonia Nov 10, 2012  . History of hemodialysis dec 2013    none currently  . Urinary tract infection     taking antibiotics for 3 days prior to surgery  . GERD (gastroesophageal reflux disease)   . H/O cardiac arrest 10/2012  . Hard of hearing 10/2012    now wears 2 hearing aids  . Depression   . SOB (shortness of breath)     hx of   . Sleep apnea     no test yet per mother 10/22/13   . DM type 2 (diabetes mellitus, type 2)     diet controlledwith retinopathy and nephropathy  . Multinodular goiter   . Hyperlipidemia   . DCM (dilated cardiomyopathy)     nonischemic  . Chronic diastolic CHF (congestive heart failure)   . Chronic kidney disease (CKD), stage III (moderate)     [name] nephrology lov note 05-12-2013 on pt chart now on HD    Current Outpatient Prescriptions  Medication Sig Dispense Refill  . aspirin EC 81 MG tablet Take 81 mg by mouth every morning.     . Calcium Acetate 667 MG TABS 1 with each meal and 1 with  snacks    . clonazePAM (KLONOPIN) 0.5 MG tablet Take 0.5 mg by mouth at bedtime.     . cyclobenzaprine (FLEXERIL) 10 MG tablet Take 1 tablet (10 mg total) by mouth 3 (three) times daily as needed for muscle spasms. For Spasms 30 tablet 0  . DULoxetine (CYMBALTA) 60 MG capsule Take 1 capsule (60 mg total) by mouth daily. 30 capsule 0  . famotidine (PEPCID) 20 MG tablet TAKE 1 TABLET BY MOUTH AT BEDTIME 30 tablet 0  . feeding supplement, RESOURCE BREEZE, (RESOURCE BREEZE) LIQD Take 1 Container by mouth 3 (three) times daily with meals.    . folic acid (FOLVITE) 1 MG tablet Take 1 tablet (1 mg total) by mouth daily. 30 tablet 0  . isosorbide mononitrate (IMDUR) 30 MG 24 hr tablet Take 30 mg by mouth every morning.    Marland Kitchen levothyroxine (SYNTHROID, LEVOTHROID) 25 MCG tablet Take 50 mcg by mouth every morning. Take on an empty stomach    . nebivolol (BYSTOLIC) 10 MG tablet Take 1 tablet (10 mg total) by mouth 2 (two) times daily. (Patient taking differently: Take 10 mg by mouth daily. ) 60 tablet 0  .  oxyCODONE-acetaminophen (PERCOCET/ROXICET) 5-325 MG per tablet Take 1-2 tablets by mouth every 4 (four) hours as needed for pain. Patient has not taken since started dialysis 08/2013 due to causes hypotension    . pravastatin (PRAVACHOL) 40 MG tablet Take 40 mg by mouth daily. Patient takes in am    . Probiotic Product (PROBIOTIC DAILY PO) Take by mouth.    . saccharomyces boulardii (FLORASTOR) 250 MG capsule Take 1 capsule (250 mg total) by mouth 2 (two) times daily. 30 capsule 0  . sodium hypochlorite (DAKIN'S 1/4 STRENGTH) 0.125 % SOLN Irrigate with 1 application as directed 2 (two) times daily. Apply to wound with dressing changes    . sucroferric oxyhydroxide (VELPHORO) 500 MG chewable tablet Chew 500 mg by mouth 3 (three) times daily with meals.     No current facility-administered medications for this visit.    Allergies:    Allergies  Allergen Reactions  . Amoxicillin-Pot Clavulanate Diarrhea  .  Rosiglitazone Maleate Swelling    AVANDIA  . Amoxicillin Rash    Tolerated Zosyn 01/2013.  [place]    Social History:  The patient  reports that she quit smoking about 34 years ago. Her smoking use included Cigarettes. She has a .25 pack-year smoking history. She has never used smokeless tobacco. She reports that she does not drink alcohol or use illicit drugs.   Family History:  The patient's family history includes Cancer in her maternal grandfather; Coronary artery disease in her father and mother; Diabetes type II in her mother; Hypertension in her father and mother; Kidney failure in her maternal grandmother; Malignant hyperthermia in her father and mother.   ROS:  Please see the history of present illness.      All other systems reviewed and negative.   PHYSICAL EXAM: VS:  BP 110/60 mmHg  Pulse 69  Ht 5\' 6"  (1.676 m)  Wt 313 lb (141.976 kg)  BMI 50.54 kg/m2 Well nourished, well developed, in no acute distress HEENT: normal Neck: no JVD Cardiac:  normal S1, S2; RRR; no murmur Lungs:  clear to auscultation bilaterally, no wheezing, rhonchi or rales Abd: soft, nontender, no hepatomegaly Ext: no edema Skin: warm and dry Neuro:  CNs 2-12 intact, no focal abnormalities noted  EKG:  NSR with no ST changes     ASSESSMENT AND PLAN:  1. Chronic diastolic CHF - her fluid is controlled on HD and appears compensated on exam - continue Bystolic 2. HTN - controlled - continue Bystolic 3. DCM - EF low normal at 48% by stress myoview 04/2013 - continue Imdur/Bystolic - Hydralazine stopped in HD  4.    COPD on home O2 - per pulmonary  5.    Morbid obesity  Followup with me in 1 year    Signed, Fransico Him, MD [facility] 10/15/2014 11:04 AM

## 2014-11-13 ENCOUNTER — Other Ambulatory Visit: Payer: Self-pay | Admitting: Internal Medicine

## 2014-12-21 ENCOUNTER — Encounter (HOSPITAL_COMMUNITY): Payer: Self-pay | Admitting: *Deleted

## 2014-12-21 ENCOUNTER — Observation Stay (HOSPITAL_COMMUNITY)
Admission: EM | Admit: 2014-12-21 | Discharge: 2014-12-22 | Disposition: A | Discharge status: Home / Self Care | Payer: BC Managed Care – PPO | Attending: Internal Medicine | Admitting: Internal Medicine

## 2014-12-21 DIAGNOSIS — Z87891 Personal history of nicotine dependence: Secondary | ICD-10-CM | POA: Insufficient documentation

## 2014-12-21 DIAGNOSIS — N186 End stage renal disease: Secondary | ICD-10-CM | POA: Insufficient documentation

## 2014-12-21 DIAGNOSIS — E876 Hypokalemia: Secondary | ICD-10-CM | POA: Diagnosis not present

## 2014-12-21 DIAGNOSIS — L732 Hidradenitis suppurativa: Secondary | ICD-10-CM | POA: Diagnosis not present

## 2014-12-21 DIAGNOSIS — J42 Unspecified chronic bronchitis: Secondary | ICD-10-CM

## 2014-12-21 DIAGNOSIS — D500 Iron deficiency anemia secondary to blood loss (chronic): Principal | ICD-10-CM | POA: Insufficient documentation

## 2014-12-21 DIAGNOSIS — K219 Gastro-esophageal reflux disease without esophagitis: Secondary | ICD-10-CM | POA: Diagnosis not present

## 2014-12-21 DIAGNOSIS — E119 Type 2 diabetes mellitus without complications: Secondary | ICD-10-CM

## 2014-12-21 DIAGNOSIS — E11319 Type 2 diabetes mellitus with unspecified diabetic retinopathy without macular edema: Secondary | ICD-10-CM | POA: Insufficient documentation

## 2014-12-21 DIAGNOSIS — Z992 Dependence on renal dialysis: Secondary | ICD-10-CM | POA: Insufficient documentation

## 2014-12-21 DIAGNOSIS — E039 Hypothyroidism, unspecified: Secondary | ICD-10-CM | POA: Diagnosis not present

## 2014-12-21 DIAGNOSIS — I420 Dilated cardiomyopathy: Secondary | ICD-10-CM | POA: Insufficient documentation

## 2014-12-21 DIAGNOSIS — Z881 Allergy status to other antibiotic agents status: Secondary | ICD-10-CM | POA: Diagnosis not present

## 2014-12-21 DIAGNOSIS — E1121 Type 2 diabetes mellitus with diabetic nephropathy: Secondary | ICD-10-CM | POA: Insufficient documentation

## 2014-12-21 DIAGNOSIS — I120 Hypertensive chronic kidney disease with stage 5 chronic kidney disease or end stage renal disease: Secondary | ICD-10-CM | POA: Insufficient documentation

## 2014-12-21 DIAGNOSIS — E785 Hyperlipidemia, unspecified: Secondary | ICD-10-CM | POA: Diagnosis not present

## 2014-12-21 DIAGNOSIS — I5032 Chronic diastolic (congestive) heart failure: Secondary | ICD-10-CM | POA: Diagnosis not present

## 2014-12-21 DIAGNOSIS — G4730 Sleep apnea, unspecified: Secondary | ICD-10-CM | POA: Insufficient documentation

## 2014-12-21 DIAGNOSIS — D649 Anemia, unspecified: Secondary | ICD-10-CM | POA: Diagnosis present

## 2014-12-21 DIAGNOSIS — J449 Chronic obstructive pulmonary disease, unspecified: Secondary | ICD-10-CM | POA: Diagnosis not present

## 2014-12-21 DIAGNOSIS — N185 Chronic kidney disease, stage 5: Secondary | ICD-10-CM

## 2014-12-21 LAB — BASIC METABOLIC PANEL
Anion gap: 11 (ref 5–15)
BUN: 20 mg/dL (ref 6–23)
CO2: 31 mmol/L (ref 19–32)
CREATININE: 4.62 mg/dL — AB (ref 0.50–1.10)
Calcium: 8 mg/dL — ABNORMAL LOW (ref 8.4–10.5)
Chloride: 94 mmol/L — ABNORMAL LOW (ref 96–112)
GFR calc Af Amer: 12 mL/min — ABNORMAL LOW (ref >90)
GFR, EST NON AFRICAN AMERICAN: 10 mL/min — AB (ref >90)
Glucose, Bld: 171 mg/dL — ABNORMAL HIGH (ref 70–99)
Potassium: 2.9 mmol/L — ABNORMAL LOW (ref 3.5–5.1)
SODIUM: 136 mmol/L (ref 135–145)

## 2014-12-21 LAB — CBC
HEMATOCRIT: 24.4 % — AB (ref 36.0–46.0)
Hemoglobin: 7.1 g/dL — ABNORMAL LOW (ref 12.0–15.0)
MCH: 29 pg (ref 26.0–34.0)
MCHC: 29.1 g/dL — ABNORMAL LOW (ref 30.0–36.0)
MCV: 99.6 fL (ref 78.0–100.0)
PLATELETS: 375 10*3/uL (ref 150–400)
RBC: 2.45 MIL/uL — AB (ref 3.87–5.11)
RDW: 16.1 % — AB (ref 11.5–15.5)
WBC: 16 10*3/uL — AB (ref 4.0–10.5)

## 2014-12-21 LAB — PREPARE RBC (CROSSMATCH)

## 2014-12-21 MED ORDER — FOLIC ACID 1 MG PO TABS
1.0000 mg | ORAL_TABLET | Freq: Every day | ORAL | Status: DC
  Administered 2014-12-22: 1 mg via ORAL
  Filled 2014-12-21: qty 1

## 2014-12-21 MED ORDER — LEVOTHYROXINE SODIUM 50 MCG PO TABS
50.0000 ug | ORAL_TABLET | Freq: Every day | ORAL | Status: DC
  Administered 2014-12-22: 50 ug via ORAL
  Filled 2014-12-21 (×2): qty 1

## 2014-12-21 MED ORDER — SODIUM CHLORIDE 0.9 % IJ SOLN: 3.0000 mL | INTRAMUSCULAR | Status: DC | PRN

## 2014-12-21 MED ORDER — SODIUM CHLORIDE 0.9 % IV SOLN: 250.0000 mL | INTRAVENOUS | Status: DC | PRN

## 2014-12-21 MED ORDER — ACETAMINOPHEN 325 MG PO TABS: 650.0000 mg | ORAL_TABLET | Freq: Four times a day (QID) | ORAL | Status: DC | PRN

## 2014-12-21 MED ORDER — FAMOTIDINE 20 MG PO TABS
20.0000 mg | ORAL_TABLET | Freq: Every day | ORAL | Status: DC
  Administered 2014-12-21: 20 mg via ORAL
  Filled 2014-12-21 (×2): qty 1

## 2014-12-21 MED ORDER — CYCLOBENZAPRINE HCL 10 MG PO TABS: 10.0000 mg | ORAL_TABLET | Freq: Three times a day (TID) | ORAL | Status: DC | PRN

## 2014-12-21 MED ORDER — SODIUM CHLORIDE 0.9 % IJ SOLN
3.0000 mL | Freq: Two times a day (BID) | INTRAMUSCULAR | Status: DC
  Administered 2014-12-21 – 2014-12-22 (×2): 3 mL via INTRAVENOUS

## 2014-12-21 MED ORDER — ISOSORBIDE MONONITRATE ER 30 MG PO TB24
30.0000 mg | ORAL_TABLET | Freq: Every morning | ORAL | Status: DC
  Administered 2014-12-22: 30 mg via ORAL
  Filled 2014-12-21: qty 1

## 2014-12-21 MED ORDER — OXYCODONE-ACETAMINOPHEN 5-325 MG PO TABS: 1.0000 | ORAL_TABLET | ORAL | Status: DC | PRN

## 2014-12-21 MED ORDER — SACCHAROMYCES BOULARDII 250 MG PO CAPS
250.0000 mg | ORAL_CAPSULE | Freq: Two times a day (BID) | ORAL | Status: DC
  Administered 2014-12-21 – 2014-12-22 (×2): 250 mg via ORAL
  Filled 2014-12-21 (×3): qty 1

## 2014-12-21 MED ORDER — MINOCYCLINE HCL 100 MG PO CAPS
100.0000 mg | ORAL_CAPSULE | Freq: Two times a day (BID) | ORAL | Status: DC
  Administered 2014-12-21 – 2014-12-22 (×2): 100 mg via ORAL
  Filled 2014-12-21 (×3): qty 1

## 2014-12-21 MED ORDER — SUCROFERRIC OXYHYDROXIDE 500 MG PO CHEW
500.0000 mg | CHEWABLE_TABLET | Freq: Three times a day (TID) | ORAL | Status: DC
  Administered 2014-12-22 (×3): 500 mg via ORAL
  Filled 2014-12-21 (×4): qty 1

## 2014-12-21 MED ORDER — PRAVASTATIN SODIUM 40 MG PO TABS
40.0000 mg | ORAL_TABLET | Freq: Every day | ORAL | Status: DC
  Administered 2014-12-22: 40 mg via ORAL
  Filled 2014-12-21: qty 1

## 2014-12-21 MED ORDER — CLONAZEPAM 0.5 MG PO TABS
0.5000 mg | ORAL_TABLET | Freq: Every day | ORAL | Status: DC
  Administered 2014-12-21: 0.5 mg via ORAL
  Filled 2014-12-21: qty 1

## 2014-12-21 MED ORDER — ONDANSETRON HCL 4 MG/2ML IJ SOLN: 4.0000 mg | Freq: Four times a day (QID) | INTRAMUSCULAR | Status: DC | PRN

## 2014-12-21 MED ORDER — SODIUM CHLORIDE 0.9 % IV SOLN
Freq: Once | INTRAVENOUS | Status: AC
  Administered 2014-12-21: via INTRAVENOUS

## 2014-12-21 MED ORDER — VITAMIN B-1 100 MG PO TABS
100.0000 mg | ORAL_TABLET | Freq: Every day | ORAL | Status: DC
  Administered 2014-12-22: 100 mg via ORAL
  Filled 2014-12-21: qty 1

## 2014-12-21 MED ORDER — ONDANSETRON HCL 4 MG PO TABS: 4.0000 mg | ORAL_TABLET | Freq: Four times a day (QID) | ORAL | Status: DC | PRN

## 2014-12-21 MED ORDER — DULOXETINE HCL 60 MG PO CPEP
60.0000 mg | ORAL_CAPSULE | Freq: Every day | ORAL | Status: DC
  Administered 2014-12-22: 60 mg via ORAL
  Filled 2014-12-21: qty 1

## 2014-12-21 MED ORDER — SODIUM CHLORIDE 0.9 % IJ SOLN
3.0000 mL | Freq: Two times a day (BID) | INTRAMUSCULAR | Status: DC
  Administered 2014-12-22: 3 mL via INTRAVENOUS

## 2014-12-21 MED ORDER — ACETAMINOPHEN 650 MG RE SUPP: 650.0000 mg | Freq: Four times a day (QID) | RECTAL | Status: DC | PRN

## 2014-12-21 MED ORDER — INSULIN ASPART 100 UNIT/ML ~~LOC~~ SOLN: 0.0000 [IU] | Freq: Three times a day (TID) | SUBCUTANEOUS | Status: DC

## 2014-12-21 MED ORDER — ADULT MULTIVITAMIN W/MINERALS CH
1.0000 | ORAL_TABLET | Freq: Every day | ORAL | Status: DC
Start: 2014-12-22 — End: 2014-12-22
  Administered 2014-12-22: 1 via ORAL
  Filled 2014-12-21: qty 1

## 2014-12-21 MED ORDER — CALCIUM ACETATE 667 MG PO CAPS
667.0000 mg | ORAL_CAPSULE | Freq: Three times a day (TID) | ORAL | Status: DC
  Filled 2014-12-21 (×2): qty 1

## 2014-12-21 MED ORDER — ASPIRIN EC 81 MG PO TBEC
81.0000 mg | DELAYED_RELEASE_TABLET | Freq: Every morning | ORAL | Status: DC
  Administered 2014-12-22: 81 mg via ORAL
  Filled 2014-12-21: qty 1

## 2014-12-21 MED ORDER — NEBIVOLOL HCL 10 MG PO TABS
10.0000 mg | ORAL_TABLET | Freq: Every day | ORAL | Status: DC
  Administered 2014-12-22: 10 mg via ORAL
  Filled 2014-12-21: qty 1

## 2014-12-21 NOTE — ED Notes (Signed)
Pt had dialysis today, was told to come here today due to low hgb and needing blood transfusion. Pt reports hx of same and only complaint is fatigue.

## 2014-12-21 NOTE — ED Provider Notes (Signed)
CSN: [id number]     Arrival date & time 12/21/14  1643 History   First MD Initiated Contact with Patient 12/21/14 1713     Chief Complaint  Patient presents with  . Fatigue  . Anemia     (Consider location/radiation/quality/duration/timing/severity/associated sxs/prior Treatment) Patient is a 54 y.o. female presenting with anemia.  Anemia This is a recurrent problem. Episode onset: told she was severely anemic on 3 days ago.   The problem occurs constantly. The problem has not changed (went to dialysis again today and told she was still anemic) since onset.Associated symptoms include shortness of breath. Pertinent negatives include no chest pain and no abdominal pain. Associated symptoms comments: Occasional dizziness. Exacerbated by: bleeding from hidradenitis. Nothing relieves the symptoms. She has tried nothing for the symptoms.    Past Medical History  Diagnosis Date  . HTN (hypertension)   . Iron deficiency anemia   . Morbid obesity   . Hyperkalemia   . Hypothyroidism   . Hydradenitis   . Pneumonia Nov 10, 2012  . History of hemodialysis dec 2013    none currently  . Urinary tract infection     taking antibiotics for 3 days prior to surgery  . GERD (gastroesophageal reflux disease)   . H/O cardiac arrest 10/2012  . Hard of hearing 10/2012    now wears 2 hearing aids  . Depression   . SOB (shortness of breath)     hx of   . Sleep apnea     no test yet per mother 10/22/13   . DM type 2 (diabetes mellitus, type 2)     diet controlledwith retinopathy and nephropathy  . Multinodular goiter   . Hyperlipidemia   . DCM (dilated cardiomyopathy)     nonischemic  . Chronic diastolic CHF (congestive heart failure)   . Chronic kidney disease (CKD), stage III (moderate)     [name] nephrology lov note 05-12-2013 on pt chart now on HD   Past Surgical History  Procedure Laterality Date  . Portacath placement    . Cystoscopy w/ ureteral stent placement  05/28/2012    Procedure:  CYSTOSCOPY WITH RETROGRADE PYELOGRAM/URETERAL STENT PLACEMENT;  Surgeon: Reece Packer, MD;  Location: [facility];  Service: Urology;  Laterality: Left;  . Cystoscopy w/ retrogrades  11/16/2012    Procedure: CYSTOSCOPY WITH RETROGRADE PYELOGRAM;  Surgeon: Reece Packer, MD;  Location: [facility];  Service: Urology;  Laterality: Bilateral;  CYSTOSCOPY,BILATERAL RETROGRADE PYELOGRAM/ REMOVAL LEFT URETERAL STENT/ FULGERATION BLADDER MUCOSA/ INSERTION RIGHT URETERAL STENT  . Port-a-cath removal    . Cystoscopy with retrograde pyelogram, ureteroscopy and stent placement Right 06/23/2013    Procedure: CYSTOSCOPY WITH RIGHT URETEROSCOPY, RIGHT  RETROGRADE PYELOGRAM, WITH LASER LIPOTRIPSY AND RIGHT URETERAL STENT EXCHANGE ;  Surgeon: Molli Hazard, MD;  Location: [facility];  Service: Urology;  Laterality: Right;  STENT EXCHANGE    . Holmium laser application N/A [id number]    Procedure: HOLMIUM LASER APPLICATION;  Surgeon: Molli Hazard, MD;  Location: [facility];  Service: Urology;  Laterality: N/A;  . Av fistula placement Left 07/25/2013    Procedure: ARTERIOVENOUS (AV) FISTULA CREATION ;  Surgeon: Rosetta Posner, MD;  Location: [place];  Service: Vascular;  Laterality: Left;  . Insertion of dialysis catheter N/A 09/24/2013    Procedure: INSERTION OF DIALYSIS CATHETER;  Surgeon: Rosetta Posner, MD;  Location: [place];  Service: Vascular;  Laterality: N/A;  . Cystoscopy with retrograde pyelogram, ureteroscopy and stent placement Right 11/06/2013  Procedure: CYSTOSCOPY WITH RETROGRADE PYELOGRAM, URETEROSCOPY STONE REMOVAL AND STENT REMOVAL;  Surgeon: Molli Hazard, MD;  Location: [facility];  Service: Urology;  Laterality: Right;   Family History  Problem Relation Age of Onset  . Coronary artery disease Mother   . Hypertension Mother   . Diabetes type II Mother   . Malignant hyperthermia Mother   . Coronary artery disease Father   . Hypertension Father   . Malignant hyperthermia Father   .  Cancer Maternal Grandfather     ? Type  . Kidney failure Maternal Grandmother    History  Substance Use Topics  . Smoking status: Former Smoker -- 0.25 packs/day for 1 years    Types: Cigarettes    Quit date: 11/28/1979  . Smokeless tobacco: Never Used  . Alcohol Use: No   OB History    No data available     Review of Systems  Constitutional: Negative for fever.  Respiratory: Positive for shortness of breath. Negative for cough.   Cardiovascular: Negative for chest pain.  Gastrointestinal: Negative for abdominal pain.  All other systems reviewed and are negative.     Allergies  Amoxicillin-pot clavulanate; Rosiglitazone maleate; and Amoxicillin  Home Medications   Prior to Admission medications   Medication Sig Start Date End Date Taking? Authorizing Provider  aspirin EC 81 MG tablet Take 81 mg by mouth every morning.     Historical Provider, MD  Calcium Acetate 667 MG TABS 1 with each meal and 1 with snacks    Historical Provider, MD  clonazePAM (KLONOPIN) 0.5 MG tablet Take 0.5 mg by mouth at bedtime.  10/22/13   Historical Provider, MD  cyclobenzaprine (FLEXERIL) 10 MG tablet Take 1 tablet (10 mg total) by mouth 3 (three) times daily as needed for muscle spasms. For Spasms 12/03/12   Janece Canterbury, MD  DULoxetine (CYMBALTA) 60 MG capsule Take 1 capsule (60 mg total) by mouth daily. 12/03/12   Janece Canterbury, MD  famotidine (PEPCID) 20 MG tablet TAKE 1 TABLET BY MOUTH AT BEDTIME 10/15/14   Tanda Rockers, MD  feeding supplement, RESOURCE BREEZE, (RESOURCE BREEZE) LIQD Take 1 Container by mouth 3 (three) times daily with meals.    Historical Provider, MD  folic acid (FOLVITE) 1 MG tablet Take 1 tablet (1 mg total) by mouth daily. 12/03/12   Janece Canterbury, MD  isosorbide mononitrate (IMDUR) 30 MG 24 hr tablet Take 30 mg by mouth every morning.    Historical Provider, MD  levothyroxine (SYNTHROID, LEVOTHROID) 25 MCG tablet Take 50 mcg by mouth every morning. Take on an empty  stomach 12/03/12   Janece Canterbury, MD  nebivolol (BYSTOLIC) 10 MG tablet Take 1 tablet (10 mg total) by mouth 2 (two) times daily. Patient taking differently: Take 10 mg by mouth daily.  08/05/13   Tanda Rockers, MD  oxyCODONE-acetaminophen (PERCOCET/ROXICET) 5-325 MG per tablet Take 1-2 tablets by mouth every 4 (four) hours as needed for pain. Patient has not taken since started dialysis 08/2013 due to causes hypotension 06/23/13   Sharyn Creamer, MD  pravastatin (PRAVACHOL) 40 MG tablet Take 40 mg by mouth daily. Patient takes in am 12/03/12   Janece Canterbury, MD  Probiotic Product (PROBIOTIC DAILY PO) Take by mouth.    Historical Provider, MD  saccharomyces boulardii (FLORASTOR) 250 MG capsule Take 1 capsule (250 mg total) by mouth 2 (two) times daily. 08/23/12   Domenic Polite, MD  sodium hypochlorite (DAKIN'S 1/4 STRENGTH) 0.125 % SOLN Irrigate with 1  application as directed 2 (two) times daily. Apply to wound with dressing changes    Historical Provider, MD  sucroferric oxyhydroxide (VELPHORO) 500 MG chewable tablet Chew 500 mg by mouth 3 (three) times daily with meals.    Historical Provider, MD   BP 112/46 mmHg  Pulse 80  Temp(Src) 98.4 F (36.9 C)  Resp 18  SpO2 96% Physical Exam  Constitutional: She is oriented to person, place, and time. She appears well-developed and well-nourished. No distress.  HENT:  Head: Normocephalic and atraumatic.  Mouth/Throat: Oropharynx is clear and moist.  Eyes: Conjunctivae are normal. Pupils are equal, round, and reactive to light. No scleral icterus.  Neck: Neck supple.  Cardiovascular: Normal rate, regular rhythm, normal heart sounds and intact distal pulses.   No murmur heard. Pulmonary/Chest: Effort normal and breath sounds normal. No stridor. No respiratory distress. She has no rales.  Abdominal: Soft. Bowel sounds are normal. She exhibits no distension. There is no tenderness.  Musculoskeletal: Normal range of motion.  Neurological: She is  alert and oriented to person, place, and time.  Skin: Skin is warm and dry. No rash noted.  Psychiatric: She has a normal mood and affect. Her behavior is normal.  Nursing note and vitals reviewed.   ED Course  Procedures (including critical care time) Labs Review All labs drawn in ED reviewed.   Imaging Review No results found.   EKG Interpretation None      MDM   Final diagnoses:  Anemia, unspecified anemia type    54 yo female with hx of ESRD on dialysis presenting with anemia.  She was told she may need a blood transfusion.     Hg 7.1.  Plan admit for transfusion.    Houston Siren III, MD 12/23/14 [phone]

## 2014-12-21 NOTE — H&P (Signed)
[facility] History and Physical  [name] [id number] DOB: 22-Apr-1961 DOA: 12/21/2014  Referring physician: Serita Grit, MD PCP: [name], MD   Chief Complaint: Anemia  HPI: [name] is a 54 y.o. female presents with anemia. Patient has a history of CKD on dilysis and was at the dialysis center today. She states that they checked her routine blood work and found her to be anemic. She was sent to th ER fo admission and transfusion. She staets that she does not really want to stay. She states no chest pain and has some shortness of breath. She staes that she normally uses oxygen when she sleeps and when she is on dialysis. Patient staes that she has not been tested for sleep apnea. Patient has a history of hydradenitis and is on antibiotics for this on Saturday. She has no fevers or chills noted. She states that her shunt has been functioning fine. She states that she loses blood due to the hydradenitis.   Review of Systems:  Constitutional:  No weight loss, night sweats, Fevers, chills, ++fatigue.  HEENT:  No headaches, Difficulty swallowing,Tooth/dental problems,Sore throat Cardio-vascular:  No chest pain, Orthopnea, PND, swelling in lower extremities,dizziness, palpitations  GI:  No heartburn, indigestion, abdominal pain, nausea, vomiting  Resp:  ++ shortness of breath with exertion. no productive cough, No coughing up of blood.No change in color of mucus.No wheezing Skin:  ++wound in groin areas GU:  no dysuria, change in color of urine, no urgency or frequency Musculoskeletal:  No joint pain or swelling. No decreased range of motion Psych:  No change in mood or affect. No depression or anxiety  Past Medical History  Diagnosis Date  . HTN (hypertension)   . Iron deficiency anemia   . Morbid obesity   . Hyperkalemia   . Hypothyroidism   . Hydradenitis   . Pneumonia Nov 10, 2012  . History of hemodialysis dec 2013    none currently  . Urinary  tract infection     taking antibiotics for 3 days prior to surgery  . GERD (gastroesophageal reflux disease)   . H/O cardiac arrest 10/2012  . Hard of hearing 10/2012    now wears 2 hearing aids  . Depression   . SOB (shortness of breath)     hx of   . Sleep apnea     no test yet per mother 10/22/13   . DM type 2 (diabetes mellitus, type 2)     diet controlledwith retinopathy and nephropathy  . Multinodular goiter   . Hyperlipidemia   . DCM (dilated cardiomyopathy)     nonischemic  . Chronic diastolic CHF (congestive heart failure)   . Chronic kidney disease (CKD), stage III (moderate)     [name] nephrology lov note 05-12-2013 on pt chart now on HD   Past Surgical History  Procedure Laterality Date  . Portacath placement    . Cystoscopy w/ ureteral stent placement  05/28/2012    Procedure: CYSTOSCOPY WITH RETROGRADE PYELOGRAM/URETERAL STENT PLACEMENT;  Surgeon: Reece Packer, MD;  Location: [facility];  Service: Urology;  Laterality: Left;  . Cystoscopy w/ retrogrades  11/16/2012    Procedure: CYSTOSCOPY WITH RETROGRADE PYELOGRAM;  Surgeon: Reece Packer, MD;  Location: [facility];  Service: Urology;  Laterality: Bilateral;  CYSTOSCOPY,BILATERAL RETROGRADE PYELOGRAM/ REMOVAL LEFT URETERAL STENT/ FULGERATION BLADDER MUCOSA/ INSERTION RIGHT URETERAL STENT  . Port-a-cath removal    . Cystoscopy with retrograde pyelogram, ureteroscopy and stent placement Right 06/23/2013    Procedure:  CYSTOSCOPY WITH RIGHT URETEROSCOPY, RIGHT  RETROGRADE PYELOGRAM, WITH LASER LIPOTRIPSY AND RIGHT URETERAL STENT EXCHANGE ;  Surgeon: Molli Hazard, MD;  Location: [facility];  Service: Urology;  Laterality: Right;  STENT EXCHANGE    . Holmium laser application N/A [id number]    Procedure: HOLMIUM LASER APPLICATION;  Surgeon: Molli Hazard, MD;  Location: [facility];  Service: Urology;  Laterality: N/A;  . Av fistula placement Left 07/25/2013    Procedure: ARTERIOVENOUS (AV) FISTULA CREATION ;   Surgeon: Rosetta Posner, MD;  Location: [place];  Service: Vascular;  Laterality: Left;  . Insertion of dialysis catheter N/A 09/24/2013    Procedure: INSERTION OF DIALYSIS CATHETER;  Surgeon: Rosetta Posner, MD;  Location[facility];  Service: Vascular;  Laterality: N/A;  . Cystoscopy with retrograde pyelogram, ureteroscopy and stent placement Right 11/06/2013    Procedure: CYSTOSCOPY WITH RETROGRADE PYELOGRAM, URETEROSCOPY STONE REMOVAL AND STENT REMOVAL;  Surgeon: Molli Hazard, MD;  Location: [facility];  Service: Urology;  Laterality: Right;   Social History:  reports that she quit smoking about 35 years ago. Her smoking use included Cigarettes. She has a .25 pack-year smoking history. She has never used smokeless tobacco. She reports that she does not drink alcohol or use illicit drugs.  Allergies  Allergen Reactions  . Amoxicillin-Pot Clavulanate Diarrhea  . Rosiglitazone Maleate Swelling    AVANDIA  . Amoxicillin Rash    Tolerated Zosyn 01/2013.  [name]    Family History  Problem Relation Age of Onset  . Coronary artery disease Mother   . Hypertension Mother   . Diabetes type II Mother   . Malignant hyperthermia Mother   . Coronary artery disease Father   . Hypertension Father   . Malignant hyperthermia Father   . Cancer Maternal Grandfather     ? Type  . Kidney failure Maternal Grandmother      Prior to Admission medications   Medication Sig Start Date End Date Taking? Authorizing Provider  aspirin EC 81 MG tablet Take 81 mg by mouth every morning.    Yes Historical Provider, MD  Calcium Acetate 667 MG TABS 1 with each meal and 1 with snacks   Yes Historical Provider, MD  clonazePAM (KLONOPIN) 0.5 MG tablet Take 0.5 mg by mouth at bedtime.  10/22/13  Yes Historical Provider, MD  cyclobenzaprine (FLEXERIL) 10 MG tablet Take 1 tablet (10 mg total) by mouth 3 (three) times daily as needed for muscle spasms. For Spasms 12/03/12  Yes Janece Canterbury, MD  DULoxetine (CYMBALTA) 60 MG  capsule Take 1 capsule (60 mg total) by mouth daily. 12/03/12  Yes Janece Canterbury, MD  famotidine (PEPCID) 20 MG tablet TAKE 1 TABLET BY MOUTH AT BEDTIME 10/15/14  Yes Tanda Rockers, MD  feeding supplement, RESOURCE BREEZE, (RESOURCE BREEZE) LIQD Take 1 Container by mouth 3 (three) times daily with meals.   Yes Historical Provider, MD  folic acid (FOLVITE) 1 MG tablet Take 1 tablet (1 mg total) by mouth daily. 12/03/12  Yes Janece Canterbury, MD  isosorbide mononitrate (IMDUR) 30 MG 24 hr tablet Take 30 mg by mouth every morning.   Yes Historical Provider, MD  levothyroxine (SYNTHROID, LEVOTHROID) 25 MCG tablet Take 50 mcg by mouth every morning. Take on an empty stomach 12/03/12  Yes Janece Canterbury, MD  minocycline (MINOCIN,DYNACIN) 100 MG capsule Take 100 mg by mouth 2 (two) times daily.   Yes Historical Provider, MD  nebivolol (BYSTOLIC) 10 MG tablet Take 1 tablet (10 mg  total) by mouth 2 (two) times daily. Patient taking differently: Take 10 mg by mouth daily.  08/05/13  Yes Tanda Rockers, MD  oxyCODONE-acetaminophen (PERCOCET/ROXICET) 5-325 MG per tablet Take 1-2 tablets by mouth every 4 (four) hours as needed for pain. Patient has not taken since started dialysis 08/2013 due to causes hypotension 06/23/13  Yes Sharyn Creamer, MD  pravastatin (PRAVACHOL) 40 MG tablet Take 40 mg by mouth daily. Patient takes in am 12/03/12  Yes Janece Canterbury, MD  Probiotic Product (PROBIOTIC DAILY PO) Take by mouth.   Yes Historical Provider, MD  saccharomyces boulardii (FLORASTOR) 250 MG capsule Take 1 capsule (250 mg total) by mouth 2 (two) times daily. 08/23/12  Yes Domenic Polite, MD  sodium hypochlorite (DAKIN'S 1/4 STRENGTH) 0.125 % SOLN Irrigate with 1 application as directed 2 (two) times daily. Apply to wound with dressing changes   Yes Historical Provider, MD  sucroferric oxyhydroxide (VELPHORO) 500 MG chewable tablet Chew 500 mg by mouth 3 (three) times daily with meals.   Yes Historical Provider, MD    Physical Exam: Filed Vitals:   12/21/14 1656 12/21/14 1830 12/21/14 1915 12/21/14 2000  BP: 112/46 97/44 100/41 112/82  Pulse: 80 70 72 70  Temp: 98.4 F (36.9 C)     Resp: 18 18    SpO2: 96% 97% 100% 100%    Wt Readings from Last 3 Encounters:  10/15/14 141.976 kg (313 lb)  02/12/14 143.79 kg (317 lb)  02/03/14 144.244 kg (318 lb)    General:  Appears calm and comfortable Eyes: PERRL, normal lids, irises & conjunctiva ENT: grossly normal hearing, lips & tongue Neck: no LAD, masses or thyromegaly Cardiovascular: RRR, no m/r/g. No LE edema. Respiratory: CTA bilaterally, no w/r/r. Normal respiratory effort. Abdomen: soft, ntnd Musculoskeletal: grossly normal tone BUE/BLE Psychiatric: grossly normal mood and affect, speech fluent and appropriate Neurologic: grossly non-focal.          Labs on Admission:  Basic Metabolic Panel:  Recent Labs Lab 12/21/14 1706  NA 136  K 2.9*  CL 94*  CO2 31  GLUCOSE 171*  BUN 20  CREATININE 4.62*  CALCIUM 8.0*   Liver Function Tests: No results for input(s): AST, ALT, ALKPHOS, BILITOT, PROT, ALBUMIN in the last 168 hours. No results for input(s): LIPASE, AMYLASE in the last 168 hours. No results for input(s): AMMONIA in the last 168 hours. CBC:  Recent Labs Lab 12/21/14 1706  WBC 16.0*  HGB 7.1*  HCT 24.4*  MCV 99.6  PLT 375   Cardiac Enzymes: No results for input(s): CKTOTAL, CKMB, CKMBINDEX, TROPONINI in the last 168 hours.  BNP (last 3 results) No results for input(s): PROBNP in the last 8760 hours. CBG: No results for input(s): GLUCAP in the last 168 hours.  Radiological Exams on Admission: No results found.   Assessment/Plan Active Problems:   DM type 2 (diabetes mellitus, type 2)   Chronic kidney disease (CKD), stage V   Hidradenitis suppurativa   Anemia   COPD (chronic obstructive pulmonary disease)   1. Anemia -will admit for observation -she states she loses blood chronically from her  adenitis -will transfuse blood now -Probable discharge in am  2. DM Type 2 -she states that she does not take anything for the diabetes -will monitor FSBS -cover as needed -check A1C  3. Chronic hydreadenitis -will get wound care assessment -will continue with antibiotics  4. COPD -stable presently -on oxygen as needed  5. CKD on Dialysis -dialyzed today  6.  Hypokalemia -will give one time dose of potassium 60mEq -repeat labs in am  Code Status: Full Code (must indicate code status--if unknown or must be presumed, indicate so) DVT Prophylaxis:SCDs Family Communication: Husband(indicate person spoken with, if applicable, with phone number if by telephone) Disposition Plan: Home (indicate anticipated LOS)  Time spent: 65min  KHAN,SAADAT A [facility] Pager [phone]

## 2014-12-22 ENCOUNTER — Encounter (HOSPITAL_COMMUNITY): Payer: Self-pay | Admitting: General Surgery

## 2014-12-22 LAB — CBC
HCT: 22.2 % — ABNORMAL LOW (ref 36.0–46.0)
Hemoglobin: 6.4 g/dL — CL (ref 12.0–15.0)
MCH: 28.6 pg (ref 26.0–34.0)
MCHC: 28.8 g/dL — ABNORMAL LOW (ref 30.0–36.0)
MCV: 99.1 fL (ref 78.0–100.0)
PLATELETS: 303 10*3/uL (ref 150–400)
RBC: 2.24 MIL/uL — ABNORMAL LOW (ref 3.87–5.11)
RDW: 16.6 % — AB (ref 11.5–15.5)
WBC: 10.6 10*3/uL — ABNORMAL HIGH (ref 4.0–10.5)

## 2014-12-22 LAB — VITAMIN B12: Vitamin B-12: 824 pg/mL (ref 211–911)

## 2014-12-22 LAB — COMPREHENSIVE METABOLIC PANEL
ALT: 6 U/L (ref 0–35)
AST: 8 U/L (ref 0–37)
Albumin: 2 g/dL — ABNORMAL LOW (ref 3.5–5.2)
Alkaline Phosphatase: 101 U/L (ref 39–117)
Anion gap: 15 (ref 5–15)
BUN: 31 mg/dL — AB (ref 6–23)
CO2: 28 mmol/L (ref 19–32)
Calcium: 8.1 mg/dL — ABNORMAL LOW (ref 8.4–10.5)
Chloride: 97 mmol/L (ref 96–112)
Creatinine, Ser: 6.29 mg/dL — ABNORMAL HIGH (ref 0.50–1.10)
GFR, EST AFRICAN AMERICAN: 8 mL/min — AB (ref >90)
GFR, EST NON AFRICAN AMERICAN: 7 mL/min — AB (ref >90)
Glucose, Bld: 124 mg/dL — ABNORMAL HIGH (ref 70–99)
Potassium: 3.1 mmol/L — ABNORMAL LOW (ref 3.5–5.1)
SODIUM: 140 mmol/L (ref 135–145)
Total Bilirubin: 0.4 mg/dL (ref 0.3–1.2)
Total Protein: 7.8 g/dL (ref 6.0–8.3)

## 2014-12-22 LAB — IRON AND TIBC
Iron: 34 ug/dL — ABNORMAL LOW (ref 42–145)
Saturation Ratios: 24 % (ref 20–55)
TIBC: 144 ug/dL — ABNORMAL LOW (ref 250–470)
UIBC: 110 ug/dL — ABNORMAL LOW (ref 125–400)

## 2014-12-22 LAB — GLUCOSE, CAPILLARY
GLUCOSE-CAPILLARY: 114 mg/dL — AB (ref 70–99)
GLUCOSE-CAPILLARY: 151 mg/dL — AB (ref 70–99)
Glucose-Capillary: 94 mg/dL (ref 70–99)
Glucose-Capillary: 143 mg/dL — ABNORMAL HIGH (ref 70–99)

## 2014-12-22 LAB — FOLATE: Folate: 13 ng/mL

## 2014-12-22 LAB — RETICULOCYTES
RBC.: 2.4 MIL/uL — ABNORMAL LOW (ref 3.87–5.11)
RETIC CT PCT: 4.6 % — AB (ref 0.4–3.1)
Retic Count, Absolute: 110.4 10*3/uL (ref 19.0–186.0)

## 2014-12-22 LAB — HEMOGLOBIN A1C
HEMOGLOBIN A1C: 5.3 % (ref <5.7)
Mean Plasma Glucose: 105 mg/dL (ref <117)

## 2014-12-22 LAB — HEMOGLOBIN AND HEMATOCRIT, BLOOD
HCT: 27.8 % — ABNORMAL LOW (ref 36.0–46.0)
Hemoglobin: 8.4 g/dL — ABNORMAL LOW (ref 12.0–15.0)

## 2014-12-22 LAB — FERRITIN: Ferritin: 780 ng/mL — ABNORMAL HIGH (ref 10–291)

## 2014-12-22 LAB — TSH: TSH: 2.808 u[IU]/mL (ref 0.350–4.500)

## 2014-12-22 LAB — PREPARE RBC (CROSSMATCH)

## 2014-12-22 LAB — MRSA PCR SCREENING: MRSA by PCR: NEGATIVE

## 2014-12-22 MED ORDER — NEBIVOLOL HCL 10 MG PO TABS
10.0000 mg | ORAL_TABLET | Freq: Every day | ORAL | Status: DC
Start: 2014-12-22 — End: 2015-09-09

## 2014-12-22 MED ORDER — POTASSIUM CHLORIDE CRYS ER 20 MEQ PO TBCR
40.0000 meq | EXTENDED_RELEASE_TABLET | Freq: Once | ORAL | Status: AC
  Administered 2014-12-22: 40 meq via ORAL
  Filled 2014-12-22: qty 2

## 2014-12-22 MED ORDER — SODIUM CHLORIDE 0.9 % IV SOLN
Freq: Once | INTRAVENOUS | Status: AC
  Administered 2014-12-22: 10:00:00 via INTRAVENOUS

## 2014-12-22 NOTE — Discharge Summary (Signed)
Physician Discharge Summary  Patient ID: Izza Warbington MRN: [id number] DOB/AGE: 53-Dec-1962 53 y.o.  Admit date: 12/21/2014 Discharge date: 12/22/2014  Primary Care Physician:  [name], MD  Discharge Diagnoses:    . Chronic kidney disease (CKD), stage V . acute on chronic Anemia from blood loss from hiradenitis lesions . COPD (chronic obstructive pulmonary disease) . severe chronic Hidradenitis suppurativa  Consults:  Surgery, Dr Ninfa Linden   Recommendations for Outpatient Follow-up:  Patient should have weekly CBC checked and may transfuse PRN for hb <7.0    DIET: renal diet    Allergies:   Allergies  Allergen Reactions  . Amoxicillin-Pot Clavulanate Diarrhea  . Rosiglitazone Maleate Swelling    AVANDIA  . Amoxicillin Rash    Tolerated Zosyn 01/2013.  [name]     Discharge Medications:   Medication List    STOP taking these medications        aspirin EC 81 MG tablet      TAKE these medications        Calcium Acetate 667 MG Tabs  1 with each meal and 1 with snacks     clonazePAM 0.5 MG tablet  Commonly known as:  KLONOPIN  Take 0.5 mg by mouth at bedtime.     cyclobenzaprine 10 MG tablet  Commonly known as:  FLEXERIL  Take 1 tablet (10 mg total) by mouth 3 (three) times daily as needed for muscle spasms. For Spasms     DULoxetine 60 MG capsule  Commonly known as:  CYMBALTA  Take 1 capsule (60 mg total) by mouth daily.     famotidine 20 MG tablet  Commonly known as:  PEPCID  TAKE 1 TABLET BY MOUTH AT BEDTIME     feeding supplement (RESOURCE BREEZE) Liqd  Take 1 Container by mouth 3 (three) times daily with meals.     folic acid 1 MG tablet  Commonly known as:  FOLVITE  Take 1 tablet (1 mg total) by mouth daily.     isosorbide mononitrate 30 MG 24 hr tablet  Commonly known as:  IMDUR  Take 30 mg by mouth every morning.     levothyroxine 25 MCG tablet  Commonly known as:  SYNTHROID, LEVOTHROID  Take 50 mcg by mouth every morning. Take on  an empty stomach     minocycline 100 MG capsule  Commonly known as:  MINOCIN,DYNACIN  Take 100 mg by mouth 2 (two) times daily.     nebivolol 10 MG tablet  Commonly known as:  BYSTOLIC  Take 1 tablet (10 mg total) by mouth daily.     oxyCODONE-acetaminophen 5-325 MG per tablet  Commonly known as:  PERCOCET/ROXICET  Take 1-2 tablets by mouth every 4 (four) hours as needed for pain. Patient has not taken since started dialysis 08/2013 due to causes hypotension     pravastatin 40 MG tablet  Commonly known as:  PRAVACHOL  Take 40 mg by mouth daily. Patient takes in am     PROBIOTIC DAILY PO  Take by mouth.     saccharomyces boulardii 250 MG capsule  Commonly known as:  FLORASTOR  Take 1 capsule (250 mg total) by mouth 2 (two) times daily.     sodium hypochlorite 0.125 % Soln  Commonly known as:  DAKIN'S 1/4 STRENGTH  Irrigate with 1 application as directed 2 (two) times daily. Apply to wound with dressing changes     VELPHORO 500 MG chewable tablet  Generic drug:  sucroferric oxyhydroxide  Chew 500 mg by mouth  3 (three) times daily with meals.         Brief H and P: For complete details please refer to admission H and P, but in brief Patient is a 54 year old female with ESRD on hemodialysis, diabetes, COPD, chronic severe hiradenitis, chronic anemia presented with anemia from dialysis center. She had a routine blood work done and was found to be anemic with hemoglobin of 7.1. Patient actually reports that her baseline hemoglobin is ~7. Patient denied any chest pain or dizziness or lightheadedness. She has some shortness of breath and normally uses oxygen when she sleeps and when she has dialysis. She also reported that she chronically uses blood due to hiradenitis  Hospital Course:   Anemia: Due to chronic blood loss from hiradenitis lesions - Patient was transfused 1 unit packed RBC however hemoglobin still 6.4.  Transfused 2 units of packed RBCs. I looked at the lesions  myself, patient has oozing from hiradenitis. - She wantsed to be discharged after the blood transfusion, hb improved to 8.4.  Active Problems: Severe chronic Hidradenitis suppurativa - Patient just started doxycycline outpatient on Saturday for 30 days, continue requested wound care consult however out of their scope of practice. Patient initially refused surgery evaluation as she has followed with [facility] and at [facility] and has had no benefit of the surgeries. Patient followed up with dermatology last week. Per surgery recommendations, Dr Ninfa Linden, patient has very large area of hiradenitis, patient completely understands her disease process and it is not curable. patient requires very large excision with eventual skin grafting. She was recommended to follow-up with plastic surgery and continue care at [facility] care center. Patient does not want any further workup regarding hiradenitis here. She is willing to have as needed transfusions and continue to follow up at [facility]. Per surgery, areas are draining well and hence no need for incision and drainage.    DM type 2 (diabetes mellitus, type 2) - Continue sliding scale insulin    Chronic kidney disease (CKD), stage V - Patient will continue hemodialysis per her schedule MWF    COPD (chronic obstructive pulmonary disease) currently stable   Day of Discharge BP 136/40 mmHg  Pulse 66  Temp(Src) 98.4 F (36.9 C) (Oral)  Resp 16  SpO2 100%  Physical Exam: General: Alert and awake oriented x3 not in any acute distress. HEENT: anicteric sclera, pupils reactive to light and accommodation CVS: S1-S2 clear no murmur rubs or gallops Chest: clear to auscultation bilaterally, no wheezing rales or rhonchi Abdomen: soft nontender, nondistended, normal bowel sounds Extremities: no cyanosis, clubbing or edema noted bilaterally Multiple areas of hiradenitis, open, draining tract to left buttock, posterior thigh and  perineum Neuro: Cranial nerves II-XII intact, no focal neurological deficits   The results of significant diagnostics from this hospitalization (including imaging, microbiology, ancillary and laboratory) are listed below for reference.    LAB RESULTS: Basic Metabolic Panel:  Recent Labs Lab 12/21/14 1706 12/22/14 0554  NA 136 140  K 2.9* 3.1*  CL 94* 97  CO2 31 28  GLUCOSE 171* 124*  BUN 20 31*  CREATININE 4.62* 6.29*  CALCIUM 8.0* 8.1*   Liver Function Tests:  Recent Labs Lab 12/22/14 0554  AST 8  ALT 6  ALKPHOS 101  BILITOT 0.4  PROT 7.8  ALBUMIN 2.0*   No results for input(s): LIPASE, AMYLASE in the last 168 hours. No results for input(s): AMMONIA in the last 168 hours. CBC:  Recent Labs Lab 12/21/14 1706  12/22/14 0554 12/22/14 1909  WBC 16.0* 10.6*  --   HGB 7.1* 6.4* 8.4*  HCT 24.4* 22.2* 27.8*  MCV 99.6 99.1  --   PLT 375 303  --    Cardiac Enzymes: No results for input(s): CKTOTAL, CKMB, CKMBINDEX, TROPONINI in the last 168 hours. BNP: Invalid input(s): POCBNP CBG:  Recent Labs Lab 12/22/14 1134 12/22/14 1657  GLUCAP 143* 151*    Significant Diagnostic Studies:  No results found.  2D ECHO:   Disposition and Follow-up:    DISPOSITION: home   DISCHARGE FOLLOW-UP     Follow-up Information    Follow up with WHITE,CYNTHIA S, MD. Schedule an appointment as soon as possible for a visit in 2 weeks.   Specialty:  Family Medicine   Why:  for hospital follow-up   Contact information:   [place], [place] [address] [phone]        Time spent on Discharge: 31 mins  Signed:   RAI,RIPUDEEP M.D. [facility] 12/22/2014, 9:14 PM Pager: [phone]

## 2014-12-22 NOTE — Progress Notes (Signed)
VRE infection from 07/17/13 resolved.  Otila Kluver, RN notified to discontinue contact precautions

## 2014-12-22 NOTE — Progress Notes (Signed)
Paged Dr Tana Coast, 2 units PRBC's complete, H&H to be drawn @ 102, will pt be dcd home today?

## 2014-12-22 NOTE — Consult Note (Addendum)
WOC consult Note Reason for Consult: Consult requested for multiple wounds to buttocks. Pt has hydradenitis, which is a complex skin condition sometimes requiring surgical intervention. She is familiar to [facility] team and I believe she has been followed in the past at [facility] for this complex medical condition. This is beyond [facility] scope of practice. Please refer to [facility] team if aggressive plan of care is desired. Please re-consult if further assistance is needed. Thank-you,  Julien Girt MSN, [place], [place], [place], [place]

## 2014-12-22 NOTE — Consult Note (Signed)
Reason for Consult: hidradenitis  Referring Physician: Dr. West Pugh is an 54 y.o. female with a history of CKD on HD, chronic anemia, obesity, hidradenitis suppurativa, CHF.  HPI: we have been asked to evaluate the patient for chronic hidradenitis.  We initially evaluated the patient in 2012, she subsequently underwent incision and drainage of her perineum.  Plastics also saw the patient as did ID.  The patient has been seen at [facility] as well.  The patient states these areas bleed quite frequently.  Ongoing drainage.  She denies fever or chills.  She does not appear to have a good understanding of this chronic disease.    Past Medical History  Diagnosis Date  . HTN (hypertension)   . Iron deficiency anemia   . Morbid obesity   . Hyperkalemia   . Hypothyroidism   . Hydradenitis   . Pneumonia Nov 10, 2012  . History of hemodialysis dec 2013    none currently  . Urinary tract infection     taking antibiotics for 3 days prior to surgery  . GERD (gastroesophageal reflux disease)   . H/O cardiac arrest 10/2012  . Hard of hearing 10/2012    now wears 2 hearing aids  . Depression   . SOB (shortness of breath)     hx of   . Sleep apnea     no test yet per mother 10/22/13   . DM type 2 (diabetes mellitus, type 2)     diet controlledwith retinopathy and nephropathy  . Multinodular goiter   . Hyperlipidemia   . DCM (dilated cardiomyopathy)     nonischemic  . Chronic diastolic CHF (congestive heart failure)   . Chronic kidney disease (CKD), stage III (moderate)     [name] nephrology lov note 05-12-2013 on pt chart now on HD    Past Surgical History  Procedure Laterality Date  . Portacath placement    . Cystoscopy w/ ureteral stent placement  05/28/2012    Procedure: CYSTOSCOPY WITH RETROGRADE PYELOGRAM/URETERAL STENT PLACEMENT;  Surgeon: Reece Packer, MD;  [facility]: [facility];  Service: Urology;  Laterality: Left;  . Cystoscopy w/ retrogrades  11/16/2012    Procedure:  CYSTOSCOPY WITH RETROGRADE PYELOGRAM;  Surgeon: Reece Packer, MD;  [facility]: [facility];  Service: Urology;  Laterality: Bilateral;  CYSTOSCOPY,BILATERAL RETROGRADE PYELOGRAM/ REMOVAL LEFT URETERAL STENT/ FULGERATION BLADDER MUCOSA/ INSERTION RIGHT URETERAL STENT  . Port-a-cath removal    . Cystoscopy with retrograde pyelogram, ureteroscopy and stent placement Right 06/23/2013    Procedure: CYSTOSCOPY WITH RIGHT URETEROSCOPY, RIGHT  RETROGRADE PYELOGRAM, WITH LASER LIPOTRIPSY AND RIGHT URETERAL STENT EXCHANGE ;  Surgeon: Molli Hazard, MD;  [facility];  Service: Urology;  Laterality: Right;  STENT EXCHANGE    . Holmium laser application N/A 2/45/8099    Procedure: HOLMIUM LASER APPLICATION;  Surgeon: Molli Hazard, MD;  [facility];  Service: Urology;  Laterality: N/A;  . Av fistula placement Left 07/25/2013    Procedure: ARTERIOVENOUS (AV) FISTULA CREATION ;  Surgeon: Rosetta Posner, MD;  Location: [place];  Service: Vascular;  Laterality: Left;  . Insertion of dialysis catheter N/A 09/24/2013    Procedure: INSERTION OF DIALYSIS CATHETER;  Surgeon: Rosetta Posner, MD;  Location: [facility] OR;  Service: Vascular;  Laterality: N/A;  . Cystoscopy with retrograde pyelogram, ureteroscopy and stent placement Right 11/06/2013    Procedure: CYSTOSCOPY WITH RETROGRADE PYELOGRAM, URETEROSCOPY STONE REMOVAL AND STENT REMOVAL;  Surgeon: Molli Hazard, MD;  Location: [facility];  Service: Urology;  Laterality: Right;    Family History  Problem Relation Age of Onset  . Coronary artery disease Mother   . Hypertension Mother   . Diabetes type II Mother   . Malignant hyperthermia Mother   . Coronary artery disease Father   . Hypertension Father   . Malignant hyperthermia Father   . Cancer Maternal Grandfather     ? Type  . Kidney failure Maternal Grandmother     Social History:  reports that she quit smoking about 35 years ago. Her smoking use included Cigarettes. She has a .25  pack-year smoking history. She has never used smokeless tobacco. She reports that she does not drink alcohol or use illicit drugs.  Allergies:  Allergies  Allergen Reactions  . Amoxicillin-Pot Clavulanate Diarrhea  . Rosiglitazone Maleate Swelling    AVANDIA  . Amoxicillin Rash    Tolerated Zosyn 01/2013.  [name]    Medications:  Scheduled Meds: . aspirin EC  81 mg Oral q morning - 10a  . calcium acetate  667 mg Oral TID AC & HS  . clonazePAM  0.5 mg Oral QHS  . DULoxetine  60 mg Oral Daily  . famotidine  20 mg Oral QHS  . folic acid  1 mg Oral Daily  . insulin aspart  0-15 Units Subcutaneous TID WC  . isosorbide mononitrate  30 mg Oral q morning - 10a  . levothyroxine  50 mcg Oral QAC breakfast  . minocycline  100 mg Oral BID  . multivitamin with minerals  1 tablet Oral Daily  . nebivolol  10 mg Oral Daily  . pravastatin  40 mg Oral q1800  . saccharomyces boulardii  250 mg Oral BID  . sodium chloride  3 mL Intravenous Q12H  . sodium chloride  3 mL Intravenous Q12H  . sucroferric oxyhydroxide  500 mg Oral TID WC  . thiamine  100 mg Oral Daily   Continuous Infusions:  PRN Meds:.sodium chloride, acetaminophen **OR** acetaminophen, cyclobenzaprine, ondansetron **OR** ondansetron (ZOFRAN) IV, oxyCODONE-acetaminophen, sodium chloride   Results for orders placed or performed during the hospital encounter of 12/21/14 (from the past 48 hour(s))  CBC     Status: Abnormal   Collection Time: 12/21/14  5:06 PM  Result Value Ref Range   WBC 16.0 (H) 4.0 - 10.5 K/uL   RBC 2.45 (L) 3.87 - 5.11 MIL/uL   Hemoglobin 7.1 (L) 12.0 - 15.0 g/dL   HCT 24.4 (L) 36.0 - 46.0 %   MCV 99.6 78.0 - 100.0 fL   MCH 29.0 26.0 - 34.0 pg   MCHC 29.1 (L) 30.0 - 36.0 g/dL   RDW 16.1 (H) 11.5 - 15.5 %   Platelets 375 150 - 400 K/uL  Basic metabolic panel     Status: Abnormal   Collection Time: 12/21/14  5:06 PM  Result Value Ref Range   Sodium 136 135 - 145 mmol/L   Potassium 2.9 (L) 3.5 - 5.1 mmol/L    Chloride 94 (L) 96 - 112 mmol/L   CO2 31 19 - 32 mmol/L   Glucose, Bld 171 (H) 70 - 99 mg/dL   BUN 20 6 - 23 mg/dL   Creatinine, Ser 4.62 (H) 0.50 - 1.10 mg/dL   Calcium 8.0 (L) 8.4 - 10.5 mg/dL   GFR calc non Af Amer 10 (L) >90 mL/min   GFR calc Af Amer 12 (L) >90 mL/min    Comment: (NOTE) The eGFR has been calculated using the CKD EPI equation.  This calculation has not been validated in all clinical situations. eGFR's persistently <90 mL/min signify possible Chronic Kidney Disease.    Anion gap 11 5 - 15  Type and screen     Status: None (Preliminary result)   Collection Time: 12/21/14  5:06 PM  Result Value Ref Range   ABO/RH(D) B POS    Antibody Screen NEG    Sample Expiration 12/24/2014    Unit Number [id number]    Blood Component Type RED CELLS,LR    Unit division 00    Status of Unit ISSUED,FINAL    Transfusion Status OK TO TRANSFUSE    Crossmatch Result Compatible    Unit Number [id number]    Blood Component Type RED CELLS,LR    Unit division 00    Status of Unit ISSUED    Transfusion Status OK TO TRANSFUSE    Crossmatch Result Compatible    Unit Number [id number]    Blood Component Type RED CELLS,LR    Unit division 00    Status of Unit ALLOCATED    Transfusion Status OK TO TRANSFUSE    Crossmatch Result Compatible   Glucose, capillary     Status: Abnormal   Collection Time: 12/21/14 10:00 PM  Result Value Ref Range   Glucose-Capillary 114 (H) 70 - 99 mg/dL  Prepare RBC     Status: None   Collection Time: 12/21/14 10:20 PM  Result Value Ref Range   Order Confirmation ORDER PROCESSED BY BLOOD BANK   MRSA PCR Screening     Status: None   Collection Time: 12/22/14  2:01 AM  Result Value Ref Range   MRSA by PCR NEGATIVE NEGATIVE    Comment:        The GeneXpert MRSA Assay (FDA approved for NASAL specimens only), is one component of a comprehensive MRSA colonization surveillance program. It is not intended to diagnose MRSA infection nor to guide  or monitor treatment for MRSA infections.   CBC     Status: Abnormal   Collection Time: 12/22/14  5:54 AM  Result Value Ref Range   WBC 10.6 (H) 4.0 - 10.5 K/uL    Comment: REPEATED TO VERIFY   RBC 2.24 (L) 3.87 - 5.11 MIL/uL   Hemoglobin 6.4 (LL) 12.0 - 15.0 g/dL    Comment: REPEATED TO VERIFY CRITICAL RESULT CALLED TO, READ BACK BY AND VERIFIED WITH: Kizzie Ide RN [phone] 12/22/2014 BY MACEDA, J    HCT 22.2 (L) 36.0 - 46.0 %   MCV 99.1 78.0 - 100.0 fL   MCH 28.6 26.0 - 34.0 pg   MCHC 28.8 (L) 30.0 - 36.0 g/dL   RDW 16.6 (H) 11.5 - 15.5 %   Platelets 303 150 - 400 K/uL  Comprehensive metabolic panel     Status: Abnormal   Collection Time: 12/22/14  5:54 AM  Result Value Ref Range   Sodium 140 135 - 145 mmol/L   Potassium 3.1 (L) 3.5 - 5.1 mmol/L   Chloride 97 96 - 112 mmol/L   CO2 28 19 - 32 mmol/L   Glucose, Bld 124 (H) 70 - 99 mg/dL   BUN 31 (H) 6 - 23 mg/dL   Creatinine, Ser 6.29 (H) 0.50 - 1.10 mg/dL   Calcium 8.1 (L) 8.4 - 10.5 mg/dL   Total Protein 7.8 6.0 - 8.3 g/dL   Albumin 2.0 (L) 3.5 - 5.2 g/dL   AST 8 0 - 37 U/L   ALT 6 0 - 35 U/L   Alkaline Phosphatase 101  39 - 117 U/L   Total Bilirubin 0.4 0.3 - 1.2 mg/dL   GFR calc non Af Amer 7 (L) >90 mL/min   GFR calc Af Amer 8 (L) >90 mL/min    Comment: (NOTE) The eGFR has been calculated using the CKD EPI equation. This calculation has not been validated in all clinical situations. eGFR's persistently <90 mL/min signify possible Chronic Kidney Disease.    Anion gap 15 5 - 15  TSH     Status: None   Collection Time: 12/22/14  5:54 AM  Result Value Ref Range   TSH 2.808 0.350 - 4.500 uIU/mL  Prepare RBC     Status: None   Collection Time: 12/22/14  7:54 AM  Result Value Ref Range   Order Confirmation ORDER PROCESSED BY BLOOD BANK   Glucose, capillary     Status: None   Collection Time: 12/22/14  7:54 AM  Result Value Ref Range   Glucose-Capillary 94 70 - 99 mg/dL  Reticulocytes     Status: Abnormal    Collection Time: 12/22/14  7:56 AM  Result Value Ref Range   Retic Ct Pct 4.6 (H) 0.4 - 3.1 %   RBC. 2.40 (L) 3.87 - 5.11 MIL/uL   Retic Count, Manual 110.4 19.0 - 186.0 K/uL    No results found.  Review of Systems  All other systems reviewed and are negative.  Blood pressure 107/47, pulse 62, temperature 97.9 F (36.6 C), temperature source Oral, resp. rate 16, SpO2 100 %. Physical Exam  Constitutional: She is oriented to person, place, and time. She appears well-developed and well-nourished. No distress.  Respiratory: Effort normal and breath sounds normal. She has no wheezes. She has no rales.  GI: Soft. Bowel sounds are normal.  Neurological: She is alert and oriented to person, place, and time.  Skin: She is not diaphoretic.  Extensive hidradenitis, open/draining sinus tract to left buttock, posterior thigh, perineum.  Small area to her left lateral back which is also draining.  Bilateral axillae-no drainage.    Psychiatric: She has a normal mood and affect. Her behavior is normal.    Assessment/Plan: Hidradenitis suppurativa: unfortunately, there is nothing we can offer for this patient.  May refer back to plastics and or a tertiary care facility because of the extent of the disease.  The areas seem to be draining well.  There is no need for incision and drainage.  Consider clindamycin and local wound care.  Surgery will sign off.  Please do not hesitate to call with any questions or concerns.   Sariya Trickey ANP-BC 12/22/2014, 10:46 AM

## 2014-12-22 NOTE — Progress Notes (Signed)
New Admission Note:  Arrival Method: via stretcher with nurse Tech Mental Orientation:Alert &orientedx4  Telemetry: Placed on box 12, CCMD notified Assessment: Completed Skin: Hydradenitis in groin, buttocks IV: R hand Pain: see Flowsheet Tubes: n/a Safety Measures: Safety Fall Prevention Plan was given, discussed and signed. Admission: Completed [address] [place] Orientation: Patient has been orientated to the room, unit and the staff. Family: Significant other at bedside  Orders have been reviewed and implemented. Will continue to monitor the patient. Call light has been placed within reach and bed alarm has been activated.   Leandro Reasoner BSN, RN  Phone Number: [phone] [facility]

## 2014-12-22 NOTE — Progress Notes (Signed)
1st unit PRBC's began.  VSS. Denies pain.Marland Kitchen

## 2014-12-22 NOTE — Progress Notes (Signed)
UR completed 

## 2014-12-22 NOTE — Consult Note (Signed)
Renal Service Consult Note [facility]  [name] 12/22/2014 [place] Requesting Physician:  Dr Tana Coast  Reason for Consult:  ESRD patient with chronic anemia presents w need for blood transfusion HPI: The patient is a 54 y.o. year-old with hx of ESRD on HD, hidradenitis suppurativa, HTN, obesity, low T4, DCM presenting sent from HD for low Hb on labs and also fatigue.  Hx of anemia requiring blood transfusions.  On max esa at OP HD.    Hx of hidradenitis suppurativa which is chronic condition but has intermittent flares w increased local inflammation and purulent drainage.  Takes abx for these flares about once every month or two for about 7 days.     Chart review: 8/08 - malig HTN, CHF flare, EF 40%, DM2, multinod goiter, HL, MO 1/11 - CHF flare, HTN, DM 10/11 - acute CHF flare, acute on CRF, DM, anemia, abd pain w ascites, CHF 10/11 - acute on chronic hidradenitis suppurativa, anasarca, CHF, low T4, DM2, CKD3, NICM 12/11 - acute RF on CKD, high K, DM2, anemia, fe def, NICM 3/12 - acute on chronic hidradenitis suppurativa w multiople I&D, Dm2, NICM 9/12 - acute on chronic anemia, LE edema, NICM 2/13 - A/C CHF, HTN, DM 5/13 - decomp CHF, CKD 3/4 DM,  6/13 - decomp CHF, acute on chron anemia, acute/ chronic RF, left hydro /stones > L ureteral stenting w improved creat 9/13 -  Fever/ sepsis due to flare of acute on chronic hidradenitis suppurativa 12/13 - refractory septic shock, LLL PNA w Ecoli bacteremia; cardiac arrest in hospital, acut e/chronic renal failure, CVVHD, transfusion.  New R hydro with ureteral stone, after sepsis resolved had removal of L ureteral stent and placement of new R ureteral stent. Bronch'd for failure to wean, then extubatged. Did not require HD.   2/14 - outpt labs showed high K > rx with kayexalate, no HD required, SIRS from acute on chronic hidradenitis suppurativa, + Cdif infection 4/14 - sent from office for Hb 6.4 > chronic blood/pus  oozing with chronic hidradenitis suppurativa .  Admitted for prbc transfusion 8/14 - acute CHF decomp, rx with lasix, CKD , VRE UTI rx 12 days abx, DM, rectovaginal fistula 12/14 - admit for right ureterscopy, kept overnight due to low SaO2, has COPD.  Dc'd home on po doxy.  Home O2   ROS  no fever, chills, sweats, no cp or sob   Past Medical History  Past Medical History  Diagnosis Date  . HTN (hypertension)   . Iron deficiency anemia   . Morbid obesity   . Hyperkalemia   . Hypothyroidism   . Hydradenitis   . Pneumonia Nov 10, 2012  . History of hemodialysis dec 2013    none currently  . Urinary tract infection     taking antibiotics for 3 days prior to surgery  . GERD (gastroesophageal reflux disease)   . H/O cardiac arrest 10/2012  . Hard of hearing 10/2012    now wears 2 hearing aids  . Depression   . SOB (shortness of breath)     hx of   . Sleep apnea     no test yet per mother 10/22/13   . DM type 2 (diabetes mellitus, type 2)     diet controlledwith retinopathy and nephropathy  . Multinodular goiter   . Hyperlipidemia   . DCM (dilated cardiomyopathy)     nonischemic  . Chronic diastolic CHF (congestive heart failure)   . Chronic kidney disease (CKD), stage  III (moderate)     [name] nephrology lov note 05-12-2013 on pt chart now on HD   Past Surgical History  Past Surgical History  Procedure Laterality Date  . Portacath placement    . Cystoscopy w/ ureteral stent placement  05/28/2012    Procedure: CYSTOSCOPY WITH RETROGRADE PYELOGRAM/URETERAL STENT PLACEMENT;  Surgeon: Reece Packer, MD;  [facility];  Service: Urology;  Laterality: Left;  . Cystoscopy w/ retrogrades  11/16/2012    Procedure: CYSTOSCOPY WITH RETROGRADE PYELOGRAM;  Surgeon: Reece Packer, MD;  [facility];  Service: Urology;  Laterality: Bilateral;  CYSTOSCOPY,BILATERAL RETROGRADE PYELOGRAM/ REMOVAL LEFT URETERAL STENT/ FULGERATION BLADDER MUCOSA/ INSERTION RIGHT URETERAL  STENT  . Port-a-cath removal    . Cystoscopy with retrograde pyelogram, ureteroscopy and stent placement Right 06/23/2013    Procedure: CYSTOSCOPY WITH RIGHT URETEROSCOPY, RIGHT  RETROGRADE PYELOGRAM, WITH LASER LIPOTRIPSY AND RIGHT URETERAL STENT EXCHANGE ;  Surgeon: Molli Hazard, MD;  [facility];  Service: Urology;  Laterality: Right;  STENT EXCHANGE    . Holmium laser application N/A [id number]    Procedure: HOLMIUM LASER APPLICATION;  Surgeon: Molli Hazard, MD;  [facility];  Service: Urology;  Laterality: N/A;  . Av fistula placement Left 07/25/2013    Procedure: ARTERIOVENOUS (AV) FISTULA CREATION ;  Surgeon: Rosetta Posner, MD;  Location: [place];  Service: Vascular;  Laterality: Left;  . Insertion of dialysis catheter N/A 09/24/2013    Procedure: INSERTION OF DIALYSIS CATHETER;  Surgeon: Rosetta Posner, MD;  Location: [facility];  Service: Vascular;  Laterality: N/A;  . Cystoscopy with retrograde pyelogram, ureteroscopy and stent placement Right 11/06/2013    Procedure: CYSTOSCOPY WITH RETROGRADE PYELOGRAM, URETEROSCOPY STONE REMOVAL AND STENT REMOVAL;  Surgeon: Molli Hazard, MD;  [facility];  Service: Urology;  Laterality: Right;   Family History  Family History  Problem Relation Age of Onset  . Coronary artery disease Mother   . Hypertension Mother   . Diabetes type II Mother   . Malignant hyperthermia Mother   . Coronary artery disease Father   . Hypertension Father   . Malignant hyperthermia Father   . Cancer Maternal Grandfather     ? Type  . Kidney failure Maternal Grandmother    Social History  reports that she quit smoking about 35 years ago. Her smoking use included Cigarettes. She has a .25 pack-year smoking history. She has never used smokeless tobacco. She reports that she does not drink alcohol or use illicit drugs. Allergies  Allergies  Allergen Reactions  . Amoxicillin-Pot Clavulanate Diarrhea  . Rosiglitazone Maleate Swelling     AVANDIA  . Amoxicillin Rash    Tolerated Zosyn 01/2013.  [name]   Home medications Prior to Admission medications   Medication Sig Start Date End Date Taking? Authorizing Provider  aspirin EC 81 MG tablet Take 81 mg by mouth every morning.    Yes Historical Provider, MD  Calcium Acetate 667 MG TABS 1 with each meal and 1 with snacks   Yes Historical Provider, MD  clonazePAM (KLONOPIN) 0.5 MG tablet Take 0.5 mg by mouth at bedtime.  10/22/13  Yes Historical Provider, MD  cyclobenzaprine (FLEXERIL) 10 MG tablet Take 1 tablet (10 mg total) by mouth 3 (three) times daily as needed for muscle spasms. For Spasms 12/03/12  Yes Janece Canterbury, MD  DULoxetine (CYMBALTA) 60 MG capsule Take 1 capsule (60 mg total) by mouth daily. 12/03/12  Yes Janece Canterbury, MD  famotidine (PEPCID)  20 MG tablet TAKE 1 TABLET BY MOUTH AT BEDTIME 10/15/14  Yes Tanda Rockers, MD  feeding supplement, RESOURCE BREEZE, (RESOURCE BREEZE) LIQD Take 1 Container by mouth 3 (three) times daily with meals.   Yes Historical Provider, MD  folic acid (FOLVITE) 1 MG tablet Take 1 tablet (1 mg total) by mouth daily. 12/03/12  Yes Janece Canterbury, MD  isosorbide mononitrate (IMDUR) 30 MG 24 hr tablet Take 30 mg by mouth every morning.   Yes Historical Provider, MD  levothyroxine (SYNTHROID, LEVOTHROID) 25 MCG tablet Take 50 mcg by mouth every morning. Take on an empty stomach 12/03/12  Yes Janece Canterbury, MD  minocycline (MINOCIN,DYNACIN) 100 MG capsule Take 100 mg by mouth 2 (two) times daily.   Yes Historical Provider, MD  oxyCODONE-acetaminophen (PERCOCET/ROXICET) 5-325 MG per tablet Take 1-2 tablets by mouth every 4 (four) hours as needed for pain. Patient has not taken since started dialysis 08/2013 due to causes hypotension 06/23/13  Yes Sharyn Creamer, MD  pravastatin (PRAVACHOL) 40 MG tablet Take 40 mg by mouth daily. Patient takes in am 12/03/12  Yes Janece Canterbury, MD  Probiotic Product (PROBIOTIC DAILY PO) Take by mouth.   Yes  Historical Provider, MD  saccharomyces boulardii (FLORASTOR) 250 MG capsule Take 1 capsule (250 mg total) by mouth 2 (two) times daily. 08/23/12  Yes Domenic Polite, MD  sodium hypochlorite (DAKIN'S 1/4 STRENGTH) 0.125 % SOLN Irrigate with 1 application as directed 2 (two) times daily. Apply to wound with dressing changes   Yes Historical Provider, MD  sucroferric oxyhydroxide (VELPHORO) 500 MG chewable tablet Chew 500 mg by mouth 3 (three) times daily with meals.   Yes Historical Provider, MD  nebivolol (BYSTOLIC) 10 MG tablet Take 1 tablet (10 mg total) by mouth daily. 12/22/14   Ripudeep Krystal Eaton, MD   Liver Function Tests  Recent Labs Lab 12/22/14 0554  AST 8  ALT 6  ALKPHOS 101  BILITOT 0.4  PROT 7.8  ALBUMIN 2.0*   No results for input(s): LIPASE, AMYLASE in the last 168 hours. CBC  Recent Labs Lab 12/21/14 1706 12/22/14 0554  WBC 16.0* 10.6*  HGB 7.1* 6.4*  HCT 24.4* 22.2*  MCV 99.6 99.1  PLT 375 [id number]   Basic Metabolic Panel  Recent Labs Lab 12/21/14 1706 12/22/14 0554  NA 136 140  K 2.9* 3.1*  CL 94* 97  CO2 31 28  GLUCOSE 171* 124*  BUN 20 31*  CREATININE 4.62* 6.29*  CALCIUM 8.0* 8.1*    Filed Vitals:   12/22/14 1315 12/22/14 1345 12/22/14 1412 12/22/14 1654  BP: 118/54 118/54 107/46 136/40  Pulse: 66 66 60 66  Temp: 98.1 F (36.7 C) 98.1 F (36.7 C) 98.2 F (36.8 C) 98.4 F (36.9 C)  TempSrc: Oral Oral Oral Oral  Resp: 16 16 16 16   SpO2: 100% 100% 100% 100%   Exam Alert, no distress, calm No rash, cyanosis or gangrene Sclera anicteric, throat clear No jvd Chest clear bilat RRR soft SEM no RG Abd obese, soft, NTND, no ascites GU perineal and inner thigh skin pocketed w crypts and abcesses diffusely No LE or UE edema   HD: [facility] 5h   F200   500/800   143kg   2/2.0 [place]   Heparin 14,000  LUA AVF Venofer 50/wk   Mircera 225 mcg every 2 weeks on Wed  Assessment: 1. Anemia , acute on chronic 2. ESRD on HD 3. Chronic hidradenitis  suppurativa 4. DM2 5. HTN  6. Volume - just under dry wt 7. COPD home O2 8. Obesity   Plan- getting blood transfusions today, will write HD orders in case she is here tomorrow.    Kelly Splinter MD (pgr) [phone]    ([id number] 12/22/2014, 7:09 PM

## 2014-12-22 NOTE — Progress Notes (Signed)
Patient ID: [name]  female  [id number]    DOB: [birth year]    DOA: 12/21/2014  PCP: [name], MD  Brief history of present illness  Patient is a 54 year old female with ESRD on hemodialysis, diabetes, COPD, chronic severe hiradenitis, chronic anemia presented with anemia from dialysis center. She had a routine blood work done and was found to be anemic with hemoglobin of 7.1. Patient actually reports that her baseline hemoglobin is ~7. Patient denied any chest pain or dizziness or lightheadedness. She has some shortness of breath and normally uses oxygen when she sleeps and when she has dialysis. She also reports that she chronically uses blood due to hiradenitis.   Assessment/Plan: Principal Problem:   Anemia: Due to chronic blood loss from hiradenitis lesions - Patient was transfused 1 unit packed RBC however hemoglobin still 6.4. Will transfuse 2 units of packed RBCs. I looked at the lesions myself, patient has oozing from hiradenitis. - She wants to be discharged after the blood transfusion.  Active Problems:  Severe chronic Hidradenitis suppurativa - Patient just started doxycycline outpatient on Saturday for 30 days, continue requested wound care consult however out of their scope of practice. Patient initially refused surgery evaluation as she has followed with [facility] and at [facility] and has had no benefit of the surgeries. Patient followed up with dermatology last week. Per surgery recommendations, Dr Ninfa Linden, patient has very large area of hiradenitis, patient completely understands her disease process and it is not curable. patient requires very large excision with eventual skin grafting. She was recommended to follow-up with plastic surgery and continue care at [facility] care center. Patient does not want any further workup regarding hiradenitis here. She is willing to have as needed transfusions and continue to follow up at [facility]. Per surgery,  areas or draining well and hence no need for incision and drainage.     DM type 2 (diabetes mellitus, type 2) - Continue sliding scale insulin     Chronic kidney disease (CKD), stage V - Patient will need hemodialysis tomorrow if still here, discussed with Dr. Jonnie Finner     COPD (chronic obstructive pulmonary disease)  currently stable  DVT Prophylaxis: SCDs   Code Status:  Family Communication: discussed in detail with the patient and her husband at bedside   Disposition:  Consultants: Wound care Nephrology  Gen. surgery   Procedures None  Antibiotics  Minocycline   Subjective  Patient seen and examined, Objective  Weight change:   Intake/Output Summary (Last 24 hours) at 12/22/14 1247 Last data filed at 12/22/14 0243  Gross per 24 hour  Intake      0 ml  Output      0 ml  Net      0 ml   Blood pressure 107/47, pulse 62, temperature 97.9 F (36.6 C), temperature source Oral, resp. rate 16, SpO2 100 %.  Physical Exam: General: Alert and awake, oriented x3, not in any acute distress. HEENT: anicteric sclera, PERLA, EOMI CVS: S1-S2 clear, no murmur rubs or gallops Chest: clear to auscultation bilaterally, no wheezing, rales or rhonchi Abdomen: soft nontender, nondistended, normal bowel sounds  Extremities: no cyanosis, clubbing or edema noted bilaterally Back : Multiple areas of extensive hiradenitis, open/draining tract to left buttock, posterior thigh and perineum  Neuro: Cranial nerves II-XII intact, no focal neurological deficits  Lab Results: Basic Metabolic Panel:  Recent Labs Lab 12/21/14 1706 12/22/14 0554  NA 136 140  K 2.9* 3.1*  CL  94* 97  CO2 31 28  GLUCOSE 171* 124*  BUN 20 31*  CREATININE 4.62* 6.29*  CALCIUM 8.0* 8.1*   Liver Function Tests:  Recent Labs Lab 12/22/14 0554  AST 8  ALT 6  ALKPHOS 101  BILITOT 0.4  PROT 7.8  ALBUMIN 2.0*   No results for input(s): LIPASE, AMYLASE in the last 168 hours. No results for  input(s): AMMONIA in the last 168 hours. CBC:  Recent Labs Lab 12/21/14 1706 12/22/14 0554  WBC 16.0* 10.6*  HGB 7.1* 6.4*  HCT 24.4* 22.2*  MCV 99.6 99.1  PLT 375 303   Cardiac Enzymes: No results for input(s): CKTOTAL, CKMB, CKMBINDEX, TROPONINI in the last 168 hours. BNP: Invalid input(s): POCBNP CBG:  Recent Labs Lab 12/21/14 2200 12/22/14 0754 12/22/14 1134  GLUCAP 114* 94 143*     Micro Results: Recent Results (from the past 240 hour(s))  MRSA PCR Screening     Status: None   Collection Time: 12/22/14  2:01 AM  Result Value Ref Range Status   MRSA by PCR NEGATIVE NEGATIVE Final    Comment:        The GeneXpert MRSA Assay (FDA approved for NASAL specimens only), is one component of a comprehensive MRSA colonization surveillance program. It is not intended to diagnose MRSA infection nor to guide or monitor treatment for MRSA infections.     Studies/Results: No results found.  Medications: Scheduled Meds: . aspirin EC  81 mg Oral q morning - 10a  . calcium acetate  667 mg Oral TID AC & HS  . clonazePAM  0.5 mg Oral QHS  . DULoxetine  60 mg Oral Daily  . famotidine  20 mg Oral QHS  . folic acid  1 mg Oral Daily  . insulin aspart  0-15 Units Subcutaneous TID WC  . isosorbide mononitrate  30 mg Oral q morning - 10a  . levothyroxine  50 mcg Oral QAC breakfast  . minocycline  100 mg Oral BID  . multivitamin with minerals  1 tablet Oral Daily  . nebivolol  10 mg Oral Daily  . pravastatin  40 mg Oral q1800  . saccharomyces boulardii  250 mg Oral BID  . sodium chloride  3 mL Intravenous Q12H  . sodium chloride  3 mL Intravenous Q12H  . sucroferric oxyhydroxide  500 mg Oral TID WC  . thiamine  100 mg Oral Daily      LOS: 1 day   Zamira Hickam M.D. [facility] 12/22/2014, 12:47 PM Pager: [phone]  If 7PM-7AM, please contact night-coverage [email or web address] Password [email or web address]

## 2014-12-22 NOTE — Progress Notes (Signed)
1st unit PRBC's complete, no apparent reaction.  VSS.

## 2014-12-23 LAB — TYPE AND SCREEN
ABO/RH(D): B POS
Antibody Screen: NEGATIVE
UNIT DIVISION: 0
Unit division: 0
Unit division: 0

## 2014-12-23 NOTE — Progress Notes (Signed)
Removed patient's right hand PIV.  No bleeding.  Reviewed discharge instructions with patient and her husband.  All questions answered.  Patient wheeled to car.  [employer] RN-BC, WTA.

## 2015-01-18 ENCOUNTER — Encounter: Payer: Self-pay | Admitting: Cardiology

## 2015-02-08 ENCOUNTER — Other Ambulatory Visit: Payer: Self-pay | Admitting: Family Medicine

## 2015-02-08 DIAGNOSIS — R921 Mammographic calcification found on diagnostic imaging of breast: Secondary | ICD-10-CM

## 2015-04-22 ENCOUNTER — Ambulatory Visit: Payer: BC Managed Care – PPO | Admitting: Cardiology

## 2015-04-29 ENCOUNTER — Ambulatory Visit: Payer: BC Managed Care – PPO | Admitting: Cardiology

## 2015-04-30 ENCOUNTER — Emergency Department (HOSPITAL_COMMUNITY): Payer: BC Managed Care – PPO

## 2015-04-30 ENCOUNTER — Inpatient Hospital Stay (HOSPITAL_COMMUNITY)
Admission: EM | Admit: 2015-04-30 | Discharge: 2015-05-02 | DRG: 811 | Disposition: A | Discharge status: Home / Self Care | Payer: BC Managed Care – PPO | Attending: Internal Medicine | Admitting: Internal Medicine

## 2015-04-30 ENCOUNTER — Encounter (HOSPITAL_COMMUNITY): Payer: Self-pay | Admitting: Emergency Medicine

## 2015-04-30 DIAGNOSIS — H9190 Unspecified hearing loss, unspecified ear: Secondary | ICD-10-CM | POA: Diagnosis present

## 2015-04-30 DIAGNOSIS — E785 Hyperlipidemia, unspecified: Secondary | ICD-10-CM | POA: Diagnosis present

## 2015-04-30 DIAGNOSIS — Z8249 Family history of ischemic heart disease and other diseases of the circulatory system: Secondary | ICD-10-CM

## 2015-04-30 DIAGNOSIS — N186 End stage renal disease: Secondary | ICD-10-CM | POA: Diagnosis present

## 2015-04-30 DIAGNOSIS — J449 Chronic obstructive pulmonary disease, unspecified: Secondary | ICD-10-CM | POA: Diagnosis present

## 2015-04-30 DIAGNOSIS — I120 Hypertensive chronic kidney disease with stage 5 chronic kidney disease or end stage renal disease: Secondary | ICD-10-CM | POA: Diagnosis present

## 2015-04-30 DIAGNOSIS — I420 Dilated cardiomyopathy: Secondary | ICD-10-CM | POA: Diagnosis present

## 2015-04-30 DIAGNOSIS — I5032 Chronic diastolic (congestive) heart failure: Secondary | ICD-10-CM | POA: Diagnosis present

## 2015-04-30 DIAGNOSIS — E119 Type 2 diabetes mellitus without complications: Secondary | ICD-10-CM

## 2015-04-30 DIAGNOSIS — E11319 Type 2 diabetes mellitus with unspecified diabetic retinopathy without macular edema: Secondary | ICD-10-CM | POA: Diagnosis present

## 2015-04-30 DIAGNOSIS — Z992 Dependence on renal dialysis: Secondary | ICD-10-CM | POA: Diagnosis not present

## 2015-04-30 DIAGNOSIS — D649 Anemia, unspecified: Principal | ICD-10-CM | POA: Diagnosis present

## 2015-04-30 DIAGNOSIS — G4730 Sleep apnea, unspecified: Secondary | ICD-10-CM | POA: Diagnosis present

## 2015-04-30 DIAGNOSIS — E039 Hypothyroidism, unspecified: Secondary | ICD-10-CM | POA: Diagnosis present

## 2015-04-30 DIAGNOSIS — Z87891 Personal history of nicotine dependence: Secondary | ICD-10-CM | POA: Diagnosis not present

## 2015-04-30 DIAGNOSIS — R509 Fever, unspecified: Secondary | ICD-10-CM | POA: Diagnosis not present

## 2015-04-30 DIAGNOSIS — L732 Hidradenitis suppurativa: Secondary | ICD-10-CM | POA: Diagnosis present

## 2015-04-30 DIAGNOSIS — E1122 Type 2 diabetes mellitus with diabetic chronic kidney disease: Secondary | ICD-10-CM | POA: Diagnosis present

## 2015-04-30 DIAGNOSIS — E1121 Type 2 diabetes mellitus with diabetic nephropathy: Secondary | ICD-10-CM | POA: Diagnosis present

## 2015-04-30 DIAGNOSIS — K219 Gastro-esophageal reflux disease without esophagitis: Secondary | ICD-10-CM | POA: Diagnosis present

## 2015-04-30 DIAGNOSIS — E876 Hypokalemia: Secondary | ICD-10-CM | POA: Diagnosis present

## 2015-04-30 DIAGNOSIS — J189 Pneumonia, unspecified organism: Secondary | ICD-10-CM

## 2015-04-30 DIAGNOSIS — Z833 Family history of diabetes mellitus: Secondary | ICD-10-CM | POA: Diagnosis not present

## 2015-04-30 DIAGNOSIS — L8990 Pressure ulcer of unspecified site, unspecified stage: Secondary | ICD-10-CM | POA: Diagnosis present

## 2015-04-30 LAB — CBC WITH DIFFERENTIAL/PLATELET
BASOS PCT: 0 % (ref 0–1)
Basophils Absolute: 0 10*3/uL (ref 0.0–0.1)
Eosinophils Absolute: 0.1 10*3/uL (ref 0.0–0.7)
Eosinophils Relative: 1 % (ref 0–5)
HCT: 23 % — ABNORMAL LOW (ref 36.0–46.0)
HEMOGLOBIN: 6.6 g/dL — AB (ref 12.0–15.0)
LYMPHS ABS: 0.7 10*3/uL (ref 0.7–4.0)
LYMPHS PCT: 6 % — AB (ref 12–46)
MCH: 28.6 pg (ref 26.0–34.0)
MCHC: 28.7 g/dL — AB (ref 30.0–36.0)
MCV: 99.6 fL (ref 78.0–100.0)
Monocytes Absolute: 0.8 10*3/uL (ref 0.1–1.0)
Monocytes Relative: 6 % (ref 3–12)
Neutro Abs: 10.4 10*3/uL — ABNORMAL HIGH (ref 1.7–7.7)
Neutrophils Relative %: 88 % — ABNORMAL HIGH (ref 43–77)
Platelets: 243 10*3/uL (ref 150–400)
RBC: 2.31 MIL/uL — ABNORMAL LOW (ref 3.87–5.11)
RDW: 16.5 % — AB (ref 11.5–15.5)
WBC: 11.8 10*3/uL — ABNORMAL HIGH (ref 4.0–10.5)

## 2015-04-30 LAB — PREPARE RBC (CROSSMATCH)

## 2015-04-30 LAB — I-STAT CHEM 8, ED
BUN: 23 mg/dL — AB (ref 6–20)
Calcium, Ion: 0.98 mmol/L — ABNORMAL LOW (ref 1.12–1.23)
Chloride: 94 mmol/L — ABNORMAL LOW (ref 101–111)
Creatinine, Ser: 4.8 mg/dL — ABNORMAL HIGH (ref 0.44–1.00)
Glucose, Bld: 265 mg/dL — ABNORMAL HIGH (ref 65–99)
HEMATOCRIT: 24 % — AB (ref 36.0–46.0)
Hemoglobin: 8.2 g/dL — ABNORMAL LOW (ref 12.0–15.0)
Potassium: 3.1 mmol/L — ABNORMAL LOW (ref 3.5–5.1)
Sodium: 137 mmol/L (ref 135–145)
TCO2: 27 mmol/L (ref 0–100)

## 2015-04-30 LAB — PROTIME-INR
INR: 1.4 (ref 0.00–1.49)
PROTHROMBIN TIME: 17.2 s — AB (ref 11.6–15.2)

## 2015-04-30 LAB — APTT: aPTT: 37 seconds (ref 24–37)

## 2015-04-30 LAB — I-STAT CG4 LACTIC ACID, ED: Lactic Acid, Venous: 3.14 mmol/L (ref 0.5–2.0)

## 2015-04-30 MED ORDER — SUCROFERRIC OXYHYDROXIDE 500 MG PO CHEW
500.0000 mg | CHEWABLE_TABLET | Freq: Three times a day (TID) | ORAL | Status: DC
  Administered 2015-05-01 – 2015-05-02 (×5): 500 mg via ORAL
  Filled 2015-04-30 (×7): qty 1

## 2015-04-30 MED ORDER — ACETAMINOPHEN 325 MG PO TABS: 650.0000 mg | ORAL_TABLET | Freq: Four times a day (QID) | ORAL | Status: DC | PRN

## 2015-04-30 MED ORDER — PRAVASTATIN SODIUM 40 MG PO TABS
40.0000 mg | ORAL_TABLET | Freq: Every day | ORAL | Status: DC
  Administered 2015-05-01 – 2015-05-02 (×2): 40 mg via ORAL
  Filled 2015-04-30 (×2): qty 1

## 2015-04-30 MED ORDER — FOLIC ACID 1 MG PO TABS
1.0000 mg | ORAL_TABLET | Freq: Every day | ORAL | Status: DC
  Administered 2015-05-01 – 2015-05-02 (×2): 1 mg via ORAL
  Filled 2015-04-30 (×2): qty 1

## 2015-04-30 MED ORDER — FAMOTIDINE 20 MG PO TABS
20.0000 mg | ORAL_TABLET | Freq: Every day | ORAL | Status: DC
  Administered 2015-05-01: 20 mg via ORAL
  Filled 2015-04-30 (×3): qty 1

## 2015-04-30 MED ORDER — INSULIN ASPART 100 UNIT/ML ~~LOC~~ SOLN: 0.0000 [IU] | Freq: Every day | SUBCUTANEOUS | Status: DC

## 2015-04-30 MED ORDER — BOOST / RESOURCE BREEZE PO LIQD: 1.0000 | Freq: Three times a day (TID) | ORAL | Status: DC

## 2015-04-30 MED ORDER — PIPERACILLIN-TAZOBACTAM 3.375 G IVPB 30 MIN
3.3750 g | INTRAVENOUS | Status: AC
  Administered 2015-04-30: 3.375 g via INTRAVENOUS
  Filled 2015-04-30: qty 50

## 2015-04-30 MED ORDER — ACETAMINOPHEN 650 MG RE SUPP: 650.0000 mg | Freq: Four times a day (QID) | RECTAL | Status: DC | PRN

## 2015-04-30 MED ORDER — LEVOTHYROXINE SODIUM 75 MCG PO TABS
75.0000 ug | ORAL_TABLET | Freq: Every day | ORAL | Status: DC
  Administered 2015-05-01 – 2015-05-02 (×2): 75 ug via ORAL
  Filled 2015-04-30 (×3): qty 1

## 2015-04-30 MED ORDER — ACETAMINOPHEN 325 MG PO TABS
650.0000 mg | ORAL_TABLET | Freq: Once | ORAL | Status: AC
  Administered 2015-04-30: 650 mg via ORAL
  Filled 2015-04-30: qty 2

## 2015-04-30 MED ORDER — SODIUM CHLORIDE 0.9 % IJ SOLN
3.0000 mL | Freq: Two times a day (BID) | INTRAMUSCULAR | Status: DC
  Administered 2015-05-01 – 2015-05-02 (×3): 3 mL via INTRAVENOUS

## 2015-04-30 MED ORDER — SODIUM CHLORIDE 0.9 % IV SOLN: INTRAVENOUS | Status: AC

## 2015-04-30 MED ORDER — SODIUM CHLORIDE 0.9 % IV BOLUS (SEPSIS)
1000.0000 mL | Freq: Once | INTRAVENOUS | Status: AC
  Administered 2015-04-30: 1000 mL via INTRAVENOUS

## 2015-04-30 MED ORDER — CYCLOBENZAPRINE HCL 10 MG PO TABS: 10.0000 mg | ORAL_TABLET | Freq: Three times a day (TID) | ORAL | Status: DC | PRN

## 2015-04-30 MED ORDER — SODIUM CHLORIDE 0.9 % IJ SOLN
3.0000 mL | Freq: Two times a day (BID) | INTRAMUSCULAR | Status: DC
  Administered 2015-05-01 (×3): 3 mL via INTRAVENOUS

## 2015-04-30 MED ORDER — CLONAZEPAM 0.5 MG PO TABS
0.5000 mg | ORAL_TABLET | Freq: Every day | ORAL | Status: DC
  Administered 2015-05-01: 0.5 mg via ORAL
  Filled 2015-04-30: qty 1

## 2015-04-30 MED ORDER — ASPIRIN EC 81 MG PO TBEC
81.0000 mg | DELAYED_RELEASE_TABLET | Freq: Every day | ORAL | Status: DC
  Administered 2015-05-01 – 2015-05-02 (×2): 81 mg via ORAL
  Filled 2015-04-30 (×2): qty 1

## 2015-04-30 MED ORDER — SODIUM CHLORIDE 0.9 % IV SOLN
10.0000 mL/h | Freq: Once | INTRAVENOUS | Status: AC
  Administered 2015-04-30: 10 mL/h via INTRAVENOUS

## 2015-04-30 MED ORDER — PIPERACILLIN-TAZOBACTAM IN DEX 2-0.25 GM/50ML IV SOLN
2.2500 g | Freq: Three times a day (TID) | INTRAVENOUS | Status: DC
  Administered 2015-05-01 – 2015-05-02 (×5): 2.25 g via INTRAVENOUS
  Filled 2015-04-30 (×6): qty 50

## 2015-04-30 MED ORDER — VANCOMYCIN HCL 10 G IV SOLR
2000.0000 mg | INTRAVENOUS | Status: AC
  Administered 2015-04-30: 2000 mg via INTRAVENOUS
  Filled 2015-04-30: qty 2000

## 2015-04-30 MED ORDER — INSULIN ASPART 100 UNIT/ML ~~LOC~~ SOLN
0.0000 [IU] | Freq: Three times a day (TID) | SUBCUTANEOUS | Status: DC
  Administered 2015-05-01 – 2015-05-02 (×2): 2 [IU] via SUBCUTANEOUS

## 2015-04-30 MED ORDER — NEBIVOLOL HCL 10 MG PO TABS
10.0000 mg | ORAL_TABLET | Freq: Every day | ORAL | Status: DC
  Administered 2015-05-01 – 2015-05-02 (×2): 10 mg via ORAL
  Filled 2015-04-30 (×2): qty 1

## 2015-04-30 MED ORDER — DULOXETINE HCL 60 MG PO CPEP
60.0000 mg | ORAL_CAPSULE | Freq: Every day | ORAL | Status: DC
  Administered 2015-05-01 – 2015-05-02 (×2): 60 mg via ORAL
  Filled 2015-04-30 (×2): qty 1

## 2015-04-30 MED ORDER — SODIUM CHLORIDE 0.9 % IV SOLN
Freq: Once | INTRAVENOUS | Status: AC
  Administered 2015-05-01: 01:00:00 via INTRAVENOUS

## 2015-04-30 MED ORDER — VANCOMYCIN HCL IN DEXTROSE 1-5 GM/200ML-% IV SOLN: 1000.0000 mg | INTRAVENOUS | Status: DC

## 2015-04-30 MED ORDER — OXYCODONE-ACETAMINOPHEN 5-325 MG PO TABS: 1.0000 | ORAL_TABLET | ORAL | Status: DC | PRN

## 2015-04-30 MED ORDER — ISOSORBIDE MONONITRATE ER 30 MG PO TB24
30.0000 mg | ORAL_TABLET | Freq: Every morning | ORAL | Status: DC
  Filled 2015-04-30: qty 1

## 2015-04-30 MED ORDER — SODIUM CHLORIDE 0.9 % IV SOLN: 250.0000 mL | INTRAVENOUS | Status: DC | PRN

## 2015-04-30 MED ORDER — SODIUM CHLORIDE 0.9 % IJ SOLN: 3.0000 mL | INTRAMUSCULAR | Status: DC | PRN

## 2015-04-30 NOTE — Progress Notes (Signed)
ANTIBIOTIC CONSULT NOTE - INITIAL  Pharmacy Consult for vancomycin + zosyn Indication: cellulitis  Allergies  Allergen Reactions  . Amoxicillin-Pot Clavulanate Diarrhea  . Rosiglitazone Maleate Swelling    AVANDIA  . Amoxicillin Rash    Tolerated Zosyn 01/2013.  [name]    Patient Measurements:   Adjusted Body Weight:   Vital Signs: Temp: 100.4 F (38 C) (06/03 1752) Temp Source: Oral (06/03 1752) BP: 87/45 mmHg (06/03 1752) Pulse Rate: 90 (06/03 1752) Intake/Output from previous day:   Intake/Output from this shift:    Labs:  Recent Labs  04/30/15 1811 04/30/15 1847  WBC 11.8*  --   HGB 6.6* 8.2*  PLT 243  --   CREATININE  --  4.80*   CrCl cannot be calculated (Unknown ideal weight.). No results for input(s): VANCOTROUGH, VANCOPEAK, VANCORANDOM, GENTTROUGH, GENTPEAK, GENTRANDOM, TOBRATROUGH, TOBRAPEAK, TOBRARND, AMIKACINPEAK, AMIKACINTROU, AMIKACIN in the last 72 hours.   Microbiology: No results found for this or any previous visit (from the past 720 hour(s)).  Medical History: Past Medical History  Diagnosis Date  . HTN (hypertension)   . Iron deficiency anemia   . Morbid obesity   . Hyperkalemia   . Hypothyroidism   . Hydradenitis   . Pneumonia Nov 10, 2012  . History of hemodialysis dec 2013    none currently  . Urinary tract infection     taking antibiotics for 3 days prior to surgery  . GERD (gastroesophageal reflux disease)   . H/O cardiac arrest 10/2012  . Hard of hearing 10/2012    now wears 2 hearing aids  . Depression   . SOB (shortness of breath)     hx of   . Sleep apnea     no test yet per mother 10/22/13   . DM type 2 (diabetes mellitus, type 2)     diet controlledwith retinopathy and nephropathy  . Multinodular goiter   . Hyperlipidemia   . DCM (dilated cardiomyopathy)     nonischemic  . Chronic diastolic CHF (congestive heart failure)   . Chronic kidney disease (CKD), stage III (moderate)     [name] nephrology lov note  05-12-2013 on pt chart now on HD    Medications:  Anti-infectives    Start     Dose/Rate Route Frequency Ordered Stop   05/03/15 1200  vancomycin (VANCOCIN) IVPB 1000 mg/200 mL premix     1,000 mg 200 mL/hr over 60 Minutes Intravenous Every M-W-F (Hemodialysis) 04/30/15 1932     05/01/15 0400  piperacillin-tazobactam (ZOSYN) IVPB 2.25 g     2.25 g 100 mL/hr over 30 Minutes Intravenous Every 8 hours 04/30/15 1932     04/30/15 1930  piperacillin-tazobactam (ZOSYN) IVPB 3.375 g     3.375 g 100 mL/hr over 30 Minutes Intravenous STAT 04/30/15 1924 05/01/15 1930   04/30/15 1930  vancomycin (VANCOCIN) 2,000 mg in sodium chloride 0.9 % 500 mL IVPB     2,000 mg 250 mL/hr over 120 Minutes Intravenous STAT 04/30/15 1924 05/01/15 1930     Assessment: 89 yof presented to the ED with SOB. To start vancomycin + zosyn for cellulitis. Tmax is 100.4 and WBC is 11.8. Pt is ESRD on HD. Received HD today.  Vanc 6/3>> Zosyn 6/3>>  Goal of Therapy:  Pre-HD vanc level 15-25  Plan:  - Vanc 2gm IV x 1 then 1gm post-HD MWF - Zosyn 3.375gm IV x 1 then 2.25gm IV Q8H - F/u renal fxn, C&S, clinical status and pre-HD vanc level  Clara Herbison,  Rande Lawman 04/30/2015,7:33 PM

## 2015-04-30 NOTE — ED Provider Notes (Signed)
CSN: [id number]     Arrival date & time 04/30/15  1705 History   First MD Initiated Contact with Patient 04/30/15 1841     Chief Complaint  Patient presents with  . Shortness of Breath     (Consider location/radiation/quality/duration/timing/severity/associated sxs/prior Treatment) The history is provided by the patient.  [name] is a 54 y.o. female hx of HTN, obesity, ESRD on HD (last HD was today) here with cough, shortness of breath, fever. Has some productive cough since yesterday as well as diffuse myalgias. Generally unwell feeling. Also noticed some more discharge and bleeding from her hidradenitis in her buttock area. She went to dialysis today and had labs drawn that showed hemoglobin 6.0. Denies any black stools. Sent here from dialysis for evaluation.    Past Medical History  Diagnosis Date  . HTN (hypertension)   . Iron deficiency anemia   . Morbid obesity   . Hyperkalemia   . Hypothyroidism   . Hydradenitis   . Pneumonia Nov 10, 2012  . History of hemodialysis dec 2013    none currently  . Urinary tract infection     taking antibiotics for 3 days prior to surgery  . GERD (gastroesophageal reflux disease)   . H/O cardiac arrest 10/2012  . Hard of hearing 10/2012    now wears 2 hearing aids  . Depression   . SOB (shortness of breath)     hx of   . Sleep apnea     no test yet per mother 10/22/13   . DM type 2 (diabetes mellitus, type 2)     diet controlledwith retinopathy and nephropathy  . Multinodular goiter   . Hyperlipidemia   . DCM (dilated cardiomyopathy)     nonischemic  . Chronic diastolic CHF (congestive heart failure)   . Chronic kidney disease (CKD), stage III (moderate)     [name] nephrology lov note 05-12-2013 on pt chart now on HD   Past Surgical History  Procedure Laterality Date  . Portacath placement    . Cystoscopy w/ ureteral stent placement  05/28/2012    Procedure: CYSTOSCOPY WITH RETROGRADE PYELOGRAM/URETERAL STENT PLACEMENT;   Surgeon: Reece Packer, MD;  Location: [facility];  Service: Urology;  Laterality: Left;  . Cystoscopy w/ retrogrades  11/16/2012    Procedure: CYSTOSCOPY WITH RETROGRADE PYELOGRAM;  Surgeon: Reece Packer, MD;  Location: [facility];  Service: Urology;  Laterality: Bilateral;  CYSTOSCOPY,BILATERAL RETROGRADE PYELOGRAM/ REMOVAL LEFT URETERAL STENT/ FULGERATION BLADDER MUCOSA/ INSERTION RIGHT URETERAL STENT  . Port-a-cath removal    . Cystoscopy with retrograde pyelogram, ureteroscopy and stent placement Right 06/23/2013    Procedure: CYSTOSCOPY WITH RIGHT URETEROSCOPY, RIGHT  RETROGRADE PYELOGRAM, WITH LASER LIPOTRIPSY AND RIGHT URETERAL STENT EXCHANGE ;  Surgeon: Molli Hazard, MD;  Location: [facility];  Service: Urology;  Laterality: Right;  STENT EXCHANGE    . Holmium laser application N/A [id number]    Procedure: HOLMIUM LASER APPLICATION;  Surgeon: Molli Hazard, MD;  Location: [facility];  Service: Urology;  Laterality: N/A;  . Av fistula placement Left 07/25/2013    Procedure: ARTERIOVENOUS (AV) FISTULA CREATION ;  Surgeon: Rosetta Posner, MD;  Location: [place];  Service: Vascular;  Laterality: Left;  . Insertion of dialysis catheter N/A 09/24/2013    Procedure: INSERTION OF DIALYSIS CATHETER;  Surgeon: Rosetta Posner, MD;  Location: [place];  Service: Vascular;  Laterality: N/A;  . Cystoscopy with retrograde pyelogram, ureteroscopy and stent placement Right 11/06/2013    Procedure:  CYSTOSCOPY WITH RETROGRADE PYELOGRAM, URETEROSCOPY STONE REMOVAL AND STENT REMOVAL;  Surgeon: Molli Hazard, MD;  Location: [facility];  Service: Urology;  Laterality: Right;   Family History  Problem Relation Age of Onset  . Coronary artery disease Mother   . Hypertension Mother   . Diabetes type II Mother   . Malignant hyperthermia Mother   . Coronary artery disease Father   . Hypertension Father   . Malignant hyperthermia Father   . Cancer Maternal Grandfather     ? Type  . Kidney failure  Maternal Grandmother    History  Substance Use Topics  . Smoking status: Former Smoker -- 0.25 packs/day for 1 years    Types: Cigarettes    Quit date: 11/28/1979  . Smokeless tobacco: Never Used  . Alcohol Use: No   OB History    No data available     Review of Systems  Respiratory: Positive for shortness of breath.   All other systems reviewed and are negative.     Allergies  Amoxicillin-pot clavulanate; Rosiglitazone maleate; and Amoxicillin  Home Medications   Prior to Admission medications   Medication Sig Start Date End Date Taking? Authorizing Provider  aspirin EC 81 MG tablet Take 81 mg by mouth daily.   Yes Historical Provider, MD  Calcium Acetate 667 MG TABS 1 with each meal and 1 with snacks   Yes Historical Provider, MD  clonazePAM (KLONOPIN) 0.5 MG tablet Take 0.5 mg by mouth at bedtime.  10/22/13  Yes Historical Provider, MD  cyclobenzaprine (FLEXERIL) 10 MG tablet Take 1 tablet (10 mg total) by mouth 3 (three) times daily as needed for muscle spasms. For Spasms 12/03/12  Yes Janece Canterbury, MD  DULoxetine (CYMBALTA) 60 MG capsule Take 1 capsule (60 mg total) by mouth daily. 12/03/12  Yes Janece Canterbury, MD  famotidine (PEPCID) 20 MG tablet TAKE 1 TABLET BY MOUTH AT BEDTIME 10/15/14  Yes Tanda Rockers, MD  feeding supplement, RESOURCE BREEZE, (RESOURCE BREEZE) LIQD Take 1 Container by mouth 3 (three) times daily with meals.   Yes Historical Provider, MD  folic acid (FOLVITE) 1 MG tablet Take 1 tablet (1 mg total) by mouth daily. 12/03/12  Yes Janece Canterbury, MD  isosorbide mononitrate (IMDUR) 30 MG 24 hr tablet Take 30 mg by mouth every morning.   Yes Historical Provider, MD  levothyroxine (SYNTHROID, LEVOTHROID) 25 MCG tablet Take 75 mcg by mouth every morning. Take on an empty stomach 12/03/12  Yes Janece Canterbury, MD  minocycline (MINOCIN,DYNACIN) 100 MG capsule Take 100 mg by mouth 2 (two) times daily.   Yes Historical Provider, MD  nebivolol (BYSTOLIC) 10 MG  tablet Take 1 tablet (10 mg total) by mouth daily. 12/22/14  Yes Ripudeep Krystal Eaton, MD  oxyCODONE-acetaminophen (PERCOCET/ROXICET) 5-325 MG per tablet Take 1-2 tablets by mouth every 4 (four) hours as needed for pain. Patient has not taken since started dialysis 08/2013 due to causes hypotension 06/23/13  Yes Rolan Bucco, MD  pravastatin (PRAVACHOL) 40 MG tablet Take 40 mg by mouth daily. Patient takes in am 12/03/12  Yes Janece Canterbury, MD  Probiotic Product (PROBIOTIC DAILY PO) Take 1 tablet by mouth daily.    Yes Historical Provider, MD  sodium hypochlorite (DAKIN'S 1/4 STRENGTH) 0.125 % SOLN Irrigate with 1 application as directed 2 (two) times daily. Apply to wound with dressing changes   Yes Historical Provider, MD  sucroferric oxyhydroxide (VELPHORO) 500 MG chewable tablet Chew 500 mg by mouth 3 (three) times daily with  meals.   Yes Historical Provider, MD   BP 88/39 mmHg  Pulse 74  Temp(Src) 100.4 F (38 C) (Oral)  Resp 20  SpO2 100% Physical Exam  Constitutional: She is oriented to person, place, and time.  Ill appearing   HENT:  Head: Normocephalic.  MM slightly dry   Eyes: Conjunctivae are normal. Pupils are equal, round, and reactive to light.  Neck: Normal range of motion. Neck supple.  Cardiovascular: Normal rate, regular rhythm and normal heart sounds.   Pulmonary/Chest: Effort normal.  Diminished bilateral bases   Abdominal: Soft. Bowel sounds are normal. She exhibits no distension. There is no tenderness. There is no rebound and no guarding.  Genitourinary:  Extensive hidradenitis buttock area with bleeding, some discharge. No obvious extension to the rectum   Musculoskeletal: Normal range of motion.  1+ edema bilaterally   Neurological: She is alert and oriented to person, place, and time.  Skin: Skin is warm. There is erythema.  Psychiatric: She has a normal mood and affect. Her behavior is normal. Judgment and thought content normal.  Nursing note and vitals  reviewed.   ED Course  Procedures (including critical care time)  CRITICAL CARE Performed by: Darl Householder, Carla Rashad   Total critical care time: 30 min   Critical care time was exclusive of separately billable procedures and treating other patients.  Critical care was necessary to treat or prevent imminent or life-threatening deterioration.  Critical care was time spent personally by me on the following activities: development of treatment plan with patient and/or surrogate as well as nursing, discussions with consultants, evaluation of patient's response to treatment, examination of patient, obtaining history from patient or surrogate, ordering and performing treatments and interventions, ordering and review of laboratory studies, ordering and review of radiographic studies, pulse oximetry and re-evaluation of patient's condition.   Labs Review Labs Reviewed  CBC WITH DIFFERENTIAL/PLATELET - Abnormal; Notable for the following:    WBC 11.8 (*)    RBC 2.31 (*)    Hemoglobin 6.6 (*)    HCT 23.0 (*)    MCHC 28.7 (*)    RDW 16.5 (*)    Neutrophils Relative % 88 (*)    Neutro Abs 10.4 (*)    Lymphocytes Relative 6 (*)    All other components within normal limits  PROTIME-INR - Abnormal; Notable for the following:    Prothrombin Time 17.2 (*)    All other components within normal limits  I-STAT CHEM 8, ED - Abnormal; Notable for the following:    Potassium 3.1 (*)    Chloride 94 (*)    BUN 23 (*)    Creatinine, Ser 4.80 (*)    Glucose, Bld 265 (*)    Calcium, Ion 0.98 (*)    Hemoglobin 8.2 (*)    HCT 24.0 (*)    All other components within normal limits  I-STAT CG4 LACTIC ACID, ED - Abnormal; Notable for the following:    Lactic Acid, Venous 3.14 (*)    All other components within normal limits  CULTURE, BLOOD (ROUTINE X 2)  CULTURE, BLOOD (ROUTINE X 2)  APTT  TYPE AND SCREEN  PREPARE RBC (CROSSMATCH)    Imaging Review Dg Chest Portable 1 View  04/30/2015   CLINICAL DATA:   Fever.  Code sepsis.  EXAM: PORTABLE CHEST - 1 VIEW  COMPARISON:  Two-view chest 11/06/2013.  FINDINGS: The heart is enlarged. Increased interstitial markings suggests edema. No focal airspace consolidation is present. Ill-defined right basilar airspace disease likely reflects  atelectasis. Lung volumes are low. The visualized soft tissues and bony thorax are unremarkable.  IMPRESSION: 1. Cardiomegaly and increased interstitial markings suggesting edema and mild congestive heart failure. 2. Ill-defined right basilar airspace disease likely reflects atelectasis. Early infection is also considered.   Electronically Signed   By: San Morelle M.D.   On: 04/30/2015 19:41     EKG Interpretation None      MDM   Final diagnoses:  Fever   Terrel Givins is a 54 y.o. female here with fever, hypotension. Sepsis likely from pneumonia vs hidra adenitis. Some bleeding from hidradenitis as well. Since it is so extensive, I want to do CT to check for abscess or hematoma. However, patient refused. She is ok with sepsis workup and transfusion and abx.   8:42 PM BP improved to 114/40 with 1 L NS. Held second liter since CXR showed pulm edema vs pneumonia. WBC 12. Lactate elevated. Given vanc/zosyn. Hg 6.6, transfused 1 U PRBC. Will admit to stepdown.     Wandra Arthurs, MD 04/30/15 2045

## 2015-04-30 NOTE — Progress Notes (Signed)
New Admission Note:   Arrival Method: Via stretcher from ED Mental Orientation:Alert and oriented x4,very HOH Telemetry: Box #13,NSR Assessment: Completed Skin: See docflowsheet IV: Rt W and RFA Pain: Denies Tubes: N/A Safety Measures: Safety Fall Prevention Plan has been given, discussed Admission: Completed [address] Orientation: Patient has been orientated to the room, unit and staff.  Family:  Orders have been reviewed and implemented. Will continue to monitor the patient. Call light has been placed within reach and bed alarm has been activated.   [employer], RN-BC Phone number: [phone]

## 2015-04-30 NOTE — ED Notes (Signed)
First set of cultures obtained; unable to obtain second set at this time

## 2015-04-30 NOTE — ED Notes (Signed)
Dr. Darl Householder made aware of elevated CG-4

## 2015-04-30 NOTE — ED Notes (Signed)
Pt sent to ED from Dialysis ref,. Shortness of breath and low Hgb,  Pt is on home 02 at 2LPM,  Pt was able to complete dialysis treatment today

## 2015-04-30 NOTE — Progress Notes (Signed)
Report obtained from ED RN. Lynell Greenhouse, Wonda Cheng, Therapist, sports

## 2015-04-30 NOTE — H&P (Signed)
[name] is an 54 y.o. female.     Chief Complaint: anemia HPI: 54 yo female with ESRD on HD, htn, dm2, chf (diastolic), hidradenitis, apparently c/o lightheadedness, and sob.  Found to be anemic at HD,  Send to ER for evaluation , found to be febrile, and bleeding from her hidradenitis. Pt denies n/v, abd pain, diarrhea, brbpr, black stool.  Pt will be admitted for fever, and hidradenitis, and anemia.    Past Medical History  Diagnosis Date  . HTN (hypertension)   . Iron deficiency anemia   . Morbid obesity   . Hyperkalemia   . Hypothyroidism   . Hydradenitis   . Pneumonia Nov 10, 2012  . History of hemodialysis dec 2013  . Urinary tract infection     taking antibiotics for 3 days prior to surgery  . GERD (gastroesophageal reflux disease)   . H/O cardiac arrest 10/2012  . Hard of hearing 10/2012    now wears 2 hearing aids  . Depression   . SOB (shortness of breath)     hx of   . Sleep apnea     no test yet per mother 10/22/13   . DM type 2 (diabetes mellitus, type 2)     diet controlledwith retinopathy and nephropathy  . Multinodular goiter   . Hyperlipidemia   . DCM (dilated cardiomyopathy)     nonischemic  . Chronic diastolic CHF (congestive heart failure)   . Chronic kidney disease (CKD), stage III (moderate)     [name] nephrology lov note 05-12-2013 on pt chart now on HD    Past Surgical History  Procedure Laterality Date  . Portacath placement    . Cystoscopy w/ ureteral stent placement  05/28/2012    Procedure: CYSTOSCOPY WITH RETROGRADE PYELOGRAM/URETERAL STENT PLACEMENT;  Surgeon: Reece Packer, MD;  Location: [facility];  Service: Urology;  Laterality: Left;  . Cystoscopy w/ retrogrades  11/16/2012    Procedure: CYSTOSCOPY WITH RETROGRADE PYELOGRAM;  Surgeon: Reece Packer, MD;  Location: [facility];  Service: Urology;  Laterality: Bilateral;  CYSTOSCOPY,BILATERAL RETROGRADE PYELOGRAM/ REMOVAL LEFT URETERAL STENT/ FULGERATION BLADDER MUCOSA/ INSERTION  RIGHT URETERAL STENT  . Port-a-cath removal    . Cystoscopy with retrograde pyelogram, ureteroscopy and stent placement Right 06/23/2013    Procedure: CYSTOSCOPY WITH RIGHT URETEROSCOPY, RIGHT  RETROGRADE PYELOGRAM, WITH LASER LIPOTRIPSY AND RIGHT URETERAL STENT EXCHANGE ;  Surgeon: Molli Hazard, MD;  Location: [facility];  Service: Urology;  Laterality: Right;  STENT EXCHANGE    . Holmium laser application N/A 07/14/5630    Procedure: HOLMIUM LASER APPLICATION;  Surgeon: Molli Hazard, MD;  Location: [facility];  Service: Urology;  Laterality: N/A;  . Av fistula placement Left 07/25/2013    Procedure: ARTERIOVENOUS (AV) FISTULA CREATION ;  Surgeon: Rosetta Posner, MD;  Location: [facility];  Service: Vascular;  Laterality: Left;  . Insertion of dialysis catheter N/A 09/24/2013    Procedure: INSERTION OF DIALYSIS CATHETER;  Surgeon: Rosetta Posner, MD;  Location[facility];  Service: Vascular;  Laterality: N/A;  . Cystoscopy with retrograde pyelogram, ureteroscopy and stent placement Right 11/06/2013    Procedure: CYSTOSCOPY WITH RETROGRADE PYELOGRAM, URETEROSCOPY STONE REMOVAL AND STENT REMOVAL;  Surgeon: Molli Hazard, MD;  Location: [facility];  Service: Urology;  Laterality: Right;    Family History  Problem Relation Age of Onset  . Coronary artery disease Mother   . Hypertension Mother   . Diabetes type II Mother   . Malignant hyperthermia  Mother   . Coronary artery disease Father   . Hypertension Father   . Malignant hyperthermia Father   . Cancer Maternal Grandfather     ? Type  . Kidney failure Maternal Grandmother    Social History:  reports that she quit smoking about 35 years ago. Her smoking use included Cigarettes. She has a .25 pack-year smoking history. She has never used smokeless tobacco. She reports that she does not drink alcohol or use illicit drugs.  Allergies:  Allergies  Allergen Reactions  . Amoxicillin-Pot Clavulanate Diarrhea  . Rosiglitazone Maleate Swelling     AVANDIA  . Amoxicillin Rash    Tolerated Zosyn 01/2013.  Remigio Eisenmenger   Medications reviewed   Results for orders placed or performed during the hospital encounter of 04/30/15 (from the past 48 hour(s))  CBC with Differential     Status: Abnormal   Collection Time: 04/30/15  6:11 PM  Result Value Ref Range   WBC 11.8 (H) 4.0 - 10.5 K/uL   RBC 2.31 (L) 3.87 - 5.11 MIL/uL   Hemoglobin 6.6 (LL) 12.0 - 15.0 g/dL    Comment: REPEATED TO VERIFY CRITICAL RESULT CALLED TO, READ BACK BY AND VERIFIED WITH: N STEPHENS RN 1857 04/30/15 A BROWNING    HCT 23.0 (L) 36.0 - 46.0 %   MCV 99.6 78.0 - 100.0 fL   MCH 28.6 26.0 - 34.0 pg   MCHC 28.7 (L) 30.0 - 36.0 g/dL   RDW 16.5 (H) 11.5 - 15.5 %   Platelets 243 150 - 400 K/uL   Neutrophils Relative % 88 (H) 43 - 77 %   Neutro Abs 10.4 (H) 1.7 - 7.7 K/uL   Lymphocytes Relative 6 (L) 12 - 46 %   Lymphs Abs 0.7 0.7 - 4.0 K/uL   Monocytes Relative 6 3 - 12 %   Monocytes Absolute 0.8 0.1 - 1.0 K/uL   Eosinophils Relative 1 0 - 5 %   Eosinophils Absolute 0.1 0.0 - 0.7 K/uL   Basophils Relative 0 0 - 1 %   Basophils Absolute 0.0 0.0 - 0.1 K/uL  Protime-INR     Status: Abnormal   Collection Time: 04/30/15  6:11 PM  Result Value Ref Range   Prothrombin Time 17.2 (H) 11.6 - 15.2 seconds   INR 1.40 0.00 - 1.49  APTT     Status: None   Collection Time: 04/30/15  6:11 PM  Result Value Ref Range   aPTT 37 24 - 37 seconds    Comment:        IF BASELINE aPTT IS ELEVATED, SUGGEST PATIENT RISK ASSESSMENT BE USED TO DETERMINE APPROPRIATE ANTICOAGULANT THERAPY.   Type and screen     Status: None (Preliminary result)   Collection Time: 04/30/15  6:11 PM  Result Value Ref Range   ABO/RH(D) B POS    Antibody Screen NEG    Sample Expiration 05/03/2015    Unit Number [id number]    Blood Component Type RED CELLS,LR    Unit division 00    Status of Unit ISSUED    Transfusion Status OK TO TRANSFUSE    Crossmatch Result Compatible   I-Stat Chem 8, ED      Status: Abnormal   Collection Time: 04/30/15  6:47 PM  Result Value Ref Range   Sodium 137 135 - 145 mmol/L   Potassium 3.1 (L) 3.5 - 5.1 mmol/L   Chloride 94 (L) 101 - 111 mmol/L   BUN 23 (H) 6 - 20 mg/dL  Creatinine, Ser 4.80 (H) 0.44 - 1.00 mg/dL   Glucose, Bld 265 (H) 65 - 99 mg/dL   Calcium, Ion 0.98 (L) 1.12 - 1.23 mmol/L   TCO2 27 0 - 100 mmol/L   Hemoglobin 8.2 (L) 12.0 - 15.0 g/dL   HCT 24.0 (L) 36.0 - 46.0 %  Prepare RBC     Status: None   Collection Time: 04/30/15  7:05 PM  Result Value Ref Range   Order Confirmation ORDER PROCESSED BY BLOOD BANK   I-Stat CG4 Lactic Acid, ED     Status: Abnormal   Collection Time: 04/30/15  7:28 PM  Result Value Ref Range   Lactic Acid, Venous 3.14 (HH) 0.5 - 2.0 mmol/L   Comment NOTIFIED PHYSICIAN    Dg Chest Portable 1 View  04/30/2015   CLINICAL DATA:  Fever.  Code sepsis.  EXAM: PORTABLE CHEST - 1 VIEW  COMPARISON:  Two-view chest 11/06/2013.  FINDINGS: The heart is enlarged. Increased interstitial markings suggests edema. No focal airspace consolidation is present. Ill-defined right basilar airspace disease likely reflects atelectasis. Lung volumes are low. The visualized soft tissues and bony thorax are unremarkable.  IMPRESSION: 1. Cardiomegaly and increased interstitial markings suggesting edema and mild congestive heart failure. 2. Ill-defined right basilar airspace disease likely reflects atelectasis. Early infection is also considered.   Electronically Signed   By: San Morelle M.D.   On: 04/30/2015 19:41    Review of Systems  Constitutional: Positive for fever. Negative for chills, weight loss, malaise/fatigue and diaphoresis.  HENT: Negative.   Eyes: Negative.   Respiratory: Positive for shortness of breath. Negative for cough, hemoptysis, sputum production and wheezing.   Cardiovascular: Negative.   Gastrointestinal: Negative for heartburn, nausea, vomiting, abdominal pain, diarrhea, constipation, blood in stool and  melena.  Genitourinary: Negative for dysuria, urgency, frequency, hematuria and flank pain.  Musculoskeletal: Negative.   Skin: Negative for itching.       + bleeding hidradenitis of the bilateral buttock  Neurological: Positive for dizziness. Negative for tingling, tremors, sensory change, speech change, focal weakness, seizures, loss of consciousness and weakness.  Endo/Heme/Allergies: Negative.   Psychiatric/Behavioral: Negative.     Blood pressure 110/34, pulse 72, temperature 100.4 F (38 C), temperature source Oral, resp. rate 16, SpO2 100 %. Physical Exam  Constitutional: She is oriented to person, place, and time. She appears well-developed and well-nourished.  HENT:  Head: Normocephalic and atraumatic.  Mouth/Throat: No oropharyngeal exudate.  Eyes: Conjunctivae and EOM are normal. Pupils are equal, round, and reactive to light. No scleral icterus.  Neck: Normal range of motion. Neck supple. No JVD present. No tracheal deviation present. No thyromegaly present.  Cardiovascular: Normal rate and regular rhythm.  Exam reveals no gallop and no friction rub.   No murmur heard. Respiratory: Effort normal and breath sounds normal. No respiratory distress. She has no wheezes. She has no rales.  GI: Soft. Bowel sounds are normal. She exhibits no distension. There is no tenderness. There is no rebound and no guarding.  Musculoskeletal: Normal range of motion. She exhibits no edema or tenderness.  Lymphadenopathy:    She has no cervical adenopathy.  Neurological: She is alert and oriented to person, place, and time. She has normal reflexes. She displays normal reflexes. No cranial nerve deficit. She exhibits normal muscle tone. Coordination normal.  Skin: Skin is warm.  + hidradenitis bilateral buttock, bleeding ? Infection on the left, AVF on the left upper arm  Psychiatric: She has a normal mood and affect.  Her behavior is normal. Judgment and thought content normal.      Assessment/Plan Fever seconadry to hidradenitis Blood culture x2,  Check esr If esr elevated then check cardiac echo tx with zosyn  Anemia seconadry to hidradenitis Check cbc in am after transfusion of 2 units prbc  ESRD on HD, please consult nephrology in am  Dm2 fsbs ac and qhs, iss  DVT prophylaxis:  Scd , no heparin due to bleeding from hidradenitits Jani Gravel 04/30/2015, 9:10 PM

## 2015-04-30 NOTE — ED Notes (Signed)
Pt ate 100 % of turkey sandwich

## 2015-05-01 DIAGNOSIS — L732 Hidradenitis suppurativa: Secondary | ICD-10-CM

## 2015-05-01 DIAGNOSIS — E1121 Type 2 diabetes mellitus with diabetic nephropathy: Secondary | ICD-10-CM

## 2015-05-01 DIAGNOSIS — R509 Fever, unspecified: Secondary | ICD-10-CM

## 2015-05-01 DIAGNOSIS — N186 End stage renal disease: Secondary | ICD-10-CM

## 2015-05-01 DIAGNOSIS — D649 Anemia, unspecified: Principal | ICD-10-CM

## 2015-05-01 DIAGNOSIS — L8990 Pressure ulcer of unspecified site, unspecified stage: Secondary | ICD-10-CM | POA: Diagnosis present

## 2015-05-01 LAB — RENAL FUNCTION PANEL
ALBUMIN: 1.9 g/dL — AB (ref 3.5–5.0)
Anion gap: 12 (ref 5–15)
BUN: 32 mg/dL — ABNORMAL HIGH (ref 6–20)
CALCIUM: 7.5 mg/dL — AB (ref 8.9–10.3)
CO2: 27 mmol/L (ref 22–32)
Chloride: 95 mmol/L — ABNORMAL LOW (ref 101–111)
Creatinine, Ser: 6.71 mg/dL — ABNORMAL HIGH (ref 0.44–1.00)
GFR calc Af Amer: 7 mL/min — ABNORMAL LOW (ref >60)
GFR, EST NON AFRICAN AMERICAN: 6 mL/min — AB (ref >60)
GLUCOSE: 210 mg/dL — AB (ref 65–99)
Phosphorus: 3.7 mg/dL (ref 2.5–4.6)
Potassium: 3.2 mmol/L — ABNORMAL LOW (ref 3.5–5.1)
Sodium: 134 mmol/L — ABNORMAL LOW (ref 135–145)

## 2015-05-01 LAB — GLUCOSE, CAPILLARY
GLUCOSE-CAPILLARY: 108 mg/dL — AB (ref 65–99)
GLUCOSE-CAPILLARY: 126 mg/dL — AB (ref 65–99)
GLUCOSE-CAPILLARY: 143 mg/dL — AB (ref 65–99)
Glucose-Capillary: 173 mg/dL — ABNORMAL HIGH (ref 65–99)
Glucose-Capillary: 196 mg/dL — ABNORMAL HIGH (ref 65–99)

## 2015-05-01 LAB — CBC
HCT: 24.8 % — ABNORMAL LOW (ref 36.0–46.0)
Hemoglobin: 7.6 g/dL — ABNORMAL LOW (ref 12.0–15.0)
MCH: 28.9 pg (ref 26.0–34.0)
MCHC: 30.6 g/dL (ref 30.0–36.0)
MCV: 94.3 fL (ref 78.0–100.0)
Platelets: 209 10*3/uL (ref 150–400)
RBC: 2.63 MIL/uL — ABNORMAL LOW (ref 3.87–5.11)
RDW: 18.4 % — ABNORMAL HIGH (ref 11.5–15.5)
WBC: 11.9 10*3/uL — ABNORMAL HIGH (ref 4.0–10.5)

## 2015-05-01 LAB — MRSA PCR SCREENING: MRSA by PCR: NEGATIVE

## 2015-05-01 MED ORDER — POTASSIUM CHLORIDE CRYS ER 20 MEQ PO TBCR
20.0000 meq | EXTENDED_RELEASE_TABLET | Freq: Once | ORAL | Status: AC
  Administered 2015-05-01: 20 meq via ORAL
  Filled 2015-05-01: qty 1

## 2015-05-01 NOTE — Progress Notes (Signed)
Patient refused lab draw this am. Akshay Spang Joselita, RN 

## 2015-05-01 NOTE — Progress Notes (Signed)
[facility] PROGRESS NOTE  [name] [id number] DOB: [birth year] DOA: 04/30/2015 PCP: [name], MD  Assessment/Plan: #1 symptomatic anemia Likely secondary to some bleeding hidradenitis which patient states is chronic. Patient status post 2 units packed red blood cells. Hemoglobin currently at 7.6. Follow H&H.  #2 hypokalemia Replete.  #3 fever/leukocytosis Likely secondary to possibly infected hidradenitis. Chest x-ray is negative for any acute infiltrate I would do show some pulmonary congestion. Will check a urinalysis. Blood cultures pending. Continue empiric IV vancomycin and IV Zosyn. Follow.  #4 end-stage renal disease on hemodialysis Patient is a Monday Wednesday Friday hemodialysis. If patient is still UL Monday will consult with nephrology.  #5 hidradenitis Likely infected. Patient with some induration and pus expressed. General surgical consultation was offered to patient however patient refusing consultation at this time as she states she is being seen in the past by general surgery and has always been told there is nothing that can be done. Blood cultures are pending. Continue empiric IV vancomycin and IV Zosyn.  #6 diabetes mellitus type 2 Hemoglobin A1c was 5.3 on 12/22/2014. CBGs have ranged from 108-173. Continue sliding scale insulin.  #7 hypothyroidism Continue Synthroid.  #8 prophylaxis Heparin for DVT prophylaxis.   Code Status: Full Family Communication: Updated patient and mother at bedside. Disposition Plan: Home when afebrile 24 hours and hemoglobin stable.   Consultants:  None  Procedures:  Chest x-ray 04/30/2015  Antibiotics:  IV Zosyn 04/30/2015  IV vancomycin 04/30/2015  HPI/Subjective: Patient states she's feeling better. Patient denies any lightheadedness. Patient denies any shortness of breath. Patient wants to be discharged. Patient refusing surgical consultation as she states every time she seen by general surgery  she still does not think that can be done. Patient noted to have a fever overnight and early this morning.  Objective: Filed Vitals:   05/01/15 0840  BP: 112/56  Pulse: 81  Temp: 100.2 F (37.9 C)  Resp: 20    Intake/Output Summary (Last 24 hours) at 05/01/15 1402 Last data filed at 05/01/15 1325  Gross per 24 hour  Intake 2520.04 ml  Output      0 ml  Net 2520.04 ml   There were no vitals filed for this visit.  Exam:   General:  NAD  Cardiovascular: RRR  Respiratory: CTAB  Abdomen: Obese, soft, nontender, nondistended, positive bowel sounds,  Musculoskeletal: No clubbing cyanosis or edema.  Skin: Hidradenitis bilateral buttocks and perineal area with some bleeding and some induration. Noted hidradenitis in the groin area.  Data Reviewed: Basic Metabolic Panel:  Recent Labs Lab 04/30/15 1847  NA 137  K 3.1*  CL 94*  GLUCOSE 265*  BUN 23*  CREATININE 4.80*   Liver Function Tests: No results for input(s): AST, ALT, ALKPHOS, BILITOT, PROT, ALBUMIN in the last 168 hours. No results for input(s): LIPASE, AMYLASE in the last 168 hours. No results for input(s): AMMONIA in the last 168 hours. CBC:  Recent Labs Lab 04/30/15 1811 04/30/15 1847  WBC 11.8*  --   NEUTROABS 10.4*  --   HGB 6.6* 8.2*  HCT 23.0* 24.0*  MCV 99.6  --   PLT 243  --    Cardiac Enzymes: No results for input(s): CKTOTAL, CKMB, CKMBINDEX, TROPONINI in the last 168 hours. BNP (last 3 results) No results for input(s): BNP in the last 8760 hours.  ProBNP (last 3 results) No results for input(s): PROBNP in the last 8760 hours.  CBG:  Recent Labs Lab 05/01/15 0106 05/01/15 1151  GLUCAP 173* 196*    Recent Results (from the past 240 hour(s))  MRSA PCR Screening     Status: None   Collection Time: 05/01/15  1:05 AM  Result Value Ref Range Status   MRSA by PCR NEGATIVE NEGATIVE Final    Comment:        The GeneXpert MRSA Assay (FDA approved for NASAL specimens only), is one  component of a comprehensive MRSA colonization surveillance program. It is not intended to diagnose MRSA infection nor to guide or monitor treatment for MRSA infections.      Studies: Dg Chest Portable 1 View  04/30/2015   CLINICAL DATA:  Fever.  Code sepsis.  EXAM: PORTABLE CHEST - 1 VIEW  COMPARISON:  Two-view chest 11/06/2013.  FINDINGS: The heart is enlarged. Increased interstitial markings suggests edema. No focal airspace consolidation is present. Ill-defined right basilar airspace disease likely reflects atelectasis. Lung volumes are low. The visualized soft tissues and bony thorax are unremarkable.  IMPRESSION: 1. Cardiomegaly and increased interstitial markings suggesting edema and mild congestive heart failure. 2. Ill-defined right basilar airspace disease likely reflects atelectasis. Early infection is also considered.   Electronically Signed   By: San Morelle M.D.   On: 04/30/2015 19:41    Scheduled Meds: . sodium chloride   Intravenous STAT  . aspirin EC  81 mg Oral Daily  . clonazePAM  0.5 mg Oral QHS  . DULoxetine  60 mg Oral Daily  . famotidine  20 mg Oral QHS  . feeding supplement (RESOURCE BREEZE)  1 Container Oral TID WC  . folic acid  1 mg Oral Daily  . insulin aspart  0-5 Units Subcutaneous QHS  . insulin aspart  0-9 Units Subcutaneous TID WC  . levothyroxine  75 mcg Oral QAC breakfast  . nebivolol  10 mg Oral Daily  . piperacillin-tazobactam (ZOSYN)  IV  2.25 g Intravenous Q8H  . pravastatin  40 mg Oral Daily  . sodium chloride  3 mL Intravenous Q12H  . sodium chloride  3 mL Intravenous Q12H  . sucroferric oxyhydroxide  500 mg Oral TID WC  . [START ON 05/03/2015] vancomycin  1,000 mg Intravenous Q M,W,F-HD   Continuous Infusions:   Principal Problem:   Symptomatic anemia Active Problems:   Morbid obesity   GERD   DM type 2 (diabetes mellitus, type 2)   Hidradenitis suppurativa   End stage renal disease   COPD (chronic obstructive pulmonary  disease)   Fever   Hidradenitis   Pressure ulcer    Time spent: 35 minus    Char Feltman M.D. [facility] Pager [phone]. If 7PM-7AM, please contact night-coverage at [email or web address], password [facility] 05/01/2015, 2:02 PM  LOS: 1 day

## 2015-05-02 DIAGNOSIS — D649 Anemia, unspecified: Secondary | ICD-10-CM | POA: Insufficient documentation

## 2015-05-02 LAB — TYPE AND SCREEN
ABO/RH(D): B POS
Antibody Screen: NEGATIVE
UNIT DIVISION: 0
Unit division: 0

## 2015-05-02 LAB — RENAL FUNCTION PANEL
ANION GAP: 13 (ref 5–15)
Albumin: 1.9 g/dL — ABNORMAL LOW (ref 3.5–5.0)
BUN: 40 mg/dL — ABNORMAL HIGH (ref 6–20)
CHLORIDE: 98 mmol/L — AB (ref 101–111)
CO2: 26 mmol/L (ref 22–32)
Calcium: 7.7 mg/dL — ABNORMAL LOW (ref 8.9–10.3)
Creatinine, Ser: 7.77 mg/dL — ABNORMAL HIGH (ref 0.44–1.00)
GFR calc Af Amer: 6 mL/min — ABNORMAL LOW (ref >60)
GFR calc non Af Amer: 5 mL/min — ABNORMAL LOW (ref >60)
Glucose, Bld: 110 mg/dL — ABNORMAL HIGH (ref 65–99)
POTASSIUM: 4 mmol/L (ref 3.5–5.1)
Phosphorus: 4.5 mg/dL (ref 2.5–4.6)
Sodium: 137 mmol/L (ref 135–145)

## 2015-05-02 LAB — CBC
HCT: 25.5 % — ABNORMAL LOW (ref 36.0–46.0)
Hemoglobin: 7.8 g/dL — ABNORMAL LOW (ref 12.0–15.0)
MCH: 28.8 pg (ref 26.0–34.0)
MCHC: 30.6 g/dL (ref 30.0–36.0)
MCV: 94.1 fL (ref 78.0–100.0)
Platelets: 207 10*3/uL (ref 150–400)
RBC: 2.71 MIL/uL — ABNORMAL LOW (ref 3.87–5.11)
RDW: 18.2 % — AB (ref 11.5–15.5)
WBC: 11 10*3/uL — ABNORMAL HIGH (ref 4.0–10.5)

## 2015-05-02 LAB — GLUCOSE, CAPILLARY
GLUCOSE-CAPILLARY: 171 mg/dL — AB (ref 65–99)
Glucose-Capillary: 105 mg/dL — ABNORMAL HIGH (ref 65–99)

## 2015-05-02 LAB — LACTIC ACID, PLASMA: LACTIC ACID, VENOUS: 1.3 mmol/L (ref 0.5–2.0)

## 2015-05-02 MED ORDER — CEPHALEXIN 500 MG PO CAPS: 500.0000 mg | ORAL_CAPSULE | Freq: Four times a day (QID) | ORAL | Status: AC

## 2015-05-02 MED ORDER — CEPHALEXIN 500 MG PO CAPS
500.0000 mg | ORAL_CAPSULE | Freq: Four times a day (QID) | ORAL | Status: DC
  Administered 2015-05-02: 500 mg via ORAL
  Filled 2015-05-02 (×4): qty 1

## 2015-05-02 NOTE — Discharge Summary (Signed)
Physician Discharge Summary  [name] [id number] DOB: 18-Jun-1961 DOA: 04/30/2015  PCP: [name], MD  Admit date: 04/30/2015 Discharge date: 05/02/2015  Time spent: 65 minutes  Recommendations for Outpatient Follow-up:  1. Follow-up with [name], MD in 1 week. On follow-up patient in needs a CBC done to follow-up on H&H. Patient's hidradenitis will need to be reassessed per PCP. Patient is being discharged on a seven-day course of oral Keflex. 2. Patient is to follow-up at her hemodialysis center tomorrow 05/03/2015  Discharge Diagnoses:  Principal Problem:   Symptomatic anemia Active Problems:   Hidradenitis   Morbid obesity   GERD   DM type 2 (diabetes mellitus, type 2)   Hidradenitis suppurativa   End stage renal disease   COPD (chronic obstructive pulmonary disease)   Fever   Pressure ulcer   Discharge Condition: Stable and improved  Diet recommendation: Heart healthy  There were no vitals filed for this visit.  History of present illness:  Per Dr Maudie Mercury 54 yo female with ESRD on HD, htn, dm2, chf (diastolic), hidradenitis, apparently c/o lightheadedness, and sob. Found to be anemic at HD, Send to ER for evaluation , found to be febrile, and bleeding from her hidradenitis. Pt denied n/v, abd pain, diarrhea, brbpr, black stool. Pt was admitted for fever, and hidradenitis, and anemia.    Hospital Course:  #1 symptomatic anemia Likely secondary to some bleeding hidradenitis which patient states is chronic. Patient was transfused 2 units packed red blood cells. Hemoglobin stabilized at 7.8 on day of discharge. Patient was discharged home in stable and improved condition. Patient was asymptomatic on day of discharge. Patient will follow-up with PCP one week post discharge.  #2 hypokalemia Repleted.  #3 fever/leukocytosis Likely secondary to possibly infected hidradenitis. Chest x-ray is negative for any acute infiltrate. Unable to obtain a urinalysis as  patient's end-stage renal disease on hemodialysis. Blood cultures were obtained and were pending at time of discharge. Patient improved clinically empirically on IV Zosyn during patient be discharged on a seven-day course of oral Keflex to complete a course of antibiotics therapy.   #4 end-stage renal disease on hemodialysis Patient is a Monday Wednesday Friday hemodialysis. Patient is to follow-up at her hemodialysis center.  #5 hidradenitis Likely infected. Patient with some induration and pus expressed. General surgical consultation was offered to patient however patient refusing consultation at this time as she states she is being seen in the past by general surgery and has always been told there is nothing that can be done. Blood cultures are pending. Patient was placed on IV vancomycin and IV Zosyn however patient on the received IV Zosyn during this hospitalization as IV vancomycin was to be given during hemodialysis. Patient improved clinically remained afebrile for 24 hours and will be discharged home on a seven-day course of oral Keflex. Patient is to follow-up with PCP as outpatient.   #6 diabetes mellitus type 2 Hemoglobin A1c was 5.3 on 12/22/2014. CBGs have ranged from 108-173. Patient was maintained on a sliding scale insulin.  #7 hypothyroidism Continued on home regimen Synthroid.   Procedures:  Chest x-ray 04/30/2015  2 units packed red blood cells 04/30/2015  Consultations:  None   Discharge Exam: Filed Vitals:   05/02/15 0917  BP: 100/49  Pulse: 60  Temp: 98.7 F (37.1 C)  Resp: 18    General: NAD Cardiovascular: RRR Respiratory: CTAB  Discharge Instructions   Discharge Instructions    Diet - low sodium heart healthy    Complete  by:  As directed      Discharge instructions    Complete by:  As directed   Follow up with [name], MD in 1 week. Follow up in HD tomorrow.     Increase activity slowly    Complete by:  As directed            Current Discharge Medication List    START taking these medications   Details  cephALEXin (KEFLEX) 500 MG capsule Take 1 capsule (500 mg total) by mouth every 6 (six) hours. Take for 7 days then stop. Qty: 28 capsule, Refills: 0      CONTINUE these medications which have NOT CHANGED   Details  aspirin EC 81 MG tablet Take 81 mg by mouth daily.    Calcium Acetate 667 MG TABS 1 with each meal and 1 with snacks    clonazePAM (KLONOPIN) 0.5 MG tablet Take 0.5 mg by mouth at bedtime.     cyclobenzaprine (FLEXERIL) 10 MG tablet Take 1 tablet (10 mg total) by mouth 3 (three) times daily as needed for muscle spasms. For Spasms Qty: 30 tablet, Refills: 0    DULoxetine (CYMBALTA) 60 MG capsule Take 1 capsule (60 mg total) by mouth daily. Qty: 30 capsule, Refills: 0    famotidine (PEPCID) 20 MG tablet TAKE 1 TABLET BY MOUTH AT BEDTIME Qty: 30 tablet, Refills: 0    feeding supplement, RESOURCE BREEZE, (RESOURCE BREEZE) LIQD Take 1 Container by mouth 3 (three) times daily with meals.    folic acid (FOLVITE) 1 MG tablet Take 1 tablet (1 mg total) by mouth daily. Qty: 30 tablet, Refills: 0    isosorbide mononitrate (IMDUR) 30 MG 24 hr tablet Take 30 mg by mouth every morning.    levothyroxine (SYNTHROID, LEVOTHROID) 25 MCG tablet Take 75 mcg by mouth every morning. Take on an empty stomach    minocycline (MINOCIN,DYNACIN) 100 MG capsule Take 100 mg by mouth 2 (two) times daily.    nebivolol (BYSTOLIC) 10 MG tablet Take 1 tablet (10 mg total) by mouth daily. Qty: 60 tablet, Refills: 0    oxyCODONE-acetaminophen (PERCOCET/ROXICET) 5-325 MG per tablet Take 1-2 tablets by mouth every 4 (four) hours as needed for pain. Patient has not taken since started dialysis 08/2013 due to causes hypotension    pravastatin (PRAVACHOL) 40 MG tablet Take 40 mg by mouth daily. Patient takes in am    Probiotic Product (PROBIOTIC DAILY PO) Take 1 tablet by mouth daily.     sodium hypochlorite (DAKIN'S  1/4 STRENGTH) 0.125 % SOLN Irrigate with 1 application as directed 2 (two) times daily. Apply to wound with dressing changes    sucroferric oxyhydroxide (VELPHORO) 500 MG chewable tablet Chew 500 mg by mouth 3 (three) times daily with meals.       Allergies  Allergen Reactions  . Amoxicillin-Pot Clavulanate Diarrhea  . Rosiglitazone Maleate Swelling    AVANDIA  . Amoxicillin Rash    Tolerated Zosyn 01/2013.  [name]   Follow-up Information    Follow up with [name], MD. Schedule an appointment as soon as possible for a visit in 1 week.   Specialty:  Family Medicine   Contact information:   [place] [address] [phone]       Follow up On 05/03/2015.   Why:  f/u in HD       The results of significant diagnostics from this hospitalization (including imaging, microbiology, ancillary and laboratory) are listed below for  reference.    Significant Diagnostic Studies: Dg Chest Portable 1 View  04/30/2015   CLINICAL DATA:  Fever.  Code sepsis.  EXAM: PORTABLE CHEST - 1 VIEW  COMPARISON:  Two-view chest 11/06/2013.  FINDINGS: The heart is enlarged. Increased interstitial markings suggests edema. No focal airspace consolidation is present. Ill-defined right basilar airspace disease likely reflects atelectasis. Lung volumes are low. The visualized soft tissues and bony thorax are unremarkable.  IMPRESSION: 1. Cardiomegaly and increased interstitial markings suggesting edema and mild congestive heart failure. 2. Ill-defined right basilar airspace disease likely reflects atelectasis. Early infection is also considered.   Electronically Signed   By: San Morelle M.D.   On: 04/30/2015 19:41    Microbiology: Recent Results (from the past 240 hour(s))  Blood culture (routine x 2)     Status: None (Preliminary result)   Collection Time: 04/30/15  7:20 PM  Result Value Ref Range Status   Specimen Description BLOOD RIGHT WRIST  Final   Special Requests  BOTTLES DRAWN AEROBIC AND ANAEROBIC 3ML  Final   Culture   Final           BLOOD CULTURE RECEIVED NO GROWTH TO DATE CULTURE WILL BE HELD FOR 5 DAYS BEFORE ISSUING A FINAL NEGATIVE REPORT Performed at [facility]    Report Status PENDING  Incomplete  Blood culture (routine x 2)     Status: None (Preliminary result)   Collection Time: 04/30/15  8:16 PM  Result Value Ref Range Status   Specimen Description BLOOD RIGHT HAND  Final   Special Requests   Final    BOTTLES DRAWN AEROBIC AND ANAEROBIC 5ML, PT ANTIBOTICS ZOSYN   Culture   Final           BLOOD CULTURE RECEIVED NO GROWTH TO DATE CULTURE WILL BE HELD FOR 5 DAYS BEFORE ISSUING A FINAL NEGATIVE REPORT Performed at [facility]    Report Status PENDING  Incomplete  MRSA PCR Screening     Status: None   Collection Time: 05/01/15  1:05 AM  Result Value Ref Range Status   MRSA by PCR NEGATIVE NEGATIVE Final    Comment:        The GeneXpert MRSA Assay (FDA approved for NASAL specimens only), is one component of a comprehensive MRSA colonization surveillance program. It is not intended to diagnose MRSA infection nor to guide or monitor treatment for MRSA infections.      Labs: Basic Metabolic Panel:  Recent Labs Lab 04/30/15 1847 05/01/15 1420 05/02/15 0613  NA 137 134* 137  K 3.1* 3.2* 4.0  CL 94* 95* 98*  CO2  --  27 26  GLUCOSE 265* 210* 110*  BUN 23* 32* 40*  CREATININE 4.80* 6.71* 7.77*  CALCIUM  --  7.5* 7.7*  PHOS  --  3.7 4.5   Liver Function Tests:  Recent Labs Lab 05/01/15 1420 05/02/15 0613  ALBUMIN 1.9* 1.9*   No results for input(s): LIPASE, AMYLASE in the last 168 hours. No results for input(s): AMMONIA in the last 168 hours. CBC:  Recent Labs Lab 04/30/15 1811 04/30/15 1847 05/01/15 1420 05/02/15 0613  WBC 11.8*  --  11.9* 11.0*  NEUTROABS 10.4*  --   --   --   HGB 6.6* 8.2* 7.6* 7.8*  HCT 23.0* 24.0* 24.8* 25.5*  MCV 99.6  --  94.3 94.1  PLT 243  --  209 207    Cardiac Enzymes: No results for input(s): CKTOTAL, CKMB, CKMBINDEX, TROPONINI in the  last 168 hours. BNP: BNP (last 3 results) No results for input(s): BNP in the last 8760 hours.  ProBNP (last 3 results) No results for input(s): PROBNP in the last 8760 hours.  CBG:  Recent Labs Lab 05/01/15 1151 05/01/15 1634 05/01/15 1700 05/01/15 2113 05/02/15 0734  GLUCAP 196* 126* 108* 143* 105*       Signed:  Chantavia Bazzle MD [facility] 05/02/2015, 11:09 AM

## 2015-05-03 LAB — GLUCOSE, CAPILLARY: GLUCOSE-CAPILLARY: 121 mg/dL — AB (ref 65–99)

## 2015-05-06 ENCOUNTER — Other Ambulatory Visit: Payer: Self-pay | Admitting: Family Medicine

## 2015-05-06 ENCOUNTER — Ambulatory Visit
Admission: RE | Admit: 2015-05-06 | Discharge: 2015-05-06 | Disposition: A | Discharge status: Home / Self Care | Payer: BC Managed Care – PPO | Source: Ambulatory Visit | Attending: Family Medicine | Admitting: Family Medicine

## 2015-05-06 DIAGNOSIS — R921 Mammographic calcification found on diagnostic imaging of breast: Secondary | ICD-10-CM

## 2015-05-07 LAB — CULTURE, BLOOD (ROUTINE X 2)
CULTURE: NO GROWTH
Culture: NO GROWTH

## 2015-05-20 ENCOUNTER — Ambulatory Visit
Admission: RE | Admit: 2015-05-20 | Discharge: 2015-05-20 | Disposition: A | Discharge status: Home / Self Care | Payer: BC Managed Care – PPO | Source: Ambulatory Visit | Attending: Family Medicine | Admitting: Family Medicine

## 2015-05-20 DIAGNOSIS — R921 Mammographic calcification found on diagnostic imaging of breast: Secondary | ICD-10-CM

## 2015-05-24 ENCOUNTER — Other Ambulatory Visit: Payer: Self-pay

## 2015-06-10 DIAGNOSIS — I871 Compression of vein: Secondary | ICD-10-CM | POA: Diagnosis not present

## 2015-06-10 DIAGNOSIS — T82858D Stenosis of vascular prosthetic devices, implants and grafts, subsequent encounter: Secondary | ICD-10-CM | POA: Diagnosis not present

## 2015-06-10 DIAGNOSIS — N186 End stage renal disease: Secondary | ICD-10-CM | POA: Diagnosis not present

## 2015-06-10 DIAGNOSIS — Z992 Dependence on renal dialysis: Secondary | ICD-10-CM | POA: Diagnosis not present

## 2015-07-08 DIAGNOSIS — I771 Stricture of artery: Secondary | ICD-10-CM | POA: Diagnosis not present

## 2015-07-08 DIAGNOSIS — N186 End stage renal disease: Secondary | ICD-10-CM | POA: Diagnosis not present

## 2015-07-08 DIAGNOSIS — Z992 Dependence on renal dialysis: Secondary | ICD-10-CM | POA: Diagnosis not present

## 2015-07-08 DIAGNOSIS — T82858D Stenosis of vascular prosthetic devices, implants and grafts, subsequent encounter: Secondary | ICD-10-CM | POA: Diagnosis not present

## 2015-07-14 ENCOUNTER — Other Ambulatory Visit (HOSPITAL_COMMUNITY): Payer: Self-pay | Admitting: *Deleted

## 2015-07-15 ENCOUNTER — Ambulatory Visit (HOSPITAL_COMMUNITY)
Admission: RE | Admit: 2015-07-15 | Discharge: 2015-07-15 | Disposition: A | Discharge status: Home / Self Care | Payer: BC Managed Care – PPO | Source: Ambulatory Visit | Attending: Nephrology | Admitting: Nephrology

## 2015-07-15 DIAGNOSIS — D649 Anemia, unspecified: Secondary | ICD-10-CM | POA: Diagnosis not present

## 2015-07-15 LAB — PREPARE RBC (CROSSMATCH)

## 2015-07-15 MED ORDER — SODIUM CHLORIDE 0.9 % IV SOLN: Freq: Once | INTRAVENOUS | Status: DC

## 2015-07-15 NOTE — Discharge Instructions (Signed)

## 2015-07-16 LAB — TYPE AND SCREEN
ABO/RH(D): B POS
Antibody Screen: NEGATIVE
UNIT DIVISION: 0
Unit division: 0

## 2015-07-22 ENCOUNTER — Ambulatory Visit: Payer: BC Managed Care – PPO | Admitting: Cardiology

## 2015-09-09 ENCOUNTER — Encounter: Payer: Self-pay | Admitting: Cardiology

## 2015-09-09 ENCOUNTER — Ambulatory Visit (INDEPENDENT_AMBULATORY_CARE_PROVIDER_SITE_OTHER): Payer: BC Managed Care – PPO | Admitting: Cardiology

## 2015-09-09 VITALS — BP 100/60 | HR 65 | Ht 66.5 in | Wt 336.8 lb

## 2015-09-09 DIAGNOSIS — I5032 Chronic diastolic (congestive) heart failure: Secondary | ICD-10-CM | POA: Diagnosis not present

## 2015-09-09 DIAGNOSIS — I10 Essential (primary) hypertension: Secondary | ICD-10-CM | POA: Diagnosis not present

## 2015-09-09 DIAGNOSIS — I420 Dilated cardiomyopathy: Secondary | ICD-10-CM

## 2015-09-09 MED ORDER — NEBIVOLOL HCL 5 MG PO TABS: 5.0000 mg | ORAL_TABLET | Freq: Every day | ORAL | Status: DC

## 2015-09-09 NOTE — Patient Instructions (Signed)
Medication Instructions:  Your physician has recommended you make the following change in your medication:  1) DECREASE BYSTOLIC to 5 mg daily  Labwork: None  Testing/Procedures: None  Follow-Up: Your physician wants you to follow-up in: 1 year with Dr. Radford Pax. You will receive a reminder letter in the mail two months in advance. If you don't receive a letter, please call our office to schedule the follow-up appointment.   Any Other Special Instructions Will Be Listed Below (If Applicable).

## 2015-09-09 NOTE — Progress Notes (Signed)
Cardiology Office Note   Date:  09/09/2015   ID:  [name], DOB 04-15-1961, MRN [id number]  PCP:  [name], MD    Chief Complaint  Patient presents with  . Congestive Heart Failure  . Hypertension  . Cardiomyopathy      History of Present Illness: [name] is a 54 y.o. female with a history of DCM now with normalized EF 50-55% by echo 8/14, obesity, chronic diastolic CHF, HTN who presents today for followup. She is doing well from a cardiac standpoint and denies any chest pain, dizziness or syncope. She does have some problems with hypotension during HD.  She has had chronic LE edema in the past but that has resolved. She has chronic SOB and has COPD and is on O2 at night. She only has DOE at HD.       Past Medical History  Diagnosis Date  . HTN (hypertension)   . Iron deficiency anemia   . Morbid obesity ([place])   . Hyperkalemia   . Hypothyroidism   . Hydradenitis   . Pneumonia Nov 10, 2012  . History of hemodialysis dec 2013  . Urinary tract infection     taking antibiotics for 3 days prior to surgery  . GERD (gastroesophageal reflux disease)   . H/O cardiac arrest 10/2012  . Hard of hearing 10/2012    now wears 2 hearing aids  . Depression   . SOB (shortness of breath)     hx of   . Sleep apnea     no test yet per mother 10/22/13   . DM type 2 (diabetes mellitus, type 2) ([facility])     diet controlledwith retinopathy and nephropathy  . Multinodular goiter   . Hyperlipidemia   . DCM (dilated cardiomyopathy) ([place])     nonischemic  . Chronic diastolic CHF (congestive heart failure) ([place])   . Chronic kidney disease (CKD), stage III (moderate)     [name] nephrology lov note 05-12-2013 on pt chart now on HD    Past Surgical History  Procedure Laterality Date  . Portacath placement    . Cystoscopy w/ ureteral stent placement  05/28/2012    Procedure: CYSTOSCOPY WITH RETROGRADE PYELOGRAM/URETERAL STENT PLACEMENT;  Surgeon: Reece Packer, MD;  Location: [facility];  Service: Urology;  Laterality: Left;  . Cystoscopy w/ retrogrades  11/16/2012    Procedure: CYSTOSCOPY WITH RETROGRADE PYELOGRAM;  Surgeon: Reece Packer, MD;  Location: [facility];  Service: Urology;  Laterality: Bilateral;  CYSTOSCOPY,BILATERAL RETROGRADE PYELOGRAM/ REMOVAL LEFT URETERAL STENT/ FULGERATION BLADDER MUCOSA/ INSERTION RIGHT URETERAL STENT  . Port-a-cath removal    . Cystoscopy with retrograde pyelogram, ureteroscopy and stent placement Right 06/23/2013    Procedure: CYSTOSCOPY WITH RIGHT URETEROSCOPY, RIGHT  RETROGRADE PYELOGRAM, WITH LASER LIPOTRIPSY AND RIGHT URETERAL STENT EXCHANGE ;  Surgeon: Molli Hazard, MD;  Location: [facility];  Service: Urology;  Laterality: Right;  STENT EXCHANGE    . Holmium laser application N/A [id number]    Procedure: HOLMIUM LASER APPLICATION;  Surgeon: Molli Hazard, MD;  Location: [facility];  Service: Urology;  Laterality: N/A;  . Av fistula placement Left 07/25/2013    Procedure: ARTERIOVENOUS (AV) FISTULA CREATION ;  Surgeon: Rosetta Posner, MD;  Location: [facility];  Service: Vascular;  Laterality: Left;  . Insertion of dialysis catheter N/A 09/24/2013    Procedure: INSERTION OF DIALYSIS CATHETER;  Surgeon: Sherren Mocha  Katina Dung, MD;  Location: [facility];  Service: Vascular;  Laterality: N/A;  . Cystoscopy with retrograde pyelogram, ureteroscopy and stent placement Right 11/06/2013    Procedure: CYSTOSCOPY WITH RETROGRADE PYELOGRAM, URETEROSCOPY STONE REMOVAL AND STENT REMOVAL;  Surgeon: Molli Hazard, MD;  Location: [facility];  Service: Urology;  Laterality: Right;     Current Outpatient Prescriptions  Medication Sig Dispense Refill  . aspirin EC 81 MG tablet Take 81 mg by mouth daily.    . Calcium Acetate 667 MG TABS 1 with each meal and 1 with snacks    . clindamycin (CLEOCIN T) 1 % external solution Apply 1 application topically 2 (two) times daily. TO THE AFFECTED AREAS    . clonazePAM (KLONOPIN) 0.5 MG  tablet Take 0.5 mg by mouth at bedtime.     . cyclobenzaprine (FLEXERIL) 10 MG tablet Take 1 tablet (10 mg total) by mouth 3 (three) times daily as needed for muscle spasms. For Spasms 30 tablet 0  . DULoxetine (CYMBALTA) 60 MG capsule Take 1 capsule (60 mg total) by mouth daily. 30 capsule 0  . ethyl chloride spray Apply 2 application topically 3 (three) times a week. PRE-DIALYSIS  10  . famotidine (PEPCID) 20 MG tablet TAKE 1 TABLET BY MOUTH AT BEDTIME 30 tablet 0  . feeding supplement, RESOURCE BREEZE, (RESOURCE BREEZE) LIQD Take 1 Container by mouth 3 (three) times daily with meals.    . isosorbide mononitrate (IMDUR) 30 MG 24 hr tablet Take 30 mg by mouth every morning.    Marland Kitchen levothyroxine (SYNTHROID, LEVOTHROID) 75 MCG tablet Take 75 mcg by mouth daily.  0  . minocycline (MINOCIN,DYNACIN) 100 MG capsule Take 100 mg by mouth 2 (two) times daily.    . multivitamin (RENA-VIT) TABS tablet Take 1 tablet by mouth daily.  6  . nebivolol (BYSTOLIC) 10 MG tablet Take 1 tablet (10 mg total) by mouth daily. 60 tablet 0  . oxyCODONE-acetaminophen (PERCOCET/ROXICET) 5-325 MG per tablet Take 1-2 tablets by mouth every 4 (four) hours as needed for pain. Patient has not taken since started dialysis 08/2013 due to causes hypotension    . pravastatin (PRAVACHOL) 40 MG tablet Take 40 mg by mouth daily. Patient takes in am    . Probiotic Product (PROBIOTIC DAILY PO) Take 1 tablet by mouth daily.     . SENSIPAR 60 MG tablet Take 120 mg by mouth daily. TAKE 120 MG BY MOUTH AT LARGEST MEAL OF THE DAY    . sodium hypochlorite (DAKIN'S 1/4 STRENGTH) 0.125 % SOLN Irrigate with 1 application as directed 2 (two) times daily. Apply to wound with dressing changes    . sucroferric oxyhydroxide (VELPHORO) 500 MG chewable tablet Chew 500 mg by mouth 3 (three) times daily with meals.     No current facility-administered medications for this visit.    Allergies:   Amoxicillin-pot clavulanate; Rosiglitazone maleate; and  Amoxicillin    Social History:  The patient  reports that she quit smoking about 35 years ago. Her smoking use included Cigarettes. She has a .25 pack-year smoking history. She has never used smokeless tobacco. She reports that she does not drink alcohol or use illicit drugs.   Family History:  The patient's family history includes Cancer in her maternal grandfather; Coronary artery disease in her father and mother; Diabetes type II in her mother; Hypertension in her father and mother; Kidney failure in her maternal grandmother; Malignant hyperthermia in her father and mother.    ROS:  Please see  the history of present illness.   Otherwise, review of systems are positive for none.   All other systems are reviewed and negative.    PHYSICAL EXAM: VS:  BP 100/60 mmHg  Pulse 65  Ht 5' 6.5" (1.689 m)  Wt 336 lb 12.8 oz (152.771 kg)  BMI 53.55 kg/m2  SpO2 94% , BMI Body mass index is 53.55 kg/(m^2). GEN: Well nourished, well developed, in no acute distress HEENT: normal Neck: no JVD, carotid bruits, or masses Cardiac: RRR; no murmurs, rubs, or gallops,no edema  Respiratory:  clear to auscultation bilaterally, normal work of breathing GI: soft, nontender, nondistended, + BS MS: no deformity or atrophy Skin: warm and dry, no rash Neuro:  Strength and sensation are intact Psych: euthymic mood, full affect   EKG:  EKG is not ordered today.    Recent Labs: 12/22/2014: ALT 6; TSH 2.808 05/02/2015: BUN 40*; Creatinine, Ser 7.77*; Hemoglobin 7.8*; Platelets 207; Potassium 4.0; Sodium 137    Lipid Panel    Component Value Date/Time   CHOL 80 01/20/2012 1639   TRIG 55 01/20/2012 1639   HDL 28* 01/20/2012 1639   CHOLHDL 2.9 01/20/2012 1639   VLDL 11 01/20/2012 1639   LDLCALC 41 01/20/2012 1639      Wt Readings from Last 3 Encounters:  09/09/15 336 lb 12.8 oz (152.771 kg)  07/15/15 313 lb 4 oz (142.089 kg)  10/15/14 313 lb (141.976 kg)      ASSESSMENT AND PLAN:  1. Chronic  diastolic CHF - her fluid is controlled on HD and appears compensated on exam - continue Bystolic 2. HTN - controlled and on the soft side.  She is having problems with hypotension during HD.  I will decrease her Bystolic to 5mg  daily.  3. DCM - EF low normal at 48% by stress myoview 04/2013 - continue Imdur/Bystolic - Hydralazine stopped in HD due to low BP 4. COPD on home O2 - per pulmonary 5. Morbid obesity  Current medicines are reviewed at length with the patient today.  The patient does not have concerns regarding medicines.  The following changes have been made:  no change  Labs/ tests ordered today: See above Assessment and Plan No orders of the defined types were placed in this encounter.     Disposition:   FU with me in 1 year  Signed, Sueanne Margarita, MD  09/09/2015 1:34 PM    [facility] [address], [place], [place]  [address] Phone: [phone]; Fax: [phone]

## 2015-09-28 ENCOUNTER — Other Ambulatory Visit: Payer: Self-pay

## 2015-09-28 DIAGNOSIS — Z1231 Encounter for screening mammogram for malignant neoplasm of breast: Secondary | ICD-10-CM

## 2015-10-14 ENCOUNTER — Ambulatory Visit: Payer: BC Managed Care – PPO

## 2015-10-28 ENCOUNTER — Ambulatory Visit
Admission: RE | Admit: 2015-10-28 | Discharge: 2015-10-28 | Disposition: A | Discharge status: Home / Self Care | Payer: BC Managed Care – PPO | Source: Ambulatory Visit

## 2015-10-28 DIAGNOSIS — Z1231 Encounter for screening mammogram for malignant neoplasm of breast: Secondary | ICD-10-CM

## 2015-12-23 DIAGNOSIS — I871 Compression of vein: Secondary | ICD-10-CM | POA: Diagnosis not present

## 2015-12-23 DIAGNOSIS — N186 End stage renal disease: Secondary | ICD-10-CM | POA: Diagnosis not present

## 2015-12-23 DIAGNOSIS — T82858D Stenosis of vascular prosthetic devices, implants and grafts, subsequent encounter: Secondary | ICD-10-CM | POA: Diagnosis not present

## 2015-12-23 DIAGNOSIS — Z992 Dependence on renal dialysis: Secondary | ICD-10-CM | POA: Diagnosis not present

## 2015-12-28 DIAGNOSIS — Z992 Dependence on renal dialysis: Secondary | ICD-10-CM | POA: Diagnosis not present

## 2015-12-28 DIAGNOSIS — E1129 Type 2 diabetes mellitus with other diabetic kidney complication: Secondary | ICD-10-CM | POA: Diagnosis not present

## 2015-12-28 DIAGNOSIS — N186 End stage renal disease: Secondary | ICD-10-CM | POA: Diagnosis not present

## 2016-01-04 ENCOUNTER — Ambulatory Visit (HOSPITAL_COMMUNITY)
Admission: RE | Admit: 2016-01-04 | Discharge: 2016-01-04 | Disposition: A | Discharge status: Home / Self Care | Payer: BC Managed Care – PPO | Source: Ambulatory Visit | Attending: Nephrology | Admitting: Nephrology

## 2016-01-04 DIAGNOSIS — N186 End stage renal disease: Secondary | ICD-10-CM | POA: Diagnosis present

## 2016-01-04 DIAGNOSIS — L732 Hidradenitis suppurativa: Secondary | ICD-10-CM | POA: Diagnosis not present

## 2016-01-04 LAB — PREPARE RBC (CROSSMATCH)

## 2016-01-04 MED ORDER — ACETAMINOPHEN 325 MG PO TABS
325.0000 mg | ORAL_TABLET | Freq: Once | ORAL | Status: AC
  Administered 2016-01-04: 325 mg via ORAL
  Filled 2016-01-04: qty 1

## 2016-01-04 MED ORDER — SODIUM CHLORIDE 0.9 % IV SOLN
Freq: Once | INTRAVENOUS | Status: AC
  Administered 2016-01-04: 11:00:00 via INTRAVENOUS

## 2016-01-04 MED ORDER — DIPHENHYDRAMINE HCL 25 MG PO CAPS
25.0000 mg | ORAL_CAPSULE | Freq: Once | ORAL | Status: AC
  Administered 2016-01-04: 25 mg via ORAL
  Filled 2016-01-04: qty 1

## 2016-01-04 NOTE — Progress Notes (Addendum)
Provider: Dr Lenna Sciara Deterding Diagnosis: D63.1- ESRD,  Hidradenitis suppurativa Procedure: Patient received 2 units of PRBC through peripheral IV.  No transfusion reactions observed.  Patient had no complaints at discharge.

## 2016-01-05 LAB — TYPE AND SCREEN
ABO/RH(D): B POS
ANTIBODY SCREEN: NEGATIVE
UNIT DIVISION: 0
UNIT DIVISION: 0

## 2016-01-06 DIAGNOSIS — N186 End stage renal disease: Secondary | ICD-10-CM | POA: Diagnosis not present

## 2016-01-06 DIAGNOSIS — T82858D Stenosis of vascular prosthetic devices, implants and grafts, subsequent encounter: Secondary | ICD-10-CM | POA: Diagnosis not present

## 2016-01-06 DIAGNOSIS — I771 Stricture of artery: Secondary | ICD-10-CM | POA: Diagnosis not present

## 2016-01-06 DIAGNOSIS — Z992 Dependence on renal dialysis: Secondary | ICD-10-CM | POA: Diagnosis not present

## 2016-01-25 DIAGNOSIS — E1129 Type 2 diabetes mellitus with other diabetic kidney complication: Secondary | ICD-10-CM | POA: Diagnosis not present

## 2016-01-25 DIAGNOSIS — N186 End stage renal disease: Secondary | ICD-10-CM | POA: Diagnosis not present

## 2016-01-25 DIAGNOSIS — Z992 Dependence on renal dialysis: Secondary | ICD-10-CM | POA: Diagnosis not present

## 2016-03-02 DIAGNOSIS — T82868D Thrombosis of vascular prosthetic devices, implants and grafts, subsequent encounter: Secondary | ICD-10-CM | POA: Diagnosis not present

## 2016-03-02 DIAGNOSIS — Z992 Dependence on renal dialysis: Secondary | ICD-10-CM | POA: Diagnosis not present

## 2016-03-02 DIAGNOSIS — I871 Compression of vein: Secondary | ICD-10-CM | POA: Diagnosis not present

## 2016-03-02 DIAGNOSIS — N186 End stage renal disease: Secondary | ICD-10-CM | POA: Diagnosis not present

## 2016-04-21 DIAGNOSIS — T82858D Stenosis of vascular prosthetic devices, implants and grafts, subsequent encounter: Secondary | ICD-10-CM | POA: Diagnosis not present

## 2016-04-21 DIAGNOSIS — N186 End stage renal disease: Secondary | ICD-10-CM | POA: Diagnosis not present

## 2016-04-21 DIAGNOSIS — Z992 Dependence on renal dialysis: Secondary | ICD-10-CM | POA: Diagnosis not present

## 2016-04-21 DIAGNOSIS — I771 Stricture of artery: Secondary | ICD-10-CM | POA: Diagnosis not present

## 2016-05-29 DIAGNOSIS — D631 Anemia in chronic kidney disease: Secondary | ICD-10-CM | POA: Diagnosis not present

## 2016-05-29 DIAGNOSIS — N2581 Secondary hyperparathyroidism of renal origin: Secondary | ICD-10-CM | POA: Diagnosis not present

## 2016-05-29 DIAGNOSIS — D509 Iron deficiency anemia, unspecified: Secondary | ICD-10-CM | POA: Diagnosis not present

## 2016-05-29 DIAGNOSIS — E1129 Type 2 diabetes mellitus with other diabetic kidney complication: Secondary | ICD-10-CM | POA: Diagnosis not present

## 2016-05-29 DIAGNOSIS — N186 End stage renal disease: Secondary | ICD-10-CM | POA: Diagnosis not present

## 2016-05-31 DIAGNOSIS — D631 Anemia in chronic kidney disease: Secondary | ICD-10-CM | POA: Diagnosis not present

## 2016-05-31 DIAGNOSIS — N186 End stage renal disease: Secondary | ICD-10-CM | POA: Diagnosis not present

## 2016-05-31 DIAGNOSIS — N2581 Secondary hyperparathyroidism of renal origin: Secondary | ICD-10-CM | POA: Diagnosis not present

## 2016-05-31 DIAGNOSIS — D509 Iron deficiency anemia, unspecified: Secondary | ICD-10-CM | POA: Diagnosis not present

## 2016-05-31 DIAGNOSIS — E1129 Type 2 diabetes mellitus with other diabetic kidney complication: Secondary | ICD-10-CM | POA: Diagnosis not present

## 2016-06-02 DIAGNOSIS — N186 End stage renal disease: Secondary | ICD-10-CM | POA: Diagnosis not present

## 2016-06-02 DIAGNOSIS — D631 Anemia in chronic kidney disease: Secondary | ICD-10-CM | POA: Diagnosis not present

## 2016-06-02 DIAGNOSIS — E1129 Type 2 diabetes mellitus with other diabetic kidney complication: Secondary | ICD-10-CM | POA: Diagnosis not present

## 2016-06-02 DIAGNOSIS — N2581 Secondary hyperparathyroidism of renal origin: Secondary | ICD-10-CM | POA: Diagnosis not present

## 2016-06-02 DIAGNOSIS — D509 Iron deficiency anemia, unspecified: Secondary | ICD-10-CM | POA: Diagnosis not present

## 2016-06-05 DIAGNOSIS — D509 Iron deficiency anemia, unspecified: Secondary | ICD-10-CM | POA: Diagnosis not present

## 2016-06-05 DIAGNOSIS — D631 Anemia in chronic kidney disease: Secondary | ICD-10-CM | POA: Diagnosis not present

## 2016-06-05 DIAGNOSIS — N2581 Secondary hyperparathyroidism of renal origin: Secondary | ICD-10-CM | POA: Diagnosis not present

## 2016-06-05 DIAGNOSIS — N186 End stage renal disease: Secondary | ICD-10-CM | POA: Diagnosis not present

## 2016-06-05 DIAGNOSIS — E1129 Type 2 diabetes mellitus with other diabetic kidney complication: Secondary | ICD-10-CM | POA: Diagnosis not present

## 2016-06-07 DIAGNOSIS — N2581 Secondary hyperparathyroidism of renal origin: Secondary | ICD-10-CM | POA: Diagnosis not present

## 2016-06-07 DIAGNOSIS — D631 Anemia in chronic kidney disease: Secondary | ICD-10-CM | POA: Diagnosis not present

## 2016-06-07 DIAGNOSIS — N186 End stage renal disease: Secondary | ICD-10-CM | POA: Diagnosis not present

## 2016-06-07 DIAGNOSIS — D509 Iron deficiency anemia, unspecified: Secondary | ICD-10-CM | POA: Diagnosis not present

## 2016-06-07 DIAGNOSIS — E1129 Type 2 diabetes mellitus with other diabetic kidney complication: Secondary | ICD-10-CM | POA: Diagnosis not present

## 2016-06-09 DIAGNOSIS — E1129 Type 2 diabetes mellitus with other diabetic kidney complication: Secondary | ICD-10-CM | POA: Diagnosis not present

## 2016-06-09 DIAGNOSIS — N2581 Secondary hyperparathyroidism of renal origin: Secondary | ICD-10-CM | POA: Diagnosis not present

## 2016-06-09 DIAGNOSIS — D631 Anemia in chronic kidney disease: Secondary | ICD-10-CM | POA: Diagnosis not present

## 2016-06-09 DIAGNOSIS — D509 Iron deficiency anemia, unspecified: Secondary | ICD-10-CM | POA: Diagnosis not present

## 2016-06-09 DIAGNOSIS — N186 End stage renal disease: Secondary | ICD-10-CM | POA: Diagnosis not present

## 2016-06-12 DIAGNOSIS — D631 Anemia in chronic kidney disease: Secondary | ICD-10-CM | POA: Diagnosis not present

## 2016-06-12 DIAGNOSIS — N186 End stage renal disease: Secondary | ICD-10-CM | POA: Diagnosis not present

## 2016-06-12 DIAGNOSIS — D509 Iron deficiency anemia, unspecified: Secondary | ICD-10-CM | POA: Diagnosis not present

## 2016-06-12 DIAGNOSIS — E1129 Type 2 diabetes mellitus with other diabetic kidney complication: Secondary | ICD-10-CM | POA: Diagnosis not present

## 2016-06-12 DIAGNOSIS — N2581 Secondary hyperparathyroidism of renal origin: Secondary | ICD-10-CM | POA: Diagnosis not present

## 2016-06-14 DIAGNOSIS — N2581 Secondary hyperparathyroidism of renal origin: Secondary | ICD-10-CM | POA: Diagnosis not present

## 2016-06-14 DIAGNOSIS — E1129 Type 2 diabetes mellitus with other diabetic kidney complication: Secondary | ICD-10-CM | POA: Diagnosis not present

## 2016-06-14 DIAGNOSIS — D631 Anemia in chronic kidney disease: Secondary | ICD-10-CM | POA: Diagnosis not present

## 2016-06-14 DIAGNOSIS — D509 Iron deficiency anemia, unspecified: Secondary | ICD-10-CM | POA: Diagnosis not present

## 2016-06-14 DIAGNOSIS — N186 End stage renal disease: Secondary | ICD-10-CM | POA: Diagnosis not present

## 2016-06-16 DIAGNOSIS — N186 End stage renal disease: Secondary | ICD-10-CM | POA: Diagnosis not present

## 2016-06-16 DIAGNOSIS — E1129 Type 2 diabetes mellitus with other diabetic kidney complication: Secondary | ICD-10-CM | POA: Diagnosis not present

## 2016-06-16 DIAGNOSIS — D509 Iron deficiency anemia, unspecified: Secondary | ICD-10-CM | POA: Diagnosis not present

## 2016-06-16 DIAGNOSIS — D631 Anemia in chronic kidney disease: Secondary | ICD-10-CM | POA: Diagnosis not present

## 2016-06-16 DIAGNOSIS — N2581 Secondary hyperparathyroidism of renal origin: Secondary | ICD-10-CM | POA: Diagnosis not present

## 2016-06-19 DIAGNOSIS — E1129 Type 2 diabetes mellitus with other diabetic kidney complication: Secondary | ICD-10-CM | POA: Diagnosis not present

## 2016-06-19 DIAGNOSIS — N2581 Secondary hyperparathyroidism of renal origin: Secondary | ICD-10-CM | POA: Diagnosis not present

## 2016-06-19 DIAGNOSIS — D631 Anemia in chronic kidney disease: Secondary | ICD-10-CM | POA: Diagnosis not present

## 2016-06-19 DIAGNOSIS — N186 End stage renal disease: Secondary | ICD-10-CM | POA: Diagnosis not present

## 2016-06-19 DIAGNOSIS — D509 Iron deficiency anemia, unspecified: Secondary | ICD-10-CM | POA: Diagnosis not present

## 2016-06-20 DIAGNOSIS — I871 Compression of vein: Secondary | ICD-10-CM | POA: Diagnosis not present

## 2016-06-20 DIAGNOSIS — Z992 Dependence on renal dialysis: Secondary | ICD-10-CM | POA: Diagnosis not present

## 2016-06-20 DIAGNOSIS — N186 End stage renal disease: Secondary | ICD-10-CM | POA: Diagnosis not present

## 2016-06-20 DIAGNOSIS — T82858D Stenosis of vascular prosthetic devices, implants and grafts, subsequent encounter: Secondary | ICD-10-CM | POA: Diagnosis not present

## 2016-06-21 DIAGNOSIS — D631 Anemia in chronic kidney disease: Secondary | ICD-10-CM | POA: Diagnosis not present

## 2016-06-21 DIAGNOSIS — N186 End stage renal disease: Secondary | ICD-10-CM | POA: Diagnosis not present

## 2016-06-21 DIAGNOSIS — N2581 Secondary hyperparathyroidism of renal origin: Secondary | ICD-10-CM | POA: Diagnosis not present

## 2016-06-21 DIAGNOSIS — D509 Iron deficiency anemia, unspecified: Secondary | ICD-10-CM | POA: Diagnosis not present

## 2016-06-21 DIAGNOSIS — E1129 Type 2 diabetes mellitus with other diabetic kidney complication: Secondary | ICD-10-CM | POA: Diagnosis not present

## 2016-06-23 DIAGNOSIS — N2581 Secondary hyperparathyroidism of renal origin: Secondary | ICD-10-CM | POA: Diagnosis not present

## 2016-06-23 DIAGNOSIS — D509 Iron deficiency anemia, unspecified: Secondary | ICD-10-CM | POA: Diagnosis not present

## 2016-06-23 DIAGNOSIS — N186 End stage renal disease: Secondary | ICD-10-CM | POA: Diagnosis not present

## 2016-06-23 DIAGNOSIS — R55 Syncope and collapse: Secondary | ICD-10-CM | POA: Diagnosis not present

## 2016-06-23 DIAGNOSIS — E1129 Type 2 diabetes mellitus with other diabetic kidney complication: Secondary | ICD-10-CM | POA: Diagnosis not present

## 2016-06-23 DIAGNOSIS — D631 Anemia in chronic kidney disease: Secondary | ICD-10-CM | POA: Diagnosis not present

## 2016-06-23 DIAGNOSIS — R404 Transient alteration of awareness: Secondary | ICD-10-CM | POA: Diagnosis not present

## 2016-06-26 DIAGNOSIS — E1129 Type 2 diabetes mellitus with other diabetic kidney complication: Secondary | ICD-10-CM | POA: Diagnosis not present

## 2016-06-26 DIAGNOSIS — Z992 Dependence on renal dialysis: Secondary | ICD-10-CM | POA: Diagnosis not present

## 2016-06-26 DIAGNOSIS — D509 Iron deficiency anemia, unspecified: Secondary | ICD-10-CM | POA: Diagnosis not present

## 2016-06-26 DIAGNOSIS — N2581 Secondary hyperparathyroidism of renal origin: Secondary | ICD-10-CM | POA: Diagnosis not present

## 2016-06-26 DIAGNOSIS — D631 Anemia in chronic kidney disease: Secondary | ICD-10-CM | POA: Diagnosis not present

## 2016-06-26 DIAGNOSIS — N186 End stage renal disease: Secondary | ICD-10-CM | POA: Diagnosis not present

## 2016-06-28 DIAGNOSIS — N2581 Secondary hyperparathyroidism of renal origin: Secondary | ICD-10-CM | POA: Diagnosis not present

## 2016-06-28 DIAGNOSIS — D631 Anemia in chronic kidney disease: Secondary | ICD-10-CM | POA: Diagnosis not present

## 2016-06-28 DIAGNOSIS — N186 End stage renal disease: Secondary | ICD-10-CM | POA: Diagnosis not present

## 2016-06-28 DIAGNOSIS — E1129 Type 2 diabetes mellitus with other diabetic kidney complication: Secondary | ICD-10-CM | POA: Diagnosis not present

## 2016-06-28 DIAGNOSIS — D509 Iron deficiency anemia, unspecified: Secondary | ICD-10-CM | POA: Diagnosis not present

## 2016-06-30 DIAGNOSIS — E1129 Type 2 diabetes mellitus with other diabetic kidney complication: Secondary | ICD-10-CM | POA: Diagnosis not present

## 2016-06-30 DIAGNOSIS — N2581 Secondary hyperparathyroidism of renal origin: Secondary | ICD-10-CM | POA: Diagnosis not present

## 2016-06-30 DIAGNOSIS — D509 Iron deficiency anemia, unspecified: Secondary | ICD-10-CM | POA: Diagnosis not present

## 2016-06-30 DIAGNOSIS — D631 Anemia in chronic kidney disease: Secondary | ICD-10-CM | POA: Diagnosis not present

## 2016-06-30 DIAGNOSIS — N186 End stage renal disease: Secondary | ICD-10-CM | POA: Diagnosis not present

## 2016-07-03 DIAGNOSIS — N186 End stage renal disease: Secondary | ICD-10-CM | POA: Diagnosis not present

## 2016-07-03 DIAGNOSIS — D509 Iron deficiency anemia, unspecified: Secondary | ICD-10-CM | POA: Diagnosis not present

## 2016-07-03 DIAGNOSIS — N2581 Secondary hyperparathyroidism of renal origin: Secondary | ICD-10-CM | POA: Diagnosis not present

## 2016-07-03 DIAGNOSIS — D631 Anemia in chronic kidney disease: Secondary | ICD-10-CM | POA: Diagnosis not present

## 2016-07-03 DIAGNOSIS — E1129 Type 2 diabetes mellitus with other diabetic kidney complication: Secondary | ICD-10-CM | POA: Diagnosis not present

## 2016-07-05 DIAGNOSIS — E1129 Type 2 diabetes mellitus with other diabetic kidney complication: Secondary | ICD-10-CM | POA: Diagnosis not present

## 2016-07-05 DIAGNOSIS — N186 End stage renal disease: Secondary | ICD-10-CM | POA: Diagnosis not present

## 2016-07-05 DIAGNOSIS — N2581 Secondary hyperparathyroidism of renal origin: Secondary | ICD-10-CM | POA: Diagnosis not present

## 2016-07-05 DIAGNOSIS — D631 Anemia in chronic kidney disease: Secondary | ICD-10-CM | POA: Diagnosis not present

## 2016-07-05 DIAGNOSIS — D509 Iron deficiency anemia, unspecified: Secondary | ICD-10-CM | POA: Diagnosis not present

## 2016-07-07 DIAGNOSIS — D509 Iron deficiency anemia, unspecified: Secondary | ICD-10-CM | POA: Diagnosis not present

## 2016-07-07 DIAGNOSIS — N2581 Secondary hyperparathyroidism of renal origin: Secondary | ICD-10-CM | POA: Diagnosis not present

## 2016-07-07 DIAGNOSIS — E1129 Type 2 diabetes mellitus with other diabetic kidney complication: Secondary | ICD-10-CM | POA: Diagnosis not present

## 2016-07-07 DIAGNOSIS — D631 Anemia in chronic kidney disease: Secondary | ICD-10-CM | POA: Diagnosis not present

## 2016-07-07 DIAGNOSIS — N186 End stage renal disease: Secondary | ICD-10-CM | POA: Diagnosis not present

## 2016-07-10 DIAGNOSIS — N186 End stage renal disease: Secondary | ICD-10-CM | POA: Diagnosis not present

## 2016-07-10 DIAGNOSIS — E1129 Type 2 diabetes mellitus with other diabetic kidney complication: Secondary | ICD-10-CM | POA: Diagnosis not present

## 2016-07-10 DIAGNOSIS — N2581 Secondary hyperparathyroidism of renal origin: Secondary | ICD-10-CM | POA: Diagnosis not present

## 2016-07-10 DIAGNOSIS — D509 Iron deficiency anemia, unspecified: Secondary | ICD-10-CM | POA: Diagnosis not present

## 2016-07-10 DIAGNOSIS — D631 Anemia in chronic kidney disease: Secondary | ICD-10-CM | POA: Diagnosis not present

## 2016-07-11 ENCOUNTER — Encounter: Payer: Self-pay | Admitting: Vascular Surgery

## 2016-07-12 DIAGNOSIS — E1129 Type 2 diabetes mellitus with other diabetic kidney complication: Secondary | ICD-10-CM | POA: Diagnosis not present

## 2016-07-12 DIAGNOSIS — N2581 Secondary hyperparathyroidism of renal origin: Secondary | ICD-10-CM | POA: Diagnosis not present

## 2016-07-12 DIAGNOSIS — D631 Anemia in chronic kidney disease: Secondary | ICD-10-CM | POA: Diagnosis not present

## 2016-07-12 DIAGNOSIS — D509 Iron deficiency anemia, unspecified: Secondary | ICD-10-CM | POA: Diagnosis not present

## 2016-07-12 DIAGNOSIS — N186 End stage renal disease: Secondary | ICD-10-CM | POA: Diagnosis not present

## 2016-07-13 ENCOUNTER — Ambulatory Visit (INDEPENDENT_AMBULATORY_CARE_PROVIDER_SITE_OTHER): Payer: Medicare Other | Admitting: Vascular Surgery

## 2016-07-13 ENCOUNTER — Other Ambulatory Visit: Payer: Self-pay

## 2016-07-13 VITALS — BP 103/62 | HR 82 | Temp 99.0°F | Resp 32 | Ht 66.5 in | Wt >= 6400 oz

## 2016-07-13 DIAGNOSIS — N186 End stage renal disease: Secondary | ICD-10-CM | POA: Diagnosis not present

## 2016-07-13 DIAGNOSIS — Z992 Dependence on renal dialysis: Secondary | ICD-10-CM | POA: Diagnosis not present

## 2016-07-13 NOTE — Progress Notes (Signed)
Referring Physician: Dr Augustin Coupe  Patient name: [name] MRN: [id number] DOB: May 14, 1961 Sex: female  REASON FOR CONSULT: Decreased access flow  HPI: [name] is a 55 y.o. female,   sent for evaluation of a poorly performing left arm AV fistula. Her fistula was originally placed in 2014. She has had multiple interventions with angioplasty to try to improve flow in the left arm. She still has decreased flow in the left upper arm AV fistula. Other medical problems include congestive failure, end-stage renal disease requiring dialysis (Monday Wednesday Friday), diabetes, reflux hypertension all of which are been stable.  Past Medical History:  Diagnosis Date  . Chronic diastolic CHF (congestive heart failure) ([place])   . Chronic kidney disease (CKD), stage III (moderate)    [name] nephrology lov note 05-12-2013 on pt chart now on HD  . DCM (dilated cardiomyopathy) ([place])    nonischemic  . Depression   . DM type 2 (diabetes mellitus, type 2) ([facility])    diet controlledwith retinopathy and nephropathy  . GERD (gastroesophageal reflux disease)   . H/O cardiac arrest 10/2012  . Hard of hearing 10/2012   now wears 2 hearing aids  . History of hemodialysis dec 2013  . HTN (hypertension)   . Hydradenitis   . Hyperkalemia   . Hyperlipidemia   . Hypothyroidism   . Iron deficiency anemia   . Morbid obesity ([place])   . Multinodular goiter   . Pneumonia Nov 10, 2012  . Sleep apnea    no test yet per mother 10/22/13   . SOB (shortness of breath)    hx of   . Urinary tract infection    taking antibiotics for 3 days prior to surgery   Past Surgical History:  Procedure Laterality Date  . AV FISTULA PLACEMENT Left 07/25/2013   Procedure: ARTERIOVENOUS (AV) FISTULA CREATION ;  Surgeon: Rosetta Posner, MD;  Location: [facility];  Service: Vascular;  Laterality: Left;  . CYSTOSCOPY W/ RETROGRADES  11/16/2012   Procedure: CYSTOSCOPY WITH RETROGRADE PYELOGRAM;  Surgeon: Reece Packer, MD;   Location: [facility];  Service: Urology;  Laterality: Bilateral;  CYSTOSCOPY,BILATERAL RETROGRADE PYELOGRAM/ REMOVAL LEFT URETERAL STENT/ FULGERATION BLADDER MUCOSA/ INSERTION RIGHT URETERAL STENT  . CYSTOSCOPY W/ URETERAL STENT PLACEMENT  05/28/2012   Procedure: CYSTOSCOPY WITH RETROGRADE PYELOGRAM/URETERAL STENT PLACEMENT;  Surgeon: Reece Packer, MD;  Location: [facility];  Service: Urology;  Laterality: Left;  . CYSTOSCOPY WITH RETROGRADE PYELOGRAM, URETEROSCOPY AND STENT PLACEMENT Right 06/23/2013   Procedure: CYSTOSCOPY WITH RIGHT URETEROSCOPY, RIGHT  RETROGRADE PYELOGRAM, WITH LASER LIPOTRIPSY AND RIGHT URETERAL STENT EXCHANGE ;  Surgeon: Molli Hazard, MD;  Location: [facility];  Service: Urology;  Laterality: Right;  STENT EXCHANGE    . CYSTOSCOPY WITH RETROGRADE PYELOGRAM, URETEROSCOPY AND STENT PLACEMENT Right 11/06/2013   Procedure: CYSTOSCOPY WITH RETROGRADE PYELOGRAM, URETEROSCOPY STONE REMOVAL AND STENT REMOVAL;  Surgeon: Molli Hazard, MD;  Location: [facility];  Service: Urology;  Laterality: Right;  . HOLMIUM LASER APPLICATION N/A [id number]   Procedure: HOLMIUM LASER APPLICATION;  Surgeon: Molli Hazard, MD;  Location: [facility];  Service: Urology;  Laterality: N/A;  . INSERTION OF DIALYSIS CATHETER N/A 09/24/2013   Procedure: INSERTION OF DIALYSIS CATHETER;  Surgeon: Rosetta Posner, MD;  Location: [facility];  Service: Vascular;  Laterality: N/A;  . PORT-A-CATH REMOVAL    . PORTACATH PLACEMENT      Family History  Problem Relation Age of Onset  . Coronary artery disease Mother   .  Hypertension Mother   . Diabetes type II Mother   . Malignant hyperthermia Mother   . Coronary artery disease Father   . Hypertension Father   . Malignant hyperthermia Father   . Cancer Maternal Grandfather     ? Type  . Kidney failure Maternal Grandmother     SOCIAL HISTORY: Social History   Social History  . Marital status: Married    Spouse name: N/A  . Number of children: N/A  .  Years of education: N/A   Occupational History  . Not on file.   Social History Main Topics  . Smoking status: Former Smoker    Packs/day: 0.25    Years: 1.00    Types: Cigarettes    Quit date: 11/28/1979  . Smokeless tobacco: Never Used  . Alcohol use No  . Drug use: No  . Sexual activity: No   Other Topics Concern  . Not on file   Social History Narrative  . No narrative on file    Allergies  Allergen Reactions  . Amoxicillin-Pot Clavulanate Diarrhea  . Rosiglitazone Maleate Swelling    AVANDIA  . Amoxicillin Rash    Tolerated Zosyn 01/2013.  [name]    Current Outpatient Prescriptions  Medication Sig Dispense Refill  . aspirin EC 81 MG tablet Take 81 mg by mouth daily.    Marland Kitchen BYSTOLIC 2.5 MG tablet     . Calcium Acetate 667 MG TABS Take 3 tablets by mouth 3 (three) times daily with meals. 1 with each meal and 1 with snacks    . cephALEXin (KEFLEX) 500 MG capsule Take 1 capsule by mouth 3 (three) times daily.    . clindamycin (CLEOCIN T) 1 % external solution Apply 1 application topically 2 (two) times daily. TO THE AFFECTED AREAS    . cyclobenzaprine (FLEXERIL) 10 MG tablet Take 1 tablet (10 mg total) by mouth 3 (three) times daily as needed for muscle spasms. For Spasms 30 tablet 0  . DULoxetine (CYMBALTA) 60 MG capsule Take 1 capsule (60 mg total) by mouth daily. 30 capsule 0  . ethyl chloride spray Apply 2 application topically 3 (three) times a week. PRE-DIALYSIS  10  . famotidine (PEPCID) 20 MG tablet TAKE 1 TABLET BY MOUTH AT BEDTIME 30 tablet 0  . feeding supplement, RESOURCE BREEZE, (RESOURCE BREEZE) LIQD Take 1 Container by mouth 3 (three) times daily with meals.    . gabapentin (NEURONTIN) 100 MG capsule     . isosorbide mononitrate (IMDUR) 30 MG 24 hr tablet Take 30 mg by mouth every morning.    Marland Kitchen levothyroxine (SYNTHROID, LEVOTHROID) 75 MCG tablet Take 75 mcg by mouth daily.  0  . midodrine (PROAMATINE) 10 MG tablet     . multivitamin (RENA-VIT) TABS tablet Take  1 tablet by mouth daily.  6  . nebivolol (BYSTOLIC) 5 MG tablet Take 1 tablet (5 mg total) by mouth daily. 30 tablet 11  . oxyCODONE-acetaminophen (PERCOCET/ROXICET) 5-325 MG per tablet Take 1-2 tablets by mouth every 4 (four) hours as needed for pain. Patient has not taken since started dialysis 08/2013 due to causes hypotension    . pravastatin (PRAVACHOL) 40 MG tablet Take 40 mg by mouth daily. Patient takes in am    . Probiotic Product (PROBIOTIC DAILY PO) Take 1 tablet by mouth daily.     . SENSIPAR 60 MG tablet Take 60 mg by mouth daily. TAKE 120 MG BY MOUTH AT LARGEST MEAL OF THE DAY    . sodium hypochlorite (  DAKIN'S 1/4 STRENGTH) 0.125 % SOLN Irrigate with 1 application as directed 2 (two) times daily. Apply to wound with dressing changes    . sucroferric oxyhydroxide (VELPHORO) 500 MG chewable tablet Chew 500 mg by mouth 3 (three) times daily with meals.    . clonazePAM (KLONOPIN) 0.5 MG tablet Take 0.5 mg by mouth at bedtime.     . minocycline (MINOCIN,DYNACIN) 100 MG capsule Take 100 mg by mouth 2 (two) times daily.     No current facility-administered medications for this visit.     ROS:   General:  No weight loss, Fever, chills  HEENT: No recent headaches, no nasal bleeding, no visual changes, no sore throat  Neurologic: No dizziness, blackouts, seizures. No recent symptoms of stroke or mini- stroke. No recent episodes of slurred speech, or temporary blindness.  Cardiac: No recent episodes of chest pain/pressure, no shortness of breath at rest.  No shortness of breath with exertion.  Denies history of atrial fibrillation or irregular heartbeat  Vascular: No history of rest pain in feet.  No history of claudication.  No history of non-healing ulcer, No history of DVT   Pulmonary: No home oxygen, no productive cough, no hemoptysis,  No asthma or wheezing  Musculoskeletal:  [ ]  Arthritis, [ ]  Low back pain,  [ ]  Joint pain  Hematologic:No history of hypercoagulable state.  No  history of easy bleeding.  No history of anemia  Gastrointestinal: No hematochezia or melena,  No gastroesophageal reflux, no trouble swallowing  Urinary: [X]  chronic Kidney disease, [X]  on HD - [X]  MWF or [ ]  TTHS, [ ]  Burning with urination, [ ]  Frequent urination, [ ]  Difficulty urinating;   Skin: No rashes  Psychological: No history of anxiety,  No history of depression   Physical Examination  Vitals:   07/13/16 1100  BP: 103/62  Pulse: 82  Resp: (!) 32  Temp: 99 F (37.2 C)  TempSrc: Oral  SpO2: 92%  Weight: (!) 418 lb 14 oz (190 kg)  Height: 5' 6.5" (1.689 m)    Body mass index is 66.6 kg/m.  General:  Alert and oriented, no acute distress Pulmonary: Clear to auscultation bilaterally Cardiac: Regular Rate and Rhythm  Extremity Pulses:  2+ left radial pulse audible bruit left upper arm AV fistula Neurologic: Fairly symmetric numbness and tingling hands bilaterally  DATA:  I reviewed a relation shuntogram performed by Dr. Augustin Coupe. This showed diffuse narrowing over the first 3 cm of the vein perianastomotic.  ASSESSMENT:  Poorly functioning left arm AV fistula with diffuse proximal narrowing which has now failed angioplasty   PLAN:  Revision proximal left arm AV fistula most likely patch angioplasty over the first several centimeters. Risks benefits possible palpitations and procedure details were discussed the patient today including not limited to bleeding infection ischemic steal thrombosis of fistula. She understands and agrees to proceed. This is scheduled for Tuesday, August 29  Ruta Hinds, MD [facility] Office: [phone] Pager: [phone]

## 2016-07-14 DIAGNOSIS — N186 End stage renal disease: Secondary | ICD-10-CM | POA: Diagnosis not present

## 2016-07-14 DIAGNOSIS — D631 Anemia in chronic kidney disease: Secondary | ICD-10-CM | POA: Diagnosis not present

## 2016-07-14 DIAGNOSIS — D509 Iron deficiency anemia, unspecified: Secondary | ICD-10-CM | POA: Diagnosis not present

## 2016-07-14 DIAGNOSIS — N2581 Secondary hyperparathyroidism of renal origin: Secondary | ICD-10-CM | POA: Diagnosis not present

## 2016-07-14 DIAGNOSIS — E1129 Type 2 diabetes mellitus with other diabetic kidney complication: Secondary | ICD-10-CM | POA: Diagnosis not present

## 2016-07-17 DIAGNOSIS — D509 Iron deficiency anemia, unspecified: Secondary | ICD-10-CM | POA: Diagnosis not present

## 2016-07-17 DIAGNOSIS — N186 End stage renal disease: Secondary | ICD-10-CM | POA: Diagnosis not present

## 2016-07-17 DIAGNOSIS — N2581 Secondary hyperparathyroidism of renal origin: Secondary | ICD-10-CM | POA: Diagnosis not present

## 2016-07-17 DIAGNOSIS — D631 Anemia in chronic kidney disease: Secondary | ICD-10-CM | POA: Diagnosis not present

## 2016-07-17 DIAGNOSIS — E1129 Type 2 diabetes mellitus with other diabetic kidney complication: Secondary | ICD-10-CM | POA: Diagnosis not present

## 2016-07-19 DIAGNOSIS — D509 Iron deficiency anemia, unspecified: Secondary | ICD-10-CM | POA: Diagnosis not present

## 2016-07-19 DIAGNOSIS — E1129 Type 2 diabetes mellitus with other diabetic kidney complication: Secondary | ICD-10-CM | POA: Diagnosis not present

## 2016-07-19 DIAGNOSIS — N2581 Secondary hyperparathyroidism of renal origin: Secondary | ICD-10-CM | POA: Diagnosis not present

## 2016-07-19 DIAGNOSIS — N186 End stage renal disease: Secondary | ICD-10-CM | POA: Diagnosis not present

## 2016-07-19 DIAGNOSIS — D631 Anemia in chronic kidney disease: Secondary | ICD-10-CM | POA: Diagnosis not present

## 2016-07-21 DIAGNOSIS — D509 Iron deficiency anemia, unspecified: Secondary | ICD-10-CM | POA: Diagnosis not present

## 2016-07-21 DIAGNOSIS — N186 End stage renal disease: Secondary | ICD-10-CM | POA: Diagnosis not present

## 2016-07-21 DIAGNOSIS — N2581 Secondary hyperparathyroidism of renal origin: Secondary | ICD-10-CM | POA: Diagnosis not present

## 2016-07-21 DIAGNOSIS — D631 Anemia in chronic kidney disease: Secondary | ICD-10-CM | POA: Diagnosis not present

## 2016-07-21 DIAGNOSIS — E1129 Type 2 diabetes mellitus with other diabetic kidney complication: Secondary | ICD-10-CM | POA: Diagnosis not present

## 2016-07-24 ENCOUNTER — Encounter (HOSPITAL_COMMUNITY): Payer: Self-pay | Admitting: *Deleted

## 2016-07-24 DIAGNOSIS — N186 End stage renal disease: Secondary | ICD-10-CM | POA: Diagnosis not present

## 2016-07-24 DIAGNOSIS — E1129 Type 2 diabetes mellitus with other diabetic kidney complication: Secondary | ICD-10-CM | POA: Diagnosis not present

## 2016-07-24 DIAGNOSIS — D509 Iron deficiency anemia, unspecified: Secondary | ICD-10-CM | POA: Diagnosis not present

## 2016-07-24 DIAGNOSIS — N2581 Secondary hyperparathyroidism of renal origin: Secondary | ICD-10-CM | POA: Diagnosis not present

## 2016-07-24 DIAGNOSIS — D631 Anemia in chronic kidney disease: Secondary | ICD-10-CM | POA: Diagnosis not present

## 2016-07-24 MED ORDER — VANCOMYCIN HCL 10 G IV SOLR
1500.0000 mg | INTRAVENOUS | Status: AC
  Administered 2016-07-25: 1500 mg via INTRAVENOUS
  Filled 2016-07-24: qty 1500

## 2016-07-24 MED ORDER — SODIUM CHLORIDE 0.9 % IV SOLN
INTRAVENOUS | Status: DC
  Administered 2016-07-25: 09:00:00 via INTRAVENOUS

## 2016-07-24 NOTE — Progress Notes (Signed)
Pt SDW-Pre-op call completed by pt mother, Georgina Peer [facility]). Mother stated that pt PCP is Dr. Harlan Stains and cardiologist is Dr. Fransico Him. Mother stated that pt may have had an EKG within the last year done by PCP; records requested. Mother denies that pt had a sleep study but stated that pt wears 2 liters of oxygen at night. Please measure pt neck DOS to complete apnea screening assessment. Mother stated that pt takes Aspirin and Bystolic at night. Mother stated that pt does not check her blood glucose at home when reviewing diabetes protocol for interventions of BS<70. Mother made aware to have pt hold Glipizide night before surgery and morning of procedure. Mother denies having a history of malignant hyperthermia as previously documented in assessment. Please clarify if pt father has malignant hyperthermia as is also previously documented. Mother made aware to have pt stop otc vitamins, fish oil, herbal medications and NSAID's such as Ibuprofen, Motrin, Advil, Naproxen( Aleve ), BC and Goody Powder. Mother verbalized understanding of all pre-op instructions. Pt chart forwarded to anesthesia for review of history.

## 2016-07-24 NOTE — Anesthesia Preprocedure Evaluation (Addendum)
Anesthesia Evaluation  Patient identified by MRN, date of birth, ID band Patient awake    Reviewed: Allergy & Precautions, NPO status , Patient's Chart, lab work & pertinent test results  Airway Mallampati: III  TM Distance: >3 FB     Dental  (+) Edentulous Upper, Dental Advisory Given   Pulmonary shortness of breath and Long-Term Oxygen Therapy, sleep apnea , COPD, former smoker,   Coarse and audible BS   + decreased breath sounds      Cardiovascular hypertension, +CHF   Rhythm:Regular Rate:Normal     Neuro/Psych    GI/Hepatic GERD  ,  Endo/Other  diabetesMorbid obesity  Renal/GU CRFRenal disease     Musculoskeletal   Abdominal   Peds  Hematology  (+) anemia ,   Anesthesia Other Findings   Reproductive/Obstetrics                         Anesthesia Physical Anesthesia Plan  ASA: IV  Anesthesia Plan: General and MAC   Post-op Pain Management:    Induction: Intravenous  Airway Management Planned: Oral ETT and Video Laryngoscope Planned  Additional Equipment:   Intra-op Plan:   Post-operative Plan: Extubation in OR and Possible Post-op intubation/ventilation  Informed Consent: I have reviewed the patients History and Physical, chart, labs and discussed the procedure including the risks, benefits and alternatives for the proposed anesthesia with the patient or authorized representative who has indicated his/her understanding and acceptance.     Plan Discussed with:   Anesthesia Plan Comments: (SEE MY ANESTHESIA NOTE--Highlighted paragraph and last paragraph regarding anesthesia history. Myra Gianotti, PA-C  She is massively obese and and on home O2. There exists the real possibility of short term postop ventilation if in fact we proceed c Gen anesth. Plan Mac to start after discusssion c patient)     Anesthesia Quick Evaluation

## 2016-07-24 NOTE — Progress Notes (Addendum)
Anesthesia Chart Review: SAME DAY WORK-UP.  Patient is a 55 year old female scheduled for revision of left AVF on 07/25/16 by Dr. Oneida Alar.   History includes ESRD (HD MWF), DM2, chronic diastolic CHF, non-ischemic dilated cardiomyopathy '11, asystolic cardiac arrest (in the setting of acute renal failure, hyperkalemia, bilateral pneumonia with sepsis; s/p CPR, epinephrine) 11/11/12, HLD, former smoker, HTN, hypothyroidism, multinodular goiter, hydradenitis, GERD, hard of hearing (hearing aids), anemia, depression, super morbid obesity (last documented BMI 66.60; 418 lb/190kg. Increased risk for OSA, but I don't see a formal sleep study. She does use O2 2L at night.    There is no anesthesia documentation of family and patient complications with anesthesia (in review of his anesthesia records since 05/28/12). However, on 01/20/12 Doree Albee, RN (appears to be from ED encounter) checked on patient's family history that patient's mother and father both had a history of malignant hyperthermia. The father is decreased. Our PAT RN Sherlynn Stalls spoke with patient's mother today, and she denied history of malignant hyperthermia or other anesthesia complication other than dehydration after one surgery. She also reports that to her knowledge, patient's father (her ex-husband, who is now deceased) did not have a history of malignant hyperthermia either. PAT to inquire further with patient. I removed MH from the mother's history, since she was able to deny this history. Below is a summary of patient's last several surgeries and some of the anesthetics used:  - Cystoscopy: 05/28/12 (LMA; includes sevoflurane, propofol), 11/16/12 (ETT; includes desflurane, cisatracurium), 06/23/13 (ETT; includes desflurane, propofol, succinylcholine, cistracurium), 11/06/13 (ETT; includes desflurane, propofol, succinylcholine) - HD access: 07/25/13 (ETT; includes sevoflurane, propofol, succinylcholine), 09/24/13 (LMA; includes desflurane,  propofol)  - PCP is Dr. Harlan Stains. - Cardiologist is Dr. Fransico Him, last visit 09/09/15. She was having some hypotension with HD, but was otherwise doing okay from a cardiac standpoint. Bystolic was decreased. EF normalized in 2014. One year follow-up.  - She was seen by pulmonologist Dr. Melvyn Novas in the past, last visit 02/12/14. He wrote, "She does not appear to have a lung problem nor a respiratory issue that could not be solved by wt loss.Marland KitchenMarland KitchenShe has shown insight or interest in rehab to this point but will consider purchasing a treadmill for home use and I emphasized when she exercises it should always be while on 02 to help burn fat..Pulmonary f/u is prn."   05/09/13 Nuclear Stress Test [facility]): Impression: The post stress Lv is normal in size. Perfusion defect in the inferior and anterior myocardial region is consistent with diaphragmatic and breast attenuation. The remaining myocardium demonstrates normal myocardial perfusion with no evidence of ischemia or infarct. The post stress EF is 48%. Global left ventricular systolic function is mildly reduced. Unremarkable pharmacological stress test. Nondiagnostic ECG. Normal myocardial perfusion study. Normal myocardial perfusion scan demonstrating an attenuation artifact in the inferior anterior regions of the myocardium. No ischemia or infarct/scar seen in the remaining myocardium.  07/17/13 Echo: Study Conclusions - Left ventricle: The cavity size was moderately dilated. Wall thickness was increased in a pattern of mild LVH. Systolic function was normal. The estimated ejection fraction was in the range of 50% to 55%. Although no diagnostic regional wall motion abnormality was identified, this possibility cannot be completely excluded on the basis of this study. Features are consistent with a pseudonormal left ventricular filling pattern, with concomitant abnormal relaxation and increased filling pressure (grade 2  diastolic dysfunction). - Mitral valve: Mild regurgitation. - Left atrium: The atrium was mildly dilated. - Right  ventricle: The cavity size was mildly dilated. Wall thickness was normal. - Pulmonary arteries: Systolic pressure was mildly increased. PA peak pressure: 80mm Hg (S). Impressions: - Prior ECHO 2011 EF 35%. Improved.  Last EKG was > 1 year ago, so will need to be updated on arrival. She is for labs on arrival.   I reviewed above with anesthesiologist Dr. [place] Pink including highlighted paragraphs regarding ED family history documentation that both parents had history of MH. We questioned if this was an erroneous entry, as it would be unlikely for both parents to have [place]. Her mother is still living, and we were able to confirm with her today that she does NOT have a known history of MH. Mother is not aware of her ex-husband having either, but we are waiting to confirm this patient as well since her father is now deceased. Patient has had at least six surgeries with triggers since that 2013 ED entry. None of her actual anesthesia assessments/histories performed by PAT nurses, CRNAs, anesthesiologists, etc have ever shown a positive personal of family history of MH. Further evaluation and discussion of anesthesia history tomorrow by her anesthesia team, but at this point it is not anticipated that she will need a trigger free induction. (Update: Per PAT RN Sherlynn Stalls, patient denied any known personal or family member history of malignant hyperthermia.)  [facility]/Anesthesiology Phone [phone] 07/24/2016 3:41 PM

## 2016-07-25 ENCOUNTER — Encounter (HOSPITAL_COMMUNITY)
Admission: RE | Disposition: A | Discharge status: Home / Self Care | Payer: Self-pay | Source: Ambulatory Visit | Attending: Vascular Surgery

## 2016-07-25 ENCOUNTER — Ambulatory Visit (HOSPITAL_COMMUNITY): Payer: Medicare Other | Admitting: Vascular Surgery

## 2016-07-25 ENCOUNTER — Ambulatory Visit (HOSPITAL_COMMUNITY)
Admission: RE | Admit: 2016-07-25 | Discharge: 2016-07-25 | Disposition: A | Discharge status: Home / Self Care | Payer: Medicare Other | Source: Ambulatory Visit | Attending: Vascular Surgery | Admitting: Vascular Surgery

## 2016-07-25 ENCOUNTER — Encounter (HOSPITAL_COMMUNITY): Payer: Self-pay | Admitting: General Practice

## 2016-07-25 DIAGNOSIS — Z8674 Personal history of sudden cardiac arrest: Secondary | ICD-10-CM | POA: Diagnosis not present

## 2016-07-25 DIAGNOSIS — Z7982 Long term (current) use of aspirin: Secondary | ICD-10-CM | POA: Insufficient documentation

## 2016-07-25 DIAGNOSIS — I420 Dilated cardiomyopathy: Secondary | ICD-10-CM | POA: Diagnosis not present

## 2016-07-25 DIAGNOSIS — Z992 Dependence on renal dialysis: Secondary | ICD-10-CM | POA: Diagnosis not present

## 2016-07-25 DIAGNOSIS — F329 Major depressive disorder, single episode, unspecified: Secondary | ICD-10-CM | POA: Insufficient documentation

## 2016-07-25 DIAGNOSIS — Z7984 Long term (current) use of oral hypoglycemic drugs: Secondary | ICD-10-CM | POA: Diagnosis not present

## 2016-07-25 DIAGNOSIS — J449 Chronic obstructive pulmonary disease, unspecified: Secondary | ICD-10-CM | POA: Diagnosis not present

## 2016-07-25 DIAGNOSIS — Z87891 Personal history of nicotine dependence: Secondary | ICD-10-CM | POA: Insufficient documentation

## 2016-07-25 DIAGNOSIS — Z79899 Other long term (current) drug therapy: Secondary | ICD-10-CM | POA: Insufficient documentation

## 2016-07-25 DIAGNOSIS — N186 End stage renal disease: Secondary | ICD-10-CM | POA: Insufficient documentation

## 2016-07-25 DIAGNOSIS — T82858A Stenosis of vascular prosthetic devices, implants and grafts, initial encounter: Secondary | ICD-10-CM | POA: Insufficient documentation

## 2016-07-25 DIAGNOSIS — G4730 Sleep apnea, unspecified: Secondary | ICD-10-CM | POA: Insufficient documentation

## 2016-07-25 DIAGNOSIS — E1122 Type 2 diabetes mellitus with diabetic chronic kidney disease: Secondary | ICD-10-CM | POA: Insufficient documentation

## 2016-07-25 DIAGNOSIS — K219 Gastro-esophageal reflux disease without esophagitis: Secondary | ICD-10-CM | POA: Insufficient documentation

## 2016-07-25 DIAGNOSIS — T82590A Other mechanical complication of surgically created arteriovenous fistula, initial encounter: Secondary | ICD-10-CM | POA: Diagnosis not present

## 2016-07-25 DIAGNOSIS — E785 Hyperlipidemia, unspecified: Secondary | ICD-10-CM | POA: Diagnosis not present

## 2016-07-25 DIAGNOSIS — Z6841 Body Mass Index (BMI) 40.0 and over, adult: Secondary | ICD-10-CM | POA: Diagnosis not present

## 2016-07-25 DIAGNOSIS — Y832 Surgical operation with anastomosis, bypass or graft as the cause of abnormal reaction of the patient, or of later complication, without mention of misadventure at the time of the procedure: Secondary | ICD-10-CM | POA: Diagnosis not present

## 2016-07-25 DIAGNOSIS — E039 Hypothyroidism, unspecified: Secondary | ICD-10-CM | POA: Diagnosis not present

## 2016-07-25 DIAGNOSIS — I132 Hypertensive heart and chronic kidney disease with heart failure and with stage 5 chronic kidney disease, or end stage renal disease: Secondary | ICD-10-CM | POA: Insufficient documentation

## 2016-07-25 DIAGNOSIS — I509 Heart failure, unspecified: Secondary | ICD-10-CM | POA: Diagnosis not present

## 2016-07-25 DIAGNOSIS — E11319 Type 2 diabetes mellitus with unspecified diabetic retinopathy without macular edema: Secondary | ICD-10-CM | POA: Diagnosis not present

## 2016-07-25 DIAGNOSIS — D509 Iron deficiency anemia, unspecified: Secondary | ICD-10-CM | POA: Insufficient documentation

## 2016-07-25 DIAGNOSIS — Z792 Long term (current) use of antibiotics: Secondary | ICD-10-CM | POA: Insufficient documentation

## 2016-07-25 DIAGNOSIS — T82510A Breakdown (mechanical) of surgically created arteriovenous fistula, initial encounter: Secondary | ICD-10-CM | POA: Diagnosis present

## 2016-07-25 DIAGNOSIS — T82898A Other specified complication of vascular prosthetic devices, implants and grafts, initial encounter: Secondary | ICD-10-CM | POA: Diagnosis not present

## 2016-07-25 HISTORY — PX: REVISON OF ARTERIOVENOUS FISTULA: SHX6074

## 2016-07-25 LAB — POCT I-STAT 4, (NA,K, GLUC, HGB,HCT)
Glucose, Bld: 170 mg/dL — ABNORMAL HIGH (ref 65–99)
HEMATOCRIT: 31 % — AB (ref 36.0–46.0)
HEMOGLOBIN: 10.5 g/dL — AB (ref 12.0–15.0)
Potassium: 3.6 mmol/L (ref 3.5–5.1)
SODIUM: 140 mmol/L (ref 135–145)

## 2016-07-25 LAB — GLUCOSE, CAPILLARY: GLUCOSE-CAPILLARY: 114 mg/dL — AB (ref 65–99)

## 2016-07-25 SURGERY — REVISON OF ARTERIOVENOUS FISTULA
Anesthesia: Monitor Anesthesia Care | Site: Arm Upper | Laterality: Left

## 2016-07-25 MED ORDER — LIDOCAINE HCL (PF) 1 % IJ SOLN
INTRAMUSCULAR | Status: DC | PRN
  Administered 2016-07-25: 15 mL

## 2016-07-25 MED ORDER — FENTANYL CITRATE (PF) 100 MCG/2ML IJ SOLN
INTRAMUSCULAR | Status: DC | PRN
  Administered 2016-07-25: 25 ug via INTRAVENOUS
  Administered 2016-07-25 (×2): 50 ug via INTRAVENOUS
  Administered 2016-07-25: 25 ug via INTRAVENOUS

## 2016-07-25 MED ORDER — PHENYLEPHRINE 40 MCG/ML (10ML) SYRINGE FOR IV PUSH (FOR BLOOD PRESSURE SUPPORT)
PREFILLED_SYRINGE | INTRAVENOUS | Status: DC | PRN
  Administered 2016-07-25: 40 ug via INTRAVENOUS
  Administered 2016-07-25 (×2): 80 ug via INTRAVENOUS
  Administered 2016-07-25: 160 ug via INTRAVENOUS

## 2016-07-25 MED ORDER — CHLORHEXIDINE GLUCONATE CLOTH 2 % EX PADS: 6.0000 | MEDICATED_PAD | Freq: Once | CUTANEOUS | Status: DC

## 2016-07-25 MED ORDER — MIDAZOLAM HCL 2 MG/2ML IJ SOLN
INTRAMUSCULAR | Status: AC
  Filled 2016-07-25: qty 2

## 2016-07-25 MED ORDER — 0.9 % SODIUM CHLORIDE (POUR BTL) OPTIME
TOPICAL | Status: DC | PRN
  Administered 2016-07-25: 1000 mL

## 2016-07-25 MED ORDER — HEPARIN SODIUM (PORCINE) 1000 UNIT/ML IJ SOLN
INTRAMUSCULAR | Status: DC | PRN
  Administered 2016-07-25: 7000 [IU] via INTRAVENOUS

## 2016-07-25 MED ORDER — LIDOCAINE HCL (PF) 1 % IJ SOLN
INTRAMUSCULAR | Status: AC
  Filled 2016-07-25: qty 30

## 2016-07-25 MED ORDER — HEPARIN SODIUM (PORCINE) 5000 UNIT/ML IJ SOLN
INTRAMUSCULAR | Status: DC | PRN
  Administered 2016-07-25: 500 mL

## 2016-07-25 MED ORDER — FENTANYL CITRATE (PF) 250 MCG/5ML IJ SOLN
INTRAMUSCULAR | Status: AC
  Filled 2016-07-25: qty 5

## 2016-07-25 MED ORDER — OXYCODONE-ACETAMINOPHEN 5-325 MG PO TABS: 1.0000 | ORAL_TABLET | Freq: Four times a day (QID) | ORAL | 0 refills | Status: AC | PRN

## 2016-07-25 MED ORDER — MIDAZOLAM HCL 5 MG/5ML IJ SOLN
INTRAMUSCULAR | Status: DC | PRN
  Administered 2016-07-25: 1 mg via INTRAVENOUS

## 2016-07-25 MED ORDER — PROTAMINE SULFATE 10 MG/ML IV SOLN
INTRAVENOUS | Status: DC | PRN
  Administered 2016-07-25: 70 mg via INTRAVENOUS

## 2016-07-25 SURGICAL SUPPLY — 40 items
AGENT HMST SPONGE THK3/8 (HEMOSTASIS)
ARMBAND PINK RESTRICT EXTREMIT (MISCELLANEOUS) ×2 IMPLANT
CANISTER SUCTION 2500CC (MISCELLANEOUS) ×2 IMPLANT
CANNULA VESSEL 3MM 2 BLNT TIP (CANNULA) ×1 IMPLANT
CLIP TI MEDIUM 6 (CLIP) ×2 IMPLANT
CLIP TI WIDE RED SMALL 6 (CLIP) ×2 IMPLANT
COVER PROBE W GEL 5X96 (DRAPES) ×1 IMPLANT
DECANTER SPIKE VIAL GLASS SM (MISCELLANEOUS) ×2 IMPLANT
DRAIN PENROSE 1/4X12 LTX STRL (WOUND CARE) ×2 IMPLANT
ELECT REM PT RETURN 9FT ADLT (ELECTROSURGICAL) ×2
ELECTRODE REM PT RTRN 9FT ADLT (ELECTROSURGICAL) ×1 IMPLANT
GEL ULTRASOUND 20GR AQUASONIC (MISCELLANEOUS) IMPLANT
GLOVE BIO SURGEON STRL SZ 6.5 (GLOVE) ×1 IMPLANT
GLOVE BIO SURGEON STRL SZ7.5 (GLOVE) ×3 IMPLANT
GLOVE BIOGEL PI IND STRL 6.5 (GLOVE) IMPLANT
GLOVE BIOGEL PI IND STRL 7.0 (GLOVE) IMPLANT
GLOVE BIOGEL PI IND STRL 8 (GLOVE) IMPLANT
GLOVE BIOGEL PI INDICATOR 6.5 (GLOVE) ×2
GLOVE BIOGEL PI INDICATOR 7.0 (GLOVE) ×2
GLOVE BIOGEL PI INDICATOR 8 (GLOVE) ×1
GLOVE ECLIPSE 6.5 STRL STRAW (GLOVE) ×1 IMPLANT
GOWN STRL REUS W/ TWL LRG LVL3 (GOWN DISPOSABLE) ×3 IMPLANT
GOWN STRL REUS W/TWL LRG LVL3 (GOWN DISPOSABLE) ×8
HEMOSTAT SPONGE AVITENE ULTRA (HEMOSTASIS) IMPLANT
KIT BASIN OR (CUSTOM PROCEDURE TRAY) ×2 IMPLANT
KIT ROOM TURNOVER OR (KITS) ×2 IMPLANT
LIQUID BAND (GAUZE/BANDAGES/DRESSINGS) ×2 IMPLANT
LOOP VESSEL MINI RED (MISCELLANEOUS) IMPLANT
NS IRRIG 1000ML POUR BTL (IV SOLUTION) ×2 IMPLANT
PACK CV ACCESS (CUSTOM PROCEDURE TRAY) ×2 IMPLANT
PAD ARMBOARD 7.5X6 YLW CONV (MISCELLANEOUS) ×4 IMPLANT
PATCH VASC XENOSURE 1CMX6CM (Vascular Products) ×2 IMPLANT
PATCH VASC XENOSURE 1X6 (Vascular Products) IMPLANT
SUT PROLENE 6 0 CC (SUTURE) ×1 IMPLANT
SUT PROLENE 7 0 BV 1 (SUTURE) ×1 IMPLANT
SUT VIC AB 3-0 SH 27 (SUTURE) ×2
SUT VIC AB 3-0 SH 27X BRD (SUTURE) ×1 IMPLANT
SUT VICRYL 4-0 PS2 18IN ABS (SUTURE) ×2 IMPLANT
UNDERPAD 30X30 (UNDERPADS AND DIAPERS) ×2 IMPLANT
WATER STERILE IRR 1000ML POUR (IV SOLUTION) ×2 IMPLANT

## 2016-07-25 NOTE — Op Note (Signed)
Procedure: Revision Left Brachial Cephalic AV Fistula with bovine pericardial patch PreOp: Poorly functioning AVF PostOp: same Anesthesia: Local with MAC Asst: Silva Bandy, PA-c  Findings: 70% narrowing proximal fistula (3 cm)  with bovine pericardial patch  Operative details: After obtaining informed consent, the patient was taken to the operating room. The patient was placed in supine position on the operating table. After adequate sedation, the patient's entire left upper extremity was prepped and draped in the usual sterile fashion. A transverse incision was made through a pre-existing scar near the left antecubital area. The incision was carried onto the subcutaneous tissues down to level of the pre-existing AV fistula. The fistula was patent. Preoperative fistulogram had suggested there was a narrowing at proximal aspect of the fistula. The proximal 3 cm of the fistula was about 2.5 mm diameter and then 5-6 mm after this.  The flow through the fistula was slightly diminished in this area. Next the fistula was dissected free all the way down to level of the proximal anastomosis. The patient was given 7000 units of intravenous heparin. The fistula was clamped at the level of the anastomosis as well as just distal to the stenosed segment. A longitudinal opening was made in the fistula with a 11 blade. There was a thickened intimal hypoplasia over approximately 3 cm. A bovine pericardial patch was brought up on the operative field and sewn on as a patch angioplasty using a running 6-0 Prolene suture. Just prior to completion of the anastomosis, it was forebled backbled and thoroughly flushed.  The anastomosis was secured; clamps released; and good doppler flow in the fistula immediately.  Next subcutaneous tissues of each incision were reapproximated using running 3-0 Vicryl suture. The skin was closed with 4 0 Vicryl subcuticular stitch. The patient tolerated the procedure well and there were no  complications. Instrument sponge and needle counts were correct at the end of the case. The patient was taken to the recovery room in stable condition.  Management: If patient still has poor flow immediately will most likely need conversion to graft.  If flow diminishes after 4-6 weeks could have repeat fistulogram.  Ruta Hinds, MD [facility]: [phone] Pager: [phone]

## 2016-07-25 NOTE — Anesthesia Postprocedure Evaluation (Signed)
Anesthesia Post Note  Patient: Roop Marlar  Procedure(s) Performed: Procedure(s) (LRB): REVISON OF LEFT ARTERIOVENOUS FISTULA (Left)  Patient location during evaluation: PACU Anesthesia Type: MAC Level of consciousness: awake and alert Pain management: pain level controlled Vital Signs Assessment: post-procedure vital signs reviewed and stable Respiratory status: spontaneous breathing, nonlabored ventilation, respiratory function stable and patient connected to nasal cannula oxygen Cardiovascular status: stable and blood pressure returned to baseline Anesthetic complications: no    Last Vitals:  Vitals:   07/25/16 1252 07/25/16 1304  BP: (!) 92/45 (!) 97/47  Pulse: 75   Resp: (!) 29 20  Temp: 36.9 C     Last Pain:  Vitals:   07/25/16 0904  TempSrc: Oral                 Marvell Tamer,JAMES TERRILL

## 2016-07-25 NOTE — Interval H&P Note (Signed)
History and Physical Interval Note:  07/25/2016 8:33 AM  [name]  has presented today for surgery, with the diagnosis of Poorly functioning left arm arteriovenous fistula T82.590A; End Stage Renal Disease N18.6  The various methods of treatment have been discussed with the patient and family. After consideration of risks, benefits and other options for treatment, the patient has consented to  Procedure(s): REVISON OF ARTERIOVENOUS FISTULA (Left) as a surgical intervention .  The patient's history has been reviewed, patient examined, no change in status, stable for surgery.  I have reviewed the patient's chart and labs.  Questions were answered to the patient's satisfaction.     Ruta Hinds

## 2016-07-25 NOTE — H&P (View-Only) (Signed)
Referring Physician: Dr Augustin Coupe  Patient name: [name] MRN: [id number] DOB: 1961/01/04 Sex: female  REASON FOR CONSULT: Decreased access flow  HPI: [name] is a 55 y.o. female,   sent for evaluation of a poorly performing left arm AV fistula. Her fistula was originally placed in 2014. She has had multiple interventions with angioplasty to try to improve flow in the left arm. She still has decreased flow in the left upper arm AV fistula. Other medical problems include congestive failure, end-stage renal disease requiring dialysis (Monday Wednesday Friday), diabetes, reflux hypertension all of which are been stable.  Past Medical History:  Diagnosis Date  . Chronic diastolic CHF (congestive heart failure) ([place])   . Chronic kidney disease (CKD), stage III (moderate)    [name] nephrology lov note 05-12-2013 on pt chart now on HD  . DCM (dilated cardiomyopathy) ([place])    nonischemic  . Depression   . DM type 2 (diabetes mellitus, type 2) (H[facility])    diet controlledwith retinopathy and nephropathy  . GERD (gastroesophageal reflux disease)   . H/O cardiac arrest 10/2012  . Hard of hearing 10/2012   now wears 2 hearing aids  . History of hemodialysis dec 2013  . HTN (hypertension)   . Hydradenitis   . Hyperkalemia   . Hyperlipidemia   . Hypothyroidism   . Iron deficiency anemia   . Morbid obesity ([place])   . Multinodular goiter   . Pneumonia Nov 10, 2012  . Sleep apnea    no test yet per mother 10/22/13   . SOB (shortness of breath)    hx of   . Urinary tract infection    taking antibiotics for 3 days prior to surgery   Past Surgical History:  Procedure Laterality Date  . AV FISTULA PLACEMENT Left 07/25/2013   Procedure: ARTERIOVENOUS (AV) FISTULA CREATION ;  Surgeon: Rosetta Posner, MD;  Location: [facility];  Service: Vascular;  Laterality: Left;  . CYSTOSCOPY W/ RETROGRADES  11/16/2012   Procedure: CYSTOSCOPY WITH RETROGRADE PYELOGRAM;  Surgeon: Reece Packer, MD;   [facility];  Service: Urology;  Laterality: Bilateral;  CYSTOSCOPY,BILATERAL RETROGRADE PYELOGRAM/ REMOVAL LEFT URETERAL STENT/ FULGERATION BLADDER MUCOSA/ INSERTION RIGHT URETERAL STENT  . CYSTOSCOPY W/ URETERAL STENT PLACEMENT  05/28/2012   Procedure: CYSTOSCOPY WITH RETROGRADE PYELOGRAM/URETERAL STENT PLACEMENT;  Surgeon: Reece Packer, MD;  [facility];  Service: Urology;  Laterality: Left;  . CYSTOSCOPY WITH RETROGRADE PYELOGRAM, URETEROSCOPY AND STENT PLACEMENT Right 06/23/2013   Procedure: CYSTOSCOPY WITH RIGHT URETEROSCOPY, RIGHT  RETROGRADE PYELOGRAM, WITH LASER LIPOTRIPSY AND RIGHT URETERAL STENT EXCHANGE ;  Surgeon: Molli Hazard, MD;  [facility];  Service: Urology;  Laterality: Right;  STENT EXCHANGE    . CYSTOSCOPY WITH RETROGRADE PYELOGRAM, URETEROSCOPY AND STENT PLACEMENT Right 11/06/2013   Procedure: CYSTOSCOPY WITH RETROGRADE PYELOGRAM, URETEROSCOPY STONE REMOVAL AND STENT REMOVAL;  Surgeon: Molli Hazard, MD;  [facility];  Service: Urology;  Laterality: Right;  . HOLMIUM LASER APPLICATION N/A [id number]   Procedure: HOLMIUM LASER APPLICATION;  Surgeon: Molli Hazard, MD;  [facility];  Service: Urology;  Laterality: N/A;  . INSERTION OF DIALYSIS CATHETER N/A 09/24/2013   Procedure: INSERTION OF DIALYSIS CATHETER;  Surgeon: Rosetta Posner, MD;  Location: [facility];  Service: Vascular;  Laterality: N/A;  . PORT-A-CATH REMOVAL    . PORTACATH PLACEMENT      Family History  Problem Relation Age of Onset  . Coronary artery disease Mother   .  Hypertension Mother   . Diabetes type II Mother   . Malignant hyperthermia Mother   . Coronary artery disease Father   . Hypertension Father   . Malignant hyperthermia Father   . Cancer Maternal Grandfather     ? Type  . Kidney failure Maternal Grandmother     SOCIAL HISTORY: Social History   Social History  . Marital status: Married    Spouse name: N/A  . Number of children: N/A  .  Years of education: N/A   Occupational History  . Not on file.   Social History Main Topics  . Smoking status: Former Smoker    Packs/day: 0.25    Years: 1.00    Types: Cigarettes    Quit date: 11/28/1979  . Smokeless tobacco: Never Used  . Alcohol use No  . Drug use: No  . Sexual activity: No   Other Topics Concern  . Not on file   Social History Narrative  . No narrative on file    Allergies  Allergen Reactions  . Amoxicillin-Pot Clavulanate Diarrhea  . Rosiglitazone Maleate Swelling    AVANDIA  . Amoxicillin Rash    Tolerated Zosyn 01/2013.  [name]    Current Outpatient Prescriptions  Medication Sig Dispense Refill  . aspirin EC 81 MG tablet Take 81 mg by mouth daily.    Marland Kitchen BYSTOLIC 2.5 MG tablet     . Calcium Acetate 667 MG TABS Take 3 tablets by mouth 3 (three) times daily with meals. 1 with each meal and 1 with snacks    . cephALEXin (KEFLEX) 500 MG capsule Take 1 capsule by mouth 3 (three) times daily.    . clindamycin (CLEOCIN T) 1 % external solution Apply 1 application topically 2 (two) times daily. TO THE AFFECTED AREAS    . cyclobenzaprine (FLEXERIL) 10 MG tablet Take 1 tablet (10 mg total) by mouth 3 (three) times daily as needed for muscle spasms. For Spasms 30 tablet 0  . DULoxetine (CYMBALTA) 60 MG capsule Take 1 capsule (60 mg total) by mouth daily. 30 capsule 0  . ethyl chloride spray Apply 2 application topically 3 (three) times a week. PRE-DIALYSIS  10  . famotidine (PEPCID) 20 MG tablet TAKE 1 TABLET BY MOUTH AT BEDTIME 30 tablet 0  . feeding supplement, RESOURCE BREEZE, (RESOURCE BREEZE) LIQD Take 1 Container by mouth 3 (three) times daily with meals.    . gabapentin (NEURONTIN) 100 MG capsule     . isosorbide mononitrate (IMDUR) 30 MG 24 hr tablet Take 30 mg by mouth every morning.    Marland Kitchen levothyroxine (SYNTHROID, LEVOTHROID) 75 MCG tablet Take 75 mcg by mouth daily.  0  . midodrine (PROAMATINE) 10 MG tablet     . multivitamin (RENA-VIT) TABS tablet Take  1 tablet by mouth daily.  6  . nebivolol (BYSTOLIC) 5 MG tablet Take 1 tablet (5 mg total) by mouth daily. 30 tablet 11  . oxyCODONE-acetaminophen (PERCOCET/ROXICET) 5-325 MG per tablet Take 1-2 tablets by mouth every 4 (four) hours as needed for pain. Patient has not taken since started dialysis 08/2013 due to causes hypotension    . pravastatin (PRAVACHOL) 40 MG tablet Take 40 mg by mouth daily. Patient takes in am    . Probiotic Product (PROBIOTIC DAILY PO) Take 1 tablet by mouth daily.     . SENSIPAR 60 MG tablet Take 60 mg by mouth daily. TAKE 120 MG BY MOUTH AT LARGEST MEAL OF THE DAY    . sodium hypochlorite (  DAKIN'S 1/4 STRENGTH) 0.125 % SOLN Irrigate with 1 application as directed 2 (two) times daily. Apply to wound with dressing changes    . sucroferric oxyhydroxide (VELPHORO) 500 MG chewable tablet Chew 500 mg by mouth 3 (three) times daily with meals.    . clonazePAM (KLONOPIN) 0.5 MG tablet Take 0.5 mg by mouth at bedtime.     . minocycline (MINOCIN,DYNACIN) 100 MG capsule Take 100 mg by mouth 2 (two) times daily.     No current facility-administered medications for this visit.     ROS:   General:  No weight loss, Fever, chills  HEENT: No recent headaches, no nasal bleeding, no visual changes, no sore throat  Neurologic: No dizziness, blackouts, seizures. No recent symptoms of stroke or mini- stroke. No recent episodes of slurred speech, or temporary blindness.  Cardiac: No recent episodes of chest pain/pressure, no shortness of breath at rest.  No shortness of breath with exertion.  Denies history of atrial fibrillation or irregular heartbeat  Vascular: No history of rest pain in feet.  No history of claudication.  No history of non-healing ulcer, No history of DVT   Pulmonary: No home oxygen, no productive cough, no hemoptysis,  No asthma or wheezing  Musculoskeletal:  [ ]  Arthritis, [ ]  Low back pain,  [ ]  Joint pain  Hematologic:No history of hypercoagulable state.  No  history of easy bleeding.  No history of anemia  Gastrointestinal: No hematochezia or melena,  No gastroesophageal reflux, no trouble swallowing  Urinary: [X]  chronic Kidney disease, [X]  on HD - [X]  MWF or [ ]  TTHS, [ ]  Burning with urination, [ ]  Frequent urination, [ ]  Difficulty urinating;   Skin: No rashes  Psychological: No history of anxiety,  No history of depression   Physical Examination  Vitals:   07/13/16 1100  BP: 103/62  Pulse: 82  Resp: (!) 32  Temp: 99 F (37.2 C)  TempSrc: Oral  SpO2: 92%  Weight: (!) 418 lb 14 oz (190 kg)  Height: 5' 6.5" (1.689 m)    Body mass index is 66.6 kg/m.  General:  Alert and oriented, no acute distress Pulmonary: Clear to auscultation bilaterally Cardiac: Regular Rate and Rhythm  Extremity Pulses:  2+ left radial pulse audible bruit left upper arm AV fistula Neurologic: Fairly symmetric numbness and tingling hands bilaterally  DATA:  I reviewed a relation shuntogram performed by Dr. Augustin Coupe. This showed diffuse narrowing over the first 3 cm of the vein perianastomotic.  ASSESSMENT:  Poorly functioning left arm AV fistula with diffuse proximal narrowing which has now failed angioplasty   PLAN:  Revision proximal left arm AV fistula most likely patch angioplasty over the first several centimeters. Risks benefits possible palpitations and procedure details were discussed the patient today including not limited to bleeding infection ischemic steal thrombosis of fistula. She understands and agrees to proceed. This is scheduled for Tuesday, August 29  Ruta Hinds, MD [facility] Office: [phone] Pager: [phone]

## 2016-07-25 NOTE — Progress Notes (Signed)
   07/25/16 0858  OBSTRUCTIVE SLEEP APNEA  Have you ever been diagnosed with sleep apnea through a sleep study? No  Do you snore loudly (loud enough to be heard through closed doors)?  1  Do you often feel tired, fatigued, or sleepy during the daytime (such as falling asleep during driving or talking to someone)? 0  Has anyone observed you stop breathing during your sleep? 0  Do you have, or are you being treated for high blood pressure? 1  BMI more than 35 kg/m2? 1  Age > 50 (1-yes) 1  Neck circumference greater than:Female 16 inches or larger, Female 17inches or larger? 1  Female Gender (Yes=1) 0  Obstructive Sleep Apnea Score 5

## 2016-07-25 NOTE — Transfer of Care (Signed)
Immediate Anesthesia Transfer of Care Note  Patient: [name]  Procedure(s) Performed: Procedure(s): REVISON OF LEFT ARTERIOVENOUS FISTULA (Left)  Patient Location: PACU  Anesthesia Type:MAC  Level of Consciousness: awake, alert , oriented and patient cooperative  Airway & Oxygen Therapy: Patient Spontanous Breathing and Patient connected to nasal cannula oxygen  Post-op Assessment: Report given to RN and Post -op Vital signs reviewed and stable  Post vital signs: Reviewed and stable  Last Vitals:  Vitals:   07/25/16 0904 07/25/16 1221  BP: (!) 125/50   Pulse: 81   Resp: 20 (!) (P) 9  Temp: 37.2 C     Last Pain:  Vitals:   07/25/16 0904  TempSrc: Oral      Patients Stated Pain Goal: 2 ([id number])  Complications: No apparent anesthesia complications

## 2016-07-26 ENCOUNTER — Encounter (HOSPITAL_COMMUNITY): Payer: Self-pay | Admitting: Vascular Surgery

## 2016-07-26 DIAGNOSIS — N2581 Secondary hyperparathyroidism of renal origin: Secondary | ICD-10-CM | POA: Diagnosis not present

## 2016-07-26 DIAGNOSIS — E1129 Type 2 diabetes mellitus with other diabetic kidney complication: Secondary | ICD-10-CM | POA: Diagnosis not present

## 2016-07-26 DIAGNOSIS — D631 Anemia in chronic kidney disease: Secondary | ICD-10-CM | POA: Diagnosis not present

## 2016-07-26 DIAGNOSIS — D509 Iron deficiency anemia, unspecified: Secondary | ICD-10-CM | POA: Diagnosis not present

## 2016-07-26 DIAGNOSIS — N186 End stage renal disease: Secondary | ICD-10-CM | POA: Diagnosis not present

## 2016-07-27 DIAGNOSIS — E1129 Type 2 diabetes mellitus with other diabetic kidney complication: Secondary | ICD-10-CM | POA: Diagnosis not present

## 2016-07-27 DIAGNOSIS — Z992 Dependence on renal dialysis: Secondary | ICD-10-CM | POA: Diagnosis not present

## 2016-07-27 DIAGNOSIS — N186 End stage renal disease: Secondary | ICD-10-CM | POA: Diagnosis not present

## 2016-07-28 DIAGNOSIS — E1129 Type 2 diabetes mellitus with other diabetic kidney complication: Secondary | ICD-10-CM | POA: Diagnosis not present

## 2016-07-28 DIAGNOSIS — N186 End stage renal disease: Secondary | ICD-10-CM | POA: Diagnosis not present

## 2016-07-28 DIAGNOSIS — N2581 Secondary hyperparathyroidism of renal origin: Secondary | ICD-10-CM | POA: Diagnosis not present

## 2016-07-28 DIAGNOSIS — Z23 Encounter for immunization: Secondary | ICD-10-CM | POA: Diagnosis not present

## 2016-07-28 DIAGNOSIS — D509 Iron deficiency anemia, unspecified: Secondary | ICD-10-CM | POA: Diagnosis not present

## 2016-07-28 DIAGNOSIS — D631 Anemia in chronic kidney disease: Secondary | ICD-10-CM | POA: Diagnosis not present

## 2016-07-31 DIAGNOSIS — N2581 Secondary hyperparathyroidism of renal origin: Secondary | ICD-10-CM | POA: Diagnosis not present

## 2016-07-31 DIAGNOSIS — Z23 Encounter for immunization: Secondary | ICD-10-CM | POA: Diagnosis not present

## 2016-07-31 DIAGNOSIS — D631 Anemia in chronic kidney disease: Secondary | ICD-10-CM | POA: Diagnosis not present

## 2016-07-31 DIAGNOSIS — D509 Iron deficiency anemia, unspecified: Secondary | ICD-10-CM | POA: Diagnosis not present

## 2016-07-31 DIAGNOSIS — N186 End stage renal disease: Secondary | ICD-10-CM | POA: Diagnosis not present

## 2016-07-31 DIAGNOSIS — E1129 Type 2 diabetes mellitus with other diabetic kidney complication: Secondary | ICD-10-CM | POA: Diagnosis not present

## 2016-08-02 DIAGNOSIS — D631 Anemia in chronic kidney disease: Secondary | ICD-10-CM | POA: Diagnosis not present

## 2016-08-02 DIAGNOSIS — D509 Iron deficiency anemia, unspecified: Secondary | ICD-10-CM | POA: Diagnosis not present

## 2016-08-02 DIAGNOSIS — E1129 Type 2 diabetes mellitus with other diabetic kidney complication: Secondary | ICD-10-CM | POA: Diagnosis not present

## 2016-08-02 DIAGNOSIS — N186 End stage renal disease: Secondary | ICD-10-CM | POA: Diagnosis not present

## 2016-08-02 DIAGNOSIS — Z23 Encounter for immunization: Secondary | ICD-10-CM | POA: Diagnosis not present

## 2016-08-02 DIAGNOSIS — N2581 Secondary hyperparathyroidism of renal origin: Secondary | ICD-10-CM | POA: Diagnosis not present

## 2016-08-04 DIAGNOSIS — D509 Iron deficiency anemia, unspecified: Secondary | ICD-10-CM | POA: Diagnosis not present

## 2016-08-04 DIAGNOSIS — N2581 Secondary hyperparathyroidism of renal origin: Secondary | ICD-10-CM | POA: Diagnosis not present

## 2016-08-04 DIAGNOSIS — E1129 Type 2 diabetes mellitus with other diabetic kidney complication: Secondary | ICD-10-CM | POA: Diagnosis not present

## 2016-08-04 DIAGNOSIS — D631 Anemia in chronic kidney disease: Secondary | ICD-10-CM | POA: Diagnosis not present

## 2016-08-04 DIAGNOSIS — Z23 Encounter for immunization: Secondary | ICD-10-CM | POA: Diagnosis not present

## 2016-08-04 DIAGNOSIS — N186 End stage renal disease: Secondary | ICD-10-CM | POA: Diagnosis not present

## 2016-08-07 DIAGNOSIS — Z23 Encounter for immunization: Secondary | ICD-10-CM | POA: Diagnosis not present

## 2016-08-07 DIAGNOSIS — D631 Anemia in chronic kidney disease: Secondary | ICD-10-CM | POA: Diagnosis not present

## 2016-08-07 DIAGNOSIS — N2581 Secondary hyperparathyroidism of renal origin: Secondary | ICD-10-CM | POA: Diagnosis not present

## 2016-08-07 DIAGNOSIS — E1129 Type 2 diabetes mellitus with other diabetic kidney complication: Secondary | ICD-10-CM | POA: Diagnosis not present

## 2016-08-07 DIAGNOSIS — D509 Iron deficiency anemia, unspecified: Secondary | ICD-10-CM | POA: Diagnosis not present

## 2016-08-07 DIAGNOSIS — N186 End stage renal disease: Secondary | ICD-10-CM | POA: Diagnosis not present

## 2016-08-09 DIAGNOSIS — N2581 Secondary hyperparathyroidism of renal origin: Secondary | ICD-10-CM | POA: Diagnosis not present

## 2016-08-09 DIAGNOSIS — E1129 Type 2 diabetes mellitus with other diabetic kidney complication: Secondary | ICD-10-CM | POA: Diagnosis not present

## 2016-08-09 DIAGNOSIS — Z23 Encounter for immunization: Secondary | ICD-10-CM | POA: Diagnosis not present

## 2016-08-09 DIAGNOSIS — D631 Anemia in chronic kidney disease: Secondary | ICD-10-CM | POA: Diagnosis not present

## 2016-08-09 DIAGNOSIS — D509 Iron deficiency anemia, unspecified: Secondary | ICD-10-CM | POA: Diagnosis not present

## 2016-08-09 DIAGNOSIS — N186 End stage renal disease: Secondary | ICD-10-CM | POA: Diagnosis not present

## 2016-08-11 DIAGNOSIS — D509 Iron deficiency anemia, unspecified: Secondary | ICD-10-CM | POA: Diagnosis not present

## 2016-08-11 DIAGNOSIS — D631 Anemia in chronic kidney disease: Secondary | ICD-10-CM | POA: Diagnosis not present

## 2016-08-11 DIAGNOSIS — E1129 Type 2 diabetes mellitus with other diabetic kidney complication: Secondary | ICD-10-CM | POA: Diagnosis not present

## 2016-08-11 DIAGNOSIS — Z23 Encounter for immunization: Secondary | ICD-10-CM | POA: Diagnosis not present

## 2016-08-11 DIAGNOSIS — N186 End stage renal disease: Secondary | ICD-10-CM | POA: Diagnosis not present

## 2016-08-11 DIAGNOSIS — N2581 Secondary hyperparathyroidism of renal origin: Secondary | ICD-10-CM | POA: Diagnosis not present

## 2016-08-14 DIAGNOSIS — D509 Iron deficiency anemia, unspecified: Secondary | ICD-10-CM | POA: Diagnosis not present

## 2016-08-14 DIAGNOSIS — E1129 Type 2 diabetes mellitus with other diabetic kidney complication: Secondary | ICD-10-CM | POA: Diagnosis not present

## 2016-08-14 DIAGNOSIS — D631 Anemia in chronic kidney disease: Secondary | ICD-10-CM | POA: Diagnosis not present

## 2016-08-14 DIAGNOSIS — N186 End stage renal disease: Secondary | ICD-10-CM | POA: Diagnosis not present

## 2016-08-14 DIAGNOSIS — N2581 Secondary hyperparathyroidism of renal origin: Secondary | ICD-10-CM | POA: Diagnosis not present

## 2016-08-14 DIAGNOSIS — Z23 Encounter for immunization: Secondary | ICD-10-CM | POA: Diagnosis not present

## 2016-08-16 DIAGNOSIS — D509 Iron deficiency anemia, unspecified: Secondary | ICD-10-CM | POA: Diagnosis not present

## 2016-08-16 DIAGNOSIS — E1129 Type 2 diabetes mellitus with other diabetic kidney complication: Secondary | ICD-10-CM | POA: Diagnosis not present

## 2016-08-16 DIAGNOSIS — Z23 Encounter for immunization: Secondary | ICD-10-CM | POA: Diagnosis not present

## 2016-08-16 DIAGNOSIS — N2581 Secondary hyperparathyroidism of renal origin: Secondary | ICD-10-CM | POA: Diagnosis not present

## 2016-08-16 DIAGNOSIS — D631 Anemia in chronic kidney disease: Secondary | ICD-10-CM | POA: Diagnosis not present

## 2016-08-16 DIAGNOSIS — N186 End stage renal disease: Secondary | ICD-10-CM | POA: Diagnosis not present

## 2016-08-18 DIAGNOSIS — E1129 Type 2 diabetes mellitus with other diabetic kidney complication: Secondary | ICD-10-CM | POA: Diagnosis not present

## 2016-08-18 DIAGNOSIS — D631 Anemia in chronic kidney disease: Secondary | ICD-10-CM | POA: Diagnosis not present

## 2016-08-18 DIAGNOSIS — N2581 Secondary hyperparathyroidism of renal origin: Secondary | ICD-10-CM | POA: Diagnosis not present

## 2016-08-18 DIAGNOSIS — D509 Iron deficiency anemia, unspecified: Secondary | ICD-10-CM | POA: Diagnosis not present

## 2016-08-18 DIAGNOSIS — Z23 Encounter for immunization: Secondary | ICD-10-CM | POA: Diagnosis not present

## 2016-08-18 DIAGNOSIS — N186 End stage renal disease: Secondary | ICD-10-CM | POA: Diagnosis not present

## 2016-08-21 ENCOUNTER — Encounter (HOSPITAL_COMMUNITY): Payer: BC Managed Care – PPO

## 2016-08-21 DIAGNOSIS — N2581 Secondary hyperparathyroidism of renal origin: Secondary | ICD-10-CM | POA: Diagnosis not present

## 2016-08-21 DIAGNOSIS — N186 End stage renal disease: Secondary | ICD-10-CM | POA: Diagnosis not present

## 2016-08-21 DIAGNOSIS — D509 Iron deficiency anemia, unspecified: Secondary | ICD-10-CM | POA: Diagnosis not present

## 2016-08-21 DIAGNOSIS — D631 Anemia in chronic kidney disease: Secondary | ICD-10-CM | POA: Diagnosis not present

## 2016-08-21 DIAGNOSIS — E1129 Type 2 diabetes mellitus with other diabetic kidney complication: Secondary | ICD-10-CM | POA: Diagnosis not present

## 2016-08-21 DIAGNOSIS — Z23 Encounter for immunization: Secondary | ICD-10-CM | POA: Diagnosis not present

## 2016-08-22 ENCOUNTER — Ambulatory Visit (HOSPITAL_COMMUNITY)
Admission: RE | Admit: 2016-08-22 | Discharge: 2016-08-22 | Disposition: A | Discharge status: Home / Self Care | Payer: Medicare Other | Source: Ambulatory Visit | Attending: Family Medicine | Admitting: Family Medicine

## 2016-08-22 DIAGNOSIS — D649 Anemia, unspecified: Secondary | ICD-10-CM | POA: Insufficient documentation

## 2016-08-22 LAB — PREPARE RBC (CROSSMATCH)

## 2016-08-22 MED ORDER — ACETAMINOPHEN 325 MG PO TABS
650.0000 mg | ORAL_TABLET | Freq: Once | ORAL | Status: AC
  Administered 2016-08-22: 650 mg via ORAL
  Filled 2016-08-22: qty 2

## 2016-08-22 MED ORDER — SODIUM CHLORIDE 0.9 % IV SOLN
Freq: Once | INTRAVENOUS | Status: AC
  Administered 2016-08-22: 12:00:00 via INTRAVENOUS

## 2016-08-22 NOTE — Discharge Instructions (Signed)
Blood Transfusion, Care After  These instructions give you information about caring for yourself after your procedure. Your doctor may also give you more specific instructions. Call your doctor if you have any problems or questions after your procedure.   HOME CARE   Take medicines only as told by your doctor. Ask your doctor if you can take an over-the-counter pain reliever if you have a fever or headache a day or two after your procedure.   Return to your normal activities as told by your doctor.  GET HELP IF:    You develop redness or irritation at your IV site.   You have a fever, chills, or a headache that does not go away.   Your pee (urine) is darker than normal.   Your urine turns:    Pink.    Red.    Brown.   The white part of your eye turns yellow (jaundice).   You feel weak after doing your normal activities.  GET HELP RIGHT AWAY IF:    You have trouble breathing.   You have fever and chills and you also have:    Anxiety.    Chest or back pain.    Flushed or pink skin.    Clammy or sweaty skin.    A fast heartbeat.    A sick feeling in your stomach (nausea).     This information is not intended to replace advice given to you by your health care provider. Make sure you discuss any questions you have with your health care provider.     Document Released: 12/04/2014 Document Reviewed: 12/04/2014  Elsevier Interactive Patient Education 2016 [employer] Inc[employer]

## 2016-08-22 NOTE — Progress Notes (Signed)
Patient ID: [name], female   DOB: [birth year], 55 y.o.   MRN: [id number] Provider: Mauricia Area MD  Associated Diagnosis: Low hgb., 7.0   Procedure: Transfusion of 2 units PRBC's via PIV  Patient tolerated procedure well. Went over discharge instructions and copy given to patient. Alert, oriented and ambulatory to wheelchair at time of discharge. Discharged to home with mother.

## 2016-08-23 DIAGNOSIS — N2581 Secondary hyperparathyroidism of renal origin: Secondary | ICD-10-CM | POA: Diagnosis not present

## 2016-08-23 DIAGNOSIS — Z23 Encounter for immunization: Secondary | ICD-10-CM | POA: Diagnosis not present

## 2016-08-23 DIAGNOSIS — N186 End stage renal disease: Secondary | ICD-10-CM | POA: Diagnosis not present

## 2016-08-23 DIAGNOSIS — E1129 Type 2 diabetes mellitus with other diabetic kidney complication: Secondary | ICD-10-CM | POA: Diagnosis not present

## 2016-08-23 DIAGNOSIS — D509 Iron deficiency anemia, unspecified: Secondary | ICD-10-CM | POA: Diagnosis not present

## 2016-08-23 DIAGNOSIS — D631 Anemia in chronic kidney disease: Secondary | ICD-10-CM | POA: Diagnosis not present

## 2016-08-23 LAB — TYPE AND SCREEN
ABO/RH(D): B POS
Antibody Screen: NEGATIVE
Unit division: 0
Unit division: 0

## 2016-08-24 DIAGNOSIS — H6123 Impacted cerumen, bilateral: Secondary | ICD-10-CM | POA: Diagnosis not present

## 2016-08-24 DIAGNOSIS — H903 Sensorineural hearing loss, bilateral: Secondary | ICD-10-CM | POA: Diagnosis not present

## 2016-08-25 ENCOUNTER — Emergency Department (HOSPITAL_COMMUNITY): Payer: Medicare Other

## 2016-08-25 ENCOUNTER — Inpatient Hospital Stay (HOSPITAL_COMMUNITY)
Admission: EM | Admit: 2016-08-25 | Discharge: 2016-08-31 | DRG: 871 | Disposition: A | Discharge status: Home Health | Payer: Medicare Other | Attending: Internal Medicine | Admitting: Internal Medicine

## 2016-08-25 ENCOUNTER — Encounter (HOSPITAL_COMMUNITY): Payer: Self-pay | Admitting: Emergency Medicine

## 2016-08-25 DIAGNOSIS — R7881 Bacteremia: Secondary | ICD-10-CM | POA: Diagnosis not present

## 2016-08-25 DIAGNOSIS — I5032 Chronic diastolic (congestive) heart failure: Secondary | ICD-10-CM | POA: Diagnosis present

## 2016-08-25 DIAGNOSIS — R609 Edema, unspecified: Secondary | ICD-10-CM | POA: Diagnosis present

## 2016-08-25 DIAGNOSIS — Z8249 Family history of ischemic heart disease and other diseases of the circulatory system: Secondary | ICD-10-CM

## 2016-08-25 DIAGNOSIS — Z6841 Body Mass Index (BMI) 40.0 and over, adult: Secondary | ICD-10-CM | POA: Diagnosis not present

## 2016-08-25 DIAGNOSIS — E872 Acidosis: Secondary | ICD-10-CM | POA: Diagnosis present

## 2016-08-25 DIAGNOSIS — Z7982 Long term (current) use of aspirin: Secondary | ICD-10-CM

## 2016-08-25 DIAGNOSIS — E11319 Type 2 diabetes mellitus with unspecified diabetic retinopathy without macular edema: Secondary | ICD-10-CM | POA: Diagnosis present

## 2016-08-25 DIAGNOSIS — A419 Sepsis, unspecified organism: Principal | ICD-10-CM | POA: Diagnosis present

## 2016-08-25 DIAGNOSIS — N186 End stage renal disease: Secondary | ICD-10-CM | POA: Diagnosis present

## 2016-08-25 DIAGNOSIS — R0602 Shortness of breath: Secondary | ICD-10-CM

## 2016-08-25 DIAGNOSIS — A491 Streptococcal infection, unspecified site: Secondary | ICD-10-CM | POA: Diagnosis present

## 2016-08-25 DIAGNOSIS — N2581 Secondary hyperparathyroidism of renal origin: Secondary | ICD-10-CM | POA: Diagnosis not present

## 2016-08-25 DIAGNOSIS — E1122 Type 2 diabetes mellitus with diabetic chronic kidney disease: Secondary | ICD-10-CM | POA: Diagnosis not present

## 2016-08-25 DIAGNOSIS — I10 Essential (primary) hypertension: Secondary | ICD-10-CM | POA: Diagnosis present

## 2016-08-25 DIAGNOSIS — Z992 Dependence on renal dialysis: Secondary | ICD-10-CM | POA: Diagnosis not present

## 2016-08-25 DIAGNOSIS — E1129 Type 2 diabetes mellitus with other diabetic kidney complication: Secondary | ICD-10-CM | POA: Diagnosis not present

## 2016-08-25 DIAGNOSIS — Z7984 Long term (current) use of oral hypoglycemic drugs: Secondary | ICD-10-CM | POA: Diagnosis not present

## 2016-08-25 DIAGNOSIS — E1140 Type 2 diabetes mellitus with diabetic neuropathy, unspecified: Secondary | ICD-10-CM | POA: Diagnosis present

## 2016-08-25 DIAGNOSIS — Z79899 Other long term (current) drug therapy: Secondary | ICD-10-CM | POA: Diagnosis not present

## 2016-08-25 DIAGNOSIS — D631 Anemia in chronic kidney disease: Secondary | ICD-10-CM | POA: Diagnosis present

## 2016-08-25 DIAGNOSIS — I959 Hypotension, unspecified: Secondary | ICD-10-CM | POA: Diagnosis not present

## 2016-08-25 DIAGNOSIS — I132 Hypertensive heart and chronic kidney disease with heart failure and with stage 5 chronic kidney disease, or end stage renal disease: Secondary | ICD-10-CM | POA: Diagnosis present

## 2016-08-25 DIAGNOSIS — R6520 Severe sepsis without septic shock: Secondary | ICD-10-CM | POA: Diagnosis not present

## 2016-08-25 DIAGNOSIS — R509 Fever, unspecified: Secondary | ICD-10-CM

## 2016-08-25 DIAGNOSIS — L0291 Cutaneous abscess, unspecified: Secondary | ICD-10-CM

## 2016-08-25 DIAGNOSIS — E119 Type 2 diabetes mellitus without complications: Secondary | ICD-10-CM

## 2016-08-25 DIAGNOSIS — Z833 Family history of diabetes mellitus: Secondary | ICD-10-CM

## 2016-08-25 DIAGNOSIS — I38 Endocarditis, valve unspecified: Secondary | ICD-10-CM | POA: Diagnosis present

## 2016-08-25 DIAGNOSIS — I120 Hypertensive chronic kidney disease with stage 5 chronic kidney disease or end stage renal disease: Secondary | ICD-10-CM | POA: Diagnosis not present

## 2016-08-25 DIAGNOSIS — B955 Unspecified streptococcus as the cause of diseases classified elsewhere: Secondary | ICD-10-CM

## 2016-08-25 DIAGNOSIS — A408 Other streptococcal sepsis: Secondary | ICD-10-CM | POA: Diagnosis not present

## 2016-08-25 DIAGNOSIS — L732 Hidradenitis suppurativa: Secondary | ICD-10-CM | POA: Diagnosis not present

## 2016-08-25 DIAGNOSIS — E1101 Type 2 diabetes mellitus with hyperosmolarity with coma: Secondary | ICD-10-CM | POA: Diagnosis not present

## 2016-08-25 DIAGNOSIS — L0390 Cellulitis, unspecified: Secondary | ICD-10-CM

## 2016-08-25 DIAGNOSIS — I9589 Other hypotension: Secondary | ICD-10-CM | POA: Diagnosis not present

## 2016-08-25 LAB — CBC WITH DIFFERENTIAL/PLATELET
BASOS ABS: 0 10*3/uL (ref 0.0–0.1)
BASOS PCT: 0 %
EOS ABS: 0.1 10*3/uL (ref 0.0–0.7)
EOS PCT: 1 %
HCT: 33.8 % — ABNORMAL LOW (ref 36.0–46.0)
Hemoglobin: 9.8 g/dL — ABNORMAL LOW (ref 12.0–15.0)
Lymphocytes Relative: 4 %
Lymphs Abs: 0.7 10*3/uL (ref 0.7–4.0)
MCH: 29.3 pg (ref 26.0–34.0)
MCHC: 29 g/dL — ABNORMAL LOW (ref 30.0–36.0)
MCV: 101.2 fL — ABNORMAL HIGH (ref 78.0–100.0)
MONO ABS: 0.8 10*3/uL (ref 0.1–1.0)
Monocytes Relative: 4 %
Neutro Abs: 16.3 10*3/uL — ABNORMAL HIGH (ref 1.7–7.7)
Neutrophils Relative %: 91 %
PLATELETS: 447 10*3/uL — AB (ref 150–400)
RBC: 3.34 MIL/uL — ABNORMAL LOW (ref 3.87–5.11)
RDW: 18.3 % — AB (ref 11.5–15.5)
WBC: 17.9 10*3/uL — ABNORMAL HIGH (ref 4.0–10.5)

## 2016-08-25 LAB — RENAL FUNCTION PANEL
ALBUMIN: 2.5 g/dL — AB (ref 3.5–5.0)
ANION GAP: 13 (ref 5–15)
BUN: 60 mg/dL — AB (ref 6–20)
CALCIUM: 8.3 mg/dL — AB (ref 8.9–10.3)
CO2: 23 mmol/L (ref 22–32)
Chloride: 99 mmol/L — ABNORMAL LOW (ref 101–111)
Creatinine, Ser: 9.55 mg/dL — ABNORMAL HIGH (ref 0.44–1.00)
GFR calc Af Amer: 5 mL/min — ABNORMAL LOW (ref >60)
GFR, EST NON AFRICAN AMERICAN: 4 mL/min — AB (ref >60)
GLUCOSE: 141 mg/dL — AB (ref 65–99)
PHOSPHORUS: 1.7 mg/dL — AB (ref 2.5–4.6)
Potassium: 3.4 mmol/L — ABNORMAL LOW (ref 3.5–5.1)
SODIUM: 135 mmol/L (ref 135–145)

## 2016-08-25 LAB — CBC
HCT: 29.9 % — ABNORMAL LOW (ref 36.0–46.0)
Hemoglobin: 8.6 g/dL — ABNORMAL LOW (ref 12.0–15.0)
MCH: 29.3 pg (ref 26.0–34.0)
MCHC: 28.8 g/dL — AB (ref 30.0–36.0)
MCV: 101.7 fL — ABNORMAL HIGH (ref 78.0–100.0)
Platelets: 376 10*3/uL (ref 150–400)
RBC: 2.94 MIL/uL — ABNORMAL LOW (ref 3.87–5.11)
RDW: 18.2 % — AB (ref 11.5–15.5)
WBC: 14.9 10*3/uL — AB (ref 4.0–10.5)

## 2016-08-25 LAB — GLUCOSE, CAPILLARY: GLUCOSE-CAPILLARY: 149 mg/dL — AB (ref 65–99)

## 2016-08-25 LAB — I-STAT CG4 LACTIC ACID, ED: Lactic Acid, Venous: 2.75 mmol/L (ref 0.5–1.9)

## 2016-08-25 LAB — BASIC METABOLIC PANEL
ANION GAP: 15 (ref 5–15)
BUN: 54 mg/dL — AB (ref 6–20)
CALCIUM: 8.9 mg/dL (ref 8.9–10.3)
CO2: 21 mmol/L — ABNORMAL LOW (ref 22–32)
CREATININE: 8.74 mg/dL — AB (ref 0.44–1.00)
Chloride: 98 mmol/L — ABNORMAL LOW (ref 101–111)
GFR calc Af Amer: 5 mL/min — ABNORMAL LOW (ref >60)
GFR, EST NON AFRICAN AMERICAN: 5 mL/min — AB (ref >60)
GLUCOSE: 187 mg/dL — AB (ref 65–99)
Potassium: 3.3 mmol/L — ABNORMAL LOW (ref 3.5–5.1)
Sodium: 134 mmol/L — ABNORMAL LOW (ref 135–145)

## 2016-08-25 MED ORDER — CYCLOBENZAPRINE HCL 10 MG PO TABS
10.0000 mg | ORAL_TABLET | Freq: Three times a day (TID) | ORAL | Status: DC | PRN
  Administered 2016-08-25 – 2016-08-26 (×2): 10 mg via ORAL
  Filled 2016-08-25 (×2): qty 1

## 2016-08-25 MED ORDER — MIDODRINE HCL 5 MG PO TABS
10.0000 mg | ORAL_TABLET | Freq: Three times a day (TID) | ORAL | Status: DC
  Administered 2016-08-25 – 2016-08-31 (×18): 10 mg via ORAL
  Filled 2016-08-25 (×16): qty 2

## 2016-08-25 MED ORDER — SODIUM CHLORIDE 0.9 % IV SOLN: 100.0000 mL | INTRAVENOUS | Status: DC | PRN

## 2016-08-25 MED ORDER — PRAVASTATIN SODIUM 40 MG PO TABS
40.0000 mg | ORAL_TABLET | Freq: Every day | ORAL | Status: DC
  Administered 2016-08-26 – 2016-08-31 (×6): 40 mg via ORAL
  Filled 2016-08-25 (×6): qty 1

## 2016-08-25 MED ORDER — OXYCODONE-ACETAMINOPHEN 5-325 MG PO TABS
1.0000 | ORAL_TABLET | Freq: Once | ORAL | Status: AC
  Administered 2016-08-25: 1 via ORAL
  Filled 2016-08-25: qty 1

## 2016-08-25 MED ORDER — SODIUM CHLORIDE 0.9 % IV SOLN
250.0000 mL | INTRAVENOUS | Status: DC | PRN
  Administered 2016-08-31: 250 mL via INTRAVENOUS

## 2016-08-25 MED ORDER — PENTAFLUOROPROP-TETRAFLUOROETH EX AERO
1.0000 | INHALATION_SPRAY | CUTANEOUS | Status: DC | PRN
Start: 2016-08-25 — End: 2016-08-31

## 2016-08-25 MED ORDER — LIDOCAINE HCL (PF) 1 % IJ SOLN: 5.0000 mL | INTRAMUSCULAR | Status: DC | PRN

## 2016-08-25 MED ORDER — HEPARIN SODIUM (PORCINE) 1000 UNIT/ML DIALYSIS: 1000.0000 [IU] | INTRAMUSCULAR | Status: DC | PRN

## 2016-08-25 MED ORDER — ALBUTEROL SULFATE (2.5 MG/3ML) 0.083% IN NEBU: 2.5000 mg | INHALATION_SOLUTION | RESPIRATORY_TRACT | Status: DC | PRN

## 2016-08-25 MED ORDER — ASPIRIN EC 81 MG PO TBEC
81.0000 mg | DELAYED_RELEASE_TABLET | Freq: Every day | ORAL | Status: DC
  Administered 2016-08-26 – 2016-08-31 (×6): 81 mg via ORAL
  Filled 2016-08-25 (×6): qty 1

## 2016-08-25 MED ORDER — RISAQUAD PO CAPS
ORAL_CAPSULE | Freq: Every day | ORAL | Status: DC
  Administered 2016-08-26 – 2016-08-31 (×6): 1 via ORAL
  Filled 2016-08-25 (×6): qty 1

## 2016-08-25 MED ORDER — DEXTROSE 5 % IV SOLN
2.0000 g | Freq: Once | INTRAVENOUS | Status: AC
  Administered 2016-08-25: 2 g via INTRAVENOUS
  Filled 2016-08-25: qty 2

## 2016-08-25 MED ORDER — MIDODRINE HCL 5 MG PO TABS: 10.0000 mg | ORAL_TABLET | Freq: Every day | ORAL | Status: DC

## 2016-08-25 MED ORDER — POLYETHYLENE GLYCOL 3350 17 G PO PACK: 17.0000 g | PACK | Freq: Every day | ORAL | Status: DC | PRN

## 2016-08-25 MED ORDER — LEVOTHYROXINE SODIUM 75 MCG PO TABS
75.0000 ug | ORAL_TABLET | Freq: Every day | ORAL | Status: DC
  Administered 2016-08-26 – 2016-08-31 (×6): 75 ug via ORAL
  Filled 2016-08-25: qty 3
  Filled 2016-08-25 (×5): qty 1

## 2016-08-25 MED ORDER — ALTEPLASE 2 MG IJ SOLR: 2.0000 mg | Freq: Once | INTRAMUSCULAR | Status: DC | PRN

## 2016-08-25 MED ORDER — SUCROFERRIC OXYHYDROXIDE 500 MG PO CHEW
500.0000 mg | CHEWABLE_TABLET | Freq: Three times a day (TID) | ORAL | Status: DC
  Administered 2016-08-26 – 2016-08-27 (×3): 500 mg via ORAL
  Filled 2016-08-25 (×6): qty 1

## 2016-08-25 MED ORDER — ONDANSETRON HCL 4 MG PO TABS: 4.0000 mg | ORAL_TABLET | Freq: Four times a day (QID) | ORAL | Status: DC | PRN

## 2016-08-25 MED ORDER — DULOXETINE HCL 60 MG PO CPEP
60.0000 mg | ORAL_CAPSULE | Freq: Every day | ORAL | Status: DC
  Administered 2016-08-26 – 2016-08-31 (×6): 60 mg via ORAL
  Filled 2016-08-25 (×6): qty 1

## 2016-08-25 MED ORDER — RENA-VITE PO TABS
1.0000 | ORAL_TABLET | Freq: Every day | ORAL | Status: DC
  Administered 2016-08-26 – 2016-08-31 (×6): 1 via ORAL
  Filled 2016-08-25 (×6): qty 1

## 2016-08-25 MED ORDER — SODIUM CHLORIDE 0.9% FLUSH
3.0000 mL | Freq: Two times a day (BID) | INTRAVENOUS | Status: DC
  Administered 2016-08-26 – 2016-08-30 (×7): 3 mL via INTRAVENOUS

## 2016-08-25 MED ORDER — CALCIUM ACETATE 667 MG PO TABS: 2001.0000 mg | ORAL_TABLET | Freq: Three times a day (TID) | ORAL | Status: DC

## 2016-08-25 MED ORDER — VANCOMYCIN HCL IN DEXTROSE 1-5 GM/200ML-% IV SOLN
1000.0000 mg | Freq: Once | INTRAVENOUS | Status: AC
  Administered 2016-08-25: 1000 mg via INTRAVENOUS
  Filled 2016-08-25: qty 200

## 2016-08-25 MED ORDER — ONDANSETRON HCL 4 MG/2ML IJ SOLN: 4.0000 mg | Freq: Four times a day (QID) | INTRAMUSCULAR | Status: DC | PRN

## 2016-08-25 MED ORDER — GLIPIZIDE 2.5 MG HALF TABLET
2.5000 mg | ORAL_TABLET | Freq: Every day | ORAL | Status: DC
  Administered 2016-08-27 – 2016-08-31 (×5): 2.5 mg via ORAL
  Filled 2016-08-25 (×6): qty 1

## 2016-08-25 MED ORDER — CALCIUM ACETATE (PHOS BINDER) 667 MG PO CAPS
2001.0000 mg | ORAL_CAPSULE | Freq: Three times a day (TID) | ORAL | Status: DC
  Administered 2016-08-26 – 2016-08-27 (×3): 2001 mg via ORAL
  Filled 2016-08-25 (×3): qty 3

## 2016-08-25 MED ORDER — ACETAMINOPHEN 325 MG PO TABS
650.0000 mg | ORAL_TABLET | Freq: Four times a day (QID) | ORAL | Status: DC | PRN
  Administered 2016-08-25 – 2016-08-30 (×4): 650 mg via ORAL
  Filled 2016-08-25 (×2): qty 2

## 2016-08-25 MED ORDER — LIDOCAINE-PRILOCAINE 2.5-2.5 % EX CREA
1.0000 | TOPICAL_CREAM | CUTANEOUS | Status: DC | PRN
Start: 2016-08-25 — End: 2016-08-31

## 2016-08-25 MED ORDER — SODIUM CHLORIDE 0.9% FLUSH: 3.0000 mL | INTRAVENOUS | Status: DC | PRN

## 2016-08-25 MED ORDER — HEPARIN SODIUM (PORCINE) 5000 UNIT/ML IJ SOLN
5000.0000 [IU] | Freq: Three times a day (TID) | INTRAMUSCULAR | Status: DC
  Administered 2016-08-25 – 2016-08-31 (×16): 5000 [IU] via SUBCUTANEOUS
  Filled 2016-08-25 (×15): qty 1

## 2016-08-25 MED ORDER — INSULIN ASPART 100 UNIT/ML ~~LOC~~ SOLN
0.0000 [IU] | Freq: Three times a day (TID) | SUBCUTANEOUS | Status: DC
  Administered 2016-08-26 – 2016-08-27 (×3): 2 [IU] via SUBCUTANEOUS
  Administered 2016-08-27 – 2016-08-28 (×3): 1 [IU] via SUBCUTANEOUS
  Administered 2016-08-29 – 2016-08-30 (×2): 2 [IU] via SUBCUTANEOUS

## 2016-08-25 MED ORDER — CALCITRIOL 0.5 MCG PO CAPS
1.5000 ug | ORAL_CAPSULE | ORAL | Status: DC
  Filled 2016-08-25 (×2): qty 3

## 2016-08-25 MED ORDER — FAMOTIDINE 20 MG PO TABS
20.0000 mg | ORAL_TABLET | Freq: Every day | ORAL | Status: DC
  Administered 2016-08-25 – 2016-08-30 (×6): 20 mg via ORAL
  Filled 2016-08-25 (×6): qty 1

## 2016-08-25 MED ORDER — SODIUM CHLORIDE 0.9% FLUSH
3.0000 mL | Freq: Two times a day (BID) | INTRAVENOUS | Status: DC
  Administered 2016-08-25 – 2016-08-31 (×6): 3 mL via INTRAVENOUS

## 2016-08-25 MED ORDER — SENNA 8.6 MG PO TABS
1.0000 | ORAL_TABLET | Freq: Two times a day (BID) | ORAL | Status: DC
  Administered 2016-08-25 – 2016-08-31 (×12): 8.6 mg via ORAL
  Filled 2016-08-25 (×12): qty 1

## 2016-08-25 MED ORDER — CINACALCET HCL 30 MG PO TABS
60.0000 mg | ORAL_TABLET | Freq: Every day | ORAL | Status: DC
  Administered 2016-08-26 – 2016-08-31 (×6): 60 mg via ORAL
  Filled 2016-08-25 (×6): qty 2

## 2016-08-25 MED ORDER — BOOST / RESOURCE BREEZE PO LIQD
1.0000 | Freq: Three times a day (TID) | ORAL | Status: DC
  Administered 2016-08-26 – 2016-08-30 (×8): 1 via ORAL

## 2016-08-25 MED ORDER — CLINDAMYCIN PHOSPHATE 600 MG/50ML IV SOLN
600.0000 mg | Freq: Three times a day (TID) | INTRAVENOUS | Status: DC
  Administered 2016-08-25 – 2016-08-26 (×2): 600 mg via INTRAVENOUS
  Filled 2016-08-25: qty 50

## 2016-08-25 MED ORDER — ACETAMINOPHEN 650 MG RE SUPP: 650.0000 mg | Freq: Four times a day (QID) | RECTAL | Status: DC | PRN

## 2016-08-25 MED ORDER — GABAPENTIN 100 MG PO CAPS
100.0000 mg | ORAL_CAPSULE | Freq: Every day | ORAL | Status: DC
  Administered 2016-08-25 – 2016-08-30 (×6): 100 mg via ORAL
  Filled 2016-08-25 (×6): qty 1

## 2016-08-25 MED ORDER — NEBIVOLOL HCL 2.5 MG PO TABS
2.5000 mg | ORAL_TABLET | Freq: Every day | ORAL | Status: DC
  Administered 2016-08-26 – 2016-08-31 (×5): 2.5 mg via ORAL
  Filled 2016-08-25 (×6): qty 1

## 2016-08-25 MED ORDER — TRAZODONE HCL 50 MG PO TABS
50.0000 mg | ORAL_TABLET | Freq: Every evening | ORAL | Status: DC | PRN
  Administered 2016-08-26: 50 mg via ORAL
  Filled 2016-08-25 (×2): qty 1

## 2016-08-25 NOTE — ED Notes (Signed)
RN attempted to pull blood off IV for blood culture. Unable to pull back specimen. Lab called to collect blood cultures.

## 2016-08-25 NOTE — ED Notes (Signed)
Attempted report 

## 2016-08-25 NOTE — Consult Note (Addendum)
Reason for Consult: To manage dialysis and dialysis related needs Referring Physician: Dr. Gardenia Phlegm [name] is an 55 y.o. female.  HPI: Pt is a 79F with ESRD on HD , CHF, chronic hypotension, hidradenitis with recurrent superinfection who presents with malaise, weakness, difficulty concentrating.    Pt felt dizzy and disoriented yesterday and today.  She called the dialysis clinic and was told to go to the ED .  Was found to be hypotensive in the 70s-80s.  Lesions on buttocks and groin area with increased drainage and odor.    Feels weak.    Dialyzes at [facility]  EDW 162.5 kg HD 4 hrs 30 min Bath 2K / 2.5 Ca Dialyzer F200 BFR 500 Heparin 5000 bolus 5000 mid-run Access L AVG  Venofer 50 q weekly Mircera 225 mcg q 2 weeks last given 9/27 Calcitriol 1.50 mcg q rx  Past Medical History:  Diagnosis Date  . Chronic diastolic CHF (congestive heart failure) ([place])   . Chronic kidney disease (CKD), stage III (moderate)    [name] nephrology lov note 05-12-2013 on pt chart now on HD  . DCM (dilated cardiomyopathy) ([place])    nonischemic  . Depression   . DM type 2 (diabetes mellitus, type 2) ([facility])    diet controlledwith retinopathy and nephropathy  . GERD (gastroesophageal reflux disease)   . H/O cardiac arrest 10/2012  . Hard of hearing 10/2012   now wears 2 hearing aids  . History of hemodialysis dec 2013  . HTN (hypertension)   . Hydradenitis   . Hyperkalemia   . Hyperlipidemia   . Hypothyroidism   . Iron deficiency anemia   . Morbid obesity ([place])   . Multinodular goiter   . Pneumonia Nov 10, 2012  . Sleep apnea    no test yet per mother 10/22/13   . SOB (shortness of breath)    hx of   . Urinary tract infection    taking antibiotics for 3 days prior to surgery    Past Surgical History:  Procedure Laterality Date  . AV FISTULA PLACEMENT Left 07/25/2013   Procedure: ARTERIOVENOUS (AV) FISTULA CREATION ;  Surgeon: Rosetta Posner, MD;  Location: [facility];  Service: Vascular;   Laterality: Left;  . CYSTOSCOPY W/ RETROGRADES  11/16/2012   Procedure: CYSTOSCOPY WITH RETROGRADE PYELOGRAM;  Surgeon: Reece Packer, MD;  [facility];  Service: Urology;  Laterality: Bilateral;  CYSTOSCOPY,BILATERAL RETROGRADE PYELOGRAM/ REMOVAL LEFT URETERAL STENT/ FULGERATION BLADDER MUCOSA/ INSERTION RIGHT URETERAL STENT  . CYSTOSCOPY W/ URETERAL STENT PLACEMENT  05/28/2012   Procedure: CYSTOSCOPY WITH RETROGRADE PYELOGRAM/URETERAL STENT PLACEMENT;  Surgeon: Reece Packer, MD;  [facility];  Service: Urology;  Laterality: Left;  . CYSTOSCOPY WITH RETROGRADE PYELOGRAM, URETEROSCOPY AND STENT PLACEMENT Right 06/23/2013   Procedure: CYSTOSCOPY WITH RIGHT URETEROSCOPY, RIGHT  RETROGRADE PYELOGRAM, WITH LASER LIPOTRIPSY AND RIGHT URETERAL STENT EXCHANGE ;  Surgeon: Molli Hazard, MD;  [facility];  Service: Urology;  Laterality: Right;  STENT EXCHANGE    . CYSTOSCOPY WITH RETROGRADE PYELOGRAM, URETEROSCOPY AND STENT PLACEMENT Right 11/06/2013   Procedure: CYSTOSCOPY WITH RETROGRADE PYELOGRAM, URETEROSCOPY STONE REMOVAL AND STENT REMOVAL;  Surgeon: Molli Hazard, MD;  [facility];  Service: Urology;  Laterality: Right;  . HOLMIUM LASER APPLICATION N/A 9/76/7341   Procedure: HOLMIUM LASER APPLICATION;  Surgeon: Molli Hazard, MD;  [facility];  Service: Urology;  Laterality: N/A;  . INSERTION OF DIALYSIS CATHETER N/A 09/24/2013   Procedure: INSERTION OF DIALYSIS  CATHETER;  Surgeon: Rosetta Posner, MD;  Location: [place] [facility];  Service: Vascular;  Laterality: N/A;  . PORT-A-CATH REMOVAL    . PORTACATH PLACEMENT    . REVISON OF ARTERIOVENOUS FISTULA Left 07/25/2016   Procedure: REVISON OF LEFT ARTERIOVENOUS FISTULA;  Surgeon: Elam Dutch, MD;  Location: [facility];  Service: Vascular;  Laterality: Left;    Family History  Problem Relation Age of Onset  . Coronary artery disease Mother   . Hypertension Mother   . Diabetes type II Mother   . Coronary  artery disease Father   . Hypertension Father   . Malignant hyperthermia Father   . Cancer Maternal Grandfather     ? Type  . Kidney failure Maternal Grandmother     Social History:  reports that she quit smoking about 36 years ago. Her smoking use included Cigarettes. She has a 0.25 pack-year smoking history. She has never used smokeless tobacco. She reports that she does not drink alcohol or use drugs.  Allergies:  Allergies  Allergen Reactions  . Rosiglitazone Maleate Swelling and Other (See Comments)    SWELLING REACTION UNSPECIFIED   . Amoxicillin Rash and Other (See Comments)    Tolerated Zosyn 01/2013. [place]  . Amoxicillin-Pot Clavulanate Diarrhea    Medications: I have reviewed the patient's current medications.   Results for orders placed or performed during the hospital encounter of 08/25/16 (from the past 48 hour(s))  Basic metabolic panel     Status: Abnormal   Collection Time: 08/25/16 11:31 AM  Result Value Ref Range   Sodium 134 (L) 135 - 145 mmol/L   Potassium 3.3 (L) 3.5 - 5.1 mmol/L   Chloride 98 (L) 101 - 111 mmol/L   CO2 21 (L) 22 - 32 mmol/L   Glucose, Bld 187 (H) 65 - 99 mg/dL   BUN 54 (H) 6 - 20 mg/dL   Creatinine, Ser 8.74 (H) 0.44 - 1.00 mg/dL   Calcium 8.9 8.9 - 10.3 mg/dL   GFR calc non Af Amer 5 (L) >60 mL/min   GFR calc Af Amer 5 (L) >60 mL/min    Comment: (NOTE) The eGFR has been calculated using the CKD EPI equation. This calculation has not been validated in all clinical situations. eGFR's persistently <60 mL/min signify possible Chronic Kidney Disease.    Anion gap 15 5 - 15  CBC with Differential     Status: Abnormal   Collection Time: 08/25/16 11:31 AM  Result Value Ref Range   WBC 17.9 (H) 4.0 - 10.5 K/uL   RBC 3.34 (L) 3.87 - 5.11 MIL/uL   Hemoglobin 9.8 (L) 12.0 - 15.0 g/dL   HCT 33.8 (L) 36.0 - 46.0 %   MCV 101.2 (H) 78.0 - 100.0 fL   MCH 29.3 26.0 - 34.0 pg   MCHC 29.0 (L) 30.0 - 36.0 g/dL   RDW 18.3 (H) 11.5 - 15.5 %    Platelets 447 (H) 150 - 400 K/uL   Neutrophils Relative % 91 %   Neutro Abs 16.3 (H) 1.7 - 7.7 K/uL   Lymphocytes Relative 4 %   Lymphs Abs 0.7 0.7 - 4.0 K/uL   Monocytes Relative 4 %   Monocytes Absolute 0.8 0.1 - 1.0 K/uL   Eosinophils Relative 1 %   Eosinophils Absolute 0.1 0.0 - 0.7 K/uL   Basophils Relative 0 %   Basophils Absolute 0.0 0.0 - 0.1 K/uL  I-Stat CG4 Lactic Acid, ED     Status: Abnormal   Collection  Time: 08/25/16 12:48 PM  Result Value Ref Range   Lactic Acid, Venous 2.75 (HH) 0.5 - 1.9 mmol/L   Comment NOTIFIED PHYSICIAN   Aerobic/Anaerobic Culture (surgical/deep wound)     Status: None (Preliminary result)   Collection Time: 08/25/16  1:53 PM  Result Value Ref Range   Specimen Description BUTTOCKS    Special Requests Immunocompromised    Gram Stain      FEW WBC PRESENT, PREDOMINANTLY PMN ABUNDANT GRAM POSITIVE COCCI IN PAIRS IN CLUSTERS ABUNDANT GRAM NEGATIVE RODS MODERATE GRAM VARIABLE ROD    Culture PENDING    Report Status PENDING     Dg Chest 1 View  Result Date: 08/25/2016 CLINICAL DATA:  Shortness of breath for few days EXAM: CHEST 1 VIEW COMPARISON:  04/30/2015 FINDINGS: Cardiomegaly with progression from prior exam. Central mild vascular congestion and mild interstitial prominence bilateral suspicious for mild interstitial edema. Elevation of the right hemidiaphragm. Bilateral basilar atelectasis. No segmental infiltrate. IMPRESSION: Central mild vascular congestion and mild interstitial prominence bilateral suspicious for mild interstitial edema. Elevation of the right hemidiaphragm. Bilateral basilar atelectasis. No segmental infiltrate. Electronically Signed   By: Lahoma Crocker M.D.   On: 08/25/2016 13:39    ROS: All other systems reviewed and are negative Blood pressure (!) 82/45, pulse 97, temperature 102.2 F (39 C), temperature source Rectal, resp. rate (!) 28, height '5\' 6"'  (1.676 m), weight 136.1 kg (300 lb), last menstrual period 06/13/2013,  SpO2 100 %. .   GEN: chronically ill appearing  HEENT: hard of hearing, sclerae anicteric NECK: no JVD PULM: distant breath sounds, no crackles, diffusely poor air movement CV : distant heart sounds, regular, NAD ABD: protuberant, obese, nontender EXT: 1+ LE edema SKIN: multiple erosions and lesions buttocks and groin with serous drainage and odor  Assessment/Plan: 1 sepsis: 2/2 hidradenitis.  Getting vancomycin (9/29) Gram stain with abundant GNRs, GPCs pairs/ clusters, and gram variable rods; blood cx pending will also order ceftaz 2g IV once (will need to be re-dosed after dialysis tomorrow 9/30). 2 ESRD: on MWF.  In setting of hypotension, will not dialyze today and will defer to tomorrow 9/30  3 Hypotension: on midodrine 10 mg QHS.  Will need this tonight 9/30 in preparation for potential dialysis tomorrow 4. Anemia of ESRD: on Mircera 225 q 2 weeks, Hgb 9.5, no iron in setting of infection 5. 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 Calcitriol 1.50 mcg q dialysis 6.  Nutrition: albumin 3.0, will need prostat  Paije Goodhart 08/25/2016, 3:30 PM

## 2016-08-25 NOTE — ED Provider Notes (Signed)
[facility] DEPT Provider Note   CSN: [id number] Arrival date & time: 08/25/16  1112     History   Chief Complaint Chief Complaint  Patient presents with  . Weakness    HPI [name] is a 55 y.o. female.  HPI   Pt with hx CHF, ESRD on dialysis, DM, hidradenitis supprativa p/w subjective fever, generalized weakness, disorientation (feeling like she is in a fog, difficulty concentrating) since this morning.  Has large draining areas of on her buttocks and groin that have started draining again - she started keflex for these areas yesterday.  Has had cough with clear sputum since earlier this week.  Denies chest pain, SOB, abdominal pain, bowel changes, trauma/falls.  Still makes urine, though very little, no changes.  PCP Dr Harlan Stains  Past Medical History:  Diagnosis Date  . Chronic diastolic CHF (congestive heart failure) ([place])   . Chronic kidney disease (CKD), stage III (moderate)    [name] nephrology lov note 05-12-2013 on pt chart now on HD  . DCM (dilated cardiomyopathy) ([place])    nonischemic  . Depression   . DM type 2 (diabetes mellitus, type 2) (H[facility])    diet controlledwith retinopathy and nephropathy  . GERD (gastroesophageal reflux disease)   . H/O cardiac arrest 10/2012  . Hard of hearing 10/2012   now wears 2 hearing aids  . History of hemodialysis dec 2013  . HTN (hypertension)   . Hydradenitis   . Hyperkalemia   . Hyperlipidemia   . Hypothyroidism   . Iron deficiency anemia   . Morbid obesity ([place])   . Multinodular goiter   . Pneumonia Nov 10, 2012  . Sleep apnea    no test yet per mother 10/22/13   . SOB (shortness of breath)    hx of   . Urinary tract infection    taking antibiotics for 3 days prior to surgery    Patient Active Problem List   Diagnosis Date Noted  . ESRD (end stage renal disease) on dialysis ([place]) 08/25/2016  . Sepsis affecting skin 08/25/2016  . Absolute anemia   . Pressure ulcer 05/01/2015  . Fever 04/30/2015    . Hydradenitis 04/30/2015  . Chronic respiratory failure ([place]) 11/10/2013  . COPD (chronic obstructive pulmonary disease) ([place]) 11/06/2013  . DCM (dilated cardiomyopathy) ([place])   . End stage renal disease ([place]) 08/26/2013  . UTI (urinary tract infection) 07/18/2013  . OSA (obstructive sleep apnea) 07/17/2013  . Chronic diastolic CHF (congestive heart failure) ([place]) 07/16/2013  . SOB (shortness of breath) 07/11/2013  . Metabolic acidosis 94/85/4627  . Leucocytosis 01/24/2013  . Hypertension 11/30/2012  . Atelectasis 11/19/2012  . Sepsis ([place]) 11/10/2012  . ARF (acute renal failure) ([place]) 06/03/2012  . Hydronephrosis of left kidney 05/28/2012  . Nephrolithiasis 05/28/2012  . Hidradenitis suppurativa 05/21/2012  . Symptomatic anemia 05/21/2012  . DM type 2 (diabetes mellitus, type 2) ([place])   . Chronic kidney disease (CKD), stage V ([place])   . Mood disorder in conditions classified elsewhere 09/06/2010  . ERYTHEMA NODOSUM, HX OF 07/27/2010  . GOITER, MULTINODULAR 05/18/2010  . Morbid obesity ([place]) 05/18/2010  . GERD 05/18/2010    Past Surgical History:  Procedure Laterality Date  . AV FISTULA PLACEMENT Left 07/25/2013   Procedure: ARTERIOVENOUS (AV) FISTULA CREATION ;  Surgeon: Rosetta Posner, MD;  Location: [facility];  Service: Vascular;  Laterality: Left;  . CYSTOSCOPY W/ RETROGRADES  11/16/2012   Procedure: CYSTOSCOPY WITH RETROGRADE PYELOGRAM;  Surgeon: Elayne Snare  MacDiarmid, MD;  Location: [facility];  Service: Urology;  Laterality: Bilateral;  CYSTOSCOPY,BILATERAL RETROGRADE PYELOGRAM/ REMOVAL LEFT URETERAL STENT/ FULGERATION BLADDER MUCOSA/ INSERTION RIGHT URETERAL STENT  . CYSTOSCOPY W/ URETERAL STENT PLACEMENT  05/28/2012   Procedure: CYSTOSCOPY WITH RETROGRADE PYELOGRAM/URETERAL STENT PLACEMENT;  Surgeon: Reece Packer, MD;  Location: [facility];  Service: Urology;  Laterality: Left;  . CYSTOSCOPY WITH RETROGRADE PYELOGRAM, URETEROSCOPY AND STENT PLACEMENT Right 06/23/2013   Procedure:  CYSTOSCOPY WITH RIGHT URETEROSCOPY, RIGHT  RETROGRADE PYELOGRAM, WITH LASER LIPOTRIPSY AND RIGHT URETERAL STENT EXCHANGE ;  Surgeon: Molli Hazard, MD;  Location: [facility];  Service: Urology;  Laterality: Right;  STENT EXCHANGE    . CYSTOSCOPY WITH RETROGRADE PYELOGRAM, URETEROSCOPY AND STENT PLACEMENT Right 11/06/2013   Procedure: CYSTOSCOPY WITH RETROGRADE PYELOGRAM, URETEROSCOPY STONE REMOVAL AND STENT REMOVAL;  Surgeon: Molli Hazard, MD;  Location: [facility];  Service: Urology;  Laterality: Right;  . HOLMIUM LASER APPLICATION N/A 0/27/2536   Procedure: HOLMIUM LASER APPLICATION;  Surgeon: Molli Hazard, MD;  Location: [facility];  Service: Urology;  Laterality: N/A;  . INSERTION OF DIALYSIS CATHETER N/A 09/24/2013   Procedure: INSERTION OF DIALYSIS CATHETER;  Surgeon: Rosetta Posner, MD;  Location: [facility];  Service: Vascular;  Laterality: N/A;  . PORT-A-CATH REMOVAL    . PORTACATH PLACEMENT    . REVISON OF ARTERIOVENOUS FISTULA Left 07/25/2016   Procedure: REVISON OF LEFT ARTERIOVENOUS FISTULA;  Surgeon: Elam Dutch, MD;  Location: [facility] OR;  Service: Vascular;  Laterality: Left;    OB History    No data available       Home Medications    Prior to Admission medications   Medication Sig Start Date End Date Taking? Authorizing Provider  aspirin EC 81 MG tablet Take 81 mg by mouth daily.   Yes Historical Provider, MD  BYSTOLIC 2.5 MG tablet Take 2.5 mg by mouth daily.  07/05/16  Yes Historical Provider, MD  Calcium Acetate 667 MG TABS Take 2,001 mg by mouth 3 (three) times daily with meals.    Yes Historical Provider, MD  cephALEXin (KEFLEX) 500 MG capsule Take 500 mg by mouth 3 (three) times daily.  06/15/16  Yes Historical Provider, MD  clindamycin (CLEOCIN T) 1 % external solution Apply 1 application topically 2 (two) times daily. TO THE AFFECTED AREAS 09/07/15  Yes Historical Provider, MD  DULoxetine (CYMBALTA) 60 MG capsule Take 1 capsule (60 mg total) by mouth daily.  12/03/12  Yes Janece Canterbury, MD  famotidine (PEPCID) 20 MG tablet TAKE 1 TABLET BY MOUTH AT BEDTIME Patient taking differently: Take 20 mg by mouth at bedtime 10/15/14  Yes Tanda Rockers, MD  feeding supplement, RESOURCE BREEZE, (RESOURCE BREEZE) LIQD Take 1 Container by mouth 3 (three) times daily with meals.   Yes Historical Provider, MD  gabapentin (NEURONTIN) 100 MG capsule Take 100 mg by mouth at bedtime.  06/15/16  Yes Historical Provider, MD  glipiZIDE (GLUCOTROL) 5 MG tablet Take 2.5 mg by mouth daily. 06/12/16  Yes Historical Provider, MD  isosorbide mononitrate (IMDUR) 30 MG 24 hr tablet Take 30 mg by mouth every morning.   Yes Historical Provider, MD  levothyroxine (SYNTHROID, LEVOTHROID) 75 MCG tablet Take 75 mcg by mouth daily. 08/17/15  Yes Historical Provider, MD  midodrine (PROAMATINE) 10 MG tablet Take 10 mg by mouth at bedtime.  07/06/16  Yes Historical Provider, MD  multivitamin (RENA-VIT) TABS tablet Take 1 tablet by mouth daily. 08/16/15  Yes Historical Provider, MD  oxyCODONE-acetaminophen (  PERCOCET/ROXICET) 5-325 MG tablet Take 1-2 tablets by mouth every 6 (six) hours as needed. Patient has not taken since started dialysis 08/2013 due to causes hypotension Patient taking differently: Take 1-2 tablets by mouth every 6 (six) hours as needed for moderate pain.  07/25/16  Yes Alvia Grove, PA-C  pravastatin (PRAVACHOL) 40 MG tablet Take 40 mg by mouth daily. Patient takes in am 12/03/12  Yes Janece Canterbury, MD  Probiotic Product (PROBIOTIC DAILY PO) Take 1 tablet by mouth daily.    Yes Historical Provider, MD  SENSIPAR 60 MG tablet Take 60 mg by mouth daily.  08/17/15  Yes Historical Provider, MD  sucroferric oxyhydroxide (VELPHORO) 500 MG chewable tablet Chew 500 mg by mouth 3 (three) times daily with meals.   Yes Historical Provider, MD  cyclobenzaprine (FLEXERIL) 10 MG tablet Take 1 tablet (10 mg total) by mouth 3 (three) times daily as needed for muscle spasms. For Spasms Patient  not taking: Reported on 08/25/2016 12/03/12   Janece Canterbury, MD    Family History Family History  Problem Relation Age of Onset  . Coronary artery disease Mother   . Hypertension Mother   . Diabetes type II Mother   . Coronary artery disease Father   . Hypertension Father   . Malignant hyperthermia Father   . Cancer Maternal Grandfather     ? Type  . Kidney failure Maternal Grandmother     Social History Social History  Substance Use Topics  . Smoking status: Former Smoker    Packs/day: 0.25    Years: 1.00    Types: Cigarettes    Quit date: 11/28/1979  . Smokeless tobacco: Never Used  . Alcohol use No     Allergies   Rosiglitazone maleate; Amoxicillin; and Amoxicillin-pot clavulanate   Review of Systems Review of Systems  All other systems reviewed and are negative.    Physical Exam Updated Vital Signs BP 107/63   Pulse 81   Temp 102.2 F (39 C) (Rectal)   Resp 25   Ht 5\' 6"  (1.676 m)   Wt 136.1 kg   LMP 06/13/2013   SpO2 100%   BMI 48.42 kg/m   Physical Exam  Constitutional: She appears well-developed and well-nourished. No distress.  Morbidly obese  HENT:  Head: Normocephalic and atraumatic.  Neck: Neck supple.  Cardiovascular: Normal rate and regular rhythm.   Left upper arm fistula with thrill and bruit  Pulmonary/Chest: Effort normal and breath sounds normal. No respiratory distress. She has no wheezes. She has no rales.  Abdominal: Soft. She exhibits no distension. There is no tenderness. There is no rebound and no guarding.  Neurological: She is alert.  Skin: She is not diaphoretic.  Large areas over inferior buttocks and groin with chronic wounds.  Left buttock with actively draining tracts, drainage is yellow and malodorous.  Tender to palpation.  Small amount of erythema distal to draining areas.    Nursing note and vitals reviewed.    ED Treatments / Results  Labs (all labs ordered are listed, but only abnormal results are  displayed) Labs Reviewed  BASIC METABOLIC PANEL - Abnormal; Notable for the following:       Result Value   Sodium 134 (*)    Potassium 3.3 (*)    Chloride 98 (*)    CO2 21 (*)    Glucose, Bld 187 (*)    BUN 54 (*)    Creatinine, Ser 8.74 (*)    GFR calc non Af Amer 5 (*)  GFR calc Af Amer 5 (*)    All other components within normal limits  CBC WITH DIFFERENTIAL/PLATELET - Abnormal; Notable for the following:    WBC 17.9 (*)    RBC 3.34 (*)    Hemoglobin 9.8 (*)    HCT 33.8 (*)    MCV 101.2 (*)    MCHC 29.0 (*)    RDW 18.3 (*)    Platelets 447 (*)    Neutro Abs 16.3 (*)    All other components within normal limits  I-STAT CG4 LACTIC ACID, ED - Abnormal; Notable for the following:    Lactic Acid, Venous 2.75 (*)    All other components within normal limits  AEROBIC/ANAEROBIC CULTURE (SURGICAL/DEEP WOUND)  CULTURE, BLOOD (ROUTINE X 2)  CULTURE, BLOOD (ROUTINE X 2)    EKG  EKG Interpretation  Date/Time:  Friday August 25 2016 11:24:37 EDT Ventricular Rate:  96 PR Interval:  172 QRS Duration: 94 QT Interval:  376 QTC Calculation: 475 R Axis:   74 Text Interpretation:  Normal sinus rhythm Low voltage QRS Borderline ECG Borderline ECG Confirmed by Carmin Muskrat  MD [phone]) on 08/25/2016 12:29:01 PM       Radiology Dg Chest 1 View  Result Date: 08/25/2016 CLINICAL DATA:  Shortness of breath for few days EXAM: CHEST 1 VIEW COMPARISON:  04/30/2015 FINDINGS: Cardiomegaly with progression from prior exam. Central mild vascular congestion and mild interstitial prominence bilateral suspicious for mild interstitial edema. Elevation of the right hemidiaphragm. Bilateral basilar atelectasis. No segmental infiltrate. IMPRESSION: Central mild vascular congestion and mild interstitial prominence bilateral suspicious for mild interstitial edema. Elevation of the right hemidiaphragm. Bilateral basilar atelectasis. No segmental infiltrate. Electronically Signed   By: Lahoma Crocker  M.D.   On: 08/25/2016 13:39    Procedures Procedures (including critical care time)  Medications Ordered in ED Medications  vancomycin (VANCOCIN) IVPB 1000 mg/200 mL premix (not administered)  oxyCODONE-acetaminophen (PERCOCET/ROXICET) 5-325 MG per tablet 1 tablet (1 tablet Oral Given 08/25/16 1402)     Initial Impression / Assessment and Plan / ED Course  I have reviewed the triage vital signs and the nursing notes.  Pertinent labs & imaging results that were available during my care of the patient were reviewed by me and considered in my medical decision making (see chart for details).  Clinical Course    Diabetic dialysis patient with fever, generalized weakness, purulent drainage from left buttock.  Rectal temp 102, WBC 17.  Blood pressure at times low but given dialysis and review of other visit, suspect this is not far from her baseline.  Cultures from wound and blood cultures obtained, vancomycin ordered.  Admitted to [facility] for continued treatment, Dr Denton Brick accepting.    Final Clinical Impressions(s) / ED Diagnoses   Final diagnoses:  Hidradenitis suppurativa  Other specified fever  Cellulitis and abscess  ESRD (end stage renal disease) on dialysis [facility])    New Prescriptions New Prescriptions   No medications on file     Clayton Bibles, PA-C 08/25/16 [place], MD 08/26/16 2327

## 2016-08-25 NOTE — Progress Notes (Signed)
Pharmacy Antibiotic Note  [name] is a 55 y.o. female admitted on 08/25/2016 with draining abscess on buttock. Pharmacy has been consulted for vancomycin dosing.  Patient is ESRD with dialysis Monday, Wednesday, Friday.  Plan: Vancomycin 1000 IV once.  Goal trough 10-15 mcg/mL.  Height: 5\' 6"  (167.6 cm) Weight: 300 lb (136.1 kg) IBW/kg (Calculated) : 59.3  Temp (24hrs), Avg:100.9 F (38.3 C), Min:99.5 F (37.5 C), Max:102.2 F (39 C)   Recent Labs Lab 08/25/16 1131 08/25/16 1248  WBC 17.9*  --   CREATININE 8.74*  --   LATICACIDVEN  --  2.75*    Estimated Creatinine Clearance: 10.5 mL/min (by C-G formula based on SCr of 8.74 mg/dL (H)).   Allergies  Allergen Reactions  . Rosiglitazone Maleate Swelling and Other (See Comments)    SWELLING REACTION UNSPECIFIED   . Amoxicillin Rash and Other (See Comments)    Tolerated Zosyn 01/2013. [place]  . Amoxicillin-Pot Clavulanate Diarrhea   Antimicrobials this admission: 9/29 vancomycin >>   Dose adjustments this admission: Vancomycin   Microbiology results: 9/29 BCx: pending  Thank you for allowing pharmacy to be a part of this patient's care.  Georga Bora, PharmD Clinical Pharmacist Pager: [phone] 08/25/2016 3:03 PM

## 2016-08-25 NOTE — H&P (Signed)
Patient Demographics:    [name], is a 55 y.o. female  MRN: [id number]   DOB - 1961/08/20  Admit Date - 08/25/2016  Outpatient Primary MD for the patient is [name], MD   Assessment & Plan:    Principal Problem:   Sepsis affecting skin Active Problems:   Morbid obesity ([place])   DM type 2 (diabetes mellitus, type 2) ([place])   Hypertension   Hydradenitis   ESRD (end stage renal disease) on dialysis ([place])    1)Sepsis secondary to infected hidradenitis suppurativa wounds with purulent drainage- lactic acid elevated at 2.75, WBC is about 18,000, blood pressure is low with systolic blood pressure in the 80s, temp 102.2-treat empirically with IV vancomycin, patient, husband and her mother at bedside decline surgical consultation. She said in the past each time they have done I & D on her hidradenitis suppurativa wounds  they have gotten worse post surgery. The wounds are open and draining with foul smell. Obtain blood and wound cultures to help guide antibiotic therapy. Unable to give significant amount of IV fluid boluses as patient is hemodialysis dependent. Patient apparently was previously evaluated at [facility] for possible surgical correction of her recurrent hydradenitis suppurativa she was told they were doing no good surgical options  2)ESRD hemodialysis dependent on Mondays Wednesdays and Fridays, nephrology consult requested for hemodialysis, use midodrine on hemodialysis days as per patient is prone to hypotension  3)DM- Restart glipizide., Use Novolog/Humalog Sliding scale insulin with Accu-Cheks/Fingersticks as ordered   4) anemia of CKD-no evidence of ongoing bleeding, erythropoietin replacing agent as per nephrology team   With History of - Reviewed by me  Past Medical  History:  Diagnosis Date  . Chronic diastolic CHF (congestive heart failure) ([place])   . Chronic kidney disease (CKD), stage III (moderate)    [name] nephrology lov note 05-12-2013 on pt chart now on HD  . DCM (dilated cardiomyopathy) ([place])    nonischemic  . Depression   . DM type 2 (diabetes mellitus, type 2) ([facility])    diet controlledwith retinopathy and nephropathy  . GERD (gastroesophageal reflux disease)   . H/O cardiac arrest 10/2012  . Hard of hearing 10/2012   now wears 2 hearing aids  . History of hemodialysis dec 2013  . HTN (hypertension)   . Hydradenitis   . Hyperkalemia   . Hyperlipidemia   . Hypothyroidism   . Iron deficiency anemia   . Morbid obesity ([place])   . Multinodular goiter   . Pneumonia Nov 10, 2012  . Sleep apnea    no test yet per mother 10/22/13   . SOB (shortness of breath)    hx of   . Urinary tract infection    taking antibiotics for 3 days prior to surgery      Past Surgical History:  Procedure Laterality Date  . AV FISTULA PLACEMENT Left 07/25/2013   Procedure: ARTERIOVENOUS (AV) FISTULA CREATION ;  Surgeon: Rosetta Posner, MD;  Location: [place];  Service: Vascular;  Laterality: Left;  . CYSTOSCOPY W/ RETROGRADES  11/16/2012   Procedure: CYSTOSCOPY WITH RETROGRADE PYELOGRAM;  Surgeon: Reece Packer, MD;  Location: [facility];  Service: Urology;  Laterality: Bilateral;  CYSTOSCOPY,BILATERAL RETROGRADE PYELOGRAM/ REMOVAL LEFT URETERAL STENT/ FULGERATION BLADDER MUCOSA/ INSERTION RIGHT URETERAL STENT  . CYSTOSCOPY W/ URETERAL STENT PLACEMENT  05/28/2012   Procedure: CYSTOSCOPY WITH RETROGRADE PYELOGRAM/URETERAL STENT PLACEMENT;  Surgeon: Reece Packer, MD;  Location: [facility];  Service: Urology;  Laterality: Left;  . CYSTOSCOPY WITH RETROGRADE PYELOGRAM, URETEROSCOPY AND STENT PLACEMENT Right 06/23/2013   Procedure: CYSTOSCOPY WITH RIGHT URETEROSCOPY, RIGHT  RETROGRADE PYELOGRAM, WITH LASER LIPOTRIPSY AND RIGHT URETERAL STENT EXCHANGE ;  Surgeon: Molli Hazard, MD;  Location: [facility];  Service: Urology;  Laterality: Right;  STENT EXCHANGE    . CYSTOSCOPY WITH RETROGRADE PYELOGRAM, URETEROSCOPY AND STENT PLACEMENT Right 11/06/2013   Procedure: CYSTOSCOPY WITH RETROGRADE PYELOGRAM, URETEROSCOPY STONE REMOVAL AND STENT REMOVAL;  Surgeon: Molli Hazard, MD;  Location: [facility];  Service: Urology;  Laterality: Right;  . HOLMIUM LASER APPLICATION N/A 6/75/9163   Procedure: HOLMIUM LASER APPLICATION;  Surgeon: Molli Hazard, MD;  Location: [facility];  Service: Urology;  Laterality: N/A;  . INSERTION OF DIALYSIS CATHETER N/A 09/24/2013   Procedure: INSERTION OF DIALYSIS CATHETER;  Surgeon: Rosetta Posner, MD;  Location: [place];  Service: Vascular;  Laterality: N/A;  . PORT-A-CATH REMOVAL    . PORTACATH PLACEMENT    . REVISON OF ARTERIOVENOUS FISTULA Left 07/25/2016   Procedure: REVISON OF LEFT ARTERIOVENOUS FISTULA;  Surgeon: Elam Dutch, MD;  Location: [place];  Service: Vascular;  Laterality: Left;      Chief Complaint  Patient presents with  . Weakness      HPI:    [name]  is a 55 y.o. female, With past medical history relevant for morbid obesity, diabetes mellitus and end-stage renal disease hemodialysis dependent on Mondays Wednesdays and Fridays who has recurrent hydradenitis suppurativa lesions with previous I&D and surgical intervention in the past without resolution. Patient now presents complaining of feeling unwell and with increased drainage from her skin lesions. No vomiting or diarrhea. Patient's husband and mother at bedside, questions answered. Denies chest pains or abdominal pains or shortness of breath   In ED- patient found to have fever of 102.2, with leukocytosis and low blood pressure as well as elevated lactic acid. Patient receive IV vancomycin ED, when the blood cultures were requested. Patient declined surgical consult. Awaiting hemodialysis today    Review of systems:    In addition to the  HPI above,   A full 12 point Review of 10 Systems was done, except as stated above, all other Review of 10 Systems were negative.    Social History:  Reviewed by me    Social History  Substance Use Topics  . Smoking status: Former Smoker    Packs/day: 0.25    Years: 1.00    Types: Cigarettes    Quit date: 11/28/1979  . Smokeless tobacco: Never Used  . Alcohol use No     Family History :  Reviewed by me    Family History  Problem Relation Age of Onset  . Coronary artery disease Mother   . Hypertension Mother   . Diabetes type II Mother   . Coronary artery disease Father   . Hypertension Father   . Malignant hyperthermia Father   . Cancer Maternal Grandfather     ?  Type  . Kidney failure Maternal Grandmother      Home Medications:   Prior to Admission medications   Medication Sig Start Date End Date Taking? Authorizing Provider  aspirin EC 81 MG tablet Take 81 mg by mouth daily.   Yes Historical Provider, MD  BYSTOLIC 2.5 MG tablet Take 2.5 mg by mouth daily.  07/05/16  Yes Historical Provider, MD  Calcium Acetate 667 MG TABS Take 2,001 mg by mouth 3 (three) times daily with meals.    Yes Historical Provider, MD  cephALEXin (KEFLEX) 500 MG capsule Take 500 mg by mouth 3 (three) times daily.  06/15/16  Yes Historical Provider, MD  clindamycin (CLEOCIN T) 1 % external solution Apply 1 application topically 2 (two) times daily. TO THE AFFECTED AREAS 09/07/15  Yes Historical Provider, MD  DULoxetine (CYMBALTA) 60 MG capsule Take 1 capsule (60 mg total) by mouth daily. 12/03/12  Yes Janece Canterbury, MD  famotidine (PEPCID) 20 MG tablet TAKE 1 TABLET BY MOUTH AT BEDTIME Patient taking differently: Take 20 mg by mouth at bedtime 10/15/14  Yes Tanda Rockers, MD  feeding supplement, RESOURCE BREEZE, (RESOURCE BREEZE) LIQD Take 1 Container by mouth 3 (three) times daily with meals.   Yes Historical Provider, MD  gabapentin (NEURONTIN) 100 MG capsule Take 100 mg by mouth at bedtime.   06/15/16  Yes Historical Provider, MD  glipiZIDE (GLUCOTROL) 5 MG tablet Take 2.5 mg by mouth daily. 06/12/16  Yes Historical Provider, MD  isosorbide mononitrate (IMDUR) 30 MG 24 hr tablet Take 30 mg by mouth every morning.   Yes Historical Provider, MD  levothyroxine (SYNTHROID, LEVOTHROID) 75 MCG tablet Take 75 mcg by mouth daily. 08/17/15  Yes Historical Provider, MD  midodrine (PROAMATINE) 10 MG tablet Take 10 mg by mouth at bedtime.  07/06/16  Yes Historical Provider, MD  multivitamin (RENA-VIT) TABS tablet Take 1 tablet by mouth daily. 08/16/15  Yes Historical Provider, MD  oxyCODONE-acetaminophen (PERCOCET/ROXICET) 5-325 MG tablet Take 1-2 tablets by mouth every 6 (six) hours as needed. Patient has not taken since started dialysis 08/2013 due to causes hypotension Patient taking differently: Take 1-2 tablets by mouth every 6 (six) hours as needed for moderate pain.  07/25/16  Yes Alvia Grove, PA-C  pravastatin (PRAVACHOL) 40 MG tablet Take 40 mg by mouth daily. Patient takes in am 12/03/12  Yes Janece Canterbury, MD  Probiotic Product (PROBIOTIC DAILY PO) Take 1 tablet by mouth daily.    Yes Historical Provider, MD  SENSIPAR 60 MG tablet Take 60 mg by mouth daily.  08/17/15  Yes Historical Provider, MD  sucroferric oxyhydroxide (VELPHORO) 500 MG chewable tablet Chew 500 mg by mouth 3 (three) times daily with meals.   Yes Historical Provider, MD  cyclobenzaprine (FLEXERIL) 10 MG tablet Take 1 tablet (10 mg total) by mouth 3 (three) times daily as needed for muscle spasms. For Spasms Patient not taking: Reported on 08/25/2016 12/03/12   Janece Canterbury, MD     Allergies:     Allergies  Allergen Reactions  . Rosiglitazone Maleate Swelling and Other (See Comments)    SWELLING REACTION UNSPECIFIED   . Amoxicillin Rash and Other (See Comments)    Tolerated Zosyn 01/2013. [place]  . Amoxicillin-Pot Clavulanate Diarrhea     Physical Exam:   Vitals  Blood pressure (!) 85/44, pulse 83,  temperature 99.4 F (37.4 C), temperature source Oral, resp. rate 21, height 5\' 6"  (1.676 m), weight 136.1 kg (300 lb), last menstrual period 06/13/2013, SpO2  99 %.  Physical Examination: General appearance - alert, Morbidly obese appearing, and in no distress and  Mental status - alert, oriented to person, place, and time,  Ears-hard of hearing, uses hearing aids Eyes - sclera anicteric Neck - supple, no JVD elevation , Chest - clear  to auscultation bilaterally, symmetrical air movement,  Heart - S1 and S2 normal,  Abdomen - soft, nontender, significantly increased truncal adiposity Neurological - screening mental status exam normal, neck supple without rigidity, cranial nerves II through XII intact, DTR's normal and symmetric Extremities - left arm AV fistula with positive atrium bruit, intact peripheral pulses  Skin - buttocks,  upper Thigh,groin/inguinal and lower abdominal areas with extensive hidradenitis suppurativa lesions with foul-smelling drainage, there is erythema and tenderness    Data Review:    CBC  Recent Labs Lab 08/25/16 1131  WBC 17.9*  HGB 9.8*  HCT 33.8*  PLT 447*  MCV 101.2*  MCH 29.3  MCHC 29.0*  RDW 18.3*  LYMPHSABS 0.7  MONOABS 0.8  EOSABS 0.1  BASOSABS 0.0   ------------------------------------------------------------------------------------------------------------------  Chemistries   Recent Labs Lab 08/25/16 1131  NA 134*  K 3.3*  CL 98*  CO2 21*  GLUCOSE 187*  BUN 54*  CREATININE 8.74*  CALCIUM 8.9   ------------------------------------------------------------------------------------------------------------------ estimated creatinine clearance is 10.5 mL/min (by C-G formula based on SCr of 8.74 mg/dL (H)). ------------------------------------------------------------------------------------------------------------------ No results for input(s): TSH, T4TOTAL, T3FREE, THYROIDAB in the last 72 hours.  Invalid input(s):  FREET3   Coagulation profile No results for input(s): INR, PROTIME in the last 168 hours. ------------------------------------------------------------------------------------------------------------------- No results for input(s): DDIMER in the last 72 hours. -------------------------------------------------------------------------------------------------------------------  Cardiac Enzymes No results for input(s): CKMB, TROPONINI, MYOGLOBIN in the last 168 hours.  Invalid input(s): CK ------------------------------------------------------------------------------------------------------------------ No results found for: BNP   ---------------------------------------------------------------------------------------------------------------  Urinalysis    Component Value Date/Time   COLORURINE YELLOW 07/18/2013 0831   APPEARANCEUR CLOUDY (A) 07/18/2013 0831   LABSPEC 1.012 07/18/2013 0831   PHURINE 5.0 07/18/2013 0831   GLUCOSEU NEGATIVE 07/18/2013 0831   HGBUR SMALL (A) 07/18/2013 0831   BILIRUBINUR NEGATIVE 07/18/2013 0831   KETONESUR NEGATIVE 07/18/2013 0831   PROTEINUR 30 (A) 07/18/2013 0831   UROBILINOGEN 0.2 07/18/2013 0831   NITRITE NEGATIVE 07/18/2013 0831   LEUKOCYTESUR MODERATE (A) 07/18/2013 0831    ----------------------------------------------------------------------------------------------------------------   Imaging Results:    Dg Chest 1 View  Result Date: 08/25/2016 CLINICAL DATA:  Shortness of breath for few days EXAM: CHEST 1 VIEW COMPARISON:  04/30/2015 FINDINGS: Cardiomegaly with progression from prior exam. Central mild vascular congestion and mild interstitial prominence bilateral suspicious for mild interstitial edema. Elevation of the right hemidiaphragm. Bilateral basilar atelectasis. No segmental infiltrate. IMPRESSION: Central mild vascular congestion and mild interstitial prominence bilateral suspicious for mild interstitial edema. Elevation of the  right hemidiaphragm. Bilateral basilar atelectasis. No segmental infiltrate. Electronically Signed   By: Lahoma Crocker M.D.   On: 08/25/2016 13:39    Radiological Exams on Admission: Dg Chest 1 View  Result Date: 08/25/2016 CLINICAL DATA:  Shortness of breath for few days EXAM: CHEST 1 VIEW COMPARISON:  04/30/2015 FINDINGS: Cardiomegaly with progression from prior exam. Central mild vascular congestion and mild interstitial prominence bilateral suspicious for mild interstitial edema. Elevation of the right hemidiaphragm. Bilateral basilar atelectasis. No segmental infiltrate. IMPRESSION: Central mild vascular congestion and mild interstitial prominence bilateral suspicious for mild interstitial edema. Elevation of the right hemidiaphragm. Bilateral basilar atelectasis. No segmental infiltrate. Electronically Signed   By: Orlean Bradford.D.  On: 08/25/2016 13:39    DVT Prophylaxis -SCD   AM Labs Ordered, also please review Full Orders  Family Communication: Admission, patients condition and plan of care including tests being ordered have been discussed with the patient and she has husband and her mother at bedside who indicate understanding and agree with the plan   Code Status - Full Code  Likely DC to  home  Condition   Blanchard Mane, Cherre Kothari M.D on 08/25/2016 at 6:26 PM   Between 7am to 7pm - Pager - [phone]  After 7pm go to [email or web address] - password [email or web address]  [facility] - Office  [phone]  Dragon dictation system was used to create this note, attempts have been made to correct errors, however presence of uncorrected errors is not a reflection quality of care provided.

## 2016-08-25 NOTE — ED Triage Notes (Signed)
Patient states shortness of breath and weakness that started this morning.   Patient states on O2  2L all the time.   Patient is alert and oriented, but states "i feel foggy".   Patient states she has "some kind of infection on my backside".  Patient states overall "just doesn't feel good".  Dailysis MWF with last dialysis on Wednesday.

## 2016-08-25 NOTE — ED Notes (Signed)
Attempted to obtain blood culture x 1. Unsuccessful. Patient states she is tired of being stuck. Called lab again to have someone come over to draw blood cultures.

## 2016-08-25 NOTE — ED Notes (Signed)
Spoke with Dr. Vanita Panda, he advised to hold off on orders and he will put them in once he sees her.   Blood drawn - waiting on orders to send.  Earley Brooke, RN.

## 2016-08-26 DIAGNOSIS — Z992 Dependence on renal dialysis: Secondary | ICD-10-CM | POA: Diagnosis not present

## 2016-08-26 DIAGNOSIS — E1129 Type 2 diabetes mellitus with other diabetic kidney complication: Secondary | ICD-10-CM | POA: Diagnosis not present

## 2016-08-26 DIAGNOSIS — N186 End stage renal disease: Secondary | ICD-10-CM | POA: Diagnosis not present

## 2016-08-26 LAB — BASIC METABOLIC PANEL
ANION GAP: 13 (ref 5–15)
BUN: 66 mg/dL — ABNORMAL HIGH (ref 6–20)
CHLORIDE: 98 mmol/L — AB (ref 101–111)
CO2: 21 mmol/L — AB (ref 22–32)
Calcium: 8 mg/dL — ABNORMAL LOW (ref 8.9–10.3)
Creatinine, Ser: 10.42 mg/dL — ABNORMAL HIGH (ref 0.44–1.00)
GFR calc Af Amer: 4 mL/min — ABNORMAL LOW (ref >60)
GFR calc non Af Amer: 4 mL/min — ABNORMAL LOW (ref >60)
GLUCOSE: 184 mg/dL — AB (ref 65–99)
POTASSIUM: 3.6 mmol/L (ref 3.5–5.1)
SODIUM: 132 mmol/L — AB (ref 135–145)

## 2016-08-26 LAB — GLUCOSE, CAPILLARY
GLUCOSE-CAPILLARY: 189 mg/dL — AB (ref 65–99)
Glucose-Capillary: 175 mg/dL — ABNORMAL HIGH (ref 65–99)
Glucose-Capillary: 168 mg/dL — ABNORMAL HIGH (ref 65–99)
Glucose-Capillary: 242 mg/dL — ABNORMAL HIGH (ref 65–99)

## 2016-08-26 LAB — BLOOD CULTURE ID PANEL (REFLEXED)
ACINETOBACTER BAUMANNII: NOT DETECTED
CANDIDA ALBICANS: NOT DETECTED
CANDIDA GLABRATA: NOT DETECTED
CANDIDA KRUSEI: NOT DETECTED
CANDIDA PARAPSILOSIS: NOT DETECTED
Candida tropicalis: NOT DETECTED
ENTEROBACTERIACEAE SPECIES: NOT DETECTED
ENTEROCOCCUS SPECIES: NOT DETECTED
ESCHERICHIA COLI: NOT DETECTED
Enterobacter cloacae complex: NOT DETECTED
Haemophilus influenzae: NOT DETECTED
KLEBSIELLA OXYTOCA: NOT DETECTED
Klebsiella pneumoniae: NOT DETECTED
LISTERIA MONOCYTOGENES: NOT DETECTED
Neisseria meningitidis: NOT DETECTED
Proteus species: NOT DETECTED
Pseudomonas aeruginosa: NOT DETECTED
STREPTOCOCCUS PYOGENES: NOT DETECTED
Serratia marcescens: NOT DETECTED
Staphylococcus aureus (BCID): NOT DETECTED
Staphylococcus species: NOT DETECTED
Streptococcus agalactiae: NOT DETECTED
Streptococcus pneumoniae: NOT DETECTED
Streptococcus species: DETECTED — AB

## 2016-08-26 LAB — CBC
HEMATOCRIT: 27.9 % — AB (ref 36.0–46.0)
HEMOGLOBIN: 8.1 g/dL — AB (ref 12.0–15.0)
MCH: 29.1 pg (ref 26.0–34.0)
MCHC: 29 g/dL — AB (ref 30.0–36.0)
MCV: 100.4 fL — ABNORMAL HIGH (ref 78.0–100.0)
Platelets: 356 10*3/uL (ref 150–400)
RBC: 2.78 MIL/uL — AB (ref 3.87–5.11)
RDW: 18.3 % — ABNORMAL HIGH (ref 11.5–15.5)
WBC: 19.3 10*3/uL — ABNORMAL HIGH (ref 4.0–10.5)

## 2016-08-26 LAB — LACTIC ACID, PLASMA
LACTIC ACID, VENOUS: 1 mmol/L (ref 0.5–1.9)
Lactic Acid, Venous: 0.9 mmol/L (ref 0.5–1.9)

## 2016-08-26 LAB — MRSA PCR SCREENING: MRSA by PCR: NEGATIVE

## 2016-08-26 MED ORDER — MIDODRINE HCL 5 MG PO TABS
ORAL_TABLET | ORAL | Status: AC
  Filled 2016-08-26: qty 2

## 2016-08-26 MED ORDER — ACETAMINOPHEN 325 MG PO TABS
ORAL_TABLET | ORAL | Status: AC
  Filled 2016-08-26: qty 2

## 2016-08-26 MED ORDER — CALCITRIOL 0.5 MCG PO CAPS
ORAL_CAPSULE | ORAL | Status: AC
  Administered 2016-08-26: 1.5 ug
  Filled 2016-08-26: qty 3

## 2016-08-26 MED ORDER — DEXTROSE 5 % IV SOLN
2.0000 g | INTRAVENOUS | Status: DC
  Administered 2016-08-26 – 2016-08-31 (×6): 2 g via INTRAVENOUS
  Filled 2016-08-26 (×7): qty 2

## 2016-08-26 NOTE — Progress Notes (Signed)
PROGRESS NOTE  [name] UDJ:[id number] DOB: 1961-08-17 DOA: 08/25/2016 PCP: [name], MD  Hospital Course/Subjective: 55 yo AAF with ESRD on HD and recurrent infected hidradenitis with purulent discharge, lactic acidosis admitted 9/30. She was placed on empiric vancomycin and has refused surgical evaluation as she says she's had worsened lesions every time she has undergone surgery. Due to positive blood cultures, antibiotics were changed 9/30 to IV Rocephin. She was seen on HD this AM, she has hypotension and says she feels a bit 'foggy' this AM. Denies chest pain, nausea, vomiting or fevers overnight.   Assessment/Plan: Sepsis - due to infected hidradenitis. Continue present care with IV fluids (with caution due to HD dependence), empiric IV abx. Follow blood cultures. Has been afebrile. Will recheck lactate this AM and bolus fluids if still elevated.  Hidradenitis - treating with IV abx as above. Patient noted to have refused surgery evaluation. Will consult wound care, if not improving may need imaging or may need to insist on surgical consultation.  ESRD on HD - quite low BP today, she does have history of hypotension on HD and is on midodrine for this. Concerned this may also be a sign of continued sepsis. May need to bolus fluids later today.  DM - continue home glipizide, SSI, DM diet and check A1c.  Anemia of CKD - stable, monitor daily  DVT Prophylaxis: Heparin SQ  Code Status: FULL   Family Communication: None present.   Disposition Plan: Back home in 2-4 days.   Consultants:  Renal for HD   Procedures:  None   Antimicrobials:  Vancomycin 9/29-9/30  Rocephin 9/30-->    Objective: Vitals:   08/26/16 0900 08/26/16 0930 08/26/16 1000 08/26/16 1015  BP: (!) 83/43 (!) 88/49 (!) 79/42 (!) 74/44  Pulse: 95 95 93 94  Resp: (!) 21 (!) 22 (!) 21 (!) 22  Temp:      TempSrc:      SpO2:      Weight:      Height:        Intake/Output Summary (Last 24  hours) at 08/26/16 1118 Last data filed at 08/26/16 0930  Gross per 24 hour  Intake              200 ml  Output                0 ml  Net              200 ml   Filed Weights   08/25/16 1838 08/25/16 2055 08/26/16 0758  Weight: (!) 160 kg (352 lb 11.8 oz) (!) 157.5 kg (347 lb 3.6 oz) (!) 167.3 kg (368 lb 13.3 oz)     Exam: General:  Alert, oriented, calm, in no acute distress, hard of hearing Neck: supple, no masses, trachea mildline  Cardiovascular: RRR, no murmurs or rubs, no peripheral edema  Respiratory: clear to auscultation bilaterally, no wheezes, no crackles  Abdomen: soft, nontender, nondistended, normal bowel tones heard, obese Skin: dry, no rashes (groin and buttock not examined since on HD) Musculoskeletal: no joint effusions, normal range of motion  Psychiatric: appropriate affect, normal speech  Neurologic: extraocular muscles intact, clear speech, moving all extremities with intact sensorium    Data Reviewed: CBC:  Recent Labs Lab 08/25/16 1131 08/25/16 2024 08/26/16 0845  WBC 17.9* 14.9* 19.3*  NEUTROABS 16.3*  --   --   HGB 9.8* 8.6* 8.1*  HCT 33.8* 29.9* 27.9*  MCV 101.2* 101.7* 100.4*  PLT  447* 376 324   Basic Metabolic Panel:  Recent Labs Lab 08/25/16 1131 08/25/16 2024 08/26/16 0845  NA 134* 135 132*  K 3.3* 3.4* 3.6  CL 98* 99* 98*  CO2 21* 23 21*  GLUCOSE 187* 141* 184*  BUN 54* 60* 66*  CREATININE 8.74* 9.55* 10.42*  CALCIUM 8.9 8.3* 8.0*  PHOS  --  1.7*  --    GFR: Estimated Creatinine Clearance: 10 mL/min (by C-G formula based on SCr of 10.42 mg/dL (H)). Liver Function Tests:  Recent Labs Lab 08/25/16 2024  ALBUMIN 2.5*   No results for input(s): LIPASE, AMYLASE in the last 168 hours. No results for input(s): AMMONIA in the last 168 hours. Coagulation Profile: No results for input(s): INR, PROTIME in the last 168 hours. Cardiac Enzymes: No results for input(s): CKTOTAL, CKMB, CKMBINDEX, TROPONINI in the last 168  hours. BNP (last 3 results) No results for input(s): PROBNP in the last 8760 hours. HbA1C: No results for input(s): HGBA1C in the last 72 hours. CBG:  Recent Labs Lab 08/25/16 2053 08/26/16 0739  GLUCAP 149* 175*   Lipid Profile: No results for input(s): CHOL, HDL, LDLCALC, TRIG, CHOLHDL, LDLDIRECT in the last 72 hours. Thyroid Function Tests: No results for input(s): TSH, T4TOTAL, FREET4, T3FREE, THYROIDAB in the last 72 hours. Anemia Panel: No results for input(s): VITAMINB12, FOLATE, FERRITIN, TIBC, IRON, RETICCTPCT in the last 72 hours. Urine analysis:    Component Value Date/Time   COLORURINE YELLOW 07/18/2013 0831   APPEARANCEUR CLOUDY (A) 07/18/2013 0831   LABSPEC 1.012 07/18/2013 0831   PHURINE 5.0 07/18/2013 0831   GLUCOSEU NEGATIVE 07/18/2013 0831   HGBUR SMALL (A) 07/18/2013 0831   BILIRUBINUR NEGATIVE 07/18/2013 0831   KETONESUR NEGATIVE 07/18/2013 0831   PROTEINUR 30 (A) 07/18/2013 0831   UROBILINOGEN 0.2 07/18/2013 0831   NITRITE NEGATIVE 07/18/2013 0831   LEUKOCYTESUR MODERATE (A) 07/18/2013 0831   Sepsis Labs: @LABRCNTIP (procalcitonin:4,lacticidven:4)  ) Recent Results (from the past 240 hour(s))  Aerobic/Anaerobic Culture (surgical/deep wound)     Status: None (Preliminary result)   Collection Time: 08/25/16  1:53 PM  Result Value Ref Range Status   Specimen Description BUTTOCKS  Final   Special Requests Immunocompromised  Final   Gram Stain   Final    FEW WBC PRESENT, PREDOMINANTLY PMN ABUNDANT GRAM POSITIVE COCCI IN PAIRS IN CLUSTERS ABUNDANT GRAM NEGATIVE RODS MODERATE GRAM VARIABLE ROD    Culture PENDING  Incomplete   Report Status PENDING  Incomplete  Blood culture (routine x 2)     Status: None (Preliminary result)   Collection Time: 08/25/16  2:35 PM  Result Value Ref Range Status   Specimen Description BLOOD RIGHT ARM  Final   Special Requests BOTTLES DRAWN AEROBIC AND ANAEROBIC 5CC  Final   Culture  Setup Time   Final    GRAM  POSITIVE COCCI IN CHAINS ANAEROBIC BOTTLE ONLY CRITICAL RESULT CALLED TO, READ BACK BY AND VERIFIED WITH: T STONE,PHARMD AT 1003 08/26/16 BY L BENFIELD    Culture GRAM POSITIVE COCCI  Final   Report Status PENDING  Incomplete  Blood Culture ID Panel (Reflexed)     Status: Abnormal   Collection Time: 08/25/16  2:35 PM  Result Value Ref Range Status   Enterococcus species NOT DETECTED NOT DETECTED Final   Listeria monocytogenes NOT DETECTED NOT DETECTED Final   Staphylococcus species NOT DETECTED NOT DETECTED Final   Staphylococcus aureus NOT DETECTED NOT DETECTED Final   Streptococcus species DETECTED (A) NOT DETECTED Final  Comment: CRITICAL RESULT CALLED TO, READ BACK BY AND VERIFIED WITH: T STONE,PHARMD AT 1003 08/26/16 BY L BENFIELD    Streptococcus agalactiae NOT DETECTED NOT DETECTED Final   Streptococcus pneumoniae NOT DETECTED NOT DETECTED Final   Streptococcus pyogenes NOT DETECTED NOT DETECTED Final   Acinetobacter baumannii NOT DETECTED NOT DETECTED Final   Enterobacteriaceae species NOT DETECTED NOT DETECTED Final   Enterobacter cloacae complex NOT DETECTED NOT DETECTED Final   Escherichia coli NOT DETECTED NOT DETECTED Final   Klebsiella oxytoca NOT DETECTED NOT DETECTED Final   Klebsiella pneumoniae NOT DETECTED NOT DETECTED Final   Proteus species NOT DETECTED NOT DETECTED Final   Serratia marcescens NOT DETECTED NOT DETECTED Final   Haemophilus influenzae NOT DETECTED NOT DETECTED Final   Neisseria meningitidis NOT DETECTED NOT DETECTED Final   Pseudomonas aeruginosa NOT DETECTED NOT DETECTED Final   Candida albicans NOT DETECTED NOT DETECTED Final   Candida glabrata NOT DETECTED NOT DETECTED Final   Candida krusei NOT DETECTED NOT DETECTED Final   Candida parapsilosis NOT DETECTED NOT DETECTED Final   Candida tropicalis NOT DETECTED NOT DETECTED Final  Blood culture (routine x 2)     Status: None (Preliminary result)   Collection Time: 08/25/16  2:40 PM  Result  Value Ref Range Status   Specimen Description BLOOD RIGHT HAND  Final   Special Requests BOTTLES DRAWN AEROBIC AND ANAEROBIC 5CC  Final   Culture  Setup Time   Final    GRAM POSITIVE COCCI IN CHAINS ANAEROBIC BOTTLE ONLY    Culture PENDING  Incomplete   Report Status PENDING  Incomplete  MRSA PCR Screening     Status: None   Collection Time: 08/26/16  1:56 AM  Result Value Ref Range Status   MRSA by PCR NEGATIVE NEGATIVE Final    Comment:        The GeneXpert MRSA Assay (FDA approved for NASAL specimens only), is one component of a comprehensive MRSA colonization surveillance program. It is not intended to diagnose MRSA infection nor to guide or monitor treatment for MRSA infections.      Studies: Dg Chest 1 View  Result Date: 08/25/2016 CLINICAL DATA:  Shortness of breath for few days EXAM: CHEST 1 VIEW COMPARISON:  04/30/2015 FINDINGS: Cardiomegaly with progression from prior exam. Central mild vascular congestion and mild interstitial prominence bilateral suspicious for mild interstitial edema. Elevation of the right hemidiaphragm. Bilateral basilar atelectasis. No segmental infiltrate. IMPRESSION: Central mild vascular congestion and mild interstitial prominence bilateral suspicious for mild interstitial edema. Elevation of the right hemidiaphragm. Bilateral basilar atelectasis. No segmental infiltrate. Electronically Signed   By: Lahoma Crocker M.D.   On: 08/25/2016 13:39    Scheduled Meds: . acidophilus   Oral Daily  . aspirin EC  81 mg Oral Daily  . calcitRIOL      . calcitRIOL  1.5 mcg Oral Q T,Th,Sa-HD  . calcium acetate  2,001 mg Oral TID WC  . cefTRIAXone (ROCEPHIN)  IV  2 g Intravenous Q24H  . cinacalcet  60 mg Oral Daily  . DULoxetine  60 mg Oral Daily  . famotidine  20 mg Oral QHS  . feeding supplement  1 Container Oral TID WC  . gabapentin  100 mg Oral QHS  . glipiZIDE  2.5 mg Oral Q breakfast  . heparin  5,000 Units Subcutaneous Q8H  . insulin aspart  0-9  Units Subcutaneous TID WC  . levothyroxine  75 mcg Oral QAC breakfast  . midodrine  10 mg  Oral TID WC  . multivitamin  1 tablet Oral Daily  . nebivolol  2.5 mg Oral Daily  . pravastatin  40 mg Oral Daily  . senna  1 tablet Oral BID  . sodium chloride flush  3 mL Intravenous Q12H  . sodium chloride flush  3 mL Intravenous Q12H  . sucroferric oxyhydroxide  500 mg Oral TID WC    Continuous Infusions:    LOS: 1 day   Time spent: 35 minutes  Prophet Renwick Marry Guan, MD [facility] Pager [phone]  If 7PM-7AM, please contact night-coverage [email or web address] Password [email or web address] 08/26/2016, 11:18 AM

## 2016-08-26 NOTE — Progress Notes (Signed)
PHARMACY - PHYSICIAN COMMUNICATION CRITICAL VALUE ALERT - BLOOD CULTURE IDENTIFICATION (BCID)  Results for orders placed or performed during the hospital encounter of 08/25/16  Blood Culture ID Panel (Reflexed) (Collected: 08/25/2016  2:35 PM)  Result Value Ref Range   Enterococcus species NOT DETECTED NOT DETECTED   Listeria monocytogenes NOT DETECTED NOT DETECTED   Staphylococcus species NOT DETECTED NOT DETECTED   Staphylococcus aureus NOT DETECTED NOT DETECTED   Streptococcus species DETECTED (A) NOT DETECTED   Streptococcus agalactiae NOT DETECTED NOT DETECTED   Streptococcus pneumoniae NOT DETECTED NOT DETECTED   Streptococcus pyogenes NOT DETECTED NOT DETECTED   Acinetobacter baumannii NOT DETECTED NOT DETECTED   Enterobacteriaceae species NOT DETECTED NOT DETECTED   Enterobacter cloacae complex NOT DETECTED NOT DETECTED   Escherichia coli NOT DETECTED NOT DETECTED   Klebsiella oxytoca NOT DETECTED NOT DETECTED   Klebsiella pneumoniae NOT DETECTED NOT DETECTED   Proteus species NOT DETECTED NOT DETECTED   Serratia marcescens NOT DETECTED NOT DETECTED   Haemophilus influenzae NOT DETECTED NOT DETECTED   Neisseria meningitidis NOT DETECTED NOT DETECTED   Pseudomonas aeruginosa NOT DETECTED NOT DETECTED   Candida albicans NOT DETECTED NOT DETECTED   Candida glabrata NOT DETECTED NOT DETECTED   Candida krusei NOT DETECTED NOT DETECTED   Candida parapsilosis NOT DETECTED NOT DETECTED   Candida tropicalis NOT DETECTED NOT DETECTED    Name of physician (or Provider) Contacted: Dr. Hollice Gong  Changes to prescribed antibiotics required: Pt currently on clindamycin, d/c clindamycin and start ceftriaxone 2g IV q24h. Patient also with abundant GNR and GPC in pairs/clusters on buttocks gram stain. Discussed with provider.  Dimitri Ped, PharmD. PGY-2 Infectious Diseases Pharmacy Resident Pager: [phone] 08/26/2016, 10:08 AM

## 2016-08-26 NOTE — Progress Notes (Signed)
[facility] Progress Note   Assessment/ Plan:     Dialyzes at [facility]  EDW 162.5 kg HD 4 hrs 30 min Bath 2K / 2.5 Ca Dialyzer F200 BFR 500 Heparin 5000 bolus 5000 mid-run Access L AVG  Venofer 50 q weekly Mircera 225 mcg q 2 weeks last given 9/27 Calcitriol 1.50 mcg q rx  1 sepsis: 2/2 hidradenitis.  Getting vancomycin, clinda, (9/29-), ceftaz (9/29) Gram stain with abundant GNRs, GPCs pairs/ clusters, and gram variable rods; blood cx 9/29 with 1/2 GPCs in chains.   Blood pressures are improving 2 ESRD: on MWF.  providing HD 9/30 off schedule, will get back to regular schedule Monday 10/2 3 Hypotension: on midodrine 10 mg QHS according to mom but TID in chart.  Will give TID.  Expect them to improve somewhat with treatment of sepsis.  Volume removal today will be difficult in setting of hypotension.  Cannot get standing wt right now due to pain. 4. Anemia of ESRD: on Mircera 225 q 2 weeks (given 9/27) Hgb 9.5, no iron in setting of infection 5. 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 Calcitriol 1.50 mcg q dialysis 6.  Nutrition: albumin 3.0, will need prostat  Subjective:    Feeling less "foggy" this  AM .  Reports that her buttocks and groin area are hurting   Objective:   BP (!) 83/43   Pulse 95   Temp (!) 100.5 F (38.1 C) (Oral)   Resp (!) 21   Ht 5\' 6"  (1.676 m)   Wt (!) 157.5 kg (347 lb 3.6 oz)   LMP 06/13/2013   SpO2 100%   BMI 56.04 kg/m   Physical Exam:   GEN: chronically ill appearing, appears that she feels better today HEENT: hard of hearing, sclerae anicteric.  Hearing aids not in today NECK: no JVD PULM: distant breath sounds, no crackles, diffusely poor air movement CV : distant heart sounds, regular, NAD ABD: protuberant, obese, nontender EXT: 1+ LE edema, appears diffusely anasarcic SKIN: multiple erosions and lesions buttocks and groin with serous  drainage and odor   Labs: BMET  Recent Labs Lab 08/25/16 1131 08/25/16 2024  NA 134* 135  K 3.3* 3.4*  CL 98* 99*  CO2 21* 23  GLUCOSE 187* 141*  BUN 54* 60*  CREATININE 8.74* 9.55*  CALCIUM 8.9 8.3*  PHOS  --  1.7*   CBC  Recent Labs Lab 08/25/16 1131 08/25/16 2024 08/26/16 0845  WBC 17.9* 14.9* 19.3*  NEUTROABS 16.3*  --   --   HGB 9.8* 8.6* 8.1*  HCT 33.8* 29.9* 27.9*  MCV 101.2* 101.7* 100.4*  PLT 447* 376 356    @IMGRELPRIORS @ Medications:    . acidophilus   Oral Daily  . aspirin EC  81 mg Oral Daily  . calcitRIOL  1.5 mcg Oral Q T,Th,Sa-HD  . calcium acetate  2,001 mg Oral TID WC  . cinacalcet  60 mg Oral Daily  . clindamycin (CLEOCIN) IV  600 mg Intravenous Q8H  . DULoxetine  60 mg Oral Daily  . famotidine  20 mg Oral QHS  . feeding supplement  1 Container Oral TID WC  . gabapentin  100 mg Oral QHS  . glipiZIDE  2.5 mg Oral Q breakfast  . heparin  5,000 Units Subcutaneous Q8H  . insulin aspart  0-9 Units Subcutaneous TID WC  . levothyroxine  75 mcg  Oral QAC breakfast  . midodrine      . midodrine  10 mg Oral TID WC  . multivitamin  1 tablet Oral Daily  . nebivolol  2.5 mg Oral Daily  . pravastatin  40 mg Oral Daily  . senna  1 tablet Oral BID  . sodium chloride flush  3 mL Intravenous Q12H  . sodium chloride flush  3 mL Intravenous Q12H  . sucroferric oxyhydroxide  500 mg Oral TID WC     Madelon Lips MD [facility] Cell [phone] Pgr: [phone] 08/26/2016, 9:16 AM

## 2016-08-26 NOTE — Progress Notes (Signed)
Patient seen on Hemodialysis. QB 500 UF goal 1L if tolerated (BP s are dropping who ? If we will be able to do this). Treatment adjusted as needed.  Madelon Lips MD [facility]. Cell [phone] pgr : [phone] 9:26 AM

## 2016-08-26 NOTE — Progress Notes (Signed)
Assessment  Of wound by myself and Onalee Hua woc assoc, area bil groin,, pubic region,buttocks and thighs all areas with pink tan drainage  And foul odor.

## 2016-08-27 LAB — BASIC METABOLIC PANEL
Anion gap: 14 (ref 5–15)
BUN: 53 mg/dL — AB (ref 6–20)
CO2: 24 mmol/L (ref 22–32)
Calcium: 8.5 mg/dL — ABNORMAL LOW (ref 8.9–10.3)
Chloride: 99 mmol/L — ABNORMAL LOW (ref 101–111)
Creatinine, Ser: 9.54 mg/dL — ABNORMAL HIGH (ref 0.44–1.00)
GFR calc Af Amer: 5 mL/min — ABNORMAL LOW (ref >60)
GFR, EST NON AFRICAN AMERICAN: 4 mL/min — AB (ref >60)
GLUCOSE: 120 mg/dL — AB (ref 65–99)
POTASSIUM: 3.9 mmol/L (ref 3.5–5.1)
Sodium: 137 mmol/L (ref 135–145)

## 2016-08-27 LAB — CBC
HEMATOCRIT: 31.2 % — AB (ref 36.0–46.0)
HEMOGLOBIN: 8.8 g/dL — AB (ref 12.0–15.0)
MCH: 28.7 pg (ref 26.0–34.0)
MCHC: 28.2 g/dL — AB (ref 30.0–36.0)
MCV: 101.6 fL — ABNORMAL HIGH (ref 78.0–100.0)
PLATELETS: 366 10*3/uL (ref 150–400)
RBC: 3.07 MIL/uL — ABNORMAL LOW (ref 3.87–5.11)
RDW: 18.4 % — AB (ref 11.5–15.5)
WBC: 18.1 10*3/uL — ABNORMAL HIGH (ref 4.0–10.5)

## 2016-08-27 LAB — HEPATITIS B SURFACE ANTIGEN: HEP B S AG: NEGATIVE

## 2016-08-27 LAB — GLUCOSE, CAPILLARY
GLUCOSE-CAPILLARY: 111 mg/dL — AB (ref 65–99)
Glucose-Capillary: 155 mg/dL — ABNORMAL HIGH (ref 65–99)
Glucose-Capillary: 140 mg/dL — ABNORMAL HIGH (ref 65–99)
Glucose-Capillary: 216 mg/dL — ABNORMAL HIGH (ref 65–99)

## 2016-08-27 MED ORDER — CALCITRIOL 0.5 MCG PO CAPS
1.5000 ug | ORAL_CAPSULE | ORAL | Status: DC
  Administered 2016-08-28 – 2016-08-30 (×2): 1.5 ug via ORAL
  Filled 2016-08-27: qty 3

## 2016-08-27 MED ORDER — INFLUENZA VAC SPLIT QUAD 0.5 ML IM SUSY
0.5000 mL | PREFILLED_SYRINGE | INTRAMUSCULAR | Status: DC
  Filled 2016-08-27: qty 0.5

## 2016-08-27 NOTE — Progress Notes (Signed)
PROGRESS NOTE  [name] VWU:[id number] DOB: Nov 21, 1961 DOA: 08/25/2016 PCP: [name], MD  Hospital Course/Subjective: 55 yo AAF with ESRD on HD and recurrent infected hidradenitis with purulent discharge, lactic acidosis admitted 9/30. She was placed on empiric vancomycin and has refused surgical evaluation as she says she's had worsened lesions every time she has undergone surgery. Due to positive blood cultures, antibiotics were changed 9/30 to IV Rocephin. She was seen in her room this AM lying prone, no acute events, feeling a little better today. No fevers, nausea, pain controlled.  Assessment/Plan: Sepsis - due to infected hidradenitis. Continue present care with IV fluids (with caution due to HD dependence), empiric IV abx. Follow blood cultures. Has been afebrile. Note lactate 9/30 back to normal.  Hidradenitis - treating with IV abx as above. Patient noted to have refused surgery evaluation. Will consult wound care, if not improving may need imaging or may need to insist on surgical consultation. I will try to circle back and examine wounds with RN/WoundCare when dressings are changed later today.  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 Improved with improved sepsis physiology.  DM - continue home glipizide, SSI, DM diet and check A1c.  Anemia of CKD - stable, monitor daily  DVT Prophylaxis: Heparin SQ  Code Status: FULL   Family Communication: None present.   Disposition Plan: Back home in 2-4 days.   Consultants:  Renal for HD   Procedures:  None   Antimicrobials:  Vancomycin 9/29-9/30  Rocephin 9/30-->    Objective: Vitals:   08/26/16 1701 08/26/16 2145 08/27/16 0621 08/27/16 0929  BP: (!) 86/50 (!) 92/50 (!) 96/53 (!) 108/52  Pulse: 78 65 (!) 51 77  Resp: 19 18 17 16   Temp: 98.7 F (37.1 C) 98.7 F (37.1 C) 98.7 F (37.1 C) 98.2 F (36.8 C)  TempSrc: Oral Oral Oral Oral   SpO2: 100% 100% 100% 100%  Weight:  (!) 167.7 kg (369 lb 11.4 oz)    Height:        Intake/Output Summary (Last 24 hours) at 08/27/16 1019 Last data filed at 08/27/16 0929  Gross per 24 hour  Intake              947 ml  Output                0 ml  Net              947 ml   Filed Weights   08/26/16 0758 08/26/16 1240 08/26/16 2145  Weight: (!) 167.3 kg (368 lb 13.3 oz) (!) 167.3 kg (368 lb 13.3 oz) (!) 167.7 kg (369 lb 11.4 oz)     Exam: General:  Alert, oriented, calm, in no acute distress Neck: supple, no masses, trachea mildline  Cardiovascular: RRR, no murmurs or rubs, no peripheral edema  Respiratory: clear to auscultation bilaterally, no wheezes, no crackles  Abdomen: soft, nontender, nondistended, normal bowel tones heard, obese Skin: dry, no rashes (groin and buttock not examined since on HD) Musculoskeletal: no joint effusions, normal range of motion  Psychiatric: appropriate affect, normal speech  Neurologic: extraocular muscles intact, clear speech, moving all extremities with intact sensorium    Data Reviewed: CBC:  Recent Labs Lab 08/25/16 1131 08/25/16 2024 08/26/16 0845 08/27/16 0540  WBC 17.9* 14.9* 19.3* 18.1*  NEUTROABS 16.3*  --   --   --   HGB  9.8* 8.6* 8.1* 8.8*  HCT 33.8* 29.9* 27.9* 31.2*  MCV 101.2* 101.7* 100.4* 101.6*  PLT 447* 376 356 161   Basic Metabolic Panel:  Recent Labs Lab 08/25/16 1131 08/25/16 2024 08/26/16 0845 08/27/16 0540  NA 134* 135 132* 137  K 3.3* 3.4* 3.6 3.9  CL 98* 99* 98* 99*  CO2 21* 23 21* 24  GLUCOSE 187* 141* 184* 120*  BUN 54* 60* 66* 53*  CREATININE 8.74* 9.55* 10.42* 9.54*  CALCIUM 8.9 8.3* 8.0* 8.5*  PHOS  --  1.7*  --   --    GFR: Estimated Creatinine Clearance: 10.9 mL/min (by C-G formula based on SCr of 9.54 mg/dL (H)). Liver Function Tests:  Recent Labs Lab 08/25/16 2024  ALBUMIN 2.5*   No results for input(s): LIPASE, AMYLASE in the last 168 hours. No results for input(s): AMMONIA  in the last 168 hours. Coagulation Profile: No results for input(s): INR, PROTIME in the last 168 hours. Cardiac Enzymes: No results for input(s): CKTOTAL, CKMB, CKMBINDEX, TROPONINI in the last 168 hours. BNP (last 3 results) No results for input(s): PROBNP in the last 8760 hours. HbA1C: No results for input(s): HGBA1C in the last 72 hours. CBG:  Recent Labs Lab 08/26/16 0739 08/26/16 1301 08/26/16 1637 08/26/16 2134 08/27/16 0808  GLUCAP 175* 189* 168* 242* 111*   Lipid Profile: No results for input(s): CHOL, HDL, LDLCALC, TRIG, CHOLHDL, LDLDIRECT in the last 72 hours. Thyroid Function Tests: No results for input(s): TSH, T4TOTAL, FREET4, T3FREE, THYROIDAB in the last 72 hours. Anemia Panel: No results for input(s): VITAMINB12, FOLATE, FERRITIN, TIBC, IRON, RETICCTPCT in the last 72 hours. Urine analysis:    Component Value Date/Time   COLORURINE YELLOW 07/18/2013 0831   APPEARANCEUR CLOUDY (A) 07/18/2013 0831   LABSPEC 1.012 07/18/2013 0831   PHURINE 5.0 07/18/2013 0831   GLUCOSEU NEGATIVE 07/18/2013 0831   HGBUR SMALL (A) 07/18/2013 0831   BILIRUBINUR NEGATIVE 07/18/2013 0831   KETONESUR NEGATIVE 07/18/2013 0831   PROTEINUR 30 (A) 07/18/2013 0831   UROBILINOGEN 0.2 07/18/2013 0831   NITRITE NEGATIVE 07/18/2013 0831   LEUKOCYTESUR MODERATE (A) 07/18/2013 0831   Sepsis Labs: @LABRCNTIP (procalcitonin:4,lacticidven:4)  ) Recent Results (from the past 240 hour(s))  Aerobic/Anaerobic Culture (surgical/deep wound)     Status: None (Preliminary result)   Collection Time: 08/25/16  1:53 PM  Result Value Ref Range Status   Specimen Description BUTTOCKS  Final   Special Requests Immunocompromised  Final   Gram Stain   Final    FEW WBC PRESENT, PREDOMINANTLY PMN ABUNDANT GRAM POSITIVE COCCI IN PAIRS IN CLUSTERS ABUNDANT GRAM NEGATIVE RODS MODERATE GRAM VARIABLE ROD    Culture CULTURE REINCUBATED FOR BETTER GROWTH  Final   Report Status PENDING  Incomplete  Blood  culture (routine x 2)     Status: None (Preliminary result)   Collection Time: 08/25/16  2:35 PM  Result Value Ref Range Status   Specimen Description BLOOD RIGHT ARM  Final   Special Requests BOTTLES DRAWN AEROBIC AND ANAEROBIC 5CC  Final   Culture  Setup Time   Final    GRAM POSITIVE COCCI IN CHAINS ANAEROBIC BOTTLE ONLY CRITICAL RESULT CALLED TO, READ BACK BY AND VERIFIED WITH: T STONE,PHARMD AT 1003 08/26/16 BY L BENFIELD    Culture GRAM POSITIVE COCCI  Final   Report Status PENDING  Incomplete  Blood Culture ID Panel (Reflexed)     Status: Abnormal   Collection Time: 08/25/16  2:35 PM  Result Value Ref Range Status  Enterococcus species NOT DETECTED NOT DETECTED Final   Listeria monocytogenes NOT DETECTED NOT DETECTED Final   Staphylococcus species NOT DETECTED NOT DETECTED Final   Staphylococcus aureus NOT DETECTED NOT DETECTED Final   Streptococcus species DETECTED (A) NOT DETECTED Final    Comment: CRITICAL RESULT CALLED TO, READ BACK BY AND VERIFIED WITH: T STONE,PHARMD AT 1003 08/26/16 BY L BENFIELD    Streptococcus agalactiae NOT DETECTED NOT DETECTED Final   Streptococcus pneumoniae NOT DETECTED NOT DETECTED Final   Streptococcus pyogenes NOT DETECTED NOT DETECTED Final   Acinetobacter baumannii NOT DETECTED NOT DETECTED Final   Enterobacteriaceae species NOT DETECTED NOT DETECTED Final   Enterobacter cloacae complex NOT DETECTED NOT DETECTED Final   Escherichia coli NOT DETECTED NOT DETECTED Final   Klebsiella oxytoca NOT DETECTED NOT DETECTED Final   Klebsiella pneumoniae NOT DETECTED NOT DETECTED Final   Proteus species NOT DETECTED NOT DETECTED Final   Serratia marcescens NOT DETECTED NOT DETECTED Final   Haemophilus influenzae NOT DETECTED NOT DETECTED Final   Neisseria meningitidis NOT DETECTED NOT DETECTED Final   Pseudomonas aeruginosa NOT DETECTED NOT DETECTED Final   Candida albicans NOT DETECTED NOT DETECTED Final   Candida glabrata NOT DETECTED NOT  DETECTED Final   Candida krusei NOT DETECTED NOT DETECTED Final   Candida parapsilosis NOT DETECTED NOT DETECTED Final   Candida tropicalis NOT DETECTED NOT DETECTED Final  Blood culture (routine x 2)     Status: None (Preliminary result)   Collection Time: 08/25/16  2:40 PM  Result Value Ref Range Status   Specimen Description BLOOD RIGHT HAND  Final   Special Requests BOTTLES DRAWN AEROBIC AND ANAEROBIC 5CC  Final   Culture  Setup Time   Final    GRAM POSITIVE COCCI IN CHAINS ANAEROBIC BOTTLE ONLY CRITICAL VALUE NOTED.  VALUE IS CONSISTENT WITH PREVIOUSLY REPORTED AND CALLED VALUE.    Culture   Final    GRAM POSITIVE COCCI CULTURE REINCUBATED FOR BETTER GROWTH    Report Status PENDING  Incomplete  MRSA PCR Screening     Status: None   Collection Time: 08/26/16  1:56 AM  Result Value Ref Range Status   MRSA by PCR NEGATIVE NEGATIVE Final    Comment:        The GeneXpert MRSA Assay (FDA approved for NASAL specimens only), is one component of a comprehensive MRSA colonization surveillance program. It is not intended to diagnose MRSA infection nor to guide or monitor treatment for MRSA infections.      Studies: No results found.  Scheduled Meds: . acidophilus   Oral Daily  . aspirin EC  81 mg Oral Daily  . calcitRIOL  1.5 mcg Oral Q T,Th,Sa-HD  . cefTRIAXone (ROCEPHIN)  IV  2 g Intravenous Q24H  . cinacalcet  60 mg Oral Daily  . DULoxetine  60 mg Oral Daily  . famotidine  20 mg Oral QHS  . feeding supplement  1 Container Oral TID WC  . gabapentin  100 mg Oral QHS  . glipiZIDE  2.5 mg Oral Q breakfast  . heparin  5,000 Units Subcutaneous Q8H  . [START ON 08/28/2016] Influenza vac split quadrivalent PF  0.5 mL Intramuscular Tomorrow-1000  . insulin aspart  0-9 Units Subcutaneous TID WC  . levothyroxine  75 mcg Oral QAC breakfast  . midodrine  10 mg Oral TID WC  . multivitamin  1 tablet Oral Daily  . nebivolol  2.5 mg Oral Daily  . pravastatin  40 mg Oral Daily  .  senna  1 tablet Oral BID  . sodium chloride flush  3 mL Intravenous Q12H  . sodium chloride flush  3 mL Intravenous Q12H    Continuous Infusions:    LOS: 2 days   Time spent: 25 minutes  Deshay Kirstein Marry Guan, MD [facility] Pager [phone]  If 7PM-7AM, please contact night-coverage [email or web address] Password [email or web address] 08/27/2016, 10:19 AM

## 2016-08-27 NOTE — Progress Notes (Signed)
Subjective:  Feel better , tolerated HD yest, normally MWF / "when can I go home?"  Objective Vital signs in last 24 hours: Vitals:   08/26/16 1429 08/26/16 1701 08/26/16 2145 08/27/16 0621  BP: 98/74 (!) 86/50 (!) 92/50 (!) 96/53  Pulse:  78 65 (!) 51  Resp:  19 18 17   Temp:  98.7 F (37.1 C) 98.7 F (37.1 C) 98.7 F (37.1 C)  TempSrc:  Oral Oral Oral  SpO2:  100% 100% 100%  Weight:   (!) 167.7 kg (369 lb 11.4 oz)   Height:       Weight change: 31.2 kg (68 lb 13.3 oz)  Physical Exam: General: awoken from Sleep NAD Morbidly Obese AAF NAD OX3/ placed Hearing aide in  To converse  Heart: Distant RRR Lungs:  BS distant  / faint Wheeze LUL  Abdomen: Grossly obese, NT Extremities: 1+ bipedal edema/ groin , thigh wounds not undressed Dialysis Access: Pos bruit LUA AVF     OP Dialyzes at [facility] EDW 162.5 kg HD 4 hrs 30 minBath 2K / 2.5 CaDialyzer F200 BFR 500 Heparin 5000 bolus 5000 mid-run Access L AVF Venofer 50 q weekly Mircera 225 mcg q 2 weeks last given 9/27 Calcitriol 1.50 mcg q rx   Problem/Plan: 1 sepsis: 2/2 hidradenitis. Getting vancomycin, clinda, (9/29-), ceftaz (9/29) Gram stain with abundant GNRs, GPCs pairs/ clusters, and gram variable rods; blood cx 9/29 with 1/2 Streptococcus species    Blood pressures are improving 2 ESRD: on MWF.  Had  HD 9/30 off schedule, will get back to regular schedule Monday 10/2 3 Hypotension: on midodrine 10 mg QHS according to mom but TID in chart.  Will give TID. Improving somewhat with treatment of sepsis and HD  Volume uf yesterday / but  difficult in setting of hypotension. Could not get standing wt due to pain. 4. Anemia of ESRD:  Am HGB 8.8< 9.5 =on Mircera 225 q 2 weeks (given 9/27)  no iron in setting of infection 5. 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 Calcitriol 1.50 mcg q dialysis = will  Dc binders wth low Phos  Reck  Labs pre  hd  monday 6. Nutrition: albumin 3.0, will need prostat  Ernest Haber, PA-C [facility] Beeper [phone] 08/27/2016,8:13 AM  LOS: 2 days   Labs: Basic Metabolic Panel:  Recent Labs Lab 08/25/16 2024 08/26/16 0845 08/27/16 0540  NA 135 132* 137  K 3.4* 3.6 3.9  CL 99* 98* 99*  CO2 23 21* 24  GLUCOSE 141* 184* 120*  BUN 60* 66* 53*  CREATININE 9.55* 10.42* 9.54*  CALCIUM 8.3* 8.0* 8.5*  PHOS 1.7*  --   --    Liver Function Tests:  Recent Labs Lab 08/25/16 2024  ALBUMIN 2.5*   No results for input(s): LIPASE, AMYLASE in the last 168 hours. No results for input(s): AMMONIA in the last 168 hours. CBC:  Recent Labs Lab 08/25/16 1131 08/25/16 2024 08/26/16 0845 08/27/16 0540  WBC 17.9* 14.9* 19.3* 18.1*  NEUTROABS 16.3*  --   --   --   HGB 9.8* 8.6* 8.1* 8.8*  HCT 33.8* 29.9* 27.9* 31.2*  MCV 101.2* 101.7* 100.4* 101.6*  PLT 447* 376 356 366   Cardiac Enzymes: No results for input(s): CKTOTAL, CKMB, CKMBINDEX, TROPONINI in the last 168 hours. CBG:  Recent Labs Lab 08/26/16 0739 08/26/16 1301 08/26/16 1637 08/26/16 2134  08/27/16 0808  GLUCAP 175* 189* 168* 242* 111*    Studies/Results: Dg Chest 1 View  Result Date: 08/25/2016 CLINICAL DATA:  Shortness of breath for few days EXAM: CHEST 1 VIEW COMPARISON:  04/30/2015 FINDINGS: Cardiomegaly with progression from prior exam. Central mild vascular congestion and mild interstitial prominence bilateral suspicious for mild interstitial edema. Elevation of the right hemidiaphragm. Bilateral basilar atelectasis. No segmental infiltrate. IMPRESSION: Central mild vascular congestion and mild interstitial prominence bilateral suspicious for mild interstitial edema. Elevation of the right hemidiaphragm. Bilateral basilar atelectasis. No segmental infiltrate. Electronically Signed   By: Lahoma Crocker M.D.   On: 08/25/2016 13:39   Medications:   . acidophilus   Oral Daily  . aspirin EC  81 mg Oral Daily   . calcitRIOL  1.5 mcg Oral Q T,Th,Sa-HD  . calcium acetate  2,001 mg Oral TID WC  . cefTRIAXone (ROCEPHIN)  IV  2 g Intravenous Q24H  . cinacalcet  60 mg Oral Daily  . DULoxetine  60 mg Oral Daily  . famotidine  20 mg Oral QHS  . feeding supplement  1 Container Oral TID WC  . gabapentin  100 mg Oral QHS  . glipiZIDE  2.5 mg Oral Q breakfast  . heparin  5,000 Units Subcutaneous Q8H  . [START ON 08/28/2016] Influenza vac split quadrivalent PF  0.5 mL Intramuscular Tomorrow-1000  . insulin aspart  0-9 Units Subcutaneous TID WC  . levothyroxine  75 mcg Oral QAC breakfast  . midodrine  10 mg Oral TID WC  . multivitamin  1 tablet Oral Daily  . nebivolol  2.5 mg Oral Daily  . pravastatin  40 mg Oral Daily  . senna  1 tablet Oral BID  . sodium chloride flush  3 mL Intravenous Q12H  . sodium chloride flush  3 mL Intravenous Q12H  . sucroferric oxyhydroxide  500 mg Oral TID WC

## 2016-08-28 DIAGNOSIS — A419 Sepsis, unspecified organism: Principal | ICD-10-CM

## 2016-08-28 LAB — BASIC METABOLIC PANEL
ANION GAP: 14 (ref 5–15)
BUN: 68 mg/dL — ABNORMAL HIGH (ref 6–20)
CHLORIDE: 99 mmol/L — AB (ref 101–111)
CO2: 22 mmol/L (ref 22–32)
CREATININE: 11.15 mg/dL — AB (ref 0.44–1.00)
Calcium: 8.1 mg/dL — ABNORMAL LOW (ref 8.9–10.3)
GFR, EST AFRICAN AMERICAN: 4 mL/min — AB (ref >60)
GFR, EST NON AFRICAN AMERICAN: 3 mL/min — AB (ref >60)
GLUCOSE: 123 mg/dL — AB (ref 65–99)
POTASSIUM: 3.7 mmol/L (ref 3.5–5.1)
Sodium: 135 mmol/L (ref 135–145)

## 2016-08-28 LAB — CBC
HEMATOCRIT: 27.9 % — AB (ref 36.0–46.0)
Hemoglobin: 8.1 g/dL — ABNORMAL LOW (ref 12.0–15.0)
MCH: 28.8 pg (ref 26.0–34.0)
MCHC: 29 g/dL — AB (ref 30.0–36.0)
MCV: 99.3 fL (ref 78.0–100.0)
PLATELETS: 351 10*3/uL (ref 150–400)
RBC: 2.81 MIL/uL — ABNORMAL LOW (ref 3.87–5.11)
RDW: 18 % — AB (ref 11.5–15.5)
WBC: 18.8 10*3/uL — ABNORMAL HIGH (ref 4.0–10.5)

## 2016-08-28 LAB — CULTURE, BLOOD (ROUTINE X 2)

## 2016-08-28 LAB — GLUCOSE, CAPILLARY
Glucose-Capillary: 122 mg/dL — ABNORMAL HIGH (ref 65–99)
Glucose-Capillary: 150 mg/dL — ABNORMAL HIGH (ref 65–99)
Glucose-Capillary: 206 mg/dL — ABNORMAL HIGH (ref 65–99)

## 2016-08-28 MED ORDER — ACETAMINOPHEN 325 MG PO TABS
ORAL_TABLET | ORAL | Status: AC
  Filled 2016-08-28: qty 2

## 2016-08-28 MED ORDER — MIDODRINE HCL 5 MG PO TABS
ORAL_TABLET | ORAL | Status: AC
  Administered 2016-08-28: 10 mg via ORAL
  Filled 2016-08-28: qty 2

## 2016-08-28 NOTE — Progress Notes (Signed)
Pt's husband completed dressing change, pt refused to have dressing changed by RN. Leone Brand, RN

## 2016-08-28 NOTE — Consult Note (Signed)
[place] Nurse wound consult note Reason for Consult:Chronic Hydradenitis.  Infectious exacerbation at this time. Refuses surgical consult.  Consult requested for multiple wounds to buttocks and abdomen. Pt has hydradenitis, which is a complex skin condition sometimes requiring surgical intervention. She is familiar to [facility] team and I believe she has been followed in the past at [facility] for this complex medical condition. This is beyond [facility] scope of practice. Surgical consult is recommended if patient agrees.  Purulent drainage from open lesions at this time.  Wound care will center around absorption.   Dressing procedure/placement/frequency: Cleanse hydradenitis lesions with NS and pat gently dry.  Apply calcium alginate to open wounds for absorption, after laying down a nonadherent contact layer to wound bed for atraumatic dressing removal.Cover with 4x4 gauze and ABD pads/tape.  Change daily and PRN soilage.  Mepitel silicone contact layer (LAWSON # [id number]) Calcium alginate (LAWSON # [id number]) Will not follow at this time.  Please re-consult if needed.  Domenic Moras RN BSN [place] Pager [phone]

## 2016-08-28 NOTE — Progress Notes (Signed)
Subjective:  Feel better , tolerated HD yest, normally MWF / "when can I go home?"  Objective Vital signs in last 24 hours: Vitals:   08/28/16 1000 08/28/16 1030 08/28/16 1100 08/28/16 1130  BP: (!) 91/54 122/62 (!) 95/52 (!) 101/49  Pulse: 96 68 67 71  Resp:      Temp:      TempSrc:      SpO2:      Weight:      Height:       Weight change:   Physical Exam: General: awoken from Sleep NAD Morbidly Obese AAF NAD OX3/ placed Hearing aide in  To converse  Heart: Distant RRR Lungs:  BS distant  / faint Wheeze LUL  Abdomen: Grossly obese, NT Extremities: 1+ bipedal edema/ groin , thigh wounds not undressed Dialysis Access: Pos bruit LUA AVF     OP Dialyzes at [facility] EDW 162.5 kg HD 4 hrs 30 minBath 2K / 2.5 CaDialyzer F200 BFR 500 Heparin 5000 bolus 5000 mid-run Access L AVF Venofer 50 q weekly Mircera 225 mcg q 2 weeks last given 9/27 Calcitriol 1.50 mcg q rx   Assessment: 1 Sepsis/ strep bacteremia/ flare of hidradenitis suppurativa - on IV abx.  If dc'd would switch back to vanc or [place].  Doing better.  2. ESRD MWF. HD today 3. Volume - is under dry wt 4. Anemia of ESRD:  Am HGB 8.8< 9.5 =on Mircera 225 q 2 weeks (given 9/27)  no iron in setting of infection 5. 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 Calcitriol 1.50 uf. Dc'd binders wth low P 6. Nutrition: albumin 3.0, will need prostat  Plan - HD today, Rocephin IV  Kelly Splinter MD [facility] pager [phone]   08/28/2016, 12:06 PM    Labs: Basic Metabolic Panel:  Recent Labs Lab 08/25/16 2024 08/26/16 0845 08/27/16 0540 08/28/16 0724  NA 135 132* 137 135  K 3.4* 3.6 3.9 3.7  CL 99* 98* 99* 99*  CO2 23 21* 24 22  GLUCOSE 141* 184* 120* 123*  BUN 60* 66* 53* 68*  CREATININE 9.55* 10.42* 9.54* 11.15*  CALCIUM 8.3* 8.0* 8.5* 8.1*  PHOS 1.7*  --   --   --    Liver Function Tests:  Recent  Labs Lab 08/25/16 2024  ALBUMIN 2.5*   No results for input(s): LIPASE, AMYLASE in the last 168 hours. No results for input(s): AMMONIA in the last 168 hours. CBC:  Recent Labs Lab 08/25/16 1131 08/25/16 2024 08/26/16 0845 08/27/16 0540 08/28/16 0724  WBC 17.9* 14.9* 19.3* 18.1* 18.8*  NEUTROABS 16.3*  --   --   --   --   HGB 9.8* 8.6* 8.1* 8.8* 8.1*  HCT 33.8* 29.9* 27.9* 31.2* 27.9*  MCV 101.2* 101.7* 100.4* 101.6* 99.3  PLT 447* 376 356 366 351   Cardiac Enzymes: No results for input(s): CKTOTAL, CKMB, CKMBINDEX, TROPONINI in the last 168 hours. CBG:  Recent Labs Lab 08/26/16 2134 08/27/16 0808 08/27/16 1138 08/27/16 1634 08/27/16 2124  GLUCAP 242* 111* 155* 140* 216*    Studies/Results: No results found. Medications:   . acidophilus   Oral Daily  . aspirin EC  81 mg Oral Daily  . calcitRIOL  1.5 mcg Oral Q M,W,F-HD  . cefTRIAXone (ROCEPHIN)  IV  2 g Intravenous Q24H  . cinacalcet  60 mg Oral Daily  . DULoxetine  60  mg Oral Daily  . famotidine  20 mg Oral QHS  . feeding supplement  1 Container Oral TID WC  . gabapentin  100 mg Oral QHS  . glipiZIDE  2.5 mg Oral Q breakfast  . heparin  5,000 Units Subcutaneous Q8H  . Influenza vac split quadrivalent PF  0.5 mL Intramuscular Tomorrow-1000  . insulin aspart  0-9 Units Subcutaneous TID WC  . levothyroxine  75 mcg Oral QAC breakfast  . midodrine  10 mg Oral TID WC  . multivitamin  1 tablet Oral Daily  . nebivolol  2.5 mg Oral Daily  . pravastatin  40 mg Oral Daily  . senna  1 tablet Oral BID  . sodium chloride flush  3 mL Intravenous Q12H  . sodium chloride flush  3 mL Intravenous Q12H

## 2016-08-28 NOTE — Progress Notes (Signed)
PROGRESS NOTE  [name] GEZ:[id number] DOB: 1960-12-24 DOA: 08/25/2016 PCP: [name], MD  Hospital Course/Subjective: 55 yo AAF with ESRD on HD and recurrent infected hidradenitis with purulent discharge, lactic acidosis admitted 9/29. She was placed on empiric vancomycin and has refused surgical evaluation as she says she's had worsened lesions every time she has undergone surgery. Due to positive blood cultures, antibiotics were changed 9/30 to IV Rocephin.   Seen on HD today, she has had no acute issues overnight and just wants to know when she can go home. Denies fevers, chills, or uncontrolled pain.  Assessment/Plan: Sepsis - due to infected hidradenitis. Continue present care with IV fluids (with caution due to HD dependence), empiric IV abx. Follow blood cultures. Has been afebrile. Note lactate 9/30 back to normal.  Bacteremia - 1/2 bottles with viridans strep, repeat cx ordered today 10/2, awaiting speciation/sensitivities of initial culture  Hidradenitis - treating with IV abx as above. Patient noted to have refused surgery evaluation. Will consult wound care, if not improving may need imaging or may need to insist on surgical consultation. White count is improving slowly so will continue present care.  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 Improved sepsis physiology.  DM - continue home glipizide, SSI, DM diet and check A1c.  Anemia of CKD - stable, monitor daily  DVT Prophylaxis: Heparin SQ  Code Status: FULL   Family Communication: None present.   Disposition Plan: Back home in 2-3 days.   Consultants:  Renal for HD   Procedures:  None   Antimicrobials:  Vancomycin 9/29-9/30  Rocephin 9/30-->    Objective: Vitals:   08/27/16 0929 08/27/16 1631 08/27/16 2128 08/28/16 0528  BP: (!) 108/52 (!) 111/49 (!) 102/54 (!) 85/48  Pulse: 77 77 72 70  Resp: 16 14 18 16   Temp: 98.2  F (36.8 C) 97.9 F (36.6 C) 98.2 F (36.8 C) 97.9 F (36.6 C)  TempSrc: Oral Oral Oral Oral  SpO2: 100% 100% 100% 100%  Weight:      Height:        Intake/Output Summary (Last 24 hours) at 08/28/16 0840 Last data filed at 08/28/16 0557  Gross per 24 hour  Intake              960 ml  Output                0 ml  Net              960 ml   Filed Weights   08/26/16 0758 08/26/16 1240 08/26/16 2145  Weight: (!) 167.3 kg (368 lb 13.3 oz) (!) 167.3 kg (368 lb 13.3 oz) (!) 167.7 kg (369 lb 11.4 oz)     Exam: General:  Alert, oriented, calm, in no acute distress Neck: supple, no masses, trachea mildline  Cardiovascular: RRR, no murmurs or rubs, no peripheral edema  Respiratory: clear to auscultation bilaterally, no wheezes, no crackles  Abdomen: soft, nontender, nondistended, normal bowel tones heard, obese Skin: dry, no rashes (groin and buttock not examined since on HD) Musculoskeletal: no joint effusions, normal range of motion  Psychiatric: appropriate affect, normal speech  Neurologic: extraocular muscles intact, clear speech, moving all extremities with intact sensorium    Data Reviewed: CBC:  Recent Labs Lab 08/25/16 1131 08/25/16 2024 08/26/16 0845 08/27/16 0540 08/28/16 0724  WBC 17.9* 14.9* 19.3* 18.1* 18.8*  NEUTROABS 16.3*  --   --   --   --  HGB 9.8* 8.6* 8.1* 8.8* 8.1*  HCT 33.8* 29.9* 27.9* 31.2* 27.9*  MCV 101.2* 101.7* 100.4* 101.6* 99.3  PLT 447* 376 356 366 786   Basic Metabolic Panel:  Recent Labs Lab 08/25/16 1131 08/25/16 2024 08/26/16 0845 08/27/16 0540 08/28/16 0724  NA 134* 135 132* 137 135  K 3.3* 3.4* 3.6 3.9 3.7  CL 98* 99* 98* 99* 99*  CO2 21* 23 21* 24 22  GLUCOSE 187* 141* 184* 120* 123*  BUN 54* 60* 66* 53* 68*  CREATININE 8.74* 9.55* 10.42* 9.54* 11.15*  CALCIUM 8.9 8.3* 8.0* 8.5* 8.1*  PHOS  --  1.7*  --   --   --    GFR: Estimated Creatinine Clearance: 9.4 mL/min (by C-G formula based on SCr of 11.15 mg/dL  (H)). Liver Function Tests:  Recent Labs Lab 08/25/16 2024  ALBUMIN 2.5*   No results for input(s): LIPASE, AMYLASE in the last 168 hours. No results for input(s): AMMONIA in the last 168 hours. Coagulation Profile: No results for input(s): INR, PROTIME in the last 168 hours. Cardiac Enzymes: No results for input(s): CKTOTAL, CKMB, CKMBINDEX, TROPONINI in the last 168 hours. BNP (last 3 results) No results for input(s): PROBNP in the last 8760 hours. HbA1C: No results for input(s): HGBA1C in the last 72 hours. CBG:  Recent Labs Lab 08/26/16 2134 08/27/16 0808 08/27/16 1138 08/27/16 1634 08/27/16 2124  GLUCAP 242* 111* 155* 140* 216*   Lipid Profile: No results for input(s): CHOL, HDL, LDLCALC, TRIG, CHOLHDL, LDLDIRECT in the last 72 hours. Thyroid Function Tests: No results for input(s): TSH, T4TOTAL, FREET4, T3FREE, THYROIDAB in the last 72 hours. Anemia Panel: No results for input(s): VITAMINB12, FOLATE, FERRITIN, TIBC, IRON, RETICCTPCT in the last 72 hours. Urine analysis:    Component Value Date/Time   COLORURINE YELLOW 07/18/2013 0831   APPEARANCEUR CLOUDY (A) 07/18/2013 0831   LABSPEC 1.012 07/18/2013 0831   PHURINE 5.0 07/18/2013 0831   GLUCOSEU NEGATIVE 07/18/2013 0831   HGBUR SMALL (A) 07/18/2013 0831   BILIRUBINUR NEGATIVE 07/18/2013 0831   KETONESUR NEGATIVE 07/18/2013 0831   PROTEINUR 30 (A) 07/18/2013 0831   UROBILINOGEN 0.2 07/18/2013 0831   NITRITE NEGATIVE 07/18/2013 0831   LEUKOCYTESUR MODERATE (A) 07/18/2013 0831   Sepsis Labs: @LABRCNTIP (procalcitonin:4,lacticidven:4)  ) Recent Results (from the past 240 hour(s))  Aerobic/Anaerobic Culture (surgical/deep wound)     Status: None (Preliminary result)   Collection Time: 08/25/16  1:53 PM  Result Value Ref Range Status   Specimen Description BUTTOCKS  Final   Special Requests Immunocompromised  Final   Gram Stain   Final    FEW WBC PRESENT, PREDOMINANTLY PMN ABUNDANT GRAM POSITIVE COCCI IN  PAIRS IN CLUSTERS ABUNDANT GRAM NEGATIVE RODS MODERATE GRAM VARIABLE ROD    Culture NORMAL SKIN FLORA HOLDING FOR POSSIBLE ANAEROBE   Final   Report Status PENDING  Incomplete  Blood culture (routine x 2)     Status: Abnormal (Preliminary result)   Collection Time: 08/25/16  2:35 PM  Result Value Ref Range Status   Specimen Description BLOOD RIGHT ARM  Final   Special Requests BOTTLES DRAWN AEROBIC AND ANAEROBIC 5CC  Final   Culture  Setup Time   Final    GRAM POSITIVE COCCI IN CHAINS ANAEROBIC BOTTLE ONLY CRITICAL RESULT CALLED TO, READ BACK BY AND VERIFIED WITH: T STONE,PHARMD AT 1003 08/26/16 BY L BENFIELD    Culture (A)  Final    VIRIDANS STREPTOCOCCUS SUSCEPTIBILITIES TO FOLLOW    Report Status PENDING  Incomplete  Blood Culture ID Panel (Reflexed)     Status: Abnormal   Collection Time: 08/25/16  2:35 PM  Result Value Ref Range Status   Enterococcus species NOT DETECTED NOT DETECTED Final   Listeria monocytogenes NOT DETECTED NOT DETECTED Final   Staphylococcus species NOT DETECTED NOT DETECTED Final   Staphylococcus aureus NOT DETECTED NOT DETECTED Final   Streptococcus species DETECTED (A) NOT DETECTED Final    Comment: CRITICAL RESULT CALLED TO, READ BACK BY AND VERIFIED WITH: T STONE,PHARMD AT 1003 08/26/16 BY L BENFIELD    Streptococcus agalactiae NOT DETECTED NOT DETECTED Final   Streptococcus pneumoniae NOT DETECTED NOT DETECTED Final   Streptococcus pyogenes NOT DETECTED NOT DETECTED Final   Acinetobacter baumannii NOT DETECTED NOT DETECTED Final   Enterobacteriaceae species NOT DETECTED NOT DETECTED Final   Enterobacter cloacae complex NOT DETECTED NOT DETECTED Final   Escherichia coli NOT DETECTED NOT DETECTED Final   Klebsiella oxytoca NOT DETECTED NOT DETECTED Final   Klebsiella pneumoniae NOT DETECTED NOT DETECTED Final   Proteus species NOT DETECTED NOT DETECTED Final   Serratia marcescens NOT DETECTED NOT DETECTED Final   Haemophilus influenzae NOT  DETECTED NOT DETECTED Final   Neisseria meningitidis NOT DETECTED NOT DETECTED Final   Pseudomonas aeruginosa NOT DETECTED NOT DETECTED Final   Candida albicans NOT DETECTED NOT DETECTED Final   Candida glabrata NOT DETECTED NOT DETECTED Final   Candida krusei NOT DETECTED NOT DETECTED Final   Candida parapsilosis NOT DETECTED NOT DETECTED Final   Candida tropicalis NOT DETECTED NOT DETECTED Final  Blood culture (routine x 2)     Status: None (Preliminary result)   Collection Time: 08/25/16  2:40 PM  Result Value Ref Range Status   Specimen Description BLOOD RIGHT HAND  Final   Special Requests BOTTLES DRAWN AEROBIC AND ANAEROBIC 5CC  Final   Culture  Setup Time   Final    GRAM POSITIVE COCCI IN CHAINS ANAEROBIC BOTTLE ONLY CRITICAL VALUE NOTED.  VALUE IS CONSISTENT WITH PREVIOUSLY REPORTED AND CALLED VALUE.    Culture   Final    GRAM POSITIVE COCCI CULTURE REINCUBATED FOR BETTER GROWTH    Report Status PENDING  Incomplete  MRSA PCR Screening     Status: None   Collection Time: 08/26/16  1:56 AM  Result Value Ref Range Status   MRSA by PCR NEGATIVE NEGATIVE Final    Comment:        The GeneXpert MRSA Assay (FDA approved for NASAL specimens only), is one component of a comprehensive MRSA colonization surveillance program. It is not intended to diagnose MRSA infection nor to guide or monitor treatment for MRSA infections.      Studies: No results found.  Scheduled Meds: . acidophilus   Oral Daily  . aspirin EC  81 mg Oral Daily  . calcitRIOL  1.5 mcg Oral Q M,W,F-HD  . cefTRIAXone (ROCEPHIN)  IV  2 g Intravenous Q24H  . cinacalcet  60 mg Oral Daily  . DULoxetine  60 mg Oral Daily  . famotidine  20 mg Oral QHS  . feeding supplement  1 Container Oral TID WC  . gabapentin  100 mg Oral QHS  . glipiZIDE  2.5 mg Oral Q breakfast  . heparin  5,000 Units Subcutaneous Q8H  . Influenza vac split quadrivalent PF  0.5 mL Intramuscular Tomorrow-1000  . insulin aspart  0-9  Units Subcutaneous TID WC  . levothyroxine  75 mcg Oral QAC breakfast  . midodrine  10 mg  Oral TID WC  . multivitamin  1 tablet Oral Daily  . nebivolol  2.5 mg Oral Daily  . pravastatin  40 mg Oral Daily  . senna  1 tablet Oral BID  . sodium chloride flush  3 mL Intravenous Q12H  . sodium chloride flush  3 mL Intravenous Q12H    Continuous Infusions:    LOS: 3 days   Time spent: 24 minutes  Mir Marry Guan, MD [facility] Pager [phone]  If 7PM-7AM, please contact night-coverage [email or web address] Password [email or web address] 08/28/2016, 8:40 AM

## 2016-08-29 ENCOUNTER — Inpatient Hospital Stay (HOSPITAL_COMMUNITY): Payer: Medicare Other

## 2016-08-29 DIAGNOSIS — R7881 Bacteremia: Secondary | ICD-10-CM

## 2016-08-29 DIAGNOSIS — B955 Unspecified streptococcus as the cause of diseases classified elsewhere: Secondary | ICD-10-CM

## 2016-08-29 LAB — CULTURE, BLOOD (ROUTINE X 2)

## 2016-08-29 LAB — CBC
HEMATOCRIT: 29.3 % — AB (ref 36.0–46.0)
Hemoglobin: 8.3 g/dL — ABNORMAL LOW (ref 12.0–15.0)
MCH: 28.6 pg (ref 26.0–34.0)
MCHC: 28.3 g/dL — AB (ref 30.0–36.0)
MCV: 101 fL — ABNORMAL HIGH (ref 78.0–100.0)
Platelets: 374 10*3/uL (ref 150–400)
RBC: 2.9 MIL/uL — ABNORMAL LOW (ref 3.87–5.11)
RDW: 18.2 % — AB (ref 11.5–15.5)
WBC: 17 10*3/uL — ABNORMAL HIGH (ref 4.0–10.5)

## 2016-08-29 LAB — BASIC METABOLIC PANEL
Anion gap: 10 (ref 5–15)
BUN: 55 mg/dL — AB (ref 6–20)
CALCIUM: 8.1 mg/dL — AB (ref 8.9–10.3)
CO2: 24 mmol/L (ref 22–32)
Chloride: 100 mmol/L — ABNORMAL LOW (ref 101–111)
Creatinine, Ser: 9.63 mg/dL — ABNORMAL HIGH (ref 0.44–1.00)
GFR calc Af Amer: 5 mL/min — ABNORMAL LOW (ref >60)
GFR, EST NON AFRICAN AMERICAN: 4 mL/min — AB (ref >60)
GLUCOSE: 98 mg/dL (ref 65–99)
POTASSIUM: 3.9 mmol/L (ref 3.5–5.1)
Sodium: 134 mmol/L — ABNORMAL LOW (ref 135–145)

## 2016-08-29 LAB — GLUCOSE, CAPILLARY
GLUCOSE-CAPILLARY: 87 mg/dL (ref 65–99)
GLUCOSE-CAPILLARY: 154 mg/dL — AB (ref 65–99)
Glucose-Capillary: 100 mg/dL — ABNORMAL HIGH (ref 65–99)
Glucose-Capillary: 90 mg/dL (ref 65–99)
Glucose-Capillary: 137 mg/dL — ABNORMAL HIGH (ref 65–99)

## 2016-08-29 MED ORDER — SODIUM CHLORIDE 0.9 % IV SOLN
2000.0000 mg | Freq: Once | INTRAVENOUS | Status: AC
  Administered 2016-08-29: 2000 mg via INTRAVENOUS
  Filled 2016-08-29: qty 2000

## 2016-08-29 MED ORDER — METRONIDAZOLE 500 MG PO TABS
500.0000 mg | ORAL_TABLET | Freq: Three times a day (TID) | ORAL | Status: DC
  Administered 2016-08-29 – 2016-08-31 (×6): 500 mg via ORAL
  Filled 2016-08-29 (×6): qty 1

## 2016-08-29 MED ORDER — PROMETHAZINE HCL 25 MG/ML IJ SOLN: 6.2500 mg | INTRAMUSCULAR | Status: DC | PRN

## 2016-08-29 MED ORDER — HYDROMORPHONE HCL 1 MG/ML IJ SOLN
0.2500 mg | INTRAMUSCULAR | Status: DC | PRN
Start: 2016-08-29 — End: 2016-08-31

## 2016-08-29 MED ORDER — VANCOMYCIN HCL IN DEXTROSE 1-5 GM/200ML-% IV SOLN
1000.0000 mg | INTRAVENOUS | Status: DC
  Administered 2016-08-30: 1000 mg via INTRAVENOUS
  Filled 2016-08-29: qty 200

## 2016-08-29 NOTE — Consult Note (Signed)
Date of Admission:  08/25/2016  Date of Consult:  08/29/2016  Reason for Consult: Streptococcus Salivarius bacteremia Referring Physician: Dr. Hollice Gong   HPI: [name] is an 55 y.o. female well known to me when I took care of her last in 2012 who suffers from chronic Hidradenitis suppurativa. Since I last saw her her CKD had progressed to ESRD and she has been on HD. She was admitted with fevers, sepsis with lactic acidosis to the hospitalist service. She has been seen by [facility] but refused Surgical consultation. She is very frustrated with various referrals where subspecialists have not been able to offer therapy such as Humira for example for her HS due to her comorbities or where sweat gland excision was not offered for similar reasons. She appears to have multiple fistulating tracts in perneal and gluteal area and these were draining purulent discharge. She has been on broad spectrum antibiotics but now is growing Streptoccoccus Salivarius from her blood in 2/2 cultures I to PCN but S to rocephin, vancomycin.     Past Medical History:  Diagnosis Date  . Chronic diastolic CHF (congestive heart failure) ([place])   . Chronic kidney disease (CKD), stage III (moderate)    [name] nephrology lov note 05-12-2013 on pt chart now on HD  . DCM (dilated cardiomyopathy) ([place])    nonischemic  . Depression   . DM type 2 (diabetes mellitus, type 2) (H[facility])    diet controlledwith retinopathy and nephropathy  . GERD (gastroesophageal reflux disease)   . H/O cardiac arrest 10/2012  . Hard of hearing 10/2012   now wears 2 hearing aids  . History of hemodialysis dec 2013  . HTN (hypertension)   . Hydradenitis   . Hyperkalemia   . Hyperlipidemia   . Hypothyroidism   . Iron deficiency anemia   . Morbid obesity ([place])   . Multinodular goiter   . Pneumonia Nov 10, 2012  . Sleep apnea    no test yet per mother 10/22/13   . SOB (shortness of breath)    hx of   . Urinary tract infection    taking antibiotics for 3 days prior to surgery    Past Surgical History:  Procedure Laterality Date  . AV FISTULA PLACEMENT Left 07/25/2013   Procedure: ARTERIOVENOUS (AV) FISTULA CREATION ;  Surgeon: Rosetta Posner, MD;  Location: [place];  Service: Vascular;  Laterality: Left;  . CYSTOSCOPY W/ RETROGRADES  11/16/2012   Procedure: CYSTOSCOPY WITH RETROGRADE PYELOGRAM;  Surgeon: Reece Packer, MD;  Location: [facility];  Service: Urology;  Laterality: Bilateral;  CYSTOSCOPY,BILATERAL RETROGRADE PYELOGRAM/ REMOVAL LEFT URETERAL STENT/ FULGERATION BLADDER MUCOSA/ INSERTION RIGHT URETERAL STENT  . CYSTOSCOPY W/ URETERAL STENT PLACEMENT  05/28/2012   Procedure: CYSTOSCOPY WITH RETROGRADE PYELOGRAM/URETERAL STENT PLACEMENT;  Surgeon: Reece Packer, MD;  [facility];  Service: Urology;  Laterality: Left;  . CYSTOSCOPY WITH RETROGRADE PYELOGRAM, URETEROSCOPY AND STENT PLACEMENT Right 06/23/2013   Procedure: CYSTOSCOPY WITH RIGHT URETEROSCOPY, RIGHT  RETROGRADE PYELOGRAM, WITH LASER LIPOTRIPSY AND RIGHT URETERAL STENT EXCHANGE ;  Surgeon: Molli Hazard, MD;  Location[facility];  Service: Urology;  Laterality: Right;  STENT EXCHANGE    . CYSTOSCOPY WITH RETROGRADE PYELOGRAM, URETEROSCOPY AND STENT PLACEMENT Right 11/06/2013   Procedure: CYSTOSCOPY WITH RETROGRADE PYELOGRAM, URETEROSCOPY STONE REMOVAL AND STENT REMOVAL;  Surgeon: Molli Hazard, MD;  [facility];  Service: Urology;  Laterality: Right;  . HOLMIUM LASER APPLICATION N/A 07/22/4157   Procedure: HOLMIUM  LASER APPLICATION;  Surgeon: Molli Hazard, MD;  [facility];  Service: Urology;  Laterality: N/A;  . INSERTION OF DIALYSIS CATHETER N/A 09/24/2013   Procedure: INSERTION OF DIALYSIS CATHETER;  Surgeon: Rosetta Posner, MD;  Location: [place];  Service: Vascular;  Laterality: N/A;  . PORT-A-CATH REMOVAL    . PORTACATH PLACEMENT    . REVISON OF ARTERIOVENOUS FISTULA Left 07/25/2016   Procedure: REVISON OF LEFT  ARTERIOVENOUS FISTULA;  Surgeon: Elam Dutch, MD;  Location: [place];  Service: Vascular;  Laterality: Left;    Social History:  reports that she quit smoking about 36 years ago. Her smoking use included Cigarettes. She has a 0.25 pack-year smoking history. She has never used smokeless tobacco. She reports that she does not drink alcohol or use drugs.   Family History  Problem Relation Age of Onset  . Coronary artery disease Mother   . Hypertension Mother   . Diabetes type II Mother   . Coronary artery disease Father   . Hypertension Father   . Malignant hyperthermia Father   . Cancer Maternal Grandfather     ? Type  . Kidney failure Maternal Grandmother     Allergies  Allergen Reactions  . Rosiglitazone Maleate Swelling and Other (See Comments)    SWELLING REACTION UNSPECIFIED   . Amoxicillin Rash and Other (See Comments)    Tolerated Zosyn 01/2013. [place]  . Amoxicillin-Pot Clavulanate Diarrhea     Medications: I have reviewed patients current medications as documented in Epic Anti-infectives    Start     Dose/Rate Route Frequency Ordered Stop   08/26/16 1030  cefTRIAXone (ROCEPHIN) 2 g in dextrose 5 % 50 mL IVPB     2 g 100 mL/hr over 30 Minutes Intravenous Every 24 hours 08/26/16 1021     08/25/16 2000  clindamycin (CLEOCIN) IVPB 600 mg  Status:  Discontinued     600 mg 100 mL/hr over 30 Minutes Intravenous Every 8 hours 08/25/16 1831 08/26/16 1021   08/25/16 1845  cefTAZidime (FORTAZ) 2 g in dextrose 5 % 50 mL IVPB     2 g 100 mL/hr over 30 Minutes Intravenous  Once 08/25/16 1819 08/25/16 2247   08/25/16 1330  vancomycin (VANCOCIN) IVPB 1000 mg/200 mL premix     1,000 mg 200 mL/hr over 60 Minutes Intravenous  Once 08/25/16 1320 08/25/16 1552         ROS: as in HPI otherwise remainder of 12 point Review of Systems is negative   Blood pressure (!) 90/48, pulse 66, temperature 98 F (36.7 C), temperature source Oral, resp. rate 20, height '5\' 6"'  (1.676  m), weight (!) 360 lb 3.7 oz (163.4 kg), last menstrual period 06/13/2013, SpO2 100 %. General: Alert and awake, oriented x3, not in any acute distress. HEENT: anicteric sclera,  EOMI, oropharynx clear and without exudate Cardiovascular: regular rate, normal r,  no murmur rubs or gallops Pulmonary: clear to auscultation bilaterally, no wheezing, rales or rhonchi Gastrointestinal: soft nontender, nondistended, normal bowel sounds, Musculoskeletal: Skin, soft tissue areas examined see pictures    Neuro: nonfocal, strength and sensation intact  08/29/16:  Left perineal area    Right perineal area:    Left buttocks:    Right buttocks:      Results for orders placed or performed during the hospital encounter of 08/25/16 (from the past 48 hour(s))  Glucose, capillary     Status: Abnormal   Collection Time: 08/27/16  9:24 PM  Result Value  Ref Range   Glucose-Capillary 216 (H) 65 - 99 mg/dL  Basic metabolic panel     Status: Abnormal   Collection Time: 08/28/16  7:24 AM  Result Value Ref Range   Sodium 135 135 - 145 mmol/L   Potassium 3.7 3.5 - 5.1 mmol/L   Chloride 99 (L) 101 - 111 mmol/L   CO2 22 22 - 32 mmol/L   Glucose, Bld 123 (H) 65 - 99 mg/dL   BUN 68 (H) 6 - 20 mg/dL   Creatinine, Ser 11.15 (H) 0.44 - 1.00 mg/dL   Calcium 8.1 (L) 8.9 - 10.3 mg/dL   GFR calc non Af Amer 3 (L) >60 mL/min   GFR calc Af Amer 4 (L) >60 mL/min    Comment: (NOTE) The eGFR has been calculated using the CKD EPI equation. This calculation has not been validated in all clinical situations. eGFR's persistently <60 mL/min signify possible Chronic Kidney Disease.    Anion gap 14 5 - 15  CBC     Status: Abnormal   Collection Time: 08/28/16  7:24 AM  Result Value Ref Range   WBC 18.8 (H) 4.0 - 10.5 K/uL   RBC 2.81 (L) 3.87 - 5.11 MIL/uL   Hemoglobin 8.1 (L) 12.0 - 15.0 g/dL   HCT 27.9 (L) 36.0 - 46.0 %   MCV 99.3 78.0 - 100.0 fL   MCH 28.8 26.0 - 34.0 pg   MCHC 29.0 (L) 30.0 - 36.0  g/dL   RDW 18.0 (H) 11.5 - 15.5 %   Platelets 351 150 - 400 K/uL  Glucose, capillary     Status: Abnormal   Collection Time: 08/28/16  1:04 PM  Result Value Ref Range   Glucose-Capillary 122 (H) 65 - 99 mg/dL  Culture, blood (routine x 2)     Status: None (Preliminary result)   Collection Time: 08/28/16  2:25 PM  Result Value Ref Range   Specimen Description BLOOD RIGHT HAND    Special Requests IN PEDIATRIC BOTTLE 0.5ML    Culture NO GROWTH < 24 HOURS    Report Status PENDING   Culture, blood (routine x 2)     Status: None (Preliminary result)   Collection Time: 08/28/16  2:29 PM  Result Value Ref Range   Specimen Description BLOOD RIGHT HAND    Special Requests IN PEDIATRIC BOTTLE 0.5 ML    Culture NO GROWTH < 24 HOURS    Report Status PENDING   Glucose, capillary     Status: Abnormal   Collection Time: 08/28/16  4:25 PM  Result Value Ref Range   Glucose-Capillary 150 (H) 65 - 99 mg/dL  Glucose, capillary     Status: Abnormal   Collection Time: 08/28/16 10:44 PM  Result Value Ref Range   Glucose-Capillary 206 (H) 65 - 99 mg/dL  Basic metabolic panel     Status: Abnormal   Collection Time: 08/29/16  4:02 AM  Result Value Ref Range   Sodium 134 (L) 135 - 145 mmol/L   Potassium 3.9 3.5 - 5.1 mmol/L   Chloride 100 (L) 101 - 111 mmol/L   CO2 24 22 - 32 mmol/L   Glucose, Bld 98 65 - 99 mg/dL   BUN 55 (H) 6 - 20 mg/dL   Creatinine, Ser 9.63 (H) 0.44 - 1.00 mg/dL   Calcium 8.1 (L) 8.9 - 10.3 mg/dL   GFR calc non Af Amer 4 (L) >60 mL/min   GFR calc Af Amer 5 (L) >60 mL/min    Comment: (NOTE)  The eGFR has been calculated using the CKD EPI equation. This calculation has not been validated in all clinical situations. eGFR's persistently <60 mL/min signify possible Chronic Kidney Disease.    Anion gap 10 5 - 15  CBC     Status: Abnormal   Collection Time: 08/29/16  4:02 AM  Result Value Ref Range   WBC 17.0 (H) 4.0 - 10.5 K/uL   RBC 2.90 (L) 3.87 - 5.11 MIL/uL   Hemoglobin  8.3 (L) 12.0 - 15.0 g/dL   HCT 29.3 (L) 36.0 - 46.0 %   MCV 101.0 (H) 78.0 - 100.0 fL   MCH 28.6 26.0 - 34.0 pg   MCHC 28.3 (L) 30.0 - 36.0 g/dL   RDW 18.2 (H) 11.5 - 15.5 %   Platelets 374 150 - 400 K/uL  Glucose, capillary     Status: Abnormal   Collection Time: 08/29/16  6:33 AM  Result Value Ref Range   Glucose-Capillary 100 (H) 65 - 99 mg/dL  Glucose, capillary     Status: None   Collection Time: 08/29/16  7:24 AM  Result Value Ref Range   Glucose-Capillary 87 65 - 99 mg/dL  Glucose, capillary     Status: None   Collection Time: 08/29/16 12:03 PM  Result Value Ref Range   Glucose-Capillary 90 65 - 99 mg/dL  Glucose, capillary     Status: Abnormal   Collection Time: 08/29/16  4:17 PM  Result Value Ref Range   Glucose-Capillary 137 (H) 65 - 99 mg/dL   '@BRIEFLABTABLE' (sdes,specrequest,cult,reptstatus)   ) Recent Results (from the past 720 hour(s))  Aerobic/Anaerobic Culture (surgical/deep wound)     Status: None (Preliminary result)   Collection Time: 08/25/16  1:53 PM  Result Value Ref Range Status   Specimen Description BUTTOCKS  Final   Special Requests Immunocompromised  Final   Gram Stain   Final    FEW WBC PRESENT, PREDOMINANTLY PMN ABUNDANT GRAM POSITIVE COCCI IN PAIRS IN CLUSTERS ABUNDANT GRAM NEGATIVE RODS MODERATE GRAM VARIABLE ROD    Culture NORMAL SKIN FLORA HOLDING FOR POSSIBLE ANAEROBE   Final   Report Status PENDING  Incomplete  Blood culture (routine x 2)     Status: Abnormal   Collection Time: 08/25/16  2:35 PM  Result Value Ref Range Status   Specimen Description BLOOD RIGHT ARM  Final   Special Requests BOTTLES DRAWN AEROBIC AND ANAEROBIC 5CC  Final   Culture  Setup Time   Final    GRAM POSITIVE COCCI IN CHAINS ANAEROBIC BOTTLE ONLY CRITICAL RESULT CALLED TO, READ BACK BY AND VERIFIED WITH: T STONE,PHARMD AT 1003 08/26/16 BY L BENFIELD    Culture STREPTOCOCCUS SALIVARIUS (A)  Final   Report Status 08/29/2016 FINAL  Final   Organism ID,  Bacteria STREPTOCOCCUS SALIVARIUS  Final      Susceptibility   Streptococcus salivarius - MIC*    PENICILLIN Value in next row Intermediate      INTERMEDIATE0.25 ;0.25    CEFTRIAXONE Value in next row Sensitive      SENSITIVE0.25    ERYTHROMYCIN Value in next row Resistant      SENSITIVE0.25    LEVOFLOXACIN Value in next row Sensitive      SENSITIVE0.25    VANCOMYCIN Value in next row Sensitive      SENSITIVE0.25    * STREPTOCOCCUS SALIVARIUS  Blood Culture ID Panel (Reflexed)     Status: Abnormal   Collection Time: 08/25/16  2:35 PM  Result Value Ref Range Status  Enterococcus species NOT DETECTED NOT DETECTED Final   Listeria monocytogenes NOT DETECTED NOT DETECTED Final   Staphylococcus species NOT DETECTED NOT DETECTED Final   Staphylococcus aureus NOT DETECTED NOT DETECTED Final   Streptococcus species DETECTED (A) NOT DETECTED Final    Comment: CRITICAL RESULT CALLED TO, READ BACK BY AND VERIFIED WITH: T STONE,PHARMD AT 1003 08/26/16 BY L BENFIELD    Streptococcus agalactiae NOT DETECTED NOT DETECTED Final   Streptococcus pneumoniae NOT DETECTED NOT DETECTED Final   Streptococcus pyogenes NOT DETECTED NOT DETECTED Final   Acinetobacter baumannii NOT DETECTED NOT DETECTED Final   Enterobacteriaceae species NOT DETECTED NOT DETECTED Final   Enterobacter cloacae complex NOT DETECTED NOT DETECTED Final   Escherichia coli NOT DETECTED NOT DETECTED Final   Klebsiella oxytoca NOT DETECTED NOT DETECTED Final   Klebsiella pneumoniae NOT DETECTED NOT DETECTED Final   Proteus species NOT DETECTED NOT DETECTED Final   Serratia marcescens NOT DETECTED NOT DETECTED Final   Haemophilus influenzae NOT DETECTED NOT DETECTED Final   Neisseria meningitidis NOT DETECTED NOT DETECTED Final   Pseudomonas aeruginosa NOT DETECTED NOT DETECTED Final   Candida albicans NOT DETECTED NOT DETECTED Final   Candida glabrata NOT DETECTED NOT DETECTED Final   Candida krusei NOT DETECTED NOT DETECTED  Final   Candida parapsilosis NOT DETECTED NOT DETECTED Final   Candida tropicalis NOT DETECTED NOT DETECTED Final  Blood culture (routine x 2)     Status: Abnormal   Collection Time: 08/25/16  2:40 PM  Result Value Ref Range Status   Specimen Description BLOOD RIGHT HAND  Final   Special Requests BOTTLES DRAWN AEROBIC AND ANAEROBIC 5CC  Final   Culture  Setup Time   Final    GRAM POSITIVE COCCI IN CHAINS ANAEROBIC BOTTLE ONLY CRITICAL VALUE NOTED.  VALUE IS CONSISTENT WITH PREVIOUSLY REPORTED AND CALLED VALUE.    Culture (A)  Final    STREPTOCOCCUS SALIVARIUS  SUSCEPTIBILITIES PERFORMED ON PREVIOUS CULTURE WITHIN THE LAST 5 DAYS.    Report Status 08/28/2016 FINAL  Final  MRSA PCR Screening     Status: None   Collection Time: 08/26/16  1:56 AM  Result Value Ref Range Status   MRSA by PCR NEGATIVE NEGATIVE Final    Comment:        The GeneXpert MRSA Assay (FDA approved for NASAL specimens only), is one component of a comprehensive MRSA colonization surveillance program. It is not intended to diagnose MRSA infection nor to guide or monitor treatment for MRSA infections.   Culture, blood (routine x 2)     Status: None (Preliminary result)   Collection Time: 08/28/16  2:25 PM  Result Value Ref Range Status   Specimen Description BLOOD RIGHT HAND  Final   Special Requests IN PEDIATRIC BOTTLE 0.5ML  Final   Culture NO GROWTH < 24 HOURS  Final   Report Status PENDING  Incomplete  Culture, blood (routine x 2)     Status: None (Preliminary result)   Collection Time: 08/28/16  2:29 PM  Result Value Ref Range Status   Specimen Description BLOOD RIGHT HAND  Final   Special Requests IN PEDIATRIC BOTTLE 0.5 ML  Final   Culture NO GROWTH < 24 HOURS  Final   Report Status PENDING  Incomplete     Impression/Recommendation  Principal Problem:   Sepsis affecting skin ([place]) Active Problems:   Morbid obesity ([place])   DM type 2 (diabetes mellitus, type 2) ([place])   Hypertension    Hydradenitis  ESRD (end stage renal disease) on dialysis ([facility])   Jacquelinne Pownall is a 55 y.o. female with  Severe HS, morbid obesity, DM, ESRD on HD with admission with sepsis draining purulent HS lesions and now Streptococcus salvarius  #1 Streptococcus salivarius bacteremia:  This is an organism more commonly found in the oral cavity.It has also apparently been used in probiotic preparations.  It certainly can cause endocarditis. IF Shellye had endocarditis the textbook treament with her PCN MIC would be PCN x 4 weejs + gent x 2 weeks vs Ceftriaxone x 4 weeks vs Vancomycin x 4 weeks with Day #1 being date of first negative blood cultures  She agrees now to panorex (at least this afternoon)  While in an ideal world I would like to know if she has endocarditis she is apprehensive re anesthesia and she is morbidly obese and with multiple problems. She would not be a surgical candidate for CVTS  Therefore I think it is fine to get TTE but not TEE and go ahead and treat her empirically for endocarditis for 4 weeks with IV vancomycin which can be given with her HD  I will add vancomycin to her rocephin for now ( I will leave the latter on board for bactericidal beta lactam activity in house and to cover her HS more broadly   #2 Severe  HS : as above will give her 4 weeks of IV vancomycin, rocephin while in house. I will also add metronidazole for anerobic coverage.  When she leaves the hospital we can dc the rocephin and extend the flagyl for total of 2 weeks  I am happy to follow her again in the outpatient world and prescribe chronic antibiotics realizing that none will be "magical" I am sad that there appear to be no one locally who can provide a service surgically that she finds acceptable. I believe I had previously tried to get her plugged into an academic center for consideration of gastric bypass and sweat gland excisions.  Her global health has worsened significantly   08/29/2016, 7:09  PM   Thank you so much for this interesting consult  [place] [facility] [place] [phone] (pager) [phone] (office) 08/29/2016, 7:09 PM  Rhina [place] 08/29/2016, 7:09 PM

## 2016-08-29 NOTE — Progress Notes (Signed)
[facility] Progress Note   Subjective:  "I feel all right today". No C/Os. Very pleasant. Husband of 25 years at bedside. Asking when will she will be able to go home.  Objective Vitals:   08/28/16 1213 08/28/16 1307 08/28/16 2203 08/29/16 0500  BP:  (!) 97/47 (!) 86/45 (!) 89/40  Pulse: 73 73 63 68  Resp: 20 18 18 18   Temp:  98 F (36.7 C) 98.8 F (37.1 C) 98.7 F (37.1 C)  TempSrc:  Oral Oral Oral  SpO2:  100% 97% 100%  Weight:      Height:       Physical Exam General: Well nourished, NAD Heart: Heart sounds distant, S1,S2. RRR. SR on monitor rate 70s Lungs: BBS CTA A/P Abdomen: obese, active bs Extremities: No LE edema. Skin: Warm, dry, has drsgs on wounds on buttocks. Few open hidradenitis lesions lower portion of buttocks, sacral area.  Dialysis Access: LUA AVF + bruit-better heard lower portion of AVF  Dialysis Orders: [facility]  162.5 kg 4 hrs 30 min 2K / 2.5 Ca 500/800 Heparin 5000 units IV bolus at beginning of tx 5000 units IV  mid-run  Venofer 50 q weekly Mircera 225 mcg q 2 weeks last given 9/27 Calcitriol 1.50 mcg q rx  Additional Objective Labs: Basic Metabolic Panel:  Recent Labs Lab 08/25/16 2024  08/27/16 0540 08/28/16 0724 08/29/16 0402  NA 135  < > 137 135 134*  K 3.4*  < > 3.9 3.7 3.9  CL 99*  < > 99* 99* 100*  CO2 23  < > 24 22 24   GLUCOSE 141*  < > 120* 123* 98  BUN 60*  < > 53* 68* 55*  CREATININE 9.55*  < > 9.54* 11.15* 9.63*  CALCIUM 8.3*  < > 8.5* 8.1* 8.1*  PHOS 1.7*  --   --   --   --   < > = values in this interval not displayed. Liver Function Tests:  Recent Labs Lab 08/25/16 2024  ALBUMIN 2.5*   No results for input(s): LIPASE, AMYLASE in the last 168 hours. CBC:  Recent Labs Lab 08/25/16 1131 08/25/16 2024 08/26/16 0845 08/27/16 0540 08/28/16 0724 08/29/16 0402  WBC 17.9* 14.9* 19.3* 18.1* 18.8* 17.0*  NEUTROABS 16.3*  --   --   --   --   --   HGB 9.8* 8.6* 8.1* 8.8* 8.1* 8.3*  HCT 33.8*  29.9* 27.9* 31.2* 27.9* 29.3*  MCV 101.2* 101.7* 100.4* 101.6* 99.3 101.0*  PLT 447* 376 356 366 351 374   Blood Culture    Component Value Date/Time   SDES BLOOD RIGHT HAND 08/25/2016 1440   SPECREQUEST BOTTLES DRAWN AEROBIC AND ANAEROBIC 5CC 08/25/2016 1440   CULT (A) 08/25/2016 1440    STREPTOCOCCUS SALIVARIUS  SUSCEPTIBILITIES PERFORMED ON PREVIOUS CULTURE WITHIN THE LAST 5 DAYS.    REPTSTATUS 08/28/2016 FINAL 08/25/2016 1440    Cardiac Enzymes: No results for input(s): CKTOTAL, CKMB, CKMBINDEX, TROPONINI in the last 168 hours. CBG:  Recent Labs Lab 08/28/16 1304 08/28/16 1625 08/28/16 2244 08/29/16 0633 08/29/16 0724  GLUCAP 122* 150* 206* 100* 87   Iron Studies: No results for input(s): IRON, TIBC, TRANSFERRIN, FERRITIN in the last 72 hours. @lablastinr3 @ Studies/Results: No results found. Medications:   . acidophilus   Oral Daily  . aspirin EC  81 mg Oral Daily  . calcitRIOL  1.5 mcg Oral Q M,W,F-HD  . cefTRIAXone (ROCEPHIN)  IV  2 g Intravenous Q24H  . cinacalcet  60  mg Oral Daily  . DULoxetine  60 mg Oral Daily  . famotidine  20 mg Oral QHS  . feeding supplement  1 Container Oral TID WC  . gabapentin  100 mg Oral QHS  . glipiZIDE  2.5 mg Oral Q breakfast  . heparin  5,000 Units Subcutaneous Q8H  . Influenza vac split quadrivalent PF  0.5 mL Intramuscular Tomorrow-1000  . insulin aspart  0-9 Units Subcutaneous TID WC  . levothyroxine  75 mcg Oral QAC breakfast  . midodrine  10 mg Oral TID WC  . multivitamin  1 tablet Oral Daily  . nebivolol  2.5 mg Oral Daily  . pravastatin  40 mg Oral Daily  . senna  1 tablet Oral BID  . sodium chloride flush  3 mL Intravenous Q12H  . sodium chloride flush  3 mL Intravenous Q12H     Assessment/Plan: 1 Strep bacteremia/ flare of hidradenitis suppurativa - on Rocephin.  Blood cultures done 09/29 shows streptrococcus salivarius in anaerobic bottle only. Blood cultures x 2 done yesterday. Continue on ceftriaxone. WBC  17.0. Tmax 98.7.   2. ESRD MWF. HD 10/2 on schedule. No issues on HD. Next tx 10/04. K+ 3.9 use 4.0 K bath.  3. Volume - HD yesterday. Pre wt 157.5 Net UF 2500 Post wt 155. Pt is 7.5 kg under OP EDW which she wasn't getting to in HD center. Variability probably D/T bed wt. .  BP soft during HD but has been running low in hospital. Minimum UF tomorrow.  4. Anemia of ESRD:  HGB 8.3. Mircera 225 q 2 weeks (given 9/27). May need extra ESA dose. Follow HGB. Holding iron in setting of infection 5. 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 Calcitriol 1.50 uf. Dc'd binders wth low P 6. Nutrition: albumin 2.5. Renal/Carb mod diet/renal vit/prostat.  7. DM: per primary.    Rita H. Brown NP-C 08/29/2016, 9:29 AM  [facility] [phone]  Pt seen, examined and agree w A/P as above.  Kelly Splinter MD [employer] pager [phone]   08/29/2016, 12:09 PM

## 2016-08-29 NOTE — Progress Notes (Signed)
Pt refused her TEE and Xray as she would like to speak to the DR before any procedures and discuss what they are for. Notified MD [facility]

## 2016-08-29 NOTE — Progress Notes (Signed)
Pharmacy Antibiotic Note  [name] is a 55 y.o. female with ESRD on HD MWF, who suffers from chronic Hidradenitis suppurativa. She was admitted on 08/25/2016 with sepsis secondary to Hidradenitis, draining lesions. She has been on broad spectrum antibiotics but now is growing Streptoccoccus Salivarius from her blood in 2/2 cultures Intermediate to PCN but Sensitive to rocephin and vancomycin. Pharmacy has been consulted for Vancomycin IV dosing for Streptococcus salivarius bacteremia.  Continues on Rocephin. Patient is afebrile and WBC remains elevated at 17K.  Last received Vancomycin 1gm IV x1 on 9/29 @14 :31 Pt went to hemodialysis on 9/30 and 10/2. Last HD was 4.5h BFR 400, thus appropriate to give dose of Vancomycin tonight.  Next HD tx due 08/30/16.  Plan: Vancomycin 2 gm IV x1 then vancomycin 1gm IV after each HD qMWF.  Monitor pre-HD vancomycin level around 3rd or 4th dose Goal pre HD level =15-25 mcg/ml   Height: 5\' 6"  (167.6 cm) Weight: (!) 360 lb 3.7 oz (163.4 kg) IBW/kg (Calculated) : 59.3  Temp (24hrs), Avg:98.5 F (36.9 C), Min:98 F (36.7 C), Max:98.8 F (37.1 C)   Recent Labs Lab 08/25/16 1248 08/25/16 2024 08/26/16 0845 08/26/16 1309 08/26/16 1657 08/27/16 0540 08/28/16 0724 08/29/16 0402  WBC  --  14.9* 19.3*  --   --  18.1* 18.8* 17.0*  CREATININE  --  9.55* 10.42*  --   --  9.54* 11.15* 9.63*  LATICACIDVEN 2.75*  --   --  0.9 1.0  --   --   --     Estimated Creatinine Clearance: 10.6 mL/min (by C-G formula based on SCr of 9.63 mg/dL (H)).    Allergies  Allergen Reactions  . Rosiglitazone Maleate Swelling and Other (See Comments)    SWELLING REACTION UNSPECIFIED   . Amoxicillin Rash and Other (See Comments)    Tolerated Zosyn 01/2013. [place]  . Amoxicillin-Pot Clavulanate Diarrhea    Antimicrobials this admission: 9/29 Vancomycin  >> 9/30;  Restart 10/3>> 9/30 CTX >>   Dose adjustments this admission:   Microbiology results: 9/29 BCx  X2: S. Salivarius sensitive to CFTX, Levofloxacin, Penicillin, Vancomycin 10/2 BCx x2: ngtd 9/30  MRSA PCR: negativeo  Thank you for allowing pharmacy to be a part of this patient's care.  Nicole Cella, RPh Clinical Pharmacist Pager: [phone] 08/29/2016 8:31 PM   08/29/2016 7:40 PM

## 2016-08-29 NOTE — Progress Notes (Signed)
PROGRESS NOTE  [name] WFU:[id number] DOB: 06-26-1961 DOA: 08/25/2016 PCP: [name], MD  Hospital Course/Subjective: 55 yo AAF with ESRD on HD and recurrent infected hidradenitis with purulent discharge, lactic acidosis admitted 9/29. She was placed on empiric vancomycin and has refused surgical evaluation as she says she's had worsened lesions every time she has undergone surgery. Due to positive blood cultures, antibiotics were changed 9/30 to IV Rocephin. Now she is growing Strep Salivarius from 2/2 cultures from admission. Repeat blood cultures were drawn 10/2 and are pending.   Seen on the floor today with husband at bedside. No acute events overnight, tolerated HD well yesterday. No fevers or chills.   Assessment/Plan: Sepsis - due to infected hidradenitis. Continue present care with IV fluids (with caution due to HD dependence), empiric IV abx. Follow blood cultures. Has been afebrile. Note lactate 9/30 back to normal.  Bacteremia - 2/2 bottles with strep salivarus, repeat cx ordered 10/2, ID consulted Dr. Tommy Medal to see today. Recommends TEE and Panorex of teeth and these have been ordered.  Hidradenitis - treating with IV abx as above. Patient noted to have refused surgery evaluation. Will consult wound care, if not improving may need imaging or may need to insist on surgical consultation. White count is improving slowly and has been afebrile.  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 Improved sepsis physiology.  DM - continue home glipizide, SSI, DM diet. Last A1c was 5.3 in 2016.  Anemia of CKD - stable, monitor daily  DVT Prophylaxis: Heparin SQ  Code Status: FULL   Family Communication: None present.   Disposition Plan: Back home in 2-3 days.   Consultants:  Renal for HD   ID due to bacteremia  Procedures:  None   Antimicrobials:  Vancomycin 9/29-9/30  Rocephin 9/30-->     Objective: Vitals:   08/28/16 1307 08/28/16 2203 08/29/16 0500 08/29/16 0900  BP: (!) 97/47 (!) 86/45 (!) 89/40 (!) 88/54  Pulse: 73 63 68 68  Resp: 18 18 18 18   Temp: 98 F (36.7 C) 98.8 F (37.1 C) 98.7 F (37.1 C) 98.3 F (36.8 C)  TempSrc: Oral Oral Oral Oral  SpO2: 100% 97% 100% 100%  Weight:      Height:        Intake/Output Summary (Last 24 hours) at 08/29/16 1019 Last data filed at 08/29/16 0930  Gross per 24 hour  Intake              366 ml  Output             2500 ml  Net            -2134 ml   Filed Weights   08/26/16 2145 08/28/16 0709 08/28/16 1200  Weight: (!) 167.7 kg (369 lb 11.4 oz) (!) 157.5 kg (347 lb 3.6 oz) (!) 155 kg (341 lb 11.4 oz)     Exam: General:  Alert, oriented, calm, in no acute distress Neck: supple, no masses, trachea mildline  Cardiovascular: RRR, no murmurs or rubs, no peripheral edema  Respiratory: clear to auscultation bilaterally, no wheezes, no crackles  Abdomen: soft, nontender, nondistended, normal bowel tones heard, obese Skin: dry, no rashes (groin and buttock not examined) Musculoskeletal: no joint effusions, normal range of motion  Psychiatric: appropriate affect, normal speech  Neurologic: extraocular muscles intact, clear speech, moving all extremities with intact sensorium  Data Reviewed: CBC:  Recent Labs Lab 08/25/16 1131 08/25/16 2024 08/26/16 0845 08/27/16 0540 08/28/16 0724 08/29/16 0402  WBC 17.9* 14.9* 19.3* 18.1* 18.8* 17.0*  NEUTROABS 16.3*  --   --   --   --   --   HGB 9.8* 8.6* 8.1* 8.8* 8.1* 8.3*  HCT 33.8* 29.9* 27.9* 31.2* 27.9* 29.3*  MCV 101.2* 101.7* 100.4* 101.6* 99.3 101.0*  PLT 447* 376 356 366 351 063   Basic Metabolic Panel:  Recent Labs Lab 08/25/16 2024 08/26/16 0845 08/27/16 0540 08/28/16 0724 08/29/16 0402  NA 135 132* 137 135 134*  K 3.4* 3.6 3.9 3.7 3.9  CL 99* 98* 99* 99* 100*  CO2 23 21* 24 22 24   GLUCOSE 141* 184* 120* 123* 98  BUN 60* 66* 53* 68* 55*   CREATININE 9.55* 10.42* 9.54* 11.15* 9.63*  CALCIUM 8.3* 8.0* 8.5* 8.1* 8.1*  PHOS 1.7*  --   --   --   --    GFR: Estimated Creatinine Clearance: 10.3 mL/min (by C-G formula based on SCr of 9.63 mg/dL (H)). Liver Function Tests:  Recent Labs Lab 08/25/16 2024  ALBUMIN 2.5*   No results for input(s): LIPASE, AMYLASE in the last 168 hours. No results for input(s): AMMONIA in the last 168 hours. Coagulation Profile: No results for input(s): INR, PROTIME in the last 168 hours. Cardiac Enzymes: No results for input(s): CKTOTAL, CKMB, CKMBINDEX, TROPONINI in the last 168 hours. BNP (last 3 results) No results for input(s): PROBNP in the last 8760 hours. HbA1C: No results for input(s): HGBA1C in the last 72 hours. CBG:  Recent Labs Lab 08/28/16 1304 08/28/16 1625 08/28/16 2244 08/29/16 0633 08/29/16 0724  GLUCAP 122* 150* 206* 100* 87   Lipid Profile: No results for input(s): CHOL, HDL, LDLCALC, TRIG, CHOLHDL, LDLDIRECT in the last 72 hours. Thyroid Function Tests: No results for input(s): TSH, T4TOTAL, FREET4, T3FREE, THYROIDAB in the last 72 hours. Anemia Panel: No results for input(s): VITAMINB12, FOLATE, FERRITIN, TIBC, IRON, RETICCTPCT in the last 72 hours. Urine analysis:    Component Value Date/Time   COLORURINE YELLOW 07/18/2013 0831   APPEARANCEUR CLOUDY (A) 07/18/2013 0831   LABSPEC 1.012 07/18/2013 0831   PHURINE 5.0 07/18/2013 0831   GLUCOSEU NEGATIVE 07/18/2013 0831   HGBUR SMALL (A) 07/18/2013 0831   BILIRUBINUR NEGATIVE 07/18/2013 0831   KETONESUR NEGATIVE 07/18/2013 0831   PROTEINUR 30 (A) 07/18/2013 0831   UROBILINOGEN 0.2 07/18/2013 0831   NITRITE NEGATIVE 07/18/2013 0831   LEUKOCYTESUR MODERATE (A) 07/18/2013 0831   Sepsis Labs: @LABRCNTIP (procalcitonin:4,lacticidven:4)  ) Recent Results (from the past 240 hour(s))  Aerobic/Anaerobic Culture (surgical/deep wound)     Status: None (Preliminary result)   Collection Time: 08/25/16  1:53 PM   Result Value Ref Range Status   Specimen Description BUTTOCKS  Final   Special Requests Immunocompromised  Final   Gram Stain   Final    FEW WBC PRESENT, PREDOMINANTLY PMN ABUNDANT GRAM POSITIVE COCCI IN PAIRS IN CLUSTERS ABUNDANT GRAM NEGATIVE RODS MODERATE GRAM VARIABLE ROD    Culture NORMAL SKIN FLORA HOLDING FOR POSSIBLE ANAEROBE   Final   Report Status PENDING  Incomplete  Blood culture (routine x 2)     Status: Abnormal   Collection Time: 08/25/16  2:35 PM  Result Value Ref Range Status   Specimen Description BLOOD RIGHT ARM  Final   Special Requests BOTTLES DRAWN AEROBIC AND ANAEROBIC 5CC  Final   Culture  Setup Time   Final  GRAM POSITIVE COCCI IN CHAINS ANAEROBIC BOTTLE ONLY CRITICAL RESULT CALLED TO, READ BACK BY AND VERIFIED WITH: T STONE,PHARMD AT 1003 08/26/16 BY L BENFIELD    Culture STREPTOCOCCUS SALIVARIUS  (A)  Final   Report Status 08/28/2016 FINAL  Final   Organism ID, Bacteria STREPTOCOCCUS SALIVARIUS  Final      Susceptibility   Streptococcus salivarius - MIC*    ERYTHROMYCIN >=8 RESISTANT Resistant     LEVOFLOXACIN 1 SENSITIVE Sensitive     VANCOMYCIN 0.5 SENSITIVE Sensitive     * STREPTOCOCCUS SALIVARIUS   Blood Culture ID Panel (Reflexed)     Status: Abnormal   Collection Time: 08/25/16  2:35 PM  Result Value Ref Range Status   Enterococcus species NOT DETECTED NOT DETECTED Final   Listeria monocytogenes NOT DETECTED NOT DETECTED Final   Staphylococcus species NOT DETECTED NOT DETECTED Final   Staphylococcus aureus NOT DETECTED NOT DETECTED Final   Streptococcus species DETECTED (A) NOT DETECTED Final    Comment: CRITICAL RESULT CALLED TO, READ BACK BY AND VERIFIED WITH: T STONE,PHARMD AT 1003 08/26/16 BY L BENFIELD    Streptococcus agalactiae NOT DETECTED NOT DETECTED Final   Streptococcus pneumoniae NOT DETECTED NOT DETECTED Final   Streptococcus pyogenes NOT DETECTED NOT DETECTED Final   Acinetobacter baumannii NOT DETECTED NOT DETECTED Final    Enterobacteriaceae species NOT DETECTED NOT DETECTED Final   Enterobacter cloacae complex NOT DETECTED NOT DETECTED Final   Escherichia coli NOT DETECTED NOT DETECTED Final   Klebsiella oxytoca NOT DETECTED NOT DETECTED Final   Klebsiella pneumoniae NOT DETECTED NOT DETECTED Final   Proteus species NOT DETECTED NOT DETECTED Final   Serratia marcescens NOT DETECTED NOT DETECTED Final   Haemophilus influenzae NOT DETECTED NOT DETECTED Final   Neisseria meningitidis NOT DETECTED NOT DETECTED Final   Pseudomonas aeruginosa NOT DETECTED NOT DETECTED Final   Candida albicans NOT DETECTED NOT DETECTED Final   Candida glabrata NOT DETECTED NOT DETECTED Final   Candida krusei NOT DETECTED NOT DETECTED Final   Candida parapsilosis NOT DETECTED NOT DETECTED Final   Candida tropicalis NOT DETECTED NOT DETECTED Final  Blood culture (routine x 2)     Status: Abnormal   Collection Time: 08/25/16  2:40 PM  Result Value Ref Range Status   Specimen Description BLOOD RIGHT HAND  Final   Special Requests BOTTLES DRAWN AEROBIC AND ANAEROBIC 5CC  Final   Culture  Setup Time   Final    GRAM POSITIVE COCCI IN CHAINS ANAEROBIC BOTTLE ONLY CRITICAL VALUE NOTED.  VALUE IS CONSISTENT WITH PREVIOUSLY REPORTED AND CALLED VALUE.    Culture (A)  Final    STREPTOCOCCUS SALIVARIUS  SUSCEPTIBILITIES PERFORMED ON PREVIOUS CULTURE WITHIN THE LAST 5 DAYS.    Report Status 08/28/2016 FINAL  Final  MRSA PCR Screening     Status: None   Collection Time: 08/26/16  1:56 AM  Result Value Ref Range Status   MRSA by PCR NEGATIVE NEGATIVE Final    Comment:        The GeneXpert MRSA Assay (FDA approved for NASAL specimens only), is one component of a comprehensive MRSA colonization surveillance program. It is not intended to diagnose MRSA infection nor to guide or monitor treatment for MRSA infections.      Studies: No results found.  Scheduled Meds: . acidophilus   Oral Daily  . aspirin EC  81 mg Oral Daily   . calcitRIOL  1.5 mcg Oral Q M,W,F-HD  . cefTRIAXone (ROCEPHIN)  IV  2  g Intravenous Q24H  . cinacalcet  60 mg Oral Daily  . DULoxetine  60 mg Oral Daily  . famotidine  20 mg Oral QHS  . feeding supplement  1 Container Oral TID WC  . gabapentin  100 mg Oral QHS  . glipiZIDE  2.5 mg Oral Q breakfast  . heparin  5,000 Units Subcutaneous Q8H  . Influenza vac split quadrivalent PF  0.5 mL Intramuscular Tomorrow-1000  . insulin aspart  0-9 Units Subcutaneous TID WC  . levothyroxine  75 mcg Oral QAC breakfast  . midodrine  10 mg Oral TID WC  . multivitamin  1 tablet Oral Daily  . nebivolol  2.5 mg Oral Daily  . pravastatin  40 mg Oral Daily  . senna  1 tablet Oral BID  . sodium chloride flush  3 mL Intravenous Q12H  . sodium chloride flush  3 mL Intravenous Q12H    Continuous Infusions:    LOS: 4 days   Time spent: 28 minutes  Mir Marry Guan, MD [facility] Pager [phone]  If 7PM-7AM, please contact night-coverage [email or web address] Password [email or web address] 08/29/2016, 10:19 AM

## 2016-08-30 DIAGNOSIS — R6520 Severe sepsis without septic shock: Secondary | ICD-10-CM

## 2016-08-30 DIAGNOSIS — A408 Other streptococcal sepsis: Secondary | ICD-10-CM

## 2016-08-30 DIAGNOSIS — Z888 Allergy status to other drugs, medicaments and biological substances status: Secondary | ICD-10-CM

## 2016-08-30 DIAGNOSIS — L732 Hidradenitis suppurativa: Secondary | ICD-10-CM

## 2016-08-30 DIAGNOSIS — F17210 Nicotine dependence, cigarettes, uncomplicated: Secondary | ICD-10-CM

## 2016-08-30 DIAGNOSIS — E1101 Type 2 diabetes mellitus with hyperosmolarity with coma: Secondary | ICD-10-CM

## 2016-08-30 DIAGNOSIS — N186 End stage renal disease: Secondary | ICD-10-CM

## 2016-08-30 DIAGNOSIS — E1122 Type 2 diabetes mellitus with diabetic chronic kidney disease: Secondary | ICD-10-CM

## 2016-08-30 DIAGNOSIS — Z992 Dependence on renal dialysis: Secondary | ICD-10-CM

## 2016-08-30 DIAGNOSIS — Z6841 Body Mass Index (BMI) 40.0 and over, adult: Secondary | ICD-10-CM

## 2016-08-30 DIAGNOSIS — R7881 Bacteremia: Secondary | ICD-10-CM

## 2016-08-30 DIAGNOSIS — Z881 Allergy status to other antibiotic agents status: Secondary | ICD-10-CM

## 2016-08-30 LAB — BASIC METABOLIC PANEL
ANION GAP: 13 (ref 5–15)
BUN: 70 mg/dL — ABNORMAL HIGH (ref 6–20)
CO2: 21 mmol/L — AB (ref 22–32)
Calcium: 7.7 mg/dL — ABNORMAL LOW (ref 8.9–10.3)
Chloride: 100 mmol/L — ABNORMAL LOW (ref 101–111)
Creatinine, Ser: 11.59 mg/dL — ABNORMAL HIGH (ref 0.44–1.00)
GFR calc Af Amer: 4 mL/min — ABNORMAL LOW (ref >60)
GFR, EST NON AFRICAN AMERICAN: 3 mL/min — AB (ref >60)
GLUCOSE: 81 mg/dL (ref 65–99)
POTASSIUM: 4.4 mmol/L (ref 3.5–5.1)
Sodium: 134 mmol/L — ABNORMAL LOW (ref 135–145)

## 2016-08-30 LAB — GLUCOSE, CAPILLARY
GLUCOSE-CAPILLARY: 75 mg/dL (ref 65–99)
GLUCOSE-CAPILLARY: 151 mg/dL — AB (ref 65–99)
GLUCOSE-CAPILLARY: 130 mg/dL — AB (ref 65–99)

## 2016-08-30 LAB — CBC
HEMATOCRIT: 30.5 % — AB (ref 36.0–46.0)
HEMOGLOBIN: 8.8 g/dL — AB (ref 12.0–15.0)
MCH: 28.9 pg (ref 26.0–34.0)
MCHC: 28.9 g/dL — AB (ref 30.0–36.0)
MCV: 100.3 fL — ABNORMAL HIGH (ref 78.0–100.0)
PLATELETS: 351 10*3/uL (ref 150–400)
RBC: 3.04 MIL/uL — AB (ref 3.87–5.11)
RDW: 18.1 % — ABNORMAL HIGH (ref 11.5–15.5)
WBC: 17.7 10*3/uL — ABNORMAL HIGH (ref 4.0–10.5)

## 2016-08-30 LAB — AEROBIC/ANAEROBIC CULTURE (SURGICAL/DEEP WOUND): CULTURE: NORMAL

## 2016-08-30 LAB — AEROBIC/ANAEROBIC CULTURE W GRAM STAIN (SURGICAL/DEEP WOUND)

## 2016-08-30 MED ORDER — OXYCODONE-ACETAMINOPHEN 5-325 MG PO TABS
1.0000 | ORAL_TABLET | Freq: Four times a day (QID) | ORAL | Status: DC | PRN
  Filled 2016-08-30: qty 2

## 2016-08-30 MED ORDER — CALCITRIOL 0.5 MCG PO CAPS
ORAL_CAPSULE | ORAL | Status: AC
  Filled 2016-08-30: qty 3

## 2016-08-30 MED ORDER — VANCOMYCIN HCL IN DEXTROSE 1-5 GM/200ML-% IV SOLN
INTRAVENOUS | Status: AC
  Filled 2016-08-30: qty 200

## 2016-08-30 MED ORDER — MIDODRINE HCL 5 MG PO TABS
ORAL_TABLET | ORAL | Status: AC
  Filled 2016-08-30: qty 2

## 2016-08-30 NOTE — Progress Notes (Signed)
Subjective:  Patient does not want to do TTE, panorex   Antibiotics:  Anti-infectives    Start     Dose/Rate Route Frequency Ordered Stop   08/30/16 1200  vancomycin (VANCOCIN) IVPB 1000 mg/200 mL premix     1,000 mg 200 mL/hr over 60 Minutes Intravenous Every M-W-F (Hemodialysis) 08/29/16 2028     08/30/16 1130  vancomycin (VANCOCIN) 1-5 GM/200ML-% IVPB    Comments:  Cherylann Banas   : cabinet override      08/30/16 1130 08/30/16 1250   08/29/16 2200  metroNIDAZOLE (FLAGYL) tablet 500 mg     500 mg Oral Every 8 hours 08/29/16 1929     08/29/16 2130  vancomycin (VANCOCIN) 2,000 mg in sodium chloride 0.9 % 500 mL IVPB     2,000 mg 250 mL/hr over 120 Minutes Intravenous  Once 08/29/16 2028 08/29/16 2319   08/26/16 1030  cefTRIAXone (ROCEPHIN) 2 g in dextrose 5 % 50 mL IVPB     2 g 100 mL/hr over 30 Minutes Intravenous Every 24 hours 08/26/16 1021     08/25/16 2000  clindamycin (CLEOCIN) IVPB 600 mg  Status:  Discontinued     600 mg 100 mL/hr over 30 Minutes Intravenous Every 8 hours 08/25/16 1831 08/26/16 1021   08/25/16 1845  cefTAZidime (FORTAZ) 2 g in dextrose 5 % 50 mL IVPB     2 g 100 mL/hr over 30 Minutes Intravenous  Once 08/25/16 1819 08/25/16 2247   08/25/16 1330  vancomycin (VANCOCIN) IVPB 1000 mg/200 mL premix     1,000 mg 200 mL/hr over 60 Minutes Intravenous  Once 08/25/16 1320 08/25/16 1552      Medications: Scheduled Meds: . acidophilus   Oral Daily  . aspirin EC  81 mg Oral Daily  . calcitRIOL  1.5 mcg Oral Q M,W,F-HD  . cefTRIAXone (ROCEPHIN)  IV  2 g Intravenous Q24H  . cinacalcet  60 mg Oral Daily  . DULoxetine  60 mg Oral Daily  . famotidine  20 mg Oral QHS  . feeding supplement  1 Container Oral TID WC  . gabapentin  100 mg Oral QHS  . glipiZIDE  2.5 mg Oral Q breakfast  . heparin  5,000 Units Subcutaneous Q8H  . Influenza vac split quadrivalent PF  0.5 mL Intramuscular Tomorrow-1000  . insulin aspart  0-9 Units Subcutaneous TID WC  .  levothyroxine  75 mcg Oral QAC breakfast  . metroNIDAZOLE  500 mg Oral Q8H  . midodrine  10 mg Oral TID WC  . multivitamin  1 tablet Oral Daily  . nebivolol  2.5 mg Oral Daily  . pravastatin  40 mg Oral Daily  . senna  1 tablet Oral BID  . sodium chloride flush  3 mL Intravenous Q12H  . sodium chloride flush  3 mL Intravenous Q12H  . vancomycin  1,000 mg Intravenous Q M,W,F-HD   Continuous Infusions:  PRN Meds:.sodium chloride, sodium chloride, sodium chloride, acetaminophen **OR** acetaminophen, albuterol, alteplase, cyclobenzaprine, heparin, heparin, HYDROmorphone (DILAUDID) injection, lidocaine (PF), lidocaine-prilocaine, ondansetron **OR** ondansetron (ZOFRAN) IV, oxyCODONE-acetaminophen, pentafluoroprop-tetrafluoroeth, polyethylene glycol, promethazine, sodium chloride flush, traZODone    Objective: Weight change: 13 lb 0.1 oz (5.9 kg)  Intake/Output Summary (Last 24 hours) at 08/30/16 1928 Last data filed at 08/30/16 1800  Gross per 24 hour  Intake             1006 ml  Output  500 ml  Net              506 ml   Blood pressure (!) 117/50, pulse 67, temperature 97.5 F (36.4 C), temperature source Oral, resp. rate 20, height 5\' 6"  (1.676 m), weight (!) 363 lb 12.1 oz (165 kg), last menstrual period 06/13/2013, SpO2 100 %. Temp:  [97.3 F (36.3 C)-97.7 F (36.5 C)] 97.5 F (36.4 C) (10/04 1332) Pulse Rate:  [54-67] 67 (10/04 1640) Resp:  [20] 20 (10/04 1640) BP: (82-117)/(44-68) 117/50 (10/04 1640) SpO2:  [100 %] 100 % (10/04 1640) Weight:  [360 lb 7.2 oz (163.5 kg)-365 lb 8.4 oz (165.8 kg)] 363 lb 12.1 oz (165 kg) (10/04 1332)  Physical Exam:  Patient in rest room with Mom holding door. Pt vocalized to me from behind door re plan  CBC:  CBC Latest Ref Rng & Units 08/30/2016 08/29/2016 08/28/2016  WBC 4.0 - 10.5 K/uL 17.7(H) 17.0(H) 18.8(H)  Hemoglobin 12.0 - 15.0 g/dL 8.8(L) 8.3(L) 8.1(L)  Hematocrit 36.0 - 46.0 % 30.5(L) 29.3(L) 27.9(L)  Platelets 150 -  400 K/uL 351 374 351      BMET  Recent Labs  08/29/16 0402 08/30/16 0749  NA 134* 134*  K 3.9 4.4  CL 100* 100*  CO2 24 21*  GLUCOSE 98 81  BUN 55* 70*  CREATININE 9.63* 11.59*  CALCIUM 8.1* 7.7*     Liver Panel  No results for input(s): PROT, ALBUMIN, AST, ALT, ALKPHOS, BILITOT, BILIDIR, IBILI in the last 72 hours.     Sedimentation Rate No results for input(s): ESRSEDRATE in the last 72 hours. C-Reactive Protein No results for input(s): CRP in the last 72 hours.  Micro Results: Recent Results (from the past 720 hour(s))  Aerobic/Anaerobic Culture (surgical/deep wound)     Status: None   Collection Time: 08/25/16  1:53 PM  Result Value Ref Range Status   Specimen Description BUTTOCKS  Final   Special Requests Immunocompromised  Final   Gram Stain   Final    FEW WBC PRESENT, PREDOMINANTLY PMN ABUNDANT GRAM POSITIVE COCCI IN PAIRS IN CLUSTERS ABUNDANT GRAM NEGATIVE RODS MODERATE GRAM VARIABLE ROD    Culture   Final    NORMAL SKIN FLORA MIXED ANAEROBIC FLORA PRESENT.  CALL LAB IF FURTHER IID REQUIRED.    Report Status 08/30/2016 FINAL  Final  Blood culture (routine x 2)     Status: Abnormal   Collection Time: 08/25/16  2:35 PM  Result Value Ref Range Status   Specimen Description BLOOD RIGHT ARM  Final   Special Requests BOTTLES DRAWN AEROBIC AND ANAEROBIC 5CC  Final   Culture  Setup Time   Final    GRAM POSITIVE COCCI IN CHAINS ANAEROBIC BOTTLE ONLY CRITICAL RESULT CALLED TO, READ BACK BY AND VERIFIED WITH: T STONE,PHARMD AT 1003 08/26/16 BY L BENFIELD    Culture STREPTOCOCCUS SALIVARIUS (A)  Final   Report Status 08/29/2016 FINAL  Final   Organism ID, Bacteria STREPTOCOCCUS SALIVARIUS  Final      Susceptibility   Streptococcus salivarius - MIC*    PENICILLIN Value in next row Intermediate      INTERMEDIATE0.25 ;0.25    CEFTRIAXONE Value in next row Sensitive      SENSITIVE0.25    ERYTHROMYCIN Value in next row Resistant      SENSITIVE0.25     LEVOFLOXACIN Value in next row Sensitive      SENSITIVE0.25    VANCOMYCIN Value in next row Sensitive  SENSITIVE0.25    * STREPTOCOCCUS SALIVARIUS  Blood Culture ID Panel (Reflexed)     Status: Abnormal   Collection Time: 08/25/16  2:35 PM  Result Value Ref Range Status   Enterococcus species NOT DETECTED NOT DETECTED Final   Listeria monocytogenes NOT DETECTED NOT DETECTED Final   Staphylococcus species NOT DETECTED NOT DETECTED Final   Staphylococcus aureus NOT DETECTED NOT DETECTED Final   Streptococcus species DETECTED (A) NOT DETECTED Final    Comment: CRITICAL RESULT CALLED TO, READ BACK BY AND VERIFIED WITH: T STONE,PHARMD AT 1003 08/26/16 BY L BENFIELD    Streptococcus agalactiae NOT DETECTED NOT DETECTED Final   Streptococcus pneumoniae NOT DETECTED NOT DETECTED Final   Streptococcus pyogenes NOT DETECTED NOT DETECTED Final   Acinetobacter baumannii NOT DETECTED NOT DETECTED Final   Enterobacteriaceae species NOT DETECTED NOT DETECTED Final   Enterobacter cloacae complex NOT DETECTED NOT DETECTED Final   Escherichia coli NOT DETECTED NOT DETECTED Final   Klebsiella oxytoca NOT DETECTED NOT DETECTED Final   Klebsiella pneumoniae NOT DETECTED NOT DETECTED Final   Proteus species NOT DETECTED NOT DETECTED Final   Serratia marcescens NOT DETECTED NOT DETECTED Final   Haemophilus influenzae NOT DETECTED NOT DETECTED Final   Neisseria meningitidis NOT DETECTED NOT DETECTED Final   Pseudomonas aeruginosa NOT DETECTED NOT DETECTED Final   Candida albicans NOT DETECTED NOT DETECTED Final   Candida glabrata NOT DETECTED NOT DETECTED Final   Candida krusei NOT DETECTED NOT DETECTED Final   Candida parapsilosis NOT DETECTED NOT DETECTED Final   Candida tropicalis NOT DETECTED NOT DETECTED Final  Blood culture (routine x 2)     Status: Abnormal   Collection Time: 08/25/16  2:40 PM  Result Value Ref Range Status   Specimen Description BLOOD RIGHT HAND  Final   Special Requests  BOTTLES DRAWN AEROBIC AND ANAEROBIC 5CC  Final   Culture  Setup Time   Final    GRAM POSITIVE COCCI IN CHAINS ANAEROBIC BOTTLE ONLY CRITICAL VALUE NOTED.  VALUE IS CONSISTENT WITH PREVIOUSLY REPORTED AND CALLED VALUE.    Culture (A)  Final    STREPTOCOCCUS SALIVARIUS  SUSCEPTIBILITIES PERFORMED ON PREVIOUS CULTURE WITHIN THE LAST 5 DAYS.    Report Status 08/28/2016 FINAL  Final  MRSA PCR Screening     Status: None   Collection Time: 08/26/16  1:56 AM  Result Value Ref Range Status   MRSA by PCR NEGATIVE NEGATIVE Final    Comment:        The GeneXpert MRSA Assay (FDA approved for NASAL specimens only), is one component of a comprehensive MRSA colonization surveillance program. It is not intended to diagnose MRSA infection nor to guide or monitor treatment for MRSA infections.   Culture, blood (routine x 2)     Status: None (Preliminary result)   Collection Time: 08/28/16  2:25 PM  Result Value Ref Range Status   Specimen Description BLOOD RIGHT HAND  Final   Special Requests IN PEDIATRIC BOTTLE 0.5ML  Final   Culture NO GROWTH 2 DAYS  Final   Report Status PENDING  Incomplete  Culture, blood (routine x 2)     Status: None (Preliminary result)   Collection Time: 08/28/16  2:29 PM  Result Value Ref Range Status   Specimen Description BLOOD RIGHT HAND  Final   Special Requests IN PEDIATRIC BOTTLE 0.5 ML  Final   Culture NO GROWTH 2 DAYS  Final   Report Status PENDING  Incomplete    Studies/Results: No results  found.    Assessment/Plan:  INTERVAL HISTORY:  Repeat blood cx NGTD   Principal Problem:   Sepsis affecting skin ([place]) Active Problems:   Morbid obesity ([place])   DM type 2 (diabetes mellitus, type 2) ([place])   Hypertension   Hydradenitis   ESRD (end stage renal disease) on dialysis ([place])   Bacteremia   Streptococcal bacteremia    [name] is a 55 y.o. female with  Severe HS, morbid obesity, DM, ESRD on HD with admission with sepsis draining purulent  HS lesions and now Streptococcus salvarius  #1 Streptococcus salivarius bacteremia:  Her blood cultures appear to be clearing  She is refusing panorex, TTE  I will give her presumptive treatment for endocarditis with   Vancomycin dosed with HD x 4 weeks  #2 Severe HS: continue flagyl 500mg  TID x 2 weeks, rocephin while in house  I am happy to see her in followup after DC in the next 4 weeks  I will otherwise sign off for now   Please call with further questions.       LOS: 5 days   Alcide Evener 08/30/2016, 7:28 PM

## 2016-08-30 NOTE — Progress Notes (Signed)
PROGRESS NOTE    [name]  KYH:[id number] DOB: 1961/11/24 DOA: 08/25/2016  PCP: [name], MD   Brief Narrative:  55 yo AAF with ESRD on HD and recurrent infected hidradenitis with purulent discharge, lactic acidosis admitted 9/29. She was placed on empiric vancomycin and has refused surgical evaluation as she says she's had worsened lesions every time she has undergone surgery. Due to positive blood cultures, antibiotics were changed 9/30 to IV Rocephin. Now she is growing Strep Salivarius from 2/2 cultures from admission. Repeat blood cultures were drawn 10/2 and are pending.   Subjective: No complaints. Does not want 2 D ECHO today as she has not yet taken a shower.   Assessment & Plan:   Principal Problem:   Sepsis  - due to Hidradenitis and Streptococcus Salivarius bacteremia - as this is found in the oral cavity, panorex recommended but she has- she has declined it  --has multiple fistulating tracts in perneal and gluteal area with purulent discharge and blood- has declined surgical consult - cont Vanc x 4 wks on dialysis and Flagyl x 2 wks per ID- d/c Rocephin when discharged    - awaiting TTE- TEE not recommended by ID as she would not be a candidate for CVTS - Wound care eval complete- following wound care recommended : Cleanse hydradenitis lesions with NS and pat gently dry.  Apply calcium alginate to open wounds for absorption, after laying down a nonadherent contact layer to wound bed for atraumatic dressing removal.Cover with 4x4 gauze and ABD pads/tape.  Change daily and PRN soilage.  Mepitel silicone contact layer (LAWSON # [id number]) Calcium alginate (LAWSON # [id number])  Active Problems:   Morbid obesity  Body mass index is 58.71 kg/m.    DM type 2 (diabetes mellitus, type 2) - sugars table - A1c 5.3- cont Glipizide, SSI   ESRD (end stage renal disease) on dialysis  -appreciate nephrology f/u   Anemia of CKD - stable   DVT prophylaxis: Heparin Code Status:  Full code Family Communication:  Disposition Plan: home in 1-2 days Consultants:   Nephrology  ID Procedures:     Antimicrobials:  Anti-infectives    Start     Dose/Rate Route Frequency Ordered Stop   08/30/16 1200  vancomycin (VANCOCIN) IVPB 1000 mg/200 mL premix     1,000 mg 200 mL/hr over 60 Minutes Intravenous Every M-W-F (Hemodialysis) 08/29/16 2028     08/30/16 1130  vancomycin (VANCOCIN) 1-5 GM/200ML-% IVPB    Comments:  Cherylann Banas   : cabinet override      08/30/16 1130 08/30/16 1250   08/29/16 2200  metroNIDAZOLE (FLAGYL) tablet 500 mg     500 mg Oral Every 8 hours 08/29/16 1929     08/29/16 2130  vancomycin (VANCOCIN) 2,000 mg in sodium chloride 0.9 % 500 mL IVPB     2,000 mg 250 mL/hr over 120 Minutes Intravenous  Once 08/29/16 2028 08/29/16 2319   08/26/16 1030  cefTRIAXone (ROCEPHIN) 2 g in dextrose 5 % 50 mL IVPB     2 g 100 mL/hr over 30 Minutes Intravenous Every 24 hours 08/26/16 1021     08/25/16 2000  clindamycin (CLEOCIN) IVPB 600 mg  Status:  Discontinued     600 mg 100 mL/hr over 30 Minutes Intravenous Every 8 hours 08/25/16 1831 08/26/16 1021   08/25/16 1845  cefTAZidime (FORTAZ) 2 g in dextrose 5 % 50 mL IVPB     2 g 100 mL/hr over 30 Minutes Intravenous  Once 08/25/16  1819 08/25/16 2247   08/25/16 1330  vancomycin (VANCOCIN) IVPB 1000 mg/200 mL premix     1,000 mg 200 mL/hr over 60 Minutes Intravenous  Once 08/25/16 1320 08/25/16 1552       Objective: Vitals:   08/30/16 1230 08/30/16 1300 08/30/16 1332 08/30/16 1640  BP: (!) 87/51 (!) 92/54 (!) 96/54 (!) 117/50  Pulse: (!) 54 (!) 54 (!) 58 67  Resp:   20 20  Temp:   97.5 F (36.4 C)   TempSrc:   Oral   SpO2:   100% 100%  Weight:   (!) 165 kg (363 lb 12.1 oz)   Height:        Intake/Output Summary (Last 24 hours) at 08/30/16 1712 Last data filed at 08/30/16 1655  Gross per 24 hour  Intake              603 ml  Output              500 ml  Net              103 ml   Filed Weights    08/29/16 2048 08/30/16 0932 08/30/16 1332  Weight: (!) 163.5 kg (360 lb 7.2 oz) (!) 165.8 kg (365 lb 8.4 oz) (!) 165 kg (363 lb 12.1 oz)    Examination: General exam: Appears comfortable  HEENT: PERRLA, oral mucosa moist, no sclera icterus or thrush Respiratory system: Clear to auscultation. Respiratory effort normal. Cardiovascular system: S1 & S2 heard, RRR.  No murmurs  Gastrointestinal system: Abdomen soft, non-tender, nondistended. Normal bowel sound. No organomegaly Central nervous system: Alert and oriented. No focal neurological deficits. Extremities: No cyanosis, clubbing or edema Skin: numerous draining ulcers on inner thighs, pannus Psychiatry:  Mood & affect appropriate.     Data Reviewed: I have personally reviewed following labs and imaging studies  CBC:  Recent Labs Lab 08/25/16 1131  08/26/16 0845 08/27/16 0540 08/28/16 0724 08/29/16 0402 08/30/16 0749  WBC 17.9*  < > 19.3* 18.1* 18.8* 17.0* 17.7*  NEUTROABS 16.3*  --   --   --   --   --   --   HGB 9.8*  < > 8.1* 8.8* 8.1* 8.3* 8.8*  HCT 33.8*  < > 27.9* 31.2* 27.9* 29.3* 30.5*  MCV 101.2*  < > 100.4* 101.6* 99.3 101.0* 100.3*  PLT 447*  < > 356 366 351 374 351  < > = values in this interval not displayed. Basic Metabolic Panel:  Recent Labs Lab 08/25/16 2024 08/26/16 0845 08/27/16 0540 08/28/16 0724 08/29/16 0402 08/30/16 0749  NA 135 132* 137 135 134* 134*  K 3.4* 3.6 3.9 3.7 3.9 4.4  CL 99* 98* 99* 99* 100* 100*  CO2 23 21* 24 22 24  21*  GLUCOSE 141* 184* 120* 123* 98 81  BUN 60* 66* 53* 68* 55* 70*  CREATININE 9.55* 10.42* 9.54* 11.15* 9.63* 11.59*  CALCIUM 8.3* 8.0* 8.5* 8.1* 8.1* 7.7*  PHOS 1.7*  --   --   --   --   --    GFR: Estimated Creatinine Clearance: 8.9 mL/min (by C-G formula based on SCr of 11.59 mg/dL (H)). Liver Function Tests:  Recent Labs Lab 08/25/16 2024  ALBUMIN 2.5*   No results for input(s): LIPASE, AMYLASE in the last 168 hours. No results for input(s):  AMMONIA in the last 168 hours. Coagulation Profile: No results for input(s): INR, PROTIME in the last 168 hours. Cardiac Enzymes: No results for input(s): CKTOTAL, CKMB, CKMBINDEX,  TROPONINI in the last 168 hours. BNP (last 3 results) No results for input(s): PROBNP in the last 8760 hours. HbA1C: No results for input(s): HGBA1C in the last 72 hours. CBG:  Recent Labs Lab 08/29/16 1203 08/29/16 1617 08/29/16 2053 08/30/16 0726 08/30/16 1604  GLUCAP 90 137* 154* 75 151*   Lipid Profile: No results for input(s): CHOL, HDL, LDLCALC, TRIG, CHOLHDL, LDLDIRECT in the last 72 hours. Thyroid Function Tests: No results for input(s): TSH, T4TOTAL, FREET4, T3FREE, THYROIDAB in the last 72 hours. Anemia Panel: No results for input(s): VITAMINB12, FOLATE, FERRITIN, TIBC, IRON, RETICCTPCT in the last 72 hours. Urine analysis:    Component Value Date/Time   COLORURINE YELLOW 07/18/2013 0831   APPEARANCEUR CLOUDY (A) 07/18/2013 0831   LABSPEC 1.012 07/18/2013 0831   PHURINE 5.0 07/18/2013 0831   GLUCOSEU NEGATIVE 07/18/2013 0831   HGBUR SMALL (A) 07/18/2013 0831   BILIRUBINUR NEGATIVE 07/18/2013 0831   KETONESUR NEGATIVE 07/18/2013 0831   PROTEINUR 30 (A) 07/18/2013 0831   UROBILINOGEN 0.2 07/18/2013 0831   NITRITE NEGATIVE 07/18/2013 0831   LEUKOCYTESUR MODERATE (A) 07/18/2013 0831   Sepsis Labs: @LABRCNTIP (procalcitonin:4,lacticidven:4) ) Recent Results (from the past 240 hour(s))  Aerobic/Anaerobic Culture (surgical/deep wound)     Status: None   Collection Time: 08/25/16  1:53 PM  Result Value Ref Range Status   Specimen Description BUTTOCKS  Final   Special Requests Immunocompromised  Final   Gram Stain   Final    FEW WBC PRESENT, PREDOMINANTLY PMN ABUNDANT GRAM POSITIVE COCCI IN PAIRS IN CLUSTERS ABUNDANT GRAM NEGATIVE RODS MODERATE GRAM VARIABLE ROD    Culture   Final    NORMAL SKIN FLORA MIXED ANAEROBIC FLORA PRESENT.  CALL LAB IF FURTHER IID REQUIRED.    Report  Status 08/30/2016 FINAL  Final  Blood culture (routine x 2)     Status: Abnormal   Collection Time: 08/25/16  2:35 PM  Result Value Ref Range Status   Specimen Description BLOOD RIGHT ARM  Final   Special Requests BOTTLES DRAWN AEROBIC AND ANAEROBIC 5CC  Final   Culture  Setup Time   Final    GRAM POSITIVE COCCI IN CHAINS ANAEROBIC BOTTLE ONLY CRITICAL RESULT CALLED TO, READ BACK BY AND VERIFIED WITH: T STONE,PHARMD AT 1003 08/26/16 BY L BENFIELD    Culture STREPTOCOCCUS SALIVARIUS (A)  Final   Report Status 08/29/2016 FINAL  Final   Organism ID, Bacteria STREPTOCOCCUS SALIVARIUS  Final      Susceptibility   Streptococcus salivarius - MIC*    PENICILLIN Value in next row Intermediate      INTERMEDIATE0.25 ;0.25    CEFTRIAXONE Value in next row Sensitive      SENSITIVE0.25    ERYTHROMYCIN Value in next row Resistant      SENSITIVE0.25    LEVOFLOXACIN Value in next row Sensitive      SENSITIVE0.25    VANCOMYCIN Value in next row Sensitive      SENSITIVE0.25    * STREPTOCOCCUS SALIVARIUS  Blood Culture ID Panel (Reflexed)     Status: Abnormal   Collection Time: 08/25/16  2:35 PM  Result Value Ref Range Status   Enterococcus species NOT DETECTED NOT DETECTED Final   Listeria monocytogenes NOT DETECTED NOT DETECTED Final   Staphylococcus species NOT DETECTED NOT DETECTED Final   Staphylococcus aureus NOT DETECTED NOT DETECTED Final   Streptococcus species DETECTED (A) NOT DETECTED Final    Comment: CRITICAL RESULT CALLED TO, READ BACK BY AND VERIFIED WITH: T STONE,PHARMD AT  1003 08/26/16 BY L BENFIELD    Streptococcus agalactiae NOT DETECTED NOT DETECTED Final   Streptococcus pneumoniae NOT DETECTED NOT DETECTED Final   Streptococcus pyogenes NOT DETECTED NOT DETECTED Final   Acinetobacter baumannii NOT DETECTED NOT DETECTED Final   Enterobacteriaceae species NOT DETECTED NOT DETECTED Final   Enterobacter cloacae complex NOT DETECTED NOT DETECTED Final   Escherichia coli NOT  DETECTED NOT DETECTED Final   Klebsiella oxytoca NOT DETECTED NOT DETECTED Final   Klebsiella pneumoniae NOT DETECTED NOT DETECTED Final   Proteus species NOT DETECTED NOT DETECTED Final   Serratia marcescens NOT DETECTED NOT DETECTED Final   Haemophilus influenzae NOT DETECTED NOT DETECTED Final   Neisseria meningitidis NOT DETECTED NOT DETECTED Final   Pseudomonas aeruginosa NOT DETECTED NOT DETECTED Final   Candida albicans NOT DETECTED NOT DETECTED Final   Candida glabrata NOT DETECTED NOT DETECTED Final   Candida krusei NOT DETECTED NOT DETECTED Final   Candida parapsilosis NOT DETECTED NOT DETECTED Final   Candida tropicalis NOT DETECTED NOT DETECTED Final  Blood culture (routine x 2)     Status: Abnormal   Collection Time: 08/25/16  2:40 PM  Result Value Ref Range Status   Specimen Description BLOOD RIGHT HAND  Final   Special Requests BOTTLES DRAWN AEROBIC AND ANAEROBIC 5CC  Final   Culture  Setup Time   Final    GRAM POSITIVE COCCI IN CHAINS ANAEROBIC BOTTLE ONLY CRITICAL VALUE NOTED.  VALUE IS CONSISTENT WITH PREVIOUSLY REPORTED AND CALLED VALUE.    Culture (A)  Final    STREPTOCOCCUS SALIVARIUS  SUSCEPTIBILITIES PERFORMED ON PREVIOUS CULTURE WITHIN THE LAST 5 DAYS.    Report Status 08/28/2016 FINAL  Final  MRSA PCR Screening     Status: None   Collection Time: 08/26/16  1:56 AM  Result Value Ref Range Status   MRSA by PCR NEGATIVE NEGATIVE Final    Comment:        The GeneXpert MRSA Assay (FDA approved for NASAL specimens only), is one component of a comprehensive MRSA colonization surveillance program. It is not intended to diagnose MRSA infection nor to guide or monitor treatment for MRSA infections.   Culture, blood (routine x 2)     Status: None (Preliminary result)   Collection Time: 08/28/16  2:25 PM  Result Value Ref Range Status   Specimen Description BLOOD RIGHT HAND  Final   Special Requests IN PEDIATRIC BOTTLE 0.5ML  Final   Culture NO GROWTH 2  DAYS  Final   Report Status PENDING  Incomplete  Culture, blood (routine x 2)     Status: None (Preliminary result)   Collection Time: 08/28/16  2:29 PM  Result Value Ref Range Status   Specimen Description BLOOD RIGHT HAND  Final   Special Requests IN PEDIATRIC BOTTLE 0.5 ML  Final   Culture NO GROWTH 2 DAYS  Final   Report Status PENDING  Incomplete         Radiology Studies: No results found.    Scheduled Meds: . acidophilus   Oral Daily  . aspirin EC  81 mg Oral Daily  . calcitRIOL  1.5 mcg Oral Q M,W,F-HD  . cefTRIAXone (ROCEPHIN)  IV  2 g Intravenous Q24H  . cinacalcet  60 mg Oral Daily  . DULoxetine  60 mg Oral Daily  . famotidine  20 mg Oral QHS  . feeding supplement  1 Container Oral TID WC  . gabapentin  100 mg Oral QHS  . glipiZIDE  2.5  mg Oral Q breakfast  . heparin  5,000 Units Subcutaneous Q8H  . Influenza vac split quadrivalent PF  0.5 mL Intramuscular Tomorrow-1000  . insulin aspart  0-9 Units Subcutaneous TID WC  . levothyroxine  75 mcg Oral QAC breakfast  . metroNIDAZOLE  500 mg Oral Q8H  . midodrine  10 mg Oral TID WC  . multivitamin  1 tablet Oral Daily  . nebivolol  2.5 mg Oral Daily  . pravastatin  40 mg Oral Daily  . senna  1 tablet Oral BID  . sodium chloride flush  3 mL Intravenous Q12H  . sodium chloride flush  3 mL Intravenous Q12H  . vancomycin  1,000 mg Intravenous Q M,W,F-HD   Continuous Infusions:    LOS: 5 days    Time spent in minutes: [place], MD [facility] Pager: [email or web address] Password [email or web address] 08/30/2016, 5:12 PM

## 2016-08-30 NOTE — Progress Notes (Addendum)
[facility] Progress Note   Subjective:  "I'm ok today." Wound pain improved. Denies fever or chills. Denies CP or worsened dyspnea.   Objective Vitals:   08/29/16 1237 08/29/16 1653 08/29/16 2048 08/30/16 0452  BP:  (!) 90/48 (!) 103/52 99/68  Pulse:  66 63 65  Resp:  20 20 20   Temp:  98 F (36.7 C) 97.6 F (36.4 C) 97.7 F (36.5 C)  TempSrc:  Oral Oral Oral  SpO2:  100% 100% 100%  Weight: (!) 163.4 kg (360 lb 3.7 oz)  (!) 163.5 kg (360 lb 7.2 oz)   Height:       Physical Exam General: Well nourished, NAD Heart: Exam limited by habitus. RRR; no murmur appreciated. Lungs: Faint wheezing in B upper lobes; clear in B lower lobes Abdomen: Obese; non-tender Extremities: No LE edema. Skin: Wounds covered in bandages; not examined today. Dialysis Access: LUE AVF + thrill/bruit  Dialysis Orders: [facility]  4 hrs 30 min, EDW 162.5 kg, 2K / 2.5 Ca bath, BFR 500/DFR800 - Heparin 5000 units initial bolus, 5000 unit mid-run bolus - Venofer 50mg  IV q weekly - Mircera 225 mcg IV q 2 weeks (last given 08/23/16) - Calcitriol 1.50 mcg PO q HD  Additional Objective Labs: Basic Metabolic Panel:  Recent Labs Lab 08/25/16 2024  08/28/16 0724 08/29/16 0402 08/30/16 0749  NA 135  < > 135 134* 134*  K 3.4*  < > 3.7 3.9 4.4  CL 99*  < > 99* 100* 100*  CO2 23  < > 22 24 21*  GLUCOSE 141*  < > 123* 98 81  BUN 60*  < > 68* 55* 70*  CREATININE 9.55*  < > 11.15* 9.63* 11.59*  CALCIUM 8.3*  < > 8.1* 8.1* 7.7*  PHOS 1.7*  --   --   --   --   < > = values in this interval not displayed. Liver Function Tests:  Recent Labs Lab 08/25/16 2024  ALBUMIN 2.5*   No results for input(s): LIPASE, AMYLASE in the last 168 hours. CBC:  Recent Labs Lab 08/25/16 1131  08/26/16 0845 08/27/16 0540 08/28/16 0724 08/29/16 0402 08/30/16 0749  WBC 17.9*  < > 19.3* 18.1* 18.8* 17.0* 17.7*  NEUTROABS 16.3*  --   --   --   --   --   --   HGB 9.8*  < > 8.1* 8.8* 8.1* 8.3* 8.8*   HCT 33.8*  < > 27.9* 31.2* 27.9* 29.3* 30.5*  MCV 101.2*  < > 100.4* 101.6* 99.3 101.0* 100.3*  PLT 447*  < > 356 366 351 374 351  < > = values in this interval not displayed. Blood Culture    Component Value Date/Time   SDES BLOOD RIGHT HAND 08/28/2016 1429   SPECREQUEST IN PEDIATRIC BOTTLE 0.5 ML 08/28/2016 1429   CULT NO GROWTH < 24 HOURS 08/28/2016 1429   REPTSTATUS PENDING 08/28/2016 1429   CBG:  Recent Labs Lab 08/29/16 0724 08/29/16 1203 08/29/16 1617 08/29/16 2053 08/30/16 0726  GLUCAP 87 90 137* 154* 75   Studies/Results: No results found. Medications:   . acidophilus   Oral Daily  . aspirin EC  81 mg Oral Daily  . calcitRIOL  1.5 mcg Oral Q M,W,F-HD  . cefTRIAXone (ROCEPHIN)  IV  2 g Intravenous Q24H  . cinacalcet  60 mg Oral Daily  . DULoxetine  60 mg Oral Daily  . famotidine  20 mg Oral QHS  . feeding supplement  1 Container  Oral TID WC  . gabapentin  100 mg Oral QHS  . glipiZIDE  2.5 mg Oral Q breakfast  . heparin  5,000 Units Subcutaneous Q8H  . Influenza vac split quadrivalent PF  0.5 mL Intramuscular Tomorrow-1000  . insulin aspart  0-9 Units Subcutaneous TID WC  . levothyroxine  75 mcg Oral QAC breakfast  . metroNIDAZOLE  500 mg Oral Q8H  . midodrine  10 mg Oral TID WC  . multivitamin  1 tablet Oral Daily  . nebivolol  2.5 mg Oral Daily  . pravastatin  40 mg Oral Daily  . senna  1 tablet Oral BID  . sodium chloride flush  3 mL Intravenous Q12H  . sodium chloride flush  3 mL Intravenous Q12H  . vancomycin  1,000 mg Intravenous Q M,W,F-HD     Assessment/Plan: 1.  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 Has been on Ceftriaxone. ID involved; will now be on IV Vanc w HD for total 4 weeks, and may go home on po Flagyl as well for a shorter time. TTE pending. 2.  ESRD: Continue MWF schedule. K 4.4, 3K bath.  3.  Hypotension/Volume: BP chronically low;  on midodrine TID. Bed weights off, post-HD weight 155kg after HD 10/2  (7kg below EDW). Low UF goal today; monitor. K 4.4, 3K bath. 4. Anemia:  HGB 8.8; improving. On Mircera 225 q 2 weeks (last given 9/27). Follow Hgb. Holding IV iron in setting of  infection. 5. Metabolic Bone Disease: Last Phos 1.7; binders held (velphoro 2/meals, Phoslo 3/meals). Corr Ca ok. Continue  Sensipar/VDRA for now. Will recheck Phos. 6. Nutrition: Albumin 2.5. Continue Renal/Carb mod diet/renal vit/prostat.  7.  DM: per primary.  8. Dispo - ok for dc from renal standpoint   [name], PA-C  08/30/2016 9:37 AM [facility] Pager: [phone]   Pt seen, examined, agree w assess/plan as above with additions as indicated. ID has decided on IV VAnc for 4 wks as empiric Rx for poss endocarditis.  TTE pending, will not need TEE as not likely candidate for valve replacement surgery.  ID also suggested attention to possibility of bariatric surgery.  Kelly Splinter MD [facility] pager [phone]    cell [phone] 08/30/2016, 9:48 AM

## 2016-08-31 ENCOUNTER — Inpatient Hospital Stay (HOSPITAL_COMMUNITY): Payer: Medicare Other

## 2016-08-31 DIAGNOSIS — R7881 Bacteremia: Secondary | ICD-10-CM

## 2016-08-31 LAB — BASIC METABOLIC PANEL
Anion gap: 17 — ABNORMAL HIGH (ref 5–15)
BUN: 57 mg/dL — AB (ref 6–20)
CO2: 21 mmol/L — ABNORMAL LOW (ref 22–32)
CREATININE: 10.19 mg/dL — AB (ref 0.44–1.00)
Calcium: 7.6 mg/dL — ABNORMAL LOW (ref 8.9–10.3)
Chloride: 97 mmol/L — ABNORMAL LOW (ref 101–111)
GFR calc Af Amer: 4 mL/min — ABNORMAL LOW (ref >60)
GFR, EST NON AFRICAN AMERICAN: 4 mL/min — AB (ref >60)
Glucose, Bld: 83 mg/dL (ref 65–99)
Potassium: 4.3 mmol/L (ref 3.5–5.1)
SODIUM: 135 mmol/L (ref 135–145)

## 2016-08-31 LAB — CBC
HCT: 29 % — ABNORMAL LOW (ref 36.0–46.0)
Hemoglobin: 8.2 g/dL — ABNORMAL LOW (ref 12.0–15.0)
MCH: 28.5 pg (ref 26.0–34.0)
MCHC: 28.3 g/dL — ABNORMAL LOW (ref 30.0–36.0)
MCV: 100.7 fL — AB (ref 78.0–100.0)
PLATELETS: 336 10*3/uL (ref 150–400)
RBC: 2.88 MIL/uL — ABNORMAL LOW (ref 3.87–5.11)
RDW: 17.9 % — AB (ref 11.5–15.5)
WBC: 17.5 10*3/uL — AB (ref 4.0–10.5)

## 2016-08-31 LAB — ECHOCARDIOGRAM COMPLETE
HEIGHTINCHES: 66 in
Weight: 5873.05 oz

## 2016-08-31 LAB — GLUCOSE, CAPILLARY
Glucose-Capillary: 87 mg/dL (ref 65–99)
Glucose-Capillary: 103 mg/dL — ABNORMAL HIGH (ref 65–99)

## 2016-08-31 MED ORDER — GLIPIZIDE 5 MG PO TABS: 2.5000 mg | ORAL_TABLET | Freq: Every day | ORAL | 0 refills | Status: AC

## 2016-08-31 MED ORDER — MIDODRINE HCL 10 MG PO TABS: 10.0000 mg | ORAL_TABLET | Freq: Three times a day (TID) | ORAL | 0 refills | Status: AC

## 2016-08-31 MED ORDER — METRONIDAZOLE 500 MG PO TABS: 500.0000 mg | ORAL_TABLET | Freq: Three times a day (TID) | ORAL | 0 refills | Status: DC

## 2016-08-31 NOTE — Discharge Summary (Signed)
Physician Discharge Summary  [name] YIF:[id number] DOB: 1961/03/11 DOA: 08/25/2016  PCP: [name], MD  Admit date: 08/25/2016 Discharge date: 08/31/2016  Admitted From: home Disposition:  home   Recommendations for Outpatient Follow-up:  1. Stop date for Vancomycin 10/31   Home Health:  RN for wound care Equipment/Devices:  none    Discharge Condition:  stable  CODE STATUS:  Full code   Diet recommendation:  Renal diet Consultations:  Nephrology  ID    Discharge Diagnoses:  Principal Problem:   Sepsis affecting skin ([place]) Active Problems:   Streptococcal bacteremia   Morbid obesity ([place])   DM type 2 (diabetes mellitus, type 2) ([place])   Hypertension   Hydradenitis   ESRD (end stage renal disease) on dialysis ([place])    Subjective: No complaints of pain, dyspnea, cough, nausea, vomiting, diarrhea or constipation. Anxious to go home.   Brief Summary: 55 yo AAF with ESRD on HD, DM, chronic hypotension morbid obesity  and recurrent infected hidradenitis who presents with purulent discharge from wounds, sepsis (fever 102.2, hypotension with SBP in 80s, WBC 18,00) , lactic acidosis admitted 9/29. She was placed on empiric vancomycin and declined a surgical evaluation.      Hospital Course:  Sepsis  - due to Hidradenitis and Streptococcus Salivarius bacteremia - as this organism is found in the oral cavity, Panorex was recommended by ID but she has declined it - no dental pain  --has multiple fistulating tracts in perneal and gluteal area with purulent discharge and blood- has declined surgical consult - ID recommendations are to cont Vanc x 4 wks on dialysis and Flagyl x 2 wks  - she was also being given Rocephin based on sensitivities- ID has recommended it to be discontinued when discharged    - repeat cultures negative - awaiting TTE does not show any vegetations- TEE not recommended by ID as she would not be a candidate for CVTS - Wound care eval complete-the  following are wound care recommended : Cleanse hydradenitis lesions with NS and pat gently dry. Apply calcium alginate to open wounds for absorption, after laying down a nonadherent contact layer to wound bed for atraumatic dressing removal.Cover with 4x4 gauze and ABD pads/tape. Change daily and PRN soilage.  Mepitel silicone contact layer (LAWSON # [id number]) Calcium alginate (LAWSON # [id number])  Active Problems:   Morbid obesity  - Body mass index is 58.71 kg/m - consider bariatric surgery referral    DM type 2 (diabetes mellitus, type 2) - sugars stable - A1c 5.3- cont Glipizide   ESRD (end stage renal disease) on dialysis  - MWF dialysis -appreciate nephrology f/u  Hypotension - typically takes Midodrine QHS at home- we have increased frequency to TID    Anemia of CKD - stable   Discharge Instructions  Discharge Instructions    Increase activity slowly    Complete by:  As directed        Medication List    STOP taking these medications   cephALEXin 500 MG capsule Commonly known as:  KEFLEX   isosorbide mononitrate 30 MG 24 hr tablet Commonly known as:  IMDUR     TAKE these medications   aspirin EC 81 MG tablet Take 81 mg by mouth daily.   BYSTOLIC 2.5 MG tablet Generic drug:  nebivolol Take 2.5 mg by mouth daily.   Calcium Acetate 667 MG Tabs Take 2,001 mg by mouth 3 (three) times daily with meals.   clindamycin 1 % external solution Commonly known  as:  CLEOCIN T Apply 1 application topically 2 (two) times daily. TO THE AFFECTED AREAS   DULoxetine 60 MG capsule Commonly known as:  CYMBALTA Take 1 capsule (60 mg total) by mouth daily.   famotidine 20 MG tablet Commonly known as:  PEPCID TAKE 1 TABLET BY MOUTH AT BEDTIME What changed:  See the new instructions.   feeding supplement Liqd Take 1 Container by mouth 3 (three) times daily with meals.   gabapentin 100 MG capsule Commonly known as:  NEURONTIN Take 100 mg by mouth at bedtime.    glipiZIDE 5 MG tablet Commonly known as:  GLUCOTROL Take 0.5 tablets (2.5 mg total) by mouth daily.   levothyroxine 75 MCG tablet Commonly known as:  SYNTHROID, LEVOTHROID Take 75 mcg by mouth daily.   metroNIDAZOLE 500 MG tablet Commonly known as:  FLAGYL Take 1 tablet (500 mg total) by mouth 3 (three) times daily.   midodrine 10 MG tablet Commonly known as:  PROAMATINE Take 1 tablet (10 mg total) by mouth 3 (three) times daily. What changed:  when to take this   multivitamin Tabs tablet Take 1 tablet by mouth daily.   oxyCODONE-acetaminophen 5-325 MG tablet Commonly known as:  PERCOCET/ROXICET Take 1-2 tablets by mouth every 6 (six) hours as needed. Patient has not taken since started dialysis 08/2013 due to causes hypotension What changed:  reasons to take this  additional instructions   pravastatin 40 MG tablet Commonly known as:  PRAVACHOL Take 40 mg by mouth daily. Patient takes in am   PROBIOTIC DAILY PO Take 1 tablet by mouth daily.   SENSIPAR 60 MG tablet Generic drug:  cinacalcet Take 60 mg by mouth daily.   VELPHORO 500 MG chewable tablet Generic drug:  sucroferric oxyhydroxide Chew 500 mg by mouth 3 (three) times daily with meals.       Allergies  Allergen Reactions  . Rosiglitazone Maleate Swelling and Other (See Comments)    SWELLING REACTION UNSPECIFIED   . Amoxicillin Rash and Other (See Comments)    Tolerated Zosyn 01/2013. [place]  . Amoxicillin-Pot Clavulanate Diarrhea     Procedures/Studies: 2 D ECHO: Left ventricle: The cavity size was normal. Wall thickness was   increased in a pattern of mild LVH. Systolic function was normal.   The estimated ejection fraction was in the range of 60% to 65%.   Wall motion was normal; there were no regional wall motion   abnormalities. Left ventricular diastolic function parameters   were normal.  -------------------------------------------------------------------   Dg Chest 1  View  Result Date: 08/25/2016 CLINICAL DATA:  Shortness of breath for few days EXAM: CHEST 1 VIEW COMPARISON:  04/30/2015 FINDINGS: Cardiomegaly with progression from prior exam. Central mild vascular congestion and mild interstitial prominence bilateral suspicious for mild interstitial edema. Elevation of the right hemidiaphragm. Bilateral basilar atelectasis. No segmental infiltrate. IMPRESSION: Central mild vascular congestion and mild interstitial prominence bilateral suspicious for mild interstitial edema. Elevation of the right hemidiaphragm. Bilateral basilar atelectasis. No segmental infiltrate. Electronically Signed   By: Lahoma Crocker M.D.   On: 08/25/2016 13:39       Discharge Exam: Vitals:   08/31/16 0900 08/31/16 1223  BP: (!) 109/53 102/71  Pulse: 68 66  Resp: 16 17  Temp: 98.4 F (36.9 C) 98.3 F (36.8 C)   Vitals:   08/30/16 2110 08/31/16 0554 08/31/16 0900 08/31/16 1223  BP: 99/64 (!) 96/46 (!) 109/53 102/71  Pulse: 64 67 68 66  Resp: 19 18 16  17  Temp: 98.1 F (36.7 C) 98 F (36.7 C) 98.4 F (36.9 C) 98.3 F (36.8 C)  TempSrc: Oral Oral Oral Oral  SpO2: 100% 99% 100% 95%  Weight: (!) 166.5 kg (367 lb 1.1 oz)     Height:        General: Pt is alert, awake, not in acute distress Cardiovascular: RRR, S1/S2 +, no rubs, no gallops Respiratory: CTA bilaterally, no wheezing, no rhonchi Abdominal: Soft, NT, ND, bowel sounds + Extremities: no edema, no cyanosis    The results of significant diagnostics from this hospitalization (including imaging, microbiology, ancillary and laboratory) are listed below for reference.     Microbiology: Recent Results (from the past 240 hour(s))  Aerobic/Anaerobic Culture (surgical/deep wound)     Status: None   Collection Time: 08/25/16  1:53 PM  Result Value Ref Range Status   Specimen Description BUTTOCKS  Final   Special Requests Immunocompromised  Final   Gram Stain   Final    FEW WBC PRESENT, PREDOMINANTLY PMN ABUNDANT  GRAM POSITIVE COCCI IN PAIRS IN CLUSTERS ABUNDANT GRAM NEGATIVE RODS MODERATE GRAM VARIABLE ROD    Culture   Final    NORMAL SKIN FLORA MIXED ANAEROBIC FLORA PRESENT.  CALL LAB IF FURTHER IID REQUIRED.    Report Status 08/30/2016 FINAL  Final  Blood culture (routine x 2)     Status: Abnormal   Collection Time: 08/25/16  2:35 PM  Result Value Ref Range Status   Specimen Description BLOOD RIGHT ARM  Final   Special Requests BOTTLES DRAWN AEROBIC AND ANAEROBIC 5CC  Final   Culture  Setup Time   Final    GRAM POSITIVE COCCI IN CHAINS ANAEROBIC BOTTLE ONLY CRITICAL RESULT CALLED TO, READ BACK BY AND VERIFIED WITH: T STONE,PHARMD AT 1003 08/26/16 BY L BENFIELD    Culture STREPTOCOCCUS SALIVARIUS (A)  Final   Report Status 08/29/2016 FINAL  Final   Organism ID, Bacteria STREPTOCOCCUS SALIVARIUS  Final      Susceptibility   Streptococcus salivarius - MIC*    PENICILLIN Value in next row Intermediate      INTERMEDIATE0.25 ;0.25    CEFTRIAXONE Value in next row Sensitive      SENSITIVE0.25    ERYTHROMYCIN Value in next row Resistant      SENSITIVE0.25    LEVOFLOXACIN Value in next row Sensitive      SENSITIVE0.25    VANCOMYCIN Value in next row Sensitive      SENSITIVE0.25    * STREPTOCOCCUS SALIVARIUS  Blood Culture ID Panel (Reflexed)     Status: Abnormal   Collection Time: 08/25/16  2:35 PM  Result Value Ref Range Status   Enterococcus species NOT DETECTED NOT DETECTED Final   Listeria monocytogenes NOT DETECTED NOT DETECTED Final   Staphylococcus species NOT DETECTED NOT DETECTED Final   Staphylococcus aureus NOT DETECTED NOT DETECTED Final   Streptococcus species DETECTED (A) NOT DETECTED Final    Comment: CRITICAL RESULT CALLED TO, READ BACK BY AND VERIFIED WITH: T STONE,PHARMD AT 1003 08/26/16 BY L BENFIELD    Streptococcus agalactiae NOT DETECTED NOT DETECTED Final   Streptococcus pneumoniae NOT DETECTED NOT DETECTED Final   Streptococcus pyogenes NOT DETECTED NOT DETECTED  Final   Acinetobacter baumannii NOT DETECTED NOT DETECTED Final   Enterobacteriaceae species NOT DETECTED NOT DETECTED Final   Enterobacter cloacae complex NOT DETECTED NOT DETECTED Final   Escherichia coli NOT DETECTED NOT DETECTED Final   Klebsiella oxytoca NOT DETECTED NOT DETECTED Final  Klebsiella pneumoniae NOT DETECTED NOT DETECTED Final   Proteus species NOT DETECTED NOT DETECTED Final   Serratia marcescens NOT DETECTED NOT DETECTED Final   Haemophilus influenzae NOT DETECTED NOT DETECTED Final   Neisseria meningitidis NOT DETECTED NOT DETECTED Final   Pseudomonas aeruginosa NOT DETECTED NOT DETECTED Final   Candida albicans NOT DETECTED NOT DETECTED Final   Candida glabrata NOT DETECTED NOT DETECTED Final   Candida krusei NOT DETECTED NOT DETECTED Final   Candida parapsilosis NOT DETECTED NOT DETECTED Final   Candida tropicalis NOT DETECTED NOT DETECTED Final  Blood culture (routine x 2)     Status: Abnormal   Collection Time: 08/25/16  2:40 PM  Result Value Ref Range Status   Specimen Description BLOOD RIGHT HAND  Final   Special Requests BOTTLES DRAWN AEROBIC AND ANAEROBIC 5CC  Final   Culture  Setup Time   Final    GRAM POSITIVE COCCI IN CHAINS ANAEROBIC BOTTLE ONLY CRITICAL VALUE NOTED.  VALUE IS CONSISTENT WITH PREVIOUSLY REPORTED AND CALLED VALUE.    Culture (A)  Final    STREPTOCOCCUS SALIVARIUS  SUSCEPTIBILITIES PERFORMED ON PREVIOUS CULTURE WITHIN THE LAST 5 DAYS.    Report Status 08/28/2016 FINAL  Final  MRSA PCR Screening     Status: None   Collection Time: 08/26/16  1:56 AM  Result Value Ref Range Status   MRSA by PCR NEGATIVE NEGATIVE Final    Comment:        The GeneXpert MRSA Assay (FDA approved for NASAL specimens only), is one component of a comprehensive MRSA colonization surveillance program. It is not intended to diagnose MRSA infection nor to guide or monitor treatment for MRSA infections.   Culture, blood (routine x 2)     Status: None  (Preliminary result)   Collection Time: 08/28/16  2:25 PM  Result Value Ref Range Status   Specimen Description BLOOD RIGHT HAND  Final   Special Requests IN PEDIATRIC BOTTLE 0.5ML  Final   Culture NO GROWTH 3 DAYS  Final   Report Status PENDING  Incomplete  Culture, blood (routine x 2)     Status: None (Preliminary result)   Collection Time: 08/28/16  2:29 PM  Result Value Ref Range Status   Specimen Description BLOOD RIGHT HAND  Final   Special Requests IN PEDIATRIC BOTTLE 0.5 ML  Final   Culture NO GROWTH 3 DAYS  Final   Report Status PENDING  Incomplete     Labs: BNP (last 3 results) No results for input(s): BNP in the last 8760 hours. Basic Metabolic Panel:  Recent Labs Lab 08/25/16 2024  08/27/16 0540 08/28/16 0724 08/29/16 0402 08/30/16 0749 08/31/16 0906  NA 135  < > 137 135 134* 134* 135  K 3.4*  < > 3.9 3.7 3.9 4.4 4.3  CL 99*  < > 99* 99* 100* 100* 97*  CO2 23  < > 24 22 24  21* 21*  GLUCOSE 141*  < > 120* 123* 98 81 83  BUN 60*  < > 53* 68* 55* 70* 57*  CREATININE 9.55*  < > 9.54* 11.15* 9.63* 11.59* 10.19*  CALCIUM 8.3*  < > 8.5* 8.1* 8.1* 7.7* 7.6*  PHOS 1.7*  --   --   --   --   --   --   < > = values in this interval not displayed. Liver Function Tests:  Recent Labs Lab 08/25/16 2024  ALBUMIN 2.5*   No results for input(s): LIPASE, AMYLASE in the last 168  hours. No results for input(s): AMMONIA in the last 168 hours. CBC:  Recent Labs Lab 08/25/16 1131  08/27/16 0540 08/28/16 0724 08/29/16 0402 08/30/16 0749 08/31/16 0906  WBC 17.9*  < > 18.1* 18.8* 17.0* 17.7* 17.5*  NEUTROABS 16.3*  --   --   --   --   --   --   HGB 9.8*  < > 8.8* 8.1* 8.3* 8.8* 8.2*  HCT 33.8*  < > 31.2* 27.9* 29.3* 30.5* 29.0*  MCV 101.2*  < > 101.6* 99.3 101.0* 100.3* 100.7*  PLT 447*  < > 366 351 374 351 336  < > = values in this interval not displayed. Cardiac Enzymes: No results for input(s): CKTOTAL, CKMB, CKMBINDEX, TROPONINI in the last 168  hours. BNP: Invalid input(s): POCBNP CBG:  Recent Labs Lab 08/30/16 0726 08/30/16 1604 08/30/16 2108 08/31/16 0732 08/31/16 1204  GLUCAP 75 151* 130* 87 103*   D-Dimer No results for input(s): DDIMER in the last 72 hours. Hgb A1c No results for input(s): HGBA1C in the last 72 hours. Lipid Profile No results for input(s): CHOL, HDL, LDLCALC, TRIG, CHOLHDL, LDLDIRECT in the last 72 hours. Thyroid function studies No results for input(s): TSH, T4TOTAL, T3FREE, THYROIDAB in the last 72 hours.  Invalid input(s): FREET3 Anemia work up No results for input(s): VITAMINB12, FOLATE, FERRITIN, TIBC, IRON, RETICCTPCT in the last 72 hours. Urinalysis    Component Value Date/Time   COLORURINE YELLOW 07/18/2013 0831   APPEARANCEUR CLOUDY (A) 07/18/2013 0831   LABSPEC 1.012 07/18/2013 0831   PHURINE 5.0 07/18/2013 0831   GLUCOSEU NEGATIVE 07/18/2013 0831   HGBUR SMALL (A) 07/18/2013 0831   BILIRUBINUR NEGATIVE 07/18/2013 0831   KETONESUR NEGATIVE 07/18/2013 0831   PROTEINUR 30 (A) 07/18/2013 0831   UROBILINOGEN 0.2 07/18/2013 0831   NITRITE NEGATIVE 07/18/2013 0831   LEUKOCYTESUR MODERATE (A) 07/18/2013 0831   Sepsis Labs Invalid input(s): PROCALCITONIN,  WBC,  LACTICIDVEN Microbiology Recent Results (from the past 240 hour(s))  Aerobic/Anaerobic Culture (surgical/deep wound)     Status: None   Collection Time: 08/25/16  1:53 PM  Result Value Ref Range Status   Specimen Description BUTTOCKS  Final   Special Requests Immunocompromised  Final   Gram Stain   Final    FEW WBC PRESENT, PREDOMINANTLY PMN ABUNDANT GRAM POSITIVE COCCI IN PAIRS IN CLUSTERS ABUNDANT GRAM NEGATIVE RODS MODERATE GRAM VARIABLE ROD    Culture   Final    NORMAL SKIN FLORA MIXED ANAEROBIC FLORA PRESENT.  CALL LAB IF FURTHER IID REQUIRED.    Report Status 08/30/2016 FINAL  Final  Blood culture (routine x 2)     Status: Abnormal   Collection Time: 08/25/16  2:35 PM  Result Value Ref Range Status    Specimen Description BLOOD RIGHT ARM  Final   Special Requests BOTTLES DRAWN AEROBIC AND ANAEROBIC 5CC  Final   Culture  Setup Time   Final    GRAM POSITIVE COCCI IN CHAINS ANAEROBIC BOTTLE ONLY CRITICAL RESULT CALLED TO, READ BACK BY AND VERIFIED WITH: T STONE,PHARMD AT 1003 08/26/16 BY L BENFIELD    Culture STREPTOCOCCUS SALIVARIUS (A)  Final   Report Status 08/29/2016 FINAL  Final   Organism ID, Bacteria STREPTOCOCCUS SALIVARIUS  Final      Susceptibility   Streptococcus salivarius - MIC*    PENICILLIN Value in next row Intermediate      INTERMEDIATE0.25 ;0.25    CEFTRIAXONE Value in next row Sensitive      SENSITIVE0.25  ERYTHROMYCIN Value in next row Resistant      SENSITIVE0.25    LEVOFLOXACIN Value in next row Sensitive      SENSITIVE0.25    VANCOMYCIN Value in next row Sensitive      SENSITIVE0.25    * STREPTOCOCCUS SALIVARIUS  Blood Culture ID Panel (Reflexed)     Status: Abnormal   Collection Time: 08/25/16  2:35 PM  Result Value Ref Range Status   Enterococcus species NOT DETECTED NOT DETECTED Final   Listeria monocytogenes NOT DETECTED NOT DETECTED Final   Staphylococcus species NOT DETECTED NOT DETECTED Final   Staphylococcus aureus NOT DETECTED NOT DETECTED Final   Streptococcus species DETECTED (A) NOT DETECTED Final    Comment: CRITICAL RESULT CALLED TO, READ BACK BY AND VERIFIED WITH: T STONE,PHARMD AT 1003 08/26/16 BY L BENFIELD    Streptococcus agalactiae NOT DETECTED NOT DETECTED Final   Streptococcus pneumoniae NOT DETECTED NOT DETECTED Final   Streptococcus pyogenes NOT DETECTED NOT DETECTED Final   Acinetobacter baumannii NOT DETECTED NOT DETECTED Final   Enterobacteriaceae species NOT DETECTED NOT DETECTED Final   Enterobacter cloacae complex NOT DETECTED NOT DETECTED Final   Escherichia coli NOT DETECTED NOT DETECTED Final   Klebsiella oxytoca NOT DETECTED NOT DETECTED Final   Klebsiella pneumoniae NOT DETECTED NOT DETECTED Final   Proteus species  NOT DETECTED NOT DETECTED Final   Serratia marcescens NOT DETECTED NOT DETECTED Final   Haemophilus influenzae NOT DETECTED NOT DETECTED Final   Neisseria meningitidis NOT DETECTED NOT DETECTED Final   Pseudomonas aeruginosa NOT DETECTED NOT DETECTED Final   Candida albicans NOT DETECTED NOT DETECTED Final   Candida glabrata NOT DETECTED NOT DETECTED Final   Candida krusei NOT DETECTED NOT DETECTED Final   Candida parapsilosis NOT DETECTED NOT DETECTED Final   Candida tropicalis NOT DETECTED NOT DETECTED Final  Blood culture (routine x 2)     Status: Abnormal   Collection Time: 08/25/16  2:40 PM  Result Value Ref Range Status   Specimen Description BLOOD RIGHT HAND  Final   Special Requests BOTTLES DRAWN AEROBIC AND ANAEROBIC 5CC  Final   Culture  Setup Time   Final    GRAM POSITIVE COCCI IN CHAINS ANAEROBIC BOTTLE ONLY CRITICAL VALUE NOTED.  VALUE IS CONSISTENT WITH PREVIOUSLY REPORTED AND CALLED VALUE.    Culture (A)  Final    STREPTOCOCCUS SALIVARIUS  SUSCEPTIBILITIES PERFORMED ON PREVIOUS CULTURE WITHIN THE LAST 5 DAYS.    Report Status 08/28/2016 FINAL  Final  MRSA PCR Screening     Status: None   Collection Time: 08/26/16  1:56 AM  Result Value Ref Range Status   MRSA by PCR NEGATIVE NEGATIVE Final    Comment:        The GeneXpert MRSA Assay (FDA approved for NASAL specimens only), is one component of a comprehensive MRSA colonization surveillance program. It is not intended to diagnose MRSA infection nor to guide or monitor treatment for MRSA infections.   Culture, blood (routine x 2)     Status: None (Preliminary result)   Collection Time: 08/28/16  2:25 PM  Result Value Ref Range Status   Specimen Description BLOOD RIGHT HAND  Final   Special Requests IN PEDIATRIC BOTTLE 0.5ML  Final   Culture NO GROWTH 3 DAYS  Final   Report Status PENDING  Incomplete  Culture, blood (routine x 2)     Status: None (Preliminary result)   Collection Time: 08/28/16  2:29 PM   Result Value Ref Range Status  Specimen Description BLOOD RIGHT HAND  Final   Special Requests IN PEDIATRIC BOTTLE 0.5 ML  Final   Culture NO GROWTH 3 DAYS  Final   Report Status PENDING  Incomplete     Time coordinating discharge: Over 30 minutes  SIGNED:   Debbe Odea, MD  [facility] 08/31/2016, 2:36 PM Pager   If 7PM-7AM, please contact night-coverage [email or web address] Password [email or web address]

## 2016-08-31 NOTE — Progress Notes (Signed)
[facility] Progress Note   Subjective:  Seen in room. Did not get TTE yesterday; refused apparently. Unclear if this will be done today. Denies CP or worsened edema. Pain improved from HS lesions. No fever/chills.  ECHO done, results back, normal TTE.   Objective Vitals:   08/30/16 1640 08/30/16 2110 08/31/16 0554 08/31/16 0900  BP: (!) 117/50 99/64 (!) 96/46 (!) 109/53  Pulse: 67 64 67 68  Resp: 20 19 18 16   Temp:  98.1 F (36.7 C) 98 F (36.7 C) 98.4 F (36.9 C)  TempSrc:  Oral Oral Oral  SpO2: 100% 100% 99% 100%  Weight:  (!) 166.5 kg (367 lb 1.1 oz)    Height:       Physical Exam General: Friendly, overweight female. NAD. Heart: Exam limited by habitus; distance heart sounds. RRR; no murmur appreciated. Lungs: CTAB Extremities: No LE edema. Dialysis Access: LUE AVF + thrill/bruit  Dialysis Orders: [facility]  4 hrs 30 min, EDW 162.5 kg, 2K / 2.5 Ca bath, BFR 500/DFR800 - Heparin 5000 units initial bolus, 5000 unit mid-run bolus - Venofer 50mg  IV q weekly - Mircera 225 mcg IV q 2 weeks (last given 08/23/16) - Calcitriol 1.50 mcg PO q HD  Additional Objective Labs: Basic Metabolic Panel:  Recent Labs Lab 08/25/16 2024  08/28/16 0724 08/29/16 0402 08/30/16 0749  NA 135  < > 135 134* 134*  K 3.4*  < > 3.7 3.9 4.4  CL 99*  < > 99* 100* 100*  CO2 23  < > 22 24 21*  GLUCOSE 141*  < > 123* 98 81  BUN 60*  < > 68* 55* 70*  CREATININE 9.55*  < > 11.15* 9.63* 11.59*  CALCIUM 8.3*  < > 8.1* 8.1* 7.7*  PHOS 1.7*  --   --   --   --   < > = values in this interval not displayed. Liver Function Tests:  Recent Labs Lab 08/25/16 2024  ALBUMIN 2.5*   CBC:  Recent Labs Lab 08/25/16 1131  08/26/16 0845 08/27/16 0540 08/28/16 0724 08/29/16 0402 08/30/16 0749  WBC 17.9*  < > 19.3* 18.1* 18.8* 17.0* 17.7*  NEUTROABS 16.3*  --   --   --   --   --   --   HGB 9.8*  < > 8.1* 8.8* 8.1* 8.3* 8.8*  HCT 33.8*  < > 27.9* 31.2* 27.9* 29.3* 30.5*  MCV  101.2*  < > 100.4* 101.6* 99.3 101.0* 100.3*  PLT 447*  < > 356 366 351 374 351  < > = values in this interval not displayed. Blood Culture    Component Value Date/Time   SDES BLOOD RIGHT HAND 08/28/2016 1429   SPECREQUEST IN PEDIATRIC BOTTLE 0.5 ML 08/28/2016 1429   CULT NO GROWTH 2 DAYS 08/28/2016 1429   REPTSTATUS PENDING 08/28/2016 1429   CBG:  Recent Labs Lab 08/29/16 2053 08/30/16 0726 08/30/16 1604 08/30/16 2108 08/31/16 0732  GLUCAP 154* 75 151* 130* 87   Studies/Results: No results found. Medications:   . acidophilus   Oral Daily  . aspirin EC  81 mg Oral Daily  . calcitRIOL  1.5 mcg Oral Q M,W,F-HD  . cefTRIAXone (ROCEPHIN)  IV  2 g Intravenous Q24H  . cinacalcet  60 mg Oral Daily  . DULoxetine  60 mg Oral Daily  . famotidine  20 mg Oral QHS  . feeding supplement  1 Container Oral TID WC  . gabapentin  100 mg Oral QHS  .  glipiZIDE  2.5 mg Oral Q breakfast  . heparin  5,000 Units Subcutaneous Q8H  . Influenza vac split quadrivalent PF  0.5 mL Intramuscular Tomorrow-1000  . insulin aspart  0-9 Units Subcutaneous TID WC  . levothyroxine  75 mcg Oral QAC breakfast  . metroNIDAZOLE  500 mg Oral Q8H  . midodrine  10 mg Oral TID WC  . multivitamin  1 tablet Oral Daily  . nebivolol  2.5 mg Oral Daily  . pravastatin  40 mg Oral Daily  . senna  1 tablet Oral BID  . sodium chloride flush  3 mL Intravenous Q12H  . sodium chloride flush  3 mL Intravenous Q12H  . vancomycin  1,000 mg Intravenous Q M,W,F-HD     Assessment/Plan: 1.  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 Had been on Ceftriaxone; now Vancomycin x 4 weeks per ID. Also on IV       Flagyl/Ceftriaxone while here. TTE pending, unclear if needed at this time since treating empirically to cover for IE. 2.  ESRD: Continue MWF schedule. K 4.4, 3K bath.  3.  Hypotension/Volume: BP chronically low; on  midodrine TID. Bed weights off, post-HD weight 155kg after HD 10/2,      165kg after HD 10/4. Continue OP dry weight for now. K 4.4, 3K bath. 4.  Anemia: HGB 8.8; stable. On Mircera 225 q 2 weeks (last given 9/27). Follow Hgb. IV iron held in setting of infection. 5.  Metabolic Bone Disease: Last Phos 1.7; binders held (velphoro 2/meals, Phoslo 3/meals). Corr Ca ok. Continue      Sensipar/VDRA for now. Will recheck Phos. 6. Nutrition: Albumin 2.5. Continue Renal/Carb mod diet/renal vit/prostat.  7.  DM: per primary.  8.  Dispo: [place] for dc from renal standpoint.   [name], PA-C 08/31/2016, 9:16 AM  [facility] Pager: [phone]  Pt seen, examined and agree w A/P as above.  Kelly Splinter MD [employer] pager [phone]   08/31/2016, 12:14 PM

## 2016-08-31 NOTE — Progress Notes (Signed)
  Echocardiogram 2D Echocardiogram has been performed.  Darlina Sicilian M 08/31/2016, 11:12 AM

## 2016-08-31 NOTE — Care Management Note (Signed)
Case Management Note  Patient Details  Name: [name] MRN: [id number] Date of Birth: [birth year]  Subjective/Objective:   Streptococcal Bacteremia, Sepsis affecting Skin                 Action/Plan: Discharge Planning: AVS reviewed: NCM spoke to pt and mother, [name] at bedside. Mother states she does her dressing changes everyday in am and husband in the pm. Offered choice for HH/provided list. Pt requested [facility]. Contacted [facility] with new referral and dressing changes needed. Unit RN will provide supplies for pt to have evening dressing changes. [facility] RN start of care on tomorrow. Pt has oxygen, RW and wheelchair. Has portable oxygen for dc. Oxygen supplied by [facility]. NCM contacted admission to make aware pt has Medicare. [name] will fax her copy of insurance card to admission. Provided fax number.   PCP-WHITE, CYNTHIA MD  Expected Discharge Date:                  Expected Discharge Plan:  [place]  In-House Referral:  NA  Discharge planning Services  CM Consult  Post Acute Care Choice:  Home Health Choice offered to:  Parent  DME Arranged:  N/A DME Agency:  NA  HH Arranged:  RN [place] Agency:  [place]  Status of Service:  Completed, signed off  If discussed at [facility] of Stay Meetings, dates discussed:    Additional Comments:  Erenest Rasher, RN 08/31/2016, 4:49 PM

## 2016-08-31 NOTE — Discharge Instructions (Signed)
Wound care:Cleanse hydradenitis lesions with NS and pat gently dry. Apply calcium alginate to open wounds for absorption, after laying down a nonadherent contact layer to wound bed for atraumatic dressing removal.Cover with 4x4 gauze and ABD pads/tape. Change daily and PRN soilage.  Mepitel silicone contact layer (LAWSON # [id number]) Calcium alginate (LAWSON # [id number])   Please take all your medications with you for your next visit with your Primary MD. Please request your Primary MD to go over all hospital test results at the follow up. Please ask your Primary MD to get all Hospital records sent to his/her office.  If you experience worsening of your admission symptoms, develop shortness of breath, chest pain, suicidal or homicidal thoughts or a life threatening emergency, you must seek medical attention immediately by calling 911 or calling your MD.  Dennis Bast must read the complete instructions/literature along with all the possible adverse reactions/side effects for all the medicines you take including new medications that have been prescribed to you. Take new medicines after you have completely understood and accpet all the possible adverse reactions/side effects.   Do not drive when taking pain medications or sedatives.    Do not take more than prescribed Pain, Sleep and Anxiety Medications  If you have smoked or chewed Tobacco in the last 2 yrs please stop. Stop any regular alcohol and or recreational drug use.  Wear Seat belts while driving.

## 2016-09-01 DIAGNOSIS — E1129 Type 2 diabetes mellitus with other diabetic kidney complication: Secondary | ICD-10-CM | POA: Diagnosis not present

## 2016-09-01 DIAGNOSIS — N2581 Secondary hyperparathyroidism of renal origin: Secondary | ICD-10-CM | POA: Diagnosis not present

## 2016-09-01 DIAGNOSIS — D631 Anemia in chronic kidney disease: Secondary | ICD-10-CM | POA: Diagnosis not present

## 2016-09-01 DIAGNOSIS — N186 End stage renal disease: Secondary | ICD-10-CM | POA: Diagnosis not present

## 2016-09-01 DIAGNOSIS — R7881 Bacteremia: Secondary | ICD-10-CM | POA: Diagnosis not present

## 2016-09-01 DIAGNOSIS — D509 Iron deficiency anemia, unspecified: Secondary | ICD-10-CM | POA: Diagnosis not present

## 2016-09-02 DIAGNOSIS — Z87440 Personal history of urinary (tract) infections: Secondary | ICD-10-CM | POA: Diagnosis not present

## 2016-09-02 DIAGNOSIS — Z792 Long term (current) use of antibiotics: Secondary | ICD-10-CM | POA: Diagnosis not present

## 2016-09-02 DIAGNOSIS — E1121 Type 2 diabetes mellitus with diabetic nephropathy: Secondary | ICD-10-CM | POA: Diagnosis not present

## 2016-09-02 DIAGNOSIS — L989 Disorder of the skin and subcutaneous tissue, unspecified: Secondary | ICD-10-CM | POA: Diagnosis not present

## 2016-09-02 DIAGNOSIS — G4730 Sleep apnea, unspecified: Secondary | ICD-10-CM | POA: Diagnosis not present

## 2016-09-02 DIAGNOSIS — Z992 Dependence on renal dialysis: Secondary | ICD-10-CM | POA: Diagnosis not present

## 2016-09-02 DIAGNOSIS — I130 Hypertensive heart and chronic kidney disease with heart failure and stage 1 through stage 4 chronic kidney disease, or unspecified chronic kidney disease: Secondary | ICD-10-CM | POA: Diagnosis not present

## 2016-09-02 DIAGNOSIS — N186 End stage renal disease: Secondary | ICD-10-CM | POA: Diagnosis not present

## 2016-09-02 DIAGNOSIS — E785 Hyperlipidemia, unspecified: Secondary | ICD-10-CM | POA: Diagnosis not present

## 2016-09-02 DIAGNOSIS — E11319 Type 2 diabetes mellitus with unspecified diabetic retinopathy without macular edema: Secondary | ICD-10-CM | POA: Diagnosis not present

## 2016-09-02 DIAGNOSIS — I5032 Chronic diastolic (congestive) heart failure: Secondary | ICD-10-CM | POA: Diagnosis not present

## 2016-09-02 DIAGNOSIS — L732 Hidradenitis suppurativa: Secondary | ICD-10-CM | POA: Diagnosis not present

## 2016-09-02 DIAGNOSIS — F329 Major depressive disorder, single episode, unspecified: Secondary | ICD-10-CM | POA: Diagnosis not present

## 2016-09-02 DIAGNOSIS — D509 Iron deficiency anemia, unspecified: Secondary | ICD-10-CM | POA: Diagnosis not present

## 2016-09-02 DIAGNOSIS — Z7984 Long term (current) use of oral hypoglycemic drugs: Secondary | ICD-10-CM | POA: Diagnosis not present

## 2016-09-02 DIAGNOSIS — E039 Hypothyroidism, unspecified: Secondary | ICD-10-CM | POA: Diagnosis not present

## 2016-09-02 DIAGNOSIS — Z79891 Long term (current) use of opiate analgesic: Secondary | ICD-10-CM | POA: Diagnosis not present

## 2016-09-02 DIAGNOSIS — D631 Anemia in chronic kidney disease: Secondary | ICD-10-CM | POA: Diagnosis not present

## 2016-09-02 DIAGNOSIS — E1122 Type 2 diabetes mellitus with diabetic chronic kidney disease: Secondary | ICD-10-CM | POA: Diagnosis not present

## 2016-09-02 LAB — CULTURE, BLOOD (ROUTINE X 2)
CULTURE: NO GROWTH
Culture: NO GROWTH

## 2016-09-04 DIAGNOSIS — E1121 Type 2 diabetes mellitus with diabetic nephropathy: Secondary | ICD-10-CM | POA: Diagnosis not present

## 2016-09-04 DIAGNOSIS — I130 Hypertensive heart and chronic kidney disease with heart failure and stage 1 through stage 4 chronic kidney disease, or unspecified chronic kidney disease: Secondary | ICD-10-CM | POA: Diagnosis not present

## 2016-09-04 DIAGNOSIS — E1122 Type 2 diabetes mellitus with diabetic chronic kidney disease: Secondary | ICD-10-CM | POA: Diagnosis not present

## 2016-09-04 DIAGNOSIS — E1129 Type 2 diabetes mellitus with other diabetic kidney complication: Secondary | ICD-10-CM | POA: Diagnosis not present

## 2016-09-04 DIAGNOSIS — N186 End stage renal disease: Secondary | ICD-10-CM | POA: Diagnosis not present

## 2016-09-04 DIAGNOSIS — N2581 Secondary hyperparathyroidism of renal origin: Secondary | ICD-10-CM | POA: Diagnosis not present

## 2016-09-04 DIAGNOSIS — L732 Hidradenitis suppurativa: Secondary | ICD-10-CM | POA: Diagnosis not present

## 2016-09-04 DIAGNOSIS — R7881 Bacteremia: Secondary | ICD-10-CM | POA: Diagnosis not present

## 2016-09-04 DIAGNOSIS — D509 Iron deficiency anemia, unspecified: Secondary | ICD-10-CM | POA: Diagnosis not present

## 2016-09-04 DIAGNOSIS — I5032 Chronic diastolic (congestive) heart failure: Secondary | ICD-10-CM | POA: Diagnosis not present

## 2016-09-04 DIAGNOSIS — D631 Anemia in chronic kidney disease: Secondary | ICD-10-CM | POA: Diagnosis not present

## 2016-09-05 DIAGNOSIS — L732 Hidradenitis suppurativa: Secondary | ICD-10-CM | POA: Diagnosis not present

## 2016-09-05 DIAGNOSIS — N186 End stage renal disease: Secondary | ICD-10-CM | POA: Diagnosis not present

## 2016-09-05 DIAGNOSIS — I130 Hypertensive heart and chronic kidney disease with heart failure and stage 1 through stage 4 chronic kidney disease, or unspecified chronic kidney disease: Secondary | ICD-10-CM | POA: Diagnosis not present

## 2016-09-05 DIAGNOSIS — I5032 Chronic diastolic (congestive) heart failure: Secondary | ICD-10-CM | POA: Diagnosis not present

## 2016-09-05 DIAGNOSIS — E1121 Type 2 diabetes mellitus with diabetic nephropathy: Secondary | ICD-10-CM | POA: Diagnosis not present

## 2016-09-05 DIAGNOSIS — E1122 Type 2 diabetes mellitus with diabetic chronic kidney disease: Secondary | ICD-10-CM | POA: Diagnosis not present

## 2016-09-06 DIAGNOSIS — E1129 Type 2 diabetes mellitus with other diabetic kidney complication: Secondary | ICD-10-CM | POA: Diagnosis not present

## 2016-09-06 DIAGNOSIS — L732 Hidradenitis suppurativa: Secondary | ICD-10-CM | POA: Diagnosis not present

## 2016-09-06 DIAGNOSIS — N186 End stage renal disease: Secondary | ICD-10-CM | POA: Diagnosis not present

## 2016-09-06 DIAGNOSIS — A491 Streptococcal infection, unspecified site: Secondary | ICD-10-CM | POA: Diagnosis not present

## 2016-09-06 DIAGNOSIS — R7881 Bacteremia: Secondary | ICD-10-CM | POA: Diagnosis not present

## 2016-09-06 DIAGNOSIS — N2581 Secondary hyperparathyroidism of renal origin: Secondary | ICD-10-CM | POA: Diagnosis not present

## 2016-09-06 DIAGNOSIS — D509 Iron deficiency anemia, unspecified: Secondary | ICD-10-CM | POA: Diagnosis not present

## 2016-09-06 DIAGNOSIS — D631 Anemia in chronic kidney disease: Secondary | ICD-10-CM | POA: Diagnosis not present

## 2016-09-07 DIAGNOSIS — E1122 Type 2 diabetes mellitus with diabetic chronic kidney disease: Secondary | ICD-10-CM | POA: Diagnosis not present

## 2016-09-07 DIAGNOSIS — N186 End stage renal disease: Secondary | ICD-10-CM | POA: Diagnosis not present

## 2016-09-07 DIAGNOSIS — E1121 Type 2 diabetes mellitus with diabetic nephropathy: Secondary | ICD-10-CM | POA: Diagnosis not present

## 2016-09-07 DIAGNOSIS — I130 Hypertensive heart and chronic kidney disease with heart failure and stage 1 through stage 4 chronic kidney disease, or unspecified chronic kidney disease: Secondary | ICD-10-CM | POA: Diagnosis not present

## 2016-09-07 DIAGNOSIS — L732 Hidradenitis suppurativa: Secondary | ICD-10-CM | POA: Diagnosis not present

## 2016-09-07 DIAGNOSIS — I5032 Chronic diastolic (congestive) heart failure: Secondary | ICD-10-CM | POA: Diagnosis not present

## 2016-09-08 DIAGNOSIS — N2581 Secondary hyperparathyroidism of renal origin: Secondary | ICD-10-CM | POA: Diagnosis not present

## 2016-09-08 DIAGNOSIS — D509 Iron deficiency anemia, unspecified: Secondary | ICD-10-CM | POA: Diagnosis not present

## 2016-09-08 DIAGNOSIS — N186 End stage renal disease: Secondary | ICD-10-CM | POA: Diagnosis not present

## 2016-09-08 DIAGNOSIS — E1129 Type 2 diabetes mellitus with other diabetic kidney complication: Secondary | ICD-10-CM | POA: Diagnosis not present

## 2016-09-08 DIAGNOSIS — R7881 Bacteremia: Secondary | ICD-10-CM | POA: Diagnosis not present

## 2016-09-08 DIAGNOSIS — D631 Anemia in chronic kidney disease: Secondary | ICD-10-CM | POA: Diagnosis not present

## 2016-09-11 DIAGNOSIS — N2581 Secondary hyperparathyroidism of renal origin: Secondary | ICD-10-CM | POA: Diagnosis not present

## 2016-09-11 DIAGNOSIS — D509 Iron deficiency anemia, unspecified: Secondary | ICD-10-CM | POA: Diagnosis not present

## 2016-09-11 DIAGNOSIS — D631 Anemia in chronic kidney disease: Secondary | ICD-10-CM | POA: Diagnosis not present

## 2016-09-11 DIAGNOSIS — R7881 Bacteremia: Secondary | ICD-10-CM | POA: Diagnosis not present

## 2016-09-11 DIAGNOSIS — N186 End stage renal disease: Secondary | ICD-10-CM | POA: Diagnosis not present

## 2016-09-11 DIAGNOSIS — E1129 Type 2 diabetes mellitus with other diabetic kidney complication: Secondary | ICD-10-CM | POA: Diagnosis not present

## 2016-09-12 DIAGNOSIS — I130 Hypertensive heart and chronic kidney disease with heart failure and stage 1 through stage 4 chronic kidney disease, or unspecified chronic kidney disease: Secondary | ICD-10-CM | POA: Diagnosis not present

## 2016-09-12 DIAGNOSIS — E1122 Type 2 diabetes mellitus with diabetic chronic kidney disease: Secondary | ICD-10-CM | POA: Diagnosis not present

## 2016-09-12 DIAGNOSIS — I5032 Chronic diastolic (congestive) heart failure: Secondary | ICD-10-CM | POA: Diagnosis not present

## 2016-09-12 DIAGNOSIS — N186 End stage renal disease: Secondary | ICD-10-CM | POA: Diagnosis not present

## 2016-09-12 DIAGNOSIS — L732 Hidradenitis suppurativa: Secondary | ICD-10-CM | POA: Diagnosis not present

## 2016-09-12 DIAGNOSIS — E1121 Type 2 diabetes mellitus with diabetic nephropathy: Secondary | ICD-10-CM | POA: Diagnosis not present

## 2016-09-13 DIAGNOSIS — N2581 Secondary hyperparathyroidism of renal origin: Secondary | ICD-10-CM | POA: Diagnosis not present

## 2016-09-13 DIAGNOSIS — A491 Streptococcal infection, unspecified site: Secondary | ICD-10-CM | POA: Diagnosis not present

## 2016-09-13 DIAGNOSIS — D509 Iron deficiency anemia, unspecified: Secondary | ICD-10-CM | POA: Diagnosis not present

## 2016-09-13 DIAGNOSIS — R7881 Bacteremia: Secondary | ICD-10-CM | POA: Diagnosis not present

## 2016-09-13 DIAGNOSIS — N186 End stage renal disease: Secondary | ICD-10-CM | POA: Diagnosis not present

## 2016-09-13 DIAGNOSIS — L732 Hidradenitis suppurativa: Secondary | ICD-10-CM | POA: Diagnosis not present

## 2016-09-13 DIAGNOSIS — E1129 Type 2 diabetes mellitus with other diabetic kidney complication: Secondary | ICD-10-CM | POA: Diagnosis not present

## 2016-09-13 DIAGNOSIS — D631 Anemia in chronic kidney disease: Secondary | ICD-10-CM | POA: Diagnosis not present

## 2016-09-14 DIAGNOSIS — I130 Hypertensive heart and chronic kidney disease with heart failure and stage 1 through stage 4 chronic kidney disease, or unspecified chronic kidney disease: Secondary | ICD-10-CM | POA: Diagnosis not present

## 2016-09-14 DIAGNOSIS — I5022 Chronic systolic (congestive) heart failure: Secondary | ICD-10-CM | POA: Diagnosis not present

## 2016-09-14 DIAGNOSIS — F324 Major depressive disorder, single episode, in partial remission: Secondary | ICD-10-CM | POA: Diagnosis not present

## 2016-09-14 DIAGNOSIS — I5032 Chronic diastolic (congestive) heart failure: Secondary | ICD-10-CM | POA: Diagnosis not present

## 2016-09-14 DIAGNOSIS — I429 Cardiomyopathy, unspecified: Secondary | ICD-10-CM | POA: Diagnosis not present

## 2016-09-14 DIAGNOSIS — E1121 Type 2 diabetes mellitus with diabetic nephropathy: Secondary | ICD-10-CM | POA: Diagnosis not present

## 2016-09-14 DIAGNOSIS — Z992 Dependence on renal dialysis: Secondary | ICD-10-CM | POA: Diagnosis not present

## 2016-09-14 DIAGNOSIS — E440 Moderate protein-calorie malnutrition: Secondary | ICD-10-CM | POA: Diagnosis not present

## 2016-09-14 DIAGNOSIS — E1140 Type 2 diabetes mellitus with diabetic neuropathy, unspecified: Secondary | ICD-10-CM | POA: Diagnosis not present

## 2016-09-14 DIAGNOSIS — L732 Hidradenitis suppurativa: Secondary | ICD-10-CM | POA: Diagnosis not present

## 2016-09-14 DIAGNOSIS — J9611 Chronic respiratory failure with hypoxia: Secondary | ICD-10-CM | POA: Diagnosis not present

## 2016-09-14 DIAGNOSIS — N186 End stage renal disease: Secondary | ICD-10-CM | POA: Diagnosis not present

## 2016-09-14 DIAGNOSIS — E1122 Type 2 diabetes mellitus with diabetic chronic kidney disease: Secondary | ICD-10-CM | POA: Diagnosis not present

## 2016-09-14 DIAGNOSIS — E039 Hypothyroidism, unspecified: Secondary | ICD-10-CM | POA: Diagnosis not present

## 2016-09-15 DIAGNOSIS — N186 End stage renal disease: Secondary | ICD-10-CM | POA: Diagnosis not present

## 2016-09-15 DIAGNOSIS — D509 Iron deficiency anemia, unspecified: Secondary | ICD-10-CM | POA: Diagnosis not present

## 2016-09-15 DIAGNOSIS — R7881 Bacteremia: Secondary | ICD-10-CM | POA: Diagnosis not present

## 2016-09-15 DIAGNOSIS — D631 Anemia in chronic kidney disease: Secondary | ICD-10-CM | POA: Diagnosis not present

## 2016-09-15 DIAGNOSIS — E1129 Type 2 diabetes mellitus with other diabetic kidney complication: Secondary | ICD-10-CM | POA: Diagnosis not present

## 2016-09-15 DIAGNOSIS — N2581 Secondary hyperparathyroidism of renal origin: Secondary | ICD-10-CM | POA: Diagnosis not present

## 2016-09-18 ENCOUNTER — Encounter: Payer: Self-pay | Admitting: Cardiology

## 2016-09-18 DIAGNOSIS — N2581 Secondary hyperparathyroidism of renal origin: Secondary | ICD-10-CM | POA: Diagnosis not present

## 2016-09-18 DIAGNOSIS — R7881 Bacteremia: Secondary | ICD-10-CM | POA: Diagnosis not present

## 2016-09-18 DIAGNOSIS — N186 End stage renal disease: Secondary | ICD-10-CM | POA: Diagnosis not present

## 2016-09-18 DIAGNOSIS — E1129 Type 2 diabetes mellitus with other diabetic kidney complication: Secondary | ICD-10-CM | POA: Diagnosis not present

## 2016-09-18 DIAGNOSIS — D509 Iron deficiency anemia, unspecified: Secondary | ICD-10-CM | POA: Diagnosis not present

## 2016-09-18 DIAGNOSIS — D631 Anemia in chronic kidney disease: Secondary | ICD-10-CM | POA: Diagnosis not present

## 2016-09-18 NOTE — Progress Notes (Signed)
Cardiology Office Note    Date:  09/19/2016   ID:  [name], DOB 04/24/1961, MRN [id number]  PCP:  Shelley Schwalbe, MD  Cardiologist:  Shelley Him, MD   Chief Complaint  Patient presents with  . Congestive Heart Failure  . Hypertension    History of Present Illness:  [name] is a 55 y.o. female with a history of DCM now with normalized EF 50-55% by echo 8/14, obesity, chronic diastolic CHF, HTN who presents today for followup. She is doing well from a cardiac standpoint and denies any chest pain, dizziness, palpitations or syncope.  She has had chronic LE edema in the past but that has resolved. She has chronic SOB and has COPD and is on O2 at night. She thinks her breathing has gotten worse.  She has started using her O2 when moving around the house as well.  She is unable to walk long distances due to DOE.      Past Medical History:  Diagnosis Date  . Chronic diastolic CHF (congestive heart failure) ([place])   . Chronic kidney disease (CKD), stage III (moderate)    dr [name] nephrology lov note 05-12-2013 on pt chart now on HD  . DCM (dilated cardiomyopathy) ([place])    nonischemic - EF now 50-55% by echo 2014  . Depression   . DM type 2 (diabetes mellitus, type 2) ([facility])    diet controlledwith retinopathy and nephropathy  . GERD (gastroesophageal reflux disease)   . H/O cardiac arrest 10/2012  . Hard of hearing 10/2012   now wears 2 hearing aids  . History of hemodialysis dec 2013  . HTN (hypertension)   . Hydradenitis   . Hyperkalemia   . Hyperlipidemia   . Hypothyroidism   . Iron deficiency anemia   . Morbid obesity ([place])   . Multinodular goiter   . Pneumonia Nov 10, 2012  . Sleep apnea    no test yet per mother 10/22/13   . Urinary tract infection    taking antibiotics for 3 days prior to surgery    Past Surgical History:  Procedure Laterality Date  . AV FISTULA PLACEMENT Left 07/25/2013   Procedure: ARTERIOVENOUS (AV) FISTULA CREATION ;  Surgeon:  Shelley Posner, MD;  Location: [place];  Service: Vascular;  Laterality: Left;  . CYSTOSCOPY W/ RETROGRADES  11/16/2012   Procedure: CYSTOSCOPY WITH RETROGRADE PYELOGRAM;  Surgeon: Shelley Packer, MD;  Location: [facility];  Service: Urology;  Laterality: Bilateral;  CYSTOSCOPY,BILATERAL RETROGRADE PYELOGRAM/ REMOVAL LEFT URETERAL STENT/ FULGERATION BLADDER MUCOSA/ INSERTION RIGHT URETERAL STENT  . CYSTOSCOPY W/ URETERAL STENT PLACEMENT  05/28/2012   Procedure: CYSTOSCOPY WITH RETROGRADE PYELOGRAM/URETERAL STENT PLACEMENT;  Surgeon: Shelley Packer, MD;  Location: [facility];  Service: Urology;  Laterality: Left;  . CYSTOSCOPY WITH RETROGRADE PYELOGRAM, URETEROSCOPY AND STENT PLACEMENT Right 06/23/2013   Procedure: CYSTOSCOPY WITH RIGHT URETEROSCOPY, RIGHT  RETROGRADE PYELOGRAM, WITH LASER LIPOTRIPSY AND RIGHT URETERAL STENT EXCHANGE ;  Surgeon: Shelley Hazard, MD;  Location: [facility];  Service: Urology;  Laterality: Right;  STENT EXCHANGE    . CYSTOSCOPY WITH RETROGRADE PYELOGRAM, URETEROSCOPY AND STENT PLACEMENT Right 11/06/2013   Procedure: CYSTOSCOPY WITH RETROGRADE PYELOGRAM, URETEROSCOPY STONE REMOVAL AND STENT REMOVAL;  Surgeon: Shelley Hazard, MD;  Location: [facility];  Service: Urology;  Laterality: Right;  . HOLMIUM LASER APPLICATION N/A 5/63/8756   Procedure: HOLMIUM LASER APPLICATION;  Surgeon: Shelley Hazard, MD;  Location: [facility];  Service: Urology;  Laterality: N/A;  . INSERTION  OF DIALYSIS CATHETER N/A 09/24/2013   Procedure: INSERTION OF DIALYSIS CATHETER;  Surgeon: Shelley Posner, MD;  Location: [place];  Service: Vascular;  Laterality: N/A;  . PORT-A-CATH REMOVAL    . PORTACATH PLACEMENT    . REVISON OF ARTERIOVENOUS FISTULA Left 07/25/2016   Procedure: REVISON OF LEFT ARTERIOVENOUS FISTULA;  Surgeon: Shelley Dutch, MD;  Location[facility];  Service: Vascular;  Laterality: Left;    Current Medications: Outpatient Medications Prior to Visit  Medication Sig Dispense Refill    . aspirin EC 81 MG tablet Take 81 mg by mouth daily.    . Calcium Acetate 667 MG TABS Take 2,001 mg by mouth 3 (three) times daily with meals.     . clindamycin (CLEOCIN T) 1 % external solution Apply 1 application topically 2 (two) times daily. TO THE AFFECTED AREAS    . DULoxetine (CYMBALTA) 60 MG capsule Take 1 capsule (60 mg total) by mouth daily. 30 capsule 0  . famotidine (PEPCID) 20 MG tablet TAKE 1 TABLET BY MOUTH AT BEDTIME (Patient taking differently: Take 20 mg by mouth at bedtime) 30 tablet 0  . feeding supplement, RESOURCE BREEZE, (RESOURCE BREEZE) LIQD Take 1 Container by mouth 3 (three) times daily with meals.    Marland Kitchen glipiZIDE (GLUCOTROL) 5 MG tablet Take 0.5 tablets (2.5 mg total) by mouth daily. 30 tablet 0  . levothyroxine (SYNTHROID, LEVOTHROID) 75 MCG tablet Take 75 mcg by mouth daily.  0  . midodrine (PROAMATINE) 10 MG tablet Take 1 tablet (10 mg total) by mouth 3 (three) times daily. 90 tablet 0  . multivitamin (RENA-VIT) TABS tablet Take 1 tablet by mouth daily.  6  . oxyCODONE-acetaminophen (PERCOCET/ROXICET) 5-325 MG tablet Take 1-2 tablets by mouth every 6 (six) hours as needed. Patient has not taken since started dialysis 08/2013 due to causes hypotension (Patient taking differently: Take 1-2 tablets by mouth every 6 (six) hours as needed for moderate pain. ) 6 tablet 0  . pravastatin (PRAVACHOL) 40 MG tablet Take 40 mg by mouth daily. Patient takes in am    . Probiotic Product (PROBIOTIC DAILY PO) Take 1 tablet by mouth daily.     . SENSIPAR 60 MG tablet Take 60 mg by mouth daily.     . sucroferric oxyhydroxide (VELPHORO) 500 MG chewable tablet Chew 500 mg by mouth 3 (three) times daily with meals.    Marland Kitchen BYSTOLIC 2.5 MG tablet Take 2.5 mg by mouth daily.     Marland Kitchen gabapentin (NEURONTIN) 100 MG capsule Take 100 mg by mouth at bedtime.     . metroNIDAZOLE (FLAGYL) 500 MG tablet Take 1 tablet (500 mg total) by mouth 3 (three) times daily. (Patient not taking: Reported on  09/19/2016) 28 tablet 0   No facility-administered medications prior to visit.      Allergies:   Rosiglitazone maleate; Amoxicillin; and Amoxicillin-pot clavulanate   Social History   Social History  . Marital status: Married    Spouse name: N/A  . Number of children: N/A  . Years of education: N/A   Social History Main Topics  . Smoking status: Former Smoker    Packs/day: 0.25    Years: 1.00    Types: Cigarettes    Quit date: 11/28/1979  . Smokeless tobacco: Never Used  . Alcohol use No  . Drug use: No  . Sexual activity: No   Other Topics Concern  . None   Social History Narrative  . None     Family History:  The patient's family history includes Cancer in her maternal grandfather; Coronary artery disease in her father and mother; Diabetes type II in her mother; Hypertension in her father and mother; Kidney failure in her maternal grandmother; Malignant hyperthermia in her father.   ROS:   Please see the history of present illness.    ROS All other systems reviewed and are negative.  No flowsheet data found.     PHYSICAL EXAM:   VS:  BP 130/78   Pulse 88   Ht 5\' 6"  (1.676 m)   Wt (!) 350 lb (158.8 kg)   LMP 06/13/2013   SpO2 98%   BMI 56.49 kg/m    GEN: Well nourished, well developed, in no acute distress  HEENT: normal  Neck: no JVD, carotid bruits, or masses Cardiac: RRR; no murmurs, rubs, or gallops,no edema.  Intact distal pulses bilaterally.  Respiratory:  clear to auscultation bilaterally, normal work of breathing GI: soft, nontender, nondistended, + BS MS: no deformity or atrophy  Skin: warm and dry, no rash Neuro:  Alert and Oriented x 3, Strength and sensation are intact Psych: euthymic mood, full affect  Wt Readings from Last 3 Encounters:  09/19/16 (!) 350 lb (158.8 kg)  08/30/16 (!) 367 lb 1.1 oz (166.5 kg)  07/25/16 (!) 418 lb 14 oz (190 kg)      Studies/Labs Reviewed:   EKG:  EKG is ordered today.  The ekg ordered today  demonstrates   Recent Labs: 08/31/2016: BUN 57; Creatinine, Ser 10.19; Hemoglobin 8.2; Platelets 336; Potassium 4.3; Sodium 135   Lipid Panel    Component Value Date/Time   CHOL 80 01/20/2012 1639   TRIG 55 01/20/2012 1639   HDL 28 (L) 01/20/2012 1639   CHOLHDL 2.9 01/20/2012 1639   VLDL 11 01/20/2012 1639   LDLCALC 41 01/20/2012 1639    Additional studies/ records that were reviewed today include:  none    ASSESSMENT:    1. Chronic diastolic CHF (congestive heart failure) ([place])   2. DCM (dilated cardiomyopathy) ([place])   3. Essential hypertension   4. Shortness of breath      PLAN:  In order of problems listed above:  1. Chronic diastolic CHF - she appears euvolemic on exam but is still SOB.  Weight appears inconsistent.  Continue BB.  Volume control per HD. I will check a BNP today due to onging SOB which I suspect is due to morbid obesity. 2. DCM - EF now normalized 3. HTN - BP controlled on current meds.  COntinue BB. 4. SOB - suspect due to morbid obesity and deconditioning.  I will refer to Pulmonary for evaluation and get a Lexiscan myoview to rule out ischemia.     Medication Adjustments/Labs and Tests Ordered: Current medicines are reviewed at length with the patient today.  Concerns regarding medicines are outlined above.  Medication changes, Labs and Tests ordered today are listed in the Patient Instructions below.  Patient Instructions  Medication Instructions:  Your physician recommends that you continue on your current medications as directed. Please refer to the Current Medication list given to you today.   Labwork: TODAY: BNP  Testing/Procedures: Your physician has requested that you have a lexiscan myoview. For further information please visit [email or web address]. Please follow instruction sheet, as given.  Follow-Up: You have been referred to Dr. Chase Caller.  Your physician wants you to follow-up in: 6 months with Dr. Theodosia Blender assistant. You will  receive a reminder letter in the mail two months in advance. If  you don't receive a letter, please call our office to schedule the follow-up appointment.   Your physician wants you to follow-up in: 1 year with Dr. Radford Pax. You will receive a reminder letter in the mail two months in advance. If you don't receive a letter, please call our office to schedule the follow-up appointment.   Any Other Special Instructions Will Be Listed Below (If Applicable).     If you need a refill on your cardiac medications before your next appointment, please call your pharmacy.      Signed, Shelley Him, MD  09/19/2016 1:39 PM    [facility] [place], [place], [place]  [address] Phone: [phone]; Fax: [phone]

## 2016-09-19 ENCOUNTER — Encounter: Payer: Self-pay | Admitting: Cardiology

## 2016-09-19 ENCOUNTER — Ambulatory Visit (INDEPENDENT_AMBULATORY_CARE_PROVIDER_SITE_OTHER): Payer: BC Managed Care – PPO | Admitting: Cardiology

## 2016-09-19 VITALS — BP 130/78 | HR 88 | Ht 66.0 in | Wt 350.0 lb

## 2016-09-19 DIAGNOSIS — R0602 Shortness of breath: Secondary | ICD-10-CM | POA: Diagnosis not present

## 2016-09-19 DIAGNOSIS — I420 Dilated cardiomyopathy: Secondary | ICD-10-CM | POA: Diagnosis not present

## 2016-09-19 DIAGNOSIS — I10 Essential (primary) hypertension: Secondary | ICD-10-CM

## 2016-09-19 DIAGNOSIS — I5032 Chronic diastolic (congestive) heart failure: Secondary | ICD-10-CM

## 2016-09-19 LAB — BRAIN NATRIURETIC PEPTIDE: Brain Natriuretic Peptide: 171.2 pg/mL — ABNORMAL HIGH (ref <100)

## 2016-09-19 NOTE — Patient Instructions (Signed)
Medication Instructions:  Your physician recommends that you continue on your current medications as directed. Please refer to the Current Medication list given to you today.   Labwork: TODAY: BNP  Testing/Procedures: Your physician has requested that you have a lexiscan myoview. For further information please visit [email or web address]. Please follow instruction sheet, as given.  Follow-Up: You have been referred to Dr. Chase Caller.  Your physician wants you to follow-up in: 6 months with Dr. Theodosia Blender assistant. You will receive a reminder letter in the mail two months in advance. If you don't receive a letter, please call our office to schedule the follow-up appointment.   Your physician wants you to follow-up in: 1 year with Dr. Radford Pax. You will receive a reminder letter in the mail two months in advance. If you don't receive a letter, please call our office to schedule the follow-up appointment.   Any Other Special Instructions Will Be Listed Below (If Applicable).     If you need a refill on your cardiac medications before your next appointment, please call your pharmacy.

## 2016-09-20 DIAGNOSIS — D631 Anemia in chronic kidney disease: Secondary | ICD-10-CM | POA: Diagnosis not present

## 2016-09-20 DIAGNOSIS — D509 Iron deficiency anemia, unspecified: Secondary | ICD-10-CM | POA: Diagnosis not present

## 2016-09-20 DIAGNOSIS — N2581 Secondary hyperparathyroidism of renal origin: Secondary | ICD-10-CM | POA: Diagnosis not present

## 2016-09-20 DIAGNOSIS — E1129 Type 2 diabetes mellitus with other diabetic kidney complication: Secondary | ICD-10-CM | POA: Diagnosis not present

## 2016-09-20 DIAGNOSIS — L732 Hidradenitis suppurativa: Secondary | ICD-10-CM | POA: Diagnosis not present

## 2016-09-20 DIAGNOSIS — N186 End stage renal disease: Secondary | ICD-10-CM | POA: Diagnosis not present

## 2016-09-20 DIAGNOSIS — R7881 Bacteremia: Secondary | ICD-10-CM | POA: Diagnosis not present

## 2016-09-20 DIAGNOSIS — A491 Streptococcal infection, unspecified site: Secondary | ICD-10-CM | POA: Diagnosis not present

## 2016-09-21 ENCOUNTER — Telehealth: Payer: Self-pay | Admitting: Cardiology

## 2016-09-21 DIAGNOSIS — E1121 Type 2 diabetes mellitus with diabetic nephropathy: Secondary | ICD-10-CM | POA: Diagnosis not present

## 2016-09-21 DIAGNOSIS — I130 Hypertensive heart and chronic kidney disease with heart failure and stage 1 through stage 4 chronic kidney disease, or unspecified chronic kidney disease: Secondary | ICD-10-CM | POA: Diagnosis not present

## 2016-09-21 DIAGNOSIS — I5032 Chronic diastolic (congestive) heart failure: Secondary | ICD-10-CM | POA: Diagnosis not present

## 2016-09-21 DIAGNOSIS — L732 Hidradenitis suppurativa: Secondary | ICD-10-CM | POA: Diagnosis not present

## 2016-09-21 DIAGNOSIS — N186 End stage renal disease: Secondary | ICD-10-CM | POA: Diagnosis not present

## 2016-09-21 DIAGNOSIS — E1122 Type 2 diabetes mellitus with diabetic chronic kidney disease: Secondary | ICD-10-CM | POA: Diagnosis not present

## 2016-09-21 NOTE — Telephone Encounter (Signed)
Called, spoke with pt's mother, [name]. Reviewed recent BNP results. BNP elevated 171.2. Explained I would route this information to Nephrologist to adjust dry weight.   Routed to Dr. Florene Glen and Dr. Jimmy Footman at [facility].   [name] verbalized understanding and thanked me for calling.

## 2016-09-21 NOTE — Telephone Encounter (Signed)
[name] is returning call concerning lab work

## 2016-09-22 DIAGNOSIS — D631 Anemia in chronic kidney disease: Secondary | ICD-10-CM | POA: Diagnosis not present

## 2016-09-22 DIAGNOSIS — N2581 Secondary hyperparathyroidism of renal origin: Secondary | ICD-10-CM | POA: Diagnosis not present

## 2016-09-22 DIAGNOSIS — E1129 Type 2 diabetes mellitus with other diabetic kidney complication: Secondary | ICD-10-CM | POA: Diagnosis not present

## 2016-09-22 DIAGNOSIS — D509 Iron deficiency anemia, unspecified: Secondary | ICD-10-CM | POA: Diagnosis not present

## 2016-09-22 DIAGNOSIS — N186 End stage renal disease: Secondary | ICD-10-CM | POA: Diagnosis not present

## 2016-09-22 DIAGNOSIS — R7881 Bacteremia: Secondary | ICD-10-CM | POA: Diagnosis not present

## 2016-09-25 DIAGNOSIS — N2581 Secondary hyperparathyroidism of renal origin: Secondary | ICD-10-CM | POA: Diagnosis not present

## 2016-09-25 DIAGNOSIS — E1129 Type 2 diabetes mellitus with other diabetic kidney complication: Secondary | ICD-10-CM | POA: Diagnosis not present

## 2016-09-25 DIAGNOSIS — D509 Iron deficiency anemia, unspecified: Secondary | ICD-10-CM | POA: Diagnosis not present

## 2016-09-25 DIAGNOSIS — D631 Anemia in chronic kidney disease: Secondary | ICD-10-CM | POA: Diagnosis not present

## 2016-09-25 DIAGNOSIS — R7881 Bacteremia: Secondary | ICD-10-CM | POA: Diagnosis not present

## 2016-09-25 DIAGNOSIS — N186 End stage renal disease: Secondary | ICD-10-CM | POA: Diagnosis not present

## 2016-09-26 DIAGNOSIS — E1121 Type 2 diabetes mellitus with diabetic nephropathy: Secondary | ICD-10-CM | POA: Diagnosis not present

## 2016-09-26 DIAGNOSIS — N186 End stage renal disease: Secondary | ICD-10-CM | POA: Diagnosis not present

## 2016-09-26 DIAGNOSIS — Z992 Dependence on renal dialysis: Secondary | ICD-10-CM | POA: Diagnosis not present

## 2016-09-26 DIAGNOSIS — L732 Hidradenitis suppurativa: Secondary | ICD-10-CM | POA: Diagnosis not present

## 2016-09-26 DIAGNOSIS — I5032 Chronic diastolic (congestive) heart failure: Secondary | ICD-10-CM | POA: Diagnosis not present

## 2016-09-26 DIAGNOSIS — E1129 Type 2 diabetes mellitus with other diabetic kidney complication: Secondary | ICD-10-CM | POA: Diagnosis not present

## 2016-09-26 DIAGNOSIS — I130 Hypertensive heart and chronic kidney disease with heart failure and stage 1 through stage 4 chronic kidney disease, or unspecified chronic kidney disease: Secondary | ICD-10-CM | POA: Diagnosis not present

## 2016-09-26 DIAGNOSIS — E1122 Type 2 diabetes mellitus with diabetic chronic kidney disease: Secondary | ICD-10-CM | POA: Diagnosis not present

## 2016-09-27 DIAGNOSIS — L732 Hidradenitis suppurativa: Secondary | ICD-10-CM | POA: Diagnosis not present

## 2016-09-27 DIAGNOSIS — E1129 Type 2 diabetes mellitus with other diabetic kidney complication: Secondary | ICD-10-CM | POA: Diagnosis not present

## 2016-09-27 DIAGNOSIS — N186 End stage renal disease: Secondary | ICD-10-CM | POA: Diagnosis not present

## 2016-09-27 DIAGNOSIS — A491 Streptococcal infection, unspecified site: Secondary | ICD-10-CM | POA: Diagnosis not present

## 2016-09-27 DIAGNOSIS — N2581 Secondary hyperparathyroidism of renal origin: Secondary | ICD-10-CM | POA: Diagnosis not present

## 2016-09-27 DIAGNOSIS — D509 Iron deficiency anemia, unspecified: Secondary | ICD-10-CM | POA: Diagnosis not present

## 2016-09-27 DIAGNOSIS — D631 Anemia in chronic kidney disease: Secondary | ICD-10-CM | POA: Diagnosis not present

## 2016-09-28 DIAGNOSIS — E1122 Type 2 diabetes mellitus with diabetic chronic kidney disease: Secondary | ICD-10-CM | POA: Diagnosis not present

## 2016-09-28 DIAGNOSIS — I5032 Chronic diastolic (congestive) heart failure: Secondary | ICD-10-CM | POA: Diagnosis not present

## 2016-09-28 DIAGNOSIS — L732 Hidradenitis suppurativa: Secondary | ICD-10-CM | POA: Diagnosis not present

## 2016-09-28 DIAGNOSIS — E1121 Type 2 diabetes mellitus with diabetic nephropathy: Secondary | ICD-10-CM | POA: Diagnosis not present

## 2016-09-28 DIAGNOSIS — I130 Hypertensive heart and chronic kidney disease with heart failure and stage 1 through stage 4 chronic kidney disease, or unspecified chronic kidney disease: Secondary | ICD-10-CM | POA: Diagnosis not present

## 2016-09-28 DIAGNOSIS — N186 End stage renal disease: Secondary | ICD-10-CM | POA: Diagnosis not present

## 2016-09-29 DIAGNOSIS — N186 End stage renal disease: Secondary | ICD-10-CM | POA: Diagnosis not present

## 2016-09-29 DIAGNOSIS — D631 Anemia in chronic kidney disease: Secondary | ICD-10-CM | POA: Diagnosis not present

## 2016-09-29 DIAGNOSIS — E1129 Type 2 diabetes mellitus with other diabetic kidney complication: Secondary | ICD-10-CM | POA: Diagnosis not present

## 2016-09-29 DIAGNOSIS — N2581 Secondary hyperparathyroidism of renal origin: Secondary | ICD-10-CM | POA: Diagnosis not present

## 2016-09-29 DIAGNOSIS — D509 Iron deficiency anemia, unspecified: Secondary | ICD-10-CM | POA: Diagnosis not present

## 2016-09-29 DIAGNOSIS — L732 Hidradenitis suppurativa: Secondary | ICD-10-CM | POA: Diagnosis not present

## 2016-10-02 DIAGNOSIS — N2581 Secondary hyperparathyroidism of renal origin: Secondary | ICD-10-CM | POA: Diagnosis not present

## 2016-10-02 DIAGNOSIS — E1129 Type 2 diabetes mellitus with other diabetic kidney complication: Secondary | ICD-10-CM | POA: Diagnosis not present

## 2016-10-02 DIAGNOSIS — L732 Hidradenitis suppurativa: Secondary | ICD-10-CM | POA: Diagnosis not present

## 2016-10-02 DIAGNOSIS — D509 Iron deficiency anemia, unspecified: Secondary | ICD-10-CM | POA: Diagnosis not present

## 2016-10-02 DIAGNOSIS — N186 End stage renal disease: Secondary | ICD-10-CM | POA: Diagnosis not present

## 2016-10-02 DIAGNOSIS — D631 Anemia in chronic kidney disease: Secondary | ICD-10-CM | POA: Diagnosis not present

## 2016-10-03 ENCOUNTER — Telehealth: Payer: Self-pay | Admitting: Infectious Disease

## 2016-10-03 ENCOUNTER — Ambulatory Visit (INDEPENDENT_AMBULATORY_CARE_PROVIDER_SITE_OTHER)
Admission: RE | Admit: 2016-10-03 | Discharge: 2016-10-03 | Disposition: A | Discharge status: Home / Self Care | Payer: Medicare Other | Source: Ambulatory Visit | Attending: Pulmonary Disease | Admitting: Pulmonary Disease

## 2016-10-03 ENCOUNTER — Encounter: Payer: Self-pay | Admitting: Pulmonary Disease

## 2016-10-03 ENCOUNTER — Ambulatory Visit (INDEPENDENT_AMBULATORY_CARE_PROVIDER_SITE_OTHER): Payer: Medicare Other | Admitting: Pulmonary Disease

## 2016-10-03 VITALS — BP 124/76 | HR 100

## 2016-10-03 DIAGNOSIS — R0602 Shortness of breath: Secondary | ICD-10-CM | POA: Diagnosis not present

## 2016-10-03 DIAGNOSIS — R05 Cough: Secondary | ICD-10-CM

## 2016-10-03 DIAGNOSIS — L732 Hidradenitis suppurativa: Secondary | ICD-10-CM | POA: Diagnosis not present

## 2016-10-03 DIAGNOSIS — E1122 Type 2 diabetes mellitus with diabetic chronic kidney disease: Secondary | ICD-10-CM | POA: Diagnosis not present

## 2016-10-03 DIAGNOSIS — R059 Cough, unspecified: Secondary | ICD-10-CM

## 2016-10-03 DIAGNOSIS — E1121 Type 2 diabetes mellitus with diabetic nephropathy: Secondary | ICD-10-CM | POA: Diagnosis not present

## 2016-10-03 DIAGNOSIS — N186 End stage renal disease: Secondary | ICD-10-CM | POA: Diagnosis not present

## 2016-10-03 DIAGNOSIS — I130 Hypertensive heart and chronic kidney disease with heart failure and stage 1 through stage 4 chronic kidney disease, or unspecified chronic kidney disease: Secondary | ICD-10-CM | POA: Diagnosis not present

## 2016-10-03 DIAGNOSIS — I5032 Chronic diastolic (congestive) heart failure: Secondary | ICD-10-CM | POA: Diagnosis not present

## 2016-10-03 MED ORDER — AZELASTINE-FLUTICASONE 137-50 MCG/ACT NA SUSP: 2.0000 | Freq: Every day | NASAL | 3 refills | Status: DC

## 2016-10-03 NOTE — Progress Notes (Signed)
[name]    [id number]    1961-11-09  Primary Care Physician:WHITE,CYNTHIA S, MD  Referring Physician: Harlan Stains, MD [address] [place], [place] [address]  Chief complaint:  Evaluation of dyspnea, cough.   HPI: [name] is a 55-year-old with past mental history of end-stage renal disease on hemodialysis, DM, HTN, morbid obesity. She was last seen by Dr. Melvyn Novas in 2015 for dyspnea which is thought to be secondary to her obesity. She has recurrent admissions from sepsis secondary to Hidradenitis. She was last admitted in October 2017 for sepsis,Hidradenitis and Streptococcus Salivarius bacteremia. She follows with Dr. Radford Pax for chronic diastolic heart failure and dilated cardiomyopathy.  Her chief complaint is chronic cough for the past 2-3 weeks. She has clear mucus production. She has chronic dyspnea on exertion which is unchanged. She denies any chest pain, palpitation, wheezing, fevers, chills. She has chronic sinusitis and postnasal drip, no symptoms of acid reflux. She has very remote smoking history and no known exposures.  Outpatient Encounter Prescriptions as of 10/03/2016  Medication Sig  . aspirin EC 81 MG tablet Take 81 mg by mouth daily.  . Calcium Acetate 667 MG TABS Take 2,001 mg by mouth 3 (three) times daily with meals.   . clindamycin (CLEOCIN T) 1 % external solution Apply 1 application topically 2 (two) times daily. TO THE AFFECTED AREAS  . DULoxetine (CYMBALTA) 60 MG capsule Take 1 capsule (60 mg total) by mouth daily.  . famotidine (PEPCID) 20 MG tablet TAKE 1 TABLET BY MOUTH AT BEDTIME (Patient taking differently: Take 20 mg by mouth at bedtime)  . feeding supplement, RESOURCE BREEZE, (RESOURCE BREEZE) LIQD Take 1 Container by mouth 3 (three) times daily with meals.  Marland Kitchen glipiZIDE (GLUCOTROL) 5 MG tablet Take 0.5 tablets (2.5 mg total) by mouth daily.  Marland Kitchen levothyroxine (SYNTHROID, LEVOTHROID) 75 MCG tablet Take 75 mcg by mouth daily.  .  midodrine (PROAMATINE) 10 MG tablet Take 1 tablet (10 mg total) by mouth 3 (three) times daily. (Patient taking differently: Take 10 mg by mouth 3 (three) times a week. )  . multivitamin (RENA-VIT) TABS tablet Take 1 tablet by mouth daily.  Marland Kitchen oxyCODONE-acetaminophen (PERCOCET/ROXICET) 5-325 MG tablet Take 1-2 tablets by mouth every 6 (six) hours as needed. Patient has not taken since started dialysis 08/2013 due to causes hypotension (Patient taking differently: Take 1-2 tablets by mouth every 6 (six) hours as needed for moderate pain. )  . pravastatin (PRAVACHOL) 40 MG tablet Take 40 mg by mouth daily. Patient takes in am  . Probiotic Product (PROBIOTIC DAILY PO) Take 1 tablet by mouth daily.   . SENSIPAR 60 MG tablet Take 60 mg by mouth daily.   . sucroferric oxyhydroxide (VELPHORO) 500 MG chewable tablet Chew 500 mg by mouth 3 (three) times daily with meals.  Marland Kitchen VANCOMYCIN HCL IV Inject into the vein as directed.   No facility-administered encounter medications on file as of 10/03/2016.     Allergies as of 10/03/2016 - Review Complete 10/03/2016  Allergen Reaction Noted  . Rosiglitazone maleate Swelling and Other (See Comments)   . Amoxicillin Rash and Other (See Comments) 08/03/2011  . Amoxicillin-pot clavulanate Diarrhea 01/19/2012    Past Medical History:  Diagnosis Date  . Chronic diastolic CHF (congestive heart failure) ([place])   . Chronic kidney disease (CKD), stage III (moderate)    [name] nephrology lov note 05-12-2013 on pt chart now on HD  . DCM (  dilated cardiomyopathy) ([place])    nonischemic - EF now 50-55% by echo 2014  . Depression   . DM type 2 (diabetes mellitus, type 2) ([facility])    diet controlledwith retinopathy and nephropathy  . GERD (gastroesophageal reflux disease)   . H/O cardiac arrest 10/2012  . Hard of hearing 10/2012   now wears 2 hearing aids  . History of hemodialysis dec 2013  . HTN (hypertension)   . Hydradenitis   . Hyperkalemia   . Hyperlipidemia   .  Hypothyroidism   . Iron deficiency anemia   . Morbid obesity ([place])   . Multinodular goiter   . Pneumonia Nov 10, 2012  . Sleep apnea    no test yet per mother 10/22/13   . Urinary tract infection    taking antibiotics for 3 days prior to surgery    Past Surgical History:  Procedure Laterality Date  . AV FISTULA PLACEMENT Left 07/25/2013   Procedure: ARTERIOVENOUS (AV) FISTULA CREATION ;  Surgeon: Rosetta Posner, MD;  Location: [facility];  Service: Vascular;  Laterality: Left;  . CYSTOSCOPY W/ RETROGRADES  11/16/2012   Procedure: CYSTOSCOPY WITH RETROGRADE PYELOGRAM;  Surgeon: Reece Packer, MD;  Location: [facility];  Service: Urology;  Laterality: Bilateral;  CYSTOSCOPY,BILATERAL RETROGRADE PYELOGRAM/ REMOVAL LEFT URETERAL STENT/ FULGERATION BLADDER MUCOSA/ INSERTION RIGHT URETERAL STENT  . CYSTOSCOPY W/ URETERAL STENT PLACEMENT  05/28/2012   Procedure: CYSTOSCOPY WITH RETROGRADE PYELOGRAM/URETERAL STENT PLACEMENT;  Surgeon: Reece Packer, MD;  Location[facility];  Service: Urology;  Laterality: Left;  . CYSTOSCOPY WITH RETROGRADE PYELOGRAM, URETEROSCOPY AND STENT PLACEMENT Right 06/23/2013   Procedure: CYSTOSCOPY WITH RIGHT URETEROSCOPY, RIGHT  RETROGRADE PYELOGRAM, WITH LASER LIPOTRIPSY AND RIGHT URETERAL STENT EXCHANGE ;  Surgeon: Molli Hazard, MD;  Location[facility];  Service: Urology;  Laterality: Right;  STENT EXCHANGE    . CYSTOSCOPY WITH RETROGRADE PYELOGRAM, URETEROSCOPY AND STENT PLACEMENT Right 11/06/2013   Procedure: CYSTOSCOPY WITH RETROGRADE PYELOGRAM, URETEROSCOPY STONE REMOVAL AND STENT REMOVAL;  Surgeon: Molli Hazard, MD;  Location[facility];  Service: Urology;  Laterality: Right;  . HOLMIUM LASER APPLICATION N/A 1/61/0960   Procedure: HOLMIUM LASER APPLICATION;  Surgeon: Molli Hazard, MD;  Location[facility];  Service: Urology;  Laterality: N/A;  . INSERTION OF DIALYSIS CATHETER N/A 09/24/2013   Procedure: INSERTION OF DIALYSIS CATHETER;  Surgeon: Rosetta Posner, MD;  Location: [facility];  Service: Vascular;  Laterality: N/A;  . PORT-A-CATH REMOVAL    . PORTACATH PLACEMENT    . REVISON OF ARTERIOVENOUS FISTULA Left 07/25/2016   Procedure: REVISON OF LEFT ARTERIOVENOUS FISTULA;  Surgeon: Elam Dutch, MD;  [facility]: [facility];  Service: Vascular;  Laterality: Left;    Family History  Problem Relation Age of Onset  . Coronary artery disease Mother   . Hypertension Mother   . Diabetes type II Mother   . Coronary artery disease Father   . Hypertension Father   . Malignant hyperthermia Father   . Cancer Maternal Grandfather     ? Type  . Kidney failure Maternal Grandmother     Social History   Social History  . Marital status: Married    Spouse name: N/A  . Number of children: N/A  . Years of education: N/A   Occupational History  . Not on file.   Social History Main Topics  . Smoking status: Former Smoker    Packs/day: 0.25    Years: 1.00    Types: Cigarettes  Quit date: 11/28/1979  . Smokeless tobacco: Never Used  . Alcohol use No  . Drug use: No  . Sexual activity: No   Other Topics Concern  . Not on file   Social History Narrative  . No narrative on file     Review of systems: Review of Systems  Constitutional: Negative for fever and chills.  HENT: Negative.   Eyes: Negative for blurred vision.  Respiratory: as per HPI  Cardiovascular: Negative for chest pain and palpitations.  Gastrointestinal: Negative for vomiting, diarrhea, blood per rectum. Genitourinary: Negative for dysuria, urgency, frequency and hematuria.  Musculoskeletal: Negative for myalgias, back pain and joint pain.  Skin: Negative for itching and rash.  Neurological: Negative for dizziness, tremors, focal weakness, seizures and loss of consciousness.  Endo/Heme/Allergies: Negative for environmental allergies.  Psychiatric/Behavioral: Negative for depression, suicidal ideas and hallucinations.  All other systems reviewed and are  negative.   Physical Exam: Blood pressure 124/76, pulse 100, last menstrual period 06/13/2013, SpO2 99 %. Gen:      No acute distress, obese HEENT:  EOMI, sclera anicteric Neck:     No masses; no thyromegaly Lungs:    Clear to auscultation bilaterally; normal respiratory effort CV:         Regular rate and rhythm; no murmurs Abd:      + bowel sounds; soft, non-tender; no palpable masses, no distension Ext:    No edema; adequate peripheral perfusion Skin:      Warm and dry; no rash Neuro: alert and oriented x 3 Psych: normal mood and affect  Data Reviewed: CXR 08/25/16- Mild vascular congestion. Bibasilar atelectasis. Images reviewed.  Echo 08/31/16-   Left ventricle: The cavity size was normal. Wall thickness was   increased in a pattern of mild LVH. Systolic function was normal.   The estimated ejection fraction was in the range of 60% to 65%.   Wall motion was normal; there were no regional wall motion   abnormalities. Left ventricular diastolic function parameters   were normal.  Assessment:  #1 Dyspnea on exertion. Suspect that this is secondary to obesity and deconditioning. There is no evidence of lung disease on prior imaging and COPD is not likely in setting of minimal smoking history. I will evaluate by getting a repeat CXR and PFTs.  #2 Cough Likely upper airway cough syndrome from post nasal drip, sinus congestion. I will treat with chlorpheniramine 8 mg tid and dymista nasal spray.  Reassess in 1 month.  Plan/Recommendations: - CXR, PFTs - Chlorpheniramine 8 mg tid and dymista nasal spray.  Marshell Garfinkel MD [facility] Pager [phone] 10/03/2016, 1:48 PM  CC: Harlan Stains, MD

## 2016-10-03 NOTE — Telephone Encounter (Signed)
Received call from [facility] re [name] having dramatic increase in output from her HS lesions. Pt believes she is done w IV vanco mycin,  I recommended restart IV vanco + ceftazidime w HD + resume oral flagyl po tid  Can we touch base w hd center to start these IV abx + call in #90 metronidaloe 500mg  tid to her pharmacy. She is due to see Dr Megan Salon next Tuesday

## 2016-10-03 NOTE — Patient Instructions (Signed)
We schedule you for a chest x-ray and pulmonary function tests. We will start you on chlorpheniramine 8 mg tid and dymista nasal spray.  Return in 1 month

## 2016-10-04 DIAGNOSIS — E1129 Type 2 diabetes mellitus with other diabetic kidney complication: Secondary | ICD-10-CM | POA: Diagnosis not present

## 2016-10-04 DIAGNOSIS — D509 Iron deficiency anemia, unspecified: Secondary | ICD-10-CM | POA: Diagnosis not present

## 2016-10-04 DIAGNOSIS — L732 Hidradenitis suppurativa: Secondary | ICD-10-CM | POA: Diagnosis not present

## 2016-10-04 DIAGNOSIS — D631 Anemia in chronic kidney disease: Secondary | ICD-10-CM | POA: Diagnosis not present

## 2016-10-04 DIAGNOSIS — A491 Streptococcal infection, unspecified site: Secondary | ICD-10-CM | POA: Diagnosis not present

## 2016-10-04 DIAGNOSIS — N186 End stage renal disease: Secondary | ICD-10-CM | POA: Diagnosis not present

## 2016-10-04 DIAGNOSIS — N2581 Secondary hyperparathyroidism of renal origin: Secondary | ICD-10-CM | POA: Diagnosis not present

## 2016-10-04 NOTE — Telephone Encounter (Signed)
thx so much!

## 2016-10-04 NOTE — Telephone Encounter (Signed)
Shared message with Cassie, pharmacist, to process request.

## 2016-10-05 ENCOUNTER — Telehealth: Payer: Self-pay | Admitting: Pulmonary Disease

## 2016-10-05 ENCOUNTER — Telehealth: Payer: Self-pay | Admitting: Pharmacist

## 2016-10-05 DIAGNOSIS — I5032 Chronic diastolic (congestive) heart failure: Secondary | ICD-10-CM | POA: Diagnosis not present

## 2016-10-05 DIAGNOSIS — L732 Hidradenitis suppurativa: Secondary | ICD-10-CM | POA: Diagnosis not present

## 2016-10-05 DIAGNOSIS — E1122 Type 2 diabetes mellitus with diabetic chronic kidney disease: Secondary | ICD-10-CM | POA: Diagnosis not present

## 2016-10-05 DIAGNOSIS — I130 Hypertensive heart and chronic kidney disease with heart failure and stage 1 through stage 4 chronic kidney disease, or unspecified chronic kidney disease: Secondary | ICD-10-CM | POA: Diagnosis not present

## 2016-10-05 DIAGNOSIS — N186 End stage renal disease: Secondary | ICD-10-CM | POA: Diagnosis not present

## 2016-10-05 DIAGNOSIS — E1121 Type 2 diabetes mellitus with diabetic nephropathy: Secondary | ICD-10-CM | POA: Diagnosis not present

## 2016-10-05 MED ORDER — METRONIDAZOLE 500 MG PO TABS: 500.0000 mg | ORAL_TABLET | Freq: Three times a day (TID) | ORAL | 0 refills | Status: DC

## 2016-10-05 NOTE — Telephone Encounter (Signed)
Thanks so much for your hard work on her behalf Cassie!

## 2016-10-05 NOTE — Telephone Encounter (Signed)
Called [facility] center and put in orders of IV vancomycin and IV ceftazidime x 2 weeks (was unsure of LOT). This will at least get her to her appointment with Dr. Megan Salon next week on 11/14.  If she is to have more than 2 weeks of IV abx, she will need an extension. Finally got ahold of Carianna's mother and told her about plan and sent in Rx for Flagyl #90. [name] agrees and knows the plan.

## 2016-10-05 NOTE — Telephone Encounter (Signed)
Mother [name]) of patient returned phone call. Contact number [phone].Mearl Latin

## 2016-10-05 NOTE — Telephone Encounter (Signed)
Patient mother called, returning call - she is asking to call her back on [phone] - not pt's home number -pr

## 2016-10-05 NOTE — Telephone Encounter (Signed)
It appears that pt has questions on how to use her Dymista Directions are 2 sprays into each nostril daily - this was reviewed with her at time of discharge from the office visit by my self.  LMOM TCB x1 to review usage directions again

## 2016-10-05 NOTE — Telephone Encounter (Signed)
Spoke with pt. I have clarified again with the pt on how to use Dymista. Nothing further was needed.

## 2016-10-05 NOTE — Telephone Encounter (Signed)
lmtcb x1 for pt. 

## 2016-10-06 ENCOUNTER — Telehealth: Payer: Self-pay | Admitting: Internal Medicine

## 2016-10-06 DIAGNOSIS — D631 Anemia in chronic kidney disease: Secondary | ICD-10-CM | POA: Diagnosis not present

## 2016-10-06 DIAGNOSIS — N186 End stage renal disease: Secondary | ICD-10-CM | POA: Diagnosis not present

## 2016-10-06 DIAGNOSIS — E1129 Type 2 diabetes mellitus with other diabetic kidney complication: Secondary | ICD-10-CM | POA: Diagnosis not present

## 2016-10-06 DIAGNOSIS — D509 Iron deficiency anemia, unspecified: Secondary | ICD-10-CM | POA: Diagnosis not present

## 2016-10-06 DIAGNOSIS — L732 Hidradenitis suppurativa: Secondary | ICD-10-CM | POA: Diagnosis not present

## 2016-10-06 DIAGNOSIS — N2581 Secondary hyperparathyroidism of renal origin: Secondary | ICD-10-CM | POA: Diagnosis not present

## 2016-10-06 NOTE — Progress Notes (Signed)
LMOMTCB x 1 

## 2016-10-06 NOTE — Telephone Encounter (Signed)
Location     [facility]   Patient Name     [name]   Email Address     [email or web address]   Date of Birth     03-06-1961   Subject     Prescription clarity   Message     Dr. Virgie Dad. Gave a prescription for a spray to use "2 sprays into the nose daily".   Does that mean 1 spray in each nostril once a day daily or 1 spray in each nostril twice a day?      Mother is aware of how patient should use nasal spray as well as to use the MyChart account to communicate/get responses from our office in a timely manner. Mother aware of CXR results as well.

## 2016-10-09 DIAGNOSIS — N186 End stage renal disease: Secondary | ICD-10-CM | POA: Diagnosis not present

## 2016-10-09 DIAGNOSIS — D509 Iron deficiency anemia, unspecified: Secondary | ICD-10-CM | POA: Diagnosis not present

## 2016-10-09 DIAGNOSIS — L732 Hidradenitis suppurativa: Secondary | ICD-10-CM | POA: Diagnosis not present

## 2016-10-09 DIAGNOSIS — D631 Anemia in chronic kidney disease: Secondary | ICD-10-CM | POA: Diagnosis not present

## 2016-10-09 DIAGNOSIS — E1129 Type 2 diabetes mellitus with other diabetic kidney complication: Secondary | ICD-10-CM | POA: Diagnosis not present

## 2016-10-09 DIAGNOSIS — N2581 Secondary hyperparathyroidism of renal origin: Secondary | ICD-10-CM | POA: Diagnosis not present

## 2016-10-10 ENCOUNTER — Telehealth: Payer: Self-pay | Admitting: Pharmacist

## 2016-10-10 ENCOUNTER — Ambulatory Visit (INDEPENDENT_AMBULATORY_CARE_PROVIDER_SITE_OTHER): Payer: Medicare Other | Admitting: Internal Medicine

## 2016-10-10 DIAGNOSIS — I130 Hypertensive heart and chronic kidney disease with heart failure and stage 1 through stage 4 chronic kidney disease, or unspecified chronic kidney disease: Secondary | ICD-10-CM | POA: Diagnosis not present

## 2016-10-10 DIAGNOSIS — L732 Hidradenitis suppurativa: Secondary | ICD-10-CM | POA: Diagnosis not present

## 2016-10-10 DIAGNOSIS — I5032 Chronic diastolic (congestive) heart failure: Secondary | ICD-10-CM | POA: Diagnosis not present

## 2016-10-10 DIAGNOSIS — R7881 Bacteremia: Secondary | ICD-10-CM

## 2016-10-10 DIAGNOSIS — B955 Unspecified streptococcus as the cause of diseases classified elsewhere: Secondary | ICD-10-CM

## 2016-10-10 DIAGNOSIS — E1122 Type 2 diabetes mellitus with diabetic chronic kidney disease: Secondary | ICD-10-CM | POA: Diagnosis not present

## 2016-10-10 DIAGNOSIS — N186 End stage renal disease: Secondary | ICD-10-CM | POA: Diagnosis not present

## 2016-10-10 DIAGNOSIS — E1121 Type 2 diabetes mellitus with diabetic nephropathy: Secondary | ICD-10-CM | POA: Diagnosis not present

## 2016-10-10 NOTE — Telephone Encounter (Signed)
[place] and extended the ceftazidime and vancomycin until 11/06/16 per Dr. Megan Salon.

## 2016-10-10 NOTE — Assessment & Plan Note (Signed)
It is highly likely that her bacteremia has been cured. She is now been put back on broad empiric anabiotic therapy after developing more drainage from her chronic lesions. I told her that long-term antibiotics or not a good solution for hidradenitis. She is very frustrated and would like a more permanent fix. I did agree to extend her antibiotic therapy until her next visit here. I suggested that she consider a dermatology second opinion.

## 2016-10-10 NOTE — Progress Notes (Signed)
[facility] for Infectious Disease  Patient Active Problem List   Diagnosis Date Noted  . Streptococcal bacteremia   . ESRD (end stage renal disease) on dialysis ([place]) 08/25/2016  . Sepsis affecting skin ([place]) 08/25/2016  . Absolute anemia   . Pressure ulcer 05/01/2015  . Fever 04/30/2015  . Hydradenitis 04/30/2015  . Chronic respiratory failure ([place]) 11/10/2013  . COPD (chronic obstructive pulmonary disease) ([place]) 11/06/2013  . DCM (dilated cardiomyopathy) ([place])   . End stage renal disease ([place]) 08/26/2013  . UTI (urinary tract infection) 07/18/2013  . OSA (obstructive sleep apnea) 07/17/2013  . Chronic diastolic CHF (congestive heart failure) ([place]) 07/16/2013  . SOB (shortness of breath) 07/11/2013  . Metabolic acidosis 77/41/2878  . Leucocytosis 01/24/2013  . Hypertension 11/30/2012  . Atelectasis 11/19/2012  . Sepsis ([place]) 11/10/2012  . Septic shock ([place]) 08/17/2012  . ARF (acute renal failure) ([place]) 06/03/2012  . Hydronephrosis of left kidney 05/28/2012  . Nephrolithiasis 05/28/2012  . Hidradenitis suppurativa 05/21/2012  . Symptomatic anemia 05/21/2012  . DM type 2 (diabetes mellitus, type 2) ([place])   . Chronic kidney disease (CKD), stage V ([place])   . Mood disorder in conditions classified elsewhere 09/06/2010  . ERYTHEMA NODOSUM, HX OF 07/27/2010  . GOITER, MULTINODULAR 05/18/2010  . Morbid obesity ([place]) 05/18/2010  . GERD 05/18/2010    Patient's Medications  New Prescriptions   No medications on file  Previous Medications   ASPIRIN EC 81 MG TABLET    Take 81 mg by mouth daily.   AZELASTINE-FLUTICASONE 137-50 MCG/ACT SUSP    Place 2 sprays into the nose daily.   CALCIUM ACETATE 667 MG TABS    Take 2,001 mg by mouth 3 (three) times daily with meals.    CLINDAMYCIN (CLEOCIN T) 1 % EXTERNAL SOLUTION    Apply 1 application topically 2 (two) times daily. TO THE AFFECTED AREAS   DULOXETINE (CYMBALTA) 60 MG CAPSULE    Take 1 capsule (60 mg total) by  mouth daily.   FAMOTIDINE (PEPCID) 20 MG TABLET    TAKE 1 TABLET BY MOUTH AT BEDTIME   FEEDING SUPPLEMENT, RESOURCE BREEZE, (RESOURCE BREEZE) LIQD    Take 1 Container by mouth 3 (three) times daily with meals.   GLIPIZIDE (GLUCOTROL) 5 MG TABLET    Take 0.5 tablets (2.5 mg total) by mouth daily.   LEVOTHYROXINE (SYNTHROID, LEVOTHROID) 75 MCG TABLET    Take 75 mcg by mouth daily.   METRONIDAZOLE (FLAGYL) 500 MG TABLET    Take 1 tablet (500 mg total) by mouth 3 (three) times daily.   MIDODRINE (PROAMATINE) 10 MG TABLET    Take 1 tablet (10 mg total) by mouth 3 (three) times daily.   MULTIPLE VITAMIN (MULTIVITAMIN) TABLET    Take 1 tablet by mouth daily.   MULTIVITAMIN (RENA-VIT) TABS TABLET    Take 1 tablet by mouth daily.   OXYCODONE-ACETAMINOPHEN (PERCOCET/ROXICET) 5-325 MG TABLET    Take 1-2 tablets by mouth every 6 (six) hours as needed. Patient has not taken since started dialysis 08/2013 due to causes hypotension   PRAVASTATIN (PRAVACHOL) 40 MG TABLET    Take 40 mg by mouth daily. Patient takes in am   PROBIOTIC PRODUCT (PROBIOTIC DAILY PO)    Take 1 tablet by mouth daily.    SENSIPAR 60 MG TABLET    Take 60 mg by mouth daily.    SUCROFERRIC OXYHYDROXIDE (VELPHORO) 500 MG CHEWABLE TABLET    Chew 500 mg  by mouth 3 (three) times daily with meals.   VANCOMYCIN HCL IV    Inject into the vein as directed.  Modified Medications   No medications on file  Discontinued Medications   No medications on file    Subjective: [name] is in for her hospital follow-up visit. She has severe, chronic hidradenitis supparativa. She developed worsening drainage from the lesions under her breast, axilla and buttocks in late September and was hospitalized with sepsis and streptococcal bacteremia. She was seen by my partner, Dr. Tommy Medal, and was treated with IV vancomycin, ceftazidime and oral metronidazole. She completed 4 weeks of therapy on 09/26/2016 and was doing a little better at that time. Her home care  nurse noted increased purulent drainage recently and Dr. Drucilla Schmidt restarted antibiotics on 10/05/2016. Her drainage is improving and is nearly back to her normal baseline. She always has drainage. She states that she saw a dermatologist here in town about 2 years ago but the dermatologist did not want to prescribe Humira because of her other medical conditions.  Review of Systems: Review of Systems  Constitutional: Negative for chills, diaphoresis, fever and weight loss.  Gastrointestinal: Negative for abdominal pain, diarrhea, nausea and vomiting.  Skin:       As noted in history of present illness.    Past Medical History:  Diagnosis Date  . Chronic diastolic CHF (congestive heart failure) ([place])   . Chronic kidney disease (CKD), stage III (moderate)    [name] nephrology lov note 05-12-2013 on pt chart now on HD  . DCM (dilated cardiomyopathy) ([place])    nonischemic - EF now 50-55% by echo 2014  . Depression   . DM type 2 (diabetes mellitus, type 2) ([facility])    diet controlledwith retinopathy and nephropathy  . GERD (gastroesophageal reflux disease)   . H/O cardiac arrest 10/2012  . Hard of hearing 10/2012   now wears 2 hearing aids  . History of hemodialysis dec 2013  . HTN (hypertension)   . Hydradenitis   . Hyperkalemia   . Hyperlipidemia   . Hypothyroidism   . Iron deficiency anemia   . Morbid obesity ([place])   . Multinodular goiter   . Pneumonia Nov 10, 2012  . Sleep apnea    no test yet per mother 10/22/13   . Urinary tract infection    taking antibiotics for 3 days prior to surgery    Social History  Substance Use Topics  . Smoking status: Former Smoker    Packs/day: 0.25    Years: 1.00    Types: Cigarettes    Quit date: 11/28/1979  . Smokeless tobacco: Never Used  . Alcohol use No    Family History  Problem Relation Age of Onset  . Coronary artery disease Mother   . Hypertension Mother   . Diabetes type II Mother   . Coronary artery disease Father   .  Hypertension Father   . Malignant hyperthermia Father   . Cancer Maternal Grandfather     ? Type  . Kidney failure Maternal Grandmother     Allergies  Allergen Reactions  . Rosiglitazone Maleate Swelling and Other (See Comments)    SWELLING REACTION UNSPECIFIED   . Amoxicillin Rash and Other (See Comments)    Tolerated Zosyn 01/2013. [place]  . Amoxicillin-Pot Clavulanate Diarrhea    Objective: Vitals:   10/10/16 1447  BP: 116/77  Pulse: 95  Temp: 98.3 F (36.8 C)  TempSrc: Oral  SpO2: 100%  Weight: Marland Kitchen)  350 lb 8 oz (159 kg)  Height: 5' 6.5" (1.689 m)   Body mass index is 55.72 kg/m.  Physical Exam  Constitutional: She is oriented to person, place, and time.  She is frustrated.  Cardiovascular: Normal rate and regular rhythm.   No murmur heard. Pulmonary/Chest: Breath sounds normal.  Neurological: She is alert and oriented to person, place, and time.  Skin:  Thin bloody drainage from scattered lesions right axilla and under breasts  Psychiatric: Mood and affect normal.    Lab Results    Problem List Items Addressed This Visit      Unprioritized   Streptococcal bacteremia    It is highly likely that her bacteremia has been cured. She is now been put back on broad empiric anabiotic therapy after developing more drainage from her chronic lesions. I told her that long-term antibiotics or not a good solution for hidradenitis. She is very frustrated and would like a more permanent fix. I did agree to extend her antibiotic therapy until her next visit here. I suggested that she consider a dermatology second opinion.          Michel Bickers, MD [facility] for [facility] [phone] pager   [phone] cell 10/10/2016, 3:29 PM

## 2016-10-11 ENCOUNTER — Inpatient Hospital Stay: Payer: BC Managed Care – PPO | Admitting: Infectious Disease

## 2016-10-11 DIAGNOSIS — N2581 Secondary hyperparathyroidism of renal origin: Secondary | ICD-10-CM | POA: Diagnosis not present

## 2016-10-11 DIAGNOSIS — E1129 Type 2 diabetes mellitus with other diabetic kidney complication: Secondary | ICD-10-CM | POA: Diagnosis not present

## 2016-10-11 DIAGNOSIS — D509 Iron deficiency anemia, unspecified: Secondary | ICD-10-CM | POA: Diagnosis not present

## 2016-10-11 DIAGNOSIS — N186 End stage renal disease: Secondary | ICD-10-CM | POA: Diagnosis not present

## 2016-10-11 DIAGNOSIS — L732 Hidradenitis suppurativa: Secondary | ICD-10-CM | POA: Diagnosis not present

## 2016-10-11 DIAGNOSIS — D631 Anemia in chronic kidney disease: Secondary | ICD-10-CM | POA: Diagnosis not present

## 2016-10-12 ENCOUNTER — Encounter (HOSPITAL_BASED_OUTPATIENT_CLINIC_OR_DEPARTMENT_OTHER): Payer: Medicare Other | Attending: Internal Medicine

## 2016-10-12 DIAGNOSIS — S31829A Unspecified open wound of left buttock, initial encounter: Secondary | ICD-10-CM | POA: Diagnosis not present

## 2016-10-12 DIAGNOSIS — J449 Chronic obstructive pulmonary disease, unspecified: Secondary | ICD-10-CM | POA: Insufficient documentation

## 2016-10-12 DIAGNOSIS — E1122 Type 2 diabetes mellitus with diabetic chronic kidney disease: Secondary | ICD-10-CM | POA: Diagnosis not present

## 2016-10-12 DIAGNOSIS — G4730 Sleep apnea, unspecified: Secondary | ICD-10-CM | POA: Diagnosis not present

## 2016-10-12 DIAGNOSIS — S31819A Unspecified open wound of right buttock, initial encounter: Secondary | ICD-10-CM | POA: Diagnosis not present

## 2016-10-12 DIAGNOSIS — S71102A Unspecified open wound, left thigh, initial encounter: Secondary | ICD-10-CM | POA: Diagnosis not present

## 2016-10-12 DIAGNOSIS — N186 End stage renal disease: Secondary | ICD-10-CM | POA: Diagnosis not present

## 2016-10-12 DIAGNOSIS — L732 Hidradenitis suppurativa: Secondary | ICD-10-CM | POA: Insufficient documentation

## 2016-10-12 DIAGNOSIS — S3140XA Unspecified open wound of vagina and vulva, initial encounter: Secondary | ICD-10-CM | POA: Diagnosis not present

## 2016-10-12 DIAGNOSIS — I120 Hypertensive chronic kidney disease with stage 5 chronic kidney disease or end stage renal disease: Secondary | ICD-10-CM | POA: Insufficient documentation

## 2016-10-13 DIAGNOSIS — E1129 Type 2 diabetes mellitus with other diabetic kidney complication: Secondary | ICD-10-CM | POA: Diagnosis not present

## 2016-10-13 DIAGNOSIS — D631 Anemia in chronic kidney disease: Secondary | ICD-10-CM | POA: Diagnosis not present

## 2016-10-13 DIAGNOSIS — L732 Hidradenitis suppurativa: Secondary | ICD-10-CM | POA: Diagnosis not present

## 2016-10-13 DIAGNOSIS — N2581 Secondary hyperparathyroidism of renal origin: Secondary | ICD-10-CM | POA: Diagnosis not present

## 2016-10-13 DIAGNOSIS — D509 Iron deficiency anemia, unspecified: Secondary | ICD-10-CM | POA: Diagnosis not present

## 2016-10-13 DIAGNOSIS — N186 End stage renal disease: Secondary | ICD-10-CM | POA: Diagnosis not present

## 2016-10-15 DIAGNOSIS — N2581 Secondary hyperparathyroidism of renal origin: Secondary | ICD-10-CM | POA: Diagnosis not present

## 2016-10-15 DIAGNOSIS — N186 End stage renal disease: Secondary | ICD-10-CM | POA: Diagnosis not present

## 2016-10-15 DIAGNOSIS — D509 Iron deficiency anemia, unspecified: Secondary | ICD-10-CM | POA: Diagnosis not present

## 2016-10-15 DIAGNOSIS — E1129 Type 2 diabetes mellitus with other diabetic kidney complication: Secondary | ICD-10-CM | POA: Diagnosis not present

## 2016-10-15 DIAGNOSIS — L732 Hidradenitis suppurativa: Secondary | ICD-10-CM | POA: Diagnosis not present

## 2016-10-15 DIAGNOSIS — D631 Anemia in chronic kidney disease: Secondary | ICD-10-CM | POA: Diagnosis not present

## 2016-10-16 DIAGNOSIS — I130 Hypertensive heart and chronic kidney disease with heart failure and stage 1 through stage 4 chronic kidney disease, or unspecified chronic kidney disease: Secondary | ICD-10-CM | POA: Diagnosis not present

## 2016-10-16 DIAGNOSIS — N186 End stage renal disease: Secondary | ICD-10-CM | POA: Diagnosis not present

## 2016-10-16 DIAGNOSIS — E1122 Type 2 diabetes mellitus with diabetic chronic kidney disease: Secondary | ICD-10-CM | POA: Diagnosis not present

## 2016-10-16 DIAGNOSIS — I5032 Chronic diastolic (congestive) heart failure: Secondary | ICD-10-CM | POA: Diagnosis not present

## 2016-10-16 DIAGNOSIS — E1121 Type 2 diabetes mellitus with diabetic nephropathy: Secondary | ICD-10-CM | POA: Diagnosis not present

## 2016-10-16 DIAGNOSIS — L732 Hidradenitis suppurativa: Secondary | ICD-10-CM | POA: Diagnosis not present

## 2016-10-17 ENCOUNTER — Telehealth (HOSPITAL_COMMUNITY): Payer: Self-pay | Admitting: *Deleted

## 2016-10-17 DIAGNOSIS — E1129 Type 2 diabetes mellitus with other diabetic kidney complication: Secondary | ICD-10-CM | POA: Diagnosis not present

## 2016-10-17 DIAGNOSIS — A491 Streptococcal infection, unspecified site: Secondary | ICD-10-CM | POA: Diagnosis not present

## 2016-10-17 DIAGNOSIS — D631 Anemia in chronic kidney disease: Secondary | ICD-10-CM | POA: Diagnosis not present

## 2016-10-17 DIAGNOSIS — N186 End stage renal disease: Secondary | ICD-10-CM | POA: Diagnosis not present

## 2016-10-17 DIAGNOSIS — N2581 Secondary hyperparathyroidism of renal origin: Secondary | ICD-10-CM | POA: Diagnosis not present

## 2016-10-17 DIAGNOSIS — D509 Iron deficiency anemia, unspecified: Secondary | ICD-10-CM | POA: Diagnosis not present

## 2016-10-17 DIAGNOSIS — L732 Hidradenitis suppurativa: Secondary | ICD-10-CM | POA: Diagnosis not present

## 2016-10-17 NOTE — Telephone Encounter (Signed)
Left message on voicemail per DPR in reference to upcoming appointment scheduled on 10/24/16 at 1230 with detailed instructions given per Myocardial Perfusion Study Information Sheet for the test. LM to arrive 15 minutes early, and that it is imperative to arrive on time for appointment to keep from having the test rescheduled. If you need to cancel or reschedule your appointment, please call the office within 24 hours of your appointment. Failure to do so may result in a cancellation of your appointment, and a $50 no show fee. Phone number given for call back for any questions.

## 2016-10-20 DIAGNOSIS — L732 Hidradenitis suppurativa: Secondary | ICD-10-CM | POA: Diagnosis not present

## 2016-10-20 DIAGNOSIS — N186 End stage renal disease: Secondary | ICD-10-CM | POA: Diagnosis not present

## 2016-10-20 DIAGNOSIS — N2581 Secondary hyperparathyroidism of renal origin: Secondary | ICD-10-CM | POA: Diagnosis not present

## 2016-10-20 DIAGNOSIS — D631 Anemia in chronic kidney disease: Secondary | ICD-10-CM | POA: Diagnosis not present

## 2016-10-20 DIAGNOSIS — E1129 Type 2 diabetes mellitus with other diabetic kidney complication: Secondary | ICD-10-CM | POA: Diagnosis not present

## 2016-10-20 DIAGNOSIS — D509 Iron deficiency anemia, unspecified: Secondary | ICD-10-CM | POA: Diagnosis not present

## 2016-10-23 DIAGNOSIS — D631 Anemia in chronic kidney disease: Secondary | ICD-10-CM | POA: Diagnosis not present

## 2016-10-23 DIAGNOSIS — N186 End stage renal disease: Secondary | ICD-10-CM | POA: Diagnosis not present

## 2016-10-23 DIAGNOSIS — E1129 Type 2 diabetes mellitus with other diabetic kidney complication: Secondary | ICD-10-CM | POA: Diagnosis not present

## 2016-10-23 DIAGNOSIS — N2581 Secondary hyperparathyroidism of renal origin: Secondary | ICD-10-CM | POA: Diagnosis not present

## 2016-10-23 DIAGNOSIS — L732 Hidradenitis suppurativa: Secondary | ICD-10-CM | POA: Diagnosis not present

## 2016-10-23 DIAGNOSIS — D509 Iron deficiency anemia, unspecified: Secondary | ICD-10-CM | POA: Diagnosis not present

## 2016-10-24 ENCOUNTER — Ambulatory Visit (HOSPITAL_COMMUNITY): Payer: BC Managed Care – PPO

## 2016-10-25 DIAGNOSIS — L732 Hidradenitis suppurativa: Secondary | ICD-10-CM | POA: Diagnosis not present

## 2016-10-25 DIAGNOSIS — D631 Anemia in chronic kidney disease: Secondary | ICD-10-CM | POA: Diagnosis not present

## 2016-10-25 DIAGNOSIS — E1129 Type 2 diabetes mellitus with other diabetic kidney complication: Secondary | ICD-10-CM | POA: Diagnosis not present

## 2016-10-25 DIAGNOSIS — D509 Iron deficiency anemia, unspecified: Secondary | ICD-10-CM | POA: Diagnosis not present

## 2016-10-25 DIAGNOSIS — N2581 Secondary hyperparathyroidism of renal origin: Secondary | ICD-10-CM | POA: Diagnosis not present

## 2016-10-25 DIAGNOSIS — N186 End stage renal disease: Secondary | ICD-10-CM | POA: Diagnosis not present

## 2016-10-26 ENCOUNTER — Ambulatory Visit (HOSPITAL_COMMUNITY): Payer: BC Managed Care – PPO

## 2016-10-26 DIAGNOSIS — E1129 Type 2 diabetes mellitus with other diabetic kidney complication: Secondary | ICD-10-CM | POA: Diagnosis not present

## 2016-10-26 DIAGNOSIS — N186 End stage renal disease: Secondary | ICD-10-CM | POA: Diagnosis not present

## 2016-10-26 DIAGNOSIS — I130 Hypertensive heart and chronic kidney disease with heart failure and stage 1 through stage 4 chronic kidney disease, or unspecified chronic kidney disease: Secondary | ICD-10-CM | POA: Diagnosis not present

## 2016-10-26 DIAGNOSIS — L732 Hidradenitis suppurativa: Secondary | ICD-10-CM | POA: Diagnosis not present

## 2016-10-26 DIAGNOSIS — Z992 Dependence on renal dialysis: Secondary | ICD-10-CM | POA: Diagnosis not present

## 2016-10-26 DIAGNOSIS — E1122 Type 2 diabetes mellitus with diabetic chronic kidney disease: Secondary | ICD-10-CM | POA: Diagnosis not present

## 2016-10-26 DIAGNOSIS — I5032 Chronic diastolic (congestive) heart failure: Secondary | ICD-10-CM | POA: Diagnosis not present

## 2016-10-26 DIAGNOSIS — E1121 Type 2 diabetes mellitus with diabetic nephropathy: Secondary | ICD-10-CM | POA: Diagnosis not present

## 2016-10-27 DIAGNOSIS — N2581 Secondary hyperparathyroidism of renal origin: Secondary | ICD-10-CM | POA: Diagnosis not present

## 2016-10-27 DIAGNOSIS — N186 End stage renal disease: Secondary | ICD-10-CM | POA: Diagnosis not present

## 2016-10-27 DIAGNOSIS — D509 Iron deficiency anemia, unspecified: Secondary | ICD-10-CM | POA: Diagnosis not present

## 2016-10-27 DIAGNOSIS — E1129 Type 2 diabetes mellitus with other diabetic kidney complication: Secondary | ICD-10-CM | POA: Diagnosis not present

## 2016-10-27 DIAGNOSIS — D631 Anemia in chronic kidney disease: Secondary | ICD-10-CM | POA: Diagnosis not present

## 2016-10-27 DIAGNOSIS — L732 Hidradenitis suppurativa: Secondary | ICD-10-CM | POA: Diagnosis not present

## 2016-10-27 DIAGNOSIS — A491 Streptococcal infection, unspecified site: Secondary | ICD-10-CM | POA: Diagnosis not present

## 2016-10-30 DIAGNOSIS — D631 Anemia in chronic kidney disease: Secondary | ICD-10-CM | POA: Diagnosis not present

## 2016-10-30 DIAGNOSIS — L732 Hidradenitis suppurativa: Secondary | ICD-10-CM | POA: Diagnosis not present

## 2016-10-30 DIAGNOSIS — D509 Iron deficiency anemia, unspecified: Secondary | ICD-10-CM | POA: Diagnosis not present

## 2016-10-30 DIAGNOSIS — E1129 Type 2 diabetes mellitus with other diabetic kidney complication: Secondary | ICD-10-CM | POA: Diagnosis not present

## 2016-10-30 DIAGNOSIS — N186 End stage renal disease: Secondary | ICD-10-CM | POA: Diagnosis not present

## 2016-10-30 DIAGNOSIS — N2581 Secondary hyperparathyroidism of renal origin: Secondary | ICD-10-CM | POA: Diagnosis not present

## 2016-11-01 DIAGNOSIS — L732 Hidradenitis suppurativa: Secondary | ICD-10-CM | POA: Diagnosis not present

## 2016-11-01 DIAGNOSIS — A491 Streptococcal infection, unspecified site: Secondary | ICD-10-CM | POA: Diagnosis not present

## 2016-11-01 DIAGNOSIS — E1129 Type 2 diabetes mellitus with other diabetic kidney complication: Secondary | ICD-10-CM | POA: Diagnosis not present

## 2016-11-01 DIAGNOSIS — N186 End stage renal disease: Secondary | ICD-10-CM | POA: Diagnosis not present

## 2016-11-01 DIAGNOSIS — N2581 Secondary hyperparathyroidism of renal origin: Secondary | ICD-10-CM | POA: Diagnosis not present

## 2016-11-01 DIAGNOSIS — D509 Iron deficiency anemia, unspecified: Secondary | ICD-10-CM | POA: Diagnosis not present

## 2016-11-01 DIAGNOSIS — D631 Anemia in chronic kidney disease: Secondary | ICD-10-CM | POA: Diagnosis not present

## 2016-11-02 ENCOUNTER — Ambulatory Visit: Payer: BC Managed Care – PPO

## 2016-11-02 ENCOUNTER — Ambulatory Visit (INDEPENDENT_AMBULATORY_CARE_PROVIDER_SITE_OTHER): Payer: Medicare Other | Admitting: Podiatry

## 2016-11-02 DIAGNOSIS — M79675 Pain in left toe(s): Secondary | ICD-10-CM | POA: Diagnosis not present

## 2016-11-02 DIAGNOSIS — M79674 Pain in right toe(s): Secondary | ICD-10-CM

## 2016-11-02 DIAGNOSIS — E1149 Type 2 diabetes mellitus with other diabetic neurological complication: Secondary | ICD-10-CM | POA: Diagnosis not present

## 2016-11-02 DIAGNOSIS — B351 Tinea unguium: Secondary | ICD-10-CM

## 2016-11-02 DIAGNOSIS — M722 Plantar fascial fibromatosis: Secondary | ICD-10-CM

## 2016-11-02 NOTE — Patient Instructions (Signed)

## 2016-11-02 NOTE — Progress Notes (Signed)
   Subjective:    Patient ID: [name], female    DOB: 12/22/1960, 55 y.o.   MRN: [id number]  HPI 55 year old female presents the also concerns of thick, painful, elongated toenails that she cannot trim herself. She denies any redness or drainage coming from the toenails the nails are painful with pressure in shoe gear. She also states that she is having pain to the bottom of the heels when he first gets up stand. No recent injury or trauma. No treatment. She does not want an x-ray done today. No other complaints.    Review of Systems  Constitutional: Positive for fatigue.  HENT: Positive for hearing loss and sinus pressure.   Respiratory: Positive for cough.   Endocrine: Positive for cold intolerance.  Neurological: Positive for numbness.  All other systems reviewed and are negative.      Objective:   Physical Exam General: AAO x3, NAD  Dermatological: Nails are hypertrophic, dystrophic, brittle, discolored, elongated 10. No surrounding redness or drainage. Tenderness nails 1-5 bilaterally. No open lesions or pre-ulcerative lesions are identified today.  Vascular: DP 2/4, PT 1/4, CRT less than 3 seconds. There is no pain with calf compression, swelling, warmth, erythema. D  Neruologic: Sensation decreased with Derrel Nip monofilament, decreased vibratory sensation.  Musculoskeletal: There is mild tenderness to palpation along the plantar medial tubercle of the calcaneus at the insertion of plantar fascia on the left and right foot. There is no pain along the course of the plantar fascia within the arch of the foot. Plantar fascia appears to be intact. There is no pain with lateral compression of the calcaneus or pain with vibratory sensation. There is no pain along the course or insertion of the achilles tendon. No other areas of tenderness to bilateral lower extremities. Equinus is present.   Gait: Unassisted, Nonantalgic.     Assessment & Plan:  55 year old female with  symptomatic onychomycosis, diabetic with neuropathy, bilateral heel pain/plantar fasciitis -Treatment options discussed including all alternatives, risks, and complications -Etiology of symptoms were discussed -Patient declined x-ray today. -Discussed with her symptoms are most likely result of plantar fasciitis. Discussed their stretching, icing daily. I also discussed shoe gear modifications as well. If symptoms continue discussed x-ray and possible steroid injections. -Nails debrided 10 without complications or bleeding -Discussed daily foot inspection -Follow-up in 3 months for routine care or sooner if any problems arise. If heel pain is not improving in the next 3-4 weeks to follow-up for this as well. In the meantime, encouraged to call the office with any questions, concerns, change in symptoms.   Celesta Gentile, DPM

## 2016-11-03 DIAGNOSIS — E1129 Type 2 diabetes mellitus with other diabetic kidney complication: Secondary | ICD-10-CM | POA: Diagnosis not present

## 2016-11-03 DIAGNOSIS — N186 End stage renal disease: Secondary | ICD-10-CM | POA: Diagnosis not present

## 2016-11-03 DIAGNOSIS — L732 Hidradenitis suppurativa: Secondary | ICD-10-CM | POA: Diagnosis not present

## 2016-11-03 DIAGNOSIS — D509 Iron deficiency anemia, unspecified: Secondary | ICD-10-CM | POA: Diagnosis not present

## 2016-11-03 DIAGNOSIS — D631 Anemia in chronic kidney disease: Secondary | ICD-10-CM | POA: Diagnosis not present

## 2016-11-03 DIAGNOSIS — N2581 Secondary hyperparathyroidism of renal origin: Secondary | ICD-10-CM | POA: Diagnosis not present

## 2016-11-06 DIAGNOSIS — D631 Anemia in chronic kidney disease: Secondary | ICD-10-CM | POA: Diagnosis not present

## 2016-11-06 DIAGNOSIS — N2581 Secondary hyperparathyroidism of renal origin: Secondary | ICD-10-CM | POA: Diagnosis not present

## 2016-11-06 DIAGNOSIS — N186 End stage renal disease: Secondary | ICD-10-CM | POA: Diagnosis not present

## 2016-11-06 DIAGNOSIS — L732 Hidradenitis suppurativa: Secondary | ICD-10-CM | POA: Diagnosis not present

## 2016-11-06 DIAGNOSIS — E1129 Type 2 diabetes mellitus with other diabetic kidney complication: Secondary | ICD-10-CM | POA: Diagnosis not present

## 2016-11-06 DIAGNOSIS — D509 Iron deficiency anemia, unspecified: Secondary | ICD-10-CM | POA: Diagnosis not present

## 2016-11-07 ENCOUNTER — Encounter: Payer: Self-pay | Admitting: Pulmonary Disease

## 2016-11-07 ENCOUNTER — Ambulatory Visit (HOSPITAL_COMMUNITY)
Admission: RE | Admit: 2016-11-07 | Discharge: 2016-11-07 | Disposition: A | Discharge status: Home / Self Care | Payer: Medicare Other | Source: Ambulatory Visit | Attending: Pulmonary Disease | Admitting: Pulmonary Disease

## 2016-11-07 ENCOUNTER — Ambulatory Visit (INDEPENDENT_AMBULATORY_CARE_PROVIDER_SITE_OTHER): Payer: Medicare Other | Admitting: Pulmonary Disease

## 2016-11-07 VITALS — BP 126/84 | HR 106 | Ht 66.5 in | Wt 350.0 lb

## 2016-11-07 DIAGNOSIS — R05 Cough: Secondary | ICD-10-CM

## 2016-11-07 DIAGNOSIS — R0602 Shortness of breath: Secondary | ICD-10-CM

## 2016-11-07 DIAGNOSIS — R059 Cough, unspecified: Secondary | ICD-10-CM

## 2016-11-07 LAB — PULMONARY FUNCTION TEST
DL/VA % pred: 91 %
DL/VA: 4.67 ml/min/mmHg/L
DLCO unc % pred: 44 %
DLCO unc: 12.25 ml/min/mmHg
FEF 25-75 Post: 2.45 L/sec
FEF 25-75 Pre: 1.6 L/sec
FEF2575-%Change-Post: 53 %
FEF2575-%Pred-Post: 100 %
FEF2575-%Pred-Pre: 65 %
FEV1-%Change-Post: 11 %
FEV1-%PRED-POST: 66 %
FEV1-%PRED-PRE: 59 %
FEV1-PRE: 1.45 L
FEV1-Post: 1.62 L
FEV1FVC-%CHANGE-POST: 9 %
FEV1FVC-%Pred-Pre: 104 %
FEV6-%CHANGE-POST: 1 %
FEV6-%PRED-PRE: 58 %
FEV6-%Pred-Post: 59 %
FEV6-PRE: 1.74 L
FEV6-Post: 1.77 L
FEV6FVC-%PRED-PRE: 103 %
FEV6FVC-%Pred-Post: 103 %
FVC-%Change-Post: 1 %
FVC-%Pred-Post: 57 %
FVC-%Pred-Pre: 56 %
FVC-POST: 1.77 L
FVC-PRE: 1.74 L
POST FEV6/FVC RATIO: 100 %
PRE FEV6/FVC RATIO: 100 %
Post FEV1/FVC ratio: 91 %
Pre FEV1/FVC ratio: 83 %
RV % PRED: 59 %
RV: 1.2 L
TLC % PRED: 52 %
TLC: 2.87 L

## 2016-11-07 MED ORDER — ALBUTEROL SULFATE (2.5 MG/3ML) 0.083% IN NEBU
2.5000 mg | INHALATION_SOLUTION | Freq: Once | RESPIRATORY_TRACT | Status: AC
  Administered 2016-11-07: 2.5 mg via RESPIRATORY_TRACT

## 2016-11-07 NOTE — Progress Notes (Signed)
[name]    [id number]    [birth year]  Primary Care Physician:WHITE,CYNTHIA S, MD  Referring Physician: Harlan Stains, MD [place], [place] [address]  Chief complaint:  Follow up for dyspnea, cough.   HPI: [name] is a 55-year-old with past mental history of end-stage renal disease on hemodialysis, DM, HTN, morbid obesity. She was last seen by Dr. Melvyn Novas in 2015 for dyspnea which is thought to be secondary to her obesity. She has recurrent admissions from sepsis secondary to Hidradenitis. She was last admitted in October 2017 for sepsis,Hidradenitis and Streptococcus Salivarius bacteremia. She follows with Dr. Radford Pax for chronic diastolic heart failure and dilated cardiomyopathy.  Her chief complaint is chronic cough for the past 2-3 weeks. She has clear mucus production. She has chronic dyspnea on exertion which is unchanged. She denies any chest pain, palpitation, wheezing, fevers, chills. She has chronic sinusitis and postnasal drip, no symptoms of acid reflux. She has very remote smoking history and no known exposures.  At last visit she was started on chlorpheniramine and dymista. She reports improvement in cough. She denies any dyspnea, cough, sputum production, wheezing, chest pain, palpitation.  Outpatient Encounter Prescriptions as of 11/07/2016  Medication Sig  . aspirin EC 81 MG tablet Take 81 mg by mouth daily.  . Azelastine-Fluticasone 137-50 MCG/ACT SUSP Place 2 sprays into the nose daily.  . Calcium Acetate 667 MG TABS Take 2,001 mg by mouth 2 (two) times daily with a meal.   . DULoxetine (CYMBALTA) 60 MG capsule Take 1 capsule (60 mg total) by mouth daily.  . famotidine (PEPCID) 20 MG tablet TAKE 1 TABLET BY MOUTH AT BEDTIME (Patient taking differently: Take 20 mg by mouth at bedtime)  . feeding supplement, RESOURCE BREEZE, (RESOURCE BREEZE) LIQD Take 1 Container by mouth 3 (three) times daily with meals.  Marland Kitchen glipiZIDE (GLUCOTROL) 5 MG  tablet Take 0.5 tablets (2.5 mg total) by mouth daily.  Marland Kitchen levothyroxine (SYNTHROID, LEVOTHROID) 75 MCG tablet Take 75 mcg by mouth daily.  . metroNIDAZOLE (FLAGYL) 500 MG tablet Take 500 mg by mouth 3 (three) times daily.  . midodrine (PROAMATINE) 10 MG tablet Take 1 tablet (10 mg total) by mouth 3 (three) times daily. (Patient taking differently: Take 10 mg by mouth daily. )  . Multiple Vitamin (MULTIVITAMIN) tablet Take 1 tablet by mouth daily.  . multivitamin (RENA-VIT) TABS tablet Take 1 tablet by mouth daily.  Marland Kitchen oxyCODONE-acetaminophen (PERCOCET/ROXICET) 5-325 MG tablet Take 1-2 tablets by mouth every 6 (six) hours as needed. Patient has not taken since started dialysis 08/2013 due to causes hypotension (Patient taking differently: Take 1-2 tablets by mouth every 6 (six) hours as needed for moderate pain. )  . pravastatin (PRAVACHOL) 40 MG tablet Take 40 mg by mouth daily. Patient takes in am  . Probiotic Product (PROBIOTIC DAILY PO) Take 1 tablet by mouth daily.   . sucroferric oxyhydroxide (VELPHORO) 500 MG chewable tablet Chew 500 mg by mouth 3 (three) times daily with meals.  Marland Kitchen VANCOMYCIN HCL IV Inject into the vein as directed.   No facility-administered encounter medications on file as of 11/07/2016.     Allergies as of 11/07/2016 - Review Complete 11/07/2016  Allergen Reaction Noted  . Rosiglitazone maleate Swelling and Other (See Comments)   . Amoxicillin Rash and Other (See Comments) 08/03/2011  . Amoxicillin-pot clavulanate Diarrhea 01/19/2012    Past Medical History:  Diagnosis Date  . Chronic diastolic CHF (  congestive heart failure) ([facility])   . Chronic kidney disease (CKD), stage III (moderate)    [name] nephrology lov note 05-12-2013 on pt chart now on HD  . DCM (dilated cardiomyopathy) ([facility])    nonischemic - EF now 50-55% by echo 2014  . Depression   . DM type 2 (diabetes mellitus, type 2) ([facility])    diet controlledwith retinopathy and nephropathy  . GERD  (gastroesophageal reflux disease)   . H/O cardiac arrest 10/2012  . Hard of hearing 10/2012   now wears 2 hearing aids  . History of hemodialysis dec 2013  . HTN (hypertension)   . Hydradenitis   . Hyperkalemia   . Hyperlipidemia   . Hypothyroidism   . Iron deficiency anemia   . Morbid obesity ([facility])   . Multinodular goiter   . Pneumonia Nov 10, 2012  . Sleep apnea    no test yet per mother 10/22/13   . Urinary tract infection    taking antibiotics for 3 days prior to surgery    Past Surgical History:  Procedure Laterality Date  . AV FISTULA PLACEMENT Left 07/25/2013   Procedure: ARTERIOVENOUS (AV) FISTULA CREATION ;  Surgeon: Rosetta Posner, MD;  Location: [facility];  Service: Vascular;  Laterality: Left;  . CYSTOSCOPY W/ RETROGRADES  11/16/2012   Procedure: CYSTOSCOPY WITH RETROGRADE PYELOGRAM;  Surgeon: Reece Packer, MD;  [facility];  Service: Urology;  Laterality: Bilateral;  CYSTOSCOPY,BILATERAL RETROGRADE PYELOGRAM/ REMOVAL LEFT URETERAL STENT/ FULGERATION BLADDER MUCOSA/ INSERTION RIGHT URETERAL STENT  . CYSTOSCOPY W/ URETERAL STENT PLACEMENT  05/28/2012   Procedure: CYSTOSCOPY WITH RETROGRADE PYELOGRAM/URETERAL STENT PLACEMENT;  Surgeon: Reece Packer, MD;  [facility];  Service: Urology;  Laterality: Left;  . CYSTOSCOPY WITH RETROGRADE PYELOGRAM, URETEROSCOPY AND STENT PLACEMENT Right 06/23/2013   Procedure: CYSTOSCOPY WITH RIGHT URETEROSCOPY, RIGHT  RETROGRADE PYELOGRAM, WITH LASER LIPOTRIPSY AND RIGHT URETERAL STENT EXCHANGE ;  Surgeon: Molli Hazard, MD;  [facility];  Service: Urology;  Laterality: Right;  STENT EXCHANGE    . CYSTOSCOPY WITH RETROGRADE PYELOGRAM, URETEROSCOPY AND STENT PLACEMENT Right 11/06/2013   Procedure: CYSTOSCOPY WITH RETROGRADE PYELOGRAM, URETEROSCOPY STONE REMOVAL AND STENT REMOVAL;  Surgeon: Molli Hazard, MD;  [facility];  Service: Urology;  Laterality: Right;  . HOLMIUM LASER APPLICATION N/A 7/84/6962    Procedure: HOLMIUM LASER APPLICATION;  Surgeon: Molli Hazard, MD;  [facility];  Service: Urology;  Laterality: N/A;  . INSERTION OF DIALYSIS CATHETER N/A 09/24/2013   Procedure: INSERTION OF DIALYSIS CATHETER;  Surgeon: Rosetta Posner, MD;  Location: [facility];  Service: Vascular;  Laterality: N/A;  . PORT-A-CATH REMOVAL    . PORTACATH PLACEMENT    . REVISON OF ARTERIOVENOUS FISTULA Left 07/25/2016   Procedure: REVISON OF LEFT ARTERIOVENOUS FISTULA;  Surgeon: Elam Dutch, MD;  [facility];  Service: Vascular;  Laterality: Left;    Family History  Problem Relation Age of Onset  . Coronary artery disease Mother   . Hypertension Mother   . Diabetes type II Mother   . Coronary artery disease Father   . Hypertension Father   . Malignant hyperthermia Father   . Cancer Maternal Grandfather     ? Type  . Kidney failure Maternal Grandmother     Social History   Social History  . Marital status: Married    Spouse name: N/A  . Number of children: N/A  . Years of education: N/A   Occupational History  . Not  on file.   Social History Main Topics  . Smoking status: Former Smoker    Packs/day: 0.25    Years: 1.00    Types: Cigarettes    Quit date: 11/28/1979  . Smokeless tobacco: Never Used  . Alcohol use No  . Drug use: No  . Sexual activity: No   Other Topics Concern  . Not on file   Social History Narrative  . No narrative on file   Review of systems: Review of Systems  Constitutional: Negative for fever and chills.  HENT: Negative.   Eyes: Negative for blurred vision.  Respiratory: as per HPI  Cardiovascular: Negative for chest pain and palpitations.  Gastrointestinal: Negative for vomiting, diarrhea, blood per rectum. Genitourinary: Negative for dysuria, urgency, frequency and hematuria.  Musculoskeletal: Negative for myalgias, back pain and joint pain.  Skin: Negative for itching and rash.  Neurological: Negative for dizziness, tremors, focal  weakness, seizures and loss of consciousness.  Endo/Heme/Allergies: Negative for environmental allergies.  Psychiatric/Behavioral: Negative for depression, suicidal ideas and hallucinations.  All other systems reviewed and are negative.  Physical Exam: Blood pressure 124/76, pulse 100, last menstrual period 06/13/2013, SpO2 99 %. Gen:      No acute distress, obese HEENT:  EOMI, sclera anicteric Neck:     No masses; no thyromegaly Lungs:    Clear to auscultation bilaterally; normal respiratory effort CV:         Regular rate and rhythm; no murmurs Abd:      + bowel sounds; soft, non-tender; no palpable masses, no distension Ext:    No edema; adequate peripheral perfusion Skin:      Warm and dry; no rash Neuro: alert and oriented x 3 Psych: normal mood and affect  Data Reviewed: CXR 08/25/16- Mild vascular congestion. Bibasilar atelectasis.  CXR 10/03/16-mild  interstitial prominence, bronchitis Images reviewed.  Echo 08/31/16-   Left ventricle: The cavity size was normal. Wall thickness was   increased in a pattern of mild LVH. Systolic function was normal.   The estimated ejection fraction was in the range of 60% to 65%.   Wall motion was normal; there were no regional wall motion   abnormalities. Left ventricular diastolic function parameters   were normal.  PFTs 11/07/16 FVC 1.77 (57%) FEV1 1.62 (66%) F/F 91 TLC 52% RV/TLC 112% DLCO 44% Moderate restriction with diffusion impairment. Reduced diffusion capacity corrects for alveolar volume.  Assessment:  #1 Dyspnea on exertion. Suspect that this is secondary to obesity and deconditioning. There is no evidence of lung disease on prior imaging and COPD is not likely in setting of minimal smoking history. PFTs reviewed today. It shows restriction and DLCO impairment. The DLCO impairment corrects for alveolar volume. I suspect these are secondary to her obesity and body habitus. I do not see any evidence of interstitial lung  disease on her recent chest x-ray.  #2 Cough Likely upper airway cough syndrome from post nasal drip, sinus congestion. Improved with chlorpheniramine 8 mg tid and dymista nasal spray.  Plan/Recommendations: - Continue Chlorpheniramine 8 mg tid and dymista nasal spray.  Marshell Garfinkel MD [facility] Pager [phone] 11/07/2016, 3:30 PM  CC: Harlan Stains, MD

## 2016-11-07 NOTE — Patient Instructions (Signed)
Pulmonary function tests were reviewed with you today. They do not show any major lung abnormality.  Return to clinic in 6 months

## 2016-11-08 DIAGNOSIS — D631 Anemia in chronic kidney disease: Secondary | ICD-10-CM | POA: Diagnosis not present

## 2016-11-08 DIAGNOSIS — N186 End stage renal disease: Secondary | ICD-10-CM | POA: Diagnosis not present

## 2016-11-08 DIAGNOSIS — E1129 Type 2 diabetes mellitus with other diabetic kidney complication: Secondary | ICD-10-CM | POA: Diagnosis not present

## 2016-11-08 DIAGNOSIS — N2581 Secondary hyperparathyroidism of renal origin: Secondary | ICD-10-CM | POA: Diagnosis not present

## 2016-11-08 DIAGNOSIS — L732 Hidradenitis suppurativa: Secondary | ICD-10-CM | POA: Diagnosis not present

## 2016-11-08 DIAGNOSIS — A491 Streptococcal infection, unspecified site: Secondary | ICD-10-CM | POA: Diagnosis not present

## 2016-11-08 DIAGNOSIS — D509 Iron deficiency anemia, unspecified: Secondary | ICD-10-CM | POA: Diagnosis not present

## 2016-11-09 ENCOUNTER — Encounter: Payer: Self-pay | Admitting: Infectious Disease

## 2016-11-09 ENCOUNTER — Ambulatory Visit (INDEPENDENT_AMBULATORY_CARE_PROVIDER_SITE_OTHER): Payer: Medicare Other | Admitting: Infectious Disease

## 2016-11-09 VITALS — BP 139/83 | HR 98 | Temp 97.9°F

## 2016-11-09 DIAGNOSIS — N186 End stage renal disease: Secondary | ICD-10-CM | POA: Diagnosis not present

## 2016-11-09 DIAGNOSIS — B955 Unspecified streptococcus as the cause of diseases classified elsewhere: Secondary | ICD-10-CM

## 2016-11-09 DIAGNOSIS — R6521 Severe sepsis with septic shock: Secondary | ICD-10-CM | POA: Diagnosis not present

## 2016-11-09 DIAGNOSIS — Z992 Dependence on renal dialysis: Secondary | ICD-10-CM | POA: Diagnosis not present

## 2016-11-09 DIAGNOSIS — R7881 Bacteremia: Secondary | ICD-10-CM | POA: Diagnosis not present

## 2016-11-09 DIAGNOSIS — A419 Sepsis, unspecified organism: Secondary | ICD-10-CM | POA: Diagnosis not present

## 2016-11-09 DIAGNOSIS — L732 Hidradenitis suppurativa: Secondary | ICD-10-CM | POA: Diagnosis not present

## 2016-11-09 MED ORDER — SULFAMETHOXAZOLE-TRIMETHOPRIM 800-160 MG PO TABS: 2.0000 | ORAL_TABLET | Freq: Two times a day (BID) | ORAL | 2 refills | Status: DC

## 2016-11-09 NOTE — Progress Notes (Signed)
Subjective:   Chief complaint is still drainage from her at HS  lesions   Patient ID: [name], female    DOB: [birth year], 55 y.o.   MRN: [id number]  HPI  [name] is a 55 year old lady with morbid obesity hidradenitis with chronic kidney disease on hemodialysis who developed streptococcal bacteremia when she was an inpatient and whom we treated with vancomycin for streptococcal bacteremia along with ceftaz edema and oral Flagyl to cover her at [facility] which was flaring. After she completed the IV antibiotics for hidradenitis flared again and her antibiotics were extended by Dr. Megan Salon after he saw her in clinic.  Today she is doing recently [place] kidneys have drainage from her fistula on the back and in her right groin but this is much less so than when she was off antibiotics. I proposed a trial of oral Bactrim which pharmacy worked with me to with a dose given change her to once she is done with her IV antibiotics.  Past Medical History:  Diagnosis Date  . Chronic diastolic CHF (congestive heart failure) ([facility])   . Chronic kidney disease (CKD), stage III (moderate)    [name] nephrology lov note 05-12-2013 on pt chart now on HD  . DCM (dilated cardiomyopathy) ([place])    nonischemic - EF now 50-55% by echo 2014  . Depression   . DM type 2 (diabetes mellitus, type 2) ([facility])    diet controlledwith retinopathy and nephropathy  . GERD (gastroesophageal reflux disease)   . H/O cardiac arrest 10/2012  . Hard of hearing 10/2012   now wears 2 hearing aids  . History of hemodialysis dec 2013  . HTN (hypertension)   . Hydradenitis   . Hyperkalemia   . Hyperlipidemia   . Hypothyroidism   . Iron deficiency anemia   . Morbid obesity ([place])   . Multinodular goiter   . Pneumonia Nov 10, 2012  . Sleep apnea    no test yet per mother 10/22/13   . Urinary tract infection    taking antibiotics for 3 days prior to surgery    Past Surgical History:  Procedure Laterality Date  . AV FISTULA  PLACEMENT Left 07/25/2013   Procedure: ARTERIOVENOUS (AV) FISTULA CREATION ;  Surgeon: Rosetta Posner, MD;  Location: [facility];  Service: Vascular;  Laterality: Left;  . CYSTOSCOPY W/ RETROGRADES  11/16/2012   Procedure: CYSTOSCOPY WITH RETROGRADE PYELOGRAM;  Surgeon: Reece Packer, MD;  [facility];  Service: Urology;  Laterality: Bilateral;  CYSTOSCOPY,BILATERAL RETROGRADE PYELOGRAM/ REMOVAL LEFT URETERAL STENT/ FULGERATION BLADDER MUCOSA/ INSERTION RIGHT URETERAL STENT  . CYSTOSCOPY W/ URETERAL STENT PLACEMENT  05/28/2012   Procedure: CYSTOSCOPY WITH RETROGRADE PYELOGRAM/URETERAL STENT PLACEMENT;  Surgeon: Reece Packer, MD;  [facility];  Service: Urology;  Laterality: Left;  . CYSTOSCOPY WITH RETROGRADE PYELOGRAM, URETEROSCOPY AND STENT PLACEMENT Right 06/23/2013   Procedure: CYSTOSCOPY WITH RIGHT URETEROSCOPY, RIGHT  RETROGRADE PYELOGRAM, WITH LASER LIPOTRIPSY AND RIGHT URETERAL STENT EXCHANGE ;  Surgeon: Molli Hazard, MD;  [facility];  Service: Urology;  Laterality: Right;  STENT EXCHANGE    . CYSTOSCOPY WITH RETROGRADE PYELOGRAM, URETEROSCOPY AND STENT PLACEMENT Right 11/06/2013   Procedure: CYSTOSCOPY WITH RETROGRADE PYELOGRAM, URETEROSCOPY STONE REMOVAL AND STENT REMOVAL;  Surgeon: Molli Hazard, MD;  [facility];  Service: Urology;  Laterality: Right;  . HOLMIUM LASER APPLICATION N/A 02/17/4009   Procedure: HOLMIUM LASER APPLICATION;  Surgeon: Molli Hazard, MD;  [facility];  Service: Urology;  Laterality: N/A;  .  INSERTION OF DIALYSIS CATHETER N/A 09/24/2013   Procedure: INSERTION OF DIALYSIS CATHETER;  Surgeon: Rosetta Posner, MD;  Location: [facility];  Service: Vascular;  Laterality: N/A;  . PORT-A-CATH REMOVAL    . PORTACATH PLACEMENT    . REVISON OF ARTERIOVENOUS FISTULA Left 07/25/2016   Procedure: REVISON OF LEFT ARTERIOVENOUS FISTULA;  Surgeon: Elam Dutch, MD;  Location: [facility];  Service: Vascular;  Laterality: Left;    Family  History  Problem Relation Age of Onset  . Coronary artery disease Mother   . Hypertension Mother   . Diabetes type II Mother   . Coronary artery disease Father   . Hypertension Father   . Malignant hyperthermia Father   . Cancer Maternal Grandfather     ? Type  . Kidney failure Maternal Grandmother       Social History   Social History  . Marital status: Married    Spouse name: N/A  . Number of children: N/A  . Years of education: N/A   Social History Main Topics  . Smoking status: Former Smoker    Packs/day: 0.25    Years: 1.00    Types: Cigarettes    Quit date: 11/28/1979  . Smokeless tobacco: Never Used  . Alcohol use No  . Drug use: No  . Sexual activity: No   Other Topics Concern  . None   Social History Narrative  . None    Allergies  Allergen Reactions  . Rosiglitazone Maleate Swelling and Other (See Comments)    SWELLING REACTION UNSPECIFIED   . Amoxicillin Rash and Other (See Comments)    Tolerated Zosyn 01/2013. [place]  . Amoxicillin-Pot Clavulanate Diarrhea     Current Outpatient Prescriptions:  .  aspirin EC 81 MG tablet, Take 81 mg by mouth daily., Disp: , Rfl:  .  Azelastine-Fluticasone 137-50 MCG/ACT SUSP, Place 2 sprays into the nose daily., Disp: 1 Bottle, Rfl: 3 .  Calcium Acetate 667 MG TABS, Take 2,001 mg by mouth 2 (two) times daily with a meal. , Disp: , Rfl:  .  clindamycin (CLEOCIN T) 1 % external solution, , Disp: , Rfl:  .  DULoxetine (CYMBALTA) 60 MG capsule, Take 1 capsule (60 mg total) by mouth daily., Disp: 30 capsule, Rfl: 0 .  famotidine (PEPCID) 20 MG tablet, TAKE 1 TABLET BY MOUTH AT BEDTIME (Patient taking differently: Take 20 mg by mouth at bedtime), Disp: 30 tablet, Rfl: 0 .  feeding supplement, RESOURCE BREEZE, (RESOURCE BREEZE) LIQD, Take 1 Container by mouth 3 (three) times daily with meals., Disp: , Rfl:  .  glipiZIDE (GLUCOTROL) 5 MG tablet, Take 0.5 tablets (2.5 mg total) by mouth daily., Disp: 30 tablet, Rfl: 0 .   levothyroxine (SYNTHROID, LEVOTHROID) 75 MCG tablet, Take 75 mcg by mouth daily., Disp: , Rfl: 0 .  metroNIDAZOLE (FLAGYL) 500 MG tablet, Take 500 mg by mouth 3 (three) times daily., Disp: , Rfl:  .  midodrine (PROAMATINE) 10 MG tablet, Take 1 tablet (10 mg total) by mouth 3 (three) times daily. (Patient taking differently: Take 10 mg by mouth daily. ), Disp: 90 tablet, Rfl: 0 .  Multiple Vitamin (MULTIVITAMIN) tablet, Take 1 tablet by mouth daily., Disp: , Rfl:  .  multivitamin (RENA-VIT) TABS tablet, Take 1 tablet by mouth daily., Disp: , Rfl: 6 .  oxyCODONE-acetaminophen (PERCOCET/ROXICET) 5-325 MG tablet, Take 1-2 tablets by mouth every 6 (six) hours as needed. Patient has not taken since started dialysis 08/2013 due to causes hypotension (Patient  taking differently: Take 1-2 tablets by mouth every 6 (six) hours as needed for moderate pain. ), Disp: 6 tablet, Rfl: 0 .  pravastatin (PRAVACHOL) 40 MG tablet, Take 40 mg by mouth daily. Patient takes in am, Disp: , Rfl:  .  Probiotic Product (PROBIOTIC DAILY PO), Take 1 tablet by mouth daily. , Disp: , Rfl:  .  sucroferric oxyhydroxide (VELPHORO) 500 MG chewable tablet, Chew 500 mg by mouth 3 (three) times daily with meals., Disp: , Rfl:  .  sulfamethoxazole-trimethoprim (BACTRIM DS,SEPTRA DS) 800-160 MG tablet, Take 2 tablets by mouth 2 (two) times daily., Disp: 120 tablet, Rfl: 2 .  VANCOMYCIN HCL IV, Inject into the vein as directed., Disp: , Rfl:     Review of Systems  Constitutional: Negative for chills, diaphoresis and fever.  HENT: Negative for congestion, hearing loss, sore throat and tinnitus.   Respiratory: Negative for cough, shortness of breath and wheezing.   Cardiovascular: Negative for chest pain, palpitations and leg swelling.  Gastrointestinal: Negative for abdominal pain, blood in stool, constipation, diarrhea, nausea and vomiting.  Genitourinary: Negative for dysuria, flank pain and hematuria.  Musculoskeletal: Negative for  back pain and myalgias.  Skin: Positive for color change, rash and wound.  Neurological: Negative for dizziness, weakness and headaches.  Hematological: Does not bruise/bleed easily.  Psychiatric/Behavioral: Negative for suicidal ideas. The patient is nervous/anxious.        Objective:   Physical Exam  Constitutional: She is oriented to person, place, and time. She appears well-developed and well-nourished. No distress.  HENT:  Head: Normocephalic and atraumatic.  Eyes: Conjunctivae and EOM are normal. No scleral icterus.  Neck: Normal range of motion. Neck supple.  Cardiovascular: Normal rate and regular rhythm.   Pulmonary/Chest: Effort normal. No respiratory distress. She has no wheezes.  Abdominal: She exhibits no distension.  Musculoskeletal: She exhibits edema.  Neurological: She is alert and oriented to person, place, and time. Coordination normal.  Skin: Skin is warm and dry. She is not diaphoretic.  Psychiatric: She has a normal mood and affect. Her behavior is normal. Judgment and thought content normal.    Right groin lesion 11/09/2016:          Assessment & Plan:   Hidradenitis suppurativa: V finish her current course of IV vancomycin and ceftazidime with dialysis and oral Flagyl. Once this is done she can start oral Bactrim and we believe an appropriate dose would be to double strength tablets twice daily. Her potassium will be monitored with analysis and can be adjusted based on dialysate. We would need to draw a CBC to check for thrombus cytopenia on this regimen. I like her to double check it with her Nephrologist before starting this regimen. If Bactrim is off the table will do doxycycline 100 mg twice daily we'll see how well she does on a protracted oral and about a regimen of a few months  It will be helpful for dermatologist was willing to be involved in her care and use other drugs apparently she has not been offered much beyond topical clindamycin having  seen dermatologist at [facility] and she said [facility] although I did not remember seeing documentation from [place] obesity  ESRD on HD  Streptococcal bacteremia: Resolved  I spent greater than 25 minutes with the patient including greater than 50% of time in face to face counsel of the patient and her mother with regards to her hidradenitis her end-stage renal disease on dialysis for streptococcal bacteremia  and in coordination of her care.

## 2016-11-10 DIAGNOSIS — N2581 Secondary hyperparathyroidism of renal origin: Secondary | ICD-10-CM | POA: Diagnosis not present

## 2016-11-10 DIAGNOSIS — E1129 Type 2 diabetes mellitus with other diabetic kidney complication: Secondary | ICD-10-CM | POA: Diagnosis not present

## 2016-11-10 DIAGNOSIS — N186 End stage renal disease: Secondary | ICD-10-CM | POA: Diagnosis not present

## 2016-11-10 DIAGNOSIS — D509 Iron deficiency anemia, unspecified: Secondary | ICD-10-CM | POA: Diagnosis not present

## 2016-11-10 DIAGNOSIS — D631 Anemia in chronic kidney disease: Secondary | ICD-10-CM | POA: Diagnosis not present

## 2016-11-10 DIAGNOSIS — L732 Hidradenitis suppurativa: Secondary | ICD-10-CM | POA: Diagnosis not present

## 2016-11-13 DIAGNOSIS — L732 Hidradenitis suppurativa: Secondary | ICD-10-CM | POA: Diagnosis not present

## 2016-11-13 DIAGNOSIS — E1129 Type 2 diabetes mellitus with other diabetic kidney complication: Secondary | ICD-10-CM | POA: Diagnosis not present

## 2016-11-13 DIAGNOSIS — N2581 Secondary hyperparathyroidism of renal origin: Secondary | ICD-10-CM | POA: Diagnosis not present

## 2016-11-13 DIAGNOSIS — D509 Iron deficiency anemia, unspecified: Secondary | ICD-10-CM | POA: Diagnosis not present

## 2016-11-13 DIAGNOSIS — D631 Anemia in chronic kidney disease: Secondary | ICD-10-CM | POA: Diagnosis not present

## 2016-11-13 DIAGNOSIS — N186 End stage renal disease: Secondary | ICD-10-CM | POA: Diagnosis not present

## 2016-11-15 ENCOUNTER — Telehealth: Payer: Self-pay | Admitting: *Deleted

## 2016-11-15 DIAGNOSIS — N2581 Secondary hyperparathyroidism of renal origin: Secondary | ICD-10-CM | POA: Diagnosis not present

## 2016-11-15 DIAGNOSIS — N186 End stage renal disease: Secondary | ICD-10-CM | POA: Diagnosis not present

## 2016-11-15 DIAGNOSIS — L732 Hidradenitis suppurativa: Secondary | ICD-10-CM | POA: Diagnosis not present

## 2016-11-15 DIAGNOSIS — D631 Anemia in chronic kidney disease: Secondary | ICD-10-CM | POA: Diagnosis not present

## 2016-11-15 DIAGNOSIS — E1129 Type 2 diabetes mellitus with other diabetic kidney complication: Secondary | ICD-10-CM | POA: Diagnosis not present

## 2016-11-15 DIAGNOSIS — D509 Iron deficiency anemia, unspecified: Secondary | ICD-10-CM | POA: Diagnosis not present

## 2016-11-15 NOTE — Telephone Encounter (Signed)
Just need a CBC w diff about a week after starting this and would route to [place] and [place] and ID on call MD

## 2016-11-15 NOTE — Telephone Encounter (Signed)
Patient's mother calling to schedule lab appointment. Patient on MWF dialysis. She states that [name] started the bactrim 12/19.   Please advise on lab orders. Does she need follow up with pharmacy at this time? Landis Gandy, RN

## 2016-11-17 DIAGNOSIS — N186 End stage renal disease: Secondary | ICD-10-CM | POA: Diagnosis not present

## 2016-11-17 DIAGNOSIS — N2581 Secondary hyperparathyroidism of renal origin: Secondary | ICD-10-CM | POA: Diagnosis not present

## 2016-11-17 DIAGNOSIS — D509 Iron deficiency anemia, unspecified: Secondary | ICD-10-CM | POA: Diagnosis not present

## 2016-11-17 DIAGNOSIS — D631 Anemia in chronic kidney disease: Secondary | ICD-10-CM | POA: Diagnosis not present

## 2016-11-17 DIAGNOSIS — L732 Hidradenitis suppurativa: Secondary | ICD-10-CM | POA: Diagnosis not present

## 2016-11-17 DIAGNOSIS — E1129 Type 2 diabetes mellitus with other diabetic kidney complication: Secondary | ICD-10-CM | POA: Diagnosis not present

## 2016-11-19 DIAGNOSIS — D509 Iron deficiency anemia, unspecified: Secondary | ICD-10-CM | POA: Diagnosis not present

## 2016-11-19 DIAGNOSIS — N186 End stage renal disease: Secondary | ICD-10-CM | POA: Diagnosis not present

## 2016-11-19 DIAGNOSIS — D631 Anemia in chronic kidney disease: Secondary | ICD-10-CM | POA: Diagnosis not present

## 2016-11-19 DIAGNOSIS — E1129 Type 2 diabetes mellitus with other diabetic kidney complication: Secondary | ICD-10-CM | POA: Diagnosis not present

## 2016-11-19 DIAGNOSIS — N2581 Secondary hyperparathyroidism of renal origin: Secondary | ICD-10-CM | POA: Diagnosis not present

## 2016-11-19 DIAGNOSIS — L732 Hidradenitis suppurativa: Secondary | ICD-10-CM | POA: Diagnosis not present

## 2016-11-22 DIAGNOSIS — N2581 Secondary hyperparathyroidism of renal origin: Secondary | ICD-10-CM | POA: Diagnosis not present

## 2016-11-22 DIAGNOSIS — E1129 Type 2 diabetes mellitus with other diabetic kidney complication: Secondary | ICD-10-CM | POA: Diagnosis not present

## 2016-11-22 DIAGNOSIS — L732 Hidradenitis suppurativa: Secondary | ICD-10-CM | POA: Diagnosis not present

## 2016-11-22 DIAGNOSIS — N186 End stage renal disease: Secondary | ICD-10-CM | POA: Diagnosis not present

## 2016-11-22 DIAGNOSIS — D631 Anemia in chronic kidney disease: Secondary | ICD-10-CM | POA: Diagnosis not present

## 2016-11-22 DIAGNOSIS — D509 Iron deficiency anemia, unspecified: Secondary | ICD-10-CM | POA: Diagnosis not present

## 2016-11-24 DIAGNOSIS — N186 End stage renal disease: Secondary | ICD-10-CM | POA: Diagnosis not present

## 2016-11-24 DIAGNOSIS — D509 Iron deficiency anemia, unspecified: Secondary | ICD-10-CM | POA: Diagnosis not present

## 2016-11-24 DIAGNOSIS — N2581 Secondary hyperparathyroidism of renal origin: Secondary | ICD-10-CM | POA: Diagnosis not present

## 2016-11-24 DIAGNOSIS — L732 Hidradenitis suppurativa: Secondary | ICD-10-CM | POA: Diagnosis not present

## 2016-11-24 DIAGNOSIS — E1129 Type 2 diabetes mellitus with other diabetic kidney complication: Secondary | ICD-10-CM | POA: Diagnosis not present

## 2016-11-24 DIAGNOSIS — D631 Anemia in chronic kidney disease: Secondary | ICD-10-CM | POA: Diagnosis not present

## 2016-11-26 DIAGNOSIS — Z992 Dependence on renal dialysis: Secondary | ICD-10-CM | POA: Diagnosis not present

## 2016-11-26 DIAGNOSIS — N2581 Secondary hyperparathyroidism of renal origin: Secondary | ICD-10-CM | POA: Diagnosis not present

## 2016-11-26 DIAGNOSIS — D509 Iron deficiency anemia, unspecified: Secondary | ICD-10-CM | POA: Diagnosis not present

## 2016-11-26 DIAGNOSIS — D631 Anemia in chronic kidney disease: Secondary | ICD-10-CM | POA: Diagnosis not present

## 2016-11-26 DIAGNOSIS — E1129 Type 2 diabetes mellitus with other diabetic kidney complication: Secondary | ICD-10-CM | POA: Diagnosis not present

## 2016-11-26 DIAGNOSIS — N186 End stage renal disease: Secondary | ICD-10-CM | POA: Diagnosis not present

## 2016-11-26 DIAGNOSIS — L732 Hidradenitis suppurativa: Secondary | ICD-10-CM | POA: Diagnosis not present

## 2016-11-29 DIAGNOSIS — D631 Anemia in chronic kidney disease: Secondary | ICD-10-CM | POA: Diagnosis not present

## 2016-11-29 DIAGNOSIS — E1129 Type 2 diabetes mellitus with other diabetic kidney complication: Secondary | ICD-10-CM | POA: Diagnosis not present

## 2016-11-29 DIAGNOSIS — D509 Iron deficiency anemia, unspecified: Secondary | ICD-10-CM | POA: Diagnosis not present

## 2016-11-29 DIAGNOSIS — N186 End stage renal disease: Secondary | ICD-10-CM | POA: Diagnosis not present

## 2016-11-29 DIAGNOSIS — N2581 Secondary hyperparathyroidism of renal origin: Secondary | ICD-10-CM | POA: Diagnosis not present

## 2016-11-30 ENCOUNTER — Other Ambulatory Visit: Payer: BC Managed Care – PPO

## 2016-11-30 ENCOUNTER — Other Ambulatory Visit: Payer: Self-pay | Admitting: Infectious Disease

## 2016-11-30 DIAGNOSIS — L732 Hidradenitis suppurativa: Secondary | ICD-10-CM

## 2016-11-30 LAB — CBC WITH DIFFERENTIAL/PLATELET
BASOS ABS: 0 {cells}/uL (ref 0–200)
Basophils Relative: 0 %
EOS ABS: 695 {cells}/uL — AB (ref 15–500)
EOS PCT: 5 %
HCT: 34.2 % — ABNORMAL LOW (ref 35.0–45.0)
Hemoglobin: 10.4 g/dL — ABNORMAL LOW (ref 11.7–15.5)
LYMPHS ABS: 1251 {cells}/uL (ref 850–3900)
Lymphocytes Relative: 9 %
MCH: 28.7 pg (ref 27.0–33.0)
MCHC: 30.4 g/dL — AB (ref 32.0–36.0)
MCV: 94.5 fL (ref 80.0–100.0)
MPV: 8.9 fL (ref 7.5–12.5)
Monocytes Absolute: 695 cells/uL (ref 200–950)
Monocytes Relative: 5 %
NEUTROS PCT: 81 %
Neutro Abs: 11259 cells/uL — ABNORMAL HIGH (ref 1500–7800)
Platelets: 343 10*3/uL (ref 140–400)
RBC: 3.62 MIL/uL — ABNORMAL LOW (ref 3.80–5.10)
RDW: 17.2 % — ABNORMAL HIGH (ref 11.0–15.0)
WBC: 13.9 10*3/uL — ABNORMAL HIGH (ref 3.8–10.8)

## 2016-12-01 DIAGNOSIS — D509 Iron deficiency anemia, unspecified: Secondary | ICD-10-CM | POA: Diagnosis not present

## 2016-12-01 DIAGNOSIS — E1129 Type 2 diabetes mellitus with other diabetic kidney complication: Secondary | ICD-10-CM | POA: Diagnosis not present

## 2016-12-01 DIAGNOSIS — N2581 Secondary hyperparathyroidism of renal origin: Secondary | ICD-10-CM | POA: Diagnosis not present

## 2016-12-01 DIAGNOSIS — D631 Anemia in chronic kidney disease: Secondary | ICD-10-CM | POA: Diagnosis not present

## 2016-12-01 DIAGNOSIS — N186 End stage renal disease: Secondary | ICD-10-CM | POA: Diagnosis not present

## 2016-12-04 DIAGNOSIS — D509 Iron deficiency anemia, unspecified: Secondary | ICD-10-CM | POA: Diagnosis not present

## 2016-12-04 DIAGNOSIS — E1129 Type 2 diabetes mellitus with other diabetic kidney complication: Secondary | ICD-10-CM | POA: Diagnosis not present

## 2016-12-04 DIAGNOSIS — N2581 Secondary hyperparathyroidism of renal origin: Secondary | ICD-10-CM | POA: Diagnosis not present

## 2016-12-04 DIAGNOSIS — D631 Anemia in chronic kidney disease: Secondary | ICD-10-CM | POA: Diagnosis not present

## 2016-12-04 DIAGNOSIS — N186 End stage renal disease: Secondary | ICD-10-CM | POA: Diagnosis not present

## 2016-12-05 DIAGNOSIS — H903 Sensorineural hearing loss, bilateral: Secondary | ICD-10-CM | POA: Diagnosis not present

## 2016-12-05 DIAGNOSIS — H6123 Impacted cerumen, bilateral: Secondary | ICD-10-CM | POA: Diagnosis not present

## 2016-12-06 DIAGNOSIS — N2581 Secondary hyperparathyroidism of renal origin: Secondary | ICD-10-CM | POA: Diagnosis not present

## 2016-12-06 DIAGNOSIS — E1129 Type 2 diabetes mellitus with other diabetic kidney complication: Secondary | ICD-10-CM | POA: Diagnosis not present

## 2016-12-06 DIAGNOSIS — D631 Anemia in chronic kidney disease: Secondary | ICD-10-CM | POA: Diagnosis not present

## 2016-12-06 DIAGNOSIS — N186 End stage renal disease: Secondary | ICD-10-CM | POA: Diagnosis not present

## 2016-12-06 DIAGNOSIS — D509 Iron deficiency anemia, unspecified: Secondary | ICD-10-CM | POA: Diagnosis not present

## 2016-12-07 ENCOUNTER — Telehealth (HOSPITAL_COMMUNITY): Payer: Self-pay | Admitting: *Deleted

## 2016-12-07 NOTE — Telephone Encounter (Signed)
Patient given detailed instructions per Myocardial Perfusion Study Information Sheet for the test on 12/11/16. Patient notified to arrive 15 minutes early and that it is imperative to arrive on time for appointment to keep from having the test rescheduled.  If you need to cancel or reschedule your appointment, please call the office within 24 hours of your appointment. Failure to do so may result in a cancellation of your appointment, and a $50 no show fee. Patient verbalized understanding. Kirstie Peri

## 2016-12-08 DIAGNOSIS — E1129 Type 2 diabetes mellitus with other diabetic kidney complication: Secondary | ICD-10-CM | POA: Diagnosis not present

## 2016-12-08 DIAGNOSIS — D631 Anemia in chronic kidney disease: Secondary | ICD-10-CM | POA: Diagnosis not present

## 2016-12-08 DIAGNOSIS — N186 End stage renal disease: Secondary | ICD-10-CM | POA: Diagnosis not present

## 2016-12-08 DIAGNOSIS — D509 Iron deficiency anemia, unspecified: Secondary | ICD-10-CM | POA: Diagnosis not present

## 2016-12-08 DIAGNOSIS — N2581 Secondary hyperparathyroidism of renal origin: Secondary | ICD-10-CM | POA: Diagnosis not present

## 2016-12-11 DIAGNOSIS — N2581 Secondary hyperparathyroidism of renal origin: Secondary | ICD-10-CM | POA: Diagnosis not present

## 2016-12-11 DIAGNOSIS — D509 Iron deficiency anemia, unspecified: Secondary | ICD-10-CM | POA: Diagnosis not present

## 2016-12-11 DIAGNOSIS — E1129 Type 2 diabetes mellitus with other diabetic kidney complication: Secondary | ICD-10-CM | POA: Diagnosis not present

## 2016-12-11 DIAGNOSIS — N186 End stage renal disease: Secondary | ICD-10-CM | POA: Diagnosis not present

## 2016-12-11 DIAGNOSIS — D631 Anemia in chronic kidney disease: Secondary | ICD-10-CM | POA: Diagnosis not present

## 2016-12-12 ENCOUNTER — Encounter (HOSPITAL_COMMUNITY): Payer: Self-pay

## 2016-12-12 ENCOUNTER — Telehealth: Payer: Self-pay | Admitting: *Deleted

## 2016-12-12 ENCOUNTER — Ambulatory Visit (HOSPITAL_COMMUNITY): Payer: Medicare Other

## 2016-12-12 ENCOUNTER — Other Ambulatory Visit: Payer: Self-pay | Admitting: *Deleted

## 2016-12-12 MED ORDER — TECHNETIUM TC 99M TETROFOSMIN IV KIT
33.0000 | PACK | Freq: Once | INTRAVENOUS | Status: AC | PRN
  Administered 2016-12-12: 33 via INTRAVENOUS
  Filled 2016-12-12: qty 33

## 2016-12-12 MED ORDER — DOXYCYCLINE HYCLATE 100 MG PO TABS: 100.0000 mg | ORAL_TABLET | Freq: Two times a day (BID) | ORAL | 2 refills | Status: DC

## 2016-12-12 NOTE — Telephone Encounter (Signed)
Rx sent and mom notified. [name]

## 2016-12-12 NOTE — Telephone Encounter (Signed)
I would have her simply stop her septra then

## 2016-12-12 NOTE — Telephone Encounter (Signed)
Yes thanks [name] that is a good idea lets give her 100mg  bid x 30 with 2 refills

## 2016-12-12 NOTE — Telephone Encounter (Signed)
Patient's mother called stating she is having a side effect from the Septra with ringing in the ears and hearing music. Please advise

## 2016-12-12 NOTE — Telephone Encounter (Signed)
Your note has mentioned doxy 100 mg twice daily if she can not do the Bactrim. Do you want her to try this? Myrtis Hopping

## 2016-12-13 DIAGNOSIS — D631 Anemia in chronic kidney disease: Secondary | ICD-10-CM | POA: Diagnosis not present

## 2016-12-13 DIAGNOSIS — D509 Iron deficiency anemia, unspecified: Secondary | ICD-10-CM | POA: Diagnosis not present

## 2016-12-13 DIAGNOSIS — E1129 Type 2 diabetes mellitus with other diabetic kidney complication: Secondary | ICD-10-CM | POA: Diagnosis not present

## 2016-12-13 DIAGNOSIS — N186 End stage renal disease: Secondary | ICD-10-CM | POA: Diagnosis not present

## 2016-12-13 DIAGNOSIS — N2581 Secondary hyperparathyroidism of renal origin: Secondary | ICD-10-CM | POA: Diagnosis not present

## 2016-12-14 ENCOUNTER — Ambulatory Visit (HOSPITAL_COMMUNITY): Payer: Medicare Other

## 2016-12-15 ENCOUNTER — Telehealth: Payer: Self-pay

## 2016-12-15 ENCOUNTER — Other Ambulatory Visit: Payer: Self-pay | Admitting: *Deleted

## 2016-12-15 DIAGNOSIS — I5032 Chronic diastolic (congestive) heart failure: Secondary | ICD-10-CM

## 2016-12-15 DIAGNOSIS — D509 Iron deficiency anemia, unspecified: Secondary | ICD-10-CM | POA: Diagnosis not present

## 2016-12-15 DIAGNOSIS — G4733 Obstructive sleep apnea (adult) (pediatric): Secondary | ICD-10-CM

## 2016-12-15 DIAGNOSIS — D631 Anemia in chronic kidney disease: Secondary | ICD-10-CM | POA: Diagnosis not present

## 2016-12-15 DIAGNOSIS — N2581 Secondary hyperparathyroidism of renal origin: Secondary | ICD-10-CM | POA: Diagnosis not present

## 2016-12-15 DIAGNOSIS — N186 End stage renal disease: Secondary | ICD-10-CM | POA: Diagnosis not present

## 2016-12-15 DIAGNOSIS — E1129 Type 2 diabetes mellitus with other diabetic kidney complication: Secondary | ICD-10-CM | POA: Diagnosis not present

## 2016-12-15 NOTE — Telephone Encounter (Signed)
Patient will need sleep study scheduled and pulmonary OV.  Left message to call back.

## 2016-12-15 NOTE — Telephone Encounter (Deleted)
-----   Message from Sueanne Margarita, MD sent at 12/12/2016  8:09 PM EST ----- Regarding: FW: cancelled nuclear stress test See below ----- Message ----- From: Sueanne Margarita, MD Sent: 12/12/2016   8:06 PM To: [name] Subject: RE: cancelled nuclear stress test              Please make sure she has appt with Pulmonary .  Cannot do imaging study for heart due to inability to lay flat.    Shelley Him, MD ----- Message ----- From: [name] Sent: 12/12/2016   2:15 PM To: Sueanne Margarita, MD Subject: cancelled nuclear stress test                  Per our  discussion, we cancelled the nuclear study on [name].  After multiple attempts, unfortunately she was still unable to lie flat enough for imaging due to extreme SOB (even on 4L O2).

## 2016-12-15 NOTE — Telephone Encounter (Signed)
-----   Message from Sueanne Margarita, MD sent at 12/12/2016  8:07 PM EST ----- Regarding: RE: cancelled nuclear stress test Please set patient up for a split night sleep study  Fransico Him, MD ----- Message ----- From: Ines Bloomer Sent: 12/12/2016   2:15 PM To: Sueanne Margarita, MD Subject: cancelled nuclear stress test                  Per our  discussion, we cancelled the nuclear study on [name].  After multiple attempts, unfortunately she was still unable to lie flat enough for imaging due to extreme SOB (even on 4L O2).

## 2016-12-15 NOTE — Telephone Encounter (Signed)
-----   Message from Sueanne Margarita, MD sent at 12/12/2016  8:09 PM EST ----- Regarding: FW: cancelled nuclear stress test See below ----- Message ----- From: Sueanne Margarita, MD Sent: 12/12/2016   8:06 PM To: [name] Subject: RE: cancelled nuclear stress test              Please make sure she has appt with Pulmonary .  Cannot do imaging study for heart due to inability to lay flat.    Fransico Him, MD ----- Message ----- From: [name] Sent: 12/12/2016   2:15 PM To: Sueanne Margarita, MD Subject: cancelled nuclear stress test                  Per our  discussion, we cancelled the nuclear study on [name].  After multiple attempts, unfortunately she was still unable to lie flat enough for imaging due to extreme SOB (even on 4L O2).

## 2016-12-16 DIAGNOSIS — E119 Type 2 diabetes mellitus without complications: Secondary | ICD-10-CM | POA: Diagnosis not present

## 2016-12-18 DIAGNOSIS — N2581 Secondary hyperparathyroidism of renal origin: Secondary | ICD-10-CM | POA: Diagnosis not present

## 2016-12-18 DIAGNOSIS — D631 Anemia in chronic kidney disease: Secondary | ICD-10-CM | POA: Diagnosis not present

## 2016-12-18 DIAGNOSIS — D509 Iron deficiency anemia, unspecified: Secondary | ICD-10-CM | POA: Diagnosis not present

## 2016-12-18 DIAGNOSIS — N186 End stage renal disease: Secondary | ICD-10-CM | POA: Diagnosis not present

## 2016-12-18 DIAGNOSIS — E1129 Type 2 diabetes mellitus with other diabetic kidney complication: Secondary | ICD-10-CM | POA: Diagnosis not present

## 2016-12-19 ENCOUNTER — Other Ambulatory Visit: Payer: Self-pay | Admitting: Family Medicine

## 2016-12-19 DIAGNOSIS — Z1231 Encounter for screening mammogram for malignant neoplasm of breast: Secondary | ICD-10-CM

## 2016-12-19 NOTE — Telephone Encounter (Signed)
Follow Up:; ° ° °Returning your call. °

## 2016-12-19 NOTE — Telephone Encounter (Signed)
Spoke with patient's mother (DPR).  She reports they saw Pulmonary in December (note in EPIC) and she is unsure whether they will agree to sleep study. She reports they will have issues getting a sleep study in a lab because she has to hidradenitis suppurativa and has to change her dressings. She also has dialysis MWF.  Informed her that the patient could still change her dressing at night before bed and when she wakes up in the morning per usual. She is concerned for transportation and does not know if the patient's husband will be able to take her and change her night time dressings. She will talk to the patient's husband and see if he is willing to help before committing to appointment.   The patient's mother is concerned because the patient is short of breath during the day, not the night. She is having to wear her oxygen more and more for regular activities.  To Dr. Radford Pax to review Pulmonary note and for recommendations.

## 2016-12-20 DIAGNOSIS — N186 End stage renal disease: Secondary | ICD-10-CM | POA: Diagnosis not present

## 2016-12-20 DIAGNOSIS — E1129 Type 2 diabetes mellitus with other diabetic kidney complication: Secondary | ICD-10-CM | POA: Diagnosis not present

## 2016-12-20 DIAGNOSIS — D509 Iron deficiency anemia, unspecified: Secondary | ICD-10-CM | POA: Diagnosis not present

## 2016-12-20 DIAGNOSIS — N2581 Secondary hyperparathyroidism of renal origin: Secondary | ICD-10-CM | POA: Diagnosis not present

## 2016-12-20 DIAGNOSIS — D631 Anemia in chronic kidney disease: Secondary | ICD-10-CM | POA: Diagnosis not present

## 2016-12-20 NOTE — Telephone Encounter (Signed)
Patient's mom reports they will proceed with sleep study. She will bring her to the lab at 2000 on Monday, March 5 and her husband will come after work to help with dressing changes. She understands they will get a packet in the mail regarding sleep study information. BNP scheduled 2/1 per [facility] request. DPR agrees with treatment plan.

## 2016-12-20 NOTE — Telephone Encounter (Signed)
Please have her come in for BNP and recommend proceeding with sleep study.

## 2016-12-20 NOTE — Telephone Encounter (Signed)
Follow up     Returning your call from 12/19/16

## 2016-12-22 DIAGNOSIS — E1129 Type 2 diabetes mellitus with other diabetic kidney complication: Secondary | ICD-10-CM | POA: Diagnosis not present

## 2016-12-22 DIAGNOSIS — D509 Iron deficiency anemia, unspecified: Secondary | ICD-10-CM | POA: Diagnosis not present

## 2016-12-22 DIAGNOSIS — N186 End stage renal disease: Secondary | ICD-10-CM | POA: Diagnosis not present

## 2016-12-22 DIAGNOSIS — N2581 Secondary hyperparathyroidism of renal origin: Secondary | ICD-10-CM | POA: Diagnosis not present

## 2016-12-22 DIAGNOSIS — D631 Anemia in chronic kidney disease: Secondary | ICD-10-CM | POA: Diagnosis not present

## 2016-12-25 ENCOUNTER — Telehealth: Payer: Self-pay

## 2016-12-25 DIAGNOSIS — D631 Anemia in chronic kidney disease: Secondary | ICD-10-CM | POA: Diagnosis not present

## 2016-12-25 DIAGNOSIS — N2581 Secondary hyperparathyroidism of renal origin: Secondary | ICD-10-CM | POA: Diagnosis not present

## 2016-12-25 DIAGNOSIS — D509 Iron deficiency anemia, unspecified: Secondary | ICD-10-CM | POA: Diagnosis not present

## 2016-12-25 DIAGNOSIS — N186 End stage renal disease: Secondary | ICD-10-CM | POA: Diagnosis not present

## 2016-12-25 DIAGNOSIS — E1129 Type 2 diabetes mellitus with other diabetic kidney complication: Secondary | ICD-10-CM | POA: Diagnosis not present

## 2016-12-25 NOTE — Telephone Encounter (Deleted)
[name] Faythe Casa) called in stating that her rash and infection seem to be getting worse and wants to know if her doxycyline can be switched back to vancomycin or another antibiotic. Mother stated that she believes that the infection is getting worse and there's a new onset of puss filled sores around her buttock area and the Pt is unable to sit down due to the pain. Left call back number [phone]

## 2016-12-25 NOTE — Telephone Encounter (Signed)
[name] Faythe Casa) called in stating that her rash and infection seem to be getting worse and wants to know if her doxycyline can be switched back to vancomycin or another antibiotic. Mother stated that she believes that the infection is getting worse and there's a new onset of puss filled sores around her buttock area and the Pt is unable to sit down due to the pain. Left call back number [phone]

## 2016-12-26 ENCOUNTER — Ambulatory Visit (INDEPENDENT_AMBULATORY_CARE_PROVIDER_SITE_OTHER): Payer: Medicare Other | Admitting: Infectious Disease

## 2016-12-26 ENCOUNTER — Encounter: Payer: Self-pay | Admitting: Infectious Disease

## 2016-12-26 VITALS — BP 137/82 | HR 94 | Temp 98.7°F | Wt 341.0 lb

## 2016-12-26 DIAGNOSIS — Z992 Dependence on renal dialysis: Secondary | ICD-10-CM

## 2016-12-26 DIAGNOSIS — L732 Hidradenitis suppurativa: Secondary | ICD-10-CM

## 2016-12-26 DIAGNOSIS — N186 End stage renal disease: Secondary | ICD-10-CM | POA: Diagnosis not present

## 2016-12-26 DIAGNOSIS — H9313 Tinnitus, bilateral: Secondary | ICD-10-CM

## 2016-12-26 DIAGNOSIS — H9319 Tinnitus, unspecified ear: Secondary | ICD-10-CM

## 2016-12-26 HISTORY — DX: Tinnitus, unspecified ear: H93.19

## 2016-12-26 NOTE — Progress Notes (Signed)
Subjective:   Chief complaint : Worsening drainage from lesions in her buttocks   Patient ID: [name], female    DOB: 1961-01-18, 56 y.o.   MRN: [id number]  HPI  [name] is a 56 year old lady with morbid obesity hidradenitis with chronic kidney disease on hemodialysis who developed streptococcal bacteremia when she was an inpatient and whom we treated with vancomycin for streptococcal bacteremia along with ceftaz edema and oral Flagyl to cover her at [facility] which was flaring. After she completed the IV antibiotics for hidradenitis flared again and her antibiotics were extended by Dr. Megan Salon after he saw her in clinic.  When I last saw her we changed her over to oral Bactrim with the hopes that this would provide some further coverage for organisms involved with her infection of her hidradenitis lesions. Unfortunate she developed a sensation of hearing music in both of her ears constantly even when no music was playing. This was prominent while she was taking her Bactrim but has subsided since she has stopped that and change over to doxycycline but is not completely disappeared. She has seen her audiologist but apparently not discuss this with them. It is not clear to me that her hearing has been tested while she's had this symptom.  We'll change her over to doxycycline but since then she has experienced worsening drainage from 3 areas in particular to on her left buttocks. They're quite painful and cause her to feel uncomfortable sitting.    Past Medical History:  Diagnosis Date  . Chronic diastolic CHF (congestive heart failure) ([place])   . Chronic kidney disease (CKD), stage III (moderate)    [name] nephrology lov note 05-12-2013 on pt chart now on HD  . DCM (dilated cardiomyopathy) ([place])    nonischemic - EF now 50-55% by echo 2014  . Depression   . DM type 2 (diabetes mellitus, type 2) ([facility])    diet controlledwith retinopathy and nephropathy  . GERD (gastroesophageal reflux disease)    . H/O cardiac arrest 10/2012  . Hard of hearing 10/2012   now wears 2 hearing aids  . History of hemodialysis dec 2013  . HTN (hypertension)   . Hydradenitis   . Hyperkalemia   . Hyperlipidemia   . Hypothyroidism   . Iron deficiency anemia   . Morbid obesity ([place])   . Multinodular goiter   . Pneumonia Nov 10, 2012  . Sleep apnea    no test yet per mother 10/22/13   . Urinary tract infection    taking antibiotics for 3 days prior to surgery    Past Surgical History:  Procedure Laterality Date  . AV FISTULA PLACEMENT Left 07/25/2013   Procedure: ARTERIOVENOUS (AV) FISTULA CREATION ;  Surgeon: Rosetta Posner, MD;  Location: [place];  Service: Vascular;  Laterality: Left;  . CYSTOSCOPY W/ RETROGRADES  11/16/2012   Procedure: CYSTOSCOPY WITH RETROGRADE PYELOGRAM;  Surgeon: Reece Packer, MD;  Location: [facility];  Service: Urology;  Laterality: Bilateral;  CYSTOSCOPY,BILATERAL RETROGRADE PYELOGRAM/ REMOVAL LEFT URETERAL STENT/ FULGERATION BLADDER MUCOSA/ INSERTION RIGHT URETERAL STENT  . CYSTOSCOPY W/ URETERAL STENT PLACEMENT  05/28/2012   Procedure: CYSTOSCOPY WITH RETROGRADE PYELOGRAM/URETERAL STENT PLACEMENT;  Surgeon: Reece Packer, MD;  Location: [facility];  Service: Urology;  Laterality: Left;  . CYSTOSCOPY WITH RETROGRADE PYELOGRAM, URETEROSCOPY AND STENT PLACEMENT Right 06/23/2013   Procedure: CYSTOSCOPY WITH RIGHT URETEROSCOPY, RIGHT  RETROGRADE PYELOGRAM, WITH LASER LIPOTRIPSY AND RIGHT URETERAL STENT EXCHANGE ;  Surgeon: Molli Hazard, MD;  Location: [facility];  Service: Urology;  Laterality: Right;  STENT EXCHANGE    . CYSTOSCOPY WITH RETROGRADE PYELOGRAM, URETEROSCOPY AND STENT PLACEMENT Right 11/06/2013   Procedure: CYSTOSCOPY WITH RETROGRADE PYELOGRAM, URETEROSCOPY STONE REMOVAL AND STENT REMOVAL;  Surgeon: Molli Hazard, MD;  Location: [facility];  Service: Urology;  Laterality: Right;  . HOLMIUM LASER APPLICATION N/A 6/38/4665   Procedure: HOLMIUM LASER  APPLICATION;  Surgeon: Molli Hazard, MD;  Location: [facility];  Service: Urology;  Laterality: N/A;  . INSERTION OF DIALYSIS CATHETER N/A 09/24/2013   Procedure: INSERTION OF DIALYSIS CATHETER;  Surgeon: Rosetta Posner, MD;  Location: [place];  Service: Vascular;  Laterality: N/A;  . PORT-A-CATH REMOVAL    . PORTACATH PLACEMENT    . REVISON OF ARTERIOVENOUS FISTULA Left 07/25/2016   Procedure: REVISON OF LEFT ARTERIOVENOUS FISTULA;  Surgeon: Elam Dutch, MD;  Location[facility];  Service: Vascular;  Laterality: Left;    Family History  Problem Relation Age of Onset  . Coronary artery disease Mother   . Hypertension Mother   . Diabetes type II Mother   . Coronary artery disease Father   . Hypertension Father   . Malignant hyperthermia Father   . Cancer Maternal Grandfather     ? Type  . Kidney failure Maternal Grandmother       Social History   Social History  . Marital status: Married    Spouse name: N/A  . Number of children: N/A  . Years of education: N/A   Social History Main Topics  . Smoking status: Former Smoker    Packs/day: 0.25    Years: 1.00    Types: Cigarettes    Quit date: 11/28/1979  . Smokeless tobacco: Never Used  . Alcohol use No  . Drug use: No  . Sexual activity: No   Other Topics Concern  . None   Social History Narrative  . None    Allergies  Allergen Reactions  . Amoxicillin Rash and Other (See Comments)    Tolerated Zosyn 01/2013. [place]  . Amoxicillin-Pot Clavulanate Diarrhea     Current Outpatient Prescriptions:  .  aspirin EC 81 MG tablet, Take 81 mg by mouth daily., Disp: , Rfl:  .  Azelastine-Fluticasone 137-50 MCG/ACT SUSP, Place 2 sprays into the nose daily., Disp: 1 Bottle, Rfl: 3 .  Calcium Acetate 667 MG TABS, Take 2,001 mg by mouth 2 (two) times daily with a meal. , Disp: , Rfl:  .  clindamycin (CLEOCIN T) 1 % external solution, , Disp: , Rfl:  .  doxycycline (VIBRA-TABS) 100 MG tablet, Take 1 tablet (100 mg total)  by mouth 2 (two) times daily., Disp: 60 tablet, Rfl: 2 .  DULoxetine (CYMBALTA) 60 MG capsule, Take 1 capsule (60 mg total) by mouth daily., Disp: 30 capsule, Rfl: 0 .  famotidine (PEPCID) 20 MG tablet, TAKE 1 TABLET BY MOUTH AT BEDTIME (Patient taking differently: Take 20 mg by mouth at bedtime), Disp: 30 tablet, Rfl: 0 .  feeding supplement, RESOURCE BREEZE, (RESOURCE BREEZE) LIQD, Take 1 Container by mouth 3 (three) times daily with meals., Disp: , Rfl:  .  glipiZIDE (GLUCOTROL) 5 MG tablet, Take 0.5 tablets (2.5 mg total) by mouth daily., Disp: 30 tablet, Rfl: 0 .  levothyroxine (SYNTHROID, LEVOTHROID) 75 MCG tablet, Take 75 mcg by mouth daily., Disp: , Rfl: 0 .  metroNIDAZOLE (FLAGYL) 500 MG tablet, Take 500 mg by mouth 3 (three) times daily., Disp: , Rfl:  .  midodrine (  PROAMATINE) 10 MG tablet, Take 1 tablet (10 mg total) by mouth 3 (three) times daily. (Patient taking differently: Take 10 mg by mouth daily. ), Disp: 90 tablet, Rfl: 0 .  Multiple Vitamin (MULTIVITAMIN) tablet, Take 1 tablet by mouth daily., Disp: , Rfl:  .  multivitamin (RENA-VIT) TABS tablet, Take 1 tablet by mouth daily., Disp: , Rfl: 6 .  oxyCODONE-acetaminophen (PERCOCET/ROXICET) 5-325 MG tablet, Take 1-2 tablets by mouth every 6 (six) hours as needed. Patient has not taken since started dialysis 08/2013 due to causes hypotension (Patient taking differently: Take 1-2 tablets by mouth every 6 (six) hours as needed for moderate pain. ), Disp: 6 tablet, Rfl: 0 .  pravastatin (PRAVACHOL) 40 MG tablet, Take 40 mg by mouth daily. Patient takes in am, Disp: , Rfl:  .  Probiotic Product (PROBIOTIC DAILY PO), Take 1 tablet by mouth daily. , Disp: , Rfl:  .  sucroferric oxyhydroxide (VELPHORO) 500 MG chewable tablet, Chew 500 mg by mouth 3 (three) times daily with meals., Disp: , Rfl:  .  sulfamethoxazole-trimethoprim (BACTRIM DS,SEPTRA DS) 800-160 MG tablet, Take 2 tablets by mouth 2 (two) times daily. (Patient not taking: Reported  on 12/26/2016), Disp: 120 tablet, Rfl: 2 .  VANCOMYCIN HCL IV, Inject into the vein as directed., Disp: , Rfl:     Review of Systems  Constitutional: Negative for chills, diaphoresis and fever.  HENT: Negative for congestion, hearing loss, sore throat and tinnitus.   Respiratory: Negative for cough, shortness of breath and wheezing.   Cardiovascular: Negative for chest pain, palpitations and leg swelling.  Gastrointestinal: Negative for abdominal pain, blood in stool, constipation, diarrhea, nausea and vomiting.  Genitourinary: Negative for dysuria, flank pain and hematuria.  Musculoskeletal: Negative for back pain and myalgias.  Skin: Positive for color change, rash and wound.  Neurological: Negative for dizziness, weakness and headaches.  Hematological: Does not bruise/bleed easily.  Psychiatric/Behavioral: Negative for suicidal ideas. The patient is nervous/anxious.        Objective:   Physical Exam  Constitutional: She is oriented to person, place, and time. She appears well-developed and well-nourished. No distress.  HENT:  Head: Normocephalic and atraumatic.  Eyes: Conjunctivae and EOM are normal. No scleral icterus.  Neck: Normal range of motion. Neck supple.  Cardiovascular: Normal rate and regular rhythm.   Pulmonary/Chest: Effort normal. No respiratory distress. She has no wheezes.  Abdominal: She exhibits no distension.  Musculoskeletal: She exhibits edema.  Neurological: She is alert and oriented to person, place, and time. Coordination normal.  Skin: Skin is warm and dry. She is not diaphoretic.  Psychiatric: She has a normal mood and affect. Her behavior is normal. Judgment and thought content normal.    Right groin lesion 11/09/2016:     Buttocks wounds today on 12/26/2016     Left lower buttocks 12/26/16:         Assessment & Plan:   Hidradenitis suppurativa: I obtained cultures from these 3 areas we'll see what grows. Again these types of cultures  or not a which reliable in terms of protecting what organisms might be involved but they may give [place] some indication.  [name] would like to hold off on starting antibiotics we have a  a potential culprit It will be helpful for dermatologist was willing to be involved in her care and use other drugs apparently she has not been offered much beyond topical clindamycin having seen dermatologist at [facility] and she said [facility] although I did not  remember seeing documentation from [facility]  Constant hearing of music: I'm not familiar with this phenomenon but apparently as described in the literature I saw him off and there was being due to Bactrim but I suppose is possible and she has stopped the Bactrim. He should see audiology.  Morbid obesity  ESRD on HD  Streptococcal bacteremia: Resolved  I spent greater than 25 minutes with the patient including greater than 50% of time in face to face counsel of the patient and her mother with regards to her hidradenitis her end-stage renal disease on dialysis for streptococcal bacteremia and in coordination of her care.

## 2016-12-27 DIAGNOSIS — E1129 Type 2 diabetes mellitus with other diabetic kidney complication: Secondary | ICD-10-CM | POA: Diagnosis not present

## 2016-12-27 DIAGNOSIS — N2581 Secondary hyperparathyroidism of renal origin: Secondary | ICD-10-CM | POA: Diagnosis not present

## 2016-12-27 DIAGNOSIS — Z992 Dependence on renal dialysis: Secondary | ICD-10-CM | POA: Diagnosis not present

## 2016-12-27 DIAGNOSIS — N186 End stage renal disease: Secondary | ICD-10-CM | POA: Diagnosis not present

## 2016-12-27 DIAGNOSIS — D631 Anemia in chronic kidney disease: Secondary | ICD-10-CM | POA: Diagnosis not present

## 2016-12-27 DIAGNOSIS — D509 Iron deficiency anemia, unspecified: Secondary | ICD-10-CM | POA: Diagnosis not present

## 2016-12-28 ENCOUNTER — Other Ambulatory Visit: Payer: Medicare Other | Admitting: *Deleted

## 2016-12-28 DIAGNOSIS — I5032 Chronic diastolic (congestive) heart failure: Secondary | ICD-10-CM

## 2016-12-28 LAB — PRO B NATRIURETIC PEPTIDE: NT-Pro BNP: 12642 pg/mL — ABNORMAL HIGH (ref 0–287)

## 2016-12-29 ENCOUNTER — Telehealth: Payer: Self-pay | Admitting: Infectious Disease

## 2016-12-29 ENCOUNTER — Telehealth: Payer: Self-pay | Admitting: *Deleted

## 2016-12-29 DIAGNOSIS — D509 Iron deficiency anemia, unspecified: Secondary | ICD-10-CM | POA: Diagnosis not present

## 2016-12-29 DIAGNOSIS — D631 Anemia in chronic kidney disease: Secondary | ICD-10-CM | POA: Diagnosis not present

## 2016-12-29 DIAGNOSIS — N186 End stage renal disease: Secondary | ICD-10-CM | POA: Diagnosis not present

## 2016-12-29 DIAGNOSIS — N2581 Secondary hyperparathyroidism of renal origin: Secondary | ICD-10-CM | POA: Diagnosis not present

## 2016-12-29 DIAGNOSIS — E1129 Type 2 diabetes mellitus with other diabetic kidney complication: Secondary | ICD-10-CM | POA: Diagnosis not present

## 2016-12-29 LAB — WOUND CULTURE
GRAM STAIN: NONE SEEN
GRAM STAIN: NONE SEEN
Gram Stain: NONE SEEN
Gram Stain: NONE SEEN
Organism ID, Bacteria: NO GROWTH

## 2016-12-29 MED ORDER — LEVOFLOXACIN 500 MG PO TABS
500.0000 mg | ORAL_TABLET | ORAL | 1 refills | Status: DC
Start: 2016-12-29 — End: 2017-02-04

## 2016-12-29 MED ORDER — DOXYCYCLINE HYCLATE 100 MG PO CAPS: 100.0000 mg | ORAL_CAPSULE | Freq: Two times a day (BID) | ORAL | 1 refills | Status: DC

## 2016-12-29 MED ORDER — LEVOFLOXACIN 750 MG PO TABS: 750.0000 mg | ORAL_TABLET | Freq: Once | ORAL | 0 refills | Status: AC

## 2016-12-29 NOTE — Telephone Encounter (Signed)
Relayed instructions, results to patient's mother. She verbalized understanding of instructions, will pick up meds today. Landis Gandy, RN

## 2016-12-29 NOTE — Telephone Encounter (Signed)
-----   Message from Truman Hayward, MD sent at 12/29/2016 11:37 AM EST ----- Nothing growing on cultures so far. I want to give [name] 750mg  levaquin ONCE followed by 500mg  every other day for 21 days

## 2016-12-29 NOTE — Telephone Encounter (Signed)
I sent rx for levaquin to cover GNR and doxy to cover corynebacterium we grew from her wound cultures

## 2017-01-01 DIAGNOSIS — D631 Anemia in chronic kidney disease: Secondary | ICD-10-CM | POA: Diagnosis not present

## 2017-01-01 DIAGNOSIS — N186 End stage renal disease: Secondary | ICD-10-CM | POA: Diagnosis not present

## 2017-01-01 DIAGNOSIS — D509 Iron deficiency anemia, unspecified: Secondary | ICD-10-CM | POA: Diagnosis not present

## 2017-01-01 DIAGNOSIS — N2581 Secondary hyperparathyroidism of renal origin: Secondary | ICD-10-CM | POA: Diagnosis not present

## 2017-01-01 DIAGNOSIS — E1129 Type 2 diabetes mellitus with other diabetic kidney complication: Secondary | ICD-10-CM | POA: Diagnosis not present

## 2017-01-03 DIAGNOSIS — E1129 Type 2 diabetes mellitus with other diabetic kidney complication: Secondary | ICD-10-CM | POA: Diagnosis not present

## 2017-01-03 DIAGNOSIS — D631 Anemia in chronic kidney disease: Secondary | ICD-10-CM | POA: Diagnosis not present

## 2017-01-03 DIAGNOSIS — N186 End stage renal disease: Secondary | ICD-10-CM | POA: Diagnosis not present

## 2017-01-03 DIAGNOSIS — D509 Iron deficiency anemia, unspecified: Secondary | ICD-10-CM | POA: Diagnosis not present

## 2017-01-03 DIAGNOSIS — N2581 Secondary hyperparathyroidism of renal origin: Secondary | ICD-10-CM | POA: Diagnosis not present

## 2017-01-05 DIAGNOSIS — D631 Anemia in chronic kidney disease: Secondary | ICD-10-CM | POA: Diagnosis not present

## 2017-01-05 DIAGNOSIS — E1129 Type 2 diabetes mellitus with other diabetic kidney complication: Secondary | ICD-10-CM | POA: Diagnosis not present

## 2017-01-05 DIAGNOSIS — N2581 Secondary hyperparathyroidism of renal origin: Secondary | ICD-10-CM | POA: Diagnosis not present

## 2017-01-05 DIAGNOSIS — N186 End stage renal disease: Secondary | ICD-10-CM | POA: Diagnosis not present

## 2017-01-05 DIAGNOSIS — D509 Iron deficiency anemia, unspecified: Secondary | ICD-10-CM | POA: Diagnosis not present

## 2017-01-08 DIAGNOSIS — D631 Anemia in chronic kidney disease: Secondary | ICD-10-CM | POA: Diagnosis not present

## 2017-01-08 DIAGNOSIS — D509 Iron deficiency anemia, unspecified: Secondary | ICD-10-CM | POA: Diagnosis not present

## 2017-01-08 DIAGNOSIS — N2581 Secondary hyperparathyroidism of renal origin: Secondary | ICD-10-CM | POA: Diagnosis not present

## 2017-01-08 DIAGNOSIS — E1129 Type 2 diabetes mellitus with other diabetic kidney complication: Secondary | ICD-10-CM | POA: Diagnosis not present

## 2017-01-08 DIAGNOSIS — N186 End stage renal disease: Secondary | ICD-10-CM | POA: Diagnosis not present

## 2017-01-09 ENCOUNTER — Ambulatory Visit
Admission: RE | Admit: 2017-01-09 | Discharge: 2017-01-09 | Disposition: A | Discharge status: Home / Self Care | Payer: BC Managed Care – PPO | Source: Ambulatory Visit | Attending: Family Medicine | Admitting: Family Medicine

## 2017-01-09 DIAGNOSIS — Z1231 Encounter for screening mammogram for malignant neoplasm of breast: Secondary | ICD-10-CM

## 2017-01-10 DIAGNOSIS — N186 End stage renal disease: Secondary | ICD-10-CM | POA: Diagnosis not present

## 2017-01-10 DIAGNOSIS — E1129 Type 2 diabetes mellitus with other diabetic kidney complication: Secondary | ICD-10-CM | POA: Diagnosis not present

## 2017-01-10 DIAGNOSIS — N2581 Secondary hyperparathyroidism of renal origin: Secondary | ICD-10-CM | POA: Diagnosis not present

## 2017-01-10 DIAGNOSIS — D631 Anemia in chronic kidney disease: Secondary | ICD-10-CM | POA: Diagnosis not present

## 2017-01-10 DIAGNOSIS — D509 Iron deficiency anemia, unspecified: Secondary | ICD-10-CM | POA: Diagnosis not present

## 2017-01-12 DIAGNOSIS — D631 Anemia in chronic kidney disease: Secondary | ICD-10-CM | POA: Diagnosis not present

## 2017-01-12 DIAGNOSIS — N186 End stage renal disease: Secondary | ICD-10-CM | POA: Diagnosis not present

## 2017-01-12 DIAGNOSIS — D509 Iron deficiency anemia, unspecified: Secondary | ICD-10-CM | POA: Diagnosis not present

## 2017-01-12 DIAGNOSIS — E1129 Type 2 diabetes mellitus with other diabetic kidney complication: Secondary | ICD-10-CM | POA: Diagnosis not present

## 2017-01-12 DIAGNOSIS — N2581 Secondary hyperparathyroidism of renal origin: Secondary | ICD-10-CM | POA: Diagnosis not present

## 2017-01-15 DIAGNOSIS — D509 Iron deficiency anemia, unspecified: Secondary | ICD-10-CM | POA: Diagnosis not present

## 2017-01-15 DIAGNOSIS — N2581 Secondary hyperparathyroidism of renal origin: Secondary | ICD-10-CM | POA: Diagnosis not present

## 2017-01-15 DIAGNOSIS — D631 Anemia in chronic kidney disease: Secondary | ICD-10-CM | POA: Diagnosis not present

## 2017-01-15 DIAGNOSIS — N186 End stage renal disease: Secondary | ICD-10-CM | POA: Diagnosis not present

## 2017-01-15 DIAGNOSIS — E1129 Type 2 diabetes mellitus with other diabetic kidney complication: Secondary | ICD-10-CM | POA: Diagnosis not present

## 2017-01-17 DIAGNOSIS — N2581 Secondary hyperparathyroidism of renal origin: Secondary | ICD-10-CM | POA: Diagnosis not present

## 2017-01-17 DIAGNOSIS — D509 Iron deficiency anemia, unspecified: Secondary | ICD-10-CM | POA: Diagnosis not present

## 2017-01-17 DIAGNOSIS — E1129 Type 2 diabetes mellitus with other diabetic kidney complication: Secondary | ICD-10-CM | POA: Diagnosis not present

## 2017-01-17 DIAGNOSIS — N186 End stage renal disease: Secondary | ICD-10-CM | POA: Diagnosis not present

## 2017-01-17 DIAGNOSIS — D631 Anemia in chronic kidney disease: Secondary | ICD-10-CM | POA: Diagnosis not present

## 2017-01-19 DIAGNOSIS — N2581 Secondary hyperparathyroidism of renal origin: Secondary | ICD-10-CM | POA: Diagnosis not present

## 2017-01-19 DIAGNOSIS — N186 End stage renal disease: Secondary | ICD-10-CM | POA: Diagnosis not present

## 2017-01-19 DIAGNOSIS — D631 Anemia in chronic kidney disease: Secondary | ICD-10-CM | POA: Diagnosis not present

## 2017-01-19 DIAGNOSIS — D509 Iron deficiency anemia, unspecified: Secondary | ICD-10-CM | POA: Diagnosis not present

## 2017-01-19 DIAGNOSIS — E1129 Type 2 diabetes mellitus with other diabetic kidney complication: Secondary | ICD-10-CM | POA: Diagnosis not present

## 2017-01-22 DIAGNOSIS — N186 End stage renal disease: Secondary | ICD-10-CM | POA: Diagnosis not present

## 2017-01-22 DIAGNOSIS — E1129 Type 2 diabetes mellitus with other diabetic kidney complication: Secondary | ICD-10-CM | POA: Diagnosis not present

## 2017-01-22 DIAGNOSIS — D509 Iron deficiency anemia, unspecified: Secondary | ICD-10-CM | POA: Diagnosis not present

## 2017-01-22 DIAGNOSIS — N2581 Secondary hyperparathyroidism of renal origin: Secondary | ICD-10-CM | POA: Diagnosis not present

## 2017-01-22 DIAGNOSIS — D631 Anemia in chronic kidney disease: Secondary | ICD-10-CM | POA: Diagnosis not present

## 2017-01-24 DIAGNOSIS — D509 Iron deficiency anemia, unspecified: Secondary | ICD-10-CM | POA: Diagnosis not present

## 2017-01-24 DIAGNOSIS — E1129 Type 2 diabetes mellitus with other diabetic kidney complication: Secondary | ICD-10-CM | POA: Diagnosis not present

## 2017-01-24 DIAGNOSIS — Z992 Dependence on renal dialysis: Secondary | ICD-10-CM | POA: Diagnosis not present

## 2017-01-24 DIAGNOSIS — D631 Anemia in chronic kidney disease: Secondary | ICD-10-CM | POA: Diagnosis not present

## 2017-01-24 DIAGNOSIS — N186 End stage renal disease: Secondary | ICD-10-CM | POA: Diagnosis not present

## 2017-01-24 DIAGNOSIS — N2581 Secondary hyperparathyroidism of renal origin: Secondary | ICD-10-CM | POA: Diagnosis not present

## 2017-01-26 DIAGNOSIS — E1129 Type 2 diabetes mellitus with other diabetic kidney complication: Secondary | ICD-10-CM | POA: Diagnosis not present

## 2017-01-26 DIAGNOSIS — N2581 Secondary hyperparathyroidism of renal origin: Secondary | ICD-10-CM | POA: Diagnosis not present

## 2017-01-26 DIAGNOSIS — D509 Iron deficiency anemia, unspecified: Secondary | ICD-10-CM | POA: Diagnosis not present

## 2017-01-26 DIAGNOSIS — L732 Hidradenitis suppurativa: Secondary | ICD-10-CM | POA: Diagnosis not present

## 2017-01-26 DIAGNOSIS — N186 End stage renal disease: Secondary | ICD-10-CM | POA: Diagnosis not present

## 2017-01-26 DIAGNOSIS — D631 Anemia in chronic kidney disease: Secondary | ICD-10-CM | POA: Diagnosis not present

## 2017-01-29 ENCOUNTER — Encounter (HOSPITAL_BASED_OUTPATIENT_CLINIC_OR_DEPARTMENT_OTHER): Payer: Medicare Other

## 2017-01-29 DIAGNOSIS — E1129 Type 2 diabetes mellitus with other diabetic kidney complication: Secondary | ICD-10-CM | POA: Diagnosis not present

## 2017-01-29 DIAGNOSIS — D509 Iron deficiency anemia, unspecified: Secondary | ICD-10-CM | POA: Diagnosis not present

## 2017-01-29 DIAGNOSIS — N2581 Secondary hyperparathyroidism of renal origin: Secondary | ICD-10-CM | POA: Diagnosis not present

## 2017-01-29 DIAGNOSIS — L732 Hidradenitis suppurativa: Secondary | ICD-10-CM | POA: Diagnosis not present

## 2017-01-29 DIAGNOSIS — D631 Anemia in chronic kidney disease: Secondary | ICD-10-CM | POA: Diagnosis not present

## 2017-01-29 DIAGNOSIS — N186 End stage renal disease: Secondary | ICD-10-CM | POA: Diagnosis not present

## 2017-01-30 ENCOUNTER — Ambulatory Visit (INDEPENDENT_AMBULATORY_CARE_PROVIDER_SITE_OTHER): Payer: Medicare Other | Admitting: Adult Health

## 2017-01-30 ENCOUNTER — Encounter: Payer: Self-pay | Admitting: Adult Health

## 2017-01-30 DIAGNOSIS — J9611 Chronic respiratory failure with hypoxia: Secondary | ICD-10-CM

## 2017-01-30 MED ORDER — AZELASTINE-FLUTICASONE 137-50 MCG/ACT NA SUSP: 2.0000 | Freq: Every day | NASAL | 3 refills | Status: DC

## 2017-01-30 NOTE — Progress Notes (Signed)
@Patient  ID: [name], female    DOB: Jun 29, 1961, 56 y.o.   MRN: [id number]  Chief Complaint  Patient presents with  . Follow-up    dyspnea     Referring provider: Harlan Stains, MD  HPI: 56 year old female seen for initial pulmonary consult for dyspnea in 2015. Patient has a history of end-stage renal disease on dialysis. Chronic diastolic heart failure.  TEST  CXR 08/25/16- Mild vascular congestion. Bibasilar atelectasis.  CXR 10/03/16-mild  interstitial prominence, bronchitis Images reviewed.  Echo 08/31/16-   Left ventricle: The cavity size was normal. Wall thickness was increased in a pattern of mild LVH. Systolic function was normal. The estimated ejection fraction was in the range of 60% to 65%. Wall motion was normal; there were no regional wall motion abnormalities. Left ventricular diastolic function parameters were normal.  PFTs 11/07/16 FVC 1.77 (57%) FEV1 1.62 (66%) F/F 91 TLC 52% RV/TLC 112% DLCO 44% Moderate restriction with diffusion impairment. Reduced diffusion capacity corrects for alveolar volume.   01/30/2017 Follow up : O2 RF  Patient returns for a three-month follow-up. Patient is on oxygen 2 L with activity and at bedtime. Her insurance company is requiring a oxygen qualification test today. Walk test in office shows desaturations :  Room Air at Rest = 94% Patient Saturations on Room Air while Ambulating = 85% Patient Saturations on 2 Liters of oxygen while Ambulating = 94%  Patient has chronic dyspnea on exertion that is been felt to be secondary to obesity and deconditioning. Previous pulmonary function test showed restriction with diffusing defect. It was not felt that she had any interstitial lung disease on chest x-ray.    Allergies  Allergen Reactions  . Amoxicillin Rash and Other (See Comments)    Tolerated Zosyn 01/2013. [place]  . Amoxicillin-Pot Clavulanate Diarrhea    Immunization History  Administered Date(s)  Administered  . Influenza Split 08/26/2016  . Influenza,inj,Quad PF,36+ Mos 08/14/2013    Past Medical History:  Diagnosis Date  . Chronic diastolic CHF (congestive heart failure) ([place])   . Chronic kidney disease (CKD), stage III (moderate)    [name] nephrology lov note 05-12-2013 on pt chart now on HD  . DCM (dilated cardiomyopathy) ()    nonischemic - EF now 50-55% by echo 2014  . Depression   . DM type 2 (diabetes mellitus, type 2) ([facility])    diet controlledwith retinopathy and nephropathy  . GERD (gastroesophageal reflux disease)   . H/O cardiac arrest 10/2012  . Hard of hearing 10/2012   now wears 2 hearing aids  . History of hemodialysis dec 2013  . HTN (hypertension)   . Hydradenitis   . Hyperkalemia   . Hyperlipidemia   . Hypothyroidism   . Iron deficiency anemia   . Morbid obesity ([place])   . Multinodular goiter   . Pneumonia Nov 10, 2012  . Sleep apnea    no test yet per mother 10/22/13   . Tinnitus 12/26/2016  . Urinary tract infection    taking antibiotics for 3 days prior to surgery    Tobacco History: History  Smoking Status  . Former Smoker  . Packs/day: 0.25  . Years: 1.00  . Types: Cigarettes  . Quit date: 11/28/1979  Smokeless Tobacco  . Never Used   Counseling given: Not Answered   Outpatient Encounter Prescriptions as of 01/30/2017  Medication Sig  . aspirin EC 81 MG tablet Take 81 mg by mouth daily.  . Azelastine-Fluticasone 137-50 MCG/ACT SUSP Place 2 sprays  into the nose daily.  . Calcium Acetate 667 MG TABS Take 2,001 mg by mouth 2 (two) times daily with a meal.   . clindamycin (CLEOCIN T) 1 % external solution   . doxycycline (VIBRAMYCIN) 100 MG capsule Take 1 capsule (100 mg total) by mouth 2 (two) times daily.  . DULoxetine (CYMBALTA) 60 MG capsule Take 1 capsule (60 mg total) by mouth daily.  . famotidine (PEPCID) 20 MG tablet TAKE 1 TABLET BY MOUTH AT BEDTIME (Patient taking differently: Take 20 mg by mouth at bedtime)  . feeding  supplement, RESOURCE BREEZE, (RESOURCE BREEZE) LIQD Take 1 Container by mouth 3 (three) times daily with meals.  Marland Kitchen glipiZIDE (GLUCOTROL) 5 MG tablet Take 0.5 tablets (2.5 mg total) by mouth daily.  Marland Kitchen levofloxacin (LEVAQUIN) 500 MG tablet Take 1 tablet (500 mg total) by mouth every other day. 1st dose day after the 750mg  dose  . levothyroxine (SYNTHROID, LEVOTHROID) 75 MCG tablet Take 75 mcg by mouth daily.  . midodrine (PROAMATINE) 10 MG tablet Take 1 tablet (10 mg total) by mouth 3 (three) times daily. (Patient taking differently: Take 10 mg by mouth daily. )  . Multiple Vitamin (MULTIVITAMIN) tablet Take 1 tablet by mouth daily.  . multivitamin (RENA-VIT) TABS tablet Take 1 tablet by mouth daily.  Marland Kitchen oxyCODONE-acetaminophen (PERCOCET/ROXICET) 5-325 MG tablet Take 1-2 tablets by mouth every 6 (six) hours as needed. Patient has not taken since started dialysis 08/2013 due to causes hypotension (Patient taking differently: Take 1-2 tablets by mouth every 6 (six) hours as needed for moderate pain. )  . pravastatin (PRAVACHOL) 40 MG tablet Take 40 mg by mouth daily. Patient takes in am  . Probiotic Product (PROBIOTIC DAILY PO) Take 1 tablet by mouth daily.   . sucroferric oxyhydroxide (VELPHORO) 500 MG chewable tablet Chew 500 mg by mouth 3 (three) times daily with meals.  . [DISCONTINUED] Azelastine-Fluticasone 137-50 MCG/ACT SUSP Place 2 sprays into the nose daily.   No facility-administered encounter medications on file as of 01/30/2017.      Review of Systems  Constitutional:   No  weight loss, night sweats,  Fevers, chills,  +fatigue, or  lassitude.  HEENT:   No headaches,  Difficulty swallowing,  Tooth/dental problems, or  Sore throat,                No sneezing, itching, ear ache, nasal congestion, post nasal drip,   CV:  No chest pain,  Orthopnea, PND, swelling in lower extremities, anasarca, dizziness, palpitations, syncope.   GI  No heartburn, indigestion, abdominal pain, nausea,  vomiting, diarrhea, change in bowel habits, loss of appetite, bloody stools.   Resp:   No chest wall deformity  Skin: no rash or lesions.  GU: no dysuria, change in color of urine, no urgency or frequency.  No flank pain, no hematuria   MS:  No joint pain or swelling.  No decreased range of motion.  No back pain.    Physical Exam  BP 122/74 (BP Location: Left Arm, Cuff Size: Large)   Pulse 85   Wt (!) 341 lb 3.2 oz (154.8 kg)   LMP 06/13/2013   SpO2 95%   BMI 54.25 kg/m   GEN: A/Ox3; pleasant , NAD, obese in wc    HEENT:  [place]/AT,  EACs-clear, TMs-wnl, NOSE-clear, THROAT-clear, no lesions, no postnasal drip or exudate noted.   NECK:  Supple w/ fair ROM; no JVD; normal carotid impulses w/o bruits; no thyromegaly or nodules palpated; no lymphadenopathy.  RESP  Clear  P & A; w/o, wheezes/ rales/ or rhonchi. no accessory muscle use, no dullness to percussion  CARD:  RRR, no m/r/g,tr peripheral edema, pulses intact, no cyanosis or clubbing.  GI:   Soft & nt; nml bowel sounds; no organomegaly or masses detected.   Musco: Warm bil, no deformities or joint swelling noted.   Neuro: alert, no focal deficits noted.    Skin: Warm, no lesions or rashes   Imaging: Mm Digital Screening Bilateral  Result Date: 01/09/2017 CLINICAL DATA:  Screening. History of benign bilateral breast biopsies. EXAM: DIGITAL SCREENING BILATERAL MAMMOGRAM WITH CAD COMPARISON:  Previous exam(s). ACR Breast Density Category b: There are scattered areas of fibroglandular density. FINDINGS: Biopsy site marker within the outer right breast is stable in position. There are no findings suspicious for malignancy within either breast. Images were processed with CAD. IMPRESSION: No mammographic evidence of malignancy. A result letter of this screening mammogram will be mailed directly to the patient. RECOMMENDATION: Screening mammogram in one year. (Code:SM-B-01Y) BI-RADS CATEGORY  2: Benign. Electronically Signed    By: Franki Cabot M.D.   On: 01/09/2017 13:16     Assessment & Plan:   Chronic respiratory failure Cont on O2 at 2l/m with activity and At bedtime  .        Rexene Edison, NP 01/30/2017

## 2017-01-30 NOTE — Assessment & Plan Note (Addendum)
Patient Saturations on Room Air at Rest = 94% Patient Saturations on [employer] while Ambulating = 85% Patient Saturations on 2 Liters of oxygen while Ambulating = 94%  Cont on O2 at 2l/m with activity and At bedtime  .

## 2017-01-30 NOTE — Patient Instructions (Addendum)
Continue on Oxygen 2l/m with activity and At bedtime   Order for POC to DME .  Follow up with Dr. Vaughan Browner in 3 months and As needed

## 2017-01-31 DIAGNOSIS — L732 Hidradenitis suppurativa: Secondary | ICD-10-CM | POA: Diagnosis not present

## 2017-01-31 DIAGNOSIS — D509 Iron deficiency anemia, unspecified: Secondary | ICD-10-CM | POA: Diagnosis not present

## 2017-01-31 DIAGNOSIS — N2581 Secondary hyperparathyroidism of renal origin: Secondary | ICD-10-CM | POA: Diagnosis not present

## 2017-01-31 DIAGNOSIS — N186 End stage renal disease: Secondary | ICD-10-CM | POA: Diagnosis not present

## 2017-01-31 DIAGNOSIS — D631 Anemia in chronic kidney disease: Secondary | ICD-10-CM | POA: Diagnosis not present

## 2017-01-31 DIAGNOSIS — E1129 Type 2 diabetes mellitus with other diabetic kidney complication: Secondary | ICD-10-CM | POA: Diagnosis not present

## 2017-02-01 ENCOUNTER — Ambulatory Visit: Payer: Medicare Other | Admitting: Podiatry

## 2017-02-02 ENCOUNTER — Encounter (HOSPITAL_COMMUNITY): Payer: Self-pay | Admitting: Emergency Medicine

## 2017-02-02 ENCOUNTER — Inpatient Hospital Stay (HOSPITAL_COMMUNITY): Payer: Medicare Other

## 2017-02-02 ENCOUNTER — Inpatient Hospital Stay (HOSPITAL_COMMUNITY)
Admission: EM | Admit: 2017-02-02 | Discharge: 2017-02-04 | DRG: 683 | Disposition: A | Discharge status: Home / Self Care | Payer: Medicare Other | Attending: Family Medicine | Admitting: Family Medicine

## 2017-02-02 ENCOUNTER — Emergency Department (HOSPITAL_COMMUNITY): Payer: Medicare Other

## 2017-02-02 DIAGNOSIS — IMO0002 Reserved for concepts with insufficient information to code with codable children: Secondary | ICD-10-CM

## 2017-02-02 DIAGNOSIS — E1122 Type 2 diabetes mellitus with diabetic chronic kidney disease: Secondary | ICD-10-CM | POA: Diagnosis present

## 2017-02-02 DIAGNOSIS — Z7982 Long term (current) use of aspirin: Secondary | ICD-10-CM | POA: Diagnosis not present

## 2017-02-02 DIAGNOSIS — Z8674 Personal history of sudden cardiac arrest: Secondary | ICD-10-CM | POA: Diagnosis not present

## 2017-02-02 DIAGNOSIS — N185 Chronic kidney disease, stage 5: Secondary | ICD-10-CM | POA: Diagnosis not present

## 2017-02-02 DIAGNOSIS — Z992 Dependence on renal dialysis: Secondary | ICD-10-CM

## 2017-02-02 DIAGNOSIS — Z79899 Other long term (current) drug therapy: Secondary | ICD-10-CM

## 2017-02-02 DIAGNOSIS — L02415 Cutaneous abscess of right lower limb: Secondary | ICD-10-CM | POA: Diagnosis present

## 2017-02-02 DIAGNOSIS — Z841 Family history of disorders of kidney and ureter: Secondary | ICD-10-CM | POA: Diagnosis not present

## 2017-02-02 DIAGNOSIS — R0602 Shortness of breath: Secondary | ICD-10-CM

## 2017-02-02 DIAGNOSIS — I10 Essential (primary) hypertension: Secondary | ICD-10-CM | POA: Diagnosis not present

## 2017-02-02 DIAGNOSIS — Z8249 Family history of ischemic heart disease and other diseases of the circulatory system: Secondary | ICD-10-CM

## 2017-02-02 DIAGNOSIS — G4733 Obstructive sleep apnea (adult) (pediatric): Secondary | ICD-10-CM | POA: Diagnosis present

## 2017-02-02 DIAGNOSIS — I5032 Chronic diastolic (congestive) heart failure: Secondary | ICD-10-CM | POA: Diagnosis not present

## 2017-02-02 DIAGNOSIS — I493 Ventricular premature depolarization: Secondary | ICD-10-CM | POA: Diagnosis present

## 2017-02-02 DIAGNOSIS — I480 Paroxysmal atrial fibrillation: Secondary | ICD-10-CM | POA: Diagnosis present

## 2017-02-02 DIAGNOSIS — Z9981 Dependence on supplemental oxygen: Secondary | ICD-10-CM | POA: Diagnosis not present

## 2017-02-02 DIAGNOSIS — D631 Anemia in chronic kidney disease: Secondary | ICD-10-CM | POA: Diagnosis present

## 2017-02-02 DIAGNOSIS — I9589 Other hypotension: Secondary | ICD-10-CM | POA: Diagnosis present

## 2017-02-02 DIAGNOSIS — N186 End stage renal disease: Secondary | ICD-10-CM | POA: Diagnosis not present

## 2017-02-02 DIAGNOSIS — E119 Type 2 diabetes mellitus without complications: Secondary | ICD-10-CM

## 2017-02-02 DIAGNOSIS — R6510 Systemic inflammatory response syndrome (SIRS) of non-infectious origin without acute organ dysfunction: Secondary | ICD-10-CM | POA: Diagnosis not present

## 2017-02-02 DIAGNOSIS — Z8619 Personal history of other infectious and parasitic diseases: Secondary | ICD-10-CM | POA: Diagnosis not present

## 2017-02-02 DIAGNOSIS — I132 Hypertensive heart and chronic kidney disease with heart failure and with stage 5 chronic kidney disease, or end stage renal disease: Secondary | ICD-10-CM | POA: Diagnosis present

## 2017-02-02 DIAGNOSIS — E11319 Type 2 diabetes mellitus with unspecified diabetic retinopathy without macular edema: Secondary | ICD-10-CM | POA: Diagnosis present

## 2017-02-02 DIAGNOSIS — A419 Sepsis, unspecified organism: Secondary | ICD-10-CM

## 2017-02-02 DIAGNOSIS — N2581 Secondary hyperparathyroidism of renal origin: Secondary | ICD-10-CM | POA: Diagnosis not present

## 2017-02-02 DIAGNOSIS — I4819 Other persistent atrial fibrillation: Secondary | ICD-10-CM | POA: Diagnosis present

## 2017-02-02 DIAGNOSIS — Z888 Allergy status to other drugs, medicaments and biological substances status: Secondary | ICD-10-CM

## 2017-02-02 DIAGNOSIS — J9611 Chronic respiratory failure with hypoxia: Secondary | ICD-10-CM | POA: Diagnosis not present

## 2017-02-02 DIAGNOSIS — Z6841 Body Mass Index (BMI) 40.0 and over, adult: Secondary | ICD-10-CM

## 2017-02-02 DIAGNOSIS — Z833 Family history of diabetes mellitus: Secondary | ICD-10-CM | POA: Diagnosis not present

## 2017-02-02 DIAGNOSIS — L732 Hidradenitis suppurativa: Secondary | ICD-10-CM | POA: Diagnosis not present

## 2017-02-02 DIAGNOSIS — R000 Tachycardia, unspecified: Secondary | ICD-10-CM | POA: Diagnosis present

## 2017-02-02 DIAGNOSIS — Z87891 Personal history of nicotine dependence: Secondary | ICD-10-CM

## 2017-02-02 DIAGNOSIS — Z881 Allergy status to other antibiotic agents status: Secondary | ICD-10-CM | POA: Diagnosis not present

## 2017-02-02 DIAGNOSIS — K219 Gastro-esophageal reflux disease without esophagitis: Secondary | ICD-10-CM | POA: Diagnosis present

## 2017-02-02 DIAGNOSIS — J9610 Chronic respiratory failure, unspecified whether with hypoxia or hypercapnia: Secondary | ICD-10-CM | POA: Diagnosis present

## 2017-02-02 DIAGNOSIS — H9190 Unspecified hearing loss, unspecified ear: Secondary | ICD-10-CM | POA: Diagnosis present

## 2017-02-02 DIAGNOSIS — Z794 Long term (current) use of insulin: Secondary | ICD-10-CM | POA: Diagnosis not present

## 2017-02-02 DIAGNOSIS — R509 Fever, unspecified: Secondary | ICD-10-CM | POA: Diagnosis not present

## 2017-02-02 DIAGNOSIS — Z7984 Long term (current) use of oral hypoglycemic drugs: Secondary | ICD-10-CM

## 2017-02-02 DIAGNOSIS — R0600 Dyspnea, unspecified: Secondary | ICD-10-CM | POA: Diagnosis present

## 2017-02-02 DIAGNOSIS — E039 Hypothyroidism, unspecified: Secondary | ICD-10-CM | POA: Diagnosis present

## 2017-02-02 LAB — COMPREHENSIVE METABOLIC PANEL
ALBUMIN: 2.9 g/dL — AB (ref 3.5–5.0)
ALT: 18 U/L (ref 14–54)
AST: 25 U/L (ref 15–41)
Alkaline Phosphatase: 116 U/L (ref 38–126)
Anion gap: 15 (ref 5–15)
BILIRUBIN TOTAL: 0.3 mg/dL (ref 0.3–1.2)
BUN: 65 mg/dL — AB (ref 6–20)
CO2: 23 mmol/L (ref 22–32)
CREATININE: 9.38 mg/dL — AB (ref 0.44–1.00)
Calcium: 9.5 mg/dL (ref 8.9–10.3)
Chloride: 98 mmol/L — ABNORMAL LOW (ref 101–111)
GFR calc Af Amer: 5 mL/min — ABNORMAL LOW (ref >60)
GFR calc non Af Amer: 4 mL/min — ABNORMAL LOW (ref >60)
GLUCOSE: 134 mg/dL — AB (ref 65–99)
Potassium: 4.4 mmol/L (ref 3.5–5.1)
SODIUM: 136 mmol/L (ref 135–145)
Total Protein: 8.6 g/dL — ABNORMAL HIGH (ref 6.5–8.1)

## 2017-02-02 LAB — CBC WITH DIFFERENTIAL/PLATELET
BASOS ABS: 0 10*3/uL (ref 0.0–0.1)
BASOS PCT: 0 %
EOS ABS: 0.1 10*3/uL (ref 0.0–0.7)
EOS PCT: 1 %
HEMATOCRIT: 36 % (ref 36.0–46.0)
Hemoglobin: 10.5 g/dL — ABNORMAL LOW (ref 12.0–15.0)
Lymphocytes Relative: 2 %
Lymphs Abs: 0.4 10*3/uL — ABNORMAL LOW (ref 0.7–4.0)
MCH: 28.8 pg (ref 26.0–34.0)
MCHC: 29.2 g/dL — ABNORMAL LOW (ref 30.0–36.0)
MCV: 98.9 fL (ref 78.0–100.0)
MONO ABS: 1 10*3/uL (ref 0.1–1.0)
MONOS PCT: 6 %
NEUTROS ABS: 15.9 10*3/uL — AB (ref 1.7–7.7)
Neutrophils Relative %: 91 %
PLATELETS: 312 10*3/uL (ref 150–400)
RBC: 3.64 MIL/uL — ABNORMAL LOW (ref 3.87–5.11)
RDW: 17.5 % — AB (ref 11.5–15.5)
WBC: 17.4 10*3/uL — ABNORMAL HIGH (ref 4.0–10.5)

## 2017-02-02 LAB — CBG MONITORING, ED: GLUCOSE-CAPILLARY: 107 mg/dL — AB (ref 65–99)

## 2017-02-02 LAB — BRAIN NATRIURETIC PEPTIDE: B NATRIURETIC PEPTIDE 5: 493.1 pg/mL — AB (ref 0.0–100.0)

## 2017-02-02 LAB — I-STAT CG4 LACTIC ACID, ED: Lactic Acid, Venous: 2.14 mmol/L (ref 0.5–1.9)

## 2017-02-02 LAB — INFLUENZA PANEL BY PCR (TYPE A & B)
INFLBPCR: NEGATIVE
Influenza A By PCR: NEGATIVE

## 2017-02-02 LAB — GLUCOSE, CAPILLARY: GLUCOSE-CAPILLARY: 118 mg/dL — AB (ref 65–99)

## 2017-02-02 LAB — MRSA PCR SCREENING: MRSA by PCR: NEGATIVE

## 2017-02-02 MED ORDER — FLUTICASONE PROPIONATE 50 MCG/ACT NA SUSP
2.0000 | Freq: Every day | NASAL | Status: DC
Start: 2017-02-02 — End: 2017-02-04
  Filled 2017-02-02: qty 16

## 2017-02-02 MED ORDER — ORAL CARE MOUTH RINSE
15.0000 mL | Freq: Two times a day (BID) | OROMUCOSAL | Status: DC
  Administered 2017-02-03 (×2): 15 mL via OROMUCOSAL

## 2017-02-02 MED ORDER — SODIUM CHLORIDE 0.9 % IV SOLN: 250.0000 mL | INTRAVENOUS | Status: DC | PRN

## 2017-02-02 MED ORDER — BOOST / RESOURCE BREEZE PO LIQD
2.0000 | Freq: Two times a day (BID) | ORAL | Status: DC
  Administered 2017-02-03 – 2017-02-04 (×2): 2 via ORAL

## 2017-02-02 MED ORDER — HEPARIN SODIUM (PORCINE) 5000 UNIT/ML IJ SOLN
5000.0000 [IU] | Freq: Three times a day (TID) | INTRAMUSCULAR | Status: DC
  Administered 2017-02-02 – 2017-02-04 (×6): 5000 [IU] via SUBCUTANEOUS
  Filled 2017-02-02 (×3): qty 1

## 2017-02-02 MED ORDER — VANCOMYCIN HCL 10 G IV SOLR
2500.0000 mg | Freq: Once | INTRAVENOUS | Status: AC
  Administered 2017-02-02: 2500 mg via INTRAVENOUS
  Filled 2017-02-02: qty 2500

## 2017-02-02 MED ORDER — CLINDAMYCIN PHOSPHATE 1 % EX GEL
1.0000 "application " | Freq: Two times a day (BID) | CUTANEOUS | Status: DC
  Administered 2017-02-02 – 2017-02-04 (×4): 1 via TOPICAL
  Filled 2017-02-02: qty 30

## 2017-02-02 MED ORDER — CALCITRIOL 0.5 MCG PO CAPS: 1.5000 ug | ORAL_CAPSULE | ORAL | Status: DC

## 2017-02-02 MED ORDER — SODIUM CHLORIDE 0.9% FLUSH
3.0000 mL | Freq: Two times a day (BID) | INTRAVENOUS | Status: DC
  Administered 2017-02-02 – 2017-02-03 (×3): 3 mL via INTRAVENOUS

## 2017-02-02 MED ORDER — RENA-VITE PO TABS: 1.0000 | ORAL_TABLET | Freq: Every day | ORAL | Status: DC

## 2017-02-02 MED ORDER — CALCIUM ACETATE (PHOS BINDER) 667 MG PO CAPS
667.0000 mg | ORAL_CAPSULE | Freq: Three times a day (TID) | ORAL | Status: DC
  Filled 2017-02-02: qty 1

## 2017-02-02 MED ORDER — FAMOTIDINE 20 MG PO TABS
20.0000 mg | ORAL_TABLET | Freq: Every day | ORAL | Status: DC
  Administered 2017-02-02 – 2017-02-03 (×2): 20 mg via ORAL
  Filled 2017-02-02 (×2): qty 1

## 2017-02-02 MED ORDER — VANCOMYCIN HCL IN DEXTROSE 1-5 GM/200ML-% IV SOLN: 1000.0000 mg | Freq: Once | INTRAVENOUS | Status: DC

## 2017-02-02 MED ORDER — ACETAMINOPHEN 650 MG RE SUPP: 650.0000 mg | Freq: Four times a day (QID) | RECTAL | Status: DC | PRN

## 2017-02-02 MED ORDER — MIDODRINE HCL 5 MG PO TABS
10.0000 mg | ORAL_TABLET | ORAL | Status: DC
  Administered 2017-02-03: 10 mg via ORAL

## 2017-02-02 MED ORDER — ASPIRIN EC 81 MG PO TBEC
81.0000 mg | DELAYED_RELEASE_TABLET | Freq: Every day | ORAL | Status: DC
  Administered 2017-02-02 – 2017-02-03 (×2): 81 mg via ORAL
  Filled 2017-02-02 (×2): qty 1

## 2017-02-02 MED ORDER — ACETAMINOPHEN 500 MG PO TABS
1000.0000 mg | ORAL_TABLET | Freq: Once | ORAL | Status: AC
Start: 2017-02-02 — End: 2017-02-02
  Administered 2017-02-02: 1000 mg via ORAL
  Filled 2017-02-02: qty 2

## 2017-02-02 MED ORDER — PRAVASTATIN SODIUM 40 MG PO TABS
40.0000 mg | ORAL_TABLET | Freq: Every day | ORAL | Status: DC
  Administered 2017-02-03 – 2017-02-04 (×2): 40 mg via ORAL
  Filled 2017-02-02 (×2): qty 1

## 2017-02-02 MED ORDER — CLINDAMYCIN PHOSPHATE 1 % EX SOLN
1.0000 "application " | Freq: Two times a day (BID) | CUTANEOUS | Status: DC
  Filled 2017-02-02: qty 30

## 2017-02-02 MED ORDER — OXYCODONE-ACETAMINOPHEN 5-325 MG PO TABS: 1.0000 | ORAL_TABLET | Freq: Four times a day (QID) | ORAL | Status: DC | PRN

## 2017-02-02 MED ORDER — PIPERACILLIN-TAZOBACTAM 3.375 G IVPB 30 MIN: 3.3750 g | Freq: Once | INTRAVENOUS | Status: DC

## 2017-02-02 MED ORDER — LEVOTHYROXINE SODIUM 75 MCG PO TABS
75.0000 ug | ORAL_TABLET | Freq: Every day | ORAL | Status: DC
  Administered 2017-02-03 – 2017-02-04 (×2): 75 ug via ORAL
  Filled 2017-02-02 (×2): qty 1

## 2017-02-02 MED ORDER — CALCIUM ACETATE (PHOS BINDER) 667 MG PO CAPS
667.0000 mg | ORAL_CAPSULE | Freq: Three times a day (TID) | ORAL | Status: DC
  Administered 2017-02-03 – 2017-02-04 (×5): 667 mg via ORAL
  Filled 2017-02-02 (×6): qty 1

## 2017-02-02 MED ORDER — INSULIN ASPART 100 UNIT/ML ~~LOC~~ SOLN
0.0000 [IU] | Freq: Every day | SUBCUTANEOUS | Status: DC
Start: 2017-02-02 — End: 2017-02-04

## 2017-02-02 MED ORDER — SODIUM CHLORIDE 0.9% FLUSH: 3.0000 mL | INTRAVENOUS | Status: DC | PRN

## 2017-02-02 MED ORDER — PIPERACILLIN-TAZOBACTAM 3.375 G IVPB
3.3750 g | Freq: Two times a day (BID) | INTRAVENOUS | Status: DC
  Administered 2017-02-02 – 2017-02-04 (×5): 3.375 g via INTRAVENOUS
  Filled 2017-02-02 (×7): qty 50

## 2017-02-02 MED ORDER — CINACALCET HCL 30 MG PO TABS: 60.0000 mg | ORAL_TABLET | ORAL | Status: DC

## 2017-02-02 MED ORDER — DULOXETINE HCL 60 MG PO CPEP
60.0000 mg | ORAL_CAPSULE | Freq: Every day | ORAL | Status: DC
  Administered 2017-02-03 – 2017-02-04 (×2): 60 mg via ORAL
  Filled 2017-02-02 (×2): qty 1

## 2017-02-02 MED ORDER — IOPAMIDOL (ISOVUE-300) INJECTION 61%
INTRAVENOUS | Status: AC
  Filled 2017-02-02: qty 100

## 2017-02-02 MED ORDER — ACETAMINOPHEN 325 MG PO TABS
650.0000 mg | ORAL_TABLET | Freq: Four times a day (QID) | ORAL | Status: DC | PRN
  Administered 2017-02-03: 650 mg via ORAL
  Filled 2017-02-02: qty 2

## 2017-02-02 MED ORDER — SEVELAMER CARBONATE 800 MG PO TABS
2400.0000 mg | ORAL_TABLET | Freq: Three times a day (TID) | ORAL | Status: DC
  Administered 2017-02-03 – 2017-02-04 (×5): 2400 mg via ORAL
  Filled 2017-02-02 (×5): qty 3

## 2017-02-02 MED ORDER — INSULIN ASPART 100 UNIT/ML ~~LOC~~ SOLN
0.0000 [IU] | Freq: Three times a day (TID) | SUBCUTANEOUS | Status: DC
  Administered 2017-02-03: 1 [IU] via SUBCUTANEOUS
  Administered 2017-02-03: 5 [IU] via SUBCUTANEOUS
  Administered 2017-02-04: 2 [IU] via SUBCUTANEOUS

## 2017-02-02 MED ORDER — RENA-VITE PO TABS
1.0000 | ORAL_TABLET | Freq: Every day | ORAL | Status: DC
  Administered 2017-02-02 – 2017-02-03 (×2): 1 via ORAL
  Filled 2017-02-02 (×2): qty 1

## 2017-02-02 MED ORDER — SODIUM CHLORIDE 0.9% FLUSH: 3.0000 mL | Freq: Two times a day (BID) | INTRAVENOUS | Status: DC

## 2017-02-02 NOTE — Progress Notes (Addendum)
Pharmacy Antibiotic Note  [name] is a 56 y.o. female admitted on 02/02/2017 with sepsis.  Pharmacy has been consulted for vancomycin/zosyn dosing. ESRD on MWF (has not been to HD today). Tmax 100.7, no labs available yet. Noted allergy to amoxicillin but tolerates Zosyn per documentation.  Plan: Vancomycin 2500mg  IV x 1 Zosyn 3.375g IV q12h (4h infusion) Monitor clinical progress, c/s, abx plan/LOT Pre-HD vancomycin level as indicated F/u HD schedule/tolerance inpatient for abx maintenance doses      Temp (24hrs), Avg:100.7 F (38.2 C), Min:100.7 F (38.2 C), Max:100.7 F (38.2 C)  No results for input(s): WBC, CREATININE, LATICACIDVEN, VANCOTROUGH, VANCOPEAK, VANCORANDOM, GENTTROUGH, GENTPEAK, GENTRANDOM, TOBRATROUGH, TOBRAPEAK, TOBRARND, AMIKACINPEAK, AMIKACINTROU, AMIKACIN in the last 168 hours.  CrCl cannot be calculated (Patient's most recent lab result is older than the maximum 21 days allowed.).    Allergies  Allergen Reactions  . Amoxicillin Rash and Other (See Comments)    Tolerated Zosyn 01/2013. [place]  . Amoxicillin-Pot Clavulanate Diarrhea    Elicia Lamp, PharmD, BCPS Clinical Pharmacist 02/02/2017 11:25 AM

## 2017-02-02 NOTE — Progress Notes (Signed)
New Admission Note: transfer from ED  Arrival Method: stretcher Mental Orientation:  A/o x4 Telemetry: placed Assessment: Completed Skin: Hidradenitis suppurativa to lower panus, groin, perineum, and in between buttock IV: LH SL Pain: 3/10 from Hidradenitis suppurativa Tubes: none Safety Measures: Safety Fall Prevention Plan has been given, discussed and signed Admission: Completed Unit Orientation: Patient has been orientated to the room, unit and staff.  Family: pts mother at bedside  Orders have been reviewed and implemented. Will continue to monitor the patient. Call light has been placed within reach and bed alarm has been activated.   Retta Mac BSN, RN

## 2017-02-02 NOTE — Consult Note (Signed)
[facility] Renal Consultation Note    Indication for Consultation:  Management of ESRD/hemodialysis; anemia, hypertension/volume and secondary hyperparathyroidism  HPI: [name] is a 56 y.o. female with ESRD on hemodialysis MWF, CHF, chronic hypotension, hidradenitis with recurrent superinfection. [name] presents to ED today with fever, chills, diaphoresis, and dizziness upon waking. She is seen in ED with her mother present who reports lesions draining lesions on buttocks. Followed by ID and has been on PO doxycycline and Flagyl.  ED Course : Temp 100.61F BP 100/55 Pulse 102 Resp 25 WBC 17.4 Lactic acid 2.14 K 4.4 Hgb 10.5. Blood cultures drawn and IV Vanc/Zosyn started. [name] was considering leaving hospital today, but has agreed to stay and is admitted for sepsis w/u. Mother tells her she will not allow her to go home today. Denies SOB, chest pain, cough, abdominal pain, nausea, vomiting.  Last HD Wednesday 3/6, compliant with HD and meeting EDW, but does have some episodes of hypotension during Hd.    Past Medical History:  Diagnosis Date  . Chronic diastolic CHF (congestive heart failure) ([place])   . Chronic kidney disease (CKD), stage III (moderate)    [name] nephrology lov note 05-12-2013 on pt chart now on HD  . DCM (dilated cardiomyopathy) ([place])    nonischemic - EF now 50-55% by echo 2014  . Depression   . DM type 2 (diabetes mellitus, type 2) ([facility])    diet controlledwith retinopathy and nephropathy  . GERD (gastroesophageal reflux disease)   . H/O cardiac arrest 10/2012  . Hard of hearing 10/2012   now wears 2 hearing aids  . History of hemodialysis dec 2013  . HTN (hypertension)   . Hydradenitis   . Hyperkalemia   . Hyperlipidemia   . Hypothyroidism   . Iron deficiency anemia   . Morbid obesity ([place])   . Multinodular goiter   . Pneumonia Nov 10, 2012  . Sleep apnea    no test yet per mother 10/22/13   . Tinnitus 12/26/2016  . Urinary tract  infection    taking antibiotics for 3 days prior to surgery   Past Surgical History:  Procedure Laterality Date  . AV FISTULA PLACEMENT Left 07/25/2013   Procedure: ARTERIOVENOUS (AV) FISTULA CREATION ;  Surgeon: Rosetta Posner, MD;  Location: [place];  Service: Vascular;  Laterality: Left;  . CYSTOSCOPY W/ RETROGRADES  11/16/2012   Procedure: CYSTOSCOPY WITH RETROGRADE PYELOGRAM;  Surgeon: Reece Packer, MD;  Location: [facility];  Service: Urology;  Laterality: Bilateral;  CYSTOSCOPY,BILATERAL RETROGRADE PYELOGRAM/ REMOVAL LEFT URETERAL STENT/ FULGERATION BLADDER MUCOSA/ INSERTION RIGHT URETERAL STENT  . CYSTOSCOPY W/ URETERAL STENT PLACEMENT  05/28/2012   Procedure: CYSTOSCOPY WITH RETROGRADE PYELOGRAM/URETERAL STENT PLACEMENT;  Surgeon: Reece Packer, MD;  Location: [facility];  Service: Urology;  Laterality: Left;  . CYSTOSCOPY WITH RETROGRADE PYELOGRAM, URETEROSCOPY AND STENT PLACEMENT Right 06/23/2013   Procedure: CYSTOSCOPY WITH RIGHT URETEROSCOPY, RIGHT  RETROGRADE PYELOGRAM, WITH LASER LIPOTRIPSY AND RIGHT URETERAL STENT EXCHANGE ;  Surgeon: Molli Hazard, MD;  Location: [facility];  Service: Urology;  Laterality: Right;  STENT EXCHANGE    . CYSTOSCOPY WITH RETROGRADE PYELOGRAM, URETEROSCOPY AND STENT PLACEMENT Right 11/06/2013   Procedure: CYSTOSCOPY WITH RETROGRADE PYELOGRAM, URETEROSCOPY STONE REMOVAL AND STENT REMOVAL;  Surgeon: Molli Hazard, MD;  Location: [facility];  Service: Urology;  Laterality: Right;  . HOLMIUM LASER APPLICATION N/A 0/34/7425   Procedure: HOLMIUM LASER APPLICATION;  Surgeon: Molli Hazard, MD;  Location: [facility];  Service: Urology;  Laterality: N/A;  . INSERTION OF DIALYSIS CATHETER N/A 09/24/2013   Procedure: INSERTION OF DIALYSIS CATHETER;  Surgeon: Rosetta Posner, MD;  Location: [place];  Service: Vascular;  Laterality: N/A;  . PORT-A-CATH REMOVAL    . PORTACATH PLACEMENT    . REVISON OF ARTERIOVENOUS FISTULA Left 07/25/2016   Procedure: REVISON  OF LEFT ARTERIOVENOUS FISTULA;  Surgeon: Elam Dutch, MD;  Location: [facility];  Service: Vascular;  Laterality: Left;   Family History  Problem Relation Age of Onset  . Coronary artery disease Mother   . Hypertension Mother   . Diabetes type II Mother   . Coronary artery disease Father   . Hypertension Father   . Malignant hyperthermia Father   . Cancer Maternal Grandfather     ? Type  . Kidney failure Maternal Grandmother    Social History:  reports that she quit smoking about 37 years ago. Her smoking use included Cigarettes. She has a 0.25 pack-year smoking history. She has never used smokeless tobacco. She reports that she does not drink alcohol or use drugs. Allergies  Allergen Reactions  . Amoxicillin Rash and Other (See Comments)    Tolerated Zosyn 01/2013. [place]  . Amoxicillin-Pot Clavulanate Diarrhea   Prior to Admission medications   Medication Sig Start Date End Date Taking? Authorizing Provider  aspirin EC 81 MG tablet Take 81 mg by mouth at bedtime.    Yes Historical Provider, MD  Azelastine-Fluticasone 137-50 MCG/ACT SUSP Place 2 sprays into the nose daily. 01/30/17  Yes Tammy S Parrett, NP  Calcium Acetate 667 MG TABS Take 667 mg by mouth 3 (three) times daily with meals. And snacks   Yes Historical Provider, MD  clindamycin (CLEOCIN T) 1 % external solution Apply 1 application topically 2 (two) times daily.  08/21/16  Yes Historical Provider, MD  doxycycline (VIBRAMYCIN) 100 MG capsule Take 1 capsule (100 mg total) by mouth 2 (two) times daily. 12/29/16  Yes Truman Hayward, MD  DULoxetine (CYMBALTA) 60 MG capsule Take 1 capsule (60 mg total) by mouth daily. 12/03/12  Yes Janece Canterbury, MD  famotidine (PEPCID) 20 MG tablet TAKE 1 TABLET BY MOUTH AT BEDTIME Patient taking differently: Take 20 mg by mouth at bedtime 10/15/14  Yes Tanda Rockers, MD  feeding supplement, RESOURCE BREEZE, (RESOURCE BREEZE) LIQD Take 2 Containers by mouth 2 (two) times daily between  meals.    Yes Historical Provider, MD  glipiZIDE (GLUCOTROL) 5 MG tablet Take 0.5 tablets (2.5 mg total) by mouth daily. 08/31/16  Yes Debbe Odea, MD  levofloxacin (LEVAQUIN) 500 MG tablet Take 1 tablet (500 mg total) by mouth every other day. 1st dose day after the 750mg  dose 12/29/16  Yes Truman Hayward, MD  levothyroxine (SYNTHROID, LEVOTHROID) 75 MCG tablet Take 75 mcg by mouth daily. 08/17/15  Yes Historical Provider, MD  midodrine (PROAMATINE) 10 MG tablet Take 1 tablet (10 mg total) by mouth 3 (three) times daily. Patient taking differently: Take 10 mg by mouth every Monday, Wednesday, and Friday. Dialysis days 08/31/16  Yes Debbe Odea, MD  multivitamin (RENA-VIT) TABS tablet Take 1 tablet by mouth daily. 08/16/15  Yes Historical Provider, MD  oxyCODONE-acetaminophen (PERCOCET/ROXICET) 5-325 MG tablet Take 1-2 tablets by mouth every 6 (six) hours as needed. Patient has not taken since started dialysis 08/2013 due to causes hypotension Patient taking differently: Take 1-2 tablets by mouth every 6 (six) hours as needed for moderate pain.  07/25/16  Yes  Alvia Grove, PA-C  pravastatin (PRAVACHOL) 40 MG tablet Take 40 mg by mouth daily. Patient takes in am 12/03/12  Yes Janece Canterbury, MD  Probiotic Product (PROBIOTIC DAILY PO) Take 1 tablet by mouth daily.    Yes Historical Provider, MD  sevelamer carbonate (RENVELA) 800 MG tablet Take 2,400 mg by mouth 3 (three) times daily with meals.   Yes Historical Provider, MD  sucroferric oxyhydroxide (VELPHORO) 500 MG chewable tablet Chew 500 mg by mouth 3 (three) times daily with meals.   Yes Historical Provider, MD   Current Facility-Administered Medications  Medication Dose Route Frequency Provider Last Rate Last Dose  . piperacillin-tazobactam (ZOSYN) IVPB 3.375 g  3.375 g Intravenous Q12H Romona Curls, [place] at 02/02/17 1544   Current Outpatient Prescriptions  Medication Sig Dispense Refill  . aspirin EC 81 MG tablet Take 81 mg by  mouth at bedtime.     . Azelastine-Fluticasone 137-50 MCG/ACT SUSP Place 2 sprays into the nose daily. 1 Bottle 3  . Calcium Acetate 667 MG TABS Take 667 mg by mouth 3 (three) times daily with meals. And snacks    . clindamycin (CLEOCIN T) 1 % external solution Apply 1 application topically 2 (two) times daily.     Marland Kitchen doxycycline (VIBRAMYCIN) 100 MG capsule Take 1 capsule (100 mg total) by mouth 2 (two) times daily. 42 capsule 1  . DULoxetine (CYMBALTA) 60 MG capsule Take 1 capsule (60 mg total) by mouth daily. 30 capsule 0  . famotidine (PEPCID) 20 MG tablet TAKE 1 TABLET BY MOUTH AT BEDTIME (Patient taking differently: Take 20 mg by mouth at bedtime) 30 tablet 0  . feeding supplement, RESOURCE BREEZE, (RESOURCE BREEZE) LIQD Take 2 Containers by mouth 2 (two) times daily between meals.     Marland Kitchen glipiZIDE (GLUCOTROL) 5 MG tablet Take 0.5 tablets (2.5 mg total) by mouth daily. 30 tablet 0  . levofloxacin (LEVAQUIN) 500 MG tablet Take 1 tablet (500 mg total) by mouth every other day. 1st dose day after the 750mg  dose 10 tablet 1  . levothyroxine (SYNTHROID, LEVOTHROID) 75 MCG tablet Take 75 mcg by mouth daily.  0  . midodrine (PROAMATINE) 10 MG tablet Take 1 tablet (10 mg total) by mouth 3 (three) times daily. (Patient taking differently: Take 10 mg by mouth every Monday, Wednesday, and Friday. Dialysis days) 90 tablet 0  . multivitamin (RENA-VIT) TABS tablet Take 1 tablet by mouth daily.  6  . oxyCODONE-acetaminophen (PERCOCET/ROXICET) 5-325 MG tablet Take 1-2 tablets by mouth every 6 (six) hours as needed. Patient has not taken since started dialysis 08/2013 due to causes hypotension (Patient taking differently: Take 1-2 tablets by mouth every 6 (six) hours as needed for moderate pain. ) 6 tablet 0  . pravastatin (PRAVACHOL) 40 MG tablet Take 40 mg by mouth daily. Patient takes in am    . Probiotic Product (PROBIOTIC DAILY PO) Take 1 tablet by mouth daily.     . sevelamer carbonate (RENVELA) 800 MG  tablet Take 2,400 mg by mouth 3 (three) times daily with meals.    . sucroferric oxyhydroxide (VELPHORO) 500 MG chewable tablet Chew 500 mg by mouth 3 (three) times daily with meals.      ROS: As per HPI otherwise negative.  Physical Exam: Vitals:   02/02/17 1301 02/02/17 1312 02/02/17 1430 02/02/17 1500  BP: 105/73  106/56 107/62  Pulse: 96  93 (!) 48  Resp: 20  20 24   Temp:  98.2 F (36.8  C)    TempSrc:  Oral    SpO2: 98%  100% 100%     General: Morbidly obese AAF laying on side NAD  Head: NCAT sclera not icteric MMM Neck: Supple. No JVD No masses Lungs: CTA bilaterally without wheezes, rales, or rhonchi. Breathing is unlabored. Heart: Tachy with S1 S2 Abdomen: soft NT + BS Skin: multiple open lesions to buttocks with serosanguinous drainage  Lower extremities:without edema or ischemic changes, no open wounds  Neuro: A & O  X 3. Moves all extremities spontaneously. Psych:  Responds to questions appropriately with a normal affect. Dialysis Access: LAVF +bruit   Labs: Basic Metabolic Panel:  Recent Labs Lab 02/02/17 1109  NA 136  K 4.4  CL 98*  CO2 23  GLUCOSE 134*  BUN 65*  CREATININE 9.38*  CALCIUM 9.5   Liver Function Tests:  Recent Labs Lab 02/02/17 1109  AST 25  ALT 18  ALKPHOS 116  BILITOT 0.3  PROT 8.6*  ALBUMIN 2.9*   No results for input(s): LIPASE, AMYLASE in the last 168 hours. No results for input(s): AMMONIA in the last 168 hours. CBC:  Recent Labs Lab 02/02/17 1109  WBC 17.4*  NEUTROABS 15.9*  HGB 10.5*  HCT 36.0  MCV 98.9  PLT 312   Cardiac Enzymes: No results for input(s): CKTOTAL, CKMB, CKMBINDEX, TROPONINI in the last 168 hours. CBG:  Recent Labs Lab 02/02/17 1209  GLUCAP 107*   Iron Studies: No results for input(s): IRON, TIBC, TRANSFERRIN, FERRITIN in the last 72 hours. Studies/Results: Dg Chest Portable 1 View  Result Date: 02/02/2017 CLINICAL DATA:  Shortness of breath and flu-like symptoms for the past several  days. EXAM: PORTABLE CHEST 1 VIEW COMPARISON:  10/03/2016; 08/25/2016; 11/06/2013 FINDINGS: Examination is degraded due to patient body habitus and portable technique. Grossly unchanged cardiac silhouette and mediastinal contours given reduced lung volumes. There is persistent thickening of the right paratracheal stripe secondary to prominent vasculature. Mild pulmonary venous congestion without frank evidence of edema. Chronic elevation of the right hemidiaphragm. No focal airspace opacities. No pleural effusion or pneumothorax. No evidence of edema. No acute osseous abnormalities. IMPRESSION: Degraded examination without acute cardiopulmonary disease. Specifically, no discrete focal airspace opacities to suggest pneumonia. Further evaluation with a PA and lateral chest radiograph may be obtained as clinically indicated. Electronically Signed   By: Sandi Mariscal M.D.   On: 02/02/2017 12:48    Dialysis Orders: [facility] MWF 4.5h  154 kg   2/2.5 bath  F200  500/800   Hep 5000 +[id number]   L arm AVF - Mircera 225 mcg q 2 weeks (last dose 3/7) - Calcitriol 1.50 mcg q HD  - Sensipar 60 mg po q HD   Assessment/Plan: 1.  Sepsis- fever, tachycardia, tachypnea on admit - IV abx started blood cultures pending - per primary  2. Hidradenitis recurrent infections - per primary  3.  ESRD -  MWF - no need for urgent dialysis today can hold until tomorrow when more stable  4.  Hypotension/volume - chronic hypotension on midodrine  UF to EDW as tolerated   5.  Anemia  - Hgb 10.5, follow on OP ESA   6.  Metabolic bone disease -  Cont VDRA/Sensipar - hold Velphoro (fe binder) with sepsis - follow renal panel  7.  Nutrition - Renal det/vitamins  Lynnda Child PA-C [facility] Pager [phone] 02/02/2017, 4:19 PM   Pt seen, examined and agree w A/P as above.  Kelly Splinter MD [facility] pager [phone]   02/02/2017, 5:26 PM

## 2017-02-02 NOTE — ED Provider Notes (Addendum)
[facility] DEPT Provider Note   CSN: [id number] Arrival date & time: 02/02/17  1024     History   Chief Complaint Chief Complaint  Patient presents with  . Shortness of Breath    HPI [name] is a 56 y.o. female.Patient awakened this morning with generalized weakness, fever of 102.4 and slight left-sided temporal headache. She denies any shortness of breath denies chest pain. No other associated symptoms. No treatment prior to coming here. Nothing makes symptoms better or worse. Last hemodialysis session was 2 days ago. No treatment prior to coming here.  HPI  Past Medical History:  Diagnosis Date  . Chronic diastolic CHF (congestive heart failure) ([place])   . Chronic kidney disease (CKD), stage III (moderate)    [name] nephrology lov note 05-12-2013 on pt chart now on HD  . DCM (dilated cardiomyopathy) ([place])    nonischemic - EF now 50-55% by echo 2014  . Depression   . DM type 2 (diabetes mellitus, type 2) (H[facility])    diet controlledwith retinopathy and nephropathy  . GERD (gastroesophageal reflux disease)   . H/O cardiac arrest 10/2012  . Hard of hearing 10/2012   now wears 2 hearing aids  . History of hemodialysis dec 2013  . HTN (hypertension)   . Hydradenitis   . Hyperkalemia   . Hyperlipidemia   . Hypothyroidism   . Iron deficiency anemia   . Morbid obesity ([place])   . Multinodular goiter   . Pneumonia Nov 10, 2012  . Sleep apnea    no test yet per mother 10/22/13   . Tinnitus 12/26/2016  . Urinary tract infection    taking antibiotics for 3 days prior to surgery    Patient Active Problem List   Diagnosis Date Noted  . Tinnitus 12/26/2016  . Streptococcal bacteremia   . ESRD (end stage renal disease) on dialysis ([place]) 08/25/2016  . Sepsis affecting skin ([place]) 08/25/2016  . Absolute anemia   . Pressure ulcer 05/01/2015  . Fever 04/30/2015  . Hydradenitis 04/30/2015  . Chronic respiratory failure ([place]) 11/10/2013  . COPD (chronic obstructive  pulmonary disease) ([place]) 11/06/2013  . DCM (dilated cardiomyopathy) ([place])   . End stage renal disease ([place]) 08/26/2013  . UTI (urinary tract infection) 07/18/2013  . OSA (obstructive sleep apnea) 07/17/2013  . Chronic diastolic CHF (congestive heart failure) ([place]) 07/16/2013  . SOB (shortness of breath) 07/11/2013  . Metabolic acidosis 37/62/8315  . Leucocytosis 01/24/2013  . Hypertension 11/30/2012  . Atelectasis 11/19/2012  . Sepsis ([place]) 11/10/2012  . Septic shock ([place]) 08/17/2012  . ARF (acute renal failure) ([place]) 06/03/2012  . Hydronephrosis of left kidney 05/28/2012  . Nephrolithiasis 05/28/2012  . Hidradenitis suppurativa 05/21/2012  . Symptomatic anemia 05/21/2012  . DM type 2 (diabetes mellitus, type 2) ([place])   . Chronic kidney disease (CKD), stage V ([place])   . Mood disorder in conditions classified elsewhere 09/06/2010  . ERYTHEMA NODOSUM, HX OF 07/27/2010  . GOITER, MULTINODULAR 05/18/2010  . Morbid obesity ([place]) 05/18/2010  . GERD 05/18/2010    Past Surgical History:  Procedure Laterality Date  . AV FISTULA PLACEMENT Left 07/25/2013   Procedure: ARTERIOVENOUS (AV) FISTULA CREATION ;  Surgeon: Rosetta Posner, MD;  Location: [facility];  Service: Vascular;  Laterality: Left;  . CYSTOSCOPY W/ RETROGRADES  11/16/2012   Procedure: CYSTOSCOPY WITH RETROGRADE PYELOGRAM;  Surgeon: Reece Packer, MD;  Location: [facility];  Service: Urology;  Laterality: Bilateral;  CYSTOSCOPY,BILATERAL RETROGRADE PYELOGRAM/ REMOVAL LEFT URETERAL STENT/ FULGERATION  BLADDER MUCOSA/ INSERTION RIGHT URETERAL STENT  . CYSTOSCOPY W/ URETERAL STENT PLACEMENT  05/28/2012   Procedure: CYSTOSCOPY WITH RETROGRADE PYELOGRAM/URETERAL STENT PLACEMENT;  Surgeon: Reece Packer, MD;  Location: [facility];  Service: Urology;  Laterality: Left;  . CYSTOSCOPY WITH RETROGRADE PYELOGRAM, URETEROSCOPY AND STENT PLACEMENT Right 06/23/2013   Procedure: CYSTOSCOPY WITH RIGHT URETEROSCOPY, RIGHT  RETROGRADE PYELOGRAM, WITH LASER  LIPOTRIPSY AND RIGHT URETERAL STENT EXCHANGE ;  Surgeon: Molli Hazard, MD;  Location: [facility];  Service: Urology;  Laterality: Right;  STENT EXCHANGE    . CYSTOSCOPY WITH RETROGRADE PYELOGRAM, URETEROSCOPY AND STENT PLACEMENT Right 11/06/2013   Procedure: CYSTOSCOPY WITH RETROGRADE PYELOGRAM, URETEROSCOPY STONE REMOVAL AND STENT REMOVAL;  Surgeon: Molli Hazard, MD;  Location: [facility];  Service: Urology;  Laterality: Right;  . HOLMIUM LASER APPLICATION N/A 4/40/3474   Procedure: HOLMIUM LASER APPLICATION;  Surgeon: Molli Hazard, MD;  Location: [facility];  Service: Urology;  Laterality: N/A;  . INSERTION OF DIALYSIS CATHETER N/A 09/24/2013   Procedure: INSERTION OF DIALYSIS CATHETER;  Surgeon: Rosetta Posner, MD;  Location: [facility];  Service: Vascular;  Laterality: N/A;  . PORT-A-CATH REMOVAL    . PORTACATH PLACEMENT    . REVISON OF ARTERIOVENOUS FISTULA Left 07/25/2016   Procedure: REVISON OF LEFT ARTERIOVENOUS FISTULA;  Surgeon: Elam Dutch, MD;  Location: [facility];  Service: Vascular;  Laterality: Left;    OB History    No data available       Home Medications    Prior to Admission medications   Medication Sig Start Date End Date Taking? Authorizing Provider  aspirin EC 81 MG tablet Take 81 mg by mouth daily.    Historical Provider, MD  Azelastine-Fluticasone 137-50 MCG/ACT SUSP Place 2 sprays into the nose daily. 01/30/17   Melvenia Needles, NP  Calcium Acetate 667 MG TABS Take 2,001 mg by mouth 2 (two) times daily with a meal.     Historical Provider, MD  clindamycin (CLEOCIN T) 1 % external solution  08/21/16   Historical Provider, MD  doxycycline (VIBRAMYCIN) 100 MG capsule Take 1 capsule (100 mg total) by mouth 2 (two) times daily. 12/29/16   Truman Hayward, MD  DULoxetine (CYMBALTA) 60 MG capsule Take 1 capsule (60 mg total) by mouth daily. 12/03/12   Janece Canterbury, MD  famotidine (PEPCID) 20 MG tablet TAKE 1 TABLET BY MOUTH AT BEDTIME Patient taking  differently: Take 20 mg by mouth at bedtime 10/15/14   Tanda Rockers, MD  feeding supplement, RESOURCE BREEZE, (RESOURCE BREEZE) LIQD Take 1 Container by mouth 3 (three) times daily with meals.    Historical Provider, MD  glipiZIDE (GLUCOTROL) 5 MG tablet Take 0.5 tablets (2.5 mg total) by mouth daily. 08/31/16   Debbe Odea, MD  levofloxacin (LEVAQUIN) 500 MG tablet Take 1 tablet (500 mg total) by mouth every other day. 1st dose day after the 750mg  dose 12/29/16   Truman Hayward, MD  levothyroxine (SYNTHROID, LEVOTHROID) 75 MCG tablet Take 75 mcg by mouth daily. 08/17/15   Historical Provider, MD  midodrine (PROAMATINE) 10 MG tablet Take 1 tablet (10 mg total) by mouth 3 (three) times daily. Patient taking differently: Take 10 mg by mouth daily.  08/31/16   Debbe Odea, MD  Multiple Vitamin (MULTIVITAMIN) tablet Take 1 tablet by mouth daily.    Historical Provider, MD  multivitamin (RENA-VIT) TABS tablet Take 1 tablet by mouth daily. 08/16/15   Historical Provider, MD  oxyCODONE-acetaminophen (PERCOCET/ROXICET) 5-325  MG tablet Take 1-2 tablets by mouth every 6 (six) hours as needed. Patient has not taken since started dialysis 08/2013 due to causes hypotension Patient taking differently: Take 1-2 tablets by mouth every 6 (six) hours as needed for moderate pain.  07/25/16   Alvia Grove, PA-C  pravastatin (PRAVACHOL) 40 MG tablet Take 40 mg by mouth daily. Patient takes in am 12/03/12   Janece Canterbury, MD  Probiotic Product (PROBIOTIC DAILY PO) Take 1 tablet by mouth daily.     Historical Provider, MD  sucroferric oxyhydroxide (VELPHORO) 500 MG chewable tablet Chew 500 mg by mouth 3 (three) times daily with meals.    Historical Provider, MD    Family History Family History  Problem Relation Age of Onset  . Coronary artery disease Mother   . Hypertension Mother   . Diabetes type II Mother   . Coronary artery disease Father   . Hypertension Father   . Malignant hyperthermia Father   .  Cancer Maternal Grandfather     ? Type  . Kidney failure Maternal Grandmother     Social History Social History  Substance Use Topics  . Smoking status: Former Smoker    Packs/day: 0.25    Years: 1.00    Types: Cigarettes    Quit date: 11/28/1979  . Smokeless tobacco: Never Used  . Alcohol use No     Allergies   Amoxicillin and Amoxicillin-pot clavulanate   Review of Systems Review of Systems  Constitutional: Negative.   HENT: Negative.   Respiratory: Positive for shortness of breath.        Chronic dyspnea, oxygen dependent  Cardiovascular: Negative.   Gastrointestinal: Negative.   Musculoskeletal: Positive for gait problem.       Wheelchair-bound  Skin: Positive for wound.       Open draining wounds at perineum, chronic from hydroadenitis suppurativa. Mother reports that wounds look improved   Allergic/Immunologic: Positive for immunocompromised state.       Diabetic hemodialysis patient  Psychiatric/Behavioral: Negative.   All other systems reviewed and are negative.    Physical Exam Updated Vital Signs BP 100/55 (BP Location: Right Arm)   Pulse 102   Temp 100.7 F (38.2 C) (Oral)   Resp 25   LMP 06/13/2013   SpO2 100%   Physical Exam  Constitutional: She is oriented to person, place, and time.  Chronically ill-appearing  HENT:  Head: Normocephalic and atraumatic.  Eyes: Conjunctivae are normal. Pupils are equal, round, and reactive to light.  Neck: Neck supple. No tracheal deviation present. No thyromegaly present.  Cardiovascular: Regular rhythm.   No murmur heard. Mother tachycardic  Pulmonary/Chest: Effort normal and breath sounds normal.  Abdominal: Soft. Bowel sounds are normal. She exhibits no distension. There is no tenderness.  Morbidly obese  Musculoskeletal: Normal range of motion. She exhibits no edema or tenderness.  Left upper extremity with dialysis fistula with good thrill. Not warm red or tender  Neurological: She is alert and  oriented to person, place, and time. Coordination normal.  Skin: Skin is warm and dry. No rash noted.  Multiple open draining wounds at perineum and bilateral buttocks with foul-smelling drainage.  Psychiatric: She has a normal mood and affect.  Nursing note and vitals reviewed.    ED Treatments / Results  Labs (all labs ordered are listed, but only abnormal results are displayed) Labs Reviewed  CULTURE, BLOOD (ROUTINE X 2)  CULTURE, BLOOD (ROUTINE X 2)  COMPREHENSIVE METABOLIC PANEL  CBC WITH DIFFERENTIAL/PLATELET  URINALYSIS, ROUTINE W REFLEX MICROSCOPIC  INFLUENZA PANEL BY PCR (TYPE A & B)  I-STAT CG4 LACTIC ACID, ED   Results for orders placed or performed during the hospital encounter of 02/02/17  Comprehensive metabolic panel  Result Value Ref Range   Sodium 136 135 - 145 mmol/L   Potassium 4.4 3.5 - 5.1 mmol/L   Chloride 98 (L) 101 - 111 mmol/L   CO2 23 22 - 32 mmol/L   Glucose, Bld 134 (H) 65 - 99 mg/dL   BUN 65 (H) 6 - 20 mg/dL   Creatinine, Ser 9.38 (H) 0.44 - 1.00 mg/dL   Calcium 9.5 8.9 - 10.3 mg/dL   Total Protein 8.6 (H) 6.5 - 8.1 g/dL   Albumin 2.9 (L) 3.5 - 5.0 g/dL   AST 25 15 - 41 U/L   ALT 18 14 - 54 U/L   Alkaline Phosphatase 116 38 - 126 U/L   Total Bilirubin 0.3 0.3 - 1.2 mg/dL   GFR calc non Af Amer 4 (L) >60 mL/min   GFR calc Af Amer 5 (L) >60 mL/min   Anion gap 15 5 - 15  CBC WITH DIFFERENTIAL  Result Value Ref Range   WBC 17.4 (H) 4.0 - 10.5 K/uL   RBC 3.64 (L) 3.87 - 5.11 MIL/uL   Hemoglobin 10.5 (L) 12.0 - 15.0 g/dL   HCT 36.0 36.0 - 46.0 %   MCV 98.9 78.0 - 100.0 fL   MCH 28.8 26.0 - 34.0 pg   MCHC 29.2 (L) 30.0 - 36.0 g/dL   RDW 17.5 (H) 11.5 - 15.5 %   Platelets 312 150 - 400 K/uL   Neutrophils Relative % 91 %   Neutro Abs 15.9 (H) 1.7 - 7.7 K/uL   Lymphocytes Relative 2 %   Lymphs Abs 0.4 (L) 0.7 - 4.0 K/uL   Monocytes Relative 6 %   Monocytes Absolute 1.0 0.1 - 1.0 K/uL   Eosinophils Relative 1 %   Eosinophils Absolute 0.1 0.0  - 0.7 K/uL   Basophils Relative 0 %   Basophils Absolute 0.0 0.0 - 0.1 K/uL  Influenza panel by PCR (type A & B)  Result Value Ref Range   Influenza A By PCR NEGATIVE NEGATIVE   Influenza B By PCR NEGATIVE NEGATIVE  I-Stat CG4 Lactic Acid, ED  (not at  [facility])  Result Value Ref Range   Lactic Acid, Venous 2.14 (HH) 0.5 - 1.9 mmol/L   Comment NOTIFIED PHYSICIAN   CBG monitoring, ED  Result Value Ref Range   Glucose-Capillary 107 (H) 65 - 99 mg/dL   Dg Chest Portable 1 View  Result Date: 02/02/2017 CLINICAL DATA:  Shortness of breath and flu-like symptoms for the past several days. EXAM: PORTABLE CHEST 1 VIEW COMPARISON:  10/03/2016; 08/25/2016; 11/06/2013 FINDINGS: Examination is degraded due to patient body habitus and portable technique. Grossly unchanged cardiac silhouette and mediastinal contours given reduced lung volumes. There is persistent thickening of the right paratracheal stripe secondary to prominent vasculature. Mild pulmonary venous congestion without frank evidence of edema. Chronic elevation of the right hemidiaphragm. No focal airspace opacities. No pleural effusion or pneumothorax. No evidence of edema. No acute osseous abnormalities. IMPRESSION: Degraded examination without acute cardiopulmonary disease. Specifically, no discrete focal airspace opacities to suggest pneumonia. Further evaluation with a PA and lateral chest radiograph may be obtained as clinically indicated. Electronically Signed   By: Sandi Mariscal M.D.   On: 02/02/2017 12:48   Mm Digital Screening Bilateral  Result Date: 01/09/2017 CLINICAL  DATA:  Screening. History of benign bilateral breast biopsies. EXAM: DIGITAL SCREENING BILATERAL MAMMOGRAM WITH CAD COMPARISON:  Previous exam(s). ACR Breast Density Category b: There are scattered areas of fibroglandular density. FINDINGS: Biopsy site marker within the outer right breast is stable in position. There are no findings suspicious for malignancy within either  breast. Images were processed with CAD. IMPRESSION: No mammographic evidence of malignancy. A result letter of this screening mammogram will be mailed directly to the patient. RECOMMENDATION: Screening mammogram in one year. (Code:SM-B-01Y) BI-RADS CATEGORY  2: Benign. Electronically Signed   By: Franki Cabot M.D.   On: 01/09/2017 13:16   EKG  EKG Interpretation  Date/Time:  Friday February 02 2017 10:33:58 EST Ventricular Rate:  107 PR Interval:  138 QRS Duration: 90 QT Interval:  362 QTC Calculation: 483 R Axis:   77 Text Interpretation:  Sinus tachycardia with occasional Premature ventricular complexes Low voltage QRS Borderline ECG Premature ventricular complexes New since previous tracing Confirmed by Laraya Pestka  MD, Ancelmo Hunt [phone]) on 02/02/2017 11:12:33 AM     Chest x-ray viewed by me 2:25 PM patient resting comfortably after treatment with intravenous antibiotics. Skin is diaphoretic. I had multiple discussions with patient and she wanted to leave the hospital. I stressed to her that she is immunocompromised and has risk of worsening condition or death if she leaves without treatment and full assessment. She finally agreed to stay in the hospital Sepsis - Repeat Assessment  Performed at:    225 pm  Vitals     Blood pressure 105/73, pulse 96, temperature 98.2 F (36.8 C), temperature source Oral, resp. rate 20, last menstrual period 06/13/2013, SpO2 98 %.  Heart:     Regular rate and rhythm  Lungs:    CTA  Capillary Refill:   <2 sec  Peripheral Pulse:   Radial pulse palpable  Skin:     Normal Color and Diaphoretic   Radiology No results found.  Procedures Procedures (including critical care time)  Medications Ordered in ED Medications  piperacillin-tazobactam (ZOSYN) IVPB 3.375 g (not administered)  vancomycin (VANCOCIN) IVPB 1000 mg/200 mL premix (not administered)     Initial Impression / Assessment and Plan / ED Course  I have reviewed the triage vital signs and  the nursing notes.  Pertinent labs & imaging results that were available during my care of the patient were reviewed by me and considered in my medical decision making (see chart for details).     Code sepsis called based on Sirs criteria of fever, tachycardia, tachypnea. Source of infection currently unknown Hospitalist service consulted, Dr. Lonny Prude and will arrange for overnight stay. Anemia is chronic I've also consulted Dr.Schertz from nephrology service who will arrange for hemodialysis Final Clinical Impressions(s) / ED Diagnoses  Diagnosis #1 and differentiate sepsis Final diagnoses:  None  #2Anemia CRITICAL CARE Performed by: Orlie Dakin Total critical care time: 30 minutes Critical care time was exclusive of separately billable procedures and treating other patients. Critical care was necessary to treat or prevent imminent or life-threatening deterioration. Critical care was time spent personally by me on the following activities: development of treatment plan with patient and/or surrogate as well as nursing, discussions with consultants, evaluation of patient's response to treatment, examination of patient, obtaining history from patient or surrogate, ordering and performing treatments and interventions, ordering and review of laboratory studies, ordering and review of radiographic studies, pulse oximetry and re-evaluation of patient's condition. New Prescriptions New Prescriptions   No medications on file  Orlie Dakin, MD 02/02/17 [place], MD 02/02/17 1436

## 2017-02-02 NOTE — ED Triage Notes (Addendum)
Pt to ER from home for evaluation of shortness of breath onset this morning on awakening with fever 102.4 measured. Pt is dialysis patient, wears 2L O2 daily, sats 100% at present, patient able to speak in complete sentences. Pt reports all symptoms started today. A/o x4. Pt MWF dialysis, has not had tx today.

## 2017-02-02 NOTE — H&P (Signed)
History and Physical    [name] LZJ:[id number] DOB: Aug 11, 1961 DOA: 02/02/2017  PCP: [name], MD Patient coming from: Home  Chief Complaint: Fever  HPI: [name] is a 56 y.o. female with medical history significant of ESRD on dialysis, diabetes mellitus type 2, hydradenitis superior to the, such hypertension, morbid obesity, chronic diastolic CHF. Symptoms started this morning fever of 102.4 degrees farenheit with associated feeling dizzy and disoriented. She took Tylenol to help with her fever. She was supposed to go to dialysis today, however, her symptoms she came to the emergency department instead.  ED Course:  Vitals: Tmax of 100.82F. Normal heart rate. Normotensive. 100% SPO2 on 3 L nasal cannula Labs: Creatinine 9.38. Lactic acid 2.14. WBC 17.4 K. ANC 15.9 K. Influenza negative Imaging: Chest x-ray significant for vascular congestion with no definite evidence of pneumonia. Medications/Course: Code sepsis called. Patient started on vancomycin and Zosyn. No fluid boluses given. Given Tylenol 1000 mg for fever.  Review of Systems: Review of Systems  Constitutional: Positive for diaphoresis and fever. Negative for chills.  Respiratory: Positive for shortness of breath. Negative for cough, sputum production and wheezing.   Cardiovascular: Positive for orthopnea and PND (when she does not have oxygen on). Negative for chest pain.  Gastrointestinal: Negative for constipation, diarrhea, nausea and vomiting.  Genitourinary: Negative for dysuria, frequency, hematuria and urgency.  Neurological: Negative for weakness.  All other systems reviewed and are negative.   Past Medical History:  Diagnosis Date  . Chronic diastolic CHF (congestive heart failure) ([place])   . Chronic kidney disease (CKD), stage III (moderate)    [name] nephrology lov note 05-12-2013 on pt chart now on HD  . DCM (dilated cardiomyopathy) ([place])    nonischemic - EF now 50-55% by echo 2014  .  Depression   . DM type 2 (diabetes mellitus, type 2) (HCC)    diet controlledwith retinopathy and nephropathy  . GERD (gastroesophageal reflux disease)   . H/O cardiac arrest 10/2012  . Hard of hearing 10/2012   now wears 2 hearing aids  . History of hemodialysis dec 2013  . HTN (hypertension)   . Hydradenitis   . Hyperkalemia   . Hyperlipidemia   . Hypothyroidism   . Iron deficiency anemia   . Morbid obesity ([place])   . Multinodular goiter   . Pneumonia Nov 10, 2012  . Sleep apnea    no test yet per mother 10/22/13   . Tinnitus 12/26/2016  . Urinary tract infection    taking antibiotics for 3 days prior to surgery    Past Surgical History:  Procedure Laterality Date  . AV FISTULA PLACEMENT Left 07/25/2013   Procedure: ARTERIOVENOUS (AV) FISTULA CREATION ;  Surgeon: Rosetta Posner, MD;  Location: [facility];  Service: Vascular;  Laterality: Left;  . CYSTOSCOPY W/ RETROGRADES  11/16/2012   Procedure: CYSTOSCOPY WITH RETROGRADE PYELOGRAM;  Surgeon: Reece Packer, MD;  Location: [facility];  Service: Urology;  Laterality: Bilateral;  CYSTOSCOPY,BILATERAL RETROGRADE PYELOGRAM/ REMOVAL LEFT URETERAL STENT/ FULGERATION BLADDER MUCOSA/ INSERTION RIGHT URETERAL STENT  . CYSTOSCOPY W/ URETERAL STENT PLACEMENT  05/28/2012   Procedure: CYSTOSCOPY WITH RETROGRADE PYELOGRAM/URETERAL STENT PLACEMENT;  Surgeon: Reece Packer, MD;  Location[facility];  Service: Urology;  Laterality: Left;  . CYSTOSCOPY WITH RETROGRADE PYELOGRAM, URETEROSCOPY AND STENT PLACEMENT Right 06/23/2013   Procedure: CYSTOSCOPY WITH RIGHT URETEROSCOPY, RIGHT  RETROGRADE PYELOGRAM, WITH LASER LIPOTRIPSY AND RIGHT URETERAL STENT EXCHANGE ;  Surgeon: Molli Hazard, MD;  Location[facility];  Service: Urology;  Laterality: Right;  STENT EXCHANGE    . CYSTOSCOPY WITH RETROGRADE PYELOGRAM, URETEROSCOPY AND STENT PLACEMENT Right 11/06/2013   Procedure: CYSTOSCOPY WITH RETROGRADE PYELOGRAM, URETEROSCOPY STONE REMOVAL AND STENT  REMOVAL;  Surgeon: Molli Hazard, MD;  [facility];  Service: Urology;  Laterality: Right;  . HOLMIUM LASER APPLICATION N/A 07/06/1750   Procedure: HOLMIUM LASER APPLICATION;  Surgeon: Molli Hazard, MD;  [facility];  Service: Urology;  Laterality: N/A;  . INSERTION OF DIALYSIS CATHETER N/A 09/24/2013   Procedure: INSERTION OF DIALYSIS CATHETER;  Surgeon: Rosetta Posner, MD;  Location: [facility];  Service: Vascular;  Laterality: N/A;  . PORT-A-CATH REMOVAL    . PORTACATH PLACEMENT    . REVISON OF ARTERIOVENOUS FISTULA Left 07/25/2016   Procedure: REVISON OF LEFT ARTERIOVENOUS FISTULA;  Surgeon: Elam Dutch, MD;  Location: [facility];  Service: Vascular;  Laterality: Left;     reports that she quit smoking about 37 years ago. Her smoking use included Cigarettes. She has a 0.25 pack-year smoking history. She has never used smokeless tobacco. She reports that she does not drink alcohol or use drugs.  Allergies  Allergen Reactions  . Amoxicillin Rash and Other (See Comments)    Tolerated Zosyn 01/2013.   . Amoxicillin-Pot Clavulanate Diarrhea    Family History  Problem Relation Age of Onset  . Coronary artery disease Mother   . Hypertension Mother   . Diabetes type II Mother   . Coronary artery disease Father   . Hypertension Father   . Malignant hyperthermia Father   . Cancer Maternal Grandfather     ? Type  . Kidney failure Maternal Grandmother     Prior to Admission medications   Medication Sig Start Date End Date Taking? Authorizing Provider  aspirin EC 81 MG tablet Take 81 mg by mouth at bedtime.    Yes Historical Provider, MD  Azelastine-Fluticasone 137-50 MCG/ACT SUSP Place 2 sprays into the nose daily. 01/30/17  Yes Tammy S Parrett, NP  Calcium Acetate 667 MG TABS Take 667 mg by mouth 3 (three) times daily with meals. And snacks   Yes Historical Provider, MD  clindamycin (CLEOCIN T) 1 % external solution Apply 1 application topically 2 (two) times  daily.  08/21/16  Yes Historical Provider, MD  doxycycline (VIBRAMYCIN) 100 MG capsule Take 1 capsule (100 mg total) by mouth 2 (two) times daily. 12/29/16  Yes Truman Hayward, MD  DULoxetine (CYMBALTA) 60 MG capsule Take 1 capsule (60 mg total) by mouth daily. 12/03/12  Yes Janece Canterbury, MD  famotidine (PEPCID) 20 MG tablet TAKE 1 TABLET BY MOUTH AT BEDTIME Patient taking differently: Take 20 mg by mouth at bedtime 10/15/14  Yes Tanda Rockers, MD  feeding supplement, RESOURCE BREEZE, (RESOURCE BREEZE) LIQD Take 2 Containers by mouth 2 (two) times daily between meals.    Yes Historical Provider, MD  glipiZIDE (GLUCOTROL) 5 MG tablet Take 0.5 tablets (2.5 mg total) by mouth daily. 08/31/16  Yes Debbe Odea, MD  levofloxacin (LEVAQUIN) 500 MG tablet Take 1 tablet (500 mg total) by mouth every other day. 1st dose day after the 750mg  dose 12/29/16  Yes Truman Hayward, MD  levothyroxine (SYNTHROID, LEVOTHROID) 75 MCG tablet Take 75 mcg by mouth daily. 08/17/15  Yes Historical Provider, MD  midodrine (PROAMATINE) 10 MG tablet Take 1 tablet (10 mg total) by mouth 3 (three) times daily. Patient taking differently: Take 10 mg by mouth every  Monday, Wednesday, and Friday. Dialysis days 08/31/16  Yes Debbe Odea, MD  multivitamin (RENA-VIT) TABS tablet Take 1 tablet by mouth daily. 08/16/15  Yes Historical Provider, MD  oxyCODONE-acetaminophen (PERCOCET/ROXICET) 5-325 MG tablet Take 1-2 tablets by mouth every 6 (six) hours as needed. Patient has not taken since started dialysis 08/2013 due to causes hypotension Patient taking differently: Take 1-2 tablets by mouth every 6 (six) hours as needed for moderate pain.  07/25/16  Yes Alvia Grove, PA-C  pravastatin (PRAVACHOL) 40 MG tablet Take 40 mg by mouth daily. Patient takes in am 12/03/12  Yes Janece Canterbury, MD  Probiotic Product (PROBIOTIC DAILY PO) Take 1 tablet by mouth daily.    Yes Historical Provider, MD  sevelamer carbonate (RENVELA) 800 MG tablet  Take 2,400 mg by mouth 3 (three) times daily with meals.   Yes Historical Provider, MD  sucroferric oxyhydroxide (VELPHORO) 500 MG chewable tablet Chew 500 mg by mouth 3 (three) times daily with meals.   Yes Historical Provider, MD    Physical Exam: Vitals:   02/02/17 1312 02/02/17 1430 02/02/17 1500 02/02/17 1618  BP:  106/56 107/62 102/73  Pulse:  93 (!) 48 (!) 48  Resp:  20 24 21   Temp: 98.2 F (36.8 C)     TempSrc: Oral     SpO2:  100% 100% 99%     Constitutional: NAD, calm, comfortable, morbidly obese Eyes: PERRL, lids and conjunctivae normal ENMT: Mucous membranes are moist. Posterior pharynx clear of any exudate or lesions.  Neck: normal, supple, no masses, no thyromegaly Respiratory: Bibasilar crackles. No wheezing. Normal respiratory effort. No accessory muscle use.  Cardiovascular: Regular rate and rhythm, no murmurs. No extremity edema. 1+ pedal pulses. Abdomen: no tenderness, no masses palpated. Obese Bowel sounds positive.  Musculoskeletal: no clubbing / cyanosis. No joint deformity upper and lower extremities. Good ROM, no contractures. Normal muscle tone.  Skin: Thickening of skin in gluteal fold, perineum and groin area. Draining lesion on upper right thigh/grown area with frank purulent drainage expressed. No tenderness of the area with no erythema. Neurologic: CN 2-12 grossly intact. Sensation intact, DTR normal. Strength 5/5 in all 4.  Psychiatric: Normal judgment and insight. Alert and oriented x 3. Normal mood.   Labs on Admission: I have personally reviewed following labs and imaging studies  CBC:  Recent Labs Lab 02/02/17 1109  WBC 17.4*  NEUTROABS 15.9*  HGB 10.5*  HCT 36.0  MCV 98.9  PLT 409   Basic Metabolic Panel:  Recent Labs Lab 02/02/17 1109  NA 136  K 4.4  CL 98*  CO2 23  GLUCOSE 134*  BUN 65*  CREATININE 9.38*  CALCIUM 9.5   GFR: Estimated Creatinine Clearance: 10.5 mL/min (by C-G formula based on SCr of 9.38 mg/dL (H)). Liver  Function Tests:  Recent Labs Lab 02/02/17 1109  AST 25  ALT 18  ALKPHOS 116  BILITOT 0.3  PROT 8.6*  ALBUMIN 2.9*   No results for input(s): LIPASE, AMYLASE in the last 168 hours. No results for input(s): AMMONIA in the last 168 hours. Coagulation Profile: No results for input(s): INR, PROTIME in the last 168 hours. Cardiac Enzymes: No results for input(s): CKTOTAL, CKMB, CKMBINDEX, TROPONINI in the last 168 hours. BNP (last 3 results)  Recent Labs  12/28/16 1217  PROBNP 12,642*   HbA1C: No results for input(s): HGBA1C in the last 72 hours. CBG:  Recent Labs Lab 02/02/17 1209  GLUCAP 107*   Lipid Profile: No results for input(s): CHOL,  HDL, LDLCALC, TRIG, CHOLHDL, LDLDIRECT in the last 72 hours. Thyroid Function Tests: No results for input(s): TSH, T4TOTAL, FREET4, T3FREE, THYROIDAB in the last 72 hours. Anemia Panel: No results for input(s): VITAMINB12, FOLATE, FERRITIN, TIBC, IRON, RETICCTPCT in the last 72 hours. Urine analysis:    Component Value Date/Time   COLORURINE YELLOW 07/18/2013 0831   APPEARANCEUR CLOUDY (A) 07/18/2013 0831   LABSPEC 1.012 07/18/2013 0831   PHURINE 5.0 07/18/2013 0831   GLUCOSEU NEGATIVE 07/18/2013 0831   HGBUR SMALL (A) 07/18/2013 0831   BILIRUBINUR NEGATIVE 07/18/2013 0831   KETONESUR NEGATIVE 07/18/2013 0831   PROTEINUR 30 (A) 07/18/2013 0831   UROBILINOGEN 0.2 07/18/2013 0831   NITRITE NEGATIVE 07/18/2013 0831   LEUKOCYTESUR MODERATE (A) 07/18/2013 0831   Sepsis Labs: !!!!!!!!!!!!!!!!!!!!!!!!!!!!!!!!!!!!!!!!!!!! @LABRCNTIP (procalcitonin:4,lacticidven:4) )No results found for this or any previous visit (from the past 240 hour(s)).   Radiological Exams on Admission: Dg Chest Portable 1 View  Result Date: 02/02/2017 CLINICAL DATA:  Shortness of breath and flu-like symptoms for the past several days. EXAM: PORTABLE CHEST 1 VIEW COMPARISON:  10/03/2016; 08/25/2016; 11/06/2013 FINDINGS: Examination is degraded due to patient  body habitus and portable technique. Grossly unchanged cardiac silhouette and mediastinal contours given reduced lung volumes. There is persistent thickening of the right paratracheal stripe secondary to prominent vasculature. Mild pulmonary venous congestion without frank evidence of edema. Chronic elevation of the right hemidiaphragm. No focal airspace opacities. No pleural effusion or pneumothorax. No evidence of edema. No acute osseous abnormalities. IMPRESSION: Degraded examination without acute cardiopulmonary disease. Specifically, no discrete focal airspace opacities to suggest pneumonia. Further evaluation with a PA and lateral chest radiograph may be obtained as clinically indicated. Electronically Signed   By: Sandi Mariscal M.D.   On: 02/02/2017 12:48    EKG: Independently reviewed. PVCs. Sinus rhythm. Tachycardia.  Assessment/Plan Principal Problem:   SIRS (systemic inflammatory response syndrome) (HCC) Active Problems:   Morbid obesity ([place])   DM type 2 (diabetes mellitus, type 2) (HCC)   Chronic kidney disease (CKD), stage V (HCC)   Hidradenitis suppurativa   Hypertension   SOB (shortness of breath)   Chronic diastolic CHF (congestive heart failure) (HCC)   OSA (obstructive sleep apnea)   Chronic respiratory failure (HCC)   ESRD (end stage renal disease) on dialysis ([place])  SIRS Unknown source. Fever in addition to leukocytosis and tachycardia (mild). -continue vancomycin/zosyn -urine culture if able to produce sample -blood cultures pending -CT Abdomen/pelvis to evaluate for any deep abscess  Fever No obvious evidence of infection. Management above.  Dyspnea May have a component of volume overload. She is due for dialysis today. -dialysis  ESRD on dialysis MWF. Nephrology consulted by EDP  Chronic respiratory failure O2 used at home prn and overnight -continue oxygen  Essential hypertension Patient is more hypotensive than hypertensive. She is on midodrine for  hypotension with dialysis.  Chronic diastolic heart failure May have mild exacerbation with some increased dyspnea, orthopnea, and crackles at bases. Unknown dry weight. Due for dialysis today. Last EF of 60-65% with no mention of diastolic dysfunction on last echo on 08/31/2016 -dialysis -BNP -daily weight  Obstructive sleep apnea No formal sleep study performed. She does not currently use a CPAP machine  Hydradenitis suppurativa Chronic. Followed by Dr. Tommy Medal of infectious disease and is on chronic doxycycline and Levaquin -hold outpatient medications while receiving empiric antibiotic coverage for SIRS and unknown source  Morbid obesity BMI of >50  Diabetes mellitus -SSI   DVT prophylaxis: Heparin Code Status:  Full code Family Communication: Mother at bedside Disposition Plan: Discharge pending workup for SIRS Consults called: Nephrology by EDP Admission status: Inpatient, telemetry   Cordelia Poche, MD [facility] Pager [phone]  If 7PM-7AM, please contact night-coverage [email or web address] Password [email or web address]  02/02/2017, 4:40 PM

## 2017-02-02 NOTE — ED Notes (Signed)
Patient refused X-ray when she was there because she said that she couldn't lay down that long. MD notified and we agreed to do a portable  Order placed

## 2017-02-02 NOTE — ED Notes (Signed)
Report given.

## 2017-02-02 NOTE — ED Notes (Signed)
Pt near syncopal while wheeling patient back to room. Diaphoretic. Moved to stretcher and placed on monitor.

## 2017-02-02 NOTE — ED Notes (Signed)
X-ray at bedside

## 2017-02-02 NOTE — ED Notes (Signed)
Unsuccessful in drawing the lactic acid patient is an extremely hard stick.

## 2017-02-03 LAB — CBC
HCT: 36 % (ref 36.0–46.0)
HEMOGLOBIN: 10.3 g/dL — AB (ref 12.0–15.0)
MCH: 28.3 pg (ref 26.0–34.0)
MCHC: 28.6 g/dL — AB (ref 30.0–36.0)
MCV: 98.9 fL (ref 78.0–100.0)
Platelets: 287 10*3/uL (ref 150–400)
RBC: 3.64 MIL/uL — AB (ref 3.87–5.11)
RDW: 17.9 % — ABNORMAL HIGH (ref 11.5–15.5)
WBC: 14.3 10*3/uL — AB (ref 4.0–10.5)

## 2017-02-03 LAB — GLUCOSE, CAPILLARY
GLUCOSE-CAPILLARY: 121 mg/dL — AB (ref 65–99)
Glucose-Capillary: 119 mg/dL — ABNORMAL HIGH (ref 65–99)
Glucose-Capillary: 142 mg/dL — ABNORMAL HIGH (ref 65–99)
Glucose-Capillary: 265 mg/dL — ABNORMAL HIGH (ref 65–99)

## 2017-02-03 LAB — BASIC METABOLIC PANEL
ANION GAP: 17 — AB (ref 5–15)
BUN: 74 mg/dL — ABNORMAL HIGH (ref 6–20)
CALCIUM: 9.1 mg/dL (ref 8.9–10.3)
CO2: 17 mmol/L — ABNORMAL LOW (ref 22–32)
Chloride: 101 mmol/L (ref 101–111)
Creatinine, Ser: 10.45 mg/dL — ABNORMAL HIGH (ref 0.44–1.00)
GFR, EST AFRICAN AMERICAN: 4 mL/min — AB (ref >60)
GFR, EST NON AFRICAN AMERICAN: 4 mL/min — AB (ref >60)
Glucose, Bld: 123 mg/dL — ABNORMAL HIGH (ref 65–99)
Potassium: 5 mmol/L (ref 3.5–5.1)
SODIUM: 135 mmol/L (ref 135–145)

## 2017-02-03 LAB — HIV ANTIBODY (ROUTINE TESTING W REFLEX): HIV SCREEN 4TH GENERATION: NONREACTIVE

## 2017-02-03 MED ORDER — MIDODRINE HCL 5 MG PO TABS
ORAL_TABLET | ORAL | Status: AC
  Administered 2017-02-03: 10 mg via ORAL
  Filled 2017-02-03: qty 2

## 2017-02-03 MED ORDER — VANCOMYCIN HCL IN DEXTROSE 1-5 GM/200ML-% IV SOLN
1000.0000 mg | INTRAVENOUS | Status: AC
  Administered 2017-02-03: 1000 mg via INTRAVENOUS
  Filled 2017-02-03: qty 200

## 2017-02-03 MED ORDER — DILTIAZEM HCL 25 MG/5ML IV SOLN
5.0000 mg | INTRAVENOUS | Status: AC
  Administered 2017-02-03: 5 mg via INTRAVENOUS
  Filled 2017-02-03: qty 5

## 2017-02-03 MED ORDER — OXYCODONE-ACETAMINOPHEN 5-325 MG PO TABS
ORAL_TABLET | ORAL | Status: AC
  Filled 2017-02-03: qty 2

## 2017-02-03 NOTE — Progress Notes (Signed)
Patient arrived to unit per chair.  Reviewed treatment plan and this RN agrees.  Report received from bedside RN, Gwenlyn Perking.  Consent obtained.  Patient A & O X 4. Lung sounds diminished to ausculation in all fields. Generalized edema. Cardiac: NSR, BBB.  Prepped LUAVF with alcohol and cannulated with two 15 gauge needles.  Pulsation of blood noted.  Flushed access well with saline per protocol.  Connected and secured lines and initiated tx at 1009.  UF goal of 2500 mL and net fluid removal of 2000 mL.  Will continue to monitor.

## 2017-02-03 NOTE — Progress Notes (Signed)
Dialysis treatment terminated with 20 minutes remaining per nephrology due to elevated HR sustaining 140-150.  1384 mL ultrafiltrated and net fluid removal 884 mL.    Patient status unchanged. Lung sounds diminished to ausculation in all fields. Genealized edema. Cardiac: ST.  Disconnected lines and removed needles.  Pressure held for 10 minutes and band aid/gauze dressing applied.  Report given to bedside RN, Gwenlyn Perking.

## 2017-02-03 NOTE — Consult Note (Signed)
[facility] Nurse wound consult note Reason for Consult: Patient is known to our department for consultation regarding chronic hydradenitis suppurativa; she is followed by Dr. Tommy Medal and is maintained on antibiotic therapy for this condition. She has also been treated at [facility] in the past.  She refuses Surgical consultatoin as she and her husband report a worsening of her condition following surgical intervention years ago. The primary lesions at this time are in the bilateral inguinal and in the buttocks/intragluteal cleft region where there are known fistulating tracts.   Wound type: Infectious Pressure Injury POA: No Measurement: The largest lesion at this time is full thickness in the right inguinal skin fold measuring 2cm round x 0.2cm, and red, moist, (patient is unable to tolerate exploration of the lesion with a cotton-tipped applicator).  There are numerous other, partial thickness and smaller lesions in the bilateral inguinal area with surrounding induration and maceration.  Drainage on ABD pads in these regions is light yellow to serosanguinous exudate in a small amount.  The patient and her husband state that the drainage from these lesions is mush less since being in the hospital and receiving IV antibiotics. The patient is not able to tolerate me opening the intragluteal skin fold to inspect the lesions there, but I can appreciate the induration and maceration in the surrounding area. There are scant areas of serosanguinous drainage on the underpad beneath the patient.  Wound bed:As described above Drainage (amount, consistency, odor) As described above Periwound: As described above Dressing procedure/placement/frequency: Patient does not wish to have a surgical consultation while here, preferring to be followed and managed by IP and Dr. Joneen Caraway as an outpatient. She is encouraged to follow up as previously arranged with him following discharge. The patient, her husband I and discuss skin care at  home and they are using a liquid antiperspirant/antimicrobial soap (Dial) and rinsing thoroughly twice daily. They use ABD pads at home and I offer the suggestion of using feminine hygiene pads as they are considerably less expensive than the ABDs. I will provide antimicrobial textile sheets ([employer]  that we use in house for moisture wicking and management if intertriginous lesions/dermatitis and she is instructed that she may use these at home and wash and reuse if she/they find them acceptable.  While washing will remove the antimicrobial feature of the product, they will still be able to wick away excess moisture in the intertriginous spaces. I reiterate that her disease is challenging and that I appreciate her difficulties at home.  I reinforce that this is not a disease of uncleanliness. She and her husband express gratitude for the time spent in consultation and the suggestions regarding feminine hygiene pads vs ABDs and provision of the [employer] product. [facility] nursing team will not follow, but will remain available to this patient, the nursing and medical teams.  Please re-consult if needed. Thanks, Maudie Flakes, MSN, RN, [place], [facility]  Pager# [phone]

## 2017-02-03 NOTE — Progress Notes (Signed)
[facility] Progress Note   Subjective: no c/o  Vitals:   02/03/17 1030 02/03/17 1100 02/03/17 1130 02/03/17 1200  BP: (!) 95/51 102/68 128/68 101/62  Pulse: 97 94 92 97  Resp:      Temp:      TempSrc:      SpO2:      Weight:      Height:        Inpatient medications: . aspirin EC  81 mg Oral QHS  . [START ON 02/05/2017] calcitRIOL  1.5 mcg Oral Q M,W,F-HD  . calcium acetate  667 mg Oral TID WC  . cinacalcet  60 mg Oral Q M,W,F-1800  . clindamycin  1 application Topical BID  . DULoxetine  60 mg Oral Daily  . famotidine  20 mg Oral QHS  . feeding supplement  2 Container Oral BID BM  . fluticasone  2 spray Each Nare Daily  . heparin  5,000 Units Subcutaneous Q8H  . insulin aspart  0-5 Units Subcutaneous QHS  . insulin aspart  0-9 Units Subcutaneous TID WC  . levothyroxine  75 mcg Oral QAC breakfast  . mouth rinse  15 mL Mouth Rinse BID  . [START ON 02/05/2017] midodrine  10 mg Oral Q M,W,F-HD  . multivitamin  1 tablet Oral QHS  . oxyCODONE-acetaminophen      . piperacillin-tazobactam (ZOSYN)  IV  3.375 g Intravenous Q12H  . pravastatin  40 mg Oral Daily  . sevelamer carbonate  2,400 mg Oral TID WC  . sodium chloride flush  3 mL Intravenous Q12H  . sodium chloride flush  3 mL Intravenous Q12H  . vancomycin  1,000 mg Intravenous Q Sat-HD    sodium chloride, acetaminophen **OR** acetaminophen, oxyCODONE-acetaminophen, sodium chloride flush  Exam: Obese AAF, no distress, on HD No jvd Chest clear bilat to bases RRR no mrg Abd marked obesity, nontender, +bs Skin - skin thickening in gluteal folds, perineum and groin areas.  Draining lesion R upper thigh/ groin area. L arm AVF+bruit NF, ox3  Dialysis: [facility] MWF 4.5h   154kg   2/2.5 bath  F200  500/800   Hep 5000 + [id number]    L arm AVF - Mircera 225 mcg q 2 weeks (last dose 3/7) - Calcitriol 1.50 mcg q HD  - Sensipar 60 mg po q HD       Assessment: 1. Fever/ draining skin abscess R thigh/ history  of hidradenitis suppurativa - on IV abx w vanc/ zosyn 2. ESRD HD MWF 3. Volume - is up 2kg 4. Chron resp failure - on home O2 5. OSA 6. MOrbid obesity 7. DM2 - on SSI 8. Hidradenitis suppurative - is on chronic OP abx per ID 9. Anemia of CKD - last esa on 3/7, Hb 10.3  10. 2HPTH - cont vdra/ sensipar  Plan - HD today off schedule   Kelly Splinter MD [facility] pager [phone]   02/03/2017, 12:12 PM    Recent Labs Lab 02/02/17 1109 02/03/17 0522  NA 136 135  K 4.4 5.0  CL 98* 101  CO2 23 17*  GLUCOSE 134* 123*  BUN 65* 74*  CREATININE 9.38* 10.45*  CALCIUM 9.5 9.1    Recent Labs Lab 02/02/17 1109  AST 25  ALT 18  ALKPHOS 116  BILITOT 0.3  PROT 8.6*  ALBUMIN 2.9*    Recent Labs Lab 02/02/17 1109 02/03/17 0522  WBC 17.4* 14.3*  NEUTROABS 15.9*  --   HGB 10.5* 10.3*  HCT 36.0  36.0  MCV 98.9 98.9  PLT 312 287   Iron/TIBC/Ferritin/ %Sat    Component Value Date/Time   IRON 34 (L) 12/22/2014 0756   TIBC 144 (L) 12/22/2014 0756   FERRITIN 780 (H) 12/22/2014 0756   IRONPCTSAT 24 12/22/2014 0756   IRONPCTSAT 10 (L) 06/01/2008 1547

## 2017-02-03 NOTE — Progress Notes (Signed)
PROGRESS NOTE    [name]  FWY:[id number] DOB: 1961/06/22 DOA: 02/02/2017 PCP: [name], MD   Brief Narrative:  [name] is a 56 y.o. female with medical history significant of ESRD on dialysis, diabetes mellitus type 2, hydradenitis superior to the, essential hypertension, morbid obesity, chronic diastolic CHF. She is here for recent fever and meeting SIRS criteria with concern for sepsis.   Assessment & Plan:   Principal Problem:   SIRS (systemic inflammatory response syndrome) (HCC) Active Problems:   Morbid obesity ([place])   DM type 2 (diabetes mellitus, type 2) (HCC)   Chronic kidney disease (CKD), stage V (HCC)   Hidradenitis suppurativa   Hypertension   SOB (shortness of breath)   Chronic diastolic CHF (congestive heart failure) (HCC)   OSA (obstructive sleep apnea)   Chronic respiratory failure (HCC)   ESRD (end stage renal disease) on dialysis ([place])   SIRS Unknown source. Fever in addition to leukocytosis and tachycardia (mild). Patient refusing CT abdomen/pelvis. Leukocytosis improved. -continue vancomycin/zosyn -urine culture pending -blood cultures pending  Tachycardia Possibly MAT. Given diltiazem push with resolution. -watch on telemetry  Frequent PVCs -will call cardiology tomorrow morning if still persisting  Fever No obvious evidence of infection. Management above.  Dyspnea May have a component of volume overload. She is due for dialysis today. -dialysis  ESRD on dialysis MWF. Nephrology consulted by EDP  Chronic respiratory failure O2 used at home prn and overnight -continue oxygen  Essential hypertension Patient is more hypotensive than hypertensive. She is on midodrine for hypotension with dialysis.  Chronic diastolic heart failure May have mild exacerbation with some increased dyspnea, orthopnea, and crackles at bases. Unknown dry weight. Due for dialysis today. Last EF of 60-65% with no mention of diastolic dysfunction  on last echo on 08/31/2016 -dialysis -BNP -daily weight  Obstructive sleep apnea No formal sleep study performed. She does not currently use a CPAP machine  Hydradenitis suppurativa Chronic. Followed by Dr. Tommy Medal of infectious disease and is on chronic doxycycline and Levaquin -hold outpatient medications while receiving empiric antibiotic coverage for SIRS and unknown source  Morbid obesity BMI of >50  Diabetes mellitus -SSI   DVT prophylaxis: Heparin Code Status: Full code Family Communication: None at bedside Disposition Plan: Discharge in 1-2 days pending workup for possible infection   Consultants:   Nephrology  Procedures:   Dialysis (MWF)  Antimicrobials:   Vancomycin  Zosyn    Subjective: Patient reports no issues overnight. No dyspnea or chest pain.  Objective: Vitals:   02/03/17 1230 02/03/17 1300 02/03/17 1345 02/03/17 1350  BP: (!) 143/80 112/72 (!) 153/103 (!) 154/108  Pulse: 94 69 (!) 149 (!) 144  Resp:   (!) 21   Temp:   99 F (37.2 C)   TempSrc:      SpO2:      Weight:   (!) 155.6 kg (343 lb 0.6 oz)   Height:        Intake/Output Summary (Last 24 hours) at 02/03/17 1443 Last data filed at 02/03/17 1350  Gross per 24 hour  Intake              270 ml  Output              884 ml  Net             -614 ml   Filed Weights   02/02/17 2127 02/03/17 0949 02/03/17 1345  Weight: (!) 156.5 kg (345 lb) (!) 156.4 kg (344  lb 12.8 oz) (!) 155.6 kg (343 lb 0.6 oz)    Examination:  General exam: Appears calm and comfortable Respiratory system: Clear to auscultation. Respiratory effort normal. Cardiovascular system: S1 & S2 heard, RRR. No murmurs, rubs, gallops or clicks. Gastrointestinal system: Abdomen is nondistended, soft and nontender. Normal bowel sounds heard. Central nervous system: Alert and oriented. No focal neurological deficits. Extremities: No edema. No calf tenderness Skin: No cyanosis. No rashes Psychiatry: Judgement and  insight appear normal. Mood & affect appropriate.     Data Reviewed: I have personally reviewed following labs and imaging studies  CBC:  Recent Labs Lab 02/02/17 1109 02/03/17 0522  WBC 17.4* 14.3*  NEUTROABS 15.9*  --   HGB 10.5* 10.3*  HCT 36.0 36.0  MCV 98.9 98.9  PLT 312 381   Basic Metabolic Panel:  Recent Labs Lab 02/02/17 1109 02/03/17 0522  NA 136 135  K 4.4 5.0  CL 98* 101  CO2 23 17*  GLUCOSE 134* 123*  BUN 65* 74*  CREATININE 9.38* 10.45*  CALCIUM 9.5 9.1   GFR: Estimated Creatinine Clearance: 9.5 mL/min (by C-G formula based on SCr of 10.45 mg/dL (H)). Liver Function Tests:  Recent Labs Lab 02/02/17 1109  AST 25  ALT 18  ALKPHOS 116  BILITOT 0.3  PROT 8.6*  ALBUMIN 2.9*   No results for input(s): LIPASE, AMYLASE in the last 168 hours. No results for input(s): AMMONIA in the last 168 hours. Coagulation Profile: No results for input(s): INR, PROTIME in the last 168 hours. Cardiac Enzymes: No results for input(s): CKTOTAL, CKMB, CKMBINDEX, TROPONINI in the last 168 hours. BNP (last 3 results)  Recent Labs  12/28/16 1217  PROBNP 12,642*   HbA1C: No results for input(s): HGBA1C in the last 72 hours. CBG:  Recent Labs Lab 02/02/17 1209 02/02/17 2125 02/03/17 0745  GLUCAP 107* 118* 119*   Lipid Profile: No results for input(s): CHOL, HDL, LDLCALC, TRIG, CHOLHDL, LDLDIRECT in the last 72 hours. Thyroid Function Tests: No results for input(s): TSH, T4TOTAL, FREET4, T3FREE, THYROIDAB in the last 72 hours. Anemia Panel: No results for input(s): VITAMINB12, FOLATE, FERRITIN, TIBC, IRON, RETICCTPCT in the last 72 hours. Sepsis Labs:  Recent Labs Lab 02/02/17 1135  LATICACIDVEN 2.14*    Recent Results (from the past 240 hour(s))  Blood Culture (routine x 2)     Status: None (Preliminary result)   Collection Time: 02/02/17 11:20 AM  Result Value Ref Range Status   Specimen Description BLOOD RIGHT HAND  Final   Special Requests  IN PEDIATRIC BOTTLE 2CC  Final   Culture NO GROWTH < 24 HOURS  Final   Report Status PENDING  Incomplete  Blood Culture (routine x 2)     Status: None (Preliminary result)   Collection Time: 02/02/17  6:36 PM  Result Value Ref Range Status   Specimen Description BLOOD RIGHT ANTECUBITAL  Final   Special Requests BOTTLES DRAWN AEROBIC ONLY 5CC  Final   Culture NO GROWTH < 24 HOURS  Final   Report Status PENDING  Incomplete  MRSA PCR Screening     Status: None   Collection Time: 02/02/17  8:53 PM  Result Value Ref Range Status   MRSA by PCR NEGATIVE NEGATIVE Final    Comment:        The GeneXpert MRSA Assay (FDA approved for NASAL specimens only), is one component of a comprehensive MRSA colonization surveillance program. It is not intended to diagnose MRSA infection nor to guide or monitor treatment  for MRSA infections.          Radiology Studies: Dg Chest Portable 1 View  Result Date: 02/02/2017 CLINICAL DATA:  Shortness of breath and flu-like symptoms for the past several days. EXAM: PORTABLE CHEST 1 VIEW COMPARISON:  10/03/2016; 08/25/2016; 11/06/2013 FINDINGS: Examination is degraded due to patient body habitus and portable technique. Grossly unchanged cardiac silhouette and mediastinal contours given reduced lung volumes. There is persistent thickening of the right paratracheal stripe secondary to prominent vasculature. Mild pulmonary venous congestion without frank evidence of edema. Chronic elevation of the right hemidiaphragm. No focal airspace opacities. No pleural effusion or pneumothorax. No evidence of edema. No acute osseous abnormalities. IMPRESSION: Degraded examination without acute cardiopulmonary disease. Specifically, no discrete focal airspace opacities to suggest pneumonia. Further evaluation with a PA and lateral chest radiograph may be obtained as clinically indicated. Electronically Signed   By: Sandi Mariscal M.D.   On: 02/02/2017 12:48        Scheduled  Meds: . aspirin EC  81 mg Oral QHS  . [START ON 02/05/2017] calcitRIOL  1.5 mcg Oral Q M,W,F-HD  . calcium acetate  667 mg Oral TID WC  . cinacalcet  60 mg Oral Q M,W,F-1800  . clindamycin  1 application Topical BID  . diltiazem  5 mg Intravenous STAT  . DULoxetine  60 mg Oral Daily  . famotidine  20 mg Oral QHS  . feeding supplement  2 Container Oral BID BM  . fluticasone  2 spray Each Nare Daily  . heparin  5,000 Units Subcutaneous Q8H  . insulin aspart  0-5 Units Subcutaneous QHS  . insulin aspart  0-9 Units Subcutaneous TID WC  . levothyroxine  75 mcg Oral QAC breakfast  . mouth rinse  15 mL Mouth Rinse BID  . [START ON 02/05/2017] midodrine  10 mg Oral Q M,W,F-HD  . multivitamin  1 tablet Oral QHS  . piperacillin-tazobactam (ZOSYN)  IV  3.375 g Intravenous Q12H  . pravastatin  40 mg Oral Daily  . sevelamer carbonate  2,400 mg Oral TID WC  . sodium chloride flush  3 mL Intravenous Q12H  . sodium chloride flush  3 mL Intravenous Q12H   Continuous Infusions:   LOS: 1 day     Cordelia Poche [facility] 02/03/2017, 2:43 PM Pager: [phone]  If 7PM-7AM, please contact night-coverage [email or web address] Password [facility] 02/03/2017, 2:43 PM

## 2017-02-03 NOTE — Progress Notes (Signed)
Pharmacy Antibiotic Note  [name] is a 56 y.o. female admitted on 02/02/2017 with sepsis.  Pharmacy has been consulted for vancomycin/zosyn dosing. ESRD on MWF (last outpatient HD 3/7). Per renal, will get catch up HD session Saturday 3/10. Noted allergy to amoxicillin but tolerates Zosyn per documentation.  Plan: -Vancomycin 1000mg  IV x 1 w/ HD today -Follow-up HD schedule for future doses -Zosyn 3.375g IV q12h (4h infusion) -Monitor clinical progress, c/s, abx plan/LOT -Pre-HD vancomycin level as indicated   Height: 5' 6.5" (168.9 cm) Weight: (!) 345 lb (156.5 kg) IBW/kg (Calculated) : 60.45  Temp (24hrs), Avg:99.2 F (37.3 C), Min:98.2 F (36.8 C), Max:100.7 F (38.2 C)   Recent Labs Lab 02/02/17 1109 02/02/17 1135 02/03/17 0522  WBC 17.4*  --  14.3*  CREATININE 9.38*  --  10.45*  LATICACIDVEN  --  2.14*  --     Estimated Creatinine Clearance: 9.5 mL/min (by C-G formula based on SCr of 10.45 mg/dL (H)).    Allergies  Allergen Reactions  . Amoxicillin Rash and Other (See Comments)    Tolerated Zosyn 01/2013. [place]  . Amoxicillin-Pot Clavulanate Diarrhea   Antimicrobials: 3/9 Zosyn >> 3/9 Vancomycin >> **2.5g IV x1 in ED 3/9 **1g IV x1 ordered w/ HD 3/10   Micro: 3/9 BCx: IP 3/9 MRSA PCR: neg  Arrie Senate, PharmD PGY-1 Pharmacy Resident Pager: [phone] 02/03/2017

## 2017-02-04 ENCOUNTER — Other Ambulatory Visit: Payer: Self-pay | Admitting: Cardiology

## 2017-02-04 DIAGNOSIS — Z8619 Personal history of other infectious and parasitic diseases: Secondary | ICD-10-CM

## 2017-02-04 DIAGNOSIS — I4819 Other persistent atrial fibrillation: Secondary | ICD-10-CM | POA: Diagnosis present

## 2017-02-04 DIAGNOSIS — Z8249 Family history of ischemic heart disease and other diseases of the circulatory system: Secondary | ICD-10-CM

## 2017-02-04 DIAGNOSIS — Z809 Family history of malignant neoplasm, unspecified: Secondary | ICD-10-CM

## 2017-02-04 DIAGNOSIS — I480 Paroxysmal atrial fibrillation: Secondary | ICD-10-CM

## 2017-02-04 DIAGNOSIS — Z881 Allergy status to other antibiotic agents status: Secondary | ICD-10-CM

## 2017-02-04 DIAGNOSIS — Z841 Family history of disorders of kidney and ureter: Secondary | ICD-10-CM

## 2017-02-04 DIAGNOSIS — Z87891 Personal history of nicotine dependence: Secondary | ICD-10-CM

## 2017-02-04 DIAGNOSIS — Z833 Family history of diabetes mellitus: Secondary | ICD-10-CM

## 2017-02-04 DIAGNOSIS — Z794 Long term (current) use of insulin: Secondary | ICD-10-CM

## 2017-02-04 LAB — GLUCOSE, CAPILLARY
Glucose-Capillary: 100 mg/dL — ABNORMAL HIGH (ref 65–99)
Glucose-Capillary: 166 mg/dL — ABNORMAL HIGH (ref 65–99)

## 2017-02-04 MED ORDER — DEXTROSE 5 % IV SOLN: 2.0000 g | INTRAVENOUS | Status: DC

## 2017-02-04 MED ORDER — VANCOMYCIN HCL IN DEXTROSE 1-5 GM/200ML-% IV SOLN: 1000.0000 mg | INTRAVENOUS | Status: DC

## 2017-02-04 MED ORDER — CARVEDILOL 3.125 MG PO TABS: 3.1250 mg | ORAL_TABLET | Freq: Two times a day (BID) | ORAL | 0 refills | Status: DC

## 2017-02-04 MED ORDER — CARVEDILOL 3.125 MG PO TABS
3.1250 mg | ORAL_TABLET | Freq: Two times a day (BID) | ORAL | Status: DC
  Administered 2017-02-04: 3.125 mg via ORAL
  Filled 2017-02-04: qty 1

## 2017-02-04 MED ORDER — DEXTROSE 5 % IV SOLN: 2.0000 g | INTRAVENOUS | 0 refills | Status: AC

## 2017-02-04 MED ORDER — VANCOMYCIN HCL IN DEXTROSE 1-5 GM/200ML-% IV SOLN: 1000.0000 mg | INTRAVENOUS | 0 refills | Status: DC

## 2017-02-04 NOTE — Progress Notes (Signed)
Pharmacy Antibiotic Note  [name] is a 56 y.o. female admitted on 02/02/2017 with sepsis.  Pharmacy has been consulted for vancomycin and ceftazidime dosing. ESRD on MWF (last outpatient HD 3/7). Patient was initially started on Zosyn but per ID recommendations will transition to ceftazidime. Per nephrology, planning for HD likely tomorrow and patient has already received Zosyn x1 today.  Plan: -Discontinue Zosyn -Initiate ceftazidime 2g IV qHD -Continue vancomycin 1000mg  IV qHD - F/U HD schedule closely -Monitor clinical progress, c/s, abx plan/LOT -Pre-HD vancomycin level as indicated   Height: 5' 6.5" (168.9 cm) Weight: (!) 343 lb 3.2 oz (155.7 kg) IBW/kg (Calculated) : 60.45  Temp (24hrs), Avg:98.5 F (36.9 C), Min:98.4 F (36.9 C), Max:98.6 F (37 C)   Recent Labs Lab 02/02/17 1109 02/02/17 1135 02/03/17 0522  WBC 17.4*  --  14.3*  CREATININE 9.38*  --  10.45*  LATICACIDVEN  --  2.14*  --     Estimated Creatinine Clearance: 9.5 mL/min (by C-G formula based on SCr of 10.45 mg/dL (H)).    Allergies  Allergen Reactions  . Amoxicillin Rash and Other (See Comments)    Tolerated Zosyn 01/2013. [place]  . Amoxicillin-Pot Clavulanate Diarrhea   Antimicrobials: 3/9 Zosyn >>3/11 3/11 Ceftazidime >> 3/9 Vancomycin >> **3/9 Vanco 2.5g IV x1 in ED **3/10 HD x4 hr (BFR @350 -400): 1g IV x1 ordered  Microbiology: 3/9 BCx: NGTD 3/9 MRSA PCR: neg  Arrie Senate, PharmD PGY-1 Pharmacy Resident Pager: [phone] 02/04/2017

## 2017-02-04 NOTE — Discharge Summary (Signed)
Physician Discharge Summary  [name] NLZ:[id number] DOB: 12-03-1960 DOA: 02/02/2017  PCP: [name], MD  Admit date: 02/02/2017 Discharge date: 02/04/2017  Admitted From: Home Disposition: Home  Recommendations for Outpatient Follow-up:  1. Follow up with PCP in 1 week 2. Two weeks of Vancomycin and Ceftazidime. End date: 02/16/2017 3. Follow up with cardiology 4. Follow up labs: Urine culture, blood culture  Discharge Condition: Stable CODE STATUS: Full code Diet recommendation: Renal/carb modified   Brief/Interim Summary:  Chief Complaint: Fever  HPI: [name] is a 56 y.o. female with medical history significant of ESRD on dialysis, diabetes mellitus type 2, hydradenitis superior to the, such hypertension, morbid obesity, chronic diastolic CHF. Symptoms started this morning fever of 102.4 degrees farenheit with associated feeling dizzy and disoriented. She took Tylenol to help with her fever. She was supposed to go to dialysis today, however, her symptoms she came to the emergency department instead.  ED Course:  Vitals: Tmax of 100.61F. Normal heart rate. Normotensive. 100% SPO2 on 3 L nasal cannula Labs: Creatinine 9.38. Lactic acid 2.14. WBC 17.4 K. ANC 15.9 K. Influenza negative Imaging: Chest x-ray significant for vascular congestion with no definite evidence of pneumonia. Medications/Course: Code sepsis called. Patient started on vancomycin and Zosyn. No fluid boluses given. Given Tylenol 1000 mg for fever.    Hospital course:  SIRS Fever in addition to leukocytosis and tachycardia (mild). Patient ill but not sepsis. Blood culture negative to date.  Tachycardia Possibly MAT. Given diltiazem push with resolution. Cardiology evaluated patient and started low dose Coreg 3.125mg  BID. Follow-up outpatient for placement of Holter monitor  Frequent PVCs Cardiology managed as above.  Fever Hydradenitis suppurativa Likely secondary to infected abscess from  hidradenitis suppurativa. Vancomycin and zosyn initially and transitioned to vancomycin and ceftazidime. To continue for a total of 2 weeks. End date: 02/16/2017. Discontinued home Levaquin and doxycycline. Continue topical Clindamycin.  Dyspnea Improved spontaneously.  ESRD on dialysis Received dialysis on 3/10.  Chronic respiratory failure O2 used at home prn and overnight. Continued oxygen  Essential hypertension Patient is more hypotensive than hypertensive. She is on midodrine for hypotension with dialysis. Coreg started for above problem.  Chronic diastolic heart failure Stable. Unknown dry weight. Last EF of 60-65% with no mention of diastolic dysfunction on last echo on 08/31/2016.  Obstructive sleep apnea No formal sleep study performed. She does not currently use a CPAP machine  Morbid obesity BMI of >50  Diabetes mellitus SSI while inpatient. Restart home medications on discharge.  Discharge Diagnoses:  Principal Problem:   SIRS (systemic inflammatory response syndrome) (HCC) Active Problems:   Morbid obesity ([place])   DM type 2 (diabetes mellitus, type 2) ([place])   Chronic kidney disease (CKD), stage V (HCC)   Hidradenitis suppurativa   Hypertension   SOB (shortness of breath)   Chronic diastolic CHF (congestive heart failure) (HCC)   OSA (obstructive sleep apnea)   Chronic respiratory failure (HCC)   ESRD (end stage renal disease) on dialysis ([place])   PAF (paroxysmal atrial fibrillation) ([place])    Discharge Instructions   Allergies as of 02/04/2017      Reactions   Amoxicillin Rash, Other (See Comments)   Tolerated Zosyn 01/2013. [place]   Amoxicillin-pot Clavulanate Diarrhea      Medication List    STOP taking these medications   doxycycline 100 MG capsule Commonly known as:  VIBRAMYCIN   levofloxacin 500 MG tablet Commonly known as:  LEVAQUIN     TAKE these medications  aspirin EC 81 MG tablet Take 81 mg by mouth at bedtime.    Azelastine-Fluticasone 137-50 MCG/ACT Susp Place 2 sprays into the nose daily.   Calcium Acetate 667 MG Tabs Take 667 mg by mouth 3 (three) times daily with meals. And snacks   carvedilol 3.125 MG tablet Commonly known as:  COREG Take 1 tablet (3.125 mg total) by mouth 2 (two) times daily with a meal.   cefTAZidime 2 g in dextrose 5 % 50 mL Inject 2 g into the vein every Monday, Wednesday, and Friday with hemodialysis. Start taking on:  02/05/2017   clindamycin 1 % external solution Commonly known as:  CLEOCIN T Apply 1 application topically 2 (two) times daily.   DULoxetine 60 MG capsule Commonly known as:  CYMBALTA Take 1 capsule (60 mg total) by mouth daily.   famotidine 20 MG tablet Commonly known as:  PEPCID TAKE 1 TABLET BY MOUTH AT BEDTIME What changed:  See the new instructions.   feeding supplement Liqd Take 2 Containers by mouth 2 (two) times daily between meals.   glipiZIDE 5 MG tablet Commonly known as:  GLUCOTROL Take 0.5 tablets (2.5 mg total) by mouth daily.   levothyroxine 75 MCG tablet Commonly known as:  SYNTHROID, LEVOTHROID Take 75 mcg by mouth daily.   midodrine 10 MG tablet Commonly known as:  PROAMATINE Take 1 tablet (10 mg total) by mouth 3 (three) times daily. What changed:  when to take this  additional instructions   multivitamin Tabs tablet Take 1 tablet by mouth daily.   oxyCODONE-acetaminophen 5-325 MG tablet Commonly known as:  PERCOCET/ROXICET Take 1-2 tablets by mouth every 6 (six) hours as needed. Patient has not taken since started dialysis 08/2013 due to causes hypotension What changed:  reasons to take this  additional instructions   pravastatin 40 MG tablet Commonly known as:  PRAVACHOL Take 40 mg by mouth daily. Patient takes in am   PROBIOTIC DAILY PO Take 1 tablet by mouth daily.   sevelamer carbonate 800 MG tablet Commonly known as:  RENVELA Take 2,400 mg by mouth 3 (three) times daily with meals.    vancomycin 1-5 GM/200ML-% Soln Commonly known as:  VANCOCIN Inject 200 mLs (1,000 mg total) into the vein every Monday, Wednesday, and Friday with hemodialysis. Start taking on:  02/05/2017   VELPHORO 500 MG chewable tablet Generic drug:  sucroferric oxyhydroxide Chew 500 mg by mouth 3 (three) times daily with meals.      Follow-up Information    WHITE,CYNTHIA S, MD. Schedule an appointment as soon as possible for a visit in 1 week(s).   Specialty:  Family Medicine Contact information: [address] [place] [address] [phone]        Fransico Him, MD. Schedule an appointment as soon as possible for a visit in 1 week(s).   Specialty:  Cardiology Why:  Holter monitor Contact information: [address] [place]  [address] [phone]          Allergies  Allergen Reactions  . Amoxicillin Rash and Other (See Comments)    Tolerated Zosyn 01/2013. [place]  . Amoxicillin-Pot Clavulanate Diarrhea    Consultations:  Infectious disease  Nephrology  Cardiology   Procedures/Studies: Dg Chest Portable 1 View  Result Date: 02/02/2017 CLINICAL DATA:  Shortness of breath and flu-like symptoms for the past several days. EXAM: PORTABLE CHEST 1 VIEW COMPARISON:  10/03/2016; 08/25/2016; 11/06/2013 FINDINGS: Examination is degraded due to patient body habitus and portable  technique. Grossly unchanged cardiac silhouette and mediastinal contours given reduced lung volumes. There is persistent thickening of the right paratracheal stripe secondary to prominent vasculature. Mild pulmonary venous congestion without frank evidence of edema. Chronic elevation of the right hemidiaphragm. No focal airspace opacities. No pleural effusion or pneumothorax. No evidence of edema. No acute osseous abnormalities. IMPRESSION: Degraded examination without acute cardiopulmonary disease. Specifically, no discrete focal airspace opacities to suggest pneumonia.  Further evaluation with a PA and lateral chest radiograph may be obtained as clinically indicated. Electronically Signed   By: Sandi Mariscal M.D.   On: 02/02/2017 12:48   Mm Digital Screening Bilateral  Result Date: 01/09/2017 CLINICAL DATA:  Screening. History of benign bilateral breast biopsies. EXAM: DIGITAL SCREENING BILATERAL MAMMOGRAM WITH CAD COMPARISON:  Previous exam(s). ACR Breast Density Category b: There are scattered areas of fibroglandular density. FINDINGS: Biopsy site marker within the outer right breast is stable in position. There are no findings suspicious for malignancy within either breast. Images were processed with CAD. IMPRESSION: No mammographic evidence of malignancy. A result letter of this screening mammogram will be mailed directly to the patient. RECOMMENDATION: Screening mammogram in one year. (Code:SM-B-01Y) BI-RADS CATEGORY  2: Benign. Electronically Signed   By: Franki Cabot M.D.   On: 01/09/2017 13:16     Subjective: Patient reports no fevers, chest pain, dyspnea. No chills.  Discharge Exam: Vitals:   02/04/17 0555 02/04/17 0942  BP: 128/62 (!) 96/55  Pulse: 74 84  Resp: 18 14  Temp: 98.4 F (36.9 C) 98.6 F (37 C)   Vitals:   02/03/17 1724 02/03/17 2130 02/04/17 0555 02/04/17 0942  BP: 109/62 118/76 128/62 (!) 96/55  Pulse: (!) 104 72 74 84  Resp: 16 16 18 14   Temp: 98.5 F (36.9 C) 98.6 F (37 C) 98.4 F (36.9 C) 98.6 F (37 C)  TempSrc: Oral Oral Oral Oral  SpO2: 98% 99% 96% 100%  Weight:  (!) 155.7 kg (343 lb 3.2 oz)    Height:        General: Pt is alert, awake, not in acute distress Cardiovascular: RRR, S1/S2 +, no rubs, no gallops Respiratory: CTA bilaterally, no wheezing, no rhonchi Abdominal: Soft, NT, ND, bowel sounds + Extremities: no edema, no cyanosis    The results of significant diagnostics from this hospitalization (including imaging, microbiology, ancillary and laboratory) are listed below for reference.      Microbiology: Recent Results (from the past 240 hour(s))  Blood Culture (routine x 2)     Status: None (Preliminary result)   Collection Time: 02/02/17 11:20 AM  Result Value Ref Range Status   Specimen Description BLOOD RIGHT HAND  Final   Special Requests IN PEDIATRIC BOTTLE 2CC  Final   Culture NO GROWTH < 24 HOURS  Final   Report Status PENDING  Incomplete  Blood Culture (routine x 2)     Status: None (Preliminary result)   Collection Time: 02/02/17  6:36 PM  Result Value Ref Range Status   Specimen Description BLOOD RIGHT ANTECUBITAL  Final   Special Requests BOTTLES DRAWN AEROBIC ONLY 5CC  Final   Culture NO GROWTH < 24 HOURS  Final   Report Status PENDING  Incomplete  MRSA PCR Screening     Status: None   Collection Time: 02/02/17  8:53 PM  Result Value Ref Range Status   MRSA by PCR NEGATIVE NEGATIVE Final    Comment:        The GeneXpert MRSA Assay (FDA approved for  NASAL specimens only), is one component of a comprehensive MRSA colonization surveillance program. It is not intended to diagnose MRSA infection nor to guide or monitor treatment for MRSA infections.      Labs: BNP (last 3 results)  Recent Labs  09/19/16 1215 02/02/17 1836  BNP 171.2* 211.9*   Basic Metabolic Panel:  Recent Labs Lab 02/02/17 1109 02/03/17 0522  NA 136 135  K 4.4 5.0  CL 98* 101  CO2 23 17*  GLUCOSE 134* 123*  BUN 65* 74*  CREATININE 9.38* 10.45*  CALCIUM 9.5 9.1   Liver Function Tests:  Recent Labs Lab 02/02/17 1109  AST 25  ALT 18  ALKPHOS 116  BILITOT 0.3  PROT 8.6*  ALBUMIN 2.9*   No results for input(s): LIPASE, AMYLASE in the last 168 hours. No results for input(s): AMMONIA in the last 168 hours. CBC:  Recent Labs Lab 02/02/17 1109 02/03/17 0522  WBC 17.4* 14.3*  NEUTROABS 15.9*  --   HGB 10.5* 10.3*  HCT 36.0 36.0  MCV 98.9 98.9  PLT 312 287   Cardiac Enzymes: No results for input(s): CKTOTAL, CKMB, CKMBINDEX, TROPONINI in the last  168 hours. BNP: Invalid input(s): POCBNP CBG:  Recent Labs Lab 02/03/17 1449 02/03/17 1727 02/03/17 2111 02/04/17 0804 02/04/17 1203  GLUCAP 142* 265* 121* 100* 166*   D-Dimer No results for input(s): DDIMER in the last 72 hours. Hgb A1c No results for input(s): HGBA1C in the last 72 hours. Lipid Profile No results for input(s): CHOL, HDL, LDLCALC, TRIG, CHOLHDL, LDLDIRECT in the last 72 hours. Thyroid function studies No results for input(s): TSH, T4TOTAL, T3FREE, THYROIDAB in the last 72 hours.  Invalid input(s): FREET3 Anemia work up No results for input(s): VITAMINB12, FOLATE, FERRITIN, TIBC, IRON, RETICCTPCT in the last 72 hours. Urinalysis    Component Value Date/Time   COLORURINE YELLOW 07/18/2013 0831   APPEARANCEUR CLOUDY (A) 07/18/2013 0831   LABSPEC 1.012 07/18/2013 0831   PHURINE 5.0 07/18/2013 0831   GLUCOSEU NEGATIVE 07/18/2013 0831   HGBUR SMALL (A) 07/18/2013 0831   BILIRUBINUR NEGATIVE 07/18/2013 0831   KETONESUR NEGATIVE 07/18/2013 0831   PROTEINUR 30 (A) 07/18/2013 0831   UROBILINOGEN 0.2 07/18/2013 0831   NITRITE NEGATIVE 07/18/2013 0831   LEUKOCYTESUR MODERATE (A) 07/18/2013 0831   Sepsis Labs Invalid input(s): PROCALCITONIN,  WBC,  LACTICIDVEN Microbiology Recent Results (from the past 240 hour(s))  Blood Culture (routine x 2)     Status: None (Preliminary result)   Collection Time: 02/02/17 11:20 AM  Result Value Ref Range Status   Specimen Description BLOOD RIGHT HAND  Final   Special Requests IN PEDIATRIC BOTTLE 2CC  Final   Culture NO GROWTH < 24 HOURS  Final   Report Status PENDING  Incomplete  Blood Culture (routine x 2)     Status: None (Preliminary result)   Collection Time: 02/02/17  6:36 PM  Result Value Ref Range Status   Specimen Description BLOOD RIGHT ANTECUBITAL  Final   Special Requests BOTTLES DRAWN AEROBIC ONLY 5CC  Final   Culture NO GROWTH < 24 HOURS  Final   Report Status PENDING  Incomplete  MRSA PCR Screening      Status: None   Collection Time: 02/02/17  8:53 PM  Result Value Ref Range Status   MRSA by PCR NEGATIVE NEGATIVE Final    Comment:        The GeneXpert MRSA Assay (FDA approved for NASAL specimens only), is one component of a comprehensive MRSA colonization  surveillance program. It is not intended to diagnose MRSA infection nor to guide or monitor treatment for MRSA infections.      Time coordinating discharge: Over 30 minutes  SIGNED:   Cordelia Poche, MD [facility] 02/04/2017, 3:42 PM Pager [phone]  If 7PM-7AM, please contact night-coverage [email or web address] Password [email or web address]

## 2017-02-04 NOTE — Discharge Instructions (Signed)
[name],  You were admitted for infection. Your wounds on your groin were infected. You have been started on IV antibiotics and will continue for a total of 14 days. Please follow-up with cardiology for your heart arrhythmia and dermatology for your hydradenitis suppurativa.

## 2017-02-04 NOTE — Progress Notes (Signed)
Order placed for 30 day monitor for PAF. Our office will call pt to arrange appointment time.

## 2017-02-04 NOTE — Consult Note (Signed)
[facility]  Date of Admission:  02/02/2017  Date of Consult:  02/04/2017  Reason for Consult: Hidradenitis suppurtiva Referring Physician: TRH  Impression/Recommendation Hidradenitis suppurativa Would continue her on vancomycin for 2 weeks total Would change zosyn to ceftaz, also to total 2 weeks.  clinda topical to wounds [facility]   ESRD Appreciate renal f/u  DM2 Her glc are mostly below 150  Morbid Obesity  Comment- [facility]. She needs immunomodulator, not long term anbx.  She is very hesitant to follow this recommendation as she has seen derm before, surgery before and has been deferred from getting immunomodulator previously.   Available as needed.    Bobby Rumpf (pager) [phone] [email or web address]  [name] is an 56 y.o. female.  HPI: 56 yo F with hx of Dm2, CKD, morbid obesity, previous streptococcal bacteremia (September 2017) as well as hidradenitis suppurativa.  She was seen in ID clinic in f/u on 09-2016 and continued on vanco/ceftaz and flagyl.  She had ID f/u on 10-2016 and was changed to bactrim, she developed auditory problems with this and was changed to doxy on 12-12-16.  She was back in ID on 1-30 with continued d/c from her wounds. She had 3 Cx done- 2 showed GPC and GNR on g/s, both grew diphtheroids. The other Cx was no growth.  She was started on doxy and levaquin on 12-29-16.  She was again adm on  3-9 with fever, leukpcytosis and tachycardia. She was started on vanco/zosyn.   She has refused surgical eval.  She was seen by [facility] on 3-10.  I am asked to day to help decide home anbx for pt.   Past Medical History:  Diagnosis Date  . Chronic diastolic CHF (congestive heart failure) ([facility])   . Chronic kidney disease (CKD), stage III (moderate)    [name] nephrology lov note 05-12-2013 on pt chart now on HD  . DCM (dilated cardiomyopathy) ([place])     nonischemic - EF now 50-55% by echo 2014  . Depression   . DM type 2 (diabetes mellitus, type 2) ([facility])    diet controlledwith retinopathy and nephropathy  . GERD (gastroesophageal reflux disease)   . H/O cardiac arrest 10/2012  . Hard of hearing 10/2012   now wears 2 hearing aids  . History of hemodialysis dec 2013  . HTN (hypertension)   . Hydradenitis   . Hyperkalemia   . Hyperlipidemia   . Hypothyroidism   . Iron deficiency anemia   . Morbid obesity ([place])   . Multinodular goiter   . Pneumonia Nov 10, 2012  . Sleep apnea    no test yet per mother 10/22/13   . Tinnitus 12/26/2016  . Urinary tract infection    taking antibiotics for 3 days prior to surgery    Past Surgical History:  Procedure Laterality Date  . AV FISTULA PLACEMENT Left 07/25/2013   Procedure: ARTERIOVENOUS (AV) FISTULA CREATION ;  Surgeon: Rosetta Posner, MD;  Location: [facility];  Service: Vascular;  Laterality: Left;  . CYSTOSCOPY W/ RETROGRADES  11/16/2012   Procedure: CYSTOSCOPY WITH RETROGRADE PYELOGRAM;  Surgeon: Reece Packer, MD;  Location: [facility];  Service: Urology;  Laterality: Bilateral;  CYSTOSCOPY,BILATERAL RETROGRADE PYELOGRAM/ REMOVAL LEFT URETERAL STENT/ FULGERATION BLADDER MUCOSA/ INSERTION RIGHT URETERAL STENT  . CYSTOSCOPY W/ URETERAL STENT PLACEMENT  05/28/2012   Procedure: CYSTOSCOPY WITH RETROGRADE PYELOGRAM/URETERAL STENT PLACEMENT;  Surgeon: Reece Packer,  MD;  Location: [facility];  Service: Urology;  Laterality: Left;  . CYSTOSCOPY WITH RETROGRADE PYELOGRAM, URETEROSCOPY AND STENT PLACEMENT Right 06/23/2013   Procedure: CYSTOSCOPY WITH RIGHT URETEROSCOPY, RIGHT  RETROGRADE PYELOGRAM, WITH LASER LIPOTRIPSY AND RIGHT URETERAL STENT EXCHANGE ;  Surgeon: Molli Hazard, MD;  Location: [facility];  Service: Urology;  Laterality: Right;  STENT EXCHANGE    . CYSTOSCOPY WITH RETROGRADE PYELOGRAM, URETEROSCOPY AND STENT PLACEMENT Right 11/06/2013   Procedure: CYSTOSCOPY WITH RETROGRADE  PYELOGRAM, URETEROSCOPY STONE REMOVAL AND STENT REMOVAL;  Surgeon: Molli Hazard, MD;  Location: [facility];  Service: Urology;  Laterality: Right;  . HOLMIUM LASER APPLICATION N/A 0/24/0973   Procedure: HOLMIUM LASER APPLICATION;  Surgeon: Molli Hazard, MD;  Location: [facility];  Service: Urology;  Laterality: N/A;  . INSERTION OF DIALYSIS CATHETER N/A 09/24/2013   Procedure: INSERTION OF DIALYSIS CATHETER;  Surgeon: Rosetta Posner, MD;  [facility];  Service: Vascular;  Laterality: N/A;  . PORT-A-CATH REMOVAL    . PORTACATH PLACEMENT    . REVISON OF ARTERIOVENOUS FISTULA Left 07/25/2016   Procedure: REVISON OF LEFT ARTERIOVENOUS FISTULA;  Surgeon: Elam Dutch, MD;  Location: [facility];  Service: Vascular;  Laterality: Left;     Allergies  Allergen Reactions  . Amoxicillin Rash and Other (See Comments)    Tolerated Zosyn 01/2013. [place]  . Amoxicillin-Pot Clavulanate Diarrhea    Medications:  Scheduled: . aspirin EC  81 mg Oral QHS  . [START ON 02/05/2017] calcitRIOL  1.5 mcg Oral Q M,W,F-HD  . calcium acetate  667 mg Oral TID WC  . cinacalcet  60 mg Oral Q M,W,F-1800  . clindamycin  1 application Topical BID  . DULoxetine  60 mg Oral Daily  . famotidine  20 mg Oral QHS  . feeding supplement  2 Container Oral BID BM  . fluticasone  2 spray Each Nare Daily  . heparin  5,000 Units Subcutaneous Q8H  . insulin aspart  0-5 Units Subcutaneous QHS  . insulin aspart  0-9 Units Subcutaneous TID WC  . levothyroxine  75 mcg Oral QAC breakfast  . mouth rinse  15 mL Mouth Rinse BID  . [START ON 02/05/2017] midodrine  10 mg Oral Q M,W,F-HD  . multivitamin  1 tablet Oral QHS  . piperacillin-tazobactam (ZOSYN)  IV  3.375 g Intravenous Q12H  . pravastatin  40 mg Oral Daily  . sevelamer carbonate  2,400 mg Oral TID WC  . sodium chloride flush  3 mL Intravenous Q12H  . sodium chloride flush  3 mL Intravenous Q12H    Abtx:  Anti-infectives    Start     Dose/Rate Route  Frequency Ordered Stop   02/03/17 1200  vancomycin (VANCOCIN) IVPB 1000 mg/200 mL premix     1,000 mg 200 mL/hr over 60 Minutes Intravenous Every Sat (Hemodialysis) 02/03/17 0930 02/03/17 1407   02/02/17 1130  vancomycin (VANCOCIN) 2,500 mg in sodium chloride 0.9 % 500 mL IVPB     2,500 mg 250 mL/hr over 120 Minutes Intravenous  Once 02/02/17 1126 02/02/17 1510   02/02/17 1130  piperacillin-tazobactam (ZOSYN) IVPB 3.375 g     3.375 g 12.5 mL/hr over 240 Minutes Intravenous Every 12 hours 02/02/17 1126     02/02/17 1115  piperacillin-tazobactam (ZOSYN) IVPB 3.375 g  Status:  Discontinued     3.375 g 100 mL/hr over 30 Minutes Intravenous  Once 02/02/17 1109 02/02/17 1127   02/02/17 1115  vancomycin (VANCOCIN) IVPB 1000 mg/200 mL  premix  Status:  Discontinued     1,000 mg 200 mL/hr over 60 Minutes Intravenous  Once 02/02/17 1109 02/02/17 1126      Total days of antibiotics: 2 vanco/zosyn          Social History:  reports that she quit smoking about 37 years ago. Her smoking use included Cigarettes. She has a 0.25 pack-year smoking history. She has never used smokeless tobacco. She reports that she does not drink alcohol or use drugs.  Family History  Problem Relation Age of Onset  . Coronary artery disease Mother   . Hypertension Mother   . Diabetes type II Mother   . Coronary artery disease Father   . Hypertension Father   . Malignant hyperthermia Father   . Cancer Maternal Grandfather     ? Type  . Kidney failure Maternal Grandmother     General ROS: eats irregularly. no bm, makes scant urine. DOE, occas wakes up at night SOB. see HPI.  Please see HPI. 12 point ROS o/w (-)  Blood pressure (!) 96/55, pulse 84, temperature 98.6 F (37 C), temperature source Oral, resp. rate 14, height 5' 6.5" (1.689 m), weight (!) 155.7 kg (343 lb 3.2 oz), last menstrual period 06/13/2013, SpO2 100 %. General appearance: alert, cooperative and no distress Eyes: negative findings: conjunctivae  and sclerae normal and pupils equal, round, reactive to light and accomodation Throat: normal findings: oropharynx pink & moist without lesions or evidence of thrush Neck: no adenopathy and supple, symmetrical, trachea midline Lungs: clear to auscultation bilaterally Heart: regular rate and rhythm Abdomen: normal findings: bowel sounds normal and soft, non-tender Extremities: edema trace and LUE AVF +bruit.  Skin: she refuses exam of her skin lesions.    Results for orders placed or performed during the hospital encounter of 02/02/17 (from the past 48 hour(s))  Blood Culture (routine x 2)     Status: None (Preliminary result)   Collection Time: 02/02/17  6:36 PM  Result Value Ref Range   Specimen Description BLOOD RIGHT ANTECUBITAL    Special Requests BOTTLES DRAWN AEROBIC ONLY 5CC    Culture NO GROWTH < 24 HOURS    Report Status PENDING   Brain natriuretic peptide     Status: Abnormal   Collection Time: 02/02/17  6:36 PM  Result Value Ref Range   B Natriuretic Peptide 493.1 (H) 0.0 - 100.0 pg/mL  HIV antibody (Routine Testing)     Status: None   Collection Time: 02/02/17  6:36 PM  Result Value Ref Range   HIV Screen 4th Generation wRfx Non Reactive Non Reactive    Comment: (NOTE) Performed At: [facility] [address] [place], [place] [id number] Mila Homer MD [id number]   MRSA PCR Screening     Status: None   Collection Time: 02/02/17  8:53 PM  Result Value Ref Range   MRSA by PCR NEGATIVE NEGATIVE    Comment:        The GeneXpert MRSA Assay (FDA approved for NASAL specimens only), is one component of a comprehensive MRSA colonization surveillance program. It is not intended to diagnose MRSA infection nor to guide or monitor treatment for MRSA infections.   Glucose, capillary     Status: Abnormal   Collection Time: 02/02/17  9:25 PM  Result Value Ref Range   Glucose-Capillary 118 (H) 65 - 99 mg/dL  Basic metabolic panel     Status: Abnormal    Collection Time: 02/03/17  5:22 AM  Result Value Ref  Range   Sodium 135 135 - 145 mmol/L   Potassium 5.0 3.5 - 5.1 mmol/L   Chloride 101 101 - 111 mmol/L   CO2 17 (L) 22 - 32 mmol/L   Glucose, Bld 123 (H) 65 - 99 mg/dL   BUN 74 (H) 6 - 20 mg/dL   Creatinine, Ser 27.82 (H) 0.44 - 1.00 mg/dL   Calcium 9.1 8.9 - 96.0 mg/dL   GFR calc non Af Amer 4 (L) >60 mL/min   GFR calc Af Amer 4 (L) >60 mL/min    Comment: (NOTE) The eGFR has been calculated using the CKD EPI equation. This calculation has not been validated in all clinical situations. eGFR's persistently <60 mL/min signify possible Chronic Kidney Disease.    Anion gap 17 (H) 5 - 15  CBC     Status: Abnormal   Collection Time: 02/03/17  5:22 AM  Result Value Ref Range   WBC 14.3 (H) 4.0 - 10.5 K/uL   RBC 3.64 (L) 3.87 - 5.11 MIL/uL   Hemoglobin 10.3 (L) 12.0 - 15.0 g/dL   HCT 39.0 56.4 - 69.8 %   MCV 98.9 78.0 - 100.0 fL   MCH 28.3 26.0 - 34.0 pg   MCHC 28.6 (L) 30.0 - 36.0 g/dL   RDW 06.0 (H) 78.9 - 50.1 %   Platelets 287 150 - 400 K/uL  Glucose, capillary     Status: Abnormal   Collection Time: 02/03/17  7:45 AM  Result Value Ref Range   Glucose-Capillary 119 (H) 65 - 99 mg/dL  Glucose, capillary     Status: Abnormal   Collection Time: 02/03/17  2:49 PM  Result Value Ref Range   Glucose-Capillary 142 (H) 65 - 99 mg/dL  Glucose, capillary     Status: Abnormal   Collection Time: 02/03/17  5:27 PM  Result Value Ref Range   Glucose-Capillary 265 (H) 65 - 99 mg/dL  Glucose, capillary     Status: Abnormal   Collection Time: 02/03/17  9:11 PM  Result Value Ref Range   Glucose-Capillary 121 (H) 65 - 99 mg/dL  Glucose, capillary     Status: Abnormal   Collection Time: 02/04/17  8:04 AM  Result Value Ref Range   Glucose-Capillary 100 (H) 65 - 99 mg/dL  Glucose, capillary     Status: Abnormal   Collection Time: 02/04/17 12:03 PM  Result Value Ref Range   Glucose-Capillary 166 (H) 65 - 99 mg/dL      Component Value  Date/Time   SDES BLOOD RIGHT ANTECUBITAL 02/02/2017 1836   SPECREQUEST BOTTLES DRAWN AEROBIC ONLY 5CC 02/02/2017 1836   CULT NO GROWTH < 24 HOURS 02/02/2017 1836   REPTSTATUS PENDING 02/02/2017 1836   No results found. Recent Results (from the past 240 hour(s))  Blood Culture (routine x 2)     Status: None (Preliminary result)   Collection Time: 02/02/17 11:20 AM  Result Value Ref Range Status   Specimen Description BLOOD RIGHT HAND  Final   Special Requests IN PEDIATRIC BOTTLE 2CC  Final   Culture NO GROWTH < 24 HOURS  Final   Report Status PENDING  Incomplete  Blood Culture (routine x 2)     Status: None (Preliminary result)   Collection Time: 02/02/17  6:36 PM  Result Value Ref Range Status   Specimen Description BLOOD RIGHT ANTECUBITAL  Final   Special Requests BOTTLES DRAWN AEROBIC ONLY 5CC  Final   Culture NO GROWTH < 24 HOURS  Final   Report Status PENDING  Incomplete  MRSA PCR Screening     Status: None   Collection Time: 02/02/17  8:53 PM  Result Value Ref Range Status   MRSA by PCR NEGATIVE NEGATIVE Final    Comment:        The GeneXpert MRSA Assay (FDA approved for NASAL specimens only), is one component of a comprehensive MRSA colonization surveillance program. It is not intended to diagnose MRSA infection nor to guide or monitor treatment for MRSA infections.       02/04/2017, 2:31 PM     LOS: 2 days    Records and images were personally reviewed where available.

## 2017-02-04 NOTE — Progress Notes (Signed)
[facility] Progress Note   Subjective: no c/o  Vitals:   02/03/17 1724 02/03/17 2130 02/04/17 0555 02/04/17 0942  BP: 109/62 118/76 128/62 (!) 96/55  Pulse: (!) 104 72 74 84  Resp: 16 16 18 14   Temp: 98.5 F (36.9 C) 98.6 F (37 C) 98.4 F (36.9 C) 98.6 F (37 C)  TempSrc: Oral Oral Oral Oral  SpO2: 98% 99% 96% 100%  Weight:  (!) 155.7 kg (343 lb 3.2 oz)    Height:        Inpatient medications: . aspirin EC  81 mg Oral QHS  . [START ON 02/05/2017] calcitRIOL  1.5 mcg Oral Q M,W,F-HD  . calcium acetate  667 mg Oral TID WC  . cinacalcet  60 mg Oral Q M,W,F-1800  . clindamycin  1 application Topical BID  . DULoxetine  60 mg Oral Daily  . famotidine  20 mg Oral QHS  . feeding supplement  2 Container Oral BID BM  . fluticasone  2 spray Each Nare Daily  . heparin  5,000 Units Subcutaneous Q8H  . insulin aspart  0-5 Units Subcutaneous QHS  . insulin aspart  0-9 Units Subcutaneous TID WC  . levothyroxine  75 mcg Oral QAC breakfast  . mouth rinse  15 mL Mouth Rinse BID  . [START ON 02/05/2017] midodrine  10 mg Oral Q M,W,F-HD  . multivitamin  1 tablet Oral QHS  . piperacillin-tazobactam (ZOSYN)  IV  3.375 g Intravenous Q12H  . pravastatin  40 mg Oral Daily  . sevelamer carbonate  2,400 mg Oral TID WC  . sodium chloride flush  3 mL Intravenous Q12H  . sodium chloride flush  3 mL Intravenous Q12H    sodium chloride, acetaminophen **OR** acetaminophen, oxyCODONE-acetaminophen, sodium chloride flush  Exam: Obese AAF, no distress, on HD No jvd Chest clear bilat to bases RRR no mrg Abd marked obesity, nontender, +bs Skin - skin thickening in gluteal folds, perineum and groin areas.  Draining lesion R upper thigh/ groin area. L arm AVF+bruit NF, ox3  Dialysis: [facility] MWF 4.5h   154kg   2/2.5 bath  F200  500/800   Hep 5000 + [id number]    L arm AVF - Mircera 225 mcg q 2 weeks (last dose 3/7) - Calcitriol 1.50 mcg q HD  - Sensipar 60 mg po q HD        Assessment: 1. Fever/ flare-up of hidradenitis suppurativa - on IV zosyn, feeling better, temps coming down 2. ESRD HD MWF 3. Volume - is up 2kg 4. Chron resp failure - on home O2 5. OSA 6. MOrbid obesity 7. DM2 - on SSI 8. Hidradenitis suppurativa - is on chronic OP abx per ID 9. Anemia of CKD - last esa on 3/7, Hb 10.3  10. 2HPTH - cont vdra/ sensipar 11. Dispo - possible dc soon per pt; stable from renal standpoint  Plan - HD Monday if still here   Kelly Splinter MD [place] pager [phone]   02/04/2017, 12:07 PM    Recent Labs Lab 02/02/17 1109 02/03/17 0522  NA 136 135  K 4.4 5.0  CL 98* 101  CO2 23 17*  GLUCOSE 134* 123*  BUN 65* 74*  CREATININE 9.38* 10.45*  CALCIUM 9.5 9.1    Recent Labs Lab 02/02/17 1109  AST 25  ALT 18  ALKPHOS 116  BILITOT 0.3  PROT 8.6*  ALBUMIN 2.9*    Recent Labs Lab 02/02/17 1109 02/03/17 0522  WBC 17.4* 14.3*  NEUTROABS 15.9*  --   HGB 10.5* 10.3*  HCT 36.0 36.0  MCV 98.9 98.9  PLT 312 287   Iron/TIBC/Ferritin/ %Sat    Component Value Date/Time   IRON 34 (L) 12/22/2014 0756   TIBC 144 (L) 12/22/2014 0756   FERRITIN 780 (H) 12/22/2014 0756   IRONPCTSAT 24 12/22/2014 0756   IRONPCTSAT 10 (L) 06/01/2008 1547

## 2017-02-04 NOTE — Consult Note (Signed)
CONSULTATION NOTE  Reason for Consult: Tachycardia, ?MAT, PVC's, diastolic CHF   Requesting Physician: Dr. Lonny Prude   Cardiologist: Dr. Radford Pax   HPI: This is a 56 y.o. female with a past medical history significant for DCM with EF that normalized by echo in 08/2016 to 50-55%, obesity, chronic diastolic CHF, HTN, DM2 and prior cardiac arrest in 2013 along with ESRD on HD. She presents with fever up to 102.19F along with associated dizziness and disorientation. Source of infection was not immediately known, however she is on dialysis. While in the hospital she's had intermittent tachycardia which has responded to diltiazem and there is concern for possible multifocal atrial tachycardia. In addition she's had some frequent PVCs. EKG performed yesterday does look somewhat like atrial fibrillation with rapid ventricular response, however subsequent EKG shows clear P waves with frequent PVCs. Currently she is in sinus rhythm with PVCs on the monitor.  PMHx:  Past Medical History:  Diagnosis Date  . Chronic diastolic CHF (congestive heart failure) ([place])   . Chronic kidney disease (CKD), stage III (moderate)    [name] nephrology lov note 05-12-2013 on pt chart now on HD  . DCM (dilated cardiomyopathy) ([place])    nonischemic - EF now 50-55% by echo 2014  . Depression   . DM type 2 (diabetes mellitus, type 2) (HCC)    diet controlledwith retinopathy and nephropathy  . GERD (gastroesophageal reflux disease)   . H/O cardiac arrest 10/2012  . Hard of hearing 10/2012   now wears 2 hearing aids  . History of hemodialysis dec 2013  . HTN (hypertension)   . Hydradenitis   . Hyperkalemia   . Hyperlipidemia   . Hypothyroidism   . Iron deficiency anemia   . Morbid obesity ([place])   . Multinodular goiter   . Pneumonia Nov 10, 2012  . Sleep apnea    no test yet per mother 10/22/13   . Tinnitus 12/26/2016  . Urinary tract infection    taking antibiotics for 3 days prior to surgery   Past  Surgical History:  Procedure Laterality Date  . AV FISTULA PLACEMENT Left 07/25/2013   Procedure: ARTERIOVENOUS (AV) FISTULA CREATION ;  Surgeon: Rosetta Posner, MD;  Location: [facility];  Service: Vascular;  Laterality: Left;  . CYSTOSCOPY W/ RETROGRADES  11/16/2012   Procedure: CYSTOSCOPY WITH RETROGRADE PYELOGRAM;  Surgeon: Reece Packer, MD;  Location: [facility];  Service: Urology;  Laterality: Bilateral;  CYSTOSCOPY,BILATERAL RETROGRADE PYELOGRAM/ REMOVAL LEFT URETERAL STENT/ FULGERATION BLADDER MUCOSA/ INSERTION RIGHT URETERAL STENT  . CYSTOSCOPY W/ URETERAL STENT PLACEMENT  05/28/2012   Procedure: CYSTOSCOPY WITH RETROGRADE PYELOGRAM/URETERAL STENT PLACEMENT;  Surgeon: Reece Packer, MD;  Location: [facility];  Service: Urology;  Laterality: Left;  . CYSTOSCOPY WITH RETROGRADE PYELOGRAM, URETEROSCOPY AND STENT PLACEMENT Right 06/23/2013   Procedure: CYSTOSCOPY WITH RIGHT URETEROSCOPY, RIGHT  RETROGRADE PYELOGRAM, WITH LASER LIPOTRIPSY AND RIGHT URETERAL STENT EXCHANGE ;  Surgeon: Molli Hazard, MD;  Location: [facility];  Service: Urology;  Laterality: Right;  STENT EXCHANGE    . CYSTOSCOPY WITH RETROGRADE PYELOGRAM, URETEROSCOPY AND STENT PLACEMENT Right 11/06/2013   Procedure: CYSTOSCOPY WITH RETROGRADE PYELOGRAM, URETEROSCOPY STONE REMOVAL AND STENT REMOVAL;  Surgeon: Molli Hazard, MD;  Location: [facility];  Service: Urology;  Laterality: Right;  . HOLMIUM LASER APPLICATION N/A 9/47/0962   Procedure: HOLMIUM LASER APPLICATION;  Surgeon: Molli Hazard, MD;  Location: [facility];  Service: Urology;  Laterality: N/A;  . INSERTION OF DIALYSIS CATHETER N/A 09/24/2013  Procedure: INSERTION OF DIALYSIS CATHETER;  Surgeon: Rosetta Posner, MD;  Location: [place];  Service: Vascular;  Laterality: N/A;  . PORT-A-CATH REMOVAL    . PORTACATH PLACEMENT    . REVISON OF ARTERIOVENOUS FISTULA Left 07/25/2016   Procedure: REVISON OF LEFT ARTERIOVENOUS FISTULA;  Surgeon: Elam Dutch, MD;   Location: [facility];  Service: Vascular;  Laterality: Left;    FAMHx: Family History  Problem Relation Age of Onset  . Coronary artery disease Mother   . Hypertension Mother   . Diabetes type II Mother   . Coronary artery disease Father   . Hypertension Father   . Malignant hyperthermia Father   . Cancer Maternal Grandfather     ? Type  . Kidney failure Maternal Grandmother     SOCHx:  reports that she quit smoking about 37 years ago. Her smoking use included Cigarettes. She has a 0.25 pack-year smoking history. She has never used smokeless tobacco. She reports that she does not drink alcohol or use drugs.  ALLERGIES: Allergies  Allergen Reactions  . Amoxicillin Rash and Other (See Comments)    Tolerated Zosyn 01/2013. [place]  . Amoxicillin-Pot Clavulanate Diarrhea    ROS: Pertinent items noted in HPI and remainder of comprehensive ROS otherwise negative.  HOME MEDICATIONS: No current facility-administered medications on file prior to encounter.    Current Outpatient Prescriptions on File Prior to Encounter  Medication Sig Dispense Refill  . aspirin EC 81 MG tablet Take 81 mg by mouth at bedtime.     . Azelastine-Fluticasone 137-50 MCG/ACT SUSP Place 2 sprays into the nose daily. 1 Bottle 3  . Calcium Acetate 667 MG TABS Take 667 mg by mouth 3 (three) times daily with meals. And snacks    . clindamycin (CLEOCIN T) 1 % external solution Apply 1 application topically 2 (two) times daily.     Marland Kitchen doxycycline (VIBRAMYCIN) 100 MG capsule Take 1 capsule (100 mg total) by mouth 2 (two) times daily. 42 capsule 1  . DULoxetine (CYMBALTA) 60 MG capsule Take 1 capsule (60 mg total) by mouth daily. 30 capsule 0  . famotidine (PEPCID) 20 MG tablet TAKE 1 TABLET BY MOUTH AT BEDTIME (Patient taking differently: Take 20 mg by mouth at bedtime) 30 tablet 0  . feeding supplement, RESOURCE BREEZE, (RESOURCE BREEZE) LIQD Take 2 Containers by mouth 2 (two) times daily between meals.     Marland Kitchen  glipiZIDE (GLUCOTROL) 5 MG tablet Take 0.5 tablets (2.5 mg total) by mouth daily. 30 tablet 0  . levofloxacin (LEVAQUIN) 500 MG tablet Take 1 tablet (500 mg total) by mouth every other day. 1st dose day after the 728m dose 10 tablet 1  . levothyroxine (SYNTHROID, LEVOTHROID) 75 MCG tablet Take 75 mcg by mouth daily.  0  . midodrine (PROAMATINE) 10 MG tablet Take 1 tablet (10 mg total) by mouth 3 (three) times daily. (Patient taking differently: Take 10 mg by mouth every Monday, Wednesday, and Friday. Dialysis days) 90 tablet 0  . multivitamin (RENA-VIT) TABS tablet Take 1 tablet by mouth daily.  6  . oxyCODONE-acetaminophen (PERCOCET/ROXICET) 5-325 MG tablet Take 1-2 tablets by mouth every 6 (six) hours as needed. Patient has not taken since started dialysis 08/2013 due to causes hypotension (Patient taking differently: Take 1-2 tablets by mouth every 6 (six) hours as needed for moderate pain. ) 6 tablet 0  . pravastatin (PRAVACHOL) 40 MG tablet Take 40 mg by mouth daily. Patient takes in am    .  Probiotic Product (PROBIOTIC DAILY PO) Take 1 tablet by mouth daily.     . sucroferric oxyhydroxide (VELPHORO) 500 MG chewable tablet Chew 500 mg by mouth 3 (three) times daily with meals.      HOSPITAL MEDICATIONS: Prior to Admission:  Prescriptions Prior to Admission  Medication Sig Dispense Refill Last Dose  . aspirin EC 81 MG tablet Take 81 mg by mouth at bedtime.    02/01/2017 at Unknown time  . Azelastine-Fluticasone 137-50 MCG/ACT SUSP Place 2 sprays into the nose daily. 1 Bottle 3 02/01/2017 at Unknown time  . Calcium Acetate 667 MG TABS Take 667 mg by mouth 3 (three) times daily with meals. And snacks   02/02/2017 at Unknown time  . clindamycin (CLEOCIN T) 1 % external solution Apply 1 application topically 2 (two) times daily.    02/02/2017 at Unknown time  . doxycycline (VIBRAMYCIN) 100 MG capsule Take 1 capsule (100 mg total) by mouth 2 (two) times daily. 42 capsule 1 02/01/2017 at Unknown time  .  DULoxetine (CYMBALTA) 60 MG capsule Take 1 capsule (60 mg total) by mouth daily. 30 capsule 0 02/02/2017 at Unknown time  . famotidine (PEPCID) 20 MG tablet TAKE 1 TABLET BY MOUTH AT BEDTIME (Patient taking differently: Take 20 mg by mouth at bedtime) 30 tablet 0 02/01/2017 at Unknown time  . feeding supplement, RESOURCE BREEZE, (RESOURCE BREEZE) LIQD Take 2 Containers by mouth 2 (two) times daily between meals.    02/01/2017 at Unknown time  . glipiZIDE (GLUCOTROL) 5 MG tablet Take 0.5 tablets (2.5 mg total) by mouth daily. 30 tablet 0 02/02/2017 at Unknown time  . levofloxacin (LEVAQUIN) 500 MG tablet Take 1 tablet (500 mg total) by mouth every other day. 1st dose day after the 713m dose 10 tablet 1 02/01/2017 at Unknown time  . levothyroxine (SYNTHROID, LEVOTHROID) 75 MCG tablet Take 75 mcg by mouth daily.  0 02/02/2017 at Unknown time  . midodrine (PROAMATINE) 10 MG tablet Take 1 tablet (10 mg total) by mouth 3 (three) times daily. (Patient taking differently: Take 10 mg by mouth every Monday, Wednesday, and Friday. Dialysis days) 90 tablet 0 01/31/2017  . multivitamin (RENA-VIT) TABS tablet Take 1 tablet by mouth daily.  6 02/02/2017 at Unknown time  . oxyCODONE-acetaminophen (PERCOCET/ROXICET) 5-325 MG tablet Take 1-2 tablets by mouth every 6 (six) hours as needed. Patient has not taken since started dialysis 08/2013 due to causes hypotension (Patient taking differently: Take 1-2 tablets by mouth every 6 (six) hours as needed for moderate pain. ) 6 tablet 0 02/02/2017 at Unknown time  . pravastatin (PRAVACHOL) 40 MG tablet Take 40 mg by mouth daily. Patient takes in am   02/02/2017 at Unknown time  . Probiotic Product (PROBIOTIC DAILY PO) Take 1 tablet by mouth daily.    02/02/2017 at Unknown time  . sevelamer carbonate (RENVELA) 800 MG tablet Take 2,400 mg by mouth 3 (three) times daily with meals.   02/02/2017 at Unknown time  . sucroferric oxyhydroxide (VELPHORO) 500 MG chewable tablet Chew 500 mg by mouth 3 (three)  times daily with meals.   02/02/2017 at Unknown time    VITALS: Blood pressure (!) 96/55, pulse 84, temperature 98.6 F (37 C), temperature source Oral, resp. rate 14, height 5' 6.5" (1.689 m), weight (!) 343 lb 3.2 oz (155.7 kg), last menstrual period 06/13/2013, SpO2 100 %.  PHYSICAL EXAM: General appearance: alert, no distress and moderately obese Neck: no carotid bruit and no JVD Lungs: clear to auscultation bilaterally  Heart: regular rate and rhythm and Occasional skipped beats Abdomen: soft, non-tender; bowel sounds normal; no masses,  no organomegaly Extremities: extremities normal, atraumatic, no cyanosis or edema Pulses: 2+ and symmetric Skin: Diffuse dark hyperpigmentation of the skin Neurologic: Grossly normal Psych: Pleasant  LABS: Results for orders placed or performed during the hospital encounter of 02/02/17 (from the past 48 hour(s))  Blood Culture (routine x 2)     Status: None (Preliminary result)   Collection Time: 02/02/17  6:36 PM  Result Value Ref Range   Specimen Description BLOOD RIGHT ANTECUBITAL    Special Requests BOTTLES DRAWN AEROBIC ONLY 5CC    Culture NO GROWTH < 24 HOURS    Report Status PENDING   Brain natriuretic peptide     Status: Abnormal   Collection Time: 02/02/17  6:36 PM  Result Value Ref Range   B Natriuretic Peptide 493.1 (H) 0.0 - 100.0 pg/mL  HIV antibody (Routine Testing)     Status: None   Collection Time: 02/02/17  6:36 PM  Result Value Ref Range   HIV Screen 4th Generation wRfx Non Reactive Non Reactive    Comment: (NOTE) Performed At: [facility] [place], [place] [id number] Lindon Romp MD [id number]   MRSA PCR Screening     Status: None   Collection Time: 02/02/17  8:53 PM  Result Value Ref Range   MRSA by PCR NEGATIVE NEGATIVE    Comment:        The GeneXpert MRSA Assay (FDA approved for NASAL specimens only), is one component of a comprehensive MRSA colonization surveillance program. It  is not intended to diagnose MRSA infection nor to guide or monitor treatment for MRSA infections.   Glucose, capillary     Status: Abnormal   Collection Time: 02/02/17  9:25 PM  Result Value Ref Range   Glucose-Capillary 118 (H) 65 - 99 mg/dL  Basic metabolic panel     Status: Abnormal   Collection Time: 02/03/17  5:22 AM  Result Value Ref Range   Sodium 135 135 - 145 mmol/L   Potassium 5.0 3.5 - 5.1 mmol/L   Chloride 101 101 - 111 mmol/L   CO2 17 (L) 22 - 32 mmol/L   Glucose, Bld 123 (H) 65 - 99 mg/dL   BUN 74 (H) 6 - 20 mg/dL   Creatinine, Ser 10.45 (H) 0.44 - 1.00 mg/dL   Calcium 9.1 8.9 - 10.3 mg/dL   GFR calc non Af Amer 4 (L) >60 mL/min   GFR calc Af Amer 4 (L) >60 mL/min    Comment: (NOTE) The eGFR has been calculated using the CKD EPI equation. This calculation has not been validated in all clinical situations. eGFR's persistently <60 mL/min signify possible Chronic Kidney Disease.    Anion gap 17 (H) 5 - 15  CBC     Status: Abnormal   Collection Time: 02/03/17  5:22 AM  Result Value Ref Range   WBC 14.3 (H) 4.0 - 10.5 K/uL   RBC 3.64 (L) 3.87 - 5.11 MIL/uL   Hemoglobin 10.3 (L) 12.0 - 15.0 g/dL   HCT 36.0 36.0 - 46.0 %   MCV 98.9 78.0 - 100.0 fL   MCH 28.3 26.0 - 34.0 pg   MCHC 28.6 (L) 30.0 - 36.0 g/dL   RDW 17.9 (H) 11.5 - 15.5 %   Platelets 287 150 - 400 K/uL  Glucose, capillary     Status: Abnormal   Collection Time: 02/03/17  7:45 AM  Result  Value Ref Range   Glucose-Capillary 119 (H) 65 - 99 mg/dL  Glucose, capillary     Status: Abnormal   Collection Time: 02/03/17  2:49 PM  Result Value Ref Range   Glucose-Capillary 142 (H) 65 - 99 mg/dL  Glucose, capillary     Status: Abnormal   Collection Time: 02/03/17  5:27 PM  Result Value Ref Range   Glucose-Capillary 265 (H) 65 - 99 mg/dL  Glucose, capillary     Status: Abnormal   Collection Time: 02/03/17  9:11 PM  Result Value Ref Range   Glucose-Capillary 121 (H) 65 - 99 mg/dL  Glucose, capillary      Status: Abnormal   Collection Time: 02/04/17  8:04 AM  Result Value Ref Range   Glucose-Capillary 100 (H) 65 - 99 mg/dL  Glucose, capillary     Status: Abnormal   Collection Time: 02/04/17 12:03 PM  Result Value Ref Range   Glucose-Capillary 166 (H) 65 - 99 mg/dL    IMAGING: No results found.  HOSPITAL DIAGNOSES: Principal Problem:   SIRS (systemic inflammatory response syndrome) (HCC) Active Problems:   Morbid obesity ([place])   DM type 2 (diabetes mellitus, type 2) (HCC)   Chronic kidney disease (CKD), stage V (HCC)   Hidradenitis suppurativa   Hypertension   SOB (shortness of breath)   Chronic diastolic CHF (congestive heart failure) (HCC)   OSA (obstructive sleep apnea)   Chronic respiratory failure (H[facility])   ESRD (end stage renal disease) on dialysis (HCC)   PAF (paroxysmal atrial fibrillation) ([place])   IMPRESSION: 1. PAF - CHADSVASC score of 4 2. PVCs 3. History of dilated cardiomyopathy with EF 50-55% in October 2017 4. ESRD on HD 5. FUO  RECOMMENDATION: 1. Mrs. Deakins presented with fever of unknown origin but is feeling better. During that she was noted to be in A. fib with RVR briefly which responded to diltiazem. She now is in sinus rhythm with some more frequent PVCs. Her CHADSVASC score is 4, however, it is not clear if the a-fib was related to acute fever/infection. She is not on b-blocker, presumably due to hypotension at dialysis (she is ordered for midodrine on dialysis days). Previously she was Bystolic 5 mg daily. Would attempt to start low dose carvedilol 3.125 mg BID (for cardiomyopathy, PAF and PVC's). Recommend 30 day outpatient event monitor at discharge. If there is recurrent a-fib, would recommend starting warfarin anticoagulation.  Follow-up with Dr. Radford Pax or APP after discharge. We will arrange for the monitor.  Thanks for the consultation.  Time Spent Directly with Patient: 35 minutes  Pixie Casino, MD, [facility] Attending Cardiologist [facility] 02/04/2017, 2:51 PM

## 2017-02-05 ENCOUNTER — Telehealth: Payer: Self-pay | Admitting: Cardiology

## 2017-02-05 DIAGNOSIS — D509 Iron deficiency anemia, unspecified: Secondary | ICD-10-CM | POA: Diagnosis not present

## 2017-02-05 DIAGNOSIS — L732 Hidradenitis suppurativa: Secondary | ICD-10-CM | POA: Diagnosis not present

## 2017-02-05 DIAGNOSIS — D631 Anemia in chronic kidney disease: Secondary | ICD-10-CM | POA: Diagnosis not present

## 2017-02-05 DIAGNOSIS — E1129 Type 2 diabetes mellitus with other diabetic kidney complication: Secondary | ICD-10-CM | POA: Diagnosis not present

## 2017-02-05 DIAGNOSIS — N186 End stage renal disease: Secondary | ICD-10-CM | POA: Diagnosis not present

## 2017-02-05 DIAGNOSIS — N2581 Secondary hyperparathyroidism of renal origin: Secondary | ICD-10-CM | POA: Diagnosis not present

## 2017-02-05 NOTE — Telephone Encounter (Signed)
She does not have a DCM so would stop the Coreg.  I am concerned about PAF but likely this was due to recent illness.

## 2017-02-05 NOTE — Telephone Encounter (Signed)
New Message:    Pt was hospitalized over the weekend,she was prescribed Coreg.Pt is also taking Midodrine,she takes that 3 times a week when she goes to dialysis.She is concerned because she is taking both of the medicine.

## 2017-02-05 NOTE — Telephone Encounter (Signed)
Patient's mother called concerned about the Coreg the patient was prescribed in the hospital over the weekend. She takes midodrine three times weekly with dialysis for hypotension and she is nervous to administer the Coreg. She states she only had one dose last night and the patient was a little dizzy. This morning she was lethargic.  She is currently at dialysis now and did not take the Coreg this AM. Per Dr. Lysbeth Penner consult note: "Would attempt to start low dose carvedilol 3.125 mg BID (for cardiomyopathy, PAF and PVC's)." She states she does not want to take the medication until she hears back from Cardiology with Dr. Theodosia Blender recommendations.

## 2017-02-06 ENCOUNTER — Ambulatory Visit: Payer: Medicare Other | Admitting: Infectious Disease

## 2017-02-06 NOTE — Telephone Encounter (Signed)
Instructed DPR to [name]. She understands to call if patient c/o palpitations, fluttering, racing heart, CP, SOB. She states she will talk to the patient and call back later to schedule a follow-up appointment.

## 2017-02-06 NOTE — Addendum Note (Signed)
Addended by: Harland German A on: 02/06/2017 09:10 AM   Modules accepted: Orders

## 2017-02-07 DIAGNOSIS — N2581 Secondary hyperparathyroidism of renal origin: Secondary | ICD-10-CM | POA: Diagnosis not present

## 2017-02-07 DIAGNOSIS — D509 Iron deficiency anemia, unspecified: Secondary | ICD-10-CM | POA: Diagnosis not present

## 2017-02-07 DIAGNOSIS — L732 Hidradenitis suppurativa: Secondary | ICD-10-CM | POA: Diagnosis not present

## 2017-02-07 DIAGNOSIS — N186 End stage renal disease: Secondary | ICD-10-CM | POA: Diagnosis not present

## 2017-02-07 DIAGNOSIS — E1129 Type 2 diabetes mellitus with other diabetic kidney complication: Secondary | ICD-10-CM | POA: Diagnosis not present

## 2017-02-07 DIAGNOSIS — D631 Anemia in chronic kidney disease: Secondary | ICD-10-CM | POA: Diagnosis not present

## 2017-02-07 LAB — CULTURE, BLOOD (ROUTINE X 2)
CULTURE: NO GROWTH
Culture: NO GROWTH

## 2017-02-08 ENCOUNTER — Ambulatory Visit (INDEPENDENT_AMBULATORY_CARE_PROVIDER_SITE_OTHER): Payer: Medicare Other | Admitting: Infectious Disease

## 2017-02-08 ENCOUNTER — Encounter: Payer: Self-pay | Admitting: Podiatry

## 2017-02-08 ENCOUNTER — Ambulatory Visit (INDEPENDENT_AMBULATORY_CARE_PROVIDER_SITE_OTHER): Payer: Medicare Other | Admitting: Podiatry

## 2017-02-08 ENCOUNTER — Telehealth: Payer: Self-pay | Admitting: Cardiology

## 2017-02-08 VITALS — BP 103/65 | HR 92 | Temp 98.3°F | Ht 67.0 in

## 2017-02-08 DIAGNOSIS — M79674 Pain in right toe(s): Secondary | ICD-10-CM

## 2017-02-08 DIAGNOSIS — E1149 Type 2 diabetes mellitus with other diabetic neurological complication: Secondary | ICD-10-CM

## 2017-02-08 DIAGNOSIS — B351 Tinea unguium: Secondary | ICD-10-CM | POA: Diagnosis not present

## 2017-02-08 DIAGNOSIS — N186 End stage renal disease: Secondary | ICD-10-CM

## 2017-02-08 DIAGNOSIS — B955 Unspecified streptococcus as the cause of diseases classified elsewhere: Secondary | ICD-10-CM

## 2017-02-08 DIAGNOSIS — N185 Chronic kidney disease, stage 5: Secondary | ICD-10-CM

## 2017-02-08 DIAGNOSIS — A419 Sepsis, unspecified organism: Secondary | ICD-10-CM

## 2017-02-08 DIAGNOSIS — I480 Paroxysmal atrial fibrillation: Secondary | ICD-10-CM

## 2017-02-08 DIAGNOSIS — L732 Hidradenitis suppurativa: Secondary | ICD-10-CM

## 2017-02-08 DIAGNOSIS — R7881 Bacteremia: Secondary | ICD-10-CM

## 2017-02-08 DIAGNOSIS — Z992 Dependence on renal dialysis: Secondary | ICD-10-CM | POA: Diagnosis not present

## 2017-02-08 DIAGNOSIS — M79675 Pain in left toe(s): Secondary | ICD-10-CM

## 2017-02-08 NOTE — Telephone Encounter (Signed)
New Message   pt mother verbalized that she is returning call for rn

## 2017-02-08 NOTE — Progress Notes (Signed)
Subjective:   Chief complaint : Hospital follow-up for recent episode of Sirs that responded to vancomycin Zosyn and vancomycin and ceftazidime   Patient ID: [name], female    DOB: 11-18-1961, 56 y.o.   MRN: [id number]  HPI  [name] is a 55 year old lady with morbid obesity hidradenitis with chronic kidney disease on hemodialysis who developed streptococcal bacteremia in September 2017when she was an inpatient and whom we treated with vancomycin for streptococcal bacteremia along with ceftaz edema and oral Flagyl to cover her at [facility] which was flaring. After she completed the IV antibiotics for hidradenitis flared again and her antibiotics were extended by Dr. Megan Salon after he saw her in clinic.  When I last saw her we changed her over to oral Bactrim with the hopes that this would provide some further coverage for organisms involved with her infection of her hidradenitis lesions. Unfortunate she developed a sensation of hearing music in both of her ears constantly even when no music was playing. This was prominent while she was taking her Bactrim but has subsided since she has stopped that and change over to doxycycline but is not completely disappeared. She has seen her audiologist but apparently not discuss this with them. It is not clear to me that her hearing has been tested while she's had this symptom.  We yjrm  changef  her over to doxycycline but since then she has experienced worsening drainage from 3 areas in particular to on her left buttocks. They're quite painful and cause her to feel uncomfortable sitting.  We took cultures but these grew only corynebacterium though GNR was seen on GS. I continued doxy and added levaquin in February. She was admitted on 02/02/17 with fever, leukocytosis, SIRS, started on vanco/zosyn. She apparently refused surgical consultation.   She was changed to vancomycin and Test team for ease of dosing with dialysis and she is feeling much better with no  more fevers and improvement in her drainage posterior buttocks also in her inguinal area anteriorly. She did not want me to examine these wounds today.  She has been suffering from palpitations which sound like they're coming from paroxysmal atrial flutter and fibrillation. She was supposed to be on Coreg at discharge from the hospital recently but has not been taking this due to her fears of hypotension that may occur with dialysis. She is supposed to be followed up with cardiology and Dr. Radford Pax.        Past Medical History:  Diagnosis Date  . Chronic diastolic CHF (congestive heart failure) ([place])   . Chronic kidney disease (CKD), stage III (moderate)    [name] nephrology lov note 05-12-2013 on pt chart now on HD  . DCM (dilated cardiomyopathy) ([place])    nonischemic - EF now 50-55% by echo 2014  . Depression   . DM type 2 (diabetes mellitus, type 2) ([facility])    diet controlledwith retinopathy and nephropathy  . GERD (gastroesophageal reflux disease)   . H/O cardiac arrest 10/2012  . Hard of hearing 10/2012   now wears 2 hearing aids  . History of hemodialysis dec 2013  . HTN (hypertension)   . Hydradenitis   . Hyperkalemia   . Hyperlipidemia   . Hypothyroidism   . Iron deficiency anemia   . Morbid obesity ([place])   . Multinodular goiter   . Pneumonia Nov 10, 2012  . Sleep apnea    no test yet per mother 10/22/13   . Tinnitus 12/26/2016  . Urinary tract  infection    taking antibiotics for 3 days prior to surgery    Past Surgical History:  Procedure Laterality Date  . AV FISTULA PLACEMENT Left 07/25/2013   Procedure: ARTERIOVENOUS (AV) FISTULA CREATION ;  Surgeon: Rosetta Posner, MD;  Location: [place];  Service: Vascular;  Laterality: Left;  . CYSTOSCOPY W/ RETROGRADES  11/16/2012   Procedure: CYSTOSCOPY WITH RETROGRADE PYELOGRAM;  Surgeon: Reece Packer, MD;  Location: [facility];  Service: Urology;  Laterality: Bilateral;  CYSTOSCOPY,BILATERAL RETROGRADE PYELOGRAM/ REMOVAL  LEFT URETERAL STENT/ FULGERATION BLADDER MUCOSA/ INSERTION RIGHT URETERAL STENT  . CYSTOSCOPY W/ URETERAL STENT PLACEMENT  05/28/2012   Procedure: CYSTOSCOPY WITH RETROGRADE PYELOGRAM/URETERAL STENT PLACEMENT;  Surgeon: Reece Packer, MD;  Location: [facility];  Service: Urology;  Laterality: Left;  . CYSTOSCOPY WITH RETROGRADE PYELOGRAM, URETEROSCOPY AND STENT PLACEMENT Right 06/23/2013   Procedure: CYSTOSCOPY WITH RIGHT URETEROSCOPY, RIGHT  RETROGRADE PYELOGRAM, WITH LASER LIPOTRIPSY AND RIGHT URETERAL STENT EXCHANGE ;  Surgeon: Molli Hazard, MD;  Location: [facility];  Service: Urology;  Laterality: Right;  STENT EXCHANGE    . CYSTOSCOPY WITH RETROGRADE PYELOGRAM, URETEROSCOPY AND STENT PLACEMENT Right 11/06/2013   Procedure: CYSTOSCOPY WITH RETROGRADE PYELOGRAM, URETEROSCOPY STONE REMOVAL AND STENT REMOVAL;  Surgeon: Molli Hazard, MD;  Location: [facility];  Service: Urology;  Laterality: Right;  . HOLMIUM LASER APPLICATION N/A 7/86/7544   Procedure: HOLMIUM LASER APPLICATION;  Surgeon: Molli Hazard, MD;  Location: [facility];  Service: Urology;  Laterality: N/A;  . INSERTION OF DIALYSIS CATHETER N/A 09/24/2013   Procedure: INSERTION OF DIALYSIS CATHETER;  Surgeon: Rosetta Posner, MD;  Location: [place];  Service: Vascular;  Laterality: N/A;  . PORT-A-CATH REMOVAL    . PORTACATH PLACEMENT    . REVISON OF ARTERIOVENOUS FISTULA Left 07/25/2016   Procedure: REVISON OF LEFT ARTERIOVENOUS FISTULA;  Surgeon: Elam Dutch, MD;  Location: [facility];  Service: Vascular;  Laterality: Left;    Family History  Problem Relation Age of Onset  . Coronary artery disease Mother   . Hypertension Mother   . Diabetes type II Mother   . Coronary artery disease Father   . Hypertension Father   . Malignant hyperthermia Father   . Cancer Maternal Grandfather     ? Type  . Kidney failure Maternal Grandmother       Social History   Social History  . Marital status: Married    Spouse name: N/A    . Number of children: N/A  . Years of education: N/A   Social History Main Topics  . Smoking status: Former Smoker    Packs/day: 0.25    Years: 1.00    Types: Cigarettes    Quit date: 11/28/1979  . Smokeless tobacco: Never Used  . Alcohol use No  . Drug use: No  . Sexual activity: No   Other Topics Concern  . Not on file   Social History Narrative  . No narrative on file    Allergies  Allergen Reactions  . Amoxicillin Rash and Other (See Comments)    Tolerated Zosyn 01/2013. [place]  . Amoxicillin-Pot Clavulanate Diarrhea     Current Outpatient Prescriptions:  .  aspirin EC 81 MG tablet, Take 81 mg by mouth at bedtime. , Disp: , Rfl:  .  Azelastine-Fluticasone 137-50 MCG/ACT SUSP, Place 2 sprays into the nose daily., Disp: 1 Bottle, Rfl: 3 .  Calcium Acetate 667 MG TABS, Take 667 mg by mouth 3 (three) times daily with meals. And  snacks, Disp: , Rfl:  .  cefTAZidime 2 g in dextrose 5 % 50 mL, Inject 2 g into the vein every Monday, Wednesday, and Friday with hemodialysis., Disp: 300 mL, Rfl: 0 .  clindamycin (CLEOCIN T) 1 % external solution, Apply 1 application topically 2 (two) times daily. , Disp: , Rfl:  .  DULoxetine (CYMBALTA) 60 MG capsule, Take 1 capsule (60 mg total) by mouth daily., Disp: 30 capsule, Rfl: 0 .  famotidine (PEPCID) 20 MG tablet, TAKE 1 TABLET BY MOUTH AT BEDTIME (Patient taking differently: Take 20 mg by mouth at bedtime), Disp: 30 tablet, Rfl: 0 .  feeding supplement, RESOURCE BREEZE, (RESOURCE BREEZE) LIQD, Take 2 Containers by mouth 2 (two) times daily between meals. , Disp: , Rfl:  .  glipiZIDE (GLUCOTROL) 5 MG tablet, Take 0.5 tablets (2.5 mg total) by mouth daily., Disp: 30 tablet, Rfl: 0 .  levothyroxine (SYNTHROID, LEVOTHROID) 75 MCG tablet, Take 75 mcg by mouth daily., Disp: , Rfl: 0 .  midodrine (PROAMATINE) 10 MG tablet, Take 1 tablet (10 mg total) by mouth 3 (three) times daily. (Patient taking differently: Take 10 mg by mouth every  Monday, Wednesday, and Friday. Dialysis days), Disp: 90 tablet, Rfl: 0 .  multivitamin (RENA-VIT) TABS tablet, Take 1 tablet by mouth daily., Disp: , Rfl: 6 .  oxyCODONE-acetaminophen (PERCOCET/ROXICET) 5-325 MG tablet, Take 1-2 tablets by mouth every 6 (six) hours as needed. Patient has not taken since started dialysis 08/2013 due to causes hypotension (Patient taking differently: Take 1-2 tablets by mouth every 6 (six) hours as needed for moderate pain. ), Disp: 6 tablet, Rfl: 0 .  pravastatin (PRAVACHOL) 40 MG tablet, Take 40 mg by mouth daily. Patient takes in am, Disp: , Rfl:  .  Probiotic Product (PROBIOTIC DAILY PO), Take 1 tablet by mouth daily. , Disp: , Rfl:  .  sevelamer carbonate (RENVELA) 800 MG tablet, Take 2,400 mg by mouth 3 (three) times daily with meals., Disp: , Rfl:  .  sucroferric oxyhydroxide (VELPHORO) 500 MG chewable tablet, Chew 500 mg by mouth 3 (three) times daily with meals., Disp: , Rfl:  .  vancomycin (VANCOCIN) 1-5 GM/200ML-% SOLN, Inject 200 mLs (1,000 mg total) into the vein every Monday, Wednesday, and Friday with hemodialysis., Disp: 6000 mL, Rfl: 0    Review of Systems  Constitutional: Negative for chills, diaphoresis and fever.  HENT: Negative for congestion, hearing loss, sore throat and tinnitus.   Respiratory: Negative for cough, shortness of breath and wheezing.   Cardiovascular: Positive for palpitations. Negative for chest pain and leg swelling.  Gastrointestinal: Negative for abdominal pain, blood in stool, constipation, diarrhea, nausea and vomiting.  Genitourinary: Negative for dysuria, flank pain and hematuria.  Musculoskeletal: Negative for back pain and myalgias.  Skin: Positive for color change, rash and wound.  Neurological: Negative for dizziness, weakness and headaches.  Hematological: Does not bruise/bleed easily.  Psychiatric/Behavioral: Negative for suicidal ideas. The patient is nervous/anxious.        Objective:   Physical Exam   Constitutional: She is oriented to person, place, and time. She appears well-developed and well-nourished. No distress.  HENT:  Head: Normocephalic and atraumatic.  Eyes: Conjunctivae and EOM are normal. No scleral icterus.  Neck: Normal range of motion. Neck supple.  Cardiovascular: Normal rate.  An irregularly irregular rhythm present.  Pulmonary/Chest: Effort normal. No respiratory distress. She has no wheezes.  Abdominal: She exhibits no distension.  Musculoskeletal: She exhibits edema.  Neurological: She is alert and oriented  to person, place, and time. Coordination normal.  Skin: Skin is warm and dry. She is not diaphoretic.  Psychiatric: She has a normal mood and affect. Her behavior is normal. Judgment and thought content normal.    Right groin lesion 11/09/2016:     Buttocks wounds today on 12/26/2016     Left lower buttocks 12/26/16:    She deferred wound exam today     Assessment & Plan:   Hidradenitis suppurativa:  Finish 2 week course of IV vanco, ceftaz followup with [place] in next month  It is too bad there is apparently no local surgeon that can help her or at [facility] in terms of more definitive therapy. Same thing in the world of Dermatology it appears  Morbid obesity  ESRD on HD  Streptococcal bacteremia: Resolved  Palpitations: due to PAF, defer to Cardiology  I spent greater than 25 minutes with the patient including greater than 50% of time in face to face counsel of the patient and her mother with regards to her hidradenitis her end-stage renal disease on dialysis for streptococcal bacteremia and in coordination of her care.

## 2017-02-08 NOTE — Telephone Encounter (Signed)
Patient's mother reports she feels fine not taking the Coreg. She will keep appointment for event monitor next week and understands follow-up appointments will be made as necessary. She will call if symptoms reoccur or if BP drops. She was grateful for call.

## 2017-02-09 DIAGNOSIS — N186 End stage renal disease: Secondary | ICD-10-CM | POA: Diagnosis not present

## 2017-02-09 DIAGNOSIS — N2581 Secondary hyperparathyroidism of renal origin: Secondary | ICD-10-CM | POA: Diagnosis not present

## 2017-02-09 DIAGNOSIS — E1129 Type 2 diabetes mellitus with other diabetic kidney complication: Secondary | ICD-10-CM | POA: Diagnosis not present

## 2017-02-09 DIAGNOSIS — L732 Hidradenitis suppurativa: Secondary | ICD-10-CM | POA: Diagnosis not present

## 2017-02-09 DIAGNOSIS — D631 Anemia in chronic kidney disease: Secondary | ICD-10-CM | POA: Diagnosis not present

## 2017-02-09 DIAGNOSIS — D509 Iron deficiency anemia, unspecified: Secondary | ICD-10-CM | POA: Diagnosis not present

## 2017-02-12 DIAGNOSIS — N186 End stage renal disease: Secondary | ICD-10-CM | POA: Diagnosis not present

## 2017-02-12 DIAGNOSIS — L732 Hidradenitis suppurativa: Secondary | ICD-10-CM | POA: Diagnosis not present

## 2017-02-12 DIAGNOSIS — D509 Iron deficiency anemia, unspecified: Secondary | ICD-10-CM | POA: Diagnosis not present

## 2017-02-12 DIAGNOSIS — N2581 Secondary hyperparathyroidism of renal origin: Secondary | ICD-10-CM | POA: Diagnosis not present

## 2017-02-12 DIAGNOSIS — E1129 Type 2 diabetes mellitus with other diabetic kidney complication: Secondary | ICD-10-CM | POA: Diagnosis not present

## 2017-02-12 DIAGNOSIS — D631 Anemia in chronic kidney disease: Secondary | ICD-10-CM | POA: Diagnosis not present

## 2017-02-12 NOTE — Progress Notes (Signed)
Subjective: 56 y.o. returns the office today for painful, elongated, thickened toenails which she cannot trim herself. Denies any redness or drainage around the nails. Her other foot pain is much improved. Denies any acute changes since last appointment and no new complaints today. Denies any systemic complaints such as fevers, chills, nausea, vomiting.   Objective: AAO 3, NAD DP/PT pulses palpable, CRT less than 3 seconds Protective sensation decreased with Simms Weinstein monofilament Nails hypertrophic, dystrophic, elongated, brittle, discolored 10. There is tenderness overlying the nails 1-5 bilaterally. There is no surrounding erythema or drainage along the nail sites. No open lesions or pre-ulcerative lesions are identified. No other areas of tenderness bilateral lower extremities. No overlying edema, erythema, increased warmth. No pain with calf compression, swelling, warmth, erythema.  Assessment: Patient presents with symptomatic onychomycosis  Plan: -Treatment options including alternatives, risks, complications were discussed -Nails sharply debrided 10 without complication/bleeding. -Discussed daily foot inspection. If there are any changes, to call the office immediately.  -Follow-up in 3 months or sooner if any problems are to arise. In the meantime, encouraged to call the office with any questions, concerns, changes symptoms.  Celesta Gentile, DPM

## 2017-02-14 DIAGNOSIS — L732 Hidradenitis suppurativa: Secondary | ICD-10-CM | POA: Diagnosis not present

## 2017-02-14 DIAGNOSIS — D631 Anemia in chronic kidney disease: Secondary | ICD-10-CM | POA: Diagnosis not present

## 2017-02-14 DIAGNOSIS — D509 Iron deficiency anemia, unspecified: Secondary | ICD-10-CM | POA: Diagnosis not present

## 2017-02-14 DIAGNOSIS — N186 End stage renal disease: Secondary | ICD-10-CM | POA: Diagnosis not present

## 2017-02-14 DIAGNOSIS — E1129 Type 2 diabetes mellitus with other diabetic kidney complication: Secondary | ICD-10-CM | POA: Diagnosis not present

## 2017-02-14 DIAGNOSIS — N2581 Secondary hyperparathyroidism of renal origin: Secondary | ICD-10-CM | POA: Diagnosis not present

## 2017-02-15 ENCOUNTER — Ambulatory Visit (INDEPENDENT_AMBULATORY_CARE_PROVIDER_SITE_OTHER): Payer: Medicare Other

## 2017-02-15 DIAGNOSIS — I480 Paroxysmal atrial fibrillation: Secondary | ICD-10-CM | POA: Diagnosis not present

## 2017-02-16 DIAGNOSIS — E1129 Type 2 diabetes mellitus with other diabetic kidney complication: Secondary | ICD-10-CM | POA: Diagnosis not present

## 2017-02-16 DIAGNOSIS — L732 Hidradenitis suppurativa: Secondary | ICD-10-CM | POA: Diagnosis not present

## 2017-02-16 DIAGNOSIS — N2581 Secondary hyperparathyroidism of renal origin: Secondary | ICD-10-CM | POA: Diagnosis not present

## 2017-02-16 DIAGNOSIS — D631 Anemia in chronic kidney disease: Secondary | ICD-10-CM | POA: Diagnosis not present

## 2017-02-16 DIAGNOSIS — N186 End stage renal disease: Secondary | ICD-10-CM | POA: Diagnosis not present

## 2017-02-16 DIAGNOSIS — D509 Iron deficiency anemia, unspecified: Secondary | ICD-10-CM | POA: Diagnosis not present

## 2017-02-19 DIAGNOSIS — E1129 Type 2 diabetes mellitus with other diabetic kidney complication: Secondary | ICD-10-CM | POA: Diagnosis not present

## 2017-02-19 DIAGNOSIS — L732 Hidradenitis suppurativa: Secondary | ICD-10-CM | POA: Diagnosis not present

## 2017-02-19 DIAGNOSIS — D631 Anemia in chronic kidney disease: Secondary | ICD-10-CM | POA: Diagnosis not present

## 2017-02-19 DIAGNOSIS — N2581 Secondary hyperparathyroidism of renal origin: Secondary | ICD-10-CM | POA: Diagnosis not present

## 2017-02-19 DIAGNOSIS — D509 Iron deficiency anemia, unspecified: Secondary | ICD-10-CM | POA: Diagnosis not present

## 2017-02-19 DIAGNOSIS — N186 End stage renal disease: Secondary | ICD-10-CM | POA: Diagnosis not present

## 2017-02-21 DIAGNOSIS — E1129 Type 2 diabetes mellitus with other diabetic kidney complication: Secondary | ICD-10-CM | POA: Diagnosis not present

## 2017-02-21 DIAGNOSIS — D631 Anemia in chronic kidney disease: Secondary | ICD-10-CM | POA: Diagnosis not present

## 2017-02-21 DIAGNOSIS — N186 End stage renal disease: Secondary | ICD-10-CM | POA: Diagnosis not present

## 2017-02-21 DIAGNOSIS — L732 Hidradenitis suppurativa: Secondary | ICD-10-CM | POA: Diagnosis not present

## 2017-02-21 DIAGNOSIS — D509 Iron deficiency anemia, unspecified: Secondary | ICD-10-CM | POA: Diagnosis not present

## 2017-02-21 DIAGNOSIS — N2581 Secondary hyperparathyroidism of renal origin: Secondary | ICD-10-CM | POA: Diagnosis not present

## 2017-02-23 DIAGNOSIS — L732 Hidradenitis suppurativa: Secondary | ICD-10-CM | POA: Diagnosis not present

## 2017-02-23 DIAGNOSIS — D631 Anemia in chronic kidney disease: Secondary | ICD-10-CM | POA: Diagnosis not present

## 2017-02-23 DIAGNOSIS — N2581 Secondary hyperparathyroidism of renal origin: Secondary | ICD-10-CM | POA: Diagnosis not present

## 2017-02-23 DIAGNOSIS — E1129 Type 2 diabetes mellitus with other diabetic kidney complication: Secondary | ICD-10-CM | POA: Diagnosis not present

## 2017-02-23 DIAGNOSIS — N186 End stage renal disease: Secondary | ICD-10-CM | POA: Diagnosis not present

## 2017-02-23 DIAGNOSIS — D509 Iron deficiency anemia, unspecified: Secondary | ICD-10-CM | POA: Diagnosis not present

## 2017-02-24 DIAGNOSIS — Z992 Dependence on renal dialysis: Secondary | ICD-10-CM | POA: Diagnosis not present

## 2017-02-24 DIAGNOSIS — E1129 Type 2 diabetes mellitus with other diabetic kidney complication: Secondary | ICD-10-CM | POA: Diagnosis not present

## 2017-02-24 DIAGNOSIS — N186 End stage renal disease: Secondary | ICD-10-CM | POA: Diagnosis not present

## 2017-02-26 ENCOUNTER — Other Ambulatory Visit: Payer: Self-pay | Admitting: Infectious Disease

## 2017-02-26 DIAGNOSIS — N186 End stage renal disease: Secondary | ICD-10-CM | POA: Diagnosis not present

## 2017-02-26 DIAGNOSIS — D631 Anemia in chronic kidney disease: Secondary | ICD-10-CM | POA: Diagnosis not present

## 2017-02-26 DIAGNOSIS — D509 Iron deficiency anemia, unspecified: Secondary | ICD-10-CM | POA: Diagnosis not present

## 2017-02-26 DIAGNOSIS — L732 Hidradenitis suppurativa: Secondary | ICD-10-CM | POA: Diagnosis not present

## 2017-02-26 DIAGNOSIS — E1129 Type 2 diabetes mellitus with other diabetic kidney complication: Secondary | ICD-10-CM | POA: Diagnosis not present

## 2017-02-26 DIAGNOSIS — N2581 Secondary hyperparathyroidism of renal origin: Secondary | ICD-10-CM | POA: Diagnosis not present

## 2017-02-28 DIAGNOSIS — N186 End stage renal disease: Secondary | ICD-10-CM | POA: Diagnosis not present

## 2017-02-28 DIAGNOSIS — D509 Iron deficiency anemia, unspecified: Secondary | ICD-10-CM | POA: Diagnosis not present

## 2017-02-28 DIAGNOSIS — E1129 Type 2 diabetes mellitus with other diabetic kidney complication: Secondary | ICD-10-CM | POA: Diagnosis not present

## 2017-02-28 DIAGNOSIS — L732 Hidradenitis suppurativa: Secondary | ICD-10-CM | POA: Diagnosis not present

## 2017-02-28 DIAGNOSIS — N2581 Secondary hyperparathyroidism of renal origin: Secondary | ICD-10-CM | POA: Diagnosis not present

## 2017-02-28 DIAGNOSIS — D631 Anemia in chronic kidney disease: Secondary | ICD-10-CM | POA: Diagnosis not present

## 2017-03-01 ENCOUNTER — Telehealth: Payer: Self-pay

## 2017-03-01 NOTE — Telephone Encounter (Signed)
Called and spoke with Velva Harman, nephrologist PA, and gave verbal orders for 2 weeks of IV vanc for [name] until 4/19 - 1gm w/ HD. Pre-HD level target 15-25. Pt has appt with Dr. Tommy Medal at the end of this month.

## 2017-03-01 NOTE — Telephone Encounter (Signed)
Thanks Cassie, I dont think there is ever a great end game with [name] but she has done better with systemic IV abx with HD recently

## 2017-03-01 NOTE — Telephone Encounter (Signed)
Can we have her nephrologist start her on IV vancomycin with HD x 2 weeks?

## 2017-03-01 NOTE — Telephone Encounter (Signed)
Patients Mother is calling.   Patient has complaint of increased pain and burning in affected sites.  Currently she is not on any antibiotics.   Please advise.    Laverle Patter, RN

## 2017-03-02 ENCOUNTER — Telehealth: Payer: Self-pay | Admitting: Cardiology

## 2017-03-02 DIAGNOSIS — N186 End stage renal disease: Secondary | ICD-10-CM | POA: Diagnosis not present

## 2017-03-02 DIAGNOSIS — E1129 Type 2 diabetes mellitus with other diabetic kidney complication: Secondary | ICD-10-CM | POA: Diagnosis not present

## 2017-03-02 DIAGNOSIS — D509 Iron deficiency anemia, unspecified: Secondary | ICD-10-CM | POA: Diagnosis not present

## 2017-03-02 DIAGNOSIS — L732 Hidradenitis suppurativa: Secondary | ICD-10-CM | POA: Diagnosis not present

## 2017-03-02 DIAGNOSIS — N2581 Secondary hyperparathyroidism of renal origin: Secondary | ICD-10-CM | POA: Diagnosis not present

## 2017-03-02 DIAGNOSIS — D631 Anemia in chronic kidney disease: Secondary | ICD-10-CM | POA: Diagnosis not present

## 2017-03-02 NOTE — Telephone Encounter (Signed)
New message    Abnormal ekg

## 2017-03-02 NOTE — Telephone Encounter (Signed)
Monitor strips received and reviewed with DOD, Dr. Saunders Revel. Strips showed afib/flutter at 150 bpm.  Per Dr. Saunders Revel, instructed patient to go to the ED for treatment. Patient adamantly refused to go to ED. She states she feels fine at this time. Reiterated to patient the dangers of having such a high, erratic HR. She understands she is at greater risk for clot and stroke and even sudden cardiac death. Reviewed s/s of afib (SOB, CP, dizziness, palpitations, fatigue, weakness, racing heart).  The patient states she feels fine now, but will go to ED if symptoms occur.

## 2017-03-02 NOTE — Telephone Encounter (Signed)
Left message for patient to call back to see if she is experiencing any symptoms.    Called patient's mother (DPR) [name], patient is with her mother.  Just picked up from dialysis.   She did sense the atrial fibrillation (fluttering).  Denies that she was SOB or dizzy at that time but currently feels SOB. advised patient to record her SOB on the monitor.    Advised that once I receive the faxed rhythm strips I will review with DOD and will call her back.

## 2017-03-02 NOTE — Telephone Encounter (Signed)
Received call from [place] finding of atrial fibrillation, 150 bpm.  [employer] to fax rhythm strips at this time.

## 2017-03-04 NOTE — Telephone Encounter (Signed)
It appears that patient did not go to ER.  Please get her in with afib clinic early this week

## 2017-03-05 DIAGNOSIS — N2581 Secondary hyperparathyroidism of renal origin: Secondary | ICD-10-CM | POA: Diagnosis not present

## 2017-03-05 DIAGNOSIS — E1129 Type 2 diabetes mellitus with other diabetic kidney complication: Secondary | ICD-10-CM | POA: Diagnosis not present

## 2017-03-05 DIAGNOSIS — D509 Iron deficiency anemia, unspecified: Secondary | ICD-10-CM | POA: Diagnosis not present

## 2017-03-05 DIAGNOSIS — D631 Anemia in chronic kidney disease: Secondary | ICD-10-CM | POA: Diagnosis not present

## 2017-03-05 DIAGNOSIS — N186 End stage renal disease: Secondary | ICD-10-CM | POA: Diagnosis not present

## 2017-03-05 DIAGNOSIS — L732 Hidradenitis suppurativa: Secondary | ICD-10-CM | POA: Diagnosis not present

## 2017-03-06 DIAGNOSIS — Z992 Dependence on renal dialysis: Secondary | ICD-10-CM | POA: Diagnosis not present

## 2017-03-06 DIAGNOSIS — I871 Compression of vein: Secondary | ICD-10-CM | POA: Diagnosis not present

## 2017-03-06 DIAGNOSIS — N186 End stage renal disease: Secondary | ICD-10-CM | POA: Diagnosis not present

## 2017-03-06 NOTE — Telephone Encounter (Signed)
Left message for pt to call and schedule appt 

## 2017-03-06 NOTE — Telephone Encounter (Signed)
Informed patient of Dr. Theodosia Blender recommendations to be followed by [facility]. She understands Afib Clinic will call with appointment time. She was grateful for call.

## 2017-03-07 DIAGNOSIS — N2581 Secondary hyperparathyroidism of renal origin: Secondary | ICD-10-CM | POA: Diagnosis not present

## 2017-03-07 DIAGNOSIS — N186 End stage renal disease: Secondary | ICD-10-CM | POA: Diagnosis not present

## 2017-03-07 DIAGNOSIS — E1129 Type 2 diabetes mellitus with other diabetic kidney complication: Secondary | ICD-10-CM | POA: Diagnosis not present

## 2017-03-07 DIAGNOSIS — D509 Iron deficiency anemia, unspecified: Secondary | ICD-10-CM | POA: Diagnosis not present

## 2017-03-07 DIAGNOSIS — D631 Anemia in chronic kidney disease: Secondary | ICD-10-CM | POA: Diagnosis not present

## 2017-03-07 DIAGNOSIS — L732 Hidradenitis suppurativa: Secondary | ICD-10-CM | POA: Diagnosis not present

## 2017-03-08 ENCOUNTER — Ambulatory Visit (HOSPITAL_COMMUNITY)
Admission: RE | Admit: 2017-03-08 | Discharge: 2017-03-08 | Disposition: A | Discharge status: Home / Self Care | Payer: Medicare Other | Source: Ambulatory Visit | Attending: Nurse Practitioner | Admitting: Nurse Practitioner

## 2017-03-08 ENCOUNTER — Encounter (HOSPITAL_COMMUNITY): Payer: Self-pay | Admitting: Nurse Practitioner

## 2017-03-08 VITALS — BP 110/64 | HR 92 | Ht 67.0 in | Wt 339.2 lb

## 2017-03-08 DIAGNOSIS — I420 Dilated cardiomyopathy: Secondary | ICD-10-CM | POA: Insufficient documentation

## 2017-03-08 DIAGNOSIS — Z7982 Long term (current) use of aspirin: Secondary | ICD-10-CM | POA: Insufficient documentation

## 2017-03-08 DIAGNOSIS — I5032 Chronic diastolic (congestive) heart failure: Secondary | ICD-10-CM | POA: Insufficient documentation

## 2017-03-08 DIAGNOSIS — N186 End stage renal disease: Secondary | ICD-10-CM | POA: Insufficient documentation

## 2017-03-08 DIAGNOSIS — Z87891 Personal history of nicotine dependence: Secondary | ICD-10-CM | POA: Insufficient documentation

## 2017-03-08 DIAGNOSIS — E039 Hypothyroidism, unspecified: Secondary | ICD-10-CM | POA: Diagnosis not present

## 2017-03-08 DIAGNOSIS — E785 Hyperlipidemia, unspecified: Secondary | ICD-10-CM | POA: Insufficient documentation

## 2017-03-08 DIAGNOSIS — Z79899 Other long term (current) drug therapy: Secondary | ICD-10-CM | POA: Insufficient documentation

## 2017-03-08 DIAGNOSIS — E875 Hyperkalemia: Secondary | ICD-10-CM | POA: Insufficient documentation

## 2017-03-08 DIAGNOSIS — J449 Chronic obstructive pulmonary disease, unspecified: Secondary | ICD-10-CM | POA: Insufficient documentation

## 2017-03-08 DIAGNOSIS — Z8674 Personal history of sudden cardiac arrest: Secondary | ICD-10-CM | POA: Diagnosis not present

## 2017-03-08 DIAGNOSIS — Z6841 Body Mass Index (BMI) 40.0 and over, adult: Secondary | ICD-10-CM | POA: Insufficient documentation

## 2017-03-08 DIAGNOSIS — L732 Hidradenitis suppurativa: Secondary | ICD-10-CM | POA: Insufficient documentation

## 2017-03-08 DIAGNOSIS — Z7984 Long term (current) use of oral hypoglycemic drugs: Secondary | ICD-10-CM | POA: Diagnosis not present

## 2017-03-08 DIAGNOSIS — K219 Gastro-esophageal reflux disease without esophagitis: Secondary | ICD-10-CM | POA: Insufficient documentation

## 2017-03-08 DIAGNOSIS — Z880 Allergy status to penicillin: Secondary | ICD-10-CM | POA: Insufficient documentation

## 2017-03-08 DIAGNOSIS — Z9981 Dependence on supplemental oxygen: Secondary | ICD-10-CM | POA: Diagnosis not present

## 2017-03-08 DIAGNOSIS — I130 Hypertensive heart and chronic kidney disease with heart failure and stage 1 through stage 4 chronic kidney disease, or unspecified chronic kidney disease: Secondary | ICD-10-CM | POA: Insufficient documentation

## 2017-03-08 DIAGNOSIS — E1122 Type 2 diabetes mellitus with diabetic chronic kidney disease: Secondary | ICD-10-CM | POA: Insufficient documentation

## 2017-03-08 DIAGNOSIS — Z992 Dependence on renal dialysis: Secondary | ICD-10-CM | POA: Diagnosis not present

## 2017-03-08 DIAGNOSIS — F329 Major depressive disorder, single episode, unspecified: Secondary | ICD-10-CM | POA: Insufficient documentation

## 2017-03-08 DIAGNOSIS — I4892 Unspecified atrial flutter: Secondary | ICD-10-CM

## 2017-03-09 ENCOUNTER — Other Ambulatory Visit (HOSPITAL_COMMUNITY): Payer: Self-pay | Admitting: *Deleted

## 2017-03-09 MED ORDER — DILTIAZEM HCL 30 MG PO TABS: ORAL_TABLET | ORAL | 2 refills | Status: AC

## 2017-03-09 NOTE — Progress Notes (Signed)
Primary Care Physician: [name], MD Referring Physician: Dr. Rondel Oh [name] is a 56 y.o. female with a h/o ESRD, on dilaysis, dilated cardiomyopathy, now with normalized EF at 50-55%, DM, morbid obesity,chronic diastolic HF,chronically low BP's, chronic shortness of breath with COPD with 02 at night. She also has h/o of hydradenitis suppurativa. She was just hospitalized for sepsis due to infected hydaenitis areas. She was given antibiotics. She also was noted to have tachycardia and was given dilt IV push.Low dose coreg was started and holter monitor was placed, seen by cards. Chadsvasc score is 3. Recommended if recurrent afib, consider warfarin.  She is in afib clinic for f/u at request of Dr. Radford Pax She is still wearing event monitor. Strips that are reviewed appear to be atrial flutter at v rate of 150 bpm. Pt will feel irregular heart beat only for a few minutes when she is trying to go to bed at night.  She cannot tolerate rate control for issues with hypotension with dialysis. Pt states that she would not be able to tolerate coumdain for her open skin lesions 2/2 hydradenitis that she hs on her groin and buttocks that are associated with constant bleeding/oozing of same. This is a chronic issue and will not get better.  Today, she denies symptoms of palpitations, chest pain, shortness of breath, orthopnea, PND, lower extremity edema, dizziness, presyncope, syncope, or neurologic sequela. The patient is tolerating medications without difficulties and is otherwise without complaint today.   Past Medical History:  Diagnosis Date  . Chronic diastolic CHF (congestive heart failure) ([place])   . Chronic kidney disease (CKD), stage III (moderate)    [name] nephrology lov note 05-12-2013 on pt chart now on HD  . DCM (dilated cardiomyopathy) ([place])    nonischemic - EF now 50-55% by echo 2014  . Depression   . DM type 2 (diabetes mellitus, type 2) ([facility])    diet controlledwith  retinopathy and nephropathy  . GERD (gastroesophageal reflux disease)   . H/O cardiac arrest 10/2012  . Hard of hearing 10/2012   now wears 2 hearing aids  . History of hemodialysis dec 2013  . HTN (hypertension)   . Hydradenitis   . Hyperkalemia   . Hyperlipidemia   . Hypothyroidism   . Iron deficiency anemia   . Morbid obesity ([place])   . Multinodular goiter   . Pneumonia Nov 10, 2012  . Sleep apnea    no test yet per mother 10/22/13   . Tinnitus 12/26/2016  . Urinary tract infection    taking antibiotics for 3 days prior to surgery   Past Surgical History:  Procedure Laterality Date  . AV FISTULA PLACEMENT Left 07/25/2013   Procedure: ARTERIOVENOUS (AV) FISTULA CREATION ;  Surgeon: Rosetta Posner, MD;  Location: [facility];  Service: Vascular;  Laterality: Left;  . CYSTOSCOPY W/ RETROGRADES  11/16/2012   Procedure: CYSTOSCOPY WITH RETROGRADE PYELOGRAM;  Surgeon: Reece Packer, MD;  [facility];  Service: Urology;  Laterality: Bilateral;  CYSTOSCOPY,BILATERAL RETROGRADE PYELOGRAM/ REMOVAL LEFT URETERAL STENT/ FULGERATION BLADDER MUCOSA/ INSERTION RIGHT URETERAL STENT  . CYSTOSCOPY W/ URETERAL STENT PLACEMENT  05/28/2012   Procedure: CYSTOSCOPY WITH RETROGRADE PYELOGRAM/URETERAL STENT PLACEMENT;  Surgeon: Reece Packer, MD;  [facility];  Service: Urology;  Laterality: Left;  . CYSTOSCOPY WITH RETROGRADE PYELOGRAM, URETEROSCOPY AND STENT PLACEMENT Right 06/23/2013   Procedure: CYSTOSCOPY WITH RIGHT URETEROSCOPY, RIGHT  RETROGRADE PYELOGRAM, WITH LASER LIPOTRIPSY AND RIGHT URETERAL STENT EXCHANGE ;  Surgeon: Molli Hazard, MD;  [facility];  Service: Urology;  Laterality: Right;  STENT EXCHANGE    . CYSTOSCOPY WITH RETROGRADE PYELOGRAM, URETEROSCOPY AND STENT PLACEMENT Right 11/06/2013   Procedure: CYSTOSCOPY WITH RETROGRADE PYELOGRAM, URETEROSCOPY STONE REMOVAL AND STENT REMOVAL;  Surgeon: Molli Hazard, MD;  [facility];  Service: Urology;   Laterality: Right;  . HOLMIUM LASER APPLICATION N/A 8/56/3149   Procedure: HOLMIUM LASER APPLICATION;  Surgeon: Molli Hazard, MD;  [facility];  Service: Urology;  Laterality: N/A;  . INSERTION OF DIALYSIS CATHETER N/A 09/24/2013   Procedure: INSERTION OF DIALYSIS CATHETER;  Surgeon: Rosetta Posner, MD;  Location: [facility];  Service: Vascular;  Laterality: N/A;  . PORT-A-CATH REMOVAL    . PORTACATH PLACEMENT    . REVISON OF ARTERIOVENOUS FISTULA Left 07/25/2016   Procedure: REVISON OF LEFT ARTERIOVENOUS FISTULA;  Surgeon: Elam Dutch, MD;  Location: [facility];  Service: Vascular;  Laterality: Left;    Current Outpatient Prescriptions  Medication Sig Dispense Refill  . aspirin EC 81 MG tablet Take 81 mg by mouth at bedtime.     . Azelastine-Fluticasone 137-50 MCG/ACT SUSP Place 2 sprays into the nose daily. 1 Bottle 3  . Calcium Acetate 667 MG TABS Take 667 mg by mouth 3 (three) times daily with meals. And snacks    . DULoxetine (CYMBALTA) 60 MG capsule Take 1 capsule (60 mg total) by mouth daily. 30 capsule 0  . famotidine (PEPCID) 20 MG tablet TAKE 1 TABLET BY MOUTH AT BEDTIME (Patient taking differently: Take 20 mg by mouth at bedtime) 30 tablet 0  . feeding supplement, RESOURCE BREEZE, (RESOURCE BREEZE) LIQD Take 2 Containers by mouth 2 (two) times daily between meals.     Marland Kitchen glipiZIDE (GLUCOTROL) 5 MG tablet Take 0.5 tablets (2.5 mg total) by mouth daily. 30 tablet 0  . levothyroxine (SYNTHROID, LEVOTHROID) 75 MCG tablet Take 75 mcg by mouth daily.  0  . midodrine (PROAMATINE) 10 MG tablet Take 1 tablet (10 mg total) by mouth 3 (three) times daily. (Patient taking differently: Take 10 mg by mouth every Monday, Wednesday, and Friday. Dialysis days) 90 tablet 0  . multivitamin (RENA-VIT) TABS tablet Take 1 tablet by mouth daily.  6  . oxyCODONE-acetaminophen (PERCOCET/ROXICET) 5-325 MG tablet Take 1-2 tablets by mouth every 6 (six) hours as needed. Patient has not taken since started  dialysis 08/2013 due to causes hypotension (Patient taking differently: Take 1-2 tablets by mouth every 6 (six) hours as needed for moderate pain. ) 6 tablet 0  . pravastatin (PRAVACHOL) 40 MG tablet Take 40 mg by mouth daily. Patient takes in am    . Probiotic Product (PROBIOTIC DAILY PO) Take 1 tablet by mouth daily.     . sevelamer carbonate (RENVELA) 800 MG tablet Take 2,400 mg by mouth 3 (three) times daily with meals.    . sucroferric oxyhydroxide (VELPHORO) 500 MG chewable tablet Chew 500 mg by mouth 3 (three) times daily with meals.    . vancomycin (VANCOCIN) 1-5 GM/200ML-% SOLN Inject 200 mLs (1,000 mg total) into the vein every Monday, Wednesday, and Friday with hemodialysis. 6000 mL 0  . clindamycin (CLEOCIN T) 1 % external solution Apply 1 application topically 2 (two) times daily.      No current facility-administered medications for this encounter.     Allergies  Allergen Reactions  . Amoxicillin Rash and Other (See Comments)    Tolerated Zosyn 01/2013.   . Amoxicillin-Pot Clavulanate  Diarrhea    Social History   Social History  . Marital status: Married    Spouse name: N/A  . Number of children: N/A  . Years of education: N/A   Occupational History  . Not on file.   Social History Main Topics  . Smoking status: Former Smoker    Packs/day: 0.25    Years: 1.00    Types: Cigarettes    Quit date: 11/28/1979  . Smokeless tobacco: Never Used  . Alcohol use No  . Drug use: No  . Sexual activity: No   Other Topics Concern  . Not on file   Social History Narrative  . No narrative on file    Family History  Problem Relation Age of Onset  . Coronary artery disease Mother   . Hypertension Mother   . Diabetes type II Mother   . Coronary artery disease Father   . Hypertension Father   . Malignant hyperthermia Father   . Cancer Maternal Grandfather     ? Type  . Kidney failure Maternal Grandmother     ROS- All systems are reviewed and negative  except as per the HPI above  Physical Exam: Vitals:   03/08/17 1340  BP: 110/64  Pulse: 92  Weight: (!) 339 lb 3.2 oz (153.9 kg)  Height: 5\' 7"  (1.702 m)   Wt Readings from Last 3 Encounters:  03/08/17 (!) 339 lb 3.2 oz (153.9 kg)  02/03/17 (!) 343 lb 3.2 oz (155.7 kg)  01/30/17 (!) 341 lb 3.2 oz (154.8 kg)    Labs: Lab Results  Component Value Date   NA 135 02/03/2017   K 5.0 02/03/2017   CL 101 02/03/2017   CO2 17 (L) 02/03/2017   GLUCOSE 123 (H) 02/03/2017   BUN 74 (H) 02/03/2017   CREATININE 10.45 (H) 02/03/2017   CALCIUM 9.1 02/03/2017   PHOS 1.7 (L) 08/25/2016   MG 2.6 (H) 07/27/2013   Lab Results  Component Value Date   INR 1.40 04/30/2015   Lab Results  Component Value Date   CHOL 80 01/20/2012   HDL 28 (L) 01/20/2012   LDLCALC 41 01/20/2012   TRIG 55 01/20/2012     GEN- The patient is well appearing, alert and oriented x 3 today.   Head- normocephalic, atraumatic Eyes-  Sclera clear, conjunctiva pink Ears- hearing intact Oropharynx- clear Neck- supple, no JVP Lymph- no cervical lymphadenopathy Lungs- Clear to ausculation bilaterally, normal work of breathing Heart- Regular rate and rhythm, no murmurs, rubs or gallops, PMI not laterally displaced GI- soft, NT, ND, + BS Extremities- no clubbing, cyanosis, or edema MS- no significant deformity or atrophy Skin- no rash or lesion Psych- euthymic mood, full affect Neuro- strength and sensation are intact  EKG-NSR at 92 bpm, pr int 152 ms, qrs int 94 ms, qtc 487 ms Event monitor strips reviewed and appear to be  Paroxysmal aflutter at 150 bpm    Assessment and Plan: 1. ? Possible atrial flutter by monitor strips She appears to be minimally symptomatic at this time Continue to wear event monitor and would like to review in its completion to see burden chadsvasc score of 3 Pt cannot tolerate rate control for hypotension  associated with diaysis Warfarin would not be tolerated per pt and mother due  to open skin lesions which are chronic and constantly have bloody drainage I would hate to commit her to amiodarone at this point due to potential side effects If possibe typical flutter could possibly be ablated  If symptoms for sleep apnea consider for test Can rx prn cardizem 30 mg for  episodes of racing   This is a very complicated pt with limited treatment options. Discussed with Dr. Rayann Heman with above plan

## 2017-03-10 DIAGNOSIS — D631 Anemia in chronic kidney disease: Secondary | ICD-10-CM | POA: Diagnosis not present

## 2017-03-10 DIAGNOSIS — N186 End stage renal disease: Secondary | ICD-10-CM | POA: Diagnosis not present

## 2017-03-10 DIAGNOSIS — N2581 Secondary hyperparathyroidism of renal origin: Secondary | ICD-10-CM | POA: Diagnosis not present

## 2017-03-10 DIAGNOSIS — L732 Hidradenitis suppurativa: Secondary | ICD-10-CM | POA: Diagnosis not present

## 2017-03-10 DIAGNOSIS — E1129 Type 2 diabetes mellitus with other diabetic kidney complication: Secondary | ICD-10-CM | POA: Diagnosis not present

## 2017-03-10 DIAGNOSIS — D509 Iron deficiency anemia, unspecified: Secondary | ICD-10-CM | POA: Diagnosis not present

## 2017-03-12 DIAGNOSIS — L732 Hidradenitis suppurativa: Secondary | ICD-10-CM | POA: Diagnosis not present

## 2017-03-12 DIAGNOSIS — E1129 Type 2 diabetes mellitus with other diabetic kidney complication: Secondary | ICD-10-CM | POA: Diagnosis not present

## 2017-03-12 DIAGNOSIS — D509 Iron deficiency anemia, unspecified: Secondary | ICD-10-CM | POA: Diagnosis not present

## 2017-03-12 DIAGNOSIS — N2581 Secondary hyperparathyroidism of renal origin: Secondary | ICD-10-CM | POA: Diagnosis not present

## 2017-03-12 DIAGNOSIS — N186 End stage renal disease: Secondary | ICD-10-CM | POA: Diagnosis not present

## 2017-03-12 DIAGNOSIS — D631 Anemia in chronic kidney disease: Secondary | ICD-10-CM | POA: Diagnosis not present

## 2017-03-14 DIAGNOSIS — L732 Hidradenitis suppurativa: Secondary | ICD-10-CM | POA: Diagnosis not present

## 2017-03-14 DIAGNOSIS — D509 Iron deficiency anemia, unspecified: Secondary | ICD-10-CM | POA: Diagnosis not present

## 2017-03-14 DIAGNOSIS — D631 Anemia in chronic kidney disease: Secondary | ICD-10-CM | POA: Diagnosis not present

## 2017-03-14 DIAGNOSIS — E1129 Type 2 diabetes mellitus with other diabetic kidney complication: Secondary | ICD-10-CM | POA: Diagnosis not present

## 2017-03-14 DIAGNOSIS — N186 End stage renal disease: Secondary | ICD-10-CM | POA: Diagnosis not present

## 2017-03-14 DIAGNOSIS — N2581 Secondary hyperparathyroidism of renal origin: Secondary | ICD-10-CM | POA: Diagnosis not present

## 2017-03-16 DIAGNOSIS — N186 End stage renal disease: Secondary | ICD-10-CM | POA: Diagnosis not present

## 2017-03-16 DIAGNOSIS — D631 Anemia in chronic kidney disease: Secondary | ICD-10-CM | POA: Diagnosis not present

## 2017-03-16 DIAGNOSIS — L732 Hidradenitis suppurativa: Secondary | ICD-10-CM | POA: Diagnosis not present

## 2017-03-16 DIAGNOSIS — E1129 Type 2 diabetes mellitus with other diabetic kidney complication: Secondary | ICD-10-CM | POA: Diagnosis not present

## 2017-03-16 DIAGNOSIS — N2581 Secondary hyperparathyroidism of renal origin: Secondary | ICD-10-CM | POA: Diagnosis not present

## 2017-03-16 DIAGNOSIS — D509 Iron deficiency anemia, unspecified: Secondary | ICD-10-CM | POA: Diagnosis not present

## 2017-03-19 DIAGNOSIS — N186 End stage renal disease: Secondary | ICD-10-CM | POA: Diagnosis not present

## 2017-03-19 DIAGNOSIS — L732 Hidradenitis suppurativa: Secondary | ICD-10-CM | POA: Diagnosis not present

## 2017-03-19 DIAGNOSIS — N2581 Secondary hyperparathyroidism of renal origin: Secondary | ICD-10-CM | POA: Diagnosis not present

## 2017-03-19 DIAGNOSIS — D631 Anemia in chronic kidney disease: Secondary | ICD-10-CM | POA: Diagnosis not present

## 2017-03-19 DIAGNOSIS — E1129 Type 2 diabetes mellitus with other diabetic kidney complication: Secondary | ICD-10-CM | POA: Diagnosis not present

## 2017-03-19 DIAGNOSIS — D509 Iron deficiency anemia, unspecified: Secondary | ICD-10-CM | POA: Diagnosis not present

## 2017-03-21 DIAGNOSIS — L732 Hidradenitis suppurativa: Secondary | ICD-10-CM | POA: Diagnosis not present

## 2017-03-21 DIAGNOSIS — N186 End stage renal disease: Secondary | ICD-10-CM | POA: Diagnosis not present

## 2017-03-21 DIAGNOSIS — D631 Anemia in chronic kidney disease: Secondary | ICD-10-CM | POA: Diagnosis not present

## 2017-03-21 DIAGNOSIS — E1129 Type 2 diabetes mellitus with other diabetic kidney complication: Secondary | ICD-10-CM | POA: Diagnosis not present

## 2017-03-21 DIAGNOSIS — N2581 Secondary hyperparathyroidism of renal origin: Secondary | ICD-10-CM | POA: Diagnosis not present

## 2017-03-21 DIAGNOSIS — D509 Iron deficiency anemia, unspecified: Secondary | ICD-10-CM | POA: Diagnosis not present

## 2017-03-22 ENCOUNTER — Ambulatory Visit (INDEPENDENT_AMBULATORY_CARE_PROVIDER_SITE_OTHER): Payer: Medicare Other | Admitting: Infectious Disease

## 2017-03-22 ENCOUNTER — Encounter: Payer: Self-pay | Admitting: Infectious Disease

## 2017-03-22 VITALS — BP 116/68 | HR 102 | Temp 98.6°F

## 2017-03-22 DIAGNOSIS — Z992 Dependence on renal dialysis: Secondary | ICD-10-CM

## 2017-03-22 DIAGNOSIS — N186 End stage renal disease: Secondary | ICD-10-CM

## 2017-03-22 DIAGNOSIS — J42 Unspecified chronic bronchitis: Secondary | ICD-10-CM

## 2017-03-22 DIAGNOSIS — L732 Hidradenitis suppurativa: Secondary | ICD-10-CM | POA: Diagnosis not present

## 2017-03-22 MED ORDER — CEPHALEXIN 500 MG PO CAPS: 500.0000 mg | ORAL_CAPSULE | Freq: Two times a day (BID) | ORAL | 0 refills | Status: DC

## 2017-03-22 MED ORDER — METRONIDAZOLE 500 MG PO TABS: 500.0000 mg | ORAL_TABLET | Freq: Three times a day (TID) | ORAL | 1 refills | Status: DC

## 2017-03-22 NOTE — Progress Notes (Signed)
Subjective:   Chief complaint :follow-up for  HS   Patient ID: [name], female    DOB: 01-22-1961, 56 y.o.   MRN: [id number]  HPI  [name] is a 56 year old lady with morbid obesity hidradenitis with chronic kidney disease on hemodialysis who developed streptococcal bacteremia in September 2017when she was an inpatient and whom we treated with vancomycin for streptococcal bacteremia along with ceftaz edema and oral Flagyl to cover her at [facility] which was flaring. After she completed the IV antibiotics for hidradenitis flared again and her antibiotics were extended by Dr. Megan Salon after he saw her in clinic.  When I last saw her we changed her over to oral Bactrim with the hopes that this would provide some further coverage for organisms involved with her infection of her hidradenitis lesions. Unfortunate she developed a sensation of hearing music in both of her ears constantly even when no music was playing. This was prominent while she was taking her Bactrim but has subsided since she has stopped that and change over to doxycycline but is not completely disappeared. She has seen her audiologist but apparently not discuss this with them. It is not clear to me that her hearing has been tested while she's had this symptom.  We changed  her over to doxycycline but since then she has experienced worsening drainage from 3 areas in particular to on her left buttocks. They're quite painful and cause her to feel uncomfortable sitting.  We took cultures but these grew only corynebacterium though GNR was seen on GS. I continued doxy and added levaquin in February. She was admitted on 02/02/17 with fever, leukocytosis, SIRS, started on vanco/zosyn. She apparently refused surgical consultation.   She was changed to vancomycin and Test team for ease of dosing with dialysis and she is feeling much better with no more fevers and improvement in her drainage posterior buttocks also in her inguinal area anteriorly.  She did not want me to examine these wounds at last visit in March of 2018.  In the interim she experienced increased drainage from her perineum including ulcers on buttocks and from groin.  We gave her 2 week course of IV vancomycin with HD with improvement in drainage but as soon as it was stopped she experienced recurrence of bleeding, foul smelling drainge from these areas.     Past Medical History:  Diagnosis Date  . Chronic diastolic CHF (congestive heart failure) ([place])   . Chronic kidney disease (CKD), stage III (moderate)    [name] nephrology lov note 05-12-2013 on pt chart now on HD  . DCM (dilated cardiomyopathy) ([place])    nonischemic - EF now 50-55% by echo 2014  . Depression   . DM type 2 (diabetes mellitus, type 2) ([facility])    diet controlledwith retinopathy and nephropathy  . GERD (gastroesophageal reflux disease)   . H/O cardiac arrest 10/2012  . Hard of hearing 10/2012   now wears 2 hearing aids  . History of hemodialysis dec 2013  . HTN (hypertension)   . Hydradenitis   . Hyperkalemia   . Hyperlipidemia   . Hypothyroidism   . Iron deficiency anemia   . Morbid obesity ([place])   . Multinodular goiter   . Pneumonia Nov 10, 2012  . Sleep apnea    no test yet per mother 10/22/13   . Tinnitus 12/26/2016  . Urinary tract infection    taking antibiotics for 3 days prior to surgery    Past Surgical History:  Procedure  Laterality Date  . AV FISTULA PLACEMENT Left 07/25/2013   Procedure: ARTERIOVENOUS (AV) FISTULA CREATION ;  Surgeon: Rosetta Posner, MD;  Location: [facility];  Service: Vascular;  Laterality: Left;  . CYSTOSCOPY W/ RETROGRADES  11/16/2012   Procedure: CYSTOSCOPY WITH RETROGRADE PYELOGRAM;  Surgeon: Reece Packer, MD;  [facility];  Service: Urology;  Laterality: Bilateral;  CYSTOSCOPY,BILATERAL RETROGRADE PYELOGRAM/ REMOVAL LEFT URETERAL STENT/ FULGERATION BLADDER MUCOSA/ INSERTION RIGHT URETERAL STENT  . CYSTOSCOPY W/ URETERAL STENT PLACEMENT   05/28/2012   Procedure: CYSTOSCOPY WITH RETROGRADE PYELOGRAM/URETERAL STENT PLACEMENT;  Surgeon: Reece Packer, MD;  [facility];  Service: Urology;  Laterality: Left;  . CYSTOSCOPY WITH RETROGRADE PYELOGRAM, URETEROSCOPY AND STENT PLACEMENT Right 06/23/2013   Procedure: CYSTOSCOPY WITH RIGHT URETEROSCOPY, RIGHT  RETROGRADE PYELOGRAM, WITH LASER LIPOTRIPSY AND RIGHT URETERAL STENT EXCHANGE ;  Surgeon: Molli Hazard, MD;  [facility];  Service: Urology;  Laterality: Right;  STENT EXCHANGE    . CYSTOSCOPY WITH RETROGRADE PYELOGRAM, URETEROSCOPY AND STENT PLACEMENT Right 11/06/2013   Procedure: CYSTOSCOPY WITH RETROGRADE PYELOGRAM, URETEROSCOPY STONE REMOVAL AND STENT REMOVAL;  Surgeon: Molli Hazard, MD;  [facility];  Service: Urology;  Laterality: Right;  . HOLMIUM LASER APPLICATION N/A 01/10/864   Procedure: HOLMIUM LASER APPLICATION;  Surgeon: Molli Hazard, MD;  [facility];  Service: Urology;  Laterality: N/A;  . INSERTION OF DIALYSIS CATHETER N/A 09/24/2013   Procedure: INSERTION OF DIALYSIS CATHETER;  Surgeon: Rosetta Posner, MD;  Location: [place];  Service: Vascular;  Laterality: N/A;  . PORT-A-CATH REMOVAL    . PORTACATH PLACEMENT    . REVISON OF ARTERIOVENOUS FISTULA Left 07/25/2016   Procedure: REVISON OF LEFT ARTERIOVENOUS FISTULA;  Surgeon: Elam Dutch, MD;  [facility];  Service: Vascular;  Laterality: Left;    Family History  Problem Relation Age of Onset  . Coronary artery disease Mother   . Hypertension Mother   . Diabetes type II Mother   . Coronary artery disease Father   . Hypertension Father   . Malignant hyperthermia Father   . Cancer Maternal Grandfather     ? Type  . Kidney failure Maternal Grandmother       Social History   Social History  . Marital status: Married    Spouse name: N/A  . Number of children: N/A  . Years of education: N/A   Social History Main Topics  . Smoking status: Former Smoker     Packs/day: 0.25    Years: 1.00    Types: Cigarettes    Quit date: 11/28/1979  . Smokeless tobacco: Never Used  . Alcohol use No  . Drug use: No  . Sexual activity: No   Other Topics Concern  . None   Social History Narrative  . None    Allergies  Allergen Reactions  . Amoxicillin Rash and Other (See Comments)    Tolerated Zosyn 01/2013. [place]  . Amoxicillin-Pot Clavulanate Diarrhea     Current Outpatient Prescriptions:  .  aspirin EC 81 MG tablet, Take 81 mg by mouth at bedtime. , Disp: , Rfl:  .  Azelastine-Fluticasone 137-50 MCG/ACT SUSP, Place 2 sprays into the nose daily., Disp: 1 Bottle, Rfl: 3 .  Calcium Acetate 667 MG TABS, Take 667 mg by mouth 3 (three) times daily with meals. And snacks, Disp: , Rfl:  .  clindamycin (CLEOCIN T) 1 % external solution, Apply 1 application topically 2 (two) times daily. , Disp: , Rfl:  .  diltiazem (CARDIZEM) 30 MG tablet, Take 1 tablet every 4 hours AS NEEDED for rapid heart rate lasting more than 30 mins as long as top blood pressure >100., Disp: 30 tablet, Rfl: 2 .  DULoxetine (CYMBALTA) 60 MG capsule, Take 1 capsule (60 mg total) by mouth daily., Disp: 30 capsule, Rfl: 0 .  famotidine (PEPCID) 20 MG tablet, TAKE 1 TABLET BY MOUTH AT BEDTIME (Patient taking differently: Take 20 mg by mouth at bedtime), Disp: 30 tablet, Rfl: 0 .  feeding supplement, RESOURCE BREEZE, (RESOURCE BREEZE) LIQD, Take 2 Containers by mouth 2 (two) times daily between meals. , Disp: , Rfl:  .  glipiZIDE (GLUCOTROL) 5 MG tablet, Take 0.5 tablets (2.5 mg total) by mouth daily., Disp: 30 tablet, Rfl: 0 .  levothyroxine (SYNTHROID, LEVOTHROID) 75 MCG tablet, Take 75 mcg by mouth daily., Disp: , Rfl: 0 .  midodrine (PROAMATINE) 10 MG tablet, Take 1 tablet (10 mg total) by mouth 3 (three) times daily. (Patient taking differently: Take 10 mg by mouth every Monday, Wednesday, and Friday. Dialysis days), Disp: 90 tablet, Rfl: 0 .  multivitamin (RENA-VIT) TABS tablet,  Take 1 tablet by mouth daily., Disp: , Rfl: 6 .  oxyCODONE-acetaminophen (PERCOCET/ROXICET) 5-325 MG tablet, Take 1-2 tablets by mouth every 6 (six) hours as needed. Patient has not taken since started dialysis 08/2013 due to causes hypotension (Patient taking differently: Take 1-2 tablets by mouth every 6 (six) hours as needed for moderate pain. ), Disp: 6 tablet, Rfl: 0 .  pravastatin (PRAVACHOL) 40 MG tablet, Take 40 mg by mouth daily. Patient takes in am, Disp: , Rfl:  .  Probiotic Product (PROBIOTIC DAILY PO), Take 1 tablet by mouth daily. , Disp: , Rfl:  .  sevelamer carbonate (RENVELA) 800 MG tablet, Take 2,400 mg by mouth 3 (three) times daily with meals., Disp: , Rfl:  .  sucroferric oxyhydroxide (VELPHORO) 500 MG chewable tablet, Chew 500 mg by mouth 3 (three) times daily with meals., Disp: , Rfl:  .  vancomycin (VANCOCIN) 1-5 GM/200ML-% SOLN, Inject 200 mLs (1,000 mg total) into the vein every Monday, Wednesday, and Friday with hemodialysis., Disp: 6000 mL, Rfl: 0 .  cephALEXin (KEFLEX) 500 MG capsule, Take 1 capsule (500 mg total) by mouth 2 (two) times daily., Disp: 60 capsule, Rfl: 0 .  metroNIDAZOLE (FLAGYL) 500 MG tablet, Take 1 tablet (500 mg total) by mouth 3 (three) times daily., Disp: 90 tablet, Rfl: 1    Review of Systems  Constitutional: Negative for chills, diaphoresis and fever.  HENT: Negative for congestion, hearing loss, sore throat and tinnitus.   Respiratory: Positive for shortness of breath. Negative for cough and wheezing.   Cardiovascular: Negative for chest pain and leg swelling.  Gastrointestinal: Negative for abdominal pain, blood in stool, constipation, diarrhea, nausea and vomiting.  Genitourinary: Negative for dysuria, flank pain and hematuria.  Musculoskeletal: Negative for back pain and myalgias.  Skin: Positive for color change and wound.  Neurological: Negative for dizziness, weakness and headaches.  Hematological: Does not bruise/bleed easily.    Psychiatric/Behavioral: Negative for suicidal ideas. The patient is nervous/anxious.        Objective:   Physical Exam  Constitutional: She is oriented to person, place, and time. She appears well-developed and well-nourished. No distress.  HENT:  Head: Normocephalic and atraumatic.  Eyes: Conjunctivae and EOM are normal. No scleral icterus.  Neck: Normal range of motion. Neck supple.  Cardiovascular: Normal rate.  An irregularly irregular rhythm present.  Pulmonary/Chest: Effort normal.  No respiratory distress. She has no wheezes.  Abdominal: She exhibits no distension.  Musculoskeletal: She exhibits edema.  Neurological: She is alert and oriented to person, place, and time. Coordination normal.  Skin: Skin is warm and dry. She is not diaphoretic.  Psychiatric: She has a normal mood and affect. Her behavior is normal. Judgment and thought content normal.    Right groin lesion 11/09/2016:     Buttocks wounds today on 12/26/2016     Left lower buttocks 12/26/16:    Exam on April 26th, 2018:  Right buttocks     Left buttocks    Left buttocks   Groin            Assessment & Plan:   Hidradenitis suppurativa:   Drainage worse again post stopping IV vancomycin  Try keflex and flagyl   It is too bad there is apparently no local surgeon that can help her or at [facility] in terms of more definitive therapy. Same thing in the world of Dermatology it appears  Morbid obesity  ESRD on HD  Streptococcal bacteremia: Resolved  COPD: she did not bring her portable O2 but we supplied her with O2 while in clinic with tank hooked up to [place]  I spent greater than 25 minutes with the patient including greater than 50% of time in face to face counsel of the patient and her mother with regards to her hidradenitis her end-stage renal disease on dialysis and in coordination of her care.

## 2017-03-23 DIAGNOSIS — D509 Iron deficiency anemia, unspecified: Secondary | ICD-10-CM | POA: Diagnosis not present

## 2017-03-23 DIAGNOSIS — N186 End stage renal disease: Secondary | ICD-10-CM | POA: Diagnosis not present

## 2017-03-23 DIAGNOSIS — N2581 Secondary hyperparathyroidism of renal origin: Secondary | ICD-10-CM | POA: Diagnosis not present

## 2017-03-23 DIAGNOSIS — E1129 Type 2 diabetes mellitus with other diabetic kidney complication: Secondary | ICD-10-CM | POA: Diagnosis not present

## 2017-03-23 DIAGNOSIS — L732 Hidradenitis suppurativa: Secondary | ICD-10-CM | POA: Diagnosis not present

## 2017-03-23 DIAGNOSIS — D631 Anemia in chronic kidney disease: Secondary | ICD-10-CM | POA: Diagnosis not present

## 2017-03-26 DIAGNOSIS — E1129 Type 2 diabetes mellitus with other diabetic kidney complication: Secondary | ICD-10-CM | POA: Diagnosis not present

## 2017-03-26 DIAGNOSIS — N2581 Secondary hyperparathyroidism of renal origin: Secondary | ICD-10-CM | POA: Diagnosis not present

## 2017-03-26 DIAGNOSIS — Z992 Dependence on renal dialysis: Secondary | ICD-10-CM | POA: Diagnosis not present

## 2017-03-26 DIAGNOSIS — D631 Anemia in chronic kidney disease: Secondary | ICD-10-CM | POA: Diagnosis not present

## 2017-03-26 DIAGNOSIS — D509 Iron deficiency anemia, unspecified: Secondary | ICD-10-CM | POA: Diagnosis not present

## 2017-03-26 DIAGNOSIS — N186 End stage renal disease: Secondary | ICD-10-CM | POA: Diagnosis not present

## 2017-03-26 DIAGNOSIS — L732 Hidradenitis suppurativa: Secondary | ICD-10-CM | POA: Diagnosis not present

## 2017-03-27 DIAGNOSIS — H903 Sensorineural hearing loss, bilateral: Secondary | ICD-10-CM | POA: Diagnosis not present

## 2017-03-28 DIAGNOSIS — D509 Iron deficiency anemia, unspecified: Secondary | ICD-10-CM | POA: Diagnosis not present

## 2017-03-28 DIAGNOSIS — E1129 Type 2 diabetes mellitus with other diabetic kidney complication: Secondary | ICD-10-CM | POA: Diagnosis not present

## 2017-03-28 DIAGNOSIS — D631 Anemia in chronic kidney disease: Secondary | ICD-10-CM | POA: Diagnosis not present

## 2017-03-28 DIAGNOSIS — N2581 Secondary hyperparathyroidism of renal origin: Secondary | ICD-10-CM | POA: Diagnosis not present

## 2017-03-28 DIAGNOSIS — N186 End stage renal disease: Secondary | ICD-10-CM | POA: Diagnosis not present

## 2017-03-30 DIAGNOSIS — D631 Anemia in chronic kidney disease: Secondary | ICD-10-CM | POA: Diagnosis not present

## 2017-03-30 DIAGNOSIS — D509 Iron deficiency anemia, unspecified: Secondary | ICD-10-CM | POA: Diagnosis not present

## 2017-03-30 DIAGNOSIS — N186 End stage renal disease: Secondary | ICD-10-CM | POA: Diagnosis not present

## 2017-03-30 DIAGNOSIS — E1129 Type 2 diabetes mellitus with other diabetic kidney complication: Secondary | ICD-10-CM | POA: Diagnosis not present

## 2017-03-30 DIAGNOSIS — N2581 Secondary hyperparathyroidism of renal origin: Secondary | ICD-10-CM | POA: Diagnosis not present

## 2017-04-02 DIAGNOSIS — D631 Anemia in chronic kidney disease: Secondary | ICD-10-CM | POA: Diagnosis not present

## 2017-04-02 DIAGNOSIS — N2581 Secondary hyperparathyroidism of renal origin: Secondary | ICD-10-CM | POA: Diagnosis not present

## 2017-04-02 DIAGNOSIS — E1129 Type 2 diabetes mellitus with other diabetic kidney complication: Secondary | ICD-10-CM | POA: Diagnosis not present

## 2017-04-02 DIAGNOSIS — D509 Iron deficiency anemia, unspecified: Secondary | ICD-10-CM | POA: Diagnosis not present

## 2017-04-02 DIAGNOSIS — N186 End stage renal disease: Secondary | ICD-10-CM | POA: Diagnosis not present

## 2017-04-04 DIAGNOSIS — N2581 Secondary hyperparathyroidism of renal origin: Secondary | ICD-10-CM | POA: Diagnosis not present

## 2017-04-04 DIAGNOSIS — D509 Iron deficiency anemia, unspecified: Secondary | ICD-10-CM | POA: Diagnosis not present

## 2017-04-04 DIAGNOSIS — N186 End stage renal disease: Secondary | ICD-10-CM | POA: Diagnosis not present

## 2017-04-04 DIAGNOSIS — D631 Anemia in chronic kidney disease: Secondary | ICD-10-CM | POA: Diagnosis not present

## 2017-04-04 DIAGNOSIS — E1129 Type 2 diabetes mellitus with other diabetic kidney complication: Secondary | ICD-10-CM | POA: Diagnosis not present

## 2017-04-06 ENCOUNTER — Ambulatory Visit: Payer: Medicare Other | Admitting: Cardiology

## 2017-04-06 DIAGNOSIS — D509 Iron deficiency anemia, unspecified: Secondary | ICD-10-CM | POA: Diagnosis not present

## 2017-04-06 DIAGNOSIS — N186 End stage renal disease: Secondary | ICD-10-CM | POA: Diagnosis not present

## 2017-04-06 DIAGNOSIS — D631 Anemia in chronic kidney disease: Secondary | ICD-10-CM | POA: Diagnosis not present

## 2017-04-06 DIAGNOSIS — E1129 Type 2 diabetes mellitus with other diabetic kidney complication: Secondary | ICD-10-CM | POA: Diagnosis not present

## 2017-04-06 DIAGNOSIS — N2581 Secondary hyperparathyroidism of renal origin: Secondary | ICD-10-CM | POA: Diagnosis not present

## 2017-04-09 DIAGNOSIS — E1129 Type 2 diabetes mellitus with other diabetic kidney complication: Secondary | ICD-10-CM | POA: Diagnosis not present

## 2017-04-09 DIAGNOSIS — D509 Iron deficiency anemia, unspecified: Secondary | ICD-10-CM | POA: Diagnosis not present

## 2017-04-09 DIAGNOSIS — N2581 Secondary hyperparathyroidism of renal origin: Secondary | ICD-10-CM | POA: Diagnosis not present

## 2017-04-09 DIAGNOSIS — N186 End stage renal disease: Secondary | ICD-10-CM | POA: Diagnosis not present

## 2017-04-09 DIAGNOSIS — D631 Anemia in chronic kidney disease: Secondary | ICD-10-CM | POA: Diagnosis not present

## 2017-04-10 ENCOUNTER — Encounter: Payer: Self-pay | Admitting: Cardiology

## 2017-04-10 ENCOUNTER — Ambulatory Visit (INDEPENDENT_AMBULATORY_CARE_PROVIDER_SITE_OTHER): Payer: Medicare Other | Admitting: Cardiology

## 2017-04-10 VITALS — BP 130/72 | HR 92 | Ht 67.0 in | Wt 380.0 lb

## 2017-04-10 DIAGNOSIS — I483 Typical atrial flutter: Secondary | ICD-10-CM | POA: Diagnosis not present

## 2017-04-10 DIAGNOSIS — I481 Persistent atrial fibrillation: Secondary | ICD-10-CM | POA: Diagnosis not present

## 2017-04-10 DIAGNOSIS — I420 Dilated cardiomyopathy: Secondary | ICD-10-CM | POA: Diagnosis not present

## 2017-04-10 DIAGNOSIS — I10 Essential (primary) hypertension: Secondary | ICD-10-CM

## 2017-04-10 DIAGNOSIS — I4819 Other persistent atrial fibrillation: Secondary | ICD-10-CM

## 2017-04-10 DIAGNOSIS — I5032 Chronic diastolic (congestive) heart failure: Secondary | ICD-10-CM | POA: Diagnosis not present

## 2017-04-10 HISTORY — DX: Typical atrial flutter: I48.3

## 2017-04-10 NOTE — Progress Notes (Signed)
Cardiology Office Note    Date:  04/10/2017   ID:  [name], DOB Apr 24, 1961, MRN [id number]  PCP:  Harlan Stains, MD  Cardiologist:  Fransico Him, MD   Chief Complaint  Patient presents with  . Atrial Flutter  . Cardiomyopathy  . Congestive Heart Failure    History of Present Illness:  [name] is a 56 y.o. female with a h/o ESRD on dilaysis, dilated cardiomyopathy, now with normalized EF at 50-55%, DM, morbid obesity,chronic diastolic HF,chronically low BP's, chronic shortness of breath with COPD with 02 at night. She also has h/o of hydradenitis suppurativa. She was just hospitalized for sepsis due to infected hydaenitis areas. She was given antibiotics. She also was noted to have tachycardia and was given dilt IV push.Low dose coreg was started and holter monitor was placed. Chadsvasc score is 3. Recommended if recurrent afib, consider warfarin.  She was recently seen in [facility] for f/u. Strips from her event monitor showed  atrial flutter at v rate of 150 bpm. Pt will feel irregular heart beat only for a few minutes when she is trying to go to bed at night.  She cannot tolerate rate control for issues with hypotension with dialysis. Pt states that she would not be able to tolerate coumdain for her open skin lesions 2/2 hydradenitis that she hs on her groin and buttocks that are associated with constant bleeding/oozing of same. This is a chronic issue and will not get better.  She is here back today for further evaluation.  She has not had any more palpitations since being seen in afib clinic and wearing the heart monitor.  She says that she is feeling good.  She has chronic SOB that is very stable.  She denies any LE edema, chest pain or pressure, dizziness, syncope, PND or orthopnea.      Past Medical History:  Diagnosis Date  . Chronic diastolic CHF (congestive heart failure) ([place])   . Chronic kidney disease (CKD), stage III (moderate)    [name] nephrology lov  note 05-12-2013 on pt chart now on HD  . DCM (dilated cardiomyopathy) ([place])    nonischemic - EF now 60-65% by echo 2017  . Depression   . DM type 2 (diabetes mellitus, type 2) ([facility])    diet controlledwith retinopathy and nephropathy  . GERD (gastroesophageal reflux disease)   . H/O cardiac arrest 10/2012  . Hard of hearing 10/2012   now wears 2 hearing aids  . History of hemodialysis dec 2013  . HTN (hypertension)   . Hydradenitis   . Hyperkalemia   . Hyperlipidemia   . Hypothyroidism   . Iron deficiency anemia   . Morbid obesity ([place])   . Multinodular goiter   . Pneumonia Nov 10, 2012  . Sleep apnea    no test yet per mother 10/22/13   . Tinnitus 12/26/2016  . Typical atrial flutter ([place]) 04/10/2017  . Urinary tract infection    taking antibiotics for 3 days prior to surgery    Past Surgical History:  Procedure Laterality Date  . AV FISTULA PLACEMENT Left 07/25/2013   Procedure: ARTERIOVENOUS (AV) FISTULA CREATION ;  Surgeon: Rosetta Posner, MD;  Location: [facility];  Service: Vascular;  Laterality: Left;  . CYSTOSCOPY W/ RETROGRADES  11/16/2012   Procedure: CYSTOSCOPY WITH RETROGRADE PYELOGRAM;  Surgeon: Reece Packer, MD;  Location: [facility];  Service: Urology;  Laterality: Bilateral;  CYSTOSCOPY,BILATERAL RETROGRADE PYELOGRAM/ REMOVAL LEFT URETERAL STENT/ FULGERATION BLADDER MUCOSA/ INSERTION  RIGHT URETERAL STENT  . CYSTOSCOPY W/ URETERAL STENT PLACEMENT  05/28/2012   Procedure: CYSTOSCOPY WITH RETROGRADE PYELOGRAM/URETERAL STENT PLACEMENT;  Surgeon: Reece Packer, MD;  Location: [facility];  Service: Urology;  Laterality: Left;  . CYSTOSCOPY WITH RETROGRADE PYELOGRAM, URETEROSCOPY AND STENT PLACEMENT Right 06/23/2013   Procedure: CYSTOSCOPY WITH RIGHT URETEROSCOPY, RIGHT  RETROGRADE PYELOGRAM, WITH LASER LIPOTRIPSY AND RIGHT URETERAL STENT EXCHANGE ;  Surgeon: Molli Hazard, MD;  Location: [facility];  Service: Urology;  Laterality: Right;  STENT EXCHANGE    . CYSTOSCOPY WITH  RETROGRADE PYELOGRAM, URETEROSCOPY AND STENT PLACEMENT Right 11/06/2013   Procedure: CYSTOSCOPY WITH RETROGRADE PYELOGRAM, URETEROSCOPY STONE REMOVAL AND STENT REMOVAL;  Surgeon: Molli Hazard, MD;  Location: [facility];  Service: Urology;  Laterality: Right;  . HOLMIUM LASER APPLICATION N/A 2/58/5277   Procedure: HOLMIUM LASER APPLICATION;  Surgeon: Molli Hazard, MD;  Location: [facility];  Service: Urology;  Laterality: N/A;  . INSERTION OF DIALYSIS CATHETER N/A 09/24/2013   Procedure: INSERTION OF DIALYSIS CATHETER;  Surgeon: Rosetta Posner, MD;  Location: [facility];  Service: Vascular;  Laterality: N/A;  . PORT-A-CATH REMOVAL    . PORTACATH PLACEMENT    . REVISON OF ARTERIOVENOUS FISTULA Left 07/25/2016   Procedure: REVISON OF LEFT ARTERIOVENOUS FISTULA;  Surgeon: Elam Dutch, MD;  Location: [facility];  Service: Vascular;  Laterality: Left;    Current Medications: Current Meds  Medication Sig  . aspirin EC 81 MG tablet Take 81 mg by mouth at bedtime.   . Azelastine-Fluticasone 137-50 MCG/ACT SUSP Place 2 sprays into the nose daily.  . Calcium Acetate 667 MG TABS Take 667 mg by mouth 3 (three) times daily with meals. And snacks  . cephALEXin (KEFLEX) 500 MG capsule Take 1 capsule (500 mg total) by mouth 2 (two) times daily.  . clindamycin (CLEOCIN T) 1 % external solution Apply 1 application topically 2 (two) times daily.   [place] diltiazem (CARDIZEM) 30 MG tablet Take 1 tablet every 4 hours AS NEEDED for rapid heart rate lasting more than 30 mins as long as top blood pressure >100.  [place] DULoxetine (CYMBALTA) 60 MG capsule Take 1 capsule (60 mg total) by mouth daily.  . famotidine (PEPCID) 20 MG tablet TAKE 1 TABLET BY MOUTH AT BEDTIME (Patient taking differently: Take 20 mg by mouth at bedtime)  . feeding supplement, RESOURCE BREEZE, (RESOURCE BREEZE) LIQD Take 2 Containers by mouth 2 (two) times daily between meals.   [place] Kitchen glipiZIDE (GLUCOTROL) 5 MG tablet Take 0.5 tablets (2.5 mg total) by  mouth daily.  [place] Kitchen levothyroxine (SYNTHROID, LEVOTHROID) 75 MCG tablet Take 75 mcg by mouth daily.  . metroNIDAZOLE (FLAGYL) 500 MG tablet Take 1 tablet (500 mg total) by mouth 3 (three) times daily.  . midodrine (PROAMATINE) 10 MG tablet Take 1 tablet (10 mg total) by mouth 3 (three) times daily. (Patient taking differently: Take 10 mg by mouth every Monday, Wednesday, and Friday. Dialysis days)  . multivitamin (RENA-VIT) TABS tablet Take 1 tablet by mouth daily.  [place] Kitchen oxyCODONE-acetaminophen (PERCOCET/ROXICET) 5-325 MG tablet Take 1-2 tablets by mouth every 6 (six) hours as needed. Patient has not taken since started dialysis 08/2013 due to causes hypotension (Patient taking differently: Take 1-2 tablets by mouth every 6 (six) hours as needed for moderate pain. )  . pravastatin (PRAVACHOL) 40 MG tablet Take 40 mg by mouth daily. Patient takes in am  . Probiotic Product (PROBIOTIC DAILY PO) Take 1 tablet by mouth daily.   [place] Kitchen  sevelamer carbonate (RENVELA) 800 MG tablet Take 2,400 mg by mouth 3 (three) times daily with meals.  . sucroferric oxyhydroxide (VELPHORO) 500 MG chewable tablet Chew 500 mg by mouth 3 (three) times daily with meals.  . vancomycin (VANCOCIN) 1-5 GM/200ML-% SOLN Inject 200 mLs (1,000 mg total) into the vein every Monday, Wednesday, and Friday with hemodialysis.    Allergies:   Rosiglitazone; Amoxicillin; and Amoxicillin-pot clavulanate   Social History   Social History  . Marital status: Married    Spouse name: N/A  . Number of children: N/A  . Years of education: N/A   Social History Main Topics  . Smoking status: Former Smoker    Packs/day: 0.25    Years: 1.00    Types: Cigarettes    Quit date: 11/28/1979  . Smokeless tobacco: Never Used  . Alcohol use No  . Drug use: No  . Sexual activity: No   Other Topics Concern  . None   Social History Narrative  . None     Family History:  The patient's family history includes Cancer in her maternal grandfather; Coronary  artery disease in her father and mother; Diabetes type II in her mother; Hypertension in her father and mother; Kidney failure in her maternal grandmother; Malignant hyperthermia in her father.   ROS:   Please see the history of present illness.    ROS All other systems reviewed and are negative.  No flowsheet data found.     PHYSICAL EXAM:   VS:  BP 130/72   Pulse 92   Ht 5\' 7"  (1.702 m)   Wt (!) 380 lb (172.4 kg) Comment: Pt stated couldnt stand to weigh and stated weight to me  LMP 06/13/2013   SpO2 93%   BMI 59.52 kg/m    GEN: Well nourished, well developed, in no acute distress  HEENT: normal  Neck: no JVD, carotid bruits, or masses Cardiac: RRR; no murmurs, rubs, or gallops,no edema.  Intact distal pulses bilaterally.  Respiratory:  clear to auscultation bilaterally, normal work of breathing GI: soft, nontender, nondistended, + BS MS: no deformity or atrophy  Skin: warm and dry, no rash Neuro:  Alert and Oriented x 3, Strength and sensation are intact Psych: euthymic mood, full affect  Wt Readings from Last 3 Encounters:  04/10/17 (!) 380 lb (172.4 kg)  03/08/17 (!) 339 lb 3.2 oz (153.9 kg)  02/03/17 (!) 343 lb 3.2 oz (155.7 kg)      Studies/Labs Reviewed:   EKG:  EKG is not ordered today.    Recent Labs: 12/28/2016: NT-Pro BNP 12,642 02/02/2017: ALT 18; B Natriuretic Peptide 493.1 02/03/2017: BUN 74; Creatinine, Ser 10.45; Hemoglobin 10.3; Platelets 287; Potassium 5.0; Sodium 135   Lipid Panel    Component Value Date/Time   CHOL 80 01/20/2012 1639   TRIG 55 01/20/2012 1639   HDL 28 (L) 01/20/2012 1639   CHOLHDL 2.9 01/20/2012 1639   VLDL 11 01/20/2012 1639   LDLCALC 41 01/20/2012 1639    Additional studies/ records that were reviewed today include:  Office notes from [facility]    ASSESSMENT:    1. Chronic diastolic heart failure ([place])   2. DCM (dilated cardiomyopathy) ([place])   3. Essential hypertension   4. Morbid obesity ([place])   5. Persistent  atrial fibrillation ([place])   6. Typical atrial flutter ([facility])      PLAN:  In order of problems listed above:  1. Chronic diastolic CHF - her volume status is difficult to assess  due to morbid obesity but she has no SOB or LE edema.  Volume managed with HD. 2. DCM - EF has normalized on echo 2017. 3. HTN - Her BP is adequately controlled on exam today.  She is on no antihypertensives and is actually on midodrine for orthostatic hypotension.  4. Morbid obesity - she is very limited in her ability to exercise due to her hydradinitis 5. Persistent atrial fibrillation - she is maintaining NSR on exam today.  See #6. 6. Paroxysmal atrial flutter with RVR noted on recent event monitor for palpitations.  She was seen in afib clinic recently.  Unfortunately cannot place on CCB or BB due to issues with hypotension during HD.  Would like to avoid the potential side effects of Amio given her young age.  She was given Cardizem 30mg  QID PRN for breakthrough palpitations and has not had any further palpitations.  She is not a candidate for anticoagulation due to problems with constant bleeding from her skin wounds due to suppurative hydradinitis.      Medication Adjustments/Labs and Tests Ordered: Current medicines are reviewed at length with the patient today.  Concerns regarding medicines are outlined above.  Medication changes, Labs and Tests ordered today are listed in the Patient Instructions below.  Patient Instructions  Medication Instructions:  Your physician recommends that you continue on your current medications as directed. Please refer to the Current Medication list given to you today.   Labwork: None  Testing/Procedures: None  Follow-Up: Your physician wants you to follow-up in: 6 months with Dr. Theodosia Blender assistant. You will receive a reminder letter in the mail two months in advance. If you don't receive a letter, please call our office to schedule the follow-up appointment.   Your  physician wants you to follow-up in: 1 year with Dr. Radford Pax. You will receive a reminder letter in the mail two months in advance. If you don't receive a letter, please call our office to schedule the follow-up appointment.   Any Other Special Instructions Will Be Listed Below (If Applicable).     If you need a refill on your cardiac medications before your next appointment, please call your pharmacy.      Signed, Fransico Him, MD  04/10/2017 1:48 PM    [facility] [address], [address], [place]  [address] Phone: [phone]; Fax: [phone]

## 2017-04-10 NOTE — Patient Instructions (Signed)
Medication Instructions:  Your physician recommends that you continue on your current medications as directed. Please refer to the Current Medication list given to you today.   Labwork: None  Testing/Procedures: None  Follow-Up: Your physician wants you to follow-up in: 6 months with Dr. Theodosia Blender assistant. You will receive a reminder letter in the mail two months in advance. If you don't receive a letter, please call our office to schedule the follow-up appointment.   Your physician wants you to follow-up in: 1 year with Dr. Radford Pax. You will receive a reminder letter in the mail two months in advance. If you don't receive a letter, please call our office to schedule the follow-up appointment.   Any Other Special Instructions Will Be Listed Below (If Applicable).     If you need a refill on your cardiac medications before your next appointment, please call your pharmacy.

## 2017-04-11 DIAGNOSIS — D509 Iron deficiency anemia, unspecified: Secondary | ICD-10-CM | POA: Diagnosis not present

## 2017-04-11 DIAGNOSIS — N2581 Secondary hyperparathyroidism of renal origin: Secondary | ICD-10-CM | POA: Diagnosis not present

## 2017-04-11 DIAGNOSIS — E1129 Type 2 diabetes mellitus with other diabetic kidney complication: Secondary | ICD-10-CM | POA: Diagnosis not present

## 2017-04-11 DIAGNOSIS — N186 End stage renal disease: Secondary | ICD-10-CM | POA: Diagnosis not present

## 2017-04-11 DIAGNOSIS — D631 Anemia in chronic kidney disease: Secondary | ICD-10-CM | POA: Diagnosis not present

## 2017-04-13 DIAGNOSIS — D509 Iron deficiency anemia, unspecified: Secondary | ICD-10-CM | POA: Diagnosis not present

## 2017-04-13 DIAGNOSIS — E1129 Type 2 diabetes mellitus with other diabetic kidney complication: Secondary | ICD-10-CM | POA: Diagnosis not present

## 2017-04-13 DIAGNOSIS — D631 Anemia in chronic kidney disease: Secondary | ICD-10-CM | POA: Diagnosis not present

## 2017-04-13 DIAGNOSIS — N186 End stage renal disease: Secondary | ICD-10-CM | POA: Diagnosis not present

## 2017-04-13 DIAGNOSIS — N2581 Secondary hyperparathyroidism of renal origin: Secondary | ICD-10-CM | POA: Diagnosis not present

## 2017-04-16 DIAGNOSIS — N2581 Secondary hyperparathyroidism of renal origin: Secondary | ICD-10-CM | POA: Diagnosis not present

## 2017-04-16 DIAGNOSIS — D509 Iron deficiency anemia, unspecified: Secondary | ICD-10-CM | POA: Diagnosis not present

## 2017-04-16 DIAGNOSIS — D631 Anemia in chronic kidney disease: Secondary | ICD-10-CM | POA: Diagnosis not present

## 2017-04-16 DIAGNOSIS — E1129 Type 2 diabetes mellitus with other diabetic kidney complication: Secondary | ICD-10-CM | POA: Diagnosis not present

## 2017-04-16 DIAGNOSIS — N186 End stage renal disease: Secondary | ICD-10-CM | POA: Diagnosis not present

## 2017-04-17 DIAGNOSIS — E039 Hypothyroidism, unspecified: Secondary | ICD-10-CM | POA: Diagnosis not present

## 2017-04-17 DIAGNOSIS — Z992 Dependence on renal dialysis: Secondary | ICD-10-CM | POA: Diagnosis not present

## 2017-04-17 DIAGNOSIS — E785 Hyperlipidemia, unspecified: Secondary | ICD-10-CM | POA: Diagnosis not present

## 2017-04-17 DIAGNOSIS — I130 Hypertensive heart and chronic kidney disease with heart failure and stage 1 through stage 4 chronic kidney disease, or unspecified chronic kidney disease: Secondary | ICD-10-CM | POA: Diagnosis not present

## 2017-04-17 DIAGNOSIS — G894 Chronic pain syndrome: Secondary | ICD-10-CM | POA: Diagnosis not present

## 2017-04-17 DIAGNOSIS — F324 Major depressive disorder, single episode, in partial remission: Secondary | ICD-10-CM | POA: Diagnosis not present

## 2017-04-17 DIAGNOSIS — K219 Gastro-esophageal reflux disease without esophagitis: Secondary | ICD-10-CM | POA: Diagnosis not present

## 2017-04-17 DIAGNOSIS — I429 Cardiomyopathy, unspecified: Secondary | ICD-10-CM | POA: Diagnosis not present

## 2017-04-17 DIAGNOSIS — E1121 Type 2 diabetes mellitus with diabetic nephropathy: Secondary | ICD-10-CM | POA: Diagnosis not present

## 2017-04-17 DIAGNOSIS — N186 End stage renal disease: Secondary | ICD-10-CM | POA: Diagnosis not present

## 2017-04-17 DIAGNOSIS — L732 Hidradenitis suppurativa: Secondary | ICD-10-CM | POA: Diagnosis not present

## 2017-04-18 DIAGNOSIS — D631 Anemia in chronic kidney disease: Secondary | ICD-10-CM | POA: Diagnosis not present

## 2017-04-18 DIAGNOSIS — N2581 Secondary hyperparathyroidism of renal origin: Secondary | ICD-10-CM | POA: Diagnosis not present

## 2017-04-18 DIAGNOSIS — N186 End stage renal disease: Secondary | ICD-10-CM | POA: Diagnosis not present

## 2017-04-18 DIAGNOSIS — E1129 Type 2 diabetes mellitus with other diabetic kidney complication: Secondary | ICD-10-CM | POA: Diagnosis not present

## 2017-04-18 DIAGNOSIS — D509 Iron deficiency anemia, unspecified: Secondary | ICD-10-CM | POA: Diagnosis not present

## 2017-04-20 DIAGNOSIS — N186 End stage renal disease: Secondary | ICD-10-CM | POA: Diagnosis not present

## 2017-04-20 DIAGNOSIS — N2581 Secondary hyperparathyroidism of renal origin: Secondary | ICD-10-CM | POA: Diagnosis not present

## 2017-04-20 DIAGNOSIS — E1129 Type 2 diabetes mellitus with other diabetic kidney complication: Secondary | ICD-10-CM | POA: Diagnosis not present

## 2017-04-20 DIAGNOSIS — D509 Iron deficiency anemia, unspecified: Secondary | ICD-10-CM | POA: Diagnosis not present

## 2017-04-20 DIAGNOSIS — D631 Anemia in chronic kidney disease: Secondary | ICD-10-CM | POA: Diagnosis not present

## 2017-04-23 ENCOUNTER — Other Ambulatory Visit: Payer: Self-pay | Admitting: Infectious Disease

## 2017-04-23 DIAGNOSIS — D631 Anemia in chronic kidney disease: Secondary | ICD-10-CM | POA: Diagnosis not present

## 2017-04-23 DIAGNOSIS — N186 End stage renal disease: Secondary | ICD-10-CM | POA: Diagnosis not present

## 2017-04-23 DIAGNOSIS — E1129 Type 2 diabetes mellitus with other diabetic kidney complication: Secondary | ICD-10-CM | POA: Diagnosis not present

## 2017-04-23 DIAGNOSIS — D509 Iron deficiency anemia, unspecified: Secondary | ICD-10-CM | POA: Diagnosis not present

## 2017-04-23 DIAGNOSIS — N2581 Secondary hyperparathyroidism of renal origin: Secondary | ICD-10-CM | POA: Diagnosis not present

## 2017-04-23 DIAGNOSIS — L732 Hidradenitis suppurativa: Secondary | ICD-10-CM

## 2017-04-25 DIAGNOSIS — D631 Anemia in chronic kidney disease: Secondary | ICD-10-CM | POA: Diagnosis not present

## 2017-04-25 DIAGNOSIS — E1129 Type 2 diabetes mellitus with other diabetic kidney complication: Secondary | ICD-10-CM | POA: Diagnosis not present

## 2017-04-25 DIAGNOSIS — N186 End stage renal disease: Secondary | ICD-10-CM | POA: Diagnosis not present

## 2017-04-25 DIAGNOSIS — N2581 Secondary hyperparathyroidism of renal origin: Secondary | ICD-10-CM | POA: Diagnosis not present

## 2017-04-25 DIAGNOSIS — D509 Iron deficiency anemia, unspecified: Secondary | ICD-10-CM | POA: Diagnosis not present

## 2017-04-26 DIAGNOSIS — N186 End stage renal disease: Secondary | ICD-10-CM | POA: Diagnosis not present

## 2017-04-26 DIAGNOSIS — Z992 Dependence on renal dialysis: Secondary | ICD-10-CM | POA: Diagnosis not present

## 2017-04-26 DIAGNOSIS — E1129 Type 2 diabetes mellitus with other diabetic kidney complication: Secondary | ICD-10-CM | POA: Diagnosis not present

## 2017-04-27 DIAGNOSIS — D509 Iron deficiency anemia, unspecified: Secondary | ICD-10-CM | POA: Diagnosis not present

## 2017-04-27 DIAGNOSIS — N2581 Secondary hyperparathyroidism of renal origin: Secondary | ICD-10-CM | POA: Diagnosis not present

## 2017-04-27 DIAGNOSIS — D631 Anemia in chronic kidney disease: Secondary | ICD-10-CM | POA: Diagnosis not present

## 2017-04-27 DIAGNOSIS — N186 End stage renal disease: Secondary | ICD-10-CM | POA: Diagnosis not present

## 2017-04-27 DIAGNOSIS — E1129 Type 2 diabetes mellitus with other diabetic kidney complication: Secondary | ICD-10-CM | POA: Diagnosis not present

## 2017-04-30 DIAGNOSIS — D631 Anemia in chronic kidney disease: Secondary | ICD-10-CM | POA: Diagnosis not present

## 2017-04-30 DIAGNOSIS — D509 Iron deficiency anemia, unspecified: Secondary | ICD-10-CM | POA: Diagnosis not present

## 2017-04-30 DIAGNOSIS — N2581 Secondary hyperparathyroidism of renal origin: Secondary | ICD-10-CM | POA: Diagnosis not present

## 2017-04-30 DIAGNOSIS — E1129 Type 2 diabetes mellitus with other diabetic kidney complication: Secondary | ICD-10-CM | POA: Diagnosis not present

## 2017-04-30 DIAGNOSIS — N186 End stage renal disease: Secondary | ICD-10-CM | POA: Diagnosis not present

## 2017-05-01 ENCOUNTER — Encounter: Payer: Self-pay | Admitting: Pulmonary Disease

## 2017-05-01 ENCOUNTER — Ambulatory Visit (INDEPENDENT_AMBULATORY_CARE_PROVIDER_SITE_OTHER): Payer: Medicare Other | Admitting: Pulmonary Disease

## 2017-05-01 VITALS — BP 148/78 | HR 108

## 2017-05-01 DIAGNOSIS — J9611 Chronic respiratory failure with hypoxia: Secondary | ICD-10-CM | POA: Diagnosis not present

## 2017-05-01 DIAGNOSIS — R0602 Shortness of breath: Secondary | ICD-10-CM | POA: Diagnosis not present

## 2017-05-01 NOTE — Patient Instructions (Signed)
I'll see her back in clinic in 1 year. Please give [place] a call if there is any change in his symptoms or if he needs to be seen sooner

## 2017-05-01 NOTE — Progress Notes (Signed)
[name]    [id number]    [birth year]  Primary Care Physician:White, Caren Griffins, MD  Referring Physician: Harlan Stains, MD [place], [place] [address]  Chief complaint:  Follow up for dyspnea, cough.   HPI: [name] is a 56-year-old with past mental history of end-stage renal disease on hemodialysis, DM, HTN, morbid obesity. She was last seen by Dr. Melvyn Novas in 2015 for dyspnea which is thought to be secondary to her obesity. She has recurrent admissions from sepsis secondary to Hidradenitis. She was last admitted in October 2017 for sepsis,Hidradenitis and Streptococcus Salivarius bacteremia. She follows with Dr. Radford Pax for chronic diastolic heart failure and dilated cardiomyopathy.  Her chief complaint is chronic cough for the past 2-3 weeks. She has clear mucus production. She has chronic dyspnea on exertion which is unchanged. She denies any chest pain, palpitation, wheezing, fevers, chills. She has chronic sinusitis and postnasal drip, no symptoms of acid reflux. She has very remote smoking history and no known exposures.  Interim history: She reports dyspnea which is unchanged since baseline. She continues on supplemental oxygen. Denies any cough, sputum production, wheezing, fevers, chills, hemoptysis.  Outpatient Encounter Prescriptions as of 05/01/2017  Medication Sig  . aspirin EC 81 MG tablet Take 81 mg by mouth at bedtime.   . Azelastine-Fluticasone 137-50 MCG/ACT SUSP Place 2 sprays into the nose daily.  . Calcium Acetate 667 MG TABS Take 667 mg by mouth 3 (three) times daily with meals. And snacks  . cephALEXin (KEFLEX) 500 MG capsule TAKE 1 CAPSULE (500 MG TOTAL) BY MOUTH 2 (TWO) TIMES DAILY.  Marland Kitchen diltiazem (CARDIZEM) 30 MG tablet Take 1 tablet every 4 hours AS NEEDED for rapid heart rate lasting more than 30 mins as long as top blood pressure >100.  Marland Kitchen DULoxetine (CYMBALTA) 60 MG capsule Take 1 capsule (60 mg total) by mouth daily.  .  famotidine (PEPCID) 20 MG tablet TAKE 1 TABLET BY MOUTH AT BEDTIME (Patient taking differently: Take 20 mg by mouth at bedtime)  . feeding supplement, RESOURCE BREEZE, (RESOURCE BREEZE) LIQD Take 2 Containers by mouth 2 (two) times daily between meals.   Marland Kitchen glipiZIDE (GLUCOTROL) 5 MG tablet Take 0.5 tablets (2.5 mg total) by mouth daily.  Marland Kitchen levothyroxine (SYNTHROID, LEVOTHROID) 75 MCG tablet Take 75 mcg by mouth daily.  . metroNIDAZOLE (FLAGYL) 500 MG tablet Take 1 tablet (500 mg total) by mouth 3 (three) times daily.  . midodrine (PROAMATINE) 10 MG tablet Take 1 tablet (10 mg total) by mouth 3 (three) times daily. (Patient taking differently: Take 10 mg by mouth every Monday, Wednesday, and Friday. Dialysis days)  . multivitamin (RENA-VIT) TABS tablet Take 1 tablet by mouth daily.  Marland Kitchen oxyCODONE-acetaminophen (PERCOCET/ROXICET) 5-325 MG tablet Take 1-2 tablets by mouth every 6 (six) hours as needed. Patient has not taken since started dialysis 08/2013 due to causes hypotension (Patient taking differently: Take 1-2 tablets by mouth every 6 (six) hours as needed for moderate pain. )  . pravastatin (PRAVACHOL) 40 MG tablet Take 40 mg by mouth daily. Patient takes in am  . Probiotic Product (PROBIOTIC DAILY PO) Take 1 tablet by mouth daily.   . sevelamer carbonate (RENVELA) 800 MG tablet Take 2,400 mg by mouth 3 (three) times daily with meals.  . sucroferric oxyhydroxide (VELPHORO) 500 MG chewable tablet Chew 500 mg by mouth 3 (three) times daily with meals.  . [DISCONTINUED] clindamycin (CLEOCIN T) 1 % external solution Apply  1 application topically 2 (two) times daily.   . [DISCONTINUED] vancomycin (VANCOCIN) 1-5 GM/200ML-% SOLN Inject 200 mLs (1,000 mg total) into the vein every Monday, Wednesday, and Friday with hemodialysis. (Patient not taking: Reported on 05/01/2017)   No facility-administered encounter medications on file as of 05/01/2017.     Allergies as of 05/01/2017 - Review Complete 05/01/2017    Allergen Reaction Noted  . Rosiglitazone Other (See Comments) 09/19/2011  . Amoxicillin Rash and Other (See Comments) 08/03/2011  . Amoxicillin-pot clavulanate Diarrhea 09/19/2011    Past Medical History:  Diagnosis Date  . Chronic diastolic CHF (congestive heart failure) ([facility])   . Chronic kidney disease (CKD), stage III (moderate)    [name] nephrology lov note 05-12-2013 on pt chart now on HD  . DCM (dilated cardiomyopathy) ([facility])    nonischemic - EF now 60-65% by echo 2017  . Depression   . DM type 2 (diabetes mellitus, type 2) ([facility])    diet controlledwith retinopathy and nephropathy  . GERD (gastroesophageal reflux disease)   . H/O cardiac arrest 10/2012  . Hard of hearing 10/2012   now wears 2 hearing aids  . History of hemodialysis dec 2013  . HTN (hypertension)   . Hydradenitis   . Hyperkalemia   . Hyperlipidemia   . Hypothyroidism   . Iron deficiency anemia   . Morbid obesity ([facility])   . Multinodular goiter   . Pneumonia Nov 10, 2012  . Sleep apnea    no test yet per mother 10/22/13   . Tinnitus 12/26/2016  . Typical atrial flutter ([facility]) 04/10/2017  . Urinary tract infection    taking antibiotics for 3 days prior to surgery    Past Surgical History:  Procedure Laterality Date  . AV FISTULA PLACEMENT Left 07/25/2013   Procedure: ARTERIOVENOUS (AV) FISTULA CREATION ;  Surgeon: Rosetta Posner, MD;  Location: [facility];  Service: Vascular;  Laterality: Left;  . CYSTOSCOPY W/ RETROGRADES  11/16/2012   Procedure: CYSTOSCOPY WITH RETROGRADE PYELOGRAM;  Surgeon: Reece Packer, MD;  Location: [facility];  Service: Urology;  Laterality: Bilateral;  CYSTOSCOPY,BILATERAL RETROGRADE PYELOGRAM/ REMOVAL LEFT URETERAL STENT/ FULGERATION BLADDER MUCOSA/ INSERTION RIGHT URETERAL STENT  . CYSTOSCOPY W/ URETERAL STENT PLACEMENT  05/28/2012   Procedure: CYSTOSCOPY WITH RETROGRADE PYELOGRAM/URETERAL STENT PLACEMENT;  Surgeon: Reece Packer, MD;  Location: [facility];  Service: Urology;   Laterality: Left;  . CYSTOSCOPY WITH RETROGRADE PYELOGRAM, URETEROSCOPY AND STENT PLACEMENT Right 06/23/2013   Procedure: CYSTOSCOPY WITH RIGHT URETEROSCOPY, RIGHT  RETROGRADE PYELOGRAM, WITH LASER LIPOTRIPSY AND RIGHT URETERAL STENT EXCHANGE ;  Surgeon: Molli Hazard, MD;  Location: [facility];  Service: Urology;  Laterality: Right;  STENT EXCHANGE    . CYSTOSCOPY WITH RETROGRADE PYELOGRAM, URETEROSCOPY AND STENT PLACEMENT Right 11/06/2013   Procedure: CYSTOSCOPY WITH RETROGRADE PYELOGRAM, URETEROSCOPY STONE REMOVAL AND STENT REMOVAL;  Surgeon: Molli Hazard, MD;  Location: [facility];  Service: Urology;  Laterality: Right;  . HOLMIUM LASER APPLICATION N/A 2/45/8099   Procedure: HOLMIUM LASER APPLICATION;  Surgeon: Molli Hazard, MD;  Location: [facility];  Service: Urology;  Laterality: N/A;  . INSERTION OF DIALYSIS CATHETER N/A 09/24/2013   Procedure: INSERTION OF DIALYSIS CATHETER;  Surgeon: Rosetta Posner, MD;  Location: [facility];  Service: Vascular;  Laterality: N/A;  . PORT-A-CATH REMOVAL    . PORTACATH PLACEMENT    . REVISON OF ARTERIOVENOUS FISTULA Left 07/25/2016   Procedure: REVISON OF LEFT ARTERIOVENOUS FISTULA;  Surgeon: Elam Dutch, MD;  Location:  [facility];  Service: Vascular;  Laterality: Left;    Family History  Problem Relation Age of Onset  . Coronary artery disease Mother   . Hypertension Mother   . Diabetes type II Mother   . Coronary artery disease Father   . Hypertension Father   . Malignant hyperthermia Father   . Cancer Maternal Grandfather        ? Type  . Kidney failure Maternal Grandmother     Social History   Social History  . Marital status: Married    Spouse name: N/A  . Number of children: N/A  . Years of education: N/A   Occupational History  . Not on file.   Social History Main Topics  . Smoking status: Former Smoker    Packs/day: 0.25    Years: 1.00    Types: Cigarettes    Quit date: 11/28/1979  . Smokeless tobacco: Never Used  .  Alcohol use No  . Drug use: No  . Sexual activity: No   Other Topics Concern  . Not on file   Social History Narrative  . No narrative on file   Review of systems: Review of Systems  Constitutional: Negative for fever and chills.  HENT: Negative.   Eyes: Negative for blurred vision.  Respiratory: as per HPI  Cardiovascular: Negative for chest pain and palpitations.  Gastrointestinal: Negative for vomiting, diarrhea, blood per rectum. Genitourinary: Negative for dysuria, urgency, frequency and hematuria.  Musculoskeletal: Negative for myalgias, back pain and joint pain.  Skin: Negative for itching and rash.  Neurological: Negative for dizziness, tremors, focal weakness, seizures and loss of consciousness.  Endo/Heme/Allergies: Negative for environmental allergies.  Psychiatric/Behavioral: Negative for depression, suicidal ideas and hallucinations.  All other systems reviewed and are negative.  Physical Exam: Blood pressure (!) 148/78, pulse (!) 108, last menstrual period 06/13/2013, SpO2 90 %. Gen:      No acute distress HEENT:  EOMI, sclera anicteric Neck:     No masses; no thyromegaly Lungs:    Clear to auscultation bilaterally; normal respiratory effort CV:         Regular rate and rhythm; no murmurs Abd:      + bowel sounds; soft, non-tender; no palpable masses, no distension Ext:    No edema; adequate peripheral perfusion Skin:      Warm and dry; no rash Neuro: alert and oriented x 3 Psych: normal mood and affect  Data Reviewed: CXR 08/25/16- Mild vascular congestion. Bibasilar atelectasis.  CXR 10/03/16-mild  interstitial prominence, bronchitis CXR 02/02/17-no acute cardiopulmonary abnormality I reviewed all images personally  Echo 08/31/16-   Left ventricle: The cavity size was normal. Wall thickness was   increased in a pattern of mild LVH. Systolic function was normal.   The estimated ejection fraction was in the range of 60% to 65%.   Wall motion was normal; there  were no regional wall motion   abnormalities. Left ventricular diastolic function parameters   were normal.  PFTs 11/07/16 FVC 1.77 (57%) FEV1 1.62 (66%) F/F 91 TLC 52% RV/TLC 112% DLCO 44% Moderate restriction with diffusion impairment. Reduced diffusion capacity corrects for alveolar volume.  Assessment:  #1 Chronic respiratory failure Suspect that this is secondary to obesity and deconditioning. There is no evidence of lung disease on prior imaging and COPD is not likely in setting of minimal smoking history. PFTs show restriction and DLCO impairment. The DLCO impairment corrects for alveolar volume. I suspect these are secondary to her obesity and body habitus. I  do not see any evidence of interstitial lung disease on her recent chest x-ray.  #2 Cough Likely upper airway cough syndrome from post nasal drip, sinus congestion. Improved with chlorpheniramine 8 mg tid and dymista nasal spray.  Plan/Recommendations: - Continue monitoring symptoms  Return to clinic in 1 year  Marshell Garfinkel MD [facility] Pager [phone] 05/01/2017, 12:47 PM  CC: Harlan Stains, MD

## 2017-05-02 DIAGNOSIS — N2581 Secondary hyperparathyroidism of renal origin: Secondary | ICD-10-CM | POA: Diagnosis not present

## 2017-05-02 DIAGNOSIS — E1129 Type 2 diabetes mellitus with other diabetic kidney complication: Secondary | ICD-10-CM | POA: Diagnosis not present

## 2017-05-02 DIAGNOSIS — D631 Anemia in chronic kidney disease: Secondary | ICD-10-CM | POA: Diagnosis not present

## 2017-05-02 DIAGNOSIS — D509 Iron deficiency anemia, unspecified: Secondary | ICD-10-CM | POA: Diagnosis not present

## 2017-05-02 DIAGNOSIS — N186 End stage renal disease: Secondary | ICD-10-CM | POA: Diagnosis not present

## 2017-05-04 DIAGNOSIS — D509 Iron deficiency anemia, unspecified: Secondary | ICD-10-CM | POA: Diagnosis not present

## 2017-05-04 DIAGNOSIS — E1129 Type 2 diabetes mellitus with other diabetic kidney complication: Secondary | ICD-10-CM | POA: Diagnosis not present

## 2017-05-04 DIAGNOSIS — D631 Anemia in chronic kidney disease: Secondary | ICD-10-CM | POA: Diagnosis not present

## 2017-05-04 DIAGNOSIS — N186 End stage renal disease: Secondary | ICD-10-CM | POA: Diagnosis not present

## 2017-05-04 DIAGNOSIS — N2581 Secondary hyperparathyroidism of renal origin: Secondary | ICD-10-CM | POA: Diagnosis not present

## 2017-05-07 DIAGNOSIS — N2581 Secondary hyperparathyroidism of renal origin: Secondary | ICD-10-CM | POA: Diagnosis not present

## 2017-05-07 DIAGNOSIS — D509 Iron deficiency anemia, unspecified: Secondary | ICD-10-CM | POA: Diagnosis not present

## 2017-05-07 DIAGNOSIS — D631 Anemia in chronic kidney disease: Secondary | ICD-10-CM | POA: Diagnosis not present

## 2017-05-07 DIAGNOSIS — N186 End stage renal disease: Secondary | ICD-10-CM | POA: Diagnosis not present

## 2017-05-07 DIAGNOSIS — E1129 Type 2 diabetes mellitus with other diabetic kidney complication: Secondary | ICD-10-CM | POA: Diagnosis not present

## 2017-05-09 DIAGNOSIS — D631 Anemia in chronic kidney disease: Secondary | ICD-10-CM | POA: Diagnosis not present

## 2017-05-09 DIAGNOSIS — D509 Iron deficiency anemia, unspecified: Secondary | ICD-10-CM | POA: Diagnosis not present

## 2017-05-09 DIAGNOSIS — N2581 Secondary hyperparathyroidism of renal origin: Secondary | ICD-10-CM | POA: Diagnosis not present

## 2017-05-09 DIAGNOSIS — N186 End stage renal disease: Secondary | ICD-10-CM | POA: Diagnosis not present

## 2017-05-09 DIAGNOSIS — E1129 Type 2 diabetes mellitus with other diabetic kidney complication: Secondary | ICD-10-CM | POA: Diagnosis not present

## 2017-05-10 ENCOUNTER — Ambulatory Visit (INDEPENDENT_AMBULATORY_CARE_PROVIDER_SITE_OTHER): Payer: Medicare Other | Admitting: Podiatry

## 2017-05-10 DIAGNOSIS — M79675 Pain in left toe(s): Secondary | ICD-10-CM | POA: Diagnosis not present

## 2017-05-10 DIAGNOSIS — E1149 Type 2 diabetes mellitus with other diabetic neurological complication: Secondary | ICD-10-CM | POA: Diagnosis not present

## 2017-05-10 DIAGNOSIS — B351 Tinea unguium: Secondary | ICD-10-CM

## 2017-05-10 DIAGNOSIS — L84 Corns and callosities: Secondary | ICD-10-CM | POA: Diagnosis not present

## 2017-05-10 DIAGNOSIS — M79674 Pain in right toe(s): Secondary | ICD-10-CM

## 2017-05-11 DIAGNOSIS — N186 End stage renal disease: Secondary | ICD-10-CM | POA: Diagnosis not present

## 2017-05-11 DIAGNOSIS — D631 Anemia in chronic kidney disease: Secondary | ICD-10-CM | POA: Diagnosis not present

## 2017-05-11 DIAGNOSIS — D509 Iron deficiency anemia, unspecified: Secondary | ICD-10-CM | POA: Diagnosis not present

## 2017-05-11 DIAGNOSIS — N2581 Secondary hyperparathyroidism of renal origin: Secondary | ICD-10-CM | POA: Diagnosis not present

## 2017-05-11 DIAGNOSIS — E1129 Type 2 diabetes mellitus with other diabetic kidney complication: Secondary | ICD-10-CM | POA: Diagnosis not present

## 2017-05-13 NOTE — Progress Notes (Signed)
Subjective: 56 y.o. returns the office today for painful, elongated, thickened toenails which she cannot trim herself. Denies any redness or drainage around the nails. She has calluses to both of her heels was to cause discomfort at times. Denies any acute changes since last appointment and no new complaints today. Denies any systemic complaints such as fevers, chills, nausea, vomiting.   Objective: AAO 3, NAD DP/PT pulses palpable, CRT less than 3 seconds Protective sensation decreased with Simms Weinstein monofilament Nails hypertrophic, dystrophic, elongated, brittle, discolored 10. There is tenderness overlying the nails 1-5 bilaterally. There is no surrounding erythema or drainage along the nail sites. Hyperkeratotic lesions to bilateral plantar heels. Upon debridement there is no underlying ulceration, drainage or any signs of infection. No open lesions or pre-ulcerative lesions are identified. No other areas of tenderness bilateral lower extremities. No overlying edema, erythema, increased warmth. No pain with calf compression, swelling, warmth, erythema.  Assessment: Patient presents with symptomatic onychomycosis, calluses 2  Plan: -Treatment options including alternatives, risks, complications were discussed -Nails sharply debrided 10 without complication/bleeding. -Hyperkeratotic lesion sharply debrided 2 without complications or bleeding. -Discussed daily foot inspection. If there are any changes, to call the office immediately.  -Follow-up in 3 months or sooner if any problems are to arise. In the meantime, encouraged to call the office with any questions, concerns, changes symptoms.  Celesta Gentile, DPM

## 2017-05-14 DIAGNOSIS — D509 Iron deficiency anemia, unspecified: Secondary | ICD-10-CM | POA: Diagnosis not present

## 2017-05-14 DIAGNOSIS — N2581 Secondary hyperparathyroidism of renal origin: Secondary | ICD-10-CM | POA: Diagnosis not present

## 2017-05-14 DIAGNOSIS — N186 End stage renal disease: Secondary | ICD-10-CM | POA: Diagnosis not present

## 2017-05-14 DIAGNOSIS — D631 Anemia in chronic kidney disease: Secondary | ICD-10-CM | POA: Diagnosis not present

## 2017-05-14 DIAGNOSIS — E1129 Type 2 diabetes mellitus with other diabetic kidney complication: Secondary | ICD-10-CM | POA: Diagnosis not present

## 2017-05-16 DIAGNOSIS — N186 End stage renal disease: Secondary | ICD-10-CM | POA: Diagnosis not present

## 2017-05-16 DIAGNOSIS — N2581 Secondary hyperparathyroidism of renal origin: Secondary | ICD-10-CM | POA: Diagnosis not present

## 2017-05-16 DIAGNOSIS — E1129 Type 2 diabetes mellitus with other diabetic kidney complication: Secondary | ICD-10-CM | POA: Diagnosis not present

## 2017-05-16 DIAGNOSIS — D631 Anemia in chronic kidney disease: Secondary | ICD-10-CM | POA: Diagnosis not present

## 2017-05-16 DIAGNOSIS — D509 Iron deficiency anemia, unspecified: Secondary | ICD-10-CM | POA: Diagnosis not present

## 2017-05-18 DIAGNOSIS — D509 Iron deficiency anemia, unspecified: Secondary | ICD-10-CM | POA: Diagnosis not present

## 2017-05-18 DIAGNOSIS — N2581 Secondary hyperparathyroidism of renal origin: Secondary | ICD-10-CM | POA: Diagnosis not present

## 2017-05-18 DIAGNOSIS — N186 End stage renal disease: Secondary | ICD-10-CM | POA: Diagnosis not present

## 2017-05-18 DIAGNOSIS — D631 Anemia in chronic kidney disease: Secondary | ICD-10-CM | POA: Diagnosis not present

## 2017-05-18 DIAGNOSIS — E1129 Type 2 diabetes mellitus with other diabetic kidney complication: Secondary | ICD-10-CM | POA: Diagnosis not present

## 2017-05-21 DIAGNOSIS — N186 End stage renal disease: Secondary | ICD-10-CM | POA: Diagnosis not present

## 2017-05-21 DIAGNOSIS — N2581 Secondary hyperparathyroidism of renal origin: Secondary | ICD-10-CM | POA: Diagnosis not present

## 2017-05-21 DIAGNOSIS — D509 Iron deficiency anemia, unspecified: Secondary | ICD-10-CM | POA: Diagnosis not present

## 2017-05-21 DIAGNOSIS — E1129 Type 2 diabetes mellitus with other diabetic kidney complication: Secondary | ICD-10-CM | POA: Diagnosis not present

## 2017-05-21 DIAGNOSIS — D631 Anemia in chronic kidney disease: Secondary | ICD-10-CM | POA: Diagnosis not present

## 2017-05-23 DIAGNOSIS — D631 Anemia in chronic kidney disease: Secondary | ICD-10-CM | POA: Diagnosis not present

## 2017-05-23 DIAGNOSIS — N186 End stage renal disease: Secondary | ICD-10-CM | POA: Diagnosis not present

## 2017-05-23 DIAGNOSIS — D509 Iron deficiency anemia, unspecified: Secondary | ICD-10-CM | POA: Diagnosis not present

## 2017-05-23 DIAGNOSIS — E1129 Type 2 diabetes mellitus with other diabetic kidney complication: Secondary | ICD-10-CM | POA: Diagnosis not present

## 2017-05-23 DIAGNOSIS — N2581 Secondary hyperparathyroidism of renal origin: Secondary | ICD-10-CM | POA: Diagnosis not present

## 2017-05-25 DIAGNOSIS — N186 End stage renal disease: Secondary | ICD-10-CM | POA: Diagnosis not present

## 2017-05-25 DIAGNOSIS — E1129 Type 2 diabetes mellitus with other diabetic kidney complication: Secondary | ICD-10-CM | POA: Diagnosis not present

## 2017-05-25 DIAGNOSIS — D631 Anemia in chronic kidney disease: Secondary | ICD-10-CM | POA: Diagnosis not present

## 2017-05-25 DIAGNOSIS — N2581 Secondary hyperparathyroidism of renal origin: Secondary | ICD-10-CM | POA: Diagnosis not present

## 2017-05-25 DIAGNOSIS — D509 Iron deficiency anemia, unspecified: Secondary | ICD-10-CM | POA: Diagnosis not present

## 2017-05-26 DIAGNOSIS — Z992 Dependence on renal dialysis: Secondary | ICD-10-CM | POA: Diagnosis not present

## 2017-05-26 DIAGNOSIS — E1129 Type 2 diabetes mellitus with other diabetic kidney complication: Secondary | ICD-10-CM | POA: Diagnosis not present

## 2017-05-26 DIAGNOSIS — N186 End stage renal disease: Secondary | ICD-10-CM | POA: Diagnosis not present

## 2017-05-28 DIAGNOSIS — N186 End stage renal disease: Secondary | ICD-10-CM | POA: Diagnosis not present

## 2017-05-28 DIAGNOSIS — E1129 Type 2 diabetes mellitus with other diabetic kidney complication: Secondary | ICD-10-CM | POA: Diagnosis not present

## 2017-05-28 DIAGNOSIS — D631 Anemia in chronic kidney disease: Secondary | ICD-10-CM | POA: Diagnosis not present

## 2017-05-28 DIAGNOSIS — N2581 Secondary hyperparathyroidism of renal origin: Secondary | ICD-10-CM | POA: Diagnosis not present

## 2017-05-28 DIAGNOSIS — D509 Iron deficiency anemia, unspecified: Secondary | ICD-10-CM | POA: Diagnosis not present

## 2017-05-30 DIAGNOSIS — E1129 Type 2 diabetes mellitus with other diabetic kidney complication: Secondary | ICD-10-CM | POA: Diagnosis not present

## 2017-05-30 DIAGNOSIS — N186 End stage renal disease: Secondary | ICD-10-CM | POA: Diagnosis not present

## 2017-05-30 DIAGNOSIS — D509 Iron deficiency anemia, unspecified: Secondary | ICD-10-CM | POA: Diagnosis not present

## 2017-05-30 DIAGNOSIS — D631 Anemia in chronic kidney disease: Secondary | ICD-10-CM | POA: Diagnosis not present

## 2017-05-30 DIAGNOSIS — N2581 Secondary hyperparathyroidism of renal origin: Secondary | ICD-10-CM | POA: Diagnosis not present

## 2017-06-01 DIAGNOSIS — D509 Iron deficiency anemia, unspecified: Secondary | ICD-10-CM | POA: Diagnosis not present

## 2017-06-01 DIAGNOSIS — E1129 Type 2 diabetes mellitus with other diabetic kidney complication: Secondary | ICD-10-CM | POA: Diagnosis not present

## 2017-06-01 DIAGNOSIS — N186 End stage renal disease: Secondary | ICD-10-CM | POA: Diagnosis not present

## 2017-06-01 DIAGNOSIS — N2581 Secondary hyperparathyroidism of renal origin: Secondary | ICD-10-CM | POA: Diagnosis not present

## 2017-06-01 DIAGNOSIS — D631 Anemia in chronic kidney disease: Secondary | ICD-10-CM | POA: Diagnosis not present

## 2017-06-04 ENCOUNTER — Other Ambulatory Visit: Payer: Self-pay | Admitting: Infectious Disease

## 2017-06-04 DIAGNOSIS — D631 Anemia in chronic kidney disease: Secondary | ICD-10-CM | POA: Diagnosis not present

## 2017-06-04 DIAGNOSIS — E1129 Type 2 diabetes mellitus with other diabetic kidney complication: Secondary | ICD-10-CM | POA: Diagnosis not present

## 2017-06-04 DIAGNOSIS — N2581 Secondary hyperparathyroidism of renal origin: Secondary | ICD-10-CM | POA: Diagnosis not present

## 2017-06-04 DIAGNOSIS — L732 Hidradenitis suppurativa: Secondary | ICD-10-CM

## 2017-06-04 DIAGNOSIS — N186 End stage renal disease: Secondary | ICD-10-CM | POA: Diagnosis not present

## 2017-06-04 DIAGNOSIS — D509 Iron deficiency anemia, unspecified: Secondary | ICD-10-CM | POA: Diagnosis not present

## 2017-06-06 DIAGNOSIS — D631 Anemia in chronic kidney disease: Secondary | ICD-10-CM | POA: Diagnosis not present

## 2017-06-06 DIAGNOSIS — N2581 Secondary hyperparathyroidism of renal origin: Secondary | ICD-10-CM | POA: Diagnosis not present

## 2017-06-06 DIAGNOSIS — E1129 Type 2 diabetes mellitus with other diabetic kidney complication: Secondary | ICD-10-CM | POA: Diagnosis not present

## 2017-06-06 DIAGNOSIS — N186 End stage renal disease: Secondary | ICD-10-CM | POA: Diagnosis not present

## 2017-06-06 DIAGNOSIS — D509 Iron deficiency anemia, unspecified: Secondary | ICD-10-CM | POA: Diagnosis not present

## 2017-06-08 DIAGNOSIS — E1129 Type 2 diabetes mellitus with other diabetic kidney complication: Secondary | ICD-10-CM | POA: Diagnosis not present

## 2017-06-08 DIAGNOSIS — D509 Iron deficiency anemia, unspecified: Secondary | ICD-10-CM | POA: Diagnosis not present

## 2017-06-08 DIAGNOSIS — D631 Anemia in chronic kidney disease: Secondary | ICD-10-CM | POA: Diagnosis not present

## 2017-06-08 DIAGNOSIS — N186 End stage renal disease: Secondary | ICD-10-CM | POA: Diagnosis not present

## 2017-06-08 DIAGNOSIS — N2581 Secondary hyperparathyroidism of renal origin: Secondary | ICD-10-CM | POA: Diagnosis not present

## 2017-06-11 DIAGNOSIS — N2581 Secondary hyperparathyroidism of renal origin: Secondary | ICD-10-CM | POA: Diagnosis not present

## 2017-06-11 DIAGNOSIS — N186 End stage renal disease: Secondary | ICD-10-CM | POA: Diagnosis not present

## 2017-06-11 DIAGNOSIS — D631 Anemia in chronic kidney disease: Secondary | ICD-10-CM | POA: Diagnosis not present

## 2017-06-11 DIAGNOSIS — D509 Iron deficiency anemia, unspecified: Secondary | ICD-10-CM | POA: Diagnosis not present

## 2017-06-11 DIAGNOSIS — E1129 Type 2 diabetes mellitus with other diabetic kidney complication: Secondary | ICD-10-CM | POA: Diagnosis not present

## 2017-06-13 DIAGNOSIS — D631 Anemia in chronic kidney disease: Secondary | ICD-10-CM | POA: Diagnosis not present

## 2017-06-13 DIAGNOSIS — N2581 Secondary hyperparathyroidism of renal origin: Secondary | ICD-10-CM | POA: Diagnosis not present

## 2017-06-13 DIAGNOSIS — D509 Iron deficiency anemia, unspecified: Secondary | ICD-10-CM | POA: Diagnosis not present

## 2017-06-13 DIAGNOSIS — N186 End stage renal disease: Secondary | ICD-10-CM | POA: Diagnosis not present

## 2017-06-13 DIAGNOSIS — E1129 Type 2 diabetes mellitus with other diabetic kidney complication: Secondary | ICD-10-CM | POA: Diagnosis not present

## 2017-06-15 DIAGNOSIS — N186 End stage renal disease: Secondary | ICD-10-CM | POA: Diagnosis not present

## 2017-06-15 DIAGNOSIS — E1129 Type 2 diabetes mellitus with other diabetic kidney complication: Secondary | ICD-10-CM | POA: Diagnosis not present

## 2017-06-15 DIAGNOSIS — N2581 Secondary hyperparathyroidism of renal origin: Secondary | ICD-10-CM | POA: Diagnosis not present

## 2017-06-15 DIAGNOSIS — D509 Iron deficiency anemia, unspecified: Secondary | ICD-10-CM | POA: Diagnosis not present

## 2017-06-15 DIAGNOSIS — D631 Anemia in chronic kidney disease: Secondary | ICD-10-CM | POA: Diagnosis not present

## 2017-06-18 DIAGNOSIS — D631 Anemia in chronic kidney disease: Secondary | ICD-10-CM | POA: Diagnosis not present

## 2017-06-18 DIAGNOSIS — D509 Iron deficiency anemia, unspecified: Secondary | ICD-10-CM | POA: Diagnosis not present

## 2017-06-18 DIAGNOSIS — N2581 Secondary hyperparathyroidism of renal origin: Secondary | ICD-10-CM | POA: Diagnosis not present

## 2017-06-18 DIAGNOSIS — E1129 Type 2 diabetes mellitus with other diabetic kidney complication: Secondary | ICD-10-CM | POA: Diagnosis not present

## 2017-06-18 DIAGNOSIS — N186 End stage renal disease: Secondary | ICD-10-CM | POA: Diagnosis not present

## 2017-06-20 DIAGNOSIS — E1129 Type 2 diabetes mellitus with other diabetic kidney complication: Secondary | ICD-10-CM | POA: Diagnosis not present

## 2017-06-20 DIAGNOSIS — D509 Iron deficiency anemia, unspecified: Secondary | ICD-10-CM | POA: Diagnosis not present

## 2017-06-20 DIAGNOSIS — N186 End stage renal disease: Secondary | ICD-10-CM | POA: Diagnosis not present

## 2017-06-20 DIAGNOSIS — N2581 Secondary hyperparathyroidism of renal origin: Secondary | ICD-10-CM | POA: Diagnosis not present

## 2017-06-20 DIAGNOSIS — D631 Anemia in chronic kidney disease: Secondary | ICD-10-CM | POA: Diagnosis not present

## 2017-06-22 DIAGNOSIS — D509 Iron deficiency anemia, unspecified: Secondary | ICD-10-CM | POA: Diagnosis not present

## 2017-06-22 DIAGNOSIS — N186 End stage renal disease: Secondary | ICD-10-CM | POA: Diagnosis not present

## 2017-06-22 DIAGNOSIS — E1129 Type 2 diabetes mellitus with other diabetic kidney complication: Secondary | ICD-10-CM | POA: Diagnosis not present

## 2017-06-22 DIAGNOSIS — N2581 Secondary hyperparathyroidism of renal origin: Secondary | ICD-10-CM | POA: Diagnosis not present

## 2017-06-22 DIAGNOSIS — D631 Anemia in chronic kidney disease: Secondary | ICD-10-CM | POA: Diagnosis not present

## 2017-06-25 DIAGNOSIS — D631 Anemia in chronic kidney disease: Secondary | ICD-10-CM | POA: Diagnosis not present

## 2017-06-25 DIAGNOSIS — N2581 Secondary hyperparathyroidism of renal origin: Secondary | ICD-10-CM | POA: Diagnosis not present

## 2017-06-25 DIAGNOSIS — E1129 Type 2 diabetes mellitus with other diabetic kidney complication: Secondary | ICD-10-CM | POA: Diagnosis not present

## 2017-06-25 DIAGNOSIS — N186 End stage renal disease: Secondary | ICD-10-CM | POA: Diagnosis not present

## 2017-06-25 DIAGNOSIS — D509 Iron deficiency anemia, unspecified: Secondary | ICD-10-CM | POA: Diagnosis not present

## 2017-06-26 DIAGNOSIS — E1129 Type 2 diabetes mellitus with other diabetic kidney complication: Secondary | ICD-10-CM | POA: Diagnosis not present

## 2017-06-26 DIAGNOSIS — N186 End stage renal disease: Secondary | ICD-10-CM | POA: Diagnosis not present

## 2017-06-26 DIAGNOSIS — Z992 Dependence on renal dialysis: Secondary | ICD-10-CM | POA: Diagnosis not present

## 2017-06-27 DIAGNOSIS — D631 Anemia in chronic kidney disease: Secondary | ICD-10-CM | POA: Diagnosis not present

## 2017-06-27 DIAGNOSIS — E1129 Type 2 diabetes mellitus with other diabetic kidney complication: Secondary | ICD-10-CM | POA: Diagnosis not present

## 2017-06-27 DIAGNOSIS — D509 Iron deficiency anemia, unspecified: Secondary | ICD-10-CM | POA: Diagnosis not present

## 2017-06-27 DIAGNOSIS — N186 End stage renal disease: Secondary | ICD-10-CM | POA: Diagnosis not present

## 2017-06-27 DIAGNOSIS — N2581 Secondary hyperparathyroidism of renal origin: Secondary | ICD-10-CM | POA: Diagnosis not present

## 2017-06-29 DIAGNOSIS — N2581 Secondary hyperparathyroidism of renal origin: Secondary | ICD-10-CM | POA: Diagnosis not present

## 2017-06-29 DIAGNOSIS — D509 Iron deficiency anemia, unspecified: Secondary | ICD-10-CM | POA: Diagnosis not present

## 2017-06-29 DIAGNOSIS — D631 Anemia in chronic kidney disease: Secondary | ICD-10-CM | POA: Diagnosis not present

## 2017-06-29 DIAGNOSIS — N186 End stage renal disease: Secondary | ICD-10-CM | POA: Diagnosis not present

## 2017-06-29 DIAGNOSIS — E1129 Type 2 diabetes mellitus with other diabetic kidney complication: Secondary | ICD-10-CM | POA: Diagnosis not present

## 2017-07-02 DIAGNOSIS — D509 Iron deficiency anemia, unspecified: Secondary | ICD-10-CM | POA: Diagnosis not present

## 2017-07-02 DIAGNOSIS — N186 End stage renal disease: Secondary | ICD-10-CM | POA: Diagnosis not present

## 2017-07-02 DIAGNOSIS — N2581 Secondary hyperparathyroidism of renal origin: Secondary | ICD-10-CM | POA: Diagnosis not present

## 2017-07-02 DIAGNOSIS — D631 Anemia in chronic kidney disease: Secondary | ICD-10-CM | POA: Diagnosis not present

## 2017-07-02 DIAGNOSIS — E1129 Type 2 diabetes mellitus with other diabetic kidney complication: Secondary | ICD-10-CM | POA: Diagnosis not present

## 2017-07-04 DIAGNOSIS — E1129 Type 2 diabetes mellitus with other diabetic kidney complication: Secondary | ICD-10-CM | POA: Diagnosis not present

## 2017-07-04 DIAGNOSIS — D509 Iron deficiency anemia, unspecified: Secondary | ICD-10-CM | POA: Diagnosis not present

## 2017-07-04 DIAGNOSIS — N186 End stage renal disease: Secondary | ICD-10-CM | POA: Diagnosis not present

## 2017-07-04 DIAGNOSIS — N2581 Secondary hyperparathyroidism of renal origin: Secondary | ICD-10-CM | POA: Diagnosis not present

## 2017-07-04 DIAGNOSIS — D631 Anemia in chronic kidney disease: Secondary | ICD-10-CM | POA: Diagnosis not present

## 2017-07-06 DIAGNOSIS — D631 Anemia in chronic kidney disease: Secondary | ICD-10-CM | POA: Diagnosis not present

## 2017-07-06 DIAGNOSIS — N2581 Secondary hyperparathyroidism of renal origin: Secondary | ICD-10-CM | POA: Diagnosis not present

## 2017-07-06 DIAGNOSIS — D509 Iron deficiency anemia, unspecified: Secondary | ICD-10-CM | POA: Diagnosis not present

## 2017-07-06 DIAGNOSIS — N186 End stage renal disease: Secondary | ICD-10-CM | POA: Diagnosis not present

## 2017-07-06 DIAGNOSIS — E1129 Type 2 diabetes mellitus with other diabetic kidney complication: Secondary | ICD-10-CM | POA: Diagnosis not present

## 2017-07-09 DIAGNOSIS — D509 Iron deficiency anemia, unspecified: Secondary | ICD-10-CM | POA: Diagnosis not present

## 2017-07-09 DIAGNOSIS — E1129 Type 2 diabetes mellitus with other diabetic kidney complication: Secondary | ICD-10-CM | POA: Diagnosis not present

## 2017-07-09 DIAGNOSIS — N2581 Secondary hyperparathyroidism of renal origin: Secondary | ICD-10-CM | POA: Diagnosis not present

## 2017-07-09 DIAGNOSIS — N186 End stage renal disease: Secondary | ICD-10-CM | POA: Diagnosis not present

## 2017-07-09 DIAGNOSIS — D631 Anemia in chronic kidney disease: Secondary | ICD-10-CM | POA: Diagnosis not present

## 2017-07-11 DIAGNOSIS — N2581 Secondary hyperparathyroidism of renal origin: Secondary | ICD-10-CM | POA: Diagnosis not present

## 2017-07-11 DIAGNOSIS — D631 Anemia in chronic kidney disease: Secondary | ICD-10-CM | POA: Diagnosis not present

## 2017-07-11 DIAGNOSIS — N186 End stage renal disease: Secondary | ICD-10-CM | POA: Diagnosis not present

## 2017-07-11 DIAGNOSIS — D509 Iron deficiency anemia, unspecified: Secondary | ICD-10-CM | POA: Diagnosis not present

## 2017-07-11 DIAGNOSIS — E1129 Type 2 diabetes mellitus with other diabetic kidney complication: Secondary | ICD-10-CM | POA: Diagnosis not present

## 2017-07-13 DIAGNOSIS — D509 Iron deficiency anemia, unspecified: Secondary | ICD-10-CM | POA: Diagnosis not present

## 2017-07-13 DIAGNOSIS — N186 End stage renal disease: Secondary | ICD-10-CM | POA: Diagnosis not present

## 2017-07-13 DIAGNOSIS — D631 Anemia in chronic kidney disease: Secondary | ICD-10-CM | POA: Diagnosis not present

## 2017-07-13 DIAGNOSIS — E1129 Type 2 diabetes mellitus with other diabetic kidney complication: Secondary | ICD-10-CM | POA: Diagnosis not present

## 2017-07-13 DIAGNOSIS — N2581 Secondary hyperparathyroidism of renal origin: Secondary | ICD-10-CM | POA: Diagnosis not present

## 2017-07-16 DIAGNOSIS — N2581 Secondary hyperparathyroidism of renal origin: Secondary | ICD-10-CM | POA: Diagnosis not present

## 2017-07-16 DIAGNOSIS — E1129 Type 2 diabetes mellitus with other diabetic kidney complication: Secondary | ICD-10-CM | POA: Diagnosis not present

## 2017-07-16 DIAGNOSIS — D509 Iron deficiency anemia, unspecified: Secondary | ICD-10-CM | POA: Diagnosis not present

## 2017-07-16 DIAGNOSIS — N186 End stage renal disease: Secondary | ICD-10-CM | POA: Diagnosis not present

## 2017-07-16 DIAGNOSIS — D631 Anemia in chronic kidney disease: Secondary | ICD-10-CM | POA: Diagnosis not present

## 2017-07-17 ENCOUNTER — Ambulatory Visit: Payer: Medicare Other | Admitting: Infectious Disease

## 2017-07-18 ENCOUNTER — Other Ambulatory Visit: Payer: Self-pay | Admitting: Infectious Disease

## 2017-07-18 DIAGNOSIS — L732 Hidradenitis suppurativa: Secondary | ICD-10-CM

## 2017-07-18 DIAGNOSIS — E1129 Type 2 diabetes mellitus with other diabetic kidney complication: Secondary | ICD-10-CM | POA: Diagnosis not present

## 2017-07-18 DIAGNOSIS — D631 Anemia in chronic kidney disease: Secondary | ICD-10-CM | POA: Diagnosis not present

## 2017-07-18 DIAGNOSIS — D509 Iron deficiency anemia, unspecified: Secondary | ICD-10-CM | POA: Diagnosis not present

## 2017-07-18 DIAGNOSIS — N186 End stage renal disease: Secondary | ICD-10-CM | POA: Diagnosis not present

## 2017-07-18 DIAGNOSIS — N2581 Secondary hyperparathyroidism of renal origin: Secondary | ICD-10-CM | POA: Diagnosis not present

## 2017-07-19 ENCOUNTER — Telehealth: Payer: Self-pay | Admitting: *Deleted

## 2017-07-19 NOTE — Telephone Encounter (Signed)
Patient requesting refills of Metronidazole 500mg  three times daily and Cephalexin 500mg  twice daily. Written in April for 30 day supply with directions to follow up in July. Patient scheduled for follow up in September. Please advise. Landis Gandy, RN

## 2017-07-19 NOTE — Telephone Encounter (Signed)
Ok to fill these x 2 months

## 2017-07-20 ENCOUNTER — Other Ambulatory Visit: Payer: Self-pay | Admitting: Infectious Disease

## 2017-07-20 ENCOUNTER — Other Ambulatory Visit: Payer: Self-pay | Admitting: *Deleted

## 2017-07-20 DIAGNOSIS — D631 Anemia in chronic kidney disease: Secondary | ICD-10-CM | POA: Diagnosis not present

## 2017-07-20 DIAGNOSIS — L732 Hidradenitis suppurativa: Secondary | ICD-10-CM

## 2017-07-20 DIAGNOSIS — N186 End stage renal disease: Secondary | ICD-10-CM | POA: Diagnosis not present

## 2017-07-20 DIAGNOSIS — N2581 Secondary hyperparathyroidism of renal origin: Secondary | ICD-10-CM | POA: Diagnosis not present

## 2017-07-20 DIAGNOSIS — E1129 Type 2 diabetes mellitus with other diabetic kidney complication: Secondary | ICD-10-CM | POA: Diagnosis not present

## 2017-07-20 DIAGNOSIS — D509 Iron deficiency anemia, unspecified: Secondary | ICD-10-CM | POA: Diagnosis not present

## 2017-07-20 MED ORDER — CEPHALEXIN 500 MG PO CAPS: 500.0000 mg | ORAL_CAPSULE | Freq: Two times a day (BID) | ORAL | 2 refills | Status: DC

## 2017-07-20 NOTE — Telephone Encounter (Signed)
Refills sent. Thanks! 

## 2017-07-22 NOTE — Telephone Encounter (Signed)
Perfect

## 2017-07-23 DIAGNOSIS — D631 Anemia in chronic kidney disease: Secondary | ICD-10-CM | POA: Diagnosis not present

## 2017-07-23 DIAGNOSIS — N2581 Secondary hyperparathyroidism of renal origin: Secondary | ICD-10-CM | POA: Diagnosis not present

## 2017-07-23 DIAGNOSIS — N186 End stage renal disease: Secondary | ICD-10-CM | POA: Diagnosis not present

## 2017-07-23 DIAGNOSIS — E1129 Type 2 diabetes mellitus with other diabetic kidney complication: Secondary | ICD-10-CM | POA: Diagnosis not present

## 2017-07-23 DIAGNOSIS — D509 Iron deficiency anemia, unspecified: Secondary | ICD-10-CM | POA: Diagnosis not present

## 2017-07-25 DIAGNOSIS — E1129 Type 2 diabetes mellitus with other diabetic kidney complication: Secondary | ICD-10-CM | POA: Diagnosis not present

## 2017-07-25 DIAGNOSIS — N2581 Secondary hyperparathyroidism of renal origin: Secondary | ICD-10-CM | POA: Diagnosis not present

## 2017-07-25 DIAGNOSIS — N186 End stage renal disease: Secondary | ICD-10-CM | POA: Diagnosis not present

## 2017-07-25 DIAGNOSIS — D631 Anemia in chronic kidney disease: Secondary | ICD-10-CM | POA: Diagnosis not present

## 2017-07-25 DIAGNOSIS — D509 Iron deficiency anemia, unspecified: Secondary | ICD-10-CM | POA: Diagnosis not present

## 2017-07-27 DIAGNOSIS — E1129 Type 2 diabetes mellitus with other diabetic kidney complication: Secondary | ICD-10-CM | POA: Diagnosis not present

## 2017-07-27 DIAGNOSIS — N186 End stage renal disease: Secondary | ICD-10-CM | POA: Diagnosis not present

## 2017-07-27 DIAGNOSIS — N2581 Secondary hyperparathyroidism of renal origin: Secondary | ICD-10-CM | POA: Diagnosis not present

## 2017-07-27 DIAGNOSIS — D631 Anemia in chronic kidney disease: Secondary | ICD-10-CM | POA: Diagnosis not present

## 2017-07-27 DIAGNOSIS — I871 Compression of vein: Secondary | ICD-10-CM | POA: Diagnosis not present

## 2017-07-27 DIAGNOSIS — D509 Iron deficiency anemia, unspecified: Secondary | ICD-10-CM | POA: Diagnosis not present

## 2017-07-27 DIAGNOSIS — Z992 Dependence on renal dialysis: Secondary | ICD-10-CM | POA: Diagnosis not present

## 2017-07-30 DIAGNOSIS — N2581 Secondary hyperparathyroidism of renal origin: Secondary | ICD-10-CM | POA: Diagnosis not present

## 2017-07-30 DIAGNOSIS — D631 Anemia in chronic kidney disease: Secondary | ICD-10-CM | POA: Diagnosis not present

## 2017-07-30 DIAGNOSIS — E1129 Type 2 diabetes mellitus with other diabetic kidney complication: Secondary | ICD-10-CM | POA: Diagnosis not present

## 2017-07-30 DIAGNOSIS — N186 End stage renal disease: Secondary | ICD-10-CM | POA: Diagnosis not present

## 2017-07-30 DIAGNOSIS — D509 Iron deficiency anemia, unspecified: Secondary | ICD-10-CM | POA: Diagnosis not present

## 2017-07-30 DIAGNOSIS — L732 Hidradenitis suppurativa: Secondary | ICD-10-CM | POA: Diagnosis not present

## 2017-07-30 DIAGNOSIS — Z23 Encounter for immunization: Secondary | ICD-10-CM | POA: Diagnosis not present

## 2017-07-31 DIAGNOSIS — N186 End stage renal disease: Secondary | ICD-10-CM | POA: Diagnosis not present

## 2017-07-31 DIAGNOSIS — I871 Compression of vein: Secondary | ICD-10-CM | POA: Diagnosis not present

## 2017-07-31 DIAGNOSIS — Z992 Dependence on renal dialysis: Secondary | ICD-10-CM | POA: Diagnosis not present

## 2017-08-01 DIAGNOSIS — E1129 Type 2 diabetes mellitus with other diabetic kidney complication: Secondary | ICD-10-CM | POA: Diagnosis not present

## 2017-08-01 DIAGNOSIS — D509 Iron deficiency anemia, unspecified: Secondary | ICD-10-CM | POA: Diagnosis not present

## 2017-08-01 DIAGNOSIS — D631 Anemia in chronic kidney disease: Secondary | ICD-10-CM | POA: Diagnosis not present

## 2017-08-01 DIAGNOSIS — N2581 Secondary hyperparathyroidism of renal origin: Secondary | ICD-10-CM | POA: Diagnosis not present

## 2017-08-01 DIAGNOSIS — L732 Hidradenitis suppurativa: Secondary | ICD-10-CM | POA: Diagnosis not present

## 2017-08-01 DIAGNOSIS — N186 End stage renal disease: Secondary | ICD-10-CM | POA: Diagnosis not present

## 2017-08-03 DIAGNOSIS — E1129 Type 2 diabetes mellitus with other diabetic kidney complication: Secondary | ICD-10-CM | POA: Diagnosis not present

## 2017-08-03 DIAGNOSIS — N2581 Secondary hyperparathyroidism of renal origin: Secondary | ICD-10-CM | POA: Diagnosis not present

## 2017-08-03 DIAGNOSIS — D631 Anemia in chronic kidney disease: Secondary | ICD-10-CM | POA: Diagnosis not present

## 2017-08-03 DIAGNOSIS — L732 Hidradenitis suppurativa: Secondary | ICD-10-CM | POA: Diagnosis not present

## 2017-08-03 DIAGNOSIS — N186 End stage renal disease: Secondary | ICD-10-CM | POA: Diagnosis not present

## 2017-08-03 DIAGNOSIS — D509 Iron deficiency anemia, unspecified: Secondary | ICD-10-CM | POA: Diagnosis not present

## 2017-08-06 DIAGNOSIS — D509 Iron deficiency anemia, unspecified: Secondary | ICD-10-CM | POA: Diagnosis not present

## 2017-08-06 DIAGNOSIS — D631 Anemia in chronic kidney disease: Secondary | ICD-10-CM | POA: Diagnosis not present

## 2017-08-06 DIAGNOSIS — N2581 Secondary hyperparathyroidism of renal origin: Secondary | ICD-10-CM | POA: Diagnosis not present

## 2017-08-06 DIAGNOSIS — N186 End stage renal disease: Secondary | ICD-10-CM | POA: Diagnosis not present

## 2017-08-06 DIAGNOSIS — E1129 Type 2 diabetes mellitus with other diabetic kidney complication: Secondary | ICD-10-CM | POA: Diagnosis not present

## 2017-08-06 DIAGNOSIS — L732 Hidradenitis suppurativa: Secondary | ICD-10-CM | POA: Diagnosis not present

## 2017-08-08 DIAGNOSIS — E1129 Type 2 diabetes mellitus with other diabetic kidney complication: Secondary | ICD-10-CM | POA: Diagnosis not present

## 2017-08-08 DIAGNOSIS — N2581 Secondary hyperparathyroidism of renal origin: Secondary | ICD-10-CM | POA: Diagnosis not present

## 2017-08-08 DIAGNOSIS — D509 Iron deficiency anemia, unspecified: Secondary | ICD-10-CM | POA: Diagnosis not present

## 2017-08-08 DIAGNOSIS — D631 Anemia in chronic kidney disease: Secondary | ICD-10-CM | POA: Diagnosis not present

## 2017-08-08 DIAGNOSIS — N186 End stage renal disease: Secondary | ICD-10-CM | POA: Diagnosis not present

## 2017-08-08 DIAGNOSIS — L732 Hidradenitis suppurativa: Secondary | ICD-10-CM | POA: Diagnosis not present

## 2017-08-10 DIAGNOSIS — D631 Anemia in chronic kidney disease: Secondary | ICD-10-CM | POA: Diagnosis not present

## 2017-08-10 DIAGNOSIS — D509 Iron deficiency anemia, unspecified: Secondary | ICD-10-CM | POA: Diagnosis not present

## 2017-08-10 DIAGNOSIS — N2581 Secondary hyperparathyroidism of renal origin: Secondary | ICD-10-CM | POA: Diagnosis not present

## 2017-08-10 DIAGNOSIS — L732 Hidradenitis suppurativa: Secondary | ICD-10-CM | POA: Diagnosis not present

## 2017-08-10 DIAGNOSIS — E1129 Type 2 diabetes mellitus with other diabetic kidney complication: Secondary | ICD-10-CM | POA: Diagnosis not present

## 2017-08-10 DIAGNOSIS — N186 End stage renal disease: Secondary | ICD-10-CM | POA: Diagnosis not present

## 2017-08-13 DIAGNOSIS — E1129 Type 2 diabetes mellitus with other diabetic kidney complication: Secondary | ICD-10-CM | POA: Diagnosis not present

## 2017-08-13 DIAGNOSIS — N186 End stage renal disease: Secondary | ICD-10-CM | POA: Diagnosis not present

## 2017-08-13 DIAGNOSIS — D509 Iron deficiency anemia, unspecified: Secondary | ICD-10-CM | POA: Diagnosis not present

## 2017-08-13 DIAGNOSIS — L732 Hidradenitis suppurativa: Secondary | ICD-10-CM | POA: Diagnosis not present

## 2017-08-13 DIAGNOSIS — D631 Anemia in chronic kidney disease: Secondary | ICD-10-CM | POA: Diagnosis not present

## 2017-08-13 DIAGNOSIS — N2581 Secondary hyperparathyroidism of renal origin: Secondary | ICD-10-CM | POA: Diagnosis not present

## 2017-08-14 ENCOUNTER — Ambulatory Visit: Payer: Medicare Other | Admitting: Infectious Disease

## 2017-08-15 DIAGNOSIS — E1129 Type 2 diabetes mellitus with other diabetic kidney complication: Secondary | ICD-10-CM | POA: Diagnosis not present

## 2017-08-15 DIAGNOSIS — N186 End stage renal disease: Secondary | ICD-10-CM | POA: Diagnosis not present

## 2017-08-15 DIAGNOSIS — D631 Anemia in chronic kidney disease: Secondary | ICD-10-CM | POA: Diagnosis not present

## 2017-08-15 DIAGNOSIS — D509 Iron deficiency anemia, unspecified: Secondary | ICD-10-CM | POA: Diagnosis not present

## 2017-08-15 DIAGNOSIS — N2581 Secondary hyperparathyroidism of renal origin: Secondary | ICD-10-CM | POA: Diagnosis not present

## 2017-08-15 DIAGNOSIS — L732 Hidradenitis suppurativa: Secondary | ICD-10-CM | POA: Diagnosis not present

## 2017-08-16 ENCOUNTER — Ambulatory Visit (INDEPENDENT_AMBULATORY_CARE_PROVIDER_SITE_OTHER): Payer: Medicare Other | Admitting: Podiatry

## 2017-08-16 ENCOUNTER — Encounter: Payer: Self-pay | Admitting: Podiatry

## 2017-08-16 DIAGNOSIS — L84 Corns and callosities: Secondary | ICD-10-CM

## 2017-08-16 DIAGNOSIS — M79674 Pain in right toe(s): Secondary | ICD-10-CM

## 2017-08-16 DIAGNOSIS — B351 Tinea unguium: Secondary | ICD-10-CM | POA: Diagnosis not present

## 2017-08-16 DIAGNOSIS — E1149 Type 2 diabetes mellitus with other diabetic neurological complication: Secondary | ICD-10-CM | POA: Diagnosis not present

## 2017-08-16 DIAGNOSIS — M79675 Pain in left toe(s): Secondary | ICD-10-CM | POA: Diagnosis not present

## 2017-08-17 DIAGNOSIS — N186 End stage renal disease: Secondary | ICD-10-CM | POA: Diagnosis not present

## 2017-08-17 DIAGNOSIS — N2581 Secondary hyperparathyroidism of renal origin: Secondary | ICD-10-CM | POA: Diagnosis not present

## 2017-08-17 DIAGNOSIS — D509 Iron deficiency anemia, unspecified: Secondary | ICD-10-CM | POA: Diagnosis not present

## 2017-08-17 DIAGNOSIS — E1129 Type 2 diabetes mellitus with other diabetic kidney complication: Secondary | ICD-10-CM | POA: Diagnosis not present

## 2017-08-17 DIAGNOSIS — L732 Hidradenitis suppurativa: Secondary | ICD-10-CM | POA: Diagnosis not present

## 2017-08-17 DIAGNOSIS — D631 Anemia in chronic kidney disease: Secondary | ICD-10-CM | POA: Diagnosis not present

## 2017-08-17 NOTE — Progress Notes (Signed)
Subjective: 56 y.o. returns the office today for painful, elongated, thickened toenails which she cannot trim herself as well as for calluses to her heels. Denies any redness or drainage around the nails. She has calluses to both of her heels was to cause discomfort at times. Denies any acute changes since last appointment and no new complaints today. Denies any systemic complaints such as fevers, chills, nausea, vomiting.   Objective: AAO 3, NAD DP/PT pulses palpable, CRT less than 3 seconds Protective sensation decreased with Simms Weinstein monofilament Nails hypertrophic, dystrophic, elongated, brittle, discolored 10. There is tenderness overlying the nails 1-5 bilaterally. There is no surrounding erythema or drainage along the nail sites. Hyperkeratotic lesions to bilateral plantar heels. Upon debridement there is no underlying ulceration, drainage or any signs of infection. No open lesions or pre-ulcerative lesions are identified. No other areas of tenderness bilateral lower extremities. No overlying edema, erythema, increased warmth. No pain with calf compression, swelling, warmth, erythema.  Assessment: Patient presents with symptomatic onychomycosis, calluses 2  Plan: -Treatment options including alternatives, risks, complications were discussed -Nails sharply debrided 10 without complication/bleeding. -Hyperkeratotic lesion sharply debrided 2 without complications or bleeding. -Discussed daily foot inspection. If there are any changes, to call the office immediately.  -Follow-up in 3 months or sooner if any problems are to arise. In the meantime, encouraged to call the office with any questions, concerns, changes symptoms.  Celesta Gentile, DPM

## 2017-08-20 DIAGNOSIS — N2581 Secondary hyperparathyroidism of renal origin: Secondary | ICD-10-CM | POA: Diagnosis not present

## 2017-08-20 DIAGNOSIS — E1129 Type 2 diabetes mellitus with other diabetic kidney complication: Secondary | ICD-10-CM | POA: Diagnosis not present

## 2017-08-20 DIAGNOSIS — L732 Hidradenitis suppurativa: Secondary | ICD-10-CM | POA: Diagnosis not present

## 2017-08-20 DIAGNOSIS — N186 End stage renal disease: Secondary | ICD-10-CM | POA: Diagnosis not present

## 2017-08-20 DIAGNOSIS — D509 Iron deficiency anemia, unspecified: Secondary | ICD-10-CM | POA: Diagnosis not present

## 2017-08-20 DIAGNOSIS — D631 Anemia in chronic kidney disease: Secondary | ICD-10-CM | POA: Diagnosis not present

## 2017-08-21 ENCOUNTER — Encounter: Payer: Self-pay | Admitting: Infectious Disease

## 2017-08-21 ENCOUNTER — Ambulatory Visit (INDEPENDENT_AMBULATORY_CARE_PROVIDER_SITE_OTHER): Payer: Medicare Other | Admitting: Infectious Disease

## 2017-08-21 DIAGNOSIS — N186 End stage renal disease: Secondary | ICD-10-CM

## 2017-08-21 DIAGNOSIS — M792 Neuralgia and neuritis, unspecified: Secondary | ICD-10-CM | POA: Diagnosis not present

## 2017-08-21 DIAGNOSIS — J42 Unspecified chronic bronchitis: Secondary | ICD-10-CM | POA: Diagnosis not present

## 2017-08-21 DIAGNOSIS — Z992 Dependence on renal dialysis: Secondary | ICD-10-CM | POA: Diagnosis not present

## 2017-08-21 DIAGNOSIS — L732 Hidradenitis suppurativa: Secondary | ICD-10-CM | POA: Diagnosis present

## 2017-08-21 DIAGNOSIS — J9611 Chronic respiratory failure with hypoxia: Secondary | ICD-10-CM

## 2017-08-21 DIAGNOSIS — A419 Sepsis, unspecified organism: Secondary | ICD-10-CM | POA: Diagnosis not present

## 2017-08-21 HISTORY — DX: Neuralgia and neuritis, unspecified: M79.2

## 2017-08-21 HISTORY — DX: Dependence on renal dialysis: N18.6

## 2017-08-21 NOTE — Progress Notes (Signed)
Subjective:   Chief complaint : dyspnea   Patient ID: [name], female    DOB: 08/22/1961, 56 y.o.   MRN: [id number]  HPI  [name] is a 56 year old lady with morbid obesity hidradenitis with chronic kidney disease on hemodialysis who developed streptococcal bacteremia in September 2017when she was an inpatient and whom we treated with vancomycin for streptococcal bacteremia along with ceftaz edema and oral Flagyl to cover her at [facility] which was flaring. After she completed the IV antibiotics for hidradenitis flared again and her antibiotics were extended by Dr. Megan Salon after he saw her in clinic.  When I last saw her we changed her over to oral Bactrim with the hopes that this would provide some further coverage for organisms involved with her infection of her hidradenitis lesions. Unfortunate she developed a sensation of hearing music in both of her ears constantly even when no music was playing. This was prominent while she was taking her Bactrim but has subsided since she has stopped that and change over to doxycycline but is not completely disappeared. She has seen her audiologist but apparently not discuss this with them. It is not clear to me that her hearing has been tested while she's had this symptom.  We changed  her over to doxycycline but since then she has experienced worsening drainage from 3 areas in particular to on her left buttocks. They're quite painful and cause her to feel uncomfortable sitting.  We took cultures but these grew only corynebacterium though GNR was seen on GS. I continued doxy and added levaquin in February. She was admitted on 02/02/17 with fever, leukocytosis, SIRS, started on vanco/zosyn. She apparently refused surgical consultation.   She was changed to vancomycin an ceftazidime for ease of dosing with dialysis and she is feeling much better with no more fevers and improvement in her drainage posterior buttocks also in her inguinal area anteriorly. She did  not want me to examine these wound in March of 2018.  In the interim she experienced increased drainage from her perineum including ulcers on buttocks and from groin.  We gave her 2 week course of IV vancomycin with HD with improvement in drainage but as soon as it was stopped she experienced recurrence of bleeding, foul smelling drainge from these areas. We then went to keflex and flagyl with improvement in her drainage from buttocks and axilla.   She was dyspneic and asking for O2 when she came to clinic despite saturations being 95%.  Her mother came with her and showed me photos in lieu of [name] having to take down dressings.       Past Medical History:  Diagnosis Date  . Chronic diastolic CHF (congestive heart failure) ([place])   . Chronic kidney disease (CKD), stage III (moderate)    [name] nephrology lov note 05-12-2013 on pt chart now on HD  . DCM (dilated cardiomyopathy) ([place])    nonischemic - EF now 60-65% by echo 2017  . Depression   . DM type 2 (diabetes mellitus, type 2) ([facility])    diet controlledwith retinopathy and nephropathy  . GERD (gastroesophageal reflux disease)   . H/O cardiac arrest 10/2012  . Hard of hearing 10/2012   now wears 2 hearing aids  . History of hemodialysis dec 2013  . HTN (hypertension)   . Hydradenitis   . Hyperkalemia   . Hyperlipidemia   . Hypothyroidism   . Iron deficiency anemia   . Morbid obesity ([place])   . Multinodular goiter   .  Pneumonia Nov 10, 2012  . Sleep apnea    no test yet per mother 10/22/13   . Tinnitus 12/26/2016  . Typical atrial flutter ([place]) 04/10/2017  . Urinary tract infection    taking antibiotics for 3 days prior to surgery    Past Surgical History:  Procedure Laterality Date  . AV FISTULA PLACEMENT Left 07/25/2013   Procedure: ARTERIOVENOUS (AV) FISTULA CREATION ;  Surgeon: Rosetta Posner, MD;  Location: [place];  Service: Vascular;  Laterality: Left;  . CYSTOSCOPY W/ RETROGRADES  11/16/2012   Procedure:  CYSTOSCOPY WITH RETROGRADE PYELOGRAM;  Surgeon: Reece Packer, MD;  Location: [facility];  Service: Urology;  Laterality: Bilateral;  CYSTOSCOPY,BILATERAL RETROGRADE PYELOGRAM/ REMOVAL LEFT URETERAL STENT/ FULGERATION BLADDER MUCOSA/ INSERTION RIGHT URETERAL STENT  . CYSTOSCOPY W/ URETERAL STENT PLACEMENT  05/28/2012   Procedure: CYSTOSCOPY WITH RETROGRADE PYELOGRAM/URETERAL STENT PLACEMENT;  Surgeon: Reece Packer, MD;  Location: [facility];  Service: Urology;  Laterality: Left;  . CYSTOSCOPY WITH RETROGRADE PYELOGRAM, URETEROSCOPY AND STENT PLACEMENT Right 06/23/2013   Procedure: CYSTOSCOPY WITH RIGHT URETEROSCOPY, RIGHT  RETROGRADE PYELOGRAM, WITH LASER LIPOTRIPSY AND RIGHT URETERAL STENT EXCHANGE ;  Surgeon: Molli Hazard, MD;  Location: [facility];  Service: Urology;  Laterality: Right;  STENT EXCHANGE    . CYSTOSCOPY WITH RETROGRADE PYELOGRAM, URETEROSCOPY AND STENT PLACEMENT Right 11/06/2013   Procedure: CYSTOSCOPY WITH RETROGRADE PYELOGRAM, URETEROSCOPY STONE REMOVAL AND STENT REMOVAL;  Surgeon: Molli Hazard, MD;  Location: [facility];  Service: Urology;  Laterality: Right;  . HOLMIUM LASER APPLICATION N/A 1/[id number]/8295   Procedure: HOLMIUM LASER APPLICATION;  Surgeon: Molli Hazard, MD;  Location: [facility];  Service: Urology;  Laterality: N/A;  . INSERTION OF DIALYSIS CATHETER N/A 09/24/2013   Procedure: INSERTION OF DIALYSIS CATHETER;  Surgeon: Rosetta Posner, MD;  Location: [place];  Service: Vascular;  Laterality: N/A;  . PORT-A-CATH REMOVAL    . PORTACATH PLACEMENT    . REVISON OF ARTERIOVENOUS FISTULA Left 07/25/2016   Procedure: REVISON OF LEFT ARTERIOVENOUS FISTULA;  Surgeon: Elam Dutch, MD;  Location: [facility];  Service: Vascular;  Laterality: Left;    Family History  Problem Relation Age of Onset  . Coronary artery disease Mother   . Hypertension Mother   . Diabetes type II Mother   . Coronary artery disease Father   . Hypertension Father   . Malignant hyperthermia  Father   . Cancer Maternal Grandfather        ? Type  . Kidney failure Maternal Grandmother       Social History   Social History  . Marital status: Married    Spouse name: N/A  . Number of children: N/A  . Years of education: N/A   Social History Main Topics  . Smoking status: Former Smoker    Packs/day: 0.25    Years: 1.00    Types: Cigarettes    Quit date: 11/28/1979  . Smokeless tobacco: Never Used  . Alcohol use No  . Drug use: No  . Sexual activity: No   Other Topics Concern  . Not on file   Social History Narrative  . No narrative on file    Allergies  Allergen Reactions  . Rosiglitazone Other (See Comments)  . Amoxicillin Rash and Other (See Comments)    Tolerated Zosyn 01/2013. [place]  . Amoxicillin-Pot Clavulanate Diarrhea     Current Outpatient Prescriptions:  .  aspirin EC 81 MG tablet, Take 81 mg by mouth at bedtime. ,  Disp: , Rfl:  .  Azelastine-Fluticasone 137-50 MCG/ACT SUSP, Place 2 sprays into the nose daily., Disp: 1 Bottle, Rfl: 3 .  Calcium Acetate 667 MG TABS, Take 667 mg by mouth 3 (three) times daily with meals. And snacks, Disp: , Rfl:  .  cephALEXin (KEFLEX) 500 MG capsule, Take 1 capsule (500 mg total) by mouth 2 (two) times daily., Disp: 60 capsule, Rfl: 2 .  diltiazem (CARDIZEM) 30 MG tablet, Take 1 tablet every 4 hours AS NEEDED for rapid heart rate lasting more than 30 mins as long as top blood pressure >100., Disp: 30 tablet, Rfl: 2 .  DULoxetine (CYMBALTA) 60 MG capsule, Take 1 capsule (60 mg total) by mouth daily., Disp: 30 capsule, Rfl: 0 .  famotidine (PEPCID) 20 MG tablet, TAKE 1 TABLET BY MOUTH AT BEDTIME (Patient taking differently: Take 20 mg by mouth at bedtime), Disp: 30 tablet, Rfl: 0 .  feeding supplement, RESOURCE BREEZE, (RESOURCE BREEZE) LIQD, Take 2 Containers by mouth 2 (two) times daily between meals. , Disp: , Rfl:  .  glipiZIDE (GLUCOTROL) 5 MG tablet, Take 0.5 tablets (2.5 mg total) by mouth daily., Disp: 30  tablet, Rfl: 0 .  levothyroxine (SYNTHROID, LEVOTHROID) 75 MCG tablet, Take 75 mcg by mouth daily., Disp: , Rfl: 0 .  metroNIDAZOLE (FLAGYL) 500 MG tablet, TAKE 1 TABLET (500 MG TOTAL) BY MOUTH 3 (THREE) TIMES DAILY., Disp: 90 tablet, Rfl: 2 .  midodrine (PROAMATINE) 10 MG tablet, Take 1 tablet (10 mg total) by mouth 3 (three) times daily. (Patient taking differently: Take 10 mg by mouth every Monday, Wednesday, and Friday. Dialysis days), Disp: 90 tablet, Rfl: 0 .  multivitamin (RENA-VIT) TABS tablet, Take 1 tablet by mouth daily., Disp: , Rfl: 6 .  oxyCODONE-acetaminophen (PERCOCET/ROXICET) 5-325 MG tablet, Take 1-2 tablets by mouth every 6 (six) hours as needed. Patient has not taken since started dialysis 08/2013 due to causes hypotension (Patient taking differently: Take 1-2 tablets by mouth every 6 (six) hours as needed for moderate pain. ), Disp: 6 tablet, Rfl: 0 .  pravastatin (PRAVACHOL) 40 MG tablet, Take 40 mg by mouth daily. Patient takes in am, Disp: , Rfl:  .  Probiotic Product (PROBIOTIC DAILY PO), Take 1 tablet by mouth daily. , Disp: , Rfl:  .  sevelamer carbonate (RENVELA) 800 MG tablet, Take 2,400 mg by mouth 3 (three) times daily with meals., Disp: , Rfl:  .  sucroferric oxyhydroxide (VELPHORO) 500 MG chewable tablet, Chew 500 mg by mouth 3 (three) times daily with meals., Disp: , Rfl:     Review of Systems  Constitutional: Negative for chills, diaphoresis and fever.  HENT: Negative for congestion, hearing loss, sore throat and tinnitus.   Respiratory: Positive for shortness of breath. Negative for cough and wheezing.   Cardiovascular: Negative for chest pain and leg swelling.  Gastrointestinal: Negative for abdominal pain, blood in stool, constipation, diarrhea, nausea and vomiting.  Genitourinary: Negative for dysuria, flank pain and hematuria.  Musculoskeletal: Negative for back pain and myalgias.  Skin: Positive for color change and wound.  Neurological: Negative for  dizziness, weakness and headaches.  Hematological: Does not bruise/bleed easily.  Psychiatric/Behavioral: Negative for suicidal ideas. The patient is nervous/anxious.        Objective:   Physical Exam  Constitutional: She is oriented to person, place, and time. She appears well-developed and well-nourished. No distress.  HENT:  Head: Normocephalic and atraumatic.  Eyes: Conjunctivae and EOM are normal. No scleral icterus.  Neck: Normal  range of motion. Neck supple.  Cardiovascular: Normal rate and normal heart sounds.  An irregularly irregular rhythm present. Exam reveals no gallop and no friction rub.   No murmur heard. Pulmonary/Chest: No respiratory distress. She has no wheezes.  Breathing through pursed lips and anxious but saturations at 95%  Abdominal: She exhibits no distension.  Musculoskeletal: She exhibits edema.  Neurological: She is alert and oriented to person, place, and time. Coordination normal.  Skin: Skin is warm and dry. She is not diaphoretic.  Did not examine HS lesions due to her discomorfort and per her request. I did examine photos taken by her mother  Psychiatric: She has a normal mood and affect. Her behavior is normal. Judgment and thought content normal.          Assessment & Plan:   Hidradenitis suppurativa:  Keep going with keflex and flagyl for now  Then give her IV vancomycin with HD 1 gram from October 1st through 17th just prior to her trip  Then dc keflex but continue flagyl  It is too bad there is apparently no Garment/textile technologist that can help her or at [facility] in terms of more definitive therapy. Same thing in the world of Dermatology it appears  Morbid obesity: difficult if not nearly impossible to treat for [name]  ESRD on HD   COPD: she did not bring her portable O2 but her saturations were fine. 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

## 2017-08-22 DIAGNOSIS — E1129 Type 2 diabetes mellitus with other diabetic kidney complication: Secondary | ICD-10-CM | POA: Diagnosis not present

## 2017-08-22 DIAGNOSIS — D631 Anemia in chronic kidney disease: Secondary | ICD-10-CM | POA: Diagnosis not present

## 2017-08-22 DIAGNOSIS — L732 Hidradenitis suppurativa: Secondary | ICD-10-CM | POA: Diagnosis not present

## 2017-08-22 DIAGNOSIS — D509 Iron deficiency anemia, unspecified: Secondary | ICD-10-CM | POA: Diagnosis not present

## 2017-08-22 DIAGNOSIS — N186 End stage renal disease: Secondary | ICD-10-CM | POA: Diagnosis not present

## 2017-08-22 DIAGNOSIS — N2581 Secondary hyperparathyroidism of renal origin: Secondary | ICD-10-CM | POA: Diagnosis not present

## 2017-08-24 DIAGNOSIS — E1129 Type 2 diabetes mellitus with other diabetic kidney complication: Secondary | ICD-10-CM | POA: Diagnosis not present

## 2017-08-24 DIAGNOSIS — L732 Hidradenitis suppurativa: Secondary | ICD-10-CM | POA: Diagnosis not present

## 2017-08-24 DIAGNOSIS — D509 Iron deficiency anemia, unspecified: Secondary | ICD-10-CM | POA: Diagnosis not present

## 2017-08-24 DIAGNOSIS — D631 Anemia in chronic kidney disease: Secondary | ICD-10-CM | POA: Diagnosis not present

## 2017-08-24 DIAGNOSIS — N186 End stage renal disease: Secondary | ICD-10-CM | POA: Diagnosis not present

## 2017-08-24 DIAGNOSIS — N2581 Secondary hyperparathyroidism of renal origin: Secondary | ICD-10-CM | POA: Diagnosis not present

## 2017-08-26 DIAGNOSIS — Z992 Dependence on renal dialysis: Secondary | ICD-10-CM | POA: Diagnosis not present

## 2017-08-26 DIAGNOSIS — N186 End stage renal disease: Secondary | ICD-10-CM | POA: Diagnosis not present

## 2017-08-26 DIAGNOSIS — E1129 Type 2 diabetes mellitus with other diabetic kidney complication: Secondary | ICD-10-CM | POA: Diagnosis not present

## 2017-08-27 DIAGNOSIS — N2581 Secondary hyperparathyroidism of renal origin: Secondary | ICD-10-CM | POA: Diagnosis not present

## 2017-08-27 DIAGNOSIS — D631 Anemia in chronic kidney disease: Secondary | ICD-10-CM | POA: Diagnosis not present

## 2017-08-27 DIAGNOSIS — D509 Iron deficiency anemia, unspecified: Secondary | ICD-10-CM | POA: Diagnosis not present

## 2017-08-27 DIAGNOSIS — N186 End stage renal disease: Secondary | ICD-10-CM | POA: Diagnosis not present

## 2017-08-27 DIAGNOSIS — L732 Hidradenitis suppurativa: Secondary | ICD-10-CM | POA: Diagnosis not present

## 2017-08-27 DIAGNOSIS — E1129 Type 2 diabetes mellitus with other diabetic kidney complication: Secondary | ICD-10-CM | POA: Diagnosis not present

## 2017-08-29 DIAGNOSIS — E1129 Type 2 diabetes mellitus with other diabetic kidney complication: Secondary | ICD-10-CM | POA: Diagnosis not present

## 2017-08-29 DIAGNOSIS — D631 Anemia in chronic kidney disease: Secondary | ICD-10-CM | POA: Diagnosis not present

## 2017-08-29 DIAGNOSIS — N186 End stage renal disease: Secondary | ICD-10-CM | POA: Diagnosis not present

## 2017-08-29 DIAGNOSIS — D509 Iron deficiency anemia, unspecified: Secondary | ICD-10-CM | POA: Diagnosis not present

## 2017-08-29 DIAGNOSIS — N2581 Secondary hyperparathyroidism of renal origin: Secondary | ICD-10-CM | POA: Diagnosis not present

## 2017-08-29 DIAGNOSIS — L732 Hidradenitis suppurativa: Secondary | ICD-10-CM | POA: Diagnosis not present

## 2017-08-31 DIAGNOSIS — E1129 Type 2 diabetes mellitus with other diabetic kidney complication: Secondary | ICD-10-CM | POA: Diagnosis not present

## 2017-08-31 DIAGNOSIS — L732 Hidradenitis suppurativa: Secondary | ICD-10-CM | POA: Diagnosis not present

## 2017-08-31 DIAGNOSIS — D509 Iron deficiency anemia, unspecified: Secondary | ICD-10-CM | POA: Diagnosis not present

## 2017-08-31 DIAGNOSIS — N186 End stage renal disease: Secondary | ICD-10-CM | POA: Diagnosis not present

## 2017-08-31 DIAGNOSIS — D631 Anemia in chronic kidney disease: Secondary | ICD-10-CM | POA: Diagnosis not present

## 2017-08-31 DIAGNOSIS — N2581 Secondary hyperparathyroidism of renal origin: Secondary | ICD-10-CM | POA: Diagnosis not present

## 2017-09-03 ENCOUNTER — Telehealth: Payer: Self-pay | Admitting: *Deleted

## 2017-09-03 ENCOUNTER — Other Ambulatory Visit: Payer: Self-pay | Admitting: Pharmacist

## 2017-09-03 DIAGNOSIS — L732 Hidradenitis suppurativa: Secondary | ICD-10-CM | POA: Diagnosis not present

## 2017-09-03 DIAGNOSIS — D509 Iron deficiency anemia, unspecified: Secondary | ICD-10-CM | POA: Diagnosis not present

## 2017-09-03 DIAGNOSIS — E1129 Type 2 diabetes mellitus with other diabetic kidney complication: Secondary | ICD-10-CM | POA: Diagnosis not present

## 2017-09-03 DIAGNOSIS — N2581 Secondary hyperparathyroidism of renal origin: Secondary | ICD-10-CM | POA: Diagnosis not present

## 2017-09-03 DIAGNOSIS — N186 End stage renal disease: Secondary | ICD-10-CM | POA: Diagnosis not present

## 2017-09-03 DIAGNOSIS — D631 Anemia in chronic kidney disease: Secondary | ICD-10-CM | POA: Diagnosis not present

## 2017-09-03 NOTE — Telephone Encounter (Signed)
Yes please let her continue this up until she gets ready to go on her trip

## 2017-09-03 NOTE — Telephone Encounter (Signed)
Patient's mother called stating that dialysis said she was given last dose of vancomycin on 08/31/17. Called [facility] and spoke toTamra, RN and advised that Dr. Derek Mound office note stated patient was to receive one gram vancomycin at dialysis through 09/12/17.

## 2017-09-05 DIAGNOSIS — E1129 Type 2 diabetes mellitus with other diabetic kidney complication: Secondary | ICD-10-CM | POA: Diagnosis not present

## 2017-09-05 DIAGNOSIS — D631 Anemia in chronic kidney disease: Secondary | ICD-10-CM | POA: Diagnosis not present

## 2017-09-05 DIAGNOSIS — D509 Iron deficiency anemia, unspecified: Secondary | ICD-10-CM | POA: Diagnosis not present

## 2017-09-05 DIAGNOSIS — N2581 Secondary hyperparathyroidism of renal origin: Secondary | ICD-10-CM | POA: Diagnosis not present

## 2017-09-05 DIAGNOSIS — L732 Hidradenitis suppurativa: Secondary | ICD-10-CM | POA: Diagnosis not present

## 2017-09-05 DIAGNOSIS — N186 End stage renal disease: Secondary | ICD-10-CM | POA: Diagnosis not present

## 2017-09-07 DIAGNOSIS — N186 End stage renal disease: Secondary | ICD-10-CM | POA: Diagnosis not present

## 2017-09-07 DIAGNOSIS — D631 Anemia in chronic kidney disease: Secondary | ICD-10-CM | POA: Diagnosis not present

## 2017-09-07 DIAGNOSIS — L732 Hidradenitis suppurativa: Secondary | ICD-10-CM | POA: Diagnosis not present

## 2017-09-07 DIAGNOSIS — N2581 Secondary hyperparathyroidism of renal origin: Secondary | ICD-10-CM | POA: Diagnosis not present

## 2017-09-07 DIAGNOSIS — E1129 Type 2 diabetes mellitus with other diabetic kidney complication: Secondary | ICD-10-CM | POA: Diagnosis not present

## 2017-09-07 DIAGNOSIS — D509 Iron deficiency anemia, unspecified: Secondary | ICD-10-CM | POA: Diagnosis not present

## 2017-09-10 DIAGNOSIS — N2581 Secondary hyperparathyroidism of renal origin: Secondary | ICD-10-CM | POA: Diagnosis not present

## 2017-09-10 DIAGNOSIS — E1129 Type 2 diabetes mellitus with other diabetic kidney complication: Secondary | ICD-10-CM | POA: Diagnosis not present

## 2017-09-10 DIAGNOSIS — N186 End stage renal disease: Secondary | ICD-10-CM | POA: Diagnosis not present

## 2017-09-10 DIAGNOSIS — L732 Hidradenitis suppurativa: Secondary | ICD-10-CM | POA: Diagnosis not present

## 2017-09-10 DIAGNOSIS — D631 Anemia in chronic kidney disease: Secondary | ICD-10-CM | POA: Diagnosis not present

## 2017-09-10 DIAGNOSIS — D509 Iron deficiency anemia, unspecified: Secondary | ICD-10-CM | POA: Diagnosis not present

## 2017-09-12 DIAGNOSIS — N186 End stage renal disease: Secondary | ICD-10-CM | POA: Diagnosis not present

## 2017-09-12 DIAGNOSIS — D509 Iron deficiency anemia, unspecified: Secondary | ICD-10-CM | POA: Diagnosis not present

## 2017-09-12 DIAGNOSIS — N2581 Secondary hyperparathyroidism of renal origin: Secondary | ICD-10-CM | POA: Diagnosis not present

## 2017-09-12 DIAGNOSIS — D631 Anemia in chronic kidney disease: Secondary | ICD-10-CM | POA: Diagnosis not present

## 2017-09-12 DIAGNOSIS — E1129 Type 2 diabetes mellitus with other diabetic kidney complication: Secondary | ICD-10-CM | POA: Diagnosis not present

## 2017-09-12 DIAGNOSIS — L732 Hidradenitis suppurativa: Secondary | ICD-10-CM | POA: Diagnosis not present

## 2017-09-13 DIAGNOSIS — D631 Anemia in chronic kidney disease: Secondary | ICD-10-CM | POA: Diagnosis not present

## 2017-09-13 DIAGNOSIS — N186 End stage renal disease: Secondary | ICD-10-CM | POA: Diagnosis not present

## 2017-09-13 DIAGNOSIS — E1129 Type 2 diabetes mellitus with other diabetic kidney complication: Secondary | ICD-10-CM | POA: Diagnosis not present

## 2017-09-13 DIAGNOSIS — D509 Iron deficiency anemia, unspecified: Secondary | ICD-10-CM | POA: Diagnosis not present

## 2017-09-13 DIAGNOSIS — N2581 Secondary hyperparathyroidism of renal origin: Secondary | ICD-10-CM | POA: Diagnosis not present

## 2017-09-13 DIAGNOSIS — L732 Hidradenitis suppurativa: Secondary | ICD-10-CM | POA: Diagnosis not present

## 2017-09-18 DIAGNOSIS — D509 Iron deficiency anemia, unspecified: Secondary | ICD-10-CM | POA: Diagnosis not present

## 2017-09-18 DIAGNOSIS — D631 Anemia in chronic kidney disease: Secondary | ICD-10-CM | POA: Diagnosis not present

## 2017-09-18 DIAGNOSIS — N186 End stage renal disease: Secondary | ICD-10-CM | POA: Diagnosis not present

## 2017-09-18 DIAGNOSIS — N2581 Secondary hyperparathyroidism of renal origin: Secondary | ICD-10-CM | POA: Diagnosis not present

## 2017-09-18 DIAGNOSIS — L732 Hidradenitis suppurativa: Secondary | ICD-10-CM | POA: Diagnosis not present

## 2017-09-18 DIAGNOSIS — E1129 Type 2 diabetes mellitus with other diabetic kidney complication: Secondary | ICD-10-CM | POA: Diagnosis not present

## 2017-09-19 DIAGNOSIS — N186 End stage renal disease: Secondary | ICD-10-CM | POA: Diagnosis not present

## 2017-09-19 DIAGNOSIS — E1129 Type 2 diabetes mellitus with other diabetic kidney complication: Secondary | ICD-10-CM | POA: Diagnosis not present

## 2017-09-19 DIAGNOSIS — N2581 Secondary hyperparathyroidism of renal origin: Secondary | ICD-10-CM | POA: Diagnosis not present

## 2017-09-19 DIAGNOSIS — D509 Iron deficiency anemia, unspecified: Secondary | ICD-10-CM | POA: Diagnosis not present

## 2017-09-19 DIAGNOSIS — D631 Anemia in chronic kidney disease: Secondary | ICD-10-CM | POA: Diagnosis not present

## 2017-09-19 DIAGNOSIS — L732 Hidradenitis suppurativa: Secondary | ICD-10-CM | POA: Diagnosis not present

## 2017-09-21 DIAGNOSIS — D509 Iron deficiency anemia, unspecified: Secondary | ICD-10-CM | POA: Diagnosis not present

## 2017-09-21 DIAGNOSIS — N2581 Secondary hyperparathyroidism of renal origin: Secondary | ICD-10-CM | POA: Diagnosis not present

## 2017-09-21 DIAGNOSIS — D631 Anemia in chronic kidney disease: Secondary | ICD-10-CM | POA: Diagnosis not present

## 2017-09-21 DIAGNOSIS — L732 Hidradenitis suppurativa: Secondary | ICD-10-CM | POA: Diagnosis not present

## 2017-09-21 DIAGNOSIS — N186 End stage renal disease: Secondary | ICD-10-CM | POA: Diagnosis not present

## 2017-09-21 DIAGNOSIS — E1129 Type 2 diabetes mellitus with other diabetic kidney complication: Secondary | ICD-10-CM | POA: Diagnosis not present

## 2017-09-24 DIAGNOSIS — E1129 Type 2 diabetes mellitus with other diabetic kidney complication: Secondary | ICD-10-CM | POA: Diagnosis not present

## 2017-09-24 DIAGNOSIS — N2581 Secondary hyperparathyroidism of renal origin: Secondary | ICD-10-CM | POA: Diagnosis not present

## 2017-09-24 DIAGNOSIS — N186 End stage renal disease: Secondary | ICD-10-CM | POA: Diagnosis not present

## 2017-09-24 DIAGNOSIS — L732 Hidradenitis suppurativa: Secondary | ICD-10-CM | POA: Diagnosis not present

## 2017-09-24 DIAGNOSIS — D509 Iron deficiency anemia, unspecified: Secondary | ICD-10-CM | POA: Diagnosis not present

## 2017-09-24 DIAGNOSIS — D631 Anemia in chronic kidney disease: Secondary | ICD-10-CM | POA: Diagnosis not present

## 2017-09-26 DIAGNOSIS — D509 Iron deficiency anemia, unspecified: Secondary | ICD-10-CM | POA: Diagnosis not present

## 2017-09-26 DIAGNOSIS — N186 End stage renal disease: Secondary | ICD-10-CM | POA: Diagnosis not present

## 2017-09-26 DIAGNOSIS — D631 Anemia in chronic kidney disease: Secondary | ICD-10-CM | POA: Diagnosis not present

## 2017-09-26 DIAGNOSIS — Z992 Dependence on renal dialysis: Secondary | ICD-10-CM | POA: Diagnosis not present

## 2017-09-26 DIAGNOSIS — E1129 Type 2 diabetes mellitus with other diabetic kidney complication: Secondary | ICD-10-CM | POA: Diagnosis not present

## 2017-09-26 DIAGNOSIS — L732 Hidradenitis suppurativa: Secondary | ICD-10-CM | POA: Diagnosis not present

## 2017-09-26 DIAGNOSIS — N2581 Secondary hyperparathyroidism of renal origin: Secondary | ICD-10-CM | POA: Diagnosis not present

## 2017-09-28 DIAGNOSIS — N186 End stage renal disease: Secondary | ICD-10-CM | POA: Diagnosis not present

## 2017-09-28 DIAGNOSIS — N2581 Secondary hyperparathyroidism of renal origin: Secondary | ICD-10-CM | POA: Diagnosis not present

## 2017-09-28 DIAGNOSIS — D509 Iron deficiency anemia, unspecified: Secondary | ICD-10-CM | POA: Diagnosis not present

## 2017-09-28 DIAGNOSIS — D631 Anemia in chronic kidney disease: Secondary | ICD-10-CM | POA: Diagnosis not present

## 2017-09-28 DIAGNOSIS — E1129 Type 2 diabetes mellitus with other diabetic kidney complication: Secondary | ICD-10-CM | POA: Diagnosis not present

## 2017-10-01 DIAGNOSIS — N186 End stage renal disease: Secondary | ICD-10-CM | POA: Diagnosis not present

## 2017-10-01 DIAGNOSIS — E1129 Type 2 diabetes mellitus with other diabetic kidney complication: Secondary | ICD-10-CM | POA: Diagnosis not present

## 2017-10-01 DIAGNOSIS — D631 Anemia in chronic kidney disease: Secondary | ICD-10-CM | POA: Diagnosis not present

## 2017-10-01 DIAGNOSIS — N2581 Secondary hyperparathyroidism of renal origin: Secondary | ICD-10-CM | POA: Diagnosis not present

## 2017-10-01 DIAGNOSIS — D509 Iron deficiency anemia, unspecified: Secondary | ICD-10-CM | POA: Diagnosis not present

## 2017-10-03 DIAGNOSIS — N186 End stage renal disease: Secondary | ICD-10-CM | POA: Diagnosis not present

## 2017-10-03 DIAGNOSIS — E1129 Type 2 diabetes mellitus with other diabetic kidney complication: Secondary | ICD-10-CM | POA: Diagnosis not present

## 2017-10-03 DIAGNOSIS — N2581 Secondary hyperparathyroidism of renal origin: Secondary | ICD-10-CM | POA: Diagnosis not present

## 2017-10-03 DIAGNOSIS — D631 Anemia in chronic kidney disease: Secondary | ICD-10-CM | POA: Diagnosis not present

## 2017-10-03 DIAGNOSIS — D509 Iron deficiency anemia, unspecified: Secondary | ICD-10-CM | POA: Diagnosis not present

## 2017-10-05 DIAGNOSIS — N186 End stage renal disease: Secondary | ICD-10-CM | POA: Diagnosis not present

## 2017-10-05 DIAGNOSIS — N2581 Secondary hyperparathyroidism of renal origin: Secondary | ICD-10-CM | POA: Diagnosis not present

## 2017-10-05 DIAGNOSIS — D509 Iron deficiency anemia, unspecified: Secondary | ICD-10-CM | POA: Diagnosis not present

## 2017-10-05 DIAGNOSIS — D631 Anemia in chronic kidney disease: Secondary | ICD-10-CM | POA: Diagnosis not present

## 2017-10-05 DIAGNOSIS — E1129 Type 2 diabetes mellitus with other diabetic kidney complication: Secondary | ICD-10-CM | POA: Diagnosis not present

## 2017-10-08 DIAGNOSIS — N2581 Secondary hyperparathyroidism of renal origin: Secondary | ICD-10-CM | POA: Diagnosis not present

## 2017-10-08 DIAGNOSIS — D509 Iron deficiency anemia, unspecified: Secondary | ICD-10-CM | POA: Diagnosis not present

## 2017-10-08 DIAGNOSIS — D631 Anemia in chronic kidney disease: Secondary | ICD-10-CM | POA: Diagnosis not present

## 2017-10-08 DIAGNOSIS — N186 End stage renal disease: Secondary | ICD-10-CM | POA: Diagnosis not present

## 2017-10-08 DIAGNOSIS — E1129 Type 2 diabetes mellitus with other diabetic kidney complication: Secondary | ICD-10-CM | POA: Diagnosis not present

## 2017-10-10 DIAGNOSIS — N2581 Secondary hyperparathyroidism of renal origin: Secondary | ICD-10-CM | POA: Diagnosis not present

## 2017-10-10 DIAGNOSIS — D631 Anemia in chronic kidney disease: Secondary | ICD-10-CM | POA: Diagnosis not present

## 2017-10-10 DIAGNOSIS — N186 End stage renal disease: Secondary | ICD-10-CM | POA: Diagnosis not present

## 2017-10-10 DIAGNOSIS — E1129 Type 2 diabetes mellitus with other diabetic kidney complication: Secondary | ICD-10-CM | POA: Diagnosis not present

## 2017-10-10 DIAGNOSIS — D509 Iron deficiency anemia, unspecified: Secondary | ICD-10-CM | POA: Diagnosis not present

## 2017-10-12 DIAGNOSIS — N186 End stage renal disease: Secondary | ICD-10-CM | POA: Diagnosis not present

## 2017-10-12 DIAGNOSIS — N2581 Secondary hyperparathyroidism of renal origin: Secondary | ICD-10-CM | POA: Diagnosis not present

## 2017-10-12 DIAGNOSIS — E1129 Type 2 diabetes mellitus with other diabetic kidney complication: Secondary | ICD-10-CM | POA: Diagnosis not present

## 2017-10-12 DIAGNOSIS — D509 Iron deficiency anemia, unspecified: Secondary | ICD-10-CM | POA: Diagnosis not present

## 2017-10-12 DIAGNOSIS — D631 Anemia in chronic kidney disease: Secondary | ICD-10-CM | POA: Diagnosis not present

## 2017-10-14 DIAGNOSIS — N2581 Secondary hyperparathyroidism of renal origin: Secondary | ICD-10-CM | POA: Diagnosis not present

## 2017-10-14 DIAGNOSIS — D631 Anemia in chronic kidney disease: Secondary | ICD-10-CM | POA: Diagnosis not present

## 2017-10-14 DIAGNOSIS — E1129 Type 2 diabetes mellitus with other diabetic kidney complication: Secondary | ICD-10-CM | POA: Diagnosis not present

## 2017-10-14 DIAGNOSIS — D509 Iron deficiency anemia, unspecified: Secondary | ICD-10-CM | POA: Diagnosis not present

## 2017-10-14 DIAGNOSIS — N186 End stage renal disease: Secondary | ICD-10-CM | POA: Diagnosis not present

## 2017-10-16 DIAGNOSIS — N2581 Secondary hyperparathyroidism of renal origin: Secondary | ICD-10-CM | POA: Diagnosis not present

## 2017-10-16 DIAGNOSIS — D631 Anemia in chronic kidney disease: Secondary | ICD-10-CM | POA: Diagnosis not present

## 2017-10-16 DIAGNOSIS — D509 Iron deficiency anemia, unspecified: Secondary | ICD-10-CM | POA: Diagnosis not present

## 2017-10-16 DIAGNOSIS — E1129 Type 2 diabetes mellitus with other diabetic kidney complication: Secondary | ICD-10-CM | POA: Diagnosis not present

## 2017-10-16 DIAGNOSIS — N186 End stage renal disease: Secondary | ICD-10-CM | POA: Diagnosis not present

## 2017-10-19 DIAGNOSIS — N2581 Secondary hyperparathyroidism of renal origin: Secondary | ICD-10-CM | POA: Diagnosis not present

## 2017-10-19 DIAGNOSIS — N186 End stage renal disease: Secondary | ICD-10-CM | POA: Diagnosis not present

## 2017-10-19 DIAGNOSIS — D631 Anemia in chronic kidney disease: Secondary | ICD-10-CM | POA: Diagnosis not present

## 2017-10-19 DIAGNOSIS — E1129 Type 2 diabetes mellitus with other diabetic kidney complication: Secondary | ICD-10-CM | POA: Diagnosis not present

## 2017-10-19 DIAGNOSIS — D509 Iron deficiency anemia, unspecified: Secondary | ICD-10-CM | POA: Diagnosis not present

## 2017-10-22 DIAGNOSIS — D631 Anemia in chronic kidney disease: Secondary | ICD-10-CM | POA: Diagnosis not present

## 2017-10-22 DIAGNOSIS — N2581 Secondary hyperparathyroidism of renal origin: Secondary | ICD-10-CM | POA: Diagnosis not present

## 2017-10-22 DIAGNOSIS — E1129 Type 2 diabetes mellitus with other diabetic kidney complication: Secondary | ICD-10-CM | POA: Diagnosis not present

## 2017-10-22 DIAGNOSIS — D509 Iron deficiency anemia, unspecified: Secondary | ICD-10-CM | POA: Diagnosis not present

## 2017-10-22 DIAGNOSIS — N186 End stage renal disease: Secondary | ICD-10-CM | POA: Diagnosis not present

## 2017-10-23 DIAGNOSIS — T82858A Stenosis of vascular prosthetic devices, implants and grafts, initial encounter: Secondary | ICD-10-CM | POA: Diagnosis not present

## 2017-10-23 DIAGNOSIS — I871 Compression of vein: Secondary | ICD-10-CM | POA: Diagnosis not present

## 2017-10-23 DIAGNOSIS — Z992 Dependence on renal dialysis: Secondary | ICD-10-CM | POA: Diagnosis not present

## 2017-10-23 DIAGNOSIS — N186 End stage renal disease: Secondary | ICD-10-CM | POA: Diagnosis not present

## 2017-10-24 DIAGNOSIS — D509 Iron deficiency anemia, unspecified: Secondary | ICD-10-CM | POA: Diagnosis not present

## 2017-10-24 DIAGNOSIS — E1129 Type 2 diabetes mellitus with other diabetic kidney complication: Secondary | ICD-10-CM | POA: Diagnosis not present

## 2017-10-24 DIAGNOSIS — N186 End stage renal disease: Secondary | ICD-10-CM | POA: Diagnosis not present

## 2017-10-24 DIAGNOSIS — D631 Anemia in chronic kidney disease: Secondary | ICD-10-CM | POA: Diagnosis not present

## 2017-10-24 DIAGNOSIS — N2581 Secondary hyperparathyroidism of renal origin: Secondary | ICD-10-CM | POA: Diagnosis not present

## 2017-10-26 DIAGNOSIS — E1129 Type 2 diabetes mellitus with other diabetic kidney complication: Secondary | ICD-10-CM | POA: Diagnosis not present

## 2017-10-26 DIAGNOSIS — D509 Iron deficiency anemia, unspecified: Secondary | ICD-10-CM | POA: Diagnosis not present

## 2017-10-26 DIAGNOSIS — Z992 Dependence on renal dialysis: Secondary | ICD-10-CM | POA: Diagnosis not present

## 2017-10-26 DIAGNOSIS — N186 End stage renal disease: Secondary | ICD-10-CM | POA: Diagnosis not present

## 2017-10-26 DIAGNOSIS — D631 Anemia in chronic kidney disease: Secondary | ICD-10-CM | POA: Diagnosis not present

## 2017-10-26 DIAGNOSIS — N2581 Secondary hyperparathyroidism of renal origin: Secondary | ICD-10-CM | POA: Diagnosis not present

## 2017-10-29 DIAGNOSIS — D631 Anemia in chronic kidney disease: Secondary | ICD-10-CM | POA: Diagnosis not present

## 2017-10-29 DIAGNOSIS — N186 End stage renal disease: Secondary | ICD-10-CM | POA: Diagnosis not present

## 2017-10-29 DIAGNOSIS — D509 Iron deficiency anemia, unspecified: Secondary | ICD-10-CM | POA: Diagnosis not present

## 2017-10-29 DIAGNOSIS — N2581 Secondary hyperparathyroidism of renal origin: Secondary | ICD-10-CM | POA: Diagnosis not present

## 2017-10-29 DIAGNOSIS — E1129 Type 2 diabetes mellitus with other diabetic kidney complication: Secondary | ICD-10-CM | POA: Diagnosis not present

## 2017-10-30 ENCOUNTER — Ambulatory Visit (INDEPENDENT_AMBULATORY_CARE_PROVIDER_SITE_OTHER): Payer: Medicare Other | Admitting: Infectious Disease

## 2017-10-30 VITALS — BP 129/76 | HR 102 | Temp 97.6°F

## 2017-10-30 DIAGNOSIS — N186 End stage renal disease: Secondary | ICD-10-CM | POA: Diagnosis not present

## 2017-10-30 DIAGNOSIS — Z992 Dependence on renal dialysis: Secondary | ICD-10-CM | POA: Diagnosis not present

## 2017-10-30 DIAGNOSIS — L732 Hidradenitis suppurativa: Secondary | ICD-10-CM | POA: Diagnosis not present

## 2017-10-30 DIAGNOSIS — A419 Sepsis, unspecified organism: Secondary | ICD-10-CM | POA: Diagnosis not present

## 2017-10-30 DIAGNOSIS — G629 Polyneuropathy, unspecified: Secondary | ICD-10-CM

## 2017-10-30 NOTE — Progress Notes (Signed)
Subjective:   Chief complaint : followup for HS and superinfection   Patient ID: [name], female    DOB: Oct 27, 1961, 56 y.o.   MRN: [id number]  HPI  [name] is a 56 year old lady with morbid obesity hidradenitis with chronic kidney disease on hemodialysis who developed streptococcal bacteremia in September 2017when she was an inpatient and whom we treated with vancomycin for streptococcal bacteremia along with ceftaz edema and oral Flagyl to cover her at [facility] which was flaring. After she completed the IV antibiotics for hidradenitis flared again and her antibiotics were extended by Dr. Megan Salon after he saw her in clinic.  When I last saw her we changed her over to oral Bactrim with the hopes that this would provide some further coverage for organisms involved with her infection of her hidradenitis lesions. Unfortunate she developed a sensation of hearing music in both of her ears constantly even when no music was playing. This was prominent while she was taking her Bactrim but has subsided since she has stopped that and change over to doxycycline but is not completely disappeared. She has seen her audiologist but apparently not discuss this with them. It is not clear to me that her hearing has been tested while she's had this symptom.  We changed  her over to doxycycline but since then she has experienced worsening drainage from 3 areas in particular to on her left buttocks. They're quite painful and cause her to feel uncomfortable sitting.  We took cultures but these grew only corynebacterium though GNR was seen on GS. I continued doxy and added levaquin in February.  She was admitted on 02/02/17 with fever, leukocytosis, SIRS, started on vanco/zosyn. She apparently refused surgical consultation.   She was changed to vancomycin an ceftazidime for ease of dosing with dialysis and she is feeling much better with no more fevers and improvement in her drainage posterior buttocks also in her inguinal  area anteriorly. She did not want me to examine these wound in March of 2018.  In the interim she experienced increased drainage from her perineum including ulcers on buttocks and from groin.  We gave her 2 week course of IV vancomycin with HD with improvement in drainage but as soon as it was stopped she experienced recurrence of bleeding, foul smelling drainge from these areas. We then went to keflex and flagyl with improvement in her drainage from buttocks and axilla.   At last visit we gave her another course of IV vancomycin with HD prior to a vacation at the beach that she went on and her drainage improved with vancomycin.  When she got back he mother and she tried to have her off of abx but she had worsening of drainage. She restarted the keflex and flagyl and has had improvement and stability now in drainage and size of lesions.   Mom showed me photos of lesions from thigh, vaginal area and buttocks that are stable. One around buttocks does still have fairly sig drainage.  [name] feels the best she has in sometime.      Past Medical History:  Diagnosis Date  . Chronic diastolic CHF (congestive heart failure) ([place])   . Chronic kidney disease (CKD), stage III (moderate)    [name] nephrology lov note 05-12-2013 on pt chart now on HD  . DCM (dilated cardiomyopathy) ([place])    nonischemic - EF now 60-65% by echo 2017  . Depression   . DM type 2 (diabetes mellitus, type 2) ([place])    diet  controlledwith retinopathy and nephropathy  . ESRD on hemodialysis ([place]) 08/21/2017  . GERD (gastroesophageal reflux disease)   . H/O cardiac arrest 10/2012  . Hard of hearing 10/2012   now wears 2 hearing aids  . History of hemodialysis dec 2013  . HTN (hypertension)   . Hydradenitis   . Hyperkalemia   . Hyperlipidemia   . Hypothyroidism   . Iron deficiency anemia   . Morbid obesity ([place])   . Multinodular goiter   . Neuropathic pain 08/21/2017  . Pneumonia Nov 10, 2012  . Sleep apnea     no test yet per mother 10/22/13   . Tinnitus 12/26/2016  . Typical atrial flutter ([place]) 04/10/2017  . Urinary tract infection    taking antibiotics for 3 days prior to surgery    Past Surgical History:  Procedure Laterality Date  . AV FISTULA PLACEMENT Left 07/25/2013   Procedure: ARTERIOVENOUS (AV) FISTULA CREATION ;  Surgeon: Rosetta Posner, MD;  Location: [facility];  Service: Vascular;  Laterality: Left;  . CYSTOSCOPY W/ RETROGRADES  11/16/2012   Procedure: CYSTOSCOPY WITH RETROGRADE PYELOGRAM;  Surgeon: Reece Packer, MD;  Location: [facility];  Service: Urology;  Laterality: Bilateral;  CYSTOSCOPY,BILATERAL RETROGRADE PYELOGRAM/ REMOVAL LEFT URETERAL STENT/ FULGERATION BLADDER MUCOSA/ INSERTION RIGHT URETERAL STENT  . CYSTOSCOPY W/ URETERAL STENT PLACEMENT  05/28/2012   Procedure: CYSTOSCOPY WITH RETROGRADE PYELOGRAM/URETERAL STENT PLACEMENT;  Surgeon: Reece Packer, MD;  Location: [facility];  Service: Urology;  Laterality: Left;  . CYSTOSCOPY WITH RETROGRADE PYELOGRAM, URETEROSCOPY AND STENT PLACEMENT Right 06/23/2013   Procedure: CYSTOSCOPY WITH RIGHT URETEROSCOPY, RIGHT  RETROGRADE PYELOGRAM, WITH LASER LIPOTRIPSY AND RIGHT URETERAL STENT EXCHANGE ;  Surgeon: Molli Hazard, MD;  Location: [facility];  Service: Urology;  Laterality: Right;  STENT EXCHANGE    . CYSTOSCOPY WITH RETROGRADE PYELOGRAM, URETEROSCOPY AND STENT PLACEMENT Right 11/06/2013   Procedure: CYSTOSCOPY WITH RETROGRADE PYELOGRAM, URETEROSCOPY STONE REMOVAL AND STENT REMOVAL;  Surgeon: Molli Hazard, MD;  Location: [facility];  Service: Urology;  Laterality: Right;  . HOLMIUM LASER APPLICATION N/A 1/61/0960   Procedure: HOLMIUM LASER APPLICATION;  Surgeon: Molli Hazard, MD;  Location: [facility];  Service: Urology;  Laterality: N/A;  . INSERTION OF DIALYSIS CATHETER N/A 09/24/2013   Procedure: INSERTION OF DIALYSIS CATHETER;  Surgeon: Rosetta Posner, MD;  Location: [facility];  Service: Vascular;  Laterality: N/A;  .  PORT-A-CATH REMOVAL    . PORTACATH PLACEMENT    . REVISON OF ARTERIOVENOUS FISTULA Left 07/25/2016   Procedure: REVISON OF LEFT ARTERIOVENOUS FISTULA;  Surgeon: Elam Dutch, MD;  Location: [facility];  Service: Vascular;  Laterality: Left;    Family History  Problem Relation Age of Onset  . Coronary artery disease Mother   . Hypertension Mother   . Diabetes type II Mother   . Coronary artery disease Father   . Hypertension Father   . Malignant hyperthermia Father   . Cancer Maternal Grandfather        ? Type  . Kidney failure Maternal Grandmother       Social History   Socioeconomic History  . Marital status: Married    Spouse name: Not on file  . Number of children: Not on file  . Years of education: Not on file  . Highest education level: Not on file  Social Needs  . Financial resource strain: Not on file  . Food insecurity - worry: Not on file  . Food insecurity - inability: Not on file  .  Transportation needs - medical: Not on file  . Transportation needs - non-medical: Not on file  Occupational History  . Not on file  Tobacco Use  . Smoking status: Former Smoker    Packs/day: 0.25    Years: 1.00    Pack years: 0.25    Types: Cigarettes    Last attempt to quit: 11/28/1979    Years since quitting: 37.9  . Smokeless tobacco: Never Used  Substance and Sexual Activity  . Alcohol use: No  . Drug use: No  . Sexual activity: No    Birth control/protection: Abstinence  Other Topics Concern  . Not on file  Social History Narrative  . Not on file    Allergies  Allergen Reactions  . Rosiglitazone Other (See Comments)  . Amoxicillin Rash and Other (See Comments)    Tolerated Zosyn 01/2013. [facility]  . Amoxicillin-Pot Clavulanate Diarrhea     Current Outpatient Medications:  .  aspirin EC 81 MG tablet, Take 81 mg by mouth at bedtime. , Disp: , Rfl:  .  Azelastine-Fluticasone 137-50 MCG/ACT SUSP, Place 2 sprays into the nose daily., Disp: 1 Bottle, Rfl: 3 .   feeding supplement, RESOURCE BREEZE, (RESOURCE BREEZE) LIQD, Take 2 Containers by mouth 2 (two) times daily between meals. , Disp: , Rfl:  .  glipiZIDE (GLUCOTROL) 5 MG tablet, Take 0.5 tablets (2.5 mg total) by mouth daily., Disp: 30 tablet, Rfl: 0 .  levothyroxine (SYNTHROID, LEVOTHROID) 75 MCG tablet, Take 75 mcg by mouth daily., Disp: , Rfl: 0 .  metroNIDAZOLE (FLAGYL) 500 MG tablet, TAKE 1 TABLET (500 MG TOTAL) BY MOUTH 3 (THREE) TIMES DAILY., Disp: 90 tablet, Rfl: 2 .  midodrine (PROAMATINE) 10 MG tablet, Take 1 tablet (10 mg total) by mouth 3 (three) times daily. (Patient taking differently: Take 10 mg by mouth every Monday, Wednesday, and Friday. Dialysis days), Disp: 90 tablet, Rfl: 0 .  multivitamin (RENA-VIT) TABS tablet, Take 1 tablet by mouth daily., Disp: , Rfl: 6 .  pravastatin (PRAVACHOL) 40 MG tablet, Take 40 mg by mouth daily. Patient takes in am, Disp: , Rfl:  .  Probiotic Product (PROBIOTIC DAILY PO), Take 1 tablet by mouth daily. , Disp: , Rfl:  .  sevelamer carbonate (RENVELA) 800 MG tablet, Take 2,400 mg by mouth 3 (three) times daily with meals., Disp: , Rfl:  .  sucroferric oxyhydroxide (VELPHORO) 500 MG chewable tablet, Chew 500 mg by mouth 3 (three) times daily with meals., Disp: , Rfl:  .  Calcium Acetate 667 MG TABS, Take 667 mg by mouth 3 (three) times daily with meals. And snacks, Disp: , Rfl:  .  cephALEXin (KEFLEX) 500 MG capsule, Take 1 capsule (500 mg total) by mouth 2 (two) times daily., Disp: 60 capsule, Rfl: 2 .  diltiazem (CARDIZEM) 30 MG tablet, Take 1 tablet every 4 hours AS NEEDED for rapid heart rate lasting more than 30 mins as long as top blood pressure >100., Disp: 30 tablet, Rfl: 2 .  DULoxetine (CYMBALTA) 60 MG capsule, Take 1 capsule (60 mg total) by mouth daily., Disp: 30 capsule, Rfl: 0 .  famotidine (PEPCID) 20 MG tablet, TAKE 1 TABLET BY MOUTH AT BEDTIME (Patient taking differently: Take 20 mg by mouth at bedtime), Disp: 30 tablet, Rfl: 0 .   oxyCODONE-acetaminophen (PERCOCET/ROXICET) 5-325 MG tablet, Take 1-2 tablets by mouth every 6 (six) hours as needed. Patient has not taken since started dialysis 08/2013 due to causes hypotension (Patient taking differently: Take 1-2 tablets by  mouth every 6 (six) hours as needed for moderate pain. ), Disp: 6 tablet, Rfl: 0    Review of Systems  Constitutional: Negative for chills, diaphoresis and fever.  HENT: Negative for congestion, hearing loss, sore throat and tinnitus.   Respiratory: Positive for shortness of breath. Negative for cough and wheezing.   Cardiovascular: Negative for chest pain and leg swelling.  Gastrointestinal: Negative for abdominal pain, blood in stool, constipation, diarrhea, nausea and vomiting.  Genitourinary: Negative for dysuria, flank pain and hematuria.  Musculoskeletal: Negative for back pain and myalgias.  Skin: Positive for color change and wound.  Neurological: Negative for dizziness, weakness and headaches.  Hematological: Does not bruise/bleed easily.  Psychiatric/Behavioral: Negative for suicidal ideas. The patient is not nervous/anxious.        Objective:   Physical Exam  Constitutional: She is oriented to person, place, and time. She appears well-developed and well-nourished. No distress.  HENT:  Head: Normocephalic and atraumatic.  Eyes: Conjunctivae and EOM are normal. No scleral icterus.  Neck: Normal range of motion. Neck supple.  Cardiovascular: Normal rate and normal heart sounds. An irregularly irregular rhythm present. Exam reveals no gallop and no friction rub.  No murmur heard. Pulmonary/Chest: No respiratory distress. She has no wheezes.  Breathing through pursed lips and anxious but saturations at 95%  Abdominal: She exhibits no distension.  Musculoskeletal: She exhibits edema.  Neurological: She is alert and oriented to person, place, and time. Coordination normal.  Skin: Skin is warm and dry. She is not diaphoretic.  Did not  examine HS lesions due to her discomorfort and per her request. I did examine photos taken by her mother again today on December 4th, 2018  Psychiatric: She has a normal mood and affect. Her behavior is normal. Judgment and thought content normal.  Nursing note and vitals reviewed.         Assessment & Plan:   Hidradenitis suppurativa:  Keep going with keflex and flagyl   RTC in several months  She is to let us know if she develops new symptoms or worrisome drainage, other symptoms  Morbid obesity: difficult if not nearly impossible to treat for [name]  ESRD on HD   COPD: she did not bring her portable O2 but her saturations were fine. 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

## 2017-10-31 DIAGNOSIS — D509 Iron deficiency anemia, unspecified: Secondary | ICD-10-CM | POA: Diagnosis not present

## 2017-10-31 DIAGNOSIS — N186 End stage renal disease: Secondary | ICD-10-CM | POA: Diagnosis not present

## 2017-10-31 DIAGNOSIS — D631 Anemia in chronic kidney disease: Secondary | ICD-10-CM | POA: Diagnosis not present

## 2017-10-31 DIAGNOSIS — E1129 Type 2 diabetes mellitus with other diabetic kidney complication: Secondary | ICD-10-CM | POA: Diagnosis not present

## 2017-10-31 DIAGNOSIS — N2581 Secondary hyperparathyroidism of renal origin: Secondary | ICD-10-CM | POA: Diagnosis not present

## 2017-11-02 DIAGNOSIS — N186 End stage renal disease: Secondary | ICD-10-CM | POA: Diagnosis not present

## 2017-11-02 DIAGNOSIS — N2581 Secondary hyperparathyroidism of renal origin: Secondary | ICD-10-CM | POA: Diagnosis not present

## 2017-11-02 DIAGNOSIS — D631 Anemia in chronic kidney disease: Secondary | ICD-10-CM | POA: Diagnosis not present

## 2017-11-02 DIAGNOSIS — E1129 Type 2 diabetes mellitus with other diabetic kidney complication: Secondary | ICD-10-CM | POA: Diagnosis not present

## 2017-11-02 DIAGNOSIS — D509 Iron deficiency anemia, unspecified: Secondary | ICD-10-CM | POA: Diagnosis not present

## 2017-11-05 DIAGNOSIS — E1129 Type 2 diabetes mellitus with other diabetic kidney complication: Secondary | ICD-10-CM | POA: Diagnosis not present

## 2017-11-05 DIAGNOSIS — D509 Iron deficiency anemia, unspecified: Secondary | ICD-10-CM | POA: Diagnosis not present

## 2017-11-05 DIAGNOSIS — N2581 Secondary hyperparathyroidism of renal origin: Secondary | ICD-10-CM | POA: Diagnosis not present

## 2017-11-05 DIAGNOSIS — N186 End stage renal disease: Secondary | ICD-10-CM | POA: Diagnosis not present

## 2017-11-05 DIAGNOSIS — D631 Anemia in chronic kidney disease: Secondary | ICD-10-CM | POA: Diagnosis not present

## 2017-11-07 DIAGNOSIS — N186 End stage renal disease: Secondary | ICD-10-CM | POA: Diagnosis not present

## 2017-11-07 DIAGNOSIS — D509 Iron deficiency anemia, unspecified: Secondary | ICD-10-CM | POA: Diagnosis not present

## 2017-11-07 DIAGNOSIS — D631 Anemia in chronic kidney disease: Secondary | ICD-10-CM | POA: Diagnosis not present

## 2017-11-07 DIAGNOSIS — E1129 Type 2 diabetes mellitus with other diabetic kidney complication: Secondary | ICD-10-CM | POA: Diagnosis not present

## 2017-11-07 DIAGNOSIS — N2581 Secondary hyperparathyroidism of renal origin: Secondary | ICD-10-CM | POA: Diagnosis not present

## 2017-11-09 DIAGNOSIS — D509 Iron deficiency anemia, unspecified: Secondary | ICD-10-CM | POA: Diagnosis not present

## 2017-11-09 DIAGNOSIS — N2581 Secondary hyperparathyroidism of renal origin: Secondary | ICD-10-CM | POA: Diagnosis not present

## 2017-11-09 DIAGNOSIS — D631 Anemia in chronic kidney disease: Secondary | ICD-10-CM | POA: Diagnosis not present

## 2017-11-09 DIAGNOSIS — E1129 Type 2 diabetes mellitus with other diabetic kidney complication: Secondary | ICD-10-CM | POA: Diagnosis not present

## 2017-11-09 DIAGNOSIS — N186 End stage renal disease: Secondary | ICD-10-CM | POA: Diagnosis not present

## 2017-11-12 DIAGNOSIS — N2581 Secondary hyperparathyroidism of renal origin: Secondary | ICD-10-CM | POA: Diagnosis not present

## 2017-11-12 DIAGNOSIS — D509 Iron deficiency anemia, unspecified: Secondary | ICD-10-CM | POA: Diagnosis not present

## 2017-11-12 DIAGNOSIS — D631 Anemia in chronic kidney disease: Secondary | ICD-10-CM | POA: Diagnosis not present

## 2017-11-12 DIAGNOSIS — E1129 Type 2 diabetes mellitus with other diabetic kidney complication: Secondary | ICD-10-CM | POA: Diagnosis not present

## 2017-11-12 DIAGNOSIS — N186 End stage renal disease: Secondary | ICD-10-CM | POA: Diagnosis not present

## 2017-11-14 DIAGNOSIS — E1129 Type 2 diabetes mellitus with other diabetic kidney complication: Secondary | ICD-10-CM | POA: Diagnosis not present

## 2017-11-14 DIAGNOSIS — D509 Iron deficiency anemia, unspecified: Secondary | ICD-10-CM | POA: Diagnosis not present

## 2017-11-14 DIAGNOSIS — D631 Anemia in chronic kidney disease: Secondary | ICD-10-CM | POA: Diagnosis not present

## 2017-11-14 DIAGNOSIS — N2581 Secondary hyperparathyroidism of renal origin: Secondary | ICD-10-CM | POA: Diagnosis not present

## 2017-11-14 DIAGNOSIS — N186 End stage renal disease: Secondary | ICD-10-CM | POA: Diagnosis not present

## 2017-11-15 ENCOUNTER — Ambulatory Visit: Payer: Medicare Other | Admitting: Podiatry

## 2017-11-16 DIAGNOSIS — D509 Iron deficiency anemia, unspecified: Secondary | ICD-10-CM | POA: Diagnosis not present

## 2017-11-16 DIAGNOSIS — N186 End stage renal disease: Secondary | ICD-10-CM | POA: Diagnosis not present

## 2017-11-16 DIAGNOSIS — E1129 Type 2 diabetes mellitus with other diabetic kidney complication: Secondary | ICD-10-CM | POA: Diagnosis not present

## 2017-11-16 DIAGNOSIS — D631 Anemia in chronic kidney disease: Secondary | ICD-10-CM | POA: Diagnosis not present

## 2017-11-16 DIAGNOSIS — N2581 Secondary hyperparathyroidism of renal origin: Secondary | ICD-10-CM | POA: Diagnosis not present

## 2017-11-18 DIAGNOSIS — N2581 Secondary hyperparathyroidism of renal origin: Secondary | ICD-10-CM | POA: Diagnosis not present

## 2017-11-18 DIAGNOSIS — D509 Iron deficiency anemia, unspecified: Secondary | ICD-10-CM | POA: Diagnosis not present

## 2017-11-18 DIAGNOSIS — E1129 Type 2 diabetes mellitus with other diabetic kidney complication: Secondary | ICD-10-CM | POA: Diagnosis not present

## 2017-11-18 DIAGNOSIS — D631 Anemia in chronic kidney disease: Secondary | ICD-10-CM | POA: Diagnosis not present

## 2017-11-18 DIAGNOSIS — N186 End stage renal disease: Secondary | ICD-10-CM | POA: Diagnosis not present

## 2017-11-21 DIAGNOSIS — N186 End stage renal disease: Secondary | ICD-10-CM | POA: Diagnosis not present

## 2017-11-21 DIAGNOSIS — D631 Anemia in chronic kidney disease: Secondary | ICD-10-CM | POA: Diagnosis not present

## 2017-11-21 DIAGNOSIS — N2581 Secondary hyperparathyroidism of renal origin: Secondary | ICD-10-CM | POA: Diagnosis not present

## 2017-11-21 DIAGNOSIS — D509 Iron deficiency anemia, unspecified: Secondary | ICD-10-CM | POA: Diagnosis not present

## 2017-11-21 DIAGNOSIS — E1129 Type 2 diabetes mellitus with other diabetic kidney complication: Secondary | ICD-10-CM | POA: Diagnosis not present

## 2017-11-23 ENCOUNTER — Other Ambulatory Visit: Payer: Self-pay | Admitting: Infectious Disease

## 2017-11-23 DIAGNOSIS — D631 Anemia in chronic kidney disease: Secondary | ICD-10-CM | POA: Diagnosis not present

## 2017-11-23 DIAGNOSIS — E1129 Type 2 diabetes mellitus with other diabetic kidney complication: Secondary | ICD-10-CM | POA: Diagnosis not present

## 2017-11-23 DIAGNOSIS — L732 Hidradenitis suppurativa: Secondary | ICD-10-CM

## 2017-11-23 DIAGNOSIS — N2581 Secondary hyperparathyroidism of renal origin: Secondary | ICD-10-CM | POA: Diagnosis not present

## 2017-11-23 DIAGNOSIS — D509 Iron deficiency anemia, unspecified: Secondary | ICD-10-CM | POA: Diagnosis not present

## 2017-11-23 DIAGNOSIS — N186 End stage renal disease: Secondary | ICD-10-CM | POA: Diagnosis not present

## 2017-11-25 DIAGNOSIS — E1129 Type 2 diabetes mellitus with other diabetic kidney complication: Secondary | ICD-10-CM | POA: Diagnosis not present

## 2017-11-25 DIAGNOSIS — D509 Iron deficiency anemia, unspecified: Secondary | ICD-10-CM | POA: Diagnosis not present

## 2017-11-25 DIAGNOSIS — N2581 Secondary hyperparathyroidism of renal origin: Secondary | ICD-10-CM | POA: Diagnosis not present

## 2017-11-25 DIAGNOSIS — D631 Anemia in chronic kidney disease: Secondary | ICD-10-CM | POA: Diagnosis not present

## 2017-11-25 DIAGNOSIS — N186 End stage renal disease: Secondary | ICD-10-CM | POA: Diagnosis not present

## 2017-11-26 DIAGNOSIS — E1129 Type 2 diabetes mellitus with other diabetic kidney complication: Secondary | ICD-10-CM | POA: Diagnosis not present

## 2017-11-26 DIAGNOSIS — Z992 Dependence on renal dialysis: Secondary | ICD-10-CM | POA: Diagnosis not present

## 2017-11-26 DIAGNOSIS — N186 End stage renal disease: Secondary | ICD-10-CM | POA: Diagnosis not present

## 2017-11-28 ENCOUNTER — Telehealth: Payer: Self-pay | Admitting: *Deleted

## 2017-11-28 ENCOUNTER — Other Ambulatory Visit: Payer: Self-pay | Admitting: *Deleted

## 2017-11-28 DIAGNOSIS — D509 Iron deficiency anemia, unspecified: Secondary | ICD-10-CM | POA: Diagnosis not present

## 2017-11-28 DIAGNOSIS — N2581 Secondary hyperparathyroidism of renal origin: Secondary | ICD-10-CM | POA: Diagnosis not present

## 2017-11-28 DIAGNOSIS — N186 End stage renal disease: Secondary | ICD-10-CM | POA: Diagnosis not present

## 2017-11-28 DIAGNOSIS — E1129 Type 2 diabetes mellitus with other diabetic kidney complication: Secondary | ICD-10-CM | POA: Diagnosis not present

## 2017-11-28 DIAGNOSIS — D631 Anemia in chronic kidney disease: Secondary | ICD-10-CM | POA: Diagnosis not present

## 2017-11-28 DIAGNOSIS — L732 Hidradenitis suppurativa: Secondary | ICD-10-CM

## 2017-11-28 MED ORDER — METRONIDAZOLE 500 MG PO TABS: 500.0000 mg | ORAL_TABLET | Freq: Three times a day (TID) | ORAL | 2 refills | Status: DC

## 2017-11-28 MED ORDER — CEPHALEXIN 500 MG PO CAPS: 500.0000 mg | ORAL_CAPSULE | Freq: Two times a day (BID) | ORAL | 2 refills | Status: DC

## 2017-11-28 NOTE — Telephone Encounter (Signed)
Call from patient's Mom stating patient is having another breakout and they are requesting refills on the oral antibiotics, keflex and flagyl. Per Dr. Tommy Medal ok to refill these meds and antibiotics sent.

## 2017-11-30 DIAGNOSIS — N186 End stage renal disease: Secondary | ICD-10-CM | POA: Diagnosis not present

## 2017-11-30 DIAGNOSIS — E1129 Type 2 diabetes mellitus with other diabetic kidney complication: Secondary | ICD-10-CM | POA: Diagnosis not present

## 2017-11-30 DIAGNOSIS — N2581 Secondary hyperparathyroidism of renal origin: Secondary | ICD-10-CM | POA: Diagnosis not present

## 2017-11-30 DIAGNOSIS — D509 Iron deficiency anemia, unspecified: Secondary | ICD-10-CM | POA: Diagnosis not present

## 2017-11-30 DIAGNOSIS — D631 Anemia in chronic kidney disease: Secondary | ICD-10-CM | POA: Diagnosis not present

## 2017-12-03 DIAGNOSIS — N2581 Secondary hyperparathyroidism of renal origin: Secondary | ICD-10-CM | POA: Diagnosis not present

## 2017-12-03 DIAGNOSIS — E1129 Type 2 diabetes mellitus with other diabetic kidney complication: Secondary | ICD-10-CM | POA: Diagnosis not present

## 2017-12-03 DIAGNOSIS — D631 Anemia in chronic kidney disease: Secondary | ICD-10-CM | POA: Diagnosis not present

## 2017-12-03 DIAGNOSIS — D509 Iron deficiency anemia, unspecified: Secondary | ICD-10-CM | POA: Diagnosis not present

## 2017-12-03 DIAGNOSIS — N186 End stage renal disease: Secondary | ICD-10-CM | POA: Diagnosis not present

## 2017-12-04 ENCOUNTER — Ambulatory Visit: Payer: Medicare Other | Admitting: Podiatry

## 2017-12-05 DIAGNOSIS — N186 End stage renal disease: Secondary | ICD-10-CM | POA: Diagnosis not present

## 2017-12-05 DIAGNOSIS — D509 Iron deficiency anemia, unspecified: Secondary | ICD-10-CM | POA: Diagnosis not present

## 2017-12-05 DIAGNOSIS — N2581 Secondary hyperparathyroidism of renal origin: Secondary | ICD-10-CM | POA: Diagnosis not present

## 2017-12-05 DIAGNOSIS — D631 Anemia in chronic kidney disease: Secondary | ICD-10-CM | POA: Diagnosis not present

## 2017-12-05 DIAGNOSIS — E1129 Type 2 diabetes mellitus with other diabetic kidney complication: Secondary | ICD-10-CM | POA: Diagnosis not present

## 2017-12-07 DIAGNOSIS — D509 Iron deficiency anemia, unspecified: Secondary | ICD-10-CM | POA: Diagnosis not present

## 2017-12-07 DIAGNOSIS — D631 Anemia in chronic kidney disease: Secondary | ICD-10-CM | POA: Diagnosis not present

## 2017-12-07 DIAGNOSIS — N2581 Secondary hyperparathyroidism of renal origin: Secondary | ICD-10-CM | POA: Diagnosis not present

## 2017-12-07 DIAGNOSIS — E1129 Type 2 diabetes mellitus with other diabetic kidney complication: Secondary | ICD-10-CM | POA: Diagnosis not present

## 2017-12-07 DIAGNOSIS — N186 End stage renal disease: Secondary | ICD-10-CM | POA: Diagnosis not present

## 2017-12-10 DIAGNOSIS — D631 Anemia in chronic kidney disease: Secondary | ICD-10-CM | POA: Diagnosis not present

## 2017-12-10 DIAGNOSIS — D509 Iron deficiency anemia, unspecified: Secondary | ICD-10-CM | POA: Diagnosis not present

## 2017-12-10 DIAGNOSIS — N186 End stage renal disease: Secondary | ICD-10-CM | POA: Diagnosis not present

## 2017-12-10 DIAGNOSIS — E1129 Type 2 diabetes mellitus with other diabetic kidney complication: Secondary | ICD-10-CM | POA: Diagnosis not present

## 2017-12-10 DIAGNOSIS — N2581 Secondary hyperparathyroidism of renal origin: Secondary | ICD-10-CM | POA: Diagnosis not present

## 2017-12-11 DIAGNOSIS — H903 Sensorineural hearing loss, bilateral: Secondary | ICD-10-CM | POA: Diagnosis not present

## 2017-12-12 DIAGNOSIS — D509 Iron deficiency anemia, unspecified: Secondary | ICD-10-CM | POA: Diagnosis not present

## 2017-12-12 DIAGNOSIS — N2581 Secondary hyperparathyroidism of renal origin: Secondary | ICD-10-CM | POA: Diagnosis not present

## 2017-12-12 DIAGNOSIS — D631 Anemia in chronic kidney disease: Secondary | ICD-10-CM | POA: Diagnosis not present

## 2017-12-12 DIAGNOSIS — N186 End stage renal disease: Secondary | ICD-10-CM | POA: Diagnosis not present

## 2017-12-12 DIAGNOSIS — E1129 Type 2 diabetes mellitus with other diabetic kidney complication: Secondary | ICD-10-CM | POA: Diagnosis not present

## 2017-12-14 DIAGNOSIS — N2581 Secondary hyperparathyroidism of renal origin: Secondary | ICD-10-CM | POA: Diagnosis not present

## 2017-12-14 DIAGNOSIS — N186 End stage renal disease: Secondary | ICD-10-CM | POA: Diagnosis not present

## 2017-12-14 DIAGNOSIS — D631 Anemia in chronic kidney disease: Secondary | ICD-10-CM | POA: Diagnosis not present

## 2017-12-14 DIAGNOSIS — E1129 Type 2 diabetes mellitus with other diabetic kidney complication: Secondary | ICD-10-CM | POA: Diagnosis not present

## 2017-12-14 DIAGNOSIS — D509 Iron deficiency anemia, unspecified: Secondary | ICD-10-CM | POA: Diagnosis not present

## 2017-12-17 DIAGNOSIS — N186 End stage renal disease: Secondary | ICD-10-CM | POA: Diagnosis not present

## 2017-12-17 DIAGNOSIS — N2581 Secondary hyperparathyroidism of renal origin: Secondary | ICD-10-CM | POA: Diagnosis not present

## 2017-12-17 DIAGNOSIS — D509 Iron deficiency anemia, unspecified: Secondary | ICD-10-CM | POA: Diagnosis not present

## 2017-12-17 DIAGNOSIS — E1129 Type 2 diabetes mellitus with other diabetic kidney complication: Secondary | ICD-10-CM | POA: Diagnosis not present

## 2017-12-17 DIAGNOSIS — D631 Anemia in chronic kidney disease: Secondary | ICD-10-CM | POA: Diagnosis not present

## 2017-12-19 DIAGNOSIS — D509 Iron deficiency anemia, unspecified: Secondary | ICD-10-CM | POA: Diagnosis not present

## 2017-12-19 DIAGNOSIS — N186 End stage renal disease: Secondary | ICD-10-CM | POA: Diagnosis not present

## 2017-12-19 DIAGNOSIS — D631 Anemia in chronic kidney disease: Secondary | ICD-10-CM | POA: Diagnosis not present

## 2017-12-19 DIAGNOSIS — E1129 Type 2 diabetes mellitus with other diabetic kidney complication: Secondary | ICD-10-CM | POA: Diagnosis not present

## 2017-12-19 DIAGNOSIS — N2581 Secondary hyperparathyroidism of renal origin: Secondary | ICD-10-CM | POA: Diagnosis not present

## 2017-12-20 ENCOUNTER — Ambulatory Visit: Payer: Medicare Other | Admitting: Podiatry

## 2017-12-21 DIAGNOSIS — N2581 Secondary hyperparathyroidism of renal origin: Secondary | ICD-10-CM | POA: Diagnosis not present

## 2017-12-21 DIAGNOSIS — N186 End stage renal disease: Secondary | ICD-10-CM | POA: Diagnosis not present

## 2017-12-21 DIAGNOSIS — E1129 Type 2 diabetes mellitus with other diabetic kidney complication: Secondary | ICD-10-CM | POA: Diagnosis not present

## 2017-12-21 DIAGNOSIS — D631 Anemia in chronic kidney disease: Secondary | ICD-10-CM | POA: Diagnosis not present

## 2017-12-21 DIAGNOSIS — D509 Iron deficiency anemia, unspecified: Secondary | ICD-10-CM | POA: Diagnosis not present

## 2017-12-24 DIAGNOSIS — E1129 Type 2 diabetes mellitus with other diabetic kidney complication: Secondary | ICD-10-CM | POA: Diagnosis not present

## 2017-12-24 DIAGNOSIS — D631 Anemia in chronic kidney disease: Secondary | ICD-10-CM | POA: Diagnosis not present

## 2017-12-24 DIAGNOSIS — D509 Iron deficiency anemia, unspecified: Secondary | ICD-10-CM | POA: Diagnosis not present

## 2017-12-24 DIAGNOSIS — N2581 Secondary hyperparathyroidism of renal origin: Secondary | ICD-10-CM | POA: Diagnosis not present

## 2017-12-24 DIAGNOSIS — N186 End stage renal disease: Secondary | ICD-10-CM | POA: Diagnosis not present

## 2017-12-26 DIAGNOSIS — N186 End stage renal disease: Secondary | ICD-10-CM | POA: Diagnosis not present

## 2017-12-26 DIAGNOSIS — D509 Iron deficiency anemia, unspecified: Secondary | ICD-10-CM | POA: Diagnosis not present

## 2017-12-26 DIAGNOSIS — N2581 Secondary hyperparathyroidism of renal origin: Secondary | ICD-10-CM | POA: Diagnosis not present

## 2017-12-26 DIAGNOSIS — E1129 Type 2 diabetes mellitus with other diabetic kidney complication: Secondary | ICD-10-CM | POA: Diagnosis not present

## 2017-12-26 DIAGNOSIS — D631 Anemia in chronic kidney disease: Secondary | ICD-10-CM | POA: Diagnosis not present

## 2017-12-27 DIAGNOSIS — N186 End stage renal disease: Secondary | ICD-10-CM | POA: Diagnosis not present

## 2017-12-27 DIAGNOSIS — E1129 Type 2 diabetes mellitus with other diabetic kidney complication: Secondary | ICD-10-CM | POA: Diagnosis not present

## 2017-12-27 DIAGNOSIS — Z992 Dependence on renal dialysis: Secondary | ICD-10-CM | POA: Diagnosis not present

## 2017-12-28 DIAGNOSIS — E1129 Type 2 diabetes mellitus with other diabetic kidney complication: Secondary | ICD-10-CM | POA: Diagnosis not present

## 2017-12-28 DIAGNOSIS — N186 End stage renal disease: Secondary | ICD-10-CM | POA: Diagnosis not present

## 2017-12-28 DIAGNOSIS — D631 Anemia in chronic kidney disease: Secondary | ICD-10-CM | POA: Diagnosis not present

## 2017-12-28 DIAGNOSIS — Z992 Dependence on renal dialysis: Secondary | ICD-10-CM | POA: Diagnosis not present

## 2017-12-28 DIAGNOSIS — D509 Iron deficiency anemia, unspecified: Secondary | ICD-10-CM | POA: Diagnosis not present

## 2017-12-28 DIAGNOSIS — N2581 Secondary hyperparathyroidism of renal origin: Secondary | ICD-10-CM | POA: Diagnosis not present

## 2017-12-31 ENCOUNTER — Other Ambulatory Visit: Payer: Self-pay | Admitting: Family Medicine

## 2017-12-31 DIAGNOSIS — N2581 Secondary hyperparathyroidism of renal origin: Secondary | ICD-10-CM | POA: Diagnosis not present

## 2017-12-31 DIAGNOSIS — N186 End stage renal disease: Secondary | ICD-10-CM | POA: Diagnosis not present

## 2017-12-31 DIAGNOSIS — D509 Iron deficiency anemia, unspecified: Secondary | ICD-10-CM | POA: Diagnosis not present

## 2017-12-31 DIAGNOSIS — Z139 Encounter for screening, unspecified: Secondary | ICD-10-CM

## 2017-12-31 DIAGNOSIS — E1129 Type 2 diabetes mellitus with other diabetic kidney complication: Secondary | ICD-10-CM | POA: Diagnosis not present

## 2017-12-31 DIAGNOSIS — D631 Anemia in chronic kidney disease: Secondary | ICD-10-CM | POA: Diagnosis not present

## 2018-01-02 DIAGNOSIS — D631 Anemia in chronic kidney disease: Secondary | ICD-10-CM | POA: Diagnosis not present

## 2018-01-02 DIAGNOSIS — N2581 Secondary hyperparathyroidism of renal origin: Secondary | ICD-10-CM | POA: Diagnosis not present

## 2018-01-02 DIAGNOSIS — D509 Iron deficiency anemia, unspecified: Secondary | ICD-10-CM | POA: Diagnosis not present

## 2018-01-02 DIAGNOSIS — N186 End stage renal disease: Secondary | ICD-10-CM | POA: Diagnosis not present

## 2018-01-02 DIAGNOSIS — E1129 Type 2 diabetes mellitus with other diabetic kidney complication: Secondary | ICD-10-CM | POA: Diagnosis not present

## 2018-01-04 DIAGNOSIS — D631 Anemia in chronic kidney disease: Secondary | ICD-10-CM | POA: Diagnosis not present

## 2018-01-04 DIAGNOSIS — E1129 Type 2 diabetes mellitus with other diabetic kidney complication: Secondary | ICD-10-CM | POA: Diagnosis not present

## 2018-01-04 DIAGNOSIS — N186 End stage renal disease: Secondary | ICD-10-CM | POA: Diagnosis not present

## 2018-01-04 DIAGNOSIS — D509 Iron deficiency anemia, unspecified: Secondary | ICD-10-CM | POA: Diagnosis not present

## 2018-01-04 DIAGNOSIS — N2581 Secondary hyperparathyroidism of renal origin: Secondary | ICD-10-CM | POA: Diagnosis not present

## 2018-01-07 DIAGNOSIS — E1129 Type 2 diabetes mellitus with other diabetic kidney complication: Secondary | ICD-10-CM | POA: Diagnosis not present

## 2018-01-07 DIAGNOSIS — N186 End stage renal disease: Secondary | ICD-10-CM | POA: Diagnosis not present

## 2018-01-07 DIAGNOSIS — D631 Anemia in chronic kidney disease: Secondary | ICD-10-CM | POA: Diagnosis not present

## 2018-01-07 DIAGNOSIS — D509 Iron deficiency anemia, unspecified: Secondary | ICD-10-CM | POA: Diagnosis not present

## 2018-01-07 DIAGNOSIS — N2581 Secondary hyperparathyroidism of renal origin: Secondary | ICD-10-CM | POA: Diagnosis not present

## 2018-01-09 DIAGNOSIS — N2581 Secondary hyperparathyroidism of renal origin: Secondary | ICD-10-CM | POA: Diagnosis not present

## 2018-01-09 DIAGNOSIS — E1129 Type 2 diabetes mellitus with other diabetic kidney complication: Secondary | ICD-10-CM | POA: Diagnosis not present

## 2018-01-09 DIAGNOSIS — D509 Iron deficiency anemia, unspecified: Secondary | ICD-10-CM | POA: Diagnosis not present

## 2018-01-09 DIAGNOSIS — D631 Anemia in chronic kidney disease: Secondary | ICD-10-CM | POA: Diagnosis not present

## 2018-01-09 DIAGNOSIS — N186 End stage renal disease: Secondary | ICD-10-CM | POA: Diagnosis not present

## 2018-01-11 DIAGNOSIS — E1129 Type 2 diabetes mellitus with other diabetic kidney complication: Secondary | ICD-10-CM | POA: Diagnosis not present

## 2018-01-11 DIAGNOSIS — D509 Iron deficiency anemia, unspecified: Secondary | ICD-10-CM | POA: Diagnosis not present

## 2018-01-11 DIAGNOSIS — N2581 Secondary hyperparathyroidism of renal origin: Secondary | ICD-10-CM | POA: Diagnosis not present

## 2018-01-11 DIAGNOSIS — N186 End stage renal disease: Secondary | ICD-10-CM | POA: Diagnosis not present

## 2018-01-11 DIAGNOSIS — D631 Anemia in chronic kidney disease: Secondary | ICD-10-CM | POA: Diagnosis not present

## 2018-01-14 DIAGNOSIS — N2581 Secondary hyperparathyroidism of renal origin: Secondary | ICD-10-CM | POA: Diagnosis not present

## 2018-01-14 DIAGNOSIS — D509 Iron deficiency anemia, unspecified: Secondary | ICD-10-CM | POA: Diagnosis not present

## 2018-01-14 DIAGNOSIS — E1129 Type 2 diabetes mellitus with other diabetic kidney complication: Secondary | ICD-10-CM | POA: Diagnosis not present

## 2018-01-14 DIAGNOSIS — D631 Anemia in chronic kidney disease: Secondary | ICD-10-CM | POA: Diagnosis not present

## 2018-01-14 DIAGNOSIS — N186 End stage renal disease: Secondary | ICD-10-CM | POA: Diagnosis not present

## 2018-01-16 DIAGNOSIS — D631 Anemia in chronic kidney disease: Secondary | ICD-10-CM | POA: Diagnosis not present

## 2018-01-16 DIAGNOSIS — N2581 Secondary hyperparathyroidism of renal origin: Secondary | ICD-10-CM | POA: Diagnosis not present

## 2018-01-16 DIAGNOSIS — E1129 Type 2 diabetes mellitus with other diabetic kidney complication: Secondary | ICD-10-CM | POA: Diagnosis not present

## 2018-01-16 DIAGNOSIS — D509 Iron deficiency anemia, unspecified: Secondary | ICD-10-CM | POA: Diagnosis not present

## 2018-01-16 DIAGNOSIS — N186 End stage renal disease: Secondary | ICD-10-CM | POA: Diagnosis not present

## 2018-01-17 ENCOUNTER — Encounter: Payer: Self-pay | Admitting: Podiatry

## 2018-01-17 ENCOUNTER — Ambulatory Visit (INDEPENDENT_AMBULATORY_CARE_PROVIDER_SITE_OTHER): Payer: Medicare Other | Admitting: Podiatry

## 2018-01-17 DIAGNOSIS — B351 Tinea unguium: Secondary | ICD-10-CM

## 2018-01-17 DIAGNOSIS — Q828 Other specified congenital malformations of skin: Secondary | ICD-10-CM

## 2018-01-17 DIAGNOSIS — M79675 Pain in left toe(s): Secondary | ICD-10-CM

## 2018-01-17 DIAGNOSIS — E1149 Type 2 diabetes mellitus with other diabetic neurological complication: Secondary | ICD-10-CM | POA: Diagnosis not present

## 2018-01-17 DIAGNOSIS — M79674 Pain in right toe(s): Secondary | ICD-10-CM

## 2018-01-18 DIAGNOSIS — N2581 Secondary hyperparathyroidism of renal origin: Secondary | ICD-10-CM | POA: Diagnosis not present

## 2018-01-18 DIAGNOSIS — D509 Iron deficiency anemia, unspecified: Secondary | ICD-10-CM | POA: Diagnosis not present

## 2018-01-18 DIAGNOSIS — D631 Anemia in chronic kidney disease: Secondary | ICD-10-CM | POA: Diagnosis not present

## 2018-01-18 DIAGNOSIS — N186 End stage renal disease: Secondary | ICD-10-CM | POA: Diagnosis not present

## 2018-01-18 DIAGNOSIS — E1129 Type 2 diabetes mellitus with other diabetic kidney complication: Secondary | ICD-10-CM | POA: Diagnosis not present

## 2018-01-20 NOTE — Progress Notes (Signed)
Subjective: 57 y.o. returns the office today for painful, elongated, thickened toenails which she cannot trim herself as well as for calluses to her heels. Denies any redness or drainage around the nails. She has calluses to both of her heels was to cause discomfort at times. Denies any acute changes since last appointment and no new complaints today. Denies any systemic complaints such as fevers, chills, nausea, vomiting.   Objective: AAO 3, NAD- uses oxygen  DP/PT pulses palpable, CRT less than 3 seconds Protective sensation decreased with Simms Weinstein monofilament Nails hypertrophic, dystrophic, elongated, brittle, discolored 10. There is tenderness overlying the nails 1-5 bilaterally. There is no surrounding erythema or drainage along the nail sites. Hyperkeratotic lesions to bilateral plantar heels. Upon debridement there is no underlying ulceration, drainage or any signs of infection. No open lesions or pre-ulcerative lesions are identified. No other areas of tenderness bilateral lower extremities. No overlying edema, erythema, increased warmth. No pain with calf compression, swelling, warmth, erythema. No acute changes.   Assessment: Patient presents with symptomatic onychomycosis, calluses 2  Plan: -Treatment options including alternatives, risks, complications were discussed -Nails sharply debrided 10 without complication/bleeding. -Hyperkeratotic lesion sharply debrided 2 without complications or bleeding. -Discussed daily foot inspection. If there are any changes, to call the office immediately.  -Follow-up in 3 months or sooner if any problems are to arise. In the meantime, encouraged to call the office with any questions, concerns, changes symptoms.  Celesta Gentile, DPM

## 2018-01-21 DIAGNOSIS — E1129 Type 2 diabetes mellitus with other diabetic kidney complication: Secondary | ICD-10-CM | POA: Diagnosis not present

## 2018-01-21 DIAGNOSIS — N2581 Secondary hyperparathyroidism of renal origin: Secondary | ICD-10-CM | POA: Diagnosis not present

## 2018-01-21 DIAGNOSIS — D509 Iron deficiency anemia, unspecified: Secondary | ICD-10-CM | POA: Diagnosis not present

## 2018-01-21 DIAGNOSIS — N186 End stage renal disease: Secondary | ICD-10-CM | POA: Diagnosis not present

## 2018-01-21 DIAGNOSIS — D631 Anemia in chronic kidney disease: Secondary | ICD-10-CM | POA: Diagnosis not present

## 2018-01-22 DIAGNOSIS — I130 Hypertensive heart and chronic kidney disease with heart failure and stage 1 through stage 4 chronic kidney disease, or unspecified chronic kidney disease: Secondary | ICD-10-CM | POA: Diagnosis not present

## 2018-01-22 DIAGNOSIS — K219 Gastro-esophageal reflux disease without esophagitis: Secondary | ICD-10-CM | POA: Diagnosis not present

## 2018-01-22 DIAGNOSIS — I429 Cardiomyopathy, unspecified: Secondary | ICD-10-CM | POA: Diagnosis not present

## 2018-01-22 DIAGNOSIS — E1121 Type 2 diabetes mellitus with diabetic nephropathy: Secondary | ICD-10-CM | POA: Diagnosis not present

## 2018-01-22 DIAGNOSIS — N186 End stage renal disease: Secondary | ICD-10-CM | POA: Diagnosis not present

## 2018-01-22 DIAGNOSIS — L732 Hidradenitis suppurativa: Secondary | ICD-10-CM | POA: Diagnosis not present

## 2018-01-22 DIAGNOSIS — F324 Major depressive disorder, single episode, in partial remission: Secondary | ICD-10-CM | POA: Diagnosis not present

## 2018-01-22 DIAGNOSIS — E785 Hyperlipidemia, unspecified: Secondary | ICD-10-CM | POA: Diagnosis not present

## 2018-01-22 DIAGNOSIS — E039 Hypothyroidism, unspecified: Secondary | ICD-10-CM | POA: Diagnosis not present

## 2018-01-22 DIAGNOSIS — G894 Chronic pain syndrome: Secondary | ICD-10-CM | POA: Diagnosis not present

## 2018-01-22 DIAGNOSIS — Z992 Dependence on renal dialysis: Secondary | ICD-10-CM | POA: Diagnosis not present

## 2018-01-23 DIAGNOSIS — N2581 Secondary hyperparathyroidism of renal origin: Secondary | ICD-10-CM | POA: Diagnosis not present

## 2018-01-23 DIAGNOSIS — D631 Anemia in chronic kidney disease: Secondary | ICD-10-CM | POA: Diagnosis not present

## 2018-01-23 DIAGNOSIS — E1129 Type 2 diabetes mellitus with other diabetic kidney complication: Secondary | ICD-10-CM | POA: Diagnosis not present

## 2018-01-23 DIAGNOSIS — N186 End stage renal disease: Secondary | ICD-10-CM | POA: Diagnosis not present

## 2018-01-23 DIAGNOSIS — D509 Iron deficiency anemia, unspecified: Secondary | ICD-10-CM | POA: Diagnosis not present

## 2018-01-25 DIAGNOSIS — D509 Iron deficiency anemia, unspecified: Secondary | ICD-10-CM | POA: Diagnosis not present

## 2018-01-25 DIAGNOSIS — E1129 Type 2 diabetes mellitus with other diabetic kidney complication: Secondary | ICD-10-CM | POA: Diagnosis not present

## 2018-01-25 DIAGNOSIS — N186 End stage renal disease: Secondary | ICD-10-CM | POA: Diagnosis not present

## 2018-01-25 DIAGNOSIS — Z992 Dependence on renal dialysis: Secondary | ICD-10-CM | POA: Diagnosis not present

## 2018-01-25 DIAGNOSIS — N2581 Secondary hyperparathyroidism of renal origin: Secondary | ICD-10-CM | POA: Diagnosis not present

## 2018-01-25 DIAGNOSIS — D631 Anemia in chronic kidney disease: Secondary | ICD-10-CM | POA: Diagnosis not present

## 2018-01-28 DIAGNOSIS — D631 Anemia in chronic kidney disease: Secondary | ICD-10-CM | POA: Diagnosis not present

## 2018-01-28 DIAGNOSIS — N2581 Secondary hyperparathyroidism of renal origin: Secondary | ICD-10-CM | POA: Diagnosis not present

## 2018-01-28 DIAGNOSIS — D509 Iron deficiency anemia, unspecified: Secondary | ICD-10-CM | POA: Diagnosis not present

## 2018-01-28 DIAGNOSIS — E1129 Type 2 diabetes mellitus with other diabetic kidney complication: Secondary | ICD-10-CM | POA: Diagnosis not present

## 2018-01-28 DIAGNOSIS — N186 End stage renal disease: Secondary | ICD-10-CM | POA: Diagnosis not present

## 2018-01-29 ENCOUNTER — Ambulatory Visit: Payer: Medicare Other

## 2018-01-30 DIAGNOSIS — D631 Anemia in chronic kidney disease: Secondary | ICD-10-CM | POA: Diagnosis not present

## 2018-01-30 DIAGNOSIS — N186 End stage renal disease: Secondary | ICD-10-CM | POA: Diagnosis not present

## 2018-01-30 DIAGNOSIS — E1129 Type 2 diabetes mellitus with other diabetic kidney complication: Secondary | ICD-10-CM | POA: Diagnosis not present

## 2018-01-30 DIAGNOSIS — N2581 Secondary hyperparathyroidism of renal origin: Secondary | ICD-10-CM | POA: Diagnosis not present

## 2018-01-30 DIAGNOSIS — D509 Iron deficiency anemia, unspecified: Secondary | ICD-10-CM | POA: Diagnosis not present

## 2018-01-31 DIAGNOSIS — T82858A Stenosis of vascular prosthetic devices, implants and grafts, initial encounter: Secondary | ICD-10-CM | POA: Diagnosis not present

## 2018-01-31 DIAGNOSIS — N186 End stage renal disease: Secondary | ICD-10-CM | POA: Diagnosis not present

## 2018-01-31 DIAGNOSIS — I871 Compression of vein: Secondary | ICD-10-CM | POA: Diagnosis not present

## 2018-01-31 DIAGNOSIS — Z992 Dependence on renal dialysis: Secondary | ICD-10-CM | POA: Diagnosis not present

## 2018-02-01 DIAGNOSIS — E1129 Type 2 diabetes mellitus with other diabetic kidney complication: Secondary | ICD-10-CM | POA: Diagnosis not present

## 2018-02-01 DIAGNOSIS — D631 Anemia in chronic kidney disease: Secondary | ICD-10-CM | POA: Diagnosis not present

## 2018-02-01 DIAGNOSIS — N2581 Secondary hyperparathyroidism of renal origin: Secondary | ICD-10-CM | POA: Diagnosis not present

## 2018-02-01 DIAGNOSIS — D509 Iron deficiency anemia, unspecified: Secondary | ICD-10-CM | POA: Diagnosis not present

## 2018-02-01 DIAGNOSIS — N186 End stage renal disease: Secondary | ICD-10-CM | POA: Diagnosis not present

## 2018-02-04 ENCOUNTER — Other Ambulatory Visit: Payer: Self-pay | Admitting: Adult Health

## 2018-02-04 DIAGNOSIS — N2581 Secondary hyperparathyroidism of renal origin: Secondary | ICD-10-CM | POA: Diagnosis not present

## 2018-02-04 DIAGNOSIS — E1129 Type 2 diabetes mellitus with other diabetic kidney complication: Secondary | ICD-10-CM | POA: Diagnosis not present

## 2018-02-04 DIAGNOSIS — D509 Iron deficiency anemia, unspecified: Secondary | ICD-10-CM | POA: Diagnosis not present

## 2018-02-04 DIAGNOSIS — N186 End stage renal disease: Secondary | ICD-10-CM | POA: Diagnosis not present

## 2018-02-04 DIAGNOSIS — D631 Anemia in chronic kidney disease: Secondary | ICD-10-CM | POA: Diagnosis not present

## 2018-02-06 DIAGNOSIS — N2581 Secondary hyperparathyroidism of renal origin: Secondary | ICD-10-CM | POA: Diagnosis not present

## 2018-02-06 DIAGNOSIS — E1129 Type 2 diabetes mellitus with other diabetic kidney complication: Secondary | ICD-10-CM | POA: Diagnosis not present

## 2018-02-06 DIAGNOSIS — N186 End stage renal disease: Secondary | ICD-10-CM | POA: Diagnosis not present

## 2018-02-06 DIAGNOSIS — D631 Anemia in chronic kidney disease: Secondary | ICD-10-CM | POA: Diagnosis not present

## 2018-02-06 DIAGNOSIS — D509 Iron deficiency anemia, unspecified: Secondary | ICD-10-CM | POA: Diagnosis not present

## 2018-02-08 DIAGNOSIS — E1129 Type 2 diabetes mellitus with other diabetic kidney complication: Secondary | ICD-10-CM | POA: Diagnosis not present

## 2018-02-08 DIAGNOSIS — N2581 Secondary hyperparathyroidism of renal origin: Secondary | ICD-10-CM | POA: Diagnosis not present

## 2018-02-08 DIAGNOSIS — N186 End stage renal disease: Secondary | ICD-10-CM | POA: Diagnosis not present

## 2018-02-08 DIAGNOSIS — D631 Anemia in chronic kidney disease: Secondary | ICD-10-CM | POA: Diagnosis not present

## 2018-02-08 DIAGNOSIS — D509 Iron deficiency anemia, unspecified: Secondary | ICD-10-CM | POA: Diagnosis not present

## 2018-02-11 DIAGNOSIS — N186 End stage renal disease: Secondary | ICD-10-CM | POA: Diagnosis not present

## 2018-02-11 DIAGNOSIS — E1129 Type 2 diabetes mellitus with other diabetic kidney complication: Secondary | ICD-10-CM | POA: Diagnosis not present

## 2018-02-11 DIAGNOSIS — D631 Anemia in chronic kidney disease: Secondary | ICD-10-CM | POA: Diagnosis not present

## 2018-02-11 DIAGNOSIS — D509 Iron deficiency anemia, unspecified: Secondary | ICD-10-CM | POA: Diagnosis not present

## 2018-02-11 DIAGNOSIS — N2581 Secondary hyperparathyroidism of renal origin: Secondary | ICD-10-CM | POA: Diagnosis not present

## 2018-02-13 DIAGNOSIS — E1129 Type 2 diabetes mellitus with other diabetic kidney complication: Secondary | ICD-10-CM | POA: Diagnosis not present

## 2018-02-13 DIAGNOSIS — D509 Iron deficiency anemia, unspecified: Secondary | ICD-10-CM | POA: Diagnosis not present

## 2018-02-13 DIAGNOSIS — N2581 Secondary hyperparathyroidism of renal origin: Secondary | ICD-10-CM | POA: Diagnosis not present

## 2018-02-13 DIAGNOSIS — N186 End stage renal disease: Secondary | ICD-10-CM | POA: Diagnosis not present

## 2018-02-13 DIAGNOSIS — D631 Anemia in chronic kidney disease: Secondary | ICD-10-CM | POA: Diagnosis not present

## 2018-02-15 DIAGNOSIS — N2581 Secondary hyperparathyroidism of renal origin: Secondary | ICD-10-CM | POA: Diagnosis not present

## 2018-02-15 DIAGNOSIS — E1129 Type 2 diabetes mellitus with other diabetic kidney complication: Secondary | ICD-10-CM | POA: Diagnosis not present

## 2018-02-15 DIAGNOSIS — N186 End stage renal disease: Secondary | ICD-10-CM | POA: Diagnosis not present

## 2018-02-15 DIAGNOSIS — D509 Iron deficiency anemia, unspecified: Secondary | ICD-10-CM | POA: Diagnosis not present

## 2018-02-15 DIAGNOSIS — D631 Anemia in chronic kidney disease: Secondary | ICD-10-CM | POA: Diagnosis not present

## 2018-02-18 DIAGNOSIS — N186 End stage renal disease: Secondary | ICD-10-CM | POA: Diagnosis not present

## 2018-02-18 DIAGNOSIS — D631 Anemia in chronic kidney disease: Secondary | ICD-10-CM | POA: Diagnosis not present

## 2018-02-18 DIAGNOSIS — E1129 Type 2 diabetes mellitus with other diabetic kidney complication: Secondary | ICD-10-CM | POA: Diagnosis not present

## 2018-02-18 DIAGNOSIS — N2581 Secondary hyperparathyroidism of renal origin: Secondary | ICD-10-CM | POA: Diagnosis not present

## 2018-02-18 DIAGNOSIS — D509 Iron deficiency anemia, unspecified: Secondary | ICD-10-CM | POA: Diagnosis not present

## 2018-02-20 DIAGNOSIS — D631 Anemia in chronic kidney disease: Secondary | ICD-10-CM | POA: Diagnosis not present

## 2018-02-20 DIAGNOSIS — D509 Iron deficiency anemia, unspecified: Secondary | ICD-10-CM | POA: Diagnosis not present

## 2018-02-20 DIAGNOSIS — N186 End stage renal disease: Secondary | ICD-10-CM | POA: Diagnosis not present

## 2018-02-20 DIAGNOSIS — N2581 Secondary hyperparathyroidism of renal origin: Secondary | ICD-10-CM | POA: Diagnosis not present

## 2018-02-20 DIAGNOSIS — E1129 Type 2 diabetes mellitus with other diabetic kidney complication: Secondary | ICD-10-CM | POA: Diagnosis not present

## 2018-02-22 DIAGNOSIS — E1129 Type 2 diabetes mellitus with other diabetic kidney complication: Secondary | ICD-10-CM | POA: Diagnosis not present

## 2018-02-22 DIAGNOSIS — N2581 Secondary hyperparathyroidism of renal origin: Secondary | ICD-10-CM | POA: Diagnosis not present

## 2018-02-22 DIAGNOSIS — D631 Anemia in chronic kidney disease: Secondary | ICD-10-CM | POA: Diagnosis not present

## 2018-02-22 DIAGNOSIS — N186 End stage renal disease: Secondary | ICD-10-CM | POA: Diagnosis not present

## 2018-02-22 DIAGNOSIS — D509 Iron deficiency anemia, unspecified: Secondary | ICD-10-CM | POA: Diagnosis not present

## 2018-02-25 DIAGNOSIS — D509 Iron deficiency anemia, unspecified: Secondary | ICD-10-CM | POA: Diagnosis not present

## 2018-02-25 DIAGNOSIS — N186 End stage renal disease: Secondary | ICD-10-CM | POA: Diagnosis not present

## 2018-02-25 DIAGNOSIS — N2581 Secondary hyperparathyroidism of renal origin: Secondary | ICD-10-CM | POA: Diagnosis not present

## 2018-02-25 DIAGNOSIS — E1129 Type 2 diabetes mellitus with other diabetic kidney complication: Secondary | ICD-10-CM | POA: Diagnosis not present

## 2018-02-25 DIAGNOSIS — D631 Anemia in chronic kidney disease: Secondary | ICD-10-CM | POA: Diagnosis not present

## 2018-02-25 DIAGNOSIS — Z992 Dependence on renal dialysis: Secondary | ICD-10-CM | POA: Diagnosis not present

## 2018-02-27 DIAGNOSIS — D631 Anemia in chronic kidney disease: Secondary | ICD-10-CM | POA: Diagnosis not present

## 2018-02-27 DIAGNOSIS — D509 Iron deficiency anemia, unspecified: Secondary | ICD-10-CM | POA: Diagnosis not present

## 2018-02-27 DIAGNOSIS — N186 End stage renal disease: Secondary | ICD-10-CM | POA: Diagnosis not present

## 2018-02-27 DIAGNOSIS — N2581 Secondary hyperparathyroidism of renal origin: Secondary | ICD-10-CM | POA: Diagnosis not present

## 2018-02-27 DIAGNOSIS — E1129 Type 2 diabetes mellitus with other diabetic kidney complication: Secondary | ICD-10-CM | POA: Diagnosis not present

## 2018-02-28 ENCOUNTER — Ambulatory Visit: Payer: Medicare Other

## 2018-03-01 DIAGNOSIS — E1129 Type 2 diabetes mellitus with other diabetic kidney complication: Secondary | ICD-10-CM | POA: Diagnosis not present

## 2018-03-01 DIAGNOSIS — D509 Iron deficiency anemia, unspecified: Secondary | ICD-10-CM | POA: Diagnosis not present

## 2018-03-01 DIAGNOSIS — N186 End stage renal disease: Secondary | ICD-10-CM | POA: Diagnosis not present

## 2018-03-01 DIAGNOSIS — D631 Anemia in chronic kidney disease: Secondary | ICD-10-CM | POA: Diagnosis not present

## 2018-03-01 DIAGNOSIS — N2581 Secondary hyperparathyroidism of renal origin: Secondary | ICD-10-CM | POA: Diagnosis not present

## 2018-03-04 DIAGNOSIS — D509 Iron deficiency anemia, unspecified: Secondary | ICD-10-CM | POA: Diagnosis not present

## 2018-03-04 DIAGNOSIS — E1129 Type 2 diabetes mellitus with other diabetic kidney complication: Secondary | ICD-10-CM | POA: Diagnosis not present

## 2018-03-04 DIAGNOSIS — N2581 Secondary hyperparathyroidism of renal origin: Secondary | ICD-10-CM | POA: Diagnosis not present

## 2018-03-04 DIAGNOSIS — N186 End stage renal disease: Secondary | ICD-10-CM | POA: Diagnosis not present

## 2018-03-04 DIAGNOSIS — D631 Anemia in chronic kidney disease: Secondary | ICD-10-CM | POA: Diagnosis not present

## 2018-03-06 DIAGNOSIS — D631 Anemia in chronic kidney disease: Secondary | ICD-10-CM | POA: Diagnosis not present

## 2018-03-06 DIAGNOSIS — D509 Iron deficiency anemia, unspecified: Secondary | ICD-10-CM | POA: Diagnosis not present

## 2018-03-06 DIAGNOSIS — E1129 Type 2 diabetes mellitus with other diabetic kidney complication: Secondary | ICD-10-CM | POA: Diagnosis not present

## 2018-03-06 DIAGNOSIS — N2581 Secondary hyperparathyroidism of renal origin: Secondary | ICD-10-CM | POA: Diagnosis not present

## 2018-03-06 DIAGNOSIS — E063 Autoimmune thyroiditis: Secondary | ICD-10-CM | POA: Diagnosis not present

## 2018-03-06 DIAGNOSIS — N186 End stage renal disease: Secondary | ICD-10-CM | POA: Diagnosis not present

## 2018-03-08 DIAGNOSIS — N186 End stage renal disease: Secondary | ICD-10-CM | POA: Diagnosis not present

## 2018-03-08 DIAGNOSIS — D631 Anemia in chronic kidney disease: Secondary | ICD-10-CM | POA: Diagnosis not present

## 2018-03-08 DIAGNOSIS — E1129 Type 2 diabetes mellitus with other diabetic kidney complication: Secondary | ICD-10-CM | POA: Diagnosis not present

## 2018-03-08 DIAGNOSIS — N2581 Secondary hyperparathyroidism of renal origin: Secondary | ICD-10-CM | POA: Diagnosis not present

## 2018-03-08 DIAGNOSIS — D509 Iron deficiency anemia, unspecified: Secondary | ICD-10-CM | POA: Diagnosis not present

## 2018-03-11 DIAGNOSIS — N2581 Secondary hyperparathyroidism of renal origin: Secondary | ICD-10-CM | POA: Diagnosis not present

## 2018-03-11 DIAGNOSIS — E1129 Type 2 diabetes mellitus with other diabetic kidney complication: Secondary | ICD-10-CM | POA: Diagnosis not present

## 2018-03-11 DIAGNOSIS — N186 End stage renal disease: Secondary | ICD-10-CM | POA: Diagnosis not present

## 2018-03-11 DIAGNOSIS — D509 Iron deficiency anemia, unspecified: Secondary | ICD-10-CM | POA: Diagnosis not present

## 2018-03-11 DIAGNOSIS — D631 Anemia in chronic kidney disease: Secondary | ICD-10-CM | POA: Diagnosis not present

## 2018-03-12 ENCOUNTER — Ambulatory Visit: Payer: Medicare Other | Admitting: Infectious Disease

## 2018-03-13 DIAGNOSIS — D631 Anemia in chronic kidney disease: Secondary | ICD-10-CM | POA: Diagnosis not present

## 2018-03-13 DIAGNOSIS — D509 Iron deficiency anemia, unspecified: Secondary | ICD-10-CM | POA: Diagnosis not present

## 2018-03-13 DIAGNOSIS — N186 End stage renal disease: Secondary | ICD-10-CM | POA: Diagnosis not present

## 2018-03-13 DIAGNOSIS — E1129 Type 2 diabetes mellitus with other diabetic kidney complication: Secondary | ICD-10-CM | POA: Diagnosis not present

## 2018-03-13 DIAGNOSIS — N2581 Secondary hyperparathyroidism of renal origin: Secondary | ICD-10-CM | POA: Diagnosis not present

## 2018-03-15 DIAGNOSIS — D631 Anemia in chronic kidney disease: Secondary | ICD-10-CM | POA: Diagnosis not present

## 2018-03-15 DIAGNOSIS — E1129 Type 2 diabetes mellitus with other diabetic kidney complication: Secondary | ICD-10-CM | POA: Diagnosis not present

## 2018-03-15 DIAGNOSIS — N186 End stage renal disease: Secondary | ICD-10-CM | POA: Diagnosis not present

## 2018-03-15 DIAGNOSIS — N2581 Secondary hyperparathyroidism of renal origin: Secondary | ICD-10-CM | POA: Diagnosis not present

## 2018-03-15 DIAGNOSIS — D509 Iron deficiency anemia, unspecified: Secondary | ICD-10-CM | POA: Diagnosis not present

## 2018-03-18 DIAGNOSIS — E1129 Type 2 diabetes mellitus with other diabetic kidney complication: Secondary | ICD-10-CM | POA: Diagnosis not present

## 2018-03-18 DIAGNOSIS — D631 Anemia in chronic kidney disease: Secondary | ICD-10-CM | POA: Diagnosis not present

## 2018-03-18 DIAGNOSIS — N2581 Secondary hyperparathyroidism of renal origin: Secondary | ICD-10-CM | POA: Diagnosis not present

## 2018-03-18 DIAGNOSIS — N186 End stage renal disease: Secondary | ICD-10-CM | POA: Diagnosis not present

## 2018-03-18 DIAGNOSIS — D509 Iron deficiency anemia, unspecified: Secondary | ICD-10-CM | POA: Diagnosis not present

## 2018-03-20 DIAGNOSIS — D631 Anemia in chronic kidney disease: Secondary | ICD-10-CM | POA: Diagnosis not present

## 2018-03-20 DIAGNOSIS — D509 Iron deficiency anemia, unspecified: Secondary | ICD-10-CM | POA: Diagnosis not present

## 2018-03-20 DIAGNOSIS — E1129 Type 2 diabetes mellitus with other diabetic kidney complication: Secondary | ICD-10-CM | POA: Diagnosis not present

## 2018-03-20 DIAGNOSIS — N2581 Secondary hyperparathyroidism of renal origin: Secondary | ICD-10-CM | POA: Diagnosis not present

## 2018-03-20 DIAGNOSIS — N186 End stage renal disease: Secondary | ICD-10-CM | POA: Diagnosis not present

## 2018-03-22 DIAGNOSIS — N2581 Secondary hyperparathyroidism of renal origin: Secondary | ICD-10-CM | POA: Diagnosis not present

## 2018-03-22 DIAGNOSIS — D509 Iron deficiency anemia, unspecified: Secondary | ICD-10-CM | POA: Diagnosis not present

## 2018-03-22 DIAGNOSIS — E1129 Type 2 diabetes mellitus with other diabetic kidney complication: Secondary | ICD-10-CM | POA: Diagnosis not present

## 2018-03-22 DIAGNOSIS — N186 End stage renal disease: Secondary | ICD-10-CM | POA: Diagnosis not present

## 2018-03-22 DIAGNOSIS — D631 Anemia in chronic kidney disease: Secondary | ICD-10-CM | POA: Diagnosis not present

## 2018-03-25 DIAGNOSIS — D509 Iron deficiency anemia, unspecified: Secondary | ICD-10-CM | POA: Diagnosis not present

## 2018-03-25 DIAGNOSIS — E1129 Type 2 diabetes mellitus with other diabetic kidney complication: Secondary | ICD-10-CM | POA: Diagnosis not present

## 2018-03-25 DIAGNOSIS — D631 Anemia in chronic kidney disease: Secondary | ICD-10-CM | POA: Diagnosis not present

## 2018-03-25 DIAGNOSIS — N2581 Secondary hyperparathyroidism of renal origin: Secondary | ICD-10-CM | POA: Diagnosis not present

## 2018-03-25 DIAGNOSIS — N186 End stage renal disease: Secondary | ICD-10-CM | POA: Diagnosis not present

## 2018-03-27 ENCOUNTER — Other Ambulatory Visit: Payer: Self-pay | Admitting: Infectious Disease

## 2018-03-27 DIAGNOSIS — D509 Iron deficiency anemia, unspecified: Secondary | ICD-10-CM | POA: Diagnosis not present

## 2018-03-27 DIAGNOSIS — N2581 Secondary hyperparathyroidism of renal origin: Secondary | ICD-10-CM | POA: Diagnosis not present

## 2018-03-27 DIAGNOSIS — D631 Anemia in chronic kidney disease: Secondary | ICD-10-CM | POA: Diagnosis not present

## 2018-03-27 DIAGNOSIS — Z992 Dependence on renal dialysis: Secondary | ICD-10-CM | POA: Diagnosis not present

## 2018-03-27 DIAGNOSIS — N186 End stage renal disease: Secondary | ICD-10-CM | POA: Diagnosis not present

## 2018-03-27 DIAGNOSIS — E1129 Type 2 diabetes mellitus with other diabetic kidney complication: Secondary | ICD-10-CM | POA: Diagnosis not present

## 2018-03-27 DIAGNOSIS — L732 Hidradenitis suppurativa: Secondary | ICD-10-CM

## 2018-03-28 ENCOUNTER — Telehealth: Payer: Self-pay | Admitting: *Deleted

## 2018-03-28 DIAGNOSIS — L732 Hidradenitis suppurativa: Secondary | ICD-10-CM

## 2018-03-28 MED ORDER — METRONIDAZOLE 500 MG PO TABS
500.0000 mg | ORAL_TABLET | Freq: Three times a day (TID) | ORAL | 2 refills | Status: DC
Start: 2018-03-28 — End: 2018-05-28

## 2018-03-28 MED ORDER — CEPHALEXIN 500 MG PO CAPS: 500.0000 mg | ORAL_CAPSULE | Freq: Two times a day (BID) | ORAL | 2 refills | Status: DC

## 2018-03-28 NOTE — Telephone Encounter (Signed)
Patient's mother called to request refills of suppressive antibiotics. Sent to pharmacy of choice. Sapna is scheduled to follow up with Dr Hubert Azure, Lanice Schwab, RN

## 2018-03-29 DIAGNOSIS — D631 Anemia in chronic kidney disease: Secondary | ICD-10-CM | POA: Diagnosis not present

## 2018-03-29 DIAGNOSIS — D509 Iron deficiency anemia, unspecified: Secondary | ICD-10-CM | POA: Diagnosis not present

## 2018-03-29 DIAGNOSIS — N186 End stage renal disease: Secondary | ICD-10-CM | POA: Diagnosis not present

## 2018-03-29 DIAGNOSIS — N2581 Secondary hyperparathyroidism of renal origin: Secondary | ICD-10-CM | POA: Diagnosis not present

## 2018-03-29 DIAGNOSIS — E1129 Type 2 diabetes mellitus with other diabetic kidney complication: Secondary | ICD-10-CM | POA: Diagnosis not present

## 2018-04-01 DIAGNOSIS — E1129 Type 2 diabetes mellitus with other diabetic kidney complication: Secondary | ICD-10-CM | POA: Diagnosis not present

## 2018-04-01 DIAGNOSIS — N186 End stage renal disease: Secondary | ICD-10-CM | POA: Diagnosis not present

## 2018-04-01 DIAGNOSIS — N2581 Secondary hyperparathyroidism of renal origin: Secondary | ICD-10-CM | POA: Diagnosis not present

## 2018-04-01 DIAGNOSIS — D509 Iron deficiency anemia, unspecified: Secondary | ICD-10-CM | POA: Diagnosis not present

## 2018-04-01 DIAGNOSIS — D631 Anemia in chronic kidney disease: Secondary | ICD-10-CM | POA: Diagnosis not present

## 2018-04-03 DIAGNOSIS — D631 Anemia in chronic kidney disease: Secondary | ICD-10-CM | POA: Diagnosis not present

## 2018-04-03 DIAGNOSIS — D509 Iron deficiency anemia, unspecified: Secondary | ICD-10-CM | POA: Diagnosis not present

## 2018-04-03 DIAGNOSIS — N2581 Secondary hyperparathyroidism of renal origin: Secondary | ICD-10-CM | POA: Diagnosis not present

## 2018-04-03 DIAGNOSIS — N186 End stage renal disease: Secondary | ICD-10-CM | POA: Diagnosis not present

## 2018-04-03 DIAGNOSIS — E1129 Type 2 diabetes mellitus with other diabetic kidney complication: Secondary | ICD-10-CM | POA: Diagnosis not present

## 2018-04-05 DIAGNOSIS — N2581 Secondary hyperparathyroidism of renal origin: Secondary | ICD-10-CM | POA: Diagnosis not present

## 2018-04-05 DIAGNOSIS — N186 End stage renal disease: Secondary | ICD-10-CM | POA: Diagnosis not present

## 2018-04-05 DIAGNOSIS — D631 Anemia in chronic kidney disease: Secondary | ICD-10-CM | POA: Diagnosis not present

## 2018-04-05 DIAGNOSIS — D509 Iron deficiency anemia, unspecified: Secondary | ICD-10-CM | POA: Diagnosis not present

## 2018-04-05 DIAGNOSIS — E1129 Type 2 diabetes mellitus with other diabetic kidney complication: Secondary | ICD-10-CM | POA: Diagnosis not present

## 2018-04-08 DIAGNOSIS — N186 End stage renal disease: Secondary | ICD-10-CM | POA: Diagnosis not present

## 2018-04-08 DIAGNOSIS — N2581 Secondary hyperparathyroidism of renal origin: Secondary | ICD-10-CM | POA: Diagnosis not present

## 2018-04-08 DIAGNOSIS — D631 Anemia in chronic kidney disease: Secondary | ICD-10-CM | POA: Diagnosis not present

## 2018-04-08 DIAGNOSIS — D509 Iron deficiency anemia, unspecified: Secondary | ICD-10-CM | POA: Diagnosis not present

## 2018-04-08 DIAGNOSIS — E1129 Type 2 diabetes mellitus with other diabetic kidney complication: Secondary | ICD-10-CM | POA: Diagnosis not present

## 2018-04-10 DIAGNOSIS — E1129 Type 2 diabetes mellitus with other diabetic kidney complication: Secondary | ICD-10-CM | POA: Diagnosis not present

## 2018-04-10 DIAGNOSIS — N186 End stage renal disease: Secondary | ICD-10-CM | POA: Diagnosis not present

## 2018-04-10 DIAGNOSIS — N2581 Secondary hyperparathyroidism of renal origin: Secondary | ICD-10-CM | POA: Diagnosis not present

## 2018-04-10 DIAGNOSIS — D509 Iron deficiency anemia, unspecified: Secondary | ICD-10-CM | POA: Diagnosis not present

## 2018-04-10 DIAGNOSIS — D631 Anemia in chronic kidney disease: Secondary | ICD-10-CM | POA: Diagnosis not present

## 2018-04-12 DIAGNOSIS — N2581 Secondary hyperparathyroidism of renal origin: Secondary | ICD-10-CM | POA: Diagnosis not present

## 2018-04-12 DIAGNOSIS — E1129 Type 2 diabetes mellitus with other diabetic kidney complication: Secondary | ICD-10-CM | POA: Diagnosis not present

## 2018-04-12 DIAGNOSIS — D631 Anemia in chronic kidney disease: Secondary | ICD-10-CM | POA: Diagnosis not present

## 2018-04-12 DIAGNOSIS — N186 End stage renal disease: Secondary | ICD-10-CM | POA: Diagnosis not present

## 2018-04-12 DIAGNOSIS — D509 Iron deficiency anemia, unspecified: Secondary | ICD-10-CM | POA: Diagnosis not present

## 2018-04-15 DIAGNOSIS — D631 Anemia in chronic kidney disease: Secondary | ICD-10-CM | POA: Diagnosis not present

## 2018-04-15 DIAGNOSIS — N2581 Secondary hyperparathyroidism of renal origin: Secondary | ICD-10-CM | POA: Diagnosis not present

## 2018-04-15 DIAGNOSIS — D509 Iron deficiency anemia, unspecified: Secondary | ICD-10-CM | POA: Diagnosis not present

## 2018-04-15 DIAGNOSIS — E1129 Type 2 diabetes mellitus with other diabetic kidney complication: Secondary | ICD-10-CM | POA: Diagnosis not present

## 2018-04-15 DIAGNOSIS — N186 End stage renal disease: Secondary | ICD-10-CM | POA: Diagnosis not present

## 2018-04-16 ENCOUNTER — Ambulatory Visit: Payer: Medicare Other | Admitting: Podiatry

## 2018-04-17 DIAGNOSIS — N186 End stage renal disease: Secondary | ICD-10-CM | POA: Diagnosis not present

## 2018-04-17 DIAGNOSIS — N2581 Secondary hyperparathyroidism of renal origin: Secondary | ICD-10-CM | POA: Diagnosis not present

## 2018-04-17 DIAGNOSIS — D509 Iron deficiency anemia, unspecified: Secondary | ICD-10-CM | POA: Diagnosis not present

## 2018-04-17 DIAGNOSIS — D631 Anemia in chronic kidney disease: Secondary | ICD-10-CM | POA: Diagnosis not present

## 2018-04-17 DIAGNOSIS — E1129 Type 2 diabetes mellitus with other diabetic kidney complication: Secondary | ICD-10-CM | POA: Diagnosis not present

## 2018-04-18 ENCOUNTER — Other Ambulatory Visit (HOSPITAL_COMMUNITY): Payer: Self-pay | Admitting: Nephrology

## 2018-04-18 DIAGNOSIS — Z992 Dependence on renal dialysis: Principal | ICD-10-CM

## 2018-04-18 DIAGNOSIS — N186 End stage renal disease: Secondary | ICD-10-CM

## 2018-04-19 ENCOUNTER — Ambulatory Visit (HOSPITAL_COMMUNITY)
Admission: RE | Admit: 2018-04-19 | Discharge: 2018-04-19 | Disposition: A | Discharge status: Home / Self Care | Payer: Medicare Other | Source: Ambulatory Visit | Attending: Nephrology | Admitting: Nephrology

## 2018-04-19 ENCOUNTER — Other Ambulatory Visit (HOSPITAL_COMMUNITY): Payer: Self-pay | Admitting: Nephrology

## 2018-04-19 ENCOUNTER — Encounter (HOSPITAL_COMMUNITY): Payer: Self-pay | Admitting: Interventional Radiology

## 2018-04-19 DIAGNOSIS — Z992 Dependence on renal dialysis: Secondary | ICD-10-CM | POA: Insufficient documentation

## 2018-04-19 DIAGNOSIS — T82858A Stenosis of vascular prosthetic devices, implants and grafts, initial encounter: Secondary | ICD-10-CM | POA: Insufficient documentation

## 2018-04-19 DIAGNOSIS — N186 End stage renal disease: Secondary | ICD-10-CM | POA: Diagnosis not present

## 2018-04-19 DIAGNOSIS — Y832 Surgical operation with anastomosis, bypass or graft as the cause of abnormal reaction of the patient, or of later complication, without mention of misadventure at the time of the procedure: Secondary | ICD-10-CM | POA: Insufficient documentation

## 2018-04-19 HISTORY — PX: IR US GUIDE VASC ACCESS LEFT: IMG2389

## 2018-04-19 HISTORY — PX: IR AV DIALY SHUNT INTRO NEEDLE/INTRACATH INITIAL W/PTA/IMG LEFT: IMG6103

## 2018-04-19 MED ORDER — HEPARIN SODIUM (PORCINE) 1000 UNIT/ML IJ SOLN
INTRAMUSCULAR | Status: AC
  Administered 2018-04-19: 3000 [IU]
  Filled 2018-04-19: qty 1

## 2018-04-19 MED ORDER — METOPROLOL TARTRATE 5 MG/5ML IV SOLN
INTRAVENOUS | Status: AC
  Filled 2018-04-19: qty 5

## 2018-04-19 MED ORDER — LIDOCAINE HCL (PF) 1 % IJ SOLN
INTRAMUSCULAR | Status: DC | PRN
  Administered 2018-04-19: 5 mL

## 2018-04-19 MED ORDER — IOPAMIDOL (ISOVUE-300) INJECTION 61%
INTRAVENOUS | Status: AC
  Administered 2018-04-19: 50 mL
  Filled 2018-04-19: qty 100

## 2018-04-19 MED ORDER — METOPROLOL TARTRATE 5 MG/5ML IV SOLN
5.0000 mg | INTRAVENOUS | Status: DC | PRN
  Administered 2018-04-19: 5 mg via INTRAVENOUS

## 2018-04-19 MED ORDER — LIDOCAINE HCL 1 % IJ SOLN
INTRAMUSCULAR | Status: AC
  Filled 2018-04-19: qty 20

## 2018-04-19 NOTE — Procedures (Signed)
Interventional Radiology Procedure Note  Procedure: Left arm fistulogram and PTA x 2  Complications: Very painful procedure for patient, she will require moderate sedation in the future  Estimated Blood Loss: None  Recommendations: - [facility]  Signed,  Criselda Peaches, MD

## 2018-04-20 DIAGNOSIS — D631 Anemia in chronic kidney disease: Secondary | ICD-10-CM | POA: Diagnosis not present

## 2018-04-20 DIAGNOSIS — N186 End stage renal disease: Secondary | ICD-10-CM | POA: Diagnosis not present

## 2018-04-20 DIAGNOSIS — D509 Iron deficiency anemia, unspecified: Secondary | ICD-10-CM | POA: Diagnosis not present

## 2018-04-20 DIAGNOSIS — E1129 Type 2 diabetes mellitus with other diabetic kidney complication: Secondary | ICD-10-CM | POA: Diagnosis not present

## 2018-04-20 DIAGNOSIS — N2581 Secondary hyperparathyroidism of renal origin: Secondary | ICD-10-CM | POA: Diagnosis not present

## 2018-04-22 DIAGNOSIS — N186 End stage renal disease: Secondary | ICD-10-CM | POA: Diagnosis not present

## 2018-04-22 DIAGNOSIS — D631 Anemia in chronic kidney disease: Secondary | ICD-10-CM | POA: Diagnosis not present

## 2018-04-22 DIAGNOSIS — N2581 Secondary hyperparathyroidism of renal origin: Secondary | ICD-10-CM | POA: Diagnosis not present

## 2018-04-22 DIAGNOSIS — D509 Iron deficiency anemia, unspecified: Secondary | ICD-10-CM | POA: Diagnosis not present

## 2018-04-22 DIAGNOSIS — E1129 Type 2 diabetes mellitus with other diabetic kidney complication: Secondary | ICD-10-CM | POA: Diagnosis not present

## 2018-04-24 DIAGNOSIS — N186 End stage renal disease: Secondary | ICD-10-CM | POA: Diagnosis not present

## 2018-04-24 DIAGNOSIS — E1129 Type 2 diabetes mellitus with other diabetic kidney complication: Secondary | ICD-10-CM | POA: Diagnosis not present

## 2018-04-24 DIAGNOSIS — D631 Anemia in chronic kidney disease: Secondary | ICD-10-CM | POA: Diagnosis not present

## 2018-04-24 DIAGNOSIS — N2581 Secondary hyperparathyroidism of renal origin: Secondary | ICD-10-CM | POA: Diagnosis not present

## 2018-04-24 DIAGNOSIS — D509 Iron deficiency anemia, unspecified: Secondary | ICD-10-CM | POA: Diagnosis not present

## 2018-04-26 DIAGNOSIS — N186 End stage renal disease: Secondary | ICD-10-CM | POA: Diagnosis not present

## 2018-04-26 DIAGNOSIS — D509 Iron deficiency anemia, unspecified: Secondary | ICD-10-CM | POA: Diagnosis not present

## 2018-04-26 DIAGNOSIS — E1129 Type 2 diabetes mellitus with other diabetic kidney complication: Secondary | ICD-10-CM | POA: Diagnosis not present

## 2018-04-26 DIAGNOSIS — N2581 Secondary hyperparathyroidism of renal origin: Secondary | ICD-10-CM | POA: Diagnosis not present

## 2018-04-26 DIAGNOSIS — D631 Anemia in chronic kidney disease: Secondary | ICD-10-CM | POA: Diagnosis not present

## 2018-04-27 DIAGNOSIS — Z992 Dependence on renal dialysis: Secondary | ICD-10-CM | POA: Diagnosis not present

## 2018-04-27 DIAGNOSIS — E1129 Type 2 diabetes mellitus with other diabetic kidney complication: Secondary | ICD-10-CM | POA: Diagnosis not present

## 2018-04-27 DIAGNOSIS — N186 End stage renal disease: Secondary | ICD-10-CM | POA: Diagnosis not present

## 2018-04-29 DIAGNOSIS — N186 End stage renal disease: Secondary | ICD-10-CM | POA: Diagnosis not present

## 2018-04-29 DIAGNOSIS — N2581 Secondary hyperparathyroidism of renal origin: Secondary | ICD-10-CM | POA: Diagnosis not present

## 2018-04-29 DIAGNOSIS — D631 Anemia in chronic kidney disease: Secondary | ICD-10-CM | POA: Diagnosis not present

## 2018-04-29 DIAGNOSIS — D509 Iron deficiency anemia, unspecified: Secondary | ICD-10-CM | POA: Diagnosis not present

## 2018-04-29 DIAGNOSIS — E1129 Type 2 diabetes mellitus with other diabetic kidney complication: Secondary | ICD-10-CM | POA: Diagnosis not present

## 2018-04-30 ENCOUNTER — Ambulatory Visit (INDEPENDENT_AMBULATORY_CARE_PROVIDER_SITE_OTHER): Payer: Medicare Other | Admitting: Podiatry

## 2018-04-30 ENCOUNTER — Encounter: Payer: Self-pay | Admitting: Podiatry

## 2018-04-30 DIAGNOSIS — M79674 Pain in right toe(s): Secondary | ICD-10-CM | POA: Diagnosis not present

## 2018-04-30 DIAGNOSIS — E1149 Type 2 diabetes mellitus with other diabetic neurological complication: Secondary | ICD-10-CM

## 2018-04-30 DIAGNOSIS — M79675 Pain in left toe(s): Secondary | ICD-10-CM

## 2018-04-30 DIAGNOSIS — B351 Tinea unguium: Secondary | ICD-10-CM | POA: Diagnosis not present

## 2018-04-30 DIAGNOSIS — Q828 Other specified congenital malformations of skin: Secondary | ICD-10-CM

## 2018-05-01 ENCOUNTER — Encounter (HOSPITAL_COMMUNITY): Payer: Self-pay | Admitting: Interventional Radiology

## 2018-05-01 DIAGNOSIS — N2581 Secondary hyperparathyroidism of renal origin: Secondary | ICD-10-CM | POA: Diagnosis not present

## 2018-05-01 DIAGNOSIS — D631 Anemia in chronic kidney disease: Secondary | ICD-10-CM | POA: Diagnosis not present

## 2018-05-01 DIAGNOSIS — D509 Iron deficiency anemia, unspecified: Secondary | ICD-10-CM | POA: Diagnosis not present

## 2018-05-01 DIAGNOSIS — N186 End stage renal disease: Secondary | ICD-10-CM | POA: Diagnosis not present

## 2018-05-01 DIAGNOSIS — E1129 Type 2 diabetes mellitus with other diabetic kidney complication: Secondary | ICD-10-CM | POA: Diagnosis not present

## 2018-05-03 DIAGNOSIS — D631 Anemia in chronic kidney disease: Secondary | ICD-10-CM | POA: Diagnosis not present

## 2018-05-03 DIAGNOSIS — D509 Iron deficiency anemia, unspecified: Secondary | ICD-10-CM | POA: Diagnosis not present

## 2018-05-03 DIAGNOSIS — N186 End stage renal disease: Secondary | ICD-10-CM | POA: Diagnosis not present

## 2018-05-03 DIAGNOSIS — E1129 Type 2 diabetes mellitus with other diabetic kidney complication: Secondary | ICD-10-CM | POA: Diagnosis not present

## 2018-05-03 DIAGNOSIS — N2581 Secondary hyperparathyroidism of renal origin: Secondary | ICD-10-CM | POA: Diagnosis not present

## 2018-05-03 NOTE — Progress Notes (Signed)
Subjective: 57 y.o. returns the office today for painful, elongated, thickened toenails which she cannot trim herself as well as for calluses to her heels. Denies any redness or drainage around the nails. She has calluses to both of her heels was to cause discomfort at times. Denies any acute changes since last appointment and no new complaints today. Denies any systemic complaints such as fevers, chills, nausea, vomiting.   Objective: AAO 3, NAD- uses oxygen  DP/PT pulses palpable, CRT less than 3 seconds Protective sensation decreased with Simms Weinstein monofilament Nails hypertrophic, dystrophic, elongated, brittle, discolored 10. There is tenderness overlying the nails 1-5 bilaterally. There is no surrounding erythema or drainage along the nail sites. Hyperkeratotic lesions to the left right plantar heels. Upon debridement there is no underlying ulceration, drainage or any signs of infection.  Overall these appear to be improved. No open lesions or pre-ulcerative lesions are identified. No other areas of tenderness bilateral lower extremities. No overlying edema, erythema, increased warmth. No pain with calf compression, swelling, warmth, erythema.  Assessment: Patient presents with symptomatic onychomycosis, calluses 2  Plan: -Treatment options including alternatives, risks, complications were discussed -Nails sharply debrided 10 without complication/bleeding. -Hyperkeratotic lesion sharply debrided 2 without complications or bleeding. -Discussed daily foot inspection. If there are any changes, to call the office immediately.  -Follow-up in 3 months or sooner if any problems are to arise. In the meantime, encouraged to call the office with any questions, concerns, changes symptoms.  Celesta Gentile, DPM

## 2018-05-06 DIAGNOSIS — D631 Anemia in chronic kidney disease: Secondary | ICD-10-CM | POA: Diagnosis not present

## 2018-05-06 DIAGNOSIS — D509 Iron deficiency anemia, unspecified: Secondary | ICD-10-CM | POA: Diagnosis not present

## 2018-05-06 DIAGNOSIS — N2581 Secondary hyperparathyroidism of renal origin: Secondary | ICD-10-CM | POA: Diagnosis not present

## 2018-05-06 DIAGNOSIS — N186 End stage renal disease: Secondary | ICD-10-CM | POA: Diagnosis not present

## 2018-05-06 DIAGNOSIS — E1129 Type 2 diabetes mellitus with other diabetic kidney complication: Secondary | ICD-10-CM | POA: Diagnosis not present

## 2018-05-08 DIAGNOSIS — D631 Anemia in chronic kidney disease: Secondary | ICD-10-CM | POA: Diagnosis not present

## 2018-05-08 DIAGNOSIS — D509 Iron deficiency anemia, unspecified: Secondary | ICD-10-CM | POA: Diagnosis not present

## 2018-05-08 DIAGNOSIS — E1129 Type 2 diabetes mellitus with other diabetic kidney complication: Secondary | ICD-10-CM | POA: Diagnosis not present

## 2018-05-08 DIAGNOSIS — N186 End stage renal disease: Secondary | ICD-10-CM | POA: Diagnosis not present

## 2018-05-08 DIAGNOSIS — N2581 Secondary hyperparathyroidism of renal origin: Secondary | ICD-10-CM | POA: Diagnosis not present

## 2018-05-10 DIAGNOSIS — N2581 Secondary hyperparathyroidism of renal origin: Secondary | ICD-10-CM | POA: Diagnosis not present

## 2018-05-10 DIAGNOSIS — E1129 Type 2 diabetes mellitus with other diabetic kidney complication: Secondary | ICD-10-CM | POA: Diagnosis not present

## 2018-05-10 DIAGNOSIS — D509 Iron deficiency anemia, unspecified: Secondary | ICD-10-CM | POA: Diagnosis not present

## 2018-05-10 DIAGNOSIS — D631 Anemia in chronic kidney disease: Secondary | ICD-10-CM | POA: Diagnosis not present

## 2018-05-10 DIAGNOSIS — N186 End stage renal disease: Secondary | ICD-10-CM | POA: Diagnosis not present

## 2018-05-13 DIAGNOSIS — E1129 Type 2 diabetes mellitus with other diabetic kidney complication: Secondary | ICD-10-CM | POA: Diagnosis not present

## 2018-05-13 DIAGNOSIS — N2581 Secondary hyperparathyroidism of renal origin: Secondary | ICD-10-CM | POA: Diagnosis not present

## 2018-05-13 DIAGNOSIS — N186 End stage renal disease: Secondary | ICD-10-CM | POA: Diagnosis not present

## 2018-05-13 DIAGNOSIS — D509 Iron deficiency anemia, unspecified: Secondary | ICD-10-CM | POA: Diagnosis not present

## 2018-05-13 DIAGNOSIS — D631 Anemia in chronic kidney disease: Secondary | ICD-10-CM | POA: Diagnosis not present

## 2018-05-15 DIAGNOSIS — D509 Iron deficiency anemia, unspecified: Secondary | ICD-10-CM | POA: Diagnosis not present

## 2018-05-15 DIAGNOSIS — N2581 Secondary hyperparathyroidism of renal origin: Secondary | ICD-10-CM | POA: Diagnosis not present

## 2018-05-15 DIAGNOSIS — D631 Anemia in chronic kidney disease: Secondary | ICD-10-CM | POA: Diagnosis not present

## 2018-05-15 DIAGNOSIS — N186 End stage renal disease: Secondary | ICD-10-CM | POA: Diagnosis not present

## 2018-05-15 DIAGNOSIS — E1129 Type 2 diabetes mellitus with other diabetic kidney complication: Secondary | ICD-10-CM | POA: Diagnosis not present

## 2018-05-17 DIAGNOSIS — D631 Anemia in chronic kidney disease: Secondary | ICD-10-CM | POA: Diagnosis not present

## 2018-05-17 DIAGNOSIS — N2581 Secondary hyperparathyroidism of renal origin: Secondary | ICD-10-CM | POA: Diagnosis not present

## 2018-05-17 DIAGNOSIS — D509 Iron deficiency anemia, unspecified: Secondary | ICD-10-CM | POA: Diagnosis not present

## 2018-05-17 DIAGNOSIS — E1129 Type 2 diabetes mellitus with other diabetic kidney complication: Secondary | ICD-10-CM | POA: Diagnosis not present

## 2018-05-17 DIAGNOSIS — N186 End stage renal disease: Secondary | ICD-10-CM | POA: Diagnosis not present

## 2018-05-20 DIAGNOSIS — D631 Anemia in chronic kidney disease: Secondary | ICD-10-CM | POA: Diagnosis not present

## 2018-05-20 DIAGNOSIS — D509 Iron deficiency anemia, unspecified: Secondary | ICD-10-CM | POA: Diagnosis not present

## 2018-05-20 DIAGNOSIS — E1129 Type 2 diabetes mellitus with other diabetic kidney complication: Secondary | ICD-10-CM | POA: Diagnosis not present

## 2018-05-20 DIAGNOSIS — N2581 Secondary hyperparathyroidism of renal origin: Secondary | ICD-10-CM | POA: Diagnosis not present

## 2018-05-20 DIAGNOSIS — N186 End stage renal disease: Secondary | ICD-10-CM | POA: Diagnosis not present

## 2018-05-22 DIAGNOSIS — D509 Iron deficiency anemia, unspecified: Secondary | ICD-10-CM | POA: Diagnosis not present

## 2018-05-22 DIAGNOSIS — N186 End stage renal disease: Secondary | ICD-10-CM | POA: Diagnosis not present

## 2018-05-22 DIAGNOSIS — E1129 Type 2 diabetes mellitus with other diabetic kidney complication: Secondary | ICD-10-CM | POA: Diagnosis not present

## 2018-05-22 DIAGNOSIS — N2581 Secondary hyperparathyroidism of renal origin: Secondary | ICD-10-CM | POA: Diagnosis not present

## 2018-05-22 DIAGNOSIS — D631 Anemia in chronic kidney disease: Secondary | ICD-10-CM | POA: Diagnosis not present

## 2018-05-24 DIAGNOSIS — E1129 Type 2 diabetes mellitus with other diabetic kidney complication: Secondary | ICD-10-CM | POA: Diagnosis not present

## 2018-05-24 DIAGNOSIS — D509 Iron deficiency anemia, unspecified: Secondary | ICD-10-CM | POA: Diagnosis not present

## 2018-05-24 DIAGNOSIS — N186 End stage renal disease: Secondary | ICD-10-CM | POA: Diagnosis not present

## 2018-05-24 DIAGNOSIS — D631 Anemia in chronic kidney disease: Secondary | ICD-10-CM | POA: Diagnosis not present

## 2018-05-24 DIAGNOSIS — N2581 Secondary hyperparathyroidism of renal origin: Secondary | ICD-10-CM | POA: Diagnosis not present

## 2018-05-27 DIAGNOSIS — L732 Hidradenitis suppurativa: Secondary | ICD-10-CM | POA: Diagnosis not present

## 2018-05-27 DIAGNOSIS — N186 End stage renal disease: Secondary | ICD-10-CM | POA: Diagnosis not present

## 2018-05-27 DIAGNOSIS — Z992 Dependence on renal dialysis: Secondary | ICD-10-CM | POA: Diagnosis not present

## 2018-05-27 DIAGNOSIS — A419 Sepsis, unspecified organism: Secondary | ICD-10-CM | POA: Diagnosis not present

## 2018-05-27 DIAGNOSIS — N2581 Secondary hyperparathyroidism of renal origin: Secondary | ICD-10-CM | POA: Diagnosis not present

## 2018-05-27 DIAGNOSIS — E1129 Type 2 diabetes mellitus with other diabetic kidney complication: Secondary | ICD-10-CM | POA: Diagnosis not present

## 2018-05-27 DIAGNOSIS — D509 Iron deficiency anemia, unspecified: Secondary | ICD-10-CM | POA: Diagnosis not present

## 2018-05-27 DIAGNOSIS — D631 Anemia in chronic kidney disease: Secondary | ICD-10-CM | POA: Diagnosis not present

## 2018-05-28 ENCOUNTER — Ambulatory Visit (INDEPENDENT_AMBULATORY_CARE_PROVIDER_SITE_OTHER): Payer: Medicare Other | Admitting: Infectious Disease

## 2018-05-28 ENCOUNTER — Encounter: Payer: Self-pay | Admitting: Infectious Disease

## 2018-05-28 VITALS — BP 111/73 | HR 89 | Temp 98.5°F

## 2018-05-28 DIAGNOSIS — Z992 Dependence on renal dialysis: Secondary | ICD-10-CM

## 2018-05-28 DIAGNOSIS — A419 Sepsis, unspecified organism: Secondary | ICD-10-CM

## 2018-05-28 DIAGNOSIS — N186 End stage renal disease: Secondary | ICD-10-CM

## 2018-05-28 DIAGNOSIS — L732 Hidradenitis suppurativa: Secondary | ICD-10-CM | POA: Diagnosis not present

## 2018-05-28 MED ORDER — CIPROFLOXACIN HCL 500 MG PO TABS: 500.0000 mg | ORAL_TABLET | Freq: Every day | ORAL | 1 refills | Status: DC

## 2018-05-28 MED ORDER — DOXYCYCLINE HYCLATE 100 MG PO TABS: 100.0000 mg | ORAL_TABLET | Freq: Two times a day (BID) | ORAL | 1 refills | Status: DC

## 2018-05-28 NOTE — Progress Notes (Signed)
Subjective:   Chief complaint : followup for HS and superinfection   Patient ID: [name], female    DOB: August 04, 1961, 57 y.o.   MRN: [id number]  HPI  [name] is a 57 year-old lady with morbid obesity hidradenitis with chronic kidney disease on hemodialysis who developed streptococcal bacteremia in September 2017when she was an inpatient and whom we treated with vancomycin for streptococcal bacteremia along with ceftaz edema and oral Flagyl to cover her at [facility] which was flaring. After she completed the IV antibiotics for hidradenitis flared again and her antibiotics were extended by Dr. Megan Salon after he saw her in clinic.  When I last saw her we changed her over to oral Bactrim with the hopes that this would provide some further coverage for organisms involved with her infection of her hidradenitis lesions. Unfortunate she developed a sensation of hearing music in both of her ears constantly even when no music was playing. This was prominent while she was taking her Bactrim but has subsided since she has stopped that and change over to doxycycline but is not completely disappeared. She has seen her audiologist but apparently not discuss this with them. It is not clear to me that her hearing has been tested while she's had this symptom.  We changed  her over to doxycycline but since then she has experienced worsening drainage from 3 areas in particular to on her left buttocks. They're quite painful and cause her to feel uncomfortable sitting.  We took cultures but these grew only corynebacterium though GNR was seen on GS. I continued doxy and added levaquin in February. She was admitted on 02/02/17 with fever, leukocytosis, SIRS, started on vanco/zosyn. She apparently refused surgical consultation.   She was changed to vancomycin an ceftazidime for ease of dosing with dialysis and she is feeling much better with no more fevers and improvement in her drainage posterior buttocks also in her inguinal  area anteriorly. She did not want me to examine these wound in March of 2018.  In the interim she experienced increased drainage from her perineum including ulcers on buttocks and from groin.  We gave her 2 week course of IV vancomycin with HD with improvement in drainage but as soon as it was stopped she experienced recurrence of bleeding, foul smelling drainge from these areas. We then went to keflex and flagyl with improvement in her drainage from buttocks and axilla.   Several visits ago we gave her a course  of IV vancomycin with HD prior to a vacation at the beach that she went on and her drainage improved with vancomycin.  When she got back he mother and she tried to have her off of abx but she had worsening of drainage. She restarted the keflex and flagyl and has had improvement and stability now in drainage and size of lesions.   Mom showed me photos of lesions from thigh, vaginal area and buttocks that are stable. One around buttocks does still have fairly sig drainage.  When I last saw her in December she was feeling the best she had in some time.  Mom again brought photos of different areas of the body on [name] and we did not examine [name] herself today.  Mom showed [place] pictures of the areas on her buttocks were the erythema and drainage has improved dramatically.  There is an area on her labia that has become more swollen and painful in which there is some concern about infection   She has been taking  cephalexin and metronidazole since we saw her in December.   Past Medical History:  Diagnosis Date  . Chronic diastolic CHF (congestive heart failure) ([place])   . Chronic kidney disease (CKD), stage III (moderate) ([place])    [name] nephrology lov note 05-12-2013 on pt chart now on HD  . DCM (dilated cardiomyopathy) ([place])    nonischemic - EF now 60-65% by echo 2017  . Depression   . DM type 2 (diabetes mellitus, type 2) ([facility])    diet controlledwith retinopathy and nephropathy   . ESRD on hemodialysis ([place]) 08/21/2017  . GERD (gastroesophageal reflux disease)   . H/O cardiac arrest 10/2012  . Hard of hearing 10/2012   now wears 2 hearing aids  . History of hemodialysis dec 2013  . HTN (hypertension)   . Hydradenitis   . Hyperkalemia   . Hyperlipidemia   . Hypothyroidism   . Iron deficiency anemia   . Morbid obesity ([place])   . Multinodular goiter   . Neuropathic pain 08/21/2017  . Pneumonia Nov 10, 2012  . Sleep apnea    no test yet per mother 10/22/13   . Tinnitus 12/26/2016  . Typical atrial flutter ([place]) 04/10/2017  . Urinary tract infection    taking antibiotics for 3 days prior to surgery    Past Surgical History:  Procedure Laterality Date  . AV FISTULA PLACEMENT Left 07/25/2013   Procedure: ARTERIOVENOUS (AV) FISTULA CREATION ;  Surgeon: Rosetta Posner, MD;  Location: [facility];  Service: Vascular;  Laterality: Left;  . CYSTOSCOPY W/ RETROGRADES  11/16/2012   Procedure: CYSTOSCOPY WITH RETROGRADE PYELOGRAM;  Surgeon: Reece Packer, MD;  Location: [facility];  Service: Urology;  Laterality: Bilateral;  CYSTOSCOPY,BILATERAL RETROGRADE PYELOGRAM/ REMOVAL LEFT URETERAL STENT/ FULGERATION BLADDER MUCOSA/ INSERTION RIGHT URETERAL STENT  . CYSTOSCOPY W/ URETERAL STENT PLACEMENT  05/28/2012   Procedure: CYSTOSCOPY WITH RETROGRADE PYELOGRAM/URETERAL STENT PLACEMENT;  Surgeon: Reece Packer, MD;  Location: [facility];  Service: Urology;  Laterality: Left;  . CYSTOSCOPY WITH RETROGRADE PYELOGRAM, URETEROSCOPY AND STENT PLACEMENT Right 06/23/2013   Procedure: CYSTOSCOPY WITH RIGHT URETEROSCOPY, RIGHT  RETROGRADE PYELOGRAM, WITH LASER LIPOTRIPSY AND RIGHT URETERAL STENT EXCHANGE ;  Surgeon: Molli Hazard, MD;  Location: [facility];  Service: Urology;  Laterality: Right;  STENT EXCHANGE    . CYSTOSCOPY WITH RETROGRADE PYELOGRAM, URETEROSCOPY AND STENT PLACEMENT Right 11/06/2013   Procedure: CYSTOSCOPY WITH RETROGRADE PYELOGRAM, URETEROSCOPY STONE REMOVAL AND STENT  REMOVAL;  Surgeon: Molli Hazard, MD;  Location: [facility];  Service: Urology;  Laterality: Right;  . HOLMIUM LASER APPLICATION N/A 8/67/6195   Procedure: HOLMIUM LASER APPLICATION;  Surgeon: Molli Hazard, MD;  Location: [facility];  Service: Urology;  Laterality: N/A;  . INSERTION OF DIALYSIS CATHETER N/A 09/24/2013   Procedure: INSERTION OF DIALYSIS CATHETER;  Surgeon: Rosetta Posner, MD;  Location: [facility];  Service: Vascular;  Laterality: N/A;  . IR AV DIALY SHUNT INTRO NEEDLE/INTRACATH INITIAL W/PTA/IMG LEFT  04/19/2018  . IR US GUIDE VASC ACCESS LEFT  04/19/2018  . PORT-A-CATH REMOVAL    . PORTACATH PLACEMENT    . REVISON OF ARTERIOVENOUS FISTULA Left 07/25/2016   Procedure: REVISON OF LEFT ARTERIOVENOUS FISTULA;  Surgeon: Elam Dutch, MD;  Location: [facility];  Service: Vascular;  Laterality: Left;    Family History  Problem Relation Age of Onset  . Coronary artery disease Mother   . Hypertension Mother   . Diabetes type II Mother   . Coronary artery disease Father   .  Hypertension Father   . Malignant hyperthermia Father   . Cancer Maternal Grandfather        ? Type  . Kidney failure Maternal Grandmother       Social History   Socioeconomic History  . Marital status: Married    Spouse name: Not on file  . Number of children: Not on file  . Years of education: Not on file  . Highest education level: Not on file  Occupational History  . Not on file  Social Needs  . Financial resource strain: Not on file  . Food insecurity:    Worry: Not on file    Inability: Not on file  . Transportation needs:    Medical: Not on file    Non-medical: Not on file  Tobacco Use  . Smoking status: Former Smoker    Packs/day: 0.25    Years: 1.00    Pack years: 0.25    Types: Cigarettes    Last attempt to quit: 11/28/1979    Years since quitting: 38.5  . Smokeless tobacco: Never Used  Substance and Sexual Activity  . Alcohol use: No  . Drug use: No  . Sexual activity: Never      Birth control/protection: Abstinence  Lifestyle  . Physical activity:    Days per week: Not on file    Minutes per session: Not on file  . Stress: Not on file  Relationships  . Social connections:    Talks on phone: Not on file    Gets together: Not on file    Attends religious service: Not on file    Active member of club or organization: Not on file    Attends meetings of clubs or organizations: Not on file    Relationship status: Not on file  Other Topics Concern  . Not on file  Social History Narrative  . Not on file    Allergies  Allergen Reactions  . Rosiglitazone Other (See Comments)  . Amoxicillin Rash and Other (See Comments)    Tolerated Zosyn 01/2013. [place]  . Amoxicillin-Pot Clavulanate Diarrhea     Current Outpatient Medications:  .  aspirin EC 81 MG tablet, Take 81 mg by mouth at bedtime. , Disp: , Rfl:  .  Calcium Acetate 667 MG TABS, Take 667 mg by mouth 3 (three) times daily with meals. And snacks, Disp: , Rfl:  .  cephALEXin (KEFLEX) 500 MG capsule, Take 1 capsule (500 mg total) by mouth 2 (two) times daily., Disp: 60 capsule, Rfl: 2 .  diltiazem (CARDIZEM) 30 MG tablet, Take 1 tablet every 4 hours AS NEEDED for rapid heart rate lasting more than 30 mins as long as top blood pressure >100., Disp: 30 tablet, Rfl: 2 .  DULoxetine (CYMBALTA) 60 MG capsule, Take 1 capsule (60 mg total) by mouth daily., Disp: 30 capsule, Rfl: 0 .  DYMISTA 137-50 MCG/ACT SUSP, PLACE 2 SPRAYS INTO THE NOSE DAILY., Disp: 1 Bottle, Rfl: 1 .  famotidine (PEPCID) 20 MG tablet, TAKE 1 TABLET BY MOUTH AT BEDTIME (Patient taking differently: Take 20 mg by mouth at bedtime), Disp: 30 tablet, Rfl: 0 .  feeding supplement, RESOURCE BREEZE, (RESOURCE BREEZE) LIQD, Take 2 Containers by mouth 2 (two) times daily between meals. , Disp: , Rfl:  .  glipiZIDE (GLUCOTROL) 5 MG tablet, Take 0.5 tablets (2.5 mg total) by mouth daily., Disp: 30 tablet, Rfl: 0 .  levothyroxine (SYNTHROID,  LEVOTHROID) 75 MCG tablet, Take 75 mcg by mouth daily., Disp: ,  Rfl: 0 .  metroNIDAZOLE (FLAGYL) 500 MG tablet, Take 1 tablet (500 mg total) by mouth 3 (three) times daily., Disp: 90 tablet, Rfl: 2 .  midodrine (PROAMATINE) 10 MG tablet, Take 1 tablet (10 mg total) by mouth 3 (three) times daily. (Patient taking differently: Take 10 mg by mouth every Monday, Wednesday, and Friday. Dialysis days), Disp: 90 tablet, Rfl: 0 .  multivitamin (RENA-VIT) TABS tablet, Take 1 tablet by mouth daily., Disp: , Rfl: 6 .  oxyCODONE-acetaminophen (PERCOCET/ROXICET) 5-325 MG tablet, Take 1-2 tablets by mouth every 6 (six) hours as needed. Patient has not taken since started dialysis 08/2013 due to causes hypotension (Patient taking differently: Take 1-2 tablets by mouth every 6 (six) hours as needed for moderate pain. ), Disp: 6 tablet, Rfl: 0 .  pravastatin (PRAVACHOL) 40 MG tablet, Take 40 mg by mouth daily. Patient takes in am, Disp: , Rfl:  .  Probiotic Product (PROBIOTIC DAILY PO), Take 1 tablet by mouth daily. , Disp: , Rfl:  .  sevelamer carbonate (RENVELA) 800 MG tablet, Take 2,400 mg by mouth 3 (three) times daily with meals., Disp: , Rfl:  .  sucroferric oxyhydroxide (VELPHORO) 500 MG chewable tablet, Chew 500 mg by mouth 3 (three) times daily with meals., Disp: , Rfl:     Review of Systems  Constitutional: Negative for chills, diaphoresis and fever.  HENT: Negative for congestion, hearing loss, sore throat and tinnitus.   Respiratory: Positive for shortness of breath. Negative for cough and wheezing.   Cardiovascular: Negative for chest pain and leg swelling.  Gastrointestinal: Negative for abdominal pain, blood in stool, constipation, diarrhea, nausea and vomiting.  Genitourinary: Negative for dysuria, flank pain and hematuria.  Musculoskeletal: Negative for back pain and myalgias.  Skin: Positive for color change and wound.  Neurological: Negative for dizziness, weakness and headaches.    Hematological: Does not bruise/bleed easily.  Psychiatric/Behavioral: Negative for suicidal ideas. The patient is not nervous/anxious.        Objective:   Physical Exam  Constitutional: She is oriented to person, place, and time. She appears well-developed and well-nourished. No distress.  HENT:  Head: Normocephalic and atraumatic.  Eyes: Conjunctivae and EOM are normal. No scleral icterus.  Neck: Normal range of motion. Neck supple.  Cardiovascular: Normal rate and normal heart sounds. An irregularly irregular rhythm present. Exam reveals no gallop and no friction rub.  No murmur heard. Pulmonary/Chest: No respiratory distress. She has no wheezes.  Breathing through pursed lips and anxious but saturations at 95%  Abdominal: She exhibits no distension.  Musculoskeletal: She exhibits edema.  Neurological: She is alert and oriented to person, place, and time. Coordination normal.  Skin: Skin is warm and dry. She is not diaphoretic.  Did not examine HS lesions due to her discomorfort and per her request. I did examine photos taken by her mother again today on May 28, 2018  Psychiatric: She has a normal mood and affect. Her behavior is normal. Judgment and thought content normal.  Nursing note and vitals reviewed.         Assessment & Plan:   Hidradenitis suppurativa:  We will have her apply warm compresses gently to the labia for several times a day potentially 10 minutes time.  I will also give her a course of ciprofloxacin 500 mg daily along with doxycycline 100 minutes twice daily.  Note there is a drug drug interaction with ciprofloxacin potentially titrating hypoglycemia in the context of 1 of her oral hypoglycemic drugs.  Have asked her to drop the dose of this in half and to follow-up with her primary care doctor.  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

## 2018-05-29 DIAGNOSIS — N186 End stage renal disease: Secondary | ICD-10-CM | POA: Diagnosis not present

## 2018-05-29 DIAGNOSIS — D509 Iron deficiency anemia, unspecified: Secondary | ICD-10-CM | POA: Diagnosis not present

## 2018-05-29 DIAGNOSIS — E1129 Type 2 diabetes mellitus with other diabetic kidney complication: Secondary | ICD-10-CM | POA: Diagnosis not present

## 2018-05-29 DIAGNOSIS — D631 Anemia in chronic kidney disease: Secondary | ICD-10-CM | POA: Diagnosis not present

## 2018-05-29 DIAGNOSIS — N2581 Secondary hyperparathyroidism of renal origin: Secondary | ICD-10-CM | POA: Diagnosis not present

## 2018-05-31 DIAGNOSIS — N2581 Secondary hyperparathyroidism of renal origin: Secondary | ICD-10-CM | POA: Diagnosis not present

## 2018-05-31 DIAGNOSIS — E1129 Type 2 diabetes mellitus with other diabetic kidney complication: Secondary | ICD-10-CM | POA: Diagnosis not present

## 2018-05-31 DIAGNOSIS — N186 End stage renal disease: Secondary | ICD-10-CM | POA: Diagnosis not present

## 2018-05-31 DIAGNOSIS — D509 Iron deficiency anemia, unspecified: Secondary | ICD-10-CM | POA: Diagnosis not present

## 2018-05-31 DIAGNOSIS — D631 Anemia in chronic kidney disease: Secondary | ICD-10-CM | POA: Diagnosis not present

## 2018-06-03 DIAGNOSIS — N2581 Secondary hyperparathyroidism of renal origin: Secondary | ICD-10-CM | POA: Diagnosis not present

## 2018-06-03 DIAGNOSIS — N186 End stage renal disease: Secondary | ICD-10-CM | POA: Diagnosis not present

## 2018-06-03 DIAGNOSIS — D631 Anemia in chronic kidney disease: Secondary | ICD-10-CM | POA: Diagnosis not present

## 2018-06-03 DIAGNOSIS — E1129 Type 2 diabetes mellitus with other diabetic kidney complication: Secondary | ICD-10-CM | POA: Diagnosis not present

## 2018-06-03 DIAGNOSIS — D509 Iron deficiency anemia, unspecified: Secondary | ICD-10-CM | POA: Diagnosis not present

## 2018-06-05 DIAGNOSIS — N2581 Secondary hyperparathyroidism of renal origin: Secondary | ICD-10-CM | POA: Diagnosis not present

## 2018-06-05 DIAGNOSIS — D509 Iron deficiency anemia, unspecified: Secondary | ICD-10-CM | POA: Diagnosis not present

## 2018-06-05 DIAGNOSIS — N186 End stage renal disease: Secondary | ICD-10-CM | POA: Diagnosis not present

## 2018-06-05 DIAGNOSIS — D631 Anemia in chronic kidney disease: Secondary | ICD-10-CM | POA: Diagnosis not present

## 2018-06-05 DIAGNOSIS — E1129 Type 2 diabetes mellitus with other diabetic kidney complication: Secondary | ICD-10-CM | POA: Diagnosis not present

## 2018-06-07 DIAGNOSIS — N2581 Secondary hyperparathyroidism of renal origin: Secondary | ICD-10-CM | POA: Diagnosis not present

## 2018-06-07 DIAGNOSIS — D509 Iron deficiency anemia, unspecified: Secondary | ICD-10-CM | POA: Diagnosis not present

## 2018-06-07 DIAGNOSIS — D631 Anemia in chronic kidney disease: Secondary | ICD-10-CM | POA: Diagnosis not present

## 2018-06-07 DIAGNOSIS — E1129 Type 2 diabetes mellitus with other diabetic kidney complication: Secondary | ICD-10-CM | POA: Diagnosis not present

## 2018-06-07 DIAGNOSIS — N186 End stage renal disease: Secondary | ICD-10-CM | POA: Diagnosis not present

## 2018-06-10 DIAGNOSIS — E1129 Type 2 diabetes mellitus with other diabetic kidney complication: Secondary | ICD-10-CM | POA: Diagnosis not present

## 2018-06-10 DIAGNOSIS — N186 End stage renal disease: Secondary | ICD-10-CM | POA: Diagnosis not present

## 2018-06-10 DIAGNOSIS — D509 Iron deficiency anemia, unspecified: Secondary | ICD-10-CM | POA: Diagnosis not present

## 2018-06-10 DIAGNOSIS — D631 Anemia in chronic kidney disease: Secondary | ICD-10-CM | POA: Diagnosis not present

## 2018-06-10 DIAGNOSIS — N2581 Secondary hyperparathyroidism of renal origin: Secondary | ICD-10-CM | POA: Diagnosis not present

## 2018-06-11 ENCOUNTER — Ambulatory Visit: Payer: Medicare Other | Admitting: Cardiology

## 2018-06-12 DIAGNOSIS — N2581 Secondary hyperparathyroidism of renal origin: Secondary | ICD-10-CM | POA: Diagnosis not present

## 2018-06-12 DIAGNOSIS — D631 Anemia in chronic kidney disease: Secondary | ICD-10-CM | POA: Diagnosis not present

## 2018-06-12 DIAGNOSIS — N186 End stage renal disease: Secondary | ICD-10-CM | POA: Diagnosis not present

## 2018-06-12 DIAGNOSIS — E1129 Type 2 diabetes mellitus with other diabetic kidney complication: Secondary | ICD-10-CM | POA: Diagnosis not present

## 2018-06-12 DIAGNOSIS — D509 Iron deficiency anemia, unspecified: Secondary | ICD-10-CM | POA: Diagnosis not present

## 2018-06-14 DIAGNOSIS — N186 End stage renal disease: Secondary | ICD-10-CM | POA: Diagnosis not present

## 2018-06-14 DIAGNOSIS — D631 Anemia in chronic kidney disease: Secondary | ICD-10-CM | POA: Diagnosis not present

## 2018-06-14 DIAGNOSIS — N2581 Secondary hyperparathyroidism of renal origin: Secondary | ICD-10-CM | POA: Diagnosis not present

## 2018-06-14 DIAGNOSIS — D509 Iron deficiency anemia, unspecified: Secondary | ICD-10-CM | POA: Diagnosis not present

## 2018-06-14 DIAGNOSIS — E1129 Type 2 diabetes mellitus with other diabetic kidney complication: Secondary | ICD-10-CM | POA: Diagnosis not present

## 2018-06-17 DIAGNOSIS — N2581 Secondary hyperparathyroidism of renal origin: Secondary | ICD-10-CM | POA: Diagnosis not present

## 2018-06-17 DIAGNOSIS — N186 End stage renal disease: Secondary | ICD-10-CM | POA: Diagnosis not present

## 2018-06-17 DIAGNOSIS — E1129 Type 2 diabetes mellitus with other diabetic kidney complication: Secondary | ICD-10-CM | POA: Diagnosis not present

## 2018-06-17 DIAGNOSIS — D509 Iron deficiency anemia, unspecified: Secondary | ICD-10-CM | POA: Diagnosis not present

## 2018-06-17 DIAGNOSIS — D631 Anemia in chronic kidney disease: Secondary | ICD-10-CM | POA: Diagnosis not present

## 2018-06-19 DIAGNOSIS — D509 Iron deficiency anemia, unspecified: Secondary | ICD-10-CM | POA: Diagnosis not present

## 2018-06-19 DIAGNOSIS — E1129 Type 2 diabetes mellitus with other diabetic kidney complication: Secondary | ICD-10-CM | POA: Diagnosis not present

## 2018-06-19 DIAGNOSIS — N2581 Secondary hyperparathyroidism of renal origin: Secondary | ICD-10-CM | POA: Diagnosis not present

## 2018-06-19 DIAGNOSIS — D631 Anemia in chronic kidney disease: Secondary | ICD-10-CM | POA: Diagnosis not present

## 2018-06-19 DIAGNOSIS — N186 End stage renal disease: Secondary | ICD-10-CM | POA: Diagnosis not present

## 2018-06-21 DIAGNOSIS — N2581 Secondary hyperparathyroidism of renal origin: Secondary | ICD-10-CM | POA: Diagnosis not present

## 2018-06-21 DIAGNOSIS — E1129 Type 2 diabetes mellitus with other diabetic kidney complication: Secondary | ICD-10-CM | POA: Diagnosis not present

## 2018-06-21 DIAGNOSIS — D509 Iron deficiency anemia, unspecified: Secondary | ICD-10-CM | POA: Diagnosis not present

## 2018-06-21 DIAGNOSIS — N186 End stage renal disease: Secondary | ICD-10-CM | POA: Diagnosis not present

## 2018-06-21 DIAGNOSIS — D631 Anemia in chronic kidney disease: Secondary | ICD-10-CM | POA: Diagnosis not present

## 2018-06-24 DIAGNOSIS — N2581 Secondary hyperparathyroidism of renal origin: Secondary | ICD-10-CM | POA: Diagnosis not present

## 2018-06-24 DIAGNOSIS — D509 Iron deficiency anemia, unspecified: Secondary | ICD-10-CM | POA: Diagnosis not present

## 2018-06-24 DIAGNOSIS — N186 End stage renal disease: Secondary | ICD-10-CM | POA: Diagnosis not present

## 2018-06-24 DIAGNOSIS — D631 Anemia in chronic kidney disease: Secondary | ICD-10-CM | POA: Diagnosis not present

## 2018-06-24 DIAGNOSIS — E1129 Type 2 diabetes mellitus with other diabetic kidney complication: Secondary | ICD-10-CM | POA: Diagnosis not present

## 2018-06-25 ENCOUNTER — Telehealth: Payer: Self-pay | Admitting: *Deleted

## 2018-06-25 NOTE — Telephone Encounter (Signed)
Current oral antibiotics, Cipro and doxycyline, "not working as well as previous oral antibiotics" per the patient's mother.  Patient has been taking these antibiotics for the past 2 weeks.  Effected area is "bleeding more and increased soreness" per the patient's mother.  Patient's mother asked whether the patient could "either return to the previous oral antibiotics or receive IV antibiotic while at dialysis."  RN shared that this message would be shared with Dr.Van Dam for a response.  Patient receives dialysis on Monday, Wednesday and Friday.

## 2018-06-26 DIAGNOSIS — N2581 Secondary hyperparathyroidism of renal origin: Secondary | ICD-10-CM | POA: Diagnosis not present

## 2018-06-26 DIAGNOSIS — N186 End stage renal disease: Secondary | ICD-10-CM | POA: Diagnosis not present

## 2018-06-26 DIAGNOSIS — D509 Iron deficiency anemia, unspecified: Secondary | ICD-10-CM | POA: Diagnosis not present

## 2018-06-26 DIAGNOSIS — E1129 Type 2 diabetes mellitus with other diabetic kidney complication: Secondary | ICD-10-CM | POA: Diagnosis not present

## 2018-06-26 DIAGNOSIS — D631 Anemia in chronic kidney disease: Secondary | ICD-10-CM | POA: Diagnosis not present

## 2018-06-27 ENCOUNTER — Encounter: Payer: Self-pay | Admitting: Cardiology

## 2018-06-27 ENCOUNTER — Ambulatory Visit (INDEPENDENT_AMBULATORY_CARE_PROVIDER_SITE_OTHER): Payer: Medicare Other | Admitting: Cardiology

## 2018-06-27 VITALS — BP 124/62 | HR 98 | Ht 67.0 in | Wt 300.0 lb

## 2018-06-27 DIAGNOSIS — I5032 Chronic diastolic (congestive) heart failure: Secondary | ICD-10-CM

## 2018-06-27 DIAGNOSIS — I10 Essential (primary) hypertension: Secondary | ICD-10-CM

## 2018-06-27 DIAGNOSIS — E1129 Type 2 diabetes mellitus with other diabetic kidney complication: Secondary | ICD-10-CM | POA: Diagnosis not present

## 2018-06-27 DIAGNOSIS — I951 Orthostatic hypotension: Secondary | ICD-10-CM | POA: Diagnosis not present

## 2018-06-27 DIAGNOSIS — I420 Dilated cardiomyopathy: Secondary | ICD-10-CM | POA: Diagnosis not present

## 2018-06-27 DIAGNOSIS — Z992 Dependence on renal dialysis: Secondary | ICD-10-CM

## 2018-06-27 DIAGNOSIS — I481 Persistent atrial fibrillation: Secondary | ICD-10-CM | POA: Diagnosis not present

## 2018-06-27 DIAGNOSIS — I4819 Other persistent atrial fibrillation: Secondary | ICD-10-CM

## 2018-06-27 DIAGNOSIS — N186 End stage renal disease: Secondary | ICD-10-CM | POA: Diagnosis not present

## 2018-06-27 HISTORY — DX: Orthostatic hypotension: I95.1

## 2018-06-27 NOTE — Telephone Encounter (Signed)
Lets stop her oral antibiotics and try IV ceftazidime and IV vancomycin with HD x  4weeks

## 2018-06-27 NOTE — Progress Notes (Signed)
Cardiology Office Note:    Date:  06/27/2018   ID:  [name], DOB 14-Mar-1961, MRN [id number]  PCP:  Harlan Stains, MD  Cardiologist:  No primary care provider on file.    Referring MD: Harlan Stains, MD   Chief Complaint  Patient presents with  . Cardiomyopathy  . Congestive Heart Failure  . Atrial Fibrillation  . Hypertension    History of Present Illness:    [name] is a 57 y.o. female with a hx of h/o ESRD on dilaysis, dilated cardiomyopathy (now with normalized EF at 50-55%), DM, morbid obesity, chronic diastolic HF,chronically low BP's, chronic shortness of breath with COPD with 02 at night and  hydradenitis suppurativa.  She also has a history of paroxysmal atrial fibrillation followed in A. fib clinic.  She is not anticoagulated due to open skin lesion secondary to hidradenitis on her groin and buttocks which cause constant bleeding and oozing.  She takes Cardizem 30 mg 4 times daily as needed for breakthrough palpitations as she cannot be on long-acting calcium channel blocker or beta-blocker due to issues with hypotension during hemodialysis.  She is here today for followup and is doing well.  She denies any chest pain or pressure,  PND, orthopnea, LE edema, dizziness or syncope.  Occasionally she will have some palpitations but they are fairly short-lived and resolve after 10 minutes.  She will on occasion use a short acting Cardizem tablet to terminate the A. fib.  She has chronic shortness of breath that is controlled on oxygen.  This is not changed any since I saw her last.  She is compliant with her meds and is tolerating meds with no SE.      Past Medical History:  Diagnosis Date  . Chronic diastolic CHF (congestive heart failure) ([place])   . Chronic kidney disease (CKD), stage III (moderate) ([place])    [name] nephrology lov note 05-12-2013 on pt chart now on HD  . DCM (dilated cardiomyopathy) ([place])    nonischemic - EF now 60-65% by echo 2017  . Depression   .  DM type 2 (diabetes mellitus, type 2) (H[facility])    diet controlledwith retinopathy and nephropathy  . ESRD on hemodialysis ([place]) 08/21/2017  . GERD (gastroesophageal reflux disease)   . H/O cardiac arrest 10/2012  . Hard of hearing 10/2012   now wears 2 hearing aids  . History of hemodialysis dec 2013  . HTN (hypertension)   . Hydradenitis   . Hyperkalemia   . Hyperlipidemia   . Hypothyroidism   . Iron deficiency anemia   . Morbid obesity ([place])   . Multinodular goiter   . Neuropathic pain 08/21/2017  . Orthostatic hypotension 06/27/2018  . Pneumonia Nov 10, 2012  . Sleep apnea    Intolerant to CPAP  . Tinnitus 12/26/2016  . Typical atrial flutter ([place]) 04/10/2017  . Urinary tract infection    taking antibiotics for 3 days prior to surgery    Past Surgical History:  Procedure Laterality Date  . AV FISTULA PLACEMENT Left 07/25/2013   Procedure: ARTERIOVENOUS (AV) FISTULA CREATION ;  Surgeon: Rosetta Posner, MD;  Location: [place];  Service: Vascular;  Laterality: Left;  . CYSTOSCOPY W/ RETROGRADES  11/16/2012   Procedure: CYSTOSCOPY WITH RETROGRADE PYELOGRAM;  Surgeon: Reece Packer, MD;  [facility];  Service: Urology;  Laterality: Bilateral;  CYSTOSCOPY,BILATERAL RETROGRADE PYELOGRAM/ REMOVAL LEFT URETERAL STENT/ FULGERATION BLADDER MUCOSA/ INSERTION RIGHT URETERAL STENT  . CYSTOSCOPY W/ URETERAL STENT PLACEMENT  05/28/2012  Procedure: CYSTOSCOPY WITH RETROGRADE PYELOGRAM/URETERAL STENT PLACEMENT;  Surgeon: Reece Packer, MD;  [facility];  Service: Urology;  Laterality: Left;  . CYSTOSCOPY WITH RETROGRADE PYELOGRAM, URETEROSCOPY AND STENT PLACEMENT Right 06/23/2013   Procedure: CYSTOSCOPY WITH RIGHT URETEROSCOPY, RIGHT  RETROGRADE PYELOGRAM, WITH LASER LIPOTRIPSY AND RIGHT URETERAL STENT EXCHANGE ;  Surgeon: Molli Hazard, MD;  [facility];  Service: Urology;  Laterality: Right;  STENT EXCHANGE    . CYSTOSCOPY WITH RETROGRADE PYELOGRAM, URETEROSCOPY AND STENT  PLACEMENT Right 11/06/2013   Procedure: CYSTOSCOPY WITH RETROGRADE PYELOGRAM, URETEROSCOPY STONE REMOVAL AND STENT REMOVAL;  Surgeon: Molli Hazard, MD;  [facility];  Service: Urology;  Laterality: Right;  . HOLMIUM LASER APPLICATION N/A 6/75/9163   Procedure: HOLMIUM LASER APPLICATION;  Surgeon: Molli Hazard, MD;  [facility];  Service: Urology;  Laterality: N/A;  . INSERTION OF DIALYSIS CATHETER N/A 09/24/2013   Procedure: INSERTION OF DIALYSIS CATHETER;  Surgeon: Rosetta Posner, MD;  Location: [facility] OR;  Service: Vascular;  Laterality: N/A;  . IR AV DIALY SHUNT INTRO NEEDLE/INTRACATH INITIAL W/PTA/IMG LEFT  04/19/2018  . IR US GUIDE VASC ACCESS LEFT  04/19/2018  . PORT-A-CATH REMOVAL    . PORTACATH PLACEMENT    . REVISON OF ARTERIOVENOUS FISTULA Left 07/25/2016   Procedure: REVISON OF LEFT ARTERIOVENOUS FISTULA;  Surgeon: Elam Dutch, MD;  Location: [facility] OR;  Service: Vascular;  Laterality: Left;    Current Medications: Current Meds  Medication Sig  . aspirin EC 81 MG tablet Take 81 mg by mouth at bedtime.   . Calcium Acetate 667 MG TABS Take 667 mg by mouth 3 (three) times daily with meals. And snacks  . ciprofloxacin (CIPRO) 500 MG tablet Take 1 tablet (500 mg total) by mouth daily.  Marland Kitchen diltiazem (CARDIZEM) 30 MG tablet Take 1 tablet every 4 hours AS NEEDED for rapid heart rate lasting more than 30 mins as long as top blood pressure >100.  Marland Kitchen doxycycline (VIBRA-TABS) 100 MG tablet Take 1 tablet (100 mg total) by mouth 2 (two) times daily.  . DULoxetine (CYMBALTA) 60 MG capsule Take 1 capsule (60 mg total) by mouth daily.  Marland Kitchen DYMISTA 137-50 MCG/ACT SUSP PLACE 2 SPRAYS INTO THE NOSE DAILY.  . famotidine (PEPCID) 20 MG tablet TAKE 1 TABLET BY MOUTH AT BEDTIME (Patient taking differently: Take 20 mg by mouth at bedtime)  . feeding supplement, RESOURCE BREEZE, (RESOURCE BREEZE) LIQD Take 2 Containers by mouth 2 (two) times daily between meals.   Marland Kitchen glipiZIDE (GLUCOTROL) 5  MG tablet Take 0.5 tablets (2.5 mg total) by mouth daily.  Marland Kitchen levothyroxine (SYNTHROID, LEVOTHROID) 75 MCG tablet Take 75 mcg by mouth daily.  . midodrine (PROAMATINE) 10 MG tablet Take 1 tablet (10 mg total) by mouth 3 (three) times daily. (Patient taking differently: Take 10 mg by mouth every Monday, Wednesday, and Friday. Dialysis days)  . multivitamin (RENA-VIT) TABS tablet Take 1 tablet by mouth daily.  Marland Kitchen oxyCODONE-acetaminophen (PERCOCET/ROXICET) 5-325 MG tablet Take 1-2 tablets by mouth every 6 (six) hours as needed. Patient has not taken since started dialysis 08/2013 due to causes hypotension (Patient taking differently: Take 1-2 tablets by mouth every 6 (six) hours as needed for moderate pain. )  . pravastatin (PRAVACHOL) 40 MG tablet Take 40 mg by mouth daily. Patient takes in am  . Probiotic Product (PROBIOTIC DAILY PO) Take 1 tablet by mouth daily.   . sevelamer carbonate (RENVELA) 800 MG tablet Take 2,400 mg by mouth 3 (  three) times daily with meals.  . sucroferric oxyhydroxide (VELPHORO) 500 MG chewable tablet Chew 500 mg by mouth 3 (three) times daily with meals.     Allergies:   Rosiglitazone; Amoxicillin; and Amoxicillin-pot clavulanate   Social History   Socioeconomic History  . Marital status: Married    Spouse name: Not on file  . Number of children: Not on file  . Years of education: Not on file  . Highest education level: Not on file  Occupational History  . Not on file  Social Needs  . Financial resource strain: Not on file  . Food insecurity:    Worry: Not on file    Inability: Not on file  . Transportation needs:    Medical: Not on file    Non-medical: Not on file  Tobacco Use  . Smoking status: Former Smoker    Packs/day: 0.25    Years: 1.00    Pack years: 0.25    Types: Cigarettes    Last attempt to quit: 11/28/1979    Years since quitting: 38.6  . Smokeless tobacco: Never Used  Substance and Sexual Activity  . Alcohol use: No  . Drug use: No  .  Sexual activity: Never    Birth control/protection: Abstinence  Lifestyle  . Physical activity:    Days per week: Not on file    Minutes per session: Not on file  . Stress: Not on file  Relationships  . Social connections:    Talks on phone: Not on file    Gets together: Not on file    Attends religious service: Not on file    Active member of club or organization: Not on file    Attends meetings of clubs or organizations: Not on file    Relationship status: Not on file  Other Topics Concern  . Not on file  Social History Narrative  . Not on file     Family History: The patient's family history includes Cancer in her maternal grandfather; Coronary artery disease in her father and mother; Diabetes type II in her mother; Hypertension in her father and mother; Kidney failure in her maternal grandmother; Malignant hyperthermia in her father.  ROS:   Please see the history of present illness.    ROS  All other systems reviewed and negative.   EKGs/Labs/Other Studies Reviewed:    The following studies were reviewed today: none  EKG:  EKG is  ordered today.  The ekg ordered today demonstrates NSR with sinus arrhythmia  Recent Labs: No results found for requested labs within last 8760 hours.   Recent Lipid Panel    Component Value Date/Time   CHOL 80 01/20/2012 1639   TRIG 55 01/20/2012 1639   HDL 28 (L) 01/20/2012 1639   CHOLHDL 2.9 01/20/2012 1639   VLDL 11 01/20/2012 1639   LDLCALC 41 01/20/2012 1639    Physical Exam:    VS:  BP 124/62 (BP Location: Right Arm, Patient Position: Sitting, Cuff Size: Large)   Pulse 98   Ht 5\' 7"  (1.702 m)   Wt 300 lb (136.1 kg) Comment: pt stated weight-would not weigh  LMP 06/13/2013   SpO2 96%   BMI 46.99 kg/m     Wt Readings from Last 3 Encounters:  06/27/18 300 lb (136.1 kg)  04/10/17 (!) 380 lb (172.4 kg)  03/08/17 (!) 339 lb 3.2 oz (153.9 kg)     GEN:  Well nourished, well developed in no acute distress HEENT:  Normal NECK: No JVD;  No carotid bruits LYMPHATICS: No lymphadenopathy CARDIAC: RRR, no murmurs, rubs, gallops RESPIRATORY:  Clear to auscultation without rales, wheezing or rhonchi  ABDOMEN: Soft, non-tender, non-distended MUSCULOSKELETAL:  No edema; No deformity  SKIN: Warm and dry NEUROLOGIC:  Alert and oriented x 3 PSYCHIATRIC:  Normal affect   ASSESSMENT:    1. Chronic diastolic heart failure ([place])   2. DCM (dilated cardiomyopathy) ([place])   3. Essential hypertension   4. Persistent atrial fibrillation ([place])   5. Orthostatic hypotension   6. ESRD (end stage renal disease) on dialysis [facility])    PLAN:    In order of problems listed above:  1.  Chronic diastolic heart failure -she appears euvolemic on exam.  Her weight is stable.  She has chronic shortness of breath secondary to her COPD that has not changed any since I saw her last.  Volume is controlled by dialysis.  2.  Dilated cardiomyopathy - last assessment of EF was 60 to 65% by 2D echocardiogram 08/2016.  3.  Hypertension -BP is well controlled on exam today.  Shee has not required any antihypertensive medications.  4.  Persistent atrial fibrillation -she is maintaining normal sinus rhythm on exam.  She will continue to take Cardizem short-acting as needed as needed for palpitations.  She is not on long-term and coagulation due to her significant superative hidradenitis which causes constant oozing and bleeding of her buttocks.  5.  Orthostatic hypotension -is mainly a problem during hemodialysis and is controlled with Midrin 10 mg every Monday Wednesday and Friday with dialysis.  6.  ESRD -on hemodialysis followed by nephrology   Medication Adjustments/Labs and Tests Ordered: Current medicines are reviewed at length with the patient today.  Concerns regarding medicines are outlined above.  Orders Placed This Encounter  Procedures  . EKG 12-Lead   No orders of the defined types were placed in this  encounter.   Signed, Fransico Him, MD  06/27/2018 2:05 PM    [place]

## 2018-06-27 NOTE — Patient Instructions (Signed)
Your physician recommends that you continue on your current medications as directed. Please refer to the Current Medication list given to you today.  Your physician wants you to follow-up in: 6 months with physician extender (PA/NP).  You will receive a reminder letter in the mail two months in advance. If you don't receive a letter, please call our office to schedule the follow-up appointment.  Your physician wants you to follow-up in: 12 months with Dr. Radford Pax.  You will receive a reminder letter in the mail two months in advance. If you don't receive a letter, please call our office to schedule the follow-up appointment.

## 2018-06-28 ENCOUNTER — Other Ambulatory Visit: Payer: Self-pay | Admitting: Pulmonary Disease

## 2018-06-28 DIAGNOSIS — N186 End stage renal disease: Secondary | ICD-10-CM | POA: Diagnosis not present

## 2018-06-28 DIAGNOSIS — D509 Iron deficiency anemia, unspecified: Secondary | ICD-10-CM | POA: Diagnosis not present

## 2018-06-28 DIAGNOSIS — L732 Hidradenitis suppurativa: Secondary | ICD-10-CM | POA: Diagnosis not present

## 2018-06-28 DIAGNOSIS — E1129 Type 2 diabetes mellitus with other diabetic kidney complication: Secondary | ICD-10-CM | POA: Diagnosis not present

## 2018-06-28 DIAGNOSIS — D631 Anemia in chronic kidney disease: Secondary | ICD-10-CM | POA: Diagnosis not present

## 2018-06-28 DIAGNOSIS — N2581 Secondary hyperparathyroidism of renal origin: Secondary | ICD-10-CM | POA: Diagnosis not present

## 2018-07-01 DIAGNOSIS — E1129 Type 2 diabetes mellitus with other diabetic kidney complication: Secondary | ICD-10-CM | POA: Diagnosis not present

## 2018-07-01 DIAGNOSIS — D631 Anemia in chronic kidney disease: Secondary | ICD-10-CM | POA: Diagnosis not present

## 2018-07-01 DIAGNOSIS — L732 Hidradenitis suppurativa: Secondary | ICD-10-CM | POA: Diagnosis not present

## 2018-07-01 DIAGNOSIS — D509 Iron deficiency anemia, unspecified: Secondary | ICD-10-CM | POA: Diagnosis not present

## 2018-07-01 DIAGNOSIS — N186 End stage renal disease: Secondary | ICD-10-CM | POA: Diagnosis not present

## 2018-07-01 DIAGNOSIS — N2581 Secondary hyperparathyroidism of renal origin: Secondary | ICD-10-CM | POA: Diagnosis not present

## 2018-07-02 NOTE — Telephone Encounter (Signed)
Patient will need ceftazidime 2 gm IV after each dialysis session and vancomycin 1gm IV after each dialysis session.

## 2018-07-02 NOTE — Telephone Encounter (Signed)
Order faxed to Dialysis center at [phone] and patient notified. Order signed by Terri Piedra NP for Dr Tommy Medal.

## 2018-07-03 DIAGNOSIS — D509 Iron deficiency anemia, unspecified: Secondary | ICD-10-CM | POA: Diagnosis not present

## 2018-07-03 DIAGNOSIS — N186 End stage renal disease: Secondary | ICD-10-CM | POA: Diagnosis not present

## 2018-07-03 DIAGNOSIS — L732 Hidradenitis suppurativa: Secondary | ICD-10-CM | POA: Diagnosis not present

## 2018-07-03 DIAGNOSIS — D631 Anemia in chronic kidney disease: Secondary | ICD-10-CM | POA: Diagnosis not present

## 2018-07-03 DIAGNOSIS — N2581 Secondary hyperparathyroidism of renal origin: Secondary | ICD-10-CM | POA: Diagnosis not present

## 2018-07-03 DIAGNOSIS — E1129 Type 2 diabetes mellitus with other diabetic kidney complication: Secondary | ICD-10-CM | POA: Diagnosis not present

## 2018-07-03 NOTE — Telephone Encounter (Signed)
Thanks so much. 

## 2018-07-05 DIAGNOSIS — D631 Anemia in chronic kidney disease: Secondary | ICD-10-CM | POA: Diagnosis not present

## 2018-07-05 DIAGNOSIS — D509 Iron deficiency anemia, unspecified: Secondary | ICD-10-CM | POA: Diagnosis not present

## 2018-07-05 DIAGNOSIS — N2581 Secondary hyperparathyroidism of renal origin: Secondary | ICD-10-CM | POA: Diagnosis not present

## 2018-07-05 DIAGNOSIS — E1129 Type 2 diabetes mellitus with other diabetic kidney complication: Secondary | ICD-10-CM | POA: Diagnosis not present

## 2018-07-05 DIAGNOSIS — L732 Hidradenitis suppurativa: Secondary | ICD-10-CM | POA: Diagnosis not present

## 2018-07-05 DIAGNOSIS — N186 End stage renal disease: Secondary | ICD-10-CM | POA: Diagnosis not present

## 2018-07-08 DIAGNOSIS — N2581 Secondary hyperparathyroidism of renal origin: Secondary | ICD-10-CM | POA: Diagnosis not present

## 2018-07-08 DIAGNOSIS — E1129 Type 2 diabetes mellitus with other diabetic kidney complication: Secondary | ICD-10-CM | POA: Diagnosis not present

## 2018-07-08 DIAGNOSIS — N186 End stage renal disease: Secondary | ICD-10-CM | POA: Diagnosis not present

## 2018-07-08 DIAGNOSIS — D631 Anemia in chronic kidney disease: Secondary | ICD-10-CM | POA: Diagnosis not present

## 2018-07-08 DIAGNOSIS — D509 Iron deficiency anemia, unspecified: Secondary | ICD-10-CM | POA: Diagnosis not present

## 2018-07-08 DIAGNOSIS — L732 Hidradenitis suppurativa: Secondary | ICD-10-CM | POA: Diagnosis not present

## 2018-07-10 DIAGNOSIS — N2581 Secondary hyperparathyroidism of renal origin: Secondary | ICD-10-CM | POA: Diagnosis not present

## 2018-07-10 DIAGNOSIS — E1129 Type 2 diabetes mellitus with other diabetic kidney complication: Secondary | ICD-10-CM | POA: Diagnosis not present

## 2018-07-10 DIAGNOSIS — L732 Hidradenitis suppurativa: Secondary | ICD-10-CM | POA: Diagnosis not present

## 2018-07-10 DIAGNOSIS — D509 Iron deficiency anemia, unspecified: Secondary | ICD-10-CM | POA: Diagnosis not present

## 2018-07-10 DIAGNOSIS — N186 End stage renal disease: Secondary | ICD-10-CM | POA: Diagnosis not present

## 2018-07-10 DIAGNOSIS — D631 Anemia in chronic kidney disease: Secondary | ICD-10-CM | POA: Diagnosis not present

## 2018-07-10 DIAGNOSIS — A491 Streptococcal infection, unspecified site: Secondary | ICD-10-CM | POA: Diagnosis not present

## 2018-07-12 DIAGNOSIS — D509 Iron deficiency anemia, unspecified: Secondary | ICD-10-CM | POA: Diagnosis not present

## 2018-07-12 DIAGNOSIS — D631 Anemia in chronic kidney disease: Secondary | ICD-10-CM | POA: Diagnosis not present

## 2018-07-12 DIAGNOSIS — L732 Hidradenitis suppurativa: Secondary | ICD-10-CM | POA: Diagnosis not present

## 2018-07-12 DIAGNOSIS — E1129 Type 2 diabetes mellitus with other diabetic kidney complication: Secondary | ICD-10-CM | POA: Diagnosis not present

## 2018-07-12 DIAGNOSIS — N2581 Secondary hyperparathyroidism of renal origin: Secondary | ICD-10-CM | POA: Diagnosis not present

## 2018-07-12 DIAGNOSIS — N186 End stage renal disease: Secondary | ICD-10-CM | POA: Diagnosis not present

## 2018-07-15 DIAGNOSIS — D631 Anemia in chronic kidney disease: Secondary | ICD-10-CM | POA: Diagnosis not present

## 2018-07-15 DIAGNOSIS — E1129 Type 2 diabetes mellitus with other diabetic kidney complication: Secondary | ICD-10-CM | POA: Diagnosis not present

## 2018-07-15 DIAGNOSIS — D509 Iron deficiency anemia, unspecified: Secondary | ICD-10-CM | POA: Diagnosis not present

## 2018-07-15 DIAGNOSIS — L732 Hidradenitis suppurativa: Secondary | ICD-10-CM | POA: Diagnosis not present

## 2018-07-15 DIAGNOSIS — N2581 Secondary hyperparathyroidism of renal origin: Secondary | ICD-10-CM | POA: Diagnosis not present

## 2018-07-15 DIAGNOSIS — N186 End stage renal disease: Secondary | ICD-10-CM | POA: Diagnosis not present

## 2018-07-16 ENCOUNTER — Encounter: Payer: Self-pay | Admitting: Family

## 2018-07-16 ENCOUNTER — Ambulatory Visit (INDEPENDENT_AMBULATORY_CARE_PROVIDER_SITE_OTHER): Payer: Medicare Other | Admitting: Family

## 2018-07-16 VITALS — BP 123/79 | HR 97 | Temp 97.9°F | Resp 16

## 2018-07-16 DIAGNOSIS — L732 Hidradenitis suppurativa: Secondary | ICD-10-CM

## 2018-07-16 NOTE — Assessment & Plan Note (Signed)
Ms. Royse continues to have wounds/lesions from hidradenitis and per patient report and pictures appear to be improving. These wounds are likely going to have complicated healing with her co-morbidities. She does follow with wound care. Discussed not applying warming to bleeding areas as this may increase the bleeding. Also emphasized the importance of off loading the area to allow I to heal similar to pressure ulcer. I do think that she is getting better as she on a broad spectrum with vancomycin and ceftazidime. Recommend continuing the ceftazidime and vancomycin for the remaining week. Mother will call to give an update to determine if we need to continue IV antibiotics or possibly switch back to oral antibiotics. Follow up office visit pending completion of treatment with IV antibiotics.

## 2018-07-16 NOTE — Patient Instructions (Signed)
Nice to meet you.  We will continue the Ceftazidime and Vancomycin as previously prescribed.  Work on taking pressure off the area.  Avoid the heat for a night or two and see if there is less bleeding.  Call next week with an update and we will decide continued plan of care.

## 2018-07-16 NOTE — Progress Notes (Signed)
Subjective:    Patient ID: [name], female    DOB: Jul 09, 1961, 57 y.o.   MRN: [id number]  Chief Complaint  Patient presents with  . Hidranentitis     HPI:  [name] is a 57 y.o. female who presents today for a follow up office visit.   [name] was last seen in the office on 05/28/18 where she had improvements in her Hidradenitis with metronidazole and doxycycline. She had an additional new area around her labia that was more swollen and painful. She was switched to doxycycline and ciprofloxacin. On 7/30 a phone note from [name]'s mother noted worsening of her symptoms since starting the ciprofloxacin. She was thus changed to IV vancomycin and ceftazidime with hemodialysis for 4 weeks.   [name] has been receiving her vancomycin and ceftazidime with dialysis as prescribed with no adverse side effects. She has completed about 3 weeks of therapy to this point. Her mother has concerns because there is more bleeding than previous noted, however denies fevers, chills, odor, or purulent drainage. [name] does tend to stay leaning on that particular side and has been using warm bottles which helps to relive the discomfort. She does report in the last few days that she is able to stand and her symptoms feel like they are improving.    Allergies  Allergen Reactions  . Rosiglitazone Other (See Comments)  . Amoxicillin Rash and Other (See Comments)    Tolerated Zosyn 01/2013. [place]  . Amoxicillin-Pot Clavulanate Diarrhea     Outpatient Medications Prior to Visit  Medication Sig Dispense Refill  . aspirin EC 81 MG tablet Take 81 mg by mouth at bedtime.     . Calcium Acetate 667 MG TABS Take 667 mg by mouth 3 (three) times daily with meals. And snacks    . diltiazem (CARDIZEM) 30 MG tablet Take 1 tablet every 4 hours AS NEEDED for rapid heart rate lasting more than 30 mins as long as top blood pressure >100. 30 tablet 2  . DULoxetine (CYMBALTA) 60 MG capsule Take 1 capsule (60  mg total) by mouth daily. 30 capsule 0  . DYMISTA 137-50 MCG/ACT SUSP PLACE 2 SPRAYS INTO THE NOSE DAILY. 1 Bottle 1  . famotidine (PEPCID) 20 MG tablet TAKE 1 TABLET BY MOUTH AT BEDTIME (Patient taking differently: Take 20 mg by mouth at bedtime) 30 tablet 0  . feeding supplement, RESOURCE BREEZE, (RESOURCE BREEZE) LIQD Take 2 Containers by mouth 2 (two) times daily between meals.     Marland Kitchen glipiZIDE (GLUCOTROL) 5 MG tablet Take 0.5 tablets (2.5 mg total) by mouth daily. 30 tablet 0  . levothyroxine (SYNTHROID, LEVOTHROID) 75 MCG tablet Take 75 mcg by mouth daily.  0  . midodrine (PROAMATINE) 10 MG tablet Take 1 tablet (10 mg total) by mouth 3 (three) times daily. (Patient taking differently: Take 10 mg by mouth every Monday, Wednesday, and Friday. Dialysis days) 90 tablet 0  . multivitamin (RENA-VIT) TABS tablet Take 1 tablet by mouth daily.  6  . oxyCODONE-acetaminophen (PERCOCET/ROXICET) 5-325 MG tablet Take 1-2 tablets by mouth every 6 (six) hours as needed. Patient has not taken since started dialysis 08/2013 due to causes hypotension (Patient taking differently: Take 1-2 tablets by mouth every 6 (six) hours as needed for moderate pain. ) 6 tablet 0  . pravastatin (PRAVACHOL) 40 MG tablet Take 40 mg by mouth daily. Patient takes in am    . Probiotic Product (PROBIOTIC DAILY PO) Take 1 tablet by  mouth daily.     . sevelamer carbonate (RENVELA) 800 MG tablet Take 2,400 mg by mouth 3 (three) times daily with meals.    . sucroferric oxyhydroxide (VELPHORO) 500 MG chewable tablet Chew 500 mg by mouth 3 (three) times daily with meals.    . ciprofloxacin (CIPRO) 500 MG tablet Take 1 tablet (500 mg total) by mouth daily. (Patient not taking: Reported on 07/16/2018) 30 tablet 1  . doxycycline (VIBRA-TABS) 100 MG tablet Take 1 tablet (100 mg total) by mouth 2 (two) times daily. (Patient not taking: Reported on 07/16/2018) 60 tablet 1   No facility-administered medications prior to visit.      Past Medical  History:  Diagnosis Date  . Chronic diastolic CHF (congestive heart failure) ([place])   . Chronic kidney disease (CKD), stage III (moderate) ([place])    [name] nephrology lov note 05-12-2013 on pt chart now on HD  . DCM (dilated cardiomyopathy) ([place])    nonischemic - EF now 60-65% by echo 2017  . Depression   . DM type 2 (diabetes mellitus, type 2) ([facility])    diet controlledwith retinopathy and nephropathy  . ESRD on hemodialysis ([place]) 08/21/2017  . GERD (gastroesophageal reflux disease)   . H/O cardiac arrest 10/2012  . Hard of hearing 10/2012   now wears 2 hearing aids  . History of hemodialysis dec 2013  . HTN (hypertension)   . Hydradenitis   . Hyperkalemia   . Hyperlipidemia   . Hypothyroidism   . Iron deficiency anemia   . Morbid obesity ([place])   . Multinodular goiter   . Neuropathic pain 08/21/2017  . Orthostatic hypotension 06/27/2018  . Pneumonia Nov 10, 2012  . Sleep apnea    Intolerant to CPAP  . Tinnitus 12/26/2016  . Typical atrial flutter ([place]) 04/10/2017  . Urinary tract infection    taking antibiotics for 3 days prior to surgery     Past Surgical History:  Procedure Laterality Date  . AV FISTULA PLACEMENT Left 07/25/2013   Procedure: ARTERIOVENOUS (AV) FISTULA CREATION ;  Surgeon: Rosetta Posner, MD;  Location: [place];  Service: Vascular;  Laterality: Left;  . CYSTOSCOPY W/ RETROGRADES  11/16/2012   Procedure: CYSTOSCOPY WITH RETROGRADE PYELOGRAM;  Surgeon: Reece Packer, MD;  [facility];  Service: Urology;  Laterality: Bilateral;  CYSTOSCOPY,BILATERAL RETROGRADE PYELOGRAM/ REMOVAL LEFT URETERAL STENT/ FULGERATION BLADDER MUCOSA/ INSERTION RIGHT URETERAL STENT  . CYSTOSCOPY W/ URETERAL STENT PLACEMENT  05/28/2012   Procedure: CYSTOSCOPY WITH RETROGRADE PYELOGRAM/URETERAL STENT PLACEMENT;  Surgeon: Reece Packer, MD;  [facility];  Service: Urology;  Laterality: Left;  . CYSTOSCOPY WITH RETROGRADE PYELOGRAM, URETEROSCOPY AND STENT PLACEMENT Right 06/23/2013    Procedure: CYSTOSCOPY WITH RIGHT URETEROSCOPY, RIGHT  RETROGRADE PYELOGRAM, WITH LASER LIPOTRIPSY AND RIGHT URETERAL STENT EXCHANGE ;  Surgeon: Molli Hazard, MD;  [facility];  Service: Urology;  Laterality: Right;  STENT EXCHANGE    . CYSTOSCOPY WITH RETROGRADE PYELOGRAM, URETEROSCOPY AND STENT PLACEMENT Right 11/06/2013   Procedure: CYSTOSCOPY WITH RETROGRADE PYELOGRAM, URETEROSCOPY STONE REMOVAL AND STENT REMOVAL;  Surgeon: Molli Hazard, MD;  [facility];  Service: Urology;  Laterality: Right;  . HOLMIUM LASER APPLICATION N/A 8/65/7846   Procedure: HOLMIUM LASER APPLICATION;  Surgeon: Molli Hazard, MD;  [facility];  Service: Urology;  Laterality: N/A;  . INSERTION OF DIALYSIS CATHETER N/A 09/24/2013   Procedure: INSERTION OF DIALYSIS CATHETER;  Surgeon: Rosetta Posner, MD;  Location: [place];  Service: Vascular;  Laterality: N/A;  . IR AV DIALY SHUNT INTRO NEEDLE/INTRACATH INITIAL W/PTA/IMG LEFT  04/19/2018  . IR US GUIDE VASC ACCESS LEFT  04/19/2018  . PORT-A-CATH REMOVAL    . PORTACATH PLACEMENT    . REVISON OF ARTERIOVENOUS FISTULA Left 07/25/2016   Procedure: REVISON OF LEFT ARTERIOVENOUS FISTULA;  Surgeon: Elam Dutch, MD;  Location: [facility];  Service: Vascular;  Laterality: Left;     Review of Systems  Constitutional: Negative for chills, fever and unexpected weight change.  Respiratory: Negative for cough, chest tightness and shortness of breath.   Cardiovascular: Negative for chest pain.  Skin: Positive for wound.      Objective:    BP 123/79   Pulse 97   Temp 97.9 F (36.6 C)   Resp 16   LMP 06/13/2013   SpO2 91% Comment: ROOM AIR Nursing note and vital signs reviewed.  Physical Exam  Constitutional: She is oriented to person, place, and time. She appears well-developed and well-nourished. She is cooperative. She appears ill. No distress. Nasal cannula in place.  Cardiovascular: Normal rate, regular rhythm, normal heart  sounds and intact distal pulses.  Pulmonary/Chest: Effort normal and breath sounds normal.  Neurological: She is alert and oriented to person, place, and time.  Skin: Skin is warm and dry.  Wounds not examined. Reviewed through pictures provided on her mother's phone. There does not appear to be any significant areas of infection with several open areas.   Psychiatric: She has a normal mood and affect. Her behavior is normal. Judgment and thought content normal.       Assessment & Plan:   Problem List Items Addressed This Visit      Musculoskeletal and Integument   Hidradenitis suppurativa - Primary    Ms. Mattie continues to have wounds/lesions from hidradenitis and per patient report and pictures appear to be improving. These wounds are likely going to have complicated healing with her co-morbidities. She does follow with wound care. Discussed not applying warming to bleeding areas as this may increase the bleeding. Also emphasized the importance of off loading the area to allow I to heal similar to pressure ulcer. I do think that she is getting better as she on a broad spectrum with vancomycin and ceftazidime. Recommend continuing the ceftazidime and vancomycin for the remaining week. Mother will call to give an update to determine if we need to continue IV antibiotics or possibly switch back to oral antibiotics. Follow up office visit pending completion of treatment with IV antibiotics.           I am having [name] maintain her DULoxetine, pravastatin, feeding supplement, Calcium Acetate, famotidine, Probiotic Product (PROBIOTIC DAILY PO), sucroferric oxyhydroxide, aspirin EC, levothyroxine, multivitamin, oxyCODONE-acetaminophen, glipiZIDE, midodrine, sevelamer carbonate, diltiazem, DYMISTA, ciprofloxacin, and doxycycline.   Follow-up: Return in about 1 month (around 08/16/2018).   Terri Piedra, MSN, FNP-C Nurse Practitioner [facility] [place] Office phone: [phone] Pager: [place] number: [phone]

## 2018-07-17 DIAGNOSIS — N2581 Secondary hyperparathyroidism of renal origin: Secondary | ICD-10-CM | POA: Diagnosis not present

## 2018-07-17 DIAGNOSIS — E1129 Type 2 diabetes mellitus with other diabetic kidney complication: Secondary | ICD-10-CM | POA: Diagnosis not present

## 2018-07-17 DIAGNOSIS — N186 End stage renal disease: Secondary | ICD-10-CM | POA: Diagnosis not present

## 2018-07-17 DIAGNOSIS — D509 Iron deficiency anemia, unspecified: Secondary | ICD-10-CM | POA: Diagnosis not present

## 2018-07-17 DIAGNOSIS — A491 Streptococcal infection, unspecified site: Secondary | ICD-10-CM | POA: Diagnosis not present

## 2018-07-17 DIAGNOSIS — L732 Hidradenitis suppurativa: Secondary | ICD-10-CM | POA: Diagnosis not present

## 2018-07-17 DIAGNOSIS — D631 Anemia in chronic kidney disease: Secondary | ICD-10-CM | POA: Diagnosis not present

## 2018-07-19 DIAGNOSIS — E1129 Type 2 diabetes mellitus with other diabetic kidney complication: Secondary | ICD-10-CM | POA: Diagnosis not present

## 2018-07-19 DIAGNOSIS — N2581 Secondary hyperparathyroidism of renal origin: Secondary | ICD-10-CM | POA: Diagnosis not present

## 2018-07-19 DIAGNOSIS — D509 Iron deficiency anemia, unspecified: Secondary | ICD-10-CM | POA: Diagnosis not present

## 2018-07-19 DIAGNOSIS — N186 End stage renal disease: Secondary | ICD-10-CM | POA: Diagnosis not present

## 2018-07-19 DIAGNOSIS — L732 Hidradenitis suppurativa: Secondary | ICD-10-CM | POA: Diagnosis not present

## 2018-07-19 DIAGNOSIS — A491 Streptococcal infection, unspecified site: Secondary | ICD-10-CM | POA: Diagnosis not present

## 2018-07-19 DIAGNOSIS — D631 Anemia in chronic kidney disease: Secondary | ICD-10-CM | POA: Diagnosis not present

## 2018-07-22 DIAGNOSIS — E1129 Type 2 diabetes mellitus with other diabetic kidney complication: Secondary | ICD-10-CM | POA: Diagnosis not present

## 2018-07-22 DIAGNOSIS — N186 End stage renal disease: Secondary | ICD-10-CM | POA: Diagnosis not present

## 2018-07-22 DIAGNOSIS — D631 Anemia in chronic kidney disease: Secondary | ICD-10-CM | POA: Diagnosis not present

## 2018-07-22 DIAGNOSIS — A491 Streptococcal infection, unspecified site: Secondary | ICD-10-CM | POA: Diagnosis not present

## 2018-07-22 DIAGNOSIS — L732 Hidradenitis suppurativa: Secondary | ICD-10-CM | POA: Diagnosis not present

## 2018-07-22 DIAGNOSIS — N2581 Secondary hyperparathyroidism of renal origin: Secondary | ICD-10-CM | POA: Diagnosis not present

## 2018-07-22 DIAGNOSIS — D509 Iron deficiency anemia, unspecified: Secondary | ICD-10-CM | POA: Diagnosis not present

## 2018-07-23 ENCOUNTER — Telehealth: Payer: Self-pay | Admitting: *Deleted

## 2018-07-23 NOTE — Telephone Encounter (Signed)
Patient's mother called, states she feels [name] would do well with 2 additional weeks of antibiotics after dialysis. Please advise. Shelley Gandy, RN

## 2018-07-24 DIAGNOSIS — N186 End stage renal disease: Secondary | ICD-10-CM | POA: Diagnosis not present

## 2018-07-24 DIAGNOSIS — E1129 Type 2 diabetes mellitus with other diabetic kidney complication: Secondary | ICD-10-CM | POA: Diagnosis not present

## 2018-07-24 DIAGNOSIS — D631 Anemia in chronic kidney disease: Secondary | ICD-10-CM | POA: Diagnosis not present

## 2018-07-24 DIAGNOSIS — L732 Hidradenitis suppurativa: Secondary | ICD-10-CM | POA: Diagnosis not present

## 2018-07-24 DIAGNOSIS — D509 Iron deficiency anemia, unspecified: Secondary | ICD-10-CM | POA: Diagnosis not present

## 2018-07-24 DIAGNOSIS — N2581 Secondary hyperparathyroidism of renal origin: Secondary | ICD-10-CM | POA: Diagnosis not present

## 2018-07-24 NOTE — Telephone Encounter (Signed)
Please fax order to [facility] at [phone] to extend [name]'s 2 grams of ceftazidime and 1 gram of vancomycin with HD for 2 weeks with new end date of 08/06/18. Thanks!

## 2018-07-24 NOTE — Telephone Encounter (Signed)
Verbal order relayed to Jolene at [facility], Richarda Blade to extend IV antibiotics through 08/06/18.  Verbal order repeated and verified. Per Jolene, no fax needed this time, as Dr. Jimmy Footman is onsite and aware.  Patient has been attempting to sign off early from dialysis per Jolene. She asked if we could encourage the patient to complete her treatments in entirety.  RN reached out to [place], but she was unavailable. RN spoke with her mother to encourage Dierdra to follow through with then entire dialysis/antibiotic treatment. Her mother stated "I will take care of this" and will speak with Dierdra now. Landis Gandy, RN

## 2018-07-25 NOTE — Telephone Encounter (Signed)
Thank you :)

## 2018-07-26 DIAGNOSIS — N186 End stage renal disease: Secondary | ICD-10-CM | POA: Diagnosis not present

## 2018-07-26 DIAGNOSIS — N2581 Secondary hyperparathyroidism of renal origin: Secondary | ICD-10-CM | POA: Diagnosis not present

## 2018-07-26 DIAGNOSIS — L732 Hidradenitis suppurativa: Secondary | ICD-10-CM | POA: Diagnosis not present

## 2018-07-26 DIAGNOSIS — E1129 Type 2 diabetes mellitus with other diabetic kidney complication: Secondary | ICD-10-CM | POA: Diagnosis not present

## 2018-07-26 DIAGNOSIS — D631 Anemia in chronic kidney disease: Secondary | ICD-10-CM | POA: Diagnosis not present

## 2018-07-26 DIAGNOSIS — D509 Iron deficiency anemia, unspecified: Secondary | ICD-10-CM | POA: Diagnosis not present

## 2018-07-28 DIAGNOSIS — E1129 Type 2 diabetes mellitus with other diabetic kidney complication: Secondary | ICD-10-CM | POA: Diagnosis not present

## 2018-07-28 DIAGNOSIS — Z992 Dependence on renal dialysis: Secondary | ICD-10-CM | POA: Diagnosis not present

## 2018-07-28 DIAGNOSIS — N186 End stage renal disease: Secondary | ICD-10-CM | POA: Diagnosis not present

## 2018-07-29 DIAGNOSIS — N2581 Secondary hyperparathyroidism of renal origin: Secondary | ICD-10-CM | POA: Diagnosis not present

## 2018-07-29 DIAGNOSIS — D631 Anemia in chronic kidney disease: Secondary | ICD-10-CM | POA: Diagnosis not present

## 2018-07-29 DIAGNOSIS — L732 Hidradenitis suppurativa: Secondary | ICD-10-CM | POA: Diagnosis not present

## 2018-07-29 DIAGNOSIS — E1129 Type 2 diabetes mellitus with other diabetic kidney complication: Secondary | ICD-10-CM | POA: Diagnosis not present

## 2018-07-29 DIAGNOSIS — N186 End stage renal disease: Secondary | ICD-10-CM | POA: Diagnosis not present

## 2018-07-29 DIAGNOSIS — D509 Iron deficiency anemia, unspecified: Secondary | ICD-10-CM | POA: Diagnosis not present

## 2018-07-29 DIAGNOSIS — Z23 Encounter for immunization: Secondary | ICD-10-CM | POA: Diagnosis not present

## 2018-07-29 DIAGNOSIS — E8779 Other fluid overload: Secondary | ICD-10-CM | POA: Diagnosis not present

## 2018-07-30 ENCOUNTER — Ambulatory Visit: Payer: Medicare Other | Admitting: Podiatry

## 2018-07-31 DIAGNOSIS — E1129 Type 2 diabetes mellitus with other diabetic kidney complication: Secondary | ICD-10-CM | POA: Diagnosis not present

## 2018-07-31 DIAGNOSIS — N2581 Secondary hyperparathyroidism of renal origin: Secondary | ICD-10-CM | POA: Diagnosis not present

## 2018-07-31 DIAGNOSIS — D509 Iron deficiency anemia, unspecified: Secondary | ICD-10-CM | POA: Diagnosis not present

## 2018-07-31 DIAGNOSIS — D631 Anemia in chronic kidney disease: Secondary | ICD-10-CM | POA: Diagnosis not present

## 2018-07-31 DIAGNOSIS — N186 End stage renal disease: Secondary | ICD-10-CM | POA: Diagnosis not present

## 2018-07-31 DIAGNOSIS — L732 Hidradenitis suppurativa: Secondary | ICD-10-CM | POA: Diagnosis not present

## 2018-08-02 DIAGNOSIS — N186 End stage renal disease: Secondary | ICD-10-CM | POA: Diagnosis not present

## 2018-08-02 DIAGNOSIS — D631 Anemia in chronic kidney disease: Secondary | ICD-10-CM | POA: Diagnosis not present

## 2018-08-02 DIAGNOSIS — N2581 Secondary hyperparathyroidism of renal origin: Secondary | ICD-10-CM | POA: Diagnosis not present

## 2018-08-02 DIAGNOSIS — E1129 Type 2 diabetes mellitus with other diabetic kidney complication: Secondary | ICD-10-CM | POA: Diagnosis not present

## 2018-08-02 DIAGNOSIS — L732 Hidradenitis suppurativa: Secondary | ICD-10-CM | POA: Diagnosis not present

## 2018-08-02 DIAGNOSIS — D509 Iron deficiency anemia, unspecified: Secondary | ICD-10-CM | POA: Diagnosis not present

## 2018-08-03 DIAGNOSIS — D631 Anemia in chronic kidney disease: Secondary | ICD-10-CM | POA: Diagnosis not present

## 2018-08-03 DIAGNOSIS — D509 Iron deficiency anemia, unspecified: Secondary | ICD-10-CM | POA: Diagnosis not present

## 2018-08-03 DIAGNOSIS — N186 End stage renal disease: Secondary | ICD-10-CM | POA: Diagnosis not present

## 2018-08-03 DIAGNOSIS — E1129 Type 2 diabetes mellitus with other diabetic kidney complication: Secondary | ICD-10-CM | POA: Diagnosis not present

## 2018-08-03 DIAGNOSIS — L732 Hidradenitis suppurativa: Secondary | ICD-10-CM | POA: Diagnosis not present

## 2018-08-03 DIAGNOSIS — N2581 Secondary hyperparathyroidism of renal origin: Secondary | ICD-10-CM | POA: Diagnosis not present

## 2018-08-05 DIAGNOSIS — D631 Anemia in chronic kidney disease: Secondary | ICD-10-CM | POA: Diagnosis not present

## 2018-08-05 DIAGNOSIS — N186 End stage renal disease: Secondary | ICD-10-CM | POA: Diagnosis not present

## 2018-08-05 DIAGNOSIS — N2581 Secondary hyperparathyroidism of renal origin: Secondary | ICD-10-CM | POA: Diagnosis not present

## 2018-08-05 DIAGNOSIS — E1129 Type 2 diabetes mellitus with other diabetic kidney complication: Secondary | ICD-10-CM | POA: Diagnosis not present

## 2018-08-05 DIAGNOSIS — L732 Hidradenitis suppurativa: Secondary | ICD-10-CM | POA: Diagnosis not present

## 2018-08-05 DIAGNOSIS — D509 Iron deficiency anemia, unspecified: Secondary | ICD-10-CM | POA: Diagnosis not present

## 2018-08-06 ENCOUNTER — Other Ambulatory Visit (HOSPITAL_COMMUNITY): Payer: Self-pay | Admitting: Nephrology

## 2018-08-06 DIAGNOSIS — N186 End stage renal disease: Secondary | ICD-10-CM

## 2018-08-06 DIAGNOSIS — Z992 Dependence on renal dialysis: Principal | ICD-10-CM

## 2018-08-07 DIAGNOSIS — E1129 Type 2 diabetes mellitus with other diabetic kidney complication: Secondary | ICD-10-CM | POA: Diagnosis not present

## 2018-08-07 DIAGNOSIS — D631 Anemia in chronic kidney disease: Secondary | ICD-10-CM | POA: Diagnosis not present

## 2018-08-07 DIAGNOSIS — L732 Hidradenitis suppurativa: Secondary | ICD-10-CM | POA: Diagnosis not present

## 2018-08-07 DIAGNOSIS — D509 Iron deficiency anemia, unspecified: Secondary | ICD-10-CM | POA: Diagnosis not present

## 2018-08-07 DIAGNOSIS — N186 End stage renal disease: Secondary | ICD-10-CM | POA: Diagnosis not present

## 2018-08-07 DIAGNOSIS — N2581 Secondary hyperparathyroidism of renal origin: Secondary | ICD-10-CM | POA: Diagnosis not present

## 2018-08-09 DIAGNOSIS — D631 Anemia in chronic kidney disease: Secondary | ICD-10-CM | POA: Diagnosis not present

## 2018-08-09 DIAGNOSIS — L732 Hidradenitis suppurativa: Secondary | ICD-10-CM | POA: Diagnosis not present

## 2018-08-09 DIAGNOSIS — N2581 Secondary hyperparathyroidism of renal origin: Secondary | ICD-10-CM | POA: Diagnosis not present

## 2018-08-09 DIAGNOSIS — D509 Iron deficiency anemia, unspecified: Secondary | ICD-10-CM | POA: Diagnosis not present

## 2018-08-09 DIAGNOSIS — N186 End stage renal disease: Secondary | ICD-10-CM | POA: Diagnosis not present

## 2018-08-09 DIAGNOSIS — E1129 Type 2 diabetes mellitus with other diabetic kidney complication: Secondary | ICD-10-CM | POA: Diagnosis not present

## 2018-08-12 ENCOUNTER — Other Ambulatory Visit: Payer: Self-pay | Admitting: Student

## 2018-08-12 DIAGNOSIS — N186 End stage renal disease: Secondary | ICD-10-CM | POA: Diagnosis not present

## 2018-08-12 DIAGNOSIS — D509 Iron deficiency anemia, unspecified: Secondary | ICD-10-CM | POA: Diagnosis not present

## 2018-08-12 DIAGNOSIS — E1129 Type 2 diabetes mellitus with other diabetic kidney complication: Secondary | ICD-10-CM | POA: Diagnosis not present

## 2018-08-12 DIAGNOSIS — L732 Hidradenitis suppurativa: Secondary | ICD-10-CM | POA: Diagnosis not present

## 2018-08-12 DIAGNOSIS — D631 Anemia in chronic kidney disease: Secondary | ICD-10-CM | POA: Diagnosis not present

## 2018-08-12 DIAGNOSIS — N2581 Secondary hyperparathyroidism of renal origin: Secondary | ICD-10-CM | POA: Diagnosis not present

## 2018-08-13 ENCOUNTER — Other Ambulatory Visit (HOSPITAL_COMMUNITY): Payer: Self-pay | Admitting: Nephrology

## 2018-08-13 ENCOUNTER — Ambulatory Visit (HOSPITAL_COMMUNITY)
Admission: RE | Admit: 2018-08-13 | Discharge: 2018-08-13 | Disposition: A | Discharge status: Home / Self Care | Payer: Medicare Other | Source: Ambulatory Visit | Attending: Nephrology | Admitting: Nephrology

## 2018-08-13 ENCOUNTER — Encounter (HOSPITAL_COMMUNITY): Payer: Self-pay

## 2018-08-13 DIAGNOSIS — Z7982 Long term (current) use of aspirin: Secondary | ICD-10-CM | POA: Insufficient documentation

## 2018-08-13 DIAGNOSIS — Y832 Surgical operation with anastomosis, bypass or graft as the cause of abnormal reaction of the patient, or of later complication, without mention of misadventure at the time of the procedure: Secondary | ICD-10-CM | POA: Diagnosis not present

## 2018-08-13 DIAGNOSIS — F329 Major depressive disorder, single episode, unspecified: Secondary | ICD-10-CM | POA: Insufficient documentation

## 2018-08-13 DIAGNOSIS — N186 End stage renal disease: Secondary | ICD-10-CM | POA: Diagnosis not present

## 2018-08-13 DIAGNOSIS — I420 Dilated cardiomyopathy: Secondary | ICD-10-CM | POA: Diagnosis not present

## 2018-08-13 DIAGNOSIS — D631 Anemia in chronic kidney disease: Secondary | ICD-10-CM | POA: Diagnosis not present

## 2018-08-13 DIAGNOSIS — I132 Hypertensive heart and chronic kidney disease with heart failure and with stage 5 chronic kidney disease, or end stage renal disease: Secondary | ICD-10-CM | POA: Insufficient documentation

## 2018-08-13 DIAGNOSIS — Z992 Dependence on renal dialysis: Principal | ICD-10-CM

## 2018-08-13 DIAGNOSIS — I5032 Chronic diastolic (congestive) heart failure: Secondary | ICD-10-CM | POA: Diagnosis not present

## 2018-08-13 DIAGNOSIS — K219 Gastro-esophageal reflux disease without esophagitis: Secondary | ICD-10-CM | POA: Diagnosis not present

## 2018-08-13 DIAGNOSIS — E039 Hypothyroidism, unspecified: Secondary | ICD-10-CM | POA: Insufficient documentation

## 2018-08-13 DIAGNOSIS — Z6841 Body Mass Index (BMI) 40.0 and over, adult: Secondary | ICD-10-CM | POA: Diagnosis not present

## 2018-08-13 DIAGNOSIS — D509 Iron deficiency anemia, unspecified: Secondary | ICD-10-CM | POA: Diagnosis not present

## 2018-08-13 DIAGNOSIS — E1122 Type 2 diabetes mellitus with diabetic chronic kidney disease: Secondary | ICD-10-CM | POA: Insufficient documentation

## 2018-08-13 DIAGNOSIS — T82858A Stenosis of vascular prosthetic devices, implants and grafts, initial encounter: Secondary | ICD-10-CM | POA: Diagnosis not present

## 2018-08-13 DIAGNOSIS — N2581 Secondary hyperparathyroidism of renal origin: Secondary | ICD-10-CM | POA: Diagnosis not present

## 2018-08-13 DIAGNOSIS — Z7984 Long term (current) use of oral hypoglycemic drugs: Secondary | ICD-10-CM | POA: Insufficient documentation

## 2018-08-13 DIAGNOSIS — G4730 Sleep apnea, unspecified: Secondary | ICD-10-CM | POA: Insufficient documentation

## 2018-08-13 DIAGNOSIS — E785 Hyperlipidemia, unspecified: Secondary | ICD-10-CM | POA: Insufficient documentation

## 2018-08-13 DIAGNOSIS — Z87891 Personal history of nicotine dependence: Secondary | ICD-10-CM | POA: Insufficient documentation

## 2018-08-13 DIAGNOSIS — Z880 Allergy status to penicillin: Secondary | ICD-10-CM | POA: Diagnosis not present

## 2018-08-13 DIAGNOSIS — E1129 Type 2 diabetes mellitus with other diabetic kidney complication: Secondary | ICD-10-CM | POA: Diagnosis not present

## 2018-08-13 DIAGNOSIS — Z8674 Personal history of sudden cardiac arrest: Secondary | ICD-10-CM | POA: Insufficient documentation

## 2018-08-13 DIAGNOSIS — I871 Compression of vein: Secondary | ICD-10-CM | POA: Diagnosis not present

## 2018-08-13 DIAGNOSIS — L732 Hidradenitis suppurativa: Secondary | ICD-10-CM | POA: Diagnosis not present

## 2018-08-13 HISTORY — PX: IR US GUIDE VASC ACCESS LEFT: IMG2389

## 2018-08-13 HISTORY — PX: IR AV DIALY SHUNT INTRO NEEDLE/INTRAC INITIAL W/PTA/STENT/IMG LT: IMG6104

## 2018-08-13 MED ORDER — HEPARIN SODIUM (PORCINE) 1000 UNIT/ML IJ SOLN
INTRAMUSCULAR | Status: AC | PRN
  Administered 2018-08-13: 3000 [IU] via INTRAVENOUS

## 2018-08-13 MED ORDER — IOPAMIDOL (ISOVUE-300) INJECTION 61%
INTRAVENOUS | Status: AC
  Administered 2018-08-13: 70 mL
  Filled 2018-08-13: qty 100

## 2018-08-13 MED ORDER — LIDOCAINE HCL (PF) 1 % IJ SOLN
INTRAMUSCULAR | Status: AC | PRN
  Administered 2018-08-13: 10 mL

## 2018-08-13 MED ORDER — LIDOCAINE HCL 1 % IJ SOLN
INTRAMUSCULAR | Status: AC
Start: 2018-08-13 — End: 2018-08-13
  Filled 2018-08-13: qty 20

## 2018-08-13 MED ORDER — FENTANYL CITRATE (PF) 100 MCG/2ML IJ SOLN
INTRAMUSCULAR | Status: AC | PRN
  Administered 2018-08-13 (×4): 50 ug via INTRAVENOUS

## 2018-08-13 MED ORDER — MIDAZOLAM HCL 2 MG/2ML IJ SOLN
INTRAMUSCULAR | Status: AC
Start: 2018-08-13 — End: 2018-08-13
  Filled 2018-08-13: qty 2

## 2018-08-13 MED ORDER — FENTANYL CITRATE (PF) 100 MCG/2ML IJ SOLN
INTRAMUSCULAR | Status: AC
  Filled 2018-08-13: qty 2

## 2018-08-13 MED ORDER — IOPAMIDOL (ISOVUE-300) INJECTION 61%
INTRAVENOUS | Status: AC
  Filled 2018-08-13: qty 100

## 2018-08-13 MED ORDER — HEPARIN SODIUM (PORCINE) 1000 UNIT/ML IJ SOLN
INTRAMUSCULAR | Status: AC
  Filled 2018-08-13: qty 1

## 2018-08-13 MED ORDER — LIDOCAINE HCL 1 % IJ SOLN
INTRAMUSCULAR | Status: AC
  Filled 2018-08-13: qty 20

## 2018-08-13 MED ORDER — MIDAZOLAM HCL 2 MG/2ML IJ SOLN
INTRAMUSCULAR | Status: AC | PRN
  Administered 2018-08-13: 0.5 mg via INTRAVENOUS

## 2018-08-13 NOTE — H&P (Signed)
Chief Complaint: Patient was seen in consultation today for left arm dialysis graft evaluation/possible angioplasty/stent at the request of [place]  Referring Physician(s): [place]  Supervising Physician: Corrie Mckusick  Patient Status: [name]  History of Present Illness: [name] is a 57 y.o. female   ESRD Left upper arm dialysis graft running slow for 2 weeks Last intervention in IR 03/2018  IMPRESSION: 1. 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 2. High-grade stenosis of the distal cephalic vein in the cephalic arch at its confluence with the subclavian vein. 3. Successful angioplasty of the proximal stenosis to 6 mm and the distal stenosis to 7 mm. Of note, the more central stenosis in the cephalic arch was quite symptomatic during angioplasty. If the patient requires recurrent treatment in the future moderate sedation would be advised. ACCESS: This access remains amenable to future percutaneous interventions as clinically indicated.  Scheduled now for evaluation and possible angioplasty/stent placement. Possible tunneled dialysis catheter if needed   Past Medical History:  Diagnosis Date  . Chronic diastolic CHF (congestive heart failure) ([place])   . Chronic kidney disease (CKD), stage III (moderate) ([place])    [name] nephrology lov note 05-12-2013 on pt chart now on HD  . DCM (dilated cardiomyopathy) ([place])    nonischemic - EF now 60-65% by echo 2017  . Depression   . DM type 2 (diabetes mellitus, type 2) ([facility])    diet controlledwith retinopathy and nephropathy  . ESRD on hemodialysis ([place]) 08/21/2017  . GERD (gastroesophageal reflux disease)   . H/O cardiac arrest 10/2012  . Hard of hearing 10/2012   now wears 2 hearing aids  . History of hemodialysis dec 2013  . HTN (hypertension)   . Hydradenitis   . Hyperkalemia   . Hyperlipidemia   . Hypothyroidism   . Iron  deficiency anemia   . Morbid obesity ([place])   . Multinodular goiter   . Neuropathic pain 08/21/2017  . Orthostatic hypotension 06/27/2018  . Pneumonia Nov 10, 2012  . Sleep apnea    Intolerant to CPAP  . Tinnitus 12/26/2016  . Typical atrial flutter ([place]) 04/10/2017  . Urinary tract infection    taking antibiotics for 3 days prior to surgery    Past Surgical History:  Procedure Laterality Date  . AV FISTULA PLACEMENT Left 07/25/2013   Procedure: ARTERIOVENOUS (AV) FISTULA CREATION ;  Surgeon: Rosetta Posner, MD;  Location: [place];  Service: Vascular;  Laterality: Left;  . CYSTOSCOPY W/ RETROGRADES  11/16/2012   Procedure: CYSTOSCOPY WITH RETROGRADE PYELOGRAM;  Surgeon: Reece Packer, MD;  Location: [facility];  Service: Urology;  Laterality: Bilateral;  CYSTOSCOPY,BILATERAL RETROGRADE PYELOGRAM/ REMOVAL LEFT URETERAL STENT/ FULGERATION BLADDER MUCOSA/ INSERTION RIGHT URETERAL STENT  . CYSTOSCOPY W/ URETERAL STENT PLACEMENT  05/28/2012   Procedure: CYSTOSCOPY WITH RETROGRADE PYELOGRAM/URETERAL STENT PLACEMENT;  Surgeon: Reece Packer, MD;  Location: [facility];  Service: Urology;  Laterality: Left;  . CYSTOSCOPY WITH RETROGRADE PYELOGRAM, URETEROSCOPY AND STENT PLACEMENT Right 06/23/2013   Procedure: CYSTOSCOPY WITH RIGHT URETEROSCOPY, RIGHT  RETROGRADE PYELOGRAM, WITH LASER LIPOTRIPSY AND RIGHT URETERAL STENT EXCHANGE ;  Surgeon: Molli Hazard, MD;  Location: [facility];  Service: Urology;  Laterality: Right;  STENT EXCHANGE    . CYSTOSCOPY WITH RETROGRADE PYELOGRAM, URETEROSCOPY AND STENT PLACEMENT Right 11/06/2013   Procedure: CYSTOSCOPY WITH RETROGRADE PYELOGRAM, URETEROSCOPY STONE REMOVAL AND STENT REMOVAL;  Surgeon: Molli Hazard, MD;  Location: [facility];  Service: Urology;  Laterality: Right;  . HOLMIUM LASER APPLICATION N/A 0/93/2355   Procedure: HOLMIUM LASER APPLICATION;  Surgeon: Molli Hazard, MD;  Location: [facility];  Service: Urology;  Laterality: N/A;  . INSERTION OF  DIALYSIS CATHETER N/A 09/24/2013   Procedure: INSERTION OF DIALYSIS CATHETER;  Surgeon: Rosetta Posner, MD;  [facility];  Service: Vascular;  Laterality: N/A;  . IR AV DIALY SHUNT INTRO NEEDLE/INTRACATH INITIAL W/PTA/IMG LEFT  04/19/2018  . IR US GUIDE VASC ACCESS LEFT  04/19/2018  . PORT-A-CATH REMOVAL    . PORTACATH PLACEMENT    . REVISON OF ARTERIOVENOUS FISTULA Left 07/25/2016   Procedure: REVISON OF LEFT ARTERIOVENOUS FISTULA;  Surgeon: Elam Dutch, MD;  Location: [place];  Service: Vascular;  Laterality: Left;    Allergies: Rosiglitazone; Amoxicillin; and Amoxicillin-pot clavulanate  Medications: Prior to Admission medications   Medication Sig Start Date End Date Taking? Authorizing Provider  acetaminophen (TYLENOL) 500 MG tablet Take 1,000 mg by mouth daily as needed for moderate pain.   Yes [provider]  aspirin EC 81 MG tablet Take 81 mg by mouth at bedtime.    Yes [provider]  Calcium Acetate 667 MG TABS Take 667 mg by mouth 3 (three) times daily with meals. And snacks   Yes [provider]  Dakins (SODIUM HYPOCHLORITE) 0.25 % SOLN Apply 1 application topically 3 (three) times daily.   Yes [provider]  diltiazem (CARDIZEM) 30 MG tablet Take 1 tablet every 4 hours AS NEEDED for rapid heart rate lasting more than 30 mins as long as top blood pressure >100. 03/09/17  Yes Sherran Needs, NP  diphenhydrAMINE (BENADRYL) 25 MG tablet Take 25 mg by mouth daily as needed for allergies.   Yes [provider]  DULoxetine (CYMBALTA) 60 MG capsule Take 1 capsule (60 mg total) by mouth daily. 12/03/12  Yes Janece Canterbury, MD  DYMISTA 137-50 MCG/ACT SUSP PLACE 2 SPRAYS INTO THE NOSE DAILY. Patient taking differently: Place 2 sprays into both nostrils daily as needed (allergies).  02/06/18  Yes Mannam, Praveen, MD  famotidine (PEPCID) 20 MG tablet TAKE 1 TABLET BY MOUTH AT BEDTIME Patient taking differently: Take 20 mg by mouth at bedtime  10/15/14  Yes Tanda Rockers, MD  feeding supplement, RESOURCE BREEZE, (RESOURCE BREEZE) LIQD Take 1 Container by mouth 2 (two) times daily between meals.    Yes [provider]  glipiZIDE (GLUCOTROL) 5 MG tablet Take 0.5 tablets (2.5 mg total) by mouth daily. 08/31/16  Yes Debbe Odea, MD  levothyroxine (SYNTHROID, LEVOTHROID) 75 MCG tablet Take 75 mcg by mouth daily. 08/17/15  Yes [provider]  midodrine (PROAMATINE) 10 MG tablet Take 1 tablet (10 mg total) by mouth 3 (three) times daily. Patient taking differently: Take 10 mg by mouth every Monday, Wednesday, and Friday. Dialysis days 08/31/16  Yes Debbe Odea, MD  multivitamin (RENA-VIT) TABS tablet Take 1 tablet by mouth daily. 08/16/15  Yes [provider]  oxyCODONE-acetaminophen (PERCOCET/ROXICET) 5-325 MG tablet Take 1-2 tablets by mouth every 6 (six) hours as needed. Patient has not taken since started dialysis 08/2013 due to causes hypotension Patient taking differently: Take 2 tablets by mouth every 6 (six) hours as needed for moderate pain.  07/25/16  Yes Virgina Jock A, PA-C  pravastatin (PRAVACHOL) 40 MG tablet Take 40 mg by mouth at bedtime.  12/03/12  Yes Short, Noah Delaine, MD  Probiotic Product (PROBIOTIC DAILY PO) Take 1 tablet by mouth 2 (two)  times daily.    Yes [provider]  sevelamer carbonate (RENVELA) 800 MG tablet Take 2,400 mg by mouth 3 (three) times daily with meals.   Yes [provider]  sucroferric oxyhydroxide (VELPHORO) 500 MG chewable tablet Chew 1,000 mg by mouth 3 (three) times daily with meals.    Yes [provider]  ciprofloxacin (CIPRO) 500 MG tablet Take 1 tablet (500 mg total) by mouth daily. Patient not taking: Reported on 07/16/2018 05/28/18   Tommy Medal, Lavell Islam, MD  doxycycline (VIBRA-TABS) 100 MG tablet Take 1 tablet (100 mg total) by mouth 2 (two) times daily. Patient not taking: Reported on 07/16/2018 05/28/18   Tommy Medal, Lavell Islam, MD      Family History  Problem Relation Age of Onset  . Coronary artery disease Mother   . Hypertension Mother   . Diabetes type II Mother   . Coronary artery disease Father   . Hypertension Father   . Malignant hyperthermia Father   . Cancer Maternal Grandfather        ? Type  . Kidney failure Maternal Grandmother     Social History   Socioeconomic History  . Marital status: Married    Spouse name: Not on file  . Number of children: Not on file  . Years of education: Not on file  . Highest education level: Not on file  Occupational History  . Not on file  Social Needs  . Financial resource strain: Not on file  . Food insecurity:    Worry: Not on file    Inability: Not on file  . Transportation needs:    Medical: Not on file    Non-medical: Not on file  Tobacco Use  . Smoking status: Former Smoker    Packs/day: 0.25    Years: 1.00    Pack years: 0.25    Types: Cigarettes    Last attempt to quit: 11/28/1979    Years since quitting: 38.7  . Smokeless tobacco: Never Used  Substance and Sexual Activity  . Alcohol use: No  . Drug use: No  . Sexual activity: Never    Birth control/protection: Abstinence  Lifestyle  . Physical activity:    Days per week: Not on file    Minutes per session: Not on file  . Stress: Not on file  Relationships  . Social connections:    Talks on phone: Not on file    Gets together: Not on file    Attends religious service: Not on file    Active member of club or organization: Not on file    Attends meetings of clubs or organizations: Not on file    Relationship status: Not on file  Other Topics Concern  . Not on file  Social History Narrative  . Not on file     Review of Systems: A 12 point ROS discussed and pertinent positives are indicated in the HPI above.  All other systems are negative.  Review of Systems  Constitutional: Negative for activity change, fatigue and fever.  Respiratory: Negative for cough and shortness of breath.    Cardiovascular: Negative for chest pain.  Musculoskeletal: Positive for back pain.  Neurological: Negative for weakness.  Psychiatric/Behavioral: Negative for behavioral problems and confusion.    Vital Signs: LMP 06/13/2013   Physical Exam  Imaging: No results found.  Labs:  CBC: No results for input(s): WBC, HGB, HCT, PLT in the last 8760 hours.  COAGS: No results for input(s): INR, APTT in the  last 8760 hours.  BMP: No results for input(s): NA, K, CL, CO2, GLUCOSE, BUN, CALCIUM, CREATININE, GFRNONAA, GFRAA in the last 8760 hours.  Invalid input(s): CMP  LIVER FUNCTION TESTS: No results for input(s): BILITOT, AST, ALT, ALKPHOS, PROT, ALBUMIN in the last 8760 hours.  TUMOR MARKERS: No results for input(s): AFPTM, CEA, CA199, CHROMGRNA in the last 8760 hours.  Assessment and Plan:  Left arm dialysis graft running slow Scheduled for intervention today Possible angioplasty/stent vs possible tunneled dialysis catheter placement Risks and benefits discussed with the patient including, but not limited to bleeding, infection, vascular injury, pneumothorax which may require chest tube placement, air embolism or even death  All of the patient's questions were answered, patient is agreeable to proceed. Consent signed and in chart.   Thank you for this interesting consult.  I greatly enjoyed meeting [name] and look forward to participating in their care.  A copy of this report was sent to the requesting provider on this date.  Electronically Signed: Lavonia Drafts, PA-C 08/13/2018, 8:14 AM   I spent a total of    25 Minutes in face to face in clinical consultation, greater than 50% of which was counseling/coordinating care for left arm dialysis graft evaluation

## 2018-08-13 NOTE — Procedures (Signed)
Interventional Radiology Procedure Note  Procedure: US guided access left UE fistula dialysis circuit access for angiogram and treatment of venous outflow stenosis. PTA and stenting of cephalic arch stenosis to restore flow.   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   74mm PTA of cephalic vein stenosis at humeral head.   Less than 30% residual stenosis at completion.   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   Complications: None  Recommendations:  - Ok to use - Do not submerge for 7 days - Routine wound care - dc 1 hour   Signed,  Dulcy Fanny. Earleen Newport, DO

## 2018-08-13 NOTE — Discharge Instructions (Signed)

## 2018-08-14 ENCOUNTER — Other Ambulatory Visit (HOSPITAL_COMMUNITY): Payer: Self-pay | Admitting: Nephrology

## 2018-08-14 ENCOUNTER — Ambulatory Visit (HOSPITAL_COMMUNITY)
Admission: RE | Admit: 2018-08-14 | Discharge: 2018-08-14 | Disposition: A | Discharge status: Home / Self Care | Payer: Medicare Other | Source: Ambulatory Visit | Attending: Nephrology | Admitting: Nephrology

## 2018-08-14 ENCOUNTER — Other Ambulatory Visit: Payer: Self-pay

## 2018-08-14 ENCOUNTER — Encounter (HOSPITAL_COMMUNITY): Payer: Self-pay

## 2018-08-14 ENCOUNTER — Other Ambulatory Visit: Payer: Self-pay | Admitting: Student

## 2018-08-14 DIAGNOSIS — Z7982 Long term (current) use of aspirin: Secondary | ICD-10-CM | POA: Insufficient documentation

## 2018-08-14 DIAGNOSIS — Y832 Surgical operation with anastomosis, bypass or graft as the cause of abnormal reaction of the patient, or of later complication, without mention of misadventure at the time of the procedure: Secondary | ICD-10-CM | POA: Insufficient documentation

## 2018-08-14 DIAGNOSIS — N186 End stage renal disease: Secondary | ICD-10-CM

## 2018-08-14 DIAGNOSIS — T82858A Stenosis of vascular prosthetic devices, implants and grafts, initial encounter: Secondary | ICD-10-CM | POA: Diagnosis not present

## 2018-08-14 DIAGNOSIS — Z880 Allergy status to penicillin: Secondary | ICD-10-CM | POA: Insufficient documentation

## 2018-08-14 HISTORY — PX: IR AV DIALY SHUNT INTRO NEEDLE/INTRACATH INITIAL W/PTA/IMG LEFT: IMG6103

## 2018-08-14 HISTORY — PX: IR US GUIDE VASC ACCESS LEFT: IMG2389

## 2018-08-14 LAB — GLUCOSE, CAPILLARY: GLUCOSE-CAPILLARY: 107 mg/dL — AB (ref 70–99)

## 2018-08-14 LAB — APTT: aPTT: 38 seconds — ABNORMAL HIGH (ref 24–36)

## 2018-08-14 LAB — PROTIME-INR
INR: 1.19
PROTHROMBIN TIME: 15 s (ref 11.4–15.2)

## 2018-08-14 MED ORDER — HEPARIN SODIUM (PORCINE) 1000 UNIT/ML IJ SOLN
INTRAMUSCULAR | Status: AC
  Filled 2018-08-14: qty 1

## 2018-08-14 MED ORDER — MIDAZOLAM HCL 2 MG/2ML IJ SOLN
INTRAMUSCULAR | Status: AC
  Filled 2018-08-14: qty 2

## 2018-08-14 MED ORDER — IOPAMIDOL (ISOVUE-300) INJECTION 61%
INTRAVENOUS | Status: AC
  Administered 2018-08-14: 50 mL
  Filled 2018-08-14: qty 100

## 2018-08-14 MED ORDER — MIDAZOLAM HCL 2 MG/2ML IJ SOLN
INTRAMUSCULAR | Status: AC | PRN
  Administered 2018-08-14: 1 mg via INTRAVENOUS
  Administered 2018-08-14 (×2): 0.5 mg via INTRAVENOUS

## 2018-08-14 MED ORDER — FENTANYL CITRATE (PF) 100 MCG/2ML IJ SOLN
INTRAMUSCULAR | Status: AC
  Filled 2018-08-14: qty 2

## 2018-08-14 MED ORDER — LIDOCAINE HCL 1 % IJ SOLN
INTRAMUSCULAR | Status: AC
  Filled 2018-08-14: qty 20

## 2018-08-14 MED ORDER — FENTANYL CITRATE (PF) 100 MCG/2ML IJ SOLN
INTRAMUSCULAR | Status: AC | PRN
  Administered 2018-08-14 (×4): 25 ug via INTRAVENOUS

## 2018-08-14 MED ORDER — HYDROCODONE-ACETAMINOPHEN 5-325 MG PO TABS: 1.0000 | ORAL_TABLET | ORAL | Status: DC | PRN

## 2018-08-14 MED ORDER — HEPARIN SODIUM (PORCINE) 1000 UNIT/ML IJ SOLN
INTRAMUSCULAR | Status: AC | PRN
  Administered 2018-08-14: 3000 [IU] via INTRAVENOUS

## 2018-08-14 MED ORDER — SODIUM CHLORIDE 0.9 % IV SOLN: INTRAVENOUS | Status: DC

## 2018-08-14 NOTE — Discharge Instructions (Signed)

## 2018-08-14 NOTE — Procedures (Signed)
  Procedure: Shuntogram and venous PTA  79mm at fistula EBL:   minimal Complications:  none immediate  See full dictation in [facility].  Dillard Cannon MD Main # [phone] Pager  [phone]

## 2018-08-14 NOTE — Progress Notes (Signed)
Referring Physician(s): Boaz  Supervising Physician: Arne Cleveland  Patient Status:  [facility] OP  Chief Complaint:  Left upper extremity dialysis graft Slow flow in dialysis --- After intervention just yesterday  Subjective:  Interventional Radiology Procedure Note 08/13/18  Procedure: US guided access left UE fistula dialysis circuit access for angiogram and treatment of venous outflow stenosis. PTA and stenting of cephalic arch stenosis to restore flow.   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   59mm PTA of cephalic vein stenosis at humeral head.  Less than 30% residual stenosis at completion.  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  Now returns today for re attempt at angioplasty/stent vs tunneled dialysis catheter placement    Allergies: Rosiglitazone; Amoxicillin; and Amoxicillin-pot clavulanate  Medications: Prior to Admission medications   Medication Sig Start Date End Date Taking? Authorizing Provider  acetaminophen (TYLENOL) 500 MG tablet Take 1,000 mg by mouth daily as needed for moderate pain.   Yes [provider]  aspirin EC 81 MG tablet Take 81 mg by mouth at bedtime.    Yes [provider]  Calcium Acetate 667 MG TABS Take 667 mg by mouth 3 (three) times daily with meals. And snacks   Yes [provider]  Dakins (SODIUM HYPOCHLORITE) 0.25 % SOLN Apply 1 application topically 3 (three) times daily.   Yes [provider]  diphenhydrAMINE (BENADRYL) 25 MG tablet Take 25 mg by mouth daily as needed for allergies.   Yes [provider]  DULoxetine (CYMBALTA) 60 MG capsule Take 1 capsule (60 mg total) by mouth daily. 12/03/12  Yes Janece Canterbury, MD  DYMISTA 137-50  MCG/ACT SUSP PLACE 2 SPRAYS INTO THE NOSE DAILY. Patient taking differently: Place 2 sprays into both nostrils daily as needed (allergies).  02/06/18  Yes Mannam, Praveen, MD  famotidine (PEPCID) 20 MG tablet TAKE 1 TABLET BY MOUTH AT BEDTIME Patient taking differently: Take 20 mg by mouth at bedtime 10/15/14  Yes Tanda Rockers, MD  feeding supplement, RESOURCE BREEZE, (RESOURCE BREEZE) LIQD Take 1 Container by mouth 2 (two) times daily between meals.    Yes [provider]  glipiZIDE (GLUCOTROL) 5 MG tablet Take 0.5 tablets (2.5 mg total) by mouth daily. 08/31/16  Yes Debbe Odea, MD  levothyroxine (SYNTHROID, LEVOTHROID) 75 MCG tablet Take 75 mcg by mouth daily. 08/17/15  Yes [provider]  multivitamin (RENA-VIT) TABS tablet Take 1 tablet by mouth daily. 08/16/15  Yes [provider]  oxyCODONE-acetaminophen (PERCOCET/ROXICET) 5-325 MG tablet Take 1-2 tablets by mouth every 6 (six) hours as needed. Patient has not taken since started dialysis 08/2013 due to causes hypotension Patient taking differently: Take 2 tablets by mouth every 6 (six) hours as needed for moderate pain.  07/25/16  Yes Virgina Jock A, PA-C  pravastatin (PRAVACHOL) 40 MG tablet Take 40 mg by mouth at bedtime.  12/03/12  Yes Short, Noah Delaine, MD  Probiotic Product (PROBIOTIC DAILY PO) Take 1 tablet by mouth 2 (two) times daily.    Yes [provider]  sevelamer carbonate (RENVELA) 800 MG tablet Take 2,400 mg by mouth 3 (three) times daily with meals.   Yes [provider]  sucroferric oxyhydroxide (VELPHORO) 500 MG chewable tablet Chew 1,000 mg by mouth 3 (three) times daily with meals.  Yes [provider]  ciprofloxacin (CIPRO) 500 MG tablet Take 1 tablet (500 mg total) by mouth daily. Patient not taking: Reported on 07/16/2018 05/28/18   Tommy Medal, Lavell Islam, MD  diltiazem (CARDIZEM) 30 MG tablet Take 1 tablet every 4 hours AS NEEDED for rapid heart rate lasting more than  30 mins as long as top blood pressure >100. 03/09/17   Sherran Needs, NP  doxycycline (VIBRA-TABS) 100 MG tablet Take 1 tablet (100 mg total) by mouth 2 (two) times daily. Patient not taking: Reported on 07/16/2018 05/28/18   Tommy Medal, Lavell Islam, MD  midodrine (PROAMATINE) 10 MG tablet Take 1 tablet (10 mg total) by mouth 3 (three) times daily. Patient taking differently: Take 10 mg by mouth every Monday, Wednesday, and Friday. Dialysis days 08/31/16   Debbe Odea, MD     Vital Signs: BP 137/74 (BP Location: Right Wrist)   Pulse 92   Temp 98.4 F (36.9 C) (Oral)   Ht 5' 6.5" (1.689 m)   Wt (!) 350 lb (158.8 kg)   LMP 06/13/2013   SpO2 96%   BMI 55.65 kg/m   Physical Exam  Constitutional: She is oriented to person, place, and time.  Cardiovascular: Normal rate and regular rhythm.  Pulmonary/Chest: Effort normal.  Abdominal: Soft. Bowel sounds are normal.  Musculoskeletal: Normal range of motion.  Left arm dialysis graft no pulse No thrill  Neurological: She is alert and oriented to person, place, and time.  Skin: Skin is warm and dry.  Psychiatric: She has a normal mood and affect. Her behavior is normal. Judgment and thought content normal.  Vitals reviewed.   Imaging: Ir US Guide Vasc Access Left  Result Date: 08/13/2018 INDICATION: 57 year old female with a history left upper extremity fistula with increased pulsatility, high pressures on dialysis machine, and suspected venous outflow stenosis. EXAM: ULTRASOUND GUIDED ACCESS LEFT UPPER EXTREMITY FISTULA ANGIOGRAM TREATMENT CRITICAL STENOSIS CEPHALIC VEIN OUTFLOW STENOSIS WITH 8 MM COVERED STENT AND BALLOON ANGIOPLASTY TREATMENT PROXIMAL VENOUS OUTFLOW STENOSIS WITH 7 MM X 40 MM BALLOON ANGIOPLASTY MEDICATIONS: None. ANESTHESIA/SEDATION: Moderate Sedation Time:  40 minutes 0.5 mg Versed, 200 mcg fentanyl, 3000 units heparin The patient was continuously monitored during the procedure by the interventional radiology nurse under  my direct supervision. FLUOROSCOPY TIME:  Fluoroscopy Time: 5 minutes 54 seconds (717 mGy). COMPLICATIONS: None PROCEDURE: Informed written consent was obtained from the patient after a thorough discussion of the procedural risks, benefits and alternatives. All questions were addressed. Maximal Sterile Barrier Technique was utilized including caps, mask, sterile gowns, sterile gloves, sterile drape, hand hygiene and skin antiseptic. A timeout was performed prior to the initiation of the procedure. Patient is positioned supine position on the fluoroscopy table with the left arm abducted. The left arm was prepped and draped in the usual sterile fashion. 1% lidocaine was used for local anesthesia. Using ultrasound guidance, the left upper extremity fistula was accessed directed centrally towards the venous outflow. A 6 French sheath was placed on a Bentson wire. Angiogram was performed. High-grade stenosis of the venous outflow identified just beyond the aneurysmal segment in the antecubital region was treated with 7 mm x 40 mm balloon angioplasty for improved flow. After angioplasty less than 30% residual stenosis remained. A central critical stenosis of the cephalic venous outflow was then treated. Kumpe the catheter was navigated to the critical stenosis and and crossed into the subclavian vein with a standard Glidewire. Rose in wire was placed. We elected to treat this critical  stenosis with covered stenting. Sheath was exchanged for 8 French sheath. An 8 mm x 60 mm Co vera covered stent was selected and passed through the critical stenosis. Stent was deployed after baseline angiogram was prepared. Post angioplasty was performed to 7 mm. Angiogram performed. A residual stenosis at the level of the humeral head was treated with 6 mm x 40 mm balloon angioplasty with less than 30% residual stenosis. Completion angiogram was performed. Palpable thrill was confirmed at the antecubital region. Sheaths and wires were  removed and a stay suture was placed at the access site. Patient tolerated the procedure well and remained hemodynamically stable throughout. No complications were encountered and no significant blood loss. IMPRESSION: Status post ultrasound guided access left upper extremity fistula and treatment of tandem venous outflow stenoses, with covered stenting of a critical cephalic vein stenosis and standard balloon angioplasty of a proximal venous outflow stenosis with restoration of excellent thrill and improved flow. Less than 30% stenosis at the completion. Signed, Dulcy Fanny. Dellia Nims, RPVI [facility] Radiology ACCESS: This access remains amenable to future percutaneous interventions as clinically indicated. Electronically Signed   By: Corrie Mckusick D.O.   On: 08/13/2018 11:48   Ir Av Dialy Shunt Intro Needle/intrac Initial W/pta/stent/img Left  Result Date: 08/13/2018 INDICATION: 57 year old female with a history left upper extremity fistula with increased pulsatility, high pressures on dialysis machine, and suspected venous outflow stenosis. EXAM: ULTRASOUND GUIDED ACCESS LEFT UPPER EXTREMITY FISTULA ANGIOGRAM TREATMENT CRITICAL STENOSIS CEPHALIC VEIN OUTFLOW STENOSIS WITH 8 MM COVERED STENT AND BALLOON ANGIOPLASTY TREATMENT PROXIMAL VENOUS OUTFLOW STENOSIS WITH 7 MM X 40 MM BALLOON ANGIOPLASTY MEDICATIONS: None. ANESTHESIA/SEDATION: Moderate Sedation Time:  40 minutes 0.5 mg Versed, 200 mcg fentanyl, 3000 units heparin The patient was continuously monitored during the procedure by the interventional radiology nurse under my direct supervision. FLUOROSCOPY TIME:  Fluoroscopy Time: 5 minutes 54 seconds (717 mGy). COMPLICATIONS: None PROCEDURE: Informed written consent was obtained from the patient after a thorough discussion of the procedural risks, benefits and alternatives. All questions were addressed. Maximal Sterile Barrier Technique was utilized including  caps, mask, sterile gowns, sterile gloves, sterile drape, hand hygiene and skin antiseptic. A timeout was performed prior to the initiation of the procedure. Patient is positioned supine position on the fluoroscopy table with the left arm abducted. The left arm was prepped and draped in the usual sterile fashion. 1% lidocaine was used for local anesthesia. Using ultrasound guidance, the left upper extremity fistula was accessed directed centrally towards the venous outflow. A 6 French sheath was placed on a Bentson wire. Angiogram was performed. High-grade stenosis of the venous outflow identified just beyond the aneurysmal segment in the antecubital region was treated with 7 mm x 40 mm balloon angioplasty for improved flow. After angioplasty less than 30% residual stenosis remained. A central critical stenosis of the cephalic venous outflow was then treated. Kumpe the catheter was navigated to the critical stenosis and and crossed into the subclavian vein with a standard Glidewire. Rose in wire was placed. We elected to treat this critical stenosis with covered stenting. Sheath was exchanged for 8 French sheath. An 8 mm x 60 mm Co vera covered stent was selected and passed through the critical stenosis. Stent was deployed after baseline angiogram was prepared. Post angioplasty was performed to 7 mm. Angiogram performed. A residual stenosis at the level of the humeral head was treated with 6 mm x 40 mm balloon angioplasty with less than 30%  residual stenosis. Completion angiogram was performed. Palpable thrill was confirmed at the antecubital region. Sheaths and wires were removed and a stay suture was placed at the access site. Patient tolerated the procedure well and remained hemodynamically stable throughout. No complications were encountered and no significant blood loss. IMPRESSION: Status post ultrasound guided access left upper extremity fistula and treatment of tandem venous outflow stenoses, with covered  stenting of a critical cephalic vein stenosis and standard balloon angioplasty of a proximal venous outflow stenosis with restoration of excellent thrill and improved flow. Less than 30% stenosis at the completion. Signed, Dulcy Fanny. Dellia Nims, RPVI [facility] Radiology ACCESS: This access remains amenable to future percutaneous interventions as clinically indicated. Electronically Signed   By: Corrie Mckusick D.O.   On: 08/13/2018 11:48    Labs:  CBC: No results for input(s): WBC, HGB, HCT, PLT in the last 8760 hours.  COAGS: No results for input(s): INR, APTT in the last 8760 hours.  BMP: No results for input(s): NA, K, CL, CO2, GLUCOSE, BUN, CALCIUM, CREATININE, GFRNONAA, GFRAA in the last 8760 hours.  Invalid input(s): CMP  LIVER FUNCTION TESTS: No results for input(s): BILITOT, AST, ALT, ALKPHOS, PROT, ALBUMIN in the last 8760 hours.  Assessment and Plan:  Left arm dialysis graft evaluation ands intervention yesterday in IR Immediate OP dialysis post procedure Again with slow flow and alarming machine Now rescheduled for re attempt at angioplasty/stent placement vs tunneled dialysis catheter placement Risks and benefits discussed with the patient including, but not limited to bleeding, infection, vascular injury, pneumothorax which may require chest tube placement, air embolism or even death  All of the patient's questions were answered, patient is agreeable to proceed. Consent signed and in chart.    Electronically Signed: Lavonia Drafts, PA-C 08/14/2018, 12:13 PM   I spent a total of 25 Minutes at the the patient's bedside AND on the patient's hospital floor or unit, greater than 50% of which was counseling/coordinating care for left arm dialysis evaluation/possible tunneled dialysis catheter placement

## 2018-08-15 DIAGNOSIS — N2581 Secondary hyperparathyroidism of renal origin: Secondary | ICD-10-CM | POA: Diagnosis not present

## 2018-08-15 DIAGNOSIS — N186 End stage renal disease: Secondary | ICD-10-CM | POA: Diagnosis not present

## 2018-08-15 DIAGNOSIS — E1129 Type 2 diabetes mellitus with other diabetic kidney complication: Secondary | ICD-10-CM | POA: Diagnosis not present

## 2018-08-15 DIAGNOSIS — D631 Anemia in chronic kidney disease: Secondary | ICD-10-CM | POA: Diagnosis not present

## 2018-08-15 DIAGNOSIS — D509 Iron deficiency anemia, unspecified: Secondary | ICD-10-CM | POA: Diagnosis not present

## 2018-08-15 DIAGNOSIS — L732 Hidradenitis suppurativa: Secondary | ICD-10-CM | POA: Diagnosis not present

## 2018-08-16 DIAGNOSIS — N186 End stage renal disease: Secondary | ICD-10-CM | POA: Diagnosis not present

## 2018-08-16 DIAGNOSIS — L732 Hidradenitis suppurativa: Secondary | ICD-10-CM | POA: Diagnosis not present

## 2018-08-16 DIAGNOSIS — E1129 Type 2 diabetes mellitus with other diabetic kidney complication: Secondary | ICD-10-CM | POA: Diagnosis not present

## 2018-08-16 DIAGNOSIS — D509 Iron deficiency anemia, unspecified: Secondary | ICD-10-CM | POA: Diagnosis not present

## 2018-08-16 DIAGNOSIS — D631 Anemia in chronic kidney disease: Secondary | ICD-10-CM | POA: Diagnosis not present

## 2018-08-16 DIAGNOSIS — N2581 Secondary hyperparathyroidism of renal origin: Secondary | ICD-10-CM | POA: Diagnosis not present

## 2018-08-19 DIAGNOSIS — E1129 Type 2 diabetes mellitus with other diabetic kidney complication: Secondary | ICD-10-CM | POA: Diagnosis not present

## 2018-08-19 DIAGNOSIS — N186 End stage renal disease: Secondary | ICD-10-CM | POA: Diagnosis not present

## 2018-08-19 DIAGNOSIS — N2581 Secondary hyperparathyroidism of renal origin: Secondary | ICD-10-CM | POA: Diagnosis not present

## 2018-08-19 DIAGNOSIS — D631 Anemia in chronic kidney disease: Secondary | ICD-10-CM | POA: Diagnosis not present

## 2018-08-19 DIAGNOSIS — D509 Iron deficiency anemia, unspecified: Secondary | ICD-10-CM | POA: Diagnosis not present

## 2018-08-19 DIAGNOSIS — L732 Hidradenitis suppurativa: Secondary | ICD-10-CM | POA: Diagnosis not present

## 2018-08-20 ENCOUNTER — Ambulatory Visit: Payer: Medicare Other | Admitting: Podiatry

## 2018-08-21 DIAGNOSIS — D631 Anemia in chronic kidney disease: Secondary | ICD-10-CM | POA: Diagnosis not present

## 2018-08-21 DIAGNOSIS — N186 End stage renal disease: Secondary | ICD-10-CM | POA: Diagnosis not present

## 2018-08-21 DIAGNOSIS — E1129 Type 2 diabetes mellitus with other diabetic kidney complication: Secondary | ICD-10-CM | POA: Diagnosis not present

## 2018-08-21 DIAGNOSIS — D509 Iron deficiency anemia, unspecified: Secondary | ICD-10-CM | POA: Diagnosis not present

## 2018-08-21 DIAGNOSIS — L732 Hidradenitis suppurativa: Secondary | ICD-10-CM | POA: Diagnosis not present

## 2018-08-21 DIAGNOSIS — N2581 Secondary hyperparathyroidism of renal origin: Secondary | ICD-10-CM | POA: Diagnosis not present

## 2018-08-23 DIAGNOSIS — L732 Hidradenitis suppurativa: Secondary | ICD-10-CM | POA: Diagnosis not present

## 2018-08-23 DIAGNOSIS — D631 Anemia in chronic kidney disease: Secondary | ICD-10-CM | POA: Diagnosis not present

## 2018-08-23 DIAGNOSIS — N186 End stage renal disease: Secondary | ICD-10-CM | POA: Diagnosis not present

## 2018-08-23 DIAGNOSIS — N2581 Secondary hyperparathyroidism of renal origin: Secondary | ICD-10-CM | POA: Diagnosis not present

## 2018-08-23 DIAGNOSIS — E1129 Type 2 diabetes mellitus with other diabetic kidney complication: Secondary | ICD-10-CM | POA: Diagnosis not present

## 2018-08-23 DIAGNOSIS — D509 Iron deficiency anemia, unspecified: Secondary | ICD-10-CM | POA: Diagnosis not present

## 2018-08-26 DIAGNOSIS — D631 Anemia in chronic kidney disease: Secondary | ICD-10-CM | POA: Diagnosis not present

## 2018-08-26 DIAGNOSIS — N186 End stage renal disease: Secondary | ICD-10-CM | POA: Diagnosis not present

## 2018-08-26 DIAGNOSIS — L732 Hidradenitis suppurativa: Secondary | ICD-10-CM | POA: Diagnosis not present

## 2018-08-26 DIAGNOSIS — E1129 Type 2 diabetes mellitus with other diabetic kidney complication: Secondary | ICD-10-CM | POA: Diagnosis not present

## 2018-08-26 DIAGNOSIS — N2581 Secondary hyperparathyroidism of renal origin: Secondary | ICD-10-CM | POA: Diagnosis not present

## 2018-08-26 DIAGNOSIS — D509 Iron deficiency anemia, unspecified: Secondary | ICD-10-CM | POA: Diagnosis not present

## 2018-08-27 DIAGNOSIS — E1129 Type 2 diabetes mellitus with other diabetic kidney complication: Secondary | ICD-10-CM | POA: Diagnosis not present

## 2018-08-27 DIAGNOSIS — N186 End stage renal disease: Secondary | ICD-10-CM | POA: Diagnosis not present

## 2018-08-27 DIAGNOSIS — Z992 Dependence on renal dialysis: Secondary | ICD-10-CM | POA: Diagnosis not present

## 2018-08-28 DIAGNOSIS — D631 Anemia in chronic kidney disease: Secondary | ICD-10-CM | POA: Diagnosis not present

## 2018-08-28 DIAGNOSIS — E1129 Type 2 diabetes mellitus with other diabetic kidney complication: Secondary | ICD-10-CM | POA: Diagnosis not present

## 2018-08-28 DIAGNOSIS — N186 End stage renal disease: Secondary | ICD-10-CM | POA: Diagnosis not present

## 2018-08-28 DIAGNOSIS — D509 Iron deficiency anemia, unspecified: Secondary | ICD-10-CM | POA: Diagnosis not present

## 2018-08-28 DIAGNOSIS — N2581 Secondary hyperparathyroidism of renal origin: Secondary | ICD-10-CM | POA: Diagnosis not present

## 2018-08-29 ENCOUNTER — Encounter: Payer: Self-pay | Admitting: Podiatry

## 2018-08-29 ENCOUNTER — Ambulatory Visit (INDEPENDENT_AMBULATORY_CARE_PROVIDER_SITE_OTHER): Payer: Medicare Other | Admitting: Podiatry

## 2018-08-29 DIAGNOSIS — E1149 Type 2 diabetes mellitus with other diabetic neurological complication: Secondary | ICD-10-CM

## 2018-08-29 DIAGNOSIS — Q828 Other specified congenital malformations of skin: Secondary | ICD-10-CM

## 2018-08-29 DIAGNOSIS — M79674 Pain in right toe(s): Secondary | ICD-10-CM | POA: Diagnosis not present

## 2018-08-29 DIAGNOSIS — M79675 Pain in left toe(s): Secondary | ICD-10-CM

## 2018-08-29 DIAGNOSIS — B351 Tinea unguium: Secondary | ICD-10-CM

## 2018-08-29 NOTE — Progress Notes (Signed)
Subjective: 57 y.o. returns the office today for painful, elongated, thickened toenails which she cannot trim herself as well as for calluses to her heels. Denies any redness or drainage around the nails/calluses. Denies any acute changes since last appointment and no new complaints today. Denies any systemic complaints such as fevers, chills, nausea, vomiting.   Objective: AAO 3, NAD- uses oxygen  DP/PT pulses palpable, CRT less than 3 seconds Protective sensation decreased with Simms Weinstein monofilament Nails hypertrophic, dystrophic, elongated, brittle, discolored 10. There is tenderness overlying the nails 1-5 bilaterally. There is no surrounding erythema or drainage along the nail sites. Hyperkeratotic lesions to the left right plantar heels. Upon debridement there is no underlying ulceration, drainage or any signs of infection.  Overall these appear to be improved. No open lesions or pre-ulcerative lesions are identified. No other areas of tenderness bilateral lower extremities. No overlying edema, erythema, increased warmth. No pain with calf compression, swelling, warmth, erythema.  Assessment: Patient presents with symptomatic onychomycosis, calluses 2  Plan: -Treatment options including alternatives, risks, complications were discussed -Nails sharply debrided 10 without complication/bleeding. -Hyperkeratotic lesion sharply debrided 2 without complications or bleeding. -Discussed daily foot inspection. If there are any changes, to call the office immediately.  -Follow-up in 3 months or sooner if any problems are to arise. In the meantime, encouraged to call the office with any questions, concerns, changes symptoms.  Celesta Gentile, DPM

## 2018-08-30 DIAGNOSIS — E1129 Type 2 diabetes mellitus with other diabetic kidney complication: Secondary | ICD-10-CM | POA: Diagnosis not present

## 2018-08-30 DIAGNOSIS — N2581 Secondary hyperparathyroidism of renal origin: Secondary | ICD-10-CM | POA: Diagnosis not present

## 2018-08-30 DIAGNOSIS — D631 Anemia in chronic kidney disease: Secondary | ICD-10-CM | POA: Diagnosis not present

## 2018-08-30 DIAGNOSIS — N186 End stage renal disease: Secondary | ICD-10-CM | POA: Diagnosis not present

## 2018-08-30 DIAGNOSIS — D509 Iron deficiency anemia, unspecified: Secondary | ICD-10-CM | POA: Diagnosis not present

## 2018-09-02 DIAGNOSIS — D509 Iron deficiency anemia, unspecified: Secondary | ICD-10-CM | POA: Diagnosis not present

## 2018-09-02 DIAGNOSIS — N186 End stage renal disease: Secondary | ICD-10-CM | POA: Diagnosis not present

## 2018-09-02 DIAGNOSIS — N2581 Secondary hyperparathyroidism of renal origin: Secondary | ICD-10-CM | POA: Diagnosis not present

## 2018-09-02 DIAGNOSIS — D631 Anemia in chronic kidney disease: Secondary | ICD-10-CM | POA: Diagnosis not present

## 2018-09-02 DIAGNOSIS — E1129 Type 2 diabetes mellitus with other diabetic kidney complication: Secondary | ICD-10-CM | POA: Diagnosis not present

## 2018-09-04 DIAGNOSIS — N2581 Secondary hyperparathyroidism of renal origin: Secondary | ICD-10-CM | POA: Diagnosis not present

## 2018-09-04 DIAGNOSIS — N186 End stage renal disease: Secondary | ICD-10-CM | POA: Diagnosis not present

## 2018-09-04 DIAGNOSIS — D631 Anemia in chronic kidney disease: Secondary | ICD-10-CM | POA: Diagnosis not present

## 2018-09-04 DIAGNOSIS — E1129 Type 2 diabetes mellitus with other diabetic kidney complication: Secondary | ICD-10-CM | POA: Diagnosis not present

## 2018-09-04 DIAGNOSIS — D509 Iron deficiency anemia, unspecified: Secondary | ICD-10-CM | POA: Diagnosis not present

## 2018-09-06 DIAGNOSIS — N2581 Secondary hyperparathyroidism of renal origin: Secondary | ICD-10-CM | POA: Diagnosis not present

## 2018-09-06 DIAGNOSIS — E1129 Type 2 diabetes mellitus with other diabetic kidney complication: Secondary | ICD-10-CM | POA: Diagnosis not present

## 2018-09-06 DIAGNOSIS — N186 End stage renal disease: Secondary | ICD-10-CM | POA: Diagnosis not present

## 2018-09-06 DIAGNOSIS — D509 Iron deficiency anemia, unspecified: Secondary | ICD-10-CM | POA: Diagnosis not present

## 2018-09-06 DIAGNOSIS — D631 Anemia in chronic kidney disease: Secondary | ICD-10-CM | POA: Diagnosis not present

## 2018-09-09 DIAGNOSIS — N2581 Secondary hyperparathyroidism of renal origin: Secondary | ICD-10-CM | POA: Diagnosis not present

## 2018-09-09 DIAGNOSIS — N186 End stage renal disease: Secondary | ICD-10-CM | POA: Diagnosis not present

## 2018-09-09 DIAGNOSIS — D631 Anemia in chronic kidney disease: Secondary | ICD-10-CM | POA: Diagnosis not present

## 2018-09-09 DIAGNOSIS — D509 Iron deficiency anemia, unspecified: Secondary | ICD-10-CM | POA: Diagnosis not present

## 2018-09-09 DIAGNOSIS — E1129 Type 2 diabetes mellitus with other diabetic kidney complication: Secondary | ICD-10-CM | POA: Diagnosis not present

## 2018-09-11 ENCOUNTER — Other Ambulatory Visit: Payer: Self-pay | Admitting: Infectious Disease

## 2018-09-11 DIAGNOSIS — E1129 Type 2 diabetes mellitus with other diabetic kidney complication: Secondary | ICD-10-CM | POA: Diagnosis not present

## 2018-09-11 DIAGNOSIS — N186 End stage renal disease: Secondary | ICD-10-CM | POA: Diagnosis not present

## 2018-09-11 DIAGNOSIS — D631 Anemia in chronic kidney disease: Secondary | ICD-10-CM | POA: Diagnosis not present

## 2018-09-11 DIAGNOSIS — N2581 Secondary hyperparathyroidism of renal origin: Secondary | ICD-10-CM | POA: Diagnosis not present

## 2018-09-11 DIAGNOSIS — D509 Iron deficiency anemia, unspecified: Secondary | ICD-10-CM | POA: Diagnosis not present

## 2018-09-13 DIAGNOSIS — D631 Anemia in chronic kidney disease: Secondary | ICD-10-CM | POA: Diagnosis not present

## 2018-09-13 DIAGNOSIS — N186 End stage renal disease: Secondary | ICD-10-CM | POA: Diagnosis not present

## 2018-09-13 DIAGNOSIS — E1129 Type 2 diabetes mellitus with other diabetic kidney complication: Secondary | ICD-10-CM | POA: Diagnosis not present

## 2018-09-13 DIAGNOSIS — D509 Iron deficiency anemia, unspecified: Secondary | ICD-10-CM | POA: Diagnosis not present

## 2018-09-13 DIAGNOSIS — N2581 Secondary hyperparathyroidism of renal origin: Secondary | ICD-10-CM | POA: Diagnosis not present

## 2018-09-14 ENCOUNTER — Other Ambulatory Visit: Payer: Self-pay

## 2018-09-14 ENCOUNTER — Emergency Department (HOSPITAL_COMMUNITY)
Admission: EM | Admit: 2018-09-14 | Discharge: 2018-09-14 | Disposition: A | Discharge status: Home / Self Care | Payer: Medicare Other | Source: Home / Self Care | Attending: Emergency Medicine | Admitting: Emergency Medicine

## 2018-09-14 ENCOUNTER — Encounter (HOSPITAL_COMMUNITY): Payer: Self-pay | Admitting: Emergency Medicine

## 2018-09-14 DIAGNOSIS — Z79899 Other long term (current) drug therapy: Secondary | ICD-10-CM | POA: Insufficient documentation

## 2018-09-14 DIAGNOSIS — Z6841 Body Mass Index (BMI) 40.0 and over, adult: Secondary | ICD-10-CM | POA: Diagnosis not present

## 2018-09-14 DIAGNOSIS — N186 End stage renal disease: Secondary | ICD-10-CM | POA: Insufficient documentation

## 2018-09-14 DIAGNOSIS — I5032 Chronic diastolic (congestive) heart failure: Secondary | ICD-10-CM | POA: Insufficient documentation

## 2018-09-14 DIAGNOSIS — E119 Type 2 diabetes mellitus without complications: Secondary | ICD-10-CM | POA: Insufficient documentation

## 2018-09-14 DIAGNOSIS — Z992 Dependence on renal dialysis: Secondary | ICD-10-CM | POA: Insufficient documentation

## 2018-09-14 DIAGNOSIS — R000 Tachycardia, unspecified: Secondary | ICD-10-CM | POA: Diagnosis not present

## 2018-09-14 DIAGNOSIS — Y658 Other specified misadventures during surgical and medical care: Secondary | ICD-10-CM | POA: Insufficient documentation

## 2018-09-14 DIAGNOSIS — I471 Supraventricular tachycardia: Secondary | ICD-10-CM | POA: Diagnosis not present

## 2018-09-14 DIAGNOSIS — D62 Acute posthemorrhagic anemia: Secondary | ICD-10-CM | POA: Diagnosis not present

## 2018-09-14 DIAGNOSIS — Z7984 Long term (current) use of oral hypoglycemic drugs: Secondary | ICD-10-CM

## 2018-09-14 DIAGNOSIS — Z87891 Personal history of nicotine dependence: Secondary | ICD-10-CM

## 2018-09-14 DIAGNOSIS — D649 Anemia, unspecified: Secondary | ICD-10-CM | POA: Diagnosis not present

## 2018-09-14 DIAGNOSIS — T82838A Hemorrhage of vascular prosthetic devices, implants and grafts, initial encounter: Secondary | ICD-10-CM | POA: Diagnosis not present

## 2018-09-14 DIAGNOSIS — J9811 Atelectasis: Secondary | ICD-10-CM | POA: Diagnosis not present

## 2018-09-14 DIAGNOSIS — I132 Hypertensive heart and chronic kidney disease with heart failure and with stage 5 chronic kidney disease, or end stage renal disease: Secondary | ICD-10-CM | POA: Insufficient documentation

## 2018-09-14 DIAGNOSIS — E039 Hypothyroidism, unspecified: Secondary | ICD-10-CM | POA: Insufficient documentation

## 2018-09-14 DIAGNOSIS — I420 Dilated cardiomyopathy: Secondary | ICD-10-CM | POA: Diagnosis not present

## 2018-09-14 DIAGNOSIS — Z7982 Long term (current) use of aspirin: Secondary | ICD-10-CM

## 2018-09-14 DIAGNOSIS — R58 Hemorrhage, not elsewhere classified: Secondary | ICD-10-CM | POA: Diagnosis not present

## 2018-09-14 LAB — CBC WITH DIFFERENTIAL/PLATELET
Abs Immature Granulocytes: 0.13 10*3/uL — ABNORMAL HIGH (ref 0.00–0.07)
BASOS PCT: 0 %
Basophils Absolute: 0.1 10*3/uL (ref 0.0–0.1)
EOS ABS: 0.6 10*3/uL — AB (ref 0.0–0.5)
Eosinophils Relative: 4 %
HEMATOCRIT: 31.7 % — AB (ref 36.0–46.0)
Hemoglobin: 8.7 g/dL — ABNORMAL LOW (ref 12.0–15.0)
IMMATURE GRANULOCYTES: 1 %
LYMPHS ABS: 1.2 10*3/uL (ref 0.7–4.0)
Lymphocytes Relative: 7 %
MCH: 27.9 pg (ref 26.0–34.0)
MCHC: 27.4 g/dL — ABNORMAL LOW (ref 30.0–36.0)
MCV: 101.6 fL — AB (ref 80.0–100.0)
MONO ABS: 0.9 10*3/uL (ref 0.1–1.0)
MONOS PCT: 5 %
NEUTROS PCT: 83 %
Neutro Abs: 13.7 10*3/uL — ABNORMAL HIGH (ref 1.7–7.7)
Platelets: 266 10*3/uL (ref 150–400)
RBC: 3.12 MIL/uL — ABNORMAL LOW (ref 3.87–5.11)
RDW: 17.7 % — ABNORMAL HIGH (ref 11.5–15.5)
WBC: 16.5 10*3/uL — ABNORMAL HIGH (ref 4.0–10.5)
nRBC: 0 % (ref 0.0–0.2)

## 2018-09-14 LAB — BASIC METABOLIC PANEL
ANION GAP: 11 (ref 5–15)
BUN: 37 mg/dL — AB (ref 6–20)
CO2: 26 mmol/L (ref 22–32)
Calcium: 8.9 mg/dL (ref 8.9–10.3)
Chloride: 96 mmol/L — ABNORMAL LOW (ref 98–111)
Creatinine, Ser: 5.8 mg/dL — ABNORMAL HIGH (ref 0.44–1.00)
GFR calc Af Amer: 9 mL/min — ABNORMAL LOW (ref >60)
GFR calc non Af Amer: 7 mL/min — ABNORMAL LOW (ref >60)
GLUCOSE: 176 mg/dL — AB (ref 70–99)
POTASSIUM: 3.8 mmol/L (ref 3.5–5.1)
Sodium: 133 mmol/L — ABNORMAL LOW (ref 135–145)

## 2018-09-14 NOTE — ED Provider Notes (Signed)
[facility] EMERGENCY DEPARTMENT Provider Note   CSN: [id number] Arrival date & time: 09/14/18  0515     History   Chief Complaint Chief Complaint  Patient presents with  . Bleeding Fistula    HPI [name] is a 57 y.o. female.  Patient is a 57 year old female with past medical history of end-stage renal disease on hemodialysis, cardiomyopathy, CHF, hypertension.  She presents today for bleeding from her dialysis fistula.  She had her normal dialysis yesterday, she took the dressing off early this morning and began having profuse bleeding from the puncture site.  She applied direct pressure with little relief.  EMS was called and a tourniquet was applied as well as manual pressure.  The history is provided by the patient.    Past Medical History:  Diagnosis Date  . Chronic diastolic CHF (congestive heart failure) ([place])   . Chronic kidney disease (CKD), stage III (moderate) ([place])    [name] nephrology lov note 05-12-2013 on pt chart now on HD  . DCM (dilated cardiomyopathy) ([place])    nonischemic - EF now 60-65% by echo 2017  . Depression   . DM type 2 (diabetes mellitus, type 2) ([facility])    diet controlledwith retinopathy and nephropathy  . ESRD on hemodialysis ([place]) 08/21/2017  . GERD (gastroesophageal reflux disease)   . H/O cardiac arrest 10/2012  . Hard of hearing 10/2012   now wears 2 hearing aids  . History of hemodialysis dec 2013  . HTN (hypertension)   . Hydradenitis   . Hyperkalemia   . Hyperlipidemia   . Hypothyroidism   . Iron deficiency anemia   . Morbid obesity ([place])   . Multinodular goiter   . Neuropathic pain 08/21/2017  . Orthostatic hypotension 06/27/2018  . Pneumonia Nov 10, 2012  . Sleep apnea    Intolerant to CPAP  . Tinnitus 12/26/2016  . Typical atrial flutter ([place]) 04/10/2017  . Urinary tract infection    taking antibiotics for 3 days prior to surgery    Patient Active Problem List   Diagnosis Date Noted  . Orthostatic  hypotension 06/27/2018  . Neuropathic pain 08/21/2017  . Typical atrial flutter ([place]) 04/10/2017  . Persistent atrial fibrillation 02/04/2017  . Tinnitus 12/26/2016  . Streptococcal bacteremia   . ESRD (end stage renal disease) on dialysis ([place]) 08/25/2016  . Absolute anemia   . Pressure ulcer 05/01/2015  . Chronic respiratory failure ([place]) 11/10/2013  . COPD (chronic obstructive pulmonary disease) ([place]) 11/06/2013  . DCM (dilated cardiomyopathy) ([place])   . OSA (obstructive sleep apnea) 07/17/2013  . Chronic diastolic heart failure ([place]) 07/16/2013  . SOB (shortness of breath) 07/11/2013  . Hypertension 11/30/2012  . Hydronephrosis of left kidney 05/28/2012  . Nephrolithiasis 05/28/2012  . Hidradenitis suppurativa 05/21/2012  . Symptomatic anemia 05/21/2012  . DM type 2 (diabetes mellitus, type 2) ([place])   . Mood disorder in conditions classified elsewhere 09/06/2010  . ERYTHEMA NODOSUM, HX OF 07/27/2010  . GOITER, MULTINODULAR 05/18/2010  . Morbid obesity ([place]) 05/18/2010  . GERD 05/18/2010    Past Surgical History:  Procedure Laterality Date  . AV FISTULA PLACEMENT Left 07/25/2013   Procedure: ARTERIOVENOUS (AV) FISTULA CREATION ;  Surgeon: Rosetta Posner, MD;  Location: [facility];  Service: Vascular;  Laterality: Left;  . CYSTOSCOPY W/ RETROGRADES  11/16/2012   Procedure: CYSTOSCOPY WITH RETROGRADE PYELOGRAM;  Surgeon: Reece Packer, MD;  Location: [facility];  Service: Urology;  Laterality: Bilateral;  CYSTOSCOPY,BILATERAL RETROGRADE PYELOGRAM/ REMOVAL  LEFT URETERAL STENT/ FULGERATION BLADDER MUCOSA/ INSERTION RIGHT URETERAL STENT  . CYSTOSCOPY W/ URETERAL STENT PLACEMENT  05/28/2012   Procedure: CYSTOSCOPY WITH RETROGRADE PYELOGRAM/URETERAL STENT PLACEMENT;  Surgeon: Reece Packer, MD;  Location: [facility];  Service: Urology;  Laterality: Left;  . CYSTOSCOPY WITH RETROGRADE PYELOGRAM, URETEROSCOPY AND STENT PLACEMENT Right 06/23/2013   Procedure: CYSTOSCOPY WITH RIGHT URETEROSCOPY,  RIGHT  RETROGRADE PYELOGRAM, WITH LASER LIPOTRIPSY AND RIGHT URETERAL STENT EXCHANGE ;  Surgeon: Molli Hazard, MD;  Location: [facility];  Service: Urology;  Laterality: Right;  STENT EXCHANGE    . CYSTOSCOPY WITH RETROGRADE PYELOGRAM, URETEROSCOPY AND STENT PLACEMENT Right 11/06/2013   Procedure: CYSTOSCOPY WITH RETROGRADE PYELOGRAM, URETEROSCOPY STONE REMOVAL AND STENT REMOVAL;  Surgeon: Molli Hazard, MD;  Location: [facility];  Service: Urology;  Laterality: Right;  . HOLMIUM LASER APPLICATION N/A 05/18/2978   Procedure: HOLMIUM LASER APPLICATION;  Surgeon: Molli Hazard, MD;  Location: [facility];  Service: Urology;  Laterality: N/A;  . INSERTION OF DIALYSIS CATHETER N/A 09/24/2013   Procedure: INSERTION OF DIALYSIS CATHETER;  Surgeon: Rosetta Posner, MD;  [facility];  Service: Vascular;  Laterality: N/A;  . IR AV DIALY SHUNT INTRO NEEDLE/INTRAC INITIAL W/PTA/STENT/IMG LT Left 08/13/2018  . IR AV DIALY SHUNT INTRO NEEDLE/INTRACATH INITIAL W/PTA/IMG LEFT  04/19/2018  . IR AV DIALY SHUNT INTRO NEEDLE/INTRACATH INITIAL W/PTA/IMG LEFT  08/14/2018  . IR US GUIDE VASC ACCESS LEFT  04/19/2018  . IR US GUIDE VASC ACCESS LEFT  08/13/2018  . IR US GUIDE VASC ACCESS LEFT  08/14/2018  . PORT-A-CATH REMOVAL    . PORTACATH PLACEMENT    . REVISON OF ARTERIOVENOUS FISTULA Left 07/25/2016   Procedure: REVISON OF LEFT ARTERIOVENOUS FISTULA;  Surgeon: Elam Dutch, MD;  Location: [facility] OR;  Service: Vascular;  Laterality: Left;     OB History   None      Home Medications    Prior to Admission medications   Medication Sig Start Date End Date Taking? Authorizing Provider  acetaminophen (TYLENOL) 500 MG tablet Take 1,000 mg by mouth daily as needed for moderate pain.    [provider]  aspirin EC 81 MG tablet Take 81 mg by mouth at bedtime.     [provider]  Calcium Acetate 667 MG TABS Take 667 mg by mouth 3 (three) times daily with meals. And snacks    [provider]  ciprofloxacin (CIPRO) 500 MG tablet Take 1 tablet (500 mg total) by mouth daily. 05/28/18   Truman Hayward, MD  [place] (SODIUM HYPOCHLORITE) 0.25 % SOLN Apply 1 application topically 3 (three) times daily.    [provider]  diltiazem (CARDIZEM) 30 MG tablet Take 1 tablet every 4 hours AS NEEDED for rapid heart rate lasting more than 30 mins as long as top blood pressure >100. 03/09/17   Sherran Needs, NP  diphenhydrAMINE (BENADRYL) 25 MG tablet Take 25 mg by mouth daily as needed for allergies.    [provider]  doxycycline (VIBRA-TABS) 100 MG tablet Take 1 tablet (100 mg total) by mouth 2 (two) times daily. 05/28/18   Truman Hayward, MD  DULoxetine (CYMBALTA) 60 MG capsule Take 1 capsule (60 mg total) by mouth daily. 12/03/12   Janece Canterbury, MD  DYMISTA 137-50 MCG/ACT SUSP PLACE 2 SPRAYS INTO THE NOSE DAILY. Patient taking differently: Place 2 sprays into both nostrils daily as needed (allergies).  02/06/18   Marshell Garfinkel, MD  famotidine (PEPCID)  20 MG tablet TAKE 1 TABLET BY MOUTH AT BEDTIME Patient taking differently: Take 20 mg by mouth at bedtime 10/15/14   Tanda Rockers, MD  feeding supplement, RESOURCE BREEZE, (RESOURCE BREEZE) LIQD Take 1 Container by mouth 2 (two) times daily between meals.     [provider]  glipiZIDE (GLUCOTROL) 5 MG tablet Take 0.5 tablets (2.5 mg total) by mouth daily. 08/31/16   Debbe Odea, MD  levothyroxine (SYNTHROID, LEVOTHROID) 75 MCG tablet Take 75 mcg by mouth daily. 08/17/15   [provider]  midodrine (PROAMATINE) 10 MG tablet Take 1 tablet (10 mg total) by mouth 3 (three) times daily. Patient taking differently: Take 10 mg by mouth every Monday, Wednesday, and Friday. Dialysis days 08/31/16   Debbe Odea, MD  multivitamin (RENA-VIT) TABS tablet Take 1 tablet by mouth daily. 08/16/15   [provider]  oxyCODONE-acetaminophen (PERCOCET/ROXICET) 5-325 MG tablet Take 1-2 tablets  by mouth every 6 (six) hours as needed. Patient has not taken since started dialysis 08/2013 due to causes hypotension Patient taking differently: Take 2 tablets by mouth every 6 (six) hours as needed for moderate pain.  07/25/16   Alvia Grove, PA-C  pravastatin (PRAVACHOL) 40 MG tablet Take 40 mg by mouth at bedtime.  12/03/12   Janece Canterbury, MD  Probiotic Product (PROBIOTIC DAILY PO) Take 1 tablet by mouth 2 (two) times daily.     [provider]  sevelamer carbonate (RENVELA) 800 MG tablet Take 2,400 mg by mouth 3 (three) times daily with meals.    [provider]  sucroferric oxyhydroxide (VELPHORO) 500 MG chewable tablet Chew 1,000 mg by mouth 3 (three) times daily with meals.     [provider]    Family History Family History  Problem Relation Age of Onset  . Coronary artery disease Mother   . Hypertension Mother   . Diabetes type II Mother   . Coronary artery disease Father   . Hypertension Father   . Malignant hyperthermia Father   . Cancer Maternal Grandfather        ? Type  . Kidney failure Maternal Grandmother     Social History Social History   Tobacco Use  . Smoking status: Former Smoker    Packs/day: 0.25    Years: 1.00    Pack years: 0.25    Types: Cigarettes    Last attempt to quit: 11/28/1979    Years since quitting: 38.8  . Smokeless tobacco: Never Used  Substance Use Topics  . Alcohol use: No  . Drug use: No     Allergies   Rosiglitazone; Amoxicillin; and Amoxicillin-pot clavulanate   Review of Systems Review of Systems  All other systems reviewed and are negative.    Physical Exam Updated Vital Signs LMP 06/13/2013   Physical Exam  Constitutional: She is oriented to person, place, and time. She appears well-developed and well-nourished. No distress.  HENT:  Head: Normocephalic and atraumatic.  Neck: Normal range of motion. Neck supple.  Pulmonary/Chest: Effort normal.  Musculoskeletal:  There is a  dialysis fistula in the left upper arm with active bleeding.   Neurological: She is alert and oriented to person, place, and time.  Skin: Skin is warm and dry. She is not diaphoretic.  Nursing note and vitals reviewed.    ED Treatments / Results  Labs (all labs ordered are listed, but only abnormal results are displayed) Labs Reviewed - No data to display  EKG None  Radiology No results found.  Procedures Procedures (including critical care time)  Medications Ordered in ED Medications - No data to display   Initial Impression / Assessment and Plan / ED Course  I have reviewed the triage vital signs and the nursing notes.  Pertinent labs & imaging results that were available during my care of the patient were reviewed by me and considered in my medical decision making (see chart for details).  Patient with bleeding dialysis fistula.  A transexemic acid and combat gauze dressing was applied with good hemostasis.  Patient complained the dressing felt too tight, so the Covan was removed and an Ace bandage was applied in its place.  This was more comfortable.  Patient will be discharged pending laboratory studies.  Final Clinical Impressions(s) / ED Diagnoses   Final diagnoses:  None    ED Discharge Orders    None       Veryl Speak, MD 09/14/18 [phone]

## 2018-09-14 NOTE — ED Notes (Signed)
EDP at bedside. Tourniquet removed.  Pressure dressing with TXA and quick clot applied. Bleeding controlled at this time.

## 2018-09-14 NOTE — Discharge Instructions (Signed)
Leave dressing in place for at least 24 hours before removing.  If bleeding recurs, apply direct pressure and return to the emergency department.

## 2018-09-14 NOTE — ED Notes (Signed)
Pt verbalized understanding of d/c instructions and has no further questions. Ace bandage in place on arm, no bleeding noted. Pt educated to leave bandage in place for 24 hours and to apply direct pressure and return to ER if bleeding occurs again. VSS. NAD.

## 2018-09-14 NOTE — ED Triage Notes (Signed)
Pt had dialysis on Friday. WHen she removed the wrap from around her access it began to bleed uncontrollably. Fire applied a tourniquet.  EDP to bedside.

## 2018-09-14 NOTE — ED Notes (Signed)
[place] bandage removed, bleeding appears to be controlled; bandage replaced with ace bandage

## 2018-09-17 ENCOUNTER — Other Ambulatory Visit: Payer: Self-pay

## 2018-09-17 ENCOUNTER — Encounter (HOSPITAL_COMMUNITY): Payer: Self-pay

## 2018-09-17 ENCOUNTER — Ambulatory Visit (HOSPITAL_COMMUNITY)
Admission: RE | Admit: 2018-09-17 | Discharge: 2018-09-17 | Disposition: A | Discharge status: Home / Self Care | Payer: Medicare Other | Source: Ambulatory Visit | Attending: Nephrology | Admitting: Nephrology

## 2018-09-17 ENCOUNTER — Other Ambulatory Visit (HOSPITAL_COMMUNITY): Payer: Self-pay | Admitting: Nephrology

## 2018-09-17 ENCOUNTER — Other Ambulatory Visit (HOSPITAL_BASED_OUTPATIENT_CLINIC_OR_DEPARTMENT_OTHER): Payer: Self-pay

## 2018-09-17 ENCOUNTER — Emergency Department (HOSPITAL_COMMUNITY): Payer: Medicare Other

## 2018-09-17 ENCOUNTER — Inpatient Hospital Stay (HOSPITAL_COMMUNITY)
Admission: EM | Admit: 2018-09-17 | Discharge: 2018-09-20 | DRG: 811 | Disposition: A | Discharge status: Home / Self Care | Payer: Medicare Other | Attending: Internal Medicine | Admitting: Internal Medicine

## 2018-09-17 DIAGNOSIS — Z8249 Family history of ischemic heart disease and other diseases of the circulatory system: Secondary | ICD-10-CM

## 2018-09-17 DIAGNOSIS — E039 Hypothyroidism, unspecified: Secondary | ICD-10-CM | POA: Insufficient documentation

## 2018-09-17 DIAGNOSIS — I420 Dilated cardiomyopathy: Secondary | ICD-10-CM | POA: Diagnosis present

## 2018-09-17 DIAGNOSIS — T82818A Embolism of vascular prosthetic devices, implants and grafts, initial encounter: Secondary | ICD-10-CM | POA: Diagnosis present

## 2018-09-17 DIAGNOSIS — Z4901 Encounter for fitting and adjustment of extracorporeal dialysis catheter: Secondary | ICD-10-CM | POA: Diagnosis not present

## 2018-09-17 DIAGNOSIS — Z452 Encounter for adjustment and management of vascular access device: Secondary | ICD-10-CM | POA: Diagnosis not present

## 2018-09-17 DIAGNOSIS — I5032 Chronic diastolic (congestive) heart failure: Secondary | ICD-10-CM | POA: Diagnosis present

## 2018-09-17 DIAGNOSIS — K219 Gastro-esophageal reflux disease without esophagitis: Secondary | ICD-10-CM | POA: Insufficient documentation

## 2018-09-17 DIAGNOSIS — Z992 Dependence on renal dialysis: Secondary | ICD-10-CM | POA: Diagnosis not present

## 2018-09-17 DIAGNOSIS — Z6841 Body Mass Index (BMI) 40.0 and over, adult: Secondary | ICD-10-CM

## 2018-09-17 DIAGNOSIS — N2581 Secondary hyperparathyroidism of renal origin: Secondary | ICD-10-CM | POA: Diagnosis present

## 2018-09-17 DIAGNOSIS — Z881 Allergy status to other antibiotic agents status: Secondary | ICD-10-CM

## 2018-09-17 DIAGNOSIS — T82510A Breakdown (mechanical) of surgically created arteriovenous fistula, initial encounter: Secondary | ICD-10-CM | POA: Diagnosis present

## 2018-09-17 DIAGNOSIS — Z7982 Long term (current) use of aspirin: Secondary | ICD-10-CM

## 2018-09-17 DIAGNOSIS — Z79890 Hormone replacement therapy (postmenopausal): Secondary | ICD-10-CM

## 2018-09-17 DIAGNOSIS — N186 End stage renal disease: Secondary | ICD-10-CM

## 2018-09-17 DIAGNOSIS — Z8674 Personal history of sudden cardiac arrest: Secondary | ICD-10-CM | POA: Insufficient documentation

## 2018-09-17 DIAGNOSIS — I120 Hypertensive chronic kidney disease with stage 5 chronic kidney disease or end stage renal disease: Secondary | ICD-10-CM | POA: Diagnosis not present

## 2018-09-17 DIAGNOSIS — E785 Hyperlipidemia, unspecified: Secondary | ICD-10-CM

## 2018-09-17 DIAGNOSIS — Y832 Surgical operation with anastomosis, bypass or graft as the cause of abnormal reaction of the patient, or of later complication, without mention of misadventure at the time of the procedure: Secondary | ICD-10-CM | POA: Insufficient documentation

## 2018-09-17 DIAGNOSIS — T82868A Thrombosis of vascular prosthetic devices, implants and grafts, initial encounter: Secondary | ICD-10-CM

## 2018-09-17 DIAGNOSIS — I480 Paroxysmal atrial fibrillation: Secondary | ICD-10-CM | POA: Diagnosis present

## 2018-09-17 DIAGNOSIS — F329 Major depressive disorder, single episode, unspecified: Secondary | ICD-10-CM | POA: Diagnosis present

## 2018-09-17 DIAGNOSIS — D62 Acute posthemorrhagic anemia: Principal | ICD-10-CM | POA: Diagnosis present

## 2018-09-17 DIAGNOSIS — Z9119 Patient's noncompliance with other medical treatment and regimen: Secondary | ICD-10-CM

## 2018-09-17 DIAGNOSIS — Z888 Allergy status to other drugs, medicaments and biological substances status: Secondary | ICD-10-CM

## 2018-09-17 DIAGNOSIS — D72829 Elevated white blood cell count, unspecified: Secondary | ICD-10-CM | POA: Diagnosis present

## 2018-09-17 DIAGNOSIS — H9190 Unspecified hearing loss, unspecified ear: Secondary | ICD-10-CM | POA: Diagnosis present

## 2018-09-17 DIAGNOSIS — D638 Anemia in other chronic diseases classified elsewhere: Secondary | ICD-10-CM | POA: Diagnosis present

## 2018-09-17 DIAGNOSIS — I483 Typical atrial flutter: Secondary | ICD-10-CM | POA: Diagnosis present

## 2018-09-17 DIAGNOSIS — Z7984 Long term (current) use of oral hypoglycemic drugs: Secondary | ICD-10-CM | POA: Insufficient documentation

## 2018-09-17 DIAGNOSIS — D649 Anemia, unspecified: Secondary | ICD-10-CM | POA: Diagnosis not present

## 2018-09-17 DIAGNOSIS — I132 Hypertensive heart and chronic kidney disease with heart failure and with stage 5 chronic kidney disease, or end stage renal disease: Secondary | ICD-10-CM | POA: Diagnosis not present

## 2018-09-17 DIAGNOSIS — Y712 Prosthetic and other implants, materials and accessory cardiovascular devices associated with adverse incidents: Secondary | ICD-10-CM | POA: Diagnosis present

## 2018-09-17 DIAGNOSIS — J9811 Atelectasis: Secondary | ICD-10-CM | POA: Diagnosis not present

## 2018-09-17 DIAGNOSIS — Z539 Procedure and treatment not carried out, unspecified reason: Secondary | ICD-10-CM | POA: Diagnosis present

## 2018-09-17 DIAGNOSIS — E8889 Other specified metabolic disorders: Secondary | ICD-10-CM | POA: Diagnosis present

## 2018-09-17 DIAGNOSIS — T8249XA Other complication of vascular dialysis catheter, initial encounter: Secondary | ICD-10-CM

## 2018-09-17 DIAGNOSIS — I471 Supraventricular tachycardia: Secondary | ICD-10-CM | POA: Diagnosis present

## 2018-09-17 DIAGNOSIS — E119 Type 2 diabetes mellitus without complications: Secondary | ICD-10-CM

## 2018-09-17 DIAGNOSIS — G4730 Sleep apnea, unspecified: Secondary | ICD-10-CM | POA: Diagnosis present

## 2018-09-17 DIAGNOSIS — D631 Anemia in chronic kidney disease: Secondary | ICD-10-CM | POA: Diagnosis not present

## 2018-09-17 DIAGNOSIS — I953 Hypotension of hemodialysis: Secondary | ICD-10-CM | POA: Diagnosis present

## 2018-09-17 DIAGNOSIS — R000 Tachycardia, unspecified: Secondary | ICD-10-CM | POA: Diagnosis not present

## 2018-09-17 DIAGNOSIS — Z880 Allergy status to penicillin: Secondary | ICD-10-CM | POA: Insufficient documentation

## 2018-09-17 DIAGNOSIS — E875 Hyperkalemia: Secondary | ICD-10-CM | POA: Diagnosis present

## 2018-09-17 DIAGNOSIS — I2699 Other pulmonary embolism without acute cor pulmonale: Secondary | ICD-10-CM | POA: Diagnosis not present

## 2018-09-17 DIAGNOSIS — Z87891 Personal history of nicotine dependence: Secondary | ICD-10-CM

## 2018-09-17 DIAGNOSIS — E11319 Type 2 diabetes mellitus with unspecified diabetic retinopathy without macular edema: Secondary | ICD-10-CM | POA: Diagnosis present

## 2018-09-17 DIAGNOSIS — E1122 Type 2 diabetes mellitus with diabetic chronic kidney disease: Secondary | ICD-10-CM | POA: Insufficient documentation

## 2018-09-17 DIAGNOSIS — T8249XD Other complication of vascular dialysis catheter, subsequent encounter: Secondary | ICD-10-CM | POA: Diagnosis not present

## 2018-09-17 DIAGNOSIS — Z09 Encounter for follow-up examination after completed treatment for conditions other than malignant neoplasm: Secondary | ICD-10-CM | POA: Diagnosis not present

## 2018-09-17 DIAGNOSIS — K210 Gastro-esophageal reflux disease with esophagitis: Secondary | ICD-10-CM | POA: Diagnosis not present

## 2018-09-17 DIAGNOSIS — Z833 Family history of diabetes mellitus: Secondary | ICD-10-CM

## 2018-09-17 HISTORY — PX: IR US GUIDE VASC ACCESS RIGHT: IMG2390

## 2018-09-17 HISTORY — PX: IR FLUORO GUIDE CV LINE RIGHT: IMG2283

## 2018-09-17 LAB — PREPARE RBC (CROSSMATCH)

## 2018-09-17 LAB — POC OCCULT BLOOD, ED: FECAL OCCULT BLD: NEGATIVE

## 2018-09-17 LAB — CBC
HCT: 17.7 % — ABNORMAL LOW (ref 36.0–46.0)
HEMOGLOBIN: 4.9 g/dL — AB (ref 12.0–15.0)
MCH: 28 pg (ref 26.0–34.0)
MCHC: 27.7 g/dL — AB (ref 30.0–36.0)
MCV: 101.1 fL — ABNORMAL HIGH (ref 80.0–100.0)
PLATELETS: 419 10*3/uL — AB (ref 150–400)
RBC: 1.75 MIL/uL — ABNORMAL LOW (ref 3.87–5.11)
RDW: 17.4 % — ABNORMAL HIGH (ref 11.5–15.5)
WBC: 25.7 10*3/uL — AB (ref 4.0–10.5)
nRBC: 0 % (ref 0.0–0.2)

## 2018-09-17 LAB — BASIC METABOLIC PANEL
ANION GAP: 13 (ref 5–15)
BUN: 91 mg/dL — ABNORMAL HIGH (ref 6–20)
CALCIUM: 9 mg/dL (ref 8.9–10.3)
CO2: 23 mmol/L (ref 22–32)
Chloride: 100 mmol/L (ref 98–111)
Creatinine, Ser: 12.3 mg/dL — ABNORMAL HIGH (ref 0.44–1.00)
GFR calc Af Amer: 3 mL/min — ABNORMAL LOW (ref >60)
GFR, EST NON AFRICAN AMERICAN: 3 mL/min — AB (ref >60)
GLUCOSE: 113 mg/dL — AB (ref 70–99)
Potassium: 4.8 mmol/L (ref 3.5–5.1)
Sodium: 136 mmol/L (ref 135–145)

## 2018-09-17 LAB — PROTIME-INR
INR: 1.3
Prothrombin Time: 16.1 seconds — ABNORMAL HIGH (ref 11.4–15.2)

## 2018-09-17 LAB — GLUCOSE, CAPILLARY
GLUCOSE-CAPILLARY: 141 mg/dL — AB (ref 70–99)
Glucose-Capillary: 99 mg/dL (ref 70–99)

## 2018-09-17 MED ORDER — ACETAMINOPHEN 500 MG PO TABS: 1000.0000 mg | ORAL_TABLET | Freq: Every day | ORAL | Status: DC | PRN

## 2018-09-17 MED ORDER — ASPIRIN EC 81 MG PO TBEC
81.0000 mg | DELAYED_RELEASE_TABLET | Freq: Every day | ORAL | Status: DC
  Administered 2018-09-17 – 2018-09-19 (×3): 81 mg via ORAL
  Filled 2018-09-17 (×3): qty 1

## 2018-09-17 MED ORDER — LIDOCAINE HCL 1 % IJ SOLN
INTRAMUSCULAR | Status: AC
  Filled 2018-09-17: qty 20

## 2018-09-17 MED ORDER — SODIUM CHLORIDE 0.9% FLUSH: 3.0000 mL | INTRAVENOUS | Status: DC | PRN

## 2018-09-17 MED ORDER — CALCIUM ACETATE (PHOS BINDER) 667 MG PO CAPS: 667.0000 mg | ORAL_CAPSULE | Freq: Two times a day (BID) | ORAL | Status: DC | PRN

## 2018-09-17 MED ORDER — INSULIN ASPART 100 UNIT/ML ~~LOC~~ SOLN
0.0000 [IU] | Freq: Three times a day (TID) | SUBCUTANEOUS | Status: DC
  Administered 2018-09-18: 1 [IU] via SUBCUTANEOUS
  Administered 2018-09-18 – 2018-09-19 (×3): 2 [IU] via SUBCUTANEOUS

## 2018-09-17 MED ORDER — SUCROFERRIC OXYHYDROXIDE 500 MG PO CHEW
1000.0000 mg | CHEWABLE_TABLET | Freq: Three times a day (TID) | ORAL | Status: DC
  Administered 2018-09-19 (×2): 1000 mg via ORAL
  Filled 2018-09-17 (×9): qty 2

## 2018-09-17 MED ORDER — SODIUM CHLORIDE 0.9% FLUSH
3.0000 mL | Freq: Two times a day (BID) | INTRAVENOUS | Status: DC
  Administered 2018-09-19 – 2018-09-20 (×4): 3 mL via INTRAVENOUS

## 2018-09-17 MED ORDER — ONDANSETRON HCL 4 MG/2ML IJ SOLN: 4.0000 mg | Freq: Four times a day (QID) | INTRAMUSCULAR | Status: DC | PRN

## 2018-09-17 MED ORDER — FAMOTIDINE 20 MG PO TABS
20.0000 mg | ORAL_TABLET | Freq: Every day | ORAL | Status: DC
  Administered 2018-09-17 – 2018-09-19 (×3): 20 mg via ORAL
  Filled 2018-09-17 (×3): qty 1

## 2018-09-17 MED ORDER — RISAQUAD PO CAPS
1.0000 | ORAL_CAPSULE | Freq: Two times a day (BID) | ORAL | Status: DC
  Administered 2018-09-17 – 2018-09-20 (×5): 1 via ORAL
  Filled 2018-09-17 (×5): qty 1

## 2018-09-17 MED ORDER — OXYMETAZOLINE HCL 0.05 % NA SOLN: 1.0000 | Freq: Once | NASAL | Status: DC | PRN

## 2018-09-17 MED ORDER — SODIUM CHLORIDE 0.9% IV SOLUTION
Freq: Once | INTRAVENOUS | Status: AC
  Administered 2018-09-17: 17:00:00 via INTRAVENOUS

## 2018-09-17 MED ORDER — RENA-VITE PO TABS
1.0000 | ORAL_TABLET | Freq: Every day | ORAL | Status: DC
  Administered 2018-09-17 – 2018-09-19 (×3): 1 via ORAL
  Filled 2018-09-17 (×3): qty 1

## 2018-09-17 MED ORDER — DAKINS (1/2 STRENGTH) 0.25 % EX SOLN
1.0000 "application " | Freq: Three times a day (TID) | CUTANEOUS | Status: DC
  Administered 2018-09-19 – 2018-09-20 (×3): 1 via TOPICAL
  Filled 2018-09-17 (×2): qty 473

## 2018-09-17 MED ORDER — MIDODRINE HCL 5 MG PO TABS
10.0000 mg | ORAL_TABLET | ORAL | Status: DC
  Administered 2018-09-18 – 2018-09-20 (×2): 10 mg via ORAL
  Filled 2018-09-17 (×2): qty 2

## 2018-09-17 MED ORDER — LIDOCAINE HCL (PF) 1 % IJ SOLN
INTRAMUSCULAR | Status: DC | PRN
  Administered 2018-09-17: 10 mL

## 2018-09-17 MED ORDER — DULOXETINE HCL 60 MG PO CPEP
60.0000 mg | ORAL_CAPSULE | Freq: Every day | ORAL | Status: DC
  Administered 2018-09-18 – 2018-09-20 (×3): 60 mg via ORAL
  Filled 2018-09-17 (×3): qty 1

## 2018-09-17 MED ORDER — SODIUM CHLORIDE 0.9 % IV SOLN: 250.0000 mL | INTRAVENOUS | Status: DC | PRN

## 2018-09-17 MED ORDER — PRAVASTATIN SODIUM 40 MG PO TABS
40.0000 mg | ORAL_TABLET | Freq: Every day | ORAL | Status: DC
  Administered 2018-09-17 – 2018-09-20 (×3): 40 mg via ORAL
  Filled 2018-09-17 (×2): qty 1

## 2018-09-17 MED ORDER — ONDANSETRON HCL 4 MG PO TABS: 4.0000 mg | ORAL_TABLET | Freq: Four times a day (QID) | ORAL | Status: DC | PRN

## 2018-09-17 MED ORDER — CALCIUM ACETATE (PHOS BINDER) 667 MG PO CAPS
667.0000 mg | ORAL_CAPSULE | Freq: Three times a day (TID) | ORAL | Status: DC
  Administered 2018-09-18 – 2018-09-20 (×4): 667 mg via ORAL
  Filled 2018-09-17 (×5): qty 1

## 2018-09-17 MED ORDER — CIPROFLOXACIN HCL 500 MG PO TABS
500.0000 mg | ORAL_TABLET | Freq: Every day | ORAL | Status: DC
  Administered 2018-09-18 – 2018-09-20 (×3): 500 mg via ORAL
  Filled 2018-09-17 (×3): qty 1

## 2018-09-17 MED ORDER — LEVOTHYROXINE SODIUM 75 MCG PO TABS
75.0000 ug | ORAL_TABLET | Freq: Every day | ORAL | Status: DC
  Administered 2018-09-18 – 2018-09-20 (×3): 75 ug via ORAL
  Filled 2018-09-17 (×4): qty 1

## 2018-09-17 MED ORDER — DIPHENHYDRAMINE HCL 25 MG PO CAPS: 25.0000 mg | ORAL_CAPSULE | Freq: Every day | ORAL | Status: DC | PRN

## 2018-09-17 MED ORDER — SEVELAMER CARBONATE 800 MG PO TABS
2400.0000 mg | ORAL_TABLET | Freq: Three times a day (TID) | ORAL | Status: DC
  Administered 2018-09-18 – 2018-09-20 (×4): 2400 mg via ORAL
  Filled 2018-09-17 (×5): qty 3

## 2018-09-17 MED ORDER — DILTIAZEM HCL 60 MG PO TABS
30.0000 mg | ORAL_TABLET | ORAL | Status: DC | PRN
  Administered 2018-09-19 (×2): 30 mg via ORAL
  Filled 2018-09-17: qty 1
  Filled 2018-09-17: qty 0.5
  Filled 2018-09-17: qty 1

## 2018-09-17 MED ORDER — INSULIN ASPART 100 UNIT/ML ~~LOC~~ SOLN: 0.0000 [IU] | Freq: Every day | SUBCUTANEOUS | Status: DC

## 2018-09-17 MED ORDER — SODIUM CHLORIDE 0.9 % IV SOLN: INTRAVENOUS | Status: DC

## 2018-09-17 MED ORDER — HEPARIN SODIUM (PORCINE) 1000 UNIT/ML IJ SOLN
INTRAMUSCULAR | Status: AC
  Administered 2018-09-17: 2.8 mL
  Filled 2018-09-17: qty 1

## 2018-09-17 MED ORDER — OXYCODONE-ACETAMINOPHEN 5-325 MG PO TABS
2.0000 | ORAL_TABLET | Freq: Four times a day (QID) | ORAL | Status: DC | PRN
  Administered 2018-09-19: 2 via ORAL
  Filled 2018-09-17: qty 2

## 2018-09-17 MED ORDER — GLIPIZIDE 5 MG PO TABS
2.5000 mg | ORAL_TABLET | Freq: Every day | ORAL | Status: DC
  Administered 2018-09-18: 2.5 mg via ORAL
  Filled 2018-09-17 (×2): qty 1

## 2018-09-17 MED ORDER — BOOST / RESOURCE BREEZE PO LIQD CUSTOM
1.0000 | Freq: Two times a day (BID) | ORAL | Status: DC
  Administered 2018-09-20: 1 via ORAL
  Filled 2018-09-17 (×2): qty 1

## 2018-09-17 NOTE — ED Triage Notes (Addendum)
Pt from IR due to low hgb and needing dialysis. Last session was Friday. Pt sent to IR outpatient to declot fistula, unable to have procedure done due to labs. Hgb reported to be 4.9. Temporary catheter placed in IR in Right side of neck, no sedation used due to pt eating prior to procedure. Pt in nad, alert and oriented. On 2L [place] all time.

## 2018-09-17 NOTE — Procedures (Signed)
  Procedure: R IJ Trialysis HD catheter placement to SVC/RA jct 20cm EBL:   minimal Complications:  none immediate  See full dictation in [facility].  Dillard Cannon MD Main # [phone] Pager  [phone]

## 2018-09-17 NOTE — Progress Notes (Signed)
Patient ID: [name], female   DOB: Aug 05, 1961, 57 y.o.   MRN: [id number]   Was scheduled for left arm dialysis fistula declot this afternoon as OP Labs today are abnormal Hg 4.9 (8.7  09/14/18) Wbc 25.7 (16.5  09/14/18) K 4.8 today  Dr Vernard Gambles feels not appropriate or safe to perform thrombolysis/or tunneled catheter at this time  Discussed with [name] PA for Renal team  Plan is to place temporary catheter for now Send to ED for in patient dialysis today and evaluation of high wbc and anemia  Will consider Thrombolysis or tunneled catheter placement when pt is stable--- Wbc is trending down and Hg has been addressed.  Pt is aware of plan

## 2018-09-17 NOTE — H&P (Signed)
Chief Complaint: Patient was seen in consultation today for left arm dialysis fistula thrombolysis at the request of [place]  Referring Physician(s): [place]  Supervising Physician: Arne Cleveland  Patient Status: [facility] - Out-pt  History of Present Illness: [name] is a 57 y.o. female   Last dialysis Fri -- ran fine Attempted Mon-- Clotted  Last intervention 08/14/18 in IR IMPRESSION: 1. 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 2. Centrally, the stented cephalic vein and central venous structures remain widely patent. ACCESS: Remains approachable for percutaneous intervention as needed.  Scheduled now for thrombolysis with possible angioplasty/stent placement Possible tunneled dialysis catheter placement  Denies recent CVA; denies recent surgery Denies recent injury  Past Medical History:  Diagnosis Date  . Chronic diastolic CHF (congestive heart failure) ([place])   . Chronic kidney disease (CKD), stage III (moderate) ([place])    [name] nephrology lov note 05-12-2013 on pt chart now on HD  . DCM (dilated cardiomyopathy) ([place])    nonischemic - EF now 60-65% by echo 2017  . Depression   . DM type 2 (diabetes mellitus, type 2) ([facility])    diet controlledwith retinopathy and nephropathy  . ESRD on hemodialysis ([place]) 08/21/2017  . GERD (gastroesophageal reflux disease)   . H/O cardiac arrest 10/2012  . Hard of hearing 10/2012   now wears 2 hearing aids  . History of hemodialysis dec 2013  . HTN (hypertension)   . Hydradenitis   . Hyperkalemia   . Hyperlipidemia   . Hypothyroidism   . Iron deficiency anemia   . Morbid obesity ([place])   . Multinodular goiter   . Neuropathic pain 08/21/2017  . Orthostatic hypotension 06/27/2018  . Pneumonia Nov 10, 2012  . Sleep apnea    Intolerant to CPAP  . Tinnitus 12/26/2016  . Typical atrial flutter ([place]) 04/10/2017  .  Urinary tract infection    taking antibiotics for 3 days prior to surgery    Past Surgical History:  Procedure Laterality Date  . AV FISTULA PLACEMENT Left 07/25/2013   Procedure: ARTERIOVENOUS (AV) FISTULA CREATION ;  Surgeon: Rosetta Posner, MD;  Location: [place];  Service: Vascular;  Laterality: Left;  . CYSTOSCOPY W/ RETROGRADES  11/16/2012   Procedure: CYSTOSCOPY WITH RETROGRADE PYELOGRAM;  Surgeon: Reece Packer, MD;  Location: [facility];  Service: Urology;  Laterality: Bilateral;  CYSTOSCOPY,BILATERAL RETROGRADE PYELOGRAM/ REMOVAL LEFT URETERAL STENT/ FULGERATION BLADDER MUCOSA/ INSERTION RIGHT URETERAL STENT  . CYSTOSCOPY W/ URETERAL STENT PLACEMENT  05/28/2012   Procedure: CYSTOSCOPY WITH RETROGRADE PYELOGRAM/URETERAL STENT PLACEMENT;  Surgeon: Reece Packer, MD;  Location: [facility];  Service: Urology;  Laterality: Left;  . CYSTOSCOPY WITH RETROGRADE PYELOGRAM, URETEROSCOPY AND STENT PLACEMENT Right 06/23/2013   Procedure: CYSTOSCOPY WITH RIGHT URETEROSCOPY, RIGHT  RETROGRADE PYELOGRAM, WITH LASER LIPOTRIPSY AND RIGHT URETERAL STENT EXCHANGE ;  Surgeon: Molli Hazard, MD;  Location: [facility];  Service: Urology;  Laterality: Right;  STENT EXCHANGE    . CYSTOSCOPY WITH RETROGRADE PYELOGRAM, URETEROSCOPY AND STENT PLACEMENT Right 11/06/2013   Procedure: CYSTOSCOPY WITH RETROGRADE PYELOGRAM, URETEROSCOPY STONE REMOVAL AND STENT REMOVAL;  Surgeon: Molli Hazard, MD;  Location: [facility];  Service: Urology;  Laterality: Right;  . HOLMIUM LASER APPLICATION N/A 1/44/3154   Procedure: HOLMIUM LASER APPLICATION;  Surgeon: Molli Hazard, MD;  Location: [facility];  Service: Urology;  Laterality: N/A;  . INSERTION OF DIALYSIS CATHETER N/A 09/24/2013   Procedure: INSERTION OF  DIALYSIS CATHETER;  Surgeon: Rosetta Posner, MD;  Location[facility];  Service: Vascular;  Laterality: N/A;  . IR AV DIALY SHUNT INTRO NEEDLE/INTRAC INITIAL W/PTA/STENT/IMG LT Left 08/13/2018  . IR AV DIALY SHUNT INTRO  NEEDLE/INTRACATH INITIAL W/PTA/IMG LEFT  04/19/2018  . IR AV DIALY SHUNT INTRO NEEDLE/INTRACATH INITIAL W/PTA/IMG LEFT  08/14/2018  . IR US GUIDE VASC ACCESS LEFT  04/19/2018  . IR US GUIDE VASC ACCESS LEFT  08/13/2018  . IR US GUIDE VASC ACCESS LEFT  08/14/2018  . PORT-A-CATH REMOVAL    . PORTACATH PLACEMENT    . REVISON OF ARTERIOVENOUS FISTULA Left 07/25/2016   Procedure: REVISON OF LEFT ARTERIOVENOUS FISTULA;  Surgeon: Elam Dutch, MD;  Location: [place];  Service: Vascular;  Laterality: Left;    Allergies: Rosiglitazone; Amoxicillin; and Amoxicillin-pot clavulanate  Medications: Prior to Admission medications   Medication Sig Start Date End Date Taking? Authorizing Provider  acetaminophen (TYLENOL) 500 MG tablet Take 1,000 mg by mouth daily as needed for moderate pain.    [provider]  aspirin EC 81 MG tablet Take 81 mg by mouth at bedtime.     [provider]  Calcium Acetate 667 MG TABS Take 667 mg by mouth 3 (three) times daily with meals. And snacks    [provider]  ciprofloxacin (CIPRO) 500 MG tablet Take 1 tablet (500 mg total) by mouth daily. 05/28/18   Truman Hayward, MD  [place] (SODIUM HYPOCHLORITE) 0.25 % SOLN Apply 1 application topically 3 (three) times daily.    [provider]  diltiazem (CARDIZEM) 30 MG tablet Take 1 tablet every 4 hours AS NEEDED for rapid heart rate lasting more than 30 mins as long as top blood pressure >100. 03/09/17   Sherran Needs, NP  diphenhydrAMINE (BENADRYL) 25 MG tablet Take 25 mg by mouth daily as needed for allergies.    [provider]  doxycycline (VIBRA-TABS) 100 MG tablet Take 1 tablet (100 mg total) by mouth 2 (two) times daily. 05/28/18   Truman Hayward, MD  DULoxetine (CYMBALTA) 60 MG capsule Take 1 capsule (60 mg total) by mouth daily. 12/03/12   Janece Canterbury, MD  DYMISTA 137-50 MCG/ACT SUSP PLACE 2 SPRAYS INTO THE NOSE DAILY. Patient taking differently: Place 2 sprays into  both nostrils daily as needed (allergies).  02/06/18   Mannam, Hart Robinsons, MD  famotidine (PEPCID) 20 MG tablet TAKE 1 TABLET BY MOUTH AT BEDTIME Patient taking differently: Take 20 mg by mouth at bedtime 10/15/14   Tanda Rockers, MD  feeding supplement, RESOURCE BREEZE, (RESOURCE BREEZE) LIQD Take 1 Container by mouth 2 (two) times daily between meals.     [provider]  glipiZIDE (GLUCOTROL) 5 MG tablet Take 0.5 tablets (2.5 mg total) by mouth daily. 08/31/16   Debbe Odea, MD  levothyroxine (SYNTHROID, LEVOTHROID) 75 MCG tablet Take 75 mcg by mouth daily. 08/17/15   [provider]  midodrine (PROAMATINE) 10 MG tablet Take 1 tablet (10 mg total) by mouth 3 (three) times daily. Patient taking differently: Take 10 mg by mouth every Monday, Wednesday, and Friday. Dialysis days 08/31/16   Debbe Odea, MD  multivitamin (RENA-VIT) TABS tablet Take 1 tablet by mouth daily. 08/16/15   [provider]  oxyCODONE-acetaminophen (PERCOCET/ROXICET) 5-325 MG tablet Take 1-2 tablets by mouth every 6 (six) hours as needed. Patient has not taken since started dialysis 08/2013 due to causes hypotension Patient taking differently: Take 2 tablets by mouth every 6 (  six) hours as needed for moderate pain.  07/25/16   Alvia Grove, PA-C  pravastatin (PRAVACHOL) 40 MG tablet Take 40 mg by mouth at bedtime.  12/03/12   Janece Canterbury, MD  Probiotic Product (PROBIOTIC DAILY PO) Take 1 tablet by mouth 2 (two) times daily.     [provider]  sevelamer carbonate (RENVELA) 800 MG tablet Take 2,400 mg by mouth 3 (three) times daily with meals.    [provider]  sucroferric oxyhydroxide (VELPHORO) 500 MG chewable tablet Chew 1,000 mg by mouth 3 (three) times daily with meals.     [provider]     Family History  Problem Relation Age of Onset  . Coronary artery disease Mother   . Hypertension Mother   . Diabetes type II Mother   . Coronary artery disease  Father   . Hypertension Father   . Malignant hyperthermia Father   . Cancer Maternal Grandfather        ? Type  . Kidney failure Maternal Grandmother     Social History   Socioeconomic History  . Marital status: Married    Spouse name: Not on file  . Number of children: Not on file  . Years of education: Not on file  . Highest education level: Not on file  Occupational History  . Not on file  Social Needs  . Financial resource strain: Not on file  . Food insecurity:    Worry: Not on file    Inability: Not on file  . Transportation needs:    Medical: Not on file    Non-medical: Not on file  Tobacco Use  . Smoking status: Former Smoker    Packs/day: 0.25    Years: 1.00    Pack years: 0.25    Types: Cigarettes    Last attempt to quit: 11/28/1979    Years since quitting: 38.8  . Smokeless tobacco: Never Used  Substance and Sexual Activity  . Alcohol use: No  . Drug use: No  . Sexual activity: Never    Birth control/protection: Abstinence  Lifestyle  . Physical activity:    Days per week: Not on file    Minutes per session: Not on file  . Stress: Not on file  Relationships  . Social connections:    Talks on phone: Not on file    Gets together: Not on file    Attends religious service: Not on file    Active member of club or organization: Not on file    Attends meetings of clubs or organizations: Not on file    Relationship status: Not on file  Other Topics Concern  . Not on file  Social History Narrative  . Not on file    Review of Systems: A 12 point ROS discussed and pertinent positives are indicated in the HPI above.  All other systems are negative.  Review of Systems  Constitutional: Negative for activity change, fatigue and fever.  HENT: Positive for sinus pressure.   Eyes: Negative for visual disturbance.  Respiratory: Negative for cough and shortness of breath.   Cardiovascular: Negative for chest pain.  Gastrointestinal: Negative for abdominal pain.   Musculoskeletal: Negative for back pain.  Neurological: Negative for weakness.  Psychiatric/Behavioral: Negative for behavioral problems and confusion.    Vital Signs: BP 119/83   Pulse 94   Temp 98.3 F (36.8 C) (Oral)   LMP 06/13/2013   SpO2 98% Comment: 1.5 l/ [place]  Physical Exam  Constitutional: She is  oriented to person, place, and time.  Cardiovascular: Normal rate and regular rhythm.  Pulmonary/Chest: Effort normal and breath sounds normal.  Abdominal: Soft. Bowel sounds are normal.  Musculoskeletal: Normal range of motion.  Left arm dialysis fistula- No thrill no pulse  Neurological: She is alert and oriented to person, place, and time.  Skin: Skin is warm and dry.  Psychiatric: She has a normal mood and affect. Her behavior is normal. Judgment and thought content normal.  Vitals reviewed.   Imaging: No results found.  Labs:  CBC: Recent Labs    09/14/18 0637  WBC 16.5*  HGB 8.7*  HCT 31.7*  PLT 266    COAGS: Recent Labs    08/14/18 1106  INR 1.19  APTT 38*    BMP: Recent Labs    09/14/18 0637  NA 133*  K 3.8  CL 96*  CO2 26  GLUCOSE 176*  BUN 37*  CALCIUM 8.9  CREATININE 5.80*  GFRNONAA 7*  GFRAA 9*    LIVER FUNCTION TESTS: No results for input(s): BILITOT, AST, ALT, ALKPHOS, PROT, ALBUMIN in the last 8760 hours.  TUMOR MARKERS: No results for input(s): AFPTM, CEA, CA199, CHROMGRNA in the last 8760 hours.  Assessment and Plan:  Left arm dialysis fistula clotted Scheduled for thrombolysis with possible angioplasty/stent Possible dialysis tunneled catheter placement. Risks and benefits discussed with the patient including, but not limited to bleeding, infection, vascular injury, pulmonary embolism, need for tunneled HD catheter placement or even death.  All of the patient's questions were answered, patient is agreeable to proceed. Consent signed and in chart.   Thank you for this interesting consult.  I greatly enjoyed meeting  Analeah Brame and look forward to participating in their care.  A copy of this report was sent to the requesting provider on this date.  Electronically Signed: Lavonia Drafts, PA-C 09/17/2018, 12:30 PM   I spent a total of 40 Minutes    in face to face in clinical consultation, greater than 50% of which was counseling/coordinating care for left arm dialysis fistula declot

## 2018-09-17 NOTE — ED Notes (Signed)
IV team at bedside 

## 2018-09-17 NOTE — ED Provider Notes (Signed)
[facility] EMERGENCY DEPARTMENT Provider Note   CSN: [id number] Arrival date & time: 09/17/18  1539     History   Chief Complaint Chief Complaint  Patient presents with  . Anemia  . Needs Dialysis    HPI [name] is a 57 y.o. female.  HPI Patient is a 57 year old female with a history of end-stage renal disease who dialyzes on Monday Wednesday Friday.  Friday night she developed sudden bleeding from her left upper extremity fistula and she had significant blood loss at the scene.  She is brought to the emergency department and her bleeding resolved.  She attempted to have dialysis on Monday but her left upper extremity fistula had clotted and she was sent to the hospital today for declot procedure.  On arrival to interventional radiology she was found to have an elevated white blood cell count a hemoglobin of 4.9 and thus it was felt not to be safe to perform declot on her left upper extremity.  She had a dialysis catheter placed in her right internal jugular and she was sent to the ER for evaluation given her heart rate of 140.  At this time the patient has no significant complaints.  She denies chest pain.  She reports some exertional shortness of breath.  She denies blood in her stool.  No recent change in her medications.  No use of anticoagulants.  The patient makes minimal urine   Past Medical History:  Diagnosis Date  . Chronic diastolic CHF (congestive heart failure) ([place])   . Chronic kidney disease (CKD), stage III (moderate) ([place])    [name] nephrology lov note 05-12-2013 on pt chart now on HD  . DCM (dilated cardiomyopathy) ([place])    nonischemic - EF now 60-65% by echo 2017  . Depression   . DM type 2 (diabetes mellitus, type 2) ([facility])    diet controlledwith retinopathy and nephropathy  . ESRD on hemodialysis ([place]) 08/21/2017  . GERD (gastroesophageal reflux disease)   . H/O cardiac arrest 10/2012  . Hard of hearing 10/2012   now wears 2 hearing  aids  . History of hemodialysis dec 2013  . HTN (hypertension)   . Hydradenitis   . Hyperkalemia   . Hyperlipidemia   . Hypothyroidism   . Iron deficiency anemia   . Morbid obesity ([place])   . Multinodular goiter   . Neuropathic pain 08/21/2017  . Orthostatic hypotension 06/27/2018  . Pneumonia Nov 10, 2012  . Sleep apnea    Intolerant to CPAP  . Tinnitus 12/26/2016  . Typical atrial flutter ([place]) 04/10/2017  . Urinary tract infection    taking antibiotics for 3 days prior to surgery    Patient Active Problem List   Diagnosis Date Noted  . Orthostatic hypotension 06/27/2018  . Neuropathic pain 08/21/2017  . Typical atrial flutter ([place]) 04/10/2017  . Persistent atrial fibrillation 02/04/2017  . Tinnitus 12/26/2016  . Streptococcal bacteremia   . ESRD (end stage renal disease) on dialysis ([place]) 08/25/2016  . Absolute anemia   . Pressure ulcer 05/01/2015  . Chronic respiratory failure ([place]) 11/10/2013  . COPD (chronic obstructive pulmonary disease) ([place]) 11/06/2013  . DCM (dilated cardiomyopathy) ([place])   . OSA (obstructive sleep apnea) 07/17/2013  . Chronic diastolic heart failure ([place]) 07/16/2013  . SOB (shortness of breath) 07/11/2013  . Hypertension 11/30/2012  . Hydronephrosis of left kidney 05/28/2012  . Nephrolithiasis 05/28/2012  . Hidradenitis suppurativa 05/21/2012  . Symptomatic anemia 05/21/2012  . DM type 2 (  diabetes mellitus, type 2) ([place])   . Mood disorder in conditions classified elsewhere 09/06/2010  . ERYTHEMA NODOSUM, HX OF 07/27/2010  . GOITER, MULTINODULAR 05/18/2010  . Morbid obesity ([place]) 05/18/2010  . GERD 05/18/2010    Past Surgical History:  Procedure Laterality Date  . AV FISTULA PLACEMENT Left 07/25/2013   Procedure: ARTERIOVENOUS (AV) FISTULA CREATION ;  Surgeon: Rosetta Posner, MD;  Location: [facility];  Service: Vascular;  Laterality: Left;  . CYSTOSCOPY W/ RETROGRADES  11/16/2012   Procedure: CYSTOSCOPY WITH RETROGRADE PYELOGRAM;  Surgeon: Reece Packer, MD;  Location: [facility];  Service: Urology;  Laterality: Bilateral;  CYSTOSCOPY,BILATERAL RETROGRADE PYELOGRAM/ REMOVAL LEFT URETERAL STENT/ FULGERATION BLADDER MUCOSA/ INSERTION RIGHT URETERAL STENT  . CYSTOSCOPY W/ URETERAL STENT PLACEMENT  05/28/2012   Procedure: CYSTOSCOPY WITH RETROGRADE PYELOGRAM/URETERAL STENT PLACEMENT;  Surgeon: Reece Packer, MD;  Location: [facility];  Service: Urology;  Laterality: Left;  . CYSTOSCOPY WITH RETROGRADE PYELOGRAM, URETEROSCOPY AND STENT PLACEMENT Right 06/23/2013   Procedure: CYSTOSCOPY WITH RIGHT URETEROSCOPY, RIGHT  RETROGRADE PYELOGRAM, WITH LASER LIPOTRIPSY AND RIGHT URETERAL STENT EXCHANGE ;  Surgeon: Molli Hazard, MD;  Location: [facility];  Service: Urology;  Laterality: Right;  STENT EXCHANGE    . CYSTOSCOPY WITH RETROGRADE PYELOGRAM, URETEROSCOPY AND STENT PLACEMENT Right 11/06/2013   Procedure: CYSTOSCOPY WITH RETROGRADE PYELOGRAM, URETEROSCOPY STONE REMOVAL AND STENT REMOVAL;  Surgeon: Molli Hazard, MD;  Location: [facility];  Service: Urology;  Laterality: Right;  . HOLMIUM LASER APPLICATION N/A 0/96/0454   Procedure: HOLMIUM LASER APPLICATION;  Surgeon: Molli Hazard, MD;  Location: [facility];  Service: Urology;  Laterality: N/A;  . INSERTION OF DIALYSIS CATHETER N/A 09/24/2013   Procedure: INSERTION OF DIALYSIS CATHETER;  Surgeon: Rosetta Posner, MD;  Location: [facility];  Service: Vascular;  Laterality: N/A;  . IR AV DIALY SHUNT INTRO NEEDLE/INTRAC INITIAL W/PTA/STENT/IMG LT Left 08/13/2018  . IR AV DIALY SHUNT INTRO NEEDLE/INTRACATH INITIAL W/PTA/IMG LEFT  04/19/2018  . IR AV DIALY SHUNT INTRO NEEDLE/INTRACATH INITIAL W/PTA/IMG LEFT  08/14/2018  . IR FLUORO GUIDE CV LINE RIGHT  09/17/2018  . IR US GUIDE VASC ACCESS LEFT  04/19/2018  . IR US GUIDE VASC ACCESS LEFT  08/13/2018  . IR US GUIDE VASC ACCESS LEFT  08/14/2018  . IR US GUIDE VASC ACCESS RIGHT  09/17/2018  . PORT-A-CATH REMOVAL    . PORTACATH PLACEMENT    . REVISON OF  ARTERIOVENOUS FISTULA Left 07/25/2016   Procedure: REVISON OF LEFT ARTERIOVENOUS FISTULA;  Surgeon: Elam Dutch, MD;  Location: [facility] OR;  Service: Vascular;  Laterality: Left;     OB History   None      Home Medications    Prior to Admission medications   Medication Sig Start Date End Date Taking? Authorizing Provider  acetaminophen (TYLENOL) 500 MG tablet Take 1,000 mg by mouth daily as needed for moderate pain.   Yes [provider]  aspirin EC 81 MG tablet Take 81 mg by mouth at bedtime.    Yes [provider]  Calcium Acetate 667 MG TABS Take 667 mg by mouth See admin instructions. Take 667 mg by mouth three times a day with meals and 667 mg with each snack   Yes [provider]  ciprofloxacin (CIPRO) 500 MG tablet Take 1 tablet (500 mg total) by mouth daily. 05/28/18  Yes Tommy Medal, Lavell Islam, MD  Dakins (SODIUM HYPOCHLORITE) 0.25 % SOLN Apply 1 application topically See admin instructions. Apply as directed  to groin and buttocks 3 times a day   Yes [provider]  diltiazem (CARDIZEM) 30 MG tablet Take 1 tablet every 4 hours AS NEEDED for rapid heart rate lasting more than 30 mins as long as top blood pressure >100. Patient taking differently: Take 30 mg by mouth every 4 (four) hours as needed (FOR A RAPID HEART RATE LASTING MORE THAN 30 MINUTES AS LONG AS SYSTOLIC ("top number") READING IS >100).  03/09/17  Yes Sherran Needs, NP  diphenhydrAMINE (BENADRYL) 25 MG tablet Take 25 mg by mouth daily as needed for allergies.   Yes [provider]  DULoxetine (CYMBALTA) 60 MG capsule Take 1 capsule (60 mg total) by mouth daily. 12/03/12  Yes Short, Noah Delaine, MD  famotidine (PEPCID) 20 MG tablet TAKE 1 TABLET BY MOUTH AT BEDTIME Patient taking differently: Take 20 mg by mouth at bedtime 10/15/14  Yes Tanda Rockers, MD  feeding supplement, RESOURCE BREEZE, (RESOURCE BREEZE) LIQD Take 1 Container by mouth 2 (two) times daily between meals.    Yes  [provider]  glipiZIDE (GLUCOTROL) 5 MG tablet Take 0.5 tablets (2.5 mg total) by mouth daily. 08/31/16  Yes Debbe Odea, MD  levothyroxine (SYNTHROID, LEVOTHROID) 75 MCG tablet Take 75 mcg by mouth daily. 08/17/15  Yes [provider]  midodrine (PROAMATINE) 10 MG tablet Take 1 tablet (10 mg total) by mouth 3 (three) times daily. Patient taking differently: Take 10 mg by mouth every Monday, Wednesday, and Friday.  08/31/16  Yes Debbe Odea, MD  multivitamin (RENA-VIT) TABS tablet Take 1 tablet by mouth daily. 08/16/15  Yes [provider]  oxyCODONE-acetaminophen (PERCOCET/ROXICET) 5-325 MG tablet Take 1-2 tablets by mouth every 6 (six) hours as needed. Patient has not taken since started dialysis 08/2013 due to causes hypotension Patient taking differently: Take 2 tablets by mouth every 6 (six) hours as needed for moderate pain.  07/25/16  Yes Alvia Grove, PA-C  oxymetazoline (AFRIN) 0.05 % nasal spray Place 1 spray into both nostrils once as needed for congestion.    Yes [provider]  pravastatin (PRAVACHOL) 40 MG tablet Take 40 mg by mouth at bedtime.  12/03/12  Yes Short, Noah Delaine, MD  Probiotic Product (PROBIOTIC DAILY PO) Take 1 capsule by mouth 2 (two) times daily.    Yes [provider]  sevelamer carbonate (RENVELA) 800 MG tablet Take 2,400 mg by mouth 3 (three) times daily with meals.   Yes [provider]  sucroferric oxyhydroxide (VELPHORO) 500 MG chewable tablet Chew 1,000 mg by mouth 3 (three) times daily with meals.    Yes [provider]  doxycycline (VIBRA-TABS) 100 MG tablet Take 1 tablet (100 mg total) by mouth 2 (two) times daily. Patient not taking: Reported on 09/17/2018 05/28/18   Tommy Medal, Lavell Islam, MD  DYMISTA 137-50 MCG/ACT SUSP PLACE 2 SPRAYS INTO THE NOSE DAILY. Patient not taking: No sig reported 02/06/18   Marshell Garfinkel, MD    Family History Family History  Problem Relation Age of Onset  .  Coronary artery disease Mother   . Hypertension Mother   . Diabetes type II Mother   . Coronary artery disease Father   . Hypertension Father   . Malignant hyperthermia Father   . Cancer Maternal Grandfather        ? Type  . Kidney failure Maternal Grandmother     Social History Social History   Tobacco Use  . Smoking status: Former Smoker    Packs/day:  0.25    Years: 1.00    Pack years: 0.25    Types: Cigarettes    Last attempt to quit: 11/28/1979    Years since quitting: 38.8  . Smokeless tobacco: Never Used  Substance Use Topics  . Alcohol use: No  . Drug use: No     Allergies   Rosiglitazone; Amoxicillin; and Amoxicillin-pot clavulanate   Review of Systems Review of Systems  All other systems reviewed and are negative.    Physical Exam Updated Vital Signs BP 104/69   Pulse (!) 147   Temp 98.4 F (36.9 C) (Oral)   Resp 20   Ht 5\' 6"  (1.676 m)   Wt (!) 145.2 kg   LMP 06/13/2013   SpO2 97%   BMI 51.65 kg/m   Physical Exam  Constitutional: She is oriented to person, place, and time. She appears well-developed and well-nourished. No distress.  HENT:  Head: Normocephalic and atraumatic.  Eyes: EOM are normal.  Neck: Normal range of motion.  Cardiovascular: Regular rhythm and normal heart sounds.  Tachycardia  Pulmonary/Chest: Effort normal and breath sounds normal.  Abdominal: Soft. She exhibits no distension. There is no tenderness.  Musculoskeletal: Normal range of motion.  Neurological: She is alert and oriented to person, place, and time.  Skin: Skin is warm and dry.  Psychiatric: She has a normal mood and affect. Judgment normal.  Nursing note and vitals reviewed.    ED Treatments / Results  Labs (all labs ordered are listed, but only abnormal results are displayed) Labs Reviewed  CULTURE, BLOOD (ROUTINE X 2)  CULTURE, BLOOD (ROUTINE X 2)  TYPE AND SCREEN  PREPARE RBC (CROSSMATCH)   Hemoglobin  Date Value Ref Range Status  09/17/2018  4.9 (LL) 12.0 - 15.0 g/dL Corrected    Comment:    REPEATED TO VERIFY THIS RESULT HAS BEEN CALLED TO S KLESCH BY LESLIE BENFIELD ON 10 22 2019 AT 1303, AND HAS BEEN READ BACK. CRITICAL RESULT VERIFIED S KLESCH,RN CORRECTED ON 10/22 AT 1612: PREVIOUSLY REPORTED AS 4.9 REPEATED TO VERIFY THIS RESULT HAS BEEN CALLED TO S KLESCH BY LESLIE BENFIELD ON 10 22 2019 AT 1303, AND HAS BEEN READ BACK. CRITICAL RESULT VERIFIED   09/14/2018 8.7 (L) 12.0 - 15.0 g/dL Final  02/03/2017 10.3 (L) 12.0 - 15.0 g/dL Final  02/02/2017 10.5 (L) 12.0 - 15.0 g/dL Final   HGB  Date Value Ref Range Status  06/01/2008 10.1 (L) 11.6 - 15.9 g/dL Final   BMP Latest Ref Rng & Units 09/17/2018 09/14/2018 02/03/2017  Glucose 70 - 99 mg/dL 113(H) 176(H) 123(H)  BUN 6 - 20 mg/dL 91(H) 37(H) 74(H)  Creatinine 0.44 - 1.00 mg/dL 12.30(H) 5.80(H) 10.45(H)  Sodium 135 - 145 mmol/L 136 133(L) 135  Potassium 3.5 - 5.1 mmol/L 4.8 3.8 5.0  Chloride 98 - 111 mmol/L 100 96(L) 101  CO2 22 - 32 mmol/L 23 26 17(L)  Calcium 8.9 - 10.3 mg/dL 9.0 8.9 9.1    CBC Latest Ref Rng & Units 09/17/2018 09/14/2018 02/03/2017  WBC 4.0 - 10.5 K/uL 25.7(H) 16.5(H) 14.3(H)  Hemoglobin 12.0 - 15.0 g/dL 4.9(LL) 8.7(L) 10.3(L)  Hematocrit 36.0 - 46.0 % 17.7(L) 31.7(L) 36.0  Platelets 150 - 400 K/uL 419(H) 266 287     EKG ECG interpretation   Date: 09/17/2018  Rate: 140  Rhythm: narrow complex tachycardia  QRS Axis: normal  Intervals: normal  ST/T Wave abnormalities: normal  Conduction Disutrbances: none  Narrative Interpretation:   Old EKG Reviewed: No significant  changes noted    .    Radiology Ir Fluoro Guide Cv Line Right  Result Date: 09/17/2018 CLINICAL DATA:  Occluded hemodialysis fistula. Patient presented for declot, but patient was found to be severely anemic with elevated WBC count. EXAM: EXAM RIGHT IJ CATHETER PLACEMENT UNDER ULTRASOUND AND FLUOROSCOPIC GUIDANCE TECHNIQUE: The procedure, risks (including but not limited  to bleeding, infection, organ damage, pneumothorax), benefits, and alternatives were explained to the patient. Questions regarding the procedure were encouraged and answered. The patient understands and consents to the procedure.On a patency of the right IJ vein was confirmed with ultrasound with image documentation. An appropriate skin site was determined. Skin site was marked. Region was prepped using maximum barrier technique including cap and mask, sterile gown, sterile gloves, large sterile sheet, and Chlorhexidine as cutaneous antisepsis. The region was infiltrated locally with 1% lidocaine. Under real-time ultrasound guidance, the right IJ vein was accessed with a 19 gauge needle; the needle tip within the vein was confirmed with ultrasound image documentation. The needle exchanged over a guidewire for vascular dilator which allowed advancement of a 20 cm Trialysis catheter. This was positioned with the tip at the cavoatrial junction. Spot chest radiograph shows good positioning and no pneumothorax. Catheter was flushed and sutured externally with 0-Prolene sutures. Patient tolerated the procedure well. FLUOROSCOPY TIME:  1.1 minute; 409 uGym2 DAP COMPLICATIONS: COMPLICATIONS none IMPRESSION: 1. Technically successful right IJ Trialysis catheter placement. Electronically Signed   By: Lucrezia Europe M.D.   On: 09/17/2018 15:52   Ir US Guide Vasc Access Right  Result Date: 09/17/2018 CLINICAL DATA:  Occluded hemodialysis fistula. Patient presented for declot, but patient was found to be severely anemic with elevated WBC count. EXAM: EXAM RIGHT IJ CATHETER PLACEMENT UNDER ULTRASOUND AND FLUOROSCOPIC GUIDANCE TECHNIQUE: The procedure, risks (including but not limited to bleeding, infection, organ damage, pneumothorax), benefits, and alternatives were explained to the patient. Questions regarding the procedure were encouraged and answered. The patient understands and consents to the procedure.On a patency of the  right IJ vein was confirmed with ultrasound with image documentation. An appropriate skin site was determined. Skin site was marked. Region was prepped using maximum barrier technique including cap and mask, sterile gown, sterile gloves, large sterile sheet, and Chlorhexidine as cutaneous antisepsis. The region was infiltrated locally with 1% lidocaine. Under real-time ultrasound guidance, the right IJ vein was accessed with a 19 gauge needle; the needle tip within the vein was confirmed with ultrasound image documentation. The needle exchanged over a guidewire for vascular dilator which allowed advancement of a 20 cm Trialysis catheter. This was positioned with the tip at the cavoatrial junction. Spot chest radiograph shows good positioning and no pneumothorax. Catheter was flushed and sutured externally with 0-Prolene sutures. Patient tolerated the procedure well. FLUOROSCOPY TIME:  1.1 minute; 811 uGym2 DAP COMPLICATIONS: COMPLICATIONS none IMPRESSION: 1. Technically successful right IJ Trialysis catheter placement. Electronically Signed   By: Lucrezia Europe M.D.   On: 09/17/2018 15:52   Dg Chest Portable 1 View  Result Date: 09/17/2018 CLINICAL DATA:  Tachycardia, elevated WBC. Pt denies chest complaints at this time. Pt currently wearing oxygen. EXAM: PORTABLE CHEST 1 VIEW COMPARISON:  02/02/2017 FINDINGS: RIGHT-sided IJ central line has been placed, tip probably at the level of the UPPER RIGHT atrium. No pneumothorax. The heart is enlarged. There is pulmonary vascular congestion and RIGHT LOWER lobe atelectasis. IMPRESSION: Interval placement of RIGHT IJ central line, tip probably to level of the UPPER RIGHT atrium. Electronically Signed  By: Nolon Nations M.D.   On: 09/17/2018 16:52    Procedures .Critical Care Performed by: Jola Schmidt, MD Authorized by: Jola Schmidt, MD     CRITICAL CARE Performed by: Jola Schmidt Total critical care time: 35 minutes Critical care time was exclusive of  separately billable procedures and treating other patients. Critical care was necessary to treat or prevent imminent or life-threatening deterioration. Critical care was time spent personally by me on the following activities: development of treatment plan with patient and/or surrogate as well as nursing, discussions with consultants, evaluation of patient's response to treatment, examination of patient, obtaining history from patient or surrogate, ordering and performing treatments and interventions, ordering and review of laboratory studies, ordering and review of radiographic studies, pulse oximetry and re-evaluation of patient's condition.   Medications Ordered in ED Medications  0.9 %  sodium chloride infusion (Manually program via Guardrails IV Fluids) ( Intravenous New Bag/Given 09/17/18 1717)     Initial Impression / Assessment and Plan / ED Course  I have reviewed the triage vital signs and the nursing notes.  Pertinent labs & imaging results that were available during my care of the patient were reviewed by me and considered in my medical decision making (see chart for details).     Suspect acute blood loss anemia as the cause of her hemoglobin of 4.9.  The blood loss was from several days ago.  She is tachycardic.  This is narrow complex tachycardia.  This could represent atrial flutter.  I would like to see her heart rate after transfusion of 3 units of packed red blood cells.  White count noted.  No clear source. Low suspicion for sepsis. Blood cultures sent.reports brown stool  6:55 PM Spoke with Dr Jonelle Sidle, hospitalist who will admit  Final Clinical Impressions(s) / ED Diagnoses   Final diagnoses:  Symptomatic anemia    ED Discharge Orders    None       Jola Schmidt, MD 09/17/18 [phone]

## 2018-09-17 NOTE — H&P (Signed)
History and Physical   [name] XEN:[id number] DOB: 1961-06-02 DOA: 09/17/2018  Referring MD/NP/PA: Dr. Jola Schmidt  PCP: Harlan Stains, MD   Patient coming from: Home  Chief Complaint: Anemia  HPI: [name] is a 57 y.o. female with medical history significant of end-stage renal disease on hemodialysis Mondays Wednesdays and Fridays, diabetes, morbid obesity, depression, hypertension and hyperkalemia who apparently had significant bleeding from her left upper extremity fistula 4 days ago.  Patient apparently had significant bleeding with hemoglobin dropping to 4.9 today.  Patient attempted to have hemodialysis on Monday but the left upper extremities fistula has clotted.  She was sent over for declotting but interventional radiology was not comfortable today.  Found hemoglobin to be 4.9 and leukocytosis.  For that reason she was sent over to the ER.  Temporary IJ catheter was placed on the right for hemodialysis.  Patient was tachycardic and feeling weak.  She was deemed symptomatic from the anemia.  She is therefore being admitted to the hospital with symptomatic anemia.  ED Course: Temperature is 98.9, blood pressure 153/122, pulse 149, respiratory rate of 39 and oxygen sat 94% 2 L.  White count is 25.7 with hemoglobin 4.9 and platelets of 419.  Sodium is 136 potassium 4.8 BUN 91 creatinine 12.3 glucose 113.  Patient was ordered 3 units of packed red blood cells she has been admitted to the hospital.  Nephrology contacted for hemodialysis tomorrow.  Review of Systems: As per HPI otherwise 10 point review of systems negative.    Past Medical History:  Diagnosis Date  . Chronic diastolic CHF (congestive heart failure) ([place])   . Chronic kidney disease (CKD), stage III (moderate) ([place])    [name] nephrology lov note 05-12-2013 on pt chart now on HD  . DCM (dilated cardiomyopathy) ([place])    nonischemic - EF now 60-65% by echo 2017  . Depression   . DM type 2 (diabetes mellitus, type  2) ([facility])    diet controlledwith retinopathy and nephropathy  . ESRD on hemodialysis ([place]) 08/21/2017  . GERD (gastroesophageal reflux disease)   . H/O cardiac arrest 10/2012  . Hard of hearing 10/2012   now wears 2 hearing aids  . History of hemodialysis dec 2013  . HTN (hypertension)   . Hydradenitis   . Hyperkalemia   . Hyperlipidemia   . Hypothyroidism   . Iron deficiency anemia   . Morbid obesity ([place])   . Multinodular goiter   . Neuropathic pain 08/21/2017  . Orthostatic hypotension 06/27/2018  . Pneumonia Nov 10, 2012  . Sleep apnea    Intolerant to CPAP  . Tinnitus 12/26/2016  . Typical atrial flutter ([place]) 04/10/2017  . Urinary tract infection    taking antibiotics for 3 days prior to surgery    Past Surgical History:  Procedure Laterality Date  . AV FISTULA PLACEMENT Left 07/25/2013   Procedure: ARTERIOVENOUS (AV) FISTULA CREATION ;  Surgeon: Rosetta Posner, MD;  Location: [facility];  Service: Vascular;  Laterality: Left;  . CYSTOSCOPY W/ RETROGRADES  11/16/2012   Procedure: CYSTOSCOPY WITH RETROGRADE PYELOGRAM;  Surgeon: Reece Packer, MD;  Location: [facility];  Service: Urology;  Laterality: Bilateral;  CYSTOSCOPY,BILATERAL RETROGRADE PYELOGRAM/ REMOVAL LEFT URETERAL STENT/ FULGERATION BLADDER MUCOSA/ INSERTION RIGHT URETERAL STENT  . CYSTOSCOPY W/ URETERAL STENT PLACEMENT  05/28/2012   Procedure: CYSTOSCOPY WITH RETROGRADE PYELOGRAM/URETERAL STENT PLACEMENT;  Surgeon: Reece Packer, MD;  Location[facility];  Service: Urology;  Laterality: Left;  . CYSTOSCOPY WITH RETROGRADE PYELOGRAM,  URETEROSCOPY AND STENT PLACEMENT Right 06/23/2013   Procedure: CYSTOSCOPY WITH RIGHT URETEROSCOPY, RIGHT  RETROGRADE PYELOGRAM, WITH LASER LIPOTRIPSY AND RIGHT URETERAL STENT EXCHANGE ;  Surgeon: Molli Hazard, MD;  Location[facility];  Service: Urology;  Laterality: Right;  STENT EXCHANGE    . CYSTOSCOPY WITH RETROGRADE PYELOGRAM, URETEROSCOPY AND STENT PLACEMENT Right 11/06/2013    Procedure: CYSTOSCOPY WITH RETROGRADE PYELOGRAM, URETEROSCOPY STONE REMOVAL AND STENT REMOVAL;  Surgeon: Molli Hazard, MD;  Location[facility];  Service: Urology;  Laterality: Right;  . HOLMIUM LASER APPLICATION N/A 07/14/2992   Procedure: HOLMIUM LASER APPLICATION;  Surgeon: Molli Hazard, MD;  Location: [facility];  Service: Urology;  Laterality: N/A;  . INSERTION OF DIALYSIS CATHETER N/A 09/24/2013   Procedure: INSERTION OF DIALYSIS CATHETER;  Surgeon: Rosetta Posner, MD;  Location: [facility];  Service: Vascular;  Laterality: N/A;  . IR AV DIALY SHUNT INTRO NEEDLE/INTRAC INITIAL W/PTA/STENT/IMG LT Left 08/13/2018  . IR AV DIALY SHUNT INTRO NEEDLE/INTRACATH INITIAL W/PTA/IMG LEFT  04/19/2018  . IR AV DIALY SHUNT INTRO NEEDLE/INTRACATH INITIAL W/PTA/IMG LEFT  08/14/2018  . IR FLUORO GUIDE CV LINE RIGHT  09/17/2018  . IR US GUIDE VASC ACCESS LEFT  04/19/2018  . IR US GUIDE VASC ACCESS LEFT  08/13/2018  . IR US GUIDE VASC ACCESS LEFT  08/14/2018  . IR US GUIDE VASC ACCESS RIGHT  09/17/2018  . PORT-A-CATH REMOVAL    . PORTACATH PLACEMENT    . REVISON OF ARTERIOVENOUS FISTULA Left 07/25/2016   Procedure: REVISON OF LEFT ARTERIOVENOUS FISTULA;  Surgeon: Elam Dutch, MD;  Location: [facility];  Service: Vascular;  Laterality: Left;     reports that she quit smoking about 38 years ago. Her smoking use included cigarettes. She has a 0.25 pack-year smoking history. She has never used smokeless tobacco. She reports that she does not drink alcohol or use drugs.  Allergies  Allergen Reactions  . Rosiglitazone Other (See Comments)    "Avandia" = Reaction not recalled  . Amoxicillin Rash and Other (See Comments)    Tolerated Zosyn 01/2013. [place] Has patient had a PCN reaction causing immediate rash, facial/tongue/throat swelling, SOB or lightheadedness with hypotension: Yes Has patient had a PCN reaction causing severe rash involving mucus membranes or skin necrosis: No Has patient had a PCN  reaction that required hospitalization: No Has patient had a PCN reaction occurring within the last 10 years: No If all of the above answers are "NO", then may proceed with Cephalosporin use.   Marland Kitchen Amoxicillin-Pot Clavulanate Diarrhea    Family History  Problem Relation Age of Onset  . Coronary artery disease Mother   . Hypertension Mother   . Diabetes type II Mother   . Coronary artery disease Father   . Hypertension Father   . Malignant hyperthermia Father   . Cancer Maternal Grandfather        ? Type  . Kidney failure Maternal Grandmother      Prior to Admission medications   Medication Sig Start Date End Date Taking? Authorizing Provider  acetaminophen (TYLENOL) 500 MG tablet Take 1,000 mg by mouth daily as needed for moderate pain.   Yes [provider]  aspirin EC 81 MG tablet Take 81 mg by mouth at bedtime.    Yes [provider]  Calcium Acetate 667 MG TABS Take 667 mg by mouth See admin instructions. Take 667 mg by mouth three times a day with meals and 667 mg with each snack  Yes [provider]  ciprofloxacin (CIPRO) 500 MG tablet Take 1 tablet (500 mg total) by mouth daily. 05/28/18  Yes Tommy Medal, Lavell Islam, MD  Dakins (SODIUM HYPOCHLORITE) 0.25 % SOLN Apply 1 application topically See admin instructions. Apply as directed to groin and buttocks 3 times a day   Yes [provider]  diltiazem (CARDIZEM) 30 MG tablet Take 1 tablet every 4 hours AS NEEDED for rapid heart rate lasting more than 30 mins as long as top blood pressure >100. Patient taking differently: Take 30 mg by mouth every 4 (four) hours as needed (FOR A RAPID HEART RATE LASTING MORE THAN 30 MINUTES AS LONG AS SYSTOLIC ("top number") READING IS >100).  03/09/17  Yes Sherran Needs, NP  diphenhydrAMINE (BENADRYL) 25 MG tablet Take 25 mg by mouth daily as needed for allergies.   Yes [provider]  DULoxetine (CYMBALTA) 60 MG capsule Take 1 capsule (60 mg total) by  mouth daily. 12/03/12  Yes Short, Noah Delaine, MD  famotidine (PEPCID) 20 MG tablet TAKE 1 TABLET BY MOUTH AT BEDTIME Patient taking differently: Take 20 mg by mouth at bedtime 10/15/14  Yes Tanda Rockers, MD  feeding supplement, RESOURCE BREEZE, (RESOURCE BREEZE) LIQD Take 1 Container by mouth 2 (two) times daily between meals.    Yes [provider]  glipiZIDE (GLUCOTROL) 5 MG tablet Take 0.5 tablets (2.5 mg total) by mouth daily. 08/31/16  Yes Debbe Odea, MD  levothyroxine (SYNTHROID, LEVOTHROID) 75 MCG tablet Take 75 mcg by mouth daily. 08/17/15  Yes [provider]  midodrine (PROAMATINE) 10 MG tablet Take 1 tablet (10 mg total) by mouth 3 (three) times daily. Patient taking differently: Take 10 mg by mouth every Monday, Wednesday, and Friday.  08/31/16  Yes Debbe Odea, MD  multivitamin (RENA-VIT) TABS tablet Take 1 tablet by mouth daily. 08/16/15  Yes [provider]  oxyCODONE-acetaminophen (PERCOCET/ROXICET) 5-325 MG tablet Take 1-2 tablets by mouth every 6 (six) hours as needed. Patient has not taken since started dialysis 08/2013 due to causes hypotension Patient taking differently: Take 2 tablets by mouth every 6 (six) hours as needed for moderate pain.  07/25/16  Yes Alvia Grove, PA-C  oxymetazoline (AFRIN) 0.05 % nasal spray Place 1 spray into both nostrils once as needed for congestion.    Yes [provider]  pravastatin (PRAVACHOL) 40 MG tablet Take 40 mg by mouth at bedtime.  12/03/12  Yes Short, Noah Delaine, MD  Probiotic Product (PROBIOTIC DAILY PO) Take 1 capsule by mouth 2 (two) times daily.    Yes [provider]  sevelamer carbonate (RENVELA) 800 MG tablet Take 2,400 mg by mouth 3 (three) times daily with meals.   Yes [provider]  sucroferric oxyhydroxide (VELPHORO) 500 MG chewable tablet Chew 1,000 mg by mouth 3 (three) times daily with meals.    Yes [provider]  doxycycline (VIBRA-TABS) 100 MG tablet Take  1 tablet (100 mg total) by mouth 2 (two) times daily. Patient not taking: Reported on 09/17/2018 05/28/18   Tommy Medal, Lavell Islam, MD  DYMISTA 137-50 MCG/ACT SUSP PLACE 2 SPRAYS INTO THE NOSE DAILY. Patient not taking: No sig reported 02/06/18   Marshell Garfinkel, MD    Physical Exam: Vitals:   09/17/18 1845 09/17/18 1900 09/17/18 1918 09/17/18 1930  BP:  (!) 106/54  (!) 112/57  Pulse: 94 89  89  Resp: 19 (!) 24  (!) 39  Temp:   98.9 F (37.2 C)  TempSrc:   Rectal   SpO2: 99% 98%  94%  Weight:      Height:          Constitutional: NAD, calm, comfortable, Morbidly Obese Vitals:   09/17/18 1845 09/17/18 1900 09/17/18 1918 09/17/18 1930  BP:  (!) 106/54  (!) 112/57  Pulse: 94 89  89  Resp: 19 (!) 24  (!) 39  Temp:   98.9 F (37.2 C)   TempSrc:   Rectal   SpO2: 99% 98%  94%  Weight:      Height:       Eyes: PERRL, lids and conjunctivae normal ENMT: Mucous membranes are moist. Posterior pharynx clear of any exudate or lesions.Normal dentition.  Neck: Right IJ in place. normal, supple, no masses, no thyromegaly Respiratory: clear to auscultation bilaterally, no wheezing, no crackles. Normal respiratory effort. No accessory muscle use.  Cardiovascular: Left fistular area red, sinus tachycardia , no murmurs / rubs / gallops. No extremity edema. 2+ pedal pulses. No carotid bruits.  Abdomen: no tenderness, no masses palpated. No hepatosplenomegaly. Bowel sounds positive.  Musculoskeletal: no clubbing / cyanosis. No joint deformity upper and lower extremities. Good ROM, no contractures. Normal muscle tone.  Skin: no rashes, lesions, ulcers. No induration Neurologic: CN 2-12 grossly intact. Sensation intact, DTR normal. Strength 5/5 in all 4.  Psychiatric: Normal judgment and insight. Alert and oriented x 3. Normal mood.     Labs on Admission: I have personally reviewed following labs and imaging studies  CBC: Recent Labs  Lab 09/14/18 0637 09/17/18 1206  WBC 16.5* 25.7*    NEUTROABS 13.7*  --   HGB 8.7* 4.9*  HCT 31.7* 17.7*  MCV 101.6* 101.1*  PLT 266 867*   Basic Metabolic Panel: Recent Labs  Lab 09/14/18 0637 09/17/18 1206  NA 133* 136  K 3.8 4.8  CL 96* 100  CO2 26 23  GLUCOSE 176* 113*  BUN 37* 91*  CREATININE 5.80* 12.30*  CALCIUM 8.9 9.0   GFR: Estimated Creatinine Clearance: 7.6 mL/min (A) (by C-G formula based on SCr of 12.3 mg/dL (H)). Liver Function Tests: No results for input(s): AST, ALT, ALKPHOS, BILITOT, PROT, ALBUMIN in the last 168 hours. No results for input(s): LIPASE, AMYLASE in the last 168 hours. No results for input(s): AMMONIA in the last 168 hours. Coagulation Profile: Recent Labs  Lab 09/17/18 1206  INR 1.30   Cardiac Enzymes: No results for input(s): CKTOTAL, CKMB, CKMBINDEX, TROPONINI in the last 168 hours. BNP (last 3 results) No results for input(s): PROBNP in the last 8760 hours. HbA1C: No results for input(s): HGBA1C in the last 72 hours. CBG: Recent Labs  Lab 09/17/18 1223  GLUCAP 99   Lipid Profile: No results for input(s): CHOL, HDL, LDLCALC, TRIG, CHOLHDL, LDLDIRECT in the last 72 hours. Thyroid Function Tests: No results for input(s): TSH, T4TOTAL, FREET4, T3FREE, THYROIDAB in the last 72 hours. Anemia Panel: No results for input(s): VITAMINB12, FOLATE, FERRITIN, TIBC, IRON, RETICCTPCT in the last 72 hours. Urine analysis:    Component Value Date/Time   COLORURINE YELLOW 07/18/2013 0831   APPEARANCEUR CLOUDY (A) 07/18/2013 0831   LABSPEC 1.012 07/18/2013 0831   PHURINE 5.0 07/18/2013 0831   GLUCOSEU NEGATIVE 07/18/2013 0831   HGBUR SMALL (A) 07/18/2013 0831   BILIRUBINUR NEGATIVE 07/18/2013 0831   KETONESUR NEGATIVE 07/18/2013 0831   PROTEINUR 30 (A) 07/18/2013 0831   UROBILINOGEN 0.2 07/18/2013 0831   NITRITE NEGATIVE 07/18/2013 0831   LEUKOCYTESUR MODERATE (A) 07/18/2013 0831  Sepsis Labs: @LABRCNTIP (procalcitonin:4,lacticidven:4) )No results found for this or any previous  visit (from the past 240 hour(s)).   Radiological Exams on Admission: Ir Fluoro Guide Cv Line Right  Result Date: 09/17/2018 CLINICAL DATA:  Occluded hemodialysis fistula. Patient presented for declot, but patient was found to be severely anemic with elevated WBC count. EXAM: EXAM RIGHT IJ CATHETER PLACEMENT UNDER ULTRASOUND AND FLUOROSCOPIC GUIDANCE TECHNIQUE: The procedure, risks (including but not limited to bleeding, infection, organ damage, pneumothorax), benefits, and alternatives were explained to the patient. Questions regarding the procedure were encouraged and answered. The patient understands and consents to the procedure.On a patency of the right IJ vein was confirmed with ultrasound with image documentation. An appropriate skin site was determined. Skin site was marked. Region was prepped using maximum barrier technique including cap and mask, sterile gown, sterile gloves, large sterile sheet, and Chlorhexidine as cutaneous antisepsis. The region was infiltrated locally with 1% lidocaine. Under real-time ultrasound guidance, the right IJ vein was accessed with a 19 gauge needle; the needle tip within the vein was confirmed with ultrasound image documentation. The needle exchanged over a guidewire for vascular dilator which allowed advancement of a 20 cm Trialysis catheter. This was positioned with the tip at the cavoatrial junction. Spot chest radiograph shows good positioning and no pneumothorax. Catheter was flushed and sutured externally with 0-Prolene sutures. Patient tolerated the procedure well. FLUOROSCOPY TIME:  1.1 minute; 850 uGym2 DAP COMPLICATIONS: COMPLICATIONS none IMPRESSION: 1. Technically successful right IJ Trialysis catheter placement. Electronically Signed   By: Lucrezia Europe M.D.   On: 09/17/2018 15:52   Ir US Guide Vasc Access Right  Result Date: 09/17/2018 CLINICAL DATA:  Occluded hemodialysis fistula. Patient presented for declot, but patient was found to be severely  anemic with elevated WBC count. EXAM: EXAM RIGHT IJ CATHETER PLACEMENT UNDER ULTRASOUND AND FLUOROSCOPIC GUIDANCE TECHNIQUE: The procedure, risks (including but not limited to bleeding, infection, organ damage, pneumothorax), benefits, and alternatives were explained to the patient. Questions regarding the procedure were encouraged and answered. The patient understands and consents to the procedure.On a patency of the right IJ vein was confirmed with ultrasound with image documentation. An appropriate skin site was determined. Skin site was marked. Region was prepped using maximum barrier technique including cap and mask, sterile gown, sterile gloves, large sterile sheet, and Chlorhexidine as cutaneous antisepsis. The region was infiltrated locally with 1% lidocaine. Under real-time ultrasound guidance, the right IJ vein was accessed with a 19 gauge needle; the needle tip within the vein was confirmed with ultrasound image documentation. The needle exchanged over a guidewire for vascular dilator which allowed advancement of a 20 cm Trialysis catheter. This was positioned with the tip at the cavoatrial junction. Spot chest radiograph shows good positioning and no pneumothorax. Catheter was flushed and sutured externally with 0-Prolene sutures. Patient tolerated the procedure well. FLUOROSCOPY TIME:  1.1 minute; 277 uGym2 DAP COMPLICATIONS: COMPLICATIONS none IMPRESSION: 1. Technically successful right IJ Trialysis catheter placement. Electronically Signed   By: Lucrezia Europe M.D.   On: 09/17/2018 15:52   Dg Chest Portable 1 View  Result Date: 09/17/2018 CLINICAL DATA:  Tachycardia, elevated WBC. Pt denies chest complaints at this time. Pt currently wearing oxygen. EXAM: PORTABLE CHEST 1 VIEW COMPARISON:  02/02/2017 FINDINGS: RIGHT-sided IJ central line has been placed, tip probably at the level of the UPPER RIGHT atrium. No pneumothorax. The heart is enlarged. There is pulmonary vascular congestion and RIGHT LOWER  lobe atelectasis. IMPRESSION: Interval placement of RIGHT IJ  central line, tip probably to level of the UPPER RIGHT atrium. Electronically Signed   By: Nolon Nations M.D.   On: 09/17/2018 16:52    Assessment/Plan Principal Problem:   Symptomatic anemia Active Problems:   Morbid obesity ([place])   GERD   DM type 2 (diabetes mellitus, type 2) ([place])   ESRD (end stage renal disease) on dialysis ([place])   Anemia     #1 symptomatic anemia: Patient being transfused 3 units of packed red blood cells.  We will follow H&H thereafter.  Stool guaiac is negative x2 so this is entirely from her recent bleed.  PT is 16.1 and INR 1.3.  #2 end-stage renal disease: Patient has temporary IJ catheter and she will proceed with hemodialysis tomorrow.  #3 diabetes: Continue with home regimen.  Sliding scale insulin will be ordered.  #4 morbid obesity: Counseling provided.  #5 clotted dialysis access catheter: IR will declot it when safe.  Will use IJ catheter for now.   DVT prophylaxis: SCD Code Status: Full code Family Communication: Husband at bedside Disposition Plan: Home Consults called: Nephrology PA: Patient's placed on dialysis list for tomorrow Admission status: Inpatient  Severity of Illness: The appropriate patient status for this patient is INPATIENT. Inpatient status is judged to be reasonable and necessary in order to provide the required intensity of service to ensure the patient's safety. The patient's presenting symptoms, physical exam findings, and initial radiographic and laboratory data in the context of their chronic comorbidities is felt to place them at high risk for further clinical deterioration. Furthermore, it is not anticipated that the patient will be medically stable for discharge from the hospital within 2 midnights of admission. The following factors support the patient status of inpatient.   " The patient's presenting symptoms include hemoglobin 4.9. " The worrisome  physical exam findings include morbid obesity with generalized weakness and tachycardia. " The initial radiographic and laboratory data are worrisome because of hemoglobin is 4.9. " The chronic co-morbidities include end-stage renal disease.   * I certify that at the point of admission it is my clinical judgment that the patient will require inpatient hospital care spanning beyond 2 midnights from the point of admission due to high intensity of service, high risk for further deterioration and high frequency of surveillance required.Barbette Merino MD [facility] Pager [phone]  If 7PM-7AM, please contact night-coverage [email or web address] Password [facility]  09/17/2018, 7:42 PM

## 2018-09-17 NOTE — Progress Notes (Addendum)
Attempt report x1. RN not available at this time. Per ED charge nurse, RN will call be me.

## 2018-09-18 ENCOUNTER — Inpatient Hospital Stay (HOSPITAL_COMMUNITY): Payer: Medicare Other

## 2018-09-18 ENCOUNTER — Encounter (HOSPITAL_COMMUNITY): Payer: Self-pay | Admitting: Anesthesiology

## 2018-09-18 ENCOUNTER — Encounter (HOSPITAL_COMMUNITY)
Admission: EM | Disposition: A | Discharge status: Home / Self Care | Payer: Self-pay | Source: Home / Self Care | Attending: Internal Medicine

## 2018-09-18 DIAGNOSIS — N186 End stage renal disease: Secondary | ICD-10-CM

## 2018-09-18 DIAGNOSIS — T8249XD Other complication of vascular dialysis catheter, subsequent encounter: Secondary | ICD-10-CM

## 2018-09-18 DIAGNOSIS — Z452 Encounter for adjustment and management of vascular access device: Secondary | ICD-10-CM

## 2018-09-18 DIAGNOSIS — Z992 Dependence on renal dialysis: Secondary | ICD-10-CM

## 2018-09-18 DIAGNOSIS — K210 Gastro-esophageal reflux disease with esophagitis: Secondary | ICD-10-CM

## 2018-09-18 LAB — CBC
HCT: 33.7 % — ABNORMAL LOW (ref 36.0–46.0)
Hemoglobin: 9.7 g/dL — ABNORMAL LOW (ref 12.0–15.0)
MCH: 27.6 pg (ref 26.0–34.0)
MCHC: 28.8 g/dL — ABNORMAL LOW (ref 30.0–36.0)
MCV: 95.7 fL (ref 80.0–100.0)
PLATELETS: 332 10*3/uL (ref 150–400)
RBC: 3.52 MIL/uL — AB (ref 3.87–5.11)
RDW: 17.7 % — AB (ref 11.5–15.5)
WBC: 18.3 10*3/uL — AB (ref 4.0–10.5)
nRBC: 0 % (ref 0.0–0.2)

## 2018-09-18 LAB — GLUCOSE, CAPILLARY
GLUCOSE-CAPILLARY: 150 mg/dL — AB (ref 70–99)
Glucose-Capillary: 109 mg/dL — ABNORMAL HIGH (ref 70–99)
Glucose-Capillary: 161 mg/dL — ABNORMAL HIGH (ref 70–99)

## 2018-09-18 LAB — COMPREHENSIVE METABOLIC PANEL
ALBUMIN: 2.8 g/dL — AB (ref 3.5–5.0)
ALK PHOS: 85 U/L (ref 38–126)
ALT: 11 U/L (ref 0–44)
ANION GAP: 17 — AB (ref 5–15)
AST: 13 U/L — AB (ref 15–41)
BUN: 95 mg/dL — ABNORMAL HIGH (ref 6–20)
CALCIUM: 8.9 mg/dL (ref 8.9–10.3)
CO2: 21 mmol/L — AB (ref 22–32)
Chloride: 98 mmol/L (ref 98–111)
Creatinine, Ser: 13.36 mg/dL — ABNORMAL HIGH (ref 0.44–1.00)
GFR calc Af Amer: 3 mL/min — ABNORMAL LOW (ref >60)
GFR calc non Af Amer: 3 mL/min — ABNORMAL LOW (ref >60)
GLUCOSE: 109 mg/dL — AB (ref 70–99)
Potassium: 5.4 mmol/L — ABNORMAL HIGH (ref 3.5–5.1)
SODIUM: 136 mmol/L (ref 135–145)
Total Bilirubin: 1.2 mg/dL (ref 0.3–1.2)
Total Protein: 8.5 g/dL — ABNORMAL HIGH (ref 6.5–8.1)

## 2018-09-18 LAB — SURGICAL PCR SCREEN
MRSA, PCR: NEGATIVE
STAPHYLOCOCCUS AUREUS: NEGATIVE

## 2018-09-18 LAB — HIV ANTIBODY (ROUTINE TESTING W REFLEX): HIV Screen 4th Generation wRfx: NONREACTIVE

## 2018-09-18 LAB — PHOSPHORUS: Phosphorus: 8.6 mg/dL — ABNORMAL HIGH (ref 2.5–4.6)

## 2018-09-18 SURGERY — INSERTION OF DIALYSIS CATHETER
Anesthesia: General

## 2018-09-18 MED ORDER — ALTEPLASE 2 MG IJ SOLR: 2.0000 mg | Freq: Once | INTRAMUSCULAR | Status: DC | PRN

## 2018-09-18 MED ORDER — HEPARIN SODIUM (PORCINE) 1000 UNIT/ML DIALYSIS
5000.0000 [IU] | Freq: Once | INTRAMUSCULAR | Status: AC
  Administered 2018-09-18: 5000 [IU] via INTRAVENOUS_CENTRAL
  Filled 2018-09-18: qty 5

## 2018-09-18 MED ORDER — HEPARIN SODIUM (PORCINE) 1000 UNIT/ML DIALYSIS
1000.0000 [IU] | INTRAMUSCULAR | Status: DC | PRN
  Filled 2018-09-18: qty 1

## 2018-09-18 MED ORDER — HEPARIN SODIUM (PORCINE) 1000 UNIT/ML IJ SOLN
INTRAMUSCULAR | Status: AC
  Administered 2018-09-18: 5000 [IU] via INTRAVENOUS_CENTRAL
  Filled 2018-09-18: qty 5

## 2018-09-18 MED ORDER — PENTAFLUOROPROP-TETRAFLUOROETH EX AERO: 1.0000 "application " | INHALATION_SPRAY | CUTANEOUS | Status: DC | PRN

## 2018-09-18 MED ORDER — LIDOCAINE HCL (PF) 1 % IJ SOLN: 5.0000 mL | INTRAMUSCULAR | Status: DC | PRN

## 2018-09-18 MED ORDER — DARBEPOETIN ALFA 100 MCG/0.5ML IJ SOSY
PREFILLED_SYRINGE | INTRAMUSCULAR | Status: AC
  Administered 2018-09-18: 100 ug via INTRAVENOUS
  Filled 2018-09-18: qty 0.5

## 2018-09-18 MED ORDER — CHLORHEXIDINE GLUCONATE CLOTH 2 % EX PADS
6.0000 | MEDICATED_PAD | Freq: Every day | CUTANEOUS | Status: DC
  Administered 2018-09-18 – 2018-09-20 (×2): 6 via TOPICAL

## 2018-09-18 MED ORDER — ALTEPLASE 2 MG IJ SOLR
INTRAMUSCULAR | Status: AC
  Administered 2018-09-18: 4 mg
  Filled 2018-09-18: qty 4

## 2018-09-18 MED ORDER — CALCITRIOL 0.5 MCG PO CAPS
3.5000 ug | ORAL_CAPSULE | ORAL | Status: DC
  Administered 2018-09-19: 3.5 ug via ORAL
  Administered 2018-09-20: 3.75 ug via ORAL

## 2018-09-18 MED ORDER — SODIUM CHLORIDE 0.9 % IV SOLN: 100.0000 mL | INTRAVENOUS | Status: DC | PRN

## 2018-09-18 MED ORDER — LIDOCAINE-PRILOCAINE 2.5-2.5 % EX CREA: 1.0000 "application " | TOPICAL_CREAM | CUTANEOUS | Status: DC | PRN

## 2018-09-18 MED ORDER — CALCITRIOL 0.5 MCG PO CAPS
ORAL_CAPSULE | ORAL | Status: AC
  Administered 2018-09-19: 3.5 ug via ORAL
  Filled 2018-09-18: qty 7

## 2018-09-18 MED ORDER — CINACALCET HCL 30 MG PO TABS
120.0000 mg | ORAL_TABLET | ORAL | Status: DC
  Filled 2018-09-18: qty 4

## 2018-09-18 MED ORDER — DARBEPOETIN ALFA 100 MCG/0.5ML IJ SOSY
100.0000 ug | PREFILLED_SYRINGE | INTRAMUSCULAR | Status: DC
  Administered 2018-09-18: 100 ug via INTRAVENOUS
  Filled 2018-09-18: qty 0.5

## 2018-09-18 MED ORDER — HEPARIN SODIUM (PORCINE) 1000 UNIT/ML IJ SOLN
INTRAMUSCULAR | Status: AC
  Administered 2018-09-18: 2800 [IU]
  Filled 2018-09-18: qty 3

## 2018-09-18 MED ORDER — DOXYCYCLINE HYCLATE 100 MG PO TABS
100.0000 mg | ORAL_TABLET | Freq: Two times a day (BID) | ORAL | Status: DC
  Administered 2018-09-18 – 2018-09-20 (×5): 100 mg via ORAL
  Filled 2018-09-18 (×5): qty 1

## 2018-09-18 NOTE — Progress Notes (Signed)
Patient informed me that she had taken oxycodone from her supply of home medications around 1600 without letting her AM nurse know. Educated patient on importance of only taking medications that have been prescribed while in the hospital. Patient verbalized understanding.

## 2018-09-18 NOTE — Progress Notes (Signed)
When patient was transferred to [facility], RN attempted to do full skin assessment. Patient refused assessment on her buttocks. Will try again later.

## 2018-09-18 NOTE — Progress Notes (Signed)
[facility]                                                                              Patient Demographics  [name], is a 57 y.o. female, DOB - 02-22-1961, AGT:[id number]  Admit date - 09/17/2018   Admitting Physician Elwyn Reach, MD  Outpatient Primary MD for the patient is Harlan Stains, MD  Outpatient specialists:   LOS - 1  days   Medical records reviewed and are as summarized below:    Chief Complaint  Patient presents with  . Anemia  . Needs Dialysis       Brief summary   Patient is a 57 year old female with ESRD on hemodialysis MWF, diabetes, morbid obesity, depression, hypertension, hyperkalemia presented to ED for anemia.  He apparently patient had significant bleeding from her left upper extremity fistula 4 days prior to admission.  Hemoglobin dropped to 4.9.  Patient attempted to have hemodialysis on Monday 10/21 however the left upper extremity fistula clotted.  She was sent over for declotting procedure and was not deemed unsafe for the procedure with hemoglobin of 4.9.  Patient was sent to the ED.  Temporary IJ cath was placed.  Patient was tachycardiac and Thursday and like weakness.  She was admitted for further work-up.   Assessment & Plan    Principal Problem:   Symptomatic anemia -Status post transfusion 3 units of packed RBCs, hemoglobin 4.9 at the time of admission -FOBT negative. -Posttransfusion hemoglobin improved to 9.7.   Active Problems:  ESRD on hemodialysis with access issues -Currently having access issues with left upper extremity fistula clotted and nonfunctioning temporary [facility] -Nephrology consulted, patient had transient SVT in hemodialysis with a heart rate of 140s 150s today -Vascular surgery and IR consulted due to emergent need of access for hemodialysis. NPO  SVT today, transient, history of paroxysmal atrial fibrillation -Followed in A. fib clinic, by Dr. Golden Hurter, cardiology -EKG on the floor  showed normal sinus rhythm with a heart rate of 84, asymptomatic per my examination -Patient has been considered not to be a anticoagulation candidate due to open skin lesions secondary to hidradenitis and causes constant bleeding and oozing. -Per cardiology, patient cannot be on long-acting CCB or beta-blocker due to issues with hypotension during hemodialysis -Currently stable, if repeat episodes, will consult cardiology  History of hiradenitis on the groin and buttocks -Wounds on the buttocks examined, per patient she has required IV antibiotics in the past.  Presented with white count of 25.7, improved to 18.3 today -Placed on doxycycline  History of orthostatic hypotension -Patient has a history of orthostatic hypotension during hemodialysis and has been controlled with midodrine 10 mg every MWF with dialysis.   Chronic diastolic CHF -Currently has some volume overload, needs HD  Morbid obesity -Patient counseled on diet and weight control   Code Status: Full code DVT Prophylaxis:  SCD's Family Communication: Discussed in detail with the patient, all imaging results, lab results explained to the patient and husband at the bedside in the morning   Disposition Plan: To remain inpatient, high risk of deterioration due to volume overload, need of hemodialysis but having  issues with access.  Transferred to stepdown unit  Time Spent in minutes   35 minutes  Procedures:  Nephrology  Consultants:   Nephrology  Antimicrobials:      Medications  Scheduled Meds: . acidophilus  1 capsule Oral BID  . aspirin EC  81 mg Oral QHS  . calcitRIOL  3.5 mcg Oral Q M,W,F-HD  . calcium acetate  667 mg Oral TID WC  . Chlorhexidine Gluconate Cloth  6 each Topical Q0600  . cinacalcet  120 mg Oral Q M,W,F-HD  . ciprofloxacin  500 mg Oral Daily  . darbepoetin (ARANESP) injection - DIALYSIS  100 mcg Intravenous Q Wed-HD  . DULoxetine  60 mg Oral Daily  . famotidine  20 mg Oral QHS  .  feeding supplement  1 Container Oral BID BM  . glipiZIDE  2.5 mg Oral Q breakfast  . insulin aspart  0-5 Units Subcutaneous QHS  . insulin aspart  0-9 Units Subcutaneous TID WC  . levothyroxine  75 mcg Oral QAC breakfast  . midodrine  10 mg Oral Q M,W,F  . multivitamin  1 tablet Oral QHS  . pravastatin  40 mg Oral QHS  . sevelamer carbonate  2,400 mg Oral TID WC  . sodium chloride flush  3 mL Intravenous Q12H  . sodium hypochlorite  1 application Topical TID  . sucroferric oxyhydroxide  1,000 mg Oral TID WC   Continuous Infusions: . sodium chloride     PRN Meds:.sodium chloride, acetaminophen, calcium acetate, diltiazem, diphenhydrAMINE, ondansetron **OR** ondansetron (ZOFRAN) IV, oxyCODONE-acetaminophen, sodium chloride flush   Antibiotics   Anti-infectives (From admission, onward)   Start     Dose/Rate Route Frequency Ordered Stop   09/18/18 1000  ciprofloxacin (CIPRO) tablet 500 mg     500 mg Oral Daily 09/17/18 2230          Subjective:   [name] was seen and examined today.  Seen patient multiple times today at the earlier examination, no acute issues, subsequently seen in hemodialysis for wound examination. Received a call around 1245 from renal PA about patient developing SVT in hemodialysis unit.  Examined the patient, heart rate improved to 86, normal sinus rhythm spontaneously. Patient denies dizziness, chest pain, shortness of breath, abdominal pain, N/V/D/C, new weakness, numbess, tingling.  Objective:   Vitals:   09/18/18 0818 09/18/18 1007 09/18/18 1200 09/18/18 1254  BP: (!) 154/74 136/82 (!) 136/46 (!) 146/77  Pulse: 73 85 (!) 138 86  Resp: 18 20 (!) 34 (!) 28  Temp: (!) 97.4 F (36.3 C) 98.3 F (36.8 C)  98.1 F (36.7 C)  TempSrc: Oral Oral  Oral  SpO2: 99%   99%  Weight:      Height:        Intake/Output Summary (Last 24 hours) at 09/18/2018 1320 Last data filed at 09/18/2018 0900 Gross per 24 hour  Intake 1592 ml  Output 0 ml  Net 1592  ml     Wt Readings from Last 3 Encounters:  09/17/18 (!) 156.7 kg  08/14/18 (!) 158.8 kg  06/27/18 136.1 kg     Exam  General: Alert and oriented x 3, NAD  Eyes:   HEENT:  Right IJ in place  Cardiovascular: S1 S2 auscultated, RRR  Respiratory: CTAB  Gastrointestinal: Soft, nontender, nondistended, + bowel sounds  Ext: no pedal edema bilaterally  Neuro: no new deficits  Musculoskeletal: No digital cyanosis, clubbing  Skin  Psych: anxious   Data Reviewed:  I have personally reviewed following  labs and imaging studies  Micro Results Recent Results (from the past 240 hour(s))  Blood culture (routine x 2)     Status: None (Preliminary result)   Collection Time: 09/17/18  4:36 PM  Result Value Ref Range Status   Specimen Description BLOOD RIGHT WRIST  Final   Special Requests   Final    BOTTLES DRAWN AEROBIC AND ANAEROBIC Blood Culture results may not be optimal due to an inadequate volume of blood received in culture bottles   Culture   Final    NO GROWTH < 12 HOURS Performed at [facility] [address] [place], [place] [address]    Report Status PENDING  Incomplete  Blood culture (routine x 2)     Status: None (Preliminary result)   Collection Time: 09/17/18  5:13 PM  Result Value Ref Range Status   Specimen Description BLOOD RIGHT FOREARM  Final   Special Requests   Final    BOTTLES DRAWN AEROBIC AND ANAEROBIC Blood Culture adequate volume   Culture   Final    NO GROWTH < 24 HOURS Performed at [facility], [place] [address], [place], [place] [address]    Report Status PENDING  Incomplete    Radiology Reports Ir Fluoro Guide Cv Line Right  Result Date: 09/17/2018 CLINICAL DATA:  Occluded hemodialysis fistula. Patient presented for declot, but patient was found to be severely anemic with elevated WBC count. EXAM: EXAM RIGHT IJ CATHETER PLACEMENT UNDER ULTRASOUND AND FLUOROSCOPIC GUIDANCE TECHNIQUE: The procedure, risks (including but not  limited to bleeding, infection, organ damage, pneumothorax), benefits, and alternatives were explained to the patient. Questions regarding the procedure were encouraged and answered. The patient understands and consents to the procedure.On a patency of the right IJ vein was confirmed with ultrasound with image documentation. An appropriate skin site was determined. Skin site was marked. Region was prepped using maximum barrier technique including cap and mask, sterile gown, sterile gloves, large sterile sheet, and Chlorhexidine as cutaneous antisepsis. The region was infiltrated locally with 1% lidocaine. Under real-time ultrasound guidance, the right IJ vein was accessed with a 19 gauge needle; the needle tip within the vein was confirmed with ultrasound image documentation. The needle exchanged over a guidewire for vascular dilator which allowed advancement of a 20 cm Trialysis catheter. This was positioned with the tip at the cavoatrial junction. Spot chest radiograph shows good positioning and no pneumothorax. Catheter was flushed and sutured externally with 0-Prolene sutures. Patient tolerated the procedure well. FLUOROSCOPY TIME:  1.1 minute; 622 uGym2 DAP COMPLICATIONS: COMPLICATIONS none IMPRESSION: 1. Technically successful right IJ Trialysis catheter placement. Electronically Signed   By: Lucrezia Europe M.D.   On: 09/17/2018 15:52   Ir US Guide Vasc Access Right  Result Date: 09/17/2018 CLINICAL DATA:  Occluded hemodialysis fistula. Patient presented for declot, but patient was found to be severely anemic with elevated WBC count. EXAM: EXAM RIGHT IJ CATHETER PLACEMENT UNDER ULTRASOUND AND FLUOROSCOPIC GUIDANCE TECHNIQUE: The procedure, risks (including but not limited to bleeding, infection, organ damage, pneumothorax), benefits, and alternatives were explained to the patient. Questions regarding the procedure were encouraged and answered. The patient understands and consents to the procedure.On a patency  of the right IJ vein was confirmed with ultrasound with image documentation. An appropriate skin site was determined. Skin site was marked. Region was prepped using maximum barrier technique including cap and mask, sterile gown, sterile gloves, large sterile sheet, and Chlorhexidine as cutaneous antisepsis. The region was infiltrated locally  with 1% lidocaine. Under real-time ultrasound guidance, the right IJ vein was accessed with a 19 gauge needle; the needle tip within the vein was confirmed with ultrasound image documentation. The needle exchanged over a guidewire for vascular dilator which allowed advancement of a 20 cm Trialysis catheter. This was positioned with the tip at the cavoatrial junction. Spot chest radiograph shows good positioning and no pneumothorax. Catheter was flushed and sutured externally with 0-Prolene sutures. Patient tolerated the procedure well. FLUOROSCOPY TIME:  1.1 minute; 938 uGym2 DAP COMPLICATIONS: COMPLICATIONS none IMPRESSION: 1. Technically successful right IJ Trialysis catheter placement. Electronically Signed   By: Lucrezia Europe M.D.   On: 09/17/2018 15:52   Dg Chest Portable 1 View  Result Date: 09/17/2018 CLINICAL DATA:  Tachycardia, elevated WBC. Pt denies chest complaints at this time. Pt currently wearing oxygen. EXAM: PORTABLE CHEST 1 VIEW COMPARISON:  02/02/2017 FINDINGS: RIGHT-sided IJ central line has been placed, tip probably at the level of the UPPER RIGHT atrium. No pneumothorax. The heart is enlarged. There is pulmonary vascular congestion and RIGHT LOWER lobe atelectasis. IMPRESSION: Interval placement of RIGHT IJ central line, tip probably to level of the UPPER RIGHT atrium. Electronically Signed   By: Nolon Nations M.D.   On: 09/17/2018 16:52    Lab Data:  CBC: Recent Labs  Lab 09/14/18 0637 09/17/18 1206 09/18/18 0429  WBC 16.5* 25.7* 18.3*  NEUTROABS 13.7*  --   --   HGB 8.7* 4.9* 9.7*  HCT 31.7* 17.7* 33.7*  MCV 101.6* 101.1* 95.7  PLT  266 419* 182   Basic Metabolic Panel: Recent Labs  Lab 09/14/18 0637 09/17/18 1206 09/18/18 0233 09/18/18 0429  NA 133* 136  --  136  K 3.8 4.8  --  5.4*  CL 96* 100  --  98  CO2 26 23  --  21*  GLUCOSE 176* 113*  --  109*  BUN 37* 91*  --  95*  CREATININE 5.80* 12.30*  --  13.36*  CALCIUM 8.9 9.0  --  8.9  PHOS  --   --  8.6*  --    GFR: Estimated Creatinine Clearance: 7.3 mL/min (A) (by C-G formula based on SCr of 13.36 mg/dL (H)). Liver Function Tests: Recent Labs  Lab 09/18/18 0429  AST 13*  ALT 11  ALKPHOS 85  BILITOT 1.2  PROT 8.5*  ALBUMIN 2.8*   No results for input(s): LIPASE, AMYLASE in the last 168 hours. No results for input(s): AMMONIA in the last 168 hours. Coagulation Profile: Recent Labs  Lab 09/17/18 1206  INR 1.30   Cardiac Enzymes: No results for input(s): CKTOTAL, CKMB, CKMBINDEX, TROPONINI in the last 168 hours. BNP (last 3 results) No results for input(s): PROBNP in the last 8760 hours. HbA1C: No results for input(s): HGBA1C in the last 72 hours. CBG: Recent Labs  Lab 09/17/18 1223 09/17/18 2312 09/18/18 0816  GLUCAP 99 141* 109*   Lipid Profile: No results for input(s): CHOL, HDL, LDLCALC, TRIG, CHOLHDL, LDLDIRECT in the last 72 hours. Thyroid Function Tests: No results for input(s): TSH, T4TOTAL, FREET4, T3FREE, THYROIDAB in the last 72 hours. Anemia Panel: No results for input(s): VITAMINB12, FOLATE, FERRITIN, TIBC, IRON, RETICCTPCT in the last 72 hours. Urine analysis:    Component Value Date/Time   COLORURINE YELLOW 07/18/2013 0831   APPEARANCEUR CLOUDY (A) 07/18/2013 0831   LABSPEC 1.012 07/18/2013 0831   PHURINE 5.0 07/18/2013 0831   GLUCOSEU NEGATIVE 07/18/2013 0831   HGBUR SMALL (A) 07/18/2013 0831  BILIRUBINUR NEGATIVE 07/18/2013 0831   KETONESUR NEGATIVE 07/18/2013 0831   PROTEINUR 30 (A) 07/18/2013 0831   UROBILINOGEN 0.2 07/18/2013 0831   NITRITE NEGATIVE 07/18/2013 0831   LEUKOCYTESUR MODERATE (A)  07/18/2013 0831     Klara Stjames M.D. [facility] 09/18/2018, 1:20 PM  Pager: [phone] Between 7am to 7pm - call Pager - [phone]  After 7pm go to [email or web address] - password [email or web address]  Call night coverage person covering after 7pm

## 2018-09-18 NOTE — Progress Notes (Signed)
HD catheter not running well BFR=250 d/t increased APs; paged Dr. Carolynne Edouard and he ordered TPA for 30 min . Resumed  tx stopped at 1211pm due to HR>140 sustained noted as high as 160; Paged Beckey Rutter, NP nephrology to inform her of the increased HRs; Dr. Carolynne Edouard, Nephrology came to  the unit and he ordered to stop the tx.1230 am- report given to Zigmund Gottron, RN about the increased HR and the problems with the catheter. Zigmund Gottron also notified at [phone] that the primary had been notified.

## 2018-09-18 NOTE — Progress Notes (Signed)
Dr. Trula Slade made aware, per Corbin Ade, RN 47M sts the pt ate a whole bagel at 1430, and sts she did not want to have surgery today anywasy, she wants to have the surgery tomorrow.

## 2018-09-18 NOTE — Progress Notes (Signed)
HD tx initiated via HD cath w/o problem Bilat ports: pull/push/flush equally w/o problem VSS Will continue to monitor while on HD tx 

## 2018-09-18 NOTE — Progress Notes (Signed)
Report called from HD:  they were unable to complete dialysis, heart rate climbed to 150's and EKG showed SVT. Patient arrived back to room, BP was 146/77, heart rate 86. Another EKG showed NSR. MD arrived to the room to assess patients. Orders to transfer to step down.   Patient is upset that she is NPO for possible procedure. [place] nurse came to assess wounds, however patient refused. Patient is refusing to have wounds assessed, cleaned or dressed by any staff member.  Patient is sitting edge of bed, will continue to monitor

## 2018-09-18 NOTE — Consult Note (Signed)
[place] Nurse wound consult note Patient is in [facility] [phone].  Patient is refusing to allow her primary nurse or me to look at the wound areas.  WOC consult will be cancelled. Val Riles, RN, MSN, CWOCN, CNS-BC, pager [phone]

## 2018-09-18 NOTE — Progress Notes (Signed)
Right temporary trialysis catheter unable to be used for HD today - per Dr. Pasty Arch recommendations existing dressing and suture were removed, right IJ was pulled back from the skin approximately 2 inches, new 0-prolene suture was placed, new tegaderm applied.  EBL: none Medications: none  Patient tolerated procedure well.   CXR pending to assess line position.  Candiss Norse, PA-C

## 2018-09-18 NOTE — Progress Notes (Signed)
Patient was made NPO for dialysis access placement. Patient became upset she was unable to eat when she did not receive a lunch tray. NPO status and necessity of dialysis access was explained to her by the hospitalist and renal PA in front of this RN, charge RN, and NT.   Later patient asked, " Why can't I have this procedure tomorrow"  The importance of not delaying her dialysis treatment was explained.  While assisting patient to call her family members about surgery, patient asked for water. Patient was again reminded, that for her safety, she was to remain NPO until after her procedure. Patient then admitted she ate a bagel at approximately 14:30. OR made aware, surgery canceled. MD made aware

## 2018-09-18 NOTE — Progress Notes (Signed)
Patient ID: [name], female   DOB: 09/03/1961, 57 y.o.   MRN: [id number]   Pt was seen and consented for left upper arm dialysis fistula thrombolysis with possible angioplasty/stent placement--- with possible tunneled dialysis catheter placement to be performed in IR yesterday as OP.  Unfortunately, pt was sent to ED with Hg 4.8; wbc 25.7 Tachycardia  I discussed with Shelley Penton PA and this plan was determined [name] also approved  Now pt is Inpt In dialysis now using Temp cath placed in IR yesterday  Received request for declot now as Inpt I have re seen pt and evaluated her Consented her for same procedure Pt is in dialysis now till approx 230-300 pm  Will plan for 10/24 (if all labs are appropriate and pt is stable)) She is aware and agreeable  Risks and benefits discussed with the patient including, but not limited to bleeding, infection, vascular injury, pulmonary embolism, need for tunneled HD catheter placement or even death.  All of the patient's questions were answered, patient is agreeable to proceed. Consent signed and in chart.

## 2018-09-18 NOTE — Progress Notes (Signed)
Report called to [place], [place]

## 2018-09-18 NOTE — Consult Note (Addendum)
[facility] Renal Consultation Note    Indication for Consultation:  Management of ESRD/hemodialysis; anemia, hypertension/volume and secondary hyperparathyroidism PCP:  Dr. Harlan Stains  HPI: [name] is a 57 y.o. female with ESRD on hemodialysis MWF at [facility]. PMH of DM, HTN, morbid obesity, chronic hidrenitis suppuritiva, AOCD, SHPT.   Patient presented to hospital 09/17/2018 to Interventional Radiology for declot of AVF. Last HD treatment 09/13/2018.  HGB was found to be 4.9 (apparently had bleeding from AVF earlier last week) and procedure was postponed. A temporary dialysis was placed until anemia could be corrected. Patient was sent to ED where she was found to have elevated white count and weakness. She was admitted for symptomatic anemia. Patient rec'd 3 units of PRBCs, HGB up to 9.7.   K+ climbed to 5.4 and dialysis was attempted today using temporary HD catheter. Unable to use temporary catheter D/T no blood return arterial port. This persisted after swelling cath flow. In interval, patient sustained SVT with HR 130-150s. SVT terminated when HD stopped. Now in SR. Primary notified, cardiology consulted. Checking CXR for line placement. Unfortunately unable to have declot in IR today. Have reached out to [facility] for assistance.   Past Medical History:  Diagnosis Date  . Chronic diastolic CHF (congestive heart failure) ([place])   . Chronic kidney disease (CKD), stage III (moderate) ([place])    [name] nephrology lov note 05-12-2013 on pt chart now on HD  . DCM (dilated cardiomyopathy) ([place])    nonischemic - EF now 60-65% by echo 2017  . Depression   . DM type 2 (diabetes mellitus, type 2) (H[facility])    diet controlledwith retinopathy and nephropathy  . ESRD on hemodialysis ([place]) 08/21/2017  . GERD (gastroesophageal reflux disease)   . H/O cardiac arrest 10/2012  . Hard of hearing 10/2012   now wears 2 hearing aids  . History of hemodialysis dec 2013  . HTN  (hypertension)   . Hydradenitis   . Hyperkalemia   . Hyperlipidemia   . Hypothyroidism   . Iron deficiency anemia   . Morbid obesity ([place])   . Multinodular goiter   . Neuropathic pain 08/21/2017  . Orthostatic hypotension 06/27/2018  . Pneumonia Nov 10, 2012  . Sleep apnea    Intolerant to CPAP  . Tinnitus 12/26/2016  . Typical atrial flutter ([place]) 04/10/2017  . Urinary tract infection    taking antibiotics for 3 days prior to surgery   Past Surgical History:  Procedure Laterality Date  . AV FISTULA PLACEMENT Left 07/25/2013   Procedure: ARTERIOVENOUS (AV) FISTULA CREATION ;  Surgeon: Rosetta Posner, MD;  Location: [place];  Service: Vascular;  Laterality: Left;  . CYSTOSCOPY W/ RETROGRADES  11/16/2012   Procedure: CYSTOSCOPY WITH RETROGRADE PYELOGRAM;  Surgeon: Reece Packer, MD;  Location: [facility];  Service: Urology;  Laterality: Bilateral;  CYSTOSCOPY,BILATERAL RETROGRADE PYELOGRAM/ REMOVAL LEFT URETERAL STENT/ FULGERATION BLADDER MUCOSA/ INSERTION RIGHT URETERAL STENT  . CYSTOSCOPY W/ URETERAL STENT PLACEMENT  05/28/2012   Procedure: CYSTOSCOPY WITH RETROGRADE PYELOGRAM/URETERAL STENT PLACEMENT;  Surgeon: Reece Packer, MD;  Location: [facility];  Service: Urology;  Laterality: Left;  . CYSTOSCOPY WITH RETROGRADE PYELOGRAM, URETEROSCOPY AND STENT PLACEMENT Right 06/23/2013   Procedure: CYSTOSCOPY WITH RIGHT URETEROSCOPY, RIGHT  RETROGRADE PYELOGRAM, WITH LASER LIPOTRIPSY AND RIGHT URETERAL STENT EXCHANGE ;  Surgeon: Molli Hazard, MD;  Location: [facility];  Service: Urology;  Laterality: Right;  STENT EXCHANGE    . CYSTOSCOPY WITH RETROGRADE PYELOGRAM, URETEROSCOPY AND  STENT PLACEMENT Right 11/06/2013   Procedure: CYSTOSCOPY WITH RETROGRADE PYELOGRAM, URETEROSCOPY STONE REMOVAL AND STENT REMOVAL;  Surgeon: Molli Hazard, MD;  Location: [facility];  Service: Urology;  Laterality: Right;  . HOLMIUM LASER APPLICATION N/A [id number]   Procedure: HOLMIUM LASER APPLICATION;  Surgeon:  Molli Hazard, MD;  Location: [facility];  Service: Urology;  Laterality: N/A;  . INSERTION OF DIALYSIS CATHETER N/A 09/24/2013   Procedure: INSERTION OF DIALYSIS CATHETER;  Surgeon: Rosetta Posner, MD;  Location: [facility] OR;  Service: Vascular;  Laterality: N/A;  . IR AV DIALY SHUNT INTRO NEEDLE/INTRAC INITIAL W/PTA/STENT/IMG LT Left 08/13/2018  . IR AV DIALY SHUNT INTRO NEEDLE/INTRACATH INITIAL W/PTA/IMG LEFT  04/19/2018  . IR AV DIALY SHUNT INTRO NEEDLE/INTRACATH INITIAL W/PTA/IMG LEFT  08/14/2018  . IR FLUORO GUIDE CV LINE RIGHT  09/17/2018  . IR US GUIDE VASC ACCESS LEFT  04/19/2018  . IR US GUIDE VASC ACCESS LEFT  08/13/2018  . IR US GUIDE VASC ACCESS LEFT  08/14/2018  . IR US GUIDE VASC ACCESS RIGHT  09/17/2018  . PORT-A-CATH REMOVAL    . PORTACATH PLACEMENT    . REVISON OF ARTERIOVENOUS FISTULA Left 07/25/2016   Procedure: REVISON OF LEFT ARTERIOVENOUS FISTULA;  Surgeon: Elam Dutch, MD;  Location: [facility] OR;  Service: Vascular;  Laterality: Left;   Family History  Problem Relation Age of Onset  . Coronary artery disease Mother   . Hypertension Mother   . Diabetes type II Mother   . Coronary artery disease Father   . Hypertension Father   . Malignant hyperthermia Father   . Cancer Maternal Grandfather        ? Type  . Kidney failure Maternal Grandmother    Social History:  reports that she quit smoking about 38 years ago. Her smoking use included cigarettes. She has a 0.25 pack-year smoking history. She has never used smokeless tobacco. She reports that she does not drink alcohol or use drugs. Allergies  Allergen Reactions  . Rosiglitazone Other (See Comments)    "Avandia" = Reaction not recalled  . Amoxicillin Rash and Other (See Comments)    Tolerated Zosyn 01/2013. [place] Has patient had a PCN reaction causing immediate rash, facial/tongue/throat swelling, SOB or lightheadedness with hypotension: Yes Has patient had a PCN reaction causing severe rash involving mucus  membranes or skin necrosis: No Has patient had a PCN reaction that required hospitalization: No Has patient had a PCN reaction occurring within the last 10 years: No If all of the above answers are "NO", then may proceed with Cephalosporin use.   Marland Kitchen Amoxicillin-Pot Clavulanate Diarrhea   Prior to Admission medications   Medication Sig Start Date End Date Taking? Authorizing Provider  acetaminophen (TYLENOL) 500 MG tablet Take 1,000 mg by mouth daily as needed for moderate pain.   Yes [provider]  aspirin EC 81 MG tablet Take 81 mg by mouth at bedtime.    Yes [provider]  Calcium Acetate 667 MG TABS Take 667 mg by mouth See admin instructions. Take 667 mg by mouth three times a day with meals and 667 mg with each snack   Yes [provider]  ciprofloxacin (CIPRO) 500 MG tablet Take 1 tablet (500 mg total) by mouth daily. 05/28/18  Yes Tommy Medal, Lavell Islam, MD  Dakins (SODIUM HYPOCHLORITE) 0.25 % SOLN Apply 1 application topically See admin instructions. Apply as directed to groin and buttocks 3 times a day   Yes [provider]  diltiazem (CARDIZEM) 30 MG tablet Take 1 tablet every 4 hours AS NEEDED for rapid heart rate lasting more than 30 mins as long as top blood pressure >100. Patient taking differently: Take 30 mg by mouth every 4 (four) hours as needed (FOR A RAPID HEART RATE LASTING MORE THAN 30 MINUTES AS LONG AS SYSTOLIC ("top number") READING IS >100).  03/09/17  Yes Sherran Needs, NP  diphenhydrAMINE (BENADRYL) 25 MG tablet Take 25 mg by mouth daily as needed for allergies.   Yes [provider]  DULoxetine (CYMBALTA) 60 MG capsule Take 1 capsule (60 mg total) by mouth daily. 12/03/12  Yes Short, Noah Delaine, MD  famotidine (PEPCID) 20 MG tablet TAKE 1 TABLET BY MOUTH AT BEDTIME Patient taking differently: Take 20 mg by mouth at bedtime 10/15/14  Yes Tanda Rockers, MD  feeding supplement, RESOURCE BREEZE, (RESOURCE BREEZE) LIQD Take 1  Container by mouth 2 (two) times daily between meals.    Yes [provider]  glipiZIDE (GLUCOTROL) 5 MG tablet Take 0.5 tablets (2.5 mg total) by mouth daily. 08/31/16  Yes Debbe Odea, MD  levothyroxine (SYNTHROID, LEVOTHROID) 75 MCG tablet Take 75 mcg by mouth daily. 08/17/15  Yes [provider]  midodrine (PROAMATINE) 10 MG tablet Take 1 tablet (10 mg total) by mouth 3 (three) times daily. Patient taking differently: Take 10 mg by mouth every Monday, Wednesday, and Friday.  08/31/16  Yes Debbe Odea, MD  multivitamin (RENA-VIT) TABS tablet Take 1 tablet by mouth daily. 08/16/15  Yes [provider]  oxyCODONE-acetaminophen (PERCOCET/ROXICET) 5-325 MG tablet Take 1-2 tablets by mouth every 6 (six) hours as needed. Patient has not taken since started dialysis 08/2013 due to causes hypotension Patient taking differently: Take 2 tablets by mouth every 6 (six) hours as needed for moderate pain.  07/25/16  Yes Alvia Grove, PA-C  oxymetazoline (AFRIN) 0.05 % nasal spray Place 1 spray into both nostrils once as needed for congestion.    Yes [provider]  pravastatin (PRAVACHOL) 40 MG tablet Take 40 mg by mouth at bedtime.  12/03/12  Yes Short, Noah Delaine, MD  Probiotic Product (PROBIOTIC DAILY PO) Take 1 capsule by mouth 2 (two) times daily.    Yes [provider]  sevelamer carbonate (RENVELA) 800 MG tablet Take 2,400 mg by mouth 3 (three) times daily with meals.   Yes [provider]  sucroferric oxyhydroxide (VELPHORO) 500 MG chewable tablet Chew 1,000 mg by mouth 3 (three) times daily with meals.    Yes [provider]  doxycycline (VIBRA-TABS) 100 MG tablet Take 1 tablet (100 mg total) by mouth 2 (two) times daily. Patient not taking: Reported on 09/17/2018 05/28/18   Tommy Medal, Lavell Islam, MD  DYMISTA 137-50 MCG/ACT SUSP PLACE 2 SPRAYS INTO THE NOSE DAILY. Patient not taking: No sig reported 02/06/18   Marshell Garfinkel, MD   Current  Facility-Administered Medications  Medication Dose Route Frequency Provider Last Rate Last Dose  . 0.9 %  sodium chloride infusion  250 mL Intravenous PRN Gala Romney L, MD      . acetaminophen (TYLENOL) tablet 1,000 mg  1,000 mg Oral Daily PRN Gala Romney L, MD      . acidophilus (RISAQUAD) capsule 1 capsule  1 capsule Oral BID Elwyn Reach, MD   1 capsule at 09/17/18 2358  . aspirin EC tablet 81 mg  81 mg Oral QHS Elwyn Reach, MD   81 mg at 09/17/18 2357  . calcitRIOL (ROCALTROL)  capsule 3.5 mcg  3.5 mcg Oral Q M,W,F-HD Valentina Gu, NP      . calcium acetate (PHOSLO) capsule 667 mg  667 mg Oral TID WC Gala Romney L, MD      . calcium acetate (PHOSLO) capsule 667 mg  667 mg Oral BID PRN Elwyn Reach, MD      . Chlorhexidine Gluconate Cloth 2 % PADS 6 each  6 each Topical Q0600 Valentina Gu, NP      . cinacalcet [facility]) tablet 120 mg  120 mg Oral Q M,W,F-HD Valentina Gu, NP      . ciprofloxacin (CIPRO) tablet 500 mg  500 mg Oral Daily Elwyn Reach, MD      . Darbepoetin Alfa (ARANESP) injection 100 mcg  100 mcg Intravenous Q Wed-HD Valentina Gu, NP      . diltiazem (CARDIZEM) tablet 30 mg  30 mg Oral Q4H PRN Elwyn Reach, MD      . diphenhydrAMINE (BENADRYL) capsule 25 mg  25 mg Oral Daily PRN Gala Romney L, MD      . DULoxetine (CYMBALTA) DR capsule 60 mg  60 mg Oral Daily Garba, Mohammad L, MD      . famotidine (PEPCID) tablet 20 mg  20 mg Oral QHS Gala Romney L, MD   20 mg at 09/17/18 2357  . feeding supplement (BOOST / RESOURCE BREEZE) liquid 1 Container  1 Container Oral BID BM Garba, Mohammad L, MD      . glipiZIDE (GLUCOTROL) tablet 2.5 mg  2.5 mg Oral Q breakfast Garba, Mohammad L, MD      . insulin aspart (novoLOG) injection 0-5 Units  0-5 Units Subcutaneous QHS Garba, Mohammad L, MD      . insulin aspart (novoLOG) injection 0-9 Units  0-9 Units Subcutaneous TID WC Garba, Mohammad L, MD      .  levothyroxine (SYNTHROID, LEVOTHROID) tablet 75 mcg  75 mcg Oral QAC breakfast Garba, Mohammad L, MD      . midodrine (PROAMATINE) tablet 10 mg  10 mg Oral Q M,W,F Garba, Mohammad L, MD      . multivitamin (RENA-VIT) tablet 1 tablet  1 tablet Oral QHS Elwyn Reach, MD   1 tablet at 09/17/18 2358  . ondansetron (ZOFRAN) tablet 4 mg  4 mg Oral Q6H PRN Elwyn Reach, MD       Or  . ondansetron (ZOFRAN) injection 4 mg  4 mg Intravenous Q6H PRN Gala Romney L, MD      . oxyCODONE-acetaminophen (PERCOCET/ROXICET) 5-325 MG per tablet 2 tablet  2 tablet Oral Q6H PRN Gala Romney L, MD      . pravastatin (PRAVACHOL) tablet 40 mg  40 mg Oral QHS Gala Romney L, MD   40 mg at 09/17/18 2357  . sevelamer carbonate (RENVELA) tablet 2,400 mg  2,400 mg Oral TID WC Garba, Mohammad L, MD      . sodium chloride flush (NS) 0.9 % injection 3 mL  3 mL Intravenous Q12H Garba, Mohammad L, MD      . sodium chloride flush (NS) 0.9 % injection 3 mL  3 mL Intravenous PRN Jonelle Sidle, Mohammad L, MD      . sodium hypochlorite (DAKIN'S 1/2 STRENGTH) topical solution 1 application  1 application Topical TID Elwyn Reach, MD      . sucroferric oxyhydroxide (VELPHORO) chewable tablet 1,000 mg  1,000 mg Oral TID WC Elwyn Reach, MD  Labs: Basic Metabolic Panel: Recent Labs  Lab 09/14/18 0637 09/17/18 1206 09/18/18 0233 09/18/18 0429  NA 133* 136  --  136  K 3.8 4.8  --  5.4*  CL 96* 100  --  98  CO2 26 23  --  21*  GLUCOSE 176* 113*  --  109*  BUN 37* 91*  --  95*  CREATININE 5.80* 12.30*  --  13.36*  CALCIUM 8.9 9.0  --  8.9  PHOS  --   --  8.6*  --    Liver Function Tests: Recent Labs  Lab 09/18/18 0429  AST 13*  ALT 11  ALKPHOS 85  BILITOT 1.2  PROT 8.5*  ALBUMIN 2.8*   No results for input(s): LIPASE, AMYLASE in the last 168 hours. No results for input(s): AMMONIA in the last 168 hours. CBC: Recent Labs  Lab 09/14/18 0637 09/17/18 1206 09/18/18 0429  WBC 16.5* 25.7*  18.3*  NEUTROABS 13.7*  --   --   HGB 8.7* 4.9* 9.7*  HCT 31.7* 17.7* 33.7*  MCV 101.6* 101.1* 95.7  PLT 266 419* 332   Cardiac Enzymes: No results for input(s): CKTOTAL, CKMB, CKMBINDEX, TROPONINI in the last 168 hours. CBG: Recent Labs  Lab 09/17/18 1223 09/17/18 2312 09/18/18 0816 09/18/18 1324  GLUCAP 99 141* 109* 150*   Iron Studies: No results for input(s): IRON, TIBC, TRANSFERRIN, FERRITIN in the last 72 hours. Studies/Results: Ir Fluoro Guide Cv Line Right  Result Date: 09/17/2018 CLINICAL DATA:  Occluded hemodialysis fistula. Patient presented for declot, but patient was found to be severely anemic with elevated WBC count. EXAM: EXAM RIGHT IJ CATHETER PLACEMENT UNDER ULTRASOUND AND FLUOROSCOPIC GUIDANCE TECHNIQUE: The procedure, risks (including but not limited to bleeding, infection, organ damage, pneumothorax), benefits, and alternatives were explained to the patient. Questions regarding the procedure were encouraged and answered. The patient understands and consents to the procedure.On a patency of the right IJ vein was confirmed with ultrasound with image documentation. An appropriate skin site was determined. Skin site was marked. Region was prepped using maximum barrier technique including cap and mask, sterile gown, sterile gloves, large sterile sheet, and Chlorhexidine as cutaneous antisepsis. The region was infiltrated locally with 1% lidocaine. Under real-time ultrasound guidance, the right IJ vein was accessed with a 19 gauge needle; the needle tip within the vein was confirmed with ultrasound image documentation. The needle exchanged over a guidewire for vascular dilator which allowed advancement of a 20 cm Trialysis catheter. This was positioned with the tip at the cavoatrial junction. Spot chest radiograph shows good positioning and no pneumothorax. Catheter was flushed and sutured externally with 0-Prolene sutures. Patient tolerated the procedure well. FLUOROSCOPY TIME:   1.1 minute; 101 uGym2 DAP COMPLICATIONS: COMPLICATIONS none IMPRESSION: 1. Technically successful right IJ Trialysis catheter placement. Electronically Signed   By: Lucrezia Europe M.D.   On: 09/17/2018 15:52   Ir US Guide Vasc Access Right  Result Date: 09/17/2018 CLINICAL DATA:  Occluded hemodialysis fistula. Patient presented for declot, but patient was found to be severely anemic with elevated WBC count. EXAM: EXAM RIGHT IJ CATHETER PLACEMENT UNDER ULTRASOUND AND FLUOROSCOPIC GUIDANCE TECHNIQUE: The procedure, risks (including but not limited to bleeding, infection, organ damage, pneumothorax), benefits, and alternatives were explained to the patient. Questions regarding the procedure were encouraged and answered. The patient understands and consents to the procedure.On a patency of the right IJ vein was confirmed with ultrasound with image documentation. An appropriate skin site was determined. Skin site was marked.  Region was prepped using maximum barrier technique including cap and mask, sterile gown, sterile gloves, large sterile sheet, and Chlorhexidine as cutaneous antisepsis. The region was infiltrated locally with 1% lidocaine. Under real-time ultrasound guidance, the right IJ vein was accessed with a 19 gauge needle; the needle tip within the vein was confirmed with ultrasound image documentation. The needle exchanged over a guidewire for vascular dilator which allowed advancement of a 20 cm Trialysis catheter. This was positioned with the tip at the cavoatrial junction. Spot chest radiograph shows good positioning and no pneumothorax. Catheter was flushed and sutured externally with 0-Prolene sutures. Patient tolerated the procedure well. FLUOROSCOPY TIME:  1.1 minute; 161 uGym2 DAP COMPLICATIONS: COMPLICATIONS none IMPRESSION: 1. Technically successful right IJ Trialysis catheter placement. Electronically Signed   By: Lucrezia Europe M.D.   On: 09/17/2018 15:52   Dg Chest Portable 1 View  Result Date:  09/17/2018 CLINICAL DATA:  Tachycardia, elevated WBC. Pt denies chest complaints at this time. Pt currently wearing oxygen. EXAM: PORTABLE CHEST 1 VIEW COMPARISON:  02/02/2017 FINDINGS: RIGHT-sided IJ central line has been placed, tip probably at the level of the UPPER RIGHT atrium. No pneumothorax. The heart is enlarged. There is pulmonary vascular congestion and RIGHT LOWER lobe atelectasis. IMPRESSION: Interval placement of RIGHT IJ central line, tip probably to level of the UPPER RIGHT atrium. Electronically Signed   By: Nolon Nations M.D.   On: 09/17/2018 16:52    ROS: As per HPI otherwise negative.   Physical Exam: Vitals:   09/18/18 0818 09/18/18 1007 09/18/18 1200 09/18/18 1254  BP: (!) 154/74 136/82 (!) 136/46 (!) 146/77  Pulse: 73 85 (!) 138 86  Resp: 18 20 (!) 34 (!) 28  Temp: (!) 97.4 F (36.3 C) 98.3 F (36.8 C)  98.1 F (36.7 C)  TempSrc: Oral Oral  Oral  SpO2: 99%   99%  Weight:      Height:         General: Morbidly obese female in no acute distress. Head: Normocephalic, atraumatic, sclera non-icteric, mucus membranes are moist Neck: Supple. JVD not elevated. Lungs: Clear bilaterally to auscultation without wheezes, rales, or rhonchi. Breathing is unlabored. Heart: HS distant. RRR with S1 S2. No murmurs, rubs, or gallops appreciated. Abdomen: Soft, non-tender, non-distended with normoactive bowel sounds. No rebound/guarding. No obvious abdominal masses. M-S:  Strength and tone appear normal for age. Lower extremities:without edema or ischemic changes, no open wounds  Neuro: Alert and oriented X 3. Moves all extremities spontaneously. Psych:  Responds to questions appropriately with a normal affect. Dialysis Access: Temporary IJ HD cath. No blood return to arterial port. Drsg intact. L AVF no bruit.   Dialysis Orders: MWF [facility] 4.5 hr 200NRe 500/800 154.5 kg 2.0K/2.25 Ca  -Heparin 5000 units IV initial bolus, Heparin 5000 units IV mid run -Mircera 225 mcg IV q 2  weeks (last dose 09/04/2018 Last HGB 8.8 09/11/2018)  Assessment/Plan: 1.  Clotted AVF-awaiting declot. Unable to use temporary catheter. Concern for placement after patient sustained SVT while on HD. Checking CXR. Dr. Trula Slade to place [facility] today.  2.  ABLA-bleeding from AVF (09/14/2018) HGB 4.9. Rec'd 3 units PRBCs. HGB 9.7.  3.  ESRD -  MWF-No HD since 09/13/2018. K+ 5.4. HD whenever we have access.  4.  Hypertension/volume  - BP controlled. Attempt 2.5-3.5 liters. Has not been getting to EDW at OP center. May need to raise.  5.  Anemia  - See above. ESA due this week. Give with HD today.  6.  Metabolic bone disease -  Continue binders, VDRA 7.  Nutrition - NPO at present.  8.  Chronic hidrenitis suppuritiva-ongoing issue.  9.  H/O Afib. Episode of SVT terminated when HD stopped. Now SR. Not on anticoagulation D/T chronically low HGB.   Rita H. Owens Shark, NP-C 09/18/2018, 1:31 PM  [employer] [phone]   I have seen and examined this patient and agree with plan and assessment in the above note with renal recommendations/intervention highlighted.  Pt seen while on HD and was dizzy with tachycardia with rate in the 160's.  Poorly functioning temp HD cath and nurse ordered to rinse patient back.  Consulted [facility] for [facility] placement and eventual attempt to declot AVG.  Also asked primary svc to have cardiology evaluate her arrythemia (possibly related to temp HD cath).  Broadus John A Maddison Kilner,MD 09/18/2018 2:27 PM

## 2018-09-19 DIAGNOSIS — Z09 Encounter for follow-up examination after completed treatment for conditions other than malignant neoplasm: Secondary | ICD-10-CM

## 2018-09-19 DIAGNOSIS — T8249XA Other complication of vascular dialysis catheter, initial encounter: Secondary | ICD-10-CM

## 2018-09-19 LAB — RENAL FUNCTION PANEL
ALBUMIN: 2.9 g/dL — AB (ref 3.5–5.0)
ANION GAP: 14 (ref 5–15)
BUN: 41 mg/dL — ABNORMAL HIGH (ref 6–20)
CALCIUM: 8.4 mg/dL — AB (ref 8.9–10.3)
CO2: 22 mmol/L (ref 22–32)
Chloride: 96 mmol/L — ABNORMAL LOW (ref 98–111)
Creatinine, Ser: 7.83 mg/dL — ABNORMAL HIGH (ref 0.44–1.00)
GFR, EST AFRICAN AMERICAN: 6 mL/min — AB (ref >60)
GFR, EST NON AFRICAN AMERICAN: 5 mL/min — AB (ref >60)
Glucose, Bld: 155 mg/dL — ABNORMAL HIGH (ref 70–99)
POTASSIUM: 4.4 mmol/L (ref 3.5–5.1)
Phosphorus: 5.3 mg/dL — ABNORMAL HIGH (ref 2.5–4.6)
Sodium: 132 mmol/L — ABNORMAL LOW (ref 135–145)

## 2018-09-19 LAB — BPAM RBC
BLOOD PRODUCT EXPIRATION DATE: 201911182359
BLOOD PRODUCT EXPIRATION DATE: 201911182359
Blood Product Expiration Date: 201911182359
ISSUE DATE / TIME: 201910222123
ISSUE DATE / TIME: 201910230041
ISSUE DATE / TIME: 201910221718
UNIT TYPE AND RH: 7300
Unit Type and Rh: 7300
Unit Type and Rh: 7300

## 2018-09-19 LAB — TYPE AND SCREEN
ABO/RH(D): B POS
ANTIBODY SCREEN: NEGATIVE
UNIT DIVISION: 0
Unit division: 0
Unit division: 0

## 2018-09-19 LAB — CBC
HEMATOCRIT: 36.1 % (ref 36.0–46.0)
HEMOGLOBIN: 10.4 g/dL — AB (ref 12.0–15.0)
MCH: 27.5 pg (ref 26.0–34.0)
MCHC: 28.8 g/dL — ABNORMAL LOW (ref 30.0–36.0)
MCV: 95.5 fL (ref 80.0–100.0)
NRBC: 0 % (ref 0.0–0.2)
Platelets: 304 10*3/uL (ref 150–400)
RBC: 3.78 MIL/uL — ABNORMAL LOW (ref 3.87–5.11)
RDW: 17.6 % — AB (ref 11.5–15.5)
WBC: 16.7 10*3/uL — ABNORMAL HIGH (ref 4.0–10.5)

## 2018-09-19 LAB — GLUCOSE, CAPILLARY
GLUCOSE-CAPILLARY: 161 mg/dL — AB (ref 70–99)
GLUCOSE-CAPILLARY: 112 mg/dL — AB (ref 70–99)
Glucose-Capillary: 96 mg/dL (ref 70–99)
Glucose-Capillary: 166 mg/dL — ABNORMAL HIGH (ref 70–99)

## 2018-09-19 MED ORDER — HEPARIN SODIUM (PORCINE) 1000 UNIT/ML IJ SOLN
INTRAMUSCULAR | Status: AC
  Administered 2018-09-19: 2800 [IU] via INTRAVENOUS_CENTRAL
  Filled 2018-09-19: qty 3

## 2018-09-19 MED ORDER — CHLORHEXIDINE GLUCONATE CLOTH 2 % EX PADS
6.0000 | MEDICATED_PAD | Freq: Every day | CUTANEOUS | Status: DC
  Administered 2018-09-19 – 2018-09-20 (×2): 6 via TOPICAL

## 2018-09-19 MED ORDER — HEPARIN SODIUM (PORCINE) 1000 UNIT/ML DIALYSIS
2800.0000 [IU] | INTRAMUSCULAR | Status: DC | PRN
  Administered 2018-09-19: 2800 [IU] via INTRAVENOUS_CENTRAL
  Filled 2018-09-19 (×2): qty 3

## 2018-09-19 NOTE — Progress Notes (Addendum)
[facility] Progress Note   Subjective: Was scheduled yesterday for [facility] placement per Dr. Trula Slade. Patient ate bagel in spite of being told she was NPO and procedure was canceled. Was supposed to have declot and placement of TDC in IR this AM but NPO order not carried over and patient ate breakfast. Procedure was rescheduled for later this afternoon, walked into to pt's room, found her eating graham crackers and drinking ginger ale. Patient says her BS was dropping and she needed to eat. She has been told by primary, Dr. Marval Regal and myself she was not supposed to eat. IR notified.  Objective Vitals:   09/19/18 0052 09/19/18 0409 09/19/18 0903 09/19/18 1100  BP: 136/82 (!) 122/58 140/76 (!) 140/91  Pulse: 88 80  94  Resp: 15 (!) 22  (!) 24  Temp: 97.8 F (36.6 C) 98.2 F (36.8 C) 98 F (36.7 C) 97.7 F (36.5 C)  TempSrc: Oral Oral Oral Oral  SpO2: 100% 100%    Weight: (!) 155 kg     Height:       Physical Exam General: Obese, NAD Heart: HS distant Lungs: CTAB Abdomen: Obese, active BS.  Extremities: No LE edema. Dialysis Access: RIJ temp cath drsg CDI. L AVF No bruit   Additional Objective Labs: Basic Metabolic Panel: Recent Labs  Lab 09/17/18 1206 09/18/18 0233 09/18/18 0429 09/19/18 0425  NA 136  --  136 132*  K 4.8  --  5.4* 4.4  CL 100  --  98 96*  CO2 23  --  21* 22  GLUCOSE 113*  --  109* 155*  BUN 91*  --  95* 41*  CREATININE 12.30*  --  13.36* 7.83*  CALCIUM 9.0  --  8.9 8.4*  PHOS  --  8.6*  --  5.3*   Liver Function Tests: Recent Labs  Lab 09/18/18 0429 09/19/18 0425  AST 13*  --   ALT 11  --   ALKPHOS 85  --   BILITOT 1.2  --   PROT 8.5*  --   ALBUMIN 2.8* 2.9*   No results for input(s): LIPASE, AMYLASE in the last 168 hours. CBC: Recent Labs  Lab 09/14/18 0637 09/17/18 1206 09/18/18 0429 09/19/18 0425  WBC 16.5* 25.7* 18.3* 16.7*  NEUTROABS 13.7*  --   --   --   HGB 8.7* 4.9* 9.7* 10.4*  HCT 31.7* 17.7* 33.7* 36.1   MCV 101.6* 101.1* 95.7 95.5  PLT 266 419* 332 304   Blood Culture    Component Value Date/Time   SDES BLOOD RIGHT FOREARM 09/17/2018 1713   SPECREQUEST  09/17/2018 1713    BOTTLES DRAWN AEROBIC AND ANAEROBIC Blood Culture adequate volume   CULT  09/17/2018 1713    NO GROWTH 2 DAYS Performed at [facility] [address], [place], [place] [address]    REPTSTATUS PENDING 09/17/2018 1713    Cardiac Enzymes: No results for input(s): CKTOTAL, CKMB, CKMBINDEX, TROPONINI in the last 168 hours. CBG: Recent Labs  Lab 09/17/18 2312 09/18/18 0816 09/18/18 1324 09/18/18 1634 09/19/18 0756  GLUCAP 141* 109* 150* 161* 96   Iron Studies: No results for input(s): IRON, TIBC, TRANSFERRIN, FERRITIN in the last 72 hours. @lablastinr3 @ Studies/Results: Ir Fluoro Guide Cv Line Right  Result Date: 09/17/2018 CLINICAL DATA:  Occluded hemodialysis fistula. Patient presented for declot, but patient was found to be severely anemic with elevated WBC count. EXAM: EXAM RIGHT IJ CATHETER PLACEMENT UNDER ULTRASOUND AND FLUOROSCOPIC GUIDANCE TECHNIQUE: The procedure,  risks (including but not limited to bleeding, infection, organ damage, pneumothorax), benefits, and alternatives were explained to the patient. Questions regarding the procedure were encouraged and answered. The patient understands and consents to the procedure.On a patency of the right IJ vein was confirmed with ultrasound with image documentation. An appropriate skin site was determined. Skin site was marked. Region was prepped using maximum barrier technique including cap and mask, sterile gown, sterile gloves, large sterile sheet, and Chlorhexidine as cutaneous antisepsis. The region was infiltrated locally with 1% lidocaine. Under real-time ultrasound guidance, the right IJ vein was accessed with a 19 gauge needle; the needle tip within the vein was confirmed with ultrasound image documentation. The needle exchanged over a guidewire for  vascular dilator which allowed advancement of a 20 cm Trialysis catheter. This was positioned with the tip at the cavoatrial junction. Spot chest radiograph shows good positioning and no pneumothorax. Catheter was flushed and sutured externally with 0-Prolene sutures. Patient tolerated the procedure well. FLUOROSCOPY TIME:  1.1 minute; 856 uGym2 DAP COMPLICATIONS: COMPLICATIONS none IMPRESSION: 1. Technically successful right IJ Trialysis catheter placement. Electronically Signed   By: Lucrezia Europe M.D.   On: 09/17/2018 15:52   Ir US Guide Vasc Access Right  Result Date: 09/17/2018 CLINICAL DATA:  Occluded hemodialysis fistula. Patient presented for declot, but patient was found to be severely anemic with elevated WBC count. EXAM: EXAM RIGHT IJ CATHETER PLACEMENT UNDER ULTRASOUND AND FLUOROSCOPIC GUIDANCE TECHNIQUE: The procedure, risks (including but not limited to bleeding, infection, organ damage, pneumothorax), benefits, and alternatives were explained to the patient. Questions regarding the procedure were encouraged and answered. The patient understands and consents to the procedure.On a patency of the right IJ vein was confirmed with ultrasound with image documentation. An appropriate skin site was determined. Skin site was marked. Region was prepped using maximum barrier technique including cap and mask, sterile gown, sterile gloves, large sterile sheet, and Chlorhexidine as cutaneous antisepsis. The region was infiltrated locally with 1% lidocaine. Under real-time ultrasound guidance, the right IJ vein was accessed with a 19 gauge needle; the needle tip within the vein was confirmed with ultrasound image documentation. The needle exchanged over a guidewire for vascular dilator which allowed advancement of a 20 cm Trialysis catheter. This was positioned with the tip at the cavoatrial junction. Spot chest radiograph shows good positioning and no pneumothorax. Catheter was flushed and sutured externally with  0-Prolene sutures. Patient tolerated the procedure well. FLUOROSCOPY TIME:  1.1 minute; 314 uGym2 DAP COMPLICATIONS: COMPLICATIONS none IMPRESSION: 1. Technically successful right IJ Trialysis catheter placement. Electronically Signed   By: Lucrezia Europe M.D.   On: 09/17/2018 15:52   Dg Chest Port 1 View  Result Date: 09/18/2018 CLINICAL DATA:  Right IJ dialysis catheter reposition EXAM: PORTABLE CHEST 1 VIEW COMPARISON:  09/18/2018 FINDINGS: Right IJ dialysis catheter has been retracted into the SVC RA junction level. Stable cardiomegaly with vascular congestion and basilar atelectasis. Right hemidiaphragm elevated. No large effusion or pneumothorax. Trachea is midline. Degenerative changes of the spine. Left cephalic arch vascular stents evident. IMPRESSION: Right IJ temporary dialysis catheter tip SVC RA junction. Stable cardiomegaly with vascular congestion and basilar atelectasis. Electronically Signed   By: Jerilynn Mages.  Shick M.D.   On: 09/18/2018 18:58   Dg Chest Port 1 View  Result Date: 09/18/2018 CLINICAL DATA:  Short of breath. Encounter for central line placement. EXAM: PORTABLE CHEST 1 VIEW COMPARISON:  09/17/2018 FINDINGS: Double-lumen right internal jugular central venous line is without change from the  prior study. Tip projects in the right atrium. There is vascular congestion with mild right basilar atelectasis similar to the previous day's study. No new lung abnormalities. No pneumothorax. IMPRESSION: 1. No significant change from the previous day's study. 2. Right double-lumen center venous catheter tip projects in the right atrium. No pneumothorax. 3. Vascular congestion and right basilar atelectasis. Electronically Signed   By: Lajean Manes M.D.   On: 09/18/2018 14:33   Dg Chest Portable 1 View  Result Date: 09/17/2018 CLINICAL DATA:  Tachycardia, elevated WBC. Pt denies chest complaints at this time. Pt currently wearing oxygen. EXAM: PORTABLE CHEST 1 VIEW COMPARISON:  02/02/2017 FINDINGS:  RIGHT-sided IJ central line has been placed, tip probably at the level of the UPPER RIGHT atrium. No pneumothorax. The heart is enlarged. There is pulmonary vascular congestion and RIGHT LOWER lobe atelectasis. IMPRESSION: Interval placement of RIGHT IJ central line, tip probably to level of the UPPER RIGHT atrium. Electronically Signed   By: Nolon Nations M.D.   On: 09/17/2018 16:52   Medications: . sodium chloride     . acidophilus  1 capsule Oral BID  . aspirin EC  81 mg Oral QHS  . calcitRIOL  3.5 mcg Oral Q M,W,F-HD  . calcium acetate  667 mg Oral TID WC  . Chlorhexidine Gluconate Cloth  6 each Topical Q0600  . cinacalcet  120 mg Oral Q M,W,F-HD  . ciprofloxacin  500 mg Oral Daily  . darbepoetin (ARANESP) injection - DIALYSIS  100 mcg Intravenous Q Wed-HD  . doxycycline  100 mg Oral Q12H  . DULoxetine  60 mg Oral Daily  . famotidine  20 mg Oral QHS  . feeding supplement  1 Container Oral BID BM  . glipiZIDE  2.5 mg Oral Q breakfast  . insulin aspart  0-5 Units Subcutaneous QHS  . insulin aspart  0-9 Units Subcutaneous TID WC  . levothyroxine  75 mcg Oral QAC breakfast  . midodrine  10 mg Oral Q M,W,F  . multivitamin  1 tablet Oral QHS  . pravastatin  40 mg Oral QHS  . sevelamer carbonate  2,400 mg Oral TID WC  . sodium chloride flush  3 mL Intravenous Q12H  . sodium hypochlorite  1 application Topical TID  . sucroferric oxyhydroxide  1,000 mg Oral TID WC     Dialysis Orders: MWF [facility] 4.5 hr 200NRe 500/800 154.5 kg 2.0K/2.25 Ca  -Heparin 5000 units IV initial bolus, Heparin 5000 units IV mid run -Mircera 225 mcg IV q 2 weeks (last dose 09/04/2018 Last HGB 8.8 09/11/2018)  Assessment/Plan: 1.  Clotted AVF-Awaiting declot of AVF. Again ate and again procedure has been rescheduled. Discussed with IR-will place [facility] and attempt declot 09/19/18. T-Cath adjusted per IR-able to use to HD last PM.  2.  ABLA-bleeding from AVF (09/14/2018) HGB 4.9. Rec'd 3 units PRBCs. HGB 10.4   3.  ESRD -  MWF-HD last PM via T-Cath. Next HD 10/25.  4.  Hypertension/volume  - HD last PM Pre wt158 kg Net UF 3039 Post wt 155 kg. Close to OP EDW. 1.5-2 liters with HD tomorrow.   5. Anemia  - See above. Rec'd Aranesp 100 mcg IV 09/19/18.   6. Metabolic bone disease -  Continue binders, VDRA 7.  Nutrition - Renal/Carb mod diet. NPO at MN.  8.  Chronic hidrenitis suppuritiva-ongoing issue.  9.   H/O Afib. Episode of SVT terminated when HD stopped. Now SR. Not on anticoagulation D/T chronically low HGB.   10.  DM-stop oral glipizide while in hospital. Use S/S insulin only.   Rita H. Brown NP-C 09/19/2018, 11:42 AM  [employer] [phone]   I have seen and examined this patient and agree with plan and assessment in the above note with renal recommendations/intervention highlighted.  She continues to eat despite being told that she cannot eat before the procedure.  Plan for [facility] tomorrow per IR and attempt at declot although unlikely per Dr. Pascal Lux.  Broadus John A Nesta Kimple,MD 09/19/2018 12:50 PM

## 2018-09-19 NOTE — Progress Notes (Signed)
[facility]                                                                              Patient Demographics  [name], is a 57 y.o. female, DOB - 1961-06-21, BJS:[id number]  Admit date - 09/17/2018   Admitting Physician Elwyn Reach, MD  Outpatient Primary MD for the patient is Harlan Stains, MD  Outpatient specialists:   LOS - 2  days   Medical records reviewed and are as summarized below:    Chief Complaint  Patient presents with  . Anemia  . Needs Dialysis       Brief summary   Patient is a 57 year old female with ESRD on hemodialysis MWF, diabetes, morbid obesity, depression, hypertension, hyperkalemia presented to ED for anemia.  He apparently patient had significant bleeding from her left upper extremity fistula 4 days prior to admission.  Hemoglobin dropped to 4.9.  Patient attempted to have hemodialysis on Monday 10/21 however the left upper extremity fistula clotted.  She was sent over for declotting procedure and was not deemed unsafe for the procedure with hemoglobin of 4.9.  Patient was sent to the ED.  Temporary IJ cath was placed.  Patient was tachycardiac and Thursday and like weakness.  She was admitted for further work-up.   Assessment & Plan    Principal Problem:   Symptomatic anemia -Status post transfusion 3 units of packed RBCs, hemoglobin 4.9 at the time of admission -FOBT negative. -H&H currently stable 10.4  Active Problems:  ESRD on hemodialysis with access issues -Currently having access issues with left upper extremity fistula clotted and nonfunctioning temporary TDC -Highly appreciate IR for fixing the existing right IJ [facility], patient underwent hemodialysis last night -Patient had declotting procedure and TDC placement by vascular surgery on 10/23 however she ate a bagel despite being strict n.p.o.  Surgery was planned today, the NPO order did not carryover on sign and held with transfer orders however patient knew  about her surgery today.  She ate her breakfast.  She was made strict n.p.o. subsequently but found to be eating graham crackers and drinking ginger ale by nephrology this afternoon. -She continues to remain medically noncompliant despite multiple recommendations from all specialties involved.  Now n.p.o. after midnight for [facility] placement in a.m.   SVT today, transient, history of paroxysmal atrial fibrillation -Followed in A. fib clinic, by Dr. Golden Hurter, cardiology -Remained stable, no further episodes. -Patient has been considered not to be a anticoagulation candidate due to open skin lesions secondary to hidradenitis and causes constant bleeding and oozing. Per cardiology, patient cannot be on long-acting CCB or beta-blocker due to issues with hypotension during hemodialysis -Currently stable, if repeat episodes, will consult cardiology  History of hiradenitis on the groin and buttocks -Wounds on the buttocks examined, per patient she has required IV antibiotics in the past.  - Presented with white count of 25.7, improving to 16.7 today, continue doxycycline  History of orthostatic hypotension -Patient has a history of orthostatic hypotension during hemodialysis and has been controlled with midodrine 10 mg every MWF with dialysis.  Chronic diastolic CHF -Currently has some volume overload, needs  HD  Morbid obesity -Patient counseled on diet and weight control   Code Status: Full code DVT Prophylaxis:  SCD's Family Communication: Discussed in detail with the patient, all imaging results, lab results explained to the patient and husband at the bedside.   Disposition Plan: To remain inpatient, high risk of deterioration due to volume overload, need of hemodialysis but having issues with access.  Continue monitoring in stepdown unit  Time Spent in minutes   25 minutes  Procedures:  Nephrology  Consultants:   Nephrology Vascular surgery Interventional  radiology  Antimicrobials:      Medications  Scheduled Meds: . acidophilus  1 capsule Oral BID  . aspirin EC  81 mg Oral QHS  . calcitRIOL  3.5 mcg Oral Q M,W,F-HD  . calcium acetate  667 mg Oral TID WC  . Chlorhexidine Gluconate Cloth  6 each Topical Q0600  . Chlorhexidine Gluconate Cloth  6 each Topical Q0600  . cinacalcet  120 mg Oral Q M,W,F-HD  . ciprofloxacin  500 mg Oral Daily  . darbepoetin (ARANESP) injection - DIALYSIS  100 mcg Intravenous Q Wed-HD  . doxycycline  100 mg Oral Q12H  . DULoxetine  60 mg Oral Daily  . famotidine  20 mg Oral QHS  . feeding supplement  1 Container Oral BID BM  . insulin aspart  0-5 Units Subcutaneous QHS  . insulin aspart  0-9 Units Subcutaneous TID WC  . levothyroxine  75 mcg Oral QAC breakfast  . midodrine  10 mg Oral Q M,W,F  . multivitamin  1 tablet Oral QHS  . pravastatin  40 mg Oral QHS  . sevelamer carbonate  2,400 mg Oral TID WC  . sodium chloride flush  3 mL Intravenous Q12H  . sodium hypochlorite  1 application Topical TID  . sucroferric oxyhydroxide  1,000 mg Oral TID WC   Continuous Infusions: . sodium chloride     PRN Meds:.sodium chloride, acetaminophen, calcium acetate, diltiazem, diphenhydrAMINE, heparin, ondansetron **OR** ondansetron (ZOFRAN) IV, oxyCODONE-acetaminophen, sodium chloride flush   Antibiotics   Anti-infectives (From admission, onward)   Start     Dose/Rate Route Frequency Ordered Stop   09/18/18 1500  doxycycline (VIBRA-TABS) tablet 100 mg     100 mg Oral Every 12 hours 09/18/18 1438     09/18/18 1000  ciprofloxacin (CIPRO) tablet 500 mg     500 mg Oral Daily 09/17/18 2230          Subjective:   Angelika Jerrett was seen and examined today.  No complaints per the patient, no further SVT episodes.  Husband at the bedside, patient eating breakfast and ordering lunch at the time of my encounter. Patient denies dizziness, chest pain, shortness of breath, abdominal pain, N/V/D/C, new weakness,  numbess, tingling.  Objective:   Vitals:   09/19/18 0052 09/19/18 0409 09/19/18 0903 09/19/18 1100  BP: 136/82 (!) 122/58 140/76 (!) 140/91  Pulse: 88 80  94  Resp: 15 (!) 22  (!) 24  Temp: 97.8 F (36.6 C) 98.2 F (36.8 C) 98 F (36.7 C) 97.7 F (36.5 C)  TempSrc: Oral Oral Oral Oral  SpO2: 100% 100%    Weight: (!) 155 kg     Height:        Intake/Output Summary (Last 24 hours) at 09/19/2018 1414 Last data filed at 09/19/2018 1300 Gross per 24 hour  Intake 482 ml  Output 3039 ml  Net -2557 ml     Wt Readings from Last 3 Encounters:  09/19/18 Marland Kitchen)  155 kg  08/14/18 (!) 158.8 kg  06/27/18 136.1 kg     Exam    General: Alert and oriented x 3, NAD  Eyes:   HEENT:  Atraumatic, normocephalic  Cardiovascular: S1 S2 auscultated, RRR. No pedal edema b/l  Respiratory: Clear to auscultation bilaterally, no wheezing, rales or rhonchi  Gastrointestinal: Obese, soft, nontender, nondistended, + bowel sounds  Ext: no pedal edema bilaterally  Neuro: no new FND  Musculoskeletal: No digital cyanosis, clubbing  Skin: No rashes  Psych: Normal affect and demeanor, alert and oriented x3     Data Reviewed:  I have personally reviewed following labs and imaging studies  Micro Results Recent Results (from the past 240 hour(s))  Blood culture (routine x 2)     Status: None (Preliminary result)   Collection Time: 09/17/18  4:36 PM  Result Value Ref Range Status   Specimen Description BLOOD RIGHT WRIST  Final   Special Requests   Final    BOTTLES DRAWN AEROBIC AND ANAEROBIC Blood Culture results may not be optimal due to an inadequate volume of blood received in culture bottles   Culture   Final    NO GROWTH 2 DAYS Performed at [facility], [place] [address] [place], [place] [address]    Report Status PENDING  Incomplete  Blood culture (routine x 2)     Status: None (Preliminary result)   Collection Time: 09/17/18  5:13 PM  Result Value Ref Range Status    Specimen Description BLOOD RIGHT FOREARM  Final   Special Requests   Final    BOTTLES DRAWN AEROBIC AND ANAEROBIC Blood Culture adequate volume   Culture   Final    NO GROWTH 2 DAYS Performed at [facility], [place] [address], [place], [place] [address]    Report Status PENDING  Incomplete  Surgical pcr screen     Status: None   Collection Time: 09/18/18  1:55 PM  Result Value Ref Range Status   MRSA, PCR NEGATIVE NEGATIVE Final   Staphylococcus aureus NEGATIVE NEGATIVE Final    Comment: (NOTE) The Xpert SA Assay (FDA approved for NASAL specimens in patients 27 years of age and older), is one component of a comprehensive surveillance program. It is not intended to diagnose infection nor to guide or monitor treatment. Performed at [facility], [place] [address], [place], [place] [address]     Radiology Reports Ir Fluoro Guide Cv Line Right  Result Date: 09/17/2018 CLINICAL DATA:  Occluded hemodialysis fistula. Patient presented for declot, but patient was found to be severely anemic with elevated WBC count. EXAM: EXAM RIGHT IJ CATHETER PLACEMENT UNDER ULTRASOUND AND FLUOROSCOPIC GUIDANCE TECHNIQUE: The procedure, risks (including but not limited to bleeding, infection, organ damage, pneumothorax), benefits, and alternatives were explained to the patient. Questions regarding the procedure were encouraged and answered. The patient understands and consents to the procedure.On a patency of the right IJ vein was confirmed with ultrasound with image documentation. An appropriate skin site was determined. Skin site was marked. Region was prepped using maximum barrier technique including cap and mask, sterile gown, sterile gloves, large sterile sheet, and Chlorhexidine as cutaneous antisepsis. The region was infiltrated locally with 1% lidocaine. Under real-time ultrasound guidance, the right IJ vein was accessed with a 19 gauge needle; the needle tip within the vein was confirmed with  ultrasound image documentation. The needle exchanged over a guidewire for vascular dilator which allowed advancement of a 20 cm Trialysis catheter. This was  positioned with the tip at the cavoatrial junction. Spot chest radiograph shows good positioning and no pneumothorax. Catheter was flushed and sutured externally with 0-Prolene sutures. Patient tolerated the procedure well. FLUOROSCOPY TIME:  1.1 minute; 562 uGym2 DAP COMPLICATIONS: COMPLICATIONS none IMPRESSION: 1. Technically successful right IJ Trialysis catheter placement. Electronically Signed   By: Lucrezia Europe M.D.   On: 09/17/2018 15:52   Ir US Guide Vasc Access Right  Result Date: 09/17/2018 CLINICAL DATA:  Occluded hemodialysis fistula. Patient presented for declot, but patient was found to be severely anemic with elevated WBC count. EXAM: EXAM RIGHT IJ CATHETER PLACEMENT UNDER ULTRASOUND AND FLUOROSCOPIC GUIDANCE TECHNIQUE: The procedure, risks (including but not limited to bleeding, infection, organ damage, pneumothorax), benefits, and alternatives were explained to the patient. Questions regarding the procedure were encouraged and answered. The patient understands and consents to the procedure.On a patency of the right IJ vein was confirmed with ultrasound with image documentation. An appropriate skin site was determined. Skin site was marked. Region was prepped using maximum barrier technique including cap and mask, sterile gown, sterile gloves, large sterile sheet, and Chlorhexidine as cutaneous antisepsis. The region was infiltrated locally with 1% lidocaine. Under real-time ultrasound guidance, the right IJ vein was accessed with a 19 gauge needle; the needle tip within the vein was confirmed with ultrasound image documentation. The needle exchanged over a guidewire for vascular dilator which allowed advancement of a 20 cm Trialysis catheter. This was positioned with the tip at the cavoatrial junction. Spot chest radiograph shows good  positioning and no pneumothorax. Catheter was flushed and sutured externally with 0-Prolene sutures. Patient tolerated the procedure well. FLUOROSCOPY TIME:  1.1 minute; 130 uGym2 DAP COMPLICATIONS: COMPLICATIONS none IMPRESSION: 1. Technically successful right IJ Trialysis catheter placement. Electronically Signed   By: Lucrezia Europe M.D.   On: 09/17/2018 15:52   Dg Chest Port 1 View  Result Date: 09/18/2018 CLINICAL DATA:  Right IJ dialysis catheter reposition EXAM: PORTABLE CHEST 1 VIEW COMPARISON:  09/18/2018 FINDINGS: Right IJ dialysis catheter has been retracted into the SVC RA junction level. Stable cardiomegaly with vascular congestion and basilar atelectasis. Right hemidiaphragm elevated. No large effusion or pneumothorax. Trachea is midline. Degenerative changes of the spine. Left cephalic arch vascular stents evident. IMPRESSION: Right IJ temporary dialysis catheter tip SVC RA junction. Stable cardiomegaly with vascular congestion and basilar atelectasis. Electronically Signed   By: Jerilynn Mages.  Shick M.D.   On: 09/18/2018 18:58   Dg Chest Port 1 View  Result Date: 09/18/2018 CLINICAL DATA:  Short of breath. Encounter for central line placement. EXAM: PORTABLE CHEST 1 VIEW COMPARISON:  09/17/2018 FINDINGS: Double-lumen right internal jugular central venous line is without change from the prior study. Tip projects in the right atrium. There is vascular congestion with mild right basilar atelectasis similar to the previous day's study. No new lung abnormalities. No pneumothorax. IMPRESSION: 1. No significant change from the previous day's study. 2. Right double-lumen center venous catheter tip projects in the right atrium. No pneumothorax. 3. Vascular congestion and right basilar atelectasis. Electronically Signed   By: Lajean Manes M.D.   On: 09/18/2018 14:33   Dg Chest Portable 1 View  Result Date: 09/17/2018 CLINICAL DATA:  Tachycardia, elevated WBC. Pt denies chest complaints at this time. Pt  currently wearing oxygen. EXAM: PORTABLE CHEST 1 VIEW COMPARISON:  02/02/2017 FINDINGS: RIGHT-sided IJ central line has been placed, tip probably at the level of the UPPER RIGHT atrium. No pneumothorax. The heart is enlarged. There is pulmonary  vascular congestion and RIGHT LOWER lobe atelectasis. IMPRESSION: Interval placement of RIGHT IJ central line, tip probably to level of the UPPER RIGHT atrium. Electronically Signed   By: Nolon Nations M.D.   On: 09/17/2018 16:52    Lab Data:  CBC: Recent Labs  Lab 09/14/18 0637 09/17/18 1206 09/18/18 0429 09/19/18 0425  WBC 16.5* 25.7* 18.3* 16.7*  NEUTROABS 13.7*  --   --   --   HGB 8.7* 4.9* 9.7* 10.4*  HCT 31.7* 17.7* 33.7* 36.1  MCV 101.6* 101.1* 95.7 95.5  PLT 266 419* 332 124   Basic Metabolic Panel: Recent Labs  Lab 09/14/18 0637 09/17/18 1206 09/18/18 0233 09/18/18 0429 09/19/18 0425  NA 133* 136  --  136 132*  K 3.8 4.8  --  5.4* 4.4  CL 96* 100  --  98 96*  CO2 26 23  --  21* 22  GLUCOSE 176* 113*  --  109* 155*  BUN 37* 91*  --  95* 41*  CREATININE 5.80* 12.30*  --  13.36* 7.83*  CALCIUM 8.9 9.0  --  8.9 8.4*  PHOS  --   --  8.6*  --  5.3*   GFR: Estimated Creatinine Clearance: 12.4 mL/min (A) (by C-G formula based on SCr of 7.83 mg/dL (H)). Liver Function Tests: Recent Labs  Lab 09/18/18 0429 09/19/18 0425  AST 13*  --   ALT 11  --   ALKPHOS 85  --   BILITOT 1.2  --   PROT 8.5*  --   ALBUMIN 2.8* 2.9*   No results for input(s): LIPASE, AMYLASE in the last 168 hours. No results for input(s): AMMONIA in the last 168 hours. Coagulation Profile: Recent Labs  Lab 09/17/18 1206  INR 1.30   Cardiac Enzymes: No results for input(s): CKTOTAL, CKMB, CKMBINDEX, TROPONINI in the last 168 hours. BNP (last 3 results) No results for input(s): PROBNP in the last 8760 hours. HbA1C: No results for input(s): HGBA1C in the last 72 hours. CBG: Recent Labs  Lab 09/18/18 0816 09/18/18 1324 09/18/18 1634  09/19/18 0756 09/19/18 1156  GLUCAP 109* 150* 161* 96 166*   Lipid Profile: No results for input(s): CHOL, HDL, LDLCALC, TRIG, CHOLHDL, LDLDIRECT in the last 72 hours. Thyroid Function Tests: No results for input(s): TSH, T4TOTAL, FREET4, T3FREE, THYROIDAB in the last 72 hours. Anemia Panel: No results for input(s): VITAMINB12, FOLATE, FERRITIN, TIBC, IRON, RETICCTPCT in the last 72 hours. Urine analysis:    Component Value Date/Time   COLORURINE YELLOW 07/18/2013 0831   APPEARANCEUR CLOUDY (A) 07/18/2013 0831   LABSPEC 1.012 07/18/2013 0831   PHURINE 5.0 07/18/2013 0831   GLUCOSEU NEGATIVE 07/18/2013 0831   HGBUR SMALL (A) 07/18/2013 0831   BILIRUBINUR NEGATIVE 07/18/2013 0831   KETONESUR NEGATIVE 07/18/2013 0831   PROTEINUR 30 (A) 07/18/2013 0831   UROBILINOGEN 0.2 07/18/2013 0831   NITRITE NEGATIVE 07/18/2013 0831   LEUKOCYTESUR MODERATE (A) 07/18/2013 0831     Harel Repetto M.D. [facility] 09/19/2018, 2:14 PM  Pager: [phone] Between 7am to 7pm - call Pager - [phone]  After 7pm go to [email or web address] - password [email or web address]  Call night coverage person covering after 7pm

## 2018-09-19 NOTE — Progress Notes (Signed)
Attempted to call report  0100 but primary nurse unavailable, was able to give report at  [place]  HD tx completed @ 0045 w/bp issues intermittently where pt was symptomatic UF goal still met d/t goal being a range Blood rinsed back VSS Report called to Carson Myrtle, RN

## 2018-09-19 NOTE — Progress Notes (Signed)
Pt's Hr went up to the 140's sustaining for 30 min. Pt asymptomatic. PRN Cardizem given. Pt's Hr dropped into the 90's SR. Will cont to monitor pt.

## 2018-09-20 ENCOUNTER — Inpatient Hospital Stay (HOSPITAL_COMMUNITY): Payer: Medicare Other

## 2018-09-20 ENCOUNTER — Encounter (HOSPITAL_COMMUNITY): Payer: Self-pay | Admitting: Interventional Radiology

## 2018-09-20 HISTORY — PX: IR FLUORO GUIDE CV LINE RIGHT: IMG2283

## 2018-09-20 LAB — GLUCOSE, CAPILLARY
Glucose-Capillary: 87 mg/dL (ref 70–99)
Glucose-Capillary: 112 mg/dL — ABNORMAL HIGH (ref 70–99)

## 2018-09-20 LAB — RENAL FUNCTION PANEL
Albumin: 2.6 g/dL — ABNORMAL LOW (ref 3.5–5.0)
Anion gap: 12 (ref 5–15)
BUN: 52 mg/dL — ABNORMAL HIGH (ref 6–20)
CO2: 25 mmol/L (ref 22–32)
Calcium: 9 mg/dL (ref 8.9–10.3)
Chloride: 96 mmol/L — ABNORMAL LOW (ref 98–111)
Creatinine, Ser: 10.17 mg/dL — ABNORMAL HIGH (ref 0.44–1.00)
GFR calc Af Amer: 4 mL/min — ABNORMAL LOW (ref >60)
GFR calc non Af Amer: 4 mL/min — ABNORMAL LOW (ref >60)
Glucose, Bld: 89 mg/dL (ref 70–99)
Phosphorus: 7.2 mg/dL — ABNORMAL HIGH (ref 2.5–4.6)
Potassium: 4.7 mmol/L (ref 3.5–5.1)
Sodium: 133 mmol/L — ABNORMAL LOW (ref 135–145)

## 2018-09-20 LAB — CBC
HCT: 36 % (ref 36.0–46.0)
Hemoglobin: 10.1 g/dL — ABNORMAL LOW (ref 12.0–15.0)
MCH: 27.1 pg (ref 26.0–34.0)
MCHC: 28.1 g/dL — ABNORMAL LOW (ref 30.0–36.0)
MCV: 96.5 fL (ref 80.0–100.0)
Platelets: 338 K/uL (ref 150–400)
RBC: 3.73 MIL/uL — ABNORMAL LOW (ref 3.87–5.11)
RDW: 16.8 % — ABNORMAL HIGH (ref 11.5–15.5)
WBC: 15.9 10*3/uL — ABNORMAL HIGH (ref 4.0–10.5)
nRBC: 0 % (ref 0.0–0.2)

## 2018-09-20 MED ORDER — CHLORHEXIDINE GLUCONATE 4 % EX LIQD
CUTANEOUS | Status: AC
  Filled 2018-09-20: qty 15

## 2018-09-20 MED ORDER — CALCITRIOL 0.5 MCG PO CAPS
ORAL_CAPSULE | ORAL | Status: AC
  Administered 2018-09-20: 3.75 ug via ORAL
  Filled 2018-09-20: qty 3

## 2018-09-20 MED ORDER — VANCOMYCIN HCL IN DEXTROSE 1-5 GM/200ML-% IV SOLN
INTRAVENOUS | Status: AC
  Administered 2018-09-20: 1000 mg
  Filled 2018-09-20: qty 200

## 2018-09-20 MED ORDER — HEPARIN SODIUM (PORCINE) 1000 UNIT/ML DIALYSIS
5000.0000 [IU] | Freq: Once | INTRAMUSCULAR | Status: AC
  Administered 2018-09-20: 5000 [IU] via INTRAVENOUS_CENTRAL
  Filled 2018-09-20: qty 5

## 2018-09-20 MED ORDER — LIDOCAINE HCL (PF) 1 % IJ SOLN: 5.0000 mL | INTRAMUSCULAR | Status: DC | PRN

## 2018-09-20 MED ORDER — HEPARIN SODIUM (PORCINE) 1000 UNIT/ML IJ SOLN
INTRAMUSCULAR | Status: AC
  Administered 2018-09-20: 5000 [IU] via INTRAVENOUS_CENTRAL
  Filled 2018-09-20: qty 5

## 2018-09-20 MED ORDER — SODIUM CHLORIDE 0.9 % IV SOLN: 100.0000 mL | INTRAVENOUS | Status: DC | PRN

## 2018-09-20 MED ORDER — CALCITRIOL 0.25 MCG PO CAPS
ORAL_CAPSULE | ORAL | Status: AC
  Filled 2018-09-20: qty 6

## 2018-09-20 MED ORDER — LIDOCAINE-EPINEPHRINE (PF) 1 %-1:200000 IJ SOLN
INTRAMUSCULAR | Status: AC | PRN
  Administered 2018-09-20: 10 mL

## 2018-09-20 MED ORDER — HEPARIN SODIUM (PORCINE) 1000 UNIT/ML IJ SOLN
INTRAMUSCULAR | Status: AC
  Filled 2018-09-20: qty 4

## 2018-09-20 MED ORDER — HEPARIN SODIUM (PORCINE) 1000 UNIT/ML DIALYSIS
1000.0000 [IU] | INTRAMUSCULAR | Status: DC | PRN
  Filled 2018-09-20: qty 1

## 2018-09-20 MED ORDER — ALTEPLASE 2 MG IJ SOLR: 2.0000 mg | Freq: Once | INTRAMUSCULAR | Status: DC | PRN

## 2018-09-20 MED ORDER — DOXYCYCLINE HYCLATE 100 MG PO TABS: 100.0000 mg | ORAL_TABLET | Freq: Two times a day (BID) | ORAL | 0 refills | Status: DC

## 2018-09-20 MED ORDER — MIDAZOLAM HCL 2 MG/2ML IJ SOLN
INTRAMUSCULAR | Status: AC | PRN
  Administered 2018-09-20: 1 mg via INTRAVENOUS
  Administered 2018-09-20: 0.5 mg via INTRAVENOUS

## 2018-09-20 MED ORDER — FENTANYL CITRATE (PF) 100 MCG/2ML IJ SOLN
INTRAMUSCULAR | Status: AC
  Filled 2018-09-20: qty 2

## 2018-09-20 MED ORDER — MIDAZOLAM HCL 2 MG/2ML IJ SOLN
INTRAMUSCULAR | Status: AC
  Filled 2018-09-20: qty 2

## 2018-09-20 MED ORDER — FENTANYL CITRATE (PF) 100 MCG/2ML IJ SOLN
INTRAMUSCULAR | Status: AC | PRN
  Administered 2018-09-20 (×2): 50 ug via INTRAVENOUS

## 2018-09-20 MED ORDER — PENTAFLUOROPROP-TETRAFLUOROETH EX AERO: 1.0000 "application " | INHALATION_SPRAY | CUTANEOUS | Status: DC | PRN

## 2018-09-20 MED ORDER — HEPARIN SODIUM (PORCINE) 1000 UNIT/ML IJ SOLN
INTRAMUSCULAR | Status: AC
  Filled 2018-09-20: qty 1

## 2018-09-20 MED ORDER — LIDOCAINE-PRILOCAINE 2.5-2.5 % EX CREA
1.0000 "application " | TOPICAL_CREAM | CUTANEOUS | Status: DC | PRN
  Filled 2018-09-20: qty 5

## 2018-09-20 MED ORDER — LIDOCAINE-EPINEPHRINE (PF) 1 %-1:200000 IJ SOLN
INTRAMUSCULAR | Status: AC
  Filled 2018-09-20: qty 30

## 2018-09-20 MED ORDER — CEFAZOLIN SODIUM-DEXTROSE 2-4 GM/100ML-% IV SOLN
INTRAVENOUS | Status: AC
  Filled 2018-09-20: qty 100

## 2018-09-20 NOTE — Sedation Documentation (Signed)
Patient mouth breathing. ETCO2 reading erroneous at times.

## 2018-09-20 NOTE — Consult Note (Signed)
            [facility] CM Primary Care Navigator  09/20/2018  [name] October 18, 1961 [id number]   Went to see patient at the bedside to identify possible discharge needs but family member states "not a good time since patient is getting ready to get in the shower".  Per chart review, patient sent over for declotting of dialysis fistula but was found to have hemoglobin of 4.9 and leukocytosis. She was admitted with symptomatic anemia.  Will attempt to see patient at another time when she is available in the room.    Addendum: (09/23/18):  Went back to see patient at the bedside to identify possible discharge needs but she was already discharged home towards the weekend.  Per MD note, patient presented for further work-up of clotted left upper extremity fistula where she had significant bleeding (hemoglobin dropped to 4.9) prior to admission and was tachycardic and weak. (symptomatic anemia likely due to left upper extremity bleeding from the fistula, ESRD-end stage renal disease on hemodialysis with access issues)   Patient has discharge instruction to follow-up with primary care provider in 1- 2 weeks.  Primary care provider's office is listed as providing transition of care (TOC) follow-up.   For additional questions please contact:  Edwena Felty A. Ashlley Booher, BSN, RN-BC [facility] PRIMARY CARE Navigator Cell: [phone]

## 2018-09-20 NOTE — Procedures (Signed)
I was present at this dialysis session. I have reviewed the session itself and made appropriate changes.   Vital signs in last 24 hours:  Temp:  [97.7 F (36.5 C)-98.4 F (36.9 C)] 98 F (36.7 C) (10/25 0725) Pulse Rate:  [71-95] 85 (10/25 0908) Resp:  [18-28] 20 (10/25 0908) BP: (83-148)/(57-91) 141/66 (10/25 0908) SpO2:  [95 %-100 %] 95 % (10/25 0900) Weight:  [155.3 kg] 155.3 kg (10/25 0536) Weight change: -4.588 kg Filed Weights   09/18/18 2037 09/19/18 0052 09/20/18 0536  Weight: (!) 158 kg (!) 155 kg (!) 155.3 kg    Recent Labs  Lab 09/20/18 0703  NA 133*  K 4.7  CL 96*  CO2 25  GLUCOSE 89  BUN 52*  CREATININE 10.17*  CALCIUM 9.0  PHOS 7.2*    Recent Labs  Lab 09/14/18 0637  09/18/18 0429 09/19/18 0425 09/20/18 0703  WBC 16.5*   < > 18.3* 16.7* 15.9*  NEUTROABS 13.7*  --   --   --   --   HGB 8.7*   < > 9.7* 10.4* 10.1*  HCT 31.7*   < > 33.7* 36.1 36.0  MCV 101.6*   < > 95.7 95.5 96.5  PLT 266   < > 332 304 338   < > = values in this interval not displayed.    Scheduled Meds: . acidophilus  1 capsule Oral BID  . aspirin EC  81 mg Oral QHS  . calcitRIOL  3.5 mcg Oral Q M,W,F-HD  . calcium acetate  667 mg Oral TID WC  . Chlorhexidine Gluconate Cloth  6 each Topical Q0600  . Chlorhexidine Gluconate Cloth  6 each Topical Q0600  . cinacalcet  120 mg Oral Q M,W,F-HD  . ciprofloxacin  500 mg Oral Daily  . darbepoetin (ARANESP) injection - DIALYSIS  100 mcg Intravenous Q Wed-HD  . doxycycline  100 mg Oral Q12H  . DULoxetine  60 mg Oral Daily  . famotidine  20 mg Oral QHS  . feeding supplement  1 Container Oral BID BM  . insulin aspart  0-5 Units Subcutaneous QHS  . insulin aspart  0-9 Units Subcutaneous TID WC  . levothyroxine  75 mcg Oral QAC breakfast  . midodrine  10 mg Oral Q M,W,F  . multivitamin  1 tablet Oral QHS  . pravastatin  40 mg Oral QHS  . sevelamer carbonate  2,400 mg Oral TID WC  . sodium chloride flush  3 mL Intravenous Q12H  . sodium  hypochlorite  1 application Topical TID  . sucroferric oxyhydroxide  1,000 mg Oral TID WC   Continuous Infusions: . sodium chloride    . sodium chloride    . sodium chloride     PRN Meds:.sodium chloride, sodium chloride, sodium chloride, acetaminophen, alteplase, calcium acetate, diltiazem, diphenhydrAMINE, heparin, heparin, lidocaine (PF), lidocaine-prilocaine, ondansetron **OR** ondansetron (ZOFRAN) IV, oxyCODONE-acetaminophen, pentafluoroprop-tetrafluoroeth, sodium chloride flush    Dialysis Orders:MWF [facility] 4.5 hr 200NRe 500/800 154.5 kg 2.0K/2.25 Ca  -Heparin 5000 units IV initial bolus, Heparin 5000 units IV mid run -Mircera 225 mcg IV q 2 weeks (last dose 09/04/2018 Last HGB 8.8 09/11/2018)  Assessment/Plan: 1. Clotted AVF-Awaiting declot of AVF. Again ate and again procedure has been rescheduled. Discussed with IR-will place [facility] and attempt declot 09/20/18. T-Cath adjusted per IR-able to use to HD last PM.  2. ABLA-bleeding from AVF (09/14/2018) HGB 4.9. Rec'd 3 units PRBCs. HGB 10.4  3. ESRD - MWF-HD last PM via T-Cath. Next HD 10/25.  4. Hypertension/volume - HD last PM Pre wt158 kg Net UF 3039 Post wt 155 kg. Close to OP EDW. 1.5-2 liters with HD today.  5. Anemia - See above. Rec'd Aranesp 100 mcg IV 09/19/18.  6. Metabolic bone disease - Continue binders, VDRA 7. Nutrition - Renal/Carb mod diet. NPO at MN.  8. Chronic hidrenitis suppuritiva-ongoing issue.  9.  H/O Afib. Episode of SVT terminated when HD stopped. Now SR. Not on anticoagulation D/T chronically low HGB.  improved after HD cath was retracted some.  Did have another episode last pm. 10. DM-stop oral glipizide while in hospital. Use S/S insulin only.  11. Disposition- pending TDC placment and attempt at declot.   Donetta Potts,  MD 09/20/2018, 9:24 AM

## 2018-09-20 NOTE — Discharge Summary (Signed)
Physician Discharge Summary   Patient ID: [name] MRN: [id number] DOB/AGE: 02-01-1961 57 y.o.  Admit date: 09/17/2018 Discharge date: 09/20/2018  Primary Care Physician:  Harlan Stains, MD   Recommendations for Outpatient Follow-up:  1. Follow up with PCP in 1-2 weeks   Home Health: none Equipment/Devices:   Discharge Condition: stable CODE STATUS: FULL  Diet recommendation: Carb modified diet   Discharge Diagnoses:    . Symptomatic anemia . Morbid obesity ([place]) . GERD . ESRD on hemodialysis with access issues Transient SVT Hiradenitis on the groin and buttocks Chronic diastolic CHF   Consults:   Vascular surgery Nephrology Interventional radiology    Allergies:   Allergies  Allergen Reactions  . Rosiglitazone Other (See Comments)    "Avandia" = Reaction not recalled  . Amoxicillin Rash and Other (See Comments)    Tolerated Zosyn 01/2013. [place] Has patient had a PCN reaction causing immediate rash, facial/tongue/throat swelling, SOB or lightheadedness with hypotension: Yes Has patient had a PCN reaction causing severe rash involving mucus membranes or skin necrosis: No Has patient had a PCN reaction that required hospitalization: No Has patient had a PCN reaction occurring within the last 10 years: No If all of the above answers are "NO", then may proceed with Cephalosporin use.   Marland Kitchen Amoxicillin-Pot Clavulanate Diarrhea     DISCHARGE MEDICATIONS: Allergies as of 09/20/2018      Reactions   Rosiglitazone Other (See Comments)   "Avandia" = Reaction not recalled   Amoxicillin Rash, Other (See Comments)   Tolerated Zosyn 01/2013. [place] Has patient had a PCN reaction causing immediate rash, facial/tongue/throat swelling, SOB or lightheadedness with hypotension: Yes Has patient had a PCN reaction causing severe rash involving mucus membranes or skin necrosis: No Has patient had a PCN reaction that required hospitalization: No Has patient  had a PCN reaction occurring within the last 10 years: No If all of the above answers are "NO", then may proceed with Cephalosporin use.   Amoxicillin-pot Clavulanate Diarrhea      Medication List    TAKE these medications   acetaminophen 500 MG tablet Commonly known as:  TYLENOL Take 1,000 mg by mouth daily as needed for moderate pain.   aspirin EC 81 MG tablet Take 81 mg by mouth at bedtime.   Calcium Acetate 667 MG Tabs Take 667 mg by mouth See admin instructions. Take 667 mg by mouth three times a day with meals and 667 mg with each snack   ciprofloxacin 500 MG tablet Commonly known as:  CIPRO Take 1 tablet (500 mg total) by mouth daily.   diltiazem 30 MG tablet Commonly known as:  CARDIZEM Take 1 tablet every 4 hours AS NEEDED for rapid heart rate lasting more than 30 mins as long as top blood pressure >100. What changed:    how much to take  how to take this  when to take this  reasons to take this  additional instructions   diphenhydrAMINE 25 MG tablet Commonly known as:  BENADRYL Take 25 mg by mouth daily as needed for allergies.   doxycycline 100 MG tablet Commonly known as:  VIBRA-TABS Take 1 tablet (100 mg total) by mouth 2 (two) times daily.   DULoxetine 60 MG capsule Commonly known as:  CYMBALTA Take 1 capsule (60 mg total) by mouth daily.   famotidine 20 MG tablet Commonly known as:  PEPCID TAKE 1 TABLET BY MOUTH AT BEDTIME What changed:    how much to take  how  to take this  when to take this   feeding supplement Liqd Take 1 Container by mouth 2 (two) times daily between meals.   glipiZIDE 5 MG tablet Commonly known as:  GLUCOTROL Take 0.5 tablets (2.5 mg total) by mouth daily.   levothyroxine 75 MCG tablet Commonly known as:  SYNTHROID, LEVOTHROID Take 75 mcg by mouth daily.   midodrine 10 MG tablet Commonly known as:  PROAMATINE Take 1 tablet (10 mg total) by mouth 3 (three) times daily. What changed:  when to take this    multivitamin Tabs tablet Take 1 tablet by mouth daily.   oxyCODONE-acetaminophen 5-325 MG tablet Commonly known as:  PERCOCET/ROXICET Take 1-2 tablets by mouth every 6 (six) hours as needed. Patient has not taken since started dialysis 08/2013 due to causes hypotension What changed:    how much to take  reasons to take this  additional instructions   oxymetazoline 0.05 % nasal spray Commonly known as:  AFRIN Place 1 spray into both nostrils once as needed for congestion.   pravastatin 40 MG tablet Commonly known as:  PRAVACHOL Take 40 mg by mouth at bedtime.   PROBIOTIC DAILY PO Take 1 capsule by mouth 2 (two) times daily.   sevelamer carbonate 800 MG tablet Commonly known as:  RENVELA Take 2,400 mg by mouth 3 (three) times daily with meals.   sodium hypochlorite 0.25 % Soln Apply 1 application topically See admin instructions. Apply as directed to groin and buttocks 3 times a day   VELPHORO 500 MG chewable tablet Generic drug:  sucroferric oxyhydroxide Chew 1,000 mg by mouth 3 (three) times daily with meals.        Brief H and P: For complete details please refer to admission H and P, but in brief, Patient is a 57 year old female with ESRD on hemodialysis MWF, diabetes, morbid obesity, depression, hypertension, hyperkalemia presented to ED for anemia.  He apparently patient had significant bleeding from her left upper extremity fistula 4 days prior to admission.  Hemoglobin dropped to 4.9.  Patient attempted to have hemodialysis on Monday 10/21 however the left upper extremity fistula clotted.  She was sent over for declotting procedure and was not deemed unsafe for the procedure with hemoglobin of 4.9.  Patient was sent to the ED.  Temporary IJ cath was placed.  Patient was tachycardiac and Thursday and like weakness.  She was admitted for further work-up.   Hospital Course:  Symptomatic anemia likely due to left upper extremity bleeding from the fistula prior to  admission -Status post transfusion 3 units of packed RBCs, hemoglobin 4.9 at the time of admission -FOBT negative. -H&H stable 10.1    ESRD on hemodialysis with access issues -Currently having access issues with left upper extremity fistula clotted and nonfunctioning temporary TDC -Highly appreciate IR for fixing the existing right IJ [facility], patient underwent hemodialysis on 10/23 night, and today 10/25 -Interventional radiology was consulted, patient had successful fluoroscopy guided conversion of temporary HD cath to a tunneled HD cath.  IR  discussed with the patient regarding potential intervention on her thrombosed left upper arm fistula, patient declined at this time for the declotting procedure hence fistulalysis was not attempted   SVT today, transient, history of paroxysmal atrial fibrillation -Followed in [facility]. fib clinic, by Dr. Golden Hurter, cardiology -Remained stable, no further episodes. -Patient has been considered not to be a anticoagulation candidate due to open skin lesions secondary to hidradenitis and causes constant bleeding and oozing. Per cardiology, patient cannot  be on long-acting CCB or beta-blocker due to issues with hypotension during hemodialysis -Currently stable, no further repeat episodes  History of hiradenitis on the groin and buttocks -Wounds on the buttocks examined, per patient she has required IV antibiotics in the past.   - Presented with white count of 25.7, improving, 15.9, continue doxycycline,  patient has been taking it outpatient for the flares  History of orthostatic hypotension -Patient has a history of orthostatic hypotension during hemodialysis and has been controlled with midodrine 10 mg every MWF with dialysis.  Chronic diastolic CHF -Currently has some volume overload, needs HD  Morbid obesity -BMI 55 -Patient counseled on diet and weight control   Day of Discharge S: Wants to go home as IR procedure is done, cleared by  nephrology  BP 129/89   Pulse (!) 101   Temp 98.5 F (36.9 C) (Oral)   Resp 19   Ht 5\' 6"  (1.676 m)   Wt (!) 155.3 kg   LMP 06/13/2013   SpO2 99%   BMI 55.26 kg/m   Physical Exam: General: Alert and awake oriented x3 not in any acute distress. HEENT: anicteric sclera, pupils reactive to light and accommodation CVS: S1-S2 clear no murmur rubs or gallops Chest: clear to auscultation bilaterally, no wheezing rales or rhonchi Abdomen: soft nontender, nondistended, normal bowel sounds Extremities: no cyanosis, clubbing or edema noted bilaterally Neuro: Cranial nerves II-XII intact, no focal neurological deficits   The results of significant diagnostics from this hospitalization (including imaging, microbiology, ancillary and laboratory) are listed below for reference.      Procedures/Studies:  Ir Fluoro Guide Cv Line Right  Result Date: 09/20/2018 INDICATION: History of end-stage renal disease, currently receiving dialysis via temporary right internal jugular approach dialysis catheter. Patient with left upper extremity AV fistula which was last intervened upon on both 08/14/2018 and 08/13/2018. Unfortunately, the patient's left upper arm dialysis fistula has been clotted for the past week. Request made for conversion of a temporary dialysis catheter to a tunneled dialysis catheter. If able, request made for attempted declot of left upper arm fistula. Note, patient is unable to lie flat and has tolerated previous dialysis interventions (including the temporary dialysis catheter placement) very poorly. EXAM: FLUOROSCOPIC GUIDED CONVERSION OF NON TUNNELED TO TUNNELED CENTRAL VENOUS HEMODIALYSIS CATHETER COMPARISON:  Temporary dialysis catheter placement-09/17/2018; left upper extremity shuntogram with intervention-08/14/2018; 08/13/2018 MEDICATIONS: None ANESTHESIA/SEDATION: Moderate (conscious) sedation was employed during this procedure. A total of Versed 1 mg and Fentanyl 100 mcg was  administered intravenously. Moderate Sedation Time: 14 minutes. The patient's level of consciousness and vital signs were monitored continuously by radiology nursing throughout the procedure under my direct supervision. FLUOROSCOPY TIME:  18 seconds (4 mGy) COMPLICATIONS: None immediate. PROCEDURE: Informed written consent was obtained from the patient after a discussion of the risks, benefits, and alternatives to treatment. Questions regarding the procedure were encouraged and answered. The skin and external portion of the existing hemodialysis catheter was prepped with chlorhexidine in a sterile fashion, and a sterile drape was applied covering the operative field. Maximum barrier sterile technique with sterile gowns and gloves were used for the procedure. A timeout was performed prior to the initiation of the procedure. A lumen of the none tunneled temporary dialysis catheter was cannulated with a stiff Glidewire in utilized for measurement purposes. Next, the Glidewire was advanced to the level of the IVC. A 19 cm tip to cuff palindrome dialysis catheter was tunneled from a site along the anterior chest to the venotomy  site. Under intermittent fluoroscopic guidance, the temporary dialysis catheter was exchanged for a peel-away sheath. The tunneled dialysis catheter was inserted into the peel-away sheath with tips open terminating within in the superior aspect the right atrium. Final catheter positioning was confirmed and documented with a spot fluoroscopic image. The catheter aspirates and flushes normally. The catheter was flushed with appropriate volume heparin dwells. The catheter exit site was secured with a 0-Prolene retention sutures. The venotomy site was apposed with derma bond and Steri-Strips. Both lumens were heparinized. A dressing was placed. The patient tolerated the procedure well without immediate post procedural complication. Prolonged conversations were held patient regarding potential  intervention on thrombosed left upper arm fistula however following this prolonged detailed conversation, the patient does not feel she would be able to tolerate attempted declot procedure. IMPRESSION: Successful fluoroscopic guided conversion of a temporary to a permanent 19 cm tip to cuff tunneled hemodialysis catheter with tips terminating within the right atrium. The catheter is ready for immediate use. Electronically Signed   By: Sandi Mariscal M.D.   On: 09/20/2018 15:13   Ir Fluoro Guide Cv Line Right  Result Date: 09/17/2018 CLINICAL DATA:  Occluded hemodialysis fistula. Patient presented for declot, but patient was found to be severely anemic with elevated WBC count. EXAM: EXAM RIGHT IJ CATHETER PLACEMENT UNDER ULTRASOUND AND FLUOROSCOPIC GUIDANCE TECHNIQUE: The procedure, risks (including but not limited to bleeding, infection, organ damage, pneumothorax), benefits, and alternatives were explained to the patient. Questions regarding the procedure were encouraged and answered. The patient understands and consents to the procedure.On a patency of the right IJ vein was confirmed with ultrasound with image documentation. An appropriate skin site was determined. Skin site was marked. Region was prepped using maximum barrier technique including cap and mask, sterile gown, sterile gloves, large sterile sheet, and Chlorhexidine as cutaneous antisepsis. The region was infiltrated locally with 1% lidocaine. Under real-time ultrasound guidance, the right IJ vein was accessed with a 19 gauge needle; the needle tip within the vein was confirmed with ultrasound image documentation. The needle exchanged over a guidewire for vascular dilator which allowed advancement of a 20 cm Trialysis catheter. This was positioned with the tip at the cavoatrial junction. Spot chest radiograph shows good positioning and no pneumothorax. Catheter was flushed and sutured externally with 0-Prolene sutures. Patient tolerated the procedure  well. FLUOROSCOPY TIME:  1.1 minute; 397 uGym2 DAP COMPLICATIONS: COMPLICATIONS none IMPRESSION: 1. Technically successful right IJ Trialysis catheter placement. Electronically Signed   By: Lucrezia Europe M.D.   On: 09/17/2018 15:52   Ir US Guide Vasc Access Right  Result Date: 09/17/2018 CLINICAL DATA:  Occluded hemodialysis fistula. Patient presented for declot, but patient was found to be severely anemic with elevated WBC count. EXAM: EXAM RIGHT IJ CATHETER PLACEMENT UNDER ULTRASOUND AND FLUOROSCOPIC GUIDANCE TECHNIQUE: The procedure, risks (including but not limited to bleeding, infection, organ damage, pneumothorax), benefits, and alternatives were explained to the patient. Questions regarding the procedure were encouraged and answered. The patient understands and consents to the procedure.On a patency of the right IJ vein was confirmed with ultrasound with image documentation. An appropriate skin site was determined. Skin site was marked. Region was prepped using maximum barrier technique including cap and mask, sterile gown, sterile gloves, large sterile sheet, and Chlorhexidine as cutaneous antisepsis. The region was infiltrated locally with 1% lidocaine. Under real-time ultrasound guidance, the right IJ vein was accessed with a 19 gauge needle; the needle tip within the vein was confirmed with ultrasound  image documentation. The needle exchanged over a guidewire for vascular dilator which allowed advancement of a 20 cm Trialysis catheter. This was positioned with the tip at the cavoatrial junction. Spot chest radiograph shows good positioning and no pneumothorax. Catheter was flushed and sutured externally with 0-Prolene sutures. Patient tolerated the procedure well. FLUOROSCOPY TIME:  1.1 minute; 280 uGym2 DAP COMPLICATIONS: COMPLICATIONS none IMPRESSION: 1. Technically successful right IJ Trialysis catheter placement. Electronically Signed   By: Lucrezia Europe M.D.   On: 09/17/2018 15:52   Dg Chest Port 1  View  Result Date: 09/18/2018 CLINICAL DATA:  Right IJ dialysis catheter reposition EXAM: PORTABLE CHEST 1 VIEW COMPARISON:  09/18/2018 FINDINGS: Right IJ dialysis catheter has been retracted into the SVC RA junction level. Stable cardiomegaly with vascular congestion and basilar atelectasis. Right hemidiaphragm elevated. No large effusion or pneumothorax. Trachea is midline. Degenerative changes of the spine. Left cephalic arch vascular stents evident. IMPRESSION: Right IJ temporary dialysis catheter tip SVC RA junction. Stable cardiomegaly with vascular congestion and basilar atelectasis. Electronically Signed   By: Jerilynn Mages.  Shick M.D.   On: 09/18/2018 18:58   Dg Chest Port 1 View  Result Date: 09/18/2018 CLINICAL DATA:  Short of breath. Encounter for central line placement. EXAM: PORTABLE CHEST 1 VIEW COMPARISON:  09/17/2018 FINDINGS: Double-lumen right internal jugular central venous line is without change from the prior study. Tip projects in the right atrium. There is vascular congestion with mild right basilar atelectasis similar to the previous day's study. No new lung abnormalities. No pneumothorax. IMPRESSION: 1. No significant change from the previous day's study. 2. Right double-lumen center venous catheter tip projects in the right atrium. No pneumothorax. 3. Vascular congestion and right basilar atelectasis. Electronically Signed   By: Lajean Manes M.D.   On: 09/18/2018 14:33   Dg Chest Portable 1 View  Result Date: 09/17/2018 CLINICAL DATA:  Tachycardia, elevated WBC. Pt denies chest complaints at this time. Pt currently wearing oxygen. EXAM: PORTABLE CHEST 1 VIEW COMPARISON:  02/02/2017 FINDINGS: RIGHT-sided IJ central line has been placed, tip probably at the level of the UPPER RIGHT atrium. No pneumothorax. The heart is enlarged. There is pulmonary vascular congestion and RIGHT LOWER lobe atelectasis. IMPRESSION: Interval placement of RIGHT IJ central line, tip probably to level of the  UPPER RIGHT atrium. Electronically Signed   By: Nolon Nations M.D.   On: 09/17/2018 16:52       LAB RESULTS: Basic Metabolic Panel: Recent Labs  Lab 09/19/18 0425 09/20/18 0703  NA 132* 133*  K 4.4 4.7  CL 96* 96*  CO2 22 25  GLUCOSE 155* 89  BUN 41* 52*  CREATININE 7.83* 10.17*  CALCIUM 8.4* 9.0  PHOS 5.3* 7.2*   Liver Function Tests: Recent Labs  Lab 09/18/18 0429 09/19/18 0425 09/20/18 0703  AST 13*  --   --   ALT 11  --   --   ALKPHOS 85  --   --   BILITOT 1.2  --   --   PROT 8.5*  --   --   ALBUMIN 2.8* 2.9* 2.6*   No results for input(s): LIPASE, AMYLASE in the last 168 hours. No results for input(s): AMMONIA in the last 168 hours. CBC: Recent Labs  Lab 09/14/18 0637  09/19/18 0425 09/20/18 0703  WBC 16.5*   < > 16.7* 15.9*  NEUTROABS 13.7*  --   --   --   HGB 8.7*   < > 10.4* 10.1*  HCT 31.7*   < >  36.1 36.0  MCV 101.6*   < > 95.5 96.5  PLT 266   < > 304 338   < > = values in this interval not displayed.   Cardiac Enzymes: No results for input(s): CKTOTAL, CKMB, CKMBINDEX, TROPONINI in the last 168 hours. BNP: Invalid input(s): POCBNP CBG: Recent Labs  Lab 09/20/18 1302 09/20/18 1534  GLUCAP 87 112*      Disposition and Follow-up: Discharge Instructions    Diet Carb Modified   Complete by:  As directed    Increase activity slowly   Complete by:  As directed        DISPOSITION: Home   DISCHARGE FOLLOW-UP Follow-up Information    Harlan Stains, MD. Schedule an appointment as soon as possible for a visit in 2 week(s).   Specialty:  Family Medicine Contact information: [address] [place] [address] [phone]            Time coordinating discharge:  40 minutes  Signed:   Estill Cotta M.D. [facility] 09/20/2018, 4:33 PM Pager: [phone]

## 2018-09-20 NOTE — Progress Notes (Signed)
[facility]                                                                              Patient Demographics  [name], is a 57 y.o. female, DOB - February 05, 1961, ZOX:[id number]  Admit date - 09/17/2018   Admitting Physician Elwyn Reach, MD  Outpatient Primary MD for the patient is Harlan Stains, MD  Outpatient specialists:   LOS - 3  days   Medical records reviewed and are as summarized below:    Chief Complaint  Patient presents with  . Anemia  . Needs Dialysis       Brief summary   Patient is a 57 year old female with ESRD on hemodialysis MWF, diabetes, morbid obesity, depression, hypertension, hyperkalemia presented to ED for anemia.  He apparently patient had significant bleeding from her left upper extremity fistula 4 days prior to admission.  Hemoglobin dropped to 4.9.  Patient attempted to have hemodialysis on Monday 10/21 however the left upper extremity fistula clotted.  She was sent over for declotting procedure and was not deemed unsafe for the procedure with hemoglobin of 4.9.  Patient was sent to the ED.  Temporary IJ cath was placed.  Patient was tachycardiac and Thursday and like weakness.  She was admitted for further work-up.   Assessment & Plan    Principal Problem:   Symptomatic anemia -Status post transfusion 3 units of packed RBCs, hemoglobin 4.9 at the time of admission -FOBT negative. -H&H stable 10.1  Active Problems:  ESRD on hemodialysis with access issues -Currently having access issues with left upper extremity fistula clotted and nonfunctioning temporary TDC -Highly appreciate IR for fixing the existing right IJ [facility], patient underwent hemodialysis on 10/23 night, today -Pending TDC placement and attempt at declot, plan for today   SVT today, transient, history of paroxysmal atrial fibrillation -Followed in A. fib clinic, by Dr. Golden Hurter, cardiology -Remained stable, no further episodes. -Patient has been  considered not to be a anticoagulation candidate due to open skin lesions secondary to hidradenitis and causes constant bleeding and oozing. Per cardiology, patient cannot be on long-acting CCB or beta-blocker due to issues with hypotension during hemodialysis -Currently stable, if repeat episodes, will consult cardiology  History of hiradenitis on the groin and buttocks -Wounds on the buttocks examined, per patient she has required IV antibiotics in the past.  - Presented with white count of 25.7, improving, 15.9, continue doxycycline  History of orthostatic hypotension -Patient has a history of orthostatic hypotension during hemodialysis and has been controlled with midodrine 10 mg every MWF with dialysis.  Chronic diastolic CHF -Currently has some volume overload, needs HD  Morbid obesity -Patient counseled on diet and weight control   Code Status: Full code DVT Prophylaxis:  SCD's Family Communication: Discussed in detail with the patient, all imaging results, lab results explained to the patient   Disposition Plan: To remain inpatient, high risk of deterioration due to volume overload, need of hemodialysis but having issues with access.  Continue monitoring in stepdown unit  Time Spent in minutes   25 minutes  Procedures:  Nephrology  Consultants:   Nephrology Vascular surgery  Interventional radiology  Antimicrobials:      Medications  Scheduled Meds: . acidophilus  1 capsule Oral BID  . aspirin EC  81 mg Oral QHS  . calcitRIOL      . calcitRIOL  3.5 mcg Oral Q M,W,F-HD  . calcium acetate  667 mg Oral TID WC  . Chlorhexidine Gluconate Cloth  6 each Topical Q0600  . Chlorhexidine Gluconate Cloth  6 each Topical Q0600  . cinacalcet  120 mg Oral Q M,W,F-HD  . ciprofloxacin  500 mg Oral Daily  . darbepoetin (ARANESP) injection - DIALYSIS  100 mcg Intravenous Q Wed-HD  . doxycycline  100 mg Oral Q12H  . DULoxetine  60 mg Oral Daily  . famotidine  20 mg Oral QHS    . feeding supplement  1 Container Oral BID BM  . heparin      . insulin aspart  0-5 Units Subcutaneous QHS  . insulin aspart  0-9 Units Subcutaneous TID WC  . levothyroxine  75 mcg Oral QAC breakfast  . midodrine  10 mg Oral Q M,W,F  . multivitamin  1 tablet Oral QHS  . pravastatin  40 mg Oral QHS  . sevelamer carbonate  2,400 mg Oral TID WC  . sodium chloride flush  3 mL Intravenous Q12H  . sodium hypochlorite  1 application Topical TID  . sucroferric oxyhydroxide  1,000 mg Oral TID WC   Continuous Infusions: . sodium chloride    . sodium chloride    . sodium chloride     PRN Meds:.sodium chloride, sodium chloride, sodium chloride, acetaminophen, alteplase, calcium acetate, diltiazem, diphenhydrAMINE, heparin, heparin, lidocaine (PF), lidocaine-prilocaine, ondansetron **OR** ondansetron (ZOFRAN) IV, oxyCODONE-acetaminophen, pentafluoroprop-tetrafluoroeth, sodium chloride flush   Antibiotics   Anti-infectives (From admission, onward)   Start     Dose/Rate Route Frequency Ordered Stop   09/18/18 1500  doxycycline (VIBRA-TABS) tablet 100 mg     100 mg Oral Every 12 hours 09/18/18 1438     09/18/18 1000  ciprofloxacin (CIPRO) tablet 500 mg     500 mg Oral Daily 09/17/18 2230          Subjective:   [name] was seen and examined today.  Seen during hemodialysis, complaining of dizziness.  No chest pain, shortness of breath, palpitations, nausea vomiting.    Objective:   Vitals:   09/20/18 1100 09/20/18 1130 09/20/18 1200 09/20/18 1215  BP: (!) 99/54 (!) 101/58 (!) 112/52 (!) 134/95  Pulse: 83 84 85 90  Resp: 20 (!) 21 (!) 21 (!) 21  Temp:      TempSrc:   Oral Oral  SpO2: 98% 97% 98% 98%  Weight:      Height:        Intake/Output Summary (Last 24 hours) at 09/20/2018 1232 Last data filed at 09/20/2018 1215 Gross per 24 hour  Intake 320 ml  Output 879 ml  Net -559 ml     Wt Readings from Last 3 Encounters:  09/20/18 (!) 155.3 kg  08/14/18 (!) 158.8 kg   06/27/18 136.1 kg     Exam    General: Alert and oriented x 3, NAD  Eyes:  HEENT:    Cardiovascular: S1 S2 auscultated, RRR. No pedal edema b/l  Respiratory: Clear to auscultation bilaterally, no wheezing, rales or rhonchi  Gastrointestinal: Soft, nontender, nondistended, + bowel sounds  Ext: no pedal edema bilaterally  Neuro: no new FND  Musculoskeletal: No digital cyanosis, clubbing  Skin: No rashes  Psych: Normal affect and demeanor, alert  and oriented x3     Data Reviewed:  I have personally reviewed following labs and imaging studies  Micro Results Recent Results (from the past 240 hour(s))  Blood culture (routine x 2)     Status: None (Preliminary result)   Collection Time: 09/17/18  4:36 PM  Result Value Ref Range Status   Specimen Description BLOOD RIGHT WRIST  Final   Special Requests   Final    BOTTLES DRAWN AEROBIC AND ANAEROBIC Blood Culture results may not be optimal due to an inadequate volume of blood received in culture bottles   Culture   Final    NO GROWTH 3 DAYS Performed at [facility], [place] [address], [place], [place] [address]    Report Status PENDING  Incomplete  Blood culture (routine x 2)     Status: None (Preliminary result)   Collection Time: 09/17/18  5:13 PM  Result Value Ref Range Status   Specimen Description BLOOD RIGHT FOREARM  Final   Special Requests   Final    BOTTLES DRAWN AEROBIC AND ANAEROBIC Blood Culture adequate volume   Culture   Final    NO GROWTH 3 DAYS Performed at [facility], [place] [address], [place], [place] [address]    Report Status PENDING  Incomplete  Surgical pcr screen     Status: None   Collection Time: 09/18/18  1:55 PM  Result Value Ref Range Status   MRSA, PCR NEGATIVE NEGATIVE Final   Staphylococcus aureus NEGATIVE NEGATIVE Final    Comment: (NOTE) The Xpert SA Assay (FDA approved for NASAL specimens in patients 25 years of age and older), is one component of a  comprehensive surveillance program. It is not intended to diagnose infection nor to guide or monitor treatment. Performed at [facility], [place] [address] [place], [place] [address]     Radiology Reports Ir Fluoro Guide Cv Line Right  Result Date: 09/17/2018 CLINICAL DATA:  Occluded hemodialysis fistula. Patient presented for declot, but patient was found to be severely anemic with elevated WBC count. EXAM: EXAM RIGHT IJ CATHETER PLACEMENT UNDER ULTRASOUND AND FLUOROSCOPIC GUIDANCE TECHNIQUE: The procedure, risks (including but not limited to bleeding, infection, organ damage, pneumothorax), benefits, and alternatives were explained to the patient. Questions regarding the procedure were encouraged and answered. The patient understands and consents to the procedure.On a patency of the right IJ vein was confirmed with ultrasound with image documentation. An appropriate skin site was determined. Skin site was marked. Region was prepped using maximum barrier technique including cap and mask, sterile gown, sterile gloves, large sterile sheet, and Chlorhexidine as cutaneous antisepsis. The region was infiltrated locally with 1% lidocaine. Under real-time ultrasound guidance, the right IJ vein was accessed with a 19 gauge needle; the needle tip within the vein was confirmed with ultrasound image documentation. The needle exchanged over a guidewire for vascular dilator which allowed advancement of a 20 cm Trialysis catheter. This was positioned with the tip at the cavoatrial junction. Spot chest radiograph shows good positioning and no pneumothorax. Catheter was flushed and sutured externally with 0-Prolene sutures. Patient tolerated the procedure well. FLUOROSCOPY TIME:  1.1 minute; 458 uGym2 DAP COMPLICATIONS: COMPLICATIONS none IMPRESSION: 1. Technically successful right IJ Trialysis catheter placement. Electronically Signed   By: Lucrezia Europe M.D.   On: 09/17/2018 15:52   Ir US Guide Vasc Access  Right  Result Date: 09/17/2018 CLINICAL DATA:  Occluded hemodialysis fistula. Patient presented for declot, but patient was found to  be severely anemic with elevated WBC count. EXAM: EXAM RIGHT IJ CATHETER PLACEMENT UNDER ULTRASOUND AND FLUOROSCOPIC GUIDANCE TECHNIQUE: The procedure, risks (including but not limited to bleeding, infection, organ damage, pneumothorax), benefits, and alternatives were explained to the patient. Questions regarding the procedure were encouraged and answered. The patient understands and consents to the procedure.On a patency of the right IJ vein was confirmed with ultrasound with image documentation. An appropriate skin site was determined. Skin site was marked. Region was prepped using maximum barrier technique including cap and mask, sterile gown, sterile gloves, large sterile sheet, and Chlorhexidine as cutaneous antisepsis. The region was infiltrated locally with 1% lidocaine. Under real-time ultrasound guidance, the right IJ vein was accessed with a 19 gauge needle; the needle tip within the vein was confirmed with ultrasound image documentation. The needle exchanged over a guidewire for vascular dilator which allowed advancement of a 20 cm Trialysis catheter. This was positioned with the tip at the cavoatrial junction. Spot chest radiograph shows good positioning and no pneumothorax. Catheter was flushed and sutured externally with 0-Prolene sutures. Patient tolerated the procedure well. FLUOROSCOPY TIME:  1.1 minute; 466 uGym2 DAP COMPLICATIONS: COMPLICATIONS none IMPRESSION: 1. Technically successful right IJ Trialysis catheter placement. Electronically Signed   By: Lucrezia Europe M.D.   On: 09/17/2018 15:52   Dg Chest Port 1 View  Result Date: 09/18/2018 CLINICAL DATA:  Right IJ dialysis catheter reposition EXAM: PORTABLE CHEST 1 VIEW COMPARISON:  09/18/2018 FINDINGS: Right IJ dialysis catheter has been retracted into the SVC RA junction level. Stable cardiomegaly with  vascular congestion and basilar atelectasis. Right hemidiaphragm elevated. No large effusion or pneumothorax. Trachea is midline. Degenerative changes of the spine. Left cephalic arch vascular stents evident. IMPRESSION: Right IJ temporary dialysis catheter tip SVC RA junction. Stable cardiomegaly with vascular congestion and basilar atelectasis. Electronically Signed   By: Jerilynn Mages.  Shick M.D.   On: 09/18/2018 18:58   Dg Chest Port 1 View  Result Date: 09/18/2018 CLINICAL DATA:  Short of breath. Encounter for central line placement. EXAM: PORTABLE CHEST 1 VIEW COMPARISON:  09/17/2018 FINDINGS: Double-lumen right internal jugular central venous line is without change from the prior study. Tip projects in the right atrium. There is vascular congestion with mild right basilar atelectasis similar to the previous day's study. No new lung abnormalities. No pneumothorax. IMPRESSION: 1. No significant change from the previous day's study. 2. Right double-lumen center venous catheter tip projects in the right atrium. No pneumothorax. 3. Vascular congestion and right basilar atelectasis. Electronically Signed   By: Lajean Manes M.D.   On: 09/18/2018 14:33   Dg Chest Portable 1 View  Result Date: 09/17/2018 CLINICAL DATA:  Tachycardia, elevated WBC. Pt denies chest complaints at this time. Pt currently wearing oxygen. EXAM: PORTABLE CHEST 1 VIEW COMPARISON:  02/02/2017 FINDINGS: RIGHT-sided IJ central line has been placed, tip probably at the level of the UPPER RIGHT atrium. No pneumothorax. The heart is enlarged. There is pulmonary vascular congestion and RIGHT LOWER lobe atelectasis. IMPRESSION: Interval placement of RIGHT IJ central line, tip probably to level of the UPPER RIGHT atrium. Electronically Signed   By: Nolon Nations M.D.   On: 09/17/2018 16:52    Lab Data:  CBC: Recent Labs  Lab 09/14/18 0637 09/17/18 1206 09/18/18 0429 09/19/18 0425 09/20/18 0703  WBC 16.5* 25.7* 18.3* 16.7* 15.9*   NEUTROABS 13.7*  --   --   --   --   HGB 8.7* 4.9* 9.7* 10.4* 10.1*  HCT 31.7* 17.7*  33.7* 36.1 36.0  MCV 101.6* 101.1* 95.7 95.5 96.5  PLT 266 419* 332 304 967   Basic Metabolic Panel: Recent Labs  Lab 09/14/18 0637 09/17/18 1206 09/18/18 0233 09/18/18 0429 09/19/18 0425 09/20/18 0703  NA 133* 136  --  136 132* 133*  K 3.8 4.8  --  5.4* 4.4 4.7  CL 96* 100  --  98 96* 96*  CO2 26 23  --  21* 22 25  GLUCOSE 176* 113*  --  109* 155* 89  BUN 37* 91*  --  95* 41* 52*  CREATININE 5.80* 12.30*  --  13.36* 7.83* 10.17*  CALCIUM 8.9 9.0  --  8.9 8.4* 9.0  PHOS  --   --  8.6*  --  5.3* 7.2*   GFR: Estimated Creatinine Clearance: 9.5 mL/min (A) (by C-G formula based on SCr of 10.17 mg/dL (H)). Liver Function Tests: Recent Labs  Lab 09/18/18 0429 09/19/18 0425 09/20/18 0703  AST 13*  --   --   ALT 11  --   --   ALKPHOS 85  --   --   BILITOT 1.2  --   --   PROT 8.5*  --   --   ALBUMIN 2.8* 2.9* 2.6*   No results for input(s): LIPASE, AMYLASE in the last 168 hours. No results for input(s): AMMONIA in the last 168 hours. Coagulation Profile: Recent Labs  Lab 09/17/18 1206  INR 1.30   Cardiac Enzymes: No results for input(s): CKTOTAL, CKMB, CKMBINDEX, TROPONINI in the last 168 hours. BNP (last 3 results) No results for input(s): PROBNP in the last 8760 hours. HbA1C: No results for input(s): HGBA1C in the last 72 hours. CBG: Recent Labs  Lab 09/18/18 1634 09/19/18 0756 09/19/18 1156 09/19/18 1724 09/19/18 2133  GLUCAP 161* 96 166* 161* 112*   Lipid Profile: No results for input(s): CHOL, HDL, LDLCALC, TRIG, CHOLHDL, LDLDIRECT in the last 72 hours. Thyroid Function Tests: No results for input(s): TSH, T4TOTAL, FREET4, T3FREE, THYROIDAB in the last 72 hours. Anemia Panel: No results for input(s): VITAMINB12, FOLATE, FERRITIN, TIBC, IRON, RETICCTPCT in the last 72 hours. Urine analysis:    Component Value Date/Time   COLORURINE YELLOW 07/18/2013 0831    APPEARANCEUR CLOUDY (A) 07/18/2013 0831   LABSPEC 1.012 07/18/2013 0831   PHURINE 5.0 07/18/2013 0831   GLUCOSEU NEGATIVE 07/18/2013 0831   HGBUR SMALL (A) 07/18/2013 0831   BILIRUBINUR NEGATIVE 07/18/2013 0831   KETONESUR NEGATIVE 07/18/2013 0831   PROTEINUR 30 (A) 07/18/2013 0831   UROBILINOGEN 0.2 07/18/2013 0831   NITRITE NEGATIVE 07/18/2013 0831   LEUKOCYTESUR MODERATE (A) 07/18/2013 0831     Anglea Gordner M.D. [facility] 09/20/2018, 12:32 PM  Pager: [phone] Between 7am to 7pm - call Pager - [phone]  After 7pm go to [email or web address] - password [email or web address]  Call night coverage person covering after 7pm

## 2018-09-20 NOTE — Procedures (Signed)
Pre-procedure Diagnosis: ESRD Post-procedure Diagnosis: Same  Successful fluoro guided conversion of temporary HD catheter to a tunneled HD catheter with tips terminating within the superior aspect of the right atrium.    Prolonged conversations were held patient regarding potential intervention on her thrombosed left upper arm fistula however following this prolonged detailed conversation, the patient does not feel she would be able to tolerate attempted declot procedure.  As such, fistulalysis was not attempted.   Complications: None Immediate  EBL: Minimal   The catheter is ready for immediate use.   Ronny Bacon, MD Pager #: [phone]

## 2018-09-22 LAB — CULTURE, BLOOD (ROUTINE X 2)
Culture: NO GROWTH
Culture: NO GROWTH
Special Requests: ADEQUATE

## 2018-09-23 DIAGNOSIS — D631 Anemia in chronic kidney disease: Secondary | ICD-10-CM | POA: Diagnosis not present

## 2018-09-23 DIAGNOSIS — N186 End stage renal disease: Secondary | ICD-10-CM | POA: Diagnosis not present

## 2018-09-23 DIAGNOSIS — D509 Iron deficiency anemia, unspecified: Secondary | ICD-10-CM | POA: Diagnosis not present

## 2018-09-23 DIAGNOSIS — E1129 Type 2 diabetes mellitus with other diabetic kidney complication: Secondary | ICD-10-CM | POA: Diagnosis not present

## 2018-09-23 DIAGNOSIS — N2581 Secondary hyperparathyroidism of renal origin: Secondary | ICD-10-CM | POA: Diagnosis not present

## 2018-09-24 DIAGNOSIS — J309 Allergic rhinitis, unspecified: Secondary | ICD-10-CM | POA: Diagnosis not present

## 2018-09-24 DIAGNOSIS — H903 Sensorineural hearing loss, bilateral: Secondary | ICD-10-CM | POA: Diagnosis not present

## 2018-09-25 DIAGNOSIS — N186 End stage renal disease: Secondary | ICD-10-CM | POA: Diagnosis not present

## 2018-09-25 DIAGNOSIS — D509 Iron deficiency anemia, unspecified: Secondary | ICD-10-CM | POA: Diagnosis not present

## 2018-09-25 DIAGNOSIS — N2581 Secondary hyperparathyroidism of renal origin: Secondary | ICD-10-CM | POA: Diagnosis not present

## 2018-09-25 DIAGNOSIS — D631 Anemia in chronic kidney disease: Secondary | ICD-10-CM | POA: Diagnosis not present

## 2018-09-25 DIAGNOSIS — E1129 Type 2 diabetes mellitus with other diabetic kidney complication: Secondary | ICD-10-CM | POA: Diagnosis not present

## 2018-09-27 ENCOUNTER — Other Ambulatory Visit: Payer: Self-pay

## 2018-09-27 DIAGNOSIS — D509 Iron deficiency anemia, unspecified: Secondary | ICD-10-CM | POA: Diagnosis not present

## 2018-09-27 DIAGNOSIS — N186 End stage renal disease: Secondary | ICD-10-CM | POA: Diagnosis not present

## 2018-09-27 DIAGNOSIS — N2581 Secondary hyperparathyroidism of renal origin: Secondary | ICD-10-CM | POA: Diagnosis not present

## 2018-09-27 DIAGNOSIS — E1129 Type 2 diabetes mellitus with other diabetic kidney complication: Secondary | ICD-10-CM | POA: Diagnosis not present

## 2018-09-27 DIAGNOSIS — D631 Anemia in chronic kidney disease: Secondary | ICD-10-CM | POA: Diagnosis not present

## 2018-09-27 DIAGNOSIS — Z992 Dependence on renal dialysis: Secondary | ICD-10-CM | POA: Diagnosis not present

## 2018-09-30 DIAGNOSIS — E1129 Type 2 diabetes mellitus with other diabetic kidney complication: Secondary | ICD-10-CM | POA: Diagnosis not present

## 2018-09-30 DIAGNOSIS — N2581 Secondary hyperparathyroidism of renal origin: Secondary | ICD-10-CM | POA: Diagnosis not present

## 2018-09-30 DIAGNOSIS — D631 Anemia in chronic kidney disease: Secondary | ICD-10-CM | POA: Diagnosis not present

## 2018-09-30 DIAGNOSIS — N186 End stage renal disease: Secondary | ICD-10-CM | POA: Diagnosis not present

## 2018-09-30 DIAGNOSIS — D509 Iron deficiency anemia, unspecified: Secondary | ICD-10-CM | POA: Diagnosis not present

## 2018-10-01 ENCOUNTER — Other Ambulatory Visit: Payer: Self-pay

## 2018-10-01 ENCOUNTER — Ambulatory Visit (INDEPENDENT_AMBULATORY_CARE_PROVIDER_SITE_OTHER)
Admission: RE | Admit: 2018-10-01 | Discharge: 2018-10-01 | Disposition: A | Discharge status: Home / Self Care | Payer: Medicare Other | Source: Ambulatory Visit | Attending: Vascular Surgery | Admitting: Vascular Surgery

## 2018-10-01 ENCOUNTER — Ambulatory Visit (HOSPITAL_COMMUNITY)
Admission: RE | Admit: 2018-10-01 | Discharge: 2018-10-01 | Disposition: A | Discharge status: Home / Self Care | Payer: Medicare Other | Source: Ambulatory Visit | Attending: Vascular Surgery | Admitting: Vascular Surgery

## 2018-10-01 ENCOUNTER — Encounter: Payer: Self-pay | Admitting: Vascular Surgery

## 2018-10-01 ENCOUNTER — Other Ambulatory Visit: Payer: Self-pay | Admitting: *Deleted

## 2018-10-01 ENCOUNTER — Ambulatory Visit (INDEPENDENT_AMBULATORY_CARE_PROVIDER_SITE_OTHER): Payer: Medicare Other | Admitting: Vascular Surgery

## 2018-10-01 ENCOUNTER — Encounter: Payer: Self-pay | Admitting: *Deleted

## 2018-10-01 VITALS — BP 147/74 | HR 83 | Temp 98.7°F | Resp 20 | Ht 66.5 in | Wt 394.6 lb

## 2018-10-01 DIAGNOSIS — N186 End stage renal disease: Secondary | ICD-10-CM

## 2018-10-01 DIAGNOSIS — Z992 Dependence on renal dialysis: Secondary | ICD-10-CM | POA: Diagnosis not present

## 2018-10-01 NOTE — Progress Notes (Signed)
Vascular and Vein Specialist of [facility]  Patient name: [name] MRN: [id number] DOB: 03/09/1961 Sex: female  REASON FOR CONSULT: Discuss access for hemodialysis  HPI: [name] is a 57 y.o. female, who is here today for access evaluation.  She has had a functional left upper arm AV fistula she is used for 5 years.  This recently clotted and was unable to be opened by interventional radiology.  She had a right IJ tunneled catheter placed and is here for question of new access options.  He is here today with her mother.  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  This is worse than her right.  She reports numbness and tingling and cold sensation in her right hand.  She is never had a right hand or upper arm access.  Past Medical History:  Diagnosis Date  . Chronic diastolic CHF (congestive heart failure) ([place])   . Chronic kidney disease (CKD), stage III (moderate) ([place])    [name] nephrology lov note 05-12-2013 on pt chart now on HD  . DCM (dilated cardiomyopathy) ([place])    nonischemic - EF now 60-65% by echo 2017  . Depression   . DM type 2 (diabetes mellitus, type 2) (HCC)    diet controlledwith retinopathy and nephropathy  . ESRD on hemodialysis ([place]) 08/21/2017  . GERD (gastroesophageal reflux disease)   . H/O cardiac arrest 10/2012  . Hard of hearing 10/2012   now wears 2 hearing aids  . History of hemodialysis dec 2013  . HTN (hypertension)   . Hydradenitis   . Hyperkalemia   . Hyperlipidemia   . Hypothyroidism   . Iron deficiency anemia   . Morbid obesity ([place])   . Multinodular goiter   . Neuropathic pain 08/21/2017  . Orthostatic hypotension 06/27/2018  . Pneumonia Nov 10, 2012  . Sleep apnea    Intolerant to CPAP  . Tinnitus 12/26/2016  . Typical atrial flutter ([place]) 04/10/2017  . Urinary tract infection    taking antibiotics for 3 days prior to surgery    Family History  Problem Relation Age of Onset  . Coronary artery  disease Mother   . Hypertension Mother   . Diabetes type II Mother   . Coronary artery disease Father   . Hypertension Father   . Malignant hyperthermia Father   . Cancer Maternal Grandfather        ? Type  . Kidney failure Maternal Grandmother     SOCIAL HISTORY: Social History   Socioeconomic History  . Marital status: Married    Spouse name: Not on file  . Number of children: Not on file  . Years of education: Not on file  . Highest education level: Not on file  Occupational History  . Not on file  Social Needs  . Financial resource strain: Not on file  . Food insecurity:    Worry: Not on file    Inability: Not on file  . Transportation needs:    Medical: Not on file    Non-medical: Not on file  Tobacco Use  . Smoking status: Former Smoker    Packs/day: 0.25    Years: 1.00    Pack years: 0.25    Types: Cigarettes    Last attempt to quit: 11/28/1979    Years since quitting: 38.8  . Smokeless tobacco: Never Used  Substance and Sexual Activity  . Alcohol use: No  . Drug use: No  . Sexual activity: Never    Birth control/protection:  Abstinence  Lifestyle  . Physical activity:    Days per week: Not on file    Minutes per session: Not on file  . Stress: Not on file  Relationships  . Social connections:    Talks on phone: Not on file    Gets together: Not on file    Attends religious service: Not on file    Active member of club or organization: Not on file    Attends meetings of clubs or organizations: Not on file    Relationship status: Not on file  . Intimate partner violence:    Fear of current or ex partner: Not on file    Emotionally abused: Not on file    Physically abused: Not on file    Forced sexual activity: Not on file  Other Topics Concern  . Not on file  Social History Narrative  . Not on file    Allergies  Allergen Reactions  . Rosiglitazone Other (See Comments)    "Avandia" = Reaction not recalled  . Amoxicillin Rash and Other (See  Comments)    Tolerated Zosyn 01/2013. [place] Has patient had a PCN reaction causing immediate rash, facial/tongue/throat swelling, SOB or lightheadedness with hypotension: Yes Has patient had a PCN reaction causing severe rash involving mucus membranes or skin necrosis: No Has patient had a PCN reaction that required hospitalization: No Has patient had a PCN reaction occurring within the last 10 years: No If all of the above answers are "NO", then may proceed with Cephalosporin use.   Marland Kitchen Amoxicillin-Pot Clavulanate Diarrhea    Current Outpatient Medications  Medication Sig Dispense Refill  . acetaminophen (TYLENOL) 500 MG tablet Take 1,000 mg by mouth daily as needed for moderate pain.    Marland Kitchen aspirin EC 81 MG tablet Take 81 mg by mouth at bedtime.     . Calcium Acetate 667 MG TABS Take 667 mg by mouth See admin instructions. Take 667 mg by mouth three times a day with meals and 667 mg with each snack    . ciprofloxacin (CIPRO) 500 MG tablet Take 1 tablet (500 mg total) by mouth daily. 30 tablet 1  . Dakins (SODIUM HYPOCHLORITE) 0.25 % SOLN Apply 1 application topically See admin instructions. Apply as directed to groin and buttocks 3 times a day    . diltiazem (CARDIZEM) 30 MG tablet Take 1 tablet every 4 hours AS NEEDED for rapid heart rate lasting more than 30 mins as long as top blood pressure >100. (Patient taking differently: Take 30 mg by mouth every 4 (four) hours as needed (FOR A RAPID HEART RATE LASTING MORE THAN 30 MINUTES AS LONG AS SYSTOLIC ("top number") READING IS >100). ) 30 tablet 2  . doxycycline (VIBRA-TABS) 100 MG tablet Take 1 tablet (100 mg total) by mouth 2 (two) times daily. 20 tablet 0  . DULoxetine (CYMBALTA) 60 MG capsule Take 1 capsule (60 mg total) by mouth daily. 30 capsule 0  . famotidine (PEPCID) 20 MG tablet TAKE 1 TABLET BY MOUTH AT BEDTIME (Patient taking differently: Take 20 mg by mouth at bedtime) 30 tablet 0  . feeding supplement, RESOURCE BREEZE, (RESOURCE  BREEZE) LIQD Take 1 Container by mouth 2 (two) times daily between meals.     Marland Kitchen glipiZIDE (GLUCOTROL) 5 MG tablet Take 0.5 tablets (2.5 mg total) by mouth daily. 30 tablet 0  . levothyroxine (SYNTHROID, LEVOTHROID) 75 MCG tablet Take 75 mcg by mouth daily.  0  . midodrine (PROAMATINE) 10 MG tablet  Take 1 tablet (10 mg total) by mouth 3 (three) times daily. (Patient taking differently: Take 10 mg by mouth every Monday, Wednesday, and Friday. ) 90 tablet 0  . multivitamin (RENA-VIT) TABS tablet Take 1 tablet by mouth daily.  6  . oxyCODONE-acetaminophen (PERCOCET/ROXICET) 5-325 MG tablet Take 1-2 tablets by mouth every 6 (six) hours as needed. Patient has not taken since started dialysis 08/2013 due to causes hypotension (Patient taking differently: Take 2 tablets by mouth every 6 (six) hours as needed for moderate pain. ) 6 tablet 0  . pravastatin (PRAVACHOL) 40 MG tablet Take 40 mg by mouth at bedtime.     . Probiotic Product (PROBIOTIC DAILY PO) Take 1 capsule by mouth 2 (two) times daily.     . sevelamer carbonate (RENVELA) 800 MG tablet Take 2,400 mg by mouth 3 (three) times daily with meals.    . sucroferric oxyhydroxide (VELPHORO) 500 MG chewable tablet Chew 1,000 mg by mouth 3 (three) times daily with meals.      No current facility-administered medications for this visit.     REVIEW OF SYSTEMS:  [X]  denotes positive finding, [ ]  denotes negative finding Cardiac  Comments:  Chest pain or chest pressure:    Shortness of breath upon exertion: x   Short of breath when lying flat: x   Irregular heart rhythm: x       Vascular    Pain in calf, thigh, or hip brought on by ambulation:    Pain in feet at night that wakes you up from your sleep:     Blood clot in your veins:    Leg swelling:         Pulmonary    Oxygen at home: x   Productive cough:     Wheezing:         Neurologic    Sudden weakness in arms or legs:     Sudden numbness in arms or legs:     Sudden onset of difficulty  speaking or slurred speech:    Temporary loss of vision in one eye:     Problems with dizziness:         Gastrointestinal    Blood in stool:     Vomited blood:         Genitourinary    Burning when urinating:     Blood in urine:        Psychiatric    Major depression:         Hematologic    Bleeding problems:    Problems with blood clotting too easily:        Skin    Rashes or ulcers:        Constitutional    Fever or chills:      PHYSICAL EXAM: Vitals:   10/01/18 0943  BP: (!) 147/74  Pulse: 83  Resp: 20  Temp: 98.7 F (37.1 C)  TempSrc: Oral  SpO2: 91%  Weight: (!) 394 lb 10 oz (179 kg)  Height: 5' 6.5" (1.689 m)    GENERAL: The patient is a well-nourished female, in no acute distress. The vital signs are documented above. CARDIOVASCULAR: Palpable radial pulses bilaterally.  Her right hand is warm with a 2-3+ radial pulse.  She actually has a glove on her hand due to her chronic coldness and neuropathy.  All surface veins bilaterally.  She has great deal of sclerosis in the old left upper arm fistula and actually has an eschar over the middle  portion of this. PULMONARY: There is good air exchange  ABDOMEN: Soft and non-tender  MUSCULOSKELETAL: There are no major deformities or cyanosis. NEUROLOGIC: No focal weakness or paresthesias are detected. SKIN: There are no ulcers or rashes noted. PSYCHIATRIC: The patient has a normal affect.  DATA:  She did undergo noninvasive studies in our office revealing moderate sized cephalic vein on the right from the antecubital area proximally.  MEDICAL ISSUES: Long discussion with patient regarding options for access.  They are questioning whether the left upper arm fistula could be open.  I explained that this could be attempted at the time of surgery would be very unlikely that this would be successful since it is been used for so many years.  Quite concerned regarding placing a right arm access.  She is right-hand dominant  and already has significant neuropathy coldness and aching in her right hand even with normal arterial flow.  I have recommended attempted thrombectomy of her fistula which I suspect would be unsuccessful.  I then suggested placement of a left arm AV Gore-Tex graft.  I explained that this would be much more likely to thrombose over time but is usually easier to open and maintain access.  We will schedule this at her earliest convenience on a nondialysis day   Rosetta Posner, MD [facility] Vascular and Vein Specialists of [facility] Tel [phone] Pager [phone]

## 2018-10-02 DIAGNOSIS — N186 End stage renal disease: Secondary | ICD-10-CM | POA: Diagnosis not present

## 2018-10-02 DIAGNOSIS — D631 Anemia in chronic kidney disease: Secondary | ICD-10-CM | POA: Diagnosis not present

## 2018-10-02 DIAGNOSIS — E1129 Type 2 diabetes mellitus with other diabetic kidney complication: Secondary | ICD-10-CM | POA: Diagnosis not present

## 2018-10-02 DIAGNOSIS — D509 Iron deficiency anemia, unspecified: Secondary | ICD-10-CM | POA: Diagnosis not present

## 2018-10-02 DIAGNOSIS — N2581 Secondary hyperparathyroidism of renal origin: Secondary | ICD-10-CM | POA: Diagnosis not present

## 2018-10-04 DIAGNOSIS — E1129 Type 2 diabetes mellitus with other diabetic kidney complication: Secondary | ICD-10-CM | POA: Diagnosis not present

## 2018-10-04 DIAGNOSIS — N186 End stage renal disease: Secondary | ICD-10-CM | POA: Diagnosis not present

## 2018-10-04 DIAGNOSIS — N2581 Secondary hyperparathyroidism of renal origin: Secondary | ICD-10-CM | POA: Diagnosis not present

## 2018-10-04 DIAGNOSIS — D509 Iron deficiency anemia, unspecified: Secondary | ICD-10-CM | POA: Diagnosis not present

## 2018-10-04 DIAGNOSIS — D631 Anemia in chronic kidney disease: Secondary | ICD-10-CM | POA: Diagnosis not present

## 2018-10-07 ENCOUNTER — Other Ambulatory Visit: Payer: Self-pay | Admitting: Infectious Disease

## 2018-10-07 DIAGNOSIS — L732 Hidradenitis suppurativa: Secondary | ICD-10-CM

## 2018-10-07 DIAGNOSIS — N2581 Secondary hyperparathyroidism of renal origin: Secondary | ICD-10-CM | POA: Diagnosis not present

## 2018-10-07 DIAGNOSIS — D509 Iron deficiency anemia, unspecified: Secondary | ICD-10-CM | POA: Diagnosis not present

## 2018-10-07 DIAGNOSIS — N186 End stage renal disease: Secondary | ICD-10-CM | POA: Diagnosis not present

## 2018-10-07 DIAGNOSIS — D631 Anemia in chronic kidney disease: Secondary | ICD-10-CM | POA: Diagnosis not present

## 2018-10-07 DIAGNOSIS — E1129 Type 2 diabetes mellitus with other diabetic kidney complication: Secondary | ICD-10-CM | POA: Diagnosis not present

## 2018-10-08 DIAGNOSIS — I480 Paroxysmal atrial fibrillation: Secondary | ICD-10-CM | POA: Diagnosis not present

## 2018-10-08 DIAGNOSIS — I130 Hypertensive heart and chronic kidney disease with heart failure and stage 1 through stage 4 chronic kidney disease, or unspecified chronic kidney disease: Secondary | ICD-10-CM | POA: Diagnosis not present

## 2018-10-08 DIAGNOSIS — N186 End stage renal disease: Secondary | ICD-10-CM | POA: Diagnosis not present

## 2018-10-08 DIAGNOSIS — I951 Orthostatic hypotension: Secondary | ICD-10-CM | POA: Diagnosis not present

## 2018-10-08 DIAGNOSIS — I471 Supraventricular tachycardia: Secondary | ICD-10-CM | POA: Diagnosis not present

## 2018-10-08 DIAGNOSIS — J309 Allergic rhinitis, unspecified: Secondary | ICD-10-CM | POA: Diagnosis not present

## 2018-10-08 DIAGNOSIS — D509 Iron deficiency anemia, unspecified: Secondary | ICD-10-CM | POA: Diagnosis not present

## 2018-10-09 ENCOUNTER — Telehealth: Payer: Self-pay | Admitting: *Deleted

## 2018-10-09 DIAGNOSIS — N2581 Secondary hyperparathyroidism of renal origin: Secondary | ICD-10-CM | POA: Diagnosis not present

## 2018-10-09 DIAGNOSIS — E1129 Type 2 diabetes mellitus with other diabetic kidney complication: Secondary | ICD-10-CM | POA: Diagnosis not present

## 2018-10-09 DIAGNOSIS — D631 Anemia in chronic kidney disease: Secondary | ICD-10-CM | POA: Diagnosis not present

## 2018-10-09 DIAGNOSIS — N186 End stage renal disease: Secondary | ICD-10-CM | POA: Diagnosis not present

## 2018-10-09 DIAGNOSIS — D509 Iron deficiency anemia, unspecified: Secondary | ICD-10-CM | POA: Diagnosis not present

## 2018-10-09 MED ORDER — VANCOMYCIN HCL 10 G IV SOLR
1500.0000 mg | INTRAVENOUS | Status: AC
  Administered 2018-10-10: 1500 mg via INTRAVENOUS
  Filled 2018-10-09: qty 1500

## 2018-10-09 NOTE — Telephone Encounter (Signed)
Patient requesting refill of Cipro 500 mg daily. Patient has been on/off IV antibiotic treatment since she was seen in clinic. Has not followed up due to hospitalizations. Please advise what she should be taking at this time. Landis Gandy, RN

## 2018-10-10 ENCOUNTER — Encounter (HOSPITAL_COMMUNITY): Payer: Self-pay | Admitting: *Deleted

## 2018-10-10 ENCOUNTER — Ambulatory Visit (HOSPITAL_COMMUNITY)
Admission: RE | Admit: 2018-10-10 | Discharge: 2018-10-10 | Disposition: A | Discharge status: Home / Self Care | Payer: Medicare Other | Source: Ambulatory Visit | Attending: Vascular Surgery | Admitting: Vascular Surgery

## 2018-10-10 ENCOUNTER — Encounter (HOSPITAL_COMMUNITY)
Admission: RE | Disposition: A | Discharge status: Home / Self Care | Payer: Self-pay | Source: Ambulatory Visit | Attending: Vascular Surgery

## 2018-10-10 ENCOUNTER — Ambulatory Visit (HOSPITAL_COMMUNITY): Payer: Medicare Other | Admitting: Anesthesiology

## 2018-10-10 ENCOUNTER — Other Ambulatory Visit: Payer: Self-pay

## 2018-10-10 DIAGNOSIS — N186 End stage renal disease: Secondary | ICD-10-CM | POA: Insufficient documentation

## 2018-10-10 DIAGNOSIS — G4730 Sleep apnea, unspecified: Secondary | ICD-10-CM | POA: Diagnosis not present

## 2018-10-10 DIAGNOSIS — Z7984 Long term (current) use of oral hypoglycemic drugs: Secondary | ICD-10-CM | POA: Insufficient documentation

## 2018-10-10 DIAGNOSIS — E1140 Type 2 diabetes mellitus with diabetic neuropathy, unspecified: Secondary | ICD-10-CM | POA: Insufficient documentation

## 2018-10-10 DIAGNOSIS — E039 Hypothyroidism, unspecified: Secondary | ICD-10-CM | POA: Diagnosis not present

## 2018-10-10 DIAGNOSIS — E11319 Type 2 diabetes mellitus with unspecified diabetic retinopathy without macular edema: Secondary | ICD-10-CM | POA: Diagnosis not present

## 2018-10-10 DIAGNOSIS — Z992 Dependence on renal dialysis: Secondary | ICD-10-CM | POA: Diagnosis not present

## 2018-10-10 DIAGNOSIS — Z6841 Body Mass Index (BMI) 40.0 and over, adult: Secondary | ICD-10-CM | POA: Insufficient documentation

## 2018-10-10 DIAGNOSIS — T82868A Thrombosis of vascular prosthetic devices, implants and grafts, initial encounter: Secondary | ICD-10-CM | POA: Diagnosis not present

## 2018-10-10 DIAGNOSIS — I132 Hypertensive heart and chronic kidney disease with heart failure and with stage 5 chronic kidney disease, or end stage renal disease: Secondary | ICD-10-CM | POA: Diagnosis not present

## 2018-10-10 DIAGNOSIS — Z7982 Long term (current) use of aspirin: Secondary | ICD-10-CM | POA: Diagnosis not present

## 2018-10-10 DIAGNOSIS — Z87891 Personal history of nicotine dependence: Secondary | ICD-10-CM | POA: Insufficient documentation

## 2018-10-10 DIAGNOSIS — Z79890 Hormone replacement therapy (postmenopausal): Secondary | ICD-10-CM | POA: Insufficient documentation

## 2018-10-10 DIAGNOSIS — K219 Gastro-esophageal reflux disease without esophagitis: Secondary | ICD-10-CM | POA: Diagnosis not present

## 2018-10-10 DIAGNOSIS — I120 Hypertensive chronic kidney disease with stage 5 chronic kidney disease or end stage renal disease: Secondary | ICD-10-CM | POA: Diagnosis not present

## 2018-10-10 DIAGNOSIS — E785 Hyperlipidemia, unspecified: Secondary | ICD-10-CM | POA: Insufficient documentation

## 2018-10-10 DIAGNOSIS — J449 Chronic obstructive pulmonary disease, unspecified: Secondary | ICD-10-CM | POA: Insufficient documentation

## 2018-10-10 DIAGNOSIS — Y832 Surgical operation with anastomosis, bypass or graft as the cause of abnormal reaction of the patient, or of later complication, without mention of misadventure at the time of the procedure: Secondary | ICD-10-CM | POA: Insufficient documentation

## 2018-10-10 DIAGNOSIS — Z79899 Other long term (current) drug therapy: Secondary | ICD-10-CM | POA: Diagnosis not present

## 2018-10-10 DIAGNOSIS — F329 Major depressive disorder, single episode, unspecified: Secondary | ICD-10-CM | POA: Diagnosis not present

## 2018-10-10 DIAGNOSIS — E1122 Type 2 diabetes mellitus with diabetic chronic kidney disease: Secondary | ICD-10-CM | POA: Insufficient documentation

## 2018-10-10 DIAGNOSIS — I5032 Chronic diastolic (congestive) heart failure: Secondary | ICD-10-CM | POA: Diagnosis not present

## 2018-10-10 HISTORY — PX: AV FISTULA PLACEMENT: SHX1204

## 2018-10-10 HISTORY — PX: THROMBECTOMY AND REVISION OF ARTERIOVENTOUS (AV) GORETEX  GRAFT: SHX6120

## 2018-10-10 LAB — POCT I-STAT 4, (NA,K, GLUC, HGB,HCT)
GLUCOSE: 143 mg/dL — AB (ref 70–99)
HEMATOCRIT: 38 % (ref 36.0–46.0)
HEMOGLOBIN: 12.9 g/dL (ref 12.0–15.0)
Potassium: 4 mmol/L (ref 3.5–5.1)
Sodium: 134 mmol/L — ABNORMAL LOW (ref 135–145)

## 2018-10-10 LAB — GLUCOSE, CAPILLARY: Glucose-Capillary: 125 mg/dL — ABNORMAL HIGH (ref 70–99)

## 2018-10-10 SURGERY — THROMBECTOMY AND REVISION OF ARTERIOVENTOUS (AV) GORETEX  GRAFT
Anesthesia: General | Site: Arm Upper | Laterality: Left

## 2018-10-10 MED ORDER — OXYCODONE HCL 5 MG/5ML PO SOLN: 5.0000 mg | Freq: Once | ORAL | Status: AC | PRN

## 2018-10-10 MED ORDER — ROCURONIUM BROMIDE 10 MG/ML (PF) SYRINGE
PREFILLED_SYRINGE | INTRAVENOUS | Status: DC | PRN
  Administered 2018-10-10: 40 mg via INTRAVENOUS

## 2018-10-10 MED ORDER — FENTANYL CITRATE (PF) 100 MCG/2ML IJ SOLN: 25.0000 ug | INTRAMUSCULAR | Status: DC | PRN

## 2018-10-10 MED ORDER — ONDANSETRON HCL 4 MG/2ML IJ SOLN
INTRAMUSCULAR | Status: AC
  Filled 2018-10-10: qty 2

## 2018-10-10 MED ORDER — ONDANSETRON HCL 4 MG/2ML IJ SOLN: 4.0000 mg | Freq: Once | INTRAMUSCULAR | Status: DC | PRN

## 2018-10-10 MED ORDER — SODIUM CHLORIDE 0.9 % IV SOLN
INTRAVENOUS | Status: DC
  Administered 2018-10-10 (×2): via INTRAVENOUS

## 2018-10-10 MED ORDER — SODIUM CHLORIDE 0.9 % IV SOLN
INTRAVENOUS | Status: DC | PRN
  Administered 2018-10-10: 40 ug/min via INTRAVENOUS

## 2018-10-10 MED ORDER — SODIUM CHLORIDE 0.9 % IV SOLN: INTRAVENOUS | Status: DC

## 2018-10-10 MED ORDER — DEXAMETHASONE SODIUM PHOSPHATE 10 MG/ML IJ SOLN
INTRAMUSCULAR | Status: DC | PRN
  Administered 2018-10-10: 4 mg via INTRAVENOUS

## 2018-10-10 MED ORDER — FENTANYL CITRATE (PF) 100 MCG/2ML IJ SOLN
INTRAMUSCULAR | Status: DC | PRN
  Administered 2018-10-10: 50 ug via INTRAVENOUS

## 2018-10-10 MED ORDER — HEMOSTATIC AGENTS (NO CHARGE) OPTIME
TOPICAL | Status: DC | PRN
  Administered 2018-10-10: 1 via TOPICAL

## 2018-10-10 MED ORDER — OXYCODONE HCL 5 MG PO TABS: 5.0000 mg | ORAL_TABLET | Freq: Four times a day (QID) | ORAL | 0 refills | Status: DC | PRN

## 2018-10-10 MED ORDER — MIDAZOLAM HCL 2 MG/2ML IJ SOLN
INTRAMUSCULAR | Status: AC
  Filled 2018-10-10: qty 2

## 2018-10-10 MED ORDER — HEPARIN SODIUM (PORCINE) 1000 UNIT/ML IJ SOLN
INTRAMUSCULAR | Status: DC | PRN
  Administered 2018-10-10: 5000 [IU] via INTRAVENOUS

## 2018-10-10 MED ORDER — LIDOCAINE HCL (CARDIAC) PF 100 MG/5ML IV SOSY
PREFILLED_SYRINGE | INTRAVENOUS | Status: DC | PRN
  Administered 2018-10-10: 60 mg via INTRAVENOUS

## 2018-10-10 MED ORDER — ESMOLOL HCL 100 MG/10ML IV SOLN
INTRAVENOUS | Status: DC | PRN
  Administered 2018-10-10: 10 mg via INTRAVENOUS
  Administered 2018-10-10: 20 mg via INTRAVENOUS

## 2018-10-10 MED ORDER — EPHEDRINE 5 MG/ML INJ
INTRAVENOUS | Status: AC
  Filled 2018-10-10: qty 10

## 2018-10-10 MED ORDER — MIDAZOLAM HCL 5 MG/5ML IJ SOLN
INTRAMUSCULAR | Status: DC | PRN
  Administered 2018-10-10: 2 mg via INTRAVENOUS

## 2018-10-10 MED ORDER — ESMOLOL HCL 100 MG/10ML IV SOLN: INTRAVENOUS | Status: DC | PRN

## 2018-10-10 MED ORDER — SODIUM CHLORIDE 0.9 % IV SOLN
INTRAVENOUS | Status: AC
  Filled 2018-10-10: qty 1.2

## 2018-10-10 MED ORDER — PROPOFOL 10 MG/ML IV BOLUS
INTRAVENOUS | Status: AC
  Filled 2018-10-10: qty 20

## 2018-10-10 MED ORDER — OXYCODONE HCL 5 MG PO TABS
5.0000 mg | ORAL_TABLET | Freq: Once | ORAL | Status: AC | PRN
  Administered 2018-10-10: 5 mg via ORAL

## 2018-10-10 MED ORDER — FENTANYL CITRATE (PF) 250 MCG/5ML IJ SOLN
INTRAMUSCULAR | Status: AC
  Filled 2018-10-10: qty 5

## 2018-10-10 MED ORDER — PROPOFOL 10 MG/ML IV BOLUS
INTRAVENOUS | Status: DC | PRN
  Administered 2018-10-10: 120 mg via INTRAVENOUS

## 2018-10-10 MED ORDER — CHLORHEXIDINE GLUCONATE 4 % EX LIQD: 60.0000 mL | Freq: Once | CUTANEOUS | Status: DC

## 2018-10-10 MED ORDER — ONDANSETRON HCL 4 MG/2ML IJ SOLN
INTRAMUSCULAR | Status: DC | PRN
  Administered 2018-10-10: 4 mg via INTRAVENOUS

## 2018-10-10 MED ORDER — SODIUM CHLORIDE 0.9 % IV SOLN
INTRAVENOUS | Status: DC | PRN
  Administered 2018-10-10: 500 mL

## 2018-10-10 MED ORDER — OXYCODONE HCL 5 MG PO TABS
ORAL_TABLET | ORAL | Status: AC
  Filled 2018-10-10: qty 1

## 2018-10-10 MED ORDER — SUCCINYLCHOLINE CHLORIDE 200 MG/10ML IV SOSY
PREFILLED_SYRINGE | INTRAVENOUS | Status: AC
  Filled 2018-10-10: qty 10

## 2018-10-10 MED ORDER — SUGAMMADEX SODIUM 200 MG/2ML IV SOLN
INTRAVENOUS | Status: AC
  Filled 2018-10-10: qty 2

## 2018-10-10 MED ORDER — 0.9 % SODIUM CHLORIDE (POUR BTL) OPTIME
TOPICAL | Status: DC | PRN
  Administered 2018-10-10: 1000 mL

## 2018-10-10 MED ORDER — SUGAMMADEX SODIUM 200 MG/2ML IV SOLN
INTRAVENOUS | Status: DC | PRN
  Administered 2018-10-10: 200 mg via INTRAVENOUS

## 2018-10-10 SURGICAL SUPPLY — 42 items
ADH SKN CLS APL DERMABOND .7 (GAUZE/BANDAGES/DRESSINGS) ×2
ADH SKN CLS LQ APL DERMABOND (GAUZE/BANDAGES/DRESSINGS) ×2
ARMBAND PINK RESTRICT EXTREMIT (MISCELLANEOUS) ×6 IMPLANT
CANISTER SUCT 3000ML PPV (MISCELLANEOUS) ×4 IMPLANT
CATH EMB 4FR 40CM (CATHETERS) ×4 IMPLANT
CATH EMB 4FR 80CM (CATHETERS) ×2 IMPLANT
CLIP VESOCCLUDE MED 6/CT (CLIP) ×4 IMPLANT
CLIP VESOCCLUDE SM WIDE 6/CT (CLIP) ×4 IMPLANT
COVER PROBE W GEL 5X96 (DRAPES) ×2 IMPLANT
COVER WAND RF STERILE (DRAPES) ×4 IMPLANT
DERMABOND ADHESIVE PROPEN (GAUZE/BANDAGES/DRESSINGS) ×2
DERMABOND ADVANCED (GAUZE/BANDAGES/DRESSINGS) ×2
DERMABOND ADVANCED .7 DNX12 (GAUZE/BANDAGES/DRESSINGS) ×2 IMPLANT
DERMABOND ADVANCED .7 DNX6 (GAUZE/BANDAGES/DRESSINGS) IMPLANT
ELECT REM PT RETURN 9FT ADLT (ELECTROSURGICAL) ×4
ELECTRODE REM PT RTRN 9FT ADLT (ELECTROSURGICAL) ×2 IMPLANT
GLOVE BIO SURGEON STRL SZ7.5 (GLOVE) ×4 IMPLANT
GOWN STRL REUS W/ TWL LRG LVL3 (GOWN DISPOSABLE) ×4 IMPLANT
GOWN STRL REUS W/ TWL XL LVL3 (GOWN DISPOSABLE) ×2 IMPLANT
GOWN STRL REUS W/TWL LRG LVL3 (GOWN DISPOSABLE) ×8
GOWN STRL REUS W/TWL XL LVL3 (GOWN DISPOSABLE) ×4
GRAFT GORETEX STRT 4-7X45 (Vascular Products) ×2 IMPLANT
HEMOSTAT SNOW SURGICEL 2X4 (HEMOSTASIS) ×2 IMPLANT
INSERT FOGARTY SM (MISCELLANEOUS) ×2 IMPLANT
KIT BASIN OR (CUSTOM PROCEDURE TRAY) ×4 IMPLANT
KIT TURNOVER KIT B (KITS) ×4 IMPLANT
NS IRRIG 1000ML POUR BTL (IV SOLUTION) ×4 IMPLANT
PACK CV ACCESS (CUSTOM PROCEDURE TRAY) ×4 IMPLANT
PAD ARMBOARD 7.5X6 YLW CONV (MISCELLANEOUS) ×8 IMPLANT
SUT GORETEX 6.0 TH-9 30 IN (SUTURE) ×2 IMPLANT
SUT GORETEX CV-6TTC-13 36IN (SUTURE) ×2 IMPLANT
SUT MNCRL AB 4-0 PS2 18 (SUTURE) ×4 IMPLANT
SUT PROLENE 5 0 C1 (SUTURE) ×2 IMPLANT
SUT PROLENE 6 0 BV (SUTURE) ×4 IMPLANT
SUT SILK 2 0 SH (SUTURE) IMPLANT
SUT VIC AB 3-0 SH 27 (SUTURE) ×8
SUT VIC AB 3-0 SH 27X BRD (SUTURE) ×4 IMPLANT
SYR 3ML LL SCALE MARK (SYRINGE) ×2 IMPLANT
SYR TOOMEY 50ML (SYRINGE) IMPLANT
TOWEL GREEN STERILE (TOWEL DISPOSABLE) ×4 IMPLANT
UNDERPAD 30X30 (UNDERPADS AND DIAPERS) ×4 IMPLANT
WATER STERILE IRR 1000ML POUR (IV SOLUTION) ×2 IMPLANT

## 2018-10-10 NOTE — Anesthesia Postprocedure Evaluation (Signed)
Anesthesia Post Note  Patient: BURDETTE GERGELY  Procedure(s) Performed: attempted of THROMBECTOMY OF ARTERIOVENTOUS FISTULA LEFT ARM (Left Arm Lower) INSERTION OF LEFT UPPER ARM brachial to axillary artery ARTERIOVENOUS (AV) GORE-TEX GRAFT using 4-7 stretch graft (Left Arm Upper)     Patient location during evaluation: PACU Anesthesia Type: General Level of consciousness: awake and alert Pain management: pain level controlled Vital Signs Assessment: post-procedure vital signs reviewed and stable Respiratory status: spontaneous breathing, nonlabored ventilation and respiratory function stable Cardiovascular status: blood pressure returned to baseline and stable Postop Assessment: no apparent nausea or vomiting Anesthetic complications: no    Last Vitals:  Vitals:   10/10/18 1322 10/10/18 1335  BP: (!) 113/59 129/64  Pulse: (!) 105 (!) 105  Resp: 18 17  Temp:    SpO2: 92% 94%    Last Pain:  Vitals:   10/10/18 1335  TempSrc:   PainSc: 0-No pain                 Audry Pili

## 2018-10-10 NOTE — Transfer of Care (Signed)
Immediate Anesthesia Transfer of Care Note  Patient: MAEVIS MUMBY  Procedure(s) Performed: attempted of THROMBECTOMY OF ARTERIOVENTOUS FISTULA LEFT ARM (Left Arm Lower) INSERTION OF LEFT UPPER ARM brachial to axillary artery ARTERIOVENOUS (AV) GORE-TEX GRAFT using 4-7 stretch graft (Left Arm Upper)  Patient Location: PACU  Anesthesia Type:General  Level of Consciousness: awake, alert , oriented and patient cooperative  Airway & Oxygen Therapy: Patient Spontanous Breathing and Patient connected to nasal cannula oxygen  Post-op Assessment: Report given to RN, Post -op Vital signs reviewed and stable and Patient moving all extremities X 4  Post vital signs: Reviewed and stable  Last Vitals:  Vitals Value Taken Time  BP 106/69 10/10/2018 12:23 PM  Temp    Pulse 100 10/10/2018 12:28 PM  Resp 19 10/10/2018 12:28 PM  SpO2 98 % 10/10/2018 12:28 PM  Vitals shown include unvalidated device data.  Last Pain:  Vitals:   10/10/18 0904  TempSrc:   PainSc: 4       Patients Stated Pain Goal: 4 (48/18/56 3149)  Complications: No apparent anesthesia complications

## 2018-10-10 NOTE — Discharge Instructions (Signed)
° °  Vascular and Vein Specialists of [place] ° °Discharge Instructions ° °AV Fistula or Graft Surgery for Dialysis Access ° °Please refer to the following instructions for your post-procedure care. Your surgeon or physician assistant will discuss any changes with you. ° °Activity ° °You may drive the day following your surgery, if you are comfortable and no longer taking prescription pain medication. Resume full activity as the soreness in your incision resolves. ° °Bathing/Showering ° °You may shower after you go home. Keep your incision dry for 48 hours. Do not soak in a bathtub, hot tub, or swim until the incision heals completely. You may not shower if you have a hemodialysis catheter. ° °Incision Care ° °Clean your incision with mild soap and water after 48 hours. Pat the area dry with a clean towel. You do not need a bandage unless otherwise instructed. Do not apply any ointments or creams to your incision. You may have skin glue on your incision. Do not peel it off. It will come off on its own in about one week. Your arm may swell a bit after surgery. To reduce swelling use pillows to elevate your arm so it is above your heart. Your doctor will tell you if you need to lightly wrap your arm with an ACE bandage. ° °Diet ° °Resume your normal diet. There are not special food restrictions following this procedure. In order to heal from your surgery, it is CRITICAL to get adequate nutrition. Your body requires vitamins, minerals, and protein. Vegetables are the best source of vitamins and minerals. Vegetables also provide the perfect balance of protein. Processed food has little nutritional value, so try to avoid this. ° °Medications ° °Resume taking all of your medications. If your incision is causing pain, you may take over-the counter pain relievers such as acetaminophen (Tylenol). If you were prescribed a stronger pain medication, please be aware these medications can cause nausea and constipation. Prevent  nausea by taking the medication with a snack or meal. Avoid constipation by drinking plenty of fluids and eating foods with high amount of fiber, such as fruits, vegetables, and grains. Do not take Tylenol if you are taking prescription pain medications. ° ° ° ° °Follow up °Your surgeon may want to see you in the office following your access surgery. If so, this will be arranged at the time of your surgery. ° °Please call us immediately for any of the following conditions: ° °Increased pain, redness, drainage (pus) from your incision site °Fever of 101 degrees or higher °Severe or worsening pain at your incision site °Hand pain or numbness. ° °Reduce your risk of vascular disease: ° °Stop smoking. If you would like help, call [facility] at 1-800-QUIT-NOW (1-800-784-8669) or [facility] at 336-586-4000 ° °Manage your cholesterol °Maintain a desired weight °Control your diabetes °Keep your blood pressure down ° °Dialysis ° °It will take several weeks to several months for your new dialysis access to be ready for use. Your surgeon will determine when it is OK to use it. Your nephrologist will continue to direct your dialysis. You can continue to use your Permcath until your new access is ready for use. ° °If you have any questions, please call the office at [phone]. ° °

## 2018-10-10 NOTE — Telephone Encounter (Signed)
She can refill it once now

## 2018-10-10 NOTE — H&P (Signed)
   History and Physical Update  The patient was interviewed and re-examined.  The patient's previous History and Physical has been reviewed and is unchanged from recent office visit. Plan for left arm avf revision vs most likely graft placement.   Brandon C. Donzetta Matters, MD Vascular and Vein Specialists of [place] Office: [phone] Pager: [phone]   10/10/2018, 9:24 AM

## 2018-10-10 NOTE — Anesthesia Procedure Notes (Signed)
Procedure Name: Intubation Date/Time: 10/10/2018 10:20 AM Performed by: Carney Living, CRNA Pre-anesthesia Checklist: Patient identified, Emergency Drugs available, Suction available, Patient being monitored and Timeout performed Patient Re-evaluated:Patient Re-evaluated prior to induction Oxygen Delivery Method: Circle system utilized Preoxygenation: Pre-oxygenation with 100% oxygen Induction Type: IV induction Ventilation: Mask ventilation without difficulty and Oral airway inserted - appropriate to patient size Laryngoscope Size: Mac and 4 Grade View: Grade I Tube type: Oral Tube size: 7.5 mm Number of attempts: 1 Airway Equipment and Method: Stylet Placement Confirmation: ETT inserted through vocal cords under direct vision,  positive ETCO2 and breath sounds checked- equal and bilateral Secured at: 23 cm Tube secured with: Tape Dental Injury: Teeth and Oropharynx as per pre-operative assessment

## 2018-10-10 NOTE — Op Note (Signed)
Patient name: [name] MRN: [id number] DOB: 08-03-1961 Sex: female  10/10/2018 Pre-operative Diagnosis: End-stage renal disease, thrombosed left upper extremity AV fistula Post-operative diagnosis:  Same Surgeon:  Eda Paschal. Donzetta Matters, MD Assistant: Laurence Slate, PA Procedure Performed: 1.  Attempted left upper extremity fistula thrombectomy 2.  Placement of left brachial artery to axillary vein arteriovenous graft with 4-7 mm stretch PTFE  Indications: 57 year old female on dialysis via left upper arm AV fistula that recently thrombosed.  She now has a catheter in place.  She is indicated for fistula thrombectomy with possible placement of graft.  She has severe neuropathy of her right hand so we are staying with the left arm.  Findings: Fistula was thrombosed and I could not get a Fogarty catheter to pass.  We are able to use the previous anastomotic site in the artery and the axillary vein was of adequate size.  I completion there was a thrill in the graft and there were radial and ulnar signals at the wrist that did augment with compression of the graft.   Procedure:  The patient was identified in the holding area and taken to the operating room where she was placed supine on the operative table and general anesthesia was induced.  She was sterilely prepped and draped in the left upper extremity in usual fashion, antibiotics were administered, a timeout was called.  We began with longitudinal incision at the site of her previous incision just above the antecubitum.  We dissected down placed Vesseloops around the artery proximally and distally as well as the notably thrombosed fistula.  5000 units of heparin was administered.  We then clamped the artery distally and proximally and removed the fistula at the previous anastomotic site.  There is some fibrinous exudate there that was excised as well.  We then attempted to pass Fogarty on 3 separate instances with one balloon popping and nothing  being able to pass despite external manipulation.  With this we placed a right angle on the fistula tied off with 2-0 silk suture.  A longitudinal incision was then made in the axilla we dissected down identified a large basilic vein and then deeper we found axillary vein we marked this for orientation.  One large branch was clipped.  We divided all the branches between ties.  We then tunneled a 4-7 mm PTFE stretch graft.  In the axilla we beveled the graft.  This was then sewn end-to-end to the vein with 5-0 Prolene suture.  After completing this anastomosis we flushed through the graft with heparinized saline and the graft was reclamped.  We had previously flush the artery proximally distally with heparinized saline we repeated this step.  The graft was trimmed to size since the antecubitum and sewn end to side on the 4 mm side with 6-0 Prolene suture.  At this time prior to completion we allowed flushing all directions.  Upon completion there was a thrill within the graft confirmed with Doppler in the runoff vein.  There were signals at the radial and ulnar arteries at the wrist did augment with compression of the graft.  Satisfied with this we irrigated both wounds.  They were both closed in layers with Vicryl and Monocryl.  Dermabond was placed to level the skin.  She was then allowed away from anesthesia having tolerated procedure without immediate complication.  All counts were correct at completion  EBL: 50 cc.   Venera Privott C. Donzetta Matters, MD [facility] Ve[facility] Specialists of [place] Office: [phone]  Pager: [phone]

## 2018-10-10 NOTE — Anesthesia Preprocedure Evaluation (Addendum)
Anesthesia Evaluation  Patient identified by MRN, date of birth, ID band Patient awake    Reviewed: Allergy & Precautions, NPO status , Patient's Chart, lab work & pertinent test results  History of Anesthesia Complications Negative for: history of anesthetic complications  Airway Mallampati: II  TM Distance: >3 FB Neck ROM: Full    Dental  (+) Dental Advisory Given, Partial Upper, Edentulous Upper   Pulmonary sleep apnea and Oxygen sleep apnea , COPD, former smoker,    breath sounds clear to auscultation       Cardiovascular hypertension, Pt. on medications + dysrhythmias Atrial Fibrillation  Rhythm:Regular Rate:Normal   EKG - SR with sinus arrhythmia, QTc 480  '17 TTE - Mild LVH. EF 60% to 65%. Trivial PR, mild TR.    Neuro/Psych PSYCHIATRIC DISORDERS Depression  Hard of hearing Neuropathic pain     GI/Hepatic Neg liver ROS, GERD  Controlled and Medicated,  Endo/Other  diabetes, Type 2, Oral Hypoglycemic AgentsHypothyroidism Morbid obesity  Renal/GU ESRF and DialysisRenal disease     Musculoskeletal negative musculoskeletal ROS (+)   Abdominal (+) + obese,   Peds  Hematology  (+) anemia ,   Anesthesia Other Findings   Reproductive/Obstetrics                           Anesthesia Physical Anesthesia Plan  ASA: IV  Anesthesia Plan: General   Post-op Pain Management:    Induction: Intravenous  PONV Risk Score and Plan: 3 and Treatment may vary due to age or medical condition, Ondansetron, Dexamethasone and Midazolam  Airway Management Planned: Oral ETT  Additional Equipment: None  Intra-op Plan:   Post-operative Plan: Extubation in OR  Informed Consent: I have reviewed the patients History and Physical, chart, labs and discussed the procedure including the risks, benefits and alternatives for the proposed anesthesia with the patient or authorized representative who has  indicated his/her understanding and acceptance.   Dental advisory given  Plan Discussed with: CRNA, Anesthesiologist and Surgeon  Anesthesia Plan Comments:       Anesthesia Quick Evaluation

## 2018-10-11 ENCOUNTER — Encounter (HOSPITAL_COMMUNITY): Payer: Self-pay | Admitting: Vascular Surgery

## 2018-10-11 DIAGNOSIS — E1129 Type 2 diabetes mellitus with other diabetic kidney complication: Secondary | ICD-10-CM | POA: Diagnosis not present

## 2018-10-11 DIAGNOSIS — D509 Iron deficiency anemia, unspecified: Secondary | ICD-10-CM | POA: Diagnosis not present

## 2018-10-11 DIAGNOSIS — N186 End stage renal disease: Secondary | ICD-10-CM | POA: Diagnosis not present

## 2018-10-11 DIAGNOSIS — N2581 Secondary hyperparathyroidism of renal origin: Secondary | ICD-10-CM | POA: Diagnosis not present

## 2018-10-11 DIAGNOSIS — D631 Anemia in chronic kidney disease: Secondary | ICD-10-CM | POA: Diagnosis not present

## 2018-10-11 NOTE — Telephone Encounter (Signed)
Sent in refill. Thanks!

## 2018-10-14 DIAGNOSIS — D509 Iron deficiency anemia, unspecified: Secondary | ICD-10-CM | POA: Diagnosis not present

## 2018-10-14 DIAGNOSIS — E1129 Type 2 diabetes mellitus with other diabetic kidney complication: Secondary | ICD-10-CM | POA: Diagnosis not present

## 2018-10-14 DIAGNOSIS — N186 End stage renal disease: Secondary | ICD-10-CM | POA: Diagnosis not present

## 2018-10-14 DIAGNOSIS — N2581 Secondary hyperparathyroidism of renal origin: Secondary | ICD-10-CM | POA: Diagnosis not present

## 2018-10-14 DIAGNOSIS — D631 Anemia in chronic kidney disease: Secondary | ICD-10-CM | POA: Diagnosis not present

## 2018-10-16 DIAGNOSIS — E1129 Type 2 diabetes mellitus with other diabetic kidney complication: Secondary | ICD-10-CM | POA: Diagnosis not present

## 2018-10-16 DIAGNOSIS — N186 End stage renal disease: Secondary | ICD-10-CM | POA: Diagnosis not present

## 2018-10-16 DIAGNOSIS — D509 Iron deficiency anemia, unspecified: Secondary | ICD-10-CM | POA: Diagnosis not present

## 2018-10-16 DIAGNOSIS — N2581 Secondary hyperparathyroidism of renal origin: Secondary | ICD-10-CM | POA: Diagnosis not present

## 2018-10-16 DIAGNOSIS — D631 Anemia in chronic kidney disease: Secondary | ICD-10-CM | POA: Diagnosis not present

## 2018-10-18 DIAGNOSIS — D509 Iron deficiency anemia, unspecified: Secondary | ICD-10-CM | POA: Diagnosis not present

## 2018-10-18 DIAGNOSIS — E1129 Type 2 diabetes mellitus with other diabetic kidney complication: Secondary | ICD-10-CM | POA: Diagnosis not present

## 2018-10-18 DIAGNOSIS — N186 End stage renal disease: Secondary | ICD-10-CM | POA: Diagnosis not present

## 2018-10-18 DIAGNOSIS — N2581 Secondary hyperparathyroidism of renal origin: Secondary | ICD-10-CM | POA: Diagnosis not present

## 2018-10-18 DIAGNOSIS — D631 Anemia in chronic kidney disease: Secondary | ICD-10-CM | POA: Diagnosis not present

## 2018-10-20 DIAGNOSIS — N2581 Secondary hyperparathyroidism of renal origin: Secondary | ICD-10-CM | POA: Diagnosis not present

## 2018-10-20 DIAGNOSIS — E1129 Type 2 diabetes mellitus with other diabetic kidney complication: Secondary | ICD-10-CM | POA: Diagnosis not present

## 2018-10-20 DIAGNOSIS — D509 Iron deficiency anemia, unspecified: Secondary | ICD-10-CM | POA: Diagnosis not present

## 2018-10-20 DIAGNOSIS — N186 End stage renal disease: Secondary | ICD-10-CM | POA: Diagnosis not present

## 2018-10-20 DIAGNOSIS — D631 Anemia in chronic kidney disease: Secondary | ICD-10-CM | POA: Diagnosis not present

## 2018-10-22 DIAGNOSIS — E1129 Type 2 diabetes mellitus with other diabetic kidney complication: Secondary | ICD-10-CM | POA: Diagnosis not present

## 2018-10-22 DIAGNOSIS — D631 Anemia in chronic kidney disease: Secondary | ICD-10-CM | POA: Diagnosis not present

## 2018-10-22 DIAGNOSIS — N186 End stage renal disease: Secondary | ICD-10-CM | POA: Diagnosis not present

## 2018-10-22 DIAGNOSIS — N2581 Secondary hyperparathyroidism of renal origin: Secondary | ICD-10-CM | POA: Diagnosis not present

## 2018-10-22 DIAGNOSIS — D509 Iron deficiency anemia, unspecified: Secondary | ICD-10-CM | POA: Diagnosis not present

## 2018-10-25 DIAGNOSIS — D631 Anemia in chronic kidney disease: Secondary | ICD-10-CM | POA: Diagnosis not present

## 2018-10-25 DIAGNOSIS — D509 Iron deficiency anemia, unspecified: Secondary | ICD-10-CM | POA: Diagnosis not present

## 2018-10-25 DIAGNOSIS — E1129 Type 2 diabetes mellitus with other diabetic kidney complication: Secondary | ICD-10-CM | POA: Diagnosis not present

## 2018-10-25 DIAGNOSIS — N2581 Secondary hyperparathyroidism of renal origin: Secondary | ICD-10-CM | POA: Diagnosis not present

## 2018-10-25 DIAGNOSIS — N186 End stage renal disease: Secondary | ICD-10-CM | POA: Diagnosis not present

## 2018-10-27 DIAGNOSIS — E1129 Type 2 diabetes mellitus with other diabetic kidney complication: Secondary | ICD-10-CM | POA: Diagnosis not present

## 2018-10-27 DIAGNOSIS — N186 End stage renal disease: Secondary | ICD-10-CM | POA: Diagnosis not present

## 2018-10-27 DIAGNOSIS — Z992 Dependence on renal dialysis: Secondary | ICD-10-CM | POA: Diagnosis not present

## 2018-10-28 DIAGNOSIS — N186 End stage renal disease: Secondary | ICD-10-CM | POA: Diagnosis not present

## 2018-10-28 DIAGNOSIS — E1129 Type 2 diabetes mellitus with other diabetic kidney complication: Secondary | ICD-10-CM | POA: Diagnosis not present

## 2018-10-28 DIAGNOSIS — D509 Iron deficiency anemia, unspecified: Secondary | ICD-10-CM | POA: Diagnosis not present

## 2018-10-28 DIAGNOSIS — N2581 Secondary hyperparathyroidism of renal origin: Secondary | ICD-10-CM | POA: Diagnosis not present

## 2018-10-28 DIAGNOSIS — D631 Anemia in chronic kidney disease: Secondary | ICD-10-CM | POA: Diagnosis not present

## 2018-10-30 DIAGNOSIS — N2581 Secondary hyperparathyroidism of renal origin: Secondary | ICD-10-CM | POA: Diagnosis not present

## 2018-10-30 DIAGNOSIS — N186 End stage renal disease: Secondary | ICD-10-CM | POA: Diagnosis not present

## 2018-10-30 DIAGNOSIS — E1129 Type 2 diabetes mellitus with other diabetic kidney complication: Secondary | ICD-10-CM | POA: Diagnosis not present

## 2018-10-30 DIAGNOSIS — D631 Anemia in chronic kidney disease: Secondary | ICD-10-CM | POA: Diagnosis not present

## 2018-10-30 DIAGNOSIS — D509 Iron deficiency anemia, unspecified: Secondary | ICD-10-CM | POA: Diagnosis not present

## 2018-11-01 DIAGNOSIS — E1129 Type 2 diabetes mellitus with other diabetic kidney complication: Secondary | ICD-10-CM | POA: Diagnosis not present

## 2018-11-01 DIAGNOSIS — N186 End stage renal disease: Secondary | ICD-10-CM | POA: Diagnosis not present

## 2018-11-01 DIAGNOSIS — N2581 Secondary hyperparathyroidism of renal origin: Secondary | ICD-10-CM | POA: Diagnosis not present

## 2018-11-01 DIAGNOSIS — D631 Anemia in chronic kidney disease: Secondary | ICD-10-CM | POA: Diagnosis not present

## 2018-11-01 DIAGNOSIS — D509 Iron deficiency anemia, unspecified: Secondary | ICD-10-CM | POA: Diagnosis not present

## 2018-11-04 DIAGNOSIS — N2581 Secondary hyperparathyroidism of renal origin: Secondary | ICD-10-CM | POA: Diagnosis not present

## 2018-11-04 DIAGNOSIS — D509 Iron deficiency anemia, unspecified: Secondary | ICD-10-CM | POA: Diagnosis not present

## 2018-11-04 DIAGNOSIS — E1129 Type 2 diabetes mellitus with other diabetic kidney complication: Secondary | ICD-10-CM | POA: Diagnosis not present

## 2018-11-04 DIAGNOSIS — D631 Anemia in chronic kidney disease: Secondary | ICD-10-CM | POA: Diagnosis not present

## 2018-11-04 DIAGNOSIS — N186 End stage renal disease: Secondary | ICD-10-CM | POA: Diagnosis not present

## 2018-11-05 ENCOUNTER — Telehealth: Payer: Self-pay | Admitting: *Deleted

## 2018-11-05 DIAGNOSIS — L732 Hidradenitis suppurativa: Secondary | ICD-10-CM

## 2018-11-05 MED ORDER — AMOXICILLIN-POT CLAVULANATE 500-125 MG PO TABS: 1.0000 | ORAL_TABLET | Freq: Two times a day (BID) | ORAL | 0 refills | Status: DC

## 2018-11-05 NOTE — Addendum Note (Signed)
Addended by: Reggy Eye on: 11/05/2018 05:10 PM   Modules accepted: Orders

## 2018-11-05 NOTE — Telephone Encounter (Signed)
Called patient mother to advise of medication change and it was sent to the pharmacy and she did not answer the line.

## 2018-11-05 NOTE — Telephone Encounter (Signed)
Patient mother called to ask for refills of the Cipro and Doxy she uses for Hidradenitis. She advised the patient is bleeding from the areas and there is a smell also. Advised will have to send the provider a message to see what he would like to do as she is a dialysis patient. Advised will call back once he responds.

## 2018-11-05 NOTE — Telephone Encounter (Signed)
Why dont we try amoxiciilin 500/125 clavunic acid (augmentin) q 12 hours x 20 days

## 2018-11-06 DIAGNOSIS — N186 End stage renal disease: Secondary | ICD-10-CM | POA: Diagnosis not present

## 2018-11-06 DIAGNOSIS — E1129 Type 2 diabetes mellitus with other diabetic kidney complication: Secondary | ICD-10-CM | POA: Diagnosis not present

## 2018-11-06 DIAGNOSIS — D509 Iron deficiency anemia, unspecified: Secondary | ICD-10-CM | POA: Diagnosis not present

## 2018-11-06 DIAGNOSIS — N2581 Secondary hyperparathyroidism of renal origin: Secondary | ICD-10-CM | POA: Diagnosis not present

## 2018-11-06 DIAGNOSIS — D631 Anemia in chronic kidney disease: Secondary | ICD-10-CM | POA: Diagnosis not present

## 2018-11-08 DIAGNOSIS — E1129 Type 2 diabetes mellitus with other diabetic kidney complication: Secondary | ICD-10-CM | POA: Diagnosis not present

## 2018-11-08 DIAGNOSIS — N186 End stage renal disease: Secondary | ICD-10-CM | POA: Diagnosis not present

## 2018-11-08 DIAGNOSIS — D509 Iron deficiency anemia, unspecified: Secondary | ICD-10-CM | POA: Diagnosis not present

## 2018-11-08 DIAGNOSIS — N2581 Secondary hyperparathyroidism of renal origin: Secondary | ICD-10-CM | POA: Diagnosis not present

## 2018-11-08 DIAGNOSIS — D631 Anemia in chronic kidney disease: Secondary | ICD-10-CM | POA: Diagnosis not present

## 2018-11-11 DIAGNOSIS — N186 End stage renal disease: Secondary | ICD-10-CM | POA: Diagnosis not present

## 2018-11-11 DIAGNOSIS — D509 Iron deficiency anemia, unspecified: Secondary | ICD-10-CM | POA: Diagnosis not present

## 2018-11-11 DIAGNOSIS — D631 Anemia in chronic kidney disease: Secondary | ICD-10-CM | POA: Diagnosis not present

## 2018-11-11 DIAGNOSIS — N2581 Secondary hyperparathyroidism of renal origin: Secondary | ICD-10-CM | POA: Diagnosis not present

## 2018-11-11 DIAGNOSIS — E1129 Type 2 diabetes mellitus with other diabetic kidney complication: Secondary | ICD-10-CM | POA: Diagnosis not present

## 2018-11-13 DIAGNOSIS — D631 Anemia in chronic kidney disease: Secondary | ICD-10-CM | POA: Diagnosis not present

## 2018-11-13 DIAGNOSIS — N2581 Secondary hyperparathyroidism of renal origin: Secondary | ICD-10-CM | POA: Diagnosis not present

## 2018-11-13 DIAGNOSIS — E1129 Type 2 diabetes mellitus with other diabetic kidney complication: Secondary | ICD-10-CM | POA: Diagnosis not present

## 2018-11-13 DIAGNOSIS — N186 End stage renal disease: Secondary | ICD-10-CM | POA: Diagnosis not present

## 2018-11-13 DIAGNOSIS — D509 Iron deficiency anemia, unspecified: Secondary | ICD-10-CM | POA: Diagnosis not present

## 2018-11-15 DIAGNOSIS — D509 Iron deficiency anemia, unspecified: Secondary | ICD-10-CM | POA: Diagnosis not present

## 2018-11-15 DIAGNOSIS — N186 End stage renal disease: Secondary | ICD-10-CM | POA: Diagnosis not present

## 2018-11-15 DIAGNOSIS — E1129 Type 2 diabetes mellitus with other diabetic kidney complication: Secondary | ICD-10-CM | POA: Diagnosis not present

## 2018-11-15 DIAGNOSIS — N2581 Secondary hyperparathyroidism of renal origin: Secondary | ICD-10-CM | POA: Diagnosis not present

## 2018-11-15 DIAGNOSIS — D631 Anemia in chronic kidney disease: Secondary | ICD-10-CM | POA: Diagnosis not present

## 2018-11-17 DIAGNOSIS — D631 Anemia in chronic kidney disease: Secondary | ICD-10-CM | POA: Diagnosis not present

## 2018-11-17 DIAGNOSIS — E1129 Type 2 diabetes mellitus with other diabetic kidney complication: Secondary | ICD-10-CM | POA: Diagnosis not present

## 2018-11-17 DIAGNOSIS — N2581 Secondary hyperparathyroidism of renal origin: Secondary | ICD-10-CM | POA: Diagnosis not present

## 2018-11-17 DIAGNOSIS — D509 Iron deficiency anemia, unspecified: Secondary | ICD-10-CM | POA: Diagnosis not present

## 2018-11-17 DIAGNOSIS — N186 End stage renal disease: Secondary | ICD-10-CM | POA: Diagnosis not present

## 2018-11-19 DIAGNOSIS — N186 End stage renal disease: Secondary | ICD-10-CM | POA: Diagnosis not present

## 2018-11-19 DIAGNOSIS — D631 Anemia in chronic kidney disease: Secondary | ICD-10-CM | POA: Diagnosis not present

## 2018-11-19 DIAGNOSIS — E1129 Type 2 diabetes mellitus with other diabetic kidney complication: Secondary | ICD-10-CM | POA: Diagnosis not present

## 2018-11-19 DIAGNOSIS — D509 Iron deficiency anemia, unspecified: Secondary | ICD-10-CM | POA: Diagnosis not present

## 2018-11-19 DIAGNOSIS — N2581 Secondary hyperparathyroidism of renal origin: Secondary | ICD-10-CM | POA: Diagnosis not present

## 2018-11-22 DIAGNOSIS — E1129 Type 2 diabetes mellitus with other diabetic kidney complication: Secondary | ICD-10-CM | POA: Diagnosis not present

## 2018-11-22 DIAGNOSIS — D631 Anemia in chronic kidney disease: Secondary | ICD-10-CM | POA: Diagnosis not present

## 2018-11-22 DIAGNOSIS — N186 End stage renal disease: Secondary | ICD-10-CM | POA: Diagnosis not present

## 2018-11-22 DIAGNOSIS — D509 Iron deficiency anemia, unspecified: Secondary | ICD-10-CM | POA: Diagnosis not present

## 2018-11-22 DIAGNOSIS — N2581 Secondary hyperparathyroidism of renal origin: Secondary | ICD-10-CM | POA: Diagnosis not present

## 2018-11-24 DIAGNOSIS — D631 Anemia in chronic kidney disease: Secondary | ICD-10-CM | POA: Diagnosis not present

## 2018-11-24 DIAGNOSIS — D509 Iron deficiency anemia, unspecified: Secondary | ICD-10-CM | POA: Diagnosis not present

## 2018-11-24 DIAGNOSIS — N186 End stage renal disease: Secondary | ICD-10-CM | POA: Diagnosis not present

## 2018-11-24 DIAGNOSIS — N2581 Secondary hyperparathyroidism of renal origin: Secondary | ICD-10-CM | POA: Diagnosis not present

## 2018-11-24 DIAGNOSIS — E1129 Type 2 diabetes mellitus with other diabetic kidney complication: Secondary | ICD-10-CM | POA: Diagnosis not present

## 2018-11-25 ENCOUNTER — Encounter: Payer: Self-pay | Admitting: Physician Assistant

## 2018-11-25 NOTE — Progress Notes (Deleted)
Cardiology Office Note    Date:  11/25/2018  ID:  [name], DOB 1960/12/07, MRN [id number] PCP:  Harlan Stains, MD  Cardiologist:  Fransico Him, MD   Chief Complaint: f/u afib/flutter, CHF  History of Present Illness:  [name] is a 57 y.o. female with history of ESRD on dilaysis, dilated cardiomyopathy (now with normalized EF at 50-55%), chronic diastolic CHF, PVCs, paroxysmal atrial fibrillation, DM, morbid obesity, chronic hypotension, chronic shortness of breath with COPD with 02 at night, OSA, hydradenitis suppurativa. She presents for 6 month follow-up.  Regarding prior cardiac history, she has a history of presumed NICM - remote echo 2011 showed EF 35-40% and could not exclude apical thrombus. In 2013 she was admitted with CHF in the setting of noncompliance with medications/dietary recommendations at that time. She eventually required initiation of HD. In 2014 her EF normalized. In 2017 she developed increased SOB so stress test was ordered but patient did not have this done. Last echo 08/2016 did show EF 60-65%, mild LVH otherwise no acute findings. In 2018 she developed paroxysmal atrial fib and flutter and was seen by the afib clinic. She is not anticoagulated due to open skin lesion secondary to hidradenitis on her groin and buttocks which cause constant bleeding and oozing. Medication options have otherwise been limited given her hypotension at dialysis. Last labs 09/2018 with Na 134, K 4, Hgb 12.9.  CPAP?   Chronic diastolic CHF H/o NICM Paroxysmal atrial fib/Paroxysmal atrial flutter OSA    Past Medical History:  Diagnosis Date  . Chronic diastolic CHF (congestive heart failure) ([place])   . DCM (dilated cardiomyopathy) ([place])    a. presumed nonischemic - no prior cath/nuc. EF 35-40% in 2011, most recently  60-65% by echo 2017.  Marland Kitchen Depression   . DM type 2 (diabetes mellitus, type 2) ([facility])    diet controlledwith retinopathy and nephropathy  . ESRD on  hemodialysis ([place]) 08/21/2017  . GERD (gastroesophageal reflux disease)   . H/O cardiac arrest 10/2012  . Hard of hearing 10/2012   now wears 2 hearing aids  . History of hemodialysis dec 2013  . HTN (hypertension)   . Hydradenitis   . Hyperkalemia   . Hyperlipidemia   . Hypothyroidism   . Iron deficiency anemia   . Morbid obesity ([place])   . Multinodular goiter   . Neuropathic pain 08/21/2017  . Orthostatic hypotension 06/27/2018  . PAF (paroxysmal atrial fibrillation) ([place])   . Paroxysmal atrial flutter ([place])   . Pneumonia Nov 10, 2012  . Sleep apnea    Intolerant to CPAP  . Tinnitus 12/26/2016  . Typical atrial flutter ([place]) 04/10/2017  . Urinary tract infection    taking antibiotics for 3 days prior to surgery    Past Surgical History:  Procedure Laterality Date  . AV FISTULA PLACEMENT Left 07/25/2013   Procedure: ARTERIOVENOUS (AV) FISTULA CREATION ;  Surgeon: Rosetta Posner, MD;  Location: [facility];  Service: Vascular;  Laterality: Left;  . AV FISTULA PLACEMENT Left 10/10/2018   Procedure: INSERTION OF LEFT UPPER ARM brachial to axillary artery ARTERIOVENOUS (AV) GORE-TEX GRAFT using 4-7 stretch graft;  Surgeon: Waynetta Sandy, MD;  Location: [facility];  Service: Vascular;  Laterality: Left;  . CYSTOSCOPY W/ RETROGRADES  11/16/2012   Procedure: CYSTOSCOPY WITH RETROGRADE PYELOGRAM;  Surgeon: Reece Packer, MD;  Location: [facility];  Service: Urology;  Laterality: Bilateral;  CYSTOSCOPY,BILATERAL RETROGRADE PYELOGRAM/ REMOVAL LEFT URETERAL STENT/ FULGERATION BLADDER MUCOSA/ INSERTION RIGHT URETERAL  STENT  . CYSTOSCOPY W/ URETERAL STENT PLACEMENT  05/28/2012   Procedure: CYSTOSCOPY WITH RETROGRADE PYELOGRAM/URETERAL STENT PLACEMENT;  Surgeon: Reece Packer, MD;  Location: [facility];  Service: Urology;  Laterality: Left;  . CYSTOSCOPY WITH RETROGRADE PYELOGRAM, URETEROSCOPY AND STENT PLACEMENT Right 06/23/2013   Procedure: CYSTOSCOPY WITH RIGHT URETEROSCOPY, RIGHT  RETROGRADE  PYELOGRAM, WITH LASER LIPOTRIPSY AND RIGHT URETERAL STENT EXCHANGE ;  Surgeon: Molli Hazard, MD;  Location: [facility];  Service: Urology;  Laterality: Right;  STENT EXCHANGE    . CYSTOSCOPY WITH RETROGRADE PYELOGRAM, URETEROSCOPY AND STENT PLACEMENT Right 11/06/2013   Procedure: CYSTOSCOPY WITH RETROGRADE PYELOGRAM, URETEROSCOPY STONE REMOVAL AND STENT REMOVAL;  Surgeon: Molli Hazard, MD;  Location: [facility];  Service: Urology;  Laterality: Right;  . HOLMIUM LASER APPLICATION N/A 06/08/4579   Procedure: HOLMIUM LASER APPLICATION;  Surgeon: Molli Hazard, MD;  Location: [facility];  Service: Urology;  Laterality: N/A;  . INSERTION OF DIALYSIS CATHETER N/A 09/24/2013   Procedure: INSERTION OF DIALYSIS CATHETER;  Surgeon: Rosetta Posner, MD;  [facility];  Service: Vascular;  Laterality: N/A;  . IR AV DIALY SHUNT INTRO NEEDLE/INTRAC INITIAL W/PTA/STENT/IMG LT Left 08/13/2018  . IR AV DIALY SHUNT INTRO NEEDLE/INTRACATH INITIAL W/PTA/IMG LEFT  04/19/2018  . IR AV DIALY SHUNT INTRO NEEDLE/INTRACATH INITIAL W/PTA/IMG LEFT  08/14/2018  . IR FLUORO GUIDE CV LINE RIGHT  09/17/2018  . IR FLUORO GUIDE CV LINE RIGHT  09/20/2018  . IR US GUIDE VASC ACCESS LEFT  04/19/2018  . IR US GUIDE VASC ACCESS LEFT  08/13/2018  . IR US GUIDE VASC ACCESS LEFT  08/14/2018  . IR US GUIDE VASC ACCESS RIGHT  09/17/2018  . PORT-A-CATH REMOVAL    . PORTACATH PLACEMENT    . REVISON OF ARTERIOVENOUS FISTULA Left 07/25/2016   Procedure: REVISON OF LEFT ARTERIOVENOUS FISTULA;  Surgeon: Elam Dutch, MD;  Location: [facility];  Service: Vascular;  Laterality: Left;  . THROMBECTOMY AND REVISION OF ARTERIOVENTOUS (AV) GORETEX  GRAFT Left 10/10/2018   Procedure: attempted of THROMBECTOMY OF ARTERIOVENTOUS FISTULA LEFT ARM;  Surgeon: Waynetta Sandy, MD;  Location: [facility];  Service: Vascular;  Laterality: Left;    Current Medications: No outpatient medications have been marked as taking for the 11/28/18 encounter  (Appointment) with Charlie Pitter, PA-C.   ***   Allergies:   Rosiglitazone; Amoxicillin; and Amoxicillin-pot clavulanate   Social History   Socioeconomic History  . Marital status: Married    Spouse name: Not on file  . Number of children: Not on file  . Years of education: Not on file  . Highest education level: Not on file  Occupational History  . Not on file  Social Needs  . Financial resource strain: Not on file  . Food insecurity:    Worry: Not on file    Inability: Not on file  . Transportation needs:    Medical: Not on file    Non-medical: Not on file  Tobacco Use  . Smoking status: Former Smoker    Packs/day: 0.25    Years: 1.00    Pack years: 0.25    Types: Cigarettes    Last attempt to quit: 11/28/1979    Years since quitting: 39.0  . Smokeless tobacco: Never Used  Substance and Sexual Activity  . Alcohol use: No  . Drug use: No  . Sexual activity: Never    Birth control/protection: Abstinence  Lifestyle  . Physical activity:    Days per week: Not  on file    Minutes per session: Not on file  . Stress: Not on file  Relationships  . Social connections:    Talks on phone: Not on file    Gets together: Not on file    Attends religious service: Not on file    Active member of club or organization: Not on file    Attends meetings of clubs or organizations: Not on file    Relationship status: Not on file  Other Topics Concern  . Not on file  Social History Narrative  . Not on file     Family History:  The patient's ***family history includes Cancer in her maternal grandfather; Coronary artery disease in her father and mother; Diabetes type II in her mother; Hypertension in her father and mother; Kidney failure in her maternal grandmother; Malignant hyperthermia in her father.  ROS:   Please see the history of present illness. Otherwise, review of systems is positive for ***.  All other systems are reviewed and otherwise negative.    PHYSICAL EXAM:     VS:  LMP 06/13/2013   BMI: There is no height or weight on file to calculate BMI. GEN: Well nourished, well developed, in no acute distress HEENT: normocephalic, atraumatic Neck: no JVD, carotid bruits, or masses Cardiac: ***RRR; no murmurs, rubs, or gallops, no edema  Respiratory:  clear to auscultation bilaterally, normal work of breathing GI: soft, nontender, nondistended, + BS MS: no deformity or atrophy Skin: warm and dry, no rash Neuro:  Alert and Oriented x 3, Strength and sensation are intact, follows commands Psych: euthymic mood, full affect  Wt Readings from Last 3 Encounters:  10/10/18 (!) 395 lb 8.1 oz (179.4 kg)  10/01/18 (!) 394 lb 10 oz (179 kg)  09/20/18 (!) 342 lb 6.4 oz (155.3 kg)      Studies/Labs Reviewed:   EKG:  EKG was ordered today and personally reviewed by me and demonstrates *** EKG was not ordered today.***  Recent Labs: 09/18/2018: ALT 11 09/20/2018: BUN 52; Creatinine, Ser 10.17; Platelets 338 10/10/2018: Hemoglobin 12.9; Potassium 4.0; Sodium 134   Lipid Panel    Component Value Date/Time   CHOL 80 01/20/2012 1639   TRIG 55 01/20/2012 1639   HDL 28 (L) 01/20/2012 1639   CHOLHDL 2.9 01/20/2012 1639   VLDL 11 01/20/2012 1639   LDLCALC 41 01/20/2012 1639    Additional studies/ records that were reviewed today include: Summarized above.***    ASSESSMENT & PLAN:   1. ***  Disposition: F/u with ***   Medication Adjustments/Labs and Tests Ordered: Current medicines are reviewed at length with the patient today.  Concerns regarding medicines are outlined above. Medication changes, Labs and Tests ordered today are summarized above and listed in the Patient Instructions accessible in Encounters.   Signed, Charlie Pitter, PA-C  11/25/2018 11:55 AM    [address], [place], [place]  [id number] Phone: [phone]; Fax: [phone]

## 2018-11-26 DIAGNOSIS — N2581 Secondary hyperparathyroidism of renal origin: Secondary | ICD-10-CM | POA: Diagnosis not present

## 2018-11-26 DIAGNOSIS — D631 Anemia in chronic kidney disease: Secondary | ICD-10-CM | POA: Diagnosis not present

## 2018-11-26 DIAGNOSIS — N186 End stage renal disease: Secondary | ICD-10-CM | POA: Diagnosis not present

## 2018-11-26 DIAGNOSIS — D509 Iron deficiency anemia, unspecified: Secondary | ICD-10-CM | POA: Diagnosis not present

## 2018-11-26 DIAGNOSIS — E1129 Type 2 diabetes mellitus with other diabetic kidney complication: Secondary | ICD-10-CM | POA: Diagnosis not present

## 2018-11-27 DIAGNOSIS — E1129 Type 2 diabetes mellitus with other diabetic kidney complication: Secondary | ICD-10-CM | POA: Diagnosis not present

## 2018-11-27 DIAGNOSIS — N186 End stage renal disease: Secondary | ICD-10-CM | POA: Diagnosis not present

## 2018-11-27 DIAGNOSIS — Z992 Dependence on renal dialysis: Secondary | ICD-10-CM | POA: Diagnosis not present

## 2018-11-28 ENCOUNTER — Ambulatory Visit: Payer: Medicare Other | Admitting: Physician Assistant

## 2018-11-29 DIAGNOSIS — D631 Anemia in chronic kidney disease: Secondary | ICD-10-CM | POA: Diagnosis not present

## 2018-11-29 DIAGNOSIS — N186 End stage renal disease: Secondary | ICD-10-CM | POA: Diagnosis not present

## 2018-11-29 DIAGNOSIS — D509 Iron deficiency anemia, unspecified: Secondary | ICD-10-CM | POA: Diagnosis not present

## 2018-11-29 DIAGNOSIS — E1129 Type 2 diabetes mellitus with other diabetic kidney complication: Secondary | ICD-10-CM | POA: Diagnosis not present

## 2018-11-29 DIAGNOSIS — N2581 Secondary hyperparathyroidism of renal origin: Secondary | ICD-10-CM | POA: Diagnosis not present

## 2018-12-02 DIAGNOSIS — E1129 Type 2 diabetes mellitus with other diabetic kidney complication: Secondary | ICD-10-CM | POA: Diagnosis not present

## 2018-12-02 DIAGNOSIS — D509 Iron deficiency anemia, unspecified: Secondary | ICD-10-CM | POA: Diagnosis not present

## 2018-12-02 DIAGNOSIS — N186 End stage renal disease: Secondary | ICD-10-CM | POA: Diagnosis not present

## 2018-12-02 DIAGNOSIS — D631 Anemia in chronic kidney disease: Secondary | ICD-10-CM | POA: Diagnosis not present

## 2018-12-02 DIAGNOSIS — N2581 Secondary hyperparathyroidism of renal origin: Secondary | ICD-10-CM | POA: Diagnosis not present

## 2018-12-04 DIAGNOSIS — N186 End stage renal disease: Secondary | ICD-10-CM | POA: Diagnosis not present

## 2018-12-04 DIAGNOSIS — E1129 Type 2 diabetes mellitus with other diabetic kidney complication: Secondary | ICD-10-CM | POA: Diagnosis not present

## 2018-12-04 DIAGNOSIS — D631 Anemia in chronic kidney disease: Secondary | ICD-10-CM | POA: Diagnosis not present

## 2018-12-04 DIAGNOSIS — D509 Iron deficiency anemia, unspecified: Secondary | ICD-10-CM | POA: Diagnosis not present

## 2018-12-04 DIAGNOSIS — N2581 Secondary hyperparathyroidism of renal origin: Secondary | ICD-10-CM | POA: Diagnosis not present

## 2018-12-05 ENCOUNTER — Ambulatory Visit: Payer: Medicare Other | Admitting: Podiatry

## 2018-12-06 DIAGNOSIS — E1129 Type 2 diabetes mellitus with other diabetic kidney complication: Secondary | ICD-10-CM | POA: Diagnosis not present

## 2018-12-06 DIAGNOSIS — D631 Anemia in chronic kidney disease: Secondary | ICD-10-CM | POA: Diagnosis not present

## 2018-12-06 DIAGNOSIS — N2581 Secondary hyperparathyroidism of renal origin: Secondary | ICD-10-CM | POA: Diagnosis not present

## 2018-12-06 DIAGNOSIS — N186 End stage renal disease: Secondary | ICD-10-CM | POA: Diagnosis not present

## 2018-12-06 DIAGNOSIS — D509 Iron deficiency anemia, unspecified: Secondary | ICD-10-CM | POA: Diagnosis not present

## 2018-12-09 DIAGNOSIS — N186 End stage renal disease: Secondary | ICD-10-CM | POA: Diagnosis not present

## 2018-12-09 DIAGNOSIS — D509 Iron deficiency anemia, unspecified: Secondary | ICD-10-CM | POA: Diagnosis not present

## 2018-12-09 DIAGNOSIS — N2581 Secondary hyperparathyroidism of renal origin: Secondary | ICD-10-CM | POA: Diagnosis not present

## 2018-12-09 DIAGNOSIS — E1129 Type 2 diabetes mellitus with other diabetic kidney complication: Secondary | ICD-10-CM | POA: Diagnosis not present

## 2018-12-09 DIAGNOSIS — D631 Anemia in chronic kidney disease: Secondary | ICD-10-CM | POA: Diagnosis not present

## 2018-12-11 DIAGNOSIS — D631 Anemia in chronic kidney disease: Secondary | ICD-10-CM | POA: Diagnosis not present

## 2018-12-11 DIAGNOSIS — N186 End stage renal disease: Secondary | ICD-10-CM | POA: Diagnosis not present

## 2018-12-11 DIAGNOSIS — D509 Iron deficiency anemia, unspecified: Secondary | ICD-10-CM | POA: Diagnosis not present

## 2018-12-11 DIAGNOSIS — E1129 Type 2 diabetes mellitus with other diabetic kidney complication: Secondary | ICD-10-CM | POA: Diagnosis not present

## 2018-12-11 DIAGNOSIS — N2581 Secondary hyperparathyroidism of renal origin: Secondary | ICD-10-CM | POA: Diagnosis not present

## 2018-12-13 DIAGNOSIS — N186 End stage renal disease: Secondary | ICD-10-CM | POA: Diagnosis not present

## 2018-12-13 DIAGNOSIS — D509 Iron deficiency anemia, unspecified: Secondary | ICD-10-CM | POA: Diagnosis not present

## 2018-12-13 DIAGNOSIS — E1129 Type 2 diabetes mellitus with other diabetic kidney complication: Secondary | ICD-10-CM | POA: Diagnosis not present

## 2018-12-13 DIAGNOSIS — D631 Anemia in chronic kidney disease: Secondary | ICD-10-CM | POA: Diagnosis not present

## 2018-12-13 DIAGNOSIS — N2581 Secondary hyperparathyroidism of renal origin: Secondary | ICD-10-CM | POA: Diagnosis not present

## 2018-12-16 DIAGNOSIS — N186 End stage renal disease: Secondary | ICD-10-CM | POA: Diagnosis not present

## 2018-12-16 DIAGNOSIS — D631 Anemia in chronic kidney disease: Secondary | ICD-10-CM | POA: Diagnosis not present

## 2018-12-16 DIAGNOSIS — E1129 Type 2 diabetes mellitus with other diabetic kidney complication: Secondary | ICD-10-CM | POA: Diagnosis not present

## 2018-12-16 DIAGNOSIS — N2581 Secondary hyperparathyroidism of renal origin: Secondary | ICD-10-CM | POA: Diagnosis not present

## 2018-12-16 DIAGNOSIS — D509 Iron deficiency anemia, unspecified: Secondary | ICD-10-CM | POA: Diagnosis not present

## 2018-12-18 DIAGNOSIS — D631 Anemia in chronic kidney disease: Secondary | ICD-10-CM | POA: Diagnosis not present

## 2018-12-18 DIAGNOSIS — E1129 Type 2 diabetes mellitus with other diabetic kidney complication: Secondary | ICD-10-CM | POA: Diagnosis not present

## 2018-12-18 DIAGNOSIS — D509 Iron deficiency anemia, unspecified: Secondary | ICD-10-CM | POA: Diagnosis not present

## 2018-12-18 DIAGNOSIS — N2581 Secondary hyperparathyroidism of renal origin: Secondary | ICD-10-CM | POA: Diagnosis not present

## 2018-12-18 DIAGNOSIS — N186 End stage renal disease: Secondary | ICD-10-CM | POA: Diagnosis not present

## 2018-12-19 ENCOUNTER — Ambulatory Visit: Payer: Medicare Other | Admitting: Physician Assistant

## 2018-12-20 DIAGNOSIS — E1129 Type 2 diabetes mellitus with other diabetic kidney complication: Secondary | ICD-10-CM | POA: Diagnosis not present

## 2018-12-20 DIAGNOSIS — N186 End stage renal disease: Secondary | ICD-10-CM | POA: Diagnosis not present

## 2018-12-20 DIAGNOSIS — D631 Anemia in chronic kidney disease: Secondary | ICD-10-CM | POA: Diagnosis not present

## 2018-12-20 DIAGNOSIS — D509 Iron deficiency anemia, unspecified: Secondary | ICD-10-CM | POA: Diagnosis not present

## 2018-12-20 DIAGNOSIS — N2581 Secondary hyperparathyroidism of renal origin: Secondary | ICD-10-CM | POA: Diagnosis not present

## 2018-12-23 DIAGNOSIS — D509 Iron deficiency anemia, unspecified: Secondary | ICD-10-CM | POA: Diagnosis not present

## 2018-12-23 DIAGNOSIS — D631 Anemia in chronic kidney disease: Secondary | ICD-10-CM | POA: Diagnosis not present

## 2018-12-23 DIAGNOSIS — N186 End stage renal disease: Secondary | ICD-10-CM | POA: Diagnosis not present

## 2018-12-23 DIAGNOSIS — E1129 Type 2 diabetes mellitus with other diabetic kidney complication: Secondary | ICD-10-CM | POA: Diagnosis not present

## 2018-12-23 DIAGNOSIS — N2581 Secondary hyperparathyroidism of renal origin: Secondary | ICD-10-CM | POA: Diagnosis not present

## 2018-12-25 DIAGNOSIS — N2581 Secondary hyperparathyroidism of renal origin: Secondary | ICD-10-CM | POA: Diagnosis not present

## 2018-12-25 DIAGNOSIS — E1129 Type 2 diabetes mellitus with other diabetic kidney complication: Secondary | ICD-10-CM | POA: Diagnosis not present

## 2018-12-25 DIAGNOSIS — D631 Anemia in chronic kidney disease: Secondary | ICD-10-CM | POA: Diagnosis not present

## 2018-12-25 DIAGNOSIS — D509 Iron deficiency anemia, unspecified: Secondary | ICD-10-CM | POA: Diagnosis not present

## 2018-12-25 DIAGNOSIS — N186 End stage renal disease: Secondary | ICD-10-CM | POA: Diagnosis not present

## 2018-12-27 DIAGNOSIS — N2581 Secondary hyperparathyroidism of renal origin: Secondary | ICD-10-CM | POA: Diagnosis not present

## 2018-12-27 DIAGNOSIS — D631 Anemia in chronic kidney disease: Secondary | ICD-10-CM | POA: Diagnosis not present

## 2018-12-27 DIAGNOSIS — D509 Iron deficiency anemia, unspecified: Secondary | ICD-10-CM | POA: Diagnosis not present

## 2018-12-27 DIAGNOSIS — E1129 Type 2 diabetes mellitus with other diabetic kidney complication: Secondary | ICD-10-CM | POA: Diagnosis not present

## 2018-12-27 DIAGNOSIS — N186 End stage renal disease: Secondary | ICD-10-CM | POA: Diagnosis not present

## 2018-12-28 DIAGNOSIS — Z992 Dependence on renal dialysis: Secondary | ICD-10-CM | POA: Diagnosis not present

## 2018-12-28 DIAGNOSIS — E1129 Type 2 diabetes mellitus with other diabetic kidney complication: Secondary | ICD-10-CM | POA: Diagnosis not present

## 2018-12-28 DIAGNOSIS — N186 End stage renal disease: Secondary | ICD-10-CM | POA: Diagnosis not present

## 2018-12-30 DIAGNOSIS — N2581 Secondary hyperparathyroidism of renal origin: Secondary | ICD-10-CM | POA: Diagnosis not present

## 2018-12-30 DIAGNOSIS — E1129 Type 2 diabetes mellitus with other diabetic kidney complication: Secondary | ICD-10-CM | POA: Diagnosis not present

## 2018-12-30 DIAGNOSIS — D509 Iron deficiency anemia, unspecified: Secondary | ICD-10-CM | POA: Diagnosis not present

## 2018-12-30 DIAGNOSIS — N186 End stage renal disease: Secondary | ICD-10-CM | POA: Diagnosis not present

## 2018-12-30 DIAGNOSIS — D631 Anemia in chronic kidney disease: Secondary | ICD-10-CM | POA: Diagnosis not present

## 2018-12-31 ENCOUNTER — Other Ambulatory Visit: Payer: Self-pay

## 2018-12-31 DIAGNOSIS — N186 End stage renal disease: Secondary | ICD-10-CM

## 2018-12-31 DIAGNOSIS — Z992 Dependence on renal dialysis: Principal | ICD-10-CM

## 2019-01-01 DIAGNOSIS — D509 Iron deficiency anemia, unspecified: Secondary | ICD-10-CM | POA: Diagnosis not present

## 2019-01-01 DIAGNOSIS — N186 End stage renal disease: Secondary | ICD-10-CM | POA: Diagnosis not present

## 2019-01-01 DIAGNOSIS — N2581 Secondary hyperparathyroidism of renal origin: Secondary | ICD-10-CM | POA: Diagnosis not present

## 2019-01-01 DIAGNOSIS — E1129 Type 2 diabetes mellitus with other diabetic kidney complication: Secondary | ICD-10-CM | POA: Diagnosis not present

## 2019-01-01 DIAGNOSIS — D631 Anemia in chronic kidney disease: Secondary | ICD-10-CM | POA: Diagnosis not present

## 2019-01-02 ENCOUNTER — Ambulatory Visit: Payer: Medicare Other | Admitting: Family

## 2019-01-02 ENCOUNTER — Ambulatory Visit (HOSPITAL_COMMUNITY): Payer: Medicare Other

## 2019-01-03 DIAGNOSIS — N2581 Secondary hyperparathyroidism of renal origin: Secondary | ICD-10-CM | POA: Diagnosis not present

## 2019-01-03 DIAGNOSIS — E1129 Type 2 diabetes mellitus with other diabetic kidney complication: Secondary | ICD-10-CM | POA: Diagnosis not present

## 2019-01-03 DIAGNOSIS — N186 End stage renal disease: Secondary | ICD-10-CM | POA: Diagnosis not present

## 2019-01-03 DIAGNOSIS — D631 Anemia in chronic kidney disease: Secondary | ICD-10-CM | POA: Diagnosis not present

## 2019-01-03 DIAGNOSIS — D509 Iron deficiency anemia, unspecified: Secondary | ICD-10-CM | POA: Diagnosis not present

## 2019-01-06 DIAGNOSIS — N2581 Secondary hyperparathyroidism of renal origin: Secondary | ICD-10-CM | POA: Diagnosis not present

## 2019-01-06 DIAGNOSIS — E1129 Type 2 diabetes mellitus with other diabetic kidney complication: Secondary | ICD-10-CM | POA: Diagnosis not present

## 2019-01-06 DIAGNOSIS — N186 End stage renal disease: Secondary | ICD-10-CM | POA: Diagnosis not present

## 2019-01-06 DIAGNOSIS — D509 Iron deficiency anemia, unspecified: Secondary | ICD-10-CM | POA: Diagnosis not present

## 2019-01-06 DIAGNOSIS — D631 Anemia in chronic kidney disease: Secondary | ICD-10-CM | POA: Diagnosis not present

## 2019-01-08 DIAGNOSIS — E1129 Type 2 diabetes mellitus with other diabetic kidney complication: Secondary | ICD-10-CM | POA: Diagnosis not present

## 2019-01-08 DIAGNOSIS — N2581 Secondary hyperparathyroidism of renal origin: Secondary | ICD-10-CM | POA: Diagnosis not present

## 2019-01-08 DIAGNOSIS — D509 Iron deficiency anemia, unspecified: Secondary | ICD-10-CM | POA: Diagnosis not present

## 2019-01-08 DIAGNOSIS — D631 Anemia in chronic kidney disease: Secondary | ICD-10-CM | POA: Diagnosis not present

## 2019-01-08 DIAGNOSIS — N186 End stage renal disease: Secondary | ICD-10-CM | POA: Diagnosis not present

## 2019-01-09 ENCOUNTER — Ambulatory Visit: Payer: BC Managed Care – PPO | Admitting: Podiatry

## 2019-01-10 DIAGNOSIS — E1129 Type 2 diabetes mellitus with other diabetic kidney complication: Secondary | ICD-10-CM | POA: Diagnosis not present

## 2019-01-10 DIAGNOSIS — N186 End stage renal disease: Secondary | ICD-10-CM | POA: Diagnosis not present

## 2019-01-10 DIAGNOSIS — D631 Anemia in chronic kidney disease: Secondary | ICD-10-CM | POA: Diagnosis not present

## 2019-01-10 DIAGNOSIS — N2581 Secondary hyperparathyroidism of renal origin: Secondary | ICD-10-CM | POA: Diagnosis not present

## 2019-01-10 DIAGNOSIS — D509 Iron deficiency anemia, unspecified: Secondary | ICD-10-CM | POA: Diagnosis not present

## 2019-01-13 DIAGNOSIS — N2581 Secondary hyperparathyroidism of renal origin: Secondary | ICD-10-CM | POA: Diagnosis not present

## 2019-01-13 DIAGNOSIS — D509 Iron deficiency anemia, unspecified: Secondary | ICD-10-CM | POA: Diagnosis not present

## 2019-01-13 DIAGNOSIS — D631 Anemia in chronic kidney disease: Secondary | ICD-10-CM | POA: Diagnosis not present

## 2019-01-13 DIAGNOSIS — N186 End stage renal disease: Secondary | ICD-10-CM | POA: Diagnosis not present

## 2019-01-13 DIAGNOSIS — E1129 Type 2 diabetes mellitus with other diabetic kidney complication: Secondary | ICD-10-CM | POA: Diagnosis not present

## 2019-01-14 ENCOUNTER — Ambulatory Visit: Payer: Medicare Other | Admitting: Physician Assistant

## 2019-01-15 DIAGNOSIS — N2581 Secondary hyperparathyroidism of renal origin: Secondary | ICD-10-CM | POA: Diagnosis not present

## 2019-01-15 DIAGNOSIS — E1129 Type 2 diabetes mellitus with other diabetic kidney complication: Secondary | ICD-10-CM | POA: Diagnosis not present

## 2019-01-15 DIAGNOSIS — D509 Iron deficiency anemia, unspecified: Secondary | ICD-10-CM | POA: Diagnosis not present

## 2019-01-15 DIAGNOSIS — N186 End stage renal disease: Secondary | ICD-10-CM | POA: Diagnosis not present

## 2019-01-15 DIAGNOSIS — D631 Anemia in chronic kidney disease: Secondary | ICD-10-CM | POA: Diagnosis not present

## 2019-01-17 DIAGNOSIS — D631 Anemia in chronic kidney disease: Secondary | ICD-10-CM | POA: Diagnosis not present

## 2019-01-17 DIAGNOSIS — N186 End stage renal disease: Secondary | ICD-10-CM | POA: Diagnosis not present

## 2019-01-17 DIAGNOSIS — N2581 Secondary hyperparathyroidism of renal origin: Secondary | ICD-10-CM | POA: Diagnosis not present

## 2019-01-17 DIAGNOSIS — E1129 Type 2 diabetes mellitus with other diabetic kidney complication: Secondary | ICD-10-CM | POA: Diagnosis not present

## 2019-01-17 DIAGNOSIS — D509 Iron deficiency anemia, unspecified: Secondary | ICD-10-CM | POA: Diagnosis not present

## 2019-01-20 DIAGNOSIS — D631 Anemia in chronic kidney disease: Secondary | ICD-10-CM | POA: Diagnosis not present

## 2019-01-20 DIAGNOSIS — D509 Iron deficiency anemia, unspecified: Secondary | ICD-10-CM | POA: Diagnosis not present

## 2019-01-20 DIAGNOSIS — N186 End stage renal disease: Secondary | ICD-10-CM | POA: Diagnosis not present

## 2019-01-20 DIAGNOSIS — E1129 Type 2 diabetes mellitus with other diabetic kidney complication: Secondary | ICD-10-CM | POA: Diagnosis not present

## 2019-01-20 DIAGNOSIS — N2581 Secondary hyperparathyroidism of renal origin: Secondary | ICD-10-CM | POA: Diagnosis not present

## 2019-01-22 DIAGNOSIS — D509 Iron deficiency anemia, unspecified: Secondary | ICD-10-CM | POA: Diagnosis not present

## 2019-01-22 DIAGNOSIS — N186 End stage renal disease: Secondary | ICD-10-CM | POA: Diagnosis not present

## 2019-01-22 DIAGNOSIS — E1129 Type 2 diabetes mellitus with other diabetic kidney complication: Secondary | ICD-10-CM | POA: Diagnosis not present

## 2019-01-22 DIAGNOSIS — N2581 Secondary hyperparathyroidism of renal origin: Secondary | ICD-10-CM | POA: Diagnosis not present

## 2019-01-22 DIAGNOSIS — D631 Anemia in chronic kidney disease: Secondary | ICD-10-CM | POA: Diagnosis not present

## 2019-01-23 ENCOUNTER — Ambulatory Visit (HOSPITAL_COMMUNITY)
Admission: RE | Admit: 2019-01-23 | Discharge: 2019-01-23 | Disposition: A | Discharge status: Home / Self Care | Payer: Medicare Other | Source: Ambulatory Visit | Attending: Vascular Surgery | Admitting: Vascular Surgery

## 2019-01-23 ENCOUNTER — Encounter: Payer: Self-pay | Admitting: Family

## 2019-01-23 ENCOUNTER — Other Ambulatory Visit: Payer: Self-pay

## 2019-01-23 ENCOUNTER — Ambulatory Visit (INDEPENDENT_AMBULATORY_CARE_PROVIDER_SITE_OTHER): Payer: Medicare Other | Admitting: Physician Assistant

## 2019-01-23 VITALS — BP 121/68 | HR 100 | Temp 99.0°F | Resp 24 | Ht 66.0 in | Wt 395.0 lb

## 2019-01-23 DIAGNOSIS — Z992 Dependence on renal dialysis: Secondary | ICD-10-CM | POA: Diagnosis not present

## 2019-01-23 DIAGNOSIS — N186 End stage renal disease: Secondary | ICD-10-CM

## 2019-01-23 NOTE — Progress Notes (Signed)
Established Dialysis Access   History of Present Illness   [name] is a 58 y.o. (February 06, 1961) female who presents for re-evaluation of permanent access.  She is status post placement of left arm AV graft by Dr. Donzetta Matters on 09/2018.  Graft was placed 1 surgical plan was not originally to revise existing fistula.  Patient returns to clinic today with graft duplex stating that they have not been able to dialyze her via left arm AV graft for the past 1 month.  She has been dialyzing via right IJ tunneled dialysis catheter.  She states she still has left fifth finger and ulnar distribution numbness since placement of AV graft.  She is unhappy during her office visit today and is finding it hard to understand why she cannot simply have a dialysis access that functions properly.  Current Outpatient Medications  Medication Sig Dispense Refill  . acetaminophen (TYLENOL) 500 MG tablet Take 1,000 mg by mouth daily as needed for moderate pain.    Marland Kitchen aspirin EC 81 MG tablet Take 81 mg by mouth at bedtime.     . B Complex-C-Folic Acid (NEPHRO-VITE PO) Take 1 tablet by mouth daily.    . Calcium Acetate 667 MG TABS Take 667 mg by mouth See admin instructions. Take 667 mg by mouth three times a day with meals and 667 mg with each snack    . ciprofloxacin (CIPRO) 500 MG tablet TAKE 1 TABLET BY MOUTH EVERY DAY 30 tablet 0  . Dakins (SODIUM HYPOCHLORITE) 0.25 % SOLN Apply 1 application topically 2 (two) times daily. Apply to wound    . diltiazem (CARDIZEM) 30 MG tablet Take 1 tablet every 4 hours AS NEEDED for rapid heart rate lasting more than 30 mins as long as top blood pressure >100. (Patient taking differently: Take 30 mg by mouth every 4 (four) hours as needed (FOR A RAPID HEART RATE LASTING MORE THAN 30 MINUTES AS LONG AS SYSTOLIC ("top number") READING IS >100). ) 30 tablet 2  . diphenhydrAMINE (BENADRYL) 25 MG tablet Take 25 mg by mouth daily as needed for allergies.    Marland Kitchen doxycycline (VIBRA-TABS) 100 MG  tablet Take 1 tablet (100 mg total) by mouth 2 (two) times daily. 20 tablet 0  . DULoxetine (CYMBALTA) 60 MG capsule Take 1 capsule (60 mg total) by mouth daily. 30 capsule 0  . famotidine (PEPCID) 20 MG tablet TAKE 1 TABLET BY MOUTH AT BEDTIME (Patient taking differently: Take 20 mg by mouth at bedtime) 30 tablet 0  . feeding supplement, RESOURCE BREEZE, (RESOURCE BREEZE) LIQD Take 1 Container by mouth daily. Between a meal    . glipiZIDE (GLUCOTROL) 5 MG tablet Take 0.5 tablets (2.5 mg total) by mouth daily. 30 tablet 0  . levothyroxine (SYNTHROID, LEVOTHROID) 75 MCG tablet Take 75 mcg by mouth daily before breakfast.   0  . midodrine (PROAMATINE) 10 MG tablet Take 1 tablet (10 mg total) by mouth 3 (three) times daily. (Patient taking differently: Take 10 mg by mouth 2 (two) times daily. ) 90 tablet 0  . Multiple Vitamin (MULTIVITAMIN WITH MINERALS) TABS tablet Take 1 tablet by mouth daily.    Marland Kitchen oxyCODONE (OXY IR/ROXICODONE) 5 MG immediate release tablet Take 1 tablet (5 mg total) by mouth every 6 (six) hours as needed (for pain score of 1-4). 10 tablet 0  . oxyCODONE-acetaminophen (PERCOCET/ROXICET) 5-325 MG tablet Take 1-2 tablets by mouth every 6 (six) hours as needed. Patient has not taken since started dialysis  08/2013 due to causes hypotension (Patient taking differently: Take 2 tablets by mouth every 6 (six) hours as needed for moderate pain. ) 6 tablet 0  . pravastatin (PRAVACHOL) 40 MG tablet Take 40 mg by mouth at bedtime.     . Probiotic Product (PROBIOTIC DAILY PO) Take 1 capsule by mouth 2 (two) times daily.     . sevelamer carbonate (RENVELA) 800 MG tablet Take 2,400 mg by mouth 3 (three) times daily with meals.    . sucroferric oxyhydroxide (VELPHORO) 500 MG chewable tablet Chew 500-1,000 mg by mouth See admin instructions. Take 1000 mg with each meal and 500 mg with each snack     No current facility-administered medications for this visit.     On ROS today: 10 system ROS  otherwise negative   Physical Examination   Vitals:   01/23/19 1340  BP: 121/68  Pulse: 100  Resp: (!) 24  Temp: 99 F (37.2 C)  SpO2: 94%  Weight: (!) 395 lb (179.2 kg)  Height: 5\' 6"  (1.676 m)   Body mass index is 63.75 kg/m.  General Alert, O x 3, WD, NAD  Pulmonary Sym exp, good B air movt, CTA B  Cardiac RRR, Nl S1, S2  Vascular Vessel Right Left  Radial Palpable Palpable  Ulnar Not palpable Not palpable    Musculo- skeletal M/S 5/5 throughout  , Extremities without ischemic changes    Neurologic A&O; CN grossly intact     Non-invasive Vascular Imaging   left Arm Access Duplex  (01/23/19):   AVG occluded  BUE Vein Mapping  (10/01/18):   R arm: acceptable vein conduits include basilic and cephalic    Medical Decision Making   [name] is a 58 y.o. female who presents with ESRD requiring hemodialysis.    Duplex shows that left arm AV graft is occluded  Patient states they have not been using left arm graft for about 1 month; it is unknown how long graft has been occluded  Duplex imaging reviewed with Dr. Oneida Alar which shows that axillary vein is patent beyond the venous anastomosis  Dr. Oneida Alar believes it may be worth an attempted thrombectomy and revision; I explained to the patient there is no guarantee that this graft will remain patent and usable for dialysis long-term  Option 2 would be to move to her right arm and attempt AV fistula creation versus graft placement  Patient and family member are unable to decide which option they like to proceed with at this time; they would like to think about it and call a surgery scheduler tomorrow with their decision  I cautioned the patient and family member to not wait much longer if we are going to attempt thrombectomy and revision of left arm AV graft  She will not need to be brought back for an office visit and she may be scheduled on a nondialysis day in the near future for either:   1) left arm AV  graft thrombectomy and revision, or  2) right arm AV fistula creation vs graft placement  Risk, benefits, and alternatives to access surgery were discussed.    The patient is aware the risks include but are not limited to: bleeding, infection, steal syndrome, nerve damage, thrombosis, failure to mature, and need for additional procedures.     Dagoberto Ligas PA-C [facility] Office: [phone]  Clinic MD: Dr. Oneida Alar

## 2019-01-24 DIAGNOSIS — E1129 Type 2 diabetes mellitus with other diabetic kidney complication: Secondary | ICD-10-CM | POA: Diagnosis not present

## 2019-01-24 DIAGNOSIS — D631 Anemia in chronic kidney disease: Secondary | ICD-10-CM | POA: Diagnosis not present

## 2019-01-24 DIAGNOSIS — N186 End stage renal disease: Secondary | ICD-10-CM | POA: Diagnosis not present

## 2019-01-24 DIAGNOSIS — N2581 Secondary hyperparathyroidism of renal origin: Secondary | ICD-10-CM | POA: Diagnosis not present

## 2019-01-24 DIAGNOSIS — D509 Iron deficiency anemia, unspecified: Secondary | ICD-10-CM | POA: Diagnosis not present

## 2019-01-26 DIAGNOSIS — Z992 Dependence on renal dialysis: Secondary | ICD-10-CM | POA: Diagnosis not present

## 2019-01-26 DIAGNOSIS — N186 End stage renal disease: Secondary | ICD-10-CM | POA: Diagnosis not present

## 2019-01-26 DIAGNOSIS — E1129 Type 2 diabetes mellitus with other diabetic kidney complication: Secondary | ICD-10-CM | POA: Diagnosis not present

## 2019-01-27 ENCOUNTER — Emergency Department (HOSPITAL_COMMUNITY): Payer: Medicare Other

## 2019-01-27 ENCOUNTER — Telehealth: Payer: Self-pay

## 2019-01-27 ENCOUNTER — Inpatient Hospital Stay (HOSPITAL_COMMUNITY)
Admission: EM | Admit: 2019-01-27 | Discharge: 2019-01-31 | DRG: 871 | Disposition: A | Discharge status: Home Health | Payer: Medicare Other | Attending: Internal Medicine | Admitting: Internal Medicine

## 2019-01-27 ENCOUNTER — Encounter (HOSPITAL_COMMUNITY): Payer: Self-pay

## 2019-01-27 DIAGNOSIS — Z8249 Family history of ischemic heart disease and other diseases of the circulatory system: Secondary | ICD-10-CM

## 2019-01-27 DIAGNOSIS — D631 Anemia in chronic kidney disease: Secondary | ICD-10-CM | POA: Diagnosis present

## 2019-01-27 DIAGNOSIS — R0603 Acute respiratory distress: Secondary | ICD-10-CM | POA: Diagnosis not present

## 2019-01-27 DIAGNOSIS — J9621 Acute and chronic respiratory failure with hypoxia: Secondary | ICD-10-CM | POA: Diagnosis present

## 2019-01-27 DIAGNOSIS — A419 Sepsis, unspecified organism: Principal | ICD-10-CM | POA: Diagnosis present

## 2019-01-27 DIAGNOSIS — Z872 Personal history of diseases of the skin and subcutaneous tissue: Secondary | ICD-10-CM | POA: Diagnosis not present

## 2019-01-27 DIAGNOSIS — Z8674 Personal history of sudden cardiac arrest: Secondary | ICD-10-CM

## 2019-01-27 DIAGNOSIS — N2581 Secondary hyperparathyroidism of renal origin: Secondary | ICD-10-CM | POA: Diagnosis not present

## 2019-01-27 DIAGNOSIS — Z87891 Personal history of nicotine dependence: Secondary | ICD-10-CM

## 2019-01-27 DIAGNOSIS — E119 Type 2 diabetes mellitus without complications: Secondary | ICD-10-CM

## 2019-01-27 DIAGNOSIS — R000 Tachycardia, unspecified: Secondary | ICD-10-CM | POA: Diagnosis not present

## 2019-01-27 DIAGNOSIS — Z974 Presence of external hearing-aid: Secondary | ICD-10-CM

## 2019-01-27 DIAGNOSIS — F329 Major depressive disorder, single episode, unspecified: Secondary | ICD-10-CM | POA: Diagnosis present

## 2019-01-27 DIAGNOSIS — R0902 Hypoxemia: Secondary | ICD-10-CM | POA: Diagnosis not present

## 2019-01-27 DIAGNOSIS — N186 End stage renal disease: Secondary | ICD-10-CM | POA: Diagnosis present

## 2019-01-27 DIAGNOSIS — E039 Hypothyroidism, unspecified: Secondary | ICD-10-CM | POA: Diagnosis present

## 2019-01-27 DIAGNOSIS — J189 Pneumonia, unspecified organism: Secondary | ICD-10-CM | POA: Diagnosis present

## 2019-01-27 DIAGNOSIS — I953 Hypotension of hemodialysis: Secondary | ICD-10-CM | POA: Diagnosis present

## 2019-01-27 DIAGNOSIS — R918 Other nonspecific abnormal finding of lung field: Secondary | ICD-10-CM | POA: Diagnosis not present

## 2019-01-27 DIAGNOSIS — R0689 Other abnormalities of breathing: Secondary | ICD-10-CM | POA: Diagnosis not present

## 2019-01-27 DIAGNOSIS — Z7982 Long term (current) use of aspirin: Secondary | ICD-10-CM

## 2019-01-27 DIAGNOSIS — Z79899 Other long term (current) drug therapy: Secondary | ICD-10-CM

## 2019-01-27 DIAGNOSIS — R404 Transient alteration of awareness: Secondary | ICD-10-CM | POA: Diagnosis not present

## 2019-01-27 DIAGNOSIS — R410 Disorientation, unspecified: Secondary | ICD-10-CM | POA: Diagnosis not present

## 2019-01-27 DIAGNOSIS — D509 Iron deficiency anemia, unspecified: Secondary | ICD-10-CM | POA: Diagnosis not present

## 2019-01-27 DIAGNOSIS — G92 Toxic encephalopathy: Secondary | ICD-10-CM | POA: Diagnosis not present

## 2019-01-27 DIAGNOSIS — E11319 Type 2 diabetes mellitus with unspecified diabetic retinopathy without macular edema: Secondary | ICD-10-CM | POA: Diagnosis present

## 2019-01-27 DIAGNOSIS — E1122 Type 2 diabetes mellitus with diabetic chronic kidney disease: Secondary | ICD-10-CM | POA: Diagnosis present

## 2019-01-27 DIAGNOSIS — L89159 Pressure ulcer of sacral region, unspecified stage: Secondary | ICD-10-CM | POA: Diagnosis present

## 2019-01-27 DIAGNOSIS — Z833 Family history of diabetes mellitus: Secondary | ICD-10-CM

## 2019-01-27 DIAGNOSIS — Z881 Allergy status to other antibiotic agents status: Secondary | ICD-10-CM

## 2019-01-27 DIAGNOSIS — Z888 Allergy status to other drugs, medicaments and biological substances status: Secondary | ICD-10-CM

## 2019-01-27 DIAGNOSIS — L732 Hidradenitis suppurativa: Secondary | ICD-10-CM | POA: Diagnosis not present

## 2019-01-27 DIAGNOSIS — G9340 Encephalopathy, unspecified: Secondary | ICD-10-CM | POA: Diagnosis present

## 2019-01-27 DIAGNOSIS — R6520 Severe sepsis without septic shock: Secondary | ICD-10-CM | POA: Diagnosis present

## 2019-01-27 DIAGNOSIS — E1165 Type 2 diabetes mellitus with hyperglycemia: Secondary | ICD-10-CM | POA: Diagnosis not present

## 2019-01-27 DIAGNOSIS — R0602 Shortness of breath: Secondary | ICD-10-CM

## 2019-01-27 DIAGNOSIS — I4819 Other persistent atrial fibrillation: Secondary | ICD-10-CM | POA: Diagnosis present

## 2019-01-27 DIAGNOSIS — J810 Acute pulmonary edema: Secondary | ICD-10-CM | POA: Diagnosis not present

## 2019-01-27 DIAGNOSIS — Z6841 Body Mass Index (BMI) 40.0 and over, adult: Secondary | ICD-10-CM

## 2019-01-27 DIAGNOSIS — Z992 Dependence on renal dialysis: Secondary | ICD-10-CM

## 2019-01-27 DIAGNOSIS — E785 Hyperlipidemia, unspecified: Secondary | ICD-10-CM | POA: Diagnosis present

## 2019-01-27 DIAGNOSIS — Z79890 Hormone replacement therapy (postmenopausal): Secondary | ICD-10-CM

## 2019-01-27 DIAGNOSIS — K219 Gastro-esophageal reflux disease without esophagitis: Secondary | ICD-10-CM | POA: Diagnosis present

## 2019-01-27 DIAGNOSIS — E1129 Type 2 diabetes mellitus with other diabetic kidney complication: Secondary | ICD-10-CM | POA: Diagnosis not present

## 2019-01-27 DIAGNOSIS — I132 Hypertensive heart and chronic kidney disease with heart failure and with stage 5 chronic kidney disease, or end stage renal disease: Secondary | ICD-10-CM | POA: Diagnosis present

## 2019-01-27 DIAGNOSIS — Y95 Nosocomial condition: Secondary | ICD-10-CM | POA: Diagnosis present

## 2019-01-27 DIAGNOSIS — Z7984 Long term (current) use of oral hypoglycemic drugs: Secondary | ICD-10-CM

## 2019-01-27 DIAGNOSIS — R34 Anuria and oliguria: Secondary | ICD-10-CM | POA: Diagnosis present

## 2019-01-27 DIAGNOSIS — L8990 Pressure ulcer of unspecified site, unspecified stage: Secondary | ICD-10-CM | POA: Diagnosis present

## 2019-01-27 DIAGNOSIS — J449 Chronic obstructive pulmonary disease, unspecified: Secondary | ICD-10-CM | POA: Diagnosis present

## 2019-01-27 DIAGNOSIS — I5032 Chronic diastolic (congestive) heart failure: Secondary | ICD-10-CM | POA: Diagnosis present

## 2019-01-27 DIAGNOSIS — I420 Dilated cardiomyopathy: Secondary | ICD-10-CM | POA: Diagnosis present

## 2019-01-27 DIAGNOSIS — H9190 Unspecified hearing loss, unspecified ear: Secondary | ICD-10-CM | POA: Diagnosis present

## 2019-01-27 DIAGNOSIS — G4733 Obstructive sleep apnea (adult) (pediatric): Secondary | ICD-10-CM | POA: Diagnosis present

## 2019-01-27 LAB — COMPREHENSIVE METABOLIC PANEL
ALT: 18 U/L (ref 0–44)
AST: 21 U/L (ref 15–41)
Albumin: 2.8 g/dL — ABNORMAL LOW (ref 3.5–5.0)
Alkaline Phosphatase: 123 U/L (ref 38–126)
Anion gap: 13 (ref 5–15)
BUN: 40 mg/dL — ABNORMAL HIGH (ref 6–20)
CALCIUM: 9 mg/dL (ref 8.9–10.3)
CO2: 23 mmol/L (ref 22–32)
Chloride: 95 mmol/L — ABNORMAL LOW (ref 98–111)
Creatinine, Ser: 7.36 mg/dL — ABNORMAL HIGH (ref 0.44–1.00)
GFR calc Af Amer: 6 mL/min — ABNORMAL LOW (ref >60)
GFR calc non Af Amer: 6 mL/min — ABNORMAL LOW (ref >60)
Glucose, Bld: 281 mg/dL — ABNORMAL HIGH (ref 70–99)
Potassium: 4.2 mmol/L (ref 3.5–5.1)
Sodium: 131 mmol/L — ABNORMAL LOW (ref 135–145)
TOTAL PROTEIN: 9.3 g/dL — AB (ref 6.5–8.1)
Total Bilirubin: 0.4 mg/dL (ref 0.3–1.2)

## 2019-01-27 LAB — CBC WITH DIFFERENTIAL/PLATELET
Abs Immature Granulocytes: 0.6 10*3/uL — ABNORMAL HIGH (ref 0.00–0.07)
BASOS PCT: 0 %
Basophils Absolute: 0.1 10*3/uL (ref 0.0–0.1)
EOS ABS: 0.1 10*3/uL (ref 0.0–0.5)
Eosinophils Relative: 0 %
HCT: 37.9 % (ref 36.0–46.0)
Hemoglobin: 10.2 g/dL — ABNORMAL LOW (ref 12.0–15.0)
Immature Granulocytes: 2 %
Lymphocytes Relative: 2 %
Lymphs Abs: 0.6 10*3/uL — ABNORMAL LOW (ref 0.7–4.0)
MCH: 28.1 pg (ref 26.0–34.0)
MCHC: 26.9 g/dL — ABNORMAL LOW (ref 30.0–36.0)
MCV: 104.4 fL — ABNORMAL HIGH (ref 80.0–100.0)
MONOS PCT: 2 %
Monocytes Absolute: 0.6 10*3/uL (ref 0.1–1.0)
NEUTROS PCT: 94 %
Neutro Abs: 31.1 10*3/uL — ABNORMAL HIGH (ref 1.7–7.7)
Platelets: 340 10*3/uL (ref 150–400)
RBC: 3.63 MIL/uL — ABNORMAL LOW (ref 3.87–5.11)
RDW: 18.2 % — ABNORMAL HIGH (ref 11.5–15.5)
WBC: 33 10*3/uL — ABNORMAL HIGH (ref 4.0–10.5)
nRBC: 0.1 % (ref 0.0–0.2)

## 2019-01-27 LAB — POCT I-STAT 7, (LYTES, BLD GAS, ICA,H+H)
ACID-BASE DEFICIT: 1 mmol/L (ref 0.0–2.0)
Bicarbonate: 23.5 mmol/L (ref 20.0–28.0)
Calcium, Ion: 1.18 mmol/L (ref 1.15–1.40)
HCT: 35 % — ABNORMAL LOW (ref 36.0–46.0)
Hemoglobin: 11.9 g/dL — ABNORMAL LOW (ref 12.0–15.0)
O2 SAT: 98 %
Patient temperature: 103.5
Potassium: 4.3 mmol/L (ref 3.5–5.1)
Sodium: 132 mmol/L — ABNORMAL LOW (ref 135–145)
TCO2: 25 mmol/L (ref 22–32)
pCO2 arterial: 44.2 mmHg (ref 32.0–48.0)
pH, Arterial: 7.346 — ABNORMAL LOW (ref 7.350–7.450)
pO2, Arterial: 115 mmHg — ABNORMAL HIGH (ref 83.0–108.0)

## 2019-01-27 LAB — LACTIC ACID, PLASMA: Lactic Acid, Venous: 2.8 mmol/L (ref 0.5–1.9)

## 2019-01-27 MED ORDER — VANCOMYCIN HCL 10 G IV SOLR
2500.0000 mg | Freq: Once | INTRAVENOUS | Status: AC
  Administered 2019-01-27: 2500 mg via INTRAVENOUS
  Filled 2019-01-27: qty 2500

## 2019-01-27 MED ORDER — SODIUM CHLORIDE 0.9 % IV SOLN: 2.0000 g | Freq: Once | INTRAVENOUS | Status: DC

## 2019-01-27 MED ORDER — SODIUM CHLORIDE 0.9 % IV SOLN
1.0000 g | INTRAVENOUS | Status: DC
  Administered 2019-01-28 – 2019-01-31 (×3): 1 g via INTRAVENOUS
  Filled 2019-01-27 (×3): qty 1

## 2019-01-27 MED ORDER — METRONIDAZOLE IN NACL 5-0.79 MG/ML-% IV SOLN
500.0000 mg | Freq: Three times a day (TID) | INTRAVENOUS | Status: DC
  Administered 2019-01-27 – 2019-01-31 (×10): 500 mg via INTRAVENOUS
  Filled 2019-01-27 (×9): qty 100

## 2019-01-27 MED ORDER — VANCOMYCIN HCL IN DEXTROSE 1-5 GM/200ML-% IV SOLN: 1000.0000 mg | Freq: Once | INTRAVENOUS | Status: DC

## 2019-01-27 MED ORDER — VANCOMYCIN HCL IN DEXTROSE 1-5 GM/200ML-% IV SOLN
1000.0000 mg | INTRAVENOUS | Status: DC
  Administered 2019-01-29 (×2): 1000 mg via INTRAVENOUS
  Filled 2019-01-27: qty 200

## 2019-01-27 MED ORDER — SODIUM CHLORIDE 0.9 % IV BOLUS
500.0000 mL | Freq: Once | INTRAVENOUS | Status: AC
  Administered 2019-01-27: 500 mL via INTRAVENOUS

## 2019-01-27 MED ORDER — SODIUM CHLORIDE 0.9 % IV SOLN
1.0000 g | Freq: Once | INTRAVENOUS | Status: AC
  Administered 2019-01-27: 1 g via INTRAVENOUS
  Filled 2019-01-27: qty 1

## 2019-01-27 MED ORDER — INSULIN ASPART 100 UNIT/ML ~~LOC~~ SOLN
0.0000 [IU] | SUBCUTANEOUS | Status: DC
  Administered 2019-01-28: 3 [IU] via SUBCUTANEOUS
  Administered 2019-01-28: 1 [IU] via SUBCUTANEOUS

## 2019-01-27 NOTE — ED Notes (Signed)
Patient tolerating BiPAP at this time. Will continue to monitor.

## 2019-01-27 NOTE — Telephone Encounter (Signed)
While I realize we may need to start her on antibiotics again I am reluctant to start it just on an isolated temperature of 99.1 the drainage is more concerning and I would like them to track that

## 2019-01-27 NOTE — Telephone Encounter (Signed)
Paient's Mother calling with complaint of increased fever of 99.1 . Dialysis was stopped early today due to patient not feeling well.   Mother is requesting restart of antibiotics.  She seemed to be better when on antibiotics. Yellow pus drainage from area on buttocks with foul smell and chills.     Please advise.

## 2019-01-27 NOTE — Consult Note (Signed)
NAME:  [name], MRN:  [id number], DOB:  26-Dec-1960, LOS: 0 ADMISSION DATE:  01/27/2019, CONSULTATION DATE:  01/27/19 REFERRING MD:  Melina Copa  CHIEF COMPLAINT:  Generalized weakness, SOB, "not feeling well"  Brief History   [name] is a 58 y.o. female who was admitted 3/2 with generalized weakness and pulmonary edema after not completing her HD session earlier that day.  History of present illness   [name] is a 58 y.o. female who has a PMH as outlined below including but not limited to ESRD on HD MWF, CHF, dilated cardiomyopathy, (see "past medical history").  She presented to [facility] ED 3/2 due to "not feeling well".  She left dialysis before treatment completion because of this along with having SOB.  In ED, CXR demonstrated vascular congestion.  She was subsequently placed on BiPAP.  She had borderline BP's with one SBP reading in the 60's.  This improved after cuff was repositioned and also changed to a larger size.  Repeat SBP's in the 90s.  ABG was normal.  She was found to have fever to 103 and WBC 33.  Denies cough, N/V/D, abd pain, myalgias, exposures to known sick contacts, recent travel.  Due to her borderline BP and pulmonary edema, PCCM was asked to see in consultation.  Per chart review, she takes 10mg  midodrine TID on dialysis days.  Past Medical History  ESRD on HD MWF, OSA (intolerant to CPAP), PAF, HLD, DCM, cardiac arrest, DM2, depression, chronic hidradenitis.  Significant Hospital Events   3/2 > admit.  Consults:  PCCM. Nephrology. ID.  Procedures:  None.  Significant Diagnostic Tests:  CXR 3/2 > vascular congestion. Echo 3/3 >   Micro Data:  Blood 3/2 >  Flu 3/2 >   Antimicrobials:  Vanc 3/2 x 1 Cefepime 3/2 >  Flagyl 3/2 >    Interim history/subjective:  Comfortable on BiPAP.  No complaints.  SBP 100.  Objective:  Blood pressure 100/64, pulse (!) 112, temperature (!) 103.5 F (39.7 C), temperature source Rectal, resp. rate 20, last  menstrual period 06/13/2013, SpO2 100 %.       No intake or output data in the 24 hours ending 01/27/19 2336 There were no vitals filed for this visit.  Examination: General: Obese female, in NAD. Neuro: A&O x 3, no deficits. HEENT: [place]/AT. Sclerae anicteric.  BiPAP in place. Cardiovascular: RRR, no M/R/G.  Lungs: Respirations even and unlabored.  Coarse crackles bilaterally. Abdomen: BS x 4, soft, NT/ND.  Musculoskeletal: No gross deformities, 1+ edema.  Skin: Intact, warm, no rashes.  Assessment & Plan:   Acute pulmonary edema - secondary to not completing HD session 3/2. - Continue BiPAP overnight. - Please consult nephrology in AM. - No lasix given that pt is anuric.  Sepsis - unclear etiology, question due to chronic hidradenitis.  Febrile to 103 in ED with WBC 33. - Continue cefepime / flagyl. - Follow cultures, RVP. - Repeat echo.  Chronic hidradenitis - followed by ID as an outpatient. - Continue abx as above for now. - Please consult ID in AM.  ESRD on HD MWF. - Please consult nephrology in AM.  Rest per primary team.  [facility] for SDU admission under [facility].  Nothing further to add.  PCCM will sign off.  Please do not hesitate to call us back if we can be of any further assistance.   Best Practice:  Diet: NPO for tonight. Pain/Anxiety/Delirium protocol (if indicated): N/A. VAP protocol (if indicated): N/A. DVT  prophylaxis: SCD's / Heparin. GI prophylaxis: N/A. Glucose control: SSI. Mobility: Bedrest. Code Status: Full. Family Communication: Family updated at bedside. Disposition: SDU.  Labs   CBC: Recent Labs  Lab 01/27/19 2012 01/27/19 2029  WBC 33.0*  --   NEUTROABS 31.1*  --   HGB 10.2* 11.9*  HCT 37.9 35.0*  MCV 104.4*  --   PLT 340  --    Basic Metabolic Panel: Recent Labs  Lab 01/27/19 2012 01/27/19 2029  NA 131* 132*  K 4.2 4.3  CL 95*  --   CO2 23  --   GLUCOSE 281*  --   BUN 40*  --   CREATININE 7.36*  --   CALCIUM 9.0  --     GFR: Estimated Creatinine Clearance: 14.3 mL/min (A) (by C-G formula based on SCr of 7.36 mg/dL (H)). Recent Labs  Lab 01/27/19 2012  WBC 33.0*  LATICACIDVEN 2.8*   Liver Function Tests: Recent Labs  Lab 01/27/19 2012  AST 21  ALT 18  ALKPHOS 123  BILITOT 0.4  PROT 9.3*  ALBUMIN 2.8*   No results for input(s): LIPASE, AMYLASE in the last 168 hours. No results for input(s): AMMONIA in the last 168 hours. ABG    Component Value Date/Time   PHART 7.346 (L) 01/27/2019 2029   PCO2ART 44.2 01/27/2019 2029   PO2ART 115.0 (H) 01/27/2019 2029   HCO3 23.5 01/27/2019 2029   TCO2 25 01/27/2019 2029   ACIDBASEDEF 1.0 01/27/2019 2029   O2SAT 98.0 01/27/2019 2029    Coagulation Profile: No results for input(s): INR, PROTIME in the last 168 hours. Cardiac Enzymes: No results for input(s): CKTOTAL, CKMB, CKMBINDEX, TROPONINI in the last 168 hours. HbA1C: Hgb A1c MFr Bld  Date/Time Value Ref Range Status  12/22/2014 05:54 AM 5.3 <5.7 % Final    Comment:    (NOTE)                                                                       According to the ADA Clinical Practice Recommendations for 2011, when HbA1c is used as a screening test:  >=6.5%   Diagnostic of Diabetes Mellitus           (if abnormal result is confirmed) 5.7-6.4%   Increased risk of developing Diabetes Mellitus References:Diagnosis and Classification of Diabetes Mellitus,Diabetes Care,2011,34(Suppl 1):S62-S69 and Standards of Medical Care in         Diabetes - 2011,Diabetes NLGX,2119,41 (Suppl 1):S11-S61.   07/17/2013 07:50 AM 5.7 (H) <5.7 % Final    Comment:    (NOTE)                                                                       According to the ADA Clinical Practice Recommendations for 2011, when HbA1c is used as a screening test:  >=6.5%   Diagnostic of Diabetes Mellitus           (if abnormal result is confirmed) 5.7-6.4%   Increased risk of developing  Diabetes Mellitus References:Diagnosis  and Classification of Diabetes Mellitus,Diabetes RKYH,0623,76(EGBTD 1):S62-S69 and Standards of Medical Care in         Diabetes - 2011,Diabetes VVOH,6073,71 (Suppl 1):S11-S61.   CBG: No results for input(s): GLUCAP in the last 168 hours.  Review of Systems:   All negative; except for those that are bolded, which indicate positives.  Constitutional: weight loss, weight gain, night sweats, fevers, chills, fatigue, weakness.  HEENT: headaches, sore throat, sneezing, nasal congestion, post nasal drip, difficulty swallowing, tooth/dental problems, visual complaints, visual changes, ear aches. Neuro: difficulty with speech, weakness, numbness, ataxia. CV:  chest pain, orthopnea, PND, swelling in lower extremities, dizziness, palpitations, syncope.  Resp: cough, hemoptysis, dyspnea, wheezing. GI: heartburn, indigestion, abdominal pain, nausea, vomiting, diarrhea, constipation, change in bowel habits, loss of appetite, hematemesis, melena, hematochezia.  GU: dysuria, change in color of urine, urgency or frequency, flank pain, hematuria. MSK: joint pain or swelling, decreased range of motion. Psych: change in mood or affect, depression, anxiety, suicidal ideations, homicidal ideations. Skin: rash, itching, bruising.   Past medical history  She,  has a past medical history of Chronic diastolic CHF (congestive heart failure) ([place]), DCM (dilated cardiomyopathy) ([place]), Depression, DM type 2 (diabetes mellitus, type 2) ([place]), ESRD on hemodialysis ([place]) (08/21/2017), GERD (gastroesophageal reflux disease), H/O cardiac arrest (10/2012), Hard of hearing (10/2012), History of hemodialysis (dec 2013), HTN (hypertension), Hydradenitis, Hyperkalemia, Hyperlipidemia, Hypothyroidism, Iron deficiency anemia, Morbid obesity ([place]), Multinodular goiter, Neuropathic pain (08/21/2017), Orthostatic hypotension (06/27/2018), PAF (paroxysmal atrial fibrillation) ([place]), Paroxysmal atrial flutter ([place]), Pneumonia (Nov 10, 2012),  Sleep apnea, Tinnitus (12/26/2016), Typical atrial flutter ([place]) (04/10/2017), and Urinary tract infection.   Surgical History    Past Surgical History:  Procedure Laterality Date  . AV FISTULA PLACEMENT Left 07/25/2013   Procedure: ARTERIOVENOUS (AV) FISTULA CREATION ;  Surgeon: Rosetta Posner, MD;  Location: [facility];  Service: Vascular;  Laterality: Left;  . AV FISTULA PLACEMENT Left 10/10/2018   Procedure: INSERTION OF LEFT UPPER ARM brachial to axillary artery ARTERIOVENOUS (AV) GORE-TEX GRAFT using 4-7 stretch graft;  Surgeon: Waynetta Sandy, MD;  Location: [facility];  Service: Vascular;  Laterality: Left;  . CYSTOSCOPY W/ RETROGRADES  11/16/2012   Procedure: CYSTOSCOPY WITH RETROGRADE PYELOGRAM;  Surgeon: Reece Packer, MD;  Location: [facility];  Service: Urology;  Laterality: Bilateral;  CYSTOSCOPY,BILATERAL RETROGRADE PYELOGRAM/ REMOVAL LEFT URETERAL STENT/ FULGERATION BLADDER MUCOSA/ INSERTION RIGHT URETERAL STENT  . CYSTOSCOPY W/ URETERAL STENT PLACEMENT  05/28/2012   Procedure: CYSTOSCOPY WITH RETROGRADE PYELOGRAM/URETERAL STENT PLACEMENT;  Surgeon: Reece Packer, MD;  [facility];  Service: Urology;  Laterality: Left;  . CYSTOSCOPY WITH RETROGRADE PYELOGRAM, URETEROSCOPY AND STENT PLACEMENT Right 06/23/2013   Procedure: CYSTOSCOPY WITH RIGHT URETEROSCOPY, RIGHT  RETROGRADE PYELOGRAM, WITH LASER LIPOTRIPSY AND RIGHT URETERAL STENT EXCHANGE ;  Surgeon: Molli Hazard, MD;  Location: [facility];  Service: Urology;  Laterality: Right;  STENT EXCHANGE    . CYSTOSCOPY WITH RETROGRADE PYELOGRAM, URETEROSCOPY AND STENT PLACEMENT Right 11/06/2013   Procedure: CYSTOSCOPY WITH RETROGRADE PYELOGRAM, URETEROSCOPY STONE REMOVAL AND STENT REMOVAL;  Surgeon: Molli Hazard, MD;  [facility];  Service: Urology;  Laterality: Right;  . HOLMIUM LASER APPLICATION N/A 0/62/6948   Procedure: HOLMIUM LASER APPLICATION;  Surgeon: Molli Hazard, MD;  Location: [facility];  Service:  Urology;  Laterality: N/A;  . INSERTION OF DIALYSIS CATHETER N/A 09/24/2013   Procedure: INSERTION OF DIALYSIS CATHETER;  Surgeon: Rosetta Posner, MD;  Location: [facility];  Service: Vascular;  Laterality: N/A;  . IR AV DIALY SHUNT INTRO NEEDLE/INTRAC INITIAL W/PTA/STENT/IMG LT Left 08/13/2018  . IR AV DIALY SHUNT INTRO NEEDLE/INTRACATH INITIAL W/PTA/IMG LEFT  04/19/2018  . IR AV DIALY SHUNT INTRO NEEDLE/INTRACATH INITIAL W/PTA/IMG LEFT  08/14/2018  . IR FLUORO GUIDE CV LINE RIGHT  09/17/2018  . IR FLUORO GUIDE CV LINE RIGHT  09/20/2018  . IR US GUIDE VASC ACCESS LEFT  04/19/2018  . IR US GUIDE VASC ACCESS LEFT  08/13/2018  . IR US GUIDE VASC ACCESS LEFT  08/14/2018  . IR US GUIDE VASC ACCESS RIGHT  09/17/2018  . PORT-A-CATH REMOVAL    . PORTACATH PLACEMENT    . REVISON OF ARTERIOVENOUS FISTULA Left 07/25/2016   Procedure: REVISON OF LEFT ARTERIOVENOUS FISTULA;  Surgeon: Elam Dutch, MD;  Location: [facility];  Service: Vascular;  Laterality: Left;  . THROMBECTOMY AND REVISION OF ARTERIOVENTOUS (AV) GORETEX  GRAFT Left 10/10/2018   Procedure: attempted of THROMBECTOMY OF ARTERIOVENTOUS FISTULA LEFT ARM;  Surgeon: Waynetta Sandy, MD;  Location: [facility];  Service: Vascular;  Laterality: Left;     Social History   reports that she quit smoking about 39 years ago. Her smoking use included cigarettes. She has a 0.25 pack-year smoking history. She has never used smokeless tobacco. She reports that she does not drink alcohol or use drugs.   Family history   Her family history includes Cancer in her maternal grandfather; Coronary artery disease in her father and mother; Diabetes type II in her mother; Hypertension in her father and mother; Kidney failure in her maternal grandmother; Malignant hyperthermia in her father.   Allergies Allergies  Allergen Reactions  . Rosiglitazone Other (See Comments)    "Avandia" = Reaction not recalled  . Amoxicillin Rash and Other (See Comments)    Tolerated  Zosyn 01/2013. [place] Has patient had a PCN reaction causing immediate rash, facial/tongue/throat swelling, SOB or lightheadedness with hypotension: Yes Has patient had a PCN reaction causing severe rash involving mucus membranes or skin necrosis: No Has patient had a PCN reaction that required hospitalization: No Has patient had a PCN reaction occurring within the last 10 years: No If all of the above answers are "NO", then may proceed with Cephalosporin use.   Marland Kitchen Amoxicillin-Pot Clavulanate Diarrhea     Home meds  Prior to Admission medications   Medication Sig Start Date End Date Taking? Authorizing Provider  acetaminophen (TYLENOL) 500 MG tablet Take 1,000 mg by mouth daily as needed for moderate pain.   Yes [provider]  aspirin EC 81 MG tablet Take 81 mg by mouth at bedtime.    Yes [provider]  B Complex-C-Folic Acid (NEPHRO-VITE PO) Take 1 tablet by mouth daily.   Yes [provider]  Calcium Acetate 667 MG TABS Take 667 mg by mouth See admin instructions. Take 667 mg by mouth three times a day with meals and 667 mg with each snack   Yes [provider]  Dakins (SODIUM HYPOCHLORITE) 0.25 % SOLN Apply 1 application topically See admin instructions. Apply to wounds as directed 2 times daily   Yes [provider]  diltiazem (CARDIZEM) 30 MG tablet Take 1 tablet every 4 hours AS NEEDED for rapid heart rate lasting more than 30 mins as long as top blood pressure >100. Patient taking differently: Take 30 mg by mouth every 4 (four) hours as needed. Take 30 mg by mouth every four hours as needed for rapid heart rate lasting more than  30 minutes, as long as Systolic number ("top blood pressure") is >100 03/09/17  Yes Sherran Needs, NP  diphenhydrAMINE (BENADRYL) 25 MG tablet Take 25 mg by mouth daily as needed for allergies.   Yes [provider]  DULoxetine (CYMBALTA) 60 MG capsule Take 1 capsule (60 mg total) by mouth daily.  12/03/12  Yes Short, Noah Delaine, MD  famotidine (PEPCID) 20 MG tablet TAKE 1 TABLET BY MOUTH AT BEDTIME Patient taking differently: Take 20 mg by mouth at bedtime.  10/15/14  Yes Tanda Rockers, MD  feeding supplement, RESOURCE BREEZE, (RESOURCE BREEZE) LIQD Take 1 Container by mouth daily as needed (for meal supplementation).    Yes [provider]  glipiZIDE (GLUCOTROL) 5 MG tablet Take 0.5 tablets (2.5 mg total) by mouth daily. 08/31/16  Yes Debbe Odea, MD  levothyroxine (SYNTHROID, LEVOTHROID) 75 MCG tablet Take 75 mcg by mouth daily before breakfast.  08/17/15  Yes [provider]  midodrine (PROAMATINE) 10 MG tablet Take 1 tablet (10 mg total) by mouth 3 (three) times daily. Patient taking differently: Take 10 mg by mouth See admin instructions. Take 10 mg by mouth in the morning on Mon/Wed/Fri ONLY- before dialysis 08/31/16  Yes Rizwan, Eunice Blase, MD  Multiple Vitamin (MULTIVITAMIN WITH MINERALS) TABS tablet Take 1 tablet by mouth daily.   Yes [provider]  oxyCODONE-acetaminophen (PERCOCET/ROXICET) 5-325 MG tablet Take 1-2 tablets by mouth every 6 (six) hours as needed. Patient has not taken since started dialysis 08/2013 due to causes hypotension Patient taking differently: Take 2 tablets by mouth every 6 (six) hours as needed for moderate pain.  07/25/16  Yes Virgina Jock A, PA-C  pravastatin (PRAVACHOL) 40 MG tablet Take 40 mg by mouth every evening.  12/03/12  Yes Short, Noah Delaine, MD  Probiotic Product (PROBIOTIC DAILY PO) Take 1 capsule by mouth 2 (two) times daily.    Yes [provider]  sevelamer carbonate (RENVELA) 800 MG tablet Take 2,400 mg by mouth 3 (three) times daily with meals.   Yes [provider]  sucroferric oxyhydroxide (VELPHORO) 500 MG chewable tablet Chew 500-1,000 mg by mouth See admin instructions. Chew 1,000 mg by mouth with each meal and 500 mg with each snack   Yes [provider]  ciprofloxacin (CIPRO) 500 MG  tablet TAKE 1 TABLET BY MOUTH EVERY DAY Patient not taking: Reported on 01/27/2019 10/11/18   Tommy Medal, Lavell Islam, MD  doxycycline (VIBRA-TABS) 100 MG tablet Take 1 tablet (100 mg total) by mouth 2 (two) times daily. Patient not taking: Reported on 01/27/2019 09/20/18   Rai, Vernelle Emerald, MD  oxyCODONE (OXY IR/ROXICODONE) 5 MG immediate release tablet Take 1 tablet (5 mg total) by mouth every 6 (six) hours as needed (for pain score of 1-4). Patient not taking: Reported on 01/27/2019 10/10/18   Ulyses Amor, PA-C     [place]y Hora, [facility] Pager: [phone].  If no answer, [phone] 01/27/2019, 11:36 PM

## 2019-01-27 NOTE — Progress Notes (Signed)
ipap and epap settings were adjusted by MD.

## 2019-01-27 NOTE — Progress Notes (Signed)
Pt. Refused bipap. 

## 2019-01-27 NOTE — ED Notes (Signed)
Patient refused Bipap at this time. MD Melina Copa made aware.

## 2019-01-27 NOTE — ED Notes (Addendum)
Asked patient about a urine specimen via [employer], states "I don't produce urine anymore". MD made aware.

## 2019-01-27 NOTE — Telephone Encounter (Signed)
Mother states she just took her temperature and it was 101.0 without tylenol. Mother is worried about her getting septic.   Please advise.

## 2019-01-27 NOTE — ED Notes (Signed)
Bariatric bed ordered 

## 2019-01-27 NOTE — ED Notes (Signed)
MD made aware of BP

## 2019-01-27 NOTE — ED Triage Notes (Signed)
Patient BIB [facility] for SOB and lethargy today. Per EMS patient when to dialysis this morning but could not finish because "I didn't feel good". Family oral temp 102.7 and was given Tylenol. Patient is on 2L at home. Patient A&O x 4 but is slow to respond. Patient reports being treated for Sepsis last month.   BP 104/62 HR 130 RR 35-40

## 2019-01-27 NOTE — ED Notes (Signed)
Patient very uncomfortable, requesting to sit on the side of the bed so she can let her legs down. Informed patient we cannot do that at this time due to safety reasons. Patient informed we have ordered a bariatric bed.

## 2019-01-27 NOTE — ED Notes (Signed)
Move BP to patient's RIGHT upper arm,  90/60 (71) at this time.

## 2019-01-27 NOTE — ED Provider Notes (Signed)
[facility] EMERGENCY DEPARTMENT Provider Note   CSN: [id number] Arrival date & time:       History   Chief Complaint Chief Complaint  Patient presents with  . Shortness of Breath    HPI [name] is a 58 y.o. female.  Past medical history of end-stage renal disease, CHF, dilated cardiomyopathy, end-stage renal disease on hemodialysis Monday Wednesday Friday.  Patient was at dialysis today but did not complete her dialysis because she "did not feel well."  She has a history of chronic respiratory failure with hypoxia on 2 L via nasal cannula.  She has a history of previous episode of sepsis that caused cardiac arrest.  EMS brings the patient from home for fever, altered mental status..  Family called out because of worsening somnolence and difficulty breathing.  Is a level 5 caveat due to respiratory distress and altered mental status     HPI  Past Medical History:  Diagnosis Date  . Chronic diastolic CHF (congestive heart failure) ([place])   . DCM (dilated cardiomyopathy) ([place])    a. presumed nonischemic - no prior cath/nuc. EF 35-40% in 2011, most recently  60-65% by echo 2017.  Marland Kitchen Depression   . DM type 2 (diabetes mellitus, type 2) (H[facility])    diet controlledwith retinopathy and nephropathy  . ESRD on hemodialysis ([place]) 08/21/2017  . GERD (gastroesophageal reflux disease)   . H/O cardiac arrest 10/2012  . Hard of hearing 10/2012   now wears 2 hearing aids  . History of hemodialysis dec 2013  . HTN (hypertension)   . Hydradenitis   . Hyperkalemia   . Hyperlipidemia   . Hypothyroidism   . Iron deficiency anemia   . Morbid obesity ([place])   . Multinodular goiter   . Neuropathic pain 08/21/2017  . Orthostatic hypotension 06/27/2018  . PAF (paroxysmal atrial fibrillation) ([place])   . Paroxysmal atrial flutter ([place])   . Pneumonia Nov 10, 2012  . Sleep apnea    Intolerant to CPAP  . Tinnitus 12/26/2016  . Typical atrial flutter ([place]) 04/10/2017  . Urinary  tract infection    taking antibiotics for 3 days prior to surgery    Patient Active Problem List   Diagnosis Date Noted  . Anemia 09/17/2018  . Orthostatic hypotension 06/27/2018  . Neuropathic pain 08/21/2017  . Typical atrial flutter ([place]) 04/10/2017  . Persistent atrial fibrillation 02/04/2017  . Tinnitus 12/26/2016  . Streptococcal bacteremia   . ESRD (end stage renal disease) on dialysis ([place]) 08/25/2016  . Absolute anemia   . Pressure ulcer 05/01/2015  . Chronic respiratory failure ([place]) 11/10/2013  . COPD (chronic obstructive pulmonary disease) ([place]) 11/06/2013  . DCM (dilated cardiomyopathy) ([place])   . OSA (obstructive sleep apnea) 07/17/2013  . Chronic diastolic heart failure ([place]) 07/16/2013  . SOB (shortness of breath) 07/11/2013  . Hypertension 11/30/2012  . Hydronephrosis of left kidney 05/28/2012  . Nephrolithiasis 05/28/2012  . Hidradenitis suppurativa 05/21/2012  . Symptomatic anemia 05/21/2012  . DM type 2 (diabetes mellitus, type 2) ([place])   . Mood disorder in conditions classified elsewhere 09/06/2010  . ERYTHEMA NODOSUM, HX OF 07/27/2010  . GOITER, MULTINODULAR 05/18/2010  . Morbid obesity ([place]) 05/18/2010  . GERD 05/18/2010    Past Surgical History:  Procedure Laterality Date  . AV FISTULA PLACEMENT Left 07/25/2013   Procedure: ARTERIOVENOUS (AV) FISTULA CREATION ;  Surgeon: Rosetta Posner, MD;  Location: [facility];  Service: Vascular;  Laterality: Left;  . AV FISTULA PLACEMENT  Left 10/10/2018   Procedure: INSERTION OF LEFT UPPER ARM brachial to axillary artery ARTERIOVENOUS (AV) GORE-TEX GRAFT using 4-7 stretch graft;  Surgeon: Waynetta Sandy, MD;  Location: [facility];  Service: Vascular;  Laterality: Left;  . CYSTOSCOPY W/ RETROGRADES  11/16/2012   Procedure: CYSTOSCOPY WITH RETROGRADE PYELOGRAM;  Surgeon: Reece Packer, MD;  Location: [facility];  Service: Urology;  Laterality: Bilateral;  CYSTOSCOPY,BILATERAL RETROGRADE PYELOGRAM/ REMOVAL LEFT  URETERAL STENT/ FULGERATION BLADDER MUCOSA/ INSERTION RIGHT URETERAL STENT  . CYSTOSCOPY W/ URETERAL STENT PLACEMENT  05/28/2012   Procedure: CYSTOSCOPY WITH RETROGRADE PYELOGRAM/URETERAL STENT PLACEMENT;  Surgeon: Reece Packer, MD;  Location: [facility];  Service: Urology;  Laterality: Left;  . CYSTOSCOPY WITH RETROGRADE PYELOGRAM, URETEROSCOPY AND STENT PLACEMENT Right 06/23/2013   Procedure: CYSTOSCOPY WITH RIGHT URETEROSCOPY, RIGHT  RETROGRADE PYELOGRAM, WITH LASER LIPOTRIPSY AND RIGHT URETERAL STENT EXCHANGE ;  Surgeon: Molli Hazard, MD;  Location: [facility];  Service: Urology;  Laterality: Right;  STENT EXCHANGE    . CYSTOSCOPY WITH RETROGRADE PYELOGRAM, URETEROSCOPY AND STENT PLACEMENT Right 11/06/2013   Procedure: CYSTOSCOPY WITH RETROGRADE PYELOGRAM, URETEROSCOPY STONE REMOVAL AND STENT REMOVAL;  Surgeon: Molli Hazard, MD;  Location: [facility];  Service: Urology;  Laterality: Right;  . HOLMIUM LASER APPLICATION N/A 9/32/3557   Procedure: HOLMIUM LASER APPLICATION;  Surgeon: Molli Hazard, MD;  Location: [facility];  Service: Urology;  Laterality: N/A;  . INSERTION OF DIALYSIS CATHETER N/A 09/24/2013   Procedure: INSERTION OF DIALYSIS CATHETER;  Surgeon: Rosetta Posner, MD;  [facility];  Service: Vascular;  Laterality: N/A;  . IR AV DIALY SHUNT INTRO NEEDLE/INTRAC INITIAL W/PTA/STENT/IMG LT Left 08/13/2018  . IR AV DIALY SHUNT INTRO NEEDLE/INTRACATH INITIAL W/PTA/IMG LEFT  04/19/2018  . IR AV DIALY SHUNT INTRO NEEDLE/INTRACATH INITIAL W/PTA/IMG LEFT  08/14/2018  . IR FLUORO GUIDE CV LINE RIGHT  09/17/2018  . IR FLUORO GUIDE CV LINE RIGHT  09/20/2018  . IR US GUIDE VASC ACCESS LEFT  04/19/2018  . IR US GUIDE VASC ACCESS LEFT  08/13/2018  . IR US GUIDE VASC ACCESS LEFT  08/14/2018  . IR US GUIDE VASC ACCESS RIGHT  09/17/2018  . PORT-A-CATH REMOVAL    . PORTACATH PLACEMENT    . REVISON OF ARTERIOVENOUS FISTULA Left 07/25/2016   Procedure: REVISON OF LEFT ARTERIOVENOUS  FISTULA;  Surgeon: Elam Dutch, MD;  Location: [facility];  Service: Vascular;  Laterality: Left;  . THROMBECTOMY AND REVISION OF ARTERIOVENTOUS (AV) GORETEX  GRAFT Left 10/10/2018   Procedure: attempted of THROMBECTOMY OF ARTERIOVENTOUS FISTULA LEFT ARM;  Surgeon: Waynetta Sandy, MD;  Location: [facility];  Service: Vascular;  Laterality: Left;     OB History   No obstetric history on file.      Home Medications    Prior to Admission medications   Medication Sig Start Date End Date Taking? Authorizing Provider  acetaminophen (TYLENOL) 500 MG tablet Take 1,000 mg by mouth daily as needed for moderate pain.    [provider]  aspirin EC 81 MG tablet Take 81 mg by mouth at bedtime.     [provider]  B Complex-C-Folic Acid (NEPHRO-VITE PO) Take 1 tablet by mouth daily.    [provider]  Calcium Acetate 667 MG TABS Take 667 mg by mouth See admin instructions. Take 667 mg by mouth three times a day with meals and 667 mg with each snack    [provider]  ciprofloxacin (CIPRO) 500 MG tablet TAKE 1  TABLET BY MOUTH EVERY DAY 10/11/18   Tommy Medal, Lavell Islam, MD  [place] (SODIUM HYPOCHLORITE) 0.25 % SOLN Apply 1 application topically 2 (two) times daily. Apply to wound    [provider]  diltiazem (CARDIZEM) 30 MG tablet Take 1 tablet every 4 hours AS NEEDED for rapid heart rate lasting more than 30 mins as long as top blood pressure >100. Patient taking differently: Take 30 mg by mouth every 4 (four) hours as needed (FOR A RAPID HEART RATE LASTING MORE THAN 30 MINUTES AS LONG AS SYSTOLIC ("top number") READING IS >100).  03/09/17   Sherran Needs, NP  diphenhydrAMINE (BENADRYL) 25 MG tablet Take 25 mg by mouth daily as needed for allergies.    [provider]  doxycycline (VIBRA-TABS) 100 MG tablet Take 1 tablet (100 mg total) by mouth 2 (two) times daily. 09/20/18   Rai, Ripudeep Raliegh Ip, MD  DULoxetine (CYMBALTA) 60 MG capsule Take 1  capsule (60 mg total) by mouth daily. 12/03/12   Janece Canterbury, MD  famotidine (PEPCID) 20 MG tablet TAKE 1 TABLET BY MOUTH AT BEDTIME Patient taking differently: Take 20 mg by mouth at bedtime 10/15/14   Tanda Rockers, MD  feeding supplement, RESOURCE BREEZE, (RESOURCE BREEZE) LIQD Take 1 Container by mouth daily. Between a meal    [provider]  glipiZIDE (GLUCOTROL) 5 MG tablet Take 0.5 tablets (2.5 mg total) by mouth daily. 08/31/16   Debbe Odea, MD  levothyroxine (SYNTHROID, LEVOTHROID) 75 MCG tablet Take 75 mcg by mouth daily before breakfast.  08/17/15   [provider]  midodrine (PROAMATINE) 10 MG tablet Take 1 tablet (10 mg total) by mouth 3 (three) times daily. Patient taking differently: Take 10 mg by mouth 2 (two) times daily.  08/31/16   Debbe Odea, MD  Multiple Vitamin (MULTIVITAMIN WITH MINERALS) TABS tablet Take 1 tablet by mouth daily.    [provider]  oxyCODONE (OXY IR/ROXICODONE) 5 MG immediate release tablet Take 1 tablet (5 mg total) by mouth every 6 (six) hours as needed (for pain score of 1-4). 10/10/18   Ulyses Amor, PA-C  oxyCODONE-acetaminophen (PERCOCET/ROXICET) 5-325 MG tablet Take 1-2 tablets by mouth every 6 (six) hours as needed. Patient has not taken since started dialysis 08/2013 due to causes hypotension Patient taking differently: Take 2 tablets by mouth every 6 (six) hours as needed for moderate pain.  07/25/16   Alvia [place], PA-C  pravastatin (PRAVACHOL) 40 MG tablet Take 40 mg by mouth at bedtime.  12/03/12   Janece Canterbury, MD  Probiotic Product (PROBIOTIC DAILY PO) Take 1 capsule by mouth 2 (two) times daily.     [provider]  sevelamer carbonate (RENVELA) 800 MG tablet Take 2,400 mg by mouth 3 (three) times daily with meals.    [provider]  sucroferric oxyhydroxide (VELPHORO) 500 MG chewable tablet Chew 500-1,000 mg by mouth See admin instructions. Take 1000 mg with each meal and 500 mg  with each snack    [provider]    Family History Family History  Problem Relation Age of Onset  . Coronary artery disease Mother   . Hypertension Mother   . Diabetes type II Mother   . Coronary artery disease Father   . Hypertension Father   . Malignant hyperthermia Father   . Cancer Maternal Grandfather        ? Type  . Kidney failure Maternal Grandmother     Social History Social History  Tobacco Use  . Smoking status: Former Smoker    Packs/day: 0.25    Years: 1.00    Pack years: 0.25    Types: Cigarettes    Last attempt to quit: 11/28/1979    Years since quitting: 39.1  . Smokeless tobacco: Never Used  Substance Use Topics  . Alcohol use: No  . Drug use: No     Allergies   Rosiglitazone; Amoxicillin; and Amoxicillin-pot clavulanate   Review of Systems Review of Systems  Unable to perform ROS: Acuity of condition     Physical Exam Updated Vital Signs LMP 06/13/2013   Physical Exam Vitals signs and nursing note reviewed.  Constitutional:      General: She is not in acute distress.    Appearance: She is obese. She is not diaphoretic.     Comments: Morbidly obese female in respiratory distress. Somnolent  HENT:     Head: Normocephalic and atraumatic.  Eyes:     General: No scleral icterus.    Conjunctiva/sclera: Conjunctivae normal.  Neck:     Musculoskeletal: Normal range of motion.  Cardiovascular:     Rate and Rhythm: Normal rate and regular rhythm.     Heart sounds: Normal heart sounds. No murmur. No friction rub. No gallop.   Pulmonary:     Effort: Tachypnea and respiratory distress present.     Breath sounds: Normal breath sounds.     Comments: Exam of the lungs limited by body habitus Abdominal:     General: Bowel sounds are normal. There is no distension.     Palpations: Abdomen is soft. There is no mass.     Tenderness: There is no abdominal tenderness. There is no guarding.  Skin:    General: Skin is warm and dry.      Comments: + malodorous intertrigo and tissue ulceration of under the pannus  Neurological:     Mental Status: She is oriented to person, place, and time.  Psychiatric:        Behavior: Behavior normal.      ED Treatments / Results  Labs (all labs ordered are listed, but only abnormal results are displayed) Labs Reviewed  CULTURE, BLOOD (ROUTINE X 2)  CULTURE, BLOOD (ROUTINE X 2)  LACTIC ACID, PLASMA  LACTIC ACID, PLASMA  COMPREHENSIVE METABOLIC PANEL  CBC WITH DIFFERENTIAL/PLATELET  URINALYSIS, ROUTINE W REFLEX MICROSCOPIC    EKG None  Radiology No results found.  Procedures .Critical Care Performed by: Margarita Mail, PA-C Authorized by: Margarita Mail, PA-C   Critical care provider statement:    Critical care time (minutes):  60   Critical care was necessary to treat or prevent imminent or life-threatening deterioration of the following conditions:  Respiratory failure and sepsis   Critical care was time spent personally by me on the following activities:  Discussions with consultants, evaluation of patient's response to treatment, examination of patient, ordering and performing treatments and interventions, ordering and review of laboratory studies, ordering and review of radiographic studies, pulse oximetry, re-evaluation of patient's condition, obtaining history from patient or surrogate and review of old charts   (including critical care time)  Medications Ordered in ED Medications  ceFEPIme (MAXIPIME) 2 g in sodium chloride 0.9 % 100 mL IVPB (has no administration in time range)  metroNIDAZOLE (FLAGYL) IVPB 500 mg (has no administration in time range)  vancomycin (VANCOCIN) IVPB 1000 mg/200 mL premix (has no administration in time range)     Initial Impression / Assessment and Plan / ED Course  I have reviewed the triage vital signs and the nursing notes.  Pertinent labs & imaging results that were available during my care of the patient were reviewed by me  and considered in my medical decision making (see chart for details).  Clinical Course as of Jan 26 2153  Mon Jan 27, 3451  [id number] 58 year old female end-stage renal disease here after needing to leave dialysis early for not feeling well.  She is tachycardic tachypneic hypotensive with a fever of 103.5.  She is getting some antibiotics and Tylenol and blood work chest x-ray.  She will need to be admitted to the hospital.   [MB]  2136 WBC(!): 33.0 [AH]  2137 Lactic Acid, Venous(!!): 2.8 [AH]  2137 NEUT#(!): 31.1 [AH]  2137 Potassium: 4.2 [AH]    Clinical Course User Index [AH] Margarita Mail, PA-C [MB] Hayden Rasmussen, MD       Patient here with sepsis, respiratory distress, history of sepsis and cardiac arrest.  Patient placed on BiPAP but was still breathing about 30 times a minute. She has had soft pressures and did not fully complete her dialysis tx today.  She also has multiple sources of infection including chronic hidradenitis sores under her pannus.  Patient seen by Dr. Alcario Drought who asked for critical care's consult.  She was seen here by the critical care team who adjusted her vent settings.  Given her blood gas results which are excellent do not feel that she needs to be intubated but do feel that this is likely secondary to volume overload.  Review of chest x-ray also shows she may have a pneumonia.  White blood cell count is markedly elevated.  Patient will need hemodialysis tomorrow.  Her current respiratory rate on BiPAP is 20 and she is improving.  Final Clinical Impressions(s) / ED Diagnoses   Final diagnoses:  None    ED Discharge Orders    None       Margarita Mail, PA-C 01/28/19 0013    Hayden Rasmussen, MD 01/28/19 1754

## 2019-01-27 NOTE — ED Notes (Signed)
RN contacted Pt's RN to see if Pt can receive visitors, RN informed to wait

## 2019-01-27 NOTE — Progress Notes (Signed)
Pharmacy Antibiotic Note  [name] is a 58 y.o. female admitted on 01/27/2019 with sepsis.  Pharmacy has been consulted for cefepime and vancomycin dosing. Patient has history of ESRD on HD MWF. Patient febrile on admission with Tmax 103.5, RR 25 and HR 117 bpm.      Plan: Cefepime 1g IV q24h Vancomycin 2500 mg IV x 1, then 1000 mg IV qHD Monitor C&S, CBC, clinical progression Obtain vancomycin level as needed    No data recorded.  No results for input(s): WBC, CREATININE, LATICACIDVEN, VANCOTROUGH, VANCOPEAK, VANCORANDOM, GENTTROUGH, GENTPEAK, GENTRANDOM, TOBRATROUGH, TOBRAPEAK, TOBRARND, AMIKACINPEAK, AMIKACINTROU, AMIKACIN in the last 168 hours.  CrCl cannot be calculated (Patient's most recent lab result is older than the maximum 21 days allowed.).    Allergies  Allergen Reactions  . Rosiglitazone Other (See Comments)    "Avandia" = Reaction not recalled  . Amoxicillin Rash and Other (See Comments)    Tolerated Zosyn 01/2013. [place] Has patient had a PCN reaction causing immediate rash, facial/tongue/throat swelling, SOB or lightheadedness with hypotension: Yes Has patient had a PCN reaction causing severe rash involving mucus membranes or skin necrosis: No Has patient had a PCN reaction that required hospitalization: No Has patient had a PCN reaction occurring within the last 10 years: No If all of the above answers are "NO", then may proceed with Cephalosporin use.   Marland Kitchen Amoxicillin-Pot Clavulanate Diarrhea    Antimicrobials this admission: Vancomycin 3/2 >>  Cefepime 3/2 >>  Metronidazole 3/2 >>  Dose adjustments this admission: N/A  Microbiology results: 3/2 BCx:   Thank you for allowing pharmacy to be a part of this patient's care.  Britt Boozer 01/27/2019 8:10 PM

## 2019-01-27 NOTE — ED Notes (Signed)
Staff have tried numerous times to reposition patient to the center of the bed. She refuses at this time, states "it's too painful". Patient leaning the the side rail.

## 2019-01-28 ENCOUNTER — Inpatient Hospital Stay (HOSPITAL_COMMUNITY): Payer: Medicare Other

## 2019-01-28 ENCOUNTER — Encounter (HOSPITAL_COMMUNITY): Payer: Self-pay | Admitting: Nephrology

## 2019-01-28 DIAGNOSIS — N186 End stage renal disease: Secondary | ICD-10-CM | POA: Diagnosis present

## 2019-01-28 DIAGNOSIS — A419 Sepsis, unspecified organism: Secondary | ICD-10-CM | POA: Diagnosis not present

## 2019-01-28 DIAGNOSIS — R6520 Severe sepsis without septic shock: Secondary | ICD-10-CM

## 2019-01-28 DIAGNOSIS — G92 Toxic encephalopathy: Secondary | ICD-10-CM | POA: Diagnosis present

## 2019-01-28 DIAGNOSIS — N2581 Secondary hyperparathyroidism of renal origin: Secondary | ICD-10-CM | POA: Diagnosis present

## 2019-01-28 DIAGNOSIS — E039 Hypothyroidism, unspecified: Secondary | ICD-10-CM | POA: Diagnosis present

## 2019-01-28 DIAGNOSIS — Z6841 Body Mass Index (BMI) 40.0 and over, adult: Secondary | ICD-10-CM | POA: Diagnosis not present

## 2019-01-28 DIAGNOSIS — G9340 Encephalopathy, unspecified: Secondary | ICD-10-CM

## 2019-01-28 DIAGNOSIS — E1122 Type 2 diabetes mellitus with diabetic chronic kidney disease: Secondary | ICD-10-CM | POA: Diagnosis not present

## 2019-01-28 DIAGNOSIS — Y95 Nosocomial condition: Secondary | ICD-10-CM | POA: Diagnosis present

## 2019-01-28 DIAGNOSIS — R0603 Acute respiratory distress: Secondary | ICD-10-CM | POA: Diagnosis not present

## 2019-01-28 DIAGNOSIS — H9190 Unspecified hearing loss, unspecified ear: Secondary | ICD-10-CM | POA: Diagnosis present

## 2019-01-28 DIAGNOSIS — I420 Dilated cardiomyopathy: Secondary | ICD-10-CM

## 2019-01-28 DIAGNOSIS — R0602 Shortness of breath: Secondary | ICD-10-CM | POA: Diagnosis not present

## 2019-01-28 DIAGNOSIS — I4819 Other persistent atrial fibrillation: Secondary | ICD-10-CM | POA: Diagnosis present

## 2019-01-28 DIAGNOSIS — G4733 Obstructive sleep apnea (adult) (pediatric): Secondary | ICD-10-CM

## 2019-01-28 DIAGNOSIS — E11319 Type 2 diabetes mellitus with unspecified diabetic retinopathy without macular edema: Secondary | ICD-10-CM | POA: Diagnosis present

## 2019-01-28 DIAGNOSIS — I9589 Other hypotension: Secondary | ICD-10-CM | POA: Diagnosis not present

## 2019-01-28 DIAGNOSIS — J449 Chronic obstructive pulmonary disease, unspecified: Secondary | ICD-10-CM | POA: Diagnosis present

## 2019-01-28 DIAGNOSIS — J9621 Acute and chronic respiratory failure with hypoxia: Secondary | ICD-10-CM | POA: Diagnosis present

## 2019-01-28 DIAGNOSIS — I5032 Chronic diastolic (congestive) heart failure: Secondary | ICD-10-CM | POA: Diagnosis present

## 2019-01-28 DIAGNOSIS — J189 Pneumonia, unspecified organism: Secondary | ICD-10-CM | POA: Diagnosis present

## 2019-01-28 DIAGNOSIS — Z992 Dependence on renal dialysis: Secondary | ICD-10-CM

## 2019-01-28 DIAGNOSIS — I132 Hypertensive heart and chronic kidney disease with heart failure and with stage 5 chronic kidney disease, or end stage renal disease: Secondary | ICD-10-CM | POA: Diagnosis present

## 2019-01-28 DIAGNOSIS — F329 Major depressive disorder, single episode, unspecified: Secondary | ICD-10-CM | POA: Diagnosis present

## 2019-01-28 DIAGNOSIS — K219 Gastro-esophageal reflux disease without esophagitis: Secondary | ICD-10-CM | POA: Diagnosis present

## 2019-01-28 DIAGNOSIS — L89159 Pressure ulcer of sacral region, unspecified stage: Secondary | ICD-10-CM | POA: Diagnosis present

## 2019-01-28 DIAGNOSIS — D631 Anemia in chronic kidney disease: Secondary | ICD-10-CM | POA: Diagnosis present

## 2019-01-28 DIAGNOSIS — J810 Acute pulmonary edema: Secondary | ICD-10-CM | POA: Diagnosis present

## 2019-01-28 LAB — BASIC METABOLIC PANEL
Anion gap: 10 (ref 5–15)
BUN: 44 mg/dL — ABNORMAL HIGH (ref 6–20)
CALCIUM: 8.7 mg/dL — AB (ref 8.9–10.3)
CO2: 23 mmol/L (ref 22–32)
Chloride: 98 mmol/L (ref 98–111)
Creatinine, Ser: 7.63 mg/dL — ABNORMAL HIGH (ref 0.44–1.00)
GFR calc Af Amer: 6 mL/min — ABNORMAL LOW (ref >60)
GFR calc non Af Amer: 5 mL/min — ABNORMAL LOW (ref >60)
Glucose, Bld: 199 mg/dL — ABNORMAL HIGH (ref 70–99)
Potassium: 4.7 mmol/L (ref 3.5–5.1)
SODIUM: 131 mmol/L — AB (ref 135–145)

## 2019-01-28 LAB — TROPONIN I: Troponin I: 0.05 ng/mL (ref <0.03)

## 2019-01-28 LAB — CBC
HCT: 33.4 % — ABNORMAL LOW (ref 36.0–46.0)
Hemoglobin: 9.1 g/dL — ABNORMAL LOW (ref 12.0–15.0)
MCH: 28.3 pg (ref 26.0–34.0)
MCHC: 27.2 g/dL — ABNORMAL LOW (ref 30.0–36.0)
MCV: 103.7 fL — ABNORMAL HIGH (ref 80.0–100.0)
Platelets: 297 10*3/uL (ref 150–400)
RBC: 3.22 MIL/uL — ABNORMAL LOW (ref 3.87–5.11)
RDW: 18.1 % — AB (ref 11.5–15.5)
WBC: 37.9 10*3/uL — ABNORMAL HIGH (ref 4.0–10.5)
nRBC: 0.1 % (ref 0.0–0.2)

## 2019-01-28 LAB — CBG MONITORING, ED
Glucose-Capillary: 110 mg/dL — ABNORMAL HIGH (ref 70–99)
Glucose-Capillary: 149 mg/dL — ABNORMAL HIGH (ref 70–99)
Glucose-Capillary: 226 mg/dL — ABNORMAL HIGH (ref 70–99)

## 2019-01-28 LAB — ECHOCARDIOGRAM COMPLETE

## 2019-01-28 LAB — LACTIC ACID, PLASMA
Lactic Acid, Venous: 2.5 mmol/L (ref 0.5–1.9)
Lactic Acid, Venous: 2.6 mmol/L (ref 0.5–1.9)

## 2019-01-28 LAB — BRAIN NATRIURETIC PEPTIDE: B NATRIURETIC PEPTIDE 5: 250.6 pg/mL — AB (ref 0.0–100.0)

## 2019-01-28 LAB — GLUCOSE, CAPILLARY: Glucose-Capillary: 144 mg/dL — ABNORMAL HIGH (ref 70–99)

## 2019-01-28 MED ORDER — LEVOTHYROXINE SODIUM 75 MCG PO TABS
75.0000 ug | ORAL_TABLET | Freq: Every day | ORAL | Status: DC
  Administered 2019-01-28 – 2019-01-31 (×4): 75 ug via ORAL
  Filled 2019-01-28 (×4): qty 1

## 2019-01-28 MED ORDER — SEVELAMER CARBONATE 800 MG PO TABS
2400.0000 mg | ORAL_TABLET | Freq: Three times a day (TID) | ORAL | Status: DC
  Administered 2019-01-28 – 2019-01-31 (×7): 2400 mg via ORAL
  Filled 2019-01-28 (×7): qty 3

## 2019-01-28 MED ORDER — DULOXETINE HCL 60 MG PO CPEP
60.0000 mg | ORAL_CAPSULE | Freq: Every day | ORAL | Status: DC
  Administered 2019-01-28 – 2019-01-31 (×4): 60 mg via ORAL
  Filled 2019-01-28 (×4): qty 1

## 2019-01-28 MED ORDER — PRAVASTATIN SODIUM 40 MG PO TABS
40.0000 mg | ORAL_TABLET | Freq: Every evening | ORAL | Status: DC
  Administered 2019-01-28 – 2019-01-30 (×3): 40 mg via ORAL
  Filled 2019-01-28 (×3): qty 1

## 2019-01-28 MED ORDER — LEVOTHYROXINE SODIUM 100 MCG/5ML IV SOLN
37.5000 ug | Freq: Every day | INTRAVENOUS | Status: DC
  Administered 2019-01-28: 37.5 ug via INTRAVENOUS
  Filled 2019-01-28: qty 5

## 2019-01-28 MED ORDER — RENA-VITE PO TABS
1.0000 | ORAL_TABLET | Freq: Every day | ORAL | Status: DC
  Administered 2019-01-28 – 2019-01-31 (×3): 1 via ORAL
  Filled 2019-01-28 (×3): qty 1

## 2019-01-28 MED ORDER — ACETAMINOPHEN 10 MG/ML IV SOLN
1000.0000 mg | Freq: Four times a day (QID) | INTRAVENOUS | Status: DC | PRN
  Administered 2019-01-28 (×2): 1000 mg via INTRAVENOUS
  Filled 2019-01-28 (×3): qty 100

## 2019-01-28 MED ORDER — NEPHRO-VITE 0.8 MG PO TABS
1.0000 | ORAL_TABLET | Freq: Every day | ORAL | Status: DC
  Filled 2019-01-28: qty 1

## 2019-01-28 MED ORDER — OXYCODONE HCL 5 MG PO TABS
5.0000 mg | ORAL_TABLET | Freq: Four times a day (QID) | ORAL | Status: DC | PRN
  Administered 2019-01-28 – 2019-01-29 (×2): 5 mg via ORAL
  Filled 2019-01-28 (×3): qty 1

## 2019-01-28 MED ORDER — ACETAMINOPHEN 325 MG PO TABS
650.0000 mg | ORAL_TABLET | Freq: Four times a day (QID) | ORAL | Status: DC | PRN
  Administered 2019-01-28: 650 mg via ORAL
  Filled 2019-01-28: qty 2

## 2019-01-28 MED ORDER — CALCIUM ACETATE (PHOS BINDER) 667 MG PO CAPS
667.0000 mg | ORAL_CAPSULE | Freq: Three times a day (TID) | ORAL | Status: DC
  Administered 2019-01-28 – 2019-01-31 (×7): 667 mg via ORAL
  Filled 2019-01-28 (×7): qty 1

## 2019-01-28 MED ORDER — SUCROFERRIC OXYHYDROXIDE 500 MG PO CHEW
1000.0000 mg | CHEWABLE_TABLET | Freq: Three times a day (TID) | ORAL | Status: DC
  Administered 2019-01-28 – 2019-01-31 (×7): 1000 mg via ORAL
  Filled 2019-01-28 (×10): qty 2

## 2019-01-28 MED ORDER — SODIUM CHLORIDE 0.9 % IV SOLN: 250.0000 mL | INTRAVENOUS | Status: DC | PRN

## 2019-01-28 MED ORDER — SODIUM CHLORIDE 0.9% FLUSH
3.0000 mL | Freq: Two times a day (BID) | INTRAVENOUS | Status: DC
  Administered 2019-01-28 – 2019-01-31 (×8): 3 mL via INTRAVENOUS

## 2019-01-28 MED ORDER — HEPARIN SODIUM (PORCINE) 5000 UNIT/ML IJ SOLN
5000.0000 [IU] | Freq: Three times a day (TID) | INTRAMUSCULAR | Status: DC
  Administered 2019-01-28 – 2019-01-30 (×7): 5000 [IU] via SUBCUTANEOUS
  Filled 2019-01-28 (×7): qty 1

## 2019-01-28 MED ORDER — SODIUM CHLORIDE 0.9% FLUSH: 3.0000 mL | INTRAVENOUS | Status: DC | PRN

## 2019-01-28 MED ORDER — ASPIRIN EC 81 MG PO TBEC
81.0000 mg | DELAYED_RELEASE_TABLET | Freq: Every day | ORAL | Status: DC
  Administered 2019-01-28 – 2019-01-31 (×3): 81 mg via ORAL
  Filled 2019-01-28 (×3): qty 1

## 2019-01-28 MED ORDER — CHLORHEXIDINE GLUCONATE CLOTH 2 % EX PADS
6.0000 | MEDICATED_PAD | Freq: Every day | CUTANEOUS | Status: DC
  Administered 2019-01-29 – 2019-01-30 (×2): 6 via TOPICAL

## 2019-01-28 MED ORDER — ONDANSETRON HCL 4 MG/2ML IJ SOLN: 4.0000 mg | Freq: Four times a day (QID) | INTRAMUSCULAR | Status: DC | PRN

## 2019-01-28 MED ORDER — ONDANSETRON HCL 4 MG PO TABS: 4.0000 mg | ORAL_TABLET | Freq: Four times a day (QID) | ORAL | Status: DC | PRN

## 2019-01-28 MED ORDER — MIDODRINE HCL 5 MG PO TABS
10.0000 mg | ORAL_TABLET | ORAL | Status: DC
  Administered 2019-01-29 – 2019-01-30 (×2): 10 mg via ORAL
  Filled 2019-01-28: qty 2

## 2019-01-28 MED ORDER — FAMOTIDINE 20 MG PO TABS
20.0000 mg | ORAL_TABLET | Freq: Every day | ORAL | Status: DC
  Administered 2019-01-28 – 2019-01-31 (×3): 20 mg via ORAL
  Filled 2019-01-28 (×3): qty 1

## 2019-01-28 NOTE — H&P (Signed)
History and Physical    [name] VOH:[id number] DOB: 1961-06-23 DOA: 01/27/2019  PCP: Harlan Stains, MD  Patient coming from: Home  I have personally briefly reviewed patient's old medical records in [place]  Chief Complaint: Fever, Resp distress  HPI: [name] is a 58 y.o. female with medical history significant of DM2, ESRD on MWF dialysis, OSA intolerant to CPAP, morbid obesity, PAF, h/o NICM EF back to 60-65% on 2017 echo, prior cardiac arrest with sepsis in past, hidradenitis.  Patient presents to the ED with c/o respiratory distress and lethargy.  Patient was at dialysis today but did not complete her dialysis because she "did not feel well."  She has a history of chronic respiratory failure with hypoxia on 2 L via nasal cannula.  She has a history of previous episode of sepsis that caused cardiac arrest.  EMS brings the patient from home for fever, altered mental status..  Family called out because of worsening somnolence and difficulty breathing.   ED Course: In the ED has Tm 103.5, HR 110s, is in respiratory distress and is put on rescue BIPAP.  WBC 33k, CXR shows possible asymetric edema.  Patient noted to have significant skin break down below panus with purulent drainage as well.  Started on Cefepime, Vanc, flagyl empirically for sepsis.  After she was still breathing 30-40 times / min on rescue BIPAP, PCCM was consulted and evaluated patient.  Adjusted BIPAP settings, didn't feel she needed to go to ICU.   Review of Systems: As per HPI otherwise 10 point review of systems negative.   Past Medical History:  Diagnosis Date  . Chronic diastolic CHF (congestive heart failure) ([place])   . DCM (dilated cardiomyopathy) ([place])    a. presumed nonischemic - no prior cath/nuc. EF 35-40% in 2011, most recently  60-65% by echo 2017.  Marland Kitchen Depression   . DM type 2 (diabetes mellitus, type 2) (HCC)    diet controlledwith retinopathy and nephropathy  . ESRD on  hemodialysis ([place]) 08/21/2017  . GERD (gastroesophageal reflux disease)   . H/O cardiac arrest 10/2012  . Hard of hearing 10/2012   now wears 2 hearing aids  . History of hemodialysis dec 2013  . HTN (hypertension)   . Hydradenitis   . Hyperkalemia   . Hyperlipidemia   . Hypothyroidism   . Iron deficiency anemia   . Morbid obesity ([place])   . Multinodular goiter   . Neuropathic pain 08/21/2017  . Orthostatic hypotension 06/27/2018  . PAF (paroxysmal atrial fibrillation) ([place])   . Paroxysmal atrial flutter ([place])   . Pneumonia Nov 10, 2012  . Sleep apnea    Intolerant to CPAP  . Tinnitus 12/26/2016  . Typical atrial flutter ([place]) 04/10/2017  . Urinary tract infection    taking antibiotics for 3 days prior to surgery    Past Surgical History:  Procedure Laterality Date  . AV FISTULA PLACEMENT Left 07/25/2013   Procedure: ARTERIOVENOUS (AV) FISTULA CREATION ;  Surgeon: Rosetta Posner, MD;  Location: [facility];  Service: Vascular;  Laterality: Left;  . AV FISTULA PLACEMENT Left 10/10/2018   Procedure: INSERTION OF LEFT UPPER ARM brachial to axillary artery ARTERIOVENOUS (AV) GORE-TEX GRAFT using 4-7 stretch graft;  Surgeon: Waynetta Sandy, MD;  Location: [facility];  Service: Vascular;  Laterality: Left;  . CYSTOSCOPY W/ RETROGRADES  11/16/2012   Procedure: CYSTOSCOPY WITH RETROGRADE PYELOGRAM;  Surgeon: Reece Packer, MD;  Location[facility];  Service: Urology;  Laterality:  Bilateral;  CYSTOSCOPY,BILATERAL RETROGRADE PYELOGRAM/ REMOVAL LEFT URETERAL STENT/ FULGERATION BLADDER MUCOSA/ INSERTION RIGHT URETERAL STENT  . CYSTOSCOPY W/ URETERAL STENT PLACEMENT  05/28/2012   Procedure: CYSTOSCOPY WITH RETROGRADE PYELOGRAM/URETERAL STENT PLACEMENT;  Surgeon: Reece Packer, MD;  [facility];  Service: Urology;  Laterality: Left;  . CYSTOSCOPY WITH RETROGRADE PYELOGRAM, URETEROSCOPY AND STENT PLACEMENT Right 06/23/2013   Procedure: CYSTOSCOPY WITH RIGHT URETEROSCOPY, RIGHT  RETROGRADE  PYELOGRAM, WITH LASER LIPOTRIPSY AND RIGHT URETERAL STENT EXCHANGE ;  Surgeon: Molli Hazard, MD;  [facility];  Service: Urology;  Laterality: Right;  STENT EXCHANGE    . CYSTOSCOPY WITH RETROGRADE PYELOGRAM, URETEROSCOPY AND STENT PLACEMENT Right 11/06/2013   Procedure: CYSTOSCOPY WITH RETROGRADE PYELOGRAM, URETEROSCOPY STONE REMOVAL AND STENT REMOVAL;  Surgeon: Molli Hazard, MD;  [facility];  Service: Urology;  Laterality: Right;  . HOLMIUM LASER APPLICATION N/A 2/99/2426   Procedure: HOLMIUM LASER APPLICATION;  Surgeon: Molli Hazard, MD;  [facility];  Service: Urology;  Laterality: N/A;  . INSERTION OF DIALYSIS CATHETER N/A 09/24/2013   Procedure: INSERTION OF DIALYSIS CATHETER;  Surgeon: Rosetta Posner, MD;  Location[facility];  Service: Vascular;  Laterality: N/A;  . IR AV DIALY SHUNT INTRO NEEDLE/INTRAC INITIAL W/PTA/STENT/IMG LT Left 08/13/2018  . IR AV DIALY SHUNT INTRO NEEDLE/INTRACATH INITIAL W/PTA/IMG LEFT  04/19/2018  . IR AV DIALY SHUNT INTRO NEEDLE/INTRACATH INITIAL W/PTA/IMG LEFT  08/14/2018  . IR FLUORO GUIDE CV LINE RIGHT  09/17/2018  . IR FLUORO GUIDE CV LINE RIGHT  09/20/2018  . IR US GUIDE VASC ACCESS LEFT  04/19/2018  . IR US GUIDE VASC ACCESS LEFT  08/13/2018  . IR US GUIDE VASC ACCESS LEFT  08/14/2018  . IR US GUIDE VASC ACCESS RIGHT  09/17/2018  . PORT-A-CATH REMOVAL    . PORTACATH PLACEMENT    . REVISON OF ARTERIOVENOUS FISTULA Left 07/25/2016   Procedure: REVISON OF LEFT ARTERIOVENOUS FISTULA;  Surgeon: Elam Dutch, MD;  Location: [facility];  Service: Vascular;  Laterality: Left;  . THROMBECTOMY AND REVISION OF ARTERIOVENTOUS (AV) GORETEX  GRAFT Left 10/10/2018   Procedure: attempted of THROMBECTOMY OF ARTERIOVENTOUS FISTULA LEFT ARM;  Surgeon: Waynetta Sandy, MD;  Location: [facility];  Service: Vascular;  Laterality: Left;     reports that she quit smoking about 39 years ago. Her smoking use included cigarettes. She has a  0.25 pack-year smoking history. She has never used smokeless tobacco. She reports that she does not drink alcohol or use drugs.  Allergies  Allergen Reactions  . Rosiglitazone Other (See Comments)    "Avandia" = Reaction not recalled  . Amoxicillin Rash and Other (See Comments)    Tolerated Zosyn 01/2013. [place] Has patient had a PCN reaction causing immediate rash, facial/tongue/throat swelling, SOB or lightheadedness with hypotension: Yes Has patient had a PCN reaction causing severe rash involving mucus membranes or skin necrosis: No Has patient had a PCN reaction that required hospitalization: No Has patient had a PCN reaction occurring within the last 10 years: No If all of the above answers are "NO", then may proceed with Cephalosporin use.   Marland Kitchen Amoxicillin-Pot Clavulanate Diarrhea    Family History  Problem Relation Age of Onset  . Coronary artery disease Mother   . Hypertension Mother   . Diabetes type II Mother   . Coronary artery disease Father   . Hypertension Father   . Malignant hyperthermia Father   . Cancer Maternal Grandfather        ?  Type  . Kidney failure Maternal Grandmother      Prior to Admission medications   Medication Sig Start Date End Date Taking? Authorizing Provider  acetaminophen (TYLENOL) 500 MG tablet Take 1,000 mg by mouth daily as needed for moderate pain.   Yes [provider]  aspirin EC 81 MG tablet Take 81 mg by mouth at bedtime.    Yes [provider]  B Complex-C-Folic Acid (NEPHRO-VITE PO) Take 1 tablet by mouth daily.   Yes [provider]  Calcium Acetate 667 MG TABS Take 667 mg by mouth See admin instructions. Take 667 mg by mouth three times a day with meals and 667 mg with each snack   Yes [provider]  Dakins (SODIUM HYPOCHLORITE) 0.25 % SOLN Apply 1 application topically See admin instructions. Apply to wounds as directed 2 times daily   Yes [provider]  diltiazem (CARDIZEM) 30  MG tablet Take 1 tablet every 4 hours AS NEEDED for rapid heart rate lasting more than 30 mins as long as top blood pressure >100. Patient taking differently: Take 30 mg by mouth every 4 (four) hours as needed. Take 30 mg by mouth every four hours as needed for rapid heart rate lasting more than 30 minutes, as long as Systolic number ("top blood pressure") is >100 03/09/17  Yes Sherran Needs, NP  diphenhydrAMINE (BENADRYL) 25 MG tablet Take 25 mg by mouth daily as needed for allergies.   Yes [provider]  DULoxetine (CYMBALTA) 60 MG capsule Take 1 capsule (60 mg total) by mouth daily. 12/03/12  Yes Short, Noah Delaine, MD  famotidine (PEPCID) 20 MG tablet TAKE 1 TABLET BY MOUTH AT BEDTIME Patient taking differently: Take 20 mg by mouth at bedtime.  10/15/14  Yes Tanda Rockers, MD  feeding supplement, RESOURCE BREEZE, (RESOURCE BREEZE) LIQD Take 1 Container by mouth daily as needed (for meal supplementation).    Yes [provider]  glipiZIDE (GLUCOTROL) 5 MG tablet Take 0.5 tablets (2.5 mg total) by mouth daily. 08/31/16  Yes Debbe Odea, MD  levothyroxine (SYNTHROID, LEVOTHROID) 75 MCG tablet Take 75 mcg by mouth daily before breakfast.  08/17/15  Yes [provider]  midodrine (PROAMATINE) 10 MG tablet Take 1 tablet (10 mg total) by mouth 3 (three) times daily. Patient taking differently: Take 10 mg by mouth See admin instructions. Take 10 mg by mouth in the morning on Mon/Wed/Fri ONLY- before dialysis 08/31/16  Yes Rizwan, Eunice Blase, MD  Multiple Vitamin (MULTIVITAMIN WITH MINERALS) TABS tablet Take 1 tablet by mouth daily.   Yes [provider]  oxyCODONE-acetaminophen (PERCOCET/ROXICET) 5-325 MG tablet Take 1-2 tablets by mouth every 6 (six) hours as needed. Patient has not taken since started dialysis 08/2013 due to causes hypotension Patient taking differently: Take 2 tablets by mouth every 6 (six) hours as needed for moderate pain.  07/25/16  Yes Virgina Jock  A, PA-C  pravastatin (PRAVACHOL) 40 MG tablet Take 40 mg by mouth every evening.  12/03/12  Yes Short, Noah Delaine, MD  Probiotic Product (PROBIOTIC DAILY PO) Take 1 capsule by mouth 2 (two) times daily.    Yes [provider]  sevelamer carbonate (RENVELA) 800 MG tablet Take 2,400 mg by mouth 3 (three) times daily with meals.   Yes [provider]  sucroferric oxyhydroxide (VELPHORO) 500 MG chewable tablet Chew 500-1,000 mg by mouth See admin instructions. Chew 1,000 mg by mouth with each meal and 500 mg with each snack   Yes [provider]  ciprofloxacin (CIPRO) 500 MG tablet TAKE 1 TABLET BY MOUTH EVERY DAY Patient not taking: Reported on 01/27/2019 10/11/18   Tommy Medal, Lavell Islam, MD  doxycycline (VIBRA-TABS) 100 MG tablet Take 1 tablet (100 mg total) by mouth 2 (two) times daily. Patient not taking: Reported on 01/27/2019 09/20/18   Rai, Vernelle Emerald, MD  oxyCODONE (OXY IR/ROXICODONE) 5 MG immediate release tablet Take 1 tablet (5 mg total) by mouth every 6 (six) hours as needed (for pain score of 1-4). Patient not taking: Reported on 01/27/2019 10/10/18   Ulyses Amor, [place]    Physical Exam: Vitals:   01/27/19 2120 01/27/19 2130 01/27/19 2150 01/27/19 2327  BP: 90/60 96/61 100/64   Pulse: (!) 111 (!) 108 (!) 106 (!) 112  Resp: (!) 32 (!) 27 (!) 41 20  Temp:      TempSrc:      SpO2: 100% 100% 100%     Constitutional: Respiratory distress initially, breathing 30-40 / min on BIPAP Eyes: PERRL, lids and conjunctivae normal ENMT: Mucous membranes are moist. Posterior pharynx clear of any exudate or lesions.Normal dentition.  Neck: normal, supple, no masses, no thyromegaly Respiratory: Tachypnic, limited by body habitus Cardiovascular: Limited by body habitus  Abdomen: no tenderness, no masses palpated. No hepatosplenomegaly. Bowel sounds positive.  Musculoskeletal: no clubbing / cyanosis. No joint deformity upper and lower extremities. Good ROM, no contractures.  Normal muscle tone.  Skin: + malodorous intertrigo and tissue ulceration of under the pannus  Neurologic: MAE, grossly non-focal Psychiatric: lethargic on BIPAP   Labs on Admission: I have personally reviewed following labs and imaging studies  CBC: Recent Labs  Lab 01/27/19 2012 01/27/19 2029  WBC 33.0*  --   NEUTROABS 31.1*  --   HGB 10.2* 11.9*  HCT 37.9 35.0*  MCV 104.4*  --   PLT 340  --    Basic Metabolic Panel: Recent Labs  Lab 01/27/19 2012 01/27/19 2029  NA 131* 132*  K 4.2 4.3  CL 95*  --   CO2 23  --   GLUCOSE 281*  --   BUN 40*  --   CREATININE 7.36*  --   CALCIUM 9.0  --    GFR: Estimated Creatinine Clearance: 14.3 mL/min (A) (by C-G formula based on SCr of 7.36 mg/dL (H)). Liver Function Tests: Recent Labs  Lab 01/27/19 2012  AST 21  ALT 18  ALKPHOS 123  BILITOT 0.4  PROT 9.3*  ALBUMIN 2.8*   No results for input(s): LIPASE, AMYLASE in the last 168 hours. No results for input(s): AMMONIA in the last 168 hours. Coagulation Profile: No results for input(s): INR, PROTIME in the last 168 hours. Cardiac Enzymes: No results for input(s): CKTOTAL, CKMB, CKMBINDEX, TROPONINI in the last 168 hours. BNP (last 3 results) No results for input(s): PROBNP in the last 8760 hours. HbA1C: No results for input(s): HGBA1C in the last 72 hours. CBG: No results for input(s): GLUCAP in the last 168 hours. Lipid Profile: No results for input(s): CHOL, HDL, LDLCALC, TRIG, CHOLHDL, LDLDIRECT in the last 72 hours. Thyroid Function Tests: No results for input(s): TSH, T4TOTAL, FREET4, T3FREE, THYROIDAB in the last 72 hours. Anemia Panel: No results for input(s): VITAMINB12, FOLATE, FERRITIN, TIBC, IRON, RETICCTPCT in the last 72 hours. Urine analysis:    Component Value Date/Time   COLORURINE YELLOW 07/18/2013 0831   APPEARANCEUR CLOUDY (A) 07/18/2013 0831   LABSPEC 1.012 07/18/2013 0831   PHURINE 5.0 07/18/2013 0831   GLUCOSEU NEGATIVE  07/18/2013 0831    HGBUR SMALL (A) 07/18/2013 0831   BILIRUBINUR NEGATIVE 07/18/2013 0831   KETONESUR NEGATIVE 07/18/2013 0831   PROTEINUR 30 (A) 07/18/2013 0831   UROBILINOGEN 0.2 07/18/2013 0831   NITRITE NEGATIVE 07/18/2013 0831   LEUKOCYTESUR MODERATE (A) 07/18/2013 0831    Radiological Exams on Admission: Dg Chest Port 1 View  Result Date: 01/27/2019 CLINICAL DATA:  Fever EXAM: PORTABLE CHEST 1 VIEW COMPARISON:  09/18/2018 FINDINGS: Right-sided central venous catheter tip over the proximal right atrium. Cardiomegaly with vascular congestion. Hazy asymmetric left greater than right opacity. No pneumothorax. Streaky atelectasis at the right base. IMPRESSION: Rotated patient. Enlarged cardiomediastinal silhouette with vascular congestion and hazy left greater than right asymmetric opacity which may reflect edema. Streaky atelectasis at the right base. Electronically Signed   By: Donavan Foil M.D.   On: 01/27/2019 21:16    EKG: Independently reviewed.  Assessment/Plan Principal Problem:   Severe sepsis with acute organ dysfunction [facility]) Active Problems:   Morbid obesity ([place])   DM type 2 (diabetes mellitus, type 2) (HCC)   OSA (obstructive sleep apnea)   DCM (dilated cardiomyopathy) (HCC)   Pressure ulcer   ESRD (end stage renal disease) on dialysis ([place])   Persistent atrial fibrillation   Acute on chronic respiratory failure with hypoxia (HCC)   Acute encephalopathy    1. Severe sepsis - 1. Cefepime / flagyl / vanc 2. No further fluids due to pulm edema on CXR 3. Repeat lactate pending 4. BCx pending 5. Influenza PCR pending 6. Repeat CBC in AM 2. Hidradenitis / pressure ulcer - 1. Wound care consult 2. ABx as above 3. When more stable, will attempt to get imaging studies (CT) 3. Acute on chronic resp failure - 1. Pulm edema vs underlying PNA 2. BNP also pending 3. Breathing seems much more stable post PCCM adjusting BIPAP settings 1. Mental status seems improved too 4. ABG on BIPAP  looks good even before setting changes. 4. ESRD - 1. Spoke with Dr. Joelyn Oms about patient including the pulm edema as above 2. For now he would recommend trying to stabilize patient medically overnight and they will see in AM 3. Doesn't sound like she would tolerate immediate dialysis (also because she didn't really tolerate dialysis earlier today), so not attempting emergent dialysis right now. 4. Repeat BMP in AM 5. Acute encephalopathy - 1. Due to above 2. Seems improved at the moment, much more alert than earlier, not sure if this is due to improvement in fever (they need to repeat a temp), or just due to her being able to breath better post adjustment of BIPAP 6. A.Fib - 1. Tele monitor 2. Looks like she takes PRN cardizem only 3. Will use cardiem gtt if needed 7. Hypothyroidism - 1. Convert synthroid to IV 8. H/o DCM - 1. With pulm edema today 2. BNP pending, also checking a trop 3. 2d echo pending 4. No lasix since she is anuric 5. Got 3h of dialysis today 6. Nephro to see in AM, possibly for more dialysis 9. DM2 - 1. Sensitive SSI Q4H  DVT prophylaxis: Heparin [place] Code Status: Full Family Communication: Husband at bedside Disposition Plan: TBD Consults called: PCCM, Nephro Admission status: Admit to inpatient.  Severity of Illness: The appropriate patient status for this patient is INPATIENT. Inpatient status is judged to be reasonable and necessary in order to provide the required intensity of service to ensure the patient's safety. The patient's presenting symptoms, physical exam findings, and initial radiographic  and laboratory data in the context of their chronic comorbidities is felt to place them at high risk for further clinical deterioration. Furthermore, it is not anticipated that the patient will be medically stable for discharge from the hospital within 2 midnights of admission. The following factors support the patient status of inpatient.   " The patient's  presenting symptoms include Fever, AMS, respiratory distress, purulent drainage from sacral decubitus ulcer. " The worrisome physical exam findings include Fever, AMS, resp distress, purulent drainage from sacral decubitus ulcer. " The initial radiographic and laboratory data are worrisome because of Pulm edema vs PNA on CXR, WBC 33k, lactate 2.8. " The chronic co-morbidities include ESRD, OSA, PAF, DM2, hidradenitis.   * I certify that at the point of admission it is my clinical judgment that the patient will require inpatient hospital care spanning beyond 2 midnights from the point of admission due to high intensity of service, high risk for further deterioration and high frequency of surveillance required.*    ,  M. DO [facility]  How to contact the [facility] Attending or Consulting provider [place] or covering provider during after hours [place], for this patient?  1. Check the care team in [facility] and look for a) attending/consulting [facility] provider listed and b) the [facility] team listed 2. Log into [email or web address]  Amion Physician Scheduling and messaging for groups and whole hospitals  On call and physician scheduling software for group practices, residents, hospitalists and other medical providers for call, clinic, rotation and shift schedules. OnCall [employer] is a hospital-wide system for scheduling doctors and paging doctors on call. EasyPlot is for scientific plotting and data analysis.  [email or web address]  and use [place]'s universal password to access. If you do not have the password, please contact the hospital operator.  3. Locate the [facility] provider you are looking for under [facility] and page to a number that you can be directly reached. 4. If you still have difficulty reaching the provider, please page the [facility] (Director on Call) for the Hospitalists listed on amion for assistance.  01/28/2019, 12:04 AM

## 2019-01-28 NOTE — Progress Notes (Signed)
Very limited echo perfomed; patient refused to continue the exam.

## 2019-01-28 NOTE — Progress Notes (Signed)
[facility] Progress Note  Subjective: (no charge, initial consult after MN). No new c/o's, feeling better this am than yesterday. Getting IV abx w/ flagyl/ vanc/ cefepime  Vitals:   01/28/19 0703 01/28/19 0745 01/28/19 0916 01/28/19 0957  BP: 94/66 (!) 90/58  (!) 100/59  Pulse: 94   96  Resp: (!) 26 19  20   Temp:   (S) (!) 102.4 F (39.1 C)   TempSrc:   Rectal   SpO2: 99%   100%    Inpatient medications: . heparin  5,000 Units Subcutaneous Q8H  . insulin aspart  0-9 Units Subcutaneous Q4H  . levothyroxine  37.5 mcg Intravenous Daily  . sodium chloride flush  3 mL Intravenous Q12H   . sodium chloride    . acetaminophen Stopped (01/28/19 0213)  . ceFEPime (MAXIPIME) IV    . metronidazole Stopped (01/28/19 4765)  . [START ON 01/29/2019] vancomycin     sodium chloride, acetaminophen, ondansetron **OR** ondansetron (ZOFRAN) IV, sodium chloride flush  Iron/TIBC/Ferritin/ %Sat    Component Value Date/Time   IRON 34 (L) 12/22/2014 0756   TIBC 144 (L) 12/22/2014 0756   FERRITIN 780 (H) 12/22/2014 0756   IRONPCTSAT 24 12/22/2014 0756   IRONPCTSAT 10 (L) 06/01/2008 1547    Exam: GEN: Morbidly obese female on BiPAP, awake, interactive ENT: NCAT EYES: EOMI CV: Regular, normal S1-S2 PULM: Coarse breath sounds bilaterally, no wheezing, no distress, pursed lips, nasal O2 ABD: Obese, significant central adiposity, soft SKIN: Right IJ HD tract without tenderness, erythema, edema; bandage clean dry and intact EXT: No lower extremity edema   home meds:  - aspirin 81/ pravastatin 40 hs  - diltiazem 30mg  q 4h prn rapid HR/ midodrine 10 tid  - glipizide 2.5 qd  - duloxetine 60 qd/ oxycodone IR prn  - sucroferric oxyhydroxide ac tid/ sevelamer carb ac tid/ calc acetate tid ac  - levothyroxine 75 ug qam/ famotidine 20 hs  - cipro qd/ doxy bid    MWF [facility]  4h 87min   154.5kg   500/800  F200  2/2.25 bath   Hep 5000  - calcitriol 3 ug tiw  - mircera 75 ug every 2  wks  Summary 58 year old female ESRD presenting with hypoxia, acute respiratory distress, asymmetric infiltrates chest x-ray, high fever and marked leukocytosis; most consistent with HCAP.     Assessment/ Plan: 1. ESRD: MWF TDC [facility]. HD tomorrow.  2. Hypoxic RF/Resp Distress/HCAP: on nasal O2, seen by PCCM, per TRH 3. Chronic TDC: [facility] following as outpt for new access 4. Anemia ckd:  Hb stable 5. CKD-BMD 6. Chronic hypotension: uses midodrine with HD  7. H/o hidradenitis 8. H/o atrial flutter/ afib paroxsymal: dilt prn 9. NICM EF 40%      [facility] 01/28/2019, 10:25 AM  Recent Labs  Lab 01/27/19 2012 01/27/19 2029 01/28/19 0318  NA 131* 132* 131*  K 4.2 4.3 4.7  CL 95*  --  98  CO2 23  --  23  GLUCOSE 281*  --  199*  BUN 40*  --  44*  CREATININE 7.36*  --  7.63*  CALCIUM 9.0  --  8.7*  ALBUMIN 2.8*  --   --    Recent Labs  Lab 01/27/19 2012  AST 21  ALT 18  ALKPHOS 123  BILITOT 0.4  PROT 9.3*   Recent Labs  Lab 01/27/19 2012 01/27/19 2029 01/28/19 0318  WBC 33.0*  --  37.9*  NEUTROABS 31.1*  --   --  HGB 10.2* 11.9* 9.1*  HCT 37.9 35.0* 33.4*  MCV 104.4*  --  103.7*  PLT 340  --  297

## 2019-01-28 NOTE — ED Notes (Signed)
Got patient clean up and patient is resting with call bell in family at bedside

## 2019-01-28 NOTE — ED Notes (Signed)
Pt requesting to sit on side of the bed. This RN and EMT explained to patient that she may not sit on side of bed d/t sacral wounds and fall risk. Pt verbalizes understanding.

## 2019-01-28 NOTE — Consult Note (Signed)
Burman Foster Admit Date: 01/27/2019 01/28/2019 Rexene Agent Requesting Physician:  Alcario Drought MD  Reason for Consult:  SOB, Hypoxia, Hypotension, ESRD HPI:  58 year old female ESRD on MWF at [facility] who presented this evening with dyspnea, hypoxia, fever.  Other notable history includes morbid obesity, chronic hypotension on midodrine, OSA not on CPAP, chronic hidradenitis separative of, hard of hearing, type 2 diabetes, patient seen in the emergency room with husband at chair side.  She attended her dialysis treatment today but it was terminated by their report about 1 hour early due to acutely being unwell.  She had some ultrafiltration.  In the emergency room the patient had a temperature of 103.8F, WBC of 33 with neutrophilic predominance, portable chest x-ray with left greater than right asymmetric opacities of the lungs.  Patient placed on BiPAP for respiratory distress.  Blood pressures have been variable between 80 and 935 systolic.  She is awake, alert, interactive.  Patient received cefepime, Flagyl, vancomycin in the emergency room.  Seen by critical care.    Labs notable for potassium of 4.2, bicarbonate 23, hemoglobin 10.2.  ROS Balance of 12 systems is negative w/ exceptions as above  PMH  Past Medical History:  Diagnosis Date  . Chronic diastolic CHF (congestive heart failure) ([employer])   . DCM (dilated cardiomyopathy) ([place])    a. presumed nonischemic - no prior cath/nuc. EF 35-40% in 2011, most recently  60-65% by echo 2017.  Marland Kitchen Depression   . DM type 2 (diabetes mellitus, type 2) ([facility])    diet controlledwith retinopathy and nephropathy  . ESRD on hemodialysis ([place]) 08/21/2017  . GERD (gastroesophageal reflux disease)   . H/O cardiac arrest 10/2012  . Hard of hearing 10/2012   now wears 2 hearing aids  . History of hemodialysis dec 2013  . HTN (hypertension)   . Hydradenitis   . Hyperkalemia   . Hyperlipidemia   . Hypothyroidism   . Iron deficiency anemia   . Morbid obesity  ([place])   . Multinodular goiter   . Neuropathic pain 08/21/2017  . Orthostatic hypotension 06/27/2018  . PAF (paroxysmal atrial fibrillation) ([place])   . Paroxysmal atrial flutter ([place])   . Pneumonia Nov 10, 2012  . Sleep apnea    Intolerant to CPAP  . Tinnitus 12/26/2016  . Typical atrial flutter ([place]) 04/10/2017  . Urinary tract infection    taking antibiotics for 3 days prior to surgery   [facility]  Past Surgical History:  Procedure Laterality Date  . AV FISTULA PLACEMENT Left 07/25/2013   Procedure: ARTERIOVENOUS (AV) FISTULA CREATION ;  Surgeon: Rosetta Posner, MD;  Location: [place];  Service: Vascular;  Laterality: Left;  . AV FISTULA PLACEMENT Left 10/10/2018   Procedure: INSERTION OF LEFT UPPER ARM brachial to axillary artery ARTERIOVENOUS (AV) GORE-TEX GRAFT using 4-7 stretch graft;  Surgeon: Waynetta Sandy, MD;  Location: [place];  Service: Vascular;  Laterality: Left;  . CYSTOSCOPY W/ RETROGRADES  11/16/2012   Procedure: CYSTOSCOPY WITH RETROGRADE PYELOGRAM;  Surgeon: Reece Packer, MD;  Location: [facility];  Service: Urology;  Laterality: Bilateral;  CYSTOSCOPY,BILATERAL RETROGRADE PYELOGRAM/ REMOVAL LEFT URETERAL STENT/ FULGERATION BLADDER MUCOSA/ INSERTION RIGHT URETERAL STENT  . CYSTOSCOPY W/ URETERAL STENT PLACEMENT  05/28/2012   Procedure: CYSTOSCOPY WITH RETROGRADE PYELOGRAM/URETERAL STENT PLACEMENT;  Surgeon: Reece Packer, MD;  [facility];  Service: Urology;  Laterality: Left;  . CYSTOSCOPY WITH RETROGRADE PYELOGRAM, URETEROSCOPY AND STENT PLACEMENT Right 06/23/2013   Procedure: CYSTOSCOPY WITH RIGHT URETEROSCOPY, RIGHT  RETROGRADE PYELOGRAM, WITH LASER LIPOTRIPSY AND RIGHT URETERAL STENT EXCHANGE ;  Surgeon: Molli Hazard, MD;  [facility];  Service: Urology;  Laterality: Right;  STENT EXCHANGE    . CYSTOSCOPY WITH RETROGRADE PYELOGRAM, URETEROSCOPY AND STENT PLACEMENT Right 11/06/2013   Procedure: CYSTOSCOPY WITH RETROGRADE PYELOGRAM, URETEROSCOPY  STONE REMOVAL AND STENT REMOVAL;  Surgeon: Molli Hazard, MD;  [facility];  Service: Urology;  Laterality: Right;  . HOLMIUM LASER APPLICATION N/A 5/40/9811   Procedure: HOLMIUM LASER APPLICATION;  Surgeon: Molli Hazard, MD;  [facility];  Service: Urology;  Laterality: N/A;  . INSERTION OF DIALYSIS CATHETER N/A 09/24/2013   Procedure: INSERTION OF DIALYSIS CATHETER;  Surgeon: Rosetta Posner, MD;  [facility]: [facility];  Service: Vascular;  Laterality: N/A;  . IR AV DIALY SHUNT INTRO NEEDLE/INTRAC INITIAL W/PTA/STENT/IMG LT Left 08/13/2018  . IR AV DIALY SHUNT INTRO NEEDLE/INTRACATH INITIAL W/PTA/IMG LEFT  04/19/2018  . IR AV DIALY SHUNT INTRO NEEDLE/INTRACATH INITIAL W/PTA/IMG LEFT  08/14/2018  . IR FLUORO GUIDE CV LINE RIGHT  09/17/2018  . IR FLUORO GUIDE CV LINE RIGHT  09/20/2018  . IR US GUIDE VASC ACCESS LEFT  04/19/2018  . IR US GUIDE VASC ACCESS LEFT  08/13/2018  . IR US GUIDE VASC ACCESS LEFT  08/14/2018  . IR US GUIDE VASC ACCESS RIGHT  09/17/2018  . PORT-A-CATH REMOVAL    . PORTACATH PLACEMENT    . REVISON OF ARTERIOVENOUS FISTULA Left 07/25/2016   Procedure: REVISON OF LEFT ARTERIOVENOUS FISTULA;  Surgeon: Elam Dutch, MD;  Location: [place];  Service: Vascular;  Laterality: Left;  . THROMBECTOMY AND REVISION OF ARTERIOVENTOUS (AV) GORETEX  GRAFT Left 10/10/2018   Procedure: attempted of THROMBECTOMY OF ARTERIOVENTOUS FISTULA LEFT ARM;  Surgeon: Waynetta Sandy, MD;  Location: [place];  Service: Vascular;  Laterality: Left;   FH  Family History  Problem Relation Age of Onset  . Coronary artery disease Mother   . Hypertension Mother   . Diabetes type II Mother   . Coronary artery disease Father   . Hypertension Father   . Malignant hyperthermia Father   . Cancer Maternal Grandfather        ? Type  . Kidney failure Maternal Grandmother    SH  reports that she quit smoking about 39 years ago. Her smoking use included cigarettes. She has a 0.25  pack-year smoking history. She has never used smokeless tobacco. She reports that she does not drink alcohol or use drugs. Allergies  Allergies  Allergen Reactions  . Rosiglitazone Other (See Comments)    "Avandia" = Reaction not recalled  . Amoxicillin Rash and Other (See Comments)    Tolerated Zosyn 01/2013. [place] Has patient had a PCN reaction causing immediate rash, facial/tongue/throat swelling, SOB or lightheadedness with hypotension: Yes Has patient had a PCN reaction causing severe rash involving mucus membranes or skin necrosis: No Has patient had a PCN reaction that required hospitalization: No Has patient had a PCN reaction occurring within the last 10 years: No If all of the above answers are "NO", then may proceed with Cephalosporin use.   Marland Kitchen Amoxicillin-Pot Clavulanate Diarrhea   Home medications Prior to Admission medications   Medication Sig Start Date End Date Taking? Authorizing Provider  acetaminophen (TYLENOL) 500 MG tablet Take 1,000 mg by mouth daily as needed for moderate pain.   Yes [provider]  aspirin EC 81 MG tablet Take 81 mg by mouth at bedtime.  Yes [provider]  B Complex-C-Folic Acid (NEPHRO-VITE PO) Take 1 tablet by mouth daily.   Yes [provider]  Calcium Acetate 667 MG TABS Take 667 mg by mouth See admin instructions. Take 667 mg by mouth three times a day with meals and 667 mg with each snack   Yes [provider]  Dakins (SODIUM HYPOCHLORITE) 0.25 % SOLN Apply 1 application topically See admin instructions. Apply to wounds as directed 2 times daily   Yes [provider]  diltiazem (CARDIZEM) 30 MG tablet Take 1 tablet every 4 hours AS NEEDED for rapid heart rate lasting more than 30 mins as long as top blood pressure >100. Patient taking differently: Take 30 mg by mouth every 4 (four) hours as needed. Take 30 mg by mouth every four hours as needed for rapid heart rate lasting more than 30  minutes, as long as Systolic number ("top blood pressure") is >100 03/09/17  Yes Sherran Needs, NP  diphenhydrAMINE (BENADRYL) 25 MG tablet Take 25 mg by mouth daily as needed for allergies.   Yes [provider]  DULoxetine (CYMBALTA) 60 MG capsule Take 1 capsule (60 mg total) by mouth daily. 12/03/12  Yes Short, Noah Delaine, MD  famotidine (PEPCID) 20 MG tablet TAKE 1 TABLET BY MOUTH AT BEDTIME Patient taking differently: Take 20 mg by mouth at bedtime.  10/15/14  Yes Tanda Rockers, MD  feeding supplement, RESOURCE BREEZE, (RESOURCE BREEZE) LIQD Take 1 Container by mouth daily as needed (for meal supplementation).    Yes [provider]  glipiZIDE (GLUCOTROL) 5 MG tablet Take 0.5 tablets (2.5 mg total) by mouth daily. 08/31/16  Yes Debbe Odea, MD  levothyroxine (SYNTHROID, LEVOTHROID) 75 MCG tablet Take 75 mcg by mouth daily before breakfast.  08/17/15  Yes [provider]  midodrine (PROAMATINE) 10 MG tablet Take 1 tablet (10 mg total) by mouth 3 (three) times daily. Patient taking differently: Take 10 mg by mouth See admin instructions. Take 10 mg by mouth in the morning on Mon/Wed/Fri ONLY- before dialysis 08/31/16  Yes Rizwan, Eunice Blase, MD  Multiple Vitamin (MULTIVITAMIN WITH MINERALS) TABS tablet Take 1 tablet by mouth daily.   Yes [provider]  oxyCODONE-acetaminophen (PERCOCET/ROXICET) 5-325 MG tablet Take 1-2 tablets by mouth every 6 (six) hours as needed. Patient has not taken since started dialysis 08/2013 due to causes hypotension Patient taking differently: Take 2 tablets by mouth every 6 (six) hours as needed for moderate pain.  07/25/16  Yes Virgina Jock A, PA-C  pravastatin (PRAVACHOL) 40 MG tablet Take 40 mg by mouth every evening.  12/03/12  Yes Short, Noah Delaine, MD  Probiotic Product (PROBIOTIC DAILY PO) Take 1 capsule by mouth 2 (two) times daily.    Yes [provider]  sevelamer carbonate (RENVELA) 800 MG tablet Take 2,400 mg by mouth  3 (three) times daily with meals.   Yes [provider]  sucroferric oxyhydroxide (VELPHORO) 500 MG chewable tablet Chew 500-1,000 mg by mouth See admin instructions. Chew 1,000 mg by mouth with each meal and 500 mg with each snack   Yes [provider]  ciprofloxacin (CIPRO) 500 MG tablet TAKE 1 TABLET BY MOUTH EVERY DAY Patient not taking: Reported on 01/27/2019 10/11/18   Tommy Medal, Lavell Islam, MD  doxycycline (VIBRA-TABS) 100 MG tablet Take 1 tablet (100 mg total) by mouth 2 (two) times daily. Patient not taking: Reported on 01/27/2019 09/20/18   Rai, Vernelle Emerald, MD  oxyCODONE (OXY IR/ROXICODONE) 5 MG  immediate release tablet Take 1 tablet (5 mg total) by mouth every 6 (six) hours as needed (for pain score of 1-4). Patient not taking: Reported on 01/27/2019 10/10/18   Ulyses Amor, PA-C    Current Medications Scheduled Meds: . heparin  5,000 Units Subcutaneous Q8H  . insulin aspart  0-9 Units Subcutaneous Q4H  . levothyroxine  37.5 mcg Intravenous Daily  . sodium chloride flush  3 mL Intravenous Q12H   Continuous Infusions: . sodium chloride    . acetaminophen    . ceFEPime (MAXIPIME) IV    . metronidazole Stopped (01/27/19 2121)  . [START ON 01/29/2019] vancomycin     PRN Meds:.sodium chloride, acetaminophen, ondansetron **OR** ondansetron (ZOFRAN) IV, sodium chloride flush  CBC Recent Labs  Lab 01/27/19 2012 01/27/19 2029  WBC 33.0*  --   NEUTROABS 31.1*  --   HGB 10.2* 11.9*  HCT 37.9 35.0*  MCV 104.4*  --   PLT 340  --    Basic Metabolic Panel Recent Labs  Lab 01/27/19 2012 01/27/19 2029  NA 131* 132*  K 4.2 4.3  CL 95*  --   CO2 23  --   GLUCOSE 281*  --   BUN 40*  --   CREATININE 7.36*  --   CALCIUM 9.0  --     Physical Exam  Blood pressure (!) 117/56, pulse (!) 101, temperature (!) 103.5 F (39.7 C), temperature source Rectal, resp. rate (!) 24, last menstrual period 06/13/2013, SpO2 100 %. GEN: Morbidly obese female on BiPAP, awake,  interactive ENT: NCAT EYES: EOMI CV: Regular, normal S1-S2 PULM: Coarse breath sounds bilaterally ABD: Obese, significant central adiposity, soft SKIN: Right IJ HD tract without tenderness, erythema, edema; bandage clean dry and intact EXT: No lower extremity edema   Assessment 58 year old female ESRD presenting with hypoxia, acute respiratory distress, asymmetric infiltrates on chest x-ray, leukocytosis; most consistent with HCAP.  Given that she had some dialysis today and her presenting features most consistent with sepsis I believe that she has pneumonia as the primary pulmonary issue.  1. ESRD MWF TDC [facility] 2. Hypoxic RF/Resp Distress/HCAP on BiPAP; seen by PCCM, per TR[facility] 3. Chronic TDC, [facility] following as outpt for new access 4. ANemia, Hb stable 5. CKD-BMD 6. Chronic hypotension, uses midodrine with HD  Plan 1. No immediate indication for hemodialysis 2. Will follow overnight, reassess in the morning for next treatment or continuing on outpatient schedule  Pearson Grippe MD 01/28/2019, 1:00 AM

## 2019-01-28 NOTE — ED Notes (Signed)
Patient transported to CT 

## 2019-01-28 NOTE — ED Notes (Signed)
Ordered diet tray for pt  

## 2019-01-28 NOTE — Evaluation (Signed)
Physical Therapy Evaluation Patient Details Name: [name] MRN: [id number] DOB: October 10, 1961 Today's Date: 01/28/2019   History of Present Illness  Pt is a 58 y/o female admitted secondary to increased lethargy and respiratory failure. Required Bipap upon admission. Found to have Severe sepsis due to hydradenitis v/s HCAP. PMH includes DM2, ESRD on MWF dialysis, OSA intolerant to CPAP, morbid obesity, PAF.   Clinical Impression  Pt admitted secondary to problem above with deficits below. Pt requiring max A +2 for bed mobility, and min to mod A +2 for transfer to chair. Pt with increased SOB, however, sats WFL on 2L of oxygen throughout. If pt progresses with mobility, will likely be able to d/c home with with HHPT. However, if pt does not progress, may need to consider short term SNF. Will update recommendations based on pt progress. Will continue to follow acutely to maximize functional mobility independence and safety.     Follow Up Recommendations Supervision for mobility/OOB;Other (comment)(SNF vs HHPT pending mobility progression )    Equipment Recommendations  Rolling walker with 5" wheels(bariatric RW )    Recommendations for Other Services       Precautions / Restrictions Precautions Precautions: Fall Restrictions Weight Bearing Restrictions: No      Mobility  Bed Mobility Overal bed mobility: Needs Assistance Bed Mobility: Sidelying to Sit   Sidelying to sit: Mod assist;Max assist;+2 for physical assistance       General bed mobility comments: Pt in sidelying upon entry. REquired mod-max A +2 for trunk elevation.   Transfers Overall transfer level: Needs assistance Equipment used: 2 person hand held assist Transfers: Sit to/from [facility] Sit to Stand: Min assist;Mod assist;+2 physical assistance Stand pivot transfers: Min assist;+2 physical assistance       General transfer comment: Heavy min to mod A +2 for lift assist to stand from air  mattress. Min A +2 for steadying to transfer to chair. Pt with increased SOB, however, oxygen sats WFL on 2L.   Ambulation/Gait                Stairs            Wheelchair Mobility    Modified Rankin (Stroke Patients Only)       Balance Overall balance assessment: Needs assistance Sitting-balance support: Bilateral upper extremity supported;Feet supported Sitting balance-Leahy Scale: Poor Sitting balance - Comments: Reliant on BUE support to maintain sitting balance.    Standing balance support: Bilateral upper extremity supported;During functional activity Standing balance-Leahy Scale: Poor Standing balance comment: Reliant on BUE and external support to maintain sitting balance.                              Pertinent Vitals/Pain Pain Assessment: Faces Faces Pain Scale: Hurts even more Pain Location: stomach secondary to wounds.  Pain Descriptors / Indicators: Grimacing;Guarding Pain Intervention(s): Limited activity within patient's tolerance;Monitored during session;Repositioned    Home Living Family/patient expects to be discharged to:: Private residence Living Arrangements: Spouse/significant other Available Help at Discharge: Family("most of the day" ) Type of Home: House Home Access: Level entry     Home Layout: Two level Home Equipment: Cane - single point Additional Comments: Reports she has a walker, but unsure what type.     Prior Function Level of Independence: Needs assistance   Gait / Transfers Assistance Needed: Was independent with mobility tasks.   ADL's / Homemaking Assistance Needed: Required assist from husband and  mother for bathing. Performed sponge baths. Reports she did not get in the shower.         Hand Dominance        Extremity/Trunk Assessment   Upper Extremity Assessment Upper Extremity Assessment: Defer to OT evaluation    Lower Extremity Assessment Lower Extremity Assessment: Generalized weakness     Cervical / Trunk Assessment Cervical / Trunk Assessment: Normal  Communication   Communication: HOH  Cognition Arousal/Alertness: Awake/alert Behavior During Therapy: WFL for tasks assessed/performed Overall Cognitive Status: Impaired/Different from baseline Area of Impairment: Problem solving                             Problem Solving: Slow processing        General Comments General comments (skin integrity, edema, etc.): [name] present in room upon entry.     Exercises     Assessment/Plan    PT Assessment Patient needs continued PT services  PT Problem List Decreased strength;Decreased balance;Decreased activity tolerance;Decreased mobility;Decreased knowledge of use of DME;Decreased knowledge of precautions;Pain;Decreased skin integrity       PT Treatment Interventions DME instruction;Gait training;Stair training;Therapeutic activities;Functional mobility training;Therapeutic exercise;Balance training;Patient/family education    PT Goals (Current goals can be found in the Care Plan section)  Acute Rehab PT Goals Patient Stated Goal: to go home PT Goal Formulation: With patient/family Time For Goal Achievement: 02/11/19 Potential to Achieve Goals: Good    Frequency Min 3X/week   Barriers to discharge        Co-evaluation               AM-PAC PT "6 Clicks" Mobility  Outcome Measure Help needed turning from your back to your side while in a flat bed without using bedrails?: A Lot Help needed moving from lying on your back to sitting on the side of a flat bed without using bedrails?: A Lot Help needed moving to and from a bed to a chair (including a wheelchair)?: A Lot Help needed standing up from a chair using your arms (e.g., wheelchair or bedside chair)?: A Lot Help needed to walk in hospital room?: A Lot Help needed climbing 3-5 steps with a railing? : A Lot 6 Click Score: 12    End of Session   Activity Tolerance: Patient limited  by fatigue Patient left: in chair;with call bell/phone within reach;with chair alarm set;with nursing/sitter in room;with family/visitor present Nurse Communication: Mobility status PT Visit Diagnosis: Other abnormalities of gait and mobility (R26.89);Unsteadiness on feet (R26.81);Muscle weakness (generalized) (M62.81)    Time: 2919-1660 PT Time Calculation (min) (ACUTE ONLY): 19 min   Charges:   PT Evaluation $PT Eval Moderate Complexity: [place], PT, DPT  Acute Rehabilitation Services  Pager: [phone] Office: [phone]   Rudean Hitt 01/28/2019, 6:00 PM

## 2019-01-28 NOTE — ED Notes (Signed)
Hospitalist Dr. Thereasa Solo bedside.

## 2019-01-28 NOTE — ED Notes (Signed)
Requested IV Tylenol from pharmacy.

## 2019-01-28 NOTE — ED Notes (Signed)
Patient took BiPAP mask off and refuses to put it on at this time. MD Alcario Drought made aware.

## 2019-01-28 NOTE — ED Notes (Signed)
Helped get patient straighten up in the bed patient is resting with family at bedside and call bell in reach

## 2019-01-28 NOTE — Progress Notes (Addendum)
[place] TEAM 1 - Stepdown/ICU TEAM  [name]  DGU:[id number] DOB: Jan 26, 1961 DOA: 01/27/2019 PCP: Harlan Stains, MD    Brief Narrative:  58 y.o. w/ a hx of DM2, ESRD on MWF dialysis, OSA intolerant to CPAP, morbid obesity, PAF, cardiac arrest, and hidradenitis who presented to the ED with respiratory distress and lethargy. She did not complete her dialysis on the day of her admit because she "did not feel well." Family ultimately called EMS because of worsening somnolence and difficulty breathing.  In the ED her Tm was 103.5. She was in respiratory distress and required rescue BIPAP. She was noted to have significant skin break down below her panus, with purulent drainage.  Significant Events: 3/3 admit   Subjective: Pt is seen for a f/u visit.    Assessment & Plan:  Severe sepsis due to hydradenitis v/s HCAP Clinically much improved/stabilizing - downgrade bed to tele bed - cont broad empiric abx coverage   Acute on chronic hypoxic resp failure  Much improved - alert and oriented   ESRD on HD M/W/F Has chronic TDC  Toxic metabolic encephalopathy  Appears to have resolved  OHS/SA  Afib   Hypothyroidism  DM  Obesity   DVT prophylaxis: SQ heparin  Code Status: FULL CODE Family Communication:  Disposition Plan:   Consultants:  PCCM Nephrology  ID  Antimicrobials:  Vanc 3/2  Cefepime 3/2 >  Flagyl 3/2 >    Objective: Blood pressure (!) 100/59, pulse 96, temperature (S) (!) 102.4 F (39.1 C), temperature source Rectal, resp. rate 20, last menstrual period 06/13/2013, SpO2 100 %.  Intake/Output Summary (Last 24 hours) at 01/28/2019 1159 Last data filed at 01/28/2019 9563 Gross per 24 hour  Intake 200.44 ml  Output -  Net 200.44 ml   There were no vitals filed for this visit.  Examination: Pt was seen for a f/u visit.    CBC: Recent Labs  Lab 01/27/19 2012 01/27/19 2029 01/28/19 0318  WBC 33.0*  --  37.9*  NEUTROABS 31.1*  --   --   HGB  10.2* 11.9* 9.1*  HCT 37.9 35.0* 33.4*  MCV 104.4*  --  103.7*  PLT 340  --  875   Basic Metabolic Panel: Recent Labs  Lab 01/27/19 2012 01/27/19 2029 01/28/19 0318  NA 131* 132* 131*  K 4.2 4.3 4.7  CL 95*  --  98  CO2 23  --  23  GLUCOSE 281*  --  199*  BUN 40*  --  44*  CREATININE 7.36*  --  7.63*  CALCIUM 9.0  --  8.7*   GFR: Estimated Creatinine Clearance: 13.8 mL/min (A) (by C-G formula based on SCr of 7.63 mg/dL (H)).  Liver Function Tests: Recent Labs  Lab 01/27/19 2012  AST 21  ALT 18  ALKPHOS 123  BILITOT 0.4  PROT 9.3*  ALBUMIN 2.8*    Cardiac Enzymes: Recent Labs  Lab 01/28/19 0214  TROPONINI 0.05*    HbA1C: Hgb A1c MFr Bld  Date/Time Value Ref Range Status  12/22/2014 05:54 AM 5.3 <5.7 % Final    Comment:    (NOTE)  According to the ADA Clinical Practice Recommendations for 2011, when HbA1c is used as a screening test:  >=6.5%   Diagnostic of Diabetes Mellitus           (if abnormal result is confirmed) 5.7-6.4%   Increased risk of developing Diabetes Mellitus References:Diagnosis and Classification of Diabetes Mellitus,Diabetes Care,2011,34(Suppl 1):S62-S69 and Standards of Medical Care in         Diabetes - 2011,Diabetes KGMW,1027,25 (Suppl 1):S11-S61.   07/17/2013 07:50 AM 5.7 (H) <5.7 % Final    Comment:    (NOTE)                                                                       According to the ADA Clinical Practice Recommendations for 2011, when HbA1c is used as a screening test:  >=6.5%   Diagnostic of Diabetes Mellitus           (if abnormal result is confirmed) 5.7-6.4%   Increased risk of developing Diabetes Mellitus References:Diagnosis and Classification of Diabetes Mellitus,Diabetes DGUY,4034,74(QVZDG 1):S62-S69 and Standards of Medical Care in         Diabetes - 2011,Diabetes Care,2011,34 (Suppl 1):S11-S61.    CBG: Recent Labs  Lab  01/28/19 0045 01/28/19 0450 01/28/19 0740  GLUCAP 226* 149* 110*     Scheduled Meds: . Chlorhexidine Gluconate Cloth  6 each Topical Q0600  . heparin  5,000 Units Subcutaneous Q8H  . insulin aspart  0-9 Units Subcutaneous Q4H  . levothyroxine  37.5 mcg Intravenous Daily  . sodium chloride flush  3 mL Intravenous Q12H   Continuous Infusions: . sodium chloride    . acetaminophen Stopped (01/28/19 0213)  . ceFEPime (MAXIPIME) IV    . metronidazole Stopped (01/28/19 3875)  . [START ON 01/29/2019] vancomycin       LOS: 0 days   Time spent: No Charge  Cherene Altes, MD [facility] Office  [phone] Pager - Text Page per Amion as per below:  On-Call/Text Page:      [email or web address]  If 7PM-7AM, please contact night-coverage [email or web address] 01/28/2019, 11:59 AM

## 2019-01-28 NOTE — Telephone Encounter (Signed)
[name] will clearly she needed to go in and this is not something I could have rescued with from

## 2019-01-28 NOTE — Telephone Encounter (Signed)
She can start on cefepime with dialysis center and oral metronidazole.  I believe dose of cefepime would be 2grams with HD session  Metronidazole would be 500mg  po TID  She can do this for 2 weeks  I am ccing Cassie to make sure the HD dose is appropriate

## 2019-01-28 NOTE — Telephone Encounter (Signed)
Patient went to ER (and is still admitted) with respiratory distress, increased somnolence during dialysis yesterday. It seems they started those antibiotic orders.

## 2019-01-28 NOTE — ED Notes (Signed)
Patient refused CT at this time. States "I can't lay on my back because it hurts my back and bottom too much". MD made aware.

## 2019-01-28 NOTE — Consult Note (Signed)
[facility] Nurse wound consult note Reason for Consult:Chronic hidradenitis suppurative lesions to bilateral inguinal folds and buttocks Wound type: infectious/inflammatory Pressure Injury POA: NA Measurement: Right inguinal 2 cm x 1 cm x 0.2 cm  LEft inguinal with scattered nonintact lesions with yellow creamy drainage and musty odor Gluteal folds are difficult to assess while in ED stretcher, she is sitting up due to back pain.  Body habitus and position make it a challenging visualization of entire area.  Wound WUJ:WJXBJY ruddy red Drainage (amount, consistency, odor) yellow, thick and musty odor Periwound:Skin is moist from effluent Dressing procedure/placement/frequency:Cleanse abdominal pannus and inguinal folds with soap and water.  Apply Interdry Ag: Measure and cut length of InterDry Ag+ to fit in skin folds that have skin breakdown Tuck InterDry  Ag+ fabric into skin folds in a single layer, allow for 2 inches of overhang from skin edges to allow for wicking to occur May remove to bathe; dry area thoroughly and then tuck into affected areas again Do not apply any creams or ointments when using InterDry Ag+ DO NOT THROW AWAY FOR 5 DAYS unless soiled with stool DO NOT [facility] product, this will inactivate the silver in the material  New sheet of Interdry Ag+ should be applied after 5 days of use if patient continues to have skin breakdown  Cleanse buttocks wounds with soap and water.  Apply Aquacel Ag to nonintact wounds.  Cover with ABD pad and tape.  Change daily and PRN soilage.  Will not follow at this time.  Please re-consult if needed.  Domenic Moras MSN, RN, FNP-BC CWON Wound, Ostomy, Continence Nurse Pager [phone]

## 2019-01-28 NOTE — ED Notes (Signed)
MD Alcario Drought made aware of critical lactic acid. Will continue to monitor at this time.

## 2019-01-28 NOTE — ED Notes (Signed)
Wound care RN bedside

## 2019-01-28 NOTE — ED Notes (Signed)
Patient difficult stick delay in repeat lactic at this time.

## 2019-01-28 NOTE — ED Notes (Signed)
Paged MD Alcario Drought about BP.

## 2019-01-28 NOTE — ED Notes (Signed)
Patient given ice chips per MD Alcario Drought.

## 2019-01-28 NOTE — ED Notes (Signed)
ED TO INPATIENT HANDOFF REPORT  ED Nurse Name and Phone #:  Percell Locus, RN, Sharyn Lull, RN  S Name/Age/Gender [name] 58 y.o. female Room/Bed: 026C/026C  Code Status   Code Status: Full Code  Home/SNF/Other Home Patient oriented to: self, place, time and situation Is this baseline? Yes   Triage Complete: Triage complete  Chief Complaint SOB  Triage Note Patient BIB [facility] for SOB and lethargy today. Per EMS patient when to dialysis this morning but could not finish because "I didn't feel good". Family oral temp 102.7 and was given Tylenol. Patient is on 2L at home. Patient A&O x 4 but is slow to respond. Patient reports being treated for Sepsis last month.   BP 104/62 HR 130 RR 35-40    Allergies Allergies  Allergen Reactions  . Rosiglitazone Other (See Comments)    "Avandia" = Reaction not recalled  . Amoxicillin Rash and Other (See Comments)    Tolerated Zosyn 01/2013. [place] Has patient had a PCN reaction causing immediate rash, facial/tongue/throat swelling, SOB or lightheadedness with hypotension: Yes Has patient had a PCN reaction causing severe rash involving mucus membranes or skin necrosis: No Has patient had a PCN reaction that required hospitalization: No Has patient had a PCN reaction occurring within the last 10 years: No If all of the above answers are "NO", then may proceed with Cephalosporin use.   Marland Kitchen Amoxicillin-Pot Clavulanate Diarrhea    Level of Care/Admitting Diagnosis ED Disposition    ED Disposition Condition [facility] Area: [place] [100100]  Level of Care: Medical Telemetry [104]  Diagnosis: Sepsis [facility]) [[id number]]  Admitting Physician: Thereasa Solo, JEFFREY T [[id number]]  Attending Physician: Thereasa Solo, JEFFREY T [[id number]]  Estimated length of stay: past midnight tomorrow  Certification:: I certify this patient will need inpatient services for at least 2 midnights  PT Class (Do Not Modify): Inpatient [101]  PT  Acc Code (Do Not Modify): Private [1]       B Medical/Surgery History Past Medical History:  Diagnosis Date  . Chronic diastolic CHF (congestive heart failure) ([place])   . DCM (dilated cardiomyopathy) ([place])    a. presumed nonischemic - no prior cath/nuc. EF 35-40% in 2011, most recently  60-65% by echo 2017.  Marland Kitchen Depression   . DM type 2 (diabetes mellitus, type 2) ([facility])    diet controlledwith retinopathy and nephropathy  . ESRD on hemodialysis ([place]) 08/21/2017  . GERD (gastroesophageal reflux disease)   . H/O cardiac arrest 10/2012  . Hard of hearing 10/2012   now wears 2 hearing aids  . History of hemodialysis dec 2013  . HTN (hypertension)   . Hydradenitis   . Hyperkalemia   . Hyperlipidemia   . Hypothyroidism   . Iron deficiency anemia   . Morbid obesity ([place])   . Multinodular goiter   . Neuropathic pain 08/21/2017  . Orthostatic hypotension 06/27/2018  . PAF (paroxysmal atrial fibrillation) ([place])   . Paroxysmal atrial flutter ([place])   . Pneumonia Nov 10, 2012  . Sleep apnea    Intolerant to CPAP  . Tinnitus 12/26/2016  . Typical atrial flutter ([place]) 04/10/2017  . Urinary tract infection    taking antibiotics for 3 days prior to surgery   Past Surgical History:  Procedure Laterality Date  . AV FISTULA PLACEMENT Left 07/25/2013   Procedure: ARTERIOVENOUS (AV) FISTULA CREATION ;  Surgeon: Rosetta Posner, MD;  Location: [place];  Service: Vascular;  Laterality: Left;  . AV  FISTULA PLACEMENT Left 10/10/2018   Procedure: INSERTION OF LEFT UPPER ARM brachial to axillary artery ARTERIOVENOUS (AV) GORE-TEX GRAFT using 4-7 stretch graft;  Surgeon: Waynetta Sandy, MD;  Location: [facility];  Service: Vascular;  Laterality: Left;  . CYSTOSCOPY W/ RETROGRADES  11/16/2012   Procedure: CYSTOSCOPY WITH RETROGRADE PYELOGRAM;  Surgeon: Reece Packer, MD;  Location[facility];  Service: Urology;  Laterality: Bilateral;  CYSTOSCOPY,BILATERAL RETROGRADE PYELOGRAM/ REMOVAL LEFT URETERAL STENT/  FULGERATION BLADDER MUCOSA/ INSERTION RIGHT URETERAL STENT  . CYSTOSCOPY W/ URETERAL STENT PLACEMENT  05/28/2012   Procedure: CYSTOSCOPY WITH RETROGRADE PYELOGRAM/URETERAL STENT PLACEMENT;  Surgeon: Reece Packer, MD;  [facility];  Service: Urology;  Laterality: Left;  . CYSTOSCOPY WITH RETROGRADE PYELOGRAM, URETEROSCOPY AND STENT PLACEMENT Right 06/23/2013   Procedure: CYSTOSCOPY WITH RIGHT URETEROSCOPY, RIGHT  RETROGRADE PYELOGRAM, WITH LASER LIPOTRIPSY AND RIGHT URETERAL STENT EXCHANGE ;  Surgeon: Molli Hazard, MD;  [facility];  Service: Urology;  Laterality: Right;  STENT EXCHANGE    . CYSTOSCOPY WITH RETROGRADE PYELOGRAM, URETEROSCOPY AND STENT PLACEMENT Right 11/06/2013   Procedure: CYSTOSCOPY WITH RETROGRADE PYELOGRAM, URETEROSCOPY STONE REMOVAL AND STENT REMOVAL;  Surgeon: Molli Hazard, MD;  [facility];  Service: Urology;  Laterality: Right;  . HOLMIUM LASER APPLICATION N/A 4/00/8676   Procedure: HOLMIUM LASER APPLICATION;  Surgeon: Molli Hazard, MD;  [facility];  Service: Urology;  Laterality: N/A;  . INSERTION OF DIALYSIS CATHETER N/A 09/24/2013   Procedure: INSERTION OF DIALYSIS CATHETER;  Surgeon: Rosetta Posner, MD;  [facility];  Service: Vascular;  Laterality: N/A;  . IR AV DIALY SHUNT INTRO NEEDLE/INTRAC INITIAL W/PTA/STENT/IMG LT Left 08/13/2018  . IR AV DIALY SHUNT INTRO NEEDLE/INTRACATH INITIAL W/PTA/IMG LEFT  04/19/2018  . IR AV DIALY SHUNT INTRO NEEDLE/INTRACATH INITIAL W/PTA/IMG LEFT  08/14/2018  . IR FLUORO GUIDE CV LINE RIGHT  09/17/2018  . IR FLUORO GUIDE CV LINE RIGHT  09/20/2018  . IR US GUIDE VASC ACCESS LEFT  04/19/2018  . IR US GUIDE VASC ACCESS LEFT  08/13/2018  . IR US GUIDE VASC ACCESS LEFT  08/14/2018  . IR US GUIDE VASC ACCESS RIGHT  09/17/2018  . PORT-A-CATH REMOVAL    . PORTACATH PLACEMENT    . REVISON OF ARTERIOVENOUS FISTULA Left 07/25/2016   Procedure: REVISON OF LEFT ARTERIOVENOUS FISTULA;  Surgeon:  Elam Dutch, MD;  Location: [facility];  Service: Vascular;  Laterality: Left;  . THROMBECTOMY AND REVISION OF ARTERIOVENTOUS (AV) GORETEX  GRAFT Left 10/10/2018   Procedure: attempted of THROMBECTOMY OF ARTERIOVENTOUS FISTULA LEFT ARM;  Surgeon: Waynetta Sandy, MD;  Location: [facility];  Service: Vascular;  Laterality: Left;     A IV Location/Drains/Wounds Patient Lines/Drains/Airways Status   Active Line/Drains/Airways    Name:   Placement date:   Placement time:   Site:   Days:   Peripheral IV 01/27/19 Right Antecubital   01/27/19    2007    Antecubital   1   Peripheral IV 01/27/19 Right Forearm   01/27/19    2010    Forearm   1   Fistula / Graft Left Upper arm Arteriovenous fistula   07/25/13    1147    Upper arm   2013   Fistula / Graft Left Upper arm Arteriovenous fistula   -    -    Upper arm      Fistula / Graft Left Upper arm Arteriovenous fistula   -    -  Upper arm      Fistula / Graft Left Upper arm Arteriovenous vein graft   10/10/18    1116    Upper arm   110   Hemodialysis Catheter Left   09/24/13    0953    Internal jugular   1952   Hemodialysis Catheter Internal jugular Double-lumen;Permanent   09/20/18    1439    Internal jugular   130   Incision 11/16/12 Perineum Other (Comment)   11/16/12    0902     2264   Incision 09/24/13 Chest Left   09/24/13    0833     1952   Incision (Closed) 07/25/16 Arm Left   07/25/16    1134     917   Incision (Closed) 09/20/18 Neck Right   09/20/18    1450     130   Incision (Closed) 09/20/18 Chest Right   09/20/18    1450     130   Incision (Closed) 10/10/18 Arm Left   10/10/18    1117     110   Pressure Ulcer 04/30/15 Stage II -  Partial thickness loss of dermis presenting as a shallow open ulcer with a red, pink wound bed without slough.   04/30/15    2358     1369   Wound 04/04/12 Perineum Mid;Upper;Lower Small, dime and nickel sized multiple open- stage II wounds.    04/04/12    1745    Perineum   2490   Wound 04/04/12 Abdomen  Right;Left;Mid Small, dime sized open, stage II wounds.  Necrotic tissue present.    04/04/12    1745    Abdomen   2490   Wound 05/24/12 Other (Comment) Perineum Right;Left perineum and buttocks hydrodenitis   05/24/12    -    Perineum   2440   Wound 08/17/12 Fissure;Excoriated (Scratch marks) Pelvis Medial moist in skin fold with sloughing and fissure formantio.  tissue is firm   08/17/12    0100    Pelvis   2355   Wound 08/17/12 Groin Right moist, sloughing, fissure formation   08/17/12    0100    Groin   2355   Wound 08/17/12 Groin Left moist, pink sloughing skin with fissure formation   08/17/12    0100    Groin   2355   Wound 08/17/12 Anus Medial sloughing, moist fissure with small ulcerations and bloody drainage from peri area to sacrum   08/17/12    0100    Anus   2355   Wound 08/17/12 Laceration Flank Left healing, closed 1cm lasceration in skin fold   08/17/12    -    Flank   2355   Wound 08/17/12 Other (Comment) Chest Medial moist, sloughing, ulcerated pink area between breasts and extending under breasts in skin fold   08/17/12    0100    Chest   2355   Wound Blister (Serous filled);Excoriated (Scratch marks) Breast Bilateral open wounds under breasts   -    -    Breast      Wound 11/24/12 Other (Comment) Perineum   11/24/12    0700    Perineum   2256   Wound 03/07/13 Other (Comment) Perineum Hidradenitis supparative   03/07/13    2100    Perineum   2153   Wound 07/27/13 Skin tear Rib Left;Lower skin tear    07/27/13    0830    Rib   2011  Wound / Incision (Open or Dehisced) 12/21/14 Other (Comment) Perineum Right;Left   12/21/14    2230    Perineum   1499   Wound / Incision (Open or Dehisced) 12/21/14  Buttocks Medial   12/21/14    2230    Buttocks   1499   Wound / Incision (Open or Dehisced) 04/30/15 Other (Comment) Perineum Right;Left multiple dime/nickel sized open/draining wounds   04/30/15    2358    Perineum   1369   Wound / Incision (Open or Dehisced) 08/25/16 Incision - Open;Other  (Comment) Sacrum Right;Left   08/25/16    2130    Sacrum   886          Intake/Output Last 24 hours  Intake/Output Summary (Last 24 hours) at 01/28/2019 1527 Last data filed at 01/28/2019 3300 Gross per 24 hour  Intake 200.44 ml  Output -  Net 200.44 ml    Labs/Imaging Results for orders placed or performed during the hospital encounter of 01/27/19 (from the past 48 hour(s))  Lactic acid, plasma     Status: Abnormal   Collection Time: 01/27/19  8:12 PM  Result Value Ref Range   Lactic Acid, Venous 2.8 (HH) 0.5 - 1.9 mmol/L    Comment: CRITICAL RESULT CALLED TO, READ BACK BY AND VERIFIED WITH: T PHILLIPS,RN 2052 01/27/2019 WBOND Performed at [facility], [place] [address], [place], [place] [address]   Comprehensive metabolic panel     Status: Abnormal   Collection Time: 01/27/19  8:12 PM  Result Value Ref Range   Sodium 131 (L) 135 - 145 mmol/L   Potassium 4.2 3.5 - 5.1 mmol/L   Chloride 95 (L) 98 - 111 mmol/L   CO2 23 22 - 32 mmol/L   Glucose, Bld 281 (H) 70 - 99 mg/dL   BUN 40 (H) 6 - 20 mg/dL   Creatinine, Ser 7.36 (H) 0.44 - 1.00 mg/dL   Calcium 9.0 8.9 - 10.3 mg/dL   Total Protein 9.3 (H) 6.5 - 8.1 g/dL   Albumin 2.8 (L) 3.5 - 5.0 g/dL   AST 21 15 - 41 U/L   ALT 18 0 - 44 U/L   Alkaline Phosphatase 123 38 - 126 U/L   Total Bilirubin 0.4 0.3 - 1.2 mg/dL   GFR calc non Af Amer 6 (L) >60 mL/min   GFR calc Af Amer 6 (L) >60 mL/min   Anion gap 13 5 - 15    Comment: Performed at [facility], [place] [address], [place], [place] [address]  CBC WITH DIFFERENTIAL     Status: Abnormal   Collection Time: 01/27/19  8:12 PM  Result Value Ref Range   WBC 33.0 (H) 4.0 - 10.5 K/uL   RBC 3.63 (L) 3.87 - 5.11 MIL/uL   Hemoglobin 10.2 (L) 12.0 - 15.0 g/dL   HCT 37.9 36.0 - 46.0 %   MCV 104.4 (H) 80.0 - 100.0 fL   MCH 28.1 26.0 - 34.0 pg   MCHC 26.9 (L) 30.0 - 36.0 g/dL   RDW 18.2 (H) 11.5 - 15.5 %   Platelets 340 150 - 400 K/uL   nRBC 0.1 0.0 - 0.2 %   Neutrophils  Relative % 94 %   Neutro Abs 31.1 (H) 1.7 - 7.7 K/uL   Lymphocytes Relative 2 %   Lymphs Abs 0.6 (L) 0.7 - 4.0 K/uL   Monocytes Relative 2 %   Monocytes Absolute 0.6 0.1 - 1.0 K/uL   Eosinophils Relative 0 %  Eosinophils Absolute 0.1 0.0 - 0.5 K/uL   Basophils Relative 0 %   Basophils Absolute 0.1 0.0 - 0.1 K/uL   Immature Granulocytes 2 %   Abs Immature Granulocytes 0.60 (H) 0.00 - 0.07 K/uL   Polychromasia PRESENT     Comment: Performed at [facility] [address] [place], [place] [address]  Blood Culture (routine x 2)     Status: None (Preliminary result)   Collection Time: 01/27/19  8:12 PM  Result Value Ref Range   Specimen Description BLOOD RIGHT ANTECUBITAL    Special Requests      BOTTLES DRAWN AEROBIC AND ANAEROBIC Blood Culture adequate volume   Culture      NO GROWTH < 24 HOURS Performed at [facility], [place] [address], [place], [place] [address]    Report Status PENDING   Blood Culture (routine x 2)     Status: None (Preliminary result)   Collection Time: 01/27/19  8:12 PM  Result Value Ref Range   Specimen Description BLOOD RIGHT WRIST    Special Requests      BOTTLES DRAWN AEROBIC AND ANAEROBIC Blood Culture results may not be optimal due to an inadequate volume of blood received in culture bottles   Culture      NO GROWTH < 24 HOURS Performed at [facility] [address] [place], [place] [address]    Report Status PENDING   I-STAT 7, (LYTES, BLD GAS, ICA, H+H)     Status: Abnormal   Collection Time: 01/27/19  8:29 PM  Result Value Ref Range   pH, Arterial 7.346 (L) 7.350 - 7.450   pCO2 arterial 44.2 32.0 - 48.0 mmHg   pO2, Arterial 115.0 (H) 83.0 - 108.0 mmHg   Bicarbonate 23.5 20.0 - 28.0 mmol/L   TCO2 25 22 - 32 mmol/L   O2 Saturation 98.0 %   Acid-base deficit 1.0 0.0 - 2.0 mmol/L   Sodium 132 (L) 135 - 145 mmol/L   Potassium 4.3 3.5 - 5.1 mmol/L   Calcium, Ion 1.18 1.15 - 1.40 mmol/L   HCT 35.0 (L) 36.0 - 46.0 %    Hemoglobin 11.9 (L) 12.0 - 15.0 g/dL   Patient temperature 103.5 F    Collection site RADIAL, ALLEN'S TEST ACCEPTABLE    Drawn by RT    Sample type ARTERIAL   CBG monitoring, ED     Status: Abnormal   Collection Time: 01/28/19 12:45 AM  Result Value Ref Range   Glucose-Capillary 226 (H) 70 - 99 mg/dL  Lactic acid, plasma     Status: Abnormal   Collection Time: 01/28/19  2:14 AM  Result Value Ref Range   Lactic Acid, Venous 2.5 (HH) 0.5 - 1.9 mmol/L    Comment: CRITICAL RESULT CALLED TO, READ BACK BY AND VERIFIED WITH: K.[facility] 0302 01/28/2019 M.CAMPBELL Performed at [facility], [place] [address], [place], [place] [address]   Brain natriuretic peptide     Status: Abnormal   Collection Time: 01/28/19  2:14 AM  Result Value Ref Range   B Natriuretic Peptide 250.6 (H) 0.0 - 100.0 pg/mL    Comment: Performed at [facility] [address], [place], [place] [address]  Troponin I - ONCE - STAT     Status: Abnormal   Collection Time: 01/28/19  2:14 AM  Result Value Ref Range   Troponin I 0.05 (HH) <0.03 ng/mL    Comment: CRITICAL RESULT CALLED TO, READ BACK BY AND  VERIFIED WITH:  Rich Reining K,RN 01/28/19 0320 WAYK Performed at [facility] [address] [place], [place] [address]   CBC     Status: Abnormal   Collection Time: 01/28/19  3:18 AM  Result Value Ref Range   WBC 37.9 (H) 4.0 - 10.5 K/uL   RBC 3.22 (L) 3.87 - 5.11 MIL/uL   Hemoglobin 9.1 (L) 12.0 - 15.0 g/dL    Comment: REPEATED TO VERIFY   HCT 33.4 (L) 36.0 - 46.0 %   MCV 103.7 (H) 80.0 - 100.0 fL   MCH 28.3 26.0 - 34.0 pg   MCHC 27.2 (L) 30.0 - 36.0 g/dL   RDW 18.1 (H) 11.5 - 15.5 %   Platelets 297 150 - 400 K/uL   nRBC 0.1 0.0 - 0.2 %    Comment: Performed at [facility], [place] [address], [place], [place] [address]  Basic metabolic panel     Status: Abnormal   Collection Time: 01/28/19  3:18 AM  Result Value Ref Range   Sodium 131 (L) 135 - 145 mmol/L   Potassium 4.7 3.5 - 5.1 mmol/L    Chloride 98 98 - 111 mmol/L   CO2 23 22 - 32 mmol/L   Glucose, Bld 199 (H) 70 - 99 mg/dL   BUN 44 (H) 6 - 20 mg/dL   Creatinine, Ser 7.63 (H) 0.44 - 1.00 mg/dL   Calcium 8.7 (L) 8.9 - 10.3 mg/dL   GFR calc non Af Amer 5 (L) >60 mL/min   GFR calc Af Amer 6 (L) >60 mL/min   Anion gap 10 5 - 15    Comment: Performed at [facility] [address], [place], [place] [address]  Lactic acid, plasma     Status: Abnormal   Collection Time: 01/28/19  3:18 AM  Result Value Ref Range   Lactic Acid, Venous 2.6 (HH) 0.5 - 1.9 mmol/L    Comment: CRITICAL RESULT CALLED TO, READ BACK BY AND VERIFIED WITH: Rich Reining [facility] 01/28/19 0352 WAYK Performed at [facility] [address] [place], [place] [address]   CBG monitoring, ED     Status: Abnormal   Collection Time: 01/28/19  4:50 AM  Result Value Ref Range   Glucose-Capillary 149 (H) 70 - 99 mg/dL   Comment 1 Notify RN    Comment 2 Document in Chart   CBG monitoring, ED     Status: Abnormal   Collection Time: 01/28/19  7:40 AM  Result Value Ref Range   Glucose-Capillary 110 (H) 70 - 99 mg/dL   Dg Chest Port 1 View  Result Date: 01/27/2019 CLINICAL DATA:  Fever EXAM: PORTABLE CHEST 1 VIEW COMPARISON:  09/18/2018 FINDINGS: Right-sided central venous catheter tip over the proximal right atrium. Cardiomegaly with vascular congestion. Hazy asymmetric left greater than right opacity. No pneumothorax. Streaky atelectasis at the right base. IMPRESSION: Rotated patient. Enlarged cardiomediastinal silhouette with vascular congestion and hazy left greater than right asymmetric opacity which may reflect edema. Streaky atelectasis at the right base. Electronically Signed   By: Donavan Foil M.D.   On: 01/27/2019 21:16    Pending Labs Unresulted Labs (From admission, onward)    Start     Ordered   01/29/19 0500  Comprehensive metabolic panel  Tomorrow morning,   R     01/28/19 1434   01/29/19 0500  CBC  Tomorrow morning,   R     01/28/19 1434    01/29/19 0500  Lipase, blood  Tomorrow morning,   R     01/28/19 1434   01/27/19 2119  Influenza panel by PCR (type A & B)  ONCE - STAT,   STAT     01/27/19 2118          Vitals/Pain Today's Vitals   01/28/19 1358 01/28/19 1436 01/28/19 1438 01/28/19 1500  BP: (!) 93/40 (!) 115/45 (!) 96/45 (!) 111/40  Pulse: 94  97 96  Resp: (!) 25  19 (!) 24  Temp:      TempSrc:      SpO2:   100% 100%  PainSc:        Isolation Precautions No active isolations  Medications Medications  metroNIDAZOLE (FLAGYL) IVPB 500 mg (0 mg Intravenous Stopped 01/28/19 1415)  vancomycin (VANCOCIN) IVPB 1000 mg/200 mL premix (has no administration in time range)  ceFEPIme (MAXIPIME) 1 g in sodium chloride 0.9 % 100 mL IVPB (has no administration in time range)  ondansetron (ZOFRAN) tablet 4 mg (has no administration in time range)    Or  ondansetron (ZOFRAN) injection 4 mg (has no administration in time range)  sodium chloride flush (NS) 0.9 % injection 3 mL (3 mLs Intravenous Given 01/28/19 1220)  sodium chloride flush (NS) 0.9 % injection 3 mL (has no administration in time range)  0.9 %  sodium chloride infusion (has no administration in time range)  heparin injection 5,000 Units (has no administration in time range)  Chlorhexidine Gluconate Cloth 2 % PADS 6 each (has no administration in time range)  acetaminophen (TYLENOL) tablet 650 mg (has no administration in time range)  aspirin EC tablet 81 mg (has no administration in time range)  b complex-vitamin c-folic acid (NEPHRO-VITE) tablet 1 tablet (has no administration in time range)  Calcium Acetate TABS 667 mg (has no administration in time range)  DULoxetine (CYMBALTA) DR capsule 60 mg (has no administration in time range)  famotidine (PEPCID) tablet 20 mg (has no administration in time range)  levothyroxine (SYNTHROID, LEVOTHROID) tablet 75 mcg (has no administration in time range)  midodrine (PROAMATINE) tablet 10 mg (has no administration in time  range)  pravastatin (PRAVACHOL) tablet 40 mg (has no administration in time range)  sevelamer carbonate (RENVELA) tablet 2,400 mg (has no administration in time range)  sucroferric oxyhydroxide (VELPHORO) chewable tablet 500-1,000 mg (has no administration in time range)  ceFEPIme (MAXIPIME) 1 g in sodium chloride 0.9 % 100 mL IVPB (0 g Intravenous Stopped 01/27/19 2153)  vancomycin (VANCOCIN) 2,500 mg in sodium chloride 0.9 % 500 mL IVPB (0 mg Intravenous Stopped 01/28/19 0015)  sodium chloride 0.9 % bolus 500 mL (0 mLs Intravenous Stopped 01/27/19 2203)    Mobility non-ambulatory Low fall risk   Focused Assessments Pulmonary Assessment Handoff:  Lung sounds: Bilateral Breath Sounds: Diminished L Breath Sounds: Diminished R Breath Sounds: Diminished O2 Device: Nasal Cannula O2 Flow Rate (L/min): 2 L/min      R Recommendations: See Admitting Provider Note  Report given to:   Additional Notes:

## 2019-01-28 NOTE — Progress Notes (Signed)
[name] [id number] Admission Data: 01/28/2019 7:56 PM Attending Provider: Cherene Altes, MD  IWP:[id number], Caren Griffins, MD Consults/ Treatment Team: Treatment Team:  Roney Jaffe, MD  [name] is a 58 y.o. female patient admitted from ED awake, alert  & orientated  X 3,  Full Code, VSS - Blood pressure (!) 99/46, pulse (!) 104, temperature (!) 100.4 F (38 C), temperature source Oral, resp. rate (!) 24, last menstrual period 06/13/2013, SpO2 97 %., O2    3 L nasal cannular, no c/o shortness of breath, no c/o chest pain, no distress noted. Tele # 18 placed and pt is currently running:normal sinus rhythm.   IV site WDL:  forearm right, condition patent and no redness and antecubital right, condition patent and no redness with a transparent dsg that's clean dry and intact.  Allergies:   Allergies  Allergen Reactions  . Rosiglitazone Other (See Comments)    "Avandia" = Reaction not recalled  . Amoxicillin Rash and Other (See Comments)    Tolerated Zosyn 01/2013. [place] Has patient had a PCN reaction causing immediate rash, facial/tongue/throat swelling, SOB or lightheadedness with hypotension: Yes Has patient had a PCN reaction causing severe rash involving mucus membranes or skin necrosis: No Has patient had a PCN reaction that required hospitalization: No Has patient had a PCN reaction occurring within the last 10 years: No If all of the above answers are "NO", then may proceed with Cephalosporin use.   Marland Kitchen Amoxicillin-Pot Clavulanate Diarrhea     Past Medical History:  Diagnosis Date  . Chronic diastolic CHF (congestive heart failure) ([place])   . DCM (dilated cardiomyopathy) ([place])    a. presumed nonischemic - no prior cath/nuc. EF 35-40% in 2011, most recently  60-65% by echo 2017.  Marland Kitchen Depression   . DM type 2 (diabetes mellitus, type 2) ([facility])    diet controlledwith retinopathy and nephropathy  . ESRD on hemodialysis ([place]) 08/21/2017  . GERD (gastroesophageal reflux  disease)   . H/O cardiac arrest 10/2012  . Hard of hearing 10/2012   now wears 2 hearing aids  . History of hemodialysis dec 2013  . HTN (hypertension)   . Hydradenitis   . Hyperkalemia   . Hyperlipidemia   . Hypothyroidism   . Iron deficiency anemia   . Morbid obesity ([place])   . Multinodular goiter   . Neuropathic pain 08/21/2017  . Orthostatic hypotension 06/27/2018  . PAF (paroxysmal atrial fibrillation) ([place])   . Paroxysmal atrial flutter ([place])   . Pneumonia Nov 10, 2012  . Sleep apnea    Intolerant to CPAP  . Tinnitus 12/26/2016  . Typical atrial flutter ([place]) 04/10/2017  . Urinary tract infection    taking antibiotics for 3 days prior to surgery     Pt orientation to unit, room and routine. Information packet given to patient/family and safety video watched.  Admission INP armband ID verified with patient/family, and in place. SR up x 2, fall risk assessment complete with Patient and family verbalizing understanding of risks associated with falls. Pt verbalizes an understanding of how to use the call bell and to call for help before getting out of bed.  Moisture associated breakdown around buttocks and groin area. Foam dressing placed on buttocks.     Will cont to monitor and assist as needed.  Tresa Endo, RN 01/28/2019 7:56 PM

## 2019-01-28 NOTE — ED Notes (Signed)
Pt returned from CT °

## 2019-01-29 ENCOUNTER — Inpatient Hospital Stay (HOSPITAL_COMMUNITY): Payer: Medicare Other

## 2019-01-29 LAB — COMPREHENSIVE METABOLIC PANEL
ALT: 14 U/L (ref 0–44)
AST: 19 U/L (ref 15–41)
Albumin: 2.4 g/dL — ABNORMAL LOW (ref 3.5–5.0)
Alkaline Phosphatase: 122 U/L (ref 38–126)
Anion gap: 12 (ref 5–15)
BUN: 61 mg/dL — ABNORMAL HIGH (ref 6–20)
CO2: 22 mmol/L (ref 22–32)
Calcium: 8.9 mg/dL (ref 8.9–10.3)
Chloride: 99 mmol/L (ref 98–111)
Creatinine, Ser: 9.4 mg/dL — ABNORMAL HIGH (ref 0.44–1.00)
GFR calc Af Amer: 5 mL/min — ABNORMAL LOW (ref >60)
GFR calc non Af Amer: 4 mL/min — ABNORMAL LOW (ref >60)
GLUCOSE: 119 mg/dL — AB (ref 70–99)
Potassium: 4.6 mmol/L (ref 3.5–5.1)
Sodium: 133 mmol/L — ABNORMAL LOW (ref 135–145)
Total Bilirubin: 0.7 mg/dL (ref 0.3–1.2)
Total Protein: 8.6 g/dL — ABNORMAL HIGH (ref 6.5–8.1)

## 2019-01-29 LAB — CBC
HCT: 36.5 % (ref 36.0–46.0)
Hemoglobin: 10.1 g/dL — ABNORMAL LOW (ref 12.0–15.0)
MCH: 28.6 pg (ref 26.0–34.0)
MCHC: 27.7 g/dL — ABNORMAL LOW (ref 30.0–36.0)
MCV: 103.4 fL — AB (ref 80.0–100.0)
Platelets: 276 10*3/uL (ref 150–400)
RBC: 3.53 MIL/uL — ABNORMAL LOW (ref 3.87–5.11)
RDW: 18.2 % — ABNORMAL HIGH (ref 11.5–15.5)
WBC: 30.6 10*3/uL — ABNORMAL HIGH (ref 4.0–10.5)
nRBC: 0 % (ref 0.0–0.2)

## 2019-01-29 LAB — GLUCOSE, CAPILLARY
GLUCOSE-CAPILLARY: 163 mg/dL — AB (ref 70–99)
Glucose-Capillary: 112 mg/dL — ABNORMAL HIGH (ref 70–99)
Glucose-Capillary: 195 mg/dL — ABNORMAL HIGH (ref 70–99)

## 2019-01-29 LAB — INFLUENZA PANEL BY PCR (TYPE A & B)
Influenza A By PCR: NEGATIVE
Influenza B By PCR: NEGATIVE

## 2019-01-29 LAB — HEPATITIS B SURFACE ANTIGEN: Hepatitis B Surface Ag: NEGATIVE

## 2019-01-29 LAB — LIPASE, BLOOD: Lipase: 25 U/L (ref 11–51)

## 2019-01-29 MED ORDER — HEPARIN SODIUM (PORCINE) 1000 UNIT/ML IJ SOLN
INTRAMUSCULAR | Status: AC
  Administered 2019-01-29: 5000 [IU] via INTRAVENOUS_CENTRAL
  Filled 2019-01-29: qty 5

## 2019-01-29 MED ORDER — HEPARIN SODIUM (PORCINE) 1000 UNIT/ML DIALYSIS
5000.0000 [IU] | Freq: Once | INTRAMUSCULAR | Status: AC
  Administered 2019-01-29: 5000 [IU] via INTRAVENOUS_CENTRAL

## 2019-01-29 MED ORDER — VANCOMYCIN HCL IN DEXTROSE 1-5 GM/200ML-% IV SOLN
INTRAVENOUS | Status: AC
  Administered 2019-01-29: 1000 mg via INTRAVENOUS
  Filled 2019-01-29: qty 200

## 2019-01-29 MED ORDER — INSULIN ASPART 100 UNIT/ML ~~LOC~~ SOLN
0.0000 [IU] | Freq: Three times a day (TID) | SUBCUTANEOUS | Status: DC
  Administered 2019-01-29: 2 [IU] via SUBCUTANEOUS

## 2019-01-29 MED ORDER — OXYCODONE-ACETAMINOPHEN 5-325 MG PO TABS
1.0000 | ORAL_TABLET | Freq: Four times a day (QID) | ORAL | Status: DC | PRN
  Administered 2019-01-29: 1 via ORAL
  Filled 2019-01-29: qty 1

## 2019-01-29 MED ORDER — MIDODRINE HCL 5 MG PO TABS
ORAL_TABLET | ORAL | Status: AC
  Administered 2019-01-29: 10 mg via ORAL
  Filled 2019-01-29: qty 2

## 2019-01-29 NOTE — Progress Notes (Addendum)
PROGRESS NOTE        PATIENT DETAILS Name: [name] Age: 58 y.o. Sex: female Date of Birth: [birth year] Admit Date: 01/27/2019 Admitting Physician Cherene Altes, MD LOV:[id number], Caren Griffins, MD  Brief Narrative: Patient is a 58 y.o. female with history of ESRD on HD, and retinitis involving the groin and the buttocks, PAF, chronic diastolic heart failure, morbid obesity presented with fever, lethargy and respiratory distress-thought to have sepsis secondary to pneumonia and hydradenitis.   Subjective: Feels much better-awake and alert.  Afebrile overnight.  Denies any chest pain or shortness of breath.  Assessment/Plan: Sepsis secondary to pneumonia +/-hidradenitis: Sepsis pathophysiology has improved, leukocytosis slowly downtrending-afebrile overnight-blood cultures negative so far.  Continue with empiric vancomycin Flagyl and cefepime for now-if clinical improvement continues-we will transition to oral antimicrobial therapy.  Acute on chronic hypoxic respiratory failure (on home O2 2 L mostly at night): Suspect acute respiratory failure secondary to pneumonia-improving-we will ask RN to titrate down oxygen to usual home regimen.  Acute metabolic encephalopathy: Suspect secondary to sepsis, hypoxia.  She is completely awake and alert today.  ESRD on HD MWF: Nephrology following and directing care  Hidradenitis suppurativa involving the groin/buttock: Appreciate wound care input-continue per wound care instructions.  PAF: Maintaining sinus rhythm-not on any rate control agents-due to hypotension with HD.  She is not on anticoagulation due to open skin lesions in her groin/buttock from hidradenitis which caused constant bleeding and wheezing (per last outpatient cardiology note August 2019).  DM-2: CBGs slightly on the higher side-oral hypoglycemic agents on hold-start SSI and follow.  Hypothyroidism: Continue levothyroxine  Hypotension during HD:  Maintained on midodrine.  Dyslipidemia: Continue statin  Morbid obesity  DVT Prophylaxis: Prophylactic Heparin   Code Status: Full code  Family Communication: None at bedside  Disposition Plan: Remain inpatient  Antimicrobial agents: Anti-infectives (From admission, onward)   Start     Dose/Rate Route Frequency Ordered Stop   01/29/19 1800  vancomycin (VANCOCIN) IVPB 1000 mg/200 mL premix     1,000 mg 200 mL/hr over 60 Minutes Intravenous Every M-W-F (Hemodialysis) 01/27/19 2035     01/28/19 2000  ceFEPIme (MAXIPIME) 1 g in sodium chloride 0.9 % 100 mL IVPB     1 g 200 mL/hr over 30 Minutes Intravenous Every 24 hours 01/27/19 2035     01/27/19 2015  ceFEPIme (MAXIPIME) 2 g in sodium chloride 0.9 % 100 mL IVPB  Status:  Discontinued     2 g 200 mL/hr over 30 Minutes Intravenous  Once 01/27/19 2002 01/27/19 2007   01/27/19 2015  metroNIDAZOLE (FLAGYL) IVPB 500 mg     500 mg 100 mL/hr over 60 Minutes Intravenous Every 8 hours 01/27/19 2002     01/27/19 2015  vancomycin (VANCOCIN) IVPB 1000 mg/200 mL premix  Status:  Discontinued     1,000 mg 200 mL/hr over 60 Minutes Intravenous  Once 01/27/19 2002 01/27/19 2011   01/27/19 2015  ceFEPIme (MAXIPIME) 1 g in sodium chloride 0.9 % 100 mL IVPB     1 g 200 mL/hr over 30 Minutes Intravenous  Once 01/27/19 2007 01/27/19 2153   01/27/19 2015  vancomycin (VANCOCIN) 2,500 mg in sodium chloride 0.9 % 500 mL IVPB     2,500 mg 250 mL/hr over 120 Minutes Intravenous  Once 01/27/19 2011 01/28/19 0015      Procedures: None  CONSULTS:  nephrology  PCCM  Time spent: 25 minutes-Greater than 50% of this time was spent in counseling, explanation of diagnosis, planning of further management, and coordination of care.  MEDICATIONS: Scheduled Meds: . aspirin EC  81 mg Oral QHS  . calcium acetate  667 mg Oral TID WC  . Chlorhexidine Gluconate Cloth  6 each Topical Q0600  . DULoxetine  60 mg Oral Daily  . famotidine  20 mg Oral QHS  .  heparin  5,000 Units Subcutaneous Q8H  . levothyroxine  75 mcg Oral QAC breakfast  . midodrine  10 mg Oral Q M,W,F-HD  . multivitamin  1 tablet Oral QHS  . pravastatin  40 mg Oral QPM  . sevelamer carbonate  2,400 mg Oral TID WC  . sodium chloride flush  3 mL Intravenous Q12H  . sucroferric oxyhydroxide  1,000 mg Oral TID WC   Continuous Infusions: . sodium chloride    . ceFEPime (MAXIPIME) IV 1 g (01/28/19 2006)  . metronidazole 500 mg (01/29/19 1337)  . vancomycin Stopped (01/29/19 1201)   PRN Meds:.sodium chloride, acetaminophen, ondansetron **OR** ondansetron (ZOFRAN) IV, oxyCODONE, sodium chloride flush   PHYSICAL EXAM: Vital signs: Vitals:   01/29/19 1130 01/29/19 1200 01/29/19 1233 01/29/19 1423  BP: (!) 77/20 (!) 86/32 (!) 95/45   Pulse: 89 89 92 90  Resp:   18 16  Temp:   98.4 F (36.9 C) 98.4 F (36.9 C)  TempSrc:   Oral Oral  SpO2:   98% 100%   There were no vitals filed for this visit. There is no height or weight on file to calculate BMI.   General appearance :Awake, alert, not in any distress.  HEENT: Atraumatic and Normocephalic Neck: supple Resp:Good air entry bilaterally, no added sounds  CVS: S1 S2 regular, no murmurs.  GI: Bowel sounds present, Non tender-obese abdomen Extremities: B/L Lower Ext shows no edema, both legs are warm to touch Neurology:  speech clear,Non focal, sensation is grossly intact. Musculoskeletal:No digital cyanosis Skin:No Rash, warm and dry  I have personally reviewed following labs and imaging studies  LABORATORY DATA: CBC: Recent Labs  Lab 01/27/19 2012 01/27/19 2029 01/28/19 0318 01/29/19 0352  WBC 33.0*  --  37.9* 30.6*  NEUTROABS 31.1*  --   --   --   HGB 10.2* 11.9* 9.1* 10.1*  HCT 37.9 35.0* 33.4* 36.5  MCV 104.4*  --  103.7* 103.4*  PLT 340  --  297 408    Basic Metabolic Panel: Recent Labs  Lab 01/27/19 2012 01/27/19 2029 01/28/19 0318 01/29/19 0352  NA 131* 132* 131* 133*  K 4.2 4.3 4.7 4.6    CL 95*  --  98 99  CO2 23  --  23 22  GLUCOSE 281*  --  199* 119*  BUN 40*  --  44* 61*  CREATININE 7.36*  --  7.63* 9.40*  CALCIUM 9.0  --  8.7* 8.9    GFR: Estimated Creatinine Clearance: 11.2 mL/min (A) (by C-G formula based on SCr of 9.4 mg/dL (H)).  Liver Function Tests: Recent Labs  Lab 01/27/19 2012 01/29/19 0352  AST 21 19  ALT 18 14  ALKPHOS 123 122  BILITOT 0.4 0.7  PROT 9.3* 8.6*  ALBUMIN 2.8* 2.4*   Recent Labs  Lab 01/29/19 0352  LIPASE 25   No results for input(s): AMMONIA in the last 168 hours.  Coagulation Profile: No results for input(s): INR, PROTIME in the last 168 hours.  Cardiac Enzymes: Recent  Labs  Lab 01/28/19 0214  TROPONINI 0.05*    BNP (last 3 results) No results for input(s): PROBNP in the last 8760 hours.  HbA1C: No results for input(s): HGBA1C in the last 72 hours.  CBG: Recent Labs  Lab 01/28/19 0045 01/28/19 0450 01/28/19 0740 01/28/19 2316 01/29/19 0451  GLUCAP 226* 149* 110* 144* 112*    Lipid Profile: No results for input(s): CHOL, HDL, LDLCALC, TRIG, CHOLHDL, LDLDIRECT in the last 72 hours.  Thyroid Function Tests: No results for input(s): TSH, T4TOTAL, FREET4, T3FREE, THYROIDAB in the last 72 hours.  Anemia Panel: No results for input(s): VITAMINB12, FOLATE, FERRITIN, TIBC, IRON, RETICCTPCT in the last 72 hours.  Urine analysis:    Component Value Date/Time   COLORURINE YELLOW 07/18/2013 0831   APPEARANCEUR CLOUDY (A) 07/18/2013 0831   LABSPEC 1.012 07/18/2013 0831   PHURINE 5.0 07/18/2013 0831   GLUCOSEU NEGATIVE 07/18/2013 0831   HGBUR SMALL (A) 07/18/2013 0831   BILIRUBINUR NEGATIVE 07/18/2013 0831   KETONESUR NEGATIVE 07/18/2013 0831   PROTEINUR 30 (A) 07/18/2013 0831   UROBILINOGEN 0.2 07/18/2013 0831   NITRITE NEGATIVE 07/18/2013 0831   LEUKOCYTESUR MODERATE (A) 07/18/2013 0831    Sepsis Labs: Lactic Acid, Venous    Component Value Date/Time   LATICACIDVEN 2.6 ([place]) 01/28/2019 0318     MICROBIOLOGY: Recent Results (from the past 240 hour(s))  Blood Culture (routine x 2)     Status: None (Preliminary result)   Collection Time: 01/27/19  8:12 PM  Result Value Ref Range Status   Specimen Description BLOOD RIGHT ANTECUBITAL  Final   Special Requests   Final    BOTTLES DRAWN AEROBIC AND ANAEROBIC Blood Culture adequate volume   Culture   Final    NO GROWTH 2 DAYS Performed at [facility], [place] [address], [place], [place] [address]    Report Status PENDING  Incomplete  Blood Culture (routine x 2)     Status: None (Preliminary result)   Collection Time: 01/27/19  8:12 PM  Result Value Ref Range Status   Specimen Description BLOOD RIGHT WRIST  Final   Special Requests   Final    BOTTLES DRAWN AEROBIC AND ANAEROBIC Blood Culture results may not be optimal due to an inadequate volume of blood received in culture bottles   Culture   Final    NO GROWTH 2 DAYS Performed at [facility], [place] [address], [place],  [address]    Report Status PENDING  Incomplete    RADIOLOGY STUDIES/RESULTS: Dg Chest Port 1 View  Result Date: 01/27/2019 CLINICAL DATA:  Fever EXAM: PORTABLE CHEST 1 VIEW COMPARISON:  09/18/2018 FINDINGS: Right-sided central venous catheter tip over the proximal right atrium. Cardiomegaly with vascular congestion. Hazy asymmetric left greater than right opacity. No pneumothorax. Streaky atelectasis at the right base. IMPRESSION: Rotated patient. Enlarged cardiomediastinal silhouette with vascular congestion and hazy left greater than right asymmetric opacity which may reflect edema. Streaky atelectasis at the right base. Electronically Signed   By: Donavan Foil M.D.   On: 01/27/2019 21:16   Vas US Duplex Dialysis Access (avf, Avg)  Result Date: 01/23/2019 DIALYSIS ACCESS Reason for Exam: Unable to dialyze through AVF/AVG. Access Site: Left Upper Extremity. Access Type: Brachial artery to axillary vein AVG. History: Placed 10/10/18 afater  failed attempt at thrombectomy of AVF. Performing Technologist: Ralene Cork RVT  Examination Guidelines: A complete evaluation includes B-mode imaging, spectral Doppler, color Doppler, and power Doppler as needed of all accessible portions of  each vessel. Unilateral testing is considered an integral part of a complete examination. Limited examinations for reoccurring indications may be performed as noted.  Findings:   +--------------------+----------+-----------------+----------------------------+ AVG                 PSV (cm/s)Flow Vol (mL/min)          Describe           +--------------------+----------+-----------------+----------------------------+ Native artery inflow   122                       waveform consistent with                                                        graft occlusion        +--------------------+----------+-----------------+----------------------------+ Arterial anastomosis                                     occluded           +--------------------+----------+-----------------+----------------------------+ Prox graft                                               occluded           +--------------------+----------+-----------------+----------------------------+ Mid graft                                                occluded           +--------------------+----------+-----------------+----------------------------+ Distal graft                                             occluded           +--------------------+----------+-----------------+----------------------------+ Venous anastomosis                                       occluded           +--------------------+----------+-----------------+----------------------------+ Venous outflow                                           occluded           +--------------------+----------+-----------------+----------------------------+  Summary: Occluded left brachial artery to axillary vein AVG. The  outflow vein appears thrombosed at he anastomosis, but appears patent in the proximal axilla. Technically limted by body habitus.  *See table(s) above for measurements and observations.  Diagnosing physician: Ruta Hinds MD Electronically signed by Ruta Hinds MD on 01/23/2019 at 2:20:07 PM.   --------------------------------------------------------------------------------   Final      LOS: 1 day   Oren Binet, MD  [facility]  If 7PM-7AM, please contact night-coverage  Please page via [email or web address]  Go to [email or web address] and use [place]'s  universal password to access. If you do not have the password, please contact the hospital operator.  Locate the [facility] provider you are looking for under [facility] and page to a number that you can be directly reached. If you still have difficulty reaching the provider, please page the [facility] (Director on Call) for the Hospitalists listed on amion for assistance.  01/29/2019, 2:49 PM

## 2019-01-29 NOTE — Progress Notes (Signed)
[facility] Progress Note  Subjective: feeling better, temp's down 103 > 98 over night, WBC down to 30k.   Vitals:   01/29/19 1045 01/29/19 1100 01/29/19 1115 01/29/19 1130  BP: (!) 80/31 (!) 101/28 (!) 78/31 (!) 77/20  Pulse: 98 75 92 89  Resp:      Temp:      TempSrc:      SpO2:        Inpatient medications: . aspirin EC  81 mg Oral QHS  . calcium acetate  667 mg Oral TID WC  . Chlorhexidine Gluconate Cloth  6 each Topical Q0600  . DULoxetine  60 mg Oral Daily  . famotidine  20 mg Oral QHS  . heparin  5,000 Units Subcutaneous Q8H  . levothyroxine  75 mcg Oral QAC breakfast  . midodrine  10 mg Oral Q M,W,F-HD  . multivitamin  1 tablet Oral QHS  . pravastatin  40 mg Oral QPM  . sevelamer carbonate  2,400 mg Oral TID WC  . sodium chloride flush  3 mL Intravenous Q12H  . sucroferric oxyhydroxide  1,000 mg Oral TID WC   . sodium chloride    . ceFEPime (MAXIPIME) IV 1 g (01/28/19 2006)  . metronidazole 500 mg (01/29/19 0503)  . vancomycin 1,000 mg (01/29/19 1101)   sodium chloride, acetaminophen, ondansetron **OR** ondansetron (ZOFRAN) IV, oxyCODONE, sodium chloride flush  Iron/TIBC/Ferritin/ %Sat    Component Value Date/Time   IRON 34 (L) 12/22/2014 0756   TIBC 144 (L) 12/22/2014 0756   FERRITIN 780 (H) 12/22/2014 0756   IRONPCTSAT 24 12/22/2014 0756   IRONPCTSAT 10 (L) 06/01/2008 1547    Exam: GEN: Morbidly obese female on BiPAP, awake, interactive ENT: NCAT EYES: EOMI CV: Regular, normal S1-S2 PULM: clear lungs bilat today ABD: Obese, significant central adiposity, soft SKIN: Right IJ HD tract without tenderness, erythema, edema; bandage clean dry and intact EXT: No lower extremity edema   home meds:  - aspirin 81/ pravastatin 40 hs  - diltiazem 30mg  q 4h prn rapid HR/ midodrine 10 tid  - glipizide 2.5 qd  - duloxetine 60 qd/ oxycodone IR prn  - sucroferric oxyhydroxide ac tid/ sevelamer carb ac tid/ calc acetate tid ac  - levothyroxine 75  ug qam/ famotidine 20 hs  - cipro qd/ doxy bid    MWF [facility]  4h 65min   154.5kg   500/800  F200  2/2.25 bath   Hep 5000  - calcitriol 3 ug tiw  - mircera 75 ug every 2 wks  Summary 58 year old female ESRD presenting with hypoxia, acute respiratory distress, asymmetric infiltrates chest x-ray, high fever and marked leukocytosis; most consistent with HCAP.     Assessment/ Plan: 1. ESRD: MWF dialysis.  HD today, min UF.  2. Fevers/ ^^WBC: c/w HCAP, on IV abx per primary team 3. Acute / chron resp failure: improving 4. Chronic TDC: VVS following as outpt for new access. No bacteremia so line is not likely to be the cause of fevers.  5. Anemia ckd:  Hb stable 6. CKD-BMD 7. Chronic hypotension: on midodrine with HD  8. H/o hidradenitis 9. H/o atrial flutter/ afib paroxsymal: dilt prn 10. NICM EF 40%    [facility] 01/29/2019, 12:14 PM  Recent Labs  Lab 01/27/19 2012  01/28/19 0318 01/29/19 0352  NA 131*   < > 131* 133*  K 4.2   < > 4.7 4.6  CL 95*  --  98 99  CO2 23  --  23 22  GLUCOSE 281*  --  199* 119*  BUN 40*  --  44* 61*  CREATININE 7.36*  --  7.63* 9.40*  CALCIUM 9.0  --  8.7* 8.9  ALBUMIN 2.8*  --   --  2.4*   < > = values in this interval not displayed.   Recent Labs  Lab 01/27/19 2012 01/29/19 0352  AST 21 19  ALT 18 14  ALKPHOS 123 122  BILITOT 0.4 0.7  PROT 9.3* 8.6*   Recent Labs  Lab 01/27/19 2012  01/28/19 0318 01/29/19 0352  WBC 33.0*  --  37.9* 30.6*  NEUTROABS 31.1*  --   --   --   HGB 10.2*   < > 9.1* 10.1*  HCT 37.9   < > 33.4* 36.5  MCV 104.4*  --  103.7* 103.4*  PLT 340  --  297 276   < > = values in this interval not displayed.

## 2019-01-29 NOTE — Progress Notes (Signed)
PT Cancellation Note  Patient Details Name: [name] MRN: [id number] DOB: Dec 23, 1960   Cancelled Treatment:    Reason Eval/Treat Not Completed: (P) Patient declined, no reason specified(Pt refused at this time.  reports she has been up all day and just back to bed 30 min ago.  Agreeable to ambulate next session.  Will f/u per POC.)   Estephania Licciardi Eli Hose 01/29/2019, 4:29 PM  Kataleya Zaugg Stann [place], PTA Acute Rehabilitation Services Pager [phone] Office [phone]

## 2019-01-30 ENCOUNTER — Encounter (HOSPITAL_COMMUNITY): Payer: Self-pay

## 2019-01-30 ENCOUNTER — Other Ambulatory Visit: Payer: Self-pay

## 2019-01-30 LAB — RENAL FUNCTION PANEL
Albumin: 2.1 g/dL — ABNORMAL LOW (ref 3.5–5.0)
Anion gap: 9 (ref 5–15)
BUN: 43 mg/dL — AB (ref 6–20)
CO2: 27 mmol/L (ref 22–32)
Calcium: 8.7 mg/dL — ABNORMAL LOW (ref 8.9–10.3)
Chloride: 98 mmol/L (ref 98–111)
Creatinine, Ser: 7.25 mg/dL — ABNORMAL HIGH (ref 0.44–1.00)
GFR calc Af Amer: 7 mL/min — ABNORMAL LOW (ref >60)
GFR calc non Af Amer: 6 mL/min — ABNORMAL LOW (ref >60)
Glucose, Bld: 166 mg/dL — ABNORMAL HIGH (ref 70–99)
POTASSIUM: 3.7 mmol/L (ref 3.5–5.1)
Phosphorus: 2.9 mg/dL (ref 2.5–4.6)
Sodium: 134 mmol/L — ABNORMAL LOW (ref 135–145)

## 2019-01-30 LAB — CBC
HCT: 31.9 % — ABNORMAL LOW (ref 36.0–46.0)
HCT: 31.3 % — ABNORMAL LOW (ref 36.0–46.0)
HEMOGLOBIN: 8.5 g/dL — AB (ref 12.0–15.0)
Hemoglobin: 8.6 g/dL — ABNORMAL LOW (ref 12.0–15.0)
MCH: 27.7 pg (ref 26.0–34.0)
MCH: 28.1 pg (ref 26.0–34.0)
MCHC: 27 g/dL — ABNORMAL LOW (ref 30.0–36.0)
MCHC: 27.2 g/dL — ABNORMAL LOW (ref 30.0–36.0)
MCV: 102.6 fL — ABNORMAL HIGH (ref 80.0–100.0)
MCV: 103.6 fL — ABNORMAL HIGH (ref 80.0–100.0)
Platelets: 267 10*3/uL (ref 150–400)
Platelets: 264 10*3/uL (ref 150–400)
RBC: 3.11 MIL/uL — AB (ref 3.87–5.11)
RBC: 3.02 MIL/uL — ABNORMAL LOW (ref 3.87–5.11)
RDW: 18 % — ABNORMAL HIGH (ref 11.5–15.5)
RDW: 17.9 % — ABNORMAL HIGH (ref 11.5–15.5)
WBC: 24.8 10*3/uL — ABNORMAL HIGH (ref 4.0–10.5)
WBC: 20.6 10*3/uL — ABNORMAL HIGH (ref 4.0–10.5)
nRBC: 0.1 % (ref 0.0–0.2)
nRBC: 0.1 % (ref 0.0–0.2)

## 2019-01-30 LAB — GLUCOSE, CAPILLARY
Glucose-Capillary: 132 mg/dL — ABNORMAL HIGH (ref 70–99)
Glucose-Capillary: 145 mg/dL — ABNORMAL HIGH (ref 70–99)
Glucose-Capillary: 187 mg/dL — ABNORMAL HIGH (ref 70–99)

## 2019-01-30 MED ORDER — LIDOCAINE HCL (PF) 1 % IJ SOLN
5.0000 mL | INTRAMUSCULAR | Status: DC | PRN
  Filled 2019-01-30: qty 5

## 2019-01-30 MED ORDER — HEPARIN SODIUM (PORCINE) 1000 UNIT/ML DIALYSIS
1000.0000 [IU] | INTRAMUSCULAR | Status: DC | PRN
  Administered 2019-01-30: 3200 [IU] via INTRAVENOUS_CENTRAL
  Filled 2019-01-30 (×2): qty 1

## 2019-01-30 MED ORDER — ALTEPLASE 2 MG IJ SOLR
2.0000 mg | Freq: Once | INTRAMUSCULAR | Status: DC | PRN
  Filled 2019-01-30: qty 2

## 2019-01-30 MED ORDER — LIDOCAINE-PRILOCAINE 2.5-2.5 % EX CREA: 1.0000 "application " | TOPICAL_CREAM | CUTANEOUS | Status: DC | PRN

## 2019-01-30 MED ORDER — PENTAFLUOROPROP-TETRAFLUOROETH EX AERO: 1.0000 "application " | INHALATION_SPRAY | CUTANEOUS | Status: DC | PRN

## 2019-01-30 MED ORDER — SODIUM CHLORIDE 0.9 % IV SOLN: 100.0000 mL | INTRAVENOUS | Status: DC | PRN

## 2019-01-30 MED ORDER — CHLORHEXIDINE GLUCONATE CLOTH 2 % EX PADS
6.0000 | MEDICATED_PAD | Freq: Every day | CUTANEOUS | Status: DC
  Administered 2019-01-31: 6 via TOPICAL

## 2019-01-30 MED ORDER — MIDODRINE HCL 5 MG PO TABS
ORAL_TABLET | ORAL | Status: AC
  Filled 2019-01-30: qty 2

## 2019-01-30 MED ORDER — HEPARIN SODIUM (PORCINE) 1000 UNIT/ML IJ SOLN
INTRAMUSCULAR | Status: AC
  Administered 2019-01-30: 3200 [IU] via INTRAVENOUS_CENTRAL
  Filled 2019-01-30: qty 4

## 2019-01-30 MED ORDER — HEPARIN SODIUM (PORCINE) 1000 UNIT/ML IJ SOLN
INTRAMUSCULAR | Status: AC
  Administered 2019-01-30: 5000 [IU] via INTRAVENOUS_CENTRAL
  Filled 2019-01-30: qty 5

## 2019-01-30 MED ORDER — HEPARIN SODIUM (PORCINE) 1000 UNIT/ML DIALYSIS
5000.0000 [IU] | INTRAMUSCULAR | Status: DC | PRN
  Administered 2019-01-30: 5000 [IU] via INTRAVENOUS_CENTRAL
  Filled 2019-01-30: qty 5

## 2019-01-30 NOTE — Progress Notes (Signed)
PROGRESS NOTE        PATIENT DETAILS Name: [name] Age: 58 y.o. Sex: female Date of Birth: Jul 18, 1961 Admit Date: 01/27/2019 Admitting Physician Cherene Altes, MD JEH:UDJSH, Caren Griffins, MD  Brief Narrative: Patient is a 58 y.o. female with history of ESRD on HD, and retinitis involving the groin and the buttocks, PAF, chronic diastolic heart failure, morbid obesity presented with fever, lethargy and respiratory distress-thought to have sepsis secondary to pneumonia and hydradenitis.   Subjective: No major issues overnight-initially very hesitant to allow me to look at her chronic hidradenitis wounds in the groin (chaperone present-Lindsay Jones-PA-S)-but then allowed me to take a look-no obvious purulent discharge-wounds appear chronic.  Assessment/Plan: Sepsis secondary to pneumonia +/-hidradenitis: Sepsis pathophysiology has improved-leukocytosis is downtrending-all cultures negative so far.  Will discuss with infectious disease over the phone as to what antimicrobial therapy to discharge this patient on.  For now continue with vancomycin, cefepime and Flagyl.   Acute on chronic hypoxic respiratory failure (on home O2 2 L mostly at night): Likely secondary to pneumonia +/-pulmonary edema in the setting of missed HD-markedly better-continue to titrate off oxygen to usual home regimen.   Acute metabolic encephalopathy: Suspect secondary to sepsis, hypoxia.  She is completely awake and alert today.  ESRD on HD MWF: Nephrology following and directing care  Hidradenitis suppurativa involving the groin/buttock: Appreciate wound care input-continue per wound care instructions.  PAF: Maintaining sinus rhythm-not on any rate control agents-due to hypotension with HD.  She is not on anticoagulation due to open skin lesions in her groin/buttock from hidradenitis which caused constant bleeding and wheezing (per last outpatient cardiology note August 2019).  DM-2:  CBGs stable-continue SSI.    Hypothyroidism: Renew levothyroxine  Hypotension during HD: Maintained on midodrine.  Dyslipidemia: Continue statin  Morbid obesity  DVT Prophylaxis: Prophylactic Heparin   Code Status: Full code  Family Communication: None at bedside  Disposition Plan: Remain inpatient-Home most likely on 3/6 if clinical improvement continues  Antimicrobial agents: Anti-infectives (From admission, onward)   Start     Dose/Rate Route Frequency Ordered Stop   01/29/19 1800  vancomycin (VANCOCIN) IVPB 1000 mg/200 mL premix     1,000 mg 200 mL/hr over 60 Minutes Intravenous Every M-W-F (Hemodialysis) 01/27/19 2035     01/28/19 2000  ceFEPIme (MAXIPIME) 1 g in sodium chloride 0.9 % 100 mL IVPB     1 g 200 mL/hr over 30 Minutes Intravenous Every 24 hours 01/27/19 2035     01/27/19 2015  ceFEPIme (MAXIPIME) 2 g in sodium chloride 0.9 % 100 mL IVPB  Status:  Discontinued     2 g 200 mL/hr over 30 Minutes Intravenous  Once 01/27/19 2002 01/27/19 2007   01/27/19 2015  metroNIDAZOLE (FLAGYL) IVPB 500 mg     500 mg 100 mL/hr over 60 Minutes Intravenous Every 8 hours 01/27/19 2002     01/27/19 2015  vancomycin (VANCOCIN) IVPB 1000 mg/200 mL premix  Status:  Discontinued     1,000 mg 200 mL/hr over 60 Minutes Intravenous  Once 01/27/19 2002 01/27/19 2011   01/27/19 2015  ceFEPIme (MAXIPIME) 1 g in sodium chloride 0.9 % 100 mL IVPB     1 g 200 mL/hr over 30 Minutes Intravenous  Once 01/27/19 2007 01/27/19 2153   01/27/19 2015  vancomycin (VANCOCIN) 2,500 mg in sodium chloride 0.9 %  500 mL IVPB     2,500 mg 250 mL/hr over 120 Minutes Intravenous  Once 01/27/19 2011 01/28/19 0015      Procedures: None  CONSULTS:  nephrology  PCCM  Time spent: 25 minutes-Greater than 50% of this time was spent in counseling, explanation of diagnosis, planning of further management, and coordination of care.  MEDICATIONS: Scheduled Meds: . aspirin EC  81 mg Oral QHS  . calcium  acetate  667 mg Oral TID WC  . Chlorhexidine Gluconate Cloth  6 each Topical Q0600  . DULoxetine  60 mg Oral Daily  . famotidine  20 mg Oral QHS  . heparin  5,000 Units Subcutaneous Q8H  . insulin aspart  0-9 Units Subcutaneous TID WC  . levothyroxine  75 mcg Oral QAC breakfast  . midodrine  10 mg Oral Q M,W,F-HD  . multivitamin  1 tablet Oral QHS  . pravastatin  40 mg Oral QPM  . sevelamer carbonate  2,400 mg Oral TID WC  . sodium chloride flush  3 mL Intravenous Q12H  . sucroferric oxyhydroxide  1,000 mg Oral TID WC   Continuous Infusions: . sodium chloride    . ceFEPime (MAXIPIME) IV Stopped (01/29/19 2011)  . metronidazole 500 mg (01/30/19 0407)  . vancomycin Stopped (01/29/19 1817)   PRN Meds:.sodium chloride, acetaminophen, ondansetron **OR** ondansetron (ZOFRAN) IV, oxyCODONE, oxyCODONE-acetaminophen, sodium chloride flush   PHYSICAL EXAM: Vital signs: Vitals:   01/29/19 1233 01/29/19 1423 01/30/19 0527 01/30/19 0610  BP: (!) 95/45  (!) 114/58   Pulse: 92 90 86   Resp: 18 16 (!) 98   Temp: 98.4 F (36.9 C) 98.4 F (36.9 C) 98.5 F (36.9 C)   TempSrc: Oral Oral Oral   SpO2: 98% 100%    Weight:    (!) 195 kg   Filed Weights   01/30/19 0610  Weight: (!) 195 kg   Body mass index is 69.39 kg/m.   General appearance:Awake, alert, not in any distress.  Eyes:no scleral icterus. HEENT: Atraumatic and Normocephalic Neck: supple, no JVD. Resp:Good air entry bilaterally,no rales or rhonchi CVS: S1 S2 regular, no murmurs.  GI: Bowel sounds present, Non tender and not distended with no gaurding, rigidity or rebound. Extremities: B/L Lower Ext shows no edema, both legs are warm to touch Neurology:  Non focal Psychiatric: Normal judgment and insight. Normal mood. Musculoskeletal:No digital cyanosis Skin:No Rash, warm and dry  I have personally reviewed following labs and imaging studies  LABORATORY DATA: CBC: Recent Labs  Lab 01/27/19 2012 01/27/19 2029  01/28/19 0318 01/29/19 0352 01/30/19 0400  WBC 33.0*  --  37.9* 30.6* 24.8*  NEUTROABS 31.1*  --   --   --   --   HGB 10.2* 11.9* 9.1* 10.1* 8.6*  HCT 37.9 35.0* 33.4* 36.5 31.9*  MCV 104.4*  --  103.7* 103.4* 102.6*  PLT 340  --  297 276 341    Basic Metabolic Panel: Recent Labs  Lab 01/27/19 2012 01/27/19 2029 01/28/19 0318 01/29/19 0352  NA 131* 132* 131* 133*  K 4.2 4.3 4.7 4.6  CL 95*  --  98 99  CO2 23  --  23 22  GLUCOSE 281*  --  199* 119*  BUN 40*  --  44* 61*  CREATININE 7.36*  --  7.63* 9.40*  CALCIUM 9.0  --  8.7* 8.9    GFR: Estimated Creatinine Clearance: 11.8 mL/min (A) (by C-G formula based on SCr of 9.4 mg/dL (H)).  Liver Function Tests: Recent  Labs  Lab 01/27/19 2012 01/29/19 0352  AST 21 19  ALT 18 14  ALKPHOS 123 122  BILITOT 0.4 0.7  PROT 9.3* 8.6*  ALBUMIN 2.8* 2.4*   Recent Labs  Lab 01/29/19 0352  LIPASE 25   No results for input(s): AMMONIA in the last 168 hours.  Coagulation Profile: No results for input(s): INR, PROTIME in the last 168 hours.  Cardiac Enzymes: Recent Labs  Lab 01/28/19 0214  TROPONINI 0.05*    BNP (last 3 results) No results for input(s): PROBNP in the last 8760 hours.  HbA1C: No results for input(s): HGBA1C in the last 72 hours.  CBG: Recent Labs  Lab 01/28/19 2316 01/29/19 0451 01/29/19 1553 01/29/19 2028 01/30/19 0756  GLUCAP 144* 112* 195* 163* 132*    Lipid Profile: No results for input(s): CHOL, HDL, LDLCALC, TRIG, CHOLHDL, LDLDIRECT in the last 72 hours.  Thyroid Function Tests: No results for input(s): TSH, T4TOTAL, FREET4, T3FREE, THYROIDAB in the last 72 hours.  Anemia Panel: No results for input(s): VITAMINB12, FOLATE, FERRITIN, TIBC, IRON, RETICCTPCT in the last 72 hours.  Urine analysis:    Component Value Date/Time   COLORURINE YELLOW 07/18/2013 0831   APPEARANCEUR CLOUDY (A) 07/18/2013 0831   LABSPEC 1.012 07/18/2013 0831   PHURINE 5.0 07/18/2013 0831   GLUCOSEU  NEGATIVE 07/18/2013 0831   HGBUR SMALL (A) 07/18/2013 0831   BILIRUBINUR NEGATIVE 07/18/2013 0831   KETONESUR NEGATIVE 07/18/2013 0831   PROTEINUR 30 (A) 07/18/2013 0831   UROBILINOGEN 0.2 07/18/2013 0831   NITRITE NEGATIVE 07/18/2013 0831   LEUKOCYTESUR MODERATE (A) 07/18/2013 0831    Sepsis Labs: Lactic Acid, Venous    Component Value Date/Time   LATICACIDVEN 2.6 ([place]) 01/28/2019 0318    MICROBIOLOGY: Recent Results (from the past 240 hour(s))  Blood Culture (routine x 2)     Status: None (Preliminary result)   Collection Time: 01/27/19  8:12 PM  Result Value Ref Range Status   Specimen Description BLOOD RIGHT ANTECUBITAL  Final   Special Requests   Final    BOTTLES DRAWN AEROBIC AND ANAEROBIC Blood Culture adequate volume   Culture   Final    NO GROWTH 2 DAYS Performed at [facility], [place] [address] [place], [place] [address]    Report Status PENDING  Incomplete  Blood Culture (routine x 2)     Status: None (Preliminary result)   Collection Time: 01/27/19  8:12 PM  Result Value Ref Range Status   Specimen Description BLOOD RIGHT WRIST  Final   Special Requests   Final    BOTTLES DRAWN AEROBIC AND ANAEROBIC Blood Culture results may not be optimal due to an inadequate volume of blood received in culture bottles   Culture   Final    NO GROWTH 2 DAYS Performed at [facility], [place] [address], [place], [place] [address]    Report Status PENDING  Incomplete    RADIOLOGY STUDIES/RESULTS: Dg Chest Port 1 View  Result Date: 01/27/2019 CLINICAL DATA:  Fever EXAM: PORTABLE CHEST 1 VIEW COMPARISON:  09/18/2018 FINDINGS: Right-sided central venous catheter tip over the proximal right atrium. Cardiomegaly with vascular congestion. Hazy asymmetric left greater than right opacity. No pneumothorax. Streaky atelectasis at the right base. IMPRESSION: Rotated patient. Enlarged cardiomediastinal silhouette with vascular congestion and hazy left greater than right asymmetric  opacity which may reflect edema. Streaky atelectasis at the right base. Electronically Signed   By: Donavan Foil M.D.   On: 01/27/2019 21:16  Dg Chest Port 1v Same Day  Result Date: 01/29/2019 CLINICAL DATA:  Shortness of breath EXAM: PORTABLE CHEST 1 VIEW COMPARISON:  01/27/2019, 09/18/2018, 09/17/2018 FINDINGS: Right-sided central venous catheter tip over the SVC. Rotated patient. Cardiomegaly with vascular congestion and pulmonary edema. Elevation of the right diaphragm. Probable basilar atelectasis. IMPRESSION: 1. Cardiomegaly with vascular congestion and pulmonary edema. Hazy opacity in the left thorax is asymmetric and is in part due to rotation. 2. Atelectasis at the right base Electronically Signed   By: Donavan Foil M.D.   On: 01/29/2019 19:35   Vas US Duplex Dialysis Access (avf, Avg)  Result Date: 01/23/2019 DIALYSIS ACCESS Reason for Exam: Unable to dialyze through AVF/AVG. Access Site: Left Upper Extremity. Access Type: Brachial artery to axillary vein AVG. History: Placed 10/10/18 afater failed attempt at thrombectomy of AVF. Performing Technologist: Ralene Cork RVT  Examination Guidelines: A complete evaluation includes B-mode imaging, spectral Doppler, color Doppler, and power Doppler as needed of all accessible portions of each vessel. Unilateral testing is considered an integral part of a complete examination. Limited examinations for reoccurring indications may be performed as noted.  Findings:   +--------------------+----------+-----------------+----------------------------+ AVG                 PSV (cm/s)Flow Vol (mL/min)          Describe           +--------------------+----------+-----------------+----------------------------+ Native artery inflow   122                       waveform consistent with                                                        graft occlusion        +--------------------+----------+-----------------+----------------------------+ Arterial  anastomosis                                     occluded           +--------------------+----------+-----------------+----------------------------+ Prox graft                                               occluded           +--------------------+----------+-----------------+----------------------------+ Mid graft                                                occluded           +--------------------+----------+-----------------+----------------------------+ Distal graft                                             occluded           +--------------------+----------+-----------------+----------------------------+ Venous anastomosis  occluded           +--------------------+----------+-----------------+----------------------------+ Venous outflow                                           occluded           +--------------------+----------+-----------------+----------------------------+  Summary: Occluded left brachial artery to axillary vein AVG. The outflow vein appears thrombosed at he anastomosis, but appears patent in the proximal axilla. Technically limted by body habitus.  *See table(s) above for measurements and observations.  Diagnosing physician: Ruta Hinds MD Electronically signed by Ruta Hinds MD on 01/23/2019 at 2:20:07 PM.   --------------------------------------------------------------------------------   Final      LOS: 2 days   Oren Binet, MD  [facility]  If 7PM-7AM, please contact night-coverage  Please page via [email or web address]  Go to [email or web address] and use [facility]'s universal password to access. If you do not have the password, please contact the hospital operator.  Locate the [facility] provider you are looking for under [facility] and page to a number that you can be directly reached. If you still have difficulty reaching the provider, please page the [facility] (Director on Call) for the Hospitalists listed  on amion for assistance.  01/30/2019, 11:41 AM

## 2019-01-30 NOTE — Plan of Care (Signed)
Discussed with patient plan of care for the evening, pain management and going to dialysis tonight instead of tomorrow morning with some teach back displayed.

## 2019-01-30 NOTE — Evaluation (Signed)
Occupational Therapy Evaluation Patient Details Name: [name] MRN: [id number] DOB: [birth year] Today's Date: 01/30/2019    History of Present Illness (P) Pt is a 58 y/o female admitted secondary to increased lethargy and respiratory failure. Required Bipap upon admission. Found to have Severe sepsis due to hydradenitis v/s HCAP. PMH includes DM2, ESRD on MWF dialysis, OSA intolerant to CPAP, morbid obesity, PAF.    Clinical Impression   Pt with decline in function and and safety with ADLs and ADL mobility with decreased strength, balance and endurance. Pt limited by fatigue. PTA, pt lived at home with her husband and at baseline required assist form family with bathing, LB ADLs, toileting. Pt would benefit from acute OT services to address impairments to maximize level of function and safety    Follow Up Recommendations  No follow up OT(supervision)    Equipment Recommendations  None recommended by OT    Recommendations for Other Services       Precautions / Restrictions Precautions Precautions: (P) Fall Restrictions Weight Bearing Restrictions: (P) No      Mobility Bed Mobility               General bed mobility comments: pt in recliner upon arrival  Transfers Overall transfer level: Needs assistance Equipment used: 1 person hand held assist Transfers: Sit to/from [facility] Sit to Stand: Min assist Stand pivot transfers: Min guard       General transfer comment: fatigues easily    Balance Overall balance assessment: Needs assistance Sitting-balance support: Bilateral upper extremity supported;Feet supported Sitting balance-Leahy Scale: Fair     Standing balance support: Bilateral upper extremity supported;During functional activity Standing balance-Leahy Scale: Poor                             ADL either performed or assessed with clinical judgement   ADL Overall ADL's : Needs assistance/impaired     Grooming:  Wash/dry hands;Wash/dry face;Set up;Sitting   Upper Body Bathing: Set up;Supervision/ safety;Sitting Upper Body Bathing Details (indicate cue type and reason): simulated, fatigues easily Lower Body Bathing: Total assistance   Upper Body Dressing : Supervision/safety;Set up;Sitting Upper Body Dressing Details (indicate cue type and reason):  fatigues easily Lower Body Dressing: Total assistance   Toilet Transfer: Minimal assistance;Ambulation;Stand-pivot   Toileting- Clothing Manipulation and Hygiene: Total assistance       Functional mobility during ADLs: Minimal assistance;Min guard General ADL Comments: at baseline, pt receives assist from family for bathing, toileting and LB dressing. Pt able to currently perform UB ADLs and grooming, however fatigues easily     Vision Patient Visual Report: No change from baseline       Perception     Praxis      Pertinent Vitals/Pain Pain Assessment: (P) Faces Faces Pain Scale: Hurts little more Pain Location: bottom, B hands Pain Descriptors / Indicators: Sore;Numbness Pain Intervention(s): Limited activity within patient's tolerance;Monitored during session;Premedicated before session;Repositioned     Hand Dominance Right   Extremity/Trunk Assessment Upper Extremity Assessment Upper Extremity Assessment: Generalized weakness   Lower Extremity Assessment Lower Extremity Assessment: Defer to PT evaluation   Cervical / Trunk Assessment Cervical / Trunk Assessment: Normal   Communication Communication Communication: HOH   Cognition Arousal/Alertness: Awake/alert Behavior During Therapy: WFL for tasks assessed/performed Overall Cognitive Status: Within Functional Limits for tasks assessed  General Comments       Exercises     Shoulder Instructions      Home Living Family/patient expects to be discharged to:: Private residence Living Arrangements: Spouse/significant  other Available Help at Discharge: Family Type of Home: House Home Access: Level entry     Home Layout: Two level Alternate Level Stairs-Number of Steps: 16 Alternate Level Stairs-Rails: Right;Left;Can reach both Bathroom Shower/Tub: Tub/shower unit(does not use)   Bathroom Toilet: Standard     Home Equipment: Cane - single point   Additional Comments: Reports she has a walker      Prior Functioning/Environment Level of Independence: Needs assistance  Gait / Transfers Assistance Needed: Was independent with mobility tasks.  ADL's / Homemaking Assistance Needed: Required assist from husband and mother for bathing and LB dressing. Performed sponge baths. Reports she did not get in the shower.             OT Problem List: Decreased strength;Decreased activity tolerance;Impaired balance (sitting and/or standing);Obesity;Pain      OT Treatment/Interventions: Self-care/ADL training;Therapeutic exercise;Therapeutic activities;Patient/family education;DME and/or AE instruction    OT Goals(Current goals can be found in the care plan section) Acute Rehab OT Goals Patient Stated Goal: to go home OT Goal Formulation: With patient/family Time For Goal Achievement: 02/13/19 Potential to Achieve Goals: Good ADL Goals Pt Will Perform Grooming: with set-up;with modified independence;Independently;sitting;with caregiver independent in assisting Pt Will Perform Upper Body Bathing: with set-up;with modified independence;Independently;with caregiver independent in assisting Pt Will Perform Upper Body Dressing: with set-up;with modified independence;Independently;with adaptive equipment Pt Will Transfer to Toilet: with min guard assist;with supervision;ambulating  OT Frequency: Min 2X/week   Barriers to D/C:    no barriers       Co-evaluation              AM-PAC OT "6 Clicks" Daily Activity     Outcome Measure Help from another person eating meals?: None Help from another  person taking care of personal grooming?: None Help from another person toileting, which includes using toliet, bedpan, or urinal?: Total Help from another person bathing (including washing, rinsing, drying)?: A Lot Help from another person to put on and taking off regular upper body clothing?: None Help from another person to put on and taking off regular lower body clothing?: Total 6 Click Score: 16   End of Session    Activity Tolerance: Patient limited by fatigue Patient left: in chair;with call bell/phone within reach;with family/visitor present;with nursing/sitter in room  OT Visit Diagnosis: Unsteadiness on feet (R26.81);Other abnormalities of gait and mobility (R26.89);Muscle weakness (generalized) (M62.81);Pain Pain - part of body: (bottom)                Time: 9563-8756 OT Time Calculation (min): 19 min Charges:  OT General Charges $OT Visit: 1 Visit OT Evaluation $OT Eval Moderate Complexity: 1 Mod    Britt Bottom 01/30/2019, 1:52 PM

## 2019-01-30 NOTE — Progress Notes (Signed)
Physical Therapy Evaluation Patient Details Name: [name] MRN: [id number] DOB: [birth year] Today's Date: 01/30/2019   History of Present Illness  Pt is a 58 y/o female admitted secondary to increased lethargy and respiratory failure. Required Bipap upon admission. Found to have Severe sepsis due to hydradenitis v/s HCAP. PMH includes DM2, ESRD on MWF dialysis, OSA intolerant to CPAP, morbid obesity, PAF.   Clinical Impression  Making improvements with general mobility.  Worked with pt and ?husband on basic handling and technique to get pt helping more efficiently.  Pt may need oxygen during the day for a further period of time.     Follow Up Recommendations Home health PT;Supervision/Assistance - 24 hour    Equipment Recommendations  Rolling walker with 5" wheels(bariatric  RW)    Recommendations for Other Services       Precautions / Restrictions Precautions Precautions: Fall Restrictions Weight Bearing Restrictions: No      Mobility  Bed Mobility Overal bed mobility: Needs Assistance Bed Mobility: Sidelying to Sit   Sidelying to sit: Mod assist       General bed mobility comments: cues for coming up via R elbow and support to give pt time to push up with right UE  Transfers Overall transfer level: Needs assistance Equipment used: 1 person hand held assist;Rolling walker (2 wheeled) Transfers: Sit to/from Stand Sit to Stand: Min assist Stand pivot transfers: Min assist       General transfer comment: cues for hand placement,  assist to help come forward.  Ambulation/Gait Ambulation/Gait assistance: Min assist Gait Distance (Feet): 25 Feet(then 25 feet  with rests in between) Assistive device: Rolling walker (2 wheeled) Gait Pattern/deviations: Step-through pattern Gait velocity: slower Gait velocity interpretation: <1.31 ft/sec, indicative of household ambulator General Gait Details: cues for posture and direction.  Minor stability assist.  Stairs             Wheelchair Mobility    Modified Rankin (Stroke Patients Only)       Balance Overall balance assessment: Needs assistance Sitting-balance support: Bilateral upper extremity supported;Feet supported Sitting balance-Leahy Scale: Fair     Standing balance support: Bilateral upper extremity supported;During functional activity Standing balance-Leahy Scale: Poor Standing balance comment: Reliant on BUE and external support to maintain sitting balance.                              Pertinent Vitals/Pain Pain Assessment: Faces Faces Pain Scale: Hurts little more Pain Location: bottom Pain Descriptors / Indicators: Sore;Numbness Pain Intervention(s): Monitored during session    Home Living Family/patient expects to be discharged to:: Private residence Living Arrangements: Spouse/significant other Available Help at Discharge: Family Type of Home: House Home Access: Level entry     Home Layout: Two level Home Equipment: Cane - single point Additional Comments: Reports she has a walker    Prior Function Level of Independence: Needs assistance   Gait / Transfers Assistance Needed: Was independent with mobility tasks.   ADL's / Homemaking Assistance Needed: Required assist from husband and mother for bathing and LB dressing. Performed sponge baths. Reports she did not get in the shower.         Hand Dominance   Dominant Hand: Right    Extremity/Trunk Assessment   Upper Extremity Assessment Upper Extremity Assessment: Generalized weakness    Lower Extremity Assessment Lower Extremity Assessment: Defer to PT evaluation    Cervical / Trunk Assessment Cervical / Trunk Assessment: Normal  Communication   Communication: HOH  Cognition Arousal/Alertness: Awake/alert Behavior During Therapy: WFL for tasks assessed/performed Overall Cognitive Status: Within Functional Limits for tasks assessed                                         General Comments General comments (skin integrity, edema, etc.): sats maintained for a short period on RA in the low 90's, but oxygen reapplied to continue progressing ambulation.    Exercises     Assessment/Plan    PT Assessment    PT Problem List         PT Treatment Interventions      PT Goals (Current goals can be found in the Care Plan section)  Acute Rehab PT Goals Patient Stated Goal: to go home PT Goal Formulation: With patient/family Time For Goal Achievement: 02/11/19 Potential to Achieve Goals: Good    Frequency Min 3X/week   Barriers to discharge        Co-evaluation               AM-PAC PT "6 Clicks" Mobility  Outcome Measure Help needed turning from your back to your side while in a flat bed without using bedrails?: A Little Help needed moving from lying on your back to sitting on the side of a flat bed without using bedrails?: A Lot Help needed moving to and from a bed to a chair (including a wheelchair)?: A Little Help needed standing up from a chair using your arms (e.g., wheelchair or bedside chair)?: A Little Help needed to walk in hospital room?: A Little Help needed climbing 3-5 steps with a railing? : A Lot 6 Click Score: 16    End of Session   Activity Tolerance: Patient limited by fatigue Patient left: in chair;with call bell/phone within reach;with chair alarm set;with nursing/sitter in room;with family/visitor present Nurse Communication: Mobility status PT Visit Diagnosis: Other abnormalities of gait and mobility (R26.89);Unsteadiness on feet (R26.81);Muscle weakness (generalized) (M62.81)    Time: 7903-8333 PT Time Calculation (min) (ACUTE ONLY): 36 min   Charges:     PT Treatments $Gait Training: 8-22 mins $Therapeutic Activity: 8-22 mins        01/30/2019  Donnella Sham, PT Acute Rehabilitation Services [phone]  (pager) [phone]  (office)  Tessie Fass Ahlijah Raia 01/30/2019, 2:02 PM

## 2019-01-30 NOTE — Progress Notes (Addendum)
[facility] Progress Note   Subjective:   Patient seen and examined at bedside.  States she is feeling much better.  Denies CP, SOB, n/v, abdominal pain, weakness and edema.  Reports she may be discharged tomorrow and would like to receive dialysis at OP unit if she is.   Objective Vitals:   01/29/19 1233 01/29/19 1423 01/30/19 0527 01/30/19 0610  BP: (!) 95/45  (!) 114/58   Pulse: 92 90 86   Resp: 18 16 (!) 98   Temp: 98.4 F (36.9 C) 98.4 F (36.9 C) 98.5 F (36.9 C)   TempSrc: Oral Oral Oral   SpO2: 98% 100%    Weight:    (!) 195 kg   Physical Exam General:NAD, obese female, laying on R side in bed Heart:RRR, no mrg appreciated Lungs:mostly CTAB, BS diminished  Abdomen:soft, obese, NT Extremities:no LE edema Dialysis Access: R IJ [facility] - dressed   Filed Weights   01/30/19 0610  Weight: (!) 195 kg    Intake/Output Summary (Last 24 hours) at 01/30/2019 1155 Last data filed at 01/30/2019 0850 Gross per 24 hour  Intake 1427.14 ml  Output 1055 ml  Net 372.14 ml    Additional Objective Labs: Basic Metabolic Panel: Recent Labs  Lab 01/27/19 2012 01/27/19 2029 01/28/19 0318 01/29/19 0352  NA 131* 132* 131* 133*  K 4.2 4.3 4.7 4.6  CL 95*  --  98 99  CO2 23  --  23 22  GLUCOSE 281*  --  199* 119*  BUN 40*  --  44* 61*  CREATININE 7.36*  --  7.63* 9.40*  CALCIUM 9.0  --  8.7* 8.9   Liver Function Tests: Recent Labs  Lab 01/27/19 2012 01/29/19 0352  AST 21 19  ALT 18 14  ALKPHOS 123 122  BILITOT 0.4 0.7  PROT 9.3* 8.6*  ALBUMIN 2.8* 2.4*   Recent Labs  Lab 01/29/19 0352  LIPASE 25   CBC: Recent Labs  Lab 01/27/19 2012  01/28/19 0318 01/29/19 0352 01/30/19 0400  WBC 33.0*  --  37.9* 30.6* 24.8*  NEUTROABS 31.1*  --   --   --   --   HGB 10.2*   < > 9.1* 10.1* 8.6*  HCT 37.9   < > 33.4* 36.5 31.9*  MCV 104.4*  --  103.7* 103.4* 102.6*  PLT 340  --  297 276 267   < > = values in this interval not displayed.   Blood Culture     Component Value Date/Time   SDES BLOOD RIGHT ANTECUBITAL 01/27/2019 2012   SDES BLOOD RIGHT WRIST 01/27/2019 2012   SPECREQUEST  01/27/2019 2012    BOTTLES DRAWN AEROBIC AND ANAEROBIC Blood Culture adequate volume   SPECREQUEST  01/27/2019 2012    BOTTLES DRAWN AEROBIC AND ANAEROBIC Blood Culture results may not be optimal due to an inadequate volume of blood received in culture bottles   CULT  01/27/2019 2012    NO GROWTH 2 DAYS Performed at [facility], [place] [address] [place], [place] [address]    CULT  01/27/2019 2012    NO GROWTH 2 DAYS Performed at [place] [address], [place],  [address]    REPTSTATUS PENDING 01/27/2019 2012   REPTSTATUS PENDING 01/27/2019 2012    Cardiac Enzymes: Recent Labs  Lab 01/28/19 0214  TROPONINI 0.05*   CBG: Recent Labs  Lab 01/28/19 2316 01/29/19 0451 01/29/19 1553 01/29/19 2028 01/30/19 0756  GLUCAP 144* 112* 195*  163* 132*   Studies/Results: Dg Chest Port 1v Same Day  Result Date: 01/29/2019 CLINICAL DATA:  Shortness of breath EXAM: PORTABLE CHEST 1 VIEW COMPARISON:  01/27/2019, 09/18/2018, 09/17/2018 FINDINGS: Right-sided central venous catheter tip over the SVC. Rotated patient. Cardiomegaly with vascular congestion and pulmonary edema. Elevation of the right diaphragm. Probable basilar atelectasis. IMPRESSION: 1. Cardiomegaly with vascular congestion and pulmonary edema. Hazy opacity in the left thorax is asymmetric and is in part due to rotation. 2. Atelectasis at the right base Electronically Signed   By: Donavan Foil M.D.   On: 01/29/2019 19:35    Medications: . sodium chloride    . ceFEPime (MAXIPIME) IV Stopped (01/29/19 2011)  . metronidazole 500 mg (01/30/19 0407)  . vancomycin Stopped (01/29/19 1817)   . aspirin EC  81 mg Oral QHS  . calcium acetate  667 mg Oral TID WC  . Chlorhexidine Gluconate Cloth  6 each Topical Q0600  . DULoxetine  60 mg Oral Daily  . famotidine  20 mg Oral QHS  .  heparin  5,000 Units Subcutaneous Q8H  . insulin aspart  0-9 Units Subcutaneous TID WC  . levothyroxine  75 mcg Oral QAC breakfast  . midodrine  10 mg Oral Q M,W,F-HD  . multivitamin  1 tablet Oral QHS  . pravastatin  40 mg Oral QPM  . sevelamer carbonate  2,400 mg Oral TID WC  . sodium chloride flush  3 mL Intravenous Q12H  . sucroferric oxyhydroxide  1,000 mg Oral TID WC    Dialysis Orders:  MWF [facility]  4h 2min   154.5kg   500/800  F200  2/2.25 bath   Hep 5000  - calcitriol 3 ug tiw  - mircera 75 ug every 2 wks  Assessment/Plan: 1. Fevers/leukocytosis - c/w HCAP +/- hidradenitis: CXR showed asymmetric infiltrates, on IV ABX per primary - consulted ID for ABX at d/c. 2. Acute on chronic hypoxic respiratory failure (on home O2, 2L mostly at night): likely d/t PNA & pulmonary edema. 3. ESRD - on HD MWF. K 4.6.  HD yesterday tolerated well with net UF 1L. Plan for extra HD today for excess volume and then tomorrow per regular schedule. If plan is to d/c tomorrow can go to regularly scheduled OP dialysis, discussed with their charge nurse, will need to be there by 11am. 4. Anemia of CKD- Hgb 10.1>8.6, ESA dosed last week. Holding Fe due to infection, follow trends.   5. Secondary hyperparathyroidism - Ca at goal.  Need to check phos. Continue VDRA, binders. 6. Hypotension/volume - on midodrine. CXR shows pulmonary edema - plan for HD with net UF goal 3-4L as tolerated.  7. Nutrition - Renal diet w/fluid restrictions.  8. Hidradenitis - chronic, per primary 9. PAF  10. DMT2 11. Hypothyroidism  Jen Mow, PA-C [facility] Pager: [phone] 01/30/2019,11:55 AM  LOS: 2 days   Pt seen, examined and agree w A/P as above. F/U CXR suggesting pulm edema, will do extra HD today for volume removal as tolerated, allow hypotension if tolerated during HD given similar drops at OP unit  [facility] 01/30/2019, 2:41 PM

## 2019-01-31 LAB — GLUCOSE, CAPILLARY
GLUCOSE-CAPILLARY: 172 mg/dL — AB (ref 70–99)
Glucose-Capillary: 125 mg/dL — ABNORMAL HIGH (ref 70–99)
Glucose-Capillary: 140 mg/dL — ABNORMAL HIGH (ref 70–99)

## 2019-01-31 LAB — CBC
HCT: 35.5 % — ABNORMAL LOW (ref 36.0–46.0)
HEMOGLOBIN: 10.1 g/dL — AB (ref 12.0–15.0)
MCH: 28.8 pg (ref 26.0–34.0)
MCHC: 28.5 g/dL — ABNORMAL LOW (ref 30.0–36.0)
MCV: 101.1 fL — ABNORMAL HIGH (ref 80.0–100.0)
Platelets: 351 10*3/uL (ref 150–400)
RBC: 3.51 MIL/uL — ABNORMAL LOW (ref 3.87–5.11)
RDW: 18 % — ABNORMAL HIGH (ref 11.5–15.5)
WBC: 20.7 10*3/uL — ABNORMAL HIGH (ref 4.0–10.5)
nRBC: 0.2 % (ref 0.0–0.2)

## 2019-01-31 MED ORDER — METRONIDAZOLE IN NACL 5-0.79 MG/ML-% IV SOLN: 500.0000 mg | Freq: Three times a day (TID) | INTRAVENOUS | Status: DC

## 2019-01-31 MED ORDER — SODIUM CHLORIDE 0.9 % IV SOLN
2.0000 g | Freq: Once | INTRAVENOUS | Status: AC
  Administered 2019-01-31: 2 g via INTRAVENOUS
  Filled 2019-01-31: qty 2

## 2019-01-31 MED ORDER — SODIUM CHLORIDE 0.9 % IV SOLN: 2.0000 g | INTRAVENOUS | Status: AC

## 2019-01-31 MED ORDER — METRONIDAZOLE 500 MG PO TABS: 500.0000 mg | ORAL_TABLET | Freq: Three times a day (TID) | ORAL | 0 refills | Status: DC

## 2019-01-31 MED ORDER — METRONIDAZOLE 500 MG PO TABS
500.0000 mg | ORAL_TABLET | Freq: Three times a day (TID) | ORAL | Status: DC
  Administered 2019-01-31: 500 mg via ORAL
  Filled 2019-01-31: qty 1

## 2019-01-31 MED ORDER — SODIUM CHLORIDE 0.9 % IV SOLN
2.0000 g | INTRAVENOUS | Status: DC
  Filled 2019-01-31: qty 2

## 2019-01-31 MED ORDER — SODIUM CHLORIDE 0.9 % IV SOLN: 2.0000 g | INTRAVENOUS | Status: DC

## 2019-01-31 MED ORDER — SODIUM CHLORIDE 0.9 % IV SOLN: 1.0000 g | INTRAVENOUS | Status: DC

## 2019-01-31 NOTE — Progress Notes (Signed)
Patient and mother given all discharge instructions, and flagyl prescription. All questions answered. Assisted mother in dressing patient, and PIV x 1 removed. Patient discharged home with mother all belongings packed for patient.

## 2019-01-31 NOTE — Progress Notes (Addendum)
[facility] Progress Note   Subjective:   Seen and examined at bedside.  Reports being tired and weak today.  Denies CP, SOB, n/v/d, and edema.  Dialysis overnight went well.  Refusing to have HD again today since it she finished her extra treatment at 11pm.  Agrees to go tomorrow if appointment time available.   Objective Vitals:   01/30/19 2300 01/30/19 2351 01/31/19 0104 01/31/19 0336  BP: 104/60 (!) 111/91 115/80 117/68  Pulse: 96 70 (!) 107 88  Resp:  18  19  Temp:  97.9 F (36.6 C) 98.5 F (36.9 C) 98.7 F (37.1 C)  TempSrc:  Oral Oral Oral  SpO2:  94% 96% 100%  Weight:      Height:       Physical Exam General:NAD, obese, female laying on R side in bed Heart:RRR, no mrg Lungs:mostly CTAB, BS decreased b/l Abdomen:soft, NTND Extremities:no LE edema Dialysis Access: R IJ [facility]   Filed Weights   01/30/19 0610  Weight: (!) 195 kg    Intake/Output Summary (Last 24 hours) at 01/31/2019 1022 Last data filed at 01/31/2019 0300 Gross per 24 hour  Intake 322.42 ml  Output 2500 ml  Net -2177.58 ml    Additional Objective Labs: Basic Metabolic Panel: Recent Labs  Lab 01/28/19 0318 01/29/19 0352 01/30/19 2025  NA 131* 133* 134*  K 4.7 4.6 3.7  CL 98 99 98  CO2 23 22 27   GLUCOSE 199* 119* 166*  BUN 44* 61* 43*  CREATININE 7.63* 9.40* 7.25*  CALCIUM 8.7* 8.9 8.7*  PHOS  --   --  2.9   Liver Function Tests: Recent Labs  Lab 01/27/19 2012 01/29/19 0352 01/30/19 2025  AST 21 19  --   ALT 18 14  --   ALKPHOS 123 122  --   BILITOT 0.4 0.7  --   PROT 9.3* 8.6*  --   ALBUMIN 2.8* 2.4* 2.1*   Recent Labs  Lab 01/29/19 0352  LIPASE 25   CBC: Recent Labs  Lab 01/27/19 2012  01/28/19 0318 01/29/19 0352 01/30/19 0400 01/30/19 2022 01/31/19 0753  WBC 33.0*  --  37.9* 30.6* 24.8* 20.6* 20.7*  NEUTROABS 31.1*  --   --   --   --   --   --   HGB 10.2*   < > 9.1* 10.1* 8.6* 8.5* 10.1*  HCT 37.9   < > 33.4* 36.5 31.9* 31.3* 35.5*  MCV 104.4*   --  103.7* 103.4* 102.6* 103.6* 101.1*  PLT 340  --  297 276 267 264 351   < > = values in this interval not displayed.    Cardiac Enzymes: Recent Labs  Lab 01/28/19 0214  TROPONINI 0.05*   CBG: Recent Labs  Lab 01/30/19 0756 01/30/19 1221 01/30/19 1705 01/31/19 0104 01/31/19 0730  GLUCAP 132* 145* 187* 125* 140*   Studies/Results: Dg Chest Port 1v Same Day  Result Date: 01/29/2019 CLINICAL DATA:  Shortness of breath EXAM: PORTABLE CHEST 1 VIEW COMPARISON:  01/27/2019, 09/18/2018, 09/17/2018 FINDINGS: Right-sided central venous catheter tip over the SVC. Rotated patient. Cardiomegaly with vascular congestion and pulmonary edema. Elevation of the right diaphragm. Probable basilar atelectasis. IMPRESSION: 1. Cardiomegaly with vascular congestion and pulmonary edema. Hazy opacity in the left thorax is asymmetric and is in part due to rotation. 2. Atelectasis at the right base Electronically Signed   By: Donavan Foil M.D.   On: 01/29/2019 19:35    Medications: . sodium chloride    .  ceFEPime (MAXIPIME) IV    . [START ON 02/03/2019] ceFEPime (MAXIPIME) IV     . aspirin EC  81 mg Oral QHS  . calcium acetate  667 mg Oral TID WC  . Chlorhexidine Gluconate Cloth  6 each Topical Q0600  . DULoxetine  60 mg Oral Daily  . famotidine  20 mg Oral QHS  . heparin  5,000 Units Subcutaneous Q8H  . insulin aspart  0-9 Units Subcutaneous TID WC  . levothyroxine  75 mcg Oral QAC breakfast  . metroNIDAZOLE  500 mg Oral Q8H  . midodrine  10 mg Oral Q M,W,F-HD  . multivitamin  1 tablet Oral QHS  . pravastatin  40 mg Oral QPM  . sevelamer carbonate  2,400 mg Oral TID WC  . sodium chloride flush  3 mL Intravenous Q12H  . sucroferric oxyhydroxide  1,000 mg Oral TID WC    Dialysis Orders: MWF [facility] 4h 72min 154.5kg 500/800 F200 2/2.25 bath Hep 5000 - calcitriol 3 ug tiw - mircera 75 ug every 2 wks  Assessment/Plan: 1. Fevers/leukocytosis - c/w HCAP +/- hidradenitis: CXR showed  asymmetric infiltrates, on IV ABX per primary - ID recommends cefepime 2g IV qHD and PO Flagyl x 2 weeks ~ until 3/16.  2. Acute on chronic hypoxic respiratory failure (on home O2, 2L mostly at night): improved, likely d/t PNA & pulmonary edema. 3. ESRD - on HD MWF. K 3.7.  Extra HD overnight with net UF 2.5L removed. Refusing to have HD again today since finished around 11pm yesterday.  Discussed with OP dialysis unit, appt set up for dialysis 2nd shift tomorrow.  4. Anemia of CKD- Hgb 10.1, ESA dosed last week. Holding Fe due to infection, follow trends.   5. Secondary hyperparathyroidism - Ca and phos at goal. Continue meds 6. Hypotension/volume - on midodrine. CXR showed pulmonary edema, extra HD overnight for volume removal and to have regular treatment off schedule tomorrow as OP.  7. Nutrition - Renal diet w/fluid restrictions.  8. Hidradenitis - chronic, per primary 9. PAF  10. DMT2 11. Hypothyroidism  Jen Mow, PA-C [facility] Pager: [phone] 01/31/2019,10:22 AM  LOS: 3 days   Pt seen, examined and agree w A/P as above.  [facility] 01/31/2019, 11:34 AM

## 2019-01-31 NOTE — Discharge Summary (Signed)
PATIENT DETAILS Name: [name] Age: 58 y.o. Sex: female Date of Birth: 04/10/1961 MRN: [id number]. Admitting Physician: Cherene Altes, MD RFF:[id number], Caren Griffins, MD  Admit Date: 01/27/2019 Discharge date: 01/31/2019  Recommendations for Outpatient Follow-up:  1. Follow up with PCP in 1-2 weeks 2. Please obtain BMP/CBC in one week 3. Please continue IV cefepime with HD and oral Flagyl for total of 2 weeks.  Admitted From:  Home  Disposition: Home with home health services    Home Health: Yes  Equipment/Devices: None  Discharge Condition: Stable  CODE STATUS: FULL CODE  Diet recommendation:  Heart Healthy / Carb Modified   Brief Summary: See H&P, Labs, Consult and Test reports for all details in brief, Patient is a 58 y.o. female with history of ESRD on HD, and retinitis involving the groin and the buttocks, PAF, chronic diastolic heart failure, morbid obesity presented with fever, lethargy and respiratory distress-thought to have sepsis secondary to pneumonia and hydradenitis.   Brief Hospital Course: Sepsis secondary to pneumonia +/-hidradenitis: Sepsis pathophysiology has improved-leukocytosis is downtrending-all cultures negative so far.    Does appear to have some chronic leukocytosis at baseline.  Spoke with patient's primary ID-Dr. Tommy Medal on 3/5-he suggested we continue cefepime with HD, and Flagyl for total of 2 weeks.  Case was discussed with Dr. Jonnie Finner over the phone, nephrology will arrange for cefepime with HD.   Acute on chronic hypoxic respiratory failure (on home O2 2 L mostly at night): Likely secondary to pneumonia +/-pulmonary edema in the setting of missed HD-markedly better-titrated off oxygen to usual home dosing.   Acute metabolic encephalopathy: Suspect secondary to sepsis, hypoxia.  She is completely awake and alert today.  ESRD on HD MWF: Nephrology following and directing care  Hidradenitis suppurativa involving the groin/buttock:  Evaluated by wound care RN during this hospital stay-plans are to resume prior wound care services on discharge.  See above regarding plan for antimicrobial therapy-please ensure follow-up with infectious disease on discharge.  PAF: Maintaining sinus rhythm-not on any rate control agents-due to hypotension with HD.  She is not on anticoagulation due to open skin lesions in her groin/buttock from hidradenitis which caused constant bleeding and wheezing (per last outpatient cardiology note August 2019).  DM-2: CBGs stable and SSI, resume oral hypoglycemics on discharge.  Hypothyroidism:  Continue levothyroxine  Hypotension during HD: Maintained on midodrine.  Dyslipidemia: Continue statin  Morbid obesity  Procedures/Studies: None  Discharge Diagnoses:  Principal Problem:   Severe sepsis with acute organ dysfunction (HCC) Active Problems:   Morbid obesity ([place])   DM type 2 (diabetes mellitus, type 2) (HCC)   OSA (obstructive sleep apnea)   DCM (dilated cardiomyopathy) (HCC)   Pressure ulcer   ESRD (end stage renal disease) on dialysis ([place])   Persistent atrial fibrillation   Acute on chronic respiratory failure with hypoxia ([place])   Acute encephalopathy   Sepsis ([place])   Discharge Instructions:  Activity:  As tolerated with Full fall precautions use walker/cane & assistance as needed   Discharge Instructions    Diet - low sodium heart healthy   Complete by:  As directed    Diet Carb Modified   Complete by:  As directed    Discharge instructions   Complete by:  As directed    Follow with Primary MD  Harlan Stains, MD in 1 week  Follow with your hemodialysis center at the usual schedule  You will get IV antibiotics with hemodialysis  Please get a complete  blood count and chemistry panel checked by your Primary MD at your next visit, and again as instructed by your Primary MD.  Get Medicines reviewed and adjusted: Please take all your medications with you for your  next visit with your Primary MD  Laboratory/radiological data: Please request your Primary MD to go over all hospital tests and procedure/radiological results at the follow up, please ask your Primary MD to get all Hospital records sent to his/her office.  In some cases, they will be blood work, cultures and biopsy results pending at the time of your discharge. Please request that your primary care M.D. follows up on these results.  Also Note the following: If you experience worsening of your admission symptoms, develop shortness of breath, life threatening emergency, suicidal or homicidal thoughts you must seek medical attention immediately by calling 911 or calling your MD immediately  if symptoms less severe.  You must read complete instructions/literature along with all the possible adverse reactions/side effects for all the Medicines you take and that have been prescribed to you. Take any new Medicines after you have completely understood and accpet all the possible adverse reactions/side effects.   Do not drive when taking Pain medications or sleeping medications (Benzodaizepines)  Do not take more than prescribed Pain, Sleep and Anxiety Medications. It is not advisable to combine anxiety,sleep and pain medications without talking with your primary care practitioner  Special Instructions: If you have smoked or chewed Tobacco  in the last 2 yrs please stop smoking, stop any regular Alcohol  and or any Recreational drug use.  Wear Seat belts while driving.  Please note: You were cared for by a hospitalist during your hospital stay. Once you are discharged, your primary care physician will handle any further medical issues. Please note that NO REFILLS for any discharge medications will be authorized once you are discharged, as it is imperative that you return to your primary care physician (or establish a relationship with a primary care physician if you do not have one) for your post  hospital discharge needs so that they can reassess your need for medications and monitor your lab values.   Increase activity slowly   Complete by:  As directed      Allergies as of 01/31/2019      Reactions   Rosiglitazone Other (See Comments)   "Avandia" = Reaction not recalled   Amoxicillin Rash, Other (See Comments)   Tolerated Zosyn 01/2013. [place] Has patient had a PCN reaction causing immediate rash, facial/tongue/throat swelling, SOB or lightheadedness with hypotension: Yes Has patient had a PCN reaction causing severe rash involving mucus membranes or skin necrosis: No Has patient had a PCN reaction that required hospitalization: No Has patient had a PCN reaction occurring within the last 10 years: No If all of the above answers are "NO", then may proceed with Cephalosporin use.   Amoxicillin-pot Clavulanate Diarrhea      Medication List    TAKE these medications   acetaminophen 500 MG tablet Commonly known as:  TYLENOL Take 1,000 mg by mouth daily as needed for moderate pain.   aspirin EC 81 MG tablet Take 81 mg by mouth at bedtime.   Calcium Acetate 667 MG Tabs Take 667 mg by mouth See admin instructions. Take 667 mg by mouth three times a day with meals and 667 mg with each snack   ceFEPIme 2 g in sodium chloride 0.9 % 100 mL Inject 2 g into the vein every Monday, Wednesday, and  Friday with hemodialysis for 8 days. Start taking on:  February 03, 2019   diltiazem 30 MG tablet Commonly known as:  Cardizem Take 1 tablet every 4 hours AS NEEDED for rapid heart rate lasting more than 30 mins as long as top blood pressure >100. What changed:    how much to take  how to take this  when to take this  reasons to take this  additional instructions   diphenhydrAMINE 25 MG tablet Commonly known as:  BENADRYL Take 25 mg by mouth daily as needed for allergies.   DULoxetine 60 MG capsule Commonly known as:  Cymbalta Take 1 capsule (60 mg total) by mouth daily.     famotidine 20 MG tablet Commonly known as:  PEPCID TAKE 1 TABLET BY MOUTH AT BEDTIME   feeding supplement Liqd Take 1 Container by mouth daily as needed (for meal supplementation).   glipiZIDE 5 MG tablet Commonly known as:  GLUCOTROL Take 0.5 tablets (2.5 mg total) by mouth daily.   levothyroxine 75 MCG tablet Commonly known as:  SYNTHROID, LEVOTHROID Take 75 mcg by mouth daily before breakfast.   metroNIDAZOLE 500 MG tablet Commonly known as:  FLAGYL Take 1 tablet (500 mg total) by mouth every 8 (eight) hours.   midodrine 10 MG tablet Commonly known as:  PROAMATINE Take 1 tablet (10 mg total) by mouth 3 (three) times daily. What changed:    when to take this  additional instructions   multivitamin with minerals Tabs tablet Take 1 tablet by mouth daily.   NEPHRO-VITE PO Take 1 tablet by mouth daily.   oxyCODONE-acetaminophen 5-325 MG tablet Commonly known as:  PERCOCET/ROXICET Take 1-2 tablets by mouth every 6 (six) hours as needed. Patient has not taken since started dialysis 08/2013 due to causes hypotension What changed:    how much to take  reasons to take this  additional instructions   pravastatin 40 MG tablet Commonly known as:  PRAVACHOL Take 40 mg by mouth every evening.   PROBIOTIC DAILY PO Take 1 capsule by mouth 2 (two) times daily.   sevelamer carbonate 800 MG tablet Commonly known as:  RENVELA Take 2,400 mg by mouth 3 (three) times daily with meals.   sodium hypochlorite 0.25 % Soln Apply 1 application topically See admin instructions. Apply to wounds as directed 2 times daily   Velphoro 500 MG chewable tablet Generic drug:  sucroferric oxyhydroxide Chew 500-1,000 mg by mouth See admin instructions. Chew 1,000 mg by mouth with each meal and 500 mg with each snack      Follow-up Information    Harlan Stains, MD. Schedule an appointment as soon as possible for a visit in 1 week(s).   Specialty:  Family Medicine Contact  information: [address] [place] [address] [phone]        Sueanne Margarita, MD. Schedule an appointment as soon as possible for a visit in 2 week(s).   Specialty:  Cardiology Contact information: [address] [place] [address] [phone]        hemodialysis center Follow up.   Why:  At your usual schedule       Tommy Medal, Lavell Islam, MD. Schedule an appointment as soon as possible for a visit in 1 week(s).   Specialty:  Infectious Diseases Contact information: [address] [place] [address] [phone]          Allergies  Allergen Reactions  . Rosiglitazone Other (See Comments)    "  Avandia" = Reaction not recalled  . Amoxicillin Rash and Other (See Comments)    Tolerated Zosyn 01/2013. [place] Has patient had a PCN reaction causing immediate rash, facial/tongue/throat swelling, SOB or lightheadedness with hypotension: Yes Has patient had a PCN reaction causing severe rash involving mucus membranes or skin necrosis: No Has patient had a PCN reaction that required hospitalization: No Has patient had a PCN reaction occurring within the last 10 years: No If all of the above answers are "NO", then may proceed with Cephalosporin use.   Marland Kitchen Amoxicillin-Pot Clavulanate Diarrhea    Consultations:   pulmonary/intensive care and nephrology   Other Procedures/Studies: Dg Chest Port 1 View  Result Date: 01/27/2019 CLINICAL DATA:  Fever EXAM: PORTABLE CHEST 1 VIEW COMPARISON:  09/18/2018 FINDINGS: Right-sided central venous catheter tip over the proximal right atrium. Cardiomegaly with vascular congestion. Hazy asymmetric left greater than right opacity. No pneumothorax. Streaky atelectasis at the right base. IMPRESSION: Rotated patient. Enlarged cardiomediastinal silhouette with vascular congestion and hazy left greater than right asymmetric opacity which may reflect edema. Streaky atelectasis at the right base.  Electronically Signed   By: Donavan Foil M.D.   On: 01/27/2019 21:16   Dg Chest Port 1v Same Day  Result Date: 01/29/2019 CLINICAL DATA:  Shortness of breath EXAM: PORTABLE CHEST 1 VIEW COMPARISON:  01/27/2019, 09/18/2018, 09/17/2018 FINDINGS: Right-sided central venous catheter tip over the SVC. Rotated patient. Cardiomegaly with vascular congestion and pulmonary edema. Elevation of the right diaphragm. Probable basilar atelectasis. IMPRESSION: 1. Cardiomegaly with vascular congestion and pulmonary edema. Hazy opacity in the left thorax is asymmetric and is in part due to rotation. 2. Atelectasis at the right base Electronically Signed   By: Donavan Foil M.D.   On: 01/29/2019 19:35   Vas US Duplex Dialysis Access (avf, Avg)  Result Date: 01/23/2019 DIALYSIS ACCESS Reason for Exam: Unable to dialyze through AVF/AVG. Access Site: Left Upper Extremity. Access Type: Brachial artery to axillary vein AVG. History: Placed 10/10/18 afater failed attempt at thrombectomy of AVF. Performing Technologist: Ralene Cork RVT  Examination Guidelines: A complete evaluation includes B-mode imaging, spectral Doppler, color Doppler, and power Doppler as needed of all accessible portions of each vessel. Unilateral testing is considered an integral part of a complete examination. Limited examinations for reoccurring indications may be performed as noted.  Findings:   +--------------------+----------+-----------------+----------------------------+ AVG                 PSV (cm/s)Flow Vol (mL/min)          Describe           +--------------------+----------+-----------------+----------------------------+ Native artery inflow   122                       waveform consistent with                                                        graft occlusion        +--------------------+----------+-----------------+----------------------------+ Arterial anastomosis                                     occluded            +--------------------+----------+-----------------+----------------------------+ Prox graft  occluded           +--------------------+----------+-----------------+----------------------------+ Mid graft                                                occluded           +--------------------+----------+-----------------+----------------------------+ Distal graft                                             occluded           +--------------------+----------+-----------------+----------------------------+ Venous anastomosis                                       occluded           +--------------------+----------+-----------------+----------------------------+ Venous outflow                                           occluded           +--------------------+----------+-----------------+----------------------------+  Summary: Occluded left brachial artery to axillary vein AVG. The outflow vein appears thrombosed at he anastomosis, but appears patent in the proximal axilla. Technically limted by body habitus.  *See table(s) above for measurements and observations.  Diagnosing physician: Ruta Hinds MD Electronically signed by Ruta Hinds MD on 01/23/2019 at 2:20:07 PM.   --------------------------------------------------------------------------------   Final      TODAY-DAY OF DISCHARGE:  Subjective:   [name] today has no headache,no chest abdominal pain,no new weakness tingling or numbness, feels much better wants to go home today.   Objective:   Blood pressure 117/68, pulse 88, temperature 98.7 F (37.1 C), temperature source Oral, resp. rate 19, height 5\' 6"  (1.676 m), weight (!) 195 kg, last menstrual period 06/13/2013, SpO2 100 %.  Intake/Output Summary (Last 24 hours) at 01/31/2019 0855 Last data filed at 01/31/2019 0300 Gross per 24 hour  Intake 322.42 ml  Output 2500 ml  Net -2177.58 ml   Filed Weights   01/30/19 0610    Weight: (!) 195 kg    Exam: Awake Alert, Oriented *3, No new F.N deficits, Normal affect [place].AT,PERRAL Supple Neck,No JVD, No cervical lymphadenopathy appriciated.  Symmetrical Chest wall movement, Good air movement bilaterally, CTAB RRR,No Gallops,Rubs or new Murmurs, No Parasternal Heave +ve B.Sounds, Abd Soft, Non tender, No organomegaly appriciated, No rebound -guarding or rigidity. No Cyanosis, Clubbing or edema, No new Rash or bruise   PERTINENT RADIOLOGIC STUDIES: Dg Chest Port 1 View  Result Date: 01/27/2019 CLINICAL DATA:  Fever EXAM: PORTABLE CHEST 1 VIEW COMPARISON:  09/18/2018 FINDINGS: Right-sided central venous catheter tip over the proximal right atrium. Cardiomegaly with vascular congestion. Hazy asymmetric left greater than right opacity. No pneumothorax. Streaky atelectasis at the right base. IMPRESSION: Rotated patient. Enlarged cardiomediastinal silhouette with vascular congestion and hazy left greater than right asymmetric opacity which may reflect edema. Streaky atelectasis at the right base. Electronically Signed   By: Donavan Foil M.D.   On: 01/27/2019 21:16   Dg Chest Port 1v Same Day  Result Date: 01/29/2019 CLINICAL DATA:  Shortness of breath EXAM: PORTABLE CHEST 1  VIEW COMPARISON:  01/27/2019, 09/18/2018, 09/17/2018 FINDINGS: Right-sided central venous catheter tip over the SVC. Rotated patient. Cardiomegaly with vascular congestion and pulmonary edema. Elevation of the right diaphragm. Probable basilar atelectasis. IMPRESSION: 1. Cardiomegaly with vascular congestion and pulmonary edema. Hazy opacity in the left thorax is asymmetric and is in part due to rotation. 2. Atelectasis at the right base Electronically Signed   By: Donavan Foil M.D.   On: 01/29/2019 19:35   Vas US Duplex Dialysis Access (avf, Avg)  Result Date: 01/23/2019 DIALYSIS ACCESS Reason for Exam: Unable to dialyze through AVF/AVG. Access Site: Left Upper Extremity. Access Type: Brachial artery to  axillary vein AVG. History: Placed 10/10/18 afater failed attempt at thrombectomy of AVF. Performing Technologist: Ralene Cork RVT  Examination Guidelines: A complete evaluation includes B-mode imaging, spectral Doppler, color Doppler, and power Doppler as needed of all accessible portions of each vessel. Unilateral testing is considered an integral part of a complete examination. Limited examinations for reoccurring indications may be performed as noted.  Findings:   +--------------------+----------+-----------------+----------------------------+ AVG                 PSV (cm/s)Flow Vol (mL/min)          Describe           +--------------------+----------+-----------------+----------------------------+ Native artery inflow   122                       waveform consistent with                                                        graft occlusion        +--------------------+----------+-----------------+----------------------------+ Arterial anastomosis                                     occluded           +--------------------+----------+-----------------+----------------------------+ Prox graft                                               occluded           +--------------------+----------+-----------------+----------------------------+ Mid graft                                                occluded           +--------------------+----------+-----------------+----------------------------+ Distal graft                                             occluded           +--------------------+----------+-----------------+----------------------------+ Venous anastomosis                                       occluded           +--------------------+----------+-----------------+----------------------------+ Venous outflow  occluded           +--------------------+----------+-----------------+----------------------------+  Summary:  Occluded left brachial artery to axillary vein AVG. The outflow vein appears thrombosed at he anastomosis, but appears patent in the proximal axilla. Technically limted by body habitus.  *See table(s) above for measurements and observations.  Diagnosing physician: Ruta Hinds MD Electronically signed by Ruta Hinds MD on 01/23/2019 at 2:20:07 PM.   --------------------------------------------------------------------------------   Final      PERTINENT LAB RESULTS: CBC: Recent Labs    01/30/19 2022 01/31/19 0753  WBC 20.6* 20.7*  HGB 8.5* 10.1*  HCT 31.3* 35.5*  PLT 264 351   CMET CMP     Component Value Date/Time   NA 134 (L) 01/30/2019 2025   K 3.7 01/30/2019 2025   CL 98 01/30/2019 2025   CO2 27 01/30/2019 2025   GLUCOSE 166 (H) 01/30/2019 2025   BUN 43 (H) 01/30/2019 2025   CREATININE 7.25 (H) 01/30/2019 2025   CREATININE 1.90 (H) 03/07/2012 1652   CALCIUM 8.7 (L) 01/30/2019 2025   PROT 8.6 (H) 01/29/2019 0352   ALBUMIN 2.1 (L) 01/30/2019 2025   AST 19 01/29/2019 0352   ALT 14 01/29/2019 0352   ALKPHOS 122 01/29/2019 0352   BILITOT 0.7 01/29/2019 0352   GFRNONAA 6 (L) 01/30/2019 2025   GFRAA 7 (L) 01/30/2019 2025    GFR Estimated Creatinine Clearance: 15.4 mL/min (A) (by C-G formula based on SCr of 7.25 mg/dL (H)). Recent Labs    01/29/19 0352  LIPASE 25   No results for input(s): CKTOTAL, CKMB, CKMBINDEX, TROPONINI in the last 72 hours. Invalid input(s): POCBNP No results for input(s): DDIMER in the last 72 hours. No results for input(s): HGBA1C in the last 72 hours. No results for input(s): CHOL, HDL, LDLCALC, TRIG, CHOLHDL, LDLDIRECT in the last 72 hours. No results for input(s): TSH, T4TOTAL, T3FREE, THYROIDAB in the last 72 hours.  Invalid input(s): FREET3 No results for input(s): VITAMINB12, FOLATE, FERRITIN, TIBC, IRON, RETICCTPCT in the last 72 hours. Coags: No results for input(s): INR in the last 72 hours.  Invalid input(s):  PT Microbiology: Recent Results (from the past 240 hour(s))  Blood Culture (routine x 2)     Status: None (Preliminary result)   Collection Time: 01/27/19  8:12 PM  Result Value Ref Range Status   Specimen Description BLOOD RIGHT ANTECUBITAL  Final   Special Requests   Final    BOTTLES DRAWN AEROBIC AND ANAEROBIC Blood Culture adequate volume   Culture   Final    NO GROWTH 3 DAYS Performed at [facility], [address] [place], [place] [address]    Report Status PENDING  Incomplete  Blood Culture (routine x 2)     Status: None (Preliminary result)   Collection Time: 01/27/19  8:12 PM  Result Value Ref Range Status   Specimen Description BLOOD RIGHT WRIST  Final   Special Requests   Final    BOTTLES DRAWN AEROBIC AND ANAEROBIC Blood Culture results may not be optimal due to an inadequate volume of blood received in culture bottles   Culture   Final    NO GROWTH 3 DAYS Performed at [facility], [place] [address], [place], [place] [address]    Report Status PENDING  Incomplete    FURTHER DISCHARGE INSTRUCTIONS:  Get Medicines reviewed and adjusted: Please take all your medications with you for your next visit with your Primary MD  Laboratory/radiological data: Please request your Primary MD to  go over all hospital tests and procedure/radiological results at the follow up, please ask your Primary MD to get all Hospital records sent to his/her office.  In some cases, they will be blood work, cultures and biopsy results pending at the time of your discharge. Please request that your primary care M.D. goes through all the records of your hospital data and follows up on these results.  Also Note the following: If you experience worsening of your admission symptoms, develop shortness of breath, life threatening emergency, suicidal or homicidal thoughts you must seek medical attention immediately by calling 911 or calling your MD immediately  if symptoms less severe.  You  must read complete instructions/literature along with all the possible adverse reactions/side effects for all the Medicines you take and that have been prescribed to you. Take any new Medicines after you have completely understood and accpet all the possible adverse reactions/side effects.   Do not drive when taking Pain medications or sleeping medications (Benzodaizepines)  Do not take more than prescribed Pain, Sleep and Anxiety Medications. It is not advisable to combine anxiety,sleep and pain medications without talking with your primary care practitioner  Special Instructions: If you have smoked or chewed Tobacco  in the last 2 yrs please stop smoking, stop any regular Alcohol  and or any Recreational drug use.  Wear Seat belts while driving.  Please note: You were cared for by a hospitalist during your hospital stay. Once you are discharged, your primary care physician will handle any further medical issues. Please note that NO REFILLS for any discharge medications will be authorized once you are discharged, as it is imperative that you return to your primary care physician (or establish a relationship with a primary care physician if you do not have one) for your post hospital discharge needs so that they can reassess your need for medications and monitor your lab values.  Total Time spent coordinating discharge including counseling, education and face to face time equals  35 minutes.  SignedOren Binet 01/31/2019 8:55 AM

## 2019-02-01 DIAGNOSIS — L732 Hidradenitis suppurativa: Secondary | ICD-10-CM | POA: Diagnosis not present

## 2019-02-01 DIAGNOSIS — N186 End stage renal disease: Secondary | ICD-10-CM | POA: Diagnosis not present

## 2019-02-01 DIAGNOSIS — N2581 Secondary hyperparathyroidism of renal origin: Secondary | ICD-10-CM | POA: Diagnosis not present

## 2019-02-01 DIAGNOSIS — D509 Iron deficiency anemia, unspecified: Secondary | ICD-10-CM | POA: Diagnosis not present

## 2019-02-01 DIAGNOSIS — D631 Anemia in chronic kidney disease: Secondary | ICD-10-CM | POA: Diagnosis not present

## 2019-02-01 DIAGNOSIS — E1129 Type 2 diabetes mellitus with other diabetic kidney complication: Secondary | ICD-10-CM | POA: Diagnosis not present

## 2019-02-01 LAB — CULTURE, BLOOD (ROUTINE X 2)
Culture: NO GROWTH
Culture: NO GROWTH
Special Requests: ADEQUATE

## 2019-02-03 DIAGNOSIS — N2581 Secondary hyperparathyroidism of renal origin: Secondary | ICD-10-CM | POA: Diagnosis not present

## 2019-02-03 DIAGNOSIS — E1129 Type 2 diabetes mellitus with other diabetic kidney complication: Secondary | ICD-10-CM | POA: Diagnosis not present

## 2019-02-03 DIAGNOSIS — N186 End stage renal disease: Secondary | ICD-10-CM | POA: Diagnosis not present

## 2019-02-03 DIAGNOSIS — D509 Iron deficiency anemia, unspecified: Secondary | ICD-10-CM | POA: Diagnosis not present

## 2019-02-03 DIAGNOSIS — D631 Anemia in chronic kidney disease: Secondary | ICD-10-CM | POA: Diagnosis not present

## 2019-02-03 DIAGNOSIS — L732 Hidradenitis suppurativa: Secondary | ICD-10-CM | POA: Diagnosis not present

## 2019-02-05 DIAGNOSIS — E1129 Type 2 diabetes mellitus with other diabetic kidney complication: Secondary | ICD-10-CM | POA: Diagnosis not present

## 2019-02-05 DIAGNOSIS — D509 Iron deficiency anemia, unspecified: Secondary | ICD-10-CM | POA: Diagnosis not present

## 2019-02-05 DIAGNOSIS — N186 End stage renal disease: Secondary | ICD-10-CM | POA: Diagnosis not present

## 2019-02-05 DIAGNOSIS — D631 Anemia in chronic kidney disease: Secondary | ICD-10-CM | POA: Diagnosis not present

## 2019-02-05 DIAGNOSIS — L732 Hidradenitis suppurativa: Secondary | ICD-10-CM | POA: Diagnosis not present

## 2019-02-05 DIAGNOSIS — N2581 Secondary hyperparathyroidism of renal origin: Secondary | ICD-10-CM | POA: Diagnosis not present

## 2019-02-06 DIAGNOSIS — N186 End stage renal disease: Secondary | ICD-10-CM | POA: Diagnosis not present

## 2019-02-06 DIAGNOSIS — R234 Changes in skin texture: Secondary | ICD-10-CM | POA: Diagnosis not present

## 2019-02-06 DIAGNOSIS — L732 Hidradenitis suppurativa: Secondary | ICD-10-CM | POA: Diagnosis not present

## 2019-02-06 DIAGNOSIS — Z992 Dependence on renal dialysis: Secondary | ICD-10-CM | POA: Diagnosis not present

## 2019-02-06 DIAGNOSIS — M79641 Pain in right hand: Secondary | ICD-10-CM | POA: Diagnosis not present

## 2019-02-06 DIAGNOSIS — R61 Generalized hyperhidrosis: Secondary | ICD-10-CM | POA: Diagnosis not present

## 2019-02-06 DIAGNOSIS — A419 Sepsis, unspecified organism: Secondary | ICD-10-CM | POA: Diagnosis not present

## 2019-02-06 DIAGNOSIS — M79642 Pain in left hand: Secondary | ICD-10-CM | POA: Diagnosis not present

## 2019-02-06 DIAGNOSIS — J9620 Acute and chronic respiratory failure, unspecified whether with hypoxia or hypercapnia: Secondary | ICD-10-CM | POA: Diagnosis not present

## 2019-02-07 DIAGNOSIS — D631 Anemia in chronic kidney disease: Secondary | ICD-10-CM | POA: Diagnosis not present

## 2019-02-07 DIAGNOSIS — E1129 Type 2 diabetes mellitus with other diabetic kidney complication: Secondary | ICD-10-CM | POA: Diagnosis not present

## 2019-02-07 DIAGNOSIS — N186 End stage renal disease: Secondary | ICD-10-CM | POA: Diagnosis not present

## 2019-02-07 DIAGNOSIS — N2581 Secondary hyperparathyroidism of renal origin: Secondary | ICD-10-CM | POA: Diagnosis not present

## 2019-02-07 DIAGNOSIS — D509 Iron deficiency anemia, unspecified: Secondary | ICD-10-CM | POA: Diagnosis not present

## 2019-02-07 DIAGNOSIS — L732 Hidradenitis suppurativa: Secondary | ICD-10-CM | POA: Diagnosis not present

## 2019-02-10 DIAGNOSIS — D631 Anemia in chronic kidney disease: Secondary | ICD-10-CM | POA: Diagnosis not present

## 2019-02-10 DIAGNOSIS — E1129 Type 2 diabetes mellitus with other diabetic kidney complication: Secondary | ICD-10-CM | POA: Diagnosis not present

## 2019-02-10 DIAGNOSIS — N2581 Secondary hyperparathyroidism of renal origin: Secondary | ICD-10-CM | POA: Diagnosis not present

## 2019-02-10 DIAGNOSIS — L732 Hidradenitis suppurativa: Secondary | ICD-10-CM | POA: Diagnosis not present

## 2019-02-10 DIAGNOSIS — N186 End stage renal disease: Secondary | ICD-10-CM | POA: Diagnosis not present

## 2019-02-10 DIAGNOSIS — D509 Iron deficiency anemia, unspecified: Secondary | ICD-10-CM | POA: Diagnosis not present

## 2019-02-12 DIAGNOSIS — D631 Anemia in chronic kidney disease: Secondary | ICD-10-CM | POA: Diagnosis not present

## 2019-02-12 DIAGNOSIS — E1129 Type 2 diabetes mellitus with other diabetic kidney complication: Secondary | ICD-10-CM | POA: Diagnosis not present

## 2019-02-12 DIAGNOSIS — D509 Iron deficiency anemia, unspecified: Secondary | ICD-10-CM | POA: Diagnosis not present

## 2019-02-12 DIAGNOSIS — N186 End stage renal disease: Secondary | ICD-10-CM | POA: Diagnosis not present

## 2019-02-12 DIAGNOSIS — L732 Hidradenitis suppurativa: Secondary | ICD-10-CM | POA: Diagnosis not present

## 2019-02-12 DIAGNOSIS — N2581 Secondary hyperparathyroidism of renal origin: Secondary | ICD-10-CM | POA: Diagnosis not present

## 2019-02-14 DIAGNOSIS — N2581 Secondary hyperparathyroidism of renal origin: Secondary | ICD-10-CM | POA: Diagnosis not present

## 2019-02-14 DIAGNOSIS — E1129 Type 2 diabetes mellitus with other diabetic kidney complication: Secondary | ICD-10-CM | POA: Diagnosis not present

## 2019-02-14 DIAGNOSIS — L732 Hidradenitis suppurativa: Secondary | ICD-10-CM | POA: Diagnosis not present

## 2019-02-14 DIAGNOSIS — D509 Iron deficiency anemia, unspecified: Secondary | ICD-10-CM | POA: Diagnosis not present

## 2019-02-14 DIAGNOSIS — N186 End stage renal disease: Secondary | ICD-10-CM | POA: Diagnosis not present

## 2019-02-14 DIAGNOSIS — D631 Anemia in chronic kidney disease: Secondary | ICD-10-CM | POA: Diagnosis not present

## 2019-02-17 DIAGNOSIS — L732 Hidradenitis suppurativa: Secondary | ICD-10-CM | POA: Diagnosis not present

## 2019-02-17 DIAGNOSIS — N186 End stage renal disease: Secondary | ICD-10-CM | POA: Diagnosis not present

## 2019-02-17 DIAGNOSIS — N2581 Secondary hyperparathyroidism of renal origin: Secondary | ICD-10-CM | POA: Diagnosis not present

## 2019-02-17 DIAGNOSIS — D509 Iron deficiency anemia, unspecified: Secondary | ICD-10-CM | POA: Diagnosis not present

## 2019-02-17 DIAGNOSIS — E1129 Type 2 diabetes mellitus with other diabetic kidney complication: Secondary | ICD-10-CM | POA: Diagnosis not present

## 2019-02-17 DIAGNOSIS — D631 Anemia in chronic kidney disease: Secondary | ICD-10-CM | POA: Diagnosis not present

## 2019-02-19 DIAGNOSIS — L732 Hidradenitis suppurativa: Secondary | ICD-10-CM | POA: Diagnosis not present

## 2019-02-19 DIAGNOSIS — N186 End stage renal disease: Secondary | ICD-10-CM | POA: Diagnosis not present

## 2019-02-19 DIAGNOSIS — N2581 Secondary hyperparathyroidism of renal origin: Secondary | ICD-10-CM | POA: Diagnosis not present

## 2019-02-19 DIAGNOSIS — D631 Anemia in chronic kidney disease: Secondary | ICD-10-CM | POA: Diagnosis not present

## 2019-02-19 DIAGNOSIS — D509 Iron deficiency anemia, unspecified: Secondary | ICD-10-CM | POA: Diagnosis not present

## 2019-02-19 DIAGNOSIS — E1129 Type 2 diabetes mellitus with other diabetic kidney complication: Secondary | ICD-10-CM | POA: Diagnosis not present

## 2019-02-21 DIAGNOSIS — D631 Anemia in chronic kidney disease: Secondary | ICD-10-CM | POA: Diagnosis not present

## 2019-02-21 DIAGNOSIS — L732 Hidradenitis suppurativa: Secondary | ICD-10-CM | POA: Diagnosis not present

## 2019-02-21 DIAGNOSIS — D509 Iron deficiency anemia, unspecified: Secondary | ICD-10-CM | POA: Diagnosis not present

## 2019-02-21 DIAGNOSIS — N2581 Secondary hyperparathyroidism of renal origin: Secondary | ICD-10-CM | POA: Diagnosis not present

## 2019-02-21 DIAGNOSIS — N186 End stage renal disease: Secondary | ICD-10-CM | POA: Diagnosis not present

## 2019-02-21 DIAGNOSIS — E1129 Type 2 diabetes mellitus with other diabetic kidney complication: Secondary | ICD-10-CM | POA: Diagnosis not present

## 2019-02-24 DIAGNOSIS — D631 Anemia in chronic kidney disease: Secondary | ICD-10-CM | POA: Diagnosis not present

## 2019-02-24 DIAGNOSIS — L732 Hidradenitis suppurativa: Secondary | ICD-10-CM | POA: Diagnosis not present

## 2019-02-24 DIAGNOSIS — N2581 Secondary hyperparathyroidism of renal origin: Secondary | ICD-10-CM | POA: Diagnosis not present

## 2019-02-24 DIAGNOSIS — D509 Iron deficiency anemia, unspecified: Secondary | ICD-10-CM | POA: Diagnosis not present

## 2019-02-24 DIAGNOSIS — N186 End stage renal disease: Secondary | ICD-10-CM | POA: Diagnosis not present

## 2019-02-24 DIAGNOSIS — E1129 Type 2 diabetes mellitus with other diabetic kidney complication: Secondary | ICD-10-CM | POA: Diagnosis not present

## 2019-02-25 ENCOUNTER — Ambulatory Visit: Payer: BC Managed Care – PPO | Admitting: Podiatry

## 2019-02-26 DIAGNOSIS — N2581 Secondary hyperparathyroidism of renal origin: Secondary | ICD-10-CM | POA: Diagnosis not present

## 2019-02-26 DIAGNOSIS — N186 End stage renal disease: Secondary | ICD-10-CM | POA: Diagnosis not present

## 2019-02-26 DIAGNOSIS — D509 Iron deficiency anemia, unspecified: Secondary | ICD-10-CM | POA: Diagnosis not present

## 2019-02-26 DIAGNOSIS — D631 Anemia in chronic kidney disease: Secondary | ICD-10-CM | POA: Diagnosis not present

## 2019-02-26 DIAGNOSIS — E8779 Other fluid overload: Secondary | ICD-10-CM | POA: Diagnosis not present

## 2019-02-26 DIAGNOSIS — E1129 Type 2 diabetes mellitus with other diabetic kidney complication: Secondary | ICD-10-CM | POA: Diagnosis not present

## 2019-02-26 DIAGNOSIS — L732 Hidradenitis suppurativa: Secondary | ICD-10-CM | POA: Diagnosis not present

## 2019-02-26 DIAGNOSIS — Z992 Dependence on renal dialysis: Secondary | ICD-10-CM | POA: Diagnosis not present

## 2019-02-28 DIAGNOSIS — D509 Iron deficiency anemia, unspecified: Secondary | ICD-10-CM | POA: Diagnosis not present

## 2019-02-28 DIAGNOSIS — N2581 Secondary hyperparathyroidism of renal origin: Secondary | ICD-10-CM | POA: Diagnosis not present

## 2019-02-28 DIAGNOSIS — N186 End stage renal disease: Secondary | ICD-10-CM | POA: Diagnosis not present

## 2019-02-28 DIAGNOSIS — L732 Hidradenitis suppurativa: Secondary | ICD-10-CM | POA: Diagnosis not present

## 2019-02-28 DIAGNOSIS — E1129 Type 2 diabetes mellitus with other diabetic kidney complication: Secondary | ICD-10-CM | POA: Diagnosis not present

## 2019-02-28 DIAGNOSIS — D631 Anemia in chronic kidney disease: Secondary | ICD-10-CM | POA: Diagnosis not present

## 2019-03-03 DIAGNOSIS — N186 End stage renal disease: Secondary | ICD-10-CM | POA: Diagnosis not present

## 2019-03-03 DIAGNOSIS — L732 Hidradenitis suppurativa: Secondary | ICD-10-CM | POA: Diagnosis not present

## 2019-03-03 DIAGNOSIS — D631 Anemia in chronic kidney disease: Secondary | ICD-10-CM | POA: Diagnosis not present

## 2019-03-03 DIAGNOSIS — E1129 Type 2 diabetes mellitus with other diabetic kidney complication: Secondary | ICD-10-CM | POA: Diagnosis not present

## 2019-03-03 DIAGNOSIS — D509 Iron deficiency anemia, unspecified: Secondary | ICD-10-CM | POA: Diagnosis not present

## 2019-03-03 DIAGNOSIS — N2581 Secondary hyperparathyroidism of renal origin: Secondary | ICD-10-CM | POA: Diagnosis not present

## 2019-03-05 DIAGNOSIS — N2581 Secondary hyperparathyroidism of renal origin: Secondary | ICD-10-CM | POA: Diagnosis not present

## 2019-03-05 DIAGNOSIS — E1129 Type 2 diabetes mellitus with other diabetic kidney complication: Secondary | ICD-10-CM | POA: Diagnosis not present

## 2019-03-05 DIAGNOSIS — N186 End stage renal disease: Secondary | ICD-10-CM | POA: Diagnosis not present

## 2019-03-05 DIAGNOSIS — L732 Hidradenitis suppurativa: Secondary | ICD-10-CM | POA: Diagnosis not present

## 2019-03-05 DIAGNOSIS — D509 Iron deficiency anemia, unspecified: Secondary | ICD-10-CM | POA: Diagnosis not present

## 2019-03-05 DIAGNOSIS — D631 Anemia in chronic kidney disease: Secondary | ICD-10-CM | POA: Diagnosis not present

## 2019-03-07 DIAGNOSIS — L732 Hidradenitis suppurativa: Secondary | ICD-10-CM | POA: Diagnosis not present

## 2019-03-07 DIAGNOSIS — N2581 Secondary hyperparathyroidism of renal origin: Secondary | ICD-10-CM | POA: Diagnosis not present

## 2019-03-07 DIAGNOSIS — D631 Anemia in chronic kidney disease: Secondary | ICD-10-CM | POA: Diagnosis not present

## 2019-03-07 DIAGNOSIS — E1129 Type 2 diabetes mellitus with other diabetic kidney complication: Secondary | ICD-10-CM | POA: Diagnosis not present

## 2019-03-07 DIAGNOSIS — N186 End stage renal disease: Secondary | ICD-10-CM | POA: Diagnosis not present

## 2019-03-07 DIAGNOSIS — D509 Iron deficiency anemia, unspecified: Secondary | ICD-10-CM | POA: Diagnosis not present

## 2019-03-10 DIAGNOSIS — L732 Hidradenitis suppurativa: Secondary | ICD-10-CM | POA: Diagnosis not present

## 2019-03-10 DIAGNOSIS — D509 Iron deficiency anemia, unspecified: Secondary | ICD-10-CM | POA: Diagnosis not present

## 2019-03-10 DIAGNOSIS — D631 Anemia in chronic kidney disease: Secondary | ICD-10-CM | POA: Diagnosis not present

## 2019-03-10 DIAGNOSIS — N186 End stage renal disease: Secondary | ICD-10-CM | POA: Diagnosis not present

## 2019-03-10 DIAGNOSIS — N2581 Secondary hyperparathyroidism of renal origin: Secondary | ICD-10-CM | POA: Diagnosis not present

## 2019-03-10 DIAGNOSIS — E1129 Type 2 diabetes mellitus with other diabetic kidney complication: Secondary | ICD-10-CM | POA: Diagnosis not present

## 2019-03-12 DIAGNOSIS — D509 Iron deficiency anemia, unspecified: Secondary | ICD-10-CM | POA: Diagnosis not present

## 2019-03-12 DIAGNOSIS — N186 End stage renal disease: Secondary | ICD-10-CM | POA: Diagnosis not present

## 2019-03-12 DIAGNOSIS — E1129 Type 2 diabetes mellitus with other diabetic kidney complication: Secondary | ICD-10-CM | POA: Diagnosis not present

## 2019-03-12 DIAGNOSIS — N2581 Secondary hyperparathyroidism of renal origin: Secondary | ICD-10-CM | POA: Diagnosis not present

## 2019-03-12 DIAGNOSIS — L732 Hidradenitis suppurativa: Secondary | ICD-10-CM | POA: Diagnosis not present

## 2019-03-12 DIAGNOSIS — D631 Anemia in chronic kidney disease: Secondary | ICD-10-CM | POA: Diagnosis not present

## 2019-03-13 ENCOUNTER — Encounter: Payer: Self-pay | Admitting: Vascular Surgery

## 2019-03-13 ENCOUNTER — Other Ambulatory Visit: Payer: Self-pay | Admitting: Vascular Surgery

## 2019-03-14 DIAGNOSIS — N2581 Secondary hyperparathyroidism of renal origin: Secondary | ICD-10-CM | POA: Diagnosis not present

## 2019-03-14 DIAGNOSIS — E1129 Type 2 diabetes mellitus with other diabetic kidney complication: Secondary | ICD-10-CM | POA: Diagnosis not present

## 2019-03-14 DIAGNOSIS — N186 End stage renal disease: Secondary | ICD-10-CM | POA: Diagnosis not present

## 2019-03-14 DIAGNOSIS — D631 Anemia in chronic kidney disease: Secondary | ICD-10-CM | POA: Diagnosis not present

## 2019-03-14 DIAGNOSIS — L732 Hidradenitis suppurativa: Secondary | ICD-10-CM | POA: Diagnosis not present

## 2019-03-14 DIAGNOSIS — D509 Iron deficiency anemia, unspecified: Secondary | ICD-10-CM | POA: Diagnosis not present

## 2019-03-17 DIAGNOSIS — N186 End stage renal disease: Secondary | ICD-10-CM | POA: Diagnosis not present

## 2019-03-17 DIAGNOSIS — E1129 Type 2 diabetes mellitus with other diabetic kidney complication: Secondary | ICD-10-CM | POA: Diagnosis not present

## 2019-03-17 DIAGNOSIS — L732 Hidradenitis suppurativa: Secondary | ICD-10-CM | POA: Diagnosis not present

## 2019-03-17 DIAGNOSIS — N2581 Secondary hyperparathyroidism of renal origin: Secondary | ICD-10-CM | POA: Diagnosis not present

## 2019-03-17 DIAGNOSIS — D509 Iron deficiency anemia, unspecified: Secondary | ICD-10-CM | POA: Diagnosis not present

## 2019-03-17 DIAGNOSIS — D631 Anemia in chronic kidney disease: Secondary | ICD-10-CM | POA: Diagnosis not present

## 2019-03-19 DIAGNOSIS — N186 End stage renal disease: Secondary | ICD-10-CM | POA: Diagnosis not present

## 2019-03-19 DIAGNOSIS — D631 Anemia in chronic kidney disease: Secondary | ICD-10-CM | POA: Diagnosis not present

## 2019-03-19 DIAGNOSIS — E1129 Type 2 diabetes mellitus with other diabetic kidney complication: Secondary | ICD-10-CM | POA: Diagnosis not present

## 2019-03-19 DIAGNOSIS — L732 Hidradenitis suppurativa: Secondary | ICD-10-CM | POA: Diagnosis not present

## 2019-03-19 DIAGNOSIS — D509 Iron deficiency anemia, unspecified: Secondary | ICD-10-CM | POA: Diagnosis not present

## 2019-03-19 DIAGNOSIS — N2581 Secondary hyperparathyroidism of renal origin: Secondary | ICD-10-CM | POA: Diagnosis not present

## 2019-03-21 ENCOUNTER — Telehealth: Payer: Self-pay | Admitting: Cardiology

## 2019-03-21 DIAGNOSIS — N2581 Secondary hyperparathyroidism of renal origin: Secondary | ICD-10-CM | POA: Diagnosis not present

## 2019-03-21 DIAGNOSIS — N186 End stage renal disease: Secondary | ICD-10-CM | POA: Diagnosis not present

## 2019-03-21 DIAGNOSIS — D631 Anemia in chronic kidney disease: Secondary | ICD-10-CM | POA: Diagnosis not present

## 2019-03-21 DIAGNOSIS — D509 Iron deficiency anemia, unspecified: Secondary | ICD-10-CM | POA: Diagnosis not present

## 2019-03-21 DIAGNOSIS — L732 Hidradenitis suppurativa: Secondary | ICD-10-CM | POA: Diagnosis not present

## 2019-03-21 DIAGNOSIS — E1129 Type 2 diabetes mellitus with other diabetic kidney complication: Secondary | ICD-10-CM | POA: Diagnosis not present

## 2019-03-21 NOTE — Telephone Encounter (Signed)
Video/[email or web address].me/smartphone/verbal consent 03/21/19  YOUR CARDIOLOGY TEAM HAS ARRANGED FOR AN E-VISIT FOR YOUR APPOINTMENT - PLEASE REVIEW IMPORTANT INFORMATION BELOW SEVERAL DAYS PRIOR TO YOUR APPOINTMENT  Due to the recent COVID-19 pandemic, we are transitioning in-person office visits to tele-medicine visits in an effort to decrease unnecessary exposure to our patients, their families, and staff. These visits are billed to your insurance just like a normal visit is. We also encourage you to sign up for MyChart if you have not already done so. You will need a smartphone if possible. For patients that do not have this, we can still complete the visit using a regular telephone but do prefer a smartphone to enable video when possible. You may have a family member that lives with you that can help. If possible, we also ask that you have a blood pressure cuff and scale at home to measure your blood pressure, heart rate and weight prior to your scheduled appointment. Patients with clinical needs that need an in-person evaluation and testing will still be able to come to the office if absolutely necessary. If you have any questions, feel free to call our office.      YOUR PROVIDER WILL BE USING THE FOLLOWING PLATFORM TO COMPLETE YOUR VISIT:   [email or web address]    IF USING MYCHART - How to Download the MyChart App to Your SmartPhone   - If Apple, go to [employer] and type in MyChart in the search bar and download the app. If Android, ask patient to go to [employer] and type in [place] in the search bar and download the app. The app is free but as with any other app downloads, your phone may require you to verify saved payment information or Apple/Android password.  - You will need to then log into the app with your MyChart username and password, and select [facility] as your healthcare provider to link the account.  - When it is time for your visit, go to the MyChart app, find appointments, and click Begin  Video Visit. Be sure to Select Allow for your device to access the Microphone and Camera for your visit. You will then be connected, and your provider will be with you shortly.  **If you have any issues connecting or need assistance, please contact MyChart service desk [phone])**  **If using a computer, in order to ensure the best quality for your visit, you will need to use either of the following Internet Browsers: Insurance underwriter or Microsoft Edge**   IF USING DOXIMITY or [email or web address] - The staff will give you instructions on receiving your link to join the meeting the day of your visit.      2-3 DAYS BEFORE YOUR APPOINTMENT  You will receive a telephone call from one of our [facility] team members - your caller ID may say "Unknown caller." If this is a video visit, we will walk you through how to get the video launched on your phone. We will remind you check your blood pressure, heart rate and weight prior to your scheduled appointment. If you have an Apple Watch or Kardia, please upload any pertinent ECG strips the day before or morning of your appointment to [facility]. Our staff will also make sure you have reviewed the consent and agree to move forward with your scheduled tele-health visit.     THE DAY OF YOUR APPOINTMENT  Approximately 15 minutes prior to your scheduled appointment, you will receive a telephone call from one of [facility] team -  your caller ID may say "Unknown caller."  Our staff will confirm medications, vital signs for the day and any symptoms you may be experiencing. Please have this information available prior to the time of visit start. It may also be helpful for you to have a pad of paper and pen handy for any instructions given during your visit. They will also walk you through joining the smartphone meeting if this is a video visit.    CONSENT FOR TELE-HEALTH VISIT - PLEASE REVIEW  I hereby voluntarily request, consent and authorize [facility] and  its employed or contracted physicians, physician assistants, nurse practitioners or other licensed health care professionals (the Practitioner), to provide me with telemedicine health care services (the Services") as deemed necessary by the treating Practitioner. I acknowledge and consent to receive the Services by the Practitioner via telemedicine. I understand that the telemedicine visit will involve communicating with the Practitioner through live audiovisual communication technology and the disclosure of certain medical information by electronic transmission. I acknowledge that I have been given the opportunity to request an in-person assessment or other available alternative prior to the telemedicine visit and am voluntarily participating in the telemedicine visit.  I understand that I have the right to withhold or withdraw my consent to the use of telemedicine in the course of my care at any time, without affecting my right to future care or treatment, and that the Practitioner or I may terminate the telemedicine visit at any time. I understand that I have the right to inspect all information obtained and/or recorded in the course of the telemedicine visit and may receive copies of available information for a reasonable fee.  I understand that some of the potential risks of receiving the Services via telemedicine include:   Delay or interruption in medical evaluation due to technological equipment failure or disruption;  Information transmitted may not be sufficient (e.g. poor resolution of images) to allow for appropriate medical decision making by the Practitioner; and/or   In rare instances, security protocols could fail, causing a breach of personal health information.  Furthermore, I acknowledge that it is my responsibility to provide information about my medical history, conditions and care that is complete and accurate to the best of my ability. I acknowledge that Practitioner's advice,  recommendations, and/or decision may be based on factors not within their control, such as incomplete or inaccurate data provided by me or distortions of diagnostic images or specimens that may result from electronic transmissions. I understand that the practice of medicine is not an exact science and that Practitioner makes no warranties or guarantees regarding treatment outcomes. I acknowledge that I will receive a copy of this consent concurrently upon execution via email to the email address I last provided but may also request a printed copy by calling the office of [facility].    I understand that my insurance will be billed for this visit.   I have read or had this consent read to me.  I understand the contents of this consent, which adequately explains the benefits and risks of the Services being provided via telemedicine.   I have been provided ample opportunity to ask questions regarding this consent and the Services and have had my questions answered to my satisfaction.  I give my informed consent for the services to be provided through the use of telemedicine in my medical care  By participating in this telemedicine visit I agree to the above.

## 2019-03-24 ENCOUNTER — Observation Stay (HOSPITAL_COMMUNITY)
Admission: EM | Admit: 2019-03-24 | Discharge: 2019-03-25 | Disposition: A | Discharge status: Home / Self Care | Payer: Medicare Other | Attending: Internal Medicine | Admitting: Internal Medicine

## 2019-03-24 ENCOUNTER — Other Ambulatory Visit: Payer: Self-pay

## 2019-03-24 ENCOUNTER — Emergency Department (HOSPITAL_COMMUNITY): Payer: Medicare Other

## 2019-03-24 ENCOUNTER — Encounter (HOSPITAL_COMMUNITY): Payer: Self-pay | Admitting: Emergency Medicine

## 2019-03-24 DIAGNOSIS — R0600 Dyspnea, unspecified: Secondary | ICD-10-CM | POA: Diagnosis not present

## 2019-03-24 DIAGNOSIS — I132 Hypertensive heart and chronic kidney disease with heart failure and with stage 5 chronic kidney disease, or end stage renal disease: Secondary | ICD-10-CM | POA: Diagnosis not present

## 2019-03-24 DIAGNOSIS — J449 Chronic obstructive pulmonary disease, unspecified: Secondary | ICD-10-CM | POA: Insufficient documentation

## 2019-03-24 DIAGNOSIS — I4819 Other persistent atrial fibrillation: Secondary | ICD-10-CM | POA: Diagnosis present

## 2019-03-24 DIAGNOSIS — R7989 Other specified abnormal findings of blood chemistry: Secondary | ICD-10-CM

## 2019-03-24 DIAGNOSIS — R509 Fever, unspecified: Secondary | ICD-10-CM | POA: Diagnosis present

## 2019-03-24 DIAGNOSIS — F329 Major depressive disorder, single episode, unspecified: Secondary | ICD-10-CM | POA: Diagnosis not present

## 2019-03-24 DIAGNOSIS — L732 Hidradenitis suppurativa: Secondary | ICD-10-CM | POA: Diagnosis present

## 2019-03-24 DIAGNOSIS — Z87891 Personal history of nicotine dependence: Secondary | ICD-10-CM | POA: Insufficient documentation

## 2019-03-24 DIAGNOSIS — I480 Paroxysmal atrial fibrillation: Secondary | ICD-10-CM | POA: Diagnosis not present

## 2019-03-24 DIAGNOSIS — G4733 Obstructive sleep apnea (adult) (pediatric): Secondary | ICD-10-CM | POA: Diagnosis present

## 2019-03-24 DIAGNOSIS — Z20828 Contact with and (suspected) exposure to other viral communicable diseases: Secondary | ICD-10-CM | POA: Insufficient documentation

## 2019-03-24 DIAGNOSIS — E119 Type 2 diabetes mellitus without complications: Secondary | ICD-10-CM

## 2019-03-24 DIAGNOSIS — E039 Hypothyroidism, unspecified: Secondary | ICD-10-CM | POA: Insufficient documentation

## 2019-03-24 DIAGNOSIS — I130 Hypertensive heart and chronic kidney disease with heart failure and stage 1 through stage 4 chronic kidney disease, or unspecified chronic kidney disease: Secondary | ICD-10-CM | POA: Diagnosis not present

## 2019-03-24 DIAGNOSIS — Z8674 Personal history of sudden cardiac arrest: Secondary | ICD-10-CM | POA: Diagnosis not present

## 2019-03-24 DIAGNOSIS — Z8249 Family history of ischemic heart disease and other diseases of the circulatory system: Secondary | ICD-10-CM | POA: Insufficient documentation

## 2019-03-24 DIAGNOSIS — L8990 Pressure ulcer of unspecified site, unspecified stage: Secondary | ICD-10-CM | POA: Diagnosis present

## 2019-03-24 DIAGNOSIS — R0602 Shortness of breath: Secondary | ICD-10-CM | POA: Diagnosis not present

## 2019-03-24 DIAGNOSIS — I5032 Chronic diastolic (congestive) heart failure: Secondary | ICD-10-CM | POA: Insufficient documentation

## 2019-03-24 DIAGNOSIS — K219 Gastro-esophageal reflux disease without esophagitis: Secondary | ICD-10-CM | POA: Insufficient documentation

## 2019-03-24 DIAGNOSIS — R778 Other specified abnormalities of plasma proteins: Secondary | ICD-10-CM | POA: Diagnosis present

## 2019-03-24 DIAGNOSIS — E785 Hyperlipidemia, unspecified: Secondary | ICD-10-CM | POA: Diagnosis not present

## 2019-03-24 DIAGNOSIS — I429 Cardiomyopathy, unspecified: Secondary | ICD-10-CM | POA: Diagnosis not present

## 2019-03-24 DIAGNOSIS — Z7984 Long term (current) use of oral hypoglycemic drugs: Secondary | ICD-10-CM | POA: Insufficient documentation

## 2019-03-24 DIAGNOSIS — E875 Hyperkalemia: Secondary | ICD-10-CM | POA: Insufficient documentation

## 2019-03-24 DIAGNOSIS — E1121 Type 2 diabetes mellitus with diabetic nephropathy: Secondary | ICD-10-CM | POA: Diagnosis not present

## 2019-03-24 DIAGNOSIS — Z79899 Other long term (current) drug therapy: Secondary | ICD-10-CM | POA: Diagnosis not present

## 2019-03-24 DIAGNOSIS — E11319 Type 2 diabetes mellitus with unspecified diabetic retinopathy without macular edema: Secondary | ICD-10-CM | POA: Insufficient documentation

## 2019-03-24 DIAGNOSIS — Z6841 Body Mass Index (BMI) 40.0 and over, adult: Secondary | ICD-10-CM | POA: Insufficient documentation

## 2019-03-24 DIAGNOSIS — N186 End stage renal disease: Secondary | ICD-10-CM | POA: Diagnosis not present

## 2019-03-24 DIAGNOSIS — R0603 Acute respiratory distress: Secondary | ICD-10-CM | POA: Diagnosis present

## 2019-03-24 DIAGNOSIS — E1122 Type 2 diabetes mellitus with diabetic chronic kidney disease: Secondary | ICD-10-CM | POA: Insufficient documentation

## 2019-03-24 DIAGNOSIS — Z992 Dependence on renal dialysis: Secondary | ICD-10-CM | POA: Insufficient documentation

## 2019-03-24 DIAGNOSIS — Z7982 Long term (current) use of aspirin: Secondary | ICD-10-CM | POA: Insufficient documentation

## 2019-03-24 DIAGNOSIS — I420 Dilated cardiomyopathy: Secondary | ICD-10-CM | POA: Diagnosis not present

## 2019-03-24 DIAGNOSIS — J9610 Chronic respiratory failure, unspecified whether with hypoxia or hypercapnia: Secondary | ICD-10-CM | POA: Insufficient documentation

## 2019-03-24 LAB — BASIC METABOLIC PANEL
Anion gap: 13 (ref 5–15)
BUN: 90 mg/dL — ABNORMAL HIGH (ref 6–20)
CO2: 21 mmol/L — ABNORMAL LOW (ref 22–32)
Calcium: 9.3 mg/dL (ref 8.9–10.3)
Chloride: 99 mmol/L (ref 98–111)
Creatinine, Ser: 12.52 mg/dL — ABNORMAL HIGH (ref 0.44–1.00)
GFR calc Af Amer: 3 mL/min — ABNORMAL LOW (ref >60)
GFR calc non Af Amer: 3 mL/min — ABNORMAL LOW (ref >60)
Glucose, Bld: 130 mg/dL — ABNORMAL HIGH (ref 70–99)
Potassium: 5.5 mmol/L — ABNORMAL HIGH (ref 3.5–5.1)
Sodium: 133 mmol/L — ABNORMAL LOW (ref 135–145)

## 2019-03-24 LAB — CBC WITH DIFFERENTIAL/PLATELET
Abs Immature Granulocytes: 0 10*3/uL (ref 0.00–0.07)
Basophils Absolute: 0 10*3/uL (ref 0.0–0.1)
Basophils Relative: 0 %
Eosinophils Absolute: 0 10*3/uL (ref 0.0–0.5)
Eosinophils Relative: 0 %
HCT: 36.1 % (ref 36.0–46.0)
Hemoglobin: 10.1 g/dL — ABNORMAL LOW (ref 12.0–15.0)
Lymphocytes Relative: 3 %
Lymphs Abs: 0.9 10*3/uL (ref 0.7–4.0)
MCH: 30.1 pg (ref 26.0–34.0)
MCHC: 28 g/dL — ABNORMAL LOW (ref 30.0–36.0)
MCV: 107.8 fL — ABNORMAL HIGH (ref 80.0–100.0)
Monocytes Absolute: 1.2 10*3/uL — ABNORMAL HIGH (ref 0.1–1.0)
Monocytes Relative: 4 %
Neutro Abs: 26.9 10*3/uL — ABNORMAL HIGH (ref 1.7–7.7)
Neutrophils Relative %: 93 %
Platelets: 289 10*3/uL (ref 150–400)
RBC: 3.35 MIL/uL — ABNORMAL LOW (ref 3.87–5.11)
RDW: 16.7 % — ABNORMAL HIGH (ref 11.5–15.5)
WBC: 28.9 10*3/uL — ABNORMAL HIGH (ref 4.0–10.5)
nRBC: 0 % (ref 0.0–0.2)
nRBC: 0 /100 WBC

## 2019-03-24 LAB — CBG MONITORING, ED: Glucose-Capillary: 141 mg/dL — ABNORMAL HIGH (ref 70–99)

## 2019-03-24 LAB — TROPONIN I
Troponin I: 0.14 ng/mL (ref <0.03)
Troponin I: 0.19 ng/mL (ref <0.03)

## 2019-03-24 LAB — LACTIC ACID, PLASMA: Lactic Acid, Venous: 1 mmol/L (ref 0.5–1.9)

## 2019-03-24 LAB — BRAIN NATRIURETIC PEPTIDE: B Natriuretic Peptide: 277.2 pg/mL — ABNORMAL HIGH (ref 0.0–100.0)

## 2019-03-24 LAB — SARS CORONAVIRUS 2 BY RT PCR (HOSPITAL ORDER, PERFORMED IN ~~LOC~~ HOSPITAL LAB): SARS Coronavirus 2: NEGATIVE

## 2019-03-24 MED ORDER — ASPIRIN 81 MG PO CHEW
324.0000 mg | CHEWABLE_TABLET | Freq: Once | ORAL | Status: AC
  Administered 2019-03-24: 21:00:00 324 mg via ORAL
  Filled 2019-03-24: qty 4

## 2019-03-24 NOTE — Progress Notes (Signed)
Pharmacy Antibiotic Note  [name] is a 58 y.o. female admitted on 03/24/2019 with leukocytosis, pneumonia.  Pharmacy has been consulted for Cefepime and Vancomycin dosing.  Pt with ESRD (M/W/F dialysis) - missed HD today. Last wt 195 kg at HD 01/31/19 per notes.  Pt with PCN allergy but has tolerated Cefepime during past admissions and as an o/p  Plan: Vancomycin 2500mg  IV now then 1000mg  IV qHD Cefepime 2gm IV now then 2gm qHD F/u HD schedule and tolerance F/u current weight Pre-HD level at [facility]  Height: 5' 6.5" (168.9 cm) IBW/kg (Calculated) : 60.45  Temp (24hrs), Avg:98.9 F (37.2 C), Min:98.7 F (37.1 C), Max:99.1 F (37.3 C)  Recent Labs  Lab 03/24/19 1656  WBC 28.9*  CREATININE 12.52*  LATICACIDVEN 1.0    CrCl cannot be calculated (Unknown ideal weight.).    Allergies  Allergen Reactions  . Rosiglitazone Other (See Comments)    "Avandia" = Reaction not recalled  . Amoxicillin Rash and Other (See Comments)    Tolerated Zosyn 01/2013. [facility] Has patient had a PCN reaction causing immediate rash, facial/tongue/throat swelling, SOB or lightheadedness with hypotension: Yes Has patient had a PCN reaction causing severe rash involving mucus membranes or skin necrosis: No Has patient had a PCN reaction that required hospitalization: No Has patient had a PCN reaction occurring within the last 10 years: No If all of the above answers are "NO", then may proceed with Cephalosporin use.   Marland Kitchen Amoxicillin-Pot Clavulanate Diarrhea    Antimicrobials this admission: 4/28 Vanc >>  4/28 Cefepime >>   Microbiology results: 4/27 BCx:  4/27 Covid negative  Thank you for allowing pharmacy to be a part of this patient's care.  Sherlon Handing, PharmD, BCPS Clinical pharmacist  **Pharmacist phone directory can now be found on [email or web address] ([email or web address]).  Listed under University. 03/25/2019 12:00 AM

## 2019-03-24 NOTE — ED Notes (Signed)
ED TO INPATIENT HANDOFF REPORT  ED Nurse Name and Phone #: [phone]  S Name/Age/Gender [name] 58 y.o. female Room/Bed: 025C/025C  Code Status   Code Status: Prior  Home/SNF/Other Home Patient oriented to: self, place, time and situation Is this baseline? Yes   Triage Complete: Triage complete  Chief Complaint Fever  Triage Note Pt states she had a fever 99-101 at home. Pt states she had SOB this morning & states right now she feels fine. Pt states she took tylenol 1000mg .    Allergies Allergies  Allergen Reactions  . Rosiglitazone Other (See Comments)    "Avandia" = Reaction not recalled  . Amoxicillin Rash and Other (See Comments)    Tolerated Zosyn 01/2013. [place] Has patient had a PCN reaction causing immediate rash, facial/tongue/throat swelling, SOB or lightheadedness with hypotension: Yes Has patient had a PCN reaction causing severe rash involving mucus membranes or skin necrosis: No Has patient had a PCN reaction that required hospitalization: No Has patient had a PCN reaction occurring within the last 10 years: No If all of the above answers are "NO", then may proceed with Cephalosporin use.   Marland Kitchen Amoxicillin-Pot Clavulanate Diarrhea    Level of Care/Admitting Diagnosis ED Disposition    ED Disposition Condition Comment   Admit  Hospital Area: [facility] [[id number]]  Level of Care: Telemetry Cardiac [103]  I expect the patient will be discharged within 24 hours: No (not a candidate for 5C-Observation unit)  Covid Evaluation: N/A  Diagnosis: Dyspnea [053976]  Admitting Physician: Jani Gravel [[id number]]  Attending Physician: Jani Gravel [[id number]]  PT Class (Do Not Modify): Observation [104]  PT Acc Code (Do Not Modify): Observation [10022]       B Medical/Surgery History Past Medical History:  Diagnosis Date  . Chronic diastolic CHF (congestive heart failure) ([place])   . DCM (dilated cardiomyopathy) ([place])    a. presumed nonischemic - no  prior cath/nuc. EF 35-40% in 2011, most recently  60-65% by echo 2017.  Marland Kitchen Depression   . DM type 2 (diabetes mellitus, type 2) ([facility])    diet controlledwith retinopathy and nephropathy  . ESRD on hemodialysis ([place]) 08/21/2017  . GERD (gastroesophageal reflux disease)   . H/O cardiac arrest 10/2012  . Hard of hearing 10/2012   now wears 2 hearing aids  . History of hemodialysis dec 2013  . HTN (hypertension)   . Hydradenitis   . Hyperkalemia   . Hyperlipidemia   . Hypothyroidism   . Iron deficiency anemia   . Morbid obesity ([place])   . Multinodular goiter   . Neuropathic pain 08/21/2017  . Orthostatic hypotension 06/27/2018  . PAF (paroxysmal atrial fibrillation) ([place])   . Paroxysmal atrial flutter ([place])   . Pneumonia Nov 10, 2012  . Sleep apnea    Intolerant to CPAP  . Tinnitus 12/26/2016  . Typical atrial flutter ([place]) 04/10/2017  . Urinary tract infection    taking antibiotics for 3 days prior to surgery   Past Surgical History:  Procedure Laterality Date  . AV FISTULA PLACEMENT Left 07/25/2013   Procedure: ARTERIOVENOUS (AV) FISTULA CREATION ;  Surgeon: Rosetta Posner, MD;  Location: [facility];  Service: Vascular;  Laterality: Left;  . AV FISTULA PLACEMENT Left 10/10/2018   Procedure: INSERTION OF LEFT UPPER ARM brachial to axillary artery ARTERIOVENOUS (AV) GORE-TEX GRAFT using 4-7 stretch graft;  Surgeon: Waynetta Sandy, MD;  Location: [facility];  Service: Vascular;  Laterality: Left;  . CYSTOSCOPY W/  RETROGRADES  11/16/2012   Procedure: CYSTOSCOPY WITH RETROGRADE PYELOGRAM;  Surgeon: Reece Packer, MD;  Location[facility];  Service: Urology;  Laterality: Bilateral;  CYSTOSCOPY,BILATERAL RETROGRADE PYELOGRAM/ REMOVAL LEFT URETERAL STENT/ FULGERATION BLADDER MUCOSA/ INSERTION RIGHT URETERAL STENT  . CYSTOSCOPY W/ URETERAL STENT PLACEMENT  05/28/2012   Procedure: CYSTOSCOPY WITH RETROGRADE PYELOGRAM/URETERAL STENT PLACEMENT;  Surgeon: Reece Packer, MD;  [facility];   Service: Urology;  Laterality: Left;  . CYSTOSCOPY WITH RETROGRADE PYELOGRAM, URETEROSCOPY AND STENT PLACEMENT Right 06/23/2013   Procedure: CYSTOSCOPY WITH RIGHT URETEROSCOPY, RIGHT  RETROGRADE PYELOGRAM, WITH LASER LIPOTRIPSY AND RIGHT URETERAL STENT EXCHANGE ;  Surgeon: Molli Hazard, MD;  [facility];  Service: Urology;  Laterality: Right;  STENT EXCHANGE    . CYSTOSCOPY WITH RETROGRADE PYELOGRAM, URETEROSCOPY AND STENT PLACEMENT Right 11/06/2013   Procedure: CYSTOSCOPY WITH RETROGRADE PYELOGRAM, URETEROSCOPY STONE REMOVAL AND STENT REMOVAL;  Surgeon: Molli Hazard, MD;  [facility];  Service: Urology;  Laterality: Right;  . HOLMIUM LASER APPLICATION N/A 7/32/2025   Procedure: HOLMIUM LASER APPLICATION;  Surgeon: Molli Hazard, MD;  [facility];  Service: Urology;  Laterality: N/A;  . INSERTION OF DIALYSIS CATHETER N/A 09/24/2013   Procedure: INSERTION OF DIALYSIS CATHETER;  Surgeon: Rosetta Posner, MD;  Location: [facility];  Service: Vascular;  Laterality: N/A;  . IR AV DIALY SHUNT INTRO NEEDLE/INTRAC INITIAL W/PTA/STENT/IMG LT Left 08/13/2018  . IR AV DIALY SHUNT INTRO NEEDLE/INTRACATH INITIAL W/PTA/IMG LEFT  04/19/2018  . IR AV DIALY SHUNT INTRO NEEDLE/INTRACATH INITIAL W/PTA/IMG LEFT  08/14/2018  . IR FLUORO GUIDE CV LINE RIGHT  09/17/2018  . IR FLUORO GUIDE CV LINE RIGHT  09/20/2018  . IR US GUIDE VASC ACCESS LEFT  04/19/2018  . IR US GUIDE VASC ACCESS LEFT  08/13/2018  . IR US GUIDE VASC ACCESS LEFT  08/14/2018  . IR US GUIDE VASC ACCESS RIGHT  09/17/2018  . PORT-A-CATH REMOVAL    . PORTACATH PLACEMENT    . REVISON OF ARTERIOVENOUS FISTULA Left 07/25/2016   Procedure: REVISON OF LEFT ARTERIOVENOUS FISTULA;  Surgeon: Elam Dutch, MD;  Location: [facility];  Service: Vascular;  Laterality: Left;  . THROMBECTOMY AND REVISION OF ARTERIOVENTOUS (AV) GORETEX  GRAFT Left 10/10/2018   Procedure: attempted of THROMBECTOMY OF ARTERIOVENTOUS FISTULA LEFT ARM;   Surgeon: Waynetta Sandy, MD;  Location: [facility];  Service: Vascular;  Laterality: Left;     A IV Location/Drains/Wounds Patient Lines/Drains/Airways Status   Active Line/Drains/Airways    Name:   Placement date:   Placement time:   Site:   Days:   Peripheral IV 03/24/19 Left;Anterior Forearm   03/24/19    1712    Forearm   less than 1   Fistula / Graft Left Upper arm Arteriovenous fistula   07/25/13    1147    Upper arm   2068   Fistula / Graft Left Upper arm Arteriovenous fistula   -    -    Upper arm      Fistula / Graft Left Upper arm Arteriovenous fistula   -    -    Upper arm      Fistula / Graft Left Upper arm Arteriovenous vein graft   10/10/18    1116    Upper arm   165   Hemodialysis Catheter Left   09/24/13    0953    Internal jugular   2007   Hemodialysis Catheter Internal jugular Double-lumen;Permanent   09/20/18  1439    Internal jugular   185   Incision 11/16/12 Perineum Other (Comment)   11/16/12    0902     2319   Incision 09/24/13 Chest Left   09/24/13    0833     2007   Incision (Closed) 07/25/16 Arm Left   07/25/16    1134     972   Incision (Closed) 09/20/18 Neck Right   09/20/18    1450     185   Incision (Closed) 09/20/18 Chest Right   09/20/18    1450     185   Incision (Closed) 10/10/18 Arm Left   10/10/18    1117     165   Pressure Ulcer 04/30/15 Stage II -  Partial thickness loss of dermis presenting as a shallow open ulcer with a red, pink wound bed without slough.   04/30/15    2358     1424   Pressure Injury 01/28/19 Unstageable - Full thickness tissue loss in which the base of the ulcer is covered by slough (yellow, tan, gray, green or brown) and/or eschar (tan, brown or black) in the wound bed.   01/28/19    1620     55   Wound 04/04/12 Perineum Mid;Upper;Lower Small, dime and nickel sized multiple open- stage II wounds.    04/04/12    1745    Perineum   2545   Wound 04/04/12 Abdomen Right;Left;Mid Small, dime sized open, stage II wounds.  Necrotic  tissue present.    04/04/12    1745    Abdomen   2545   Wound 05/24/12 Other (Comment) Perineum Right;Left perineum and buttocks hydrodenitis   05/24/12    -    Perineum   2495   Wound 08/17/12 Fissure;Excoriated (Scratch marks) Pelvis Medial moist in skin fold with sloughing and fissure formantio.  tissue is firm   08/17/12    0100    Pelvis   2410   Wound 08/17/12 Groin Right moist, sloughing, fissure formation   08/17/12    0100    Groin   2410   Wound 08/17/12 Groin Left moist, pink sloughing skin with fissure formation   08/17/12    0100    Groin   2410   Wound 08/17/12 Anus Medial sloughing, moist fissure with small ulcerations and bloody drainage from peri area to sacrum   08/17/12    0100    Anus   2410   Wound 08/17/12 Laceration Flank Left healing, closed 1cm lasceration in skin fold   08/17/12    -    Flank   2410   Wound 08/17/12 Other (Comment) Chest Medial moist, sloughing, ulcerated pink area between breasts and extending under breasts in skin fold   08/17/12    0100    Chest   2410   Wound Blister (Serous filled);Excoriated (Scratch marks) Breast Bilateral open wounds under breasts   -    -    Breast      Wound 11/24/12 Other (Comment) Perineum   11/24/12    0700    Perineum   2311   Wound 03/07/13 Other (Comment) Perineum Hidradenitis supparative   03/07/13    2100    Perineum   2208   Wound 07/27/13 Skin tear Rib Left;Lower skin tear    07/27/13    0830    Rib   2066   Wound / Incision (Open or Dehisced) 12/21/14 Other (Comment) Perineum Right;Left   12/21/14  2230    Perineum   1554   Wound / Incision (Open or Dehisced) 12/21/14  Buttocks Medial   12/21/14    2230    Buttocks   1554   Wound / Incision (Open or Dehisced) 04/30/15 Other (Comment) Perineum Right;Left multiple dime/nickel sized open/draining wounds   04/30/15    2358    Perineum   1424   Wound / Incision (Open or Dehisced) 08/25/16 Incision - Open;Other (Comment) Sacrum Right;Left   08/25/16    2130    Sacrum   941           Intake/Output Last 24 hours No intake or output data in the 24 hours ending 03/24/19 2235  Labs/Imaging Results for orders placed or performed during the hospital encounter of 03/24/19 (from the past 48 hour(s))  CBG monitoring, ED     Status: Abnormal   Collection Time: 03/24/19  4:05 PM  Result Value Ref Range   Glucose-Capillary 141 (H) 70 - 99 mg/dL  SARS Coronavirus 2 [facility] order, Performed in [facility] hospital lab)     Status: None   Collection Time: 03/24/19  4:27 PM  Result Value Ref Range   SARS Coronavirus 2 NEGATIVE NEGATIVE    Comment: (NOTE) If result is NEGATIVE SARS-CoV-2 target nucleic acids are NOT DETECTED. The SARS-CoV-2 RNA is generally detectable in upper and lower  respiratory specimens during the acute phase of infection. The lowest  concentration of SARS-CoV-2 viral copies this assay can detect is 250  copies / mL. A negative result does not preclude SARS-CoV-2 infection  and should not be used as the sole basis for treatment or other  patient management decisions.  A negative result may occur with  improper specimen collection / handling, submission of specimen other  than nasopharyngeal swab, presence of viral mutation(s) within the  areas targeted by this assay, and inadequate number of viral copies  (<250 copies / mL). A negative result must be combined with clinical  observations, patient history, and epidemiological information. If result is POSITIVE SARS-CoV-2 target nucleic acids are DETECTED. The SARS-CoV-2 RNA is generally detectable in upper and lower  respiratory specimens dur ing the acute phase of infection.  Positive  results are indicative of active infection with SARS-CoV-2.  Clinical  correlation with patient history and other diagnostic information is  necessary to determine patient infection status.  Positive results do  not rule out bacterial infection or co-infection with other viruses. If result is PRESUMPTIVE  POSTIVE SARS-CoV-2 nucleic acids MAY BE PRESENT.   A presumptive positive result was obtained on the submitted specimen  and confirmed on repeat testing.  While 2019 novel coronavirus  (SARS-CoV-2) nucleic acids may be present in the submitted sample  additional confirmatory testing may be necessary for epidemiological  and / or clinical management purposes  to differentiate between  SARS-CoV-2 and other Sarbecovirus currently known to infect humans.  If clinically indicated additional testing with an alternate test  methodology [phone]) is advised. The SARS-CoV-2 RNA is generally  detectable in upper and lower respiratory sp ecimens during the acute  phase of infection. The expected result is Negative. Fact Sheet for Patients:  StrictlyIdeas.no Fact Sheet for Healthcare Providers: BankingDealers.co.za This test is not yet approved or cleared by the [place] FDA and has been authorized for detection and/or diagnosis of SARS-CoV-2 by FDA under an Emergency Use Authorization (EUA).  This EUA will remain in effect (meaning this test can be used) for the duration of  the COVID-19 declaration under Section 564(b)(1) of the Act, 21 U.S.C. section 360bbb-3(b)(1), unless the authorization is terminated or revoked sooner. Performed at [facility], [place] [address] [place], [place] [address]   Troponin I - ONCE - STAT     Status: Abnormal   Collection Time: 03/24/19  4:56 PM  Result Value Ref Range   Troponin I 0.14 (HH) <0.03 ng/mL    Comment: CRITICAL RESULT CALLED TO, READ BACK BY AND VERIFIED WITH: K.[facility] RN [id number] 03/24/2019 MCCORMICK K Performed at [facility], [place] [address] [place], [place] [address]   CBC with Differential     Status: Abnormal   Collection Time: 03/24/19  4:56 PM  Result Value Ref Range   WBC 28.9 (H) 4.0 - 10.5 K/uL   RBC 3.35 (L) 3.87 - 5.11 MIL/uL   Hemoglobin 10.1 (L) 12.0 - 15.0 g/dL   HCT  36.1 36.0 - 46.0 %   MCV 107.8 (H) 80.0 - 100.0 fL   MCH 30.1 26.0 - 34.0 pg   MCHC 28.0 (L) 30.0 - 36.0 g/dL   RDW 16.7 (H) 11.5 - 15.5 %   Platelets 289 150 - 400 K/uL   nRBC 0.0 0.0 - 0.2 %   Neutrophils Relative % 93 %   Neutro Abs 26.9 (H) 1.7 - 7.7 K/uL   Lymphocytes Relative 3 %   Lymphs Abs 0.9 0.7 - 4.0 K/uL   Monocytes Relative 4 %   Monocytes Absolute 1.2 (H) 0.1 - 1.0 K/uL   Eosinophils Relative 0 %   Eosinophils Absolute 0.0 0.0 - 0.5 K/uL   Basophils Relative 0 %   Basophils Absolute 0.0 0.0 - 0.1 K/uL   nRBC 0 0 /100 WBC   Abs Immature Granulocytes 0.00 0.00 - 0.07 K/uL   Polychromasia PRESENT     Comment: Performed at [facility], [place] [address], [place], [place] [address]  Basic metabolic panel     Status: Abnormal   Collection Time: 03/24/19  4:56 PM  Result Value Ref Range   Sodium 133 (L) 135 - 145 mmol/L   Potassium 5.5 (H) 3.5 - 5.1 mmol/L   Chloride 99 98 - 111 mmol/L   CO2 21 (L) 22 - 32 mmol/L   Glucose, Bld 130 (H) 70 - 99 mg/dL   BUN 90 (H) 6 - 20 mg/dL   Creatinine, Ser 12.52 (H) 0.44 - 1.00 mg/dL   Calcium 9.3 8.9 - 10.3 mg/dL   GFR calc non Af Amer 3 (L) >60 mL/min   GFR calc Af Amer 3 (L) >60 mL/min   Anion gap 13 5 - 15    Comment: Performed at [facility] [address], [place],  [address]  Brain natriuretic peptide     Status: Abnormal   Collection Time: 03/24/19  4:56 PM  Result Value Ref Range   B Natriuretic Peptide 277.2 (H) 0.0 - 100.0 pg/mL    Comment: Performed at [facility] [address] [place], [place] [address]  Lactic acid, plasma     Status: None   Collection Time: 03/24/19  4:56 PM  Result Value Ref Range   Lactic Acid, Venous 1.0 0.5 - 1.9 mmol/L    Comment: Performed at [facility] [address] [place],  [address]  Troponin I - ONCE - STAT     Status: Abnormal   Collection Time: 03/24/19  7:30 PM  Result Value Ref Range  Troponin I 0.19 (HH) <0.03 ng/mL    Comment:  CRITICAL VALUE NOTED.  VALUE IS CONSISTENT WITH PREVIOUSLY REPORTED AND CALLED VALUE. Performed at [facility], [place] [address] [place], [place] [address]    Dg Chest Port 1 View  Result Date: 03/24/2019 CLINICAL DATA:  58 year old female with shortness of breath EXAM: PORTABLE CHEST 1 VIEW COMPARISON:  01/29/2019, 01/27/2019, 09/18/2018 FINDINGS: Cardiomediastinal silhouette likely unchanged. Right rotation of the patient. Unchanged right IJ hemodialysis catheter. No pneumothorax.  No large pleural effusion. Low lung volumes. Similar appearance of thickening of the minor fissure. No interlobular septal thickening. Hazy opacity on the left is related to the chest wall soft tissues and right rotation. IMPRESSION: No evidence of acute cardiopulmonary disease with low lung volumes and unchanged right IJ hemodialysis catheter Electronically Signed   By: Corrie Mckusick D.O.   On: 03/24/2019 18:41    Pending Labs Unresulted Labs (From admission, onward)    Start     Ordered   03/24/19 1544  Blood culture (routine x 2)  BLOOD CULTURE X 2,   STAT     03/24/19 1543   03/24/19 1544  Lactic acid, plasma  Now then every 2 hours,   STAT     03/24/19 1543          Vitals/Pain Today's Vitals   03/24/19 2000 03/24/19 2100 03/24/19 2130 03/24/19 2230  BP: 128/74 (!) 91/53 (!) 106/44 108/62  Pulse: 87   85  Resp: 18 (!) 28 (!) 28 (!) 21  Temp:      TempSrc:      SpO2: 100% 98% 98% 100%  Height:      PainSc:        Isolation Precautions Droplet and Contact precautions  Medications Medications  aspirin chewable tablet 324 mg (324 mg Oral Given 03/24/19 2115)    Mobility walks Low fall risk   Focused Assessments Cardiac Assessment Handoff:  Cardiac Rhythm: Normal sinus rhythm Lab Results  Component Value Date   CKTOTAL 101 05/22/2012   CKMB 3.5 05/22/2012   TROPONINI 0.19 (HH) 03/24/2019   Lab Results  Component Value Date   DDIMER (H) 07/11/2007    1.11        AT THE  INHOUSE ESTABLISHED CUTOFF VALUE OF 0.48 ug/mL FEU, THIS ASSAY HAS BEEN DOCUMENTED IN THE LITERATURE TO HAVE   Does the Patient currently have chest pain? No  , Pulmonary Assessment Handoff:  Lung sounds: L Breath Sounds: Clear R Breath Sounds: Clear O2 Device: Nasal Cannula O2 Flow Rate (L/min): 2 L/min      R Recommendations: See Admitting Provider Note  Report given to:   Additional Notes: wears 2L  at night

## 2019-03-24 NOTE — ED Notes (Signed)
Was able to obtain only one set of Blood cultures

## 2019-03-24 NOTE — H&P (Signed)
TRH H&P    Patient Demographics:    [name], is a 58 y.o. female  MRN: [id number]  DOB - 02/04/1961  Admit Date - 03/24/2019  Referring MD/NP/PA:   Dr. Tyrone Nine  Outpatient Primary MD for the patient is Harlan Stains, MD Nephrology- [facility]  Patient coming from:  home  Chief complaint- dyspnea   HPI:    [name]  is a 58 y.o. female,w hypertension, hyperlipidemia, Dm2, , ESRD on HD (M,W,F),Pafib, CHF (diastolic) apparently presents w c/o dyspnea for the past 1-2 days.  Pt notes slight dry cough, and fever to 101,  ? Slight orthopnea, but unclear about weight change.  Pt denies cp, palp, n/v, abd pain, diarrhea, brbpr, black stool.  Pt missed her dialysis today due to coming to ER for evaluation of dyspnea.    In ED,  T 99 P 85  R 21, Bp 108/62  Pox  85-100% Wt   Covid negative Trop 0.05=> 0.14  Wbc 28.9 (prev 20.7), Hgb 10.1, Plt 289 Na 133, K 5.5, Bun 90, Creatinine 12.52 Hco3 21 BNP 277.2  (prev 250.6 and 493.1) Lactic acid 1.0  CXR IMPRESSION: No evidence of acute cardiopulmonary disease with low lung volumes and unchanged right IJ hemodialysis catheter  EKG nsr at 90, nl axis, nl int, poor R progression, slight st depression in v1-3  Pt will be admitted for evaluation of dyspnea and troponin elevation.     Review of systems:    In addition to the HPI above,   No Headache, No changes with Vision or hearing, No problems swallowing food or Liquids, No Chest pain,  No Abdominal pain, No Nausea or Vomiting, bowel movements are regular, No Blood in stool or Urine, No dysuria, No new skin rashes or bruises, No new joints pains-aches,  No new weakness, tingling, numbness in any extremity, No recent weight gain or loss, No polyuria, polydypsia or polyphagia, No significant Mental Stressors.  All other systems reviewed and are negative.    Past History of the following  :    Past Medical History:  Diagnosis Date   Chronic diastolic CHF (congestive heart failure) (HCC)    DCM (dilated cardiomyopathy) ([place])    a. presumed nonischemic - no prior cath/nuc. EF 35-40% in 2011, most recently  60-65% by echo 2017.   Depression    DM type 2 (diabetes mellitus, type 2) ([place])    diet controlledwith retinopathy and nephropathy   ESRD on hemodialysis ([place]) 08/21/2017   GERD (gastroesophageal reflux disease)    H/O cardiac arrest 10/2012   Hard of hearing 10/2012   now wears 2 hearing aids   History of hemodialysis dec 2013   HTN (hypertension)    Hydradenitis    Hyperkalemia    Hyperlipidemia    Hypothyroidism    Iron deficiency anemia    Morbid obesity ([place])    Multinodular goiter    Neuropathic pain 08/21/2017   Orthostatic hypotension 06/27/2018   PAF (paroxysmal atrial fibrillation) (HCC)    Paroxysmal atrial flutter ([place])  Pneumonia Nov 10, 2012   Sleep apnea    Intolerant to CPAP   Tinnitus 12/26/2016   Typical atrial flutter ([place]) 04/10/2017   Urinary tract infection    taking antibiotics for 3 days prior to surgery      Past Surgical History:  Procedure Laterality Date   AV FISTULA PLACEMENT Left 07/25/2013   Procedure: ARTERIOVENOUS (AV) FISTULA CREATION ;  Surgeon: Rosetta Posner, MD;  Location: [facility];  Service: Vascular;  Laterality: Left;   AV FISTULA PLACEMENT Left 10/10/2018   Procedure: INSERTION OF LEFT UPPER ARM brachial to axillary artery ARTERIOVENOUS (AV) GORE-TEX GRAFT using 4-7 stretch graft;  Surgeon: Waynetta Sandy, MD;  Location: [facility];  Service: Vascular;  Laterality: Left;   CYSTOSCOPY W/ RETROGRADES  11/16/2012   Procedure: CYSTOSCOPY WITH RETROGRADE PYELOGRAM;  Surgeon: Reece Packer, MD;  [facility];  Service: Urology;  Laterality: Bilateral;  CYSTOSCOPY,BILATERAL RETROGRADE PYELOGRAM/ REMOVAL LEFT URETERAL STENT/ FULGERATION BLADDER MUCOSA/ INSERTION RIGHT URETERAL STENT    CYSTOSCOPY W/ URETERAL STENT PLACEMENT  05/28/2012   Procedure: CYSTOSCOPY WITH RETROGRADE PYELOGRAM/URETERAL STENT PLACEMENT;  Surgeon: Reece Packer, MD;  [facility];  Service: Urology;  Laterality: Left;   CYSTOSCOPY WITH RETROGRADE PYELOGRAM, URETEROSCOPY AND STENT PLACEMENT Right 06/23/2013   Procedure: CYSTOSCOPY WITH RIGHT URETEROSCOPY, RIGHT  RETROGRADE PYELOGRAM, WITH LASER LIPOTRIPSY AND RIGHT URETERAL STENT EXCHANGE ;  Surgeon: Molli Hazard, MD;  [facility];  Service: Urology;  Laterality: Right;  STENT EXCHANGE     CYSTOSCOPY WITH RETROGRADE PYELOGRAM, URETEROSCOPY AND STENT PLACEMENT Right 11/06/2013   Procedure: CYSTOSCOPY WITH RETROGRADE PYELOGRAM, URETEROSCOPY STONE REMOVAL AND STENT REMOVAL;  Surgeon: Molli Hazard, MD;  [facility];  Service: Urology;  Laterality: Right;   HOLMIUM LASER APPLICATION N/A 4/82/5003   Procedure: HOLMIUM LASER APPLICATION;  Surgeon: Molli Hazard, MD;  [facility];  Service: Urology;  Laterality: N/A;   INSERTION OF DIALYSIS CATHETER N/A 09/24/2013   Procedure: INSERTION OF DIALYSIS CATHETER;  Surgeon: Rosetta Posner, MD;  Location: [facility];  Service: Vascular;  Laterality: N/A;   IR AV DIALY SHUNT INTRO NEEDLE/INTRAC INITIAL W/PTA/STENT/IMG LT Left 08/13/2018   IR AV DIALY SHUNT INTRO NEEDLE/INTRACATH INITIAL W/PTA/IMG LEFT  04/19/2018   IR AV DIALY SHUNT INTRO NEEDLE/INTRACATH INITIAL W/PTA/IMG LEFT  08/14/2018   IR FLUORO GUIDE CV LINE RIGHT  09/17/2018   IR FLUORO GUIDE CV LINE RIGHT  09/20/2018   IR US GUIDE VASC ACCESS LEFT  04/19/2018   IR US GUIDE VASC ACCESS LEFT  08/13/2018   IR US GUIDE VASC ACCESS LEFT  08/14/2018   IR US GUIDE VASC ACCESS RIGHT  09/17/2018   PORT-A-CATH REMOVAL     PORTACATH PLACEMENT     REVISON OF ARTERIOVENOUS FISTULA Left 07/25/2016   Procedure: REVISON OF LEFT ARTERIOVENOUS FISTULA;  Surgeon: Elam Dutch, MD;  [facility];  Service: Vascular;   Laterality: Left;   THROMBECTOMY AND REVISION OF ARTERIOVENTOUS (AV) GORETEX  GRAFT Left 10/10/2018   Procedure: attempted of THROMBECTOMY OF ARTERIOVENTOUS FISTULA LEFT ARM;  Surgeon: Waynetta Sandy, MD;  Location: [facility];  Service: Vascular;  Laterality: Left;      Social History:      Social History   Tobacco Use   Smoking status: Former Smoker    Packs/day: 0.25    Years: 1.00    Pack years: 0.25    Types: Cigarettes    Last attempt to quit: 11/28/1979  Years since quitting: 39.3  °• Smokeless tobacco: Never Used  °Substance Use Topics  °• Alcohol use: No  °  ° ° ° Family History :  ° °  °Family History  °Problem Relation Age of Onset  °• Coronary artery disease Mother   °• Hypertension Mother   °• Diabetes type II Mother   °• Coronary artery disease Father   °• Hypertension Father   °• Malignant hyperthermia Father   °• Cancer Maternal Grandfather   °     ? Type  °• Kidney failure Maternal Grandmother   ° °  ° ° Home Medications:  ° °Prior to Admission medications   °Medication Sig Start Date End Date Taking? Authorizing Provider  °acetaminophen (TYLENOL) 500 MG tablet Take 1,000 mg by mouth daily as needed for moderate pain.   Yes [provider]  °aspirin EC 81 MG tablet Take 81 mg by mouth at bedtime.    Yes [provider]  °B Complex-C-Folic Acid (NEPHRO-VITE PO) Take 1 tablet by mouth daily.   Yes [provider]  °Calcium Acetate 667 MG TABS Take 667 mg by mouth See admin instructions. Take 667 mg by mouth three times a day with meals and 667 mg with each snack   Yes [provider]  °Dakins (SODIUM HYPOCHLORITE) 0.25 % SOLN Apply 1 application topically See admin instructions. Apply to wounds as directed 2 times daily   Yes [provider]  °DULoxetine (CYMBALTA) 60 MG capsule Take 1 capsule (60 mg total) by mouth daily. 12/03/12  Yes Short, Mackenzie, MD  °famotidine (PEPCID) 20 MG tablet TAKE 1 TABLET BY MOUTH AT  BEDTIME °Patient taking differently: Take 20 mg by mouth at bedtime.  10/15/14  Yes Wert, Michael B, MD  °glipiZIDE (GLUCOTROL) 5 MG tablet Take 0.5 tablets (2.5 mg total) by mouth daily. 08/31/16  Yes Rizwan, Saima, MD  °levothyroxine (SYNTHROID, LEVOTHROID) 75 MCG tablet Take 75 mcg by mouth daily before breakfast.  08/17/15  Yes [provider]  °midodrine (PROAMATINE) 10 MG tablet Take 1 tablet (10 mg total) by mouth 3 (three) times daily. °Patient taking differently: Take 10 mg by mouth See admin instructions. Take 10 mg by mouth in the morning on Mon/Wed/Fri ONLY- before dialysis 08/31/16  Yes Rizwan, Saima, MD  °Multiple Vitamin (MULTIVITAMIN WITH MINERALS) TABS tablet Take 1 tablet by mouth daily.   Yes [provider]  °oxyCODONE-acetaminophen (PERCOCET/ROXICET) 5-325 MG tablet Take 1-2 tablets by mouth every 6 (six) hours as needed. Patient has not taken since started dialysis 08/2013 due to causes hypotension °Patient taking differently: Take 2 tablets by mouth every 6 (six) hours as needed for moderate pain.  07/25/16  Yes Trinh, Kimberly A, PA-C  °pravastatin (PRAVACHOL) 40 MG tablet Take 40 mg by mouth every evening.  12/03/12  Yes Short, Mackenzie, MD  °Probiotic Product (PROBIOTIC DAILY PO) Take 1 capsule by mouth 2 (two) times daily.    Yes [provider]  °sevelamer carbonate (RENVELA) 800 MG tablet Take 2,400 mg by mouth 3 (three) times daily with meals.   Yes [provider]  °diltiazem (CARDIZEM) 30 MG tablet Take 1 tablet every 4 hours AS NEEDED for rapid heart rate lasting more than 30 mins as long as top blood pressure >100. °Patient taking differently: Take 30 mg by mouth every 4 (four) hours as needed. Take 30 mg by mouth every four hours as needed for rapid heart rate lasting more than 30 minutes, as long as Systolic   number ("top blood pressure") is >100 03/09/17   Sherran Needs, NP  diphenhydrAMINE (BENADRYL) 25 MG tablet Take 25 mg by mouth daily as  needed for allergies.    [provider]  feeding supplement, RESOURCE BREEZE, (RESOURCE BREEZE) LIQD Take 1 Container by mouth daily as needed (for meal supplementation).     [provider]  metroNIDAZOLE (FLAGYL) 500 MG tablet Take 1 tablet (500 mg total) by mouth every 8 (eight) hours. 01/31/19   Ghimire, Henreitta Leber, MD  sucroferric oxyhydroxide (VELPHORO) 500 MG chewable tablet Chew 500-1,000 mg by mouth See admin instructions. Chew 1,000 mg by mouth with each meal and 500 mg with each snack    [provider]     Allergies:     Allergies  Allergen Reactions   Rosiglitazone Other (See Comments)    "Avandia" = Reaction not recalled   Amoxicillin Rash and Other (See Comments)    Tolerated Zosyn 01/2013. [place] Has patient had a PCN reaction causing immediate rash, facial/tongue/throat swelling, SOB or lightheadedness with hypotension: Yes Has patient had a PCN reaction causing severe rash involving mucus membranes or skin necrosis: No Has patient had a PCN reaction that required hospitalization: No Has patient had a PCN reaction occurring within the last 10 years: No If all of the above answers are "NO", then may proceed with Cephalosporin use.    Amoxicillin-Pot Clavulanate Diarrhea     Physical Exam:   Vitals  Blood pressure 108/62, pulse 85, temperature 99 F (37.2 C), temperature source Oral, resp. rate (!) 21, height 5' 6.5" (1.689 m), last menstrual period 06/13/2013, SpO2 100 %.  1.  General: axox3  2. Psychiatric: euthymic  3. Neurologic: cn2-12 intact, reflexes 2+ symmetric, diffuse , no clonus, motor 5/5 in all 4 ext  4. HEENMT:  Anicteric, pupils 1.47m symmetric, direct, consensual, near intact  5. Respiratory : Slight decrease in bs at bases, no crackles, no wheezing  6. Cardiovascular : rrr s1, s2, no m/g/r  7. Gastrointestinal:  Abd: obese, soft, nt, nd, +bs  8. Skin:  Ext: no c/c/e, no rash  9.Musculoskeletal:   Good ROM  No adenopathy    Data Review:    CBC Recent Labs  Lab 03/24/19 1656  WBC 28.9*  HGB 10.1*  HCT 36.1  PLT 289  MCV 107.8*  MCH 30.1  MCHC 28.0*  RDW 16.7*  LYMPHSABS 0.9  MONOABS 1.2*  EOSABS 0.0  BASOSABS 0.0   ------------------------------------------------------------------------------------------------------------------  Results for orders placed or performed during the hospital encounter of 03/24/19 (from the past 48 hour(s))  CBG monitoring, ED     Status: Abnormal   Collection Time: 03/24/19  4:05 PM  Result Value Ref Range   Glucose-Capillary 141 (H) 70 - 99 mg/dL  S[facility] Coronavirus 2 [facility]     Status: None   Collection Time: 03/24/19  4:27 PM  Result Value Ref Range   SARS Coronavirus 2 NEGATIVE NEGATIVE    Comment: (NOTE) If result is NEGATIVE SARS-CoV-2 target nucleic acids are NOT DETECTED. The SARS-CoV-2 RNA is generally detectable in upper and lower  respiratory specimens during the acute phase of infection. The lowest  concentration of SARS-CoV-2 viral copies this assay can detect is 250  copies / mL. A negative result does not preclude SARS-CoV-2 infection  and should not be used as the sole basis for treatment or other  patient management decisions.  A negative result may occur with  improper specimen collection / handling, submission of specimen other  °than nasopharyngeal swab, presence of viral mutation(s) within the  °areas targeted by this assay, and inadequate number of viral copies  °(<250 copies / mL). A negative result must be combined with clinical  °observations, patient history, and epidemiological information. °If result is POSITIVE °SARS-CoV-2 target nucleic acids are DETECTED. °The SARS-CoV-2 RNA is generally detectable in upper and lower  °respiratory specimens dur °ing the acute phase of infection.  Positive  °results are indicative of active infection with SARS-CoV-2.  Clinical   °correlation with patient history and other diagnostic information is  °necessary to determine patient infection status.  Positive results do  °not rule out bacterial infection or co-infection with other viruses. °If result is PRESUMPTIVE POSTIVE °SARS-CoV-2 nucleic acids MAY BE PRESENT.   °A presumptive positive result was obtained on the submitted specimen  °and confirmed on repeat testing.  While 2019 novel coronavirus  °(SARS-CoV-2) nucleic acids may be present in the submitted sample  °additional confirmatory testing may be necessary for epidemiological  °and / or clinical management purposes  to differentiate between  °SARS-CoV-2 and other Sarbecovirus currently known to infect humans.  °If clinically indicated additional testing with an alternate test  °methodology ([id number]) is advised. The SARS-CoV-2 RNA is generally  °detectable in upper and lower respiratory sp °ecimens during the acute  °phase of infection. °The expected result is Negative. °Fact Sheet for Patients:  https://www.fda.gov/media/136312/download °Fact Sheet for Healthcare Providers: °https://www.fda.gov/media/136313/download °This test is not yet approved or cleared by the [place] FDA and °has been authorized for detection and/or diagnosis of SARS-CoV-2 by °FDA under an Emergency Use Authorization (EUA).  This EUA will remain °in effect (meaning this test can be used) for the duration of the °COVID-19 declaration under Section 564(b)(1) of the Act, 21 U.S.C. °section 360bbb-3(b)(1), unless the authorization is terminated or °revoked sooner. °Performed at [facility], [address] [place], [place] °[address] °  °Troponin I - ONCE - STAT     Status: Abnormal  ° Collection Time: 03/24/19  4:56 PM  °Result Value Ref Range  ° Troponin I 0.14 (HH) <0.03 ng/mL  °  Comment: CRITICAL RESULT CALLED TO, READ BACK BY AND VERIFIED WITH: °K.KIRK RN 1883 03/24/2019 MCCORMICK K °Performed at [facility], [address]  [place], [place] [address] °  °CBC with Differential     Status: Abnormal  ° Collection Time: 03/24/19  4:56 PM  °Result Value Ref Range  ° WBC 28.9 (H) 4.0 - 10.5 K/uL  ° RBC 3.35 (L) 3.87 - 5.11 MIL/uL  ° Hemoglobin 10.1 (L) 12.0 - 15.0 g/dL  ° HCT 36.1 36.0 - 46.0 %  ° MCV 107.8 (H) 80.0 - 100.0 fL  ° MCH 30.1 26.0 - 34.0 pg  ° MCHC 28.0 (L) 30.0 - 36.0 g/dL  ° RDW 16.7 (H) 11.5 - 15.5 %  ° Platelets 289 150 - 400 K/uL  ° nRBC 0.0 0.0 - 0.2 %  ° Neutrophils Relative % 93 %  ° Neutro Abs 26.9 (H) 1.7 - 7.7 K/uL  ° Lymphocytes Relative 3 %  ° Lymphs Abs 0.9 0.7 - 4.0 K/uL  ° Monocytes Relative 4 %  ° Monocytes Absolute 1.2 (H) 0.1 - 1.0 K/uL  ° Eosinophils Relative 0 %  ° Eosinophils Absolute 0.0 0.0 - 0.5 K/uL  ° Basophils Relative 0 %  ° Basophils Absolute 0.0 0.0 - 0.1 K/uL  ° nRBC 0 0 /100   WBC  ° Abs Immature Granulocytes 0.00 0.00 - 0.07 K/uL  ° Polychromasia PRESENT   °  Comment: Performed at [facility], [address] [place], [place] [address]  °Basic metabolic panel     Status: Abnormal  ° Collection Time: 03/24/19  4:56 PM  °Result Value Ref Range  ° Sodium 133 (L) 135 - 145 mmol/L  ° Potassium 5.5 (H) 3.5 - 5.1 mmol/L  ° Chloride 99 98 - 111 mmol/L  ° CO2 21 (L) 22 - 32 mmol/L  ° Glucose, Bld 130 (H) 70 - 99 mg/dL  ° BUN 90 (H) 6 - 20 mg/dL  ° Creatinine, Ser 12.52 (H) 0.44 - 1.00 mg/dL  ° Calcium 9.3 8.9 - 10.3 mg/dL  ° GFR calc non Af Amer 3 (L) >60 mL/min  ° GFR calc Af Amer 3 (L) >60 mL/min  ° Anion gap 13 5 - 15  °  Comment: Performed at [facility], [address] [place], [place] [address]  °Brain natriuretic peptide     Status: Abnormal  ° Collection Time: 03/24/19  4:56 PM  °Result Value Ref Range  ° B Natriuretic Peptide 277.2 (H) 0.0 - 100.0 pg/mL  °  Comment: Performed at [facility], [address] [place], [place] [address]  °Lactic acid, plasma     Status: None  ° Collection Time: 03/24/19  4:56 PM  °Result Value Ref Range  ° Lactic Acid, Venous 1.0 0.5 - 1.9 mmol/L  °   Comment: Performed at [facility], [address] [place], [place] [address]  °Troponin I - ONCE - STAT     Status: Abnormal  ° Collection Time: 03/24/19  7:30 PM  °Result Value Ref Range  ° Troponin I 0.19 (HH) <0.03 ng/mL  °  Comment: CRITICAL VALUE NOTED.  VALUE IS CONSISTENT WITH PREVIOUSLY REPORTED AND CALLED VALUE. °Performed at  Hospital Lab, [address] [place],  [address] °  ° ° °Chemistries  °Recent Labs  °Lab 03/24/19 °1656  °NA 133*  °K 5.5*  °CL 99  °CO2 21*  °GLUCOSE 130*  °BUN 90*  °CREATININE 12.52*  °CALCIUM 9.3  ° °------------------------------------------------------------------------------------------------------------------ ° °------------------------------------------------------------------------------------------------------------------ °GFR: °CrCl cannot be calculated (Unknown ideal weight.). °Liver Function Tests: °No results for input(s): AST, ALT, ALKPHOS, BILITOT, PROT, ALBUMIN in the last 168 hours. °No results for input(s): LIPASE, AMYLASE in the last 168 hours. °No results for input(s): AMMONIA in the last 168 hours. °Coagulation Profile: °No results for input(s): INR, PROTIME in the last 168 hours. °Cardiac Enzymes: °Recent Labs  °Lab 03/24/19 °1656 03/24/19 °1930  °TROPONINI 0.14* 0.19*  ° °BNP (last 3 results) °No results for input(s): PROBNP in the last 8760 hours. °HbA1C: °No results for input(s): HGBA1C in the last 72 hours. °CBG: °Recent Labs  °Lab 03/24/19 °1605  °GLUCAP 141*  ° °Lipid Profile: °No results for input(s): CHOL, HDL, LDLCALC, TRIG, CHOLHDL, LDLDIRECT in the last 72 hours. °Thyroid Function Tests: °No results for input(s): TSH, T4TOTAL, FREET4, T3FREE, THYROIDAB in the last 72 hours. °Anemia Panel: °No results for input(s): VITAMINB12, FOLATE, FERRITIN, TIBC, IRON, RETICCTPCT in the last 72 hours. ° °--------------------------------------------------------------------------------------------------------------- °Urine analysis: °    °Component Value Date/Time  ° COLORURINE YELLOW 07/18/2013 0831  ° APPEARANCEUR CLOUDY (A) 07/18/2013 0831  ° LABSPEC 1.012 07/18/2013 0831  ° PHURINE 5.0 07/18/2013 0831  ° GLUCOSEU NEGATIVE 07/18/2013 0831  ° HGBUR SMALL (A) 07/18/2013 0831  ° BILIRUBINUR NEGATIVE 07/18/2013 0831  °   KETONESUR NEGATIVE 07/18/2013 0831   PROTEINUR 30 (A) 07/18/2013 0831   UROBILINOGEN 0.2 07/18/2013 0831   NITRITE NEGATIVE 07/18/2013 0831   LEUKOCYTESUR MODERATE (A) 07/18/2013 0831      Imaging Results:    Dg Chest Port 1 View  Result Date: 03/24/2019 CLINICAL DATA:  58 year old female with shortness of breath EXAM: PORTABLE CHEST 1 VIEW COMPARISON:  01/29/2019, 01/27/2019, 09/18/2018 FINDINGS: Cardiomediastinal silhouette likely unchanged. Right rotation of the patient. Unchanged right IJ hemodialysis catheter. No pneumothorax.  No large pleural effusion. Low lung volumes. Similar appearance of thickening of the minor fissure. No interlobular septal thickening. Hazy opacity on the left is related to the chest wall soft tissues and right rotation. IMPRESSION: No evidence of acute cardiopulmonary disease with low lung volumes and unchanged right IJ hemodialysis catheter Electronically Signed   By: Corrie Mckusick D.O.   On: 03/24/2019 18:41       Assessment & Plan:    Principal Problem:   Dyspnea Active Problems:   DM type 2 (diabetes mellitus, type 2) (HCC)   OSA (obstructive sleep apnea)   COPD (chronic obstructive pulmonary disease) (HCC)   ESRD (end stage renal disease) on dialysis (HCC)   Elevated troponin  Dyspnea, unclear etiology ddx Copd, fluid overload secondary to not having HD today, ischemic heart disease Tele Repeat CXR in am  Leukocytosis, unclear etiology Check blood culture x2 Check ESR Check CT chest w/o contrast r/o occult pneumonia Start on empiric vanco iv, cefepime iv pharmacy to dose  ESRD on HD (M, W, F) Cont Calcium acetate/ Renvela ? Unclear if taking both, please review  with patient in AM Please contact nephrology in AM to arrange HD  Mild Hyperkalemia w/o ekg changes Calcium gluconate Sodium bicarbonate Kayexalate Check cmp in am  Elevated troponin Check trop I q6x3 Check Cpk, mb Check cardiac echo Cardiology was contacted by ED, recommended to cycle cardiac markers as well as obtain echo Please contact cardiology for formal consult in AM.   Dm2 Cont Glucotrol fsbs ac and qhs, ISS  Hypertension/ h/o CHF (diastolic), Pafib Cont Aspirin Cont Cardizem  Hidradenitis supprativa Cont Dakins  Hypothyroidism Cont Levothyroxine 75 micrograms po qday  Hyperlipidemia Cont Pravastatin 55m po qhs      DVT Prophylaxis-   heparin- SCDs   AM Labs Ordered, also please review Full Orders  Family Communication: Admission, patients condition and plan of care including tests being ordered have been discussed with the patient  who indicate understanding and agree with the plan and Code Status.  Code Status:  FULL CODE  Admission status: Observation: Based on patients clinical presentation and evaluation of above clinical data, I have made determination that patient meets Observation criteria at this time.  Pt will require iv abx for leukocytosis ? Occult pneumonia, and needs HD for hyperkalemia, and ESRD. Pt may require inpatient status depending upon results of elevation in troponin w/up   Time spent in minutes : 70   JJani GravelM.D on 03/24/2019 at 11:24 PM

## 2019-03-24 NOTE — ED Notes (Signed)
IV team at bedside 

## 2019-03-24 NOTE — ED Triage Notes (Signed)
Pt states she had a fever 99-101 at home. Pt states she had SOB this morning & states right now she feels fine. Pt states she took tylenol 1000mg .

## 2019-03-24 NOTE — Progress Notes (Signed)
VAST consulted to place IV and draw blood.  Pt's right lower arm did not have any vessels that could be used for IV access, assessing with [place]. Upper right arm vessels were too deep. Pt's upper left arm has an old graft in it that is no longer working. Therefore, when appropriate vein in lower left arm was visualized with [place], it was used to place USGIV.

## 2019-03-24 NOTE — ED Notes (Signed)
Difficulty with getting blood/ IV access IV team consulted

## 2019-03-24 NOTE — ED Provider Notes (Signed)
[facility] EMERGENCY DEPARTMENT Provider Note   CSN: [id number] Arrival date & time: 03/24/19  1419    History   Chief Complaint Chief Complaint  Patient presents with  . Fever  . Shortness of Breath    HPI [name] is a 58 y.o. female.     58 yo F with a chief complaint of shortness of breath and fever.  This been going on since yesterday.  States that she took a longer than normal nap and woke up a bit disoriented and felt warm.  Had checked her temperature off and on and the highest was 101.  Denies abdominal pain denies vomiting denies diarrhea denies cough.  Has chronic congestion that she feels is unchanged.  Is end-stage renal disease gets dialysis Monday Wednesday Friday missed today because she did not feel well.  Was having shortness of breath worse on laying back flat.  She thinks that this is gotten significantly better.  She is normally on 2 L of oxygen for when she is sleeping though she is started to wear this consistently over the past 2 days.  The history is provided by the patient.  Fever  Associated symptoms: congestion (chronic)   Associated symptoms: no chest pain, no chills, no cough, no dysuria, no headaches, no myalgias, no nausea, no rhinorrhea and no vomiting   Shortness of Breath  Associated symptoms: fever   Associated symptoms: no chest pain, no cough, no headaches, no vomiting and no wheezing   Illness  Severity:  Moderate Onset quality:  Sudden Duration:  2 days Timing:  Constant Progression:  Worsening Chronicity:  New Associated symptoms: congestion (chronic), fever and shortness of breath   Associated symptoms: no chest pain, no cough, no headaches, no myalgias, no nausea, no rhinorrhea, no vomiting and no wheezing     Past Medical History:  Diagnosis Date  . Chronic diastolic CHF (congestive heart failure) ([place])   . DCM (dilated cardiomyopathy) ([place])    a. presumed nonischemic - no prior cath/nuc. EF 35-40% in  2011, most recently  60-65% by echo 2017.  Marland Kitchen Depression   . DM type 2 (diabetes mellitus, type 2) (HCC)    diet controlledwith retinopathy and nephropathy  . ESRD on hemodialysis ([place]) 08/21/2017  . GERD (gastroesophageal reflux disease)   . H/O cardiac arrest 10/2012  . Hard of hearing 10/2012   now wears 2 hearing aids  . History of hemodialysis dec 2013  . HTN (hypertension)   . Hydradenitis   . Hyperkalemia   . Hyperlipidemia   . Hypothyroidism   . Iron deficiency anemia   . Morbid obesity ([place])   . Multinodular goiter   . Neuropathic pain 08/21/2017  . Orthostatic hypotension 06/27/2018  . PAF (paroxysmal atrial fibrillation) ([place])   . Paroxysmal atrial flutter ([place])   . Pneumonia Nov 10, 2012  . Sleep apnea    Intolerant to CPAP  . Tinnitus 12/26/2016  . Typical atrial flutter ([place]) 04/10/2017  . Urinary tract infection    taking antibiotics for 3 days prior to surgery    Patient Active Problem List   Diagnosis Date Noted  . Elevated troponin 03/24/2019  . Sepsis ([place]) 01/28/2019  . Acute on chronic respiratory failure with hypoxia ([place]) 01/27/2019  . Acute encephalopathy 01/27/2019  . Anemia 09/17/2018  . Orthostatic hypotension 06/27/2018  . Neuropathic pain 08/21/2017  . Typical atrial flutter ([place]) 04/10/2017  . Persistent atrial fibrillation 02/04/2017  . Tinnitus 12/26/2016  . Streptococcal  bacteremia   . ESRD (end stage renal disease) on dialysis ([place]) 08/25/2016  . Absolute anemia   . Pressure ulcer 05/01/2015  . Chronic respiratory failure ([place]) 11/10/2013  . COPD (chronic obstructive pulmonary disease) ([place]) 11/06/2013  . DCM (dilated cardiomyopathy) ([place])   . OSA (obstructive sleep apnea) 07/17/2013  . Chronic diastolic heart failure ([place]) 07/16/2013  . Dyspnea 07/11/2013  . Hypertension 11/30/2012  . Severe sepsis with acute organ dysfunction ([place]) 11/10/2012  . Hydronephrosis of left kidney 05/28/2012  . Nephrolithiasis 05/28/2012  . Hidradenitis  suppurativa 05/21/2012  . Symptomatic anemia 05/21/2012  . DM type 2 (diabetes mellitus, type 2) ([place])   . Mood disorder in conditions classified elsewhere 09/06/2010  . ERYTHEMA NODOSUM, HX OF 07/27/2010  . GOITER, MULTINODULAR 05/18/2010  . Morbid obesity ([place]) 05/18/2010  . GERD 05/18/2010    Past Surgical History:  Procedure Laterality Date  . AV FISTULA PLACEMENT Left 07/25/2013   Procedure: ARTERIOVENOUS (AV) FISTULA CREATION ;  Surgeon: Rosetta Posner, MD;  Location: [facility];  Service: Vascular;  Laterality: Left;  . AV FISTULA PLACEMENT Left 10/10/2018   Procedure: INSERTION OF LEFT UPPER ARM brachial to axillary artery ARTERIOVENOUS (AV) GORE-TEX GRAFT using 4-7 stretch graft;  Surgeon: Waynetta Sandy, MD;  Location: [facility];  Service: Vascular;  Laterality: Left;  . CYSTOSCOPY W/ RETROGRADES  11/16/2012   Procedure: CYSTOSCOPY WITH RETROGRADE PYELOGRAM;  Surgeon: Reece Packer, MD;  Location: [facility];  Service: Urology;  Laterality: Bilateral;  CYSTOSCOPY,BILATERAL RETROGRADE PYELOGRAM/ REMOVAL LEFT URETERAL STENT/ FULGERATION BLADDER MUCOSA/ INSERTION RIGHT URETERAL STENT  . CYSTOSCOPY W/ URETERAL STENT PLACEMENT  05/28/2012   Procedure: CYSTOSCOPY WITH RETROGRADE PYELOGRAM/URETERAL STENT PLACEMENT;  Surgeon: Reece Packer, MD;  Location: [facility];  Service: Urology;  Laterality: Left;  . CYSTOSCOPY WITH RETROGRADE PYELOGRAM, URETEROSCOPY AND STENT PLACEMENT Right 06/23/2013   Procedure: CYSTOSCOPY WITH RIGHT URETEROSCOPY, RIGHT  RETROGRADE PYELOGRAM, WITH LASER LIPOTRIPSY AND RIGHT URETERAL STENT EXCHANGE ;  Surgeon: Molli Hazard, MD;  Location: [facility];  Service: Urology;  Laterality: Right;  STENT EXCHANGE    . CYSTOSCOPY WITH RETROGRADE PYELOGRAM, URETEROSCOPY AND STENT PLACEMENT Right 11/06/2013   Procedure: CYSTOSCOPY WITH RETROGRADE PYELOGRAM, URETEROSCOPY STONE REMOVAL AND STENT REMOVAL;  Surgeon: Molli Hazard, MD;  Location: [facility];  Service:  Urology;  Laterality: Right;  . HOLMIUM LASER APPLICATION N/A 1/61/0960   Procedure: HOLMIUM LASER APPLICATION;  Surgeon: Molli Hazard, MD;  Location: [facility];  Service: Urology;  Laterality: N/A;  . INSERTION OF DIALYSIS CATHETER N/A 09/24/2013   Procedure: INSERTION OF DIALYSIS CATHETER;  Surgeon: Rosetta Posner, MD;  Location: [facility];  Service: Vascular;  Laterality: N/A;  . IR AV DIALY SHUNT INTRO NEEDLE/INTRAC INITIAL W/PTA/STENT/IMG LT Left 08/13/2018  . IR AV DIALY SHUNT INTRO NEEDLE/INTRACATH INITIAL W/PTA/IMG LEFT  04/19/2018  . IR AV DIALY SHUNT INTRO NEEDLE/INTRACATH INITIAL W/PTA/IMG LEFT  08/14/2018  . IR FLUORO GUIDE CV LINE RIGHT  09/17/2018  . IR FLUORO GUIDE CV LINE RIGHT  09/20/2018  . IR US GUIDE VASC ACCESS LEFT  04/19/2018  . IR US GUIDE VASC ACCESS LEFT  08/13/2018  . IR US GUIDE VASC ACCESS LEFT  08/14/2018  . IR US GUIDE VASC ACCESS RIGHT  09/17/2018  . PORT-A-CATH REMOVAL    . PORTACATH PLACEMENT    . REVISON OF ARTERIOVENOUS FISTULA Left 07/25/2016   Procedure: REVISON OF LEFT ARTERIOVENOUS FISTULA;  Surgeon: Elam Dutch, MD;  Location: [facility];  Service:  Vascular;  Laterality: Left;  . THROMBECTOMY AND REVISION OF ARTERIOVENTOUS (AV) GORETEX  GRAFT Left 10/10/2018   Procedure: attempted of THROMBECTOMY OF ARTERIOVENTOUS FISTULA LEFT ARM;  Surgeon: Waynetta Sandy, MD;  Location: [facility];  Service: Vascular;  Laterality: Left;     OB History   No obstetric history on file.      Home Medications    Prior to Admission medications   Medication Sig Start Date End Date Taking? Authorizing Provider  acetaminophen (TYLENOL) 500 MG tablet Take 1,000 mg by mouth daily as needed for moderate pain.   Yes [provider]  aspirin EC 81 MG tablet Take 81 mg by mouth at bedtime.    Yes [provider]  B Complex-C-Folic Acid (NEPHRO-VITE PO) Take 1 tablet by mouth daily.   Yes [provider]  Calcium Acetate 667 MG TABS Take 667 mg  by mouth See admin instructions. Take 667 mg by mouth three times a day with meals and 667 mg with each snack   Yes [provider]  Dakins (SODIUM HYPOCHLORITE) 0.25 % SOLN Apply 1 application topically See admin instructions. Apply to wounds as directed 2 times daily   Yes [provider]  DULoxetine (CYMBALTA) 60 MG capsule Take 1 capsule (60 mg total) by mouth daily. 12/03/12  Yes Short, Noah Delaine, MD  famotidine (PEPCID) 20 MG tablet TAKE 1 TABLET BY MOUTH AT BEDTIME Patient taking differently: Take 20 mg by mouth at bedtime.  10/15/14  Yes Tanda Rockers, MD  glipiZIDE (GLUCOTROL) 5 MG tablet Take 0.5 tablets (2.5 mg total) by mouth daily. 08/31/16  Yes Debbe Odea, MD  levothyroxine (SYNTHROID, LEVOTHROID) 75 MCG tablet Take 75 mcg by mouth daily before breakfast.  08/17/15  Yes [provider]  midodrine (PROAMATINE) 10 MG tablet Take 1 tablet (10 mg total) by mouth 3 (three) times daily. Patient taking differently: Take 10 mg by mouth See admin instructions. Take 10 mg by mouth in the morning on Mon/Wed/Fri ONLY- before dialysis 08/31/16  Yes Rizwan, Eunice Blase, MD  Multiple Vitamin (MULTIVITAMIN WITH MINERALS) TABS tablet Take 1 tablet by mouth daily.   Yes [provider]  oxyCODONE-acetaminophen (PERCOCET/ROXICET) 5-325 MG tablet Take 1-2 tablets by mouth every 6 (six) hours as needed. Patient has not taken since started dialysis 08/2013 due to causes hypotension Patient taking differently: Take 2 tablets by mouth every 6 (six) hours as needed for moderate pain.  07/25/16  Yes Virgina Jock A, PA-C  pravastatin (PRAVACHOL) 40 MG tablet Take 40 mg by mouth every evening.  12/03/12  Yes Short, Noah Delaine, MD  Probiotic Product (PROBIOTIC DAILY PO) Take 1 capsule by mouth 2 (two) times daily.    Yes [provider]  sevelamer carbonate (RENVELA) 800 MG tablet Take 2,400 mg by mouth 3 (three) times daily with meals.   Yes [provider]  diltiazem  (CARDIZEM) 30 MG tablet Take 1 tablet every 4 hours AS NEEDED for rapid heart rate lasting more than 30 mins as long as top blood pressure >100. Patient taking differently: Take 30 mg by mouth every 4 (four) hours as needed. Take 30 mg by mouth every four hours as needed for rapid heart rate lasting more than 30 minutes, as long as Systolic number ("top blood pressure") is >100 03/09/17   Sherran Needs, NP  diphenhydrAMINE (BENADRYL) 25 MG tablet Take 25 mg by mouth daily as needed for allergies.    [provider]  feeding supplement, RESOURCE BREEZE, (  RESOURCE BREEZE) LIQD Take 1 Container by mouth daily as needed (for meal supplementation).     [provider]  metroNIDAZOLE (FLAGYL) 500 MG tablet Take 1 tablet (500 mg total) by mouth every 8 (eight) hours. 01/31/19   Ghimire, Henreitta Leber, MD  sucroferric oxyhydroxide (VELPHORO) 500 MG chewable tablet Chew 500-1,000 mg by mouth See admin instructions. Chew 1,000 mg by mouth with each meal and 500 mg with each snack    [provider]    Family History Family History  Problem Relation Age of Onset  . Coronary artery disease Mother   . Hypertension Mother   . Diabetes type II Mother   . Coronary artery disease Father   . Hypertension Father   . Malignant hyperthermia Father   . Cancer Maternal Grandfather        ? Type  . Kidney failure Maternal Grandmother     Social History Social History   Tobacco Use  . Smoking status: Former Smoker    Packs/day: 0.25    Years: 1.00    Pack years: 0.25    Types: Cigarettes    Last attempt to quit: 11/28/1979    Years since quitting: 39.3  . Smokeless tobacco: Never Used  Substance Use Topics  . Alcohol use: No  . Drug use: No     Allergies   Rosiglitazone; Amoxicillin; and Amoxicillin-pot clavulanate   Review of Systems Review of Systems  Constitutional: Positive for fever. Negative for chills.  HENT: Positive for congestion (chronic). Negative for  rhinorrhea.   Eyes: Negative for redness and visual disturbance.  Respiratory: Positive for shortness of breath. Negative for cough and wheezing.   Cardiovascular: Negative for chest pain and palpitations.  Gastrointestinal: Negative for nausea and vomiting.  Genitourinary: Negative for dysuria and urgency.  Musculoskeletal: Negative for arthralgias and myalgias.  Skin: Negative for pallor and wound.  Neurological: Negative for dizziness and headaches.     Physical Exam Updated Vital Signs BP 128/74   Pulse 87   Temp 99 F (37.2 C) (Oral)   Resp 18   Ht 5' 6.5" (1.689 m)   LMP 06/13/2013   SpO2 100%   BMI 68.35 kg/m   Physical Exam Vitals signs and nursing note reviewed.  Constitutional:      General: She is not in acute distress.    Appearance: She is well-developed. She is morbidly obese. She is not diaphoretic.  HENT:     Head: Normocephalic and atraumatic.  Eyes:     Pupils: Pupils are equal, round, and reactive to light.  Neck:     Musculoskeletal: Normal range of motion and neck supple.  Cardiovascular:     Rate and Rhythm: Normal rate and regular rhythm.     Heart sounds: No murmur. No friction rub. No gallop.   Pulmonary:     Effort: Pulmonary effort is normal.     Breath sounds: Rales (bases) present. No wheezing.  Chest:     Comments: Tunneled cath to the r chest wall.   Abdominal:     General: There is no distension.     Palpations: Abdomen is soft.     Tenderness: There is no abdominal tenderness.  Musculoskeletal:        General: No tenderness.  Skin:    General: Skin is warm and dry.  Neurological:     Mental Status: She is alert and oriented to person, place, and time.  Psychiatric:        Behavior: Behavior  normal.      ED Treatments / Results  Labs (all labs ordered are listed, but only abnormal results are displayed) Labs Reviewed  TROPONIN I - Abnormal; Notable for the following components:      Result Value   Troponin I 0.14 (*)     All other components within normal limits  CBC WITH DIFFERENTIAL/PLATELET - Abnormal; Notable for the following components:   WBC 28.9 (*)    RBC 3.35 (*)    Hemoglobin 10.1 (*)    MCV 107.8 (*)    MCHC 28.0 (*)    RDW 16.7 (*)    Neutro Abs 26.9 (*)    Monocytes Absolute 1.2 (*)    All other components within normal limits  BASIC METABOLIC PANEL - Abnormal; Notable for the following components:   Sodium 133 (*)    Potassium 5.5 (*)    CO2 21 (*)    Glucose, Bld 130 (*)    BUN 90 (*)    Creatinine, Ser 12.52 (*)    GFR calc non Af Amer 3 (*)    GFR calc Af Amer 3 (*)    All other components within normal limits  BRAIN NATRIURETIC PEPTIDE - Abnormal; Notable for the following components:   B Natriuretic Peptide 277.2 (*)    All other components within normal limits  TROPONIN I - Abnormal; Notable for the following components:   Troponin I 0.19 (*)    All other components within normal limits  CBG MONITORING, ED - Abnormal; Notable for the following components:   Glucose-Capillary 141 (*)    All other components within normal limits  SARS CORONAVIRUS 2 (HOSPITAL ORDER, [place] [facility])  CULTURE, BLOOD (ROUTINE X 2)  CULTURE, BLOOD (ROUTINE X 2)  LACTIC ACID, PLASMA  LACTIC ACID, PLASMA    EKG EKG Interpretation  Date/Time:  Monday March 24 2019 16:16:11 EDT Ventricular Rate:  90 PR Interval:    QRS Duration: 110 QT Interval:  370 QTC Calculation: 453 R Axis:   84 Text Interpretation:  Sinus rhythm Low voltage, precordial leads No significant change since last tracing Confirmed by Deno Etienne [phone]) on 03/24/2019 4:57:00 PM   Radiology Dg Chest Port 1 View  Result Date: 03/24/2019 CLINICAL DATA:  58 year old female with shortness of breath EXAM: PORTABLE CHEST 1 VIEW COMPARISON:  01/29/2019, 01/27/2019, 09/18/2018 FINDINGS: Cardiomediastinal silhouette likely unchanged. Right rotation of the patient. Unchanged right IJ hemodialysis catheter. No  pneumothorax.  No large pleural effusion. Low lung volumes. Similar appearance of thickening of the minor fissure. No interlobular septal thickening. Hazy opacity on the left is related to the chest wall soft tissues and right rotation. IMPRESSION: No evidence of acute cardiopulmonary disease with low lung volumes and unchanged right IJ hemodialysis catheter Electronically Signed   By: Corrie Mckusick D.O.   On: 03/24/2019 18:41    Procedures Procedures (including critical care time)  Medications Ordered in ED Medications  aspirin chewable tablet 324 mg (324 mg Oral Given 03/24/19 2115)     Initial Impression / Assessment and Plan / ED Course  I have reviewed the triage vital signs and the nursing notes.  Pertinent labs & imaging results that were available during my care of the patient were reviewed by me and considered in my medical decision making (see chart for details).        58 yo F with a cc of sob and fever.  Going on since yesterday.  Missed dialysis today.  Patient fluid overloaded clinically, with fever at home, concern for novel coronavirus.  Will obtain labs, cxr, ecg.  Hypoxic on room air which from the last admission note sounds her baseline.   Trop is elevated.  Much higher than prior.  Discussed admission with patient who initially was hesitant but willing to have me repeat.  Unfortunately trending up.  Talked with cards fellow, though unlikely to be cardiac.  Recommend medical admission continue to trend trops.   CRITICAL CARE Performed by: Cecilio Asper   Total critical care time: 35 minutes  Critical care time was exclusive of separately billable procedures and treating other patients.  Critical care was necessary to treat or prevent imminent or life-threatening deterioration.  Critical care was time spent personally by me on the following activities: development of treatment plan with patient and/or surrogate as well as nursing, discussions with  consultants, evaluation of patient's response to treatment, examination of patient, obtaining history from patient or surrogate, ordering and performing treatments and interventions, ordering and review of laboratory studies, ordering and review of radiographic studies, pulse oximetry and re-evaluation of patient's condition.  The patients results and plan were reviewed and discussed.   Any x-rays performed were independently reviewed by myself.   Differential diagnosis were considered with the presenting HPI.  Medications  aspirin chewable tablet 324 mg (324 mg Oral Given 03/24/19 2115)    Vitals:   03/24/19 1715 03/24/19 1745 03/24/19 1900 03/24/19 2000  BP:  (!) 103/44 116/66 128/74  Pulse: 85 88  87  Resp: 15 20 16 18   Temp:      TempSrc:      SpO2: 100% 99%  100%  Height:        Final diagnoses:  SOB (shortness of breath)  Fever in adult    Admission/ observation were discussed with the admitting physician, patient and/or family and they are comfortable with the plan.   Final Clinical Impressions(s) / ED Diagnoses   Final diagnoses:  SOB (shortness of breath)  Fever in adult    ED Discharge Orders    None       Deno Etienne, DO 03/24/19 2232

## 2019-03-24 NOTE — Progress Notes (Deleted)
Virtual Visit via Video Note   This visit type was conducted due to national recommendations for restrictions regarding the COVID-19 Pandemic (e.g. social distancing) in an effort to limit this patient's exposure and mitigate transmission in our community.  Due to her co-morbid illnesses, this patient is at least at moderate risk for complications without adequate follow up.  This format is felt to be most appropriate for this patient at this time.  All issues noted in this document were discussed and addressed.  A limited physical exam was performed with this format.  Please refer to the patient's chart for her consent to telehealth for [facility].  Evaluation Performed:  Follow-up visit  This visit type was conducted due to national recommendations for restrictions regarding the COVID-19 Pandemic (e.g. social distancing).  This format is felt to be most appropriate for this patient at this time.  All issues noted in this document were discussed and addressed.  No physical exam was performed (except for noted visual exam findings with Video Visits).  Please refer to the patient's chart (MyChart message for video visits and phone note for telephone visits) for the patient's consent to telehealth for [facility].  Date:  03/24/2019   ID:  [name], DOB 24-Feb-1961, MRN [id number]  Patient Location:  Home  Provider location:   [place]  PCP:  Harlan Stains, MD  Cardiologist:  Fransico Him, MD  Electrophysiologist:  None   Chief Complaint:  DCM, CHF, PAF, HTN  History of Present Illness:    [name] is a 58 y.o. female who presents via audio/video conferencing for a telehealth visit today.    [name] is a 58y.o. female with a h/o ESRD on dilaysis, dilated cardiomyopathy (now with normalized EF at 50-55%), DM, morbid obesity, chronic diastolic HF, chronically low BP's, chronic shortness of breath with COPD with 02 at night and hydradenitis suppurativa.  She also has  a history of paroxysmal atrial fibrillation followed in A. fib clinic.  She is not anticoagulated due to open skin lesion secondary to hidradenitis on her groin and buttocks which cause constant bleeding and oozing.  She takes Cardizem 30 mg 4 times daily as needed for breakthrough palpitations as she cannot be on long-acting calcium channel blocker or beta-blocker due to issues with hypotension during hemodialysis.  She is here today for followup and is doing well.  She denies any chest pain or pressure, SOB, DOE, PND, orthopnea, LE edema, dizziness, palpitations or syncope. She is compliant with his meds and is tolerating meds with no SE.    The patient does not have symptoms concerning for COVID-19 infection (fever, chills, cough, or new shortness of breath).   Prior CV studies:   The following studies were reviewed today:  2D echo 01/2019  Past Medical History:  Diagnosis Date   Chronic diastolic CHF (congestive heart failure) (HCC)    DCM (dilated cardiomyopathy) ([place])    a. presumed nonischemic - no prior cath/nuc. EF 35-40% in 2011, most recently  60-65% by echo 2017.   Depression    DM type 2 (diabetes mellitus, type 2) ([place])    diet controlledwith retinopathy and nephropathy   ESRD on hemodialysis ([place]) 08/21/2017   GERD (gastroesophageal reflux disease)    H/O cardiac arrest 10/2012   Hard of hearing 10/2012   now wears 2 hearing aids   History of hemodialysis dec 2013   HTN (hypertension)    Hydradenitis    Hyperkalemia    Hyperlipidemia  Hypothyroidism    Iron deficiency anemia    Morbid obesity ([place])    Multinodular goiter    Neuropathic pain 08/21/2017   Orthostatic hypotension 06/27/2018   PAF (paroxysmal atrial fibrillation) (HCC)    Paroxysmal atrial flutter ([place])    Pneumonia Nov 10, 2012   Sleep apnea    Intolerant to CPAP   Tinnitus 12/26/2016   Typical atrial flutter ([place]) 04/10/2017   Urinary tract infection    taking antibiotics  for 3 days prior to surgery   Past Surgical History:  Procedure Laterality Date   AV FISTULA PLACEMENT Left 07/25/2013   Procedure: ARTERIOVENOUS (AV) FISTULA CREATION ;  Surgeon: Rosetta Posner, MD;  Location: [place];  Service: Vascular;  Laterality: Left;   AV FISTULA PLACEMENT Left 10/10/2018   Procedure: INSERTION OF LEFT UPPER ARM brachial to axillary artery ARTERIOVENOUS (AV) GORE-TEX GRAFT using 4-7 stretch graft;  Surgeon: Waynetta Sandy, MD;  Location: [place];  Service: Vascular;  Laterality: Left;   CYSTOSCOPY W/ RETROGRADES  11/16/2012   Procedure: CYSTOSCOPY WITH RETROGRADE PYELOGRAM;  Surgeon: Reece Packer, MD;  Location: [facility];  Service: Urology;  Laterality: Bilateral;  CYSTOSCOPY,BILATERAL RETROGRADE PYELOGRAM/ REMOVAL LEFT URETERAL STENT/ FULGERATION BLADDER MUCOSA/ INSERTION RIGHT URETERAL STENT   CYSTOSCOPY W/ URETERAL STENT PLACEMENT  05/28/2012   Procedure: CYSTOSCOPY WITH RETROGRADE PYELOGRAM/URETERAL STENT PLACEMENT;  Surgeon: Reece Packer, MD;  Location[facility];  Service: Urology;  Laterality: Left;   CYSTOSCOPY WITH RETROGRADE PYELOGRAM, URETEROSCOPY AND STENT PLACEMENT Right 06/23/2013   Procedure: CYSTOSCOPY WITH RIGHT URETEROSCOPY, RIGHT  RETROGRADE PYELOGRAM, WITH LASER LIPOTRIPSY AND RIGHT URETERAL STENT EXCHANGE ;  Surgeon: Molli Hazard, MD;  Location: [facility];  Service: Urology;  Laterality: Right;  STENT EXCHANGE     CYSTOSCOPY WITH RETROGRADE PYELOGRAM, URETEROSCOPY AND STENT PLACEMENT Right 11/06/2013   Procedure: CYSTOSCOPY WITH RETROGRADE PYELOGRAM, URETEROSCOPY STONE REMOVAL AND STENT REMOVAL;  Surgeon: Molli Hazard, MD;  Location: [facility];  Service: Urology;  Laterality: Right;   HOLMIUM LASER APPLICATION N/A 1/61/0960   Procedure: HOLMIUM LASER APPLICATION;  Surgeon: Molli Hazard, MD;  Location: [facility];  Service: Urology;  Laterality: N/A;   INSERTION OF DIALYSIS CATHETER N/A 09/24/2013   Procedure:  INSERTION OF DIALYSIS CATHETER;  Surgeon: Rosetta Posner, MD;  Location: [place];  Service: Vascular;  Laterality: N/A;   IR AV DIALY SHUNT INTRO NEEDLE/INTRAC INITIAL W/PTA/STENT/IMG LT Left 08/13/2018   IR AV DIALY SHUNT INTRO NEEDLE/INTRACATH INITIAL W/PTA/IMG LEFT  04/19/2018   IR AV DIALY SHUNT INTRO NEEDLE/INTRACATH INITIAL W/PTA/IMG LEFT  08/14/2018   IR FLUORO GUIDE CV LINE RIGHT  09/17/2018   IR FLUORO GUIDE CV LINE RIGHT  09/20/2018   IR US GUIDE VASC ACCESS LEFT  04/19/2018   IR US GUIDE VASC ACCESS LEFT  08/13/2018   IR US GUIDE VASC ACCESS LEFT  08/14/2018   IR US GUIDE VASC ACCESS RIGHT  09/17/2018   PORT-A-CATH REMOVAL     PORTACATH PLACEMENT     REVISON OF ARTERIOVENOUS FISTULA Left 07/25/2016   Procedure: REVISON OF LEFT ARTERIOVENOUS FISTULA;  Surgeon: Elam Dutch, MD;  Location: [place];  Service: Vascular;  Laterality: Left;   THROMBECTOMY AND REVISION OF ARTERIOVENTOUS (AV) GORETEX  GRAFT Left 10/10/2018   Procedure: attempted of THROMBECTOMY OF ARTERIOVENTOUS FISTULA LEFT ARM;  Surgeon: Waynetta Sandy, MD;  Location: [place];  Service: Vascular;  Laterality: Left;     No outpatient medications have been marked as taking  for the 03/25/19 encounter (Appointment) with Sueanne Margarita, MD.     Allergies:   Rosiglitazone; Amoxicillin; and Amoxicillin-pot clavulanate   Social History   Tobacco Use   Smoking status: Former Smoker    Packs/day: 0.25    Years: 1.00    Pack years: 0.25    Types: Cigarettes    Last attempt to quit: 11/28/1979    Years since quitting: 39.3   Smokeless tobacco: Never Used  Substance Use Topics   Alcohol use: No   Drug use: No     Family Hx: The patient's family history includes Cancer in her maternal grandfather; Coronary artery disease in her father and mother; Diabetes type II in her mother; Hypertension in her father and mother; Kidney failure in her maternal grandmother; Malignant hyperthermia in her father.  ROS:    Please see the history of present illness.     All other systems reviewed and are negative.   Labs/Other Tests and Data Reviewed:    Recent Labs: 01/29/2019: ALT 14 03/24/2019: B Natriuretic Peptide 277.2; BUN 90; Creatinine, Ser 12.52; Hemoglobin 10.1; Platelets 289; Potassium 5.5; Sodium 133   Recent Lipid Panel Lab Results  Component Value Date/Time   CHOL 80 01/20/2012 04:39 PM   TRIG 55 01/20/2012 04:39 PM   HDL 28 (L) 01/20/2012 04:39 PM   CHOLHDL 2.9 01/20/2012 04:39 PM   LDLCALC 41 01/20/2012 04:39 PM    Wt Readings from Last 3 Encounters:  01/30/19 (!) 429 lb 14.4 oz (195 kg)  01/23/19 (!) 395 lb (179.2 kg)  10/10/18 (!) 395 lb 8.1 oz (179.4 kg)     Objective:    Vital Signs:  LMP 06/13/2013    CONSTITUTIONAL:  Well nourished, well developed female in no acute distress.  EYES: anicteric MOUTH: oral mucosa is pink RESPIRATORY: Normal respiratory effort, symmetric expansion CARDIOVASCULAR: No peripheral edema SKIN: No rash, lesions or ulcers MUSCULOSKELETAL: no digital cyanosis NEURO: Cranial Nerves II-XII grossly intact, moves all extremities PSYCH: Intact judgement and insight.  A&O x 3, Mood/affect appropriate   ASSESSMENT & PLAN:    1.  Chronic diastolic CHF -her weight has been stable.  She has chronic shortness of breath from COPD that is not changed.  She is followed by nephrology and volume is controlled by hemodialysis.  2.  Dilated cardiomyopathy - her 2D echo  done earlier this year showed normal LV function with EF greater than 65% right-sided chambers were also normal.  3.  Hypertension- she checks her blood pressure at home and has been controlled.  She has not required any antihypertensive medication.   4.  Persistent atrial fibrillation -she thinks she is still in normal sinus rhythm.  She has not had any palpitations.  She is not on long-term anticoagulation due to significant supportive hidradenitis which causes constant bruising and bleeding  from her buttocks.   5.  Orthostatic hypotension - this occurs mainly on hemodialysis and is still on Midrin 10 mg on dialysis days.  6.  ESRD - she is followed by nephrology on HD   7.  Type 2 DM with ESRD - this is followed by her PCP.  Her last HbA1C was 6.3 a year ago.  She will continue on  Glipizide 2.5mg  daily.  8. COVID-19 Education:The signs and symptoms of COVID-19 were discussed with the patient and how to seek care for testing (follow up with PCP or arrange E-visit).  The importance of social distancing was discussed today.  Patient Risk:  After full review of this patient's clinical status, I feel that they are at least moderate risk at this time.  Time:   Today, I have spent her a minutes directly with the patient on video discussing medical problems including CHF, DCM, HTN, PAF, orthostatic hypotension.  We also reviewed the symptoms of COVID 19 and the ways to protect against contracting the virus with telehealth technology.  I spent an additional 10 10 minutes reviewing patient's chart including 2D echo and office visit notes.  Medication Adjustments/Labs and Tests Ordered: Current medicines are reviewed at length with the patient today.  Concerns regarding medicines are outlined above.  Tests Ordered: No orders of the defined types were placed in this encounter.  Medication Changes: No orders of the defined types were placed in this encounter.   Disposition:  Follow up in 1 year(s)  Signed, Fransico Him, MD  03/24/2019 8:54 PM    [place]

## 2019-03-25 ENCOUNTER — Telehealth: Payer: Medicare Other | Admitting: Cardiology

## 2019-03-25 DIAGNOSIS — L8990 Pressure ulcer of unspecified site, unspecified stage: Secondary | ICD-10-CM | POA: Diagnosis present

## 2019-03-25 DIAGNOSIS — R0603 Acute respiratory distress: Secondary | ICD-10-CM | POA: Diagnosis not present

## 2019-03-25 DIAGNOSIS — R0602 Shortness of breath: Secondary | ICD-10-CM | POA: Diagnosis not present

## 2019-03-25 LAB — CBC WITH DIFFERENTIAL/PLATELET
Abs Immature Granulocytes: 0 10*3/uL (ref 0.00–0.07)
Basophils Absolute: 0.3 10*3/uL — ABNORMAL HIGH (ref 0.0–0.1)
Basophils Relative: 1 %
Eosinophils Absolute: 0.3 10*3/uL (ref 0.0–0.5)
Eosinophils Relative: 1 %
HCT: 33.8 % — ABNORMAL LOW (ref 36.0–46.0)
Hemoglobin: 9.8 g/dL — ABNORMAL LOW (ref 12.0–15.0)
Lymphocytes Relative: 3 %
Lymphs Abs: 0.8 10*3/uL (ref 0.7–4.0)
MCH: 31.3 pg (ref 26.0–34.0)
MCHC: 29 g/dL — ABNORMAL LOW (ref 30.0–36.0)
MCV: 108 fL — ABNORMAL HIGH (ref 80.0–100.0)
Monocytes Absolute: 0.8 10*3/uL (ref 0.1–1.0)
Monocytes Relative: 3 %
Neutro Abs: 24.2 10*3/uL — ABNORMAL HIGH (ref 1.7–7.7)
Neutrophils Relative %: 92 %
Platelets: 248 10*3/uL (ref 150–400)
RBC: 3.13 MIL/uL — ABNORMAL LOW (ref 3.87–5.11)
RDW: 16.7 % — ABNORMAL HIGH (ref 11.5–15.5)
WBC: 26.3 10*3/uL — ABNORMAL HIGH (ref 4.0–10.5)
nRBC: 0 % (ref 0.0–0.2)
nRBC: 1 /100 WBC — ABNORMAL HIGH

## 2019-03-25 LAB — COMPREHENSIVE METABOLIC PANEL
ALT: 15 U/L (ref 0–44)
AST: 21 U/L (ref 15–41)
Albumin: 2.4 g/dL — ABNORMAL LOW (ref 3.5–5.0)
Alkaline Phosphatase: 118 U/L (ref 38–126)
Anion gap: 15 (ref 5–15)
BUN: 99 mg/dL — ABNORMAL HIGH (ref 6–20)
CO2: 20 mmol/L — ABNORMAL LOW (ref 22–32)
Calcium: 9.2 mg/dL (ref 8.9–10.3)
Chloride: 97 mmol/L — ABNORMAL LOW (ref 98–111)
Creatinine, Ser: 13.8 mg/dL — ABNORMAL HIGH (ref 0.44–1.00)
GFR calc Af Amer: 3 mL/min — ABNORMAL LOW (ref >60)
GFR calc non Af Amer: 3 mL/min — ABNORMAL LOW (ref >60)
Glucose, Bld: 149 mg/dL — ABNORMAL HIGH (ref 70–99)
Potassium: 5.7 mmol/L — ABNORMAL HIGH (ref 3.5–5.1)
Sodium: 132 mmol/L — ABNORMAL LOW (ref 135–145)
Total Bilirubin: 0.7 mg/dL (ref 0.3–1.2)
Total Protein: 8.3 g/dL — ABNORMAL HIGH (ref 6.5–8.1)

## 2019-03-25 LAB — CK TOTAL AND CKMB (NOT AT ARMC)
CK, MB: 4.9 ng/mL (ref 0.5–5.0)
Relative Index: 2.2 (ref 0.0–2.5)
Total CK: 225 U/L (ref 38–234)

## 2019-03-25 LAB — GLUCOSE, CAPILLARY
Glucose-Capillary: 125 mg/dL — ABNORMAL HIGH (ref 70–99)
Glucose-Capillary: 159 mg/dL — ABNORMAL HIGH (ref 70–99)
Glucose-Capillary: 168 mg/dL — ABNORMAL HIGH (ref 70–99)

## 2019-03-25 LAB — TROPONIN I
Troponin I: 0.19 ng/mL (ref <0.03)
Troponin I: 0.16 ng/mL (ref <0.03)

## 2019-03-25 MED ORDER — ACETAMINOPHEN 650 MG RE SUPP: 650.0000 mg | Freq: Four times a day (QID) | RECTAL | Status: DC | PRN

## 2019-03-25 MED ORDER — SODIUM BICARBONATE 8.4 % IV SOLN: 50.0000 meq | Freq: Once | INTRAVENOUS | Status: DC

## 2019-03-25 MED ORDER — SODIUM ZIRCONIUM CYCLOSILICATE 10 G PO PACK
10.0000 g | PACK | Freq: Every day | ORAL | Status: DC
  Filled 2019-03-25: qty 1

## 2019-03-25 MED ORDER — DAKINS (1/4 STRENGTH) 0.125 % EX SOLN
Freq: Two times a day (BID) | CUTANEOUS | Status: DC
  Filled 2019-03-25: qty 473

## 2019-03-25 MED ORDER — DAKINS (1/2 STRENGTH) 0.25 % EX SOLN
1.0000 "application " | Freq: Two times a day (BID) | CUTANEOUS | Status: DC
  Filled 2019-03-25: qty 473

## 2019-03-25 MED ORDER — OXYCODONE-ACETAMINOPHEN 5-325 MG PO TABS
2.0000 | ORAL_TABLET | Freq: Four times a day (QID) | ORAL | Status: DC | PRN
  Administered 2019-03-25: 2 via ORAL
  Filled 2019-03-25: qty 2

## 2019-03-25 MED ORDER — DIPHENHYDRAMINE HCL 25 MG PO CAPS: 25.0000 mg | ORAL_CAPSULE | Freq: Every day | ORAL | Status: DC | PRN

## 2019-03-25 MED ORDER — SODIUM CHLORIDE 0.9% FLUSH: 3.0000 mL | Freq: Two times a day (BID) | INTRAVENOUS | Status: DC

## 2019-03-25 MED ORDER — VANCOMYCIN HCL 10 G IV SOLR
2500.0000 mg | INTRAVENOUS | Status: DC
  Filled 2019-03-25: qty 2500

## 2019-03-25 MED ORDER — PRAVASTATIN SODIUM 40 MG PO TABS: 40.0000 mg | ORAL_TABLET | Freq: Every evening | ORAL | Status: DC

## 2019-03-25 MED ORDER — METRONIDAZOLE 500 MG PO TABS: 500.0000 mg | ORAL_TABLET | Freq: Three times a day (TID) | ORAL | 0 refills | Status: AC

## 2019-03-25 MED ORDER — FAMOTIDINE 20 MG PO TABS: 20.0000 mg | ORAL_TABLET | Freq: Every day | ORAL | Status: DC

## 2019-03-25 MED ORDER — SODIUM POLYSTYRENE SULFONATE 15 GM/60ML PO SUSP
15.0000 g | Freq: Once | ORAL | Status: DC
  Filled 2019-03-25: qty 60

## 2019-03-25 MED ORDER — BOOST PLUS PO LIQD
1.0000 | Freq: Every day | ORAL | Status: DC | PRN
  Filled 2019-03-25: qty 237

## 2019-03-25 MED ORDER — RENA-VITE PO TABS
1.0000 | ORAL_TABLET | Freq: Every day | ORAL | Status: DC
  Filled 2019-03-25: qty 1

## 2019-03-25 MED ORDER — METRONIDAZOLE 500 MG PO TABS: 500.0000 mg | ORAL_TABLET | Freq: Three times a day (TID) | ORAL | 0 refills | Status: DC

## 2019-03-25 MED ORDER — SODIUM CHLORIDE 0.9 % IV SOLN
2.0000 g | INTRAVENOUS | Status: DC
  Filled 2019-03-25: qty 2

## 2019-03-25 MED ORDER — CALCIUM ACETATE (PHOS BINDER) 667 MG PO CAPS: 667.0000 mg | ORAL_CAPSULE | ORAL | Status: DC | PRN

## 2019-03-25 MED ORDER — ACETAMINOPHEN 325 MG PO TABS: 650.0000 mg | ORAL_TABLET | Freq: Four times a day (QID) | ORAL | Status: DC | PRN

## 2019-03-25 MED ORDER — CALCIUM ACETATE (PHOS BINDER) 667 MG PO CAPS
667.0000 mg | ORAL_CAPSULE | Freq: Three times a day (TID) | ORAL | Status: DC
  Administered 2019-03-25 (×2): 667 mg via ORAL
  Filled 2019-03-25 (×2): qty 1

## 2019-03-25 MED ORDER — ADULT MULTIVITAMIN W/MINERALS CH: 1.0000 | ORAL_TABLET | Freq: Every day | ORAL | Status: DC

## 2019-03-25 MED ORDER — SODIUM CHLORIDE 0.9 % IV SOLN: 2.0000 g | INTRAVENOUS | Status: AC

## 2019-03-25 MED ORDER — SODIUM CHLORIDE 0.9 % IV SOLN: 250.0000 mL | INTRAVENOUS | Status: DC | PRN

## 2019-03-25 MED ORDER — HEPARIN SODIUM (PORCINE) 5000 UNIT/ML IJ SOLN: 5000.0000 [IU] | Freq: Three times a day (TID) | INTRAMUSCULAR | Status: DC

## 2019-03-25 MED ORDER — ORAL CARE MOUTH RINSE: 15.0000 mL | Freq: Two times a day (BID) | OROMUCOSAL | Status: DC

## 2019-03-25 MED ORDER — SODIUM CHLORIDE 0.9 % IV SOLN
2.0000 g | INTRAVENOUS | Status: AC
  Administered 2019-03-25: 02:00:00 2 g via INTRAVENOUS
  Filled 2019-03-25: qty 2

## 2019-03-25 MED ORDER — CHLORHEXIDINE GLUCONATE CLOTH 2 % EX PADS: 6.0000 | MEDICATED_PAD | Freq: Every day | CUTANEOUS | Status: DC

## 2019-03-25 MED ORDER — METRONIDAZOLE 500 MG PO TABS
500.0000 mg | ORAL_TABLET | Freq: Three times a day (TID) | ORAL | Status: DC
  Administered 2019-03-25: 14:00:00 500 mg via ORAL
  Filled 2019-03-25: qty 1

## 2019-03-25 MED ORDER — MIDODRINE HCL 5 MG PO TABS
10.0000 mg | ORAL_TABLET | ORAL | Status: DC
  Filled 2019-03-25 (×2): qty 2

## 2019-03-25 MED ORDER — SODIUM CHLORIDE 0.9 % IV SOLN
INTRAVENOUS | Status: DC | PRN
  Administered 2019-03-25 (×2): 250 mL via INTRAVENOUS

## 2019-03-25 MED ORDER — GLIPIZIDE 2.5 MG HALF TABLET
2.5000 mg | ORAL_TABLET | Freq: Every day | ORAL | Status: DC
  Administered 2019-03-25: 2.5 mg via ORAL
  Filled 2019-03-25 (×2): qty 1

## 2019-03-25 MED ORDER — LEVOTHYROXINE SODIUM 75 MCG PO TABS
75.0000 ug | ORAL_TABLET | Freq: Every day | ORAL | Status: DC
  Administered 2019-03-25: 75 ug via ORAL
  Filled 2019-03-25: qty 1

## 2019-03-25 MED ORDER — MIDODRINE HCL 5 MG PO TABS
10.0000 mg | ORAL_TABLET | Freq: Once | ORAL | Status: AC
  Administered 2019-03-25: 10 mg via ORAL

## 2019-03-25 MED ORDER — CALCIUM GLUCONATE-NACL 1-0.675 GM/50ML-% IV SOLN
1.0000 g | Freq: Once | INTRAVENOUS | Status: DC
  Filled 2019-03-25: qty 50

## 2019-03-25 MED ORDER — SUCROFERRIC OXYHYDROXIDE 500 MG PO CHEW
1000.0000 mg | CHEWABLE_TABLET | Freq: Three times a day (TID) | ORAL | Status: DC
  Administered 2019-03-25 (×2): 1000 mg via ORAL
  Filled 2019-03-25 (×4): qty 2

## 2019-03-25 MED ORDER — DULOXETINE HCL 60 MG PO CPEP
60.0000 mg | ORAL_CAPSULE | Freq: Every day | ORAL | Status: DC
  Administered 2019-03-25: 60 mg via ORAL
  Filled 2019-03-25: qty 1

## 2019-03-25 MED ORDER — SUCROFERRIC OXYHYDROXIDE 500 MG PO CHEW
500.0000 mg | CHEWABLE_TABLET | ORAL | Status: DC | PRN
  Filled 2019-03-25: qty 1

## 2019-03-25 MED ORDER — SODIUM CHLORIDE 0.9% FLUSH: 3.0000 mL | INTRAVENOUS | Status: DC | PRN

## 2019-03-25 MED ORDER — METRONIDAZOLE 500 MG PO TABS: 500.0000 mg | ORAL_TABLET | Freq: Three times a day (TID) | ORAL | Status: DC

## 2019-03-25 MED ORDER — ASPIRIN EC 81 MG PO TBEC: 81.0000 mg | DELAYED_RELEASE_TABLET | Freq: Every day | ORAL | Status: DC

## 2019-03-25 MED ORDER — SEVELAMER CARBONATE 800 MG PO TABS
2400.0000 mg | ORAL_TABLET | Freq: Three times a day (TID) | ORAL | Status: DC
  Administered 2019-03-25 (×2): 2400 mg via ORAL
  Filled 2019-03-25 (×2): qty 3

## 2019-03-25 MED ORDER — VANCOMYCIN HCL IN DEXTROSE 1-5 GM/200ML-% IV SOLN: 1000.0000 mg | INTRAVENOUS | Status: DC

## 2019-03-25 NOTE — Progress Notes (Addendum)
Per  MD- patient ok to go to CT.  Called CT to come get.   When transport arrived - pt refused CT- stated she could not lay flat.    Messaged MD

## 2019-03-25 NOTE — Progress Notes (Addendum)
Asked to see this patient admitted observation status for shortness of breath. Examined the patient. Clear lungs with no edema. On nasal oxygen here as at home. No respiratory distress. We offered dialysis this afternoon, either inpatient or outpatient. She refused both. Says she will go to her regular dialysis tomorrow. She is not in distress. If patient is admitted full inpatient, we will do full consult.   Also patient has high WBC count similar to recent admission in March. Primary team feels this is most likely due to chronic skin infection and are recommending IV antibiotics at dialysis for 2 weeks. We will arrange for IV fortaz with dialysis for the next 2 weeks.   Anice Paganini, PA-C 03/25/2019, 2:38 PM  [facility] Pager: [phone]  Pt seen, examined and agree w A/P as above.  [facility] 03/25/2019, 2:59 PM

## 2019-03-25 NOTE — Progress Notes (Signed)
Paged MD-  Patient BP was low this morning- 88/60 manual.   Concern for sending patient down to CT.    Assessed patient-  Pt is asymptomatic ( no SOB, dizziness, chest pain, etc.).   Per patient BP does run low.

## 2019-03-25 NOTE — Consult Note (Signed)
[place] Nurse wound consult note  Consultation completed with assistance from bedside nurse/clinical staff and review of patient's record.   Patient with longstanding history of hydradenitis, she uses Dakin's solution to manage at home. While this is considered a chemical non selective debridement agent. Many use for odor control as a more palliative treatment for this condition that does not really have a treatment other wise unless excision considered of the sweat glands in the affected areas.   Reason for Consult: pressure injury and skin breakdown After discussion with bedside nurse it is reported patient has skin breakdown in the groin areas from her hydradenitis and a partial thickness area on her sacrum. She is ambulatory at home.  Wound type: partial thickness skin breakdown groin and apex of the gluteal cleft, ? MASD and hydradenitis flare.  Measurement: did not measure today, not documented in the flow sheet at this time Drainage (amount, consistency, odor) some bloody drainage per bedside nurse from the groin wounds Dressing procedure/placement/frequency:  Will continue Dakin's solution per admitting MDs orders and after discussion with bedside nurse and patient. Will continue silicone foam to the sacrum/gluteal cleft per the skin care order set. Based on BMI discussed need for bariatric bed to allow for moisture management and mobility. Bedside nurse stated the patient is ambulatory and able to get out of the bed. Has enough space in the current bed and patient is anuric.  [place] nurse will follow up when I return to the [facility] Wednesday for any additional needs.  [place], [place], [place]

## 2019-03-25 NOTE — Consult Note (Signed)
Cardiology Consult    [name] ID: [name] MRN: [id number], DOB/AGE: November 08, 1961   Admit date: 03/24/2019 Date of Consult: 03/25/2019  Primary Physician: Harlan Stains, MD Primary Cardiologist: Fransico Him, MD Requesting Provider: Eulogio Bear, DO  [name] Profile    [name] is a 58 y.o. female with a history of ESRD on hemodialysis, chronic diastolic CHF with EF >67% in 01/2019, paroxysmal atrial fibrillation followed by the A.fib clinic not on anticoagulation due to open lesion secondary to hydradenitis suppurative which cause constant bleeding, chronic shortness of breath with COPD on supplemental O2 at night, hypertension, hyperlipidemia, diabetes mellitus, and morbid obesity, who is being seen today for the evaluation of elevated troponin at the request of Dr. Eliseo Squires.  History of Present Illness    [name] is a 58 year old female with the above history who is followed by Dr. Radford Pax. She last saw Dr. Radford Pax for follow-up in 06/2018 at which time [name] reported doing well from a cardiac standpoint. She noted some occasionally palpitations that were fairly short-lived and resolved after 10 minutes. [name] takes Cardizem 30mg  four times daily on an as needed basis for breakthrough palpitations as she cannot tolerate long-acting calcium channel blockers or beta-blockers due to hypotension during hemodialysis. At that visit, [name] reported occasionally needing Cardizem to terminate atrial fibrillation. Her chronic shortness of breath was stable and controlled on supplemental O2. No medications changes were made at that time.  [name] presented to the ED yesterday for evaluation of shortness of breath, cough, and fever. In an effort to minimize exposure and conserve PPE given the current COVID-19 pandemic, I attempted to call into [name]'s room to obtain a history; however, [name] never answer. Per chart review, [name] has had worsening shortness of breath from baseline for the  past 2-3 days and notes some recent orthopnea. She normally is on 2 L of supplemental O2 at night but has had to wear it consistently over the past 2 days. [name] also notes an intermittent fever with temperature as high as 101. She also notes a mild dry cough. She missed her dialysis session yesterday because she wasn't feeling. She has denied any chest pain.  In the ED, vitals stable. EKG showed normal sinus rhythm with no significant changes from prior tracings. Initial troponin minimally elevated at 0.14. BNP elevated at 277.2. Chest x-ray low lung volumes but no acute findings. COVID-19 testing negative. WBC 28.9, Hgb 10.1, Plts 289. Na 133, K 5.5, Glucose 130, SCr 12.52  Past Medical History   Past Medical History:  Diagnosis Date  . Chronic diastolic CHF (congestive heart failure) ([facility])   . DCM (dilated cardiomyopathy) ([place])    a. presumed nonischemic - no prior cath/nuc. EF 35-40% in 2011, most recently  60-65% by echo 2017.  Marland Kitchen Depression   . DM type 2 (diabetes mellitus, type 2) ([facility])    diet controlledwith retinopathy and nephropathy  . ESRD on hemodialysis ([place]) 08/21/2017  . GERD (gastroesophageal reflux disease)   . H/O cardiac arrest 10/2012  . Hard of hearing 10/2012   now wears 2 hearing aids  . History of hemodialysis dec 2013  . HTN (hypertension)   . Hydradenitis   . Hyperkalemia   . Hyperlipidemia   . Hypothyroidism   . Iron deficiency anemia   . Morbid obesity ([place])   . Multinodular goiter   . Neuropathic pain 08/21/2017  . Orthostatic hypotension 06/27/2018  . PAF (paroxysmal atrial fibrillation) ([place])   . Paroxysmal atrial  flutter ([place])   . Pneumonia Nov 10, 2012  . Sleep apnea    Intolerant to CPAP  . Tinnitus 12/26/2016  . Typical atrial flutter ([place]) 04/10/2017  . Urinary tract infection    taking antibiotics for 3 days prior to surgery    Past Surgical History:  Procedure Laterality Date  . AV FISTULA PLACEMENT Left 07/25/2013   Procedure: ARTERIOVENOUS  (AV) FISTULA CREATION ;  Surgeon: Rosetta Posner, MD;  Location: [facility];  Service: Vascular;  Laterality: Left;  . AV FISTULA PLACEMENT Left 10/10/2018   Procedure: INSERTION OF LEFT UPPER ARM brachial to axillary artery ARTERIOVENOUS (AV) GORE-TEX GRAFT using 4-7 stretch graft;  Surgeon: Waynetta Sandy, MD;  Location: [facility];  Service: Vascular;  Laterality: Left;  . CYSTOSCOPY W/ RETROGRADES  11/16/2012   Procedure: CYSTOSCOPY WITH RETROGRADE PYELOGRAM;  Surgeon: Reece Packer, MD;  Location: [facility];  Service: Urology;  Laterality: Bilateral;  CYSTOSCOPY,BILATERAL RETROGRADE PYELOGRAM/ REMOVAL LEFT URETERAL STENT/ FULGERATION BLADDER MUCOSA/ INSERTION RIGHT URETERAL STENT  . CYSTOSCOPY W/ URETERAL STENT PLACEMENT  05/28/2012   Procedure: CYSTOSCOPY WITH RETROGRADE PYELOGRAM/URETERAL STENT PLACEMENT;  Surgeon: Reece Packer, MD;  Location: [facility];  Service: Urology;  Laterality: Left;  . CYSTOSCOPY WITH RETROGRADE PYELOGRAM, URETEROSCOPY AND STENT PLACEMENT Right 06/23/2013   Procedure: CYSTOSCOPY WITH RIGHT URETEROSCOPY, RIGHT  RETROGRADE PYELOGRAM, WITH LASER LIPOTRIPSY AND RIGHT URETERAL STENT EXCHANGE ;  Surgeon: Molli Hazard, MD;  Location: [facility];  Service: Urology;  Laterality: Right;  STENT EXCHANGE    . CYSTOSCOPY WITH RETROGRADE PYELOGRAM, URETEROSCOPY AND STENT PLACEMENT Right 11/06/2013   Procedure: CYSTOSCOPY WITH RETROGRADE PYELOGRAM, URETEROSCOPY STONE REMOVAL AND STENT REMOVAL;  Surgeon: Molli Hazard, MD;  Location: [facility];  Service: Urology;  Laterality: Right;  . HOLMIUM LASER APPLICATION N/A 12/21/5807   Procedure: HOLMIUM LASER APPLICATION;  Surgeon: Molli Hazard, MD;  Location: [facility];  Service: Urology;  Laterality: N/A;  . INSERTION OF DIALYSIS CATHETER N/A 09/24/2013   Procedure: INSERTION OF DIALYSIS CATHETER;  Surgeon: Rosetta Posner, MD;  [facility];  Service: Vascular;  Laterality: N/A;  . IR AV DIALY SHUNT INTRO NEEDLE/INTRAC  INITIAL W/PTA/STENT/IMG LT Left 08/13/2018  . IR AV DIALY SHUNT INTRO NEEDLE/INTRACATH INITIAL W/PTA/IMG LEFT  04/19/2018  . IR AV DIALY SHUNT INTRO NEEDLE/INTRACATH INITIAL W/PTA/IMG LEFT  08/14/2018  . IR FLUORO GUIDE CV LINE RIGHT  09/17/2018  . IR FLUORO GUIDE CV LINE RIGHT  09/20/2018  . IR US GUIDE VASC ACCESS LEFT  04/19/2018  . IR US GUIDE VASC ACCESS LEFT  08/13/2018  . IR US GUIDE VASC ACCESS LEFT  08/14/2018  . IR US GUIDE VASC ACCESS RIGHT  09/17/2018  . PORT-A-CATH REMOVAL    . PORTACATH PLACEMENT    . REVISON OF ARTERIOVENOUS FISTULA Left 07/25/2016   Procedure: REVISON OF LEFT ARTERIOVENOUS FISTULA;  Surgeon: Elam Dutch, MD;  Location: [facility];  Service: Vascular;  Laterality: Left;  . THROMBECTOMY AND REVISION OF ARTERIOVENTOUS (AV) GORETEX  GRAFT Left 10/10/2018   Procedure: attempted of THROMBECTOMY OF ARTERIOVENTOUS FISTULA LEFT ARM;  Surgeon: Waynetta Sandy, MD;  Location: [facility];  Service: Vascular;  Laterality: Left;     Allergies  Allergies  Allergen Reactions  . Rosiglitazone Other (See Comments)    "Avandia" = Reaction not recalled  . Amoxicillin Rash and Other (See Comments)    Tolerated Zosyn 01/2013. [place] Has [name] had a PCN reaction causing immediate rash, facial/tongue/throat swelling, SOB  or lightheadedness with hypotension: Yes Has [name] had a PCN reaction causing severe rash involving mucus membranes or skin necrosis: No Has [name] had a PCN reaction that required hospitalization: No Has [name] had a PCN reaction occurring within the last 10 years: No If all of the above answers are "NO", then may proceed with Cephalosporin use.   Marland Kitchen Amoxicillin-Pot Clavulanate Diarrhea    Inpatient Medications    . aspirin EC  81 mg Oral QHS  . b complex-vitamin c-folic acid  1 tablet Oral QHS  . calcium acetate  667 mg Oral TID WC  . Chlorhexidine Gluconate Cloth  6 each Topical Q0600  . DULoxetine  60 mg Oral Daily  . famotidine  20 mg  Oral QHS  . glipiZIDE  2.5 mg Oral QAC breakfast  . heparin  5,000 Units Subcutaneous Q8H  . levothyroxine  75 mcg Oral QAC breakfast  . mouth rinse  15 mL Mouth Rinse BID  . [START ON 03/26/2019] midodrine  10 mg Oral Once per day on Mon Wed Fri  . pravastatin  40 mg Oral QPM  . sevelamer carbonate  2,400 mg Oral TID WC  . sodium bicarbonate  50 mEq Intravenous Once  . sodium chloride flush  3 mL Intravenous Q12H  . sodium hypochlorite   Irrigation BID  . sodium polystyrene  15 g Oral Once  . sucroferric oxyhydroxide  1,000 mg Oral TID WC    Family History    Family History  Problem Relation Age of Onset  . Coronary artery disease Mother   . Hypertension Mother   . Diabetes type II Mother   . Coronary artery disease Father   . Hypertension Father   . Malignant hyperthermia Father   . Cancer Maternal Grandfather        ? Type  . Kidney failure Maternal Grandmother    She indicated that her mother is alive. She indicated that her father is deceased. She indicated that the status of her maternal grandmother is unknown. She indicated that the status of her maternal grandfather is unknown.   Social History    Social History   Socioeconomic History  . Marital status: Married    Spouse name: Not on file  . Number of children: Not on file  . Years of education: Not on file  . Highest education level: Not on file  Occupational History  . Not on file  Social Needs  . Financial resource strain: Not on file  . Food insecurity:    Worry: Not on file    Inability: Not on file  . Transportation needs:    Medical: Not on file    Non-medical: Not on file  Tobacco Use  . Smoking status: Former Smoker    Packs/day: 0.25    Years: 1.00    Pack years: 0.25    Types: Cigarettes    Last attempt to quit: 11/28/1979    Years since quitting: 39.3  . Smokeless tobacco: Never Used  Substance and Sexual Activity  . Alcohol use: No  . Drug use: No  . Sexual activity: Never    Birth  control/protection: Abstinence  Lifestyle  . Physical activity:    Days per week: Not on file    Minutes per session: Not on file  . Stress: Not on file  Relationships  . Social connections:    Talks on phone: Not on file    Gets together: Not on file    Attends religious service: Not  on file    Active member of club or organization: Not on file    Attends meetings of clubs or organizations: Not on file    Relationship status: Not on file  . Intimate partner violence:    Fear of current or ex partner: Not on file    Emotionally abused: Not on file    Physically abused: Not on file    Forced sexual activity: Not on file  Other Topics Concern  . Not on file  Social History Narrative  . Not on file     Review of Systems    Please see HPI. Otherwise, negative.  Physical Exam    Physical Exam per MD:  Blood pressure (!) 88/50, pulse 76, temperature 98.2 F (36.8 C), temperature source Oral, resp. rate 17, height 5\' 7"  (1.702 m), weight (!) 158.8 kg, last menstrual period 06/13/2013, SpO2 100 %.   See MD exam   Labs    Troponin (Point of Care Test) No results for input(s): TROPIPOC in the last 72 hours. Recent Labs    03/24/19 1656 03/24/19 1930 03/25/19 0729  CKTOTAL  --   --  225  CKMB  --   --  4.9  TROPONINI 0.14* 0.19* 0.19*   Lab Results  Component Value Date   WBC 26.3 (H) 03/25/2019   HGB 9.8 (L) 03/25/2019   HCT 33.8 (L) 03/25/2019   MCV 108.0 (H) 03/25/2019   PLT 248 03/25/2019    Recent Labs  Lab 03/25/19 0729  NA 132*  K 5.7*  CL 97*  CO2 20*  BUN 99*  CREATININE 13.80*  CALCIUM 9.2  PROT 8.3*  BILITOT 0.7  ALKPHOS 118  ALT 15  AST 21  GLUCOSE 149*   Lab Results  Component Value Date   CHOL 80 01/20/2012   HDL 28 (L) 01/20/2012   LDLCALC 41 01/20/2012   TRIG 55 01/20/2012   Lab Results  Component Value Date   DDIMER (H) 07/11/2007    1.11        AT THE INHOUSE ESTABLISHED CUTOFF VALUE OF 0.48 ug/mL FEU, THIS ASSAY HAS BEEN  DOCUMENTED IN THE LITERATURE TO HAVE     Radiology Studies    Dg Chest Port 1 View  Result Date: 03/24/2019 CLINICAL DATA:  58 year old female with shortness of breath EXAM: PORTABLE CHEST 1 VIEW COMPARISON:  01/29/2019, 01/27/2019, 09/18/2018 FINDINGS: Cardiomediastinal silhouette likely unchanged. Right rotation of the [name]. Unchanged right IJ hemodialysis catheter. No pneumothorax.  No large pleural effusion. Low lung volumes. Similar appearance of thickening of the minor fissure. No interlobular septal thickening. Hazy opacity on the left is related to the chest wall soft tissues and right rotation. IMPRESSION: No evidence of acute cardiopulmonary disease with low lung volumes and unchanged right IJ hemodialysis catheter Electronically Signed   By: Corrie Mckusick D.O.   On: 03/24/2019 18:41    EKG     EKG: EKG was personally reviewed and demonstrates: NSR 90 No ST-T wave abnormalities. Low volts Personally reviewed Telemetry: Telemetry was personally reviewed and demonstrates: NSR 80-90s Personally reviewed   Cardiac Imaging    Echocardiogram 01/28/2019: 1. The left ventricle has hyperdynamic systolic function, with an ejection fraction of >65%. The cavity size was normal. Left ventricular diastology could not be evaluated.  2. The right ventricle has normal systolic function. The cavity was normal. There is no increase in right ventricular wall thickness.  3. The mitral valve is normal in structure.  4. The tricuspid valve  is normal in structure.  5. The aortic valve is normal in structure.  6. The pulmonic valve was normal in structure.  Assessment & Plan    Acute Respiratory Distress - [name] presented with worsening shortness of breath from baseline, cough, and fever. - COVID-19 testing negative but [name] noted to have leukocytosis. - [name] missed dialysis session yesterday. Differential includes acute on chronic CHF, COPD exacerbation, and possible pneumonia.  Possibly  Acute on Chronic Diastolic CHF - Recent Echo from 01/2019 showed LVEF of >65%. - Chest x-ray showed no signs of pulmonary edema. - BNP mildly elevated in 200's range. - Volume status managed with dialysis. [name] missed dialysis session yesterday because she was not feeling well.  Paroxysmal Atrial Fibrillation - Rates currently well controlled.  - [name] on Cardizem 30mg  four times daily on an as needed basis at home. She is unable to tolerate long-acting calcium channel blockers or beta-blockers due to hypotension with dialysis. - CHA2DS2-VASc = at least 4 (CHF, HTN, DM, gender). However, not on any chronic anticoagulation due to open lesions secondary to hydradenitis suppurativa which bleed constantly.  Elevated Troponin - Troponin minimally elevated and flat at 0.14 >> 0.19 >> 0.19. Not consistent with ACS. Suspect demand ischemia due to acute respiratory distress in addition to ESRD.  - [name] denies any chest pain.  - Do not anticipate any ischemic evaluation at this time.  ESRD on Hemodialysis - Hemodialysis on M/W/F. [name] reportedly missed session yesterday because she was not feeling well. - On Midodrine 10mg  on dialysis days due to hypotension. - Management per primary team.  Hypotensive - BP soft with systolic BP in the 70'V to 90's. - [name] on Midodrine 10mg  daily on dialysis days due to hypotension. May consider giving a dose today if BP remains low.  Leukocytosis  - WBC 28.9 on admission. Appears to be chronically elevated in the 20's to 30's since early March 2020. - Lactic acid normal. - Blood cultures have been ordered. - Primary team started empiric antibiotics.   Otherwise, per primary team.  Signed, Darreld Mclean, PA-C 03/25/2019, 10:42 AM Pager: [phone] For questions or updates, please contact   Please consult [email or web address] for contact info under Cardiology/STEMI.   [name] seen and examined with the above-signed Advanced Practice Provider  and/or Housestaff. I personally reviewed laboratory data, imaging studies and relevant notes. I independently examined the [name] and formulated the important aspects of the plan. I have edited the note to reflect any of my changes or salient points. I have personally discussed the plan with the [name] and/or family.  58 y/o woman with multiple medical problems including morbid obesity, ESRD on HD, DM2, HTN.   Admitted for increasing SOB and orthopnea after missing HD. Denies CP. Labs with creatinine 13.8  BUN 99. Trop minimally elevated in 0.10 range and is flat. ECG normal. Echo 3/20 was normal.   Currently volume overloaded and SOB. Also mildly uremic.  R sided perm cath Hard to see JVP with size HEENT normal Neck supplle carotids 2+ no bruits no LAD>  Cor RRR  Lung minimal basilar crackles Ab markedly obese Ext warm trace edema. Wound on back or right thigh Neuro. Mildly confused but moves all 4   While she may have underlying CAD currently no evidence of ACS or angina. Suspect main issue is volume overload in setting of missing HD. She will need HD and given dyspnea, hyperkalemia and probable mild uremia would suggest doing this prior to d/c but she is  currently refusing. No further cardiac w/u at this time. D/w Dr. Eliseo Squires. Continue aggressive RF modification.   We will sign off.   Glori Bickers, MD  12:24 PM

## 2019-03-25 NOTE — Discharge Summary (Signed)
Physician Discharge Summary  [name] HXT:[id number] DOB: 1961-05-22 DOA: 03/24/2019  PCP: Harlan Stains, MD  Admit date: 03/24/2019 Discharge date: 03/25/2019  Time spent: 45 minutes  Recommendations for Outpatient Follow-up:  1. Dialysis tomorrow as scheduled 2. Take medications as directed 3. Follow up with Infection disease 1-2 weeks   Discharge Diagnoses:  Principal Problem:   Respiratory distress Active Problems:   ESRD (end stage renal disease) on dialysis (HCC)   Elevated troponin   Morbid obesity (HCC)   DM type 2 (diabetes mellitus, type 2) (HCC)   Hidradenitis suppurativa   Chronic diastolic heart failure (HCC)   OSA (obstructive sleep apnea)   COPD (chronic obstructive pulmonary disease) (HCC)   Chronic respiratory failure (HCC)   Persistent atrial fibrillation   Pressure injury of skin   Discharge Condition: stable  Diet recommendation: renal heart healthy  Filed Weights   03/25/19 0000  Weight: (!) 158.8 kg    History of present illness:  [name]  is a 58 y.o. female,w hypertension, hyperlipidemia, Dm2, , ESRD on HD (M,W,F),Pafib, CHF (diastolic) apparently presented 03/24/19 w c/o dyspnea for the past 1-2 days.  Pt noted slight dry cough, and fever to 101,  ? Slight orthopnea, but unclear about weight change.  Pt denied cp, palp, n/v, abd pain, diarrhea, brbpr, black stool.  Pt missed her dialysis that day due to coming to ER for evaluation of dyspnea.    Hospital Course:   Respiratory distress with increased oxygen demand unclear etiology. Concern for volume overload as she missed dialysis yesterday in setting of elevated troponin and hidradenitis flare. Fever, worsening leukocytosis troponin trending up. BNP slightly elevated. Chest xray with no evidence of acute cardiopulmonary disease with low lung volumes.  Refusing CT chest as cannot lay flat. Usually only wears oxygen at night and has needed it 24/7 for last 3 days. No events on tele.  Patient reports her breathing is better. Neurology recommends dialysis but patient refusing. Patient has appointment for tomorrow. Continue cefepime in dialysis per nephrology.  Leukocytosis, unclear etiology. Likely  Hidradenitis flare.  Chart review indicates some chronic component. Current level a little higher than baseline.  Reports fever 101 2 days ago. Chest xray as noted above. Refusing CT chest. Foul odor from wounds. Abdomen tight with erythema and heat as well as tenderness.  Vancomycin and cefepime started. Will discharge with Flagyl TID for 2 weeks and cefepime during dialysis. Will need follow up with ID. Encouraged patient to follow up with ID. Blood cultures negative to date  ESRD on HD (M, W, F) missed dialysis yesterday. Will resume dialysis tomorrow per regular schedule. Patient insisting of going home  Mild Hyperkalemia w/o ekg changes. Potassium 5.7 at discharge. Dialysis tomorrow  Elevated troponin. Likely related to ESRD in setting of #1. No chest pain. Evaluated by cardiology who opined not consistent with ACS and likely demand ischemia. Recommend no further ischemic evaluation  Dm2 Cont Glucotrol fsbs ac and qhs, ISS  Hypertension/ h/o CHF (diastolic), Pafib. BP soft. Home meds include Midodrine. Asymptomatic. Echo from 3/20 with EF 65% chest xray without pulmonary edema, BNP 277. Home meds include cardizem prn as BP cannot tolerate long term. No anticoagulation due to open lesions secondary to hydradentis suppurativa.  Hidradenitis supprativa Cont Dakins  Hypothyroidism Cont Levothyroxine 75 micrograms po qday  Hyperlipidemia Cont Pravastatin 40mg  po qhs  Procedures:  Consultations:  [facility] cardiology  [facility] (phone)  Tommy Medal ID (phone  Discharge Exam: Vitals:   03/25/19  8099 03/25/19 1207  BP: (!) 88/50 (!) 88/48  Pulse: 76 80  Resp: 17 (!) 22  Temp: 98.2 F (36.8 C) 98.2 F (36.8 C)  SpO2: 100% 98%    General: obese  lying in bed awake alert no acute distress HOH Cardiovascular: rrr HS distant no mgr No LE edema Respiratory: mild increased work of breathing with conversation. BS distant but fine crackles bilateral bases no wheeze  Discharge Instructions   Discharge Instructions    Call MD for:  difficulty breathing, headache or visual disturbances   Complete by:  As directed    Call MD for:  severe uncontrolled pain   Complete by:  As directed    Call MD for:  temperature >100.4   Complete by:  As directed    Diet - low sodium heart healthy   Complete by:  As directed    Discharge instructions   Complete by:  As directed    Take medications as prescribed Follow dialysis as scheduled Follow up with Infection Disease clinic 1-2 weeks   Increase activity slowly   Complete by:  As directed      Allergies as of 03/25/2019      Reactions   Rosiglitazone Other (See Comments)   "Avandia" = Reaction not recalled   Amoxicillin Rash, Other (See Comments)   Tolerated Zosyn 01/2013. [place] Has patient had a PCN reaction causing immediate rash, facial/tongue/throat swelling, SOB or lightheadedness with hypotension: Yes Has patient had a PCN reaction causing severe rash involving mucus membranes or skin necrosis: No Has patient had a PCN reaction that required hospitalization: No Has patient had a PCN reaction occurring within the last 10 years: No If all of the above answers are "NO", then may proceed with Cephalosporin use.   Amoxicillin-pot Clavulanate Diarrhea      Medication List    TAKE these medications   acetaminophen 500 MG tablet Commonly known as:  TYLENOL Take 1,000 mg by mouth daily as needed for moderate pain.   aspirin EC 81 MG tablet Take 81 mg by mouth at bedtime.   Calcium Acetate 667 MG Tabs Take 667 mg by mouth See admin instructions. Take 667 mg by mouth three times a day with meals and 667 mg with each snack   ceFEPIme 2 g in sodium chloride 0.9 % 100 mL Inject 2 g  into the vein every Monday, Wednesday, and Friday with hemodialysis. Start taking on:  March 26, 2019   diltiazem 30 MG tablet Commonly known as:  Cardizem Take 1 tablet every 4 hours AS NEEDED for rapid heart rate lasting more than 30 mins as long as top blood pressure >100. What changed:    how much to take  how to take this  when to take this  reasons to take this  additional instructions   diphenhydrAMINE 25 MG tablet Commonly known as:  BENADRYL Take 25 mg by mouth daily as needed for allergies.   DULoxetine 60 MG capsule Commonly known as:  Cymbalta Take 1 capsule (60 mg total) by mouth daily.   famotidine 20 MG tablet Commonly known as:  PEPCID TAKE 1 TABLET BY MOUTH AT BEDTIME   feeding supplement Liqd Take 1 Container by mouth daily as needed (for meal supplementation).   glipiZIDE 5 MG tablet Commonly known as:  GLUCOTROL Take 0.5 tablets (2.5 mg total) by mouth daily.   levothyroxine 75 MCG tablet Commonly known as:  SYNTHROID Take 75 mcg by mouth daily before breakfast.  metroNIDAZOLE 500 MG tablet Commonly known as:  FLAGYL Take 1 tablet (500 mg total) by mouth 3 (three) times daily for 14 days. What changed:  when to take this   midodrine 10 MG tablet Commonly known as:  PROAMATINE Take 1 tablet (10 mg total) by mouth 3 (three) times daily. What changed:    when to take this  additional instructions   multivitamin with minerals Tabs tablet Take 1 tablet by mouth daily.   NEPHRO-VITE PO Take 1 tablet by mouth daily.   oxyCODONE-acetaminophen 5-325 MG tablet Commonly known as:  PERCOCET/ROXICET Take 1-2 tablets by mouth every 6 (six) hours as needed. Patient has not taken since started dialysis 08/2013 due to causes hypotension What changed:    how much to take  reasons to take this  additional instructions   pravastatin 40 MG tablet Commonly known as:  PRAVACHOL Take 40 mg by mouth every evening.   PROBIOTIC DAILY PO Take 1  capsule by mouth 2 (two) times daily.   sevelamer carbonate 800 MG tablet Commonly known as:  RENVELA Take 2,400 mg by mouth 3 (three) times daily with meals.   sodium hypochlorite 0.25 % Soln Apply 1 application topically See admin instructions. Apply to wounds as directed 2 times daily   Velphoro 500 MG chewable tablet Generic drug:  sucroferric oxyhydroxide Chew 500-1,000 mg by mouth See admin instructions. Chew 1,000 mg by mouth with each meal and 500 mg with each snack      Allergies  Allergen Reactions  . Rosiglitazone Other (See Comments)    "Avandia" = Reaction not recalled  . Amoxicillin Rash and Other (See Comments)    Tolerated Zosyn 01/2013. [place] Has patient had a PCN reaction causing immediate rash, facial/tongue/throat swelling, SOB or lightheadedness with hypotension: Yes Has patient had a PCN reaction causing severe rash involving mucus membranes or skin necrosis: No Has patient had a PCN reaction that required hospitalization: No Has patient had a PCN reaction occurring within the last 10 years: No If all of the above answers are "NO", then may proceed with Cephalosporin use.   Marland Kitchen Amoxicillin-Pot Clavulanate Diarrhea   Follow-up Information    Harlan Stains, MD.   Specialty:  Family Medicine Why:  Call for appointment Contact information: [place] [address] [phone]            The results of significant diagnostics from this hospitalization (including imaging, microbiology, ancillary and laboratory) are listed below for reference.    Significant Diagnostic Studies: Dg Chest Port 1 View  Result Date: 03/24/2019 CLINICAL DATA:  58 year old female with shortness of breath EXAM: PORTABLE CHEST 1 VIEW COMPARISON:  01/29/2019, 01/27/2019, 09/18/2018 FINDINGS: Cardiomediastinal silhouette likely unchanged. Right rotation of the patient. Unchanged right IJ hemodialysis catheter. No pneumothorax.  No large pleural  effusion. Low lung volumes. Similar appearance of thickening of the minor fissure. No interlobular septal thickening. Hazy opacity on the left is related to the chest wall soft tissues and right rotation. IMPRESSION: No evidence of acute cardiopulmonary disease with low lung volumes and unchanged right IJ hemodialysis catheter Electronically Signed   By: Corrie Mckusick D.O.   On: 03/24/2019 18:41    Microbiology: Recent Results (from the past 240 hour(s))  SARS Coronavirus 2 [facility] order, Performed in [facility] hospital lab)     Status: None   Collection Time: 03/24/19  4:27 PM  Result Value Ref Range Status   SARS Coronavirus 2 NEGATIVE NEGATIVE Final  Comment: (NOTE) If result is NEGATIVE SARS-CoV-2 target nucleic acids are NOT DETECTED. The SARS-CoV-2 RNA is generally detectable in upper and lower  respiratory specimens during the acute phase of infection. The lowest  concentration of SARS-CoV-2 viral copies this assay can detect is 250  copies / mL. A negative result does not preclude SARS-CoV-2 infection  and should not be used as the sole basis for treatment or other  patient management decisions.  A negative result may occur with  improper specimen collection / handling, submission of specimen other  than nasopharyngeal swab, presence of viral mutation(s) within the  areas targeted by this assay, and inadequate number of viral copies  (<250 copies / mL). A negative result must be combined with clinical  observations, patient history, and epidemiological information. If result is POSITIVE SARS-CoV-2 target nucleic acids are DETECTED. The SARS-CoV-2 RNA is generally detectable in upper and lower  respiratory specimens dur ing the acute phase of infection.  Positive  results are indicative of active infection with SARS-CoV-2.  Clinical  correlation with patient history and other diagnostic information is  necessary to determine patient infection status.  Positive results do   not rule out bacterial infection or co-infection with other viruses. If result is PRESUMPTIVE POSTIVE SARS-CoV-2 nucleic acids MAY BE PRESENT.   A presumptive positive result was obtained on the submitted specimen  and confirmed on repeat testing.  While 2019 novel coronavirus  (SARS-CoV-2) nucleic acids may be present in the submitted sample  additional confirmatory testing may be necessary for epidemiological  and / or clinical management purposes  to differentiate between  SARS-CoV-2 and other Sarbecovirus currently known to infect humans.  If clinically indicated additional testing with an alternate test  methodology [phone]) is advised. The SARS-CoV-2 RNA is generally  detectable in upper and lower respiratory sp ecimens during the acute  phase of infection. The expected result is Negative. Fact Sheet for Patients:  StrictlyIdeas.no Fact Sheet for Healthcare Providers: BankingDealers.co.za This test is not yet approved or cleared by the [place] FDA and has been authorized for detection and/or diagnosis of SARS-CoV-2 by FDA under an Emergency Use Authorization (EUA).  This EUA will remain in effect (meaning this test can be used) for the duration of the COVID-19 declaration under Section 564(b)(1) of the Act, 21 U.S.C. section 360bbb-3(b)(1), unless the authorization is terminated or revoked sooner. Performed at [facility], [place] [address] [place], [place] [address]   Blood culture (routine x 2)     Status: None (Preliminary result)   Collection Time: 03/24/19  5:08 PM  Result Value Ref Range Status   Specimen Description BLOOD LEFT FOREARM  Final   Special Requests   Final    BOTTLES DRAWN AEROBIC AND ANAEROBIC Blood Culture adequate volume   Culture   Final    NO GROWTH < 24 HOURS Performed at [facility], [place] [address] [place], [place] [address]    Report Status PENDING  Incomplete  Blood culture  (routine x 2)     Status: None (Preliminary result)   Collection Time: 03/24/19  5:40 PM  Result Value Ref Range Status   Specimen Description BLOOD LEFT HAND  Final   Special Requests   Final    BOTTLES DRAWN AEROBIC AND ANAEROBIC Blood Culture adequate volume   Culture   Final    NO GROWTH < 24 HOURS Performed at [facility], [place] [address] [place], [place] [address]  Report Status PENDING  Incomplete     Labs: Basic Metabolic Panel: Recent Labs  Lab 03/24/19 1656 03/25/19 0729  NA 133* 132*  K 5.5* 5.7*  CL 99 97*  CO2 21* 20*  GLUCOSE 130* 149*  BUN 90* 99*  CREATININE 12.52* 13.80*  CALCIUM 9.3 9.2   Liver Function Tests: Recent Labs  Lab 03/25/19 0729  AST 21  ALT 15  ALKPHOS 118  BILITOT 0.7  PROT 8.3*  ALBUMIN 2.4*   No results for input(s): LIPASE, AMYLASE in the last 168 hours. No results for input(s): AMMONIA in the last 168 hours. CBC: Recent Labs  Lab 03/24/19 1656 03/25/19 0729  WBC 28.9* 26.3*  NEUTROABS 26.9* 24.2*  HGB 10.1* 9.8*  HCT 36.1 33.8*  MCV 107.8* 108.0*  PLT 289 248   Cardiac Enzymes: Recent Labs  Lab 03/24/19 1656 03/24/19 1930 03/25/19 0729  CKTOTAL  --   --  225  CKMB  --   --  4.9  TROPONINI 0.14* 0.19* 0.19*   BNP: BNP (last 3 results) Recent Labs    01/28/19 0214 03/24/19 1656  BNP 250.6* 277.2*    ProBNP (last 3 results) No results for input(s): PROBNP in the last 8760 hours.  CBG: Recent Labs  Lab 03/24/19 1605 03/25/19 0037 03/25/19 0603 03/25/19 1203  GLUCAP 141* 159* 168* 125*       Signed:  Radene Gunning NP [facility] 03/25/2019, 1:02 PM

## 2019-03-26 DIAGNOSIS — N2581 Secondary hyperparathyroidism of renal origin: Secondary | ICD-10-CM | POA: Diagnosis not present

## 2019-03-26 DIAGNOSIS — D509 Iron deficiency anemia, unspecified: Secondary | ICD-10-CM | POA: Diagnosis not present

## 2019-03-26 DIAGNOSIS — D631 Anemia in chronic kidney disease: Secondary | ICD-10-CM | POA: Diagnosis not present

## 2019-03-26 DIAGNOSIS — L732 Hidradenitis suppurativa: Secondary | ICD-10-CM | POA: Diagnosis not present

## 2019-03-26 DIAGNOSIS — N186 End stage renal disease: Secondary | ICD-10-CM | POA: Diagnosis not present

## 2019-03-26 DIAGNOSIS — E1129 Type 2 diabetes mellitus with other diabetic kidney complication: Secondary | ICD-10-CM | POA: Diagnosis not present

## 2019-03-27 DIAGNOSIS — N186 End stage renal disease: Secondary | ICD-10-CM | POA: Diagnosis not present

## 2019-03-27 DIAGNOSIS — N2581 Secondary hyperparathyroidism of renal origin: Secondary | ICD-10-CM | POA: Diagnosis not present

## 2019-03-27 DIAGNOSIS — E1129 Type 2 diabetes mellitus with other diabetic kidney complication: Secondary | ICD-10-CM | POA: Diagnosis not present

## 2019-03-27 DIAGNOSIS — D509 Iron deficiency anemia, unspecified: Secondary | ICD-10-CM | POA: Diagnosis not present

## 2019-03-27 DIAGNOSIS — L732 Hidradenitis suppurativa: Secondary | ICD-10-CM | POA: Diagnosis not present

## 2019-03-27 DIAGNOSIS — D631 Anemia in chronic kidney disease: Secondary | ICD-10-CM | POA: Diagnosis not present

## 2019-03-28 DIAGNOSIS — N2581 Secondary hyperparathyroidism of renal origin: Secondary | ICD-10-CM | POA: Diagnosis not present

## 2019-03-28 DIAGNOSIS — N186 End stage renal disease: Secondary | ICD-10-CM | POA: Diagnosis not present

## 2019-03-28 DIAGNOSIS — Z992 Dependence on renal dialysis: Secondary | ICD-10-CM | POA: Diagnosis not present

## 2019-03-28 DIAGNOSIS — D631 Anemia in chronic kidney disease: Secondary | ICD-10-CM | POA: Diagnosis not present

## 2019-03-28 DIAGNOSIS — E1129 Type 2 diabetes mellitus with other diabetic kidney complication: Secondary | ICD-10-CM | POA: Diagnosis not present

## 2019-03-28 DIAGNOSIS — L732 Hidradenitis suppurativa: Secondary | ICD-10-CM | POA: Diagnosis not present

## 2019-03-29 LAB — CULTURE, BLOOD (ROUTINE X 2)
Culture: NO GROWTH
Culture: NO GROWTH
Special Requests: ADEQUATE
Special Requests: ADEQUATE

## 2019-03-31 ENCOUNTER — Telehealth: Payer: Self-pay | Admitting: *Deleted

## 2019-03-31 ENCOUNTER — Encounter (HOSPITAL_COMMUNITY): Payer: Self-pay | Admitting: Physician Assistant

## 2019-03-31 DIAGNOSIS — L732 Hidradenitis suppurativa: Secondary | ICD-10-CM | POA: Diagnosis not present

## 2019-03-31 DIAGNOSIS — E1129 Type 2 diabetes mellitus with other diabetic kidney complication: Secondary | ICD-10-CM | POA: Diagnosis not present

## 2019-03-31 DIAGNOSIS — N2581 Secondary hyperparathyroidism of renal origin: Secondary | ICD-10-CM | POA: Diagnosis not present

## 2019-03-31 DIAGNOSIS — N186 End stage renal disease: Secondary | ICD-10-CM | POA: Diagnosis not present

## 2019-03-31 DIAGNOSIS — D631 Anemia in chronic kidney disease: Secondary | ICD-10-CM | POA: Diagnosis not present

## 2019-03-31 NOTE — Telephone Encounter (Signed)
thx so much MIchelle!

## 2019-03-31 NOTE — Telephone Encounter (Signed)
Patient's mother called. Laraina was hospitalized last week with fever/hidradenitis flare, tested Covid negative. She was discharged with orders for cefepime during dialysis, was given flagyl 500 mg TID at discharge. Per mother, her fever returned again today 100.8 and she is not sure dialysis will see Afsheen for her Monday treatment. She is asking if Jamya's medication should be changed so dialysis can see her or if she should go to [facility].  Per Dr Tommy Medal, RN called [facility], relayed verbal order to add vancomycin 1 gm after dialysis x 2 weeks to [place], Therapist, sports.  Russelle repeated and verified additional order, relayed to Dr Deterding for permission to continue dialysis as scheduled.  Permission received, this RN called Treva's mother to go ahead and bring her to dialysis as scheduled. Faxed order to [facility] at [phone]. Landis Gandy, RN

## 2019-03-31 NOTE — Progress Notes (Signed)
I notified Zigmund Daniel, Dr Nicole Cella RN of patient's recent hospitalization with shortness of breath,  elevated temp, negative covid test, Zigmund Daniel will review.

## 2019-03-31 NOTE — Progress Notes (Signed)
I spoke with patient's mother, Mrs. Gerome Sam, she reports that Mrs Sheriff  Has a temp, was 100.8, now 100.4.  Mrs Gerome Sam has a call in to PCP's office.  We will follow up after she speaks with PCP.

## 2019-04-01 ENCOUNTER — Encounter (HOSPITAL_COMMUNITY)
Admission: RE | Disposition: A | Discharge status: Home / Self Care | Payer: Self-pay | Source: Home / Self Care | Attending: Vascular Surgery

## 2019-04-01 ENCOUNTER — Ambulatory Visit (HOSPITAL_COMMUNITY)
Admission: RE | Admit: 2019-04-01 | Discharge: 2019-04-03 | Disposition: A | Discharge status: Home / Self Care | Payer: Medicare Other | Attending: Vascular Surgery | Admitting: Vascular Surgery

## 2019-04-01 SURGERY — ARTERIOVENOUS (AV) FISTULA CREATION
Anesthesia: Choice | Laterality: Right

## 2019-04-03 ENCOUNTER — Emergency Department (HOSPITAL_COMMUNITY): Payer: Medicare Other

## 2019-04-03 ENCOUNTER — Other Ambulatory Visit: Payer: Self-pay

## 2019-04-03 ENCOUNTER — Telehealth: Payer: Self-pay | Admitting: Surgery

## 2019-04-03 ENCOUNTER — Emergency Department (HOSPITAL_COMMUNITY)
Admission: EM | Admit: 2019-04-03 | Discharge: 2019-04-03 | Discharge status: Against Medical Advice | Payer: Medicare Other | Source: Home / Self Care | Attending: Emergency Medicine | Admitting: Emergency Medicine

## 2019-04-03 ENCOUNTER — Telehealth: Payer: Self-pay | Admitting: *Deleted

## 2019-04-03 DIAGNOSIS — Z5320 Procedure and treatment not carried out because of patient's decision for unspecified reasons: Secondary | ICD-10-CM | POA: Insufficient documentation

## 2019-04-03 DIAGNOSIS — I132 Hypertensive heart and chronic kidney disease with heart failure and with stage 5 chronic kidney disease, or end stage renal disease: Secondary | ICD-10-CM | POA: Insufficient documentation

## 2019-04-03 DIAGNOSIS — E039 Hypothyroidism, unspecified: Secondary | ICD-10-CM | POA: Insufficient documentation

## 2019-04-03 DIAGNOSIS — U071 COVID-19: Secondary | ICD-10-CM

## 2019-04-03 DIAGNOSIS — Z7984 Long term (current) use of oral hypoglycemic drugs: Secondary | ICD-10-CM | POA: Insufficient documentation

## 2019-04-03 DIAGNOSIS — A4189 Other specified sepsis: Secondary | ICD-10-CM | POA: Diagnosis not present

## 2019-04-03 DIAGNOSIS — R6521 Severe sepsis with septic shock: Secondary | ICD-10-CM | POA: Diagnosis not present

## 2019-04-03 DIAGNOSIS — Z79899 Other long term (current) drug therapy: Secondary | ICD-10-CM | POA: Insufficient documentation

## 2019-04-03 DIAGNOSIS — Z87891 Personal history of nicotine dependence: Secondary | ICD-10-CM | POA: Insufficient documentation

## 2019-04-03 DIAGNOSIS — E1122 Type 2 diabetes mellitus with diabetic chronic kidney disease: Secondary | ICD-10-CM | POA: Insufficient documentation

## 2019-04-03 DIAGNOSIS — R918 Other nonspecific abnormal finding of lung field: Secondary | ICD-10-CM | POA: Diagnosis not present

## 2019-04-03 DIAGNOSIS — Z7982 Long term (current) use of aspirin: Secondary | ICD-10-CM | POA: Insufficient documentation

## 2019-04-03 DIAGNOSIS — N186 End stage renal disease: Secondary | ICD-10-CM | POA: Insufficient documentation

## 2019-04-03 DIAGNOSIS — J189 Pneumonia, unspecified organism: Secondary | ICD-10-CM | POA: Diagnosis not present

## 2019-04-03 DIAGNOSIS — R509 Fever, unspecified: Secondary | ICD-10-CM

## 2019-04-03 DIAGNOSIS — Z992 Dependence on renal dialysis: Secondary | ICD-10-CM | POA: Insufficient documentation

## 2019-04-03 DIAGNOSIS — J9601 Acute respiratory failure with hypoxia: Secondary | ICD-10-CM | POA: Diagnosis not present

## 2019-04-03 DIAGNOSIS — J80 Acute respiratory distress syndrome: Secondary | ICD-10-CM | POA: Diagnosis not present

## 2019-04-03 DIAGNOSIS — I5032 Chronic diastolic (congestive) heart failure: Secondary | ICD-10-CM | POA: Insufficient documentation

## 2019-04-03 DIAGNOSIS — J1289 Other viral pneumonia: Secondary | ICD-10-CM | POA: Diagnosis not present

## 2019-04-03 DIAGNOSIS — I120 Hypertensive chronic kidney disease with stage 5 chronic kidney disease or end stage renal disease: Secondary | ICD-10-CM | POA: Diagnosis not present

## 2019-04-03 DIAGNOSIS — R0602 Shortness of breath: Secondary | ICD-10-CM | POA: Diagnosis not present

## 2019-04-03 LAB — SARS CORONAVIRUS 2 BY RT PCR (HOSPITAL ORDER, PERFORMED IN ~~LOC~~ HOSPITAL LAB): SARS Coronavirus 2: POSITIVE — AB

## 2019-04-03 LAB — CBC WITH DIFFERENTIAL/PLATELET
Abs Immature Granulocytes: 0.17 10*3/uL — ABNORMAL HIGH (ref 0.00–0.07)
Basophils Absolute: 0 10*3/uL (ref 0.0–0.1)
Basophils Relative: 0 %
Eosinophils Absolute: 0 10*3/uL (ref 0.0–0.5)
Eosinophils Relative: 0 %
HCT: 31.3 % — ABNORMAL LOW (ref 36.0–46.0)
Hemoglobin: 8.7 g/dL — ABNORMAL LOW (ref 12.0–15.0)
Immature Granulocytes: 2 %
Lymphocytes Relative: 12 %
Lymphs Abs: 1.1 10*3/uL (ref 0.7–4.0)
MCH: 30.7 pg (ref 26.0–34.0)
MCHC: 27.8 g/dL — ABNORMAL LOW (ref 30.0–36.0)
MCV: 110.6 fL — ABNORMAL HIGH (ref 80.0–100.0)
Monocytes Absolute: 0.6 10*3/uL (ref 0.1–1.0)
Monocytes Relative: 7 %
Neutro Abs: 6.8 10*3/uL (ref 1.7–7.7)
Neutrophils Relative %: 79 %
Platelets: 237 10*3/uL (ref 150–400)
RBC: 2.83 MIL/uL — ABNORMAL LOW (ref 3.87–5.11)
RDW: 16.1 % — ABNORMAL HIGH (ref 11.5–15.5)
WBC: 8.7 10*3/uL (ref 4.0–10.5)
nRBC: 0 % (ref 0.0–0.2)

## 2019-04-03 LAB — COMPREHENSIVE METABOLIC PANEL
ALT: 12 U/L (ref 0–44)
AST: 20 U/L (ref 15–41)
Albumin: 2.3 g/dL — ABNORMAL LOW (ref 3.5–5.0)
Alkaline Phosphatase: 78 U/L (ref 38–126)
Anion gap: 16 — ABNORMAL HIGH (ref 5–15)
BUN: 75 mg/dL — ABNORMAL HIGH (ref 6–20)
CO2: 21 mmol/L — ABNORMAL LOW (ref 22–32)
Calcium: 8.5 mg/dL — ABNORMAL LOW (ref 8.9–10.3)
Chloride: 99 mmol/L (ref 98–111)
Creatinine, Ser: 12.35 mg/dL — ABNORMAL HIGH (ref 0.44–1.00)
GFR calc Af Amer: 3 mL/min — ABNORMAL LOW (ref >60)
GFR calc non Af Amer: 3 mL/min — ABNORMAL LOW (ref >60)
Glucose, Bld: 155 mg/dL — ABNORMAL HIGH (ref 70–99)
Potassium: 4.2 mmol/L (ref 3.5–5.1)
Sodium: 136 mmol/L (ref 135–145)
Total Bilirubin: 0.5 mg/dL (ref 0.3–1.2)
Total Protein: 8 g/dL (ref 6.5–8.1)

## 2019-04-03 LAB — LIPASE, BLOOD: Lipase: 82 U/L — ABNORMAL HIGH (ref 11–51)

## 2019-04-03 LAB — LACTIC ACID, PLASMA: Lactic Acid, Venous: 0.9 mmol/L (ref 0.5–1.9)

## 2019-04-03 MED ORDER — FENTANYL CITRATE (PF) 100 MCG/2ML IJ SOLN: 50.0000 ug | Freq: Once | INTRAMUSCULAR | Status: DC

## 2019-04-03 NOTE — ED Notes (Signed)
Pt refused VS prior to DC

## 2019-04-03 NOTE — Telephone Encounter (Signed)
Patient's mother called. She arrived at [name]'s home to help get her ready for dialysis today, found her in a "pool of blood." She feels that one of her wounds ruptured on her buttocks. She describes it as "about 4 hands wide, red blood with a large dark clot in the middle." Per her mother, the bleeding has stopped now. She did not go to dialysis yesterday, is supposed to go today at 12:30. She has had a fever this week (ranging from 99 - 102, 99 this morning).  Per her mother, dialysis put her on an aggressive bowel protocol to see if that would help. Patient is currently on the toilet trying to have a bowel movement. She denies dizziness, difficulty breathing at this time. She is planning to try to keep her dialysis appointment today where she will get her 2nd dose of vancomycin, but will call 911 if her symptoms change. RN spoke with Dr Tommy Medal. He is in agreement with this plan.  RN checked back with [name]'s mother. [name] is no longer bleeding, but her temperature is rising.  Dialysis will not see her, is recommending she go to the ER.  Her mother is trying to convince [name] of this plan. They will go to [facility] ER. Landis Gandy, RN

## 2019-04-03 NOTE — ED Triage Notes (Signed)
Pt arrived form home with c/o fever for a couple of days.  Pt sent by dialysis clinic

## 2019-04-03 NOTE — Telephone Encounter (Signed)
ED CM was consulted by [name] in ED concerning patient who signed out AMA in the ED after testing positive for Covid-19.  Nurse was concerned that patient understanding was limited for Covid Isolation. CM contacted patient by phone with HIPPA compliant call, patient also placed husband on call as well.  CM reviewed the importance of Covid Isolation at Home, and when to seek medical attention. CM  provided [facility] number as well should any additional questions  Should arise. Patient verbalized understanding and appreciation for the information teach back done.   [name] RN BSN CM  04/03/19  18:30

## 2019-04-03 NOTE — ED Notes (Signed)
In to discuss discharge procedures w/ pt who is Covid-19 positive. Advised pt that she and ANYONE ELSE WHO LIVES WITH HER must self quarantine and leave the house as she is Covid positive. Pt states "I can't do that, I need to go to dialysis and my husband still has to go to work". Educated pt that husband cannot work at this time d/t risk of infecting others. Pt states "He's still going to work, you can't stop him". Further education provided, pt refused to sign quarantine contract and refused for husband to quarantine as well. AD's made aware as well as social work to see if there are options for notification of community. At this time, no notifications or calls made by this RN to anyone outside of [facility] health. Case management RN Mariann Laster to call this RN back and notify if we are able to do anything.

## 2019-04-03 NOTE — ED Provider Notes (Signed)
[facility] EMERGENCY DEPARTMENT Provider Note   CSN: [id number] Arrival date & time: 04/03/19  1119    History   Chief Complaint Chief Complaint  Patient presents with   Fever    HPI [name] is a 58 y.o. female.     The history is provided by the patient.  Fever  Max temp prior to arrival:  101 Temp source:  Oral Severity:  Mild Onset quality:  Gradual Duration:  4 days Timing:  Intermittent Progression:  Waxing and waning Chronicity:  New Relieved by:  Acetaminophen Worsened by:  Nothing Associated symptoms: no chest pain, no chills, no cough, no dysuria, no ear pain, no myalgias, no nausea, no rash, no sore throat and no vomiting   Risk factors: recent sickness (patient has been getting IV antibiotics at dialysis for hydradenitis although likely compelted now.)   Risk factors comment:  Hx of ESRD, CHF, on 2L of oxygen chronically. Chronic SOB.    Past Medical History:  Diagnosis Date   Chronic diastolic CHF (congestive heart failure) (HCC)    DCM (dilated cardiomyopathy) ([place])    a. presumed nonischemic - no prior cath/nuc. EF 35-40% in 2011, most recently  60-65% by echo 2017.   Depression    DM type 2 (diabetes mellitus, type 2) ([place])    diet controlledwith retinopathy and nephropathy   ESRD on hemodialysis ([place]) 08/21/2017   GERD (gastroesophageal reflux disease)    H/O cardiac arrest 10/2012   Hard of hearing 10/2012   now wears 2 hearing aids   History of hemodialysis dec 2013   HTN (hypertension)    Hydradenitis    Hyperkalemia    Hyperlipidemia    Hypothyroidism    Iron deficiency anemia    Morbid obesity ([place])    Multinodular goiter    Neuropathic pain 08/21/2017   Orthostatic hypotension 06/27/2018   PAF (paroxysmal atrial fibrillation) (HCC)    Paroxysmal atrial flutter ([place])    Pneumonia Nov 10, 2012   Sleep apnea    Intolerant to CPAP   Tinnitus 12/26/2016   Typical atrial flutter ([place])  04/10/2017   Urinary tract infection    taking antibiotics for 3 days prior to surgery    Patient Active Problem List   Diagnosis Date Noted   Pressure injury of skin 03/25/2019   Respiratory distress 03/25/2019   Elevated troponin 03/24/2019   Sepsis ([place]) 01/28/2019   Acute on chronic respiratory failure with hypoxia ([place]) 01/27/2019   Acute encephalopathy 01/27/2019   Anemia 09/17/2018   Orthostatic hypotension 06/27/2018   Neuropathic pain 08/21/2017   Typical atrial flutter ([place]) 04/10/2017   Persistent atrial fibrillation 02/04/2017   Tinnitus 12/26/2016   Streptococcal bacteremia    ESRD (end stage renal disease) on dialysis ([place]) 08/25/2016   Absolute anemia    Pressure ulcer 05/01/2015   Chronic respiratory failure ([place]) 11/10/2013   COPD (chronic obstructive pulmonary disease) ([place]) 11/06/2013   DCM (dilated cardiomyopathy) (HCC)    OSA (obstructive sleep apnea) 07/17/2013   Chronic diastolic heart failure ([place]) 07/16/2013   Dyspnea 07/11/2013   Hypertension 11/30/2012   Severe sepsis with acute organ dysfunction ([place]) 11/10/2012   Hydronephrosis of left kidney 05/28/2012   Nephrolithiasis 05/28/2012   Hidradenitis suppurativa 05/21/2012   Symptomatic anemia 05/21/2012   DM type 2 (diabetes mellitus, type 2) ([place])    Mood disorder in conditions classified elsewhere 09/06/2010   ERYTHEMA NODOSUM, HX OF 07/27/2010   GOITER, MULTINODULAR 05/18/2010   Morbid  obesity ([place]) 05/18/2010   GERD 05/18/2010    Past Surgical History:  Procedure Laterality Date   AV FISTULA PLACEMENT Left 07/25/2013   Procedure: ARTERIOVENOUS (AV) FISTULA CREATION ;  Surgeon: Rosetta Posner, MD;  Location: [facility];  Service: Vascular;  Laterality: Left;   AV FISTULA PLACEMENT Left 10/10/2018   Procedure: INSERTION OF LEFT UPPER ARM brachial to axillary artery ARTERIOVENOUS (AV) GORE-TEX GRAFT using 4-7 stretch graft;  Surgeon: Waynetta Sandy, MD;   Location: [facility];  Service: Vascular;  Laterality: Left;   CYSTOSCOPY W/ RETROGRADES  11/16/2012   Procedure: CYSTOSCOPY WITH RETROGRADE PYELOGRAM;  Surgeon: Reece Packer, MD;  Location: [facility];  Service: Urology;  Laterality: Bilateral;  CYSTOSCOPY,BILATERAL RETROGRADE PYELOGRAM/ REMOVAL LEFT URETERAL STENT/ FULGERATION BLADDER MUCOSA/ INSERTION RIGHT URETERAL STENT   CYSTOSCOPY W/ URETERAL STENT PLACEMENT  05/28/2012   Procedure: CYSTOSCOPY WITH RETROGRADE PYELOGRAM/URETERAL STENT PLACEMENT;  Surgeon: Reece Packer, MD;  [facility];  Service: Urology;  Laterality: Left;   CYSTOSCOPY WITH RETROGRADE PYELOGRAM, URETEROSCOPY AND STENT PLACEMENT Right 06/23/2013   Procedure: CYSTOSCOPY WITH RIGHT URETEROSCOPY, RIGHT  RETROGRADE PYELOGRAM, WITH LASER LIPOTRIPSY AND RIGHT URETERAL STENT EXCHANGE ;  Surgeon: Molli Hazard, MD;  Location: [facility];  Service: Urology;  Laterality: Right;  STENT EXCHANGE     CYSTOSCOPY WITH RETROGRADE PYELOGRAM, URETEROSCOPY AND STENT PLACEMENT Right 11/06/2013   Procedure: CYSTOSCOPY WITH RETROGRADE PYELOGRAM, URETEROSCOPY STONE REMOVAL AND STENT REMOVAL;  Surgeon: Molli Hazard, MD;  Location[facility];  Service: Urology;  Laterality: Right;   HOLMIUM LASER APPLICATION N/A 07/25/5620   Procedure: HOLMIUM LASER APPLICATION;  Surgeon: Molli Hazard, MD;  Location: [facility];  Service: Urology;  Laterality: N/A;   INSERTION OF DIALYSIS CATHETER N/A 09/24/2013   Procedure: INSERTION OF DIALYSIS CATHETER;  Surgeon: Rosetta Posner, MD;  Location: [facility];  Service: Vascular;  Laterality: N/A;   IR AV DIALY SHUNT INTRO NEEDLE/INTRAC INITIAL W/PTA/STENT/IMG LT Left 08/13/2018   IR AV DIALY SHUNT INTRO NEEDLE/INTRACATH INITIAL W/PTA/IMG LEFT  04/19/2018   IR AV DIALY SHUNT INTRO NEEDLE/INTRACATH INITIAL W/PTA/IMG LEFT  08/14/2018   IR FLUORO GUIDE CV LINE RIGHT  09/17/2018   IR FLUORO GUIDE CV LINE RIGHT  09/20/2018   IR US GUIDE VASC ACCESS  LEFT  04/19/2018   IR US GUIDE VASC ACCESS LEFT  08/13/2018   IR US GUIDE VASC ACCESS LEFT  08/14/2018   IR US GUIDE VASC ACCESS RIGHT  09/17/2018   PORT-A-CATH REMOVAL     PORTACATH PLACEMENT     REVISON OF ARTERIOVENOUS FISTULA Left 07/25/2016   Procedure: REVISON OF LEFT ARTERIOVENOUS FISTULA;  Surgeon: Elam Dutch, MD;  Location: [facility];  Service: Vascular;  Laterality: Left;   THROMBECTOMY AND REVISION OF ARTERIOVENTOUS (AV) GORETEX  GRAFT Left 10/10/2018   Procedure: attempted of THROMBECTOMY OF ARTERIOVENTOUS FISTULA LEFT ARM;  Surgeon: Waynetta Sandy, MD;  Location: [facility];  Service: Vascular;  Laterality: Left;     OB History   No obstetric history on file.      Home Medications    Prior to Admission medications   Medication Sig Start Date End Date Taking? Authorizing Provider  acetaminophen (TYLENOL) 500 MG tablet Take 1,000 mg by mouth daily as needed for moderate pain.   Yes [provider]  aspirin EC 81 MG tablet Take 81 mg by mouth at bedtime.    Yes [provider]  B Complex-C-Folic Acid (NEPHRO-VITE PO) Take 1 tablet by  mouth daily.   Yes [provider]  Calcium Acetate 667 MG TABS Take 667 mg by mouth See admin instructions. Take 667 mg by mouth three times a day with meals and 667 mg with each snack   Yes [provider]  Calcium Alginate-Silver (ANTIBACTERIAL ALGINATE W/SILV) 4.25"X4.25" PADS Apply 1 application topically daily.   Yes [provider]  ceFEPIme 2 g in sodium chloride 0.9 % 100 mL Inject 2 g into the vein every Monday, Wednesday, and Friday with hemodialysis. 03/26/19  Yes Black, Lezlie Octave, NP  [place] (SODIUM HYPOCHLORITE) 0.25 % SOLN Apply 1 application topically See admin instructions. Apply to wounds as directed 2 times daily   Yes [provider]  diclofenac sodium (VOLTAREN) 1 % GEL Apply 1 application topically as needed. 03/27/19  Yes [provider]  diltiazem  (CARDIZEM) 30 MG tablet Take 1 tablet every 4 hours AS NEEDED for rapid heart rate lasting more than 30 mins as long as top blood pressure >100. Patient taking differently: Take 30 mg by mouth every 4 (four) hours as needed. Take 30 mg by mouth every four hours as needed for rapid heart rate lasting more than 30 minutes, as long as Systolic number ("top blood pressure") is >100 03/09/17  Yes Sherran Needs, NP  diphenhydrAMINE (BENADRYL) 25 MG tablet Take 25 mg by mouth daily as needed for allergies.   Yes [provider]  DULoxetine (CYMBALTA) 60 MG capsule Take 1 capsule (60 mg total) by mouth daily. 12/03/12  Yes Short, Noah Delaine, MD  famotidine (PEPCID) 20 MG tablet TAKE 1 TABLET BY MOUTH AT BEDTIME Patient taking differently: Take 20 mg by mouth at bedtime.  10/15/14  Yes Tanda Rockers, MD  feeding supplement, RESOURCE BREEZE, (RESOURCE BREEZE) LIQD Take 1 Container by mouth daily as needed (for meal supplementation).    Yes [provider]  glipiZIDE (GLUCOTROL) 5 MG tablet Take 0.5 tablets (2.5 mg total) by mouth daily. 08/31/16  Yes Debbe Odea, MD  levothyroxine (SYNTHROID, LEVOTHROID) 75 MCG tablet Take 75 mcg by mouth daily before breakfast.  08/17/15  Yes [provider]  metroNIDAZOLE (FLAGYL) 500 MG tablet Take 1 tablet (500 mg total) by mouth 3 (three) times daily for 14 days. 03/25/19 04/08/19 Yes Vann, Jessica U, DO  midodrine (PROAMATINE) 10 MG tablet Take 1 tablet (10 mg total) by mouth 3 (three) times daily. Patient taking differently: Take 10 mg by mouth See admin instructions. Take 10 mg by mouth in the morning on Mon/Wed/Fri ONLY twice a day- before dialysis 08/31/16  Yes Rizwan, Eunice Blase, MD  Multiple Vitamin (MULTIVITAMIN WITH MINERALS) TABS tablet Take 1 tablet by mouth daily.   Yes [provider]  oxyCODONE-acetaminophen (PERCOCET/ROXICET) 5-325 MG tablet Take 1-2 tablets by mouth every 6 (six) hours as needed. Patient has not taken since  started dialysis 08/2013 due to causes hypotension Patient taking differently: Take 2 tablets by mouth every 6 (six) hours as needed for moderate pain. Take in the afternoon after dialysis on dialysis days 07/25/16  Yes Trinh, Kimberly A, PA-C  pravastatin (PRAVACHOL) 40 MG tablet Take 40 mg by mouth every evening.  12/03/12  Yes Short, Noah Delaine, MD  Probiotic Product (PROBIOTIC DAILY PO) Take 1 capsule by mouth 2 (two) times daily.    Yes [provider]  sevelamer carbonate (RENVELA) 800 MG tablet Take 2,400 mg by mouth 3 (three) times daily with meals.   Yes [provider]  sucroferric oxyhydroxide (VELPHORO) 500 MG  chewable tablet Chew 500-1,000 mg by mouth See admin instructions. Chew 1,000 mg by mouth with each meal three times a day and 500 mg with each snack   Yes [provider]    Family History Family History  Problem Relation Age of Onset   Coronary artery disease Mother    Hypertension Mother    Diabetes type II Mother    Coronary artery disease Father    Hypertension Father    Malignant hyperthermia Father    Cancer Maternal Grandfather        ? Type   Kidney failure Maternal Grandmother     Social History Social History   Tobacco Use   Smoking status: Former Smoker    Packs/day: 0.25    Years: 1.00    Pack years: 0.25    Types: Cigarettes    Last attempt to quit: 11/28/1979    Years since quitting: 39.3   Smokeless tobacco: Never Used  Substance Use Topics   Alcohol use: No   Drug use: No     Allergies   Rosiglitazone; Amoxicillin; and Amoxicillin-pot clavulanate   Review of Systems Review of Systems  Constitutional: Positive for fever. Negative for chills.  HENT: Negative for ear pain and sore throat.   Eyes: Negative for pain and visual disturbance.  Respiratory: Negative for cough and shortness of breath.   Cardiovascular: Negative for chest pain and palpitations.  Gastrointestinal: Negative for abdominal pain,  nausea and vomiting.  Genitourinary: Negative for dysuria and hematuria.  Musculoskeletal: Negative for arthralgias, back pain and myalgias.  Skin: Positive for wound (multile wounds on buttocks). Negative for color change and rash.  Neurological: Negative for seizures and syncope.  All other systems reviewed and are negative.    Physical Exam Updated Vital Signs  ED Triage Vitals  Enc Vitals Group     BP 04/03/19 1145 123/63     Pulse Rate 04/03/19 1145 (!) 102     Resp 04/03/19 1145 15     Temp 04/03/19 1142 99.7 F (37.6 C)     Temp Source 04/03/19 1142 Oral     SpO2 04/03/19 1140 96 %     Weight 04/03/19 1144 (!) 370 lb (167.8 kg)     Height 04/03/19 1144 5' 6.5" (1.689 m)     Head Circumference --      Peak Flow --      Pain Score 04/03/19 1142 9     Pain Loc --      Pain Edu? --      Excl. in [place]? --     Physical Exam Vitals signs and nursing note reviewed.  Constitutional:      General: She is not in acute distress.    Appearance: She is well-developed.  HENT:     Head: Normocephalic and atraumatic.     Nose: Nose normal.     Mouth/Throat:     Mouth: Mucous membranes are moist.  Eyes:     Extraocular Movements: Extraocular movements intact.     Conjunctiva/sclera: Conjunctivae normal.     Pupils: Pupils are equal, round, and reactive to light.  Neck:     Musculoskeletal: Normal range of motion and neck supple.  Cardiovascular:     Rate and Rhythm: Normal rate and regular rhythm.     Pulses: Normal pulses.     Heart sounds: Normal heart sounds. No murmur.  Pulmonary:     Effort: Pulmonary effort is normal. No respiratory distress.  Breath sounds: No stridor. No rhonchi.     Comments: Coarse breath sounds throughout Abdominal:     Palpations: Abdomen is soft.     Tenderness: There is no abdominal tenderness.  Musculoskeletal: Normal range of motion.  Skin:    General: Skin is warm and dry.  Neurological:     General: No focal deficit present.      Mental Status: She is alert.  Psychiatric:        Mood and Affect: Mood normal.      ED Treatments / Results  Labs (all labs ordered are listed, but only abnormal results are displayed) Labs Reviewed  SARS CORONAVIRUS 2 ([facility]) - Abnormal; Notable for the following components:      Result Value   SARS Coronavirus 2 POSITIVE (*)    All other components within normal limits  CBC WITH DIFFERENTIAL/PLATELET - Abnormal; Notable for the following components:   RBC 2.83 (*)    Hemoglobin 8.7 (*)    HCT 31.3 (*)    MCV 110.6 (*)    MCHC 27.8 (*)    RDW 16.1 (*)    Abs Immature Granulocytes 0.17 (*)    All other components within normal limits  COMPREHENSIVE METABOLIC PANEL - Abnormal; Notable for the following components:   CO2 21 (*)    Glucose, Bld 155 (*)    BUN 75 (*)    Creatinine, Ser 12.35 (*)    Calcium 8.5 (*)    Albumin 2.3 (*)    GFR calc non Af Amer 3 (*)    GFR calc Af Amer 3 (*)    Anion gap 16 (*)    All other components within normal limits  LIPASE, BLOOD - Abnormal; Notable for the following components:   Lipase 82 (*)    All other components within normal limits  CULTURE, BLOOD (ROUTINE X 2)  CULTURE, BLOOD (ROUTINE X 2)  LACTIC ACID, PLASMA    EKG None  Radiology Dg Chest Port 1 View  Result Date: 04/03/2019 CLINICAL DATA:  Fever EXAM: PORTABLE CHEST 1 VIEW COMPARISON:  03/24/2019 FINDINGS: A right-sided dialysis catheter is noted with the tip projecting over the distal SVC. Diffuse hazy airspace opacities are noted bilaterally, greatest within the left lung field. The lung volumes are low. The cardiac silhouette is significantly enlarged. There is no definite pneumothorax. IMPRESSION: 1. Low lung volumes with diffuse hazy bilateral airspace opacities, left worse than right, which can be seen in patients with pulmonary edema or an atypical infectious process. 2. Cardiomegaly. 3. Dialysis catheter tip projects  over the distal SVC. Electronically Signed   By: Constance Holster M.D.   On: 04/03/2019 14:09    Procedures Procedures (including critical care time)  Medications Ordered in ED Medications  fentaNYL (SUBLIMAZE) injection 50 mcg (50 mcg Intravenous Refused 04/03/19 1220)     Initial Impression / Assessment and Plan / ED Course  I have reviewed the triage vital signs and the nursing notes.  Pertinent labs & imaging results that were available during my care of the patient were reviewed by me and considered in my medical decision making (see chart for details).     [name] is a 58 year old female with history of heart failure, on 2 L of oxygen chronically, hidradenitis, high cholesterol, end-stage renal disease on hemodialysis who presents to the ED with fever.  Patient has been receiving IV antibiotics at dialysis for hidradenitis wounds.  She believes that  antibiotics have now ended.  She does not have any severe pain in her buttocks where her hidradenitis is.  Overall she does not appear to have any purulent drainage from these multiple sites.  There is no obvious fluctuance.  No concern for abscess.  Patient has refused CT scans in the past to evaluate for further infection and she continues to refuse upon my evaluation.  Overall, however I think there is a low suspicion that there is a acute infectious process going on at this area.  There is no purulent drainage.  Patient follows with infectious disease for this.  Patient has had fever for 4 days but denies any new cough or shortness of breath.  She is on chronically 2 L of oxygen.  She appears comfortable on exam.  She does not have any abdominal tenderness.  She has no fever upon arrival here.  Otherwise normal vitals.  She refuses rectal temperature.  She says she had a fever of 101 at home.  She said that her doctor told her to come here for evaluation due to the fever.  Sepsis work-up was initiated with coronavirus testing, chest  x-ray.  As previously stated patient did not want to have a CT scan of her abdomen and pelvis to evaluate for any complications with her hidradenitis.  She refused.  She has capacity to make this decision.  Overall believe there is not much from a new infectious standpoint in this area anyways.  Patient does not make urine.  Patient had no significant leukocytosis, lactic acid.  Electrolytes were overall at baseline.  No significant anemia.  Patient had positive coronavirus testing.  Chest x-ray showed overall low lung volumes with diffuse hazy bilateral airspace opacities.  This could be secondary to coronavirus or from pulmonary edema which patient chronically has.  Patient is adamant that she does not feel short of breath or has had a cough.  Given the patient's multiple comorbidities and the need to have dialysis recommended that she be admitted to the hospital but she wanted to leave [facility].  Therefore, I called nephrology who states that patient can have dialysis Monday Wednesday and Friday at [facility] only clinic for the next 2 weeks.  Social work will call the patient tomorrow and help arrange for transportation to these visits.  Patient is happy with that plan.  She was given very strict return precautions.  Overall patient was breathing comfortably.  She appeared to be stable on her home oxygen requirement.  Discussed with her about needs to come back to the ED and about social isolation at home.  She was given information about coronavirus.  And she understands instructions.  Believe that patient would have benefited from further evaluation and stay to make sure she improves but she has capacity to leave.  She understands and that she will come back if she gets worse. She always will be seen in a health setting tomorrow at dialysis as well.   This chart was dictated using voice recognition software.  Despite best efforts to proofread,  errors can occur which can change the documentation  meaning.    Final Clinical Impressions(s) / ED Diagnoses   Final diagnoses:  Fever  COVID-19 virus infection    ED Discharge Orders    None       Lennice Sites, DO 04/03/19 1614

## 2019-04-03 NOTE — Progress Notes (Signed)
PIV consult: IV started by ED RN. Cancel consult.

## 2019-04-03 NOTE — Discharge Instructions (Addendum)
You will be called tomorrow for dialysis.  You will go to [facility] for in dialysis Monday Wednesday and Friday at around 5 PM for dialysis for the next 2 weeks due to being positive for coronavirus. PLEASE RETURN TO THE ED IF YOU HAVE WORSENING SYMPTOMS. FOLLOW UP WITH DR. [employer].    If you live with, or provide care at home for, a person confirmed to have, or being evaluated for, COVID-19 infection please follow these guidelines to prevent infection:  Follow healthcare providers instructions Make sure that you understand and can help the patient follow any healthcare provider instructions for all care.  Provide for the patients basic needs You should help the patient with basic needs in the home and provide support for getting groceries, prescriptions, and other personal needs.  Monitor the patients symptoms If they are getting sicker, call his or her medical provider a  This will help the healthcare providers office take steps to keep other people from getting infected. Ask the healthcare provider to call the local or state health department.  Limit the number of people who have contact with the patient If possible, have only one caregiver for the patient. Other household members should stay in another home or place of residence. If this is not possible, they should stay in another room, or be separated from the patient as much as possible. Use a separate bathroom, if available. Restrict visitors who do not have an essential need to be in the home.  Keep older adults, very young children, and other sick people away from the patient Keep older adults, very young children, and those who have compromised immune systems or chronic health conditions away from the patient. This includes people with chronic heart, lung, or kidney conditions, diabetes, and cancer.  Ensure good ventilation Make sure that shared spaces in the home have good air flow, such as from an air  conditioner or an opened window, weather permitting.  Wash your hands often Wash your hands often and thoroughly with soap and water for at least 20 seconds. You can use an alcohol based hand sanitizer if soap and water are not available and if your hands are not visibly dirty. Avoid touching your eyes, nose, and mouth with unwashed hands. Use disposable paper towels to dry your hands. If not available, use dedicated cloth towels and replace them when they become wet.  Wear a facemask and gloves Wear a disposable facemask at all times in the room and gloves when you touch or have contact with the patients blood, body fluids, and/or secretions or excretions, such as sweat, saliva, sputum, nasal mucus, vomit, urine, or feces.  Ensure the mask fits over your nose and mouth tightly, and do not touch it during use. Throw out disposable facemasks and gloves after using them. Do not reuse. Wash your hands immediately after removing your facemask and gloves. If your personal clothing becomes contaminated, carefully remove clothing and launder. Wash your hands after handling contaminated clothing. Place all used disposable facemasks, gloves, and other waste in a lined container before disposing them with other household waste. Remove gloves and wash your hands immediately after handling these items.  Do not share dishes, glasses, or other household items with the patient Avoid sharing household items. You should not share dishes, drinking glasses, cups, eating utensils, towels, bedding, or other items After the person uses these items, you should wash them thoroughly with soap and water.  Wash laundry thoroughly Immediately remove and wash clothes  or bedding that have blood, body fluids, and/or secretions or excretions, such as sweat, saliva, sputum, nasal mucus, vomit, urine, or feces, on them. Wear gloves when handling laundry from the patient. Read and follow directions on labels of laundry or  clothing items and detergent. In general, wash and dry with the warmest temperatures recommended on the label.  Clean all areas the individual has used often Clean all touchable surfaces, such as counters, tabletops, doorknobs, bathroom fixtures, toilets, phones, keyboards, tablets, and bedside tables, every day. Also, clean any surfaces that may have blood, body fluids, and/or secretions or excretions on them. Wear gloves when cleaning surfaces the patient has come in contact with. Use a diluted bleach solution (e.g., dilute bleach with 1 part bleach and 10 parts water) or a household disinfectant with a label that says EPA-registered for coronaviruses. To make a bleach solution at home, add 1 tablespoon of bleach to 1 quart (4 cups) of water. For a larger supply, add  cup of bleach to 1 gallon (16 cups) of water. Read labels of cleaning products and follow recommendations provided on product labels. Labels contain instructions for safe and effective use of the cleaning product including precautions you should take when applying the product, such as wearing gloves or eye protection and making sure you have good ventilation during use of the product. Remove gloves and wash hands immediately after cleaning.  Monitor yourself for signs and symptoms of illness Caregivers and household members are considered close contacts, should monitor their health, and will be asked to limit movement outside of the home to the extent possible. Follow the monitoring steps for close contacts listed on the symptom monitoring form.   ? If you have additional questions, contact your local health department or call the epidemiologist on call at [phone] (available 24/7). ? This guidance is subject to change. For the most up-to-date guidance from [facility], please refer to their website: YouBlogs.pl

## 2019-04-04 DIAGNOSIS — N186 End stage renal disease: Secondary | ICD-10-CM | POA: Diagnosis not present

## 2019-04-04 DIAGNOSIS — N2581 Secondary hyperparathyroidism of renal origin: Secondary | ICD-10-CM | POA: Diagnosis not present

## 2019-04-04 DIAGNOSIS — D631 Anemia in chronic kidney disease: Secondary | ICD-10-CM | POA: Diagnosis not present

## 2019-04-04 DIAGNOSIS — U071 COVID-19: Secondary | ICD-10-CM | POA: Diagnosis not present

## 2019-04-04 DIAGNOSIS — L732 Hidradenitis suppurativa: Secondary | ICD-10-CM | POA: Diagnosis not present

## 2019-04-04 DIAGNOSIS — E1129 Type 2 diabetes mellitus with other diabetic kidney complication: Secondary | ICD-10-CM | POA: Diagnosis not present

## 2019-04-06 ENCOUNTER — Inpatient Hospital Stay (HOSPITAL_COMMUNITY): Payer: Medicare Other

## 2019-04-06 ENCOUNTER — Other Ambulatory Visit: Payer: Self-pay

## 2019-04-06 ENCOUNTER — Encounter (HOSPITAL_COMMUNITY): Payer: Self-pay | Admitting: *Deleted

## 2019-04-06 ENCOUNTER — Encounter (HOSPITAL_COMMUNITY): Payer: Self-pay | Admitting: Emergency Medicine

## 2019-04-06 ENCOUNTER — Inpatient Hospital Stay: Payer: Self-pay

## 2019-04-06 ENCOUNTER — Inpatient Hospital Stay (HOSPITAL_COMMUNITY)
Admission: EM | Admit: 2019-04-06 | Discharge: 2019-04-28 | DRG: 871 | Disposition: E | Payer: Medicare Other | Attending: Internal Medicine | Admitting: Internal Medicine

## 2019-04-06 ENCOUNTER — Emergency Department (HOSPITAL_COMMUNITY): Payer: Medicare Other

## 2019-04-06 DIAGNOSIS — R404 Transient alteration of awareness: Secondary | ICD-10-CM | POA: Diagnosis not present

## 2019-04-06 DIAGNOSIS — I480 Paroxysmal atrial fibrillation: Secondary | ICD-10-CM | POA: Diagnosis present

## 2019-04-06 DIAGNOSIS — E039 Hypothyroidism, unspecified: Secondary | ICD-10-CM | POA: Diagnosis present

## 2019-04-06 DIAGNOSIS — E785 Hyperlipidemia, unspecified: Secondary | ICD-10-CM | POA: Diagnosis present

## 2019-04-06 DIAGNOSIS — J440 Chronic obstructive pulmonary disease with acute lower respiratory infection: Secondary | ICD-10-CM | POA: Diagnosis present

## 2019-04-06 DIAGNOSIS — U071 COVID-19: Secondary | ICD-10-CM

## 2019-04-06 DIAGNOSIS — E11319 Type 2 diabetes mellitus with unspecified diabetic retinopathy without macular edema: Secondary | ICD-10-CM | POA: Diagnosis present

## 2019-04-06 DIAGNOSIS — Z7982 Long term (current) use of aspirin: Secondary | ICD-10-CM | POA: Diagnosis not present

## 2019-04-06 DIAGNOSIS — Z79890 Hormone replacement therapy (postmenopausal): Secondary | ICD-10-CM

## 2019-04-06 DIAGNOSIS — E1121 Type 2 diabetes mellitus with diabetic nephropathy: Secondary | ICD-10-CM | POA: Diagnosis present

## 2019-04-06 DIAGNOSIS — J1289 Other viral pneumonia: Secondary | ICD-10-CM | POA: Diagnosis present

## 2019-04-06 DIAGNOSIS — J80 Acute respiratory distress syndrome: Secondary | ICD-10-CM | POA: Diagnosis present

## 2019-04-06 DIAGNOSIS — Z7984 Long term (current) use of oral hypoglycemic drugs: Secondary | ICD-10-CM

## 2019-04-06 DIAGNOSIS — I132 Hypertensive heart and chronic kidney disease with heart failure and with stage 5 chronic kidney disease, or end stage renal disease: Secondary | ICD-10-CM | POA: Diagnosis present

## 2019-04-06 DIAGNOSIS — J9601 Acute respiratory failure with hypoxia: Secondary | ICD-10-CM

## 2019-04-06 DIAGNOSIS — G4733 Obstructive sleep apnea (adult) (pediatric): Secondary | ICD-10-CM | POA: Diagnosis present

## 2019-04-06 DIAGNOSIS — Z6841 Body Mass Index (BMI) 40.0 and over, adult: Secondary | ICD-10-CM

## 2019-04-06 DIAGNOSIS — N186 End stage renal disease: Secondary | ICD-10-CM | POA: Diagnosis not present

## 2019-04-06 DIAGNOSIS — R6521 Severe sepsis with septic shock: Secondary | ICD-10-CM | POA: Diagnosis present

## 2019-04-06 DIAGNOSIS — J1282 Pneumonia due to coronavirus disease 2019: Secondary | ICD-10-CM

## 2019-04-06 DIAGNOSIS — Z87891 Personal history of nicotine dependence: Secondary | ICD-10-CM | POA: Diagnosis not present

## 2019-04-06 DIAGNOSIS — E1165 Type 2 diabetes mellitus with hyperglycemia: Secondary | ICD-10-CM | POA: Diagnosis not present

## 2019-04-06 DIAGNOSIS — K219 Gastro-esophageal reflux disease without esophagitis: Secondary | ICD-10-CM | POA: Diagnosis present

## 2019-04-06 DIAGNOSIS — A4189 Other specified sepsis: Secondary | ICD-10-CM | POA: Diagnosis present

## 2019-04-06 DIAGNOSIS — Z79899 Other long term (current) drug therapy: Secondary | ICD-10-CM | POA: Diagnosis not present

## 2019-04-06 DIAGNOSIS — Z992 Dependence on renal dialysis: Secondary | ICD-10-CM

## 2019-04-06 DIAGNOSIS — I120 Hypertensive chronic kidney disease with stage 5 chronic kidney disease or end stage renal disease: Secondary | ICD-10-CM | POA: Diagnosis not present

## 2019-04-06 DIAGNOSIS — I5032 Chronic diastolic (congestive) heart failure: Secondary | ICD-10-CM | POA: Diagnosis present

## 2019-04-06 DIAGNOSIS — R918 Other nonspecific abnormal finding of lung field: Secondary | ICD-10-CM | POA: Diagnosis not present

## 2019-04-06 DIAGNOSIS — E1122 Type 2 diabetes mellitus with diabetic chronic kidney disease: Secondary | ICD-10-CM | POA: Diagnosis present

## 2019-04-06 DIAGNOSIS — I420 Dilated cardiomyopathy: Secondary | ICD-10-CM | POA: Diagnosis present

## 2019-04-06 DIAGNOSIS — E872 Acidosis: Secondary | ICD-10-CM | POA: Diagnosis present

## 2019-04-06 DIAGNOSIS — J189 Pneumonia, unspecified organism: Secondary | ICD-10-CM | POA: Diagnosis not present

## 2019-04-06 DIAGNOSIS — E875 Hyperkalemia: Secondary | ICD-10-CM | POA: Diagnosis present

## 2019-04-06 DIAGNOSIS — N250 Renal osteodystrophy: Secondary | ICD-10-CM | POA: Diagnosis not present

## 2019-04-06 DIAGNOSIS — Z978 Presence of other specified devices: Secondary | ICD-10-CM

## 2019-04-06 DIAGNOSIS — R0602 Shortness of breath: Secondary | ICD-10-CM | POA: Diagnosis not present

## 2019-04-06 DIAGNOSIS — D631 Anemia in chronic kidney disease: Secondary | ICD-10-CM | POA: Diagnosis not present

## 2019-04-06 DIAGNOSIS — R0600 Dyspnea, unspecified: Secondary | ICD-10-CM | POA: Diagnosis not present

## 2019-04-06 DIAGNOSIS — D638 Anemia in other chronic diseases classified elsewhere: Secondary | ICD-10-CM | POA: Diagnosis present

## 2019-04-06 DIAGNOSIS — Z66 Do not resuscitate: Secondary | ICD-10-CM | POA: Diagnosis present

## 2019-04-06 LAB — COMPREHENSIVE METABOLIC PANEL
ALT: 12 U/L (ref 0–44)
AST: 36 U/L (ref 15–41)
Albumin: 2.5 g/dL — ABNORMAL LOW (ref 3.5–5.0)
Alkaline Phosphatase: 79 U/L (ref 38–126)
Anion gap: 20 — ABNORMAL HIGH (ref 5–15)
BUN: 56 mg/dL — ABNORMAL HIGH (ref 6–20)
CO2: 20 mmol/L — ABNORMAL LOW (ref 22–32)
Calcium: 8.7 mg/dL — ABNORMAL LOW (ref 8.9–10.3)
Chloride: 93 mmol/L — ABNORMAL LOW (ref 98–111)
Creatinine, Ser: 10.68 mg/dL — ABNORMAL HIGH (ref 0.44–1.00)
GFR calc Af Amer: 4 mL/min — ABNORMAL LOW (ref >60)
GFR calc non Af Amer: 4 mL/min — ABNORMAL LOW (ref >60)
Glucose, Bld: 236 mg/dL — ABNORMAL HIGH (ref 70–99)
Potassium: 5.2 mmol/L — ABNORMAL HIGH (ref 3.5–5.1)
Sodium: 133 mmol/L — ABNORMAL LOW (ref 135–145)
Total Bilirubin: 0.8 mg/dL (ref 0.3–1.2)
Total Protein: 9.4 g/dL — ABNORMAL HIGH (ref 6.5–8.1)

## 2019-04-06 LAB — POCT I-STAT EG7
Acid-base deficit: 5 mmol/L — ABNORMAL HIGH (ref 0.0–2.0)
Bicarbonate: 21.4 mmol/L (ref 20.0–28.0)
Calcium, Ion: 1.02 mmol/L — ABNORMAL LOW (ref 1.15–1.40)
HCT: 34 % — ABNORMAL LOW (ref 36.0–46.0)
Hemoglobin: 11.6 g/dL — ABNORMAL LOW (ref 12.0–15.0)
O2 Saturation: 44 %
Patient temperature: 37
Potassium: 5.2 mmol/L — ABNORMAL HIGH (ref 3.5–5.1)
Sodium: 133 mmol/L — ABNORMAL LOW (ref 135–145)
TCO2: 23 mmol/L (ref 22–32)
pCO2, Ven: 44.7 mmHg (ref 44.0–60.0)
pH, Ven: 7.287 (ref 7.250–7.430)
pO2, Ven: 27 mmHg — CL (ref 32.0–45.0)

## 2019-04-06 LAB — VANCOMYCIN, RANDOM: Vancomycin Rm: 10

## 2019-04-06 LAB — CBC WITH DIFFERENTIAL/PLATELET
Abs Immature Granulocytes: 0.16 10*3/uL — ABNORMAL HIGH (ref 0.00–0.07)
Basophils Absolute: 0 10*3/uL (ref 0.0–0.1)
Basophils Relative: 0 %
Eosinophils Absolute: 0 10*3/uL (ref 0.0–0.5)
Eosinophils Relative: 0 %
HCT: 33.1 % — ABNORMAL LOW (ref 36.0–46.0)
Hemoglobin: 9.3 g/dL — ABNORMAL LOW (ref 12.0–15.0)
Immature Granulocytes: 1 %
Lymphocytes Relative: 3 %
Lymphs Abs: 0.5 10*3/uL — ABNORMAL LOW (ref 0.7–4.0)
MCH: 30.8 pg (ref 26.0–34.0)
MCHC: 28.1 g/dL — ABNORMAL LOW (ref 30.0–36.0)
MCV: 109.6 fL — ABNORMAL HIGH (ref 80.0–100.0)
Monocytes Absolute: 0.6 10*3/uL (ref 0.1–1.0)
Monocytes Relative: 3 %
Neutro Abs: 16.1 10*3/uL — ABNORMAL HIGH (ref 1.7–7.7)
Neutrophils Relative %: 93 %
Platelets: 294 10*3/uL (ref 150–400)
RBC: 3.02 MIL/uL — ABNORMAL LOW (ref 3.87–5.11)
RDW: 16.4 % — ABNORMAL HIGH (ref 11.5–15.5)
WBC: 17.3 10*3/uL — ABNORMAL HIGH (ref 4.0–10.5)
nRBC: 0 % (ref 0.0–0.2)

## 2019-04-06 LAB — POCT I-STAT 7, (LYTES, BLD GAS, ICA,H+H)
Acid-base deficit: 4 mmol/L — ABNORMAL HIGH (ref 0.0–2.0)
Acid-base deficit: 4 mmol/L — ABNORMAL HIGH (ref 0.0–2.0)
Acid-base deficit: 2 mmol/L (ref 0.0–2.0)
Bicarbonate: 24.8 mmol/L (ref 20.0–28.0)
Bicarbonate: 24.4 mmol/L (ref 20.0–28.0)
Bicarbonate: 25.8 mmol/L (ref 20.0–28.0)
Calcium, Ion: 1.13 mmol/L — ABNORMAL LOW (ref 1.15–1.40)
Calcium, Ion: 1.1 mmol/L — ABNORMAL LOW (ref 1.15–1.40)
Calcium, Ion: 1.12 mmol/L — ABNORMAL LOW (ref 1.15–1.40)
HCT: 32 % — ABNORMAL LOW (ref 36.0–46.0)
HCT: 31 % — ABNORMAL LOW (ref 36.0–46.0)
HCT: 32 % — ABNORMAL LOW (ref 36.0–46.0)
Hemoglobin: 10.9 g/dL — ABNORMAL LOW (ref 12.0–15.0)
Hemoglobin: 10.5 g/dL — ABNORMAL LOW (ref 12.0–15.0)
Hemoglobin: 10.9 g/dL — ABNORMAL LOW (ref 12.0–15.0)
O2 Saturation: 77 %
O2 Saturation: 77 %
O2 Saturation: 74 %
Patient temperature: 98.6
Patient temperature: 102.9
Patient temperature: 102.5
Potassium: 5.6 mmol/L — ABNORMAL HIGH (ref 3.5–5.1)
Potassium: 5.7 mmol/L — ABNORMAL HIGH (ref 3.5–5.1)
Potassium: 5.7 mmol/L — ABNORMAL HIGH (ref 3.5–5.1)
Sodium: 133 mmol/L — ABNORMAL LOW (ref 135–145)
Sodium: 133 mmol/L — ABNORMAL LOW (ref 135–145)
Sodium: 133 mmol/L — ABNORMAL LOW (ref 135–145)
TCO2: 27 mmol/L (ref 22–32)
TCO2: 26 mmol/L (ref 22–32)
TCO2: 28 mmol/L (ref 22–32)
pCO2 arterial: 65.9 mmHg (ref 32.0–48.0)
pCO2 arterial: 65.4 mmHg (ref 32.0–48.0)
pCO2 arterial: 67 mmHg (ref 32.0–48.0)
pH, Arterial: 7.184 — CL (ref 7.350–7.450)
pH, Arterial: 7.193 — CL (ref 7.350–7.450)
pH, Arterial: 7.205 — ABNORMAL LOW (ref 7.350–7.450)
pO2, Arterial: 53 mmHg — ABNORMAL LOW (ref 83.0–108.0)
pO2, Arterial: 60 mmHg — ABNORMAL LOW (ref 83.0–108.0)
pO2, Arterial: 55 mmHg — ABNORMAL LOW (ref 83.0–108.0)

## 2019-04-06 LAB — BRAIN NATRIURETIC PEPTIDE: B Natriuretic Peptide: 640.8 pg/mL — ABNORMAL HIGH (ref 0.0–100.0)

## 2019-04-06 LAB — MRSA PCR SCREENING: MRSA by PCR: NEGATIVE

## 2019-04-06 LAB — BASIC METABOLIC PANEL
Anion gap: 19 — ABNORMAL HIGH (ref 5–15)
BUN: 57 mg/dL — ABNORMAL HIGH (ref 6–20)
CO2: 23 mmol/L (ref 22–32)
Calcium: 8.4 mg/dL — ABNORMAL LOW (ref 8.9–10.3)
Chloride: 91 mmol/L — ABNORMAL LOW (ref 98–111)
Creatinine, Ser: 11.03 mg/dL — ABNORMAL HIGH (ref 0.44–1.00)
GFR calc Af Amer: 4 mL/min — ABNORMAL LOW (ref >60)
GFR calc non Af Amer: 3 mL/min — ABNORMAL LOW (ref >60)
Glucose, Bld: 252 mg/dL — ABNORMAL HIGH (ref 70–99)
Potassium: 5.7 mmol/L — ABNORMAL HIGH (ref 3.5–5.1)
Sodium: 133 mmol/L — ABNORMAL LOW (ref 135–145)

## 2019-04-06 LAB — ECHOCARDIOGRAM LIMITED
Height: 66.5 in
Weight: 5925.96 oz

## 2019-04-06 LAB — GLUCOSE, CAPILLARY
Glucose-Capillary: 225 mg/dL — ABNORMAL HIGH (ref 70–99)
Glucose-Capillary: 214 mg/dL — ABNORMAL HIGH (ref 70–99)
Glucose-Capillary: 180 mg/dL — ABNORMAL HIGH (ref 70–99)

## 2019-04-06 LAB — PROCALCITONIN: Procalcitonin: 17.94 ng/mL

## 2019-04-06 LAB — FERRITIN: Ferritin: >7500 ng/mL — ABNORMAL HIGH (ref 11–307)

## 2019-04-06 LAB — PHOSPHORUS: Phosphorus: 7.7 mg/dL — ABNORMAL HIGH (ref 2.5–4.6)

## 2019-04-06 LAB — FIBRINOGEN: Fibrinogen: >800 mg/dL — ABNORMAL HIGH (ref 210–475)

## 2019-04-06 LAB — MAGNESIUM: Magnesium: 2.5 mg/dL — ABNORMAL HIGH (ref 1.7–2.4)

## 2019-04-06 LAB — LACTIC ACID, PLASMA
Lactic Acid, Venous: 2.9 mmol/L (ref 0.5–1.9)
Lactic Acid, Venous: 1.3 mmol/L (ref 0.5–1.9)

## 2019-04-06 LAB — D-DIMER, QUANTITATIVE: D-Dimer, Quant: 2.54 ug/mL-FEU — ABNORMAL HIGH (ref 0.00–0.50)

## 2019-04-06 LAB — C-REACTIVE PROTEIN: CRP: 23.4 mg/dL — ABNORMAL HIGH (ref <1.0)

## 2019-04-06 LAB — TRIGLYCERIDES: Triglycerides: 157 mg/dL — ABNORMAL HIGH (ref <150)

## 2019-04-06 LAB — LACTATE DEHYDROGENASE: LDH: 304 U/L — ABNORMAL HIGH (ref 98–192)

## 2019-04-06 MED ORDER — STERILE WATER FOR INJECTION IV SOLN
INTRAVENOUS | Status: DC
  Administered 2019-04-06: 10:00:00 via INTRAVENOUS
  Filled 2019-04-06 (×2): qty 850

## 2019-04-06 MED ORDER — HYDROMORPHONE BOLUS VIA INFUSION
0.5000 mg | INTRAVENOUS | Status: DC | PRN
  Filled 2019-04-06: qty 1

## 2019-04-06 MED ORDER — PERFLUTREN LIPID MICROSPHERE
4.0000 mL | Freq: Once | INTRAVENOUS | Status: AC
  Administered 2019-04-06: 4 mL via INTRAVENOUS
  Filled 2019-04-06: qty 4

## 2019-04-06 MED ORDER — SODIUM CHLORIDE 0.9 % FOR CRRT
INTRAVENOUS_CENTRAL | Status: DC | PRN
  Filled 2019-04-06: qty 1000

## 2019-04-06 MED ORDER — EPINEPHRINE 1 MG/10ML IJ SOSY
PREFILLED_SYRINGE | INTRAMUSCULAR | Status: AC
  Administered 2019-04-06: 15:00:00 1 mg
  Filled 2019-04-06: qty 10

## 2019-04-06 MED ORDER — SODIUM CHLORIDE 0.9% IV SOLUTION: Freq: Once | INTRAVENOUS | Status: DC

## 2019-04-06 MED ORDER — MIDAZOLAM HCL 2 MG/2ML IJ SOLN
INTRAMUSCULAR | Status: AC
  Filled 2019-04-06: qty 2

## 2019-04-06 MED ORDER — PRISMASOL BGK 4/2.5 32-4-2.5 MEQ/L REPLACEMENT SOLN
Status: DC
  Filled 2019-04-06 (×2): qty 5000

## 2019-04-06 MED ORDER — PROPOFOL 1000 MG/100ML IV EMUL
INTRAVENOUS | Status: AC
  Filled 2019-04-06: qty 100

## 2019-04-06 MED ORDER — LEVOTHYROXINE SODIUM 75 MCG PO TABS
75.0000 ug | ORAL_TABLET | Freq: Every day | ORAL | Status: DC
  Administered 2019-04-06: 75 ug
  Filled 2019-04-06 (×2): qty 1

## 2019-04-06 MED ORDER — VASOPRESSIN 20 UNIT/ML IV SOLN
0.0300 [IU]/min | INTRAVENOUS | Status: DC
  Filled 2019-04-06: qty 2

## 2019-04-06 MED ORDER — ACETAMINOPHEN 325 MG PO TABS
650.0000 mg | ORAL_TABLET | ORAL | Status: DC | PRN
  Administered 2019-04-06: 08:00:00 650 mg via ORAL
  Filled 2019-04-06: qty 2

## 2019-04-06 MED ORDER — SODIUM BICARBONATE 8.4 % IV SOLN
INTRAVENOUS | Status: AC
  Administered 2019-04-06: 100 meq
  Filled 2019-04-06: qty 100

## 2019-04-06 MED ORDER — PRISMASOL BGK 4/2.5 32-4-2.5 MEQ/L IV SOLN
INTRAVENOUS | Status: DC
  Filled 2019-04-06 (×10): qty 5000

## 2019-04-06 MED ORDER — SODIUM CHLORIDE 0.9 % IV SOLN
0.0000 ug/kg/min | INTRAVENOUS | Status: DC
  Administered 2019-04-06: 3 ug/kg/min via INTRAVENOUS
  Filled 2019-04-06: qty 20

## 2019-04-06 MED ORDER — HYDROXYCHLOROQUINE SULFATE 200 MG PO TABS
400.0000 mg | ORAL_TABLET | Freq: Two times a day (BID) | ORAL | Status: DC
  Filled 2019-04-06: qty 2

## 2019-04-06 MED ORDER — MIDAZOLAM 50MG/50ML (1MG/ML) PREMIX INFUSION
2.0000 mg/h | INTRAVENOUS | Status: DC
  Administered 2019-04-06: 2 mg/h via INTRAVENOUS
  Filled 2019-04-06: qty 50

## 2019-04-06 MED ORDER — SODIUM CHLORIDE 0.9 % IV SOLN
2.0000 g | Freq: Once | INTRAVENOUS | Status: AC
  Administered 2019-04-06: 2 g via INTRAVENOUS
  Filled 2019-04-06: qty 2

## 2019-04-06 MED ORDER — MIDAZOLAM HCL 2 MG/2ML IJ SOLN
2.0000 mg | INTRAMUSCULAR | Status: DC | PRN
  Administered 2019-04-06: 2 mg via INTRAVENOUS

## 2019-04-06 MED ORDER — NOREPINEPHRINE 4 MG/250ML-% IV SOLN: 2.0000 ug/min | INTRAVENOUS | Status: DC

## 2019-04-06 MED ORDER — FENTANYL CITRATE (PF) 100 MCG/2ML IJ SOLN
50.0000 ug | INTRAMUSCULAR | Status: DC | PRN
  Administered 2019-04-06 (×2): 50 ug via INTRAVENOUS
  Filled 2019-04-06 (×2): qty 2

## 2019-04-06 MED ORDER — FAMOTIDINE IN NACL 20-0.9 MG/50ML-% IV SOLN: 20.0000 mg | Freq: Two times a day (BID) | INTRAVENOUS | Status: DC

## 2019-04-06 MED ORDER — SODIUM CHLORIDE 0.9 % IV SOLN
250.0000 mL | INTRAVENOUS | Status: DC
  Administered 2019-04-06: 10 mL via INTRAVENOUS

## 2019-04-06 MED ORDER — ORAL CARE MOUTH RINSE: 15.0000 mL | OROMUCOSAL | Status: DC

## 2019-04-06 MED ORDER — HYDROXYCHLOROQUINE SULFATE 200 MG PO TABS: 200.0000 mg | ORAL_TABLET | Freq: Two times a day (BID) | ORAL | Status: DC

## 2019-04-06 MED ORDER — VANCOMYCIN HCL 10 G IV SOLR
1500.0000 mg | Freq: Once | INTRAVENOUS | Status: AC
  Administered 2019-04-06: 09:00:00 1500 mg via INTRAVENOUS
  Filled 2019-04-06: qty 1500

## 2019-04-06 MED ORDER — MIDAZOLAM BOLUS VIA INFUSION
1.0000 mg | INTRAVENOUS | Status: DC | PRN
  Administered 2019-04-06: 10:00:00 2 mg via INTRAVENOUS
  Filled 2019-04-06: qty 2

## 2019-04-06 MED ORDER — STERILE WATER FOR INJECTION IJ SOLN
INTRAMUSCULAR | Status: AC
  Administered 2019-04-06: 09:00:00 10 mL
  Filled 2019-04-06: qty 10

## 2019-04-06 MED ORDER — INSULIN ASPART 100 UNIT/ML ~~LOC~~ SOLN
0.0000 [IU] | SUBCUTANEOUS | Status: DC
  Administered 2019-04-06: 4 [IU] via SUBCUTANEOUS
  Administered 2019-04-06 (×2): 7 [IU] via SUBCUTANEOUS

## 2019-04-06 MED ORDER — SODIUM CHLORIDE 0.9 % IV SOLN: 2.0000 g | INTRAVENOUS | Status: DC

## 2019-04-06 MED ORDER — PANTOPRAZOLE SODIUM 40 MG PO PACK
40.0000 mg | PACK | Freq: Every day | ORAL | Status: DC
  Administered 2019-04-06: 13:00:00 40 mg
  Filled 2019-04-06: qty 20

## 2019-04-06 MED ORDER — PHENYLEPHRINE HCL-NACL 10-0.9 MG/250ML-% IV SOLN: 25.0000 ug/min | INTRAVENOUS | Status: DC

## 2019-04-06 MED ORDER — MIDAZOLAM HCL 2 MG/2ML IJ SOLN
4.0000 mg | Freq: Once | INTRAMUSCULAR | Status: AC
  Administered 2019-04-06: 4 mg via INTRAVENOUS

## 2019-04-06 MED ORDER — HEPARIN BOLUS VIA INFUSION (CRRT)
1000.0000 [IU] | INTRAVENOUS | Status: DC | PRN
  Filled 2019-04-06: qty 1000

## 2019-04-06 MED ORDER — PRISMASOL BGK 4/2.5 32-4-2.5 MEQ/L REPLACEMENT SOLN
Status: DC
  Filled 2019-04-06 (×3): qty 5000

## 2019-04-06 MED ORDER — BISACODYL 10 MG RE SUPP: 10.0000 mg | Freq: Every day | RECTAL | Status: DC | PRN

## 2019-04-06 MED ORDER — SODIUM BICARBONATE 8.4 % IV SOLN
INTRAVENOUS | Status: AC
  Filled 2019-04-06: qty 100

## 2019-04-06 MED ORDER — VECURONIUM BROMIDE 10 MG IV SOLR
INTRAVENOUS | Status: AC
  Administered 2019-04-06: 10 mg
  Filled 2019-04-06: qty 10

## 2019-04-06 MED ORDER — SODIUM CHLORIDE 0.9 % IV SOLN
500.0000 mg | Freq: Every day | INTRAVENOUS | Status: DC
  Administered 2019-04-06: 500 mg via INTRAVENOUS
  Filled 2019-04-06: qty 500

## 2019-04-06 MED ORDER — FENTANYL CITRATE (PF) 100 MCG/2ML IJ SOLN
100.0000 ug | Freq: Once | INTRAMUSCULAR | Status: AC
  Administered 2019-04-06: 100 ug via INTRAVENOUS

## 2019-04-06 MED ORDER — SODIUM CHLORIDE 0.9 % IV SOLN
250.0000 [IU]/h | INTRAVENOUS | Status: DC
  Filled 2019-04-06: qty 2

## 2019-04-06 MED ORDER — PRO-STAT SUGAR FREE PO LIQD: 30.0000 mL | Freq: Two times a day (BID) | ORAL | Status: DC

## 2019-04-06 MED ORDER — SEVELAMER CARBONATE 800 MG PO TABS
2400.0000 mg | ORAL_TABLET | Freq: Three times a day (TID) | ORAL | Status: DC
  Administered 2019-04-06 (×2): 2400 mg
  Filled 2019-04-06 (×5): qty 3

## 2019-04-06 MED ORDER — VANCOMYCIN HCL IN DEXTROSE 1-5 GM/200ML-% IV SOLN: 1000.0000 mg | INTRAVENOUS | Status: DC

## 2019-04-06 MED ORDER — FENTANYL CITRATE (PF) 100 MCG/2ML IJ SOLN
50.0000 ug | INTRAMUSCULAR | Status: DC | PRN
  Administered 2019-04-06: 50 ug via INTRAVENOUS
  Administered 2019-04-06 (×2): 100 ug via INTRAVENOUS
  Administered 2019-04-06 (×2): 50 ug via INTRAVENOUS
  Filled 2019-04-06 (×4): qty 2

## 2019-04-06 MED ORDER — FENTANYL CITRATE (PF) 100 MCG/2ML IJ SOLN
INTRAMUSCULAR | Status: AC
  Filled 2019-04-06: qty 2

## 2019-04-06 MED ORDER — CALCIUM ACETATE (PHOS BINDER) 667 MG/5ML PO SOLN
667.0000 mg | Freq: Three times a day (TID) | ORAL | Status: DC
  Administered 2019-04-06 (×2): 667 mg
  Filled 2019-04-06 (×4): qty 5

## 2019-04-06 MED ORDER — PANTOPRAZOLE SODIUM 40 MG IV SOLR
40.0000 mg | Freq: Every day | INTRAVENOUS | Status: DC
  Filled 2019-04-06: qty 40

## 2019-04-06 MED ORDER — DOCUSATE SODIUM 50 MG/5ML PO LIQD: 100.0000 mg | Freq: Two times a day (BID) | ORAL | Status: DC | PRN

## 2019-04-06 MED ORDER — SODIUM BICARBONATE NICU IV INFUSION 0.5 MEQ/ML: 150.0000 meq/h | INTRAVENOUS | Status: DC

## 2019-04-06 MED ORDER — ORAL CARE MOUTH RINSE
15.0000 mL | OROMUCOSAL | Status: DC
  Administered 2019-04-06 (×2): 15 mL via OROMUCOSAL

## 2019-04-06 MED ORDER — ALBUTEROL SULFATE (2.5 MG/3ML) 0.083% IN NEBU: 2.5000 mg | INHALATION_SOLUTION | RESPIRATORY_TRACT | Status: DC | PRN

## 2019-04-06 MED ORDER — SUCCINYLCHOLINE CHLORIDE 20 MG/ML IJ SOLN
INTRAMUSCULAR | Status: AC | PRN
  Administered 2019-04-06: 150 mg via INTRAVENOUS

## 2019-04-06 MED ORDER — VITAL HIGH PROTEIN PO LIQD: 1000.0000 mL | ORAL | Status: DC

## 2019-04-06 MED ORDER — VECURONIUM BROMIDE 10 MG IV SOLR: 10.0000 mg | Freq: Once | INTRAVENOUS | Status: DC

## 2019-04-06 MED ORDER — HEPARIN SODIUM (PORCINE) 1000 UNIT/ML DIALYSIS
1000.0000 [IU] | INTRAMUSCULAR | Status: DC | PRN
  Filled 2019-04-06: qty 6

## 2019-04-06 MED ORDER — CISATRACURIUM BOLUS VIA INFUSION
0.1000 mg/kg | Freq: Once | INTRAVENOUS | Status: AC
  Administered 2019-04-06: 16.8 mg via INTRAVENOUS
  Filled 2019-04-06: qty 17

## 2019-04-06 MED ORDER — ETOMIDATE 2 MG/ML IV SOLN
INTRAVENOUS | Status: AC | PRN
  Administered 2019-04-06 (×2): 20 mg via INTRAVENOUS

## 2019-04-06 MED ORDER — TOCILIZUMAB 400 MG/20ML IV SOLN
800.0000 mg | Freq: Once | INTRAVENOUS | Status: AC
  Administered 2019-04-06: 14:00:00 800 mg via INTRAVENOUS
  Filled 2019-04-06: qty 40

## 2019-04-06 MED ORDER — ARTIFICIAL TEARS OPHTHALMIC OINT
1.0000 "application " | TOPICAL_OINTMENT | Freq: Three times a day (TID) | OPHTHALMIC | Status: DC
  Administered 2019-04-06 (×2): 1 via OPHTHALMIC
  Filled 2019-04-06: qty 3.5

## 2019-04-06 MED ORDER — NOREPINEPHRINE 4 MG/250ML-% IV SOLN
INTRAVENOUS | Status: AC
  Filled 2019-04-06: qty 250

## 2019-04-06 MED ORDER — ENOXAPARIN SODIUM 30 MG/0.3ML ~~LOC~~ SOLN
30.0000 mg | SUBCUTANEOUS | Status: DC
  Administered 2019-04-06: 30 mg via SUBCUTANEOUS
  Filled 2019-04-06: qty 0.3

## 2019-04-06 MED ORDER — PRAVASTATIN SODIUM 40 MG PO TABS
40.0000 mg | ORAL_TABLET | Freq: Every evening | ORAL | Status: DC
  Filled 2019-04-06: qty 1

## 2019-04-06 MED ORDER — MIDAZOLAM HCL 2 MG/2ML IJ SOLN
2.0000 mg | INTRAMUSCULAR | Status: AC | PRN
  Administered 2019-04-06 (×3): 2 mg via INTRAVENOUS
  Filled 2019-04-06 (×4): qty 2

## 2019-04-06 MED ORDER — HYDROMORPHONE HCL 1 MG/ML IJ SOLN
1.0000 mg | Freq: Once | INTRAMUSCULAR | Status: AC
  Administered 2019-04-06: 1 mg via INTRAVENOUS

## 2019-04-06 MED ORDER — SODIUM CHLORIDE 0.9 % IV SOLN
1.0000 mg/h | INTRAVENOUS | Status: DC
  Administered 2019-04-06: 1 mg/h via INTRAVENOUS
  Filled 2019-04-06: qty 5

## 2019-04-06 MED ORDER — SODIUM BICARBONATE 8.4 % IV SOLN
100.0000 meq | Freq: Once | INTRAVENOUS | Status: AC
  Administered 2019-04-06: 100 meq via INTRAVENOUS

## 2019-04-06 MED ORDER — CHLORHEXIDINE GLUCONATE 0.12% ORAL RINSE (MEDLINE KIT)
15.0000 mL | Freq: Two times a day (BID) | OROMUCOSAL | Status: DC
  Administered 2019-04-06: 15 mL via OROMUCOSAL

## 2019-04-06 MED ORDER — NOREPINEPHRINE 4 MG/250ML-% IV SOLN
0.0000 ug/min | INTRAVENOUS | Status: DC
  Administered 2019-04-06: 13:00:00 25 ug/min via INTRAVENOUS
  Filled 2019-04-06: qty 250

## 2019-04-06 MED ORDER — EPINEPHRINE PF 1 MG/ML IJ SOLN
0.5000 ug/min | INTRAVENOUS | Status: DC
  Filled 2019-04-06: qty 4

## 2019-04-06 MED ORDER — MIDODRINE HCL 5 MG PO TABS
10.0000 mg | ORAL_TABLET | Freq: Two times a day (BID) | ORAL | Status: DC
  Administered 2019-04-06 (×2): 10 mg
  Filled 2019-04-06 (×3): qty 2

## 2019-04-06 MED ORDER — HEPARIN SODIUM (PORCINE) 5000 UNIT/ML IJ SOLN
5000.0000 [IU] | Freq: Three times a day (TID) | INTRAMUSCULAR | Status: DC
  Administered 2019-04-06: 06:00:00 5000 [IU] via SUBCUTANEOUS
  Filled 2019-04-06: qty 1

## 2019-04-06 MED ORDER — CHLORHEXIDINE GLUCONATE 0.12% ORAL RINSE (MEDLINE KIT): 15.0000 mL | Freq: Two times a day (BID) | OROMUCOSAL | Status: DC

## 2019-04-06 MED ORDER — ROCURONIUM BROMIDE 50 MG/5ML IV SOLN
INTRAVENOUS | Status: AC | PRN
  Administered 2019-04-06: 100 mg via INTRAVENOUS

## 2019-04-06 MED ORDER — ONDANSETRON HCL 4 MG/2ML IJ SOLN: 4.0000 mg | Freq: Four times a day (QID) | INTRAMUSCULAR | Status: DC | PRN

## 2019-04-07 ENCOUNTER — Telehealth: Payer: Self-pay

## 2019-04-07 NOTE — Telephone Encounter (Signed)
Received dc from [employer] (faxed). DC is for cremation. Patient is a patient of Doctor Ramaswamy.   DC will be taken to [place] for signature.  On 04/08/2019 Received signed dc from [facility] [place]. I faxed a copy to the funeral home per the funeral home request.

## 2019-04-08 LAB — BPAM FFP
Blood Product Expiration Date: 202005121625
Unit Type and Rh: 7300

## 2019-04-08 LAB — PREPARE FRESH FROZEN PLASMA

## 2019-04-08 LAB — CULTURE, BLOOD (ROUTINE X 2)
Culture: NO GROWTH
Culture: NO GROWTH
Special Requests: ADEQUATE
Special Requests: ADEQUATE

## 2019-04-10 ENCOUNTER — Telehealth: Payer: Self-pay

## 2019-04-10 NOTE — Telephone Encounter (Signed)
Received dc from [employer].  DC is for cremation and it is the original copy.  DC will be taken to [facility] for signature.  On 04/11/2019  Received dc back from [facility] [place]. I called the funeral home to let them know it was ready and per their request mailed the original copy to vital records.

## 2019-04-11 LAB — CULTURE, BLOOD (ROUTINE X 2)
Culture: NO GROWTH
Culture: NO GROWTH

## 2019-04-17 ENCOUNTER — Ambulatory Visit: Payer: Medicare Other | Admitting: Podiatry

## 2019-04-28 NOTE — H&P (Signed)
NAME:  [name], MRN:  [id number], DOB:  05-10-1961, LOS: 0 ADMISSION DATE:  Apr 08, 2019, CONSULTATION DATE:  04-08-2019 REFERRING MD:  Christy Gentles - EM, CHIEF COMPLAINT:  SOB  Brief History   58 yo F COVID + and ESRD presenting with worsening SOB, on 15L NRB in ED SpO2 88%  History of present illness   58 yo F PMH HTN, HLD, DM2, ESRD on m/w/f iHD, OSA, Afib, diastolic HF, COVID+ on 5/7 who left ED AMA, presents 5/10 with worsening SOB. Patient's husband progressively has been increasing her home O2 (baseline 2L) before calling EMS for transport to ED at [facility]. In ED, patient on 15L NRB and SpO2 88%. CXR reveals bilateral opacities, worse from prior on 5/7.  Patient is febrile T 103, WBC 17.3, PCT 17.94, CRP 23.4, Lactic acid 2.9, BNP 640, LDH 304, D-Dimer 2.54 Patient will be intubated in ED and admitted to ICU.   Past Medical History  ESRD CHF A Fib HTn DM2 OSA COVID 19 GERD Cardiac arrest Morbid obesity Hypothyroidism   Significant Hospital Events   5/7> COVID 19 positive, left ED AMA 5/10 >Presents with worsening SOB, intubated, admit to ICU   Consults:  Neprhrology   Procedures:  5/10> ETT>>>  Significant Diagnostic Tests:  5/10 CXR> diffuse bilateral opacities   Micro Data:  5/7 COVID 19 Positive 5/10 BCx >>>   Antimicrobials:  5/10 vanc>> 5/10 Cefepime >> 5/10 Azithromycin >> 5/10 Plaquenil >>   Interim history/subjective:  Intubating in ED  Objective   Blood pressure (!) 132/51, pulse (!) 114, temperature (!) 103.1 F (39.5 C), temperature source Oral, resp. rate (!) 37, height 5' 6.5" (1.689 m), weight (!) 168 kg, last menstrual period 06/13/2013, SpO2 (!) 88 %.       No intake or output data in the 24 hours ending 04-08-2019 0235 Filed Weights   04/08/2019 0119  Weight: (!) 168 kg    Examination: General: Morbidly obese adult female, moderate respiratory distress on NRB HENT: NCAT pink mmm trachea midline Lungs: Symmetrical chest  expansion, some accessory muscle recruitment, no nasal flaring. Diminished breath sounds.  Cardiovascular: Sinus tachycardia. Distant heart sounds Abdomen: Obese, round, ndnt Extremities: Symmetrical bulk and tone no obvious MSK deformity. No cyanosis  Neuro: Awake, drowsy, oriented to self and situation. Following commands GU: due to void   Resolved Hospital Problem list     Assessment & Plan:   Acute hypoxemic respiratory failure requiring mechanical ventilation COVID-19 PNA COVID 19 Positive OSA On home 2LNC Possible component of pulm edema 2/2 ESRD, CHF  -In ED patient SpO2 86-89% on 15L NRB P Intubate Follow up CXR ABG in 1 hr [facility]  Protocol  Pulm hygiene Will consult nephrology RE possible component of pulm edema Discussed with Dr Loyce Dys-- initiating azithromycin/plaquenil  APAP for fever Send tracheal aspirate once intubated  Admit to ICU   Hypotension -Septic Shock vs RSI medication induced P MAP goal > 65 NE peripherally   ESRD on m/w/f dialysis Associated hyperkalemia  P Consult nephrology in AM regarding inpatient fluid removal Midodrine 10mg  BID  Follow BMP, mag phos  EKG PRN, Hyperkalemia treatment PRN Sevelamer  CHF P ECHO Favor limiting IVF at this time Telemetry Holding antihypertensives at this time   DM2 P SSI  Skin/Soft Tissue infection Recently discharged from hospital late April with home abx P Vanc/Cefepime Trend WBC, temp, monitor for signs of worsening infection WOCN consult  HLD Pravastatin    Best practice:  Diet: NPO Pain/Anxiety/Delirium  protocol (if indicated): PRN fent PRN versed  VAP protocol (if indicated): yes DVT prophylaxis: SQ Heparin GI prophylaxis: Protonix Glucose control: SSI Mobility: bedrest Code Status: FULL Family Communication: Husband updated on phone Disposition: Admit to ICU   Labs   CBC: Recent Labs  Lab 04/03/19 1240 04-18-2019 0121 Apr 18, 2019 0126  WBC 8.7 17.3*  --   NEUTROABS 6.8  16.1*  --   HGB 8.7* 9.3* 11.6*  HCT 31.3* 33.1* 34.0*  MCV 110.6* 109.6*  --   PLT 237 294  --     Basic Metabolic Panel: Recent Labs  Lab 04/03/19 1240 Apr 18, 2019 0121 Apr 18, 2019 0126  NA 136 133* 133*  K 4.2 5.2* 5.2*  CL 99 93*  --   CO2 21* 20*  --   GLUCOSE 155* 236*  --   BUN 75* 56*  --   CREATININE 12.35* 10.68*  --   CALCIUM 8.5* 8.7*  --    GFR: Estimated Creatinine Clearance: 9.5 mL/min (A) (by C-G formula based on SCr of 10.68 mg/dL (H)). Recent Labs  Lab 04/03/19 1240 Apr 18, 2019 0120 04-18-2019 0121  PROCALCITON  --   --  17.94  WBC 8.7  --  17.3*  LATICACIDVEN 0.9 2.9*  --     Liver Function Tests: Recent Labs  Lab 04/03/19 1240 Apr 18, 2019 0121  AST 20 36  ALT 12 12  ALKPHOS 78 79  BILITOT 0.5 0.8  PROT 8.0 9.4*  ALBUMIN 2.3* 2.5*   Recent Labs  Lab 04/03/19 1240  LIPASE 82*   No results for input(s): AMMONIA in the last 168 hours.  ABG    Component Value Date/Time   PHART 7.346 (L) 01/27/2019 2029   PCO2ART 44.2 01/27/2019 2029   PO2ART 115.0 (H) 01/27/2019 2029   HCO3 21.4 18-Apr-2019 0126   TCO2 23 04-18-19 0126   ACIDBASEDEF 5.0 (H) 04-18-19 0126   O2SAT 44.0 04/18/2019 0126     Coagulation Profile: No results for input(s): INR, PROTIME in the last 168 hours.  Cardiac Enzymes: No results for input(s): CKTOTAL, CKMB, CKMBINDEX, TROPONINI in the last 168 hours.  HbA1C: Hgb A1c MFr Bld  Date/Time Value Ref Range Status  12/22/2014 05:54 AM 5.3 <5.7 % Final    Comment:    (NOTE)                                                                       According to the ADA Clinical Practice Recommendations for 2011, when HbA1c is used as a screening test:  >=6.5%   Diagnostic of Diabetes Mellitus           (if abnormal result is confirmed) 5.7-6.4%   Increased risk of developing Diabetes Mellitus References:Diagnosis and Classification of Diabetes Mellitus,Diabetes RXYV,8592,92(KMQKM 1):S62-S69 and Standards of Medical Care in          Diabetes - 2011,Diabetes MNOT,7711,65 (Suppl 1):S11-S61.   07/17/2013 07:50 AM 5.7 (H) <5.7 % Final    Comment:    (NOTE)  According to the ADA Clinical Practice Recommendations for 2011, when HbA1c is used as a screening test:  >=6.5%   Diagnostic of Diabetes Mellitus           (if abnormal result is confirmed) 5.7-6.4%   Increased risk of developing Diabetes Mellitus References:Diagnosis and Classification of Diabetes Mellitus,Diabetes WNIO,2703,50(KXFGH 1):S62-S69 and Standards of Medical Care in         Diabetes - 2011,Diabetes WEXH,3716,96 (Suppl 1):S11-S61.    CBG: No results for input(s): GLUCAP in the last 168 hours.  Review of Systems:   Endorses SOB, fatigue, fever, cough Denies n/v/d Denies chest pain, palpitations, edema   Past Medical History  She,  has a past medical history of Chronic diastolic CHF (congestive heart failure) ([place]), DCM (dilated cardiomyopathy) ([place]), Depression, DM type 2 (diabetes mellitus, type 2) ([place]), ESRD on hemodialysis ([address]) (08/21/2017), GERD (gastroesophageal reflux disease), H/O cardiac arrest (10/2012), Hard of hearing (10/2012), History of hemodialysis (dec 2013), HTN (hypertension), Hydradenitis, Hyperkalemia, Hyperlipidemia, Hypothyroidism, Iron deficiency anemia, Morbid obesity ([place]), Multinodular goiter, Neuropathic pain (08/21/2017), Orthostatic hypotension (06/27/2018), PAF (paroxysmal atrial fibrillation) ([place]), Paroxysmal atrial flutter ([place]), Pneumonia (Nov 10, 2012), Sleep apnea, Tinnitus (12/26/2016), Typical atrial flutter ([place]) (04/10/2017), and Urinary tract infection.   Surgical History    Past Surgical History:  Procedure Laterality Date  . AV FISTULA PLACEMENT Left 07/25/2013   Procedure: ARTERIOVENOUS (AV) FISTULA CREATION ;  Surgeon: Rosetta Posner, MD;  Location: [place];  Service: Vascular;  Laterality: Left;  . AV FISTULA PLACEMENT Left 10/10/2018    Procedure: INSERTION OF LEFT UPPER ARM brachial to axillary artery ARTERIOVENOUS (AV) GORE-TEX GRAFT using 4-7 stretch graft;  Surgeon: Waynetta Sandy, MD;  Location: [place];  Service: Vascular;  Laterality: Left;  . CYSTOSCOPY W/ RETROGRADES  11/16/2012   Procedure: CYSTOSCOPY WITH RETROGRADE PYELOGRAM;  Surgeon: Reece Packer, MD;  Location: [facility];  Service: Urology;  Laterality: Bilateral;  CYSTOSCOPY,BILATERAL RETROGRADE PYELOGRAM/ REMOVAL LEFT URETERAL STENT/ FULGERATION BLADDER MUCOSA/ INSERTION RIGHT URETERAL STENT  . CYSTOSCOPY W/ URETERAL STENT PLACEMENT  05/28/2012   Procedure: CYSTOSCOPY WITH RETROGRADE PYELOGRAM/URETERAL STENT PLACEMENT;  Surgeon: Reece Packer, MD;  [facility];  Service: Urology;  Laterality: Left;  . CYSTOSCOPY WITH RETROGRADE PYELOGRAM, URETEROSCOPY AND STENT PLACEMENT Right 06/23/2013   Procedure: CYSTOSCOPY WITH RIGHT URETEROSCOPY, RIGHT  RETROGRADE PYELOGRAM, WITH LASER LIPOTRIPSY AND RIGHT URETERAL STENT EXCHANGE ;  Surgeon: Molli Hazard, MD;  Location[facility];  Service: Urology;  Laterality: Right;  STENT EXCHANGE    . CYSTOSCOPY WITH RETROGRADE PYELOGRAM, URETEROSCOPY AND STENT PLACEMENT Right 11/06/2013   Procedure: CYSTOSCOPY WITH RETROGRADE PYELOGRAM, URETEROSCOPY STONE REMOVAL AND STENT REMOVAL;  Surgeon: Molli Hazard, MD;  [facility];  Service: Urology;  Laterality: Right;  . HOLMIUM LASER APPLICATION N/A 7/89/3810   Procedure: HOLMIUM LASER APPLICATION;  Surgeon: Molli Hazard, MD;  Location: [facility];  Service: Urology;  Laterality: N/A;  . INSERTION OF DIALYSIS CATHETER N/A 09/24/2013   Procedure: INSERTION OF DIALYSIS CATHETER;  Surgeon: Rosetta Posner, MD;  Location[facility];  Service: Vascular;  Laterality: N/A;  . IR AV DIALY SHUNT INTRO NEEDLE/INTRAC INITIAL W/PTA/STENT/IMG LT Left 08/13/2018  . IR AV DIALY SHUNT INTRO NEEDLE/INTRACATH INITIAL W/PTA/IMG LEFT  04/19/2018  . IR AV DIALY SHUNT INTRO  NEEDLE/INTRACATH INITIAL W/PTA/IMG LEFT  08/14/2018  . IR FLUORO GUIDE CV LINE RIGHT  09/17/2018  . IR FLUORO GUIDE CV LINE RIGHT  09/20/2018  . IR US GUIDE VASC ACCESS  LEFT  04/19/2018  . IR US GUIDE VASC ACCESS LEFT  08/13/2018  . IR US GUIDE VASC ACCESS LEFT  08/14/2018  . IR US GUIDE VASC ACCESS RIGHT  09/17/2018  . PORT-A-CATH REMOVAL    . PORTACATH PLACEMENT    . REVISON OF ARTERIOVENOUS FISTULA Left 07/25/2016   Procedure: REVISON OF LEFT ARTERIOVENOUS FISTULA;  Surgeon: Elam Dutch, MD;  Location: [place];  Service: Vascular;  Laterality: Left;  . THROMBECTOMY AND REVISION OF ARTERIOVENTOUS (AV) GORETEX  GRAFT Left 10/10/2018   Procedure: attempted of THROMBECTOMY OF ARTERIOVENTOUS FISTULA LEFT ARM;  Surgeon: Waynetta Sandy, MD;  Location: [place];  Service: Vascular;  Laterality: Left;     Social History   reports that she quit smoking about 39 years ago. Her smoking use included cigarettes. She has a 0.25 pack-year smoking history. She has never used smokeless tobacco. She reports that she does not drink alcohol or use drugs.   Family History   Her family history includes Cancer in her maternal grandfather; Coronary artery disease in her father and mother; Diabetes type II in her mother; Hypertension in her father and mother; Kidney failure in her maternal grandmother; Malignant hyperthermia in her father.   Allergies Allergies  Allergen Reactions  . Rosiglitazone Other (See Comments)    "Avandia" = Reaction not recalled  . Amoxicillin Rash and Other (See Comments)    Tolerated Zosyn 01/2013. [place] Has patient had a PCN reaction causing immediate rash, facial/tongue/throat swelling, SOB or lightheadedness with hypotension: Yes Has patient had a PCN reaction causing severe rash involving mucus membranes or skin necrosis: No Has patient had a PCN reaction that required hospitalization: No Has patient had a PCN reaction occurring within the last 10 years: No If all  of the above answers are "NO", then may proceed with Cephalosporin use.   Marland Kitchen Amoxicillin-Pot Clavulanate Diarrhea     Home Medications  Prior to Admission medications   Medication Sig Start Date End Date Taking? Authorizing Provider  acetaminophen (TYLENOL) 500 MG tablet Take 1,000 mg by mouth daily as needed for moderate pain.    [provider]  aspirin EC 81 MG tablet Take 81 mg by mouth at bedtime.     [provider]  B Complex-C-Folic Acid (NEPHRO-VITE PO) Take 1 tablet by mouth daily.    [provider]  Calcium Acetate 667 MG TABS Take 667 mg by mouth See admin instructions. Take 667 mg by mouth three times a day with meals and 667 mg with each snack    [provider]  Calcium Alginate-Silver (ANTIBACTERIAL ALGINATE W/SILV) 4.25"X4.25" PADS Apply 1 application topically daily.    [provider]  ceFEPIme 2 g in sodium chloride 0.9 % 100 mL Inject 2 g into the vein every Monday, Wednesday, and Friday with hemodialysis. 03/26/19   Black, Lezlie Octave, NP  Dakins (SODIUM HYPOCHLORITE) 0.25 % SOLN Apply 1 application topically See admin instructions. Apply to wounds as directed 2 times daily    [provider]  diclofenac sodium (VOLTAREN) 1 % GEL Apply 1 application topically as needed. 03/27/19   [provider]  diltiazem (CARDIZEM) 30 MG tablet Take 1 tablet every 4 hours AS NEEDED for rapid heart rate lasting more than 30 mins as long as top blood pressure >100. Patient taking differently: Take 30 mg by mouth every 4 (four) hours as needed. Take 30 mg by mouth every four hours as needed for rapid heart rate lasting more  than 30 minutes, as long as Systolic number ("top blood pressure") is >100 03/09/17   Sherran Needs, NP  diphenhydrAMINE (BENADRYL) 25 MG tablet Take 25 mg by mouth daily as needed for allergies.    [provider]  DULoxetine (CYMBALTA) 60 MG capsule Take 1 capsule (60 mg total) by mouth daily. 12/03/12    Janece Canterbury, MD  famotidine (PEPCID) 20 MG tablet TAKE 1 TABLET BY MOUTH AT BEDTIME Patient taking differently: Take 20 mg by mouth at bedtime.  10/15/14   Tanda Rockers, MD  feeding supplement, RESOURCE BREEZE, (RESOURCE BREEZE) LIQD Take 1 Container by mouth daily as needed (for meal supplementation).     [provider]  glipiZIDE (GLUCOTROL) 5 MG tablet Take 0.5 tablets (2.5 mg total) by mouth daily. 08/31/16   Debbe Odea, MD  levothyroxine (SYNTHROID, LEVOTHROID) 75 MCG tablet Take 75 mcg by mouth daily before breakfast.  08/17/15   [provider]  metroNIDAZOLE (FLAGYL) 500 MG tablet Take 1 tablet (500 mg total) by mouth 3 (three) times daily for 14 days. 03/25/19 04/08/19  Geradine Girt, DO  midodrine (PROAMATINE) 10 MG tablet Take 1 tablet (10 mg total) by mouth 3 (three) times daily. Patient taking differently: Take 10 mg by mouth See admin instructions. Take 10 mg by mouth in the morning on Mon/Wed/Fri ONLY twice a day- before dialysis 08/31/16   Debbe Odea, MD  Multiple Vitamin (MULTIVITAMIN WITH MINERALS) TABS tablet Take 1 tablet by mouth daily.    [provider]  oxyCODONE-acetaminophen (PERCOCET/ROXICET) 5-325 MG tablet Take 1-2 tablets by mouth every 6 (six) hours as needed. Patient has not taken since started dialysis 08/2013 due to causes hypotension Patient taking differently: Take 2 tablets by mouth every 6 (six) hours as needed for moderate pain. Take in the afternoon after dialysis on dialysis days 07/25/16   Virgina Jock A, PA-C  pravastatin (PRAVACHOL) 40 MG tablet Take 40 mg by mouth every evening.  12/03/12   Janece Canterbury, MD  Probiotic Product (PROBIOTIC DAILY PO) Take 1 capsule by mouth 2 (two) times daily.     [provider]  sevelamer carbonate (RENVELA) 800 MG tablet Take 2,400 mg by mouth 3 (three) times daily with meals.    [provider]  sucroferric oxyhydroxide (VELPHORO) 500 MG chewable tablet Chew  500-1,000 mg by mouth See admin instructions. Chew 1,000 mg by mouth with each meal three times a day and 500 mg with each snack    [provider]     Critical care time: 60 min      Eliseo Gum MSN, AGACNP-BC [place] [phone] If no answer, [phone] 04-25-2019, 2:35 AM

## 2019-04-28 NOTE — Progress Notes (Signed)
    PAtient died - broke news to husband. He was in tears and verbalized understanding     SIGNATURE    Dr. Brand Males, M.D., F.C.C.P,  Pulmonary and Critical Care Medicine Staff Physician, [place] Director - Interstitial Lung Disease  Program  Pulmonary [place] [facility] [place], [place], [address]  Pager: [phone], If no answer or between  15:00h - 7:00h: call [phone] Telephone: [phone]  3:59 PM 12-Apr-2019

## 2019-04-28 NOTE — Progress Notes (Signed)
Pharmacy Antibiotic Note  [name] is a 58 y.o. female, COVID+ on 5/7, admitted on 05-May-2019 with SOB.  Pharmacy has been consulted for Vancomycin and Cefepime dosing.  Pt has been receiving Vancomycin in dialysis, last HD 5/8.  Vancomycin level this morning 10  Plan: Vancomycin 1500 mg IV now, then 1 g IV after each HD Cefepime 2 g IV now and after each HD  Height: 5' 6.5" (168.9 cm) Weight: (!) 370 lb 6 oz (168 kg) IBW/kg (Calculated) : 60.45  Temp (24hrs), Avg:103 F (39.4 C), Min:102.9 F (39.4 C), Max:103.1 F (39.5 C)  Recent Labs  Lab 04/03/19 1240 May 05, 2019 0120 05-05-2019 0121 05/05/19 0613 05/05/2019 0619  WBC 8.7  --  17.3*  --   --   CREATININE 12.35*  --  10.68*  --   --   LATICACIDVEN 0.9 2.9*  --  1.3  --   VANCORANDOM  --   --   --   --  10    Estimated Creatinine Clearance: 9.5 mL/min (A) (by C-G formula based on SCr of 10.68 mg/dL (H)).    Allergies  Allergen Reactions  . Rosiglitazone Other (See Comments)    "Avandia" = Reaction not recalled  . Amoxicillin Rash and Other (See Comments)    Tolerated Zosyn 01/2013. [place] Has patient had a PCN reaction causing immediate rash, facial/tongue/throat swelling, SOB or lightheadedness with hypotension: Yes Has patient had a PCN reaction causing severe rash involving mucus membranes or skin necrosis: No Has patient had a PCN reaction that required hospitalization: No Has patient had a PCN reaction occurring within the last 10 years: No If all of the above answers are "NO", then may proceed with Cephalosporin use.   Marland Kitchen Amoxicillin-Pot Clavulanate Diarrhea      [name] May 05, 2019 7:20 AM

## 2019-04-28 NOTE — Progress Notes (Addendum)
Pt made Partial code status. BP continued to drop, SpO2 picking up sporadically and frequent EKG changes. Pt continued to decline and lost pulse. No HR verified by 2 RN's and ventilator was turned off at 1540.

## 2019-04-28 NOTE — ED Triage Notes (Signed)
BIB EMS from home. Incr'd SOB thoughtout day today, continually telling husband to incr her home O2. Diminished lung sounds throughout. +COVID test on Thursday this week. Pt states last dialysis on Friday.

## 2019-04-28 NOTE — Procedures (Signed)
Patient with right subclavian HD access, in need of access for multiple infusions.  Notations in access given the location of her HD catheter, femoral access seemed be the best temporary choice.  Right groin/femoral compartment had substantial skin breakdown, and what appeared to be superficial infection.  We were thus left with only the left femoral access.  Ultrasound guidance, the femoral vein was accessed 4 times, but as the wire passed a couple of centimeters with each access, further advancement was stopped.  Drawing the wire back demonstrated clot.  At one point, access was obtained but the blood clotted in the needle and the syringe prior to being able to even attempt to pass the wire.  Attempts were abandoned when in a longitudinal/long axis u/s view, the femoral vein appeared to have some degree of thrombus in it, with marginal/collateral flow.  Bonna Gains, MD PhD 04-23-2019 4:35 PM

## 2019-04-28 NOTE — Progress Notes (Addendum)
   Called husband 05-03-19 3:05 PM for an updated  Explained MODS Explained impossilbe access state - Dr Maryjean Ka tried many times without success - threaded but guidewire would not pass + sever covid clot  Now progressing into refractory shock  Plan  - d/w husband - recommended DNR - he agreed - no CPR, no defib. No acls chemical code  - full medical care - told he we are going to be aggressive to point of CPR trying to ge patient better but CRP > 20 + ferriting > 7000 + morbid obesity + esrd + MODS + recent high trop make it hard for her to survive  - addd pressors  - if stable will try to get central access + try to cRRT and + try to prone -    GRIM PROGNOSIS  - doubt will survive     SIGNATURE    Dr. Brand Males, M.D., F.C.C.P,  Pulmonary and Critical Care Medicine Staff Physician, [place] Director - Interstitial Lung Disease  Program  Pulmonary [place] [facility] [place], [place], [address]  Pager: [phone], If no answer or between  15:00h - 7:00h: call [phone] Telephone: [phone]  3:10 PM 03-May-2019

## 2019-04-28 NOTE — Progress Notes (Signed)
Wasted 55 mL of dilaudid and 40 mL of midazolam down sink from patient's gtt bags.  Waste has been witnessed with Environmental consultant

## 2019-04-28 NOTE — Progress Notes (Signed)
Brief Nutrition Note  Consult received for enteral/tube feeding initiation and management.  Adult Enteral Nutrition Protocol initiated. Full assessment to follow.  Admitting Dx: ESRD (end stage renal disease) ([place]) [N18.6] Acute respiratory failure with hypoxia ([place]) [J96.01] Pneumonia due to COVID-19 virus [U07.1, J12.89]  Body mass index is 58.88 kg/m. Pt meets criteria for obesity class III based on current BMI.  Labs:  Recent Labs  Lab 04/03/19 1240 10-Apr-2019 0121  04-10-19 0603 2019/04/10 0617 2019-04-10 0817  NA 136 133*   < > 133* 133* 133*  K 4.2 5.2*   < > 5.7* 5.7* 5.7*  CL 99 93*  --   --  91*  --   CO2 21* 20*  --   --  23  --   BUN 75* 56*  --   --  57*  --   CREATININE 12.35* 10.68*  --   --  11.03*  --   CALCIUM 8.5* 8.7*  --   --  8.4*  --   MG  --   --   --   --  2.5*  --   PHOS  --   --   --   --  7.7*  --   GLUCOSE 155* 236*  --   --  252*  --    < > = values in this interval not displayed.    Willey Blade, MS, RD, LDN Pager: [phone] After Hours/Weekend Pager: [phone]

## 2019-04-28 NOTE — Code Documentation (Signed)
IV infiltrated.

## 2019-04-28 NOTE — Progress Notes (Signed)
Spoke with [name] about Expanded Access to Convalescent Plasma for the Treatment of Patients with COVID-19.  He informed me he had spoken with Dr Chase Caller abouth Therapy. He said he consented to Therapy and understood risk and benefits of Therapy. His cell number is [phone] and home number is [phone].

## 2019-04-28 NOTE — Progress Notes (Signed)
Critical ABG results given to Dr Chase Caller and Saralyn Pilar RN. Verbal order received to obtain another blood gas after bicarb administered.

## 2019-04-28 NOTE — Progress Notes (Signed)
Patient transported from ED23 to 5Y78 with no complications.

## 2019-04-28 NOTE — Progress Notes (Signed)
I, Brand Males, MD, consented Subject [name] (female, Date of Birth 11-13-1961, 58 y.o.) and with diagnosis of COVID-19, in the [facility] Expanded Access Program (EAP) Research Protocol for [employer] against COVID-19.  The consent took place under following circumstances.   Subject Capacity assessed by this investigator as:  Absence of emotional and mental capacity to consent (i.e. delirium, coma, ventilator).  Consent took place in the following setting(s):  Via video and/or telephone (Time of call: morning approximately 9am April 30, 2019)   The following were present for the consent process:  Investigator Impartial Witness, identified as Production designer, theatre/television/film, contact number [phone], identified as Kaavya Puskarich, has signed and dated the consent.  The consent has been added to subject's chart.   A copy of the cover letter and signed consent document was provided to subject/LAR.  The original signed consent document has been placed in the subject's physical chart and will be scanned into the electronic medical record upon discharge.  Statement of acknowledgement that the following was discussed with the subject/LAR:    1) Discussed the purpose of the research and procedures  2) Discussed risks and benefits and uncertainties of study participation 3) Discussed subject's responsibilities  4) Discussed the measures in place to maintain subject's confidentiality while a participant on the trial  5) Discussed alternatives to study participation.   6) Discussed study participation is voluntary and that the subject's care would not be jeopardized if they declined participation in the study.   7) Discussed freedom to withdraw at any time.   8) All subject/LAR questions were answered to their satisfaction.   9) In case of emergency consent, investigator agreed to discuss with subject/LAR at earliest available opportunity when the subject  stabilizes and/or LAR can be located.     Final Investigator Signature  Brand Males, [place] [id number]   Date: Apr 30, 2019 and 1:32 PM     SIGNATURE    Dr. Brand Males, M.D., F.C.C.P, ACRP-CPI Pulmonary and Critical Care Medicine Research Investigator, [facility] [place] Staff Physician, [place] Director - Interstitial Lung Disease  Program  P[facility] [place], [place], [address]  Pager: [phone], If no answer or between  15:00h - 7:00h: call [phone] Telephone: [phone]  1:35 PM 30-Apr-2019

## 2019-04-28 NOTE — Progress Notes (Addendum)
Pt arrived to floor with 1 IV in right hand. IV team unable to get another IV. [place] notified. Will talk to team about IV access.  Also notified of QTC of 510: Plaquenil held

## 2019-04-28 NOTE — ED Notes (Signed)
Pt insisting to sit up on side of bed. Continues to be acutely anxious, somewhat confused. Trying to pull NRB off. MD aware.

## 2019-04-28 NOTE — Consult Note (Addendum)
Reason for Consult: ESRD Referring Physician:  Dr. Oswald Hillock  Chief Complaint: Shortness of breath  Dialysis orders: GO 4hr 45 min [facility] TC  EDW 154.5kg (left at 157.1 5/8)  Sensipar 120mg  PO TIW Mircera 200 q2weeks 5/8 Calcitriol 2.5 PO TIW Heparin 5000 bolus and 5000 mid treatment  Assessment/Plan: 1. ESRD - last treatment Fri for 4h42min treatment leaving above her dry weight. - Will initiate CRRT with heparin with UF as tolerated. May need conversion to systemic heparin as well if she starts clotting the system. 2. Respiratory failure with COVID positive - on vent currently w/ concerns for ARDS. High DD, ferritin, CRP, fibrinogen. Being treated w/ Hydroxychloroquine, Zithro, Cefepime added as well 3. DM 4. Diastolic heart failure 5. Renal osteodystrophy - will trend phos - binders ok if she's getting TF. 6. Anemia - transfuse as     HPI: [name] is an 58 y.o. female ESRD MWF last HD Fri [facility], DM cardiomyopathy (diastolic w/ EF >56%), paroxysmal afib,  Hidradenitis suppurativa, COPD with 2L O2 at home, morbid obesity recently admitted and tested positive for COVID19 on May 7th. She left the hospital AMA and reportedly had been worsening but she tolerated HD on Fri feeling better as far as her respiratory status after the treatment. She was very volume overloaded during that treatment and midway thru the treatment was reportedly breathing more comfortably. She was   ROS Review of systems not obtained due to patient factors.  Chemistry and CBC: Creat  Date/Time Value Ref Range Status  03/07/2012 04:52 PM 1.90 (H) 0.50 - 1.10 mg/dL Final  07/26/2011 01:53 PM 1.58 (H) 0.50 - 1.10 mg/dL Final  05/22/2011 10:41 AM 1.67 (H) 0.50 - 1.10 mg/dL Final   Creatinine, Ser  Date/Time Value Ref Range Status  2019/04/12 06:17 AM 11.03 (H) 0.44 - 1.00 mg/dL Final  Apr 12, 2019 01:21 AM 10.68 (H) 0.44 - 1.00 mg/dL Final  04/03/2019 12:40 PM 12.35 (H) 0.44 - 1.00 mg/dL Final   03/25/2019 07:29 AM 13.80 (H) 0.44 - 1.00 mg/dL Final  03/24/2019 04:56 PM 12.52 (H) 0.44 - 1.00 mg/dL Final  01/30/2019 08:25 PM 7.25 (H) 0.44 - 1.00 mg/dL Final  01/29/2019 03:52 AM 9.40 (H) 0.44 - 1.00 mg/dL Final  01/28/2019 03:18 AM 7.63 (H) 0.44 - 1.00 mg/dL Final  01/27/2019 08:12 PM 7.36 (H) 0.44 - 1.00 mg/dL Final  09/20/2018 07:03 AM 10.17 (H) 0.44 - 1.00 mg/dL Final  09/19/2018 04:25 AM 7.83 (H) 0.44 - 1.00 mg/dL Final    Comment:    DELTA CHECK NOTED  09/18/2018 04:29 AM 13.36 (H) 0.44 - 1.00 mg/dL Final  09/17/2018 12:06 PM 12.30 (H) 0.44 - 1.00 mg/dL Final  09/14/2018 06:37 AM 5.80 (H) 0.44 - 1.00 mg/dL Final  02/03/2017 05:22 AM 10.45 (H) 0.44 - 1.00 mg/dL Final  02/02/2017 11:09 AM 9.38 (H) 0.44 - 1.00 mg/dL Final  08/31/2016 09:06 AM 10.19 (H) 0.44 - 1.00 mg/dL Final  08/30/2016 07:49 AM 11.59 (H) 0.44 - 1.00 mg/dL Final  08/29/2016 04:02 AM 9.63 (H) 0.44 - 1.00 mg/dL Final  08/28/2016 07:24 AM 11.15 (H) 0.44 - 1.00 mg/dL Final  08/27/2016 05:40 AM 9.54 (H) 0.44 - 1.00 mg/dL Final  08/26/2016 08:45 AM 10.42 (H) 0.44 - 1.00 mg/dL Final  08/25/2016 08:24 PM 9.55 (H) 0.44 - 1.00 mg/dL Final  08/25/2016 11:31 AM 8.74 (H) 0.44 - 1.00 mg/dL Final  05/02/2015 06:13 AM 7.77 (H) 0.44 - 1.00 mg/dL Final  05/01/2015 02:20 PM 6.71 (H)  0.44 - 1.00 mg/dL Final  04/30/2015 06:47 PM 4.80 (H) 0.44 - 1.00 mg/dL Final  12/22/2014 05:54 AM 6.29 (H) 0.50 - 1.10 mg/dL Final  12/21/2014 05:06 PM 4.62 (H) 0.50 - 1.10 mg/dL Final  11/06/2013 10:00 AM 3.89 (H) 0.50 - 1.10 mg/dL Final  07/30/2013 05:20 AM 6.86 (H) 0.50 - 1.10 mg/dL Final  07/29/2013 08:51 AM 6.98 (H) 0.50 - 1.10 mg/dL Final  07/28/2013 04:50 AM 6.77 (H) 0.50 - 1.10 mg/dL Final  07/27/2013 05:35 AM 6.62 (H) 0.50 - 1.10 mg/dL Final  07/26/2013 08:05 AM 6.32 (H) 0.50 - 1.10 mg/dL Final  07/25/2013 06:00 AM 6.00 (H) 0.50 - 1.10 mg/dL Final  07/24/2013 04:21 AM 6.01 (H) 0.50 - 1.10 mg/dL Final  07/23/2013 07:08 AM 5.82 (H)  0.50 - 1.10 mg/dL Final  07/22/2013 05:00 AM 5.85 (H) 0.50 - 1.10 mg/dL Final  07/21/2013 06:50 AM 5.66 (H) 0.50 - 1.10 mg/dL Final  07/20/2013 07:15 AM 5.60 (H) 0.50 - 1.10 mg/dL Final  07/19/2013 05:00 AM 5.52 (H) 0.50 - 1.10 mg/dL Final  07/18/2013 08:51 AM 5.09 (H) 0.50 - 1.10 mg/dL Final  07/18/2013 05:00 AM 5.09 (H) 0.50 - 1.10 mg/dL Final  07/17/2013 03:34 PM 4.95 (H) 0.50 - 1.10 mg/dL Final  07/17/2013 07:50 AM 4.91 (H) 0.50 - 1.10 mg/dL Final  07/17/2013 12:20 AM 4.83 (H) 0.50 - 1.10 mg/dL Final  07/16/2013 03:22 PM 4.74 (H) 0.50 - 1.10 mg/dL Final  07/14/2013 02:15 PM 4.65 (H) 0.50 - 1.10 mg/dL Final  07/07/2013 02:02 PM 4.44 (H) 0.50 - 1.10 mg/dL Final  06/30/2013 03:15 PM 5.01 (H) 0.50 - 1.10 mg/dL Final  06/13/2013 02:20 PM 2.85 (H) 0.50 - 1.10 mg/dL Final   Recent Labs  Lab 04/03/19 1240 2019/04/18 0121 04/18/2019 0126 18-Apr-2019 0357 2019-04-18 0603 04-18-19 0617 Apr 18, 2019 0817  NA 136 133* 133* 133* 133* 133* 133*  K 4.2 5.2* 5.2* 5.6* 5.7* 5.7* 5.7*  CL 99 93*  --   --   --  91*  --   CO2 21* 20*  --   --   --  23  --   GLUCOSE 155* 236*  --   --   --  252*  --   BUN 75* 56*  --   --   --  57*  --   CREATININE 12.35* 10.68*  --   --   --  11.03*  --   CALCIUM 8.5* 8.7*  --   --   --  8.4*  --   PHOS  --   --   --   --   --  7.7*  --    Recent Labs  Lab 04/03/19 1240 04-18-2019 0121 04/18/19 0126 04/18/19 0357 Apr 18, 2019 0603 2019-04-18 0817  WBC 8.7 17.3*  --   --   --   --   NEUTROABS 6.8 16.1*  --   --   --   --   HGB 8.7* 9.3* 11.6* 10.9* 10.5* 10.9*  HCT 31.3* 33.1* 34.0* 32.0* 31.0* 32.0*  MCV 110.6* 109.6*  --   --   --   --   PLT 237 294  --   --   --   --    Liver Function Tests: Recent Labs  Lab 04/03/19 1240 2019/04/18 0121  AST 20 36  ALT 12 12  ALKPHOS 78 79  BILITOT 0.5 0.8  PROT 8.0 9.4*  ALBUMIN 2.3* 2.5*   Recent Labs  Lab 04/03/19 1240  LIPASE 82*  No results for input(s): AMMONIA in the last 168 hours. Cardiac Enzymes: No results  for input(s): CKTOTAL, CKMB, CKMBINDEX, TROPONINI in the last 168 hours. Iron Studies:  Recent Labs    04-15-19 0121  FERRITIN >7,500*   PT/INR: @LABRCNTIP (inr:5)  Xrays/Other Studies: ) Results for orders placed or performed during the hospital encounter of 2019/04/15 (from the past 48 hour(s))  Lactic acid, plasma     Status: Abnormal   Collection Time: 2019-04-15  1:20 AM  Result Value Ref Range   Lactic Acid, Venous 2.9 (HH) 0.5 - 1.9 mmol/L    Comment: CRITICAL RESULT CALLED TO, READ BACK BY AND VERIFIED WITH: [facility] 04/15/2019 0157 WAYK Performed at [facility], [place] [address] [place], [place] [address]   CBC WITH DIFFERENTIAL     Status: Abnormal   Collection Time: 04/15/2019  1:21 AM  Result Value Ref Range   WBC 17.3 (H) 4.0 - 10.5 K/uL   RBC 3.02 (L) 3.87 - 5.11 MIL/uL   Hemoglobin 9.3 (L) 12.0 - 15.0 g/dL   HCT 33.1 (L) 36.0 - 46.0 %   MCV 109.6 (H) 80.0 - 100.0 fL   MCH 30.8 26.0 - 34.0 pg   MCHC 28.1 (L) 30.0 - 36.0 g/dL   RDW 16.4 (H) 11.5 - 15.5 %   Platelets 294 150 - 400 K/uL   nRBC 0.0 0.0 - 0.2 %   Neutrophils Relative % 93 %   Neutro Abs 16.1 (H) 1.7 - 7.7 K/uL   Lymphocytes Relative 3 %   Lymphs Abs 0.5 (L) 0.7 - 4.0 K/uL   Monocytes Relative 3 %   Monocytes Absolute 0.6 0.1 - 1.0 K/uL   Eosinophils Relative 0 %   Eosinophils Absolute 0.0 0.0 - 0.5 K/uL   Basophils Relative 0 %   Basophils Absolute 0.0 0.0 - 0.1 K/uL   Immature Granulocytes 1 %   Abs Immature Granulocytes 0.16 (H) 0.00 - 0.07 K/uL    Comment: Performed at [facility], [address], [place], [place] [address]  Comprehensive metabolic panel     Status: Abnormal   Collection Time: Apr 15, 2019  1:21 AM  Result Value Ref Range   Sodium 133 (L) 135 - 145 mmol/L   Potassium 5.2 (H) 3.5 - 5.1 mmol/L   Chloride 93 (L) 98 - 111 mmol/L   CO2 20 (L) 22 - 32 mmol/L   Glucose, Bld 236 (H) 70 - 99 mg/dL   BUN 56 (H) 6 - 20 mg/dL   Creatinine, Ser 10.68 (H) 0.44 - 1.00 mg/dL    Calcium 8.7 (L) 8.9 - 10.3 mg/dL   Total Protein 9.4 (H) 6.5 - 8.1 g/dL   Albumin 2.5 (L) 3.5 - 5.0 g/dL   AST 36 15 - 41 U/L   ALT 12 0 - 44 U/L   Alkaline Phosphatase 79 38 - 126 U/L   Total Bilirubin 0.8 0.3 - 1.2 mg/dL   GFR calc non Af Amer 4 (L) >60 mL/min   GFR calc Af Amer 4 (L) >60 mL/min   Anion gap 20 (H) 5 - 15    Comment: Performed at [facility], [place] [address] [place], [place] [address]  D-dimer, quantitative     Status: Abnormal   Collection Time: Apr 15, 2019  1:21 AM  Result Value Ref Range   D-Dimer, Quant 2.54 (H) 0.00 - 0.50 ug/mL-FEU    Comment: (NOTE) At the manufacturer cut-off of 0.50 ug/mL FEU, this assay has been  documented to exclude PE with a sensitivity and negative predictive value of 97 to 99%.  At this time, this assay has not been approved by the FDA to exclude DVT/VTE. Results should be correlated with clinical presentation. Performed at [facility], [place] [address] [place], [place] [address]   Procalcitonin     Status: None   Collection Time: 04/08/2019  1:21 AM  Result Value Ref Range   Procalcitonin 17.94 ng/mL    Comment:        Interpretation: PCT >= 10 ng/mL: Important systemic inflammatory response, almost exclusively due to severe bacterial sepsis or septic shock. (NOTE)       Sepsis PCT Algorithm           Lower Respiratory Tract                                      Infection PCT Algorithm    ----------------------------     ----------------------------         PCT < 0.25 ng/mL                PCT < 0.10 ng/mL         Strongly encourage             Strongly discourage   discontinuation of antibiotics    initiation of antibiotics    ----------------------------     -----------------------------       PCT 0.25 - 0.50 ng/mL            PCT 0.10 - 0.25 ng/mL               OR       >80% decrease in PCT            Discourage initiation of                                            antibiotics      Encourage discontinuation            of antibiotics    ----------------------------     -----------------------------         PCT >= 0.50 ng/mL              PCT 0.26 - 0.50 ng/mL                AND       <80% decrease in PCT             Encourage initiation of                                             antibiotics       Encourage continuation           of antibiotics    ----------------------------     -----------------------------        PCT >= 0.50 ng/mL                  PCT > 0.50 ng/mL               AND         increase in PCT  Strongly encourage                                      initiation of antibiotics    Strongly encourage escalation           of antibiotics                                     -----------------------------                                           PCT <= 0.25 ng/mL                                                 OR                                        > 80% decrease in PCT                                     Discontinue / Do not initiate                                             antibiotics Performed at [facility], [place] [address] [place], [place] [address]   Lactate dehydrogenase     Status: Abnormal   Collection Time: 2019/05/05  1:21 AM  Result Value Ref Range   LDH 304 (H) 98 - 192 U/L    Comment: Performed at [facility], [place] [address] [place], [place] [address]  Ferritin     Status: Abnormal   Collection Time: 2019/05/05  1:21 AM  Result Value Ref Range   Ferritin >7,500 (H) 11 - 307 ng/mL    Comment: Performed at [facility], [place] [address] [place], [place] [address]  Triglycerides     Status: Abnormal   Collection Time: May 05, 2019  1:21 AM  Result Value Ref Range   Triglycerides 157 (H) <150 mg/dL    Comment: Performed at [facility] [address], [place], [place] [address]  Fibrinogen     Status: Abnormal   Collection Time: May 05, 2019  1:21 AM  Result Value Ref Range   Fibrinogen >800 (H) 210 - 475 mg/dL    Comment:  Performed at [facility] [address] [place], [place] [address]  C-reactive protein     Status: Abnormal   Collection Time: May 05, 2019  1:21 AM  Result Value Ref Range   CRP 23.4 (H) <1.0 mg/dL    Comment: Performed at [facility] [address] [place], [place] [address]  Brain natriuretic peptide     Status: Abnormal   Collection Time: May 05, 2019  1:21 AM  Result Value Ref Range   B Natriuretic Peptide 640.8 (H)  0.0 - 100.0 pg/mL    Comment: Performed at [facility], [place] [address] [place] [address], [place], [place] [address]  POCT I-Stat EG7     Status: Abnormal   Collection Time: Apr 19, 2019  1:26 AM  Result Value Ref Range   pH, Ven 7.287 7.250 - 7.430   pCO2, Ven 44.7 44.0 - 60.0 mmHg   pO2, Ven 27.0 (LL) 32.0 - 45.0 mmHg   Bicarbonate 21.4 20.0 - 28.0 mmol/L   TCO2 23 22 - 32 mmol/L   O2 Saturation 44.0 %   Acid-base deficit 5.0 (H) 0.0 - 2.0 mmol/L   Sodium 133 (L) 135 - 145 mmol/L   Potassium 5.2 (H) 3.5 - 5.1 mmol/L   Calcium, Ion 1.02 (L) 1.15 - 1.40 mmol/L   HCT 34.0 (L) 36.0 - 46.0 %   Hemoglobin 11.6 (L) 12.0 - 15.0 g/dL   Patient temperature 37.0 C    Sample type VENOUS    Comment NOTIFIED PHYSICIAN   I-STAT 7, (LYTES, BLD GAS, ICA, H+H)     Status: Abnormal   Collection Time: Apr 19, 2019  3:57 AM  Result Value Ref Range   pH, Arterial 7.184 (LL) 7.350 - 7.450   pCO2 arterial 65.9 (HH) 32.0 - 48.0 mmHg   pO2, Arterial 53.0 (L) 83.0 - 108.0 mmHg   Bicarbonate 24.8 20.0 - 28.0 mmol/L   TCO2 27 22 - 32 mmol/L   O2 Saturation 77.0 %   Acid-base deficit 4.0 (H) 0.0 - 2.0 mmol/L   Sodium 133 (L) 135 - 145 mmol/L   Potassium 5.6 (H) 3.5 - 5.1 mmol/L   Calcium, Ion 1.13 (L) 1.15 - 1.40 mmol/L   HCT 32.0 (L) 36.0 - 46.0 %   Hemoglobin 10.9 (L) 12.0 - 15.0 g/dL   Patient temperature 98.6 F    Collection site RADIAL, ALLEN'S TEST ACCEPTABLE    Drawn by Operator    Sample type ARTERIAL    Comment NOTIFIED PHYSICIAN   Glucose, capillary     Status: Abnormal    Collection Time: 2019-04-19  4:57 AM  Result Value Ref Range   Glucose-Capillary 225 (H) 70 - 99 mg/dL  MRSA PCR Screening     Status: None   Collection Time: 04-19-19  6:03 AM  Result Value Ref Range   MRSA by PCR NEGATIVE NEGATIVE    Comment:        The GeneXpert MRSA Assay (FDA approved for NASAL specimens only), is one component of a comprehensive MRSA colonization surveillance program. It is not intended to diagnose MRSA infection nor to guide or monitor treatment for MRSA infections. Performed at [facility], [place] [address] [place], [place] [address]   I-STAT 7, (LYTES, BLD GAS, ICA, H+H)     Status: Abnormal   Collection Time: 04/19/19  6:03 AM  Result Value Ref Range   pH, Arterial 7.193 (LL) 7.350 - 7.450   pCO2 arterial 65.4 (HH) 32.0 - 48.0 mmHg   pO2, Arterial 60.0 (L) 83.0 - 108.0 mmHg   Bicarbonate 24.4 20.0 - 28.0 mmol/L   TCO2 26 22 - 32 mmol/L   O2 Saturation 77.0 %   Acid-base deficit 4.0 (H) 0.0 - 2.0 mmol/L   Sodium 133 (L) 135 - 145 mmol/L   Potassium 5.7 (H) 3.5 - 5.1 mmol/L   Calcium, Ion 1.10 (L) 1.15 - 1.40 mmol/L   HCT 31.0 (L) 36.0 - 46.0 %   Hemoglobin 10.5 (L) 12.0 - 15.0 g/dL   Patient temperature 102.9 F  Collection site ARTERIAL LINE    Drawn by Operator    Sample type ARTERIAL    Comment NOTIFIED PHYSICIAN   Lactic acid, plasma     Status: None   Collection Time: 2019-04-23  6:13 AM  Result Value Ref Range   Lactic Acid, Venous 1.3 0.5 - 1.9 mmol/L    Comment: Performed at [facility], [place] [address] [place], [place] [address]  Basic metabolic panel     Status: Abnormal   Collection Time: 04/23/19  6:17 AM  Result Value Ref Range   Sodium 133 (L) 135 - 145 mmol/L   Potassium 5.7 (H) 3.5 - 5.1 mmol/L   Chloride 91 (L) 98 - 111 mmol/L   CO2 23 22 - 32 mmol/L   Glucose, Bld 252 (H) 70 - 99 mg/dL   BUN 57 (H) 6 - 20 mg/dL   Creatinine, Ser 11.03 (H) 0.44 - 1.00 mg/dL   Calcium 8.4 (L) 8.9 - 10.3 mg/dL   GFR calc non Af  Amer 3 (L) >60 mL/min   GFR calc Af Amer 4 (L) >60 mL/min   Anion gap 19 (H) 5 - 15    Comment: Performed at [facility] [address], [place], [place] [address]  Magnesium     Status: Abnormal   Collection Time: 04-23-19  6:17 AM  Result Value Ref Range   Magnesium 2.5 (H) 1.7 - 2.4 mg/dL    Comment: Performed at [facility] [address], [place], [place] [address]  Phosphorus     Status: Abnormal   Collection Time: 2019-04-23  6:17 AM  Result Value Ref Range   Phosphorus 7.7 (H) 2.5 - 4.6 mg/dL    Comment: Performed at [facility] [address] [place], [place] [address]  Vancomycin, random     Status: None   Collection Time: 2019-04-23  6:19 AM  Result Value Ref Range   Vancomycin Rm 10     Comment:        Random Vancomycin therapeutic range is dependent on dosage and time of specimen collection. A peak range is 20.0-40.0 ug/mL A trough range is 5.0-15.0 ug/mL        Performed at [facility] [address], [place], [place] [address]   Glucose, capillary     Status: Abnormal   Collection Time: 04-23-2019  7:25 AM  Result Value Ref Range   Glucose-Capillary 214 (H) 70 - 99 mg/dL  I-STAT 7, (LYTES, BLD GAS, ICA, H+H)     Status: Abnormal   Collection Time: 04-23-19  8:17 AM  Result Value Ref Range   pH, Arterial 7.205 (L) 7.350 - 7.450   pCO2 arterial 67.0 (HH) 32.0 - 48.0 mmHg   pO2, Arterial 55.0 (L) 83.0 - 108.0 mmHg   Bicarbonate 25.8 20.0 - 28.0 mmol/L   TCO2 28 22 - 32 mmol/L   O2 Saturation 74.0 %   Acid-base deficit 2.0 0.0 - 2.0 mmol/L   Sodium 133 (L) 135 - 145 mmol/L   Potassium 5.7 (H) 3.5 - 5.1 mmol/L   Calcium, Ion 1.12 (L) 1.15 - 1.40 mmol/L   HCT 32.0 (L) 36.0 - 46.0 %   Hemoglobin 10.9 (L) 12.0 - 15.0 g/dL   Patient temperature 102.5 F    Collection site RADIAL, ALLEN'S TEST ACCEPTABLE    Drawn by VP    Sample type ARTERIAL    Comment NOTIFIED PHYSICIAN    Dg Chest Port 1  View  Result Date: 04-10-19 CLINICAL  DATA:  58 y/o  F; post intubation. Covert positive. EXAM: PORTABLE CHEST 1 VIEW COMPARISON:  04-10-19 chest radiograph. FINDINGS: Stable cardiac silhouette given projection technique. Low lung volumes. Diffuse patchy opacities of the lungs are stable. Endotracheal tube tip 3.3 cm above the carina. Right central venous catheter tip projects over cavoatrial junction. Enteric tube tip extends below the field of view into the abdomen. No pleural effusion or pneumothorax. No acute osseous abnormality is evident. Vascular stent projects over the left axilla. IMPRESSION: 1. Endotracheal tube tip 3.3 cm above the carina. Enteric tube tip extends below the field of view into the abdomen. 2. Stable diffuse patchy opacities of the lungs and low lung volumes. Electronically Signed   By: Kristine Garbe M.D.   On: 04-10-19 03:54   Dg Chest Port 1 View  Result Date: 2019-04-10 CLINICAL DATA:  58 year old female with shortness of breath EXAM: PORTABLE CHEST 1 VIEW COMPARISON:  Chest radiograph dated 04/03/2019 FINDINGS: Right IJ dialysis catheter in similar position. There is cardiomegaly with vascular congestion, worsened since the prior exam. There is shallow inspiration with bibasilar atelectasis. Bilateral airspace densities may represent prominent vessels and congestion or pneumonia. Left subclavian vascular stent. No acute osseous pathology. IMPRESSION: 1. Cardiomegaly with worsening vascular congestion. 2. Shallow inspiration with bibasilar atelectasis. Pneumonia is not excluded. Electronically Signed   By: Anner Crete M.D.   On: 04/10/19 01:51    PMH:   Past Medical History:  Diagnosis Date  . Chronic diastolic CHF (congestive heart failure) ([facility])   . DCM (dilated cardiomyopathy) ([facility])    a. presumed nonischemic - no prior cath/nuc. EF 35-40% in 2011, most recently  60-65% by echo 2017.  Marland Kitchen Depression   . DM type 2 (diabetes mellitus, type 2) ([facility])    diet controlledwith retinopathy and  nephropathy  . ESRD on hemodialysis ([facility]) 08/21/2017  . GERD (gastroesophageal reflux disease)   . H/O cardiac arrest 10/2012  . Hard of hearing 10/2012   now wears 2 hearing aids  . History of hemodialysis dec 2013  . HTN (hypertension)   . Hydradenitis   . Hyperkalemia   . Hyperlipidemia   . Hypothyroidism   . Iron deficiency anemia   . Morbid obesity ([facility])   . Multinodular goiter   . Neuropathic pain 08/21/2017  . Orthostatic hypotension 06/27/2018  . PAF (paroxysmal atrial fibrillation) ([facility])   . Paroxysmal atrial flutter ([facility])   . Pneumonia Nov 10, 2012  . Sleep apnea    Intolerant to CPAP  . Tinnitus 12/26/2016  . Typical atrial flutter ([facility]) 04/10/2017  . Urinary tract infection    taking antibiotics for 3 days prior to surgery    PSH:   Past Surgical History:  Procedure Laterality Date  . AV FISTULA PLACEMENT Left 07/25/2013   Procedure: ARTERIOVENOUS (AV) FISTULA CREATION ;  Surgeon: Rosetta Posner, MD;  Location: [facility];  Service: Vascular;  Laterality: Left;  . AV FISTULA PLACEMENT Left 10/10/2018   Procedure: INSERTION OF LEFT UPPER ARM brachial to axillary artery ARTERIOVENOUS (AV) GORE-TEX GRAFT using 4-7 stretch graft;  Surgeon: Waynetta Sandy, MD;  Location: [facility];  Service: Vascular;  Laterality: Left;  . CYSTOSCOPY W/ RETROGRADES  11/16/2012   Procedure: CYSTOSCOPY WITH RETROGRADE PYELOGRAM;  Surgeon: Reece Packer, MD;  Location: [facility];  Service: Urology;  Laterality: Bilateral;  CYSTOSCOPY,BILATERAL RETROGRADE PYELOGRAM/ REMOVAL LEFT URETERAL STENT/ FULGERATION BLADDER MUCOSA/ INSERTION RIGHT URETERAL STENT  . CYSTOSCOPY  W/ URETERAL STENT PLACEMENT  05/28/2012   Procedure: CYSTOSCOPY WITH RETROGRADE PYELOGRAM/URETERAL STENT PLACEMENT;  Surgeon: Reece Packer, MD;  Location[facility];  Service: Urology;  Laterality: Left;  . CYSTOSCOPY WITH RETROGRADE PYELOGRAM, URETEROSCOPY AND STENT PLACEMENT Right 06/23/2013   Procedure: CYSTOSCOPY WITH RIGHT  URETEROSCOPY, RIGHT  RETROGRADE PYELOGRAM, WITH LASER LIPOTRIPSY AND RIGHT URETERAL STENT EXCHANGE ;  Surgeon: Molli Hazard, MD;  Location: [facility];  Service: Urology;  Laterality: Right;  STENT EXCHANGE    . CYSTOSCOPY WITH RETROGRADE PYELOGRAM, URETEROSCOPY AND STENT PLACEMENT Right 11/06/2013   Procedure: CYSTOSCOPY WITH RETROGRADE PYELOGRAM, URETEROSCOPY STONE REMOVAL AND STENT REMOVAL;  Surgeon: Molli Hazard, MD;  Location[facility];  Service: Urology;  Laterality: Right;  . HOLMIUM LASER APPLICATION N/A 7/49/4496   Procedure: HOLMIUM LASER APPLICATION;  Surgeon: Molli Hazard, MD;  Location: [facility];  Service: Urology;  Laterality: N/A;  . INSERTION OF DIALYSIS CATHETER N/A 09/24/2013   Procedure: INSERTION OF DIALYSIS CATHETER;  Surgeon: Rosetta Posner, MD;  [facility];  Service: Vascular;  Laterality: N/A;  . IR AV DIALY SHUNT INTRO NEEDLE/INTRAC INITIAL W/PTA/STENT/IMG LT Left 08/13/2018  . IR AV DIALY SHUNT INTRO NEEDLE/INTRACATH INITIAL W/PTA/IMG LEFT  04/19/2018  . IR AV DIALY SHUNT INTRO NEEDLE/INTRACATH INITIAL W/PTA/IMG LEFT  08/14/2018  . IR FLUORO GUIDE CV LINE RIGHT  09/17/2018  . IR FLUORO GUIDE CV LINE RIGHT  09/20/2018  . IR US GUIDE VASC ACCESS LEFT  04/19/2018  . IR US GUIDE VASC ACCESS LEFT  08/13/2018  . IR US GUIDE VASC ACCESS LEFT  08/14/2018  . IR US GUIDE VASC ACCESS RIGHT  09/17/2018  . PORT-A-CATH REMOVAL    . PORTACATH PLACEMENT    . REVISON OF ARTERIOVENOUS FISTULA Left 07/25/2016   Procedure: REVISON OF LEFT ARTERIOVENOUS FISTULA;  Surgeon: Elam Dutch, MD;  Location: [facility];  Service: Vascular;  Laterality: Left;  . THROMBECTOMY AND REVISION OF ARTERIOVENTOUS (AV) GORETEX  GRAFT Left 10/10/2018   Procedure: attempted of THROMBECTOMY OF ARTERIOVENTOUS FISTULA LEFT ARM;  Surgeon: Waynetta Sandy, MD;  Location: [place];  Service: Vascular;  Laterality: Left;    Allergies:  Allergies  Allergen Reactions  . Rosiglitazone Other  (See Comments)    "Avandia" = Reaction not recalled  . Amoxicillin Rash and Other (See Comments)    Tolerated Zosyn 01/2013. [employer] Has patient had a PCN reaction causing immediate rash, facial/tongue/throat swelling, SOB or lightheadedness with hypotension: Yes Has patient had a PCN reaction causing severe rash involving mucus membranes or skin necrosis: No Has patient had a PCN reaction that required hospitalization: No Has patient had a PCN reaction occurring within the last 10 years: No If all of the above answers are "NO", then may proceed with Cephalosporin use.   Marland Kitchen Amoxicillin-Pot Clavulanate Diarrhea    Medications:   Prior to Admission medications   Medication Sig Start Date End Date Taking? Authorizing Provider  acetaminophen (TYLENOL) 500 MG tablet Take 1,000 mg by mouth daily as needed for moderate pain.    [provider]  aspirin EC 81 MG tablet Take 81 mg by mouth at bedtime.     [provider]  B Complex-C-Folic Acid (NEPHRO-VITE PO) Take 1 tablet by mouth daily.    [provider]  Calcium Acetate 667 MG TABS Take 667 mg by mouth See admin instructions. Take 667 mg by mouth three times a day with meals and 667 mg with each snack  [provider]  Calcium Alginate-Silver (ANTIBACTERIAL ALGINATE W/SILV) 4.25"X4.25" PADS Apply 1 application topically daily.    [provider]  ceFEPIme 2 g in sodium chloride 0.9 % 100 mL Inject 2 g into the vein every Monday, Wednesday, and Friday with hemodialysis. 03/26/19   Black, Lezlie Octave, NP  Dakins (SODIUM HYPOCHLORITE) 0.25 % SOLN Apply 1 application topically See admin instructions. Apply to wounds as directed 2 times daily    [provider]  diclofenac sodium (VOLTAREN) 1 % GEL Apply 1 application topically as needed. 03/27/19   [provider]  diltiazem (CARDIZEM) 30 MG tablet Take 1 tablet every 4 hours AS NEEDED for rapid heart rate lasting more than 30 mins as long  as top blood pressure >100. Patient taking differently: Take 30 mg by mouth every 4 (four) hours as needed. Take 30 mg by mouth every four hours as needed for rapid heart rate lasting more than 30 minutes, as long as Systolic number ("top blood pressure") is >100 03/09/17   Sherran Needs, NP  diphenhydrAMINE (BENADRYL) 25 MG tablet Take 25 mg by mouth daily as needed for allergies.    [provider]  DULoxetine (CYMBALTA) 60 MG capsule Take 1 capsule (60 mg total) by mouth daily. 12/03/12   Janece Canterbury, MD  famotidine (PEPCID) 20 MG tablet TAKE 1 TABLET BY MOUTH AT BEDTIME Patient taking differently: Take 20 mg by mouth at bedtime.  10/15/14   Tanda Rockers, MD  feeding supplement, RESOURCE BREEZE, (RESOURCE BREEZE) LIQD Take 1 Container by mouth daily as needed (for meal supplementation).     [provider]  glipiZIDE (GLUCOTROL) 5 MG tablet Take 0.5 tablets (2.5 mg total) by mouth daily. 08/31/16   Debbe Odea, MD  levothyroxine (SYNTHROID, LEVOTHROID) 75 MCG tablet Take 75 mcg by mouth daily before breakfast.  08/17/15   [provider]  metroNIDAZOLE (FLAGYL) 500 MG tablet Take 1 tablet (500 mg total) by mouth 3 (three) times daily for 14 days. 03/25/19 04/08/19  Geradine Girt, DO  midodrine (PROAMATINE) 10 MG tablet Take 1 tablet (10 mg total) by mouth 3 (three) times daily. Patient taking differently: Take 10 mg by mouth See admin instructions. Take 10 mg by mouth in the morning on Mon/Wed/Fri ONLY twice a day- before dialysis 08/31/16   Debbe Odea, MD  Multiple Vitamin (MULTIVITAMIN WITH MINERALS) TABS tablet Take 1 tablet by mouth daily.    [provider]  oxyCODONE-acetaminophen (PERCOCET/ROXICET) 5-325 MG tablet Take 1-2 tablets by mouth every 6 (six) hours as needed. Patient has not taken since started dialysis 08/2013 due to causes hypotension Patient taking differently: Take 2 tablets by mouth every 6 (six) hours as needed for moderate pain.  Take in the afternoon after dialysis on dialysis days 07/25/16   Virgina Jock A, PA-C  pravastatin (PRAVACHOL) 40 MG tablet Take 40 mg by mouth every evening.  12/03/12   Janece Canterbury, MD  Probiotic Product (PROBIOTIC DAILY PO) Take 1 capsule by mouth 2 (two) times daily.     [provider]  sevelamer carbonate (RENVELA) 800 MG tablet Take 2,400 mg by mouth 3 (three) times daily with meals.    [provider]  sucroferric oxyhydroxide (VELPHORO) 500 MG chewable tablet Chew 500-1,000 mg by mouth See admin instructions. Chew 1,000 mg by mouth with each meal three times a day and 500 mg with each snack    [provider]    Discontinued Meds:   Medications  Discontinued During This Encounter  Medication Reason  . phenylephrine (NEOSYNEPHRINE) 10-0.9 MG/250ML-% infusion   . famotidine (PEPCID) IVPB 20 mg premix   . norepinephrine (LEVOPHED) 4mg  in 215mL premix infusion   . sodium bicarbonate NICU IV infusion 0.5 mEq/mL Entry Error    Social History:  reports that she quit smoking about 39 years ago. Her smoking use included cigarettes. She has a 0.25 pack-year smoking history. She has never used smokeless tobacco. She reports that she does not drink alcohol or use drugs.  Family History:   Family History  Problem Relation Age of Onset  . Coronary artery disease Mother   . Hypertension Mother   . Diabetes type II Mother   . Coronary artery disease Father   . Hypertension Father   . Malignant hyperthermia Father   . Cancer Maternal Grandfather        ? Type  . Kidney failure Maternal Grandmother     Blood pressure (!) 100/23, pulse (!) 109, temperature (!) 102.5 F (39.2 C), temperature source Oral, resp. rate (!) 32, height 5' 6.5" (1.689 m), weight (!) 168 kg, last menstrual period 06/13/2013, SpO2 (!) 86 %. To limit COVID exposure d/w staff in the room already and review of chart No edema LIJ TC  Intubated with P16  100% FIO2 NCAT SNDNT   Tachycardic Wound in rt inguinal region + rt buttock area       Dwana Melena, MD 2019-04-14, 8:35 AM

## 2019-04-28 NOTE — ED Provider Notes (Signed)
[facility] EMERGENCY DEPARTMENT Provider Note   CSN: [id number] Arrival date & time: 04/25/2019  0103    History   Chief Complaint Chief Complaint  Patient presents with   Shortness of Breath   Altered Mental Status   Level 5 caveat due to acuity of condition HPI [name] is a 58 y.o. female.     The history is provided by the patient.  Shortness of Breath  Severity:  Severe Onset quality:  Sudden Timing:  Constant Progression:  Worsening Chronicity:  New Worsened by:  Nothing Altered Mental Status  Patient with history of cardiomyopathy, diabetes, morbid obesity, ESRD on dialysis presents with cough, shortness of breath and fever.  Patient tested positive for COVID-19 on May 7 She left the hospital AMA.  Apparently she has been worsening.  It is reported she had dialysis on May 8.  No other details are known.  Past Medical History:  Diagnosis Date   Chronic diastolic CHF (congestive heart failure) (HCC)    DCM (dilated cardiomyopathy) ([name])    a. presumed nonischemic - no prior cath/nuc. EF 35-40% in 2011, most recently  60-65% by echo 2017.   Depression    DM type 2 (diabetes mellitus, type 2) ([place])    diet controlledwith retinopathy and nephropathy   ESRD on hemodialysis ([place]) 08/21/2017   GERD (gastroesophageal reflux disease)    H/O cardiac arrest 10/2012   Hard of hearing 10/2012   now wears 2 hearing aids   History of hemodialysis dec 2013   HTN (hypertension)    Hydradenitis    Hyperkalemia    Hyperlipidemia    Hypothyroidism    Iron deficiency anemia    Morbid obesity ([place])    Multinodular goiter    Neuropathic pain 08/21/2017   Orthostatic hypotension 06/27/2018   PAF (paroxysmal atrial fibrillation) (HCC)    Paroxysmal atrial flutter ([place])    Pneumonia Nov 10, 2012   Sleep apnea    Intolerant to CPAP   Tinnitus 12/26/2016   Typical atrial flutter ([place]) 04/10/2017   Urinary tract infection    taking antibiotics for 3 days prior to surgery    Patient Active Problem List   Diagnosis Date Noted   Pressure injury of skin 03/25/2019   Respiratory distress 03/25/2019   Elevated troponin 03/24/2019   Sepsis ([place]) 01/28/2019   Acute on chronic respiratory failure with hypoxia ([place]) 01/27/2019   Acute encephalopathy 01/27/2019   Anemia 09/17/2018   Orthostatic hypotension 06/27/2018   Neuropathic pain 08/21/2017   Typical atrial flutter ([place]) 04/10/2017   Persistent atrial fibrillation 02/04/2017   Tinnitus 12/26/2016   Streptococcal bacteremia    ESRD (end stage renal disease) on dialysis ([place]) 08/25/2016   Absolute anemia    Pressure ulcer 05/01/2015   Chronic respiratory failure ([place]) 11/10/2013   COPD (chronic obstructive pulmonary disease) ([place]) 11/06/2013   DCM (dilated cardiomyopathy) (HCC)    OSA (obstructive sleep apnea) 07/17/2013   Chronic diastolic heart failure ([place]) 07/16/2013   Dyspnea 07/11/2013   Hypertension 11/30/2012   Severe sepsis with acute organ dysfunction ([place]) 11/10/2012   Hydronephrosis of left kidney 05/28/2012   Nephrolithiasis 05/28/2012   Hidradenitis suppurativa 05/21/2012   Symptomatic anemia 05/21/2012   DM type 2 (diabetes mellitus, type 2) ([place])    Mood disorder in conditions classified elsewhere 09/06/2010   ERYTHEMA NODOSUM, HX OF 07/27/2010   GOITER, MULTINODULAR 05/18/2010   Morbid obesity ([place]) 05/18/2010   GERD 05/18/2010    Past Surgical  History:  Procedure Laterality Date   AV FISTULA PLACEMENT Left 07/25/2013   Procedure: ARTERIOVENOUS (AV) FISTULA CREATION ;  Surgeon: Rosetta Posner, MD;  Location: [facility];  Service: Vascular;  Laterality: Left;   AV FISTULA PLACEMENT Left 10/10/2018   Procedure: INSERTION OF LEFT UPPER ARM brachial to axillary artery ARTERIOVENOUS (AV) GORE-TEX GRAFT using 4-7 stretch graft;  Surgeon: Waynetta Sandy, MD;  Location: [facility];  Service: Vascular;   Laterality: Left;   CYSTOSCOPY W/ RETROGRADES  11/16/2012   Procedure: CYSTOSCOPY WITH RETROGRADE PYELOGRAM;  Surgeon: Reece Packer, MD;  Location: [facility];  Service: Urology;  Laterality: Bilateral;  CYSTOSCOPY,BILATERAL RETROGRADE PYELOGRAM/ REMOVAL LEFT URETERAL STENT/ FULGERATION BLADDER MUCOSA/ INSERTION RIGHT URETERAL STENT   CYSTOSCOPY W/ URETERAL STENT PLACEMENT  05/28/2012   Procedure: CYSTOSCOPY WITH RETROGRADE PYELOGRAM/URETERAL STENT PLACEMENT;  Surgeon: Reece Packer, MD;  [facility];  Service: Urology;  Laterality: Left;   CYSTOSCOPY WITH RETROGRADE PYELOGRAM, URETEROSCOPY AND STENT PLACEMENT Right 06/23/2013   Procedure: CYSTOSCOPY WITH RIGHT URETEROSCOPY, RIGHT  RETROGRADE PYELOGRAM, WITH LASER LIPOTRIPSY AND RIGHT URETERAL STENT EXCHANGE ;  Surgeon: Molli Hazard, MD;  Location[facility];  Service: Urology;  Laterality: Right;  STENT EXCHANGE     CYSTOSCOPY WITH RETROGRADE PYELOGRAM, URETEROSCOPY AND STENT PLACEMENT Right 11/06/2013   Procedure: CYSTOSCOPY WITH RETROGRADE PYELOGRAM, URETEROSCOPY STONE REMOVAL AND STENT REMOVAL;  Surgeon: Molli Hazard, MD;  [facility];  Service: Urology;  Laterality: Right;   HOLMIUM LASER APPLICATION N/A [id number]/80/9983   Procedure: HOLMIUM LASER APPLICATION;  Surgeon: Molli Hazard, MD;  [facility];  Service: Urology;  Laterality: N/A;   INSERTION OF DIALYSIS CATHETER N/A 09/24/2013   Procedure: INSERTION OF DIALYSIS CATHETER;  Surgeon: Rosetta Posner, MD;  Location: [facility];  Service: Vascular;  Laterality: N/A;   IR AV DIALY SHUNT INTRO NEEDLE/INTRAC INITIAL W/PTA/STENT/IMG LT Left 08/13/2018   IR AV DIALY SHUNT INTRO NEEDLE/INTRACATH INITIAL W/PTA/IMG LEFT  04/19/2018   IR AV DIALY SHUNT INTRO NEEDLE/INTRACATH INITIAL W/PTA/IMG LEFT  08/14/2018   IR FLUORO GUIDE CV LINE RIGHT  09/17/2018   IR FLUORO GUIDE CV LINE RIGHT  09/20/2018   IR US GUIDE VASC ACCESS LEFT  04/19/2018   IR US GUIDE VASC  ACCESS LEFT  08/13/2018   IR US GUIDE VASC ACCESS LEFT  08/14/2018   IR US GUIDE VASC ACCESS RIGHT  09/17/2018   PORT-A-CATH REMOVAL     PORTACATH PLACEMENT     REVISON OF ARTERIOVENOUS FISTULA Left 07/25/2016   Procedure: REVISON OF LEFT ARTERIOVENOUS FISTULA;  Surgeon: Elam Dutch, MD;  Location: [facility];  Service: Vascular;  Laterality: Left;   THROMBECTOMY AND REVISION OF ARTERIOVENTOUS (AV) GORETEX  GRAFT Left 10/10/2018   Procedure: attempted of THROMBECTOMY OF ARTERIOVENTOUS FISTULA LEFT ARM;  Surgeon: Waynetta Sandy, MD;  Location: [facility];  Service: Vascular;  Laterality: Left;     OB History   No obstetric history on file.      Home Medications    Prior to Admission medications   Medication Sig Start Date End Date Taking? Authorizing Provider  acetaminophen (TYLENOL) 500 MG tablet Take 1,000 mg by mouth daily as needed for moderate pain.    [provider]  aspirin EC 81 MG tablet Take 81 mg by mouth at bedtime.     [provider]  B Complex-C-Folic Acid (NEPHRO-VITE PO) Take 1 tablet by mouth daily.    [provider]  Calcium Acetate 667  MG TABS Take 667 mg by mouth See admin instructions. Take 667 mg by mouth three times a day with meals and 667 mg with each snack    [provider]  Calcium Alginate-Silver (ANTIBACTERIAL ALGINATE W/SILV) 4.25"X4.25" PADS Apply 1 application topically daily.    [provider]  ceFEPIme 2 g in sodium chloride 0.9 % 100 mL Inject 2 g into the vein every Monday, Wednesday, and Friday with hemodialysis. 03/26/19   Black, Lezlie Octave, NP  D[place]ins (SODIUM HYPOCHLORITE) 0.25 % SOLN Apply 1 application topically See admin instructions. Apply to wounds as directed 2 times daily    [provider]  diclofenac sodium (VOLTAREN) 1 % GEL Apply 1 application topically as needed. 03/27/19   [provider]  diltiazem (CARDIZEM) 30 MG tablet Take 1 tablet every 4 hours AS NEEDED for  rapid heart rate lasting more than 30 mins as long as top blood pressure >100. Patient taking differently: Take 30 mg by mouth every 4 (four) hours as needed. Take 30 mg by mouth every four hours as needed for rapid heart rate lasting more than 30 minutes, as long as Systolic number ("top blood pressure") is >100 03/09/17   Sherran Needs, NP  diphenhydrAMINE (BENADRYL) 25 MG tablet Take 25 mg by mouth daily as needed for allergies.    [provider]  DULoxetine (CYMBALTA) 60 MG capsule Take 1 capsule (60 mg total) by mouth daily. 12/03/12   Janece Canterbury, MD  famotidine (PEPCID) 20 MG tablet TAKE 1 TABLET BY MOUTH AT BEDTIME Patient taking differently: Take 20 mg by mouth at bedtime.  10/15/14   Tanda Rockers, MD  feeding supplement, RESOURCE BREEZE, (RESOURCE BREEZE) LIQD Take 1 Container by mouth daily as needed (for meal supplementation).     [provider]  glipiZIDE (GLUCOTROL) 5 MG tablet Take 0.5 tablets (2.5 mg total) by mouth daily. 08/31/16   Debbe Odea, MD  levothyroxine (SYNTHROID, LEVOTHROID) 75 MCG tablet Take 75 mcg by mouth daily before breakfast.  08/17/15   [provider]  metroNIDAZOLE (FLAGYL) 500 MG tablet Take 1 tablet (500 mg total) by mouth 3 (three) times daily for 14 days. 03/25/19 04/08/19  Geradine Girt, DO  midodrine (PROAMATINE) 10 MG tablet Take 1 tablet (10 mg total) by mouth 3 (three) times daily. Patient taking differently: Take 10 mg by mouth See admin instructions. Take 10 mg by mouth in the morning on Mon/Wed/Fri ONLY twice a day- before dialysis 08/31/16   Debbe Odea, MD  Multiple Vitamin (MULTIVITAMIN WITH MINERALS) TABS tablet Take 1 tablet by mouth daily.    [provider]  oxyCODONE-acetaminophen (PERCOCET/ROXICET) 5-325 MG tablet Take 1-2 tablets by mouth every 6 (six) hours as needed. Patient has not taken since started dialysis 08/2013 due to causes hypotension Patient taking differently: Take 2 tablets by  mouth every 6 (six) hours as needed for moderate pain. Take in the afternoon after dialysis on dialysis days 07/25/16   Virgina Jock A, PA-C  pravastatin (PRAVACHOL) 40 MG tablet Take 40 mg by mouth every evening.  12/03/12   Janece Canterbury, MD  Probiotic Product (PROBIOTIC DAILY PO) Take 1 capsule by mouth 2 (two) times daily.     [provider]  sevelamer carbonate (RENVELA) 800 MG tablet Take 2,400 mg by mouth 3 (three) times daily with meals.    [provider]  sucroferric oxyhydroxide (VELPHORO) 500 MG chewable tablet Chew 500-1,000 mg by mouth See admin instructions. Chew 1,000  mg by mouth with each meal three times a day and 500 mg with each snack    [provider]    Family History Family History  Problem Relation Age of Onset   Coronary artery disease Mother    Hypertension Mother    Diabetes type II Mother    Coronary artery disease Father    Hypertension Father    Malignant hyperthermia Father    Cancer Maternal Grandfather        ? Type   Kidney failure Maternal Grandmother     Social History Social History   Tobacco Use   Smoking status: Former Smoker    Packs/day: 0.25    Years: 1.00    Pack years: 0.25    Types: Cigarettes    Last attempt to quit: 11/28/1979    Years since quitting: 39.3   Smokeless tobacco: Never Used  Substance Use Topics   Alcohol use: No   Drug use: No     Allergies   Rosiglitazone; Amoxicillin; and Amoxicillin-pot clavulanate   Review of Systems Review of Systems  Unable to perform ROS: Acuity of condition  Respiratory: Positive for shortness of breath.      Physical Exam Updated Vital Signs BP 100/60 (BP Location: Right Wrist)    Pulse (!) 114    Temp (!) 103.1 F (39.5 C) (Oral)    Resp (!) 21    Ht 1.689 m (5' 6.5")    Wt (!) 168 kg    LMP 06/13/2013    SpO2 (!) 88%    BMI 58.88 kg/m   Physical Exam CONSTITUTIONAL: Ill-appearing HEAD: Normocephalic/atraumatic EYES: EOMI ENMT:  Nonrebreather mask in place NECK: supple no meningeal signs SPINE/BACK:entire spine nontender CV: Tachycardic LUNGS: Tachypneic and mild distress noted ABDOMEN: soft, nontender, obesity noted, no crepitus GU:no cva tenderness NEURO: Pt is awake/alert, moves all extremitiesx4.  Patient appears mildly confused EXTREMITIES: pulses normal/equal, full ROM SKIN: warm, color normal, wound noted to right inguinal region without crepitus or significant drainage.  Wound noted to right buttock, the patient did not cooperate with rest of exam, nurse present for exam Dialysis catheter noted to upper chest PSYCH: anxious  ED Treatments / Results  Labs (all labs ordered are listed, but only abnormal results are displayed) Labs Reviewed  LACTIC ACID, PLASMA - Abnormal; Notable for the following components:      Result Value   Lactic Acid, Venous 2.9 (*)    All other components within normal limits  CBC WITH DIFFERENTIAL/PLATELET - Abnormal; Notable for the following components:   WBC 17.3 (*)    RBC 3.02 (*)    Hemoglobin 9.3 (*)    HCT 33.1 (*)    MCV 109.6 (*)    MCHC 28.1 (*)    RDW 16.4 (*)    Neutro Abs 16.1 (*)    Lymphs Abs 0.5 (*)    Abs Immature Granulocytes 0.16 (*)    All other components within normal limits  COMPREHENSIVE METABOLIC PANEL - Abnormal; Notable for the following components:   Sodium 133 (*)    Potassium 5.2 (*)    Chloride 93 (*)    CO2 20 (*)    Glucose, Bld 236 (*)    BUN 56 (*)    Creatinine, Ser 10.68 (*)    Calcium 8.7 (*)    Total Protein 9.4 (*)    Albumin 2.5 (*)    GFR calc non Af Amer 4 (*)    GFR calc Af Amer 4 (*)  Anion gap 20 (*)    All other components within normal limits  D-DIMER, QUANTITATIVE (NOT AT [facility]) - Abnormal; Notable for the following components:   D-Dimer, Quant 2.54 (*)    All other components within normal limits  LACTATE DEHYDROGENASE - Abnormal; Notable for the following components:   LDH 304 (*)    All other components  within normal limits  TRIGLYCERIDES - Abnormal; Notable for the following components:   Triglycerides 157 (*)    All other components within normal limits  FIBRINOGEN - Abnormal; Notable for the following components:   Fibrinogen >800 (*)    All other components within normal limits  C-REACTIVE PROTEIN - Abnormal; Notable for the following components:   CRP 23.4 (*)    All other components within normal limits  BRAIN NATRIURETIC PEPTIDE - Abnormal; Notable for the following components:   B Natriuretic Peptide 640.8 (*)    All other components within normal limits  POCT I-STAT EG7 - Abnormal; Notable for the following components:   pO2, Ven 27.0 (*)    Acid-base deficit 5.0 (*)    Sodium 133 (*)    Potassium 5.2 (*)    Calcium, Ion 1.02 (*)    HCT 34.0 (*)    Hemoglobin 11.6 (*)    All other components within normal limits  CULTURE, BLOOD (ROUTINE X 2)  CULTURE, BLOOD (ROUTINE X 2)  CULTURE, RESPIRATORY  PROCALCITONIN  LACTIC ACID, PLASMA  FERRITIN  CBC  BLOOD GAS, ARTERIAL  BASIC METABOLIC PANEL  MAGNESIUM  PHOSPHORUS  BLOOD GAS, ARTERIAL    EKG EKG Interpretation  Date/Time:  04-25-19 01:05:29 EDT Ventricular Rate:  111 PR Interval:    QRS Duration: 99 QT Interval:  296 QTC Calculation: 403 R Axis:   65 Text Interpretation:  Sinus tachycardia Borderline low voltage, extremity leads Abnormal T, consider ischemia, lateral leads   Confirmed by Ripley Fraise ([id number]) on 04/25/2019 1:37:41 AM   Radiology Dg Chest Port 1 View  Result Date: 2019-04-25 CLINICAL DATA:  58 year old female with shortness of breath EXAM: PORTABLE CHEST 1 VIEW COMPARISON:  Chest radiograph dated 04/03/2019 FINDINGS: Right IJ dialysis catheter in similar position. There is cardiomegaly with vascular congestion, worsened since the prior exam. There is shallow inspiration with bibasilar atelectasis. Bilateral airspace densities may represent prominent vessels and congestion or  pneumonia. Left subclavian vascular stent. No acute osseous pathology. IMPRESSION: 1. Cardiomegaly with worsening vascular congestion. 2. Shallow inspiration with bibasilar atelectasis. Pneumonia is not excluded. Electronically Signed   By: Anner Crete M.D.   On: Apr 25, 2019 01:51    Procedures Procedure Name: Intubation Date/Time: 25-Apr-2019 3:11 AM Performed by: Ripley Fraise, MD Pre-anesthesia Checklist: Patient identified Oxygen Delivery Method: Non-rebreather mask Preoxygenation: Pre-oxygenation with 100% oxygen Induction Type: Rapid sequence Laryngoscope Size: Glidescope Grade View: Grade I Tube size: 7.5 mm Number of attempts: 1 Placement Confirmation: ETT inserted through vocal cords under direct vision and CO2 detector Secured at: 24 cm Tube secured with: ETT holder       CRITICAL CARE Performed by: Sharyon Cable Total critical care time: 50 minutes Critical care time was exclusive of separately billable procedures and treating other patients. Critical care was necessary to treat or prevent imminent or life-threatening deterioration. Critical care was time spent personally by me on the following activities: development of treatment plan with patient and/or surrogate as well as nursing, discussions with consultants, evaluation of patient's response to treatment, examination of patient, obtaining history from patient or surrogate,  ordering and performing treatments and interventions, ordering and review of laboratory studies, ordering and review of radiographic studies, pulse oximetry and re-evaluation of patient's condition.   Medications Ordered in ED Medications  propofol (DIPRIVAN) 1000 MG/100ML infusion (has no administration in time range)  0.9 %  sodium chloride infusion (has no administration in time range)  midodrine (PROAMATINE) tablet 10 mg (has no administration in time range)  pravastatin (PRAVACHOL) tablet 40 mg (has no administration in time range)    levothyroxine (SYNTHROID) tablet 75 mcg (has no administration in time range)  Calcium Acetate TABS 667 mg (has no administration in time range)  sevelamer carbonate (RENVELA) tablet 2,400 mg (has no administration in time range)  heparin injection 5,000 Units (has no administration in time range)  acetaminophen (TYLENOL) tablet 650 mg (has no administration in time range)  ondansetron (ZOFRAN) injection 4 mg (has no administration in time range)  azithromycin (ZITHROMAX) 500 mg in sodium chloride 0.9 % 250 mL IVPB (500 mg Intravenous New Bag/Given May 01, 2019 0348)  hydroxychloroquine (PLAQUENIL) tablet 400 mg (has no administration in time range)    Followed by  hydroxychloroquine (PLAQUENIL) tablet 200 mg (has no administration in time range)  insulin aspart (novoLOG) injection 0-20 Units (has no administration in time range)  pantoprazole (PROTONIX) injection 40 mg (has no administration in time range)  albuterol (PROVENTIL) (2.5 MG/3ML) 0.083% nebulizer solution 2.5 mg (has no administration in time range)  chlorhexidine gluconate (MEDLINE KIT) (PERIDEX) 0.12 % solution 15 mL (has no administration in time range)  MEDLINE mouth rinse (has no administration in time range)  fentaNYL (SUBLIMAZE) injection 50 mcg (50 mcg Intravenous Given May 01, 2019 0339)  fentaNYL (SUBLIMAZE) injection 50-200 mcg (has no administration in time range)  midazolam (VERSED) injection 2 mg (2 mg Intravenous Given 05-01-2019 0338)  midazolam (VERSED) injection 2 mg (has no administration in time range)  docusate (COLACE) 50 MG/5ML liquid 100 mg (has no administration in time range)  bisacodyl (DULCOLAX) suppository 10 mg (has no administration in time range)  midazolam (VERSED) 2 MG/2ML injection (has no administration in time range)  fentaNYL (SUBLIMAZE) 100 MCG/2ML injection (has no administration in time range)  midazolam (VERSED) 2 MG/2ML injection (has no administration in time range)  etomidate (AMIDATE) injection  (20 mg Intravenous Given 01-May-2019 0257)  rocuronium (ZEMURON) injection (100 mg Intravenous Given 05/01/2019 0251)  succinylcholine (ANECTINE) injection (150 mg Intravenous Given 05/01/19 0258)  fentaNYL (SUBLIMAZE) injection 100 mcg (100 mcg Intravenous Given 01-May-2019 0315)  midazolam (VERSED) injection 4 mg (4 mg Intravenous Given 2019/05/01 0315)     Initial Impression / Assessment and Plan / ED Course  I have reviewed the triage vital signs and the nursing notes.  Pertinent labs & imaging results that were available during my care of the patient were reviewed by me and considered in my medical decision making (see chart for details).        1:29 AM Patient seen on arrival to room for suspected COVID.  Patient is very ill-appearing, she is tachycardic, febrile, hypoxic with 15 L nonrebreather Suspect this represents worsening COVID-19 Imaging/labs have been ordered. Patient appears to have wounds throughout her inguinal region as well as her buttocks.  The ones that she allows me to evaluate do not appear significantly worse.  No crepitus. 1:32 AM D/w patient's husband Kieran Nachtigal He reports patient had improved until about 2 hours ago when she became abruptly short of breath.  She kept asking to turn up her oxygen and she appeared  confused. He was informed the patient is critically ill with you be admitted to hospital. She had dialysis on 04/04/2019  3:53 AM I had a discussion with critical care about this patient, as I felt that she would likely require intubation.  We kept her on a nonrebreather and her pulse ox rarely got above 90%.  She was tachypneic and was in respiratory distress. She was intermittently agitated, but was also appearing to become somnolent. Decision was to intubate patient.  This was discussed with husband prior to procedure, and he agreed with plan.  Patient was intubated without difficulty. I discussed the case again with her husband Legrand Como, I described to him the  critical nature of his wife. Patient to be admitted to the critical care service Patient likely has combination of acute pulmonary edema as well as COVID-19 pneumonia  Miliana B Rideout was evaluated in Emergency Department on 04/21/19 for the symptoms described in the history of present illness. She was evaluated in the context of the global COVID-19 pandemic, which necessitated consideration that the patient might be at risk for infection with the SARS-CoV-2 virus that causes COVID-19. Institutional protocols and algorithms that pertain to the evaluation of patients at risk for COVID-19 are in a state of rapid change based on information released by regulatory bodies including the CDC and federal and state organizations. These policies and algorithms were followed during the patient's care in the ED.    Final Clinical Impressions(s) / ED Diagnoses   Final diagnoses:  Pneumonia due to COVID-19 virus  ESRD (end stage renal disease) ([place])  Acute respiratory failure with hypoxia [facility])    ED Discharge Orders    None       Ripley Fraise, MD 2019-04-21 [phone]

## 2019-04-28 NOTE — Progress Notes (Signed)
Pharmacy Antibiotic Note  [name] is a 58 y.o. female, COVID+ on 5/7, admitted on 04/12/19 with SOB.  Pharmacy has been consulted for Vancomycin and Cefepime dosing. Also started on azithromycin per MD. Pt has been receiving Vancomycin/Cefepime/Flagyl PTA, last HD 5/8.  Vancomycin level this morning was low at 10 and vancomycin was re-loaded at 1500mg  IV x 1 and given cefepime 2g IV x 1.  Patient now to transition to CRRT - will adjust antibiotic dosing.  Plan: Continue vancomycin 1750mg  IV q24h to start tomorrow Continue cefepime 2g IV q12h Monitor clinical progress, c/s, abx plan/LOT Vancomycin levels as indicated F/u CRRT tolerance and Renal plans   Height: 5' 6.5" (168.9 cm) Weight: (!) 370 lb 6 oz (168 kg) IBW/kg (Calculated) : 60.45  Temp (24hrs), Avg:102.8 F (39.3 C), Min:102.5 F (39.2 C), Max:103.1 F (39.5 C)  Recent Labs  Lab 04/03/19 1240 Apr 12, 2019 0120 04/12/2019 0121 04/12/19 0613 04/12/19 0617 2019-04-12 0619  WBC 8.7  --  17.3*  --   --   --   CREATININE 12.35*  --  10.68*  --  11.03*  --   LATICACIDVEN 0.9 2.9*  --  1.3  --   --   VANCORANDOM  --   --   --   --   --  10    Estimated Creatinine Clearance: 9.2 mL/min (A) (by C-G formula based on SCr of 11.03 mg/dL (H)).    Allergies  Allergen Reactions  . Rosiglitazone Other (See Comments)    "Avandia" = Reaction not recalled  . Amoxicillin Rash and Other (See Comments)    Tolerated Zosyn 01/2013. [place] Has patient had a PCN reaction causing immediate rash, facial/tongue/throat swelling, SOB or lightheadedness with hypotension: Yes Has patient had a PCN reaction causing severe rash involving mucus membranes or skin necrosis: No Has patient had a PCN reaction that required hospitalization: No Has patient had a PCN reaction occurring within the last 10 years: No If all of the above answers are "NO", then may proceed with Cephalosporin use.   Marland Kitchen Amoxicillin-Pot Clavulanate Diarrhea    Elicia Lamp, PharmD, BCPS Please check AMION for all [place] contact numbers Clinical Pharmacist Apr 12, 2019 9:07 AM

## 2019-04-28 NOTE — Progress Notes (Signed)
Spoke with Saralyn Pilar, RN concerning PICC. This Chief Strategy Officer instructed Saralyn Pilar, RN that per the chart, the patient has had multiple IR placements for central lines (six times total including both right and left side from 03/2018-08/2018), multiple AV shunt placements to left arm, and a right breast biopsy. Saralyn Pilar, RN stated that he is attempting to get a central line placed. He instructed this author to discontinue PICC order. I informed him that if needed, to replace the order and we will precede from there.

## 2019-04-28 NOTE — Progress Notes (Signed)
Patient has limited iv access.  Spoke with CCM and Nephrology and they gave the OK to infuse medication in patient's HD cath until central access is established

## 2019-04-28 NOTE — Procedures (Signed)
Arterial Catheter Insertion Procedure Note [name] [id number] Oct 27, 1961  Procedure: Insertion of Arterial Catheter  Indications: Blood pressure monitoring and Frequent blood sampling  Procedure Details Consent: Unable to obtain consent because of altered level of consciousness. Time Out: Verified patient identification, verified procedure, site/side was marked, verified correct patient position, special equipment/implants available, medications/allergies/relevent history reviewed, required imaging and test results available.  Performed  Maximum sterile technique was used including antiseptics, cap, gloves, gown, hand hygiene, mask and sheet. Skin prep: Chlorhexidine; 20 gauge catheter was inserted into right radial artery using the Seldinger technique. ULTRASOUND GUIDANCE USED: NO Evaluation Blood flow good; BP tracing good. Complications: No apparent complications.  Aline placed per MD order with initial BP of 142/58. No complications.   Irineo Axon [facility] Apr 25, 2019

## 2019-04-28 NOTE — H&P (Signed)
NAME:  [name], MRN:  [id number], DOB:  1961-05-20, LOS: 0 ADMISSION DATE:  04-14-19, CONSULTATION DATE:  2019/04/14 REFERRING MD:  Christy Gentles - EM, CHIEF COMPLAINT:  SOB  Brief History    58 yo F PMH HTN, HLD, DM2, ESRD on m/w/f iHD, OSA, Afib, diastolic HF, COVID+ on 5/7 who left ED AMA, presents 5/10 with worsening SOB. Patient's husband progressively has been increasing her home O2 (baseline 2L) before calling EMS for transport to ED at [facility]. In ED, patient on 15L NRB and SpO2 88%. CXR reveals bilateral opacities, worse from prior on 5/7. Patient is febrile T 103, WBC 17.3, PCT 17.94, CRP 23.4, Lactic acid 2.9, BNP 640, LDH 304, D-Dimer 2.54. Patient will be intubated in ED and admitted to ICU.   Past Medical History  ESRD CHF A Fib HTn DM2 OSA COVID 19 GERD Cardiac arrest Morbid obesity Hypothyroidism   Significant Hospital Events   4/27 - covid negative - admitted for hidradenitis 5/7> COVID 19 positive, left ED AMA -> atttend HD at home, exposed husband 5/10 >Presents with worsening SOB, intubated, admit to ICU   Consults:  Neprhrology   Procedures:  5/10> ETT>>>  Significant Diagnostic Tests:  5/10 CXR> diffuse bilateral opacities   Micro Data:  5/7 COVID 19 Positive 5/10 BCx >>>   Antimicrobials:  5/10 vanc>> 5/10 Cefepime >> 5/10 Azithromycin >> 5/10 5/10 Plaquenil >> 5/10  Interim history/subjective:   2019/04/14- very hypoxemic and acidotic. Maintains BP/HR. Renal planning CRRT. Just started vec prn with orders for nimbex, dialudid gtt, versed gtt and nimbex gtt. Husband says exposed but asymptomatic. Husband says patient ill with fever > 11 days prior to admit. Says was on abx for hidradenitis which helped it but there was persistent fever that only resolved last 1-2 days and this correlated with worsening dyspnea and ER admission. Husband says "do everything". Did not appreciate recommendations for DNR - he went quiet on this recommendation  after discussing plasma and tocilizumab which he agreed to for research/off-label use  Renal ok with placing picc line   Objective   Blood pressure (!) 110/42, pulse (!) 108, temperature (!) 102.5 F (39.2 C), temperature source Oral, resp. rate (!) 36, height 5' 6.5" (1.689 m), weight (!) 168 kg, last menstrual period 06/13/2013, SpO2 (!) 78 %.    Vent Mode: PRVC FiO2 (%):  [100 %] 100 % Set Rate:  [30 bmp-35 bmp] 35 bmp Vt Set:  [350 mL] 350 mL PEEP:  [12 cmH20-16 cmH20] 16 cmH20 Plateau Pressure:  [22 cmH20-27 cmH20] 22 cmH20   Intake/Output Summary (Last 24 hours) at Apr 14, 2019 8242 Last data filed at 14-Apr-2019 0900 Gross per 24 hour  Intake 439.56 ml  Output --  Net 439.56 ml   Filed Weights   Apr 14, 2019 0119  Weight: (!) 168 kg    Due to Pandemic situation - this exam is an inspection exam from outside or via camera and with tactile part being shared findings with other care providers who have examined in last 60 minutes   General Appearance:  Looks criticall ill HEENT: muddy Throat:  ETT TUBE - yes , OG tube - yes Neck:  Supple,  No enlargement/tenderness/nodules Lungs: Clear to auscultation bilaterally, Ventilator   Synchrony -yes with sedatioin and paralyticu Heart:  S1 and S2 normal, no murmur, CVP - x  Pressors - no Abdomen:  Not examined Genitalia / Rectal:  Not examined Extremities:  Extremities- intct Skin:  Intact in exposed areas . Sacral  area - not examined Neurologic:  Sedation - dialudid, versed and vec prn -> RASS - -4  LABS    PULMONARY Recent Labs  Lab 2019/04/09 0126 04-09-19 0357 2019/04/09 0603 04-09-2019 0817  PHART  --  7.184* 7.193* 7.205*  PCO2ART  --  65.9* 65.4* 67.0*  PO2ART  --  53.0* 60.0* 55.0*  HCO3 21.4 24.8 24.4 25.8  TCO2 23 27 26 28   O2SAT 44.0 77.0 77.0 74.0    CBC Recent Labs  Lab 04/03/19 1240 2019-04-09 0121  09-Apr-2019 0357 04-09-2019 0603 04-09-2019 0817  HGB 8.7* 9.3*   < > 10.9* 10.5* 10.9*  HCT 31.3* 33.1*   < >  32.0* 31.0* 32.0*  WBC 8.7 17.3*  --   --   --   --   PLT 237 294  --   --   --   --    < > = values in this interval not displayed.    COAGULATION No results for input(s): INR in the last 168 hours.  CARDIAC  No results for input(s): TROPONINI in the last 168 hours. No results for input(s): PROBNP in the last 168 hours.   CHEMISTRY Recent Labs  Lab 04/03/19 1240 Apr 09, 2019 0121 04-09-19 0126 Apr 09, 2019 0357 2019/04/09 0603 04-09-2019 0617 2019/04/09 0817  NA 136 133* 133* 133* 133* 133* 133*  K 4.2 5.2* 5.2* 5.6* 5.7* 5.7* 5.7*  CL 99 93*  --   --   --  91*  --   CO2 21* 20*  --   --   --  23  --   GLUCOSE 155* 236*  --   --   --  252*  --   BUN 75* 56*  --   --   --  57*  --   CREATININE 12.35* 10.68*  --   --   --  11.03*  --   CALCIUM 8.5* 8.7*  --   --   --  8.4*  --   MG  --   --   --   --   --  2.5*  --   PHOS  --   --   --   --   --  7.7*  --    Estimated Creatinine Clearance: 9.2 mL/min (A) (by C-G formula based on SCr of 11.03 mg/dL (H)).   LIVER Recent Labs  Lab 04/03/19 1240 04/09/2019 0121  AST 20 36  ALT 12 12  ALKPHOS 78 79  BILITOT 0.5 0.8  PROT 8.0 9.4*  ALBUMIN 2.3* 2.5*     INFECTIOUS Recent Labs  Lab 04/03/19 1240 04-09-19 0120 04/09/19 0121 04/09/2019 0613  LATICACIDVEN 0.9 2.9*  --  1.3  PROCALCITON  --   --  17.94  --      ENDOCRINE CBG (last 3)  Recent Labs    09-Apr-2019 0457 2019-04-09 0725  GLUCAP 225* 214*         IMAGING x48h  - image(s) personally visualized  -   highlighted in bold Dg Chest Port 1 View  Result Date: 04/09/19 CLINICAL DATA:  58 y/o  F; post intubation. Covert positive. EXAM: PORTABLE CHEST 1 VIEW COMPARISON:  09-Apr-2019 chest radiograph. FINDINGS: Stable cardiac silhouette given projection technique. Low lung volumes. Diffuse patchy opacities of the lungs are stable. Endotracheal tube tip 3.3 cm above the carina. Right central venous catheter tip projects over cavoatrial junction. Enteric tube tip extends  below the field of view into the abdomen. No pleural effusion or pneumothorax. No  acute osseous abnormality is evident. Vascular stent projects over the left axilla. IMPRESSION: 1. Endotracheal tube tip 3.3 cm above the carina. Enteric tube tip extends below the field of view into the abdomen. 2. Stable diffuse patchy opacities of the lungs and low lung volumes. Electronically Signed   By: Kristine Garbe M.D.   On: 11-Apr-2019 03:54   Dg Chest Port 1 View  Result Date: 04-11-19 CLINICAL DATA:  58 year old female with shortness of breath EXAM: PORTABLE CHEST 1 VIEW COMPARISON:  Chest radiograph dated 04/03/2019 FINDINGS: Right IJ dialysis catheter in similar position. There is cardiomegaly with vascular congestion, worsened since the prior exam. There is shallow inspiration with bibasilar atelectasis. Bilateral airspace densities may represent prominent vessels and congestion or pneumonia. Left subclavian vascular stent. No acute osseous pathology. IMPRESSION: 1. Cardiomegaly with worsening vascular congestion. 2. Shallow inspiration with bibasilar atelectasis. Pneumonia is not excluded. Electronically Signed   By: Anner Crete M.D.   On: 04/11/2019 01:51   [place] Ekg Site Rite  Result Date: 2019-04-11 If Site Rite image not attached, placement could not be confirmed due to current cardiac rhythm.  [place] Ekg Site Rite  Result Date: Apr 11, 2019 If Site Rite image not attached, placement could not be confirmed due to current cardiac rhythm.    Resolved Hospital Problem list     Assessment & Plan:   ASSESSMENT / PLAN:  PULMONARY A:  Chronic hypoxemic resp failure - 2L [place] at baseline , Known OSA  Acute hypoxemic resp failure with ARDS due to COVID-19. Possible but unlikely HAP  04/11/19 -> 100% fio2, peep > 10, very acidotic  P:   Deeply sedated with opioid and benzo gtt ARDS protocol - start at 8cc/kg/IBW Nimbex x 48h Consider prone ventilation depedning on course Bic  gtt Tocilizumab - dose#1 for cytokine storm (off lable use and uncertaintities, risks and potential benefits and emerging data discussed with husband - he wants to go ahead. She has a high PCT but is     NEUROLOGIC A:   Deep sedation required. Oriented pre intubation P:   Opioid gt Benzo gtt RASS -4 BIS monitoring with nimbex    VASCULAR A:   Maintain BP/HR  P:  MAP goal > 65  CARDIAC A: Known Chronic d-CHF At risk for viral cardiomyopathyf rom covid-19  P: Track trop and depending on results get eech   INFECTIOUS A:   Hidradenitis admission end April 2020 and opd abx +  COVID + 04/03/2019 was negative 03/24/2019 at time of admission for Hidradenitis but husband reports fever + for > 11 days despite outpatient antibiotics. Poor proggnosis based on high biomarkers - CRP 23.4, Ferritin >> 7500   P:   Dc plaqueni; DC azithro Convalescent plasma via FDA [facility] EAP protocol - husband consented over phone. 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  P:  CRRT per renal Ok for picc line per renal  ELECTROLYTES A:  hyperkalmiea Acidosis P: crrt bic gtt   GASTROINTESTINAL A:   npo  P:   TF sUP  HEMATOLOGIC A:  Anemia chronic diseas and critical illness   P:  - PRBC for hgb </= 6.9gm%    - exceptions are   -  if ACS susepcted/confirmed then transfuse for hgb </= 8.0gm%,  or    -  active bleeding with hemodynamic instability, then transfuse regardless  of hemoglobin value   At at all times try to transfuse 1 unit prbc as possible with exception of active hemorrhage     ENDOCRINE A:   DM 2 P:   ssi Check vit d  MSK/DERM Hidradenitis hx   Plan  =- abx -wound care    Best practice:  Diet: NPO Pain/Anxiety/Delirium protocol (if indicated): see neir VAP protocol (if indicated): yes DVT  prophylaxis: SQ lovenox GI prophylaxis: Protonix Glucose control: SSI Mobility: bedrest Code Status: FULL code for now - extensive discussion on her very poor prognosis and high mortality rate. He wants everything done. Explained everything is ARDS protocol, prone ventilation, Plasma via research protocol. Off label tocilizumab. He wanted it all despite risks, uncertaintities and lack of robust data but is trending positive in the emerging literature. Recommended strongly NO CPR. He went silent. Will revisit   Family Communication: Husband over phone 05-03-19   Disposition:  ICU   ATTESTATION & SIGNATURE   The patient [name] is critically ill with multiple organ systems failure and requires high complexity decision making for assessment and support, frequent evaluation and titration of therapies, application of advanced monitoring technologies and extensive interpretation of multiple databases.   Critical Care Time devoted to patient care services described in this note is  75  Minutes. This time reflects time of care of this signee Dr Brand Males. This critical care time does not reflect procedure time, or teaching time or supervisory time of PA/NP/Med student/Med Resident etc but could involve care discussion time     Dr. Brand Males, M.D., [facility].C.P Pulmonary and Critical Care Medicine Staff Physician [place] [facility] Pager: [phone], If no answer or between  15:00h - 7:00h: call [phone]  May 03, 2019 9:45 AM   [place] [phone] - fax

## 2019-04-28 NOTE — Discharge Summary (Signed)
DISCHARGE SUMMARY    Date of admit: 04-18-2019  1:03 AM Date of discharge: 2019/04/18  5:30 PM Length of Stay: 1 days  PCP is Harlan Stains, MD   PROBLEM LIST Active Problems:   ARDS (adult respiratory distress syndrome) ([place]) COVID -19     SUMMARY [name] was 58 y.o. patient with    has a past medical history of Chronic diastolic CHF (congestive heart failure) ([place]), DCM (dilated cardiomyopathy) ([place]), Depression, DM type 2 (diabetes mellitus, type 2) ([place]), ESRD on hemodialysis ([place]) (08/21/2017), GERD (gastroesophageal reflux disease), H/O cardiac arrest (10/2012), Hard of hearing (10/2012), History of hemodialysis (dec 2013), HTN (hypertension), Hydradenitis, Hyperkalemia, Hyperlipidemia, Hypothyroidism, Iron deficiency anemia, Morbid obesity ([place]), Multinodular goiter, Neuropathic pain (08/21/2017), Orthostatic hypotension (06/27/2018), PAF (paroxysmal atrial fibrillation) ([place]), Paroxysmal atrial flutter ([place]), Pneumonia (Nov 10, 2012), Sleep apnea, Tinnitus (12/26/2016), Typical atrial flutter ([place]) (04/10/2017), and Urinary tract infection.   has a past surgical history that includes Portacath placement; Cystoscopy w/ ureteral stent placement (05/28/2012); Cystoscopy w/ retrogrades (11/16/2012); Port-a-cath removal; Cystoscopy with retrograde pyelogram, ureteroscopy and stent placement (Right, 06/23/2013); Holmium laser application (N/A, 7/84/6962); AV fistula placement (Left, 07/25/2013); Insertion of dialysis catheter (N/A, 09/24/2013); Cystoscopy with retrograde pyelogram, ureteroscopy and stent placement (Right, 11/06/2013); Revison of arteriovenous fistula (Left, 07/25/2016); IR AV DIALY SHUNT INTRO NEEDLE/INTRACATH INITIAL W/PTA/IMG LEFT (04/19/2018); IR US Guide Vasc Access Left (04/19/2018); IR AV DIALY SHUNT INTRO NEEDLE/INTRAC INITIAL W/PTA/STENT/IMG LEFT (Left, 08/13/2018); IR US Guide Vasc Access Left (08/13/2018); IR AV DIALY SHUNT INTRO NEEDLE/INTRACATH INITIAL W/PTA/IMG  LEFT (08/14/2018); IR US Guide Vasc Access Left (08/14/2018); IR Fluoro Guide CV Line Right (09/17/2018); IR US Guide Vasc Access Right (09/17/2018); IR Fluoro Guide CV Line Right (09/20/2018); Thrombectomy and revision of arterioventous (av) goretex  graft (Left, 10/10/2018); and AV fistula placement (Left, 10/10/2018).   Admitted on 18-Apr-2019 with   58 yo F PMH HTN, HLD, DM2, ESRD on m/w/f iHD, OSA, Afib, diastolic HF, COVID+ on 5/7 who left ED AMA, presents 5/10 with worsening SOB. Patient's husband progressively has been increasing her home O2 (baseline 2L) before calling EMS for transport to ED at [facility]. In ED, patient on 15L NRB and SpO2 88%. CXR reveals bilateral opacities, worse from prior on 5/7. Patient is febrile T 103, WBC 17.3, PCT 17.94, CRP 23.4, Lactic acid 2.9, BNP 640, LDH 304, D-Dimer 2.54. Patient will be intubated in ED and admitted to ICU.    COURSE  4/27 - covid negative - admitted for hidradenitis 5/7> COVID 19 positive, left ED AMA -> atttend HD at home, exposed husband 5/10 >Presents with worsening SOB, intubated, admit to ICU 2019/04/18- very hypoxemic and acidotic. Maintains BP/HR. Renal planning CRRT. Just started vec prn with orders for nimbex, dialudid gtt, versed gtt and nimbex gtt. Husband says exposed but asymptomatic. Husband says patient ill with fever > 11 days prior to admit. Says was on abx for hidradenitis which helped it but there was persistent fever that only resolved last 1-2 days and this correlated with worsening dyspnea and ER admission. Husband says "do everything". Did not appreciate recommendations for DNR - he went quiet on this recommendation after discussing plasma and tocilizumab which he agreed to for research/off-label use. Renal ok with placing picc line   Placed on ARDS protocol, Tocilizumab and ordered convalescent plasma.  She died rapidly from progressive multi-organ failure due to covid-19. She died before getting convalescent  plasma   SIGNED Dr. Brand Males, M.D., [facility].C.P Pulmonary and Critical Care Medicine  Staff [place] [facility] Pager: [phone], If no answer or between  15:00h - 7:00h: call [phone]  04/23/2019 2:11 PM

## 2019-04-28 NOTE — Progress Notes (Addendum)
Patient's blood pressure declining.  2 g of Epi and 2 amps of Bicarb given to patient.  Doctor notified.  Will continue to monitor

## 2019-04-28 NOTE — Progress Notes (Signed)
Patient is PEA.  Attempted to Ascultate heart sounds with no success.  Patient is declared deceased at 43.  Cristin RN verified the abcense of heart sounds with me. Nursing staff was at patient's bedside with patient during her passing.

## 2019-04-28 NOTE — Progress Notes (Signed)
[name] for Lovenox Indication: DVT prophylaxis in COVID patient protocol  Labs: Recent Labs    04/03/19 1240 04/28/2019 0121  04/28/2019 0357 2019/04/28 0603 2019-04-28 0617 04-28-19 0817  HGB 8.7* 9.3*   < > 10.9* 10.5*  --  10.9*  HCT 31.3* 33.1*   < > 32.0* 31.0*  --  32.0*  PLT 237 294  --   --   --   --   --   CREATININE 12.35* 10.68*  --   --   --  11.03*  --    < > = values in this interval not displayed.    Assessment: 15 yof COVID + on 5/7 (left AMA) returns 5/10 with worsening SOB. Pharmacy consulted to dose Lovenox per COVID patient protocol for DVT prophylaxis. Noted, patient is ESRD on HD, now starting CRRT, and weighs 168 kg. D-dimer is <5 (2.54). Patient is not on anticoagulation PTA, currently has heparin SQ ordered. No active bleed issues documented. CBC stable. Noted also on heparin through CRRT circuit.  Goal of Therapy:  VTE prevention Monitor platelets by anticoagulation protocol: Yes   Plan:  D/c SQ Heparin Lovenox 30mg  SQ daily for now >> may need heparin drip with target heparin level 0.1-0.25 if clotting issues with CRRT arise Monitor CBC, s/sx bleeding  Elicia Lamp, PharmD, BCPS Clinical Pharmacist 04-28-2019 10:06 AM

## 2019-04-28 NOTE — Plan of Care (Signed)
I, Dr. Oswald Hillock have personally reviewed patient's available data, including medical history, events of note, physical examination and test results as part of my evaluation. I have discussed with NP Bowser and other care providers.  In addition,  I have personally evaluated patient and assisted in the formulation of the management plan.  S: 58 YO female with history significant for history of ESRD on hemodialysis, chronic diastolic CHF with EF >16% in 01/2019, paroxysmal atrial fibrillation, hidradenitis suppurativa, COPD with chronic hypoxic respiratory failure on 2L of O2 at home , Type II DM, HTN, HLD and morbid obesity who presented with worsening shortness of breath and fever in the setting of COVID19 infection diagnosed 2 days ago  O: Blood pressure (!) 132/51, pulse (!) 114, temperature (!) 103.1 F (39.5 C), temperature source Oral, resp. rate (!) 37, height 5' 6.5" (1.689 m), weight (!) 168 kg, last menstrual period 06/13/2013, SpO2 (!) 88 %.    On my exam: General: Morbidly obese, lying in bed, tachypneic, on NRB HENT: NRB oxygen CVS: Tachycardic XWR:[id number], Obese, abdominal wall hidradenitis RESP: Tachypneic on NRB MSK: Moves all extremites NEURO: Awake, alert  CXR: Independently reviewed and interpreted by me. Bilateral infiltrates concerning for ARDS  Impression: -Acute hypoxemic respiratory failure -COVID 19 Pneumonia -ARDS -ESRD -Chronic diastolic heart failure  -Hypothyroidism -Type II DM  Plan -Patient intubated in the ED -Lung protective ventilation strategies-6cc/kg IBW TV, PEEP, Maintain Plateau <30, Fluid restriction -Will allow for permissive hypercapnia as long as PH remains >7.2 -Check ABG after intubation -VAP prevention bundle -PRN fentanyl for analgesia, maintain RASS of -1. Will initiate drip if patient is agitated --If persistent hypoxia will consider deeper sedation and paralysis. Patient is not a good candidate for proning given body  habitus -Will cover with broad spectrum antibiotics as patient was just recently in the hospital for sepsis thought to be due to cellulitis due to hidradenitis suppurativa and has been on outpatient antibiotics (cefepime and flagyl). Blood culture sent. Obtain tracheal aspirate for culture -Follow cultures and deescalate antibiotics as appropriate -Start Hydroxychloroquine and Azithromycin for COVID19. Monitor QTC with daily EKG -Trend Lactic acid  -Nephrology consult for dialysis with volume removal -Echo to evaluate cardiac function -Continue midodrine 10mg  BID -Continue levothyroxine  Independent Critical Care Time devoted to patient care services described in this note is 65  minutes.   Oswald Hillock M.D. [place] After hours pager: [phone]

## 2019-04-28 NOTE — ED Notes (Signed)
ED Provider at bedside. 

## 2019-04-28 DEATH — deceased

## 2019-09-22 IMAGING — XA IR FLUORO GUIDE CV LINE*R*
1 series · 1 of 1 positions shown · non-contrast
Comparison: Temporary dialysis catheter placement-09/17/2018;

INDICATION: History of end-stage renal disease, currently receiving dialysis via
temporary right internal jugular approach dialysis catheter.

[Series 1: fl (-) angio · 1 of 1 slices shown]
[im 1/1]
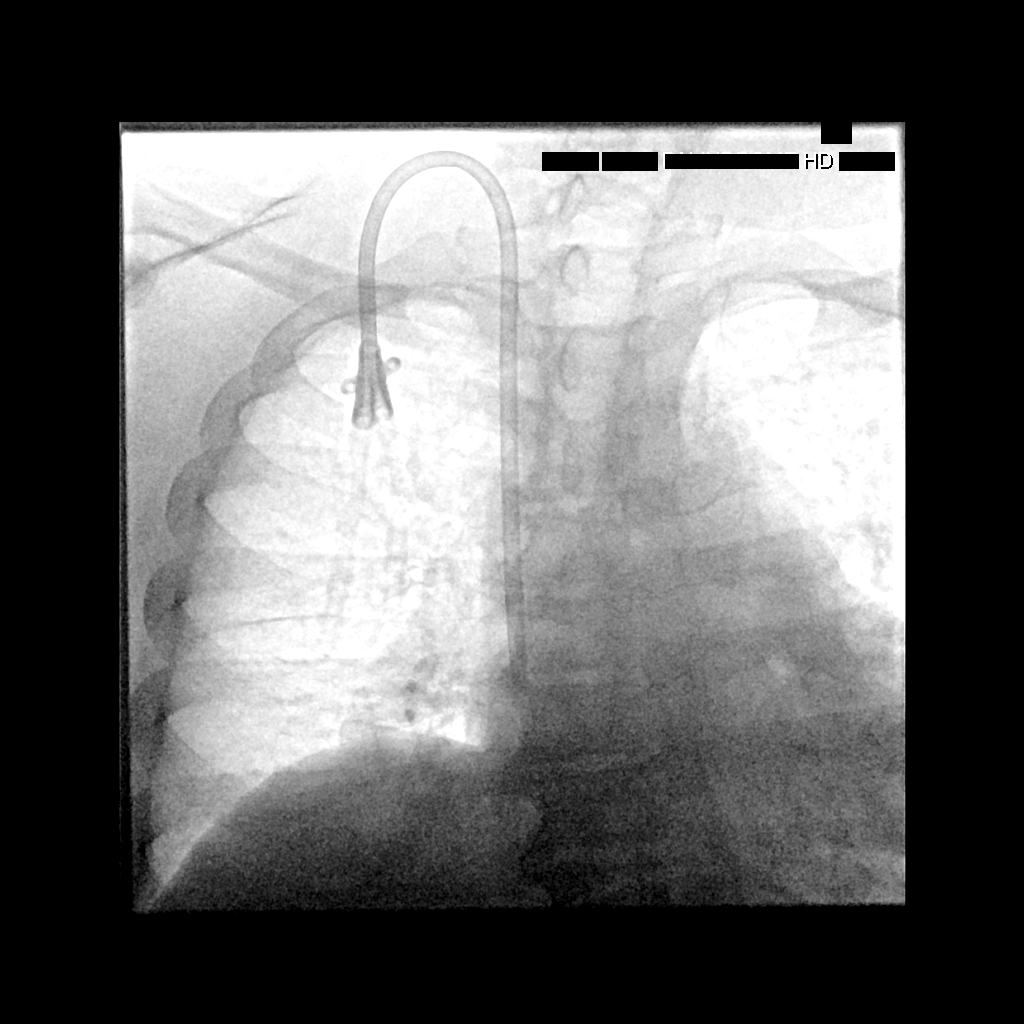

[1 of 1 positions shown; findings below may reference images not displayed]

Patient with left upper extremity AV fistula which was last
intervened upon on both 08/14/2018 and 08/13/2018.

Unfortunately, the patient's left upper arm dialysis fistula has
been clotted for the past week.

Request made for conversion of a temporary dialysis catheter to a
tunneled dialysis catheter.

If able, request made for attempted declot of left upper arm
fistula. Note, patient is unable to lie flat and has tolerated
previous dialysis interventions (including the temporary dialysis
catheter placement) very poorly.

EXAM:
FLUOROSCOPIC GUIDED CONVERSION OF NON TUNNELED TO TUNNELED CENTRAL
VENOUS HEMODIALYSIS CATHETER
left
upper extremity shuntogram with intervention-08/14/2018; 08/13/2018

MEDICATIONS:
None

ANESTHESIA/SEDATION:
Moderate (conscious) sedation was employed during this procedure. A
total of Versed 1 mg and Fentanyl 100 mcg was administered
intravenously.

Moderate Sedation Time: 14 minutes. The patient's level of
consciousness and vital signs were monitored continuously by
radiology nursing throughout the procedure under my direct
supervision.

FLUOROSCOPY TIME:  18 seconds (4 mGy)

COMPLICATIONS:
None immediate.

PROCEDURE:
Informed written consent was obtained from the patient after a
discussion of the risks, benefits, and alternatives to treatment.
Questions regarding the procedure were encouraged and answered. The
skin and external portion of the existing hemodialysis catheter was
prepped with chlorhexidine in a sterile fashion, and a sterile drape
was applied covering the operative field. Maximum barrier sterile
technique with sterile gowns and gloves were used for the procedure.
A timeout was performed prior to the initiation of the procedure.

A lumen of the none tunneled temporary dialysis catheter was
cannulated with a stiff Glidewire in utilized for measurement
purposes. Next, the Glidewire was advanced to the level of the IVC.
A 19 cm tip to cuff palindrome dialysis catheter was tunneled from a
site along the anterior chest to the venotomy site.

Under intermittent fluoroscopic guidance, the temporary dialysis
catheter was exchanged for a peel-away sheath. The tunneled dialysis
catheter was inserted into the peel-away sheath with tips open
terminating within in the superior aspect the right atrium. Final
catheter positioning was confirmed and documented with a spot
fluoroscopic image. The catheter aspirates and flushes normally. The
catheter was flushed with appropriate volume heparin dwells.

The catheter exit site was secured with a 0-Prolene retention
sutures. The venotomy site was apposed with derma bond and
Eduj. Both lumens were heparinized. A dressing was placed.
The patient tolerated the procedure well without immediate post
procedural complication.

Prolonged conversations were held patient regarding potential
intervention on thrombosed left upper arm fistula however following
this prolonged detailed conversation, the patient does not feel she
would be able to tolerate attempted declot procedure.
IMPRESSION: Successful fluoroscopic guided conversion of a temporary to a
permanent 19 cm tip to cuff tunneled hemodialysis catheter with tips
terminating within the right atrium. The catheter is ready for
immediate use.
